# Patient Record
Sex: Female | Born: 1955
Health system: Southern US, Community
[De-identification: ages and names within clinical notes are randomized; demographics above are authoritative.]

## PROBLEM LIST (undated history)

## (undated) DIAGNOSIS — I10 Essential (primary) hypertension: Secondary | ICD-10-CM

## (undated) DIAGNOSIS — Z9981 Dependence on supplemental oxygen: Secondary | ICD-10-CM

## (undated) DIAGNOSIS — E039 Hypothyroidism, unspecified: Secondary | ICD-10-CM

## (undated) DIAGNOSIS — M419 Scoliosis, unspecified: Secondary | ICD-10-CM

## (undated) DIAGNOSIS — M1990 Unspecified osteoarthritis, unspecified site: Secondary | ICD-10-CM

## (undated) DIAGNOSIS — G43909 Migraine, unspecified, not intractable, without status migrainosus: Secondary | ICD-10-CM

## (undated) DIAGNOSIS — J449 Chronic obstructive pulmonary disease, unspecified: Secondary | ICD-10-CM

## (undated) DIAGNOSIS — D649 Anemia, unspecified: Secondary | ICD-10-CM

## (undated) DIAGNOSIS — Z8719 Personal history of other diseases of the digestive system: Secondary | ICD-10-CM

## (undated) DIAGNOSIS — B019 Varicella without complication: Secondary | ICD-10-CM

## (undated) DIAGNOSIS — R519 Headache, unspecified: Secondary | ICD-10-CM

## (undated) DIAGNOSIS — E119 Type 2 diabetes mellitus without complications: Secondary | ICD-10-CM

## (undated) DIAGNOSIS — M549 Dorsalgia, unspecified: Secondary | ICD-10-CM

## (undated) DIAGNOSIS — G8929 Other chronic pain: Secondary | ICD-10-CM

## (undated) DIAGNOSIS — J309 Allergic rhinitis, unspecified: Secondary | ICD-10-CM

## (undated) DIAGNOSIS — F32A Depression, unspecified: Secondary | ICD-10-CM

## (undated) DIAGNOSIS — C3490 Malignant neoplasm of unspecified part of unspecified bronchus or lung: Secondary | ICD-10-CM

## (undated) DIAGNOSIS — K219 Gastro-esophageal reflux disease without esophagitis: Secondary | ICD-10-CM

## (undated) DIAGNOSIS — E785 Hyperlipidemia, unspecified: Secondary | ICD-10-CM

## (undated) DIAGNOSIS — F419 Anxiety disorder, unspecified: Secondary | ICD-10-CM

## (undated) DIAGNOSIS — F329 Major depressive disorder, single episode, unspecified: Secondary | ICD-10-CM

## (undated) DIAGNOSIS — R51 Headache: Secondary | ICD-10-CM

## (undated) HISTORY — PX: ULNAR NERVE TRANSPOSITION: SHX2595

## (undated) HISTORY — DX: Allergic rhinitis, unspecified: J30.9

## (undated) HISTORY — DX: Type 2 diabetes mellitus without complications: E11.9

## (undated) HISTORY — DX: Other chronic pain: G89.29

## (undated) HISTORY — DX: Malignant neoplasm of unspecified part of unspecified bronchus or lung: C34.90

## (undated) HISTORY — DX: Hyperlipidemia, unspecified: E78.5

## (undated) HISTORY — DX: Depression, unspecified: F32.A

## (undated) HISTORY — DX: Essential (primary) hypertension: I10

## (undated) HISTORY — PX: APPENDECTOMY: SHX54

## (undated) HISTORY — DX: Dorsalgia, unspecified: M54.9

## (undated) HISTORY — DX: Personal history of other diseases of the digestive system: Z87.19

## (undated) HISTORY — DX: Varicella without complication: B01.9

## (undated) HISTORY — DX: Major depressive disorder, single episode, unspecified: F32.9

## (undated) HISTORY — PX: TONSILLECTOMY: SUR1361

## (undated) HISTORY — DX: Migraine, unspecified, not intractable, without status migrainosus: G43.909

## (undated) HISTORY — PX: TOTAL ABDOMINAL HYSTERECTOMY: SHX209

## (undated) HISTORY — PX: CARPAL TUNNEL RELEASE: SHX101

## (undated) HISTORY — DX: Anxiety disorder, unspecified: F41.9

## (undated) HISTORY — PX: TOTAL HIP ARTHROPLASTY: SHX124

## (undated) HISTORY — PX: CHOLECYSTECTOMY: SHX55

---

## 1989-01-28 HISTORY — PX: GALLBLADDER SURGERY: SHX652

## 2007-11-05 ENCOUNTER — Ambulatory Visit (HOSPITAL_COMMUNITY): Admission: RE | Admit: 2007-11-05 | Discharge: 2007-11-05 | Payer: Self-pay | Admitting: Orthopedic Surgery

## 2008-03-17 ENCOUNTER — Inpatient Hospital Stay (HOSPITAL_COMMUNITY): Admission: RE | Admit: 2008-03-17 | Discharge: 2008-03-19 | Payer: Self-pay | Admitting: Orthopedic Surgery

## 2008-03-19 ENCOUNTER — Encounter: Payer: Self-pay | Admitting: Cardiology

## 2008-04-19 ENCOUNTER — Ambulatory Visit (HOSPITAL_COMMUNITY): Admission: RE | Admit: 2008-04-19 | Discharge: 2008-04-19 | Payer: Self-pay | Admitting: Orthopedic Surgery

## 2008-04-19 ENCOUNTER — Encounter (INDEPENDENT_AMBULATORY_CARE_PROVIDER_SITE_OTHER): Payer: Self-pay | Admitting: Orthopedic Surgery

## 2008-04-19 ENCOUNTER — Ambulatory Visit: Payer: Self-pay | Admitting: Vascular Surgery

## 2008-09-21 ENCOUNTER — Encounter: Payer: Self-pay | Admitting: Cardiology

## 2008-09-22 ENCOUNTER — Encounter: Payer: Self-pay | Admitting: Cardiology

## 2008-11-16 DIAGNOSIS — M8949 Other hypertrophic osteoarthropathy, multiple sites: Secondary | ICD-10-CM | POA: Insufficient documentation

## 2008-11-16 DIAGNOSIS — I10 Essential (primary) hypertension: Secondary | ICD-10-CM | POA: Insufficient documentation

## 2008-11-16 DIAGNOSIS — M159 Polyosteoarthritis, unspecified: Secondary | ICD-10-CM

## 2008-11-16 DIAGNOSIS — F411 Generalized anxiety disorder: Secondary | ICD-10-CM | POA: Insufficient documentation

## 2008-11-16 DIAGNOSIS — R0789 Other chest pain: Secondary | ICD-10-CM

## 2008-11-17 ENCOUNTER — Ambulatory Visit: Payer: Self-pay | Admitting: Cardiology

## 2008-11-17 ENCOUNTER — Encounter (INDEPENDENT_AMBULATORY_CARE_PROVIDER_SITE_OTHER): Payer: Self-pay | Admitting: *Deleted

## 2008-11-17 ENCOUNTER — Encounter: Payer: Self-pay | Admitting: Physician Assistant

## 2008-11-17 DIAGNOSIS — E785 Hyperlipidemia, unspecified: Secondary | ICD-10-CM

## 2008-11-23 ENCOUNTER — Ambulatory Visit: Payer: Self-pay | Admitting: Cardiovascular Disease

## 2008-11-23 ENCOUNTER — Inpatient Hospital Stay (HOSPITAL_BASED_OUTPATIENT_CLINIC_OR_DEPARTMENT_OTHER): Admission: RE | Admit: 2008-11-23 | Discharge: 2008-11-23 | Payer: Self-pay | Admitting: Cardiovascular Disease

## 2008-11-23 ENCOUNTER — Encounter (INDEPENDENT_AMBULATORY_CARE_PROVIDER_SITE_OTHER): Payer: Self-pay | Admitting: *Deleted

## 2008-12-12 ENCOUNTER — Ambulatory Visit: Payer: Self-pay | Admitting: Cardiology

## 2008-12-12 DIAGNOSIS — F17210 Nicotine dependence, cigarettes, uncomplicated: Secondary | ICD-10-CM

## 2010-02-18 ENCOUNTER — Encounter: Payer: Self-pay | Admitting: Orthopedic Surgery

## 2010-05-15 LAB — CBC
HCT: 31.1 % — ABNORMAL LOW (ref 36.0–46.0)
Hemoglobin: 13.2 g/dL (ref 12.0–15.0)
Hemoglobin: 10.2 g/dL — ABNORMAL LOW (ref 12.0–15.0)
Hemoglobin: 9.7 g/dL — ABNORMAL LOW (ref 12.0–15.0)
MCV: 89.6 fL (ref 78.0–100.0)
MCV: 88 fL (ref 78.0–100.0)
MCV: 88.9 fL (ref 78.0–100.0)
Platelets: 160 10*3/uL (ref 150–400)
Platelets: 172 10*3/uL (ref 150–400)
RBC: 4.46 MIL/uL (ref 3.87–5.11)
RBC: 3.28 MIL/uL — ABNORMAL LOW (ref 3.87–5.11)
RDW: 14.8 % (ref 11.5–15.5)
WBC: 9.5 10*3/uL (ref 4.0–10.5)
WBC: 10.8 10*3/uL — ABNORMAL HIGH (ref 4.0–10.5)

## 2010-05-15 LAB — BASIC METABOLIC PANEL
BUN: 11 mg/dL (ref 6–23)
BUN: 4 mg/dL — ABNORMAL LOW (ref 6–23)
BUN: 9 mg/dL (ref 6–23)
CO2: 31 mEq/L (ref 19–32)
CO2: 26 mEq/L (ref 19–32)
CO2: 28 mEq/L (ref 19–32)
Calcium: 9.4 mg/dL (ref 8.4–10.5)
Calcium: 7.6 mg/dL — ABNORMAL LOW (ref 8.4–10.5)
Chloride: 104 mEq/L (ref 96–112)
Chloride: 104 mEq/L (ref 96–112)
Creatinine, Ser: 0.82 mg/dL (ref 0.4–1.2)
GFR calc non Af Amer: >60 mL/min (ref >60)
GFR calc non Af Amer: >60 mL/min (ref >60)
Glucose, Bld: 105 mg/dL — ABNORMAL HIGH (ref 70–99)
Glucose, Bld: 121 mg/dL — ABNORMAL HIGH (ref 70–99)
Potassium: 2.9 mEq/L — ABNORMAL LOW (ref 3.5–5.1)
Sodium: 146 mEq/L — ABNORMAL HIGH (ref 135–145)
Sodium: 134 mEq/L — ABNORMAL LOW (ref 135–145)
Sodium: 137 mEq/L (ref 135–145)

## 2010-05-15 LAB — TYPE AND SCREEN
ABO/RH(D): O NEG
Antibody Screen: NEGATIVE

## 2010-05-15 LAB — URINALYSIS, ROUTINE W REFLEX MICROSCOPIC: Bilirubin Urine: NEGATIVE

## 2010-05-15 LAB — DIFFERENTIAL
Basophils Absolute: 0.1 10*3/uL (ref 0.0–0.1)
Basophils Relative: 1 % (ref 0–1)
Eosinophils Absolute: 1.3 10*3/uL — ABNORMAL HIGH (ref 0.0–0.7)
Lymphocytes Relative: 30 % (ref 12–46)
Neutrophils Relative %: 48 % (ref 43–77)

## 2010-05-15 LAB — APTT: aPTT: 29 seconds (ref 24–37)

## 2010-06-12 NOTE — Op Note (Signed)
[name], [name] NO.:  [id number]   MEDICAL RECORD NO.:  [id number]          PATIENT TYPE:  INP   LOCATION:  0001                         FACILITY:  [facility]   PHYSICIAN:  Madlyn Frankel. Charlann Boxer, M.D.  DATE OF BIRTH:  30-Dec-1955   DATE OF PROCEDURE:  03/17/2008  DATE OF DISCHARGE:                               OPERATIVE REPORT   PREOPERATIVE DIAGNOSIS:  Failed right total hip replacement with  persistent pain.   POSTOPERATIVE DIAGNOSIS:  Failed right total hip replacement with  persistent pain.   FINDINGS:  There was no concern for infection, synovial fluid appeared  normal.  Intraoperative findings did find a vertically oriented  acetabular shell at least 50 degrees of abduction if not more with only  probably 5 degrees of anteversion.  Forward flexion in the femoral stem  was very stable.  There was no evidence of trochanteric fracture of  mobility.  There was no evidence of any micromotion with gross motion of  the stem.   PROCEDURE:  Revision right total hip replacement with a 56 Gription  pinnacle cup, two cancellous screws and a 40 mm neutral liner and a 40+  8.5 Delta ceramic ball.   SURGEON:  Dr. Charlann Boxer.   ASSISTANT:  Dwyane Luo, PA-C.   ANESTHESIA:  General.   BLOOD LOSS:  250.   DRAINS:  One Hemovac.   COMPLICATIONS:  None.   INDICATIONS FOR PROCEDURE:  [name] is 55 year old female I have seen  in evaluation for persistent right hip pain after a hip replacements was  placed in 2006.  She has never really felt relief from her hip pain.  She reports both pain in the groin down into the thigh area and workup  for infection which was negative.  Bone scan indicated some uptake in  the proximal aspect of the hip area.  There was no evidence of obvious  loosening of the femoral stem.  I had an extensive discussion with Ms.  [name] in the office and then earlier in the night before surgery  about the plan.  I was a little bit worried a 55 year old  female  removing a well fixed fully porous coated prosthesis with potential  destruction of the bone.  We initially thought that removing the  prosthesis would be of benefit to try to place a proximal coasted  prosthesis if that there was concern about loosening.  The initial  workup was concerning for femoral stem loosening.  After extensive  discussion about the potential plans and the risks and benefits, the  risks of infection, DVT, dislocation, need for revision surgery,  persistent pain were all discussed and reviewed and consent was  obtained.   PROCEDURE IN DETAIL:  The patient was brought to the operative theater.  Once adequate anesthesia and preoperative antibiotics, clindamycin  administered, the patient was positioned in left lateral decubitus  position with the right-side up.  The right lower extremity was then pre-  scrubbed and prepped and draped in a sterile fashion.  A timeout  performed identifying the patient and the extremity.  The patient was  noted to have a pretty much laterally oriented incision. I used a  portion of this, but also extended a little bit more posterior to help  with exposure from the revision stem, particularly on the acetabular  side.  Sharp dissection was carried down to the iliotibial band and  gluteal fascia.  Initially, I exposed the hip from a posterior approach  as opposed to full expose of the femur.  The pseudocapsular tissue was  taken down off the proximal posterior aspect of femur down to the level  of the lesser trochanter.  Inside the joint, the joint fluid was normal.  There was noted to be debulking of the entire synovium inside the hip  joint.   There was no significant debris.   It was immediately evident upon entering the hip joint that the hip had  little to mod anteversion.  The femoral stem had about 5 degrees of  anteversion and the acetabulum cup about the same.  The acetabular cup  was noted to be vertically oriented.   At this point, the hip was  dislocated easily and the femoral head was removed.  Based on the  dissection and debridement, I was able to place the trunnion of the stem  onto the proximal anterior aspect of the ilium.  With this, I had  adequate visualization of the rim of the acetabulum.  I checked with the  hip guidance confirming that there was only about 5 degrees of forward  flexion, as well as a significantly oriented, at least 50-55 degree  oriented cup.   At this point, using a DePuy pinnacle suction cup with a metal liner, I  used a bone tamp and wrapped the outside of the cup associating the  metal taper.  The liner was removed.  The hole eliminator were removed  and a 32 neutral liner placed.  With this, I used the Zimmer Explant  system with the short and long blades to removed the cup.  The cup was  removed with very little bone loss.   At this point, I began reaming with a 45 reamer.  I wanted to ream down  a little bit medial.  Then I reamed up sequentially until the 54 reamer  where I thought the 55 mm gave me the best fit.  I was really worried  that the 53 reamer and 54 cup may not have the best purchase and my  final reamings removed very little bone.  The final 56 pinnacle cup was  then impacted.  With the new orientation, there was at least 20-25  degrees of forward flexion beneath the anterior rim anteriorly with a  portion of the cup exposed superolateral.   With the cup in its new orientation, I placed two screws and then placed  a neutral 41 liner and a trial.  At this point, the clinical decision  was made to not remove the femoral stem as there was no motion of the  stem.  The femur appeared  to be well-fixed with at least  5 degrees of  anteversion.  I thought her cup position could have been the source for  her paindue to paossible impingement and complications due to edge  loading.  A trial reduction at this point revealed a combined  anteversion of 40-45  degrees.  The hip was very stable.  Initially, I  trialed with a 1.5 ball, but there was a few millimeters of shuck with  this even in extension.  I  went up to a +5, and there still a few  millimeters of shuck.  At this point, I removed the trial components  including the trial liner.  A 40 mm neutral metal liner was then  impacted after placement of the new hole eliminator onto a dried shell.  I re-trialed at this point and ended up choosing a Delta ceramic ball  40+ 8.5, for a hard bearing surface, but to try to eliminate metal-on-  metal in this edge-loading phenomenon.  At this point with the ceramic  on metal insert, hopefully there will be less ion debris and also  provide her with a hip that may last her the longest time as possible.  The final ball was impacted on a dry trunnion and the hip reduced.  The  hip was irrigated throughout the case.  At this point, we had used Cell  Saver and in an attempt to salvage any blood if it was needed.  There  was only 250 mL of blood loss and no salvageable blood return.  I  reapproximated some of the pseudocapsule to the superior aspect of  itself using #1 Vicryl.  I placed a medium Hemovac drain deep.  I then  reapproximated the iliotibial band and gluteal fascia using #1 Vicryl  and used  2-0 Vicryl on the subcu layer and 4-0 running Monocryl.  The hip was  cleaned, dried and dressed sterilely with Steri-Strips and Mepilex  dressing.  She was brought to the recovery room and extubated in stable  condition.  She tolerated the procedure well.      Madlyn Frankel Charlann Boxer, M.D.  Electronically Signed     MDO/MEDQ  D:  03/17/2008  T:  03/17/2008  Job:  [id number]

## 2010-06-12 NOTE — Discharge Summary (Signed)
[name], [name]   MEDICAL RECORD NO.:  [id number]          PATIENT TYPE:  INP   LOCATION:  1608                         FACILITY:  [facility]   PHYSICIAN:  Madlyn Frankel. Charlann Boxer, M.D.  DATE OF BIRTH:  1955/05/07   DATE OF ADMISSION:  03/17/2008  DATE OF DISCHARGE:  03/19/2008                               DISCHARGE SUMMARY   ADMITTING DIAGNOSES:  1. Osteoarthritis.  2. Hypertension.  3. Anxiety.  4. Migraines.  5. Urinary incontinence.  6. Postmenopausal.   DISCHARGE DIAGNOSES:  1. Osteoarthritis.  2. Hypertension.  3. Anxiety.  4. Migraines.  5. Urinary incontinence.  6. Postmenopausal.  7. Postoperative hypokalemia.   HISTORY OF PRESENT ILLNESS:  A 55 year old female with a history of  right total hip placement with evidence of loosening.  Refractory to all  conservative treatment.   CONSULTS:  None.   PROCEDURE:  Revision right total hip replacement, surgeon Durene Romans,  MD, assistant Dwyane Luo PA-C.   LABORATORY DATA:  CBC final reading white blood cell 10.8, hematocrit  29.2, platelets 160.  White cell differential:  No significant  abnormalities.  Coagulation normal.  Metabolic panel final reading:  Sodium 134, potassium 2.9, glucose 128, BUN 4, creatinine 0.82.  Calcium  was 7.6.  UA was negative.   CARDIOLOGY:  Electrocardiogram normal sinus rhythm.   RADIOLOGY:  Chest two-view:  No acute disease.  Portable pelvis:  Revision of a right total hip without complication.   HOSPITAL COURSE:  The patient underwent revision right total hip  placement, admitted to orthopedic floor.  Her stay was unremarkable.  Dressing was changed with no significant drainage from the wound.  She  was partial weightbearing 50% with improving strength and ambulation.  She remained orthopedically stable.  She had a low potassium which was  replaced.  By the 20th she was stable, making good progress and ready  for discharge home.   DISCHARGE  DISPOSITION:  Discharged home with home health care PT in  stable and improved condition.   DISCHARGE DIET:  Heart-healthy.   DISCHARGE WOUND CARE:  Keep dry.   DISCHARGE PHYSICAL THERAPY:  Partial weightbearing 50% with a rolling  walker.   DISCHARGE MEDICATIONS:  1. Aspirin 325 mg p.o. 2 times daily.  2. Robaxin 500 mg p.o. q.6.  3. Iron 325 mg p.o. 3 times daily.  4. Colace 100 mg p.o. 2 times daily.  5. MiraLax 17 grams p.o. daily.  6. Norco 5/325 one to two p.o. q.4-6 p.r.n. pain.  7. K-Dur 20 mEq p.o. daily x2 weeks.  8. Alprazolam 0.5 mg p.r.n.  9. Omeprazole 20 mg p.o. daily.  10.Triamterene/HCTZ 37.5/25 one p.o. daily.  11.Estropipate 1.5 mg p.o. daily.  12.Vitamin C 500 mg p.o. daily.   DISCHARGE FOLLOWUP:  Follow up with Dr. Charlann Boxer at phone number [phone] in  2 weeks for wound check.     ______________________________  Yetta Glassman. [address], [place]      Madlyn Frankel. Charlann Boxer, M.D.  Electronically Signed    BLM/MEDQ  D:  04/11/2008  T:  04/11/2008  Job:  [id number]  cc:   Wyvonnia Lora  Fax: [phone]

## 2010-06-15 NOTE — H&P (Signed)
[name], [name]   MEDICAL RECORD NO.:  [id number]          PATIENT TYPE:  INP   LOCATION:                               FACILITY:  [facility]   PHYSICIAN:  Madlyn Frankel. Charlann Boxer, M.D.  DATE OF BIRTH:  [birth year]   DATE OF ADMISSION:  03/17/2008  DATE OF DISCHARGE:                              HISTORY & PHYSICAL   DATE OF ADMISSION:  March 17, 2008.   OPERATION/PROCEDURE:  Revision right total hip replacement.   CHIEF COMPLAINTS:  Right hip pain due to failed right total hip  replacement.   HISTORY OF PRESENT ILLNESS:  A 55 year old female with a history of  right total hip replacement which is painful with evidence of loosening.  Been refractory to all conservative treatment.   MEDICAL HISTORY:  1. Osteoarthritis.  2. Hypertension.  3. Anxiety.  4. Migraines.  5. Urinary incontinence.  6. Postmenopausal.   PAST SURGERIES:  1. Tonsillectomy.  2. Tubal ligation.  3. Hysterectomy.  4. Cholecystectomy.  5. Left ovary removed in 2003.  6. Primary hip replacement in 2006.   FAMILY HISTORY:  Osteoarthritis, congestive heart failure, COPD, CVA.   SOCIAL HISTORY:  Married.  Smokes 1-1/2  to 2 packs per day for the past  25 years which will be an approximate 50 pack-year history.  Primary  caregiver will be her husband at home postoperatively.   ALLERGIES:  PENICILLIN, CODEINE, SULFA, unknown reaction.   CURRENT MEDICATIONS:  1. Triamterene/HCTZ 37.5/25 one p.o. daily.  2. Estrapade 1.5 daily.  3. Omprazole 20 mg one p.o. daily.  4. Darvocet 1 to 2 p.o. t.i.d.  5. Xanax 0.25 mg p.o. p.r.n.Marland Kitchen   REVIEW OF SYSTEMS:  MUSCULOSKELETAL:  She does have left foot which is  painful.  GENITOURINARY:  She has incontinence issues.  Otherwise see  HPI.   PHYSICAL EXAMINATION:  VITAL SIGNS:  Pulse 72, respirations 16, blood  pressure 130/84.  GENERAL:  Awake, alert and oriented.  HEENT: Normocephalic.  NECK:  Supple.  No carotid bruits.  CHEST/LUNGS:  Clear to auscultation bilaterally.  BREASTS:  Deferred.  HEART:  Regular rate and rhythm.  S1 and S2 distinct.  ABDOMEN:  Soft, bowel sounds present.  PELVIS:  Stable.  GENITOURINARY:  Deferred.  EXTREMITIES:  Right hip has increased pain with range of motion.  SKIN:  No cellulitis.  NEUROLOGIC:  Intact distal sensibilities.   LABORATORY DATA:  EKG, chest x-ray all pending presurgical testing.   IMPRESSION:  Failed right total hip replacement.   PLAN OF ACTION:  Revision right total hip replacement at [facility] by surgeon Dr. Durene Romans on March 17, 2008.  Risks and  complications were discussed.   Postop medications were provided including aspirin for postoperative DVT  prophylaxis.     ______________________________  Yetta Glassman [address], [place]      Madlyn Frankel. Charlann Boxer, M.D.  Electronically Signed    BLM/MEDQ  D:  02/24/2008  T:  02/24/2008  Job:  [id number]   cc:   Wyvonnia Lora  Fax: [phone]

## 2011-02-10 ENCOUNTER — Encounter (HOSPITAL_COMMUNITY): Payer: Self-pay | Admitting: *Deleted

## 2011-02-10 ENCOUNTER — Emergency Department (HOSPITAL_COMMUNITY)
Admission: EM | Admit: 2011-02-10 | Discharge: 2011-02-10 | Disposition: A | Discharge status: Home / Self Care | Payer: PRIVATE HEALTH INSURANCE | Attending: Emergency Medicine | Admitting: Emergency Medicine

## 2011-02-10 DIAGNOSIS — M545 Low back pain, unspecified: Secondary | ICD-10-CM | POA: Insufficient documentation

## 2011-02-10 DIAGNOSIS — I10 Essential (primary) hypertension: Secondary | ICD-10-CM | POA: Insufficient documentation

## 2011-02-10 DIAGNOSIS — M25551 Pain in right hip: Secondary | ICD-10-CM

## 2011-02-10 DIAGNOSIS — Z79899 Other long term (current) drug therapy: Secondary | ICD-10-CM | POA: Insufficient documentation

## 2011-02-10 DIAGNOSIS — S335XXA Sprain of ligaments of lumbar spine, initial encounter: Secondary | ICD-10-CM | POA: Insufficient documentation

## 2011-02-10 DIAGNOSIS — M25559 Pain in unspecified hip: Secondary | ICD-10-CM | POA: Insufficient documentation

## 2011-02-10 DIAGNOSIS — T733XXA Exhaustion due to excessive exertion, initial encounter: Secondary | ICD-10-CM | POA: Insufficient documentation

## 2011-02-10 DIAGNOSIS — S39012A Strain of muscle, fascia and tendon of lower back, initial encounter: Secondary | ICD-10-CM

## 2011-02-10 DIAGNOSIS — M1990 Unspecified osteoarthritis, unspecified site: Secondary | ICD-10-CM | POA: Insufficient documentation

## 2011-02-10 HISTORY — DX: Unspecified osteoarthritis, unspecified site: M19.90

## 2011-02-10 MED ORDER — METHOCARBAMOL 750 MG PO TABS: ORAL_TABLET | ORAL | Status: DC

## 2011-02-10 NOTE — ED Notes (Signed)
Pt states has a history of chronic back pain, is prescribed hydrocodone 10/650 mg from pain clinic and reports pain medication has not touched back pain today. Pt reports walking up stadium steps Friday and back has been aggravated since. Pain radiates to rt leg.

## 2011-02-10 NOTE — ED Provider Notes (Signed)
History     CSN: [id number]  Arrival date & time 02/10/11  1113   First MD Initiated Contact with Patient 02/10/11 1251      Chief Complaint  Patient presents with  . Back Pain    (Consider location/radiation/quality/duration/timing/severity/associated sxs/prior treatment) HPI Comments: Pt has had low back pain and R hip pain since climbing some bleachers recently at a sporting event.  No trauma.  Patient is a 56 y.o. female presenting with back pain. The history is provided by the patient and the spouse. No language interpreter was used.  Back Pain  This is a new problem. The problem occurs constantly. The pain is present in the lumbar spine.    Past Medical History  Diagnosis Date  . Back pain   . Osteoarthritis   . Status post hip replacement   . Hypertension     Past Surgical History  Procedure Date  . Hip surgery     with hip replacement and revision    History reviewed. No pertinent family history.  History  Substance Use Topics  . Smoking status: Current Everyday Smoker  . Smokeless tobacco: Not on file  . Alcohol Use: No    OB History    Grav Para Term Preterm Abortions TAB SAB Ect Mult Living                  Review of Systems  Musculoskeletal: Positive for back pain.       Hip pain  All other systems reviewed and are negative.    Allergies  Penicillins and Sulfonamide derivatives  Home Medications   Current Outpatient Rx  Name Route Sig Dispense Refill  . ALPRAZOLAM 1 MG PO TABS Oral Take 1 mg by mouth 4 (four) times daily.    Marland Kitchen CITALOPRAM HYDROBROMIDE 40 MG PO TABS Oral Take 40 mg by mouth daily.    Marland Kitchen HYDROCODONE-ACETAMINOPHEN 10-650 MG PO TABS Oral Take 1 tablet by mouth every 6 (six) hours as needed. Pain    . IBUPROFEN 200 MG PO TABS Oral Take 400 mg by mouth every 6 (six) hours as needed. Pain    . NABUMETONE 500 MG PO TABS Oral Take 500 mg by mouth 2 (two) times daily.    Marland Kitchen OMEPRAZOLE 20 MG PO CPDR Oral Take 20 mg by mouth daily.     . TRIAMTERENE-HCTZ 37.5-25 MG PO CAPS Oral Take 1 capsule by mouth every morning.      BP 130/80  Pulse 64  Temp(Src) 98.3 F (36.8 C) (Oral)  Resp 20  Ht 5\' 5"  (1.651 m)  Wt 200 lb (90.719 kg)  BMI 33.28 kg/m2  SpO2 95%  Physical Exam  Nursing note and vitals reviewed. Constitutional: She is oriented to person, place, and time. She appears well-developed and well-nourished. No distress.  HENT:  Head: Normocephalic and atraumatic.  Eyes: EOM are normal.  Neck: Normal range of motion.  Cardiovascular: Normal rate, regular rhythm and normal heart sounds.   Pulmonary/Chest: Effort normal and breath sounds normal.  Abdominal: Soft. She exhibits no distension. There is no tenderness.  Musculoskeletal: She exhibits tenderness.       Right hip: She exhibits decreased range of motion. She exhibits no tenderness, no bony tenderness, no swelling, no crepitus, no deformity and no laceration.       Arms:      Legs: Neurological: She is alert and oriented to person, place, and time.  Skin: Skin is warm and dry.  Psychiatric: She has a  normal mood and affect. Judgment normal.    ED Course  Procedures (including critical care time)  Labs Reviewed - No data to display No results found.   No diagnosis found.    MDM          Worthy Rancher, PA 02/10/11 1343

## 2011-02-10 NOTE — ED Notes (Signed)
Pt states that she has chronic back problems but pain became worse Friday evening, denies any new injury,

## 2011-02-11 NOTE — ED Provider Notes (Signed)
Medical screening examination/treatment/procedure(s) were performed by non-physician practitioner and as supervising physician I was immediately available for consultation/collaboration.   Joya Gaskins, MD 02/11/11 2052

## 2012-08-20 ENCOUNTER — Ambulatory Visit (INDEPENDENT_AMBULATORY_CARE_PROVIDER_SITE_OTHER): Payer: Managed Care, Other (non HMO) | Admitting: Orthopedic Surgery

## 2012-08-20 ENCOUNTER — Encounter: Payer: Self-pay | Admitting: Orthopedic Surgery

## 2012-08-20 DIAGNOSIS — M6530 Trigger finger, unspecified finger: Secondary | ICD-10-CM | POA: Insufficient documentation

## 2012-08-20 DIAGNOSIS — M7070 Other bursitis of hip, unspecified hip: Secondary | ICD-10-CM | POA: Insufficient documentation

## 2012-08-20 DIAGNOSIS — M7071 Other bursitis of hip, right hip: Secondary | ICD-10-CM

## 2012-08-20 DIAGNOSIS — M76899 Other specified enthesopathies of unspecified lower limb, excluding foot: Secondary | ICD-10-CM

## 2012-08-20 NOTE — Progress Notes (Signed)
Patient ID: [name], female   DOB: [birth year], 56 y.o.   MRN: [id number] Chief Complaint  Patient presents with  . Hand Pain    She is having triggering of the right long finger with catching  . Hip Pain    Gen. revision right total hip and complains of a snapping hip     History the patient had a total hip and then had a revision by Dr. Charlann [name] in [place] at [facility] where she usually gets most of her orthopedic care. She was in the office with her son and complained of a snapping hip and a triggering finger and I offered to help her with that and she agreed. She has locking and catching and pain over the A1 pulley of the right middle finger. She has snapping hip sensation on the lateral side of the hip every time she walks she does not complaining of groin pain back pain or buttock pain. Her pain in the finger has reached 8/10 it is associated with locking  As far as the hip goes test is described as sharp and burning laterally  Review of systems history diarrhea, numbness and tingling. Anxiety and depression. Seasonal allergies. Joint pain, instability muscle pain. All the systems were reviewed and were normal.  Past Medical History  Diagnosis Date  . Back pain   . Osteoarthritis   . Status post hip replacement   . Hypertension    Past Surgical History  Procedure Laterality Date  . Hip surgery      with hip replacement and revision   The past, family history and social history have been reviewed and are recorded in the corresponding sections of epic   General appearance the patient has mild obesity otherwise normal appearance, oriented x3, mood and affect normal, she ambulates with a somewhat altered gait and I cannot palpate any snapping as I walked down the hall with her with my hand over her hip. Inspection of the hip reveals 2 incisions they interconnect distally and separated proximally. She is tender over the greater trochanter just above these incisions. Hip  range of motion does not seem to be affected except for extreme adduction. Push pull test indicates hip stability. Muscle tone strength normal. Skin incision is well-healed. Normal pulse and temperature in the foot sensation normal.  Right long finger tenderness over the A1 pulley catching and locking demonstrated by flexion extension. Flexion of the digit shows intact flexor tendons PIP and PIP. Skin is intact. No joint instability. Color is good sensation is normal   Encounter Diagnoses  Name Primary?  . Trigger finger, acquired Yes  . Hip bursitis, right     I injected both areas I told her if her hip injection does not help she can see Dr. Charlann [name] at that point but otherwise I think this should give her good relief. I also injected her trigger finger.  Right hip injection Hip Injection Verbal consent was obtained  Timeout procedure was completed  to confirm her right hip  Alcohol was used as a prep with ethyl chloride as an anesthetizing agent  Using a 22-gauge spinal needle the greater trochanter bursa was entered and injected with Depo-Medrol 40 mg.  We also diluted out with 3 cc 1% plain lidocaine   Trigger finger injection Diagnosis trigger finger Postop diagnosis trigger finger Procedure injection of trigger finger Finger injected RIGHT long finger  Details of procedure: After verbal consent and timeout to confirm site the RIGHT long finger was injected  with 1 cc of 40 mg of Depo-Medrol and 1 cc of 1% lidocaine The procedure was tolerated well without complication

## 2012-08-20 NOTE — Patient Instructions (Signed)
You have received a steroid shot. 15% of patients experience increased pain at the injection site with in the next 24 hours. This is best treated with ice and tylenol extra strength 2 tabs every 8 hours. If you are still having pain please call the office.  Trigger Finger Trigger finger (digital tendinitis and stenosing tenosynovitis) is a common disorder that causes an often painful catching of the fingers or thumb. It occurs as a clicking, snapping or locking of a finger in the palm of the hand. The reason for this is that there is a problem with the tendons which flex the fingers sliding smoothly through their sheaths. The cause of this may be inflammation of the tendon and sheath, or from a thickening or nodule in the tendon. The condition may occur in any finger or a couple fingers at the same time. The cause may be overuse while doing the same activity over and over again with your hands.  Tendons are the tough cords that connect the muscles to bones. Muscles and tendons are part of the system which allows your body to move. When muscles contract in the forearm on the palm side, they pull the tendons toward the elbow and cause the fingers and thumb to bend (flex) toward the palm. These are the flexor tendons. The tendons slide through a slippery smooth membrane (synovium) which is called the tendon sheath. The sheaths have areas of tough fibrous tissues surrounding them which hold the tendons close to the bone. These are called pulleys because they work like a pulley. The first pulley is in the palm of the hand near the crease which runs across your palm. If the area of the tendon thickening is near the pulley, the tendon cannot slide smoothly through the pulley and this causes the trigger finger. The finger may lock with the finger curled or suddenly straighten out with a snap. This is more common in patients with rheumatoid arthritis and diabetes. Left untreated, the condition may get worse to the point  where the finger becomes locked in flexion, like making a fist, or less commonly locked with the finger straightened out. DIAGNOSIS  Your caregiver will easily make this diagnosis on examination. TREATMENT   Splinting for 6 to 8 weeks of time may be helpful. Use the splints as your caregiver suggests.  Heat used for twenty minutes at least four times a day followed by ice packs for twenty minutes unless directed otherwise by your caregiver may be helpful. If you find either heat or cold seems to be making the problem worse, quit using them and ask your caregiver for directions.  Cortisone injections along with splinting may speed up recovery. Several injections may be required. Cortisone may give relief after one injection.  Only take over-the-counter or prescription medicines for pain, discomfort, or fever as directed by your caregiver.  Surgery is another treatment that may be used if conservative treatments using injection and splinting does not work. Surgery can be minor without incisions (a cut does not have to be made) and can be done with a needle through the skin. No stitches are needed and most patients may return to work the same day.  Other surgical choices involve an open procedure where the surgeon opens the hand through a small incision (cut) and cuts the pulley so the tendon can again slide smoothly. Your hand will still work fine. This small operation requires stitches and the recovery will be a little longer and the incisions will need  to be protected until completely healed. You may have to limit your activities for up to 6 months.  Occupational or hand therapy may be required if there is stiffness remaining in the finger. RISKS AND COMPLICATIONS Complications are uncommon but some problems that may occur are:  Recurrence of the trigger finger. This does not mean that the surgery was not well done. It simply means that you may have formed scar tissue following surgery that  causes the problem to reoccur.  Infection which could ruin the results of the surgery and can result in a finger which is frozen and can not move normally.  Nerve injury is possible which could result in permanent numbness of one or more fingers. CARE AFTER SURGERY  Elevate your hand above your heart and use ice as instructed.  Follow instructions regarding finger motion/exercise.  Keep the surgical wound dry for at least 48 hrs or longer if instructed.  Keep your follow-up appointments.  Return to work and normal activities as instructed. SEEK IMMEDIATE MEDICAL CARE IF:  Your problems are getting worse or you do not obtain relief from the treatment. Document Released: 11/04/2003 Document Revised: 04/08/2011 Document Reviewed: 06/28/2008 [facility] Patient Information 2014 [place], [place]. Hip Bursitis Bursitis is a swelling and soreness (inflammation) of a fluid-filled sac (bursa). This sac overlies and protects the joints.  CAUSES   Injury.  Overuse of the muscles surrounding the joint.  Arthritis.  Gout.  Infection.  Cold weather.  Inadequate warm-up and conditioning prior to activities. The cause may not be known.  SYMPTOMS   Mild to severe irritation.  Tenderness and swelling over the outside of the hip.  Pain with motion of the hip.  If the bursa becomes infected, a fever may be present. Redness, tenderness, and warmth will develop over the hip. Symptoms usually lessen in 3 to 4 weeks with treatment, but can come back. TREATMENT If conservative treatment does not work, your caregiver may advise draining the bursa and injecting cortisone into the area. This may speed up the healing process. This may also be used as an initial treatment of choice. HOME CARE INSTRUCTIONS   Apply ice to the affected area for 15-20 minutes every 3 to 4 hours while awake for the first 2 days. Put the ice in a plastic bag and place a towel between the bag of ice and your skin.  Rest  the painful joint as much as possible, but continue to put the joint through a normal range of motion at least 4 times per day. When the pain lessens, begin normal, slow movements and usual activities to help prevent stiffness of the hip.  Only take over-the-counter or prescription medicines for pain, discomfort, or fever as directed by your caregiver.  Use crutches to limit weight bearing on the hip joint, if advised.  Elevate your painful hip to reduce swelling. Use pillows for propping and cushioning your legs and hips.  Gentle massage may provide comfort and decrease swelling. SEEK IMMEDIATE MEDICAL CARE IF:   Your pain increases even during treatment, or you are not improving.  You have a fever.  You have heat and inflammation over the involved bursa.  You have any other questions or concerns. MAKE SURE YOU:   Understand these instructions.  Will watch your condition.  Will get help right away if you are not doing well or get worse. Document Released: 07/06/2001 Document Revised: 04/08/2011 Document Reviewed: 02/03/2008 [facility] Patient Information 2014 [place], [place].

## 2013-03-31 ENCOUNTER — Other Ambulatory Visit (HOSPITAL_COMMUNITY): Payer: Self-pay | Admitting: Orthopedic Surgery

## 2013-03-31 DIAGNOSIS — M25561 Pain in right knee: Secondary | ICD-10-CM

## 2013-04-08 ENCOUNTER — Encounter (HOSPITAL_COMMUNITY)
Admission: RE | Admit: 2013-04-08 | Discharge: 2013-04-08 | Disposition: A | Discharge status: Home / Self Care | Payer: 59 | Source: Ambulatory Visit | Attending: Orthopedic Surgery | Admitting: Orthopedic Surgery

## 2013-04-08 ENCOUNTER — Ambulatory Visit (HOSPITAL_COMMUNITY)
Admission: RE | Admit: 2013-04-08 | Discharge: 2013-04-08 | Disposition: A | Discharge status: Home / Self Care | Payer: 59 | Source: Ambulatory Visit | Attending: Orthopedic Surgery | Admitting: Orthopedic Surgery

## 2013-04-08 DIAGNOSIS — M25559 Pain in unspecified hip: Secondary | ICD-10-CM | POA: Insufficient documentation

## 2013-04-08 DIAGNOSIS — Z96649 Presence of unspecified artificial hip joint: Secondary | ICD-10-CM | POA: Insufficient documentation

## 2013-04-08 DIAGNOSIS — M25561 Pain in right knee: Secondary | ICD-10-CM

## 2013-04-08 MED ORDER — TECHNETIUM TC 99M MEDRONATE IV KIT
25.0000 | PACK | Freq: Once | INTRAVENOUS | Status: AC | PRN
  Administered 2013-04-08: 25 via INTRAVENOUS

## 2013-12-14 ENCOUNTER — Telehealth: Payer: Self-pay | Admitting: Oncology

## 2013-12-14 NOTE — Telephone Encounter (Signed)
LEFT MESSAGE FOR PATIENT TO RETURN CALL TO SCHEDULE NP APPT.  °

## 2013-12-21 ENCOUNTER — Telehealth: Payer: Self-pay | Admitting: Oncology

## 2013-12-21 NOTE — Telephone Encounter (Signed)
S/W PATIENT AND GAVE NP APPT FOR 12/02 @ 1:30 W/DR. SHADAD.  REFERRING DR. Jori Moll GIOFFRE DX- MYELOMA C/D ON 11/24 FOR NP APPT ON 12/02

## 2013-12-23 ENCOUNTER — Other Ambulatory Visit: Payer: Self-pay | Admitting: Oncology

## 2013-12-23 DIAGNOSIS — D729 Disorder of white blood cells, unspecified: Secondary | ICD-10-CM

## 2013-12-29 ENCOUNTER — Ambulatory Visit: Payer: 59

## 2013-12-29 ENCOUNTER — Other Ambulatory Visit (HOSPITAL_BASED_OUTPATIENT_CLINIC_OR_DEPARTMENT_OTHER): Payer: 59

## 2013-12-29 ENCOUNTER — Encounter (INDEPENDENT_AMBULATORY_CARE_PROVIDER_SITE_OTHER): Payer: Self-pay

## 2013-12-29 ENCOUNTER — Telehealth: Payer: Self-pay | Admitting: Oncology

## 2013-12-29 ENCOUNTER — Encounter: Payer: Self-pay | Admitting: Oncology

## 2013-12-29 ENCOUNTER — Ambulatory Visit (HOSPITAL_BASED_OUTPATIENT_CLINIC_OR_DEPARTMENT_OTHER): Payer: 59 | Admitting: Oncology

## 2013-12-29 VITALS — BP 131/62 | HR 80 | Temp 97.9°F | Resp 18 | Ht 65.0 in | Wt 222.7 lb

## 2013-12-29 DIAGNOSIS — D472 Monoclonal gammopathy: Secondary | ICD-10-CM

## 2013-12-29 DIAGNOSIS — D729 Disorder of white blood cells, unspecified: Secondary | ICD-10-CM

## 2013-12-29 LAB — CBC WITH DIFFERENTIAL/PLATELET
BASO%: 0.8 % (ref 0.0–2.0)
BASOS ABS: 0.1 10*3/uL (ref 0.0–0.1)
EOS ABS: 0.3 10*3/uL (ref 0.0–0.5)
EOS%: 2.4 % (ref 0.0–7.0)
HCT: 39.6 % (ref 34.8–46.6)
HGB: 12.5 g/dL (ref 11.6–15.9)
LYMPH%: 26.8 % (ref 14.0–49.7)
MCH: 27.4 pg (ref 25.1–34.0)
MCHC: 31.5 g/dL (ref 31.5–36.0)
MCV: 86.8 fL (ref 79.5–101.0)
MONO#: 1 10*3/uL — ABNORMAL HIGH (ref 0.1–0.9)
MONO%: 8.6 % (ref 0.0–14.0)
NEUT%: 61.4 % (ref 38.4–76.8)
NEUTROS ABS: 7.1 10*3/uL — AB (ref 1.5–6.5)
PLATELETS: 277 10*3/uL (ref 145–400)
RBC: 4.56 10*6/uL (ref 3.70–5.45)
RDW: 16.1 % — ABNORMAL HIGH (ref 11.2–14.5)
WBC: 11.6 10*3/uL — ABNORMAL HIGH (ref 3.9–10.3)
lymph#: 3.1 10*3/uL (ref 0.9–3.3)

## 2013-12-29 LAB — COMPREHENSIVE METABOLIC PANEL (CC13)
ALK PHOS: 78 U/L (ref 40–150)
ALT: 9 U/L (ref 0–55)
AST: 12 U/L (ref 5–34)
Albumin: 3.6 g/dL (ref 3.5–5.0)
Anion Gap: 11 mEq/L (ref 3–11)
BUN: 23.4 mg/dL (ref 7.0–26.0)
CO2: 26 mEq/L (ref 22–29)
Calcium: 8.8 mg/dL (ref 8.4–10.4)
Chloride: 106 mEq/L (ref 98–109)
Creatinine: 1.1 mg/dL (ref 0.6–1.1)
Glucose: 85 mg/dl (ref 70–140)
Potassium: 3.6 mEq/L (ref 3.5–5.1)
SODIUM: 143 meq/L (ref 136–145)
Total Protein: 7.4 g/dL (ref 6.4–8.3)

## 2013-12-29 NOTE — Progress Notes (Signed)
Please see consult note.  

## 2013-12-29 NOTE — Telephone Encounter (Signed)
Pt confirmed labs/ov per 12/02 POF, gave pt AVS..... KJ

## 2013-12-29 NOTE — Progress Notes (Signed)
Checked in new pt with no financial concerns at this time.  Pt has 2 insurances so financial assistance may not be needed but she has my card for any questions or concerns.

## 2013-12-29 NOTE — Consult Note (Signed)
Reason for Referral: Evaluation for a plasma cell disorder.   HPI: 58 year old woman with history of hypertension and hyperlipidemia as well as severe osteoarthritis t. She has had a hip replacement in 2009. She follows at [facility] and have done so for many years. Most recently she was evaluated for back pain and knee pain and had an MRI of the lumbar spine done on 11/20/2013. Her lumbar spine showed L5-S1 advanced disc degeneration and facet arthropathy. There is also an L4 and L5 facet arthropathy resulting in central canal stenosis. No malignancy noted. She had previously a an MRI of the right knee in March 2015 which showed complex tear in the anterior horn of the lateral meniscus and chondromalacia but no evidence of any bone lesions noted. On 11/29/2013 she was evaluated by Dr. Gladstone Lighter laboratory testing revealed a very small M spike of 0.58 g/dL with 2 restricted bands consistent of monoclonal protein. Quantitative immunoglobulins showed normal IgG level but depressed IgA and IgM. Immunofixation showed a biclonal gammopathy with IgG lambda monoclonal protein. Urine protein electrophoresis showed an area of slight restricted mobility that might suggest monoclonality. He expressed concerns about possible  plasma cell disorder. She was sent for an evaluation regarding that. Clinically, she has no constitutional symptoms of fevers or chills or sweats. She does not report any headaches or blurry vision at times she reports dizziness. She does not report any chest pain, shortness of breath or difficulty breathing. Does not report any cough or hemoptysis or hematemesis. He does not report any nausea, vomiting or abdominal pain. She does report diffuse arthralgias and myalgias. He does report back pain and leg pain. She does not report any urinary symptoms. Rest of her review of systems unremarkable.   Past Medical History  Diagnosis Date  . Back pain   . Osteoarthritis   . Status post hip  replacement   . Hypertension   :  Past Surgical History  Procedure Laterality Date  . Hip surgery      with hip replacement and revision  :   Current Outpatient Prescriptions  Medication Sig Dispense Refill  . ALPRAZolam (XANAX) 1 MG tablet Take 1 mg by mouth 4 (four) times daily.    . citalopram (CELEXA) 40 MG tablet Take 40 mg by mouth daily.    Marland Kitchen HYDROcodone-acetaminophen (LORCET) 10-650 MG per tablet Take 1 tablet by mouth every 6 (six) hours as needed. Pain    . ibuprofen (ADVIL,MOTRIN) 200 MG tablet Take 400 mg by mouth every 6 (six) hours as needed. Pain    . methocarbamol (ROBAXIN) 750 MG tablet 1-2 tablets 4 times daily as needed 40 tablet 0  . nabumetone (RELAFEN) 500 MG tablet Take 500 mg by mouth 2 (two) times daily.    Marland Kitchen omeprazole (PRILOSEC) 20 MG capsule Take 20 mg by mouth daily.    Marland Kitchen triamterene-hydrochlorothiazide (DYAZIDE) 37.5-25 MG per capsule Take 1 capsule by mouth every morning.     No current facility-administered medications for this visit.      Allergies  Allergen Reactions  . Codeine     REACTION: GI upset  . Ibuprofen   . Penicillins Other (See Comments)    GI upset  . Sulfonamide Derivatives Hives  :  No family history on file.:  History   Social History  . Marital Status: Married    Spouse Name: N/A    Number of Children: N/A  . Years of Education: N/A   Occupational History  . Not on file.  Social History Main Topics  . Smoking status: Current Every Day Smoker  . Smokeless tobacco: Not on file  . Alcohol Use: No  . Drug Use: No  . Sexual Activity: Not on file   Other Topics Concern  . Not on file   Social History Narrative  . No narrative on file  :  Pertinent items are noted in HPI.  Exam: Blood pressure 131/62, pulse 80, temperature 97.9 F (36.6 C), temperature source Oral, resp. rate 18, height _0  (1.651 m), weight 222 lb 11.2 oz (101.016 kg). General appearance: alert and cooperative Head: Normocephalic,  without obvious abnormality Neck: no adenopathy Back: negative Resp: clear to auscultation bilaterally Chest wall: no tenderness Cardio: regular rate and rhythm, S1, S2 normal, no murmur, click, rub or gallop GI: soft, non-tender; bowel sounds normal; no masses,  no organomegaly Extremities: extremities normal, atraumatic, no cyanosis or edema Pulses: 2+ and symmetric Skin: Skin color, texture, turgor normal. No rashes or lesions Lymph nodes: Cervical, supraclavicular, and axillary nodes normal.   Recent Labs  12/29/13 1305  WBC 11.6*  HGB 12.5  HCT 39.6  PLT 277    Recent Labs  12/29/13 1305  NA 143  K 3.6  CO2 26  GLUCOSE 85  BUN 23.4  CREATININE 1.1  CALCIUM 8.8      Assessment and Plan:   58 year old with osteoarthritis sent for an evaluation for plasma cell disorder. She had a biclonal M spike serum protein electrophoresis without any evidence to suggest monoclonal gammopathy in the urine.  The differential diagnosis was discussed today with the patient which include reactive findings, monoclonal gammopathy of undetermined significance, multiple myeloma, and amyloidosis. Her laboratory testing from today was reviewed included normal hemoglobin, normal creatinine, normal calcium and a normal protein. These findings do not suggest of active multiple myeloma at this time. She had multiple imaging studies including MRI as well as plain film x-rays and do not show any evidence of bone disease at this time. These findings most likely represent monoclonal gammopathy of undetermined significance and warrants observation and surveillance. I will repeat her protein testing in about 6 months and we will consider bone marrow biopsy if she develops any and organ damage.  Her M spike is biclonal and a rather small indicating likely reactive findings rather than a plasma cell disorder.

## 2013-12-31 LAB — SPEP & IFE WITH QIG
Albumin ELP: 54 % — ABNORMAL LOW (ref 55.8–66.1)
Alpha-1-Globulin: 5.2 % — ABNORMAL HIGH (ref 2.9–4.9)
Alpha-2-Globulin: 11.9 % — ABNORMAL HIGH (ref 7.1–11.8)
Beta 2: 3.6 % (ref 3.2–6.5)
Beta Globulin: 6.7 % (ref 4.7–7.2)
Gamma Globulin: 18.6 % (ref 11.1–18.8)
IGA: 31 mg/dL — AB (ref 69–380)
IgG (Immunoglobin G), Serum: 1430 mg/dL (ref 690–1700)
IgM, Serum: 31 mg/dL — ABNORMAL LOW (ref 52–322)
M-SPIKE, %: 0.62 g/dL
TOTAL PROTEIN, SERUM ELECTROPHOR: 7 g/dL (ref 6.0–8.3)

## 2014-03-04 ENCOUNTER — Encounter: Payer: Self-pay | Admitting: Internal Medicine

## 2014-06-30 ENCOUNTER — Other Ambulatory Visit (HOSPITAL_BASED_OUTPATIENT_CLINIC_OR_DEPARTMENT_OTHER): Payer: BLUE CROSS/BLUE SHIELD

## 2014-06-30 DIAGNOSIS — D472 Monoclonal gammopathy: Secondary | ICD-10-CM | POA: Diagnosis not present

## 2014-06-30 LAB — COMPREHENSIVE METABOLIC PANEL (CC13)
ALT: 7 U/L (ref 0–55)
ANION GAP: 9 meq/L (ref 3–11)
AST: 16 U/L (ref 5–34)
Albumin: 3.4 g/dL — ABNORMAL LOW (ref 3.5–5.0)
Alkaline Phosphatase: 84 U/L (ref 40–150)
BUN: 19 mg/dL (ref 7.0–26.0)
CO2: 25 mEq/L (ref 22–29)
CREATININE: 1 mg/dL (ref 0.6–1.1)
Calcium: 8.2 mg/dL — ABNORMAL LOW (ref 8.4–10.4)
Chloride: 110 mEq/L — ABNORMAL HIGH (ref 98–109)
EGFR: 60 mL/min/{1.73_m2} — ABNORMAL LOW (ref >90)
GLUCOSE: 86 mg/dL (ref 70–140)
Potassium: 3.6 mEq/L (ref 3.5–5.1)
SODIUM: 144 meq/L (ref 136–145)
TOTAL PROTEIN: 7 g/dL (ref 6.4–8.3)
Total Bilirubin: 0.24 mg/dL (ref 0.20–1.20)

## 2014-06-30 LAB — CBC WITH DIFFERENTIAL/PLATELET
BASO%: 0.6 % (ref 0.0–2.0)
BASOS ABS: 0 10*3/uL (ref 0.0–0.1)
EOS%: 8.6 % — ABNORMAL HIGH (ref 0.0–7.0)
Eosinophils Absolute: 0.7 10*3/uL — ABNORMAL HIGH (ref 0.0–0.5)
HCT: 35.8 % (ref 34.8–46.6)
HEMOGLOBIN: 11.6 g/dL (ref 11.6–15.9)
LYMPH%: 35.9 % (ref 14.0–49.7)
MCH: 26.7 pg (ref 25.1–34.0)
MCHC: 32.5 g/dL (ref 31.5–36.0)
MCV: 82.2 fL (ref 79.5–101.0)
MONO#: 0.8 10*3/uL (ref 0.1–0.9)
MONO%: 9.8 % (ref 0.0–14.0)
NEUT#: 3.6 10*3/uL (ref 1.5–6.5)
NEUT%: 45.1 % (ref 38.4–76.8)
Platelets: 221 10*3/uL (ref 145–400)
RBC: 4.35 10*6/uL (ref 3.70–5.45)
RDW: 16.2 % — ABNORMAL HIGH (ref 11.2–14.5)
WBC: 7.9 10*3/uL (ref 3.9–10.3)
lymph#: 2.8 10*3/uL (ref 0.9–3.3)

## 2014-07-04 LAB — SPEP & IFE WITH QIG
ALPHA-1-GLOBULIN: 0.3 g/dL (ref 0.2–0.3)
Abnormal Protein Band1: 0.5 g/dL
Albumin ELP: 3.6 g/dL — ABNORMAL LOW (ref 3.8–4.8)
Alpha-2-Globulin: 0.7 g/dL (ref 0.5–0.9)
BETA 2: 0.2 g/dL (ref 0.2–0.5)
Beta Globulin: 0.5 g/dL (ref 0.4–0.6)
Gamma Globulin: 1.3 g/dL (ref 0.8–1.7)
IGG (IMMUNOGLOBIN G), SERUM: 1570 mg/dL (ref 690–1700)
IgA: 25 mg/dL — ABNORMAL LOW (ref 69–380)
IgM, Serum: 28 mg/dL — ABNORMAL LOW (ref 52–322)
TOTAL PROTEIN, SERUM ELECTROPHOR: 6.6 g/dL (ref 6.1–8.1)

## 2014-07-04 LAB — KAPPA/LAMBDA LIGHT CHAINS
Kappa free light chain: 0.75 mg/dL (ref 0.33–1.94)
Kappa:Lambda Ratio: 0.2 — ABNORMAL LOW (ref 0.26–1.65)
LAMBDA FREE LGHT CHN: 3.78 mg/dL — AB (ref 0.57–2.63)

## 2014-07-07 ENCOUNTER — Telehealth: Payer: Self-pay | Admitting: Oncology

## 2014-07-07 ENCOUNTER — Ambulatory Visit (HOSPITAL_BASED_OUTPATIENT_CLINIC_OR_DEPARTMENT_OTHER): Payer: BLUE CROSS/BLUE SHIELD | Admitting: Oncology

## 2014-07-07 VITALS — BP 132/72 | HR 83 | Temp 97.8°F | Resp 16 | Wt 230.0 lb

## 2014-07-07 DIAGNOSIS — D472 Monoclonal gammopathy: Secondary | ICD-10-CM

## 2014-07-07 NOTE — Progress Notes (Signed)
Hematology and Oncology Follow Up Visit  [name] [id number] 12-19-1955 59 y.o. 07/07/2014 2:53 PM Celedonio Savage, MDBluth, Selinda Flavin, MD   Principle Diagnosis: 59 year old woman with IgG MGUS presented with an M spike of 0.62 g/dL, IgG level 1430 without any evidence of end organ damage in December 2015. She has a normal hemoglobin, kidney function and no bone lesions.   Current therapy: Observation and surveillance.  Interim History:  [name] presents today for a follow-up visit. Since the last visit, she reports no complaints. She does have chronic pain associated with arthritis of her knees and back that have been imaged multiple times previously and have not changed clinically. She has not reported any constitutional symptoms of fevers or chills or lymphadenopathy. She does not report any petechiae or bruising. She has not reported any recurrent infections or neuropathy.  She does not report any headaches or blurry vision at times she reports dizziness. She does not report any chest pain, shortness of breath or difficulty breathing. Does not report any cough or hemoptysis or hematemesis. He does not report any nausea, vomiting or abdominal pain. She does report diffuse arthralgias and myalgias. He does report back pain and leg pain. She does not report any urinary symptoms. Rest of her review of systems unremarkable.   Medications: I have reviewed the patient's current medications.  Current Outpatient Prescriptions  Medication Sig Dispense Refill  . ALPRAZolam (XANAX) 1 MG tablet Take 1 mg by mouth 4 (four) times daily.    Marland Kitchen atorvastatin (LIPITOR) 20 MG tablet Take 20 mg by mouth daily.    Marland Kitchen HYDROcodone-acetaminophen (LORCET) 10-650 MG per tablet Take 1 tablet by mouth every 6 (six) hours as needed. Pain    . levothyroxine (SYNTHROID, LEVOTHROID) 75 MCG tablet Take 75 mcg by mouth daily before breakfast.    . omeprazole (PRILOSEC) 20 MG capsule Take 20 mg by mouth daily.    . potassium  chloride (K-DUR) 10 MEQ tablet Take 10 mEq by mouth daily.    . ranitidine (ZANTAC) 150 MG capsule Take 150 mg by mouth 2 (two) times daily.    . sertraline (ZOLOFT) 100 MG tablet Take 100 mg by mouth daily.    Marland Kitchen triamterene-hydrochlorothiazide (DYAZIDE) 37.5-25 MG per capsule Take 1 capsule by mouth every morning.     No current facility-administered medications for this visit.     Allergies:  Allergies  Allergen Reactions  . Codeine     REACTION: GI upset  . Ibuprofen   . Penicillins Other (See Comments)    GI upset  . Sulfonamide Derivatives Hives    Past Medical History, Surgical history, Social history, and Family History were reviewed and updated.   Physical Exam: Blood pressure 132/72, pulse 83, temperature 97.8 F (36.6 C), temperature source Oral, resp. rate 16, weight 230 lb (104.327 kg). ECOG: 1 General appearance: alert and cooperative Head: Normocephalic, without obvious abnormality Neck: no adenopathy Lymph nodes: Cervical, supraclavicular, and axillary nodes normal. Heart:regular rate and rhythm, S1, S2 normal, no murmur, click, rub or gallop Lung:chest clear, no wheezing, rales, normal symmetric air entry Abdomin: soft, non-tender, without masses or organomegaly EXT:no erythema, induration, or nodules   Lab Results: Lab Results  Component Value Date   WBC 7.9 06/30/2014   HGB 11.6 06/30/2014   HCT 35.8 06/30/2014   MCV 82.2 06/30/2014   PLT 221 06/30/2014     Chemistry      Component Value Date/Time   NA 144 06/30/2014 1448   NA  134* 03/19/2008 0505   K 3.6 06/30/2014 1448   K 2.9 RESULT REPEATED AND VERIFIED* 03/19/2008 0505   CL 104 03/19/2008 0505   CO2 25 06/30/2014 1448   CO2 26 03/19/2008 0505   BUN 19.0 06/30/2014 1448   BUN 4* 03/19/2008 0505   CREATININE 1.0 06/30/2014 1448   CREATININE 0.82 03/19/2008 0505      Component Value Date/Time   CALCIUM 8.2* 06/30/2014 1448   CALCIUM 7.6* 03/19/2008 0505   ALKPHOS 84 06/30/2014 1448    AST 16 06/30/2014 1448   ALT 7 06/30/2014 1448   BILITOT 0.24 06/30/2014 1448     Results for YENESIS, EVEN (MRN [id number]) as of 07/07/2014 14:37  Ref. Range 12/29/2013 13:05 06/30/2014 14:48  M-SPIKE, % Latest Units: g/dL 0.62   SPE Interp. Unknown * *  IgG (Immunoglobin G), Serum Latest Ref Range: [phone] mg/dL 1430 1570   Impression and Plan:  60 year old woman with the following issues:  1. IgG lambda monoclonal protein likely represent monoclonal gammopathy of undetermined significance (MGUS). Her M spike is very small at 0.5 g/dL with a normal IgG level. I see no evidence of end organ damage at this time. The differential diagnosis also include reactive findings, amyloidosis and less likely multiple myeloma.  She is asymptomatic at this time and the plan is to continue with active surveillance. I'll repeat her protein studies on an annual basis and restage her with a bone marrow biopsy, skeletal survey if she develops any progressive cytopenia, increase protein levels or signs of end organ damage.  2. Follow-up: Will be in one year sooner if needed to.   Zola Button, MD 6/9/20162:53 PM

## 2014-07-07 NOTE — Telephone Encounter (Signed)
Gave and printed appt sched and avs for pt for June 2017 °

## 2015-07-06 ENCOUNTER — Other Ambulatory Visit: Payer: Medicare Other

## 2015-07-13 ENCOUNTER — Ambulatory Visit: Payer: Medicare Other | Admitting: Oncology

## 2015-10-13 ENCOUNTER — Encounter: Payer: Self-pay | Admitting: Cardiology

## 2015-10-13 NOTE — Progress Notes (Signed)
.    Cardiology Office Note  Date: 10/16/2015   ID: [name], DOB 04/13/1955, MRN [id number]  PCP: Deloria Lair, MD  Consulting Cardiologist: Rozann Lesches, MD   Chief Complaint  Patient presents with  . Chest Pain    History of Present Illness: [name] is a 60 y.o. female referred for cardiology consultation by Dr. Scotty Court. She presents with recurring episodes of chest tightness, not specifically associated with exertion. She does report that she is under stress and also has depression. She takes Zoloft, actually reports an episode when she took this medication and that it felt "stuck." She had chest pain at that time also associated with nausea and emesis as well as diaphoresis. Since that episode however she has not had any dysphasia, does not report typical reflux. She states that her chest tightness occurs on a daily basis.  She states that she has chronic right hip and bilateral knee pain that limit her ambulation. She follows with an orthopedic surgeon, is undergoing further testing. She is not certain whether she will need any additional surgery. She has had a right hip replacement with reoperation and also left knee arthroscopy.  Record review finds previous evaluation by myself and Dr. Percival Spanish back in 2010. She underwent cardiac catheterization on 11/23/2008 which showed no significant CAD and normal LVEF. She has not had any follow-up ischemic testing. I reviewed her ECG today which shows sinus rhythm with decreased R wave progression and nonspecific T-wave changes.  Past Medical History:  Diagnosis Date  . Allergic rhinitis   . Anxiety   . Chronic back pain   . Depression   . Essential hypertension   . History of gastritis    EGD 2015  . Osteoarthritis   . Spinal stenosis     Past Surgical History:  Procedure Laterality Date  . APPENDECTOMY     1985  . CARPAL TUNNEL RELEASE Left   . CARPAL TUNNEL RELEASE Right   . TONSILLECTOMY    . TOTAL  ABDOMINAL HYSTERECTOMY     1985  . TOTAL HIP ARTHROPLASTY Right    Original surgery 2006 with revision 2010  . ULNAR NERVE TRANSPOSITION      Current Outpatient Prescriptions  Medication Sig Dispense Refill  . ALPRAZolam (XANAX) 1 MG tablet Take 1 mg by mouth 4 (four) times daily.    Marland Kitchen atorvastatin (LIPITOR) 20 MG tablet Take 20 mg by mouth daily.    Marland Kitchen HYDROcodone-acetaminophen (LORCET) 10-650 MG per tablet Take 1 tablet by mouth every 6 (six) hours as needed. Pain    . levothyroxine (SYNTHROID, LEVOTHROID) 75 MCG tablet Take 75 mcg by mouth daily before breakfast.    . meloxicam (MOBIC) 15 MG tablet Take 15 mg by mouth daily.    Marland Kitchen omeprazole (PRILOSEC) 20 MG capsule Take 20 mg by mouth daily.    . potassium chloride (K-DUR) 10 MEQ tablet Take 10 mEq by mouth daily.    . ranitidine (ZANTAC) 150 MG capsule Take 150 mg by mouth 2 (two) times daily.    . sertraline (ZOLOFT) 100 MG tablet Take 100 mg by mouth daily.    Marland Kitchen triamterene-hydrochlorothiazide (DYAZIDE) 37.5-25 MG per capsule Take 1 capsule by mouth every morning.     No current facility-administered medications for this visit.    Allergies:  Codeine; Ibuprofen; Penicillins; and Sulfonamide derivatives   Social History: The patient  reports that she has been smoking.  She uses smokeless tobacco. She reports that she does not  drink alcohol or use drugs.   Family History: The patient's family history includes COPD in her mother; Cerebral aneurysm in her brother; Heart attack in her father; Lung disease in her father.   ROS:  Please see the history of present illness. Otherwise, complete review of systems is positive for symptoms of depression, chronic right hip and bilateral knee pain.  All other systems are reviewed and negative.   Physical Exam: VS:  BP 122/77   Pulse 83   Ht '5\' 2"'$  (1.575 m)   Wt 213 lb 6.4 oz (96.8 kg)   BMI 39.03 kg/m , BMI Body mass index is 39.03 kg/m.  Wt Readings from Last 3 Encounters:  10/16/15  213 lb 6.4 oz (96.8 kg)  07/07/14 230 lb (104.3 kg)  12/29/13 222 lb 11.2 oz (101 kg)    General: Overweight woman, appears comfortable at rest. HEENT: Conjunctiva and lids normal, oropharynx clear. Neck: Supple, no elevated JVP or carotid bruits, no thyromegaly. Lungs: Clear to auscultation, nonlabored breathing at rest. Cardiac: Regular rate and rhythm, no S3 or significant systolic murmur, no pericardial rub. Abdomen: Soft, nontender, bowel sounds present, no guarding or rebound. Extremities: No pitting edema, distal pulses 2+. Skin: Warm and dry. Musculoskeletal: No kyphosis. Neuropsychiatric: Alert and oriented x3, affect grossly appropriate.  ECG: I personally reviewed the tracing from 11/17/2008 which showed normal sinus rhythm.  Recent Labwork:  March 2017: BUN 14, creatinine 0.9, potassium 3.3, hemoglobin 11.1, platelets 237  Assessment and Plan:  1. Recurring chest tightness, largely atypical features. ECG shows decreased R wave progression with nonspecific T-wave changes. She has hypertension and also takes Lipitor for treatment of lipids. She has not undergone any recent ischemic evaluation. In light of her symptoms and also the possibility that she may need upcoming orthopedic surgery, we will obtain a [place] for further assessment. She states that she would not be able to navigate a treadmill at this time due to her orthopedic limitations.  2. Hypertension, on Dyazide. Blood pressure control is adequate today.  3. On statin therapy for management of lipids. She follows with Dr. Scotty Court.  4. History of gastritis. Description of some of her present symptoms do seem more GI related. If ischemic testing is reassuring, may need to be considered for GI evaluation if her symptoms persist.  Current medicines were reviewed with the patient today.   Orders Placed This Encounter  Procedures  . NM Myocar Multi W/Spect W/Wall Motion / EF  . Myocardial Perfusion Imaging    . EKG 12-Lead    Disposition: Call with results.  Signed, Satira Sark, MD, [facility] 10/16/2015 9:23 AM    [facility], [place], [place] [address] Phone: [phone]; Fax: [phone]

## 2015-10-16 ENCOUNTER — Encounter: Payer: Self-pay | Admitting: Cardiology

## 2015-10-16 ENCOUNTER — Other Ambulatory Visit: Payer: Self-pay | Admitting: Orthopedic Surgery

## 2015-10-16 ENCOUNTER — Ambulatory Visit (INDEPENDENT_AMBULATORY_CARE_PROVIDER_SITE_OTHER): Payer: BLUE CROSS/BLUE SHIELD | Admitting: Cardiology

## 2015-10-16 ENCOUNTER — Encounter: Payer: Self-pay | Admitting: *Deleted

## 2015-10-16 VITALS — BP 122/77 | HR 83 | Ht 62.0 in | Wt 213.4 lb

## 2015-10-16 DIAGNOSIS — R072 Precordial pain: Secondary | ICD-10-CM

## 2015-10-16 DIAGNOSIS — M5126 Other intervertebral disc displacement, lumbar region: Secondary | ICD-10-CM

## 2015-10-16 DIAGNOSIS — R9431 Abnormal electrocardiogram [ECG] [EKG]: Secondary | ICD-10-CM | POA: Diagnosis not present

## 2015-10-16 DIAGNOSIS — I10 Essential (primary) hypertension: Secondary | ICD-10-CM

## 2015-10-16 DIAGNOSIS — R0789 Other chest pain: Secondary | ICD-10-CM

## 2015-10-16 DIAGNOSIS — E785 Hyperlipidemia, unspecified: Secondary | ICD-10-CM

## 2015-10-16 DIAGNOSIS — Z8719 Personal history of other diseases of the digestive system: Secondary | ICD-10-CM

## 2015-10-16 NOTE — Patient Instructions (Signed)
Medication Instructions:  Continue all current medications.  Labwork: none  Testing/Procedures:  Your physician has requested that you have a lexiscan myoview. For further information please visit [email or web address]. Please follow instruction sheet, as given.  Office will contact with results via phone or letter.    Follow-Up: To be determined based on test results.   Any Other Special Instructions Will Be Listed Below (If Applicable).  If you need a refill on your cardiac medications before your next appointment, please call your pharmacy.

## 2015-10-20 ENCOUNTER — Encounter (HOSPITAL_COMMUNITY): Payer: Self-pay

## 2015-10-20 ENCOUNTER — Encounter (HOSPITAL_COMMUNITY)
Admission: RE | Admit: 2015-10-20 | Discharge: 2015-10-20 | Disposition: A | Discharge status: Home / Self Care | Payer: BLUE CROSS/BLUE SHIELD | Source: Ambulatory Visit | Attending: Cardiology | Admitting: Cardiology

## 2015-10-20 ENCOUNTER — Inpatient Hospital Stay (HOSPITAL_COMMUNITY): Admission: RE | Admit: 2015-10-20 | Payer: Medicare Other | Source: Ambulatory Visit

## 2015-10-20 ENCOUNTER — Telehealth: Payer: Self-pay | Admitting: *Deleted

## 2015-10-20 DIAGNOSIS — R072 Precordial pain: Secondary | ICD-10-CM

## 2015-10-20 DIAGNOSIS — R9431 Abnormal electrocardiogram [ECG] [EKG]: Secondary | ICD-10-CM | POA: Insufficient documentation

## 2015-10-20 LAB — NM MYOCAR MULTI W/SPECT W/WALL MOTION / EF
CHL CUP NUCLEAR SRS: 1
CHL CUP RESTING HR STRESS: 74 {beats}/min
CSEPPHR: 104 {beats}/min
LV dias vol: 79 mL (ref 46–106)
LVSYSVOL: 27 mL
RATE: 0.28
SDS: 7
SSS: 8
TID: 0.89

## 2015-10-20 MED ORDER — TECHNETIUM TC 99M TETROFOSMIN IV KIT
10.0000 | PACK | Freq: Once | INTRAVENOUS | Status: AC | PRN
  Administered 2015-10-20: 10.6 via INTRAVENOUS

## 2015-10-20 MED ORDER — SODIUM CHLORIDE 0.9% FLUSH
INTRAVENOUS | Status: AC
  Administered 2015-10-20: 10 mL via INTRAVENOUS
  Filled 2015-10-20: qty 10

## 2015-10-20 MED ORDER — TECHNETIUM TC 99M TETROFOSMIN IV KIT
30.0000 | PACK | Freq: Once | INTRAVENOUS | Status: AC | PRN
  Administered 2015-10-20: 28 via INTRAVENOUS

## 2015-10-20 MED ORDER — REGADENOSON 0.4 MG/5ML IV SOLN
INTRAVENOUS | Status: AC
  Administered 2015-10-20: 0.4 mg via INTRAVENOUS
  Filled 2015-10-20: qty 5

## 2015-10-20 NOTE — Telephone Encounter (Signed)
Patient informed and verbalized understanding of plan. Copy sent to PCP 

## 2015-10-20 NOTE — Telephone Encounter (Signed)
-----   Message from Satira Sark, MD sent at 10/20/2015  1:33 PM EDT ----- Results reviewed. I reviewed the images as well. Stress test was abnormal suggesting the possibility of inferior wall ischemia (RCA distribution) although there does appear to be a component of artifact as well. Would recommend that she be seen next week on APP schedule to discuss possibility of diagnostic cardiac catheterization to clarify in light of recurring symptoms. A copy of this test should be forwarded to [facility], MD.

## 2015-10-25 ENCOUNTER — Ambulatory Visit
Admission: RE | Admit: 2015-10-25 | Discharge: 2015-10-25 | Disposition: A | Discharge status: Home / Self Care | Payer: BLUE CROSS/BLUE SHIELD | Source: Ambulatory Visit | Attending: Orthopedic Surgery | Admitting: Orthopedic Surgery

## 2015-10-25 DIAGNOSIS — M5126 Other intervertebral disc displacement, lumbar region: Secondary | ICD-10-CM

## 2015-10-25 MED ORDER — MEPERIDINE HCL 100 MG/ML IJ SOLN
75.0000 mg | Freq: Once | INTRAMUSCULAR | Status: AC
  Administered 2015-10-25: 75 mg via INTRAMUSCULAR

## 2015-10-25 MED ORDER — IOPAMIDOL (ISOVUE-M 200) INJECTION 41%
15.0000 mL | Freq: Once | INTRAMUSCULAR | Status: AC
  Administered 2015-10-25: 15 mL via INTRATHECAL

## 2015-10-25 MED ORDER — ONDANSETRON HCL 4 MG/2ML IJ SOLN
4.0000 mg | Freq: Once | INTRAMUSCULAR | Status: AC
  Administered 2015-10-25: 4 mg via INTRAMUSCULAR

## 2015-10-25 MED ORDER — DIAZEPAM 5 MG PO TABS
10.0000 mg | ORAL_TABLET | Freq: Once | ORAL | Status: AC
  Administered 2015-10-25: 10 mg via ORAL

## 2015-10-25 MED ORDER — ONDANSETRON HCL 4 MG/2ML IJ SOLN
4.0000 mg | Freq: Four times a day (QID) | INTRAMUSCULAR | Status: DC | PRN
Start: 2015-10-25 — End: 2015-10-26

## 2015-10-25 NOTE — Progress Notes (Signed)
Pt states she has been off Zoloft since Sunday night.

## 2015-10-25 NOTE — Discharge Instructions (Signed)
Myelogram Discharge Instructions  1. Go home and rest quietly for the next 24 hours.  It is important to lie flat for the next 24 hours.  Get up only to go to the restroom.  You may lie in the bed or on a couch on your back, your stomach, your left side or your right side.  You may have one pillow under your head.  You may have pillows between your knees while you are on your side or under your knees while you are on your back.  2. DO NOT drive today.  Recline the seat as far back as it will go, while still wearing your seat belt, on the way home.  3. You may get up to go to the bathroom as needed.  You may sit up for 10 minutes to eat.  You may resume your normal diet and medications unless otherwise indicated.  Drink lots of extra fluids today and tomorrow.  4. The incidence of headache, nausea, or vomiting is about 5% (one in 20 patients).  If you develop a headache, lie flat and drink plenty of fluids until the headache goes away.  Caffeinated beverages may be helpful.  If you develop severe nausea and vomiting or a headache that does not go away with flat bed rest, call [phone].  5. You may resume normal activities after your 24 hours of bed rest is over; however, do not exert yourself strongly or do any heavy lifting tomorrow. If when you get up you have a headache when standing, go back to bed and force fluids for another 24 hours.  6. Call your physician for a follow-up appointment.  The results of your myelogram will be sent directly to your physician by the following day.  7. If you have any questions or if complications develop after you arrive home, please call [phone].  Discharge instructions have been explained to the patient.  The patient, or the person responsible for the patient, fully understands these instructions.         May resume Zoloft on Sept. 28, 2017, after 1:00 pm.

## 2015-10-26 ENCOUNTER — Ambulatory Visit (INDEPENDENT_AMBULATORY_CARE_PROVIDER_SITE_OTHER): Payer: BLUE CROSS/BLUE SHIELD | Admitting: Adult Health

## 2015-10-26 ENCOUNTER — Encounter: Payer: Self-pay | Admitting: Adult Health

## 2015-10-26 VITALS — BP 120/66 | HR 79 | Ht 62.0 in | Wt 209.0 lb

## 2015-10-26 DIAGNOSIS — Z01818 Encounter for other preprocedural examination: Secondary | ICD-10-CM

## 2015-10-26 MED ORDER — NITROGLYCERIN 0.4 MG SL SUBL: 0.4000 mg | SUBLINGUAL_TABLET | SUBLINGUAL | 3 refills | Status: DC | PRN

## 2015-10-26 NOTE — Progress Notes (Signed)
Cardiology Office Note   Date:  10/26/2015   ID:  MALEAH [name], DOB [birth year], MRN [id number]  PCP:  Deloria Lair, MD  Cardiologist: [place]/  Jory Sims, NP   No chief complaint on file.     History of Present Illness: [name] is a 60 y.o. female who presents for ongoing assessment and management of chest pain, and was initially seen by Dr. Domenic Polite on 10/16/2015. Previous records revealed that she had a cardiac catheterization on 11/23/2008 which showed no significant CAD and normal LVEF. EKG at that time revealed normal sinus rhythm with decreased R-wave progression and nonspecific T-wave abnormalities. The patient also has a significant amount of anxiety. She was scheduled for [facility]. No medications were changed at that time. She did have symptoms of gastritis and if ischemic testing was reassuring she would be considered for GI evaluation.  NM Stress test on 10/20/2015 revealed.    There was no ST segment deviation noted during stress.  No T wave inversion was noted during stress.  Findings consistent with moderate inferior ischemia.  The left ventricular ejection fraction is hyperdynamic (>65%).  This is an intermediate risk study.   She constantly day tearful with multiple somatic complaints and discussion of a lot of family dysfunction and issues. She continues to feel tired and having chronic dyspnea on exertion. She unfortunately continues to smoke.  Past Medical History:  Diagnosis Date  . Allergic rhinitis   . Anxiety   . Chronic back pain   . Depression   . Essential hypertension   . History of gastritis    EGD 2015  . Osteoarthritis   . Spinal stenosis     Past Surgical History:  Procedure Laterality Date  . APPENDECTOMY     1985  . CARPAL TUNNEL RELEASE Left   . CARPAL TUNNEL RELEASE Right   . TONSILLECTOMY    . TOTAL ABDOMINAL HYSTERECTOMY     1985  . TOTAL HIP ARTHROPLASTY Right    Original surgery 2006 with revision  2010  . ULNAR NERVE TRANSPOSITION       Current Outpatient Prescriptions  Medication Sig Dispense Refill  . ALPRAZolam (XANAX) 1 MG tablet Take 1 mg by mouth 4 (four) times daily.    Marland Kitchen atorvastatin (LIPITOR) 20 MG tablet Take 20 mg by mouth daily.    Marland Kitchen HYDROcodone-acetaminophen (LORCET) 10-650 MG per tablet Take 1 tablet by mouth every 6 (six) hours as needed. Pain    . levothyroxine (SYNTHROID, LEVOTHROID) 75 MCG tablet Take 75 mcg by mouth daily before breakfast.    . meloxicam (MOBIC) 15 MG tablet Take 15 mg by mouth daily.    Marland Kitchen omeprazole (PRILOSEC) 20 MG capsule Take 20 mg by mouth daily.    . potassium chloride (K-DUR) 10 MEQ tablet Take 10 mEq by mouth daily.    . ranitidine (ZANTAC) 150 MG capsule Take 150 mg by mouth 2 (two) times daily.    . sertraline (ZOLOFT) 100 MG tablet Take 150 mg by mouth daily.     Marland Kitchen triamterene-hydrochlorothiazide (DYAZIDE) 37.5-25 MG per capsule Take 1 capsule by mouth every morning.    . nitroGLYCERIN (NITROSTAT) 0.4 MG SL tablet Place 1 tablet (0.4 mg total) under the tongue every 5 (five) minutes as needed for chest pain. 25 tablet 3   No current facility-administered medications for this visit.     Allergies:   Advil [ibuprofen]; Aleve [naproxen sodium]; Codeine; Penicillins; and Sulfonamide derivatives    Social History:  The patient  reports that she has been smoking.  She uses smokeless tobacco. She reports that she does not drink alcohol or use drugs.   Family History:  The patient's family history includes COPD in her mother; Cerebral aneurysm in her brother; Heart attack in her father; Lung disease in her father.    ROS: All other systems are reviewed and negative. Unless otherwise mentioned in H&P    PHYSICAL EXAM: VS:  BP 120/66   Pulse 79   Ht '5\' 2"'$  (1.575 m)   Wt 209 lb (94.8 kg)   SpO2 97%   BMI 38.23 kg/m  , BMI Body mass index is 38.23 kg/m. GEN: Well nourished, well developed, in no acute distress  HEENT: normal  Neck:  no JVD, carotid bruits, or masses Cardiac: RRR; no murmurs, rubs, or gallops,no edema  Respiratory:  clear to auscultation bilaterally, normal work of breathing GI: soft, nontender, nondistended, + BS MS: no deformity or atrophy  Skin: warm and dry, no rash Neuro:  Strength and sensation are intact Psych: euthymic mood, full affect  Recent Labs: No results found for requested labs within last 8760 hours.    Lipid Panel No results found for: CHOL, TRIG, HDL, CHOLHDL, VLDL, LDLCALC, LDLDIRECT    Wt Readings from Last 3 Encounters:  10/26/15 209 lb (94.8 kg)  10/16/15 213 lb 6.4 oz (96.8 kg)  07/07/14 230 lb (104.3 kg)     ASSESSMENT AND PLAN:  1. Abnormal nuclear medicine stress test: Symptoms of fatigue and angina. I've discussed this with Dr. Domenic Polite on site, and we will plan a left heart cath with coronary angiography and possible intervention. I've explained this procedure in detail to include risks and benefits and she is willing to proceed. She is aware that she may stay the night at a intervention is completed.  This will be completed on 10/31/2015 at 7:30 AM with Dr. Ellyn Hack. No medications are to be held at this time. I've given her a prescription for sublingual nitroglycerin to be used when necessary for chest discomfort.  2. Hypercholesterolemia: She will continue statin therapy Lipitor 20 mg daily. This may need to be increased should she have intervention.  3. Hypertension: Blood pressure is well controlled currently. We'll continue current medication regimen.  4. Ongoing tobacco abuse: Patient is smoking one pack a day. I've calcitriol her on smoking cessation. With her current family stressors she is not inclined to stop smoking at this time. This will be reinforced on follow-up visits especially if an intervention is necessary during cardiac catheterization.   Current medicines are reviewed at length with the patient today.  I spent approximately 30 minutes with  this patient going over her current symptoms commonly family stressors, offering reassurance, and explanation of catheterization.  Labs/ tests ordered today include: Cardiac catheterization  Orders Placed This Encounter  Procedures  . DG Chest 2 View  . Basic Metabolic Panel (BMET)  . CBC with Differential  . INR/PT     Disposition:   FU with Cardiology post catheterization.  Signed, Jory Sims, NP  10/26/2015 5:14 PM    [facility] [address], [place], [place] [address] Phone: [phone]; Fax: [phone]

## 2015-10-26 NOTE — Patient Instructions (Signed)
Medication Instructions:  Take nitroglycerin as needed for chest pain ( up to 3 times, 5 minutes apart )   Labwork: Your physician recommends that you return for lab work in: today:  Tomorrow  bmet Cbc Pt inr    Testing/Procedures: A chest x-ray takes a picture of the organs and structures inside the chest, including the heart, lungs, and blood vessels. This test can show several things, including, whether the heart is enlarges; whether fluid is building up in the lungs; and whether pacemaker / defibrillator leads are still in place.( tomorrow )   Your physician has requested that you have a cardiac catheterization. Cardiac catheterization is used to diagnose and/or treat various heart conditions. Doctors may recommend this procedure for a number of different reasons. The most common reason is to evaluate chest pain. Chest pain can be a symptom of coronary artery disease (CAD), and cardiac catheterization can show whether plaque is narrowing or blocking your heart's arteries. This procedure is also used to evaluate the valves, as well as measure the blood flow and oxygen levels in different parts of your heart. For further information please visit [facility]ugeFiesta[facility][email or web address]. Please follow instruction sheet, as given.    Follow-Up: Your physician recommends that you schedule a follow-up appointment in: TO BE DETERMINED    Any Other Special Instructions Will Be Listed Below (If Applicable).     If you need a refill on your cardiac medications before your next appointment, please call your pharmacy.

## 2015-10-26 NOTE — Progress Notes (Signed)
Name: [name]    DOB: 26-Mar-1955  Age: 60 y.o.  MR#: [id number]       PCP:  Deloria Lair, MD      Insurance: Payor: [employer] / Plan: [place] / Product Type: *No Product type* /   CC:   No chief complaint on file.   VS Vitals:   10/26/15 1542  Weight: 209 lb (94.8 kg)  Height: 5' 2"  (1.575 m)    Weights Current Weight  10/26/15 209 lb (94.8 kg)  10/16/15 213 lb 6.4 oz (96.8 kg)  07/07/14 230 lb (104.3 kg)    Blood Pressure  BP Readings from Last 3 Encounters:  10/25/15 132/79  10/16/15 122/77  07/07/14 132/72     Admit date:  (Not on file) Last encounter with RMR:  Visit date not found   Allergy Advil [ibuprofen]; Aleve [naproxen sodium]; Codeine; Penicillins; and Sulfonamide derivatives  Current Outpatient Prescriptions  Medication Sig Dispense Refill  . ALPRAZolam (XANAX) 1 MG tablet Take 1 mg by mouth 4 (four) times daily.    Marland Kitchen atorvastatin (LIPITOR) 20 MG tablet Take 20 mg by mouth daily.    Marland Kitchen HYDROcodone-acetaminophen (LORCET) 10-650 MG per tablet Take 1 tablet by mouth every 6 (six) hours as needed. Pain    . levothyroxine (SYNTHROID, LEVOTHROID) 75 MCG tablet Take 75 mcg by mouth daily before breakfast.    . meloxicam (MOBIC) 15 MG tablet Take 15 mg by mouth daily.    Marland Kitchen omeprazole (PRILOSEC) 20 MG capsule Take 20 mg by mouth daily.    . potassium chloride (K-DUR) 10 MEQ tablet Take 10 mEq by mouth daily.    . ranitidine (ZANTAC) 150 MG capsule Take 150 mg by mouth 2 (two) times daily.    . sertraline (ZOLOFT) 100 MG tablet Take 150 mg by mouth daily.     Marland Kitchen triamterene-hydrochlorothiazide (DYAZIDE) 37.5-25 MG per capsule Take 1 capsule by mouth every morning.     No current facility-administered medications for this visit.     Discontinued Meds:   There are no discontinued medications.  Patient Active Problem List   Diagnosis Date Noted  . Hip bursitis 08/20/2012  . Trigger finger, acquired 08/20/2012  . TOBACCO USER 12/12/2008  .  DYSLIPIDEMIA 11/17/2008  . ANXIETY DISORDER 11/16/2008  . HYPERTENSION, BENIGN 11/16/2008  . GEN OSTEOARTHROSIS INVOLVING MULTIPLE SITES 11/16/2008  . CHEST DISCOMFORT, ATYPICAL 11/16/2008    LABS    Component Value Date/Time   NA 144 06/30/2014 1448   NA 143 12/29/2013 1305   NA 134 (L) 03/19/2008 0505   NA 137 03/18/2008 0435   NA 146 (H) 03/10/2008 0935   K 3.6 06/30/2014 1448   K 3.6 12/29/2013 1305   K 2.9 RESULT REPEATED AND VERIFIED (L) 03/19/2008 0505   K 3.6 03/18/2008 0435   K 4.0 03/10/2008 0935   CL 104 03/19/2008 0505   CL 105 03/18/2008 0435   CL 104 03/10/2008 0935   CO2 25 06/30/2014 1448   CO2 26 12/29/2013 1305   CO2 26 03/19/2008 0505   CO2 28 03/18/2008 0435   CO2 31 03/10/2008 0935   GLUCOSE 86 06/30/2014 1448   GLUCOSE 85 12/29/2013 1305   GLUCOSE 128 (H) 03/19/2008 0505   GLUCOSE 121 (H) 03/18/2008 0435   GLUCOSE 105 (H) 03/10/2008 0935   BUN 19.0 06/30/2014 1448   BUN 23.4 12/29/2013 1305   BUN 4 (L) 03/19/2008 0505   BUN 9 03/18/2008 0435   BUN  11 03/10/2008 0935   CREATININE 1.0 06/30/2014 1448   CREATININE 1.1 12/29/2013 1305   CREATININE 0.82 03/19/2008 0505   CREATININE 0.77 03/18/2008 0435   CREATININE 0.79 03/10/2008 0935   CALCIUM 8.2 (L) 06/30/2014 1448   CALCIUM 8.8 12/29/2013 1305   CALCIUM 7.6 (L) 03/19/2008 0505   CALCIUM 7.2 (L) 03/18/2008 0435   CALCIUM 9.4 03/10/2008 0935   GFRNONAA >60 03/19/2008 0505   GFRNONAA >60 03/18/2008 0435   GFRNONAA >60 03/10/2008 0935   GFRAA  03/19/2008 0505    >60        The eGFR has been calculated using the MDRD equation. This calculation has not been validated in all clinical situations. eGFR's persistently <60 mL/min signify possible Chronic Kidney Disease.   GFRAA  03/18/2008 0435    >60        The eGFR has been calculated using the MDRD equation. This calculation has not been validated in all clinical situations. eGFR's persistently <60 mL/min signify possible Chronic  Kidney Disease.   GFRAA  03/10/2008 0935    >60        The eGFR has been calculated using the MDRD equation. This calculation has not been validated in all clinical situations. eGFR's persistently <60 mL/min signify possible Chronic Kidney Disease.   CMP     Component Value Date/Time   NA 144 06/30/2014 1448   K 3.6 06/30/2014 1448   CL 104 03/19/2008 0505   CO2 25 06/30/2014 1448   GLUCOSE 86 06/30/2014 1448   BUN 19.0 06/30/2014 1448   CREATININE 1.0 06/30/2014 1448   CALCIUM 8.2 (L) 06/30/2014 1448   PROT 7.0 06/30/2014 1448   ALBUMIN 3.4 (L) 06/30/2014 1448   AST 16 06/30/2014 1448   ALT 7 06/30/2014 1448   ALKPHOS 84 06/30/2014 1448   BILITOT 0.24 06/30/2014 1448   GFRNONAA >60 03/19/2008 0505   GFRAA  03/19/2008 0505    >60        The eGFR has been calculated using the MDRD equation. This calculation has not been validated in all clinical situations. eGFR's persistently <60 mL/min signify possible Chronic Kidney Disease.       Component Value Date/Time   WBC 7.9 06/30/2014 1448   WBC 11.6 (H) 12/29/2013 1305   WBC 10.8 (H) 03/19/2008 0505   WBC 9.5 03/18/2008 0435   WBC 9.5 03/10/2008 0935   HGB 11.6 06/30/2014 1448   HGB 12.5 12/29/2013 1305   HGB 9.7 (L) 03/19/2008 0505   HGB 10.2 (L) 03/18/2008 0435   HGB 13.2 03/10/2008 0935   HCT 35.8 06/30/2014 1448   HCT 39.6 12/29/2013 1305   HCT 29.2 (L) 03/19/2008 0505   HCT 31.1 (L) 03/18/2008 0435   HCT 40.0 03/10/2008 0935   MCV 82.2 06/30/2014 1448   MCV 86.8 12/29/2013 1305   MCV 88.9 03/19/2008 0505   MCV 88.0 03/18/2008 0435   MCV 89.6 03/10/2008 0935    Lipid Panel  No results found for: CHOL, TRIG, HDL, CHOLHDL, VLDL, LDLCALC, LDLDIRECT  ABG No results found for: PHART, PCO2ART, PO2ART, HCO3, TCO2, ACIDBASEDEF, O2SAT   No results found for: TSH BNP (last 3 results) No results for input(s): BNP in the last 8760 hours.  ProBNP (last 3 results) No results for input(s): PROBNP in the  last 8760 hours.  Cardiac Panel (last 3 results) No results for input(s): CKTOTAL, CKMB, TROPONINI, RELINDX in the last 72 hours.  Iron/TIBC/Ferritin/ %Sat No results found for: IRON, TIBC, FERRITIN, IRONPCTSAT  EKG Orders placed or performed in visit on 10/16/15  . EKG 12-Lead     Prior Assessment and Plan Problem List as of 10/26/2015 Reviewed: 08/20/2012  5:49 PM by Arther Abbott, MD     Cardiovascular and Mediastinum   HYPERTENSION, BENIGN     Musculoskeletal and Integument   GEN OSTEOARTHROSIS INVOLVING MULTIPLE SITES   Hip bursitis   Trigger finger, acquired     Other   DYSLIPIDEMIA   ANXIETY DISORDER   TOBACCO USER   CHEST DISCOMFORT, ATYPICAL       Imaging: Ct Lumbar Spine W Contrast  Result Date: 10/25/2015 CLINICAL DATA:  Low back pain radiating into the right buttock. Right calf cramping. Recent onset of left lower extremity pain and left calf cramping. EXAM: LUMBAR MYELOGRAM FLUOROSCOPY TIME:  Radiation Exposure Index (as provided by the fluoroscopic device): 286.08 microGray*m^2 Fluoroscopy Time (in minutes and seconds):  24 seconds PROCEDURE: After thorough discussion of risks and benefits of the procedure including bleeding, infection, injury to nerves, blood vessels, adjacent structures as well as headache and CSF leak, written and oral informed consent was obtained. Consent was obtained by Dr. Logan Bores. Time out form was completed. Patient was positioned prone on the fluoroscopy table. Local anesthesia was provided with 1% lidocaine without epinephrine after prepped and draped in the usual sterile fashion. Puncture was performed at L2-3 using a 5 inch 22-gauge spinal needle via a left interlaminar approach. Using a single pass through the dura, the needle was placed within the thecal sac, with return of clear CSF. 15 mL of Isovue M 200 was injected into the thecal sac, with normal opacification of the nerve roots and cauda equina consistent with free flow  within the subarachnoid space. I personally performed the lumbar puncture and administered the intrathecal contrast. I also personally supervised acquisition of the myelogram images. TECHNIQUE: Contiguous axial images were obtained through the Lumbar spine after the intrathecal infusion of infusion. Coronal and sagittal reconstructions were obtained of the axial image sets. COMPARISON:  Lumbar spine MRI 11/23/2004. Chest radiographs 03/10/2008. FINDINGS: LUMBAR MYELOGRAM FINDINGS: There are 4 non rib-bearing lumbar type vertebral bodies. Comparison chest radiographs suggest that the lowest rib-bearing vertebra is T12. Therefore, L5 will be considered completely sacralized. There is minimal retrolisthesis of T12 on L1-L1 on L2 there is grade 1 anterolisthesis of L3 on L4 of approximately 7 mm without significant change during flexion or extension. There is evidence of right lateral recess narrowing at L1-2. Small ventral extradural defects are present at T12-L1 and L1-2 without evidence of significant spinal stenosis. Abdominal aortic atherosclerosis is noted. Prior right hip arthroplasty partially visualized. CT LUMBAR MYELOGRAM FINDINGS: There is mild thoracolumbar levoscoliosis. There is minimal retrolisthesis of T12 on L1 an L1 on L2. Grade 1 retrolisthesis at anterolisthesis of L3 on L4 measure 6 mm and is new from the prior MRI, appearing facet mediated. Vertebral body heights are preserved. Vacuum disc phenomenon is present from T11-12-L1-2 as well as at L4-5. Disc space narrowing is severe at L4-5, mild at L1-2, and mild-to-moderate at T11-12 and T12-L1. The conus medullaris terminates at T12-L1. Cholecystectomy clips and diffuse abdominal aortic atherosclerosis are noted without aneurysm. T11-12: Mild facet arthrosis and minimal disc bulging without stenosis, unchanged. T12-L1: Mildly progressive disc bulging and mild facet arthrosis result in minimal right lateral recess narrowing without spinal or neural  foraminal stenosis. L1-2: New, mild disc bulging asymmetric to the right and mild facet arthrosis result in minimal right lateral recess narrowing without  spinal or neural foraminal stenosis. L2-3: Moderate facet hypertrophy and trace disc bulging without stenosis, similar to prior. L3-4: New listhesis with mild bulging of uncovered disc and progressive, severe facet arthrosis without stenosis. L4-5: Disc bulging and spurring asymmetric to the left, disc space height loss, and mild right and moderate to severe left facet arthrosis result in mild right and moderate left neural foraminal stenosis, mildly progressed on the left from the prior MRI. No spinal stenosis. Disc and osteophyte could affect the left L4 nerve lateral to the foramen. L5-S1: Transitional level with sacralized L5 and no significant disc. No stenosis. IMPRESSION: 1. Transitional lumbosacral anatomy as above. 2. Progressive, severe facet arthrosis at L3-4 with new grade 1 anterolisthesis. No stenosis. 3. Advanced disc and left facet degeneration at L4-5 with moderate left neural foraminal stenosis, mildly progressed. 4. New, minimal right lateral recess narrowing at T12-L1 and L1-2 due to disc bulging. 5. Aortic atherosclerosis. Electronically Signed   By: Logan Bores M.D.   On: 10/25/2015 16:59   Nm Myocar Multi W/spect W/wall Motion / Ef  Result Date: 10/20/2015  There was no ST segment deviation noted during stress.  No T wave inversion was noted during stress.  Findings consistent with moderate inferior ischemia.  The left ventricular ejection fraction is hyperdynamic (>65%).  This is an intermediate risk study.    Dg Myelography Lumbar Inj Lumbosacral  Result Date: 10/25/2015 CLINICAL DATA:  Low back pain radiating into the right buttock. Right calf cramping. Recent onset of left lower extremity pain and left calf cramping. EXAM: LUMBAR MYELOGRAM FLUOROSCOPY TIME:  Radiation Exposure Index (as provided by the fluoroscopic device):  286.08 microGray*m^2 Fluoroscopy Time (in minutes and seconds):  24 seconds PROCEDURE: After thorough discussion of risks and benefits of the procedure including bleeding, infection, injury to nerves, blood vessels, adjacent structures as well as headache and CSF leak, written and oral informed consent was obtained. Consent was obtained by Dr. Logan Bores. Time out form was completed. Patient was positioned prone on the fluoroscopy table. Local anesthesia was provided with 1% lidocaine without epinephrine after prepped and draped in the usual sterile fashion. Puncture was performed at L2-3 using a 5 inch 22-gauge spinal needle via a left interlaminar approach. Using a single pass through the dura, the needle was placed within the thecal sac, with return of clear CSF. 15 mL of Isovue M 200 was injected into the thecal sac, with normal opacification of the nerve roots and cauda equina consistent with free flow within the subarachnoid space. I personally performed the lumbar puncture and administered the intrathecal contrast. I also personally supervised acquisition of the myelogram images. TECHNIQUE: Contiguous axial images were obtained through the Lumbar spine after the intrathecal infusion of infusion. Coronal and sagittal reconstructions were obtained of the axial image sets. COMPARISON:  Lumbar spine MRI 11/23/2004. Chest radiographs 03/10/2008. FINDINGS: LUMBAR MYELOGRAM FINDINGS: There are 4 non rib-bearing lumbar type vertebral bodies. Comparison chest radiographs suggest that the lowest rib-bearing vertebra is T12. Therefore, L5 will be considered completely sacralized. There is minimal retrolisthesis of T12 on L1-L1 on L2 there is grade 1 anterolisthesis of L3 on L4 of approximately 7 mm without significant change during flexion or extension. There is evidence of right lateral recess narrowing at L1-2. Small ventral extradural defects are present at T12-L1 and L1-2 without evidence of significant spinal  stenosis. Abdominal aortic atherosclerosis is noted. Prior right hip arthroplasty partially visualized. CT LUMBAR MYELOGRAM FINDINGS: There is mild thoracolumbar levoscoliosis. There is  minimal retrolisthesis of T12 on L1 an L1 on L2. Grade 1 retrolisthesis at anterolisthesis of L3 on L4 measure 6 mm and is new from the prior MRI, appearing facet mediated. Vertebral body heights are preserved. Vacuum disc phenomenon is present from T11-12-L1-2 as well as at L4-5. Disc space narrowing is severe at L4-5, mild at L1-2, and mild-to-moderate at T11-12 and T12-L1. The conus medullaris terminates at T12-L1. Cholecystectomy clips and diffuse abdominal aortic atherosclerosis are noted without aneurysm. T11-12: Mild facet arthrosis and minimal disc bulging without stenosis, unchanged. T12-L1: Mildly progressive disc bulging and mild facet arthrosis result in minimal right lateral recess narrowing without spinal or neural foraminal stenosis. L1-2: New, mild disc bulging asymmetric to the right and mild facet arthrosis result in minimal right lateral recess narrowing without spinal or neural foraminal stenosis. L2-3: Moderate facet hypertrophy and trace disc bulging without stenosis, similar to prior. L3-4: New listhesis with mild bulging of uncovered disc and progressive, severe facet arthrosis without stenosis. L4-5: Disc bulging and spurring asymmetric to the left, disc space height loss, and mild right and moderate to severe left facet arthrosis result in mild right and moderate left neural foraminal stenosis, mildly progressed on the left from the prior MRI. No spinal stenosis. Disc and osteophyte could affect the left L4 nerve lateral to the foramen. L5-S1: Transitional level with sacralized L5 and no significant disc. No stenosis. IMPRESSION: 1. Transitional lumbosacral anatomy as above. 2. Progressive, severe facet arthrosis at L3-4 with new grade 1 anterolisthesis. No stenosis. 3. Advanced disc and left facet  degeneration at L4-5 with moderate left neural foraminal stenosis, mildly progressed. 4. New, minimal right lateral recess narrowing at T12-L1 and L1-2 due to disc bulging. 5. Aortic atherosclerosis. Electronically Signed   By: Logan Bores M.D.   On: 10/25/2015 16:59

## 2015-10-27 ENCOUNTER — Telehealth: Payer: Self-pay | Admitting: Adult Health

## 2015-10-27 ENCOUNTER — Other Ambulatory Visit (HOSPITAL_COMMUNITY)
Admission: RE | Admit: 2015-10-27 | Discharge: 2015-10-27 | Disposition: A | Discharge status: Home / Self Care | Payer: BLUE CROSS/BLUE SHIELD | Source: Ambulatory Visit | Attending: Adult Health | Admitting: Adult Health

## 2015-10-27 ENCOUNTER — Telehealth: Payer: Self-pay

## 2015-10-27 ENCOUNTER — Ambulatory Visit (HOSPITAL_COMMUNITY)
Admission: RE | Admit: 2015-10-27 | Discharge: 2015-10-27 | Disposition: A | Discharge status: Home / Self Care | Payer: BLUE CROSS/BLUE SHIELD | Source: Ambulatory Visit | Attending: Adult Health | Admitting: Adult Health

## 2015-10-27 DIAGNOSIS — I700 Atherosclerosis of aorta: Secondary | ICD-10-CM | POA: Insufficient documentation

## 2015-10-27 DIAGNOSIS — Z01818 Encounter for other preprocedural examination: Secondary | ICD-10-CM | POA: Insufficient documentation

## 2015-10-27 DIAGNOSIS — Z720 Tobacco use: Secondary | ICD-10-CM | POA: Insufficient documentation

## 2015-10-27 LAB — CBC WITH DIFFERENTIAL/PLATELET
Basophils Absolute: 0.1 10*3/uL (ref 0.0–0.1)
Basophils Relative: 1 %
EOS ABS: 0.7 10*3/uL (ref 0.0–0.7)
Eosinophils Relative: 8 %
HCT: 39 % (ref 36.0–46.0)
HEMOGLOBIN: 11.9 g/dL — AB (ref 12.0–15.0)
LYMPHS ABS: 3.3 10*3/uL (ref 0.7–4.0)
LYMPHS PCT: 36 %
MCH: 26.2 pg (ref 26.0–34.0)
MCHC: 30.5 g/dL (ref 30.0–36.0)
MCV: 85.7 fL (ref 78.0–100.0)
Monocytes Absolute: 0.8 10*3/uL (ref 0.1–1.0)
Monocytes Relative: 9 %
NEUTROS PCT: 46 %
Neutro Abs: 4.4 10*3/uL (ref 1.7–7.7)
Platelets: 232 10*3/uL (ref 150–400)
RBC: 4.55 MIL/uL (ref 3.87–5.11)
RDW: 16.2 % — ABNORMAL HIGH (ref 11.5–15.5)
WBC: 9.2 10*3/uL (ref 4.0–10.5)

## 2015-10-27 LAB — BASIC METABOLIC PANEL
ANION GAP: 6 (ref 5–15)
BUN: 20 mg/dL (ref 6–20)
CHLORIDE: 103 mmol/L (ref 101–111)
CO2: 27 mmol/L (ref 22–32)
Calcium: 8.8 mg/dL — ABNORMAL LOW (ref 8.9–10.3)
Creatinine, Ser: 0.96 mg/dL (ref 0.44–1.00)
GFR calc non Af Amer: >60 mL/min (ref >60)
Glucose, Bld: 93 mg/dL (ref 65–99)
POTASSIUM: 3.8 mmol/L (ref 3.5–5.1)
SODIUM: 136 mmol/L (ref 135–145)

## 2015-10-27 LAB — PROTIME-INR
INR: 0.92
PROTHROMBIN TIME: 12.3 s (ref 11.4–15.2)

## 2015-10-27 NOTE — Telephone Encounter (Signed)
Spoke with patient after her myelogram here 10/25/15, and she says she is doing great.  She says each and every one of [place] treated her "just so special!"  jkl

## 2015-10-27 NOTE — Telephone Encounter (Signed)
Pt called stating she wanted to cancel her cath--pt states she and her husband were happy w/ what [facility] told her and she would like to cancel.

## 2015-10-27 NOTE — Telephone Encounter (Signed)
Pt decided to keep cath apt

## 2015-10-27 NOTE — Telephone Encounter (Signed)
Will forward to [employer]

## 2015-10-27 NOTE — Telephone Encounter (Signed)
If she has continued symptoms she will need to have cardiac cath. Do not want to have unclear and incomplete evaluation of her chest pain. This is especially true if she needs to use NTG.

## 2015-10-31 ENCOUNTER — Encounter (HOSPITAL_COMMUNITY)
Admission: RE | Disposition: A | Discharge status: Home / Self Care | Payer: Self-pay | Source: Ambulatory Visit | Attending: Cardiology

## 2015-10-31 ENCOUNTER — Ambulatory Visit (HOSPITAL_COMMUNITY)
Admission: RE | Admit: 2015-10-31 | Discharge: 2015-10-31 | Disposition: A | Discharge status: Home / Self Care | Payer: BLUE CROSS/BLUE SHIELD | Source: Ambulatory Visit | Attending: Cardiology | Admitting: Cardiology

## 2015-10-31 ENCOUNTER — Encounter (HOSPITAL_COMMUNITY): Payer: Self-pay | Admitting: Cardiology

## 2015-10-31 DIAGNOSIS — R0789 Other chest pain: Secondary | ICD-10-CM | POA: Diagnosis not present

## 2015-10-31 DIAGNOSIS — F419 Anxiety disorder, unspecified: Secondary | ICD-10-CM | POA: Insufficient documentation

## 2015-10-31 DIAGNOSIS — R9439 Abnormal result of other cardiovascular function study: Secondary | ICD-10-CM | POA: Diagnosis not present

## 2015-10-31 DIAGNOSIS — F329 Major depressive disorder, single episode, unspecified: Secondary | ICD-10-CM | POA: Diagnosis not present

## 2015-10-31 DIAGNOSIS — I10 Essential (primary) hypertension: Secondary | ICD-10-CM | POA: Insufficient documentation

## 2015-10-31 DIAGNOSIS — G8929 Other chronic pain: Secondary | ICD-10-CM | POA: Insufficient documentation

## 2015-10-31 DIAGNOSIS — Z823 Family history of stroke: Secondary | ICD-10-CM | POA: Insufficient documentation

## 2015-10-31 DIAGNOSIS — I208 Other forms of angina pectoris: Secondary | ICD-10-CM | POA: Diagnosis not present

## 2015-10-31 DIAGNOSIS — J309 Allergic rhinitis, unspecified: Secondary | ICD-10-CM | POA: Diagnosis not present

## 2015-10-31 DIAGNOSIS — F17210 Nicotine dependence, cigarettes, uncomplicated: Secondary | ICD-10-CM | POA: Insufficient documentation

## 2015-10-31 DIAGNOSIS — R079 Chest pain, unspecified: Secondary | ICD-10-CM | POA: Diagnosis present

## 2015-10-31 DIAGNOSIS — Z8249 Family history of ischemic heart disease and other diseases of the circulatory system: Secondary | ICD-10-CM | POA: Insufficient documentation

## 2015-10-31 DIAGNOSIS — M1990 Unspecified osteoarthritis, unspecified site: Secondary | ICD-10-CM | POA: Diagnosis not present

## 2015-10-31 DIAGNOSIS — Z8719 Personal history of other diseases of the digestive system: Secondary | ICD-10-CM | POA: Insufficient documentation

## 2015-10-31 DIAGNOSIS — M549 Dorsalgia, unspecified: Secondary | ICD-10-CM | POA: Diagnosis not present

## 2015-10-31 DIAGNOSIS — Z880 Allergy status to penicillin: Secondary | ICD-10-CM | POA: Insufficient documentation

## 2015-10-31 DIAGNOSIS — Z96641 Presence of right artificial hip joint: Secondary | ICD-10-CM | POA: Diagnosis not present

## 2015-10-31 DIAGNOSIS — E7800 Pure hypercholesterolemia, unspecified: Secondary | ICD-10-CM | POA: Diagnosis not present

## 2015-10-31 DIAGNOSIS — Z882 Allergy status to sulfonamides status: Secondary | ICD-10-CM | POA: Diagnosis not present

## 2015-10-31 DIAGNOSIS — Z01818 Encounter for other preprocedural examination: Secondary | ICD-10-CM

## 2015-10-31 HISTORY — PX: CARDIAC CATHETERIZATION: SHX172

## 2015-10-31 SURGERY — LEFT HEART CATH AND CORONARY ANGIOGRAPHY

## 2015-10-31 MED ORDER — HEPARIN SODIUM (PORCINE) 1000 UNIT/ML IJ SOLN
INTRAMUSCULAR | Status: DC | PRN
  Administered 2015-10-31: 5000 [IU] via INTRAVENOUS

## 2015-10-31 MED ORDER — IOPAMIDOL (ISOVUE-370) INJECTION 76%
INTRAVENOUS | Status: DC | PRN
  Administered 2015-10-31: 80 mL via INTRA_ARTERIAL

## 2015-10-31 MED ORDER — HEPARIN (PORCINE) IN NACL 2-0.9 UNIT/ML-% IJ SOLN
INTRAMUSCULAR | Status: AC
  Filled 2015-10-31: qty 1000

## 2015-10-31 MED ORDER — VERAPAMIL HCL 2.5 MG/ML IV SOLN
INTRAVENOUS | Status: DC | PRN
  Administered 2015-10-31: 08:00:00 via INTRA_ARTERIAL

## 2015-10-31 MED ORDER — ASPIRIN 81 MG PO CHEW
81.0000 mg | CHEWABLE_TABLET | ORAL | Status: AC
Start: 2015-10-31 — End: 2015-10-31
  Administered 2015-10-31: 81 mg via ORAL

## 2015-10-31 MED ORDER — ACETAMINOPHEN 325 MG PO TABS: 650.0000 mg | ORAL_TABLET | ORAL | Status: DC | PRN

## 2015-10-31 MED ORDER — SODIUM CHLORIDE 0.9% FLUSH: 3.0000 mL | Freq: Two times a day (BID) | INTRAVENOUS | Status: DC

## 2015-10-31 MED ORDER — SODIUM CHLORIDE 0.9% FLUSH: 3.0000 mL | INTRAVENOUS | Status: DC | PRN

## 2015-10-31 MED ORDER — HEPARIN (PORCINE) IN NACL 2-0.9 UNIT/ML-% IJ SOLN
INTRAMUSCULAR | Status: DC | PRN
  Administered 2015-10-31: 1500 mL via INTRA_ARTERIAL

## 2015-10-31 MED ORDER — MIDAZOLAM HCL 2 MG/2ML IJ SOLN
INTRAMUSCULAR | Status: DC | PRN
  Administered 2015-10-31: 2 mg via INTRAVENOUS

## 2015-10-31 MED ORDER — SODIUM CHLORIDE 0.9 % WEIGHT BASED INFUSION
3.0000 mL/kg/h | INTRAVENOUS | Status: DC
  Administered 2015-10-31: 3 mL/kg/h via INTRAVENOUS

## 2015-10-31 MED ORDER — SODIUM CHLORIDE 0.9 % IV SOLN: 250.0000 mL | INTRAVENOUS | Status: DC | PRN

## 2015-10-31 MED ORDER — LIDOCAINE HCL (PF) 1 % IJ SOLN
INTRAMUSCULAR | Status: AC
  Filled 2015-10-31: qty 30

## 2015-10-31 MED ORDER — MIDAZOLAM HCL 2 MG/2ML IJ SOLN
INTRAMUSCULAR | Status: AC
  Filled 2015-10-31: qty 2

## 2015-10-31 MED ORDER — ASPIRIN 81 MG PO CHEW
CHEWABLE_TABLET | ORAL | Status: AC
  Filled 2015-10-31: qty 1

## 2015-10-31 MED ORDER — LIDOCAINE HCL (PF) 1 % IJ SOLN
INTRAMUSCULAR | Status: DC | PRN
  Administered 2015-10-31: 2 mL via INTRADERMAL

## 2015-10-31 MED ORDER — IOPAMIDOL (ISOVUE-370) INJECTION 76%
INTRAVENOUS | Status: AC
  Filled 2015-10-31: qty 100

## 2015-10-31 MED ORDER — VERAPAMIL HCL 2.5 MG/ML IV SOLN
INTRAVENOUS | Status: AC
  Filled 2015-10-31: qty 2

## 2015-10-31 MED ORDER — SODIUM CHLORIDE 0.9 % IV SOLN
INTRAVENOUS | Status: DC
  Administered 2015-10-31: 07:00:00 via INTRAVENOUS

## 2015-10-31 MED ORDER — FENTANYL CITRATE (PF) 100 MCG/2ML IJ SOLN
INTRAMUSCULAR | Status: DC | PRN
  Administered 2015-10-31: 25 ug via INTRAVENOUS

## 2015-10-31 MED ORDER — HEPARIN SODIUM (PORCINE) 1000 UNIT/ML IJ SOLN
INTRAMUSCULAR | Status: AC
  Filled 2015-10-31: qty 1

## 2015-10-31 MED ORDER — ONDANSETRON HCL 4 MG/2ML IJ SOLN: 4.0000 mg | Freq: Four times a day (QID) | INTRAMUSCULAR | Status: DC | PRN

## 2015-10-31 MED ORDER — FENTANYL CITRATE (PF) 100 MCG/2ML IJ SOLN
INTRAMUSCULAR | Status: AC
  Filled 2015-10-31: qty 2

## 2015-10-31 SURGICAL SUPPLY — 10 items
CATH INFINITI 5FR ANG PIGTAIL (CATHETERS) ×3 IMPLANT
CATH OPTITORQUE TIG 4.0 5F (CATHETERS) ×3 IMPLANT
DEVICE RAD COMP TR BAND LRG (VASCULAR PRODUCTS) ×3 IMPLANT
GLIDESHEATH SLEND A-KIT 6F 22G (SHEATH) ×3 IMPLANT
KIT HEART LEFT (KITS) ×3 IMPLANT
PACK CARDIAC CATHETERIZATION (CUSTOM PROCEDURE TRAY) ×3 IMPLANT
SYR MEDRAD MARK V 150ML (SYRINGE) ×3 IMPLANT
TRANSDUCER W/STOPCOCK (MISCELLANEOUS) ×3 IMPLANT
TUBING CIL FLEX 10 FLL-RA (TUBING) ×3 IMPLANT
WIRE SAFE-T 1.5MM-J .035X260CM (WIRE) ×3 IMPLANT

## 2015-10-31 NOTE — Progress Notes (Signed)
Pt states is having chest pain at at 3/10. States it is the same pain that she has been having.  O2 started at 2 liters per protocal. Pt refuses to take a nitroglycerin.

## 2015-10-31 NOTE — Interval H&P Note (Signed)
History and Physical Interval Note:  10/31/2015 7:24 AM  [name]  has presented today for surgery, with the diagnosis of abnormal stress test - for CP/atypical angina Sx  The various methods of treatment have been discussed with the patient and family. After consideration of risks, benefits and other options for treatment, the patient has consented to  Procedure(s): Left Heart Cath and Coronary Angiography (N/A) as a surgical intervention .  The patient's history has been reviewed, patient examined, no change in status, stable for surgery.  I have reviewed the patient's chart and labs.  Questions were answered to the patient's satisfaction.    Cath Lab Visit (complete for each Cath Lab visit)  Clinical Evaluation Leading to the Procedure:   ACS: No.  Non-ACS:    Anginal Classification: CCS III  Anti-ischemic medical therapy: Minimal Therapy (1 class of medications)  Non-Invasive Test Results: Intermediate-risk stress test findings: cardiac mortality 1-3%/year  Prior CABG: No previous CABG    [name]

## 2015-10-31 NOTE — Discharge Instructions (Signed)
Radial Site Care °Refer to this sheet in the next few weeks. These instructions provide you with information about caring for yourself after your procedure. Your health care provider may also give you more specific instructions. Your treatment has been planned according to current medical practices, but problems sometimes occur. Call your health care provider if you have any problems or questions after your procedure. °WHAT TO EXPECT AFTER THE PROCEDURE °After your procedure, it is typical to have the following: °· Bruising at the radial site that usually fades within 1-2 weeks. °· Blood collecting in the tissue (hematoma) that may be painful to the touch. It should usually decrease in size and tenderness within 1-2 weeks. °HOME CARE INSTRUCTIONS °· Take medicines only as directed by your health care provider. °· You may shower 24-48 hours after the procedure or as directed by your health care provider. Remove the bandage (dressing) and gently wash the site with plain soap and water. Pat the area dry with a clean towel. Do not rub the site, because this may cause bleeding. °· Do not take baths, swim, or use a hot tub until your health care provider approves. °· Check your insertion site every day for redness, swelling, or drainage. °· Do not apply powder or lotion to the site. °· Do not flex or bend the affected arm for 24 hours or as directed by your health care provider. °· Do not push or pull heavy objects with the affected arm for 24 hours or as directed by your health care provider. °· Do not lift over 10 lb (4.5 kg) for 5 days after your procedure or as directed by your health care provider. °· Ask your health care provider when it is okay to: °¨ Return to work or school. °¨ Resume usual physical activities or sports. °¨ Resume sexual activity. °· Do not drive home if you are discharged the same day as the procedure. Have someone else drive you. °· You may drive 24 hours after the procedure unless otherwise  instructed by your health care provider. °· Do not operate machinery or power tools for 24 hours after the procedure. °· If your procedure was done as an outpatient procedure, which means that you went home the same day as your procedure, a responsible adult should be with you for the first 24 hours after you arrive home. °· Keep all follow-up visits as directed by your health care provider. This is important. °SEEK MEDICAL CARE IF: °· You have a fever. °· You have chills. °· You have increased bleeding from the radial site. Hold pressure on the site. °SEEK IMMEDIATE MEDICAL CARE IF: °· You have unusual pain at the radial site. °· You have redness, warmth, or swelling at the radial site. °· You have drainage (other than a small amount of blood on the dressing) from the radial site. °· The radial site is bleeding, and the bleeding does not stop after 30 minutes of holding steady pressure on the site. °· Your arm or hand becomes pale, cool, tingly, or numb. °  °This information is not intended to replace advice given to you by your health care provider. Make sure you discuss any questions you have with your health care provider. °  °Document Released: 02/16/2010 Document Revised: 02/04/2014 Document Reviewed: 08/02/2013 °[employer]vier Interactive Patient Education ©2016 [employer] ° °

## 2015-10-31 NOTE — H&P (View-Only) (Signed)
Cardiology Office Note   Date:  10/26/2015   ID:  [name], DOB October 03, 1955, MRN [id number]  PCP:  Deloria Lair, MD  Cardiologist: [place]/  Jory Sims, NP   No chief complaint on file.     History of Present Illness: [name] is a 60 y.o. female who presents for ongoing assessment and management of chest pain, and was initially seen by Dr. Domenic Polite on 10/16/2015. Previous records revealed that she had a cardiac catheterization on 11/23/2008 which showed no significant CAD and normal LVEF. EKG at that time revealed normal sinus rhythm with decreased R-wave progression and nonspecific T-wave abnormalities. The patient also has a significant amount of anxiety. She was scheduled for [facility]. No medications were changed at that time. She did have symptoms of gastritis and if ischemic testing was reassuring she would be considered for GI evaluation.  NM Stress test on 10/20/2015 revealed.    There was no ST segment deviation noted during stress.  No T wave inversion was noted during stress.  Findings consistent with moderate inferior ischemia.  The left ventricular ejection fraction is hyperdynamic (>65%).  This is an intermediate risk study.   She constantly day tearful with multiple somatic complaints and discussion of a lot of family dysfunction and issues. She continues to feel tired and having chronic dyspnea on exertion. She unfortunately continues to smoke.  Past Medical History:  Diagnosis Date  . Allergic rhinitis   . Anxiety   . Chronic back pain   . Depression   . Essential hypertension   . History of gastritis    EGD 2015  . Osteoarthritis   . Spinal stenosis     Past Surgical History:  Procedure Laterality Date  . APPENDECTOMY     1985  . CARPAL TUNNEL RELEASE Left   . CARPAL TUNNEL RELEASE Right   . TONSILLECTOMY    . TOTAL ABDOMINAL HYSTERECTOMY     1985  . TOTAL HIP ARTHROPLASTY Right    Original surgery 2006 with revision  2010  . ULNAR NERVE TRANSPOSITION       Current Outpatient Prescriptions  Medication Sig Dispense Refill  . ALPRAZolam (XANAX) 1 MG tablet Take 1 mg by mouth 4 (four) times daily.    Marland Kitchen atorvastatin (LIPITOR) 20 MG tablet Take 20 mg by mouth daily.    Marland Kitchen HYDROcodone-acetaminophen (LORCET) 10-650 MG per tablet Take 1 tablet by mouth every 6 (six) hours as needed. Pain    . levothyroxine (SYNTHROID, LEVOTHROID) 75 MCG tablet Take 75 mcg by mouth daily before breakfast.    . meloxicam (MOBIC) 15 MG tablet Take 15 mg by mouth daily.    Marland Kitchen omeprazole (PRILOSEC) 20 MG capsule Take 20 mg by mouth daily.    . potassium chloride (K-DUR) 10 MEQ tablet Take 10 mEq by mouth daily.    . ranitidine (ZANTAC) 150 MG capsule Take 150 mg by mouth 2 (two) times daily.    . sertraline (ZOLOFT) 100 MG tablet Take 150 mg by mouth daily.     Marland Kitchen triamterene-hydrochlorothiazide (DYAZIDE) 37.5-25 MG per capsule Take 1 capsule by mouth every morning.    . nitroGLYCERIN (NITROSTAT) 0.4 MG SL tablet Place 1 tablet (0.4 mg total) under the tongue every 5 (five) minutes as needed for chest pain. 25 tablet 3   No current facility-administered medications for this visit.     Allergies:   Advil [ibuprofen]; Aleve [naproxen sodium]; Codeine; Penicillins; and Sulfonamide derivatives    Social History:  The patient  reports that she has been smoking.  She uses smokeless tobacco. She reports that she does not drink alcohol or use drugs.   Family History:  The patient's family history includes COPD in her mother; Cerebral aneurysm in her brother; Heart attack in her father; Lung disease in her father.    ROS: All other systems are reviewed and negative. Unless otherwise mentioned in H&P    PHYSICAL EXAM: VS:  BP 120/66   Pulse 79   Ht '5\' 2"'$  (1.575 m)   Wt 209 lb (94.8 kg)   SpO2 97%   BMI 38.23 kg/m  , BMI Body mass index is 38.23 kg/m. GEN: Well nourished, well developed, in no acute distress  HEENT: normal  Neck:  no JVD, carotid bruits, or masses Cardiac: RRR; no murmurs, rubs, or gallops,no edema  Respiratory:  clear to auscultation bilaterally, normal work of breathing GI: soft, nontender, nondistended, + BS MS: no deformity or atrophy  Skin: warm and dry, no rash Neuro:  Strength and sensation are intact Psych: euthymic mood, full affect  Recent Labs: No results found for requested labs within last 8760 hours.    Lipid Panel No results found for: CHOL, TRIG, HDL, CHOLHDL, VLDL, LDLCALC, LDLDIRECT    Wt Readings from Last 3 Encounters:  10/26/15 209 lb (94.8 kg)  10/16/15 213 lb 6.4 oz (96.8 kg)  07/07/14 230 lb (104.3 kg)     ASSESSMENT AND PLAN:  1. Abnormal nuclear medicine stress test: Symptoms of fatigue and angina. I've discussed this with Dr. Domenic Polite on site, and we will plan a left heart cath with coronary angiography and possible intervention. I've explained this procedure in detail to include risks and benefits and she is willing to proceed. She is aware that she may stay the night at a intervention is completed.  This will be completed on 10/31/2015 at 7:30 AM with Dr. Ellyn Hack. No medications are to be held at this time. I've given her a prescription for sublingual nitroglycerin to be used when necessary for chest discomfort.  2. Hypercholesterolemia: She will continue statin therapy Lipitor 20 mg daily. This may need to be increased should she have intervention.  3. Hypertension: Blood pressure is well controlled currently. We'll continue current medication regimen.  4. Ongoing tobacco abuse: Patient is smoking one pack a day. I've calcitriol her on smoking cessation. With her current family stressors she is not inclined to stop smoking at this time. This will be reinforced on follow-up visits especially if an intervention is necessary during cardiac catheterization.   Current medicines are reviewed at length with the patient today.  I spent approximately 30 minutes with  this patient going over her current symptoms commonly family stressors, offering reassurance, and explanation of catheterization.  Labs/ tests ordered today include: Cardiac catheterization  Orders Placed This Encounter  Procedures  . DG Chest 2 View  . Basic Metabolic Panel (BMET)  . CBC with Differential  . INR/PT     Disposition:   FU with Cardiology post catheterization.  Signed, Jory Sims, NP  10/26/2015 5:14 PM    [facility] [address] [place], [place] [address] Phone: [phone]; Fax: [phone]

## 2015-10-31 NOTE — Research (Signed)
CADLAD Study Informed Consent   Subject Name: [name]  Subject met inclusion and exclusion criteria.  The informed consent form, study requirements and expectations were reviewed with the subject and questions and concerns were addressed prior to the signing of the consent form.  The subject verbalized understanding of the trail requirements.  The subject agreed to participate in the CADLAD trial and signed the informed consent.  The informed consent was obtained prior to performance of any protocol-specific procedures for the subject.  A copy of the signed informed consent was given to the subject and a copy was placed in the subject's medical record.  Sandie Ano 10/31/2015, 7:12

## 2015-12-15 NOTE — H&P (Signed)
TOTAL HIP REVISION ADMISSION H&P  Patient is admitted for right revision total hip arthroplasty.  Subjective:  Chief Complaint:   Right hip pain  S/P THA  HPI: [name], 60 y.o. female, has a history of pain and functional disability in the right hip due to failed previous THA and patient has failed non-surgical conservative treatments for greater than 12 weeks to include NSAID's and/or analgesics, use of assistive devices and activity modification. The indications for the revision total hip arthroplasty are bearing surface wear leading to  symptomatic synovitis, implant or hip misalignment and hip instability.  Onset of symptoms was gradual starting 16+ years ago with gradually worsening course since that time.  Prior procedures on the right hip include arthroplasty.  Patient currently rates pain in the right hip at 7 out of 10 with activity.  There is night pain, worsening of pain with activity and weight bearing, trendelenberg gait, pain that interfers with activities of daily living and pain with passive range of motion. Patient has evidence of metal-on-metal prosthesis by imaging studies.  This condition presents safety issues increasing the risk of falls.   There is no current active infection.   Risks, benefits and expectations were discussed with the patient.  Risks including but not limited to the risk of anesthesia, blood clots, nerve damage, blood vessel damage, failure of the prosthesis, infection and up to and including death.  Patient understand the risks, benefits and expectations and wishes to proceed with surgery.   PCP: Deloria Lair, MD  D/C Plans:      Home  Post-op Meds:       No Rx given  Tranexamic Acid:      To be given - IV   Decadron:      Is to be given  FYI:     ASA  Norco    Patient Active Problem List   Diagnosis Date Noted  . Abnormal nuclear stress test: Intermediate Risk 10/31/2015  . Atypical angina ([place]) 10/31/2015  . Hip bursitis 08/20/2012  .  Trigger finger, acquired 08/20/2012  . TOBACCO USER 12/12/2008  . DYSLIPIDEMIA 11/17/2008  . ANXIETY DISORDER 11/16/2008  . HYPERTENSION, BENIGN 11/16/2008  . GEN OSTEOARTHROSIS INVOLVING MULTIPLE SITES 11/16/2008  . CHEST DISCOMFORT, ATYPICAL 11/16/2008   Past Medical History:  Diagnosis Date  . Allergic rhinitis   . Anxiety   . Chronic back pain   . Depression   . Essential hypertension   . History of gastritis    EGD 2015  . Osteoarthritis   . Spinal stenosis     Past Surgical History:  Procedure Laterality Date  . APPENDECTOMY     1985  . CARDIAC CATHETERIZATION N/A 10/31/2015   Procedure: Left Heart Cath and Coronary Angiography;  Surgeon: Leonie Man, MD;  Location: [facility];  Service: Cardiovascular;  Laterality: N/A;  . CARPAL TUNNEL RELEASE Left   . CARPAL TUNNEL RELEASE Right   . TONSILLECTOMY    . TOTAL ABDOMINAL HYSTERECTOMY     1985  . TOTAL HIP ARTHROPLASTY Right    Original surgery 2006 with revision 2010  . ULNAR NERVE TRANSPOSITION      No prescriptions prior to admission.   Allergies  Allergen Reactions  . Aleve [Naproxen Sodium] Other (See Comments)    Headache   . Codeine     REACTION: GI upset  . Penicillins Other (See Comments)    GI upset Has patient had a PCN reaction causing immediate rash, facial/tongue/throat swelling,  SOB or lightheadedness with hypotension: No Has patient had a PCN reaction causing severe rash involving mucus membranes or skin necrosis: No Has patient had a PCN reaction that required hospitalization No Has patient had a PCN reaction occurring within the last 10 years: No If all of the above answers are "NO", then may proceed with Cephalosporin use.   . Sulfonamide Derivatives Hives    Social History  Substance Use Topics  . Smoking status: Current Every Day Smoker  . Smokeless tobacco: Current User  . Alcohol use No    Family History  Problem Relation Age of Onset  . COPD Mother   . Lung disease  Father     Asbestosis  . Heart attack Father   . Cerebral aneurysm Brother       Review of Systems  Constitutional: Positive for malaise/fatigue.  HENT: Negative.   Eyes: Negative.   Respiratory: Positive for shortness of breath (on exertion).   Cardiovascular: Negative.   Gastrointestinal: Positive for heartburn.  Genitourinary: Negative.   Musculoskeletal: Positive for back pain and joint pain.  Skin: Negative.   Neurological: Positive for headaches.  Endo/Heme/Allergies: Positive for environmental allergies.  Psychiatric/Behavioral: Positive for depression. The patient is nervous/anxious.     Objective:  Physical Exam  Constitutional: She is oriented to [name], place, and time. She appears well-developed.  HENT:  Head: Normocephalic.  Eyes: Pupils are equal, round, and reactive to light.  Neck: Neck supple. No JVD present. No tracheal deviation present. No thyromegaly present.  Cardiovascular: Normal rate, regular rhythm, normal heart sounds and intact distal pulses.   Respiratory: Effort normal and breath sounds normal. No respiratory distress. She has no wheezes.  GI: Soft. There is no tenderness. There is no guarding.  Musculoskeletal:       Right hip: She exhibits decreased range of motion, decreased strength, tenderness, bony tenderness and laceration (healed previous incision). She exhibits no swelling and no deformity.  Lymphadenopathy:    She has no cervical adenopathy.  Neurological: She is alert and oriented to [name], place, and time.  Skin: Skin is warm and dry.  Psychiatric: She has a normal mood and affect.       Labs:  Estimated body mass index is 38.23 kg/m as calculated from the following:   Height as of 10/31/15: '5\' 2"'$  (1.575 m).   Weight as of 10/31/15: 94.8 kg (209 lb).  Imaging Review:  Plain radiographs demonstrate previous THA of the right hip(s). There is evidence of loosening of the acetabular cup.The bone quality appears to be good for  age and reported activity level.   Assessment/Plan:  Right hip(s) with failed previous arthroplasty.  The patient history, physical examination, clinical judgement of the provider and imaging studies are consistent with failure of the right hip(s), previous total hip arthroplasty. Revision total hip arthroplasty is deemed medically necessary. The treatment options including medical management, injection therapy, arthroscopy and arthroplasty were discussed at length. The risks and benefits of total hip arthroplasty were presented and reviewed. The risks due to aseptic loosening, infection, stiffness, dislocation/subluxation,  thromboembolic complications and other imponderables were discussed.  The patient acknowledged the explanation, agreed to proceed with the plan and consent was signed. Patient is being admitted for inpatient treatment for surgery, pain control, PT, OT, prophylactic antibiotics, VTE prophylaxis, progressive ambulation and ADL's and discharge planning. The patient is planning to be discharged home.      [place] Yanisa Goodgame   PA-C  12/15/2015, 9:34 PM

## 2015-12-20 NOTE — Patient Instructions (Addendum)
[name]  12/20/2015   Your procedure is scheduled on:  01/01/2016    Report to [facility] Main  Entrance take [facility]  elevators to 3rd floor to  [place] at    1000 AM.  Call this number if you have problems the morning of surgery [phone]   Remember: ONLY 1 PERSON MAY GO WITH YOU TO SHORT STAY TO GET  READY MORNING OF [place].  Do not eat food or drink liquids :After Midnight.     Take these medicines the morning of surgery with A SIP OF WATER: Xanax, Flonase, Hydrocodone if needed, Synthroid, claritin, Prilosec                                 You may not have any metal on your body including hair pins and              piercings  Do not wear jewelry, make-up, lotions, powders or perfumes, deodorant             Do not wear nail polish.  Do not shave  48 hours prior to surgery.                Do not bring valuables to the hospital. [place].  Contacts, dentures or bridgework may not be worn into surgery.  Leave suitcase in the car. After surgery it may be brought to your room.         Special Instructions: N/A              Please read over the following fact sheets you were given: _____________________________________________________________________             [facility] - Preparing for Surgery Before surgery, you can play an important role.  Because skin is not sterile, your skin needs to be as free of germs as possible.  You can reduce the number of germs on your skin by washing with CHG (chlorahexidine gluconate) soap before surgery.  CHG is an antiseptic cleaner which kills germs and bonds with the skin to continue killing germs even after washing. Please DO NOT use if you have an allergy to CHG or antibacterial soaps.  If your skin becomes reddened/irritated stop using the CHG and inform your nurse when you arrive at Short Stay. Do not shave (including legs and underarms)  for at least 48 hours prior to the first CHG shower.  You may shave your face/neck. Please follow these instructions carefully:  1.  Shower with CHG Soap the night before surgery and the  morning of Surgery.  2.  If you choose to wash your hair, wash your hair first as usual with your  normal  shampoo.  3.  After you shampoo, rinse your hair and body thoroughly to remove the  shampoo.                           4.  Use CHG as you would any other liquid soap.  You can apply chg directly  to the skin and wash  Gently with a scrungie or clean washcloth.  5.  Apply the CHG Soap to your body ONLY FROM THE NECK DOWN.   Do not use on face/ open                           Wound or open sores. Avoid contact with eyes, ears mouth and genitals (private parts).                       Wash face,  Genitals (private parts) with your normal soap.             6.  Wash thoroughly, paying special attention to the area where your surgery  will be performed.  7.  Thoroughly rinse your body with warm water from the neck down.  8.  DO NOT shower/wash with your normal soap after using and rinsing off  the CHG Soap.                9.  Pat yourself dry with a clean towel.            10.  Wear clean pajamas.            11.  Place clean sheets on your bed the night of your first shower and do not  sleep with pets. Day of Surgery : Do not apply any lotions/deodorants the morning of surgery.  Please wear clean clothes to the hospital/surgery center.  FAILURE TO FOLLOW THESE INSTRUCTIONS MAY RESULT IN THE CANCELLATION OF YOUR SURGERY PATIENT SIGNATURE_________________________________  NURSE SIGNATURE__________________________________  ________________________________________________________________________ [facility] - Preparing for Surgery Before surgery, you can play an important role.  Because skin is not sterile, your skin needs to be as free of germs as possible.  You can reduce the number of germs  on your skin by washing with CHG (chlorahexidine gluconate) soap before surgery.  CHG is an antiseptic cleaner which kills germs and bonds with the skin to continue killing germs even after washing. Please DO NOT use if you have an allergy to CHG or antibacterial soaps.  If your skin becomes reddened/irritated stop using the CHG and inform your nurse when you arrive at Short Stay. Do not shave (including legs and underarms) for at least 48 hours prior to the first CHG shower.  You may shave your face/neck. Please follow these instructions carefully:  1.  Shower with CHG Soap the night before surgery and the  morning of Surgery.  2.  If you choose to wash your hair, wash your hair first as usual with your  normal  shampoo.  3.  After you shampoo, rinse your hair and body thoroughly to remove the  shampoo.                           4.  Use CHG as you would any other liquid soap.  You can apply chg directly  to the skin and wash                       Gently with a scrungie or clean washcloth.  5.  Apply the CHG Soap to your body ONLY FROM THE NECK DOWN.   Do not use on face/ open                           Wound or  open sores. Avoid contact with eyes, ears mouth and genitals (private parts).                       Wash face,  Genitals (private parts) with your normal soap.             6.  Wash thoroughly, paying special attention to the area where your surgery  will be performed.  7.  Thoroughly rinse your body with warm water from the neck down.  8.  DO NOT shower/wash with your normal soap after using and rinsing off  the CHG Soap.                9.  Pat yourself dry with a clean towel.            10.  Wear clean pajamas.            11.  Place clean sheets on your bed the night of your first shower and do not  sleep with pets. Day of Surgery : Do not apply any lotions/deodorants the morning of surgery.  Please wear clean clothes to the hospital/surgery center.  FAILURE TO FOLLOW THESE INSTRUCTIONS  MAY RESULT IN THE CANCELLATION OF YOUR SURGERY PATIENT SIGNATURE_________________________________  NURSE SIGNATURE__________________________________  ________________________________________________________________________   [facility]  An incentive spirometer is a tool that can help keep your lungs clear and active. This tool measures how well you are filling your lungs with each breath. Taking long deep breaths may help reverse or decrease the chance of developing breathing (pulmonary) problems (especially infection) following:  A long period of time when you are unable to move or be active. BEFORE THE PROCEDURE   If the spirometer includes an indicator to show your best effort, your nurse or respiratory therapist will set it to a desired goal.  If possible, sit up straight or lean slightly forward. Try not to slouch.  Hold the incentive spirometer in an upright position. INSTRUCTIONS FOR USE  1. Sit on the edge of your bed if possible, or sit up as far as you can in bed or on a chair. 2. Hold the incentive spirometer in an upright position. 3. Breathe out normally. 4. Place the mouthpiece in your mouth and seal your lips tightly around it. 5. Breathe in slowly and as deeply as possible, raising the piston or the ball toward the top of the column. 6. Hold your breath for 3-5 seconds or for as long as possible. Allow the piston or ball to fall to the bottom of the column. 7. Remove the mouthpiece from your mouth and breathe out normally. 8. Rest for a few seconds and repeat Steps 1 through 7 at least 10 times every 1-2 hours when you are awake. Take your time and take a few normal breaths between deep breaths. 9. The spirometer may include an indicator to show your best effort. Use the indicator as a goal to work toward during each repetition. 10. After each set of 10 deep breaths, practice coughing to be sure your lungs are clear. If you have an incision (the cut made at the  time of surgery), support your incision when coughing by placing a pillow or rolled up towels firmly against it. Once you are able to get out of bed, walk around indoors and cough well. You may stop using the incentive spirometer when instructed by your caregiver.  RISKS AND COMPLICATIONS  Take your time so you do not get dizzy  or light-headed.  If you are in pain, you may need to take or ask for pain medication before doing incentive spirometry. It is harder to take a deep breath if you are having pain. AFTER USE  Rest and breathe slowly and easily.  It can be helpful to keep track of a log of your progress. Your caregiver can provide you with a simple table to help with this. If you are using the spirometer at home, follow these instructions: [place] IF:   You are having difficultly using the spirometer.  You have trouble using the spirometer as often as instructed.  Your pain medication is not giving enough relief while using the spirometer.  You develop fever of 100.5 F (38.1 C) or higher. SEEK IMMEDIATE MEDICAL CARE IF:   You cough up bloody sputum that had not been present before.  You develop fever of 102 F (38.9 C) or greater.  You develop worsening pain at or near the incision site. MAKE SURE YOU:   Understand these instructions.  Will watch your condition.  Will get help right away if you are not doing well or get worse. Document Released: 05/27/2006 Document Revised: 04/08/2011 Document Reviewed: 07/28/2006 [facility] Patient Information 2014 [place], [place].   ________________________________________________________________________

## 2015-12-25 ENCOUNTER — Encounter (HOSPITAL_COMMUNITY)
Admission: RE | Admit: 2015-12-25 | Discharge: 2015-12-25 | Disposition: A | Discharge status: Home / Self Care | Payer: BLUE CROSS/BLUE SHIELD | Source: Ambulatory Visit | Attending: Orthopedic Surgery | Admitting: Orthopedic Surgery

## 2015-12-25 ENCOUNTER — Encounter (HOSPITAL_COMMUNITY): Payer: Self-pay | Admitting: *Deleted

## 2015-12-25 DIAGNOSIS — Z01812 Encounter for preprocedural laboratory examination: Secondary | ICD-10-CM | POA: Diagnosis not present

## 2015-12-25 DIAGNOSIS — M1611 Unilateral primary osteoarthritis, right hip: Secondary | ICD-10-CM | POA: Diagnosis not present

## 2015-12-25 HISTORY — DX: Chronic obstructive pulmonary disease, unspecified: J44.9

## 2015-12-25 HISTORY — DX: Headache, unspecified: R51.9

## 2015-12-25 HISTORY — DX: Headache: R51

## 2015-12-25 HISTORY — DX: Dependence on supplemental oxygen: Z99.81

## 2015-12-25 HISTORY — DX: Scoliosis, unspecified: M41.9

## 2015-12-25 HISTORY — DX: Anemia, unspecified: D64.9

## 2015-12-25 HISTORY — DX: Gastro-esophageal reflux disease without esophagitis: K21.9

## 2015-12-25 HISTORY — DX: Hypothyroidism, unspecified: E03.9

## 2015-12-25 LAB — BASIC METABOLIC PANEL
Anion gap: 6 (ref 5–15)
BUN: 16 mg/dL (ref 6–20)
CO2: 27 mmol/L (ref 22–32)
CREATININE: 0.94 mg/dL (ref 0.44–1.00)
Calcium: 8.8 mg/dL — ABNORMAL LOW (ref 8.9–10.3)
Chloride: 108 mmol/L (ref 101–111)
Glucose, Bld: 111 mg/dL — ABNORMAL HIGH (ref 65–99)
POTASSIUM: 3.6 mmol/L (ref 3.5–5.1)
SODIUM: 141 mmol/L (ref 135–145)

## 2015-12-25 LAB — SURGICAL PCR SCREEN
MRSA, PCR: NEGATIVE
Staphylococcus aureus: NEGATIVE

## 2015-12-25 LAB — CBC
HEMATOCRIT: 37.1 % (ref 36.0–46.0)
Hemoglobin: 11.3 g/dL — ABNORMAL LOW (ref 12.0–15.0)
MCH: 25.9 pg — ABNORMAL LOW (ref 26.0–34.0)
MCHC: 30.5 g/dL (ref 30.0–36.0)
MCV: 84.9 fL (ref 78.0–100.0)
PLATELETS: 248 10*3/uL (ref 150–400)
RBC: 4.37 MIL/uL (ref 3.87–5.11)
RDW: 16.3 % — AB (ref 11.5–15.5)
WBC: 8 10*3/uL (ref 4.0–10.5)

## 2015-12-25 NOTE — Progress Notes (Signed)
Cardiac cath 10-31-15 epic Dr Domenic Polite cardiac clearance 12-05-15 on chart Chest xray 10-25-15 epic lov dr Domenic Polite cardio 10-26-15 epic ekg 10-16-15 epic

## 2015-12-25 NOTE — Progress Notes (Signed)
   12/25/15 1426  OBSTRUCTIVE SLEEP APNEA  Have you ever been diagnosed with sleep apnea through a sleep study? No  Do you snore loudly (loud enough to be heard through closed doors)?  0  Do you often feel tired, fatigued, or sleepy during the daytime (such as falling asleep during driving or talking to someone)? 1  Has anyone observed you stop breathing during your sleep? 1  Do you have, or are you being treated for high blood pressure? 1  BMI more than 35 kg/m2? 1  Age > 50 (1-yes) 1  Neck circumference greater than:Female 16 inches or larger, Female 17inches or larger? 0  Female Gender (Yes=1) 0  Obstructive Sleep Apnea Score 5

## 2016-01-01 ENCOUNTER — Encounter (HOSPITAL_COMMUNITY): Payer: Self-pay | Admitting: *Deleted

## 2016-01-01 ENCOUNTER — Inpatient Hospital Stay (HOSPITAL_COMMUNITY): Payer: BLUE CROSS/BLUE SHIELD | Admitting: Registered Nurse

## 2016-01-01 ENCOUNTER — Inpatient Hospital Stay (HOSPITAL_COMMUNITY)
Admission: RE | Admit: 2016-01-01 | Discharge: 2016-01-02 | DRG: 468 | Disposition: A | Discharge status: Home Health | Payer: BLUE CROSS/BLUE SHIELD | Source: Ambulatory Visit | Attending: Orthopedic Surgery | Admitting: Orthopedic Surgery

## 2016-01-01 ENCOUNTER — Encounter (HOSPITAL_COMMUNITY)
Admission: RE | Disposition: A | Discharge status: Home Health | Payer: Self-pay | Source: Ambulatory Visit | Attending: Orthopedic Surgery

## 2016-01-01 DIAGNOSIS — I10 Essential (primary) hypertension: Secondary | ICD-10-CM | POA: Diagnosis present

## 2016-01-01 DIAGNOSIS — E039 Hypothyroidism, unspecified: Secondary | ICD-10-CM | POA: Diagnosis present

## 2016-01-01 DIAGNOSIS — M1990 Unspecified osteoarthritis, unspecified site: Secondary | ICD-10-CM | POA: Diagnosis present

## 2016-01-01 DIAGNOSIS — M549 Dorsalgia, unspecified: Secondary | ICD-10-CM | POA: Diagnosis present

## 2016-01-01 DIAGNOSIS — Z885 Allergy status to narcotic agent status: Secondary | ICD-10-CM | POA: Diagnosis not present

## 2016-01-01 DIAGNOSIS — M419 Scoliosis, unspecified: Secondary | ICD-10-CM | POA: Diagnosis present

## 2016-01-01 DIAGNOSIS — Z882 Allergy status to sulfonamides status: Secondary | ICD-10-CM

## 2016-01-01 DIAGNOSIS — K219 Gastro-esophageal reflux disease without esophagitis: Secondary | ICD-10-CM | POA: Diagnosis present

## 2016-01-01 DIAGNOSIS — Y838 Other surgical procedures as the cause of abnormal reaction of the patient, or of later complication, without mention of misadventure at the time of the procedure: Secondary | ICD-10-CM | POA: Diagnosis present

## 2016-01-01 DIAGNOSIS — M4800 Spinal stenosis, site unspecified: Secondary | ICD-10-CM | POA: Diagnosis present

## 2016-01-01 DIAGNOSIS — J449 Chronic obstructive pulmonary disease, unspecified: Secondary | ICD-10-CM | POA: Diagnosis present

## 2016-01-01 DIAGNOSIS — G8929 Other chronic pain: Secondary | ICD-10-CM | POA: Diagnosis present

## 2016-01-01 DIAGNOSIS — F419 Anxiety disorder, unspecified: Secondary | ICD-10-CM | POA: Diagnosis present

## 2016-01-01 DIAGNOSIS — F329 Major depressive disorder, single episode, unspecified: Secondary | ICD-10-CM | POA: Diagnosis present

## 2016-01-01 DIAGNOSIS — Z880 Allergy status to penicillin: Secondary | ICD-10-CM

## 2016-01-01 DIAGNOSIS — Z96649 Presence of unspecified artificial hip joint: Secondary | ICD-10-CM

## 2016-01-01 DIAGNOSIS — Y792 Prosthetic and other implants, materials and accessory orthopedic devices associated with adverse incidents: Secondary | ICD-10-CM | POA: Diagnosis present

## 2016-01-01 DIAGNOSIS — Z886 Allergy status to analgesic agent status: Secondary | ICD-10-CM | POA: Diagnosis not present

## 2016-01-01 DIAGNOSIS — T84090A Other mechanical complication of internal right hip prosthesis, initial encounter: Secondary | ICD-10-CM | POA: Diagnosis present

## 2016-01-01 HISTORY — PX: TOTAL HIP REVISION: SHX763

## 2016-01-01 LAB — TYPE AND SCREEN
ABO/RH(D): O NEG
ANTIBODY SCREEN: NEGATIVE

## 2016-01-01 SURGERY — TOTAL HIP REVISION
Anesthesia: Monitor Anesthesia Care | Site: Hip | Laterality: Right

## 2016-01-01 MED ORDER — BUPIVACAINE IN DEXTROSE 0.75-8.25 % IT SOLN
INTRATHECAL | Status: DC | PRN
  Administered 2016-01-01: 1.8 mL via INTRATHECAL

## 2016-01-01 MED ORDER — METHOCARBAMOL 500 MG PO TABS
500.0000 mg | ORAL_TABLET | Freq: Four times a day (QID) | ORAL | Status: DC | PRN
  Administered 2016-01-02: 500 mg via ORAL
  Filled 2016-01-01: qty 1

## 2016-01-01 MED ORDER — PROPOFOL 500 MG/50ML IV EMUL
INTRAVENOUS | Status: DC | PRN
  Administered 2016-01-01: 50 ug/kg/min via INTRAVENOUS

## 2016-01-01 MED ORDER — ASPIRIN 81 MG PO CHEW
81.0000 mg | CHEWABLE_TABLET | Freq: Two times a day (BID) | ORAL | Status: DC
  Administered 2016-01-01 – 2016-01-02 (×2): 81 mg via ORAL
  Filled 2016-01-01 (×2): qty 1

## 2016-01-01 MED ORDER — PHENYLEPHRINE 40 MCG/ML (10ML) SYRINGE FOR IV PUSH (FOR BLOOD PRESSURE SUPPORT)
PREFILLED_SYRINGE | INTRAVENOUS | Status: DC | PRN
  Administered 2016-01-01 (×5): 80 ug via INTRAVENOUS

## 2016-01-01 MED ORDER — POTASSIUM CHLORIDE ER 10 MEQ PO TBCR: 10.0000 meq | EXTENDED_RELEASE_TABLET | Freq: Every day | ORAL | Status: DC

## 2016-01-01 MED ORDER — ONDANSETRON HCL 4 MG/2ML IJ SOLN
INTRAMUSCULAR | Status: AC
  Filled 2016-01-01: qty 2

## 2016-01-01 MED ORDER — POLYETHYLENE GLYCOL 3350 17 G PO PACK
17.0000 g | PACK | Freq: Two times a day (BID) | ORAL | Status: DC
  Filled 2016-01-01: qty 1

## 2016-01-01 MED ORDER — TRIAMTERENE-HCTZ 37.5-25 MG PO CAPS
1.0000 | ORAL_CAPSULE | Freq: Every day | ORAL | Status: DC
  Filled 2016-01-01: qty 1

## 2016-01-01 MED ORDER — FAMOTIDINE 20 MG PO TABS
40.0000 mg | ORAL_TABLET | Freq: Every day | ORAL | Status: DC
  Administered 2016-01-01: 40 mg via ORAL
  Filled 2016-01-01: qty 2

## 2016-01-01 MED ORDER — PANTOPRAZOLE SODIUM 40 MG PO TBEC
40.0000 mg | DELAYED_RELEASE_TABLET | Freq: Every day | ORAL | Status: DC
  Administered 2016-01-02: 40 mg via ORAL
  Filled 2016-01-01: qty 1

## 2016-01-01 MED ORDER — DEXAMETHASONE SODIUM PHOSPHATE 10 MG/ML IJ SOLN: 10.0000 mg | Freq: Once | INTRAMUSCULAR | Status: DC

## 2016-01-01 MED ORDER — LIDOCAINE 2% (20 MG/ML) 5 ML SYRINGE
INTRAMUSCULAR | Status: AC
  Filled 2016-01-01: qty 5

## 2016-01-01 MED ORDER — CEFAZOLIN SODIUM-DEXTROSE 2-4 GM/100ML-% IV SOLN
2.0000 g | INTRAVENOUS | Status: AC
  Administered 2016-01-01: 2 g via INTRAVENOUS

## 2016-01-01 MED ORDER — ONDANSETRON HCL 4 MG PO TABS: 4.0000 mg | ORAL_TABLET | Freq: Four times a day (QID) | ORAL | Status: DC | PRN

## 2016-01-01 MED ORDER — PROPOFOL 10 MG/ML IV BOLUS
INTRAVENOUS | Status: AC
  Filled 2016-01-01: qty 20

## 2016-01-01 MED ORDER — MENTHOL 3 MG MT LOZG
1.0000 | LOZENGE | OROMUCOSAL | Status: DC | PRN
Start: 2016-01-01 — End: 2016-01-02

## 2016-01-01 MED ORDER — FENTANYL CITRATE (PF) 100 MCG/2ML IJ SOLN
INTRAMUSCULAR | Status: DC | PRN
  Administered 2016-01-01: 50 ug via INTRAVENOUS

## 2016-01-01 MED ORDER — ATORVASTATIN CALCIUM 20 MG PO TABS
20.0000 mg | ORAL_TABLET | Freq: Every day | ORAL | Status: DC
  Administered 2016-01-01: 20 mg via ORAL
  Filled 2016-01-01: qty 1

## 2016-01-01 MED ORDER — METOCLOPRAMIDE HCL 5 MG/ML IJ SOLN: 5.0000 mg | Freq: Three times a day (TID) | INTRAMUSCULAR | Status: DC | PRN

## 2016-01-01 MED ORDER — SERTRALINE HCL 50 MG PO TABS
150.0000 mg | ORAL_TABLET | Freq: Every day | ORAL | Status: DC
  Administered 2016-01-01: 150 mg via ORAL
  Filled 2016-01-01: qty 3

## 2016-01-01 MED ORDER — LIDOCAINE 2% (20 MG/ML) 5 ML SYRINGE
INTRAMUSCULAR | Status: DC | PRN
  Administered 2016-01-01: 60 mg via INTRAVENOUS

## 2016-01-01 MED ORDER — FLUTICASONE PROPIONATE 50 MCG/ACT NA SUSP
2.0000 | Freq: Every day | NASAL | Status: DC
  Filled 2016-01-01: qty 16

## 2016-01-01 MED ORDER — METOCLOPRAMIDE HCL 5 MG PO TABS
5.0000 mg | ORAL_TABLET | Freq: Three times a day (TID) | ORAL | Status: DC | PRN
Start: 2016-01-01 — End: 2016-01-02

## 2016-01-01 MED ORDER — DEXAMETHASONE SODIUM PHOSPHATE 10 MG/ML IJ SOLN
INTRAMUSCULAR | Status: AC
  Filled 2016-01-01: qty 1

## 2016-01-01 MED ORDER — DIPHENHYDRAMINE HCL 25 MG PO CAPS: 25.0000 mg | ORAL_CAPSULE | Freq: Four times a day (QID) | ORAL | Status: DC | PRN

## 2016-01-01 MED ORDER — ONDANSETRON HCL 4 MG/2ML IJ SOLN: 4.0000 mg | Freq: Four times a day (QID) | INTRAMUSCULAR | Status: DC | PRN

## 2016-01-01 MED ORDER — MAGNESIUM CITRATE PO SOLN: 1.0000 | Freq: Once | ORAL | Status: DC | PRN

## 2016-01-01 MED ORDER — ONDANSETRON HCL 4 MG/2ML IJ SOLN
INTRAMUSCULAR | Status: DC | PRN
  Administered 2016-01-01 (×2): 4 mg via INTRAVENOUS

## 2016-01-01 MED ORDER — FENTANYL CITRATE (PF) 100 MCG/2ML IJ SOLN
INTRAMUSCULAR | Status: AC
  Filled 2016-01-01: qty 2

## 2016-01-01 MED ORDER — CEFAZOLIN SODIUM-DEXTROSE 2-4 GM/100ML-% IV SOLN
INTRAVENOUS | Status: AC
Start: 2016-01-01 — End: 2016-01-01
  Filled 2016-01-01: qty 100

## 2016-01-01 MED ORDER — LORATADINE 10 MG PO TABS
10.0000 mg | ORAL_TABLET | Freq: Every day | ORAL | Status: DC
  Administered 2016-01-02: 10 mg via ORAL
  Filled 2016-01-01: qty 1

## 2016-01-01 MED ORDER — FERROUS SULFATE 325 (65 FE) MG PO TABS
325.0000 mg | ORAL_TABLET | Freq: Three times a day (TID) | ORAL | Status: DC
  Administered 2016-01-02: 325 mg via ORAL
  Filled 2016-01-01: qty 1

## 2016-01-01 MED ORDER — FENTANYL CITRATE (PF) 100 MCG/2ML IJ SOLN: 25.0000 ug | INTRAMUSCULAR | Status: DC | PRN

## 2016-01-01 MED ORDER — LEVOTHYROXINE SODIUM 75 MCG PO TABS
75.0000 ug | ORAL_TABLET | Freq: Every day | ORAL | Status: DC
  Administered 2016-01-02: 75 ug via ORAL
  Filled 2016-01-01: qty 1

## 2016-01-01 MED ORDER — OXYCODONE HCL 5 MG/5ML PO SOLN
5.0000 mg | Freq: Once | ORAL | Status: DC | PRN
  Filled 2016-01-01: qty 5

## 2016-01-01 MED ORDER — HYDROMORPHONE HCL 1 MG/ML IJ SOLN
0.5000 mg | INTRAMUSCULAR | Status: DC | PRN
  Administered 2016-01-01 (×2): 0.5 mg via INTRAVENOUS
  Filled 2016-01-01 (×2): qty 0.5

## 2016-01-01 MED ORDER — OXYCODONE HCL 5 MG PO TABS: 5.0000 mg | ORAL_TABLET | Freq: Once | ORAL | Status: DC | PRN

## 2016-01-01 MED ORDER — CEFAZOLIN SODIUM-DEXTROSE 2-4 GM/100ML-% IV SOLN
2.0000 g | Freq: Four times a day (QID) | INTRAVENOUS | Status: AC
  Administered 2016-01-01 – 2016-01-02 (×2): 2 g via INTRAVENOUS
  Filled 2016-01-01 (×2): qty 100

## 2016-01-01 MED ORDER — 0.9 % SODIUM CHLORIDE (POUR BTL) OPTIME
TOPICAL | Status: DC | PRN
  Administered 2016-01-01: 1000 mL

## 2016-01-01 MED ORDER — DOCUSATE SODIUM 100 MG PO CAPS
100.0000 mg | ORAL_CAPSULE | Freq: Two times a day (BID) | ORAL | Status: DC
  Administered 2016-01-01 – 2016-01-02 (×2): 100 mg via ORAL
  Filled 2016-01-01 (×2): qty 1

## 2016-01-01 MED ORDER — MIDAZOLAM HCL 5 MG/5ML IJ SOLN
INTRAMUSCULAR | Status: DC | PRN
  Administered 2016-01-01: 2 mg via INTRAVENOUS

## 2016-01-01 MED ORDER — DEXTROSE 5 % IV SOLN
500.0000 mg | Freq: Four times a day (QID) | INTRAVENOUS | Status: DC | PRN
  Filled 2016-01-01: qty 5

## 2016-01-01 MED ORDER — LACTATED RINGERS IV SOLN
INTRAVENOUS | Status: DC | PRN
  Administered 2016-01-01 (×3): via INTRAVENOUS

## 2016-01-01 MED ORDER — BISACODYL 10 MG RE SUPP: 10.0000 mg | Freq: Every day | RECTAL | Status: DC | PRN

## 2016-01-01 MED ORDER — PHENYLEPHRINE 40 MCG/ML (10ML) SYRINGE FOR IV PUSH (FOR BLOOD PRESSURE SUPPORT)
PREFILLED_SYRINGE | INTRAVENOUS | Status: AC
  Filled 2016-01-01: qty 10

## 2016-01-01 MED ORDER — PHENOL 1.4 % MT LIQD: 1.0000 | OROMUCOSAL | Status: DC | PRN

## 2016-01-01 MED ORDER — PROPOFOL 10 MG/ML IV BOLUS
INTRAVENOUS | Status: DC | PRN
  Administered 2016-01-01 (×2): 20 mg via INTRAVENOUS

## 2016-01-01 MED ORDER — MIDAZOLAM HCL 2 MG/2ML IJ SOLN
INTRAMUSCULAR | Status: AC
  Filled 2016-01-01: qty 2

## 2016-01-01 MED ORDER — PROPOFOL 10 MG/ML IV BOLUS
INTRAVENOUS | Status: AC
Start: 2016-01-01 — End: 2016-01-01
  Filled 2016-01-01: qty 20

## 2016-01-01 MED ORDER — HYDROCODONE-ACETAMINOPHEN 10-325 MG PO TABS
1.0000 | ORAL_TABLET | ORAL | Status: DC
  Administered 2016-01-01: 1 via ORAL
  Administered 2016-01-02 (×2): 2 via ORAL
  Administered 2016-01-02: 1 via ORAL
  Administered 2016-01-02: 2 via ORAL
  Filled 2016-01-01 (×5): qty 2

## 2016-01-01 MED ORDER — CHLORHEXIDINE GLUCONATE 4 % EX LIQD: 60.0000 mL | Freq: Once | CUTANEOUS | Status: DC

## 2016-01-01 MED ORDER — TRANEXAMIC ACID 1000 MG/10ML IV SOLN
1000.0000 mg | INTRAVENOUS | Status: AC
  Administered 2016-01-01: 1000 mg via INTRAVENOUS
  Filled 2016-01-01: qty 1100

## 2016-01-01 MED ORDER — ALPRAZOLAM 1 MG PO TABS
1.0000 mg | ORAL_TABLET | Freq: Four times a day (QID) | ORAL | Status: DC
  Administered 2016-01-01 – 2016-01-02 (×4): 1 mg via ORAL
  Filled 2016-01-01 (×4): qty 1

## 2016-01-01 MED ORDER — DEXAMETHASONE SODIUM PHOSPHATE 10 MG/ML IJ SOLN
10.0000 mg | Freq: Once | INTRAMUSCULAR | Status: AC
  Administered 2016-01-01: 10 mg via INTRAVENOUS

## 2016-01-01 MED ORDER — ALUM & MAG HYDROXIDE-SIMETH 200-200-20 MG/5ML PO SUSP: 30.0000 mL | ORAL | Status: DC | PRN

## 2016-01-01 MED ORDER — SODIUM CHLORIDE 0.9 % IV SOLN
100.0000 mL/h | INTRAVENOUS | Status: DC
  Administered 2016-01-01 – 2016-01-02 (×2): 100 mL/h via INTRAVENOUS
  Filled 2016-01-01 (×3): qty 1000

## 2016-01-01 SURGICAL SUPPLY — 57 items
BAG DECANTER FOR FLEXI CONT (MISCELLANEOUS) ×3 IMPLANT
BAG ZIPLOCK 12X15 (MISCELLANEOUS) ×3 IMPLANT
BLADE SAW SGTL 18X1.27X75 (BLADE) ×2 IMPLANT
BLADE SAW SGTL 18X1.27X75MM (BLADE) ×1
BRUSH FEMORAL CANAL (MISCELLANEOUS) IMPLANT
DERMABOND ADVANCED (GAUZE/BANDAGES/DRESSINGS) ×2
DERMABOND ADVANCED .7 DNX12 (GAUZE/BANDAGES/DRESSINGS) ×1 IMPLANT
DRAPE INCISE IOBAN 85X60 (DRAPES) ×3 IMPLANT
DRAPE ORTHO SPLIT 77X108 STRL (DRAPES) ×4
DRAPE POUCH INSTRU U-SHP 10X18 (DRAPES) ×3 IMPLANT
DRAPE SURG 17X11 SM STRL (DRAPES) ×3 IMPLANT
DRAPE SURG ORHT 6 SPLT 77X108 (DRAPES) ×2 IMPLANT
DRAPE U-SHAPE 47X51 STRL (DRAPES) ×3 IMPLANT
DRESSING AQUACEL AG SP 3.5X10 (GAUZE/BANDAGES/DRESSINGS) IMPLANT
DRSG AQUACEL AG ADV 3.5X10 (GAUZE/BANDAGES/DRESSINGS) IMPLANT
DRSG AQUACEL AG ADV 3.5X14 (GAUZE/BANDAGES/DRESSINGS) ×3 IMPLANT
DRSG AQUACEL AG SP 3.5X10 (GAUZE/BANDAGES/DRESSINGS)
DURAPREP 26ML APPLICATOR (WOUND CARE) ×3 IMPLANT
ELECT BLADE TIP CTD 4 INCH (ELECTRODE) ×3 IMPLANT
ELECT REM PT RETURN 9FT ADLT (ELECTROSURGICAL) ×3
ELECTRODE REM PT RTRN 9FT ADLT (ELECTROSURGICAL) ×1 IMPLANT
FACESHIELD WRAPAROUND (MASK) ×12 IMPLANT
GLOVE BIOGEL M 7.0 STRL (GLOVE) IMPLANT
GLOVE BIOGEL PI IND STRL 7.5 (GLOVE) ×5 IMPLANT
GLOVE BIOGEL PI IND STRL 8.5 (GLOVE) ×1 IMPLANT
GLOVE BIOGEL PI INDICATOR 7.5 (GLOVE) ×10
GLOVE BIOGEL PI INDICATOR 8.5 (GLOVE) ×2
GLOVE ECLIPSE 8.0 STRL XLNG CF (GLOVE) ×6 IMPLANT
GOWN STRL REUS W/TWL LRG LVL3 (GOWN DISPOSABLE) ×6 IMPLANT
GOWN STRL REUS W/TWL XL LVL3 (GOWN DISPOSABLE) ×6 IMPLANT
HANDPIECE INTERPULSE COAX TIP (DISPOSABLE)
HEAD FEM BIOLOX DELTA 36 8.5 (Orthopedic Implant) ×3 IMPLANT
MANIFOLD NEPTUNE II (INSTRUMENTS) ×3 IMPLANT
MARKER SKIN DUAL TIP RULER LAB (MISCELLANEOUS) ×3 IMPLANT
NDL SAFETY ECLIPSE 18X1.5 (NEEDLE) IMPLANT
NEEDLE HYPO 18GX1.5 SHARP (NEEDLE)
NS IRRIG 1000ML POUR BTL (IV SOLUTION) ×3 IMPLANT
PINNACLE ALTRX PLUS 4 N 36X56 (Hips) ×3 IMPLANT
POSITIONER SURGICAL ARM (MISCELLANEOUS) ×3 IMPLANT
PRESSURIZER FEMORAL UNIV (MISCELLANEOUS) IMPLANT
SET HNDPC FAN SPRY TIP SCT (DISPOSABLE) IMPLANT
SPONGE LAP 18X18 X RAY DECT (DISPOSABLE) IMPLANT
SPONGE LAP 4X18 X RAY DECT (DISPOSABLE) IMPLANT
STAPLER VISISTAT 35W (STAPLE) IMPLANT
SUCTION FRAZIER HANDLE 10FR (MISCELLANEOUS) ×2
SUCTION TUBE FRAZIER 10FR DISP (MISCELLANEOUS) ×1 IMPLANT
SUT MNCRL AB 3-0 PS2 18 (SUTURE) ×3 IMPLANT
SUT VIC AB 1 CT1 36 (SUTURE) ×3 IMPLANT
SUT VIC AB 2-0 CT1 27 (SUTURE) ×6
SUT VIC AB 2-0 CT1 TAPERPNT 27 (SUTURE) ×3 IMPLANT
SUT VLOC 180 0 24IN GS25 (SUTURE) ×6 IMPLANT
TOWEL OR 17X26 10 PK STRL BLUE (TOWEL DISPOSABLE) ×6 IMPLANT
TOWER CARTRIDGE SMART MIX (DISPOSABLE) IMPLANT
TRAY FOLEY CATH 14FR (SET/KITS/TRAYS/PACK) ×3 IMPLANT
TUBE KAMVAC SUCTION (TUBING) IMPLANT
WATER STERILE IRR 1500ML POUR (IV SOLUTION) ×6 IMPLANT
YANKAUER SUCT BULB TIP 10FT TU (MISCELLANEOUS) ×3 IMPLANT

## 2016-01-01 NOTE — Interval H&P Note (Signed)
History and Physical Interval Note:  01/01/2016 12:09 PM  [name]  has presented today for surgery, with the diagnosis of RIGHT FAILED TOTAL HIP  The various methods of treatment have been discussed with the patient and family. After consideration of risks, benefits and other options for treatment, the patient has consented to  Procedure(s): TOTAL HIP REVISION (Right) as a surgical intervention .  The patient's history has been reviewed, patient examined, no change in status, stable for surgery.  I have reviewed the patient's chart and labs.  Questions were answered to the patient's satisfaction.     Mauri Pole

## 2016-01-01 NOTE — Anesthesia Procedure Notes (Signed)
Spinal  Patient location during procedure: OR Start time: 01/01/2016 1:15 PM End time: 01/01/2016 1:23 PM Staffing Anesthesiologist: Marcie Bal, ADAM Performed: anesthesiologist  Preanesthetic Checklist Completed: patient identified, site marked, surgical consent, pre-op evaluation, timeout performed, IV checked, risks and benefits discussed and monitors and equipment checked Spinal Block Patient position: sitting Prep: DuraPrep Patient monitoring: heart rate, cardiac monitor, continuous pulse ox and blood pressure Approach: midline Location: L3-4 Injection technique: single-shot Needle Needle type: Pencan  Needle gauge: 24 G Needle length: 9 cm Assessment Sensory level: T8 Additional Notes Pt tolerated the procedure well.

## 2016-01-01 NOTE — Anesthesia Preprocedure Evaluation (Signed)
Anesthesia Evaluation  Patient identified by MRN, date of birth, ID band Patient awake    Reviewed: Allergy & Precautions, H&P , NPO status , Patient's Chart, lab work & pertinent test results  Airway Mallampati: II   Neck ROM: full    Dental   Pulmonary COPD, Current Smoker,    breath sounds clear to auscultation       Cardiovascular hypertension,  Rhythm:regular Rate:Normal     Neuro/Psych  Headaches, PSYCHIATRIC DISORDERS Anxiety Depression    GI/Hepatic GERD  ,  Endo/Other  Hypothyroidism   Renal/GU      Musculoskeletal  (+) Arthritis ,   Abdominal   Peds  Hematology   Anesthesia Other Findings   Reproductive/Obstetrics                             Anesthesia Physical Anesthesia Plan  ASA: II  Anesthesia Plan: MAC and Spinal   Post-op Pain Management:    Induction: Intravenous  Airway Management Planned: Simple Face Mask  Additional Equipment:   Intra-op Plan:   Post-operative Plan:   Informed Consent: I have reviewed the patients History and Physical, chart, labs and discussed the procedure including the risks, benefits and alternatives for the proposed anesthesia with the patient or authorized representative who has indicated his/her understanding and acceptance.     Plan Discussed with: CRNA, Anesthesiologist and Surgeon  Anesthesia Plan Comments:         Anesthesia Quick Evaluation

## 2016-01-01 NOTE — Transfer of Care (Signed)
Immediate Anesthesia Transfer of Care Note  Patient: [name]  Procedure(s) Performed: Procedure(s): TOTAL HIP REVISION (Right)  Patient Location: PACU  Anesthesia Type:Spinal  Level of Consciousness: awake, alert  and oriented  Airway & Oxygen Therapy: Patient Spontanous Breathing and Patient connected to face mask oxygen  Post-op Assessment: Report given to RN and Post -op Vital signs reviewed and stable  Post vital signs: Reviewed and stable  Last Vitals:  Vitals:   01/01/16 1039  BP: 117/80  Pulse: 94  Resp: 16  Temp: 36.5 C    Last Pain:  Vitals:   01/01/16 1039  TempSrc: Oral  PainSc:       Patients Stated Pain Goal: 4 (65/03/54 6568)  Complications: No apparent anesthesia complications

## 2016-01-01 NOTE — Brief Op Note (Signed)
01/01/2016  2:30 PM  PATIENT:  [name]  60 y.o. 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    PROCEDURE:  Procedure(s): TOTAL HIP REVISION (Right)  56Y61+6 neutral Altrex liner, 36+8.5 TS Delta ceramic ball  SURGEON:  Surgeon(s) and Role:    * Paralee Cancel, MD - Primary  PHYSICIAN ASSISTANT: Danae Orleans, PA-C  ANESTHESIA:   spinal  EBL:  Total I/O In: 2000 [I.V.:2000] Out: 400 [Urine:100; Blood:300]  BLOOD ADMINISTERED:none  DRAINS: none   LOCAL MEDICATIONS USED:  NONE  SPECIMEN:  No Specimen  DISPOSITION OF SPECIMEN:  N/A  COUNTS:  YES  TOURNIQUET:  * No tourniquets in log *  DICTATION: .Other Dictation: Dictation Number [id number]  PLAN OF CARE: Admit to inpatient   PATIENT DISPOSITION:  PACU - hemodynamically stable.   Delay start of Pharmacological VTE agent (>24hrs) due to surgical blood loss or risk of bleeding: no

## 2016-01-02 LAB — CBC
HEMATOCRIT: 33.8 % — AB (ref 36.0–46.0)
Hemoglobin: 10.3 g/dL — ABNORMAL LOW (ref 12.0–15.0)
MCH: 26 pg (ref 26.0–34.0)
MCHC: 30.5 g/dL (ref 30.0–36.0)
MCV: 85.4 fL (ref 78.0–100.0)
PLATELETS: 183 10*3/uL (ref 150–400)
RBC: 3.96 MIL/uL (ref 3.87–5.11)
RDW: 16.2 % — AB (ref 11.5–15.5)
WBC: 11.4 10*3/uL — AB (ref 4.0–10.5)

## 2016-01-02 LAB — BASIC METABOLIC PANEL
ANION GAP: 6 (ref 5–15)
BUN: 14 mg/dL (ref 6–20)
CALCIUM: 8.1 mg/dL — AB (ref 8.9–10.3)
CO2: 27 mmol/L (ref 22–32)
CREATININE: 0.8 mg/dL (ref 0.44–1.00)
Chloride: 108 mmol/L (ref 101–111)
Glucose, Bld: 155 mg/dL — ABNORMAL HIGH (ref 65–99)
Potassium: 4.5 mmol/L (ref 3.5–5.1)
SODIUM: 141 mmol/L (ref 135–145)

## 2016-01-02 MED ORDER — HYDROCODONE-ACETAMINOPHEN 10-325 MG PO TABS: 1.0000 | ORAL_TABLET | ORAL | 0 refills | Status: DC | PRN

## 2016-01-02 MED ORDER — ASPIRIN 81 MG PO CHEW: 81.0000 mg | CHEWABLE_TABLET | Freq: Two times a day (BID) | ORAL | 0 refills | Status: DC

## 2016-01-02 MED ORDER — FERROUS SULFATE 325 (65 FE) MG PO TABS: 325.0000 mg | ORAL_TABLET | Freq: Three times a day (TID) | ORAL | 3 refills | Status: DC

## 2016-01-02 MED ORDER — METHOCARBAMOL 500 MG PO TABS: 500.0000 mg | ORAL_TABLET | Freq: Four times a day (QID) | ORAL | 0 refills | Status: DC | PRN

## 2016-01-02 MED ORDER — DOCUSATE SODIUM 100 MG PO CAPS: 100.0000 mg | ORAL_CAPSULE | Freq: Two times a day (BID) | ORAL | 0 refills | Status: DC

## 2016-01-02 MED ORDER — POLYETHYLENE GLYCOL 3350 17 G PO PACK: 17.0000 g | PACK | Freq: Two times a day (BID) | ORAL | 0 refills | Status: DC

## 2016-01-02 NOTE — Care Management Note (Signed)
Case Management Note  Patient Details  Name: [name] MRN: [id number] Date of Birth: May 27, 1955  Subjective/Objective:                  TOTAL HIP REVISION (Right) Action/Plan: Discharge planning Expected Discharge Date: 01/02/16              Expected Discharge Plan:  Home/Self Care  In-House Referral:  NA  Discharge planning Services  CM Consult  Post Acute Care Choice:  NA Choice offered to:  Patient  DME Arranged:  3-N-1, Walker rolling DME Agency:  [place]:  NA [place] Agency:  NA  Status of Service:  Completed, signed off  If discussed at [place] of Stay Meetings, dates discussed:    Additional Comments: CM met with pt in room to confirm NO home health services is the plan; pt confirms as does documentation by MD.  Cm notified [facility] rep, Larene Beach to please deliver the 3n1 and rolling walker to room prior to discharge.  NO other CM needs were communicated. Dellie Catholic, RN 01/02/2016, 1:03 PM

## 2016-01-02 NOTE — Evaluation (Signed)
Occupational Therapy Evaluation Patient Details Name: [name] MRN: [id number] DOB: 05-Dec-1955 Today's Date: 01/02/2016    History of Present Illness revision posterior THA on the right   Clinical Impression   OT education complete regarding ADL activity and THP.  Encouraged pt to obtain a reacher in order to follow hip precautions    Follow Up Recommendations  No OT follow up    Equipment Recommendations  3 in 1 bedside comode    Recommendations for Other Services       Precautions / Restrictions Precautions Precautions: Posterior Hip      Mobility Bed Mobility Overal bed mobility: Needs Assistance Bed Mobility: Supine to Sit     Supine to sit: Supervision     General bed mobility comments: pt OOB  Transfers Overall transfer level: Needs assistance Equipment used: Rolling walker (2 wheeled) Transfers: Stand Pivot Transfers;Sit to/from Stand Sit to Stand: Supervision Stand pivot transfers: Supervision       General transfer comment: cues for safety and hand palcement and right leg position         ADL Overall ADL's : Needs assistance/impaired                                       General ADL Comments: Pt states husband will A with ADL activity . Offered education regarding ADL activity but pt declined stating husband will A. OT encouraged pt to get a reacher for items she may drop when husband is not there.  Extensive hip precaution ed provided in regards to ADL activity                Pertinent Vitals/Pain Pain Assessment: 0-10 Pain Score: 5  Pain Location: R hip and L knee Pain Descriptors / Indicators: Aching Pain Intervention(s): Monitored during session;Repositioned;Limited activity within patient's tolerance     Hand Dominance     Extremity/Trunk Assessment Upper Extremity Assessment Upper Extremity Assessment: Overall WFL for tasks assessed   Lower Extremity Assessment Lower Extremity Assessment: RLE  deficits/detail RLE Deficits / Details: advancesthe right leg.   Cervical / Trunk Assessment Cervical / Trunk Assessment: Normal   Communication Communication Communication: No difficulties   Cognition Arousal/Alertness: Awake/alert Behavior During Therapy: WFL for tasks assessed/performed Overall Cognitive Status: Within Functional Limits for tasks assessed                       Home Living Family/patient expects to be discharged to:: Private residence Living Arrangements: Spouse/significant other Available Help at Discharge: Family Type of Home: House Home Access: Stairs to enter Technical brewer of Steps: 5 Entrance Stairs-Rails: Left;Right Home Layout: One level     Bathroom Shower/Tub: Tub/shower unit;Walk-in shower         Home Equipment: Walker - standard;Cane - single point          Prior Functioning/Environment Level of Independence: Independent                       OT Goals(Current goals can be found in the care plan section) Acute Rehab OT Goals Patient Stated Goal: home today OT Goal Formulation: With patient  OT Frequency:     Barriers to D/C:               End of Session Nurse Communication: Mobility status  Activity Tolerance: Patient tolerated treatment well Patient left: in  chair;with call bell/phone within reach   Time: 1031-1039 OT Time Calculation (min): 8 min Charges:  OT General Charges $OT Visit: 1 Procedure OT Evaluation $OT Eval Low Complexity: 1 Procedure G-Codes:    Betsy Pries 01/28/16, 10:55 AM

## 2016-01-02 NOTE — Op Note (Signed)
[name], [name] NO.:  [id number]  MEDICAL RECORD NO.:  [id number]  LOCATION:                                 FACILITY:  PHYSICIAN:  Pietro Cassis. Alvan Dame, M.D.  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  PROCEDURE:  Revision right total hip arthroplasty with retention of the acetabular shell and femoral stem revising the acetabular liner and head ball.  COMPONENTS USED:  Size 56 x 36 +4 neutral AltrX liner, the 36+ 8.5 delta ceramic ball with a titanium sleeve insert.  SURGEON:  Pietro Cassis. Alvan Dame, M.D.  ASSISTANT:  Danae Orleans, PA-C.  ANESTHESIA:  Spinal.  SPECIMENS:  None.  COMPLICATIONS:  None.  FINDINGS:  The patient was noted to have metal-stained synovial lining with a large joint effusion, but no evidence of any muscle damage or bone necrosis.  INDICATIONS FOR PROCEDURE:  [name] is a 60 year old female with a history of right hip revision surgery in 2010.  She had presented to office for recent evaluation of her knees, but reported audible sound in her hips with motion.  Given the radiographic concerns for metal-on-metal hip, we worked her up and she had elevated serum cobalt and chromium levels as well as had an MRI consistent with pseudotumor and soft tissue swelling.  Given these findings, the indication for revision surgery was present. We discussed the risk of infection, DVT.  We discussed the risk of dislocation in the setting of acetabular revision surgery.  Consent was obtained for benefit of pain relief and removal of metal issues.  PROCEDURE IN DETAIL:  The patient was brought to the operative theater. Once adequate anesthesia, preoperative antibiotics, Ancef  administered as well as tranexamic acid and Decadron, she was positioned into the left lateral decubitus position with the right side up.  The right lower extremity was then prepped and draped in sterile fashion.  Time-out was performed identifying the patient, planned procedure, and extremity.  Her old incision was identified and incised.  Soft tissue planes created at the iliotibial band and gluteal fascia, which were then incised for posterior approach to the hip.  Once we entered into this subfascial layer, we identified metal-stained synovial lining.  The posterior aspect of the hip was exposed excising this synovium.  Once the posterior two-thirds of the hip was exposed, the hip was dislocated removing a ceramic ball with the titanium sleeve liner.  I kept the sleeve on the hip and then placed the trunnion onto the ilium and exposed the acetabular shell.  I elected at this point not to revise the acetabulum based on her exposed acetabular shell and concern for remaining bone stock.  Utilizing the device to remove the metal liner, the metal liner was removed without difficulty.  Then removed the hole eliminator as well as 2 screws into the ilium and then irrigated and debrided the canal.  At this point, I spent time debriding stained synovial tissue in the  anterior aspect of the joint as well as tracking down to the iliopsoas bursa region.  Once the acetabulum was fully exposed and adequate debridement, I did a trial reduction with 36+ 4 neutral liner and a 36+ 8.5 ball.  Combined anteversion was about 50 degrees.  There was no evidence of any impingement with extension, external rotation, or with forward flexion or internal rotation.  Leg lengths appeared to be comparable to the down leg as compared to the preoperative positioning.  Given this, the hip was dislocated.  The neck was carefully placed anteriorly again with retractors placed.  The acetabular trial liner  was removed.  At this point, the final 36+ 4 neutral AltrX liner was opened and impacted with good visualized rim fit circumferentially.  The final 36+ 5 revision ceramic ball with a titanium sleeve built-in was then impacted on the clean and dry trunnion and the hip was reduced. We irrigated the hip throughout the case again at this point.  Further debridement was carried out as necessary.  Due to the debridement, there was no significant capsular tissue in the posterior aspect of the hip to be reapproximated.  Thus, I reapproximated the iliotibial band and gluteal fascia using a combination of #1 Vicryl and 0 V-Loc suture.  The remainder of the wound at this point was closed with 2-0 Vicryl and running 3-0 Monocryl.  The hip was then cleaned, dried, and dressed sterilely using surgical glue and Aquacel dressing.  She was brought to the recovery room in stable condition tolerating the procedure well. Findings were reviewed with family.  Postoperatively, she will be weightbearing as tolerated, to be mindful of posterior hip precautions until her limp improves.  I would expect resolution of her symptoms with this procedure.     Pietro Cassis Alvan Dame, M.D.     MDO/MEDQ  D:  01/01/2016  T:  01/02/2016  Job:  [id number]

## 2016-01-02 NOTE — Progress Notes (Signed)
Patient ID: [name], female   DOB: [birth year], 60 y.o.   MRN: [id number] Subjective: 1 Day Post-Op Procedure(s) (LRB): TOTAL HIP REVISION (Right)    Patient reports pain as mild.  Doing well, no events.  Having left knee concerns  Objective:   VITALS:   Vitals:   01/02/16 0155 01/02/16 0519  BP: (!) 96/58 108/61  Pulse:    Resp: 16 16  Temp: 98.8 F (37.1 C) 97.6 F (36.4 C)    Neurovascular intact Incision: dressing C/D/I - right hip  LABS  Recent Labs  01/02/16 0524  HGB 10.3*  HCT 33.8*  WBC 11.4*  PLT 183     Recent Labs  01/02/16 0524  NA 141  K 4.5  BUN 14  CREATININE 0.80  GLUCOSE 155*    No results for input(s): LABPT, INR in the last 72 hours.   Assessment/Plan: 1 Day Post-Op Procedure(s) (LRB): TOTAL HIP REVISION (Right)   Advance diet Up with therapy Discharge home this am RTC in 2 weeks No PT necessary Posterior hip precautions, WBAT

## 2016-01-02 NOTE — Anesthesia Postprocedure Evaluation (Signed)
Anesthesia Post Note  Patient: [name]  Procedure(s) Performed: Procedure(s) (LRB): TOTAL HIP REVISION (Right)  Patient location during evaluation: PACU Anesthesia Type: Spinal Level of consciousness: oriented and awake and alert Pain management: pain level controlled Vital Signs Assessment: post-procedure vital signs reviewed and stable Respiratory status: spontaneous breathing, respiratory function stable and patient connected to nasal cannula oxygen Cardiovascular status: blood pressure returned to baseline and stable Postop Assessment: no headache and no backache Anesthetic complications: no    Last Vitals:  Vitals:   01/02/16 0155 01/02/16 0519  BP: (!) 96/58 108/61  Pulse:    Resp: 16 16  Temp: 37.1 C 36.4 C    Last Pain:  Vitals:   01/02/16 0519  TempSrc: Oral  PainSc: [place]

## 2016-01-02 NOTE — Discharge Instructions (Signed)

## 2016-01-02 NOTE — Progress Notes (Signed)
Physical Therapy Treatment Patient Details Name: MAKYAH LAVIGNE MRN: [id number] DOB: October 16, 1955 Today's Date: 01/02/2016    History of Present Illness revision posterior THA on the right    PT Comments    Cues for posterior precautions   Follow Up Recommendations  No PT follow up     Equipment Recommendations  Rolling walker with 5" wheels;3in1 (PT)    Recommendations for Other Services       Precautions / Restrictions Precautions Precautions: Posterior Hip    Mobility  Bed Mobility Overal bed mobility: Modified Independent Bed Mobility: Supine to Sit;Sit to Supine           General bed mobility comments: cues  to not cross the legs.  Transfers   Equipment used: Rolling walker (2 wheeled) Transfers: Sit to/from Stand Sit to Stand: Supervision            Ambulation/Gait Ambulation/Gait assistance: Supervision Ambulation Distance (Feet): 200 Feet Assistive device: Rolling walker (2 wheeled) Gait Pattern/deviations: Step-through pattern     General Gait Details: cues for sequence.   Stairs Stairs: Yes Stairs assistance: Supervision Stair Management: One rail Left;Forwards;With cane Number of Stairs: 2 General stair comments: cues for sequence  Wheelchair Mobility    Modified Rankin (Stroke Patients Only)       Balance                                    Cognition Arousal/Alertness: Awake/alert                          Exercises Total Joint Exercises Short Arc Quad: AROM;Right;10 reps Heel Slides: AROM;Right;10 reps Hip ABduction/ADduction: AROM;Right;10 reps    General Comments        Pertinent Vitals/Pain Pain Score: 2  Pain Location: right hip Pain Descriptors / Indicators: Sore Pain Intervention(s): Monitored during session;Premedicated before session    Home Living                      Prior Function            PT Goals (current goals can now be found in the care plan section)  Acute Rehab PT Goals Patient Stated Goal: home today PT Goal Formulation: With patient/family Time For Goal Achievement: 01/02/16 Potential to Achieve Goals: Good Progress towards PT goals: Progressing toward goals    Frequency    7X/week      PT Plan Current plan remains appropriate    Co-evaluation             End of Session   Activity Tolerance: Patient tolerated treatment well Patient left: in bed;with call bell/phone within reach;with family/visitor present     Time: 1040-1108 PT Time Calculation (min) (ACUTE ONLY): 28 min  Charges:  $Gait Training: 8-22 mins $Therapeutic Exercise: 8-22 mins                    G Codes:      Claretha Cooper 01/02/2016, 4:44 PM

## 2016-01-02 NOTE — Evaluation (Signed)
Physical Therapy Evaluation Patient Details Name: [name] MRN: [id number] DOB: 05/15/1955 Today's Date: 01/02/2016   History of Present Illness  revision posterior THA on the right  Clinical Impression  The patient requires verbal cues for posterior hip precautions. Pt admitted with above diagnosis. Pt currently with functional limitations due to the deficits listed below (see PT Problem List).  Pt will benefit from skilled PT to increase their independence and safety with mobility to allow discharge to the venue listed below.       Follow Up Recommendations No PT follow up    Equipment Recommendations  Rolling walker with 5" wheels;3in1 (PT)    Recommendations for Other Services       Precautions / Restrictions Precautions Precautions: Posterior Hip      Mobility  Bed Mobility Overal bed mobility: Needs Assistance Bed Mobility: Supine to Sit     Supine to sit: Supervision     General bed mobility comments: pt OOB  Transfers Overall transfer level: Needs assistance Equipment used: Rolling walker (2 wheeled) Transfers: Stand Pivot Transfers;Sit to/from Stand Sit to Stand: Supervision Stand pivot transfers: Supervision       General transfer comment: cues for safety and hand palcement and right leg position  Ambulation/Gait Ambulation/Gait assistance: Supervision Ambulation Distance (Feet): 30 Feet Assistive device: Rolling walker (2 wheeled) Gait Pattern/deviations: Step-through pattern     General Gait Details: cues for sequence.  Stairs            Wheelchair Mobility    Modified Rankin (Stroke Patients Only)       Balance                                             Pertinent Vitals/Pain Pain Assessment: 0-10 Pain Score: 5  Pain Location: R hip and L knee Pain Descriptors / Indicators: Aching Pain Intervention(s): Monitored during session;Repositioned;Limited activity within patient's tolerance    Home Living  Family/patient expects to be discharged to:: Private residence Living Arrangements: Spouse/significant other Available Help at Discharge: Family Type of Home: House Home Access: Stairs to enter Entrance Stairs-Rails: Chemical engineer of Steps: 5 Home Layout: One level Home Equipment: Environmental consultant - standard;Cane - single point      Prior Function Level of Independence: Independent               Hand Dominance        Extremity/Trunk Assessment   Upper Extremity Assessment: Overall WFL for tasks assessed           Lower Extremity Assessment: RLE deficits/detail RLE Deficits / Details: advancesthe right leg.    Cervical / Trunk Assessment: Normal  Communication   Communication: No difficulties  Cognition Arousal/Alertness: Awake/alert Behavior During Therapy: WFL for tasks assessed/performed Overall Cognitive Status: Within Functional Limits for tasks assessed                      General Comments      Exercises     Assessment/Plan    PT Assessment Patient needs continued PT services  PT Problem List Decreased strength;Decreased activity tolerance          PT Treatment Interventions DME instruction;Gait training;Stair training;Functional mobility training;Therapeutic activities    PT Goals (Current goals can be found in the Care Plan section)  Acute Rehab PT Goals Patient Stated Goal: home today PT Goal  Formulation: With patient/family Time For Goal Achievement: 01/02/16 Potential to Achieve Goals: Good    Frequency 7X/week   Barriers to discharge        Co-evaluation               End of Session   Activity Tolerance: Patient tolerated treatment well Patient left:  (up with OT) Nurse Communication: Mobility status         Time: 7615-1834 PT Time Calculation (min) (ACUTE ONLY): 9 min   Charges:   PT Evaluation $PT Eval Low Complexity: 1 Procedure     PT G CodesClaretha Cooper 01/02/2016, 2:17 PM

## 2016-01-03 NOTE — Discharge Summary (Signed)
Physician Discharge Summary  Patient ID: [name] MRN: [id number] DOB/AGE: [birth year] 60 y.o.  Admit date: 01/01/2016 Discharge date: 01/02/2016   Procedures:  Procedure(s) (LRB): TOTAL HIP REVISION (Right)  Attending Physician:  Dr. Paralee Cancel   Admission Diagnoses:   Right hip pain  S/P THA  Discharge Diagnoses:  Active Problems:   S/P revision of total hip  Past Medical History:  Diagnosis Date  . Allergic rhinitis   . Anemia   . Anxiety   . Chronic back pain   . COPD (chronic obstructive pulmonary disease) ([place])   . Depression   . Essential hypertension   . GERD (gastroesophageal reflux disease)   . Headache    migraines  . History of gastritis    EGD 2015  . History of home oxygen therapy    2 liters at hs last 6 months  . Hypothyroidism   . Osteoarthritis    oa  . Scoliosis     HPI:    [name], 60 y.o. female, has a history of pain and functional disability in the right hip due to failed previous THA and patient has failed non-surgical conservative treatments for greater than 12 weeks to include NSAID's and/or analgesics, use of assistive devices and activity modification. The indications for the revision total hip arthroplasty are bearing surface wear leading to  symptomatic synovitis, implant or hip misalignment and hip instability. Onset of symptoms was gradual starting 16+ years ago with gradually worsening course since that time.  Prior procedures on the right hip include arthroplasty. Patient currently rates pain in the right hip at 7 out of 10 with activity.  There is night pain, worsening of pain with activity and weight bearing, trendelenberg gait, pain that interfers with activities of daily living and pain with passive range of motion. Patient has evidence of metal-on-metal prosthesis by imaging studies.  This condition presents safety issues increasing the risk of falls.   There is no current active infection.   Risks, benefits and  expectations were discussed with the patient.  Risks including but not limited to the risk of anesthesia, blood clots, nerve damage, blood vessel damage, failure of the prosthesis, infection and up to and including death.  Patient understand the risks, benefits and expectations and wishes to proceed with surgery.   PCP: Deloria Lair, MD   Discharged Condition: good  Hospital Course:  Patient underwent the above stated procedure on 01/01/2016. Patient tolerated the procedure well and brought to the recovery room in good condition and subsequently to the floor.  POD #1 BP: 108/61 ; Temp: 97.6 F (36.4 C) ; Resp: 16 Patient reports pain as mild.  Doing well, no events.  Having left knee concerns. Neurovascular intact and incision: dressing C/D/I - right hip.  LABS  Basename    HGB     10.3  HCT     33.8    Discharge Exam: General appearance: alert, cooperative and no distress Extremities: Homans sign is negative, no sign of DVT, no edema, redness or tenderness in the calves or thighs and no ulcers, gangrene or trophic changes  Disposition: Home with follow up in 2 weeks   Follow-up Information    Mauri Pole, MD. Schedule an appointment as soon as possible for a visit in 2 week(s).   Specialty:  Orthopedic Surgery Contact information: [address] [place] [address] [phone]        [facility] - [facility] Follow up.  Why:  rolling walker and 3n1 Contact information: [address] [place] [address] [phone]           Discharge Instructions    Call MD / Call 911    Complete by:  As directed    If you experience chest pain or shortness of breath, CALL 911 and be transported to the hospital emergency room.  If you develope a fever above 101 F, pus (white drainage) or increased drainage or redness at the wound, or calf pain, call your surgeon's office.   Change dressing    Complete by:  As directed    Maintain surgical  dressing until follow up in the clinic. If the edges start to pull up, may reinforce with tape. If the dressing is no longer working, may remove and cover with gauze and tape, but must keep the area dry and clean.  Call with any questions or concerns.   Constipation Prevention    Complete by:  As directed    Drink plenty of fluids.  Prune juice may be helpful.  You may use a stool softener, such as Colace (over the counter) 100 mg twice a day.  Use MiraLax (over the counter) for constipation as needed.   Diet - low sodium heart healthy    Complete by:  As directed    Discharge instructions    Complete by:  As directed    Maintain surgical dressing until follow up in the clinic. If the edges start to pull up, may reinforce with tape. If the dressing is no longer working, may remove and cover with gauze and tape, but must keep the area dry and clean.  Follow up in 2 weeks at [facility]. Call with any questions or concerns.   Increase activity slowly as tolerated    Complete by:  As directed    Weight bearing as tolerated with assist device (walker, cane, etc) as directed, use it as long as suggested by your surgeon or therapist, typically at least 4-6 weeks.   TED hose    Complete by:  As directed    Use stockings (TED hose) for 2 weeks on both leg(s).  You may remove them at night for sleeping.        Medication List    STOP taking these medications   GOODY HEADACHE PO   ibuprofen 200 MG tablet Commonly known as:  ADVIL,MOTRIN   meloxicam 15 MG tablet Commonly known as:  MOBIC     TAKE these medications   ALPRAZolam 1 MG tablet Commonly known as:  XANAX Take 1 mg by mouth 4 (four) times daily.   aspirin 81 MG chewable tablet Chew 1 tablet (81 mg total) by mouth 2 (two) times daily. Take for 4 weeks.   atorvastatin 20 MG tablet Commonly known as:  LIPITOR Take 20 mg by mouth at bedtime.   docusate sodium 100 MG capsule Commonly known as:  COLACE Take 1 capsule  (100 mg total) by mouth 2 (two) times daily.   ferrous sulfate 325 (65 FE) MG tablet Take 1 tablet (325 mg total) by mouth 3 (three) times daily after meals.   fluticasone 50 MCG/ACT nasal spray Commonly known as:  FLONASE Place 2 sprays into both nostrils daily.   HYDROcodone-acetaminophen 10-325 MG tablet Commonly known as:  NORCO Take 1-2 tablets by mouth every 4 (four) hours as needed. What changed:  how much to take  when to take this  reasons to take this  levothyroxine 75 MCG tablet Commonly known as:  SYNTHROID, LEVOTHROID Take 75 mcg by mouth daily before breakfast.   loratadine 10 MG tablet Commonly known as:  CLARITIN Take 10 mg by mouth daily.   methocarbamol 500 MG tablet Commonly known as:  ROBAXIN Take 1 tablet (500 mg total) by mouth every 6 (six) hours as needed for muscle spasms.   nitroGLYCERIN 0.4 MG SL tablet Commonly known as:  NITROSTAT Place 1 tablet (0.4 mg total) under the tongue every 5 (five) minutes as needed for chest pain.   omeprazole 20 MG capsule Commonly known as:  PRILOSEC Take 20 mg by mouth daily.   OXYGEN Inhale into the lungs. 2 liters at hs   polyethylene glycol packet Commonly known as:  MIRALAX / GLYCOLAX Take 17 g by mouth 2 (two) times daily.   potassium chloride 10 MEQ tablet Commonly known as:  K-DUR Take 10 mEq by mouth at bedtime.   ranitidine 150 MG capsule Commonly known as:  ZANTAC Take 300 mg by mouth at bedtime.   sertraline 100 MG tablet Commonly known as:  ZOLOFT Take 150 mg by mouth at bedtime.   triamterene-hydrochlorothiazide 37.5-25 MG capsule Commonly known as:  DYAZIDE Take 1 capsule by mouth every morning.        Signed: [place] Pugh. Luz Mares   PA-C  01/03/2016, 9:40 AM

## 2016-01-08 ENCOUNTER — Emergency Department (HOSPITAL_COMMUNITY): Payer: BLUE CROSS/BLUE SHIELD

## 2016-01-08 ENCOUNTER — Emergency Department (HOSPITAL_COMMUNITY): Payer: BLUE CROSS/BLUE SHIELD | Admitting: Anesthesiology

## 2016-01-08 ENCOUNTER — Emergency Department (HOSPITAL_COMMUNITY)
Admission: EM | Admit: 2016-01-08 | Discharge: 2016-01-08 | Disposition: A | Discharge status: Home / Self Care | Payer: BLUE CROSS/BLUE SHIELD | Attending: Emergency Medicine | Admitting: Emergency Medicine

## 2016-01-08 ENCOUNTER — Encounter (HOSPITAL_COMMUNITY)
Admission: EM | Disposition: A | Discharge status: Home / Self Care | Payer: Self-pay | Source: Home / Self Care | Attending: Emergency Medicine

## 2016-01-08 ENCOUNTER — Encounter (HOSPITAL_COMMUNITY): Payer: Self-pay | Admitting: Nurse Practitioner

## 2016-01-08 DIAGNOSIS — I10 Essential (primary) hypertension: Secondary | ICD-10-CM | POA: Insufficient documentation

## 2016-01-08 DIAGNOSIS — K219 Gastro-esophageal reflux disease without esophagitis: Secondary | ICD-10-CM | POA: Insufficient documentation

## 2016-01-08 DIAGNOSIS — J449 Chronic obstructive pulmonary disease, unspecified: Secondary | ICD-10-CM | POA: Diagnosis not present

## 2016-01-08 DIAGNOSIS — S73004A Unspecified dislocation of right hip, initial encounter: Secondary | ICD-10-CM

## 2016-01-08 DIAGNOSIS — Y831 Surgical operation with implant of artificial internal device as the cause of abnormal reaction of the patient, or of later complication, without mention of misadventure at the time of the procedure: Secondary | ICD-10-CM | POA: Insufficient documentation

## 2016-01-08 DIAGNOSIS — Z9981 Dependence on supplemental oxygen: Secondary | ICD-10-CM | POA: Diagnosis not present

## 2016-01-08 DIAGNOSIS — F329 Major depressive disorder, single episode, unspecified: Secondary | ICD-10-CM | POA: Insufficient documentation

## 2016-01-08 DIAGNOSIS — Z79899 Other long term (current) drug therapy: Secondary | ICD-10-CM | POA: Diagnosis not present

## 2016-01-08 DIAGNOSIS — Z7951 Long term (current) use of inhaled steroids: Secondary | ICD-10-CM | POA: Insufficient documentation

## 2016-01-08 DIAGNOSIS — J309 Allergic rhinitis, unspecified: Secondary | ICD-10-CM | POA: Diagnosis not present

## 2016-01-08 DIAGNOSIS — T84020A Dislocation of internal right hip prosthesis, initial encounter: Secondary | ICD-10-CM | POA: Insufficient documentation

## 2016-01-08 DIAGNOSIS — D649 Anemia, unspecified: Secondary | ICD-10-CM | POA: Insufficient documentation

## 2016-01-08 DIAGNOSIS — F419 Anxiety disorder, unspecified: Secondary | ICD-10-CM | POA: Diagnosis not present

## 2016-01-08 DIAGNOSIS — E039 Hypothyroidism, unspecified: Secondary | ICD-10-CM | POA: Insufficient documentation

## 2016-01-08 DIAGNOSIS — F17210 Nicotine dependence, cigarettes, uncomplicated: Secondary | ICD-10-CM | POA: Diagnosis not present

## 2016-01-08 DIAGNOSIS — Z419 Encounter for procedure for purposes other than remedying health state, unspecified: Secondary | ICD-10-CM

## 2016-01-08 HISTORY — PX: HIP CLOSED REDUCTION: SHX983

## 2016-01-08 SURGERY — CLOSED MANIPULATION, JOINT, HIP
Anesthesia: General | Site: Hip | Laterality: Right

## 2016-01-08 MED ORDER — HYDROMORPHONE HCL 1 MG/ML IJ SOLN
0.2500 mg | INTRAMUSCULAR | Status: DC | PRN
Start: 2016-01-08 — End: 2016-01-09
  Administered 2016-01-08: 0.5 mg via INTRAVENOUS

## 2016-01-08 MED ORDER — FENTANYL CITRATE (PF) 100 MCG/2ML IJ SOLN
100.0000 ug | Freq: Once | INTRAMUSCULAR | Status: AC
  Administered 2016-01-08: 100 ug via INTRAVENOUS
  Filled 2016-01-08: qty 2

## 2016-01-08 MED ORDER — PROPOFOL 10 MG/ML IV BOLUS
INTRAVENOUS | Status: AC
  Filled 2016-01-08: qty 20

## 2016-01-08 MED ORDER — LIDOCAINE 2% (20 MG/ML) 5 ML SYRINGE
INTRAMUSCULAR | Status: DC | PRN
  Administered 2016-01-08: 50 mg via INTRAVENOUS

## 2016-01-08 MED ORDER — FENTANYL CITRATE (PF) 100 MCG/2ML IJ SOLN
INTRAMUSCULAR | Status: DC | PRN
  Administered 2016-01-08 (×2): 50 ug via INTRAVENOUS

## 2016-01-08 MED ORDER — SODIUM CHLORIDE 0.9 % IV BOLUS (SEPSIS)
1000.0000 mL | Freq: Once | INTRAVENOUS | Status: AC
  Administered 2016-01-08: 1000 mL via INTRAVENOUS

## 2016-01-08 MED ORDER — LACTATED RINGERS IV SOLN
INTRAVENOUS | Status: DC | PRN
  Administered 2016-01-08: 21:00:00 via INTRAVENOUS

## 2016-01-08 MED ORDER — FENTANYL CITRATE (PF) 100 MCG/2ML IJ SOLN
INTRAMUSCULAR | Status: AC
  Filled 2016-01-08: qty 2

## 2016-01-08 MED ORDER — PROPOFOL 10 MG/ML IV BOLUS
1.0000 mg/kg | Freq: Once | INTRAVENOUS | Status: AC
  Administered 2016-01-08: 50 mg via INTRAVENOUS
  Filled 2016-01-08: qty 20

## 2016-01-08 MED ORDER — HYDROMORPHONE HCL 2 MG/ML IJ SOLN
INTRAMUSCULAR | Status: AC
  Filled 2016-01-08: qty 1

## 2016-01-08 MED ORDER — SUCCINYLCHOLINE CHLORIDE 200 MG/10ML IV SOSY
PREFILLED_SYRINGE | INTRAVENOUS | Status: DC | PRN
  Administered 2016-01-08: 180 mg via INTRAVENOUS

## 2016-01-08 MED ORDER — PROPOFOL 10 MG/ML IV BOLUS
INTRAVENOUS | Status: DC | PRN
  Administered 2016-01-08: 150 mg via INTRAVENOUS

## 2016-01-08 SURGICAL SUPPLY — 2 items
IMMOBILIZER KNEE 20 (SOFTGOODS) ×3
IMMOBILIZER KNEE 20 THIGH 36 (SOFTGOODS) ×1 IMPLANT

## 2016-01-08 NOTE — ED Provider Notes (Signed)
[facility] DEPT Provider Note   CSN: [id number] Arrival date & time: 01/08/16  1505     History   Chief Complaint No chief complaint on file.   HPI [name] is a 60 y.o. female with pertinent pmh of R THA in 2006, revision of THA in 2010 and second revision of THA by Dr. Alvan Dame on 01/02/16 who presents complaining of R hip pain PTA.  Pt states she was in the shower, she tried to lift her R foot to wash it and felt her hip pop out.  Pt states she had been recovering well after THA revision last week - able to ambulate, sit/stand this past week since discharge from THA revision last week with tolerable discomfort. Pt denies numbness, tingling of R lower extremity.    HPI  Past Medical History:  Diagnosis Date  . Allergic rhinitis   . Anemia   . Anxiety   . Chronic back pain   . COPD (chronic obstructive pulmonary disease) ([place])   . Depression   . Essential hypertension   . GERD (gastroesophageal reflux disease)   . Headache    migraines  . History of gastritis    EGD 2015  . History of home oxygen therapy    2 liters at hs last 6 months  . Hypothyroidism   . Osteoarthritis    oa  . Scoliosis     Patient Active Problem List   Diagnosis Date Noted  . S/P revision of total hip 01/01/2016  . Abnormal nuclear stress test: Intermediate Risk 10/31/2015  . Atypical angina ([place]) 10/31/2015  . Hip bursitis 08/20/2012  . Trigger finger, acquired 08/20/2012  . TOBACCO USER 12/12/2008  . DYSLIPIDEMIA 11/17/2008  . ANXIETY DISORDER 11/16/2008  . HYPERTENSION, BENIGN 11/16/2008  . GEN OSTEOARTHROSIS INVOLVING MULTIPLE SITES 11/16/2008  . CHEST DISCOMFORT, ATYPICAL 11/16/2008    Past Surgical History:  Procedure Laterality Date  . APPENDECTOMY     1985  . CARDIAC CATHETERIZATION N/A 10/31/2015   Procedure: Left Heart Cath and Coronary Angiography;  Surgeon: Leonie Man, MD;  Location: [facility];  Service: Cardiovascular;  Laterality: N/A;  . CARPAL  TUNNEL RELEASE Left   . CARPAL TUNNEL RELEASE Right   . CHOLECYSTECTOMY  late 1980's  . TONSILLECTOMY    . TOTAL ABDOMINAL HYSTERECTOMY     1985, with 1 ovary removed and 2 nd ovary removed 2003  . TOTAL HIP ARTHROPLASTY Right    Original surgery 2006 with revision 2010  . TOTAL HIP REVISION Right 01/01/2016   Procedure: TOTAL HIP REVISION;  Surgeon: Paralee Cancel, MD;  Location: [facility];  Service: Orthopedics;  Laterality: Right;  . ULNAR NERVE TRANSPOSITION Right     OB History    No data available       Home Medications    Prior to Admission medications   Medication Sig Start Date End Date Taking? Authorizing Provider  ALPRAZolam Duanne Moron) 1 MG tablet Take 1 mg by mouth 4 (four) times daily.   Yes Historical Provider, MD  aspirin 81 MG chewable tablet Chew 1 tablet (81 mg total) by mouth 2 (two) times daily. Take for 4 weeks. 01/02/16  Yes Matthew Babish, PA-C  atorvastatin (LIPITOR) 20 MG tablet Take 20 mg by mouth at bedtime.    Yes Historical Provider, MD  fluticasone (FLONASE) 50 MCG/ACT nasal spray Place 2 sprays into both nostrils daily.   Yes Historical Provider, MD  HYDROcodone-acetaminophen (NORCO) 10-325 MG tablet Take 1-2 tablets by  mouth every 4 (four) hours as needed. Patient taking differently: Take 1-2 tablets by mouth every 4 (four) hours as needed for moderate pain or severe pain.  01/02/16  Yes Danae Orleans, PA-C  levothyroxine (SYNTHROID, LEVOTHROID) 75 MCG tablet Take 75 mcg by mouth daily before breakfast.   Yes Historical Provider, MD  loratadine (CLARITIN) 10 MG tablet Take 10 mg by mouth daily.   Yes Historical Provider, MD  methocarbamol (ROBAXIN) 500 MG tablet Take 1 tablet (500 mg total) by mouth every 6 (six) hours as needed for muscle spasms. 01/02/16  Yes Danae Orleans, PA-C  omeprazole (PRILOSEC) 20 MG capsule Take 20 mg by mouth daily.   Yes Historical Provider, MD  OXYGEN Inhale into the lungs. 2 liters at hs   Yes Historical Provider, MD  potassium  chloride (K-DUR) 10 MEQ tablet Take 10 mEq by mouth at bedtime.    Yes Historical Provider, MD  ranitidine (ZANTAC) 150 MG capsule Take 300 mg by mouth at bedtime.    Yes Historical Provider, MD  sertraline (ZOLOFT) 100 MG tablet Take 150 mg by mouth at bedtime.    Yes Historical Provider, MD  triamterene-hydrochlorothiazide (DYAZIDE) 37.5-25 MG per capsule Take 1 capsule by mouth every morning.   Yes Historical Provider, MD  docusate sodium (COLACE) 100 MG capsule Take 1 capsule (100 mg total) by mouth 2 (two) times daily. Patient taking differently: Take 100 mg by mouth daily as needed for moderate constipation.  01/02/16   Danae Orleans, PA-C  ferrous sulfate 325 (65 FE) MG tablet Take 1 tablet (325 mg total) by mouth 3 (three) times daily after meals. Patient not taking: Reported on 01/08/2016 01/02/16   Danae Orleans, PA-C  nitroGLYCERIN (NITROSTAT) 0.4 MG SL tablet Place 1 tablet (0.4 mg total) under the tongue every 5 (five) minutes as needed for chest pain. Patient not taking: Reported on 01/08/2016 10/26/15 01/24/16  Lendon Colonel, NP  polyethylene glycol [facility] / Floria Raveling) packet Take 17 g by mouth 2 (two) times daily. Patient not taking: Reported on 01/08/2016 01/02/16   Danae Orleans, PA-C    Family History Family History  Problem Relation Age of Onset  . COPD Mother   . Lung disease Father     Asbestosis  . Heart attack Father   . Cerebral aneurysm Brother     Social History Social History  Substance Use Topics  . Smoking status: Current Every Day Smoker    Packs/day: 0.50    Years: 40.00    Types: Cigarettes  . Smokeless tobacco: Never Used  . Alcohol use No     Allergies   Aleve [naproxen sodium]; Codeine; Penicillins; and Sulfonamide derivatives   Review of Systems Review of Systems  All other systems reviewed and are negative.    Physical Exam Updated Vital Signs BP 139/76   Pulse 87   Temp 98.1 F (36.7 C)   Resp 15   Ht '5\' 2"'$  (1.575 m)    Wt 93.9 kg   SpO2 96%   BMI 37.86 kg/m   Physical Exam  Constitutional: She is oriented to person, place, and time. Vital signs are normal. She appears well-developed and well-nourished. No distress.  HENT:  Head: Normocephalic and atraumatic.  Eyes: Conjunctivae and EOM are normal. Pupils are equal, round, and reactive to light.  Neck: Normal range of motion.  Cardiovascular: Normal rate, regular rhythm and normal heart sounds.   Good DP, PT pulses bilaterally.  Pulmonary/Chest: Effort normal and breath sounds normal. She  has no wheezes.  Abdominal: She exhibits no distension. There is no tenderness.  Musculoskeletal: Normal range of motion.  R leg adducted, shorter than L leg ~ 1inch.  Normal R ankle ROM.    Neurological: She is alert and oriented to person, place, and time.  Sensation to light touch intact along R lower extremity.    Skin: Skin is warm and dry.  Psychiatric: She has a normal mood and affect. Her behavior is normal.  Nursing note and vitals reviewed.    ED Treatments / Results  Labs (all labs ordered are listed, but only abnormal results are displayed) Labs Reviewed - No data to display  EKG  EKG Interpretation None       Radiology Dg Chest [facility] 1 View  Result Date: 01/08/2016 CLINICAL DATA:  Pt states her right hip is out of joint. Pt was trying to wash her right foot in her bathroom today and it popped out. Pt having right side lower anterior chest pains today as well. Causing pain when trying to take deep breaths in. Pt takes HTN meds. Not diabetic. Current smoker of over 40 years. Hx of COPD. EXAM: PORTABLE CHEST 1 VIEW COMPARISON:  10/27/2015 FINDINGS: The heart size and mediastinal contours are within normal limits. Both lungs are clear. No pleural effusion or pneumothorax. The visualized skeletal structures are unremarkable. IMPRESSION: No active disease. Electronically Signed   By: Lajean Manes M.D.   On: 01/08/2016 17:36   Dg Hip Unilat With  Pelvis 2-3 Views Right  Result Date: 01/08/2016 CLINICAL DATA:  Right hip pain after injury in shower. EXAM: DG HIP (WITH OR WITHOUT PELVIS) 2-3V RIGHT COMPARISON:  None. FINDINGS: Status post right hip arthroplasty. Superior dislocation of femoral prosthesis relative to acetabulum is noted. Left hip appears normal. No fracture is noted. IMPRESSION: Superior dislocation of right hip prosthesis as described above. Electronically Signed   By: Marijo Conception, M.D.   On: 01/08/2016 16:51    Procedures Procedures (including critical care time)  Medications Ordered in ED Medications  fentaNYL (SUBLIMAZE) injection 100 mcg (100 mcg Intravenous Given 01/08/16 1645)  propofol (DIPRIVAN) 10 mg/mL bolus/IV push 93.9 mg (50 mg Intravenous Given 01/08/16 1902)  fentaNYL (SUBLIMAZE) injection 100 mcg (100 mcg Intravenous Given 01/08/16 1858)  sodium chloride 0.9 % bolus 1,000 mL (1,000 mLs Intravenous New Bag/Given 01/08/16 1859)     Initial Impression / Assessment and Plan / ED Course  I have reviewed the triage vital signs and the nursing notes.  Pertinent labs & imaging results that were available during my care of the patient were reviewed by me and considered in my medical decision making (see chart for details).  Clinical Course     60 yo pmh with h/o THA and revisions x 2, most recent revision on 01/02/16 by Dr. Alvan Dame [facility]) who presents to ED concerned for hip dislocation.  Hip x-ray confirms superior dislocation of R hip prosthesis.  Attempted hip reduction with assist of Dr. Regenia Skeeter in ED. Unable to successfully reduce hip in ED, confirmed by repeat x-ray. Pt tolerated procedure well without complications.  Ortho surgeon consulted (dr. Tonita Cong) who will take patient to OR tonight.  Discussed dispo plan with patient and pt's husband who are agreeable to plan.   Final Clinical Impressions(s) / ED Diagnoses   Final diagnoses:  Dislocation of right hip, initial encounter [facility])     New Prescriptions New Prescriptions   No medications on file     Ritha Sampedro J  Billie Lade 01/08/16 2023    Milton Ferguson, MD 01/09/16 [phone]

## 2016-01-08 NOTE — ED Triage Notes (Signed)
Patient was in the shower. First shower post hip replacement that was 1 week ago. Patient states she turned in the shower and felt her hip pop out. When EMS lifted her to put her on the stretcher she felt it pop like it might have gone back into place. However still cant put any weight on it and leg is shorter than other leg. Patient was given '10mg'$  morphine by EMS.

## 2016-01-08 NOTE — Anesthesia Preprocedure Evaluation (Addendum)
Anesthesia Evaluation  Patient identified by MRN, date of birth, ID band Patient awake    Reviewed: Allergy & Precautions, H&P , NPO status , Patient's Chart, lab work & pertinent test results  Airway Mallampati: II  TM Distance: >3 FB Neck ROM: Full    Dental no notable dental hx. (+) Teeth Intact, Dental Advisory Given   Pulmonary COPD,  oxygen dependent, Current Smoker,    Pulmonary exam normal breath sounds clear to auscultation       Cardiovascular hypertension, Pt. on medications  Rhythm:Regular Rate:Normal     Neuro/Psych  Headaches, Anxiety Depression    GI/Hepatic Neg liver ROS, GERD  Medicated and Controlled,  Endo/Other  Hypothyroidism   Renal/GU negative Renal ROS  negative genitourinary   Musculoskeletal  (+) Arthritis , Osteoarthritis,    Abdominal   Peds  Hematology negative hematology ROS (+) anemia ,   Anesthesia Other Findings   Reproductive/Obstetrics negative OB ROS                            Anesthesia Physical Anesthesia Plan  ASA: III  Anesthesia Plan: General   Post-op Pain Management:    Induction: Intravenous  Airway Management Planned: Oral ETT  Additional Equipment:   Intra-op Plan:   Post-operative Plan: Extubation in OR  Informed Consent: I have reviewed the patients History and Physical, chart, labs and discussed the procedure including the risks, benefits and alternatives for the proposed anesthesia with the patient or authorized representative who has indicated his/her understanding and acceptance.   Dental advisory given  Plan Discussed with: CRNA  Anesthesia Plan Comments:         Anesthesia Quick Evaluation

## 2016-01-08 NOTE — H&P (Signed)
[name] is an 60 y.o. female.   Chief Complaint: Right hip pain HPI: Bent over today felt pain.  Past Medical History:  Diagnosis Date  . Allergic rhinitis   . Anemia   . Anxiety   . Chronic back pain   . COPD (chronic obstructive pulmonary disease) ([place])   . Depression   . Essential hypertension   . GERD (gastroesophageal reflux disease)   . Headache    migraines  . History of gastritis    EGD 2015  . History of home oxygen therapy    2 liters at hs last 6 months  . Hypothyroidism   . Osteoarthritis    oa  . Scoliosis     Past Surgical History:  Procedure Laterality Date  . APPENDECTOMY     1985  . CARDIAC CATHETERIZATION N/A 10/31/2015   Procedure: Left Heart Cath and Coronary Angiography;  Surgeon: Leonie Man, MD;  Location: [facility] CV LAB;  Service: Cardiovascular;  Laterality: N/A;  . CARPAL TUNNEL RELEASE Left   . CARPAL TUNNEL RELEASE Right   . CHOLECYSTECTOMY  late 1980's  . TONSILLECTOMY    . TOTAL ABDOMINAL HYSTERECTOMY     1985, with 1 ovary removed and 2 nd ovary removed 2003  . TOTAL HIP ARTHROPLASTY Right    Original surgery 2006 with revision 2010  . TOTAL HIP REVISION Right 01/01/2016   Procedure: TOTAL HIP REVISION;  Surgeon: Paralee Cancel, MD;  Location: [facility];  Service: Orthopedics;  Laterality: Right;  . ULNAR NERVE TRANSPOSITION Right     Family History  Problem Relation Age of Onset  . COPD Mother   . Lung disease Father     Asbestosis  . Heart attack Father   . Cerebral aneurysm Brother    Social History:  reports that she has been smoking Cigarettes.  She has a 20.00 pack-year smoking history. She has never used smokeless tobacco. She reports that she does not drink alcohol or use drugs.  Allergies:  Allergies  Allergen Reactions  . Aleve [Naproxen Sodium] Other (See Comments)    Headache   . Codeine     REACTION: GI upset  . Penicillins Other (See Comments)    GI upset Has patient had a PCN reaction causing  immediate rash, facial/tongue/throat swelling, SOB or lightheadedness with hypotension: No Has patient had a PCN reaction causing severe rash involving mucus membranes or skin necrosis: No Has patient had a PCN reaction that required hospitalization No Has patient had a PCN reaction occurring within the last 10 years: No If all of the above answers are "NO", then may proceed with Cephalosporin use.   . Sulfonamide Derivatives Hives     (Not in a hospital admission)  No results found for this or any previous visit (from the past 48 hour(s)). Dg Chest Port 1 View  Result Date: 01/08/2016 CLINICAL DATA:  Pt states her right hip is out of joint. Pt was trying to wash her right foot in her bathroom today and it popped out. Pt having right side lower anterior chest pains today as well. Causing pain when trying to take deep breaths in. Pt takes HTN meds. Not diabetic. Current smoker of over 40 years. Hx of COPD. EXAM: PORTABLE CHEST 1 VIEW COMPARISON:  10/27/2015 FINDINGS: The heart size and mediastinal contours are within normal limits. Both lungs are clear. No pleural effusion or pneumothorax. The visualized skeletal structures are unremarkable. IMPRESSION: No active disease. Electronically Signed   By: Shanon Brow  Ormond M.D.   On: 01/08/2016 17:36   Dg Hip Unilat With Pelvis 2-3 Views Right  Result Date: 01/08/2016 CLINICAL DATA:  Right hip pain after injury in shower. EXAM: DG HIP (WITH OR WITHOUT PELVIS) 2-3V RIGHT COMPARISON:  None. FINDINGS: Status post right hip arthroplasty. Superior dislocation of femoral prosthesis relative to acetabulum is noted. Left hip appears normal. No fracture is noted. IMPRESSION: Superior dislocation of right hip prosthesis as described above. Electronically Signed   By: Marijo Conception, M.D.   On: 01/08/2016 16:51    Review of Systems  Musculoskeletal: Positive for joint pain.  All other systems reviewed and are negative.   Blood pressure 139/76, pulse 87,  temperature 98.1 F (36.7 C), resp. rate 15, height '5\' 2"'$  (1.575 m), weight 93.9 kg (207 lb), SpO2 96 %. Physical Exam  Constitutional: She is oriented to person, place, and time. She appears well-developed.  HENT:  Head: Normocephalic.  Eyes: Pupils are equal, round, and reactive to light.  Neck: Normal range of motion.  Cardiovascular: Normal rate.   Respiratory: Effort normal.  GI: Soft.  Musculoskeletal:  Right hip pain. Er and Short. NVI.  Neurological: She is alert and oriented to person, place, and time.  Skin: Skin is warm and dry.  Psychiatric: She has a normal mood and affect.     Assessment/Plan Right Total hip dislocation. Plan reduction under anesthesia since failed attempt in ER. Risks discussed.  Johnn Hai, MD 01/08/2016, 8:14 PM

## 2016-01-08 NOTE — ED Notes (Signed)
Bed: WA09 Expected date:  Expected time:  Means of arrival:  Comments: EMS- 60yo F, hip dislocation

## 2016-01-08 NOTE — Brief Op Note (Signed)
01/08/2016  8:53 PM  PATIENT:  [name]  60 y.o. female  PRE-OPERATIVE DIAGNOSIS:  dislocated left hip  POST-OPERATIVE DIAGNOSIS:  dislocated left hip  PROCEDURE:  Procedure(s): CLOSED MANIPULATION HIP (Right)  SURGEON:  Surgeon(s) and Role:    * Susa Day, MD - Primary  PHYSICIAN ASSISTANT:   ASSISTANTS: none   ANESTHESIA:   general  EBL:  No intake/output data recorded.  BLOOD ADMINISTERED:none  DRAINS: none   LOCAL MEDICATIONS USED:  NONE  SPECIMEN:  No Specimen  DISPOSITION OF SPECIMEN:  N/A  COUNTS:  YES  TOURNIQUET:  * No tourniquets in log *  DICTATION: .Other Dictation: Dictation Number [id number]  PLAN OF CARE: Discharge to home after PACU  PATIENT DISPOSITION:  PACU - hemodynamically stable.   Delay start of Pharmacological VTE agent (>24hrs) due to surgical blood loss or risk of bleeding: no

## 2016-01-08 NOTE — Transfer of Care (Signed)
Immediate Anesthesia Transfer of Care Note  Patient: [name]  Procedure(s) Performed: Procedure(s): CLOSED MANIPULATION HIP (Right)  Patient Location: PACU  Anesthesia Type:General  Level of Consciousness:  sedated, patient cooperative and responds to stimulation  Airway & Oxygen Therapy:Patient Spontanous Breathing and Patient connected to face mask oxgen  Post-op Assessment:  Report given to PACU RN and Post -op Vital signs reviewed and stable  Post vital signs:  Reviewed and stable  Last Vitals:  Vitals:   01/08/16 1943 01/08/16 1946  BP:  139/76  Pulse: 82 87  Resp:  15  Temp:      Complications: No apparent anesthesia complications

## 2016-01-08 NOTE — Anesthesia Procedure Notes (Signed)

## 2016-01-08 NOTE — Anesthesia Postprocedure Evaluation (Signed)
Anesthesia Post Note  Patient: [name]  Procedure(s) Performed: Procedure(s) (LRB): CLOSED MANIPULATION HIP (Right)  Patient location during evaluation: PACU Anesthesia Type: General Level of consciousness: awake and alert Pain management: pain level controlled Vital Signs Assessment: post-procedure vital signs reviewed and stable Respiratory status: spontaneous breathing, nonlabored ventilation and respiratory function stable Cardiovascular status: blood pressure returned to baseline and stable Postop Assessment: no signs of nausea or vomiting Anesthetic complications: no    Last Vitals:  Vitals:   01/08/16 2115 01/08/16 2130  BP: 99/61 115/69  Pulse: 78 77  Resp: 17 13  Temp:      Last Pain:  Vitals:   01/08/16 2115  PainSc: 0-No pain                 Rashard Ryle,W. EDMOND

## 2016-01-08 NOTE — ED Notes (Signed)
OR staff will be coming to get pt

## 2016-01-08 NOTE — Interval H&P Note (Signed)
History and Physical Interval Note:  01/08/2016 8:18 PM  [name]  has presented today for surgery, with the diagnosis of dislocated left hip  The various methods of treatment have been discussed with the patient and family. After consideration of risks, benefits and other options for treatment, the patient has consented to  Procedure(s): [place] (Left) as a surgical intervention .  The patient's history has been reviewed, patient examined, no change in status, stable for surgery.  I have reviewed the patient's chart and labs.  Questions were answered to the patient's satisfaction.     [name]

## 2016-01-09 ENCOUNTER — Encounter (HOSPITAL_COMMUNITY): Payer: Self-pay | Admitting: Specialist

## 2016-01-10 NOTE — Op Note (Signed)
NAME:  [name], [name] NO.:  MEDICAL RECORD NO.:  [id number]  LOCATION:                                 FACILITY:  PHYSICIAN:  Susa Day, M.D.    DATE OF BIRTH:  [birth year]  DATE OF PROCEDURE:  01/08/2016 DATE OF DISCHARGE:                              OPERATIVE REPORT   PREOPERATIVE DIAGNOSIS:  Dislocated right total hip replacement.  POSTOPERATIVE DIAGNOSE:  Dislocated right total hip replacement.  PROCEDURE PERFORMED:  Closed reduction under anesthesia, right total hip replacement.  ANESTHESIA:  General.  ASSISTANT:  None.  HISTORY:  A 60 year old, status post revision total hip by Dr. Roxy Manns 1 week ago, bent over to touch her leg this afternoon, felt acute pain and dislocation of her hip, seen in the emergency room, x-rays indicated dislocation.  She underwent unsuccessful relocation in the emergency room.  She was indicated for relocation under anesthesia.  Risks and benefits were discussed including inability to reduce fracture, etc.  TECHNIQUE:  With the patient in supine position after induction of adequate general anesthesia, she was placed on the radiolucent table. With pelvis stabilized, applied gentle traction of the right lower extremity, flexion, and internal rotation, there was an appreciable reduction noted without significant force applied.  Leg lengths were restored.  She had a stable hip throughout range of motion with flexion to 90, full extension with internal rotation and abduction, external rotation.  Postreduction stress radiographs indicated a concentric reduction of the hip replacement.  No evidence of fracture noted.  She was then placed in a knee immobilizer, extubated without difficulty, transported to the recovery room in satisfactory condition.  Good pulses were noted prior to that.  The patient tolerated the procedure well.  No complications.  No blood loss.     Susa Day, M.D.     Geralynn Rile  D:   01/08/2016  T:  01/09/2016  Job:  [id number]  cc:   Dr. Roxy Manns

## 2016-01-19 ENCOUNTER — Emergency Department (HOSPITAL_COMMUNITY): Payer: BLUE CROSS/BLUE SHIELD

## 2016-01-19 ENCOUNTER — Emergency Department (HOSPITAL_COMMUNITY): Payer: BLUE CROSS/BLUE SHIELD | Admitting: Anesthesiology

## 2016-01-19 ENCOUNTER — Inpatient Hospital Stay (HOSPITAL_COMMUNITY)
Admission: EM | Admit: 2016-01-19 | Discharge: 2016-01-20 | DRG: 561 | Disposition: A | Discharge status: Home / Self Care | Payer: BLUE CROSS/BLUE SHIELD | Attending: Orthopedic Surgery | Admitting: Orthopedic Surgery

## 2016-01-19 ENCOUNTER — Encounter (HOSPITAL_COMMUNITY): Payer: Self-pay | Admitting: *Deleted

## 2016-01-19 ENCOUNTER — Encounter (HOSPITAL_COMMUNITY)
Admission: EM | Disposition: A | Discharge status: Home / Self Care | Payer: Self-pay | Source: Home / Self Care | Attending: Orthopedic Surgery

## 2016-01-19 DIAGNOSIS — E039 Hypothyroidism, unspecified: Secondary | ICD-10-CM | POA: Diagnosis present

## 2016-01-19 DIAGNOSIS — Z7951 Long term (current) use of inhaled steroids: Secondary | ICD-10-CM

## 2016-01-19 DIAGNOSIS — M549 Dorsalgia, unspecified: Secondary | ICD-10-CM | POA: Diagnosis present

## 2016-01-19 DIAGNOSIS — W1839XA Other fall on same level, initial encounter: Secondary | ICD-10-CM | POA: Diagnosis present

## 2016-01-19 DIAGNOSIS — Z886 Allergy status to analgesic agent status: Secondary | ICD-10-CM | POA: Diagnosis not present

## 2016-01-19 DIAGNOSIS — M25551 Pain in right hip: Secondary | ICD-10-CM

## 2016-01-19 DIAGNOSIS — Z880 Allergy status to penicillin: Secondary | ICD-10-CM

## 2016-01-19 DIAGNOSIS — F17210 Nicotine dependence, cigarettes, uncomplicated: Secondary | ICD-10-CM | POA: Diagnosis present

## 2016-01-19 DIAGNOSIS — Z8249 Family history of ischemic heart disease and other diseases of the circulatory system: Secondary | ICD-10-CM | POA: Diagnosis not present

## 2016-01-19 DIAGNOSIS — Z79899 Other long term (current) drug therapy: Secondary | ICD-10-CM

## 2016-01-19 DIAGNOSIS — J449 Chronic obstructive pulmonary disease, unspecified: Secondary | ICD-10-CM | POA: Diagnosis present

## 2016-01-19 DIAGNOSIS — G8929 Other chronic pain: Secondary | ICD-10-CM | POA: Diagnosis present

## 2016-01-19 DIAGNOSIS — M1990 Unspecified osteoarthritis, unspecified site: Secondary | ICD-10-CM | POA: Diagnosis present

## 2016-01-19 DIAGNOSIS — F329 Major depressive disorder, single episode, unspecified: Secondary | ICD-10-CM | POA: Diagnosis present

## 2016-01-19 DIAGNOSIS — Z419 Encounter for procedure for purposes other than remedying health state, unspecified: Secondary | ICD-10-CM

## 2016-01-19 DIAGNOSIS — Z6838 Body mass index (BMI) 38.0-38.9, adult: Secondary | ICD-10-CM | POA: Diagnosis not present

## 2016-01-19 DIAGNOSIS — Y92000 Kitchen of unspecified non-institutional (private) residence as  the place of occurrence of the external cause: Secondary | ICD-10-CM | POA: Diagnosis not present

## 2016-01-19 DIAGNOSIS — K219 Gastro-esophageal reflux disease without esophagitis: Secondary | ICD-10-CM | POA: Diagnosis present

## 2016-01-19 DIAGNOSIS — F419 Anxiety disorder, unspecified: Secondary | ICD-10-CM | POA: Diagnosis present

## 2016-01-19 DIAGNOSIS — Z9981 Dependence on supplemental oxygen: Secondary | ICD-10-CM | POA: Diagnosis not present

## 2016-01-19 DIAGNOSIS — Z885 Allergy status to narcotic agent status: Secondary | ICD-10-CM

## 2016-01-19 DIAGNOSIS — T84020A Dislocation of internal right hip prosthesis, initial encounter: Principal | ICD-10-CM | POA: Diagnosis present

## 2016-01-19 DIAGNOSIS — S73004A Unspecified dislocation of right hip, initial encounter: Secondary | ICD-10-CM

## 2016-01-19 DIAGNOSIS — M419 Scoliosis, unspecified: Secondary | ICD-10-CM | POA: Diagnosis present

## 2016-01-19 DIAGNOSIS — Z7982 Long term (current) use of aspirin: Secondary | ICD-10-CM

## 2016-01-19 DIAGNOSIS — W19XXXA Unspecified fall, initial encounter: Secondary | ICD-10-CM

## 2016-01-19 DIAGNOSIS — S52121A Displaced fracture of head of right radius, initial encounter for closed fracture: Secondary | ICD-10-CM

## 2016-01-19 DIAGNOSIS — Z825 Family history of asthma and other chronic lower respiratory diseases: Secondary | ICD-10-CM | POA: Diagnosis not present

## 2016-01-19 DIAGNOSIS — I10 Essential (primary) hypertension: Secondary | ICD-10-CM | POA: Diagnosis present

## 2016-01-19 DIAGNOSIS — Z882 Allergy status to sulfonamides status: Secondary | ICD-10-CM | POA: Diagnosis not present

## 2016-01-19 DIAGNOSIS — Z9889 Other specified postprocedural states: Secondary | ICD-10-CM

## 2016-01-19 DIAGNOSIS — S73034A Other anterior dislocation of right hip, initial encounter: Secondary | ICD-10-CM | POA: Diagnosis present

## 2016-01-19 HISTORY — PX: HIP CLOSED REDUCTION: SHX983

## 2016-01-19 LAB — CBC WITH DIFFERENTIAL/PLATELET
Basophils Absolute: 0 10*3/uL (ref 0.0–0.1)
Basophils Relative: 0 %
Eosinophils Absolute: 0.6 10*3/uL (ref 0.0–0.7)
Eosinophils Relative: 7 %
HEMATOCRIT: 32 % — AB (ref 36.0–46.0)
HEMOGLOBIN: 9.9 g/dL — AB (ref 12.0–15.0)
LYMPHS ABS: 2.4 10*3/uL (ref 0.7–4.0)
LYMPHS PCT: 31 %
MCH: 25.9 pg — AB (ref 26.0–34.0)
MCHC: 30.9 g/dL (ref 30.0–36.0)
MCV: 83.8 fL (ref 78.0–100.0)
Monocytes Absolute: 0.8 10*3/uL (ref 0.1–1.0)
Monocytes Relative: 10 %
NEUTROS PCT: 52 %
Neutro Abs: 3.9 10*3/uL (ref 1.7–7.7)
Platelets: 337 10*3/uL (ref 150–400)
RBC: 3.82 MIL/uL — AB (ref 3.87–5.11)
RDW: 17 % — ABNORMAL HIGH (ref 11.5–15.5)
WBC: 7.7 10*3/uL (ref 4.0–10.5)

## 2016-01-19 LAB — BASIC METABOLIC PANEL
Anion gap: 8 (ref 5–15)
BUN: 19 mg/dL (ref 6–20)
CHLORIDE: 110 mmol/L (ref 101–111)
CO2: 23 mmol/L (ref 22–32)
Calcium: 8.2 mg/dL — ABNORMAL LOW (ref 8.9–10.3)
Creatinine, Ser: 0.84 mg/dL (ref 0.44–1.00)
GFR calc Af Amer: >60 mL/min (ref >60)
GFR calc non Af Amer: >60 mL/min (ref >60)
GLUCOSE: 91 mg/dL (ref 65–99)
POTASSIUM: 3.8 mmol/L (ref 3.5–5.1)
Sodium: 141 mmol/L (ref 135–145)

## 2016-01-19 LAB — TROPONIN I: Troponin I: <0.03 ng/mL (ref <0.03)

## 2016-01-19 LAB — BRAIN NATRIURETIC PEPTIDE: B Natriuretic Peptide: 22.4 pg/mL (ref 0.0–100.0)

## 2016-01-19 SURGERY — CLOSED REDUCTION, HIP
Anesthesia: General | Site: Hip | Laterality: Right

## 2016-01-19 MED ORDER — HYDROCODONE-ACETAMINOPHEN 10-325 MG PO TABS
1.0000 | ORAL_TABLET | Freq: Four times a day (QID) | ORAL | Status: DC | PRN
  Administered 2016-01-20: 1 via ORAL
  Filled 2016-01-19: qty 2

## 2016-01-19 MED ORDER — PANTOPRAZOLE SODIUM 40 MG PO TBEC: 40.0000 mg | DELAYED_RELEASE_TABLET | Freq: Every day | ORAL | Status: DC

## 2016-01-19 MED ORDER — FENTANYL CITRATE (PF) 100 MCG/2ML IJ SOLN
100.0000 ug | Freq: Once | INTRAMUSCULAR | Status: AC
  Administered 2016-01-19: 100 ug via INTRAVENOUS
  Filled 2016-01-19: qty 2

## 2016-01-19 MED ORDER — HYDROMORPHONE HCL 1 MG/ML IJ SOLN
INTRAMUSCULAR | Status: AC
  Filled 2016-01-19: qty 1

## 2016-01-19 MED ORDER — ACETAMINOPHEN 325 MG PO TABS: 650.0000 mg | ORAL_TABLET | Freq: Four times a day (QID) | ORAL | Status: DC | PRN

## 2016-01-19 MED ORDER — POTASSIUM CHLORIDE ER 10 MEQ PO TBCR: 10.0000 meq | EXTENDED_RELEASE_TABLET | Freq: Every day | ORAL | Status: DC

## 2016-01-19 MED ORDER — FLUTICASONE PROPIONATE 50 MCG/ACT NA SUSP
2.0000 | Freq: Every day | NASAL | Status: DC
  Filled 2016-01-19: qty 16

## 2016-01-19 MED ORDER — MIDAZOLAM HCL 2 MG/2ML IJ SOLN: 0.5000 mg | Freq: Once | INTRAMUSCULAR | Status: DC | PRN

## 2016-01-19 MED ORDER — HYDROMORPHONE HCL 1 MG/ML IJ SOLN
0.2500 mg | INTRAMUSCULAR | Status: DC | PRN
  Administered 2016-01-19 (×2): 0.5 mg via INTRAVENOUS

## 2016-01-19 MED ORDER — PROMETHAZINE HCL 25 MG/ML IJ SOLN: 6.2500 mg | INTRAMUSCULAR | Status: DC | PRN

## 2016-01-19 MED ORDER — MORPHINE SULFATE (PF) 2 MG/ML IV SOLN
2.0000 mg | INTRAVENOUS | Status: DC | PRN
  Administered 2016-01-20 (×7): 2 mg via INTRAVENOUS
  Filled 2016-01-19 (×7): qty 1

## 2016-01-19 MED ORDER — ATORVASTATIN CALCIUM 20 MG PO TABS
20.0000 mg | ORAL_TABLET | Freq: Every day | ORAL | Status: DC
  Administered 2016-01-20: 20 mg via ORAL
  Filled 2016-01-19: qty 1

## 2016-01-19 MED ORDER — SODIUM CHLORIDE 0.9 % IV SOLN
INTRAVENOUS | Status: DC | PRN
  Administered 2016-01-19: 21:00:00 via INTRAVENOUS

## 2016-01-19 MED ORDER — LORATADINE 10 MG PO TABS: 10.0000 mg | ORAL_TABLET | Freq: Every day | ORAL | Status: DC

## 2016-01-19 MED ORDER — PROPOFOL 10 MG/ML IV BOLUS
INTRAVENOUS | Status: AC | PRN
  Administered 2016-01-19: 47 mg via INTRAVENOUS

## 2016-01-19 MED ORDER — LEVOTHYROXINE SODIUM 75 MCG PO TABS: 75.0000 ug | ORAL_TABLET | Freq: Every day | ORAL | Status: DC

## 2016-01-19 MED ORDER — FENTANYL CITRATE (PF) 100 MCG/2ML IJ SOLN
100.0000 ug | Freq: Once | INTRAMUSCULAR | Status: DC
  Filled 2016-01-19: qty 2

## 2016-01-19 MED ORDER — METHOCARBAMOL 500 MG PO TABS: 500.0000 mg | ORAL_TABLET | Freq: Four times a day (QID) | ORAL | Status: DC | PRN

## 2016-01-19 MED ORDER — PROPOFOL 10 MG/ML IV BOLUS
INTRAVENOUS | Status: DC | PRN
  Administered 2016-01-19: 100 mg via INTRAVENOUS
  Administered 2016-01-19: 110 mg via INTRAVENOUS
  Administered 2016-01-19: 90 mg via INTRAVENOUS

## 2016-01-19 MED ORDER — ASPIRIN 81 MG PO CHEW
81.0000 mg | CHEWABLE_TABLET | Freq: Two times a day (BID) | ORAL | Status: DC
  Administered 2016-01-20: 81 mg via ORAL
  Filled 2016-01-19: qty 1

## 2016-01-19 MED ORDER — SODIUM CHLORIDE 0.9 % IV SOLN
INTRAVENOUS | Status: AC | PRN
  Administered 2016-01-19: 100 mL/h via INTRAVENOUS

## 2016-01-19 MED ORDER — ALPRAZOLAM 1 MG PO TABS
1.0000 mg | ORAL_TABLET | Freq: Four times a day (QID) | ORAL | Status: DC
  Administered 2016-01-20 (×2): 1 mg via ORAL
  Filled 2016-01-19 (×2): qty 1

## 2016-01-19 MED ORDER — POTASSIUM CHLORIDE CRYS ER 10 MEQ PO TBCR
10.0000 meq | EXTENDED_RELEASE_TABLET | Freq: Every day | ORAL | Status: DC
  Administered 2016-01-20: 10 meq via ORAL
  Filled 2016-01-19: qty 1

## 2016-01-19 MED ORDER — SUCCINYLCHOLINE CHLORIDE 200 MG/10ML IV SOSY
PREFILLED_SYRINGE | INTRAVENOUS | Status: DC | PRN
  Administered 2016-01-19: 80 mg via INTRAVENOUS

## 2016-01-19 MED ORDER — PROPOFOL 10 MG/ML IV BOLUS
INTRAVENOUS | Status: AC
  Filled 2016-01-19: qty 20

## 2016-01-19 MED ORDER — SUCCINYLCHOLINE CHLORIDE 200 MG/10ML IV SOSY
PREFILLED_SYRINGE | INTRAVENOUS | Status: AC
  Filled 2016-01-19: qty 10

## 2016-01-19 MED ORDER — SERTRALINE HCL 50 MG PO TABS
150.0000 mg | ORAL_TABLET | Freq: Every day | ORAL | Status: DC
  Administered 2016-01-20: 150 mg via ORAL
  Filled 2016-01-19: qty 3

## 2016-01-19 MED ORDER — NICOTINE 21 MG/24HR TD PT24
21.0000 mg | MEDICATED_PATCH | Freq: Every day | TRANSDERMAL | Status: DC
  Administered 2016-01-20 (×2): 21 mg via TRANSDERMAL
  Filled 2016-01-19 (×2): qty 1

## 2016-01-19 MED ORDER — KCL IN DEXTROSE-NACL 20-5-0.45 MEQ/L-%-% IV SOLN
INTRAVENOUS | Status: DC
  Administered 2016-01-20: 02:00:00 50 mL/h via INTRAVENOUS
  Filled 2016-01-19 (×2): qty 1000

## 2016-01-19 MED ORDER — PROPOFOL 10 MG/ML IV BOLUS
0.5000 mg/kg | Freq: Once | INTRAVENOUS | Status: AC
  Administered 2016-01-19: 47 mg via INTRAVENOUS
  Filled 2016-01-19: qty 20

## 2016-01-19 MED ORDER — MEPERIDINE HCL 50 MG/ML IJ SOLN: 6.2500 mg | INTRAMUSCULAR | Status: DC | PRN

## 2016-01-19 MED ORDER — TRIAMTERENE-HCTZ 37.5-25 MG PO TABS: 1.0000 | ORAL_TABLET | Freq: Every day | ORAL | Status: DC

## 2016-01-19 MED ORDER — DOCUSATE SODIUM 100 MG PO CAPS
100.0000 mg | ORAL_CAPSULE | Freq: Two times a day (BID) | ORAL | Status: DC
  Administered 2016-01-20: 100 mg via ORAL
  Filled 2016-01-19: qty 1

## 2016-01-19 MED ORDER — FAMOTIDINE 20 MG PO TABS
40.0000 mg | ORAL_TABLET | Freq: Every day | ORAL | Status: DC
  Administered 2016-01-20: 40 mg via ORAL
  Filled 2016-01-19: qty 2

## 2016-01-19 MED ORDER — ACETAMINOPHEN 650 MG RE SUPP: 650.0000 mg | Freq: Four times a day (QID) | RECTAL | Status: DC | PRN

## 2016-01-19 SURGICAL SUPPLY — 1 items: SLING ARM FOAM STRAP MED (SOFTGOODS) ×3 IMPLANT

## 2016-01-19 NOTE — Anesthesia Postprocedure Evaluation (Signed)
Anesthesia Post Note  Patient: [name]  Procedure(s) Performed: Procedure(s) (LRB): ATTEMPTED CLOSED REDUCTION RIGHT HIP (Right)  Patient location during evaluation: PACU Anesthesia Type: General Level of consciousness: awake and alert, oriented and patient cooperative Pain management: pain level controlled Vital Signs Assessment: post-procedure vital signs reviewed and stable Respiratory status: spontaneous breathing, nonlabored ventilation, respiratory function stable and patient connected to nasal cannula oxygen Cardiovascular status: blood pressure returned to baseline and stable Postop Assessment: no signs of nausea or vomiting Anesthetic complications: no       Last Vitals:  Vitals:   01/19/16 2241 01/19/16 2245  BP: 131/63 (!) 126/93  Pulse: 78 79  Resp: 14   Temp: (P) 36.8 C     Last Pain:  Vitals:   01/19/16 2040  TempSrc:   PainSc: 0-No pain                 Draycen Leichter,E. Tomeika Weinmann

## 2016-01-19 NOTE — ED Notes (Signed)
Bed: WA25 Expected date:  Expected time:  Means of arrival:  Comments: 

## 2016-01-19 NOTE — ED Notes (Signed)
Patient transported to X-ray 

## 2016-01-19 NOTE — Progress Notes (Addendum)
Pt recently seen by [facility] CM and assisted with DME Arranged:  3-N-1, Walker rolling Via DME Agency:  [place]. On 01/08/16 pt had Closed reduction under anesthesia, right total hip replacement. Return to [facility] ED 01/19/16 from home with c/o hip pain/injury. Per EMS pt had a recent hip surgery, today she twisted and felt her hip "pop-out. Pt fell and was on the floor for over an hours before she was found noted shortening of leg Pt from [place] with pcp as Dr Scotty Court with [facility] ED visits x 2 and 1 admission in last 6 months

## 2016-01-19 NOTE — Transfer of Care (Signed)
Immediate Anesthesia Transfer of Care Note  Patient: [name]  Procedure(s) Performed: Procedure(s): ATTEMPTED CLOSED REDUCTION RIGHT HIP (Right)  Patient Location: PACU  Anesthesia Type:General  Level of Consciousness: awake, alert  and oriented  Airway & Oxygen Therapy: Patient Spontanous Breathing and Patient connected to face mask oxygen  Post-op Assessment: Report given to RN and Post -op Vital signs reviewed and stable  Post vital signs: Reviewed and stable  Last Vitals:  Vitals:   01/19/16 2115 01/19/16 2130  BP: 114/60 117/59  Pulse: 69 71  Resp: 14 10  Temp:      Last Pain:  Vitals:   01/19/16 2040  TempSrc:   PainSc: 0-No pain         Complications: No apparent anesthesia complications

## 2016-01-19 NOTE — ED Provider Notes (Signed)
Medical screening examination/treatment/procedure(s) were conducted as a shared visit with non-physician practitioner(s) and myself.  I personally evaluated the patient during the encounter. Briefly, the patient is a 60 y.o. female 60 year old female who presents to the ED after a mechanical fall found to have right prosthetic hip dislocation.    Reduction of dislocation Performed by: Grayce Sessions Cardama Consent: Written/Verbal consent obtained. Risks and benefits: risks, benefits and alternatives were discussed Consent given by: patient Required items: required blood products, implants, devices, and special equipment available Time out: Immediately prior to procedure a "time out" was called to verify the correct patient, procedure, equipment, support staff and site/side marked as required.  Patient sedated: 70 mg Propofol  Vitals: Vital signs were monitored during sedation. Patient tolerance: Patient tolerated the procedure well with no immediate complications. Joint: right hip Reduction technique: manipulation  Attempt unsuccessful. Orthopedic surgery consultation obtained. Patient taken to the OR for further management.   Fatima Blank, MD 01/20/16 [phone]

## 2016-01-19 NOTE — Brief Op Note (Signed)
01/19/2016  10:51 PM  PATIENT:  [name]  60 y.o. female  PRE-OPERATIVE DIAGNOSIS:  Right anterior hip periprosthetic dislocation  POST-OPERATIVE DIAGNOSIS:  same  Procedure(s):  Closed reduction of right hip dislocation under anesthesia - failed  SURGEON:  Wylene Simmer, MD  ASSISTANT: n/a  ANESTHESIA:   General  EBL:  none  TOURNIQUET:  n/a  COMPLICATIONS:  None apparent  DISPOSITION:  Extubated, awake and stable to recovery.  DICTATION ID:  [id number]

## 2016-01-19 NOTE — ED Notes (Signed)
OR tech here to take pt to OR

## 2016-01-19 NOTE — ED Provider Notes (Signed)
[facility] DEPT Provider Note   CSN: [id number] Arrival date & time: 01/19/16  1455     History   Chief Complaint Chief Complaint  Patient presents with  . Hip Pain    HPI [name] is a 60 y.o. female with pertinent pmh of R THA in 2006, revision of THA in 2010 and second revision of THA by Dr. Alvan Dame on 01/02/16 who presents to emergency department complaining of R hip pain.  Pt states she was in her kitchen today, she turned to the R to walk outside and felt her R hip pop out.  Pt felt immediate R hip pain.  Pt fell on her R side and struck her R elbow.  Pt reports R shoulder, R elbow and R wrist pain.  Pt taking aspirin daily. Pt also reports L sided anterior chest pain.  Pt states this pain started the last time she came to the ED for hip dislocation (01/09/16), pt states she remembers her chest cracked when EMS was transferring over to the stretcher. Pt has h/o COPD, tobacco abuse, HTN, obesity.   Unfortunately, pt was seen in ED by me on 01/09/16 for the same, x-ray that day showed R hip dislocation.  We were unable to reduce her hip and Dr. Tonita Cong took her to the OR for a closed reduction.  Pt states after reduction in the OR she has been doing well, has had minimal R hip pain.    HPI  Past Medical History:  Diagnosis Date  . Allergic rhinitis   . Anemia   . Anxiety   . Chronic back pain   . COPD (chronic obstructive pulmonary disease) ([place])   . Depression   . Essential hypertension   . GERD (gastroesophageal reflux disease)   . Headache    migraines  . History of gastritis    EGD 2015  . History of home oxygen therapy    2 liters at hs last 6 months  . Hypothyroidism   . Osteoarthritis    oa  . Scoliosis     Patient Active Problem List   Diagnosis Date Noted  . Anterior dislocation of right hip ([place]) 01/19/2016  . S/P revision of total hip 01/01/2016  . Abnormal nuclear stress test: Intermediate Risk 10/31/2015  . Atypical angina ([place]) 10/31/2015  .  Hip bursitis 08/20/2012  . Trigger finger, acquired 08/20/2012  . TOBACCO USER 12/12/2008  . DYSLIPIDEMIA 11/17/2008  . ANXIETY DISORDER 11/16/2008  . HYPERTENSION, BENIGN 11/16/2008  . GEN OSTEOARTHROSIS INVOLVING MULTIPLE SITES 11/16/2008  . CHEST DISCOMFORT, ATYPICAL 11/16/2008    Past Surgical History:  Procedure Laterality Date  . APPENDECTOMY     1985  . CARDIAC CATHETERIZATION N/A 10/31/2015   Procedure: Left Heart Cath and Coronary Angiography;  Surgeon: Leonie Man, MD;  Location: [facility];  Service: Cardiovascular;  Laterality: N/A;  . CARPAL TUNNEL RELEASE Left   . CARPAL TUNNEL RELEASE Right   . CHOLECYSTECTOMY  late 1980's  . HIP CLOSED REDUCTION Right 01/08/2016   Procedure: CLOSED MANIPULATION HIP;  Surgeon: Susa Day, MD;  Location: [facility];  Service: Orthopedics;  Laterality: Right;  . TONSILLECTOMY    . TOTAL ABDOMINAL HYSTERECTOMY     1985, with 1 ovary removed and 2 nd ovary removed 2003  . TOTAL HIP ARTHROPLASTY Right    Original surgery 2006 with revision 2010  . TOTAL HIP REVISION Right 01/01/2016   Procedure: TOTAL HIP REVISION;  Surgeon: Paralee Cancel, MD;  Location:  [facility];  Service: Orthopedics;  Laterality: Right;  . ULNAR NERVE TRANSPOSITION Right     OB History    No data available       Home Medications    Prior to Admission medications   Medication Sig Start Date End Date Taking? Authorizing Provider  ALPRAZolam Duanne Moron) 1 MG tablet Take 1 mg by mouth 4 (four) times daily.   Yes Historical Provider, MD  aspirin 81 MG chewable tablet Chew 1 tablet (81 mg total) by mouth 2 (two) times daily. Take for 4 weeks. 01/02/16  Yes Matthew Babish, PA-C  atorvastatin (LIPITOR) 20 MG tablet Take 20 mg by mouth at bedtime.    Yes Historical Provider, MD  docusate sodium (COLACE) 100 MG capsule Take 1 capsule (100 mg total) by mouth 2 (two) times daily. Patient taking differently: Take 100 mg by mouth daily as needed for moderate constipation.   01/02/16  Yes Matthew Babish, PA-C  fluticasone (FLONASE) 50 MCG/ACT nasal spray Place 2 sprays into both nostrils daily.   Yes Historical Provider, MD  HYDROcodone-acetaminophen (NORCO) 10-325 MG tablet Take 1-2 tablets by mouth every 4 (four) hours as needed. Patient taking differently: Take 1-2 tablets by mouth every 4 (four) hours as needed for moderate pain or severe pain.  01/02/16  Yes Danae Orleans, PA-C  levothyroxine (SYNTHROID, LEVOTHROID) 75 MCG tablet Take 75 mcg by mouth daily before breakfast.   Yes Historical Provider, MD  loratadine (CLARITIN) 10 MG tablet Take 10 mg by mouth daily.   Yes Historical Provider, MD  methocarbamol (ROBAXIN) 500 MG tablet Take 1 tablet (500 mg total) by mouth every 6 (six) hours as needed for muscle spasms. 01/02/16  Yes Danae Orleans, PA-C  omeprazole (PRILOSEC) 20 MG capsule Take 20 mg by mouth daily.   Yes Historical Provider, MD  potassium chloride (K-DUR) 10 MEQ tablet Take 10 mEq by mouth at bedtime.    Yes Historical Provider, MD  ranitidine (ZANTAC) 150 MG capsule Take 300 mg by mouth at bedtime.    Yes Historical Provider, MD  sertraline (ZOLOFT) 100 MG tablet Take 150 mg by mouth at bedtime.    Yes Historical Provider, MD  triamterene-hydrochlorothiazide (DYAZIDE) 37.5-25 MG per capsule Take 1 capsule by mouth every morning.   Yes Historical Provider, MD  ferrous sulfate 325 (65 FE) MG tablet Take 1 tablet (325 mg total) by mouth 3 (three) times daily after meals. Patient not taking: Reported on 01/08/2016 01/02/16   Danae Orleans, PA-C  nitroGLYCERIN (NITROSTAT) 0.4 MG SL tablet Place 1 tablet (0.4 mg total) under the tongue every 5 (five) minutes as needed for chest pain. Patient not taking: Reported on 01/08/2016 10/26/15 01/24/16  Lendon Colonel, NP  OXYGEN Inhale into the lungs. 2 liters at hs    Historical Provider, MD  polyethylene glycol (MIRALAX / GLYCOLAX) packet Take 17 g by mouth 2 (two) times daily. Patient not taking: Reported  on 01/08/2016 01/02/16   Danae Orleans, PA-C    Family History Family History  Problem Relation Age of Onset  . COPD Mother   . Lung disease Father     Asbestosis  . Heart attack Father   . Cerebral aneurysm Brother     Social History Social History  Substance Use Topics  . Smoking status: Current Every Day Smoker    Packs/day: 0.50    Years: 40.00    Types: Cigarettes  . Smokeless tobacco: Never Used  . Alcohol use No     Allergies  Aleve [naproxen sodium]; Codeine; Penicillins; and Sulfonamide derivatives   Review of Systems Review of Systems  Constitutional: Negative for fever.  HENT: Negative for congestion.   Eyes: Negative for visual disturbance.  Respiratory: Negative for cough, chest tightness, shortness of breath and wheezing.   Cardiovascular: Positive for chest pain. Negative for palpitations and leg swelling.  Gastrointestinal: Negative for abdominal pain, constipation, diarrhea, nausea and vomiting.  Genitourinary: Negative for difficulty urinating.  Musculoskeletal: Positive for arthralgias and gait problem. Negative for neck pain and neck stiffness.  Skin: Negative for rash.  Neurological: Negative for dizziness, syncope, facial asymmetry, weakness, light-headedness and numbness.  Hematological: Does not bruise/bleed easily.  Psychiatric/Behavioral: Negative.      Physical Exam Updated Vital Signs BP 115/61 (BP Location: Left Arm)   Pulse 95   Temp 98.4 F (36.9 C) (Oral)   Resp 16   Ht '5\' 2"'$  (1.575 m)   Wt 95.3 kg   SpO2 92%   BMI 38.41 kg/m   Physical Exam  Constitutional: She is oriented to person, place, and time. Vital signs are normal. She appears well-developed and well-nourished.  HENT:  Head: Normocephalic and atraumatic.  Nose: Nose normal.  Mouth/Throat: No oropharyngeal exudate.  Eyes: EOM are normal. Pupils are equal, round, and reactive to light.  Neck: Normal range of motion. Neck supple.  Cardiovascular: Normal rate.     Pulmonary/Chest: Effort normal and breath sounds normal.  Abdominal: Soft. There is no tenderness.  Musculoskeletal:  No cervical midline tenderness. Full neck ROM.   R leg is shortened and external rotated.  No R hip or knee ROM due to pain. Full R ankle ROM.  L sided chest wall tenderness.   Lymphadenopathy:    She has no cervical adenopathy.  Neurological: She is alert and oriented to person, place, and time.  Sensation to light touch intact in bilateral lower extremities.  4/5 strength with R ankle plantar flexion and dorsiflexion. 5/5 strength with L ankle plantar flexion and dorsiflexion.  Skin: Skin is warm and dry.  Psychiatric: She has a normal mood and affect. Her behavior is normal.  Nursing note and vitals reviewed.    ED Treatments / Results  Labs (all labs ordered are listed, but only abnormal results are displayed) Labs Reviewed  SURGICAL PCR SCREEN - Abnormal; Notable for the following:       Result Value   MRSA, PCR INVALID RESULTS, SPECIMEN SENT FOR CULTURE (*)    Staphylococcus aureus INVALID RESULTS, SPECIMEN SENT FOR CULTURE (*)    All other components within normal limits  CBC WITH DIFFERENTIAL/PLATELET - Abnormal; Notable for the following:    RBC 3.82 (*)    Hemoglobin 9.9 (*)    HCT 32.0 (*)    MCH 25.9 (*)    RDW 17.0 (*)    All other components within normal limits  BASIC METABOLIC PANEL - Abnormal; Notable for the following:    Calcium 8.2 (*)    All other components within normal limits  MRSA CULTURE  TROPONIN I  BRAIN NATRIURETIC PEPTIDE    EKG  EKG Interpretation  Date/Time:  Friday January 19 2016 16:56:15 EST Ventricular Rate:  70 PR Interval:    QRS Duration: 90 QT Interval:  446 QTC Calculation: 482 R Axis:   38 Text Interpretation:  Sinus rhythm Confirmed by Alvino Chapel  MD, NATHAN [phone]) on 01/20/2016 2:56:59 PM       Radiology Dg Chest 1 View  Result Date: 01/19/2016 CLINICAL DATA:  Fall at  home today, shoulder pain.   COPD. EXAM: CHEST 1 VIEW COMPARISON:  01/08/2016 FINDINGS: Heart size within normal limits for the supine AP projection. Reverse lordotic projection. The lungs appear clear. No pleural effusion identified. IMPRESSION: 1.  No significant abnormality identified. Electronically Signed   By: Van Clines M.D.   On: 01/19/2016 16:35   Dg Shoulder Right  Result Date: 01/19/2016 CLINICAL DATA:  Pain in the right shoulder after fall today. EXAM: RIGHT SHOULDER - 2+ VIEW COMPARISON:  None. FINDINGS: There is no evidence of fracture or dislocation. Mild spurring at the [facility] joint. No acute abnormalities observed. There is evidence of cervical spondylosis on the frontal projection. IMPRESSION: 1. Mild degenerative AC joint spurring, without acute radiographic findings. No dislocation or fracture seen. Electronically Signed   By: Van Clines M.D.   On: 01/19/2016 16:39   Dg Elbow Complete Right  Result Date: 01/19/2016 CLINICAL DATA:  Fall, elbow pain. EXAM: RIGHT ELBOW - COMPLETE 3+ VIEW COMPARISON:  None. FINDINGS: Mildly displaced acute fracture of the radial head, with associated small elbow joint effusion (anterior sail sign) and volar bulging of the supinator fat pad. I do not observe any other fractures. There is some mild spurring along the coronoid process of the ulna, and the lateral epicondyle. IMPRESSION: 1. Mildly displaced radial head fracture, potentially with some mild comminution. This is accompanied by anterior bowing of the supinator fat pad and a small elbow joint effusion. Electronically Signed   By: Van Clines M.D.   On: 01/19/2016 16:42   Dg Forearm Right  Result Date: 01/19/2016 CLINICAL DATA:  Recent fall with known proximal radial fracture EXAM: RIGHT FOREARM - 2 VIEW COMPARISON:  CT from earlier in the same day. FINDINGS: Radial head fracture with impaction is again identified and stable from the prior study. Elevation the anterior fat pad is noted. No other focal  abnormality is seen. IMPRESSION: Radial head fracture similar to that seen on prior CT. Electronically Signed   By: Inez Catalina M.D.   On: 01/19/2016 20:35   Dg Wrist Complete Right  Result Date: 01/19/2016 CLINICAL DATA:  RIGHT wrist and elbow pain after falling today, fell onto RIGHT side and caught her fall with an outstretched hand EXAM: RIGHT WRIST - COMPLETE 3+ VIEW COMPARISON:  None FINDINGS: Osseous demineralization. Joint space narrowing at first MCP joint. Joint spaces otherwise preserved. No acute fracture, dislocation, or bone destruction. IMPRESSION: No acute osseous abnormalities. Degenerative changes RIGHT first MCP joint. Electronically Signed   By: Lavonia Dana M.D.   On: 01/19/2016 20:37   Dg Pelvis Portable  Result Date: 01/20/2016 CLINICAL DATA:  Status post reduction of right hip prosthesis EXAM: PORTABLE PELVIS 1-2 VIEWS COMPARISON:  01/19/2016 FINDINGS: Pelvic ring is intact. No acute fracture is seen. The femoral portion of right hip prosthesis is now well seated within the acetabular component. No soft tissue abnormality is noted. IMPRESSION: Status post reduction of right hip prosthesis. Electronically Signed   By: Inez Catalina M.D.   On: 01/20/2016 19:08   Ct Elbow Right Wo Contrast  Result Date: 01/19/2016 CLINICAL DATA:  Radial head fracture due to fall today EXAM: CT OF THE LOWER RIGHT EXTREMITY WITHOUT CONTRAST TECHNIQUE: Multidetector CT imaging of the right lower extremity was performed according to the standard protocol. COMPARISON:  Radiographs 01/19/2016 FINDINGS: Bones/Joint/Cartilage Comminuted intra-articular radial head fracture with approximately 3 mm depression of the lateral fracture fragment. Suspect tiny fracture off the coronoid process of the ulna, series 604, image  number 21. Suspect osteochondral injury of the capitulum with tiny 1-2 mm fracture fragment/loose body, series 602, image number 37. No dislocation is evident. Large elbow effusion is present.  Ligaments Suboptimally assessed by CT. Muscles and Tendons Normal bulk and no fatty atrophy. Triceps tendon appears grossly intact. Soft tissues Moderate edema and soft tissue swelling around the proximal radius. IMPRESSION: 1. Comminuted intra-articular radial head fracture with approximately 3 mm depression of the lateral fracture fragment 2. Suspect tiny coronoid process fracture of the ulna 3. Suspect osteochondral injury of the capitulum with tiny 1-2 mm fracture fragment/loose body within the joint space. Electronically Signed   By: Donavan Foil M.D.   On: 01/19/2016 19:55   Dg Hip Port Unilat With Pelvis 1v Right  Result Date: 01/19/2016 CLINICAL DATA:  Right hip dislocation EXAM: DG HIP (WITH OR WITHOUT PELVIS) 1V PORT RIGHT COMPARISON:  None. FINDINGS: Multiple images demonstrate continued in malposition of the femoral component with respect to the acetabular component. This is at least a subluxation if not persistent dislocation. All of the images were obtained in in nearly the same projection. IMPRESSION: Persistent subluxation or dislocation about the right total hip arthroplasty, although the femoral component is considerably closer to the acetabular component now. Electronically Signed   By: Andreas Newport M.D.   On: 01/19/2016 23:15   Dg Hip Unilat With Pelvis 2-3 Views Right  Result Date: 01/19/2016 CLINICAL DATA:  Pain in right hip after a fall today. EXAM: DG HIP (WITH OR WITHOUT PELVIS) 2-3V RIGHT COMPARISON:  None. FINDINGS: Interval dislocation of the right hip prosthesis, with the femoral head component dislocated proximally, laterally, and anteriorly with respect to the acetabular shell. Bony demineralization noted.  I do not see a discrete fracture. IMPRESSION: 1. Recurrent right hip prosthesis dislocation. Electronically Signed   By: Van Clines M.D.   On: 01/19/2016 16:44   Procedures Procedures (including critical care time)  Medications Ordered in ED Medications   HYDROmorphone (DILAUDID) 1 MG/ML injection (not administered)  aspirin chewable tablet 81 mg ( Oral MAR Un[facility] 01/20/16 1913)  docusate sodium (COLACE) capsule 100 mg ( Oral MAR [place] 01/20/16 1913)  triamterene-hydrochlorothiazide (MAXZIDE-25) 37.5-25 MG per tablet 1 tablet ( Oral MAR [place] 01/20/16 1913)  fluticasone (FLONASE) 50 MCG/ACT nasal spray 2 spray ( Each Nare MAR [place] 01/20/16 1913)  HYDROcodone-acetaminophen (NORCO) 10-325 MG per tablet 1-2 tablet (1 tablet Oral Given 01/20/16 2038)  loratadine (CLARITIN) tablet 10 mg ( Oral MAR [place] 01/20/16 1913)  methocarbamol (ROBAXIN) tablet 500 mg ( Oral MAR [place]hold 01/20/16 1913)  atorvastatin (LIPITOR) tablet 20 mg ( Oral MAR Unhold 01/20/16 1913)  levothyroxine (SYNTHROID, LEVOTHROID) tablet 75 mcg ( Oral MAR Unhold 01/20/16 1913)  famotidine (PEPCID) tablet 40 mg ( Oral MAR Unhold 01/20/16 1913)  sertraline (ZOLOFT) tablet 150 mg ( Oral MAR Unhold 01/20/16 1913)  ALPRAZolam (XANAX) tablet 1 mg (1 mg Oral Given 01/20/16 1958)  pantoprazole (PROTONIX) EC tablet 40 mg ( Oral MAR Unhold 01/20/16 1913)  acetaminophen (TYLENOL) tablet 650 mg ( Oral MAR Unhold 01/20/16 1913)    Or  acetaminophen (TYLENOL) suppository 650 mg ( Rectal MAR Unhold 01/20/16 1913)  dextrose 5 % and 0.45 % NaCl with KCl 20 mEq/L infusion (50 mL/hr Intravenous New Bag/Given 01/20/16 0146)  morphine 2 MG/ML injection 2 mg (2 mg Intravenous Given 01/20/16 1928)  nicotine (NICODERM CQ - dosed in mg/24 hours) patch 21 mg ( Transdermal MAR Un[facility] 01/20/16 1913)  potassium chloride (K-DUR,KLOR-CON) CR tablet 10 mEq (  Oral MAR Unhold 01/20/16 1913)  lactated ringers infusion ( Intravenous Duplicate 24/46/95 0722)  fentaNYL (SUBLIMAZE) injection 25-50 mcg (not administered)  meperidine (DEMEROL) injection 6.25-12.5 mg (not administered)  metoCLOPramide (REGLAN) injection 10 mg (not administered)  methocarbamol (ROBAXIN) 500 mg in dextrose 5 % 50 mL IVPB ( Intravenous  MAR Unhold 01/20/16 1913)  ondansetron (ZOFRAN) tablet 4 mg (not administered)    Or  ondansetron (ZOFRAN) injection 4 mg (not administered)  metoCLOPramide (REGLAN) tablet 5-10 mg (not administered)    Or  metoCLOPramide (REGLAN) injection 5-10 mg (not administered)  fentaNYL (SUBLIMAZE) injection 100 mcg (100 mcg Intravenous Given 01/19/16 1544)  fentaNYL (SUBLIMAZE) injection 100 mcg (100 mcg Intravenous Given 01/19/16 1840)  fentaNYL (SUBLIMAZE) injection 100 mcg (100 mcg Intravenous Given 01/19/16 2033)  propofol (DIPRIVAN) 10 mg/mL bolus/IV push 47 mg (47 mg Intravenous Given 01/19/16 2044)  0.9 %  sodium chloride infusion ( Intravenous Stopped 01/20/16 0010)  propofol (DIPRIVAN) 10 mg/mL bolus/IV push (47 mg Intravenous Given 01/19/16 2059)   Initial Impression / Assessment and Plan / ED Course  I have reviewed the triage vital signs and the nursing notes.  Pertinent labs & imaging results that were available during my care of the patient were reviewed by me and considered in my medical decision making (see chart for details).  Clinical Course    59 yo pmh with h/o R THA and revisions x 2, most recent revision on 01/02/16 by Dr. Alvan Dame [facility]) who presents to ED with R hip dislocation.  Hip x-ray confirms head of prosthesis is dislocated proximal, lateral and anterior to acetabulum.  Attempted hip reduction with assist of Dr. Leonette Monarch in ED. Reduction attempt unsuccessfully in ED. Pt tolerated procedure well without complications.  Unable to confirm failed reduction attempt as pt was taken to OR prior to x-ray, however R leg was still shortened and externally rotated after reduction attempt. R elbow x-ray shows mildly displaced radial head fracture with some comminution and small elbow effusion.  Pt declined sling while in ED.  Ortho surgeon consulted who will take patient to OR tonight for closed hip reduction.  Discussed dispo plan with patient and pt's husband who are  agreeable to plan.   CT elbow ordered per ortho request.   Final Clinical Impressions(s) / ED Diagnoses   Final diagnoses:  Right hip pain  Dislocation of right hip, initial encounter ([place])  Closed displaced fracture of head of right radius, initial encounter    New Prescriptions Discharge Medication List as of 01/20/2016  8:31 PM       Kinnie Feil, PA-C 01/20/16 2322

## 2016-01-19 NOTE — Op Note (Signed)
NAMEJAVARIA, KNAPKE NO.:  [id number]  MEDICAL RECORD NO.:  [id number]  LOCATION:                                 FACILITY:  PHYSICIAN:  Wylene Simmer, MD        DATE OF BIRTH:  [birth year]  DATE OF PROCEDURE:  01/19/2016 DATE OF DISCHARGE:                              OPERATIVE REPORT   PREOPERATIVE DIAGNOSIS:  Right anterior hip periprosthetic dislocation.  POSTOPERATIVE DIAGNOSIS:  Right anterior hip periprosthetic dislocation.  PROCEDURE:  Closed reduction of right hip dislocation under anesthesia (failed).  SURGEON:  Wylene Simmer, MD.  ANESTHESIA:  General.  ESTIMATED BLOOD LOSS:  None.  TOURNIQUET:  None.  COMPLICATIONS:  None apparent.  DISPOSITION:  Extubated, awake, and stable to recovery.  INDICATION FOR PROCEDURE:  The patient is a 60 year old woman, who has a history of right revision hip replacement approximately 2 weeks ago. She sustained a posterior hip dislocation about 10 days ago.  This was reduced in the operating room by Dr. Tonita Cong.  She was standing in her kitchen today and twisted and felt a pop in her hip.  She fell and also noted pain in the right elbow.  She was seen in the emergency department where radiographs show a dislocation of the right hip joint as well as a radial head fracture.  She presents now for closed reduction of the right hip under anesthesia.  She understands the risks of recurrent dislocation as well as nerve damage.  PROCEDURE IN DETAIL:  After preoperative consent was obtained and the correct operative site was identified, the patient was brought to the operating room and placed supine on the operating table.  General anesthesia was induced and a surgical time-out was taken.  The right lower extremity was then reduced with longitudinal traction, external rotation followed by internal rotation.  There was a palpable clunk.  An AP radiograph showed that the hip was still dislocated.  Several more attempts  were made with the patient still under general anesthesia and these were all unsuccessful.  The procedure was then abandoned.  The patient was awakened by Anesthesia and transported to the recovery room in stable condition.  FOLLOWUP PLAN:  The patient will be admitted.  She will be n.p.o. after midnight for potential open reduction in the operating room tomorrow afternoon.  We will hold blood thinners pending surgery tomorrow.     Wylene Simmer, MD     JH/MEDQ  D:  01/19/2016  T:  01/19/2016  Job:  [id number]

## 2016-01-19 NOTE — H&P (Signed)
[name] is an 60 y.o. female.   Chief Complaint: right hip and right elbow pain HPI: 60 y/o female with PMH of R THA 2 weeks ago twisted in her kitchen earlier today and fell on her right side.  She came to the ED via EMS.  She c/o pain in the right elbow and right hip.  She dislocated the right hip about 10 days ago and underwent closed reduction by Dr. Tonita Cong in the [facility].  She denies any h/o injury to the right elbow.  She is not diabetic.  Closed reduction in the ER was unsuccessful.    Past Medical History:  Diagnosis Date  . Allergic rhinitis   . Anemia   . Anxiety   . Chronic back pain   . COPD (chronic obstructive pulmonary disease) ([place])   . Depression   . Essential hypertension   . GERD (gastroesophageal reflux disease)   . Headache    migraines  . History of gastritis    EGD 2015  . History of home oxygen therapy    2 liters at hs last 6 months  . Hypothyroidism   . Osteoarthritis    oa  . Scoliosis     Past Surgical History:  Procedure Laterality Date  . APPENDECTOMY     1985  . CARDIAC CATHETERIZATION N/A 10/31/2015   Procedure: Left Heart Cath and Coronary Angiography;  Surgeon: Leonie Man, MD;  Location: [facility];  Service: Cardiovascular;  Laterality: N/A;  . CARPAL TUNNEL RELEASE Left   . CARPAL TUNNEL RELEASE Right   . CHOLECYSTECTOMY  late 1980's  . HIP CLOSED REDUCTION Right 01/08/2016   Procedure: CLOSED MANIPULATION HIP;  Surgeon: Susa Day, MD;  Location: [facility];  Service: Orthopedics;  Laterality: Right;  . TONSILLECTOMY    . TOTAL ABDOMINAL HYSTERECTOMY     1985, with 1 ovary removed and 2 nd ovary removed 2003  . TOTAL HIP ARTHROPLASTY Right    Original surgery 2006 with revision 2010  . TOTAL HIP REVISION Right 01/01/2016   Procedure: TOTAL HIP REVISION;  Surgeon: Paralee Cancel, MD;  Location: [facility];  Service: Orthopedics;  Laterality: Right;  . ULNAR NERVE TRANSPOSITION Right     Family History  Problem Relation Age of  Onset  . COPD Mother   . Lung disease Father     Asbestosis  . Heart attack Father   . Cerebral aneurysm Brother    Social History:  reports that she has been smoking Cigarettes.  She has a 20.00 pack-year smoking history. She has never used smokeless tobacco. She reports that she does not drink alcohol or use drugs.  Allergies:  Allergies  Allergen Reactions  . Aleve [Naproxen Sodium] Other (See Comments)    Headache   . Codeine     REACTION: GI upset  . Penicillins Other (See Comments)    GI upset Has patient had a PCN reaction causing immediate rash, facial/tongue/throat swelling, SOB or lightheadedness with hypotension: No Has patient had a PCN reaction causing severe rash involving mucus membranes or skin necrosis: No Has patient had a PCN reaction that required hospitalization No Has patient had a PCN reaction occurring within the last 10 years: No If all of the above answers are "NO", then may proceed with Cephalosporin use.   . Sulfonamide Derivatives Hives     (Not in a hospital admission)  Results for orders placed or performed during the hospital encounter of 01/19/16 (from the past 48 hour(s))  Troponin I     Status: None   Collection Time: 01/19/16  3:27 PM  Result Value Ref Range   Troponin I <0.03 <0.03 ng/mL  Brain natriuretic peptide     Status: None   Collection Time: 01/19/16  3:27 PM  Result Value Ref Range   B Natriuretic Peptide 22.4 0.0 - 100.0 pg/mL  CBC with Differential     Status: Abnormal   Collection Time: 01/19/16  3:27 PM  Result Value Ref Range   WBC 7.7 4.0 - 10.5 K/uL   RBC 3.82 (L) 3.87 - 5.11 MIL/uL   Hemoglobin 9.9 (L) 12.0 - 15.0 g/dL   HCT 32.0 (L) 36.0 - 46.0 %   MCV 83.8 78.0 - 100.0 fL   MCH 25.9 (L) 26.0 - 34.0 pg   MCHC 30.9 30.0 - 36.0 g/dL   RDW 17.0 (H) 11.5 - 15.5 %   Platelets 337 150 - 400 K/uL   Neutrophils Relative % 52 %   Neutro Abs 3.9 1.7 - 7.7 K/uL   Lymphocytes Relative 31 %   Lymphs Abs 2.4 0.7 - 4.0  K/uL   Monocytes Relative 10 %   Monocytes Absolute 0.8 0.1 - 1.0 K/uL   Eosinophils Relative 7 %   Eosinophils Absolute 0.6 0.0 - 0.7 K/uL   Basophils Relative 0 %   Basophils Absolute 0.0 0.0 - 0.1 K/uL  Basic metabolic panel     Status: Abnormal   Collection Time: 01/19/16  3:27 PM  Result Value Ref Range   Sodium 141 135 - 145 mmol/L   Potassium 3.8 3.5 - 5.1 mmol/L   Chloride 110 101 - 111 mmol/L   CO2 23 22 - 32 mmol/L   Glucose, Bld 91 65 - 99 mg/dL   BUN 19 6 - 20 mg/dL   Creatinine, Ser 0.84 0.44 - 1.00 mg/dL   Calcium 8.2 (L) 8.9 - 10.3 mg/dL   GFR calc non Af Amer >60 >60 mL/min   GFR calc Af Amer >60 >60 mL/min    Comment: (NOTE) The eGFR has been calculated using the CKD EPI equation. This calculation has not been validated in all clinical situations. eGFR's persistently <60 mL/min signify possible Chronic Kidney Disease.    Anion gap 8 5 - 15   Dg Chest 1 View  Result Date: 01/19/2016 CLINICAL DATA:  Fall at home today, shoulder pain.  COPD. EXAM: CHEST 1 VIEW COMPARISON:  01/08/2016 FINDINGS: Heart size within normal limits for the supine AP projection. Reverse lordotic projection. The lungs appear clear. No pleural effusion identified. IMPRESSION: 1.  No significant abnormality identified. Electronically Signed   By: Van Clines M.D.   On: 01/19/2016 16:35   Dg Shoulder Right  Result Date: 01/19/2016 CLINICAL DATA:  Pain in the right shoulder after fall today. EXAM: RIGHT SHOULDER - 2+ VIEW COMPARISON:  None. FINDINGS: There is no evidence of fracture or dislocation. Mild spurring at the [facility] joint. No acute abnormalities observed. There is evidence of cervical spondylosis on the frontal projection. IMPRESSION: 1. Mild degenerative AC joint spurring, without acute radiographic findings. No dislocation or fracture seen. Electronically Signed   By: Van Clines M.D.   On: 01/19/2016 16:39   Dg Elbow Complete Right  Result Date: 01/19/2016 CLINICAL  DATA:  Fall, elbow pain. EXAM: RIGHT ELBOW - COMPLETE 3+ VIEW COMPARISON:  None. FINDINGS: Mildly displaced acute fracture of the radial head, with associated small elbow joint effusion (anterior sail sign) and volar bulging of the supinator  fat pad. I do not observe any other fractures. There is some mild spurring along the coronoid process of the ulna, and the lateral epicondyle. IMPRESSION: 1. Mildly displaced radial head fracture, potentially with some mild comminution. This is accompanied by anterior bowing of the supinator fat pad and a small elbow joint effusion. Electronically Signed   By: Van Clines M.D.   On: 01/19/2016 16:42   Dg Forearm Right  Result Date: 01/19/2016 CLINICAL DATA:  Recent fall with known proximal radial fracture EXAM: RIGHT FOREARM - 2 VIEW COMPARISON:  CT from earlier in the same day. FINDINGS: Radial head fracture with impaction is again identified and stable from the prior study. Elevation the anterior fat pad is noted. No other focal abnormality is seen. IMPRESSION: Radial head fracture similar to that seen on prior CT. Electronically Signed   By: Inez Catalina M.D.   On: 01/19/2016 20:35   Dg Wrist Complete Right  Result Date: 01/19/2016 CLINICAL DATA:  RIGHT wrist and elbow pain after falling today, fell onto RIGHT side and caught her fall with an outstretched hand EXAM: RIGHT WRIST - COMPLETE 3+ VIEW COMPARISON:  None FINDINGS: Osseous demineralization. Joint space narrowing at first MCP joint. Joint spaces otherwise preserved. No acute fracture, dislocation, or bone destruction. IMPRESSION: No acute osseous abnormalities. Degenerative changes RIGHT first MCP joint. Electronically Signed   By: Lavonia Dana M.D.   On: 01/19/2016 20:37   Ct Elbow Right Wo Contrast  Result Date: 01/19/2016 CLINICAL DATA:  Radial head fracture due to fall today EXAM: CT OF THE LOWER RIGHT EXTREMITY WITHOUT CONTRAST TECHNIQUE: Multidetector CT imaging of the right lower extremity  was performed according to the standard protocol. COMPARISON:  Radiographs 01/19/2016 FINDINGS: Bones/Joint/Cartilage Comminuted intra-articular radial head fracture with approximately 3 mm depression of the lateral fracture fragment. Suspect tiny fracture off the coronoid process of the ulna, series 604, image number 21. Suspect osteochondral injury of the capitulum with tiny 1-2 mm fracture fragment/loose body, series 602, image number 37. No dislocation is evident. Large elbow effusion is present. Ligaments Suboptimally assessed by CT. Muscles and Tendons Normal bulk and no fatty atrophy. Triceps tendon appears grossly intact. Soft tissues Moderate edema and soft tissue swelling around the proximal radius. IMPRESSION: 1. Comminuted intra-articular radial head fracture with approximately 3 mm depression of the lateral fracture fragment 2. Suspect tiny coronoid process fracture of the ulna 3. Suspect osteochondral injury of the capitulum with tiny 1-2 mm fracture fragment/loose body within the joint space. Electronically Signed   By: Donavan Foil M.D.   On: 01/19/2016 19:55   Dg Hip Unilat With Pelvis 2-3 Views Right  Result Date: 01/19/2016 CLINICAL DATA:  Pain in right hip after a fall today. EXAM: DG HIP (WITH OR WITHOUT PELVIS) 2-3V RIGHT COMPARISON:  None. FINDINGS: Interval dislocation of the right hip prosthesis, with the femoral head component dislocated proximally, laterally, and anteriorly with respect to the acetabular shell. Bony demineralization noted.  I do not see a discrete fracture. IMPRESSION: 1. Recurrent right hip prosthesis dislocation. Electronically Signed   By: Van Clines M.D.   On: 01/19/2016 16:44    ROS  No recent f/c/n/v/wt loss  Blood pressure 117/59, pulse 71, temperature 98.4 F (36.9 C), temperature source Oral, resp. rate 10, SpO2 100 %. Physical Exam  wn wd woman in nad .  A and O x 4.  Mood and affect normal.  EOMI.  resp unlabored.  R LE shortened and  externally rotated.  Skin  healthy and intact.  Pulses palpable at the foot.  5/5 strength in PF and DF of the ankle.  Sens to LT intact at the foot dorsally and plantarly.  R elbow TTP at the radial head.  Slight swelling.  No lymphadenopathy.  Intact sens to LT at the radial, median and ulnar n dist.  5/5 sterngth in radial, ulnar and median n dist.  Assessment/Plan R elbow pain - sling to R UE.  F/u with Dr. Caralyn Guile next week.  R hip dislocation - to OR for R hip closed reduction under anesth.    The risks and benefits of the alternative treatment options have been discussed in detail.  The patient wishes to proceed with surgery and specifically understands risks of bleeding, infection, nerve damage, blood clots, need for additional surgery, amputation and death.   Wylene Simmer, MD February 06, 2016, 10:04 PM

## 2016-01-19 NOTE — ED Notes (Signed)
Patient transported to CT 

## 2016-01-19 NOTE — ED Notes (Signed)
RN drawing labs 

## 2016-01-19 NOTE — ED Triage Notes (Signed)
Per EMS pt from home with c/o hip pain/injury. Per EMS pt had a recent hip surgery, today she twisted and felt her hip "pop-out. Pt fell and was on the floor for over an hours before she was found. Per EMS there is obvious leg shortening. Pt had 10 mg of morphine en route

## 2016-01-19 NOTE — Anesthesia Preprocedure Evaluation (Addendum)
Anesthesia Evaluation  Patient identified by MRN, date of birth, ID band Patient awake    Reviewed: Allergy & Precautions, NPO status , Patient's Chart, lab work & pertinent test results  History of Anesthesia Complications Negative for: history of anesthetic complications  Airway Mallampati: II  TM Distance: >3 FB Neck ROM: Full    Dental  (+) Dental Advisory Given   Pulmonary COPD,  oxygen dependent, Current Smoker,    breath sounds clear to auscultation       Cardiovascular hypertension, Pt. on medications (-) angina Rhythm:Regular Rate:Normal  10/17 cath: L ventricular systolic function is normal. LV EDP normal. Trivial (1+) mitral regurgitation. Angiographically normal coronary arteries     Neuro/Psych Anxiety Depression negative neurological ROS     GI/Hepatic Neg liver ROS, GERD  Medicated and Controlled,  Endo/Other  Hypothyroidism Morbid obesity  Renal/GU negative Renal ROS     Musculoskeletal  (+) Arthritis , Osteoarthritis,    Abdominal (+) + obese,   Peds  Hematology  (+) Blood dyscrasia (Hb 9.9), anemia ,   Anesthesia Other Findings   Reproductive/Obstetrics                            Anesthesia Physical Anesthesia Plan  ASA: III  Anesthesia Plan: General   Post-op Pain Management:    Induction: Intravenous  Airway Management Planned: Mask  Additional Equipment:   Intra-op Plan:   Post-operative Plan:   Informed Consent: I have reviewed the patients History and Physical, chart, labs and discussed the procedure including the risks, benefits and alternatives for the proposed anesthesia with the patient or authorized representative who has indicated his/her understanding and acceptance.   Dental advisory given  Plan Discussed with: CRNA and Surgeon  Anesthesia Plan Comments: (Plan routine monitors, GA)        Anesthesia Quick Evaluation

## 2016-01-20 ENCOUNTER — Inpatient Hospital Stay (HOSPITAL_COMMUNITY): Payer: BLUE CROSS/BLUE SHIELD | Admitting: Registered Nurse

## 2016-01-20 ENCOUNTER — Inpatient Hospital Stay (HOSPITAL_COMMUNITY): Payer: BLUE CROSS/BLUE SHIELD

## 2016-01-20 ENCOUNTER — Encounter (HOSPITAL_COMMUNITY)
Admission: EM | Disposition: A | Discharge status: Home / Self Care | Payer: Self-pay | Source: Home / Self Care | Attending: Orthopedic Surgery

## 2016-01-20 HISTORY — PX: HIP CLOSED REDUCTION: SHX983

## 2016-01-20 LAB — SURGICAL PCR SCREEN
MRSA, PCR: INVALID — AB
STAPHYLOCOCCUS AUREUS: INVALID — AB

## 2016-01-20 SURGERY — CLOSED MANIPULATION, JOINT, HIP
Anesthesia: General | Site: Hip | Laterality: Right

## 2016-01-20 MED ORDER — METOCLOPRAMIDE HCL 5 MG/ML IJ SOLN: 5.0000 mg | Freq: Three times a day (TID) | INTRAMUSCULAR | Status: DC | PRN

## 2016-01-20 MED ORDER — ACETAMINOPHEN 325 MG PO TABS: 650.0000 mg | ORAL_TABLET | Freq: Four times a day (QID) | ORAL | Status: DC | PRN

## 2016-01-20 MED ORDER — ONDANSETRON HCL 4 MG/2ML IJ SOLN: 4.0000 mg | Freq: Four times a day (QID) | INTRAMUSCULAR | Status: DC | PRN

## 2016-01-20 MED ORDER — ONDANSETRON HCL 4 MG/2ML IJ SOLN
4.0000 mg | Freq: Three times a day (TID) | INTRAMUSCULAR | Status: DC | PRN
  Administered 2016-01-20: 12:00:00 4 mg via INTRAVENOUS
  Filled 2016-01-20: qty 2

## 2016-01-20 MED ORDER — ONDANSETRON HCL 4 MG/2ML IJ SOLN: 4.0000 mg | Freq: Three times a day (TID) | INTRAMUSCULAR | Status: DC

## 2016-01-20 MED ORDER — PROPOFOL 10 MG/ML IV BOLUS
INTRAVENOUS | Status: DC | PRN
  Administered 2016-01-20: 130 mg via INTRAVENOUS

## 2016-01-20 MED ORDER — LACTATED RINGERS IV SOLN
INTRAVENOUS | Status: DC | PRN
  Administered 2016-01-20: 17:00:00 via INTRAVENOUS

## 2016-01-20 MED ORDER — ONDANSETRON HCL 4 MG/2ML IJ SOLN
INTRAMUSCULAR | Status: DC | PRN
  Administered 2016-01-20: 4 mg via INTRAVENOUS

## 2016-01-20 MED ORDER — LIDOCAINE HCL (CARDIAC) 20 MG/ML IV SOLN
INTRAVENOUS | Status: DC | PRN
  Administered 2016-01-20: 100 mg via INTRAVENOUS

## 2016-01-20 MED ORDER — ONDANSETRON HCL 4 MG/2ML IJ SOLN
INTRAMUSCULAR | Status: AC
  Filled 2016-01-20: qty 2

## 2016-01-20 MED ORDER — MEPERIDINE HCL 25 MG/ML IJ SOLN: 6.2500 mg | INTRAMUSCULAR | Status: DC | PRN

## 2016-01-20 MED ORDER — LACTATED RINGERS IV SOLN: INTRAVENOUS | Status: DC

## 2016-01-20 MED ORDER — FENTANYL CITRATE (PF) 100 MCG/2ML IJ SOLN
INTRAMUSCULAR | Status: DC | PRN
  Administered 2016-01-20: 100 ug via INTRAVENOUS

## 2016-01-20 MED ORDER — METOCLOPRAMIDE HCL 5 MG/ML IJ SOLN: 10.0000 mg | Freq: Once | INTRAMUSCULAR | Status: DC | PRN

## 2016-01-20 MED ORDER — METHOCARBAMOL 1000 MG/10ML IJ SOLN
500.0000 mg | Freq: Four times a day (QID) | INTRAVENOUS | Status: DC | PRN
  Filled 2016-01-20: qty 5

## 2016-01-20 MED ORDER — PROPOFOL 10 MG/ML IV BOLUS
INTRAVENOUS | Status: AC
  Filled 2016-01-20: qty 20

## 2016-01-20 MED ORDER — FENTANYL CITRATE (PF) 100 MCG/2ML IJ SOLN: 25.0000 ug | INTRAMUSCULAR | Status: DC | PRN

## 2016-01-20 MED ORDER — ONDANSETRON HCL 4 MG PO TABS: 4.0000 mg | ORAL_TABLET | Freq: Four times a day (QID) | ORAL | Status: DC | PRN

## 2016-01-20 MED ORDER — FENTANYL CITRATE (PF) 100 MCG/2ML IJ SOLN
INTRAMUSCULAR | Status: AC
  Filled 2016-01-20: qty 2

## 2016-01-20 MED ORDER — SUCCINYLCHOLINE CHLORIDE 200 MG/10ML IV SOSY
PREFILLED_SYRINGE | INTRAVENOUS | Status: DC | PRN
  Administered 2016-01-20: 100 mg via INTRAVENOUS

## 2016-01-20 MED ORDER — ACETAMINOPHEN 650 MG RE SUPP: 650.0000 mg | Freq: Four times a day (QID) | RECTAL | Status: DC | PRN

## 2016-01-20 MED ORDER — METOCLOPRAMIDE HCL 5 MG PO TABS
5.0000 mg | ORAL_TABLET | Freq: Three times a day (TID) | ORAL | Status: DC | PRN
Start: 2016-01-20 — End: 2016-01-21

## 2016-01-20 MED ORDER — DIAZEPAM 5 MG/ML IJ SOLN: 5.0000 mg | Freq: Three times a day (TID) | INTRAMUSCULAR | Status: DC | PRN

## 2016-01-20 SURGICAL SUPPLY — 19 items
BANDAGE ADH SHEER 1  50/CT (GAUZE/BANDAGES/DRESSINGS) IMPLANT
DURAPREP 26ML APPLICATOR (WOUND CARE) IMPLANT
GAUZE SPONGE 4X4 12PLY STRL (GAUZE/BANDAGES/DRESSINGS) IMPLANT
GLOVE BIOGEL M 7.0 STRL (GLOVE) IMPLANT
GLOVE BIOGEL PI IND STRL 7.5 (GLOVE) IMPLANT
GLOVE BIOGEL PI IND STRL 8.5 (GLOVE) IMPLANT
GLOVE BIOGEL PI INDICATOR 7.5 (GLOVE)
GLOVE BIOGEL PI INDICATOR 8.5 (GLOVE)
GLOVE ECLIPSE 8.0 STRL XLNG CF (GLOVE) IMPLANT
GLOVE ORTHO TXT STRL SZ7.5 (GLOVE) IMPLANT
GLOVE SURG ORTHO 8.0 STRL STRW (GLOVE) IMPLANT
GOWN STRL REUS W/TWL LRG LVL3 (GOWN DISPOSABLE) IMPLANT
GOWN STRL REUS W/TWL XL LVL3 (GOWN DISPOSABLE) IMPLANT
MANIFOLD NEPTUNE II (INSTRUMENTS) IMPLANT
NDL SAFETY ECLIPSE 18X1.5 (NEEDLE) IMPLANT
NEEDLE HYPO 18GX1.5 SHARP (NEEDLE)
POSITIONER SURGICAL ARM (MISCELLANEOUS) IMPLANT
SYR CONTROL 10ML LL (SYRINGE) IMPLANT
TOWEL OR 17X26 10 PK STRL BLUE (TOWEL DISPOSABLE) IMPLANT

## 2016-01-20 NOTE — H&P (View-Only) (Signed)
Subjective: Hip pain minor   Objective: Vital signs in last 24 hours: Temp:  [98.2 F (36.8 C)-98.5 F (36.9 C)] 98.5 F (36.9 C) (12/22 2330) Pulse Rate:  [69-90] 79 (12/22 2330) Resp:  [10-18] 18 (12/22 2315) BP: (96-148)/(59-109) 137/69 (12/22 2330) SpO2:  [98 %-100 %] 98 % (12/22 2330)  Intake/Output from previous day: 12/22 0701 - 12/23 0700 In: 861.7 [I.V.:861.7] Out: 700 [Urine:700] Intake/Output this shift: No intake/output data recorded.   Recent Labs  01/19/16 1527  HGB 9.9*    Recent Labs  01/19/16 1527  WBC 7.7  RBC 3.82*  HCT 32.0*  PLT 337    Recent Labs  01/19/16 1527  NA 141  K 3.8  CL 110  CO2 23  BUN 19  CREATININE 0.84  GLUCOSE 91  CALCIUM 8.2*   No results for input(s): LABPT, INR in the last 72 hours.  Neurologically intact ABD soft Sensation intact distally Intact pulses distally Dorsiflexion/Plantar flexion intact Compartment soft  Assessment/Plan: Plan open reduction by Dr. Alvan Dame NPO   [name] C 01/20/2016, 8:09 AM

## 2016-01-20 NOTE — Anesthesia Postprocedure Evaluation (Signed)
Anesthesia Post Note  Patient: [name]  Procedure(s) Performed: Procedure(s) (LRB): CLOSED REDUCTION RIGHT TOTAL HIP (Right)  Patient location during evaluation: PACU Anesthesia Type: General Level of consciousness: awake and alert Pain management: pain level controlled Vital Signs Assessment: post-procedure vital signs reviewed and stable Respiratory status: spontaneous breathing, nonlabored ventilation, respiratory function stable and patient connected to nasal cannula oxygen Cardiovascular status: blood pressure returned to baseline and stable Postop Assessment: no signs of nausea or vomiting Anesthetic complications: no       Last Vitals:  Vitals:   01/20/16 1844 01/20/16 2000  BP: (!) 117/58 115/61  Pulse: 83 95  Resp: 14 16  Temp:  36.9 C    Last Pain:  Vitals:   01/20/16 2037  TempSrc:   PainSc: 6                  Montez Hageman

## 2016-01-20 NOTE — Interval H&P Note (Signed)
History and Physical Interval Note:  01/20/2016 5:13 PM  [name]  has presented today for surgery, with the diagnosis of dislocated right total hip  The various methods of treatment have been discussed with the patient and family. After consideration of risks, benefits and other options for treatment, the patient has consented to  Procedure(s): CLOSED MANIPULATION HIP VS OPEN REDUCTION INTERNAL FIXATION (Right) as a surgical intervention .  The patient's history has been reviewed, patient examined, no change in status, stable for surgery.  I have reviewed the patient's chart and labs.  Questions were answered to the patient's satisfaction.     Mauri Pole

## 2016-01-20 NOTE — Op Note (Signed)
[name], [name] NO.:  [id number]  MEDICAL RECORD NO.:  [id number]  LOCATION:                                 FACILITY:  PHYSICIAN:  Pietro Cassis. Alvan Dame, M.D.  DATE OF BIRTH:  1955/11/21  DATE OF PROCEDURE:  01/20/2016 DATE OF DISCHARGE:                              OPERATIVE REPORT   PREOPERATIVE DIAGNOSIS:  Dislocated right total hip with failure of initial attempted closed reduction and anesthesia.  POSTOPERATIVE DIAGNOSIS:  Dislocated right total hip with failure of initial attempted closed reduction and anesthesia.  PROCEDURE:  Closed reduction of right total hip under anesthesia.  SURGEON:  Pietro Cassis. Alvan Dame, M.D.  ASSISTANT:  Surgical team.  ANESTHESIA:  General plus MAC anesthetic.  COMPLICATION:  None.  INDICATIONS FOR PROCEDURE:  [name] is a 60 year old female who has undergone a revision of her right total hip for a metal on metal component revised to a ceramic on polyethylene insert.  She has unfortunately just 3 weeks out from her surgery and has had 2 different dislocations.  Already we have discussed this with her and the increased risk of dislocation in these isolated type procedures.  Unfortunately, yesterday she had an incident that occurred in her kitchen resulting in a recurrent dislocation.  My partner, Dr. Doran Durand had graciously to try to reduce her yesterday, but was unable to.  She was admitted for observation for me to attempt a closed reduction versus open reduction. Risks, benefits of both procedures were discussed and reviewed prior to surgery.  Consent was obtained.  PROCEDURE IN DETAIL:  The patient was brought to the operative theater. Once adequate anesthesia was established, time-out was performed identifying the planned procedure and extremity.  Once adequate paralytic anesthetic was established, I was able to manually closed reduced the hip.  I was able to feel it reduced and we confirmed this with a portable  x-ray.  Her hip range of motion in the operating room was very fluid and normal.  I did replace her knee immobilizer and she was awoken from her anesthesia.  Findings reviewed with the family.  Plan will be for her to be able to be discharged home this evening due to the holiday.  I reviewed the precautions with her as well as with the family.     Pietro Cassis Alvan Dame, M.D.     MDO/MEDQ  D:  01/20/2016  T:  01/20/2016  Job:  [id number]

## 2016-01-20 NOTE — Anesthesia Preprocedure Evaluation (Addendum)
Anesthesia Evaluation  Patient identified by MRN, date of birth, ID band Patient awake    Reviewed: Allergy & Precautions, NPO status , Patient's Chart, lab work & pertinent test results  History of Anesthesia Complications Negative for: history of anesthetic complications  Airway Mallampati: II  TM Distance: >3 FB Neck ROM: Full    Dental  (+) Dental Advisory Given   Pulmonary COPD,  oxygen dependent, Current Smoker,    breath sounds clear to auscultation       Cardiovascular hypertension, Pt. on medications (-) angina Rhythm:Regular Rate:Normal  10/17 cath: L ventricular systolic function is normal. LV EDP normal. Trivial (1+) mitral regurgitation. Angiographically normal coronary arteries     Neuro/Psych Anxiety Depression negative neurological ROS     GI/Hepatic Neg liver ROS, GERD  Medicated and Controlled,  Endo/Other  Hypothyroidism Morbid obesity  Renal/GU negative Renal ROS     Musculoskeletal  (+) Arthritis , Osteoarthritis,    Abdominal (+) + obese,   Peds  Hematology  (+) Blood dyscrasia (Hb 9.9), anemia ,   Anesthesia Other Findings   Reproductive/Obstetrics                             Anesthesia Physical  Anesthesia Plan  ASA: III  Anesthesia Plan: General   Post-op Pain Management:    Induction: Intravenous  Airway Management Planned: Mask  Additional Equipment:   Intra-op Plan:   Post-operative Plan:   Informed Consent: I have reviewed the patients History and Physical, chart, labs and discussed the procedure including the risks, benefits and alternatives for the proposed anesthesia with the patient or authorized representative who has indicated his/her understanding and acceptance.   Dental advisory given  Plan Discussed with: CRNA and Surgeon  Anesthesia Plan Comments: (Plan routine monitors, GA)        Anesthesia Quick Evaluation

## 2016-01-20 NOTE — Progress Notes (Signed)
Subjective: Hip pain minor   Objective: Vital signs in last 24 hours: Temp:  [98.2 F (36.8 C)-98.5 F (36.9 C)] 98.5 F (36.9 C) (12/22 2330) Pulse Rate:  [69-90] 79 (12/22 2330) Resp:  [10-18] 18 (12/22 2315) BP: (96-148)/(59-109) 137/69 (12/22 2330) SpO2:  [98 %-100 %] 98 % (12/22 2330)  Intake/Output from previous day: 12/22 0701 - 12/23 0700 In: 861.7 [I.V.:861.7] Out: 700 [Urine:700] Intake/Output this shift: No intake/output data recorded.   Recent Labs  01/19/16 1527  HGB 9.9*    Recent Labs  01/19/16 1527  WBC 7.7  RBC 3.82*  HCT 32.0*  PLT 337    Recent Labs  01/19/16 1527  NA 141  K 3.8  CL 110  CO2 23  BUN 19  CREATININE 0.84  GLUCOSE 91  CALCIUM 8.2*   No results for input(s): LABPT, INR in the last 72 hours.  Neurologically intact ABD soft Sensation intact distally Intact pulses distally Dorsiflexion/Plantar flexion intact Compartment soft  Assessment/Plan: Plan open reduction by Dr. Alvan Dame NPO   [name] C 01/20/2016, 8:09 AM

## 2016-01-20 NOTE — Progress Notes (Signed)
Pt dc via wheelchair accompanied by spouse and nurse tech. Pt stable.

## 2016-01-20 NOTE — Transfer of Care (Signed)
Immediate Anesthesia Transfer of Care Note  Patient: [name]  Procedure(s) Performed: Procedure(s): CLOSED REDUCTION RIGHT TOTAL HIP (Right)  Patient Location: PACU  Anesthesia Type:General  Level of Consciousness: awake, alert , oriented and patient cooperative  Airway & Oxygen Therapy: Patient Spontanous Breathing and Patient connected to face mask oxygen  Post-op Assessment: Report given to RN, Post -op Vital signs reviewed and stable and Patient moving all extremities  Post vital signs: Reviewed and stable  Last Vitals:  Vitals:   01/19/16 2330 01/20/16 1400  BP: 137/69 128/66  Pulse: 79 87  Resp:  15  Temp: 36.9 C 37 C    Last Pain:  Vitals:   01/20/16 1621  TempSrc:   PainSc: 6       Patients Stated Pain Goal: 3 (94/76/54 [id number])  Complications: No apparent anesthesia complications

## 2016-01-20 NOTE — Discharge Instructions (Signed)
Please wear knee immobilizer at all times other than showering until further directed

## 2016-01-20 NOTE — Brief Op Note (Signed)
01/19/2016 - 01/20/2016  6:00 PM  PATIENT:  [name]  60 y.o. female  PRE-OPERATIVE DIAGNOSIS:  dislocated right total hip  POST-OPERATIVE DIAGNOSIS:  dislocated right total hip  PROCEDURE:  Procedure(s): CLOSED REDUCTION RIGHT TOTAL HIP (Right)  SURGEON:  Surgeon(s) and Role:    * Paralee Cancel, MD - Primary  ANESTHESIA:   general and MAC  EBL:  Total I/O In: 450 [I.V.:450] Out: 3 [Urine:3]  BLOOD ADMINISTERED:none  DRAINS: none   LOCAL MEDICATIONS USED:  NONE  SPECIMEN:  No Specimen  DISPOSITION OF SPECIMEN:  N/A  COUNTS:  YES  TOURNIQUET:  * No tourniquets in log *  DICTATION: .Other Dictation: Dictation Number [id number]  PLAN OF CARE: Admit for overnight observation  PATIENT DISPOSITION:  PACU - hemodynamically stable.   Delay start of Pharmacological VTE agent (>24hrs) due to surgical blood loss or risk of bleeding: not applicable

## 2016-01-20 NOTE — Progress Notes (Signed)
Patient declines pneumonia vaccine.

## 2016-01-22 LAB — MRSA CULTURE: Culture: NOT DETECTED

## 2016-01-23 ENCOUNTER — Encounter (HOSPITAL_COMMUNITY): Payer: Self-pay | Admitting: Orthopedic Surgery

## 2016-01-25 NOTE — Discharge Summary (Signed)
Physician Discharge Summary  Patient ID: [name] MRN: [id number] DOB/AGE: 59/09/1955 59 y.o.  Admit date: 01/19/2016 Discharge date: 01/20/2016   Procedures:  Procedure(s) (LRB): CLOSED REDUCTION RIGHT TOTAL HIP (Right)  Attending Physician:  Dr. Paralee Cancel   Admission Diagnoses:   Dislocated right total hip arthroplasty  Discharge Diagnoses:  Active Problems:   Anterior dislocation of right hip [facility])  Past Medical History:  Diagnosis Date  . Allergic rhinitis   . Anemia   . Anxiety   . Chronic back pain   . COPD (chronic obstructive pulmonary disease) ([place])   . Depression   . Essential hypertension   . GERD (gastroesophageal reflux disease)   . Headache    migraines  . History of gastritis    EGD 2015  . History of home oxygen therapy    2 liters at hs last 6 months  . Hypothyroidism   . Osteoarthritis    oa  . Scoliosis     HPI:    59 y/o female with PMH of R THA 2 weeks ago.  She twisted in her kitchen earlier today and fell on her right side.  She came to the ED via EMS.  She c/o pain in the right elbow and right hip.  She dislocated the right hip about 10 days ago and underwent closed reduction by Dr. Tonita Cong in the [place].  She denies any h/o injury to the right elbow.  She is not diabetic.  Closed reduction in the ER was unsuccessful.    PCP: Deloria Lair, MD   Discharged Condition: good  Hospital Course:  Patient underwent the above stated procedure on 01/19/2016. Patient tolerated the procedure well and brought to the recovery room in good condition and subsequently to the floor.  POD #1 BP: 137/69 ; Pulse: 79 ; Temp: 98.5 F (36.9 C) ; Resp: 18 Hip pain minor.   Patient doing well after the hip was reduced today by Dr. Alvan Dame in the [place].  Dr. Doran Durand was unable to reduce the hip last night. Patient felt to be doing well  And ready to be discharged home.  LABS   No new labs   Discharge Exam: General appearance: alert, cooperative and no  distress Extremities: Homans sign is negative, no sign of DVT, no edema, redness or tenderness in the calves or thighs and no ulcers, gangrene or trophic changes  Disposition: Home with follow up in 2 weeks   [place] DEPT Follow up.   Specialty:  Emergency Medicine Why:  As needed, If symptoms worsen Contact information: [place] [phone] [place] D, MD Follow up.   Specialty:  Orthopedic Surgery Why:  Please call and make an appointment with Dr. Alvan Dame for next week Contact information: [address] [place] [address] [place] [address] [phone]        Follow up Follow up in 3 week(s).   Why:  for routine follow up          Discharge Instructions    Call MD / Call 911    Complete by:  As directed    If you experience chest pain or shortness of breath, CALL 911 and be transported to the hospital emergency room.  If you develope a fever above 101 F, pus (white drainage) or increased drainage or redness at the wound, or calf pain, call your surgeon's office.  Constipation Prevention    Complete by:  As directed    Drink plenty of fluids.  Prune juice may be helpful.  You may use a stool softener, such as Colace (over the counter) 100 mg twice a day.  Use MiraLax (over the counter) for constipation as needed.   Diet - low sodium heart healthy    Complete by:  As directed    Discharge instructions    Complete by:  As directed    INSTRUCTIONS AFTER JOINT REPLACEMENT   Remove items at home which could result in a fall. This includes throw rugs or furniture in walking pathways ICE to the affected joint every three hours while awake for 30 minutes at a time, for at least the first 3-5 days, and then as needed for pain and swelling.  Continue to use ice for pain and swelling. You may notice swelling that will progress down to the foot and  ankle.  This is normal after surgery.  Elevate your leg when you are not up walking on it.   Continue to use the breathing machine you got in the hospital (incentive spirometer) which will help keep your temperature down.  It is common for your temperature to cycle up and down following surgery, especially at night when you are not up moving around and exerting yourself.  The breathing machine keeps your lungs expanded and your temperature down.   DIET:  As you were doing prior to hospitalization, we recommend a well-balanced diet.  DRESSING / WOUND CARE / SHOWERING  Keep the surgical dressing until follow up.  The dressing is water proof, so you can shower without any extra covering.  IF THE DRESSING FALLS OFF or the wound gets wet inside, change the dressing with sterile gauze.  Please use good hand washing techniques before changing the dressing.  Do not use any lotions or creams on the incision until instructed by your surgeon.    ACTIVITY  Increase activity slowly as tolerated, but follow the weight bearing instructions below.   No driving for 6 weeks or until further direction given by your physician.  You cannot drive while taking narcotics.  No lifting or carrying greater than 10 lbs. until further directed by your surgeon. Avoid periods of inactivity such as sitting longer than an hour when not asleep. This helps prevent blood clots.  You may return to work once you are authorized by your doctor.     WEIGHT BEARING   Weight bearing as tolerated with assist device (walker, cane, etc) as directed, use it as long as suggested by your surgeon or therapist, typically at least 4-6 weeks.  Please wear knee immobilizer at all times   EXERCISES  Results after joint replacement surgery are often greatly improved when you follow the exercise, range of motion and muscle strengthening exercises prescribed by your doctor. Safety measures are also important to protect the joint from further  injury. Any time any of these exercises cause you to have increased pain or swelling, decrease what you are doing until you are comfortable again and then slowly increase them. If you have problems or questions, call your caregiver or physical therapist for advice.   Rehabilitation is important following a joint replacement. After just a few days of immobilization, the muscles of the leg can become weakened and shrink (atrophy).  These exercises are designed to build up the tone and strength of the thigh and leg muscles and to improve motion. Often times heat used for  twenty to thirty minutes before working out will loosen up your tissues and help with improving the range of motion but do not use heat for the first two weeks following surgery (sometimes heat can increase post-operative swelling).   These exercises can be done on a training (exercise) mat, on the floor, on a table or on a bed. Use whatever works the best and is most comfortable for you.    Use music or television while you are exercising so that the exercises are a pleasant break in your day. This will make your life better with the exercises acting as a break in your routine that you can look forward to.   Perform all exercises about fifteen times, three times per day or as directed.  You should exercise both the operative leg and the other leg as well.   Exercises include:   Quad Sets - Tighten up the muscle on the front of the thigh (Quad) and hold for 5-10 seconds.   Straight Leg Raises - With your knee straight (if you were given a brace, keep it on), lift the leg to 60 degrees, hold for 3 seconds, and slowly lower the leg.  Perform this exercise against resistance later as your leg gets stronger.  Leg Slides: Lying on your back, slowly slide your foot toward your buttocks, bending your knee up off the floor (only go as far as is comfortable). Then slowly slide your foot back down until your leg is flat on the floor again.  Angel  Wings: Lying on your back spread your legs to the side as far apart as you can without causing discomfort.  Hamstring Strength:  Lying on your back, push your heel against the floor with your leg straight by tightening up the muscles of your buttocks.  Repeat, but this time bend your knee to a comfortable angle, and push your heel against the floor.  You may put a pillow under the heel to make it more comfortable if necessary.   A rehabilitation program following joint replacement surgery can speed recovery and prevent re-injury in the future due to weakened muscles. Contact your doctor or a physical therapist for more information on knee rehabilitation.    CONSTIPATION  Constipation is defined medically as fewer than three stools per week and severe constipation as less than one stool per week.  Even if you have a regular bowel pattern at home, your normal regimen is likely to be disrupted due to multiple reasons following surgery.  Combination of anesthesia, postoperative narcotics, change in appetite and fluid intake all can affect your bowels.   YOU MUST use at least one of the following options; they are listed in order of increasing strength to get the job done.  They are all available over the counter, and you may need to use some, POSSIBLY even all of these options:    Drink plenty of fluids (prune juice may be helpful) and high fiber foods Colace 100 mg by mouth twice a day  Senokot for constipation as directed and as needed Dulcolax (bisacodyl), take with full glass of water  Miralax (polyethylene glycol) once or twice a day as needed.  If you have tried all these things and are unable to have a bowel movement in the first 3-4 days after surgery call either your surgeon or your primary doctor.    If you experience loose stools or diarrhea, hold the medications until you stool forms back up.  If your symptoms do not  get better within 1 week or if they get worse, check with your doctor.   If you experience "the worst abdominal pain ever" or develop nausea or vomiting, please contact the office immediately for further recommendations for treatment.   ITCHING:  If you experience itching with your medications, try taking only a single pain pill, or even half a pain pill at a time.  You can also use Benadryl over the counter for itching or also to help with sleep.   TED HOSE STOCKINGS:  Use stockings on both legs until for at least 2 weeks or as directed by physician office. They may be removed at night for sleeping.  MEDICATIONS:  See your medication summary on the "After Visit Summary" that nursing will review with you.  You may have some home medications which will be placed on hold until you complete the course of blood thinner medication.  It is important for you to complete the blood thinner medication as prescribed.  PRECAUTIONS:  If you experience chest pain or shortness of breath - call 911 immediately for transfer to the hospital emergency department.   If you develop a fever greater that 101 F, purulent drainage from wound, increased redness or drainage from wound, foul odor from the wound/dressing, or calf pain - CONTACT YOUR SURGEON.                                                   FOLLOW-UP APPOINTMENTS:  If you do not already have a post-op appointment, please call the office for an appointment to be seen by your surgeon.  Guidelines for how soon to be seen are listed in your "After Visit Summary", but are typically between 1-4 weeks after surgery.  OTHER INSTRUCTIONS:   Knee Replacement:  Do not place pillow under knee, focus on keeping the knee straight while resting. CPM instructions: 0-90 degrees, 2 hours in the morning, 2 hours in the afternoon, and 2 hours in the evening. Place foam block, curve side up under heel at all times except when in CPM or when walking.  DO NOT modify, tear, cut, or change the foam block in any way.  MAKE SURE YOU:  Understand these  instructions.  Get help right away if you are not doing well or get worse.    Thank you for letting us be a part of your medical care team.  It is a privilege we respect greatly.  We hope these instructions will help you stay on track for a fast and full recovery!   Increase activity slowly as tolerated    Complete by:  As directed       Allergies as of 01/20/2016      Reactions   Aleve [naproxen Sodium] Other (See Comments)   Headache   Codeine    REACTION: GI upset   Penicillins Other (See Comments)   GI upset Has patient had a PCN reaction causing immediate rash, facial/tongue/throat swelling, SOB or lightheadedness with hypotension: No Has patient had a PCN reaction causing severe rash involving mucus membranes or skin necrosis: No Has patient had a PCN reaction that required hospitalization No Has patient had a PCN reaction occurring within the last 10 years: No If all of the above answers are "NO", then may proceed with Cephalosporin use.   Sulfonamide Derivatives Hives  Medication List    TAKE these medications   ALPRAZolam 1 MG tablet Commonly known as:  XANAX Take 1 mg by mouth 4 (four) times daily.   aspirin 81 MG chewable tablet Chew 1 tablet (81 mg total) by mouth 2 (two) times daily. Take for 4 weeks.   atorvastatin 20 MG tablet Commonly known as:  LIPITOR Take 20 mg by mouth at bedtime.   docusate sodium 100 MG capsule Commonly known as:  COLACE Take 1 capsule (100 mg total) by mouth 2 (two) times daily. What changed:  when to take this  reasons to take this   ferrous sulfate 325 (65 FE) MG tablet Take 1 tablet (325 mg total) by mouth 3 (three) times daily after meals.   fluticasone 50 MCG/ACT nasal spray Commonly known as:  FLONASE Place 2 sprays into both nostrils daily.   HYDROcodone-acetaminophen 10-325 MG tablet Commonly known as:  NORCO Take 1-2 tablets by mouth every 4 (four) hours as needed. What changed:  reasons to take this     levothyroxine 75 MCG tablet Commonly known as:  SYNTHROID, LEVOTHROID Take 75 mcg by mouth daily before breakfast.   loratadine 10 MG tablet Commonly known as:  CLARITIN Take 10 mg by mouth daily.   methocarbamol 500 MG tablet Commonly known as:  ROBAXIN Take 1 tablet (500 mg total) by mouth every 6 (six) hours as needed for muscle spasms.   nitroGLYCERIN 0.4 MG SL tablet Commonly known as:  NITROSTAT Place 1 tablet (0.4 mg total) under the tongue every 5 (five) minutes as needed for chest pain.   omeprazole 20 MG capsule Commonly known as:  PRILOSEC Take 20 mg by mouth daily.   OXYGEN Inhale into the lungs. 2 liters at hs   polyethylene glycol packet Commonly known as:  MIRALAX / GLYCOLAX Take 17 g by mouth 2 (two) times daily.   potassium chloride 10 MEQ tablet Commonly known as:  K-DUR Take 10 mEq by mouth at bedtime.   ranitidine 150 MG capsule Commonly known as:  ZANTAC Take 300 mg by mouth at bedtime.   sertraline 100 MG tablet Commonly known as:  ZOLOFT Take 150 mg by mouth at bedtime.   triamterene-hydrochlorothiazide 37.5-25 MG capsule Commonly known as:  DYAZIDE Take 1 capsule by mouth every morning.        Signed: [place] Pugh. Melisha Eggleton   PA-C  01/25/2016, 9:55 AM

## 2016-02-01 ENCOUNTER — Emergency Department (HOSPITAL_COMMUNITY): Payer: BLUE CROSS/BLUE SHIELD

## 2016-02-01 ENCOUNTER — Encounter (HOSPITAL_COMMUNITY): Payer: Self-pay | Admitting: Emergency Medicine

## 2016-02-01 ENCOUNTER — Emergency Department (HOSPITAL_COMMUNITY)
Admission: EM | Admit: 2016-02-01 | Discharge: 2016-02-01 | Disposition: A | Discharge status: Home / Self Care | Payer: BLUE CROSS/BLUE SHIELD | Attending: Emergency Medicine | Admitting: Emergency Medicine

## 2016-02-01 DIAGNOSIS — S0003XA Contusion of scalp, initial encounter: Secondary | ICD-10-CM | POA: Diagnosis not present

## 2016-02-01 DIAGNOSIS — M25561 Pain in right knee: Secondary | ICD-10-CM | POA: Insufficient documentation

## 2016-02-01 DIAGNOSIS — Y999 Unspecified external cause status: Secondary | ICD-10-CM | POA: Diagnosis not present

## 2016-02-01 DIAGNOSIS — I10 Essential (primary) hypertension: Secondary | ICD-10-CM | POA: Insufficient documentation

## 2016-02-01 DIAGNOSIS — M25521 Pain in right elbow: Secondary | ICD-10-CM | POA: Diagnosis not present

## 2016-02-01 DIAGNOSIS — Z79899 Other long term (current) drug therapy: Secondary | ICD-10-CM | POA: Insufficient documentation

## 2016-02-01 DIAGNOSIS — E039 Hypothyroidism, unspecified: Secondary | ICD-10-CM | POA: Insufficient documentation

## 2016-02-01 DIAGNOSIS — F17210 Nicotine dependence, cigarettes, uncomplicated: Secondary | ICD-10-CM | POA: Insufficient documentation

## 2016-02-01 DIAGNOSIS — Y939 Activity, unspecified: Secondary | ICD-10-CM | POA: Diagnosis not present

## 2016-02-01 DIAGNOSIS — W1839XA Other fall on same level, initial encounter: Secondary | ICD-10-CM | POA: Insufficient documentation

## 2016-02-01 DIAGNOSIS — Y92002 Bathroom of unspecified non-institutional (private) residence single-family (private) house as the place of occurrence of the external cause: Secondary | ICD-10-CM | POA: Insufficient documentation

## 2016-02-01 DIAGNOSIS — Z7982 Long term (current) use of aspirin: Secondary | ICD-10-CM | POA: Diagnosis not present

## 2016-02-01 DIAGNOSIS — S79911A Unspecified injury of right hip, initial encounter: Secondary | ICD-10-CM | POA: Diagnosis present

## 2016-02-01 DIAGNOSIS — R55 Syncope and collapse: Secondary | ICD-10-CM | POA: Insufficient documentation

## 2016-02-01 DIAGNOSIS — S73034A Other anterior dislocation of right hip, initial encounter: Secondary | ICD-10-CM | POA: Insufficient documentation

## 2016-02-01 DIAGNOSIS — J449 Chronic obstructive pulmonary disease, unspecified: Secondary | ICD-10-CM | POA: Insufficient documentation

## 2016-02-01 LAB — CBG MONITORING, ED: GLUCOSE-CAPILLARY: 88 mg/dL (ref 65–99)

## 2016-02-01 MED ORDER — PROPOFOL 10 MG/ML IV BOLUS
0.5000 mg/kg | Freq: Once | INTRAVENOUS | Status: AC
  Administered 2016-02-01: 75 mg via INTRAVENOUS
  Filled 2016-02-01: qty 20

## 2016-02-01 MED ORDER — KETAMINE HCL 10 MG/ML IJ SOLN
0.5000 mg/kg | Freq: Once | INTRAMUSCULAR | Status: AC
  Administered 2016-02-01: 75 mg via INTRAVENOUS
  Filled 2016-02-01: qty 1

## 2016-02-01 MED ORDER — SODIUM CHLORIDE 0.9 % IV SOLN
INTRAVENOUS | Status: DC
  Administered 2016-02-01: 19:00:00 via INTRAVENOUS

## 2016-02-01 MED ORDER — ONDANSETRON HCL 4 MG/2ML IJ SOLN
4.0000 mg | Freq: Once | INTRAMUSCULAR | Status: AC
  Administered 2016-02-01: 4 mg via INTRAVENOUS
  Filled 2016-02-01: qty 2

## 2016-02-01 MED ORDER — FENTANYL CITRATE (PF) 100 MCG/2ML IJ SOLN
100.0000 ug | INTRAMUSCULAR | Status: DC | PRN
  Administered 2016-02-01 (×2): 100 ug via INTRAVENOUS
  Filled 2016-02-01 (×2): qty 2

## 2016-02-01 MED ORDER — SODIUM CHLORIDE 0.9 % IV BOLUS (SEPSIS)
500.0000 mL | Freq: Once | INTRAVENOUS | Status: AC
  Administered 2016-02-01: 500 mL via INTRAVENOUS

## 2016-02-01 NOTE — ED Triage Notes (Signed)
Pt reports she was in the bathroom when she had an episode of LOC. Denies hitting head, dizziness, N/V/. States she has L elbow pain, R hip pain. R hip was operated on 01/01/16 to "fix" previous replacement. Pt also has known R elbow fx per pt.

## 2016-02-01 NOTE — ED Provider Notes (Signed)
[facility] DEPT Provider Note   CSN: [id number] Arrival date & time: 02/01/16  1801     History   Chief Complaint Chief Complaint  Patient presents with  . Loss of Consciousness    HPI [name] is a 61 y.o. female.  She was in the bathroom, getting ready to come to the hospital to visit her daughter, when she fell. Her husband heard her fall and came to her and found her unconscious. She remained unconscious for 1 minute. She was able to get up, and stand using her walker, but unable to walk because of right leg pain. She feels that she injured her head and her right elbow as well as the right hip, when she fell. It was no prodrome. She has only had some cake today. She's not had any altered appetite, nausea, vomiting, fever or chills recently. She had hip replacement 01/01/2016 and, has dislocated that same hip twice since then. There are no other known modifying factors.  HPI  Past Medical History:  Diagnosis Date  . Allergic rhinitis   . Anemia   . Anxiety   . Chronic back pain   . COPD (chronic obstructive pulmonary disease) ([place])   . Depression   . Essential hypertension   . GERD (gastroesophageal reflux disease)   . Headache    migraines  . History of gastritis    EGD 2015  . History of home oxygen therapy    2 liters at hs last 6 months  . Hypothyroidism   . Osteoarthritis    oa  . Scoliosis     Patient Active Problem List   Diagnosis Date Noted  . Anterior dislocation of right hip ([place]) 01/19/2016  . S/P revision of total hip 01/01/2016  . Abnormal nuclear stress test: Intermediate Risk 10/31/2015  . Atypical angina ([place]) 10/31/2015  . Hip bursitis 08/20/2012  . Trigger finger, acquired 08/20/2012  . TOBACCO USER 12/12/2008  . DYSLIPIDEMIA 11/17/2008  . ANXIETY DISORDER 11/16/2008  . HYPERTENSION, BENIGN 11/16/2008  . GEN OSTEOARTHROSIS INVOLVING MULTIPLE SITES 11/16/2008  . CHEST DISCOMFORT, ATYPICAL 11/16/2008    Past Surgical History:    Procedure Laterality Date  . APPENDECTOMY     1985  . CARDIAC CATHETERIZATION N/A 10/31/2015   Procedure: Left Heart Cath and Coronary Angiography;  Surgeon: Leonie Man, MD;  Location: [facility];  Service: Cardiovascular;  Laterality: N/A;  . CARPAL TUNNEL RELEASE Left   . CARPAL TUNNEL RELEASE Right   . CHOLECYSTECTOMY  late 1980's  . HIP CLOSED REDUCTION Right 01/08/2016   Procedure: CLOSED MANIPULATION HIP;  Surgeon: Susa Day, MD;  Location: [facility];  Service: Orthopedics;  Laterality: Right;  . HIP CLOSED REDUCTION Right 01/19/2016   Procedure: ATTEMPTED CLOSED REDUCTION RIGHT HIP;  Surgeon: Wylene Simmer, MD;  Location: [facility];  Service: Orthopedics;  Laterality: Right;  . HIP CLOSED REDUCTION Right 01/20/2016   Procedure: CLOSED REDUCTION RIGHT TOTAL HIP;  Surgeon: Paralee Cancel, MD;  Location: [facility];  Service: Orthopedics;  Laterality: Right;  . TONSILLECTOMY    . TOTAL ABDOMINAL HYSTERECTOMY     1985, with 1 ovary removed and 2 nd ovary removed 2003  . TOTAL HIP ARTHROPLASTY Right    Original surgery 2006 with revision 2010  . TOTAL HIP REVISION Right 01/01/2016   Procedure: TOTAL HIP REVISION;  Surgeon: Paralee Cancel, MD;  Location: [facility];  Service: Orthopedics;  Laterality: Right;  . ULNAR NERVE TRANSPOSITION Right     OB  History    No data available       Home Medications    Prior to Admission medications   Medication Sig Start Date End Date Taking? Authorizing Provider  ALPRAZolam Duanne Moron) 1 MG tablet Take 1 mg by mouth 4 (four) times daily.   Yes Historical Provider, MD  aspirin 81 MG chewable tablet Chew 1 tablet (81 mg total) by mouth 2 (two) times daily. Take for 4 weeks. 01/02/16  Yes Matthew Babish, PA-C  Aspirin-Acetaminophen-Caffeine (GOODY HEADACHE PO) Take 1 packet by mouth daily as needed.   Yes Historical Provider, MD  atorvastatin (LIPITOR) 20 MG tablet Take 20 mg by mouth at bedtime.    Yes Historical Provider, MD  fluticasone (FLONASE) 50  MCG/ACT nasal spray Place 2 sprays into both nostrils daily.   Yes Historical Provider, MD  HYDROcodone-acetaminophen (NORCO) 10-325 MG tablet Take 1-2 tablets by mouth every 4 (four) hours as needed. Patient taking differently: Take 1-2 tablets by mouth every 4 (four) hours as needed for moderate pain or severe pain.  01/02/16  Yes Danae Orleans, PA-C  levothyroxine (SYNTHROID, LEVOTHROID) 75 MCG tablet Take 75 mcg by mouth daily before breakfast.   Yes Historical Provider, MD  loratadine (CLARITIN) 10 MG tablet Take 10 mg by mouth daily.   Yes Historical Provider, MD  methocarbamol (ROBAXIN) 500 MG tablet Take 1 tablet (500 mg total) by mouth every 6 (six) hours as needed for muscle spasms. 01/02/16  Yes Danae Orleans, PA-C  omeprazole (PRILOSEC) 20 MG capsule Take 20 mg by mouth daily.   Yes Historical Provider, MD  OXYGEN Inhale into the lungs. 2 liters at hs   Yes Historical Provider, MD  potassium chloride (K-DUR) 10 MEQ tablet Take 10 mEq by mouth at bedtime.    Yes Historical Provider, MD  ranitidine (ZANTAC) 150 MG capsule Take 300 mg by mouth at bedtime.    Yes Historical Provider, MD  sertraline (ZOLOFT) 100 MG tablet Take 150 mg by mouth at bedtime.    Yes Historical Provider, MD  Tetrahydrozoline HCl (EYE DROPS OP) Apply 2 drops to eye daily as needed.   Yes Historical Provider, MD  triamterene-hydrochlorothiazide (DYAZIDE) 37.5-25 MG per capsule Take 1 capsule by mouth every morning.   Yes Historical Provider, MD  docusate sodium (COLACE) 100 MG capsule Take 1 capsule (100 mg total) by mouth 2 (two) times daily. Patient taking differently: Take 100 mg by mouth daily as needed for moderate constipation.  01/02/16   Danae Orleans, PA-C  ferrous sulfate 325 (65 FE) MG tablet Take 1 tablet (325 mg total) by mouth 3 (three) times daily after meals. Patient not taking: Reported on 01/08/2016 01/02/16   Danae Orleans, PA-C  nitroGLYCERIN (NITROSTAT) 0.4 MG SL tablet Place 1 tablet (0.4 mg  total) under the tongue every 5 (five) minutes as needed for chest pain. Patient not taking: Reported on 01/08/2016 10/26/15 01/24/16  Lendon Colonel, NP  polyethylene glycol [facility] / Floria Raveling) packet Take 17 g by mouth 2 (two) times daily. Patient not taking: Reported on 01/08/2016 01/02/16   Danae Orleans, PA-C    Family History Family History  Problem Relation Age of Onset  . COPD Mother   . Lung disease Father     Asbestosis  . Heart attack Father   . Cerebral aneurysm Brother     Social History Social History  Substance Use Topics  . Smoking status: Current Every Day Smoker    Packs/day: 0.50    Years: 40.00  Types: Cigarettes  . Smokeless tobacco: Never Used  . Alcohol use No     Allergies   Aleve [naproxen sodium]; Codeine; Penicillins; and Sulfonamide derivatives   Review of Systems Review of Systems  All other systems reviewed and are negative.    Physical Exam Updated Vital Signs BP 124/66 (BP Location: Left Arm)   Pulse 76   Temp 98.2 F (36.8 C) (Oral)   Resp 14   Ht '5\' 2"'$  (1.575 m)   Wt 210 lb (95.3 kg)   SpO2 100%   BMI 38.41 kg/m   Physical Exam  Constitutional: She is oriented to person, place, and time. She appears well-developed and well-nourished.  HENT:  Head: Normocephalic.  Small contusion right occiput, without abrasion or laceration.  Eyes: Conjunctivae and EOM are normal. Pupils are equal, round, and reactive to light.  Neck: Normal range of motion and phonation normal. Neck supple.  Cardiovascular: Normal rate and regular rhythm.   Pulmonary/Chest: Effort normal and breath sounds normal. She exhibits no tenderness.  No chest wall crepitation or deformity  Abdominal: Soft. She exhibits no distension. There is no tenderness. There is no guarding.  Musculoskeletal: Normal range of motion.  No tenderness to step-off of the cervical spine. Mild tenderness of the right elbow but normal range of motion. Right leg shortened,  relative to left, with pain in the right knee and right hip, with motion and palpation.  Neurological: She is alert and oriented to person, place, and time. She exhibits normal muscle tone.  Skin: Skin is warm and dry.  Psychiatric: She has a normal mood and affect. Her behavior is normal. Judgment and thought content normal.  Nursing note and vitals reviewed.    ED Treatments / Results  Labs (all labs ordered are listed, but only abnormal results are displayed) Labs Reviewed  CBG MONITORING, ED    EKG  EKG Interpretation  Date/Time:  Thursday February 01 2016 18:11:13 EST Ventricular Rate:  87 PR Interval:    QRS Duration: 94 QT Interval:  401 QTC Calculation: 483 R Axis:   46 Text Interpretation:  Sinus rhythm since last tracing no significant change Confirmed by Eulis Foster  MD, Tosha Belgarde [id number]) on 02/01/2016 6:17:51 PM       Radiology Ct Head Wo Contrast  Result Date: 02/01/2016 CLINICAL DATA:  Status post loss of consciousness foot fall and trauma to the posterior head. EXAM: CT HEAD WITHOUT CONTRAST CT CERVICAL SPINE WITHOUT CONTRAST TECHNIQUE: Multidetector CT imaging of the head and cervical spine was performed following the standard protocol without intravenous contrast. Multiplanar CT image reconstructions of the cervical spine were also generated. COMPARISON:  None. FINDINGS: CT HEAD FINDINGS Brain: No evidence of acute infarction, hemorrhage, hydrocephalus, extra-axial collection or mass lesion/mass effect. Vascular: No hyperdense vessel or unexpected calcification. Skull: Normal. Negative for fracture or focal lesion. Sinuses/Orbits: No acute finding. Other: None. CT CERVICAL SPINE FINDINGS Alignment: Normal. Skull base and vertebrae: No acute fracture. No primary bone lesion or focal pathologic process. Soft tissues and spinal canal: No prevertebral fluid or swelling. No visible canal hematoma. Disc levels: Multilevel osteoarthritic changes, worse at C6-C7, where there is a large  disc osteophyte complex. Upper chest: Negative. Other: None. IMPRESSION: No evidence of acute intracranial injury. No evidence of acute injury to the cervical spine. Osteoarthritic changes of the cervical spine, worse at C6-C7. Electronically Signed   By: Fidela Salisbury M.D.   On: 02/01/2016 20:22   Ct Cervical Spine Wo Contrast  Result Date: 02/01/2016  CLINICAL DATA:  Status post loss of consciousness foot fall and trauma to the posterior head. EXAM: CT HEAD WITHOUT CONTRAST CT CERVICAL SPINE WITHOUT CONTRAST TECHNIQUE: Multidetector CT imaging of the head and cervical spine was performed following the standard protocol without intravenous contrast. Multiplanar CT image reconstructions of the cervical spine were also generated. COMPARISON:  None. FINDINGS: CT HEAD FINDINGS Brain: No evidence of acute infarction, hemorrhage, hydrocephalus, extra-axial collection or mass lesion/mass effect. Vascular: No hyperdense vessel or unexpected calcification. Skull: Normal. Negative for fracture or focal lesion. Sinuses/Orbits: No acute finding. Other: None. CT CERVICAL SPINE FINDINGS Alignment: Normal. Skull base and vertebrae: No acute fracture. No primary bone lesion or focal pathologic process. Soft tissues and spinal canal: No prevertebral fluid or swelling. No visible canal hematoma. Disc levels: Multilevel osteoarthritic changes, worse at C6-C7, where there is a large disc osteophyte complex. Upper chest: Negative. Other: None. IMPRESSION: No evidence of acute intracranial injury. No evidence of acute injury to the cervical spine. Osteoarthritic changes of the cervical spine, worse at C6-C7. Electronically Signed   By: Fidela Salisbury M.D.   On: 02/01/2016 20:22   Dg Knee Complete 4 Views Right  Result Date: 02/01/2016 CLINICAL DATA:  Fall with pain EXAM: RIGHT KNEE - COMPLETE 4+ VIEW COMPARISON:  None. FINDINGS: No fracture or dislocation is visualized. Mild to moderate medial and lateral joint space  compartment narrowing with small osteophytes. Faint lateral and medial joint space calcifications. IMPRESSION: 1. No acute osseous abnormality 2. Mild to moderate degenerative changes 3. Chondrocalcinosis Electronically Signed   By: Donavan Foil M.D.   On: 02/01/2016 20:10   Dg Hip Port Unilat W Or Wo Pelvis 1 View Right  Result Date: 02/01/2016 CLINICAL DATA:  Right hip reduction after fall. EXAM: DG HIP (WITH OR WITHOUT PELVIS) 1V PORT RIGHT COMPARISON:  02/01/2016 radiographs of the right hip FINDINGS: Satisfactory reduction of right hip dislocation without post procedure fracture nor hardware failure noted. IMPRESSION: Satisfactory reduction of right hip arthroplasty. Electronically Signed   By: Ashley Royalty M.D.   On: 02/01/2016 22:07   Dg Hip Unilat With Pelvis 2-3 Views Right  Result Date: 02/01/2016 CLINICAL DATA:  Right hip and knee pain, fall EXAM: DG HIP (WITH OR WITHOUT PELVIS) 2-3V RIGHT COMPARISON:  01/19/2016 FINDINGS: Left femoral head projects in joint. Pubic symphysis appears intact. The SI joints are symmetric. Multiple calcified phleboliths in the pelvis. The patient is status post right hip replacement. There is anterior, superior dislocation of the right femoral portion of the prosthesis with respect to the acetabular cup. There is no obvious fracture. IMPRESSION: Abnormal alignment of right hip replacement. The femoral component is dislocated superiorly, anteriorly and slightly medial to the acetabular portion of the replacement. Electronically Signed   By: Donavan Foil M.D.   On: 02/01/2016 20:08    Procedures .Sedation Date/Time: 02/01/2016 8:54 PM Performed by: Daleen Bo Authorized by: Daleen Bo   Consent:    Consent obtained:  Verbal and written   Consent given by:  Patient and spouse   Risks discussed:  Inadequate sedation, prolonged sedation necessitating reversal, prolonged hypoxia resulting in organ damage, respiratory compromise necessitating ventilatory  assistance and intubation, nausea and vomiting   Alternatives discussed:  Analgesia without sedation and anxiolysis Indications:    Procedure performed:  Dislocation reduction   Procedure necessitating sedation performed by:  Physician performing sedation   Intended level of sedation:  Deep Pre-sedation assessment:    Time since last food or drink:  4  hours   ASA classification: class 2 - patient with mild systemic disease     Neck mobility: normal     Mallampati score:  III - soft palate, base of uvula visible   Pre-sedation assessments completed and reviewed: airway patency, cardiovascular function, hydration status and mental status     Pre-sedation assessments completed and reviewed: nausea/vomiting not reviewed     Pre-sedation assessment completed:  02/01/2016 8:54 PM Immediate pre-procedure details:    Reassessment: Patient reassessed immediately prior to procedure     Reviewed: vital signs and relevant labs/tests     Verified: bag valve mask available, emergency equipment available, intubation equipment available, IV patency confirmed and oxygen available   Procedure details (see MAR for exact dosages):    Sedation start time:  02/01/2016 9:24 PM   Preoxygenation:  Nasal cannula   Sedation:  Ketamine (and Propofol)   Analgesia:  Fentanyl   Intra-procedure monitoring:  Blood pressure monitoring, cardiac monitor, continuous capnometry, continuous pulse oximetry, frequent LOC assessments and frequent vital sign checks   Intra-procedure events: none     Total sedation time (minutes):  25 Post-procedure details:    Post-sedation assessment completed:  02/01/2016 9:45 PM   Attendance: Constant attendance by certified staff until patient recovered     Recovery: Patient returned to pre-procedure baseline     Post-sedation assessments completed and reviewed: airway patency, cardiovascular function, hydration status and mental status     Post-sedation assessments completed and reviewed:  nausea/vomiting not reviewed and pain level not reviewed     Patient tolerance:  Tolerated well, no immediate complications Reduction of dislocation Date/Time: 02/01/2016 9:46 PM Performed by: Daleen Bo Authorized by: Daleen Bo  Consent: Verbal consent obtained. Written consent obtained. Risks and benefits: risks, benefits and alternatives were discussed Consent given by: patient Patient understanding: patient states understanding of the procedure being performed Patient consent: the patient's understanding of the procedure matches consent given Procedure consent: procedure consent matches procedure scheduled Relevant documents: relevant documents present and verified Site marked: the operative site was not marked Imaging studies: imaging studies available Patient identity confirmed: verbally with patient and arm band Time out: Immediately prior to procedure a "time out" was called to verify the correct patient, procedure, equipment, support staff and site/side marked as required. Local anesthesia used: no  Anesthesia: Local anesthesia used: no  Sedation: Patient sedated: yes Sedatives: propofol (and Ketamine) Sedation start date/time: 02/01/2016 9:24 PM Sedation end date/time: 02/01/2016 9:45 PM Vitals: Vital signs were monitored during sedation. Patient tolerance: Patient tolerated the procedure well with no immediate complications Comments: Right hip dislocation, reduction, with a combination of maneuvers including lengthening, hip flexion with internal and external rotation, Morgan technique. Length of right leg restored to normal. Right knee immobilizer applied, by me with the help of ancillary staff. Initial one view hip radiograph, shows reduction.    (including critical care time)  Medications Ordered in ED Medications  0.9 %  sodium chloride infusion ( Intravenous New Bag/Given 02/01/16 1920)  fentaNYL (SUBLIMAZE) injection 100 mcg (100 mcg Intravenous Given 02/01/16  2035)  ondansetron (ZOFRAN) injection 4 mg (4 mg Intravenous Given 02/01/16 1920)  propofol (DIPRIVAN) 10 mg/mL bolus/IV push 47.7 mg (75 mg Intravenous Given 02/01/16 2209)  ketamine (KETALAR) injection 48 mg (75 mg Intravenous Given 02/01/16 2211)  sodium chloride 0.9 % bolus 500 mL (0 mLs Intravenous Stopped 02/01/16 2203)     Initial Impression / Assessment and Plan / ED Course  I have reviewed the triage vital  signs and the nursing notes.  Pertinent labs & imaging results that were available during my care of the patient were reviewed by me and considered in my medical decision making (see chart for details).  Clinical Course as of Jan 31 2214  Thu Feb 01, 2016  2048 DG Elbow Complete Right [EW]  2209 DG Hip [place] or [place] Pelvis 1 View Right [EW]    Clinical Course User Index [EW] Daleen Bo, MD    Medications  0.9 %  sodium chloride infusion ( Intravenous New Bag/Given 02/01/16 1920)  fentaNYL (SUBLIMAZE) injection 100 mcg (100 mcg Intravenous Given 02/01/16 2035)  ondansetron (ZOFRAN) injection 4 mg (4 mg Intravenous Given 02/01/16 1920)  propofol (DIPRIVAN) 10 mg/mL bolus/IV push 47.7 mg (75 mg Intravenous Given 02/01/16 2209)  ketamine (KETALAR) injection 48 mg (75 mg Intravenous Given 02/01/16 2211)  sodium chloride 0.9 % bolus 500 mL (0 mLs Intravenous Stopped 02/01/16 2203)    Patient Vitals for the past 24 hrs:  BP Temp Temp src Pulse Resp SpO2 Height Weight  02/01/16 2213 124/66 - - 76 14 100 % - -  02/01/16 2157 117/72 - - 73 11 100 % - -  02/01/16 2150 143/72 - - 70 13 100 % - -  02/01/16 2145 102/78 - - 74 12 100 % - -  02/01/16 2140 100/57 - - 72 16 100 % - -  02/01/16 2135 106/63 - - 74 17 98 % - -  02/01/16 2130 109/66 - - 74 18 96 % - -  02/01/16 2125 106/75 - - 78 25 93 % - -  02/01/16 2120 113/85 - - 77 20 99 % - -  02/01/16 1815 127/85 98.2 F (36.8 C) Oral 87 22 100 % - -  02/01/16 1805 - - - - - - '5\' 2"'$  (1.575 m) 210 lb (95.3 kg)    9:49 PM Reevaluation  with update and discussion. After initial assessment and treatment, an updated evaluation reveals Patient alert and comfortable at this time. Patient updated on findings and plan. Deysha Cartier L    Final Clinical Impressions(s) / ED Diagnoses   Final diagnoses:  Anterior dislocation of right hip, initial encounter ([place])  Syncope, unspecified syncope type   Recurrent hip dislocation. Third time, since surgery to replace the right hip prosthesis, one month ago today. Reduced in the emergency department, by me. She still for discharge home, wear knee immobilizer with 100%.  Nursing Notes Reviewed/ Care Coordinated Applicable Imaging Reviewed Interpretation of Laboratory Data incorporated into ED treatment  The patient appears reasonably screened and/or stabilized for discharge and I doubt any other medical condition or other [facility] requiring further screening, evaluation, or treatment in the ED at this time prior to discharge.  Plan: Home Medications- continue; Home Treatments- rest; return here if the recommended treatment, does not improve the symptoms; Recommended follow up- Ortho f/u next week    New Prescriptions New Prescriptions   No medications on file     Daleen Bo, MD 02/01/16 2217

## 2016-02-01 NOTE — ED Notes (Signed)
Pt talking to family members at this time, alert.

## 2016-02-01 NOTE — Discharge Instructions (Signed)
Wear the knee immobilizer, all the time, unless you need to take it off for bathing or changing your clothing.. Careful to avoid bending her right knee.  Follow-up with your orthopedist, because you have had a another dislocation, next week.

## 2016-02-01 NOTE — ED Notes (Signed)
$'25mg'N$  Propofol and 25 Ketamine.

## 2016-02-01 NOTE — ED Notes (Signed)
Pt is back to baseline from prior to sedation given

## 2016-02-01 NOTE — ED Notes (Signed)
$'25mg'R$  Ketamine and '25mg'$  Propofol given.

## 2016-02-16 ENCOUNTER — Emergency Department (HOSPITAL_COMMUNITY): Payer: BLUE CROSS/BLUE SHIELD

## 2016-02-16 ENCOUNTER — Encounter (HOSPITAL_COMMUNITY): Payer: Self-pay | Admitting: *Deleted

## 2016-02-16 ENCOUNTER — Observation Stay (HOSPITAL_COMMUNITY)
Admission: EM | Admit: 2016-02-16 | Discharge: 2016-02-17 | Disposition: A | Discharge status: Home / Self Care | Payer: BLUE CROSS/BLUE SHIELD | Attending: Orthopedic Surgery | Admitting: Orthopedic Surgery

## 2016-02-16 DIAGNOSIS — T84020A Dislocation of internal right hip prosthesis, initial encounter: Principal | ICD-10-CM | POA: Insufficient documentation

## 2016-02-16 DIAGNOSIS — Z7951 Long term (current) use of inhaled steroids: Secondary | ICD-10-CM | POA: Diagnosis not present

## 2016-02-16 DIAGNOSIS — J309 Allergic rhinitis, unspecified: Secondary | ICD-10-CM | POA: Diagnosis not present

## 2016-02-16 DIAGNOSIS — F17210 Nicotine dependence, cigarettes, uncomplicated: Secondary | ICD-10-CM | POA: Insufficient documentation

## 2016-02-16 DIAGNOSIS — E039 Hypothyroidism, unspecified: Secondary | ICD-10-CM | POA: Diagnosis not present

## 2016-02-16 DIAGNOSIS — J449 Chronic obstructive pulmonary disease, unspecified: Secondary | ICD-10-CM | POA: Insufficient documentation

## 2016-02-16 DIAGNOSIS — Z96649 Presence of unspecified artificial hip joint: Secondary | ICD-10-CM

## 2016-02-16 DIAGNOSIS — K219 Gastro-esophageal reflux disease without esophagitis: Secondary | ICD-10-CM | POA: Insufficient documentation

## 2016-02-16 DIAGNOSIS — Z419 Encounter for procedure for purposes other than remedying health state, unspecified: Secondary | ICD-10-CM

## 2016-02-16 DIAGNOSIS — S73004A Unspecified dislocation of right hip, initial encounter: Secondary | ICD-10-CM

## 2016-02-16 DIAGNOSIS — I10 Essential (primary) hypertension: Secondary | ICD-10-CM | POA: Insufficient documentation

## 2016-02-16 DIAGNOSIS — Y831 Surgical operation with implant of artificial internal device as the cause of abnormal reaction of the patient, or of later complication, without mention of misadventure at the time of the procedure: Secondary | ICD-10-CM | POA: Diagnosis not present

## 2016-02-16 DIAGNOSIS — Z9981 Dependence on supplemental oxygen: Secondary | ICD-10-CM | POA: Insufficient documentation

## 2016-02-16 DIAGNOSIS — Z79899 Other long term (current) drug therapy: Secondary | ICD-10-CM | POA: Insufficient documentation

## 2016-02-16 DIAGNOSIS — T84029A Dislocation of unspecified internal joint prosthesis, initial encounter: Secondary | ICD-10-CM

## 2016-02-16 DIAGNOSIS — F419 Anxiety disorder, unspecified: Secondary | ICD-10-CM | POA: Insufficient documentation

## 2016-02-16 DIAGNOSIS — F329 Major depressive disorder, single episode, unspecified: Secondary | ICD-10-CM | POA: Diagnosis not present

## 2016-02-16 MED ORDER — PROPOFOL 10 MG/ML IV BOLUS
40.0000 mg | Freq: Once | INTRAVENOUS | Status: AC
  Administered 2016-02-16: 40 mg via INTRAVENOUS
  Filled 2016-02-16: qty 20

## 2016-02-16 MED ORDER — HYDROMORPHONE HCL 1 MG/ML IJ SOLN
1.0000 mg | Freq: Once | INTRAMUSCULAR | Status: AC
  Administered 2016-02-16: 1 mg via INTRAVENOUS
  Filled 2016-02-16: qty 1

## 2016-02-16 NOTE — ED Provider Notes (Signed)
[facility] DEPT Provider Note   CSN: [id number] Arrival date & time: 02/16/16  2002     History   Chief Complaint Chief Complaint  Patient presents with  . Fall    HPI [name] is a 61 y.o. female.  Pt presents to the ED today with possible right hip dislocation.  The pt had a total hip revision by Dr. Alvan Dame on 12/4.  Since then, her hip has dislocated 3 times before today.  Pt said that she has followed up with Dr. Alvan Dame since her hip has dislocated.  She has been wearing a leg brace and has been walking with her walker as directed.  She said she was walking tonight, when her leg "did a dance" and she felt her hip dislocate.  She then fell.  Pt called EMS who gave her 10 mg of morphine en route.      Past Medical History:  Diagnosis Date  . Allergic rhinitis   . Anemia   . Anxiety   . Chronic back pain   . COPD (chronic obstructive pulmonary disease) ([place])   . Depression   . Essential hypertension   . GERD (gastroesophageal reflux disease)   . Headache    migraines  . History of gastritis    EGD 2015  . History of home oxygen therapy    2 liters at hs last 6 months  . Hypothyroidism   . Osteoarthritis    oa  . Scoliosis     Patient Active Problem List   Diagnosis Date Noted  . Anterior dislocation of right hip ([place]) 01/19/2016  . S/P revision of total hip 01/01/2016  . Abnormal nuclear stress test: Intermediate Risk 10/31/2015  . Atypical angina ([place]) 10/31/2015  . Hip bursitis 08/20/2012  . Trigger finger, acquired 08/20/2012  . TOBACCO USER 12/12/2008  . DYSLIPIDEMIA 11/17/2008  . ANXIETY DISORDER 11/16/2008  . HYPERTENSION, BENIGN 11/16/2008  . GEN OSTEOARTHROSIS INVOLVING MULTIPLE SITES 11/16/2008  . CHEST DISCOMFORT, ATYPICAL 11/16/2008    Past Surgical History:  Procedure Laterality Date  . APPENDECTOMY     1985  . CARDIAC CATHETERIZATION N/A 10/31/2015   Procedure: Left Heart Cath and Coronary Angiography;  Surgeon: Leonie Man,  MD;  Location: [facility];  Service: Cardiovascular;  Laterality: N/A;  . CARPAL TUNNEL RELEASE Left   . CARPAL TUNNEL RELEASE Right   . CHOLECYSTECTOMY  late 1980's  . HIP CLOSED REDUCTION Right 01/08/2016   Procedure: CLOSED MANIPULATION HIP;  Surgeon: Susa Day, MD;  Location: [facility];  Service: Orthopedics;  Laterality: Right;  . HIP CLOSED REDUCTION Right 01/19/2016   Procedure: ATTEMPTED CLOSED REDUCTION RIGHT HIP;  Surgeon: Wylene Simmer, MD;  Location: [facility];  Service: Orthopedics;  Laterality: Right;  . HIP CLOSED REDUCTION Right 01/20/2016   Procedure: CLOSED REDUCTION RIGHT TOTAL HIP;  Surgeon: Paralee Cancel, MD;  Location: [facility];  Service: Orthopedics;  Laterality: Right;  . TONSILLECTOMY    . TOTAL ABDOMINAL HYSTERECTOMY     1985, with 1 ovary removed and 2 nd ovary removed 2003  . TOTAL HIP ARTHROPLASTY Right    Original surgery 2006 with revision 2010  . TOTAL HIP REVISION Right 01/01/2016   Procedure: TOTAL HIP REVISION;  Surgeon: Paralee Cancel, MD;  Location: [facility];  Service: Orthopedics;  Laterality: Right;  . ULNAR NERVE TRANSPOSITION Right     OB History    No data available       Home Medications    Prior  to Admission medications   Medication Sig Start Date End Date Taking? Authorizing Provider  ALPRAZolam Duanne Moron) 1 MG tablet Take 1 mg by mouth 4 (four) times daily.    Historical Provider, MD  aspirin 81 MG chewable tablet Chew 1 tablet (81 mg total) by mouth 2 (two) times daily. Take for 4 weeks. 01/02/16   Danae Orleans, PA-C  Aspirin-Acetaminophen-Caffeine (GOODY HEADACHE PO) Take 1 packet by mouth daily as needed.    Historical Provider, MD  atorvastatin (LIPITOR) 20 MG tablet Take 20 mg by mouth at bedtime.     Historical Provider, MD  docusate sodium (COLACE) 100 MG capsule Take 1 capsule (100 mg total) by mouth 2 (two) times daily. Patient taking differently: Take 100 mg by mouth daily as needed for moderate constipation.  01/02/16   Danae Orleans, PA-C  ferrous sulfate 325 (65 FE) MG tablet Take 1 tablet (325 mg total) by mouth 3 (three) times daily after meals. Patient not taking: Reported on 01/08/2016 01/02/16   Danae Orleans, PA-C  fluticasone [facility]) 50 MCG/ACT nasal spray Place 2 sprays into both nostrils daily.    Historical Provider, MD  HYDROcodone-acetaminophen (NORCO) 10-325 MG tablet Take 1-2 tablets by mouth every 4 (four) hours as needed. Patient taking differently: Take 1-2 tablets by mouth every 4 (four) hours as needed for moderate pain or severe pain.  01/02/16   Danae Orleans, PA-C  levothyroxine (SYNTHROID, LEVOTHROID) 75 MCG tablet Take 75 mcg by mouth daily before breakfast.    Historical Provider, MD  loratadine (CLARITIN) 10 MG tablet Take 10 mg by mouth daily.    Historical Provider, MD  methocarbamol (ROBAXIN) 500 MG tablet Take 1 tablet (500 mg total) by mouth every 6 (six) hours as needed for muscle spasms. 01/02/16   Danae Orleans, PA-C  nitroGLYCERIN (NITROSTAT) 0.4 MG SL tablet Place 1 tablet (0.4 mg total) under the tongue every 5 (five) minutes as needed for chest pain. Patient not taking: Reported on 01/08/2016 10/26/15 01/24/16  Lendon Colonel, NP  omeprazole (PRILOSEC) 20 MG capsule Take 20 mg by mouth daily.    Historical Provider, MD  OXYGEN Inhale into the lungs. 2 liters at hs    Historical Provider, MD  polyethylene glycol (MIRALAX / GLYCOLAX) packet Take 17 g by mouth 2 (two) times daily. Patient not taking: Reported on 01/08/2016 01/02/16   Danae Orleans, PA-C  potassium chloride (K-DUR) 10 MEQ tablet Take 10 mEq by mouth at bedtime.     Historical Provider, MD  ranitidine (ZANTAC) 150 MG capsule Take 300 mg by mouth at bedtime.     Historical Provider, MD  sertraline (ZOLOFT) 100 MG tablet Take 150 mg by mouth at bedtime.     Historical Provider, MD  Tetrahydrozoline HCl (EYE DROPS OP) Apply 2 drops to eye daily as needed.    Historical Provider, MD  triamterene-hydrochlorothiazide  (DYAZIDE) 37.5-25 MG per capsule Take 1 capsule by mouth every morning.    Historical Provider, MD    Family History Family History  Problem Relation Age of Onset  . COPD Mother   . Lung disease Father     Asbestosis  . Heart attack Father   . Cerebral aneurysm Brother     Social History Social History  Substance Use Topics  . Smoking status: Current Every Day Smoker    Packs/day: 0.50    Years: 40.00    Types: Cigarettes  . Smokeless tobacco: Never Used  . Alcohol use No  Allergies   Aleve [naproxen sodium]; Codeine; Penicillins; and Sulfonamide derivatives   Review of Systems Review of Systems  Musculoskeletal:       Right hip pain  All other systems reviewed and are negative.    Physical Exam Updated Vital Signs BP 104/67   Pulse 73   Temp 98.3 F (36.8 C) (Oral)   Resp 10   Ht '5\' 2"'$  (1.575 m)   Wt 211 lb (95.7 kg)   SpO2 100%   BMI 38.59 kg/m   Physical Exam  Constitutional: She is oriented to person, place, and time. She appears well-developed and well-nourished.  HENT:  Head: Normocephalic and atraumatic.  Right Ear: External ear normal.  Left Ear: External ear normal.  Nose: Nose normal.  Mouth/Throat: Oropharynx is clear and moist.  Eyes: Conjunctivae and EOM are normal. Pupils are equal, round, and reactive to light.  Neck: Normal range of motion. Neck supple.  Cardiovascular: Normal rate, regular rhythm, normal heart sounds and intact distal pulses.   Pulmonary/Chest: Effort normal and breath sounds normal.  Abdominal: Soft. Bowel sounds are normal.  Musculoskeletal:       Right hip: She exhibits deformity.  Neurological: She is alert and oriented to person, place, and time.  Skin: Skin is warm.  Psychiatric: She has a normal mood and affect. Her behavior is normal. Judgment and thought content normal.  Nursing note and vitals reviewed.    ED Treatments / Results  Labs (all labs ordered are listed, but only abnormal results are  displayed) Labs Reviewed - No data to display  EKG  EKG Interpretation None       Radiology Dg Hip Unilat W Or Wo Pelvis 1 View Right  Result Date: 02/16/2016 CLINICAL DATA:  Right hip pain EXAM: DG HIP (WITH OR WITHOUT PELVIS) 1V RIGHT COMPARISON:  02/01/2016 FINDINGS: The patient is status post right hip replacement. There is superior dislocation of the right femoral compliment with respect to the acetabular cup. There is no gross fracture. IMPRESSION: Status post right hip replacement with superior dislocation of the right femoral compliment with respect to the acetabular cup Electronically Signed   By: Donavan Foil M.D.   On: 02/16/2016 21:25    Procedures .Sedation Date/Time: 02/16/2016 9:49 PM Performed by: Isla Pence Authorized by: Isla Pence   Consent:    Consent obtained:  Written   Consent given by:  Patient   Risks discussed:  Prolonged hypoxia resulting in organ damage, inadequate sedation and nausea   Alternatives discussed:  Analgesia without sedation Indications:    Procedure performed:  Dislocation reduction   Procedure necessitating sedation performed by:  Physician performing sedation   Intended level of sedation:  Moderate (conscious sedation) Pre-sedation assessment:    Time since last food or drink:  Hours   Neck mobility: normal     Mouth opening:  3 or more finger widths   Mallampati score:  II - soft palate, uvula, fauces visible   Pre-sedation assessments completed and reviewed: airway patency, cardiovascular function, hydration status, mental status, nausea/vomiting, pain level, respiratory function and temperature     History of difficult intubation: no   Immediate pre-procedure details:    Reassessment: Patient reassessed immediately prior to procedure     Verified: bag valve mask available, emergency equipment available, intubation equipment available, IV patency confirmed, oxygen available, reversal medications available and suction  available   Procedure details (see MAR for exact dosages):    Preoxygenation:  Nasal cannula   Sedation:  Propofol   Analgesia:  Morphine and hydromorphone   Intra-procedure monitoring:  Blood pressure monitoring, cardiac monitor, continuous capnometry, continuous pulse oximetry, frequent LOC assessments and frequent vital sign checks   Intra-procedure events: hypoxia     Intra-procedure management:  Airway repositioning and supplemental oxygen Post-procedure details:    Attendance: Constant attendance by certified staff until patient recovered     Recovery: Patient returned to pre-procedure baseline     Estimated blood loss (see I/O flowsheets): no     Post-sedation assessments completed and reviewed: airway patency, cardiovascular function, hydration status, mental status, nausea/vomiting, pain level, respiratory function and temperature     Specimens recovered:  None   Patient is stable for discharge or admission: yes     Patient tolerance:  Tolerated well, no immediate complications Comments:     Please see nurse's notes for times. Reduction of dislocation Date/Time: 02/16/2016 9:55 PM Performed by: Isla Pence Authorized by: Isla Pence  Consent: Verbal consent obtained. Consent given by: patient Patient understanding: patient states understanding of the procedure being performed Patient consent: the patient's understanding of the procedure matches consent given Procedure consent: procedure consent matches procedure scheduled Relevant documents: relevant documents present and verified Test results: test results available and properly labeled Site marked: the operative site was marked Imaging studies: imaging studies available Required items: required blood products, implants, devices, and special equipment available Patient identity confirmed: verbally with patient Time out: Immediately prior to procedure a "time out" was called to verify the correct patient, procedure,  equipment, support staff and site/side marked as required. Preparation: Patient was prepped and draped in the usual sterile fashion. Local anesthesia used: no  Anesthesia: Local anesthesia used: no  Sedation: Patient sedated: yes Sedation type: moderate (conscious) sedation Sedatives: propofol Analgesia: hydromorphone and morphine Vitals: Vital signs were monitored during sedation. Patient tolerance: Patient tolerated the procedure well with no immediate complications Comments: Please see nurse's notes for times.    (including critical care time)  Medications Ordered in ED Medications  propofol (DIPRIVAN) 10 mg/mL bolus/IV push 40 mg (40 mg Intravenous Given by Other 02/16/16 2126)  HYDROmorphone (DILAUDID) injection 1 mg (1 mg Intravenous Given 02/16/16 2059)   Pt's hip reduced and as we were waiting for x-ray, she woke up and her right leg spasmed and dislocated her hip again.  Pt was given additional propofol which relaxed her enough to reduce the hip again.  We put pt's knee immobilizer on her right away.  Pt is now awake.  She said that her hip feels better.  She knows to call Dr. Alvan Dame on Monday the 22nd.  She knows to keep on her splint and use her walker.  Initial Impression / Assessment and Plan / ED Course  I have reviewed the triage vital signs and the nursing notes.  Pertinent labs & imaging results that were available during my care of the patient were reviewed by me and considered in my medical decision making (see chart for details).     Final Clinical Impressions(s) / ED Diagnoses   Final diagnoses:  Hip dislocation, right, initial encounter [facility])    New Prescriptions New Prescriptions   No medications on file     Isla Pence, MD 02/16/16 2210

## 2016-02-16 NOTE — Sedation Documentation (Signed)
Xray in room to xray hip

## 2016-02-16 NOTE — ED Provider Notes (Addendum)
Pt was seen by Dr Gilford Raid.  Hip reduction procedure performed in the ED.  As patient was getting ready to leave, she did not feel that her hip was in place.  Repeat xray confirms the hip is not reduced.  Reduction attempt in the ED failed.  I will consult with the orthopedist on call.     Dorie Rank, MD 02/16/16 2334  Discussed with Dr Aline Brochure.  He requested I speak with Dr Alvan Dame or someone from his group.  Call placed to their office.  I spoke with Dr Stann Mainland.   Plan will be to transfer the patient to [place] for or reduction of recurrent hip dislocation.   Dorie Rank, MD 02/17/16 [phone]

## 2016-02-16 NOTE — ED Triage Notes (Signed)
Pt with right hip pain since fall this evening.  States that her right hip pops out frequently.

## 2016-02-16 NOTE — Sedation Documentation (Signed)
Additional 20 mg propofol

## 2016-02-16 NOTE — Sedation Documentation (Signed)
Additional 40 mg propofol given IV for sedation

## 2016-02-16 NOTE — Sedation Documentation (Signed)
ED Provider at bedside. 

## 2016-02-16 NOTE — ED Notes (Signed)
Patient transported to X-ray 

## 2016-02-17 ENCOUNTER — Encounter (HOSPITAL_COMMUNITY)
Admission: EM | Disposition: A | Discharge status: Home / Self Care | Payer: Self-pay | Source: Home / Self Care | Attending: Emergency Medicine

## 2016-02-17 ENCOUNTER — Observation Stay (HOSPITAL_COMMUNITY): Payer: BLUE CROSS/BLUE SHIELD | Admitting: Anesthesiology

## 2016-02-17 ENCOUNTER — Observation Stay (HOSPITAL_COMMUNITY): Payer: BLUE CROSS/BLUE SHIELD

## 2016-02-17 ENCOUNTER — Encounter (HOSPITAL_COMMUNITY): Payer: Self-pay | Admitting: Anesthesiology

## 2016-02-17 DIAGNOSIS — J449 Chronic obstructive pulmonary disease, unspecified: Secondary | ICD-10-CM | POA: Diagnosis not present

## 2016-02-17 DIAGNOSIS — Z96649 Presence of unspecified artificial hip joint: Secondary | ICD-10-CM

## 2016-02-17 DIAGNOSIS — I10 Essential (primary) hypertension: Secondary | ICD-10-CM | POA: Diagnosis not present

## 2016-02-17 DIAGNOSIS — Z79899 Other long term (current) drug therapy: Secondary | ICD-10-CM | POA: Diagnosis not present

## 2016-02-17 DIAGNOSIS — T84020A Dislocation of internal right hip prosthesis, initial encounter: Secondary | ICD-10-CM | POA: Diagnosis present

## 2016-02-17 DIAGNOSIS — J309 Allergic rhinitis, unspecified: Secondary | ICD-10-CM | POA: Diagnosis not present

## 2016-02-17 DIAGNOSIS — T84029A Dislocation of unspecified internal joint prosthesis, initial encounter: Secondary | ICD-10-CM

## 2016-02-17 DIAGNOSIS — Y831 Surgical operation with implant of artificial internal device as the cause of abnormal reaction of the patient, or of later complication, without mention of misadventure at the time of the procedure: Secondary | ICD-10-CM | POA: Diagnosis not present

## 2016-02-17 DIAGNOSIS — F17210 Nicotine dependence, cigarettes, uncomplicated: Secondary | ICD-10-CM | POA: Diagnosis not present

## 2016-02-17 DIAGNOSIS — F329 Major depressive disorder, single episode, unspecified: Secondary | ICD-10-CM | POA: Diagnosis not present

## 2016-02-17 DIAGNOSIS — Z9981 Dependence on supplemental oxygen: Secondary | ICD-10-CM | POA: Diagnosis not present

## 2016-02-17 DIAGNOSIS — F419 Anxiety disorder, unspecified: Secondary | ICD-10-CM | POA: Diagnosis not present

## 2016-02-17 DIAGNOSIS — E039 Hypothyroidism, unspecified: Secondary | ICD-10-CM | POA: Diagnosis not present

## 2016-02-17 DIAGNOSIS — Z7951 Long term (current) use of inhaled steroids: Secondary | ICD-10-CM | POA: Diagnosis not present

## 2016-02-17 DIAGNOSIS — K219 Gastro-esophageal reflux disease without esophagitis: Secondary | ICD-10-CM | POA: Diagnosis not present

## 2016-02-17 HISTORY — PX: HIP CLOSED REDUCTION: SHX983

## 2016-02-17 SURGERY — CLOSED REDUCTION, HIP
Anesthesia: General | Site: Hip | Laterality: Right

## 2016-02-17 MED ORDER — HYDROCODONE-ACETAMINOPHEN 5-325 MG PO TABS: 1.0000 | ORAL_TABLET | ORAL | Status: DC | PRN

## 2016-02-17 MED ORDER — METHOCARBAMOL 500 MG PO TABS: 500.0000 mg | ORAL_TABLET | Freq: Four times a day (QID) | ORAL | Status: DC | PRN

## 2016-02-17 MED ORDER — PROPOFOL 10 MG/ML IV BOLUS
INTRAVENOUS | Status: AC
  Filled 2016-02-17: qty 20

## 2016-02-17 MED ORDER — METHOCARBAMOL 1000 MG/10ML IJ SOLN: 500.0000 mg | Freq: Four times a day (QID) | INTRAVENOUS | Status: DC | PRN

## 2016-02-17 MED ORDER — METOCLOPRAMIDE HCL 5 MG PO TABS: 5.0000 mg | ORAL_TABLET | Freq: Three times a day (TID) | ORAL | Status: DC | PRN

## 2016-02-17 MED ORDER — METOCLOPRAMIDE HCL 5 MG/ML IJ SOLN: 5.0000 mg | Freq: Three times a day (TID) | INTRAMUSCULAR | Status: DC | PRN

## 2016-02-17 MED ORDER — HYDROCODONE-ACETAMINOPHEN 10-325 MG PO TABS: 1.0000 | ORAL_TABLET | ORAL | Status: DC | PRN

## 2016-02-17 MED ORDER — HYDROMORPHONE HCL 1 MG/ML IJ SOLN: 0.2500 mg | INTRAMUSCULAR | Status: DC | PRN

## 2016-02-17 MED ORDER — ONDANSETRON HCL 4 MG PO TABS: 4.0000 mg | ORAL_TABLET | Freq: Four times a day (QID) | ORAL | Status: DC | PRN

## 2016-02-17 MED ORDER — MORPHINE SULFATE (PF) 4 MG/ML IV SOLN
4.0000 mg | Freq: Once | INTRAVENOUS | Status: AC
  Administered 2016-02-17: 4 mg via INTRAVENOUS
  Filled 2016-02-17: qty 1

## 2016-02-17 MED ORDER — OXYCODONE HCL 5 MG/5ML PO SOLN: 5.0000 mg | Freq: Once | ORAL | Status: DC | PRN

## 2016-02-17 MED ORDER — MEPERIDINE HCL 25 MG/ML IJ SOLN: 6.2500 mg | INTRAMUSCULAR | Status: DC | PRN

## 2016-02-17 MED ORDER — DEXTROSE 5 % IV SOLN: 500.0000 mg | Freq: Four times a day (QID) | INTRAVENOUS | Status: DC | PRN

## 2016-02-17 MED ORDER — ONDANSETRON HCL 4 MG/2ML IJ SOLN: 4.0000 mg | Freq: Four times a day (QID) | INTRAMUSCULAR | Status: DC | PRN

## 2016-02-17 MED ORDER — ONDANSETRON HCL 4 MG/2ML IJ SOLN
INTRAMUSCULAR | Status: DC | PRN
  Administered 2016-02-17: 4 mg via INTRAVENOUS

## 2016-02-17 MED ORDER — ACETAMINOPHEN 325 MG PO TABS: 650.0000 mg | ORAL_TABLET | Freq: Four times a day (QID) | ORAL | Status: DC | PRN

## 2016-02-17 MED ORDER — OXYCODONE HCL 5 MG PO TABS: 5.0000 mg | ORAL_TABLET | ORAL | Status: DC | PRN

## 2016-02-17 MED ORDER — LACTATED RINGERS IV SOLN
INTRAVENOUS | Status: DC
  Administered 2016-02-17: 11:00:00 via INTRAVENOUS

## 2016-02-17 MED ORDER — ONDANSETRON HCL 4 MG/2ML IJ SOLN
INTRAMUSCULAR | Status: AC
  Filled 2016-02-17: qty 2

## 2016-02-17 MED ORDER — LIDOCAINE 2% (20 MG/ML) 5 ML SYRINGE
INTRAMUSCULAR | Status: AC
  Filled 2016-02-17: qty 5

## 2016-02-17 MED ORDER — LIDOCAINE 2% (20 MG/ML) 5 ML SYRINGE
INTRAMUSCULAR | Status: DC | PRN
  Administered 2016-02-17: 50 mg via INTRAVENOUS

## 2016-02-17 MED ORDER — POVIDONE-IODINE 10 % EX SWAB: 2.0000 "application " | Freq: Once | CUTANEOUS | Status: DC

## 2016-02-17 MED ORDER — PROMETHAZINE HCL 25 MG/ML IJ SOLN: 6.2500 mg | INTRAMUSCULAR | Status: DC | PRN

## 2016-02-17 MED ORDER — OXYCODONE HCL 5 MG PO TABS: 5.0000 mg | ORAL_TABLET | Freq: Once | ORAL | Status: DC | PRN

## 2016-02-17 MED ORDER — LACTATED RINGERS IV SOLN: INTRAVENOUS | Status: DC

## 2016-02-17 MED ORDER — CHLORHEXIDINE GLUCONATE 4 % EX LIQD: 60.0000 mL | Freq: Once | CUTANEOUS | Status: DC

## 2016-02-17 MED ORDER — LACTATED RINGERS IV SOLN
INTRAVENOUS | Status: DC | PRN
  Administered 2016-02-17: 13:00:00 via INTRAVENOUS

## 2016-02-17 MED ORDER — HYDROMORPHONE HCL 1 MG/ML IJ SOLN
1.0000 mg | Freq: Once | INTRAMUSCULAR | Status: AC
  Administered 2016-02-17: 1 mg via INTRAVENOUS
  Filled 2016-02-17: qty 1

## 2016-02-17 MED ORDER — ACETAMINOPHEN 650 MG RE SUPP: 650.0000 mg | Freq: Four times a day (QID) | RECTAL | Status: DC | PRN

## 2016-02-17 MED ORDER — PROPOFOL 10 MG/ML IV BOLUS
INTRAVENOUS | Status: DC | PRN
  Administered 2016-02-17: 150 mg via INTRAVENOUS
  Administered 2016-02-17: 20 mg via INTRAVENOUS
  Administered 2016-02-17: 30 mg via INTRAVENOUS

## 2016-02-17 MED ORDER — MORPHINE SULFATE (PF) 2 MG/ML IV SOLN
2.0000 mg | INTRAVENOUS | Status: DC | PRN
  Administered 2016-02-17 (×3): 2 mg via INTRAVENOUS
  Filled 2016-02-17 (×3): qty 1

## 2016-02-17 SURGICAL SUPPLY — 1 items: DRSG TEGADERM 2-3/8X2-3/4 SM (GAUZE/BANDAGES/DRESSINGS) ×3 IMPLANT

## 2016-02-17 NOTE — H&P (Signed)
ORTHOPAEDIC H&P  REQUESTING PHYSICIAN: Nicholes Stairs, MD  PCP:  Deloria Lair, MD  Chief Complaint: Dislocated right total hip arthroplasty  HPI: [name] is a 61 y.o. female underwent revision right total hip arthroplasty for metal-on-metal bearing surface by Dr. Alvan Dame on 01/01/2016. Since the surgery, the patient tells me that she has dislocated her hip 4 times now. She has required closed reduction in the operating room each time. The patient tells me that she "did a dance" yesterday and her hip popped out. She went to [facility] due to inability to weight-bear. X-rays showed dislocated right total hip arthroplasty. She had a failed attempt at closed reduction by the emergency department staff. She was transferred from [facility] for management of this issue. She denies other injury.  Past Medical History:  Diagnosis Date  . Allergic rhinitis   . Anemia   . Anxiety   . Chronic back pain   . COPD (chronic obstructive pulmonary disease) ([place])   . Depression   . Essential hypertension   . GERD (gastroesophageal reflux disease)   . Headache    migraines  . History of gastritis    EGD 2015  . History of home oxygen therapy    2 liters at hs last 6 months  . Hypothyroidism   . Osteoarthritis    oa  . Scoliosis    Past Surgical History:  Procedure Laterality Date  . APPENDECTOMY     1985  . CARDIAC CATHETERIZATION N/A 10/31/2015   Procedure: Left Heart Cath and Coronary Angiography;  Surgeon: Leonie Man, MD;  Location: [place] [facility];  Service: Cardiovascular;  Laterality: N/A;  . CARPAL TUNNEL RELEASE Left   . CARPAL TUNNEL RELEASE Right   . CHOLECYSTECTOMY  late 1980's  . HIP CLOSED REDUCTION Right 01/08/2016   Procedure: CLOSED MANIPULATION HIP;  Surgeon: Susa Day, MD;  Location: [facility];  Service: Orthopedics;  Laterality: Right;  . HIP CLOSED REDUCTION Right 01/19/2016   Procedure: ATTEMPTED CLOSED REDUCTION RIGHT HIP;   Surgeon: Wylene Simmer, MD;  Location: [facility];  Service: Orthopedics;  Laterality: Right;  . HIP CLOSED REDUCTION Right 01/20/2016   Procedure: CLOSED REDUCTION RIGHT TOTAL HIP;  Surgeon: Paralee Cancel, MD;  Location: [facility];  Service: Orthopedics;  Laterality: Right;  . TONSILLECTOMY    . TOTAL ABDOMINAL HYSTERECTOMY     1985, with 1 ovary removed and 2 nd ovary removed 2003  . TOTAL HIP ARTHROPLASTY Right    Original surgery 2006 with revision 2010  . TOTAL HIP REVISION Right 01/01/2016   Procedure: TOTAL HIP REVISION;  Surgeon: Paralee Cancel, MD;  Location: [facility];  Service: Orthopedics;  Laterality: Right;  . ULNAR NERVE TRANSPOSITION Right    Social History   Social History  . Marital status: Married    Spouse name: N/A  . Number of children: N/A  . Years of education: N/A   Social History Main Topics  . Smoking status: Current Every Day Smoker    Packs/day: 0.50    Years: 40.00    Types: Cigarettes  . Smokeless tobacco: Never Used  . Alcohol use No  . Drug use: No  . Sexual activity: Not Asked   Other Topics Concern  . None   Social History Narrative  . None   Family History  Problem Relation Age of Onset  . COPD Mother   . Lung disease Father     Asbestosis  . Heart attack Father   .  Cerebral aneurysm Brother    Allergies  Allergen Reactions  . Aleve [Naproxen Sodium] Other (See Comments)    Headache   . Codeine     REACTION: GI upset  . Penicillins Other (See Comments)    GI upset Has patient had a PCN reaction causing immediate rash, facial/tongue/throat swelling, SOB or lightheadedness with hypotension: No Has patient had a PCN reaction causing severe rash involving mucus membranes or skin necrosis: No Has patient had a PCN reaction that required hospitalization No Has patient had a PCN reaction occurring within the last 10 years: No If all of the above answers are "NO", then may proceed with Cephalosporin use.   . Sulfonamide Derivatives Hives    Prior to Admission medications   Medication Sig Start Date End Date Taking? Authorizing Provider  ALPRAZolam Duanne Moron) 1 MG tablet Take 1 mg by mouth 4 (four) times daily.   Yes Historical Provider, MD  Aspirin-Acetaminophen-Caffeine (GOODY HEADACHE PO) Take 1 packet by mouth daily as needed.   Yes Historical Provider, MD  atorvastatin (LIPITOR) 20 MG tablet Take 20 mg by mouth at bedtime.    Yes Historical Provider, MD  fluticasone (FLONASE) 50 MCG/ACT nasal spray Place 2 sprays into both nostrils daily.   Yes Historical Provider, MD  HYDROcodone-acetaminophen (NORCO) 10-325 MG tablet Take 1-2 tablets by mouth every 4 (four) hours as needed. Patient taking differently: Take 1-2 tablets by mouth every 4 (four) hours as needed for moderate pain or severe pain.  01/02/16  Yes Danae Orleans, PA-C  levothyroxine (SYNTHROID, LEVOTHROID) 75 MCG tablet Take 75 mcg by mouth daily before breakfast.   Yes Historical Provider, MD  loratadine (CLARITIN) 10 MG tablet Take 10 mg by mouth daily.   Yes Historical Provider, MD  methocarbamol (ROBAXIN) 500 MG tablet Take 1 tablet (500 mg total) by mouth every 6 (six) hours as needed for muscle spasms. 01/02/16  Yes Danae Orleans, PA-C  omeprazole (PRILOSEC) 20 MG capsule Take 20 mg by mouth daily.   Yes Historical Provider, MD  OXYGEN Inhale into the lungs. 2 liters at hs   Yes Historical Provider, MD  potassium chloride (K-DUR) 10 MEQ tablet Take 10 mEq by mouth at bedtime.    Yes Historical Provider, MD  ranitidine (ZANTAC) 150 MG capsule Take 300 mg by mouth at bedtime.    Yes Historical Provider, MD  sertraline (ZOLOFT) 100 MG tablet Take 150 mg by mouth at bedtime.    Yes Historical Provider, MD  Tetrahydrozoline HCl (EYE DROPS OP) Apply 2 drops to eye daily as needed.   Yes Historical Provider, MD  triamterene-hydrochlorothiazide (DYAZIDE) 37.5-25 MG per capsule Take 1 capsule by mouth every morning.   Yes Historical Provider, MD  aspirin 81 MG chewable  tablet Chew 1 tablet (81 mg total) by mouth 2 (two) times daily. Take for 4 weeks. Patient not taking: Reported on 02/17/2016 01/02/16   Danae Orleans, PA-C  docusate sodium (COLACE) 100 MG capsule Take 1 capsule (100 mg total) by mouth 2 (two) times daily. Patient not taking: Reported on 02/17/2016 01/02/16   Danae Orleans, PA-C  ferrous sulfate 325 (65 FE) MG tablet Take 1 tablet (325 mg total) by mouth 3 (three) times daily after meals. Patient not taking: Reported on 01/08/2016 01/02/16   Danae Orleans, PA-C  nitroGLYCERIN (NITROSTAT) 0.4 MG SL tablet Place 1 tablet (0.4 mg total) under the tongue every 5 (five) minutes as needed for chest pain. Patient not taking: Reported on 01/08/2016 10/26/15 01/24/16  Curt Bears  Brooks Sailors, NP  polyethylene glycol (MIRALAX / GLYCOLAX) packet Take 17 g by mouth 2 (two) times daily. Patient not taking: Reported on 01/08/2016 01/02/16   Danae Orleans, PA-C   Dg Hip Unilat W Or Wo Pelvis 1 View Right  Result Date: 02/16/2016 CLINICAL DATA:  Right hip pain EXAM: DG HIP (WITH OR WITHOUT PELVIS) 1V RIGHT COMPARISON:  02/01/2016 FINDINGS: The patient is status post right hip replacement. There is superior dislocation of the right femoral compliment with respect to the acetabular cup. There is no gross fracture. IMPRESSION: Status post right hip replacement with superior dislocation of the right femoral compliment with respect to the acetabular cup Electronically Signed   By: Donavan Foil M.D.   On: 02/16/2016 21:25   Dg Hip Port Unilat W Or Wo Pelvis 1 View Right  Result Date: 02/16/2016 CLINICAL DATA:  Right hip reduction for dislocation EXAM: DG HIP (WITH OR WITHOUT PELVIS) 1V PORT RIGHT COMPARISON:  None. FINDINGS: The femoral head prosthetic projects over the acetabular component eccentrically along its upper outer are aspect and may still be dislocated given this single frontal projection only. Cross-table lateral may help for further evaluation. IMPRESSION:  Eccentrically situated prosthetic femoral head relative to the acetabular component. It is uncertain whether there is still a dislocation of the femoral head based on this single view without an orthogonal projection. Recommend a cross-table or frog-leg view of the right hip. Electronically Signed   By: Ashley Royalty M.D.   On: 02/16/2016 22:11   Dg Hip Unilat W Or Wo Pelvis 2-3 Views Right  Result Date: 02/16/2016 CLINICAL DATA:  Right hip pain, hip dislocation EXAM: DG HIP (WITH OR WITHOUT PELVIS) 2-3V RIGHT COMPARISON:  Study performed at 2140 hours on the same day FINDINGS: The patient is status post right hip arthroplasty. There is dislocation of the femoral head relative to the acetabular component. The femoral head is situated cephalad, lateral and anterior to the acetabular cup. No fracture is identified. IMPRESSION: Dislocated right hip arthroplasty with femoral head component situated cephalad, lateral and anterior to the acetabular component. Electronically Signed   By: Ashley Royalty M.D.   On: 02/16/2016 23:27    Positive ROS: All other systems have been reviewed and were otherwise negative with the exception of those mentioned in the HPI and as above.  Physical Exam: General: Alert, no acute distress Cardiovascular: No pedal edema Respiratory: No cyanosis, no use of accessory musculature GI: No organomegaly, abdomen is soft and non-tender Skin: No lesions in the area of chief complaint Neurologic: Sensation intact distally Psychiatric: Patient is competent for consent with normal mood and affect Lymphatic: No axillary or cervical lymphadenopathy  MUSCULOSKELETAL: Examination of the right hip reveals a healed incision. The extremity is shortened and rotated. She is neurovascularly intact.  Assessment: Dislocated right total hip arthroplasty, fourth occurrence  Plan: I discussed the findings with the patient and her family. I recommended closed reduction of the right hip in the  operating room. After the procedure we will put her in a knee immobilizer and then discharge her from the PACU. I instructed her to call the office on Monday morning to schedule an appointment with Dr. Alvan Dame.    Django Nguyen, Horald Pollen, MD Cell [phone]    02/17/2016 12:24 PM

## 2016-02-17 NOTE — Brief Op Note (Signed)
02/17/2016  1:55 PM  PATIENT:  [name]  61 y.o. female  PRE-OPERATIVE DIAGNOSIS:  right dislocated total hip  POST-OPERATIVE DIAGNOSIS:  right dislocated total hip  PROCEDURE:  Procedure(s): CLOSED REDUCTION RIGHT TOTAL HIP (Right)  SURGEON:  Surgeon(s) and Role:    * Rod Can, MD - Primary  PHYSICIAN ASSISTANT: none  ASSISTANTS: staff   ANESTHESIA:   MAC  EBL:  No intake/output data recorded.  BLOOD ADMINISTERED:none  DRAINS: none   LOCAL MEDICATIONS USED:  NONE  SPECIMEN:  No Specimen  DISPOSITION OF SPECIMEN:  N/A  COUNTS:  YES  TOURNIQUET:  * No tourniquets in log *  DICTATION: .Other Dictation: Dictation Number [id number]  PLAN OF CARE: Discharge to home after PACU  PATIENT DISPOSITION:  PACU - hemodynamically stable.   Delay start of Pharmacological VTE agent (>24hrs) due to surgical blood loss or risk of bleeding: not applicable

## 2016-02-17 NOTE — ED Notes (Signed)
Report given to [facility]. 

## 2016-02-17 NOTE — Transfer of Care (Signed)
Immediate Anesthesia Transfer of Care Note  Patient: [name]  Procedure(s) Performed: Procedure(s): CLOSED REDUCTION RIGHT TOTAL HIP (Right)  Patient Location: PACU  Anesthesia Type:General  Level of Consciousness: awake  Airway & Oxygen Therapy: Patient Spontanous Breathing and Patient connected to nasal cannula oxygen  Post-op Assessment: Report given to RN and Post -op Vital signs reviewed and stable  Post vital signs: Reviewed and stable  Last Vitals:  Vitals:   02/17/16 0900 02/17/16 0954  BP: 105/65 122/64  Pulse: 89 92  Resp:  19  Temp:      Last Pain:  Vitals:   02/17/16 0956  TempSrc:   PainSc: 4          Complications: No apparent anesthesia complications

## 2016-02-17 NOTE — Anesthesia Postprocedure Evaluation (Addendum)
Anesthesia Post Note  Patient: [name]  Procedure(s) Performed: Procedure(s) (LRB): CLOSED REDUCTION RIGHT TOTAL HIP (Right)  Patient location during evaluation: PACU Anesthesia Type: General Level of consciousness: awake and alert Pain management: pain level controlled Vital Signs Assessment: post-procedure vital signs reviewed and stable Respiratory status: spontaneous breathing, nonlabored ventilation, respiratory function stable and patient connected to nasal cannula oxygen Cardiovascular status: blood pressure returned to baseline and stable Postop Assessment: no signs of nausea or vomiting Anesthetic complications: no       Last Vitals:  Vitals:   02/17/16 1432 02/17/16 1447  BP: 104/60 (!) 104/59  Pulse: 84 92  Resp: 20 16  Temp:  36.6 C    Last Pain:  Vitals:   02/17/16 0956  TempSrc:   PainSc: [place]

## 2016-02-17 NOTE — Anesthesia Preprocedure Evaluation (Addendum)
Anesthesia Evaluation  Patient identified by MRN, date of birth, ID band Patient awake    Reviewed: Allergy & Precautions, NPO status , Patient's Chart, lab work & pertinent test results  Airway Mallampati: I  TM Distance: >3 FB Neck ROM: Full    Dental  (+) Teeth Intact, Dental Advisory Given   Pulmonary COPD, Current Smoker,    breath sounds clear to auscultation       Cardiovascular hypertension, Pt. on medications  Rhythm:Regular Rate:Normal     Neuro/Psych  Headaches, PSYCHIATRIC DISORDERS Anxiety Depression    GI/Hepatic Neg liver ROS, GERD  Medicated,  Endo/Other  Hypothyroidism   Renal/GU negative Renal ROS     Musculoskeletal  (+) Arthritis , Osteoarthritis,    Abdominal   Peds  Hematology negative hematology ROS (+)   Anesthesia Other Findings   Reproductive/Obstetrics negative OB ROS                            Lab Results  Component Value Date   WBC 7.7 01/19/2016   HGB 9.9 (L) 01/19/2016   HCT 32.0 (L) 01/19/2016   MCV 83.8 01/19/2016   PLT 337 01/19/2016   Lab Results  Component Value Date   CREATININE 0.84 01/19/2016   BUN 19 01/19/2016   NA 141 01/19/2016   K 3.8 01/19/2016   CL 110 01/19/2016   CO2 23 01/19/2016   Lab Results  Component Value Date   INR 0.92 10/27/2015   INR 0.9 03/10/2008   EKG: normal sinus rhythm.  Anesthesia Physical Anesthesia Plan  ASA: III  Anesthesia Plan: General   Post-op Pain Management:    Induction: Intravenous  Airway Management Planned: Natural Airway and Mask  Additional Equipment:   Intra-op Plan:   Post-operative Plan:   Informed Consent: I have reviewed the patients History and Physical, chart, labs and discussed the procedure including the risks, benefits and alternatives for the proposed anesthesia with the patient or authorized representative who has indicated his/her understanding and acceptance.      Plan Discussed with: CRNA  Anesthesia Plan Comments:         Anesthesia Quick Evaluation

## 2016-02-17 NOTE — Discharge Instructions (Signed)
Anterior hip precautions Wear knee immobilizer

## 2016-02-18 ENCOUNTER — Encounter (HOSPITAL_COMMUNITY): Payer: Self-pay | Admitting: Orthopedic Surgery

## 2016-02-18 NOTE — Op Note (Signed)
NAMECHESSICA, AUDIA NO.:  [id number]  MEDICAL RECORD NO.:  [id number]  LOCATION:  [facility]                         FACILITY:  [facility]  PHYSICIAN:  Rod Can, MD     DATE OF BIRTH:  1955-11-20  DATE OF PROCEDURE:  02/17/2016 DATE OF DISCHARGE:                              OPERATIVE REPORT   PREOPERATIVE DIAGNOSIS:  Dislocated right total hip arthroplasty.  POSTOPERATIVE DIAGNOSIS:  Dislocated right total hip arthroplasty.  PROCEDURE PERFORMED:  Closed reduction of dislocated right total hip arthroplasty.  ANESTHESIA:  MAC.  ESTIMATED BLOOD LOSS:  Zero.  ANTIBIOTICS:  None were indicated.  COMPLICATIONS:  None.  DISPOSITION:  Stable to PACU.  INDICATIONS:  The patient is a 61 year old female, who underwent revision right total hip arthroplasty by Dr. Paralee Cancel on January 01, 2016, for a metal-on-metal bearing surface.  The patient has had 4 episodes of dislocation including this most recent one.  She was going outside to throw her dog a bone and her hip dislocated.  She went to the emergency department at [facility] where x-ray showed dislocated right total hip replacement.  She was then transferred to [facility] for management. We discussed the risks, benefits, and alternatives to closed reduction of the right hip.  She elected to proceed.  DESCRIPTION OF PROCEDURE:  I identified the patient in the holding area. I marked the surgical site.  She was taken to the operating room.  MAC was administered on the stretcher.  I reduced the hip with longitudinal traction, internal rotation as this was an anterior dislocation.  The reduction was actually fairly difficult to obtain.  There was an audible and palpable clunk.  A portable pelvis x-ray in the operating room confirmed reduction.  She was then aroused from anesthesia, and taken to the PACU in stable condition.  Antibiotics were not given as none were indicated.  We will discharge the patient from the  PACU.  She will continue wearing the knee immobilizer.  She will follow up with Dr. Alvan Dame.  All questions were answered.          ______________________________ Rod Can, MD     BS/MEDQ  D:  02/17/2016  T:  02/18/2016  Job:  [id number]

## 2016-02-22 NOTE — Discharge Summary (Signed)
Physician Discharge Summary  Patient ID: [name] MRN: [id number] DOB/AGE: Nov 16, 1955 61 y.o.  Admit date: 02/16/2016 Discharge date: 02/22/2016  Admission Diagnoses:  Dislocated right total hip arthroplasty  Discharge Diagnoses:  Active Problems:   Dislocation of hip prosthesis [facility])   Past Medical History:  Diagnosis Date  . Allergic rhinitis   . Anemia   . Anxiety   . Chronic back pain   . COPD (chronic obstructive pulmonary disease) ([place])   . Depression   . Essential hypertension   . GERD (gastroesophageal reflux disease)   . Headache    migraines  . History of gastritis    EGD 2015  . History of home oxygen therapy    2 liters at hs last 6 months  . Hypothyroidism   . Osteoarthritis    oa  . Scoliosis     Surgeries: Procedure(s): CLOSED REDUCTION RIGHT TOTAL HIP on 02/16/2016 - 02/17/2016   Consultants (if any):   Discharged Condition: Improved  Hospital Course: [name] is an 61 y.o. female who was admitted 02/16/2016 with a diagnosis of Dislocated right total hip arthroplasty and went to the operating room on 02/17/2016 and underwent the above named procedures.    She was given perioperative antibiotics:  Anti-infectives    None    .  She benefited maximally from the hospital stay and there were no complications.    Recent vital signs:  Vitals:   02/17/16 1432 02/17/16 1447  BP: 104/60 (!) 104/59  Pulse: 84 92  Resp: 20 16  Temp:  97.8 F (36.6 C)    Recent laboratory studies:  Lab Results  Component Value Date   HGB 9.9 (L) 01/19/2016   HGB 10.3 (L) 01/02/2016   HGB 11.3 (L) 12/25/2015   Lab Results  Component Value Date   WBC 7.7 01/19/2016   PLT 337 01/19/2016   Lab Results  Component Value Date   INR 0.92 10/27/2015   Lab Results  Component Value Date   NA 141 01/19/2016   K 3.8 01/19/2016   CL 110 01/19/2016   CO2 23 01/19/2016   BUN 19 01/19/2016   CREATININE 0.84 01/19/2016   GLUCOSE 91 01/19/2016     Discharge Medications:   Allergies as of 02/17/2016      Reactions   Aleve [naproxen Sodium] Other (See Comments)   Headache   Codeine    REACTION: GI upset   Penicillins Other (See Comments)   GI upset Has patient had a PCN reaction causing immediate rash, facial/tongue/throat swelling, SOB or lightheadedness with hypotension: No Has patient had a PCN reaction causing severe rash involving mucus membranes or skin necrosis: No Has patient had a PCN reaction that required hospitalization No Has patient had a PCN reaction occurring within the last 10 years: No If all of the above answers are "NO", then may proceed with Cephalosporin use.   Sulfonamide Derivatives Hives      Medication List    TAKE these medications   ALPRAZolam 1 MG tablet Commonly known as:  XANAX Take 1 mg by mouth 4 (four) times daily.   aspirin 81 MG chewable tablet Chew 1 tablet (81 mg total) by mouth 2 (two) times daily. Take for 4 weeks.   atorvastatin 20 MG tablet Commonly known as:  LIPITOR Take 20 mg by mouth at bedtime.   docusate sodium 100 MG capsule Commonly known as:  COLACE Take 1 capsule (100 mg total) by mouth 2 (two) times daily.  EYE DROPS OP Apply 2 drops to eye daily as needed.   ferrous sulfate 325 (65 FE) MG tablet Take 1 tablet (325 mg total) by mouth 3 (three) times daily after meals.   fluticasone 50 MCG/ACT nasal spray Commonly known as:  FLONASE Place 2 sprays into both nostrils daily.   GOODY HEADACHE PO Take 1 packet by mouth daily as needed.   HYDROcodone-acetaminophen 10-325 MG tablet Commonly known as:  NORCO Take 1-2 tablets by mouth every 4 (four) hours as needed. What changed:  reasons to take this   levothyroxine 75 MCG tablet Commonly known as:  SYNTHROID, LEVOTHROID Take 75 mcg by mouth daily before breakfast.   loratadine 10 MG tablet Commonly known as:  CLARITIN Take 10 mg by mouth daily.   methocarbamol 500 MG tablet Commonly known as:   ROBAXIN Take 1 tablet (500 mg total) by mouth every 6 (six) hours as needed for muscle spasms.   nitroGLYCERIN 0.4 MG SL tablet Commonly known as:  NITROSTAT Place 1 tablet (0.4 mg total) under the tongue every 5 (five) minutes as needed for chest pain.   omeprazole 20 MG capsule Commonly known as:  PRILOSEC Take 20 mg by mouth daily.   OXYGEN Inhale into the lungs. 2 liters at hs   polyethylene glycol packet Commonly known as:  MIRALAX / GLYCOLAX Take 17 g by mouth 2 (two) times daily.   potassium chloride 10 MEQ tablet Commonly known as:  K-DUR Take 10 mEq by mouth at bedtime.   ranitidine 150 MG capsule Commonly known as:  ZANTAC Take 300 mg by mouth at bedtime.   sertraline 100 MG tablet Commonly known as:  ZOLOFT Take 150 mg by mouth at bedtime.   triamterene-hydrochlorothiazide 37.5-25 MG capsule Commonly known as:  DYAZIDE Take 1 capsule by mouth every morning.       Diagnostic Studies: Ct Head Wo Contrast  Result Date: 02/01/2016 CLINICAL DATA:  Status post loss of consciousness foot fall and trauma to the posterior head. EXAM: CT HEAD WITHOUT CONTRAST CT CERVICAL SPINE WITHOUT CONTRAST TECHNIQUE: Multidetector CT imaging of the head and cervical spine was performed following the standard protocol without intravenous contrast. Multiplanar CT image reconstructions of the cervical spine were also generated. COMPARISON:  None. FINDINGS: CT HEAD FINDINGS Brain: No evidence of acute infarction, hemorrhage, hydrocephalus, extra-axial collection or mass lesion/mass effect. Vascular: No hyperdense vessel or unexpected calcification. Skull: Normal. Negative for fracture or focal lesion. Sinuses/Orbits: No acute finding. Other: None. CT CERVICAL SPINE FINDINGS Alignment: Normal. Skull base and vertebrae: No acute fracture. No primary bone lesion or focal pathologic process. Soft tissues and spinal canal: No prevertebral fluid or swelling. No visible canal hematoma. Disc levels:  Multilevel osteoarthritic changes, worse at C6-C7, where there is a large disc osteophyte complex. Upper chest: Negative. Other: None. IMPRESSION: No evidence of acute intracranial injury. No evidence of acute injury to the cervical spine. Osteoarthritic changes of the cervical spine, worse at C6-C7. Electronically Signed   By: Fidela Salisbury M.D.   On: 02/01/2016 20:22   Ct Cervical Spine Wo Contrast  Result Date: 02/01/2016 CLINICAL DATA:  Status post loss of consciousness foot fall and trauma to the posterior head. EXAM: CT HEAD WITHOUT CONTRAST CT CERVICAL SPINE WITHOUT CONTRAST TECHNIQUE: Multidetector CT imaging of the head and cervical spine was performed following the standard protocol without intravenous contrast. Multiplanar CT image reconstructions of the cervical spine were also generated. COMPARISON:  None. FINDINGS: CT HEAD FINDINGS Brain: No evidence  of acute infarction, hemorrhage, hydrocephalus, extra-axial collection or mass lesion/mass effect. Vascular: No hyperdense vessel or unexpected calcification. Skull: Normal. Negative for fracture or focal lesion. Sinuses/Orbits: No acute finding. Other: None. CT CERVICAL SPINE FINDINGS Alignment: Normal. Skull base and vertebrae: No acute fracture. No primary bone lesion or focal pathologic process. Soft tissues and spinal canal: No prevertebral fluid or swelling. No visible canal hematoma. Disc levels: Multilevel osteoarthritic changes, worse at C6-C7, where there is a large disc osteophyte complex. Upper chest: Negative. Other: None. IMPRESSION: No evidence of acute intracranial injury. No evidence of acute injury to the cervical spine. Osteoarthritic changes of the cervical spine, worse at C6-C7. Electronically Signed   By: Fidela Salisbury M.D.   On: 02/01/2016 20:22   Dg Pelvis Portable  Result Date: 02/17/2016 CLINICAL DATA:  Closed reduction of right hip replacement EXAM: PORTABLE PELVIS 1-2 VIEWS COMPARISON:  January 20, 2016  FINDINGS: The initial image demonstrates dislocation of the right hip. The subsequent image demonstrates the femoral head overlying the acetabulum. IMPRESSION: Closed hip reduction as above. Electronically Signed   By: Dorise Bullion III M.D   On: 02/17/2016 15:42   Dg Knee Complete 4 Views Right  Result Date: 02/01/2016 CLINICAL DATA:  Fall with pain EXAM: RIGHT KNEE - COMPLETE 4+ VIEW COMPARISON:  None. FINDINGS: No fracture or dislocation is visualized. Mild to moderate medial and lateral joint space compartment narrowing with small osteophytes. Faint lateral and medial joint space calcifications. IMPRESSION: 1. No acute osseous abnormality 2. Mild to moderate degenerative changes 3. Chondrocalcinosis Electronically Signed   By: Donavan Foil M.D.   On: 02/01/2016 20:10   Dg Hip Unilat W Or Wo Pelvis 1 View Right  Result Date: 02/16/2016 CLINICAL DATA:  Right hip pain EXAM: DG HIP (WITH OR WITHOUT PELVIS) 1V RIGHT COMPARISON:  02/01/2016 FINDINGS: The patient is status post right hip replacement. There is superior dislocation of the right femoral compliment with respect to the acetabular cup. There is no gross fracture. IMPRESSION: Status post right hip replacement with superior dislocation of the right femoral compliment with respect to the acetabular cup Electronically Signed   By: Donavan Foil M.D.   On: 02/16/2016 21:25   Dg Hip Port Unilat W Or Wo Pelvis 1 View Right  Result Date: 02/16/2016 CLINICAL DATA:  Right hip reduction for dislocation EXAM: DG HIP (WITH OR WITHOUT PELVIS) 1V PORT RIGHT COMPARISON:  None. FINDINGS: The femoral head prosthetic projects over the acetabular component eccentrically along its upper outer are aspect and may still be dislocated given this single frontal projection only. Cross-table lateral may help for further evaluation. IMPRESSION: Eccentrically situated prosthetic femoral head relative to the acetabular component. It is uncertain whether there is still a  dislocation of the femoral head based on this single view without an orthogonal projection. Recommend a cross-table or frog-leg view of the right hip. Electronically Signed   By: Ashley Royalty M.D.   On: 02/16/2016 22:11   Dg Hip Port Unilat W Or Wo Pelvis 1 View Right  Result Date: 02/01/2016 CLINICAL DATA:  Right hip reduction after fall. EXAM: DG HIP (WITH OR WITHOUT PELVIS) 1V PORT RIGHT COMPARISON:  02/01/2016 radiographs of the right hip FINDINGS: Satisfactory reduction of right hip dislocation without post procedure fracture nor hardware failure noted. IMPRESSION: Satisfactory reduction of right hip arthroplasty. Electronically Signed   By: Ashley Royalty M.D.   On: 02/01/2016 22:07   Dg Hip Unilat W Or Wo Pelvis 2-3 Views Right  Result Date: 02/16/2016 CLINICAL DATA:  Right hip pain, hip dislocation EXAM: DG HIP (WITH OR WITHOUT PELVIS) 2-3V RIGHT COMPARISON:  Study performed at 2140 hours on the same day FINDINGS: The patient is status post right hip arthroplasty. There is dislocation of the femoral head relative to the acetabular component. The femoral head is situated cephalad, lateral and anterior to the acetabular cup. No fracture is identified. IMPRESSION: Dislocated right hip arthroplasty with femoral head component situated cephalad, lateral and anterior to the acetabular component. Electronically Signed   By: Ashley Royalty M.D.   On: 02/16/2016 23:27   Dg Hip Unilat With Pelvis 2-3 Views Right  Result Date: 02/01/2016 CLINICAL DATA:  Right hip and knee pain, fall EXAM: DG HIP (WITH OR WITHOUT PELVIS) 2-3V RIGHT COMPARISON:  01/19/2016 FINDINGS: Left femoral head projects in joint. Pubic symphysis appears intact. The SI joints are symmetric. Multiple calcified phleboliths in the pelvis. The patient is status post right hip replacement. There is anterior, superior dislocation of the right femoral portion of the prosthesis with respect to the acetabular cup. There is no obvious fracture. IMPRESSION:  Abnormal alignment of right hip replacement. The femoral component is dislocated superiorly, anteriorly and slightly medial to the acetabular portion of the replacement. Electronically Signed   By: Donavan Foil M.D.   On: 02/01/2016 20:08    Disposition: 01-Home or Oklee D, MD. Schedule an appointment as soon as possible for a visit in 2 days.   Specialty:  Orthopedic Surgery Why:  As needed Contact information: [address] [place] [address] [phone]        Mauri Pole, MD. Schedule an appointment as soon as possible for a visit in 1 week.   Specialty:  Orthopedic Surgery Contact information: [address] [place] [address] [phone]            Signed: Elie Goody 02/22/2016, 3:42 PM

## 2016-02-27 ENCOUNTER — Observation Stay (HOSPITAL_COMMUNITY)
Admission: EM | Admit: 2016-02-27 | Discharge: 2016-03-03 | DRG: 467 | Disposition: A | Discharge status: Home Health | Payer: BLUE CROSS/BLUE SHIELD | Attending: Orthopedic Surgery | Admitting: Orthopedic Surgery

## 2016-02-27 ENCOUNTER — Encounter (HOSPITAL_COMMUNITY): Payer: Self-pay | Admitting: *Deleted

## 2016-02-27 ENCOUNTER — Emergency Department (HOSPITAL_COMMUNITY): Payer: BLUE CROSS/BLUE SHIELD

## 2016-02-27 DIAGNOSIS — R9439 Abnormal result of other cardiovascular function study: Secondary | ICD-10-CM | POA: Diagnosis not present

## 2016-02-27 DIAGNOSIS — M549 Dorsalgia, unspecified: Secondary | ICD-10-CM | POA: Diagnosis not present

## 2016-02-27 DIAGNOSIS — J449 Chronic obstructive pulmonary disease, unspecified: Secondary | ICD-10-CM | POA: Diagnosis not present

## 2016-02-27 DIAGNOSIS — E039 Hypothyroidism, unspecified: Secondary | ICD-10-CM | POA: Diagnosis not present

## 2016-02-27 DIAGNOSIS — D649 Anemia, unspecified: Secondary | ICD-10-CM | POA: Diagnosis not present

## 2016-02-27 DIAGNOSIS — Y92090 Kitchen in other non-institutional residence as the place of occurrence of the external cause: Secondary | ICD-10-CM | POA: Diagnosis not present

## 2016-02-27 DIAGNOSIS — Z886 Allergy status to analgesic agent status: Secondary | ICD-10-CM | POA: Insufficient documentation

## 2016-02-27 DIAGNOSIS — E785 Hyperlipidemia, unspecified: Secondary | ICD-10-CM | POA: Insufficient documentation

## 2016-02-27 DIAGNOSIS — M1990 Unspecified osteoarthritis, unspecified site: Secondary | ICD-10-CM | POA: Diagnosis not present

## 2016-02-27 DIAGNOSIS — T84028A Dislocation of other internal joint prosthesis, initial encounter: Secondary | ICD-10-CM

## 2016-02-27 DIAGNOSIS — M419 Scoliosis, unspecified: Secondary | ICD-10-CM | POA: Insufficient documentation

## 2016-02-27 DIAGNOSIS — T84029A Dislocation of unspecified internal joint prosthesis, initial encounter: Secondary | ICD-10-CM

## 2016-02-27 DIAGNOSIS — M25559 Pain in unspecified hip: Secondary | ICD-10-CM | POA: Diagnosis not present

## 2016-02-27 DIAGNOSIS — I208 Other forms of angina pectoris: Secondary | ICD-10-CM | POA: Insufficient documentation

## 2016-02-27 DIAGNOSIS — Z9981 Dependence on supplemental oxygen: Secondary | ICD-10-CM | POA: Insufficient documentation

## 2016-02-27 DIAGNOSIS — I10 Essential (primary) hypertension: Secondary | ICD-10-CM | POA: Diagnosis not present

## 2016-02-27 DIAGNOSIS — Z419 Encounter for procedure for purposes other than remedying health state, unspecified: Secondary | ICD-10-CM

## 2016-02-27 DIAGNOSIS — Y9389 Activity, other specified: Secondary | ICD-10-CM | POA: Insufficient documentation

## 2016-02-27 DIAGNOSIS — K219 Gastro-esophageal reflux disease without esophagitis: Secondary | ICD-10-CM | POA: Insufficient documentation

## 2016-02-27 DIAGNOSIS — Z79899 Other long term (current) drug therapy: Secondary | ICD-10-CM | POA: Insufficient documentation

## 2016-02-27 DIAGNOSIS — X58XXXA Exposure to other specified factors, initial encounter: Secondary | ICD-10-CM | POA: Insufficient documentation

## 2016-02-27 DIAGNOSIS — M25551 Pain in right hip: Secondary | ICD-10-CM | POA: Diagnosis present

## 2016-02-27 DIAGNOSIS — T84020A Dislocation of internal right hip prosthesis, initial encounter: Principal | ICD-10-CM | POA: Insufficient documentation

## 2016-02-27 DIAGNOSIS — S73004A Unspecified dislocation of right hip, initial encounter: Secondary | ICD-10-CM | POA: Diagnosis present

## 2016-02-27 DIAGNOSIS — F419 Anxiety disorder, unspecified: Secondary | ICD-10-CM | POA: Diagnosis not present

## 2016-02-27 DIAGNOSIS — M719 Bursopathy, unspecified: Secondary | ICD-10-CM | POA: Insufficient documentation

## 2016-02-27 DIAGNOSIS — Z96641 Presence of right artificial hip joint: Secondary | ICD-10-CM | POA: Diagnosis not present

## 2016-02-27 DIAGNOSIS — M6530 Trigger finger, unspecified finger: Secondary | ICD-10-CM | POA: Insufficient documentation

## 2016-02-27 DIAGNOSIS — Z885 Allergy status to narcotic agent status: Secondary | ICD-10-CM | POA: Insufficient documentation

## 2016-02-27 DIAGNOSIS — Z8249 Family history of ischemic heart disease and other diseases of the circulatory system: Secondary | ICD-10-CM | POA: Insufficient documentation

## 2016-02-27 DIAGNOSIS — G8929 Other chronic pain: Secondary | ICD-10-CM | POA: Diagnosis not present

## 2016-02-27 DIAGNOSIS — Z825 Family history of asthma and other chronic lower respiratory diseases: Secondary | ICD-10-CM | POA: Insufficient documentation

## 2016-02-27 DIAGNOSIS — G43909 Migraine, unspecified, not intractable, without status migrainosus: Secondary | ICD-10-CM | POA: Diagnosis not present

## 2016-02-27 DIAGNOSIS — Z9049 Acquired absence of other specified parts of digestive tract: Secondary | ICD-10-CM | POA: Insufficient documentation

## 2016-02-27 DIAGNOSIS — J309 Allergic rhinitis, unspecified: Secondary | ICD-10-CM | POA: Insufficient documentation

## 2016-02-27 DIAGNOSIS — Z96649 Presence of unspecified artificial hip joint: Secondary | ICD-10-CM

## 2016-02-27 DIAGNOSIS — Z882 Allergy status to sulfonamides status: Secondary | ICD-10-CM | POA: Insufficient documentation

## 2016-02-27 DIAGNOSIS — F17210 Nicotine dependence, cigarettes, uncomplicated: Secondary | ICD-10-CM | POA: Insufficient documentation

## 2016-02-27 DIAGNOSIS — R52 Pain, unspecified: Secondary | ICD-10-CM | POA: Diagnosis not present

## 2016-02-27 DIAGNOSIS — Z880 Allergy status to penicillin: Secondary | ICD-10-CM | POA: Insufficient documentation

## 2016-02-27 MED ORDER — ONDANSETRON HCL 4 MG/2ML IJ SOLN
4.0000 mg | Freq: Once | INTRAMUSCULAR | Status: AC
  Administered 2016-02-27: 4 mg via INTRAVENOUS
  Filled 2016-02-27: qty 2

## 2016-02-27 MED ORDER — FENTANYL CITRATE (PF) 100 MCG/2ML IJ SOLN
50.0000 ug | INTRAMUSCULAR | Status: AC | PRN
  Administered 2016-02-27 – 2016-02-28 (×2): 50 ug via INTRAVENOUS
  Filled 2016-02-27 (×2): qty 2

## 2016-02-27 NOTE — ED Notes (Signed)
Bed: WA20 Expected date:  Expected time:  Means of arrival:  Comments: EMS spontaneous hip dislocation/Morphine 8 mg 159/66

## 2016-02-27 NOTE — ED Notes (Signed)
Pt at xray

## 2016-02-27 NOTE — ED Triage Notes (Signed)
Patient arrives by [facility] with complaints of right hip dislocation. Per EMS, patient was standing at the mirror combing her hair and it just dislocated, this is the 5th dislocation since the hip was done first of December. Patient has received 10 mg Morphine with no relief.

## 2016-02-28 ENCOUNTER — Observation Stay (HOSPITAL_COMMUNITY): Payer: BLUE CROSS/BLUE SHIELD | Admitting: Registered Nurse

## 2016-02-28 ENCOUNTER — Observation Stay (HOSPITAL_COMMUNITY): Payer: BLUE CROSS/BLUE SHIELD

## 2016-02-28 ENCOUNTER — Encounter (HOSPITAL_COMMUNITY): Payer: Self-pay

## 2016-02-28 ENCOUNTER — Encounter (HOSPITAL_COMMUNITY)
Admission: EM | Disposition: A | Discharge status: Home Health | Payer: Self-pay | Source: Home / Self Care | Attending: Emergency Medicine

## 2016-02-28 DIAGNOSIS — S73004A Unspecified dislocation of right hip, initial encounter: Secondary | ICD-10-CM | POA: Diagnosis present

## 2016-02-28 DIAGNOSIS — T84020A Dislocation of internal right hip prosthesis, initial encounter: Secondary | ICD-10-CM | POA: Diagnosis not present

## 2016-02-28 DIAGNOSIS — R9439 Abnormal result of other cardiovascular function study: Secondary | ICD-10-CM | POA: Diagnosis not present

## 2016-02-28 DIAGNOSIS — M719 Bursopathy, unspecified: Secondary | ICD-10-CM | POA: Diagnosis not present

## 2016-02-28 DIAGNOSIS — I208 Other forms of angina pectoris: Secondary | ICD-10-CM | POA: Diagnosis not present

## 2016-02-28 DIAGNOSIS — Z9981 Dependence on supplemental oxygen: Secondary | ICD-10-CM | POA: Diagnosis not present

## 2016-02-28 DIAGNOSIS — M419 Scoliosis, unspecified: Secondary | ICD-10-CM | POA: Diagnosis not present

## 2016-02-28 DIAGNOSIS — S73034A Other anterior dislocation of right hip, initial encounter: Secondary | ICD-10-CM | POA: Diagnosis not present

## 2016-02-28 HISTORY — PX: HIP CLOSED REDUCTION: SHX983

## 2016-02-28 LAB — CBC
HEMATOCRIT: 30.4 % — AB (ref 36.0–46.0)
HEMOGLOBIN: 9.3 g/dL — AB (ref 12.0–15.0)
MCH: 26.1 pg (ref 26.0–34.0)
MCHC: 30.6 g/dL (ref 30.0–36.0)
MCV: 85.2 fL (ref 78.0–100.0)
PLATELETS: 256 10*3/uL (ref 150–400)
RBC: 3.57 MIL/uL — AB (ref 3.87–5.11)
RDW: 18.1 % — ABNORMAL HIGH (ref 11.5–15.5)
WBC: 8.8 10*3/uL (ref 4.0–10.5)

## 2016-02-28 LAB — SURGICAL PCR SCREEN
MRSA, PCR: NEGATIVE
Staphylococcus aureus: NEGATIVE

## 2016-02-28 LAB — CREATININE, SERUM
Creatinine, Ser: 0.9 mg/dL (ref 0.44–1.00)
GFR calc non Af Amer: >60 mL/min (ref >60)

## 2016-02-28 SURGERY — CLOSED REDUCTION, HIP
Anesthesia: General | Site: Hip | Laterality: Right

## 2016-02-28 MED ORDER — PROMETHAZINE HCL 25 MG/ML IJ SOLN: 6.2500 mg | INTRAMUSCULAR | Status: DC | PRN

## 2016-02-28 MED ORDER — ACETAMINOPHEN 325 MG PO TABS: 650.0000 mg | ORAL_TABLET | Freq: Four times a day (QID) | ORAL | Status: DC | PRN

## 2016-02-28 MED ORDER — ACETAMINOPHEN 650 MG RE SUPP: 650.0000 mg | Freq: Four times a day (QID) | RECTAL | Status: DC | PRN

## 2016-02-28 MED ORDER — MIDAZOLAM HCL 5 MG/5ML IJ SOLN
INTRAMUSCULAR | Status: DC | PRN
  Administered 2016-02-28: 2 mg via INTRAVENOUS

## 2016-02-28 MED ORDER — LORATADINE 10 MG PO TABS
10.0000 mg | ORAL_TABLET | Freq: Every day | ORAL | Status: DC
  Administered 2016-02-29 – 2016-03-03 (×4): 10 mg via ORAL
  Filled 2016-02-28 (×4): qty 1

## 2016-02-28 MED ORDER — POTASSIUM CHLORIDE CRYS ER 10 MEQ PO TBCR
10.0000 meq | EXTENDED_RELEASE_TABLET | Freq: Every day | ORAL | Status: DC
  Administered 2016-02-28 – 2016-03-02 (×4): 10 meq via ORAL
  Filled 2016-02-28 (×6): qty 1

## 2016-02-28 MED ORDER — METOCLOPRAMIDE HCL 5 MG PO TABS: 5.0000 mg | ORAL_TABLET | Freq: Three times a day (TID) | ORAL | Status: DC | PRN

## 2016-02-28 MED ORDER — ACETAMINOPHEN 500 MG PO TABS: 1000.0000 mg | ORAL_TABLET | Freq: Four times a day (QID) | ORAL | Status: DC

## 2016-02-28 MED ORDER — ENOXAPARIN SODIUM 40 MG/0.4ML ~~LOC~~ SOLN
40.0000 mg | SUBCUTANEOUS | Status: AC
  Administered 2016-02-29 – 2016-03-01 (×2): 40 mg via SUBCUTANEOUS
  Filled 2016-02-28 (×2): qty 0.4

## 2016-02-28 MED ORDER — FENTANYL CITRATE (PF) 100 MCG/2ML IJ SOLN
INTRAMUSCULAR | Status: DC | PRN
  Administered 2016-02-28 (×2): 50 ug via INTRAVENOUS

## 2016-02-28 MED ORDER — LIDOCAINE HCL (CARDIAC) 20 MG/ML IV SOLN
INTRAVENOUS | Status: DC | PRN
  Administered 2016-02-28: 100 mg via INTRAVENOUS

## 2016-02-28 MED ORDER — HYDROMORPHONE HCL 1 MG/ML IJ SOLN
0.2500 mg | INTRAMUSCULAR | Status: DC | PRN
  Administered 2016-02-28 (×2): 0.5 mg via INTRAVENOUS

## 2016-02-28 MED ORDER — PROPOFOL 10 MG/ML IV BOLUS
INTRAVENOUS | Status: AC
Start: 2016-02-28 — End: 2016-02-28
  Filled 2016-02-28: qty 20

## 2016-02-28 MED ORDER — TRIAMTERENE-HCTZ 37.5-25 MG PO CAPS
1.0000 | ORAL_CAPSULE | Freq: Every day | ORAL | Status: DC
  Administered 2016-02-29 – 2016-03-01 (×2): 1 via ORAL
  Filled 2016-02-28 (×3): qty 1

## 2016-02-28 MED ORDER — ONDANSETRON HCL 4 MG/2ML IJ SOLN
INTRAMUSCULAR | Status: AC
  Filled 2016-02-28: qty 2

## 2016-02-28 MED ORDER — FERROUS SULFATE 325 (65 FE) MG PO TABS
325.0000 mg | ORAL_TABLET | Freq: Three times a day (TID) | ORAL | Status: DC
  Administered 2016-02-28 – 2016-03-02 (×8): 325 mg via ORAL
  Filled 2016-02-28 (×8): qty 1

## 2016-02-28 MED ORDER — DOCUSATE SODIUM 100 MG PO CAPS
100.0000 mg | ORAL_CAPSULE | Freq: Two times a day (BID) | ORAL | Status: DC
  Administered 2016-02-29 – 2016-03-03 (×6): 100 mg via ORAL
  Filled 2016-02-28 (×7): qty 1

## 2016-02-28 MED ORDER — SUCCINYLCHOLINE CHLORIDE 200 MG/10ML IV SOSY
PREFILLED_SYRINGE | INTRAVENOUS | Status: DC | PRN
  Administered 2016-02-28: 120 mg via INTRAVENOUS

## 2016-02-28 MED ORDER — POLYETHYLENE GLYCOL 3350 17 G PO PACK
17.0000 g | PACK | Freq: Two times a day (BID) | ORAL | Status: DC
  Administered 2016-02-29 – 2016-03-03 (×3): 17 g via ORAL
  Filled 2016-02-28 (×6): qty 1

## 2016-02-28 MED ORDER — FENTANYL CITRATE (PF) 100 MCG/2ML IJ SOLN
100.0000 ug | Freq: Once | INTRAMUSCULAR | Status: AC
  Administered 2016-02-28: 100 ug via INTRAVENOUS
  Filled 2016-02-28: qty 2

## 2016-02-28 MED ORDER — ONDANSETRON HCL 4 MG PO TABS: 4.0000 mg | ORAL_TABLET | Freq: Four times a day (QID) | ORAL | Status: DC | PRN

## 2016-02-28 MED ORDER — METHOCARBAMOL 500 MG PO TABS
500.0000 mg | ORAL_TABLET | Freq: Four times a day (QID) | ORAL | Status: DC | PRN
Start: 2016-02-28 — End: 2016-03-03

## 2016-02-28 MED ORDER — SUCCINYLCHOLINE CHLORIDE 200 MG/10ML IV SOSY
PREFILLED_SYRINGE | INTRAVENOUS | Status: AC
  Filled 2016-02-28: qty 10

## 2016-02-28 MED ORDER — ONDANSETRON HCL 4 MG/2ML IJ SOLN
INTRAMUSCULAR | Status: DC | PRN
  Administered 2016-02-28: 4 mg via INTRAVENOUS

## 2016-02-28 MED ORDER — MORPHINE SULFATE (PF) 2 MG/ML IV SOLN
2.0000 mg | INTRAVENOUS | Status: DC | PRN
  Administered 2016-02-29 – 2016-03-02 (×5): 2 mg via INTRAVENOUS
  Filled 2016-02-28 (×5): qty 1

## 2016-02-28 MED ORDER — PANTOPRAZOLE SODIUM 40 MG PO TBEC
40.0000 mg | DELAYED_RELEASE_TABLET | Freq: Every day | ORAL | Status: DC
  Administered 2016-02-29 – 2016-03-03 (×4): 40 mg via ORAL
  Filled 2016-02-28 (×4): qty 1

## 2016-02-28 MED ORDER — OXYCODONE HCL 5 MG PO TABS
5.0000 mg | ORAL_TABLET | ORAL | Status: DC | PRN
  Administered 2016-02-28 – 2016-02-29 (×4): 10 mg via ORAL
  Administered 2016-02-29 – 2016-03-01 (×2): 5 mg via ORAL
  Administered 2016-03-01 – 2016-03-03 (×4): 10 mg via ORAL
  Filled 2016-02-28 (×2): qty 2
  Filled 2016-02-28 (×2): qty 1
  Filled 2016-02-28 (×7): qty 2
  Filled 2016-02-28: qty 1

## 2016-02-28 MED ORDER — MIDAZOLAM HCL 2 MG/2ML IJ SOLN
INTRAMUSCULAR | Status: AC
  Filled 2016-02-28: qty 2

## 2016-02-28 MED ORDER — LIDOCAINE 2% (20 MG/ML) 5 ML SYRINGE
INTRAMUSCULAR | Status: AC
  Filled 2016-02-28: qty 5

## 2016-02-28 MED ORDER — ALPRAZOLAM 1 MG PO TABS
1.0000 mg | ORAL_TABLET | Freq: Four times a day (QID) | ORAL | Status: DC
  Administered 2016-02-28 – 2016-03-03 (×14): 1 mg via ORAL
  Filled 2016-02-28: qty 2
  Filled 2016-02-28 (×8): qty 1
  Filled 2016-02-28: qty 2
  Filled 2016-02-28 (×4): qty 1

## 2016-02-28 MED ORDER — METOCLOPRAMIDE HCL 5 MG/ML IJ SOLN
5.0000 mg | Freq: Three times a day (TID) | INTRAMUSCULAR | Status: DC | PRN
  Administered 2016-02-29: 10 mg via INTRAVENOUS
  Filled 2016-02-28: qty 2

## 2016-02-28 MED ORDER — ATORVASTATIN CALCIUM 20 MG PO TABS
20.0000 mg | ORAL_TABLET | Freq: Every day | ORAL | Status: DC
  Administered 2016-02-28 – 2016-03-02 (×4): 20 mg via ORAL
  Filled 2016-02-28 (×4): qty 1

## 2016-02-28 MED ORDER — DEXAMETHASONE SODIUM PHOSPHATE 4 MG/ML IJ SOLN
INTRAMUSCULAR | Status: DC | PRN
  Administered 2016-02-28: 10 mg via INTRAVENOUS

## 2016-02-28 MED ORDER — LACTATED RINGERS IV SOLN
INTRAVENOUS | Status: DC
  Administered 2016-02-28: 13:00:00 via INTRAVENOUS

## 2016-02-28 MED ORDER — SERTRALINE HCL 50 MG PO TABS
150.0000 mg | ORAL_TABLET | Freq: Every day | ORAL | Status: DC
  Administered 2016-02-28 – 2016-03-02 (×4): 150 mg via ORAL
  Filled 2016-02-28 (×4): qty 1

## 2016-02-28 MED ORDER — PROPOFOL 10 MG/ML IV BOLUS
200.0000 mg | Freq: Once | INTRAVENOUS | Status: AC
  Administered 2016-02-28: 150 mg via INTRAVENOUS
  Filled 2016-02-28: qty 20

## 2016-02-28 MED ORDER — FENTANYL CITRATE (PF) 100 MCG/2ML IJ SOLN
INTRAMUSCULAR | Status: AC
  Filled 2016-02-28: qty 2

## 2016-02-28 MED ORDER — ONDANSETRON HCL 4 MG/2ML IJ SOLN
4.0000 mg | Freq: Four times a day (QID) | INTRAMUSCULAR | Status: DC | PRN
  Administered 2016-02-29: 4 mg via INTRAVENOUS
  Filled 2016-02-28: qty 2

## 2016-02-28 MED ORDER — DEXAMETHASONE SODIUM PHOSPHATE 10 MG/ML IJ SOLN
INTRAMUSCULAR | Status: AC
  Filled 2016-02-28: qty 1

## 2016-02-28 MED ORDER — HYDROMORPHONE HCL 1 MG/ML IJ SOLN
INTRAMUSCULAR | Status: AC
  Filled 2016-02-28: qty 1

## 2016-02-28 MED ORDER — LEVOTHYROXINE SODIUM 75 MCG PO TABS
75.0000 ug | ORAL_TABLET | Freq: Every day | ORAL | Status: DC
  Administered 2016-02-29 – 2016-03-03 (×4): 75 ug via ORAL
  Filled 2016-02-28 (×4): qty 1

## 2016-02-28 MED ORDER — POVIDONE-IODINE 10 % EX SWAB: 2.0000 "application " | Freq: Once | CUTANEOUS | Status: DC

## 2016-02-28 MED ORDER — FAMOTIDINE 20 MG PO TABS
10.0000 mg | ORAL_TABLET | Freq: Every day | ORAL | Status: DC
  Administered 2016-03-01 – 2016-03-03 (×3): 10 mg via ORAL
  Filled 2016-02-28 (×4): qty 1

## 2016-02-28 MED ORDER — PROPOFOL 10 MG/ML IV BOLUS
INTRAVENOUS | Status: AC | PRN
  Administered 2016-02-28: 50 mg via INTRAVENOUS
  Administered 2016-02-28 (×2): 20 mg via INTRAVENOUS

## 2016-02-28 MED ORDER — CHLORHEXIDINE GLUCONATE 4 % EX LIQD
60.0000 mL | Freq: Once | CUTANEOUS | Status: DC
  Administered 2016-02-28: 4 via TOPICAL
  Filled 2016-02-28: qty 60

## 2016-02-28 NOTE — ED Notes (Signed)
External rotation of right hip noted.  Pt states the pain is similar to past dislocations.

## 2016-02-28 NOTE — Anesthesia Preprocedure Evaluation (Addendum)
Anesthesia Evaluation  Patient identified by MRN, date of birth, ID band Patient awake    Reviewed: Allergy & Precautions, NPO status , Patient's Chart, lab work & pertinent test results  History of Anesthesia Complications Negative for: history of anesthetic complications  Airway Mallampati: II  TM Distance: >3 FB Neck ROM: Full    Dental  (+) Dental Advisory Given, Poor Dentition   Pulmonary COPD, Current Smoker,    Pulmonary exam normal        Cardiovascular hypertension, Normal cardiovascular exam   The left ventricular systolic function is normal.  LV end diastolic pressure is normal.  There is trivial (1+) mitral regurgitation.  There is no aortic valve stenosis.  Angiographically normal coronary arteries   Angiographically no significant coronary disease with relatively minimal inferior wall perfusion with a small caliber PDA and no significant posterior lateral branching. However there is no lesion to explain the patient's inferior ischemia.   Neuro/Psych  Headaches, PSYCHIATRIC DISORDERS Anxiety Depression    GI/Hepatic Neg liver ROS, GERD  ,  Endo/Other  Hypothyroidism Morbid obesity  Renal/GU negative Renal ROS     Musculoskeletal   Abdominal   Peds  Hematology   Anesthesia Other Findings   Reproductive/Obstetrics                            Anesthesia Physical Anesthesia Plan  ASA: III  Anesthesia Plan: General   Post-op Pain Management:    Induction: Intravenous  Airway Management Planned: Mask  Additional Equipment:   Intra-op Plan:   Post-operative Plan:   Informed Consent: I have reviewed the patients History and Physical, chart, labs and discussed the procedure including the risks, benefits and alternatives for the proposed anesthesia with the patient or authorized representative who has indicated his/her understanding and acceptance.   Dental advisory  given  Plan Discussed with: CRNA, Anesthesiologist and Surgeon  Anesthesia Plan Comments:        Anesthesia Quick Evaluation

## 2016-02-28 NOTE — Op Note (Signed)
Date of Surgery: 02/28/2016  INDICATIONS: [name] is a 61 y.o.-year-old female with a right anterior prosthetic hip dislocation;  The patient did consent to the procedure after discussion of the risks and benefits.  PREOPERATIVE DIAGNOSIS: right anterior hip dislocation status post total hip arthroplasty  POSTOPERATIVE DIAGNOSIS: Same.  PROCEDURE: closed reduction of right hip under general anesthesia well.   SURGEON: Geralynn Rile, M.D.  ASSIST: none.  ANESTHESIA:  general  IV FLUIDS AND URINE: See anesthesia.  ESTIMATED BLOOD LOSS: 0 mL.  IMPLANTS:  none  DRAINS: none  COMPLICATIONS: None.  DESCRIPTION OF PROCEDURE: The patient was brought to the operating room and placed supine on the operating table.  The patient had been signed prior to the procedure and this was documented. The patient had the anesthesia placed by the anesthesiologist.  A time-out was performed to confirm that this was the correct patient, site, side and location.  Once adequate muscle relaxation was obtained to proceed with a reduction maneuver. All stabilizing the pelvis direct longitudinal traction was applied and internal rotation to the right leg. Palpable and audible reduction sound was heard and appreciated.  Portal x-ray was used to confirm the reduced right total hip.  Once confirmation was obtained patient was awakened without complication. She was transferred to PACU in stable condition.  POSTOPERATIVE PLAN: [name] Will be admitted to my service postoperatively. She can be weightbearing as tolerated on the right lower shoulder mini with activity ad lib.  I discussed her case with Dr. Alvan Dame who will be performing her revision surgery. Tentatively this was planned for middle of February we will move that up to this coming Saturday secondary to her extreme instability and need for revision surgery.  She will remain as an inpatient while we get the appropriate materials and equipment available  for her case on Saturday.

## 2016-02-28 NOTE — ED Provider Notes (Signed)
[facility] DEPT Provider Note   CSN: [id number] Arrival date & time: 02/27/16  2244  By signing my name below, I, [name], attest that this documentation has been prepared under the direction and in the presence of Margarita Mail, PA-C. Electronically Signed: Ruta Hinds., ED Scribe. 02/28/16. 12:14 AM.   History   Chief Complaint Chief Complaint  Patient presents with  . Hip Injury    HPI  [name] is a 61 y.o. female with hx of R posterior hip replacement who presents to the  Emergency [facility] complaining of R hip dislocation with sudden onset x1 hour. Pt states that she had the R hip revised 12/4 and reports this being the 5th dislocation since the revision. Pt states that while standing and brushing her hair, the hip spontaneously dislocated. She states that she was given '10mg'$  Morphine and Fentanyl with mild relief. Pt denies numbness/tinlging in feet. Of note, pt is scheduled for to have R hip evaluated on Feb. 12th   The history is provided by the patient. No language interpreter was used.    Past Medical History:  Diagnosis Date  . Allergic rhinitis   . Anemia   . Anxiety   . Chronic back pain   . COPD (chronic obstructive pulmonary disease) ([place])   . Depression   . Essential hypertension   . GERD (gastroesophageal reflux disease)   . Headache    migraines  . History of gastritis    EGD 2015  . History of home oxygen therapy    2 liters at hs last 6 months  . Hypothyroidism   . Osteoarthritis    oa  . Scoliosis     Patient Active Problem List   Diagnosis Date Noted  . Dislocation of hip prosthesis ([place]) 02/17/2016  . Anterior dislocation of right hip ([place]) 01/19/2016  . S/P revision of total hip 01/01/2016  . Abnormal nuclear stress test: Intermediate Risk 10/31/2015  . Atypical angina ([place]) 10/31/2015  . Hip bursitis 08/20/2012  . Trigger finger, acquired 08/20/2012  . TOBACCO USER 12/12/2008  . DYSLIPIDEMIA  11/17/2008  . ANXIETY DISORDER 11/16/2008  . HYPERTENSION, BENIGN 11/16/2008  . GEN OSTEOARTHROSIS INVOLVING MULTIPLE SITES 11/16/2008  . CHEST DISCOMFORT, ATYPICAL 11/16/2008    Past Surgical History:  Procedure Laterality Date  . APPENDECTOMY     1985  . CARDIAC CATHETERIZATION N/A 10/31/2015   Procedure: Left Heart Cath and Coronary Angiography;  Surgeon: Leonie Man, MD;  Location: [facility];  Service: Cardiovascular;  Laterality: N/A;  . CARPAL TUNNEL RELEASE Left   . CARPAL TUNNEL RELEASE Right   . CHOLECYSTECTOMY  late 1980's  . HIP CLOSED REDUCTION Right 01/08/2016   Procedure: CLOSED MANIPULATION HIP;  Surgeon: Susa Day, MD;  Location: [facility];  Service: Orthopedics;  Laterality: Right;  . HIP CLOSED REDUCTION Right 01/19/2016   Procedure: ATTEMPTED CLOSED REDUCTION RIGHT HIP;  Surgeon: Wylene Simmer, MD;  Location: [facility];  Service: Orthopedics;  Laterality: Right;  . HIP CLOSED REDUCTION Right 01/20/2016   Procedure: CLOSED REDUCTION RIGHT TOTAL HIP;  Surgeon: Paralee Cancel, MD;  Location: [facility];  Service: Orthopedics;  Laterality: Right;  . HIP CLOSED REDUCTION Right 02/17/2016   Procedure: CLOSED REDUCTION RIGHT TOTAL HIP;  Surgeon: Rod Can, MD;  Location: [facility];  Service: Orthopedics;  Laterality: Right;  . TONSILLECTOMY    . TOTAL ABDOMINAL HYSTERECTOMY     1985, with 1 ovary removed and 2 nd ovary removed 2003  .  TOTAL HIP ARTHROPLASTY Right    Original surgery 2006 with revision 2010  . TOTAL HIP REVISION Right 01/01/2016   Procedure: TOTAL HIP REVISION;  Surgeon: Paralee Cancel, MD;  Location: [facility];  Service: Orthopedics;  Laterality: Right;  . ULNAR NERVE TRANSPOSITION Right     OB History    No data available       Home Medications    Prior to Admission medications   Medication Sig Start Date End Date Taking? Authorizing Provider  ALPRAZolam Duanne Moron) 1 MG tablet Take 1 mg by mouth 4 (four) times daily.    Historical Provider, MD    aspirin 81 MG chewable tablet Chew 1 tablet (81 mg total) by mouth 2 (two) times daily. Take for 4 weeks. Patient not taking: Reported on 02/17/2016 01/02/16   Danae Orleans, PA-C  Aspirin-Acetaminophen-Caffeine (GOODY HEADACHE PO) Take 1 packet by mouth daily as needed.    Historical Provider, MD  atorvastatin (LIPITOR) 20 MG tablet Take 20 mg by mouth at bedtime.     Historical Provider, MD  docusate sodium (COLACE) 100 MG capsule Take 1 capsule (100 mg total) by mouth 2 (two) times daily. Patient not taking: Reported on 02/17/2016 01/02/16   Danae Orleans, PA-C  ferrous sulfate 325 (65 FE) MG tablet Take 1 tablet (325 mg total) by mouth 3 (three) times daily after meals. Patient not taking: Reported on 01/08/2016 01/02/16   Danae Orleans, PA-C  fluticasone [facility]) 50 MCG/ACT nasal spray Place 2 sprays into both nostrils daily.    Historical Provider, MD  HYDROcodone-acetaminophen (NORCO) 10-325 MG tablet Take 1-2 tablets by mouth every 4 (four) hours as needed. Patient taking differently: Take 1-2 tablets by mouth every 4 (four) hours as needed for moderate pain or severe pain.  01/02/16   Danae Orleans, PA-C  levothyroxine (SYNTHROID, LEVOTHROID) 75 MCG tablet Take 75 mcg by mouth daily before breakfast.    Historical Provider, MD  loratadine (CLARITIN) 10 MG tablet Take 10 mg by mouth daily.    Historical Provider, MD  methocarbamol (ROBAXIN) 500 MG tablet Take 1 tablet (500 mg total) by mouth every 6 (six) hours as needed for muscle spasms. 01/02/16   Danae Orleans, PA-C  nitroGLYCERIN (NITROSTAT) 0.4 MG SL tablet Place 1 tablet (0.4 mg total) under the tongue every 5 (five) minutes as needed for chest pain. Patient not taking: Reported on 01/08/2016 10/26/15 01/24/16  Lendon Colonel, NP  omeprazole (PRILOSEC) 20 MG capsule Take 20 mg by mouth daily.    Historical Provider, MD  OXYGEN Inhale into the lungs. 2 liters at hs    Historical Provider, MD  polyethylene glycol (MIRALAX /  GLYCOLAX) packet Take 17 g by mouth 2 (two) times daily. Patient not taking: Reported on 01/08/2016 01/02/16   Danae Orleans, PA-C  potassium chloride (K-DUR) 10 MEQ tablet Take 10 mEq by mouth at bedtime.     Historical Provider, MD  ranitidine (ZANTAC) 150 MG capsule Take 300 mg by mouth at bedtime.     Historical Provider, MD  sertraline (ZOLOFT) 100 MG tablet Take 150 mg by mouth at bedtime.     Historical Provider, MD  Tetrahydrozoline HCl (EYE DROPS OP) Apply 2 drops to eye daily as needed.    Historical Provider, MD  triamterene-hydrochlorothiazide (DYAZIDE) 37.5-25 MG per capsule Take 1 capsule by mouth every morning.    Historical Provider, MD    Family History Family History  Problem Relation Age of Onset  . COPD Mother   .  Lung disease Father     Asbestosis  . Heart attack Father   . Cerebral aneurysm Brother     Social History Social History  Substance Use Topics  . Smoking status: Current Every Day Smoker    Packs/day: 0.50    Years: 40.00    Types: Cigarettes  . Smokeless tobacco: Never Used  . Alcohol use No     Allergies   Aleve [naproxen sodium]; Codeine; Penicillins; and Sulfonamide derivatives   Review of Systems Review of Systems  Musculoskeletal: Positive for arthralgias (R hip).  Neurological: Negative for numbness.   A complete 10 system review of systems was obtained and all systems are negative except as noted in the HPI and PMH.    Physical Exam Updated Vital Signs BP 105/67 (BP Location: Left Arm)   Pulse 77   Ht '5\' 4"'$  (1.626 m)   Wt 211 lb (95.7 kg)   SpO2 96%   BMI 36.22 kg/m   Physical Exam  Constitutional: She appears well-developed and well-nourished. No distress.  HENT:  Head: Normocephalic and atraumatic.  Eyes: Conjunctivae are normal.  Neck: Neck supple.  Cardiovascular: Normal rate and regular rhythm.   No murmur heard. Pulmonary/Chest: Effort normal and breath sounds normal. No respiratory distress.  Abdominal: Soft.  There is no tenderness.  Musculoskeletal: She exhibits no edema.  R leg externally rotated and shortened  Neurological: She is alert.  Skin: Skin is warm and dry.  Psychiatric: She has a normal mood and affect.  Nursing note and vitals reviewed.    ED Treatments / Results   DIAGNOSTIC STUDIES: Oxygen Saturation is 96% on adequate, RA by my interpretation.   COORDINATION OF CARE: 12:14 AM-Discussed next steps with pt. Pt verbalized understanding and is agreeable with the plan.    Labs (all labs ordered are listed, but only abnormal results are displayed) Labs Reviewed - No data to display  EKG  EKG Interpretation None       Radiology Dg Hip Unilat  With Pelvis 2-3 Views Right  Result Date: 02/27/2016 CLINICAL DATA:  Acute onset of right hip pain and fall. Initial encounter. EXAM: DG HIP (WITH OR WITHOUT PELVIS) 2-3V RIGHT COMPARISON:  Right hip radiographs performed 02/16/2016 FINDINGS: There is recurrent superior dislocation of the patient's right hip arthroplasty. No fracture is seen. Mild sclerosis is noted at the left sacroiliac joints. The left hip joint is unremarkable in appearance. The visualized bowel gas pattern is unremarkable. IMPRESSION: Recurrent superior dislocation of the patient's right hip arthroplasty. No evidence of fracture. Electronically Signed   By: Garald Balding M.D.   On: 02/27/2016 23:49    Procedures Reduction of dislocation Date/Time: 02/28/2016 4:57 AM Performed by: Margarita Mail Authorized by: Margarita Mail  Consent: Verbal consent obtained. Written consent obtained. Risks and benefits: risks, benefits and alternatives were discussed Consent given by: patient Patient understanding: patient states understanding of the procedure being performed Patient identity confirmed: provided demographic data and arm band Time out: Immediately prior to procedure a "time out" was called to verify the correct patient, procedure, equipment, support  staff and site/side marked as required. Preparation: Patient was prepped and draped in the usual sterile fashion.  Sedation: Patient sedated: yes (please see note by Dr. Dina Rich) Comments: Failed attempt at R hip reduction    (including critical care time)  Medications Ordered in ED Medications  fentaNYL (SUBLIMAZE) injection 50 mcg (50 mcg Intravenous Given 02/27/16 2300)  ondansetron (ZOFRAN) injection 4 mg (4 mg Intravenous Given 02/27/16  2259)     Initial Impression / Assessment and Plan / ED Course  I have reviewed the triage vital signs and the nursing notes.  Pertinent labs & imaging results that were available during my care of the patient were reviewed by me and considered in my medical decision making (see chart for details).      Patient With recurrent right hip dislocation. Temp did reduction failed. I have spoken with Dr. Stann Mainland, who asked that we admit the patient to the floor for a while reduction today. Patient appears stable for admission.  Final Clinical Impressions(s) / ED Diagnoses   Final diagnoses:  Dislocation of right hip, initial encounter [facility])    New Prescriptions New Prescriptions   No medications on file   I personally performed the services described in this documentation, which was scribed in my presence. The recorded information has been reviewed and is accurate.      Margarita Mail, PA-C 02/28/16 [place], MD 02/28/16 [phone]

## 2016-02-28 NOTE — Anesthesia Procedure Notes (Signed)
Date/Time: 02/28/2016 1:15 PM Performed by: Duane Boston Pre-anesthesia Checklist: Patient identified, Emergency Drugs available, Suction available, Patient being monitored and Timeout performed Patient Re-evaluated:Patient Re-evaluated prior to inductionOxygen Delivery Method: Simple face mask and Circle system utilized Preoxygenation: Pre-oxygenation with 100% oxygen Intubation Type: IV induction Ventilation: Mask ventilation without difficulty and Mask ventilation throughout procedure Placement Confirmation: positive ETCO2,  CO2 detector and breath sounds checked- equal and bilateral Dental Injury: Teeth and Oropharynx as per pre-operative assessment

## 2016-02-28 NOTE — Transfer of Care (Signed)
  Last Vitals:  Vitals:   02/28/16 0519 02/28/16 1342  BP: (!) 104/52 126/68  Pulse: 77 75  Resp: 16 (!) 9  Temp: 36.6 C     Last Pain:  Vitals:   02/28/16 0757  TempSrc:   PainSc: 8          Immediate Anesthesia Transfer of Care Note  Patient: [name]  Procedure(s) Performed: Procedure(s) (LRB): CLOSED REDUCTION HIP (Right)  Patient Location: PACU  Anesthesia Type: General  Level of Consciousness: awake, alert  and oriented  Airway & Oxygen Therapy: Patient Spontanous Breathing and Patient connected to face mask oxygen  Post-op Assessment: Report given to PACU RN and Post -op Vital signs reviewed and stable  Post vital signs: Reviewed and stable  Complications: No apparent anesthesia complications

## 2016-02-28 NOTE — Anesthesia Postprocedure Evaluation (Addendum)
Anesthesia Post Note  Patient: SARINA ROBLETO  Procedure(s) Performed: Procedure(s) (LRB): CLOSED REDUCTION HIP (Right)  Patient location during evaluation: PACU Anesthesia Type: General Level of consciousness: sedated Pain management: pain level controlled Vital Signs Assessment: post-procedure vital signs reviewed and stable Respiratory status: spontaneous breathing and respiratory function stable Cardiovascular status: stable Anesthetic complications: no       Last Vitals:  Vitals:   02/28/16 1400 02/28/16 1415  BP: 130/64   Pulse: 82   Resp: 13 12  Temp:      Last Pain:  Vitals:   02/28/16 1415  TempSrc:   PainSc: 5     LLE Motor Response: Responds to commands (02/28/16 1415) LLE Sensation: Full sensation (02/28/16 1415) RLE Motor Response: Responds to commands (02/28/16 1415) RLE Sensation: Full sensation (02/28/16 1415)      Nanette Wirsing DANIEL

## 2016-02-28 NOTE — H&P (Signed)
ORTHOPAEDIC CONSULTATION  REQUESTING PHYSICIAN: Nicholes Stairs, MD  PCP:  Deloria Lair, MD  Chief Complaint: right hip dislocation.  HPI: [name] is a 61 y.o. female who complains of  Right hip pain following a dislocation yesterday evening.  She was standing at her sink and felt the hip dislocate.  She has hx of right hip instability and is scheduled for revision next month with Dr. Alvan Dame.  She was seen early this am in the ED and had a failed attempt at closed reduction of the right hip.  Currently denies any distal symptoms.  Past Medical History:  Diagnosis Date  . Allergic rhinitis   . Anemia   . Anxiety   . Chronic back pain   . COPD (chronic obstructive pulmonary disease) ([place])   . Depression   . Essential hypertension   . GERD (gastroesophageal reflux disease)   . Headache    migraines  . History of gastritis    EGD 2015  . History of home oxygen therapy    2 liters at hs last 6 months  . Hypothyroidism   . Osteoarthritis    oa  . Scoliosis    Past Surgical History:  Procedure Laterality Date  . APPENDECTOMY     1985  . CARDIAC CATHETERIZATION N/A 10/31/2015   Procedure: Left Heart Cath and Coronary Angiography;  Surgeon: Leonie Man, MD;  Location: [facility];  Service: Cardiovascular;  Laterality: N/A;  . CARPAL TUNNEL RELEASE Left   . CARPAL TUNNEL RELEASE Right   . CHOLECYSTECTOMY  late 1980's  . HIP CLOSED REDUCTION Right 01/08/2016   Procedure: CLOSED MANIPULATION HIP;  Surgeon: Susa Day, MD;  [facility];  Service: Orthopedics;  Laterality: Right;  . HIP CLOSED REDUCTION Right 01/19/2016   Procedure: ATTEMPTED CLOSED REDUCTION RIGHT HIP;  Surgeon: Wylene Simmer, MD;  Location: [facility];  Service: Orthopedics;  Laterality: Right;  . HIP CLOSED REDUCTION Right 01/20/2016   Procedure: CLOSED REDUCTION RIGHT TOTAL HIP;  Surgeon: Paralee Cancel, MD;  [facility];  Service: Orthopedics;  Laterality: Right;  . HIP CLOSED  REDUCTION Right 02/17/2016   Procedure: CLOSED REDUCTION RIGHT TOTAL HIP;  Surgeon: Rod Can, MD;  Location: [facility];  Service: Orthopedics;  Laterality: Right;  . TONSILLECTOMY    . TOTAL ABDOMINAL HYSTERECTOMY     1985, with 1 ovary removed and 2 nd ovary removed 2003  . TOTAL HIP ARTHROPLASTY Right    Original surgery 2006 with revision 2010  . TOTAL HIP REVISION Right 01/01/2016   Procedure: TOTAL HIP REVISION;  Surgeon: Paralee Cancel, MD;  [facility];  Service: Orthopedics;  Laterality: Right;  . ULNAR NERVE TRANSPOSITION Right    Social History   Social History  . Marital status: Married    Spouse name: N/A  . Number of children: N/A  . Years of education: N/A   Social History Main Topics  . Smoking status: Current Every Day Smoker    Packs/day: 0.50    Years: 40.00    Types: Cigarettes  . Smokeless tobacco: Never Used  . Alcohol use No  . Drug use: No  . Sexual activity: Not Asked   Other Topics Concern  . None   Social History Narrative  . None   Family History  Problem Relation Age of Onset  . COPD Mother   . Lung disease Father     Asbestosis  . Heart attack Father   . Cerebral aneurysm Brother  Allergies  Allergen Reactions  . Aleve [Naproxen Sodium] Other (See Comments)    Headache   . Codeine     REACTION: GI upset  . Penicillins Other (See Comments)    GI upset Has patient had a PCN reaction causing immediate rash, facial/tongue/throat swelling, SOB or lightheadedness with hypotension: No Has patient had a PCN reaction causing severe rash involving mucus membranes or skin necrosis: No Has patient had a PCN reaction that required hospitalization No Has patient had a PCN reaction occurring within the last 10 years: No If all of the above answers are "NO", then may proceed with Cephalosporin use.   . Sulfonamide Derivatives Hives   Prior to Admission medications   Medication Sig Start Date End Date Taking? Authorizing Provider    ALPRAZolam Duanne Moron) 1 MG tablet Take 1 mg by mouth 4 (four) times daily.   Yes Historical Provider, MD  Aspirin-Acetaminophen-Caffeine (GOODY HEADACHE PO) Take 1 packet by mouth daily as needed (pain).    Yes Historical Provider, MD  atorvastatin (LIPITOR) 20 MG tablet Take 20 mg by mouth at bedtime.    Yes Historical Provider, MD  fluticasone (FLONASE) 50 MCG/ACT nasal spray Place 2 sprays into both nostrils daily.   Yes Historical Provider, MD  HYDROcodone-acetaminophen (NORCO) 10-325 MG tablet Take 1-2 tablets by mouth every 4 (four) hours as needed. Patient taking differently: Take 1-2 tablets by mouth every 4 (four) hours as needed for moderate pain or severe pain.  01/02/16  Yes Danae Orleans, PA-C  levothyroxine (SYNTHROID, LEVOTHROID) 75 MCG tablet Take 75 mcg by mouth daily before breakfast.   Yes Historical Provider, MD  loratadine (CLARITIN) 10 MG tablet Take 10 mg by mouth daily.   Yes Historical Provider, MD  methocarbamol (ROBAXIN) 500 MG tablet Take 1 tablet (500 mg total) by mouth every 6 (six) hours as needed for muscle spasms. 01/02/16  Yes Danae Orleans, PA-C  omeprazole (PRILOSEC) 20 MG capsule Take 20 mg by mouth daily.   Yes Historical Provider, MD  potassium chloride (K-DUR) 10 MEQ tablet Take 10 mEq by mouth at bedtime.    Yes Historical Provider, MD  ranitidine (ZANTAC) 150 MG capsule Take 300 mg by mouth at bedtime.    Yes Historical Provider, MD  sertraline (ZOLOFT) 100 MG tablet Take 150 mg by mouth at bedtime.    Yes Historical Provider, MD  Tetrahydrozoline HCl (EYE DROPS OP) Apply 2 drops to eye daily as needed (dry eyes).    Yes Historical Provider, MD  triamterene-hydrochlorothiazide (DYAZIDE) 37.5-25 MG per capsule Take 1 capsule by mouth every morning.   Yes Historical Provider, MD  aspirin 81 MG chewable tablet Chew 1 tablet (81 mg total) by mouth 2 (two) times daily. Take for 4 weeks. Patient not taking: Reported on 02/17/2016 01/02/16   Danae Orleans, PA-C   docusate sodium (COLACE) 100 MG capsule Take 1 capsule (100 mg total) by mouth 2 (two) times daily. Patient not taking: Reported on 02/17/2016 01/02/16   Danae Orleans, PA-C  ferrous sulfate 325 (65 FE) MG tablet Take 1 tablet (325 mg total) by mouth 3 (three) times daily after meals. Patient not taking: Reported on 01/08/2016 01/02/16   Danae Orleans, PA-C  nitroGLYCERIN (NITROSTAT) 0.4 MG SL tablet Place 1 tablet (0.4 mg total) under the tongue every 5 (five) minutes as needed for chest pain. Patient not taking: Reported on 01/08/2016 10/26/15 01/24/16  Lendon Colonel, NP  OXYGEN Inhale into the lungs. 2 liters at hs  Historical Provider, MD  polyethylene glycol (MIRALAX / GLYCOLAX) packet Take 17 g by mouth 2 (two) times daily. Patient not taking: Reported on 01/08/2016 01/02/16   Danae Orleans, PA-C   Dg Hip Unilat  With Pelvis 2-3 Views Right  Result Date: 02/27/2016 CLINICAL DATA:  Acute onset of right hip pain and fall. Initial encounter. EXAM: DG HIP (WITH OR WITHOUT PELVIS) 2-3V RIGHT COMPARISON:  Right hip radiographs performed 02/16/2016 FINDINGS: There is recurrent superior dislocation of the patient's right hip arthroplasty. No fracture is seen. Mild sclerosis is noted at the left sacroiliac joints. The left hip joint is unremarkable in appearance. The visualized bowel gas pattern is unremarkable. IMPRESSION: Recurrent superior dislocation of the patient's right hip arthroplasty. No evidence of fracture. Electronically Signed   By: Garald Balding M.D.   On: 02/27/2016 23:49    Positive ROS: All other systems have been reviewed and were otherwise negative with the exception of those mentioned in the HPI and as above.  Physical Exam: General: Alert, no acute distress Cardiovascular: No pedal edema Respiratory: No cyanosis, no use of accessory musculature GI: No organomegaly, abdomen is soft and non-tender Skin: No lesions in the area of chief complaint Neurologic: Sensation  intact distally Psychiatric: Patient is competent for consent with normal mood and affect Lymphatic: No axillary or cervical lymphadenopathy  MUSCULOSKELETAL:  RLE- held shortened and ER .  +NVI distally  Assessment: Right hip prosthetic dislocation  Plan: -plan for CROR later today -NPO -will dc home following Closed reduction -she is scheduled for revision surgery on 2/19 with Dr. Alvan Dame.  And that will suffice as her follow up    Nicholes Stairs, MD Cell [phone]    02/28/2016 8:40 AM

## 2016-02-28 NOTE — ED Notes (Signed)
Unsuccessful attempt by ED providers to reduce pt's right hip.  Pt remains stable.  Pt a/o x 4 and vitals are similar to pre-sedation.

## 2016-02-29 ENCOUNTER — Encounter (HOSPITAL_COMMUNITY): Payer: Self-pay | Admitting: Orthopedic Surgery

## 2016-02-29 DIAGNOSIS — T84020A Dislocation of internal right hip prosthesis, initial encounter: Secondary | ICD-10-CM | POA: Diagnosis not present

## 2016-02-29 MED ORDER — CALCIUM CARBONATE ANTACID 500 MG PO CHEW
1.0000 | CHEWABLE_TABLET | ORAL | Status: DC | PRN
Start: 2016-02-29 — End: 2016-03-03
  Administered 2016-02-29 – 2016-03-01 (×2): 200 mg via ORAL
  Filled 2016-02-29 (×2): qty 1

## 2016-02-29 NOTE — Progress Notes (Signed)
I have discussed her with Dr. Alvan Dame and the plan will be for surgery on Saturday.  We will keep her in house until surgery and for postop care thereafter.  She is high risk for re-dislocation is she is to dc home and we will need the upcoming days to acquire the appropriate implants for her revision surgery.  Nicholes Stairs

## 2016-03-01 DIAGNOSIS — T84028A Dislocation of other internal joint prosthesis, initial encounter: Secondary | ICD-10-CM

## 2016-03-01 DIAGNOSIS — Z96649 Presence of unspecified artificial hip joint: Secondary | ICD-10-CM

## 2016-03-01 DIAGNOSIS — T84020A Dislocation of internal right hip prosthesis, initial encounter: Secondary | ICD-10-CM | POA: Diagnosis not present

## 2016-03-01 NOTE — Progress Notes (Signed)
   Subjective:  Patient reports pain as mild.    Objective:   VITALS:   Vitals:   02/29/16 2032 03/01/16 0505 03/01/16 1438 03/01/16 2141  BP: 112/61 (!) 106/56 118/77 130/88  Pulse: 67 65 72 73  Resp: '20 20 18 16  '$ Temp: 99.3 F (37.4 C) 98.1 F (36.7 C) 98.4 F (36.9 C) 97.8 F (36.6 C)  TempSrc: Oral Oral Oral Oral  SpO2: 96% 97% 95% 99%  Weight:      Height:        Neurologically intact   Lab Results  Component Value Date   WBC 8.8 02/28/2016   HGB 9.3 (L) 02/28/2016   HCT 30.4 (L) 02/28/2016   MCV 85.2 02/28/2016   PLT 256 02/28/2016   BMET    Component Value Date/Time   NA 141 01/19/2016 1527   NA 144 06/30/2014 1448   K 3.8 01/19/2016 1527   K 3.6 06/30/2014 1448   CL 110 01/19/2016 1527   CO2 23 01/19/2016 1527   CO2 25 06/30/2014 1448   GLUCOSE 91 01/19/2016 1527   GLUCOSE 86 06/30/2014 1448   BUN 19 01/19/2016 1527   BUN 19.0 06/30/2014 1448   CREATININE 0.90 02/28/2016 1549   CREATININE 1.0 06/30/2014 1448   CALCIUM 8.2 (L) 01/19/2016 1527   CALCIUM 8.2 (L) 06/30/2014 1448   GFRNONAA >60 02/28/2016 1549   GFRAA >60 02/28/2016 1549     Assessment/Plan: 2 Days Post-Op   Active Problems:   Dislocation of hip prosthesis (HCC)   Hip dislocation, right (HCC)   Instability of prosthetic hip ([place])    -To OR tomorrow with Dr. Alvan Dame -NPO tonoight at [place] -lovenox held for tomorrow   Nicholes Stairs 03/01/2016, 10:40 PM   Geralynn Rile, MD [phone]

## 2016-03-02 ENCOUNTER — Encounter (HOSPITAL_COMMUNITY)
Admission: EM | Disposition: A | Discharge status: Home Health | Payer: Self-pay | Source: Home / Self Care | Attending: Emergency Medicine

## 2016-03-02 ENCOUNTER — Encounter (HOSPITAL_COMMUNITY): Payer: Self-pay | Admitting: Certified Registered Nurse Anesthetist

## 2016-03-02 ENCOUNTER — Inpatient Hospital Stay (HOSPITAL_COMMUNITY): Payer: BLUE CROSS/BLUE SHIELD

## 2016-03-02 ENCOUNTER — Inpatient Hospital Stay: Admit: 2016-03-02 | Payer: BLUE CROSS/BLUE SHIELD | Admitting: Orthopedic Surgery

## 2016-03-02 ENCOUNTER — Inpatient Hospital Stay (HOSPITAL_COMMUNITY): Payer: BLUE CROSS/BLUE SHIELD | Admitting: Certified Registered Nurse Anesthetist

## 2016-03-02 DIAGNOSIS — T84020A Dislocation of internal right hip prosthesis, initial encounter: Secondary | ICD-10-CM | POA: Diagnosis not present

## 2016-03-02 HISTORY — PX: TOTAL HIP REVISION: SHX763

## 2016-03-02 LAB — TYPE AND SCREEN
ABO/RH(D): O NEG
ANTIBODY SCREEN: NEGATIVE

## 2016-03-02 LAB — SURGICAL PCR SCREEN
MRSA, PCR: NEGATIVE
STAPHYLOCOCCUS AUREUS: NEGATIVE

## 2016-03-02 SURGERY — TOTAL HIP REVISION
Anesthesia: Monitor Anesthesia Care | Site: Hip | Laterality: Right

## 2016-03-02 MED ORDER — OXYCODONE HCL 5 MG/5ML PO SOLN: 5.0000 mg | Freq: Once | ORAL | Status: DC | PRN

## 2016-03-02 MED ORDER — ASPIRIN 81 MG PO CHEW: 81.0000 mg | CHEWABLE_TABLET | Freq: Two times a day (BID) | ORAL | 0 refills | Status: AC

## 2016-03-02 MED ORDER — TRIAMTERENE-HCTZ 37.5-25 MG PO TABS
1.0000 | ORAL_TABLET | Freq: Every day | ORAL | Status: DC
  Administered 2016-03-02: 1 via ORAL
  Filled 2016-03-02 (×2): qty 1

## 2016-03-02 MED ORDER — MAGNESIUM CITRATE PO SOLN: 1.0000 | Freq: Once | ORAL | Status: DC | PRN

## 2016-03-02 MED ORDER — FENTANYL CITRATE (PF) 100 MCG/2ML IJ SOLN
INTRAMUSCULAR | Status: AC
  Filled 2016-03-02: qty 2

## 2016-03-02 MED ORDER — VANCOMYCIN HCL IN DEXTROSE 1-5 GM/200ML-% IV SOLN
1000.0000 mg | Freq: Two times a day (BID) | INTRAVENOUS | Status: AC
  Administered 2016-03-03: 1000 mg via INTRAVENOUS
  Filled 2016-03-02: qty 200

## 2016-03-02 MED ORDER — PROPOFOL 10 MG/ML IV BOLUS
INTRAVENOUS | Status: AC
  Filled 2016-03-02: qty 20

## 2016-03-02 MED ORDER — FERROUS SULFATE 325 (65 FE) MG PO TABS: 325.0000 mg | ORAL_TABLET | Freq: Three times a day (TID) | ORAL | 3 refills | Status: DC

## 2016-03-02 MED ORDER — DEXAMETHASONE SODIUM PHOSPHATE 10 MG/ML IJ SOLN
INTRAMUSCULAR | Status: AC
  Filled 2016-03-02: qty 1

## 2016-03-02 MED ORDER — MIDAZOLAM HCL 2 MG/2ML IJ SOLN
INTRAMUSCULAR | Status: AC
  Filled 2016-03-02: qty 2

## 2016-03-02 MED ORDER — SODIUM CHLORIDE 0.9 % IV SOLN
INTRAVENOUS | Status: DC
  Administered 2016-03-02: 09:00:00 via INTRAVENOUS

## 2016-03-02 MED ORDER — LACTATED RINGERS IV SOLN: INTRAVENOUS | Status: DC

## 2016-03-02 MED ORDER — BUPIVACAINE HCL (PF) 0.5 % IJ SOLN
INTRAMUSCULAR | Status: DC | PRN
  Administered 2016-03-02: 3 mL via INTRATHECAL

## 2016-03-02 MED ORDER — MENTHOL 3 MG MT LOZG: 1.0000 | LOZENGE | OROMUCOSAL | Status: DC | PRN

## 2016-03-02 MED ORDER — ONDANSETRON HCL 4 MG/2ML IJ SOLN
INTRAMUSCULAR | Status: DC | PRN
  Administered 2016-03-02: 4 mg via INTRAVENOUS

## 2016-03-02 MED ORDER — CHLORHEXIDINE GLUCONATE 4 % EX LIQD
60.0000 mL | Freq: Once | CUTANEOUS | Status: AC
  Administered 2016-03-02: 4 via TOPICAL
  Filled 2016-03-02: qty 60

## 2016-03-02 MED ORDER — POLYETHYLENE GLYCOL 3350 17 G PO PACK
17.0000 g | PACK | Freq: Two times a day (BID) | ORAL | 0 refills | Status: DC
Start: 2016-03-02 — End: 2016-05-03

## 2016-03-02 MED ORDER — PHENYLEPHRINE 40 MCG/ML (10ML) SYRINGE FOR IV PUSH (FOR BLOOD PRESSURE SUPPORT)
PREFILLED_SYRINGE | INTRAVENOUS | Status: AC
  Filled 2016-03-02: qty 10

## 2016-03-02 MED ORDER — BISACODYL 10 MG RE SUPP: 10.0000 mg | Freq: Every day | RECTAL | Status: DC | PRN

## 2016-03-02 MED ORDER — DOCUSATE SODIUM 100 MG PO CAPS: 100.0000 mg | ORAL_CAPSULE | Freq: Two times a day (BID) | ORAL | 0 refills | Status: DC

## 2016-03-02 MED ORDER — POVIDONE-IODINE 10 % EX SWAB: 2.0000 "application " | Freq: Once | CUTANEOUS | Status: DC

## 2016-03-02 MED ORDER — ASPIRIN 81 MG PO CHEW
81.0000 mg | CHEWABLE_TABLET | Freq: Two times a day (BID) | ORAL | Status: DC
  Administered 2016-03-02 – 2016-03-03 (×2): 81 mg via ORAL
  Filled 2016-03-02 (×2): qty 1

## 2016-03-02 MED ORDER — PROPOFOL 500 MG/50ML IV EMUL
INTRAVENOUS | Status: DC | PRN
  Administered 2016-03-02: 50 ug/kg/min via INTRAVENOUS

## 2016-03-02 MED ORDER — GENTAMICIN SULFATE 40 MG/ML IJ SOLN
5.0000 mg/kg | Freq: Once | INTRAVENOUS | Status: AC
  Administered 2016-03-02: 480 mg via INTRAVENOUS
  Filled 2016-03-02: qty 12

## 2016-03-02 MED ORDER — VANCOMYCIN HCL IN DEXTROSE 1-5 GM/200ML-% IV SOLN
1000.0000 mg | INTRAVENOUS | Status: AC
  Administered 2016-03-02: 1000 mg via INTRAVENOUS
  Filled 2016-03-02: qty 200

## 2016-03-02 MED ORDER — DIPHENHYDRAMINE HCL 25 MG PO CAPS: 25.0000 mg | ORAL_CAPSULE | Freq: Four times a day (QID) | ORAL | Status: DC | PRN

## 2016-03-02 MED ORDER — ONDANSETRON HCL 4 MG/2ML IJ SOLN
INTRAMUSCULAR | Status: AC
  Filled 2016-03-02: qty 2

## 2016-03-02 MED ORDER — PROPOFOL 10 MG/ML IV BOLUS
INTRAVENOUS | Status: AC
  Filled 2016-03-02: qty 60

## 2016-03-02 MED ORDER — SODIUM CHLORIDE 0.9 % IR SOLN
Status: DC | PRN
  Administered 2016-03-02: 2000 mL

## 2016-03-02 MED ORDER — ACETAMINOPHEN 325 MG PO TABS: 650.0000 mg | ORAL_TABLET | Freq: Four times a day (QID) | ORAL | Status: DC | PRN

## 2016-03-02 MED ORDER — LORAZEPAM 2 MG/ML IJ SOLN
3.0000 mg | Freq: Once | INTRAMUSCULAR | Status: AC
  Administered 2016-03-02: 3 mg via INTRAVENOUS
  Filled 2016-03-02: qty 2

## 2016-03-02 MED ORDER — PHENOL 1.4 % MT LIQD: 1.0000 | OROMUCOSAL | Status: DC | PRN

## 2016-03-02 MED ORDER — VANCOMYCIN HCL IN DEXTROSE 1-5 GM/200ML-% IV SOLN
INTRAVENOUS | Status: AC
  Filled 2016-03-02: qty 200

## 2016-03-02 MED ORDER — HYDROMORPHONE HCL 2 MG/ML IJ SOLN: 0.2500 mg | INTRAMUSCULAR | Status: DC | PRN

## 2016-03-02 MED ORDER — FENTANYL CITRATE (PF) 100 MCG/2ML IJ SOLN
INTRAMUSCULAR | Status: DC | PRN
  Administered 2016-03-02 (×2): 50 ug via INTRAVENOUS

## 2016-03-02 MED ORDER — OXYCODONE HCL 5 MG PO TABS: 5.0000 mg | ORAL_TABLET | ORAL | 0 refills | Status: DC | PRN

## 2016-03-02 MED ORDER — ONDANSETRON HCL 4 MG PO TABS: 4.0000 mg | ORAL_TABLET | Freq: Four times a day (QID) | ORAL | 0 refills | Status: DC | PRN

## 2016-03-02 MED ORDER — SODIUM CHLORIDE 0.9 % IV SOLN
100.0000 mL/h | INTRAVENOUS | Status: DC
  Filled 2016-03-02 (×3): qty 1000

## 2016-03-02 MED ORDER — ALUM & MAG HYDROXIDE-SIMETH 200-200-20 MG/5ML PO SUSP: 30.0000 mL | ORAL | Status: DC | PRN

## 2016-03-02 MED ORDER — OXYCODONE HCL 5 MG PO TABS: 5.0000 mg | ORAL_TABLET | Freq: Once | ORAL | Status: DC | PRN

## 2016-03-02 MED ORDER — MIDAZOLAM HCL 5 MG/5ML IJ SOLN
INTRAMUSCULAR | Status: DC | PRN
  Administered 2016-03-02: 2 mg via INTRAVENOUS

## 2016-03-02 MED ORDER — METHOCARBAMOL 500 MG PO TABS: 500.0000 mg | ORAL_TABLET | Freq: Four times a day (QID) | ORAL | 0 refills | Status: DC | PRN

## 2016-03-02 MED ORDER — TRANEXAMIC ACID 1000 MG/10ML IV SOLN
1000.0000 mg | INTRAVENOUS | Status: DC
  Filled 2016-03-02: qty 10

## 2016-03-02 MED ORDER — DEXAMETHASONE SODIUM PHOSPHATE 10 MG/ML IJ SOLN: 10.0000 mg | Freq: Once | INTRAMUSCULAR | Status: DC

## 2016-03-02 MED ORDER — PHENYLEPHRINE 40 MCG/ML (10ML) SYRINGE FOR IV PUSH (FOR BLOOD PRESSURE SUPPORT)
PREFILLED_SYRINGE | INTRAVENOUS | Status: DC | PRN
  Administered 2016-03-02 (×4): 80 ug via INTRAVENOUS

## 2016-03-02 MED ORDER — DEXAMETHASONE SODIUM PHOSPHATE 10 MG/ML IJ SOLN
INTRAMUSCULAR | Status: DC | PRN
  Administered 2016-03-02: 10 mg via INTRAVENOUS

## 2016-03-02 MED ORDER — PROPOFOL 10 MG/ML IV BOLUS
INTRAVENOUS | Status: DC | PRN
  Administered 2016-03-02: 10 mg via INTRAVENOUS
  Administered 2016-03-02 (×2): 20 mg via INTRAVENOUS
  Administered 2016-03-02: 10 mg via INTRAVENOUS
  Administered 2016-03-02: 20 mg via INTRAVENOUS
  Administered 2016-03-02: 10 mg via INTRAVENOUS

## 2016-03-02 MED ORDER — FERROUS SULFATE 325 (65 FE) MG PO TABS
325.0000 mg | ORAL_TABLET | Freq: Three times a day (TID) | ORAL | Status: DC
  Administered 2016-03-02 – 2016-03-03 (×2): 325 mg via ORAL
  Filled 2016-03-02 (×2): qty 1

## 2016-03-02 MED ORDER — LACTATED RINGERS IV SOLN
INTRAVENOUS | Status: DC | PRN
  Administered 2016-03-02 (×3): via INTRAVENOUS

## 2016-03-02 SURGICAL SUPPLY — 79 items
BAG DECANTER FOR FLEXI CONT (MISCELLANEOUS) IMPLANT
BAG ZIPLOCK 12X15 (MISCELLANEOUS) ×3 IMPLANT
BIO DEL CERAM FEM HEAD REV HIP (Head) ×2 IMPLANT
BIT DRILL RINGLOC QUICK CONN (BIT) ×1 IMPLANT
BLADE SAW SGTL 18X1.27X75 (BLADE) IMPLANT
BLADE SAW SGTL 18X1.27X75MM (BLADE)
BRUSH FEMORAL CANAL (MISCELLANEOUS) IMPLANT
CLOTH BEACON ORANGE TIMEOUT ST (SAFETY) IMPLANT
COVER PERINEAL POST (MISCELLANEOUS) IMPLANT
DERMABOND ADVANCED (GAUZE/BANDAGES/DRESSINGS) ×4
DERMABOND ADVANCED .7 DNX12 (GAUZE/BANDAGES/DRESSINGS) ×2 IMPLANT
DRAPE INCISE IOBAN 85X60 (DRAPES) ×3 IMPLANT
DRAPE ORTHO SPLIT 77X108 STRL (DRAPES) ×4
DRAPE POUCH INSTRU U-SHP 10X18 (DRAPES) ×3 IMPLANT
DRAPE STERI IOBAN 125X83 (DRAPES) ×3 IMPLANT
DRAPE SURG 17X11 SM STRL (DRAPES) ×3 IMPLANT
DRAPE SURG ORHT 6 SPLT 77X108 (DRAPES) ×2 IMPLANT
DRAPE U-SHAPE 47X51 STRL (DRAPES) ×6 IMPLANT
DRESSING AQUACEL AG SP 3.5X10 (GAUZE/BANDAGES/DRESSINGS) IMPLANT
DRILL BIT RINGLOC QUICK CONN (BIT) ×2
DRSG AQUACEL AG ADV 3.5X10 (GAUZE/BANDAGES/DRESSINGS) IMPLANT
DRSG AQUACEL AG ADV 3.5X14 (GAUZE/BANDAGES/DRESSINGS) ×3 IMPLANT
DRSG AQUACEL AG SP 3.5X10 (GAUZE/BANDAGES/DRESSINGS)
DURAPREP 26ML APPLICATOR (WOUND CARE) ×6 IMPLANT
ELECT BLADE TIP CTD 4 INCH (ELECTRODE) ×3 IMPLANT
ELECT REM PT RETURN 15FT ADLT (MISCELLANEOUS) IMPLANT
ELECT REM PT RETURN 9FT ADLT (ELECTROSURGICAL) ×6
ELECTRODE REM PT RTRN 9FT ADLT (ELECTROSURGICAL) ×2 IMPLANT
FACESHIELD WRAPAROUND (MASK) ×12 IMPLANT
GAUZE SPONGE 2X2 8PLY STRL LF (GAUZE/BANDAGES/DRESSINGS) ×1 IMPLANT
GLOVE BIOGEL M 7.0 STRL (GLOVE) IMPLANT
GLOVE BIOGEL PI IND STRL 7.5 (GLOVE) ×2 IMPLANT
GLOVE BIOGEL PI IND STRL 8.5 (GLOVE) ×2 IMPLANT
GLOVE BIOGEL PI INDICATOR 7.5 (GLOVE) ×4
GLOVE BIOGEL PI INDICATOR 8.5 (GLOVE) ×4
GLOVE ECLIPSE 8.0 STRL XLNG CF (GLOVE) ×9 IMPLANT
GLOVE ORTHO TXT STRL SZ7.5 (GLOVE) ×6 IMPLANT
GOWN STRL REUS W/TWL LRG LVL3 (GOWN DISPOSABLE) ×6 IMPLANT
GOWN STRL REUS W/TWL XL LVL3 (GOWN DISPOSABLE) ×9 IMPLANT
HANDPIECE INTERPULSE COAX TIP (DISPOSABLE)
HEAD FEM BILX DELT CER REV HIP (Head) ×1 IMPLANT
HIP ACETABULAR SCREW 6.5X20MM (Hips) ×3 IMPLANT
HOLDER FOLEY CATH W/STRAP (MISCELLANEOUS) ×3 IMPLANT
LINER ACETAB BEARING 28X44 SZF (Liner) ×2 IMPLANT
LINER ACETAB G7 44MM SZF (Liner) ×2 IMPLANT
LIQUID BAND (GAUZE/BANDAGES/DRESSINGS) ×6 IMPLANT
MANIFOLD NEPTUNE II (INSTRUMENTS) ×3 IMPLANT
MARKER SKIN DUAL TIP RULER LAB (MISCELLANEOUS) ×3 IMPLANT
NDL SAFETY ECLIPSE 18X1.5 (NEEDLE) ×1 IMPLANT
NEEDLE HYPO 18GX1.5 SHARP (NEEDLE) ×2
NS IRRIG 1000ML POUR BTL (IV SOLUTION) ×6 IMPLANT
PACK ANTERIOR HIP CUSTOM (KITS) IMPLANT
PACK TOTAL JOINT (CUSTOM PROCEDURE TRAY) ×3 IMPLANT
PADDING CAST COTTON 6X4 STRL (CAST SUPPLIES) ×3 IMPLANT
POSITIONER SURGICAL ARM (MISCELLANEOUS) ×6 IMPLANT
PRESSURIZER FEMORAL UNIV (MISCELLANEOUS) IMPLANT
SAW OSC TIP CART 19.5X105X1.3 (SAW) IMPLANT
SCREW ACE 6.5MM 15MM HIP (Screw) ×3 IMPLANT
SCREW ACETABULAR G7 6.5X30 (Screw) ×2 IMPLANT
SCREW ACETABULAR HIP 6.5X20MM (Hips) ×1 IMPLANT
SET HNDPC FAN SPRY TIP SCT (DISPOSABLE) IMPLANT
SHELL ACETAB MULTIHOLE 56MM S7 (Hips) ×2 IMPLANT
SPONGE GAUZE 2X2 STER 10/PKG (GAUZE/BANDAGES/DRESSINGS) ×2
SPONGE LAP 18X18 X RAY DECT (DISPOSABLE) ×3 IMPLANT
SPONGE LAP 4X18 X RAY DECT (DISPOSABLE) ×3 IMPLANT
STAPLER VISISTAT 35W (STAPLE) ×3 IMPLANT
SUCTION FRAZIER HANDLE 10FR (MISCELLANEOUS) ×2
SUCTION TUBE FRAZIER 10FR DISP (MISCELLANEOUS) ×1 IMPLANT
SUT MNCRL AB 4-0 PS2 18 (SUTURE) ×3 IMPLANT
SUT VIC AB 1 CT1 36 (SUTURE) ×12 IMPLANT
SUT VIC AB 2-0 CT1 27 (SUTURE) ×10
SUT VIC AB 2-0 CT1 TAPERPNT 27 (SUTURE) ×5 IMPLANT
SUT VLOC 180 0 24IN GS25 (SUTURE) ×9 IMPLANT
TOWEL OR 17X26 10 PK STRL BLUE (TOWEL DISPOSABLE) ×6 IMPLANT
TOWER CARTRIDGE SMART MIX (DISPOSABLE) IMPLANT
TRAY FOLEY W/METER SILVER 16FR (SET/KITS/TRAYS/PACK) ×6 IMPLANT
TUBE KAMVAC SUCTION (TUBING) IMPLANT
WATER STERILE IRR 1500ML POUR (IV SOLUTION) ×6 IMPLANT
YANKAUER SUCT BULB TIP 10FT TU (MISCELLANEOUS) ×3 IMPLANT

## 2016-03-02 NOTE — Anesthesia Preprocedure Evaluation (Signed)
Anesthesia Evaluation  Patient identified by MRN, date of birth, ID band Patient awake    Reviewed: Allergy & Precautions, NPO status , Patient's Chart, lab work & pertinent test results  History of Anesthesia Complications Negative for: history of anesthetic complications  Airway Mallampati: I  TM Distance: >3 FB Neck ROM: Full    Dental  (+) Missing,    Pulmonary COPD, Current Smoker,    breath sounds clear to auscultation       Cardiovascular hypertension, Pt. on medications (-) Past MI and (-) CHF (-) dysrhythmias  Rhythm:Regular     Neuro/Psych  Headaches, PSYCHIATRIC DISORDERS Anxiety Depression    GI/Hepatic Neg liver ROS, GERD  Medicated and Controlled,  Endo/Other  Hypothyroidism Morbid obesity  Renal/GU negative Renal ROS     Musculoskeletal  (+) Arthritis ,   Abdominal   Peds  Hematology  (+) anemia ,   Anesthesia Other Findings   Reproductive/Obstetrics                             Anesthesia Physical Anesthesia Plan  ASA: II  Anesthesia Plan: Spinal   Post-op Pain Management:    Induction:   Airway Management Planned: Natural Airway, Nasal Cannula and Simple Face Mask  Additional Equipment: None  Intra-op Plan:   Post-operative Plan:   Informed Consent: I have reviewed the patients History and Physical, chart, labs and discussed the procedure including the risks, benefits and alternatives for the proposed anesthesia with the patient or authorized representative who has indicated his/her understanding and acceptance.   Dental advisory given  Plan Discussed with: CRNA and Surgeon  Anesthesia Plan Comments:         Anesthesia Quick Evaluation

## 2016-03-02 NOTE — Progress Notes (Signed)
The patient is status post right hip replacement. Hardware is in good position. Postoperative air is noted.  IMPRESSION: Status post right hip replacement as above.

## 2016-03-02 NOTE — Progress Notes (Signed)
[name] is well known to me having recently undergone revision of her right hip from metal on metal articulating surface to ceramic on poly.   At time of surgery she was not noted to have significant muscle or bone damage However she has had recurrent episodes of right instability  She and I have had long discussions in the office about proceeding with surgery to revise her hip to a multi bearing joint versus constrained.  She was on the OR schedule for mid February when she dislocated again at time of admission. At this point I spoke with Dr. Stann Mainland and did not feel it was safe for her to go home again without addressing this issue.  She is ready to proceed with revision surgery today I saw her Thursday pm and reviewed our plan Consent ordered NPO lovenox held

## 2016-03-02 NOTE — Transfer of Care (Signed)
Immediate Anesthesia Transfer of Care Note  Patient: [name]  Procedure(s) Performed: Procedure(s): TOTAL HIP REVISION (Right)  Patient Location: PACU  Anesthesia Type:Spinal  Level of Consciousness:  alert, patient cooperative and responds to stimulation  Airway & Oxygen Therapy:Patient Spontanous Breathing and Patient connected to face mask oxgen  Post-op Assessment:  Report given to PACU RN and Post -op Vital signs reviewed and stable  Post vital signs:  Reviewed and stable  Last Vitals:  Vitals:   03/01/16 2141 03/02/16 0459  BP:  102/67  Pulse: 73 73  Resp: 16 18  Temp: 36.6 C 47.8 C    Complications: No apparent anesthesia complications

## 2016-03-02 NOTE — Anesthesia Procedure Notes (Signed)
Spinal  Patient location during procedure: OR Start time: 03/02/2016 1:06 PM End time: 03/02/2016 1:14 PM Staffing Anesthesiologist: Oleta Mouse Preanesthetic Checklist Completed: patient identified, surgical consent, pre-op evaluation, timeout performed, IV checked, risks and benefits discussed and monitors and equipment checked Spinal Block Patient position: sitting Prep: site prepped and draped and DuraPrep Patient monitoring: heart rate, cardiac monitor, continuous pulse ox and blood pressure Approach: midline Location: L4-5 Injection technique: single-shot Needle Needle type: Pencan  Needle gauge: 24 G Needle length: 10 cm Assessment Sensory level: T8

## 2016-03-02 NOTE — Anesthesia Postprocedure Evaluation (Addendum)
Anesthesia Post Note  Patient: [name]  Procedure(s) Performed: Procedure(s) (LRB): TOTAL HIP REVISION (Right)  Patient location during evaluation: PACU Anesthesia Type: MAC Level of consciousness: oriented and awake and alert Pain management: pain level controlled Vital Signs Assessment: post-procedure vital signs reviewed and stable Respiratory status: spontaneous breathing, respiratory function stable and patient connected to nasal cannula oxygen Cardiovascular status: blood pressure returned to baseline and stable Anesthetic complications: no       Last Vitals:  Vitals:   03/02/16 1815 03/02/16 1913  BP: 108/69 102/67  Pulse: 61 74  Resp: 15 20  Temp: 37 C 36.9 C    Last Pain:  Vitals:   03/02/16 1913  TempSrc: Oral  PainSc:                  Mattheo Swindle

## 2016-03-02 NOTE — Brief Op Note (Signed)
02/27/2016 - 03/02/2016  4:26 PM  PATIENT:  [name]  61 y.o. female  PRE-OPERATIVE DIAGNOSIS:  Failed Right Total hip arthroplasty, instability  POST-OPERATIVE DIAGNOSIS:  Failed Right Total hip arthroplasty, instability  PROCEDURE:  Procedure(s): TOTAL HIP REVISION (Right)  SURGEON:  Surgeon(s) and Role:    * Paralee Cancel, MD - Primary  PHYSICIAN ASSISTANT: Danae Orleans, PA-C  ANESTHESIA:   spinal  EBL:  Total I/O In: 2000 [I.V.:2000] Out: 700 [Urine:400; Blood:300]  BLOOD ADMINISTERED:none  DRAINS: none   LOCAL MEDICATIONS USED:  NONE  SPECIMEN:  No Specimen  DISPOSITION OF SPECIMEN:  N/A  COUNTS:  YES  TOURNIQUET:  * No tourniquets in log *  DICTATION: .Other Dictation: Dictation Number [id number]  PLAN OF CARE: Admit to inpatient   PATIENT DISPOSITION:  PACU - hemodynamically stable.   Delay start of Pharmacological VTE agent (>24hrs) due to surgical blood loss or risk of bleeding: no

## 2016-03-03 DIAGNOSIS — T84020A Dislocation of internal right hip prosthesis, initial encounter: Secondary | ICD-10-CM | POA: Diagnosis not present

## 2016-03-03 LAB — CBC
HEMATOCRIT: 27.2 % — AB (ref 36.0–46.0)
Hemoglobin: 8.3 g/dL — ABNORMAL LOW (ref 12.0–15.0)
MCH: 25.1 pg — ABNORMAL LOW (ref 26.0–34.0)
MCHC: 30.5 g/dL (ref 30.0–36.0)
MCV: 82.2 fL (ref 78.0–100.0)
PLATELETS: 256 10*3/uL (ref 150–400)
RBC: 3.31 MIL/uL — ABNORMAL LOW (ref 3.87–5.11)
RDW: 17.6 % — AB (ref 11.5–15.5)
WBC: 12.9 10*3/uL — AB (ref 4.0–10.5)

## 2016-03-03 LAB — BASIC METABOLIC PANEL
ANION GAP: 9 (ref 5–15)
BUN: 18 mg/dL (ref 6–20)
CALCIUM: 8.4 mg/dL — AB (ref 8.9–10.3)
CO2: 26 mmol/L (ref 22–32)
Chloride: 103 mmol/L (ref 101–111)
Creatinine, Ser: 0.78 mg/dL (ref 0.44–1.00)
GFR calc Af Amer: >60 mL/min (ref >60)
Glucose, Bld: 121 mg/dL — ABNORMAL HIGH (ref 65–99)
Potassium: 4.1 mmol/L (ref 3.5–5.1)
Sodium: 138 mmol/L (ref 135–145)

## 2016-03-03 NOTE — Evaluation (Signed)
Physical Therapy Evaluation Patient Details Name: JANELIS STELZER MRN: [id number] DOB: 1955/12/10 Today's Date: 03/03/2016   History of Present Illness  revision posterior THA on the right; h/o several dislocations  Clinical Impression  Pt is s/p THA resulting in the deficits listed below (see PT Problem List).Pt will benefit from skilled PT to increase their independence and safety with mobility to allow discharge to the venue listed below. Pt and spouse report feeling confident with going home. Stressing precautions (pt able to recall by end of session and handout provided) and weightbearing with ambulation.      Follow Up Recommendations Home health PT;Supervision for mobility/OOB    Equipment Recommendations  None recommended by PT    Recommendations for Other Services       Precautions / Restrictions Precautions Precautions: Posterior Hip;Fall Precaution Booklet Issued: Yes (comment) Precaution Comments: HEP/precautions provided, reviewed with pt and spouse Restrictions Weight Bearing Restrictions: Yes RLE Weight Bearing: Partial weight bearing RLE Partial Weight Bearing Percentage or Pounds: 50%      Mobility  Bed Mobility               General bed mobility comments: pt sitting in chair upon arrival  Transfers Overall transfer level: Needs assistance Equipment used: Rolling walker (2 wheeled) Transfers: Sit to/from Stand Sit to Stand: Min guard Stand pivot transfers: Min guard       General transfer comment: good hand placement, reminder for precautions  Ambulation/Gait Ambulation/Gait assistance: Min guard Ambulation Distance (Feet): 70 Feet Assistive device: Rolling walker (2 wheeled) Gait Pattern/deviations: Step-to pattern Gait velocity: decreased   General Gait Details: cues to maintain PWB through Rt LE, reviewing small steps for turns  Stairs Stairs: Yes Stairs assistance: Min assist Stair Management: No rails;Backwards;Step to pattern;With  walker Number of Stairs: 4 General stair comments: Spouse assisting. Pt and spouse report feeling confident with getting up stairs at home.   Wheelchair Mobility    Modified Rankin (Stroke Patients Only)       Balance Overall balance assessment: Needs assistance Sitting-balance support: No upper extremity supported Sitting balance-Leahy Scale: Good     Standing balance support: During functional activity Standing balance-Leahy Scale: Fair Standing balance comment: using rw for ambulation                             Pertinent Vitals/Pain Pain Assessment: Faces Faces Pain Scale: Hurts little more Pain Location: Rt hip Pain Descriptors / Indicators: Aching Pain Intervention(s): Limited activity within patient's tolerance;Monitored during session    [place] expects to be discharged to:: Private residence Living Arrangements: Spouse/significant other Available Help at Discharge: Family;Available 24 hours/day Type of Home: House Home Access: Stairs to enter Entrance Stairs-Rails: Chemical engineer of Steps: 4-5 Home Layout: One level Home Equipment: Walker - 2 wheels;Cane - single point     Prior Function Level of Independence: Independent               Hand Dominance        Extremity/Trunk Assessment   Upper Extremity Assessment Upper Extremity Assessment: Defer to OT evaluation    Lower Extremity Assessment Lower Extremity Assessment: RLE deficits/detail RLE Deficits / Details: fair quad activation       Communication   Communication: No difficulties  Cognition Arousal/Alertness: Awake/alert Behavior During Therapy: WFL for tasks assessed/performed Overall Cognitive Status: Within Functional Limits for tasks assessed  General Comments      Exercises     Assessment/Plan    PT Assessment Patient needs continued PT services  PT Problem List Decreased strength;Decreased  range of motion;Decreased activity tolerance;Decreased balance;Decreased mobility;Decreased knowledge of precautions          PT Treatment Interventions DME instruction;Gait training;Stair training;Functional mobility training;Therapeutic activities;Therapeutic exercise;Patient/family education    PT Goals (Current goals can be found in the Care Plan section)  Acute Rehab PT Goals Patient Stated Goal: get home PT Goal Formulation: With patient Time For Goal Achievement: 03/10/16 Potential to Achieve Goals: Good    Frequency 7X/week   Barriers to discharge        Co-evaluation               End of Session Equipment Utilized During Treatment: Gait belt Activity Tolerance: Patient tolerated treatment well Patient left:  (with OT) Nurse Communication: Mobility status;Precautions;Weight bearing status         Time: 1222-4114 PT Time Calculation (min) (ACUTE ONLY): 23 min   Charges:   PT Evaluation $PT Eval Moderate Complexity: 1 Procedure PT Treatments $Gait Training: 8-22 mins   PT G Codes:        Cassell Clement, PT, CSCS Pager [phone] Office [phone]  03/03/2016, 9:18 AM

## 2016-03-03 NOTE — Care Management Note (Signed)
Case Management Note  Patient Details  Name: [name] MRN: [id number] Date of Birth: February 04, 1955  Subjective/Objective:  Revision THA                  Action/Plan: Discharge Planning: AVS reviewed  NCM spoke to pt and offered choice for [facility]. Pt agreeable to [facility]. Contacted [facility] for [place]. Address [address], [place]. Has RW and bedside commode at home.   PCP TAPPER, DAVID B. MD  Expected Discharge Date:  03/03/16               Expected Discharge Plan:  [place]  In-House Referral:  NA  Discharge planning Services  CM Consult  Post Acute Care Choice:  Home Health Choice offered to:  Patient  DME Arranged:  N/A DME Agency:  NA  HH Arranged:  PT [place] Agency:  [facility] (formerly [employer])  Status of Service:  Completed, signed off  If discussed at [employer], dates discussed:    Additional Comments:  Erenest Rasher, RN 03/03/2016, 2:34 PM

## 2016-03-03 NOTE — Progress Notes (Signed)
Patient ID: [name], female   DOB: Apr 01, 1955, 61 y.o.   MRN: [id number] Subjective: 1 Day Post-Op Procedure(s) (LRB): TOTAL HIP REVISION (Right)    Patient reports pain as moderate.  But eager to get home as soon as therapy can review weight bearing status!!!  Objective:   VITALS:   Vitals:   03/03/16 0228 03/03/16 0557  BP: (!) 108/59 (!) 102/55  Pulse: 70 75  Resp: 18 18  Temp: 98.8 F (37.1 C) 98.9 F (37.2 C)    Neurovascular intact Incision: dressing C/D/I  LABS  Recent Labs  03/03/16 0459  HGB 8.3*  HCT 27.2*  WBC 12.9*  PLT 256     Recent Labs  03/03/16 0459  NA 138  K 4.1  BUN 18  CREATININE 0.78  GLUCOSE 121*    No results for input(s): LABPT, INR in the last 72 hours.   Assessment/Plan: 1 Day Post-Op Procedure(s) (LRB): TOTAL HIP REVISION (Right)   Advance diet Up with therapy, PWB RLE  Home after therapy RTC in 2 weeks No pain meds as she is under pain contract and has them at home

## 2016-03-03 NOTE — Progress Notes (Signed)
   03/03/16 0953  OT Visit Information  Last OT Received On 03/03/16  Assistance Needed +1  History of Present Illness revision posterior THA on the right; h/o several dislocations  Precautions  Precautions Posterior Hip;Fall  Precaution Booklet Issued Yes (comment)  Pain Assessment  Pain Assessment Faces  Faces Pain Scale 4  Pain Location R hip  Pain Descriptors / Indicators Sore  Pain Intervention(s) Limited activity within patient's tolerance;Monitored during session;Premedicated before session;Repositioned  Cognition  Arousal/Alertness Awake/alert  Behavior During Therapy WFL for tasks assessed/performed  Overall Cognitive Status Within Functional Limits for tasks assessed  ADL  Toilet Transfer Min guard;Ambulation;BSC;RW  Tub/ IT consultant shower;Min guard;Ambulation;3 in 1  General ADL Comments educated pt/spouse on shower transfer, and pt practiced this. Recommended 3;1 be placed on long wall of shower so pt can back right up to it.  Pt able to recall all precautions and state that she needs help for reaching below knees and that she needs to stand for hygiene.  Restrictions  RLE Weight Bearing PWB  RLE Partial Weight Bearing Percentage or Pounds 50%  Transfers  Equipment used Rolling walker (2 wheeled)  Sit to Stand Min guard  General transfer comment pt correctly placed hands and extended leg  OT - End of Session  Activity Tolerance Patient tolerated treatment well  Patient left in chair;with call bell/phone within reach;with chair alarm set;with family/visitor present  OT Assessment/Plan  Follow Up Recommendations Supervision/Assistance - 24 hour  OT Equipment None recommended by OT  OT Goal Progression  Progress towards OT goals Goals met/education completed, patient discharged from OT  OT Time Calculation  OT Start Time (ACUTE ONLY) 0910  OT Stop Time (ACUTE ONLY) 8485  OT Time Calculation (min) 12 min  OT General Charges  $OT Visit 1 Procedure  OT  Treatments  $Self Care/Home Management  8-22 mins  Lesle Chris, OTR/L [phone] 03/03/2016

## 2016-03-03 NOTE — Evaluation (Signed)
Occupational Therapy Evaluation Patient Details Name: [name] MRN: [id number] DOB: 08/01/1955 Today's Date: 03/03/2016    History of Present Illness revision posterior THA on the right; h/o several dislocations   Clinical Impression   This 61 year old female was admitted for the above sx.  She will benefit from one more session of OT:  Could not complete bathroom transfers due to pain. Goals are for min guard level for toilet and shower transfers    Follow Up Recommendations  Supervision/Assistance - 24 hour    Equipment Recommendations  None recommended by OT    Recommendations for Other Services       Precautions / Restrictions Precautions Precautions: Posterior Hip;Fall Precaution Booklet Issued: Yes (comment) Restrictions RLE Weight Bearing: Partial weight bearing RLE Partial Weight Bearing Percentage or Pounds: 50      Mobility Bed Mobility               General bed mobility comments: pt at EOB  Transfers Overall transfer level: Needs assistance Equipment used: Rolling walker (2 wheeled) Transfers: Sit to/from [facility] Sit to Stand: Min guard Stand pivot transfers: Min guard       General transfer comment: cues for UE/LE placement and PWB    Balance                                            ADL Overall ADL's : Needs assistance/impaired                         Toilet Transfer: Min guard;Stand-pivot;RW (chair)             General ADL Comments: pt was sitting at EOB when I arrived.  I assisted her to the chair while she awaited pain medication.  Reviewed precautions and demonstrated PWB.  Pt was not good with functional knowledge of precautions:  she was attempting to reach down and push ted hose down to look at shin.  Reviewed in detail, with modifications for adls.  Pt will have her husband assist her for adls.  She does have a reacher.     Vision     Perception     Praxis       Pertinent Vitals/Pain Pain Assessment: 0-10 Pain Score: 10-Worst pain ever Pain Location: R hip Pain Descriptors / Indicators: Aching Pain Intervention(s): Limited activity within patient's tolerance;Monitored during session;Ice applied;Repositioned;RN gave pain meds during session     Hand Dominance     Extremity/Trunk Assessment Upper Extremity Assessment Upper Extremity Assessment: Overall WFL for tasks assessed (pt reports R elbow fx a month ago)           Communication Communication Communication: No difficulties   Cognition Arousal/Alertness: Awake/alert Behavior During Therapy: WFL for tasks assessed/performed Overall Cognitive Status: Within Functional Limits for tasks assessed                     General Comments       Exercises       Shoulder Instructions      Home Living Family/patient expects to be discharged to:: Private residence Living Arrangements: Spouse/significant other Available Help at Discharge: Family               Bathroom Shower/Tub: Walk-in Psychologist, prison and probation services: Standard     Home Equipment: Bedside  commode   Additional Comments: has 2 elevated toilets also      Prior Functioning/Environment Level of Independence: Independent                 OT Problem List: Pain;Decreased knowledge of precautions;Decreased knowledge of use of DME or AE   OT Treatment/Interventions: Self-care/ADL training;DME and/or AE instruction;Patient/family education    OT Goals(Current goals can be found in the care plan section) Acute Rehab OT Goals Patient Stated Goal: home ASAP OT Goal Formulation: With patient Time For Goal Achievement: 03/04/16 Potential to Achieve Goals: Good ADL Goals Pt Will Transfer to Toilet: with min guard assist;ambulating;bedside commode Pt Will Perform Tub/Shower Transfer: with min guard assist;3 in 1;ambulating;Shower transfer Additional ADL Goal #1: pt will verbalize 3 posterior THPS and  verbalize modifications for bathing, dressing and hygiene  OT Frequency: Min 2X/week   Barriers to D/C:            Co-evaluation              End of Session    Activity Tolerance: Patient limited by pain Patient left: in chair;with call bell/phone within reach;with chair alarm set   Time: 0539-7673 OT Time Calculation (min): 22 min Charges:  OT General Charges $OT Visit: 1 Procedure OT Evaluation $OT Eval Low Complexity: 1 Procedure G-Codes:    Cayetano Mikita 03-12-2016, 8:58 AM Lesle Chris, OTR/L [phone] Mar 12, 2016

## 2016-03-03 NOTE — Op Note (Signed)
[name], CANDY NO.:  [id number]  MEDICAL RECORD NO.:  [id number]  LOCATION:                                FACILITY:  [facility]  PHYSICIAN:  Pietro Cassis. Alvan Dame, M.D.  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  PROCEDURE:  Revision right total hip arthroplasty utilizing Biomet MDM system with a size 56 G7 acetabular shell with a G7 44 F liner dual mobility acetabular liner with a size 44 x 28 mm dual mobility bearing active articulating hip system E1 antioxidant infused bearing with a 28 +1 point delta ceramic head for DePuy femoral stem.  I used 3 cancellous screws in this procedure.  SURGEON:  Pietro Cassis. Alvan Dame, M.D.  ASSISTANT:  Danae Orleans, PA-C.  Mr. [name] was present for the entirety of the case from preoperative position, perioperative positioning, general facilitation of the case and primary wound closure.  ANESTHESIA:  Spinal.  SPECIMENS:  None.  COMPLICATION:  None.  BLOOD LOSS:  300 mL.  DRAINS:  None.  INDICATIONS FOR PROCEDURE:  [name] is a pleasant 61 year old female with a history of an index right total hip arthroplasty performed at an outside facility in 2006.  [name] was seen and evaluated by myself after that time and had subsequent revision in 2010.  At that time, we revised her to a 40 mm Metal-on-Metal hip system.  Recently, [name] presented with mechanical symptoms to her hip with elevated metal ion levels in her serum as well as a positive fluid collection by MRI.  About 6 weeks to 8 weeks ago, I took her to the operating room and revised her right hip, removing the Metal-on-Metal bearing surface and exchanged for a ceramic-on- polyethylene insert.  At the time of that  evaluation, I did recognize based on my recollection, the acetabular shell that appeared to be a bit more anteverted than normal.  However, at the time I was worried about acetabular bone stock as the cup was significantly exposed in the posterior aspect of the hip.  At that time, I did just revise the liner and the ceramic head. Unfortunately, in the postoperative period, [name] has now had multiple episodes of dislocation.  I had seen her in the office after a recent dislocation and we both decided at the time to take her back to the operating room to revise her.  Unfortunately, prior to the scheduled surgery in mid February, [name] dislocated on February 27, 2016 and was admitted to the hospital for reduction.  At that time, I asked that [name] stay admitted to the hospital, so I could address this sooner than later.  Upon reviewing with her the indications for surgery again upon obtaining all the equipment necessary for the procedure, surgery was then scheduled.  Risks, benefits, and recurrent dislocation, infection were all discussed and reviewed.  Consent was obtained.  PROCEDURE IN DETAIL:  The patient was brought to the operative theater. Once adequate anesthesia preop  antibiotics, vancomycin and gentamicin administered, [name] was positioned into the left lateral decubitus position with the right side up.  The right lower extremity was then prepped and draped in sterile fashion.  Time-out was performed identifying the patient, planned procedure, and extremity.  Her prior incision was utilized.  Soft tissue dissection was carried down to the fascia layer, which was noted to have some dehiscence over the lateral trochanter.  [name] was noted to have a large hematoma in this area, but no signs of infection.  Upon creation and redefining her soft tissue layers, the posterior aspect of the hip was exposed.  At this point, I evaluated her hip under anesthesia and identified concerns as I thought  that with external rotation and extension, [name] was levering out of the posterior aspect of the hip coming out the front.  Given these findings and concerns about the position of the cup, I did elect at this point to revise her acetabulum.  Once I dislocated the hip and removed the femoral head, the femur was retracted anteriorly.  At this point, I had full visualization of the acetabulum.  Using the Innomed explant system, I removed the acetabular shell with minimal bone loss.  We removed at this point a 56 mm shell.  At this point, using Biomet reamers, I reamed with a 55 mm reamer and did not want to go up further in the size based on her bone stock and elected to use a 56 mm shell. The 56 mm G7 acetabular shell was then impacted with less anteversion and abduction about 35-40 degrees.  With decent secure initial fit, I did place 3 screws into the ilium to provide further initial fixation. However, I will have her be partial weightbearing in the postoperative period given this revision acetabulum.  At this point, I went ahead and placed the dual mobility acetabular liner which was a 44 mm bearing surface, size F liner.  This was impacted per technique without complication.  At this point, I did go ahead and open up the antioxidant dual mobility bearing surface from Biomet as well as a 28 +1.5 delta ceramic ball from DePuy which was then placed into the acetabular shell per routine using the device appropriate.  This head construct was then impacted on the clean and dried trunnion without difficulty.  The hip was then reduced.  We had irrigated the hip throughout the case and again at this point.  There was no significant posterior capsule reapproximate.  Thus, I then reapproximated the iliotibial band and gluteal fascia using interrupted #1 Vicryl suture and supported this with a STRATAFIX barbed suture.  The remainder of wound was closed in layers with 2-0 Vicryl and a running  Monocryl stitch.  This hip was then cleaned, dried, and dressed sterilely using surgical glue and an Aquacel dressing.  [name] was then woken from the IV sedation and brought to the recovery room in stable condition tolerating the procedure well. Findings were reviewed with her husband.  [name] will be in the hospital per her request hopefully just overnight.  We will have her be partial weightbearing for 6 weeks to allow for bone to heal into the acetabulum as will be reviewed.     Pietro Cassis Alvan Dame, M.D.   ______________________________ Pietro Cassis. Alvan Dame, M.D.    MDO/MEDQ  D:  03/02/2016  T:  03/03/2016  Job:  [id number]

## 2016-03-03 NOTE — Discharge Instructions (Signed)
INSTRUCTIONS AFTER JOINT REPLACEMENT   o Remove items at home which could result in a fall. This includes throw rugs or furniture in walking pathways o ICE to the affected joint every three hours while awake for 30 minutes at a time, for at least the first 3-5 days, and then as needed for pain and swelling.  Continue to use ice for pain and swelling. You may notice swelling that will progress down to the foot and ankle.  This is normal after surgery.  Elevate your leg when you are not up walking on it.   o Continue to use the breathing machine you got in the hospital (incentive spirometer) which will help keep your temperature down.  It is common for your temperature to cycle up and down following surgery, especially at night when you are not up moving around and exerting yourself.  The breathing machine keeps your lungs expanded and your temperature down.   DIET:  As you were doing prior to hospitalization, we recommend a well-balanced diet.  DRESSING / WOUND CARE / SHOWERING  Keep the surgical dressing until follow up.  The dressing is water proof, so you can shower without any extra covering.  IF THE DRESSING FALLS OFF or the wound gets wet inside, change the dressing with sterile gauze.  Please use good hand washing techniques before changing the dressing.  Do not use any lotions or creams on the incision until instructed by your surgeon.    ACTIVITY  o Increase activity slowly as tolerated, but follow the weight bearing instructions below.   o No driving for 6 weeks or until further direction given by your physician.  You cannot drive while taking narcotics.  o No lifting or carrying greater than 10 lbs. until further directed by your surgeon. o Avoid periods of inactivity such as sitting longer than an hour when not asleep. This helps prevent blood clots.  o You may return to work once you are authorized by your doctor.     WEIGHT BEARING   Partial weight bearing with assist device as  directed.  limit weight on right leg to make sure that bone will grow in to new component   EXERCISES  Results after joint replacement surgery are often greatly improved when you follow the exercise, range of motion and muscle strengthening exercises prescribed by your doctor. Safety measures are also important to protect the joint from further injury. Any time any of these exercises cause you to have increased pain or swelling, decrease what you are doing until you are comfortable again and then slowly increase them. If you have problems or questions, call your caregiver or physical therapist for advice.   Rehabilitation is important following a joint replacement. After just a few days of immobilization, the muscles of the leg can become weakened and shrink (atrophy).  These exercises are designed to build up the tone and strength of the thigh and leg muscles and to improve motion. Often times heat used for twenty to thirty minutes before working out will loosen up your tissues and help with improving the range of motion but do not use heat for the first two weeks following surgery (sometimes heat can increase post-operative swelling).   These exercises can be done on a training (exercise) mat, on the floor, on a table or on a bed. Use whatever works the best and is most comfortable for you.    Use music or television while you are exercising so that the exercises are  a pleasant break in your day. This will make your life better with the exercises acting as a break in your routine that you can look forward to.   Perform all exercises about fifteen times, three times per day or as directed.  You should exercise both the operative leg and the other leg as well.  Exercises include:    Quad Sets - Tighten up the muscle on the front of the thigh (Quad) and hold for 5-10 seconds.    Straight Leg Raises - With your knee straight (if you were given a brace, keep it on), lift the leg to 60 degrees, hold for  3 seconds, and slowly lower the leg.  Perform this exercise against resistance later as your leg gets stronger.   Leg Slides: Lying on your back, slowly slide your foot toward your buttocks, bending your knee up off the floor (only go as far as is comfortable). Then slowly slide your foot back down until your leg is flat on the floor again.   Angel Wings: Lying on your back spread your legs to the side as far apart as you can without causing discomfort.   Hamstring Strength:  Lying on your back, push your heel against the floor with your leg straight by tightening up the muscles of your buttocks.  Repeat, but this time bend your knee to a comfortable angle, and push your heel against the floor.  You may put a pillow under the heel to make it more comfortable if necessary.   A rehabilitation program following joint replacement surgery can speed recovery and prevent re-injury in the future due to weakened muscles. Contact your doctor or a physical therapist for more information on knee rehabilitation.    CONSTIPATION  Constipation is defined medically as fewer than three stools per week and severe constipation as less than one stool per week.  Even if you have a regular bowel pattern at home, your normal regimen is likely to be disrupted due to multiple reasons following surgery.  Combination of anesthesia, postoperative narcotics, change in appetite and fluid intake all can affect your bowels.   YOU MUST use at least one of the following options; they are listed in order of increasing strength to get the job done.  They are all available over the counter, and you may need to use some, POSSIBLY even all of these options:    Drink plenty of fluids (prune juice may be helpful) and high fiber foods Colace 100 mg by mouth twice a day  Senokot for constipation as directed and as needed Dulcolax (bisacodyl), take with full glass of water  Miralax (polyethylene glycol) once or twice a day as needed.  If  you have tried all these things and are unable to have a bowel movement in the first 3-4 days after surgery call either your surgeon or your primary doctor.    If you experience loose stools or diarrhea, hold the medications until you stool forms back up.  If your symptoms do not get better within 1 week or if they get worse, check with your doctor.  If you experience "the worst abdominal pain ever" or develop nausea or vomiting, please contact the office immediately for further recommendations for treatment.   ITCHING:  If you experience itching with your medications, try taking only a single pain pill, or even half a pain pill at a time.  You can also use Benadryl over the counter for itching or also to help with sleep.  TED HOSE STOCKINGS:  Use stockings on both legs until for at least 2 weeks or as directed by physician office. They may be removed at night for sleeping.  MEDICATIONS:  See your medication summary on the After Visit Summary that nursing will review with you.  You may have some home medications which will be placed on hold until you complete the course of blood thinner medication.  It is important for you to complete the blood thinner medication as prescribed.  PRECAUTIONS:  If you experience chest pain or shortness of breath - call 911 immediately for transfer to the hospital emergency department.   If you develop a fever greater that 101 F, purulent drainage from wound, increased redness or drainage from wound, foul odor from the wound/dressing, or calf pain - CONTACT YOUR SURGEON.                                                   FOLLOW-UP APPOINTMENTS:  If you do not already have a post-op appointment, please call the office for an appointment to be seen by your surgeon.  Guidelines for how soon to be seen are listed in your After Visit Summary, but are typically between 1-4 weeks after surgery.  OTHER INSTRUCTIONS:   Knee Replacement:  Do not place pillow under knee,  focus on keeping the knee straight while resting. CPM instructions: 0-90 degrees, 2 hours in the morning, 2 hours in the afternoon, and 2 hours in the evening. Place foam block, curve side up under heel at all times except when in CPM or when walking.  DO NOT modify, tear, cut, or change the foam block in any way.  MAKE SURE YOU:   Understand these instructions.   Get help right away if you are not doing well or get worse.    Thank you for letting us be a part of your medical care team.  It is a privilege we respect greatly.  We hope these instructions will help you stay on track for a fast and full recovery!

## 2016-03-05 ENCOUNTER — Emergency Department (HOSPITAL_COMMUNITY): Payer: BLUE CROSS/BLUE SHIELD

## 2016-03-05 ENCOUNTER — Encounter (HOSPITAL_COMMUNITY): Payer: Self-pay | Admitting: Emergency Medicine

## 2016-03-05 ENCOUNTER — Emergency Department (HOSPITAL_COMMUNITY)
Admission: EM | Admit: 2016-03-05 | Discharge: 2016-03-05 | Disposition: A | Discharge status: Home / Self Care | Payer: BLUE CROSS/BLUE SHIELD | Attending: Emergency Medicine | Admitting: Emergency Medicine

## 2016-03-05 DIAGNOSIS — X58XXXA Exposure to other specified factors, initial encounter: Secondary | ICD-10-CM | POA: Diagnosis not present

## 2016-03-05 DIAGNOSIS — E039 Hypothyroidism, unspecified: Secondary | ICD-10-CM | POA: Diagnosis not present

## 2016-03-05 DIAGNOSIS — Z7982 Long term (current) use of aspirin: Secondary | ICD-10-CM | POA: Diagnosis not present

## 2016-03-05 DIAGNOSIS — J449 Chronic obstructive pulmonary disease, unspecified: Secondary | ICD-10-CM | POA: Insufficient documentation

## 2016-03-05 DIAGNOSIS — Y9289 Other specified places as the place of occurrence of the external cause: Secondary | ICD-10-CM | POA: Insufficient documentation

## 2016-03-05 DIAGNOSIS — F17210 Nicotine dependence, cigarettes, uncomplicated: Secondary | ICD-10-CM | POA: Diagnosis not present

## 2016-03-05 DIAGNOSIS — I10 Essential (primary) hypertension: Secondary | ICD-10-CM | POA: Diagnosis not present

## 2016-03-05 DIAGNOSIS — Z96641 Presence of right artificial hip joint: Secondary | ICD-10-CM | POA: Insufficient documentation

## 2016-03-05 DIAGNOSIS — Y999 Unspecified external cause status: Secondary | ICD-10-CM | POA: Diagnosis not present

## 2016-03-05 DIAGNOSIS — Y9389 Activity, other specified: Secondary | ICD-10-CM | POA: Insufficient documentation

## 2016-03-05 DIAGNOSIS — S73004A Unspecified dislocation of right hip, initial encounter: Secondary | ICD-10-CM | POA: Insufficient documentation

## 2016-03-05 DIAGNOSIS — M25559 Pain in unspecified hip: Secondary | ICD-10-CM | POA: Diagnosis not present

## 2016-03-05 DIAGNOSIS — S79911A Unspecified injury of right hip, initial encounter: Secondary | ICD-10-CM | POA: Diagnosis present

## 2016-03-05 DIAGNOSIS — R52 Pain, unspecified: Secondary | ICD-10-CM | POA: Diagnosis not present

## 2016-03-05 DIAGNOSIS — Z5181 Encounter for therapeutic drug level monitoring: Secondary | ICD-10-CM | POA: Insufficient documentation

## 2016-03-05 LAB — CBC WITH DIFFERENTIAL/PLATELET
Basophils Absolute: 0 10*3/uL (ref 0.0–0.1)
Basophils Relative: 0 %
EOS PCT: 3 %
Eosinophils Absolute: 0.4 10*3/uL (ref 0.0–0.7)
HCT: 26.4 % — ABNORMAL LOW (ref 36.0–46.0)
HEMOGLOBIN: 8.2 g/dL — AB (ref 12.0–15.0)
LYMPHS ABS: 1.5 10*3/uL (ref 0.7–4.0)
LYMPHS PCT: 10 %
MCH: 26.3 pg (ref 26.0–34.0)
MCHC: 31.1 g/dL (ref 30.0–36.0)
MCV: 84.6 fL (ref 78.0–100.0)
MONOS PCT: 8 %
Monocytes Absolute: 1.2 10*3/uL — ABNORMAL HIGH (ref 0.1–1.0)
Neutro Abs: 12.3 10*3/uL — ABNORMAL HIGH (ref 1.7–7.7)
Neutrophils Relative %: 79 %
PLATELETS: 255 10*3/uL (ref 150–400)
RBC: 3.12 MIL/uL — ABNORMAL LOW (ref 3.87–5.11)
RDW: 18.7 % — ABNORMAL HIGH (ref 11.5–15.5)
WBC: 15.5 10*3/uL — AB (ref 4.0–10.5)

## 2016-03-05 LAB — PROTIME-INR
INR: 1.16
Prothrombin Time: 14.8 seconds (ref 11.4–15.2)

## 2016-03-05 LAB — BASIC METABOLIC PANEL
Anion gap: 10 (ref 5–15)
BUN: 18 mg/dL (ref 6–20)
CHLORIDE: 102 mmol/L (ref 101–111)
CO2: 25 mmol/L (ref 22–32)
Calcium: 8.1 mg/dL — ABNORMAL LOW (ref 8.9–10.3)
Creatinine, Ser: 1.21 mg/dL — ABNORMAL HIGH (ref 0.44–1.00)
GFR calc Af Amer: 55 mL/min — ABNORMAL LOW (ref >60)
GFR calc non Af Amer: 48 mL/min — ABNORMAL LOW (ref >60)
Glucose, Bld: 106 mg/dL — ABNORMAL HIGH (ref 65–99)
POTASSIUM: 3.2 mmol/L — AB (ref 3.5–5.1)
SODIUM: 137 mmol/L (ref 135–145)

## 2016-03-05 MED ORDER — PROPOFOL 10 MG/ML IV BOLUS
1.0000 mg/kg | Freq: Once | INTRAVENOUS | Status: AC
  Administered 2016-03-05: 95.8 mg via INTRAVENOUS
  Filled 2016-03-05: qty 20

## 2016-03-05 MED ORDER — FENTANYL CITRATE (PF) 100 MCG/2ML IJ SOLN
50.0000 ug | Freq: Once | INTRAMUSCULAR | Status: AC
  Administered 2016-03-05: 50 ug via INTRAVENOUS
  Filled 2016-03-05: qty 2

## 2016-03-05 MED ORDER — SODIUM CHLORIDE 0.9 % IV SOLN
Freq: Once | INTRAVENOUS | Status: AC
  Administered 2016-03-05: 10:00:00 via INTRAVENOUS

## 2016-03-05 MED ORDER — PROPOFOL 10 MG/ML IV BOLUS
INTRAVENOUS | Status: AC | PRN
  Administered 2016-03-05: 57.48 mg via INTRAVENOUS

## 2016-03-05 NOTE — Discharge Instructions (Signed)
Please be sure to follow up with Dr. Alvan Dame via telephone to ensure appropriate ongoing care.  Return here for concerning changes in your condition.

## 2016-03-05 NOTE — ED Provider Notes (Signed)
Procedural sedation Performed by: Wenda Overland Syrus Nakama Consent: Verbal consent obtained. Risks and benefits: risks, benefits and alternatives were discussed Required items: required blood products, implants, devices, and special equipment available Patient identity confirmed: arm band and provided demographic data Time out: Immediately prior to procedure a "time out" was called to verify the correct patient, procedure, equipment, support staff and site/side marked as required.  Sedation type: moderate (conscious) sedation NPO time confirmed and considedered  Sedatives: PROPOFOL  Physician Time at Bedside: 15 minutes  Vitals: Vital signs were monitored during sedation. Cardiac Monitor, pulse oximeter Patient tolerance: Patient tolerated the procedure well with no immediate complications. Comments: Pt with uneventful recovered. Returned to pre-procedural sedation baseline      Sharlett Iles, MD 03/05/16 [phone]

## 2016-03-05 NOTE — ED Notes (Signed)
Bed: BT24 Expected date:  Expected time:  Means of arrival:  Comments: EMS 60 yo HIP

## 2016-03-05 NOTE — ED Provider Notes (Signed)
[facility] DEPT Provider Note   CSN: [id number] Arrival date & time: 03/05/16  0910     History   Chief Complaint Chief Complaint  Patient presents with  . Hip Injury    right     HPI [name] is a 61 y.o. female.  HPI  She presents after the acute onset of right hip pain. Just prior to calling EMS the patient was attempting to use the bathroom. Patient recalls sitting, and then felt the sudden onset of pain in the right hip. Since that time she has been nonambulatory, with severe non-radiopaque pain throughout the right hip. No subsequent fall. Patient notes that over the past 2 months she has had 2 surgical revisions of her total right hip, has had possibly 6 dislocations. Patient was in the operating room 3 days ago for her most recent procedure. No she describes diffuse generalized discomfort, she denies other substantial current medical concerns.   Past Medical History:  Diagnosis Date  . Allergic rhinitis   . Anemia   . Anxiety   . Chronic back pain   . COPD (chronic obstructive pulmonary disease) ([place])   . Depression   . Essential hypertension   . GERD (gastroesophageal reflux disease)   . Headache    migraines  . History of gastritis    EGD 2015  . History of home oxygen therapy    2 liters at hs last 6 months  . Hypothyroidism   . Osteoarthritis    oa  . Scoliosis     Patient Active Problem List   Diagnosis Date Noted  . Instability of prosthetic hip ([place]) 03/01/2016  . Hip dislocation, right ([place]) 02/28/2016  . Dislocation of hip prosthesis ([place]) 02/17/2016  . Anterior dislocation of right hip ([place]) 01/19/2016  . S/P revision of total hip 01/01/2016  . Abnormal nuclear stress test: Intermediate Risk 10/31/2015  . Atypical angina ([place]) 10/31/2015  . Hip bursitis 08/20/2012  . Trigger finger, acquired 08/20/2012  . TOBACCO USER 12/12/2008  . DYSLIPIDEMIA 11/17/2008  . ANXIETY DISORDER 11/16/2008  . HYPERTENSION, BENIGN 11/16/2008    . GEN OSTEOARTHROSIS INVOLVING MULTIPLE SITES 11/16/2008  . CHEST DISCOMFORT, ATYPICAL 11/16/2008    Past Surgical History:  Procedure Laterality Date  . APPENDECTOMY     1985  . CARDIAC CATHETERIZATION N/A 10/31/2015   Procedure: Left Heart Cath and Coronary Angiography;  Surgeon: Leonie Man, MD;  Location: [facility];  Service: Cardiovascular;  Laterality: N/A;  . CARPAL TUNNEL RELEASE Left   . CARPAL TUNNEL RELEASE Right   . CHOLECYSTECTOMY  late 1980's  . HIP CLOSED REDUCTION Right 01/08/2016   Procedure: CLOSED MANIPULATION HIP;  Surgeon: Susa Day, MD;  Location: [facility];  Service: Orthopedics;  Laterality: Right;  . HIP CLOSED REDUCTION Right 01/19/2016   Procedure: ATTEMPTED CLOSED REDUCTION RIGHT HIP;  Surgeon: Wylene Simmer, MD;  Location: [facility];  Service: Orthopedics;  Laterality: Right;  . HIP CLOSED REDUCTION Right 01/20/2016   Procedure: CLOSED REDUCTION RIGHT TOTAL HIP;  Surgeon: Paralee Cancel, MD;  Location: [facility];  Service: Orthopedics;  Laterality: Right;  . HIP CLOSED REDUCTION Right 02/17/2016   Procedure: CLOSED REDUCTION RIGHT TOTAL HIP;  Surgeon: Rod Can, MD;  Location: [facility];  Service: Orthopedics;  Laterality: Right;  . HIP CLOSED REDUCTION Right 02/28/2016   Procedure: CLOSED REDUCTION HIP;  Surgeon: Nicholes Stairs, MD;  Location: [facility];  Service: Orthopedics;  Laterality: Right;  . TONSILLECTOMY    .  TOTAL ABDOMINAL HYSTERECTOMY     1985, with 1 ovary removed and 2 nd ovary removed 2003  . TOTAL HIP ARTHROPLASTY Right    Original surgery 2006 with revision 2010  . TOTAL HIP REVISION Right 01/01/2016   Procedure: TOTAL HIP REVISION;  Surgeon: Paralee Cancel, MD;  Location: [facility];  Service: Orthopedics;  Laterality: Right;  . ULNAR NERVE TRANSPOSITION Right     OB History    No data available       Home Medications    Prior to Admission medications   Medication Sig Start Date End Date Taking? Authorizing Provider   acetaminophen (TYLENOL) 325 MG tablet Take 2 tablets (650 mg total) by mouth every 6 (six) hours as needed for mild pain (or Fever >/= 101). 03/02/16   Danae Orleans, PA-C  ALPRAZolam Duanne Moron) 1 MG tablet Take 1 mg by mouth 4 (four) times daily.    Historical Provider, MD  aspirin 81 MG chewable tablet Chew 1 tablet (81 mg total) by mouth 2 (two) times daily. Take for 4 weeks. 03/03/16 04/02/16  Danae Orleans, PA-C  Aspirin-Acetaminophen-Caffeine (GOODY HEADACHE PO) Take 1 packet by mouth daily as needed (pain).     Historical Provider, MD  atorvastatin (LIPITOR) 20 MG tablet Take 20 mg by mouth at bedtime.     Historical Provider, MD  docusate sodium (COLACE) 100 MG capsule Take 1 capsule (100 mg total) by mouth 2 (two) times daily. 03/02/16   Danae Orleans, PA-C  ferrous sulfate 325 (65 FE) MG tablet Take 1 tablet (325 mg total) by mouth 3 (three) times daily after meals. 03/02/16   Danae Orleans, PA-C  fluticasone (FLONASE) 50 MCG/ACT nasal spray Place 2 sprays into both nostrils daily.    Historical Provider, MD  levothyroxine (SYNTHROID, LEVOTHROID) 75 MCG tablet Take 75 mcg by mouth daily before breakfast.    Historical Provider, MD  loratadine (CLARITIN) 10 MG tablet Take 10 mg by mouth daily.    Historical Provider, MD  methocarbamol (ROBAXIN) 500 MG tablet Take 1 tablet (500 mg total) by mouth every 6 (six) hours as needed for muscle spasms. 03/02/16   Danae Orleans, PA-C  omeprazole (PRILOSEC) 20 MG capsule Take 20 mg by mouth daily.    Historical Provider, MD  ondansetron (ZOFRAN) 4 MG tablet Take 1 tablet (4 mg total) by mouth every 6 (six) hours as needed for nausea. 03/02/16   Danae Orleans, PA-C  oxyCODONE (OXY IR/ROXICODONE) 5 MG immediate release tablet Take 1-2 tablets (5-10 mg total) by mouth every 4 (four) hours as needed for moderate pain, severe pain or breakthrough pain. 03/02/16   Danae Orleans, PA-C  OXYGEN Inhale into the lungs. 2 liters at hs    Historical Provider, MD  polyethylene  glycol (MIRALAX / GLYCOLAX) packet Take 17 g by mouth 2 (two) times daily. 03/02/16   Danae Orleans, PA-C  potassium chloride (K-DUR) 10 MEQ tablet Take 10 mEq by mouth at bedtime.     Historical Provider, MD  ranitidine (ZANTAC) 150 MG capsule Take 300 mg by mouth at bedtime.     Historical Provider, MD  sertraline (ZOLOFT) 100 MG tablet Take 150 mg by mouth at bedtime.     Historical Provider, MD  Tetrahydrozoline HCl (EYE DROPS OP) Apply 2 drops to eye daily as needed (dry eyes).     Historical Provider, MD  triamterene-hydrochlorothiazide (DYAZIDE) 37.5-25 MG per capsule Take 1 capsule by mouth every morning.    Historical Provider, MD    [facility]  History Family History  Problem Relation Age of Onset  . COPD Mother   . Lung disease Father     Asbestosis  . Heart attack Father   . Cerebral aneurysm Brother     Social History Social History  Substance Use Topics  . Smoking status: Current Every Day Smoker    Packs/day: 0.50    Years: 40.00    Types: Cigarettes  . Smokeless tobacco: Never Used  . Alcohol use No     Allergies   Aleve [naproxen sodium]; Codeine; Penicillins; and Sulfonamide derivatives   Review of Systems Review of Systems   Physical Exam Updated Vital Signs BP 130/77 (BP Location: Left Arm)   Pulse 104   Temp 97.8 F (36.6 C) (Oral)   Resp 18   SpO2 95% Comment: Simultaneous filing. User may not have seen previous data.  Physical Exam  Constitutional: She is oriented to person, place, and time. She appears well-developed.  Uncomfortable appearing female lying supine  HENT:  Head: Normocephalic and atraumatic.  Eyes: Conjunctivae and EOM are normal.  Cardiovascular: Normal rate and regular rhythm.   Pulmonary/Chest: Effort normal and breath sounds normal. No stridor. No respiratory distress.  Abdominal: She exhibits no distension.  Musculoskeletal: She exhibits no edema.  Gross deformity of the right hip, with external rotation and shortening of  the extremity. Patient unwilling to move the right leg secondary to pain. Patient can flex left hip to command, has no gross deformity on the left side.  Neurological: She is alert and oriented to person, place, and time. No cranial nerve deficit.  Aside from incomplete evaluation of the right leg secondary to pain, neurologic exam otherwise unremarkable.  Skin: Skin is warm and dry.  Psychiatric: She has a normal mood and affect.  Nursing note and vitals reviewed.  Chart review notable for multiple recent hip dislocations, operative repair 3 days ago  ED Treatments / Results  Labs (all labs ordered are listed, but only abnormal results are displayed) Labs Reviewed  BASIC METABOLIC PANEL - Abnormal; Notable for the following:       Result Value   Potassium 3.2 (*)    Glucose, Bld 106 (*)    Creatinine, Ser 1.21 (*)    Calcium 8.1 (*)    GFR calc non Af Amer 48 (*)    GFR calc Af Amer 55 (*)    All other components within normal limits  CBC WITH DIFFERENTIAL/PLATELET - Abnormal; Notable for the following:    WBC 15.5 (*)    RBC 3.12 (*)    Hemoglobin 8.2 (*)    HCT 26.4 (*)    RDW 18.7 (*)    Neutro Abs 12.3 (*)    Monocytes Absolute 1.2 (*)    All other components within normal limits  PROTIME-INR    Radiology Dg Hip Unilat W Or Wo Pelvis 2-3 Views Right  Result Date: 03/05/2016 CLINICAL DATA:  Patient states she sat on toilet this am and felt sharp pain. Hx. Dislocation 7 times per patient. EXAM: DG HIP (WITH OR WITHOUT PELVIS) 2-3V RIGHT COMPARISON:  02/30/2018 FINDINGS: Proximal on lateral dislocation of the femoral head component of the right hip prosthesis with respect to the acetabular shell. No visible fracture. IMPRESSION: 1. Right prosthetic hip dislocation. Electronically Signed   By: Van Clines M.D.   On: 03/05/2016 10:52    Procedures ORTHOPEDIC INJURY TREATMENT Date/Time: 03/05/2016 12:10 PM Performed by: Carmin Muskrat Authorized by: Carmin Muskrat   Consent: The procedure  was performed in an emergent situation. Verbal consent obtained. Risks and benefits: risks, benefits and alternatives were discussed Consent given by: patient Patient understanding: patient states understanding of the procedure being performed Patient consent: the patient's understanding of the procedure matches consent given Procedure consent: procedure consent matches procedure scheduled Relevant documents: relevant documents present and verified Test results: test results available and properly labeled Site marked: the operative site was marked Imaging studies: imaging studies available Required items: required blood products, implants, devices, and special equipment available Patient identity confirmed: verbally with patient Time out: Immediately prior to procedure a "time out" was called to verify the correct patient, procedure, equipment, support staff and site/side marked as required. Injury location: hip Injury type: dislocation Dislocation type: anterior Spontaneous dislocation: yes Prosthesis: yes Pre-procedure neurovascular assessment: neurovascularly intact Pre-procedure distal perfusion: normal Pre-procedure neurological function: normal Pre-procedure range of motion: reduced  Anesthesia: Local anesthesia used: no  Sedation: Patient sedated: yes - see details on note from Dr. Rex Kras. Manipulation performed: yes Reduction method: traction, internal rotation. Reduction successful: yes X-ray confirmed reduction: physiologic confirmation. Immobilization: brace Splint type: knee immobilizer. Post-procedure neurovascular assessment: post-procedure neurovascularly intact Post-procedure distal perfusion: normal Post-procedure neurological function: normal Post-procedure range of motion: unchanged Patient tolerance: Patient tolerated the procedure well with no immediate complications    (including critical care time)  Medications Ordered in  ED Medications  0.9 %  sodium chloride infusion ( Intravenous New Bag/Given 03/05/16 1020)  fentaNYL (SUBLIMAZE) injection 50 mcg (50 mcg Intravenous Given 03/05/16 1017)  propofol (DIPRIVAN) 10 mg/mL bolus/IV push 95.8 mg (95.8 mg Intravenous Given 03/05/16 1221)  fentaNYL (SUBLIMAZE) injection 50 mcg (50 mcg Intravenous Given 03/05/16 1219)  propofol (DIPRIVAN) 10 mg/mL bolus/IV push (57.48 mg Intravenous Given 03/05/16 1221)     Initial Impression / Assessment and Plan / ED Course  I have reviewed the triage vital signs and the nursing notes.  Pertinent labs & imaging results that were available during my care of the patient were reviewed by me and considered in my medical decision making (see chart for details).  11:55 AM Patient still in pain.  I discussed her presentation with her surgeon, Dr. Alvan Dame.  Reduction attempt to be performed in the ED.  Subsequent, with knowledge the patient has had multiple attempts with difficulty for reduction in the emergency department and was to the assistance of Dr. Rex Kras for conscious sedation, while the patient had reduction. Hip dislocation reduction was successfully performed, without complication.    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 After discussion of risks and benefits, and discussion with the patient's surgeon, patient had successful reduction of her prostatic hip dislocation. Patient tolerated sedation well, had no complications, and was discharged to follow-up with her orthopedist.     Carmin Muskrat, MD 03/05/16 1323

## 2016-03-05 NOTE — ED Triage Notes (Signed)
Pt reports her 6th hip dislocation. Obvious right leg shortening . Was given total of 10 mg Morphine IV by EMS. Pt in obvious pain. Alert and oriented x 4.

## 2016-03-06 ENCOUNTER — Encounter (HOSPITAL_COMMUNITY): Payer: Self-pay | Admitting: Orthopedic Surgery

## 2016-03-11 NOTE — Discharge Summary (Signed)
Physician Discharge Summary  Patient ID: [name] MRN: [id number] DOB/AGE: 60-Oct-1957 60 y.o.  Admit date: 02/27/2016 Discharge date: 03/03/2016   Procedures:  Procedure(s) (LRB): TOTAL HIP REVISION (Right)  Attending Physician:  Dr. Paralee Cancel   Admission Diagnoses:   Right hip dislocation  Discharge Diagnoses:  Active Problems:   Dislocation of hip prosthesis (H[facility])   Hip dislocation, right ([facility])   Instability of prosthetic hip ([facility])  Past Medical History:  Diagnosis Date  . Allergic rhinitis   . Anemia   . Anxiety   . Chronic back pain   . COPD (chronic obstructive pulmonary disease) ([place])   . Depression   . Essential hypertension   . GERD (gastroesophageal reflux disease)   . Headache    migraines  . History of gastritis    EGD 2015  . History of home oxygen therapy    2 liters at hs last 6 months  . Hypothyroidism   . Osteoarthritis    oa  . Scoliosis     HPI:    [name] is a 61 y.o. female who complains of right hip pain following a dislocation on 02/27/2015 yesterday evening.  She was standing at her sink and felt the hip dislocate.  She has hx of right hip instability and is scheduled for revision next month with Dr. Alvan Dame.  She was seen early this am in the ED and had a failed attempt at closed reduction of the right hip.  PCP: Deloria Lair, MD   Discharged Condition: good  Hospital Course:  Patient was admitted to the hospital on 02/27/2016.  Patient underwent a closed reduction under anesthesia on 02/28/2016, per Dr. Stann Mainland.  The patient is at high risk for a re-dislocation and Dr. Stann Mainland discussed the case with Dr. Alvan Dame.  Do to the high risk of dislocation patient stayed in the hospital and had an uneventful course until she underwent a revision of the right THA on 03/02/2016.  Patient tolerated the procedure well and brought to the recovery room in good condition and subsequently to the floor.  POD #1 BP: 102/55 ; Pulse: 75 ; Temp: 98.9 F  (37.2 C) ; Resp: 18 Patient reports pain as moderate.  But eager to get home as soon as therapy can review weight bearing status! Dorsiflexion/plantar flexion intact, incision: dressing C/D/I, no cellulitis present and compartment soft.   LABS  Basename    HGB     8.3  HCT     27.2    Discharge Exam: General appearance: alert, cooperative and no distress Extremities: Homans sign is negative, no sign of DVT, no edema, redness or tenderness in the calves or thighs and no ulcers, gangrene or trophic changes  Disposition: Home with follow up in 2 weeks   Follow-up Information    Mauri Pole, MD. Schedule an appointment as soon as possible for a visit in 2 week(s).   Specialty:  Orthopedic Surgery Contact information: [address] [place] [address] [phone]           Discharge Instructions    Call MD / Call 911    Complete by:  As directed    If you experience chest pain or shortness of breath, CALL 911 and be transported to the hospital emergency room.  If you develope a fever above 101 F, pus (white drainage) or increased drainage or redness at the wound, or calf pain, call your surgeon's office.   Call MD / Call 911  Complete by:  As directed    If you experience chest pain or shortness of breath, CALL 911 and be transported to the hospital emergency room.  If you develope a fever above 101 F, pus (white drainage) or increased drainage or redness at the wound, or calf pain, call your surgeon's office.   Change dressing    Complete by:  As directed    Maintain surgical dressing until follow up in the clinic. If the edges start to pull up, may reinforce with tape. If the dressing is no longer working, may remove and cover with gauze and tape, but must keep the area dry and clean.  Call with any questions or concerns.   Constipation Prevention    Complete by:  As directed    Drink plenty of fluids.  Prune juice may be helpful.  You may use a stool  softener, such as Colace (over the counter) 100 mg twice a day.  Use MiraLax (over the counter) for constipation as needed.   Constipation Prevention    Complete by:  As directed    Drink plenty of fluids.  Prune juice may be helpful.  You may use a stool softener, such as Colace (over the counter) 100 mg twice a day.  Use MiraLax (over the counter) for constipation as needed.   Diet - low sodium heart healthy    Complete by:  As directed    Diet - low sodium heart healthy    Complete by:  As directed    Discharge instructions    Complete by:  As directed    Maintain surgical dressing until follow up in the clinic. If the edges start to pull up, may reinforce with tape. If the dressing is no longer working, may remove and cover with gauze and tape, but must keep the area dry and clean.  Follow up in 2 weeks at [facility]. Call with any questions or concerns.   Discharge instructions    Complete by:  As directed    No more than 50 % weight bearing right lower extremity for 6 weeks May shower keep dressing on until follow up   Follow the hip precautions as taught in Physical Therapy    Complete by:  As directed    Increase activity slowly as tolerated    Complete by:  As directed    Partial weight bearing    Complete by:  As directed    Laterality:  right   Extremity:  Lower   TED hose    Complete by:  As directed    Use stockings (TED hose) for 2 weeks on both leg(s).  You may remove them at night for sleeping.      Allergies as of 03/03/2016      Reactions   Aleve [naproxen Sodium] Other (See Comments)   Headache   Codeine    REACTION: GI upset   Penicillins Other (See Comments)   GI upset Has patient had a PCN reaction causing immediate rash, facial/tongue/throat swelling, SOB or lightheadedness with hypotension: No Has patient had a PCN reaction causing severe rash involving mucus membranes or skin necrosis: No Has patient had a PCN reaction that required  hospitalization No Has patient had a PCN reaction occurring within the last 10 years: No If all of the above answers are "NO", then may proceed with Cephalosporin use.   Sulfonamide Derivatives Hives      Medication List    STOP taking these medications  HYDROcodone-acetaminophen 10-325 MG tablet Commonly known as:  NORCO   nitroGLYCERIN 0.4 MG SL tablet Commonly known as:  NITROSTAT     TAKE these medications   acetaminophen 325 MG tablet Commonly known as:  TYLENOL Take 2 tablets (650 mg total) by mouth every 6 (six) hours as needed for mild pain (or Fever >/= 101).   ALPRAZolam 1 MG tablet Commonly known as:  XANAX Take 1 mg by mouth 4 (four) times daily.   aspirin 81 MG chewable tablet Chew 1 tablet (81 mg total) by mouth 2 (two) times daily. Take for 4 weeks.   atorvastatin 20 MG tablet Commonly known as:  LIPITOR Take 20 mg by mouth at bedtime.   docusate sodium 100 MG capsule Commonly known as:  COLACE Take 1 capsule (100 mg total) by mouth 2 (two) times daily.   EYE DROPS OP Apply 2 drops to eye daily as needed (dry eyes).   ferrous sulfate 325 (65 FE) MG tablet Take 1 tablet (325 mg total) by mouth 3 (three) times daily after meals.   fluticasone 50 MCG/ACT nasal spray Commonly known as:  FLONASE Place 2 sprays into both nostrils daily.   GOODY HEADACHE PO Take 1 packet by mouth daily as needed (pain).   levothyroxine 75 MCG tablet Commonly known as:  SYNTHROID, LEVOTHROID Take 75 mcg by mouth daily before breakfast.   loratadine 10 MG tablet Commonly known as:  CLARITIN Take 10 mg by mouth daily.   methocarbamol 500 MG tablet Commonly known as:  ROBAXIN Take 1 tablet (500 mg total) by mouth every 6 (six) hours as needed for muscle spasms.   omeprazole 20 MG capsule Commonly known as:  PRILOSEC Take 20 mg by mouth daily.   ondansetron 4 MG tablet Commonly known as:  ZOFRAN Take 1 tablet (4 mg total) by mouth every 6 (six) hours as needed  for nausea.   oxyCODONE 5 MG immediate release tablet Commonly known as:  Oxy IR/ROXICODONE Take 1-2 tablets (5-10 mg total) by mouth every 4 (four) hours as needed for moderate pain, severe pain or breakthrough pain.   OXYGEN Inhale 2 L into the lungs at bedtime.   polyethylene glycol packet Commonly known as:  MIRALAX / GLYCOLAX Take 17 g by mouth 2 (two) times daily.   potassium chloride 10 MEQ tablet Commonly known as:  K-DUR Take 10 mEq by mouth at bedtime.   ranitidine 150 MG capsule Commonly known as:  ZANTAC Take 300 mg by mouth at bedtime.   sertraline 100 MG tablet Commonly known as:  ZOLOFT Take 150 mg by mouth at bedtime.   triamterene-hydrochlorothiazide 37.5-25 MG capsule Commonly known as:  DYAZIDE Take 1 capsule by mouth every morning.         Signed: [place]. Cole Klugh   PA-C  03/11/2016, 2:01 PM

## 2016-03-27 DIAGNOSIS — S52111D Torus fracture of upper end of right radius, subsequent encounter for fracture with routine healing: Secondary | ICD-10-CM | POA: Diagnosis not present

## 2016-04-17 ENCOUNTER — Emergency Department (HOSPITAL_COMMUNITY)
Admission: EM | Admit: 2016-04-17 | Discharge: 2016-04-17 | Disposition: A | Discharge status: Home / Self Care | Payer: BLUE CROSS/BLUE SHIELD | Attending: Emergency Medicine | Admitting: Emergency Medicine

## 2016-04-17 ENCOUNTER — Emergency Department (HOSPITAL_COMMUNITY): Payer: BLUE CROSS/BLUE SHIELD

## 2016-04-17 ENCOUNTER — Encounter (HOSPITAL_COMMUNITY): Payer: Self-pay | Admitting: *Deleted

## 2016-04-17 DIAGNOSIS — I10 Essential (primary) hypertension: Secondary | ICD-10-CM | POA: Insufficient documentation

## 2016-04-17 DIAGNOSIS — X58XXXD Exposure to other specified factors, subsequent encounter: Secondary | ICD-10-CM | POA: Insufficient documentation

## 2016-04-17 DIAGNOSIS — Z9889 Other specified postprocedural states: Secondary | ICD-10-CM

## 2016-04-17 DIAGNOSIS — S79911D Unspecified injury of right hip, subsequent encounter: Secondary | ICD-10-CM | POA: Diagnosis present

## 2016-04-17 DIAGNOSIS — S73004D Unspecified dislocation of right hip, subsequent encounter: Secondary | ICD-10-CM

## 2016-04-17 DIAGNOSIS — J449 Chronic obstructive pulmonary disease, unspecified: Secondary | ICD-10-CM | POA: Diagnosis not present

## 2016-04-17 DIAGNOSIS — F17210 Nicotine dependence, cigarettes, uncomplicated: Secondary | ICD-10-CM | POA: Diagnosis not present

## 2016-04-17 DIAGNOSIS — Z96641 Presence of right artificial hip joint: Secondary | ICD-10-CM | POA: Diagnosis not present

## 2016-04-17 MED ORDER — SODIUM CHLORIDE 0.9 % IV BOLUS (SEPSIS)
500.0000 mL | Freq: Once | INTRAVENOUS | Status: AC
  Administered 2016-04-17: 500 mL via INTRAVENOUS

## 2016-04-17 MED ORDER — PROPOFOL 10 MG/ML IV BOLUS
1.0000 mg/kg | Freq: Once | INTRAVENOUS | Status: AC
  Administered 2016-04-17: 90 mg via INTRAVENOUS
  Filled 2016-04-17: qty 20

## 2016-04-17 MED ORDER — HYDROMORPHONE HCL 1 MG/ML IJ SOLN
1.0000 mg | Freq: Once | INTRAMUSCULAR | Status: AC
  Administered 2016-04-17: 1 mg via INTRAVENOUS
  Filled 2016-04-17: qty 1

## 2016-04-17 MED ORDER — PROPOFOL 10 MG/ML IV BOLUS
1.0000 mg/kg | Freq: Once | INTRAVENOUS | Status: AC
  Administered 2016-04-17: 95.7 mg via INTRAVENOUS
  Filled 2016-04-17: qty 20

## 2016-04-17 MED ORDER — HYDROMORPHONE HCL 1 MG/ML IJ SOLN
1.0000 mg | INTRAMUSCULAR | Status: DC | PRN
  Administered 2016-04-17: 1 mg via INTRAVENOUS
  Filled 2016-04-17: qty 1

## 2016-04-17 NOTE — ED Triage Notes (Addendum)
Pt reports that around 1300 she fell and felt her right hip dislocate when she was bending over to pick up something from the floor. Pt reports this is the 7th dislocation. Also pt reports she has had 2 sx since on right hip. Pt received '8mg'$  morphine PTA; pt reports pain 10/10.

## 2016-04-17 NOTE — ED Notes (Signed)
Bed: WA07 Expected date:  Expected time:  Means of arrival:  Comments: EMS-hip pain

## 2016-04-17 NOTE — Consult Note (Signed)
Reason for Consult:right THA dislocation Referring Physician: EDP  HPI: [name] is an 61 y.o. female s/p R THA revision 03/02/16, presents with second post revision R hip D/L, reports not following total hip precautions nad bent over at home flexing hip beyond 90 degrees  Past Medical History:  Diagnosis Date  . Allergic rhinitis   . Anemia   . Anxiety   . Chronic back pain   . COPD (chronic obstructive pulmonary disease) ([place])   . Depression   . Essential hypertension   . GERD (gastroesophageal reflux disease)   . Headache    migraines  . History of gastritis    EGD 2015  . History of home oxygen therapy    2 liters at hs last 6 months  . Hypothyroidism   . Osteoarthritis    oa  . Scoliosis     Past Surgical History:  Procedure Laterality Date  . APPENDECTOMY     1985  . CARDIAC CATHETERIZATION N/A 10/31/2015   Procedure: Left Heart Cath and Coronary Angiography;  Surgeon: Leonie Man, MD;  Location: [place] [facility];  Service: Cardiovascular;  Laterality: N/A;  . CARPAL TUNNEL RELEASE Left   . CARPAL TUNNEL RELEASE Right   . CHOLECYSTECTOMY  late 1980's  . HIP CLOSED REDUCTION Right 01/08/2016   Procedure: CLOSED MANIPULATION HIP;  Surgeon: Susa Day, MD;  [facility]: [facility];  Service: Orthopedics;  Laterality: Right;  . HIP CLOSED REDUCTION Right 01/19/2016   Procedure: ATTEMPTED CLOSED REDUCTION RIGHT HIP;  Surgeon: Wylene Simmer, MD;  Location: [facility];  Service: Orthopedics;  Laterality: Right;  . HIP CLOSED REDUCTION Right 01/20/2016   Procedure: CLOSED REDUCTION RIGHT TOTAL HIP;  Surgeon: Paralee Cancel, MD;  Location: [facility];  Service: Orthopedics;  Laterality: Right;  . HIP CLOSED REDUCTION Right 02/17/2016   Procedure: CLOSED REDUCTION RIGHT TOTAL HIP;  Surgeon: Rod Can, MD;  Location: [place] [place];  Service: Orthopedics;  Laterality: Right;  . HIP CLOSED REDUCTION Right 02/28/2016   Procedure: CLOSED REDUCTION HIP;  Surgeon: Nicholes Stairs, MD;   Location: [facility];  Service: Orthopedics;  Laterality: Right;  . TONSILLECTOMY    . TOTAL ABDOMINAL HYSTERECTOMY     1985, with 1 ovary removed and 2 nd ovary removed 2003  . TOTAL HIP ARTHROPLASTY Right    Original surgery 2006 with revision 2010  . TOTAL HIP REVISION Right 01/01/2016   Procedure: TOTAL HIP REVISION;  Surgeon: Paralee Cancel, MD;  [facility];  Service: Orthopedics;  Laterality: Right;  . TOTAL HIP REVISION Right 03/02/2016   Procedure: TOTAL HIP REVISION;  Surgeon: Paralee Cancel, MD;  [facility]: [facility];  Service: Orthopedics;  Laterality: Right;  . ULNAR NERVE TRANSPOSITION Right     Family History  Problem Relation Age of Onset  . COPD Mother   . Lung disease Father     Asbestosis  . Heart attack Father   . Cerebral aneurysm Brother     Social History:  reports that she has been smoking Cigarettes.  She has a 20.00 pack-year smoking history. She has never used smokeless tobacco. She reports that she does not drink alcohol or use drugs.  Allergies:  Allergies  Allergen Reactions  . Aleve [Naproxen Sodium] Other (See Comments)    Headache   . Codeine     REACTION: GI upset  . Penicillins Other (See Comments)    GI upset Has patient had a PCN reaction causing immediate rash, facial/tongue/throat swelling, SOB or lightheadedness  with hypotension: No Has patient had a PCN reaction causing severe rash involving mucus membranes or skin necrosis: No Has patient had a PCN reaction that required hospitalization No Has patient had a PCN reaction occurring within the last 10 years: No If all of the above answers are "NO", then may proceed with Cephalosporin use.   . Sulfonamide Derivatives Hives    Medications: Prior to Admission:  (Not in a hospital admission)  No results found for this or any previous visit (from the past 48 hour(s)).  Dg Hip Unilat W Or Wo Pelvis 2-3 Views Right  Result Date: 04/17/2016 CLINICAL DATA:  Right hip dislocation. EXAM: DG HIP  (WITH OR WITHOUT PELVIS) 2-3V RIGHT COMPARISON:  03/05/2016. FINDINGS: Lateral dislocation of the femoral head component of the right hip prosthesis with respect to the acetabular shell. No visible fracture identified. IMPRESSION: 1. Right prosthetic hip dislocation. Electronically Signed   By: Kerby Moors M.D.   On: 04/17/2016 18:20     Vitals Temp:  [98 F (36.7 C)] 98 F (36.7 C) (03/21 1541) Pulse Rate:  [66-76] 66 (03/21 2215) Resp:  [10-21] 11 (03/21 2215) BP: (100-128)/(57-94) 103/62 (03/21 2215) SpO2:  [92 %-100 %] 96 % (03/21 2215) Weight:  [95.7 kg (211 lb)] 95.7 kg (211 lb) (03/21 1541) Body mass index is 38.59 kg/m.  Physical Exam: RLE forshortened, pain with hip ROM, n/v intact distally     Assessment/Plan: Impression: Dislocated R THA Treatment: closed reduction with IV sedation in ED  Haniyah Maciolek M Marykathleen Russi 04/17/2016, 10:24 PM  Contact # [phone]

## 2016-04-17 NOTE — ED Provider Notes (Signed)
Emergency Department Provider Note   I have reviewed the triage vital signs and the nursing notes.   HISTORY  Chief Complaint Hip Pain   HPI [name] is a 61 y.o. female with PMH of GERD, HTN, COPD, Depression, OA, and frequent right hip dislocation despite revision of right THA on 03/02/2016 with Dr. Alvan Dame. The patient states that she was bending down to pick something up off the floor and suddenly felt severe pain in her right hip and knee. Patient states she immediately new that she dislocated the hip that she has done this many times before. She did have revision of the THA in February of this year and dislocated once since that revision. She saw Dr. Alvan Dame in the office last week with an overall good report. Patient denies any fall to the ground or head injury. She denies any numbness or tingling in the right leg. Pain is severe, sharp/heavy, and worse with movement. No alleviating factors.    Past Medical History:  Diagnosis Date  . Allergic rhinitis   . Anemia   . Anxiety   . Chronic back pain   . COPD (chronic obstructive pulmonary disease) ([place])   . Depression   . Essential hypertension   . GERD (gastroesophageal reflux disease)   . Headache    migraines  . History of gastritis    EGD 2015  . History of home oxygen therapy    2 liters at hs last 6 months  . Hypothyroidism   . Osteoarthritis    oa  . Scoliosis     Patient Active Problem List   Diagnosis Date Noted  . Instability of prosthetic hip ([place]) 03/01/2016  . Hip dislocation, right ([place]) 02/28/2016  . Dislocation of hip prosthesis ([place]) 02/17/2016  . Anterior dislocation of right hip ([place]) 01/19/2016  . S/P revision of total hip 01/01/2016  . Abnormal nuclear stress test: Intermediate Risk 10/31/2015  . Atypical angina ([place]) 10/31/2015  . Hip bursitis 08/20/2012  . Trigger finger, acquired 08/20/2012  . TOBACCO USER 12/12/2008  . DYSLIPIDEMIA 11/17/2008  . ANXIETY DISORDER 11/16/2008  .  HYPERTENSION, BENIGN 11/16/2008  . GEN OSTEOARTHROSIS INVOLVING MULTIPLE SITES 11/16/2008  . CHEST DISCOMFORT, ATYPICAL 11/16/2008    Past Surgical History:  Procedure Laterality Date  . APPENDECTOMY     1985  . CARDIAC CATHETERIZATION N/A 10/31/2015   Procedure: Left Heart Cath and Coronary Angiography;  Surgeon: Leonie Man, MD;  Location: [place] [facility];  Service: Cardiovascular;  Laterality: N/A;  . CARPAL TUNNEL RELEASE Left   . CARPAL TUNNEL RELEASE Right   . CHOLECYSTECTOMY  late 1980's  . HIP CLOSED REDUCTION Right 01/08/2016   Procedure: CLOSED MANIPULATION HIP;  Surgeon: Susa Day, MD;  [facility];  Service: Orthopedics;  Laterality: Right;  . HIP CLOSED REDUCTION Right 01/19/2016   Procedure: ATTEMPTED CLOSED REDUCTION RIGHT HIP;  Surgeon: Wylene Simmer, MD;  [facility];  Service: Orthopedics;  Laterality: Right;  . HIP CLOSED REDUCTION Right 01/20/2016   Procedure: CLOSED REDUCTION RIGHT TOTAL HIP;  Surgeon: Paralee Cancel, MD;  [facility];  Service: Orthopedics;  Laterality: Right;  . HIP CLOSED REDUCTION Right 02/17/2016   Procedure: CLOSED REDUCTION RIGHT TOTAL HIP;  Surgeon: Rod Can, MD;  Location: [place];  Service: Orthopedics;  Laterality: Right;  . HIP CLOSED REDUCTION Right 02/28/2016   Procedure: CLOSED REDUCTION HIP;  Surgeon: Nicholes Stairs, MD;  [facility];  Service: Orthopedics;  Laterality: Right;  .  TONSILLECTOMY    . TOTAL ABDOMINAL HYSTERECTOMY     1985, with 1 ovary removed and 2 nd ovary removed 2003  . TOTAL HIP ARTHROPLASTY Right    Original surgery 2006 with revision 2010  . TOTAL HIP REVISION Right 01/01/2016   Procedure: TOTAL HIP REVISION;  Surgeon: Paralee Cancel, MD;  [facility];  Service: Orthopedics;  Laterality: Right;  . TOTAL HIP REVISION Right 03/02/2016   Procedure: TOTAL HIP REVISION;  Surgeon: Paralee Cancel, MD;  [facility];  Service: Orthopedics;  Laterality: Right;  . ULNAR NERVE  TRANSPOSITION Right     Current Outpatient Rx  . Order #: [id number] Class: Historical Med  . Order #: [id number] Class: Historical Med  . Order #: [id number] Class: Historical Med  . Order #: [id number] Class: Historical Med  . Order #: [id number] Class: Historical Med  . Order #: [id number] Class: Historical Med  . Order #: [id number] Class: Historical Med  . Order #: [id number] Class: Print  . Order #: [id number] Class: Historical Med  . Order #: [id number] Class: Print  . Order #: [id number] Class: Historical Med  . Order #: [id number] Class: Historical Med  . Order #: [id number] Class: Historical Med  . Order #: [id number] Class: Historical Med  . Order #: [id number] Class: Historical Med  . Order #: [id number] Class: Historical Med  . Order #: [id number] Class: No Print  . Order #: [id number] Class: No Print  . Order #: [id number] Class: No Print  . Order #: [id number] Class: Print  . Order #: [id number] Class: No Print    Allergies Aleve [naproxen sodium]; Codeine; Penicillins; and Sulfonamide derivatives  Family History  Problem Relation Age of Onset  . COPD Mother   . Lung disease Father     Asbestosis  . Heart attack Father   . Cerebral aneurysm Brother     Social History Social History  Substance Use Topics  . Smoking status: Current Every Day Smoker    Packs/day: 0.50    Years: 40.00    Types: Cigarettes  . Smokeless tobacco: Never Used  . Alcohol use No    Review of Systems  Constitutional: No fever/chills Eyes: No visual changes. ENT: No sore throat. Cardiovascular: Denies chest pain. Respiratory: Denies shortness of breath. Gastrointestinal: No abdominal pain.  No nausea, no vomiting.  No diarrhea.  No constipation. Genitourinary: Negative for dysuria. Musculoskeletal: Negative for back pain. Positive right hip pain.  Skin: Negative for rash. Neurological: Negative for headaches, focal weakness or numbness.  10-point ROS otherwise  negative.  ____________________________________________   PHYSICAL EXAM:  VITAL SIGNS: ED Triage Vitals  Enc Vitals Group     BP 04/17/16 1541 128/73     Pulse Rate 04/17/16 1541 75     Resp 04/17/16 1541 18     Temp 04/17/16 1541 98 F (36.7 C)     Temp Source 04/17/16 1541 Oral     SpO2 04/17/16 1541 98 %     Weight 04/17/16 1541 211 lb (95.7 kg)     Height 04/17/16 1541 '5\' 2"'$  (1.575 m)     Pain Score 04/17/16 1552 10   Constitutional: Alert and oriented. Well appearing and in no acute distress. Eyes: Conjunctivae are normal. Head: Atraumatic. Nose: No congestion/rhinnorhea. Mouth/Throat: Mucous membranes are moist. Neck: No stridor.   Cardiovascular: Normal rate, regular rhythm. Good peripheral circulation. Grossly normal heart sounds.   Respiratory: Normal respiratory effort.  No retractions. Lungs CTAB. Gastrointestinal: Soft and nontender. No distention.  Musculoskeletal: No lower extremity edema. Positive severe tenderness to any motion of the right hip. Mild external rotation.  Neurologic:  Normal speech and language. No gross focal neurologic deficits are appreciated.  Skin:  Skin is warm, dry and intact. No rash noted. Psychiatric: Mood and affect are normal. Speech and behavior are normal.  ____________________________________________  RADIOLOGY  Dg Hip Port Unilat With Pelvis 1v Right  Result Date: 04/17/2016 CLINICAL DATA:  60 year old female post reduction of right hip dislocation. EXAM: DG HIP (WITH OR WITHOUT PELVIS) 1V PORT RIGHT COMPARISON:  Earlier radiograph dated 04/17/2016 FINDINGS: There has been interval reduction of the previously seen dislocated total right hip arthroplasty. The arthroplasty components appear intact and in anatomic alignment on this exam. The bones are osteopenic. There is no evidence of hardware loosening. No definite acute fracture identified. The soft tissues appear unremarkable. IMPRESSION: Successful interval reduction of the  previously seen dislocated right hip arthroplasty. Electronically Signed   By: Anner Crete M.D.   On: 04/17/2016 22:59   Dg Hip Unilat W Or Wo Pelvis 2-3 Views Right  Result Date: 04/17/2016 CLINICAL DATA:  Right hip dislocation. EXAM: DG HIP (WITH OR WITHOUT PELVIS) 2-3V RIGHT COMPARISON:  03/05/2016. FINDINGS: Lateral dislocation of the femoral head component of the right hip prosthesis with respect to the acetabular shell. No visible fracture identified. IMPRESSION: 1. Right prosthetic hip dislocation. Electronically Signed   By: Kerby Moors M.D.   On: 04/17/2016 18:20    ____________________________________________   PROCEDURES  Procedure(s) performed:   .Sedation  Date/Time: 04/18/2016 10:14 AM  Performed by: Margette Fast  Authorized by: Margette Fast   Consent:    Consent obtained:  Written   Consent given by:  Patient   Risks discussed:  Prolonged hypoxia resulting in organ damage, prolonged sedation necessitating reversal, respiratory compromise necessitating ventilatory assistance and intubation, vomiting, nausea, dysrhythmia, allergic reaction and inadequate sedation   Alternatives discussed:  Analgesia without sedation Indications:    Procedure performed:  Dislocation reduction   Procedure necessitating sedation performed by:  Physician performing sedation   Intended level of sedation:  Moderate (conscious sedation) Pre-sedation assessment:    ASA classification: class 2 - patient with mild systemic disease     Neck mobility: normal     Pre-sedation assessments completed and reviewed: airway patency, cardiovascular function, hydration status, mental status, nausea/vomiting and pain level     History of difficult intubation: no   Immediate pre-procedure details:    Reassessment: Patient reassessed immediately prior to procedure     Reviewed: vital signs, relevant labs/tests and NPO status     Verified: bag valve mask available, emergency equipment available,  intubation equipment available, IV patency confirmed and oxygen available   Procedure details (see MAR for exact dosages):    Sedation start time:  04/17/2016 7:35 PM   Preoxygenation:  Nasal cannula   Sedation:  Propofol   Intra-procedure monitoring:  Blood pressure monitoring, continuous capnometry, continuous pulse oximetry, cardiac monitor, frequent LOC assessments and frequent vital sign checks   Intra-procedure events: respiratory depression     Intra-procedure management:  Supplemental oxygen   Sedation end time:  04/17/2016 7:50 PM Post-procedure details:    Attendance: Constant attendance by certified staff until patient recovered     Recovery: Patient returned to pre-procedure baseline     Complications:  Mild hypoxia   Post-sedation assessments completed and reviewed: airway patency, cardiovascular function, hydration status, mental status, nausea/vomiting, pain level and respiratory function     Patient tolerance:  Tolerated well, no immediate complications .Splint Application  Date/Time: 04/18/2016 10:18 AM  Performed by:  LONG, JOSHUA G  Authorized by: Margette Fast   Consent:    Consent obtained:  Verbal   Consent given by:  Patient   Risks discussed:  Discoloration, numbness, pain and swelling   Alternatives discussed:  No treatment Pre-procedure details:    Sensation:  Normal Procedure details:    Laterality:  Right   Location:  Leg   Leg:  R upper leg   Strapping: yes     Cast type:  Long leg   Splint type:  Knee immobilizer Post-procedure details:    Pain:  Unchanged   Sensation:  Normal   Patient tolerance of procedure:  Tolerated well, no immediate complications ORTHOPEDIC INJURY TREATMENT Date/Time: 04/18/2016 10:19 AM Performed by: Margette Fast Authorized by: Margette Fast  Consent: Written consent obtained. Risks and benefits: risks, benefits and alternatives were discussed Consent given by: patient Patient understanding: patient states  understanding of the procedure being performed Patient consent: the patient's understanding of the procedure matches consent given Procedure consent: procedure consent matches procedure scheduled Relevant documents: relevant documents present and verified Test results: test results available and properly labeled Site marked: the operative site was marked Imaging studies: imaging studies available Required items: required blood products, implants, devices, and special equipment available Patient identity confirmed: verbally with patient and hospital-assigned identification number Time out: Immediately prior to procedure a "time out" was called to verify the correct patient, procedure, equipment, support staff and site/side marked as required. Injury location: hip Pre-procedure neurovascular assessment: neurovascularly intact Pre-procedure distal perfusion: normal Pre-procedure neurological function: normal Pre-procedure range of motion: reduced  Anesthesia: Local anesthesia used: no  Sedation: Patient sedated: yes Sedation type: moderate (conscious) sedation Sedatives: propofol Vitals: Vital signs were monitored during sedation. Post-procedure neurovascular assessment: post-procedure neurovascularly intact Post-procedure distal perfusion: normal Post-procedure neurological function: normal Post-procedure range of motion: unchanged Patient tolerance: Patient tolerated the procedure well with no immediate complications Comments: Attempted reduction of hip resulted in clinical relocation with immediate repeat dislocation of the hip. Procedure was abandoned after multiple repeat dislocations.   .Sedation Date/Time: 04/18/2016 10:22 AM Performed by: Margette Fast  Authorized by: Margette Fast   Consent:    Consent obtained:  Written   Consent given by:  Patient   Risks discussed:  Allergic reaction, dysrhythmia, inadequate sedation, nausea, vomiting, respiratory compromise  necessitating ventilatory assistance and intubation, prolonged sedation necessitating reversal and prolonged hypoxia resulting in organ damage Indications:    Procedure performed:  Dislocation reduction   Procedure necessitating sedation performed by:  Physician performing sedation (Sedation for hip reduction by orthopedic surgery)   Intended level of sedation:  Moderate (conscious sedation) Pre-sedation assessment:    Neck mobility: normal     Pre-sedation assessments completed and reviewed: airway patency, cardiovascular function, hydration status, mental status, nausea/vomiting and pain level     History of difficult intubation: no   Immediate pre-procedure details:    Reassessment: Patient reassessed immediately prior to procedure     Reviewed: vital signs     Verified: bag valve mask available, emergency equipment available, intubation equipment available, IV patency confirmed and oxygen available   Procedure details (see MAR for exact dosages):    Sedation start time:  04/17/2016 10:10 PM   Preoxygenation:  Nasal cannula and blow-by   Sedation:  Propofol   Intra-procedure monitoring:  Blood pressure monitoring, continuous capnometry, continuous pulse oximetry, cardiac monitor, frequent LOC assessments and frequent vital sign checks   Intra-procedure events: none  Intra-procedure management:  Supplemental oxygen   Sedation end time:  04/17/2016 10:25 AM   Total sedation time (minutes):  15 Post-procedure details:    Attendance: Constant attendance by certified staff until patient recovered     Recovery: Patient returned to pre-procedure baseline     Post-sedation assessments completed and reviewed: airway patency, cardiovascular function, hydration status, mental status, nausea/vomiting and pain level     Patient is stable for discharge or admission: yes     Patient tolerance:  Tolerated well, no immediate  complications     ____________________________________________   INITIAL IMPRESSION / ASSESSMENT AND PLAN / ED COURSE  Pertinent labs & imaging results that were available during my care of the patient were reviewed by me and considered in my medical decision making (see chart for details).  Patient resents to the emergency department for evaluation of right hip pain that feels similar to prior episodes or she is dislocated the hip. She had a THA revision in 02/2016 with Dr. Alvan Dame with [facility]. Patient has strongly palpable pulses in the right lower extremity. Normal sensation. Plan for plain films to confirm dislocation and attempt at bedside closed reduction under procedural sedation.   07:44 PM After sedation and attempted reduction the hip remains clinically dislocated. I was able to relocate the hip and then when laying the leg on the bed to apply the immobilizer the hip would dislocate again. Will discuss with orthopedics.  10:30 PM Repeat sedation provided for orthopedic provider. Was able to reduce hip and place in knee immobilizer. Patient tolerated second sedation well. No immediate complications. Appreciate orthopedic assistance. Patient will follow with Ortho as outpatient.   At this time, I do not feel there is any life-threatening condition present. I have reviewed and discussed all results (EKG, imaging, lab, urine as appropriate), exam findings with patient. I have reviewed nursing notes and appropriate previous records.  I feel the patient is safe to be discharged home without further emergent workup. Discussed usual and customary return precautions. Patient and family (if present) verbalize understanding and are comfortable with this plan.  Patient will follow-up with their primary care provider. If they do not have a primary care provider, information for follow-up has been provided to them. All questions have been  answered.  ____________________________________________  FINAL CLINICAL IMPRESSION(S) / ED DIAGNOSES  Final diagnoses:  Dislocation of right hip, subsequent encounter     MEDICATIONS GIVEN DURING THIS VISIT:  Medications  propofol (DIPRIVAN) 10 mg/mL bolus/IV push 95.7 mg (95.7 mg Intravenous Given 04/17/16 1938)  sodium chloride 0.9 % bolus 500 mL (0 mLs Intravenous Stopped 04/17/16 1926)  HYDROmorphone (DILAUDID) injection 1 mg (1 mg Intravenous Given 04/17/16 2104)  propofol (DIPRIVAN) 10 mg/mL bolus/IV push 95.7 mg (90 mg Intravenous Given 04/17/16 2210)     NEW OUTPATIENT MEDICATIONS STARTED DURING THIS VISIT:  None   Note:  This document was prepared using Dragon voice recognition software and may include unintentional dictation errors.  Nanda Quinton, MD Emergency Medicine  Margette Fast, MD 04/18/16 [phone]

## 2016-04-17 NOTE — Op Note (Signed)
   10:28 PM  PATIENT:   [name]  61 y.o. female  PRE-OPERATIVE DIAGNOSIS: dislocated R THA  POST-OPERATIVE DIAGNOSIS:  same  PROCEDURE:  Closed reduction  SURGEON:  Burnette Sautter, Metta Clines M.D.  ASSISTANTS: none   ANESTHESIA:   IV sedation  EBL: none  SPECIMEN:  none  Drains: none   PATIENT DISPOSITION:  stble in ED with d/c to home, follow total hip precautions    PLAN OF CARE: Discharge to home after PACU  Dictation# [id number]   Contact # [phone]

## 2016-04-17 NOTE — ED Notes (Signed)
Pt states she wears O2 at home when she sleeps and that 92-94% SpO2 is her baseline

## 2016-04-17 NOTE — Progress Notes (Signed)
Responded to bedside for standby on conscious sedation. During procedure, patient had some desaturations with airway obstruction and snoring. O2 increased from nasal canula to non-rebreather and airway opened using a head tilt method. Once awake, O2 rapidly weaned back to 2L nasal canula.

## 2016-04-17 NOTE — Discharge Instructions (Signed)
You were seen in the ED today with a  hip dislocation. We applied a knee immobilizer for stability. Call your orthopedic surgeon regarding the ED visit today.   Return to the ED with any new or worsening pain or if the hip dislocates again.

## 2016-04-18 NOTE — Op Note (Signed)
NAME:  KIMIE, PIDCOCK                    ACCOUNT NO.:  MEDICAL RECORD NO.:  [id number]  LOCATION:                                 FACILITY:  PHYSICIAN:  Metta Clines. Haralambos Yeatts, M.D.  DATE OF BIRTH:  [birth year]  DATE OF PROCEDURE:  04/17/2016 DATE OF DISCHARGE:                              OPERATIVE REPORT   PREOPERATIVE DIAGNOSIS:  Dislocated right total hip arthroplasty.  POSTOPERATIVE DIAGNOSIS:  Dislocated right total hip arthroplasty.  PROCEDURE:  Closed reduction of dislocated right total hip arthroplasty.  SURGEON:  Metta Clines. Creig Landin, M.D.  ASSISTANT:  None.  ANESTHESIA:  IV sedation with propofol.  HISTORY:  [name] is a 61 year old female with chronic difficulties related to her right hip.  Had an index procedure over 10 years ago. Apparently, had a revision surgery due to particulate metal debris with some pseudotumor about the hip, and more recently had a second revision in early December by Dr. Alvan Dame.  Postoperatively, she unfortunately developed a series of recurrent instability episodes requiring closed reductions and due to the frequency of her dislocations had a second revision recently at the beginning of February of this year.  She had an early postoperative dislocation, closed reduction, and had done well until earlier today when she was at home.  Bent over more than 90 degrees on the right hip, dislocated with immediate complaints of pain and inability to bear weight.  On presentation to the emergency room, found to have a foreshortened right lower extremity with severe pain and tenderness about the right hip.  Mobility x-rays confirmed a superior dislocation of the right hip.  An additional attempt at closed reduction was performed by the ED physicians, but unsuccessful in achieving a concentric reduction.  I was contacted and upon evaluation, the patient did indeed have a short right lower extremity, neurovascularly intact. We discussed an attempted repeat  closed reduction with propofol IV sedation.  Possible surgical complications were reviewed including periarticular fracture, recurrent dislocation, and possible need for additional surgery.  She understands and accepts, and agrees to the plan.  PROCEDURE IN DETAIL:  The patient was appropriately identified in the emergency room bed.  With appropriate monitoring, she received IV sedation from propofol and after achievement of adequate anesthesia, I performed a longitudinal traction reduction maneuver on the right hip regaining length to the extremity and then examination showed stable mobility, maintenance of appropriate leg length.  At this time, AP and oblique images of the right hip were obtained, showing concentric reduction of the hip.  She was placed in a knee immobilizer.  Anticipated discharge home.  Weightbearing as tolerated with knee immobilizer.  Strict adherence to a total hip precautions and she will otherwise maintain her followup appointment with Dr. Alvan Dame which is scheduled in approximately 3 weeks.     Metta Clines. Scottlynn Lindell, M.D.     KMS/MEDQ  D:  04/17/2016  T:  04/17/2016  Job:  [id number]

## 2016-05-03 ENCOUNTER — Emergency Department (HOSPITAL_COMMUNITY): Payer: BLUE CROSS/BLUE SHIELD

## 2016-05-03 ENCOUNTER — Emergency Department (HOSPITAL_COMMUNITY)
Admission: EM | Admit: 2016-05-03 | Discharge: 2016-05-03 | Disposition: A | Discharge status: Home / Self Care | Payer: BLUE CROSS/BLUE SHIELD | Attending: Emergency Medicine | Admitting: Emergency Medicine

## 2016-05-03 ENCOUNTER — Encounter (HOSPITAL_COMMUNITY): Payer: Self-pay | Admitting: Emergency Medicine

## 2016-05-03 DIAGNOSIS — M25559 Pain in unspecified hip: Secondary | ICD-10-CM

## 2016-05-03 DIAGNOSIS — X501XXA Overexertion from prolonged static or awkward postures, initial encounter: Secondary | ICD-10-CM | POA: Diagnosis not present

## 2016-05-03 DIAGNOSIS — Y9389 Activity, other specified: Secondary | ICD-10-CM | POA: Diagnosis not present

## 2016-05-03 DIAGNOSIS — Z79899 Other long term (current) drug therapy: Secondary | ICD-10-CM | POA: Diagnosis not present

## 2016-05-03 DIAGNOSIS — Y999 Unspecified external cause status: Secondary | ICD-10-CM | POA: Insufficient documentation

## 2016-05-03 DIAGNOSIS — F17210 Nicotine dependence, cigarettes, uncomplicated: Secondary | ICD-10-CM | POA: Insufficient documentation

## 2016-05-03 DIAGNOSIS — J449 Chronic obstructive pulmonary disease, unspecified: Secondary | ICD-10-CM | POA: Diagnosis not present

## 2016-05-03 DIAGNOSIS — S79911A Unspecified injury of right hip, initial encounter: Secondary | ICD-10-CM | POA: Diagnosis present

## 2016-05-03 DIAGNOSIS — E039 Hypothyroidism, unspecified: Secondary | ICD-10-CM | POA: Diagnosis not present

## 2016-05-03 DIAGNOSIS — S73004A Unspecified dislocation of right hip, initial encounter: Secondary | ICD-10-CM | POA: Diagnosis not present

## 2016-05-03 DIAGNOSIS — Y929 Unspecified place or not applicable: Secondary | ICD-10-CM | POA: Insufficient documentation

## 2016-05-03 DIAGNOSIS — I10 Essential (primary) hypertension: Secondary | ICD-10-CM | POA: Diagnosis not present

## 2016-05-03 MED ORDER — FENTANYL CITRATE (PF) 100 MCG/2ML IJ SOLN
INTRAMUSCULAR | Status: AC
  Filled 2016-05-03: qty 2

## 2016-05-03 MED ORDER — FENTANYL CITRATE (PF) 100 MCG/2ML IJ SOLN
100.0000 ug | Freq: Once | INTRAMUSCULAR | Status: AC
  Administered 2016-05-03: 100 ug via INTRAVENOUS

## 2016-05-03 MED ORDER — SODIUM CHLORIDE 0.9 % IV BOLUS (SEPSIS)
1000.0000 mL | Freq: Once | INTRAVENOUS | Status: AC
  Administered 2016-05-03: 1000 mL via INTRAVENOUS

## 2016-05-03 MED ORDER — FENTANYL CITRATE (PF) 100 MCG/2ML IJ SOLN
100.0000 ug | Freq: Once | INTRAMUSCULAR | Status: AC
  Administered 2016-05-03: 100 ug via INTRAVENOUS
  Filled 2016-05-03: qty 2

## 2016-05-03 MED ORDER — PROPOFOL 10 MG/ML IV BOLUS
100.0000 mg | Freq: Once | INTRAVENOUS | Status: AC
  Administered 2016-05-03: 50 mg via INTRAVENOUS
  Filled 2016-05-03: qty 20

## 2016-05-03 NOTE — ED Provider Notes (Signed)
[facility] DEPT Provider Note   CSN: [id number] Arrival date & time: 05/03/16  1554     History   Chief Complaint Chief Complaint  Patient presents with  . Hip Pain    HPI [name] is a 61 y.o. female.  HPI  61 year old female with a prosthetic right hip with multiple dislocations in the past presents with suspicion of her repeat this location. Patient states that she was bending over she felt a pop and she has severe pain in since that time. She is also has some right knee pain and some right elbow pain secondary to other injuries. States this feels like the previous dislocations. Did not fall or hurt anything else. No other pain elsewhere.  Past Medical History:  Diagnosis Date  . Allergic rhinitis   . Anemia   . Anxiety   . Chronic back pain   . COPD (chronic obstructive pulmonary disease) ([place])   . Depression   . Essential hypertension   . GERD (gastroesophageal reflux disease)   . Headache    migraines  . History of gastritis    EGD 2015  . History of home oxygen therapy    2 liters at hs last 6 months  . Hypothyroidism   . Osteoarthritis    oa  . Scoliosis     Patient Active Problem List   Diagnosis Date Noted  . Instability of prosthetic hip ([place]) 03/01/2016  . Hip dislocation, right ([place]) 02/28/2016  . Dislocation of hip prosthesis ([place]) 02/17/2016  . Anterior dislocation of right hip ([place]) 01/19/2016  . S/P revision of total hip 01/01/2016  . Abnormal nuclear stress test: Intermediate Risk 10/31/2015  . Atypical angina ([place]) 10/31/2015  . Hip bursitis 08/20/2012  . Trigger finger, acquired 08/20/2012  . TOBACCO USER 12/12/2008  . DYSLIPIDEMIA 11/17/2008  . ANXIETY DISORDER 11/16/2008  . HYPERTENSION, BENIGN 11/16/2008  . GEN OSTEOARTHROSIS INVOLVING MULTIPLE SITES 11/16/2008  . CHEST DISCOMFORT, ATYPICAL 11/16/2008    Past Surgical History:  Procedure Laterality Date  . APPENDECTOMY     1985  . CARDIAC CATHETERIZATION N/A 10/31/2015    Procedure: Left Heart Cath and Coronary Angiography;  Surgeon: Leonie Man, MD;  Location: [facility];  Service: Cardiovascular;  Laterality: N/A;  . CARPAL TUNNEL RELEASE Left   . CARPAL TUNNEL RELEASE Right   . CHOLECYSTECTOMY  late 1980's  . HIP CLOSED REDUCTION Right 01/08/2016   Procedure: CLOSED MANIPULATION HIP;  Surgeon: Susa Day, MD;  Location: [facility];  Service: Orthopedics;  Laterality: Right;  . HIP CLOSED REDUCTION Right 01/19/2016   Procedure: ATTEMPTED CLOSED REDUCTION RIGHT HIP;  Surgeon: Wylene Simmer, MD;  Location: [facility];  Service: Orthopedics;  Laterality: Right;  . HIP CLOSED REDUCTION Right 01/20/2016   Procedure: CLOSED REDUCTION RIGHT TOTAL HIP;  Surgeon: Paralee Cancel, MD;  Location: [facility];  Service: Orthopedics;  Laterality: Right;  . HIP CLOSED REDUCTION Right 02/17/2016   Procedure: CLOSED REDUCTION RIGHT TOTAL HIP;  Surgeon: Rod Can, MD;  Location: [facility];  Service: Orthopedics;  Laterality: Right;  . HIP CLOSED REDUCTION Right 02/28/2016   Procedure: CLOSED REDUCTION HIP;  Surgeon: Nicholes Stairs, MD;  Location: [facility];  Service: Orthopedics;  Laterality: Right;  . TONSILLECTOMY    . TOTAL ABDOMINAL HYSTERECTOMY     1985, with 1 ovary removed and 2 nd ovary removed 2003  . TOTAL HIP ARTHROPLASTY Right    Original surgery 2006 with revision 2010  . TOTAL HIP REVISION  Right 01/01/2016   Procedure: TOTAL HIP REVISION;  Surgeon: Paralee Cancel, MD;  Location: [facility];  Service: Orthopedics;  Laterality: Right;  . TOTAL HIP REVISION Right 03/02/2016   Procedure: TOTAL HIP REVISION;  Surgeon: Paralee Cancel, MD;  Location: [facility];  Service: Orthopedics;  Laterality: Right;  . ULNAR NERVE TRANSPOSITION Right     OB History    No data available       Home Medications    Prior to Admission medications   Medication Sig Start Date End Date Taking? Authorizing Provider  ALPRAZolam Duanne Moron) 1 MG tablet Take 1 mg by mouth 4 (four) times daily.  Takes 1 tab qam, 1 tab qpm, 2 tabs qhs   Yes Historical Provider, MD  Aspirin-Acetaminophen-Caffeine (GOODY HEADACHE PO) Take 1 packet by mouth daily as needed (pain).    Yes Historical Provider, MD  atorvastatin (LIPITOR) 20 MG tablet Take 20 mg by mouth at bedtime.    Yes Historical Provider, MD  fluticasone (FLONASE) 50 MCG/ACT nasal spray Place 2 sprays into both nostrils daily.   Yes Historical Provider, MD  HYDROcodone-acetaminophen (NORCO) 10-325 MG tablet Take 1-2 tablets by mouth every 4 (four) hours as needed for pain. 01/02/16  Yes Historical Provider, MD  levothyroxine (SYNTHROID, LEVOTHROID) 75 MCG tablet Take 75 mcg by mouth daily before breakfast.   Yes Historical Provider, MD  loratadine (CLARITIN) 10 MG tablet Take 10 mg by mouth daily.   Yes Historical Provider, MD  methocarbamol (ROBAXIN) 500 MG tablet Take 1 tablet (500 mg total) by mouth every 6 (six) hours as needed for muscle spasms. 03/02/16  Yes Danae Orleans, PA-C  omeprazole (PRILOSEC) 20 MG capsule Take 20 mg by mouth daily.   Yes Historical Provider, MD  ondansetron (ZOFRAN) 4 MG tablet Take 1 tablet (4 mg total) by mouth every 6 (six) hours as needed for nausea. 03/02/16  Yes Danae Orleans, PA-C  OXYGEN Inhale 2 L into the lungs at bedtime.    Yes Historical Provider, MD  potassium chloride (K-DUR) 10 MEQ tablet Take 10 mEq by mouth at bedtime.    Yes Historical Provider, MD  ranitidine (ZANTAC) 150 MG capsule Take 300 mg by mouth at bedtime.    Yes Historical Provider, MD  sertraline (ZOLOFT) 100 MG tablet Take 150 mg by mouth at bedtime.    Yes Historical Provider, MD  Tetrahydrozoline HCl (EYE DROPS OP) Apply 2 drops to eye daily as needed (dry eyes).    Yes Historical Provider, MD  triamterene-hydrochlorothiazide (DYAZIDE) 37.5-25 MG per capsule Take 1 capsule by mouth every morning.   Yes Historical Provider, MD    Family History Family History  Problem Relation Age of Onset  . COPD Mother   . Lung disease Father      Asbestosis  . Heart attack Father   . Cerebral aneurysm Brother     Social History Social History  Substance Use Topics  . Smoking status: Current Every Day Smoker    Packs/day: 0.50    Years: 40.00    Types: Cigarettes  . Smokeless tobacco: Never Used  . Alcohol use No     Allergies   Aleve [naproxen sodium]; Codeine; Penicillins; and Sulfonamide derivatives   Review of Systems Review of Systems  All other systems reviewed and are negative.    Physical Exam Updated Vital Signs BP (!) 113/55   Pulse 72   Temp 98.6 F (37 C) (Oral)   Resp 20   Ht '5\' 2"'$  (1.575 m)  Wt 211 lb (95.7 kg)   SpO2 97%   BMI 38.59 kg/m   Physical Exam  Constitutional: She appears well-developed and well-nourished.  HENT:  Head: Normocephalic and atraumatic.  Eyes: Conjunctivae and EOM are normal.  Neck: Normal range of motion.  Cardiovascular: Normal rate and regular rhythm.   Pulmonary/Chest: No stridor. No respiratory distress.  Abdominal: Soft. She exhibits no distension.  Musculoskeletal: She exhibits tenderness (with palpation of right elbow and ROM of right hip) and deformity (right leg shortened (but is on pillow)).  Neurological: She is alert.  Skin: Skin is warm and dry.  Nursing note and vitals reviewed.    ED Treatments / Results  Labs (all labs ordered are listed, but only abnormal results are displayed) Labs Reviewed - No data to display  EKG  EKG Interpretation None       Radiology Dg Elbow Complete Right  Result Date: 05/03/2016 CLINICAL DATA:  Lateral right elbow pain.  Previous elbow fracture. EXAM: RIGHT ELBOW - COMPLETE 3+ VIEW COMPARISON:  01/19/2016 FINDINGS: Evidence of patient's known radial head fracture with persistent lucency and displacement at the fracture site. Displaced anterior fat pad. Remainder of the exam is unchanged. IMPRESSION: Persistent lucency and displacement over patient's known radial head fracture. Displaced anterior fat  pad. Electronically Signed   By: Marin Olp M.D.   On: 05/03/2016 17:40   Dg Knee Complete 4 Views Right  Result Date: 05/03/2016 CLINICAL DATA:  Right knee pain after injury bending over. EXAM: RIGHT KNEE - COMPLETE 4+ VIEW COMPARISON:  02/01/2016 FINDINGS: Diffuse osteopenia. Mild osteoarthritic changes present. There is no acute fracture dislocation. No joint effusion. Diffuse osteopenia. IMPRESSION: No acute findings. Mild osteoarthritis. Electronically Signed   By: Marin Olp M.D.   On: 05/03/2016 17:41   Dg Hip Port Unilat W Or Wo Pelvis 1 View Right  Result Date: 05/03/2016 CLINICAL DATA:  Reduced right hip.  Initial encounter EXAM: DG HIP (WITH OR WITHOUT PELVIS) 1V PORT RIGHT COMPARISON:  Earlier today FINDINGS: Total right hip arthroplasty has been reduced in the AP projection. No visualized periprosthetic fracture. The tip of the femoral stem was not covered on this single view. IMPRESSION: Right hip arthroplasty has been reduced in the frontal projection. No new abnormality. Electronically Signed   By: Monte Fantasia M.D.   On: 05/03/2016 18:48   Dg Hip Unilat With Pelvis 2-3 Views Right  Result Date: 05/03/2016 CLINICAL DATA:  [place] right hip pop out of place when bending over with severe right hip pain and knee pain. EXAM: DG HIP (WITH OR WITHOUT PELVIS) 2-3V RIGHT COMPARISON:  04/17/2016 FINDINGS: Examination demonstrates evidence of a right total hip arthroplasty with superolateral dislocation of the femoral prosthesis. There are degenerative changes of the left hip. Degenerative change of the spine. Diffuse osteopenia is present. IMPRESSION: Right total hip arthroplasty with superolateral dislocation of the femoral prosthesis. Electronically Signed   By: Marin Olp M.D.   On: 05/03/2016 17:37    Procedures .Sedation Date/Time: 05/03/2016 10:17 PM Performed by: Merrily Pew Authorized by: Merrily Pew   Consent:    Consent obtained:  Written and verbal   Consent given by:   Patient   Risks discussed:  Allergic reaction, dysrhythmia, inadequate sedation, vomiting, nausea, respiratory compromise necessitating ventilatory assistance and intubation and prolonged hypoxia resulting in organ damage   Alternatives discussed:  Analgesia without sedation Indications:    Procedure performed:  Dislocation reduction   Procedure necessitating sedation performed by:  Physician performing sedation  Intended level of sedation:  Moderate (conscious sedation) Pre-sedation assessment:    Time since last food or drink:  >3 hours   ASA classification: class 2 - patient with mild systemic disease     Mouth opening:  3 or more finger widths   Thyromental distance:  3 finger widths   Mallampati score:  II - soft palate, uvula, fauces visible   Pre-sedation assessments completed and reviewed: airway patency, cardiovascular function, hydration status, mental status, nausea/vomiting and pain level     History of difficult intubation: no     Pre-sedation assessment completed:  05/03/2016 4:00 PM Procedure details (see MAR for exact dosages):    Sedation start time:  05/03/2016 5:00 PM   Preoxygenation:  Nasal cannula   Sedation:  Propofol   Analgesia:  Fentanyl   Intra-procedure monitoring:  Blood pressure monitoring, frequent LOC assessments, continuous pulse oximetry, continuous capnometry, cardiac monitor and frequent vital sign checks   Intra-procedure events: none     Sedation end time:  05/03/2016 6:43 PM   Total sedation time (minutes):  45 Post-procedure details:    Post-sedation assessment completed:  05/03/2016 7:30 PM   Attendance: Constant attendance by certified staff until patient recovered     Recovery: Patient returned to pre-procedure baseline     Estimated blood loss (see I/O flowsheets): no     Complications:  None   Post-sedation assessments completed and reviewed: airway patency, cardiovascular function, hydration status, mental status, nausea/vomiting, pain level,  respiratory function and temperature     Specimens recovered:  None   Patient is stable for discharge or admission: yes     Patient tolerance:  Tolerated well, no immediate complications ORTHOPEDIC INJURY TREATMENT Date/Time: 05/03/2016 10:20 PM Performed by: Merrily Pew Authorized by: Merrily Pew  Consent: Verbal consent obtained. Written consent obtained. Risks and benefits: risks, benefits and alternatives were discussed Consent given by: patient and spouse Patient understanding: patient states understanding of the procedure being performed Patient consent: the patient's understanding of the procedure matches consent given Procedure consent: procedure consent matches procedure scheduled Relevant documents: relevant documents present and verified Test results: test results available and properly labeled Site marked: the operative site was not marked Imaging studies: imaging studies available Required items: required blood products, implants, devices, and special equipment available Patient identity confirmed: verbally with patient, arm band, provided demographic data and hospital-assigned identification number Time out: Immediately prior to procedure a "time out" was called to verify the correct patient, procedure, equipment, support staff and site/side marked as required. Injury location: hip Location details: right hip Injury type: dislocation Dislocation type: posterior Spontaneous dislocation: yes Prosthesis: yes Pre-procedure neurovascular assessment: neurovascularly intact Pre-procedure distal perfusion: normal Pre-procedure neurological function: normal Pre-procedure range of motion: reduced  Anesthesia: Local anesthesia used: no  Sedation: Patient sedated: yes Sedatives: see MAR for details Analgesia: see MAR for details Manipulation performed: yes Reduction method: extension and traction and counter traction Reduction successful: yes X-ray confirmed reduction:  yes Immobilization: brace Post-procedure neurovascular assessment: post-procedure neurovascularly intact Post-procedure distal perfusion: normal Post-procedure neurological function: normal Post-procedure range of motion: normal Patient tolerance: Patient tolerated the procedure well with no immediate complications    (including critical care time)  Medications Ordered in ED Medications  fentaNYL (SUBLIMAZE) injection 100 mcg (100 mcg Intravenous Given 05/03/16 1643)  propofol (DIPRIVAN) 10 mg/mL bolus/IV push 100 mg (50 mg Intravenous Given by Other 05/03/16 1822)  sodium chloride 0.9 % bolus 1,000 mL (0 mLs Intravenous Stopped 05/03/16 1915)  fentaNYL (  SUBLIMAZE) injection 100 mcg (100 mcg Intravenous Given 05/03/16 1814)     Initial Impression / Assessment and Plan / ED Course  I have reviewed the triage vital signs and the nursing notes.  Pertinent labs & imaging results that were available during my care of the patient were reviewed by me and considered in my medical decision making (see chart for details).     Patient with successful hip reduction in ER. NVI afterwards. Discussed with Dr. Delfino Lovett on the phone and suggested knee immobilizer and follow up in 2 weeks. Patient wants to use knee immobilizer she has at home. Dc in stable condition.   Final Clinical Impressions(s) / ED Diagnoses   Final diagnoses:  Hip pain  Dislocation of right hip, initial encounter [facility])      Merrily Pew, MD 05/03/16 2222

## 2016-05-03 NOTE — Sedation Documentation (Signed)
Pt speaking to staff, alert and oriented, vitals WNL.

## 2016-05-03 NOTE — ED Notes (Addendum)
Consent signed by pt, nurse and MD for right hip reduction.  Pt understands risks and benefits of procedure.  Pt on 2L 02 Echo with capnography, pt also being cardiac monitored.

## 2016-05-03 NOTE — ED Triage Notes (Signed)
Per EMS: Pt bent over and had right hip pain following bend. Pt had hip replacement on this hip in 2005 and a revision in 2010. No shortening or rotation noted.  Pt also mentions that dog jumped up on her right elbow which is healing from previous fracture.  Pt alert and oriented, denies fall.

## 2016-05-17 DIAGNOSIS — S52111D Torus fracture of upper end of right radius, subsequent encounter for fracture with routine healing: Secondary | ICD-10-CM | POA: Diagnosis not present

## 2016-06-28 NOTE — Addendum Note (Signed)
Addendum  created 06/28/16 2482 by Effie Berkshire, MD   Sign clinical note

## 2016-06-29 NOTE — Addendum Note (Signed)
Addendum  created 06/29/16 0856 by Duane Boston, MD   Sign clinical note

## 2016-07-01 ENCOUNTER — Encounter (HOSPITAL_COMMUNITY): Payer: Self-pay | Admitting: Orthopedic Surgery

## 2016-07-01 NOTE — Addendum Note (Signed)
Addendum  created 07/01/16 1100 by Oleta Mouse, MD   Sign clinical note

## 2016-07-16 DIAGNOSIS — M545 Low back pain: Secondary | ICD-10-CM | POA: Diagnosis not present

## 2016-07-16 DIAGNOSIS — M5431 Sciatica, right side: Secondary | ICD-10-CM | POA: Diagnosis not present

## 2016-07-16 DIAGNOSIS — M5432 Sciatica, left side: Secondary | ICD-10-CM | POA: Diagnosis not present

## 2016-07-17 DIAGNOSIS — Z471 Aftercare following joint replacement surgery: Secondary | ICD-10-CM | POA: Diagnosis not present

## 2016-07-17 DIAGNOSIS — Z96641 Presence of right artificial hip joint: Secondary | ICD-10-CM | POA: Diagnosis not present

## 2016-07-17 DIAGNOSIS — M25562 Pain in left knee: Secondary | ICD-10-CM | POA: Diagnosis not present

## 2016-07-17 DIAGNOSIS — G8929 Other chronic pain: Secondary | ICD-10-CM | POA: Diagnosis not present

## 2016-07-23 ENCOUNTER — Emergency Department (HOSPITAL_COMMUNITY)
Admission: EM | Admit: 2016-07-23 | Discharge: 2016-07-23 | Disposition: A | Discharge status: Home / Self Care | Payer: BLUE CROSS/BLUE SHIELD | Attending: Emergency Medicine | Admitting: Emergency Medicine

## 2016-07-23 ENCOUNTER — Encounter (HOSPITAL_COMMUNITY): Payer: Self-pay

## 2016-07-23 ENCOUNTER — Emergency Department (HOSPITAL_COMMUNITY): Payer: BLUE CROSS/BLUE SHIELD

## 2016-07-23 DIAGNOSIS — Y929 Unspecified place or not applicable: Secondary | ICD-10-CM | POA: Diagnosis not present

## 2016-07-23 DIAGNOSIS — E039 Hypothyroidism, unspecified: Secondary | ICD-10-CM | POA: Diagnosis not present

## 2016-07-23 DIAGNOSIS — Z96649 Presence of unspecified artificial hip joint: Secondary | ICD-10-CM | POA: Diagnosis not present

## 2016-07-23 DIAGNOSIS — Z79899 Other long term (current) drug therapy: Secondary | ICD-10-CM | POA: Diagnosis not present

## 2016-07-23 DIAGNOSIS — M25551 Pain in right hip: Secondary | ICD-10-CM | POA: Diagnosis not present

## 2016-07-23 DIAGNOSIS — Y998 Other external cause status: Secondary | ICD-10-CM | POA: Insufficient documentation

## 2016-07-23 DIAGNOSIS — Y33XXXA Other specified events, undetermined intent, initial encounter: Secondary | ICD-10-CM | POA: Insufficient documentation

## 2016-07-23 DIAGNOSIS — J449 Chronic obstructive pulmonary disease, unspecified: Secondary | ICD-10-CM | POA: Insufficient documentation

## 2016-07-23 DIAGNOSIS — I10 Essential (primary) hypertension: Secondary | ICD-10-CM | POA: Diagnosis not present

## 2016-07-23 DIAGNOSIS — Z96641 Presence of right artificial hip joint: Secondary | ICD-10-CM | POA: Diagnosis not present

## 2016-07-23 DIAGNOSIS — T84020A Dislocation of internal right hip prosthesis, initial encounter: Secondary | ICD-10-CM | POA: Diagnosis not present

## 2016-07-23 DIAGNOSIS — Y829 Unspecified medical devices associated with adverse incidents: Secondary | ICD-10-CM | POA: Diagnosis not present

## 2016-07-23 DIAGNOSIS — Y939 Activity, unspecified: Secondary | ICD-10-CM | POA: Diagnosis not present

## 2016-07-23 DIAGNOSIS — R52 Pain, unspecified: Secondary | ICD-10-CM | POA: Diagnosis not present

## 2016-07-23 DIAGNOSIS — F17210 Nicotine dependence, cigarettes, uncomplicated: Secondary | ICD-10-CM | POA: Insufficient documentation

## 2016-07-23 DIAGNOSIS — T84021A Dislocation of internal left hip prosthesis, initial encounter: Secondary | ICD-10-CM | POA: Diagnosis not present

## 2016-07-23 DIAGNOSIS — M25552 Pain in left hip: Secondary | ICD-10-CM | POA: Diagnosis present

## 2016-07-23 DIAGNOSIS — Z9889 Other specified postprocedural states: Secondary | ICD-10-CM

## 2016-07-23 DIAGNOSIS — Z471 Aftercare following joint replacement surgery: Secondary | ICD-10-CM | POA: Diagnosis not present

## 2016-07-23 MED ORDER — PROPOFOL 10 MG/ML IV BOLUS
0.5000 mg/kg | Freq: Once | INTRAVENOUS | Status: AC
  Administered 2016-07-23: 50 mg via INTRAVENOUS
  Filled 2016-07-23: qty 20

## 2016-07-23 MED ORDER — FENTANYL CITRATE (PF) 100 MCG/2ML IJ SOLN
100.0000 ug | Freq: Once | INTRAMUSCULAR | Status: AC
  Administered 2016-07-23: 100 ug via INTRAVENOUS
  Filled 2016-07-23: qty 2

## 2016-07-23 MED ORDER — ONDANSETRON HCL 4 MG/2ML IJ SOLN
4.0000 mg | Freq: Once | INTRAMUSCULAR | Status: AC
  Administered 2016-07-23: 4 mg via INTRAVENOUS
  Filled 2016-07-23: qty 2

## 2016-07-23 MED ORDER — FENTANYL CITRATE (PF) 100 MCG/2ML IJ SOLN: 50.0000 ug | INTRAMUSCULAR | Status: DC | PRN

## 2016-07-23 MED ORDER — PROPOFOL 10 MG/ML IV BOLUS
INTRAVENOUS | Status: AC | PRN
  Administered 2016-07-23: 10 mg via INTRAVENOUS

## 2016-07-23 NOTE — ED Provider Notes (Signed)
[facility] DEPT Provider Note   CSN: [id number] Arrival date & time: 07/23/16  1543     History   Chief Complaint Chief Complaint  Patient presents with  . Hip Pain    HPI [name] is a 61 y.o. female.  The history is provided by the patient and medical records. No language interpreter was used.  Hip Pain  This is a recurrent problem. The current episode started 3 to 5 hours ago. The problem occurs constantly. The problem has not changed since onset.Pertinent negatives include no chest pain, no abdominal pain and no shortness of breath. The symptoms are aggravated by walking, standing and bending. Nothing relieves the symptoms. She has tried rest for the symptoms. The treatment provided no relief.    Past Medical History:  Diagnosis Date  . Allergic rhinitis   . Anemia   . Anxiety   . Chronic back pain   . COPD (chronic obstructive pulmonary disease) ([place])   . Depression   . Essential hypertension   . GERD (gastroesophageal reflux disease)   . Headache    migraines  . History of gastritis    EGD 2015  . History of home oxygen therapy    2 liters at hs last 6 months  . Hypothyroidism   . Osteoarthritis    oa  . Scoliosis     Patient Active Problem List   Diagnosis Date Noted  . Instability of prosthetic hip ([place]) 03/01/2016  . Hip dislocation, right ([place]) 02/28/2016  . Dislocation of hip prosthesis ([place]) 02/17/2016  . Anterior dislocation of right hip ([place]) 01/19/2016  . S/P revision of total hip 01/01/2016  . Abnormal nuclear stress test: Intermediate Risk 10/31/2015  . Atypical angina ([place]) 10/31/2015  . Hip bursitis 08/20/2012  . Trigger finger, acquired 08/20/2012  . TOBACCO USER 12/12/2008  . DYSLIPIDEMIA 11/17/2008  . ANXIETY DISORDER 11/16/2008  . HYPERTENSION, BENIGN 11/16/2008  . GEN OSTEOARTHROSIS INVOLVING MULTIPLE SITES 11/16/2008  . CHEST DISCOMFORT, ATYPICAL 11/16/2008    Past Surgical History:  Procedure Laterality Date  .  APPENDECTOMY     1985  . CARDIAC CATHETERIZATION N/A 10/31/2015   Procedure: Left Heart Cath and Coronary Angiography;  Surgeon: Leonie Man, MD;  Location: [facility];  Service: Cardiovascular;  Laterality: N/A;  . CARPAL TUNNEL RELEASE Left   . CARPAL TUNNEL RELEASE Right   . CHOLECYSTECTOMY  late 1980's  . HIP CLOSED REDUCTION Right 01/08/2016   Procedure: CLOSED MANIPULATION HIP;  Surgeon: Susa Day, MD;  Location: [facility];  Service: Orthopedics;  Laterality: Right;  . HIP CLOSED REDUCTION Right 01/19/2016   Procedure: ATTEMPTED CLOSED REDUCTION RIGHT HIP;  Surgeon: Wylene Simmer, MD;  Location: [facility];  Service: Orthopedics;  Laterality: Right;  . HIP CLOSED REDUCTION Right 01/20/2016   Procedure: CLOSED REDUCTION RIGHT TOTAL HIP;  Surgeon: Paralee Cancel, MD;  Location: [facility];  Service: Orthopedics;  Laterality: Right;  . HIP CLOSED REDUCTION Right 02/17/2016   Procedure: CLOSED REDUCTION RIGHT TOTAL HIP;  Surgeon: Rod Can, MD;  Location: [facility];  Service: Orthopedics;  Laterality: Right;  . HIP CLOSED REDUCTION Right 02/28/2016   Procedure: CLOSED REDUCTION HIP;  Surgeon: Nicholes Stairs, MD;  Location[facility];  Service: Orthopedics;  Laterality: Right;  . TONSILLECTOMY    . TOTAL ABDOMINAL HYSTERECTOMY     1985, with 1 ovary removed and 2 nd ovary removed 2003  . TOTAL HIP ARTHROPLASTY Right    Original surgery 2006 with revision  2010  . TOTAL HIP REVISION Right 01/01/2016   Procedure: TOTAL HIP REVISION;  Surgeon: Paralee Cancel, MD;  [facility];  Service: Orthopedics;  Laterality: Right;  . TOTAL HIP REVISION Right 03/02/2016   Procedure: TOTAL HIP REVISION;  Surgeon: Paralee Cancel, MD;  [facility];  Service: Orthopedics;  Laterality: Right;  . ULNAR NERVE TRANSPOSITION Right     OB History    No data available       Home Medications    Prior to Admission medications   Medication Sig Start Date End Date Taking? Authorizing Provider    ALPRAZolam Duanne Moron) 1 MG tablet Take 1 mg by mouth 4 (four) times daily. Takes 1 tab qam, 1 tab qpm, 2 tabs qhs    [provider]  Aspirin-Acetaminophen-Caffeine (GOODY HEADACHE PO) Take 1 packet by mouth daily as needed (pain).     [provider]  atorvastatin (LIPITOR) 20 MG tablet Take 20 mg by mouth at bedtime.     [provider]  fluticasone (FLONASE) 50 MCG/ACT nasal spray Place 2 sprays into both nostrils daily.    [provider]  HYDROcodone-acetaminophen (NORCO) 10-325 MG tablet Take 1-2 tablets by mouth every 4 (four) hours as needed for pain. 01/02/16   [provider]  levothyroxine (SYNTHROID, LEVOTHROID) 75 MCG tablet Take 75 mcg by mouth daily before breakfast.    [provider]  loratadine (CLARITIN) 10 MG tablet Take 10 mg by mouth daily.    [provider]  methocarbamol (ROBAXIN) 500 MG tablet Take 1 tablet (500 mg total) by mouth every 6 (six) hours as needed for muscle spasms. 03/02/16   Danae Orleans, PA-C  omeprazole (PRILOSEC) 20 MG capsule Take 20 mg by mouth daily.    [provider]  ondansetron (ZOFRAN) 4 MG tablet Take 1 tablet (4 mg total) by mouth every 6 (six) hours as needed for nausea. 03/02/16   Danae Orleans, PA-C  OXYGEN Inhale 2 L into the lungs at bedtime.     [provider]  potassium chloride (K-DUR) 10 MEQ tablet Take 10 mEq by mouth at bedtime.     [provider]  ranitidine (ZANTAC) 150 MG capsule Take 300 mg by mouth at bedtime.     [provider]  sertraline (ZOLOFT) 100 MG tablet Take 150 mg by mouth at bedtime.     [provider]  Tetrahydrozoline HCl (EYE DROPS OP) Apply 2 drops to eye daily as needed (dry eyes).     [provider]  triamterene-hydrochlorothiazide (DYAZIDE) 37.5-25 MG per capsule Take 1 capsule by mouth every morning.    [provider]    Family History Family History  Problem Relation Age of  Onset  . COPD Mother   . Lung disease Father        Asbestosis  . Heart attack Father   . Cerebral aneurysm Brother     Social History Social History  Substance Use Topics  . Smoking status: Current Every Day Smoker    Packs/day: 0.50    Years: 40.00    Types: Cigarettes  . Smokeless tobacco: Never Used  . Alcohol use No     Allergies   Aleve [naproxen sodium]; Codeine; Penicillins; and Sulfonamide derivatives   Review of Systems Review of Systems  Constitutional: Negative for chills and fever.  HENT: Negative for ear pain and sore throat.   Eyes: Negative for pain and visual disturbance.  Respiratory: Negative for cough and shortness of  breath.   Cardiovascular: Negative for chest pain and palpitations.  Gastrointestinal: Negative for abdominal pain and vomiting.  Genitourinary: Negative for dysuria and hematuria.  Musculoskeletal: Negative for arthralgias and back pain.       Positive for R hip pain  Skin: Negative for color change and rash.  Neurological: Negative for seizures and syncope.  All other systems reviewed and are negative.    Physical Exam Updated Vital Signs BP 115/80   Pulse 71   Temp 98.8 F (37.1 C) (Oral)   Resp 16   Wt 95.7 kg (211 lb)   SpO2 98%   BMI 38.59 kg/m   Physical Exam  Constitutional: She appears well-developed.  HENT:  Head: Normocephalic and atraumatic.  Eyes: Conjunctivae are normal.  Neck: Neck supple.  Cardiovascular: Normal rate and regular rhythm.   No murmur heard. Pulmonary/Chest: Effort normal and breath sounds normal. No respiratory distress.  Abdominal: Soft. There is no tenderness.  Musculoskeletal: She exhibits tenderness (R hip).  RLE appears shortened and externally rotated  Neurological: She is alert. No cranial nerve deficit. Coordination normal.  Moves all extremities. Intact distal sensation  Skin: Skin is warm and dry.  Nursing note and vitals reviewed.    ED Treatments / Results  Labs (all  labs ordered are listed, but only abnormal results are displayed) Labs Reviewed - No data to display  EKG  EKG Interpretation None       Radiology Dg Pelvis Portable  Result Date: 07/23/2016 CLINICAL DATA:  Popping sensation.  Pain . EXAM: PORTABLE PELVIS 1-2 VIEWS COMPARISON:  02/17/2016 . FINDINGS: Total right hip replacement with dislocation. No evidence of fracture. Degenerative changes lumbar spine left hip. Radiopacity noted over the right abdomen most likely pill fragment. IMPRESSION: Total right hip replacement with dislocation . Electronically Signed   By: [place]   On: 07/23/2016 16:39    Procedures ORTHOPEDIC INJURY TREATMENT Date/Time: 07/23/2016 5:36 PM Performed by: Payton Emerald Authorized by: Payton Emerald  Consent: Verbal consent obtained. Written consent obtained. Risks and benefits: risks, benefits and alternatives were discussed Consent given by: patient Patient understanding: patient states understanding of the procedure being performed Patient consent: the patient's understanding of the procedure matches consent given Procedure consent: procedure consent matches procedure scheduled Patient identity confirmed: arm band, hospital-assigned identification number and verbally with patient Time out: Immediately prior to procedure a "time out" was called to verify the correct patient, procedure, equipment, support staff and site/side marked as required. Injury location: hip Location details: right hip Injury type: dislocation Prosthesis: yes Pre-procedure distal perfusion: normal Pre-procedure neurological function: normal Pre-procedure range of motion: reduced  Sedation: Patient sedated: yes Sedation type: moderate (conscious) sedation Sedatives: see MAR for details Analgesia: see MAR for details Manipulation performed: yes Reduction successful: yes Immobilization: brace Post-procedure neurovascular assessment: post-procedure neurovascularly  intact Post-procedure distal perfusion: normal Post-procedure neurological function: normal Post-procedure range of motion: normal Patient tolerance: Patient tolerated the procedure well with no immediate complications    (including critical care time)  Medications Ordered in ED Medications  fentaNYL (SUBLIMAZE) injection 50 mcg (not administered)  propofol (DIPRIVAN) 10 mg/mL bolus/IV push 47.9 mg (50 mg Intravenous Given 07/23/16 1720)  fentaNYL (SUBLIMAZE) injection 100 mcg (100 mcg Intravenous Given 07/23/16 1720)  ondansetron (ZOFRAN) injection 4 mg (4 mg Intravenous Given 07/23/16 1721)  propofol (DIPRIVAN) 10 mg/mL bolus/IV push (10 mg Intravenous Given 07/23/16 1729)     Initial Impression / Assessment and Plan / ED Course  I have reviewed  the triage vital signs and the nursing notes.  Pertinent labs & imaging results that were available during my care of the patient were reviewed by me and considered in my medical decision making (see chart for details).     60 year old female history of recurrent right prosthetic hip dislocations who presents with concern for R THA dislocation.  Patient states she bent forward to connect some cables when she felt her right hip pop out of place.  She endorsed severe pain and fell forward.  She denies LOC.  Denies headache or nausea/vomiting.  Does not currently take anticoagulation.  Afebrile, VSS.  Right hip TTP.  RLE appears shortened and externally rotated.  Intact distal sensation and 2+ pulses.  X-ray showing dislocated right prosthetic hip.  Patient provided verbal and written consent for closed reduction of right prosthetic hip dislocation.  Propofol used for sedation.  Patient tolerated procedure well.  Post reduction x-rays showing relocated hip.  Spoke with [place] as patient follows with Dr. Alvan Dame.  Knee immobilizer applied and patient instructed on posterior hip precautions.  Return precautions provided for worsening  symptoms. Pt will f/u with PCP at first availability. Pt verbalized agreement with plan.  Pt care d/w Dr. Tyrone Nine  Final Clinical Impressions(s) / ED Diagnoses   Final diagnoses:  S/P closed reduction of dislocated total hip prosthesis  Dislocation of internal right hip prosthesis, initial encounter [facility])    New Prescriptions New Prescriptions   No medications on file     Payton Emerald, MD 07/24/16 [place], [place], DO 07/24/16 2317

## 2016-07-23 NOTE — Discharge Instructions (Signed)
Please follow hip precautions. Avoid flexing hip. Please followup with Dr. Alvan Dame

## 2016-07-23 NOTE — Progress Notes (Signed)
Orthopedic Tech Progress Note Patient Details:  [name] 1955-12-11 [id number]  Ortho Devices Type of Ortho Device: Knee Immobilizer Ortho Device/Splint Location: RLE Ortho Device/Splint Interventions: Ordered, Application   Braulio Bosch 07/23/2016, 6:14 PM

## 2016-07-23 NOTE — ED Triage Notes (Signed)
To room via EMS.  Pt was reaching over to get something out of drawer and felt "pop" and excruciating pain.  Right leg shortened and rotated.  Pt has h/o right hip dislocations and replacement.  EMS gave Fentanyl 100 mcg.

## 2016-08-26 NOTE — H&P (Signed)
TOTAL HIP REVISION ADMISSION H&P  Patient is admitted for right revision total hip arthroplasty, posteriorly (constrained liner).  Subjective:  Chief Complaint:       Failed right hip arthroplasty, instability  HPI: [name], 61 y.o. female, has a history of pain and functional disability in the right hip due to instability and patient has failed non-surgical conservative treatments for greater than 12 weeks to include NSAID's and/or analgesics, use of assistive devices and activity modification. The indications for the revision total hip arthroplasty are instability with multiple dislocations.  Onset of symptoms was gradual starting  years ago with gradually worsening course since that time.  Prior procedures on the right hip include arthroplasty.  Patient currently rates pain in the right hip at 6 out of 10 with activity.  There is no significant pain unless the hip is dislocated. Patient has evidence of previous THA by imaging studies.  This condition presents safety issues increasing the risk of falls.    There is no current active infection.  Risks, benefits and expectations were discussed with the patient.  Risks including but not limited to the risk of anesthesia, blood clots, nerve damage, blood vessel damage, failure of the prosthesis, infection and up to and including death.  Patient understand the risks, benefits and expectations and wishes to proceed with surgery.   PCP: Deloria Lair., MD  D/C Plans:       Home   Post-op Meds:       No Rx given  Tranexamic Acid:      To be given - IV   Decadron:      Is to be given  FYI:     ASA  Norco  DME:   Pt already has equipment   PT:   No PT   Patient Active Problem List   Diagnosis Date Noted  . Instability of prosthetic hip ([place]) 03/01/2016  . Hip dislocation, right ([place]) 02/28/2016  . Dislocation of hip prosthesis ([place]) 02/17/2016  . Anterior dislocation of right hip ([place]) 01/19/2016  . S/P revision of total hip  01/01/2016  . Abnormal nuclear stress test: Intermediate Risk 10/31/2015  . Atypical angina ([place]) 10/31/2015  . Hip bursitis 08/20/2012  . Trigger finger, acquired 08/20/2012  . TOBACCO USER 12/12/2008  . DYSLIPIDEMIA 11/17/2008  . ANXIETY DISORDER 11/16/2008  . HYPERTENSION, BENIGN 11/16/2008  . GEN OSTEOARTHROSIS INVOLVING MULTIPLE SITES 11/16/2008  . CHEST DISCOMFORT, ATYPICAL 11/16/2008   Past Medical History:  Diagnosis Date  . Allergic rhinitis   . Anemia   . Anxiety   . Chronic back pain   . COPD (chronic obstructive pulmonary disease) ([place])   . Depression   . Essential hypertension   . GERD (gastroesophageal reflux disease)   . Headache    migraines  . History of gastritis    EGD 2015  . History of home oxygen therapy    2 liters at hs last 6 months  . Hypothyroidism   . Osteoarthritis    oa  . Scoliosis     Past Surgical History:  Procedure Laterality Date  . APPENDECTOMY     1985  . CARDIAC CATHETERIZATION N/A 10/31/2015   Procedure: Left Heart Cath and Coronary Angiography;  Surgeon: Leonie Man, MD;  Location: [facility];  Service: Cardiovascular;  Laterality: N/A;  . CARPAL TUNNEL RELEASE Left   . CARPAL TUNNEL RELEASE Right   . CHOLECYSTECTOMY  late 1980's  . HIP CLOSED REDUCTION Right 01/08/2016  Procedure: CLOSED MANIPULATION HIP;  Surgeon: Susa Day, MD;  [facility];  Service: Orthopedics;  Laterality: Right;  . HIP CLOSED REDUCTION Right 01/19/2016   Procedure: ATTEMPTED CLOSED REDUCTION RIGHT HIP;  Surgeon: Wylene Simmer, MD;  [facility];  Service: Orthopedics;  Laterality: Right;  . HIP CLOSED REDUCTION Right 01/20/2016   Procedure: CLOSED REDUCTION RIGHT TOTAL HIP;  Surgeon: Paralee Cancel, MD;  [facility];  Service: Orthopedics;  Laterality: Right;  . HIP CLOSED REDUCTION Right 02/17/2016   Procedure: CLOSED REDUCTION RIGHT TOTAL HIP;  Surgeon: Rod Can, MD;  Location: [facility];  Service: Orthopedics;  Laterality:  Right;  . HIP CLOSED REDUCTION Right 02/28/2016   Procedure: CLOSED REDUCTION HIP;  Surgeon: Nicholes Stairs, MD;  [facility];  Service: Orthopedics;  Laterality: Right;  . TONSILLECTOMY    . TOTAL ABDOMINAL HYSTERECTOMY     1985, with 1 ovary removed and 2 nd ovary removed 2003  . TOTAL HIP ARTHROPLASTY Right    Original surgery 2006 with revision 2010  . TOTAL HIP REVISION Right 01/01/2016   Procedure: TOTAL HIP REVISION;  Surgeon: Paralee Cancel, MD;  [facility];  Service: Orthopedics;  Laterality: Right;  . TOTAL HIP REVISION Right 03/02/2016   Procedure: TOTAL HIP REVISION;  Surgeon: Paralee Cancel, MD;  [facility];  Service: Orthopedics;  Laterality: Right;  . ULNAR NERVE TRANSPOSITION Right     No prescriptions prior to admission.   Allergies  Allergen Reactions  . Aleve [Naproxen Sodium] Other (See Comments)    Headache   . Codeine     REACTION: GI upset  . Penicillins Other (See Comments)    GI upset Has patient had a PCN reaction causing immediate rash, facial/tongue/throat swelling, SOB or lightheadedness with hypotension: No Has patient had a PCN reaction causing severe rash involving mucus membranes or skin necrosis: No Has patient had a PCN reaction that required hospitalization No Has patient had a PCN reaction occurring within the last 10 years: No If all of the above answers are "NO", then may proceed with Cephalosporin use.   . Sulfonamide Derivatives Hives    Social History  Substance Use Topics  . Smoking status: Current Every Day Smoker    Packs/day: 0.50    Years: 40.00    Types: Cigarettes  . Smokeless tobacco: Never Used  . Alcohol use No    Family History  Problem Relation Age of Onset  . COPD Mother   . Lung disease Father        Asbestosis  . Heart attack Father   . Cerebral aneurysm Brother       Review of Systems  Constitutional: Negative.   HENT: Negative.   Eyes: Negative.   Respiratory: Negative.    Cardiovascular: Negative.   Gastrointestinal: Positive for heartburn.  Genitourinary: Negative.   Musculoskeletal: Positive for back pain and joint pain.  Skin: Negative.   Neurological: Positive for headaches.  Endo/Heme/Allergies: Positive for environmental allergies.  Psychiatric/Behavioral: Positive for depression. The patient is nervous/anxious.     Objective:  Physical Exam  Constitutional: She is oriented to [name], place, and time. She appears well-developed.  HENT:  Head: Normocephalic.  Eyes: Pupils are equal, round, and reactive to light.  Neck: Neck supple. No JVD present. No tracheal deviation present. No thyromegaly present.  Cardiovascular: Normal rate, regular rhythm and intact distal pulses.   Respiratory: Effort normal and breath sounds normal. No respiratory distress. She has no wheezes.  GI: Soft.  There is no tenderness. There is no guarding.  Musculoskeletal:       Right hip: She exhibits decreased range of motion, decreased strength, tenderness, bony tenderness and laceration (healed previous incision). She exhibits no swelling and no deformity.  Lymphadenopathy:    She has no cervical adenopathy.  Neurological: She is alert and oriented to [name], place, and time.  Skin: Skin is warm and dry.  Psychiatric: She has a normal mood and affect.      Labs:  Estimated body mass index is 38.59 kg/m as calculated from the following:   Height as of 05/03/16: 5\' 2"  (1.575 m).   Weight as of 07/23/16: 95.7 kg (211 lb).  Imaging Review:  Plain radiographs demonstrate previous THA of the right hip(s).  The bone quality appears to be good for age and reported activity level.  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  The patient history, physical examination, clinical judgement of the provider and imaging studies are consistent with instability of the right hip(s), previous total hip arthroplasty. Revision total hip arthroplasty is deemed medically  necessary. The treatment options including medical management, injection therapy, arthroscopy and arthroplasty were discussed at length. The risks and benefits of total hip arthroplasty were presented and reviewed. The risks due to aseptic loosening, infection, stiffness, dislocation/subluxation,  thromboembolic complications and other imponderables were discussed.  The patient acknowledged the explanation, agreed to proceed with the plan and consent was signed. Patient is being admitted for inpatient treatment for surgery, pain control, PT, OT, prophylactic antibiotics, VTE prophylaxis, progressive ambulation and ADL's and discharge planning. The patient is planning to be discharged home.      [place] Nutter   PA-C  08/26/2016, 2:08 PM

## 2016-08-28 ENCOUNTER — Other Ambulatory Visit (HOSPITAL_COMMUNITY): Payer: Self-pay | Admitting: Emergency Medicine

## 2016-08-28 NOTE — Patient Instructions (Signed)
[name]  08/28/2016   Your procedure is scheduled on: 09-02-16  Report to [facility] Main  Entrance    Follow signs to Short Stay on first floor at 515AM   Call this number if you have problems the morning of surgery  [phone]   Remember: ONLY 1 PERSON MAY GO WITH YOU TO SHORT STAY TO GET  READY MORNING OF [place].  Do not eat food or drink liquids :After Midnight.     Take these medicines the morning of surgery with A SIP OF WATER: levothyroxine(synthroid), loratadine(claritin) as needed, omeprazole(prilosec), eye drops                                You may not have any metal on your body including hair pins and              piercings  Do not wear jewelry, make-up, lotions, powders or perfumes, deodorant             Do not wear nail polish.  Do not shave  48 hours prior to surgery.                Do not bring valuables to the hospital. [place].  Contacts, dentures or bridgework may not be worn into surgery.  Leave suitcase in the car. After surgery it may be brought to your room.               Please read over the following fact sheets you were given: _____________________________________________________________________           [facility] - Preparing for Surgery Before surgery, you can play an important role.  Because skin is not sterile, your skin needs to be as free of germs as possible.  You can reduce the number of germs on your skin by washing with CHG (chlorahexidine gluconate) soap before surgery.  CHG is an antiseptic cleaner which kills germs and bonds with the skin to continue killing germs even after washing. Please DO NOT use if you have an allergy to CHG or antibacterial soaps.  If your skin becomes reddened/irritated stop using the CHG and inform your nurse when you arrive at Short Stay. Do not shave (including legs and underarms) for at least 48 hours prior to the first  CHG shower.  You may shave your face/neck. Please follow these instructions carefully:  1.  Shower with CHG Soap the night before surgery and the  morning of Surgery.  2.  If you choose to wash your hair, wash your hair first as usual with your  normal  shampoo.  3.  After you shampoo, rinse your hair and body thoroughly to remove the  shampoo.                           4.  Use CHG as you would any other liquid soap.  You can apply chg directly  to the skin and wash                       Gently with a scrungie or clean washcloth.  5.  Apply the CHG Soap to your body ONLY  FROM THE NECK DOWN.   Do not use on face/ open                           Wound or open sores. Avoid contact with eyes, ears mouth and genitals (private parts).                       Wash face,  Genitals (private parts) with your normal soap.             6.  Wash thoroughly, paying special attention to the area where your surgery  will be performed.  7.  Thoroughly rinse your body with warm water from the neck down.  8.  DO NOT shower/wash with your normal soap after using and rinsing off  the CHG Soap.                9.  Pat yourself dry with a clean towel.            10.  Wear clean pajamas.            11.  Place clean sheets on your bed the night of your first shower and do not  sleep with pets. Day of Surgery : Do not apply any lotions/deodorants the morning of surgery.  Please wear clean clothes to the hospital/surgery center.  FAILURE TO FOLLOW THESE INSTRUCTIONS MAY RESULT IN THE CANCELLATION OF YOUR SURGERY PATIENT SIGNATURE_________________________________  NURSE SIGNATURE__________________________________  ________________________________________________________________________   Adam Phenix  An incentive spirometer is a tool that can help keep your lungs clear and active. This tool measures how well you are filling your lungs with each breath. Taking long deep breaths may help reverse or decrease the  chance of developing breathing (pulmonary) problems (especially infection) following:  A long period of time when you are unable to move or be active. BEFORE THE PROCEDURE   If the spirometer includes an indicator to show your best effort, your nurse or respiratory therapist will set it to a desired goal.  If possible, sit up straight or lean slightly forward. Try not to slouch.  Hold the incentive spirometer in an upright position. INSTRUCTIONS FOR USE  1. Sit on the edge of your bed if possible, or sit up as far as you can in bed or on a chair. 2. Hold the incentive spirometer in an upright position. 3. Breathe out normally. 4. Place the mouthpiece in your mouth and seal your lips tightly around it. 5. Breathe in slowly and as deeply as possible, raising the piston or the ball toward the top of the column. 6. Hold your breath for 3-5 seconds or for as long as possible. Allow the piston or ball to fall to the bottom of the column. 7. Remove the mouthpiece from your mouth and breathe out normally. 8. Rest for a few seconds and repeat Steps 1 through 7 at least 10 times every 1-2 hours when you are awake. Take your time and take a few normal breaths between deep breaths. 9. The spirometer may include an indicator to show your best effort. Use the indicator as a goal to work toward during each repetition. 10. After each set of 10 deep breaths, practice coughing to be sure your lungs are clear. If you have an incision (the cut made at the time of surgery), support your incision when coughing by placing a pillow or rolled up towels firmly against it. Once you  are able to get out of bed, walk around indoors and cough well. You may stop using the incentive spirometer when instructed by your caregiver.  RISKS AND COMPLICATIONS  Take your time so you do not get dizzy or light-headed.  If you are in pain, you may need to take or ask for pain medication before doing incentive spirometry. It is harder  to take a deep breath if you are having pain. AFTER USE  Rest and breathe slowly and easily.  It can be helpful to keep track of a log of your progress. Your caregiver can provide you with a simple table to help with this. If you are using the spirometer at home, follow these instructions: [place] IF:   You are having difficultly using the spirometer.  You have trouble using the spirometer as often as instructed.  Your pain medication is not giving enough relief while using the spirometer.  You develop fever of 100.5 F (38.1 C) or higher. SEEK IMMEDIATE MEDICAL CARE IF:   You cough up bloody sputum that had not been present before.  You develop fever of 102 F (38.9 C) or greater.  You develop worsening pain at or near the incision site. MAKE SURE YOU:   Understand these instructions.  Will watch your condition.  Will get help right away if you are not doing well or get worse. Document Released: 05/27/2006 Document Revised: 04/08/2011 Document Reviewed: 07/28/2006 Exit[employer] Patient Information 2014 ExitCare, [place].   ________________________________________________________________________  WHAT IS A BLOOD TRANSFUSION? Blood Transfusion Information  A transfusion is the replacement of blood or some of its parts. Blood is made up of multiple cells which provide different functions.  Red blood cells carry oxygen and are used for blood loss replacement.  White blood cells fight against infection.  Platelets control bleeding.  Plasma helps clot blood.  Other blood products are available for specialized needs, such as hemophilia or other clotting disorders. BEFORE THE TRANSFUSION  Who gives blood for transfusions?   Healthy volunteers who are fully evaluated to make sure their blood is safe. This is blood bank blood. Transfusion therapy is the safest it has ever been in the practice of medicine. Before blood is taken from a donor, a complete history is taken  to make sure that person has no history of diseases nor engages in risky social behavior (examples are intravenous drug use or sexual activity with multiple partners). The donor's travel history is screened to minimize risk of transmitting infections, such as malaria. The donated blood is tested for signs of infectious diseases, such as HIV and hepatitis. The blood is then tested to be sure it is compatible with you in order to minimize the chance of a transfusion reaction. If you or a relative donates blood, this is often done in anticipation of surgery and is not appropriate for emergency situations. It takes many days to process the donated blood. RISKS AND COMPLICATIONS Although transfusion therapy is very safe and saves many lives, the main dangers of transfusion include:   Getting an infectious disease.  Developing a transfusion reaction. This is an allergic reaction to something in the blood you were given. Every precaution is taken to prevent this. The decision to have a blood transfusion has been considered carefully by your caregiver before blood is given. Blood is not given unless the benefits outweigh the risks. AFTER THE TRANSFUSION  Right after receiving a blood transfusion, you will usually feel much better and more energetic. This is  especially true if your red blood cells have gotten low (anemic). The transfusion raises the level of the red blood cells which carry oxygen, and this usually causes an energy increase.  The nurse administering the transfusion will monitor you carefully for complications. HOME CARE INSTRUCTIONS  No special instructions are needed after a transfusion. You may find your energy is better. Speak with your caregiver about any limitations on activity for underlying diseases you may have. SEEK MEDICAL CARE IF:   Your condition is not improving after your transfusion.  You develop redness or irritation at the intravenous (IV) site. SEEK IMMEDIATE MEDICAL CARE  IF:  Any of the following symptoms occur over the next 12 hours:  Shaking chills.  You have a temperature by mouth above 102 F (38.9 C), not controlled by medicine.  Chest, back, or muscle pain.  People around you feel you are not acting correctly or are confused.  Shortness of breath or difficulty breathing.  Dizziness and fainting.  You get a rash or develop hives.  You have a decrease in urine output.  Your urine turns a dark color or changes to pink, red, or brown. Any of the following symptoms occur over the next 10 days:  You have a temperature by mouth above 102 F (38.9 C), not controlled by medicine.  Shortness of breath.  Weakness after normal activity.  The white part of the eye turns yellow (jaundice).  You have a decrease in the amount of urine or are urinating less often.  Your urine turns a dark color or changes to pink, red, or brown. Document Released: 01/12/2000 Document Revised: 04/08/2011 Document Reviewed: 08/31/2007 [facility] Patient Information 2014 [place], [place].  _______________________________________________________________________

## 2016-08-28 NOTE — Progress Notes (Signed)
EKG 02-28-16 epic  Stress Test 09-2015 ; per cardiology note on 10-20-15 Dr Domenic Polite  "Results reviewed. I reviewed the images as well. Stress test was abnormal suggesting the possibility of inferior wall ischemia (RCA distribution) although there does appear to be a component of artifact as well. Would recommend that she be seen next week on APP schedule to discuss possibility of diagnostic cardiac catheterization to clarify in light of recurring symptoms  Cardiac catherization 10-31-15 epic ; interventional cardiologist Dr Ellyn Hack conslusion reads  "       The left ventricular systolic function is normal.  LV end diastolic pressure is normal.  There is trivial (1+) mitral regurgitation.  There is no aortic valve stenosis.  Angiographically normal coronary arteries    Angiographically no significant coronary disease with relatively minimal inferior wall perfusion with a small caliber PDA and no significant posterior lateral branching. However there is no lesion to explain the patient's inferior ischemia.   Consider non-anginal cause for the patient's chest pain. False-positive stress test"  CXR 02-28-16 epic

## 2016-08-29 ENCOUNTER — Encounter (HOSPITAL_COMMUNITY): Payer: Self-pay

## 2016-08-29 ENCOUNTER — Encounter (HOSPITAL_COMMUNITY)
Admission: RE | Admit: 2016-08-29 | Discharge: 2016-08-29 | Disposition: A | Discharge status: Home / Self Care | Payer: BLUE CROSS/BLUE SHIELD | Source: Ambulatory Visit | Attending: Orthopedic Surgery | Admitting: Orthopedic Surgery

## 2016-08-29 DIAGNOSIS — Z01812 Encounter for preprocedural laboratory examination: Secondary | ICD-10-CM | POA: Insufficient documentation

## 2016-08-29 DIAGNOSIS — T84092A Other mechanical complication of internal right knee prosthesis, initial encounter: Secondary | ICD-10-CM | POA: Diagnosis not present

## 2016-08-29 LAB — CBC
HEMATOCRIT: 34.1 % — AB (ref 36.0–46.0)
Hemoglobin: 10.2 g/dL — ABNORMAL LOW (ref 12.0–15.0)
MCH: 24.6 pg — ABNORMAL LOW (ref 26.0–34.0)
MCHC: 29.9 g/dL — AB (ref 30.0–36.0)
MCV: 82.2 fL (ref 78.0–100.0)
Platelets: 209 10*3/uL (ref 150–400)
RBC: 4.15 MIL/uL (ref 3.87–5.11)
RDW: 18.9 % — AB (ref 11.5–15.5)
WBC: 6.6 10*3/uL (ref 4.0–10.5)

## 2016-08-29 LAB — BASIC METABOLIC PANEL
Anion gap: 6 (ref 5–15)
BUN: 13 mg/dL (ref 6–20)
CO2: 29 mmol/L (ref 22–32)
CREATININE: 0.96 mg/dL (ref 0.44–1.00)
Calcium: 8.5 mg/dL — ABNORMAL LOW (ref 8.9–10.3)
Chloride: 105 mmol/L (ref 101–111)
GFR calc non Af Amer: >60 mL/min (ref >60)
Glucose, Bld: 90 mg/dL (ref 65–99)
Potassium: 3.7 mmol/L (ref 3.5–5.1)
SODIUM: 140 mmol/L (ref 135–145)

## 2016-08-29 LAB — SURGICAL PCR SCREEN
MRSA, PCR: NEGATIVE
STAPHYLOCOCCUS AUREUS: NEGATIVE

## 2016-09-01 NOTE — Anesthesia Preprocedure Evaluation (Addendum)
Anesthesia Evaluation  Patient identified by MRN, date of birth, ID band Patient awake    Reviewed: Allergy & Precautions, NPO status , Patient's Chart, lab work & pertinent test results  History of Anesthesia Complications Negative for: history of anesthetic complications  Airway Mallampati: I  TM Distance: >3 FB Neck ROM: Full    Dental  (+) Missing,    Pulmonary COPD, Current Smoker,    breath sounds clear to auscultation       Cardiovascular hypertension, Pt. on medications (-) Past MI and (-) CHF (-) dysrhythmias  Rhythm:Regular     Neuro/Psych  Headaches, PSYCHIATRIC DISORDERS Anxiety Depression    GI/Hepatic Neg liver ROS, GERD  Medicated and Controlled,  Endo/Other  Hypothyroidism Morbid obesity  Renal/GU negative Renal ROS     Musculoskeletal  (+) Arthritis ,   Abdominal   Peds  Hematology  (+) anemia ,   Anesthesia Other Findings CATH 10/17 The left ventricular systolic function is normal.  LV end diastolic pressure is normal.  There is trivial (1+) mitral regurgitation.  There is no aortic valve stenosis.  Angiographically normal coronary arteries    Reproductive/Obstetrics                           Anesthesia Physical  Anesthesia Plan  ASA: III  Anesthesia Plan: Spinal   Post-op Pain Management:    Induction:   PONV Risk Score and Plan: 2 and Ondansetron, Dexamethasone, Midazolam and Treatment may vary due to age or medical condition  Airway Management Planned: Natural Airway, Nasal Cannula and Simple Face Mask  Additional Equipment: None  Intra-op Plan:   Post-operative Plan:   Informed Consent: I have reviewed the patients History and Physical, chart, labs and discussed the procedure including the risks, benefits and alternatives for the proposed anesthesia with the patient or authorized representative who has indicated his/her understanding and  acceptance.   Dental advisory given  Plan Discussed with: CRNA and Surgeon  Anesthesia Plan Comments:         Anesthesia Quick Evaluation

## 2016-09-02 ENCOUNTER — Encounter (HOSPITAL_COMMUNITY)
Admission: RE | Disposition: A | Discharge status: Home / Self Care | Payer: Self-pay | Source: Ambulatory Visit | Attending: Orthopedic Surgery

## 2016-09-02 ENCOUNTER — Inpatient Hospital Stay (HOSPITAL_COMMUNITY)
Admission: RE | Admit: 2016-09-02 | Discharge: 2016-09-03 | DRG: 468 | Disposition: A | Discharge status: Home / Self Care | Payer: BLUE CROSS/BLUE SHIELD | Source: Ambulatory Visit | Attending: Orthopedic Surgery | Admitting: Orthopedic Surgery

## 2016-09-02 ENCOUNTER — Encounter (HOSPITAL_COMMUNITY): Payer: Self-pay | Admitting: *Deleted

## 2016-09-02 ENCOUNTER — Inpatient Hospital Stay (HOSPITAL_COMMUNITY): Payer: BLUE CROSS/BLUE SHIELD | Admitting: Anesthesiology

## 2016-09-02 DIAGNOSIS — E039 Hypothyroidism, unspecified: Secondary | ICD-10-CM | POA: Diagnosis present

## 2016-09-02 DIAGNOSIS — Z7951 Long term (current) use of inhaled steroids: Secondary | ICD-10-CM

## 2016-09-02 DIAGNOSIS — Z882 Allergy status to sulfonamides status: Secondary | ICD-10-CM

## 2016-09-02 DIAGNOSIS — M549 Dorsalgia, unspecified: Secondary | ICD-10-CM | POA: Diagnosis present

## 2016-09-02 DIAGNOSIS — Z90710 Acquired absence of both cervix and uterus: Secondary | ICD-10-CM

## 2016-09-02 DIAGNOSIS — Z79891 Long term (current) use of opiate analgesic: Secondary | ICD-10-CM

## 2016-09-02 DIAGNOSIS — M1990 Unspecified osteoarthritis, unspecified site: Secondary | ICD-10-CM | POA: Diagnosis present

## 2016-09-02 DIAGNOSIS — F419 Anxiety disorder, unspecified: Secondary | ICD-10-CM | POA: Diagnosis present

## 2016-09-02 DIAGNOSIS — Z9981 Dependence on supplemental oxygen: Secondary | ICD-10-CM

## 2016-09-02 DIAGNOSIS — I10 Essential (primary) hypertension: Secondary | ICD-10-CM | POA: Diagnosis present

## 2016-09-02 DIAGNOSIS — J309 Allergic rhinitis, unspecified: Secondary | ICD-10-CM | POA: Diagnosis present

## 2016-09-02 DIAGNOSIS — F17210 Nicotine dependence, cigarettes, uncomplicated: Secondary | ICD-10-CM | POA: Diagnosis present

## 2016-09-02 DIAGNOSIS — G8929 Other chronic pain: Secondary | ICD-10-CM | POA: Diagnosis present

## 2016-09-02 DIAGNOSIS — K219 Gastro-esophageal reflux disease without esophagitis: Secondary | ICD-10-CM | POA: Diagnosis present

## 2016-09-02 DIAGNOSIS — Z9049 Acquired absence of other specified parts of digestive tract: Secondary | ICD-10-CM

## 2016-09-02 DIAGNOSIS — E785 Hyperlipidemia, unspecified: Secondary | ICD-10-CM | POA: Diagnosis present

## 2016-09-02 DIAGNOSIS — M419 Scoliosis, unspecified: Secondary | ICD-10-CM | POA: Diagnosis present

## 2016-09-02 DIAGNOSIS — Z791 Long term (current) use of non-steroidal anti-inflammatories (NSAID): Secondary | ICD-10-CM

## 2016-09-02 DIAGNOSIS — F329 Major depressive disorder, single episode, unspecified: Secondary | ICD-10-CM | POA: Diagnosis present

## 2016-09-02 DIAGNOSIS — Z886 Allergy status to analgesic agent status: Secondary | ICD-10-CM | POA: Diagnosis not present

## 2016-09-02 DIAGNOSIS — Z96641 Presence of right artificial hip joint: Secondary | ICD-10-CM | POA: Diagnosis not present

## 2016-09-02 DIAGNOSIS — Z6839 Body mass index (BMI) 39.0-39.9, adult: Secondary | ICD-10-CM

## 2016-09-02 DIAGNOSIS — Z880 Allergy status to penicillin: Secondary | ICD-10-CM | POA: Diagnosis not present

## 2016-09-02 DIAGNOSIS — Z885 Allergy status to narcotic agent status: Secondary | ICD-10-CM

## 2016-09-02 DIAGNOSIS — T84020A Dislocation of internal right hip prosthesis, initial encounter: Principal | ICD-10-CM | POA: Diagnosis present

## 2016-09-02 DIAGNOSIS — Z8249 Family history of ischemic heart disease and other diseases of the circulatory system: Secondary | ICD-10-CM | POA: Diagnosis not present

## 2016-09-02 DIAGNOSIS — J449 Chronic obstructive pulmonary disease, unspecified: Secondary | ICD-10-CM | POA: Diagnosis present

## 2016-09-02 DIAGNOSIS — Z825 Family history of asthma and other chronic lower respiratory diseases: Secondary | ICD-10-CM

## 2016-09-02 DIAGNOSIS — G43909 Migraine, unspecified, not intractable, without status migrainosus: Secondary | ICD-10-CM | POA: Diagnosis present

## 2016-09-02 DIAGNOSIS — Z96649 Presence of unspecified artificial hip joint: Secondary | ICD-10-CM

## 2016-09-02 HISTORY — PX: TOTAL HIP REVISION: SHX763

## 2016-09-02 LAB — TYPE AND SCREEN
ABO/RH(D): O NEG
Antibody Screen: NEGATIVE

## 2016-09-02 SURGERY — TOTAL HIP REVISION
Anesthesia: Spinal | Site: Hip | Laterality: Right

## 2016-09-02 MED ORDER — FERROUS SULFATE 325 (65 FE) MG PO TABS: 325.0000 mg | ORAL_TABLET | Freq: Three times a day (TID) | ORAL | Status: DC

## 2016-09-02 MED ORDER — SODIUM CHLORIDE 0.9 % IR SOLN
Status: DC | PRN
  Administered 2016-09-02: 1000 mL

## 2016-09-02 MED ORDER — ONDANSETRON HCL 4 MG/2ML IJ SOLN
INTRAMUSCULAR | Status: DC | PRN
Start: 2016-09-02 — End: 2016-09-02
  Administered 2016-09-02: 4 mg via INTRAVENOUS

## 2016-09-02 MED ORDER — PHENOL 1.4 % MT LIQD: 1.0000 | OROMUCOSAL | Status: DC | PRN

## 2016-09-02 MED ORDER — METHOCARBAMOL 1000 MG/10ML IJ SOLN
500.0000 mg | Freq: Four times a day (QID) | INTRAVENOUS | Status: DC | PRN
  Administered 2016-09-02: 500 mg via INTRAVENOUS
  Filled 2016-09-02: qty 550

## 2016-09-02 MED ORDER — ALPRAZOLAM 1 MG PO TABS
1.0000 mg | ORAL_TABLET | Freq: Every day | ORAL | Status: DC
  Administered 2016-09-03: 1 mg via ORAL
  Filled 2016-09-02: qty 1

## 2016-09-02 MED ORDER — METOCLOPRAMIDE HCL 5 MG PO TABS: 5.0000 mg | ORAL_TABLET | Freq: Three times a day (TID) | ORAL | Status: DC | PRN

## 2016-09-02 MED ORDER — LACTATED RINGERS IV SOLN
INTRAVENOUS | Status: DC
  Administered 2016-09-02 (×3): via INTRAVENOUS

## 2016-09-02 MED ORDER — ATORVASTATIN CALCIUM 20 MG PO TABS
20.0000 mg | ORAL_TABLET | Freq: Every day | ORAL | Status: DC
  Administered 2016-09-02: 20 mg via ORAL
  Filled 2016-09-02: qty 1

## 2016-09-02 MED ORDER — MAGNESIUM CITRATE PO SOLN: 1.0000 | Freq: Once | ORAL | Status: DC | PRN

## 2016-09-02 MED ORDER — FENTANYL CITRATE (PF) 100 MCG/2ML IJ SOLN: 25.0000 ug | INTRAMUSCULAR | Status: DC | PRN

## 2016-09-02 MED ORDER — POLYETHYLENE GLYCOL 3350 17 G PO PACK: 17.0000 g | PACK | Freq: Two times a day (BID) | ORAL | 0 refills | Status: DC

## 2016-09-02 MED ORDER — ONDANSETRON HCL 4 MG/2ML IJ SOLN: 4.0000 mg | Freq: Once | INTRAMUSCULAR | Status: DC | PRN

## 2016-09-02 MED ORDER — PHENYLEPHRINE HCL 10 MG/ML IJ SOLN
INTRAMUSCULAR | Status: DC | PRN
  Administered 2016-09-02: 40 ug via INTRAVENOUS
  Administered 2016-09-02: 80 ug via INTRAVENOUS
  Administered 2016-09-02 (×3): 40 ug via INTRAVENOUS
  Administered 2016-09-02 (×3): 80 ug via INTRAVENOUS

## 2016-09-02 MED ORDER — TRANEXAMIC ACID 1000 MG/10ML IV SOLN
1000.0000 mg | Freq: Once | INTRAVENOUS | Status: AC
  Administered 2016-09-02: 14:00:00 1000 mg via INTRAVENOUS
  Filled 2016-09-02: qty 10
  Filled 2016-09-02: qty 1100

## 2016-09-02 MED ORDER — DEXAMETHASONE SODIUM PHOSPHATE 10 MG/ML IJ SOLN
10.0000 mg | Freq: Once | INTRAMUSCULAR | Status: AC
  Administered 2016-09-02: 10 mg via INTRAVENOUS

## 2016-09-02 MED ORDER — DEXAMETHASONE SODIUM PHOSPHATE 10 MG/ML IJ SOLN
INTRAMUSCULAR | Status: AC
  Filled 2016-09-02: qty 1

## 2016-09-02 MED ORDER — MEPERIDINE HCL 50 MG/ML IJ SOLN: 6.2500 mg | INTRAMUSCULAR | Status: DC | PRN

## 2016-09-02 MED ORDER — LIDOCAINE 2% (20 MG/ML) 5 ML SYRINGE
INTRAMUSCULAR | Status: AC
  Filled 2016-09-02: qty 5

## 2016-09-02 MED ORDER — ASPIRIN 81 MG PO CHEW: 81.0000 mg | CHEWABLE_TABLET | Freq: Two times a day (BID) | ORAL | 0 refills | Status: AC

## 2016-09-02 MED ORDER — FAMOTIDINE 20 MG PO TABS
40.0000 mg | ORAL_TABLET | Freq: Every day | ORAL | Status: DC
  Administered 2016-09-02: 40 mg via ORAL
  Filled 2016-09-02: qty 2

## 2016-09-02 MED ORDER — FENTANYL CITRATE (PF) 100 MCG/2ML IJ SOLN
25.0000 ug | INTRAMUSCULAR | Status: DC | PRN
  Administered 2016-09-02: 25 ug via INTRAVENOUS
  Filled 2016-09-02: qty 2

## 2016-09-02 MED ORDER — CEFAZOLIN SODIUM-DEXTROSE 2-4 GM/100ML-% IV SOLN
2.0000 g | INTRAVENOUS | Status: AC
  Administered 2016-09-02: 2 g via INTRAVENOUS

## 2016-09-02 MED ORDER — METOCLOPRAMIDE HCL 5 MG/ML IJ SOLN: 5.0000 mg | Freq: Three times a day (TID) | INTRAMUSCULAR | Status: DC | PRN

## 2016-09-02 MED ORDER — ALUM & MAG HYDROXIDE-SIMETH 200-200-20 MG/5ML PO SUSP: 15.0000 mL | ORAL | Status: DC | PRN

## 2016-09-02 MED ORDER — FAMOTIDINE 20 MG PO TABS: 20.0000 mg | ORAL_TABLET | Freq: Every day | ORAL | Status: DC

## 2016-09-02 MED ORDER — ONDANSETRON HCL 4 MG/2ML IJ SOLN: 4.0000 mg | Freq: Four times a day (QID) | INTRAMUSCULAR | Status: DC | PRN

## 2016-09-02 MED ORDER — PROPOFOL 10 MG/ML IV BOLUS
INTRAVENOUS | Status: AC
  Filled 2016-09-02: qty 60

## 2016-09-02 MED ORDER — OMEPRAZOLE 20 MG PO CPDR
20.0000 mg | DELAYED_RELEASE_CAPSULE | Freq: Every day | ORAL | Status: DC
  Administered 2016-09-03: 08:00:00 20 mg via ORAL
  Filled 2016-09-02 (×2): qty 1

## 2016-09-02 MED ORDER — HYDROCODONE-ACETAMINOPHEN 10-325 MG PO TABS
1.0000 | ORAL_TABLET | ORAL | Status: DC | PRN
  Administered 2016-09-02 (×2): 1 via ORAL
  Administered 2016-09-02: 2 via ORAL
  Administered 2016-09-02 – 2016-09-03 (×2): 1 via ORAL
  Administered 2016-09-03 (×2): 2 via ORAL
  Filled 2016-09-02: qty 2
  Filled 2016-09-02 (×4): qty 1
  Filled 2016-09-02 (×2): qty 2

## 2016-09-02 MED ORDER — CEFAZOLIN SODIUM-DEXTROSE 2-4 GM/100ML-% IV SOLN
INTRAVENOUS | Status: AC
  Filled 2016-09-02: qty 100

## 2016-09-02 MED ORDER — TRIAMTERENE-HCTZ 37.5-25 MG PO CAPS
1.0000 | ORAL_CAPSULE | Freq: Every day | ORAL | Status: DC
  Filled 2016-09-02 (×3): qty 1

## 2016-09-02 MED ORDER — SODIUM CHLORIDE 0.9 % IV SOLN
INTRAVENOUS | Status: DC
  Administered 2016-09-02 – 2016-09-03 (×3): via INTRAVENOUS

## 2016-09-02 MED ORDER — MIDAZOLAM HCL 2 MG/2ML IJ SOLN
INTRAMUSCULAR | Status: AC
  Filled 2016-09-02: qty 2

## 2016-09-02 MED ORDER — BUPIVACAINE IN DEXTROSE 0.75-8.25 % IT SOLN
INTRATHECAL | Status: DC | PRN
  Administered 2016-09-02: 13 mg via INTRATHECAL

## 2016-09-02 MED ORDER — ASPIRIN 81 MG PO CHEW
81.0000 mg | CHEWABLE_TABLET | Freq: Two times a day (BID) | ORAL | Status: DC
  Administered 2016-09-02 – 2016-09-03 (×2): 81 mg via ORAL
  Filled 2016-09-02 (×2): qty 1

## 2016-09-02 MED ORDER — SERTRALINE HCL 50 MG PO TABS
150.0000 mg | ORAL_TABLET | Freq: Every day | ORAL | Status: DC
  Administered 2016-09-02: 150 mg via ORAL
  Filled 2016-09-02: qty 1

## 2016-09-02 MED ORDER — PROPOFOL 10 MG/ML IV BOLUS
INTRAVENOUS | Status: DC | PRN
  Administered 2016-09-02 (×2): 10 mg via INTRAVENOUS

## 2016-09-02 MED ORDER — DIPHENHYDRAMINE HCL 25 MG PO CAPS: 25.0000 mg | ORAL_CAPSULE | Freq: Four times a day (QID) | ORAL | Status: DC | PRN

## 2016-09-02 MED ORDER — FENTANYL CITRATE (PF) 100 MCG/2ML IJ SOLN
INTRAMUSCULAR | Status: AC
  Filled 2016-09-02: qty 2

## 2016-09-02 MED ORDER — ALBUMIN HUMAN 5 % IV SOLN
INTRAVENOUS | Status: AC
  Filled 2016-09-02: qty 250

## 2016-09-02 MED ORDER — ALPRAZOLAM 1 MG PO TABS: 1.0000 mg | ORAL_TABLET | Freq: Three times a day (TID) | ORAL | Status: DC

## 2016-09-02 MED ORDER — CEFAZOLIN SODIUM-DEXTROSE 2-4 GM/100ML-% IV SOLN
2.0000 g | Freq: Four times a day (QID) | INTRAVENOUS | Status: AC
  Administered 2016-09-02 (×2): 2 g via INTRAVENOUS
  Filled 2016-09-02 (×2): qty 100

## 2016-09-02 MED ORDER — METHOCARBAMOL 500 MG PO TABS: 500.0000 mg | ORAL_TABLET | Freq: Four times a day (QID) | ORAL | 0 refills | Status: DC | PRN

## 2016-09-02 MED ORDER — ONDANSETRON HCL 4 MG/2ML IJ SOLN
INTRAMUSCULAR | Status: AC
  Filled 2016-09-02: qty 2

## 2016-09-02 MED ORDER — FENTANYL CITRATE (PF) 100 MCG/2ML IJ SOLN
INTRAMUSCULAR | Status: DC | PRN
  Administered 2016-09-02: 50 ug via INTRAVENOUS

## 2016-09-02 MED ORDER — ALPRAZOLAM 1 MG PO TABS
2.0000 mg | ORAL_TABLET | ORAL | Status: DC
  Administered 2016-09-02 – 2016-09-03 (×3): 2 mg via ORAL
  Filled 2016-09-02 (×3): qty 2

## 2016-09-02 MED ORDER — LEVOTHYROXINE SODIUM 75 MCG PO TABS
75.0000 ug | ORAL_TABLET | Freq: Every day | ORAL | Status: DC
  Administered 2016-09-03: 75 ug via ORAL
  Filled 2016-09-02: qty 1

## 2016-09-02 MED ORDER — TRANEXAMIC ACID 1000 MG/10ML IV SOLN
1000.0000 mg | INTRAVENOUS | Status: AC
  Administered 2016-09-02: 1000 mg via INTRAVENOUS
  Filled 2016-09-02: qty 1100

## 2016-09-02 MED ORDER — METHOCARBAMOL 500 MG PO TABS
500.0000 mg | ORAL_TABLET | Freq: Four times a day (QID) | ORAL | Status: DC | PRN
  Administered 2016-09-02 – 2016-09-03 (×3): 500 mg via ORAL
  Filled 2016-09-02 (×3): qty 1

## 2016-09-02 MED ORDER — BISACODYL 10 MG RE SUPP: 10.0000 mg | Freq: Every day | RECTAL | Status: DC | PRN

## 2016-09-02 MED ORDER — MIDAZOLAM HCL 2 MG/2ML IJ SOLN
INTRAMUSCULAR | Status: DC | PRN
  Administered 2016-09-02 (×2): 1 mg via INTRAVENOUS

## 2016-09-02 MED ORDER — FERROUS SULFATE 325 (65 FE) MG PO TABS
325.0000 mg | ORAL_TABLET | Freq: Three times a day (TID) | ORAL | Status: DC
  Filled 2016-09-02 (×2): qty 1

## 2016-09-02 MED ORDER — DEXAMETHASONE SODIUM PHOSPHATE 10 MG/ML IJ SOLN
10.0000 mg | Freq: Once | INTRAMUSCULAR | Status: AC
  Administered 2016-09-03: 10 mg via INTRAVENOUS
  Filled 2016-09-02: qty 1

## 2016-09-02 MED ORDER — NICOTINE 21 MG/24HR TD PT24: 21.0000 mg | MEDICATED_PATCH | Freq: Every day | TRANSDERMAL | Status: DC

## 2016-09-02 MED ORDER — FLUTICASONE PROPIONATE 50 MCG/ACT NA SUSP
2.0000 | Freq: Every day | NASAL | Status: DC
  Administered 2016-09-02 – 2016-09-03 (×2): 2 via NASAL
  Filled 2016-09-02: qty 16

## 2016-09-02 MED ORDER — PHENYLEPHRINE 40 MCG/ML (10ML) SYRINGE FOR IV PUSH (FOR BLOOD PRESSURE SUPPORT)
PREFILLED_SYRINGE | INTRAVENOUS | Status: AC
  Filled 2016-09-02: qty 10

## 2016-09-02 MED ORDER — LORATADINE 10 MG PO TABS
10.0000 mg | ORAL_TABLET | Freq: Every day | ORAL | Status: DC
  Administered 2016-09-02 – 2016-09-03 (×2): 10 mg via ORAL
  Filled 2016-09-02 (×2): qty 1

## 2016-09-02 MED ORDER — CHLORHEXIDINE GLUCONATE 4 % EX LIQD: 60.0000 mL | Freq: Once | CUTANEOUS | Status: DC

## 2016-09-02 MED ORDER — DOCUSATE SODIUM 100 MG PO CAPS
100.0000 mg | ORAL_CAPSULE | Freq: Two times a day (BID) | ORAL | Status: DC
  Administered 2016-09-02 – 2016-09-03 (×2): 100 mg via ORAL
  Filled 2016-09-02 (×2): qty 1

## 2016-09-02 MED ORDER — MENTHOL 3 MG MT LOZG: 1.0000 | LOZENGE | OROMUCOSAL | Status: DC | PRN

## 2016-09-02 MED ORDER — DOCUSATE SODIUM 100 MG PO CAPS: 100.0000 mg | ORAL_CAPSULE | Freq: Two times a day (BID) | ORAL | 0 refills | Status: DC

## 2016-09-02 MED ORDER — POLYETHYLENE GLYCOL 3350 17 G PO PACK
17.0000 g | PACK | Freq: Two times a day (BID) | ORAL | Status: DC
  Administered 2016-09-03: 11:00:00 17 g via ORAL
  Filled 2016-09-02 (×2): qty 1

## 2016-09-02 MED ORDER — ONDANSETRON HCL 4 MG PO TABS: 4.0000 mg | ORAL_TABLET | Freq: Four times a day (QID) | ORAL | Status: DC | PRN

## 2016-09-02 MED ORDER — PROPOFOL 500 MG/50ML IV EMUL
INTRAVENOUS | Status: DC | PRN
  Administered 2016-09-02: 50 ug/kg/min via INTRAVENOUS

## 2016-09-02 MED ORDER — POTASSIUM CHLORIDE ER 10 MEQ PO TBCR
10.0000 meq | EXTENDED_RELEASE_TABLET | Freq: Every day | ORAL | Status: DC
  Administered 2016-09-02: 10 meq via ORAL
  Filled 2016-09-02 (×2): qty 1

## 2016-09-02 MED ORDER — HYDROCODONE-ACETAMINOPHEN 10-325 MG PO TABS: 1.0000 | ORAL_TABLET | ORAL | 0 refills | Status: DC | PRN

## 2016-09-02 MED ORDER — ALBUMIN HUMAN 5 % IV SOLN
INTRAVENOUS | Status: DC | PRN
  Administered 2016-09-02: 09:00:00 via INTRAVENOUS

## 2016-09-02 SURGICAL SUPPLY — 61 items
BAG DECANTER FOR FLEXI CONT (MISCELLANEOUS) ×3 IMPLANT
BAG ZIPLOCK 12X15 (MISCELLANEOUS) ×3 IMPLANT
BLADE SAW SGTL 11.0X1.19X90.0M (BLADE) IMPLANT
BLADE SAW SGTL 18X1.27X75 (BLADE) ×2 IMPLANT
BLADE SAW SGTL 18X1.27X75MM (BLADE) ×1
BRUSH FEMORAL CANAL (MISCELLANEOUS) IMPLANT
COVER SURGICAL LIGHT HANDLE (MISCELLANEOUS) ×3 IMPLANT
DERMABOND ADVANCED (GAUZE/BANDAGES/DRESSINGS) ×2
DERMABOND ADVANCED .7 DNX12 (GAUZE/BANDAGES/DRESSINGS) ×1 IMPLANT
DRAPE ORTHO SPLIT 77X108 STRL (DRAPES) ×4
DRAPE POUCH INSTRU U-SHP 10X18 (DRAPES) ×3 IMPLANT
DRAPE SURG 17X11 SM STRL (DRAPES) ×3 IMPLANT
DRAPE SURG ORHT 6 SPLT 77X108 (DRAPES) ×2 IMPLANT
DRAPE U-SHAPE 47X51 STRL (DRAPES) ×3 IMPLANT
DRESSING AQUACEL AG SP 3.5X10 (GAUZE/BANDAGES/DRESSINGS) IMPLANT
DRSG AQUACEL AG ADV 3.5X10 (GAUZE/BANDAGES/DRESSINGS) IMPLANT
DRSG AQUACEL AG ADV 3.5X14 (GAUZE/BANDAGES/DRESSINGS) ×3 IMPLANT
DRSG AQUACEL AG SP 3.5X10 (GAUZE/BANDAGES/DRESSINGS)
DURAPREP 26ML APPLICATOR (WOUND CARE) ×3 IMPLANT
ELECT BLADE TIP CTD 4 INCH (ELECTRODE) ×3 IMPLANT
ELECT REM PT RETURN 15FT ADLT (MISCELLANEOUS) ×3 IMPLANT
FACESHIELD WRAPAROUND (MASK) ×12 IMPLANT
GAUZE SPONGE 2X2 8PLY STRL LF (GAUZE/BANDAGES/DRESSINGS) ×1 IMPLANT
GLOVE BIOGEL M 7.0 STRL (GLOVE) IMPLANT
GLOVE BIOGEL PI IND STRL 7.5 (GLOVE) ×1 IMPLANT
GLOVE BIOGEL PI IND STRL 8.5 (GLOVE) ×1 IMPLANT
GLOVE BIOGEL PI INDICATOR 7.5 (GLOVE) ×2
GLOVE BIOGEL PI INDICATOR 8.5 (GLOVE) ×2
GLOVE ECLIPSE 8.0 STRL XLNG CF (GLOVE) ×6 IMPLANT
GLOVE ORTHO TXT STRL SZ7.5 (GLOVE) ×6 IMPLANT
GOWN STRL REUS W/TWL LRG LVL3 (GOWN DISPOSABLE) ×3 IMPLANT
GOWN STRL REUS W/TWL XL LVL3 (GOWN DISPOSABLE) ×9 IMPLANT
HANDPIECE INTERPULSE COAX TIP (DISPOSABLE)
HEAD FEM CONSTRAIN MOD +9MM (Orthopedic Implant) ×3 IMPLANT
LINER G-7 ACE CONSTRAIN 36MM (Orthopedic Implant) ×3 IMPLANT
MANIFOLD NEPTUNE II (INSTRUMENTS) ×3 IMPLANT
MARKER SKIN DUAL TIP RULER LAB (MISCELLANEOUS) ×3 IMPLANT
NDL SAFETY ECLIPSE 18X1.5 (NEEDLE) ×1 IMPLANT
NEEDLE HYPO 18GX1.5 SHARP (NEEDLE) ×2
NS IRRIG 1000ML POUR BTL (IV SOLUTION) ×6 IMPLANT
PADDING CAST COTTON 6X4 STRL (CAST SUPPLIES) ×3 IMPLANT
POSITIONER SURGICAL ARM (MISCELLANEOUS) ×3 IMPLANT
PRESSURIZER FEMORAL UNIV (MISCELLANEOUS) IMPLANT
SET HNDPC FAN SPRY TIP SCT (DISPOSABLE) IMPLANT
SPONGE GAUZE 2X2 STER 10/PKG (GAUZE/BANDAGES/DRESSINGS) ×2
SPONGE LAP 18X18 X RAY DECT (DISPOSABLE) ×3 IMPLANT
SPONGE LAP 4X18 X RAY DECT (DISPOSABLE) ×3 IMPLANT
STAPLER VISISTAT 35W (STAPLE) ×3 IMPLANT
SUCTION FRAZIER HANDLE 10FR (MISCELLANEOUS) ×2
SUCTION TUBE FRAZIER 10FR DISP (MISCELLANEOUS) ×1 IMPLANT
SUT MNCRL AB 3-0 PS2 18 (SUTURE) ×3 IMPLANT
SUT VIC AB 1 CT1 36 (SUTURE) ×3 IMPLANT
SUT VIC AB 2-0 CT1 27 (SUTURE) ×6
SUT VIC AB 2-0 CT1 TAPERPNT 27 (SUTURE) ×3 IMPLANT
SUT VLOC 180 0 24IN GS25 (SUTURE) ×6 IMPLANT
TOWEL OR 17X26 10 PK STRL BLUE (TOWEL DISPOSABLE) ×6 IMPLANT
TOWER CARTRIDGE SMART MIX (DISPOSABLE) IMPLANT
TRAY FOLEY W/METER SILVER 16FR (SET/KITS/TRAYS/PACK) ×3 IMPLANT
TUBE KAMVAC SUCTION (TUBING) IMPLANT
WATER STERILE IRR 1500ML POUR (IV SOLUTION) ×3 IMPLANT
YANKAUER SUCT BULB TIP 10FT TU (MISCELLANEOUS) ×3 IMPLANT

## 2016-09-02 NOTE — Op Note (Signed)
[name], [name]  MEDICAL RECORD NO.:  [id number]  LOCATION:                                 FACILITY:  PHYSICIAN:  Pietro Cassis. Alvan Dame, M.D.       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  PROCEDURE:  Revision right total hip arthroplasty to a constrained liner.  COMPONENTS USED:  A [employer] constrained liner system.  She had a previously placed G7 size 56 acetabular shell placed from [employer].  We thus used a G7 acetabular liner, Freedom Constrained neutral size F with an inner diameter of 36 mm to match the 56 previously placed shell and a size 36 +9, 12/14 taper Freedom Constrained Modular head.  SURGEON:  Pietro Cassis. Alvan Dame, M.D.  ASSISTANT:  Danae Orleans, PA-C.  ANESTHESIA:  General.  SPECIMENS:  None.  COMPLICATION:  None.  BLOOD LOSS:  400 mL.  DRAINS:  None.  INDICATIONS FOR PROCEDURE:  Ms. Townsel is a 61 year old female, who is a patient of mine from previous revision of a metal-on-metal hip system to ceramic polyethylene with recurrent instability.  This led to revision surgery to a dual mobility design from [employer].  Despite this, she had recurrent episodes of this instability, dislocation.  She has never had any evidence of any significant gluteus medius, minimus, or maximus muscle damage nor bone damage.  The concern is perhaps soft tissue laxity, loss of muscle strength, but not a neurologic issue or any significant muscle related damage to metal issues.  Despite this, she has other issues that are going on, and the episodes of dislocation were frequent enough that her quality of life had been significantly affected.  She wished at this point to get a constrained liner.  I felt that her acetabular  shell have been placed long enough to tolerate any potential stress that she may offer. Consent was obtained for benefit of stability with risk of infection, DVT, component failure, need for future surgery, all discussed and reviewed.  Consent was obtained.  PROCEDURE IN DETAIL:  The patient was brought to the operative theater. Once adequate anesthesia, preoperative antibiotics, Ancef, administered as well as tranexamic acid and Decadron, she was positioned into the left lateral decubitus position and the right hip up.  The right lower extremity was then prepped and draped in sterile fashion.  Time-out was performed identifying the patient, planned procedure, and extremity. Her old incision was excised, soft tissue planes created.  I did excise synovialized adipose tissue as well as deeper down in the pseudo capsule.  The iliotibial band and gluteal fascia were opened.  She had a significant synovitic fluid response in the joint.  No signs of purulence.  No metal staining.  With the hip exposed, the dual mobility head system was dislocated and then disimpacted off the trunnion.  The trunnion was placed on the ilium and retracted anteriorly.  The rim of the acetabulum was exposed.  Through some effort, the metal  liner was removed.  There was no evidence of any complication of loosening of the acetabular shell in place consistent with radiographic findings of a stable ingrown acetabular shell.  At this point, we went ahead and opened up the Freedom Constrained liner.  Again, the size F liner that matched the 56 shell with a 36 inner diameter.  I did do a trial reduction and based on soft tissue tensioning, etc., elected to use the largest head ball with the hand, which was a +9 ball. This ball was then impacted onto a clean and dry trunnion appropriately positioned with an etch mark on the ball placed most superior.  The hip was reduced with a palpable reduction of the ball into  the liner.  Throughout an entire range of motion as well as stressed at the extremes of the motion, there was no evidence of any impingement, subluxation in forward flexion internal rotation or an extension external rotation.  Upon this evaluation, the hip was irrigated again and reapproximated iliotibial band and gluteal fascia using a combination of #1 Vicryl and V-Loc sutures.  The remainder of the wound was closed with 2-0 Vicryl and running 3-0 Monocryl.  The hip was then cleaned, dried, and dressed sterilely using surgical glue and Aquacel dressing.  She will be weightbearing as tolerated.  Posterior hip precautions prescribed for the first 6-12 weeks to allow for muscle strength and instability.  We will try to keep her in the hospital overnight depending on her progress of physical therapy.     Pietro Cassis Alvan Dame, M.D.     MDO/MEDQ  D:  09/02/2016  T:  09/02/2016  Job:  [id number]

## 2016-09-02 NOTE — Brief Op Note (Signed)
09/02/2016  8:54 AM  PATIENT:  [name]  61 y.o. female  PRE-OPERATIVE DIAGNOSIS:  Right failed right hip, instability  POST-OPERATIVE DIAGNOSIS:  Right failed right hip, instability  PROCEDURE:  Procedure(s): Right hip constrained liner- posterior (Right)  SURGEON:  Surgeon(s) and Role:    Paralee Cancel, MD - Primary  PHYSICIAN ASSISTANT: Danae Orleans, PA-C  ANESTHESIA:   general  EBL:  Total I/O In: 2250 [I.V.:2000; IV Piggyback:250] Out: 625 [Urine:200; Blood:425]  BLOOD ADMINISTERED:none  DRAINS: none   LOCAL MEDICATIONS USED:  NONE  SPECIMEN:  No Specimen  DISPOSITION OF SPECIMEN:  N/A  COUNTS:  YES  TOURNIQUET:  * No tourniquets in log *  DICTATION: .Other Dictation: Dictation Number [id number]  PLAN OF CARE: Admit to inpatient   PATIENT DISPOSITION:  PACU - hemodynamically stable.   Delay start of Pharmacological VTE agent (>24hrs) due to surgical blood loss or risk of bleeding: no

## 2016-09-02 NOTE — Anesthesia Postprocedure Evaluation (Signed)
Anesthesia Post Note  Patient: [name]  Procedure(s) Performed: Procedure(s) (LRB): Right hip constrained liner- posterior (Right)     Patient location during evaluation: PACU Anesthesia Type: Spinal Level of consciousness: oriented and awake and alert Pain management: pain level controlled Vital Signs Assessment: post-procedure vital signs reviewed and stable Respiratory status: spontaneous breathing, respiratory function stable and patient connected to nasal cannula oxygen Cardiovascular status: blood pressure returned to baseline and stable Postop Assessment: no headache and no backache Anesthetic complications: no    Last Vitals:  Vitals:   09/02/16 1045 09/02/16 1100  BP: 116/72 114/74  Pulse: 65 65  Resp: 14 16  Temp:  (!) 36.4 C    Last Pain:  Vitals:   09/02/16 0541  PainSc: 6     LLE Motor Response: Purposeful movement (09/02/16 1100) LLE Sensation: Full sensation (09/02/16 1100) RLE Motor Response: Purposeful movement (09/02/16 1100) RLE Sensation: Full sensation (09/02/16 1100) L Sensory Level: L5-Outer lower leg, top of foot, great toe (09/02/16 1100) R Sensory Level: S1-Sole of foot, small toes (09/02/16 1100)  Michelle Vanhise

## 2016-09-02 NOTE — Transfer of Care (Signed)
Immediate Anesthesia Transfer of Care Note  Patient: [name]  Procedure(s) Performed: Procedure(s): Right hip constrained liner- posterior (Right)  Patient Location: PACU  Anesthesia Type:MAC and Spinal  Level of Consciousness: awake, alert , oriented and patient cooperative  Airway & Oxygen Therapy: Patient Spontanous Breathing and Patient connected to face mask oxygen  Post-op Assessment: Report given to RN and Post -op Vital signs reviewed and stable  Post vital signs: Reviewed and stable  Last Vitals:  Vitals:   09/02/16 0526  BP: 122/89  Pulse: 81  Resp: 18  Temp: 36.6 C    Last Pain:  Vitals:   09/02/16 0541  PainSc: 6       Patients Stated Pain Goal: 4 (67/20/94 7096)  Complications: No apparent anesthesia complications

## 2016-09-02 NOTE — Progress Notes (Signed)
[facility] unable to take patient at this time

## 2016-09-02 NOTE — Interval H&P Note (Signed)
History and Physical Interval Note:  09/02/2016 7:01 AM  [name]  has presented today for surgery, with the diagnosis of Right failed right hip, instability  The various methods of treatment have been discussed with the patient and family. After consideration of risks, benefits and other options for treatment, the patient has consented to  Procedure(s): Right hip constrained liner- posterior (Right) as a surgical intervention .  The patient's history has been reviewed, patient examined, no change in status, stable for surgery.  I have reviewed the patient's chart and labs.  Questions were answered to the patient's satisfaction.     Mauri Pole

## 2016-09-02 NOTE — Anesthesia Procedure Notes (Signed)
Spinal  Patient location during procedure: OR Start time: 09/02/2016 7:31 AM End time: 09/02/2016 7:36 AM Staffing Anesthesiologist: Fredrick Dray Preanesthetic Checklist Completed: patient identified, site marked, surgical consent, pre-op evaluation, timeout performed, IV checked, risks and benefits discussed and monitors and equipment checked Spinal Block Patient position: sitting Prep: DuraPrep Patient monitoring: heart rate, cardiac monitor, continuous pulse ox and blood pressure Approach: midline Location: L3-4 Injection technique: single-shot Needle Needle type: Sprotte  Needle gauge: 24 G Needle length: 9 cm Assessment Sensory level: T4

## 2016-09-02 NOTE — Anesthesia Procedure Notes (Signed)
Procedure Name: MAC Date/Time: 09/02/2016 7:34 AM Performed by: Dione Booze Pre-anesthesia Checklist: Patient identified, Emergency Drugs available, Suction available and Patient being monitored Patient Re-evaluated:Patient Re-evaluated prior to induction Oxygen Delivery Method: Simple face mask Placement Confirmation: positive ETCO2

## 2016-09-02 NOTE — Evaluation (Signed)
Physical Therapy Evaluation Patient Details Name: [name] MRN: [id number] DOB: 10-01-1955 Today's Date: 09/02/2016   History of Present Illness  R posterior hip revision; PMH of 2 prior revisions, 9 dislocations  Clinical Impression  Pt is s/p THA resulting in the deficits listed below (see PT Problem List). Pt ambulated 61' with RW, good progress expected.  Pt will benefit from skilled PT to increase their independence and safety with mobility to allow discharge to the venue listed below.      Follow Up Recommendations DC plan and follow up therapy as arranged by surgeon    Equipment Recommendations  None recommended by PT    Recommendations for Other Services       Precautions / Restrictions Precautions Precautions: Posterior Hip Precaution Booklet Issued: Yes (comment) Precaution Comments: 9 dislocations with a fall in past year Restrictions Weight Bearing Restrictions: No Other Position/Activity Restrictions: WBAT      Mobility  Bed Mobility Overal bed mobility: Modified Independent                Transfers Overall transfer level: Needs assistance Equipment used: Rolling walker (2 wheeled) Transfers: Sit to/from Stand Sit to Stand: Min assist         General transfer comment: min A to rise, VCs hand placement  Ambulation/Gait Ambulation/Gait assistance: Min guard Ambulation Distance (Feet): 50 Feet Assistive device: Rolling walker (2 wheeled) Gait Pattern/deviations: Step-to pattern;Decreased step length - right;Decreased step length - left   Gait velocity interpretation: Below normal speed for age/gender General Gait Details: steady with no LOB  Stairs            Wheelchair Mobility    Modified Rankin (Stroke Patients Only)       Balance Overall balance assessment: Modified Independent                                           Pertinent Vitals/Pain Pain Assessment: 0-10 Pain Score: 7  Pain Location: R  hip Pain Descriptors / Indicators: Sore Pain Intervention(s): Limited activity within patient's tolerance;Monitored during session;Premedicated before session;Ice applied    Home Living Family/patient expects to be discharged to:: Private residence Living Arrangements: Spouse/significant other Available Help at Discharge: Family;Available 24 hours/day Type of Home: House Home Access: Stairs to enter Entrance Stairs-Rails: Chemical engineer of Steps: 4-5 Home Layout: One level Home Equipment: Walker - 2 wheels;Cane - single point Additional Comments: has 2 elevated toilets also    Prior Function Level of Independence: Independent with assistive device(s)         Comments: used RW in house and SPC going out     Hand Dominance        Extremity/Trunk Assessment   Upper Extremity Assessment Upper Extremity Assessment: Overall WFL for tasks assessed    Lower Extremity Assessment Lower Extremity Assessment: RLE deficits/detail RLE Deficits / Details: hip flexion -3/5, knee ext 4/5    Cervical / Trunk Assessment Cervical / Trunk Assessment: Normal  Communication   Communication: No difficulties  Cognition Arousal/Alertness: Awake/alert Behavior During Therapy: WFL for tasks assessed/performed Overall Cognitive Status: Within Functional Limits for tasks assessed                                        General Comments  Exercises Total Joint Exercises Ankle Circles/Pumps: AROM;Both;10 reps;Supine Quad Sets: AROM;Both;5 reps;Supine Heel Slides: AAROM;Right;10 reps;Supine Hip ABduction/ADduction: AAROM;Right;10 reps;Supine   Assessment/Plan    PT Assessment Patient needs continued PT services  PT Problem List Decreased strength;Decreased range of motion;Decreased activity tolerance;Decreased mobility;Pain       PT Treatment Interventions DME instruction;Gait training;Functional mobility training;Therapeutic  exercise;Patient/family education;Therapeutic activities    PT Goals (Current goals can be found in the Care Plan section)  Acute Rehab PT Goals Patient Stated Goal: play with dog PT Goal Formulation: With patient/family Time For Goal Achievement: 09/09/16 Potential to Achieve Goals: Good    Frequency 7X/week   Barriers to discharge        Co-evaluation               AM-PAC PT "6 Clicks" Daily Activity  Outcome Measure Difficulty turning over in bed (including adjusting bedclothes, sheets and blankets)?: None Difficulty moving from lying on back to sitting on the side of the bed? : None Difficulty sitting down on and standing up from a chair with arms (e.g., wheelchair, bedside commode, etc,.)?: Total Help needed moving to and from a bed to chair (including a wheelchair)?: A Little Help needed walking in hospital room?: A Little Help needed climbing 3-5 steps with a railing? : A Lot 6 Click Score: 17    End of Session Equipment Utilized During Treatment: Gait belt Activity Tolerance: Patient tolerated treatment well Patient left: in chair;with call bell/phone within reach Nurse Communication: Mobility status PT Visit Diagnosis: Pain;Repeated falls (R29.6);Difficulty in walking, not elsewhere classified (R26.2) Pain - Right/Left: Right Pain - part of body: Hip    Time: 4540-9811 PT Time Calculation (min) (ACUTE ONLY): 18 min   Charges:   PT Evaluation $PT Eval Low Complexity: 1 Low     PT G CodesPhilomena Doheny 09/02/2016, 5:24 PM [phone]

## 2016-09-03 ENCOUNTER — Encounter (HOSPITAL_COMMUNITY): Payer: Self-pay | Admitting: Orthopedic Surgery

## 2016-09-03 LAB — CBC
HCT: 27.6 % — ABNORMAL LOW (ref 36.0–46.0)
HEMOGLOBIN: 8.4 g/dL — AB (ref 12.0–15.0)
MCH: 25.7 pg — AB (ref 26.0–34.0)
MCHC: 30.4 g/dL (ref 30.0–36.0)
MCV: 84.4 fL (ref 78.0–100.0)
Platelets: 172 10*3/uL (ref 150–400)
RBC: 3.27 MIL/uL — AB (ref 3.87–5.11)
RDW: 19.1 % — ABNORMAL HIGH (ref 11.5–15.5)
WBC: 10.6 10*3/uL — ABNORMAL HIGH (ref 4.0–10.5)

## 2016-09-03 LAB — BASIC METABOLIC PANEL
Anion gap: 7 (ref 5–15)
BUN: 10 mg/dL (ref 6–20)
CHLORIDE: 108 mmol/L (ref 101–111)
CO2: 26 mmol/L (ref 22–32)
Calcium: 8.1 mg/dL — ABNORMAL LOW (ref 8.9–10.3)
Creatinine, Ser: 0.69 mg/dL (ref 0.44–1.00)
GFR calc non Af Amer: >60 mL/min (ref >60)
Glucose, Bld: 140 mg/dL — ABNORMAL HIGH (ref 65–99)
POTASSIUM: 3.8 mmol/L (ref 3.5–5.1)
SODIUM: 141 mmol/L (ref 135–145)

## 2016-09-03 NOTE — Progress Notes (Signed)
Physical Therapy Treatment Patient Details Name: [name] MRN: [id number] DOB: [birth year] Today's Date: 09/03/2016    History of Present Illness R posterior hip revision; PMH of 2 prior revisions, 9 dislocations    PT Comments    POD # 1 pm session Assisted OOB to amb a greater distance.  Returned to room and performed some standing TE's.  Instructed on proper tech, freq as well as use of ICE.    Follow Up Recommendations  DC plan and follow up therapy as arranged by surgeon     Equipment Recommendations  None recommended by PT    Recommendations for Other Services       Precautions / Restrictions Precautions Precautions: Posterior Hip Precaution Comments: 9 dislocations with a fall in past year Restrictions Weight Bearing Restrictions: No Other Position/Activity Restrictions: WBAT    Mobility  Bed Mobility               General bed mobility comments: OOB in recliner   Transfers Overall transfer level: Needs assistance Equipment used: Rolling walker (2 wheeled) Transfers: Sit to/from [facility] Sit to Stand: Supervision;Min guard Stand pivot transfers: Supervision;Min guard       General transfer comment: increased time with caution to THP  Ambulation/Gait Ambulation/Gait assistance: Min guard;Supervision Ambulation Distance (Feet): 90 Feet Assistive device: Rolling walker (2 wheeled) Gait Pattern/deviations: Step-to pattern;Decreased step length - right;Decreased step length - left Gait velocity: WFL   General Gait Details: tolerated an increased distance   Science writer    Modified Rankin (Stroke Patients Only)       Balance                                            Cognition Arousal/Alertness: Awake/alert Behavior During Therapy: WFL for tasks assessed/performed Overall Cognitive Status: Within Functional Limits for tasks assessed                                         Exercises   10 reps standing TE's marching, ABd, hamstrings   General Comments        Pertinent Vitals/Pain Pain Assessment: 0-10 Pain Score: 6  Pain Location: R hip Pain Descriptors / Indicators: Sore;Operative site guarding;Tender Pain Intervention(s): Monitored during session;Repositioned;Ice applied    Home Living                      Prior Function            PT Goals (current goals can now be found in the care plan section) Progress towards PT goals: Progressing toward goals    Frequency    7X/week      PT Plan Current plan remains appropriate    Co-evaluation              AM-PAC PT "6 Clicks" Daily Activity  Outcome Measure  Difficulty turning over in bed (including adjusting bedclothes, sheets and blankets)?: A Little Difficulty moving from lying on back to sitting on the side of the bed? : A Little Difficulty sitting down on and standing up from a chair with arms (e.g., wheelchair, bedside commode, etc,.)?: A Little Help needed moving to and from a bed to chair (  including a wheelchair)?: A Little Help needed walking in hospital room?: A Little Help needed climbing 3-5 steps with a railing? : A Little 6 Click Score: 18    End of Session Equipment Utilized During Treatment: Gait belt Activity Tolerance: Patient tolerated treatment well Patient left: in chair;with call bell/phone within reach Nurse Communication: Mobility status PT Visit Diagnosis: Pain;Repeated falls (R29.6);Difficulty in walking, not elsewhere classified (R26.2) Pain - Right/Left: Right     Time: 2482-5003 PT Time Calculation (min) (ACUTE ONLY): 13 min  Charges:  $Gait Training: 8-22 mins                     G Codes:       {Heidi Lemay  PTA [facility]  Acute  Rehab Pager      [phone]

## 2016-09-03 NOTE — Progress Notes (Signed)
Physical Therapy Treatment Patient Details Name: [name] MRN: [id number] DOB: 1955/02/26 Today's Date: 09/03/2016    History of Present Illness R posterior hip revision; PMH of 2 prior revisions, 9 dislocations    PT Comments    POD # 1 am session Assisted with amb a greater distance.  performed some THR TE's followed by ICE.  Pt plans to D/C to home today.   Follow Up Recommendations  DC plan and follow up therapy as arranged by surgeon     Equipment Recommendations  None recommended by PT    Recommendations for Other Services       Precautions / Restrictions Precautions Precautions: Posterior Hip Precaution Comments: 9 dislocations with a fall in past year Restrictions Weight Bearing Restrictions: No Other Position/Activity Restrictions: WBAT    Mobility  Bed Mobility               General bed mobility comments: OOB in recliner   Transfers Overall transfer level: Needs assistance Equipment used: Rolling walker (2 wheeled) Transfers: Sit to/from [facility] Sit to Stand: Supervision;Min guard Stand pivot transfers: Supervision;Min guard       General transfer comment: increased time with caution to THP  Ambulation/Gait Ambulation/Gait assistance: Min guard;Supervision Ambulation Distance (Feet): 75 Feet Assistive device: Rolling walker (2 wheeled) Gait Pattern/deviations: Step-to pattern;Decreased step length - right;Decreased step length - left Gait velocity: WFL   General Gait Details: tolerated an increased distance   Science writer    Modified Rankin (Stroke Patients Only)       Balance                                            Cognition Arousal/Alertness: Awake/alert Behavior During Therapy: WFL for tasks assessed/performed Overall Cognitive Status: Within Functional Limits for tasks assessed                                        Exercises    Total Hip Replacement TE's 10 reps ankle pumps 10 reps knee presses 10 reps LAQ's 10 reps ABD AAROM Followed by ICE     General Comments        Pertinent Vitals/Pain Pain Assessment: 0-10 Pain Score: 6  Pain Location: R hip Pain Descriptors / Indicators: Sore;Operative site guarding;Tender Pain Intervention(s): Monitored during session;Repositioned;Ice applied    Home Living                      Prior Function            PT Goals (current goals can now be found in the care plan section) Progress towards PT goals: Progressing toward goals    Frequency    7X/week      PT Plan Current plan remains appropriate    Co-evaluation              AM-PAC PT "6 Clicks" Daily Activity  Outcome Measure  Difficulty turning over in bed (including adjusting bedclothes, sheets and blankets)?: A Little Difficulty moving from lying on back to sitting on the side of the bed? : A Little Difficulty sitting down on and standing up from a chair with arms (e.g., wheelchair, bedside commode, etc,.)?: A Little  Help needed moving to and from a bed to chair (including a wheelchair)?: A Little Help needed walking in hospital room?: A Little Help needed climbing 3-5 steps with a railing? : A Little 6 Click Score: 18    End of Session Equipment Utilized During Treatment: Gait belt Activity Tolerance: Patient tolerated treatment well Patient left: in chair;with call bell/phone within reach Nurse Communication: Mobility status PT Visit Diagnosis: Pain;Repeated falls (R29.6);Difficulty in walking, not elsewhere classified (R26.2) Pain - Right/Left: Right     Time: 4503-8882 PT Time Calculation (min) (ACUTE ONLY): 28 min  Charges:  $Gait Training: 8-22 mins $Therapeutic Activity: 8-22 mins                    G Codes:       Rica Koyanagi  PTA [facility]  Acute  Rehab Pager      [phone]

## 2016-09-03 NOTE — Evaluation (Signed)
Occupational Therapy Evaluation Patient Details Name: [name] MRN: [id number] DOB: 1955-03-03 Today's Date: 09/03/2016    History of Present Illness R posterior hip revision; PMH of 2 prior revisions, 9 dislocations   Clinical Impression   Patient evaluated by Occupational Therapy with no further acute OT needs identified. All education has been completed and the patient has no further questions. Reviewed posterior THA precautions and safety with ADLs. See below for any follow-up Occupational Therapy or equipment needs. OT is signing off. Thank you for this referral.      Follow Up Recommendations  No OT follow up;Supervision/Assistance - 24 hour    Equipment Recommendations  None recommended by OT    Recommendations for Other Services       Precautions / Restrictions Precautions Precautions: Posterior Hip Precaution Comments: 9 dislocations with a fall in past year Restrictions Weight Bearing Restrictions: No Other Position/Activity Restrictions: WBAT      Mobility Bed Mobility Overal bed mobility: Needs Assistance Bed Mobility: Sit to Supine       Sit to supine: Supervision   General bed mobility comments: Pt initially crossed Lt ankle under Rt to assist Rt LE into bed.  Extensive discussion with pt re: proper technique   Transfers Overall transfer level: Needs assistance Equipment used: Rolling walker (2 wheeled) Transfers: Sit to/from [facility] Sit to Stand: Supervision Stand pivot transfers: Supervision            Balance                                           ADL either performed or assessed with clinical judgement   ADL Overall ADL's : Needs assistance/impaired Eating/Feeding: Independent   Grooming: Wash/dry hands;Wash/dry face;Oral care;Brushing hair;Min guard;Standing   Upper Body Bathing: Set up;Sitting   Lower Body Bathing: Min guard;Sit to/from stand;With adaptive equipment;Adhering to hip  precautions   Upper Body Dressing : Set up;Sitting   Lower Body Dressing: Minimal assistance;With adaptive equipment;Sit to/from stand Lower Body Dressing Details (indicate cue type and reason): Pt instructed in use of AE.  She has a Secondary school teacher and reports she seldomly wears socks and will have her spouse assist with those  Toilet Transfer: Min guard;Ambulation;Grab bars;RW   Toileting- Water quality scientist and Hygiene: Min guard;Sit to/from stand     Tub/Shower Transfer Details (indicate cue type and reason): reviewed safety with shower transfer.  Pt reports 3in1 nor a seat will fit in her shower.  Instructed her to sponge bathe until stable to stand in shower  Functional mobility during ADLs: Min guard;Rolling walker General ADL Comments: Pt reports she was never instructed in posterior THA precautions with her initial surgery ~ 10 years ago.  Reviewed precautions extensively with pt and spouse      Vision         Perception     Praxis      Pertinent Vitals/Pain Pain Assessment: 0-10 Pain Score: 6  Pain Location: R hip Pain Descriptors / Indicators: Sore;Operative site guarding;Tender Pain Intervention(s): Monitored during session     Hand Dominance Right   Extremity/Trunk Assessment Upper Extremity Assessment Upper Extremity Assessment: Overall WFL for tasks assessed   Lower Extremity Assessment Lower Extremity Assessment: Defer to PT evaluation   Cervical / Trunk Assessment Cervical / Trunk Assessment: Normal   Communication Communication Communication: No difficulties   Cognition Arousal/Alertness: Awake/alert Behavior During Therapy:  WFL for tasks assessed/performed Overall Cognitive Status: Within Functional Limits for tasks assessed                                     General Comments       Exercises     Shoulder Instructions      Home Living Family/patient expects to be discharged to:: Private residence Living Arrangements:  Spouse/significant other Available Help at Discharge: Family;Available 24 hours/day Type of Home: House Home Access: Stairs to enter [employer] of Steps: 4-5 Entrance Stairs-Rails: Left;Right Home Layout: One level     Bathroom Shower/Tub: Occupational psychologist: Standard     Home Equipment: Environmental consultant - 2 wheels;Cane - single point   Additional Comments: has 2 elevated toilets also      Prior Functioning/Environment Level of Independence: Independent with assistive device(s)        Comments: used RW in house and SPC going out        OT Problem List: Decreased strength;Decreased activity tolerance;Impaired balance (sitting and/or standing);Pain;Decreased knowledge of precautions;Decreased knowledge of use of DME or AE      OT Treatment/Interventions:      OT Goals(Current goals can be found in the care plan section) Acute Rehab OT Goals Patient Stated Goal: go home  OT Goal Formulation: All assessment and education complete, DC therapy  OT Frequency:     Barriers to D/C:            Co-evaluation              AM-PAC PT "6 Clicks" Daily Activity     Outcome Measure Help from another person eating meals?: None Help from another person taking care of personal grooming?: A Little Help from another person toileting, which includes using toliet, bedpan, or urinal?: A Little Help from another person bathing (including washing, rinsing, drying)?: A Little Help from another person to put on and taking off regular upper body clothing?: A Little Help from another person to put on and taking off regular lower body clothing?: A Little 6 Click Score: 19   End of Session Equipment Utilized During Treatment: Rolling walker Nurse Communication: Mobility status  Activity Tolerance: Patient tolerated treatment well Patient left: in bed;with call bell/phone within reach;with family/visitor present  OT Visit Diagnosis: Pain Pain - Right/Left:  Right Pain - part of body: Hip                Time: 8315-1761 OT Time Calculation (min): 55 min Charges:  OT General Charges $OT Visit: 1 Procedure OT Evaluation $OT Eval Moderate Complexity: 1 Procedure OT Treatments $Self Care/Home Management : 38-52 mins G-Codes:     [facility], OTR/L [phone]   Piccola Arico M 09/03/2016, 8:12 PM

## 2016-09-03 NOTE — Progress Notes (Signed)
Discharge planning, no HH needs identified. No PT planned, has DME. [phone]

## 2016-09-03 NOTE — Discharge Instructions (Signed)

## 2016-09-03 NOTE — Progress Notes (Signed)
Patient ID: [name], female   DOB: October 01, 1955, 61 y.o.   MRN: [id number] Subjective: 1 Day Post-Op Procedure(s) (LRB): Right hip constrained liner- posterior (Right)    Patient reports pain as mild to moderate.  No adverse events overnight,  Objective:   VITALS:   Vitals:   09/03/16 0128 09/03/16 0545  BP: 107/61 101/80  Pulse: 71 82  Resp: 16 16  Temp: 97.7 F (36.5 C) 98.3 F (36.8 C)    Neurovascular intact Incision: dressing C/D/I  LABS  Recent Labs  09/03/16 0637  HGB 8.4*  HCT 27.6*  WBC 10.6*  PLT 172     Recent Labs  09/03/16 0637  NA 141  K 3.8  BUN 10  CREATININE 0.69  GLUCOSE 140*    No results for input(s): LABPT, INR in the last 72 hours.   Assessment/Plan: 1 Day Post-Op Procedure(s) (LRB): Right hip constrained liner- posterior (Right)   Advance diet Up with therapy  Plan to be d/c to home today after therapy RTC in 2 weeks Had some questions re her cholesterol medication which I suggested she follow up with her PCP tomorrow or day after

## 2016-09-16 NOTE — Discharge Summary (Signed)
Physician Discharge Summary  Patient ID: TASMIN EXANTUS MRN: [id number] DOB/AGE: 1955-08-14 61 y.o.  Admit date: 09/02/2016 Discharge date: 09/03/2016   Procedures:  Procedure(s) (LRB): Right hip constrained liner- posterior (Right)  Attending Physician:  Dr. Paralee Cancel   Admission Diagnoses:   Failed right hip arthroplasty, instability  Discharge Diagnoses:  Principal Problem:   S/P right hip revision Active Problems:   S/P hip replacement  Past Medical History:  Diagnosis Date  . Allergic rhinitis   . Anemia   . Anxiety   . Chronic back pain   . COPD (chronic obstructive pulmonary disease) ([place])   . Depression   . Essential hypertension   . GERD (gastroesophageal reflux disease)   . Headache    migraines  . History of gastritis    EGD 2015  . History of home oxygen therapy    2 liters at hs last 6 months  . Hypothyroidism   . Osteoarthritis    oa  . Scoliosis     HPI:    [name], 61 y.o. female, has a history of pain and functional disability in the right hip due to instability and patient has failed non-surgical conservative treatments for greater than 12 weeks to include NSAID's and/or analgesics, use of assistive devices and activity modification. The indications for the revision total hip arthroplasty are instability with multiple dislocations. Onset of symptoms was gradual starting  years ago with gradually worsening course since that time.  Prior procedures on the right hip include arthroplasty. Patient currently rates pain in the right hip at 6 out of 10 with activity.  There is no significant pain unless the hip is dislocated. Patient has evidence of previous THA by imaging studies.  This condition presents safety issues increasing the risk of falls.   There is no current active infection.  Risks, benefits and expectations were discussed with the patient.  Risks including but not limited to the risk of anesthesia, blood clots, nerve damage, blood  vessel damage, failure of the prosthesis, infection and up to and including death.  Patient understand the risks, benefits and expectations and wishes to proceed with surgery.   PCP: Deloria Lair., MD   Discharged Condition: good  Hospital Course:  Patient underwent the above stated procedure on 09/02/2016. Patient tolerated the procedure well and brought to the recovery room in good condition and subsequently to the floor.  POD #1 BP: 101/80 ; Pulse: 82 ; Temp: 98.3 F (36.8 C) ; Resp: 16 Patient reports pain as mild to moderate.  No adverse events overnight.  Ready to be discharged home.  Neurovascular intact and incision: dressing C/D/I.  LABS  Basename    HGB     8.4  HCT     27.6    Discharge Exam: General appearance: alert, cooperative and no distress Extremities: Homans sign is negative, no sign of DVT, no edema, redness or tenderness in the calves or thighs and no ulcers, gangrene or trophic changes  Disposition: Home with follow up in 2 weeks   Follow-up Information    Paralee Cancel, MD. Schedule an appointment as soon as possible for a visit in 2 week(s).   Specialty:  Orthopedic Surgery Contact information: [address] [place] [address] [phone]           Discharge Instructions    Call MD / Call 911    Complete by:  As directed    If you experience chest pain or shortness  of breath, CALL 911 and be transported to the hospital emergency room.  If you develope a fever above 101 F, pus (white drainage) or increased drainage or redness at the wound, or calf pain, call your surgeon's office.   Change dressing    Complete by:  As directed    Maintain surgical dressing until follow up in the clinic. If the edges start to pull up, may reinforce with tape. If the dressing is no longer working, may remove and cover with gauze and tape, but must keep the area dry and clean.  Call with any questions or concerns.   Constipation Prevention     Complete by:  As directed    Drink plenty of fluids.  Prune juice may be helpful.  You may use a stool softener, such as Colace (over the counter) 100 mg twice a day.  Use MiraLax (over the counter) for constipation as needed.   Diet - low sodium heart healthy    Complete by:  As directed    Discharge instructions    Complete by:  As directed    Maintain surgical dressing until follow up in the clinic. If the edges start to pull up, may reinforce with tape. If the dressing is no longer working, may remove and cover with gauze and tape, but must keep the area dry and clean.  Follow up in 2 weeks at [facility]. Call with any questions or concerns.   Increase activity slowly as tolerated    Complete by:  As directed    Weight bearing as tolerated with assist device (walker, cane, etc) as directed, use it as long as suggested by your surgeon or therapist, typically at least 4-6 weeks.   TED hose    Complete by:  As directed    Use stockings (TED hose) for 2 weeks on both leg(s).  You may remove them at night for sleeping.      Allergies as of 09/03/2016      Reactions   Aleve [naproxen Sodium] Other (See Comments)   Headache   Codeine    REACTION: GI upset   Penicillins Other (See Comments)   GI upset Has patient had a PCN reaction causing immediate rash, facial/tongue/throat swelling, SOB or lightheadedness with hypotension: No Has patient had a PCN reaction causing severe rash involving mucus membranes or skin necrosis: No Has patient had a PCN reaction that required hospitalization No Has patient had a PCN reaction occurring within the last 10 years: No If all of the above answers are "NO", then may proceed with Cephalosporin use.   Sulfonamide Derivatives Hives      Medication List    STOP taking these medications   GOODY HEADACHE PO   meloxicam 15 MG tablet Commonly known as:  MOBIC     TAKE these medications   ALPRAZolam 1 MG tablet Commonly known as:   XANAX Take 1-2 mg by mouth 3 (three) times daily. Take 1 tablet in the morning, 2 tablets in the afternoon, & 2 tablets at bedtime.   aspirin 81 MG chewable tablet Chew 1 tablet (81 mg total) by mouth 2 (two) times daily. Take for 4 weeks.   atorvastatin 20 MG tablet Commonly known as:  LIPITOR Take 20 mg by mouth at bedtime.   docusate sodium 100 MG capsule Commonly known as:  COLACE Take 1 capsule (100 mg total) by mouth 2 (two) times daily.   ferrous sulfate 325 (65 FE) MG tablet Commonly known as:  FERROUSUL Take 1 tablet (325 mg total) by mouth 3 (three) times daily with meals.   fluticasone 50 MCG/ACT nasal spray Commonly known as:  FLONASE Place 2 sprays into both nostrils daily.   HYDROcodone-acetaminophen 10-325 MG tablet Commonly known as:  NORCO Take 1-2 tablets by mouth every 4 (four) hours as needed. What changed:  when to take this  reasons to take this  additional instructions   levothyroxine 75 MCG tablet Commonly known as:  SYNTHROID, LEVOTHROID Take 75 mcg by mouth daily before breakfast.   loratadine 10 MG tablet Commonly known as:  CLARITIN Take 10 mg by mouth daily.   methocarbamol 500 MG tablet Commonly known as:  ROBAXIN Take 1 tablet (500 mg total) by mouth every 6 (six) hours as needed for muscle spasms. What changed:  when to take this  reasons to take this   omeprazole 20 MG capsule Commonly known as:  PRILOSEC Take 20 mg by mouth daily before breakfast.   ondansetron 4 MG tablet Commonly known as:  ZOFRAN Take 1 tablet (4 mg total) by mouth every 6 (six) hours as needed for nausea.   OXYGEN Inhale 2 L into the lungs at bedtime.   polyethylene glycol packet Commonly known as:  MIRALAX / GLYCOLAX Take 17 g by mouth 2 (two) times daily.   potassium chloride 10 MEQ tablet Commonly known as:  K-DUR Take 10 mEq by mouth at bedtime.   ranitidine 150 MG capsule Commonly known as:  ZANTAC Take 300 mg by mouth at bedtime.    sertraline 100 MG tablet Commonly known as:  ZOLOFT Take 150 mg by mouth at bedtime.   tetrahydrozoline 0.05 % ophthalmic solution Place 1 drop into both eyes 3 (three) times daily as needed (for dry eyes.).   triamterene-hydrochlorothiazide 37.5-25 MG capsule Commonly known as:  DYAZIDE Take 1 capsule by mouth daily.        Signed: West Pugh. Larisa Lanius   PA-C  09/16/2016, 1:47 PM

## 2017-01-02 DIAGNOSIS — F341 Dysthymic disorder: Secondary | ICD-10-CM | POA: Diagnosis not present

## 2017-01-15 DIAGNOSIS — I10 Essential (primary) hypertension: Secondary | ICD-10-CM | POA: Diagnosis not present

## 2017-01-15 DIAGNOSIS — J441 Chronic obstructive pulmonary disease with (acute) exacerbation: Secondary | ICD-10-CM | POA: Diagnosis not present

## 2017-01-15 DIAGNOSIS — F17200 Nicotine dependence, unspecified, uncomplicated: Secondary | ICD-10-CM | POA: Diagnosis not present

## 2017-01-16 DIAGNOSIS — F341 Dysthymic disorder: Secondary | ICD-10-CM | POA: Diagnosis not present

## 2017-01-30 DIAGNOSIS — Z96641 Presence of right artificial hip joint: Secondary | ICD-10-CM | POA: Diagnosis not present

## 2017-01-30 DIAGNOSIS — M4180 Other forms of scoliosis, site unspecified: Secondary | ICD-10-CM | POA: Diagnosis not present

## 2017-01-30 DIAGNOSIS — Z471 Aftercare following joint replacement surgery: Secondary | ICD-10-CM | POA: Diagnosis not present

## 2017-01-30 DIAGNOSIS — M1712 Unilateral primary osteoarthritis, left knee: Secondary | ICD-10-CM | POA: Diagnosis not present

## 2017-01-31 DIAGNOSIS — F341 Dysthymic disorder: Secondary | ICD-10-CM | POA: Diagnosis not present

## 2017-02-14 DIAGNOSIS — F341 Dysthymic disorder: Secondary | ICD-10-CM | POA: Diagnosis not present

## 2017-02-17 DIAGNOSIS — M48061 Spinal stenosis, lumbar region without neurogenic claudication: Secondary | ICD-10-CM | POA: Diagnosis not present

## 2017-02-17 DIAGNOSIS — M545 Low back pain: Secondary | ICD-10-CM | POA: Diagnosis not present

## 2017-02-18 ENCOUNTER — Other Ambulatory Visit: Payer: Self-pay | Admitting: Orthopedic Surgery

## 2017-02-18 DIAGNOSIS — M545 Low back pain: Principal | ICD-10-CM

## 2017-02-18 DIAGNOSIS — G8929 Other chronic pain: Secondary | ICD-10-CM

## 2017-02-28 ENCOUNTER — Institutional Professional Consult (permissible substitution): Payer: BLUE CROSS/BLUE SHIELD | Admitting: Internal Medicine

## 2017-03-04 ENCOUNTER — Ambulatory Visit
Admission: RE | Admit: 2017-03-04 | Discharge: 2017-03-04 | Disposition: A | Discharge status: Home / Self Care | Payer: BLUE CROSS/BLUE SHIELD | Source: Ambulatory Visit | Attending: Orthopedic Surgery | Admitting: Orthopedic Surgery

## 2017-03-04 ENCOUNTER — Other Ambulatory Visit: Payer: Self-pay | Admitting: Orthopedic Surgery

## 2017-03-04 ENCOUNTER — Ambulatory Visit
Admission: RE | Admit: 2017-03-04 | Discharge: 2017-03-04 | Disposition: A | Discharge status: Home / Self Care | Payer: Self-pay | Source: Ambulatory Visit | Attending: Orthopedic Surgery | Admitting: Orthopedic Surgery

## 2017-03-04 DIAGNOSIS — M545 Low back pain, unspecified: Secondary | ICD-10-CM

## 2017-03-04 DIAGNOSIS — G8929 Other chronic pain: Secondary | ICD-10-CM

## 2017-03-04 DIAGNOSIS — M5126 Other intervertebral disc displacement, lumbar region: Secondary | ICD-10-CM | POA: Diagnosis not present

## 2017-03-04 MED ORDER — DIAZEPAM 5 MG PO TABS
10.0000 mg | ORAL_TABLET | Freq: Once | ORAL | Status: AC
  Administered 2017-03-04: 10 mg via ORAL

## 2017-03-04 MED ORDER — MEPERIDINE HCL 100 MG/ML IJ SOLN
100.0000 mg | Freq: Once | INTRAMUSCULAR | Status: AC
  Administered 2017-03-04: 100 mg via INTRAMUSCULAR

## 2017-03-04 MED ORDER — IOPAMIDOL (ISOVUE-M 200) INJECTION 41%
15.0000 mL | Freq: Once | INTRAMUSCULAR | Status: AC
  Administered 2017-03-04: 15 mL via INTRATHECAL

## 2017-03-04 MED ORDER — ONDANSETRON HCL 4 MG/2ML IJ SOLN
4.0000 mg | Freq: Once | INTRAMUSCULAR | Status: AC
Start: 2017-03-04 — End: 2017-03-04
  Administered 2017-03-04: 4 mg via INTRAMUSCULAR

## 2017-03-04 NOTE — Progress Notes (Signed)
Patient states her last dose of Goody's Powders was more than three days ago and that her last dose of Zoloft was at least two days ago.

## 2017-03-04 NOTE — Discharge Instructions (Signed)
Myelogram Discharge Instructions  1. Go home and rest quietly for the next 24 hours.  It is important to lie flat for the next 24 hours.  Get up only to go to the restroom.  You may lie in the bed or on a couch on your back, your stomach, your left side or your right side.  You may have one pillow under your head.  You may have pillows between your knees while you are on your side or under your knees while you are on your back.  2. DO NOT drive today.  Recline the seat as far back as it will go, while still wearing your seat belt, on the way home.  3. You may get up to go to the bathroom as needed.  You may sit up for 10 minutes to eat.  You may resume your normal diet and medications unless otherwise indicated.  Drink lots of extra fluids today and tomorrow.  4. The incidence of headache, nausea, or vomiting is about 5% (one in 20 patients).  If you develop a headache, lie flat and drink plenty of fluids until the headache goes away.  Caffeinated beverages may be helpful.  If you develop severe nausea and vomiting or a headache that does not go away with flat bed rest, call [phone].  5. You may resume normal activities after your 24 hours of bed rest is over; however, do not exert yourself strongly or do any heavy lifting tomorrow. If when you get up you have a headache when standing, go back to bed and force fluids for another 24 hours.  6. Call your physician for a follow-up appointment.  The results of your myelogram will be sent directly to your physician by the following day.  7. If you have any questions or if complications develop after you arrive home, please call [phone].  Discharge instructions have been explained to the patient.  The patient, or the person responsible for the patient, fully understands these instructions.  YOU MAY RESTART YOUR ZOLOFT AND GOODY POWDERS TOMORROW 03/05/2017 AT 1:30PM.

## 2017-03-10 DIAGNOSIS — R1909 Other intra-abdominal and pelvic swelling, mass and lump: Secondary | ICD-10-CM | POA: Diagnosis not present

## 2017-03-10 DIAGNOSIS — M48061 Spinal stenosis, lumbar region without neurogenic claudication: Secondary | ICD-10-CM | POA: Diagnosis not present

## 2017-03-12 ENCOUNTER — Other Ambulatory Visit: Payer: Self-pay | Admitting: Orthopedic Surgery

## 2017-03-12 DIAGNOSIS — R1909 Other intra-abdominal and pelvic swelling, mass and lump: Secondary | ICD-10-CM

## 2017-03-14 DIAGNOSIS — F341 Dysthymic disorder: Secondary | ICD-10-CM | POA: Diagnosis not present

## 2017-03-18 ENCOUNTER — Ambulatory Visit (INDEPENDENT_AMBULATORY_CARE_PROVIDER_SITE_OTHER): Payer: BLUE CROSS/BLUE SHIELD | Admitting: Internal Medicine

## 2017-03-18 ENCOUNTER — Encounter: Payer: Self-pay | Admitting: Internal Medicine

## 2017-03-18 ENCOUNTER — Other Ambulatory Visit (INDEPENDENT_AMBULATORY_CARE_PROVIDER_SITE_OTHER): Payer: BLUE CROSS/BLUE SHIELD

## 2017-03-18 ENCOUNTER — Ambulatory Visit (INDEPENDENT_AMBULATORY_CARE_PROVIDER_SITE_OTHER)
Admission: RE | Admit: 2017-03-18 | Discharge: 2017-03-18 | Disposition: A | Discharge status: Home / Self Care | Payer: BLUE CROSS/BLUE SHIELD | Source: Ambulatory Visit | Attending: Internal Medicine | Admitting: Internal Medicine

## 2017-03-18 VITALS — BP 114/72 | HR 87 | Ht 62.0 in | Wt 245.0 lb

## 2017-03-18 DIAGNOSIS — F17210 Nicotine dependence, cigarettes, uncomplicated: Secondary | ICD-10-CM

## 2017-03-18 DIAGNOSIS — D509 Iron deficiency anemia, unspecified: Secondary | ICD-10-CM | POA: Diagnosis not present

## 2017-03-18 DIAGNOSIS — R0609 Other forms of dyspnea: Secondary | ICD-10-CM | POA: Diagnosis not present

## 2017-03-18 DIAGNOSIS — J449 Chronic obstructive pulmonary disease, unspecified: Secondary | ICD-10-CM | POA: Diagnosis not present

## 2017-03-18 LAB — CBC WITH DIFFERENTIAL/PLATELET
BASOS ABS: 0.1 10*3/uL (ref 0.0–0.1)
Basophils Relative: 0.6 % (ref 0.0–3.0)
EOS PCT: 3.2 % (ref 0.0–5.0)
Eosinophils Absolute: 0.3 10*3/uL (ref 0.0–0.7)
HEMATOCRIT: 30.8 % — AB (ref 36.0–46.0)
HEMOGLOBIN: 9.4 g/dL — AB (ref 12.0–15.0)
LYMPHS ABS: 3.1 10*3/uL (ref 0.7–4.0)
LYMPHS PCT: 36.4 % (ref 12.0–46.0)
MCHC: 30.6 g/dL (ref 30.0–36.0)
MCV: 77.8 fl — AB (ref 78.0–100.0)
MONOS PCT: 9.1 % (ref 3.0–12.0)
Monocytes Absolute: 0.8 10*3/uL (ref 0.1–1.0)
Neutro Abs: 4.3 10*3/uL (ref 1.4–7.7)
Neutrophils Relative %: 50.7 % (ref 43.0–77.0)
Platelets: 291 10*3/uL (ref 150.0–400.0)
RBC: 3.96 Mil/uL (ref 3.87–5.11)
RDW: 20.9 % — ABNORMAL HIGH (ref 11.5–15.5)
WBC: 8.5 10*3/uL (ref 4.0–10.5)

## 2017-03-18 LAB — BRAIN NATRIURETIC PEPTIDE: Pro B Natriuretic peptide (BNP): 29 pg/mL (ref 0.0–100.0)

## 2017-03-18 LAB — BASIC METABOLIC PANEL
BUN: 13 mg/dL (ref 6–23)
CHLORIDE: 106 meq/L (ref 96–112)
CO2: 30 meq/L (ref 19–32)
Calcium: 8.4 mg/dL (ref 8.4–10.5)
Creatinine, Ser: 0.99 mg/dL (ref 0.40–1.20)
GFR: 60.58 mL/min (ref >60.00)
GLUCOSE: 94 mg/dL (ref 70–99)
POTASSIUM: 3.6 meq/L (ref 3.5–5.1)
Sodium: 141 mEq/L (ref 135–145)

## 2017-03-18 LAB — TSH: TSH: 4.37 u[IU]/mL (ref 0.35–4.50)

## 2017-03-18 MED ORDER — OMEPRAZOLE 20 MG PO CPDR: 40.0000 mg | DELAYED_RELEASE_CAPSULE | Freq: Every day | ORAL | 2 refills | Status: DC

## 2017-03-18 MED ORDER — RANITIDINE HCL 150 MG PO CAPS: 300.0000 mg | ORAL_CAPSULE | Freq: Every day | ORAL | 2 refills | Status: DC

## 2017-03-18 MED ORDER — TIOTROPIUM BROMIDE MONOHYDRATE 2.5 MCG/ACT IN AERS: 2.0000 | INHALATION_SPRAY | Freq: Every day | RESPIRATORY_TRACT | 11 refills | Status: DC

## 2017-03-18 MED ORDER — TIOTROPIUM BROMIDE MONOHYDRATE 1.25 MCG/ACT IN AERS: 2.0000 | INHALATION_SPRAY | Freq: Every day | RESPIRATORY_TRACT | 0 refills | Status: DC

## 2017-03-18 NOTE — Assessment & Plan Note (Addendum)
Spirometry 03/18/2017  FEV1 1.54 (66%)  Ratio 75 with min curvature p am symb - 03/18/2017   Walked RA x one lap @ 185 stopped due to  Sob nl pace, no desats - 03/18/2017  After extensive coaching inhaler device  effectiveness =    90% with smi > try adding spiriva respimat and return in 6 weeks for pfts off all rx     DDX of  difficult airways management almost all start with A and  include Adherence, Ace Inhibitors, Acid Reflux, Active Sinus Disease, Alpha 1 Antitripsin deficiency, Anxiety masquerading as Airways dz,  ABPA,  Allergy(esp in young), Aspiration (esp in elderly), Adverse effects of meds,  Active smokers, A bunch of PE's (a small clot burden can't cause this syndrome unless there is already severe underlying pulm or vascular dz with poor reserve) plus two Bs  = Bronchiectasis and Beta blocker use..and one C= CHF   Adherence is always the initial "prime suspect" and is a multilayered concern that requires a "trust but verify" approach in every patient - starting with knowing how to use medications, especially inhalers, correctly, keeping up with refills and understanding the fundamental difference between maintenance and prns vs those medications only taken for a very short course and then stopped and not refilled.  - see hfa teaching - return with all meds in hand using a trust but verify approach to confirm accurate Medication  Reconciliation The principal here is that until we are certain that the  patients are doing what we've asked, it makes no sense to ask them to do more.    Active smoking top of the list of usual suspects  ? Acid (or non-acid) GERD > always difficult to exclude as up to 75% of pts in some series report no assoc GI/ Heartburn symptoms> rec max (24h)  acid suppression and diet restrictions/ reviewed and instructions given in writing.   ? Allergy/ asthma component > doubt with out more cough now/ check profile but no change rx otherwise  ? Active sinus dz > low  threshold for ct sinus  ? Alpha one def > very unlikely with pfts so unimpressive at baseline   ? ? Anxiety/depression obesity/decondtioning  > usually at the bottom of this list of usual suspects but should be much higher on this pt's based on H and P and note already on psychotropics .   ? Adverse effects of meds > none listed   ? chf >  BNP nl excludes    Total time devoted to counseling  > 50 % of initial 60 min office visit:  review case with pt/ discussion of options/alternatives/ personally creating written customized instructions  in presence of pt  then going over those specific  Instructions directly with the pt including how to use all of the meds but in particular covering each new medication in detail and the difference between the maintenance= "automatic" meds and the prns using an action plan format for the latter (If this problem/symptom => do that organization reading Left to right).  Please see AVS from this visit for a full list of these instructions which I personally wrote for this pt and  are unique to this visit.

## 2017-03-18 NOTE — Patient Instructions (Addendum)
Change prilosec to 20 mg x 2 x 30 min before first meal of the day and zantac at bedime   GERD (REFLUX)  is an extremely common cause of respiratory symptoms just like yours , many times with no obvious heartburn at all.    It can be treated with medication, but also with lifestyle changes including elevation of the head of your bed (ideally with 6 inch  bed blocks),  Smoking cessation, avoidance of late meals, excessive alcohol, and avoid fatty foods, chocolate, peppermint, colas, red wine, and acidic juices such as orange juice.  NO MINT OR MENTHOL PRODUCTS SO NO COUGH DROPS  USE SUGARLESS CANDY INSTEAD (Jolley ranchers or Stover's or Life Savers) or even ice chips will also do - the key is to swallow to prevent all throat clearing. NO OIL BASED VITAMINS - use powdered substitutes.    Try adding spiriva 1.25 x 4 puff each am until use up sample and if it helps then start spiriva 2.5 x 2 pffs each am  Work on inhaler technique:  relax and gently blow all the way out then take a nice smooth deep breath back in, triggering the inhaler at same time you start breathing in.  Hold for up to 5 seconds if you can. Blow out thru nose. Rinse and gargle with water when done  Please remember to go to the lab and x-ray department downstairs in the basement  for your tests - we will call you with the results when they are available.     Please schedule a follow up office visit in 6 weeks, call sooner if needed with full pfts  - add rec iron profile for microcytic anemia

## 2017-03-18 NOTE — Progress Notes (Signed)
Subjective:     Patient ID: [name], female   DOB: 02/28/1955,    MRN: [id number]  HPI  36 yowf active smoker with h/o sinus problems in her 23s for which saw [facility] allergist on shots for ? How long didn't seem to help and tendency to worse more severe sinus problems through the years esp fall/winter and then started with sob around 2016/17 and started 02 at hs then got placed on symbicort late in 2018 and referred to pulmonary clinic 03/18/2017 by Dr   Scotty Court.    03/18/2017 1st [facility] office visit/ Wert  Sob since 2016 with min airflow on spirometry 03/18/2017  Chief Complaint  Patient presents with  . Pulmonary Consult    Referred by Suann Larry, NP. Pt states dxed with COPD 2 yrs ago and her breathing has been gradually worsening over the past 6 months. She states she gets SOB with doing housework, and also sometimes just at rest. She does not have a rescue inhaler. She has occ cough with grey sputum.    nasal congestion is better on flonase / symbicort >>  cough is also  some better but breathing is not  Doe across the room "sounds like a bear" = MMRC4  = sob if tries to leave home or while getting dressed   Sleeps flat on back on 2lpm wake up feeling fine / no am cough congestion Overt hb despite ppi daily   No obvious day to day or daytime variability or assoc truly  excess/ purulent sputum or mucus plugs or hemoptysis or cp or chest tightness . No unusual exposure hx or h/o childhood pna/ asthma or knowledge of premature birth.  Sleeping ok flat on 2lpm  without nocturnal  or early am exacerbation  of respiratory  c/o's or need for noct saba. Also denies any obvious fluctuation of symptoms with weather or environmental changes or other aggravating or alleviating factors except as outlined above   Current Allergies, Complete Past Medical History, Past Surgical History, Family History, and Social History were reviewed in [employer]  record.  ROS  The following are not active complaints unless bolded Hoarseness, sore throat, dysphagia, dental problems, itching, sneezing,  nasal congestion or discharge of excess mucus or purulent secretions, ear ache,   fever, chills, sweats, unintended wt loss or wt gain, classically pleuritic or exertional cp,  orthopnea pnd or leg swelling, presyncope, palpitations, abdominal pain, anorexia, nausea, vomiting, diarrhea  or change in bowel habits or change in bladder habits, change in stools or change in urine, dysuria, hematuria,  rash, arthralgias, visual complaints, headache, numbness, weakness or ataxia or problems with walking or coordination,  change in mood/affect or memory.        Current Meds  Medication Sig  . ALPRAZolam (XANAX) 1 MG tablet Take 1 tablet in the morning, 2 tablets in the afternoon, & 2 tablets at bedtime.  Marland Kitchen atorvastatin (LIPITOR) 20 MG tablet Take 20 mg by mouth at bedtime.   . budesonide-formoterol (SYMBICORT) 160-4.5 MCG/ACT inhaler Inhale 2 puffs into the lungs 2 (two) times daily.  . fluticasone (FLONASE) 50 MCG/ACT nasal spray Place 2 sprays into both nostrils daily.  Marland Kitchen gabapentin (NEURONTIN) 300 MG capsule Take 300 mg by mouth 3 (three) times daily.  Marland Kitchen HYDROcodone-acetaminophen (NORCO) 10-325 MG tablet Take 1-2 tablets by mouth every 4 (four) hours as needed.  Marland Kitchen levothyroxine (SYNTHROID, LEVOTHROID) 75 MCG tablet Take 75 mcg by mouth daily before breakfast.  . loratadine (  CLARITIN) 10 MG tablet Take 10 mg by mouth daily.  . meloxicam (MOBIC) 15 MG tablet Take 15 mg by mouth daily.  . methocarbamol (ROBAXIN) 500 MG tablet Take 1 tablet (500 mg total) by mouth every 6 (six) hours as needed for muscle spasms.  Marland Kitchen omeprazole (PRILOSEC) 20 MG capsule Take 20 mg by mouth daily before breakfast.   . ondansetron (ZOFRAN) 4 MG tablet Take 1 tablet (4 mg total) by mouth every 6 (six) hours as needed for nausea.  . OXYGEN Inhale 2 L into the lungs at bedtime.   .  potassium chloride (K-DUR) 10 MEQ tablet Take 10 mEq by mouth at bedtime.   . ranitidine (ZANTAC) 150 MG capsule Take 300 mg by mouth at bedtime.   . sertraline (ZOLOFT) 100 MG tablet Take 150 mg by mouth at bedtime.   Marland Kitchen tetrahydrozoline 0.05 % ophthalmic solution Place 1 drop into both eyes 3 (three) times daily as needed (for dry eyes.).  Marland Kitchen triamterene-hydrochlorothiazide (DYAZIDE) 37.5-25 MG per capsule Take 1 capsule by mouth daily.         Review of Systems     Objective:   Physical Exam Obese wf very heavy cig smoke smell  Wt Readings from Last 3 Encounters:  03/18/17 245 lb (111.1 kg)  09/02/16 245 lb 13 oz (111.5 kg)  08/29/16 228 lb 9.6 oz (103.7 kg)     Vital signs reviewed - Note on arrival 02 sats  95% on RA      HEENT: nl dentition, turbinates bilaterally, and oropharynx. Nl external ear canals without cough reflex   NECK :  without JVD/Nodes/TM/ nl carotid upstrokes bilaterally   LUNGS: no acc muscle use,  Nl contour chest with classic pseudowheeze resolves with plm    CV:  RRR  no s3 or murmur or increase in P2, and no edema   ABD:  Tensely obese / limited inspiratory excursion in the supine position. No bruits or organomegaly appreciated, bowel sounds nl  MS:  Nl gait/ ext warm without deformities, calf tenderness, cyanosis or clubbing No obvious joint restrictions   SKIN: warm and dry without lesions    NEURO:  alert, approp, nl sensorium with  no motor or cerebellar deficits apparent.      CXR PA and Lateral:   03/18/2017 :    I personally reviewed images and agree with radiology impression as follows:     Labs ordered/ reviewed:      Chemistry      Component Value Date/Time   NA 141 03/18/2017 1701   NA 144 06/30/2014 1448   K 3.6 03/18/2017 1701   K 3.6 06/30/2014 1448   CL 106 03/18/2017 1701   CO2 30 03/18/2017 1701   CO2 25 06/30/2014 1448   BUN 13 03/18/2017 1701   BUN 19.0 06/30/2014 1448   CREATININE 0.99 03/18/2017 1701    CREATININE 1.0 06/30/2014 1448      Component Value Date/Time   CALCIUM 8.4 03/18/2017 1701   CALCIUM 8.2 (L) 06/30/2014 1448   ALKPHOS 84 06/30/2014 1448   AST 16 06/30/2014 1448   ALT 7 06/30/2014 1448   BILITOT 0.24 06/30/2014 1448        Lab Results  Component Value Date   WBC 10.6 (H) 09/03/2016   HGB 8.4 (L) 09/03/2016   HCT 27.6 (L) 09/03/2016   MCV 84.4 09/03/2016   PLT 172 09/03/2016       Eos  0.3       Lab Results  Component Value Date   TSH 4.37 03/18/2017     Lab Results  Component Value Date   PROBNP 29.0 03/18/2017                     Assessment:

## 2017-03-19 ENCOUNTER — Encounter: Payer: Self-pay | Admitting: Internal Medicine

## 2017-03-19 ENCOUNTER — Telehealth: Payer: Self-pay | Admitting: Internal Medicine

## 2017-03-19 DIAGNOSIS — D649 Anemia, unspecified: Secondary | ICD-10-CM | POA: Insufficient documentation

## 2017-03-19 LAB — RESPIRATORY ALLERGY PROFILE REGION II ~~LOC~~
Allergen, D pternoyssinus,d7: <0.1 kU/L
Allergen, Mouse Urine Protein, e78: <0.1 kU/L
Allergen, Oak,t7: <0.1 kU/L
Allergen, P. notatum, m1: <0.1 kU/L
Box Elder IgE: <0.1 kU/L
CLASS: 0
CLASS: 0
CLASS: 0
CLASS: 0
CLASS: 0
CLASS: 0
CLASS: 0
CLASS: 0
CLASS: 0
CLASS: 0
CLASS: 0
CLASS: 0
Class: 0
Class: 0
Class: 0
Class: 0
Class: 0
Class: 0
Class: 0
Class: 0
Class: 0
Class: 0
Class: 0
Class: 0
Cockroach: <0.1 kU/L
D. farinae: <0.1 kU/L
Elm IgE: <0.1 kU/L
IGE (IMMUNOGLOBULIN E), SERUM: 4 kU/L (ref <=114)
Johnson Grass: <0.1 kU/L
Timothy Grass: <0.1 kU/L

## 2017-03-19 LAB — INTERPRETATION:

## 2017-03-19 NOTE — Telephone Encounter (Signed)
Called and spoke with patient, advised her of results. She states that she has an appointment to get a physical this coming week with her PCP. She will have her FE checked there. Nothing further needed at this time.

## 2017-03-19 NOTE — Assessment & Plan Note (Signed)
>   3 min discussion I reviewed the Fletcher curve with the patient that basically indicates  if you quit smoking when your best day FEV1 is still well preserved (as is clearly  the case here)  it is highly unlikely you will progress to severe disease and informed the patient there was  no medication on the market that has proven to alter the curve/ its downward trajectory  or the likelihood of progression of their disease(unlike other chronic medical conditions such as atheroclerosis where we do think we can change the natural hx with risk reducing meds)    Therefore stopping smoking and maintaining abstinence are  the most important aspects of her care, not choice of inhalers or for that matter, doctors.   Treatment other than smoking cessation  is entirely directed by severity of symptoms and focused also on reducing exacerbations, not attempting to change the natural history of the disease.

## 2017-03-19 NOTE — Progress Notes (Signed)
LMTCB

## 2017-03-19 NOTE — Assessment & Plan Note (Signed)
Body mass index is 44.81 kg/m.  -    Lab Results  Component Value Date   TSH 4.37 03/18/2017     Contributing to gerd risk/ doe/reviewed the need and the process to achieve and maintain neg calorie balance > defer f/u primary care including intermittently monitoring thyroid status

## 2017-03-19 NOTE — Assessment & Plan Note (Signed)
  Lab Results  Component Value Date   HGB 9.4 (L) 03/18/2017   HGB 8.4 (L) 09/03/2016   HGB 10.2 (L) 08/29/2016   HGB 11.6 06/30/2014   HGB 12.5 12/29/2013     Needs iron profile either here or per PCP

## 2017-03-19 NOTE — Assessment & Plan Note (Signed)
Probably combination of mild copd and obesity but note also has significant microcytic anemia (see separate a/p)

## 2017-03-19 NOTE — Progress Notes (Signed)
Already left msg tcb

## 2017-03-20 ENCOUNTER — Ambulatory Visit: Payer: BLUE CROSS/BLUE SHIELD | Admitting: Physical Therapy

## 2017-03-20 NOTE — Progress Notes (Signed)
Pt notified of labs

## 2017-03-25 ENCOUNTER — Inpatient Hospital Stay
Admission: RE | Admit: 2017-03-25 | Discharge: 2017-03-25 | Disposition: A | Discharge status: Home / Self Care | Payer: BLUE CROSS/BLUE SHIELD | Source: Ambulatory Visit | Attending: Orthopedic Surgery | Admitting: Orthopedic Surgery

## 2017-03-27 ENCOUNTER — Ambulatory Visit: Payer: BLUE CROSS/BLUE SHIELD | Admitting: Physical Therapy

## 2017-03-28 DIAGNOSIS — F341 Dysthymic disorder: Secondary | ICD-10-CM | POA: Diagnosis not present

## 2017-04-03 ENCOUNTER — Ambulatory Visit
Admission: RE | Admit: 2017-04-03 | Discharge: 2017-04-03 | Disposition: A | Discharge status: Home / Self Care | Payer: BLUE CROSS/BLUE SHIELD | Source: Ambulatory Visit | Attending: Orthopedic Surgery | Admitting: Orthopedic Surgery

## 2017-04-03 DIAGNOSIS — R1909 Other intra-abdominal and pelvic swelling, mass and lump: Secondary | ICD-10-CM

## 2017-04-03 DIAGNOSIS — K760 Fatty (change of) liver, not elsewhere classified: Secondary | ICD-10-CM | POA: Diagnosis not present

## 2017-04-03 MED ORDER — IOPAMIDOL (ISOVUE-300) INJECTION 61%
125.0000 mL | Freq: Once | INTRAVENOUS | Status: AC | PRN
  Administered 2017-04-03: 125 mL via INTRAVENOUS

## 2017-04-07 DIAGNOSIS — M48061 Spinal stenosis, lumbar region without neurogenic claudication: Secondary | ICD-10-CM | POA: Diagnosis not present

## 2017-04-29 ENCOUNTER — Ambulatory Visit: Payer: BLUE CROSS/BLUE SHIELD | Admitting: Internal Medicine

## 2017-05-06 DIAGNOSIS — Z1231 Encounter for screening mammogram for malignant neoplasm of breast: Secondary | ICD-10-CM | POA: Diagnosis not present

## 2017-05-06 DIAGNOSIS — Z780 Asymptomatic menopausal state: Secondary | ICD-10-CM | POA: Diagnosis not present

## 2017-05-06 DIAGNOSIS — Z5181 Encounter for therapeutic drug level monitoring: Secondary | ICD-10-CM | POA: Diagnosis not present

## 2017-05-06 DIAGNOSIS — E039 Hypothyroidism, unspecified: Secondary | ICD-10-CM | POA: Diagnosis not present

## 2017-05-06 DIAGNOSIS — Z13220 Encounter for screening for lipoid disorders: Secondary | ICD-10-CM | POA: Diagnosis not present

## 2017-05-06 DIAGNOSIS — Z6841 Body Mass Index (BMI) 40.0 and over, adult: Secondary | ICD-10-CM | POA: Diagnosis not present

## 2017-05-06 DIAGNOSIS — Z1321 Encounter for screening for nutritional disorder: Secondary | ICD-10-CM | POA: Diagnosis not present

## 2017-05-06 DIAGNOSIS — Z0000 Encounter for general adult medical examination without abnormal findings: Secondary | ICD-10-CM | POA: Diagnosis not present

## 2017-05-06 DIAGNOSIS — G894 Chronic pain syndrome: Secondary | ICD-10-CM | POA: Diagnosis not present

## 2017-05-06 DIAGNOSIS — F411 Generalized anxiety disorder: Secondary | ICD-10-CM | POA: Diagnosis not present

## 2017-05-06 LAB — LIPID PANEL
CHOLESTEROL: 130 (ref 0–200)
HDL: 28 — AB (ref 35–70)
LDL CALC: 64
TRIGLYCERIDES: 192 — AB (ref 40–160)

## 2017-05-07 ENCOUNTER — Ambulatory Visit: Payer: BLUE CROSS/BLUE SHIELD | Admitting: Internal Medicine

## 2017-05-12 DIAGNOSIS — F341 Dysthymic disorder: Secondary | ICD-10-CM | POA: Diagnosis not present

## 2017-05-26 ENCOUNTER — Ambulatory Visit (INDEPENDENT_AMBULATORY_CARE_PROVIDER_SITE_OTHER): Payer: BLUE CROSS/BLUE SHIELD | Admitting: Internal Medicine

## 2017-05-26 ENCOUNTER — Other Ambulatory Visit (INDEPENDENT_AMBULATORY_CARE_PROVIDER_SITE_OTHER): Payer: BLUE CROSS/BLUE SHIELD

## 2017-05-26 ENCOUNTER — Encounter: Payer: Self-pay | Admitting: Internal Medicine

## 2017-05-26 VITALS — BP 122/70 | HR 90 | Ht 64.0 in | Wt 252.0 lb

## 2017-05-26 DIAGNOSIS — J449 Chronic obstructive pulmonary disease, unspecified: Secondary | ICD-10-CM

## 2017-05-26 DIAGNOSIS — F17210 Nicotine dependence, cigarettes, uncomplicated: Secondary | ICD-10-CM

## 2017-05-26 DIAGNOSIS — D509 Iron deficiency anemia, unspecified: Secondary | ICD-10-CM | POA: Diagnosis not present

## 2017-05-26 LAB — PULMONARY FUNCTION TEST
DL/VA % pred: 63 %
DL/VA: 3.06 ml/min/mmHg/L
DLCO unc % pred: 55 %
DLCO unc: 13.35 ml/min/mmHg
FEF 25-75 POST: 1.06 L/s
FEF 25-75 Pre: 0.6 L/sec
FEF2575-%CHANGE-POST: 78 %
FEF2575-%PRED-PRE: 26 %
FEF2575-%Pred-Post: 46 %
FEV1-%CHANGE-POST: 14 %
FEV1-%Pred-Post: 63 %
FEV1-%Pred-Pre: 55 %
FEV1-PRE: 1.4 L
FEV1-Post: 1.6 L
FEV1FVC-%CHANGE-POST: 6 %
FEV1FVC-%PRED-PRE: 80 %
FEV6-%Change-Post: 9 %
FEV6-%PRED-PRE: 68 %
FEV6-%Pred-Post: 75 %
FEV6-Post: 2.38 L
FEV6-Pre: 2.17 L
FEV6FVC-%Change-Post: 2 %
FEV6FVC-%Pred-Post: 103 %
FEV6FVC-%Pred-Pre: 101 %
FVC-%CHANGE-POST: 7 %
FVC-%PRED-PRE: 68 %
FVC-%Pred-Post: 72 %
FVC-POST: 2.38 L
FVC-PRE: 2.23 L
POST FEV6/FVC RATIO: 100 %
PRE FEV1/FVC RATIO: 63 %
Post FEV1/FVC ratio: 67 %
Pre FEV6/FVC Ratio: 97 %
RV % pred: 141 %
RV: 2.84 L
TLC % pred: 105 %
TLC: 5.32 L

## 2017-05-26 LAB — CBC WITH DIFFERENTIAL/PLATELET
BASOS PCT: 1.6 % (ref 0.0–3.0)
Basophils Absolute: 0.2 10*3/uL — ABNORMAL HIGH (ref 0.0–0.1)
EOS PCT: 8.7 % — AB (ref 0.0–5.0)
Eosinophils Absolute: 0.9 10*3/uL — ABNORMAL HIGH (ref 0.0–0.7)
HEMATOCRIT: 30.4 % — AB (ref 36.0–46.0)
HEMOGLOBIN: 9.6 g/dL — AB (ref 12.0–15.0)
LYMPHS PCT: 27.1 % (ref 12.0–46.0)
Lymphs Abs: 2.8 10*3/uL (ref 0.7–4.0)
MCHC: 31.6 g/dL (ref 30.0–36.0)
MCV: 75.7 fl — ABNORMAL LOW (ref 78.0–100.0)
MONOS PCT: 7.7 % (ref 3.0–12.0)
Monocytes Absolute: 0.8 10*3/uL (ref 0.1–1.0)
Neutro Abs: 5.7 10*3/uL (ref 1.4–7.7)
Neutrophils Relative %: 54.9 % (ref 43.0–77.0)
Platelets: 242 10*3/uL (ref 150.0–400.0)
RBC: 4.01 Mil/uL (ref 3.87–5.11)
RDW: 21.8 % — AB (ref 11.5–15.5)
WBC: 10.3 10*3/uL (ref 4.0–10.5)

## 2017-05-26 LAB — IBC PANEL
Iron: 31 ug/dL — ABNORMAL LOW (ref 42–145)
Saturation Ratios: 6.8 % — ABNORMAL LOW (ref 20.0–50.0)
Transferrin: 324 mg/dL (ref 212.0–360.0)

## 2017-05-26 MED ORDER — ALBUTEROL SULFATE HFA 108 (90 BASE) MCG/ACT IN AERS: INHALATION_SPRAY | RESPIRATORY_TRACT | 1 refills | Status: DC

## 2017-05-26 NOTE — Patient Instructions (Addendum)
.  Although I don't endorse regular use of e cigs/ many pts find them helpful; however, I emphasized they should be considered a "one-way bridge" off all tobacco products.   Plan A = Automatic = symbicort and spiriva as you are    Plan B = Backup Only use your albuterol as a rescue medication to be used if you can't catch your breath by resting or doing a relaxed purse lip breathing pattern.  - The less you use it, the better it will work when you need it. - Ok to use the inhaler up to 2 puffs  every 4 hours if you must but call for appointment if use goes up over your usual need - Don't leave home without it !!  (think of it like the spare tire for your car)     Please remember to go to the lab department downstairs in the basement  for your tests - we will call you with the results when they are available.   Please schedule a follow up visit in 3 months but call sooner if needed  - add try off mobic due to ? Gi side effects to see if they get better or arthritis paint can't be controlled with just tylenol

## 2017-05-26 NOTE — Progress Notes (Signed)
Subjective:     Patient ID: [name], female   DOB: 1955-07-19,    MRN: [id number]      Brief patient profile: 39 yowf active smoker with h/o sinus problems in her 26s for which saw [facility] allergist on shots for ? How long didn't seem to help and tendency to worse more severe sinus problems through the years esp fall/winter and then started with sob around 2016/17 and started 02 at hs then got placed on symbicort late in 2018 and referred to pulmonary clinic 03/18/2017 by Dr   Scotty Court with documented GOLD II 05/26/2017 with min reversibility    History of Present Illness  03/18/2017 1st [facility] office visit/ [name]  Sob since 2016 with min airflow on spirometry 03/18/2017 on symb 160 Take 2 puffs first thing in am and then another 2 puffs about 12 hours later.       History of Present Illness  Chief Complaint  Patient presents with  . Pulmonary Consult    Referred by Suann Larry, NP. Pt states dxed with COPD 2 yrs ago and her breathing has been gradually worsening over the past 6 months. She states she gets SOB with doing housework, and also sometimes just at rest. She does not have a rescue inhaler. She has occ cough with grey sputum.    nasal congestion is better on flonase / symbicort >>  cough is also  some better but breathing is not  Doe across the room "sounds like a bear" = MMRC4  = sob if tries to leave home or while getting dressed   Sleeps flat on back on 2lpm wake up feeling fine / no am cough congestion Overt hb despite ppi daily  rec Change prilosec to 20 mg x 2 x 30 min before first meal of the day and zantac at bedime  GERD (REFLUX)  Diet  Try adding spiriva 1.25 x 4 puff each am until use up sample and if it helps then start spiriva 2.5 x 2 pffs each am Work on inhaler technique:   Please schedule a follow up office visit in 6 weeks, call sooner if needed with full pfts  - add rec iron profile for microcytic anemia     05/26/2017  f/u ov/[name] re:  Copd  gold II Chief Complaint  Patient presents with  . Follow-up    PFT's done today. Breathing is gradually worse and she is coughing more. Her cough is non prod and mainly bothers her at night.    Dyspnea:  80 ft across house and stops = MMRC3 = can't walk 100 yards even at a slow pace at a flat grade s stopping due to sob  Cough: non productive esp when head flat on one pillow SABA use: none  Sleeping   flat on 2lpm no am excess mucus   Says having overt hb despite ppi bid and h2 hs   No obvious day to day or daytime variability or assoc excess/ purulent sputum or mucus plugs or hemoptysis or cp or chest tightness, subjective wheeze or overt sinus  symptoms. No unusual exposure hx or h/o childhood pna/ asthma or knowledge of premature birth.    Also denies any obvious fluctuation of symptoms with weather or environmental changes or other aggravating or alleviating factors except as outlined above   Current Allergies, Complete Past Medical History, Past Surgical History, Family History, and Social History were reviewed in [employer] record.  ROS  The following are  not active complaints unless bolded Hoarseness, sore throat, dysphagia, dental problems, itching, sneezing,  nasal congestion or discharge of excess mucus or purulent secretions, ear ache,   fever, chills, sweats, unintended wt loss or wt gain, classically pleuritic or exertional cp,  orthopnea pnd or arm/hand swelling  or leg swelling, presyncope, palpitations, abdominal pain, anorexia, nausea, vomiting, diarrhea  or change in bowel habits or change in bladder habits, change in stools or change in urine, dysuria, hematuria,  rash, arthralgias, visual complaints, headache, numbness, weakness or ataxia or problems with walking or coordination,  change in mood or  memory.        Current Meds  Medication Sig  . ALPRAZolam (XANAX) 1 MG tablet 1 tablet three times daily as needed  . atorvastatin (LIPITOR) 20 MG  tablet Take 20 mg by mouth at bedtime.   . budesonide-formoterol (SYMBICORT) 160-4.5 MCG/ACT inhaler Inhale 2 puffs into the lungs 2 (two) times daily.  . fluticasone (FLONASE) 50 MCG/ACT nasal spray Place 2 sprays into both nostrils daily.  Marland Kitchen gabapentin (NEURONTIN) 300 MG capsule Take 300 mg by mouth 3 (three) times daily.  Marland Kitchen HYDROcodone-acetaminophen (NORCO) 10-325 MG tablet Take 1 tablet by mouth 3 (three) times daily as needed.  Marland Kitchen levothyroxine (SYNTHROID, LEVOTHROID) 75 MCG tablet Take 75 mcg by mouth daily before breakfast.  . loratadine (CLARITIN) 10 MG tablet Take 10 mg by mouth daily.  . meloxicam (MOBIC) 15 MG tablet Take 15 mg by mouth daily.  . methocarbamol (ROBAXIN) 500 MG tablet Take 1 tablet (500 mg total) by mouth every 6 (six) hours as needed for muscle spasms.  Marland Kitchen omeprazole (PRILOSEC) 20 MG capsule Take 2 capsules (40 mg total) by mouth daily before breakfast.  . ondansetron (ZOFRAN) 4 MG tablet Take 1 tablet (4 mg total) by mouth every 6 (six) hours as needed for nausea.  . OXYGEN Inhale 2 L into the lungs at bedtime.   . potassium chloride (K-DUR) 10 MEQ tablet Take 10 mEq by mouth at bedtime.   . ranitidine (ZANTAC) 150 MG capsule Take 2 capsules (300 mg total) by mouth at bedtime.  . sertraline (ZOLOFT) 100 MG tablet Take 150 mg by mouth at bedtime.   Marland Kitchen tetrahydrozoline 0.05 % ophthalmic solution Place 1 drop into both eyes 3 (three) times daily as needed (for dry eyes.).  Marland Kitchen Tiotropium Bromide Monohydrate (SPIRIVA RESPIMAT) 2.5 MCG/ACT AERS Inhale 2 puffs into the lungs daily.  Marland Kitchen triamterene-hydrochlorothiazide (DYAZIDE) 37.5-25 MG per capsule Take 1 capsule by mouth daily.   . Vitamin D, Ergocalciferol, (DRISDOL) 50000 units CAPS capsule Take 50,000 Units by mouth every 7 (seven) days.                              Objective:   Physical Exam  Chronically ill pale amb wf nad   05/26/2017       252   03/18/17 245 lb (111.1 kg)  09/02/16 245 lb 13  oz (111.5 kg)  08/29/16 228 lb 9.6 oz (103.7 kg)      Vital signs reviewed - Note on arrival 02 sats  98% on RA      HEENT: nl dentition, turbinates bilaterally, and oropharynx. Nl external ear canals without cough reflex   NECK :  without JVD/Nodes/TM/ nl carotid upstrokes bilaterally   LUNGS: no acc muscle use,  Nl contour chest with slightly distant bs bilaterally s wheeze and  without cough on insp or exp  maneuvers   CV:  RRR  no s3 or murmur or increase in P2, and no edema   ABD:  soft and nontender with nl inspiratory excursion in the supine position. No bruits or organomegaly appreciated, bowel sounds nl  MS:  Nl gait/ ext warm without deformities, calf tenderness, cyanosis or clubbing No obvious joint restrictions   SKIN: warm and dry without lesions    NEURO:  alert, approp, nl sensorium with  no motor or cerebellar deficits apparent.        CXR PA and Lateral:   03/18/2017 :    I personally reviewed images and agree with radiology impression as follows:   Compared to two-view chest radiographs on 10/27/2015 lung volumes and mediastinal contours are stable and within normal limits. Visualized tracheal air column is within normal limits. No pneumothorax, pleural effusion or confluent pulmonary opacity. Mild diffuse increased bilateral pulmonary interstitial markings are stable since 2017. No acute pulmonary opacity. No acute osseous abnormality identified. Negative visible bowel gas pattern. Stable cholecystectomy clips.       Labs ordered 05/26/2017  fe profile    Lab Results  Component Value Date   WBC 10.3 05/26/2017   HGB 9.6 (L) 05/26/2017   HCT 30.4 (L) 05/26/2017   MCV 75.7 (L) 05/26/2017   PLT 242.0 05/26/2017       EOS                                                               0.9                                    05/26/2017     Lab Results  Component Value Date   HGB 9.6 (L) 05/26/2017   HGB 9.4 (L) 03/18/2017   HGB 8.4 (L) 09/03/2016    HGB 11.6 06/30/2014   HGB 12.5 12/29/2013                 Assessment:

## 2017-05-26 NOTE — Progress Notes (Signed)
PFT done today. 

## 2017-05-27 ENCOUNTER — Encounter: Payer: Self-pay | Admitting: Gastroenterology

## 2017-05-27 ENCOUNTER — Telehealth: Payer: Self-pay | Admitting: *Deleted

## 2017-05-27 ENCOUNTER — Encounter: Payer: Self-pay | Admitting: Internal Medicine

## 2017-05-27 DIAGNOSIS — D509 Iron deficiency anemia, unspecified: Secondary | ICD-10-CM

## 2017-05-27 DIAGNOSIS — Z1231 Encounter for screening mammogram for malignant neoplasm of breast: Secondary | ICD-10-CM | POA: Diagnosis not present

## 2017-05-27 LAB — HM MAMMOGRAPHY

## 2017-05-27 NOTE — Assessment & Plan Note (Signed)
>   3 min Discussed the risks and costs (both direct and indirect)  of smoking relative to the benefits of quitting but patient unwilling to commit at this point to a specific quit date.    Although I don't endorse regular use of e cigs/ many pts find them helpful; however, I emphasized they should be considered a "one-way bridge" off all tobacco products.  

## 2017-05-27 NOTE — Telephone Encounter (Signed)
Spoke with the pt and notified of recs per MW  She verbalized understanding 

## 2017-05-27 NOTE — Assessment & Plan Note (Signed)
fe sat 05/26/2017 = 6.8  rec try off mobic and GI eval as this is the most likely source of chronic Fe loss

## 2017-05-27 NOTE — Telephone Encounter (Signed)
-----   Message from Tanda Rockers, MD sent at 05/27/2017  4:55 AM EDT -----  try off mobic due to ? Gi side effects to see if they get better or arthritis paint can't be controlled with just tylenol

## 2017-05-27 NOTE — Assessment & Plan Note (Signed)
Body mass index is 43.26 kg/m.  -  trending up still Lab Results  Component Value Date   TSH 4.37 03/18/2017     Contributing to gerd risk/ doe/reviewed the need and the process to achieve and maintain neg calorie balance > defer f/u primary care including intermittently monitoring thyroid status

## 2017-05-27 NOTE — Progress Notes (Signed)
Spoke with pt and notified of results per Dr. Melvyn Novas. Pt verbalized understanding and denied any questions. Referral to GI was made per pt req

## 2017-05-27 NOTE — Assessment & Plan Note (Signed)
Spirometry 03/18/2017  FEV1 1.54 (66%)  Ratio 75 with min curvature p am symb - 03/18/2017   Walked RA x one lap @ 185 stopped due to  Sob nl pace, no desats - 03/18/2017  After extensive coaching inhaler device  effectiveness =    90% with smi > try adding spiriva respimat and return in 6 weeks for pfts off all rx   - Allergy profile 03/18/17 >  Eos 0.3 /  IgE  4 neg RAST  - PFT's  05/26/2017  FEV1 1.60 (63 % ) ratio 67  p 14 % improvement from saba p sym/ spriva prior to study with DLCO  55 % corrects to 63  % for alv volume with EOS up to 0.8  Group D in terms of symptom/risk and laba/lama/ICS  therefore appropriate rx at this point. Given high eos should not try d/c ics at this point  Needs to be consistent with meds and learn how/ when to use hfa for rescue  see avs for instructions unique to this ov

## 2017-05-28 DIAGNOSIS — F341 Dysthymic disorder: Secondary | ICD-10-CM | POA: Diagnosis not present

## 2017-06-10 DIAGNOSIS — G894 Chronic pain syndrome: Secondary | ICD-10-CM | POA: Diagnosis not present

## 2017-06-10 DIAGNOSIS — F411 Generalized anxiety disorder: Secondary | ICD-10-CM | POA: Diagnosis not present

## 2017-06-10 DIAGNOSIS — Z6841 Body Mass Index (BMI) 40.0 and over, adult: Secondary | ICD-10-CM | POA: Diagnosis not present

## 2017-06-11 DIAGNOSIS — F341 Dysthymic disorder: Secondary | ICD-10-CM | POA: Diagnosis not present

## 2017-06-12 DIAGNOSIS — M85852 Other specified disorders of bone density and structure, left thigh: Secondary | ICD-10-CM | POA: Diagnosis not present

## 2017-06-12 DIAGNOSIS — Z780 Asymptomatic menopausal state: Secondary | ICD-10-CM | POA: Diagnosis not present

## 2017-06-12 LAB — HM DEXA SCAN

## 2017-07-07 DIAGNOSIS — M25562 Pain in left knee: Secondary | ICD-10-CM | POA: Diagnosis not present

## 2017-07-09 DIAGNOSIS — F341 Dysthymic disorder: Secondary | ICD-10-CM | POA: Diagnosis not present

## 2017-07-15 ENCOUNTER — Telehealth: Payer: Self-pay | Admitting: Internal Medicine

## 2017-07-15 ENCOUNTER — Ambulatory Visit (INDEPENDENT_AMBULATORY_CARE_PROVIDER_SITE_OTHER): Payer: BLUE CROSS/BLUE SHIELD | Admitting: Gastroenterology

## 2017-07-15 ENCOUNTER — Encounter: Payer: Self-pay | Admitting: Gastroenterology

## 2017-07-15 ENCOUNTER — Encounter (INDEPENDENT_AMBULATORY_CARE_PROVIDER_SITE_OTHER): Payer: Self-pay

## 2017-07-15 VITALS — BP 114/78 | HR 70 | Ht 63.0 in | Wt 255.2 lb

## 2017-07-15 DIAGNOSIS — D649 Anemia, unspecified: Secondary | ICD-10-CM

## 2017-07-15 MED ORDER — BUDESONIDE-FORMOTEROL FUMARATE 160-4.5 MCG/ACT IN AERO: 2.0000 | INHALATION_SPRAY | Freq: Two times a day (BID) | RESPIRATORY_TRACT | 0 refills | Status: DC

## 2017-07-15 MED ORDER — TIOTROPIUM BROMIDE MONOHYDRATE 2.5 MCG/ACT IN AERS: 2.0000 | INHALATION_SPRAY | Freq: Every day | RESPIRATORY_TRACT | 0 refills | Status: DC

## 2017-07-15 NOTE — Telephone Encounter (Signed)
Spoke with pt in the lobby. She is needing samples of Symbicort and Spiriva Respimat. Samples have been given to the pt. Nothing further was needed.

## 2017-07-15 NOTE — H&P (View-Only) (Signed)
Labs 04/2017: Hb 9.6, MCV 76, rdw 22, plts normal, iron 31  Epic labs: Hb 9-10.3 for at least the past 2-3 years.  Hb 2016 was 11.6   She had colonoscopy and EGD by Dr. Pleas Patricia in [place] 5-6 years ago.  Oxygen at night.  Still smokes.  Sometimes very dark stools, black.  Takes goody powders, usually 4 a day.  Pyrosis every morning, burns in her chest; takes 40mg  omeprazole every am, doesn't eat.  Also ranitidine 300mg  QHS.  SOB easily.   HPI: [name] who was referred to me Dr. Melvyn Novas for iron deficiency anemia  Chief complaint is iron deficiency anemia, GERD She was sent by her pulmonologist for iron deficiency anemia.  Reviewing her labs, see below, it looks like she has had anemia for at least 2 years or so.  She recalls having a colonoscopy and an upper endoscopy at least 6 years ago in [facility].  She is not sure why she had or what was found.  She does see mild rectal bleeding on the tissue paper only very rarely.  She sometimes has dark-colored stools.  She takes 4 Goody powders daily for a variety of orthopedic pains  She also has chronic GERD.  Currently she is taking Prilosec 40 mg in the morning and ranitidine 300 mg at bedtime.  She drinks a lot of caffeinated beverages in the afternoon hours.  She is morbidly obese.  She also does not eat after she takes her morning proton pump inhibitor.   Labs 04/2017: Hb 9.6, MCV 76, rdw 22, plts normal, iron 31  Epic labs: Hb 9-10.3 for at least the past 2-3 years.  Hb 2016 was 11.6  She wears oxygen nasal cannula at night and still smokes cigarettes  Pyrosis every morning, burns in her chest; takes 40mg  omeprazole every am, doesn't eat.  Also ranitidine 300mg  QHS.  SOB easily.   Review of systems: Pertinent positive and negative review of systems were noted in the above HPI section. All other review negative.   Past Medical History:  Diagnosis Date  . Allergic rhinitis   .  Anemia   . Anxiety   . Chronic back pain   . COPD (chronic obstructive pulmonary disease) ([place])   . Depression   . Essential hypertension   . GERD (gastroesophageal reflux disease)   . Headache    migraines  . History of gastritis    EGD 2015  . History of home oxygen therapy    2 liters at hs last 6 months  . Hypothyroidism   . Osteoarthritis    oa  . Scoliosis     Past Surgical History:  Procedure Laterality Date  . APPENDECTOMY     1985  . CARDIAC CATHETERIZATION N/A 10/31/2015   Procedure: Left Heart Cath and Coronary Angiography;  Surgeon: Leonie Man, MD;  Location: [facility];  Service: Cardiovascular;  Laterality: N/A;  . CARPAL TUNNEL RELEASE Left   . CARPAL TUNNEL RELEASE Right   . CHOLECYSTECTOMY  late 1980's  . HIP CLOSED REDUCTION Right 01/08/2016   Procedure: CLOSED MANIPULATION HIP;  Surgeon: Susa Day, MD;  Location: [facility];  Service: Orthopedics;  Laterality: Right;  . HIP CLOSED REDUCTION Right 01/19/2016   Procedure: ATTEMPTED CLOSED REDUCTION RIGHT HIP;  Surgeon: Wylene Simmer, MD;  Location: [facility];  Service: Orthopedics;  Laterality: Right;  . HIP CLOSED REDUCTION Right 01/20/2016   Procedure: CLOSED REDUCTION  RIGHT TOTAL HIP;  Surgeon: Paralee Cancel, MD;  Location: [facility];  Service: Orthopedics;  Laterality: Right;  . HIP CLOSED REDUCTION Right 02/17/2016   Procedure: CLOSED REDUCTION RIGHT TOTAL HIP;  Surgeon: Rod Can, MD;  Location: [facility];  Service: Orthopedics;  Laterality: Right;  . HIP CLOSED REDUCTION Right 02/28/2016   Procedure: CLOSED REDUCTION HIP;  Surgeon: Nicholes Stairs, MD;  Location: [facility];  Service: Orthopedics;  Laterality: Right;  . TONSILLECTOMY    . TOTAL ABDOMINAL HYSTERECTOMY     1985, with 1 ovary removed and 2 nd ovary removed 2003  . TOTAL HIP ARTHROPLASTY Right    Original surgery 2006 with revision 2010  . TOTAL HIP REVISION Right 01/01/2016   Procedure: TOTAL HIP REVISION;  Surgeon: Paralee Cancel, MD;   Location: [facility];  Service: Orthopedics;  Laterality: Right;  . TOTAL HIP REVISION Right 03/02/2016   Procedure: TOTAL HIP REVISION;  Surgeon: Paralee Cancel, MD;  Location: [facility];  Service: Orthopedics;  Laterality: Right;  . TOTAL HIP REVISION Right 09/02/2016   Procedure: Right hip constrained liner- posterior;  Surgeon: Paralee Cancel, MD;  Location: [facility];  Service: Orthopedics;  Laterality: Right;  . ULNAR NERVE TRANSPOSITION Right     Current Outpatient Medications  Medication Sig Dispense Refill  . albuterol (PROAIR HFA) 108 (90 Base) MCG/ACT inhaler 2 puffs every 4 hours as needed only  if your can't catch your breath 1 Inhaler 1  . ALPRAZolam (XANAX) 1 MG tablet 1 tablet three times daily as needed    . atorvastatin (LIPITOR) 20 MG tablet Take 20 mg by mouth at bedtime.     . budesonide-formoterol (SYMBICORT) 160-4.5 MCG/ACT inhaler Inhale 2 puffs into the lungs 2 (two) times daily.    . fluticasone (FLONASE) 50 MCG/ACT nasal spray Place 2 sprays into both nostrils daily.    Marland Kitchen gabapentin (NEURONTIN) 400 MG capsule Take 400 mg by mouth 3 (three) times daily.    Marland Kitchen HYDROcodone-acetaminophen (NORCO) 10-325 MG tablet Take 1 tablet by mouth 2 (two) times daily.     Marland Kitchen levothyroxine (SYNTHROID, LEVOTHROID) 75 MCG tablet Take 75 mcg by mouth daily before breakfast.    . loratadine (CLARITIN) 10 MG tablet Take 10 mg by mouth daily.    . methocarbamol (ROBAXIN) 500 MG tablet Take 1 tablet (500 mg total) by mouth every 6 (six) hours as needed for muscle spasms. 40 tablet 0  . omeprazole (PRILOSEC) 20 MG capsule Take 2 capsules (40 mg total) by mouth daily before breakfast. 60 capsule 2  . ondansetron (ZOFRAN) 4 MG tablet Take 1 tablet (4 mg total) by mouth every 6 (six) hours as needed for nausea. 20 tablet 0  . OXYGEN Inhale 2 L into the lungs at bedtime.     . potassium chloride (K-DUR) 10 MEQ tablet Take 10 mEq by mouth at bedtime.     . ranitidine (ZANTAC) 150 MG capsule Take 2 capsules (300  mg total) by mouth at bedtime. 60 capsule 2  . sertraline (ZOLOFT) 100 MG tablet Take 150 mg by mouth at bedtime.     . Tiotropium Bromide Monohydrate (SPIRIVA RESPIMAT) 2.5 MCG/ACT AERS Inhale 2 puffs into the lungs daily. 1 Inhaler 11  . triamterene-hydrochlorothiazide (DYAZIDE) 37.5-25 MG per capsule Take 1 capsule by mouth daily.      No current facility-administered medications for this visit.     Allergies as of 07/15/2017 - Review Complete 07/15/2017  Allergen Reaction Noted  . Aleve [naproxen sodium]  Other (See Comments) 10/26/2015  . Codeine Nausea Only   . Penicillins Nausea Only   . Sulfonamide derivatives Hives     Family History  Problem Relation Age of Onset  . COPD Mother   . Lung disease Father        Asbestosis  . Heart attack Father   . Cerebral aneurysm Brother     Social History   Socioeconomic History  . Marital status: Married    Spouse name: Not on file  . Number of children: Not on file  . Years of education: Not on file  . Highest education level: Not on file  Occupational History  . Not on file  Social Needs  . Financial resource strain: Not on file  . Food insecurity:    Worry: Not on file    Inability: Not on file  . Transportation needs:    Medical: Not on file    Non-medical: Not on file  Tobacco Use  . Smoking status: Current Every Day Smoker    Packs/day: 1.50    Years: 46.00    Pack years: 69.00    Types: Cigarettes  . Smokeless tobacco: Never Used  Substance and Sexual Activity  . Alcohol use: No  . Drug use: No  . Sexual activity: Not on file  Lifestyle  . Physical activity:    Days per week: Not on file    Minutes per session: Not on file  . Stress: Not on file  Relationships  . Social connections:    Talks on phone: Not on file    Gets together: Not on file    Attends religious service: Not on file    Active member of club or organization: Not on file    Attends meetings of clubs or organizations: Not on file     Relationship status: Not on file  . Intimate partner violence:    Fear of current or ex partner: Not on file    Emotionally abused: Not on file    Physically abused: Not on file    Forced sexual activity: Not on file  Other Topics Concern  . Not on file  Social History Narrative  . Not on file     Physical Exam: BP 114/78   Pulse 70   Ht 5\' 3"  (1.6 m)   Wt 255 lb 4 oz (115.8 kg)   BMI 45.22 kg/m  Constitutional: generally well-appearing Psychiatric: alert and oriented x3 Eyes: extraocular movements intact Mouth: oral pharynx moist, no lesions Neck: supple no lymphadenopathy Cardiovascular: heart regular rate and rhythm Lungs: clear to auscultation bilaterally Abdomen: soft, nontender, nondistended, no obvious ascites, no peritoneal signs, normal bowel sounds Extremities: no lower extremity edema bilaterally Skin: no lesions on visible extremities   Assessment and plan: 62 y.o. female with morbid obesity, severe COPD, still smokes, iron deficiency anemia, chronic GERD  For her iron deficiency anemia I recommended colonoscopy and upper endoscopy.  She takes a lot of NSAIDs on a daily basis in the form of Goody powders and so she certainly might have GI irritation from that.  These procedures will be safest to be done at the hospital given her significant comorbid conditions.  She understands that losing weight might help her chronic GERD.  Drinking less caffeine would probably help as well.  I adjusted her proton pump inhibitor so that she is taking it correctly in relation to meals and I have given her a new prescription for it as well.  Please see the "Patient Instructions" section for addition details about the plan.   Owens Loffler, MD [facility] 07/15/2017, 11:37 AM  Cc: Deloria Lair., MD

## 2017-07-15 NOTE — Progress Notes (Signed)
Labs 04/2017: Hb 9.6, MCV 76, rdw 22, plts normal, iron 31  Epic labs: Hb 9-10.3 for at least the past 2-3 years.  Hb 2016 was 11.6   She had colonoscopy and EGD by Dr. Pleas Patricia in [place] 5-6 years ago.  Oxygen at night.  Still smokes.  Sometimes very dark stools, black.  Takes goody powders, usually 4 a [name].  Pyrosis every morning, burns in her chest; takes 40mg  omeprazole every am, doesn't eat.  Also ranitidine 300mg  QHS.  SOB easily.   HPI: [name] who was referred to me Dr. Melvyn [name] for iron deficiency anemia  Chief complaint is iron deficiency anemia, GERD She was sent by her pulmonologist for iron deficiency anemia.  Reviewing her labs, see below, it looks like she has had anemia for at least 2 years or so.  She recalls having a colonoscopy and an upper endoscopy at least 6 years ago in [facility].  She is not sure why she had or what was found.  She does see mild rectal bleeding on the tissue paper only very rarely.  She sometimes has dark-colored stools.  She takes 4 Goody powders daily for a variety of orthopedic pains  She also has chronic GERD.  Currently she is taking Prilosec 40 mg in the morning and ranitidine 300 mg at bedtime.  She drinks a lot of caffeinated beverages in the afternoon hours.  She is morbidly obese.  She also does not eat after she takes her morning proton pump inhibitor.   Labs 04/2017: Hb 9.6, MCV 76, rdw 22, plts normal, iron 31  Epic labs: Hb 9-10.3 for at least the past 2-3 years.  Hb 2016 was 11.6  She wears oxygen nasal cannula at night and still smokes cigarettes  Pyrosis every morning, burns in her chest; takes 40mg  omeprazole every am, doesn't eat.  Also ranitidine 300mg  QHS.  SOB easily.   Review of systems: Pertinent positive and negative review of systems were noted in the above HPI section. All other review negative.   Past Medical History:  Diagnosis Date  . Allergic rhinitis   .  Anemia   . Anxiety   . Chronic back pain   . COPD (chronic obstructive pulmonary disease) ([place])   . Depression   . Essential hypertension   . GERD (gastroesophageal reflux disease)   . Headache    migraines  . History of gastritis    EGD 2015  . History of home oxygen therapy    2 liters at hs last 6 months  . Hypothyroidism   . Osteoarthritis    oa  . Scoliosis     Past Surgical History:  Procedure Laterality Date  . APPENDECTOMY     1985  . CARDIAC CATHETERIZATION N/A 10/31/2015   Procedure: Left Heart Cath and Coronary Angiography;  Surgeon: Kendra Man, MD;  Location: [facility];  Service: Cardiovascular;  Laterality: N/A;  . CARPAL TUNNEL RELEASE Left   . CARPAL TUNNEL RELEASE Right   . CHOLECYSTECTOMY  late 1980's  . HIP CLOSED REDUCTION Right 01/08/2016   Procedure: CLOSED MANIPULATION HIP;  Surgeon: Kendra Day, MD;  Location: [facility];  Service: Orthopedics;  Laterality: Right;  . HIP CLOSED REDUCTION Right 01/19/2016   Procedure: ATTEMPTED CLOSED REDUCTION RIGHT HIP;  Surgeon: Kendra Simmer, MD;  Location: [facility];  Service: Orthopedics;  Laterality: Right;  . HIP CLOSED REDUCTION Right 01/20/2016   Procedure: CLOSED REDUCTION  RIGHT TOTAL HIP;  Surgeon: Kendra Cancel, MD;  Location: [facility];  Service: Orthopedics;  Laterality: Right;  . HIP CLOSED REDUCTION Right 02/17/2016   Procedure: CLOSED REDUCTION RIGHT TOTAL HIP;  Surgeon: Kendra Can, MD;  Location: [facility];  Service: Orthopedics;  Laterality: Right;  . HIP CLOSED REDUCTION Right 02/28/2016   Procedure: CLOSED REDUCTION HIP;  Surgeon: Kendra Stairs, MD;  Location: [facility];  Service: Orthopedics;  Laterality: Right;  . TONSILLECTOMY    . TOTAL ABDOMINAL HYSTERECTOMY     1985, with 1 ovary removed and 2 nd ovary removed 2003  . TOTAL HIP ARTHROPLASTY Right    Original surgery 2006 with revision 2010  . TOTAL HIP REVISION Right 01/01/2016   Procedure: TOTAL HIP REVISION;  Surgeon: Kendra Cancel, MD;   Location: [facility];  Service: Orthopedics;  Laterality: Right;  . TOTAL HIP REVISION Right 03/02/2016   Procedure: TOTAL HIP REVISION;  Surgeon: Kendra Cancel, MD;  Location: [facility];  Service: Orthopedics;  Laterality: Right;  . TOTAL HIP REVISION Right 09/02/2016   Procedure: Right hip constrained liner- posterior;  Surgeon: Kendra Cancel, MD;  Location: [facility];  Service: Orthopedics;  Laterality: Right;  . ULNAR NERVE TRANSPOSITION Right     Current Outpatient Medications  Medication Sig Dispense Refill  . albuterol (PROAIR HFA) 108 (90 Base) MCG/ACT inhaler 2 puffs every 4 hours as needed only  if your [name]'t catch your breath 1 Inhaler 1  . ALPRAZolam (XANAX) 1 MG tablet 1 tablet three times daily as needed    . atorvastatin (LIPITOR) 20 MG tablet Take 20 mg by mouth at bedtime.     . budesonide-formoterol (SYMBICORT) 160-4.5 MCG/ACT inhaler Inhale 2 puffs into the lungs 2 (two) times daily.    . fluticasone (FLONASE) 50 MCG/ACT nasal spray Place 2 sprays into both nostrils daily.    Marland Kitchen gabapentin (NEURONTIN) 400 MG capsule Take 400 mg by mouth 3 (three) times daily.    Marland Kitchen HYDROcodone-acetaminophen (NORCO) 10-325 MG tablet Take 1 tablet by mouth 2 (two) times daily.     Marland Kitchen levothyroxine (SYNTHROID, LEVOTHROID) 75 MCG tablet Take 75 mcg by mouth daily before breakfast.    . loratadine (CLARITIN) 10 MG tablet Take 10 mg by mouth daily.    . methocarbamol (ROBAXIN) 500 MG tablet Take 1 tablet (500 mg total) by mouth every 6 (six) hours as needed for muscle spasms. 40 tablet 0  . omeprazole (PRILOSEC) 20 MG capsule Take 2 capsules (40 mg total) by mouth daily before breakfast. 60 capsule 2  . ondansetron (ZOFRAN) 4 MG tablet Take 1 tablet (4 mg total) by mouth every 6 (six) hours as needed for nausea. 20 tablet 0  . OXYGEN Inhale 2 L into the lungs at bedtime.     . potassium chloride (K-DUR) 10 MEQ tablet Take 10 mEq by mouth at bedtime.     . ranitidine (ZANTAC) 150 MG capsule Take 2 capsules (300  mg total) by mouth at bedtime. 60 capsule 2  . sertraline (ZOLOFT) 100 MG tablet Take 150 mg by mouth at bedtime.     . Tiotropium Bromide Monohydrate (SPIRIVA RESPIMAT) 2.5 MCG/ACT AERS Inhale 2 puffs into the lungs daily. 1 Inhaler 11  . triamterene-hydrochlorothiazide (DYAZIDE) 37.5-25 MG per capsule Take 1 capsule by mouth daily.      No current facility-administered medications for this visit.     Allergies as of 07/15/2017 - Review Complete 07/15/2017  Allergen Reaction Noted  . Aleve [naproxen sodium]  Other (See Comments) 10/26/2015  . Codeine Nausea Only   . Penicillins Nausea Only   . Sulfonamide derivatives Hives     Family History  Problem Relation Age of Onset  . COPD Mother   . Lung disease Father        Asbestosis  . Heart attack Father   . Cerebral aneurysm Brother     Social History   Socioeconomic History  . Marital status: Married    Spouse name: Not on file  . Number of children: Not on file  . Years of education: Not on file  . Highest education level: Not on file  Occupational History  . Not on file  Social Needs  . Financial resource strain: Not on file  . Food insecurity:    Worry: Not on file    Inability: Not on file  . Transportation needs:    Medical: Not on file    Non-medical: Not on file  Tobacco Use  . Smoking status: Current Every [name] Smoker    Packs/[name]: 1.50    Years: 46.00    Pack years: 69.00    Types: Cigarettes  . Smokeless tobacco: Never Used  Substance and Sexual Activity  . Alcohol use: No  . Drug use: No  . Sexual activity: Not on file  Lifestyle  . Physical activity:    Days per week: Not on file    Minutes per session: Not on file  . Stress: Not on file  Relationships  . Social connections:    Talks on phone: Not on file    Gets together: Not on file    Attends religious service: Not on file    Active member of club or organization: Not on file    Attends meetings of clubs or organizations: Not on file     Relationship status: Not on file  . Intimate partner violence:    Fear of current or ex partner: Not on file    Emotionally abused: Not on file    Physically abused: Not on file    Forced sexual activity: Not on file  Other Topics Concern  . Not on file  Social History Narrative  . Not on file     Physical Exam: BP 114/78   Pulse 70   Ht 5\' 3"  (1.6 m)   Wt 255 lb 4 oz (115.8 kg)   BMI 45.22 kg/m  Constitutional: generally well-appearing Psychiatric: alert and oriented x3 Eyes: extraocular movements intact Mouth: oral pharynx moist, no lesions Neck: supple no lymphadenopathy Cardiovascular: heart regular rate and rhythm Lungs: clear to auscultation bilaterally Abdomen: soft, nontender, nondistended, no obvious ascites, no peritoneal signs, normal bowel sounds Extremities: no lower extremity edema bilaterally Skin: no lesions on visible extremities   Assessment and plan: 62 y.o. female with morbid obesity, severe COPD, still smokes, iron deficiency anemia, chronic GERD  For her iron deficiency anemia I recommended colonoscopy and upper endoscopy.  She takes a lot of NSAIDs on a daily basis in the form of Goody powders and so she certainly might have GI irritation from that.  These procedures will be safest to be done at the hospital given her significant comorbid conditions.  She understands that losing weight might help her chronic GERD.  Drinking less caffeine would probably help as well.  I adjusted her proton pump inhibitor so that she is taking it correctly in relation to meals and I have given her a new prescription for it as well.  Please see the "Patient Instructions" section for addition details about the plan.   Kendra Loffler, MD [facility] 07/15/2017, 11:37 AM  Cc: [name]., MD

## 2017-07-15 NOTE — Patient Instructions (Addendum)
You will be set up for a colonoscopy and EGD for iron def anemia at [facility] with MAC sedation. Less caffeine intake overall.  Caffeine will worsen your GERD symptoms Losing weight will help your GERD symptoms. New script for prilosec 10m one pill BID before BF and dinner meals. Disp one month with 11 refills.  Normal BMI (Body Mass Index- based on height and weight) is between 19 and 25. Your BMI today is Body mass index is 45.22 kg/m. .Marland KitchenPlease consider follow up  regarding your BMI with your Primary Care Provider.

## 2017-07-16 ENCOUNTER — Telehealth: Payer: Self-pay | Admitting: Gastroenterology

## 2017-07-17 ENCOUNTER — Other Ambulatory Visit: Payer: Self-pay

## 2017-07-17 ENCOUNTER — Telehealth: Payer: Self-pay

## 2017-07-17 MED ORDER — OMEPRAZOLE 40 MG PO CPDR: 40.0000 mg | DELAYED_RELEASE_CAPSULE | Freq: Two times a day (BID) | ORAL | 11 refills | Status: DC

## 2017-07-17 MED ORDER — ONDANSETRON HCL 4 MG PO TABS: ORAL_TABLET | ORAL | 0 refills | Status: DC

## 2017-07-17 MED ORDER — PEG 3350-KCL-NA BICARB-NACL 420 G PO SOLR: 4000.0000 mL | ORAL | 0 refills | Status: DC

## 2017-07-17 NOTE — Telephone Encounter (Signed)
Patient informed that Zofran has been sent to her pharmacy

## 2017-07-17 NOTE — Telephone Encounter (Signed)
Medication hs been sent to [facility]. Patient notified.

## 2017-07-17 NOTE — Telephone Encounter (Signed)
-----   Message from Milus Banister, MD sent at 07/17/2017  9:25 AM EDT ----- Yes, thanks.    zofran 4mg  pill, one pill 1 hour prior to both 'doses' of her prep.  Disp 2 pills, no refills   ----- Message ----- From: Angie Fava, LPN Sent: 09/21/1751   9:18 AM To: Milus Banister, MD  Dr Ardis Hughs, this patient is requesting Zofran prior to taking her colon prep. She is scheduled at [facility] on 07/24/17.

## 2017-07-17 NOTE — Addendum Note (Signed)
Addended by: Grace Bushy A on: 07/17/2017 08:56 AM   Modules accepted: Orders

## 2017-07-18 ENCOUNTER — Ambulatory Visit (INDEPENDENT_AMBULATORY_CARE_PROVIDER_SITE_OTHER): Payer: BLUE CROSS/BLUE SHIELD | Admitting: Physician Assistant

## 2017-07-18 ENCOUNTER — Other Ambulatory Visit: Payer: Self-pay

## 2017-07-18 ENCOUNTER — Encounter: Payer: Self-pay | Admitting: Physician Assistant

## 2017-07-18 VITALS — BP 116/72 | HR 102 | Temp 98.5°F | Resp 15 | Ht 62.25 in | Wt 254.8 lb

## 2017-07-18 DIAGNOSIS — F329 Major depressive disorder, single episode, unspecified: Secondary | ICD-10-CM | POA: Diagnosis not present

## 2017-07-18 DIAGNOSIS — D509 Iron deficiency anemia, unspecified: Secondary | ICD-10-CM

## 2017-07-18 DIAGNOSIS — L7211 Pilar cyst: Secondary | ICD-10-CM

## 2017-07-18 DIAGNOSIS — J449 Chronic obstructive pulmonary disease, unspecified: Secondary | ICD-10-CM | POA: Diagnosis not present

## 2017-07-18 DIAGNOSIS — E785 Hyperlipidemia, unspecified: Secondary | ICD-10-CM | POA: Diagnosis not present

## 2017-07-18 DIAGNOSIS — F419 Anxiety disorder, unspecified: Secondary | ICD-10-CM

## 2017-07-18 DIAGNOSIS — E039 Hypothyroidism, unspecified: Secondary | ICD-10-CM | POA: Diagnosis not present

## 2017-07-18 DIAGNOSIS — I10 Essential (primary) hypertension: Secondary | ICD-10-CM | POA: Diagnosis not present

## 2017-07-18 MED ORDER — TRIAMTERENE-HCTZ 37.5-25 MG PO CAPS: 1.0000 | ORAL_CAPSULE | Freq: Every day | ORAL | 1 refills | Status: DC

## 2017-07-18 MED ORDER — HYDROCODONE-ACETAMINOPHEN 10-325 MG PO TABS: 1.0000 | ORAL_TABLET | Freq: Three times a day (TID) | ORAL | 0 refills | Status: DC

## 2017-07-18 MED ORDER — SERTRALINE HCL 100 MG PO TABS: 200.0000 mg | ORAL_TABLET | Freq: Every day | ORAL | 3 refills | Status: DC

## 2017-07-18 NOTE — Patient Instructions (Signed)
I have refilled needed medications. I am giving a one month prescription of your pain medication while we are getting you in to a specialist. Further fills will have to come from the pain specialist or your treating orthopedic providers.   I am increasing Sertraline to 200 mg daily.   Take the antibiotic as directed. Follow-up with me in 1 month for reassessment of mood.   Welcome to [facility]!

## 2017-07-18 NOTE — Progress Notes (Signed)
Patient presents to clinic today to establish care.  Acute Concerns: Patient notes a painful bump on her posterior scalp x couple of days. Denies drainage or warmth at site. Denies trauma or injury. Patient also denies fever, chills or malaise.   Chronic Issues: Iron Deficiency Anemia -- Followed by Gastroenterology (Dr. Ardis Hughs). Is currently scheduled for colonoscopy and EGD for further assessment.    COPD -- Followed by [facility] (Dr. Melvyn Novas). Has follow-up every 3 months. Is currently on a regimen of Symbicort 160-4.5 mcg/act - 2 puffs BID, Tiotropium and Albuterol (PRN). O2 at 2L/min at night only. Currently a smoker -- Is working on this. Was on 2 ppd but is now down to 1 ppd.   Hypertension -- Patient is currently on a regimen of Dyazide. Is taking as directed with good control of BP. Patient denies chest pain, palpitations, lightheadedness, dizziness, vision changes or frequent headaches.  BP Readings from Last 3 Encounters:  07/18/17 116/72  07/15/17 114/78  05/26/17 122/70   Hyperlipidemia -- Patient is currently on a regimen of Atorvastatin 20 mg. Endorses taking daily as directed.   Hypothyroidism -- Currently on a regimen of levothyroxine 75 mcg. Is taking daily as directed.   Anxiety/Depression -- Patient taking Sertraline 150 mg daily. Notes that she has previously done very well on this regimen but recently has noted breakthrough symptoms. Notes some deterioration in mood and feeling more tearful. Denies SI/HI.   Health Maintenance: Immunizations -- Will need records.  Colonoscopy -- Patient endorses up-to-date.  Mammogram -- Due for repeat mammogram. Denies history of abnormal mammogram. PAP -- s/p hysterectomy.  Past Medical History:  Diagnosis Date  . Allergic rhinitis   . Anemia   . Anxiety   . Chicken pox   . Chronic back pain   . COPD (chronic obstructive pulmonary disease) ([place])   . Depression   . Essential hypertension   . GERD (gastroesophageal  reflux disease)   . Headache    migraines  . History of gastritis    EGD 2015  . History of home oxygen therapy    2 liters at hs last 6 months  . Hyperlipidemia   . Hypothyroidism   . Migraines   . Osteoarthritis    oa  . Scoliosis     Past Surgical History:  Procedure Laterality Date  . APPENDECTOMY     1985  . CARDIAC CATHETERIZATION N/A 10/31/2015   Procedure: Left Heart Cath and Coronary Angiography;  Surgeon: Leonie Man, MD;  Location: [facility];  Service: Cardiovascular;  Laterality: N/A;  . CARPAL TUNNEL RELEASE Left   . CARPAL TUNNEL RELEASE Right   . CHOLECYSTECTOMY  late 1980's  . GALLBLADDER SURGERY  1991  . HIP CLOSED REDUCTION Right 01/08/2016   Procedure: CLOSED MANIPULATION HIP;  Surgeon: Susa Day, MD;  Location: [facility];  Service: Orthopedics;  Laterality: Right;  . HIP CLOSED REDUCTION Right 01/19/2016   Procedure: ATTEMPTED CLOSED REDUCTION RIGHT HIP;  Surgeon: Wylene Simmer, MD;  Location: [facility];  Service: Orthopedics;  Laterality: Right;  . HIP CLOSED REDUCTION Right 01/20/2016   Procedure: CLOSED REDUCTION RIGHT TOTAL HIP;  Surgeon: Paralee Cancel, MD;  Location: [facility];  Service: Orthopedics;  Laterality: Right;  . HIP CLOSED REDUCTION Right 02/17/2016   Procedure: CLOSED REDUCTION RIGHT TOTAL HIP;  Surgeon: Rod Can, MD;  Location: [facility];  Service: Orthopedics;  Laterality: Right;  . HIP CLOSED REDUCTION Right 02/28/2016   Procedure: CLOSED REDUCTION HIP;  Surgeon: Nicholes Stairs, MD;  Location: [facility];  Service: Orthopedics;  Laterality: Right;  . TONSILLECTOMY    . TOTAL ABDOMINAL HYSTERECTOMY     1985, with 1 ovary removed and 2 nd ovary removed 2003  . TOTAL HIP ARTHROPLASTY Right    Original surgery 2006 with revision 2010  . TOTAL HIP REVISION Right 01/01/2016   Procedure: TOTAL HIP REVISION;  Surgeon: Paralee Cancel, MD;  Location: [facility];  Service: Orthopedics;  Laterality: Right;  . TOTAL HIP REVISION Right 03/02/2016    Procedure: TOTAL HIP REVISION;  Surgeon: Paralee Cancel, MD;  Location: [facility];  Service: Orthopedics;  Laterality: Right;  . TOTAL HIP REVISION Right 09/02/2016   Procedure: Right hip constrained liner- posterior;  Surgeon: Paralee Cancel, MD;  Location: [facility];  Service: Orthopedics;  Laterality: Right;  . ULNAR NERVE TRANSPOSITION Right     Current Outpatient Medications on File Prior to Visit  Medication Sig Dispense Refill  . albuterol (PROAIR HFA) 108 (90 Base) MCG/ACT inhaler 2 puffs every 4 hours as needed only  if your can't catch your breath (Patient taking differently: Inhale 2 puffs into the lungs every 4 (four) hours as needed for wheezing or shortness of breath. ) 1 Inhaler 1  . atorvastatin (LIPITOR) 20 MG tablet Take 20 mg by mouth at bedtime.     . budesonide-formoterol (SYMBICORT) 160-4.5 MCG/ACT inhaler Inhale 2 puffs into the lungs 2 (two) times daily. 2 Inhaler 0  . fluticasone (FLONASE) 50 MCG/ACT nasal spray Place 2 sprays into both nostrils daily.    Marland Kitchen gabapentin (NEURONTIN) 300 MG capsule Take 600 mg by mouth 3 times daily    . loratadine (CLARITIN) 10 MG tablet Take 10 mg by mouth daily.    . methocarbamol (ROBAXIN) 500 MG tablet Take 1 tablet (500 mg total) by mouth every 6 (six) hours as needed for muscle spasms. (Patient not taking: Reported on 07/21/2017) 40 tablet 0  . omeprazole (PRILOSEC) 40 MG capsule Take 1 capsule (40 mg total) by mouth 2 (two) times daily. Before breakfast and dinner 30 capsule 11  . ondansetron (ZOFRAN) 4 MG tablet Take 1 pill 1 hour prior to both doses of colon prep (Patient taking differently: Take 4 mg by mouth daily as needed for nausea or vomiting. ) 2 tablet 0  . OXYGEN Inhale 2 L into the lungs at bedtime.     . potassium chloride (K-DUR) 10 MEQ tablet Take 10 mEq by mouth at bedtime.     . ranitidine (ZANTAC) 150 MG capsule Take 2 capsules (300 mg total) by mouth at bedtime. 60 capsule 2  . Tiotropium Bromide Monohydrate (SPIRIVA RESPIMAT)  2.5 MCG/ACT AERS Inhale 2 puffs into the lungs daily. 2 Inhaler 0  . polyethylene glycol-electrolytes (NULYTELY/GOLYTELY) 420 g solution Take 4,000 mLs by mouth as directed. 4000 mL 0   No current facility-administered medications on file prior to visit.     Allergies  Allergen Reactions  . Nsaids Diarrhea  . Aleve [Naproxen Sodium] Other (See Comments)    Headache   . Codeine Nausea Only and Other (See Comments)    GI upset  . Penicillins Nausea Only and Other (See Comments)    GI upset Has patient had a PCN reaction causing immediate rash, facial/tongue/throat swelling, SOB or lightheadedness with hypotension: No Has patient had a PCN reaction causing severe rash involving mucus membranes or skin necrosis: No Has patient had a PCN reaction that required hospitalization No Has patient had a  PCN reaction occurring within the last 10 years: No If all of the above answers are "NO", then may proceed with Cephalosporin use.   . Sulfonamide Derivatives Hives    Family History  Problem Relation Age of Onset  . COPD Mother   . Heart disease Mother   . Lung disease Father        Asbestosis  . Heart attack Father   . Heart disease Father   . Cerebral aneurysm Brother   . Aneurysm Brother        Brain  . Epilepsy Son   . Arthritis Maternal Grandmother   . Heart disease Maternal Grandmother   . Asthma Maternal Grandfather   . Cancer Maternal Grandfather   . Arthritis Paternal Grandmother   . Heart disease Paternal Grandmother   . Stroke Paternal Grandmother   . Early death Paternal Grandfather   . Heart disease Paternal Grandfather     Social History   Socioeconomic History  . Marital status: Married    Spouse name: Not on file  . Number of children: Not on file  . Years of education: Not on file  . Highest education level: Not on file  Occupational History  . Not on file  Social Needs  . Financial resource strain: Not on file  . Food insecurity:    Worry: Not on  file    Inability: Not on file  . Transportation needs:    Medical: Not on file    Non-medical: Not on file  Tobacco Use  . Smoking status: Current Every Day Smoker    Packs/day: 1.50    Years: 46.00    Pack years: 69.00    Types: Cigarettes  . Smokeless tobacco: Never Used  Substance and Sexual Activity  . Alcohol use: No  . Drug use: No  . Sexual activity: Yes  Lifestyle  . Physical activity:    Days per week: Not on file    Minutes per session: Not on file  . Stress: Not on file  Relationships  . Social connections:    Talks on phone: Not on file    Gets together: Not on file    Attends religious service: Not on file    Active member of club or organization: Not on file    Attends meetings of clubs or organizations: Not on file    Relationship status: Not on file  . Intimate partner violence:    Fear of current or ex partner: Not on file    Emotionally abused: Not on file    Physically abused: Not on file    Forced sexual activity: Not on file  Other Topics Concern  . Not on file  Social History Narrative  . Not on file   Review of Systems  Constitutional: Negative for chills and fever.  Eyes: Negative for blurred vision and double vision.  Respiratory: Negative for shortness of breath.   Cardiovascular: Negative for chest pain and palpitations.  Neurological: Negative for dizziness, loss of consciousness and headaches.  Endo/Heme/Allergies: Positive for environmental allergies.  Psychiatric/Behavioral: Positive for depression. Negative for hallucinations, substance abuse and suicidal ideas. The patient is not nervous/anxious and does not have insomnia.    BP 116/72   Pulse (!) 102   Temp 98.5 F (36.9 C) (Oral)   Resp 15   Ht 5' 2.25" (1.581 m)   Wt 254 lb 12.8 oz (115.6 kg)   SpO2 92%   BMI 46.23 kg/m   Physical Exam  Constitutional:  She is oriented to person, place, and time. She appears well-developed and well-nourished.  HENT:  Head: Normocephalic  and atraumatic.  Eyes: Pupils are equal, round, and reactive to light. Conjunctivae are normal.  Neck: Neck supple. No thyromegaly present.  Cardiovascular: Normal rate, regular rhythm and normal heart sounds.  Pulmonary/Chest: Effort normal and breath sounds normal. No stridor. No respiratory distress. She has no wheezes. She has no rales. She exhibits no tenderness.  Lymphadenopathy:    She has no cervical adenopathy.  Neurological: She is alert and oriented to person, place, and time.  Psychiatric: She has a normal mood and affect.  Vitals reviewed.   Recent Results (from the past 2160 hour(s))  Pulmonary function test     Status: None   Collection Time: 05/26/17 12:00 PM  Result Value Ref Range   FVC-Pre 2.23 L   FVC-%Pred-Pre 68 %   FVC-Post 2.38 L   FVC-%Pred-Post 72 %   FVC-%Change-Post 7 %   FEV1-Pre 1.40 L   FEV1-%Pred-Pre 55 %   FEV1-Post 1.60 L   FEV1-%Pred-Post 63 %   FEV1-%Change-Post 14 %   FEV6-Pre 2.17 L   FEV6-%Pred-Pre 68 %   FEV6-Post 2.38 L   FEV6-%Pred-Post 75 %   FEV6-%Change-Post 9 %   Pre FEV1/FVC ratio 63 %   FEV1FVC-%Pred-Pre 80 %   Post FEV1/FVC ratio 67 %   FEV1FVC-%Change-Post 6 %   Pre FEV6/FVC Ratio 97 %   FEV6FVC-%Pred-Pre 101 %   Post FEV6/FVC ratio 100 %   FEV6FVC-%Pred-Post 103 %   FEV6FVC-%Change-Post 2 %   FEF 25-75 Pre 0.60 L/sec   FEF2575-%Pred-Pre 26 %   FEF 25-75 Post 1.06 L/sec   FEF2575-%Pred-Post 46 %   FEF2575-%Change-Post 78 %   RV 2.84 L   RV % pred 141 %   TLC 5.32 L   TLC % pred 105 %   DLCO unc 13.35 ml/min/mmHg   DLCO unc % pred 55 %   DL/VA 3.06 ml/min/mmHg/L   DL/VA % pred 63 %  IBC panel     Status: Abnormal   Collection Time: 05/26/17  4:21 PM  Result Value Ref Range   Iron 31 (L) 42 - 145 ug/dL   Transferrin 324.0 212.0 - 360.0 mg/dL   Saturation Ratios 6.8 (L) 20.0 - 50.0 %  CBC with Differential/Platelet     Status: Abnormal   Collection Time: 05/26/17  4:21 PM  Result Value Ref Range   WBC 10.3  4.0 - 10.5 K/uL   RBC 4.01 3.87 - 5.11 Mil/uL   Hemoglobin 9.6 (L) 12.0 - 15.0 g/dL   HCT 30.4 (L) 36.0 - 46.0 %   MCV 75.7 (L) 78.0 - 100.0 fl   MCHC 31.6 30.0 - 36.0 g/dL   RDW 21.8 (H) 11.5 - 15.5 %   Platelets 242.0 150.0 - 400.0 K/uL   Neutrophils Relative % 54.9 43.0 - 77.0 %   Lymphocytes Relative 27.1 12.0 - 46.0 %   Monocytes Relative 7.7 3.0 - 12.0 %   Eosinophils Relative 8.7 (H) 0.0 - 5.0 %   Basophils Relative 1.6 0.0 - 3.0 %   Neutro Abs 5.7 1.4 - 7.7 K/uL   Lymphs Abs 2.8 0.7 - 4.0 K/uL   Monocytes Absolute 0.8 0.1 - 1.0 K/uL   Eosinophils Absolute 0.9 (H) 0.0 - 0.7 K/uL   Basophils Absolute 0.2 (H) 0.0 - 0.1 K/uL    Assessment/Plan: HYPERTENSION, BENIGN BP stable on current regimen. Asymptomatic. Continue current medication regimen. Will  obtain records for review. Patient to schedule CPE.  COPD GOLD II still smoking  Followed by Pulmonology. Continue care as directed by Dr. Melvyn Novas.  Hypothyroidism Recent labs look good. Will request full records. Continue levothyroxine daily.   Anxiety and depression Mild deterioration. No alarm signs/symptoms. Increase Sertraline to 200 mg daily. Close follow-up scheduled.   Hyperlipidemia Continue statin. Dietary and exercise recommendations reviewed.   Iron deficiency anemia Followed by GI. Is scheduled for colonoscopy and EGD this upcoming week.   Pilar cyst With infection. Start Keflex. Discussed dermatology for excision once infection is resolved. She will give some thought to this.     [name], PA-C

## 2017-07-19 ENCOUNTER — Other Ambulatory Visit: Payer: Self-pay

## 2017-07-19 MED ORDER — TRIAMTERENE-HCTZ 37.5-25 MG PO CAPS: 1.0000 | ORAL_CAPSULE | Freq: Every day | ORAL | 0 refills | Status: DC

## 2017-07-21 ENCOUNTER — Telehealth: Payer: Self-pay | Admitting: Emergency Medicine

## 2017-07-21 MED ORDER — CEPHALEXIN 500 MG PO CAPS: 500.0000 mg | ORAL_CAPSULE | Freq: Two times a day (BID) | ORAL | 0 refills | Status: AC

## 2017-07-21 MED ORDER — LEVOTHYROXINE SODIUM 75 MCG PO TABS: 75.0000 ug | ORAL_TABLET | Freq: Every day | ORAL | 3 refills | Status: DC

## 2017-07-21 MED ORDER — ALPRAZOLAM 1 MG PO TABS: 1.0000 mg | ORAL_TABLET | Freq: Three times a day (TID) | ORAL | 0 refills | Status: DC | PRN

## 2017-07-21 NOTE — Telephone Encounter (Signed)
Patient called [facility] on 07/19/17 that rx were not sent to the pharmacy. Patient pharmacy is [facility]. Please advise of refill of medications and antibiotic for scalp

## 2017-07-21 NOTE — Telephone Encounter (Signed)
The medications she noted needing refill at visit -- Dyazide and Sertraline were sent to pharmacy.   Please see what other medications she is needing currently so we can get this taken care of.

## 2017-07-21 NOTE — Telephone Encounter (Signed)
Verified with patient which medications she needed refilled.  She requested the Levothyroxine and Alprazolam Is Keflex abx ok since patient allergy of penicillin Please advise

## 2017-07-21 NOTE — Telephone Encounter (Signed)
Regarding antibiotic, ok to send in Keflex 500 mg BID x 7 days.

## 2017-07-22 DIAGNOSIS — F32A Depression, unspecified: Secondary | ICD-10-CM | POA: Insufficient documentation

## 2017-07-22 DIAGNOSIS — L7211 Pilar cyst: Secondary | ICD-10-CM | POA: Insufficient documentation

## 2017-07-22 DIAGNOSIS — D509 Iron deficiency anemia, unspecified: Secondary | ICD-10-CM | POA: Insufficient documentation

## 2017-07-22 DIAGNOSIS — F419 Anxiety disorder, unspecified: Secondary | ICD-10-CM

## 2017-07-22 DIAGNOSIS — E039 Hypothyroidism, unspecified: Secondary | ICD-10-CM | POA: Insufficient documentation

## 2017-07-22 DIAGNOSIS — F329 Major depressive disorder, single episode, unspecified: Secondary | ICD-10-CM | POA: Insufficient documentation

## 2017-07-22 NOTE — Assessment & Plan Note (Signed)
Recent labs look good. Will request full records. Continue levothyroxine daily.

## 2017-07-22 NOTE — Assessment & Plan Note (Signed)
Continue statin. Dietary and exercise recommendations reviewed.

## 2017-07-22 NOTE — Assessment & Plan Note (Signed)
BP stable on current regimen. Asymptomatic. Continue current medication regimen. Will obtain records for review. Patient to schedule CPE.

## 2017-07-22 NOTE — Assessment & Plan Note (Signed)
Followed by Pulmonology. Continue care as directed by Dr. Melvyn Novas.

## 2017-07-22 NOTE — Assessment & Plan Note (Signed)
With infection. Start Keflex. Discussed dermatology for excision once infection is resolved. She will give some thought to this.

## 2017-07-22 NOTE — Assessment & Plan Note (Signed)
Mild deterioration. No alarm signs/symptoms. Increase Sertraline to 200 mg daily. Close follow-up scheduled.

## 2017-07-22 NOTE — Assessment & Plan Note (Signed)
Followed by GI. Is scheduled for colonoscopy and EGD this upcoming week.

## 2017-07-24 ENCOUNTER — Ambulatory Visit (HOSPITAL_COMMUNITY)
Admission: RE | Admit: 2017-07-24 | Discharge: 2017-07-24 | Disposition: A | Discharge status: Home / Self Care | Payer: BLUE CROSS/BLUE SHIELD | Source: Ambulatory Visit | Attending: Gastroenterology | Admitting: Gastroenterology

## 2017-07-24 ENCOUNTER — Ambulatory Visit (HOSPITAL_COMMUNITY): Payer: BLUE CROSS/BLUE SHIELD | Admitting: Certified Registered Nurse Anesthetist

## 2017-07-24 ENCOUNTER — Encounter (HOSPITAL_COMMUNITY): Payer: Self-pay

## 2017-07-24 ENCOUNTER — Encounter (HOSPITAL_COMMUNITY)
Admission: RE | Disposition: A | Discharge status: Home / Self Care | Payer: Self-pay | Source: Ambulatory Visit | Attending: Gastroenterology

## 2017-07-24 DIAGNOSIS — Z8249 Family history of ischemic heart disease and other diseases of the circulatory system: Secondary | ICD-10-CM | POA: Insufficient documentation

## 2017-07-24 DIAGNOSIS — Z9981 Dependence on supplemental oxygen: Secondary | ICD-10-CM | POA: Diagnosis not present

## 2017-07-24 DIAGNOSIS — M419 Scoliosis, unspecified: Secondary | ICD-10-CM | POA: Insufficient documentation

## 2017-07-24 DIAGNOSIS — Z96641 Presence of right artificial hip joint: Secondary | ICD-10-CM | POA: Diagnosis not present

## 2017-07-24 DIAGNOSIS — K259 Gastric ulcer, unspecified as acute or chronic, without hemorrhage or perforation: Secondary | ICD-10-CM | POA: Insufficient documentation

## 2017-07-24 DIAGNOSIS — Z79899 Other long term (current) drug therapy: Secondary | ICD-10-CM | POA: Insufficient documentation

## 2017-07-24 DIAGNOSIS — Z885 Allergy status to narcotic agent status: Secondary | ICD-10-CM | POA: Insufficient documentation

## 2017-07-24 DIAGNOSIS — K3189 Other diseases of stomach and duodenum: Secondary | ICD-10-CM | POA: Diagnosis not present

## 2017-07-24 DIAGNOSIS — K2970 Gastritis, unspecified, without bleeding: Secondary | ICD-10-CM | POA: Diagnosis not present

## 2017-07-24 DIAGNOSIS — K219 Gastro-esophageal reflux disease without esophagitis: Secondary | ICD-10-CM | POA: Diagnosis not present

## 2017-07-24 DIAGNOSIS — F419 Anxiety disorder, unspecified: Secondary | ICD-10-CM | POA: Diagnosis not present

## 2017-07-24 DIAGNOSIS — D122 Benign neoplasm of ascending colon: Secondary | ICD-10-CM | POA: Insufficient documentation

## 2017-07-24 DIAGNOSIS — Z882 Allergy status to sulfonamides status: Secondary | ICD-10-CM | POA: Insufficient documentation

## 2017-07-24 DIAGNOSIS — D509 Iron deficiency anemia, unspecified: Secondary | ICD-10-CM | POA: Diagnosis not present

## 2017-07-24 DIAGNOSIS — K5730 Diverticulosis of large intestine without perforation or abscess without bleeding: Secondary | ICD-10-CM

## 2017-07-24 DIAGNOSIS — F17200 Nicotine dependence, unspecified, uncomplicated: Secondary | ICD-10-CM | POA: Diagnosis not present

## 2017-07-24 DIAGNOSIS — Z886 Allergy status to analgesic agent status: Secondary | ICD-10-CM | POA: Diagnosis not present

## 2017-07-24 DIAGNOSIS — J449 Chronic obstructive pulmonary disease, unspecified: Secondary | ICD-10-CM | POA: Insufficient documentation

## 2017-07-24 DIAGNOSIS — K2990 Gastroduodenitis, unspecified, without bleeding: Secondary | ICD-10-CM

## 2017-07-24 DIAGNOSIS — K635 Polyp of colon: Secondary | ICD-10-CM | POA: Diagnosis not present

## 2017-07-24 DIAGNOSIS — Z6841 Body Mass Index (BMI) 40.0 and over, adult: Secondary | ICD-10-CM | POA: Insufficient documentation

## 2017-07-24 DIAGNOSIS — D126 Benign neoplasm of colon, unspecified: Secondary | ICD-10-CM | POA: Diagnosis not present

## 2017-07-24 DIAGNOSIS — D649 Anemia, unspecified: Secondary | ICD-10-CM

## 2017-07-24 DIAGNOSIS — F329 Major depressive disorder, single episode, unspecified: Secondary | ICD-10-CM | POA: Insufficient documentation

## 2017-07-24 DIAGNOSIS — E039 Hypothyroidism, unspecified: Secondary | ICD-10-CM | POA: Insufficient documentation

## 2017-07-24 DIAGNOSIS — I10 Essential (primary) hypertension: Secondary | ICD-10-CM | POA: Insufficient documentation

## 2017-07-24 DIAGNOSIS — Z880 Allergy status to penicillin: Secondary | ICD-10-CM | POA: Insufficient documentation

## 2017-07-24 DIAGNOSIS — E785 Hyperlipidemia, unspecified: Secondary | ICD-10-CM | POA: Diagnosis not present

## 2017-07-24 DIAGNOSIS — M1990 Unspecified osteoarthritis, unspecified site: Secondary | ICD-10-CM | POA: Insufficient documentation

## 2017-07-24 HISTORY — PX: COLONOSCOPY WITH PROPOFOL: SHX5780

## 2017-07-24 HISTORY — PX: ESOPHAGOGASTRODUODENOSCOPY: SHX5428

## 2017-07-24 HISTORY — PX: POLYPECTOMY: SHX5525

## 2017-07-24 HISTORY — PX: BIOPSY: SHX5522

## 2017-07-24 SURGERY — COLONOSCOPY WITH PROPOFOL
Anesthesia: Monitor Anesthesia Care

## 2017-07-24 MED ORDER — LACTATED RINGERS IV SOLN
INTRAVENOUS | Status: AC | PRN
  Administered 2017-07-24: 1000 mL via INTRAVENOUS

## 2017-07-24 MED ORDER — PROPOFOL 10 MG/ML IV BOLUS
INTRAVENOUS | Status: AC
  Filled 2017-07-24: qty 60

## 2017-07-24 MED ORDER — SODIUM CHLORIDE 0.9 % IV SOLN: INTRAVENOUS | Status: DC

## 2017-07-24 MED ORDER — PROPOFOL 10 MG/ML IV BOLUS
INTRAVENOUS | Status: DC | PRN
  Administered 2017-07-24 (×3): 20 mg via INTRAVENOUS

## 2017-07-24 MED ORDER — PROPOFOL 500 MG/50ML IV EMUL
INTRAVENOUS | Status: DC | PRN
  Administered 2017-07-24: 125 ug/kg/min via INTRAVENOUS

## 2017-07-24 MED ORDER — ONDANSETRON HCL 4 MG/2ML IJ SOLN
INTRAMUSCULAR | Status: DC | PRN
  Administered 2017-07-24: 4 mg via INTRAVENOUS

## 2017-07-24 SURGICAL SUPPLY — 21 items

## 2017-07-24 NOTE — Anesthesia Postprocedure Evaluation (Signed)
Anesthesia Post Note  Patient: [name]  Procedure(s) Performed: COLONOSCOPY WITH PROPOFOL (N/A ) ESOPHAGOGASTRODUODENOSCOPY (EGD) (N/A ) POLYPECTOMY BIOPSY     Anesthesia Type: MAC    Last Vitals:  Vitals:   07/24/17 0850 07/24/17 0900  BP: (!) 91/48 (!) 111/59  Pulse: 86 86  Resp: 15 19  Temp:    SpO2: 99% 98%    Last Pain:  Vitals:   07/24/17 0900  TempSrc:   PainSc: 0-No pain                 Kenslie Abbruzzese S

## 2017-07-24 NOTE — Transfer of Care (Signed)
Immediate Anesthesia Transfer of Care Note  Patient: LEIALOHA HANNA  Procedure(s) Performed: COLONOSCOPY WITH PROPOFOL (N/A ) ESOPHAGOGASTRODUODENOSCOPY (EGD) (N/A ) POLYPECTOMY BIOPSY  Patient Location: PACU and Endoscopy Unit  Anesthesia Type:MAC  Level of Consciousness: awake, alert , oriented and patient cooperative  Airway & Oxygen Therapy: Patient Spontanous Breathing and Patient connected to nasal cannula oxygen  Post-op Assessment: Report given to RN and Post -op Vital signs reviewed and stable  Post vital signs: Reviewed and stable  Last Vitals:  Vitals Value Taken Time  BP    Temp    Pulse    Resp    SpO2      Last Pain:  Vitals:   07/24/17 0720  TempSrc: Oral  PainSc: 0-No pain         Complications: No apparent anesthesia complications

## 2017-07-24 NOTE — Discharge Instructions (Signed)
YOU HAD AN ENDOSCOPIC PROCEDURE TODAY: Refer to the procedure report and other information in the discharge instructions given to you for any specific questions about what was found during the examination. If this information does not answer your questions, please call [place] office at [phone] to clarify.  ° °YOU SHOULD EXPECT: Some feelings of bloating in the abdomen. Passage of more gas than usual. Walking can help get rid of the air that was put into your GI tract during the procedure and reduce the bloating. If you had a lower endoscopy (such as a colonoscopy or flexible sigmoidoscopy) you may notice spotting of blood in your stool or on the toilet paper. Some abdominal soreness may be present for a day or two, also. ° °DIET: Your first meal following the procedure should be a light meal and then it is ok to progress to your normal diet. A half-sandwich or bowl of soup is an example of a good first meal. Heavy or fried foods are harder to digest and may make you feel nauseous or bloated. Drink plenty of fluids but you should avoid alcoholic beverages for 24 hours. If you had a esophageal dilation, please see attached instructions for diet.   ° °ACTIVITY: Your care partner should take you home directly after the procedure. You should plan to take it easy, moving slowly for the rest of the day. You can resume normal activity the day after the procedure however YOU SHOULD NOT DRIVE, use power tools, machinery or perform tasks that involve climbing or major physical exertion for 24 hours (because of the sedation medicines used during the test).  ° °SYMPTOMS TO REPORT IMMEDIATELY: °A gastroenterologist can be reached at any hour. Please call [phone]  for any of the following symptoms:  °Following lower endoscopy (colonoscopy, flexible sigmoidoscopy) °Excessive amounts of blood in the stool  °Significant tenderness, worsening of abdominal pains  °Swelling of the abdomen that is new, acute  °Fever of 100° or  higher  °Following upper endoscopy (EGD, EUS, ERCP, esophageal dilation) °Vomiting of blood or coffee ground material  °New, significant abdominal pain  °New, significant chest pain or pain under the shoulder blades  °Painful or persistently difficult swallowing  °New shortness of breath  °Black, tarry-looking or red, bloody stools ° °FOLLOW UP:  °If any biopsies were taken you will be contacted by phone or by letter within the next 1-3 weeks. Call [phone]  if you have not heard about the biopsies in 3 weeks.  °Please also call with any specific questions about appointments or follow up tests. ° °

## 2017-07-24 NOTE — Op Note (Signed)
[facility] Patient Name: [name] Procedure Date: 07/24/2017 MRN: [id number] Attending MD: Milus Banister , MD Date of Birth: [birth year] CSN: [id number] Age: 62 Admit Type: Outpatient Procedure:                Upper GI endoscopy Indications:              Iron deficiency anemia Providers:                Milus Banister, MD, Cleda Daub, RN, Charolette Child, Technician Referring MD:              Medicines:                Monitored Anesthesia Care Complications:            No immediate complications. Estimated blood loss:                            None. Estimated Blood Loss:     Estimated blood loss: none. Procedure:                Pre-Anesthesia Assessment:                           - Prior to the procedure, a History and Physical                            was performed, and patient medications and                            allergies were reviewed. The patient's tolerance of                            previous anesthesia was also reviewed. The risks                            and benefits of the procedure and the sedation                            options and risks were discussed with the patient.                            All questions were answered, and informed consent                            was obtained. Prior Anticoagulants: The patient has                            taken no previous anticoagulant or antiplatelet                            agents. ASA Grade Assessment: IV - A patient with                            severe  systemic disease that is a constant threat                            to life. After reviewing the risks and benefits,                            the patient was deemed in satisfactory condition to                            undergo the procedure.                           After obtaining informed consent, the endoscope was                            passed under direct vision. Throughout the                   procedure, the patient's blood pressure, pulse, and                            oxygen saturations were monitored continuously. The                            EG-[id number] ([id number]) scope was introduced through the                            mouth, and advanced to the second part of duodenum.                            The upper GI endoscopy was accomplished without                            difficulty. The patient tolerated the procedure                            well. Scope In: Scope Out: Findings:      There was mild to moderate gastritis in the distal stomach as well as       anatomic abnormality of the pre-pylorus that appears to be a site of       previous ulcer disease (puckering without neoplastic mucosa or       signficant stricture). Biopsies were taken from the distal stomach with       a cold forceps for histology.      The exam was otherwise without abnormality. Impression:               - Gastritis and anatomic abnormality of the                            pre-pylorus that suggests previous ulcer disease.                            Biopsies taken for H. pylori.                           -  The examination was otherwise normal. Moderate Sedation:      N/A- Per Anesthesia Care Recommendation:           - Patient has a contact number available for                            emergencies. The signs and symptoms of potential                            delayed complications were discussed with the                            patient. Return to normal activities tomorrow.                            Written discharge instructions were provided to the                            patient.                           - Resume previous diet.                           - Continue present medications. continue omeprazole                            twice daily. Continue to avoid NSAID type medicines                            (including Goodies). Tylenol is the safest OTC pain                             medicine for your stomach. Please also start once                            daily OTC iron supplement (ferrous sulfate 325mg ).                           - Labs through [facility] in 2 months (CBC) to see                            if your blood counts improve on the above regimen.                           - Await pathology results. Will treat appropriately                            if + for H. pylori. Procedure Code(s):        --- Professional ---                           [phone], Esophagogastroduodenoscopy, flexible,  transoral; with biopsy, single or multiple Diagnosis Code(s):        --- Professional ---                           K29.70, Gastritis, unspecified, without bleeding                           D50.9, Iron deficiency anemia, unspecified CPT copyright 2017 American Medical Association. All rights reserved. The codes documented in this report are preliminary and upon coder review may  be revised to meet current compliance requirements. Milus Banister, MD 07/24/2017 8:46:59 AM This report has been signed electronically. Number of Addenda: 0

## 2017-07-24 NOTE — Anesthesia Procedure Notes (Signed)
Date/Time: 07/24/2017 7:50 AM Performed by: Claudia Desanctis, CRNA Oxygen Delivery Method: Simple face mask

## 2017-07-24 NOTE — Interval H&P Note (Signed)
History and Physical Interval Note:  07/24/2017 7:20 AM  [name]  has presented today for surgery, with the diagnosis of anemia  The various methods of treatment have been discussed with the patient and family. After consideration of risks, benefits and other options for treatment, the patient has consented to  Procedure(s): COLONOSCOPY WITH PROPOFOL (N/A) ESOPHAGOGASTRODUODENOSCOPY (EGD) (N/A) as a surgical intervention .  The patient's history has been reviewed, patient examined, no change in status, stable for surgery.  I have reviewed the patient's chart and labs.  Questions were answered to the patient's satisfaction.     Milus Banister

## 2017-07-24 NOTE — Anesthesia Preprocedure Evaluation (Signed)
Anesthesia Evaluation  Patient identified by MRN, date of birth, ID band Patient awake    Reviewed: Allergy & Precautions, NPO status , Patient's Chart, lab work & pertinent test results  Airway Mallampati: II  TM Distance: <3 FB Neck ROM: Full    Dental no notable dental hx.    Pulmonary COPD,  oxygen dependent, Current Smoker,    breath sounds clear to auscultation + decreased breath sounds      Cardiovascular hypertension, Normal cardiovascular exam Rhythm:Regular Rate:Normal     Neuro/Psych negative neurological ROS  negative psych ROS   GI/Hepatic negative GI ROS, Neg liver ROS,   Endo/Other  Hypothyroidism Morbid obesity  Renal/GU negative Renal ROS  negative genitourinary   Musculoskeletal negative musculoskeletal ROS (+)   Abdominal   Peds negative pediatric ROS (+)  Hematology negative hematology ROS (+)   Anesthesia Other Findings   Reproductive/Obstetrics negative OB ROS                             Anesthesia Physical Anesthesia Plan  ASA: IV  Anesthesia Plan: MAC   Post-op Pain Management:    Induction: Intravenous  PONV Risk Score and Plan: 0  Airway Management Planned: Simple Face Mask  Additional Equipment:   Intra-op Plan:   Post-operative Plan:   Informed Consent: I have reviewed the patients History and Physical, chart, labs and discussed the procedure including the risks, benefits and alternatives for the proposed anesthesia with the patient or authorized representative who has indicated his/her understanding and acceptance.   Dental advisory given  Plan Discussed with: CRNA and Surgeon  Anesthesia Plan Comments:         Anesthesia Quick Evaluation

## 2017-07-24 NOTE — Op Note (Signed)
[facility] Patient Name: [name] Procedure Date: 07/24/2017 MRN: [id number] Attending MD: Milus Banister , MD Date of Birth: July 30, 1955 CSN: [id number] Age: 62 Admit Type: Outpatient Procedure:                Colonoscopy Indications:              Iron deficiency anemia Providers:                Milus Banister, MD, Cleda Daub, RN, Charolette Child, Technician Referring MD:              Medicines:                Monitored Anesthesia Care Complications:            No immediate complications. Estimated blood loss:                            None. Estimated Blood Loss:     Estimated blood loss: none. Procedure:                Pre-Anesthesia Assessment:                           - Prior to the procedure, a History and Physical                            was performed, and patient medications and                            allergies were reviewed. The patient's tolerance of                            previous anesthesia was also reviewed. The risks                            and benefits of the procedure and the sedation                            options and risks were discussed with the patient.                            All questions were answered, and informed consent                            was obtained. Prior Anticoagulants: The patient has                            taken no previous anticoagulant or antiplatelet                            agents. ASA Grade Assessment: IV - A patient with                            severe systemic disease  that is a constant threat                            to life. After reviewing the risks and benefits,                            the patient was deemed in satisfactory condition to                            undergo the procedure.                           After obtaining informed consent, the colonoscope                            was passed under direct vision. Throughout the              procedure, the patient's blood pressure, pulse, and                            oxygen saturations were monitored continuously. The                            was introduced through the anus and advanced to the                            the cecum, identified by appendiceal orifice and                            ileocecal valve. The colonoscopy was performed                            without difficulty. The patient tolerated the                            procedure well. The quality of the bowel                            preparation was good. The ileocecal valve,                            appendiceal orifice, and rectum were photographed. Scope In: 8:01:54 AM Scope Out: 8:18:37 AM Scope Withdrawal Time: 0 hours 10 minutes 44 seconds  Total Procedure Duration: 0 hours 16 minutes 43 seconds  Findings:      A 3 mm polyp was found in the ascending colon. The polyp was sessile.       The polyp was removed with a cold snare. Resection and retrieval were       complete.      Multiple small-mouthed diverticula were found in the left colon.      The exam was otherwise without abnormality on direct and retroflexion       views. Impression:               - One 3 mm polyp in the ascending colon, removed  with a cold snare. Resected and retrieved.                           - Diverticulosis in the left colon.                           - The examination was otherwise normal on direct                            and retroflexion views. Moderate Sedation:      N/A- Per Anesthesia Care Recommendation:           - EGD now to continue the IDA workup.                           If the polyp(s) is proven to be 'pre-cancerous' on                            pathology, you will need repeat colonoscopy in 5                            years. If the polyp(s) is NOT 'precancerous' on                            pathology then you should repeat colon cancer                             screening in 10 years with colonoscopy without need                            for colon cancer screening by any method prior to                            then (including stool testing). Procedure Code(s):        --- Professional ---                           [phone], Colonoscopy, flexible; with removal of                            tumor(s), polyp(s), or other lesion(s) by snare                            technique Diagnosis Code(s):        --- Professional ---                           D12.2, Benign neoplasm of ascending colon                           D50.9, Iron deficiency anemia, unspecified                           K57.30, Diverticulosis of large intestine without  perforation or abscess without bleeding CPT copyright 2017 [employer]. All rights reserved. The codes documented in this report are preliminary and upon coder review may  be revised to meet current compliance requirements. Milus Banister, MD 07/24/2017 8:21:09 AM This report has been signed electronically. Number of Addenda: 0

## 2017-07-25 ENCOUNTER — Encounter (HOSPITAL_COMMUNITY): Payer: Self-pay | Admitting: Gastroenterology

## 2017-07-28 NOTE — Telephone Encounter (Signed)
Pt states that she believes there has been in an error with her xanax rx of how many tablets were given and would like to speak with a nurse regarding.

## 2017-07-29 ENCOUNTER — Other Ambulatory Visit: Payer: Self-pay

## 2017-07-29 DIAGNOSIS — D649 Anemia, unspecified: Secondary | ICD-10-CM

## 2017-07-29 NOTE — Telephone Encounter (Signed)
Called patient and left a voice message on her home number.   Her Xanax is to be used as needed only, not to be used everyday as 3 times daily, only to be used as needed and the quantity is correct. Please advise patient. Thank you!  CRM Created.

## 2017-08-04 ENCOUNTER — Encounter: Payer: Self-pay | Admitting: Physician Assistant

## 2017-08-04 DIAGNOSIS — G894 Chronic pain syndrome: Secondary | ICD-10-CM | POA: Insufficient documentation

## 2017-08-04 DIAGNOSIS — G4733 Obstructive sleep apnea (adult) (pediatric): Secondary | ICD-10-CM | POA: Insufficient documentation

## 2017-08-04 DIAGNOSIS — G4736 Sleep related hypoventilation in conditions classified elsewhere: Secondary | ICD-10-CM | POA: Insufficient documentation

## 2017-08-04 DIAGNOSIS — F32A Depression, unspecified: Secondary | ICD-10-CM | POA: Insufficient documentation

## 2017-08-04 DIAGNOSIS — F329 Major depressive disorder, single episode, unspecified: Secondary | ICD-10-CM | POA: Insufficient documentation

## 2017-08-04 DIAGNOSIS — K760 Fatty (change of) liver, not elsewhere classified: Secondary | ICD-10-CM | POA: Insufficient documentation

## 2017-08-05 ENCOUNTER — Encounter: Payer: Self-pay | Admitting: Emergency Medicine

## 2017-08-05 ENCOUNTER — Telehealth: Payer: Self-pay | Admitting: Gastroenterology

## 2017-08-05 MED ORDER — RANITIDINE HCL 150 MG PO TABS: 150.0000 mg | ORAL_TABLET | Freq: Two times a day (BID) | ORAL | 3 refills | Status: DC

## 2017-08-05 MED ORDER — OMEPRAZOLE 40 MG PO CPDR: 40.0000 mg | DELAYED_RELEASE_CAPSULE | Freq: Two times a day (BID) | ORAL | 3 refills | Status: DC

## 2017-08-05 NOTE — Telephone Encounter (Signed)
Pt would like to speak with you regarding Omeprazole and ranitidine.

## 2017-08-05 NOTE — Telephone Encounter (Signed)
The pt is aware that her refills have been corrected to #90 and sent to the pharmacy

## 2017-08-14 DIAGNOSIS — F341 Dysthymic disorder: Secondary | ICD-10-CM | POA: Diagnosis not present

## 2017-08-18 ENCOUNTER — Encounter: Payer: Self-pay | Admitting: Physician Assistant

## 2017-08-18 ENCOUNTER — Other Ambulatory Visit: Payer: Self-pay

## 2017-08-18 ENCOUNTER — Encounter: Payer: Self-pay | Admitting: General Practice

## 2017-08-18 ENCOUNTER — Encounter: Payer: Self-pay | Admitting: Emergency Medicine

## 2017-08-18 ENCOUNTER — Ambulatory Visit (INDEPENDENT_AMBULATORY_CARE_PROVIDER_SITE_OTHER): Payer: BLUE CROSS/BLUE SHIELD | Admitting: Physician Assistant

## 2017-08-18 VITALS — BP 140/82 | HR 91 | Temp 98.0°F | Resp 17 | Ht 62.25 in | Wt 258.0 lb

## 2017-08-18 DIAGNOSIS — R1310 Dysphagia, unspecified: Secondary | ICD-10-CM | POA: Diagnosis not present

## 2017-08-18 DIAGNOSIS — F419 Anxiety disorder, unspecified: Secondary | ICD-10-CM

## 2017-08-18 DIAGNOSIS — F329 Major depressive disorder, single episode, unspecified: Secondary | ICD-10-CM | POA: Diagnosis not present

## 2017-08-18 DIAGNOSIS — F32A Depression, unspecified: Secondary | ICD-10-CM

## 2017-08-18 MED ORDER — ALPRAZOLAM 1 MG PO TABS: 1.0000 mg | ORAL_TABLET | Freq: Three times a day (TID) | ORAL | 1 refills | Status: DC | PRN

## 2017-08-18 NOTE — Progress Notes (Signed)
Patient presents to clinic today for follow-up of anxiety. Patient is currently on a regimen of Sertraline 200 mg daily and Xanax 1 mg TID which she has been on for > 30 years.[name] taking medications as directed, requiring the Xanax three times daily. Notes being prescribed this for the past 30 years. Denies depressed mood, anhedonia or SI/HI. Notes panic attack without her alprazolam.   Patient also followed by Gastroenterology for GERD/Gastritis. Recently underwent EGD that revealed gastritis.. Notes since then she has had a mild sore throat and difficulty swallowing with solids. Patient continues to smoke without desire or willingness to quit despite risks.  Past Medical History:  Diagnosis Date  . Allergic rhinitis   . Anemia   . Anxiety   . Chicken pox   . Chronic back pain   . COPD (chronic obstructive pulmonary disease) ([place])   . Depression   . Essential hypertension   . GERD (gastroesophageal reflux disease)   . Headache    migraines  . History of gastritis    EGD 2015  . History of home oxygen therapy    2 liters at hs last 6 months  . Hyperlipidemia   . Hypothyroidism   . Migraines   . Osteoarthritis    oa  . Scoliosis     Current Outpatient Medications on File Prior to Visit  Medication Sig Dispense Refill  . albuterol (PROAIR HFA) 108 (90 Base) MCG/ACT inhaler 2 puffs every 4 hours as needed only  if your can't catch your breath (Patient taking differently: Inhale 2 puffs into the lungs every 4 (four) hours as needed for wheezing or shortness of breath. ) 1 Inhaler 1  . atorvastatin (LIPITOR) 20 MG tablet Take 20 mg by mouth at bedtime.     . ferrous sulfate 325 (65 FE) MG EC tablet Take 325 mg by mouth 2 (two) times daily.    . fluticasone (FLONASE) 50 MCG/ACT nasal spray Place 2 sprays into both nostrils daily.    Marland Kitchen gabapentin (NEURONTIN) 300 MG capsule Take 600 mg by mouth 3 times daily    . HYDROcodone-acetaminophen (NORCO) 10-325 MG tablet Take 1 tablet  by mouth 3 (three) times daily. 90 tablet 0  . levothyroxine (SYNTHROID, LEVOTHROID) 75 MCG tablet Take 1 tablet (75 mcg total) by mouth daily before breakfast. 30 tablet 3  . loratadine (CLARITIN) 10 MG tablet Take 10 mg by mouth daily.    Marland Kitchen omeprazole (PRILOSEC) 40 MG capsule Take 1 capsule (40 mg total) by mouth 2 (two) times daily. Before breakfast and dinner 180 capsule 3  . ondansetron (ZOFRAN) 4 MG tablet Take 1 pill 1 hour prior to both doses of colon prep (Patient taking differently: Take 4 mg by mouth daily as needed for nausea or vomiting. ) 2 tablet 0  . OXYGEN Inhale 2 L into the lungs at bedtime.     . polyethylene glycol-electrolytes (NULYTELY/GOLYTELY) 420 g solution Take 4,000 mLs by mouth as directed. 4000 mL 0  . potassium chloride (K-DUR) 10 MEQ tablet Take 10 mEq by mouth at bedtime.     . ranitidine (ZANTAC) 150 MG tablet Take 1 tablet (150 mg total) by mouth 2 (two) times daily. 180 tablet 3  . sertraline (ZOLOFT) 100 MG tablet Take 2 tablets (200 mg total) by mouth at bedtime. 60 tablet 3  . Tiotropium Bromide Monohydrate (SPIRIVA RESPIMAT) 2.5 MCG/ACT AERS Inhale 2 puffs into the lungs daily. 2 Inhaler 0  . triamterene-hydrochlorothiazide (DYAZIDE) 37.5-25 MG capsule  Take 1 each (1 capsule total) by mouth daily. 90 capsule 0   No current facility-administered medications on file prior to visit.     Allergies  Allergen Reactions  . Nsaids Diarrhea  . Aleve [Naproxen Sodium] Other (See Comments)    Headache   . Codeine Nausea Only and Other (See Comments)    GI upset  . Penicillins Nausea Only and Other (See Comments)    GI upset Has patient had a PCN reaction causing immediate rash, facial/tongue/throat swelling, SOB or lightheadedness with hypotension: No Has patient had a PCN reaction causing severe rash involving mucus membranes or skin necrosis: No Has patient had a PCN reaction that required hospitalization No Has patient had a PCN reaction occurring within  the last 10 years: No If all of the above answers are "NO", then may proceed with Cephalosporin use.   . Sulfonamide Derivatives Hives    Family History  Problem Relation Age of Onset  . COPD Mother   . Heart disease Mother   . Lung disease Father        Asbestosis  . Heart attack Father   . Heart disease Father   . Cerebral aneurysm Brother   . Aneurysm Brother        Brain  . Epilepsy Son   . Arthritis Maternal Grandmother   . Heart disease Maternal Grandmother   . Asthma Maternal Grandfather   . Cancer Maternal Grandfather   . Arthritis Paternal Grandmother   . Heart disease Paternal Grandmother   . Stroke Paternal Grandmother   . Early death Paternal Grandfather   . Heart disease Paternal Grandfather     Social History   Socioeconomic History  . Marital status: Married    Spouse name: Not on file  . Number of children: Not on file  . Years of education: Not on file  . Highest education level: Not on file  Occupational History  . Not on file  Social Needs  . Financial resource strain: Not on file  . Food insecurity:    Worry: Not on file    Inability: Not on file  . Transportation needs:    Medical: Not on file    Non-medical: Not on file  Tobacco Use  . Smoking status: Current Every Day Smoker    Packs/day: 1.50    Years: 46.00    Pack years: 69.00    Types: Cigarettes  . Smokeless tobacco: Never Used  Substance and Sexual Activity  . Alcohol use: No  . Drug use: No  . Sexual activity: Yes  Lifestyle  . Physical activity:    Days per week: Not on file    Minutes per session: Not on file  . Stress: Not on file  Relationships  . Social connections:    Talks on phone: Not on file    Gets together: Not on file    Attends religious service: Not on file    Active member of club or organization: Not on file    Attends meetings of clubs or organizations: Not on file    Relationship status: Not on file  Other Topics Concern  . Not on file  Social  History Narrative  . Not on file   Review of Systems - See HPI.  All other ROS are negative.  BP 140/82   Pulse 91   Temp 98 F (36.7 C) (Oral)   Resp 17   Ht 5' 2.25" (1.581 m)   Wt 258 lb (117 kg)  SpO2 95%   BMI 46.81 kg/m   Physical Exam  Constitutional: She is oriented to person, place, and time. She appears well-developed and well-nourished.  HENT:  Head: Normocephalic and atraumatic.  Nose: Nose normal.  Mouth/Throat: Oropharynx is clear and moist.  Eyes: Conjunctivae are normal.  Neck: Neck supple.  Cardiovascular: Normal rate, regular rhythm and normal heart sounds.  Pulmonary/Chest: Effort normal.  Lymphadenopathy:    She has no cervical adenopathy.  Neurological: She is alert and oriented to person, place, and time.  Psychiatric: She has a normal mood and affect.  Vitals reviewed.   Recent Results (from the past 2160 hour(s))  Pulmonary function test     Status: None   Collection Time: 05/26/17 12:00 PM  Result Value Ref Range   FVC-Pre 2.23 L   FVC-%Pred-Pre 68 %   FVC-Post 2.38 L   FVC-%Pred-Post 72 %   FVC-%Change-Post 7 %   FEV1-Pre 1.40 L   FEV1-%Pred-Pre 55 %   FEV1-Post 1.60 L   FEV1-%Pred-Post 63 %   FEV1-%Change-Post 14 %   FEV6-Pre 2.17 L   FEV6-%Pred-Pre 68 %   FEV6-Post 2.38 L   FEV6-%Pred-Post 75 %   FEV6-%Change-Post 9 %   Pre FEV1/FVC ratio 63 %   FEV1FVC-%Pred-Pre 80 %   Post FEV1/FVC ratio 67 %   FEV1FVC-%Change-Post 6 %   Pre FEV6/FVC Ratio 97 %   FEV6FVC-%Pred-Pre 101 %   Post FEV6/FVC ratio 100 %   FEV6FVC-%Pred-Post 103 %   FEV6FVC-%Change-Post 2 %   FEF 25-75 Pre 0.60 L/sec   FEF2575-%Pred-Pre 26 %   FEF 25-75 Post 1.06 L/sec   FEF2575-%Pred-Post 46 %   FEF2575-%Change-Post 78 %   RV 2.84 L   RV % pred 141 %   TLC 5.32 L   TLC % pred 105 %   DLCO unc 13.35 ml/min/mmHg   DLCO unc % pred 55 %   DL/VA 3.06 ml/min/mmHg/L   DL/VA % pred 63 %  IBC panel     Status: Abnormal   Collection Time: 05/26/17  4:21 PM    Result Value Ref Range   Iron 31 (L) 42 - 145 ug/dL   Transferrin 324.0 212.0 - 360.0 mg/dL   Saturation Ratios 6.8 (L) 20.0 - 50.0 %  CBC with Differential/Platelet     Status: Abnormal   Collection Time: 05/26/17  4:21 PM  Result Value Ref Range   WBC 10.3 4.0 - 10.5 K/uL   RBC 4.01 3.87 - 5.11 Mil/uL   Hemoglobin 9.6 (L) 12.0 - 15.0 g/dL   HCT 30.4 (L) 36.0 - 46.0 %   MCV 75.7 (L) 78.0 - 100.0 fl   MCHC 31.6 30.0 - 36.0 g/dL   RDW 21.8 (H) 11.5 - 15.5 %   Platelets 242.0 150.0 - 400.0 K/uL   Neutrophils Relative % 54.9 43.0 - 77.0 %   Lymphocytes Relative 27.1 12.0 - 46.0 %   Monocytes Relative 7.7 3.0 - 12.0 %   Eosinophils Relative 8.7 (H) 0.0 - 5.0 %   Basophils Relative 1.6 0.0 - 3.0 %   Neutro Abs 5.7 1.4 - 7.7 K/uL   Lymphs Abs 2.8 0.7 - 4.0 K/uL   Monocytes Absolute 0.8 0.1 - 1.0 K/uL   Eosinophils Absolute 0.9 (H) 0.0 - 0.7 K/uL   Basophils Absolute 0.2 (H) 0.0 - 0.1 K/uL  HM MAMMOGRAPHY     Status: None   Collection Time: 05/27/17 12:00 AM  Result Value Ref Range   HM Mammogram  0-4 Bi-Rad 0-4 Bi-Rad, Self Reported Normal  HM DEXA SCAN     Status: None   Collection Time: 06/12/17 12:00 AM  Result Value Ref Range   HM Dexa Scan Osteopenia     Assessment/Plan: Anxiety and depression Patient doing well currently. CSC updated today. Medications refilled. Will follow-up every 6 months as long as this remains stable. RTC sooner if needed.   Dysphagia Mild sore throat since EGD. No sign of bacterial infection. Supportive measures reviewed with patient. This should start easing up. Notes more longstanding issue with swallowing that has not been discussed with GI. Will reach out to specialist to discuss and so they can determine need for swallow study giving EGD negative for stricture, etc.     [name], PA-C

## 2017-08-18 NOTE — Patient Instructions (Signed)
Please keep hydrated and get plenty of rest.  I am going to reach out to Dr. Ardis Hughs to discuss potential for a swallow study to further assess these ongoing symptoms.  Continue care as directed by your specialists. Follow-up with Dr. Melvyn Novas next week as scheduled.   I am sending in refills of your medications. Follow-up in 3 months.

## 2017-08-19 ENCOUNTER — Telehealth: Payer: Self-pay | Admitting: Internal Medicine

## 2017-08-19 ENCOUNTER — Telehealth: Payer: Self-pay | Admitting: Gastroenterology

## 2017-08-19 MED ORDER — BUDESONIDE-FORMOTEROL FUMARATE 160-4.5 MCG/ACT IN AERO: 2.0000 | INHALATION_SPRAY | Freq: Two times a day (BID) | RESPIRATORY_TRACT | 3 refills | Status: DC

## 2017-08-19 NOTE — Telephone Encounter (Signed)
Pt called to inform that she changed pharmacies, she now uses [facility] located in [place].

## 2017-08-19 NOTE — Telephone Encounter (Signed)
Spoke with pt, has changed pharmacies to [facility].  Pt verified that she does not need any refills at this time.  Pharmacy updated in [facility] chart.  Nothing further needed.

## 2017-08-19 NOTE — Telephone Encounter (Signed)
Pharmacy was changed in [place]

## 2017-08-19 NOTE — Telephone Encounter (Signed)
Pt requesting a refill on symbicort.  This has been sent to preferred pharmacy.  Nothing further needed.

## 2017-08-20 DIAGNOSIS — R1310 Dysphagia, unspecified: Secondary | ICD-10-CM | POA: Insufficient documentation

## 2017-08-20 NOTE — Assessment & Plan Note (Addendum)
Mild sore throat since EGD. No sign of bacterial infection. Supportive measures reviewed with patient. This should start easing up. Notes more longstanding issue with swallowing that has not been discussed with GI. Will reach out to specialist to discuss and so they can determine need for swallow study giving EGD negative for stricture, etc.

## 2017-08-20 NOTE — Assessment & Plan Note (Signed)
Patient doing well currently. CSC updated today. Medications refilled. Will follow-up every 6 months as long as this remains stable. RTC sooner if needed.

## 2017-08-25 ENCOUNTER — Ambulatory Visit: Payer: BLUE CROSS/BLUE SHIELD | Admitting: Internal Medicine

## 2017-08-26 ENCOUNTER — Encounter: Payer: Self-pay | Admitting: Internal Medicine

## 2017-08-26 ENCOUNTER — Ambulatory Visit (INDEPENDENT_AMBULATORY_CARE_PROVIDER_SITE_OTHER): Payer: BLUE CROSS/BLUE SHIELD | Admitting: Internal Medicine

## 2017-08-26 ENCOUNTER — Other Ambulatory Visit (INDEPENDENT_AMBULATORY_CARE_PROVIDER_SITE_OTHER): Payer: BLUE CROSS/BLUE SHIELD

## 2017-08-26 VITALS — BP 118/72 | HR 116 | Ht 62.25 in | Wt 257.0 lb

## 2017-08-26 DIAGNOSIS — R05 Cough: Secondary | ICD-10-CM | POA: Diagnosis not present

## 2017-08-26 DIAGNOSIS — F17210 Nicotine dependence, cigarettes, uncomplicated: Secondary | ICD-10-CM | POA: Diagnosis not present

## 2017-08-26 DIAGNOSIS — J449 Chronic obstructive pulmonary disease, unspecified: Secondary | ICD-10-CM | POA: Diagnosis not present

## 2017-08-26 DIAGNOSIS — R058 Other specified cough: Secondary | ICD-10-CM

## 2017-08-26 DIAGNOSIS — D649 Anemia, unspecified: Secondary | ICD-10-CM | POA: Diagnosis not present

## 2017-08-26 LAB — CBC WITH DIFFERENTIAL/PLATELET
BASOS ABS: 0.1 10*3/uL (ref 0.0–0.1)
Basophils Relative: 0.9 % (ref 0.0–3.0)
Eosinophils Absolute: 0.3 10*3/uL (ref 0.0–0.7)
Eosinophils Relative: 3.4 % (ref 0.0–5.0)
HCT: 32 % — ABNORMAL LOW (ref 36.0–46.0)
Hemoglobin: 9.9 g/dL — ABNORMAL LOW (ref 12.0–15.0)
Lymphocytes Relative: 26.9 % (ref 12.0–46.0)
Lymphs Abs: 2.2 10*3/uL (ref 0.7–4.0)
MCHC: 31.1 g/dL (ref 30.0–36.0)
MCV: 79.9 fl (ref 78.0–100.0)
MONO ABS: 0.6 10*3/uL (ref 0.1–1.0)
MONOS PCT: 7.2 % (ref 3.0–12.0)
NEUTROS ABS: 5 10*3/uL (ref 1.4–7.7)
NEUTROS PCT: 61.6 % (ref 43.0–77.0)
PLATELETS: 234 10*3/uL (ref 150.0–400.0)
RBC: 4.01 Mil/uL (ref 3.87–5.11)
RDW: 25.9 % — ABNORMAL HIGH (ref 11.5–15.5)
WBC: 8.2 10*3/uL (ref 4.0–10.5)

## 2017-08-26 MED ORDER — GLYCOPYRROLATE-FORMOTEROL 9-4.8 MCG/ACT IN AERO: 2.0000 | INHALATION_SPRAY | Freq: Two times a day (BID) | RESPIRATORY_TRACT | 0 refills | Status: DC

## 2017-08-26 NOTE — Patient Instructions (Addendum)
Plan A = Automatic = Bevespi Take 2 puffs first thing in am and then another 2 puffs about 12 hours later.   Work on maintaining perfect inhaler technique:  relax and gently blow all the way out then take a nice smooth deep breath back in, triggering the inhaler at same time you start breathing in.  Hold for up to 5 seconds if you can.  . Rinse and gargle with water when done     Plan B = Backup Only use your albuterol as a rescue medication to be used if you can't catch your breath by resting or doing a relaxed purse lip breathing pattern.  - The less you use it, the better it will work when you need it. - Ok to use the inhaler up to 2 puffs  every 4 hours if you must but call for appointment if use goes up over your usual need - Don't leave home without it !!  (think of it like the spare tire for your car)   Plan C = Crisis - only use your albuterol nebulizer if you first try Plan B and it fails to help > ok to use the nebulizer up to every 4 hours but if start needing it regularly call for immediate appointment   The key is to stop smoking completely before smoking completely stops you!    Omeprazole  Take 30- 60 min before your first and last meals of the day and zantac 150 x 2 at bedtime   If hoarseness not better you will need to see ENT but let Einar Pheasant co-ordinate for you    Please schedule a follow up visit in 3 months but call sooner if needed

## 2017-08-26 NOTE — Progress Notes (Signed)
Subjective:     Patient ID: [name], female   DOB: 22-Sep-1955,    MRN: [id number]   Brief patient profile: 89 yowf active smoker with h/o sinus problems in her 19s for which saw [facility] allergist on shots for ? How long didn't seem to help and tendency to more severe sinus problems through the years esp fall/winter and then started with sob around 2016/17 and started 02 at hs then got placed on symbicort late in 2018 and referred to pulmonary clinic 03/18/2017 by Dr   Scotty Court with documented GOLD II 05/26/2017 with min reversibility    History of Present Illness  03/18/2017 1st [facility] office visit/ Wert  Sob since 2016 with min airflow on spirometry 03/18/2017 on symb 160 Take 2 puffs first thing in am and then another 2 puffs about 12 hours later.       History of Present Illness  Chief Complaint  Patient presents with  . Pulmonary Consult    Referred by Suann Larry, NP. Pt states dxed with COPD 2 yrs ago and her breathing has been gradually worsening over the past 6 months. She states she gets SOB with doing housework, and also sometimes just at rest. She does not have a rescue inhaler. She has occ cough with grey sputum.    nasal congestion is better on flonase / symbicort >>  cough is also  some better but breathing is not  Doe across the room "sounds like a bear" = MMRC4  = sob if tries to leave home or while getting dressed   Sleeps flat on back on 2lpm wake up feeling fine / no am cough congestion Overt hb despite ppi daily  rec Change prilosec to 20 mg x 2 x 30 min before first meal of the day and zantac at bedime  GERD (REFLUX)  Diet  Try adding spiriva 1.25 x 4 puff each am until use up sample and if it helps then start spiriva 2.5 x 2 pffs each am Work on inhaler technique:   Please schedule a follow up office visit in 6 weeks, call sooner if needed with full pfts  - add rec iron profile for microcytic anemia     05/26/2017  f/u ov/Wert re:  Copd gold  II Chief Complaint  Patient presents with  . Follow-up    PFT's done today. Breathing is gradually worse and she is coughing more. Her cough is non prod and mainly bothers her at night.    Dyspnea:  80 ft across house and stops = MMRC3 = can't walk 100 yards even at a slow pace at a flat grade s stopping due to sob  Cough: non productive esp when head flat on one pillow SABA use: none  Sleeping   flat on 2lpm no am excess mucus   Says having overt hb despite ppi bid and h2 hs  rec .Although I don't endorse regular use of e cigs/ many pts find them helpful; however, I emphasized they should be considered a "one-way bridge" off all tobacco products.  Plan A = Automatic = symbicort and spiriva as you are  Plan B = Backup Only use your albuterol as a rescue medication Please remember to go to the lab department downstairs in the basement  for your tests - we will call you with the results when they are available. Please schedule a follow up visit in 3 months but call sooner if needed  - add try off mobic due to ?  Gi side effects to see if they get better or arthritis paint can't be controlled with just tylenol      08/26/2017  f/u ov/Wert re:  GOLD II still smoking/ having trouble affording symb and spiriva  Chief Complaint  Patient presents with  . Follow-up    Increased cough, wheezing, hoarseness, and SOB for the past month. She is coughing up grey sputum.  She rarely uses her albuterol inhaler.   Dyspnea:  Room to room  Cough: first thing in am/ assoc with hoarseness and overt HB  SABA use: rarely  02: hs x 2lpm    No obvious day to day or daytime variability or assoc excess/ purulent sputum or mucus plugs or hemoptysis or cp or chest tightness, subjective wheeze or overt sinus   symptoms.   Sleeping: flat x 2 pillows/2lpm  without nocturnal  or early am exacerbation  of respiratory  c/o's or need for noct saba. Also denies any obvious fluctuation of symptoms with weather or  environmental changes or other aggravating or alleviating factors except as outlined above   No unusual exposure hx or h/o childhood pna/ asthma or knowledge of premature birth.  Current Allergies, Complete Past Medical History, Past Surgical History, Family History, and Social History were reviewed in [employer] record.  ROS  The following are not active complaints unless bolded Hoarseness, sore throat, dysphagia, dental problems, itching, sneezing,  nasal congestion or discharge of excess mucus or purulent secretions, ear ache,   fever, chills, sweats, unintended wt loss or wt gain, classically pleuritic or exertional cp,  orthopnea pnd or arm/hand swelling  or leg swelling, presyncope, palpitations, abdominal pain, anorexia, nausea, vomiting, diarrhea  or change in bowel habits or change in bladder habits, change in stools or change in urine, dysuria, hematuria,  rash, arthralgias, visual complaints, headache, numbness, weakness or ataxia or problems with walking or coordination,  change in mood or  memory.        No outpatient medications have been marked as taking for the 08/26/17 encounter (Office Visit) with Tanda Rockers, MD.                       Objective:   Physical Exam  Chronically ill amb hoarse wf nad   08/26/2017        257  05/26/2017       252   03/18/17 245 lb (111.1 kg)  09/02/16 245 lb 13 oz (111.5 kg)  08/29/16 228 lb 9.6 oz (103.7 kg)      Vital signs reviewed - Note on arrival 02 sats  93% on RA         HEENT: nl dentition / oropharynx. Nl external ear canals without cough reflex - mild bilateral non-specific turbinate edema     NECK :  without JVD/Nodes/TM/ nl carotid upstrokes bilaterally   LUNGS: no acc muscle use,  Mild barrel  contour chest wall with bilateral  Distant bs s audible wheeze and  without cough on insp or exp maneuver and mild   Hyperresonant  to  percussion bilaterally     CV:  RRR  no s3 or murmur or  increase in P2, and no edema   ABD:  Obese soft and nontender with pos late insp Hoover's  in the supine position. No bruits or organomegaly appreciated, bowel sounds nl  MS:   Nl gait/  ext warm without deformities, calf tenderness, cyanosis or clubbing No obvious joint restrictions  SKIN: warm and dry without lesions    NEURO:  alert, approp, nl sensorium with  no motor or cerebellar deficits apparent.                Assessment:

## 2017-08-27 ENCOUNTER — Other Ambulatory Visit: Payer: Self-pay

## 2017-08-27 DIAGNOSIS — D649 Anemia, unspecified: Secondary | ICD-10-CM

## 2017-08-28 DIAGNOSIS — F341 Dysthymic disorder: Secondary | ICD-10-CM | POA: Diagnosis not present

## 2017-08-30 ENCOUNTER — Encounter: Payer: Self-pay | Admitting: Internal Medicine

## 2017-08-30 DIAGNOSIS — R058 Other specified cough: Secondary | ICD-10-CM | POA: Insufficient documentation

## 2017-08-30 DIAGNOSIS — R05 Cough: Secondary | ICD-10-CM | POA: Insufficient documentation

## 2017-08-30 NOTE — Assessment & Plan Note (Addendum)
Max rx for gerd 08/26/2017 then rec ent f/u if not better> defer referral to PCP if needed for hoarseness   Upper airway cough syndrome (previously labeled PNDS),  is so named because it's frequently impossible to sort out how much is  CR/sinusitis with freq throat clearing (which can be related to primary GERD)   vs  causing  secondary (" extra esophageal")  GERD from wide swings in gastric pressure that occur with throat clearing, often  promoting self use of mint and menthol lozenges that reduce the lower esophageal sphincter tone and exacerbate the problem further in a cyclical fashion.   These are the same pts (now being labeled as having "irritable larynx syndrome" by some cough centers) who not infrequently have a history of having failed to tolerate ace inhibitors,  dry powder inhalers or biphosphonates or report having atypical/extraesophageal reflux symptoms that don't respond to standard doses of PPI  and are easily confused as having aecopd or asthma flares by even experienced allergists/ pulmonologists (myself included).    For now max rx for gerd reviewed- see avs for instructions unique to this ov

## 2017-08-30 NOTE — Assessment & Plan Note (Signed)

## 2017-08-30 NOTE — Assessment & Plan Note (Signed)
Body mass index is 46.63 kg/m.  -  trending up still  Lab Results  Component Value Date   TSH 4.37 03/18/2017     Contributing to gerd risk/ doe/reviewed the need and the process to achieve and maintain neg calorie balance > defer f/u primary care including intermittently monitoring thyroid status

## 2017-08-30 NOTE — Assessment & Plan Note (Signed)
Spirometry 03/18/2017  FEV1 1.54 (66%)  Ratio 75 with min curvature p am symb - 03/18/2017   Walked RA x one lap @ 185 stopped due to  Sob nl pace, no desats - 03/18/2017  After extensive coaching inhaler device  effectiveness =    90% with smi > try adding spiriva respimat and return in 6 weeks for pfts off all rx   - Allergy profile 03/18/17 >  Eos 0.3 /  IgE  4 neg RAST  - PFT's  05/26/2017  FEV1 1.60 (63 % ) ratio 67  p 14 % improvement from saba p sym/ spriva prior to study with DLCO  55 % corrects to 63  % for alv volume with EOS up to 0.8  - 08/26/2017  After extensive coaching inhaler device  effectiveness =    75% change to bevespi    I believe most of her present symptoms are upper airway ? gerd related and Pt is Group B in terms of symptom/risk and laba/lama therefore appropriate rx at this point > bevespi should be free under her present insurance so is worth a try along with working harder to quit smoking (see separate a/p)    I had an extended discussion with the patient reviewing all relevant studies completed to date and  lasting 15 to 20 minutes of a 25 minute visit    See device teaching which extended face to face time for this visit.  Each maintenance medication was reviewed in detail including emphasizing most importantly the difference between maintenance and prns and under what circumstances the prns are to be triggered using an action plan format that is not reflected in the computer generated alphabetically organized AVS which I have not found useful in most complex patients, especially with respiratory illnesses  Please see AVS for specific instructions unique to this visit that I personally wrote and verbalized to the the pt in detail and then reviewed with pt  by my nurse highlighting any  changes in therapy recommended at today's visit to their plan of care.

## 2017-09-04 DIAGNOSIS — F341 Dysthymic disorder: Secondary | ICD-10-CM | POA: Diagnosis not present

## 2017-09-16 ENCOUNTER — Other Ambulatory Visit: Payer: Self-pay | Admitting: Physician Assistant

## 2017-09-16 NOTE — Telephone Encounter (Signed)
Copied from [place] [id number]. Topic: Quick Communication - See Telephone Encounter >> Sep 16, 2017 11:31 AM Mylinda Latina, NT wrote: CRM for notification. See Telephone encounter for: 09/16/17. Patient called and states that she needs a refill of her HYDROcodone-acetaminophen [facility]) 10-325 MG tablet  [facility], [place], [place] - [place] [phone] (Phone) [phone] (Fax)

## 2017-09-16 NOTE — Telephone Encounter (Signed)
Refill of hydrocodone  LOV 08/18/17 Teresita Madura  [facility] 07/18/17  #90  0 refills  [facility], [facility]

## 2017-09-16 NOTE — Telephone Encounter (Signed)
[place] 08/21/17 for Xanax NO UDS  Please advise

## 2017-09-17 MED ORDER — HYDROCODONE-ACETAMINOPHEN 10-325 MG PO TABS: 1.0000 | ORAL_TABLET | Freq: Three times a day (TID) | ORAL | 0 refills | Status: DC

## 2017-09-22 ENCOUNTER — Other Ambulatory Visit: Payer: Self-pay

## 2017-09-22 MED ORDER — RANITIDINE HCL 150 MG PO TABS: 150.0000 mg | ORAL_TABLET | Freq: Two times a day (BID) | ORAL | 2 refills | Status: DC

## 2017-09-22 NOTE — Telephone Encounter (Signed)
  Ranitidine refilled as pharmacy requested.

## 2017-10-02 DIAGNOSIS — F341 Dysthymic disorder: Secondary | ICD-10-CM | POA: Diagnosis not present

## 2017-10-07 ENCOUNTER — Other Ambulatory Visit: Payer: Self-pay | Admitting: Physician Assistant

## 2017-10-07 NOTE — Telephone Encounter (Signed)
Last OV 08/18/17, Next OV 11/24/17  Last filled 08/18/17, # 90 with 1 refill

## 2017-10-15 ENCOUNTER — Other Ambulatory Visit: Payer: Self-pay | Admitting: Emergency Medicine

## 2017-10-15 MED ORDER — ALPRAZOLAM 1 MG PO TABS: 1.0000 mg | ORAL_TABLET | Freq: Three times a day (TID) | ORAL | 1 refills | Status: DC | PRN

## 2017-10-15 NOTE — Telephone Encounter (Signed)
Last rx for Xanax filled 08/18/17 #90 1RF [facility]: 08/28/17 No UDS  Please advise   Copied from [place] #[id number]. Topic: Quick Communication - Rx Refill/Question >> Oct 15, 2017 11:08 AM Carolyn Stare wrote: Medication   ALPRAZolam Duanne Moron) 1 MG tablet    pt said she is due 10/18/17    Preferred Pharmacy  [facility]   Agent: Please be advised that RX refills may take up to 3 business days. We ask that you follow-up with your pharmacy.   Pt wanted Einar Pheasant to know they admitted her husband to the hospital

## 2017-11-04 ENCOUNTER — Other Ambulatory Visit: Payer: Self-pay | Admitting: Physician Assistant

## 2017-11-04 MED ORDER — HYDROCODONE-ACETAMINOPHEN 10-325 MG PO TABS: 1.0000 | ORAL_TABLET | Freq: Three times a day (TID) | ORAL | 0 refills | Status: DC

## 2017-11-04 NOTE — Telephone Encounter (Signed)
Copied from [place] [id number]. Topic: Quick Communication - Rx Refill/Question >> Nov 04, 2017  8:48 AM Margot Ables wrote: Medication: HYDROcodone-acetaminophen (NORCO) 10-325 MG tablet - pt has 3 pills left - normally takes 2-3 day - advised to call 3-5 days ahead of time in the future - pt states her husband has been in the hospital for some foot issues and apologized  Has the patient contacted their pharmacy? No - controlled Preferred Pharmacy (with phone number or street name): [facility], [place], [place] [phone] (Phone) [phone] (Fax)  Last OV 08/18/17 Last refill 09/17/17 #90 0 refills

## 2017-11-05 ENCOUNTER — Other Ambulatory Visit: Payer: Self-pay | Admitting: Emergency Medicine

## 2017-11-05 MED ORDER — POTASSIUM CHLORIDE ER 10 MEQ PO TBCR: 10.0000 meq | EXTENDED_RELEASE_TABLET | Freq: Every day | ORAL | 1 refills | Status: DC

## 2017-11-11 ENCOUNTER — Telehealth: Payer: Self-pay | Admitting: Gastroenterology

## 2017-11-11 NOTE — Telephone Encounter (Signed)
Pt called to inform that omeprazole requires PA, her pharmacy told her that they faxed PA form. Pt wanted to know if we received it and if we can expedite process as she is out of med.

## 2017-11-13 NOTE — Telephone Encounter (Signed)
PA has been sent to insurance. Patient notified. She is using OTC omeprazole at this time.

## 2017-11-24 ENCOUNTER — Encounter: Payer: Self-pay | Admitting: Physician Assistant

## 2017-11-24 ENCOUNTER — Other Ambulatory Visit: Payer: Self-pay | Admitting: Physician Assistant

## 2017-11-24 ENCOUNTER — Other Ambulatory Visit: Payer: Self-pay

## 2017-11-24 ENCOUNTER — Ambulatory Visit (INDEPENDENT_AMBULATORY_CARE_PROVIDER_SITE_OTHER): Payer: Managed Care, Other (non HMO) | Admitting: Physician Assistant

## 2017-11-24 VITALS — BP 100/70 | HR 80 | Temp 98.2°F | Resp 16 | Ht 62.0 in | Wt 258.0 lb

## 2017-11-24 DIAGNOSIS — E785 Hyperlipidemia, unspecified: Secondary | ICD-10-CM | POA: Diagnosis not present

## 2017-11-24 DIAGNOSIS — F329 Major depressive disorder, single episode, unspecified: Secondary | ICD-10-CM

## 2017-11-24 DIAGNOSIS — F419 Anxiety disorder, unspecified: Secondary | ICD-10-CM

## 2017-11-24 DIAGNOSIS — R490 Dysphonia: Secondary | ICD-10-CM | POA: Diagnosis not present

## 2017-11-24 DIAGNOSIS — R1310 Dysphagia, unspecified: Secondary | ICD-10-CM

## 2017-11-24 DIAGNOSIS — E039 Hypothyroidism, unspecified: Secondary | ICD-10-CM | POA: Diagnosis not present

## 2017-11-24 DIAGNOSIS — G894 Chronic pain syndrome: Secondary | ICD-10-CM | POA: Diagnosis not present

## 2017-11-24 MED ORDER — FLUOXETINE HCL 40 MG PO CAPS: 80.0000 mg | ORAL_CAPSULE | Freq: Every day | ORAL | 3 refills | Status: DC

## 2017-11-24 MED ORDER — PANTOPRAZOLE SODIUM 40 MG PO TBEC
40.0000 mg | DELAYED_RELEASE_TABLET | Freq: Two times a day (BID) | ORAL | 3 refills | Status: DC
Start: 2017-11-24 — End: 2017-12-03

## 2017-11-24 NOTE — Progress Notes (Signed)
Patient presents to clinic today for follow-up.  Patient is currently on a regimen of Sertraline 200 mg daily for anxiety and depression. Has been on Sertraline for some time. Now feels it is not helping with mood. Notes some anxiety but mostly depressed mood and feeling of withdrawn. Denies SI/HI. Also notes fatigue. Is taking her levothyroxine as directed. Would like to discuss other options for treatment of mood.  Patient also with ongoing GERD and dysphagia despite Prilosec 40 mg BID. Is followed by GI (Dr. Ardis Hughs), having an EGD and colonoscopy earlier in the year. Denies choking sensation. Has been dealing with ongoing hoarseness as well. Is not sure if this is related or from another cause. Sees Pulmonology for COPD. Was recommend to have ENT assessment as well due to hoarseness.      Past Medical History:  Diagnosis Date  . Allergic rhinitis   . Anemia   . Anxiety   . Chicken pox   . Chronic back pain   . COPD (chronic obstructive pulmonary disease) ([place])   . Depression   . Essential hypertension   . GERD (gastroesophageal reflux disease)   . Headache    migraines  . History of gastritis    EGD 2015  . History of home oxygen therapy    2 liters at hs last 6 months  . Hyperlipidemia   . Hypothyroidism   . Migraines   . Osteoarthritis    oa  . Scoliosis     Current Outpatient Medications on File Prior to Visit  Medication Sig Dispense Refill  . albuterol (PROAIR HFA) 108 (90 Base) MCG/ACT inhaler 2 puffs every 4 hours as needed only  if your can't catch your breath (Patient taking differently: Inhale 2 puffs into the lungs every 4 (four) hours as needed for wheezing or shortness of breath. ) 1 Inhaler 1  . ALPRAZolam (XANAX) 1 MG tablet Take 1 tablet (1 mg total) by mouth 3 (three) times daily as needed for anxiety. 90 tablet 1  . atorvastatin (LIPITOR) 20 MG tablet Take 20 mg by mouth at bedtime.     . ferrous sulfate 325 (65 FE) MG EC tablet Take 325 mg by mouth 2  (two) times daily.    . fluticasone (FLONASE) 50 MCG/ACT nasal spray Place 2 sprays into both nostrils daily.    Marland Kitchen gabapentin (NEURONTIN) 300 MG capsule Take 600 mg by mouth 3 times daily    . Glycopyrrolate-Formoterol (BEVESPI AEROSPHERE) 9-4.8 MCG/ACT AERO Inhale 2 puffs into the lungs 2 (two) times daily. 1 Inhaler 0  . HYDROcodone-acetaminophen (NORCO) 10-325 MG tablet Take 1 tablet by mouth 3 (three) times daily. 90 tablet 0  . levothyroxine (SYNTHROID, LEVOTHROID) 75 MCG tablet Take 1 tablet (75 mcg total) by mouth daily before breakfast. 30 tablet 3  . loratadine (CLARITIN) 10 MG tablet Take 10 mg by mouth daily.    . ondansetron (ZOFRAN) 4 MG tablet Take 1 pill 1 hour prior to both doses of colon prep (Patient taking differently: Take 4 mg by mouth daily as needed for nausea or vomiting. ) 2 tablet 0  . OXYGEN Inhale 2 L into the lungs at bedtime.     . polyethylene glycol-electrolytes (NULYTELY/GOLYTELY) 420 g solution Take 4,000 mLs by mouth as directed. 4000 mL 0  . potassium chloride (K-DUR) 10 MEQ tablet Take 1 tablet (10 mEq total) by mouth at bedtime. 90 tablet 1  . ranitidine (ZANTAC) 150 MG tablet Take 1 tablet (150 mg total) by  mouth 2 (two) times daily. 180 tablet 2  . triamterene-hydrochlorothiazide (DYAZIDE) 37.5-25 MG capsule Take 1 each (1 capsule total) by mouth daily. 90 capsule 0   No current facility-administered medications on file prior to visit.     Allergies  Allergen Reactions  . Nsaids Diarrhea  . Aleve [Naproxen Sodium] Other (See Comments)    Headache   . Codeine Nausea Only and Other (See Comments)    GI upset  . Penicillins Nausea Only and Other (See Comments)    GI upset Has patient had a PCN reaction causing immediate rash, facial/tongue/throat swelling, SOB or lightheadedness with hypotension: No Has patient had a PCN reaction causing severe rash involving mucus membranes or skin necrosis: No Has patient had a PCN reaction that required  hospitalization No Has patient had a PCN reaction occurring within the last 10 years: No If all of the above answers are "NO", then may proceed with Cephalosporin use.   . Sulfonamide Derivatives Hives    Family History  Problem Relation Age of Onset  . COPD Mother   . Heart disease Mother   . Lung disease Father        Asbestosis  . Heart attack Father   . Heart disease Father   . Cerebral aneurysm Brother   . Aneurysm Brother        Brain  . Epilepsy Son   . Arthritis Maternal Grandmother   . Heart disease Maternal Grandmother   . Asthma Maternal Grandfather   . Cancer Maternal Grandfather   . Arthritis Paternal Grandmother   . Heart disease Paternal Grandmother   . Stroke Paternal Grandmother   . Early death Paternal Grandfather   . Heart disease Paternal Grandfather     Social History   Socioeconomic History  . Marital status: Married    Spouse name: Not on file  . Number of children: Not on file  . Years of education: Not on file  . Highest education level: Not on file  Occupational History  . Not on file  Social Needs  . Financial resource strain: Not on file  . Food insecurity:    Worry: Not on file    Inability: Not on file  . Transportation needs:    Medical: Not on file    Non-medical: Not on file  Tobacco Use  . Smoking status: Current Every Day Smoker    Packs/day: 1.50    Years: 46.00    Pack years: 69.00    Types: Cigarettes  . Smokeless tobacco: Never Used  Substance and Sexual Activity  . Alcohol use: No  . Drug use: No  . Sexual activity: Yes  Lifestyle  . Physical activity:    Days per week: Not on file    Minutes per session: Not on file  . Stress: Not on file  Relationships  . Social connections:    Talks on phone: Not on file    Gets together: Not on file    Attends religious service: Not on file    Active member of club or organization: Not on file    Attends meetings of clubs or organizations: Not on file    Relationship  status: Not on file  Other Topics Concern  . Not on file  Social History Narrative  . Not on file   Review of Systems - See HPI.  All other ROS are negative.  BP 100/70   Pulse 80   Temp 98.2 F (36.8 C) (Oral)   Resp  16   Ht 5' 2"  (1.575 m)   Wt 258 lb (117 kg)   SpO2 97%   BMI 47.19 kg/m   Physical Exam  Constitutional: She appears well-developed and well-nourished.  HENT:  Head: Normocephalic and atraumatic.  Right Ear: External ear normal.  Left Ear: External ear normal.  Eyes: Pupils are equal, round, and reactive to light. Conjunctivae are normal.  Cardiovascular: Normal rate, regular rhythm, normal heart sounds and intact distal pulses.  Pulmonary/Chest: Effort normal and breath sounds normal.  Psychiatric: She has a normal mood and affect.  Vitals reviewed.    Assessment/Plan: 1. Hypothyroidism, unspecified type Will recheck TSH today giving fatigue. Continue current levothyroxine dose for now. Will alter based on lab results.  - TSH  2. Dysphagia, unspecified type Along with GERD symptoms. Followed by GI currently on Prilosec 40 mg BID. Will switch to a trial of Protonix 40 mg BID to see if this improves. Patient to schedule follow-up with GI.  3. Hyperlipidemia, unspecified hyperlipidemia type Overdue for repeat CMP today. Will check labs. Recent Lipids within range. - Comp Met (CMET)  4. Hoarseness Chronic. Suspect 2/2 GERD but other causes need assessment. Referral to ENT placed.  - Ambulatory referral to ENT  5. Anxiety and depression Deteriorated. Will stop Sertraline 200 mg QD and switch to Fluoxetine 80 mg QD. Continue benzo for acute anxiety only. Supportive measures reviewed. Follow-up 1 month.  6. Chronic pain syndrome Stable. Continue current medications.   [name], PA-C

## 2017-11-24 NOTE — Patient Instructions (Signed)
Please go to the lab today for blood work.  I will call you with your results. We will alter treatment regimen(s) if indicated by your results.   Stop the Sertraline and start the Fluoxetine as directed. Follow-up with me in 1 month for reassessment.  Limit overhead lifting. Start a heating pad and Icy Hot to the shoulder. Symptoms should slowly ease up. Continue chronic pain medication.   Start the Pantoprazole twice daily instead of the Omeprazole to see if this works better. I am setting you up with ENT for further assessment.

## 2017-11-25 LAB — COMPREHENSIVE METABOLIC PANEL
ALT: 24 U/L (ref 0–35)
AST: 31 U/L (ref 0–37)
Albumin: 3.7 g/dL (ref 3.5–5.2)
Alkaline Phosphatase: 60 U/L (ref 39–117)
BUN: 16 mg/dL (ref 6–23)
CHLORIDE: 102 meq/L (ref 96–112)
CO2: 30 meq/L (ref 19–32)
Calcium: 8.7 mg/dL (ref 8.4–10.5)
Creatinine, Ser: 1.19 mg/dL (ref 0.40–1.20)
GFR: 48.88 mL/min — AB (ref >60.00)
GLUCOSE: 88 mg/dL (ref 70–99)
Potassium: 3.7 mEq/L (ref 3.5–5.1)
SODIUM: 138 meq/L (ref 135–145)
Total Bilirubin: 0.3 mg/dL (ref 0.2–1.2)
Total Protein: 7 g/dL (ref 6.0–8.3)

## 2017-11-25 LAB — TSH: TSH: 4.11 u[IU]/mL (ref 0.35–4.50)

## 2017-11-26 ENCOUNTER — Encounter: Payer: Self-pay | Admitting: Emergency Medicine

## 2017-11-26 ENCOUNTER — Ambulatory Visit: Payer: BLUE CROSS/BLUE SHIELD | Admitting: Internal Medicine

## 2017-11-26 DIAGNOSIS — K2100 Gastro-esophageal reflux disease with esophagitis, without bleeding: Secondary | ICD-10-CM | POA: Insufficient documentation

## 2017-11-28 ENCOUNTER — Other Ambulatory Visit: Payer: Self-pay | Admitting: Physician Assistant

## 2017-11-28 MED ORDER — LEVOTHYROXINE SODIUM 75 MCG PO TABS: 75.0000 ug | ORAL_TABLET | Freq: Every day | ORAL | 3 refills | Status: DC

## 2017-11-28 NOTE — Telephone Encounter (Signed)
Medications have been sent

## 2017-11-28 NOTE — Telephone Encounter (Signed)
Copied from [place] [id number]. Topic: Quick Communication - Rx Refill/Question >> Nov 28, 2017 11:55 AM Blase Mess A wrote: Medication: levothyroxine (SYNTHROID, LEVOTHROID) 75 MCG tablet [[id number]]   Has the patient contacted their pharmacy? Yes  (Agent: If no, request that the patient contact the pharmacy for the refill.) (Agent: If yes, when and what did the pharmacy advise?)  Preferred Pharmacy (with phone number or street name): [facility] [place], [place] [address] [phone]  Agent: Please be advised that RX refills may take up to 3 business days. We ask that you follow-up with your pharmacy.

## 2017-12-01 ENCOUNTER — Ambulatory Visit: Payer: Medicare Other | Admitting: Internal Medicine

## 2017-12-02 ENCOUNTER — Other Ambulatory Visit: Payer: Self-pay | Admitting: Physician Assistant

## 2017-12-02 DIAGNOSIS — J449 Chronic obstructive pulmonary disease, unspecified: Secondary | ICD-10-CM

## 2017-12-03 ENCOUNTER — Other Ambulatory Visit: Payer: Self-pay | Admitting: Gastroenterology

## 2017-12-03 ENCOUNTER — Other Ambulatory Visit: Payer: Self-pay | Admitting: Internal Medicine

## 2017-12-03 ENCOUNTER — Other Ambulatory Visit: Payer: Self-pay | Admitting: Physician Assistant

## 2017-12-03 NOTE — Telephone Encounter (Signed)
Last rx 10/15/17 #90 1 RF Please advise of refill

## 2017-12-04 ENCOUNTER — Telehealth: Payer: Self-pay | Admitting: Internal Medicine

## 2017-12-04 NOTE — Telephone Encounter (Signed)
Spoke with Tod Persia and she states the insurance will not pay for  Spiriva. Dr. Melvyn Novas, Spiriva is not covered, can we try Incruse or Anoro?   Patient Instructions by Tanda Rockers, MD at 08/26/2017 11:45 AM  Author: Tanda Rockers, MD Author Type: Physician Filed: 08/26/2017 12:10 PM  Note Status: Addendum Mickle Mallory: Cosign Not Required Encounter Date: 08/26/2017  Editor: Tanda Rockers, MD (Physician)  Prior Versions: 1. Tanda Rockers, MD (Physician) at 08/26/2017 12:06 PM - Addendum   2. Tanda Rockers, MD (Physician) at 08/26/2017 12:05 PM - Signed    Plan A = Automatic = Bevespi Take 2 puffs first thing in am and then another 2 puffs about 12 hours later.   Work on maintaining perfect inhaler technique:  relax and gently blow all the way out then take a nice smooth deep breath back in, triggering the inhaler at same time you start breathing in.  Hold for up to 5 seconds if you can.  . Rinse and gargle with water when done     Plan B = Backup Only use your albuterol as a rescue medication to be used if you can't catch your breath by resting or doing a relaxed purse lip breathing pattern.  - The less you use it, the better it will work when you need it. - Ok to use the inhaler up to 2 puffs  every 4 hours if you must but call for appointment if use goes up over your usual need - Don't leave home without it !!  (think of it like the spare tire for your car)   Plan C = Crisis - only use your albuterol nebulizer if you first try Plan B and it fails to help > ok to use the nebulizer up to every 4 hours but if start needing it regularly call for immediate appointment   The key is to stop smoking completely before smoking completely stops you!    Omeprazole  Take 30- 60 min before your first and last meals of the day and zantac 150 x 2 at bedtime   If hoarseness not better you will need to see ENT but let Einar Pheasant co-ordinate for you    Please schedule a follow up visit in 3 months  but call sooner if needed

## 2017-12-05 ENCOUNTER — Telehealth: Payer: Self-pay | Admitting: Internal Medicine

## 2017-12-05 ENCOUNTER — Other Ambulatory Visit: Payer: Managed Care, Other (non HMO)

## 2017-12-05 ENCOUNTER — Ambulatory Visit (INDEPENDENT_AMBULATORY_CARE_PROVIDER_SITE_OTHER): Payer: Managed Care, Other (non HMO) | Admitting: Adult Health

## 2017-12-05 ENCOUNTER — Encounter: Payer: Self-pay | Admitting: Adult Health

## 2017-12-05 DIAGNOSIS — J449 Chronic obstructive pulmonary disease, unspecified: Secondary | ICD-10-CM | POA: Diagnosis not present

## 2017-12-05 DIAGNOSIS — J441 Chronic obstructive pulmonary disease with (acute) exacerbation: Secondary | ICD-10-CM | POA: Diagnosis not present

## 2017-12-05 DIAGNOSIS — G4736 Sleep related hypoventilation in conditions classified elsewhere: Secondary | ICD-10-CM

## 2017-12-05 MED ORDER — UMECLIDINIUM BROMIDE 62.5 MCG/INH IN AEPB: 1.0000 | INHALATION_SPRAY | Freq: Every day | RESPIRATORY_TRACT | 5 refills | Status: DC

## 2017-12-05 NOTE — Assessment & Plan Note (Signed)
Moderate COPD with increased symptom burden  Smoking cessation discussed  Check HRCT chest to r/o ILD . (increased interstitial marking on chest xray and DLCO defect )  Heavy smoker  Add LAMA . Check alpha 1   Plan  Patient Instructions  Check Alpha 1 test today  Set up for HRCT chest .  Please get copy of sleep study results from [facility] .  Continue on Symbicort 2 puffs Twice daily  , rinse after use.  Begin INCRUSE 1 puff daily . Rinse after use .  Follow up in 4 weeks , bring all your respiratory meds to next visit.  Please contact office for sooner follow up if symptoms do not improve or worsen or seek emergency care

## 2017-12-05 NOTE — Telephone Encounter (Signed)
Ok I will see if Dr. Loanne Drilling will take her as a pt. Dr. Loanne Drilling please advise.

## 2017-12-05 NOTE — Progress Notes (Signed)
@Patient  ID: [name], female    DOB: December 26, 1955, 62 y.o.   MRN: [id number]  Chief Complaint  Patient presents with  . Acute Visit    COPD     Referring provider: Delorse Limber  HPI: 62 year old female smoker seen for initial pulmonary consult February 2019 found to have gold 2 COPD with reversibility and oxygen dependent respiratory failure on nocturnal oxygen at 2 L Is disabled from orthopedic issues.-severe hip issues  Medical hx of Dyslipidemia , HTN , anemia   TEST/EVENTS :  Spirometry 03/18/2017  FEV1 1.54 (66%)  Ratio 75 with min curvature p am symb - Allergy profile 03/18/17 >  Eos 0.3 /  IgE  4 neg RAST  - PFT's  05/26/2017  FEV1 1.60 (63 % ) ratio 67  p 14 % improvement from saba p sym/ spriva prior to study with DLCO  55 % corrects to 63  % for alv volume with EOS up to 0.8  12/05/2017 Acute OV : COPD , O2 RF QHS  Patient presents for an acute office visit.  Patient complains over that breathing has been going down hill for last several months . Has daily cough that is productive with white mucus, gets winded with minimal activity .  She continues to smoke, 1-2 PPD -rolls her own cigs .  Has strong family hx of COPD .  Started on [place] last visit , but did not like it . Returned to Symbicort . Was previously on Spiriva but not covered by [employer]. Insurance will cover [employer] and [employer] .  Previous PFT showed moderate COPD and diffusing defect.  CXR earlier this year showed stable increased pulmonary interstitial markings.   Says she had a sleep study few years ago , was told no sleep apnea. Has daytime sleepiness.   Under a lot of stress, daughter is in prison, been married 5 times , son has seizures.     Allergies  Allergen Reactions  . Nsaids Diarrhea  . Aleve [Naproxen Sodium] Other (See Comments)    Headache   . Codeine Nausea Only and Other (See Comments)    GI upset  . Penicillins Nausea Only and Other (See Comments)    GI upset Has  patient had a PCN reaction causing immediate rash, facial/tongue/throat swelling, SOB or lightheadedness with hypotension: No Has patient had a PCN reaction causing severe rash involving mucus membranes or skin necrosis: No Has patient had a PCN reaction that required hospitalization No Has patient had a PCN reaction occurring within the last 10 years: No If all of the above answers are "NO", then may proceed with Cephalosporin use.   . Sulfonamide Derivatives Hives    Immunization History  Administered Date(s) Administered  . Influenza-Unspecified 01/16/2011, 11/26/2011, 12/22/2012, 11/17/2013    Past Medical History:  Diagnosis Date  . Allergic rhinitis   . Anemia   . Anxiety   . Chicken pox   . Chronic back pain   . COPD (chronic obstructive pulmonary disease) ([facility])   . Depression   . Essential hypertension   . GERD (gastroesophageal reflux disease)   . Headache    migraines  . History of gastritis    EGD 2015  . History of home oxygen therapy    2 liters at hs last 6 months  . Hyperlipidemia   . Hypothyroidism   . Migraines   . Osteoarthritis    oa  . Scoliosis     Tobacco History: Social History  Tobacco Use  Smoking Status Current Every Day Smoker  . Packs/day: 1.50  . Years: 46.00  . Pack years: 69.00  . Types: Cigarettes  Smokeless Tobacco Never Used   Ready to quit: No Counseling given: Yes   Outpatient Medications Prior to Visit  Medication Sig Dispense Refill  . albuterol (PROAIR HFA) 108 (90 Base) MCG/ACT inhaler 2 puffs every 4 hours as needed only  if your can't catch your breath (Patient taking differently: Inhale 2 puffs into the lungs every 4 (four) hours as needed for wheezing or shortness of breath. ) 1 Inhaler 1  . ALPRAZolam (XANAX) 1 MG tablet Take 1 tablet (1 mg total) by mouth 3 (three) times daily as needed for anxiety. 90 tablet 1  . atorvastatin (LIPITOR) 20 MG tablet Take 20 mg by mouth at bedtime.     . ferrous sulfate 325 (65  FE) MG EC tablet Take 325 mg by mouth 2 (two) times daily.    . fluticasone (FLONASE) 50 MCG/ACT nasal spray Place 2 sprays into both nostrils daily.    Marland Kitchen gabapentin (NEURONTIN) 300 MG capsule Take 600 mg by mouth 3 times daily    . HYDROcodone-acetaminophen (NORCO) 10-325 MG tablet Take 1 tablet by mouth 3 (three) times daily. 90 tablet 0  . levothyroxine (SYNTHROID, LEVOTHROID) 75 MCG tablet TAKE 1 TABLET BY MOUTH DAILY BEFORE BREAKFAST 90 tablet 1  . loratadine (CLARITIN) 10 MG tablet Take 10 mg by mouth daily.    Marland Kitchen omeprazole (PRILOSEC) 40 MG capsule TAKE 1 CAPSULE BY MOUTH BEFORE BREAKFAST AND TAKE 1 CAPSULE BY MOUTH BEFORE DINNER 60 capsule 3  . ondansetron (ZOFRAN) 4 MG tablet Take 1 pill 1 hour prior to both doses of colon prep (Patient taking differently: Take 4 mg by mouth daily as needed for nausea or vomiting. ) 2 tablet 0  . OXYGEN Inhale 2 L into the lungs at bedtime.     . potassium chloride (K-DUR,KLOR-CON) 10 MEQ tablet TAKE 1 TABLET BY MOUTH AT BEDTIME 90 tablet 1  . ranitidine (ZANTAC) 150 MG tablet Take 1 tablet (150 mg total) by mouth 2 (two) times daily. 180 tablet 2  . sertraline (ZOLOFT) 100 MG tablet TAKE TWO TABLETS BY MOUTH AT BEDTIME 180 tablet 1  . SYMBICORT 160-4.5 MCG/ACT inhaler INHALE TWO PUFFS BY MOUTH TWICE DAILY 10.2 g 0  . triamterene-hydrochlorothiazide (DYAZIDE) 37.5-25 MG capsule Take 1 each (1 capsule total) by mouth daily. 90 capsule 0  . FLUoxetine (PROZAC) 40 MG capsule TAKE TWO CAPSULES BY MOUTH EVERY DAY 180 capsule 1  . Glycopyrrolate-Formoterol (BEVESPI AEROSPHERE) 9-4.8 MCG/ACT AERO Inhale 2 puffs into the lungs 2 (two) times daily. 1 Inhaler 0  . pantoprazole (PROTONIX) 40 MG tablet TAKE 1 TABLET BY MOUTH TWICE DAILY 30 tablet 3  . polyethylene glycol-electrolytes (NULYTELY/GOLYTELY) 420 g solution Take 4,000 mLs by mouth as directed. 4000 mL 0   No facility-administered medications prior to visit.      Review of Systems  Constitutional:   No   weight loss, night sweats,  Fevers, chills, fatigue, or  lassitude.  HEENT:   No headaches,  Difficulty swallowing,  Tooth/dental problems, or  Sore throat,                No sneezing, itching, ear ache, +nasal congestion, post nasal drip,   CV:  No chest pain,  Orthopnea, PND, swelling in lower extremities, anasarca, dizziness, palpitations, syncope.   GI  No heartburn, indigestion, abdominal pain, nausea, vomiting,  diarrhea, change in bowel habits, loss of appetite, bloody stools.   Resp:    No chest wall deformity  Skin: no rash or lesions.  GU: no dysuria, change in color of urine, no urgency or frequency.  No flank pain, no hematuria   MS:  No joint pain or swelling.  No decreased range of motion.  No back pain.    Physical Exam  BP 126/84 (BP Location: Left Arm, Patient Position: Sitting, Cuff Size: Normal)   Pulse 80   Temp 97.6 F (36.4 C) (Oral)   Ht 5\' 2"  (1.575 m)   Wt 260 lb (117.9 kg)   SpO2 95%   BMI 47.55 kg/m   GEN: A/Ox3; pleasant , NAD, obese    HEENT:  [place]/AT,  EACs-clear, TMs-wnl, NOSE-clear, THROAT-clear, no lesions, no postnasal drip or exudate noted. Class 4 MP airway   NECK:  Supple w/ fair ROM; no JVD; normal carotid impulses w/o bruits; no thyromegaly or nodules palpated; no lymphadenopathy.    RESP  Clear  P & A; w/o, wheezes/ rales/ or rhonchi. no accessory muscle use, no dullness to percussion  CARD:  RRR, no m/r/g, no peripheral edema, pulses intact, no cyanosis or clubbing.  GI:   Soft & nt; nml bowel sounds; no organomegaly or masses detected.   Musco: Warm bil, no deformities or joint swelling noted.   Neuro: alert, no focal deficits noted.    Skin: Warm, no lesions or rashes    Lab Results:    BNP  Imaging: No results found.    PFT Results Latest Ref Rng & Units 05/26/2017  FVC-Pre L 2.23  FVC-Predicted Pre % 68  FVC-Post L 2.38  FVC-Predicted Post % 72  Pre FEV1/FVC % % 63  Post FEV1/FCV % % 67  FEV1-Pre L 1.40    FEV1-Predicted Pre % 55  DLCO UNC% % 55  DLCO COR %Predicted % 63  TLC L 5.32  TLC % Predicted % 105  RV % Predicted % 141    No results found for: NITRICOXIDE      Assessment & Plan:   COPD GOLD II still smoking  Moderate COPD with increased symptom burden  Smoking cessation discussed  Check HRCT chest to r/o ILD . (increased interstitial marking on chest xray and DLCO defect )  Heavy smoker  Add LAMA . Check alpha 1   Plan  Patient Instructions  Check Alpha 1 test today  Set up for HRCT chest .  Please get copy of sleep study results from [facility] .  Continue on Symbicort 2 puffs Twice daily  , rinse after use.  Begin INCRUSE 1 puff daily . Rinse after use .  Follow up in 4 weeks , bring all your respiratory meds to next visit.  Please contact office for sooner follow up if symptoms do not improve or worsen or seek emergency care        Comorbid sleep-related hypoventilation Obtain sleep study results  Cont on o2 At bedtime       Rexene Edison, NP 12/05/2017

## 2017-12-05 NOTE — Telephone Encounter (Signed)
Called and spoke with patient today regarding coverage of inhalers Advised pt of the prior message from [facility], she verbalized understanding. Pt is requesting to switch providers, requests to see another provider with our practice. Advised her that I will notify MW of this request, to see if he is okay with switching providers  MW please advise.

## 2017-12-05 NOTE — Telephone Encounter (Signed)
There is confusion here as she was supposed to be on bevespi 2bid and not on symbicort or spiriva so really can't help sort this out over the phone.  If on symbicort and spiriva ok to to give one sample of each then ov to regroup with formulary in hand

## 2017-12-05 NOTE — Patient Instructions (Addendum)
Check Alpha 1 test today  Set up for HRCT chest .  Please get copy of sleep study results from [facility] .  Continue on Symbicort 2 puffs Twice daily  , rinse after use.  Begin INCRUSE 1 puff daily . Rinse after use .  Follow up in 4 weeks , bring all your respiratory meds to next visit.  Please contact office for sooner follow up if symptoms do not improve or worsen or seek emergency care

## 2017-12-05 NOTE — Telephone Encounter (Addendum)
Called and spoke with patient today regarding below message from [facility]. Pt states that she could not tolerate Bevespi well and went back to spiriva.  Spiriva is not covered under her insurance at this time, but incruse or anoro is covered.  Pt reports increase of SOB, wheezing, chest tightness, and prod cough - yellow in last few weeks. When speaking with pt over the phone today, she was struggling to speak to be so short of breath. Advised pt to come in for ov to be seen by MW or NP Scheduled appt with TP today 2:30pm for acute ov Nothing further needed at this time.

## 2017-12-05 NOTE — Assessment & Plan Note (Signed)
Obtain sleep study results  Cont on o2 At bedtime

## 2017-12-05 NOTE — Telephone Encounter (Signed)
Fine with me  - should see np in meantime as per previous recs if not able to get acess to meds

## 2017-12-05 NOTE — Telephone Encounter (Signed)
That is fine 

## 2017-12-07 NOTE — Progress Notes (Signed)
Chart and office note reviewed in detail  > agree with a/p as outlined    

## 2017-12-08 ENCOUNTER — Other Ambulatory Visit: Payer: Self-pay | Admitting: Physician Assistant

## 2017-12-08 DIAGNOSIS — G894 Chronic pain syndrome: Secondary | ICD-10-CM

## 2017-12-08 NOTE — Telephone Encounter (Signed)
Requested medication (s) are due for refill today: Yes  Requested medication (s) are on the active medication list: Yes  Last refill:  12/29/13 by historical provider  Future visit scheduled: Yes  Notes to clinic:  Unable to refill due to historical provider, expired Rx     Requested Prescriptions  Pending Prescriptions Disp Refills   atorvastatin (LIPITOR) 20 MG tablet      Sig: Take 1 tablet (20 mg total) by mouth at bedtime.     Cardiovascular:  Antilipid - Statins Failed - 12/08/2017  3:47 PM      Failed - HDL in normal range and within 360 days    HDL  Date Value Ref Range Status  05/06/2017 28 (A) 35 - 70 Final         Failed - Triglycerides in normal range and within 360 days    Triglycerides  Date Value Ref Range Status  05/06/2017 192 (A) 40 - 160 Final         Passed - Total Cholesterol in normal range and within 360 days    Cholesterol  Date Value Ref Range Status  05/06/2017 130 0 - 200 Final         Passed - LDL in normal range and within 360 days    LDL Cholesterol  Date Value Ref Range Status  05/06/2017 64  Final         Passed - Patient is not pregnant      Passed - Valid encounter within last 12 months    Recent Outpatient Visits          2 weeks ago Hypothyroidism, unspecified type   [facility] [place], [place]   3 months ago Anxiety and depression   [facility] [place], [place], [place]   4 months ago Hyperlipidemia, unspecified hyperlipidemia type   [facility] [place], [place], [place]      Future Appointments            In 2 weeks Brunetta Jeans, PA-C [facility], [place]   In 3 weeks Parrett, Fonnie Mu, NP [facility]

## 2017-12-08 NOTE — Telephone Encounter (Signed)
Copied from [place] [phone]. Topic: Quick Communication - Rx Refill/Question >> Dec 08, 2017  9:28 AM Blase Mess A wrote: Medication: atorvastatin (LIPITOR) 20 MG tablet [[id number]]  Has the patient contacted their pharmacy? Yes  (Agent: If no, request that the patient contact the pharmacy for the refill.) (Agent: If yes, when and what did the pharmacy advise?)  Preferred Pharmacy (with phone number or street name): [facility], [place], [place] [address] [place] [address] Phone: [phone] Fax: [phone]    Agent: Please be advised that RX refills may take up to 3 business days. We ask that you follow-up with your pharmacy.

## 2017-12-08 NOTE — Telephone Encounter (Signed)
Advised patient she would need to come into the office to give a UDS and pick up rx for Hydrocodone. We need a UDS on file for compliance. She is agreeable. She will come in when we open to give a UDS.

## 2017-12-08 NOTE — Telephone Encounter (Signed)
Called pt and advised message from the provider. Pt understood and verbalized understanding. Nothing further is needed.    

## 2017-12-09 MED ORDER — ATORVASTATIN CALCIUM 20 MG PO TABS: 20.0000 mg | ORAL_TABLET | Freq: Every day | ORAL | 1 refills | Status: DC

## 2017-12-09 NOTE — Telephone Encounter (Signed)
Rx up front for pick up

## 2017-12-10 LAB — ALPHA-1 ANTITRYPSIN PHENOTYPE: A-1 Antitrypsin, Ser: 174 mg/dL (ref 83–199)

## 2017-12-11 ENCOUNTER — Other Ambulatory Visit (INDEPENDENT_AMBULATORY_CARE_PROVIDER_SITE_OTHER): Payer: Managed Care, Other (non HMO)

## 2017-12-11 ENCOUNTER — Other Ambulatory Visit: Payer: Self-pay | Admitting: *Deleted

## 2017-12-11 DIAGNOSIS — G894 Chronic pain syndrome: Secondary | ICD-10-CM

## 2017-12-14 LAB — PAIN MGMT, PROFILE 8 W/CONF, U
6 ACETYLMORPHINE: NEGATIVE ng/mL (ref <10)
ALPHAHYDROXYMIDAZOLAM: NEGATIVE ng/mL (ref <50)
ALPHAHYDROXYTRIAZOLAM: NEGATIVE ng/mL (ref <50)
AMINOCLONAZEPAM: NEGATIVE ng/mL (ref <25)
Alphahydroxyalprazolam: 560 ng/mL — ABNORMAL HIGH (ref <25)
Amphetamines: NEGATIVE ng/mL (ref <500)
BUPRENORPHINE, URINE: NEGATIVE ng/mL (ref <5)
Benzodiazepines: POSITIVE ng/mL — AB (ref <100)
Buprenorphine: NEGATIVE ng/mL (ref <2)
COCAINE METABOLITE: NEGATIVE ng/mL (ref <150)
Codeine: NEGATIVE ng/mL (ref <50)
Creatinine: 199.3 mg/dL
HYDROXYETHYLFLURAZEPAM: NEGATIVE ng/mL (ref <50)
Hydrocodone: 1445 ng/mL — ABNORMAL HIGH (ref <50)
Hydromorphone: NEGATIVE ng/mL (ref <50)
Lorazepam: NEGATIVE ng/mL (ref <50)
MDMA: NEGATIVE ng/mL (ref <500)
Morphine: NEGATIVE ng/mL (ref <50)
NORBUPRENORPHINE: NEGATIVE ng/mL (ref <2)
NORHYDROCODONE: 1558 ng/mL — AB (ref <50)
Nordiazepam: NEGATIVE ng/mL (ref <50)
OPIATES: POSITIVE ng/mL — AB (ref <100)
OXAZEPAM: NEGATIVE ng/mL (ref <50)
Oxidant: NEGATIVE ug/mL (ref <200)
TEMAZEPAM: NEGATIVE ng/mL (ref <50)
pH: 6.84 (ref 4.5–9.0)

## 2017-12-15 NOTE — Progress Notes (Signed)
Called spoke with patient, advised of lab results / recs as stated by TP.  Pt verbalized understanding and denied any questions. 

## 2017-12-16 ENCOUNTER — Other Ambulatory Visit: Payer: Self-pay | Admitting: Physician Assistant

## 2017-12-16 NOTE — Telephone Encounter (Signed)
Xanax rx last filled 10/15/17 #90 1 RF [facility]: 08/21/17 UDS: 12/11/17  Please advise

## 2017-12-18 DIAGNOSIS — J449 Chronic obstructive pulmonary disease, unspecified: Secondary | ICD-10-CM | POA: Diagnosis not present

## 2017-12-18 DIAGNOSIS — R1313 Dysphagia, pharyngeal phase: Secondary | ICD-10-CM | POA: Diagnosis not present

## 2017-12-18 DIAGNOSIS — R490 Dysphonia: Secondary | ICD-10-CM | POA: Diagnosis not present

## 2017-12-18 DIAGNOSIS — J343 Hypertrophy of nasal turbinates: Secondary | ICD-10-CM | POA: Diagnosis not present

## 2017-12-18 DIAGNOSIS — B3789 Other sites of candidiasis: Secondary | ICD-10-CM | POA: Diagnosis not present

## 2017-12-18 DIAGNOSIS — F17210 Nicotine dependence, cigarettes, uncomplicated: Secondary | ICD-10-CM | POA: Diagnosis not present

## 2017-12-22 ENCOUNTER — Ambulatory Visit (INDEPENDENT_AMBULATORY_CARE_PROVIDER_SITE_OTHER): Payer: Managed Care, Other (non HMO) | Admitting: Physician Assistant

## 2017-12-22 ENCOUNTER — Encounter: Payer: Self-pay | Admitting: Physician Assistant

## 2017-12-22 ENCOUNTER — Other Ambulatory Visit: Payer: Self-pay

## 2017-12-22 VITALS — BP 120/72 | HR 85 | Temp 98.5°F | Resp 16 | Ht 62.0 in | Wt 259.0 lb

## 2017-12-22 DIAGNOSIS — F419 Anxiety disorder, unspecified: Secondary | ICD-10-CM

## 2017-12-22 DIAGNOSIS — F329 Major depressive disorder, single episode, unspecified: Secondary | ICD-10-CM | POA: Diagnosis not present

## 2017-12-22 MED ORDER — BUPROPION HCL ER (XL) 150 MG PO TB24: 150.0000 mg | ORAL_TABLET | Freq: Every day | ORAL | 1 refills | Status: DC

## 2017-12-22 NOTE — Assessment & Plan Note (Signed)
Continue Sertraline at current dose. Will add on Wellbutrin 150 mg daily to current regimen. Close follow-up scheduled.

## 2017-12-22 NOTE — Patient Instructions (Addendum)
Please keep well-hydrated and get plenty of rest.  Try to work on deep breathing -- breathe in over a 5 count, hold for 5 and exhale slowly for 5 count.   Please continue the Sertraline daily.  Start the Wellbutrin daily as directed. Follow-up with me in 3-4 weeks for reassessment.   Call Dr. Charlestine Night office to schedule a follow-up appointment for ongoing back issues.

## 2017-12-22 NOTE — Progress Notes (Signed)
Patient presents to clinic today for follow-up of anxiety/depression. At last visit we switched her Sertraline to Fluoxetine as she felt the Sertraline was subtherapeutic. After changing, patient noted worsening of mood and headache. Took medication for 10 days before switching back to her Sertraline. Notes resolution of headache and improvement in mood with this. Still noting depressed mood however. Denies SI/HI. Notes significant stressors at home, mainly her son who has lived with them for several years. Does not work and they pay for most things. Has a criminal record which makes it harder for him to get a job.   Past Medical History:  Diagnosis Date  . Allergic rhinitis   . Anemia   . Anxiety   . Chicken pox   . Chronic back pain   . COPD (chronic obstructive pulmonary disease) ([place])   . Depression   . Essential hypertension   . GERD (gastroesophageal reflux disease)   . Headache    migraines  . History of gastritis    EGD 2015  . History of home oxygen therapy    2 liters at hs last 6 months  . Hyperlipidemia   . Hypothyroidism   . Migraines   . Osteoarthritis    oa  . Scoliosis     Current Outpatient Medications on File Prior to Visit  Medication Sig Dispense Refill  . albuterol (PROAIR HFA) 108 (90 Base) MCG/ACT inhaler 2 puffs every 4 hours as needed only  if your can't catch your breath (Patient taking differently: Inhale 2 puffs into the lungs every 4 (four) hours as needed for wheezing or shortness of breath. ) 1 Inhaler 1  . ALPRAZolam (XANAX) 1 MG tablet TAKE 1 TABLET BY MOUTH THREE TIMES DAILY AS NEEDED FOR ANXIETY 90 tablet 0  . atorvastatin (LIPITOR) 20 MG tablet Take 1 tablet (20 mg total) by mouth at bedtime. 90 tablet 1  . ferrous sulfate 325 (65 FE) MG EC tablet Take 325 mg by mouth 2 (two) times daily.    . fluticasone (FLONASE) 50 MCG/ACT nasal spray Place 2 sprays into both nostrils daily.    Marland Kitchen gabapentin (NEURONTIN) 300 MG capsule Take 600 mg by mouth 3  times daily    . HYDROcodone-acetaminophen (NORCO) 10-325 MG tablet TAKE 1 TABLET BY MOUTH THREE TIMES DAILY 90 tablet 0  . levothyroxine (SYNTHROID, LEVOTHROID) 75 MCG tablet TAKE 1 TABLET BY MOUTH DAILY BEFORE BREAKFAST 90 tablet 1  . loratadine (CLARITIN) 10 MG tablet Take 10 mg by mouth daily.    Marland Kitchen omeprazole (PRILOSEC) 40 MG capsule TAKE 1 CAPSULE BY MOUTH BEFORE BREAKFAST AND TAKE 1 CAPSULE BY MOUTH BEFORE DINNER 60 capsule 3  . OXYGEN Inhale 2 L into the lungs at bedtime.     . potassium chloride (K-DUR,KLOR-CON) 10 MEQ tablet TAKE 1 TABLET BY MOUTH AT BEDTIME 90 tablet 1  . ranitidine (ZANTAC) 150 MG tablet Take 1 tablet (150 mg total) by mouth 2 (two) times daily. 180 tablet 2  . sertraline (ZOLOFT) 100 MG tablet TAKE TWO TABLETS BY MOUTH AT BEDTIME 180 tablet 1  . SYMBICORT 160-4.5 MCG/ACT inhaler INHALE TWO PUFFS BY MOUTH TWICE DAILY 10.2 g 0  . triamterene-hydrochlorothiazide (DYAZIDE) 37.5-25 MG capsule Take 1 each (1 capsule total) by mouth daily. 90 capsule 0  . umeclidinium bromide (INCRUSE ELLIPTA) 62.5 MCG/INH AEPB Inhale 1 puff into the lungs daily. 1 each 5   No current facility-administered medications on file prior to visit.     Allergies  Allergen  Reactions  . Nsaids Diarrhea  . Aleve [Naproxen Sodium] Other (See Comments)    Headache   . Codeine Nausea Only and Other (See Comments)    GI upset  . Penicillins Nausea Only and Other (See Comments)    GI upset Has patient had a PCN reaction causing immediate rash, facial/tongue/throat swelling, SOB or lightheadedness with hypotension: No Has patient had a PCN reaction causing severe rash involving mucus membranes or skin necrosis: No Has patient had a PCN reaction that required hospitalization No Has patient had a PCN reaction occurring within the last 10 years: No If all of the above answers are "NO", then may proceed with Cephalosporin use.   . Sulfonamide Derivatives Hives    Family History  Problem Relation  Age of Onset  . COPD Mother   . Heart disease Mother   . Lung disease Father        Asbestosis  . Heart attack Father   . Heart disease Father   . Cerebral aneurysm Brother   . Aneurysm Brother        Brain  . Epilepsy Son   . Arthritis Maternal Grandmother   . Heart disease Maternal Grandmother   . Asthma Maternal Grandfather   . Cancer Maternal Grandfather   . Arthritis Paternal Grandmother   . Heart disease Paternal Grandmother   . Stroke Paternal Grandmother   . Early death Paternal Grandfather   . Heart disease Paternal Grandfather     Social History   Socioeconomic History  . Marital status: Married    Spouse name: Not on file  . Number of children: Not on file  . Years of education: Not on file  . Highest education level: Not on file  Occupational History  . Not on file  Social Needs  . Financial resource strain: Not on file  . Food insecurity:    Worry: Not on file    Inability: Not on file  . Transportation needs:    Medical: Not on file    Non-medical: Not on file  Tobacco Use  . Smoking status: Current Every Day Smoker    Packs/day: 1.50    Years: 46.00    Pack years: 69.00    Types: Cigarettes  . Smokeless tobacco: Never Used  Substance and Sexual Activity  . Alcohol use: No  . Drug use: No  . Sexual activity: Yes  Lifestyle  . Physical activity:    Days per week: Not on file    Minutes per session: Not on file  . Stress: Not on file  Relationships  . Social connections:    Talks on phone: Not on file    Gets together: Not on file    Attends religious service: Not on file    Active member of club or organization: Not on file    Attends meetings of clubs or organizations: Not on file    Relationship status: Not on file  Other Topics Concern  . Not on file  Social History Narrative  . Not on file   Review of Systems - See HPI.  All other ROS are negative.  BP 120/72   Pulse 85   Temp 98.5 F (36.9 C) (Oral)   Resp 16   Ht 5' 2"   (1.575 m)   Wt 259 lb (117.5 kg)   SpO2 98%   BMI 47.37 kg/m   Physical Exam  Constitutional: She is oriented to person, place, and time. She appears well-developed and well-nourished.  Cardiovascular:  Normal rate, regular rhythm, normal heart sounds and intact distal pulses.  Pulmonary/Chest: Effort normal and breath sounds normal. No stridor. No respiratory distress. She has no wheezes. She has no rales. She exhibits no tenderness.  Neurological: She is alert and oriented to person, place, and time.  Vitals reviewed.  Recent Results (from the past 2160 hour(s))  Comp Met (CMET)     Status: Abnormal   Collection Time: 11/24/17  4:01 PM  Result Value Ref Range   Sodium 138 135 - 145 mEq/L   Potassium 3.7 3.5 - 5.1 mEq/L   Chloride 102 96 - 112 mEq/L   CO2 30 19 - 32 mEq/L   Glucose, Bld 88 70 - 99 mg/dL   BUN 16 6 - 23 mg/dL   Creatinine, Ser 1.19 0.40 - 1.20 mg/dL   Total Bilirubin 0.3 0.2 - 1.2 mg/dL   Alkaline Phosphatase 60 39 - 117 U/L   AST 31 0 - 37 U/L   ALT 24 0 - 35 U/L   Total Protein 7.0 6.0 - 8.3 g/dL   Albumin 3.7 3.5 - 5.2 g/dL   Calcium 8.7 8.4 - 10.5 mg/dL   GFR 48.88 (L) >60.00 mL/min  TSH     Status: None   Collection Time: 11/24/17  4:01 PM  Result Value Ref Range   TSH 4.11 0.35 - 4.50 uIU/mL  Alpha-1 antitrypsin phenotype     Status: None   Collection Time: 12/05/17  3:13 PM  Result Value Ref Range   A-1 Antitrypsin, Ser 174 83 - 199 mg/dL   ALPHA-1-ANTITRYPSIN (AAT) PHENOTYPE SEE NOTE     Comment: THIS PATIENT'S ALPHA-1-ANTITRYPSIN PHENOTYPE IS PI*MM. Marland Kitchen 90% of normal individuals have the MM phenotype, with normal quantitative AAT levels. Many phenotypic patterns have been described, including deficiency states with F, S, Z, or other alleles. As a general estimation, compared to M allele of 100% of normal A-1-Antitrypsin protein, the S allele produces approximately 60% and the Z allele 20%. For example, an MS phenotype would have about 80% of  normal A-1-Antitrypsin protein level, a 50% contribution from the M allele and 30% from the S allele. A ZZ phenotype would have about 20% of normal levels, a 10% contribution from each Z gene. The F allele has normal A-1-Antitrypsin levels, but the kinetics of elastase inhibition is not as efficient as an M allele product; F alleles should be considered functionally mildly deficient. Other variants are identifiable by phenotypic analysis. These include CM, DP, EM, GM, IS, LM, M1M2, M3M3, MP, MT, XX, MY, and M1N. I, P, T and  null alleles are considered deleterious. C, D, E, G, L, M1, M2, M3, X and Y alleles are generally considered normal variants. The MZ-Pratt phenotype is a normal variant; care should be taken to avoid confusion with the deficient MZ phenotype.   Pain Mgmt, Profile 8 w/Conf, U     Status: Abnormal   Collection Time: 12/11/17  1:27 PM  Result Value Ref Range   Creatinine 199.3 > or = 20. mg/dL   pH 6.84 4.5 - 9.0   Oxidant NEGATIVE <200 mcg/mL   Amphetamines NEGATIVE <500 ng/mL   medMATCH Amphetamines CONSISTENT    Benzodiazepines POSITIVE (A) <100 ng/mL   Alphahydroxyalprazolam 560 (H) <25 ng/mL    Comment: See Note 1   medMATCH aOH alprazolam INCONSISTENT    Alphahydroxymidazolam NEGATIVE <50 ng/mL    Comment: See Note 1   medMATCH aOH midazolam CONSISTENT    Alphahydroxytriazolam NEGATIVE <50 ng/mL  Comment: See Note 1   medMATCH aOH triazolam CONSISTENT    Aminoclonazepam NEGATIVE <25 ng/mL    Comment: See Note 1   medMATCH Aminoclonazepam CONSISTENT    Hydroxyethylflurazepam NEGATIVE <50 ng/mL    Comment: See Note 1   medMATCH OH,Et flurazepam CONSISTENT    Lorazepam NEGATIVE <50 ng/mL    Comment: See Note 1   medMATCH Lorazepam CONSISTENT    Nordiazepam NEGATIVE <50 ng/mL    Comment: See Note 1   medMATCH Nordiazepam CONSISTENT    Oxazepam NEGATIVE <50 ng/mL    Comment: See Note 1   medMATCH Oxazepam CONSISTENT    Temazepam NEGATIVE <50  ng/mL    Comment: See Note 1   medMATCH Temazepam CONSISTENT    Marijuana Metabolite INTERFERENCE <20 ng/mL    Comment: . The above test was performed, but the test results could not be interpreted due to non-specific interference. .    Cocaine Metabolite NEGATIVE <150 ng/mL   [facility] Cocaine Metab CONSISTENT    Opiates POSITIVE (A) <100 ng/mL   Codeine NEGATIVE <50 ng/mL    Comment: See Note 1   medMATCH Codeine CONSISTENT    Hydrocodone 1,445 (H) <50 ng/mL    Comment: See Note 1   medMATCH Hydrocodone INCONSISTENT    Hydromorphone NEGATIVE <50 ng/mL    Comment: See Note 1   medMATCH Hydromorphone CONSISTENT    Morphine NEGATIVE <50 ng/mL    Comment: See Note 1   medMATCH Morphine CONSISTENT    Norhydrocodone 1,558 (H) <50 ng/mL    Comment: See Note 1   medMATCH Norhydrocodone INCONSISTENT     Comment: See Note 2   Oxycodone INTERFERENCE <100 ng/mL    Comment: . The above test was performed, but the test results could not be interpreted due to non-specific interference. .    Buprenorphine, Urine NEGATIVE CONFIRMED <5 ng/mL   Buprenorphine NEGATIVE <2 ng/mL    Comment: See Note 1   medMATCH Buprenorphine CONSISTENT    Norbuprenorphine NEGATIVE <2 ng/mL    Comment: See Note 1   medMATCH Norbuprenorphine CONSISTENT     Comment: Note 1 . This test was developed and its analytical performance  characteristics have been determined by [employer]. It has not been cleared or approved by the FDA. This assay has been validated pursuant to the CLIA  regulations and is used for clinical purposes. . Note 2 Norhydrocodone is a metabolite of Hydrocodone.    MDMA NEGATIVE <500 ng/mL   [facility] MDMA CONSISTENT    Alcohol Metabolites INTERFERENCE <500 ng/mL    Comment: . The above test was performed, but the test results could not be interpreted due to non-specific interference. .    6 Acetylmorphine NEGATIVE <10 ng/mL   medMATCH 6 Acetylmorphine CONSISTENT      Comment: This drug testing is for medical treatment only.   Analysis was performed as non-forensic testing and  these results should be used only by healthcare  providers to render diagnosis or treatment, or to  monitor progress of medical conditions. Hazel Sams comments are:  - present when drug test results may be the result of     metabolism of one or more drugs or when results are     inconsistent with prescribed medication(s) listed.  - may be blank when drug results are consistent with     prescribed medication(s) listed. . For assistance with interpreting these drug results,  please contact a [employer] Toxicology  Specialist: [phone] [place] [phone]),  M-F,  8am-6pm EST. This drug testing is for medical treatment only.   Analysis was performed as non-forensic testing and  these results should be used only by healthcare  providers to render diagnosis or treatment, or to  monitor progress of medical conditions. Hazel Sams comments are:  - present when  drug test results may be the result of     metabolism of one or more drugs or when results are     inconsistent with prescribed medication(s) listed.  - may be blank when drug results are consistent with     prescribed medication(s) listed. . For assistance with interpreting these drug results,  please contact a [employer] Toxicology  Specialist: [phone] [place] [phone]), M-F,  8am-6pm EST. This drug testing is for medical treatment only.   Analysis was performed as non-forensic testing and  these results should be used only by healthcare  providers to render diagnosis or treatment, or to  monitor progress of medical conditions. Hazel Sams comments are:  - present when drug test results may be the result of     metabolism of one or more drugs or when results are     inconsistent with prescribed medication(s) listed.  - may be blank when drug results are consistent with     prescribed  medication(s) listed. . For assistance with interpreting these d rug results,  please contact a Chartered certified accountant Toxicology  Specialist: [phone] [place] [phone]), M-F,  8am-6pm EST. This drug testing is for medical treatment only.   Analysis was performed as non-forensic testing and  these results should be used only by healthcare  providers to render diagnosis or treatment, or to  monitor progress of medical conditions. Hazel Sams comments are:  - present when drug test results may be the result of     metabolism of one or more drugs or when results are     inconsistent with prescribed medication(s) listed.  - may be blank when drug results are consistent with     prescribed medication(s) listed. . For assistance with interpreting these drug results,  please contact a [employer] Toxicology  Specialist: [phone] [place] [phone]), M-F,  8am-6pm EST. This drug testing is for medical treatment only.   Analysis was performed as non-forensic testing and  these results should be used only by healthcare  provi ders to render diagnosis or treatment, or to  monitor progress of medical conditions. Hazel Sams comments are:  - present when drug test results may be the result of     metabolism of one or more drugs or when results are     inconsistent with prescribed medication(s) listed.  - may be blank when drug results are consistent with     prescribed medication(s) listed. . For assistance with interpreting these drug results,  please contact a [employer] Toxicology  Specialist: [phone] [place] [phone]), M-F,  8am-6pm EST.     Assessment/Plan: Anxiety and depression Continue Sertraline at current dose. Will add on Wellbutrin 150 mg daily to current regimen. Close follow-up scheduled.     [name], PA-C

## 2017-12-24 ENCOUNTER — Ambulatory Visit (HOSPITAL_COMMUNITY)
Admission: RE | Admit: 2017-12-24 | Discharge: 2017-12-24 | Disposition: A | Discharge status: Home / Self Care | Payer: Managed Care, Other (non HMO) | Source: Ambulatory Visit | Attending: Adult Health | Admitting: Adult Health

## 2017-12-24 DIAGNOSIS — J441 Chronic obstructive pulmonary disease with (acute) exacerbation: Secondary | ICD-10-CM | POA: Insufficient documentation

## 2018-01-02 ENCOUNTER — Ambulatory Visit: Payer: Managed Care, Other (non HMO) | Admitting: Adult Health

## 2018-01-05 ENCOUNTER — Other Ambulatory Visit: Payer: Self-pay | Admitting: Physician Assistant

## 2018-01-05 ENCOUNTER — Ambulatory Visit: Payer: Managed Care, Other (non HMO) | Admitting: Adult Health

## 2018-01-07 ENCOUNTER — Telehealth: Payer: Self-pay | Admitting: Internal Medicine

## 2018-01-07 DIAGNOSIS — G4733 Obstructive sleep apnea (adult) (pediatric): Secondary | ICD-10-CM

## 2018-01-07 NOTE — Telephone Encounter (Signed)
Per TP: yes, she will need a CPAP titration study.  Thank you.

## 2018-01-07 NOTE — Telephone Encounter (Signed)
Called and spoke with [name], she stated that she has an appointment coming up and they are trying to get her account together for insurance purposes. Patient told [employer] that she is only using the oxygen and not the CPAP however her insurance stated that she is to be using both. She is needing a new study titration done. She will need to qualify during the study to show that she needs to CPAP. Or she needs to qualify for 24 hour oxygen.  TP please advise, thank you.

## 2018-01-07 NOTE — Telephone Encounter (Signed)
Called patient unable to reach left message to give [place] a call back. Titration has been ordered.

## 2018-01-09 ENCOUNTER — Ambulatory Visit: Payer: Managed Care, Other (non HMO) | Admitting: Adult Health

## 2018-01-12 ENCOUNTER — Encounter: Payer: Self-pay | Admitting: Adult Health

## 2018-01-12 ENCOUNTER — Telehealth: Payer: Self-pay | Admitting: Adult Health

## 2018-01-12 ENCOUNTER — Ambulatory Visit (INDEPENDENT_AMBULATORY_CARE_PROVIDER_SITE_OTHER): Payer: Managed Care, Other (non HMO) | Admitting: Adult Health

## 2018-01-12 DIAGNOSIS — J449 Chronic obstructive pulmonary disease, unspecified: Secondary | ICD-10-CM

## 2018-01-12 DIAGNOSIS — J9611 Chronic respiratory failure with hypoxia: Secondary | ICD-10-CM | POA: Diagnosis not present

## 2018-01-12 NOTE — Patient Instructions (Addendum)
Continue on Symbicort 2 puffs Twice daily  , rinse after use.  Continue INCRUSE 1 puff daily . Rinse after use .  Continue on Oxygen 2l/m At bedtime   Follow up in 3 months with Dr. Melvyn Novas  Or Shea Kapur NP  Please contact office for sooner follow up if symptoms do not improve or worsen or seek emergency care

## 2018-01-12 NOTE — Assessment & Plan Note (Addendum)
Patient is to continue on nocturnal oxygen at 2 L.  Previous sleep study in 2016 showed no evidence of sleep apnea.  Did have some nocturnal hypoxemia.  Plan  Continue on Oxygen 2l/m At bedtime

## 2018-01-12 NOTE — Telephone Encounter (Signed)
Will route this to TP as an FYI and will await fax results.

## 2018-01-12 NOTE — Progress Notes (Signed)
@Patient  ID: [name], female    DOB: 03-Nov-1955, 62 y.o.   MRN: [id number]  Chief Complaint  Patient presents with  . Follow-up    COPD     Referring provider: Delorse Limber  HPI: 62 year old female smoker seen for initial pulmonary consult February 2019 found to have gold 2 COPD with reversibility and oxygen dependent respiratory failure on nocturnal oxygen at 2 L Is disabled from orthopedic issues.-severe hip issues  Medical hx of Dyslipidemia , HTN , anemia   TEST/EVENTS :  Spirometry 2/19/2019FEV1 1.54 (66%) Ratio 75 with min curvature p am symb - Allergy profile 03/18/17 >Eos 0.3 / IgE 4 neg RAST  - PFT's 4/29/2019FEV1 1.60 (63 % ) ratio 67 p 14 % improvement from saba p sym/ spriva prior to study with DLCO 55 % corrects to 63 % for alv volume with EOS up to 0.8 HRCT chest 12/24/2017-no ILD, mild to moderate emphysema Alpha-1 December 05, 2017 normal, MM, 174  01/12/2018 Follow up : COPD , O2 RF  Patient returns for a one-month follow-up.  Patient was seen last visit for increased symptom burden from COPD.  She was continued on Symbicort twice daily.  And Incruse was added to her regimen.  High-resolution CT chest was done that showed emphysema.  No ILD.  Since last visit patient says she is starting to feel better.  She has cut back on her smoking. Remains on Symbicort and Incruse.  He denies any flare of cough or wheezing.  Declines flu shot .   Patient had a sleep study done at [facility] February 2016 that showed no sleep apnea with a AHI of 0.9.  Nocturnal hypoxemia was recommended to use oxygen at bedtime.  She remains on 2 L of oxygen.  [facility] were reviewed.     Allergies  Allergen Reactions  . Nsaids Diarrhea  . Aleve [Naproxen Sodium] Other (See Comments)    Headache   . Codeine Nausea Only and Other (See Comments)    GI upset  . Penicillins Nausea Only and Other (See Comments)    GI upset Has  patient had a PCN reaction causing immediate rash, facial/tongue/throat swelling, SOB or lightheadedness with hypotension: No Has patient had a PCN reaction causing severe rash involving mucus membranes or skin necrosis: No Has patient had a PCN reaction that required hospitalization No Has patient had a PCN reaction occurring within the last 10 years: No If all of the above answers are "NO", then may proceed with Cephalosporin use.   . Sulfonamide Derivatives Hives    Immunization History  Administered Date(s) Administered  . Influenza-Unspecified 01/16/2011, 11/26/2011, 12/22/2012, 11/17/2013    Past Medical History:  Diagnosis Date  . Allergic rhinitis   . Anemia   . Anxiety   . Chicken pox   . Chronic back pain   . COPD (chronic obstructive pulmonary disease) ([place])   . Depression   . Essential hypertension   . GERD (gastroesophageal reflux disease)   . Headache    migraines  . History of gastritis    EGD 2015  . History of home oxygen therapy    2 liters at hs last 6 months  . Hyperlipidemia   . Hypothyroidism   . Migraines   . Osteoarthritis    oa  . Scoliosis     Tobacco History: Social History   Tobacco Use  Smoking Status Current Every Day Smoker  . Packs/day: 1.50  . Years: 46.00  .  Pack years: 69.00  . Types: Cigarettes  Smokeless Tobacco Never Used   Ready to quit: No Counseling given: Yes   Outpatient Medications Prior to Visit  Medication Sig Dispense Refill  . albuterol (PROAIR HFA) 108 (90 Base) MCG/ACT inhaler 2 puffs every 4 hours as needed only  if your can't catch your breath (Patient taking differently: Inhale 2 puffs into the lungs every 4 (four) hours as needed for wheezing or shortness of breath. ) 1 Inhaler 1  . ALPRAZolam (XANAX) 1 MG tablet TAKE 1 TABLET BY MOUTH THREE TIMES DAILY AS NEEDED FOR ANXIETY 90 tablet 0  . atorvastatin (LIPITOR) 20 MG tablet Take 1 tablet (20 mg total) by mouth at bedtime. 90 tablet 1  . buPROPion  (WELLBUTRIN XL) 150 MG 24 hr tablet Take 1 tablet (150 mg total) by mouth daily. 30 tablet 1  . ferrous sulfate 325 (65 FE) MG EC tablet Take 325 mg by mouth daily with breakfast.     . fluticasone (FLONASE) 50 MCG/ACT nasal spray Place 2 sprays into both nostrils daily.    Marland Kitchen gabapentin (NEURONTIN) 300 MG capsule Take 600 mg by mouth 3 times daily    . HYDROcodone-acetaminophen (NORCO) 10-325 MG tablet TAKE 1 TABLET BY MOUTH THREE TIMES DAILY 90 tablet 0  . levothyroxine (SYNTHROID, LEVOTHROID) 75 MCG tablet TAKE 1 TABLET BY MOUTH DAILY BEFORE BREAKFAST 90 tablet 1  . loratadine (CLARITIN) 10 MG tablet Take 10 mg by mouth daily.    Marland Kitchen omeprazole (PRILOSEC) 40 MG capsule TAKE 1 CAPSULE BY MOUTH BEFORE BREAKFAST AND TAKE 1 CAPSULE BY MOUTH BEFORE DINNER 60 capsule 3  . OXYGEN Inhale 2 L into the lungs at bedtime.     . potassium chloride (K-DUR,KLOR-CON) 10 MEQ tablet TAKE 1 TABLET BY MOUTH AT BEDTIME 90 tablet 1  . ranitidine (ZANTAC) 150 MG tablet Take 1 tablet (150 mg total) by mouth 2 (two) times daily. 180 tablet 2  . sertraline (ZOLOFT) 100 MG tablet TAKE TWO TABLETS BY MOUTH AT BEDTIME 180 tablet 1  . SYMBICORT 160-4.5 MCG/ACT inhaler INHALE TWO PUFFS BY MOUTH TWICE DAILY 10.2 g 0  . umeclidinium bromide (INCRUSE ELLIPTA) 62.5 MCG/INH AEPB Inhale 1 puff into the lungs daily. 1 each 5  . triamterene-hydrochlorothiazide (DYAZIDE) 37.5-25 MG capsule Take 1 each (1 capsule total) by mouth daily. 90 capsule 0  . triamterene-hydrochlorothiazide (MAXZIDE-25) 37.5-25 MG tablet TAKE 1 TABLET BY MOUTH EVERY DAY 90 tablet 1   No facility-administered medications prior to visit.      Review of Systems  Constitutional:   No  weight loss, night sweats,  Fevers, chills, + fatigue, or  lassitude.  HEENT:   No headaches,  Difficulty swallowing,  Tooth/dental problems, or  Sore throat,                No sneezing, itching, ear ache, nasal congestion, post nasal drip,   CV:  No chest pain,  Orthopnea,  PND, swelling in lower extremities, anasarca, dizziness, palpitations, syncope.   GI  No heartburn, indigestion, abdominal pain, nausea, vomiting, diarrhea, change in bowel habits, loss of appetite, bloody stools.   Resp:  No chest wall deformity  Skin: no rash or lesions.  GU: no dysuria, change in color of urine, no urgency or frequency.  No flank pain, no hematuria   MS:  No joint pain or swelling.  No decreased range of motion.  No back pain.    Physical Exam  BP 116/64 (BP Location: Left  Arm, Cuff Size: Large)   Pulse 98   Ht 5\' 2"  (1.575 m)   Wt 266 lb (120.7 kg)   SpO2 93%   BMI 48.65 kg/m   GEN: A/Ox3; pleasant , NAD, obese   HEENT:  [place]/AT,  EACs-clear, TMs-wnl, NOSE-clear, THROAT-clear, no lesions, no postnasal drip or exudate noted.   NECK:  Supple w/ fair ROM; no JVD; normal carotid impulses w/o bruits; no thyromegaly or nodules palpated; no lymphadenopathy.    RESP decreased breath sounds in the bases no accessory muscle use, no dullness to percussion  CARD:  RRR, no m/r/g, tr  peripheral edema, pulses intact, no cyanosis or clubbing.  GI:   Soft & nt; nml bowel sounds; no organomegaly or masses detected.   Musco: Warm bil, no deformities or joint swelling noted.   Neuro: alert, no focal deficits noted.    Skin: Warm, no lesions or rashes    Lab Results:  CBC  BNP  Imaging: Ct Chest High Resolution  Result Date: 12/24/2017 CLINICAL DATA:  COPD exacerbation.  Worsening dyspnea. EXAM: CT CHEST WITHOUT CONTRAST TECHNIQUE: Multidetector CT imaging of the chest was performed following the standard protocol without intravenous contrast. High resolution imaging of the lungs, as well as inspiratory and expiratory imaging, was performed. COMPARISON:  03/18/2017 chest radiograph. FINDINGS: Cardiovascular: Normal heart size. No significant pericardial effusion/thickening. Left anterior descending and left circumflex coronary atherosclerosis. Atherosclerotic  nonaneurysmal thoracic aorta. Normal caliber pulmonary arteries. Mediastinum/Nodes: No discrete thyroid nodules. Unremarkable esophagus. No pathologically enlarged axillary, mediastinal or hilar lymph nodes, noting limited sensitivity for the detection of hilar adenopathy on this noncontrast study. Lungs/Pleura: No pneumothorax. No pleural effusion. Mild centrilobular and mild-to-moderate paraseptal emphysema with mild diffuse bronchial wall thickening. No acute consolidative airspace disease, lung masses or significant pulmonary nodules. No significant lobular air trapping on the expiration sequence. No significant regions of subpleural reticulation, ground-glass attenuation, traction bronchiectasis, parenchymal banding, architectural distortion or frank honeycombing. Upper abdomen: Diffuse hepatic steatosis. Cholecystectomy. Left adrenal 1.5 cm nodule with density 7 HU, compatible with an adenoma. Musculoskeletal: No aggressive appearing focal osseous lesions. Marked thoracic spondylosis. IMPRESSION: 1. No evidence of interstitial lung disease. 2. Mild centrilobular and mild-to-moderate paraseptal emphysema with mild diffuse bronchial wall thickening, compatible with the provided history of COPD. 3. Two-vessel coronary atherosclerosis. 4. Diffuse hepatic steatosis. 5. Left adrenal adenoma. Aortic Atherosclerosis (ICD10-I70.0) and Emphysema (ICD10-J43.9). Electronically Signed   By: Ilona Sorrel M.D.   On: 12/24/2017 14:03      PFT Results Latest Ref Rng & Units 05/26/2017  FVC-Pre L 2.23  FVC-Predicted Pre % 68  FVC-Post L 2.38  FVC-Predicted Post % 72  Pre FEV1/FVC % % 63  Post FEV1/FCV % % 67  FEV1-Pre L 1.40  FEV1-Predicted Pre % 55  FEV1-Post L 1.60  DLCO UNC% % 55  DLCO COR %Predicted % 63  TLC L 5.32  TLC % Predicted % 105  RV % Predicted % 141    No results found for: NITRICOXIDE      Assessment & Plan:   COPD GOLD II still smoking  Improved on current regimen with decreased  symptom burden.  Patient is encouraged on smoking cessation. declines pulmonary rehab referral.  Declines flu shot. Plan  Patient Instructions  Continue on Symbicort 2 puffs Twice daily  , rinse after use.  Continue INCRUSE 1 puff daily . Rinse after use .  Continue on Oxygen 2l/m At bedtime   Follow up in 3 months with Dr. Melvyn Novas  Or Parrett NP  Please contact office for sooner follow up if symptoms do not improve or worsen or seek emergency care         Chronic respiratory failure with hypoxia ([place]) Patient is to continue on nocturnal oxygen at 2 L.  Previous sleep study in 2016 showed no evidence of sleep apnea.  Did have some nocturnal hypoxemia.  Plan  Continue on Oxygen 2l/m At bedtime       Rexene Edison, NP 01/12/2018

## 2018-01-12 NOTE — Assessment & Plan Note (Signed)
Improved on current regimen with decreased symptom burden.  Patient is encouraged on smoking cessation. declines pulmonary rehab referral.  Declines flu shot. Plan  Patient Instructions  Continue on Symbicort 2 puffs Twice daily  , rinse after use.  Continue INCRUSE 1 puff daily . Rinse after use .  Continue on Oxygen 2l/m At bedtime   Follow up in 3 months with Dr. Melvyn Novas  Or Olsen Mccutchan NP  Please contact office for sooner follow up if symptoms do not improve or worsen or seek emergency care

## 2018-01-12 NOTE — Progress Notes (Signed)
Chart and office note reviewed in detail  > agree with a/p as outlined    

## 2018-01-12 NOTE — Telephone Encounter (Signed)
Results received and reviewed by TP Sent for scan Patient's daughter is aware results were received  Nothing further needed; will sign off.

## 2018-01-13 ENCOUNTER — Other Ambulatory Visit: Payer: Self-pay | Admitting: Physician Assistant

## 2018-01-13 NOTE — Telephone Encounter (Signed)
Last OV 12/22/17 Hydrocodone last filled 12/08/17 #90 with 0 Alprazolam last filled 12/16/17

## 2018-01-16 ENCOUNTER — Ambulatory Visit: Payer: Managed Care, Other (non HMO) | Admitting: Physician Assistant

## 2018-01-19 ENCOUNTER — Ambulatory Visit (INDEPENDENT_AMBULATORY_CARE_PROVIDER_SITE_OTHER): Payer: Managed Care, Other (non HMO) | Admitting: Physician Assistant

## 2018-01-19 ENCOUNTER — Encounter: Payer: Self-pay | Admitting: Physician Assistant

## 2018-01-19 VITALS — BP 122/80 | HR 91 | Temp 98.0°F | Resp 16 | Ht 62.0 in | Wt 261.8 lb

## 2018-01-19 DIAGNOSIS — F419 Anxiety disorder, unspecified: Secondary | ICD-10-CM | POA: Diagnosis not present

## 2018-01-19 DIAGNOSIS — R062 Wheezing: Secondary | ICD-10-CM

## 2018-01-19 DIAGNOSIS — F329 Major depressive disorder, single episode, unspecified: Secondary | ICD-10-CM | POA: Diagnosis not present

## 2018-01-19 DIAGNOSIS — F32A Depression, unspecified: Secondary | ICD-10-CM

## 2018-01-19 MED ORDER — ALBUTEROL SULFATE (2.5 MG/3ML) 0.083% IN NEBU: 2.5000 mg | INHALATION_SOLUTION | Freq: Once | RESPIRATORY_TRACT | Status: DC

## 2018-01-19 MED ORDER — PREDNISONE 20 MG PO TABS: 40.0000 mg | ORAL_TABLET | Freq: Every day | ORAL | 0 refills | Status: DC

## 2018-01-19 MED ORDER — BUPROPION HCL ER (XL) 300 MG PO TB24: 300.0000 mg | ORAL_TABLET | Freq: Every day | ORAL | 1 refills | Status: DC

## 2018-01-19 NOTE — Progress Notes (Signed)
Patient presents to clinic today for follow-up of depression. At last visit patient was started on Wellbutrin 150 mg daily in addition to her chronic sertraline dose. Endorses taking medication as directed and tolerating well. Notes only small improvement in mood so far with medication. Denies SI/HI. Denies any new or worsening mood symptoms.  Patient also with a history of COPD noting increased wheeze and work of breathing over the past few days. Denies chest congestion, fever, chills. Denies chest pain. Is using her medications as directed.  Past Medical History:  Diagnosis Date  . Allergic rhinitis   . Anemia   . Anxiety   . Chicken pox   . Chronic back pain   . COPD (chronic obstructive pulmonary disease) ([place])   . Depression   . Essential hypertension   . GERD (gastroesophageal reflux disease)   . Headache    migraines  . History of gastritis    EGD 2015  . History of home oxygen therapy    2 liters at hs last 6 months  . Hyperlipidemia   . Hypothyroidism   . Migraines   . Osteoarthritis    oa  . Scoliosis     Current Outpatient Medications on File Prior to Visit  Medication Sig Dispense Refill  . albuterol (PROAIR HFA) 108 (90 Base) MCG/ACT inhaler 2 puffs every 4 hours as needed only  if your can't catch your breath (Patient taking differently: Inhale 2 puffs into the lungs every 4 (four) hours as needed for wheezing or shortness of breath. ) 1 Inhaler 1  . ALPRAZolam (XANAX) 1 MG tablet TAKE 1 TABLET BY MOUTH THREE TIMES DAILY AS NEEDED FOR ANXIETY 90 tablet 0  . atorvastatin (LIPITOR) 20 MG tablet Take 1 tablet (20 mg total) by mouth at bedtime. 90 tablet 1  . buPROPion (WELLBUTRIN XL) 150 MG 24 hr tablet Take 1 tablet (150 mg total) by mouth daily. 30 tablet 1  . ferrous sulfate 325 (65 FE) MG EC tablet Take 325 mg by mouth daily with breakfast.     . fluticasone (FLONASE) 50 MCG/ACT nasal spray Place 2 sprays into both nostrils daily.    Marland Kitchen gabapentin (NEURONTIN)  300 MG capsule Take 600 mg by mouth 3 times daily    . HYDROcodone-acetaminophen (NORCO) 10-325 MG tablet TAKE 1 TABLET BY MOUTH THREE TIMES DAILY 90 tablet 0  . levothyroxine (SYNTHROID, LEVOTHROID) 75 MCG tablet TAKE 1 TABLET BY MOUTH DAILY BEFORE BREAKFAST 90 tablet 1  . loratadine (CLARITIN) 10 MG tablet Take 10 mg by mouth daily.    Marland Kitchen omeprazole (PRILOSEC) 40 MG capsule TAKE 1 CAPSULE BY MOUTH BEFORE BREAKFAST AND TAKE 1 CAPSULE BY MOUTH BEFORE DINNER 60 capsule 3  . OXYGEN Inhale 2 L into the lungs at bedtime.     . potassium chloride (K-DUR,KLOR-CON) 10 MEQ tablet TAKE 1 TABLET BY MOUTH AT BEDTIME 90 tablet 1  . ranitidine (ZANTAC) 150 MG tablet Take 1 tablet (150 mg total) by mouth 2 (two) times daily. 180 tablet 2  . sertraline (ZOLOFT) 100 MG tablet TAKE TWO TABLETS BY MOUTH AT BEDTIME 180 tablet 1  . SYMBICORT 160-4.5 MCG/ACT inhaler INHALE TWO PUFFS BY MOUTH TWICE DAILY 10.2 g 0  . triamterene-hydrochlorothiazide (DYAZIDE) 37.5-25 MG capsule Take 1 each (1 capsule total) by mouth daily. 90 capsule 0  . triamterene-hydrochlorothiazide (MAXZIDE-25) 37.5-25 MG tablet TAKE 1 TABLET BY MOUTH EVERY DAY 90 tablet 1  . umeclidinium bromide (INCRUSE ELLIPTA) 62.5 MCG/INH AEPB Inhale 1 puff  into the lungs daily. 1 each 5   No current facility-administered medications on file prior to visit.     Allergies  Allergen Reactions  . Nsaids Diarrhea  . Aleve [Naproxen Sodium] Other (See Comments)    Headache   . Codeine Nausea Only and Other (See Comments)    GI upset  . Penicillins Nausea Only and Other (See Comments)    GI upset Has patient had a PCN reaction causing immediate rash, facial/tongue/throat swelling, SOB or lightheadedness with hypotension: No Has patient had a PCN reaction causing severe rash involving mucus membranes or skin necrosis: No Has patient had a PCN reaction that required hospitalization No Has patient had a PCN reaction occurring within the last 10 years: No If  all of the above answers are "NO", then may proceed with Cephalosporin use.   . Sulfonamide Derivatives Hives    Family History  Problem Relation Age of Onset  . COPD Mother   . Heart disease Mother   . Lung disease Father        Asbestosis  . Heart attack Father   . Heart disease Father   . Cerebral aneurysm Brother   . Aneurysm Brother        Brain  . Epilepsy Son   . Arthritis Maternal Grandmother   . Heart disease Maternal Grandmother   . Asthma Maternal Grandfather   . Cancer Maternal Grandfather   . Arthritis Paternal Grandmother   . Heart disease Paternal Grandmother   . Stroke Paternal Grandmother   . Early death Paternal Grandfather   . Heart disease Paternal Grandfather     Social History   Socioeconomic History  . Marital status: Married    Spouse name: Not on file  . Number of children: Not on file  . Years of education: Not on file  . Highest education level: Not on file  Occupational History  . Not on file  Social Needs  . Financial resource strain: Not on file  . Food insecurity:    Worry: Not on file    Inability: Not on file  . Transportation needs:    Medical: Not on file    Non-medical: Not on file  Tobacco Use  . Smoking status: Current Every Day Smoker    Packs/day: 1.50    Years: 46.00    Pack years: 69.00    Types: Cigarettes  . Smokeless tobacco: Never Used  Substance and Sexual Activity  . Alcohol use: No  . Drug use: No  . Sexual activity: Yes  Lifestyle  . Physical activity:    Days per week: Not on file    Minutes per session: Not on file  . Stress: Not on file  Relationships  . Social connections:    Talks on phone: Not on file    Gets together: Not on file    Attends religious service: Not on file    Active member of club or organization: Not on file    Attends meetings of clubs or organizations: Not on file    Relationship status: Not on file  Other Topics Concern  . Not on file  Social History Narrative  . Not on  file   Review of Systems - See HPI.  All other ROS are negative.  BP 122/80   Pulse 91   Temp 98 F (36.7 C) (Oral)   Resp 16   Ht 5' 2"  (1.575 m)   Wt 261 lb 12.8 oz (118.8 kg)   SpO2  98%   BMI 47.88 kg/m   Physical Exam Vitals signs reviewed.  Constitutional:      Appearance: Normal appearance.  HENT:     Head: Normocephalic and atraumatic.     Right Ear: Tympanic membrane normal.     Left Ear: Tympanic membrane normal.     Nose: Nose normal.     Mouth/Throat:     Mouth: Mucous membranes are moist.  Eyes:     Conjunctiva/sclera: Conjunctivae normal.     Pupils: Pupils are equal, round, and reactive to light.  Neck:     Musculoskeletal: Normal range of motion and neck supple.  Cardiovascular:     Rate and Rhythm: Normal rate and regular rhythm.     Pulses: Normal pulses.     Heart sounds: Normal heart sounds.  Pulmonary:     Effort: No respiratory distress.     Breath sounds: No stridor. Wheezing present. No rhonchi.  Chest:     Chest wall: No tenderness.  Neurological:     Mental Status: She is alert.  Psychiatric:        Mood and Affect: Mood normal.     Recent Results (from the past 2160 hour(s))  Comp Met (CMET)     Status: Abnormal   Collection Time: 11/24/17  4:01 PM  Result Value Ref Range   Sodium 138 135 - 145 mEq/L   Potassium 3.7 3.5 - 5.1 mEq/L   Chloride 102 96 - 112 mEq/L   CO2 30 19 - 32 mEq/L   Glucose, Bld 88 70 - 99 mg/dL   BUN 16 6 - 23 mg/dL   Creatinine, Ser 1.19 0.40 - 1.20 mg/dL   Total Bilirubin 0.3 0.2 - 1.2 mg/dL   Alkaline Phosphatase 60 39 - 117 U/L   AST 31 0 - 37 U/L   ALT 24 0 - 35 U/L   Total Protein 7.0 6.0 - 8.3 g/dL   Albumin 3.7 3.5 - 5.2 g/dL   Calcium 8.7 8.4 - 10.5 mg/dL   GFR 48.88 (L) >60.00 mL/min  TSH     Status: None   Collection Time: 11/24/17  4:01 PM  Result Value Ref Range   TSH 4.11 0.35 - 4.50 uIU/mL  Alpha-1 antitrypsin phenotype     Status: None   Collection Time: 12/05/17  3:13 PM  Result  Value Ref Range   A-1 Antitrypsin, Ser 174 83 - 199 mg/dL   ALPHA-1-ANTITRYPSIN (AAT) PHENOTYPE SEE NOTE     Comment: THIS PATIENT'S ALPHA-1-ANTITRYPSIN PHENOTYPE IS PI*MM. Marland Kitchen 90% of normal individuals have the MM phenotype, with normal quantitative AAT levels. Many phenotypic patterns have been described, including deficiency states with F, S, Z, or other alleles. As a general estimation, compared to M allele of 100% of normal A-1-Antitrypsin protein, the S allele produces approximately 60% and the Z allele 20%. For example, an MS phenotype would have about 80% of normal A-1-Antitrypsin protein level, a 50% contribution from the M allele and 30% from the S allele. A ZZ phenotype would have about 20% of normal levels, a 10% contribution from each Z gene. The F allele has normal A-1-Antitrypsin levels, but the kinetics of elastase inhibition is not as efficient as an M allele product; F alleles should be considered functionally mildly deficient. Other variants are identifiable by phenotypic analysis. These include CM, DP, EM, GM, IS, LM, M1M2, M3M3, MP, MT, XX, MY, and M1N. I, P, T and  null alleles are considered deleterious. C, D, E, G,  L, M1, M2, M3, X and Y alleles are generally considered normal variants. The MZ-Pratt phenotype is a normal variant; care should be taken to avoid confusion with the deficient MZ phenotype.   Pain Mgmt, Profile 8 w/Conf, U     Status: Abnormal   Collection Time: 12/11/17  1:27 PM  Result Value Ref Range   Creatinine 199.3 > or = 20. mg/dL   pH 6.84 4.5 - 9.0   Oxidant NEGATIVE <200 mcg/mL   Amphetamines NEGATIVE <500 ng/mL   medMATCH Amphetamines CONSISTENT    Benzodiazepines POSITIVE (A) <100 ng/mL   Alphahydroxyalprazolam 560 (H) <25 ng/mL    Comment: See Note 1   medMATCH aOH alprazolam INCONSISTENT    Alphahydroxymidazolam NEGATIVE <50 ng/mL    Comment: See Note 1   medMATCH aOH midazolam CONSISTENT    Alphahydroxytriazolam NEGATIVE <50  ng/mL    Comment: See Note 1   medMATCH aOH triazolam CONSISTENT    Aminoclonazepam NEGATIVE <25 ng/mL    Comment: See Note 1   medMATCH Aminoclonazepam CONSISTENT    Hydroxyethylflurazepam NEGATIVE <50 ng/mL    Comment: See Note 1   medMATCH OH,Et flurazepam CONSISTENT    Lorazepam NEGATIVE <50 ng/mL    Comment: See Note 1   medMATCH Lorazepam CONSISTENT    Nordiazepam NEGATIVE <50 ng/mL    Comment: See Note 1   medMATCH Nordiazepam CONSISTENT    Oxazepam NEGATIVE <50 ng/mL    Comment: See Note 1   medMATCH Oxazepam CONSISTENT    Temazepam NEGATIVE <50 ng/mL    Comment: See Note 1   medMATCH Temazepam CONSISTENT    Marijuana Metabolite INTERFERENCE <20 ng/mL    Comment: . The above test was performed, but the test results could not be interpreted due to non-specific interference. .    Cocaine Metabolite NEGATIVE <150 ng/mL   [facility] Cocaine Metab CONSISTENT    Opiates POSITIVE (A) <100 ng/mL   Codeine NEGATIVE <50 ng/mL    Comment: See Note 1   medMATCH Codeine CONSISTENT    Hydrocodone 1,445 (H) <50 ng/mL    Comment: See Note 1   medMATCH Hydrocodone INCONSISTENT    Hydromorphone NEGATIVE <50 ng/mL    Comment: See Note 1   medMATCH Hydromorphone CONSISTENT    Morphine NEGATIVE <50 ng/mL    Comment: See Note 1   medMATCH Morphine CONSISTENT    Norhydrocodone 1,558 (H) <50 ng/mL    Comment: See Note 1   medMATCH Norhydrocodone INCONSISTENT     Comment: See Note 2   Oxycodone INTERFERENCE <100 ng/mL    Comment: . The above test was performed, but the test results could not be interpreted due to non-specific interference. .    Buprenorphine, Urine NEGATIVE CONFIRMED <5 ng/mL   Buprenorphine NEGATIVE <2 ng/mL    Comment: See Note 1   medMATCH Buprenorphine CONSISTENT    Norbuprenorphine NEGATIVE <2 ng/mL    Comment: See Note 1   medMATCH Norbuprenorphine CONSISTENT     Comment: Note 1 . This test was developed and its analytical performance    characteristics have been determined by [employer]. It has not been cleared or approved by the FDA. This assay has been validated pursuant to the CLIA  regulations and is used for clinical purposes. . Note 2 Norhydrocodone is a metabolite of Hydrocodone.    MDMA NEGATIVE <500 ng/mL   [facility] MDMA CONSISTENT    Alcohol Metabolites INTERFERENCE <500 ng/mL    Comment: . The above test was  performed, but the test results could not be interpreted due to non-specific interference. .    6 Acetylmorphine NEGATIVE <10 ng/mL   medMATCH 6 Acetylmorphine CONSISTENT     Comment: This drug testing is for medical treatment only.   Analysis was performed as non-forensic testing and  these results should be used only by healthcare  providers to render diagnosis or treatment, or to  monitor progress of medical conditions. Hazel Sams comments are:  - present when drug test results may be the result of     metabolism of one or more drugs or when results are     inconsistent with prescribed medication(s) listed.  - may be blank when drug results are consistent with     prescribed medication(s) listed. . For assistance with interpreting these drug results,  please contact a [employer] Toxicology  Specialist: [phone] [place] [phone]), M-F,  8am-6pm EST. This drug testing is for medical treatment only.   Analysis was performed as non-forensic testing and  these results should be used only by healthcare  providers to render diagnosis or treatment, or to  monitor progress of medical conditions. Hazel Sams comments are:  - present when  drug test results may be the result of     metabolism of one or more drugs or when results are     inconsistent with prescribed medication(s) listed.  - may be blank when drug results are consistent with     prescribed medication(s) listed. . For assistance with interpreting these drug results,  please contact a [employer]  Toxicology  Specialist: [phone] [place] [phone]), M-F,  8am-6pm EST. This drug testing is for medical treatment only.   Analysis was performed as non-forensic testing and  these results should be used only by healthcare  providers to render diagnosis or treatment, or to  monitor progress of medical conditions. Hazel Sams comments are:  - present when drug test results may be the result of     metabolism of one or more drugs or when results are     inconsistent with prescribed medication(s) listed.  - may be blank when drug results are consistent with     prescribed medication(s) listed. . For assistance with interpreting these d rug results,  please contact a [employer] Toxicology  Specialist: [phone] [place] [phone]), M-F,  8am-6pm EST. This drug testing is for medical treatment only.   Analysis was performed as non-forensic testing and  these results should be used only by healthcare  providers to render diagnosis or treatment, or to  monitor progress of medical conditions. Hazel Sams comments are:  - present when drug test results may be the result of     metabolism of one or more drugs or when results are     inconsistent with prescribed medication(s) listed.  - may be blank when drug results are consistent with     prescribed medication(s) listed. . For assistance with interpreting these drug results,  please contact a [employer] Toxicology  Specialist: [phone] [place] [phone]), M-F,  8am-6pm EST. This drug testing is for medical treatment only.   Analysis was performed as non-forensic testing and  these results should be used only by healthcare  provi ders to render diagnosis or treatment, or to  monitor progress of medical conditions. Hazel Sams comments are:  - present when drug test results may be the result of     metabolism of one or more drugs or when results are  inconsistent with prescribed medication(s) listed.   - may be blank when drug results are consistent with     prescribed medication(s) listed. . For assistance with interpreting these drug results,  please contact a [employer] Toxicology  Specialist: [phone] [place] [phone]), M-F,  8am-6pm EST.    Assessment/Plan: 1. Wheezing Alb neb given in office with improvement in breathing. Start Prednisone 40 mg QD x 5 days for mild COPD exacerbation. Continue chronic medication regimen. Follow-up if not improving.  - albuterol (PROVENTIL) (2.5 MG/3ML) 0.083% nebulizer solution 2.5 mg - predniSONE (DELTASONE) 20 MG tablet; Take 2 tablets (40 mg total) by mouth daily with breakfast.  Dispense: 10 tablet; Refill: 0  2. Anxiety and depression Increase Wellbutrin to 300 mg daily. Continue current dose of Sertraline. - buPROPion (WELLBUTRIN XL) 300 MG 24 hr tablet; Take 1 tablet (300 mg total) by mouth daily.  Dispense: 30 tablet; Refill: [place], [place]

## 2018-01-19 NOTE — Patient Instructions (Signed)
Please use the prednisone burst as directed for the next 5-days. Continue chronic medications as directed.  Keep hydrated and get plenty of rest.   Let me know if this is not easing up.  Please increase the Wellbutrin to the new dose of 300 mg daily. I have sent in a new script. We will follow-up in 6-8 weeks. If things are not improving please call me!

## 2018-01-30 ENCOUNTER — Other Ambulatory Visit: Payer: Self-pay | Admitting: Internal Medicine

## 2018-02-09 ENCOUNTER — Telehealth: Payer: Self-pay | Admitting: Adult Health

## 2018-02-09 NOTE — Telephone Encounter (Signed)
Caryl Pina with [employer] is aware of below message and voiced his understanding.  Caryl Pina stated that pt will need OV within 30 days of the 23min walk. Caryl Pina stated that within that note it needs to state pt's need for 24hr oxygen. lmtcb x1 for pt to schedule OV and 79min walk.

## 2018-02-09 NOTE — Telephone Encounter (Signed)
Fine with me

## 2018-02-09 NOTE — Telephone Encounter (Addendum)
Called and spoke to [place] with [facility], who is requesting 65min walk to qualify pt for 24hr oxygen as pt is now with medicare. Caryl Pina stated that [employer] suggested cpap titration to qualify, however pt states that she does not wear cpap.  MW please advise if okay to order six minute walk. Thanks  01/12/18 AVS:  Prior Versions: 1. Parrett, Fonnie Mu, NP (Nurse Practitioner) at 01/12/2018 3:11 PM - Signed    Continue on Symbicort 2 puffs Twice daily  , rinse after use.  Continue INCRUSE 1 puff daily . Rinse after use .  Continue on Oxygen 2l/m At bedtime   Follow up in 3 months with Dr. Melvyn Novas  Or Parrett NP  Please contact office for sooner follow up if symptoms do not improve or worsen or seek emergency care

## 2018-02-10 NOTE — Telephone Encounter (Signed)
Spoke with pt, she has an appt on 04/12/2017 at 3pm with TP. I scheduled a 6 min walk before the appt at 2:30. Nothing further is needed.

## 2018-02-12 ENCOUNTER — Other Ambulatory Visit: Payer: Self-pay | Admitting: Physician Assistant

## 2018-02-12 NOTE — Telephone Encounter (Signed)
Indication for chronic opioid: Chronic pain Medication and dose: Hydrocodone-Acet 10/325 mg # pills per month: #90 Last UDS date: 12/11/17 Opioid Treatment Agreement signed (Y/N): Yes Opioid Treatment Agreement last reviewed with patient:   [facility] reviewed this encounter (include red flags):     Xanax last filled 01/13/18 #90 Last OV: 01/19/18   Please advise

## 2018-02-19 ENCOUNTER — Other Ambulatory Visit: Payer: Self-pay | Admitting: Gastroenterology

## 2018-02-20 ENCOUNTER — Telehealth: Payer: Self-pay | Admitting: Gastroenterology

## 2018-02-20 NOTE — Telephone Encounter (Signed)
Siera called in for the pat medication ranitidine (ZANTAC) 150 MG tablet [[id number]]  Stating tht it is on recall and an alternative need to be called in

## 2018-02-23 ENCOUNTER — Other Ambulatory Visit: Payer: Self-pay | Admitting: Gastroenterology

## 2018-02-23 MED ORDER — FAMOTIDINE 20 MG PO TABS: 20.0000 mg | ORAL_TABLET | Freq: Two times a day (BID) | ORAL | 11 refills | Status: DC

## 2018-02-23 NOTE — Telephone Encounter (Signed)
Spoke to patient who currently takes zantac 150mg  bid. She is requesting an alternative. Pepcid 20mg  bid sent to pharmacy. Patient stated she wanted 20mg  and take two at bedtime instead of 40 daily at HS.

## 2018-02-26 ENCOUNTER — Other Ambulatory Visit: Payer: Self-pay

## 2018-02-26 ENCOUNTER — Ambulatory Visit (INDEPENDENT_AMBULATORY_CARE_PROVIDER_SITE_OTHER): Payer: Managed Care, Other (non HMO) | Admitting: Physician Assistant

## 2018-02-26 ENCOUNTER — Ambulatory Visit (INDEPENDENT_AMBULATORY_CARE_PROVIDER_SITE_OTHER): Payer: Managed Care, Other (non HMO)

## 2018-02-26 ENCOUNTER — Encounter: Payer: Self-pay | Admitting: Physician Assistant

## 2018-02-26 VITALS — BP 120/80 | HR 88 | Temp 98.5°F | Resp 15 | Ht 62.0 in | Wt 257.0 lb

## 2018-02-26 DIAGNOSIS — J441 Chronic obstructive pulmonary disease with (acute) exacerbation: Secondary | ICD-10-CM

## 2018-02-26 LAB — POC INFLUENZA A&B (BINAX/QUICKVUE)
Influenza A, POC: NEGATIVE
Influenza B, POC: NEGATIVE

## 2018-02-26 MED ORDER — DOXYCYCLINE HYCLATE 100 MG PO CAPS: 100.0000 mg | ORAL_CAPSULE | Freq: Two times a day (BID) | ORAL | 0 refills | Status: DC

## 2018-02-26 MED ORDER — IPRATROPIUM-ALBUTEROL 0.5-2.5 (3) MG/3ML IN SOLN
3.0000 mL | Freq: Once | RESPIRATORY_TRACT | Status: AC
  Administered 2018-02-26: 3 mL via RESPIRATORY_TRACT

## 2018-02-26 MED ORDER — PREDNISONE 20 MG PO TABS: 40.0000 mg | ORAL_TABLET | Freq: Every day | ORAL | 0 refills | Status: DC

## 2018-02-26 MED ORDER — BENZONATATE 100 MG PO CAPS: 100.0000 mg | ORAL_CAPSULE | Freq: Three times a day (TID) | ORAL | 0 refills | Status: DC | PRN

## 2018-02-26 NOTE — Patient Instructions (Signed)
Please go to the [facility] office for x-ray. We will call you with your results and alter treatment accordingly.   [place]  [place], [place] [address]  Take the steroid and antibiotic as directed. Mucinex for cough. Continue inhalers.  Keep hydrated and get plenty of rest.   Keep close eye on Oxygen levels at home. If not staying above 90 consistently or breathing worsens, you need to go to the ER.

## 2018-02-26 NOTE — Progress Notes (Signed)
Patient presents to clinic today c/o several days of cough, chills, chest congestion, significant wheezing and SOB. Denies recent travel or sick contact. Notes symptoms worsening since onset. Has been using her chronic inhalers as directed but without much improvement.   Past Medical History:  Diagnosis Date  . Allergic rhinitis   . Anemia   . Anxiety   . Chicken pox   . Chronic back pain   . COPD (chronic obstructive pulmonary disease) ([place])   . Depression   . Essential hypertension   . GERD (gastroesophageal reflux disease)   . Headache    migraines  . History of gastritis    EGD 2015  . History of home oxygen therapy    2 liters at hs last 6 months  . Hyperlipidemia   . Hypothyroidism   . Migraines   . Osteoarthritis    oa  . Scoliosis     Current Outpatient Medications on File Prior to Visit  Medication Sig Dispense Refill  . albuterol (PROAIR HFA) 108 (90 Base) MCG/ACT inhaler 2 puffs every 4 hours as needed only  if your can't catch your breath (Patient taking differently: Inhale 2 puffs into the lungs every 4 (four) hours as needed for wheezing or shortness of breath. ) 1 Inhaler 1  . ALPRAZolam (XANAX) 1 MG tablet TAKE 1 TABLET BY MOUTH THREE TIMES DAILY AS NEEDED FOR ANXIETY 90 tablet 0  . atorvastatin (LIPITOR) 20 MG tablet Take 1 tablet (20 mg total) by mouth at bedtime. 90 tablet 1  . buPROPion (WELLBUTRIN XL) 300 MG 24 hr tablet Take 1 tablet (300 mg total) by mouth daily. 30 tablet 1  . famotidine (PEPCID) 20 MG tablet Take 1 tablet (20 mg total) by mouth 2 (two) times daily. 60 tablet 11  . ferrous sulfate 325 (65 FE) MG EC tablet Take 325 mg by mouth daily with breakfast.     . fluticasone (FLONASE) 50 MCG/ACT nasal spray Place 2 sprays into both nostrils daily.    Marland Kitchen gabapentin (NEURONTIN) 300 MG capsule Take 600 mg by mouth 3 times daily    . HYDROcodone-acetaminophen (NORCO) 10-325 MG tablet TAKE 1 TABLET BY MOUTH THREE TIMES DAILY 90 tablet 0  .  levothyroxine (SYNTHROID, LEVOTHROID) 75 MCG tablet TAKE 1 TABLET BY MOUTH DAILY BEFORE BREAKFAST 90 tablet 1  . loratadine (CLARITIN) 10 MG tablet Take 10 mg by mouth daily.    Marland Kitchen omeprazole (PRILOSEC) 40 MG capsule TAKE 1 CAPSULE BY MOUTH BEFORE BREAKFAST AND TAKE 1 CAPSULE BY MOUTH BEFORE DINNER 60 capsule 3  . OXYGEN Inhale 2 L into the lungs at bedtime.     . potassium chloride (K-DUR,KLOR-CON) 10 MEQ tablet TAKE 1 TABLET BY MOUTH AT BEDTIME 90 tablet 1  . sertraline (ZOLOFT) 100 MG tablet TAKE TWO TABLETS BY MOUTH AT BEDTIME 180 tablet 1  . SYMBICORT 160-4.5 MCG/ACT inhaler INHALE TWO PUFFS BY MOUTH TWICE DAILY 10.2 g 3  . triamterene-hydrochlorothiazide (MAXZIDE-25) 37.5-25 MG tablet TAKE 1 TABLET BY MOUTH EVERY DAY 90 tablet 1  . umeclidinium bromide (INCRUSE ELLIPTA) 62.5 MCG/INH AEPB Inhale 1 puff into the lungs daily. 1 each 5   Current Facility-Administered Medications on File Prior to Visit  Medication Dose Route Frequency Provider Last Rate Last Dose  . albuterol (PROVENTIL) (2.5 MG/3ML) 0.083% nebulizer solution 2.5 mg  2.5 mg Nebulization Once Brunetta Jeans, PA-C        Allergies  Allergen Reactions  . Nsaids Diarrhea  . Aleve [Naproxen  Sodium] Other (See Comments)    Headache   . Codeine Nausea Only and Other (See Comments)    GI upset  . Penicillins Nausea Only and Other (See Comments)    GI upset Has patient had a PCN reaction causing immediate rash, facial/tongue/throat swelling, SOB or lightheadedness with hypotension: No Has patient had a PCN reaction causing severe rash involving mucus membranes or skin necrosis: No Has patient had a PCN reaction that required hospitalization No Has patient had a PCN reaction occurring within the last 10 years: No If all of the above answers are "NO", then may proceed with Cephalosporin use.   . Sulfonamide Derivatives Hives    Family History  Problem Relation Age of Onset  . COPD Mother   . Heart disease Mother   .  Lung disease Father        Asbestosis  . Heart attack Father   . Heart disease Father   . Cerebral aneurysm Brother   . Aneurysm Brother        Brain  . Epilepsy Son   . Arthritis Maternal Grandmother   . Heart disease Maternal Grandmother   . Asthma Maternal Grandfather   . Cancer Maternal Grandfather   . Arthritis Paternal Grandmother   . Heart disease Paternal Grandmother   . Stroke Paternal Grandmother   . Early death Paternal Grandfather   . Heart disease Paternal Grandfather     Social History   Socioeconomic History  . Marital status: Married    Spouse name: Not on file  . Number of children: Not on file  . Years of education: Not on file  . Highest education level: Not on file  Occupational History  . Not on file  Social Needs  . Financial resource strain: Not on file  . Food insecurity:    Worry: Not on file    Inability: Not on file  . Transportation needs:    Medical: Not on file    Non-medical: Not on file  Tobacco Use  . Smoking status: Current Every Day Smoker    Packs/day: 1.50    Years: 46.00    Pack years: 69.00    Types: Cigarettes  . Smokeless tobacco: Never Used  Substance and Sexual Activity  . Alcohol use: No  . Drug use: No  . Sexual activity: Yes  Lifestyle  . Physical activity:    Days per week: Not on file    Minutes per session: Not on file  . Stress: Not on file  Relationships  . Social connections:    Talks on phone: Not on file    Gets together: Not on file    Attends religious service: Not on file    Active member of club or organization: Not on file    Attends meetings of clubs or organizations: Not on file    Relationship status: Not on file  Other Topics Concern  . Not on file  Social History Narrative  . Not on file   Review of Systems - See HPI.  All other ROS are negative.  BP 120/80   Pulse 88   Temp 98.5 F (36.9 C) (Oral)   Resp 15   Ht 5\' 2"  (1.575 m)   Wt 257 lb (116.6 kg)   SpO2 94%   BMI 47.01  kg/m   Physical Exam Vitals signs reviewed.  Constitutional:      Appearance: Normal appearance.  HENT:     Head: Normocephalic and atraumatic.  Right Ear: Tympanic membrane normal.     Left Ear: Tympanic membrane normal.     Nose: Congestion present.     Mouth/Throat:     Mouth: Mucous membranes are moist.  Eyes:     Conjunctiva/sclera: Conjunctivae normal.  Neck:     Musculoskeletal: Neck supple.  Cardiovascular:     Rate and Rhythm: Normal rate and regular rhythm.     Pulses: Normal pulses.     Heart sounds: Normal heart sounds.  Pulmonary:     Effort: Pulmonary effort is normal.     Breath sounds: Wheezing present.  Lymphadenopathy:     Cervical: No cervical adenopathy.  Neurological:     General: No focal deficit present.     Mental Status: She is alert and oriented to person, place, and time.    Recent Results (from the past 2160 hour(s))  Alpha-1 antitrypsin phenotype     Status: None   Collection Time: 12/05/17  3:13 PM  Result Value Ref Range   A-1 Antitrypsin, Ser 174 83 - 199 mg/dL   ALPHA-1-ANTITRYPSIN (AAT) PHENOTYPE SEE NOTE     Comment: THIS PATIENT'S ALPHA-1-ANTITRYPSIN PHENOTYPE IS PI*MM. Marland Kitchen 90% of normal individuals have the MM phenotype, with normal quantitative AAT levels. Many phenotypic patterns have been described, including deficiency states with F, S, Z, or other alleles. As a general estimation, compared to M allele of 100% of normal A-1-Antitrypsin protein, the S allele produces approximately 60% and the Z allele 20%. For example, an MS phenotype would have about 80% of normal A-1-Antitrypsin protein level, a 50% contribution from the M allele and 30% from the S allele. A ZZ phenotype would have about 20% of normal levels, a 10% contribution from each Z gene. The F allele has normal A-1-Antitrypsin levels, but the kinetics of elastase inhibition is not as efficient as an M allele product; F alleles should be considered functionally  mildly deficient. Other variants are identifiable by phenotypic analysis. These include CM, DP, EM, GM, IS, LM, M1M2, M3M3, MP, MT, XX, MY, and M1N. I, P, T and  null alleles are considered deleterious. C, D, E, G, L, M1, M2, M3, X and Y alleles are generally considered normal variants. The MZ-Pratt phenotype is a normal variant; care should be taken to avoid confusion with the deficient MZ phenotype.   Pain Mgmt, Profile 8 w/Conf, U     Status: Abnormal   Collection Time: 12/11/17  1:27 PM  Result Value Ref Range   Creatinine 199.3 > or = 20. mg/dL   pH 6.84 4.5 - 9.0   Oxidant NEGATIVE <200 mcg/mL   Amphetamines NEGATIVE <500 ng/mL   medMATCH Amphetamines CONSISTENT    Benzodiazepines POSITIVE (A) <100 ng/mL   Alphahydroxyalprazolam 560 (H) <25 ng/mL    Comment: See Note 1   medMATCH aOH alprazolam INCONSISTENT    Alphahydroxymidazolam NEGATIVE <50 ng/mL    Comment: See Note 1   medMATCH aOH midazolam CONSISTENT    Alphahydroxytriazolam NEGATIVE <50 ng/mL    Comment: See Note 1   medMATCH aOH triazolam CONSISTENT    Aminoclonazepam NEGATIVE <25 ng/mL    Comment: See Note 1   medMATCH Aminoclonazepam CONSISTENT    Hydroxyethylflurazepam NEGATIVE <50 ng/mL    Comment: See Note 1   medMATCH OH,Et flurazepam CONSISTENT    Lorazepam NEGATIVE <50 ng/mL    Comment: See Note 1   medMATCH Lorazepam CONSISTENT    Nordiazepam NEGATIVE <50 ng/mL    Comment: See Note 1  medMATCH Nordiazepam CONSISTENT    Oxazepam NEGATIVE <50 ng/mL    Comment: See Note 1   medMATCH Oxazepam CONSISTENT    Temazepam NEGATIVE <50 ng/mL    Comment: See Note 1   medMATCH Temazepam CONSISTENT    Marijuana Metabolite INTERFERENCE <20 ng/mL    Comment: . The above test was performed, but the test results could not be interpreted due to non-specific interference. .    Cocaine Metabolite NEGATIVE <150 ng/mL   [facility] Cocaine Metab CONSISTENT    Opiates POSITIVE (A) <100 ng/mL   Codeine NEGATIVE  <50 ng/mL    Comment: See Note 1   medMATCH Codeine CONSISTENT    Hydrocodone 1,445 (H) <50 ng/mL    Comment: See Note 1   medMATCH Hydrocodone INCONSISTENT    Hydromorphone NEGATIVE <50 ng/mL    Comment: See Note 1   medMATCH Hydromorphone CONSISTENT    Morphine NEGATIVE <50 ng/mL    Comment: See Note 1   medMATCH Morphine CONSISTENT    Norhydrocodone 1,558 (H) <50 ng/mL    Comment: See Note 1   medMATCH Norhydrocodone INCONSISTENT     Comment: See Note 2   Oxycodone INTERFERENCE <100 ng/mL    Comment: . The above test was performed, but the test results could not be interpreted due to non-specific interference. .    Buprenorphine, Urine NEGATIVE CONFIRMED <5 ng/mL   Buprenorphine NEGATIVE <2 ng/mL    Comment: See Note 1   medMATCH Buprenorphine CONSISTENT    Norbuprenorphine NEGATIVE <2 ng/mL    Comment: See Note 1   medMATCH Norbuprenorphine CONSISTENT     Comment: Note 1 . This test was developed and its analytical performance  characteristics have been determined by [employer]. It has not been cleared or approved by the FDA. This assay has been validated pursuant to the CLIA  regulations and is used for clinical purposes. . Note 2 Norhydrocodone is a metabolite of Hydrocodone.    MDMA NEGATIVE <500 ng/mL   [facility] MDMA CONSISTENT    Alcohol Metabolites INTERFERENCE <500 ng/mL    Comment: . The above test was performed, but the test results could not be interpreted due to non-specific interference. .    6 Acetylmorphine NEGATIVE <10 ng/mL   medMATCH 6 Acetylmorphine CONSISTENT     Comment: This drug testing is for medical treatment only.   Analysis was performed as non-forensic testing and  these results should be used only by healthcare  providers to render diagnosis or treatment, or to  monitor progress of medical conditions. Hazel Sams comments are:  - present when drug test results may be the result of     metabolism of one or more drugs or  when results are     inconsistent with prescribed medication(s) listed.  - may be blank when drug results are consistent with     prescribed medication(s) listed. . For assistance with interpreting these drug results,  please contact a [employer] Toxicology  Specialist: [phone] [place] [phone]), M-F,  8am-6pm EST. This drug testing is for medical treatment only.   Analysis was performed as non-forensic testing and  these results should be used only by healthcare  providers to render diagnosis or treatment, or to  monitor progress of medical conditions. Hazel Sams comments are:  - present when  drug test results may be the result of     metabolism of one or more drugs or when results are     inconsistent with prescribed medication(s)  listed.  - may be blank when drug results are consistent with     prescribed medication(s) listed. . For assistance with interpreting these drug results,  please contact a [employer] Toxicology  Specialist: [phone] [place] [phone]), M-F,  8am-6pm EST. This drug testing is for medical treatment only.   Analysis was performed as non-forensic testing and  these results should be used only by healthcare  providers to render diagnosis or treatment, or to  monitor progress of medical conditions. Hazel Sams comments are:  - present when drug test results may be the result of     metabolism of one or more drugs or when results are     inconsistent with prescribed medication(s) listed.  - may be blank when drug results are consistent with     prescribed medication(s) listed. . For assistance with interpreting these d rug results,  please contact a [employer] Toxicology  Specialist: [phone] [place] [phone]), M-F,  8am-6pm EST. This drug testing is for medical treatment only.   Analysis was performed as non-forensic testing and  these results should be used only by healthcare  providers to render diagnosis  or treatment, or to  monitor progress of medical conditions. Hazel Sams comments are:  - present when drug test results may be the result of     metabolism of one or more drugs or when results are     inconsistent with prescribed medication(s) listed.  - may be blank when drug results are consistent with     prescribed medication(s) listed. . For assistance with interpreting these drug results,  please contact a [employer] Toxicology  Specialist: [phone] [place] [phone]), M-F,  8am-6pm EST. This drug testing is for medical treatment only.   Analysis was performed as non-forensic testing and  these results should be used only by healthcare  provi ders to render diagnosis or treatment, or to  monitor progress of medical conditions. Hazel Sams comments are:  - present when drug test results may be the result of     metabolism of one or more drugs or when results are     inconsistent with prescribed medication(s) listed.  - may be blank when drug results are consistent with     prescribed medication(s) listed. . For assistance with interpreting these drug results,  please contact a [employer] Toxicology  Specialist: [phone] [place] [phone]), M-F,  8am-6pm EST.   POC Influenza A&B(BINAX/QUICKVUE)     Status: Normal   Collection Time: 02/26/18 12:04 PM  Result Value Ref Range   Influenza A, POC Negative Negative   Influenza B, POC Negative Negative    Assessment/Plan: 1. COPD exacerbation ([place]) Giving her history, current symptoms and variable O2 sats, recommend ER assessment but patient refuses AMA. Able to get O2 up to 94% while sitting after duoneb but worried about ambulatory hypoxia. Again refuses ER. Flu swab was negative. STAT CXR ordered. Start Prednisone burst and Doxycycline. Has home pulse oximeter. Will record and if not continuing to keep above 92 at home, will go to ER.   - ipratropium-albuterol (DUONEB) 0.5-2.5 (3) MG/3ML nebulizer  solution 3 mL - POC Influenza A&B(BINAX/QUICKVUE) - predniSONE (DELTASONE) 20 MG tablet; Take 2 tablets (40 mg total) by mouth daily with breakfast.  Dispense: 10 tablet; Refill: 0 - doxycycline (VIBRAMYCIN) 100 MG capsule; Take 1 capsule (100 mg total) by mouth 2 (two) times daily.  Dispense: 14 capsule; Refill: 0 - benzonatate (TESSALON) 100 MG capsule; Take 1 capsule (100 mg total)  by mouth 3 (three) times daily as needed for cough.  Dispense: 30 capsule; Refill: 0 - DG Chest 2 View; Future   [name], PA-C

## 2018-03-02 ENCOUNTER — Ambulatory Visit: Payer: Managed Care, Other (non HMO) | Admitting: Physician Assistant

## 2018-03-08 ENCOUNTER — Other Ambulatory Visit: Payer: Self-pay | Admitting: Physician Assistant

## 2018-03-09 NOTE — Telephone Encounter (Signed)
Last refill: Xanax 02/12/18 #90, 0                  Norco 02/12/18 #90, 0 Last OV:02/26/18

## 2018-03-10 NOTE — Telephone Encounter (Signed)
UDS 11/2017 -- no red flags.  CS database reviewed today. No red flags.   Refills sent.

## 2018-03-22 ENCOUNTER — Other Ambulatory Visit: Payer: Self-pay | Admitting: Gastroenterology

## 2018-03-31 ENCOUNTER — Other Ambulatory Visit: Payer: Self-pay | Admitting: Physician Assistant

## 2018-03-31 NOTE — Telephone Encounter (Signed)
Last refill:03/10/18 #90, 0 Last OV:02/26/18

## 2018-03-31 NOTE — Telephone Encounter (Signed)
Indication for chronic opioid: Chronic pain syndrome Medication and dose: Norco 10/325 mg # pills per month: 90 on 03/10/18 Last UDS date: 12/11/17 Opioid Treatment Agreement signed (Y/N): Yes for Xanax Opioid Treatment Agreement last reviewed with patient:   [facility] reviewed this encounter (include red flags):     LOV:02/26/18  Please advise

## 2018-04-13 ENCOUNTER — Ambulatory Visit: Payer: Managed Care, Other (non HMO) | Admitting: Adult Health

## 2018-04-13 ENCOUNTER — Ambulatory Visit: Payer: Managed Care, Other (non HMO) | Admitting: Primary Care

## 2018-04-13 ENCOUNTER — Ambulatory Visit: Payer: Managed Care, Other (non HMO)

## 2018-04-23 ENCOUNTER — Telehealth: Payer: Self-pay | Admitting: Adult Health

## 2018-04-23 NOTE — Telephone Encounter (Signed)
OLM:[id number] Pt needed SMW and office visit. She was started on 02 by her pcp. Due to [place] office visit has been rescheduled from April to July. Is there going to be a grace period or will patient have to come in sooner.

## 2018-04-23 NOTE — Telephone Encounter (Signed)
Patient scheduled to see TP 3.30.2020 for O2 requalification Nothing further needed; will sign off

## 2018-04-27 ENCOUNTER — Ambulatory Visit (INDEPENDENT_AMBULATORY_CARE_PROVIDER_SITE_OTHER): Payer: Managed Care, Other (non HMO) | Admitting: Adult Health

## 2018-04-27 ENCOUNTER — Other Ambulatory Visit: Payer: Self-pay | Admitting: Physician Assistant

## 2018-04-27 ENCOUNTER — Encounter: Payer: Self-pay | Admitting: Adult Health

## 2018-04-27 ENCOUNTER — Other Ambulatory Visit: Payer: Self-pay

## 2018-04-27 VITALS — BP 118/82 | HR 92 | Ht 62.0 in | Wt 255.0 lb

## 2018-04-27 DIAGNOSIS — J9611 Chronic respiratory failure with hypoxia: Secondary | ICD-10-CM

## 2018-04-27 DIAGNOSIS — J449 Chronic obstructive pulmonary disease, unspecified: Secondary | ICD-10-CM | POA: Diagnosis not present

## 2018-04-27 NOTE — Progress Notes (Signed)
@Patient  ID: [name], female    DOB: 10-01-1955, 63 y.o.   MRN: [id number]  Chief Complaint  Patient presents with  . Follow-up    COPD     Referring provider: Brunetta Jeans, PA-C  63 year old female smoker seen for initial pulmonary consult February 2019 found to have gold 2 COPD with reversibilityandoxygen dependent respiratory failure on nocturnal oxygen at 2 L Is disabled from orthopedic issues.-severe hip issues  Medical hx of Dyslipidemia , HTN , anemia  TEST/EVENTS :  Spirometry 2/19/2019FEV1 1.54 (66%) Ratio 75 with min curvature p am symb - Allergy profile 03/18/17 >Eos 0.3 / IgE 4 neg RAST  - PFT's 4/29/2019FEV1 1.60 (63 % ) ratio 67 p 14 % improvement from saba p sym/ spriva prior to study with DLCO 55 % corrects to 63 % for alv volume with EOS up to 0.8 HRCT chest 12/24/2017-no ILD, mild to moderate emphysema Alpha-1 December 05, 2017 normal, MM, 174  04/27/2018 Follow up : COPD , O2 RF  Patient returns today for a 31-month follow-up.  Patient has underlying COPD.  She remains on Symbicort and Incruse.  Says overall breathing is doing about the same.  She gets winded with heavy activity.  She denies any flare of cough or wheezing.  She is still smoking.  We talked about smoking cessation however she is not interested.  Patient is on nocturnal oxygen at 2 L at bedtime.  Previous sleep study showed no sleep apnea.  She has been contacted by her insurance and homecare company that she needs an oxygen qualification.  We discussed that she will need her overnight oximetry on room air per her insurance requirements.   Allergies  Allergen Reactions  . Nsaids Diarrhea  . Aleve [Naproxen Sodium] Other (See Comments)    Headache   . Codeine Nausea Only and Other (See Comments)    GI upset  . Penicillins Nausea Only and Other (See Comments)    GI upset Has patient had a PCN reaction causing immediate rash, facial/tongue/throat swelling, SOB or  lightheadedness with hypotension: No Has patient had a PCN reaction causing severe rash involving mucus membranes or skin necrosis: No Has patient had a PCN reaction that required hospitalization No Has patient had a PCN reaction occurring within the last 10 years: No If all of the above answers are "NO", then may proceed with Cephalosporin use.   . Sulfonamide Derivatives Hives    Immunization History  Administered Date(s) Administered  . Influenza-Unspecified 01/16/2011, 11/26/2011, 12/22/2012, 11/17/2013    Past Medical History:  Diagnosis Date  . Allergic rhinitis   . Anemia   . Anxiety   . Chicken pox   . Chronic back pain   . COPD (chronic obstructive pulmonary disease) ([place])   . Depression   . Essential hypertension   . GERD (gastroesophageal reflux disease)   . Headache    migraines  . History of gastritis    EGD 2015  . History of home oxygen therapy    2 liters at hs last 6 months  . Hyperlipidemia   . Hypothyroidism   . Migraines   . Osteoarthritis    oa  . Scoliosis     Tobacco History: Social History   Tobacco Use  Smoking Status Current Every Day Smoker  . Packs/day: 1.50  . Years: 46.00  . Pack years: 69.00  . Types: Cigarettes  Smokeless Tobacco Never Used   Ready to quit: No Counseling given: Yes   Outpatient  Medications Prior to Visit  Medication Sig Dispense Refill  . albuterol (PROAIR HFA) 108 (90 Base) MCG/ACT inhaler 2 puffs every 4 hours as needed only  if your can't catch your breath (Patient taking differently: Inhale 2 puffs into the lungs every 4 (four) hours as needed for wheezing or shortness of breath. ) 1 Inhaler 1  . ALPRAZolam (XANAX) 1 MG tablet TAKE 1 TABLET BY MOUTH THREE TIMES DAILY AS NEEDED FOR ANXIETY 90 tablet 1  . atorvastatin (LIPITOR) 20 MG tablet Take 1 tablet (20 mg total) by mouth at bedtime. 90 tablet 1  . buPROPion (WELLBUTRIN XL) 300 MG 24 hr tablet Take 1 tablet (300 mg total) by mouth daily. 30 tablet 1  .  famotidine (PEPCID) 20 MG tablet Take 1 tablet (20 mg total) by mouth 2 (two) times daily. 60 tablet 11  . fluticasone (FLONASE) 50 MCG/ACT nasal spray Place 2 sprays into both nostrils daily.    Marland Kitchen gabapentin (NEURONTIN) 300 MG capsule Take 600 mg by mouth 3 times daily    . HYDROcodone-acetaminophen (NORCO) 10-325 MG tablet TAKE 1 TABLET BY MOUTH THREE TIMES DAILY 90 tablet 0  . levothyroxine (SYNTHROID, LEVOTHROID) 75 MCG tablet TAKE 1 TABLET BY MOUTH DAILY BEFORE BREAKFAST 90 tablet 1  . loratadine (CLARITIN) 10 MG tablet Take 10 mg by mouth daily.    Marland Kitchen omeprazole (PRILOSEC) 40 MG capsule TAKE 1 CAPSULE BY MOUTH BEFORE BREAKFAST AND TAKE 1 CAPSULE BY MOUTH BEFORE DINNER 60 capsule 3  . OXYGEN Inhale 2 L into the lungs at bedtime.     . potassium chloride (K-DUR,KLOR-CON) 10 MEQ tablet TAKE 1 TABLET BY MOUTH AT BEDTIME 90 tablet 1  . sertraline (ZOLOFT) 100 MG tablet TAKE TWO TABLETS BY MOUTH AT BEDTIME 180 tablet 1  . SYMBICORT 160-4.5 MCG/ACT inhaler INHALE TWO PUFFS BY MOUTH TWICE DAILY 10.2 g 3  . triamterene-hydrochlorothiazide (MAXZIDE-25) 37.5-25 MG tablet TAKE 1 TABLET BY MOUTH EVERY DAY 90 tablet 1  . umeclidinium bromide (INCRUSE ELLIPTA) 62.5 MCG/INH AEPB Inhale 1 puff into the lungs daily. 1 each 5  . benzonatate (TESSALON) 100 MG capsule Take 1 capsule (100 mg total) by mouth 3 (three) times daily as needed for cough. 30 capsule 0  . doxycycline (VIBRAMYCIN) 100 MG capsule Take 1 capsule (100 mg total) by mouth 2 (two) times daily. 14 capsule 0  . ferrous sulfate 325 (65 FE) MG EC tablet Take 325 mg by mouth daily with breakfast.     . predniSONE (DELTASONE) 20 MG tablet Take 2 tablets (40 mg total) by mouth daily with breakfast. 10 tablet 0   Facility-Administered Medications Prior to Visit  Medication Dose Route Frequency Provider Last Rate Last Dose  . albuterol (PROVENTIL) (2.5 MG/3ML) 0.083% nebulizer solution 2.5 mg  2.5 mg Nebulization Once Brunetta Jeans, PA-C          Review of Systems:   Constitutional:   No  weight loss, night sweats,  Fevers, chills, fatigue, or  lassitude.  HEENT:   No headaches,  Difficulty swallowing,  Tooth/dental problems, or  Sore throat,                No sneezing, itching, ear ache, nasal congestion, post nasal drip,   CV:  No chest pain,  Orthopnea, PND, swelling in lower extremities, anasarca, dizziness, palpitations, syncope.   GI  No heartburn, indigestion, abdominal pain, nausea, vomiting, diarrhea, change in bowel habits, loss of appetite, bloody stools.   Resp:.  No  chest wall deformity  Skin: no rash or lesions.  GU: no dysuria, change in color of urine, no urgency or frequency.  No flank pain, no hematuria   MS:  No joint pain or swelling.  No decreased range of motion.  No back pain.    Physical Exam  BP 118/82   Pulse 92   Ht 5\' 2"  (1.575 m)   Wt 255 lb (115.7 kg)   SpO2 98%   BMI 46.64 kg/m   GEN: A/Ox3; pleasant , NAD, elderly   HEENT:  /AT,  EACs-clear, TMs-wnl, NOSE-clear, THROAT-clear, no lesions, no postnasal drip or exudate noted.   NECK:  Supple w/ fair ROM; no JVD; normal carotid impulses w/o bruits; no thyromegaly or nodules palpated; no lymphadenopathy.    RESP decreased breath sounds in the bases  no accessory muscle use, no dullness to percussion  CARD:  RRR, no m/r/g, no peripheral edema, pulses intact, no cyanosis or clubbing.  GI:   Soft & nt; nml bowel sounds; no organomegaly or masses detected.   Musco: Warm bil, no deformities or joint swelling noted.   Neuro: alert, no focal deficits noted.    Skin: Warm, no lesions or rashes    Lab Results:  CBC  BNP  Imaging: No results found.    PFT Results Latest Ref Rng & Units 05/26/2017  FVC-Pre L 2.23  FVC-Predicted Pre % 68  FVC-Post L 2.38  FVC-Predicted Post % 72  Pre FEV1/FVC % % 63  Post FEV1/FCV % % 67  FEV1-Pre L 1.40  FEV1-Predicted Pre % 55  FEV1-Post L 1.60  DLCO UNC% % 55  DLCO COR %Predicted %  63  TLC L 5.32  TLC % Predicted % 105  RV % Predicted % 141    No results found for: NITRICOXIDE      Assessment & Plan:   COPD GOLD II still smoking  Compensated on present regimen  Plan  Patient Instructions  Continue on Symbicort 2 puffs Twice daily  , rinse after use.  Continue INCRUSE 1 puff daily . Rinse after use .  Continue on Oxygen 2l/m At bedtime   Work on not smoking ., Set up overnight oximetry test on room air . (Insurance requires)  Follow up in 4-6 months with Dr. Melvyn Novas  Or Aeva Posey NP  Please contact office for sooner follow up if symptoms do not improve or worsen or seek emergency care         Chronic respiratory failure with hypoxia ([place]) Compensated on oxygen  Overnight oximetry test on room air due to insurance requirements  Plan  Patient Instructions  Continue on Symbicort 2 puffs Twice daily  , rinse after use.  Continue INCRUSE 1 puff daily . Rinse after use .  Continue on Oxygen 2l/m At bedtime   Work on not smoking ., Set up overnight oximetry test on room air . (Insurance requires)  Follow up in 4-6 months with Dr. Melvyn Novas  Or Mikhai Bienvenue NP  Please contact office for sooner follow up if symptoms do not improve or worsen or seek emergency care            Rexene Edison, NP 04/27/2018

## 2018-04-27 NOTE — Assessment & Plan Note (Signed)
Compensated on oxygen  Overnight oximetry test on room air due to insurance requirements  Plan  Patient Instructions  Continue on Symbicort 2 puffs Twice daily  , rinse after use.  Continue INCRUSE 1 puff daily . Rinse after use .  Continue on Oxygen 2l/m At bedtime   Work on not smoking ., Set up overnight oximetry test on room air . (Insurance requires)  Follow up in 4-6 months with Dr. Melvyn Novas  Or Arvle Grabe NP  Please contact office for sooner follow up if symptoms do not improve or worsen or seek emergency care

## 2018-04-27 NOTE — Assessment & Plan Note (Signed)
Compensated on present regimen  Plan  Patient Instructions  Continue on Symbicort 2 puffs Twice daily  , rinse after use.  Continue INCRUSE 1 puff daily . Rinse after use .  Continue on Oxygen 2l/m At bedtime   Work on not smoking ., Set up overnight oximetry test on room air . (Insurance requires)  Follow up in 4-6 months with Dr. Melvyn Novas  Or Cherry Wittwer NP  Please contact office for sooner follow up if symptoms do not improve or worsen or seek emergency care

## 2018-04-27 NOTE — Telephone Encounter (Signed)
Xanax last rx 03/10/18 #90 1 RF CSC: 08/21/17 UDS: 12/11/17 LOV: 02/26/18  Please advise

## 2018-04-27 NOTE — Patient Instructions (Addendum)
Continue on Symbicort 2 puffs Twice daily  , rinse after use.  Continue INCRUSE 1 puff daily . Rinse after use .  Continue on Oxygen 2l/m At bedtime   Work on not smoking ., Set up overnight oximetry test on room air . (Insurance requires)  Follow up in 4-6 months with Dr. Melvyn Novas  Or [place] NP  Please contact office for sooner follow up if symptoms do not improve or worsen or seek emergency care

## 2018-04-29 NOTE — Progress Notes (Signed)
Chart and office note reviewed in detail  > agree with a/p as outlined    

## 2018-05-05 ENCOUNTER — Other Ambulatory Visit: Payer: Self-pay | Admitting: Physician Assistant

## 2018-05-05 ENCOUNTER — Telehealth: Payer: Self-pay | Admitting: Adult Health

## 2018-05-05 NOTE — Telephone Encounter (Signed)
Xanax last rx 03/10/18 #90 1 RF Norco last rx 03/31/18 # 90 LOV: 04/27/18 CSC: 08/21/17 UDS: 12/11/17  Please advise

## 2018-05-05 NOTE — Telephone Encounter (Signed)
Returned phone call to patient, requesting results of ONO done 04/30/2018. Informed patient typically we ask that patients expect a wait period of at least a week and a half. Voiced understanding.   Call made to [place], left message. Will await a call back.

## 2018-05-07 ENCOUNTER — Telehealth: Payer: Self-pay | Admitting: Adult Health

## 2018-05-07 NOTE — Telephone Encounter (Signed)
Received ONO on room air results from [place]. Per TP, ONO did show an decrease in O2. Patient can continue on 2L of O2 at bedtime.   ATC patient but she did not answer. VM was not setup. Will call back again later.

## 2018-05-11 ENCOUNTER — Ambulatory Visit: Payer: Managed Care, Other (non HMO)

## 2018-05-11 ENCOUNTER — Ambulatory Visit: Payer: Managed Care, Other (non HMO) | Admitting: Adult Health

## 2018-05-13 NOTE — Telephone Encounter (Signed)
Left message for patient to call back  

## 2018-05-14 NOTE — Telephone Encounter (Signed)
Patient is returning phone call.  Patient phone number is [phone].

## 2018-05-14 NOTE — Telephone Encounter (Signed)
Called and spoke with pt stating to her that TP said she could continue on 2L at bedtime due to no decrease in O2 from ONO. Pt expressed understanding. Nothing further needed.

## 2018-05-20 ENCOUNTER — Other Ambulatory Visit: Payer: Self-pay | Admitting: Adult Health

## 2018-05-20 ENCOUNTER — Other Ambulatory Visit: Payer: Self-pay | Admitting: Physician Assistant

## 2018-05-20 NOTE — Telephone Encounter (Signed)
Needs VV to follow-up for cholesterol as she is well overdue for this. Ok to refill her depression medications since. Give 30-days of cholesterol med. No further refill without follow-up.

## 2018-05-20 NOTE — Telephone Encounter (Signed)
Spoke with patient to schedule a follow up appointment. She is taking Sertraline for her mood. She couldn't tolerate the Wellbutrin. Patient states she is stable on current medications. She is doing better since her adult kids have moved out the house

## 2018-05-20 NOTE — Telephone Encounter (Signed)
Patient due for follow up for Anxiety/Depression. Will call and schedule.

## 2018-05-20 NOTE — Telephone Encounter (Signed)
Patient is due for cholesterol check.

## 2018-05-25 ENCOUNTER — Other Ambulatory Visit: Payer: Self-pay

## 2018-05-25 ENCOUNTER — Encounter: Payer: Self-pay | Admitting: Physician Assistant

## 2018-05-25 ENCOUNTER — Ambulatory Visit (INDEPENDENT_AMBULATORY_CARE_PROVIDER_SITE_OTHER): Payer: Managed Care, Other (non HMO) | Admitting: Physician Assistant

## 2018-05-25 DIAGNOSIS — F419 Anxiety disorder, unspecified: Secondary | ICD-10-CM

## 2018-05-25 DIAGNOSIS — G894 Chronic pain syndrome: Secondary | ICD-10-CM | POA: Diagnosis not present

## 2018-05-25 DIAGNOSIS — F329 Major depressive disorder, single episode, unspecified: Secondary | ICD-10-CM | POA: Diagnosis not present

## 2018-05-25 DIAGNOSIS — E785 Hyperlipidemia, unspecified: Secondary | ICD-10-CM

## 2018-05-25 DIAGNOSIS — I10 Essential (primary) hypertension: Secondary | ICD-10-CM | POA: Diagnosis not present

## 2018-05-25 NOTE — Progress Notes (Signed)
I have discussed the procedure for the virtual visit with the patient who has given consent to proceed with assessment and treatment.   Chaye Misch S Meryem Haertel, CMA     

## 2018-05-25 NOTE — Progress Notes (Signed)
Virtual Visit via Video   I connected with patient on 05/25/18 at  1:00 PM EDT by a video enabled telemedicine application and verified that I am speaking with the correct person using two identifiers.  Location patient: Home Location provider: [facility], Office Persons participating in the virtual visit: Patient, Provider, [place] (Patina Moore)  I discussed the limitations of evaluation and management by telemedicine and the availability of in person appointments. The patient expressed understanding and agreed to proceed.  Subjective:   HPI:  Patient presents today via [facility] for follow-up for hypertension and hyperlipidemia. Patient is currently on a regimen of Atorvastatin 20 mg, Maxzide 37.5-25 mg daily. Patient endorses taking medications as directed. Tolerating well. Patient denies chest pain, palpitations, lightheadedness, dizziness, vision changes or frequent headaches. Endorses eating more currently due to being at home but is trying to watch the types of foods she is eating.  BP Readings from Last 3 Encounters:  04/27/18 118/82  02/26/18 120/80  01/19/18 122/80   Patient also following up regarding her anxiety. Is currently on a regimen of Sertraline daily which she endorses taking as directed. Notes doing very well overall on this regimen. Notes doing much better since her daughter moved out. Endorses no trouble sleeping. Denies depressed mood or anhedonia. Denies SI/HI.  Patient also due for pain medication management:  Indication for chronic opioid: OA of multiple sites, DDD lumbar spine Medication and dose: Hydrocodone 10-325 mg # pills per month: 90 Last UDS date: 12/11/17 Pain contract signed (date): 12/11/17 Date narcotic database last reviewed (include red flags): 05/25/2018  Pain Inventory (1-10 worse): Average Pain 7-8 Pain Right Now 7 My pain is dull, constant (character i.e. sharp, stabbing, dull, constant etc)  Pain is worse with: prolonged  standing and ambulation Relief from Meds: decent  In the last 24 hours, has pain interfered with the following (1-10 greatest interference) ? General activity 6 Relation with others 1 Enjoyment of life 3 What TIME of day is your pain at its worst? evening       Sleep (in general) good  Mobility/Function: Assistance device: cane How many minutes can you walk? 10 Ability to climb steps?  limited Do you drive? no  Neuro/Psych Sx: (bladder, bowel, weakness, dizziness, depression etc) Depression -- managed as noted above. Prior Studies: See EMR Physicians involved in your care: Any changes since last visit?  No changes   ROS:   See pertinent positives and negatives per HPI.  Patient Active Problem List   Diagnosis Date Noted  . Chronic respiratory failure with hypoxia ([place]) 01/12/2018  . GERD with esophagitis 11/26/2017  . Dysphagia 08/20/2017  . Chronic pain syndrome 08/04/2017  . Comorbid sleep-related hypoventilation 08/04/2017  . OSA (obstructive sleep apnea) 08/04/2017  . Hepatic steatosis 08/04/2017  . Polyp of ascending colon   . Diverticulosis of colon without hemorrhage   . Gastritis and gastroduodenitis   . Hypothyroidism 07/22/2017  . Anxiety and depression 07/22/2017  . Iron deficiency anemia 07/22/2017  . Pilar cyst 07/22/2017  . Morbid obesity due to excess calories ([place]) 03/19/2017  . Anemia 03/19/2017  . COPD GOLD II still smoking  03/18/2017  . Degenerative lumbar spinal stenosis 02/17/2017  . S/P right hip revision 09/02/2016  . S/P hip replacement 09/02/2016  . Instability of prosthetic hip ([place]) 03/01/2016  . Dislocation of hip prosthesis ([place]) 02/17/2016  . Abnormal nuclear stress test: Intermediate Risk 10/31/2015  . Atypical angina ([place]) 10/31/2015  . Trigger finger, acquired 08/20/2012  .  Cigarette smoker 12/12/2008  . Hyperlipidemia 11/17/2008  . HYPERTENSION, BENIGN 11/16/2008  . GEN OSTEOARTHROSIS INVOLVING MULTIPLE SITES 11/16/2008     Social History   Tobacco Use  . Smoking status: Current Every Day Smoker    Packs/day: 1.50    Years: 46.00    Pack years: 69.00    Types: Cigarettes  . Smokeless tobacco: Never Used  Substance Use Topics  . Alcohol use: No    Current Outpatient Medications:  .  albuterol (PROAIR HFA) 108 (90 Base) MCG/ACT inhaler, 2 puffs every 4 hours as needed only  if your can't catch your breath (Patient taking differently: Inhale 2 puffs into the lungs every 4 (four) hours as needed for wheezing or shortness of breath. ), Disp: 1 Inhaler, Rfl: 1 .  ALPRAZolam (XANAX) 1 MG tablet, TAKE 1 TABLET BY MOUTH THREE TIMES DAILY AS NEEDED FOR ANXIETY, Disp: 90 tablet, Rfl: 0 .  atorvastatin (LIPITOR) 20 MG tablet, TAKE 1 TABLET BY MOUTH AT BEDTIME, Disp: 30 tablet, Rfl: 0 .  famotidine (PEPCID) 20 MG tablet, Take 1 tablet (20 mg total) by mouth 2 (two) times daily., Disp: 60 tablet, Rfl: 11 .  fluticasone (FLONASE) 50 MCG/ACT nasal spray, Place 2 sprays into both nostrils daily., Disp: , Rfl:  .  gabapentin (NEURONTIN) 300 MG capsule, Take 600 mg by mouth 3 times daily, Disp: , Rfl:  .  HYDROcodone-acetaminophen (NORCO) 10-325 MG tablet, TAKE 1 TABLET BY MOUTH THREE TIMES DAILY, Disp: 90 tablet, Rfl: 0 .  INCRUSE ELLIPTA 62.5 MCG/INH AEPB, INHALE 1 PUFF INTO THE LUNGS DAILY, Disp: 30 each, Rfl: 5 .  levothyroxine (SYNTHROID) 75 MCG tablet, TAKE 1 TABLET BY MOUTH DAILY BEFORE BREAKFAST, Disp: 90 tablet, Rfl: 1 .  loratadine (CLARITIN) 10 MG tablet, Take 10 mg by mouth daily., Disp: , Rfl:  .  omeprazole (PRILOSEC) 40 MG capsule, TAKE 1 CAPSULE BY MOUTH BEFORE BREAKFAST AND TAKE 1 CAPSULE BY MOUTH BEFORE DINNER, Disp: 60 capsule, Rfl: 3 .  OXYGEN, Inhale 2 L into the lungs at bedtime. , Disp: , Rfl:  .  potassium chloride (K-DUR,KLOR-CON) 10 MEQ tablet, TAKE 1 TABLET BY MOUTH AT BEDTIME, Disp: 90 tablet, Rfl: 1 .  sertraline (ZOLOFT) 100 MG tablet, TAKE TWO TABLETS BY MOUTH AT BEDTIME, Disp: 180 tablet, Rfl:  1 .  SYMBICORT 160-4.5 MCG/ACT inhaler, INHALE TWO PUFFS BY MOUTH TWICE DAILY, Disp: 10.2 g, Rfl: 3 .  triamterene-hydrochlorothiazide (MAXZIDE-25) 37.5-25 MG tablet, TAKE 1 TABLET BY MOUTH EVERY DAY, Disp: 90 tablet, Rfl: 1  Allergies  Allergen Reactions  . Nsaids Diarrhea  . Aleve [Naproxen Sodium] Other (See Comments)    Headache   . Codeine Nausea Only and Other (See Comments)    GI upset  . Penicillins Nausea Only and Other (See Comments)    GI upset Has patient had a PCN reaction causing immediate rash, facial/tongue/throat swelling, SOB or lightheadedness with hypotension: No Has patient had a PCN reaction causing severe rash involving mucus membranes or skin necrosis: No Has patient had a PCN reaction that required hospitalization No Has patient had a PCN reaction occurring within the last 10 years: No If all of the above answers are "NO", then may proceed with Cephalosporin use.   . Sulfonamide Derivatives Hives    Objective:   There were no vitals taken for this visit.  Patient is well-developed, well-nourished in no acute distress.  Resting comfortably at home.  Head is normocephalic, atraumatic.  No labored breathing.  Speech is  clear and coherent with logical contest.  Patient is alert and oriented at baseline.   Assessment and Plan:   1. HYPERTENSION, BENIGN BP previously well-controlled on this regimen. She is unable to check BP at home currently but will do so when able and give [place] readings. Will continue current regimen. Will have her follow-up in a month or so for lab-only appointment to recheck BMP.  2. Hyperlipidemia, unspecified hyperlipidemia type Taking medications as directed. Continue statin. Dietary and exercise recommendations reviewed. Will have fasting lipids repeated at lab appt.  3. Anxiety and depression Doing very well. Continue current regimen.  4. Chronic pain syndrome CSC up-to-date. UDS up-to-date. PDMP review performed and no red  flags. Medications refilled to pharmacy to hold until due next week.      [name], PA-C 05/25/2018

## 2018-05-29 ENCOUNTER — Other Ambulatory Visit: Payer: Self-pay | Admitting: Physician Assistant

## 2018-06-01 NOTE — Telephone Encounter (Signed)
Xanax last rx 05/05/18 #90 CSC: 08/18/17 UDS: 12/11/17  LOV: 05/25/18 Virtual Visit

## 2018-06-18 ENCOUNTER — Other Ambulatory Visit: Payer: Self-pay | Admitting: Physician Assistant

## 2018-06-18 ENCOUNTER — Other Ambulatory Visit: Payer: Self-pay | Admitting: Internal Medicine

## 2018-06-30 ENCOUNTER — Other Ambulatory Visit: Payer: Self-pay | Admitting: Physician Assistant

## 2018-06-30 NOTE — Telephone Encounter (Signed)
Xanax 06/01/18 #90 Vicodin 06/01/18 #90 LOV: 05/25/18 CSC: 08/21/17

## 2018-07-20 ENCOUNTER — Other Ambulatory Visit: Payer: Self-pay | Admitting: Gastroenterology

## 2018-07-20 ENCOUNTER — Other Ambulatory Visit: Payer: Self-pay | Admitting: Physician Assistant

## 2018-07-21 NOTE — Telephone Encounter (Signed)
Last refill: 06/30/18 #90,0  Last OV:05/25/18

## 2018-07-28 ENCOUNTER — Other Ambulatory Visit: Payer: Self-pay | Admitting: Physician Assistant

## 2018-07-28 NOTE — Telephone Encounter (Signed)
Indication for chronic opioid: OA, Chronic pain syndrome Medication and dose: Norco 10/325 mg # pills per month: 90 06/30/18 Last UDS date: 12/11/17 Opioid Treatment Agreement signed (Y/N): Yes 12/11/17 Opioid Treatment Agreement last reviewed with patient:  [facility] reviewed this encounter (include red flags):   Xanax last rx 06/30/18 #90 LOV: 05/25/18 for HTN, Pain, Anxiety

## 2018-08-03 ENCOUNTER — Ambulatory Visit: Payer: Managed Care, Other (non HMO)

## 2018-08-03 ENCOUNTER — Ambulatory Visit (INDEPENDENT_AMBULATORY_CARE_PROVIDER_SITE_OTHER): Payer: Managed Care, Other (non HMO) | Admitting: Adult Health

## 2018-08-03 ENCOUNTER — Other Ambulatory Visit: Payer: Self-pay

## 2018-08-03 ENCOUNTER — Encounter: Payer: Self-pay | Admitting: Adult Health

## 2018-08-03 DIAGNOSIS — J9611 Chronic respiratory failure with hypoxia: Secondary | ICD-10-CM | POA: Diagnosis not present

## 2018-08-03 DIAGNOSIS — J449 Chronic obstructive pulmonary disease, unspecified: Secondary | ICD-10-CM

## 2018-08-03 NOTE — Patient Instructions (Signed)
Continue on Symbicort 2 puffs Twice daily  , rinse after use.  Continue INCRUSE 1 puff daily . Rinse after use .  Continue on Oxygen 2l/m At bedtime   Work on not smoking ., Follow up in 4-6 months with Dr. Melvyn Novas  Or Lenward Able NP  Please contact office for sooner follow up if symptoms do not improve or worsen or seek emergency care

## 2018-08-03 NOTE — Assessment & Plan Note (Signed)
Cont on O2 At bedtime   

## 2018-08-03 NOTE — Progress Notes (Signed)
@Patient  ID: [name], female    DOB: 07-31-1955, 63 y.o.   MRN: [id number]  Chief Complaint  Patient presents with   Follow-up    COPD     Referring provider: Delorse Limber  HPI: 63 year old female smoker seen for initial pulmonary consult February 2019 found to have gold 2 COPD with reversibilityandoxygen dependent respiratory failure on nocturnal oxygen at 2 L Is disabled from orthopedic issues.-severe hip issues  Medical hx of Dyslipidemia , HTN , anemia  TEST/EVENTS :  Spirometry 2/19/2019FEV1 1.54 (66%) Ratio 75 with min curvature p am symb - Allergy profile 03/18/17 >Eos 0.3 / IgE 4 neg RAST  - PFT's 4/29/2019FEV1 1.60 (63 % ) ratio 67 p 14 % improvement from saba p sym/ spriva prior to study with DLCO 55 % corrects to 63 % for alv volume with EOS up to 0.8 HRCT chest 12/24/2017-no ILD, mild to moderate emphysema Alpha-1 December 05, 2017 normal, MM, 174  08/03/2018 Follow up : COPD , O2 RF  Returns for a 59-month follow-up.  Patient has underlying moderate COPD.  She is on Symbicort and Incruse Since last visit patient says her breathing is doing about the same.  She gets winded with heavy activity.  She denies any flare of cough or wheezing.  Patient is still smoking.  She has been under a lot of stress.  As her daughter recently passed away..  Autopsy is pending .  Support was provided.  Discussed smoking cessation.  Patient is on oxygen 2 L at bedtime.     Allergies  Allergen Reactions   Nsaids Diarrhea   Aleve [Naproxen Sodium] Other (See Comments)    Headache    Codeine Nausea Only and Other (See Comments)    GI upset   Penicillins Nausea Only and Other (See Comments)    GI upset Has patient had a PCN reaction causing immediate rash, facial/tongue/throat swelling, SOB or lightheadedness with hypotension: No Has patient had a PCN reaction causing severe rash involving mucus membranes or skin necrosis: No Has patient had a PCN  reaction that required hospitalization No Has patient had a PCN reaction occurring within the last 10 years: No If all of the above answers are "NO", then may proceed with Cephalosporin use.    Sulfonamide Derivatives Hives    Immunization History  Administered Date(s) Administered   Influenza-Unspecified 01/16/2011, 11/26/2011, 12/22/2012, 11/17/2013    Past Medical History:  Diagnosis Date   Allergic rhinitis    Anemia    Anxiety    Chicken pox    Chronic back pain    COPD (chronic obstructive pulmonary disease) (HCC)    Depression    Essential hypertension    GERD (gastroesophageal reflux disease)    Headache    migraines   History of gastritis    EGD 2015   History of home oxygen therapy    2 liters at hs last 6 months   Hyperlipidemia    Hypothyroidism    Migraines    Osteoarthritis    oa   Scoliosis     Tobacco History: Social History   Tobacco Use  Smoking Status Current Every Day Smoker   Packs/day: 1.50   Years: 46.00   Pack years: 69.00   Types: Cigarettes  Smokeless Tobacco Never Used   Ready to quit: No Counseling given: Yes   Outpatient Medications Prior to Visit  Medication Sig Dispense Refill   albuterol (PROAIR HFA) 108 (90 Base) MCG/ACT inhaler 2 puffs  every 4 hours as needed only  if your can't catch your breath (Patient taking differently: Inhale 2 puffs into the lungs every 4 (four) hours as needed for wheezing or shortness of breath. ) 1 Inhaler 1   ALPRAZolam (XANAX) 1 MG tablet TAKE 1 TABLET BY MOUTH THREE TIMES DAILY AS NEEDED FOR ANXIETY 90 tablet 0   atorvastatin (LIPITOR) 20 MG tablet TAKE 1 TABLET BY MOUTH AT BEDTIME 30 tablet 0   famotidine (PEPCID) 20 MG tablet Take 1 tablet (20 mg total) by mouth 2 (two) times daily. 60 tablet 11   fluticasone (FLONASE) 50 MCG/ACT nasal spray Place 2 sprays into both nostrils daily.     gabapentin (NEURONTIN) 300 MG capsule Take 600 mg by mouth 3 times daily      HYDROcodone-acetaminophen (NORCO) 10-325 MG tablet TAKE 1 TABLET BY MOUTH THREE TIMES DAILY 90 tablet 0   INCRUSE ELLIPTA 62.5 MCG/INH AEPB INHALE 1 PUFF INTO THE LUNGS DAILY 30 each 5   levothyroxine (SYNTHROID) 75 MCG tablet TAKE 1 TABLET BY MOUTH DAILY BEFORE BREAKFAST 90 tablet 1   loratadine (CLARITIN) 10 MG tablet Take 10 mg by mouth daily.     omeprazole (PRILOSEC) 40 MG capsule TAKE 1 CAPSULE BY MOUTH BEFORE BREAKFAST AND TAKE 1 CAPSULE BY MOUTH BEFORE DINNER 60 capsule 3   OXYGEN Inhale 2 L into the lungs at bedtime.      potassium chloride (K-DUR) 10 MEQ tablet TAKE 1 TABLET BY MOUTH AT BEDTIME 90 tablet 1   sertraline (ZOLOFT) 100 MG tablet TAKE TWO TABLETS BY MOUTH AT BEDTIME 180 tablet 1   SYMBICORT 160-4.5 MCG/ACT inhaler INHALE TWO PUFFS BY MOUTH TWICE DAILY 10.2 g 3   triamterene-hydrochlorothiazide (MAXZIDE-25) 37.5-25 MG tablet TAKE 1 TABLET BY MOUTH EVERY DAY 90 tablet 1   No facility-administered medications prior to visit.      Review of Systems:   Constitutional:   No  weight loss, night sweats,  Fevers, chills,  +fatigue, or  lassitude.  HEENT:   No headaches,  Difficulty swallowing,  Tooth/dental problems, or  Sore throat,                No sneezing, itching, ear ache, nasal congestion, post nasal drip,   CV:  No chest pain,  Orthopnea, PND, swelling in lower extremities, anasarca, dizziness, palpitations, syncope.   GI  No heartburn, indigestion, abdominal pain, nausea, vomiting, diarrhea, change in bowel habits, loss of appetite, bloody stools.   Resp:  .  No excess mucus, no productive cough,  No non-productive cough,  No coughing up of blood.  No change in color of mucus.  No wheezing.  No chest wall deformity  Skin: no rash or lesions.  GU: no dysuria, change in color of urine, no urgency or frequency.  No flank pain, no hematuria   MS:  No joint pain or swelling.  No decreased range of motion.  No back pain.    Physical Exam  BP 112/78 (BP  Location: Left Arm, Cuff Size: Normal)    Pulse 90    Temp 98.1 F (36.7 C) (Oral)    Ht 5\' 2"  (1.575 m)    Wt 262 lb 3.2 oz (118.9 kg)    SpO2 96%    BMI 47.96 kg/m   GEN: A/Ox3; pleasant , NAD, chronically ill appearing , obese , wc    HEENT:  [place]/AT,    NOSE-clear, THROAT-clear, no lesions, no postnasal drip or exudate noted.   NECK:  Supple w/ fair ROM; no JVD; normal carotid impulses w/o bruits; no thyromegaly or nodules palpated; no lymphadenopathy.    RESP  Decreased BS in bases no accessory muscle use, no dullness to percussion  CARD:  RRR, no m/r/g, no peripheral edema, pulses intact, no cyanosis or clubbing.  GI:   Soft & nt; nml bowel sounds; no organomegaly or masses detected.   Musco: Warm bil, no deformities or joint swelling noted.   Neuro: alert, no focal deficits noted.    Skin: Warm, no lesions or rashes    Lab Results:  CBC  BMET  BNP  Imaging: No results found.    PFT Results Latest Ref Rng & Units 05/26/2017  FVC-Pre L 2.23  FVC-Predicted Pre % 68  FVC-Post L 2.38  FVC-Predicted Post % 72  Pre FEV1/FVC % % 63  Post FEV1/FCV % % 67  FEV1-Pre L 1.40  FEV1-Predicted Pre % 55  FEV1-Post L 1.60  DLCO UNC% % 55  DLCO COR %Predicted % 63  TLC L 5.32  TLC % Predicted % 105  RV % Predicted % 141    No results found for: NITRICOXIDE      Assessment & Plan:   Chronic respiratory failure with hypoxia (HCC) Cont on O2 At bedtime.    COPD GOLD II still smoking  Stable , encouraged on smoking cessation   Plan  Patient Instructions  Continue on Symbicort 2 puffs Twice daily  , rinse after use.  Continue INCRUSE 1 puff daily . Rinse after use .  Continue on Oxygen 2l/m At bedtime   Work on not smoking ., Follow up in 4-6 months with Dr. Melvyn Novas  Or Lorry Furber NP  Please contact office for sooner follow up if symptoms do not improve or worsen or seek emergency care            Rexene Edison, NP 08/03/2018

## 2018-08-03 NOTE — Assessment & Plan Note (Signed)
Stable , encouraged on smoking cessation   Plan  Patient Instructions  Continue on Symbicort 2 puffs Twice daily  , rinse after use.  Continue INCRUSE 1 puff daily . Rinse after use .  Continue on Oxygen 2l/m At bedtime   Work on not smoking ., Follow up in 4-6 months with Dr. Melvyn Novas  Or Murdis Flitton NP  Please contact office for sooner follow up if symptoms do not improve or worsen or seek emergency care

## 2018-08-10 DIAGNOSIS — M25562 Pain in left knee: Secondary | ICD-10-CM | POA: Diagnosis not present

## 2018-08-10 DIAGNOSIS — M48061 Spinal stenosis, lumbar region without neurogenic claudication: Secondary | ICD-10-CM | POA: Diagnosis not present

## 2018-08-10 NOTE — Progress Notes (Signed)
Chart and office note reviewed in detail  > agree with a/p as outlined    

## 2018-08-17 ENCOUNTER — Other Ambulatory Visit: Payer: Self-pay | Admitting: Physician Assistant

## 2018-08-17 DIAGNOSIS — E785 Hyperlipidemia, unspecified: Secondary | ICD-10-CM

## 2018-08-17 NOTE — Telephone Encounter (Signed)
Xanax last rx 07/28/18 #90 CSC: 08/21/17 UDS: 12/11/17 LOV: 05/25/18

## 2018-08-19 ENCOUNTER — Encounter: Payer: Self-pay | Admitting: Physician Assistant

## 2018-08-19 ENCOUNTER — Other Ambulatory Visit: Payer: Self-pay

## 2018-08-19 ENCOUNTER — Ambulatory Visit (INDEPENDENT_AMBULATORY_CARE_PROVIDER_SITE_OTHER): Payer: Managed Care, Other (non HMO) | Admitting: Physician Assistant

## 2018-08-19 DIAGNOSIS — M6281 Muscle weakness (generalized): Secondary | ICD-10-CM

## 2018-08-19 NOTE — Progress Notes (Signed)
Virtual Visit via Video   I connected with patient on 08/19/18 at  3:30 PM EDT by a video enabled telemedicine application and verified that I am speaking with the correct person using two identifiers.  Location patient: Home Location provider: [facility], Office Persons participating in the virtual visit: Patient, Provider, [place] (Patina Moore)  I discussed the limitations of evaluation and management by telemedicine and the availability of in person appointments. The patient expressed understanding and agreed to proceed.  Subjective:   HPI:   Patient presents via [facility] today c/o weakness of extremities. Patient with history of spinal stenosis, followed by Neurosurgery. Notes over the past few months feeling this gradual worsening of weakness that she noted of upper and lower extremities bilaterally. She notes weakness seems to be more distal and sometimes associated with tremor. Denies change to her vision, memory, speech or coordination. Some decline in mood recently due to the unexpected death of her daughter (thought to be drug overdose -- autopsy pending), but feels she is handling well giving the dire circumstances. .   ROS:   See pertinent positives and negatives per HPI.  Patient Active Problem List   Diagnosis Date Noted  . Chronic respiratory failure with hypoxia ([place]) 01/12/2018  . GERD with esophagitis 11/26/2017  . Dysphagia 08/20/2017  . Chronic pain syndrome 08/04/2017  . Comorbid sleep-related hypoventilation 08/04/2017  . OSA (obstructive sleep apnea) 08/04/2017  . Hepatic steatosis 08/04/2017  . Polyp of ascending colon   . Diverticulosis of colon without hemorrhage   . Gastritis and gastroduodenitis   . Hypothyroidism 07/22/2017  . Anxiety and depression 07/22/2017  . Iron deficiency anemia 07/22/2017  . Pilar cyst 07/22/2017  . Morbid obesity due to excess calories ([place]) 03/19/2017  . Anemia 03/19/2017  . COPD GOLD II still smoking  03/18/2017   . Degenerative lumbar spinal stenosis 02/17/2017  . S/P right hip revision 09/02/2016  . S/P hip replacement 09/02/2016  . Instability of prosthetic hip ([place]) 03/01/2016  . Dislocation of hip prosthesis ([place]) 02/17/2016  . Abnormal nuclear stress test: Intermediate Risk 10/31/2015  . Atypical angina ([place]) 10/31/2015  . Trigger finger, acquired 08/20/2012  . Cigarette smoker 12/12/2008  . Hyperlipidemia 11/17/2008  . HYPERTENSION, BENIGN 11/16/2008  . GEN OSTEOARTHROSIS INVOLVING MULTIPLE SITES 11/16/2008    Social History   Tobacco Use  . Smoking status: Current Every Day Smoker    Packs/day: 1.50    Years: 46.00    Pack years: 69.00    Types: Cigarettes  . Smokeless tobacco: Never Used  Substance Use Topics  . Alcohol use: No    Current Outpatient Medications:  .  albuterol (PROAIR HFA) 108 (90 Base) MCG/ACT inhaler, 2 puffs every 4 hours as needed only  if your can't catch your breath (Patient taking differently: Inhale 2 puffs into the lungs every 4 (four) hours as needed for wheezing or shortness of breath. ), Disp: 1 Inhaler, Rfl: 1 .  ALPRAZolam (XANAX) 1 MG tablet, TAKE 1 TABLET BY MOUTH THREE TIMES DAILY AS NEEDED FOR ANXIETY, Disp: 90 tablet, Rfl: 0 .  atorvastatin (LIPITOR) 20 MG tablet, TAKE 1 TABLET BY MOUTH AT BEDTIME, Disp: 90 tablet, Rfl: 1 .  famotidine (PEPCID) 20 MG tablet, Take 1 tablet (20 mg total) by mouth 2 (two) times daily., Disp: 60 tablet, Rfl: 11 .  fluticasone (FLONASE) 50 MCG/ACT nasal spray, Place 2 sprays into both nostrils daily., Disp: , Rfl:  .  gabapentin (NEURONTIN) 300 MG capsule, Take 600  mg by mouth 3 times daily, Disp: , Rfl:  .  HYDROcodone-acetaminophen (NORCO) 10-325 MG tablet, TAKE 1 TABLET BY MOUTH THREE TIMES DAILY, Disp: 90 tablet, Rfl: 0 .  INCRUSE ELLIPTA 62.5 MCG/INH AEPB, INHALE 1 PUFF INTO THE LUNGS DAILY, Disp: 30 each, Rfl: 5 .  levothyroxine (SYNTHROID) 75 MCG tablet, TAKE 1 TABLET BY MOUTH DAILY BEFORE BREAKFAST, Disp: 90  tablet, Rfl: 1 .  loratadine (CLARITIN) 10 MG tablet, Take 10 mg by mouth daily., Disp: , Rfl:  .  omeprazole (PRILOSEC) 40 MG capsule, TAKE 1 CAPSULE BY MOUTH BEFORE BREAKFAST AND TAKE 1 CAPSULE BY MOUTH BEFORE DINNER, Disp: 60 capsule, Rfl: 3 .  OXYGEN, Inhale 2 L into the lungs at bedtime. , Disp: , Rfl:  .  potassium chloride (K-DUR) 10 MEQ tablet, TAKE 1 TABLET BY MOUTH AT BEDTIME, Disp: 90 tablet, Rfl: 1 .  sertraline (ZOLOFT) 100 MG tablet, TAKE TWO TABLETS BY MOUTH AT BEDTIME, Disp: 180 tablet, Rfl: 1 .  SYMBICORT 160-4.5 MCG/ACT inhaler, INHALE TWO PUFFS BY MOUTH TWICE DAILY, Disp: 10.2 g, Rfl: 3 .  triamterene-hydrochlorothiazide (MAXZIDE-25) 37.5-25 MG tablet, TAKE 1 TABLET BY MOUTH EVERY DAY, Disp: 90 tablet, Rfl: 1  Allergies  Allergen Reactions  . Nsaids Diarrhea  . Aleve [Naproxen Sodium] Other (See Comments)    Headache   . Codeine Nausea Only and Other (See Comments)    GI upset  . Penicillins Nausea Only and Other (See Comments)    GI upset Has patient had a PCN reaction causing immediate rash, facial/tongue/throat swelling, SOB or lightheadedness with hypotension: No Has patient had a PCN reaction causing severe rash involving mucus membranes or skin necrosis: No Has patient had a PCN reaction that required hospitalization No Has patient had a PCN reaction occurring within the last 10 years: No If all of the above answers are "NO", then may proceed with Cephalosporin use.   . Sulfonamide Derivatives Hives    Objective:   There were no vitals taken for this visit.  Patient is well-developed, well-nourished in no acute distress.  Resting comfortably at home.  Head is normocephalic, atraumatic.  No labored breathing.  Speech is clear and coherent with logical contest.  Patient is alert and oriented at baseline.   Assessment and Plan:   1. Distal muscle weakness Bilateral upper and lower extremities with some tremor noted on intent. Has been going on for a few  months but worsened over the past 6 weeks. Unsure if this is just a deterioration in symptoms or if correlated with significant increase in stress due to the unexpected loss of her daughter. Needs further assessment. Referral to Neurology placed so that she can get the appropriate workup for this. - Ambulatory referral to Neurology    [name], PA-C 08/19/2018

## 2018-08-26 ENCOUNTER — Other Ambulatory Visit: Payer: Self-pay | Admitting: Physician Assistant

## 2018-08-26 NOTE — Telephone Encounter (Signed)
Indication for chronic opioid: Chronic pain Medication and dose: Hydrocodone APAP 10/325 mg # pills per month: 90 on 07/28/18 Last UDS date: 12/11/17 Opioid Treatment Agreement signed (Y/N): Yes 08/21/17 Opioid Treatment Agreement last reviewed with patient:   [facility] reviewed this encounter (include red flags):

## 2018-08-28 ENCOUNTER — Telehealth: Payer: Self-pay | Admitting: Physician Assistant

## 2018-08-28 ENCOUNTER — Other Ambulatory Visit: Payer: Self-pay | Admitting: Physician Assistant

## 2018-08-28 NOTE — Telephone Encounter (Signed)
Spoke with patient about medications. She confused the Atorvastatin at bedtime with the Alprazolam.  Advised patient that her Xanax was just sent in today. It was denied on 08/17/18 refilled too early.  She wanted refill of the Hydrocodone. Advised patient she is overdue for a pain follow up. Scheduled for Monday 08/31/18. She did see Ortho-Dr Rushie Nyhan for her back and knee. She is using a walker to help when her leg gives out.  She is scheduled with Neurology on 09/28/18

## 2018-08-28 NOTE — Telephone Encounter (Signed)
Pt called in stating that she picked up her xanax script today and it was only 30 tabs.  The directions were to take 1 tab per day.  She states that she usually take 3 tabs per day. Please advise and make pt aware if this has been changed

## 2018-08-31 ENCOUNTER — Ambulatory Visit (INDEPENDENT_AMBULATORY_CARE_PROVIDER_SITE_OTHER): Payer: Managed Care, Other (non HMO) | Admitting: Physician Assistant

## 2018-08-31 ENCOUNTER — Other Ambulatory Visit: Payer: Self-pay

## 2018-08-31 ENCOUNTER — Encounter: Payer: Self-pay | Admitting: Physician Assistant

## 2018-08-31 ENCOUNTER — Telehealth: Payer: Self-pay

## 2018-08-31 VITALS — BP 110/70 | HR 93 | Temp 98.4°F | Resp 16 | Ht 62.0 in | Wt 260.0 lb

## 2018-08-31 DIAGNOSIS — G8929 Other chronic pain: Secondary | ICD-10-CM | POA: Diagnosis not present

## 2018-08-31 DIAGNOSIS — I82811 Embolism and thrombosis of superficial veins of right lower extremities: Secondary | ICD-10-CM

## 2018-08-31 DIAGNOSIS — E785 Hyperlipidemia, unspecified: Secondary | ICD-10-CM | POA: Diagnosis not present

## 2018-08-31 DIAGNOSIS — D239 Other benign neoplasm of skin, unspecified: Secondary | ICD-10-CM | POA: Diagnosis not present

## 2018-08-31 LAB — COMPREHENSIVE METABOLIC PANEL
ALT: 42 U/L — ABNORMAL HIGH (ref 0–35)
AST: 50 U/L — ABNORMAL HIGH (ref 0–37)
Albumin: 3.7 g/dL (ref 3.5–5.2)
Alkaline Phosphatase: 74 U/L (ref 39–117)
BUN: 11 mg/dL (ref 6–23)
CO2: 25 mEq/L (ref 19–32)
Calcium: 9.2 mg/dL (ref 8.4–10.5)
Chloride: 102 mEq/L (ref 96–112)
Creatinine, Ser: 0.89 mg/dL (ref 0.40–1.20)
GFR: 64.14 mL/min (ref >60.00)
Glucose, Bld: 85 mg/dL (ref 70–99)
Potassium: 4.2 mEq/L (ref 3.5–5.1)
Sodium: 139 mEq/L (ref 135–145)
Total Bilirubin: 0.4 mg/dL (ref 0.2–1.2)
Total Protein: 6.8 g/dL (ref 6.0–8.3)

## 2018-08-31 LAB — LIPID PANEL
Cholesterol: 132 mg/dL (ref 0–200)
HDL: 32.7 mg/dL — ABNORMAL LOW (ref >39.00)
NonHDL: 99.32
Total CHOL/HDL Ratio: 4
Triglycerides: 228 mg/dL — ABNORMAL HIGH (ref 0.0–149.0)
VLDL: 45.6 mg/dL — ABNORMAL HIGH (ref 0.0–40.0)

## 2018-08-31 LAB — LDL CHOLESTEROL, DIRECT: Direct LDL: 72 mg/dL

## 2018-08-31 MED ORDER — NYSTATIN 100000 UNIT/GM EX POWD: Freq: Two times a day (BID) | CUTANEOUS | 0 refills | Status: DC

## 2018-08-31 MED ORDER — OXYCODONE HCL ER 10 MG PO T12A: 10.0000 mg | EXTENDED_RELEASE_TABLET | Freq: Two times a day (BID) | ORAL | 0 refills | Status: DC

## 2018-08-31 MED ORDER — OXYCODONE ER 9 MG PO C12A: 1.0000 | EXTENDED_RELEASE_CAPSULE | Freq: Two times a day (BID) | ORAL | 0 refills | Status: DC | PRN

## 2018-08-31 NOTE — Telephone Encounter (Signed)
I resent in the medication with changes so she will need to check again with her pharmacy about the new prescription.  The powder was a prescription Nystatin powder so pharmacy should have for her.

## 2018-08-31 NOTE — Telephone Encounter (Signed)
New medication sent in was also denied by insurance. LMOVM to inform patient that PCP is aware of denial. PCP is working on prior [place] now

## 2018-08-31 NOTE — Patient Instructions (Signed)
Please start the new dose of medication, taking as directed for pain. Continue other medications as directed.  Please apply the powder to the area on your abdomen to help keep clean and dry. The areas here look like they are trying to heal but the moisture, etc is making that hard.  You will be contacted for an US of the leg. Please follow-up with Neurology as scheduled.

## 2018-08-31 NOTE — Progress Notes (Signed)
Reviewed and updated today  Indication for chronic opioid: Severe OA of multiple sites, Lumbar Spinal Stenosis, Chronic Pain Syndrome Medication and dose: Hydrocodone-APAP 10-325 mg # pills per month: 90 Last UDS date: Today Pain contract signed (date): Renewed today. Date narcotic database last reviewed (include red flags): Today. No red flags.   Pain Inventory (1-10 worse): Average Pain 7-8/10 Pain Right Now 8/10 My pain is constant (character i.e. sharp, stabbing, dull, constant etc)  Pain is worse with: Ambulation Relief from Meds: some relief of pain but not a huge change. Also taking Gabapentin 600 mg TID. Has follow-up with her specialists this week.  In the last 24 hours, has pain interfered with the following (1-10 greatest interference) ? General activity 5/10 Relation with others 3/10 Enjoyment of life 2/10 What TIME of day is your pain at its worst?   When medication runs out Sleep (in general) Fair  Mobility/Function: Assistance device: walker How many minutes can you walk? < 10 Ability to climb steps?  present but very difficult Do you drive? No  Neuro/Psych Sx: (bladder, bowel, weakness, dizziness, depression etc) Depression -- being treated Prior Studies: See EMR Physicians involved in your care: Any changes since last visit?  No change since last visit  Patient also endorses a lesion of her left lower abdomen x 1 months. Is not painful and non-pruritic. Is under a roll of skin so harder for her to assess. Denies any noted drainage or bleeding from the area.    Patient also noting lumps on her anterior thigh that are painful. Denies redness or swelling. Has been present for 1 month or so but still tender. Denies swelling of extremity. Has history of varicose veins/venous insufficiency.  Past Medical History:  Diagnosis Date  . Allergic rhinitis   . Anemia   . Anxiety   . Chicken pox   . Chronic back pain   . COPD (chronic obstructive pulmonary  disease) ([place])   . Depression   . Essential hypertension   . GERD (gastroesophageal reflux disease)   . Headache    migraines  . History of gastritis    EGD 2015  . History of home oxygen therapy    2 liters at hs last 6 months  . Hyperlipidemia   . Hypothyroidism   . Migraines   . Osteoarthritis    oa  . Scoliosis     Current Outpatient Medications on File Prior to Visit  Medication Sig Dispense Refill  . albuterol (PROAIR HFA) 108 (90 Base) MCG/ACT inhaler 2 puffs every 4 hours as needed only  if your can't catch your breath (Patient taking differently: Inhale 2 puffs into the lungs every 4 (four) hours as needed for wheezing or shortness of breath. ) 1 Inhaler 1  . ALPRAZolam (XANAX) 1 MG tablet TAKE 1 TABLET BY MOUTH THREE TIMES DAILY AS NEEDED FOR ANXIETY 90 tablet 0  . atorvastatin (LIPITOR) 20 MG tablet TAKE 1 TABLET BY MOUTH AT BEDTIME 90 tablet 1  . famotidine (PEPCID) 20 MG tablet Take 1 tablet (20 mg total) by mouth 2 (two) times daily. 60 tablet 11  . fluticasone (FLONASE) 50 MCG/ACT nasal spray Place 2 sprays into both nostrils daily.    Marland Kitchen gabapentin (NEURONTIN) 300 MG capsule Take 600 mg by mouth 3 times daily    . HYDROcodone-acetaminophen (NORCO) 10-325 MG tablet TAKE 1 TABLET BY MOUTH THREE TIMES DAILY 90 tablet 0  . INCRUSE ELLIPTA 62.5 MCG/INH AEPB INHALE 1 PUFF INTO THE LUNGS  DAILY 30 each 5  . levothyroxine (SYNTHROID) 75 MCG tablet TAKE 1 TABLET BY MOUTH DAILY BEFORE BREAKFAST 90 tablet 1  . loratadine (CLARITIN) 10 MG tablet Take 10 mg by mouth daily.    Marland Kitchen omeprazole (PRILOSEC) 40 MG capsule TAKE 1 CAPSULE BY MOUTH BEFORE BREAKFAST AND TAKE 1 CAPSULE BY MOUTH BEFORE DINNER 60 capsule 3  . OXYGEN Inhale 2 L into the lungs at bedtime.     . potassium chloride (K-DUR) 10 MEQ tablet TAKE 1 TABLET BY MOUTH AT BEDTIME 90 tablet 1  . sertraline (ZOLOFT) 100 MG tablet TAKE TWO TABLETS BY MOUTH AT BEDTIME 180 tablet 1  . SYMBICORT 160-4.5 MCG/ACT inhaler INHALE TWO  PUFFS BY MOUTH TWICE DAILY 10.2 g 3  . triamterene-hydrochlorothiazide (MAXZIDE-25) 37.5-25 MG tablet TAKE 1 TABLET BY MOUTH EVERY DAY 90 tablet 1   No current facility-administered medications on file prior to visit.     Allergies  Allergen Reactions  . Nsaids Diarrhea  . Aleve [Naproxen Sodium] Other (See Comments)    Headache   . Codeine Nausea Only and Other (See Comments)    GI upset  . Penicillins Nausea Only and Other (See Comments)    GI upset Has patient had a PCN reaction causing immediate rash, facial/tongue/throat swelling, SOB or lightheadedness with hypotension: No Has patient had a PCN reaction causing severe rash involving mucus membranes or skin necrosis: No Has patient had a PCN reaction that required hospitalization No Has patient had a PCN reaction occurring within the last 10 years: No If all of the above answers are "NO", then may proceed with Cephalosporin use.   . Sulfonamide Derivatives Hives    Family History  Problem Relation Age of Onset  . COPD Mother   . Heart disease Mother   . Lung disease Father        Asbestosis  . Heart attack Father   . Heart disease Father   . Cerebral aneurysm Brother   . Aneurysm Brother        Brain  . Epilepsy Son   . Arthritis Maternal Grandmother   . Heart disease Maternal Grandmother   . Asthma Maternal Grandfather   . Cancer Maternal Grandfather   . Arthritis Paternal Grandmother   . Heart disease Paternal Grandmother   . Stroke Paternal Grandmother   . Early death Paternal Grandfather   . Heart disease Paternal Grandfather     Social History   Socioeconomic History  . Marital status: Married    Spouse name: Not on file  . Number of children: Not on file  . Years of education: Not on file  . Highest education level: Not on file  Occupational History  . Not on file  Social Needs  . Financial resource strain: Not on file  . Food insecurity    Worry: Not on file    Inability: Not on file  .  Transportation needs    Medical: Not on file    Non-medical: Not on file  Tobacco Use  . Smoking status: Current Every Day Smoker    Packs/day: 1.50    Years: 46.00    Pack years: 69.00    Types: Cigarettes  . Smokeless tobacco: Never Used  Substance and Sexual Activity  . Alcohol use: No  . Drug use: No  . Sexual activity: Yes  Lifestyle  . Physical activity    Days per week: Not on file    Minutes per session: Not on file  . Stress:  Not on file  Relationships  . Social Herbalist on phone: Not on file    Gets together: Not on file    Attends religious service: Not on file    Active member of club or organization: Not on file    Attends meetings of clubs or organizations: Not on file    Relationship status: Not on file  Other Topics Concern  . Not on file  Social History Narrative  . Not on file   Review of Systems - See HPI.  All other ROS are negative.  BP 110/70   Pulse 93   Temp 98.4 F (36.9 C) (Skin)   Resp 16   Ht 5' 2"  (1.575 m)   Wt 260 lb (117.9 kg)   SpO2 97%   BMI 47.55 kg/m   Physical Exam Vitals signs reviewed.  Constitutional:      Appearance: Normal appearance.  HENT:     Head: Normocephalic and atraumatic.     Mouth/Throat:     Mouth: Mucous membranes are moist.  Eyes:     Conjunctiva/sclera: Conjunctivae normal.  Neck:     Musculoskeletal: Neck supple.  Cardiovascular:     Rate and Rhythm: Normal rate and regular rhythm.     Pulses: Normal pulses.          Dorsalis pedis pulses are 2+ on the right side and 2+ on the left side.       Posterior tibial pulses are 2+ on the right side and 2+ on the left side.     Heart sounds: Normal heart sounds.     Comments: No peripheral edema noted on exam.  Venous varicosities noted bilaterally.  On L Upper anterior thigh she has knotting noted over superficial vessels with tenderness, concerning for SVT. Pulmonary:     Effort: Pulmonary effort is normal.     Breath sounds: Normal  breath sounds.  Skin:      Neurological:     Mental Status: She is alert.      Assessment/Plan: 1. Encounter for chronic pain management [facility] on file. UDS updated today. Will try to get approval for a longer-acting medication for her to get better control of pain levels. - Pain Mgmt, Profile 8 w/Conf, U  2. Hyperlipidemia, unspecified hyperlipidemia type Overdue for repeat check. Is fasting today. Labs to be obtained. - Comp Met (CMET) - Lipid Profile  3. Dermatofibroma Healing but taking a while due to moisture in the area. Some questionable yeast noted in the area. Hygiene practices reviewed. Rx nystatin powder to apply in the area to help with yeast and keep area dry. If not continuing to heal with these changes, will refer to dermatology.  4. Chronic superficial venous thrombosis of right lower extremity Imaging to assess further. Ice and elevation recommended. No obvious findings on examination that would be concerning for DVT. - US Venous Img Lower Unilateral Right; Future   [name], PA-C

## 2018-08-31 NOTE — Telephone Encounter (Signed)
Patient called in stating that she would like something else other than Oxycodone if the med cannot be approved within the next day. Patient also needed to know what powder Einar Pheasant was wanting her to start using. AVS did not say the brand of powder on it. Please advise.

## 2018-09-01 ENCOUNTER — Other Ambulatory Visit: Payer: Self-pay | Admitting: Physician Assistant

## 2018-09-01 IMAGING — DX DG KNEE COMPLETE 4+V*R*
4 series · 4 of 4 positions shown · non-contrast
Comparison: None.

CLINICAL DATA: Fall with pain

EXAM:
RIGHT KNEE - COMPLETE 4+ VIEW

[knee ap (1 of 3)]
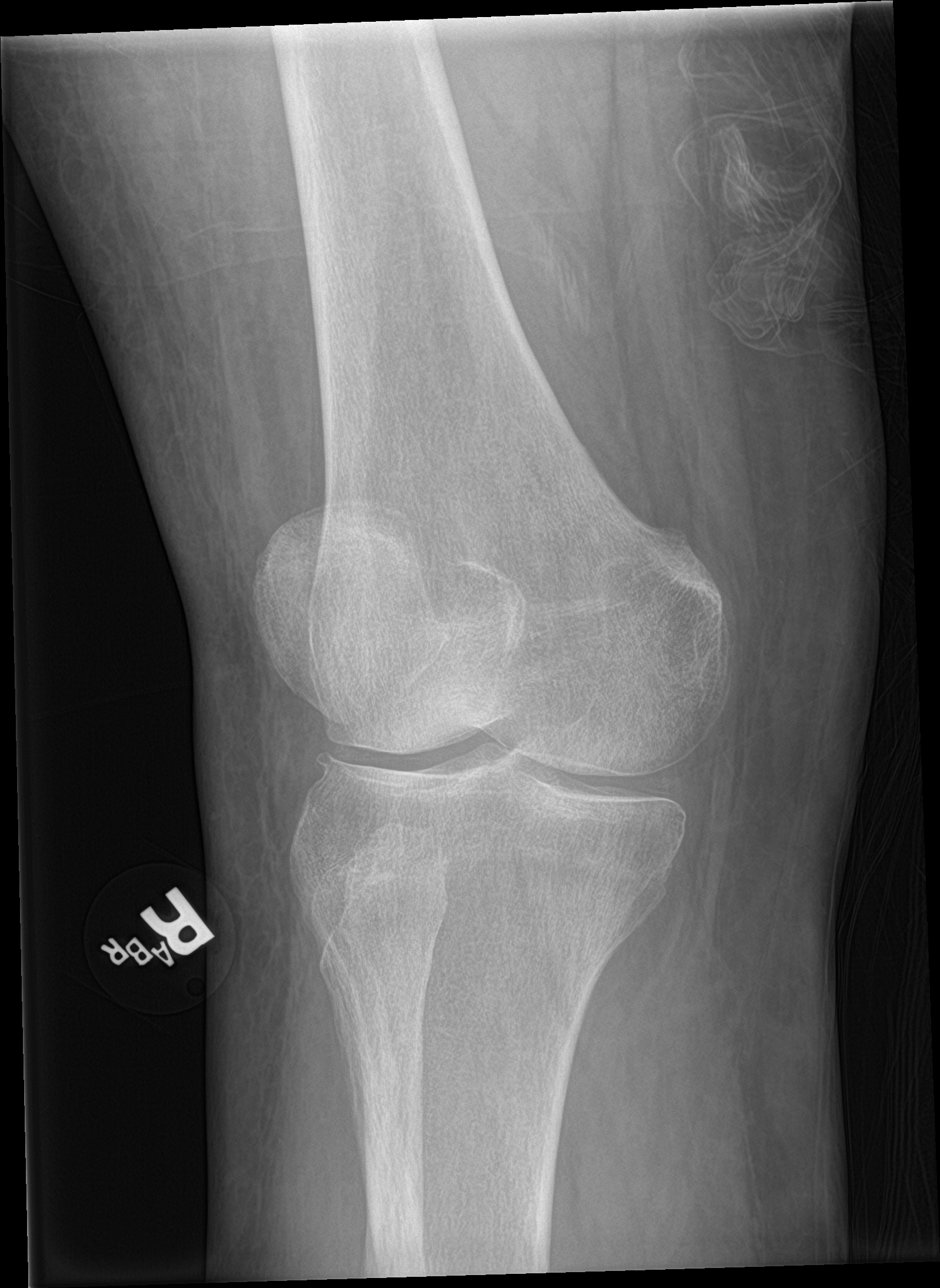

[knee ap (2 of 3)]
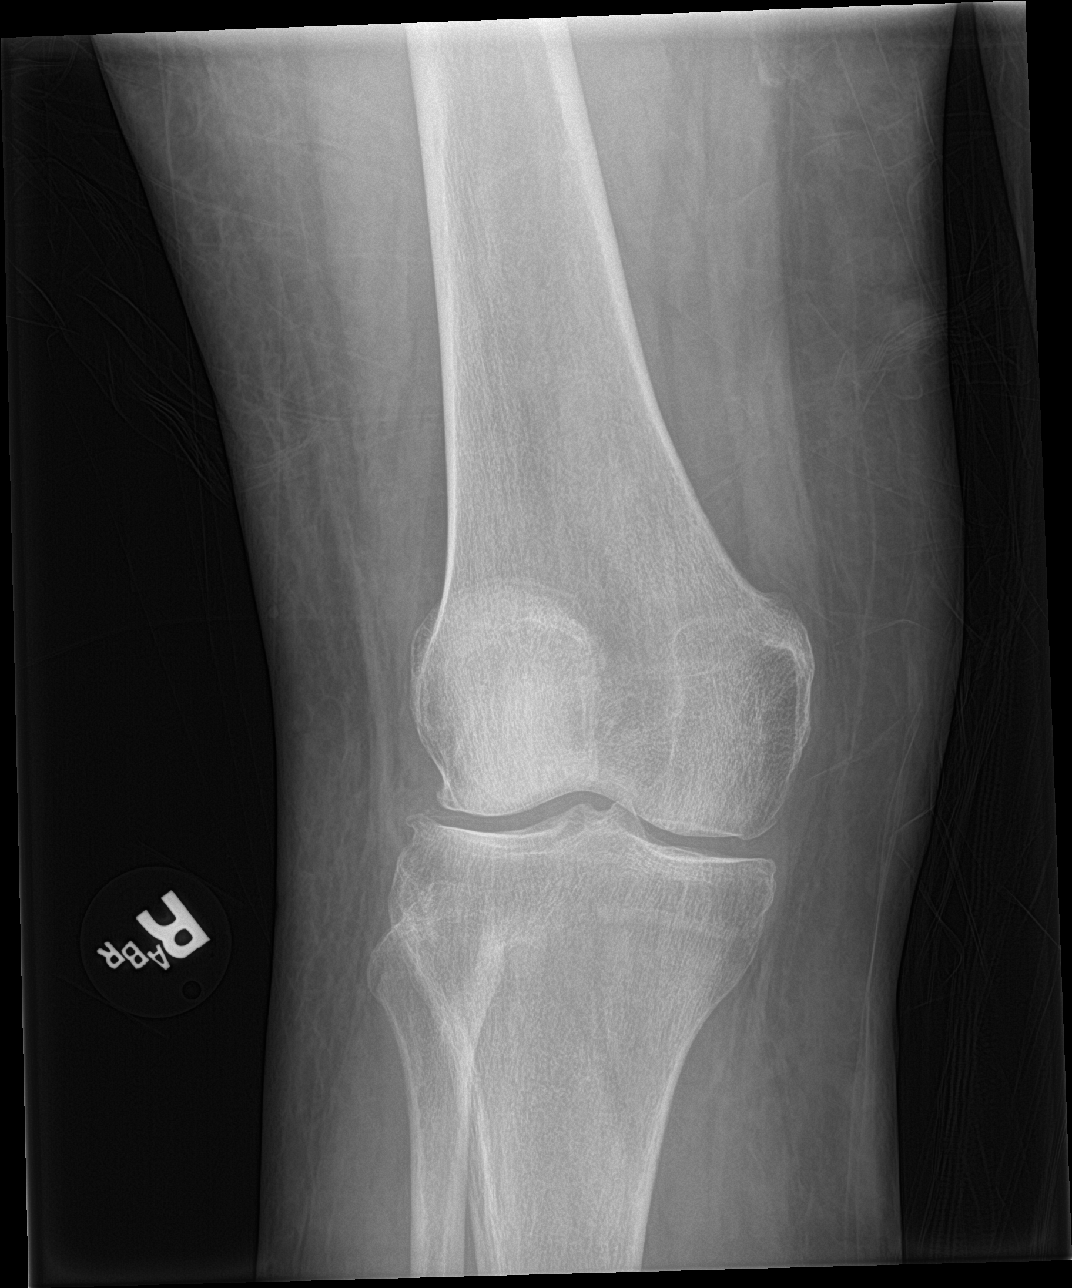

[knee lat]
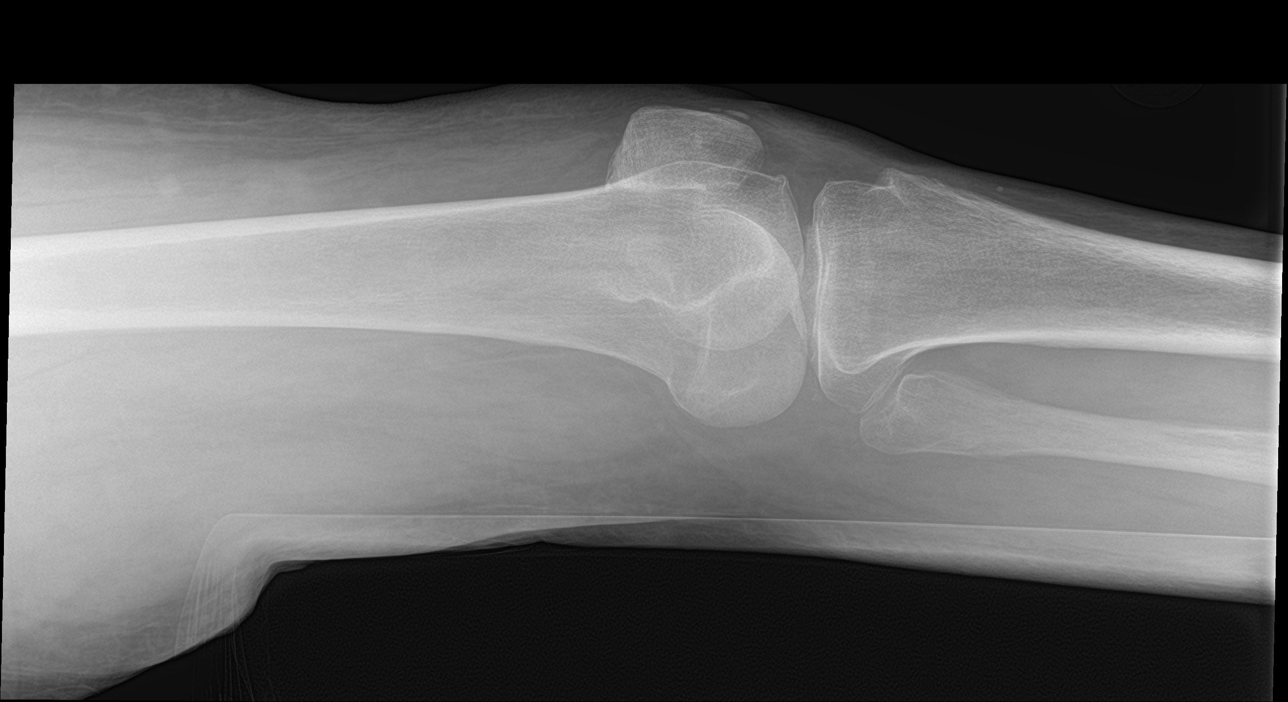

[knee ap (3 of 3)]
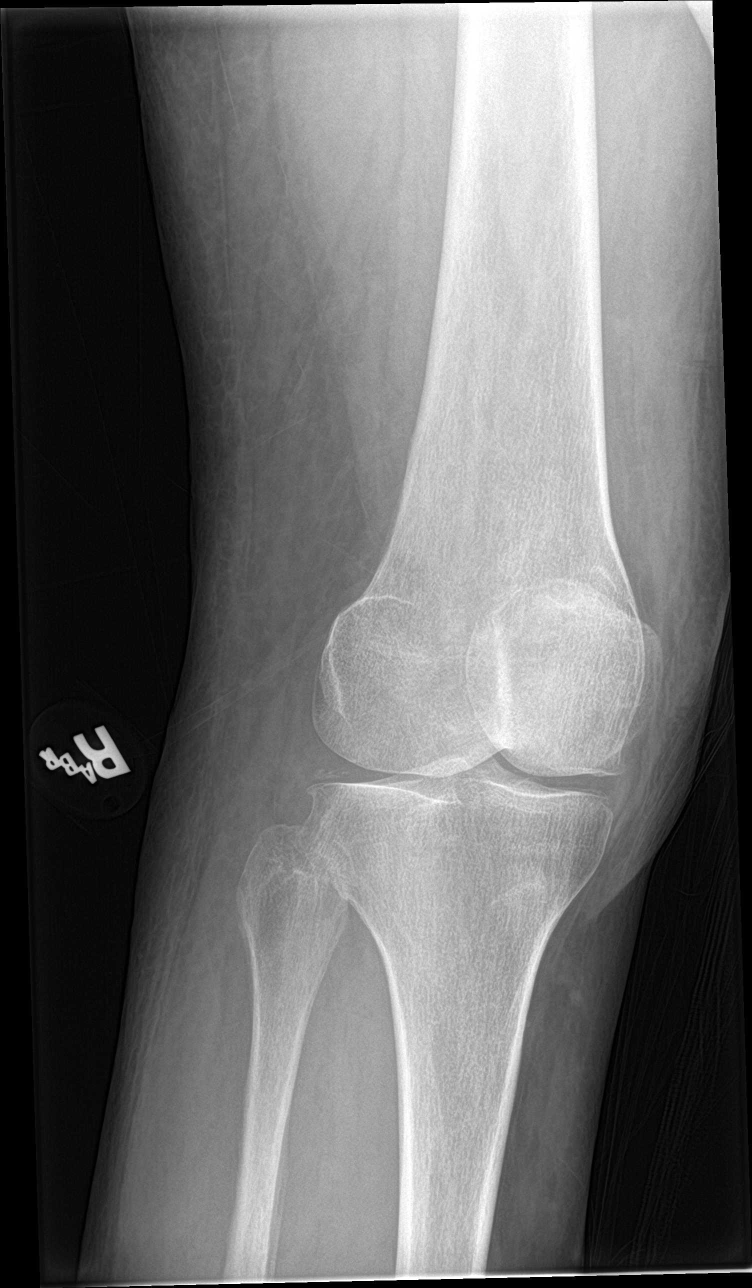

[4 of 4 positions shown; findings below may reference images not displayed]

FINDINGS: No fracture or dislocation is visualized. Mild to moderate medial
and lateral joint space compartment narrowing with small
osteophytes. Faint lateral and medial joint space calcifications.
IMPRESSION: 1. No acute osseous abnormality
2. Mild to moderate degenerative changes
3. Chondrocalcinosis

## 2018-09-01 IMAGING — DX DG HIP (WITH OR WITHOUT PELVIS) 2-3V*R*
3 series · 3 of 3 positions shown · non-contrast
Comparison: 01/19/2016

CLINICAL DATA: Right hip and knee pain, fall

EXAM:
DG HIP (WITH OR WITHOUT PELVIS) 2-3V RIGHT

[hip ap]
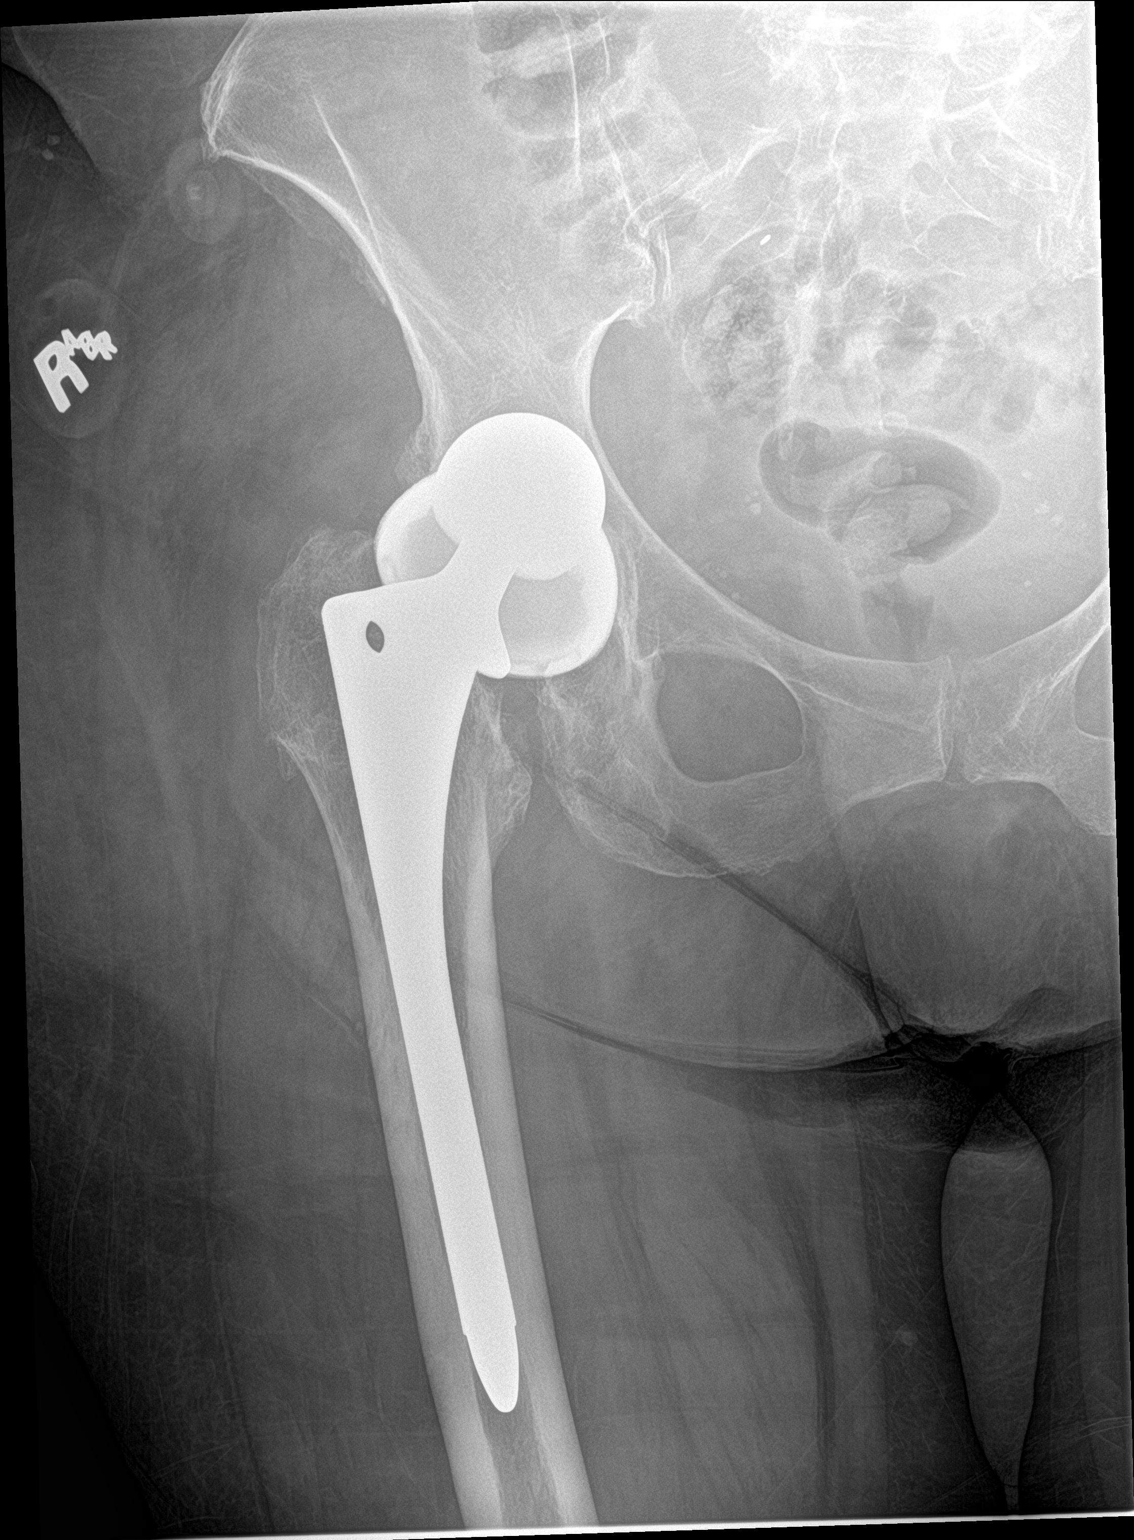

[hip lat]
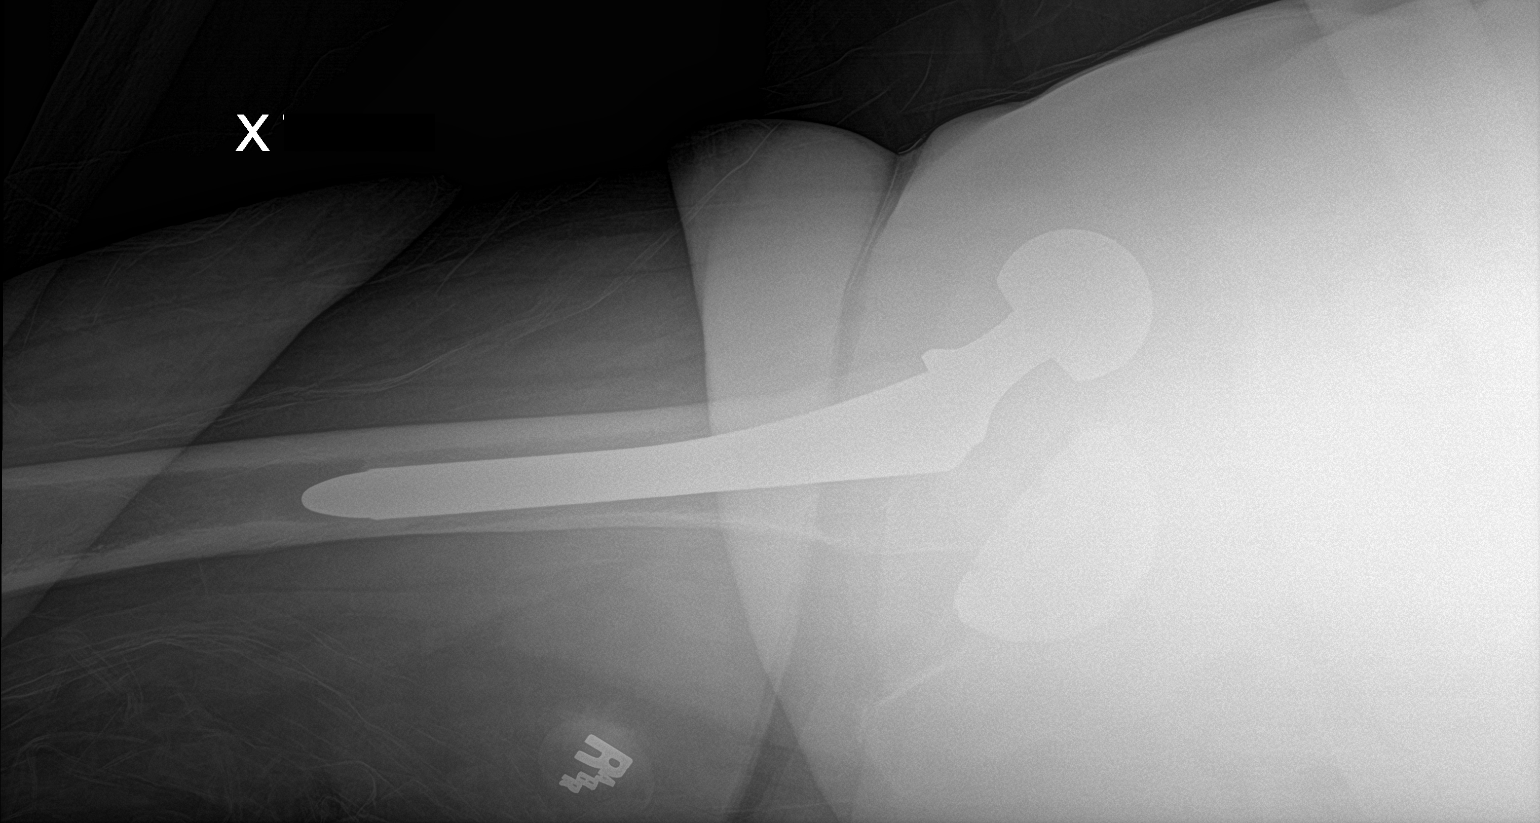

[pelvis ap]
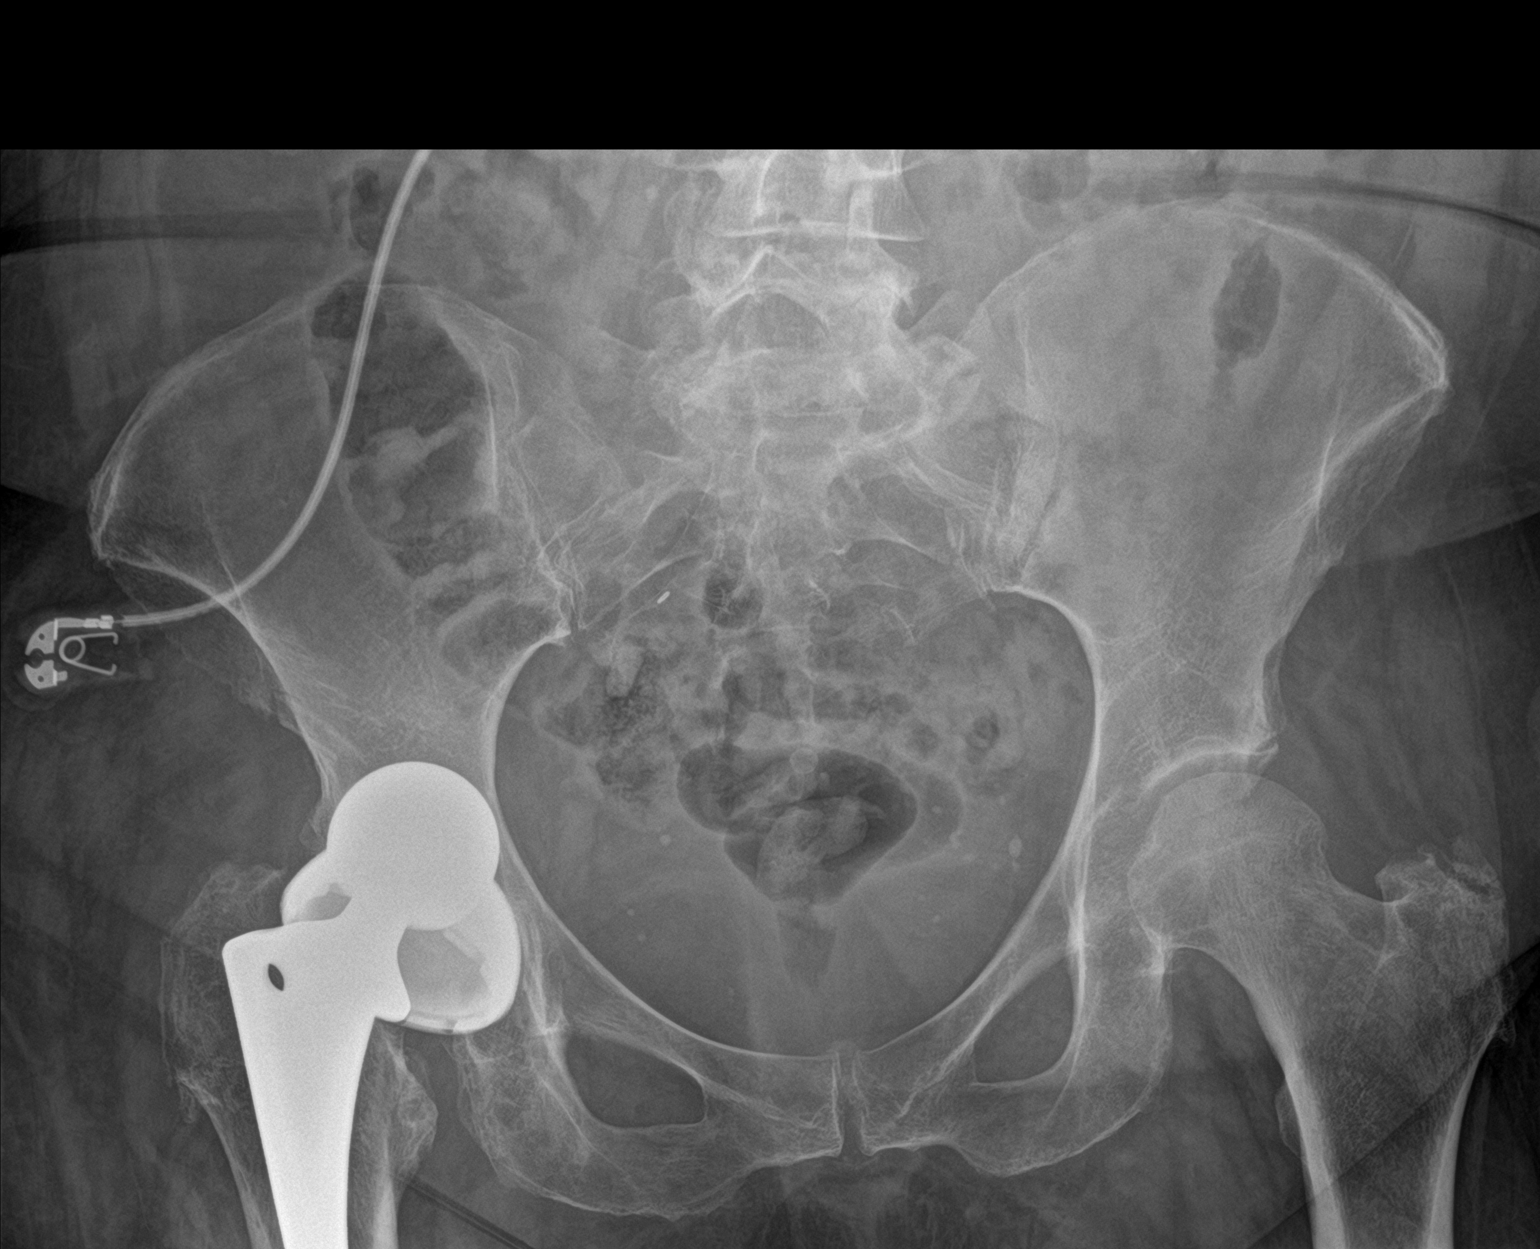

[3 of 3 positions shown; findings below may reference images not displayed]

FINDINGS: Left femoral head projects in joint. Pubic symphysis appears intact.
The SI joints are symmetric. Multiple calcified phleboliths in the
pelvis.

The patient is status post right hip replacement. There is anterior,
superior dislocation of the right femoral portion of the prosthesis
with respect to the acetabular cup. There is no obvious fracture.
IMPRESSION: Abnormal alignment of right hip replacement. The femoral component
is dislocated superiorly, anteriorly and slightly medial to the
acetabular portion of the replacement.

## 2018-09-01 MED ORDER — OXYCODONE HCL 5 MG PO TABS: 5.0000 mg | ORAL_TABLET | Freq: Four times a day (QID) | ORAL | 0 refills | Status: DC | PRN

## 2018-09-01 NOTE — Telephone Encounter (Signed)
Advised patient of medication issues with insurance. She is agreeable with starting immediate release Oxycodone. Rx sent to the pharmacy.

## 2018-09-01 NOTE — Telephone Encounter (Signed)
Please advise 

## 2018-09-01 NOTE — Telephone Encounter (Signed)
Patient calling about the next step

## 2018-09-01 NOTE — Telephone Encounter (Signed)
[facility]  is calling and since oxycodone was denied they need to know what is the next step

## 2018-09-01 NOTE — Telephone Encounter (Signed)
Was unable to get insurance to approve. They would only pay for ER Hydrocodone which I feel would be subtherapeutic or a very high-dose of Oxycodone ER which is too much. Will switch to regular release Oxycodone. Script sent to pharmacy.

## 2018-09-02 DIAGNOSIS — M1712 Unilateral primary osteoarthritis, left knee: Secondary | ICD-10-CM | POA: Diagnosis not present

## 2018-09-02 DIAGNOSIS — M25562 Pain in left knee: Secondary | ICD-10-CM | POA: Diagnosis not present

## 2018-09-03 LAB — PAIN MGMT, PROFILE 8 W/CONF, U
6 Acetylmorphine: NEGATIVE ng/mL
Alphahydroxyalprazolam: 1044 ng/mL
Alphahydroxymidazolam: NEGATIVE ng/mL
Alphahydroxytriazolam: NEGATIVE ng/mL
Aminoclonazepam: NEGATIVE ng/mL
Amphetamines: NEGATIVE ng/mL
Benzodiazepines: POSITIVE ng/mL
Buprenorphine, Urine: NEGATIVE ng/mL
Cocaine Metabolite: NEGATIVE ng/mL
Codeine: NEGATIVE ng/mL
Creatinine: 247 mg/dL
Hydrocodone: 4119 ng/mL
Hydromorphone: NEGATIVE ng/mL
Hydroxyethylflurazepam: NEGATIVE ng/mL
Lorazepam: NEGATIVE ng/mL
MDMA: NEGATIVE ng/mL
Morphine: NEGATIVE ng/mL
Nordiazepam: NEGATIVE ng/mL
Norhydrocodone: 6020 ng/mL
Opiates: POSITIVE ng/mL
Oxazepam: NEGATIVE ng/mL
Oxidant: NEGATIVE ug/mL
Temazepam: NEGATIVE ng/mL
pH: 6 (ref 4.5–9.0)

## 2018-09-08 ENCOUNTER — Telehealth: Payer: Self-pay | Admitting: Physician Assistant

## 2018-09-08 NOTE — Telephone Encounter (Signed)
Stated that the medication only helps for a few hours after taking, still in a lot of pain. Unable to take tylenol for break through pain because of upcoming hepatic panel testing per PCP. Please advise

## 2018-09-08 NOTE — Telephone Encounter (Unsigned)
Copied from [place] [phone]. Topic: General - Call Back - No Documentation >> Sep 08, 2018 12:57 PM Erick Blinks wrote: [phone] requesting call back from nurse. Pt states that medication is not working. oxyCODONE (ROXICODONE) 5 MG immediate release tablet

## 2018-09-09 ENCOUNTER — Other Ambulatory Visit: Payer: Self-pay

## 2018-09-09 ENCOUNTER — Other Ambulatory Visit: Payer: Self-pay | Admitting: Physician Assistant

## 2018-09-09 DIAGNOSIS — R7989 Other specified abnormal findings of blood chemistry: Secondary | ICD-10-CM

## 2018-09-09 DIAGNOSIS — E782 Mixed hyperlipidemia: Secondary | ICD-10-CM

## 2018-09-09 NOTE — Telephone Encounter (Signed)
Patient gave verbal understanding of new script directions. Informed patient to call back over the next few days to let us know if the new dosage helps with pain for longer periods of time. Also wanted me to go over her previous blood work. Patient stated that she was fasting and voiced concerns over her triglyceride levels. Wanted to know if lipid panel could also be rechecked at next nurse visit. Please advise

## 2018-09-09 NOTE — Telephone Encounter (Signed)
Patient aware that next visit is fasting labs.

## 2018-09-09 NOTE — Telephone Encounter (Signed)
Have her take 2 tablets (10 mg) instead of 1 every 6-8 hours over the next couple of days to see how this helps. This way we can decide what next steps will be.

## 2018-09-09 NOTE — Telephone Encounter (Signed)
Ok to order 

## 2018-09-11 ENCOUNTER — Other Ambulatory Visit: Payer: Managed Care, Other (non HMO)

## 2018-09-14 DIAGNOSIS — M48061 Spinal stenosis, lumbar region without neurogenic claudication: Secondary | ICD-10-CM | POA: Diagnosis not present

## 2018-09-14 DIAGNOSIS — M545 Low back pain: Secondary | ICD-10-CM | POA: Diagnosis not present

## 2018-09-15 ENCOUNTER — Other Ambulatory Visit: Payer: Managed Care, Other (non HMO)

## 2018-09-17 ENCOUNTER — Other Ambulatory Visit: Payer: Self-pay | Admitting: Physician Assistant

## 2018-09-17 NOTE — Telephone Encounter (Signed)
Last refill: 7.31.20 #90, 0 Last OV: 8.3.20

## 2018-09-25 ENCOUNTER — Other Ambulatory Visit: Payer: Self-pay | Admitting: Physician Assistant

## 2018-09-25 ENCOUNTER — Telehealth: Payer: Self-pay | Admitting: *Deleted

## 2018-09-25 NOTE — Telephone Encounter (Signed)
Erroneous encounter

## 2018-09-25 NOTE — Telephone Encounter (Signed)
Last refill: 7.31.20 #90, 0 Last OV: 8.3.20 dx. Encounter for chronic pain

## 2018-09-28 ENCOUNTER — Encounter

## 2018-09-28 ENCOUNTER — Encounter: Payer: Self-pay | Admitting: Neurology

## 2018-09-28 ENCOUNTER — Ambulatory Visit: Payer: Managed Care, Other (non HMO) | Admitting: Neurology

## 2018-09-30 ENCOUNTER — Ambulatory Visit: Payer: Managed Care, Other (non HMO)

## 2018-09-30 ENCOUNTER — Other Ambulatory Visit: Payer: Self-pay | Admitting: *Deleted

## 2018-09-30 MED ORDER — OXYCODONE HCL 5 MG PO TABS: 5.0000 mg | ORAL_TABLET | Freq: Four times a day (QID) | ORAL | 0 refills | Status: DC | PRN

## 2018-09-30 NOTE — Telephone Encounter (Signed)
Last OV 08/31/2018 Last Filled: 09/01/2018 #120, 0

## 2018-10-02 ENCOUNTER — Other Ambulatory Visit: Payer: Self-pay

## 2018-10-02 ENCOUNTER — Encounter: Payer: Self-pay | Admitting: Physician Assistant

## 2018-10-02 ENCOUNTER — Ambulatory Visit (INDEPENDENT_AMBULATORY_CARE_PROVIDER_SITE_OTHER): Payer: Managed Care, Other (non HMO)

## 2018-10-02 DIAGNOSIS — I10 Essential (primary) hypertension: Secondary | ICD-10-CM

## 2018-10-02 DIAGNOSIS — E785 Hyperlipidemia, unspecified: Secondary | ICD-10-CM

## 2018-10-02 DIAGNOSIS — Z23 Encounter for immunization: Secondary | ICD-10-CM | POA: Diagnosis not present

## 2018-10-02 LAB — COMPREHENSIVE METABOLIC PANEL
ALT: 29 U/L (ref 0–35)
AST: 47 U/L — ABNORMAL HIGH (ref 0–37)
Albumin: 3.5 g/dL (ref 3.5–5.2)
Alkaline Phosphatase: 62 U/L (ref 39–117)
BUN: 15 mg/dL (ref 6–23)
CO2: 29 mEq/L (ref 19–32)
Calcium: 8.3 mg/dL — ABNORMAL LOW (ref 8.4–10.5)
Chloride: 99 mEq/L (ref 96–112)
Creatinine, Ser: 1.02 mg/dL (ref 0.40–1.20)
GFR: 54.79 mL/min — ABNORMAL LOW (ref >60.00)
Glucose, Bld: 93 mg/dL (ref 70–99)
Potassium: 3.9 mEq/L (ref 3.5–5.1)
Sodium: 138 mEq/L (ref 135–145)
Total Bilirubin: 0.3 mg/dL (ref 0.2–1.2)
Total Protein: 6.7 g/dL (ref 6.0–8.3)

## 2018-10-02 LAB — LIPID PANEL
Cholesterol: 126 mg/dL (ref 0–200)
HDL: 30.9 mg/dL — ABNORMAL LOW (ref >39.00)
LDL Cholesterol: 64 mg/dL (ref 0–99)
NonHDL: 94.99
Total CHOL/HDL Ratio: 4
Triglycerides: 155 mg/dL — ABNORMAL HIGH (ref 0.0–149.0)
VLDL: 31 mg/dL (ref 0.0–40.0)

## 2018-10-06 ENCOUNTER — Other Ambulatory Visit: Payer: Self-pay

## 2018-10-06 DIAGNOSIS — R790 Abnormal level of blood mineral: Secondary | ICD-10-CM

## 2018-10-06 DIAGNOSIS — E785 Hyperlipidemia, unspecified: Secondary | ICD-10-CM

## 2018-10-15 ENCOUNTER — Other Ambulatory Visit: Payer: Self-pay | Admitting: Internal Medicine

## 2018-10-19 DIAGNOSIS — M48061 Spinal stenosis, lumbar region without neurogenic claudication: Secondary | ICD-10-CM | POA: Diagnosis not present

## 2018-10-23 ENCOUNTER — Other Ambulatory Visit: Payer: Self-pay

## 2018-10-23 MED ORDER — ALPRAZOLAM 1 MG PO TABS: 1.0000 mg | ORAL_TABLET | Freq: Three times a day (TID) | ORAL | 0 refills | Status: DC | PRN

## 2018-10-23 NOTE — Telephone Encounter (Signed)
Last refill: 8.28.20 #90, 0 Last OV: 8.3.20 dx. Chronic pain management

## 2018-10-28 ENCOUNTER — Other Ambulatory Visit: Payer: Self-pay | Admitting: Adult Health

## 2018-10-28 ENCOUNTER — Other Ambulatory Visit: Payer: Self-pay | Admitting: Emergency Medicine

## 2018-10-28 MED ORDER — OXYCODONE HCL 5 MG PO TABS: 5.0000 mg | ORAL_TABLET | Freq: Four times a day (QID) | ORAL | 0 refills | Status: DC | PRN

## 2018-10-28 MED ORDER — ALBUTEROL SULFATE HFA 108 (90 BASE) MCG/ACT IN AERS: INHALATION_SPRAY | RESPIRATORY_TRACT | 0 refills | Status: DC

## 2018-10-28 NOTE — Telephone Encounter (Signed)
Indication for chronic opioid: Severe Osteoarthritis, Lumbar Spinal Stenosis, Chronic Pain Syndrome Medication and dose: Oxycodone 5 mg  # pills per month: 120 Last UDS date: 08/31/18 Opioid Treatment Agreement signed (Y/N): Yes for Xanax not for Oxycodone Opioid Treatment Agreement last reviewed with patient:   [facility] reviewed this encounter (include red flags):     LOV: 08/31/18

## 2018-10-28 NOTE — Telephone Encounter (Signed)
Patient requesting medication today due to husband being laid off today and today the insurance ends.

## 2018-11-04 ENCOUNTER — Telehealth: Payer: Self-pay

## 2018-11-04 ENCOUNTER — Other Ambulatory Visit: Payer: Self-pay

## 2018-11-04 ENCOUNTER — Ambulatory Visit (INDEPENDENT_AMBULATORY_CARE_PROVIDER_SITE_OTHER): Payer: Medicare Other

## 2018-11-04 DIAGNOSIS — E785 Hyperlipidemia, unspecified: Secondary | ICD-10-CM | POA: Diagnosis not present

## 2018-11-04 DIAGNOSIS — R790 Abnormal level of blood mineral: Secondary | ICD-10-CM

## 2018-11-04 DIAGNOSIS — M25562 Pain in left knee: Secondary | ICD-10-CM | POA: Diagnosis not present

## 2018-11-04 DIAGNOSIS — M1712 Unilateral primary osteoarthritis, left knee: Secondary | ICD-10-CM | POA: Diagnosis not present

## 2018-11-04 NOTE — Telephone Encounter (Signed)
Patient seen today for lab visit. Stated during her visit that her current pain regimen is not helping with her pain. Currently taking ROXICODONE 10 mg every 6-8 hours. Please advise

## 2018-11-05 ENCOUNTER — Ambulatory Visit: Payer: Medicare Other | Admitting: Neurology

## 2018-11-05 ENCOUNTER — Encounter: Payer: Self-pay | Admitting: Neurology

## 2018-11-05 ENCOUNTER — Ambulatory Visit: Payer: Managed Care, Other (non HMO)

## 2018-11-05 VITALS — BP 126/70 | HR 75 | Temp 98.0°F | Ht 62.0 in | Wt 264.0 lb

## 2018-11-05 DIAGNOSIS — R251 Tremor, unspecified: Secondary | ICD-10-CM | POA: Diagnosis not present

## 2018-11-05 DIAGNOSIS — G894 Chronic pain syndrome: Secondary | ICD-10-CM

## 2018-11-05 LAB — LIPID PANEL
Cholesterol: 135 mg/dL (ref 0–200)
HDL: 34.8 mg/dL — ABNORMAL LOW (ref >39.00)
LDL Cholesterol: 61 mg/dL (ref 0–99)
NonHDL: 100.24
Total CHOL/HDL Ratio: 4
Triglycerides: 194 mg/dL — ABNORMAL HIGH (ref 0.0–149.0)
VLDL: 38.8 mg/dL (ref 0.0–40.0)

## 2018-11-05 LAB — HEPATIC FUNCTION PANEL
ALT: 25 U/L (ref 0–35)
AST: 37 U/L (ref 0–37)
Albumin: 3.8 g/dL (ref 3.5–5.2)
Alkaline Phosphatase: 58 U/L (ref 39–117)
Bilirubin, Direct: 0.1 mg/dL (ref 0.0–0.3)
Total Bilirubin: 0.4 mg/dL (ref 0.2–1.2)
Total Protein: 6.7 g/dL (ref 6.0–8.3)

## 2018-11-05 LAB — COMPREHENSIVE METABOLIC PANEL
ALT: 25 U/L (ref 0–35)
AST: 37 U/L (ref 0–37)
Albumin: 3.8 g/dL (ref 3.5–5.2)
Alkaline Phosphatase: 58 U/L (ref 39–117)
BUN: 14 mg/dL (ref 6–23)
CO2: 30 mEq/L (ref 19–32)
Calcium: 8.3 mg/dL — ABNORMAL LOW (ref 8.4–10.5)
Chloride: 101 mEq/L (ref 96–112)
Creatinine, Ser: 0.97 mg/dL (ref 0.40–1.20)
GFR: 58.05 mL/min — ABNORMAL LOW (ref >60.00)
Glucose, Bld: 87 mg/dL (ref 70–99)
Potassium: 4.2 mEq/L (ref 3.5–5.1)
Sodium: 139 mEq/L (ref 135–145)
Total Bilirubin: 0.4 mg/dL (ref 0.2–1.2)
Total Protein: 6.7 g/dL (ref 6.0–8.3)

## 2018-11-05 NOTE — Patient Instructions (Signed)
Give a trial on a lower dose of the gabapentin taking 300 mg three times a day.

## 2018-11-05 NOTE — Telephone Encounter (Signed)
Recommend appointment to discuss some short-term changes we can make. Really need to get her back in with pain specialist if requiring hire doses of narcotic pain medication.

## 2018-11-05 NOTE — Progress Notes (Signed)
Reason for visit: Tremors, walking difficulty  Referring physician: Dr. Marcelline Deist is a 63 y.o. female  History of present illness:  [name] is a 63 year old right-handed white female with a history of obesity and a history of chronic low back pain.  The patient has had multiple right hip revisions and she has severe degenerative arthritis of the left knee.  She is followed through orthopedic surgery for this.  She reports episodes where her left leg will collapse underneath her suddenly, she is not sure that there is any pain with this.  She is not using a walker at this point.  She may fall if she is not using a walker.  She is on gabapentin, she claims she has been on 600 mg 3 times daily for least a year, the episodes of falling began about 2 months ago.  The patient may also have other episodes independent of the leg issues, she will have sudden onset of shakes and tremors of the arms that will come and go, this is on a daily basis.  She goes on to say that she is under a lot of stress, she is having problems coping with her husband, and she lost her daughter within the last year.  More recently, her son has moved into their home, this is also a stress for her.  The patient is on oxygen at nighttime, she reports she gets short winded with minimal activity, she indicates that her left leg collapse is more likely to occur when she has been walking more than a short distance.  She believes that it is possible that the arm tremors may come on when she is upset or anxious about something.  She takes Xanax 1 mg 3 times daily but she claims that she needs more than what she is getting.  She is on Zoloft as well.  The patient does have some trouble with controlling the bladder, she has no problems with the bowels.  She has some troubles with handwriting because of the tremor, she has no problems of feeding herself.  She is sent to this office for an evaluation.  Past Medical History:   Diagnosis Date  . Allergic rhinitis   . Anemia   . Anxiety   . Chicken pox   . Chronic back pain   . COPD (chronic obstructive pulmonary disease) ([place])   . Depression   . Essential hypertension   . GERD (gastroesophageal reflux disease)   . Headache    migraines  . History of gastritis    EGD 2015  . History of home oxygen therapy    2 liters at hs last 6 months  . Hyperlipidemia   . Hypothyroidism   . Migraines   . Osteoarthritis    oa  . Scoliosis     Past Surgical History:  Procedure Laterality Date  . APPENDECTOMY     1985  . BIOPSY  07/24/2017   Procedure: BIOPSY;  Surgeon: Milus Banister, MD;  Location: [facility];  Service: Endoscopy;;  . CARDIAC CATHETERIZATION N/A 10/31/2015   Procedure: Left Heart Cath and Coronary Angiography;  Surgeon: Leonie Man, MD;  Location: [facility];  Service: Cardiovascular;  Laterality: N/A;  . CARPAL TUNNEL RELEASE Left   . CARPAL TUNNEL RELEASE Right   . CHOLECYSTECTOMY  late 1980's  . COLONOSCOPY WITH PROPOFOL N/A 07/24/2017   Procedure: COLONOSCOPY WITH PROPOFOL;  Surgeon: Milus Banister, MD;  Location: [facility];  Service: Endoscopy;  Laterality: N/A;  . ESOPHAGOGASTRODUODENOSCOPY N/A 07/24/2017   Procedure: ESOPHAGOGASTRODUODENOSCOPY (EGD);  Surgeon: Milus Banister, MD;  Location: [facility];  Service: Endoscopy;  Laterality: N/A;  . GALLBLADDER SURGERY  1991  . HIP CLOSED REDUCTION Right 01/08/2016   Procedure: CLOSED MANIPULATION HIP;  Surgeon: Susa Day, MD;  Location: [facility];  Service: Orthopedics;  Laterality: Right;  . HIP CLOSED REDUCTION Right 01/19/2016   Procedure: ATTEMPTED CLOSED REDUCTION RIGHT HIP;  Surgeon: Wylene Simmer, MD;  Location: [facility];  Service: Orthopedics;  Laterality: Right;  . HIP CLOSED REDUCTION Right 01/20/2016   Procedure: CLOSED REDUCTION RIGHT TOTAL HIP;  Surgeon: Paralee Cancel, MD;  Location: [facility];  Service: Orthopedics;  Laterality: Right;  . HIP CLOSED REDUCTION  Right 02/17/2016   Procedure: CLOSED REDUCTION RIGHT TOTAL HIP;  Surgeon: Rod Can, MD;  Location: [facility];  Service: Orthopedics;  Laterality: Right;  . HIP CLOSED REDUCTION Right 02/28/2016   Procedure: CLOSED REDUCTION HIP;  Surgeon: Nicholes Stairs, MD;  Location: [facility];  Service: Orthopedics;  Laterality: Right;  . POLYPECTOMY  07/24/2017   Procedure: POLYPECTOMY;  Surgeon: Milus Banister, MD;  Location: [facility];  Service: Endoscopy;;  . TONSILLECTOMY    . TOTAL ABDOMINAL HYSTERECTOMY     1985, with 1 ovary removed and 2 nd ovary removed 2003  . TOTAL HIP ARTHROPLASTY Right    Original surgery 2006 with revision 2010  . TOTAL HIP REVISION Right 01/01/2016   Procedure: TOTAL HIP REVISION;  Surgeon: Paralee Cancel, MD;  Location: [facility];  Service: Orthopedics;  Laterality: Right;  . TOTAL HIP REVISION Right 03/02/2016   Procedure: TOTAL HIP REVISION;  Surgeon: Paralee Cancel, MD;  Location: [facility];  Service: Orthopedics;  Laterality: Right;  . TOTAL HIP REVISION Right 09/02/2016   Procedure: Right hip constrained liner- posterior;  Surgeon: Paralee Cancel, MD;  Location: [facility];  Service: Orthopedics;  Laterality: Right;  . ULNAR NERVE TRANSPOSITION Right     Family History  Problem Relation Age of Onset  . COPD Mother   . Heart disease Mother   . Lung disease Father        Asbestosis  . Heart attack Father   . Heart disease Father   . Cerebral aneurysm Brother   . Aneurysm Brother        Brain  . Epilepsy Son   . Arthritis Maternal Grandmother   . Heart disease Maternal Grandmother   . Asthma Maternal Grandfather   . Cancer Maternal Grandfather   . Arthritis Paternal Grandmother   . Heart disease Paternal Grandmother   . Stroke Paternal Grandmother   . Early death Paternal Grandfather   . Heart disease Paternal Grandfather     Social history:  reports that she has been smoking cigarettes. She has a 69.00 pack-year smoking history. She has never used smokeless  tobacco. She reports that she does not drink alcohol or use drugs.  Medications:  Prior to Admission medications   Medication Sig Start Date End Date Taking? Authorizing Provider  albuterol (PROAIR HFA) 108 (90 Base) MCG/ACT inhaler 2 puffs every 4 hours as needed only  if your can't catch your breath 10/28/18  Yes Brunetta Jeans, PA-C  albuterol (VENTOLIN HFA) 108 (90 Base) MCG/ACT inhaler Inhale 2 puffs into the lungs every 4 (four) hours as needed for wheezing or shortness of breath. 11/02/18  Yes Tanda Rockers, MD  ALPRAZolam Duanne Moron) 1 MG tablet Take 1 tablet (1 mg  total) by mouth 3 (three) times daily as needed. for anxiety 10/23/18  Yes Brunetta Jeans, PA-C  atorvastatin (LIPITOR) 20 MG tablet TAKE 1 TABLET BY MOUTH AT BEDTIME 08/17/18  Yes Brunetta Jeans, PA-C  famotidine (PEPCID) 20 MG tablet Take 1 tablet (20 mg total) by mouth 2 (two) times daily. 02/23/18  Yes Milus Banister, MD  fluticasone [facility]) 50 MCG/ACT nasal spray Place 2 sprays into both nostrils daily.   Yes [provider]  gabapentin (NEURONTIN) 300 MG capsule Take 600 mg by mouth 3 times daily   Yes [provider]  INCRUSE ELLIPTA 62.5 MCG/INH AEPB INHALE 1 PUFF INTO THE LUNGS DAILY 05/20/18  Yes Parrett, Tammy S, NP  levothyroxine (SYNTHROID) 75 MCG tablet TAKE 1 TABLET BY MOUTH DAILY BEFORE BREAKFAST 05/20/18  Yes Brunetta Jeans, PA-C  loratadine (CLARITIN) 10 MG tablet Take 10 mg by mouth daily.   Yes [provider]  meloxicam (MOBIC) 15 MG tablet Take 15 mg by mouth daily. 10/26/18  Yes [provider]  nystatin (MYCOSTATIN/NYSTOP) powder Apply topically 2 (two) times daily. 08/31/18  Yes Brunetta Jeans, PA-C  omeprazole (PRILOSEC) 40 MG capsule TAKE 1 CAPSULE BY MOUTH BEFORE BREAKFAST AND TAKE 1 CAPSULE BY MOUTH BEFORE DINNER 07/20/18  Yes Milus Banister, MD  oxyCODONE (ROXICODONE) 5 MG immediate release tablet Take 1 tablet (5 mg total) by mouth every 6 (six) hours as  needed for severe pain. 10/28/18  Yes Brunetta Jeans, PA-C  OXYGEN Inhale 2 L into the lungs at bedtime.    Yes [provider]  potassium chloride (K-DUR) 10 MEQ tablet TAKE 1 TABLET BY MOUTH AT BEDTIME 07/21/18  Yes Brunetta Jeans, PA-C  sertraline (ZOLOFT) 100 MG tablet TAKE TWO TABLETS BY MOUTH AT BEDTIME 05/20/18  Yes Brunetta Jeans, PA-C  SYMBICORT 160-4.5 MCG/ACT inhaler INHALE TWO PUFFS BY MOUTH TWICE DAILY 10/15/18  Yes Tanda Rockers, MD  triamterene-hydrochlorothiazide (PXTGGYI-94) 37.5-25 MG tablet TAKE 1 TABLET BY MOUTH EVERY DAY 06/18/18  Yes Brunetta Jeans, PA-C      Allergies  Allergen Reactions  . Nsaids Diarrhea  . Aleve [Naproxen Sodium] Other (See Comments)    Headache   . Codeine Nausea Only and Other (See Comments)    GI upset  . Penicillins Nausea Only and Other (See Comments)    GI upset Has patient had a PCN reaction causing immediate rash, facial/tongue/throat swelling, SOB or lightheadedness with hypotension: No Has patient had a PCN reaction causing severe rash involving mucus membranes or skin necrosis: No Has patient had a PCN reaction that required hospitalization No Has patient had a PCN reaction occurring within the last 10 years: No If all of the above answers are "NO", then may proceed with Cephalosporin use.   . Sulfonamide Derivatives Hives    ROS:  Out of a complete 14 system review of symptoms, the patient complains only of the following symptoms, and all other reviewed systems are negative.  Tremors Joint pains Walking difficulty Low back pain  Blood pressure 126/70, pulse 75, temperature 98 F (36.7 C), temperature source Temporal, height 5\' 2"  (1.575 m), weight 264 lb (119.7 kg).  Physical Exam  General: The patient is alert and cooperative at the time of the examination.  The patient is markedly obese.  Eyes: Pupils are equal, round, and reactive to light. Discs are flat bilaterally.  Neck: The neck is supple,  no carotid bruits are noted.  Respiratory: The respiratory  examination is clear.  Cardiovascular: The cardiovascular examination reveals a regular rate and rhythm, no obvious murmurs or rubs are noted.  Skin: Extremities are with 1-2+ edema below the knees bilaterally.  Neurologic Exam  Mental status: The patient is alert and oriented x 3 at the time of the examination. The patient has apparent normal recent and remote memory, with an apparently normal attention span and concentration ability.  Cranial nerves: Facial symmetry is present. There is good sensation of the face to pinprick and soft touch bilaterally. The strength of the facial muscles and the muscles to head turning and shoulder shrug are normal bilaterally. Speech is well enunciated, no aphasia or dysarthria is noted. Extraocular movements are full. Visual fields are full. The tongue is midline, and the patient has symmetric elevation of the soft palate. No obvious hearing deficits are noted.  Motor: The motor testing reveals 5 over 5 strength of all 4 extremities. Good symmetric motor tone is noted throughout.  Sensory: Sensory testing is intact to pinprick, soft touch, vibration sensation, and position sense on all 4 extremities. No evidence of extinction is noted.  Coordination: Cerebellar testing reveals good finger-nose-finger and heel-to-shin bilaterally.  No asterixis is seen.  No tremors noted during the examination.  Gait and station: Gait is wide-based, the patient normally uses a walker.  Tandem gait was not attempted.  Romberg is negative but is slightly unsteady.  Reflexes: Deep tendon reflexes are symmetric and normal bilaterally.  The ankle jerk reflexes are well-maintained bilaterally.  Toes are downgoing bilaterally.   Assessment/Plan:  1.  Episodic tremors of arms  2.  Episodic collapse of left leg  The patient has a lot of arthritic issues affecting both legs, this could be a factor with the walking, but  the patient is on gabapentin which can result in asterixis involving the lower extremities.  The patient also has breathing issues, she has to stop and rest frequently secondary to shortness of breath, desaturation of her oxygen level could result with the episodic falls.  The patient will cut back on the gabapentin taking 300 mg 3 times daily for a couple weeks to determine whether this has any impact on her falls.  The episodes of intermittent tremors may be associated with bouts of anxiety or being upset, the patient is under a lot of stress at this time.  She will follow-up here in 4 months.  Jill Alexanders MD 11/05/2018 2:01 PM  [facility] [address] [place], [place] [address]  Phone [phone] Fax [phone]

## 2018-11-06 ENCOUNTER — Ambulatory Visit: Payer: Managed Care, Other (non HMO)

## 2018-11-10 ENCOUNTER — Ambulatory Visit (INDEPENDENT_AMBULATORY_CARE_PROVIDER_SITE_OTHER): Payer: Medicare Other | Admitting: Physician Assistant

## 2018-11-10 ENCOUNTER — Encounter: Payer: Self-pay | Admitting: Physician Assistant

## 2018-11-10 ENCOUNTER — Other Ambulatory Visit: Payer: Self-pay

## 2018-11-10 VITALS — BP 140/70 | HR 84 | Temp 98.0°F | Resp 16 | Ht 62.0 in | Wt 263.0 lb

## 2018-11-10 DIAGNOSIS — E039 Hypothyroidism, unspecified: Secondary | ICD-10-CM | POA: Diagnosis not present

## 2018-11-10 DIAGNOSIS — F419 Anxiety disorder, unspecified: Secondary | ICD-10-CM

## 2018-11-10 DIAGNOSIS — G894 Chronic pain syndrome: Secondary | ICD-10-CM | POA: Diagnosis not present

## 2018-11-10 DIAGNOSIS — F329 Major depressive disorder, single episode, unspecified: Secondary | ICD-10-CM

## 2018-11-10 DIAGNOSIS — F32A Depression, unspecified: Secondary | ICD-10-CM

## 2018-11-10 MED ORDER — OXYCODONE-ACETAMINOPHEN 10-325 MG PO TABS: 1.0000 | ORAL_TABLET | Freq: Three times a day (TID) | ORAL | 0 refills | Status: AC | PRN

## 2018-11-10 MED ORDER — DULOXETINE HCL 60 MG PO CPEP: 60.0000 mg | ORAL_CAPSULE | Freq: Every day | ORAL | 1 refills | Status: DC

## 2018-11-10 NOTE — Progress Notes (Signed)
Patient presents to clinic today c/o increased anxiety over the past few months. Daughter died recently, unexpectedly and they are awaiting the autopsy results. Her son who has "a lot of issues" recently had to move back in with them. Her husband is currently out of work as well. All of these things, coupled with her chronic pain are causing significant increase in her anxiety and depression. Notes feeling jittery. Her Xanax does help. She is also taking her Zoloft daily but not sure how well it is helping currently. Denies SI/HI.   In terms of pain this seems to very. Notes averaging 8-9/10 without medication. With medication can average anywhere from a 3-7, noting most pain relief within first 2 hours of taking medication. Then feels like it is wearing off very quickly. Denies new aspects of pain.  Patient also due for repeat check of calcium. Has been borderline low on the past few checks in office.   Past Medical History:  Diagnosis Date  . Allergic rhinitis   . Anemia   . Anxiety   . Chicken pox   . Chronic back pain   . COPD (chronic obstructive pulmonary disease) ([place])   . Depression   . Essential hypertension   . GERD (gastroesophageal reflux disease)   . Headache    migraines  . History of gastritis    EGD 2015  . History of home oxygen therapy    2 liters at hs last 6 months  . Hyperlipidemia   . Hypothyroidism   . Migraines   . Osteoarthritis    oa  . Scoliosis     Current Outpatient Medications on File Prior to Visit  Medication Sig Dispense Refill  . albuterol (PROAIR HFA) 108 (90 Base) MCG/ACT inhaler 2 puffs every 4 hours as needed only  if your can't catch your breath 6.7 g 0  . albuterol (VENTOLIN HFA) 108 (90 Base) MCG/ACT inhaler Inhale 2 puffs into the lungs every 4 (four) hours as needed for wheezing or shortness of breath. 8 g 1  . ALPRAZolam (XANAX) 1 MG tablet Take 1 tablet (1 mg total) by mouth 3 (three) times daily as needed. for anxiety 90 tablet 0   . atorvastatin (LIPITOR) 20 MG tablet TAKE 1 TABLET BY MOUTH AT BEDTIME 90 tablet 1  . famotidine (PEPCID) 20 MG tablet Take 1 tablet (20 mg total) by mouth 2 (two) times daily. 60 tablet 11  . fluticasone (FLONASE) 50 MCG/ACT nasal spray Place 2 sprays into both nostrils daily.    Marland Kitchen gabapentin (NEURONTIN) 300 MG capsule Take 600 mg by mouth 3 times daily    . INCRUSE ELLIPTA 62.5 MCG/INH AEPB INHALE 1 PUFF INTO THE LUNGS DAILY 30 each 5  . levothyroxine (SYNTHROID) 75 MCG tablet TAKE 1 TABLET BY MOUTH DAILY BEFORE BREAKFAST 90 tablet 1  . loratadine (CLARITIN) 10 MG tablet Take 10 mg by mouth daily.    . meloxicam (MOBIC) 15 MG tablet Take 15 mg by mouth daily.    Marland Kitchen nystatin (MYCOSTATIN/NYSTOP) powder Apply topically 2 (two) times daily. 15 g 0  . omeprazole (PRILOSEC) 40 MG capsule TAKE 1 CAPSULE BY MOUTH BEFORE BREAKFAST AND TAKE 1 CAPSULE BY MOUTH BEFORE DINNER 60 capsule 3  . oxyCODONE (ROXICODONE) 5 MG immediate release tablet Take 1 tablet (5 mg total) by mouth every 6 (six) hours as needed for severe pain. 120 tablet 0  . OXYGEN Inhale 2 L into the lungs at bedtime.     Marland Kitchen  potassium chloride (K-DUR) 10 MEQ tablet TAKE 1 TABLET BY MOUTH AT BEDTIME 90 tablet 1  . sertraline (ZOLOFT) 100 MG tablet TAKE TWO TABLETS BY MOUTH AT BEDTIME 180 tablet 1  . SYMBICORT 160-4.5 MCG/ACT inhaler INHALE TWO PUFFS BY MOUTH TWICE DAILY 10.2 g 5  . triamterene-hydrochlorothiazide (MAXZIDE-25) 37.5-25 MG tablet TAKE 1 TABLET BY MOUTH EVERY DAY 90 tablet 1   No current facility-administered medications on file prior to visit.     Allergies  Allergen Reactions  . Nsaids Diarrhea  . Aleve [Naproxen Sodium] Other (See Comments)    Headache   . Codeine Nausea Only and Other (See Comments)    GI upset  . Penicillins Nausea Only and Other (See Comments)    GI upset Has patient had a PCN reaction causing immediate rash, facial/tongue/throat swelling, SOB or lightheadedness with hypotension: No Has patient  had a PCN reaction causing severe rash involving mucus membranes or skin necrosis: No Has patient had a PCN reaction that required hospitalization No Has patient had a PCN reaction occurring within the last 10 years: No If all of the above answers are "NO", then may proceed with Cephalosporin use.   . Sulfonamide Derivatives Hives    Family History  Problem Relation Age of Onset  . COPD Mother   . Heart disease Mother   . Lung disease Father        Asbestosis  . Heart attack Father   . Heart disease Father   . Cerebral aneurysm Brother   . Aneurysm Brother        Brain  . Epilepsy Son   . Arthritis Maternal Grandmother   . Heart disease Maternal Grandmother   . Asthma Maternal Grandfather   . Cancer Maternal Grandfather   . Arthritis Paternal Grandmother   . Heart disease Paternal Grandmother   . Stroke Paternal Grandmother   . Early death Paternal Grandfather   . Heart disease Paternal Grandfather     Social History   Socioeconomic History  . Marital status: Married    Spouse name: Not on file  . Number of children: Not on file  . Years of education: Not on file  . Highest education level: Not on file  Occupational History  . Not on file  Social Needs  . Financial resource strain: Not on file  . Food insecurity    Worry: Not on file    Inability: Not on file  . Transportation needs    Medical: Not on file    Non-medical: Not on file  Tobacco Use  . Smoking status: Current Every Day Smoker    Packs/day: 1.50    Years: 46.00    Pack years: 69.00    Types: Cigarettes  . Smokeless tobacco: Never Used  Substance and Sexual Activity  . Alcohol use: No  . Drug use: No  . Sexual activity: Yes  Lifestyle  . Physical activity    Days per week: Not on file    Minutes per session: Not on file  . Stress: Not on file  Relationships  . Social Herbalist on phone: Not on file    Gets together: Not on file    Attends religious service: Not on file     Active member of club or organization: Not on file    Attends meetings of clubs or organizations: Not on file    Relationship status: Not on file  Other Topics Concern  . Not on file  Social  History Narrative   Right handed    Caffeine~ 2 cups per day    Lives at home with husband    Review of Systems - See HPI.  All other ROS are negative.  Wt 263 lb (119.3 kg)   BMI 48.10 kg/m   Physical Exam Vitals signs reviewed.  Constitutional:      Appearance: Normal appearance.  HENT:     Head: Normocephalic and atraumatic.  Neck:     Musculoskeletal: Neck supple.  Cardiovascular:     Rate and Rhythm: Normal rate and regular rhythm.     Pulses: Normal pulses.     Heart sounds: Normal heart sounds.  Pulmonary:     Effort: Pulmonary effort is normal.     Breath sounds: Normal breath sounds.  Neurological:     General: No focal deficit present.     Mental Status: She is alert and oriented to person, place, and time.  Psychiatric:        Mood and Affect: Mood normal.     Recent Results (from the past 2160 hour(s))  Pain Mgmt, Profile 8 w/Conf, U     Status: None   Collection Time: 08/31/18  2:11 PM  Result Value Ref Range   Creatinine 247.0 mg/dL   pH 6.0 4.5 - 9.0   Oxidant NEGATIVE mcg/mL   Amphetamines NEGATIVE ng/mL   medMATCH Amphetamines CONSISTENT    Benzodiazepines POSITIVE ng/mL   Alphahydroxyalprazolam 1,044 ng/mL    Comment: This test was developed and its analytical performance characteristics have been determined by [employer]. It has not been cleared or approved by the FDA. This assay has been validated pursuant to the CLIA regulations and is used for clinical purposes.    medMATCH aOH alprazolam INCONSISTENT    Alphahydroxymidazolam NEGATIVE ng/mL    Comment: This test was developed and its analytical performance characteristics have been determined by [employer]. It has not been cleared or approved by the FDA. This assay has been validated  pursuant to the CLIA regulations and is used for clinical purposes.    medMATCH aOH midazolam CONSISTENT    Alphahydroxytriazolam NEGATIVE ng/mL    Comment: This test was developed and its analytical performance characteristics have been determined by [employer]. It has not been cleared or approved by the FDA. This assay has been validated pursuant to the CLIA regulations and is used for clinical purposes.    medMATCH aOH triazolam CONSISTENT    Aminoclonazepam NEGATIVE ng/mL    Comment: This test was developed and its analytical performance characteristics have been determined by [employer]. It has not been cleared or approved by the FDA. This assay has been validated pursuant to the CLIA regulations and is used for clinical purposes.    medMATCH Aminoclonazepam CONSISTENT    Hydroxyethylflurazepam NEGATIVE ng/mL    Comment: This test was developed and its analytical performance characteristics have been determined by [employer]. It has not been cleared or approved by the FDA. This assay has been validated pursuant to the CLIA regulations and is used for clinical purposes.    medMATCH OH,Et flurazepam CONSISTENT    Lorazepam NEGATIVE ng/mL    Comment: This test was developed and its analytical performance characteristics have been determined by [employer]. It has not been cleared or approved by the FDA. This assay has been validated pursuant to the CLIA regulations and is used for clinical purposes.    medMATCH Lorazepam CONSISTENT    Nordiazepam NEGATIVE ng/mL  Comment: This test was developed and its analytical performance characteristics have been determined by [employer]. It has not been cleared or approved by the FDA. This assay has been validated pursuant to the CLIA regulations and is used for clinical purposes.    medMATCH Nordiazepam CONSISTENT    Oxazepam NEGATIVE ng/mL    Comment: This test was developed and its analytical  performance characteristics have been determined by [employer]. It has not been cleared or approved by the FDA. This assay has been validated pursuant to the CLIA regulations and is used for clinical purposes.    medMATCH Oxazepam CONSISTENT    Temazepam NEGATIVE ng/mL    Comment: This test was developed and its analytical performance characteristics have been determined by [employer]. It has not been cleared or approved by the FDA. This assay has been validated pursuant to the CLIA regulations and is used for clinical purposes.    medMATCH Temazepam CONSISTENT    Marijuana Metabolite INTERFERENCE ng/mL    Comment: THE ABOVE TEST WE PERFORMED, BUT THE TEST RESULTS COULD NOT BE INTERPRETED DUE TO NON-SPECIFIC INTERFERENCE.    Cocaine Metabolite NEGATIVE ng/mL   [facility] Cocaine Metab CONSISTENT    Opiates POSITIVE ng/mL   Codeine NEGATIVE ng/mL    Comment: This test was developed and its analytical performance characteristics have been determined by [employer]. It has not been cleared or approved by the FDA. This assay has been validated pursuant to the CLIA regulations and is used for clinical purposes.    medMATCH Codeine CONSISTENT    Hydrocodone 4,119 ng/mL    Comment: This test was developed and its analytical performance characteristics have been determined by [employer]. It has not been cleared or approved by the FDA. This assay has been validated pursuant to the CLIA regulations and is used for clinical purposes.    medMATCH Hydrocodone INCONSISTENT    Hydromorphone NEGATIVE ng/mL    Comment: This test was developed and its analytical performance characteristics have been determined by [employer]. It has not been cleared or approved by the FDA. This assay has been validated pursuant to the CLIA regulations and is used for clinical purposes.    medMATCH Hydromorphone CONSISTENT    Morphine NEGATIVE ng/mL    Comment: This test  was developed and its analytical performance characteristics have been determined by [employer]. It has not been cleared or approved by the FDA. This assay has been validated pursuant to the CLIA regulations and is used for clinical purposes.    medMATCH Morphine CONSISTENT    Norhydrocodone 6,020 ng/mL    Comment: This test was developed and its analytical performance characteristics have been determined by [employer]. It has not been cleared or approved by the FDA. This assay has been validated pursuant to the CLIA regulations and is used for clinical purposes.    medMATCH Norhydrocodone INCONSISTENT     Comment: Norhydrocodone is a metabolite of Hydrocodone.   Oxycodone INTERFERENCE ng/mL    Comment: THE ABOVE TEST WE PERFORMED, BUT THE TEST RESULTS COULD NOT BE INTERPRETED DUE TO NON-SPECIFIC INTERFERENCE.    Buprenorphine, Urine NEGATIVE ng/mL   medMATCH Buprenorphine CONSISTENT    MDMA NEGATIVE ng/mL   [facility] MDMA CONSISTENT    Alcohol Metabolites INTERFERENCE <500 ng/mL    Comment: THE ABOVE TEST WE PERFORMED, BUT THE TEST RESULTS COULD NOT BE INTERPRETED DUE TO NON-SPECIFIC INTERFERENCE.    6 Acetylmorphine NEGATIVE ng/mL   medMATCH 6 Acetylmorphine CONSISTENT  Comment: _0  . Note 1 This drug testing is for medical treatment only.   Analysis was performed as non-forensic testing and  these results should be used only by healthcare  providers to render diagnosis or treatment, or to  monitor progress of medical conditions. Hazel Sams comments are:  - present when drug test results may be the result of     metabolism of one or more drugs or when results are     inconsistent with prescribed medication(s) listed.  - may be blank when drug results are consistent with     prescribed medication(s) listed. . For assistance with interpreting these drug results,  please contact a [employer]  Toxicology  Specialist: [phone] [place] [phone]), M-F,  8am-6pm EST.   Comp Met (CMET)     Status: Abnormal   Collection Time: 08/31/18  2:11 PM  Result Value Ref Range   Sodium 139 135 - 145 mEq/L   Potassium 4.2 3.5 - 5.1 mEq/L   Chloride 102 96 - 112 mEq/L   CO2 25 19 - 32 mEq/L   Glucose, Bld 85 70 - 99 mg/dL   BUN 11 6 - 23 mg/dL   Creatinine, Ser 0.89 0.40 - 1.20 mg/dL   Total Bilirubin 0.4 0.2 - 1.2 mg/dL   Alkaline Phosphatase 74 39 - 117 U/L   AST 50 (H) 0 - 37 U/L   ALT 42 (H) 0 - 35 U/L   Total Protein 6.8 6.0 - 8.3 g/dL   Albumin 3.7 3.5 - 5.2 g/dL   Calcium 9.2 8.4 - 10.5 mg/dL   GFR 64.14 >60.00 mL/min  Lipid Profile     Status: Abnormal   Collection Time: 08/31/18  2:11 PM  Result Value Ref Range   Cholesterol 132 0 - 200 mg/dL    Comment: ATP III Classification       Desirable:  < 200 mg/dL               Borderline High:  200 - 239 mg/dL          High:  > = 240 mg/dL   Triglycerides 228.0 (H) 0.0 - 149.0 mg/dL    Comment: Normal:  <150 mg/dLBorderline High:  150 - 199 mg/dL   HDL 32.70 (L) >39.00 mg/dL   VLDL 45.6 (H) 0.0 - 40.0 mg/dL   Total CHOL/HDL Ratio 4     Comment:                Men          Women1/2 Average Risk     3.4          3.3Average Risk          5.0          4.42X Average Risk          9.6          7.13X Average Risk          15.0          11.0                       NonHDL 99.32     Comment: NOTE:  Non-HDL goal should be 30 mg/dL higher than patient's LDL goal (i.e. LDL goal of < 70 mg/dL, would have non-HDL goal of < 100 mg/dL)  LDL cholesterol, direct     Status: None  Collection Time: 08/31/18  2:11 PM  Result Value Ref Range   Direct LDL 72.0 mg/dL    Comment: Optimal:  <100 mg/dLNear or Above Optimal:  100-129 mg/dLBorderline High:  130-159 mg/dLHigh:  160-189 mg/dLVery High:  >190 mg/dL  Lipid Profile     Status: Abnormal   Collection Time: 10/02/18  1:59 PM  Result Value Ref Range   Cholesterol 126 0 - 200 mg/dL    Comment:  ATP III Classification       Desirable:  < 200 mg/dL               Borderline High:  200 - 239 mg/dL          High:  > = 240 mg/dL   Triglycerides 155.0 (H) 0.0 - 149.0 mg/dL    Comment: Normal:  <150 mg/dLBorderline High:  150 - 199 mg/dL   HDL 30.90 (L) >39.00 mg/dL   VLDL 31.0 0.0 - 40.0 mg/dL   LDL Cholesterol 64 0 - 99 mg/dL   Total CHOL/HDL Ratio 4     Comment:                Men          Women1/2 Average Risk     3.4          3.3Average Risk          5.0          4.42X Average Risk          9.6          7.13X Average Risk          15.0          11.0                       NonHDL 94.99     Comment: NOTE:  Non-HDL goal should be 30 mg/dL higher than patient's LDL goal (i.e. LDL goal of < 70 mg/dL, would have non-HDL goal of < 100 mg/dL)  Comp Met (CMET)     Status: Abnormal   Collection Time: 10/02/18  1:59 PM  Result Value Ref Range   Sodium 138 135 - 145 mEq/L   Potassium 3.9 3.5 - 5.1 mEq/L   Chloride 99 96 - 112 mEq/L   CO2 29 19 - 32 mEq/L   Glucose, Bld 93 70 - 99 mg/dL   BUN 15 6 - 23 mg/dL   Creatinine, Ser 1.02 0.40 - 1.20 mg/dL   Total Bilirubin 0.3 0.2 - 1.2 mg/dL   Alkaline Phosphatase 62 39 - 117 U/L   AST 47 (H) 0 - 37 U/L   ALT 29 0 - 35 U/L   Total Protein 6.7 6.0 - 8.3 g/dL   Albumin 3.5 3.5 - 5.2 g/dL   Calcium 8.3 (L) 8.4 - 10.5 mg/dL   GFR 54.79 (L) >60.00 mL/min  Lipid Profile     Status: Abnormal   Collection Time: 11/04/18  3:22 PM  Result Value Ref Range   Cholesterol 135 0 - 200 mg/dL    Comment: ATP III Classification       Desirable:  < 200 mg/dL               Borderline High:  200 - 239 mg/dL          High:  > = 240 mg/dL   Triglycerides 194.0 (H) 0.0 - 149.0 mg/dL    Comment: Normal:  <150 mg/dLBorderline  High:  150 - 199 mg/dL   HDL 34.80 (L) >39.00 mg/dL   VLDL 38.8 0.0 - 40.0 mg/dL   LDL Cholesterol 61 0 - 99 mg/dL   Total CHOL/HDL Ratio 4     Comment:                Men          Women1/2 Average Risk     3.4          3.3Average Risk           5.0          4.42X Average Risk          9.6          7.13X Average Risk          15.0          11.0                       NonHDL 100.24     Comment: NOTE:  Non-HDL goal should be 30 mg/dL higher than patient's LDL goal (i.e. LDL goal of < 70 mg/dL, would have non-HDL goal of < 100 mg/dL)  Hepatic function panel     Status: None   Collection Time: 11/04/18  3:22 PM  Result Value Ref Range   Total Bilirubin 0.4 0.2 - 1.2 mg/dL   Bilirubin, Direct 0.1 0.0 - 0.3 mg/dL   Alkaline Phosphatase 58 39 - 117 U/L   AST 37 0 - 37 U/L   ALT 25 0 - 35 U/L   Total Protein 6.7 6.0 - 8.3 g/dL   Albumin 3.8 3.5 - 5.2 g/dL  Comp Met (CMET)     Status: Abnormal   Collection Time: 11/04/18  3:22 PM  Result Value Ref Range   Sodium 139 135 - 145 mEq/L   Potassium 4.2 3.5 - 5.1 mEq/L   Chloride 101 96 - 112 mEq/L   CO2 30 19 - 32 mEq/L   Glucose, Bld 87 70 - 99 mg/dL   BUN 14 6 - 23 mg/dL   Creatinine, Ser 0.97 0.40 - 1.20 mg/dL   Total Bilirubin 0.4 0.2 - 1.2 mg/dL   Alkaline Phosphatase 58 39 - 117 U/L   AST 37 0 - 37 U/L   ALT 25 0 - 35 U/L   Total Protein 6.7 6.0 - 8.3 g/dL   Albumin 3.8 3.5 - 5.2 g/dL   Calcium 8.3 (L) 8.4 - 10.5 mg/dL   GFR 58.05 (L) >60.00 mL/min    Assessment/Plan: 1. Anxiety and depression Deteriorated. With her chronic pain will switch her Sertraline for an equivalent dose of Cymbalta. Continue Xanax and other medications as directed. Counseling recommended. Handout given. Will follow-up 3-4 weeks for reassessment.   2. Hypocalcemia Will check her TSH, PTH and Calcium today to further assess. - PTH, Intact and Calcium  3. Chronic pain syndrome Will switch SSRI to Cymbalta which will also hopefully help with pain relief. Will switch her Oxycodone to Percocet 10-325 mg. Follow-up 3-4 weeks.   4. Hypothyroidism, unspecified type - TSH   [name], PA-C

## 2018-11-10 NOTE — Patient Instructions (Signed)
Please keep well-hydrated and try to get plenty of rest.  I want you to stop the Sertraline (Zoloft) and start the Duloxetine (Cymbalta) daily as directed. Continue Xanax as directed, if needed. Continue Gabapentin as directed.  I am stopping the Oxycodone and switching to Percocet -- starting with a week trial so we can make sure it is doing what it needs to for pain levels.  Please go to the lab today for blood work.  I will call you with your results. We will alter treatment regimen(s) if indicated by your results.   Please use handouts given to schedule appointment with counseling.  Follow-up with me in 3-4 weeks.

## 2018-11-11 LAB — PTH, INTACT AND CALCIUM: Calcium: 8.5 mg/dL — ABNORMAL LOW (ref 8.6–10.4)

## 2018-11-11 LAB — TSH: TSH: 4.4 u[IU]/mL (ref 0.35–4.50)

## 2018-11-15 ENCOUNTER — Other Ambulatory Visit: Payer: Self-pay | Admitting: Physician Assistant

## 2018-11-16 ENCOUNTER — Telehealth: Payer: Self-pay | Admitting: Physician Assistant

## 2018-11-16 ENCOUNTER — Telehealth: Payer: Self-pay

## 2018-11-16 MED ORDER — OXYCODONE-ACETAMINOPHEN 10-325 MG PO TABS: 1.0000 | ORAL_TABLET | Freq: Four times a day (QID) | ORAL | 0 refills | Status: DC | PRN

## 2018-11-16 NOTE — Telephone Encounter (Signed)
Duplicate encounter

## 2018-11-16 NOTE — Telephone Encounter (Signed)
Medication has been sent.  

## 2018-11-16 NOTE — Telephone Encounter (Signed)
Patient is aware that script has been sent to pharmacy.

## 2018-11-16 NOTE — Telephone Encounter (Signed)
Indication for chronic opioid: Severe OA multiple sites, Lumbar Spinal Stenosis, Chronic Pain syndrome Medication and dose: Oxycodone-apap 10/325 # pills per month: 15 Last UDS date: 08/31/18 Opioid Treatment Agreement signed (Y/N): Yes, 08/21/2017 Opioid Treatment Agreement last reviewed with patient:   [facility] reviewed this encounter (include red flags):

## 2018-11-16 NOTE — Addendum Note (Signed)
Addended by: Brunetta Jeans on: 11/16/2018 04:03 PM   Modules accepted: Orders

## 2018-11-16 NOTE — Telephone Encounter (Signed)
Patient called back in wanting to know if her script had been sent in. Informed patient that refill approval has been sent to [facility] and that I would route message. Please advise

## 2018-11-16 NOTE — Telephone Encounter (Signed)
Patient called in and said that the oxyCODONE-Acetaminophen 10-325 MG Is helping her with her pain and wants to know if she can be prescribed more. Please advise.

## 2018-11-17 ENCOUNTER — Telehealth: Payer: Self-pay

## 2018-11-17 NOTE — Telephone Encounter (Signed)
Patient called in stating that when she went to pick up her prescription yesterday afternoon, insurance was not covering cost. She spoke with a rep from [facility], requesting that [place] send in a prescription dated from yesterday with the prior auth paperwork that we received this morning. This will need to be done every 6 months. She did receive a 7 day supply of medication until this is straightened out.

## 2018-11-17 NOTE — Telephone Encounter (Signed)
PA submitted        Awaiting response from insurance

## 2018-11-17 NOTE — Telephone Encounter (Signed)
Received PA forms. Will work on completing these ASAP via CoverMyMeds

## 2018-11-19 NOTE — Telephone Encounter (Signed)
Received notation for insurance after completion of PA requested by them that the medication does not need a PA. Please send copy of this report to patient pharmacy and let patient know. She should be able to pick up the rest of her script when due.

## 2018-11-19 NOTE — Telephone Encounter (Signed)
Spoke with patient about her insurance covering the Oxycodone medication. Patient advised she has changed her insurance to [facility]. Will have the girls update her chart with current insurance information. She was able to get the rest of her medication filled with no problem

## 2018-11-19 NOTE — Telephone Encounter (Signed)
Received questionnaire from Insurance wanting further responses for PA. Completed and faxed in to number given. Awaiting response.

## 2018-11-23 ENCOUNTER — Other Ambulatory Visit: Payer: Self-pay | Admitting: Physician Assistant

## 2018-11-23 NOTE — Telephone Encounter (Signed)
Last refill: 9.25.20 #90, 0 Last OV: 10.13.20 dx. Anxiety and depression

## 2018-11-25 ENCOUNTER — Other Ambulatory Visit: Payer: Self-pay | Admitting: Gastroenterology

## 2018-11-25 ENCOUNTER — Other Ambulatory Visit: Payer: Self-pay | Admitting: Physician Assistant

## 2018-12-04 ENCOUNTER — Ambulatory Visit: Payer: Managed Care, Other (non HMO) | Admitting: Adult Health

## 2018-12-07 ENCOUNTER — Ambulatory Visit: Payer: Managed Care, Other (non HMO) | Admitting: Adult Health

## 2018-12-08 ENCOUNTER — Ambulatory Visit: Payer: Managed Care, Other (non HMO) | Admitting: Physician Assistant

## 2018-12-14 ENCOUNTER — Ambulatory Visit: Payer: Medicare Other | Admitting: Adult Health

## 2018-12-15 ENCOUNTER — Other Ambulatory Visit: Payer: Self-pay | Admitting: Physician Assistant

## 2018-12-15 ENCOUNTER — Other Ambulatory Visit: Payer: Self-pay | Admitting: Adult Health

## 2018-12-16 ENCOUNTER — Ambulatory Visit (INDEPENDENT_AMBULATORY_CARE_PROVIDER_SITE_OTHER): Payer: Medicare Other | Admitting: Physician Assistant

## 2018-12-16 ENCOUNTER — Other Ambulatory Visit: Payer: Self-pay

## 2018-12-16 ENCOUNTER — Ambulatory Visit (INDEPENDENT_AMBULATORY_CARE_PROVIDER_SITE_OTHER): Payer: Medicare Other

## 2018-12-16 VITALS — BP 148/70 | Temp 96.1°F | Ht 62.0 in | Wt 267.2 lb

## 2018-12-16 DIAGNOSIS — G894 Chronic pain syndrome: Secondary | ICD-10-CM

## 2018-12-16 DIAGNOSIS — F329 Major depressive disorder, single episode, unspecified: Secondary | ICD-10-CM

## 2018-12-16 DIAGNOSIS — Z0000 Encounter for general adult medical examination without abnormal findings: Secondary | ICD-10-CM | POA: Diagnosis not present

## 2018-12-16 DIAGNOSIS — F32A Depression, unspecified: Secondary | ICD-10-CM

## 2018-12-16 DIAGNOSIS — F419 Anxiety disorder, unspecified: Secondary | ICD-10-CM

## 2018-12-16 MED ORDER — OXYCODONE-ACETAMINOPHEN 10-325 MG PO TABS: 1.0000 | ORAL_TABLET | Freq: Four times a day (QID) | ORAL | 0 refills | Status: DC | PRN

## 2018-12-16 MED ORDER — DULOXETINE HCL 20 MG PO CPEP: 20.0000 mg | ORAL_CAPSULE | Freq: Every day | ORAL | 3 refills | Status: DC

## 2018-12-16 NOTE — Patient Instructions (Addendum)
Please go to the lab today for blood work.  I will call you with your results. We will alter treatment regimen(s) if indicated by your results.   Please start the increased dose of the Cymbalta (total of 80 mg daily).  Continue other medications as directed.  You can take the Alprazolam up to QID if needed.  Follow-up with me in 2 weeks via video or phone visit. Sooner if needed.   Call Hospice to see about Grief Counseling [phone]  Have your son call the [facility] so he can get some medical assistance when getting here from [facility].

## 2018-12-16 NOTE — Progress Notes (Signed)
RN [place] note reviewed.  [name], PA-C

## 2018-12-16 NOTE — Progress Notes (Signed)
Subjective:   [name] is a 63 y.o. female who presents for an Initial Medicare Annual Wellness Visit.  Review of Systems     Cardiac Risk Factors include: hypertension;obesity (BMI >30kg/m2);sedentary lifestyle;smoking/ tobacco exposure    Objective:    Today's Vitals   12/16/18 1436  BP: (!) 148/70  Temp: (!) 96.1 F (35.6 C)  Weight: 267 lb 3.2 oz (121.2 kg)  Height: 5\' 2"  (1.575 m)   Body mass index is 48.87 kg/m.  Advanced Directives 12/16/2018 07/24/2017 09/02/2016 08/29/2016 07/23/2016 05/03/2016 04/17/2016  Does Patient Have a Medical Advance Directive? Yes Yes Yes Yes No Yes No  Type of Paramedic of [place];Living will [place];Out of facility DNR (pink MOST or yellow form) [place];Living will;Out of facility DNR (pink MOST or yellow form) [place];Living will - Living will;Healthcare Power of Attorney Living will;Healthcare Power of Attorney  Does patient want to make changes to medical advance directive? No - Patient declined - No - Patient declined No - Patient declined - - -  Copy of [place] in Chart? Yes - validated most recent copy scanned in chart (See row information) Yes No - copy requested No - copy requested - No - copy requested No - copy requested  Would patient like information on creating a medical advance directive? - - - - - - No - Patient declined  Pre-existing out of facility DNR order (yellow form or pink MOST form) - - (No Data) - - - -    Current Medications (verified) Outpatient Encounter Medications as of 12/16/2018  Medication Sig  . albuterol (PROAIR HFA) 108 (90 Base) MCG/ACT inhaler 2 puffs every 4 hours as needed only  if your can't catch your breath  . albuterol (VENTOLIN HFA) 108 (90 Base) MCG/ACT inhaler Inhale 2 puffs into the lungs every 4 (four) hours as needed for wheezing or shortness of breath.  . ALPRAZolam (XANAX) 1 MG  tablet TAKE 1 TABLET BY MOUTH THREE TIMES DAILY AS NEEDED FOR ANXIETY  . atorvastatin (LIPITOR) 20 MG tablet TAKE 1 TABLET BY MOUTH AT BEDTIME  . DULoxetine (CYMBALTA) 60 MG capsule Take 1 capsule (60 mg total) by mouth daily.  . famotidine (PEPCID) 20 MG tablet Take 1 tablet (20 mg total) by mouth 2 (two) times daily.  . fluticasone (FLONASE) 50 MCG/ACT nasal spray Place 2 sprays into both nostrils daily.  Marland Kitchen gabapentin (NEURONTIN) 300 MG capsule Take 600 mg by mouth 3 times daily  . INCRUSE ELLIPTA 62.5 MCG/INH AEPB INHALE 1 PUFF INTO THE LUNGS DAILY  . levothyroxine (SYNTHROID) 75 MCG tablet TAKE 1 TABLET BY MOUTH DAILY BEFORE BREAKFAST  . loratadine (CLARITIN) 10 MG tablet Take 10 mg by mouth daily.  . meloxicam (MOBIC) 15 MG tablet Take 15 mg by mouth daily.  Marland Kitchen nystatin (MYCOSTATIN/NYSTOP) powder Apply topically 2 (two) times daily.  Marland Kitchen omeprazole (PRILOSEC) 40 MG capsule TAKE 1 CAPSULE BY MOUTH BEFORE BREAKFAST AND TAKE 1 CAPSULE BY MOUTH BEFORE DINNER  . oxyCODONE-acetaminophen (PERCOCET) 10-325 MG tablet Take 1 tablet by mouth every 6 (six) hours as needed for pain.  . OXYGEN Inhale 2 L into the lungs at bedtime.   . potassium chloride (K-DUR) 10 MEQ tablet TAKE 1 TABLET BY MOUTH AT BEDTIME  . SYMBICORT 160-4.5 MCG/ACT inhaler INHALE TWO PUFFS BY MOUTH TWICE DAILY  . triamterene-hydrochlorothiazide (MAXZIDE-25) 37.5-25 MG tablet TAKE 1 TABLET BY MOUTH EVERY DAY  No facility-administered encounter medications on file as of 12/16/2018.     Allergies (verified) Nsaids, Aleve [naproxen sodium], Codeine, Penicillins, and Sulfonamide derivatives   History: Past Medical History:  Diagnosis Date  . Allergic rhinitis   . Anemia   . Anxiety   . Chicken pox   . Chronic back pain   . COPD (chronic obstructive pulmonary disease) ([place])   . Depression   . Essential hypertension   . GERD (gastroesophageal reflux disease)   . Headache    migraines  . History of gastritis    EGD 2015  .  History of home oxygen therapy    2 liters at hs last 6 months  . Hyperlipidemia   . Hypothyroidism   . Migraines   . Osteoarthritis    oa  . Scoliosis    Past Surgical History:  Procedure Laterality Date  . APPENDECTOMY     1985  . BIOPSY  07/24/2017   Procedure: BIOPSY;  Surgeon: Milus Banister, MD;  Location: [facility];  Service: Endoscopy;;  . CARDIAC CATHETERIZATION N/A 10/31/2015   Procedure: Left Heart Cath and Coronary Angiography;  Surgeon: Leonie Man, MD;  Location: [facility];  Service: Cardiovascular;  Laterality: N/A;  . CARPAL TUNNEL RELEASE Left   . CARPAL TUNNEL RELEASE Right   . CHOLECYSTECTOMY  late 1980's  . COLONOSCOPY WITH PROPOFOL N/A 07/24/2017   Procedure: COLONOSCOPY WITH PROPOFOL;  Surgeon: Milus Banister, MD;  Location: [facility];  Service: Endoscopy;  Laterality: N/A;  . ESOPHAGOGASTRODUODENOSCOPY N/A 07/24/2017   Procedure: ESOPHAGOGASTRODUODENOSCOPY (EGD);  Surgeon: Milus Banister, MD;  Location: [facility];  Service: Endoscopy;  Laterality: N/A;  . GALLBLADDER SURGERY  1991  . HIP CLOSED REDUCTION Right 01/08/2016   Procedure: CLOSED MANIPULATION HIP;  Surgeon: Susa Day, MD;  Location: [facility];  Service: Orthopedics;  Laterality: Right;  . HIP CLOSED REDUCTION Right 01/19/2016   Procedure: ATTEMPTED CLOSED REDUCTION RIGHT HIP;  Surgeon: Wylene Simmer, MD;  Location: [facility];  Service: Orthopedics;  Laterality: Right;  . HIP CLOSED REDUCTION Right 01/20/2016   Procedure: CLOSED REDUCTION RIGHT TOTAL HIP;  Surgeon: Paralee Cancel, MD;  Location: [facility];  Service: Orthopedics;  Laterality: Right;  . HIP CLOSED REDUCTION Right 02/17/2016   Procedure: CLOSED REDUCTION RIGHT TOTAL HIP;  Surgeon: Rod Can, MD;  Location: [facility];  Service: Orthopedics;  Laterality: Right;  . HIP CLOSED REDUCTION Right 02/28/2016   Procedure: CLOSED REDUCTION HIP;  Surgeon: Nicholes Stairs, MD;  Location: [facility];  Service: Orthopedics;  Laterality:  Right;  . POLYPECTOMY  07/24/2017   Procedure: POLYPECTOMY;  Surgeon: Milus Banister, MD;  Location: [facility];  Service: Endoscopy;;  . TONSILLECTOMY    . TOTAL ABDOMINAL HYSTERECTOMY     1985, with 1 ovary removed and 2 nd ovary removed 2003  . TOTAL HIP ARTHROPLASTY Right    Original surgery 2006 with revision 2010  . TOTAL HIP REVISION Right 01/01/2016   Procedure: TOTAL HIP REVISION;  Surgeon: Paralee Cancel, MD;  Location: [facility];  Service: Orthopedics;  Laterality: Right;  . TOTAL HIP REVISION Right 03/02/2016   Procedure: TOTAL HIP REVISION;  Surgeon: Paralee Cancel, MD;  Location[facility];  Service: Orthopedics;  Laterality: Right;  . TOTAL HIP REVISION Right 09/02/2016   Procedure: Right hip constrained liner- posterior;  Surgeon: Paralee Cancel, MD;  Location: [facility];  Service: Orthopedics;  Laterality: Right;  . ULNAR NERVE TRANSPOSITION Right    Family History  Problem Relation Age of Onset  . COPD Mother   . Heart disease Mother   . Lung disease Father        Asbestosis  . Heart attack Father   . Heart disease Father   . Cerebral aneurysm Brother   . Aneurysm Brother        Brain  . Epilepsy Son   . Arthritis Maternal Grandmother   . Heart disease Maternal Grandmother   . Asthma Maternal Grandfather   . Cancer Maternal Grandfather   . Arthritis Paternal Grandmother   . Heart disease Paternal Grandmother   . Stroke Paternal Grandmother   . Early death Paternal Grandfather   . Heart disease Paternal Grandfather    Social History   Socioeconomic History  . Marital status: Married    Spouse name: Not on file  . Number of children: Not on file  . Years of education: Not on file  . Highest education level: Not on file  Occupational History  . Not on file  Social Needs  . Financial resource strain: Not on file  . Food insecurity    Worry: Not on file    Inability: Not on file  . Transportation needs    Medical: Not on file    Non-medical: Not on file  Tobacco  Use  . Smoking status: Current Every Day Smoker    Packs/day: 1.50    Years: 46.00    Pack years: 69.00    Types: Cigarettes  . Smokeless tobacco: Never Used  Substance and Sexual Activity  . Alcohol use: No  . Drug use: No  . Sexual activity: Yes  Lifestyle  . Physical activity    Days per week: Not on file    Minutes per session: Not on file  . Stress: Not on file  Relationships  . Social Herbalist on phone: Not on file    Gets together: Not on file    Attends religious service: Not on file    Active member of club or organization: Not on file    Attends meetings of clubs or organizations: Not on file    Relationship status: Not on file  Other Topics Concern  . Not on file  Social History Narrative   Right handed    Caffeine~ 2 cups per day    Lives at home with husband (strained relationship)   Primary caretaker for disabled brother who had aneurism   Daughter died 05-Jul-2018     Tobacco Counseling Ready to quit: Not Answered Counseling given: Not Answered   Clinical Intake:  Pre-visit preparation completed: Yes  Pain : No/denies pain  Diabetes: No  How often do you need to have someone help you when you read instructions, pamphlets, or other written materials from your doctor or pharmacy?: 2 - Rarely  Interpreter Needed?: No  Information entered by :: Denman George LPN   Activities of Daily Living In your present state of health, do you have any difficulty performing the following activities: 12/16/2018 05/25/2018  Hearing? N N  Vision? N N  Difficulty concentrating or making decisions? N N  Walking or climbing stairs? Y N  Comment due to respiratory status -  Dressing or bathing? N N  Doing errands, shopping? N N  Preparing Food and eating ? N -  Using the Toilet? N -  In the past six months, have you accidently leaked urine? N -  Do you have problems with loss of bowel control?  N -  Managing your Medications? N -  Managing your  Finances? N -  Housekeeping or managing your Housekeeping? N -  Some recent data might be hidden     Immunizations and Health Maintenance Immunization History  Administered Date(s) Administered  . Influenza,inj,Quad PF,6+ Mos 10/02/2018  . Influenza-Unspecified 01/16/2011, 11/26/2011, 12/22/2012, 11/17/2013   There are no preventive care reminders to display for this patient.  Patient Care Team: Delorse Limber as PCP - General (Family Medicine) Milus Banister, MD as Attending Physician (Gastroenterology) Tanda Rockers, MD as Consulting Physician (Pulmonary Disease) Dr. Darylene Price, MD as Consulting Physician (Orthopedic Surgery) Latanya Maudlin, MD as Consulting Physician (Orthopedic Surgery) Kathrynn Ducking, MD as Consulting Physician (Neurology) Okey Regal, [place] as Consulting Physician (Optometry)  Indicate any recent Medical Services you may have received from other than [facility] providers in the past year (date may be approximate).     Assessment:   This is a routine wellness examination for [place].  Hearing/Vision screen No exam data present  Dietary issues and exercise activities discussed: Current Exercise Habits: The patient does not participate in regular exercise at present, Exercise limited by: respiratory conditions(s)  Goals   None    Depression Screen PHQ 2/9 Scores 12/16/2018 11/10/2018 05/25/2018 11/24/2017 07/22/2017 12/29/2013  PHQ - 2 Score 5 1 1 3 2  0  PHQ- 9 Score 13 3 1 8 4  -    Fall Risk Fall Risk  12/16/2018 05/25/2018 12/29/2013  Falls in the past year? 0 0 No  Number falls in past yr: - 0 -  Injury with Fall? 0 0 -  Risk for fall due to : History of fall(s);Impaired mobility;Orthopedic patient Impaired mobility -  Risk for fall due to: Comment - Knee pain -  Follow up Falls evaluation completed;Education provided;Falls prevention discussed Falls evaluation completed -    Is the patient's home free of loose throw  rugs in walkways, pet beds, electrical cords, etc?   yes      Grab bars in the bathroom? yes      Handrails on the stairs?   yes      Adequate lighting?   yes  Timed Get Up and Go Performed completed and within normal timeframe; no gait abnormalities noted, patient does utilize walker to aid ambulation   Cognitive Function: no cognitive concerns noted at this time Cognitive Testing  Alert? Yes         Normal Appearance? Yes  Oriented to person? Yes           Place? Yes  Time? Yes  Recall of three objects? Yes  Can perform simple calculations? Yes  Displays appropriate judgment? Yes  Can read the correct time from a watch face? Yes      Screening Tests Health Maintenance  Topic Date Due  . Hepatitis C Screening  05/25/2019 (Originally 1955/09/02)  . HIV Screening  08/31/2019 (Originally 01/30/1970)  . DTaP/Tdap/Td (1 - Tdap) 11/10/2019 (Originally 01/30/1974)  . TETANUS/TDAP  11/10/2019 (Originally 01/30/1974)  . MAMMOGRAM  05/28/2019  . COLONOSCOPY  07/25/2027  . INFLUENZA VACCINE  Completed    Qualifies for Shingles Vaccine? Discussed and patient will check with pharmacy for coverage.  Patient education handout provided   Cancer Screenings: Lung: Low Dose CT Chest recommended if Age 17-80 years, 30 pack-year currently smoking OR have quit w/in 15years. Patient does qualify. Followed by pulmonology  Breast: Up to date on Mammogram? Yes  (would like to defer yearly  screening and have next mammogram 04/2019) Up to date of Bone Density/Dexa? Yes Colorectal: colonoscopy 07/24/17 with Dr. Ardis Hughs; repeat 10 years    Plan:  I have personally reviewed and addressed the Medicare Annual Wellness questionnaire and have noted the following in the patient's chart:  A. Medical and social history B. Use of alcohol, tobacco or illicit drugs  C. Current medications and supplements D. Functional ability and status E.  Nutritional status F.  Physical activity G. Advance directives H. List of other  physicians I.  Hospitalizations, surgeries, and ER visits in previous 12 months J.  [place] such as hearing and vision if needed, cognitive and depression L. Referrals, records requested, and appointments- will request last eye exam notes   In addition, I have reviewed and discussed with patient certain preventive protocols, quality metrics, and best practice recommendations. A written personalized care plan for preventive services as well as general preventive health recommendations were provided to patient.   Signed,  Denman George, LPN  Nurse Health Advisor   Nurse Notes: Patient to further discuss depression screening with provider

## 2018-12-16 NOTE — Patient Instructions (Addendum)
[name] , Thank you for taking time to come for your Medicare Wellness Visit. I appreciate your ongoing commitment to your health goals. Please review the following plan we discussed and let me know if I can assist you in the future.   Screening recommendations/referrals: Colorectal Screening: up to date; last 07/24/17 with Dr. Ardis Hughs Mammogram: up to date; last 05/27/17 (will defer until 04/2019) Bone Density: up to date; last 06/12/17  Vision and Dental Exams: Recommended annual ophthalmology exams for early detection of glaucoma and other disorders of the eye Recommended annual dental exams for proper oral hygiene  Vaccinations: Influenza vaccine: completed 10/02/18 Pneumococcal vaccine: recommended follow recommendations from pulmonology  Tdap vaccine: recommended; Please call your insurance company to determine your out of pocket expense. You may receive this vaccine at your local pharmacy or Health Dept. Shingles vaccine: Please call your insurance company to determine your out of pocket expense for the Shingrix vaccine. You may receive this vaccine at your local pharmacy.  Advanced directives: We have received a copy of your POA (Power of [place]) and/or Living Will. These documents can be located in your chart.  Goals: Recommend to drink at least 6-8 8oz glasses of water per day and consume a balanced diet rich in fresh fruits and vegetables.   Recommend to continue efforts to reduce smoking habits until no longer smoking. Smoking Cessation literature is attached below.  Next appointment: Please schedule your Annual Wellness Visit with your Nurse Health Advisor in one year.  Preventive Care 40-64 Years, Female Preventive care refers to lifestyle choices and visits with your health care provider that can promote health and wellness. What does preventive care include?  A yearly physical exam. This is also called an annual well check.  Dental exams once or twice a year.  Routine  eye exams. Ask your health care provider how often you should have your eyes checked.  Personal lifestyle choices, including:  Daily care of your teeth and gums.  Regular physical activity.  Eating a healthy diet.  Avoiding tobacco and drug use.  Limiting alcohol use.  Practicing safe sex.  Taking low-dose aspirin daily starting at age 20 if recommended by your health care provider.  Taking vitamin and mineral supplements as recommended by your health care provider. What happens during an annual well check? The services and screenings done by your health care provider during your annual well check will depend on your age, overall health, lifestyle risk factors, and family history of disease. Counseling  Your health care provider may ask you questions about your:  Alcohol use.  Tobacco use.  Drug use.  Emotional well-being.  Home and relationship well-being.  Sexual activity.  Eating habits.  Work and work Statistician.  Method of birth control.  Menstrual cycle.  Pregnancy history. Screening  You may have the following tests or measurements:  Height, weight, and BMI.  Blood pressure.  Lipid and cholesterol levels. These may be checked every 5 years, or more frequently if you are over 59 years old.  Skin check.  Lung cancer screening. You may have this screening every year starting at age 66 if you have a 30-pack-year history of smoking and currently smoke or have quit within the past 15 years.  Fecal occult blood test (FOBT) of the stool. You may have this test every year starting at age 54.  Flexible sigmoidoscopy or colonoscopy. You may have a sigmoidoscopy every 5 years or a colonoscopy every 10 years starting at age 19.  Hepatitis C blood test.  Hepatitis B blood test.  Sexually transmitted disease (STD) testing.  Diabetes screening. This is done by checking your blood sugar (glucose) after you have not eaten for a while (fasting). You may have  this done every 1-3 years.  Mammogram. This may be done every 1-2 years. Talk to your health care provider about when you should start having regular mammograms. This may depend on whether you have a family history of breast cancer.  BRCA-related cancer screening. This may be done if you have a family history of breast, ovarian, tubal, or peritoneal cancers.  Pelvic exam and Pap test. This may be done every 3 years starting at age 78. Starting at age 53, this may be done every 5 years if you have a Pap test in combination with an HPV test.  Bone density scan. This is done to screen for osteoporosis. You may have this scan if you are at high risk for osteoporosis. Discuss your test results, treatment options, and if necessary, the need for more tests with your health care provider. Vaccines  Your health care provider may recommend certain vaccines, such as:  Influenza vaccine. This is recommended every year.  Tetanus, diphtheria, and acellular pertussis (Tdap, Td) vaccine. You may need a Td booster every 10 years.  Zoster vaccine. You may need this after age 76.  Pneumococcal 13-valent conjugate (PCV13) vaccine. You may need this if you have certain conditions and were not previously vaccinated.  Pneumococcal polysaccharide (PPSV23) vaccine. You may need one or two doses if you smoke cigarettes or if you have certain conditions. Talk to your health care provider about which screenings and vaccines you need and how often you need them. This information is not intended to replace advice given to you by your health care provider. Make sure you discuss any questions you have with your health care provider. Document Released: 02/10/2015 Document Revised: 10/04/2015 Document Reviewed: 11/15/2014 Elsevier Interactive Patient Education  2017 [place] Prevention in the Home Falls can cause injuries. They can happen to people of all ages. There are many things you can do to make your  home safe and to help prevent falls. What can I do on the outside of my home?  Regularly fix the edges of walkways and driveways and fix any cracks.  Remove anything that might make you trip as you walk through a door, such as a raised step or threshold.  Trim any bushes or trees on the path to your home.  Use bright outdoor lighting.  Clear any walking paths of anything that might make someone trip, such as rocks or tools.  Regularly check to see if handrails are loose or broken. Make sure that both sides of any steps have handrails.  Any raised decks and porches should have guardrails on the edges.  Have any leaves, snow, or ice cleared regularly.  Use sand or salt on walking paths during winter.  Clean up any spills in your garage right away. This includes oil or grease spills. What can I do in the bathroom?  Use night lights.  Install grab bars by the toilet and in the tub and shower. Do not use towel bars as grab bars.  Use non-skid mats or decals in the tub or shower.  If you need to sit down in the shower, use a plastic, non-slip stool.  Keep the floor dry. Clean up any water that spills on the floor as soon as it  happens.  Remove soap buildup in the tub or shower regularly.  Attach bath mats securely with double-sided non-slip rug tape.  Do not have throw rugs and other things on the floor that can make you trip. What can I do in the bedroom?  Use night lights.  Make sure that you have a light by your bed that is easy to reach.  Do not use any sheets or blankets that are too big for your bed. They should not hang down onto the floor.  Have a firm chair that has side arms. You can use this for support while you get dressed.  Do not have throw rugs and other things on the floor that can make you trip. What can I do in the kitchen?  Clean up any spills right away.  Avoid walking on wet floors.  Keep items that you use a lot in easy-to-reach places.  If  you need to reach something above you, use a strong step stool that has a grab bar.  Keep electrical cords out of the way.  Do not use floor polish or wax that makes floors slippery. If you must use wax, use non-skid floor wax.  Do not have throw rugs and other things on the floor that can make you trip. What can I do with my stairs?  Do not leave any items on the stairs.  Make sure that there are handrails on both sides of the stairs and use them. Fix handrails that are broken or loose. Make sure that handrails are as long as the stairways.  Check any carpeting to make sure that it is firmly attached to the stairs. Fix any carpet that is loose or worn.  Avoid having throw rugs at the top or bottom of the stairs. If you do have throw rugs, attach them to the floor with carpet tape.  Make sure that you have a light switch at the top of the stairs and the bottom of the stairs. If you do not have them, ask someone to add them for you. What else can I do to help prevent falls?  Wear shoes that:  Do not have high heels.  Have rubber bottoms.  Are comfortable and fit you well.  Are closed at the toe. Do not wear sandals.  If you use a stepladder:  Make sure that it is fully opened. Do not climb a closed stepladder.  Make sure that both sides of the stepladder are locked into place.  Ask someone to hold it for you, if possible.  Clearly mark and make sure that you can see:  Any grab bars or handrails.  First and last steps.  Where the edge of each step is.  Use tools that help you move around (mobility aids) if they are needed. These include:  Canes.  Walkers.  Scooters.  Crutches.  Turn on the lights when you go into a dark area. Replace any light bulbs as soon as they burn out.  Set up your furniture so you have a clear path. Avoid moving your furniture around.  If any of your floors are uneven, fix them.  If there are any pets around you, be aware of where  they are.  Review your medicines with your doctor. Some medicines can make you feel dizzy. This can increase your chance of falling. Ask your doctor what other things that you can do to help prevent falls. This information is not intended to replace advice given to you by  your health care provider. Make sure you discuss any questions you have with your health care provider. Document Released: 11/10/2008 Document Revised: 06/22/2015 Document Reviewed: 02/18/2014 Elsevier Interactive Patient Education  2017 [employer].

## 2018-12-16 NOTE — Progress Notes (Signed)
Patient presents to clinic today for follow-up regarding chronic pain and mood. At last visit, her Sertraline was swapped for Cymbalta to hopefully hep her more with mood and pain levels. Also switched her from Oxycodone to Percocet to see if addition of tylenol would be beneficial for her. Gabapentin was continued at previous dose. Since last visit, patient endorses taking her medications as directed. Notes improvement in pain with Cymbalta and Percocet. Is feeling better with this. Unfortunately even though she feels mood was initially better she has been dealins with significant stressors including still dealing with the unexpected and sudden death of her daughter. Notes her husband is not a good support system because he never got along with his step-daughter. Feels she does not have someone to talk to. Also her son who has history of drug abuse is now living back with them as he got kicked out of his home in [place]. This is adding to all of her stress levels. Denies SI/HI.   Past Medical History:  Diagnosis Date  . Allergic rhinitis   . Anemia   . Anxiety   . Chicken pox   . Chronic back pain   . COPD (chronic obstructive pulmonary disease) ([place])   . Depression   . Essential hypertension   . GERD (gastroesophageal reflux disease)   . Headache    migraines  . History of gastritis    EGD 2015  . History of home oxygen therapy    2 liters at hs last 6 months  . Hyperlipidemia   . Hypothyroidism   . Migraines   . Osteoarthritis    oa  . Scoliosis     Current Outpatient Medications on File Prior to Visit  Medication Sig Dispense Refill  . albuterol (PROAIR HFA) 108 (90 Base) MCG/ACT inhaler 2 puffs every 4 hours as needed only  if your can't catch your breath 6.7 g 0  . albuterol (VENTOLIN HFA) 108 (90 Base) MCG/ACT inhaler Inhale 2 puffs into the lungs every 4 (four) hours as needed for wheezing or shortness of breath. 8 g 1  . atorvastatin (LIPITOR) 20 MG tablet TAKE 1 TABLET BY  MOUTH AT BEDTIME 90 tablet 1  . DULoxetine (CYMBALTA) 60 MG capsule TAKE 1 CAPSULE BY MOUTH EVERY DAY 90 capsule 1  . famotidine (PEPCID) 20 MG tablet Take 1 tablet (20 mg total) by mouth 2 (two) times daily. 60 tablet 11  . fluticasone (FLONASE) 50 MCG/ACT nasal spray Place 2 sprays into both nostrils daily.    Marland Kitchen gabapentin (NEURONTIN) 300 MG capsule Take 600 mg by mouth 3 times daily    . INCRUSE ELLIPTA 62.5 MCG/INH AEPB INHALE 1 PUFF INTO THE LUNGS DAILY 30 each 5  . levothyroxine (SYNTHROID) 75 MCG tablet TAKE 1 TABLET BY MOUTH DAILY BEFORE BREAKFAST 90 tablet 1  . loratadine (CLARITIN) 10 MG tablet Take 10 mg by mouth daily.    . meloxicam (MOBIC) 15 MG tablet Take 15 mg by mouth daily.    Marland Kitchen nystatin (MYCOSTATIN/NYSTOP) powder Apply topically 2 (two) times daily. 15 g 0  . omeprazole (PRILOSEC) 40 MG capsule TAKE 1 CAPSULE BY MOUTH BEFORE BREAKFAST AND TAKE 1 CAPSULE BY MOUTH BEFORE DINNER 60 capsule 3  . OXYGEN Inhale 2 L into the lungs at bedtime.     . potassium chloride (K-DUR) 10 MEQ tablet TAKE 1 TABLET BY MOUTH AT BEDTIME 90 tablet 1  . SYMBICORT 160-4.5 MCG/ACT inhaler INHALE TWO PUFFS BY MOUTH TWICE DAILY 10.2 g  5  . triamterene-hydrochlorothiazide (MAXZIDE-25) 37.5-25 MG tablet TAKE 1 TABLET BY MOUTH EVERY DAY 90 tablet 1   No current facility-administered medications on file prior to visit.     Allergies  Allergen Reactions  . Nsaids Diarrhea  . Aleve [Naproxen Sodium] Other (See Comments)    Headache   . Codeine Nausea Only and Other (See Comments)    GI upset  . Penicillins Nausea Only and Other (See Comments)    GI upset Has patient had a PCN reaction causing immediate rash, facial/tongue/throat swelling, SOB or lightheadedness with hypotension: No Has patient had a PCN reaction causing severe rash involving mucus membranes or skin necrosis: No Has patient had a PCN reaction that required hospitalization No Has patient had a PCN reaction occurring within the last  10 years: No If all of the above answers are "NO", then may proceed with Cephalosporin use.   . Sulfonamide Derivatives Hives    Family History  Problem Relation Age of Onset  . COPD Mother   . Heart disease Mother   . Lung disease Father        Asbestosis  . Heart attack Father   . Heart disease Father   . Cerebral aneurysm Brother   . Aneurysm Brother        Brain  . Epilepsy Son   . Arthritis Maternal Grandmother   . Heart disease Maternal Grandmother   . Asthma Maternal Grandfather   . Cancer Maternal Grandfather   . Arthritis Paternal Grandmother   . Heart disease Paternal Grandmother   . Stroke Paternal Grandmother   . Early death Paternal Grandfather   . Heart disease Paternal Grandfather     Social History   Socioeconomic History  . Marital status: Married    Spouse name: Not on file  . Number of children: Not on file  . Years of education: Not on file  . Highest education level: Not on file  Occupational History  . Not on file  Social Needs  . Financial resource strain: Not on file  . Food insecurity    Worry: Not on file    Inability: Not on file  . Transportation needs    Medical: Not on file    Non-medical: Not on file  Tobacco Use  . Smoking status: Current Every Day Smoker    Packs/day: 1.50    Years: 46.00    Pack years: 69.00    Types: Cigarettes  . Smokeless tobacco: Never Used  Substance and Sexual Activity  . Alcohol use: No  . Drug use: No  . Sexual activity: Yes  Lifestyle  . Physical activity    Days per week: Not on file    Minutes per session: Not on file  . Stress: Not on file  Relationships  . Social Herbalist on phone: Not on file    Gets together: Not on file    Attends religious service: Not on file    Active member of club or organization: Not on file    Attends meetings of clubs or organizations: Not on file    Relationship status: Not on file  Other Topics Concern  . Not on file  Social History  Narrative   Right handed    Caffeine~ 2 cups per day    Lives at home with husband (strained relationship)   Primary caretaker for disabled brother who had aneurism   Daughter died 07/17/18    Review of Systems - See  HPI.  All other ROS are negative.  BP (!) 148/70 (BP Location: Left Arm, Patient Position: Sitting, Cuff Size: Large)   Temp (!) 96.1 F (35.6 C)   Ht 5' 2" (1.575 m)   Wt 267 lb 3.2 oz (121.2 kg)   BMI 48.87 kg/m   Physical Exam Vitals signs reviewed.  Constitutional:      Appearance: Normal appearance.  HENT:     Head: Normocephalic and atraumatic.  Eyes:     Conjunctiva/sclera: Conjunctivae normal.     Pupils: Pupils are equal, round, and reactive to light.  Neck:     Musculoskeletal: Neck supple.  Cardiovascular:     Rate and Rhythm: Normal rate and regular rhythm.     Heart sounds: Normal heart sounds.  Pulmonary:     Effort: Pulmonary effort is normal.     Breath sounds: Normal breath sounds.  Neurological:     General: No focal deficit present.     Mental Status: She is alert.  Psychiatric:        Mood and Affect: Mood normal.    Recent Results (from the past 2160 hour(s))  Lipid Profile     Status: Abnormal   Collection Time: 10/02/18  1:59 PM  Result Value Ref Range   Cholesterol 126 0 - 200 mg/dL    Comment: ATP III Classification       Desirable:  < 200 mg/dL               Borderline High:  200 - 239 mg/dL          High:  > = 240 mg/dL   Triglycerides 155.0 (H) 0.0 - 149.0 mg/dL    Comment: Normal:  <150 mg/dLBorderline High:  150 - 199 mg/dL   HDL 30.90 (L) >39.00 mg/dL   VLDL 31.0 0.0 - 40.0 mg/dL   LDL Cholesterol 64 0 - 99 mg/dL   Total CHOL/HDL Ratio 4     Comment:                Men          Women1/2 Average Risk     3.4          3.3Average Risk          5.0          4.42X Average Risk          9.6          7.13X Average Risk          15.0          11.0                       NonHDL 94.99     Comment: NOTE:  Non-HDL goal should be 30  mg/dL higher than patient's LDL goal (i.e. LDL goal of < 70 mg/dL, would have non-HDL goal of < 100 mg/dL)  Comp Met (CMET)     Status: Abnormal   Collection Time: 10/02/18  1:59 PM  Result Value Ref Range   Sodium 138 135 - 145 mEq/L   Potassium 3.9 3.5 - 5.1 mEq/L   Chloride 99 96 - 112 mEq/L   CO2 29 19 - 32 mEq/L   Glucose, Bld 93 70 - 99 mg/dL   BUN 15 6 - 23 mg/dL   Creatinine, Ser 1.02 0.40 - 1.20 mg/dL   Total Bilirubin 0.3 0.2 - 1.2 mg/dL   Alkaline Phosphatase 62 39 -  117 U/L   AST 47 (H) 0 - 37 U/L   ALT 29 0 - 35 U/L   Total Protein 6.7 6.0 - 8.3 g/dL   Albumin 3.5 3.5 - 5.2 g/dL   Calcium 8.3 (L) 8.4 - 10.5 mg/dL   GFR 54.79 (L) >60.00 mL/min  Lipid Profile     Status: Abnormal   Collection Time: 11/04/18  3:22 PM  Result Value Ref Range   Cholesterol 135 0 - 200 mg/dL    Comment: ATP III Classification       Desirable:  < 200 mg/dL               Borderline High:  200 - 239 mg/dL          High:  > = 240 mg/dL   Triglycerides 194.0 (H) 0.0 - 149.0 mg/dL    Comment: Normal:  <150 mg/dLBorderline High:  150 - 199 mg/dL   HDL 34.80 (L) >39.00 mg/dL   VLDL 38.8 0.0 - 40.0 mg/dL   LDL Cholesterol 61 0 - 99 mg/dL   Total CHOL/HDL Ratio 4     Comment:                Men          Women1/2 Average Risk     3.4          3.3Average Risk          5.0          4.42X Average Risk          9.6          7.13X Average Risk          15.0          11.0                       NonHDL 100.24     Comment: NOTE:  Non-HDL goal should be 30 mg/dL higher than patient's LDL goal (i.e. LDL goal of < 70 mg/dL, would have non-HDL goal of < 100 mg/dL)  Hepatic function panel     Status: None   Collection Time: 11/04/18  3:22 PM  Result Value Ref Range   Total Bilirubin 0.4 0.2 - 1.2 mg/dL   Bilirubin, Direct 0.1 0.0 - 0.3 mg/dL   Alkaline Phosphatase 58 39 - 117 U/L   AST 37 0 - 37 U/L   ALT 25 0 - 35 U/L   Total Protein 6.7 6.0 - 8.3 g/dL   Albumin 3.8 3.5 - 5.2 g/dL  Comp Met (CMET)      Status: Abnormal   Collection Time: 11/04/18  3:22 PM  Result Value Ref Range   Sodium 139 135 - 145 mEq/L   Potassium 4.2 3.5 - 5.1 mEq/L   Chloride 101 96 - 112 mEq/L   CO2 30 19 - 32 mEq/L   Glucose, Bld 87 70 - 99 mg/dL   BUN 14 6 - 23 mg/dL   Creatinine, Ser 0.97 0.40 - 1.20 mg/dL   Total Bilirubin 0.4 0.2 - 1.2 mg/dL   Alkaline Phosphatase 58 39 - 117 U/L   AST 37 0 - 37 U/L   ALT 25 0 - 35 U/L   Total Protein 6.7 6.0 - 8.3 g/dL   Albumin 3.8 3.5 - 5.2 g/dL   Calcium 8.3 (L) 8.4 - 10.5 mg/dL   GFR 58.05 (L) >60.00 mL/min  TSH     Status: None  Collection Time: 11/10/18  3:16 PM  Result Value Ref Range   TSH 4.40 0.35 - 4.50 uIU/mL  PTH, Intact and Calcium     Status: Abnormal   Collection Time: 11/10/18  3:16 PM  Result Value Ref Range   PTH CANCELED     Comment: TEST NOT PERFORMED . No suitable specimen received.  Result canceled by the ancillary.    Calcium 8.5 (L) 8.6 - 10.4 mg/dL  PTH, Intact and Calcium     Status: Abnormal   Collection Time: 12/16/18  3:26 PM  Result Value Ref Range   PTH 50 14 - 64 pg/mL    Comment: . Interpretive Guide    Intact PTH           Calcium ------------------    ----------           ------- Normal Parathyroid    Normal               Normal Hypoparathyroidism    Low or Low Normal    Low Hyperparathyroidism    Primary            Normal or High       High    Secondary          High                 Normal or Low    Tertiary           High                 High Non-Parathyroid    Hypercalcemia      Low or Low Normal    High .    Calcium 8.4 (L) 8.6 - 10.4 mg/dL  Magnesium     Status: None   Collection Time: 12/16/18  3:26 PM  Result Value Ref Range   Magnesium 1.5 1.5 - 2.5 mg/dL    Assessment/Plan: 1. Chronic pain syndrome Improved. Continue Percocet at current dose. Will increase Cymbalta to 80 mg for further improvement. Close follow-up scheduled.  - oxyCODONE-acetaminophen (PERCOCET) 10-325 MG tablet; Take 1 tablet by  mouth every 6 (six) hours as needed for pain.  Dispense: 120 tablet; Refill: 0  2. Anxiety and depression Significant stressors. Increase Cymbalta to 80 mg QD. Grief counseling, free through Hospice, recommended. F/U scheduled  3. Hypocalcemia Overdue for PTH check. Will check PTH, Calcium and Magnesium - PTH, Intact and Calcium - Magnesium   [name], PA-C

## 2018-12-17 LAB — MAGNESIUM: Magnesium: 1.5 mg/dL (ref 1.5–2.5)

## 2018-12-18 ENCOUNTER — Other Ambulatory Visit: Payer: Self-pay | Admitting: Physician Assistant

## 2018-12-18 LAB — PTH, INTACT AND CALCIUM
Calcium: 8.4 mg/dL — ABNORMAL LOW (ref 8.6–10.4)
PTH: 50 pg/mL (ref 14–64)

## 2018-12-18 NOTE — Telephone Encounter (Signed)
Xanax last rx 11/23/18 #90 LOV: 12/16/18 increase xanax to qid CSC: 08/21/17

## 2018-12-28 ENCOUNTER — Emergency Department (HOSPITAL_COMMUNITY)
Admission: EM | Admit: 2018-12-28 | Discharge: 2018-12-28 | Disposition: A | Discharge status: Home / Self Care | Payer: Medicare Other | Attending: Emergency Medicine | Admitting: Emergency Medicine

## 2018-12-28 ENCOUNTER — Encounter (HOSPITAL_COMMUNITY): Payer: Self-pay | Admitting: Emergency Medicine

## 2018-12-28 ENCOUNTER — Ambulatory Visit (INDEPENDENT_AMBULATORY_CARE_PROVIDER_SITE_OTHER): Payer: Medicare Other | Admitting: Physician Assistant

## 2018-12-28 ENCOUNTER — Emergency Department (HOSPITAL_COMMUNITY): Payer: Medicare Other

## 2018-12-28 ENCOUNTER — Other Ambulatory Visit: Payer: Self-pay

## 2018-12-28 ENCOUNTER — Encounter: Payer: Self-pay | Admitting: Physician Assistant

## 2018-12-28 DIAGNOSIS — R0902 Hypoxemia: Secondary | ICD-10-CM | POA: Diagnosis not present

## 2018-12-28 DIAGNOSIS — Z20822 Contact with and (suspected) exposure to covid-19: Secondary | ICD-10-CM

## 2018-12-28 DIAGNOSIS — Z20828 Contact with and (suspected) exposure to other viral communicable diseases: Secondary | ICD-10-CM | POA: Diagnosis not present

## 2018-12-28 DIAGNOSIS — Z96641 Presence of right artificial hip joint: Secondary | ICD-10-CM | POA: Diagnosis not present

## 2018-12-28 DIAGNOSIS — J441 Chronic obstructive pulmonary disease with (acute) exacerbation: Secondary | ICD-10-CM

## 2018-12-28 DIAGNOSIS — R509 Fever, unspecified: Secondary | ICD-10-CM | POA: Diagnosis not present

## 2018-12-28 DIAGNOSIS — J209 Acute bronchitis, unspecified: Secondary | ICD-10-CM

## 2018-12-28 DIAGNOSIS — E039 Hypothyroidism, unspecified: Secondary | ICD-10-CM | POA: Diagnosis not present

## 2018-12-28 DIAGNOSIS — Z79899 Other long term (current) drug therapy: Secondary | ICD-10-CM | POA: Diagnosis not present

## 2018-12-28 DIAGNOSIS — R0602 Shortness of breath: Secondary | ICD-10-CM | POA: Diagnosis not present

## 2018-12-28 DIAGNOSIS — I10 Essential (primary) hypertension: Secondary | ICD-10-CM | POA: Diagnosis not present

## 2018-12-28 DIAGNOSIS — F17210 Nicotine dependence, cigarettes, uncomplicated: Secondary | ICD-10-CM | POA: Diagnosis not present

## 2018-12-28 LAB — CBC
HCT: 37.5 % (ref 36.0–46.0)
Hemoglobin: 11 g/dL — ABNORMAL LOW (ref 12.0–15.0)
MCH: 26.7 pg (ref 26.0–34.0)
MCHC: 29.3 g/dL — ABNORMAL LOW (ref 30.0–36.0)
MCV: 91 fL (ref 80.0–100.0)
Platelets: 206 10*3/uL (ref 150–400)
RBC: 4.12 MIL/uL (ref 3.87–5.11)
RDW: 20 % — ABNORMAL HIGH (ref 11.5–15.5)
WBC: 13.8 10*3/uL — ABNORMAL HIGH (ref 4.0–10.5)
nRBC: 0 % (ref 0.0–0.2)

## 2018-12-28 LAB — BASIC METABOLIC PANEL
Anion gap: 11 (ref 5–15)
BUN: 15 mg/dL (ref 8–23)
CO2: 26 mmol/L (ref 22–32)
Calcium: 8 mg/dL — ABNORMAL LOW (ref 8.9–10.3)
Chloride: 96 mmol/L — ABNORMAL LOW (ref 98–111)
Creatinine, Ser: 1.23 mg/dL — ABNORMAL HIGH (ref 0.44–1.00)
GFR calc Af Amer: 54 mL/min — ABNORMAL LOW (ref >60)
GFR calc non Af Amer: 47 mL/min — ABNORMAL LOW (ref >60)
Glucose, Bld: 173 mg/dL — ABNORMAL HIGH (ref 70–99)
Potassium: 3.8 mmol/L (ref 3.5–5.1)
Sodium: 133 mmol/L — ABNORMAL LOW (ref 135–145)

## 2018-12-28 LAB — POC SARS CORONAVIRUS 2 AG -  ED: SARS Coronavirus 2 Ag: NEGATIVE

## 2018-12-28 MED ORDER — ALBUTEROL SULFATE HFA 108 (90 BASE) MCG/ACT IN AERS
4.0000 | INHALATION_SPRAY | Freq: Once | RESPIRATORY_TRACT | Status: AC
  Administered 2018-12-28: 4 via RESPIRATORY_TRACT
  Filled 2018-12-28: qty 6.7

## 2018-12-28 MED ORDER — AZITHROMYCIN 250 MG PO TABS: 250.0000 mg | ORAL_TABLET | Freq: Every day | ORAL | 0 refills | Status: DC

## 2018-12-28 MED ORDER — SODIUM CHLORIDE 0.9 % IV SOLN
1.0000 g | Freq: Once | INTRAVENOUS | Status: AC
  Administered 2018-12-28: 1 g via INTRAVENOUS
  Filled 2018-12-28: qty 10

## 2018-12-28 MED ORDER — AZITHROMYCIN 250 MG PO TABS
500.0000 mg | ORAL_TABLET | Freq: Once | ORAL | Status: AC
  Administered 2018-12-28: 500 mg via ORAL
  Filled 2018-12-28: qty 2

## 2018-12-28 MED ORDER — ACETAMINOPHEN 500 MG PO TABS
1000.0000 mg | ORAL_TABLET | Freq: Once | ORAL | Status: AC
  Administered 2018-12-28: 1000 mg via ORAL
  Filled 2018-12-28: qty 2

## 2018-12-28 MED ORDER — AEROCHAMBER PLUS FLO-VU MEDIUM MISC
1.0000 | Freq: Once | Status: AC
  Administered 2018-12-28: 1

## 2018-12-28 MED ORDER — PREDNISONE 10 MG (21) PO TBPK: ORAL_TABLET | ORAL | 0 refills | Status: DC

## 2018-12-28 MED ORDER — METHYLPREDNISOLONE SODIUM SUCC 125 MG IJ SOLR
125.0000 mg | Freq: Once | INTRAMUSCULAR | Status: AC
  Administered 2018-12-28: 125 mg via INTRAVENOUS
  Filled 2018-12-28: qty 2

## 2018-12-28 NOTE — ED Triage Notes (Signed)
Pt has COPD wna dc/o of worsening SOB since Thursday last week. Reports having fevers. Took tylenol at 5 am today and 11am took 2 more tylenol. Pt also c/o chest pains and hallucinations since Friday

## 2018-12-28 NOTE — ED Provider Notes (Signed)
[facility] DEPT Provider Note   CSN: [id number] Arrival date & time: 12/28/18  1411     History   Chief Complaint Chief Complaint  Patient presents with  . COPD  . Shortness of Breath  . Fever    HPI JNIYAH DANTUONO is a 63 y.o. female.     Pt presents to the ED today with sob, fever, and cough.  Pt has a hx of COPD, but is usually only on oxygen at night.  The pt said she has had to wear her oxygen more.  RA O2 sat 85%.  The pt denies any known covid exposures.     Past Medical History:  Diagnosis Date  . Allergic rhinitis   . Anemia   . Anxiety   . Chicken pox   . Chronic back pain   . COPD (chronic obstructive pulmonary disease) ([place])   . Depression   . Essential hypertension   . GERD (gastroesophageal reflux disease)   . Headache    migraines  . History of gastritis    EGD 2015  . History of home oxygen therapy    2 liters at hs last 6 months  . Hyperlipidemia   . Hypothyroidism   . Migraines   . Osteoarthritis    oa  . Scoliosis     Patient Active Problem List   Diagnosis Date Noted  . Tremor 11/05/2018  . Chronic respiratory failure with hypoxia ([place]) 01/12/2018  . GERD with esophagitis 11/26/2017  . Dysphagia 08/20/2017  . Chronic pain syndrome 08/04/2017  . Comorbid sleep-related hypoventilation 08/04/2017  . OSA (obstructive sleep apnea) 08/04/2017  . Hepatic steatosis 08/04/2017  . Polyp of ascending colon   . Diverticulosis of colon without hemorrhage   . Gastritis and gastroduodenitis   . Hypothyroidism 07/22/2017  . Anxiety and depression 07/22/2017  . Iron deficiency anemia 07/22/2017  . Pilar cyst 07/22/2017  . Morbid obesity due to excess calories ([place]) 03/19/2017  . Anemia 03/19/2017  . COPD GOLD II still smoking  03/18/2017  . Degenerative lumbar spinal stenosis 02/17/2017  . S/P right hip revision 09/02/2016  . S/P hip replacement 09/02/2016  . Instability of prosthetic hip ([place]) 03/01/2016   . Dislocation of hip prosthesis ([place]) 02/17/2016  . Abnormal nuclear stress test: Intermediate Risk 10/31/2015  . Atypical angina ([place]) 10/31/2015  . Trigger finger, acquired 08/20/2012  . Cigarette smoker 12/12/2008  . Hyperlipidemia 11/17/2008  . HYPERTENSION, BENIGN 11/16/2008  . GEN OSTEOARTHROSIS INVOLVING MULTIPLE SITES 11/16/2008    Past Surgical History:  Procedure Laterality Date  . APPENDECTOMY     1985  . BIOPSY  07/24/2017   Procedure: BIOPSY;  Surgeon: Milus Banister, MD;  Location: [facility];  Service: Endoscopy;;  . CARDIAC CATHETERIZATION N/A 10/31/2015   Procedure: Left Heart Cath and Coronary Angiography;  Surgeon: Leonie Man, MD;  Location: [facility];  Service: Cardiovascular;  Laterality: N/A;  . CARPAL TUNNEL RELEASE Left   . CARPAL TUNNEL RELEASE Right   . CHOLECYSTECTOMY  late 1980's  . COLONOSCOPY WITH PROPOFOL N/A 07/24/2017   Procedure: COLONOSCOPY WITH PROPOFOL;  Surgeon: Milus Banister, MD;  Location: [facility];  Service: Endoscopy;  Laterality: N/A;  . ESOPHAGOGASTRODUODENOSCOPY N/A 07/24/2017   Procedure: ESOPHAGOGASTRODUODENOSCOPY (EGD);  Surgeon: Milus Banister, MD;  Location: [facility];  Service: Endoscopy;  Laterality: N/A;  . GALLBLADDER SURGERY  1991  . HIP CLOSED REDUCTION Right 01/08/2016   Procedure: CLOSED MANIPULATION HIP;  Surgeon:  Susa Day, MD;  Location: [facility];  Service: Orthopedics;  Laterality: Right;  . HIP CLOSED REDUCTION Right 01/19/2016   Procedure: ATTEMPTED CLOSED REDUCTION RIGHT HIP;  Surgeon: Wylene Simmer, MD;  Location: [facility];  Service: Orthopedics;  Laterality: Right;  . HIP CLOSED REDUCTION Right 01/20/2016   Procedure: CLOSED REDUCTION RIGHT TOTAL HIP;  Surgeon: Paralee Cancel, MD;  Location: [facility];  Service: Orthopedics;  Laterality: Right;  . HIP CLOSED REDUCTION Right 02/17/2016   Procedure: CLOSED REDUCTION RIGHT TOTAL HIP;  Surgeon: Rod Can, MD;  Location: [facility];  Service:  Orthopedics;  Laterality: Right;  . HIP CLOSED REDUCTION Right 02/28/2016   Procedure: CLOSED REDUCTION HIP;  Surgeon: Nicholes Stairs, MD;  Location: [facility];  Service: Orthopedics;  Laterality: Right;  . POLYPECTOMY  07/24/2017   Procedure: POLYPECTOMY;  Surgeon: Milus Banister, MD;  Location: [facility];  Service: Endoscopy;;  . TONSILLECTOMY    . TOTAL ABDOMINAL HYSTERECTOMY     1985, with 1 ovary removed and 2 nd ovary removed 2003  . TOTAL HIP ARTHROPLASTY Right    Original surgery 2006 with revision 2010  . TOTAL HIP REVISION Right 01/01/2016   Procedure: TOTAL HIP REVISION;  Surgeon: Paralee Cancel, MD;  Location: [facility];  Service: Orthopedics;  Laterality: Right;  . TOTAL HIP REVISION Right 03/02/2016   Procedure: TOTAL HIP REVISION;  Surgeon: Paralee Cancel, MD;  Location: [facility];  Service: Orthopedics;  Laterality: Right;  . TOTAL HIP REVISION Right 09/02/2016   Procedure: Right hip constrained liner- posterior;  Surgeon: Paralee Cancel, MD;  Location: [facility];  Service: Orthopedics;  Laterality: Right;  . ULNAR NERVE TRANSPOSITION Right      OB History   No obstetric history on file.      Home Medications    Prior to Admission medications   Medication Sig Start Date End Date Taking? Authorizing Provider  albuterol (VENTOLIN HFA) 108 (90 Base) MCG/ACT inhaler Inhale 2 puffs into the lungs every 4 (four) hours as needed for wheezing or shortness of breath. 11/02/18  Yes Tanda Rockers, MD  ALPRAZolam Duanne Moron) 1 MG tablet Take 1 tablet (1 mg total) by mouth 4 (four) times daily as needed. for anxiety 12/18/18  Yes Brunetta Jeans, PA-C  atorvastatin (LIPITOR) 20 MG tablet TAKE 1 TABLET BY MOUTH AT BEDTIME Patient taking differently: Take 20 mg by mouth at bedtime.  08/17/18  Yes Brunetta Jeans, PA-C  DULoxetine (CYMBALTA) 20 MG capsule Take 1 capsule (20 mg total) by mouth daily. Take with 60 mg dose 12/16/18  Yes Brunetta Jeans, PA-C  DULoxetine (CYMBALTA) 60 MG capsule  TAKE 1 CAPSULE BY MOUTH EVERY DAY Patient taking differently: Take 60 mg by mouth daily.  12/17/18  Yes Brunetta Jeans, PA-C  famotidine (PEPCID) 20 MG tablet Take 1 tablet (20 mg total) by mouth 2 (two) times daily. 02/23/18  Yes Milus Banister, MD  fluticasone [facility]) 50 MCG/ACT nasal spray Place 2 sprays into both nostrils daily.   Yes [provider]  gabapentin (NEURONTIN) 300 MG capsule Take 600 mg by mouth 2 (two) times daily.    Yes [provider]  INCRUSE ELLIPTA 62.5 MCG/INH AEPB INHALE 1 PUFF INTO THE LUNGS DAILY Patient taking differently: Inhale 1 puff into the lungs daily.  12/15/18  Yes Parrett, Tammy S, NP  levothyroxine (SYNTHROID) 75 MCG tablet TAKE 1 TABLET BY MOUTH DAILY BEFORE BREAKFAST Patient taking differently: Take 75 mcg by mouth daily before  breakfast.  11/25/18  Yes Brunetta Jeans, PA-C  loratadine (CLARITIN) 10 MG tablet Take 10 mg by mouth daily.   Yes [provider]  meloxicam (MOBIC) 15 MG tablet Take 15 mg by mouth daily. 10/26/18  Yes [provider]  nystatin (MYCOSTATIN/NYSTOP) powder Apply topically 2 (two) times daily. Patient taking differently: Apply 1 Bottle topically 2 (two) times daily.  08/31/18  Yes Brunetta Jeans, PA-C  omeprazole (PRILOSEC) 40 MG capsule TAKE 1 CAPSULE BY MOUTH BEFORE BREAKFAST AND TAKE 1 CAPSULE BY MOUTH BEFORE DINNER Patient taking differently: Take 40 mg by mouth 2 (two) times daily before a meal.  11/25/18  Yes Milus Banister, MD  oxyCODONE-acetaminophen (PERCOCET) 10-325 MG tablet Take 1 tablet by mouth every 6 (six) hours as needed for pain. 12/16/18  Yes Brunetta Jeans, PA-C  potassium chloride (K-DUR) 10 MEQ tablet TAKE 1 TABLET BY MOUTH AT BEDTIME Patient taking differently: Take 10 mEq by mouth at bedtime.  07/21/18  Yes Raiford Noble C, PA-C  SYMBICORT 160-4.5 MCG/ACT inhaler INHALE TWO PUFFS BY MOUTH TWICE DAILY Patient taking differently: Inhale 2 puffs into the lungs 2  (two) times daily.  10/15/18  Yes Tanda Rockers, MD  triamterene-hydrochlorothiazide (IZTIWPY-09) 37.5-25 MG tablet TAKE 1 TABLET BY MOUTH EVERY DAY Patient taking differently: Take 1 tablet by mouth daily.  12/17/18  Yes Brunetta Jeans, PA-C  albuterol [facility]) 108 [phone] Base) MCG/ACT inhaler 2 puffs every 4 hours as needed only  if your can't catch your breath Patient not taking: Reported on 12/28/2018 10/28/18   Brunetta Jeans, PA-C  azithromycin (ZITHROMAX) 250 MG tablet Take 1 tablet (250 mg total) by mouth daily. Take  1 every day until finished. 12/29/18   Isla Pence, MD  OXYGEN Inhale 2 L into the lungs at bedtime.     [provider]  predniSONE (STERAPRED UNI-PAK 21 TAB) 10 MG (21) TBPK tablet Take 6 tabs for 2 days, then 5 for 2 days, then 4 for 2 days, then 3 for 2 days, 2 for 2 days, then 1 for 2 days 12/28/18   Isla Pence, MD    Family History Family History  Problem Relation Age of Onset  . COPD Mother   . Heart disease Mother   . Lung disease Father        Asbestosis  . Heart attack Father   . Heart disease Father   . Cerebral aneurysm Brother   . Aneurysm Brother        Brain  . Epilepsy Son   . Arthritis Maternal Grandmother   . Heart disease Maternal Grandmother   . Asthma Maternal Grandfather   . Cancer Maternal Grandfather   . Arthritis Paternal Grandmother   . Heart disease Paternal Grandmother   . Stroke Paternal Grandmother   . Early death Paternal Grandfather   . Heart disease Paternal Grandfather     Social History Social History   Tobacco Use  . Smoking status: Current Every Day Smoker    Packs/day: 1.50    Years: 46.00    Pack years: 69.00    Types: Cigarettes  . Smokeless tobacco: Never Used  Substance Use Topics  . Alcohol use: No  . Drug use: No     Allergies   Nsaids, Aleve [naproxen sodium], Codeine, Penicillins, and Sulfonamide derivatives   Review of Systems Review of Systems  Constitutional: Positive  for fatigue and fever.  Respiratory: Positive for cough, shortness of breath and  wheezing.   All other systems reviewed and are negative.    Physical Exam Updated Vital Signs BP 126/66   Pulse (!) 103   Temp (!) 100.4 F (38 C) (Oral)   Resp (!) 23   SpO2 93%   Physical Exam Vitals signs and nursing note reviewed.  Constitutional:      Appearance: She is well-developed. She is obese. She is ill-appearing.  HENT:     Head: Normocephalic and atraumatic.     Mouth/Throat:     Mouth: Mucous membranes are moist.     Pharynx: Oropharynx is clear.  Eyes:     Extraocular Movements: Extraocular movements intact.     Pupils: Pupils are equal, round, and reactive to light.  Neck:     Musculoskeletal: Normal range of motion and neck supple.  Cardiovascular:     Rate and Rhythm: Regular rhythm. Tachycardia present.  Pulmonary:     Effort: Tachypnea present.     Breath sounds: Wheezing present.  Abdominal:     General: Bowel sounds are normal.     Palpations: Abdomen is soft.  Musculoskeletal: Normal range of motion.  Skin:    General: Skin is warm and dry.     Capillary Refill: Capillary refill takes less than 2 seconds.  Neurological:     General: No focal deficit present.     Mental Status: She is alert and oriented to person, place, and time.  Psychiatric:        Mood and Affect: Mood normal.        Behavior: Behavior normal.      ED Treatments / Results  Labs (all labs ordered are listed, but only abnormal results are displayed) Labs Reviewed  BASIC METABOLIC PANEL - Abnormal; Notable for the following components:      Result Value   Sodium 133 (*)    Chloride 96 (*)    Glucose, Bld 173 (*)    Creatinine, Ser 1.23 (*)    Calcium 8.0 (*)    GFR calc non Af Amer 47 (*)    GFR calc Af Amer 54 (*)    All other components within normal limits  CBC - Abnormal; Notable for the following components:   WBC 13.8 (*)    Hemoglobin 11.0 (*)    MCHC 29.3 (*)    RDW 20.0  (*)    All other components within normal limits  SARS CORONAVIRUS 2 (TAT 6-24 HRS)  RESPIRATORY PANEL BY PCR  INFLUENZA PANEL BY PCR (TYPE A & B)  URINALYSIS, ROUTINE W REFLEX MICROSCOPIC  POC SARS CORONAVIRUS 2 AG -  ED  POC SARS CORONAVIRUS 2 AG -  ED    EKG EKG Interpretation  Date/Time:  Monday December 28 2018 14:39:03 EST Ventricular Rate:  112 PR Interval:    QRS Duration: 94 QT Interval:  339 QTC Calculation: 463 R Axis:   50 Text Interpretation: Sinus tachycardia Since last tracing rate faster Confirmed by Isla Pence [id number]) on 12/28/2018 4:05:45 PM   Radiology Dg Chest Portable 1 View  Result Date: 12/28/2018 CLINICAL DATA:  Shortness of breath and fever EXAM: PORTABLE CHEST 1 VIEW COMPARISON:  February 26, 2018 FINDINGS: The lungs are clear. Heart size and pulmonary vascularity are normal. No adenopathy. No bone lesions. IMPRESSION: No edema or consolidation. Electronically Signed   By: Lowella Grip III M.D.   On: 12/28/2018 15:04    Procedures Procedures (including critical care time)  Medications Ordered in ED Medications  cefTRIAXone (ROCEPHIN)  1 g in sodium chloride 0.9 % 100 mL IVPB (1 g Intravenous New Bag/Given 12/28/18 1858)  acetaminophen (TYLENOL) tablet 1,000 mg (1,000 mg Oral Given 12/28/18 1524)  methylPREDNISolone sodium succinate (SOLU-MEDROL) 125 mg/2 mL injection 125 mg (125 mg Intravenous Given 12/28/18 1629)  albuterol (VENTOLIN HFA) 108 (90 Base) MCG/ACT inhaler 4 puff (4 puffs Inhalation Given 12/28/18 1629)  AeroChamber Plus Flo-Vu Medium MISC 1 each (1 each Other Given 12/28/18 1629)  azithromycin (ZITHROMAX) tablet 500 mg (500 mg Oral Given 12/28/18 1859)     Initial Impression / Assessment and Plan / ED Course  I have reviewed the triage vital signs and the nursing notes.  Pertinent labs & imaging results that were available during my care of the patient were reviewed by me and considered in my medical decision making (see  chart for details).       Pt's rapid covid test negative.  6 hr covid test sent.  CXR with no pna.  Pt given rocephin/zithromax for bronchitis.  She was offered admission as she did have some hypoxia, but wants to go home.  She said she will monitor her pulse ox.  She is told to self isolate.  Return if worse.  KAMYRAH FEESER was evaluated in Emergency Department on 12/28/2018 for the symptoms described in the history of present illness. She was evaluated in the context of the global COVID-19 pandemic, which necessitated consideration that the patient might be at risk for infection with the SARS-CoV-2 virus that causes COVID-19. Institutional protocols and algorithms that pertain to the evaluation of patients at risk for COVID-19 are in a state of rapid change based on information released by regulatory bodies including the CDC and federal and state organizations. These policies and algorithms were followed during the patient's care in the ED.  Final Clinical Impressions(s) / ED Diagnoses   Final diagnoses:  COPD exacerbation ([place])  Acute bronchitis, unspecified organism  Suspected COVID-19 virus infection    ED Discharge Orders         Ordered    azithromycin (ZITHROMAX) 250 MG tablet  Daily     12/28/18 1904    predniSONE (STERAPRED UNI-PAK 21 TAB) 10 MG (21) TBPK tablet     12/28/18 1904           Isla Pence, MD 12/28/18 1905

## 2018-12-28 NOTE — Progress Notes (Signed)
Virtual Visit via Telephone Note  I connected with [name] on 12/28/18 at 11:30 AM EST by telephone and verified that I am speaking with the correct person using two identifiers.  Location: Patient: Home Provider: [facility]   I discussed the limitations, risks, security and privacy concerns of performing an evaluation and management service by telephone and the availability of in person appointments. I also discussed with the patient that there may be a patient responsible charge related to this service. The patient expressed understanding and agreed to proceed.  History of Present Illness: Chronic pain -- Cymbalta increased to 80 mg QD, tolerating the medication well, has noticed a significant improvement to her pain level.   URI symptoms-- Endorses productive cough (green, gray sputum) and headache with sinus pressure over the past 2-3 days. Had dental work performed on Thursday, took Doxycylcine prior to procedure. Notes feeling more short of breath than usual. Denies increase in chest tightness. Notes subjective fever and chills, endorses hallucinations x 2 days described as "seeing a mouse run across the floor". Denies nausea, vomiting, diarrhea. No known sick contacts.    Observations/Objective: Resting at home.  No labored breathing. Audible wheeze noted. Able to complete sentences. Speech is clear and coherent with logical content.  Patient is alert and oriented at baseline.    Assessment and Plan: 1. Hypoxia 2. Suspected COVID-19 virus infection Home O2 at 85% (at rest) with classic symptoms of COVID with concern for pneumonia. Febrile, hypoxic (mildly) with visual hallucinations. Is A/O at time of call. Needs ER evaluation. Declines EMS. Husband taking her to ER ASAP for evaluation and management.    Follow Up Instructions: I discussed the assessment and treatment plan with the patient. The patient was provided an opportunity to ask questions and all were answered. The  patient agreed with the plan and demonstrated an understanding of the instructions.   The patient was advised to call back or seek an in-person evaluation if the symptoms worsen or if the condition fails to improve as anticipated.  I provided 25 minutes of non-face-to-face time during this encounter.   [name], PA-C

## 2018-12-29 ENCOUNTER — Telehealth: Payer: Self-pay | Admitting: Physician Assistant

## 2018-12-29 LAB — RESPIRATORY PANEL BY PCR

## 2018-12-29 LAB — SARS CORONAVIRUS 2 (TAT 6-24 HRS): SARS Coronavirus 2: NEGATIVE

## 2018-12-29 NOTE — Telephone Encounter (Signed)
[facility] called  Pt went to the ER yesterday and was given Arrow chamber pro inhaler. They gave her no instructions on how to use it or how often  Please call pt at [phone]

## 2018-12-30 NOTE — Telephone Encounter (Signed)
Patient is aware of how to use the spacer attached to inhaler. Also aware that she is not COVID +

## 2018-12-31 ENCOUNTER — Ambulatory Visit (INDEPENDENT_AMBULATORY_CARE_PROVIDER_SITE_OTHER): Payer: Medicare Other | Admitting: Physician Assistant

## 2018-12-31 ENCOUNTER — Encounter: Payer: Self-pay | Admitting: Physician Assistant

## 2018-12-31 ENCOUNTER — Other Ambulatory Visit: Payer: Self-pay

## 2018-12-31 DIAGNOSIS — J441 Chronic obstructive pulmonary disease with (acute) exacerbation: Secondary | ICD-10-CM | POA: Diagnosis not present

## 2018-12-31 NOTE — Progress Notes (Signed)
I have discussed the procedure for the virtual visit with the patient who has given consent to proceed with assessment and treatment.   [name], CMA     

## 2018-12-31 NOTE — Progress Notes (Signed)
Virtual Visit via Telephone Note  I connected with [name] on 12/31/18 at 11:00 AM EST by telephone and verified that I am speaking with the correct person using two identifiers.  Location: Patient: Home Provider: [facility]   I discussed the limitations, risks, security and privacy concerns of performing an evaluation and management service by telephone and the availability of in person appointments. I also discussed with the patient that there may be a patient responsible charge related to this service. The patient expressed understanding and agreed to proceed.  History of Present Illness: Patient presents today for ER follow-up. Patient seen virtually by this provider on 12/28/2018 at which time she was noted to be SOB, hypoxic and endorsed some visual hallucinations. Was triaged to ER. ER workup included rapid COVID test (negative), 6-hr COVID test sent (negative), negative CXR, negative respiratory virus panel, EKG with sinus tachycardia at rate of 112 bpm. Creatinine mildly elevated. Was given IV Rocephin, Solumedrol and Albuterol with improvement in breathing. Still had some hypoxia with ambulation and admission recommended but patient declined. Discharged with a diagnosis of COPD exacerbation and oral Azithromycin and prednisone to complete.   Since discharge, patient endorses feeling better. States she has been having a little bit of wheezing with a difficult time catching her breath, typically occurs when she is moving around. Had an episode last night with difficulty catching her breath, required use of Albuterol. Notes worsening of cough, is experiencing episodes of stress incontinence with the cough. Cough is productive with thick green-gray sputum, worse during the day. Denies SOB currently. Taking Azithromycin and Prednisone as directed. Notes using Symbicort BID and Albuterol one puff QD. Denies chest pain or tightness, dizziness, lightheadedness. No longer experiencing visual  hallucinations. Using 2L O2 at night.   Observations/Objective: Patient is in no acute distress.  Resting comfortably at home.  No labored breathing.  Speech is clear and coherent with logical content.  Patient is alert and oriented at baseline.   Assessment and Plan: 1. COPD exacerbation ([place]) Improved. Afebrile. Good home oxygenation. Complete entire course of steroid and antibiotic. Continue chronic COPD medication. Keep hydrated and get plenty of rest. Close follow-up -- will call Monday. Strict ER precautions reviewed.    Follow Up Instructions: I discussed the assessment and treatment plan with the patient. The patient was provided an opportunity to ask questions and all were answered. The patient agreed with the plan and demonstrated an understanding of the instructions.   The patient was advised to call back or seek an in-person evaluation if the symptoms worsen or if the condition fails to improve as anticipated.  I provided 20 minutes of non-face-to-face time during this encounter.   [name], PA-C

## 2018-12-31 NOTE — Patient Instructions (Signed)
Instructions sent to MyChart.   Keep well-hydrated and get plenty of rest.  Finish the entire course of antibiotic and prednisone. Continue your inhalers and oxygen at night Symptoms should continue to improve until resolved.  I will call you Monday morning to check in on you. Once resolved we want to get you in to recheck your kidney function to make sure it has returned to normal.   If anything worsens, especially over the weekend, you need to be seen at nearest Urgent Care or ER facility.  Feel better!

## 2019-01-04 ENCOUNTER — Ambulatory Visit (INDEPENDENT_AMBULATORY_CARE_PROVIDER_SITE_OTHER): Payer: Medicare Other | Admitting: Adult Health

## 2019-01-04 ENCOUNTER — Encounter: Payer: Self-pay | Admitting: Adult Health

## 2019-01-04 DIAGNOSIS — F17210 Nicotine dependence, cigarettes, uncomplicated: Secondary | ICD-10-CM | POA: Diagnosis not present

## 2019-01-04 DIAGNOSIS — J441 Chronic obstructive pulmonary disease with (acute) exacerbation: Secondary | ICD-10-CM

## 2019-01-04 DIAGNOSIS — J9611 Chronic respiratory failure with hypoxia: Secondary | ICD-10-CM | POA: Diagnosis not present

## 2019-01-04 MED ORDER — SPIRIVA RESPIMAT 2.5 MCG/ACT IN AERS: 2.0000 | INHALATION_SPRAY | Freq: Every day | RESPIRATORY_TRACT | 5 refills | Status: DC

## 2019-01-04 NOTE — Progress Notes (Signed)
Virtual Visit via Telephone Note  I connected with [name] on 01/04/19 at  3:30 PM EST by telephone and verified that I am speaking with the correct person using two identifiers.  Location: Patient: Home Provider: Office   I discussed the limitations, risks, security and privacy concerns of performing an evaluation and management service by telephone and the availability of in person appointments. I also discussed with the patient that there may be a patient responsible charge related to this service. The patient expressed understanding and agreed to proceed.   History of Present Illness: 63 year old female active smoker seen for initial pulmonary consult February 2019 found to have gold 2 COPD with reversibility.  She is oxygen dependent on 2 L of oxygen at bedtime. She is disabled from orthopedic issues. Medical history significant for dyslipidemia, hypertension and anemia  Today's televisit is an acute office visit.  Patient complains of 1 week of cough , fever .  congestion , and  dyspnea . Was noted to have low oxygen level .  Seen in ER on 12/28/18 , COVID 19 was neg. Chest xray was clear. She was given Zpack and Prednisone taper. She says she has a few days left of her prednisone.   She says that she is starting to feel better.  Cough and congestion have decreased.  She is had no further fevers.  Appetite remains low but she is eating and drinking.  No nausea vomiting or diarrhea.  Patient has moderate COPD remains on Symbicort and Incruse.  Patient continues to smoke.  Smoking cessation was discussed.  Patient has had a stressful year as her daughter passed away earlier this year.  Needs to change Incruse to Spiriva due to insurance formulary .   She remains on oxygen 2 L at bedtime.   Observations/Objective: Spirometry 2/19/2019FEV1 1.54 (66%) Ratio 75 with min curvature p am symb - Allergy profile 03/18/17 >Eos 0.3 / IgE 4 neg RAST  - PFT's 4/29/2019FEV1 1.60  (63 % ) ratio 67 p 14 % improvement from saba p sym/ spriva prior to study with DLCO 55 % corrects to 63 % for alv volume with EOS up to 0.8 HRCT chest 12/24/2017-no ILD, mild to moderate emphysema Alpha-1 December 05, 2017 normal, MM, 174  Assessment and Plan: Acute COPD exacerbation-slowly resolving Per insurance formulary will need to change Incruse to Spiriva per patient  Oxygen dependent respiratory failure on nocturnal oxygen-O2 goals are greater than 88 to 90%   Plan   Patient Instructions  Finish Prednisone taper .  Continue on Symbicort 2 puffs Twice daily  , rinse after use.  Change INCRUSE to Sprivia 2 puffs daily . Rinse after use .  Restart Oxygen 2l/m At bedtime   Work on not smoking.  Follow up in 6-8 weeks with Dr. Melvyn Novas  Or Ceonna Frazzini NP  Please contact office for sooner follow up if symptoms do not improve or worsen or seek emergency care        Follow Up Instructions:  Follow-up in 6 weeks and as needed  I discussed the assessment and treatment plan with the patient. The patient was provided an opportunity to ask questions and all were answered. The patient agreed with the plan and demonstrated an understanding of the instructions.   The patient was advised to call back or seek an in-person evaluation if the symptoms worsen or if the condition fails to improve as anticipated.  I provided 22 minutes of non-face-to-face time during this encounter.  Rexene Edison, NP

## 2019-01-04 NOTE — Patient Instructions (Addendum)
Finish Prednisone taper .  Continue on Symbicort 2 puffs Twice daily  , rinse after use.  Change INCRUSE to Sprivia 2 puffs daily . Rinse after use .  Restart Oxygen 2l/m At bedtime   Work on not smoking.  Follow up in 6-8 weeks with Dr. Melvyn Novas  Or Anzal Bartnick NP  Please contact office for sooner follow up if symptoms do not improve or worsen or seek emergency care

## 2019-01-04 NOTE — Addendum Note (Signed)
Addended by: Parke Poisson E on: 01/04/2019 04:52 PM   Modules accepted: Orders

## 2019-01-08 ENCOUNTER — Telehealth: Payer: Self-pay

## 2019-01-08 NOTE — Telephone Encounter (Signed)
It appears her Cymbalta was increased on 11/18 and she had a follow up visit on 11/30 during which she said the new dose was working well.  I worry that her confusion and feeling funny (with a history of COPD and recent illness) may be due to the fact that her oxygen is low.  Does she have a way to check this at home?  If not, she needs someone to drive her to Urgent Care or the ER.  If there is no one to drive her, she needs to call EMS

## 2019-01-08 NOTE — Telephone Encounter (Signed)
At this point, she can decrease the Cymbalta dose back to 60mg  daily.  This may be due to her recent prednisone use (b/c this can cause side effects).  If she feels worse over the weekend, she will need to be evaluated.  Otherwise, she can f/u w/ PCP on Monday or Tuesday

## 2019-01-08 NOTE — Telephone Encounter (Signed)
Patient called in stating that she has finished all medication prescribed during her visit to the ER. States that she is "feeling funny." She "can barely remember last week and just feels out of it." Patient states that PCP did change the dosage of Cymbalta to 80 mg during her 11.30.20 E-Visit. Denies taking or stopping any medications. Informed patient that PCP is out of office until Monday, but that I would route message to Dr. Birdie Riddle. Please advise.

## 2019-01-08 NOTE — Telephone Encounter (Signed)
Patient is agreeable to decreasing Cymbalta back to 60 mg. Aware to go to UC if she starts to feel any worse or if O2 drops. Will call back Mon-Tues if she feels she needs an appt with Einar Pheasant.

## 2019-01-08 NOTE — Telephone Encounter (Signed)
Patient states that her O2 is at 93% and O2 tank set at 2

## 2019-01-11 ENCOUNTER — Telehealth: Payer: Self-pay | Admitting: Physician Assistant

## 2019-01-11 NOTE — Telephone Encounter (Signed)
Pt called in stating that the cymbalta is not working for her she wants to start back on the sertraline. Pt also said she was going to request a refill on her Xanax and the oxycodone. Pt can be reached at the home #

## 2019-01-11 NOTE — Telephone Encounter (Signed)
Xanax last rx 12/18/18 #120  Oxycodone-apap last rx 12/16/18 #120  LOV: 12/31/18 COPD exacerbation  CSC: 08/18/17

## 2019-01-12 NOTE — Telephone Encounter (Signed)
Since the Cymbalta helps with pain as well I want to know if she feels the Cymbalta is helping with this but nor mood or not helping with either. We cannot stop medication cold [place] so if wanting to go back to Sertraline we would need to start the Sertraline at a very low dose and slowly go up on this as we wean off of the Cymbalta.

## 2019-01-12 NOTE — Telephone Encounter (Signed)
Patient states that the Cymbalta did not help with either pain or mood. She is agreeable to restarting the Sertraline at a low dose while weaning off the Cymbalta.  Patient also states she has thrush. She went to the dentist on Thursday for new dentures. She is having burning of the throat since then.

## 2019-01-13 ENCOUNTER — Other Ambulatory Visit: Payer: Self-pay | Admitting: Physician Assistant

## 2019-01-13 DIAGNOSIS — G894 Chronic pain syndrome: Secondary | ICD-10-CM

## 2019-01-13 MED ORDER — ALPRAZOLAM 1 MG PO TABS: 1.0000 mg | ORAL_TABLET | Freq: Four times a day (QID) | ORAL | 0 refills | Status: DC | PRN

## 2019-01-13 MED ORDER — OXYCODONE-ACETAMINOPHEN 10-325 MG PO TABS: 1.0000 | ORAL_TABLET | Freq: Four times a day (QID) | ORAL | 0 refills | Status: DC | PRN

## 2019-01-13 NOTE — Telephone Encounter (Signed)
For the Cymbalta -- cut back to 60 mg once daily for 1 more week and then decrease to 30 mg daily for 2 weeks. Ok to send in Rx for 30 mg dose, Quant 14 with O RF.   For the Sertraline -- send in 50 mg tablet. She is to take 1/2 tablet (25 mg) once daily for 2 weeks, then increase to 50 mg daily.   Follow-up with me in 2 weeks via video visit for further changes.   For oral symptoms she needs to either call her dentist or set up appointment so we can take a look and determine most appropriate treatment.   Other med refills are not due until end of the week. I have sent in the refills but she will not be able to pick up until they are actually due.

## 2019-01-13 NOTE — Telephone Encounter (Signed)
LMOVM for patient to call back for directions for weaning down the Cymbalta and starting the Sertraline.

## 2019-01-19 ENCOUNTER — Telehealth: Payer: Self-pay | Admitting: *Deleted

## 2019-01-19 NOTE — Telephone Encounter (Signed)
Pt stated nose is swelling, drainage,  possible sinus infection. Requesting a call back

## 2019-01-20 ENCOUNTER — Other Ambulatory Visit: Payer: Self-pay | Admitting: Physician Assistant

## 2019-01-20 NOTE — Telephone Encounter (Signed)
Patient having nasal congestion-colored, postnasal drainage, sore throat, sinus pressure and pain and fatigued.  Not using Flonase daily but is taking Claritin daily.  She is ok with OTC medication  She contacted dentist and they will not give anything for the dental work up. She states the dentures hurt to wear.

## 2019-01-20 NOTE — Telephone Encounter (Signed)
FYI  Spoke with patient about medication changes. Patient states she has weaned off the Cymbalta and wants to wait to restart the Sertraline.  She states she is only sad about her daughter. She will let us know after Christmas if she wanted to restart

## 2019-01-20 NOTE — Telephone Encounter (Signed)
Increase fluids. Start saline nasal rinse. Continue Claritin. Restart Flonase. Can consider plain Mucinex to thin congestion. If symptoms are not improving will need video visit.

## 2019-01-20 NOTE — Telephone Encounter (Signed)
Advised patient of PCP recommendations. She is agreeable and will restart the Flonase, Continue the Claritin and start a saline nasal rinse. If symptoms do not improve to call to schedule an appointment Video for assessment

## 2019-02-02 ENCOUNTER — Encounter: Payer: Self-pay | Admitting: Physician Assistant

## 2019-02-04 ENCOUNTER — Telehealth: Payer: Self-pay | Admitting: Physician Assistant

## 2019-02-04 NOTE — Telephone Encounter (Signed)
That would be something that she would need to talk to her daughter's provider about since her daughter was not a patient with our practice. She should also be able to talk to the medical examiner who performed the autopsy and they will be able to help discuss/explain

## 2019-02-04 NOTE — Telephone Encounter (Signed)
Pt called in asking if Einar Pheasant will be willing to go over her late daughter's autopsy and toxicology report with her. Pt can be reached at the home # or the cell #

## 2019-02-09 NOTE — Telephone Encounter (Signed)
Pt notified PCP recommendation

## 2019-02-15 ENCOUNTER — Ambulatory Visit: Payer: Medicare Other | Admitting: Adult Health

## 2019-02-15 ENCOUNTER — Other Ambulatory Visit: Payer: Self-pay | Admitting: Physician Assistant

## 2019-02-15 ENCOUNTER — Other Ambulatory Visit: Payer: Self-pay

## 2019-02-15 ENCOUNTER — Telehealth: Payer: Self-pay | Admitting: Adult Health

## 2019-02-15 DIAGNOSIS — E785 Hyperlipidemia, unspecified: Secondary | ICD-10-CM

## 2019-02-15 NOTE — Telephone Encounter (Signed)
Patient was scheduled for 1400 televisit with TP 02/15/19  LMOM TCB x3 Asked patient to please call the office to [facility] Appt changed to a noshow

## 2019-02-16 ENCOUNTER — Other Ambulatory Visit: Payer: Self-pay

## 2019-02-16 ENCOUNTER — Encounter: Payer: Self-pay | Admitting: Physician Assistant

## 2019-02-16 ENCOUNTER — Ambulatory Visit (INDEPENDENT_AMBULATORY_CARE_PROVIDER_SITE_OTHER): Payer: Medicare Other | Admitting: Physician Assistant

## 2019-02-16 VITALS — HR 99

## 2019-02-16 DIAGNOSIS — L03119 Cellulitis of unspecified part of limb: Secondary | ICD-10-CM | POA: Diagnosis not present

## 2019-02-16 DIAGNOSIS — L299 Pruritus, unspecified: Secondary | ICD-10-CM

## 2019-02-16 DIAGNOSIS — G894 Chronic pain syndrome: Secondary | ICD-10-CM

## 2019-02-16 DIAGNOSIS — R609 Edema, unspecified: Secondary | ICD-10-CM

## 2019-02-16 MED ORDER — FUROSEMIDE 20 MG PO TABS: 20.0000 mg | ORAL_TABLET | Freq: Every day | ORAL | 3 refills | Status: DC

## 2019-02-16 MED ORDER — DULOXETINE HCL 30 MG PO CPEP: 30.0000 mg | ORAL_CAPSULE | Freq: Every day | ORAL | 1 refills | Status: DC

## 2019-02-16 MED ORDER — DOXYCYCLINE HYCLATE 100 MG PO TABS: 100.0000 mg | ORAL_TABLET | Freq: Two times a day (BID) | ORAL | 0 refills | Status: DC

## 2019-02-16 MED ORDER — HYDROXYZINE HCL 10 MG PO TABS: 10.0000 mg | ORAL_TABLET | Freq: Three times a day (TID) | ORAL | 0 refills | Status: DC | PRN

## 2019-02-16 NOTE — Progress Notes (Signed)
I have discussed the procedure for the virtual visit with the patient who has given consent to proceed with assessment and treatment.   Fritz Pickerel, CMA

## 2019-02-16 NOTE — Progress Notes (Signed)
Virtual Visit via Video   I connected with patient on 02/16/19 at  1:00 PM EST by a video enabled telemedicine application and verified that I am speaking with the correct person using two identifiers.  Location patient: Home Location provider: [facility], Office Persons participating in the virtual visit: Patient, Provider, [place] Denita Lung)  I discussed the limitations of evaluation and management by telemedicine and the availability of in person appointments. The patient expressed understanding and agreed to proceed.  Subjective:   HPI:   Patient presents via Doxy.Me today c/o 1 week of swelling of legs bilaterally from knee down with erythema and tenderness of skin. Denies weeping from the area. Notes hot sensation in the area. Endorses they seem sunburned to her. Denies any PND, orthopnea. Denies change in diet or hydration. Is mainly sedentary. Notes itching of her legs and lower back. Denies rash. Denies change to soaps, lotions or detergents.   ROS:   See pertinent positives and negatives per HPI.  Patient Active Problem List   Diagnosis Date Noted  . Tremor 11/05/2018  . Chronic respiratory failure with hypoxia ([place]) 01/12/2018  . GERD with esophagitis 11/26/2017  . Dysphagia 08/20/2017  . Chronic pain syndrome 08/04/2017  . Comorbid sleep-related hypoventilation 08/04/2017  . OSA (obstructive sleep apnea) 08/04/2017  . Hepatic steatosis 08/04/2017  . Polyp of ascending colon   . Diverticulosis of colon without hemorrhage   . Gastritis and gastroduodenitis   . Hypothyroidism 07/22/2017  . Anxiety and depression 07/22/2017  . Iron deficiency anemia 07/22/2017  . Pilar cyst 07/22/2017  . Morbid obesity due to excess calories ([place]) 03/19/2017  . Anemia 03/19/2017  . COPD GOLD II still smoking  03/18/2017  . Degenerative lumbar spinal stenosis 02/17/2017  . S/P right hip revision 09/02/2016  . S/P hip replacement 09/02/2016  . Instability of  prosthetic hip ([place]) 03/01/2016  . Dislocation of hip prosthesis ([place]) 02/17/2016  . Abnormal nuclear stress test: Intermediate Risk 10/31/2015  . Atypical angina ([place]) 10/31/2015  . Trigger finger, acquired 08/20/2012  . Cigarette smoker 12/12/2008  . Hyperlipidemia 11/17/2008  . HYPERTENSION, BENIGN 11/16/2008  . GEN OSTEOARTHROSIS INVOLVING MULTIPLE SITES 11/16/2008    Social History   Tobacco Use  . Smoking status: Current Every Day Smoker    Packs/day: 1.50    Years: 46.00    Pack years: 69.00    Types: Cigarettes  . Smokeless tobacco: Never Used  Substance Use Topics  . Alcohol use: No    Current Outpatient Medications:  .  albuterol (PROAIR HFA) 108 (90 Base) MCG/ACT inhaler, 2 puffs every 4 hours as needed only  if your can't catch your breath, Disp: 6.7 g, Rfl: 0 .  albuterol (VENTOLIN HFA) 108 (90 Base) MCG/ACT inhaler, Inhale 2 puffs into the lungs every 4 (four) hours as needed for wheezing or shortness of breath., Disp: 8 g, Rfl: 1 .  ALPRAZolam (XANAX) 1 MG tablet, Take 1 tablet (1 mg total) by mouth 4 (four) times daily as needed. for anxiety, Disp: 120 tablet, Rfl: 0 .  atorvastatin (LIPITOR) 20 MG tablet, TAKE 1 TABLET BY MOUTH AT BEDTIME, Disp: 90 tablet, Rfl: 1 .  DULoxetine (CYMBALTA) 60 MG capsule, TAKE 1 CAPSULE BY MOUTH EVERY DAY (Patient taking differently: Take 60 mg by mouth daily. ), Disp: 90 capsule, Rfl: 1 .  famotidine (PEPCID) 20 MG tablet, Take 1 tablet (20 mg total) by mouth 2 (two) times daily., Disp: 60 tablet, Rfl: 11 .  fluticasone (FLONASE)  50 MCG/ACT nasal spray, Place 2 sprays into both nostrils daily., Disp: , Rfl:  .  gabapentin (NEURONTIN) 300 MG capsule, Take 600 mg by mouth 2 (two) times daily. , Disp: , Rfl:  .  levothyroxine (SYNTHROID) 75 MCG tablet, TAKE 1 TABLET BY MOUTH DAILY BEFORE BREAKFAST (Patient taking differently: Take 75 mcg by mouth daily before breakfast. ), Disp: 90 tablet, Rfl: 1 .  loratadine (CLARITIN) 10 MG tablet,  Take 10 mg by mouth daily., Disp: , Rfl:  .  meloxicam (MOBIC) 15 MG tablet, Take 15 mg by mouth daily., Disp: , Rfl:  .  nystatin (MYCOSTATIN/NYSTOP) powder, Apply topically 2 (two) times daily. (Patient taking differently: Apply 1 Bottle topically 2 (two) times daily. ), Disp: 15 g, Rfl: 0 .  omeprazole (PRILOSEC) 40 MG capsule, TAKE 1 CAPSULE BY MOUTH BEFORE BREAKFAST AND TAKE 1 CAPSULE BY MOUTH BEFORE DINNER (Patient taking differently: Take 40 mg by mouth 2 (two) times daily before a meal. ), Disp: 60 capsule, Rfl: 3 .  oxyCODONE-acetaminophen (PERCOCET) 10-325 MG tablet, Take 1 tablet by mouth every 6 (six) hours as needed for pain., Disp: 120 tablet, Rfl: 0 .  OXYGEN, Inhale 2 L into the lungs at bedtime. , Disp: , Rfl:  .  potassium chloride (KLOR-CON) 10 MEQ tablet, TAKE 1 TABLET BY MOUTH AT BEDTIME, Disp: 90 tablet, Rfl: 1 .  predniSONE (STERAPRED UNI-PAK 21 TAB) 10 MG (21) TBPK tablet, Take 6 tabs for 2 days, then 5 for 2 days, then 4 for 2 days, then 3 for 2 days, 2 for 2 days, then 1 for 2 days, Disp: 42 tablet, Rfl: 0 .  SYMBICORT 160-4.5 MCG/ACT inhaler, INHALE TWO PUFFS BY MOUTH TWICE DAILY (Patient taking differently: Inhale 2 puffs into the lungs 2 (two) times daily. ), Disp: 10.2 g, Rfl: 5 .  Tiotropium Bromide Monohydrate (SPIRIVA RESPIMAT) 2.5 MCG/ACT AERS, Inhale 2 puffs into the lungs daily., Disp: 4 g, Rfl: 5 .  triamterene-hydrochlorothiazide (MAXZIDE-25) 37.5-25 MG tablet, TAKE 1 TABLET BY MOUTH EVERY DAY (Patient taking differently: Take 1 tablet by mouth daily. ), Disp: 90 tablet, Rfl: 1  Allergies  Allergen Reactions  . Nsaids Diarrhea  . Aleve [Naproxen Sodium] Other (See Comments)    Headache   . Codeine Nausea Only and Other (See Comments)    GI upset  . Penicillins Nausea Only and Other (See Comments)    GI upset Has patient had a PCN reaction causing immediate rash, facial/tongue/throat swelling, SOB or lightheadedness with hypotension: No Has patient had a  PCN reaction causing severe rash involving mucus membranes or skin necrosis: No Has patient had a PCN reaction that required hospitalization No Has patient had a PCN reaction occurring within the last 10 years: No If all of the above answers are "NO", then may proceed with Cephalosporin use.   . Sulfonamide Derivatives Hives    Objective:   There were no vitals taken for this visit.  Patient is well-developed, well-nourished in no acute distress.  Resting comfortably at home.  Head is normocephalic, atraumatic.  No labored breathing.  Speech is clear and coherent with logical content.  Patient is alert and oriented at baseline.   Assessment and Plan:   1. Peripheral edema 2. Cellulitis of lower extremity, unspecified laterality Without SOB, PND, orthopnea. Elevation of legs and compression stocking recommended. Start Lasix 20 mg once daily. DASH diet reviewed. Concern for cellulitis. Will start Doxycycline. Patient to come in for labs. - doxycycline (VIBRA-TABS) 100 MG  tablet; Take 1 tablet (100 mg total) by mouth 2 (two) times daily.  Dispense: 14 tablet; Refill: 0 - furosemide (LASIX) 20 MG tablet; Take 1 tablet (20 mg total) by mouth daily.  Dispense: 30 tablet; Refill: 3  3. Pruritus Unclear etiology. Will bring her in for CBC, CMP and TSH. Start Hydroxyzine. OTC medications reviewed.  - hydrOXYzine (ATARAX/VISTARIL) 10 MG tablet; Take 1 tablet (10 mg total) by mouth 3 (three) times daily as needed.  Dispense: 30 tablet; Refill: 0 - Comprehensive metabolic panel; Future - TSH; Future - CBC with Differential/Platelet; Future  4. Chronic pain syndrome Due for refill. UTD on follow-up. PDMP review UTD. Refills sent.  - oxyCODONE-acetaminophen (PERCOCET) 10-325 MG tablet; Take 1 tablet by mouth every 6 (six) hours as needed for pain.  Dispense: 120 tablet; Refill: 0    [name], [place] 02/16/2019

## 2019-02-17 ENCOUNTER — Ambulatory Visit: Payer: Medicare Other

## 2019-02-18 ENCOUNTER — Other Ambulatory Visit: Payer: Self-pay

## 2019-02-18 ENCOUNTER — Ambulatory Visit (INDEPENDENT_AMBULATORY_CARE_PROVIDER_SITE_OTHER): Payer: Medicare Other

## 2019-02-18 DIAGNOSIS — L299 Pruritus, unspecified: Secondary | ICD-10-CM

## 2019-02-18 LAB — COMPREHENSIVE METABOLIC PANEL
ALT: 23 U/L (ref 0–35)
AST: 41 U/L — ABNORMAL HIGH (ref 0–37)
Albumin: 3.3 g/dL — ABNORMAL LOW (ref 3.5–5.2)
Alkaline Phosphatase: 91 U/L (ref 39–117)
BUN: 10 mg/dL (ref 6–23)
CO2: 30 mEq/L (ref 19–32)
Calcium: 8.1 mg/dL — ABNORMAL LOW (ref 8.4–10.5)
Chloride: 99 mEq/L (ref 96–112)
Creatinine, Ser: 0.91 mg/dL (ref 0.40–1.20)
GFR: 62.43 mL/min (ref >60.00)
Glucose, Bld: 222 mg/dL — ABNORMAL HIGH (ref 70–99)
Potassium: 3.8 mEq/L (ref 3.5–5.1)
Sodium: 136 mEq/L (ref 135–145)
Total Bilirubin: 0.6 mg/dL (ref 0.2–1.2)
Total Protein: 6.4 g/dL (ref 6.0–8.3)

## 2019-02-18 LAB — CBC WITH DIFFERENTIAL/PLATELET
Basophils Absolute: 0.1 10*3/uL (ref 0.0–0.1)
Basophils Relative: 0.7 % (ref 0.0–3.0)
Eosinophils Absolute: 0.4 10*3/uL (ref 0.0–0.7)
Eosinophils Relative: 5.2 % — ABNORMAL HIGH (ref 0.0–5.0)
HCT: 35.8 % — ABNORMAL LOW (ref 36.0–46.0)
Hemoglobin: 11.3 g/dL — ABNORMAL LOW (ref 12.0–15.0)
Lymphocytes Relative: 35.3 % (ref 12.0–46.0)
Lymphs Abs: 2.5 10*3/uL (ref 0.7–4.0)
MCHC: 31.5 g/dL (ref 30.0–36.0)
MCV: 88.4 fl (ref 78.0–100.0)
Monocytes Absolute: 0.5 10*3/uL (ref 0.1–1.0)
Monocytes Relative: 6.9 % (ref 3.0–12.0)
Neutro Abs: 3.7 10*3/uL (ref 1.4–7.7)
Neutrophils Relative %: 51.9 % (ref 43.0–77.0)
Platelets: 193 10*3/uL (ref 150.0–400.0)
RBC: 4.05 Mil/uL (ref 3.87–5.11)
RDW: 20.3 % — ABNORMAL HIGH (ref 11.5–15.5)
WBC: 7.2 10*3/uL (ref 4.0–10.5)

## 2019-02-18 LAB — TSH: TSH: 6.22 u[IU]/mL — ABNORMAL HIGH (ref 0.35–4.50)

## 2019-02-18 MED ORDER — OXYCODONE-ACETAMINOPHEN 10-325 MG PO TABS: 1.0000 | ORAL_TABLET | Freq: Four times a day (QID) | ORAL | 0 refills | Status: DC | PRN

## 2019-02-18 MED ORDER — ALPRAZOLAM 1 MG PO TABS: 1.0000 mg | ORAL_TABLET | Freq: Four times a day (QID) | ORAL | 0 refills | Status: DC | PRN

## 2019-02-19 ENCOUNTER — Other Ambulatory Visit (INDEPENDENT_AMBULATORY_CARE_PROVIDER_SITE_OTHER): Payer: Medicare Other

## 2019-02-19 DIAGNOSIS — R7989 Other specified abnormal findings of blood chemistry: Secondary | ICD-10-CM

## 2019-02-19 LAB — T4, FREE: Free T4: 0.81 ng/dL (ref 0.60–1.60)

## 2019-02-19 LAB — T3, FREE: T3, Free: 2.7 pg/mL (ref 2.3–4.2)

## 2019-02-23 ENCOUNTER — Telehealth: Payer: Self-pay | Admitting: Physician Assistant

## 2019-02-23 DIAGNOSIS — L299 Pruritus, unspecified: Secondary | ICD-10-CM

## 2019-02-23 DIAGNOSIS — R609 Edema, unspecified: Secondary | ICD-10-CM

## 2019-02-23 NOTE — Telephone Encounter (Signed)
Please assess further. Any further spread? New skin changes, etc? Would need Dermatology referral at this point -- ok to place.

## 2019-02-23 NOTE — Telephone Encounter (Signed)
Pt is still itching, states she is no better. Taking benadryl and hydroxyzine and very unclear it other new meds were added to address the itching.

## 2019-02-23 NOTE — Telephone Encounter (Signed)
Left message on VM for patient to call back regarding her itching and lab results

## 2019-02-24 ENCOUNTER — Other Ambulatory Visit: Payer: Self-pay | Admitting: Emergency Medicine

## 2019-02-24 DIAGNOSIS — E039 Hypothyroidism, unspecified: Secondary | ICD-10-CM

## 2019-02-24 DIAGNOSIS — R7989 Other specified abnormal findings of blood chemistry: Secondary | ICD-10-CM

## 2019-02-24 DIAGNOSIS — R748 Abnormal levels of other serum enzymes: Secondary | ICD-10-CM

## 2019-02-24 MED ORDER — HYDROXYZINE HCL 25 MG PO TABS: 25.0000 mg | ORAL_TABLET | Freq: Three times a day (TID) | ORAL | 0 refills | Status: DC | PRN

## 2019-02-24 MED ORDER — FUROSEMIDE 20 MG PO TABS: 40.0000 mg | ORAL_TABLET | Freq: Every day | ORAL | 3 refills | Status: DC

## 2019-02-24 NOTE — Telephone Encounter (Signed)
Spoke with patient about the itching. She states she is still itching. Not much improvements in the itching. She is taking the Benadryl and Hydroxyzine as directed. The areas of itching are the same.  She is still having swelling of the legs. She states her ankles and calf swelling are worst.

## 2019-02-24 NOTE — Telephone Encounter (Signed)
Patient is agreeable with increasing the Hydroxyzine to 25 mg tid. Rx sent to the pharmacy.  She will increase Furosemide to 40 mg (2 tabs daily) for the next 4-5 days. Scheduled an appointment for Monday for video visit. Patient does not have a scale to weight herself.

## 2019-02-24 NOTE — Telephone Encounter (Signed)
For itching -- Ok to increase the Hydroxyzine to 25 mg up to TID. Ok to send in new RX with Quant 30. Sarna lotion OTC. Proceed with dermatology referral.   For swelling -- limit salt. Elevate legs. Increase Furosemide to 40 mg (2 20 mg tablets) over the next 4-5 days.  Start daily weights -- if gaining > 3 pounds overnight or 5 pounds in 2-3 days, needs ER assessment. Otherwise follow-up with me Monday via video visit or in office (ok per me if she passes screening) for further assessment.

## 2019-03-01 ENCOUNTER — Encounter: Payer: Self-pay | Admitting: Physician Assistant

## 2019-03-01 ENCOUNTER — Ambulatory Visit (INDEPENDENT_AMBULATORY_CARE_PROVIDER_SITE_OTHER): Payer: Medicare Other | Admitting: Physician Assistant

## 2019-03-01 ENCOUNTER — Other Ambulatory Visit: Payer: Self-pay

## 2019-03-01 DIAGNOSIS — L299 Pruritus, unspecified: Secondary | ICD-10-CM

## 2019-03-01 DIAGNOSIS — R609 Edema, unspecified: Secondary | ICD-10-CM | POA: Diagnosis not present

## 2019-03-01 NOTE — Progress Notes (Signed)
I have discussed the procedure for the virtual visit with the patient who has given consent to proceed with assessment and treatment.   Correen Bubolz S Stryder Poitra, CMA     

## 2019-03-01 NOTE — Progress Notes (Signed)
Virtual Visit via Telephone Note  I connected with [name] on 03/01/19 at  3:30 PM EST by telephone and verified that I am speaking with the correct person using two identifiers.  Location: Patient: Home Provider:  Primary Care at [facility]   I discussed the limitations, risks, security and privacy concerns of performing an evaluation and management service by telephone and the availability of in person appointments. I also discussed with the patient that there may be a patient responsible charge related to this service. The patient expressed understanding and agreed to proceed.  History of Present Illness: Patient presents via telephone today for follow-up of pruritus and peripheral edema.  Patient attempted to connect via doxy [email or web address] but had technical difficulties.  At last visit patient's dose of furosemide was increased to 40 mg once daily to further help with diuresis.  Endorses taking medication as directed and tolerating well.  Is trying to keep hydrated but limit salt.  Has increase potassium rich foods in her diet.  Does note continued improvement of swelling.  Notes there is significant improvement compared to last visit.  Still mild swelling present.  No noted erythema, induration, weeping or ulceration of skin.  Denies PND or orthopnea.  In regards to pruritus, patient notes this is significantly improved.  Has not completely resolved yet, however.  Is taking hydroxyzine as directed.  Still denies any noted rash.  Is trying to keep skin moisturized.   Observations/Objective: No labored breathing.  Speech is clear and coherent with logical content.  Patient is alert and oriented at baseline.   Assessment and Plan: 1. Pruritus Markedly improved.  Recent labs unremarkable except for slight elevation in liver enzyme.  We will plan on getting her in later this week to recheck this level to ensure it is back to normal.  Continue hydroxyzine as needed.  Continue  moisturizing lotion and good hydration.  Recommend humidifier in bedroom.  2. Peripheral edema Markedly improved.  Continue furosemide at current dose.  Continue potassium rich foods.  Keep legs elevated while resting.  Keep skin of the legs moisturized.  Plan for an office follow-up on Friday of this week to reassess and update lab work.   Follow Up Instructions:    I discussed the assessment and treatment plan with the patient. The patient was provided an opportunity to ask questions and all were answered. The patient agreed with the plan and demonstrated an understanding of the instructions.   The patient was advised to call back or seek an in-person evaluation if the symptoms worsen or if the condition fails to improve as anticipated.  I provided 18 minutes of non-face-to-face time during this encounter.   [name], PA-C

## 2019-03-01 NOTE — Patient Instructions (Signed)
Instructions sent to MyChart  Please make sure to keep hydrated and limit increased salt intake. Make sure to increase intake of potassium rich foods.Can Continue medications as directed until follow-up on Friday. Make sure to elevate your legs while resting. Compression to the extremities as discussed.  We will follow-up on Friday to reassess.  Call or message me sooner if needed.

## 2019-03-02 ENCOUNTER — Other Ambulatory Visit: Payer: Self-pay | Admitting: Physician Assistant

## 2019-03-05 ENCOUNTER — Other Ambulatory Visit: Payer: Self-pay

## 2019-03-05 ENCOUNTER — Encounter: Payer: Self-pay | Admitting: Physician Assistant

## 2019-03-05 ENCOUNTER — Encounter (HOSPITAL_COMMUNITY): Payer: Self-pay

## 2019-03-05 ENCOUNTER — Emergency Department (HOSPITAL_COMMUNITY)
Admission: EM | Admit: 2019-03-05 | Discharge: 2019-03-05 | Disposition: A | Discharge status: Home / Self Care | Payer: Medicare Other | Attending: Emergency Medicine | Admitting: Emergency Medicine

## 2019-03-05 ENCOUNTER — Emergency Department (HOSPITAL_COMMUNITY): Payer: Medicare Other

## 2019-03-05 ENCOUNTER — Telehealth: Payer: Self-pay | Admitting: Emergency Medicine

## 2019-03-05 ENCOUNTER — Ambulatory Visit (INDEPENDENT_AMBULATORY_CARE_PROVIDER_SITE_OTHER): Payer: Medicare Other | Admitting: Physician Assistant

## 2019-03-05 VITALS — BP 120/62 | HR 110 | Temp 98.3°F | Resp 16 | Ht 62.0 in | Wt 255.0 lb

## 2019-03-05 DIAGNOSIS — L299 Pruritus, unspecified: Secondary | ICD-10-CM

## 2019-03-05 DIAGNOSIS — J449 Chronic obstructive pulmonary disease, unspecified: Secondary | ICD-10-CM | POA: Insufficient documentation

## 2019-03-05 DIAGNOSIS — R5383 Other fatigue: Secondary | ICD-10-CM | POA: Diagnosis not present

## 2019-03-05 DIAGNOSIS — E039 Hypothyroidism, unspecified: Secondary | ICD-10-CM | POA: Diagnosis not present

## 2019-03-05 DIAGNOSIS — F17210 Nicotine dependence, cigarettes, uncomplicated: Secondary | ICD-10-CM | POA: Insufficient documentation

## 2019-03-05 DIAGNOSIS — Z96641 Presence of right artificial hip joint: Secondary | ICD-10-CM | POA: Diagnosis not present

## 2019-03-05 DIAGNOSIS — R748 Abnormal levels of other serum enzymes: Secondary | ICD-10-CM

## 2019-03-05 DIAGNOSIS — Z79899 Other long term (current) drug therapy: Secondary | ICD-10-CM | POA: Insufficient documentation

## 2019-03-05 DIAGNOSIS — R609 Edema, unspecified: Secondary | ICD-10-CM

## 2019-03-05 DIAGNOSIS — E1165 Type 2 diabetes mellitus with hyperglycemia: Secondary | ICD-10-CM | POA: Diagnosis not present

## 2019-03-05 DIAGNOSIS — R7309 Other abnormal glucose: Secondary | ICD-10-CM | POA: Diagnosis present

## 2019-03-05 DIAGNOSIS — E119 Type 2 diabetes mellitus without complications: Secondary | ICD-10-CM | POA: Insufficient documentation

## 2019-03-05 DIAGNOSIS — Z7984 Long term (current) use of oral hypoglycemic drugs: Secondary | ICD-10-CM | POA: Diagnosis not present

## 2019-03-05 DIAGNOSIS — I10 Essential (primary) hypertension: Secondary | ICD-10-CM | POA: Diagnosis not present

## 2019-03-05 LAB — CBC
HCT: 36.9 % (ref 36.0–46.0)
Hemoglobin: 11 g/dL — ABNORMAL LOW (ref 12.0–15.0)
MCH: 27.6 pg (ref 26.0–34.0)
MCHC: 29.8 g/dL — ABNORMAL LOW (ref 30.0–36.0)
MCV: 92.5 fL (ref 80.0–100.0)
Platelets: 197 10*3/uL (ref 150–400)
RBC: 3.99 MIL/uL (ref 3.87–5.11)
RDW: 17.5 % — ABNORMAL HIGH (ref 11.5–15.5)
WBC: 7.9 10*3/uL (ref 4.0–10.5)
nRBC: 0 % (ref 0.0–0.2)

## 2019-03-05 LAB — COMPREHENSIVE METABOLIC PANEL
ALT: 35 U/L (ref 0–35)
AST: 53 U/L — ABNORMAL HIGH (ref 0–37)
Albumin: 3.3 g/dL — ABNORMAL LOW (ref 3.5–5.2)
Alkaline Phosphatase: 116 U/L (ref 39–117)
BUN: 15 mg/dL (ref 6–23)
CO2: 29 mEq/L (ref 19–32)
Calcium: 8.2 mg/dL — ABNORMAL LOW (ref 8.4–10.5)
Chloride: 89 mEq/L — ABNORMAL LOW (ref 96–112)
Creatinine, Ser: 1.2 mg/dL (ref 0.40–1.20)
GFR: 45.36 mL/min — ABNORMAL LOW (ref >60.00)
Glucose, Bld: 723 mg/dL (ref 70–99)
Potassium: 3.8 mEq/L (ref 3.5–5.1)
Sodium: 128 mEq/L — ABNORMAL LOW (ref 135–145)
Total Bilirubin: 0.5 mg/dL (ref 0.2–1.2)
Total Protein: 6.5 g/dL (ref 6.0–8.3)

## 2019-03-05 LAB — URINALYSIS, ROUTINE W REFLEX MICROSCOPIC
Bilirubin Urine: NEGATIVE
Glucose, UA: >=500 mg/dL — AB
Hgb urine dipstick: NEGATIVE
Ketones, ur: NEGATIVE mg/dL
Leukocytes,Ua: NEGATIVE
Nitrite: NEGATIVE
Protein, ur: NEGATIVE mg/dL
Specific Gravity, Urine: 1.029 (ref 1.005–1.030)
pH: 6 (ref 5.0–8.0)

## 2019-03-05 LAB — BASIC METABOLIC PANEL
Anion gap: 10 (ref 5–15)
BUN: 14 mg/dL (ref 8–23)
CO2: 30 mmol/L (ref 22–32)
Calcium: 8.5 mg/dL — ABNORMAL LOW (ref 8.9–10.3)
Chloride: 90 mmol/L — ABNORMAL LOW (ref 98–111)
Creatinine, Ser: 1.32 mg/dL — ABNORMAL HIGH (ref 0.44–1.00)
GFR calc Af Amer: 50 mL/min — ABNORMAL LOW (ref >60)
GFR calc non Af Amer: 43 mL/min — ABNORMAL LOW (ref >60)
Glucose, Bld: 537 mg/dL (ref 70–99)
Potassium: 3.5 mmol/L (ref 3.5–5.1)
Sodium: 130 mmol/L — ABNORMAL LOW (ref 135–145)

## 2019-03-05 LAB — CBG MONITORING, ED
Glucose-Capillary: 560 mg/dL (ref 70–99)
Glucose-Capillary: 445 mg/dL — ABNORMAL HIGH (ref 70–99)

## 2019-03-05 MED ORDER — METFORMIN HCL 1000 MG PO TABS: 500.0000 mg | ORAL_TABLET | Freq: Two times a day (BID) | ORAL | 0 refills | Status: DC

## 2019-03-05 MED ORDER — ONDANSETRON HCL 4 MG/2ML IJ SOLN
4.0000 mg | Freq: Once | INTRAMUSCULAR | Status: AC
  Administered 2019-03-05: 4 mg via INTRAVENOUS
  Filled 2019-03-05: qty 2

## 2019-03-05 MED ORDER — SODIUM CHLORIDE 0.9 % IV BOLUS
1000.0000 mL | Freq: Once | INTRAVENOUS | Status: AC
  Administered 2019-03-05: 1000 mL via INTRAVENOUS

## 2019-03-05 NOTE — ED Triage Notes (Signed)
Pt presents to ED for abnormal labs. Pt had lab work at [facility] labs came back glucose 723, sodium 128, chloride 89. Pt reports feeling fatigue for "a long time, about a month"

## 2019-03-05 NOTE — Telephone Encounter (Signed)
-----   Message from Midge Minium, MD sent at 03/05/2019  4:21 PM EST ----- Given her very high sugars and her low sodium and chloride, she must go to the ER for evaluation and treatment.

## 2019-03-05 NOTE — ED Notes (Signed)
ED Provider at bedside. 

## 2019-03-05 NOTE — ED Provider Notes (Signed)
[facility] EMERGENCY DEPARTMENT Provider Note   CSN: [id number] Arrival date & time: 03/05/19  1812     History Chief Complaint  Patient presents with  . Abnormal Lab    [name] is a 64 y.o. female with PMH of COPD, HTN, HLD, and GERD who presents to the ED for an abnormal lab value obtained by her primary care provider.  Evidently she had a blood sugar obtained of 723 and was subsequently referred here for evaluation.  On my exam, patient endorses a 1 month history of fatigue as well as a 2-week history of increased thirst and urination.  On exam, she feels nauseated.  She spent the day walking around the mall with her friend.  She denies any recent illness, fevers or chills, cough or shortness of breath, chest pain, vomiting, abdominal pain, dysuria, or changes in bowel habits.  HPI     Past Medical History:  Diagnosis Date  . Allergic rhinitis   . Anemia   . Anxiety   . Chicken pox   . Chronic back pain   . COPD (chronic obstructive pulmonary disease) ([place])   . Depression   . Essential hypertension   . GERD (gastroesophageal reflux disease)   . Headache    migraines  . History of gastritis    EGD 2015  . History of home oxygen therapy    2 liters at hs last 6 months  . Hyperlipidemia   . Hypothyroidism   . Migraines   . Osteoarthritis    oa  . Scoliosis     Patient Active Problem List   Diagnosis Date Noted  . Tremor 11/05/2018  . Chronic respiratory failure with hypoxia ([place]) 01/12/2018  . GERD with esophagitis 11/26/2017  . Dysphagia 08/20/2017  . Chronic pain syndrome 08/04/2017  . Comorbid sleep-related hypoventilation 08/04/2017  . OSA (obstructive sleep apnea) 08/04/2017  . Hepatic steatosis 08/04/2017  . Polyp of ascending colon   . Diverticulosis of colon without hemorrhage   . Gastritis and gastroduodenitis   . Hypothyroidism 07/22/2017  . Anxiety and depression 07/22/2017  . Iron deficiency anemia 07/22/2017  . Pilar cyst 07/22/2017    . Morbid obesity due to excess calories ([place]) 03/19/2017  . Anemia 03/19/2017  . COPD GOLD II still smoking  03/18/2017  . Degenerative lumbar spinal stenosis 02/17/2017  . S/P right hip revision 09/02/2016  . S/P hip replacement 09/02/2016  . Instability of prosthetic hip ([place]) 03/01/2016  . Dislocation of hip prosthesis ([place]) 02/17/2016  . Abnormal nuclear stress test: Intermediate Risk 10/31/2015  . Atypical angina ([place]) 10/31/2015  . Trigger finger, acquired 08/20/2012  . Cigarette smoker 12/12/2008  . Hyperlipidemia 11/17/2008  . HYPERTENSION, BENIGN 11/16/2008  . GEN OSTEOARTHROSIS INVOLVING MULTIPLE SITES 11/16/2008    Past Surgical History:  Procedure Laterality Date  . APPENDECTOMY     1985  . BIOPSY  07/24/2017   Procedure: BIOPSY;  Surgeon: Milus Banister, MD;  Location: [facility];  Service: Endoscopy;;  . CARDIAC CATHETERIZATION N/A 10/31/2015   Procedure: Left Heart Cath and Coronary Angiography;  Surgeon: Leonie Man, MD;  Location: [facility] LAB;  Service: Cardiovascular;  Laterality: N/A;  . CARPAL TUNNEL RELEASE Left   . CARPAL TUNNEL RELEASE Right   . CHOLECYSTECTOMY  late 1980's  . COLONOSCOPY WITH PROPOFOL N/A 07/24/2017   Procedure: COLONOSCOPY WITH PROPOFOL;  Surgeon: Milus Banister, MD;  Location: [facility];  Service: Endoscopy;  Laterality: N/A;  . ESOPHAGOGASTRODUODENOSCOPY N/A 07/24/2017  Procedure: ESOPHAGOGASTRODUODENOSCOPY (EGD);  Surgeon: Milus Banister, MD;  Location: [facility];  Service: Endoscopy;  Laterality: N/A;  . GALLBLADDER SURGERY  1991  . HIP CLOSED REDUCTION Right 01/08/2016   Procedure: CLOSED MANIPULATION HIP;  Surgeon: Susa Day, MD;  Location: [facility];  Service: Orthopedics;  Laterality: Right;  . HIP CLOSED REDUCTION Right 01/19/2016   Procedure: ATTEMPTED CLOSED REDUCTION RIGHT HIP;  Surgeon: Wylene Simmer, MD;  Location: [facility];  Service: Orthopedics;  Laterality: Right;  . HIP CLOSED REDUCTION Right  01/20/2016   Procedure: CLOSED REDUCTION RIGHT TOTAL HIP;  Surgeon: Paralee Cancel, MD;  Location: [facility];  Service: Orthopedics;  Laterality: Right;  . HIP CLOSED REDUCTION Right 02/17/2016   Procedure: CLOSED REDUCTION RIGHT TOTAL HIP;  Surgeon: Rod Can, MD;  Location: [place];  Service: Orthopedics;  Laterality: Right;  . HIP CLOSED REDUCTION Right 02/28/2016   Procedure: CLOSED REDUCTION HIP;  Surgeon: Nicholes Stairs, MD;  Location: [facility];  Service: Orthopedics;  Laterality: Right;  . POLYPECTOMY  07/24/2017   Procedure: POLYPECTOMY;  Surgeon: Milus Banister, MD;  Location: [facility];  Service: Endoscopy;;  . TONSILLECTOMY    . TOTAL ABDOMINAL HYSTERECTOMY     1985, with 1 ovary removed and 2 nd ovary removed 2003  . TOTAL HIP ARTHROPLASTY Right    Original surgery 2006 with revision 2010  . TOTAL HIP REVISION Right 01/01/2016   Procedure: TOTAL HIP REVISION;  Surgeon: Paralee Cancel, MD;  Location: [facility];  Service: Orthopedics;  Laterality: Right;  . TOTAL HIP REVISION Right 03/02/2016   Procedure: TOTAL HIP REVISION;  Surgeon: Paralee Cancel, MD;  Location: [facility];  Service: Orthopedics;  Laterality: Right;  . TOTAL HIP REVISION Right 09/02/2016   Procedure: Right hip constrained liner- posterior;  Surgeon: Paralee Cancel, MD;  Location: [facility];  Service: Orthopedics;  Laterality: Right;  . ULNAR NERVE TRANSPOSITION Right      OB History   No obstetric history on file.     Family History  Problem Relation Age of Onset  . COPD Mother   . Heart disease Mother   . Lung disease Father        Asbestosis  . Heart attack Father   . Heart disease Father   . Cerebral aneurysm Brother   . Aneurysm Brother        Brain  . Epilepsy Son   . Arthritis Maternal Grandmother   . Heart disease Maternal Grandmother   . Asthma Maternal Grandfather   . Cancer Maternal Grandfather   . Arthritis Paternal Grandmother   . Heart disease Paternal Grandmother   . Stroke Paternal  Grandmother   . Early death Paternal Grandfather   . Heart disease Paternal Grandfather     Social History   Tobacco Use  . Smoking status: Current Every Day Smoker    Packs/day: 1.50    Years: 46.00    Pack years: 69.00    Types: Cigarettes  . Smokeless tobacco: Never Used  Substance Use Topics  . Alcohol use: No  . Drug use: No    Home Medications Prior to Admission medications   Medication Sig Start Date End Date Taking? Authorizing Provider  albuterol (PROAIR HFA) 108 (90 Base) MCG/ACT inhaler 2 puffs every 4 hours as needed only  if your can't catch your breath 10/28/18   Brunetta Jeans, PA-C  ALPRAZolam Duanne Moron) 1 MG tablet Take 1 tablet (1 mg total) by mouth 4 (four) times daily as needed.  for anxiety 02/18/19   Brunetta Jeans, PA-C  atorvastatin (LIPITOR) 20 MG tablet TAKE 1 TABLET BY MOUTH AT BEDTIME 02/15/19   Brunetta Jeans, PA-C  DULoxetine (CYMBALTA) 30 MG capsule Take 1 capsule (30 mg total) by mouth daily. 02/16/19   Brunetta Jeans, PA-C  fluticasone (FLONASE) 50 MCG/ACT nasal spray Place 2 sprays into both nostrils daily.    [provider]  furosemide (LASIX) 20 MG tablet Take 2 tablets (40 mg total) by mouth daily. 02/24/19   Brunetta Jeans, PA-C  gabapentin (NEURONTIN) 300 MG capsule Take 600 mg by mouth 2 (two) times daily.     [provider]  hydrOXYzine (ATARAX/VISTARIL) 25 MG tablet Take 1 tablet (25 mg total) by mouth 3 (three) times daily as needed. 02/24/19   Brunetta Jeans, PA-C  levothyroxine (SYNTHROID) 75 MCG tablet TAKE 1 TABLET BY MOUTH DAILY BEFORE BREAKFAST Patient taking differently: Take 75 mcg by mouth daily before breakfast.  11/25/18   Brunetta Jeans, PA-C  loratadine (CLARITIN) 10 MG tablet Take 10 mg by mouth daily.    [provider]  meloxicam (MOBIC) 15 MG tablet Take 15 mg by mouth daily. 10/26/18   [provider]  metFORMIN (GLUCOPHAGE) 1000 MG tablet Take 0.5 tablets (500 mg total) by  mouth 2 (two) times daily. 03/05/19   Corena Herter, PA-C  omeprazole (PRILOSEC) 40 MG capsule TAKE 1 CAPSULE BY MOUTH BEFORE BREAKFAST AND TAKE 1 CAPSULE BY MOUTH BEFORE DINNER Patient taking differently: Take 40 mg by mouth 2 (two) times daily before a meal.  11/25/18   Milus Banister, MD  oxyCODONE-acetaminophen (PERCOCET) 10-325 MG tablet Take 1 tablet by mouth every 6 (six) hours as needed for pain. 02/18/19   Brunetta Jeans, PA-C  OXYGEN Inhale 2 L into the lungs at bedtime.     [provider]  potassium chloride (KLOR-CON) 10 MEQ tablet TAKE 1 TABLET BY MOUTH AT BEDTIME 01/20/19   Raiford Noble C, PA-C  SYMBICORT 160-4.5 MCG/ACT inhaler INHALE TWO PUFFS BY MOUTH TWICE DAILY Patient taking differently: Inhale 2 puffs into the lungs 2 (two) times daily.  10/15/18   Tanda Rockers, MD  Tiotropium Bromide Monohydrate (SPIRIVA RESPIMAT) 2.5 MCG/ACT AERS Inhale 2 puffs into the lungs daily. 01/04/19   Parrett, Fonnie Mu, NP  triamterene-hydrochlorothiazide (MAXZIDE-25) 37.5-25 MG tablet TAKE 1 TABLET BY MOUTH EVERY DAY Patient taking differently: Take 1 tablet by mouth daily.  12/17/18   Brunetta Jeans, PA-C    Allergies    Nsaids, Aleve [naproxen sodium], Codeine, Penicillins, and Sulfonamide derivatives  Review of Systems   Review of Systems  Constitutional: Positive for fatigue. Negative for fever.  Respiratory: Negative for shortness of breath.   Cardiovascular: Negative for chest pain.  Gastrointestinal: Positive for nausea.  Endocrine: Positive for polydipsia and polyuria.    Physical Exam Updated Vital Signs BP (!) 123/96   Pulse 90   Temp 98.3 F (36.8 C) (Oral)   Resp 18   Ht 5\' 2"  (1.575 m)   Wt 114.3 kg   SpO2 97%   BMI 46.09 kg/m   Physical Exam Vitals and nursing note reviewed. Exam conducted with a chaperone present.  Constitutional:      Appearance: Normal appearance.  HENT:     Head: Normocephalic and atraumatic.  Eyes:     General: No  scleral icterus.    Conjunctiva/sclera: Conjunctivae normal.  Cardiovascular:     Rate and Rhythm: Normal  rate and regular rhythm.     Pulses: Normal pulses.     Heart sounds: Normal heart sounds.  Pulmonary:     Comments: No increased respiratory effort.  No accessory muscle use.  Wheezing auscultated bilaterally.  No acute distress. Abdominal:     Comments: Soft, nondistended.  Mild TTP in periumbilical region, no TTP elsewhere.  No guarding.  No overlying skin changes.  Skin:    General: Skin is dry.  Neurological:     Mental Status: She is alert and oriented to person, place, and time.     GCS: GCS eye subscore is 4. GCS verbal subscore is 5. GCS motor subscore is 6.  Psychiatric:        Mood and Affect: Mood normal.        Behavior: Behavior normal.        Thought Content: Thought content normal.      ED Results / Procedures / Treatments   Labs (all labs ordered are listed, but only abnormal results are displayed) Labs Reviewed  CBC - Abnormal; Notable for the following components:      Result Value   Hemoglobin 11.0 (*)    MCHC 29.8 (*)    RDW 17.5 (*)    All other components within normal limits  URINALYSIS, ROUTINE W REFLEX MICROSCOPIC - Abnormal; Notable for the following components:   Glucose, UA >=500 (*)    Bacteria, UA RARE (*)    All other components within normal limits  BASIC METABOLIC PANEL - Abnormal; Notable for the following components:   Sodium 130 (*)    Chloride 90 (*)    Glucose, Bld 537 (*)    Creatinine, Ser 1.32 (*)    Calcium 8.5 (*)    GFR calc non Af Amer 43 (*)    GFR calc Af Amer 50 (*)    All other components within normal limits  CBG MONITORING, ED - Abnormal; Notable for the following components:   Glucose-Capillary 560 (*)    All other components within normal limits  CBG MONITORING, ED - Abnormal; Notable for the following components:   Glucose-Capillary 445 (*)    All other components within normal limits  CBG MONITORING, ED     EKG None  Radiology DG Chest Portable 1 View  Result Date: 03/05/2019 CLINICAL DATA:  Elevated glucose.  Fatigue. EXAM: PORTABLE CHEST 1 VIEW COMPARISON:  12/28/2018 FINDINGS: The heart size and mediastinal contours are within normal limits. Both lungs are clear. The visualized skeletal structures are unremarkable. IMPRESSION: No active disease. Electronically Signed   By: Nelson Chimes M.D.   On: 03/05/2019 21:47    Procedures Procedures (including critical care time)  Medications Ordered in ED Medications  sodium chloride 0.9 % bolus 1,000 mL (0 mLs Intravenous Stopped 03/05/19 2230)  ondansetron (ZOFRAN) injection 4 mg (4 mg Intravenous Given 03/05/19 2106)    ED Course  I have reviewed the triage vital signs and the nursing notes.  Pertinent labs & imaging results that were available during my care of the patient were reviewed by me and considered in my medical decision making (see chart for details).    MDM Rules/Calculators/A&P                      Patient's history of fatigue, polydipsia, polyuria in conjunction with her elevated blood glucose here in the ED as well as obtained at her primary care provider's office is collectively suggestive of type 2 diabetes.  Patient will be prescribed a low-carb diet as well as Metformin 500 mg twice daily.  She is instructed to follow-up with her primary care provider regarding today's encounter.  IV fluids given in the ED given her hyponatremia to 130.  Her blood glucose has slowly drifted down to 445.  She does not have an anion gap and I am not concerned metabolic acidosis at this time.  Her white count is normal and her vital signs are within normal limits.  Do not suspect precipitating infection as she is denying any and all other symptoms.  EKG was reassuring.  Discussed with Dr. Eulis Foster who agrees with assessment and plan.  Strict return precautions discussed with the patient.  All of the evaluation and work-up results were discussed  with the patient and any family at bedside. They were provided opportunity to ask any additional questions and have none at this time. They have expressed understanding of verbal discharge instructions as well as return precautions and are agreeable to the plan.    Final Clinical Impression(s) / ED Diagnoses Final diagnoses:  Type 2 diabetes mellitus without complication, without long-term current use of insulin ([place])    Rx / DC Orders ED Discharge Orders         Ordered    metFORMIN (GLUCOPHAGE) 1000 MG tablet  2 times daily     03/05/19 2325           Reita Chard 03/05/19 2325    Daleen Bo, MD 03/06/19 2006

## 2019-03-05 NOTE — Progress Notes (Signed)
Spoke with patient and advised that her glucose level is extremely elevated and low sodium and chloride. Per PCP and MD in the office to go to the ER for evaluation Patient very reluctant to go but agrees to go to [facility] for evaluation.  Advised to tell the ER she has glucose of 723 and she is not diagnosed with diabetes. Patient want results sent to the e-mail so she can remember what to tell the ER.

## 2019-03-05 NOTE — Patient Instructions (Signed)
Please keep up with current regimen for now. Elevate those legs while resting.  Please wear compression stockings.   Tell [name] I told him to help your with moisturizing your skin and with helping you keep legs elevated and compression stockings on.   Please go to the lab today for blood work.  I will call you with your results. We will alter treatment regimen(s) if indicated by your results.   If everything normal I may attempt a trial at increasing your thyroid medication dose to see how you respond.

## 2019-03-05 NOTE — Telephone Encounter (Signed)
Spoke with patient and advised of extremely elevated glucose level per PCP and MD in office she needs to proceed to the ER She was very reluctant to go to the ER. She wanted to wait until tomorrow. I advised this was very important and due to her elevated glucose level can cause other problems. She is agreeable to go to [facility] for evaluation.

## 2019-03-05 NOTE — ED Notes (Signed)
Pt asleep upon RN entry. Pt requesting a coke. Informed not until DR ok'ed pt for PO

## 2019-03-05 NOTE — Discharge Instructions (Signed)
Your lab work and history is suggestive of type 2 diabetes.  Please take the Metformin, as prescribed.  Please read the attachment on nutrition.  It is important that you follow a low carbohydrate diet and exercise regularly.  Please follow-up with your primary care provider regarding today's encounter.  Please return to the ED or seek immediate medical attention for any new or worsening symptoms.

## 2019-03-05 NOTE — ED Notes (Signed)
Pt given ice water per request- pt finished first cup and given second cup of water

## 2019-03-05 NOTE — ED Notes (Signed)
Date and time results received: 03/05/19 8:30 PM (use smartphrase ".now" to insert current time)  Test: glucose Critical Value: 537  Name of Provider Notified: Nyoka Cowden, PA  Orders Received? Or Actions Taken?: see chart

## 2019-03-10 ENCOUNTER — Other Ambulatory Visit: Payer: Self-pay

## 2019-03-10 ENCOUNTER — Ambulatory Visit (INDEPENDENT_AMBULATORY_CARE_PROVIDER_SITE_OTHER): Payer: Medicare Other | Admitting: Physician Assistant

## 2019-03-10 ENCOUNTER — Encounter: Payer: Self-pay | Admitting: Physician Assistant

## 2019-03-10 ENCOUNTER — Ambulatory Visit: Payer: Medicare Other

## 2019-03-10 ENCOUNTER — Telehealth: Payer: Self-pay

## 2019-03-10 DIAGNOSIS — E119 Type 2 diabetes mellitus without complications: Secondary | ICD-10-CM | POA: Diagnosis not present

## 2019-03-10 DIAGNOSIS — E871 Hypo-osmolality and hyponatremia: Secondary | ICD-10-CM

## 2019-03-10 DIAGNOSIS — R7989 Other specified abnormal findings of blood chemistry: Secondary | ICD-10-CM | POA: Diagnosis not present

## 2019-03-10 MED ORDER — METFORMIN HCL ER 500 MG PO TB24: 500.0000 mg | ORAL_TABLET | Freq: Every day | ORAL | 1 refills | Status: DC

## 2019-03-10 MED ORDER — BLOOD GLUCOSE MONITOR KIT: PACK | 0 refills | Status: DC

## 2019-03-10 MED ORDER — ONDANSETRON HCL 4 MG PO TABS: 4.0000 mg | ORAL_TABLET | Freq: Three times a day (TID) | ORAL | 0 refills | Status: DC | PRN

## 2019-03-10 NOTE — Telephone Encounter (Signed)
Zofran and glucometer/supplies sent to pharmacy. [name] with [facility] is aware that patient needs scripts filled ASAP. Patient is aware that she needs her glucose levels checked ASAP, if unable, she needs to be seen at [facility] or ER

## 2019-03-10 NOTE — Telephone Encounter (Signed)
Patient called to report blood sugar reading. She states it was 173 and it was just taken a couple of minutes ago.

## 2019-03-10 NOTE — Telephone Encounter (Signed)
Patient called in stating that her Metformin has been making her extremely nauseous and causing vomiting. States n/v worse in the morning, slowly goes away through out the day. States she does not have a glucometer at home, wanting Einar Pheasant to send script for meter and supplies to pharmacy. Requesting that PCP send in script for Zofran or Phenergan. Advised patient to keep up water/electrolyte intake. Aware appt has been made virtual today, but that she will have to come in either tomorrow or Friday for labs ordered by PCP.

## 2019-03-10 NOTE — Telephone Encounter (Signed)
If she is unable to check glucose levels at present because ER did not give her glucometer, having vomiting when we know she already has electrolyte disturbances and she is unable to get here today for evaluation she needs to be seen at nearest Urgent Care or ER.   I am ok sending in Zofran 4 mg --1 tab every 8 hours quantity 10 with 0 refills. Ok to send in glucometer kit for her with supplies -- she will need her husband to pick this up ASAP and check her glucose so we can determine if video visit appropriate or if she needs ER. If they are unable to go ahead and do this she will need Urgent assessment as discussed above.

## 2019-03-10 NOTE — Patient Instructions (Addendum)
Instructions sent to MyChart.  Please keep well-hydrated. Refrain from sweet tea or sodas. Small amount of black coffee each morning is fine. Please try to eat most of your meals at home. Recommend half of your plate be fresh fruits and vegetables, 1/4 plate a lean protein (chicken, fish -- baked, grilled or roasted and not breaded or fried), and the 1/4 whole grains (brown rice, quinoa, etc).  Check your sugars first thing each morning before first meal. Check sugars in the evening a couple times per week. Write all of these numbers down along with the date and time.  Please call the office tomorrow to let us know how you are feeling and to schedule a lab appointment for tomorrow or Friday.  We will plan on following up next Thursday or Friday in office.

## 2019-03-10 NOTE — Progress Notes (Signed)
Patient presents to clinic today for follow-up of peripheral edema after increase in her furosemide to 40 mg QD. Endorses taking as directed and noting improvement in legs. Have now remained stable after improvement. Notes some worsening in the evenings. Denies PND, orthopnea. Denies ongoing redness, tenderness of the legs. Itching is much improved but still present.   Past Medical History:  Diagnosis Date  . Allergic rhinitis   . Anemia   . Anxiety   . Chicken pox   . Chronic back pain   . COPD (chronic obstructive pulmonary disease) ([place])   . Depression   . Essential hypertension   . GERD (gastroesophageal reflux disease)   . Headache    migraines  . History of gastritis    EGD 2015  . History of home oxygen therapy    2 liters at hs last 6 months  . Hyperlipidemia   . Hypothyroidism   . Migraines   . Osteoarthritis    oa  . Scoliosis     Current Outpatient Medications on File Prior to Visit  Medication Sig Dispense Refill  . albuterol (PROAIR HFA) 108 (90 Base) MCG/ACT inhaler 2 puffs every 4 hours as needed only  if your can't catch your breath 6.7 g 0  . ALPRAZolam (XANAX) 1 MG tablet Take 1 tablet (1 mg total) by mouth 4 (four) times daily as needed. for anxiety 120 tablet 0  . atorvastatin (LIPITOR) 20 MG tablet TAKE 1 TABLET BY MOUTH AT BEDTIME 90 tablet 1  . DULoxetine (CYMBALTA) 30 MG capsule Take 1 capsule (30 mg total) by mouth daily. 14 capsule 1  . fluticasone (FLONASE) 50 MCG/ACT nasal spray Place 2 sprays into both nostrils daily.    . furosemide (LASIX) 20 MG tablet Take 2 tablets (40 mg total) by mouth daily. 30 tablet 3  . gabapentin (NEURONTIN) 300 MG capsule Take 600 mg by mouth 2 (two) times daily.     . hydrOXYzine (ATARAX/VISTARIL) 25 MG tablet Take 1 tablet (25 mg total) by mouth 3 (three) times daily as needed. 30 tablet 0  . levothyroxine (SYNTHROID) 75 MCG tablet TAKE 1 TABLET BY MOUTH DAILY BEFORE BREAKFAST (Patient taking differently: Take 75 mcg  by mouth daily before breakfast. ) 90 tablet 1  . loratadine (CLARITIN) 10 MG tablet Take 10 mg by mouth daily.    . meloxicam (MOBIC) 15 MG tablet Take 15 mg by mouth daily.    Marland Kitchen omeprazole (PRILOSEC) 40 MG capsule TAKE 1 CAPSULE BY MOUTH BEFORE BREAKFAST AND TAKE 1 CAPSULE BY MOUTH BEFORE DINNER (Patient taking differently: Take 40 mg by mouth 2 (two) times daily before a meal. ) 60 capsule 3  . oxyCODONE-acetaminophen (PERCOCET) 10-325 MG tablet Take 1 tablet by mouth every 6 (six) hours as needed for pain. 120 tablet 0  . OXYGEN Inhale 2 L into the lungs at bedtime.     . potassium chloride (KLOR-CON) 10 MEQ tablet TAKE 1 TABLET BY MOUTH AT BEDTIME 90 tablet 1  . SYMBICORT 160-4.5 MCG/ACT inhaler INHALE TWO PUFFS BY MOUTH TWICE DAILY (Patient taking differently: Inhale 2 puffs into the lungs 2 (two) times daily. ) 10.2 g 5  . Tiotropium Bromide Monohydrate (SPIRIVA RESPIMAT) 2.5 MCG/ACT AERS Inhale 2 puffs into the lungs daily. 4 g 5  . triamterene-hydrochlorothiazide (MAXZIDE-25) 37.5-25 MG tablet TAKE 1 TABLET BY MOUTH EVERY DAY (Patient taking differently: Take 1 tablet by mouth daily. ) 90 tablet 1   No current facility-administered medications on file prior  to visit.    Allergies  Allergen Reactions  . Nsaids Diarrhea  . Aleve [Naproxen Sodium] Other (See Comments)    Headache   . Codeine Nausea Only and Other (See Comments)    GI upset  . Penicillins Nausea Only and Other (See Comments)    GI upset Has patient had a PCN reaction causing immediate rash, facial/tongue/throat swelling, SOB or lightheadedness with hypotension: No Has patient had a PCN reaction causing severe rash involving mucus membranes or skin necrosis: No Has patient had a PCN reaction that required hospitalization No Has patient had a PCN reaction occurring within the last 10 years: No If all of the above answers are "NO", then may proceed with Cephalosporin use.   . Sulfonamide Derivatives Hives    Family  History  Problem Relation Age of Onset  . COPD Mother   . Heart disease Mother   . Lung disease Father        Asbestosis  . Heart attack Father   . Heart disease Father   . Cerebral aneurysm Brother   . Aneurysm Brother        Brain  . Epilepsy Son   . Arthritis Maternal Grandmother   . Heart disease Maternal Grandmother   . Asthma Maternal Grandfather   . Cancer Maternal Grandfather   . Arthritis Paternal Grandmother   . Heart disease Paternal Grandmother   . Stroke Paternal Grandmother   . Early death Paternal Grandfather   . Heart disease Paternal Grandfather     Social History   Socioeconomic History  . Marital status: Married    Spouse name: Not on file  . Number of children: Not on file  . Years of education: Not on file  . Highest education level: Not on file  Occupational History  . Not on file  Tobacco Use  . Smoking status: Current Every Day Smoker    Packs/day: 1.50    Years: 46.00    Pack years: 69.00    Types: Cigarettes  . Smokeless tobacco: Never Used  Substance and Sexual Activity  . Alcohol use: No  . Drug use: No  . Sexual activity: Yes  Other Topics Concern  . Not on file  Social History Narrative   Right handed    Caffeine~ 2 cups per day    Lives at home with husband (strained relationship)   Primary caretaker for disabled brother who had aneurism   Daughter died 2018-07-16    Social Determinants of Health   Financial Resource Strain:   . Difficulty of Paying Living Expenses: Not on file  Food Insecurity:   . Worried About Charity fundraiser in the Last Year: Not on file  . Ran Out of Food in the Last Year: Not on file  Transportation Needs:   . Lack of Transportation (Medical): Not on file  . Lack of Transportation (Non-Medical): Not on file  Physical Activity:   . Days of Exercise per Week: Not on file  . Minutes of Exercise per Session: Not on file  Stress:   . Feeling of Stress : Not on file  Social Connections:   .  Frequency of Communication with Friends and Family: Not on file  . Frequency of Social Gatherings with Friends and Family: Not on file  . Attends Religious Services: Not on file  . Active Member of Clubs or Organizations: Not on file  . Attends Archivist Meetings: Not on file  . Marital Status: Not on  file    Review of Systems - See HPI.  All other ROS are negative.  BP 120/62   Pulse (!) 110   Temp 98.3 F (36.8 C) (Temporal)   Resp 16   Ht 5' 2"  (1.575 m)   Wt 255 lb (115.7 kg)   SpO2 95%   BMI 46.64 kg/m   Physical Exam Vitals reviewed.  Constitutional:      Appearance: Normal appearance.  HENT:     Head: Normocephalic and atraumatic.     Mouth/Throat:     Mouth: Mucous membranes are moist.  Eyes:     Conjunctiva/sclera: Conjunctivae normal.     Pupils: Pupils are equal, round, and reactive to light.  Cardiovascular:     Rate and Rhythm: Normal rate and regular rhythm.     Pulses: Normal pulses.          Dorsalis pedis pulses are 2+ on the right side and 2+ on the left side.       Posterior tibial pulses are 2+ on the right side and 2+ on the left side.     Heart sounds: Normal heart sounds.     Comments: Erythema of legs and at last visit has resolved.  No noted weeping or stasis dermatitis.  1+ nonpitting edema present today bilaterally. Pulmonary:     Effort: Pulmonary effort is normal.  Musculoskeletal:     Cervical back: Neck supple.  Neurological:     General: No focal deficit present.     Mental Status: She is alert and oriented to person, place, and time.  Psychiatric:        Mood and Affect: Mood normal.     Recent Results (from the past 2160 hour(s))  PTH, Intact and Calcium     Status: Abnormal   Collection Time: 12/16/18  3:26 PM  Result Value Ref Range   PTH 50 14 - 64 pg/mL    Comment: . Interpretive Guide    Intact PTH           Calcium ------------------    ----------           ------- Normal Parathyroid    Normal                Normal Hypoparathyroidism    Low or Low Normal    Low Hyperparathyroidism    Primary            Normal or High       High    Secondary          High                 Normal or Low    Tertiary           High                 High Non-Parathyroid    Hypercalcemia      Low or Low Normal    High .    Calcium 8.4 (L) 8.6 - 10.4 mg/dL  Magnesium     Status: None   Collection Time: 12/16/18  3:26 PM  Result Value Ref Range   Magnesium 1.5 1.5 - 2.5 mg/dL  Basic metabolic panel     Status: Abnormal   Collection Time: 12/28/18  2:48 PM  Result Value Ref Range   Sodium 133 (L) 135 - 145 mmol/L   Potassium 3.8 3.5 - 5.1 mmol/L   Chloride 96 (L) 98 - 111 mmol/L  CO2 26 22 - 32 mmol/L   Glucose, Bld 173 (H) 70 - 99 mg/dL   BUN 15 8 - 23 mg/dL   Creatinine, Ser 1.23 (H) 0.44 - 1.00 mg/dL   Calcium 8.0 (L) 8.9 - 10.3 mg/dL   GFR calc non Af Amer 47 (L) >60 mL/min   GFR calc Af Amer 54 (L) >60 mL/min   Anion gap 11 5 - 15    Comment: Performed at [facility], [place] [address], [place], [place] [address]  CBC     Status: Abnormal   Collection Time: 12/28/18  2:48 PM  Result Value Ref Range   WBC 13.8 (H) 4.0 - 10.5 K/uL   RBC 4.12 3.87 - 5.11 MIL/uL   Hemoglobin 11.0 (L) 12.0 - 15.0 g/dL   HCT 37.5 36.0 - 46.0 %   MCV 91.0 80.0 - 100.0 fL   MCH 26.7 26.0 - 34.0 pg   MCHC 29.3 (L) 30.0 - 36.0 g/dL   RDW 20.0 (H) 11.5 - 15.5 %   Platelets 206 150 - 400 K/uL   nRBC 0.0 0.0 - 0.2 %    Comment: Performed at [facility], [place] [address] [place], [place] [address]  POC SARS Coronavirus 2 Ag-ED - Nasal Swab (BD Veritor Kit)     Status: None   Collection Time: 12/28/18  4:58 PM  Result Value Ref Range   SARS Coronavirus 2 Ag NEGATIVE NEGATIVE    Comment: (NOTE) SARS-CoV-2 antigen NOT DETECTED.  Negative results are presumptive.  Negative results do not preclude SARS-CoV-2 infection and should not be used as the sole basis for treatment or other patient  management decisions, including infection  control decisions, particularly in the presence of clinical signs and  symptoms consistent with COVID-19, or in those who have been in contact with the virus.  Negative results must be combined with clinical observations, patient history, and epidemiological information. The expected result is Negative. Fact Sheet for Patients: PodPark.tn Fact Sheet for Healthcare Providers: GiftContent.is This test is not yet approved or cleared by the [place] FDA and  has been authorized for detection and/or diagnosis of SARS-CoV-2 by FDA under an Emergency Use Authorization (EUA).  This EUA will remain in effect (meaning this test can be used) for the duration of  the COVID-19 de claration under Section 564(b)(1) of the Act, 21 U.S.C. section 360bbb-3(b)(1), unless the authorization is terminated or revoked sooner.   SARS CORONAVIRUS 2 (TAT 6-24 HRS) Nasopharyngeal Nasopharyngeal Swab     Status: None   Collection Time: 12/28/18 10:30 PM   Specimen: Nasopharyngeal Swab  Result Value Ref Range   SARS Coronavirus 2 NEGATIVE NEGATIVE    Comment: (NOTE) SARS-CoV-2 target nucleic acids are NOT DETECTED. The SARS-CoV-2 RNA is generally detectable in upper and lower respiratory specimens during the acute phase of infection. Negative results do not preclude SARS-CoV-2 infection, do not rule out co-infections with other pathogens, and should not be used as the sole basis for treatment or other patient management decisions. Negative results must be combined with clinical observations, patient history, and epidemiological information. The expected result is Negative. Fact Sheet for Patients: SugarRoll.be Fact Sheet for Healthcare Providers: https://www.woods-mathews.com/ This test is not yet approved or cleared by the [place] FDA and  has been authorized  for detection and/or diagnosis of SARS-CoV-2 by FDA under an Emergency Use Authorization (EUA). This EUA will remain  in effect (meaning this test can be used) for the duration  of the COVID-19 declaration under Section 56 4(b)(1) of the Act, 21 U.S.C. section 360bbb-3(b)(1), unless the authorization is terminated or revoked sooner. Performed at [facility], [place] [address] [place], [place] [address]   Respiratory Panel by PCR     Status: None   Collection Time: 12/28/18 10:30 PM   Specimen: Nasopharyngeal Swab; Respiratory  Result Value Ref Range   Adenovirus NOT DETECTED NOT DETECTED   Coronavirus 229E NOT DETECTED NOT DETECTED    Comment: (NOTE) The Coronavirus on the Respiratory Panel, DOES NOT test for the novel  Coronavirus (2019 nCoV)    Coronavirus HKU1 NOT DETECTED NOT DETECTED   Coronavirus NL63 NOT DETECTED NOT DETECTED   Coronavirus OC43 NOT DETECTED NOT DETECTED   Metapneumovirus NOT DETECTED NOT DETECTED   Rhinovirus / Enterovirus NOT DETECTED NOT DETECTED   Influenza A NOT DETECTED NOT DETECTED   Influenza B NOT DETECTED NOT DETECTED   Parainfluenza Virus 1 NOT DETECTED NOT DETECTED   Parainfluenza Virus 2 NOT DETECTED NOT DETECTED   Parainfluenza Virus 3 NOT DETECTED NOT DETECTED   Parainfluenza Virus 4 NOT DETECTED NOT DETECTED   Respiratory Syncytial Virus NOT DETECTED NOT DETECTED   Bordetella pertussis NOT DETECTED NOT DETECTED   Chlamydophila pneumoniae NOT DETECTED NOT DETECTED   Mycoplasma pneumoniae NOT DETECTED NOT DETECTED    Comment: Performed at [facility], [place] [address] [place], [place] [address]  CBC with Differential/Platelet     Status: Abnormal   Collection Time: 02/18/19 11:15 AM  Result Value Ref Range   WBC 7.2 4.0 - 10.5 K/uL   RBC 4.05 3.87 - 5.11 Mil/uL   Hemoglobin 11.3 (L) 12.0 - 15.0 g/dL   HCT 35.8 (L) 36.0 - 46.0 %   MCV 88.4 78.0 - 100.0 fl   MCHC 31.5 30.0 - 36.0 g/dL   RDW 20.3 (H) 11.5 - 15.5 %   Platelets  193.0 150.0 - 400.0 K/uL   Neutrophils Relative % 51.9 43.0 - 77.0 %   Lymphocytes Relative 35.3 12.0 - 46.0 %   Monocytes Relative 6.9 3.0 - 12.0 %   Eosinophils Relative 5.2 (H) 0.0 - 5.0 %   Basophils Relative 0.7 0.0 - 3.0 %   Neutro Abs 3.7 1.4 - 7.7 K/uL   Lymphs Abs 2.5 0.7 - 4.0 K/uL   Monocytes Absolute 0.5 0.1 - 1.0 K/uL   Eosinophils Absolute 0.4 0.0 - 0.7 K/uL   Basophils Absolute 0.1 0.0 - 0.1 K/uL  TSH     Status: Abnormal   Collection Time: 02/18/19 11:15 AM  Result Value Ref Range   TSH 6.22 (H) 0.35 - 4.50 uIU/mL  Comprehensive metabolic panel     Status: Abnormal   Collection Time: 02/18/19 11:15 AM  Result Value Ref Range   Sodium 136 135 - 145 mEq/L   Potassium 3.8 3.5 - 5.1 mEq/L   Chloride 99 96 - 112 mEq/L   CO2 30 19 - 32 mEq/L   Glucose, Bld 222 (H) 70 - 99 mg/dL   BUN 10 6 - 23 mg/dL   Creatinine, Ser 0.91 0.40 - 1.20 mg/dL   Total Bilirubin 0.6 0.2 - 1.2 mg/dL   Alkaline Phosphatase 91 39 - 117 U/L   AST 41 (H) 0 - 37 U/L   ALT 23 0 - 35 U/L   Total Protein 6.4 6.0 - 8.3 g/dL   Albumin 3.3 (L) 3.5 - 5.2 g/dL   GFR 62.43 >60.00 mL/min   Calcium 8.1 (  L) 8.4 - 10.5 mg/dL  T3, free     Status: None   Collection Time: 02/19/19  3:51 PM  Result Value Ref Range   T3, Free 2.7 2.3 - 4.2 pg/mL  T4, free     Status: None   Collection Time: 02/19/19  3:51 PM  Result Value Ref Range   Free T4 0.81 0.60 - 1.60 ng/dL    Comment: Specimens from patients who are undergoing biotin therapy and /or ingesting biotin supplements may contain high levels of biotin.  The higher biotin concentration in these specimens interferes with this Free T4 assay.  Specimens that contain high levels  of biotin may cause false high results for this Free T4 assay.  Please interpret results in light of the total clinical presentation of the patient.    Comp Met (CMET)     Status: Abnormal   Collection Time: 03/05/19 11:43 AM  Result Value Ref Range   Sodium 128 (L) 135 - 145 mEq/L    Potassium 3.8 3.5 - 5.1 mEq/L   Chloride 89 (L) 96 - 112 mEq/L   CO2 29 19 - 32 mEq/L   Glucose, Bld 723 (HH) 70 - 99 mg/dL   BUN 15 6 - 23 mg/dL   Creatinine, Ser 1.20 0.40 - 1.20 mg/dL   Total Bilirubin 0.5 0.2 - 1.2 mg/dL   Alkaline Phosphatase 116 39 - 117 U/L   AST 53 (H) 0 - 37 U/L   ALT 35 0 - 35 U/L   Total Protein 6.5 6.0 - 8.3 g/dL   Albumin 3.3 (L) 3.5 - 5.2 g/dL   GFR 45.36 (L) >60.00 mL/min   Calcium 8.2 (L) 8.4 - 10.5 mg/dL  CBG monitoring, ED     Status: Abnormal   Collection Time: 03/05/19  6:43 PM  Result Value Ref Range   Glucose-Capillary 560 (HH) 70 - 99 mg/dL   Comment 1 Notify RN   CBC     Status: Abnormal   Collection Time: 03/05/19  7:21 PM  Result Value Ref Range   WBC 7.9 4.0 - 10.5 K/uL   RBC 3.99 3.87 - 5.11 MIL/uL   Hemoglobin 11.0 (L) 12.0 - 15.0 g/dL   HCT 36.9 36.0 - 46.0 %   MCV 92.5 80.0 - 100.0 fL   MCH 27.6 26.0 - 34.0 pg   MCHC 29.8 (L) 30.0 - 36.0 g/dL   RDW 17.5 (H) 11.5 - 15.5 %   Platelets 197 150 - 400 K/uL   nRBC 0.0 0.0 - 0.2 %    Comment: Performed at [facility], [address] [place], [place] [address]  Basic metabolic panel     Status: Abnormal   Collection Time: 03/05/19  7:21 PM  Result Value Ref Range   Sodium 130 (L) 135 - 145 mmol/L   Potassium 3.5 3.5 - 5.1 mmol/L   Chloride 90 (L) 98 - 111 mmol/L   CO2 30 22 - 32 mmol/L   Glucose, Bld 537 (HH) 70 - 99 mg/dL    Comment: CRITICAL RESULT CALLED TO, READ BACK BY AND VERIFIED WITH: TURNER,C ON 03/05/19 AT 2030 BY LOY,C    BUN 14 8 - 23 mg/dL   Creatinine, Ser 1.32 (H) 0.44 - 1.00 mg/dL   Calcium 8.5 (L) 8.9 - 10.3 mg/dL   GFR calc non Af Amer 43 (L) >60 mL/min   GFR calc Af Amer 50 (L) >60 mL/min   Anion gap 10 5 - 15    Comment: Performed  at [facility], [address], [place], [place] [address]  Urinalysis, Routine w reflex microscopic     Status: Abnormal   Collection Time: 03/05/19 10:35 PM  Result Value Ref Range   Color, Urine YELLOW YELLOW   APPearance  CLEAR CLEAR   Specific Gravity, Urine 1.029 1.005 - 1.030   pH 6.0 5.0 - 8.0   Glucose, UA >=500 (A) NEGATIVE mg/dL   Hgb urine dipstick NEGATIVE NEGATIVE   Bilirubin Urine NEGATIVE NEGATIVE   Ketones, ur NEGATIVE NEGATIVE mg/dL   Protein, ur NEGATIVE NEGATIVE mg/dL   Nitrite NEGATIVE NEGATIVE   Leukocytes,Ua NEGATIVE NEGATIVE   RBC / HPF 11-20 0 - 5 RBC/hpf   WBC, UA 6-10 0 - 5 WBC/hpf   Bacteria, UA RARE (A) NONE SEEN   Squamous Epithelial / LPF 6-10 0 - 5   Mucus PRESENT    Budding Yeast PRESENT     Comment: Performed at [facility], [address] [place], [place] [address]  CBG monitoring, ED     Status: Abnormal   Collection Time: 03/05/19 10:43 PM  Result Value Ref Range   Glucose-Capillary 445 (H) 70 - 99 mg/dL    Assessment/Plan: 1. Elevated liver enzymes Repeat liver enzymes today to further assess.  Continue avoidance of Tylenol-containing products. - Comp Met (CMET)  2. Peripheral edema Improved from last assessment.  Repeat metabolic panel today.  Continue leg elevation.  Again encourage patient to start compression stockings. - Comp Met (CMET)  3. Pruritus Has improved as swelling has improved.  Continue current regimen. - Comp Met (CMET)   [name], PA-C

## 2019-03-10 NOTE — Progress Notes (Signed)
Virtual Visit via Telephone Note  I connected with [name] on 03/10/19 at  2:00 PM EST by telephone and verified that I am speaking with the correct person using two identifiers.  Location: Patient: Home Provider: [place]   I discussed the limitations, risks, security and privacy concerns of performing an evaluation and management service by telephone and the availability of in person appointments. I also discussed with the patient that there may be a patient responsible charge related to this service. The patient expressed understanding and agreed to proceed.  History of Present Illness: Patient presents via telephone today for ER follow-up.  Patient unable to utilize video platform.  Patient was sent to the ER by this practice on 03/05/2019 after critical glucose level of 723 was reported by lab.  Patient without prior history of diabetes.  ER assessment included lab work (serum glucose 537, creatinine 1.32, calcium 8.5, sodium 130, urine glucose greater than 500, hemoglobin 11), EKG (NSR with occasional PVC) and chest x-ray (negative for acute cardiopulmonary process).  Patient was hydrated with IV fluids.  No concern at that time for metabolic acidosis. Repeat CBG in low 400s.  Patient was determined to be safe for discharge.  Was discharged home to take Metformin 500 mg twice daily and have close follow-up with primary care.  Since discharge, patient endorses taking the Metformin twice daily as directed.  Having significant issue with diarrhea on this medication regimen.  Notes some nausea.  Had one episode of vomiting but no ongoing emesis with medication.  Patient had called in earlier today regarding an episode of emesis and diarrhea.  Was instructed to hold Metformin until her appointment today.  Rx Zofran was sent home which she has not had to use.  She was also asked to check her glucose but was unable to as she was not given a glucometer on discharge from the ER.  Rx was sent in  earlier so she has been able to check her sugar.  Endorses glucose at 176 (non-fasting) at present.  Denies fever, chills.  Notes her chronic peripheral edema is very well controlled at present.  Denies any polyuria, polydipsia or polyphagia currently.   Observations/Objective: No labored breathing.  Speech is clear and coherent with logical content.  Patient is alert and oriented at baseline.   Assessment and Plan: 1. Diabetes mellitus, new onset ([place]) Unable to tolerate regular release generic Metformin.  This is been stopped.  Will attempt extended release branded Metformin to see if she tolerates this better.  If not covered will have to switch to extended release generic Metformin.  Will start 500 mg dose.  Glucose today is actually at a good non-fasting level and I am quite taken aback by this.  We will have her check fasting glucose once daily and record.  Check nonfasting glucose a few times a week and record.  Check glucose anytime noting new onset fatigue, shaking, etcetera.  On further discussion of diet it seems she and her husband basically eat 2 meals per day which are typically fast food.  Drinks sweet tea only throughout the day.  Insert ER assessment she has stopped all sweet tea consumption and is only drinking water.  Still eating out for most meals.  Recommend cooking at home since she does know how to cook and has the appropriate tools.  Discussed need for lean protein plenty of fresh vegetables.  We will plan on in office follow-up in 1 week to make further adjustments in  for more in-depth diabetic education.  2. Hyponatremia Patient given IV fluid resuscitation.  We will have her come in for labs to reassess this.  3. Elevated serum creatinine Patient received IV fluids in emergency room.  We will have her come in for labs tomorrow to recheck.   Follow Up Instructions:    I discussed the assessment and treatment plan with the patient. The patient was provided an  opportunity to ask questions and all were answered. The patient agreed with the plan and demonstrated an understanding of the instructions.   The patient was advised to call back or seek an in-person evaluation if the symptoms worsen or if the condition fails to improve as anticipated.  I provided 30 minutes of non-face-to-face time during this encounter.   [name], PA-C

## 2019-03-11 ENCOUNTER — Telehealth: Payer: Self-pay | Admitting: Adult Health

## 2019-03-11 NOTE — Telephone Encounter (Signed)
[facility] is currently closed. WCB on 2.12.21 to verify fax number.

## 2019-03-12 ENCOUNTER — Ambulatory Visit (INDEPENDENT_AMBULATORY_CARE_PROVIDER_SITE_OTHER): Payer: Medicare Other | Admitting: *Deleted

## 2019-03-12 ENCOUNTER — Other Ambulatory Visit: Payer: Self-pay | Admitting: Physician Assistant

## 2019-03-12 ENCOUNTER — Other Ambulatory Visit: Payer: Self-pay

## 2019-03-12 DIAGNOSIS — R748 Abnormal levels of other serum enzymes: Secondary | ICD-10-CM

## 2019-03-12 DIAGNOSIS — E871 Hypo-osmolality and hyponatremia: Secondary | ICD-10-CM

## 2019-03-12 DIAGNOSIS — E119 Type 2 diabetes mellitus without complications: Secondary | ICD-10-CM

## 2019-03-12 DIAGNOSIS — R7989 Other specified abnormal findings of blood chemistry: Secondary | ICD-10-CM

## 2019-03-12 DIAGNOSIS — E039 Hypothyroidism, unspecified: Secondary | ICD-10-CM

## 2019-03-12 NOTE — Telephone Encounter (Signed)
LMOM TCB x1 for Raquel Sarna w/ [facility] >> would like to verify fax number.  Advised she may leave this verified fax number when she calls back if triage is busy and we can fax the office note.

## 2019-03-12 NOTE — Addendum Note (Signed)
Addended by: Betti Cruz on: 03/12/2019 01:52 PM   Modules accepted: Orders

## 2019-03-13 LAB — COMPREHENSIVE METABOLIC PANEL
AG Ratio: 1.2 (calc) (ref 1.0–2.5)
ALT: 26 U/L (ref 6–29)
AST: 52 U/L — ABNORMAL HIGH (ref 10–35)
Albumin: 3.7 g/dL (ref 3.6–5.1)
Alkaline phosphatase (APISO): 99 U/L (ref 37–153)
BUN/Creatinine Ratio: 10 (calc) (ref 6–22)
BUN: 12 mg/dL (ref 7–25)
CO2: 27 mmol/L (ref 20–32)
Calcium: 8.8 mg/dL (ref 8.6–10.4)
Chloride: 96 mmol/L — ABNORMAL LOW (ref 98–110)
Creat: 1.19 mg/dL — ABNORMAL HIGH (ref 0.50–0.99)
Globulin: 3.1 g/dL (calc) (ref 1.9–3.7)
Glucose, Bld: 107 mg/dL — ABNORMAL HIGH (ref 65–99)
Potassium: 3.8 mmol/L (ref 3.5–5.3)
Sodium: 136 mmol/L (ref 135–146)
Total Bilirubin: 0.5 mg/dL (ref 0.2–1.2)
Total Protein: 6.8 g/dL (ref 6.1–8.1)

## 2019-03-13 LAB — CBC WITH DIFFERENTIAL/PLATELET
Absolute Monocytes: 672 cells/uL (ref 200–950)
Basophils Absolute: 28 cells/uL (ref 0–200)
Basophils Relative: 0.3 %
Eosinophils Absolute: 285 cells/uL (ref 15–500)
Eosinophils Relative: 3.1 %
HCT: 36.3 % (ref 35.0–45.0)
Hemoglobin: 11.7 g/dL (ref 11.7–15.5)
Lymphs Abs: 3257 cells/uL (ref 850–3900)
MCH: 27.9 pg (ref 27.0–33.0)
MCHC: 32.2 g/dL (ref 32.0–36.0)
MCV: 86.4 fL (ref 80.0–100.0)
MPV: 11 fL (ref 7.5–12.5)
Monocytes Relative: 7.3 %
Neutro Abs: 4959 cells/uL (ref 1500–7800)
Neutrophils Relative %: 53.9 %
Platelets: 237 10*3/uL (ref 140–400)
RBC: 4.2 10*6/uL (ref 3.80–5.10)
RDW: 16.5 % — ABNORMAL HIGH (ref 11.0–15.0)
Total Lymphocyte: 35.4 %
WBC: 9.2 10*3/uL (ref 3.8–10.8)

## 2019-03-13 LAB — MICROALBUMIN / CREATININE URINE RATIO
Creatinine, Urine: 91 mg/dL (ref 20–275)
Microalb Creat Ratio: 4 mcg/mg creat (ref <30)
Microalb, Ur: 0.4 mg/dL

## 2019-03-13 LAB — TSH: TSH: 4.89 mIU/L — ABNORMAL HIGH (ref 0.40–4.50)

## 2019-03-15 NOTE — Telephone Encounter (Signed)
LMTCB x3 for Raquel Sarna with [facility].

## 2019-03-16 NOTE — Telephone Encounter (Signed)
We have attempted to contact Raquel Sarna with [facility] several times with no success or call back from her. Per triage protocol, message will be closed.

## 2019-03-17 ENCOUNTER — Ambulatory Visit: Payer: Medicare Other | Admitting: Neurology

## 2019-03-18 ENCOUNTER — Other Ambulatory Visit: Payer: Self-pay | Admitting: Gastroenterology

## 2019-03-19 ENCOUNTER — Other Ambulatory Visit: Payer: Self-pay | Admitting: Physician Assistant

## 2019-03-19 DIAGNOSIS — G894 Chronic pain syndrome: Secondary | ICD-10-CM

## 2019-03-19 MED ORDER — OXYCODONE-ACETAMINOPHEN 10-325 MG PO TABS: 1.0000 | ORAL_TABLET | Freq: Four times a day (QID) | ORAL | 0 refills | Status: DC | PRN

## 2019-03-19 MED ORDER — ALPRAZOLAM 1 MG PO TABS: 1.0000 mg | ORAL_TABLET | Freq: Four times a day (QID) | ORAL | 0 refills | Status: DC | PRN

## 2019-03-19 NOTE — Telephone Encounter (Signed)
Pt called in asking for a refill on the alprazolam and the oxycodone, pt uses [facility]. Please advise

## 2019-03-19 NOTE — Telephone Encounter (Signed)
Last rx for Xanax was 02/18/19 #120  Indication for chronic opioid: Chronic pain syndrome Medication and dose: Percocet 10/325 mg # pills per month: 120 on 02/18/19 Last UDS date: 08/31/18 Opioid Treatment Agreement signed (Y/N): Yes on 08/18/17 Opioid Treatment Agreement last reviewed with patient:   [facility] reviewed this encounter (include red flags):     LOV: 03/10/19 DM

## 2019-03-22 ENCOUNTER — Encounter: Payer: Self-pay | Admitting: Physician Assistant

## 2019-03-22 ENCOUNTER — Ambulatory Visit (INDEPENDENT_AMBULATORY_CARE_PROVIDER_SITE_OTHER): Payer: Medicare Other | Admitting: Physician Assistant

## 2019-03-22 VITALS — BP 110/80 | HR 100 | Temp 98.2°F | Resp 16 | Ht 62.0 in | Wt 254.0 lb

## 2019-03-22 DIAGNOSIS — E119 Type 2 diabetes mellitus without complications: Secondary | ICD-10-CM

## 2019-03-22 LAB — POCT GLYCOSYLATED HEMOGLOBIN (HGB A1C): Hemoglobin A1C: 9.3 % — AB (ref 4.0–5.6)

## 2019-03-22 LAB — POCT CBG (FASTING - GLUCOSE)-MANUAL ENTRY: Glucose Fasting, POC: 119 mg/dL — AB (ref 70–99)

## 2019-03-22 NOTE — Progress Notes (Signed)
Patient presents to clinic today for close follow-up with new onset diabetes.  At last visit patient had been having issue with the Metformin given to her by ER physician.  Was causing significant GI symptoms.  As such we will switch patient to branded and extended release Metformin.  Notes she has been taking daily as directed and tolerating very well without noted side effects.  Is checking her glucose daily.  Averages 150s up to 250s.  Denies polyuria, polydipsia or polyphagia.  Notes no recurrence of leg swelling.  Is keeping hydrated and keeping a very low-carb/low sugar diet.  Is refraining from nighttime snacking.  At last visit renal function was noted to be improving from ER values.  Urine microalbumin was updated at that time within normal limits.  She continues to take her statin as directed.  Past Medical History:  Diagnosis Date  . Allergic rhinitis   . Anemia   . Anxiety   . Chicken pox   . Chronic back pain   . COPD (chronic obstructive pulmonary disease) ([place])   . Depression   . Essential hypertension   . GERD (gastroesophageal reflux disease)   . Headache    migraines  . History of gastritis    EGD 2015  . History of home oxygen therapy    2 liters at hs last 6 months  . Hyperlipidemia   . Hypothyroidism   . Migraines   . Osteoarthritis    oa  . Scoliosis     Current Outpatient Medications on File Prior to Visit  Medication Sig Dispense Refill  . albuterol (PROAIR HFA) 108 (90 Base) MCG/ACT inhaler 2 puffs every 4 hours as needed only  if your can't catch your breath 6.7 g 0  . ALPRAZolam (XANAX) 1 MG tablet Take 1 tablet (1 mg total) by mouth 4 (four) times daily as needed. for anxiety 120 tablet 0  . atorvastatin (LIPITOR) 20 MG tablet TAKE 1 TABLET BY MOUTH AT BEDTIME 90 tablet 1  . blood glucose meter kit and supplies KIT Dispense based on patient and insurance preference. Use up to four times daily as directed. (FOR ICD-9 250.00, 250.01). 1 each 0  .  DULoxetine (CYMBALTA) 30 MG capsule Take 1 capsule (30 mg total) by mouth daily. 14 capsule 1  . fluticasone (FLONASE) 50 MCG/ACT nasal spray Place 2 sprays into both nostrils daily.    . furosemide (LASIX) 20 MG tablet Take 2 tablets (40 mg total) by mouth daily. 30 tablet 3  . gabapentin (NEURONTIN) 300 MG capsule Take 600 mg by mouth 2 (two) times daily.     . hydrOXYzine (ATARAX/VISTARIL) 25 MG tablet Take 1 tablet (25 mg total) by mouth 3 (three) times daily as needed. 30 tablet 0  . levothyroxine (SYNTHROID) 75 MCG tablet TAKE 1 TABLET BY MOUTH DAILY BEFORE BREAKFAST (Patient taking differently: Take 75 mcg by mouth daily before breakfast. ) 90 tablet 1  . loratadine (CLARITIN) 10 MG tablet Take 10 mg by mouth daily.    . meloxicam (MOBIC) 15 MG tablet Take 15 mg by mouth daily.    . metFORMIN (GLUCOPHAGE XR) 500 MG 24 hr tablet Take 1 tablet (500 mg total) by mouth daily with breakfast. 30 tablet 1  . omeprazole (PRILOSEC) 40 MG capsule TAKE ONE CAPSULE BY MOUTH DAILY BEFORE BREAKFAST and TAKE ONE CAPSULE DAILY BEFORE DINNER 60 capsule 0  . ondansetron (ZOFRAN) 4 MG tablet Take 1 tablet (4 mg total) by mouth every 8 (eight)  hours as needed for nausea or vomiting. 10 tablet 0  . oxyCODONE-acetaminophen (PERCOCET) 10-325 MG tablet Take 1 tablet by mouth every 6 (six) hours as needed for pain. 120 tablet 0  . OXYGEN Inhale 2 L into the lungs at bedtime.     . potassium chloride (KLOR-CON) 10 MEQ tablet TAKE 1 TABLET BY MOUTH AT BEDTIME 90 tablet 1  . SYMBICORT 160-4.5 MCG/ACT inhaler INHALE TWO PUFFS BY MOUTH TWICE DAILY (Patient taking differently: Inhale 2 puffs into the lungs 2 (two) times daily. ) 10.2 g 5  . Tiotropium Bromide Monohydrate (SPIRIVA RESPIMAT) 2.5 MCG/ACT AERS Inhale 2 puffs into the lungs daily. 4 g 5  . triamterene-hydrochlorothiazide (MAXZIDE-25) 37.5-25 MG tablet TAKE 1 TABLET BY MOUTH EVERY DAY (Patient taking differently: Take 1 tablet by mouth daily. ) 90 tablet 1   No  current facility-administered medications on file prior to visit.    Allergies  Allergen Reactions  . Nsaids Diarrhea  . Aleve [Naproxen Sodium] Other (See Comments)    Headache   . Codeine Nausea Only and Other (See Comments)    GI upset  . Penicillins Nausea Only and Other (See Comments)    GI upset Has patient had a PCN reaction causing immediate rash, facial/tongue/throat swelling, SOB or lightheadedness with hypotension: No Has patient had a PCN reaction causing severe rash involving mucus membranes or skin necrosis: No Has patient had a PCN reaction that required hospitalization No Has patient had a PCN reaction occurring within the last 10 years: No If all of the above answers are "NO", then may proceed with Cephalosporin use.   . Sulfonamide Derivatives Hives    Family History  Problem Relation Age of Onset  . COPD Mother   . Heart disease Mother   . Lung disease Father        Asbestosis  . Heart attack Father   . Heart disease Father   . Cerebral aneurysm Brother   . Aneurysm Brother        Brain  . Epilepsy Son   . Arthritis Maternal Grandmother   . Heart disease Maternal Grandmother   . Asthma Maternal Grandfather   . Cancer Maternal Grandfather   . Arthritis Paternal Grandmother   . Heart disease Paternal Grandmother   . Stroke Paternal Grandmother   . Early death Paternal Grandfather   . Heart disease Paternal Grandfather     Social History   Socioeconomic History  . Marital status: Married    Spouse name: Not on file  . Number of children: Not on file  . Years of education: Not on file  . Highest education level: Not on file  Occupational History  . Not on file  Tobacco Use  . Smoking status: Current Every Day Smoker    Packs/day: 1.50    Years: 46.00    Pack years: 69.00    Types: Cigarettes  . Smokeless tobacco: Never Used  Substance and Sexual Activity  . Alcohol use: No  . Drug use: No  . Sexual activity: Yes  Other Topics Concern   . Not on file  Social History Narrative   Right handed    Caffeine~ 2 cups per day    Lives at home with husband (strained relationship)   Primary caretaker for disabled brother who had aneurism   Daughter died 03-Jul-2018    Social Determinants of Health   Financial Resource Strain:   . Difficulty of Paying Living Expenses: Not on file  Food Insecurity:   .  Worried About Charity fundraiser in the Last Year: Not on file  . Ran Out of Food in the Last Year: Not on file  Transportation Needs:   . Lack of Transportation (Medical): Not on file  . Lack of Transportation (Non-Medical): Not on file  Physical Activity:   . Days of Exercise per Week: Not on file  . Minutes of Exercise per Session: Not on file  Stress:   . Feeling of Stress : Not on file  Social Connections:   . Frequency of Communication with Friends and Family: Not on file  . Frequency of Social Gatherings with Friends and Family: Not on file  . Attends Religious Services: Not on file  . Active Member of Clubs or Organizations: Not on file  . Attends Archivist Meetings: Not on file  . Marital Status: Not on file   Review of Systems - See HPI.  All other ROS are negative.  BP 110/80   Pulse 100   Temp 98.2 F (36.8 C) (Temporal)   Resp 16   Ht 5' 2"  (1.575 m)   Wt 254 lb (115.2 kg)   SpO2 96%   BMI 46.46 kg/m   Physical Exam Vitals reviewed.  Constitutional:      Appearance: Normal appearance.  HENT:     Head: Normocephalic and atraumatic.  Eyes:     Conjunctiva/sclera: Conjunctivae normal.  Cardiovascular:     Rate and Rhythm: Normal rate and regular rhythm.     Pulses: Normal pulses.          Dorsalis pedis pulses are 2+ on the right side and 2+ on the left side.       Posterior tibial pulses are 2+ on the right side and 2+ on the left side.     Heart sounds: Normal heart sounds.     Comments: Trace nonpitting edema noted bilaterally.  Significantly improved from previous  exam. Pulmonary:     Effort: Pulmonary effort is normal.     Breath sounds: Normal breath sounds.  Musculoskeletal:     Cervical back: Neck supple.  Neurological:     Mental Status: She is alert.  Psychiatric:        Mood and Affect: Mood normal.     Recent Results (from the past 2160 hour(s))  Basic metabolic panel     Status: Abnormal   Collection Time: 12/28/18  2:48 PM  Result Value Ref Range   Sodium 133 (L) 135 - 145 mmol/L   Potassium 3.8 3.5 - 5.1 mmol/L   Chloride 96 (L) 98 - 111 mmol/L   CO2 26 22 - 32 mmol/L   Glucose, Bld 173 (H) 70 - 99 mg/dL   BUN 15 8 - 23 mg/dL   Creatinine, Ser 1.23 (H) 0.44 - 1.00 mg/dL   Calcium 8.0 (L) 8.9 - 10.3 mg/dL   GFR calc non Af Amer 47 (L) >60 mL/min   GFR calc Af Amer 54 (L) >60 mL/min   Anion gap 11 5 - 15    Comment: Performed at [facility], [place] [address] [place], [place] [address]  CBC     Status: Abnormal   Collection Time: 12/28/18  2:48 PM  Result Value Ref Range   WBC 13.8 (H) 4.0 - 10.5 K/uL   RBC 4.12 3.87 - 5.11 MIL/uL   Hemoglobin 11.0 (L) 12.0 - 15.0 g/dL   HCT 37.5 36.0 - 46.0 %   MCV 91.0 80.0 - 100.0 fL  MCH 26.7 26.0 - 34.0 pg   MCHC 29.3 (L) 30.0 - 36.0 g/dL   RDW 20.0 (H) 11.5 - 15.5 %   Platelets 206 150 - 400 K/uL   nRBC 0.0 0.0 - 0.2 %    Comment: Performed at [facility], [place] [address], [place], [place] [address]  POC SARS Coronavirus 2 Ag-ED - Nasal Swab (BD Veritor Kit)     Status: None   Collection Time: 12/28/18  4:58 PM  Result Value Ref Range   SARS Coronavirus 2 Ag NEGATIVE NEGATIVE    Comment: (NOTE) SARS-CoV-2 antigen NOT DETECTED.  Negative results are presumptive.  Negative results do not preclude SARS-CoV-2 infection and should not be used as the sole basis for treatment or other patient management decisions, including infection  control decisions, particularly in the presence of clinical signs and  symptoms consistent with COVID-19, or in  those who have been in contact with the virus.  Negative results must be combined with clinical observations, patient history, and epidemiological information. The expected result is Negative. Fact Sheet for Patients: PodPark.tn Fact Sheet for Healthcare Providers: GiftContent.is This test is not yet approved or cleared by the [place] FDA and  has been authorized for detection and/or diagnosis of SARS-CoV-2 by FDA under an Emergency Use Authorization (EUA).  This EUA will remain in effect (meaning this test can be used) for the duration of  the COVID-19 de claration under Section 564(b)(1) of the Act, 21 U.S.C. section 360bbb-3(b)(1), unless the authorization is terminated or revoked sooner.   SARS CORONAVIRUS 2 (TAT 6-24 HRS) Nasopharyngeal Nasopharyngeal Swab     Status: None   Collection Time: 12/28/18 10:30 PM   Specimen: Nasopharyngeal Swab  Result Value Ref Range   SARS Coronavirus 2 NEGATIVE NEGATIVE    Comment: (NOTE) SARS-CoV-2 target nucleic acids are NOT DETECTED. The SARS-CoV-2 RNA is generally detectable in upper and lower respiratory specimens during the acute phase of infection. Negative results do not preclude SARS-CoV-2 infection, do not rule out co-infections with other pathogens, and should not be used as the sole basis for treatment or other patient management decisions. Negative results must be combined with clinical observations, patient history, and epidemiological information. The expected result is Negative. Fact Sheet for Patients: SugarRoll.be Fact Sheet for Healthcare Providers: https://www.woods-mathews.com/ This test is not yet approved or cleared by the [place] FDA and  has been authorized for detection and/or diagnosis of SARS-CoV-2 by FDA under an Emergency Use Authorization (EUA). This EUA will remain  in effect (meaning this test can be  used) for the duration of the COVID-19 declaration under Section 56 4(b)(1) of the Act, 21 U.S.C. section 360bbb-3(b)(1), unless the authorization is terminated or revoked sooner. Performed at [facility], [place] [address], [place], [place] [address]   Respiratory Panel by PCR     Status: None   Collection Time: 12/28/18 10:30 PM   Specimen: Nasopharyngeal Swab; Respiratory  Result Value Ref Range   Adenovirus NOT DETECTED NOT DETECTED   Coronavirus 229E NOT DETECTED NOT DETECTED    Comment: (NOTE) The Coronavirus on the Respiratory Panel, DOES NOT test for the novel  Coronavirus (2019 nCoV)    Coronavirus HKU1 NOT DETECTED NOT DETECTED   Coronavirus NL63 NOT DETECTED NOT DETECTED   Coronavirus OC43 NOT DETECTED NOT DETECTED   Metapneumovirus NOT DETECTED NOT DETECTED   Rhinovirus / Enterovirus NOT DETECTED NOT DETECTED   Influenza A NOT DETECTED NOT DETECTED   Influenza  B NOT DETECTED NOT DETECTED   Parainfluenza Virus 1 NOT DETECTED NOT DETECTED   Parainfluenza Virus 2 NOT DETECTED NOT DETECTED   Parainfluenza Virus 3 NOT DETECTED NOT DETECTED   Parainfluenza Virus 4 NOT DETECTED NOT DETECTED   Respiratory Syncytial Virus NOT DETECTED NOT DETECTED   Bordetella pertussis NOT DETECTED NOT DETECTED   Chlamydophila pneumoniae NOT DETECTED NOT DETECTED   Mycoplasma pneumoniae NOT DETECTED NOT DETECTED    Comment: Performed at [facility] [address], [place], [place] [address]  CBC with Differential/Platelet     Status: Abnormal   Collection Time: 02/18/19 11:15 AM  Result Value Ref Range   WBC 7.2 4.0 - 10.5 K/uL   RBC 4.05 3.87 - 5.11 Mil/uL   Hemoglobin 11.3 (L) 12.0 - 15.0 g/dL   HCT 35.8 (L) 36.0 - 46.0 %   MCV 88.4 78.0 - 100.0 fl   MCHC 31.5 30.0 - 36.0 g/dL   RDW 20.3 (H) 11.5 - 15.5 %   Platelets 193.0 150.0 - 400.0 K/uL   Neutrophils Relative % 51.9 43.0 - 77.0 %   Lymphocytes Relative 35.3 12.0 - 46.0 %   Monocytes Relative 6.9 3.0 - 12.0 %    Eosinophils Relative 5.2 (H) 0.0 - 5.0 %   Basophils Relative 0.7 0.0 - 3.0 %   Neutro Abs 3.7 1.4 - 7.7 K/uL   Lymphs Abs 2.5 0.7 - 4.0 K/uL   Monocytes Absolute 0.5 0.1 - 1.0 K/uL   Eosinophils Absolute 0.4 0.0 - 0.7 K/uL   Basophils Absolute 0.1 0.0 - 0.1 K/uL  TSH     Status: Abnormal   Collection Time: 02/18/19 11:15 AM  Result Value Ref Range   TSH 6.22 (H) 0.35 - 4.50 uIU/mL  Comprehensive metabolic panel     Status: Abnormal   Collection Time: 02/18/19 11:15 AM  Result Value Ref Range   Sodium 136 135 - 145 mEq/L   Potassium 3.8 3.5 - 5.1 mEq/L   Chloride 99 96 - 112 mEq/L   CO2 30 19 - 32 mEq/L   Glucose, Bld 222 (H) 70 - 99 mg/dL   BUN 10 6 - 23 mg/dL   Creatinine, Ser 0.91 0.40 - 1.20 mg/dL   Total Bilirubin 0.6 0.2 - 1.2 mg/dL   Alkaline Phosphatase 91 39 - 117 U/L   AST 41 (H) 0 - 37 U/L   ALT 23 0 - 35 U/L   Total Protein 6.4 6.0 - 8.3 g/dL   Albumin 3.3 (L) 3.5 - 5.2 g/dL   GFR 62.43 >60.00 mL/min   Calcium 8.1 (L) 8.4 - 10.5 mg/dL  T3, free     Status: None   Collection Time: 02/19/19  3:51 PM  Result Value Ref Range   T3, Free 2.7 2.3 - 4.2 pg/mL  T4, free     Status: None   Collection Time: 02/19/19  3:51 PM  Result Value Ref Range   Free T4 0.81 0.60 - 1.60 ng/dL    Comment: Specimens from patients who are undergoing biotin therapy and /or ingesting biotin supplements may contain high levels of biotin.  The higher biotin concentration in these specimens interferes with this Free T4 assay.  Specimens that contain high levels  of biotin may cause false high results for this Free T4 assay.  Please interpret results in light of the total clinical presentation of the patient.    Comp Met (CMET)     Status: Abnormal   Collection Time: 03/05/19  11:43 AM  Result Value Ref Range   Sodium 128 (L) 135 - 145 mEq/L   Potassium 3.8 3.5 - 5.1 mEq/L   Chloride 89 (L) 96 - 112 mEq/L   CO2 29 19 - 32 mEq/L   Glucose, Bld 723 (HH) 70 - 99 mg/dL   BUN 15 6 - 23 mg/dL    Creatinine, Ser 1.20 0.40 - 1.20 mg/dL   Total Bilirubin 0.5 0.2 - 1.2 mg/dL   Alkaline Phosphatase 116 39 - 117 U/L   AST 53 (H) 0 - 37 U/L   ALT 35 0 - 35 U/L   Total Protein 6.5 6.0 - 8.3 g/dL   Albumin 3.3 (L) 3.5 - 5.2 g/dL   GFR 45.36 (L) >60.00 mL/min   Calcium 8.2 (L) 8.4 - 10.5 mg/dL  CBG monitoring, ED     Status: Abnormal   Collection Time: 03/05/19  6:43 PM  Result Value Ref Range   Glucose-Capillary 560 (HH) 70 - 99 mg/dL   Comment 1 Notify RN   CBC     Status: Abnormal   Collection Time: 03/05/19  7:21 PM  Result Value Ref Range   WBC 7.9 4.0 - 10.5 K/uL   RBC 3.99 3.87 - 5.11 MIL/uL   Hemoglobin 11.0 (L) 12.0 - 15.0 g/dL   HCT 36.9 36.0 - 46.0 %   MCV 92.5 80.0 - 100.0 fL   MCH 27.6 26.0 - 34.0 pg   MCHC 29.8 (L) 30.0 - 36.0 g/dL   RDW 17.5 (H) 11.5 - 15.5 %   Platelets 197 150 - 400 K/uL   nRBC 0.0 0.0 - 0.2 %    Comment: Performed at [facility], [address] [place], [place] [address]  Basic metabolic panel     Status: Abnormal   Collection Time: 03/05/19  7:21 PM  Result Value Ref Range   Sodium 130 (L) 135 - 145 mmol/L   Potassium 3.5 3.5 - 5.1 mmol/L   Chloride 90 (L) 98 - 111 mmol/L   CO2 30 22 - 32 mmol/L   Glucose, Bld 537 (HH) 70 - 99 mg/dL    Comment: CRITICAL RESULT CALLED TO, READ BACK BY AND VERIFIED WITH: TURNER,C ON 03/05/19 AT 2030 BY LOY,C    BUN 14 8 - 23 mg/dL   Creatinine, Ser 1.32 (H) 0.44 - 1.00 mg/dL   Calcium 8.5 (L) 8.9 - 10.3 mg/dL   GFR calc non Af Amer 43 (L) >60 mL/min   GFR calc Af Amer 50 (L) >60 mL/min   Anion gap 10 5 - 15    Comment: Performed at [facility], [address], [place], [place] [address]  Urinalysis, Routine w reflex microscopic     Status: Abnormal   Collection Time: 03/05/19 10:35 PM  Result Value Ref Range   Color, Urine YELLOW YELLOW   APPearance CLEAR CLEAR   Specific Gravity, Urine 1.029 1.005 - 1.030   pH 6.0 5.0 - 8.0   Glucose, UA >=500 (A) NEGATIVE mg/dL   Hgb urine dipstick NEGATIVE  NEGATIVE   Bilirubin Urine NEGATIVE NEGATIVE   Ketones, ur NEGATIVE NEGATIVE mg/dL   Protein, ur NEGATIVE NEGATIVE mg/dL   Nitrite NEGATIVE NEGATIVE   Leukocytes,Ua NEGATIVE NEGATIVE   RBC / HPF 11-20 0 - 5 RBC/hpf   WBC, UA 6-10 0 - 5 WBC/hpf   Bacteria, UA RARE (A) NONE SEEN   Squamous Epithelial / LPF 6-10 0 - 5   Mucus PRESENT    Budding Yeast PRESENT  Comment: Performed at [facility], [address], [place], [place] [address]  CBG monitoring, ED     Status: Abnormal   Collection Time: 03/05/19 10:43 PM  Result Value Ref Range   Glucose-Capillary 445 (H) 70 - 99 mg/dL  Urine Microalbumin w/creat. ratio     Status: None   Collection Time: 03/12/19  1:54 PM  Result Value Ref Range   Creatinine, Urine 91 20 - 275 mg/dL   Microalb, Ur 0.4 mg/dL    Comment: Reference Range Not established    Microalb Creat Ratio 4 <30 mcg/mg creat    Comment: . The ADA defines abnormalities in albumin excretion as follows: Marland Kitchen Category         Result (mcg/mg creatinine) . Normal                    <30 Microalbuminuria         30-299  Clinical albuminuria   > OR = 300 . The ADA recommends that at least two of three specimens collected within a 3-6 month period be abnormal before considering a patient to be within a diagnostic category.   CBC w/Diff     Status: Abnormal   Collection Time: 03/12/19  1:54 PM  Result Value Ref Range   WBC 9.2 3.8 - 10.8 Thousand/uL   RBC 4.20 3.80 - 5.10 Million/uL   Hemoglobin 11.7 11.7 - 15.5 g/dL   HCT 36.3 35.0 - 45.0 %   MCV 86.4 80.0 - 100.0 fL   MCH 27.9 27.0 - 33.0 pg   MCHC 32.2 32.0 - 36.0 g/dL   RDW 16.5 (H) 11.0 - 15.0 %   Platelets 237 140 - 400 Thousand/uL   MPV 11.0 7.5 - 12.5 fL   Neutro Abs 4,959 1,500 - 7,800 cells/uL   Lymphs Abs 3,257 850 - 3,900 cells/uL   Absolute Monocytes 672 200 - 950 cells/uL   Eosinophils Absolute 285 15 - 500 cells/uL   Basophils Absolute 28 0 - 200 cells/uL   Neutrophils Relative % 53.9 %   Total  Lymphocyte 35.4 %   Monocytes Relative 7.3 %   Eosinophils Relative 3.1 %   Basophils Relative 0.3 %  Comp Met (CMET)     Status: Abnormal   Collection Time: 03/12/19  1:54 PM  Result Value Ref Range   Glucose, Bld 107 (H) 65 - 99 mg/dL    Comment: .            Fasting reference interval . For someone without known diabetes, a glucose value between 100 and 125 mg/dL is consistent with prediabetes and should be confirmed with a follow-up test. .    BUN 12 7 - 25 mg/dL   Creat 1.19 (H) 0.50 - 0.99 mg/dL    Comment: For patients >92 years of age, the reference limit for Creatinine is approximately 13% higher for people identified as African-American. .    BUN/Creatinine Ratio 10 6 - 22 (calc)   Sodium 136 135 - 146 mmol/L   Potassium 3.8 3.5 - 5.3 mmol/L   Chloride 96 (L) 98 - 110 mmol/L   CO2 27 20 - 32 mmol/L   Calcium 8.8 8.6 - 10.4 mg/dL   Total Protein 6.8 6.1 - 8.1 g/dL   Albumin 3.7 3.6 - 5.1 g/dL   Globulin 3.1 1.9 - 3.7 g/dL (calc)   AG Ratio 1.2 1.0 - 2.5 (calc)   Total Bilirubin 0.5 0.2 - 1.2 mg/dL   Alkaline phosphatase (APISO)  99 37 - 153 U/L   AST 52 (H) 10 - 35 U/L   ALT 26 6 - 29 U/L  TSH     Status: Abnormal   Collection Time: 03/12/19  1:54 PM  Result Value Ref Range   TSH 4.89 (H) 0.40 - 4.50 mIU/L    Assessment/Plan: 1. Diabetes mellitus, new onset ([place]) Tolerating extended release Metformin well.  Point-of-care glucose today at 119 which is a good value overall.  We will recheck renal function and A1c today.  If renal function stable increase Metformin XR to 1000 mg daily.  Close follow-up scheduled. - POCT HgB A1C - POCT CBG (Fasting - Glucose) - Comp Met (CMET)  This visit occurred during the SARS-CoV-2 public health emergency.  Safety protocols were in place, including screening questions prior to the visit, additional usage of staff PPE, and extensive cleaning of exam room while observing appropriate contact time as indicated for disinfecting  solutions.     [name], PA-C

## 2019-03-22 NOTE — Patient Instructions (Signed)
Please go to the lab today for blood work.  I will call you with your results. We will alter treatment regimen(s) if indicated by your results.   If renal function stable I will be increasing your Metformin dose to 1 tablet each morning and 1 in the evening. Continue other medications as directed.  Keep a check on glucose daily. Follow-up with me via phone in 2-3 weeks.  Sooner if needed!

## 2019-03-23 ENCOUNTER — Other Ambulatory Visit: Payer: Self-pay | Admitting: Emergency Medicine

## 2019-03-23 ENCOUNTER — Other Ambulatory Visit: Payer: Self-pay | Admitting: *Deleted

## 2019-03-23 DIAGNOSIS — L299 Pruritus, unspecified: Secondary | ICD-10-CM

## 2019-03-23 LAB — COMPREHENSIVE METABOLIC PANEL
ALT: 26 U/L (ref 0–35)
AST: 39 U/L — ABNORMAL HIGH (ref 0–37)
Albumin: 3.8 g/dL (ref 3.5–5.2)
Alkaline Phosphatase: 97 U/L (ref 39–117)
BUN: 13 mg/dL (ref 6–23)
CO2: 29 mEq/L (ref 19–32)
Calcium: 9.8 mg/dL (ref 8.4–10.5)
Chloride: 95 mEq/L — ABNORMAL LOW (ref 96–112)
Creatinine, Ser: 1.01 mg/dL (ref 0.40–1.20)
GFR: 55.33 mL/min — ABNORMAL LOW (ref >60.00)
Glucose, Bld: 110 mg/dL — ABNORMAL HIGH (ref 70–99)
Potassium: 4 mEq/L (ref 3.5–5.1)
Sodium: 135 mEq/L (ref 135–145)
Total Bilirubin: 0.5 mg/dL (ref 0.2–1.2)
Total Protein: 6.9 g/dL (ref 6.0–8.3)

## 2019-03-23 MED ORDER — METFORMIN HCL ER 500 MG PO TB24: 500.0000 mg | ORAL_TABLET | Freq: Two times a day (BID) | ORAL | 3 refills | Status: DC

## 2019-03-23 NOTE — Telephone Encounter (Signed)
Refill request from patient pharmacy for Hydroxyzine HCL 25 mg tid. Unsure if this medication is still needed- was prescribed for itching

## 2019-03-24 ENCOUNTER — Encounter: Payer: Self-pay | Admitting: Physician Assistant

## 2019-03-25 ENCOUNTER — Other Ambulatory Visit: Payer: Self-pay | Admitting: Physician Assistant

## 2019-04-05 ENCOUNTER — Telehealth: Payer: Self-pay

## 2019-04-05 NOTE — Telephone Encounter (Signed)
Patient called in with lower abdomen cramping and diarrhea Saturday and Sunday. Patient states"I thought  My sugars were under control but I guess not."She states she is doing better this morning but  is still having some cramping. Her sugars have been running 113, 114 but this morning they were at 149. No complaints of nausea, vomiting, and no temperature. Patient states it is okay to leave a message on her voicemail.

## 2019-04-05 NOTE — Telephone Encounter (Signed)
Glucose levels are actually not bad overall. Suspect the abdominal cramping, etc is potentially related to something she ate or potentially side effect of something she took or potentially viral illness. Recommend video appointment to discuss.

## 2019-04-08 ENCOUNTER — Other Ambulatory Visit: Payer: Self-pay

## 2019-04-08 ENCOUNTER — Ambulatory Visit: Payer: Medicare Other | Admitting: Adult Health

## 2019-04-08 ENCOUNTER — Encounter: Payer: Self-pay | Admitting: Adult Health

## 2019-04-08 DIAGNOSIS — F17210 Nicotine dependence, cigarettes, uncomplicated: Secondary | ICD-10-CM | POA: Diagnosis not present

## 2019-04-08 DIAGNOSIS — J9611 Chronic respiratory failure with hypoxia: Secondary | ICD-10-CM | POA: Diagnosis not present

## 2019-04-08 DIAGNOSIS — J449 Chronic obstructive pulmonary disease, unspecified: Secondary | ICD-10-CM

## 2019-04-08 NOTE — Patient Instructions (Signed)
Continue on Symbicort 2 puffs Twice daily  , rinse after use.  Continue on Sprivia 2 puffs daily . Rinse after use .  Continue on  Oxygen 2l/m At bedtime   Work on not smoking.  Follow up in 3 months with Dr. Melvyn Novas  Or Kayden Hutmacher NP  Please contact office for sooner follow up if symptoms do not improve or worsen or seek emergency care

## 2019-04-08 NOTE — Assessment & Plan Note (Signed)
Smoking cessation  

## 2019-04-08 NOTE — Telephone Encounter (Signed)
Spoke with patient son and advised that her glucose levels are pretty good. He states she was still having the abdominal cramping. Advised we can move her appointment sooner to find out what is causing he abdominal cramping. He will let her know and and she can call back to adjust the appointment

## 2019-04-08 NOTE — Progress Notes (Signed)
@Patient  ID: [name], female    DOB: 1955-05-21, 63 y.o.   MRN: [id number]  Chief Complaint  Patient presents with  . Follow-up    COPD     Referring provider: Delorse Limber  HPI: 64 year old female active smoker seen for initial pulmonary consult February 2019 for shortness of breath found to have moderate COPD with reversibility.  She is oxygen dependent on oxygen at bedtime. She is disabled from orthopedic issues Medical history significant for dyslipidemia, hypertension and anemia  TEST/EVENTS :  Spirometry 2/19/2019FEV1 1.54 (66%) Ratio 75 with min curvature p am symb - Allergy profile 03/18/17 >Eos 0.3 / IgE 4 neg RAST  - PFT's 4/29/2019FEV1 1.60 (63 % ) ratio 67 p 14 % improvement from saba p sym/ spriva prior to study with DLCO 55 % corrects to 63 % for alv volume with EOS up to 0.8 HRCT chest 12/24/2017-no ILD, mild to moderate emphysema Alpha-1 December 05, 2017 normal, MM, 174  04/08/2019 Follow up : COPD , O2 RF  Patient presents for a 99-monthfollow-up.  Patient has underlying COPD.  She says overall her breathing has been doing okay since last visit.  She is had no flare of cough or wheezing.  She remains on Symbicort and Spiriva..  Patient says she has been under some other stress as she was recently diagnosed with new onset diabetes.  She is now having to take new medications for her diabetes and monitor her blood sugars very closely. Patient remains on oxygen 2 L at bedtime. She does continue to smoke.  Smoking cessation was discussed She denies any chest pain orthopnea PND or increased leg swelling   Allergies  Allergen Reactions  . Nsaids Diarrhea  . Aleve [Naproxen Sodium] Other (See Comments)    Headache   . Codeine Nausea Only and Other (See Comments)    GI upset  . Penicillins Nausea Only and Other (See Comments)    GI upset Has patient had a PCN reaction causing immediate rash, facial/tongue/throat swelling, SOB or  lightheadedness with hypotension: No Has patient had a PCN reaction causing severe rash involving mucus membranes or skin necrosis: No Has patient had a PCN reaction that required hospitalization No Has patient had a PCN reaction occurring within the last 10 years: No If all of the above answers are "NO", then may proceed with Cephalosporin use.   . Sulfonamide Derivatives Hives    Immunization History  Administered Date(s) Administered  . Influenza,inj,Quad PF,6+ Mos 10/02/2018  . Influenza-Unspecified 01/16/2011, 11/26/2011, 12/22/2012, 11/17/2013    Past Medical History:  Diagnosis Date  . Allergic rhinitis   . Anemia   . Anxiety   . Chicken pox   . Chronic back pain   . COPD (chronic obstructive pulmonary disease) ([place]   . Depression   . Essential hypertension   . GERD (gastroesophageal reflux disease)   . Headache    migraines  . History of gastritis    EGD 2015  . History of home oxygen therapy    2 liters at hs last 6 months  . Hyperlipidemia   . Hypothyroidism   . Migraines   . Osteoarthritis    oa  . Scoliosis     Tobacco History: Social History   Tobacco Use  Smoking Status Current Every Day Smoker  . Packs/day: 1.50  . Years: 46.00  . Pack years: 69.00  . Types: Cigarettes  Smokeless Tobacco Never Used   Ready to quit: Not Answered Counseling  given: Not Answered   Outpatient Medications Prior to Visit  Medication Sig Dispense Refill  . Accu-Chek Softclix Lancets lancets USE AS DIRECTED FOUR TIMES DAILY 100 each 0  . albuterol (PROAIR HFA) 108 (90 Base) MCG/ACT inhaler 2 puffs every 4 hours as needed only  if your can't catch your breath 6.7 g 0  . ALPRAZolam (XANAX) 1 MG tablet Take 1 tablet (1 mg total) by mouth 4 (four) times daily as needed. for anxiety 120 tablet 0  . atorvastatin (LIPITOR) 20 MG tablet TAKE 1 TABLET BY MOUTH AT BEDTIME 90 tablet 1  . blood glucose meter kit and supplies KIT Dispense based on patient and insurance  preference. Use up to four times daily as directed. (FOR ICD-9 250.00, 250.01). 1 each 0  . DULoxetine (CYMBALTA) 30 MG capsule Take 1 capsule (30 mg total) by mouth daily. 14 capsule 1  . fluticasone (FLONASE) 50 MCG/ACT nasal spray Place 2 sprays into both nostrils daily.    . furosemide (LASIX) 20 MG tablet Take 2 tablets (40 mg total) by mouth daily. 30 tablet 3  . gabapentin (NEURONTIN) 300 MG capsule Take 600 mg by mouth 2 (two) times daily.     . hydrOXYzine (ATARAX/VISTARIL) 25 MG tablet Take 1 tablet (25 mg total) by mouth 3 (three) times daily as needed. 30 tablet 0  . levothyroxine (SYNTHROID) 75 MCG tablet TAKE 1 TABLET BY MOUTH DAILY BEFORE BREAKFAST (Patient taking differently: Take 75 mcg by mouth daily before breakfast. ) 90 tablet 1  . loratadine (CLARITIN) 10 MG tablet Take 10 mg by mouth daily.    . meloxicam (MOBIC) 15 MG tablet Take 15 mg by mouth daily.    . metFORMIN (GLUCOPHAGE XR) 500 MG 24 hr tablet Take 1 tablet (500 mg total) by mouth in the morning and at bedtime. 60 tablet 3  . omeprazole (PRILOSEC) 40 MG capsule TAKE ONE CAPSULE BY MOUTH DAILY BEFORE BREAKFAST and TAKE ONE CAPSULE DAILY BEFORE DINNER 60 capsule 0  . ondansetron (ZOFRAN) 4 MG tablet Take 1 tablet (4 mg total) by mouth every 8 (eight) hours as needed for nausea or vomiting. 10 tablet 0  . oxyCODONE-acetaminophen (PERCOCET) 10-325 MG tablet Take 1 tablet by mouth every 6 (six) hours as needed for pain. 120 tablet 0  . OXYGEN Inhale 2 L into the lungs at bedtime.     . potassium chloride (KLOR-CON) 10 MEQ tablet TAKE 1 TABLET BY MOUTH AT BEDTIME 90 tablet 1  . SYMBICORT 160-4.5 MCG/ACT inhaler INHALE TWO PUFFS BY MOUTH TWICE DAILY (Patient taking differently: Inhale 2 puffs into the lungs 2 (two) times daily. ) 10.2 g 5  . Tiotropium Bromide Monohydrate (SPIRIVA RESPIMAT) 2.5 MCG/ACT AERS Inhale 2 puffs into the lungs daily. 4 g 5  . triamterene-hydrochlorothiazide (MAXZIDE-25) 37.5-25 MG tablet TAKE 1  TABLET BY MOUTH EVERY DAY (Patient taking differently: Take 1 tablet by mouth daily. ) 90 tablet 1   No facility-administered medications prior to visit.     Review of Systems:   Constitutional:   No  weight loss, night sweats,  Fevers, chills,  +fatigue, or  lassitude.  HEENT:   No headaches,  Difficulty swallowing,  Tooth/dental problems, or  Sore throat,                No sneezing, itching, ear ache, nasal congestion, post nasal drip,   CV:  No chest pain,  Orthopnea, PND, swelling in lower extremities, anasarca, dizziness, palpitations, syncope.   GI  No heartburn, indigestion, abdominal pain, nausea, vomiting, diarrhea, change in bowel habits, loss of appetite, bloody stools.   Resp:   No chest wall deformity  Skin: no rash or lesions.  GU: no dysuria, change in color of urine, no urgency or frequency.  No flank pain, no hematuria   MS:  No joint pain or swelling.  No decreased range of motion.  No back pain.    Physical Exam  BP 122/66 (BP Location: Left Arm, Cuff Size: Normal)   Pulse (!) 111   Temp (!) 97.2 F (36.2 C) (Temporal)   Ht 5' 2"  (1.575 m)   Wt 250 lb 12.8 oz (113.8 kg)   SpO2 92% Comment: RA  BMI 45.87 kg/m   GEN: A/Ox3; pleasant , NAD, BMI 45    HEENT:  [place]/AT,    NOSE-clear, THROAT-clear, no lesions, no postnasal drip or exudate noted.   NECK:  Supple w/ fair ROM; no JVD; normal carotid impulses w/o bruits; no thyromegaly or nodules palpated; no lymphadenopathy.    RESP decreased breath sounds in the bases  no accessory muscle use, no dullness to percussion  CARD:  RRR, no m/r/g, no peripheral edema, pulses intact, no cyanosis or clubbing.  GI:   Soft & nt; nml bowel sounds; no organomegaly or masses detected.   Musco: Warm bil, no deformities or joint swelling noted.   Neuro: alert, no focal deficits noted.    Skin: Warm, no lesions or rashes     BMET  BNP  Imaging: No results found.    PFT Results Latest Ref Rng & Units  05/26/2017  FVC-Pre L 2.23  FVC-Predicted Pre % 68  FVC-Post L 2.38  FVC-Predicted Post % 72  Pre FEV1/FVC % % 63  Post FEV1/FCV % % 67  FEV1-Pre L 1.40  FEV1-Predicted Pre % 55  FEV1-Post L 1.60  DLCO UNC% % 55  DLCO COR %Predicted % 63  TLC L 5.32  TLC % Predicted % 105  RV % Predicted % 141    No results found for: NITRICOXIDE      Assessment & Plan:   COPD GOLD II still smoking  Compensated on present regimen Smoking cessation is key  Plan  Patient Instructions  Continue on Symbicort 2 puffs Twice daily  , rinse after use.  Continue on Sprivia 2 puffs daily . Rinse after use .  Continue on  Oxygen 2l/m At bedtime   Work on not smoking.  Follow up in 3 months with Dr. Melvyn Novas  Or Ciearra Rufo NP  Please contact office for sooner follow up if symptoms do not improve or worsen or seek emergency care         Chronic respiratory failure with hypoxia ([place]) Continue on oxygen at bedtime  Cigarette smoker Smoking cessation     Rexene Edison, NP 04/08/2019

## 2019-04-08 NOTE — Assessment & Plan Note (Signed)
Continue on oxygen at bedtime

## 2019-04-08 NOTE — Assessment & Plan Note (Signed)
Compensated on present regimen Smoking cessation is key  Plan  Patient Instructions  Continue on Symbicort 2 puffs Twice daily  , rinse after use.  Continue on Sprivia 2 puffs daily . Rinse after use .  Continue on  Oxygen 2l/m At bedtime   Work on not smoking.  Follow up in 3 months with Dr. Melvyn Novas  Or Alyxis Grippi NP  Please contact office for sooner follow up if symptoms do not improve or worsen or seek emergency care

## 2019-04-12 ENCOUNTER — Ambulatory Visit (INDEPENDENT_AMBULATORY_CARE_PROVIDER_SITE_OTHER): Payer: Medicare Other | Admitting: Physician Assistant

## 2019-04-12 ENCOUNTER — Encounter: Payer: Self-pay | Admitting: Physician Assistant

## 2019-04-12 ENCOUNTER — Other Ambulatory Visit: Payer: Self-pay

## 2019-04-12 DIAGNOSIS — E119 Type 2 diabetes mellitus without complications: Secondary | ICD-10-CM | POA: Insufficient documentation

## 2019-04-12 MED ORDER — SITAGLIPTIN PHOSPHATE 25 MG PO TABS: 25.0000 mg | ORAL_TABLET | Freq: Every day | ORAL | 1 refills | Status: DC

## 2019-04-12 NOTE — Progress Notes (Signed)
Virtual Visit via Telephone Note  I connected with [name] on 04/12/19 at  3:30 PM EDT by telephone and verified that I am speaking with the correct person using two identifiers.  Location: Patient: Home Provider: [facility]   I discussed the limitations, risks, security and privacy concerns of performing an evaluation and management service by telephone and the availability of in person appointments. I also discussed with the patient that there may be a patient responsible charge related to this service. The patient expressed understanding and agreed to proceed.  History of Present Illness: Patient presents today for follow-up of glucose levels after increase in her Metformin XR to 1000 mg daily. Patient endorses taking medications as directed. Has noted some increase in loose stool since increase in dose. Denies nausea/vomiting, melena or hematochezia. Notes occasional lower abdominal cramping with this. Fasting glucose averaging 105-125. Is trying to be more consistent with diet. Is keeping well-hydrated. Denies change in vision since last visit. Foot exam, eye exam and immunizations up-to-date.   Lab Results  Component Value Date   HGBA1C 9.3 (A) 03/22/2019   Observations/Objective: No labored breathing.  Speech is clear and coherent with logical content.  Patient is alert and oriented at baseline.   Assessment and Plan: 1. Diabetes mellitus, new onset ([place]) Fasting glucose much improved but having hard time with increased dose of Metformin CX. Will decrease back to 500 mg daily. Start januvia 25 mg once daily. Keep up with diet and hydration. Continue daily glucose checks. Follow-up 10-14 days via MyChart with glucose update and to see how bowels are responding. If much improved but not completely resolved, would continue titration of Januvia and removing metformin. Could consider trial of Janumet since the branded metformin XR may be more tolerable for her in this  combination.   Follow Up Instructions:  I discussed the assessment and treatment plan with the patient. The patient was provided an opportunity to ask questions and all were answered. The patient agreed with the plan and demonstrated an understanding of the instructions.   The patient was advised to call back or seek an in-person evaluation if the symptoms worsen or if the condition fails to improve as anticipated.  I provided 20 minutes of non-face-to-face time during this encounter.   [name], PA-C

## 2019-04-15 ENCOUNTER — Other Ambulatory Visit: Payer: Self-pay | Admitting: Physician Assistant

## 2019-04-15 NOTE — Telephone Encounter (Signed)
Xanax last rx 03/19/19#120 LOV: 04/12/19 DM CSC: 08/18/17

## 2019-04-16 ENCOUNTER — Telehealth: Payer: Self-pay

## 2019-04-16 ENCOUNTER — Other Ambulatory Visit: Payer: Self-pay | Admitting: Physician Assistant

## 2019-04-16 DIAGNOSIS — E119 Type 2 diabetes mellitus without complications: Secondary | ICD-10-CM

## 2019-04-16 DIAGNOSIS — G894 Chronic pain syndrome: Secondary | ICD-10-CM

## 2019-04-16 NOTE — Telephone Encounter (Signed)
Patient admits she does do late night snacking. When she is watching tv she will snacking Patient states her GI symptoms has improved since decreasing the Metformin.  She wants to continue the [place] for now.  She is agreeable with [facility]. She would like it to be virtual since she doesn't drive too much. Referral placed.

## 2019-04-16 NOTE — Telephone Encounter (Signed)
Indication for chronic opioid: Severe OA of multiple sites, Lumbar Spinal Stenosis, Chronic pain syndrome Medication and dose: Oxycodone-APAP 10/325 # pills per month: 120 on 03/19/19 Last UDS date: 08/31/18 Opioid Treatment Agreement signed (Y/N): No-pain medication Opioid Treatment Agreement last reviewed with patient:   [facility] reviewed this encounter (include red flags):     LOV: 04/12/19 DM

## 2019-04-16 NOTE — Telephone Encounter (Signed)
No late-night snacking when she was not able to sleep? If not then we would need to make further adjustments. How are GI symptoms after decreasing Metformin?  We may need to switch Januvia to another medication since she is having myalgias with it. Oftentimes these go away once body adjusts to medication which can take a week. Does she want to switch now or give a few more days?

## 2019-04-16 NOTE — Telephone Encounter (Signed)
Spoke with patient about her blood sugar readings with Januvia and Metformin This morning BS 283 before meal She ate Cheerios-breakfast and [employer] sandwich-for dinner She didn't sleep well night. Maybe only slept 4 hrs. She checks her blood sugars fasting before breakfast and after supper (maybe 2 hrs) Started Januvia on Tuesday after appointment on Monday. Feeling achy and hurting on the Januvia.

## 2019-04-16 NOTE — Telephone Encounter (Signed)
Patient is concerned about her glucose levels. They were in the 180's last night after dinner. This morning her reading was 283 before breakfast.

## 2019-04-17 ENCOUNTER — Other Ambulatory Visit: Payer: Self-pay | Admitting: Physician Assistant

## 2019-04-20 ENCOUNTER — Other Ambulatory Visit: Payer: Self-pay | Admitting: Gastroenterology

## 2019-04-23 ENCOUNTER — Telehealth: Payer: Self-pay | Admitting: Physician Assistant

## 2019-04-23 NOTE — Telephone Encounter (Signed)
Spoke with patient about her blood sugars. From the last visit PCP adjusted Metformin from 1000 mg to 500 mg daily and Januvia daily. She states her bowels has improved some. She is still having some loose stool. Her FBS 159 and 189 2 hours after eating.  She is worried about her blood sugars.  Reassured that her bs are stable and she is taking her medications as directed  She wanted to go back to taking Metformin and stopping the Januvia. Advised to continue current medications as directed. She says the Januvia is more expensive. Metformin is affordable medication for her.  She has been taking her sons rx Immodium AD for the loose stools. She is also lactose intolerance and had a milk shake.   04/17/19  FBS 137, PM 2 hrs 238  04/18/19  FBS 253, PM 2 hrs 137 04/19/19  FBS 127, PM 2 hrs 170 04/20/19       PM 2 hrs 206 04/21/19 FBS 150,  PM 2 hrs 189 04/22/19 FBS 159,  PM 2 hrs  Started 1 week ago, having some lower leg itching and some swelling. Patient states she is staying hydrated and walking some around the house but not much due to back pain and decrease mobility. She states it does improve with applying the morsturizer lotion to legs. Denies any chest pains or sob.

## 2019-04-23 NOTE — Telephone Encounter (Signed)
Pt called in stating that he blood sugar keeps going up. She is taking Metformin in the morning and she is taking the Januvia in the evening. She states that her blood sugar this morning was 179 and it keeps going up. She thinks she needs to stop the Januvia.    Lower legs feel like leather there is some swelling and they are itching. Please advise   Call the home #

## 2019-04-23 NOTE — Telephone Encounter (Signed)
She said it was 189 last night after dinner and taking her medication.

## 2019-04-26 NOTE — Telephone Encounter (Signed)
Would recommend video visit at least to discuss leg swelling to make sure no concern for clot. We can also discuss her medications -- hesitant to increase metformin again due to significant GI issues. There are other options available.

## 2019-04-27 NOTE — Telephone Encounter (Signed)
Pt called back checking on this, I have scheduled an appt about the leg swelling.

## 2019-04-27 NOTE — Telephone Encounter (Signed)
Patient scheduled for appointment on 04/28/19. Will discuss legs and diabetes medication

## 2019-04-27 NOTE — Telephone Encounter (Signed)
Pt states that she only has 1 more tablet of the Januvia.

## 2019-04-28 ENCOUNTER — Other Ambulatory Visit: Payer: Self-pay

## 2019-04-28 ENCOUNTER — Ambulatory Visit (INDEPENDENT_AMBULATORY_CARE_PROVIDER_SITE_OTHER): Payer: Medicare Other | Admitting: Physician Assistant

## 2019-04-28 ENCOUNTER — Encounter: Payer: Self-pay | Admitting: Physician Assistant

## 2019-04-28 DIAGNOSIS — E119 Type 2 diabetes mellitus without complications: Secondary | ICD-10-CM | POA: Diagnosis not present

## 2019-04-28 DIAGNOSIS — R609 Edema, unspecified: Secondary | ICD-10-CM

## 2019-04-28 MED ORDER — FUROSEMIDE 20 MG PO TABS: 20.0000 mg | ORAL_TABLET | Freq: Every day | ORAL | 0 refills | Status: DC | PRN

## 2019-04-28 MED ORDER — METFORMIN HCL ER 500 MG PO TB24: 1000.0000 mg | ORAL_TABLET | Freq: Every day | ORAL | 1 refills | Status: DC

## 2019-04-28 NOTE — Progress Notes (Signed)
Virtual Visit via Telephone Note  I connected with [name] on 04/28/19 at 10:30 AM EDT by telephone and verified that I am speaking with the correct [name] using two identifiers.  Location: Patient: Home Provider: [facility]   I discussed the limitations, risks, security and privacy concerns of performing an evaluation and management service by telephone and the availability of in [name] appointments. I also discussed with the patient that there may be a patient responsible charge related to this service. The patient expressed understanding and agreed to proceed.  History of Present Illness: Patient presents via phone today as she cannot connect to video platform. Patient following up regarding change in diabetes medication and her peripheral edema.   Patient endorses taking the Metformin and Januvia as directed. BS running 127-200 fasting. 170-260s non-fasting. Notes she is still snacking throughout the night on occasion, reflecting the higher AM sugars. No exercise at present. States Januvia is 20.00 and is too expensive for her. Would like to try higher dose of Metformin again as she is doing better with diet and avoiding dairy (lactose-intolerant). Previously noting loose stools with Metformin dose > 1000 mg total per day.  Patient notes swelling is much improved. Has had some itching of her legs without rash, weeping. She is trying to ambulate more at home. Only occasionally elevating legs while resting. Denies SOB, PND, orthopnea.    Observations/Objective: No labored breathing.  Speech is clear and coherent with logical content.  Patient is alert and oriented at baseline.   Assessment and Plan: 1. Diabetes mellitus, new onset ([place]) Reviewed need to avoid late-night snacking as this is having impact on AM sugars. Is being more consistent throughout the day. She is to keep up with this. Continue checking glucose as directed and recording. Giving her concerns we will hold the Januvia  and re-attempt Metformin with a branded XR Metformin 1000 mg daily. Follow-up scheduled for reassessment. Plan to increase if tolerating well.   2. Peripheral edema DASH diet reviewed. Keep working on ambulation to help pump fluid up. Elevate legs while resting. Restart Furosemide 20 mg daily for 3 days, then as needed. Follow-up discussed.    Follow Up Instructions:  I discussed the assessment and treatment plan with the patient. The patient was provided an opportunity to ask questions and all were answered. The patient agreed with the plan and demonstrated an understanding of the instructions.   The patient was advised to call back or seek an in-[name] evaluation if the symptoms worsen or if the condition fails to improve as anticipated.  I provided 15 minutes of non-face-to-face time during this encounter.   [name], PA-C

## 2019-04-28 NOTE — Progress Notes (Signed)
I have discussed the procedure for the virtual visit with the patient who has given consent to proceed with assessment and treatment.   [name] S Elexia Friedt, CMA     

## 2019-04-29 ENCOUNTER — Emergency Department (HOSPITAL_COMMUNITY): Payer: Medicare Other

## 2019-04-29 ENCOUNTER — Encounter (HOSPITAL_COMMUNITY): Payer: Self-pay | Admitting: Emergency Medicine

## 2019-04-29 ENCOUNTER — Emergency Department (HOSPITAL_COMMUNITY)
Admission: EM | Admit: 2019-04-29 | Discharge: 2019-04-29 | Disposition: A | Discharge status: Home / Self Care | Payer: Medicare Other | Attending: Emergency Medicine | Admitting: Emergency Medicine

## 2019-04-29 ENCOUNTER — Other Ambulatory Visit: Payer: Self-pay | Admitting: Physician Assistant

## 2019-04-29 ENCOUNTER — Other Ambulatory Visit: Payer: Self-pay

## 2019-04-29 DIAGNOSIS — S8992XA Unspecified injury of left lower leg, initial encounter: Secondary | ICD-10-CM | POA: Diagnosis not present

## 2019-04-29 DIAGNOSIS — Z79899 Other long term (current) drug therapy: Secondary | ICD-10-CM | POA: Insufficient documentation

## 2019-04-29 DIAGNOSIS — M1990 Unspecified osteoarthritis, unspecified site: Secondary | ICD-10-CM | POA: Diagnosis not present

## 2019-04-29 DIAGNOSIS — M1712 Unilateral primary osteoarthritis, left knee: Secondary | ICD-10-CM | POA: Diagnosis not present

## 2019-04-29 DIAGNOSIS — M25462 Effusion, left knee: Secondary | ICD-10-CM | POA: Insufficient documentation

## 2019-04-29 DIAGNOSIS — J449 Chronic obstructive pulmonary disease, unspecified: Secondary | ICD-10-CM | POA: Diagnosis not present

## 2019-04-29 DIAGNOSIS — Z7984 Long term (current) use of oral hypoglycemic drugs: Secondary | ICD-10-CM | POA: Insufficient documentation

## 2019-04-29 DIAGNOSIS — I10 Essential (primary) hypertension: Secondary | ICD-10-CM | POA: Diagnosis not present

## 2019-04-29 DIAGNOSIS — E039 Hypothyroidism, unspecified: Secondary | ICD-10-CM | POA: Diagnosis not present

## 2019-04-29 DIAGNOSIS — E119 Type 2 diabetes mellitus without complications: Secondary | ICD-10-CM | POA: Insufficient documentation

## 2019-04-29 DIAGNOSIS — F17210 Nicotine dependence, cigarettes, uncomplicated: Secondary | ICD-10-CM | POA: Insufficient documentation

## 2019-04-29 DIAGNOSIS — R2242 Localized swelling, mass and lump, left lower limb: Secondary | ICD-10-CM | POA: Diagnosis present

## 2019-04-29 LAB — BASIC METABOLIC PANEL
Anion gap: 8 (ref 5–15)
BUN: 15 mg/dL (ref 8–23)
CO2: 27 mmol/L (ref 22–32)
Calcium: 8.6 mg/dL — ABNORMAL LOW (ref 8.9–10.3)
Chloride: 104 mmol/L (ref 98–111)
Creatinine, Ser: 0.77 mg/dL (ref 0.44–1.00)
GFR calc Af Amer: >60 mL/min (ref >60)
GFR calc non Af Amer: >60 mL/min (ref >60)
Glucose, Bld: 152 mg/dL — ABNORMAL HIGH (ref 70–99)
Potassium: 4.1 mmol/L (ref 3.5–5.1)
Sodium: 139 mmol/L (ref 135–145)

## 2019-04-29 LAB — CBC
HCT: 35.1 % — ABNORMAL LOW (ref 36.0–46.0)
Hemoglobin: 10.1 g/dL — ABNORMAL LOW (ref 12.0–15.0)
MCH: 26.9 pg (ref 26.0–34.0)
MCHC: 28.8 g/dL — ABNORMAL LOW (ref 30.0–36.0)
MCV: 93.6 fL (ref 80.0–100.0)
Platelets: 178 10*3/uL (ref 150–400)
RBC: 3.75 MIL/uL — ABNORMAL LOW (ref 3.87–5.11)
RDW: 17.2 % — ABNORMAL HIGH (ref 11.5–15.5)
WBC: 7.5 10*3/uL (ref 4.0–10.5)
nRBC: 0 % (ref 0.0–0.2)

## 2019-04-29 LAB — CBG MONITORING, ED: Glucose-Capillary: 153 mg/dL — ABNORMAL HIGH (ref 70–99)

## 2019-04-29 NOTE — ED Provider Notes (Signed)
[facility] EMERGENCY DEPARTMENT Provider Note   CSN: [id number] Arrival date & time: 04/29/19  1935     History Chief Complaint  Patient presents with  . Leg Swelling    [name] is a 64 y.o. female.  The history is provided by the patient. No language interpreter was used.       Past Medical History:  Diagnosis Date  . Allergic rhinitis   . Anemia   . Anxiety   . Chicken pox   . Chronic back pain   . COPD (chronic obstructive pulmonary disease) ([place])   . Depression   . Essential hypertension   . GERD (gastroesophageal reflux disease)   . Headache    migraines  . History of gastritis    EGD 2015  . History of home oxygen therapy    2 liters at hs last 6 months  . Hyperlipidemia   . Hypothyroidism   . Migraines   . Osteoarthritis    oa  . Scoliosis     Patient Active Problem List   Diagnosis Date Noted  . Diabetes mellitus, new onset ([employer]) 04/12/2019  . Tremor 11/05/2018  . Chronic respiratory failure with hypoxia ([place]) 01/12/2018  . GERD with esophagitis 11/26/2017  . Dysphagia 08/20/2017  . Chronic pain syndrome 08/04/2017  . Comorbid sleep-related hypoventilation 08/04/2017  . OSA (obstructive sleep apnea) 08/04/2017  . Hepatic steatosis 08/04/2017  . Polyp of ascending colon   . Diverticulosis of colon without hemorrhage   . Gastritis and gastroduodenitis   . Hypothyroidism 07/22/2017  . Anxiety and depression 07/22/2017  . Iron deficiency anemia 07/22/2017  . Pilar cyst 07/22/2017  . Morbid obesity due to excess calories ([place]) 03/19/2017  . Anemia 03/19/2017  . COPD GOLD II still smoking  03/18/2017  . Degenerative lumbar spinal stenosis 02/17/2017  . S/P right hip revision 09/02/2016  . S/P hip replacement 09/02/2016  . Instability of prosthetic hip ([place]) 03/01/2016  . Dislocation of hip prosthesis ([place]) 02/17/2016  . Abnormal nuclear stress test: Intermediate Risk 10/31/2015  . Atypical angina ([place]) 10/31/2015  . Trigger finger,  acquired 08/20/2012  . Cigarette smoker 12/12/2008  . Hyperlipidemia 11/17/2008  . HYPERTENSION, BENIGN 11/16/2008  . GEN OSTEOARTHROSIS INVOLVING MULTIPLE SITES 11/16/2008    Past Surgical History:  Procedure Laterality Date  . APPENDECTOMY     1985  . BIOPSY  07/24/2017   Procedure: BIOPSY;  Surgeon: Milus Banister, MD;  Location: [facility];  Service: Endoscopy;;  . CARDIAC CATHETERIZATION N/A 10/31/2015   Procedure: Left Heart Cath and Coronary Angiography;  Surgeon: Leonie Man, MD;  Location: [facility];  Service: Cardiovascular;  Laterality: N/A;  . CARPAL TUNNEL RELEASE Left   . CARPAL TUNNEL RELEASE Right   . CHOLECYSTECTOMY  late 1980's  . COLONOSCOPY WITH PROPOFOL N/A 07/24/2017   Procedure: COLONOSCOPY WITH PROPOFOL;  Surgeon: Milus Banister, MD;  Location: [facility];  Service: Endoscopy;  Laterality: N/A;  . ESOPHAGOGASTRODUODENOSCOPY N/A 07/24/2017   Procedure: ESOPHAGOGASTRODUODENOSCOPY (EGD);  Surgeon: Milus Banister, MD;  Location: [facility];  Service: Endoscopy;  Laterality: N/A;  . GALLBLADDER SURGERY  1991  . HIP CLOSED REDUCTION Right 01/08/2016   Procedure: CLOSED MANIPULATION HIP;  Surgeon: Susa Day, MD;  Location: [facility];  Service: Orthopedics;  Laterality: Right;  . HIP CLOSED REDUCTION Right 01/19/2016   Procedure: ATTEMPTED CLOSED REDUCTION RIGHT HIP;  Surgeon: Wylene Simmer, MD;  Location: [facility];  Service: Orthopedics;  Laterality: Right;  .  HIP CLOSED REDUCTION Right 01/20/2016   Procedure: CLOSED REDUCTION RIGHT TOTAL HIP;  Surgeon: Paralee Cancel, MD;  Location: [facility];  Service: Orthopedics;  Laterality: Right;  . HIP CLOSED REDUCTION Right 02/17/2016   Procedure: CLOSED REDUCTION RIGHT TOTAL HIP;  Surgeon: Rod Can, MD;  Location: [facility];  Service: Orthopedics;  Laterality: Right;  . HIP CLOSED REDUCTION Right 02/28/2016   Procedure: CLOSED REDUCTION HIP;  Surgeon: Nicholes Stairs, MD;  Location: [facility];  Service:  Orthopedics;  Laterality: Right;  . POLYPECTOMY  07/24/2017   Procedure: POLYPECTOMY;  Surgeon: Milus Banister, MD;  Location: [facility];  Service: Endoscopy;;  . TONSILLECTOMY    . TOTAL ABDOMINAL HYSTERECTOMY     1985, with 1 ovary removed and 2 nd ovary removed 2003  . TOTAL HIP ARTHROPLASTY Right    Original surgery 2006 with revision 2010  . TOTAL HIP REVISION Right 01/01/2016   Procedure: TOTAL HIP REVISION;  Surgeon: Paralee Cancel, MD;  Location: [facility];  Service: Orthopedics;  Laterality: Right;  . TOTAL HIP REVISION Right 03/02/2016   Procedure: TOTAL HIP REVISION;  Surgeon: Paralee Cancel, MD;  Location: [facility];  Service: Orthopedics;  Laterality: Right;  . TOTAL HIP REVISION Right 09/02/2016   Procedure: Right hip constrained liner- posterior;  Surgeon: Paralee Cancel, MD;  Location: [facility];  Service: Orthopedics;  Laterality: Right;  . ULNAR NERVE TRANSPOSITION Right      OB History   No obstetric history on file.     Family History  Problem Relation Age of Onset  . COPD Mother   . Heart disease Mother   . Lung disease Father        Asbestosis  . Heart attack Father   . Heart disease Father   . Cerebral aneurysm Brother   . Aneurysm Brother        Brain  . Epilepsy Son   . Arthritis Maternal Grandmother   . Heart disease Maternal Grandmother   . Asthma Maternal Grandfather   . Cancer Maternal Grandfather   . Arthritis Paternal Grandmother   . Heart disease Paternal Grandmother   . Stroke Paternal Grandmother   . Early death Paternal Grandfather   . Heart disease Paternal Grandfather     Social History   Tobacco Use  . Smoking status: Current Every Day Smoker    Packs/day: 1.50    Years: 46.00    Pack years: 69.00    Types: Cigarettes  . Smokeless tobacco: Never Used  Substance Use Topics  . Alcohol use: No  . Drug use: No    Home Medications Prior to Admission medications   Medication Sig Start Date End Date Taking? Authorizing Provider  ACCU-CHEK  GUIDE test strip USE AS DIRECTED FOUR TIMES DAILY 04/19/19   Brunetta Jeans, PA-C  Accu-Chek Softclix Lancets lancets USE AS DIRECTED FOUR TIMES DAILY 03/25/19   Brunetta Jeans, PA-C  albuterol [facility] HFA) [phone] Base) MCG/ACT inhaler 2 puffs every 4 hours as needed only  if your can't catch your breath 10/28/18   Brunetta Jeans, PA-C  ALPRAZolam Duanne Moron) 1 MG tablet TAKE 1 TABLET BY MOUTH FOUR TIMES DAILY AS NEEDED FOR ANXIETY 04/16/19   Brunetta Jeans, PA-C  atorvastatin (LIPITOR) 20 MG tablet TAKE 1 TABLET BY MOUTH AT BEDTIME 02/15/19   Brunetta Jeans, PA-C  blood glucose meter kit and supplies KIT Dispense based on patient and insurance preference. Use up to four times daily as directed. (FOR  ICD-9 250.00, 250.01). 03/10/19   Brunetta Jeans, PA-C  DULoxetine (CYMBALTA) 60 MG capsule Take 60 mg by mouth daily. 03/29/19   [provider]  fluticasone (FLONASE) 50 MCG/ACT nasal spray Place 2 sprays into both nostrils daily.    [provider]  furosemide (LASIX) 20 MG tablet Take 1 tablet (20 mg total) by mouth daily as needed. 04/28/19   Brunetta Jeans, PA-C  gabapentin (NEURONTIN) 300 MG capsule Take 600 mg by mouth 2 (two) times daily.     [provider]  levothyroxine (SYNTHROID) 75 MCG tablet TAKE 1 TABLET BY MOUTH DAILY BEFORE BREAKFAST Patient taking differently: Take 75 mcg by mouth daily before breakfast.  11/25/18   Brunetta Jeans, PA-C  loratadine (CLARITIN) 10 MG tablet Take 10 mg by mouth daily.    [provider]  meloxicam (MOBIC) 15 MG tablet Take 15 mg by mouth daily. 10/26/18   [provider]  metFORMIN (GLUCOPHAGE XR) 500 MG 24 hr tablet Take 2 tablets (1,000 mg total) by mouth daily with breakfast. 04/28/19   Brunetta Jeans, PA-C  omeprazole (PRILOSEC) 40 MG capsule TAKE ONE CAPSULE BY MOUTH DAILY BEFORE BREAKFAST and TAKE ONE CAPSULE DAILY BEFORE DINNER 04/20/19   Milus Banister, MD  ondansetron (ZOFRAN) 4 MG  tablet Take 1 tablet (4 mg total) by mouth every 8 (eight) hours as needed for nausea or vomiting. 03/10/19   Brunetta Jeans, PA-C  oxyCODONE-acetaminophen (PERCOCET) 10-325 MG tablet TAKE 1 TABLET BY MOUTH EVERY 6 HOURS AS NEEDED FOR PAIN 04/16/19   Brunetta Jeans, PA-C  OXYGEN Inhale 2 L into the lungs at bedtime.     [provider]  potassium chloride (KLOR-CON) 10 MEQ tablet TAKE 1 TABLET BY MOUTH AT BEDTIME 01/20/19   Brunetta Jeans, PA-C  sitaGLIPtin (JANUVIA) 25 MG tablet Take 1 tablet (25 mg total) by mouth daily. 04/12/19   Brunetta Jeans, PA-C  SYMBICORT 160-4.5 MCG/ACT inhaler INHALE TWO PUFFS BY MOUTH TWICE DAILY Patient taking differently: Inhale 2 puffs into the lungs 2 (two) times daily.  10/15/18   Tanda Rockers, MD  Tiotropium Bromide Monohydrate (SPIRIVA RESPIMAT) 2.5 MCG/ACT AERS Inhale 2 puffs into the lungs daily. 01/04/19   Parrett, Fonnie Mu, NP  triamterene-hydrochlorothiazide (MAXZIDE-25) 37.5-25 MG tablet TAKE 1 TABLET BY MOUTH EVERY DAY Patient taking differently: Take 1 tablet by mouth daily.  12/17/18   Brunetta Jeans, PA-C    Allergies    Nsaids, Aleve [naproxen sodium], Codeine, Penicillins, and Sulfonamide derivatives  Review of Systems   Review of Systems  All other systems reviewed and are negative.   Physical Exam Updated Vital Signs BP (!) 131/57   Pulse 79   Temp 98.4 F (36.9 C) (Oral)   Resp 18   Ht 5' 2"  (1.575 m)   Wt 113.4 kg   SpO2 91%   BMI 45.73 kg/m   Physical Exam Vitals reviewed.  Cardiovascular:     Rate and Rhythm: Normal rate.     Pulses: Normal pulses.  Pulmonary:     Effort: Pulmonary effort is normal.  Musculoskeletal:        General: Swelling and tenderness present.     Comments: Tender left knee,  Moderate effusion,  Pain with moving. nv and ns intact   Skin:    General: Skin is warm.  Neurological:     General: No focal deficit present.     Mental Status: She is alert.  Psychiatric:         Mood and Affect: Mood normal.     ED Results / Procedures / Treatments   Labs (all labs ordered are listed, but only abnormal results are displayed) Labs Reviewed  CBC - Abnormal; Notable for the following components:      Result Value   RBC 3.75 (*)    Hemoglobin 10.1 (*)    HCT 35.1 (*)    MCHC 28.8 (*)    RDW 17.2 (*)    All other components within normal limits  BASIC METABOLIC PANEL - Abnormal; Notable for the following components:   Glucose, Bld 152 (*)    Calcium 8.6 (*)    All other components within normal limits  CBG MONITORING, ED - Abnormal; Notable for the following components:   Glucose-Capillary 153 (*)    All other components within normal limits  CBG MONITORING, ED    EKG None  Radiology DG Knee Complete 4 Views Left  Result Date: 04/29/2019 CLINICAL DATA:  Fall with knee pain EXAM: LEFT KNEE - COMPLETE 4+ VIEW COMPARISON:  None. FINDINGS: No fracture or malalignment. Moderate joint space narrowing laterally with subarticular sclerosis. Mild medial and moderate patellofemoral degenerative change. Small knee effusion. Joint space calcification IMPRESSION: 1. No acute osseous abnormality 2. Arthritis of the knee with knee effusion 3. Chondrocalcinosis Electronically Signed   By: Donavan Foil M.D.   On: 04/29/2019 21:12    Procedures Procedures (including critical care time)  Medications Ordered in ED Medications - No data to display  ED Course  I have reviewed the triage vital signs and the nursing notes.  Pertinent labs & imaging results that were available during my care of the patient were reviewed by me and considered in my medical decision making (see chart for details).    MDM Rules/Calculators/A&P                      MDM:  Xray shows degenerative changes and effusion  Final Clinical Impression(s) / ED Diagnoses Final diagnoses:  Effusion, left knee  Osteoarthritis, unspecified osteoarthritis type, unspecified site    Rx / DC Orders ED  Discharge Orders    None    An After Visit Summary was printed and given to the patient.    Fransico Meadow, Hershal Coria 04/29/19 2321    Milton Ferguson, MD 04/30/19 1036

## 2019-04-29 NOTE — Discharge Instructions (Signed)
See Dr. Alvan Dame for recheck

## 2019-04-29 NOTE — ED Triage Notes (Signed)
Patient states edema in bilateral legs that started 1 week ago. Patient has a hx of COPD and is on 2 liters of oxygen at night only. Current oxygen  sats are 93 percent on room air. Patient not in any respiratory distress.

## 2019-04-29 NOTE — ED Notes (Signed)
Patient states that she did have a recent fall today and is having left knee pain.

## 2019-05-11 ENCOUNTER — Other Ambulatory Visit: Payer: Self-pay | Admitting: Physician Assistant

## 2019-05-18 ENCOUNTER — Telehealth: Payer: Self-pay | Admitting: Physician Assistant

## 2019-05-18 NOTE — Progress Notes (Signed)
  Chronic Care Management   Outreach Note  05/18/2019 Name: DESTENI PISCOPO MRN: [id number] DOB: [birth year]  Referred by: Brunetta Jeans, PA-C Reason for referral : No chief complaint on file.   An unsuccessful telephone outreach was attempted today. The patient was referred to the pharmacist for assistance with care management and care coordination.   Follow Up Plan:   Earney [place] Upstream Scheduler

## 2019-05-19 ENCOUNTER — Other Ambulatory Visit: Payer: Self-pay | Admitting: Physician Assistant

## 2019-05-20 ENCOUNTER — Other Ambulatory Visit: Payer: Self-pay | Admitting: Gastroenterology

## 2019-05-20 NOTE — Telephone Encounter (Signed)
Xanax last rx 04/16/19 @120  LOV: 04/28/19 DM CSC: 08/21/17

## 2019-05-21 ENCOUNTER — Other Ambulatory Visit: Payer: Self-pay | Admitting: Physician Assistant

## 2019-05-24 ENCOUNTER — Telehealth: Payer: Self-pay | Admitting: Physician Assistant

## 2019-05-24 ENCOUNTER — Other Ambulatory Visit: Payer: Self-pay | Admitting: Emergency Medicine

## 2019-05-24 MED ORDER — DULOXETINE HCL 60 MG PO CPEP: 60.0000 mg | ORAL_CAPSULE | Freq: Every day | ORAL | 1 refills | Status: DC

## 2019-05-24 NOTE — Progress Notes (Signed)
  Chronic Care Management   Note  05/24/2019 Name: NAOMA BOXELL MRN: [id number] DOB: 11-10-1955  JAYLENNE HAMELIN is a 64 y.o. year old female who is a primary care patient of Delorse Limber. I reached out to [name] by phone today in response to a referral sent by Ms. Korbin T Mcray's PCP, Brunetta Jeans, PA-C.   Ms. Kaseman was given information about Chronic Care Management services today including:  1. CCM service includes personalized support from designated clinical staff supervised by her physician, including individualized plan of care and coordination with other care providers 2. 24/7 contact phone numbers for assistance for urgent and routine care needs. 3. Service will only be billed when office clinical staff spend 20 minutes or more in a month to coordinate care. 4. Only one practitioner may furnish and bill the service in a calendar month. 5. The patient may stop CCM services at any time (effective at the end of the month) by phone call to the office staff.   Patient agreed to services and verbal consent obtained.   This note is not being shared with the patient for the following reason: To respect privacy (The patient or proxy has requested that the information not be shared). Follow up plan:   Earney Hamburg Upstream Scheduler

## 2019-05-26 ENCOUNTER — Other Ambulatory Visit: Payer: Self-pay | Admitting: Physician Assistant

## 2019-05-31 ENCOUNTER — Ambulatory Visit (INDEPENDENT_AMBULATORY_CARE_PROVIDER_SITE_OTHER): Payer: Medicare Other | Admitting: Physician Assistant

## 2019-05-31 ENCOUNTER — Other Ambulatory Visit: Payer: Self-pay

## 2019-05-31 ENCOUNTER — Encounter: Payer: Self-pay | Admitting: Physician Assistant

## 2019-05-31 VITALS — BP 124/80 | HR 99 | Temp 97.9°F | Resp 16 | Ht 62.0 in | Wt 244.0 lb

## 2019-05-31 DIAGNOSIS — R609 Edema, unspecified: Secondary | ICD-10-CM

## 2019-05-31 DIAGNOSIS — M48061 Spinal stenosis, lumbar region without neurogenic claudication: Secondary | ICD-10-CM

## 2019-05-31 DIAGNOSIS — G894 Chronic pain syndrome: Secondary | ICD-10-CM

## 2019-05-31 MED ORDER — HYDROXYZINE HCL 10 MG PO TABS: 10.0000 mg | ORAL_TABLET | Freq: Three times a day (TID) | ORAL | 0 refills | Status: DC | PRN

## 2019-05-31 MED ORDER — HYDROCODONE-ACETAMINOPHEN 10-325 MG PO TABS: 1.0000 | ORAL_TABLET | Freq: Three times a day (TID) | ORAL | 0 refills | Status: AC | PRN

## 2019-05-31 NOTE — Patient Instructions (Signed)
Please elevate legs while resting.  Please work on the dietary changes we discussed today in office to start cutting out some of the sodium you are getting daily. This is contributing to swelling.   For T, W and Th, take 2 tablets (40 mg) of your Lasix. Then resume normal dosing along with your dietary changes.  Let's follow-up in 2 weeks to reassess things.  I have also changed your pain medication from Percocet down to hydrocodone-acetaminophen as our goal is to control symptoms with the least amount of medication needed.   Hang in there!

## 2019-05-31 NOTE — Progress Notes (Signed)
Patient presents to clinic today to discuss multiple concerns.  Patient with chronic pain syndrome secondary to OA, chronic lumbago and fibromyalgia, currently on a regimen of Percocet 10-325 mg taken up to 4 times daily.  States since she has been back on her Cymbalta symptoms have improved.  Would like to discuss weaning down from Percocet to hydrocodone as she states that Percocet makes her feel groggy.  States she had some leftover hydrocodone which she took instead of her oxycodone and noted this helped her pain but without causing mental fogginess and somnolence.  Would like to see if it is a possibility to switch to this.  Patient also endorses rash of legs bilaterally with left greater than right.  Notes located on shins.  Has been associated with her peripheral edema.  Has noted this is improved with taking her furosemide daily but she is still having some mild swelling and weeping of the skin of her shins.  Denies any PND orthopnea.  COPD at baseline currently.  Patient does endorse although she has been doing well with a low-carb diet, her diet does have a high sodium content.  Past Medical History:  Diagnosis Date  . Allergic rhinitis   . Anemia   . Anxiety   . Chicken pox   . Chronic back pain   . COPD (chronic obstructive pulmonary disease) ([place])   . Depression   . Essential hypertension   . GERD (gastroesophageal reflux disease)   . Headache    migraines  . History of gastritis    EGD 2015  . History of home oxygen therapy    2 liters at hs last 6 months  . Hyperlipidemia   . Hypothyroidism   . Migraines   . Osteoarthritis    oa  . Scoliosis     Current Outpatient Medications on File Prior to Visit  Medication Sig Dispense Refill  . ACCU-CHEK GUIDE test strip USE AS DIRECTED FOUR TIMES DAILY 100 strip 11  . Accu-Chek Softclix Lancets lancets USE AS DIRECTED FOUR TIMES DAILY 100 each 6  . albuterol (PROAIR HFA) 108 (90 Base) MCG/ACT inhaler 2 puffs every 4 hours  as needed only  if your can't catch your breath 6.7 g 0  . ALPRAZolam (XANAX) 1 MG tablet TAKE 1 TABLET BY MOUTH FOUR TIMES DAILY AS NEEDED FOR ANXIETY 120 tablet 0  . atorvastatin (LIPITOR) 20 MG tablet TAKE 1 TABLET BY MOUTH AT BEDTIME 90 tablet 1  . blood glucose meter kit and supplies KIT Dispense based on patient and insurance preference. Use up to four times daily as directed. (FOR ICD-9 250.00, 250.01). 1 each 0  . DULoxetine (CYMBALTA) 60 MG capsule Take 1 capsule (60 mg total) by mouth daily. 90 capsule 1  . fluticasone (FLONASE) 50 MCG/ACT nasal spray Place 2 sprays into both nostrils daily.    . furosemide (LASIX) 20 MG tablet Take 1 tablet (20 mg total) by mouth daily as needed. 30 tablet 0  . gabapentin (NEURONTIN) 300 MG capsule Take 600 mg by mouth 2 (two) times daily.     Marland Kitchen JANUVIA 25 MG tablet TAKE 1 TABLET BY MOUTH DAILY 30 tablet 1  . levothyroxine (SYNTHROID) 75 MCG tablet TAKE 1 TABLET BY MOUTH DAILY BEFORE BREAKFAST 90 tablet 1  . loratadine (CLARITIN) 10 MG tablet Take 10 mg by mouth daily.    . meloxicam (MOBIC) 15 MG tablet Take 15 mg by mouth daily.    . metFORMIN (GLUCOPHAGE XR) 500 MG  24 hr tablet Take 2 tablets (1,000 mg total) by mouth daily with breakfast. 60 tablet 1  . omeprazole (PRILOSEC) 40 MG capsule TAKE ONE CAPSULE BY MOUTH DAILY BEFORE BREAKFAST and TAKE ONE CAPSULE DAILY BEFORE DINNER 60 capsule 1  . ondansetron (ZOFRAN) 4 MG tablet Take 1 tablet (4 mg total) by mouth every 8 (eight) hours as needed for nausea or vomiting. 10 tablet 0  . oxyCODONE-acetaminophen (PERCOCET) 10-325 MG tablet TAKE 1 TABLET BY MOUTH EVERY 6 HOURS AS NEEDED FOR PAIN 120 tablet 0  . OXYGEN Inhale 2 L into the lungs at bedtime.     . potassium chloride (KLOR-CON) 10 MEQ tablet TAKE 1 TABLET BY MOUTH AT BEDTIME 90 tablet 1  . SYMBICORT 160-4.5 MCG/ACT inhaler INHALE TWO PUFFS BY MOUTH TWICE DAILY (Patient taking differently: Inhale 2 puffs into the lungs 2 (two) times daily. ) 10.2 g  5  . Tiotropium Bromide Monohydrate (SPIRIVA RESPIMAT) 2.5 MCG/ACT AERS Inhale 2 puffs into the lungs daily. 4 g 5  . triamterene-hydrochlorothiazide (MAXZIDE-25) 37.5-25 MG tablet TAKE 1 TABLET BY MOUTH EVERY DAY (Patient taking differently: Take 1 tablet by mouth daily. ) 90 tablet 1   No current facility-administered medications on file prior to visit.    Allergies  Allergen Reactions  . Nsaids Diarrhea  . Aleve [Naproxen Sodium] Other (See Comments)    Headache   . Codeine Nausea Only and Other (See Comments)    GI upset  . Penicillins Nausea Only and Other (See Comments)    GI upset Has patient had a PCN reaction causing immediate rash, facial/tongue/throat swelling, SOB or lightheadedness with hypotension: No Has patient had a PCN reaction causing severe rash involving mucus membranes or skin necrosis: No Has patient had a PCN reaction that required hospitalization No Has patient had a PCN reaction occurring within the last 10 years: No If all of the above answers are "NO", then may proceed with Cephalosporin use.   . Sulfonamide Derivatives Hives    Family History  Problem Relation Age of Onset  . COPD Mother   . Heart disease Mother   . Lung disease Father        Asbestosis  . Heart attack Father   . Heart disease Father   . Cerebral aneurysm Brother   . Aneurysm Brother        Brain  . Epilepsy Son   . Arthritis Maternal Grandmother   . Heart disease Maternal Grandmother   . Asthma Maternal Grandfather   . Cancer Maternal Grandfather   . Arthritis Paternal Grandmother   . Heart disease Paternal Grandmother   . Stroke Paternal Grandmother   . Early death Paternal Grandfather   . Heart disease Paternal Grandfather     Social History   Socioeconomic History  . Marital status: Married    Spouse name: Not on file  . Number of children: Not on file  . Years of education: Not on file  . Highest education level: Not on file  Occupational History  . Not on  file  Tobacco Use  . Smoking status: Current Every Day Smoker    Packs/day: 1.50    Years: 46.00    Pack years: 69.00    Types: Cigarettes  . Smokeless tobacco: Never Used  Substance and Sexual Activity  . Alcohol use: No  . Drug use: No  . Sexual activity: Yes  Other Topics Concern  . Not on file  Social History Narrative   Right handed  Caffeine~ 2 cups per day    Lives at home with husband (strained relationship)   Primary caretaker for disabled brother who had aneurism   Daughter died 06/24/18    Social Determinants of Health   Financial Resource Strain:   . Difficulty of Paying Living Expenses:   Food Insecurity:   . Worried About Charity fundraiser in the Last Year:   . Arboriculturist in the Last Year:   Transportation Needs:   . Film/video editor (Medical):   Marland Kitchen Lack of Transportation (Non-Medical):   Physical Activity:   . Days of Exercise per Week:   . Minutes of Exercise per Session:   Stress:   . Feeling of Stress :   Social Connections:   . Frequency of Communication with Friends and Family:   . Frequency of Social Gatherings with Friends and Family:   . Attends Religious Services:   . Active Member of Clubs or Organizations:   . Attends Archivist Meetings:   Marland Kitchen Marital Status:     Review of Systems - See HPI.  All other ROS are negative.  Wt 244 lb (110.7 kg)   BMI 44.63 kg/m   Physical Exam  Recent Results (from the past 2160 hour(s))  Comp Met (CMET)     Status: Abnormal   Collection Time: 03/05/19 11:43 AM  Result Value Ref Range   Sodium 128 (L) 135 - 145 mEq/L   Potassium 3.8 3.5 - 5.1 mEq/L   Chloride 89 (L) 96 - 112 mEq/L   CO2 29 19 - 32 mEq/L   Glucose, Bld 723 (HH) 70 - 99 mg/dL   BUN 15 6 - 23 mg/dL   Creatinine, Ser 1.20 0.40 - 1.20 mg/dL   Total Bilirubin 0.5 0.2 - 1.2 mg/dL   Alkaline Phosphatase 116 39 - 117 U/L   AST 53 (H) 0 - 37 U/L   ALT 35 0 - 35 U/L   Total Protein 6.5 6.0 - 8.3 g/dL   Albumin 3.3  (L) 3.5 - 5.2 g/dL   GFR 45.36 (L) >60.00 mL/min   Calcium 8.2 (L) 8.4 - 10.5 mg/dL  CBG monitoring, ED     Status: Abnormal   Collection Time: 03/05/19  6:43 PM  Result Value Ref Range   Glucose-Capillary 560 (HH) 70 - 99 mg/dL   Comment 1 Notify RN   CBC     Status: Abnormal   Collection Time: 03/05/19  7:21 PM  Result Value Ref Range   WBC 7.9 4.0 - 10.5 K/uL   RBC 3.99 3.87 - 5.11 MIL/uL   Hemoglobin 11.0 (L) 12.0 - 15.0 g/dL   HCT 36.9 36.0 - 46.0 %   MCV 92.5 80.0 - 100.0 fL   MCH 27.6 26.0 - 34.0 pg   MCHC 29.8 (L) 30.0 - 36.0 g/dL   RDW 17.5 (H) 11.5 - 15.5 %   Platelets 197 150 - 400 K/uL   nRBC 0.0 0.0 - 0.2 %    Comment: Performed at [facility], [address], [place], [place] [address]  Basic metabolic panel     Status: Abnormal   Collection Time: 03/05/19  7:21 PM  Result Value Ref Range   Sodium 130 (L) 135 - 145 mmol/L   Potassium 3.5 3.5 - 5.1 mmol/L   Chloride 90 (L) 98 - 111 mmol/L   CO2 30 22 - 32 mmol/L   Glucose, Bld 537 (HH) 70 - 99 mg/dL  Comment: CRITICAL RESULT CALLED TO, READ BACK BY AND VERIFIED WITH: TURNER,C ON 03/05/19 AT 2030 BY LOY,C    BUN 14 8 - 23 mg/dL   Creatinine, Ser 1.32 (H) 0.44 - 1.00 mg/dL   Calcium 8.5 (L) 8.9 - 10.3 mg/dL   GFR calc non Af Amer 43 (L) >60 mL/min   GFR calc Af Amer 50 (L) >60 mL/min   Anion gap 10 5 - 15    Comment: Performed at [facility], [address], [place], [place] [address]  Urinalysis, Routine w reflex microscopic     Status: Abnormal   Collection Time: 03/05/19 10:35 PM  Result Value Ref Range   Color, Urine YELLOW YELLOW   APPearance CLEAR CLEAR   Specific Gravity, Urine 1.029 1.005 - 1.030   pH 6.0 5.0 - 8.0   Glucose, UA >=500 (A) NEGATIVE mg/dL   Hgb urine dipstick NEGATIVE NEGATIVE   Bilirubin Urine NEGATIVE NEGATIVE   Ketones, ur NEGATIVE NEGATIVE mg/dL   Protein, ur NEGATIVE NEGATIVE mg/dL   Nitrite NEGATIVE NEGATIVE   Leukocytes,Ua NEGATIVE NEGATIVE   RBC / HPF 11-20 0 - 5 RBC/hpf    WBC, UA 6-10 0 - 5 WBC/hpf   Bacteria, UA RARE (A) NONE SEEN   Squamous Epithelial / LPF 6-10 0 - 5   Mucus PRESENT    Budding Yeast PRESENT     Comment: Performed at [facility], [address], [place], [place] [address]  CBG monitoring, ED     Status: Abnormal   Collection Time: 03/05/19 10:43 PM  Result Value Ref Range   Glucose-Capillary 445 (H) 70 - 99 mg/dL  Urine Microalbumin w/creat. ratio     Status: None   Collection Time: 03/12/19  1:54 PM  Result Value Ref Range   Creatinine, Urine 91 20 - 275 mg/dL   Microalb, Ur 0.4 mg/dL    Comment: Reference Range Not established    Microalb Creat Ratio 4 <30 mcg/mg creat    Comment: . The ADA defines abnormalities in albumin excretion as follows: Marland Kitchen Category         Result (mcg/mg creatinine) . Normal                    <30 Microalbuminuria         30-299  Clinical albuminuria   > OR = 300 . The ADA recommends that at least two of three specimens collected within a 3-6 month period be abnormal before considering a patient to be within a diagnostic category.   CBC w/Diff     Status: Abnormal   Collection Time: 03/12/19  1:54 PM  Result Value Ref Range   WBC 9.2 3.8 - 10.8 Thousand/uL   RBC 4.20 3.80 - 5.10 Million/uL   Hemoglobin 11.7 11.7 - 15.5 g/dL   HCT 36.3 35.0 - 45.0 %   MCV 86.4 80.0 - 100.0 fL   MCH 27.9 27.0 - 33.0 pg   MCHC 32.2 32.0 - 36.0 g/dL   RDW 16.5 (H) 11.0 - 15.0 %   Platelets 237 140 - 400 Thousand/uL   MPV 11.0 7.5 - 12.5 fL   Neutro Abs 4,959 1,500 - 7,800 cells/uL   Lymphs Abs 3,257 850 - 3,900 cells/uL   Absolute Monocytes 672 200 - 950 cells/uL   Eosinophils Absolute 285 15 - 500 cells/uL   Basophils Absolute 28 0 - 200 cells/uL   Neutrophils Relative % 53.9 %   Total Lymphocyte 35.4 %   Monocytes  Relative 7.3 %   Eosinophils Relative 3.1 %   Basophils Relative 0.3 %  Comp Met (CMET)     Status: Abnormal   Collection Time: 03/12/19  1:54 PM  Result Value Ref Range   Glucose, Bld  107 (H) 65 - 99 mg/dL    Comment: .            Fasting reference interval . For someone without known diabetes, a glucose value between 100 and 125 mg/dL is consistent with prediabetes and should be confirmed with a follow-up test. .    BUN 12 7 - 25 mg/dL   Creat 1.19 (H) 0.50 - 0.99 mg/dL    Comment: For patients >33 years of age, the reference limit for Creatinine is approximately 13% higher for people identified as African-American. .    BUN/Creatinine Ratio 10 6 - 22 (calc)   Sodium 136 135 - 146 mmol/L   Potassium 3.8 3.5 - 5.3 mmol/L   Chloride 96 (L) 98 - 110 mmol/L   CO2 27 20 - 32 mmol/L   Calcium 8.8 8.6 - 10.4 mg/dL   Total Protein 6.8 6.1 - 8.1 g/dL   Albumin 3.7 3.6 - 5.1 g/dL   Globulin 3.1 1.9 - 3.7 g/dL (calc)   AG Ratio 1.2 1.0 - 2.5 (calc)   Total Bilirubin 0.5 0.2 - 1.2 mg/dL   Alkaline phosphatase (APISO) 99 37 - 153 U/L   AST 52 (H) 10 - 35 U/L   ALT 26 6 - 29 U/L  TSH     Status: Abnormal   Collection Time: 03/12/19  1:54 PM  Result Value Ref Range   TSH 4.89 (H) 0.40 - 4.50 mIU/L  POCT HgB A1C     Status: Abnormal   Collection Time: 03/22/19  2:44 PM  Result Value Ref Range   Hemoglobin A1C 9.3 (A) 4.0 - 5.6 %   HbA1c POC (<> result, manual entry)     HbA1c, POC (prediabetic range)     HbA1c, POC (controlled diabetic range)    POCT CBG (Fasting - Glucose)     Status: Abnormal   Collection Time: 03/22/19  2:44 PM  Result Value Ref Range   Glucose Fasting, POC 119 (A) 70 - 99 mg/dL  Comp Met (CMET)     Status: Abnormal   Collection Time: 03/22/19  2:54 PM  Result Value Ref Range   Sodium 135 135 - 145 mEq/L   Potassium 4.0 3.5 - 5.1 mEq/L   Chloride 95 (L) 96 - 112 mEq/L   CO2 29 19 - 32 mEq/L   Glucose, Bld 110 (H) 70 - 99 mg/dL   BUN 13 6 - 23 mg/dL   Creatinine, Ser 1.01 0.40 - 1.20 mg/dL   Total Bilirubin 0.5 0.2 - 1.2 mg/dL   Alkaline Phosphatase 97 39 - 117 U/L   AST 39 (H) 0 - 37 U/L   ALT 26 0 - 35 U/L   Total Protein 6.9 6.0 -  8.3 g/dL   Albumin 3.8 3.5 - 5.2 g/dL   GFR 55.33 (L) >60.00 mL/min   Calcium 9.8 8.4 - 10.5 mg/dL  CBG monitoring, ED     Status: Abnormal   Collection Time: 04/29/19  8:03 PM  Result Value Ref Range   Glucose-Capillary 153 (H) 70 - 99 mg/dL    Comment: Glucose reference range applies only to samples taken after fasting for at least 8 hours.  CBC     Status: Abnormal   Collection Time:  04/29/19  9:48 PM  Result Value Ref Range   WBC 7.5 4.0 - 10.5 K/uL   RBC 3.75 (L) 3.87 - 5.11 MIL/uL   Hemoglobin 10.1 (L) 12.0 - 15.0 g/dL   HCT 35.1 (L) 36.0 - 46.0 %   MCV 93.6 80.0 - 100.0 fL   MCH 26.9 26.0 - 34.0 pg   MCHC 28.8 (L) 30.0 - 36.0 g/dL   RDW 17.2 (H) 11.5 - 15.5 %   Platelets 178 150 - 400 K/uL   nRBC 0.0 0.0 - 0.2 %    Comment: Performed at [facility], [address], [place], [place] [address]  Basic metabolic panel     Status: Abnormal   Collection Time: 04/29/19  9:48 PM  Result Value Ref Range   Sodium 139 135 - 145 mmol/L   Potassium 4.1 3.5 - 5.1 mmol/L   Chloride 104 98 - 111 mmol/L   CO2 27 22 - 32 mmol/L   Glucose, Bld 152 (H) 70 - 99 mg/dL    Comment: Glucose reference range applies only to samples taken after fasting for at least 8 hours.   BUN 15 8 - 23 mg/dL   Creatinine, Ser 0.77 0.44 - 1.00 mg/dL   Calcium 8.6 (L) 8.9 - 10.3 mg/dL   GFR calc non Af Amer >60 >60 mL/min   GFR calc Af Amer >60 >60 mL/min   Anion gap 8 5 - 15    Comment: Performed at [facility], [address], [place], [place] [address]    Assessment/Plan: 1. Degenerative lumbar spinal stenosis 2. Chronic pain syndrome We will continue Cymbalta at 60 mg once daily.  We will decrease her pain regimen from Percocet to hydrocodone 10-325 mg up to 3 times daily.  Follow-up in 2 weeks for reassessment and to make further adjustments. - HYDROcodone-acetaminophen (NORCO) 10-325 MG tablet; Take 1 tablet by mouth every 8 (eight) hours as needed for up to 5 days.  Dispense: 90 tablet; Refill:  0  3. Peripheral edema Still present but much improved from previous examination. Some evidence of venous stasis dermatitis without ulceration.  We will increase furosemide to 40 mg daily over the next 3 days before resuming 20 mg daily.  We will have her work on decreasing salt intake as this seems to be the biggest trigger of swelling.  DASH diet reviewed.  Handout given.  Follow-up 2 weeks.  Sooner if needed.  This visit occurred during the SARS-CoV-2 public health emergency.  Safety protocols were in place, including screening questions prior to the visit, additional usage of staff PPE, and extensive cleaning of exam room while observing appropriate contact time as indicated for disinfecting solutions.     [name], PA-C

## 2019-06-10 ENCOUNTER — Other Ambulatory Visit: Payer: Self-pay | Admitting: Physician Assistant

## 2019-06-12 ENCOUNTER — Other Ambulatory Visit: Payer: Self-pay | Admitting: Physician Assistant

## 2019-06-14 NOTE — Telephone Encounter (Signed)
Hydroxyzine LFD - 05/31/19 #30 with no refills LOV - 05/31/19 NOV - 06/15/19

## 2019-06-15 ENCOUNTER — Ambulatory Visit: Payer: Medicare Other | Admitting: Physician Assistant

## 2019-06-15 ENCOUNTER — Other Ambulatory Visit: Payer: Self-pay

## 2019-06-15 ENCOUNTER — Encounter: Payer: Self-pay | Admitting: Physician Assistant

## 2019-06-15 DIAGNOSIS — R609 Edema, unspecified: Secondary | ICD-10-CM

## 2019-06-15 DIAGNOSIS — G894 Chronic pain syndrome: Secondary | ICD-10-CM

## 2019-06-15 NOTE — Progress Notes (Signed)
Virtual Visit via Video Note  I connected with [name] on 06/15/19 at  2:30 PM EDT by a video enabled telemedicine application and verified that I am speaking with the correct [name] using two identifiers.  Location: Patient: Home Provider: [facility]   I discussed the limitations of evaluation and management by telemedicine and the availability of in [name] appointments. The patient expressed understanding and agreed to proceed.  History of Present Illness: Patient presents via phone today as she cannot access video platform. Patient is following up regarding chronic pain and peripheral edema.  At last visit patent has requested a decrease in her pain regimen from Percocet to Hydrocodone-APAP. Is taking as directed and notes tolerating well. Notes feels better on this medication instead of the oxycodone. Pain is fairly controlled but she feels more alert and active. Is on a regimen of Gabapentin BID but has only been taking in the mornings. Notes worsening of pain in the afternoon.   In regards to the peripheral edema, patient endorses taking medications as directed. Tolerating well and notes significant improvement in her symptoms. Is elevating legs while resting and following a low-salt diet. Notes that stasis dermatitis is improved. Still having itching of her legs but notes that this seems to only begin in the afternoon/evening around 3 or later. Resolved by morning. The Hydroxyzine she has been given previously does help.    Observations/Objective: No labored breathing.  Speech is clear and coherent with logical content.  Patient is alert and oriented at baseline.   Assessment and Plan: 1. Chronic pain syndrome Feels better overall with new medication dosing. Continue the same. She is going to work on being consistent with her Gabapentin doses as I expect this will help considerably. In-office follow-up scheduled.   2. Peripheral edema Much improved per patient. Unable to  visualize via phone. Will plan on her continuing current regimen. The itching I am starting to believe may be atypical presentation of an RLS giving the timing of symptoms. Want her to first work on being consistent with her gabapentin doses to see what impact this may have. Can use hydroxyzine as directed if needed. If not improving further will reassess in office and may consider trial of requip.    Follow Up Instructions: I discussed the assessment and treatment plan with the patient. The patient was provided an opportunity to ask questions and all were answered. The patient agreed with the plan and demonstrated an understanding of the instructions.   The patient was advised to call back or seek an in-[name] evaluation if the symptoms worsen or if the condition fails to improve as anticipated.  I provided 15 minutes of non-face-to-face time during this encounter.   [name], PA-C

## 2019-06-15 NOTE — Progress Notes (Signed)
I have discussed the procedure for the virtual visit with the patient who has given consent to proceed with assessment and treatment.   Jordain Radin S Kyliah Deanda, CMA     

## 2019-06-15 NOTE — Patient Instructions (Signed)
Instructions sent to MyChart

## 2019-06-16 ENCOUNTER — Other Ambulatory Visit: Payer: Self-pay | Admitting: Physician Assistant

## 2019-06-17 ENCOUNTER — Other Ambulatory Visit: Payer: Self-pay | Admitting: Physician Assistant

## 2019-06-17 ENCOUNTER — Telehealth: Payer: Self-pay

## 2019-06-17 ENCOUNTER — Other Ambulatory Visit: Payer: Self-pay | Admitting: Emergency Medicine

## 2019-06-17 DIAGNOSIS — L299 Pruritus, unspecified: Secondary | ICD-10-CM

## 2019-06-17 MED ORDER — HYDROXYZINE HCL 10 MG PO TABS: 10.0000 mg | ORAL_TABLET | Freq: Three times a day (TID) | ORAL | 1 refills | Status: DC | PRN

## 2019-06-17 NOTE — Telephone Encounter (Signed)
Xanax last rx 05/20/19 #120 LOV: 06/15/19 Pain syndrome

## 2019-06-17 NOTE — Telephone Encounter (Signed)
Ok to start triamcinolone 0.1% cream once daily to the legs for 10-14 days.

## 2019-06-17 NOTE — Telephone Encounter (Signed)
Hydroxyzine rx was sent to [facility] drug. Please advise about a topical. In reviewing her chart there was not a rx sent in for her legs. Please advise

## 2019-06-17 NOTE — Telephone Encounter (Signed)
Patient states that she was supposed to have a cream and pills for itching sent in to [facility]. Patient states she is unsure of the names of the medications.

## 2019-06-18 MED ORDER — TRIAMCINOLONE ACETONIDE 0.1 % EX CREA: 1.0000 "application " | TOPICAL_CREAM | Freq: Every day | CUTANEOUS | 0 refills | Status: DC

## 2019-06-18 NOTE — Addendum Note (Signed)
Addended by: Leonidas Romberg on: 06/18/2019 09:02 AM   Modules accepted: Orders

## 2019-06-21 ENCOUNTER — Other Ambulatory Visit: Payer: Self-pay | Admitting: *Deleted

## 2019-06-21 MED ORDER — BUDESONIDE-FORMOTEROL FUMARATE 160-4.5 MCG/ACT IN AERO: 2.0000 | INHALATION_SPRAY | Freq: Two times a day (BID) | RESPIRATORY_TRACT | 0 refills | Status: DC

## 2019-06-21 MED ORDER — SPIRIVA RESPIMAT 2.5 MCG/ACT IN AERS: 2.0000 | INHALATION_SPRAY | Freq: Every day | RESPIRATORY_TRACT | 0 refills | Status: DC

## 2019-06-22 ENCOUNTER — Ambulatory Visit: Payer: Medicare Other | Admitting: Nutrition

## 2019-06-22 ENCOUNTER — Other Ambulatory Visit: Payer: Self-pay | Admitting: Physician Assistant

## 2019-06-22 MED ORDER — LEVOTHYROXINE SODIUM 75 MCG PO TABS: ORAL_TABLET | ORAL | 1 refills | Status: DC

## 2019-06-22 MED ORDER — METFORMIN HCL ER 500 MG PO TB24: 1000.0000 mg | ORAL_TABLET | Freq: Every day | ORAL | 1 refills | Status: DC

## 2019-06-23 ENCOUNTER — Telehealth: Payer: Self-pay | Admitting: Physician Assistant

## 2019-06-23 DIAGNOSIS — L299 Pruritus, unspecified: Secondary | ICD-10-CM

## 2019-06-23 NOTE — Telephone Encounter (Signed)
Patient states Einar Pheasant has prescribed her a medicine for itching.  States the medication is not working.  She would like to know if Einar Pheasant could send something else to her pharmacy?  Patient uses [facility].

## 2019-06-24 ENCOUNTER — Other Ambulatory Visit: Payer: Self-pay | Admitting: Emergency Medicine

## 2019-06-24 DIAGNOSIS — E785 Hyperlipidemia, unspecified: Secondary | ICD-10-CM

## 2019-06-24 NOTE — Telephone Encounter (Signed)
Spoke with patient about the itching. She states she is still having the itching even after taking Hydroxyzine and Triamcinolone.  She is agreeable with going to Dermatologist for evaluation. She wanted someone close her home

## 2019-06-25 NOTE — Telephone Encounter (Signed)
Referral placed for Dermatology in patient area

## 2019-06-25 NOTE — Telephone Encounter (Signed)
Ok to place referral to Dermatology in her area.

## 2019-07-01 ENCOUNTER — Other Ambulatory Visit: Payer: Self-pay | Admitting: Physician Assistant

## 2019-07-01 ENCOUNTER — Other Ambulatory Visit: Payer: Self-pay | Admitting: Emergency Medicine

## 2019-07-01 DIAGNOSIS — G894 Chronic pain syndrome: Secondary | ICD-10-CM

## 2019-07-01 DIAGNOSIS — E785 Hyperlipidemia, unspecified: Secondary | ICD-10-CM

## 2019-07-01 DIAGNOSIS — F419 Anxiety disorder, unspecified: Secondary | ICD-10-CM

## 2019-07-01 MED ORDER — HYDROCODONE-ACETAMINOPHEN 10-325 MG PO TABS: 1.0000 | ORAL_TABLET | Freq: Three times a day (TID) | ORAL | 0 refills | Status: DC | PRN

## 2019-07-01 MED ORDER — POTASSIUM CHLORIDE CRYS ER 10 MEQ PO TBCR: 10.0000 meq | EXTENDED_RELEASE_TABLET | Freq: Every day | ORAL | 2 refills | Status: DC

## 2019-07-01 MED ORDER — TRIAMTERENE-HCTZ 37.5-25 MG PO TABS: 1.0000 | ORAL_TABLET | Freq: Every day | ORAL | 2 refills | Status: DC

## 2019-07-01 MED ORDER — ATORVASTATIN CALCIUM 20 MG PO TABS: 20.0000 mg | ORAL_TABLET | Freq: Every day | ORAL | 2 refills | Status: DC

## 2019-07-01 MED ORDER — DULOXETINE HCL 60 MG PO CPEP: 60.0000 mg | ORAL_CAPSULE | Freq: Every day | ORAL | 2 refills | Status: DC

## 2019-07-01 NOTE — Telephone Encounter (Signed)
Pt called in asking for refill on the hydrocodone and the xanax, she wants that sent to the [facility] [place]. Pt can be reached at the home #

## 2019-07-01 NOTE — Telephone Encounter (Signed)
Indication for chronic opioid: Chronic pain syndrome Medication and dose: Hydrocodone-APAP 10/325 # pills per month: 90 Last UDS date: 08/31/2018 Opioid Treatment Agreement signed (Y/N): Yes, 08/21/17 Opioid Treatment Agreement last reviewed with patient:   [facility] reviewed this encounter (include red flags):     Xanax last rx 06/17/2019 #120

## 2019-07-05 ENCOUNTER — Ambulatory Visit: Payer: Medicare Other | Admitting: Gastroenterology

## 2019-07-06 ENCOUNTER — Other Ambulatory Visit: Payer: Self-pay | Admitting: General Practice

## 2019-07-06 DIAGNOSIS — E039 Hypothyroidism, unspecified: Secondary | ICD-10-CM

## 2019-07-06 DIAGNOSIS — E785 Hyperlipidemia, unspecified: Secondary | ICD-10-CM

## 2019-07-06 DIAGNOSIS — E119 Type 2 diabetes mellitus without complications: Secondary | ICD-10-CM

## 2019-07-07 ENCOUNTER — Emergency Department (HOSPITAL_COMMUNITY)
Admission: EM | Admit: 2019-07-07 | Discharge: 2019-07-07 | Disposition: A | Discharge status: Home / Self Care | Payer: Medicare Other | Attending: Emergency Medicine | Admitting: Emergency Medicine

## 2019-07-07 ENCOUNTER — Emergency Department (HOSPITAL_COMMUNITY): Payer: Medicare Other

## 2019-07-07 ENCOUNTER — Encounter (HOSPITAL_COMMUNITY): Payer: Self-pay | Admitting: Emergency Medicine

## 2019-07-07 ENCOUNTER — Telehealth: Payer: Self-pay | Admitting: Physician Assistant

## 2019-07-07 ENCOUNTER — Other Ambulatory Visit: Payer: Self-pay | Admitting: Gastroenterology

## 2019-07-07 ENCOUNTER — Other Ambulatory Visit: Payer: Self-pay | Admitting: Physician Assistant

## 2019-07-07 DIAGNOSIS — J449 Chronic obstructive pulmonary disease, unspecified: Secondary | ICD-10-CM | POA: Insufficient documentation

## 2019-07-07 DIAGNOSIS — R410 Disorientation, unspecified: Secondary | ICD-10-CM | POA: Diagnosis not present

## 2019-07-07 DIAGNOSIS — I10 Essential (primary) hypertension: Secondary | ICD-10-CM | POA: Insufficient documentation

## 2019-07-07 DIAGNOSIS — R443 Hallucinations, unspecified: Secondary | ICD-10-CM | POA: Insufficient documentation

## 2019-07-07 DIAGNOSIS — R519 Headache, unspecified: Secondary | ICD-10-CM | POA: Diagnosis not present

## 2019-07-07 DIAGNOSIS — R600 Localized edema: Secondary | ICD-10-CM

## 2019-07-07 DIAGNOSIS — N3000 Acute cystitis without hematuria: Secondary | ICD-10-CM | POA: Insufficient documentation

## 2019-07-07 DIAGNOSIS — Z882 Allergy status to sulfonamides status: Secondary | ICD-10-CM | POA: Insufficient documentation

## 2019-07-07 DIAGNOSIS — E119 Type 2 diabetes mellitus without complications: Secondary | ICD-10-CM | POA: Insufficient documentation

## 2019-07-07 DIAGNOSIS — Z880 Allergy status to penicillin: Secondary | ICD-10-CM | POA: Diagnosis not present

## 2019-07-07 DIAGNOSIS — F17210 Nicotine dependence, cigarettes, uncomplicated: Secondary | ICD-10-CM | POA: Diagnosis not present

## 2019-07-07 DIAGNOSIS — Z885 Allergy status to narcotic agent status: Secondary | ICD-10-CM | POA: Diagnosis not present

## 2019-07-07 DIAGNOSIS — R2240 Localized swelling, mass and lump, unspecified lower limb: Secondary | ICD-10-CM | POA: Insufficient documentation

## 2019-07-07 DIAGNOSIS — R4182 Altered mental status, unspecified: Secondary | ICD-10-CM | POA: Diagnosis present

## 2019-07-07 LAB — COMPREHENSIVE METABOLIC PANEL
ALT: 29 U/L (ref 0–44)
AST: 37 U/L (ref 15–41)
Albumin: 3.5 g/dL (ref 3.5–5.0)
Alkaline Phosphatase: 89 U/L (ref 38–126)
Anion gap: 8 (ref 5–15)
BUN: 17 mg/dL (ref 8–23)
CO2: 32 mmol/L (ref 22–32)
Calcium: 9 mg/dL (ref 8.9–10.3)
Chloride: 101 mmol/L (ref 98–111)
Creatinine, Ser: 1.01 mg/dL — ABNORMAL HIGH (ref 0.44–1.00)
GFR calc Af Amer: >60 mL/min (ref >60)
GFR calc non Af Amer: 59 mL/min — ABNORMAL LOW (ref >60)
Glucose, Bld: 99 mg/dL (ref 70–99)
Potassium: 4.4 mmol/L (ref 3.5–5.1)
Sodium: 141 mmol/L (ref 135–145)
Total Bilirubin: 0.6 mg/dL (ref 0.3–1.2)
Total Protein: 7.3 g/dL (ref 6.5–8.1)

## 2019-07-07 LAB — CBC
HCT: 37.4 % (ref 36.0–46.0)
Hemoglobin: 11.3 g/dL — ABNORMAL LOW (ref 12.0–15.0)
MCH: 27 pg (ref 26.0–34.0)
MCHC: 30.2 g/dL (ref 30.0–36.0)
MCV: 89.5 fL (ref 80.0–100.0)
Platelets: 174 10*3/uL (ref 150–400)
RBC: 4.18 MIL/uL (ref 3.87–5.11)
RDW: 19.4 % — ABNORMAL HIGH (ref 11.5–15.5)
WBC: 8.9 10*3/uL (ref 4.0–10.5)
nRBC: 0 % (ref 0.0–0.2)

## 2019-07-07 LAB — URINALYSIS, ROUTINE W REFLEX MICROSCOPIC
Bilirubin Urine: NEGATIVE
Glucose, UA: NEGATIVE mg/dL
Hgb urine dipstick: NEGATIVE
Ketones, ur: NEGATIVE mg/dL
Leukocytes,Ua: NEGATIVE
Nitrite: POSITIVE — AB
Protein, ur: NEGATIVE mg/dL
Specific Gravity, Urine: 1.014 (ref 1.005–1.030)
pH: 6 (ref 5.0–8.0)

## 2019-07-07 LAB — BLOOD GAS, VENOUS
Acid-Base Excess: 7.4 mmol/L — ABNORMAL HIGH (ref 0.0–2.0)
Bicarbonate: 29.7 mmol/L — ABNORMAL HIGH (ref 20.0–28.0)
FIO2: 21
O2 Saturation: 69.5 %
Patient temperature: 36.6
pCO2, Ven: 55.6 mmHg (ref 44.0–60.0)
pH, Ven: 7.384 (ref 7.250–7.430)
pO2, Ven: 39.4 mmHg (ref 32.0–45.0)

## 2019-07-07 LAB — AMMONIA: Ammonia: 33 umol/L (ref 9–35)

## 2019-07-07 NOTE — Telephone Encounter (Signed)
Pt's son/son-in-law called in stating that he is very concerned about [name]. He states that she is talking to people that are not there. Stating that a little girl is handing her the TV remote and that people are working on the roof. He states that no one is there besides her. He also states that she is calling to cancel appts that have been scheduled for her. She is not using her walker and is having to be assisted back to her room or having the walker brought to her.   I have advised [name] of Cody's recommendation to take her to the ED to be evaluated, He states that she will not go, so we advised him to call 9-1-1 and have EMS come out to evaluated her.    [name] can be reached at [phone] (he is not on DPR)

## 2019-07-07 NOTE — Discharge Instructions (Addendum)
See your doctor as soon as possible for reassessment and to review medications. Follow up urine culture results with your doctor on Friday.  Return for new concerns.

## 2019-07-07 NOTE — ED Notes (Signed)
Pt is aware we need urine sample.  

## 2019-07-07 NOTE — Telephone Encounter (Signed)
Spoke with pt and her husband, I advised them that [name] would like Pt to go to the ED to be evaluated.

## 2019-07-07 NOTE — ED Notes (Signed)
Family at bedside. 

## 2019-07-07 NOTE — ED Provider Notes (Signed)
[name] Provider Note   CSN: [id number] Arrival date & time: 07/07/19  1211     History Chief Complaint  Patient presents with  . Altered Mental Status    [name] is a 64 y.o. female.  Patient with COPD, depression, home O2 at night 2 L, uses a walker at baseline, hypothyroidism, diabetes presents with intermittent auditory and visual hallucinations and mild confusion for 2 to 3 months.  No stroke hx or unilateral sxs.  No new medicines except metformin.  Patient has had more frequent falls as of late. No seizures witnessed.  General weakness. Pt does take xanax and a number of medicines.         Past Medical History:  Diagnosis Date  . Allergic rhinitis   . Anemia   . Anxiety   . Chicken pox   . Chronic back pain   . COPD (chronic obstructive pulmonary disease) ([place])   . Depression   . Essential hypertension   . GERD (gastroesophageal reflux disease)   . Headache    migraines  . History of gastritis    EGD 2015  . History of home oxygen therapy    2 liters at hs last 6 months  . Hyperlipidemia   . Hypothyroidism   . Migraines   . Osteoarthritis    oa  . Scoliosis     Patient Active Problem List   Diagnosis Date Noted  . Diabetes mellitus, new onset ([place]) 04/12/2019  . Tremor 11/05/2018  . Chronic respiratory failure with hypoxia ([place]) 01/12/2018  . GERD with esophagitis 11/26/2017  . Dysphagia 08/20/2017  . Chronic pain syndrome 08/04/2017  . Comorbid sleep-related hypoventilation 08/04/2017  . OSA (obstructive sleep apnea) 08/04/2017  . Hepatic steatosis 08/04/2017  . Polyp of ascending colon   . Diverticulosis of colon without hemorrhage   . Gastritis and gastroduodenitis   . Hypothyroidism 07/22/2017  . Anxiety and depression 07/22/2017  . Iron deficiency anemia 07/22/2017  . Pilar cyst 07/22/2017  . Morbid obesity due to excess calories ([place]) 03/19/2017  . Anemia 03/19/2017  . COPD GOLD II still smoking   03/18/2017  . Degenerative lumbar spinal stenosis 02/17/2017  . S/P right hip revision 09/02/2016  . S/P hip replacement 09/02/2016  . Instability of prosthetic hip ([place]) 03/01/2016  . Dislocation of hip prosthesis ([place]) 02/17/2016  . Abnormal nuclear stress test: Intermediate Risk 10/31/2015  . Atypical angina ([place]) 10/31/2015  . Trigger finger, acquired 08/20/2012  . Cigarette smoker 12/12/2008  . Hyperlipidemia 11/17/2008  . HYPERTENSION, BENIGN 11/16/2008  . GEN OSTEOARTHROSIS INVOLVING MULTIPLE SITES 11/16/2008    Past Surgical History:  Procedure Laterality Date  . APPENDECTOMY     1985  . BIOPSY  07/24/2017   Procedure: BIOPSY;  Surgeon: Milus Banister, MD;  Location: [facility];  Service: Endoscopy;;  . CARDIAC CATHETERIZATION N/A 10/31/2015   Procedure: Left Heart Cath and Coronary Angiography;  Surgeon: Leonie Man, MD;  Location: [facility];  Service: Cardiovascular;  Laterality: N/A;  . CARPAL TUNNEL RELEASE Left   . CARPAL TUNNEL RELEASE Right   . CHOLECYSTECTOMY  late 1980's  . COLONOSCOPY WITH PROPOFOL N/A 07/24/2017   Procedure: COLONOSCOPY WITH PROPOFOL;  Surgeon: Milus Banister, MD;  Location: [facility];  Service: Endoscopy;  Laterality: N/A;  . ESOPHAGOGASTRODUODENOSCOPY N/A 07/24/2017   Procedure: ESOPHAGOGASTRODUODENOSCOPY (EGD);  Surgeon: Milus Banister, MD;  Location: [facility];  Service: Endoscopy;  Laterality: N/A;  . GALLBLADDER SURGERY  [place] REDUCTION Right 01/08/2016   Procedure: CLOSED MANIPULATION HIP;  Surgeon: Susa Day, MD;  Location: [facility];  Service: Orthopedics;  Laterality: Right;  . HIP CLOSED REDUCTION Right 01/19/2016   Procedure: ATTEMPTED CLOSED REDUCTION RIGHT HIP;  Surgeon: Wylene Simmer, MD;  Location: [facility];  Service: Orthopedics;  Laterality: Right;  . HIP CLOSED REDUCTION Right 01/20/2016   Procedure: CLOSED REDUCTION RIGHT TOTAL HIP;  Surgeon: Paralee Cancel, MD;  Location: [facility];  Service:  Orthopedics;  Laterality: Right;  . HIP CLOSED REDUCTION Right 02/17/2016   Procedure: CLOSED REDUCTION RIGHT TOTAL HIP;  Surgeon: Rod Can, MD;  Location: [facility];  Service: Orthopedics;  Laterality: Right;  . HIP CLOSED REDUCTION Right 02/28/2016   Procedure: CLOSED REDUCTION HIP;  Surgeon: Nicholes Stairs, MD;  Location: [facility];  Service: Orthopedics;  Laterality: Right;  . POLYPECTOMY  07/24/2017   Procedure: POLYPECTOMY;  Surgeon: Milus Banister, MD;  Location: [facility];  Service: Endoscopy;;  . TONSILLECTOMY    . TOTAL ABDOMINAL HYSTERECTOMY     1985, with 1 ovary removed and 2 nd ovary removed 2003  . TOTAL HIP ARTHROPLASTY Right    Original surgery 2006 with revision 2010  . TOTAL HIP REVISION Right 01/01/2016   Procedure: TOTAL HIP REVISION;  Surgeon: Paralee Cancel, MD;  Location: [facility];  Service: Orthopedics;  Laterality: Right;  . TOTAL HIP REVISION Right 03/02/2016   Procedure: TOTAL HIP REVISION;  Surgeon: Paralee Cancel, MD;  Location: [facility];  Service: Orthopedics;  Laterality: Right;  . TOTAL HIP REVISION Right 09/02/2016   Procedure: Right hip constrained liner- posterior;  Surgeon: Paralee Cancel, MD;  Location: [facility];  Service: Orthopedics;  Laterality: Right;  . ULNAR NERVE TRANSPOSITION Right      OB History   No obstetric history on file.     Family History  Problem Relation Age of Onset  . COPD Mother   . Heart disease Mother   . Lung disease Father        Asbestosis  . Heart attack Father   . Heart disease Father   . Cerebral aneurysm Brother   . Aneurysm Brother        Brain  . Epilepsy Son   . Arthritis Maternal Grandmother   . Heart disease Maternal Grandmother   . Asthma Maternal Grandfather   . Cancer Maternal Grandfather   . Arthritis Paternal Grandmother   . Heart disease Paternal Grandmother   . Stroke Paternal Grandmother   . Early death Paternal Grandfather   . Heart disease Paternal Grandfather     Social History   Tobacco  Use  . Smoking status: Current Every Day Smoker    Packs/day: 1.50    Years: 46.00    Pack years: 69.00    Types: Cigarettes  . Smokeless tobacco: Never Used  Substance Use Topics  . Alcohol use: No  . Drug use: No    Home Medications Prior to Admission medications   Medication Sig Start Date End Date Taking? Authorizing Provider  albuterol (PROAIR HFA) 108 (90 Base) MCG/ACT inhaler 2 puffs every 4 hours as needed only  if your can't catch your breath Patient taking differently: Inhale 2 puffs into the lungs every 4 (four) hours as needed for wheezing or shortness of breath. if your can't catch your breath 10/28/18  Yes Brunetta Jeans, PA-C  ALPRAZolam (XANAX) 1 MG tablet TAKE 1 TABLET BY MOUTH FOUR TIMES DAILY AS NEEDED FOR  ANXIETY Patient taking differently: Take 1 mg by mouth 4 (four) times daily as needed for anxiety.  06/17/19  Yes Brunetta Jeans, PA-C  atorvastatin (LIPITOR) 20 MG tablet Take 1 tablet (20 mg total) by mouth at bedtime. 07/01/19  Yes Brunetta Jeans, PA-C  budesonide-formoterol [name]) 160-4.5 MCG/ACT inhaler Inhale 2 puffs into the lungs 2 (two) times daily. 06/21/19  Yes Parrett, Tammy S, NP  DULoxetine (CYMBALTA) 60 MG capsule Take 1 capsule (60 mg total) by mouth daily. 07/01/19  Yes Brunetta Jeans, PA-C  fluticasone (FLONASE) 50 MCG/ACT nasal spray Place 2 sprays into both nostrils daily.   Yes [provider]  nystatin (MYCOSTATIN/NYSTOP) powder APPLY TO THE AFFECTED AREA(S) TWICE DAILY 06/17/19   Brunetta Jeans, PA-C  furosemide (LASIX) 20 MG tablet Take 1 tablet (20 mg total) by mouth daily as needed. 04/28/19   Brunetta Jeans, PA-C  gabapentin (NEURONTIN) 300 MG capsule Take 600 mg by mouth 2 (two) times daily.     [provider]  HYDROcodone-acetaminophen (NORCO) 10-325 MG tablet Take 1 tablet by mouth every 8 (eight) hours as needed. 07/01/19   Brunetta Jeans, PA-C  hydrOXYzine (ATARAX/VISTARIL) 10 MG tablet Take 1 tablet (10  mg total) by mouth 3 (three) times daily as needed. 06/17/19   Brunetta Jeans, PA-C  JANUVIA 25 MG tablet TAKE 1 TABLET BY MOUTH DAILY 05/21/19   Brunetta Jeans, PA-C  levothyroxine (SYNTHROID) 75 MCG tablet TAKE 1 TABLET BY MOUTH DAILY BEFORE BREAKFAST 06/22/19   Brunetta Jeans, PA-C  loratadine (CLARITIN) 10 MG tablet Take 10 mg by mouth daily.    [provider]  meloxicam (MOBIC) 15 MG tablet Take 15 mg by mouth daily. 10/26/18   [provider]  metFORMIN (GLUCOPHAGE XR) 500 MG 24 hr tablet Take 2 tablets (1,000 mg total) by mouth daily with breakfast. 06/22/19   Brunetta Jeans, PA-C  omeprazole (PRILOSEC) 40 MG capsule TAKE ONE CAPSULE BY MOUTH DAILY BEFORE BREAKFAST and TAKE ONE CAPSULE DAILY BEFORE DINNER 07/07/19   Milus Banister, MD  ondansetron (ZOFRAN) 4 MG tablet Take 1 tablet (4 mg total) by mouth every 8 (eight) hours as needed for nausea or vomiting. 03/10/19   Brunetta Jeans, PA-C  OXYGEN Inhale 2 L into the lungs at bedtime.     [provider]  potassium chloride (KLOR-CON) 10 MEQ tablet Take 1 tablet (10 mEq total) by mouth at bedtime. 07/01/19   Brunetta Jeans, PA-C  Tiotropium Bromide Monohydrate (SPIRIVA RESPIMAT) 2.5 MCG/ACT AERS Inhale 2 puffs into the lungs daily. 06/21/19   Parrett, Fonnie Mu, NP  triamcinolone cream (KENALOG) 0.1 % Apply 1 application topically daily. 06/18/19   Brunetta Jeans, PA-C  triamterene-hydrochlorothiazide (MAXZIDE-25) 37.5-25 MG tablet Take 1 tablet by mouth daily. 07/01/19   Brunetta Jeans, PA-C    Allergies    Nsaids, Aleve [naproxen sodium], Codeine, Penicillins, and Sulfonamide derivatives  Review of Systems   Review of Systems  Constitutional: Positive for fatigue. Negative for chills and fever.  HENT: Negative for congestion.   Eyes: Negative for visual disturbance.  Respiratory: Negative for shortness of breath.   Cardiovascular: Negative for chest pain.  Gastrointestinal: Negative for  abdominal pain and vomiting.  Genitourinary: Negative for dysuria and flank pain.  Musculoskeletal: Negative for back pain, neck pain and neck stiffness.  Skin: Negative for rash.  Neurological: Positive for weakness (general). Negative for light-headedness and headaches.  Psychiatric/Behavioral: Positive for  confusion and hallucinations.    Physical Exam Updated Vital Signs BP 129/74 (BP Location: Right Wrist)   Pulse 84   Temp 98.2 F (36.8 C) (Oral)   Resp 18   Ht 5\' 2"  (1.575 m)   Wt 113.4 kg   SpO2 91%   BMI 45.73 kg/m   Physical Exam Vitals and nursing note reviewed.  Constitutional:      Appearance: She is well-developed.  HENT:     Head: Normocephalic and atraumatic.  Eyes:     General:        Right eye: No discharge.        Left eye: No discharge.     Conjunctiva/sclera: Conjunctivae normal.  Neck:     Trachea: No tracheal deviation.  Cardiovascular:     Rate and Rhythm: Normal rate and regular rhythm.  Pulmonary:     Effort: Pulmonary effort is normal.     Breath sounds: Normal breath sounds.  Abdominal:     General: There is no distension.     Palpations: Abdomen is soft.     Tenderness: There is no abdominal tenderness. There is no guarding.  Musculoskeletal:        General: Swelling (LEs bilateral mild pitting) and tenderness present.     Cervical back: Normal range of motion and neck supple. No rigidity.  Skin:    General: Skin is warm.     Capillary Refill: Capillary refill takes less than 2 seconds.     Findings: Rash (excoriations LE bilateral anterior) present.  Neurological:     General: No focal deficit present.     Mental Status: She is alert and oriented to person, place, and time.     GCS: GCS eye subscore is 4. GCS verbal subscore is 5. GCS motor subscore is 6.     Comments: General weakness, can walk with mild assistance, equal strength bilateral UE and LE, finger nose intact, perrl, eomfi Visual fields intact  Psychiatric:         Attention and Perception: She perceives visual hallucinations.        Mood and Affect: Affect is blunt.        Speech: Speech normal.        Thought Content: Thought content is not paranoid.        Judgment: Judgment is not impulsive.     ED Results / Procedures / Treatments   Labs (all labs ordered are listed, but only abnormal results are displayed) Labs Reviewed  COMPREHENSIVE METABOLIC PANEL - Abnormal; Notable for the following components:      Result Value   Creatinine, Ser 1.01 (*)    GFR calc non Af Amer 59 (*)    All other components within normal limits  CBC - Abnormal; Notable for the following components:   Hemoglobin 11.3 (*)    RDW 19.4 (*)    All other components within normal limits  URINALYSIS, ROUTINE W REFLEX MICROSCOPIC - Abnormal; Notable for the following components:   Nitrite POSITIVE (*)    Bacteria, UA MANY (*)    All other components within normal limits  BLOOD GAS, VENOUS - Abnormal; Notable for the following components:   Bicarbonate 29.7 (*)    Acid-Base Excess 7.4 (*)    All other components within normal limits  URINE CULTURE  AMMONIA  CBG MONITORING, ED    EKG None  Radiology CT Head Wo Contrast  Result Date: 07/07/2019 CLINICAL DATA:  Dizziness with hallucinations. EXAM: CT HEAD WITHOUT  CONTRAST TECHNIQUE: Contiguous axial images were obtained from the base of the skull through the vertex without intravenous contrast. COMPARISON:  None. FINDINGS: Brain: There is mild cerebral atrophy with widening of the extra-axial spaces and ventricular dilatation. There are areas of decreased attenuation within the white matter tracts of the supratentorial brain, consistent with microvascular disease changes. Small chronic left basal ganglia lacunar infarct is seen. Vascular: No hyperdense vessel or unexpected calcification. Skull: Normal. Negative for fracture or focal lesion. Sinuses/Orbits: No acute finding. Other: None. IMPRESSION: 1. Generalized cerebral  atrophy. 2. No acute intracranial abnormality. Electronically Signed   By: Virgina Norfolk M.D.   On: 07/07/2019 16:05    Procedures Procedures (including critical care time)  Medications Ordered in ED Medications - No data to display  ED Course  I have reviewed the triage vital signs and the nursing notes.  Pertinent labs & imaging results that were available during my care of the patient were reviewed by me and considered in my medical decision making (see chart for details).    MDM Rules/Calculators/A&P                      Patient with mild confusion/ visual hallucinations for 3 months, broad differential including medication SE, metabolic, intracranial, psychiatric, infectious (ie. UA), hypercarbic, other. Plan for blood work, UA, CT head and medications reviewed. Discussed decreasing benzodiazepine dose to see if it helps.  CT head reviewed. Creatinine 1, WBC normal, Hb 11.3,   Urinalysis, urinalysis reviewed consistent with mild urine infection with positive nitrite and bacteria culture sent for outpatient follow-up.  CT scan results no acute abnormality reviewed.  Patient stable for outpatient follow-up.   Final Clinical Impression(s) / ED Diagnoses Final diagnoses:  Hallucinations  Confusion  Lower leg edema  Acute cystitis without hematuria    Rx / DC Orders ED Discharge Orders    None       Elnora Morrison, MD 07/07/19 [phone]

## 2019-07-07 NOTE — ED Triage Notes (Signed)
Patient is having dizziness, falls, auditory and visual hallucinations, for several months, per husband, seen by PCP 2 weeks ago and told to come here for evaluation.

## 2019-07-07 NOTE — Telephone Encounter (Signed)
Thank you [name]. Will reach back out to them in just a bit to get an update. We cannot share her health information with him since he is not on the DPR but we can give triage advice through him and to her directly.

## 2019-07-09 ENCOUNTER — Telehealth: Payer: Self-pay | Admitting: Physician Assistant

## 2019-07-09 ENCOUNTER — Other Ambulatory Visit: Payer: Self-pay

## 2019-07-09 ENCOUNTER — Ambulatory Visit (INDEPENDENT_AMBULATORY_CARE_PROVIDER_SITE_OTHER): Payer: Medicare Other | Admitting: Physician Assistant

## 2019-07-09 ENCOUNTER — Ambulatory Visit: Payer: Medicare Other | Admitting: Adult Health

## 2019-07-09 ENCOUNTER — Encounter: Payer: Self-pay | Admitting: Physician Assistant

## 2019-07-09 ENCOUNTER — Telehealth: Payer: Self-pay

## 2019-07-09 VITALS — BP 130/82 | HR 90 | Temp 98.0°F | Ht 62.0 in | Wt 241.0 lb

## 2019-07-09 DIAGNOSIS — N3090 Cystitis, unspecified without hematuria: Secondary | ICD-10-CM | POA: Diagnosis not present

## 2019-07-09 DIAGNOSIS — J449 Chronic obstructive pulmonary disease, unspecified: Secondary | ICD-10-CM | POA: Diagnosis not present

## 2019-07-09 DIAGNOSIS — R413 Other amnesia: Secondary | ICD-10-CM | POA: Diagnosis not present

## 2019-07-09 DIAGNOSIS — R441 Visual hallucinations: Secondary | ICD-10-CM

## 2019-07-09 DIAGNOSIS — L299 Pruritus, unspecified: Secondary | ICD-10-CM | POA: Diagnosis not present

## 2019-07-09 LAB — BASIC METABOLIC PANEL
BUN: 14 mg/dL (ref 6–23)
CO2: 33 mEq/L — ABNORMAL HIGH (ref 19–32)
Calcium: 8.7 mg/dL (ref 8.4–10.5)
Chloride: 102 mEq/L (ref 96–112)
Creatinine, Ser: 0.88 mg/dL (ref 0.40–1.20)
GFR: 64.81 mL/min (ref >60.00)
Glucose, Bld: 116 mg/dL — ABNORMAL HIGH (ref 70–99)
Potassium: 4 mEq/L (ref 3.5–5.1)
Sodium: 139 mEq/L (ref 135–145)

## 2019-07-09 MED ORDER — CEPHALEXIN 500 MG PO CAPS: 500.0000 mg | ORAL_CAPSULE | Freq: Two times a day (BID) | ORAL | 0 refills | Status: AC

## 2019-07-09 MED ORDER — DULOXETINE HCL 30 MG PO CPEP
30.0000 mg | ORAL_CAPSULE | Freq: Every day | ORAL | 1 refills | Status: DC
Start: 2019-07-09 — End: 2019-07-27

## 2019-07-09 MED ORDER — METFORMIN HCL ER (MOD) 500 MG PO TB24: 1000.0000 mg | ORAL_TABLET | Freq: Every day | ORAL | 1 refills | Status: DC

## 2019-07-09 NOTE — Telephone Encounter (Signed)
Can we call pt to discuss the medications for her blood sugar. She states that she is not sure if Einar Pheasant was going to change the medication.

## 2019-07-09 NOTE — Progress Notes (Signed)
Patient presents to [facility] today for ER follow-up. Patient presented to the ER on 07/07/19 for hallucinations and AMS that had been ongoing for several weeks. ER workup included assessment of vitals (within normal limits), examination revealing generalized weakness with equal strength of upper and lower extremities, labs (UA positive for nitrites, leukocytes and bacteria, hemoglobin 11.3, creatinine 1.01) and CT head (generalized cerebral atrophy without acute intracranial abnormality).  EDP felt that hallucinations could potentially be related to UTI, CO2 levels, or psychiatric cause.  Patient was discharged home same day without any medication for her UTI.  Since being home, patient endorses doing well overall.  Noting some urinary urgency, frequency and dysuria.  Has been trying to keep well-hydrated.  Does endorse continued visual hallucinations.  States she typically will see a young girl in the home who is working on fixing something in the wall.  States no one else sees her.  The ground does not communicate directly with her nor does she come in her to give anything.  Will sometimes have auditory hallucination as well.  Does note change in memory having a harder time remembering people and think she was supposed to do.  Denies any headache, nausea or vomiting.  Denies vision change.  Still taking all her chronic medications as directed "as far as she knows".  On further questioning regarding the statement she says that her husband has been giving her her medicine so she is not sure what she is taking presently.  Previously was having some visual hallucinations thought to be related to Cymbalta dosage.  This was decreased with resolution of symptoms.  Past Medical History:  Diagnosis Date   Allergic rhinitis    Anemia    Anxiety    Chicken pox    Chronic back pain    COPD (chronic obstructive pulmonary disease) (HCC)    Depression    Essential hypertension    GERD (gastroesophageal  reflux disease)    Headache    migraines   History of gastritis    EGD 2015   History of home oxygen therapy    2 liters at hs last 6 months   Hyperlipidemia    Hypothyroidism    Migraines    Osteoarthritis    oa   Scoliosis     Current Outpatient Medications on File Prior to Visit  Medication Sig Dispense Refill   albuterol (PROAIR HFA) 108 (90 Base) MCG/ACT inhaler 2 puffs every 4 hours as needed only  if your can't catch your breath (Patient taking differently: Inhale 2 puffs into the lungs every 4 (four) hours as needed for wheezing or shortness of breath. if your can't catch your breath) 6.7 g 0   ALPRAZolam (XANAX) 1 MG tablet TAKE 1 TABLET BY MOUTH FOUR TIMES DAILY AS NEEDED FOR ANXIETY (Patient taking differently: Take 1 mg by mouth 4 (four) times daily as needed for anxiety. ) 120 tablet 0   atorvastatin (LIPITOR) 20 MG tablet Take 1 tablet (20 mg total) by mouth at bedtime. 90 tablet 2   budesonide-formoterol (SYMBICORT) 160-4.5 MCG/ACT inhaler Inhale 2 puffs into the lungs 2 (two) times daily. 3 Inhaler 0   DULoxetine (CYMBALTA) 60 MG capsule Take 1 capsule (60 mg total) by mouth daily. 90 capsule 2   fluticasone (FLONASE) 50 MCG/ACT nasal spray Place 2 sprays into both nostrils daily.     furosemide (LASIX) 20 MG tablet Take 1 tablet (20 mg total) by mouth daily as needed. 30 tablet 0  gabapentin (NEURONTIN) 300 MG capsule Take 600 mg by mouth 2 (two) times daily.      HYDROcodone-acetaminophen (NORCO) 10-325 MG tablet Take 1 tablet by mouth every 8 (eight) hours as needed. 90 tablet 0   hydrOXYzine (ATARAX/VISTARIL) 10 MG tablet Take 1 tablet (10 mg total) by mouth 3 (three) times daily as needed. 30 tablet 1   JANUVIA 25 MG tablet TAKE 1 TABLET BY MOUTH DAILY 30 tablet 1   levothyroxine (SYNTHROID) 75 MCG tablet TAKE 1 TABLET BY MOUTH DAILY BEFORE BREAKFAST 90 tablet 1   loratadine (CLARITIN) 10 MG tablet Take 10 mg by mouth daily.     meloxicam  (MOBIC) 15 MG tablet Take 15 mg by mouth daily.     metFORMIN (GLUCOPHAGE XR) 500 MG 24 hr tablet Take 2 tablets (1,000 mg total) by mouth daily with breakfast. 180 tablet 1   nystatin (MYCOSTATIN/NYSTOP) powder APPLY TO THE AFFECTED AREA(S) TWICE DAILY 15 g 0   omeprazole (PRILOSEC) 40 MG capsule TAKE ONE CAPSULE BY MOUTH DAILY BEFORE BREAKFAST and TAKE ONE CAPSULE DAILY BEFORE DINNER 60 capsule 2   ondansetron (ZOFRAN) 4 MG tablet Take 1 tablet (4 mg total) by mouth every 8 (eight) hours as needed for nausea or vomiting. 10 tablet 0   OXYGEN Inhale 2 L into the lungs at bedtime.      potassium chloride (KLOR-CON) 10 MEQ tablet Take 1 tablet (10 mEq total) by mouth at bedtime. 90 tablet 2   Tiotropium Bromide Monohydrate (SPIRIVA RESPIMAT) 2.5 MCG/ACT AERS Inhale 2 puffs into the lungs daily. 12 g 0   triamcinolone cream (KENALOG) 0.1 % Apply 1 application topically daily. 30 g 0   triamterene-hydrochlorothiazide (MAXZIDE-25) 37.5-25 MG tablet Take 1 tablet by mouth daily. 90 tablet 2   No current facility-administered medications on file prior to visit.    Allergies  Allergen Reactions   Nsaids Diarrhea   Aleve [Naproxen Sodium] Other (See Comments)    Headache    Codeine Nausea Only and Other (See Comments)    GI upset   Penicillins Nausea Only and Other (See Comments)    GI upset    Sulfonamide Derivatives Hives    Family History  Problem Relation Age of Onset   COPD Mother    Heart disease Mother    Lung disease Father        Asbestosis   Heart attack Father    Heart disease Father    Cerebral aneurysm Brother    Aneurysm Brother        Brain   Epilepsy Son    Arthritis Maternal Grandmother    Heart disease Maternal Grandmother    Asthma Maternal Grandfather    Cancer Maternal Grandfather    Arthritis Paternal Grandmother    Heart disease Paternal Grandmother    Stroke Paternal Grandmother    Early death Paternal Grandfather    Heart  disease Paternal Grandfather     Social History   Socioeconomic History   Marital status: Married    Spouse name: Not on file   Number of children: Not on file   Years of education: Not on file   Highest education level: Not on file  Occupational History   Not on file  Tobacco Use   Smoking status: Current Every Day Smoker    Packs/day: 1.50    Years: 46.00    Pack years: 69.00    Types: Cigarettes   Smokeless tobacco: Never Used  Scientific laboratory technician  Use: Never used  Substance and Sexual Activity   Alcohol use: No   Drug use: No   Sexual activity: Yes  Other Topics Concern   Not on file  Social History Narrative   Right handed    Caffeine~ 2 cups per day    Lives at home with husband (strained relationship)   Primary caretaker for disabled brother who had aneurism   Daughter died 08-Jun-2018    Social Determinants of Health   Financial Resource Strain:    Difficulty of Paying Living Expenses:   Food Insecurity:    Worried About Charity fundraiser in the Last Year:    Arboriculturist in the Last Year:   Transportation Needs:    Film/video editor (Medical):    Lack of Transportation (Non-Medical):   Physical Activity:    Days of Exercise per Week:    Minutes of Exercise per Session:   Stress:    Feeling of Stress :   Social Connections:    Frequency of Communication with Friends and Family:    Frequency of Social Gatherings with Friends and Family:    Attends Religious Services:    Active Member of Clubs or Organizations:    Attends Archivist Meetings:    Marital Status:    Review of Systems - See HPI.  All other ROS are negative.  BP 130/82    Pulse 90    Temp 98 F (36.7 C)    Ht 5\' 2"  (1.575 m)    Wt 241 lb (109.3 kg)    SpO2 96%    BMI 44.08 kg/m   Physical Exam Vitals reviewed.  Constitutional:      Appearance: Normal appearance.  HENT:     Head: Normocephalic and atraumatic.     Nose: Nose normal.   Eyes:     Conjunctiva/sclera: Conjunctivae normal.  Cardiovascular:     Rate and Rhythm: Normal rate and regular rhythm.     Heart sounds: Normal heart sounds.  Pulmonary:     Effort: Pulmonary effort is normal.     Breath sounds: Normal breath sounds.  Abdominal:     Tenderness: There is no right CVA tenderness or left CVA tenderness.  Musculoskeletal:     Cervical back: Neck supple.  Neurological:     General: No focal deficit present.     Mental Status: She is alert and oriented to person, place, and time.     Cranial Nerves: No cranial nerve deficit.     Sensory: No sensory deficit.     Coordination: Coordination normal.  Psychiatric:        Behavior: Behavior normal.     Recent Results (from the past 2160 hour(s))  CBG monitoring, ED     Status: Abnormal   Collection Time: 04/29/19  8:03 PM  Result Value Ref Range   Glucose-Capillary 153 (H) 70 - 99 mg/dL    Comment: Glucose reference range applies only to samples taken after fasting for at least 8 hours.  CBC     Status: Abnormal   Collection Time: 04/29/19  9:48 PM  Result Value Ref Range   WBC 7.5 4.0 - 10.5 K/uL   RBC 3.75 (L) 3.87 - 5.11 MIL/uL   Hemoglobin 10.1 (L) 12.0 - 15.0 g/dL   HCT 35.1 (L) 36 - 46 %   MCV 93.6 80.0 - 100.0 fL   MCH 26.9 26.0 - 34.0 pg   MCHC 28.8 (L) 30.0 - 36.0  g/dL   RDW 17.2 (H) 11.5 - 15.5 %   Platelets 178 150 - 400 K/uL   nRBC 0.0 0.0 - 0.2 %    Comment: Performed at [facility], [address] [place], [place] [address]  Basic metabolic panel     Status: Abnormal   Collection Time: 04/29/19  9:48 PM  Result Value Ref Range   Sodium 139 135 - 145 mmol/L   Potassium 4.1 3.5 - 5.1 mmol/L   Chloride 104 98 - 111 mmol/L   CO2 27 22 - 32 mmol/L   Glucose, Bld 152 (H) 70 - 99 mg/dL    Comment: Glucose reference range applies only to samples taken after fasting for at least 8 hours.   BUN 15 8 - 23 mg/dL   Creatinine, Ser 0.77 0.44 - 1.00 mg/dL   Calcium 8.6 (L) 8.9 - 10.3  mg/dL   GFR calc non Af Amer >60 >60 mL/min   GFR calc Af Amer >60 >60 mL/min   Anion gap 8 5 - 15    Comment: Performed at [facility], [address], [place], [place] [address]  Comprehensive metabolic panel     Status: Abnormal   Collection Time: 07/07/19  2:04 PM  Result Value Ref Range   Sodium 141 135 - 145 mmol/L   Potassium 4.4 3.5 - 5.1 mmol/L   Chloride 101 98 - 111 mmol/L   CO2 32 22 - 32 mmol/L   Glucose, Bld 99 70 - 99 mg/dL    Comment: Glucose reference range applies only to samples taken after fasting for at least 8 hours.   BUN 17 8 - 23 mg/dL   Creatinine, Ser 1.01 (H) 0.44 - 1.00 mg/dL   Calcium 9.0 8.9 - 10.3 mg/dL   Total Protein 7.3 6.5 - 8.1 g/dL   Albumin 3.5 3.5 - 5.0 g/dL   AST 37 15 - 41 U/L   ALT 29 0 - 44 U/L   Alkaline Phosphatase 89 38 - 126 U/L   Total Bilirubin 0.6 0.3 - 1.2 mg/dL   GFR calc non Af Amer 59 (L) >60 mL/min   GFR calc Af Amer >60 >60 mL/min   Anion gap 8 5 - 15    Comment: Performed at [facility], [address] [place], [place] [address]  CBC     Status: Abnormal   Collection Time: 07/07/19  2:04 PM  Result Value Ref Range   WBC 8.9 4.0 - 10.5 K/uL   RBC 4.18 3.87 - 5.11 MIL/uL   Hemoglobin 11.3 (L) 12.0 - 15.0 g/dL   HCT 37.4 36 - 46 %   MCV 89.5 80.0 - 100.0 fL   MCH 27.0 26.0 - 34.0 pg   MCHC 30.2 30.0 - 36.0 g/dL   RDW 19.4 (H) 11.5 - 15.5 %   Platelets 174 150 - 400 K/uL   nRBC 0.0 0.0 - 0.2 %    Comment: Performed at [facility], [address], [place], [place] [address]  Urinalysis, Routine w reflex microscopic     Status: Abnormal   Collection Time: 07/07/19  3:04 PM  Result Value Ref Range   Color, Urine YELLOW YELLOW   APPearance CLEAR CLEAR   Specific Gravity, Urine 1.014 1.005 - 1.030   pH 6.0 5.0 - 8.0   Glucose, UA NEGATIVE NEGATIVE mg/dL   Hgb urine dipstick NEGATIVE NEGATIVE   Bilirubin Urine NEGATIVE NEGATIVE   Ketones, ur NEGATIVE NEGATIVE mg/dL   Protein, ur NEGATIVE NEGATIVE  mg/dL   Nitrite  POSITIVE (A) NEGATIVE   Leukocytes,Ua NEGATIVE NEGATIVE   WBC, UA 0-5 0 - 5 WBC/hpf   Bacteria, UA MANY (A) NONE SEEN   Squamous Epithelial / LPF 0-5 0 - 5    Comment: Performed at [facility], [address] [place], [place] [address]  Urine culture     Status: Abnormal (Preliminary result)   Collection Time: 07/07/19  3:05 PM   Specimen: Urine, Random  Result Value Ref Range   Specimen Description      URINE, RANDOM Performed at [facility], [address], [place], [place] [address]    Special Requests      NONE Performed at [facility], [address], [place], [place] [address]    Culture >=100,000 COLONIES/mL GRAM NEGATIVE RODS (A)    Report Status PENDING   Ammonia     Status: None   Collection Time: 07/07/19  3:17 PM  Result Value Ref Range   Ammonia 33 9 - 35 umol/L    Comment: Performed at [facility], [address] [place], [place] [address]  Blood gas, venous (at [facility] and [facility], not at [facility])     Status: Abnormal   Collection Time: 07/07/19  3:17 PM  Result Value Ref Range   FIO2 21.00    pH, Ven 7.384 7.25 - 7.43   pCO2, Ven 55.6 44 - 60 mmHg   pO2, Ven 39.4 32 - 45 mmHg   Bicarbonate 29.7 (H) 20.0 - 28.0 mmol/L   Acid-Base Excess 7.4 (H) 0.0 - 2.0 mmol/L   O2 Saturation 69.5 %   Patient temperature 36.6     Comment: Performed at [facility], [address], [place], [place] [address]    Assessment/Plan: 1. Cystitis Unclear whether cystitis is the cause of visual hallucinations and memory changes but it is certainly a contributory factor.  This was diagnosed in ER but no treatment was sent in.  Urine culture reviewed revealing E. coli UTI sensitive to cephalosporins.  Will Rx Keflex 500 mg twice daily x7 days.  We will also recheck creatinine level today.  Strict ER precautions reviewed with patient.  Close follow-up scheduled. - cephALEXin (KEFLEX) 500 MG capsule; Take 1 capsule (500 mg total) by mouth 2 (two) times daily for 7 days.  Dispense: 14 capsule; Refill:  0 - Basic metabolic panel  2. Memory changes This needs further assessment.  This is partly related to current infection with cystitis but have concerns that this may be related to overmedication.  We will send PHN to the home to assess her medications to make sure she is taking the appropriate dosage.  Education to be given to her husband as well who has been managing this. Will also place referral to Neurology. - AMB Referral to [facility] - Basic metabolic panel  3. Visual hallucinations We will have her decrease Cymbalta to 30 mg daily. Will also decrease her bzd to TID dosing for now in case is contributing. Repeat BMP today. Close follow-up scheduled. If no improvement will need Psychiatry on board.  - Basic metabolic panel  4. Chronic pruritus Feel this is psychogenic in nature.  Could be related to medication doses as well since there is question about what all her husband has been giving her (see above). continue hydroxyzine as needed for now.  Supportive measures and OTC medications reviewed.  Hoping decreasing her Cymbalta will help with both visual hallucinations and itching.  5. COPD GOLD II still smoking  -  AMB Referral to [facility]  Follow-up 1 week.     [name], PA-C

## 2019-07-09 NOTE — Telephone Encounter (Signed)
Ok to switch to Metformin XR at same dose.

## 2019-07-09 NOTE — Telephone Encounter (Signed)
Please call patient to get more information -- may need to reach out to the pharmacy.

## 2019-07-09 NOTE — Telephone Encounter (Signed)
Patient called and said that the pharmacy called questioning the Keflex rx.  Please call and advise.

## 2019-07-09 NOTE — Telephone Encounter (Signed)
Medication switched to Metformin ER sent to [facility]

## 2019-07-09 NOTE — Telephone Encounter (Signed)
Called [facility] spoke with Levada Dy the medication Cephalexin 500mg  was on the way being delivered to the patients home.   Nothing further is needed

## 2019-07-09 NOTE — Telephone Encounter (Signed)
Received message from mail order pharmacy about the Glucophage and switching to Metformin due to manufacturer changes.  This has been taken care of.

## 2019-07-09 NOTE — Patient Instructions (Signed)
Please go to the lab today for blood work.  I will call you with your results. We will alter treatment regimen(s) if indicated by your results.   Please stop the current dose of Cymbalta.  I want you to start the 30 mg dose over the next week. We will follow-up in 7 days and make further adjustments.  Also want you to decrease the Xanax to twice daily from three times daily. I am concerned medicines are now causing some of your symptoms -- itching, memory change, hallucinations.  I am getting you set up with neurology for memory changes as well. I am having an Therapist, sports from [employer] to come out and go through your medications with you to make sure you are only taking what you are prescribed.    Your symptoms are consistent with a bladder infection, also called acute cystitis. Please take your antibiotic (Keflex) as directed until all pills are gone.  Stay very well hydrated.  Consider a daily probiotic (Align, Culturelle, or Activia) to help prevent stomach upset caused by the antibiotic.  Taking a probiotic daily may also help prevent recurrent UTIs.  Also consider taking AZO (Phenazopyridine) tablets to help decrease pain with urination.  I will call you with your urine testing results.  We will change antibiotics if indicated.  Call or return to clinic if symptoms are not resolved by completion of antibiotic.   Urinary Tract Infection A urinary tract infection (UTI) can occur any place along the urinary tract. The tract includes the kidneys, ureters, bladder, and urethra. A type of germ called bacteria often causes a UTI. UTIs are often helped with antibiotic medicine.  HOME CARE   If given, take antibiotics as told by your doctor. Finish them even if you start to feel better.  Drink enough fluids to keep your pee (urine) clear or pale yellow.  Avoid tea, drinks with caffeine, and bubbly (carbonated) drinks.  Pee often. Avoid holding your pee in for a long time.  Pee before  and after having sex (intercourse).  Wipe from front to back after you poop (bowel movement) if you are a woman. Use each tissue only once. GET HELP RIGHT AWAY IF:   You have back pain.  You have lower belly (abdominal) pain.  You have chills.  You feel sick to your stomach (nauseous).  You throw up (vomit).  Your burning or discomfort with peeing does not go away.  You have a fever.  Your symptoms are not better in 3 days. MAKE SURE YOU:   Understand these instructions.  Will watch your condition.  Will get help right away if you are not doing well or get worse. Document Released: 07/03/2007 Document Revised: 10/09/2011 Document Reviewed: 08/15/2011 [facility] Patient Information 2015 [place], [place]. This information is not intended to replace advice given to you by your health care provider. Make sure you discuss any questions you have with your health care provider.

## 2019-07-09 NOTE — Telephone Encounter (Signed)
[facility] sent over a fax requesting an alternative to Glucophage XR  Which they are asking for Metformin ER tabs

## 2019-07-10 LAB — URINE CULTURE: Culture: >=100000 — AB

## 2019-07-11 ENCOUNTER — Telehealth: Payer: Self-pay | Admitting: Emergency Medicine

## 2019-07-11 NOTE — Telephone Encounter (Signed)
Post ED Visit - Positive Culture Follow-up  Culture report reviewed by antimicrobial stewardship pharmacist: [place] Team []  Elenor Quinones, Pharm.D. []  Heide Guile, Pharm.D., BCPS AQ-ID []  Parks Neptune, Pharm.D., BCPS []  Alycia Rossetti, Pharm.D., BCPS []  Ryan, [place].D., BCPS, AAHIVP []  Legrand Como, Pharm.D., BCPS, AAHIVP [x]  Salome Arnt, PharmD, BCPS []  Johnnette Gourd, PharmD, BCPS []  Hughes Better, PharmD, BCPS []  Leeroy Cha, PharmD []  Laqueta Linden, PharmD, BCPS []  Albertina Parr, PharmD  [facility] Team []  Leodis Sias, PharmD []  Lindell Spar, PharmD []  Royetta Asal, PharmD []  Graylin Shiver, Rph []  Rema Fendt) Glennon Mac, PharmD []  Arlyn Dunning, PharmD []  Netta Cedars, PharmD []  Dia Sitter, PharmD []  Leone Haven, PharmD []  Gretta Arab, PharmD []  Theodis Shove, PharmD []  Peggyann Juba, PharmD []  Reuel Boom, PharmD   Positive urine culture Treated with Cephalexin (rx'd by PCP), organism sensitive to the same and no further patient follow-up is required at this time.  Sandi Raveling Tamie Minteer 07/11/2019, 11:43 AM

## 2019-07-12 ENCOUNTER — Ambulatory Visit: Payer: Medicare Other

## 2019-07-12 ENCOUNTER — Other Ambulatory Visit: Payer: Self-pay

## 2019-07-12 NOTE — Progress Notes (Unsigned)
Chronic Care Management Pharmacy  Name: [name]  MRN: [id number] DOB: 10-19-1955  Chief Complaint/ HPI  [name],  64 y.o. , female presents for their Initial CCM visit with the clinical pharmacist via telephone due to COVID-19 Pandemic.   PCP : Brunetta Jeans, PA-C  Their chronic conditions include:  Chronic conditions include:  No diagnosis found.   Office Visits:  07/09/2019 (PCP): Xanax decrease from TID to BID. Cymbalta decreased from 60 mg daily to 30 mg daily x 7 days until f/u. RN to come to patient's home  07/07/2019 (ED): Hallucinations, confusionGeneralized cerebral atrophy. No acute intracranial abnormality.  Consult Visit: *** Patient Active Problem List   Diagnosis Date Noted  . Diabetes mellitus, new onset ([place]) 04/12/2019  . Tremor 11/05/2018  . Chronic respiratory failure with hypoxia ([place]) 01/12/2018  . GERD with esophagitis 11/26/2017  . Dysphagia 08/20/2017  . Chronic pain syndrome 08/04/2017  . Comorbid sleep-related hypoventilation 08/04/2017  . OSA (obstructive sleep apnea) 08/04/2017  . Hepatic steatosis 08/04/2017  . Polyp of ascending colon   . Diverticulosis of colon without hemorrhage   . Gastritis and gastroduodenitis   . Hypothyroidism 07/22/2017  . Anxiety and depression 07/22/2017  . Iron deficiency anemia 07/22/2017  . Pilar cyst 07/22/2017  . Morbid obesity due to excess calories ([place]) 03/19/2017  . Anemia 03/19/2017  . COPD GOLD II still smoking  03/18/2017  . Degenerative lumbar spinal stenosis 02/17/2017  . S/P right hip revision 09/02/2016  . S/P hip replacement 09/02/2016  . Instability of prosthetic hip ([place]) 03/01/2016  . Dislocation of hip prosthesis ([place]) 02/17/2016  . Abnormal nuclear stress test: Intermediate Risk 10/31/2015  . Atypical angina ([place]) 10/31/2015  . Trigger finger, acquired 08/20/2012  . Cigarette smoker 12/12/2008  . Hyperlipidemia 11/17/2008  . HYPERTENSION, BENIGN 11/16/2008  . GEN  OSTEOARTHROSIS INVOLVING MULTIPLE SITES 11/16/2008   Past Surgical History:  Procedure Laterality Date  . APPENDECTOMY     1985  . BIOPSY  07/24/2017   Procedure: BIOPSY;  Surgeon: Milus Banister, MD;  Location: [facility];  Service: Endoscopy;;  . CARDIAC CATHETERIZATION N/A 10/31/2015   Procedure: Left Heart Cath and Coronary Angiography;  Surgeon: Leonie Man, MD;  Location: [facility];  Service: Cardiovascular;  Laterality: N/A;  . CARPAL TUNNEL RELEASE Left   . CARPAL TUNNEL RELEASE Right   . CHOLECYSTECTOMY  late 1980's  . COLONOSCOPY WITH PROPOFOL N/A 07/24/2017   Procedure: COLONOSCOPY WITH PROPOFOL;  Surgeon: Milus Banister, MD;  Location: [facility];  Service: Endoscopy;  Laterality: N/A;  . ESOPHAGOGASTRODUODENOSCOPY N/A 07/24/2017   Procedure: ESOPHAGOGASTRODUODENOSCOPY (EGD);  Surgeon: Milus Banister, MD;  Location: [facility];  Service: Endoscopy;  Laterality: N/A;  . GALLBLADDER SURGERY  1991  . HIP CLOSED REDUCTION Right 01/08/2016   Procedure: CLOSED MANIPULATION HIP;  Surgeon: Susa Day, MD;  Location: [facility];  Service: Orthopedics;  Laterality: Right;  . HIP CLOSED REDUCTION Right 01/19/2016   Procedure: ATTEMPTED CLOSED REDUCTION RIGHT HIP;  Surgeon: Wylene Simmer, MD;  Location: [facility];  Service: Orthopedics;  Laterality: Right;  . HIP CLOSED REDUCTION Right 01/20/2016   Procedure: CLOSED REDUCTION RIGHT TOTAL HIP;  Surgeon: Paralee Cancel, MD;  Location: [facility];  Service: Orthopedics;  Laterality: Right;  . HIP CLOSED REDUCTION Right 02/17/2016   Procedure: CLOSED REDUCTION RIGHT TOTAL HIP;  Surgeon: Rod Can, MD;  Location: [facility];  Service: Orthopedics;  Laterality: Right;  . HIP CLOSED REDUCTION Right  02/28/2016   Procedure: CLOSED REDUCTION HIP;  Surgeon: Nicholes Stairs, MD;  [facility];  Service: Orthopedics;  Laterality: Right;  . POLYPECTOMY  07/24/2017   Procedure: POLYPECTOMY;  Surgeon: Milus Banister, MD;  [facility]: [facility];  Service: Endoscopy;;  . TONSILLECTOMY    . TOTAL ABDOMINAL HYSTERECTOMY     1985, with 1 ovary removed and 2 nd ovary removed 2003  . TOTAL HIP ARTHROPLASTY Right    Original surgery 2006 with revision 2010  . TOTAL HIP REVISION Right 01/01/2016   Procedure: TOTAL HIP REVISION;  Surgeon: Paralee Cancel, MD;  Location: [facility];  Service: Orthopedics;  Laterality: Right;  . TOTAL HIP REVISION Right 03/02/2016   Procedure: TOTAL HIP REVISION;  Surgeon: Paralee Cancel, MD;  [facility];  Service: Orthopedics;  Laterality: Right;  . TOTAL HIP REVISION Right 09/02/2016   Procedure: Right hip constrained liner- posterior;  Surgeon: Paralee Cancel, MD;  Location: [facility];  Service: Orthopedics;  Laterality: Right;  . ULNAR NERVE TRANSPOSITION Right    Social History   Socioeconomic History  . Marital status: Married    Spouse name: Not on file  . Number of children: Not on file  . Years of education: Not on file  . Highest education level: Not on file  Occupational History  . Not on file  Tobacco Use  . Smoking status: Current Every Day Smoker    Packs/day: 1.50    Years: 46.00    Pack years: 69.00    Types: Cigarettes  . Smokeless tobacco: Never Used  Vaping Use  . Vaping Use: Never used  Substance and Sexual Activity  . Alcohol use: No  . Drug use: No  . Sexual activity: Yes  Other Topics Concern  . Not on file  Social History Narrative   Right handed    Caffeine~ 2 cups per day    Lives at home with husband (strained relationship)   Primary caretaker for disabled brother who had aneurism   Daughter died 24-Jun-2018    Social Determinants of Health   Financial Resource Strain:   . Difficulty of Paying Living Expenses:   Food Insecurity:   . Worried About Charity fundraiser in the Last Year:   . Arboriculturist in the Last Year:   Transportation Needs:   . Film/video editor (Medical):   Marland Kitchen Lack of Transportation (Non-Medical):   Physical Activity:   . Days of  Exercise per Week:   . Minutes of Exercise per Session:   Stress:   . Feeling of Stress :   Social Connections:   . Frequency of Communication with Friends and Family:   . Frequency of Social Gatherings with Friends and Family:   . Attends Religious Services:   . Active Member of Clubs or Organizations:   . Attends Archivist Meetings:   Marland Kitchen Marital Status:    Family History  Problem Relation Age of Onset  . COPD Mother   . Heart disease Mother   . Lung disease Father        Asbestosis  . Heart attack Father   . Heart disease Father   . Cerebral aneurysm Brother   . Aneurysm Brother        Brain  . Epilepsy Son   . Arthritis Maternal Grandmother   . Heart disease Maternal Grandmother   . Asthma Maternal Grandfather   . Cancer Maternal Grandfather   . Arthritis Paternal Grandmother   .  Heart disease Paternal Grandmother   . Stroke Paternal Grandmother   . Early death Paternal Grandfather   . Heart disease Paternal Grandfather    Allergies  Allergen Reactions  . Nsaids Diarrhea  . Aleve [Naproxen Sodium] Other (See Comments)    Headache   . Codeine Nausea Only and Other (See Comments)    GI upset  . Penicillins Nausea Only and Other (See Comments)    GI upset   . Sulfonamide Derivatives Hives   Outpatient Encounter Medications as of 07/12/2019  Medication Sig Note  . albuterol (PROAIR HFA) 108 (90 Base) MCG/ACT inhaler 2 puffs every 4 hours as needed only  if your can't catch your breath (Patient taking differently: Inhale 2 puffs into the lungs every 4 (four) hours as needed for wheezing or shortness of breath. if your can't catch your breath)   . ALPRAZolam (XANAX) 1 MG tablet TAKE 1 TABLET BY MOUTH FOUR TIMES DAILY AS NEEDED FOR ANXIETY (Patient taking differently: Take 1 mg by mouth 4 (four) times daily as needed for anxiety. )   . atorvastatin (LIPITOR) 20 MG tablet Take 1 tablet (20 mg total) by mouth at bedtime.   . budesonide-formoterol (SYMBICORT)  160-4.5 MCG/ACT inhaler Inhale 2 puffs into the lungs 2 (two) times daily.   . cephALEXin (KEFLEX) 500 MG capsule Take 1 capsule (500 mg total) by mouth 2 (two) times daily for 7 days.   . DULoxetine (CYMBALTA) 30 MG capsule Take 1 capsule (30 mg total) by mouth daily.   . fluticasone (FLONASE) 50 MCG/ACT nasal spray Place 2 sprays into both nostrils daily.   . furosemide (LASIX) 20 MG tablet Take 1 tablet (20 mg total) by mouth daily as needed.   . gabapentin (NEURONTIN) 300 MG capsule Take 600 mg by mouth 2 (two) times daily.  12/16/2018: Possibly causing tremors   . HYDROcodone-acetaminophen (NORCO) 10-325 MG tablet Take 1 tablet by mouth every 8 (eight) hours as needed.   . hydrOXYzine (ATARAX/VISTARIL) 10 MG tablet Take 1 tablet (10 mg total) by mouth 3 (three) times daily as needed.   Marland Kitchen JANUVIA 25 MG tablet TAKE 1 TABLET BY MOUTH DAILY   . levothyroxine (SYNTHROID) 75 MCG tablet TAKE 1 TABLET BY MOUTH DAILY BEFORE BREAKFAST   . loratadine (CLARITIN) 10 MG tablet Take 10 mg by mouth daily.   . meloxicam (MOBIC) 15 MG tablet Take 15 mg by mouth daily.   . metFORMIN (GLUMETZA) 500 MG (MOD) 24 hr tablet Take 2 tablets (1,000 mg total) by mouth daily with breakfast.   . nystatin (MYCOSTATIN/NYSTOP) powder APPLY TO THE AFFECTED AREA(S) TWICE DAILY   . omeprazole (PRILOSEC) 40 MG capsule TAKE ONE CAPSULE BY MOUTH DAILY BEFORE BREAKFAST and TAKE ONE CAPSULE DAILY BEFORE DINNER   . ondansetron (ZOFRAN) 4 MG tablet Take 1 tablet (4 mg total) by mouth every 8 (eight) hours as needed for nausea or vomiting.   . OXYGEN Inhale 2 L into the lungs at bedtime.    . potassium chloride (KLOR-CON) 10 MEQ tablet Take 1 tablet (10 mEq total) by mouth at bedtime.   . Tiotropium Bromide Monohydrate (SPIRIVA RESPIMAT) 2.5 MCG/ACT AERS Inhale 2 puffs into the lungs daily.   Marland Kitchen triamcinolone cream (KENALOG) 0.1 % Apply 1 application topically daily.   Marland Kitchen triamterene-hydrochlorothiazide (MAXZIDE-25) 37.5-25 MG tablet  Take 1 tablet by mouth daily.    No facility-administered encounter medications on file as of 07/12/2019.   Patient Care Team    Relationship Specialty Notifications  Start End  Brunetta Jeans, PA-C PCP - General Family Medicine  07/18/17   Milus Banister, MD Attending Physician Gastroenterology  07/18/17   Tanda Rockers, MD Consulting Physician Pulmonary Disease  07/18/17   Dr. Retta Mac    07/18/17    Comment: Dentist  Paralee Cancel, MD Consulting Physician Orthopedic Surgery  07/18/17   Latanya Maudlin, MD Consulting Physician Orthopedic Surgery  07/18/17   Kathrynn Ducking, MD Consulting Physician Neurology  12/16/18   Okey Regal, [place] Physician Optometry  12/16/18    Comment: Richarda Osmond, [facility] Pharmacist Pharmacist  05/24/19    Comment: PHONE NUMBER [phone]  Deloria Lair, NP [place] [facility]  Admissions 07/09/19    Current Diagnosis/Assessment: Goals Addressed   None    Hypertension   BP Readings from Last 3 Encounters:  07/09/19 130/82  07/07/19 125/78  05/31/19 124/80   Patient has failed these meds in the past: *** Patient checks BP at home {CHL HP BP Monitoring Frequency:[phone]} Patient home BP readings are ranging: ***   Plan  Continue {CHL HP Upstream Pharmacy Plans:[phone]}     Diabetes   Recent Relevant Labs: Lab Results  Component Value Date/Time   HGBA1C 9.3 (A) 03/22/2019 02:44 PM   EGFR 60 (L) 06/30/2014 02:48 PM   MICROALBUR 0.4 03/12/2019 01:54 PM     Checking BG: {CHL HP Blood Glucose Monitoring Frequency:[phone]}  Recent FBG Readings: Recent pre-meal BG readings: *** Recent 2hr PP BG readings:  *** Recent HS BG readings: *** Patient has failed these meds in past: *** Patient is currently {CHL Controlled/[phone]} on the following medications: ***  Last diabetic Foot exam: No results found for: HMDIABEYEEXA  Last diabetic Eye exam: No results found for:  HMDIABFOOTEX   We discussed: {CHL HP Upstream Pharmacy [phone]}  Plan  Continue {CHL HP Upstream Pharmacy Plans:[phone]} COPD / Asthma / Tobacco   Last spirometry score: ***  Gold Grade: {CHL HP Upstream Pharm COPD Gold MBWGY:[id number]} Current COPD Classification:  {CHL HP Upstream Pharm COPD Classification:[phone]}  Eosinophil count:   Lab Results  Component Value Date/Time   EOSPCT 3.1 03/12/2019 01:54 PM   EOSPCT 8.6 (H) 06/30/2014 02:48 PM  %                               Eos (Absolute):  Lab Results  Component Value Date/Time   EOSABS 285 03/12/2019 01:54 PM   EOSABS 0.7 (H) 06/30/2014 02:48 PM    Tobacco Status:  Social History   Tobacco Use  Smoking Status Current Every Day Smoker  . Packs/day: 1.50  . Years: 46.00  . Pack years: 69.00  . Types: Cigarettes  Smokeless Tobacco Never Used    Patient has failed these meds in past: *** Patient is currently {CHL Controlled/[phone]} on the following medications: *** Using maintenance inhaler regularly? {yes/no:20286} Frequency of rescue inhaler use:  {CHL HP Upstream Pharm Inhaler BLTJ:0300923300}  We discussed:  {CHL HP Upstream Pharmacy [phone]}  Plan  Continue {CHL HP Upstream Pharmacy Plans:[phone]}  Heart Failure   Type: {type of heart failure:30421350}  Last ejection fraction: *** NYHA Class: {CHL HP Upstream Pharm NYHA Class:[phone]} AHA HF Stage: {CHL HP Upstream Pharm AHA HF Stage:[phone]}  Patient has failed these meds in past: *** Patient is currently {CHL Controlled/Uncontrolled:([phone]} on the following medications: ***  We discussed {CHL HP Upstream Pharmacy [phone]}  Plan  Continue {CHL  HP Upstream Pharmacy UVOZD:6644034742}  Hyperlipidemia   Lipid Panel     Component Value Date/Time   CHOL 135 11/04/2018 1522   TRIG 194.0 (H) 11/04/2018 1522   HDL 34.80 (L) 11/04/2018 1522   LDLCALC 61 11/04/2018  1522   LDLDIRECT 72.0 08/31/2018 1411     The 10-year ASCVD risk score Mikey Bussing DC Jr., et al., 2013) is: 21.6%   Values used to calculate the score:     Age: 72 years     Sex: Female     Is Non-Hispanic African American: No     Diabetic: Yes     Tobacco smoker: Yes     Systolic Blood Pressure: 595 mmHg     Is BP treated: Yes     HDL Cholesterol: 34.8 mg/dL     Total Cholesterol: 135 mg/dL   Patient has failed these meds in past: *** Patient is currently {CHL Controlled/Uncontrolled:[phone]} on the following medications:  . ***  We discussed:  {CHL HP Upstream Pharmacy discussion:239 523 6335}  Plan  Continue {CHL HP Upstream Pharmacy Plans:[phone]} Hypothyroidism   Lab Results  Component Value Date/Time   TSH 4.89 (H) 03/12/2019 01:54 PM   TSH 6.22 (H) 02/18/2019 11:15 AM   TSH 4.40 11/10/2018 03:16 PM    Patient has failed these meds in past: *** Patient is currently {CHL Controlled/Uncontrolled:[phone]} on the following medications:  . ***  We discussed:  {CHL HP Upstream Pharmacy discussion:239 523 6335}  Plan  Continue {CHL HP Upstream Pharmacy Plans:[phone]}  GERD   Patient {Actions; denies-reports:120008::"denies"} {gerd assoc sx:31969:o:"dysphagia","heartburn","nausea"}.  Expresses understanding to avoid triggers such as {causes; exacerbators GERD:13199}.  Currently {CHL Controlled/Uncontrolled:[phone]} on: . ***  Plan   {rxplan:23810::"Continue current medication."}  Osteopenia / Osteoporosis   Last DEXA Scan: ***   T-Score femoral neck: ***  T-Score total hip: ***  T-Score lumbar spine: ***  T-Score forearm radius: ***  10-year probability of major osteoporotic fracture: ***  10-year probability of hip fracture: ***  No results found for: VD25OH   Patient {is;is not an osteoporosis candidate:23886}  Patient has failed these meds in past: *** Patient is currently {CHL Controlled/Uncontrolled:[phone]} on the following  medications: ***  We discussed:  {Osteoporosis Counseling:23892}  Plan  Continue {CHL HP Upstream Pharmacy GLOVF:6433295188} Tobacco Abuse   Tobacco Status:  Social History   Tobacco Use  Smoking Status Current Every Day Smoker  . Packs/day: 1.50  . Years: 46.00  . Pack years: 69.00  . Types: Cigarettes  Smokeless Tobacco Never Used    Patient smokes {Time to first cigarette:23873} Patient triggers include: {Smoking Triggers:23882} On a scale of 1-10, reports MOTIVATION to quit is *** On a scale of 1-10, reports CONFIDENCE in quitting is ***  Patient has failed these meds in past: *** Patient is currently {CHL Controlled/Uncontrolled:205-064-7929} on the following medications: ***  We discussed:  {Smoking Cessation Counseling:23883}  Plan  Continue {CHL HP Upstream Pharmacy CZYSA:6301601093} ***   Patient has failed these meds in past: *** Patient is currently {CHL Controlled/Uncontrolled:[phone]} on the following medications:  . ***  We discussed:  ***  Plan  Continue {CHL HP Upstream Pharmacy ATFTD:3220254270} Vaccines   Reviewed and discussed patient's vaccination history.    Immunization History  Administered Date(s) Administered  . Influenza,inj,Quad PF,6+ Mos 10/02/2018  . Influenza-Unspecified 01/16/2011, 11/26/2011, 12/22/2012, 11/17/2013  . Td 06/07/2005    Plan  Recommended patient receive *** vaccine in *** office/pharmacy.  Medication Management   Receives prescription medications from:  [facility] [address], [place] [address] Phone: [phone] Fax: [phone]    ***  Plan  {[facility] Pharmacy YZJQ:96438}.  Follow up: *** month phone visit.  *** ______________ Visit Information SDOH (Social Determinants of Health) assessments performed: Yes.  Ms. Devlin was given information about Chronic Care Management services today including:  1. CCM  service includes personalized support from designated clinical staff supervised by her physician, including individualized plan of care and coordination with other care providers 2. 24/7 contact phone numbers for assistance for urgent and routine care needs. 3. Standard insurance, coinsurance, copays and deductibles apply for chronic care management only during months in which we provide at least 20 minutes of these services. Most insurances cover these services at 100%, however patients may be responsible for any copay, coinsurance and/or deductible if applicable. This service may help you avoid the need for more expensive face-to-face services. 4. Only one practitioner may furnish and bill the service in a calendar month. 5. The patient may stop CCM services at any time (effective at the end of the month) by phone call to the office staff.  Patient agreed to services and verbal consent obtained.   [name], Pharm.D., BCGP Clinical Pharmacist [facility] [phone]

## 2019-07-16 ENCOUNTER — Other Ambulatory Visit: Payer: Self-pay | Admitting: Physician Assistant

## 2019-07-19 ENCOUNTER — Telehealth: Payer: Self-pay | Admitting: Physician Assistant

## 2019-07-19 ENCOUNTER — Other Ambulatory Visit: Payer: Self-pay

## 2019-07-19 MED ORDER — ACCU-CHEK GUIDE VI STRP: 1.0000 | ORAL_STRIP | Freq: Three times a day (TID) | 11 refills | Status: DC | PRN

## 2019-07-19 NOTE — Telephone Encounter (Signed)
Patient requesting test strips Rx sent

## 2019-07-19 NOTE — Telephone Encounter (Signed)
Pt called in asking for a new script for the Accu- Chek testing meter to be sent into [facility]. She states that the needle keeps breaking while she is trying to test. Please advise

## 2019-07-21 ENCOUNTER — Encounter: Payer: Self-pay | Admitting: Neurology

## 2019-07-27 ENCOUNTER — Ambulatory Visit (INDEPENDENT_AMBULATORY_CARE_PROVIDER_SITE_OTHER): Payer: Medicare Other | Admitting: Physician Assistant

## 2019-07-27 ENCOUNTER — Other Ambulatory Visit: Payer: Self-pay

## 2019-07-27 ENCOUNTER — Ambulatory Visit: Payer: Medicare Other | Admitting: Adult Health

## 2019-07-27 ENCOUNTER — Encounter: Payer: Self-pay | Admitting: Physician Assistant

## 2019-07-27 VITALS — BP 132/76 | HR 96 | Temp 98.3°F | Resp 18 | Ht 62.0 in | Wt 232.6 lb

## 2019-07-27 DIAGNOSIS — R441 Visual hallucinations: Secondary | ICD-10-CM

## 2019-07-27 DIAGNOSIS — N3090 Cystitis, unspecified without hematuria: Secondary | ICD-10-CM

## 2019-07-27 DIAGNOSIS — F329 Major depressive disorder, single episode, unspecified: Secondary | ICD-10-CM | POA: Diagnosis not present

## 2019-07-27 DIAGNOSIS — F419 Anxiety disorder, unspecified: Secondary | ICD-10-CM

## 2019-07-27 LAB — POCT URINALYSIS DIPSTICK
Bilirubin, UA: NEGATIVE
Blood, UA: NEGATIVE
Glucose, UA: NEGATIVE
Ketones, UA: NEGATIVE
Leukocytes, UA: NEGATIVE
Nitrite, UA: NEGATIVE
Protein, UA: NEGATIVE
Spec Grav, UA: 1.015 (ref 1.010–1.025)
Urobilinogen, UA: 0.2 E.U./dL
pH, UA: 6 (ref 5.0–8.0)

## 2019-07-27 MED ORDER — VENLAFAXINE HCL ER 37.5 MG PO CP24
37.5000 mg | ORAL_CAPSULE | Freq: Every day | ORAL | 1 refills | Status: DC
Start: 2019-07-27 — End: 2019-08-12

## 2019-07-27 NOTE — Patient Instructions (Addendum)
Please keep hydrated and get plenty of rest. I am switching your Cymbalta to Effexor.  Start with next dose.  I am setting you up with Psychiatry and Neurology for further evaluation and management.   If any acutely worsening symptoms I want you to go to [facility] ER for evaluation and psychiatric assessment.   Make sure to take the Gabapentin TID as directed by your specialist.

## 2019-07-27 NOTE — Progress Notes (Signed)
Patient presents to clinic today for close follow-up of urinary symptoms, mood and visual hallucinations. Patient s/p ER workup revealing cystitis but negative CT Head and other lab workup. Patient has completed entire course of antibiotics for cystitis. Denies dysuria, urinary urgency, flank pain, nausea/vomiting, fever or chills. Has decreased Cymbalta as directed at last visit without improvement in symptoms. Had an argument with her son about current situation and notes he has left their home. This has caused a significant deterioration in mood. Denies SI/HI. Still without auditory hallucinations. Still noting visual hallucinations that are non-threatening. None of the hallucinations directly communicate with her.   Past Medical History:  Diagnosis Date  . Allergic rhinitis   . Anemia   . Anxiety   . Chicken pox   . Chronic back pain   . COPD (chronic obstructive pulmonary disease) ([place])   . Depression   . Essential hypertension   . GERD (gastroesophageal reflux disease)   . Headache    migraines  . History of gastritis    EGD 2015  . History of home oxygen therapy    2 liters at hs last 6 months  . Hyperlipidemia   . Hypothyroidism   . Migraines   . Osteoarthritis    oa  . Scoliosis     Current Outpatient Medications on File Prior to Visit  Medication Sig Dispense Refill  . albuterol (PROAIR HFA) 108 (90 Base) MCG/ACT inhaler 2 puffs every 4 hours as needed only  if your can't catch your breath (Patient taking differently: Inhale 2 puffs into the lungs every 4 (four) hours as needed for wheezing or shortness of breath. if your can't catch your breath) 6.7 g 0  . ALPRAZolam (XANAX) 1 MG tablet Take 1 tablet (1 mg total) by mouth 3 (three) times daily as needed for anxiety. 90 tablet 0  . atorvastatin (LIPITOR) 20 MG tablet Take 1 tablet (20 mg total) by mouth at bedtime. 90 tablet 2  . budesonide-formoterol (SYMBICORT) 160-4.5 MCG/ACT inhaler Inhale 2 puffs into the lungs 2  (two) times daily. 3 Inhaler 0  . DULoxetine (CYMBALTA) 30 MG capsule Take 1 capsule (30 mg total) by mouth daily. 30 capsule 1  . fluticasone (FLONASE) 50 MCG/ACT nasal spray Place 2 sprays into both nostrils daily.    . furosemide (LASIX) 20 MG tablet Take 1 tablet (20 mg total) by mouth daily as needed. 30 tablet 0  . gabapentin (NEURONTIN) 300 MG capsule Take 600 mg by mouth 2 (two) times daily.     Marland Kitchen glucose blood (ACCU-CHEK GUIDE) test strip 1 each by Other route 3 (three) times daily as needed for other. as directed Dx E11.9 100 strip 11  . HYDROcodone-acetaminophen (NORCO) 10-325 MG tablet Take 1 tablet by mouth every 8 (eight) hours as needed. 90 tablet 0  . hydrOXYzine (ATARAX/VISTARIL) 10 MG tablet Take 1 tablet (10 mg total) by mouth 3 (three) times daily as needed. 30 tablet 1  . JANUVIA 25 MG tablet TAKE 1 TABLET BY MOUTH DAILY 30 tablet 1  . levothyroxine (SYNTHROID) 75 MCG tablet TAKE 1 TABLET BY MOUTH DAILY BEFORE BREAKFAST 90 tablet 1  . loratadine (CLARITIN) 10 MG tablet Take 10 mg by mouth daily.    . meloxicam (MOBIC) 15 MG tablet Take 15 mg by mouth daily.    . metFORMIN (GLUMETZA) 500 MG (MOD) 24 hr tablet Take 2 tablets (1,000 mg total) by mouth daily with breakfast. 180 tablet 1  . nystatin (MYCOSTATIN/NYSTOP) powder APPLY  TO THE AFFECTED AREA(S) TWICE DAILY 15 g 0  . omeprazole (PRILOSEC) 40 MG capsule TAKE ONE CAPSULE BY MOUTH DAILY BEFORE BREAKFAST and TAKE ONE CAPSULE DAILY BEFORE DINNER 60 capsule 2  . ondansetron (ZOFRAN) 4 MG tablet Take 1 tablet (4 mg total) by mouth every 8 (eight) hours as needed for nausea or vomiting. 10 tablet 0  . OXYGEN Inhale 2 L into the lungs at bedtime.     . potassium chloride (KLOR-CON) 10 MEQ tablet Take 1 tablet (10 mEq total) by mouth at bedtime. 90 tablet 2  . Tiotropium Bromide Monohydrate (SPIRIVA RESPIMAT) 2.5 MCG/ACT AERS Inhale 2 puffs into the lungs daily. 12 g 0  . triamcinolone cream (KENALOG) 0.1 % Apply 1 application  topically daily. 30 g 0  . triamterene-hydrochlorothiazide (MAXZIDE-25) 37.5-25 MG tablet Take 1 tablet by mouth daily. 90 tablet 2   No current facility-administered medications on file prior to visit.    Allergies  Allergen Reactions  . Nsaids Diarrhea  . Aleve [Naproxen Sodium] Other (See Comments)    Headache   . Codeine Nausea Only and Other (See Comments)    GI upset  . Penicillins Nausea Only and Other (See Comments)    GI upset   . Sulfonamide Derivatives Hives    Family History  Problem Relation Age of Onset  . COPD Mother   . Heart disease Mother   . Lung disease Father        Asbestosis  . Heart attack Father   . Heart disease Father   . Cerebral aneurysm Brother   . Aneurysm Brother        Brain  . Epilepsy Son   . Arthritis Maternal Grandmother   . Heart disease Maternal Grandmother   . Asthma Maternal Grandfather   . Cancer Maternal Grandfather   . Arthritis Paternal Grandmother   . Heart disease Paternal Grandmother   . Stroke Paternal Grandmother   . Early death Paternal Grandfather   . Heart disease Paternal Grandfather     Social History   Socioeconomic History  . Marital status: Married    Spouse name: Not on file  . Number of children: Not on file  . Years of education: Not on file  . Highest education level: Not on file  Occupational History  . Not on file  Tobacco Use  . Smoking status: Current Every Day Smoker    Packs/day: 1.50    Years: 46.00    Pack years: 69.00    Types: Cigarettes  . Smokeless tobacco: Never Used  Vaping Use  . Vaping Use: Never used  Substance and Sexual Activity  . Alcohol use: No  . Drug use: No  . Sexual activity: Yes  Other Topics Concern  . Not on file  Social History Narrative   Right handed    Caffeine~ 2 cups per day    Lives at home with husband (strained relationship)   Primary caretaker for disabled brother who had aneurism   Daughter died Jul 05, 2018    Social Determinants of Health    Financial Resource Strain:   . Difficulty of Paying Living Expenses:   Food Insecurity:   . Worried About Charity fundraiser in the Last Year:   . Arboriculturist in the Last Year:   Transportation Needs:   . Film/video editor (Medical):   Marland Kitchen Lack of Transportation (Non-Medical):   Physical Activity:   . Days of Exercise per Week:   .  Minutes of Exercise per Session:   Stress:   . Feeling of Stress :   Social Connections:   . Frequency of Communication with Friends and Family:   . Frequency of Social Gatherings with Friends and Family:   . Attends Religious Services:   . Active Member of Clubs or Organizations:   . Attends Archivist Meetings:   Marland Kitchen Marital Status:     Review of Systems - See HPI.  All other ROS are negative.  Resp 18   Ht 5\' 2"  (1.575 m)   Wt 232 lb 9.6 oz (105.5 kg)   BMI 42.54 kg/m   Physical Exam  Recent Results (from the past 2160 hour(s))  CBG monitoring, ED     Status: Abnormal   Collection Time: 04/29/19  8:03 PM  Result Value Ref Range   Glucose-Capillary 153 (H) 70 - 99 mg/dL    Comment: Glucose reference range applies only to samples taken after fasting for at least 8 hours.  CBC     Status: Abnormal   Collection Time: 04/29/19  9:48 PM  Result Value Ref Range   WBC 7.5 4.0 - 10.5 K/uL   RBC 3.75 (L) 3.87 - 5.11 MIL/uL   Hemoglobin 10.1 (L) 12.0 - 15.0 g/dL   HCT 35.1 (L) 36 - 46 %   MCV 93.6 80.0 - 100.0 fL   MCH 26.9 26.0 - 34.0 pg   MCHC 28.8 (L) 30.0 - 36.0 g/dL   RDW 17.2 (H) 11.5 - 15.5 %   Platelets 178 150 - 400 K/uL   nRBC 0.0 0.0 - 0.2 %    Comment: Performed at [facility], [address], [place], [place] [address]  Basic metabolic panel     Status: Abnormal   Collection Time: 04/29/19  9:48 PM  Result Value Ref Range   Sodium 139 135 - 145 mmol/L   Potassium 4.1 3.5 - 5.1 mmol/L   Chloride 104 98 - 111 mmol/L   CO2 27 22 - 32 mmol/L   Glucose, Bld 152 (H) 70 - 99 mg/dL    Comment: Glucose reference  range applies only to samples taken after fasting for at least 8 hours.   BUN 15 8 - 23 mg/dL   Creatinine, Ser 0.77 0.44 - 1.00 mg/dL   Calcium 8.6 (L) 8.9 - 10.3 mg/dL   GFR calc non Af Amer >60 >60 mL/min   GFR calc Af Amer >60 >60 mL/min   Anion gap 8 5 - 15    Comment: Performed at [facility], [address], [place], [place] [address]  Comprehensive metabolic panel     Status: Abnormal   Collection Time: 07/07/19  2:04 PM  Result Value Ref Range   Sodium 141 135 - 145 mmol/L   Potassium 4.4 3.5 - 5.1 mmol/L   Chloride 101 98 - 111 mmol/L   CO2 32 22 - 32 mmol/L   Glucose, Bld 99 70 - 99 mg/dL    Comment: Glucose reference range applies only to samples taken after fasting for at least 8 hours.   BUN 17 8 - 23 mg/dL   Creatinine, Ser 1.01 (H) 0.44 - 1.00 mg/dL   Calcium 9.0 8.9 - 10.3 mg/dL   Total Protein 7.3 6.5 - 8.1 g/dL   Albumin 3.5 3.5 - 5.0 g/dL   AST 37 15 - 41 U/L   ALT 29 0 - 44 U/L   Alkaline Phosphatase 89 38 - 126 U/L   Total Bilirubin  0.6 0.3 - 1.2 mg/dL   GFR calc non Af Amer 59 (L) >60 mL/min   GFR calc Af Amer >60 >60 mL/min   Anion gap 8 5 - 15    Comment: Performed at [facility], [address] [place], [place] [address]  CBC     Status: Abnormal   Collection Time: 07/07/19  2:04 PM  Result Value Ref Range   WBC 8.9 4.0 - 10.5 K/uL   RBC 4.18 3.87 - 5.11 MIL/uL   Hemoglobin 11.3 (L) 12.0 - 15.0 g/dL   HCT 37.4 36 - 46 %   MCV 89.5 80.0 - 100.0 fL   MCH 27.0 26.0 - 34.0 pg   MCHC 30.2 30.0 - 36.0 g/dL   RDW 19.4 (H) 11.5 - 15.5 %   Platelets 174 150 - 400 K/uL   nRBC 0.0 0.0 - 0.2 %    Comment: Performed at [facility], [address], [place], [place] [address]  Urinalysis, Routine w reflex microscopic     Status: Abnormal   Collection Time: 07/07/19  3:04 PM  Result Value Ref Range   Color, Urine YELLOW YELLOW   APPearance CLEAR CLEAR   Specific Gravity, Urine 1.014 1.005 - 1.030   pH 6.0 5.0 - 8.0   Glucose, UA NEGATIVE NEGATIVE mg/dL    Hgb urine dipstick NEGATIVE NEGATIVE   Bilirubin Urine NEGATIVE NEGATIVE   Ketones, ur NEGATIVE NEGATIVE mg/dL   Protein, ur NEGATIVE NEGATIVE mg/dL   Nitrite POSITIVE (A) NEGATIVE   Leukocytes,Ua NEGATIVE NEGATIVE   WBC, UA 0-5 0 - 5 WBC/hpf   Bacteria, UA MANY (A) NONE SEEN   Squamous Epithelial / LPF 0-5 0 - 5    Comment: Performed at [facility], [address] [place], [place] [address]  Urine culture     Status: Abnormal   Collection Time: 07/07/19  3:05 PM   Specimen: Urine, Random  Result Value Ref Range   Specimen Description      URINE, RANDOM Performed at [facility], [address] [place], [place] [address]    Special Requests      NONE Performed at [facility], [address], [place], [place] [address]    Culture >=100,000 COLONIES/mL ESCHERICHIA COLI (A)    Report Status 07/10/2019 FINAL    Organism ID, Bacteria ESCHERICHIA COLI (A)       Susceptibility   Escherichia coli - MIC*    AMPICILLIN >=32 RESISTANT Resistant     CEFAZOLIN <=4 SENSITIVE Sensitive     CEFTRIAXONE <=1 SENSITIVE Sensitive     CIPROFLOXACIN <=0.25 SENSITIVE Sensitive     GENTAMICIN <=1 SENSITIVE Sensitive     IMIPENEM <=0.25 SENSITIVE Sensitive     NITROFURANTOIN <=16 SENSITIVE Sensitive     TRIMETH/SULFA <=20 SENSITIVE Sensitive     AMPICILLIN/SULBACTAM 16 INTERMEDIATE Intermediate     PIP/TAZO <=4 SENSITIVE Sensitive     * >=100,000 COLONIES/mL ESCHERICHIA COLI  Ammonia     Status: None   Collection Time: 07/07/19  3:17 PM  Result Value Ref Range   Ammonia 33 9 - 35 umol/L    Comment: Performed at [facility], [address], [place], [place] [address]  Blood gas, venous (at [facility] and [facility], not at [facility])     Status: Abnormal   Collection Time: 07/07/19  3:17 PM  Result Value Ref Range   FIO2 21.00    pH, Ven 7.384 7.25 - 7.43   pCO2, Ven 55.6 44 - 60 mmHg  pO2, Ven 39.4 32 - 45 mmHg   Bicarbonate 29.7 (H) 20.0 - 28.0 mmol/L   Acid-Base Excess 7.4 (H) 0.0 - 2.0 mmol/L   O2  Saturation 69.5 %   Patient temperature 36.6     Comment: Performed at [facility], [address], [place], [place] [address]  Basic metabolic panel     Status: Abnormal   Collection Time: 07/09/19  9:56 AM  Result Value Ref Range   Sodium 139 135 - 145 mEq/L   Potassium 4.0 3.5 - 5.1 mEq/L   Chloride 102 96 - 112 mEq/L   CO2 33 (H) 19 - 32 mEq/L   Glucose, Bld 116 (H) 70 - 99 mg/dL   BUN 14 6 - 23 mg/dL   Creatinine, Ser 0.88 0.40 - 1.20 mg/dL   GFR 64.81 >60.00 mL/min   Calcium 8.7 8.4 - 10.5 mg/dL    Assessment/Plan: 1. Visual hallucinations Completed course of antibiotic. Urine dip negative. Expect repeat urine culture to be negative as well. No change in hallucinations so cystitis clearly not the cause of current symptoms. Again CT in ER was unremarkable. Cymbalta decreased without improvement. Switching completely to Effexor as notes below due to worsening anxiety/depression. Has decent insight into her symptoms/illness. No commanding/threatening hallucinations. No SI/HI. Currently no harm to self or others. Needs further assessment. Neurology appt pending -- will try to expedite this. Referral to Psychiatry also placed. Strict ER precautions reviewed with patient and husband. Information for behavioral health hospital also given.   2. Anxiety and depression Deterioration in mood since decrease in Cymbalta. No change in hallucinations as noted above. Do not feel Cymbalta is the culprit of this. She also feels her Cymbalta has not helped her. As such we will work on switching medication. Will switch from Cymbalta to Effexor XR at equivocal dose. Close follow-up scheduled. Referral to psychiatry placed for further management.   3. Cystitis Has completed entire course of antibiotic. Repeat Urine dipstick negative. Will obtain repeat culture today.  - POCT Urinalysis Dipstick - Urine Culture   This visit occurred during the SARS-CoV-2 public health emergency.  Safety protocols were  in place, including screening questions prior to the visit, additional usage of staff PPE, and extensive cleaning of exam room while observing appropriate contact time as indicated for disinfecting solutions.     [name], PA-C

## 2019-07-28 ENCOUNTER — Other Ambulatory Visit: Payer: Self-pay | Admitting: Physician Assistant

## 2019-07-28 DIAGNOSIS — G894 Chronic pain syndrome: Secondary | ICD-10-CM

## 2019-07-29 LAB — URINE CULTURE
MICRO NUMBER:: 10648657
Result:: NO GROWTH
SPECIMEN QUALITY:: ADEQUATE

## 2019-08-03 ENCOUNTER — Telehealth: Payer: Self-pay | Admitting: Physician Assistant

## 2019-08-03 ENCOUNTER — Other Ambulatory Visit: Payer: Self-pay | Admitting: Physician Assistant

## 2019-08-03 DIAGNOSIS — L299 Pruritus, unspecified: Secondary | ICD-10-CM

## 2019-08-03 NOTE — Telephone Encounter (Signed)
Would have her increase to 2 tablets (75 mg) daily for now over the next week to see how she is tolerating since she has tolerated current dose well thus far. Would like to see her next week for follow-up -- can be via phone if she would like.

## 2019-08-03 NOTE — Telephone Encounter (Signed)
Hydroxyzine discontinued and is not on patient's current med list.

## 2019-08-03 NOTE — Telephone Encounter (Signed)
Pt called in stating that she is taking the Effexor XR it is helping some but not enough per the pt. Please advise

## 2019-08-04 NOTE — Telephone Encounter (Signed)
Left a detailed message on VM to increase dose of Effexor to 2 tabs of 75 mg daily for the next week and to schedule an phone visit appointment with PCP for follow up

## 2019-08-05 ENCOUNTER — Other Ambulatory Visit: Payer: Self-pay | Admitting: Physician Assistant

## 2019-08-05 ENCOUNTER — Other Ambulatory Visit: Payer: Self-pay

## 2019-08-05 ENCOUNTER — Ambulatory Visit: Payer: Medicare Other | Admitting: Adult Health

## 2019-08-05 ENCOUNTER — Encounter: Payer: Self-pay | Admitting: Adult Health

## 2019-08-05 DIAGNOSIS — J9611 Chronic respiratory failure with hypoxia: Secondary | ICD-10-CM | POA: Diagnosis not present

## 2019-08-05 DIAGNOSIS — E785 Hyperlipidemia, unspecified: Secondary | ICD-10-CM

## 2019-08-05 DIAGNOSIS — F17210 Nicotine dependence, cigarettes, uncomplicated: Secondary | ICD-10-CM | POA: Diagnosis not present

## 2019-08-05 DIAGNOSIS — J449 Chronic obstructive pulmonary disease, unspecified: Secondary | ICD-10-CM | POA: Diagnosis not present

## 2019-08-05 NOTE — Assessment & Plan Note (Signed)
Smoking cessation encouraged!

## 2019-08-05 NOTE — Assessment & Plan Note (Signed)
Moderate COPD with high symptom burden.  Patient is currently compensated on present regimen.  Smoking cessation is encouraged Plan Patient Instructions  Continue on Symbicort 2 puffs Twice daily  , rinse after use.  Continue on Sprivia 2 puffs daily . Rinse after use .  Continue on  Oxygen 2l/m At bedtime   Work on not smoking.  Follow up in 4  months with Dr. Melvyn Novas  Or Liane Tribbey NP  Please contact office for sooner follow up if symptoms do not improve or worsen or seek emergency care

## 2019-08-05 NOTE — Progress Notes (Signed)
@Patient  ID: [name], female    DOB: 05-20-1955, 64 y.o.   MRN: [id number]  Chief Complaint  Patient presents with  . Follow-up    COPD     Referring provider: Delorse Limber  HPI: 64 yo female active smoker seen for initial pulmonary consult February 2019 for shortness of breath found to have moderate COPD with reversibility.  Patient is oxygen dependent and on oxygen at 2 L at bedtime She is disabled from orthopedic issues Medical history significant for dyslipidemia hypertension and anemia  TEST/EVENTS :  Spirometry 2/19/2019FEV1 1.54 (66%) Ratio 75 with min curvature p am symb - Allergy profile 03/18/17 >Eos 0.3 / IgE 4 neg RAST  - PFT's 4/29/2019FEV1 1.60 (63 % ) ratio 67 p 14 % improvement from saba p sym/ spriva prior to study with DLCO 55 % corrects to 63 % for alv volume with EOS up to 0.8 HRCT chest 12/24/2017-no ILD, mild to moderate emphysema Alpha-1 December 05, 2017 normal, MM, 174   08/05/2019 Follow up : COPD and oxygen dependent respiratory failure Patient presents for a 55-month follow-up.  Patient says overall she is doing about the same with her breathing.  She gets winded with minimal activity.  She denies any flare of cough or wheezing.  She remains on Symbicort and Spiriva.  She is had no increased albuterol use.  Patient says she has been under a lot of stress.  She has a lot of family issues.  Her daughter recently passed away.  She is having trouble with her husband and adult son. She remains on oxygen 2 L at bedtime. She continues to smoke.  We discussed smoking cessation in detail.  Allergies  Allergen Reactions  . Nsaids Diarrhea  . Aleve [Naproxen Sodium] Other (See Comments)    Headache   . Codeine Nausea Only and Other (See Comments)    GI upset  . Penicillins Nausea Only and Other (See Comments)    GI upset   . Sulfonamide Derivatives Hives    Immunization History  Administered Date(s) Administered  .  Influenza,inj,Quad PF,6+ Mos 10/02/2018  . Influenza-Unspecified 01/16/2011, 11/26/2011, 12/22/2012, 11/17/2018  . Td 06/07/2005    Past Medical History:  Diagnosis Date  . Allergic rhinitis   . Anemia   . Anxiety   . Chicken pox   . Chronic back pain   . COPD (chronic obstructive pulmonary disease) ([place])   . Depression   . Essential hypertension   . GERD (gastroesophageal reflux disease)   . Headache    migraines  . History of gastritis    EGD 2015  . History of home oxygen therapy    2 liters at hs last 6 months  . Hyperlipidemia   . Hypothyroidism   . Migraines   . Osteoarthritis    oa  . Scoliosis     Tobacco History: Social History   Tobacco Use  Smoking Status Current Every Day Smoker  . Packs/day: 1.50  . Years: 46.00  . Pack years: 69.00  . Types: Cigarettes  Smokeless Tobacco Never Used   Ready to quit: No Counseling given: Yes   Outpatient Medications Prior to Visit  Medication Sig Dispense Refill  . albuterol (PROAIR HFA) 108 (90 Base) MCG/ACT inhaler 2 puffs every 4 hours as needed only  if your can't catch your breath (Patient taking differently: Inhale 2 puffs into the lungs every 4 (four) hours as needed for wheezing or shortness of breath. if your can't catch  your breath) 6.7 g 0  . ALPRAZolam (XANAX) 1 MG tablet Take 1 tablet (1 mg total) by mouth 3 (three) times daily as needed for anxiety. (Patient taking differently: Take 1 mg by mouth in the morning, at noon, in the evening, and at bedtime. ) 90 tablet 0  . budesonide-formoterol (SYMBICORT) 160-4.5 MCG/ACT inhaler Inhale 2 puffs into the lungs 2 (two) times daily. 3 Inhaler 0  . fluticasone (FLONASE) 50 MCG/ACT nasal spray Place 2 sprays into both nostrils daily.    . furosemide (LASIX) 20 MG tablet Take 1 tablet (20 mg total) by mouth daily as needed. 30 tablet 0  . gabapentin (NEURONTIN) 300 MG capsule Take 600 mg by mouth 3 (three) times daily.     Marland Kitchen glucose blood (ACCU-CHEK GUIDE) test  strip 1 each by Other route 3 (three) times daily as needed for other. as directed Dx E11.9 100 strip 11  . HYDROcodone-acetaminophen (NORCO) 10-325 MG tablet TAKE 1 TABLET BY MOUTH EVERY 8 HOURS AS NEEDED 90 tablet 0  . JANUVIA 25 MG tablet TAKE 1 TABLET BY MOUTH DAILY 30 tablet 1  . levothyroxine (SYNTHROID) 75 MCG tablet TAKE 1 TABLET BY MOUTH DAILY BEFORE BREAKFAST 90 tablet 1  . loratadine (CLARITIN) 10 MG tablet Take 10 mg by mouth daily.    . meloxicam (MOBIC) 15 MG tablet Take 15 mg by mouth daily.    . metFORMIN (GLUMETZA) 500 MG (MOD) 24 hr tablet Take 2 tablets (1,000 mg total) by mouth daily with breakfast. (Patient taking differently: Take 500 mg by mouth 2 (two) times daily with a meal. ) 180 tablet 1  . nystatin (MYCOSTATIN/NYSTOP) powder APPLY TO THE AFFECTED AREA(S) TWICE DAILY 15 g 0  . omeprazole (PRILOSEC) 40 MG capsule TAKE ONE CAPSULE BY MOUTH DAILY BEFORE BREAKFAST and TAKE ONE CAPSULE DAILY BEFORE DINNER 60 capsule 2  . ondansetron (ZOFRAN) 4 MG tablet Take 1 tablet (4 mg total) by mouth every 8 (eight) hours as needed for nausea or vomiting. 10 tablet 0  . OXYGEN Inhale 2 L into the lungs at bedtime.     . potassium chloride (KLOR-CON) 10 MEQ tablet Take 1 tablet (10 mEq total) by mouth at bedtime. 90 tablet 2  . Tiotropium Bromide Monohydrate (SPIRIVA RESPIMAT) 2.5 MCG/ACT AERS Inhale 2 puffs into the lungs daily. 12 g 0  . triamterene-hydrochlorothiazide (MAXZIDE-25) 37.5-25 MG tablet Take 1 tablet by mouth daily. 90 tablet 2  . venlafaxine XR (EFFEXOR XR) 37.5 MG 24 hr capsule Take 1 capsule (37.5 mg total) by mouth daily with breakfast. (Patient taking differently: Take 37.5 mg by mouth daily with breakfast. Pt takes two daily) 30 capsule 1  . atorvastatin (LIPITOR) 20 MG tablet Take 1 tablet (20 mg total) by mouth at bedtime. 90 tablet 2   No facility-administered medications prior to visit.     Review of Systems:   Constitutional:   No  weight loss, night sweats,   Fevers, chills,  +fatigue, or  lassitude.  HEENT:   No headaches,  Difficulty swallowing,  Tooth/dental problems, or  Sore throat,                No sneezing, itching, ear ache, nasal congestion, post nasal drip,   CV:  No chest pain,  Orthopnea, PND, swelling in lower extremities, anasarca, dizziness, palpitations, syncope.   GI  No heartburn, indigestion, abdominal pain, nausea, vomiting, diarrhea, change in bowel habits, loss of appetite, bloody stools.   Resp:.  No chest  wall deformity  Skin: no rash or lesions.  GU: no dysuria, change in color of urine, no urgency or frequency.  No flank pain, no hematuria   MS:  + joint pains    Physical Exam  BP 126/78 (BP Location: Left Arm, Cuff Size: Normal)   Pulse 92   Temp 98.3 F (36.8 C) (Oral)   Ht 5\' 2"  (1.575 m)   Wt 240 lb 9.6 oz (109.1 kg)   SpO2 93%   BMI 44.01 kg/m   GEN: A/Ox3; pleasant , NAD, wc    HEENT:  [place]/AT,  EACs-clear, TMs-wnl, NOSE-clear, THROAT-clear, no lesions, no postnasal drip or exudate noted.   NECK:  Supple w/ fair ROM; no JVD; normal carotid impulses w/o bruits; no thyromegaly or nodules palpated; no lymphadenopathy.    RESP  Clear  P & A; w/o, wheezes/ rales/ or rhonchi. no accessory muscle use, no dullness to percussion  CARD:  RRR, no m/r/g, tr  peripheral edema, pulses intact, no cyanosis or clubbing.  GI:   Soft & nt; nml bowel sounds; no organomegaly or masses detected.   Musco: Warm bil, no deformities or joint swelling noted.   Neuro: alert, no focal deficits noted.    Skin: Warm, no lesions or rashes    Lab Results:  CBC  BMET  Imaging:     PFT Results Latest Ref Rng & Units 05/26/2017  FVC-Pre L 2.23  FVC-Predicted Pre % 68  FVC-Post L 2.38  FVC-Predicted Post % 72  Pre FEV1/FVC % % 63  Post FEV1/FCV % % 67  FEV1-Pre L 1.40  FEV1-Predicted Pre % 55  FEV1-Post L 1.60  DLCO UNC% % 55  DLCO COR %Predicted % 63  TLC L 5.32  TLC % Predicted % 105  RV % Predicted  % 141    No results found for: NITRICOXIDE      Assessment & Plan:   COPD GOLD II still smoking  Moderate COPD with high symptom burden.  Patient is currently compensated on present regimen.  Smoking cessation is encouraged Plan Patient Instructions  Continue on Symbicort 2 puffs Twice daily  , rinse after use.  Continue on Sprivia 2 puffs daily . Rinse after use .  Continue on  Oxygen 2l/m At bedtime   Work on not smoking.  Follow up in 4  months with Dr. Melvyn Novas  Or Junah Yam NP  Please contact office for sooner follow up if symptoms do not improve or worsen or seek emergency care         Chronic respiratory failure with hypoxia ([place]) Continue on oxygen at bedtime  Cigarette smoker Smoking cessation encouraged     Rexene Edison, NP 08/05/2019

## 2019-08-05 NOTE — Telephone Encounter (Signed)
Alprazolam LFD 07/16/19 #90 with no refills, patient is 10 days early LOV 07/27/19 NOV none

## 2019-08-05 NOTE — Assessment & Plan Note (Signed)
Continue on oxygen at bedtime

## 2019-08-05 NOTE — Patient Instructions (Addendum)
Continue on Symbicort 2 puffs Twice daily  , rinse after use.  Continue on Sprivia 2 puffs daily . Rinse after use .  Continue on  Oxygen 2l/m At bedtime   Work on not smoking.  Follow up in 4  months with Dr. Melvyn Novas  Or Teri Legacy NP  Please contact office for sooner follow up if symptoms do not improve or worsen or seek emergency care

## 2019-08-06 ENCOUNTER — Telehealth: Payer: Self-pay | Admitting: Physician Assistant

## 2019-08-06 DIAGNOSIS — R609 Edema, unspecified: Secondary | ICD-10-CM

## 2019-08-06 MED ORDER — FUROSEMIDE 20 MG PO TABS: 20.0000 mg | ORAL_TABLET | Freq: Every day | ORAL | 0 refills | Status: DC | PRN

## 2019-08-06 MED ORDER — POTASSIUM CHLORIDE CRYS ER 10 MEQ PO TBCR: 10.0000 meq | EXTENDED_RELEASE_TABLET | Freq: Every day | ORAL | 0 refills | Status: DC

## 2019-08-06 NOTE — Telephone Encounter (Signed)
  LAST APPOINTMENT DATE: 07/06/2019  NEXT APPOINTMENT DATE:@7 /14/2021  MEDICATION:furosemide (LASIX) 20 MG tablet // potassium chloride (KLOR-CON) 10 MEQ tablet  PHARMACY: [place], [place]

## 2019-08-06 NOTE — Telephone Encounter (Signed)
Rx sent to the preferred patient pharmacy  

## 2019-08-06 NOTE — Telephone Encounter (Signed)
Patient is completely out

## 2019-08-08 ENCOUNTER — Other Ambulatory Visit: Payer: Self-pay | Admitting: Physician Assistant

## 2019-08-09 ENCOUNTER — Other Ambulatory Visit: Payer: Self-pay | Admitting: *Deleted

## 2019-08-09 NOTE — Patient Outreach (Signed)
[facility]) Care Management  08/09/2019  [name] 08-Nov-1955 [id number]  Care coordination call to [place] to collaborate with [name], Pharm D. Regarding status of chronic care management.  Dr. Leroy Sea reviewed pt chart and advised that he has not actually been able to see [name]. She has cancelled her appt but is scheduled to see him tomorrow.  Explained that NP had also received a referral however, it appears as though her needs were strictly related to her medications and compliance.  Requested that Dr. Leroy Sea assess pt tomorrow to see if she needs any nursing care management services. He agreed and will message me afterwards.  [name] is still pending for [facility] Care Management.  Eulah Pont. Myrtie Neither, MSN, [facility] Gerontological Nurse Practitioner [facility] Care Management [phone]

## 2019-08-10 ENCOUNTER — Ambulatory Visit: Payer: Medicare HMO | Admitting: Physician Assistant

## 2019-08-11 ENCOUNTER — Ambulatory Visit: Payer: Medicare HMO

## 2019-08-11 ENCOUNTER — Other Ambulatory Visit: Payer: Self-pay | Admitting: Physician Assistant

## 2019-08-11 DIAGNOSIS — E119 Type 2 diabetes mellitus without complications: Secondary | ICD-10-CM

## 2019-08-11 DIAGNOSIS — E785 Hyperlipidemia, unspecified: Secondary | ICD-10-CM

## 2019-08-11 DIAGNOSIS — K2100 Gastro-esophageal reflux disease with esophagitis, without bleeding: Secondary | ICD-10-CM

## 2019-08-11 DIAGNOSIS — I10 Essential (primary) hypertension: Secondary | ICD-10-CM

## 2019-08-11 NOTE — Progress Notes (Signed)
Chronic Care Management Pharmacy  Name: [name]  MRN: [id number] DOB: Nov 02, 1955  Chief Complaint/ HPI  [name],  64 y.o. , female presents for their Initial CCM visit with the clinical pharmacist via telephone due to COVID-19 Pandemic.   PCP : Brunetta Jeans, PA-C  States she spends most of her time watching TV in the living room. Ongoing discomfort due to pain in back and knees. Managed with meloxicam, hydrocodone, gabapentin, venlafaxine.   Acid reflux has been well controlled on omeprazole 40 mg twice daily.  Reports some diarrhea w/ metformin but tolerating overall. Take two 500 mg tablets at once every morning. 'if nerves torn up I go to the bathroom much more'. BG occasionally getting above 200, typically ~125 in AM.   Mother died at age 37. Is also morning the loss of daughter and father, reporting that she thinks about them every day. States that she spends a lot of time thinking about them each day. Also states not ever adjusting after leaving [place] as a child. Reports 4 previous marriages.   Describes previous visual hallucinations. Today reports that she recently saw her husband walking around the house with a new girlfirend and her children. She mentions seeing a new born baby on the couch. She feels her husband is Previous reports note healthy relationship between patient and husband.   Will be seeing PCP tomorrow 11am, psychiatry referral in place. Previously failed xanax dose reduction. May consider slow taper of gabapentin. Discussed toxicology, a1c, tsh, b12, RPR with PCP.  Encounter Diagnoses  Name Primary?  . Gastroesophageal reflux disease with esophagitis, unspecified whether hemorrhage Yes  . Hyperlipidemia, unspecified hyperlipidemia type   . HYPERTENSION, BENIGN   . Diabetes mellitus, new onset ([place])     Office Visits:  08/05/2019 (Tammy P, Pulmonology): smoking cessation,  Continue oxygen spiriva symbicort. Still smoking. 4 month f/u with Dr.  Melvyn Novas. 07/09/2019 (PCP): Xanax decrease from QID to TID. Cymbalta decreased from 60 mg daily to 30 mg daily x 7 days until f/u. RN to come to patient's home for med rec. 07/07/2019 (ED): Hallucinations, confusion. Generalized cerebral atrophy. No acute intracranial abnormality.  Patient Active Problem List   Diagnosis Date Noted  . Diabetes mellitus, new onset ([place]) 04/12/2019  . Tremor 11/05/2018  . Chronic respiratory failure with hypoxia ([place]) 01/12/2018  . GERD with esophagitis 11/26/2017  . Dysphagia 08/20/2017  . Chronic pain syndrome 08/04/2017  . Comorbid sleep-related hypoventilation 08/04/2017  . OSA (obstructive sleep apnea) 08/04/2017  . Hepatic steatosis 08/04/2017  . Polyp of ascending colon   . Diverticulosis of colon without hemorrhage   . Gastritis and gastroduodenitis   . Hypothyroidism 07/22/2017  . Anxiety and depression 07/22/2017  . Iron deficiency anemia 07/22/2017  . Pilar cyst 07/22/2017  . Morbid obesity due to excess calories ([place]) 03/19/2017  . Anemia 03/19/2017  . COPD GOLD II still smoking  03/18/2017  . Degenerative lumbar spinal stenosis 02/17/2017  . S/P right hip revision 09/02/2016  . S/P hip replacement 09/02/2016  . Instability of prosthetic hip ([place]) 03/01/2016  . Dislocation of hip prosthesis ([place]) 02/17/2016  . Abnormal nuclear stress test: Intermediate Risk 10/31/2015  . Atypical angina ([place]) 10/31/2015  . Trigger finger, acquired 08/20/2012  . Cigarette smoker 12/12/2008  . Hyperlipidemia 11/17/2008  . HYPERTENSION, BENIGN 11/16/2008  . GEN OSTEOARTHROSIS INVOLVING MULTIPLE SITES 11/16/2008   Past Surgical History:  Procedure Laterality Date  . APPENDECTOMY     1985  . BIOPSY  07/24/2017   Procedure: BIOPSY;  Surgeon: Milus Banister, MD;  Location: [facility];  Service: Endoscopy;;  . CARDIAC CATHETERIZATION N/A 10/31/2015   Procedure: Left Heart Cath and Coronary Angiography;  Surgeon: Leonie Man, MD;  Location: [facility];  Service: Cardiovascular;  Laterality: N/A;  . CARPAL TUNNEL RELEASE Left   . CARPAL TUNNEL RELEASE Right   . CHOLECYSTECTOMY  late 1980's  . COLONOSCOPY WITH PROPOFOL N/A 07/24/2017   Procedure: COLONOSCOPY WITH PROPOFOL;  Surgeon: Milus Banister, MD;  [facility]: [facility];  Service: Endoscopy;  Laterality: N/A;  . ESOPHAGOGASTRODUODENOSCOPY N/A 07/24/2017   Procedure: ESOPHAGOGASTRODUODENOSCOPY (EGD);  Surgeon: Milus Banister, MD;  Location: [facility];  Service: Endoscopy;  Laterality: N/A;  . GALLBLADDER SURGERY  1991  . HIP CLOSED REDUCTION Right 01/08/2016   Procedure: CLOSED MANIPULATION HIP;  Surgeon: Susa Day, MD;  Location: [facility];  Service: Orthopedics;  Laterality: Right;  . HIP CLOSED REDUCTION Right 01/19/2016   Procedure: ATTEMPTED CLOSED REDUCTION RIGHT HIP;  Surgeon: Wylene Simmer, MD;  [facility]: [facility];  Service: Orthopedics;  Laterality: Right;  . HIP CLOSED REDUCTION Right 01/20/2016   Procedure: CLOSED REDUCTION RIGHT TOTAL HIP;  Surgeon: Paralee Cancel, MD;  Location: [facility];  Service: Orthopedics;  Laterality: Right;  . HIP CLOSED REDUCTION Right 02/17/2016   Procedure: CLOSED REDUCTION RIGHT TOTAL HIP;  Surgeon: Rod Can, MD;  Location: [place];  Service: Orthopedics;  Laterality: Right;  . HIP CLOSED REDUCTION Right 02/28/2016   Procedure: CLOSED REDUCTION HIP;  Surgeon: Nicholes Stairs, MD;  [facility];  Service: Orthopedics;  Laterality: Right;  . POLYPECTOMY  07/24/2017   Procedure: POLYPECTOMY;  Surgeon: Milus Banister, MD;  [facility];  Service: Endoscopy;;  . TONSILLECTOMY    . TOTAL ABDOMINAL HYSTERECTOMY     1985, with 1 ovary removed and 2 nd ovary removed 2003  . TOTAL HIP ARTHROPLASTY Right    Original surgery 2006 with revision 2010  . TOTAL HIP REVISION Right 01/01/2016   Procedure: TOTAL HIP REVISION;  Surgeon: Paralee Cancel, MD;  [facility];  Service: Orthopedics;  Laterality: Right;  . TOTAL HIP REVISION  Right 03/02/2016   Procedure: TOTAL HIP REVISION;  Surgeon: Paralee Cancel, MD;  [facility];  Service: Orthopedics;  Laterality: Right;  . TOTAL HIP REVISION Right 09/02/2016   Procedure: Right hip constrained liner- posterior;  Surgeon: Paralee Cancel, MD;  Location[facility];  Service: Orthopedics;  Laterality: Right;  . ULNAR NERVE TRANSPOSITION Right    Social History   Socioeconomic History  . Marital status: Married    Spouse name: Not on file  . Number of children: Not on file  . Years of education: Not on file  . Highest education level: Not on file  Occupational History  . Not on file  Tobacco Use  . Smoking status: Current Every Day Smoker    Packs/day: 1.50    Years: 46.00    Pack years: 69.00    Types: Cigarettes  . Smokeless tobacco: Never Used  Vaping Use  . Vaping Use: Never used  Substance and Sexual Activity  . Alcohol use: No  . Drug use: No  . Sexual activity: Yes  Other Topics Concern  . Not on file  Social History Narrative   Right handed    Caffeine~ 2 cups per day    Lives at home with husband (strained relationship)   Primary caretaker for disabled brother who had  aneurism   Daughter died May 2020    Social Determinants of Health   Financial Resource Strain:   . Difficulty of Paying Living Expenses:   Food Insecurity:   . Worried About Charity fundraiser in the Last Year:   . Arboriculturist in the Last Year:   Transportation Needs:   . Film/video editor (Medical):   Marland Kitchen Lack of Transportation (Non-Medical):   Physical Activity:   . Days of Exercise per Week:   . Minutes of Exercise per Session:   Stress:   . Feeling of Stress :   Social Connections:   . Frequency of Communication with Friends and Family:   . Frequency of Social Gatherings with Friends and Family:   . Attends Religious Services:   . Active Member of Clubs or Organizations:   . Attends Archivist Meetings:   Marland Kitchen Marital Status:    Family History  Problem  Relation Age of Onset  . COPD Mother   . Heart disease Mother   . Lung disease Father        Asbestosis  . Heart attack Father   . Heart disease Father   . Cerebral aneurysm Brother   . Aneurysm Brother        Brain  . Epilepsy Son   . Arthritis Maternal Grandmother   . Heart disease Maternal Grandmother   . Asthma Maternal Grandfather   . Cancer Maternal Grandfather   . Arthritis Paternal Grandmother   . Heart disease Paternal Grandmother   . Stroke Paternal Grandmother   . Early death Paternal Grandfather   . Heart disease Paternal Grandfather    Allergies  Allergen Reactions  . Nsaids Diarrhea  . Aleve [Naproxen Sodium] Other (See Comments)    Headache   . Codeine Nausea Only and Other (See Comments)    GI upset  . Penicillins Nausea Only and Other (See Comments)    GI upset   . Sulfonamide Derivatives Hives   Outpatient Encounter Medications as of 08/11/2019  Medication Sig Note  . albuterol (PROAIR HFA) 108 (90 Base) MCG/ACT inhaler 2 puffs every 4 hours as needed only  if your can't catch your breath (Patient taking differently: Inhale 2 puffs into the lungs every 4 (four) hours as needed for wheezing or shortness of breath. if your can't catch your breath)   . ALPRAZolam (XANAX) 1 MG tablet Take 1 tablet (1 mg total) by mouth 3 (three) times daily as needed for anxiety. (Patient taking differently: Take 1 mg by mouth in the morning, at noon, in the evening, and at bedtime. )   . atorvastatin (LIPITOR) 20 MG tablet Take 20 mg by mouth daily.   . budesonide-formoterol (SYMBICORT) 160-4.5 MCG/ACT inhaler Inhale 2 puffs into the lungs 2 (two) times daily.   . fluticasone (FLONASE) 50 MCG/ACT nasal spray Place 2 sprays into both nostrils daily.   . furosemide (LASIX) 20 MG tablet Take 1 tablet (20 mg total) by mouth daily as needed.   . gabapentin (NEURONTIN) 300 MG capsule Take 600 mg by mouth 3 (three) times daily.  12/16/2018: Possibly causing tremors   . glucose blood  (ACCU-CHEK GUIDE) test strip 1 each by Other route 3 (three) times daily as needed for other. as directed Dx E11.9   . HYDROcodone-acetaminophen (NORCO) 10-325 MG tablet TAKE 1 TABLET BY MOUTH EVERY 8 HOURS AS NEEDED   . levothyroxine (SYNTHROID) 75 MCG tablet TAKE 1 TABLET BY MOUTH DAILY BEFORE BREAKFAST   .  loratadine (CLARITIN) 10 MG tablet Take 10 mg by mouth daily.   . meloxicam (MOBIC) 15 MG tablet Take 15 mg by mouth daily.   . metFORMIN (GLUMETZA) 500 MG (MOD) 24 hr tablet Take 2 tablets (1,000 mg total) by mouth daily with breakfast.   . omeprazole (PRILOSEC) 40 MG capsule TAKE ONE CAPSULE BY MOUTH DAILY BEFORE BREAKFAST and TAKE ONE CAPSULE DAILY BEFORE DINNER   . OXYGEN Inhale 2 L into the lungs at bedtime.    . potassium chloride (KLOR-CON) 10 MEQ tablet Take 1 tablet (10 mEq total) by mouth at bedtime.   . Tiotropium Bromide Monohydrate (SPIRIVA RESPIMAT) 2.5 MCG/ACT AERS Inhale 2 puffs into the lungs daily.   Marland Kitchen triamterene-hydrochlorothiazide (MAXZIDE-25) 37.5-25 MG tablet Take 1 tablet by mouth daily.   Marland Kitchen venlafaxine XR (EFFEXOR XR) 37.5 MG 24 hr capsule Take 1 capsule (37.5 mg total) by mouth daily with breakfast.   . JANUVIA 25 MG tablet TAKE 1 TABLET BY MOUTH DAILY   . nystatin (MYCOSTATIN/NYSTOP) powder APPLY TO THE AFFECTED AREA(S) TWICE DAILY   . ondansetron (ZOFRAN) 4 MG tablet Take 1 tablet (4 mg total) by mouth every 8 (eight) hours as needed for nausea or vomiting.    No facility-administered encounter medications on file as of 08/11/2019.   Patient Care Team    Relationship Specialty Notifications Start End  Brunetta Jeans, [place] PCP - General Family Medicine  07/18/17   Milus Banister, MD Attending Physician Gastroenterology  07/18/17   Tanda Rockers, MD Consulting Physician Pulmonary Disease  07/18/17   Dr. Retta Mac    07/18/17    Comment: Dentist  Paralee Cancel, MD Consulting Physician Orthopedic Surgery  07/18/17   Latanya Maudlin, MD Consulting Physician  Orthopedic Surgery  07/18/17   Kathrynn Ducking, MD Consulting Physician Neurology  12/16/18   Okey Regal, [place] Physician Optometry  12/16/18    Comment: Richarda Osmond, [facility] Pharmacist Pharmacist  05/24/19    Comment: PHONE NUMBER [phone]  Deloria Lair, [place] Management  Admissions 07/09/19    Current Diagnosis/Assessment: Goals Addressed            This Visit's Progress   . PharmD Care Plan       CARE PLAN ENTRY (see longitudinal plan of care for additional care plan information)  Current Barriers:  . Chronic Disease Management support, education, and care coordination needs related to Hypertension, Hyperlipidemia, Diabetes, and GERD   Hypertension BP Readings from Last 3 Encounters:  08/05/19 126/78  07/27/19 132/76  07/09/19 130/82   . Pharmacist Clinical Goal(s): o Over the next 180 days, patient will work with PharmD and providers to maintain BP goal <140/90 . Current regimen:  o Triamterence-hydrochlorothiazide 37.5-25 mg once daily . Interventions: o Continue current management . Patient self care activities - Over the next 180 days, patient will: o Check BP once every 1-2 weeks, document, and provide at future appointments o Ensure daily salt intake < 2300 mg/day  Hyperlipidemia Lab Results  Component Value Date/Time   LDLCALC 61 11/04/2018 03:22 PM   LDLDIRECT 72.0 08/31/2018 02:11 PM   . Pharmacist Clinical Goal(s): o Over the next 180 days, patient will work with PharmD and providers to maintain LDL goal < 100 . Current regimen:  o Atorvastatin 20 mg daily  . Interventions: o Continue current management . Patient self care activities - Over the next 180 days, patient will: o Continue current management  Diabetes Lab Results  Component Value Date/Time   HGBA1C 9.3 (A) 03/22/2019 02:44 PM   . Pharmacist Clinical Goal(s): o Over the next 180 days, patient will work with PharmD and providers to  achieve A1c goal <7% . Current regimen:  o Metformin 500 mg tablet - take two tablets every morning with breakfast . Interventions: o Continue current management o Limit carbohydrates in diet . Patient self care activities - Over the next 180 days, patient will: o Check blood sugar once daily, document, and provide at future appointments o Contact provider with any episodes of hypoglycemia  GERD . Pharmacist Clinical Goal(s) o Over the next 180 days, patient will work with PharmD and providers to minimize GERD related symptoms . Current regimen:  o Omeprazole 40 mg twice daily  . Interventions: o Continue current management . Patient self care activities - Over the next 180 days, patient will: o Continue current management  Medication management . Pharmacist Clinical Goal(s): o Over the next 180 days, patient will work with PharmD and providers to maintain optimal medication adherence . Current pharmacy: [facility] . Interventions o Comprehensive medication review performed. o Continue current medication management strategy . Patient self care activities - Over the next 180 days, patient will: o Take medications as prescribed o Report any questions or concerns to PharmD and/or provider(s)  Initial goal documentation       Hypertension   BP Readings from Last 3 Encounters:  08/12/19 110/70  08/05/19 126/78  07/27/19 132/76   BP controlled at recent visits. Denies dizziness, SOB, chest pain. Patient checks BP at home infrequently. Patient is currently controlled on the following medications:  . triamterence-hydrochlorothiazide 37.5-25 mg once daily  Plan  Continue current medications    Diabetes   Recent Relevant Labs:  Lab Results  Component Value Date/Time   HGBA1C 9.3 (A) 03/22/2019 02:44 PM   EGFR 60 (L) 06/30/2014 02:48 PM   MICROALBUR 0.4 03/12/2019 01:54 PM    Checking BG: Daily. fasting has been ~125 on average, occasionally >200. Was  previously holding metformin.  Patient is currently uncontrolled on the following medications:  Marland Kitchen Metformin 500 mg tablet - take 2 tablets by mouth daily with a meal  We discussed: dietary recommendations.  Plan  Continue current medications   COPD   Gold Grade: Gold 2 (FEV1 50-79%).   Lab Results  Component Value Date/Time   EOSPCT 3.1 03/12/2019 01:54 PM   EOSPCT 8.6 (H) 06/30/2014 02:48 PM   Lab Results  Component Value Date/Time   EOSABS 285 03/12/2019 01:54 PM   EOSABS 0.7 (H) 06/30/2014 02:48 PM   Patient is currently taking the following medications:   Tiotropium Bromide Monohydrate (SPIRIVA RESPIMAT) 2.5 MCG/ACT AERS 2 puffs daily   Budesonide-formoterol (SYMBICORT) 160-4.5 MCG/ACT inhale 2 puffs into the lungs two times daily - rinse after use   Albuterol rescue inhaler 2 puffs every 4 hours as needed  Plan  Continue current medications. Follow-up to discuss smoking cessation next month.  Hyperlipidemia   Lipid Panel     Component Value Date/Time   CHOL 135 11/04/2018 1522   TRIG 194.0 (H) 11/04/2018 1522   HDL 34.80 (L) 11/04/2018 1522   LDLCALC 61 11/04/2018 1522   LDLDIRECT 72.0 08/31/2018 1411    The 10-year ASCVD risk score Mikey Bussing DC Jr., et al., 2013) is: 15.9%   Values used to calculate the score:     Age: 66 years     Sex: Female  Is Non-Hispanic African American: No     Diabetic: Yes     Tobacco smoker: Yes     Systolic Blood Pressure: 884 mmHg     Is BP treated: Yes     HDL Cholesterol: 34.8 mg/dL     Total Cholesterol: 135 mg/dL   Denies any muscle or abdominal pain or n/v. Patient is currently controlled on the following medications:  . Atorvastatin 20 mg daily  Plan  Continue current medications.  Hypothyroidism   Lab Results  Component Value Date/Time   TSH 4.89 (H) 03/12/2019 01:54 PM   TSH 6.22 (H) 02/18/2019 11:15 AM   TSH 4.40 11/10/2018 03:16 PM   TSH slightly elevated 03/2019. Patient is currently near goal on  the following medications:  . Levothyroxine 75 mcg daily  Plan  Continue current medications.  GERD   Patient denies dysphagia, heartburn or nausea. Currently controlled on: . Omeprazole 40 mg twice daily   Plan   Continue current medication.  Medication Management   I performed an extensive review of her medications.   Receives prescription medications from:  [place] [place] [address] [place] [address] Phone: [phone] Fax: [phone]  [facility]    Denies any issues with current medication management.   Plan  Continue current medication management strategy.  Follow up: 1 month phone visit. Need to discuss COPD, tobacco cessation, anxiety/depression ______________ Visit Information SDOH (Social Determinants of Health) assessments performed: Yes.  Ms. Rottmann was given information about Chronic Care Management services today including:  1. CCM service includes personalized support from designated clinical staff supervised by her physician, including individualized plan of care and coordination with other care providers 2. 24/7 contact phone numbers for assistance for urgent and routine care needs. 3. Standard insurance, coinsurance, copays and deductibles apply for chronic care management only during months in which we provide at least 20 minutes of these services. Most insurances cover these services at 100%, however patients may be responsible for any copay, coinsurance and/or deductible if applicable. This service may help you avoid the need for more expensive face-to-face services. 4. Only one practitioner may furnish and bill the service in a calendar month. 5. The patient may stop CCM services at any time (effective at the end of the month) by phone call to the office staff.  Patient agreed to services and verbal consent obtained.   [name], Pharm.D., BCGP Clinical Pharmacist [facility] [phone]

## 2019-08-12 ENCOUNTER — Other Ambulatory Visit: Payer: Self-pay

## 2019-08-12 ENCOUNTER — Other Ambulatory Visit: Payer: Self-pay | Admitting: Internal Medicine

## 2019-08-12 ENCOUNTER — Ambulatory Visit (INDEPENDENT_AMBULATORY_CARE_PROVIDER_SITE_OTHER): Payer: Medicare HMO | Admitting: Physician Assistant

## 2019-08-12 ENCOUNTER — Telehealth: Payer: Self-pay | Admitting: Internal Medicine

## 2019-08-12 ENCOUNTER — Encounter: Payer: Self-pay | Admitting: Physician Assistant

## 2019-08-12 VITALS — BP 110/70 | HR 92 | Temp 98.3°F | Resp 16 | Ht 62.0 in | Wt 232.0 lb

## 2019-08-12 DIAGNOSIS — F329 Major depressive disorder, single episode, unspecified: Secondary | ICD-10-CM

## 2019-08-12 DIAGNOSIS — G894 Chronic pain syndrome: Secondary | ICD-10-CM | POA: Diagnosis not present

## 2019-08-12 DIAGNOSIS — F22 Delusional disorders: Secondary | ICD-10-CM

## 2019-08-12 DIAGNOSIS — F419 Anxiety disorder, unspecified: Secondary | ICD-10-CM | POA: Diagnosis not present

## 2019-08-12 DIAGNOSIS — R443 Hallucinations, unspecified: Secondary | ICD-10-CM | POA: Diagnosis not present

## 2019-08-12 DIAGNOSIS — F32A Depression, unspecified: Secondary | ICD-10-CM

## 2019-08-12 LAB — COMPREHENSIVE METABOLIC PANEL
ALT: 42 U/L — ABNORMAL HIGH (ref 0–35)
AST: 44 U/L — ABNORMAL HIGH (ref 0–37)
Albumin: 3.7 g/dL (ref 3.5–5.2)
Alkaline Phosphatase: 107 U/L (ref 39–117)
BUN: 12 mg/dL (ref 6–23)
CO2: 27 mEq/L (ref 19–32)
Calcium: 8.6 mg/dL (ref 8.4–10.5)
Chloride: 101 mEq/L (ref 96–112)
Creatinine, Ser: 0.89 mg/dL (ref 0.40–1.20)
GFR: 63.95 mL/min (ref >60.00)
Glucose, Bld: 204 mg/dL — ABNORMAL HIGH (ref 70–99)
Potassium: 4.4 mEq/L (ref 3.5–5.1)
Sodium: 137 mEq/L (ref 135–145)
Total Bilirubin: 0.5 mg/dL (ref 0.2–1.2)
Total Protein: 6.5 g/dL (ref 6.0–8.3)

## 2019-08-12 LAB — TSH: TSH: 5.85 u[IU]/mL — ABNORMAL HIGH (ref 0.35–4.50)

## 2019-08-12 LAB — HEMOGLOBIN A1C: Hgb A1c MFr Bld: 7 % — ABNORMAL HIGH (ref 4.6–6.5)

## 2019-08-12 LAB — VITAMIN B12: Vitamin B-12: 440 pg/mL (ref 211–911)

## 2019-08-12 MED ORDER — ALPRAZOLAM 1 MG PO TABS: 1.0000 mg | ORAL_TABLET | Freq: Four times a day (QID) | ORAL | 0 refills | Status: DC

## 2019-08-12 MED ORDER — MELOXICAM 7.5 MG PO TABS
7.5000 mg | ORAL_TABLET | Freq: Every day | ORAL | 0 refills | Status: DC
Start: 2019-08-12 — End: 2019-08-12

## 2019-08-12 MED ORDER — VENLAFAXINE HCL ER 75 MG PO CP24: 75.0000 mg | ORAL_CAPSULE | Freq: Every day | ORAL | 1 refills | Status: DC

## 2019-08-12 MED ORDER — MELOXICAM 7.5 MG PO TABS: 7.5000 mg | ORAL_TABLET | Freq: Every day | ORAL | 0 refills | Status: DC

## 2019-08-12 NOTE — Patient Instructions (Addendum)
Please call me at [phone] (direct line) with any questions - thank you!  - Kendra Gunnels., Clinical Pharmacist  Goals Addressed            This Visit's Progress   . PharmD Care Plan       CARE PLAN ENTRY (see longitudinal plan of care for additional care plan information)  Current Barriers:  . Chronic Disease Management support, education, and care coordination needs related to Hypertension, Hyperlipidemia, Diabetes, and GERD   Hypertension BP Readings from Last 3 Encounters:  08/05/19 126/78  07/27/19 132/76  07/09/19 130/82   . Pharmacist Clinical Goal(s): o Over the next 180 days, patient will work with PharmD and providers to maintain BP goal <140/90 . Current regimen:  o Triamterence-hydrochlorothiazide 37.5-25 mg once daily . Interventions: o Continue current management . Patient self care activities - Over the next 180 days, patient will: o Check BP once every 1-2 weeks, document, and provide at future appointments o Ensure daily salt intake < 2300 mg/day  Hyperlipidemia Lab Results  Component Value Date/Time   LDLCALC 61 11/04/2018 03:22 PM   LDLDIRECT 72.0 08/31/2018 02:11 PM   . Pharmacist Clinical Goal(s): o Over the next 180 days, patient will work with PharmD and providers to maintain LDL goal < 100 . Current regimen:  o Atorvastatin 20 mg daily  . Interventions: o Continue current management . Patient self care activities - Over the next 180 days, patient will: o Continue current management  Diabetes Lab Results  Component Value Date/Time   HGBA1C 9.3 (A) 03/22/2019 02:44 PM   . Pharmacist Clinical Goal(s): o Over the next 180 days, patient will work with PharmD and providers to achieve A1c goal <7% . Current regimen:  o Metformin 500 mg tablet - take two tablets every morning with breakfast . Interventions: o Continue current management o Limit carbohydrates in diet . Patient self care activities - Over the next 180 days, patient will: o Check  blood sugar once daily, document, and provide at future appointments o Contact provider with any episodes of hypoglycemia  GERD . Pharmacist Clinical Goal(s) o Over the next 180 days, patient will work with PharmD and providers to minimize GERD related symptoms . Current regimen:  o Omeprazole 40 mg twice daily  . Interventions: o Continue current management . Patient self care activities - Over the next 180 days, patient will: o Continue current management  Medication management . Pharmacist Clinical Goal(s): o Over the next 180 days, patient will work with PharmD and providers to maintain optimal medication adherence . Current pharmacy: [facility], [facility] . Interventions o Comprehensive medication review performed. o Continue current medication management strategy . Patient self care activities - Over the next 180 days, patient will: o Take medications as prescribed o Report any questions or concerns to PharmD and/or provider(s)  Initial goal documentation       [name] was given information about Chronic Care Management services today including:  1. CCM service includes personalized support from designated clinical staff supervised by her physician, including individualized plan of care and coordination with other care providers 2. 24/7 contact phone numbers for assistance for urgent and routine care needs. 3. Standard insurance, coinsurance, copays and deductibles apply for chronic care management only during months in which we provide at least 20 minutes of these services. Most insurances cover these services at 100%, however patients may be responsible for any copay, coinsurance and/or deductible if applicable. This service may help you avoid the  need for more expensive face-to-face services. 4. Only one practitioner may furnish and bill the service in a calendar month. 5. The patient may stop CCM services at any time (effective at the end of the month) by phone call to the  office staff.  Patient agreed to services and verbal consent obtained.   The patient verbalized understanding of instructions provided today and agreed to receive a mailed copy of patient instruction and/or educational materials. Telephone follow up appointment with pharmacy team member scheduled for: See next appointment with "Care Management Staff" under "What's Next" below.   Thank you!  [name], Pharm.D., BCGP Clinical Pharmacist [facility] [phone]   Diabetes Mellitus and Nutrition, Adult When you have diabetes (diabetes mellitus), it is very important to have healthy eating habits because your blood sugar (glucose) levels are greatly affected by what you eat and drink. Eating healthy foods in the appropriate amounts, at about the same times every day, can help you:  Control your blood glucose.  Lower your risk of heart disease.  Improve your blood pressure.  Reach or maintain a healthy weight. Every person with diabetes is different, and each person has different needs for a meal plan. Your health care provider may recommend that you work with a diet and nutrition specialist (dietitian) to make a meal plan that is best for you. Your meal plan may vary depending on factors such as:  The calories you need.  The medicines you take.  Your weight.  Your blood glucose, blood pressure, and cholesterol levels.  Your activity level.  Other health conditions you have, such as heart or kidney disease. How do carbohydrates affect me? Carbohydrates, also called carbs, affect your blood glucose level more than any other type of food. Eating carbs naturally raises the amount of glucose in your blood. Carb counting is a method for keeping track of how many carbs you eat. Counting carbs is important to keep your blood glucose at a healthy level, especially if you use insulin or take certain oral diabetes medicines. It is important to know how  many carbs you can safely have in each meal. This is different for every person. Your dietitian can help you calculate how many carbs you should have at each meal and for each snack. Foods that contain carbs include:  Bread, cereal, rice, pasta, and crackers.  Potatoes and corn.  Peas, beans, and lentils.  Milk and yogurt.  Fruit and juice.  Desserts, such as cakes, cookies, ice cream, and candy. How does alcohol affect me? Alcohol can cause a sudden decrease in blood glucose (hypoglycemia), especially if you use insulin or take certain oral diabetes medicines. Hypoglycemia can be a life-threatening condition. Symptoms of hypoglycemia (sleepiness, dizziness, and confusion) are similar to symptoms of having too much alcohol. If your health care provider says that alcohol is safe for you, follow these guidelines:  Limit alcohol intake to no more than 1 drink per day for nonpregnant women and 2 drinks per day for men. One drink equals 12 oz of beer, 5 oz of wine, or 1 oz of hard liquor.  Do not drink on an empty stomach.  Keep yourself hydrated with water, diet soda, or unsweetened iced tea.  Keep in mind that regular soda, juice, and other mixers may contain a lot of sugar and must be counted as carbs. What are tips for following this plan?  Reading food labels  Start by checking the serving size on the "  Nutrition Facts" label of packaged foods and drinks. The amount of calories, carbs, fats, and other nutrients listed on the label is based on one serving of the item. Many items contain more than one serving per package.  Check the total grams (g) of carbs in one serving. You can calculate the number of servings of carbs in one serving by dividing the total carbs by 15. For example, if a food has 30 g of total carbs, it would be equal to 2 servings of carbs.  Check the number of grams (g) of saturated and trans fats in one serving. Choose foods that have low or no amount of these  fats.  Check the number of milligrams (mg) of salt (sodium) in one serving. Most people should limit total sodium intake to less than 2,300 mg per day.  Always check the nutrition information of foods labeled as "low-fat" or "nonfat". These foods may be higher in added sugar or refined carbs and should be avoided.  Talk to your dietitian to identify your daily goals for nutrients listed on the label. Shopping  Avoid buying canned, premade, or processed foods. These foods tend to be high in fat, sodium, and added sugar.  Shop around the outside edge of the grocery store. This includes fresh fruits and vegetables, bulk grains, fresh meats, and fresh dairy. Cooking  Use low-heat cooking methods, such as baking, instead of high-heat cooking methods like deep frying.  Cook using healthy oils, such as olive, canola, or sunflower oil.  Avoid cooking with butter, cream, or high-fat meats. Meal planning  Eat meals and snacks regularly, preferably at the same times every day. Avoid going long periods of time without eating.  Eat foods high in fiber, such as fresh fruits, vegetables, beans, and whole grains. Talk to your dietitian about how many servings of carbs you can eat at each meal.  Eat 4-6 ounces (oz) of lean protein each day, such as lean meat, chicken, fish, eggs, or tofu. One oz of lean protein is equal to: ? 1 oz of meat, chicken, or fish. ? 1 egg. ?  cup of tofu.  Eat some foods each day that contain healthy fats, such as avocado, nuts, seeds, and fish. Lifestyle  Check your blood glucose regularly.  Exercise regularly as told by your health care provider. This may include: ? 150 minutes of moderate-intensity or vigorous-intensity exercise each week. This could be brisk walking, biking, or water aerobics. ? Stretching and doing strength exercises, such as yoga or weightlifting, at least 2 times a week.  Take medicines as told by your health care provider.  Do not use any  products that contain nicotine or tobacco, such as cigarettes and e-cigarettes. If you need help quitting, ask your health care provider.  Work with a Social worker or diabetes educator to identify strategies to manage stress and any emotional and social challenges. Questions to ask a health care provider  Do I need to meet with a diabetes educator?  Do I need to meet with a dietitian?  What number can I call if I have questions?  When are the best times to check my blood glucose? Where to find more information:  American Diabetes Association: diabetes.org  Academy of Nutrition and Dietetics: www.eatright.[employer] of Diabetes and Digestive and Kidney Diseases (NIH): [employer] Summary  A healthy meal plan will help you control your blood glucose and maintain a healthy lifestyle.  Working with a diet and nutrition specialist (dietitian) can help  you make a meal plan that is best for you.  Keep in mind that carbohydrates (carbs) and alcohol have immediate effects on your blood glucose levels. It is important to count carbs and to use alcohol carefully. This information is not intended to replace advice given to you by your health care provider. Make sure you discuss any questions you have with your health care provider. Document Revised: 12/27/2016 Document Reviewed: 02/19/2016 Elsevier Patient Education  2020 [employer].

## 2019-08-12 NOTE — Progress Notes (Signed)
Patient presents to clinic today for follow-up of anxiety/depression and hallucinations. At last visit, Cymbalta was switched to Effexor XR as it was questioned if the Cymbalta was contributing to her hallucinations and it was noted it was not helping her pain levels. She notes taking as directed and tolerating well, noting an improvement in her mood overall compared to the Cymbalta. Notes the past few days have been rougher. States she has been more down and anxious but is dealing with a lot of stressors. States that she feels the hallucinations have been better but notes that there is a woman with a baby that keeps getting into their house and she is not sure why. Notes that her husband and grandson note there is no one in the house. Again notes these "people" do not communicate with her. Notes sleeping well overall. Seems she has had a hard time cutting back the frequency of her Xanax due to worsening anxiety and so she has been taking 4 x daily again. Denies thoughts of harming herself or others. Husband spoken to separately notes he feels safe at home. Is just worried about her.   Past Medical History:  Diagnosis Date  . Allergic rhinitis   . Anemia   . Anxiety   . Chicken pox   . Chronic back pain   . COPD (chronic obstructive pulmonary disease) ([place])   . Depression   . Essential hypertension   . GERD (gastroesophageal reflux disease)   . Headache    migraines  . History of gastritis    EGD 2015  . History of home oxygen therapy    2 liters at hs last 6 months  . Hyperlipidemia   . Hypothyroidism   . Migraines   . Osteoarthritis    oa  . Scoliosis     Current Outpatient Medications on File Prior to Visit  Medication Sig Dispense Refill  . albuterol (PROAIR HFA) 108 (90 Base) MCG/ACT inhaler 2 puffs every 4 hours as needed only  if your can't catch your breath (Patient taking differently: Inhale 2 puffs into the lungs every 4 (four) hours as needed for wheezing or shortness of  breath. if your can't catch your breath) 6.7 g 0  . ALPRAZolam (XANAX) 1 MG tablet Take 1 tablet (1 mg total) by mouth 3 (three) times daily as needed for anxiety. (Patient taking differently: Take 1 mg by mouth in the morning, at noon, in the evening, and at bedtime. ) 90 tablet 0  . atorvastatin (LIPITOR) 20 MG tablet Take 20 mg by mouth daily.    . budesonide-formoterol (SYMBICORT) 160-4.5 MCG/ACT inhaler Inhale 2 puffs into the lungs 2 (two) times daily. 3 Inhaler 0  . fluticasone (FLONASE) 50 MCG/ACT nasal spray Place 2 sprays into both nostrils daily.    . furosemide (LASIX) 20 MG tablet Take 1 tablet (20 mg total) by mouth daily as needed. 90 tablet 0  . gabapentin (NEURONTIN) 300 MG capsule Take 600 mg by mouth 3 (three) times daily.     Marland Kitchen glucose blood (ACCU-CHEK GUIDE) test strip 1 each by Other route 3 (three) times daily as needed for other. as directed Dx E11.9 100 strip 11  . HYDROcodone-acetaminophen (NORCO) 10-325 MG tablet TAKE 1 TABLET BY MOUTH EVERY 8 HOURS AS NEEDED 90 tablet 0  . JANUVIA 25 MG tablet TAKE 1 TABLET BY MOUTH DAILY (Patient not taking: Reported on 08/11/2019) 30 tablet 1  . levothyroxine (SYNTHROID) 75 MCG tablet TAKE 1 TABLET BY MOUTH  DAILY BEFORE BREAKFAST 90 tablet 1  . loratadine (CLARITIN) 10 MG tablet Take 10 mg by mouth daily.    . meloxicam (MOBIC) 15 MG tablet Take 15 mg by mouth daily.    . metFORMIN (GLUMETZA) 500 MG (MOD) 24 hr tablet Take 2 tablets (1,000 mg total) by mouth daily with breakfast. 180 tablet 1  . nystatin (MYCOSTATIN/NYSTOP) powder APPLY TO THE AFFECTED AREA(S) TWICE DAILY (Patient not taking: Reported on 08/11/2019) 15 g 0  . omeprazole (PRILOSEC) 40 MG capsule TAKE ONE CAPSULE BY MOUTH DAILY BEFORE BREAKFAST and TAKE ONE CAPSULE DAILY BEFORE DINNER 60 capsule 2  . ondansetron (ZOFRAN) 4 MG tablet Take 1 tablet (4 mg total) by mouth every 8 (eight) hours as needed for nausea or vomiting. (Patient not taking: Reported on 08/11/2019) 10  tablet 0  . OXYGEN Inhale 2 L into the lungs at bedtime.     . potassium chloride (KLOR-CON) 10 MEQ tablet Take 1 tablet (10 mEq total) by mouth at bedtime. 90 tablet 0  . Tiotropium Bromide Monohydrate (SPIRIVA RESPIMAT) 2.5 MCG/ACT AERS Inhale 2 puffs into the lungs daily. 12 g 0  . triamterene-hydrochlorothiazide (MAXZIDE-25) 37.5-25 MG tablet Take 1 tablet by mouth daily. 90 tablet 2  . venlafaxine XR (EFFEXOR XR) 37.5 MG 24 hr capsule Take 1 capsule (37.5 mg total) by mouth daily with breakfast. 30 capsule 1   No current facility-administered medications on file prior to visit.    Allergies  Allergen Reactions  . Nsaids Diarrhea  . Aleve [Naproxen Sodium] Other (See Comments)    Headache   . Codeine Nausea Only and Other (See Comments)    GI upset  . Penicillins Nausea Only and Other (See Comments)    GI upset   . Sulfonamide Derivatives Hives    Family History  Problem Relation Age of Onset  . COPD Mother   . Heart disease Mother   . Lung disease Father        Asbestosis  . Heart attack Father   . Heart disease Father   . Cerebral aneurysm Brother   . Aneurysm Brother        Brain  . Epilepsy Son   . Arthritis Maternal Grandmother   . Heart disease Maternal Grandmother   . Asthma Maternal Grandfather   . Cancer Maternal Grandfather   . Arthritis Paternal Grandmother   . Heart disease Paternal Grandmother   . Stroke Paternal Grandmother   . Early death Paternal Grandfather   . Heart disease Paternal Grandfather     Social History   Socioeconomic History  . Marital status: Married    Spouse name: Not on file  . Number of children: Not on file  . Years of education: Not on file  . Highest education level: Not on file  Occupational History  . Not on file  Tobacco Use  . Smoking status: Current Every Day Smoker    Packs/day: 1.50    Years: 46.00    Pack years: 69.00    Types: Cigarettes  . Smokeless tobacco: Never Used  Vaping Use  . Vaping Use: Never  used  Substance and Sexual Activity  . Alcohol use: No  . Drug use: No  . Sexual activity: Yes  Other Topics Concern  . Not on file  Social History Narrative   Right handed    Caffeine~ 2 cups per day    Lives at home with husband (strained relationship)   Primary caretaker for disabled brother who had  aneurism   Daughter died May 2020    Social Determinants of Health   Financial Resource Strain:   . Difficulty of Paying Living Expenses:   Food Insecurity:   . Worried About Charity fundraiser in the Last Year:   . Arboriculturist in the Last Year:   Transportation Needs:   . Film/video editor (Medical):   Marland Kitchen Lack of Transportation (Non-Medical):   Physical Activity:   . Days of Exercise per Week:   . Minutes of Exercise per Session:   Stress:   . Feeling of Stress :   Social Connections:   . Frequency of Communication with Friends and Family:   . Frequency of Social Gatherings with Friends and Family:   . Attends Religious Services:   . Active Member of Clubs or Organizations:   . Attends Archivist Meetings:   Marland Kitchen Marital Status:     Review of Systems - See HPI.  All other ROS are negative.  Wt 232 lb (105.2 kg)   BMI 42.43 kg/m   Physical Exam Vitals reviewed.  Constitutional:      Appearance: Normal appearance.  HENT:     Head: Normocephalic and atraumatic.     Mouth/Throat:     Mouth: Mucous membranes are moist.  Eyes:     Conjunctiva/sclera: Conjunctivae normal.     Pupils: Pupils are equal, round, and reactive to light.  Cardiovascular:     Rate and Rhythm: Normal rate and regular rhythm.     Pulses: Normal pulses.     Heart sounds: Normal heart sounds.  Pulmonary:     Effort: Pulmonary effort is normal.     Breath sounds: Normal breath sounds.  Musculoskeletal:     Cervical back: Neck supple.  Neurological:     General: No focal deficit present.     Mental Status: She is alert and oriented to person, place, and time.      Coordination: Coordination normal.     Gait: Gait normal.  Psychiatric:        Attention and Perception: Attention normal. She is attentive. She perceives visual hallucinations. She does not perceive auditory hallucinations.        Mood and Affect: Mood is depressed. Affect is flat.        Speech: She is communicative. Speech is not rapid and pressured, delayed, slurred or tangential.        Behavior: Behavior normal. Behavior is cooperative.        Thought Content: Thought content is delusional. Thought content does not include homicidal or suicidal ideation. Thought content does not include homicidal or suicidal plan.        Cognition and Memory: Cognition and memory normal.        Judgment: Judgment is not impulsive.     Comments: Very poor insight.     Recent Results (from the past 2160 hour(s))  Comprehensive metabolic panel     Status: Abnormal   Collection Time: 07/07/19  2:04 PM  Result Value Ref Range   Sodium 141 135 - 145 mmol/L   Potassium 4.4 3.5 - 5.1 mmol/L   Chloride 101 98 - 111 mmol/L   CO2 32 22 - 32 mmol/L   Glucose, Bld 99 70 - 99 mg/dL    Comment: Glucose reference range applies only to samples taken after fasting for at least 8 hours.   BUN 17 8 - 23 mg/dL   Creatinine, Ser 1.01 (H) 0.44 - 1.00  mg/dL   Calcium 9.0 8.9 - 10.3 mg/dL   Total Protein 7.3 6.5 - 8.1 g/dL   Albumin 3.5 3.5 - 5.0 g/dL   AST 37 15 - 41 U/L   ALT 29 0 - 44 U/L   Alkaline Phosphatase 89 38 - 126 U/L   Total Bilirubin 0.6 0.3 - 1.2 mg/dL   GFR calc non Af Amer 59 (L) >60 mL/min   GFR calc Af Amer >60 >60 mL/min   Anion gap 8 5 - 15    Comment: Performed at [facility], [address], [place], [place] [address]  CBC     Status: Abnormal   Collection Time: 07/07/19  2:04 PM  Result Value Ref Range   WBC 8.9 4.0 - 10.5 K/uL   RBC 4.18 3.87 - 5.11 MIL/uL   Hemoglobin 11.3 (L) 12.0 - 15.0 g/dL   HCT 37.4 36 - 46 %   MCV 89.5 80.0 - 100.0 fL   MCH 27.0 26.0 - 34.0 pg   MCHC 30.2  30.0 - 36.0 g/dL   RDW 19.4 (H) 11.5 - 15.5 %   Platelets 174 150 - 400 K/uL   nRBC 0.0 0.0 - 0.2 %    Comment: Performed at [facility], [address] [place], [place] [address]  Urinalysis, Routine w reflex microscopic     Status: Abnormal   Collection Time: 07/07/19  3:04 PM  Result Value Ref Range   Color, Urine YELLOW YELLOW   APPearance CLEAR CLEAR   Specific Gravity, Urine 1.014 1.005 - 1.030   pH 6.0 5.0 - 8.0   Glucose, UA NEGATIVE NEGATIVE mg/dL   Hgb urine dipstick NEGATIVE NEGATIVE   Bilirubin Urine NEGATIVE NEGATIVE   Ketones, ur NEGATIVE NEGATIVE mg/dL   Protein, ur NEGATIVE NEGATIVE mg/dL   Nitrite POSITIVE (A) NEGATIVE   Leukocytes,Ua NEGATIVE NEGATIVE   WBC, UA 0-5 0 - 5 WBC/hpf   Bacteria, UA MANY (A) NONE SEEN   Squamous Epithelial / LPF 0-5 0 - 5    Comment: Performed at [facility], [address], [place] [address]  Urine culture     Status: Abnormal   Collection Time: 07/07/19  3:05 PM   Specimen: Urine, Random  Result Value Ref Range   Specimen Description      URINE, RANDOM Performed at [facility], [address] [place], [place] [address]    Special Requests      NONE Performed at [facility], [address], [place], [place] [address]    Culture >=100,000 COLONIES/mL ESCHERICHIA COLI (A)    Report Status 07/10/2019 FINAL    Organism ID, Bacteria ESCHERICHIA COLI (A)       Susceptibility   Escherichia coli - MIC*    AMPICILLIN >=32 RESISTANT Resistant     CEFAZOLIN <=4 SENSITIVE Sensitive     CEFTRIAXONE <=1 SENSITIVE Sensitive     CIPROFLOXACIN <=0.25 SENSITIVE Sensitive     GENTAMICIN <=1 SENSITIVE Sensitive     IMIPENEM <=0.25 SENSITIVE Sensitive     NITROFURANTOIN <=16 SENSITIVE Sensitive     TRIMETH/SULFA <=20 SENSITIVE Sensitive     AMPICILLIN/SULBACTAM 16 INTERMEDIATE Intermediate     PIP/TAZO <=4 SENSITIVE Sensitive     * >=100,000 COLONIES/mL ESCHERICHIA COLI  Ammonia     Status: None   Collection Time: 07/07/19  3:17  PM  Result Value Ref Range   Ammonia 33 9 - 35 umol/L    Comment: Performed at [facility], [address]  [place], [place] [address]  Blood gas, venous (at [facility] and [facility], not at [facility])     Status: Abnormal   Collection Time: 07/07/19  3:17 PM  Result Value Ref Range   FIO2 21.00    pH, Ven 7.384 7.25 - 7.43   pCO2, Ven 55.6 44 - 60 mmHg   pO2, Ven 39.4 32 - 45 mmHg   Bicarbonate 29.7 (H) 20.0 - 28.0 mmol/L   Acid-Base Excess 7.4 (H) 0.0 - 2.0 mmol/L   O2 Saturation 69.5 %   Patient temperature 36.6     Comment: Performed at [facility], [address], [place], [place] [address]  Basic metabolic panel     Status: Abnormal   Collection Time: 07/09/19  9:56 AM  Result Value Ref Range   Sodium 139 135 - 145 mEq/L   Potassium 4.0 3.5 - 5.1 mEq/L   Chloride 102 96 - 112 mEq/L   CO2 33 (H) 19 - 32 mEq/L   Glucose, Bld 116 (H) 70 - 99 mg/dL   BUN 14 6 - 23 mg/dL   Creatinine, Ser 0.88 0.40 - 1.20 mg/dL   GFR 64.81 >60.00 mL/min   Calcium 8.7 8.4 - 10.5 mg/dL  POCT Urinalysis Dipstick     Status: Normal   Collection Time: 07/27/19 11:55 AM  Result Value Ref Range   Color, UA yellow    Clarity, UA clear    Glucose, UA Negative Negative   Bilirubin, UA Negative    Ketones, UA Negative    Spec Grav, UA 1.015 1.010 - 1.025   Blood, UA Negative    pH, UA 6.0 5.0 - 8.0   Protein, UA Negative Negative   Urobilinogen, UA 0.2 0.2 or 1.0 E.U./dL   Nitrite, UA Negative    Leukocytes, UA Negative Negative   Appearance     Odor    Urine Culture     Status: None   Collection Time: 07/27/19 11:55 AM   Specimen: Urine  Result Value Ref Range   MICRO NUMBER: [id number]    SPECIMEN QUALITY: Adequate    Sample Source NOT GIVEN    STATUS: FINAL    Result: No Growth     Assessment/Plan: 1. Hallucinations 2. Delusional disorder ([place]) 3. Anxiety and depression Unclear etiology of hallucinations. Negative CT. Prior UTI has been treated with test of cure obtained at last visit. No recurrence of  those symptoms. Will recheck labs today and include A1C, recheck of her TSH, B12, RPR and UDS. Will try to expedite Neurology and Psychiatry appts ASAP. Reviewed [facility] [facility] services to patient and husband who agree to go if anything worsens. Will increase her Effexor to 112.5 mg daily.  - Comp Met (CMET) - Hemoglobin A1c - TSH - B12 - RPR - DRUG MONITORING, PANEL 8 WITH CONFIRMATION, URINE  4. Chronic pain syndrome Stable overall. Spoke with PharmD, [name], who is with our practice. Feels potentially dose of Gabapentin is causing the issue with hallucinations. Will decrease dose and reach out to her Neurologist who prescribes this. Hopefully this will help with things as other options have been exhausted. Again will try to get her in with specialists ASAP. ER precautions reviewed. Will add on Meloxicam to help with pain for now. Continue other medications as directed.   This visit occurred during the SARS-CoV-2 public health emergency.  Safety protocols were in place, including screening questions prior to the visit, additional usage of staff PPE, and extensive cleaning of exam room while observing appropriate  contact time as indicated for disinfecting solutions.     [name], PA-C

## 2019-08-12 NOTE — Telephone Encounter (Signed)
On call: Meloxicam Rx was re-sent Thanks

## 2019-08-12 NOTE — Patient Instructions (Addendum)
Please keep hydrated and keep a well-balanced diet.  I am increasing your Effexor XR to 75 mg daily.  We will keep the Alprazolam at 4 x daily.   For the Gabapentin, please decrease to 300 mg each AM, 300 mg afternoon and 600 mg near bedtime.   I am working very hard to get your specialist appointments.  I have sent in a small dose of Meloxicam to help with lower back pain. This should calm down over this next week.  If anything worsens I want you to go to the [facility].

## 2019-08-15 LAB — DRUG MONITORING, PANEL 8 WITH CONFIRMATION, URINE
6 Acetylmorphine: NEGATIVE ng/mL (ref <10)
Alcohol Metabolites: NEGATIVE ng/mL
Alphahydroxyalprazolam: 190 ng/mL — ABNORMAL HIGH (ref <25)
Alphahydroxymidazolam: NEGATIVE ng/mL (ref <50)
Alphahydroxytriazolam: NEGATIVE ng/mL (ref <50)
Aminoclonazepam: NEGATIVE ng/mL (ref <25)
Amphetamines: NEGATIVE ng/mL (ref <500)
Benzodiazepines: POSITIVE ng/mL — AB (ref <100)
Buprenorphine, Urine: NEGATIVE ng/mL (ref <5)
Cocaine Metabolite: NEGATIVE ng/mL (ref <150)
Codeine: NEGATIVE ng/mL (ref <50)
Creatinine: 62.2 mg/dL
Hydrocodone: 945 ng/mL — ABNORMAL HIGH (ref <50)
Hydromorphone: NEGATIVE ng/mL (ref <50)
Hydroxyethylflurazepam: NEGATIVE ng/mL (ref <50)
Lorazepam: NEGATIVE ng/mL (ref <50)
MDMA: NEGATIVE ng/mL (ref <500)
Marijuana Metabolite: NEGATIVE ng/mL (ref <20)
Morphine: NEGATIVE ng/mL (ref <50)
Nordiazepam: NEGATIVE ng/mL (ref <50)
Norhydrocodone: 980 ng/mL — ABNORMAL HIGH (ref <50)
Opiates: POSITIVE ng/mL — AB (ref <100)
Oxazepam: NEGATIVE ng/mL (ref <50)
Oxidant: NEGATIVE ug/mL
Oxycodone: NEGATIVE ng/mL (ref <100)
Temazepam: NEGATIVE ng/mL (ref <50)
pH: 6.8 (ref 4.5–9.0)

## 2019-08-15 LAB — RPR: RPR Ser Ql: NONREACTIVE

## 2019-08-15 LAB — DM TEMPLATE

## 2019-08-17 ENCOUNTER — Telehealth: Payer: Self-pay | Admitting: Physician Assistant

## 2019-08-17 ENCOUNTER — Other Ambulatory Visit: Payer: Self-pay | Admitting: Emergency Medicine

## 2019-08-17 DIAGNOSIS — E039 Hypothyroidism, unspecified: Secondary | ICD-10-CM

## 2019-08-17 MED ORDER — LEVOTHYROXINE SODIUM 100 MCG PO TABS: ORAL_TABLET | ORAL | 1 refills | Status: DC

## 2019-08-17 NOTE — Telephone Encounter (Signed)
Called patient back with her lab results

## 2019-08-17 NOTE — Telephone Encounter (Signed)
Pt returned phone about the lab results.

## 2019-08-18 ENCOUNTER — Telehealth: Payer: Self-pay | Admitting: Physician Assistant

## 2019-08-18 MED ORDER — FLUTICASONE PROPIONATE 50 MCG/ACT NA SUSP: 2.0000 | Freq: Every day | NASAL | 6 refills | Status: DC

## 2019-08-18 NOTE — Telephone Encounter (Signed)
Ms. Zern - needs her xanax 1mg  & generic flonase sent to [facility] in [place].

## 2019-08-18 NOTE — Progress Notes (Signed)
  Chronic Care Management   Outreach Note  08/18/2019 Name: [name] MRN: [id number] DOB: 1955-02-10  Referred by: Brunetta Jeans, PA-C Reason for referral : No chief complaint on file.   An unsuccessful telephone outreach was attempted today. The patient was referred to the pharmacist for assistance with care management and care coordination.   Follow Up Plan:   Earney [place] Upstream Scheduler

## 2019-08-18 NOTE — Telephone Encounter (Signed)
Spoke with patient advised that her last rx for Xanax was sent to the pharmacy on 08/12/19. She states they did not fill the medication. I will call the pharmacy to verify. Refill of Flonase sent to the pharmacy  [facility] pharmacy states the Xanax needed a PA. PA started on Cover My Meds today. Waiting on response from insurance Key: [id number] - PA Case ID: [id number] - Rx #: [id number]

## 2019-08-19 NOTE — Telephone Encounter (Signed)
Spoke with patient insurance [employer] about status of PA-urgent Alprazolam 1 mg qid has been approved by patient insurance [employer] until 01/28/2020 Spoke with Laurey Arrow at [facility] to go ahead an fill patient rx for Xanax on file from 08/12/19. He is agreeable and notified of approval of medication by patient insurance company.

## 2019-08-23 ENCOUNTER — Other Ambulatory Visit: Payer: Self-pay | Admitting: Physician Assistant

## 2019-08-23 ENCOUNTER — Other Ambulatory Visit: Payer: Self-pay | Admitting: Adult Health

## 2019-08-23 DIAGNOSIS — G894 Chronic pain syndrome: Secondary | ICD-10-CM

## 2019-08-23 NOTE — Telephone Encounter (Signed)
Indication for chronic opioid: OA, chronic lumbago, Fibromyalgia Medication and dose: Hydrocodone-APAP 10/325 mg tid # pills per month: 90 on 07/28/2019 Last UDS date: 08/12/19  Opioid Treatment Agreement signed (Y/N): Yes 08/18/17 Opioid Treatment Agreement last reviewed with patient:   NCCSRS reviewed this encounter (include red flags):

## 2019-08-24 ENCOUNTER — Telehealth: Payer: Self-pay | Admitting: Physician Assistant

## 2019-08-24 NOTE — Telephone Encounter (Signed)
Patient would like to talk to you - please call - she does not want to tell me what it was about - she said it is personal

## 2019-08-25 NOTE — Telephone Encounter (Signed)
Patient was calling to check the status of her refill of the Hydrocodone medication. She is not out of medication but will be out tomorrow. She states she had a difficult time getting the medication filled last time. I think we had to do a prior authorization  The medication is pending in your in basket Patient is aware that PCP is out of the office

## 2019-08-26 NOTE — Telephone Encounter (Signed)
No her Xanax was moved back to qid. The pain medicine is to remain at same dosing

## 2019-08-26 NOTE — Telephone Encounter (Signed)
Refill has been sent in.  

## 2019-08-26 NOTE — Telephone Encounter (Signed)
Patient called back and said that her hydrocodone should have been increased, but the rx does not say that.  It is still saying 3.  Please call and advise.

## 2019-08-27 ENCOUNTER — Other Ambulatory Visit: Payer: Self-pay | Admitting: Physician Assistant

## 2019-08-27 DIAGNOSIS — L299 Pruritus, unspecified: Secondary | ICD-10-CM

## 2019-08-27 NOTE — Telephone Encounter (Signed)
Spoke with patient and verified her medication. Continue Hydrocodone tid prn for pain, Xanax qid prn for anxiety and Gabapentin was decreased to 300 mg 1 in AM, 1 in afternoon and 2 at bedtime. She is agreeable. Advised patient that she shouldn't be taking the Hydroxyzine tid. This was for itching. No itching, stop the medication. She is agreeable.

## 2019-09-02 ENCOUNTER — Other Ambulatory Visit: Payer: Self-pay | Admitting: Physician Assistant

## 2019-09-07 ENCOUNTER — Telehealth: Payer: Self-pay | Admitting: Physician Assistant

## 2019-09-07 NOTE — Telephone Encounter (Signed)
They need to call Pulmonology -- she sees [facility].

## 2019-09-07 NOTE — Telephone Encounter (Signed)
Caryl Pina from [facility] called.  He needs to talk to you about [name]'s cpap & oxygen use.  He said that he would have to have a new order for her insurance to cover it.

## 2019-09-08 ENCOUNTER — Other Ambulatory Visit: Payer: Self-pay | Admitting: Physician Assistant

## 2019-09-08 NOTE — Telephone Encounter (Signed)
[name] dropped off order forms, didn't know if needed to go to pulmonology until after he was gone, I have faxed the orders over the that office and made [name] aware of this.

## 2019-09-08 NOTE — Telephone Encounter (Signed)
Xanax last rx 08/12/19 #120 LOV: 08/12/19 Hallucinations CSC: 08/18/17

## 2019-09-13 ENCOUNTER — Other Ambulatory Visit: Payer: Self-pay | Admitting: Physician Assistant

## 2019-09-13 ENCOUNTER — Telehealth: Payer: Medicare HMO

## 2019-09-17 ENCOUNTER — Ambulatory Visit: Payer: Medicare Other | Admitting: Gastroenterology

## 2019-09-21 ENCOUNTER — Telehealth: Payer: Self-pay | Admitting: Physician Assistant

## 2019-09-21 NOTE — Telephone Encounter (Signed)
Will address medications at time of visit, not before.

## 2019-09-21 NOTE — Telephone Encounter (Signed)
Patient has scheduled a virtual visit for tomorrow 8/25 with Einar Pheasant in regard to a rash that has broke out this morning on her legs.   Patient states she has seen [facility] for this issue before.  Patient did not want a virtual visit with anyone else.  Patient is requesting a cream for itch to be sent to [facility] today.  I did inform patient that it is not typical for medication to be sent to a pharmacy without seeing the patient.

## 2019-09-21 NOTE — Telephone Encounter (Signed)
Patient has appointment tomorrow to discuss.

## 2019-09-22 ENCOUNTER — Telehealth (INDEPENDENT_AMBULATORY_CARE_PROVIDER_SITE_OTHER): Payer: Medicare Other | Admitting: Physician Assistant

## 2019-09-22 ENCOUNTER — Encounter: Payer: Self-pay | Admitting: Physician Assistant

## 2019-09-22 ENCOUNTER — Other Ambulatory Visit: Payer: Self-pay

## 2019-09-22 DIAGNOSIS — L299 Pruritus, unspecified: Secondary | ICD-10-CM | POA: Diagnosis not present

## 2019-09-22 DIAGNOSIS — R21 Rash and other nonspecific skin eruption: Secondary | ICD-10-CM | POA: Diagnosis not present

## 2019-09-22 MED ORDER — TRIAMCINOLONE ACETONIDE 0.1 % EX CREA: 1.0000 "application " | TOPICAL_CREAM | Freq: Every day | CUTANEOUS | 0 refills | Status: DC

## 2019-09-22 NOTE — Progress Notes (Signed)
Virtual Visit via Telephone Note  I connected with [name] on 09/22/19 at  9:00 AM EDT by telephone and verified that I am speaking with the correct [name] using two identifiers.  Location: Patient: Home Provider: [facility]   I discussed the limitations, risks, security and privacy concerns of performing an evaluation and management service by telephone and the availability of in [name] appointments. I also discussed with the patient that there may be a patient responsible charge related to this service. The patient expressed understanding and agreed to proceed.  History of Present Illness: Patient with ongoing issue of intermittent rash of lower extremities thought initially related to occasional peripheral edema and weeping of skin.  Notes he has been much improved but has noticed a recurrence of rash over the past few days with last night being very significant.  Notes intense pruritus without pain.  States her legs are not swollen.  Is wondering if this could be stress related due to the amount of stress and anxiety she has had recently.  Has been taking Benadryl which does help with her symptoms.  Notes symptoms improved this morning compared to yesterday.  Was previously referred to dermatology and missed her new patient appointment in June but has rescheduled for Monday morning.  Denies fever, chills, malaise or fatigue.   Observations/Objective: No labored breathing.  Speech is clear and coherent with logical content.  Patient is alert and oriented at baseline.  Unable to view current state of rash via telephone visit.  Assessment and Plan: 1. Rash Ongoing.  Given pruritus will refill triamcinolone.  No swelling noted at present per patient report. Family has an appointment with dermatology.  Discussed with her that this very well could be related to the severe amount of stress and anxiety she has dealt with recently.  Thankfully has a pending appointment with  psychiatry.  Encouraged her to use cream as directed.  Cool compresses and witch hazel recommended.  Follow-up with dermatology on Monday as scheduled.  Discussed if anything is worsening would want to see her in office this week.   Follow Up Instructions: I discussed the assessment and treatment plan with the patient. The patient was provided an opportunity to ask questions and all were answered. The patient agreed with the plan and demonstrated an understanding of the instructions.   The patient was advised to call back or seek an in-[name] evaluation if the symptoms worsen or if the condition fails to improve as anticipated.  I provided 10 minutes of non-face-to-face time during this encounter.   [name], PA-C

## 2019-09-22 NOTE — Progress Notes (Signed)
I have discussed the procedure for the virtual visit with the patient who has given consent to proceed with assessment and treatment.   [name] [name], CMA     

## 2019-09-22 NOTE — Addendum Note (Signed)
Addended by: Leonidas Romberg on: 09/22/2019 04:59 PM   Modules accepted: Orders

## 2019-09-23 ENCOUNTER — Telehealth: Payer: Medicare HMO
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
## 2019-09-24 ENCOUNTER — Other Ambulatory Visit: Payer: Self-pay | Admitting: Physician Assistant

## 2019-09-24 DIAGNOSIS — G894 Chronic pain syndrome: Secondary | ICD-10-CM

## 2019-09-24 MED ORDER — HYDROCODONE-ACETAMINOPHEN 10-325 MG PO TABS: 1.0000 | ORAL_TABLET | Freq: Three times a day (TID) | ORAL | 0 refills | Status: DC | PRN

## 2019-09-24 NOTE — Telephone Encounter (Signed)
Pt called in requesting a refill on the atorvastatin to be sent to [facility], pt can be reached at the home #

## 2019-09-24 NOTE — Telephone Encounter (Signed)
Indication for chronic opioid: Chronic pain syndrome, Degenerative lumbar spinal stenosis Medication and dose: Hydrocodone-APAP 10/325 mg # pills per month: #90 on 08/26/19 Last UDS date: 08/31/2018 Opioid Treatment Agreement signed (Y/N): Yes, 08/21/17 Opioid Treatment Agreement last reviewed with patient:   [facility] reviewed this encounter (include red flags):     LOV:09/22/19 Rash

## 2019-09-24 NOTE — Telephone Encounter (Signed)
Pt called in asking for the hydrocodone to be sent into [facility]. Please advise   Pt can be reached at the home #

## 2019-09-28 ENCOUNTER — Other Ambulatory Visit: Payer: Self-pay | Admitting: Physician Assistant

## 2019-09-28 ENCOUNTER — Other Ambulatory Visit: Payer: Self-pay | Admitting: Gastroenterology

## 2019-09-28 DIAGNOSIS — E039 Hypothyroidism, unspecified: Secondary | ICD-10-CM

## 2019-09-30 ENCOUNTER — Telehealth (INDEPENDENT_AMBULATORY_CARE_PROVIDER_SITE_OTHER): Payer: Medicare Other | Admitting: Psychiatry

## 2019-09-30 ENCOUNTER — Ambulatory Visit: Payer: Medicare Other

## 2019-09-30 ENCOUNTER — Telehealth (HOSPITAL_COMMUNITY): Payer: Self-pay | Admitting: Psychiatry

## 2019-09-30 ENCOUNTER — Other Ambulatory Visit: Payer: Self-pay

## 2019-09-30 ENCOUNTER — Encounter (HOSPITAL_COMMUNITY): Payer: Self-pay | Admitting: Psychiatry

## 2019-09-30 DIAGNOSIS — F331 Major depressive disorder, recurrent, moderate: Secondary | ICD-10-CM

## 2019-09-30 MED ORDER — VENLAFAXINE HCL ER 150 MG PO CP24: 150.0000 mg | ORAL_CAPSULE | Freq: Every day | ORAL | 1 refills | Status: DC

## 2019-09-30 NOTE — Progress Notes (Unsigned)
Chronic Care Management Pharmacy  Name: [name]  MRN: [id number] DOB: Feb 10, 1955  Chief Complaint/ HPI  [name],  64 y.o. , female presents for their Initial CCM visit with the clinical pharmacist via telephone due to COVID-19 Pandemic.   PCP : Brunetta Jeans, PA-C  States she spends most of her time watching TV in the living room. Ongoing discomfort due to pain in back and knees. Managed with meloxicam, hydrocodone, gabapentin, venlafaxine.   Acid reflux has been well controlled on omeprazole 40 mg twice daily.  Reports some diarrhea w/ metformin but tolerating overall. Take two 500 mg tablets at once every morning. 'if nerves torn up I go to the bathroom much more'. BG occasionally getting above 200, typically ~125 in AM.   Mother died at age 54. Is also morning the loss of daughter and father, reporting that she thinks about them every day. States that she spends a lot of time thinking about them each day. Also states not ever adjusting after leaving [place] as a child. Reports 4 previous marriages.   Describes previous visual hallucinations. Today reports that she recently saw her husband walking around the house with a new girlfirend and her children. She mentions seeing a new born baby on the couch. She feels her husband is Previous reports note healthy relationship between patient and husband.   Will be seeing PCP tomorrow 11am, psychiatry referral in place. Previously failed xanax dose reduction. May consider slow taper of gabapentin. Discussed toxicology, a1c, tsh, b12, RPR with PCP.  No diagnosis found.  Office Visits:  08/05/2019 (Tammy P, Pulmonology): smoking cessation,  Continue oxygen spiriva symbicort. Still smoking. 4 month f/u with Dr. Melvyn Novas. 07/09/2019 (PCP): Xanax decrease from QID to TID. Cymbalta decreased from 60 mg daily to 30 mg daily x 7 days until f/u. RN to come to patient's home for med rec. 07/07/2019 (ED): Hallucinations, confusion. Generalized  cerebral atrophy. No acute intracranial abnormality.  Patient Active Problem List   Diagnosis Date Noted  . Diabetes mellitus, new onset ([place]) 04/12/2019  . Tremor 11/05/2018  . Chronic respiratory failure with hypoxia ([place]) 01/12/2018  . GERD with esophagitis 11/26/2017  . Dysphagia 08/20/2017  . Chronic pain syndrome 08/04/2017  . Comorbid sleep-related hypoventilation 08/04/2017  . OSA (obstructive sleep apnea) 08/04/2017  . Hepatic steatosis 08/04/2017  . Polyp of ascending colon   . Diverticulosis of colon without hemorrhage   . Gastritis and gastroduodenitis   . Hypothyroidism 07/22/2017  . Anxiety and depression 07/22/2017  . Iron deficiency anemia 07/22/2017  . Pilar cyst 07/22/2017  . Morbid obesity due to excess calories ([place]) 03/19/2017  . Anemia 03/19/2017  . COPD GOLD II still smoking  03/18/2017  . Degenerative lumbar spinal stenosis 02/17/2017  . S/P right hip revision 09/02/2016  . S/P hip replacement 09/02/2016  . Instability of prosthetic hip ([place]) 03/01/2016  . Dislocation of hip prosthesis ([place]) 02/17/2016  . Abnormal nuclear stress test: Intermediate Risk 10/31/2015  . Atypical angina ([place]) 10/31/2015  . Trigger finger, acquired 08/20/2012  . Cigarette smoker 12/12/2008  . Hyperlipidemia 11/17/2008  . HYPERTENSION, BENIGN 11/16/2008  . GEN OSTEOARTHROSIS INVOLVING MULTIPLE SITES 11/16/2008   Past Surgical History:  Procedure Laterality Date  . APPENDECTOMY     1985  . BIOPSY  07/24/2017   Procedure: BIOPSY;  Surgeon: Milus Banister, MD;  Location: [facility];  Service: Endoscopy;;  . CARDIAC CATHETERIZATION N/A 10/31/2015   Procedure: Left Heart Cath and Coronary Angiography;  Surgeon: Leonie Man, MD;  Location: [place] [facility];  Service: Cardiovascular;  Laterality: N/A;  . CARPAL TUNNEL RELEASE Left   . CARPAL TUNNEL RELEASE Right   . CHOLECYSTECTOMY  late 1980's  . COLONOSCOPY WITH PROPOFOL N/A 07/24/2017   Procedure: COLONOSCOPY WITH  PROPOFOL;  Surgeon: Milus Banister, MD;  [facility]: [facility];  Service: Endoscopy;  Laterality: N/A;  . ESOPHAGOGASTRODUODENOSCOPY N/A 07/24/2017   Procedure: ESOPHAGOGASTRODUODENOSCOPY (EGD);  Surgeon: Milus Banister, MD;  Location: [facility];  Service: Endoscopy;  Laterality: N/A;  . GALLBLADDER SURGERY  1991  . HIP CLOSED REDUCTION Right 01/08/2016   Procedure: CLOSED MANIPULATION HIP;  Surgeon: Susa Day, MD;  Location: [facility];  Service: Orthopedics;  Laterality: Right;  . HIP CLOSED REDUCTION Right 01/19/2016   Procedure: ATTEMPTED CLOSED REDUCTION RIGHT HIP;  Surgeon: Wylene Simmer, MD;  Location: [facility];  Service: Orthopedics;  Laterality: Right;  . HIP CLOSED REDUCTION Right 01/20/2016   Procedure: CLOSED REDUCTION RIGHT TOTAL HIP;  Surgeon: Paralee Cancel, MD;  Location: [facility];  Service: Orthopedics;  Laterality: Right;  . HIP CLOSED REDUCTION Right 02/17/2016   Procedure: CLOSED REDUCTION RIGHT TOTAL HIP;  Surgeon: Rod Can, MD;  Location: [place];  Service: Orthopedics;  Laterality: Right;  . HIP CLOSED REDUCTION Right 02/28/2016   Procedure: CLOSED REDUCTION HIP;  Surgeon: Nicholes Stairs, MD;  [facility];  Service: Orthopedics;  Laterality: Right;  . POLYPECTOMY  07/24/2017   Procedure: POLYPECTOMY;  Surgeon: Milus Banister, MD;  [facility];  Service: Endoscopy;;  . TONSILLECTOMY    . TOTAL ABDOMINAL HYSTERECTOMY     1985, with 1 ovary removed and 2 nd ovary removed 2003  . TOTAL HIP ARTHROPLASTY Right    Original surgery 2006 with revision 2010  . TOTAL HIP REVISION Right 01/01/2016   Procedure: TOTAL HIP REVISION;  Surgeon: Paralee Cancel, MD;  Location: [facility];  Service: Orthopedics;  Laterality: Right;  . TOTAL HIP REVISION Right 03/02/2016   Procedure: TOTAL HIP REVISION;  Surgeon: Paralee Cancel, MD;  Location[facility];  Service: Orthopedics;  Laterality: Right;  . TOTAL HIP REVISION Right 09/02/2016   Procedure: Right hip constrained liner-  posterior;  Surgeon: Paralee Cancel, MD;  Location: [facility];  Service: Orthopedics;  Laterality: Right;  . ULNAR NERVE TRANSPOSITION Right    Social History   Socioeconomic History  . Marital status: Married    Spouse name: Not on file  . Number of children: Not on file  . Years of education: Not on file  . Highest education level: Not on file  Occupational History  . Not on file  Tobacco Use  . Smoking status: Current Every Day Smoker    Packs/day: 1.50    Years: 46.00    Pack years: 69.00    Types: Cigarettes  . Smokeless tobacco: Never Used  Vaping Use  . Vaping Use: Never used  Substance and Sexual Activity  . Alcohol use: No  . Drug use: No  . Sexual activity: Yes  Other Topics Concern  . Not on file  Social History Narrative   Right handed    Caffeine~ 2 cups per day    Lives at home with husband (strained relationship)   Primary caretaker for disabled brother who had aneurism   Daughter died 06/24/2018    Social Determinants of Health   Financial Resource Strain:   . Difficulty of Paying Living Expenses: Not on file  Food Insecurity:   .  Worried About Charity fundraiser in the Last Year: Not on file  . Ran Out of Food in the Last Year: Not on file  Transportation Needs:   . Lack of Transportation (Medical): Not on file  . Lack of Transportation (Non-Medical): Not on file  Physical Activity:   . Days of Exercise per Week: Not on file  . Minutes of Exercise per Session: Not on file  Stress:   . Feeling of Stress : Not on file  Social Connections:   . Frequency of Communication with Friends and Family: Not on file  . Frequency of Social Gatherings with Friends and Family: Not on file  . Attends Religious Services: Not on file  . Active Member of Clubs or Organizations: Not on file  . Attends Archivist Meetings: Not on file  . Marital Status: Not on file   Family History  Problem Relation Age of Onset  . COPD Mother   . Heart disease Mother   .  Lung disease Father        Asbestosis  . Heart attack Father   . Heart disease Father   . Cerebral aneurysm Brother   . Aneurysm Brother        Brain  . Epilepsy Son   . Arthritis Maternal Grandmother   . Heart disease Maternal Grandmother   . Asthma Maternal Grandfather   . Cancer Maternal Grandfather   . Arthritis Paternal Grandmother   . Heart disease Paternal Grandmother   . Stroke Paternal Grandmother   . Early death Paternal Grandfather   . Heart disease Paternal Grandfather    Allergies  Allergen Reactions  . Nsaids Diarrhea  . Aleve [Naproxen Sodium] Other (See Comments)    Headache   . Codeine Nausea Only and Other (See Comments)    GI upset  . Penicillins Nausea Only and Other (See Comments)    GI upset   . Sulfonamide Derivatives Hives   Outpatient Encounter Medications as of 09/30/2019  Medication Sig Note  . albuterol (PROAIR HFA) 108 (90 Base) MCG/ACT inhaler 2 puffs every 4 hours as needed only  if your can't catch your breath (Patient taking differently: Inhale 2 puffs into the lungs every 4 (four) hours as needed for wheezing or shortness of breath. if your can't catch your breath)   . ALPRAZolam (XANAX) 1 MG tablet TAKE 1 TABLET BY MOUTH EVERY MORNING, AT NOON, EVERY EVENING, AND AT BEDTIME   . atorvastatin (LIPITOR) 20 MG tablet TAKE 1 TABLET BY MOUTH AT BEDTIME   . fluticasone (FLONASE) 50 MCG/ACT nasal spray Place 2 sprays into both nostrils daily.   . furosemide (LASIX) 20 MG tablet Take 1 tablet (20 mg total) by mouth daily as needed.   . gabapentin (NEURONTIN) 300 MG capsule Take 600 mg by mouth 3 (three) times daily.  12/16/2018: Possibly causing tremors   . glucose blood (ACCU-CHEK GUIDE) test strip 1 each by Other route 3 (three) times daily as needed for other. as directed Dx E11.9   . HYDROcodone-acetaminophen (NORCO) 10-325 MG tablet Take 1 tablet by mouth every 8 (eight) hours as needed.   Marland Kitchen JANUVIA 25 MG tablet TAKE 1 TABLET BY MOUTH DAILY   .  levothyroxine (SYNTHROID) 100 MCG tablet TAKE 1 TABLET BY MOUTH EVERY DAY BEFORE BREAKFAST   . loratadine (CLARITIN) 10 MG tablet Take 10 mg by mouth daily.   . meloxicam (MOBIC) 7.5 MG tablet Take 1 tablet (7.5 mg total) by mouth daily.   Marland Kitchen  metFORMIN (GLUCOPHAGE-XR) 500 MG 24 hr tablet TAKE 2 TABLETS BY MOUTH  EVERY DAY   . nystatin (MYCOSTATIN/NYSTOP) powder APPLY TO THE AFFECTED AREA(S) TWICE DAILY   . omeprazole (PRILOSEC) 40 MG capsule TAKE ONE CAPSULE BY MOUTH DAILY BEFORE BREAKFAST and TAKE ONE CAPSULE DAILY BEFORE DINNER   . ondansetron (ZOFRAN) 4 MG tablet Take 1 tablet (4 mg total) by mouth every 8 (eight) hours as needed for nausea or vomiting.   . OXYGEN Inhale 2 L into the lungs at bedtime.    . potassium chloride (KLOR-CON) 10 MEQ tablet Take 1 tablet (10 mEq total) by mouth at bedtime.   . SYMBICORT 160-4.5 MCG/ACT inhaler USE 2 INHALATIONS BY MOUTH  TWICE DAILY   . Tiotropium Bromide Monohydrate (SPIRIVA RESPIMAT) 2.5 MCG/ACT AERS Inhale 2 puffs into the lungs daily.   Marland Kitchen triamcinolone cream (KENALOG) 0.1 % Apply 1 application topically daily.   Marland Kitchen triamterene-hydrochlorothiazide (MAXZIDE-25) 37.5-25 MG tablet Take 1 tablet by mouth daily.   Marland Kitchen venlafaxine XR (EFFEXOR-XR) 75 MG 24 hr capsule TAKE ONE CAPSULE BY MOUTH DAILY WITH BREAKFAST    No facility-administered encounter medications on file as of 09/30/2019.   Patient Care Team    Relationship Specialty Notifications Start End  Brunetta Jeans, [place] PCP - General Family Medicine  07/18/17   Milus Banister, MD Attending Physician Gastroenterology  07/18/17   Tanda Rockers, MD Consulting Physician Pulmonary Disease  07/18/17   Dr. Retta Mac    07/18/17    Comment: Dentist  Paralee Cancel, MD Consulting Physician Orthopedic Surgery  07/18/17   Latanya Maudlin, MD Consulting Physician Orthopedic Surgery  07/18/17   Kathrynn Ducking, MD Consulting Physician Neurology  12/16/18   Okey Regal, [place] Physician Optometry   12/16/18    Comment: Richarda Osmond, [facility] Pharmacist Pharmacist  05/24/19    Comment: PHONE NUMBER [phone]  Deloria Lair, NP [place] Management  Admissions 07/09/19    Current Diagnosis/Assessment: Goals Addressed   None    Hypertension   BP Readings from Last 3 Encounters:  08/12/19 110/70  08/05/19 126/78  07/27/19 132/76   BP controlled at recent visits. Denies dizziness, SOB, chest pain. Patient checks BP at home infrequently. Patient is currently controlled on the following medications:  . triamterence-hydrochlorothiazide 37.5-25 mg once daily  Plan  Continue current medications    Diabetes   Recent Relevant Labs:  Lab Results  Component Value Date/Time   HGBA1C 7.0 (H) 08/12/2019 11:58 AM   HGBA1C 9.3 (A) 03/22/2019 02:44 PM   EGFR 60 (L) 06/30/2014 02:48 PM   MICROALBUR 0.4 03/12/2019 01:54 PM    Checking BG: Daily. fasting has been ~125 on average, occasionally >200. Was previously holding metformin.  Patient is currently uncontrolled on the following medications:  Marland Kitchen Metformin 500 mg tablet - take 2 tablets by mouth daily with a meal  We discussed: dietary recommendations.  Plan  Continue current medications   COPD   Gold Grade: Gold 2 (FEV1 50-79%).   Lab Results  Component Value Date/Time   EOSPCT 3.1 03/12/2019 01:54 PM   EOSPCT 8.6 (H) 06/30/2014 02:48 PM   Lab Results  Component Value Date/Time   EOSABS 285 03/12/2019 01:54 PM   EOSABS 0.7 (H) 06/30/2014 02:48 PM   Patient is currently taking the following medications:   Tiotropium Bromide Monohydrate (SPIRIVA RESPIMAT) 2.5 MCG/ACT AERS 2 puffs daily   Budesonide-formoterol (SYMBICORT) 160-4.5 MCG/ACT inhale 2 puffs into the  lungs two times daily - rinse after use   Albuterol rescue inhaler 2 puffs every 4 hours as needed  Plan  Continue current medications.  Tobacco Abuse   Tobacco Status:  Social History   Tobacco Use  Smoking Status  Current Every Day Smoker  . Packs/day: 1.50  . Years: 46.00  . Pack years: 69.00  . Types: Cigarettes  Smokeless Tobacco Never Used   Patient smokes {Time to first cigarette:23873} Patient triggers include: {Smoking Triggers:23882} On a scale of 1-10, reports MOTIVATION to quit is *** On a scale of 1-10, reports CONFIDENCE in quitting is ***  Previous quit attempts included: *** Patient is currently {CHL Controlled/Uncontrolled:[phone]} on the following medications:  . ***  We discussed:  {Smoking Cessation Counseling:23883}  Plan  Continue {CHL [facility] Pharmacy [phone]}   Hyperlipidemia   Lipid Panel     Component Value Date/Time   CHOL 135 11/04/2018 1522   TRIG 194.0 (H) 11/04/2018 1522   HDL 34.80 (L) 11/04/2018 1522   [place] 61 11/04/2018 1522   LDLDIRECT 72.0 08/31/2018 1411    The 10-year ASCVD risk score Mikey Bussing DC Jr., et al., 2013) is: 15.9%   Values used to calculate the score:     Age: 85 years     Sex: Female     Is Non-Hispanic African American: No     Diabetic: Yes     Tobacco smoker: Yes     Systolic Blood Pressure: 951 mmHg     Is BP treated: Yes     HDL Cholesterol: 34.8 mg/dL     Total Cholesterol: 135 mg/dL   Denies any muscle or abdominal pain or n/v. Patient is currently controlled on the following medications:  . Atorvastatin 20 mg daily  Plan  Continue current medications.  Hypothyroidism   Lab Results  Component Value Date/Time   TSH 5.85 (H) 08/12/2019 11:58 AM   TSH 4.89 (H) 03/12/2019 01:54 PM   TSH 6.22 (H) 02/18/2019 11:15 AM   TSH slightly elevated 03/2019. Patient is currently near goal on the following medications:  . Levothyroxine 75 mcg daily  Plan  Continue current medications.  GERD   Patient denies dysphagia, heartburn or nausea. Currently controlled on: . Omeprazole 40 mg twice daily   Plan   Continue current medication.  Medication Management   I performed an extensive review of her  medications.   Receives prescription medications from:  [facility] [place] [address] [place] [address] Phone: [phone] Fax: [phone]  [facility]    Denies any issues with current medication management.   Plan  Continue current medication management strategy.  Follow up: 1 month phone visit. Need to discuss COPD, tobacco cessation, anxiety/depression ______________ Visit Information SDOH (Social Determinants of Health) assessments performed: Yes.  Ms. Prest was given information about Chronic Care Management services today including:  1. CCM service includes personalized support from designated clinical staff supervised by her physician, including individualized plan of care and coordination with other care providers 2. 24/7 contact phone numbers for assistance for urgent and routine care needs. 3. Standard insurance, coinsurance, copays and deductibles apply for chronic care management only during months in which we provide at least 20 minutes of these services. Most insurances cover these services at 100%, however patients may be responsible for any copay, coinsurance and/or deductible if applicable. This service may help you avoid the need for more expensive face-to-face services. 4. Only one practitioner  may furnish and bill the service in a calendar month. 5. The patient may stop CCM services at any time (effective at the end of the month) by phone call to the office staff.  Patient agreed to services and verbal consent obtained.   Future Appointments  Date Time Provider [place]  09/30/2019 12:30 PM [facility] CCM PHARMACIST [facility]  09/30/2019  1:00 PM Norman Clay, MD [facility] None  11/12/2019  2:00 PM Cameron Sprang, MD [facility] None  11/24/2019  3:20 PM Milus Banister, MD [facility]    [name], Pharm.D., BCGP Clinical Pharmacist [facility] [phone]

## 2019-09-30 NOTE — Patient Instructions (Signed)
1. Increase venlafaxine 150 mg daily 2. Decrease Xanax 1 mg three times a day as needed for anxiety  3. Referral to therapy  4. Next appointment- 10/14 at 11:30

## 2019-10-01 ENCOUNTER — Other Ambulatory Visit: Payer: Self-pay

## 2019-10-01 ENCOUNTER — Ambulatory Visit: Payer: Medicare Other

## 2019-10-01 DIAGNOSIS — J449 Chronic obstructive pulmonary disease, unspecified: Secondary | ICD-10-CM

## 2019-10-01 DIAGNOSIS — F17210 Nicotine dependence, cigarettes, uncomplicated: Secondary | ICD-10-CM

## 2019-10-01 NOTE — Progress Notes (Signed)
Chronic Care Management Pharmacy  Name: [name]  MRN: [id number] DOB: 1955/07/28  Chief Complaint/ HPI  [name],  64 y.o. , female presents for their Follow-Up CCM visit with the clinical pharmacist via telephone due to COVID-19 Pandemic.   PCP : Brunetta Jeans, PA-C  Encounter Diagnoses  Name Primary?  Marland Kitchen COPD GOLD II still smoking  Yes  . Cigarette smoker      Patient Active Problem List   Diagnosis Date Noted  . Diabetes mellitus, new onset ([place]) 04/12/2019  . Tremor 11/05/2018  . Chronic respiratory failure with hypoxia ([place]) 01/12/2018  . GERD with esophagitis 11/26/2017  . Dysphagia 08/20/2017  . Chronic pain syndrome 08/04/2017  . Comorbid sleep-related hypoventilation 08/04/2017  . OSA (obstructive sleep apnea) 08/04/2017  . Hepatic steatosis 08/04/2017  . Polyp of ascending colon   . Diverticulosis of colon without hemorrhage   . Gastritis and gastroduodenitis   . Hypothyroidism 07/22/2017  . Anxiety and depression 07/22/2017  . Iron deficiency anemia 07/22/2017  . Pilar cyst 07/22/2017  . Morbid obesity due to excess calories ([place]) 03/19/2017  . Anemia 03/19/2017  . COPD GOLD II still smoking  03/18/2017  . Degenerative lumbar spinal stenosis 02/17/2017  . S/P right hip revision 09/02/2016  . S/P hip replacement 09/02/2016  . Instability of prosthetic hip ([place]) 03/01/2016  . Dislocation of hip prosthesis ([place]) 02/17/2016  . Abnormal nuclear stress test: Intermediate Risk 10/31/2015  . Atypical angina ([place]) 10/31/2015  . Trigger finger, acquired 08/20/2012  . Cigarette smoker 12/12/2008  . Hyperlipidemia 11/17/2008  . HYPERTENSION, BENIGN 11/16/2008  . GEN OSTEOARTHROSIS INVOLVING MULTIPLE SITES 11/16/2008   Past Surgical History:  Procedure Laterality Date  . APPENDECTOMY     1985  . BIOPSY  07/24/2017   Procedure: BIOPSY;  Surgeon: Milus Banister, MD;  Location: [facility];  Service: Endoscopy;;  . CARDIAC CATHETERIZATION N/A  10/31/2015   Procedure: Left Heart Cath and Coronary Angiography;  Surgeon: Leonie Man, MD;  Location: [facility];  Service: Cardiovascular;  Laterality: N/A;  . CARPAL TUNNEL RELEASE Left   . CARPAL TUNNEL RELEASE Right   . CHOLECYSTECTOMY  late 1980's  . COLONOSCOPY WITH PROPOFOL N/A 07/24/2017   Procedure: COLONOSCOPY WITH PROPOFOL;  Surgeon: Milus Banister, MD;  Location: [facility];  Service: Endoscopy;  Laterality: N/A;  . ESOPHAGOGASTRODUODENOSCOPY N/A 07/24/2017   Procedure: ESOPHAGOGASTRODUODENOSCOPY (EGD);  Surgeon: Milus Banister, MD;  Location: [facility];  Service: Endoscopy;  Laterality: N/A;  . GALLBLADDER SURGERY  1991  . HIP CLOSED REDUCTION Right 01/08/2016   Procedure: CLOSED MANIPULATION HIP;  Surgeon: Susa Day, MD;  Location: [facility];  Service: Orthopedics;  Laterality: Right;  . HIP CLOSED REDUCTION Right 01/19/2016   Procedure: ATTEMPTED CLOSED REDUCTION RIGHT HIP;  Surgeon: Wylene Simmer, MD;  Location: [facility];  Service: Orthopedics;  Laterality: Right;  . HIP CLOSED REDUCTION Right 01/20/2016   Procedure: CLOSED REDUCTION RIGHT TOTAL HIP;  Surgeon: Paralee Cancel, MD;  Location: [facility];  Service: Orthopedics;  Laterality: Right;  . HIP CLOSED REDUCTION Right 02/17/2016   Procedure: CLOSED REDUCTION RIGHT TOTAL HIP;  Surgeon: Rod Can, MD;  Location: [facility];  Service: Orthopedics;  Laterality: Right;  . HIP CLOSED REDUCTION Right 02/28/2016   Procedure: CLOSED REDUCTION HIP;  Surgeon: Nicholes Stairs, MD;  Location[facility];  Service: Orthopedics;  Laterality: Right;  . POLYPECTOMY  07/24/2017   Procedure: POLYPECTOMY;  Surgeon: Milus Banister, MD;  Location: [facility];  Service: Endoscopy;;  . TONSILLECTOMY    . TOTAL ABDOMINAL HYSTERECTOMY     1985, with 1 ovary removed and 2 nd ovary removed 2003  . TOTAL HIP ARTHROPLASTY Right    Original surgery 2006 with revision 2010  . TOTAL HIP REVISION Right 01/01/2016   Procedure: TOTAL HIP  REVISION;  Surgeon: Paralee Cancel, MD;  Location: [facility];  Service: Orthopedics;  Laterality: Right;  . TOTAL HIP REVISION Right 03/02/2016   Procedure: TOTAL HIP REVISION;  Surgeon: Paralee Cancel, MD;  Location[facility];  Service: Orthopedics;  Laterality: Right;  . TOTAL HIP REVISION Right 09/02/2016   Procedure: Right hip constrained liner- posterior;  Surgeon: Paralee Cancel, MD;  Location: [facility];  Service: Orthopedics;  Laterality: Right;  . ULNAR NERVE TRANSPOSITION Right    Social History   Socioeconomic History  . Marital status: Married    Spouse name: Not on file  . Number of children: Not on file  . Years of education: Not on file  . Highest education level: Not on file  Occupational History  . Not on file  Tobacco Use  . Smoking status: Current Every Day Smoker    Packs/day: 1.50    Years: 46.00    Pack years: 69.00    Types: Cigarettes  . Smokeless tobacco: Never Used  Vaping Use  . Vaping Use: Never used  Substance and Sexual Activity  . Alcohol use: No  . Drug use: No  . Sexual activity: Yes  Other Topics Concern  . Not on file  Social History Narrative   Right handed    Caffeine~ 2 cups per day    Lives at home with husband (strained relationship)   Primary caretaker for disabled brother who had aneurism   Daughter died 2018/06/17    Social Determinants of Health   Financial Resource Strain:   . Difficulty of Paying Living Expenses: Not on file  Food Insecurity: No Food Insecurity  . Worried About Charity fundraiser in the Last Year: Never true  . Ran Out of Food in the Last Year: Never true  Transportation Needs:   . Lack of Transportation (Medical): Not on file  . Lack of Transportation (Non-Medical): Not on file  Physical Activity:   . Days of Exercise per Week: Not on file  . Minutes of Exercise per Session: Not on file  Stress:   . Feeling of Stress : Not on file  Social Connections:   . Frequency of Communication with Friends and Family: Not on  file  . Frequency of Social Gatherings with Friends and Family: Not on file  . Attends Religious Services: Not on file  . Active Member of Clubs or Organizations: Not on file  . Attends Archivist Meetings: Not on file  . Marital Status: Not on file   Family History  Problem Relation Age of Onset  . COPD Mother   . Heart disease Mother   . Lung disease Father        Asbestosis  . Heart attack Father   . Heart disease Father   . Cerebral aneurysm Brother   . Aneurysm Brother        Brain  . Epilepsy Son   . Alcohol abuse Son   . Arthritis Maternal Grandmother   . Heart disease Maternal Grandmother   . Asthma Maternal Grandfather   . Cancer Maternal Grandfather   . Arthritis Paternal Grandmother   . Heart disease  Paternal Grandmother   . Stroke Paternal Grandmother   . Early death Paternal Grandfather   . Heart disease Paternal Grandfather    Allergies  Allergen Reactions  . Nsaids Diarrhea  . Aleve [Naproxen Sodium] Other (See Comments)    Headache   . Codeine Nausea Only and Other (See Comments)    GI upset  . Penicillins Nausea Only and Other (See Comments)    GI upset   . Sulfonamide Derivatives Hives   Outpatient Encounter Medications as of 10/01/2019  Medication Sig Note  . albuterol (PROAIR HFA) 108 (90 Base) MCG/ACT inhaler 2 puffs every 4 hours as needed only  if your can't catch your breath (Patient taking differently: Inhale 2 puffs into the lungs every 4 (four) hours as needed for wheezing or shortness of breath. if your can't catch your breath)   . ALPRAZolam (XANAX) 1 MG tablet TAKE 1 TABLET BY MOUTH EVERY MORNING, AT NOON, EVERY EVENING, AND AT BEDTIME   . atorvastatin (LIPITOR) 20 MG tablet TAKE 1 TABLET BY MOUTH AT BEDTIME   . fluticasone (FLONASE) 50 MCG/ACT nasal spray Place 2 sprays into both nostrils daily.   . furosemide (LASIX) 20 MG tablet Take 1 tablet (20 mg total) by mouth daily as needed.   . gabapentin (NEURONTIN) 300 MG capsule  Take 600 mg by mouth 3 (three) times daily.  12/16/2018: Possibly causing tremors   . glucose blood (ACCU-CHEK GUIDE) test strip 1 each by Other route 3 (three) times daily as needed for other. as directed Dx E11.9   . HYDROcodone-acetaminophen (NORCO) 10-325 MG tablet Take 1 tablet by mouth every 8 (eight) hours as needed.   Marland Kitchen JANUVIA 25 MG tablet TAKE 1 TABLET BY MOUTH DAILY   . levothyroxine (SYNTHROID) 100 MCG tablet TAKE 1 TABLET BY MOUTH EVERY DAY BEFORE BREAKFAST   . loratadine (CLARITIN) 10 MG tablet Take 10 mg by mouth daily.   . meloxicam (MOBIC) 7.5 MG tablet Take 1 tablet (7.5 mg total) by mouth daily.   . metFORMIN (GLUCOPHAGE-XR) 500 MG 24 hr tablet TAKE 2 TABLETS BY MOUTH  EVERY DAY   . nystatin (MYCOSTATIN/NYSTOP) powder APPLY TO THE AFFECTED AREA(S) TWICE DAILY   . omeprazole (PRILOSEC) 40 MG capsule TAKE ONE CAPSULE BY MOUTH DAILY BEFORE BREAKFAST and TAKE ONE CAPSULE DAILY BEFORE DINNER   . ondansetron (ZOFRAN) 4 MG tablet Take 1 tablet (4 mg total) by mouth every 8 (eight) hours as needed for nausea or vomiting.   . OXYGEN Inhale 2 L into the lungs at bedtime.    . potassium chloride (KLOR-CON) 10 MEQ tablet Take 1 tablet (10 mEq total) by mouth at bedtime.   . SYMBICORT 160-4.5 MCG/ACT inhaler USE 2 INHALATIONS BY MOUTH  TWICE DAILY   . Tiotropium Bromide Monohydrate (SPIRIVA RESPIMAT) 2.5 MCG/ACT AERS Inhale 2 puffs into the lungs daily.   Marland Kitchen triamcinolone cream (KENALOG) 0.1 % Apply 1 application topically daily.   Marland Kitchen triamterene-hydrochlorothiazide (MAXZIDE-25) 37.5-25 MG tablet Take 1 tablet by mouth daily.   Marland Kitchen venlafaxine XR (EFFEXOR-XR) 150 MG 24 hr capsule Take 1 capsule (150 mg total) by mouth daily with breakfast.    No facility-administered encounter medications on file as of 10/01/2019.   Patient Care Team    Relationship Specialty Notifications Start End  Brunetta Jeans, [place] PCP - General Family Medicine  07/18/17   Milus Banister, MD Attending Physician  Gastroenterology  07/18/17   Tanda Rockers, MD Consulting Physician Pulmonary Disease  07/18/17  Dr. Retta Mac    07/18/17    Comment: Dentist  Paralee Cancel, MD Consulting Physician Orthopedic Surgery  07/18/17   Latanya Maudlin, MD Consulting Physician Orthopedic Surgery  07/18/17   Kathrynn Ducking, MD Consulting Physician Neurology  12/16/18   Okey Regal, [place] Physician Optometry  12/16/18    Comment: Richarda Osmond, [facility] Pharmacist Pharmacist  05/24/19    Comment: PHONE NUMBER [phone]  Deloria Lair, [place] Management  Admissions 07/09/19    Current Diagnosis/Assessment: Goals Addressed            This Visit's Progress   . PharmD Care Plan   On track    CARE PLAN ENTRY (see longitudinal plan of care for additional care plan information)  Current Barriers:  . Chronic Disease Management support, education, and care coordination needs related to Hypertension, Hyperlipidemia, Diabetes, and GERD   Hypertension BP Readings from Last 3 Encounters:  08/05/19 126/78  07/27/19 132/76  07/09/19 130/82   . Pharmacist Clinical Goal(s):  o Over the next 180 days, patient will work with PharmD and providers to maintain BP goal <140/90 . Current regimen:  o Triamterence-hydrochlorothiazide 37.5-25 mg once daily . Interventions: o Continue current management . Patient self care activities - Over the next 180 days, patient will: o Check BP once every 1-2 weeks, document, and provide at future appointments o Ensure daily salt intake < 2300 mg/day  Hyperlipidemia Lab Results  Component Value Date/Time   LDLCALC 61 11/04/2018 03:22 PM   LDLDIRECT 72.0 08/31/2018 02:11 PM   . Pharmacist Clinical Goal(s): o Over the next 180 days, patient will work with PharmD and providers to maintain LDL goal < 100 . Current regimen:  o Atorvastatin 20 mg daily  . Interventions: o Continue current management . Patient self care activities -  Over the next 180 days, patient will: o Continue current management  Diabetes Lab Results  Component Value Date/Time   HGBA1C 9.3 (A) 03/22/2019 02:44 PM   . Pharmacist Clinical Goal(s): o Over the next 180 days, patient will work with PharmD and providers to achieve A1c goal <7% . Current regimen:  o Metformin 500 mg tablet - take two tablets every morning with breakfast . Interventions: o Continue current management o Limit carbohydrates in diet . Patient self care activities - Over the next 180 days, patient will: o Check blood sugar once daily, document, and provide at future appointments o Contact provider with any episodes of hypoglycemia  GERD . Pharmacist Clinical Goal(s) o Over the next 180 days, patient will work with PharmD and providers to minimize GERD related symptoms . Current regimen:  o Omeprazole 40 mg twice daily  . Interventions: o Continue current management . Patient self care activities - Over the next 180 days, patient will: o Continue current management  Medication management . Pharmacist Clinical Goal(s): o Over the next 180 days, patient will work with PharmD and providers to maintain optimal medication adherence . Current pharmacy: [facility] . Interventions o Comprehensive medication review performed. o Continue current medication management strategy . Patient self care activities - Over the next 180 days, patient will: o Take medications as prescribed o Report any questions or concerns to PharmD and/or provider(s)  Initial goal documentation       Hypertension   BP Readings from Last 3 Encounters:  08/12/19 110/70  08/05/19 126/78  07/27/19 132/76   BP controlled at recent visits. Denies dizziness, SOB, chest  pain. Patient checks BP at home infrequently. Patient is currently controlled on the following medications:  . Triamterene-hydrochlorothiazide 37.5-25 mg once daily  Plan  Continue current medications.    Diabetes    Recent Relevant Labs:  Lab Results  Component Value Date/Time   HGBA1C 7.0 (H) 08/12/2019 11:58 AM   HGBA1C 9.3 (A) 03/22/2019 02:44 PM   EGFR 60 (L) 06/30/2014 02:48 PM   MICROALBUR 0.4 03/12/2019 01:54 PM    Patient is currently controlled on the following medications:  Marland Kitchen Metformin 500 mg tablet - take 2 tablets by mouth daily with a meal  We discussed: dietary recommendations.  Plan  Continue current medications   COPD   Gold Grade: Gold 2 (FEV1 50-79%).   Lab Results  Component Value Date/Time   EOSPCT 3.1 03/12/2019 01:54 PM   EOSPCT 8.6 (H) 06/30/2014 02:48 PM   Lab Results  Component Value Date/Time   EOSABS 285 03/12/2019 01:54 PM   EOSABS 0.7 (H) 06/30/2014 02:48 PM   Patient is currently taking the following medications:   Tiotropium Bromide Monohydrate (SPIRIVA RESPIMAT) 2.5 MCG/ACT AERS 2 puffs daily   Budesonide-formoterol (SYMBICORT) 160-4.5 MCG/ACT inhale 2 puffs into the lungs two times daily - rinse after use   Albuterol rescue inhaler 2 puffs every 4 hours as needed  We discussed: PAP for inhalers.  Plan  Continue current medications.  Tobacco Abuse   Tobacco Status:  Social History   Tobacco Use  Smoking Status Current Every Day Smoker  . Packs/day: 1.50  . Years: 46.00  . Pack years: 69.00  . Types: Cigarettes  Smokeless Tobacco Never Used   1-1.5 ppd currently, does not chew gum, has tried a patch. Previously tried cold [place]. Patient smokes Within 30 minutes of waking. Patient triggers include: stress. Not motivated to quit at this time.   We discussed: Smoking cessation options.  Plan  Discuss at 33-monthf/u.  Hyperlipidemia   Lipid Panel     Component Value Date/Time   CHOL 135 11/04/2018 1522   TRIG 194.0 (H) 11/04/2018 1522   HDL 34.80 (L) 11/04/2018 1522   LDLCALC 61 11/04/2018 1522   LDLDIRECT 72.0 08/31/2018 1411    The 10-year ASCVD risk score (Mikey BussingDC Jr., et al., 2013) is: 15.9%   Values used to  calculate the score:     Age: [age]years     Sex: Female     Is Non-Hispanic African American: No     Diabetic: Yes     Tobacco smoker: Yes     Systolic Blood Pressure: 1270mmHg     Is BP treated: Yes     HDL Cholesterol: 34.8 mg/dL     Total Cholesterol: 135 mg/dL   Denies any muscle or abdominal pain or n/v. Patient is currently controlled on the following medications:  . Atorvastatin 20 mg daily  Plan  Continue current medications.  Hypothyroidism   Lab Results  Component Value Date/Time   TSH 5.85 (H) 08/12/2019 11:58 AM   TSH 4.89 (H) 03/12/2019 01:54 PM   TSH 6.22 (H) 02/18/2019 11:15 AM   Currently taking: . Levothyroxine 75 mcg daily  Plan  Continue current medications.  GERD   Patient denies acid reflux. Currently controlled on: . Omeprazole 40 mg twice daily   Plan   Continue current medication.  Medication Management   I performed an extensive review of her medications.  Receives prescription medications from:  [facility]  [place] [address] Phone: [phone] Fax: [phone]  [facility]    Denies any issues with current medication management.   Plan  Continue current medication management strategy.  Follow up:  RPH: 2 month phone visit. Review insurance options for smoking cessation.  CPA: Send pt PAP applications on Albuterol, Symbicort, Spiriva inhalers. ______________ Visit Information SDOH (Social Determinants of Health) assessments performed: Yes.  Future Appointments  Date Time Provider [place]  11/12/2019  2:00 PM Cameron Sprang, MD [facility] None  11/24/2019  3:20 PM Milus Banister, MD [facility]  12/02/2019 11:00 AM [facility] PHARMACIST [facility]   [name], Pharm.D., BCGP Clinical Pharmacist [facility] [phone]

## 2019-10-05 NOTE — Patient Instructions (Addendum)
Hi Cynara,  Please let me know if you have any questions on the patient assistance applications for your inhalers. These applications will possibly help with medication costs. You will be receiving them by mail in a separate envelope.   I can be reached at [phone] with any questions.  Thank you, Edyth Gunnels., Clinical Pharmacist  [name], Pharm.D., BCGP Clinical Pharmacist [facility]  [phone]  Goals Addressed            This Visit's Progress   . PharmD Care Plan   On track    CARE PLAN ENTRY (see longitudinal plan of care for additional care plan information)  Current Barriers:  . Chronic Disease Management support, education, and care coordination needs related to Hypertension, Hyperlipidemia, Diabetes, and GERD   Hypertension BP Readings from Last 3 Encounters:  08/05/19 126/78  07/27/19 132/76  07/09/19 130/82   . Pharmacist Clinical Goal(s):  o Over the next 180 days, patient will work with PharmD and providers to maintain BP goal <140/90 . Current regimen:  o Triamterence-hydrochlorothiazide 37.5-25 mg once daily . Interventions: o Continue current management . Patient self care activities - Over the next 180 days, patient will: o Check BP once every 1-2 weeks, document, and provide at future appointments o Ensure daily salt intake < 2300 mg/day  Hyperlipidemia Lab Results  Component Value Date/Time   LDLCALC 61 11/04/2018 03:22 PM   LDLDIRECT 72.0 08/31/2018 02:11 PM   . Pharmacist Clinical Goal(s): o Over the next 180 days, patient will work with PharmD and providers to maintain LDL goal < 100 . Current regimen:  o Atorvastatin 20 mg daily  . Interventions: o Continue current management . Patient self care activities - Over the next 180 days, patient will: o Continue current management  Diabetes Lab Results  Component Value Date/Time   HGBA1C 9.3 (A) 03/22/2019 02:44 PM   . Pharmacist Clinical  Goal(s): o Over the next 180 days, patient will work with PharmD and providers to achieve A1c goal <7% . Current regimen:  o Metformin 500 mg tablet - take two tablets every morning with breakfast . Interventions: o Continue current management o Limit carbohydrates in diet . Patient self care activities - Over the next 180 days, patient will: o Check blood sugar once daily, document, and provide at future appointments o Contact provider with any episodes of hypoglycemia  GERD . Pharmacist Clinical Goal(s) o Over the next 180 days, patient will work with PharmD and providers to minimize GERD related symptoms . Current regimen:  o Omeprazole 40 mg twice daily  . Interventions: o Continue current management . Patient self care activities - Over the next 180 days, patient will: o Continue current management  Medication management . Pharmacist Clinical Goal(s): o Over the next 180 days, patient will work with PharmD and providers to maintain optimal medication adherence . Current pharmacy: [facility] . Interventions o Comprehensive medication review performed. o Continue current medication management strategy . Patient self care activities - Over the next 180 days, patient will: o Take medications as prescribed o Report any questions or concerns to PharmD and/or provider(s)  Initial goal documentation       [name], Pharm.D., BCGP Clinical Pharmacist [facility] [phone]

## 2019-10-06 ENCOUNTER — Telehealth: Payer: Self-pay

## 2019-10-06 NOTE — Progress Notes (Signed)
Chronic Care Management Pharmacy Assistant   Name: [name]  MRN: [id number] DOB: 31-Jan-1955  PCP : Brunetta Jeans, PA-C  Allergies:   Allergies  Allergen Reactions  . Nsaids Diarrhea  . Aleve [Naproxen Sodium] Other (See Comments)    Headache   . Codeine Nausea Only and Other (See Comments)    GI upset  . Penicillins Nausea Only and Other (See Comments)    GI upset   . Sulfonamide Derivatives Hives    Medications: Outpatient Encounter Medications as of 10/06/2019  Medication Sig Note  . albuterol (PROAIR HFA) 108 (90 Base) MCG/ACT inhaler 2 puffs every 4 hours as needed only  if your can't catch your breath (Patient taking differently: Inhale 2 puffs into the lungs every 4 (four) hours as needed for wheezing or shortness of breath. if your can't catch your breath)   . ALPRAZolam (XANAX) 1 MG tablet TAKE 1 TABLET BY MOUTH EVERY MORNING, AT NOON, EVERY EVENING, AND AT BEDTIME   . atorvastatin (LIPITOR) 20 MG tablet TAKE 1 TABLET BY MOUTH AT BEDTIME   . fluticasone (FLONASE) 50 MCG/ACT nasal spray Place 2 sprays into both nostrils daily.   . furosemide (LASIX) 20 MG tablet Take 1 tablet (20 mg total) by mouth daily as needed.   . gabapentin (NEURONTIN) 300 MG capsule Take 600 mg by mouth 3 (three) times daily.  12/16/2018: Possibly causing tremors   . glucose blood (ACCU-CHEK GUIDE) test strip 1 each by Other route 3 (three) times daily as needed for other. as directed Dx E11.9   . HYDROcodone-acetaminophen (NORCO) 10-325 MG tablet Take 1 tablet by mouth every 8 (eight) hours as needed.   Marland Kitchen JANUVIA 25 MG tablet TAKE 1 TABLET BY MOUTH DAILY   . levothyroxine (SYNTHROID) 100 MCG tablet TAKE 1 TABLET BY MOUTH EVERY DAY BEFORE BREAKFAST   . loratadine (CLARITIN) 10 MG tablet Take 10 mg by mouth daily.   . meloxicam (MOBIC) 7.5 MG tablet Take 1 tablet (7.5 mg total) by mouth daily.   . metFORMIN (GLUCOPHAGE-XR) 500 MG 24 hr tablet TAKE 2 TABLETS BY MOUTH  EVERY DAY   .  nystatin (MYCOSTATIN/NYSTOP) powder APPLY TO THE AFFECTED AREA(S) TWICE DAILY   . omeprazole (PRILOSEC) 40 MG capsule TAKE ONE CAPSULE BY MOUTH DAILY BEFORE BREAKFAST and TAKE ONE CAPSULE DAILY BEFORE DINNER   . ondansetron (ZOFRAN) 4 MG tablet Take 1 tablet (4 mg total) by mouth every 8 (eight) hours as needed for nausea or vomiting.   . OXYGEN Inhale 2 L into the lungs at bedtime.    . potassium chloride (KLOR-CON) 10 MEQ tablet Take 1 tablet (10 mEq total) by mouth at bedtime.   . SYMBICORT 160-4.5 MCG/ACT inhaler USE 2 INHALATIONS BY MOUTH  TWICE DAILY   . Tiotropium Bromide Monohydrate (SPIRIVA RESPIMAT) 2.5 MCG/ACT AERS Inhale 2 puffs into the lungs daily.   Marland Kitchen triamcinolone cream (KENALOG) 0.1 % Apply 1 application topically daily.   Marland Kitchen triamterene-hydrochlorothiazide (MAXZIDE-25) 37.5-25 MG tablet Take 1 tablet by mouth daily.   Marland Kitchen venlafaxine XR (EFFEXOR-XR) 150 MG 24 hr capsule Take 1 capsule (150 mg total) by mouth daily with breakfast.    No facility-administered encounter medications on file as of 10/06/2019.    Current Diagnosis: Patient Active Problem List   Diagnosis Date Noted  . Diabetes mellitus, new onset ([place]) 04/12/2019  . Tremor 11/05/2018  . Chronic respiratory failure with hypoxia ([place]) 01/12/2018  . GERD with esophagitis 11/26/2017  .  Dysphagia 08/20/2017  . Chronic pain syndrome 08/04/2017  . Comorbid sleep-related hypoventilation 08/04/2017  . OSA (obstructive sleep apnea) 08/04/2017  . Hepatic steatosis 08/04/2017  . Polyp of ascending colon   . Diverticulosis of colon without hemorrhage   . Gastritis and gastroduodenitis   . Hypothyroidism 07/22/2017  . Anxiety and depression 07/22/2017  . Iron deficiency anemia 07/22/2017  . Pilar cyst 07/22/2017  . Morbid obesity due to excess calories ([place]) 03/19/2017  . Anemia 03/19/2017  . COPD GOLD II still smoking  03/18/2017  . Degenerative lumbar spinal stenosis 02/17/2017  . S/P right hip revision 09/02/2016  .  S/P hip replacement 09/02/2016  . Instability of prosthetic hip ([place]) 03/01/2016  . Dislocation of hip prosthesis ([place]) 02/17/2016  . Abnormal nuclear stress test: Intermediate Risk 10/31/2015  . Atypical angina ([place]) 10/31/2015  . Trigger finger, acquired 08/20/2012  . Cigarette smoker 12/12/2008  . Hyperlipidemia 11/17/2008  . HYPERTENSION, BENIGN 11/16/2008  . GEN OSTEOARTHROSIS INVOLVING MULTIPLE SITES 11/73/5670    PAP applications for Symbicort, ProAir/Ventolin and spiriva was prefilled and mailed to patient. Spoke with patient and she is aware that she will be receiving application in the mail and is aware that she has to mail back with given envelope and stamp .  Georgiana Shore , [place] Pharmacist Assistant [phone]

## 2019-10-09 ENCOUNTER — Other Ambulatory Visit: Payer: Self-pay | Admitting: Physician Assistant

## 2019-10-11 NOTE — Telephone Encounter (Signed)
Xanax last rx 09/10/19 #120 LOV:09/22/19 Rash CSC: 08/18/17

## 2019-10-18 ENCOUNTER — Other Ambulatory Visit: Payer: Self-pay | Admitting: Physician Assistant

## 2019-10-18 DIAGNOSIS — G894 Chronic pain syndrome: Secondary | ICD-10-CM

## 2019-10-18 NOTE — Telephone Encounter (Signed)
Hydrocodone LFD 09/24/19 #90 with no refills LOV 09/22/19 NOV none

## 2019-10-19 ENCOUNTER — Other Ambulatory Visit: Payer: Self-pay

## 2019-10-19 ENCOUNTER — Encounter: Payer: Self-pay | Admitting: Neurology

## 2019-10-19 ENCOUNTER — Ambulatory Visit: Payer: Medicare Other | Admitting: Neurology

## 2019-10-19 VITALS — BP 117/67 | HR 98 | Ht 62.0 in | Wt 222.6 lb

## 2019-10-19 DIAGNOSIS — R413 Other amnesia: Secondary | ICD-10-CM | POA: Diagnosis not present

## 2019-10-19 NOTE — Progress Notes (Signed)
NEUROLOGY CONSULTATION NOTE  IVIANA [name] MRN: [id number] DOB: [birth year]  Referring provider: Raiford Noble, PA-C Primary care provider: Raiford Noble, PA-C  Reason for consult:  Memory changes, visual hallucinations   Thank you for your kind referral of [name] for consultation of the above symptoms. Although her history is well known to you, please allow me to reiterate it for the purpose of our medical record. The patient was accompanied to the clinic by her husband who also provides collateral information. Records and images were personally reviewed where available.   HISTORY OF PRESENT ILLNESS: This is a 64 year old right-handed woman with a history of hyperlipidemia, hypertension, diabetes, migraines, depression, anxiety, COPD, chronic pain, presenting for evaluation of memory changes and visual hallucinations. She was in the ER in June 2021 for intermittent auditory and visual hallucinations, mild confusion for 2-3 months. She had a head CT which I personally reviewed, no acute changes, there was mild diffuse atrophy and chronic microvascular disease. She was treated for a UTI. Despite treatment of UTI, she continued to report visual hallucinations, seeing a young girl in the house. There was concern that Cymbalta was causing this and she was switched to Venlafaxine. On her visit with Psychiatry this month, she denied any hallucinations and venlafaxine dose was increased for depression. She feels her memory is better but she is still forgetful. She recalls the events in June, she became tearful, stating it was a hard time, she thought her husband was cheating on her and she could hear a woman in the house. She then had a fall leading to the ER visit. Her husband reports memory changes started around June and that it is a whole lot better now. He states there are no further hallucinations. They report that since changing oxycodone to hydrocodone, this has helped "100%." She  manages finances and denies forgetting to pay them. Her husband fixes her pillbox, she forgets her night medications but states this is not very often. She does not drive. Her husband manages meals. She is independent with dressing and bathing, but is fearful of falling in the shower since she has had 2 falls in the shower so her husband stays close by. She became tearful several times, saying there is "a whole lot on me." She is her brother's POA and "he waits until last minute." Her father passed away in 2010/04/26. Her daughter overdosed in 04/25/2017, and her son is "in and out, and won't be coming back." Her 30 year old grandson living with them is now "falling into the same pattern," she states this is all too much.   She has a history of migraines. She has had left eyelid twitching, yesterday it was all day and usually a sign a migraine is coming on. She has a little dizziness with standing. She has occasional swallowing difficulties and has to eat slowly. She has chronic back pain. She has tremors in both hands, her feet feel cold. She has urinary incontinence. She denies any diplopia, focal numbness/tingling/weakness, bowel dysfunction, anosmia. She usually wakes up a few times to urinate. She does not recall when her last fall occurred. Her maternal grandmother had Alzheimer's disease. Her brother has memory loss from a brain aneurysm. She has 2 paternal uncles and a maternal uncle with aneurysm. No history of significant head injuries or alcohol use.  Laboratory Data: Lab Results  Component Value Date   TSH 5.85 (H) 08/12/2019   Lab Results  Component Value Date  UKGURKYH06 440 08/12/2019     PAST MEDICAL HISTORY: Past Medical History:  Diagnosis Date  . Allergic rhinitis   . Anemia   . Anxiety   . Chicken pox   . Chronic back pain   . COPD (chronic obstructive pulmonary disease) ([place])   . Depression   . Essential hypertension   . GERD (gastroesophageal reflux disease)   . Headache     migraines  . History of gastritis    EGD 2015  . History of home oxygen therapy    2 liters at hs last 6 months  . Hyperlipidemia   . Hypothyroidism   . Migraines   . Osteoarthritis    oa  . Scoliosis     PAST SURGICAL HISTORY: Past Surgical History:  Procedure Laterality Date  . APPENDECTOMY     1985  . BIOPSY  07/24/2017   Procedure: BIOPSY;  Surgeon: Milus Banister, MD;  Location: [facility];  Service: Endoscopy;;  . CARDIAC CATHETERIZATION N/A 10/31/2015   Procedure: Left Heart Cath and Coronary Angiography;  Surgeon: Leonie Man, MD;  Location: [facility];  Service: Cardiovascular;  Laterality: N/A;  . CARPAL TUNNEL RELEASE Left   . CARPAL TUNNEL RELEASE Right   . CHOLECYSTECTOMY  late 1980's  . COLONOSCOPY WITH PROPOFOL N/A 07/24/2017   Procedure: COLONOSCOPY WITH PROPOFOL;  Surgeon: Milus Banister, MD;  Location: [facility];  Service: Endoscopy;  Laterality: N/A;  . ESOPHAGOGASTRODUODENOSCOPY N/A 07/24/2017   Procedure: ESOPHAGOGASTRODUODENOSCOPY (EGD);  Surgeon: Milus Banister, MD;  Location: [facility];  Service: Endoscopy;  Laterality: N/A;  . GALLBLADDER SURGERY  1991  . HIP CLOSED REDUCTION Right 01/08/2016   Procedure: CLOSED MANIPULATION HIP;  Surgeon: Susa Day, MD;  Location: [facility];  Service: Orthopedics;  Laterality: Right;  . HIP CLOSED REDUCTION Right 01/19/2016   Procedure: ATTEMPTED CLOSED REDUCTION RIGHT HIP;  Surgeon: Wylene Simmer, MD;  Location: [facility];  Service: Orthopedics;  Laterality: Right;  . HIP CLOSED REDUCTION Right 01/20/2016   Procedure: CLOSED REDUCTION RIGHT TOTAL HIP;  Surgeon: Paralee Cancel, MD;  Location: [facility];  Service: Orthopedics;  Laterality: Right;  . HIP CLOSED REDUCTION Right 02/17/2016   Procedure: CLOSED REDUCTION RIGHT TOTAL HIP;  Surgeon: Rod Can, MD;  Location: [facility];  Service: Orthopedics;  Laterality: Right;  . HIP CLOSED REDUCTION Right 02/28/2016   Procedure: CLOSED REDUCTION HIP;  Surgeon:  Nicholes Stairs, MD;  Location: [facility];  Service: Orthopedics;  Laterality: Right;  . POLYPECTOMY  07/24/2017   Procedure: POLYPECTOMY;  Surgeon: Milus Banister, MD;  Location: [facility];  Service: Endoscopy;;  . TONSILLECTOMY    . TOTAL ABDOMINAL HYSTERECTOMY     1985, with 1 ovary removed and 2 nd ovary removed 2003  . TOTAL HIP ARTHROPLASTY Right    Original surgery 2006 with revision 2010  . TOTAL HIP REVISION Right 01/01/2016   Procedure: TOTAL HIP REVISION;  Surgeon: Paralee Cancel, MD;  Location: [facility];  Service: Orthopedics;  Laterality: Right;  . TOTAL HIP REVISION Right 03/02/2016   Procedure: TOTAL HIP REVISION;  Surgeon: Paralee Cancel, MD;  Location: [facility];  Service: Orthopedics;  Laterality: Right;  . TOTAL HIP REVISION Right 09/02/2016   Procedure: Right hip constrained liner- posterior;  Surgeon: Paralee Cancel, MD;  Location: [facility];  Service: Orthopedics;  Laterality: Right;  . ULNAR NERVE TRANSPOSITION Right     MEDICATIONS: Current Outpatient Medications on File Prior to Visit  Medication Sig Dispense Refill  .  albuterol (PROAIR HFA) 108 (90 Base) MCG/ACT inhaler 2 puffs every 4 hours as needed only  if your can't catch your breath (Patient taking differently: Inhale 2 puffs into the lungs every 4 (four) hours as needed for wheezing or shortness of breath. if your can't catch your breath) 6.7 g 0  . ALPRAZolam (XANAX) 1 MG tablet TAKE 1 TABLET BY MOUTH EVERY MORNING, AT NOON, EVERY EVENING, AND AT BEDTIME 120 tablet 0  . atorvastatin (LIPITOR) 20 MG tablet TAKE 1 TABLET BY MOUTH AT BEDTIME 90 tablet 1  . fluticasone (FLONASE) 50 MCG/ACT nasal spray Place 2 sprays into both nostrils daily. 16 g 6  . furosemide (LASIX) 20 MG tablet Take 1 tablet (20 mg total) by mouth daily as needed. 90 tablet 0  . gabapentin (NEURONTIN) 300 MG capsule Take 600 mg by mouth 3 (three) times daily.     Marland Kitchen glucose blood (ACCU-CHEK GUIDE) test strip 1 each by Other route 3 (three) times daily  as needed for other. as directed Dx E11.9 100 strip 11  . HYDROcodone-acetaminophen (NORCO) 10-325 MG tablet TAKE 1 TABLET BY MOUTH EVERY 8 HOURS AS NEEDED 90 tablet 0  . JANUVIA 25 MG tablet TAKE 1 TABLET BY MOUTH DAILY 30 tablet 1  . levothyroxine (SYNTHROID) 100 MCG tablet TAKE 1 TABLET BY MOUTH EVERY DAY BEFORE BREAKFAST 90 tablet 1  . loratadine (CLARITIN) 10 MG tablet Take 10 mg by mouth daily.    . meloxicam (MOBIC) 7.5 MG tablet Take 1 tablet (7.5 mg total) by mouth daily. 10 tablet 0  . metFORMIN (GLUCOPHAGE-XR) 500 MG 24 hr tablet TAKE 2 TABLETS BY MOUTH  EVERY DAY 180 tablet 3  . nystatin (MYCOSTATIN/NYSTOP) powder APPLY TO THE AFFECTED AREA(S) TWICE DAILY 15 g 0  . omeprazole (PRILOSEC) 40 MG capsule TAKE ONE CAPSULE BY MOUTH DAILY BEFORE BREAKFAST and TAKE ONE CAPSULE DAILY BEFORE DINNER 60 capsule 2  . ondansetron (ZOFRAN) 4 MG tablet Take 1 tablet (4 mg total) by mouth every 8 (eight) hours as needed for nausea or vomiting. 10 tablet 0  . OXYGEN Inhale 2 L into the lungs at bedtime.     . potassium chloride (KLOR-CON) 10 MEQ tablet Take 1 tablet (10 mEq total) by mouth at bedtime. 90 tablet 0  . SYMBICORT 160-4.5 MCG/ACT inhaler USE 2 INHALATIONS BY MOUTH  TWICE DAILY 30.6 g 3  . Tiotropium Bromide Monohydrate (SPIRIVA RESPIMAT) 2.5 MCG/ACT AERS Inhale 2 puffs into the lungs daily. 12 g 0  . triamcinolone cream (KENALOG) 0.1 % Apply 1 application topically daily. 80 g 0  . triamterene-hydrochlorothiazide (MAXZIDE-25) 37.5-25 MG tablet Take 1 tablet by mouth daily. 90 tablet 2  . venlafaxine XR (EFFEXOR-XR) 150 MG 24 hr capsule Take 1 capsule (150 mg total) by mouth daily with breakfast. 30 capsule 1   No current facility-administered medications on file prior to visit.    ALLERGIES: Allergies  Allergen Reactions  . Nsaids Diarrhea  . Aleve [Naproxen Sodium] Other (See Comments)    Headache   . Codeine Nausea Only and Other (See Comments)    GI upset  . Penicillins Nausea  Only and Other (See Comments)    GI upset   . Sulfonamide Derivatives Hives    FAMILY HISTORY: Family History  Problem Relation Age of Onset  . COPD Mother   . Heart disease Mother   . Lung disease Father        Asbestosis  . Heart attack Father   .  Heart disease Father   . Cerebral aneurysm Brother   . Aneurysm Brother        Brain  . Epilepsy Son   . Alcohol abuse Son   . Arthritis Maternal Grandmother   . Heart disease Maternal Grandmother   . Asthma Maternal Grandfather   . Cancer Maternal Grandfather   . Arthritis Paternal Grandmother   . Heart disease Paternal Grandmother   . Stroke Paternal Grandmother   . Early death Paternal Grandfather   . Heart disease Paternal Grandfather     SOCIAL HISTORY: Social History   Socioeconomic History  . Marital status: Married    Spouse name: Not on file  . Number of children: Not on file  . Years of education: Not on file  . Highest education level: Not on file  Occupational History  . Not on file  Tobacco Use  . Smoking status: Current Every Day Smoker    Packs/day: 1.50    Years: 46.00    Pack years: 69.00    Types: Cigarettes  . Smokeless tobacco: Never Used  Vaping Use  . Vaping Use: Never used  Substance and Sexual Activity  . Alcohol use: No  . Drug use: No  . Sexual activity: Yes  Other Topics Concern  . Not on file  Social History Narrative   Right handed    Caffeine~ 2 cups per day    Lives at home with husband (strained relationship)   Primary caretaker for disabled brother who had aneurism   Daughter died 2018/06/26    Social Determinants of Health   Financial Resource Strain:   . Difficulty of Paying Living Expenses: Not on file  Food Insecurity: No Food Insecurity  . Worried About Charity fundraiser in the Last Year: Never true  . Ran Out of Food in the Last Year: Never true  Transportation Needs:   . Lack of Transportation (Medical): Not on file  . Lack of Transportation (Non-Medical): Not  on file  Physical Activity:   . Days of Exercise per Week: Not on file  . Minutes of Exercise per Session: Not on file  Stress:   . Feeling of Stress : Not on file  Social Connections:   . Frequency of Communication with Friends and Family: Not on file  . Frequency of Social Gatherings with Friends and Family: Not on file  . Attends Religious Services: Not on file  . Active Member of Clubs or Organizations: Not on file  . Attends Archivist Meetings: Not on file  . Marital Status: Not on file  Intimate Partner Violence:   . Fear of Current or Ex-Partner: Not on file  . Emotionally Abused: Not on file  . Physically Abused: Not on file  . Sexually Abused: Not on file     PHYSICAL EXAM: Vitals:   10/19/19 1308  BP: 117/67  Pulse: 98  SpO2: 93%   General: No acute distress Head:  Normocephalic/atraumatic Skin/Extremities: No rash, no edema Neurological Exam: Mental status: alert and oriented to person, place, and time, no dysarthria or aphasia, Fund of knowledge is appropriate.  Recent and remote memory are intact.  Attention and concentration are reduced. MOCA score 24/30 Montreal Cognitive Assessment  10/19/2019  Visuospatial/ Executive (0/5) 3  Naming (0/3) 3  Attention: Read list of digits (0/2) 2  Attention: Read list of letters (0/1) 1  Attention: Serial 7 subtraction starting at 100 (0/3) 1  Language: Repeat phrase (0/2) 2  Language :  Fluency (0/1) 1  Abstraction (0/2) 1  Delayed Recall (0/5) 5  Orientation (0/6) 5  Total 24  Adjusted Score (based on education) 24   Cranial nerves: CN I: not tested CN II: pupils equal, round and reactive to light, visual fields intact CN III, IV, VI:  full range of motion, no nystagmus, no ptosis CN V: facial sensation intact CN VII: upper and lower face symmetric CN VIII: hearing intact to conversation CN IX, X: gag intact, uvula midline CN XI: sternocleidomastoid and trapezius muscles intact CN XII: tongue  midline Bulk & Tone: normal, no fasciculations. Motor: 5/5 throughout with no pronator drift. Sensation: intact to light touch, cold, pin on both UE, decreased pin on left LE. Romberg test negative Deep Tendon Reflexes: +1 throughout, no ankle clonus Plantar responses: downgoing bilaterally Cerebellar: no incoordination on finger to nose testing Gait: slow and cautious due to left knee pain Tremor: none   IMPRESSION: This is a 64 year old right-handed woman with a history of hyperlipidemia, hypertension, diabetes, migraines, depression, anxiety, COPD, chronic pain, presenting for evaluation of memory changes and visual hallucinations. Her neurological exam is non-focal, MOCA score today 24/30. Symptoms started around June 2021, head CT unremarkable. They both report improvement since then, no further hallucinations. At that time she was treated for a UTI and several medications changes were made, which appears to have helped a lot. She continues to report forgetfulness but no significant difficulties with complex tasks. We discussed different causes of memory changes, I suspect mood/stress, pain are the main cause of her cognitive changes, she will be scheduled for Neurocognitive testing. Continue follow-up with Psychiatry, she was encouraged to proceed with psychotherapy. Follow-up as needed, call for any changes.   Thank you for allowing me to participate in the care of this patient. Please do not hesitate to call for any questions or concerns.   Ellouise Newer, M.D.  CC: Raiford Noble, PA-C

## 2019-10-19 NOTE — Patient Instructions (Signed)
Good to meet you. We will schedule you for the Neurocognitive testing to further evaluate cause of memory changes. Continue working with Psychiatry and proceeding with therapy. Continue pain management with PCP.

## 2019-10-20 ENCOUNTER — Ambulatory Visit: Payer: Medicare Other | Attending: Internal Medicine

## 2019-10-20 DIAGNOSIS — Z23 Encounter for immunization: Secondary | ICD-10-CM

## 2019-10-20 NOTE — Progress Notes (Signed)
   Covid-19 Vaccination Clinic  Name:  [name]    MRN: [id number] DOB: [birth year]  10/20/2019  [name] was observed post Covid-19 immunization for 15 minutes without incident. She was provided with Vaccine Information Sheet and instruction to access the V-Safe system.   [name] was instructed to call 911 with any severe reactions post vaccine: Marland Kitchen Difficulty breathing  . Swelling of face and throat  . A fast heartbeat  . A bad rash all over body  . Dizziness and weakness   Immunizations Administered    Name Date Dose VIS Date Route   Pfizer COVID-19 Vaccine 10/20/2019 10:50 AM 0.3 mL 03/24/2018 Intramuscular   Manufacturer: [employer], [employer]   Lot: [id number]   [place]: [address]-8

## 2019-10-27 ENCOUNTER — Telehealth: Payer: Self-pay | Admitting: Physician Assistant

## 2019-10-27 NOTE — Telephone Encounter (Signed)
She should have video visit to discuss to determine if this is culprit. She can go ahead and stop for now until her assessment.

## 2019-10-27 NOTE — Telephone Encounter (Signed)
Pt called in stating that she has been having diarrhea for 2-3 wks she thinks it is related to the Meloxicam. Please advise if she should cont. This medication. Pt can be reached at [phone]

## 2019-10-28 NOTE — Telephone Encounter (Signed)
Called patient in reference to PCP recommendations. Patient voiced understanding. My chart appointment scheduled for 10/29/19

## 2019-10-29 ENCOUNTER — Telehealth (INDEPENDENT_AMBULATORY_CARE_PROVIDER_SITE_OTHER): Payer: Medicare Other | Admitting: Physician Assistant

## 2019-10-29 ENCOUNTER — Encounter: Payer: Self-pay | Admitting: Physician Assistant

## 2019-10-29 DIAGNOSIS — R195 Other fecal abnormalities: Secondary | ICD-10-CM | POA: Diagnosis not present

## 2019-10-29 NOTE — Progress Notes (Signed)
Virtual Visit via Video   I connected with patient on 10/29/19 at  4:00 PM EDT by a video enabled telemedicine application and verified that I am speaking with the correct person using two identifiers.  Location patient: Home Location provider: [facility], Office Persons participating in the virtual visit: Patient, Provider, [place] (Patina Moore)  I discussed the limitations of evaluation and management by telemedicine and the availability of in person appointments. The patient expressed understanding and agreed to proceed.  Subjective:   HPI:   Patient presents via [facility] today c/o frequent loose stools over the past 2.5 weeks after being placed on Meloxicam by her SM provider. Notes loose caliber stool without melena, hematochezia or tenesmus. Denies nausea or vomiting. Denies heartburn symptoms or difficulty swallowing. Denies fever, chills, malaise or fatigue. Notes she stopped her medication a few days ago and has already noted a substantial improvement.   ROS:   See pertinent positives and negatives per HPI.  Patient Active Problem List   Diagnosis Date Noted   Diabetes mellitus, new onset ([place]) 04/12/2019   Tremor 11/05/2018   Chronic respiratory failure with hypoxia ([place]) 01/12/2018   GERD with esophagitis 11/26/2017   Dysphagia 08/20/2017   Chronic pain syndrome 08/04/2017   Comorbid sleep-related hypoventilation 08/04/2017   OSA (obstructive sleep apnea) 08/04/2017   Hepatic steatosis 08/04/2017   Polyp of ascending colon    Diverticulosis of colon without hemorrhage    Gastritis and gastroduodenitis    Hypothyroidism 07/22/2017   Anxiety and depression 07/22/2017   Iron deficiency anemia 07/22/2017   Pilar cyst 07/22/2017   Morbid obesity due to excess calories ([place]) 03/19/2017   Anemia 03/19/2017   COPD GOLD II still smoking  03/18/2017   Degenerative lumbar spinal stenosis 02/17/2017   S/P right hip revision 09/02/2016   S/P  hip replacement 09/02/2016   Instability of prosthetic hip ([place]) 03/01/2016   Dislocation of hip prosthesis ([place]) 02/17/2016   Abnormal nuclear stress test: Intermediate Risk 10/31/2015   Atypical angina ([place]) 10/31/2015   Trigger finger, acquired 08/20/2012   Cigarette smoker 12/12/2008   Hyperlipidemia 11/17/2008   HYPERTENSION, BENIGN 11/16/2008   GEN OSTEOARTHROSIS INVOLVING MULTIPLE SITES 11/16/2008    Social History   Tobacco Use   Smoking status: Current Every Day Smoker    Packs/day: 1.50    Years: 46.00    Pack years: 69.00    Types: Cigarettes   Smokeless tobacco: Never Used  Substance Use Topics   Alcohol use: No    Current Outpatient Medications:    albuterol (PROAIR HFA) 108 (90 Base) MCG/ACT inhaler, 2 puffs every 4 hours as needed only  if your can't catch your breath (Patient taking differently: Inhale 2 puffs into the lungs every 4 (four) hours as needed for wheezing or shortness of breath. if your can't catch your breath), Disp: 6.7 g, Rfl: 0   ALPRAZolam (XANAX) 1 MG tablet, TAKE 1 TABLET BY MOUTH EVERY MORNING, AT NOON, EVERY EVENING, AND AT BEDTIME, Disp: 120 tablet, Rfl: 0   atorvastatin (LIPITOR) 20 MG tablet, TAKE 1 TABLET BY MOUTH AT BEDTIME, Disp: 90 tablet, Rfl: 1   fluticasone (FLONASE) 50 MCG/ACT nasal spray, Place 2 sprays into both nostrils daily., Disp: 16 g, Rfl: 6   furosemide (LASIX) 20 MG tablet, Take 1 tablet (20 mg total) by mouth daily as needed., Disp: 90 tablet, Rfl: 0   gabapentin (NEURONTIN) 300 MG capsule, Take 600 mg by mouth 3 (three) times daily. , Disp: ,  Rfl:    glucose blood (ACCU-CHEK GUIDE) test strip, 1 each by Other route 3 (three) times daily as needed for other. as directed Dx E11.9, Disp: 100 strip, Rfl: 11   HYDROcodone-acetaminophen (NORCO) 10-325 MG tablet, TAKE 1 TABLET BY MOUTH EVERY 8 HOURS AS NEEDED, Disp: 90 tablet, Rfl: 0   JANUVIA 25 MG tablet, TAKE 1 TABLET BY MOUTH DAILY, Disp: 30 tablet, Rfl:  1   levothyroxine (SYNTHROID) 100 MCG tablet, TAKE 1 TABLET BY MOUTH EVERY DAY BEFORE BREAKFAST, Disp: 90 tablet, Rfl: 1   loratadine (CLARITIN) 10 MG tablet, Take 10 mg by mouth daily., Disp: , Rfl:    meloxicam (MOBIC) 7.5 MG tablet, Take 1 tablet (7.5 mg total) by mouth daily., Disp: 10 tablet, Rfl: 0   metFORMIN (GLUCOPHAGE-XR) 500 MG 24 hr tablet, TAKE 2 TABLETS BY MOUTH  EVERY DAY, Disp: 180 tablet, Rfl: 3   nystatin (MYCOSTATIN/NYSTOP) powder, APPLY TO THE AFFECTED AREA(S) TWICE DAILY, Disp: 15 g, Rfl: 0   omeprazole (PRILOSEC) 40 MG capsule, TAKE ONE CAPSULE BY MOUTH DAILY BEFORE BREAKFAST and TAKE ONE CAPSULE DAILY BEFORE DINNER, Disp: 60 capsule, Rfl: 2   ondansetron (ZOFRAN) 4 MG tablet, Take 1 tablet (4 mg total) by mouth every 8 (eight) hours as needed for nausea or vomiting., Disp: 10 tablet, Rfl: 0   OXYGEN, Inhale 2 L into the lungs at bedtime. , Disp: , Rfl:    potassium chloride (KLOR-CON) 10 MEQ tablet, Take 1 tablet (10 mEq total) by mouth at bedtime., Disp: 90 tablet, Rfl: 0   SYMBICORT 160-4.5 MCG/ACT inhaler, USE 2 INHALATIONS BY MOUTH  TWICE DAILY, Disp: 30.6 g, Rfl: 3   Tiotropium Bromide Monohydrate (SPIRIVA RESPIMAT) 2.5 MCG/ACT AERS, Inhale 2 puffs into the lungs daily., Disp: 12 g, Rfl: 0   triamcinolone cream (KENALOG) 0.1 %, Apply 1 application topically daily., Disp: 80 g, Rfl: 0   triamterene-hydrochlorothiazide (MAXZIDE-25) 37.5-25 MG tablet, Take 1 tablet by mouth daily., Disp: 90 tablet, Rfl: 2   venlafaxine XR (EFFEXOR-XR) 150 MG 24 hr capsule, Take 1 capsule (150 mg total) by mouth daily with breakfast., Disp: 30 capsule, Rfl: 1  Allergies  Allergen Reactions   Nsaids Diarrhea   Aleve [Naproxen Sodium] Other (See Comments)    Headache    Codeine Nausea Only and Other (See Comments)    GI upset   Penicillins Nausea Only and Other (See Comments)    GI upset    Sulfonamide Derivatives Hives    Objective:   There were no vitals  taken for this visit.  Patient is well-developed, well-nourished in no acute distress.  Resting comfortably at home.  Head is normocephalic, atraumatic.  No labored breathing.  Speech is clear and coherent with logical content.  Patient is alert and oriented at baseline.   Assessment and Plan:   1. Loose stools Patient has stopped Meloxicam. Seems was prescribed by Orthopedics and not SM after checking with pharmacy due to patient mentioning 90-day supply. She is to remain off of this. Follow-up bland diet. Will have her come in for labs to assess CMP and CBC. Will give her stool kit to take home. If all of her symptoms are resolved at that time will forego the stool kit itself.  [name], PA-C 10/29/2019

## 2019-10-29 NOTE — Patient Instructions (Signed)
Instructions sent to patients MyChart.

## 2019-10-31 ENCOUNTER — Other Ambulatory Visit: Payer: Self-pay | Admitting: Physician Assistant

## 2019-10-31 DIAGNOSIS — R609 Edema, unspecified: Secondary | ICD-10-CM

## 2019-11-02 ENCOUNTER — Ambulatory Visit: Payer: Medicare Other

## 2019-11-04 ENCOUNTER — Ambulatory Visit (INDEPENDENT_AMBULATORY_CARE_PROVIDER_SITE_OTHER): Payer: Medicare Other

## 2019-11-04 DIAGNOSIS — E039 Hypothyroidism, unspecified: Secondary | ICD-10-CM

## 2019-11-04 LAB — TSH: TSH: 4.39 u[IU]/mL (ref 0.35–4.50)
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
## 2019-11-09 ENCOUNTER — Other Ambulatory Visit: Payer: Self-pay | Admitting: Physician Assistant

## 2019-11-09 DIAGNOSIS — R609 Edema, unspecified: Secondary | ICD-10-CM

## 2019-11-10 ENCOUNTER — Other Ambulatory Visit: Payer: Self-pay

## 2019-11-10 ENCOUNTER — Ambulatory Visit: Payer: Medicare Other | Attending: Internal Medicine

## 2019-11-10 DIAGNOSIS — Z23 Encounter for immunization: Secondary | ICD-10-CM

## 2019-11-10 NOTE — Progress Notes (Signed)
   Covid-19 Vaccination Clinic  Name:  [name]    MRN: [id number] DOB: 1955/11/11  11/10/2019  [name] was observed post Covid-19 immunization for 15 minutes without incident. She was provided with Vaccine Information Sheet and instruction to access the V-Safe system.   [name] was instructed to call 911 with any severe reactions post vaccine: Marland Kitchen Difficulty breathing  . Swelling of face and throat  . A fast heartbeat  . A bad rash all over body  . Dizziness and weakness   Immunizations Administered    Name Date Dose VIS Date Route   Pfizer COVID-19 Vaccine 11/10/2019 10:38 AM 0.3 mL 03/24/2018 Intramuscular   Manufacturer: [employer], [employer]   Lot: [id number]   NDC: [address]-3

## 2019-11-11 ENCOUNTER — Encounter (HOSPITAL_COMMUNITY): Payer: Self-pay | Admitting: Psychiatry

## 2019-11-11 ENCOUNTER — Telehealth (INDEPENDENT_AMBULATORY_CARE_PROVIDER_SITE_OTHER): Payer: Medicare Other | Admitting: Psychiatry

## 2019-11-11 ENCOUNTER — Other Ambulatory Visit: Payer: Self-pay

## 2019-11-11 DIAGNOSIS — F330 Major depressive disorder, recurrent, mild: Secondary | ICD-10-CM

## 2019-11-11 MED ORDER — VENLAFAXINE HCL ER 75 MG PO CP24: ORAL_CAPSULE | ORAL | 1 refills | Status: DC

## 2019-11-11 MED ORDER — VENLAFAXINE HCL ER 150 MG PO CP24
ORAL_CAPSULE | ORAL | 1 refills | Status: DC
Start: 2019-11-11 — End: 2020-03-21

## 2019-11-11 NOTE — Patient Instructions (Signed)
1. Increase venlafaxine 225 mg daily 2. Referred to therapy   3. Next appointment- 11/18 1:40

## 2019-11-12 ENCOUNTER — Ambulatory Visit: Payer: Medicare Other | Admitting: Neurology

## 2019-11-15 ENCOUNTER — Telehealth: Payer: Self-pay | Admitting: Physician Assistant

## 2019-11-15 NOTE — Telephone Encounter (Signed)
Patient states she is having diarrhea for 3 months and wants to know if you can call her in something

## 2019-11-15 NOTE — Telephone Encounter (Signed)
She never came in for blood work on stool studies. Recommend she does this ASAP so we can further assess. At this point also plan to set her up with GI pending these results. This way we know how to treat. She can take Immodium OTC.

## 2019-11-15 NOTE — Telephone Encounter (Signed)
Called and spoke with patient. She is coming in tomorrow to get labs and pick up supplies for stool studies. She said she had come to the office one day but was told she didn't have anything that needed to be done. She will continue the immodium and stay well hydrated.

## 2019-11-16 ENCOUNTER — Ambulatory Visit: Payer: Medicare Other

## 2019-11-17 ENCOUNTER — Telehealth: Payer: Self-pay

## 2019-11-17 ENCOUNTER — Ambulatory Visit: Payer: Medicare Other

## 2019-11-17 ENCOUNTER — Other Ambulatory Visit: Payer: Self-pay | Admitting: Physician Assistant

## 2019-11-17 NOTE — Telephone Encounter (Signed)
LFD 10/12/19 #120 with no refills LOV 10/29/19  NOV none

## 2019-11-17 NOTE — Telephone Encounter (Addendum)
Patient called stating she has a nurse visit at 2pm.    Patient states she has cut her metformin in half and feels better now.   Would like to discuss this with Patina to see if she still needs labs and stool test.  Patient did cancel lab appt until she hears back.

## 2019-11-18 NOTE — Telephone Encounter (Signed)
Recommend repeat OV to discuss things further. Can get stool studies at that time if still appropriate.

## 2019-11-19 ENCOUNTER — Encounter (HOSPITAL_COMMUNITY): Payer: Self-pay | Admitting: *Deleted

## 2019-11-19 ENCOUNTER — Emergency Department (HOSPITAL_COMMUNITY)
Admission: EM | Admit: 2019-11-19 | Discharge: 2019-11-19 | Disposition: A | Discharge status: Home / Self Care | Payer: Medicare Other | Attending: Emergency Medicine | Admitting: Emergency Medicine

## 2019-11-19 ENCOUNTER — Other Ambulatory Visit: Payer: Self-pay

## 2019-11-19 DIAGNOSIS — R109 Unspecified abdominal pain: Secondary | ICD-10-CM | POA: Diagnosis present

## 2019-11-19 DIAGNOSIS — Z5321 Procedure and treatment not carried out due to patient leaving prior to being seen by health care provider: Secondary | ICD-10-CM | POA: Insufficient documentation

## 2019-11-19 DIAGNOSIS — R197 Diarrhea, unspecified: Secondary | ICD-10-CM | POA: Diagnosis not present

## 2019-11-19 DIAGNOSIS — Z20822 Contact with and (suspected) exposure to covid-19: Secondary | ICD-10-CM | POA: Insufficient documentation

## 2019-11-19 LAB — RESPIRATORY PANEL BY RT PCR (FLU A&B, COVID)
Influenza A by PCR: NEGATIVE
Influenza B by PCR: NEGATIVE
SARS Coronavirus 2 by RT PCR: NEGATIVE

## 2019-11-19 LAB — CBG MONITORING, ED: Glucose-Capillary: 105 mg/dL — ABNORMAL HIGH (ref 70–99)

## 2019-11-19 NOTE — Telephone Encounter (Signed)
Left pt vm to call back to schedule.

## 2019-11-19 NOTE — ED Triage Notes (Signed)
Diarrhea for 2 months, abdominal pain

## 2019-11-19 NOTE — ED Notes (Addendum)
Pt called and another pt with same last name to room

## 2019-11-22 ENCOUNTER — Telehealth: Payer: Self-pay | Admitting: Physician Assistant

## 2019-11-22 NOTE — Telephone Encounter (Signed)
FYI

## 2019-11-22 NOTE — Telephone Encounter (Signed)
Pt called in wanting to make [facility] aware that she is taking 500mg  of Metformin once a day and her levels are staying around 80 to 110.  Please advise

## 2019-11-24 ENCOUNTER — Ambulatory Visit: Payer: Medicare Other | Admitting: Gastroenterology

## 2019-11-24 ENCOUNTER — Encounter: Payer: Self-pay | Admitting: Gastroenterology

## 2019-11-24 VITALS — BP 112/70 | HR 100 | Ht 62.5 in | Wt 221.2 lb

## 2019-11-24 DIAGNOSIS — R110 Nausea: Secondary | ICD-10-CM

## 2019-11-24 MED ORDER — LOPERAMIDE HCL 2 MG PO TABS: 2.0000 mg | ORAL_TABLET | Freq: Two times a day (BID) | ORAL | 0 refills | Status: DC

## 2019-11-24 MED ORDER — ONDANSETRON HCL 4 MG PO TABS: 4.0000 mg | ORAL_TABLET | ORAL | 6 refills | Status: DC | PRN

## 2019-11-24 MED ORDER — LOPERAMIDE HCL 2 MG PO TABS: 2.0000 mg | ORAL_TABLET | Freq: Two times a day (BID) | ORAL | 3 refills | Status: DC

## 2019-11-24 NOTE — Progress Notes (Signed)
Review of pertinent gastrointestinal problems: 1.  Adenomatous colon polyp.  Colonoscopy June 2019 for iron deficiency anemia found a single subcentimeter adenoma. 2.  EGD June 2019 for iron deficiency anemia showed evidence of previous peptic ulcer disease in the prepyloric region, mild gastritis was negative for H. pylori.  I recommended repeat CBC in 2 months and that she strictly avoid NSAIDs especially Goody powders.  We have not heard from her since   HPI: This is a very pleasant 64 year old woman whom I last saw about 2 years ago  I originally met her about 2 years ago for an evaluation for iron deficiency anemia.  She had had a colonoscopy and upper endoscopy by another physician gastroenterology in [place] about 5 to 6 years prior to that.  Her MCV was 76 her hemoglobin was 9.6, RDW 22 platelets normal, iron 31 these are labs based on April 2019.  She explained her stools were intermittently dark and black, she took [place] powders every day usually 4/day.  She was on oxygen at night.   Blood work June 2021 hemoglobin 11.3, RDW 19.4, E. coli urinary tract infection +  Blood work July 2021 hemoglobin A1c 7.0  I last saw her in 2019 for iron deficiency anemia work-up.  She was eventually lost to follow-up after some initial testing.  She is here today for 2 reasons.  First she was diagnosed with diabetes several months ago and was started on diabetes medicines and they seem to clearly cause her to have diarrhea.  She tried Metformin and this caused diarrhea.  It was switched to Januvia and that caused worse diarrhea.  She is now back on Glucophage but at slightly lower doses than she should be.  She is taking 500 mg once daily.  She does not see blood in her stool.  She has vague abdominal pains.  She has lost 45 pounds and since getting her diabetes under control over the past several months.  She is also here to see me to get antinausea medicines.  Her grandson moved in with her and he  smokes a lot of marijuana and it makes her nauseous.  Review of systems: Pertinent positive and negative review of systems were noted in the above HPI section. All other review negative.   Past Medical History:  Diagnosis Date  . Allergic rhinitis   . Anemia   . Anxiety   . Chicken pox   . Chronic back pain   . COPD (chronic obstructive pulmonary disease) ([place])   . Depression   . DM (diabetes mellitus) ([place])   . Essential hypertension   . GERD (gastroesophageal reflux disease)   . Headache    migraines  . History of gastritis    EGD 2015  . History of home oxygen therapy    2 liters at hs last 6 months  . Hyperlipidemia   . Hypothyroidism   . Migraines   . Osteoarthritis    oa  . Scoliosis     Past Surgical History:  Procedure Laterality Date  . APPENDECTOMY     1985  . BIOPSY  07/24/2017   Procedure: BIOPSY;  Surgeon: Milus Banister, MD;  Location: [facility];  Service: Endoscopy;;  . CARDIAC CATHETERIZATION N/A 10/31/2015   Procedure: Left Heart Cath and Coronary Angiography;  Surgeon: Leonie Man, MD;  Location: [facility];  Service: Cardiovascular;  Laterality: N/A;  . CARPAL TUNNEL RELEASE Left   . CARPAL TUNNEL RELEASE Right   .  CHOLECYSTECTOMY  late 1980's  . COLONOSCOPY WITH PROPOFOL N/A 07/24/2017   Procedure: COLONOSCOPY WITH PROPOFOL;  Surgeon: Milus Banister, MD;  Location: [facility];  Service: Endoscopy;  Laterality: N/A;  . ESOPHAGOGASTRODUODENOSCOPY N/A 07/24/2017   Procedure: ESOPHAGOGASTRODUODENOSCOPY (EGD);  Surgeon: Milus Banister, MD;  Location: [facility];  Service: Endoscopy;  Laterality: N/A;  . GALLBLADDER SURGERY  1991  . HIP CLOSED REDUCTION Right 01/08/2016   Procedure: CLOSED MANIPULATION HIP;  Surgeon: Susa Day, MD;  Location: [facility];  Service: Orthopedics;  Laterality: Right;  . HIP CLOSED REDUCTION Right 01/19/2016   Procedure: ATTEMPTED CLOSED REDUCTION RIGHT HIP;  Surgeon: Wylene Simmer, MD;  Location: [facility];   Service: Orthopedics;  Laterality: Right;  . HIP CLOSED REDUCTION Right 01/20/2016   Procedure: CLOSED REDUCTION RIGHT TOTAL HIP;  Surgeon: Paralee Cancel, MD;  Location: [facility];  Service: Orthopedics;  Laterality: Right;  . HIP CLOSED REDUCTION Right 02/17/2016   Procedure: CLOSED REDUCTION RIGHT TOTAL HIP;  Surgeon: Rod Can, MD;  Location: [facility];  Service: Orthopedics;  Laterality: Right;  . HIP CLOSED REDUCTION Right 02/28/2016   Procedure: CLOSED REDUCTION HIP;  Surgeon: Nicholes Stairs, MD;  Location: [facility];  Service: Orthopedics;  Laterality: Right;  . POLYPECTOMY  07/24/2017   Procedure: POLYPECTOMY;  Surgeon: Milus Banister, MD;  Location: [facility];  Service: Endoscopy;;  . TONSILLECTOMY    . TOTAL ABDOMINAL HYSTERECTOMY     1985, with 1 ovary removed and 2 nd ovary removed 2003  . TOTAL HIP ARTHROPLASTY Right    Original surgery 2006 with revision 2010  . TOTAL HIP REVISION Right 01/01/2016   Procedure: TOTAL HIP REVISION;  Surgeon: Paralee Cancel, MD;  Location: [facility];  Service: Orthopedics;  Laterality: Right;  . TOTAL HIP REVISION Right 03/02/2016   Procedure: TOTAL HIP REVISION;  Surgeon: Paralee Cancel, MD;  Location: [facility];  Service: Orthopedics;  Laterality: Right;  . TOTAL HIP REVISION Right 09/02/2016   Procedure: Right hip constrained liner- posterior;  Surgeon: Paralee Cancel, MD;  Location: [facility];  Service: Orthopedics;  Laterality: Right;  . ULNAR NERVE TRANSPOSITION Right     Current Outpatient Medications  Medication Sig Dispense Refill  . albuterol (PROAIR HFA) 108 (90 Base) MCG/ACT inhaler 2 puffs every 4 hours as needed only  if your can't catch your breath (Patient taking differently: Inhale 2 puffs into the lungs every 4 (four) hours as needed for wheezing or shortness of breath. if your can't catch your breath) 6.7 g 0  . ALPRAZolam (XANAX) 1 MG tablet TAKE 1 TABLET BY MOUTH EVERY MORNING, ONE TABLET AT NOON, ONE TABLET EVERY EVENING, AND ONE TABLET AT  BEDTIME 120 tablet 0  . atorvastatin (LIPITOR) 20 MG tablet TAKE 1 TABLET BY MOUTH AT BEDTIME 90 tablet 1  . fluticasone (FLONASE) 50 MCG/ACT nasal spray Place 2 sprays into both nostrils daily. 16 g 6  . furosemide (LASIX) 20 MG tablet TAKE 1 TABLET BY MOUTH EVERY DAY AS NEEDED 90 tablet 0  . glucose blood (ACCU-CHEK GUIDE) test strip 1 each by Other route 3 (three) times daily as needed for other. as directed Dx E11.9 100 strip 11  . HYDROcodone-acetaminophen (NORCO) 10-325 MG tablet TAKE 1 TABLET BY MOUTH EVERY 8 HOURS AS NEEDED 90 tablet 0  . levothyroxine (SYNTHROID) 100 MCG tablet TAKE 1 TABLET BY MOUTH EVERY DAY BEFORE BREAKFAST 90 tablet 1  . loratadine (CLARITIN) 10 MG tablet Take 10 mg by mouth daily.    Marland Kitchen  metFORMIN (GLUCOPHAGE-XR) 500 MG 24 hr tablet TAKE 2 TABLETS BY MOUTH  EVERY DAY 180 tablet 3  . nystatin (MYCOSTATIN/NYSTOP) powder APPLY TO THE AFFECTED AREA(S) TWICE DAILY (Patient taking differently: Apply 1 application topically as needed. ) 15 g 0  . omeprazole (PRILOSEC) 40 MG capsule TAKE ONE CAPSULE BY MOUTH DAILY BEFORE BREAKFAST and TAKE ONE CAPSULE DAILY BEFORE DINNER 60 capsule 2  . OXYGEN Inhale 2 L into the lungs at bedtime.     . potassium chloride (KLOR-CON) 10 MEQ tablet TAKE 1 TABLET BY MOUTH AT BEDTIME 90 tablet 0  . SYMBICORT 160-4.5 MCG/ACT inhaler USE 2 INHALATIONS BY MOUTH  TWICE DAILY 30.6 g 3  . Tiotropium Bromide Monohydrate (SPIRIVA RESPIMAT) 2.5 MCG/ACT AERS Inhale 2 puffs into the lungs daily. 12 g 0  . triamterene-hydrochlorothiazide (MAXZIDE-25) 37.5-25 MG tablet Take 1 tablet by mouth daily. 90 tablet 2  . venlafaxine XR (EFFEXOR XR) 75 MG 24 hr capsule 225 mg daily. Take along with 150 mg tab 30 capsule 1  . venlafaxine XR (EFFEXOR-XR) 150 MG 24 hr capsule 225 mg daily. Take along with 75 mg tab 30 capsule 1   No current facility-administered medications for this visit.    Allergies as of 11/24/2019 - Review Complete 11/24/2019  Allergen Reaction  Noted  . Nsaids Diarrhea 06/10/2017  . Aleve [naproxen sodium] Other (See Comments) 10/26/2015  . Codeine Nausea Only and Other (See Comments)   . Penicillins Nausea Only and Other (See Comments)   . Sulfonamide derivatives Hives     Family History  Problem Relation Age of Onset  . COPD Mother   . Heart disease Mother   . Lung disease Father        Asbestosis  . Heart attack Father   . Heart disease Father   . Cerebral aneurysm Brother   . Aneurysm Brother        Brain  . Epilepsy Son   . Alcohol abuse Son   . Arthritis Maternal Grandmother   . Heart disease Maternal Grandmother   . Asthma Maternal Grandfather   . Cancer Maternal Grandfather   . Arthritis Paternal Grandmother   . Heart disease Paternal Grandmother   . Stroke Paternal Grandmother   . Early death Paternal Grandfather   . Heart disease Paternal Grandfather     Social History   Socioeconomic History  . Marital status: Married    Spouse name: Not on file  . Number of children: Not on file  . Years of education: Not on file  . Highest education level: Not on file  Occupational History  . Not on file  Tobacco Use  . Smoking status: Current Every Day Smoker    Packs/day: 1.50    Years: 46.00    Pack years: 69.00    Types: Cigarettes  . Smokeless tobacco: Never Used  Vaping Use  . Vaping Use: Never used  Substance and Sexual Activity  . Alcohol use: No  . Drug use: No  . Sexual activity: Yes  Other Topics Concern  . Not on file  Social History Narrative   Right handed    Caffeine~ 2 cups per day    Lives at home with husband (strained relationship)   Primary caretaker for disabled brother who had aneurism   Daughter died 06/25/18    Social Determinants of Health   Financial Resource Strain:   . Difficulty of Paying Living Expenses: Not on file  Food Insecurity: No Food Insecurity  .  Worried About Charity fundraiser in the Last Year: Never true  . Ran Out of Food in the Last Year: Never  true  Transportation Needs:   . Lack of Transportation (Medical): Not on file  . Lack of Transportation (Non-Medical): Not on file  Physical Activity:   . Days of Exercise per Week: Not on file  . Minutes of Exercise per Session: Not on file  Stress:   . Feeling of Stress : Not on file  Social Connections:   . Frequency of Communication with Friends and Family: Not on file  . Frequency of Social Gatherings with Friends and Family: Not on file  . Attends Religious Services: Not on file  . Active Member of Clubs or Organizations: Not on file  . Attends Archivist Meetings: Not on file  . Marital Status: Not on file  Intimate Partner Violence:   . Fear of Current or Ex-Partner: Not on file  . Emotionally Abused: Not on file  . Physically Abused: Not on file  . Sexually Abused: Not on file     Physical Exam: BP 112/70 (BP Location: Left Arm, Patient Position: Sitting, Cuff Size: Normal)   Pulse 100   Ht 5' 2.5" (1.588 m) Comment: height measured without shoes  Wt 221 lb 4 oz (100.4 kg)   BMI 39.82 kg/m  Constitutional: generally well-appearing Psychiatric: alert and oriented x3 Eyes: extraocular movements intact Mouth: oral pharynx moist, no lesions Neck: supple no lymphadenopathy Cardiovascular: heart regular rate and rhythm Lungs: clear to auscultation bilaterally Abdomen: soft, nontender, nondistended, no obvious ascites, no peritoneal signs, normal bowel sounds Extremities: no lower extremity edema bilaterally Skin: no lesions on visible extremities   Assessment and plan: 64 y.o. female with side effect of Metformin of diarrhea, nausea  First I recommended that she discuss with her primary care physician alternatives to Metformin for her diabetes.  If Metformin is really thought to be the best medicine for her then she should start taking a single Imodium twice daily on a scheduled basis for the diarrhea and call me if it is not helpful.  Second I wrote her a  prescription for Zofran 4 mg pills 1 pill as needed for nausea related to her grandson smoking marijuana.  She is to follow-up on an as-needed basis.  Please see the "Patient Instructions" section for addition details about the plan.   Owens Loffler, MD [facility] 11/24/2019, 3:28 PM  Cc: Brunetta Jeans, PA-C  Total time on date of encounter was   minutes (this included time spent preparing to see the patient reviewing records; obtaining and/or reviewing separately obtained history; performing a medically appropriate exam and/or evaluation; counseling and educating the patient and family if present; ordering medications, tests or procedures if applicable; and documenting clinical information in the health record).

## 2019-11-24 NOTE — Patient Instructions (Addendum)
If you are age 63 or older, your body mass index should be between 23-30. Your Body mass index is 39.82 kg/m. If this is out of the aforementioned range listed, please consider follow up with your Primary Care Provider.  If you are age 56 or younger, your body mass index should be between 19-25. Your Body mass index is 39.82 kg/m. If this is out of the aformentioned range listed, please consider follow up with your Primary Care Provider.   We have sent the following medications to your pharmacy for you to pick up at your convenience:  START: Zofran 4mg  take one tablet as needed for nausea.  Please purchase the following medications over the counter and take as directed:  START: Imodium take one tablet twice daily for diarrhea (medicine related side effects).  Thank you for entrusting me with your care and choosing [facility].  Dr Ardis Hughs

## 2019-11-24 NOTE — Telephone Encounter (Signed)
Patient already has an appointment with you scheduled for 11/30/19 (Tuesday) for a follow up visit.

## 2019-11-24 NOTE — Telephone Encounter (Signed)
Recommend in-office follow-up end of week or early next week to reassess diarrhea and glucose control

## 2019-11-26 ENCOUNTER — Other Ambulatory Visit: Payer: Self-pay | Admitting: Emergency Medicine

## 2019-11-26 DIAGNOSIS — G894 Chronic pain syndrome: Secondary | ICD-10-CM

## 2019-11-26 MED ORDER — HYDROCODONE-ACETAMINOPHEN 10-325 MG PO TABS: 1.0000 | ORAL_TABLET | Freq: Three times a day (TID) | ORAL | 0 refills | Status: DC | PRN

## 2019-11-26 NOTE — Telephone Encounter (Signed)
Indication for chronic opioid: Chronic pain syndrome Medication and dose: Hydrocodone 10/325 mg # pills per month: 90 on 10/19/19 Last UDS date: 08/12/19 Opioid Treatment Agreement signed (Y/N): Yes, Xanax 08/21/17 Opioid Treatment Agreement last reviewed with patient:   [facility] reviewed this encounter (include red flags):     LOV: 10/29/19 Loose stool

## 2019-11-28 ENCOUNTER — Other Ambulatory Visit: Payer: Self-pay | Admitting: Physician Assistant

## 2019-11-30 ENCOUNTER — Other Ambulatory Visit: Payer: Self-pay

## 2019-11-30 ENCOUNTER — Ambulatory Visit (INDEPENDENT_AMBULATORY_CARE_PROVIDER_SITE_OTHER): Payer: Medicare Other | Admitting: Physician Assistant

## 2019-11-30 ENCOUNTER — Encounter: Payer: Self-pay | Admitting: Physician Assistant

## 2019-11-30 VITALS — BP 120/70 | HR 96 | Temp 98.2°F | Resp 14 | Ht 62.5 in | Wt 219.0 lb

## 2019-11-30 DIAGNOSIS — E119 Type 2 diabetes mellitus without complications: Secondary | ICD-10-CM | POA: Diagnosis not present

## 2019-11-30 DIAGNOSIS — Z23 Encounter for immunization: Secondary | ICD-10-CM | POA: Diagnosis not present

## 2019-11-30 NOTE — Progress Notes (Signed)
Patient presents to clinic today to follow-up regarding diarrhea. Patient's Metformin XR was decreased to 1/2 dose with some noted improvement in the symptoms. Still present but much better. Notes since taking 1/2 dose her glucose levels fasting are averaging 90-100 fasting. Has been using Immodium per GI instructions to help with symptoms. Notes this does help considerably.  Past Medical History:  Diagnosis Date  . Allergic rhinitis   . Anemia   . Anxiety   . Chicken pox   . Chronic back pain   . COPD (chronic obstructive pulmonary disease) ([place])   . Depression   . DM (diabetes mellitus) ([place])   . Essential hypertension   . GERD (gastroesophageal reflux disease)   . Headache    migraines  . History of gastritis    EGD 2015  . History of home oxygen therapy    2 liters at hs last 6 months  . Hyperlipidemia   . Hypothyroidism   . Migraines   . Osteoarthritis    oa  . Scoliosis     Current Outpatient Medications on File Prior to Visit  Medication Sig Dispense Refill  . albuterol (PROAIR HFA) 108 (90 Base) MCG/ACT inhaler 2 puffs every 4 hours as needed only  if your can't catch your breath (Patient taking differently: Inhale 2 puffs into the lungs every 4 (four) hours as needed for wheezing or shortness of breath. if your can't catch your breath) 6.7 g 0  . ALPRAZolam (XANAX) 1 MG tablet TAKE 1 TABLET BY MOUTH EVERY MORNING, ONE TABLET AT NOON, ONE TABLET EVERY EVENING, AND ONE TABLET AT BEDTIME 120 tablet 0  . atorvastatin (LIPITOR) 20 MG tablet TAKE 1 TABLET BY MOUTH AT BEDTIME 90 tablet 1  . fluticasone (FLONASE) 50 MCG/ACT nasal spray Place 2 sprays into both nostrils daily. 16 g 6  . furosemide (LASIX) 20 MG tablet TAKE 1 TABLET BY MOUTH EVERY DAY AS NEEDED 90 tablet 0  . glucose blood (ACCU-CHEK GUIDE) test strip 1 each by Other route 3 (three) times daily as needed for other. as directed Dx E11.9 100 strip 11  . HYDROcodone-acetaminophen (NORCO) 10-325 MG tablet Take 1  tablet by mouth every 8 (eight) hours as needed. 90 tablet 0  . levothyroxine (SYNTHROID) 100 MCG tablet TAKE 1 TABLET BY MOUTH EVERY DAY BEFORE BREAKFAST 90 tablet 1  . loperamide (IMODIUM A-D) 2 MG tablet Take 1 tablet (2 mg total) by mouth in the morning and at bedtime. 60 tablet 3  . loratadine (CLARITIN) 10 MG tablet Take 10 mg by mouth daily.    Marland Kitchen omeprazole (PRILOSEC) 40 MG capsule TAKE ONE CAPSULE BY MOUTH DAILY BEFORE BREAKFAST and TAKE ONE CAPSULE DAILY BEFORE DINNER 60 capsule 2  . ondansetron (ZOFRAN) 4 MG tablet Take 1 tablet (4 mg total) by mouth as needed for nausea. 50 tablet 6  . OXYGEN Inhale 2 L into the lungs at bedtime.     . potassium chloride (KLOR-CON) 10 MEQ tablet TAKE 1 TABLET BY MOUTH AT BEDTIME 90 tablet 0  . SYMBICORT 160-4.5 MCG/ACT inhaler USE 2 INHALATIONS BY MOUTH  TWICE DAILY 30.6 g 3  . Tiotropium Bromide Monohydrate (SPIRIVA RESPIMAT) 2.5 MCG/ACT AERS Inhale 2 puffs into the lungs daily. 12 g 0  . triamterene-hydrochlorothiazide (MAXZIDE-25) 37.5-25 MG tablet TAKE 1 TABLET BY MOUTH EVERY DAY 90 tablet 1  . venlafaxine XR (EFFEXOR XR) 75 MG 24 hr capsule 225 mg daily. Take along with 150 mg tab 30 capsule 1  .  venlafaxine XR (EFFEXOR-XR) 150 MG 24 hr capsule 225 mg daily. Take along with 75 mg tab 30 capsule 1   No current facility-administered medications on file prior to visit.    Allergies  Allergen Reactions  . Nsaids Diarrhea  . Aleve [Naproxen Sodium] Other (See Comments)    Headache   . Codeine Nausea Only and Other (See Comments)    GI upset  . Penicillins Nausea Only and Other (See Comments)    GI upset   . Sulfonamide Derivatives Hives    Family History  Problem Relation Age of Onset  . COPD Mother   . Heart disease Mother   . Lung disease Father        Asbestosis  . Heart attack Father   . Heart disease Father   . Cerebral aneurysm Brother   . Aneurysm Brother        Brain  . Epilepsy Son   . Alcohol abuse Son   . Arthritis  Maternal Grandmother   . Heart disease Maternal Grandmother   . Asthma Maternal Grandfather   . Cancer Maternal Grandfather   . Arthritis Paternal Grandmother   . Heart disease Paternal Grandmother   . Stroke Paternal Grandmother   . Early death Paternal Grandfather   . Heart disease Paternal Grandfather     Social History   Socioeconomic History  . Marital status: Married    Spouse name: Not on file  . Number of children: Not on file  . Years of education: Not on file  . Highest education level: Not on file  Occupational History  . Not on file  Tobacco Use  . Smoking status: Current Every Day Smoker    Packs/day: 1.50    Years: 46.00    Pack years: 69.00    Types: Cigarettes  . Smokeless tobacco: Never Used  Vaping Use  . Vaping Use: Never used  Substance and Sexual Activity  . Alcohol use: No  . Drug use: No  . Sexual activity: Yes  Other Topics Concern  . 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 Difficulty of Paying Living Expenses: Not on file  Food Insecurity: No Food Insecurity  . Worried About Charity fundraiser in the Last Year: Never true  . Ran Out of Food in the Last Year: Never true  Transportation Needs:   . Lack of Transportation (Medical): Not on file  . Lack of Transportation (Non-Medical): Not on file  Physical Activity:   . Days of Exercise per Week: Not on file  . Minutes of Exercise per Session: Not on file  Stress:   . Feeling of Stress : Not on file  Social Connections:   . Frequency of Communication with Friends and Family: Not on file  . Frequency of Social Gatherings with Friends and Family: Not on file  . Attends Religious Services: Not on file  . Active Member of Clubs or Organizations:  Not on file  . Attends Archivist Meetings: Not on file  . Marital Status: Not on file   Review of Systems - See HPI.  All other ROS are negative.  BP 120/70   Pulse 96   Temp 98.2  F (36.8 C) (Temporal)   Resp 14   Ht 5' 2.5" (1.588 m)   Wt 219 lb (99.3 kg)   SpO2 98%   BMI 39.42 kg/m   Physical Exam Vitals reviewed.  Constitutional:      Appearance: Normal appearance.  HENT:     Head: Normocephalic and atraumatic.  Cardiovascular:     Rate and Rhythm: Normal rate and regular rhythm.     Pulses: Normal pulses.     Heart sounds: Normal heart sounds.  Pulmonary:     Effort: Pulmonary effort is normal.     Breath sounds: Normal breath sounds.  Musculoskeletal:     Cervical back: Neck supple.  Neurological:     General: No focal deficit present.     Mental Status: She is alert.     Recent Results (from the past 2160 hour(s))  TSH     Status: None   Collection Time: 11/04/19  1:42 PM  Result Value Ref Range   TSH 4.39 0.35 - 4.50 uIU/mL  Respiratory Panel by RT PCR (Flu A&B, Covid) - Nasopharyngeal Swab     Status: None   Collection Time: 11/19/19  5:20 PM   Specimen: Nasopharyngeal Swab  Result Value Ref Range   SARS Coronavirus 2 by RT PCR NEGATIVE NEGATIVE    Comment: (NOTE) SARS-CoV-2 target nucleic acids are NOT DETECTED.  The SARS-CoV-2 RNA is generally detectable in upper respiratoy specimens during the acute phase of infection. The lowest concentration of SARS-CoV-2 viral copies this assay can detect is 131 copies/mL. A negative result does not preclude SARS-Cov-2 infection and should not be used as the sole basis for treatment or other patient management decisions. A negative result may occur with  improper specimen collection/handling, submission of specimen other than nasopharyngeal swab, presence of viral mutation(s) within the areas targeted by this assay, and inadequate number of viral copies (<131 copies/mL). A negative result must be  combined with clinical observations, patient history, and epidemiological information. The expected result is Negative.  Fact Sheet for Patients:  PinkCheek.be  Fact Sheet for Healthcare Providers:  GravelBags.it  This test is no t yet approved or cleared by the [place] FDA and  has been authorized for detection and/or diagnosis of SARS-CoV-2 by FDA under an Emergency Use Authorization (EUA). This EUA will remain  in effect (meaning this test can be used) for the duration of the COVID-19 declaration under Section 564(b)(1) of the Act, 21 U.S.C. section 360bbb-3(b)(1), unless the authorization is terminated or revoked sooner.     Influenza A by PCR NEGATIVE NEGATIVE   Influenza B by PCR NEGATIVE NEGATIVE    Comment: (NOTE) The Xpert Xpress SARS-CoV-2/FLU/RSV assay is intended as an aid in  the diagnosis of influenza from Nasopharyngeal swab specimens and  should not be used as a sole basis for treatment. Nasal washings and  aspirates are unacceptable for Xpert Xpress SARS-CoV-2/FLU/RSV  testing.  Fact Sheet for Patients: PinkCheek.be  Fact Sheet for Healthcare Providers: GravelBags.it  This test is not yet approved or cleared by the [place] FDA and  has been authorized for detection and/or diagnosis of SARS-CoV-2 by  FDA under an Emergency Use Authorization (EUA). This EUA will remain  in effect (meaning this test can be used) for the duration of the  Covid-19 declaration under Section 564(b)(1) of the Act, 21  U.S.C. section 360bbb-3(b)(1), unless the authorization is  terminated or revoked. Performed at [facility], [address], [place], [place] [address]  CBG monitoring, ED     Status: Abnormal   Collection Time: 11/19/19  5:24 PM  Result Value Ref Range   Glucose-Capillary 105 (H) 70 - 99 mg/dL    Comment: Glucose reference range applies  only to samples taken after fasting for at least 8 hours.  Hemoglobin A1c     Status: Abnormal   Collection Time: 11/30/19  4:09 PM  Result Value Ref Range   Hgb A1c MFr Bld 6.6 (H) 4.6 - 6.5 %    Comment: Glycemic Control Guidelines for People with Diabetes:Non Diabetic:  <6%Goal of Therapy: <7%Additional Action Suggested:  >8%   Comp Met (CMET)     Status: Abnormal   Collection Time: 11/30/19  4:09 PM  Result Value Ref Range   Sodium 134 (L) 135 - 145 mEq/L   Potassium 3.9 3.5 - 5.1 mEq/L   Chloride 98 96 - 112 mEq/L   CO2 28 19 - 32 mEq/L   Glucose, Bld 76 70 - 99 mg/dL   BUN 12 6 - 23 mg/dL   Creatinine, Ser 0.96 0.40 - 1.20 mg/dL   Total Bilirubin 0.5 0.2 - 1.2 mg/dL   Alkaline Phosphatase 92 39 - 117 U/L   AST 23 0 - 37 U/L   ALT 21 0 - 35 U/L   Total Protein 6.9 6.0 - 8.3 g/dL   Albumin 3.9 3.5 - 5.2 g/dL   GFR 62.82 >60.00 mL/min    Comment: Calculated using the CKD-EPI Creatinine Equation (2021)   Calcium 9.0 8.4 - 10.5 mg/dL  Lipid panel     Status: Abnormal   Collection Time: 11/30/19  4:09 PM  Result Value Ref Range   Cholesterol 98 0 - 200 mg/dL    Comment: ATP III Classification       Desirable:  < 200 mg/dL               Borderline High:  200 - 239 mg/dL          High:  > = 240 mg/dL   Triglycerides 193.0 (H) 0 - 149 mg/dL    Comment: Normal:  <150 mg/dLBorderline High:  150 - 199 mg/dL   HDL 31.90 (L) >39.00 mg/dL   VLDL 38.6 0.0 - 40.0 mg/dL   LDL Cholesterol 27 0 - 99 mg/dL   Total CHOL/HDL Ratio 3     Comment:                Men          Women1/2 Average Risk     3.4          3.3Average Risk          5.0          4.42X Average Risk          9.6          7.13X Average Risk          15.0          11.0                       NonHDL 65.77     Comment: NOTE:  Non-HDL goal should be 30 mg/dL higher than patient's LDL goal (i.e. LDL goal of < 70 mg/dL, would have non-HDL goal of < 100 mg/dL)   Assessment/Plan: 1. Diabetes mellitus, new onset ([place]) Improvement in  stools with decrease in Metformin. Fasting glucose 90-100. Will hold Metformin completely for now. Monitor for  resolution of loose stools over the next 2 weeks while closely monitoring fasting glucose. Will repeat labs today.  - Hemoglobin A1c - Comp Met (CMET) - Lipid panel  2. Need for immunization against influenza - Flu Vaccine QUAD 36+ mos IM  This visit occurred during the SARS-CoV-2 public health emergency.  Safety protocols were in place, including screening questions prior to the visit, additional usage of staff PPE, and extensive cleaning of exam room while observing appropriate contact time as indicated for disinfecting solutions.     [name], PA-C

## 2019-11-30 NOTE — Patient Instructions (Signed)
Please go to the lab today for blood work.  I will call you with your results. We will alter treatment regimen(s) if indicated by your results.   Your flu shot is up-to-date as of today.   Please follow-up with Psychiatry as scheduled.  Can forego Neurology follow-up presently.   Stop the Metformin. Keep a low carb diet and continue to check glucose each morning.  If staying less than 125 we are good to go.  If climbing further, please let me know as we will need to consider starting a different medication.

## 2019-12-01 LAB — LIPID PANEL
Cholesterol: 98 mg/dL (ref 0–200)
HDL: 31.9 mg/dL — ABNORMAL LOW (ref >39.00)
LDL Cholesterol: 27 mg/dL (ref 0–99)
NonHDL: 65.77
Total CHOL/HDL Ratio: 3
Triglycerides: 193 mg/dL — ABNORMAL HIGH (ref 0.0–149.0)
VLDL: 38.6 mg/dL (ref 0.0–40.0)

## 2019-12-01 LAB — COMPREHENSIVE METABOLIC PANEL
ALT: 21 U/L (ref 0–35)
AST: 23 U/L (ref 0–37)
Albumin: 3.9 g/dL (ref 3.5–5.2)
Alkaline Phosphatase: 92 U/L (ref 39–117)
BUN: 12 mg/dL (ref 6–23)
CO2: 28 mEq/L (ref 19–32)
Calcium: 9 mg/dL (ref 8.4–10.5)
Chloride: 98 mEq/L (ref 96–112)
Creatinine, Ser: 0.96 mg/dL (ref 0.40–1.20)
GFR: 62.82 mL/min (ref >60.00)
Glucose, Bld: 76 mg/dL (ref 70–99)
Potassium: 3.9 mEq/L (ref 3.5–5.1)
Sodium: 134 mEq/L — ABNORMAL LOW (ref 135–145)
Total Bilirubin: 0.5 mg/dL (ref 0.2–1.2)
Total Protein: 6.9 g/dL (ref 6.0–8.3)

## 2019-12-01 LAB — HEMOGLOBIN A1C: Hgb A1c MFr Bld: 6.6 % — ABNORMAL HIGH (ref 4.6–6.5)

## 2019-12-02 ENCOUNTER — Ambulatory Visit: Payer: Medicare Other

## 2019-12-02 NOTE — Progress Notes (Unsigned)
Chronic Care Management Pharmacy  Name: [name]  MRN: [id number] DOB: Feb 21, 1955  Chief Complaint/ HPI  [name],  64 y.o. , female presents for their Follow-Up CCM visit with the clinical pharmacist via telephone due to COVID-19 Pandemic.   PCP : Brunetta Jeans, PA-C  No diagnosis found.   Patient Active Problem List   Diagnosis Date Noted  . Diabetes mellitus, new onset ([place]) 04/12/2019  . Tremor 11/05/2018  . Chronic respiratory failure with hypoxia ([place]) 01/12/2018  . GERD with esophagitis 11/26/2017  . Dysphagia 08/20/2017  . Chronic pain syndrome 08/04/2017  . Comorbid sleep-related hypoventilation 08/04/2017  . OSA (obstructive sleep apnea) 08/04/2017  . Hepatic steatosis 08/04/2017  . Polyp of ascending colon   . Diverticulosis of colon without hemorrhage   . Gastritis and gastroduodenitis   . Hypothyroidism 07/22/2017  . Anxiety and depression 07/22/2017  . Iron deficiency anemia 07/22/2017  . Pilar cyst 07/22/2017  . Morbid obesity due to excess calories ([place]) 03/19/2017  . Anemia 03/19/2017  . COPD GOLD II still smoking  03/18/2017  . Degenerative lumbar spinal stenosis 02/17/2017  . S/P right hip revision 09/02/2016  . S/P hip replacement 09/02/2016  . Instability of prosthetic hip ([place]) 03/01/2016  . Dislocation of hip prosthesis ([place]) 02/17/2016  . Abnormal nuclear stress test: Intermediate Risk 10/31/2015  . Atypical angina ([place]) 10/31/2015  . Trigger finger, acquired 08/20/2012  . Cigarette smoker 12/12/2008  . Hyperlipidemia 11/17/2008  . HYPERTENSION, BENIGN 11/16/2008  . GEN OSTEOARTHROSIS INVOLVING MULTIPLE SITES 11/16/2008   Past Surgical History:  Procedure Laterality Date  . APPENDECTOMY     1985  . BIOPSY  07/24/2017   Procedure: BIOPSY;  Surgeon: Milus Banister, MD;  Location: [facility];  Service: Endoscopy;;  . CARDIAC CATHETERIZATION N/A 10/31/2015   Procedure: Left Heart Cath and Coronary Angiography;  Surgeon:  Leonie Man, MD;  Location: [facility];  Service: Cardiovascular;  Laterality: N/A;  . CARPAL TUNNEL RELEASE Left   . CARPAL TUNNEL RELEASE Right   . CHOLECYSTECTOMY  late 1980's  . COLONOSCOPY WITH PROPOFOL N/A 07/24/2017   Procedure: COLONOSCOPY WITH PROPOFOL;  Surgeon: Milus Banister, MD;  Location: [facility];  Service: Endoscopy;  Laterality: N/A;  . ESOPHAGOGASTRODUODENOSCOPY N/A 07/24/2017   Procedure: ESOPHAGOGASTRODUODENOSCOPY (EGD);  Surgeon: Milus Banister, MD;  Location: [facility];  Service: Endoscopy;  Laterality: N/A;  . GALLBLADDER SURGERY  1991  . HIP CLOSED REDUCTION Right 01/08/2016   Procedure: CLOSED MANIPULATION HIP;  Surgeon: Susa Day, MD;  Location: [facility];  Service: Orthopedics;  Laterality: Right;  . HIP CLOSED REDUCTION Right 01/19/2016   Procedure: ATTEMPTED CLOSED REDUCTION RIGHT HIP;  Surgeon: Wylene Simmer, MD;  Location: [facility];  Service: Orthopedics;  Laterality: Right;  . HIP CLOSED REDUCTION Right 01/20/2016   Procedure: CLOSED REDUCTION RIGHT TOTAL HIP;  Surgeon: Paralee Cancel, MD;  Location: [facility];  Service: Orthopedics;  Laterality: Right;  . HIP CLOSED REDUCTION Right 02/17/2016   Procedure: CLOSED REDUCTION RIGHT TOTAL HIP;  Surgeon: Rod Can, MD;  Location: [place] [facility];  Service: Orthopedics;  Laterality: Right;  . HIP CLOSED REDUCTION Right 02/28/2016   Procedure: CLOSED REDUCTION HIP;  Surgeon: Nicholes Stairs, MD;  Location: [facility];  Service: Orthopedics;  Laterality: Right;  . POLYPECTOMY  07/24/2017   Procedure: POLYPECTOMY;  Surgeon: Milus Banister, MD;  Location: [facility];  Service: Endoscopy;;  . TONSILLECTOMY    . TOTAL ABDOMINAL HYSTERECTOMY  1985, with 1 ovary removed and 2 nd ovary removed 2003  . TOTAL HIP ARTHROPLASTY Right    Original surgery 2006 with revision 2010  . TOTAL HIP REVISION Right 01/01/2016   Procedure: TOTAL HIP REVISION;  Surgeon: Paralee Cancel, MD;  Location: [facility];  Service:  Orthopedics;  Laterality: Right;  . TOTAL HIP REVISION Right 03/02/2016   Procedure: TOTAL HIP REVISION;  Surgeon: Paralee Cancel, MD;  Location: [facility];  Service: Orthopedics;  Laterality: Right;  . TOTAL HIP REVISION Right 09/02/2016   Procedure: Right hip constrained liner- posterior;  Surgeon: Paralee Cancel, MD;  Location: [facility];  Service: Orthopedics;  Laterality: Right;  . ULNAR NERVE TRANSPOSITION Right    Social History   Socioeconomic History  . Marital status: Married    Spouse name: Not on file  . Number of children: Not on file  . Years of education: Not on file  . Highest education level: Not on file  Occupational History  . Not on file  Tobacco Use  . Smoking status: Current Every Day Smoker    Packs/day: 1.50    Years: 46.00    Pack years: 69.00    Types: Cigarettes  . Smokeless tobacco: Never Used  Vaping Use  . Vaping Use: Never used  Substance and Sexual Activity  . Alcohol use: No  . Drug use: No  . Sexual activity: Yes  Other Topics Concern  . Not on file  Social History Narrative   Right handed    Caffeine~ 2 cups per day    Lives at home with husband (strained relationship)   Primary caretaker for disabled brother who had aneurism   Daughter died 2018/07/13    Social Determinants of Health   Financial Resource Strain:   . Difficulty of Paying Living Expenses: Not on file  Food Insecurity: No Food Insecurity  . Worried About Charity fundraiser in the Last Year: Never true  . Ran Out of Food in the Last Year: Never true  Transportation Needs:   . Lack of Transportation (Medical): Not on file  . Lack of Transportation (Non-Medical): Not on file  Physical Activity:   . Days of Exercise per Week: Not on file  . Minutes of Exercise per Session: Not on file  Stress:   . Feeling of Stress : Not on file  Social Connections:   . Frequency of Communication with Friends and Family: Not on file  . Frequency of Social Gatherings with Friends and Family: Not  on file  . Attends Religious Services: Not on file  . Active Member of Clubs or Organizations: Not on file  . Attends Archivist Meetings: Not on file  . Marital Status: Not on file   Family History  Problem Relation Age of Onset  . COPD Mother   . Heart disease Mother   . Lung disease Father        Asbestosis  . Heart attack Father   . Heart disease Father   . Cerebral aneurysm Brother   . Aneurysm Brother        Brain  . Epilepsy Son   . Alcohol abuse Son   . Arthritis Maternal Grandmother   . Heart disease Maternal Grandmother   . Asthma Maternal Grandfather   . Cancer Maternal Grandfather   . Arthritis Paternal Grandmother   . Heart disease Paternal Grandmother   . Stroke Paternal Grandmother   . Early death Paternal Grandfather   . Heart disease  Paternal Grandfather    Allergies  Allergen Reactions  . Nsaids Diarrhea  . Aleve [Naproxen Sodium] Other (See Comments)    Headache   . Codeine Nausea Only and Other (See Comments)    GI upset  . Penicillins Nausea Only and Other (See Comments)    GI upset   . Sulfonamide Derivatives Hives   Outpatient Encounter Medications as of 12/02/2019  Medication Sig  . albuterol (PROAIR HFA) 108 (90 Base) MCG/ACT inhaler 2 puffs every 4 hours as needed only  if your can't catch your breath (Patient taking differently: Inhale 2 puffs into the lungs every 4 (four) hours as needed for wheezing or shortness of breath. if your can't catch your breath)  . ALPRAZolam (XANAX) 1 MG tablet TAKE 1 TABLET BY MOUTH EVERY MORNING, ONE TABLET AT NOON, ONE TABLET EVERY EVENING, AND ONE TABLET AT BEDTIME  . atorvastatin (LIPITOR) 20 MG tablet TAKE 1 TABLET BY MOUTH AT BEDTIME  . fluticasone (FLONASE) 50 MCG/ACT nasal spray Place 2 sprays into both nostrils daily.  . furosemide (LASIX) 20 MG tablet TAKE 1 TABLET BY MOUTH EVERY DAY AS NEEDED  . glucose blood (ACCU-CHEK GUIDE) test strip 1 each by Other route 3 (three) times daily as needed  for other. as directed Dx E11.9  . HYDROcodone-acetaminophen (NORCO) 10-325 MG tablet Take 1 tablet by mouth every 8 (eight) hours as needed.  Marland Kitchen levothyroxine (SYNTHROID) 100 MCG tablet TAKE 1 TABLET BY MOUTH EVERY DAY BEFORE BREAKFAST  . loperamide (IMODIUM A-D) 2 MG tablet Take 1 tablet (2 mg total) by mouth in the morning and at bedtime.  Marland Kitchen loratadine (CLARITIN) 10 MG tablet Take 10 mg by mouth daily.  Marland Kitchen omeprazole (PRILOSEC) 40 MG capsule TAKE ONE CAPSULE BY MOUTH DAILY BEFORE BREAKFAST and TAKE ONE CAPSULE DAILY BEFORE DINNER  . ondansetron (ZOFRAN) 4 MG tablet Take 1 tablet (4 mg total) by mouth as needed for nausea.  . OXYGEN Inhale 2 L into the lungs at bedtime.   . potassium chloride (KLOR-CON) 10 MEQ tablet TAKE 1 TABLET BY MOUTH AT BEDTIME  . SYMBICORT 160-4.5 MCG/ACT inhaler USE 2 INHALATIONS BY MOUTH  TWICE DAILY  . Tiotropium Bromide Monohydrate (SPIRIVA RESPIMAT) 2.5 MCG/ACT AERS Inhale 2 puffs into the lungs daily.  Marland Kitchen triamterene-hydrochlorothiazide (MAXZIDE-25) 37.5-25 MG tablet TAKE 1 TABLET BY MOUTH EVERY DAY  . venlafaxine XR (EFFEXOR XR) 75 MG 24 hr capsule 225 mg daily. Take along with 150 mg tab  . venlafaxine XR (EFFEXOR-XR) 150 MG 24 hr capsule 225 mg daily. Take along with 75 mg tab   No facility-administered encounter medications on file as of 12/02/2019.   Patient Care Team    Relationship Specialty Notifications Start End  Brunetta Jeans, [place] PCP - General Family Medicine  07/18/17   Milus Banister, MD Attending Physician Gastroenterology  07/18/17   Tanda Rockers, MD Consulting Physician Pulmonary Disease  07/18/17   Dr. Retta Mac    07/18/17    Comment: Dentist  Paralee Cancel, MD Consulting Physician Orthopedic Surgery  07/18/17   Latanya Maudlin, MD Consulting Physician Orthopedic Surgery  07/18/17   Kathrynn Ducking, MD Consulting Physician Neurology  12/16/18   Okey Regal, [place] Physician Optometry  12/16/18    Comment: Richarda Osmond, [facility] Pharmacist Pharmacist  05/24/19    Comment: PHONE NUMBER [phone]  Cameron Sprang, MD Consulting Physician Neurology  10/19/19    Current Diagnosis/Assessment: Goals Addressed   None  Hypertension   BP Readings from Last 3 Encounters:  11/30/19 120/70  11/24/19 112/70  11/19/19 (!) 160/98   BP controlled at recent visits. Denies dizziness, SOB, chest pain. Patient checks BP at home infrequently. Patient is currently controlled on the following medications:  . Triamterene-hydrochlorothiazide 37.5-25 mg once daily  Plan  Continue current medications.    Diabetes   Recent Relevant Labs:  Lab Results  Component Value Date/Time   HGBA1C 6.6 (H) 11/30/2019 04:09 PM   HGBA1C 7.0 (H) 08/12/2019 11:58 AM   EGFR 60 (L) 06/30/2014 02:48 PM   MICROALBUR 0.4 03/12/2019 01:54 PM    Patient is currently controlled on the following medications:  Marland Kitchen Metformin 500 mg tablet - take 2 tablets by mouth daily with a meal  We discussed: dietary recommendations.  Plan  Continue current medications   COPD   Gold Grade: Gold 2 (FEV1 50-79%).   Lab Results  Component Value Date/Time   EOSPCT 3.1 03/12/2019 01:54 PM   EOSPCT 8.6 (H) 06/30/2014 02:48 PM   Lab Results  Component Value Date/Time   EOSABS 285 03/12/2019 01:54 PM   EOSABS 0.7 (H) 06/30/2014 02:48 PM   Patient is currently taking the following medications:   Tiotropium Bromide Monohydrate (SPIRIVA RESPIMAT) 2.5 MCG/ACT AERS 2 puffs daily   Budesonide-formoterol (SYMBICORT) 160-4.5 MCG/ACT inhale 2 puffs into the lungs two times daily - rinse after use   Albuterol rescue inhaler 2 puffs every 4 hours as needed  We discussed: PAP for inhalers.  Plan  Continue current medications.  Tobacco Abuse   Tobacco Status:  Social History   Tobacco Use  Smoking Status Current Every Day Smoker  . Packs/day: 1.50  . Years: 46.00  . Pack years: 69.00  . Types: Cigarettes  Smokeless Tobacco Never Used    1-1.5 ppd currently, does not chew gum, has tried a patch. Previously tried cold [place]. Patient smokes Within 30 minutes of waking. Patient triggers include: stress. Not motivated to quit at this time.   We discussed: Smoking cessation options.  Plan  Discuss at 31-monthf/u.  Hyperlipidemia   Lipid Panel     Component Value Date/Time   CHOL 98 11/30/2019 1609   TRIG 193.0 (H) 11/30/2019 1609   HDL 31.90 (L) 11/30/2019 1609   LDLCALC 27 11/30/2019 1609   LDLDIRECT 72.0 08/31/2018 1411    The ASCVD Risk score (Goff DC Jr., et al., 2013) failed to calculate for the following reasons:   The valid total cholesterol range is 130 to 320 mg/dL   Denies any muscle or abdominal pain or n/v. Patient is currently controlled on the following medications:  . Atorvastatin 20 mg daily  Plan  Continue current medications.  Hypothyroidism   Lab Results  Component Value Date/Time   TSH 4.39 11/04/2019 01:42 PM   TSH 5.85 (H) 08/12/2019 11:58 AM   TSH 4.89 (H) 03/12/2019 01:54 PM   Currently taking: . Levothyroxine 75 mcg daily  Plan  Continue current medications.  GERD   Patient denies acid reflux. Currently controlled on: . Omeprazole 40 mg twice daily   Plan   Continue current medication.  Medication Management   I performed an extensive review of her medications.  Receives prescription medications from:  [facility].  Denies any issues with current medication management.   Plan  Continue current medication management strategy.  Follow up:  RPH: 2 month phone visit. Review insurance options for smoking cessation.  CPA: Send pt PAP applications on Albuterol, Symbicort, Spiriva  inhalers. ______________ Visit Information SDOH (Social Determinants of Health) assessments performed: Yes.  Future Appointments  Date Time Provider [place]  12/02/2019 11:00 AM [facility] PHARMACIST [facility]  12/15/2019  1:00 PM Alphonzo Severance [facility] None   12/15/2019  2:00 PM [facility] NEUROPSYCH TECH2 [facility] None  12/16/2019  1:40 PM Norman Clay, MD [facility] None  12/22/2019  2:30 PM Alphonzo Severance [facility] None   [name], Pharm.D., BCGP Clinical Pharmacist [facility] [phone]

## 2019-12-02 NOTE — Chronic Care Management (AMB) (Signed)
  Chronic Care Management   Outreach Note   Name: [name] MRN: [id number] DOB: 1955-02-16  Referred by: Brunetta Jeans, PA-C Reason for referral: Telephone Appointment with [place] Pharmacist, [name].   An unsuccessful telephone outreach was attempted today. The patient was referred to the pharmacist for assistance with care management and care coordination.   Telephone appointment with clinical pharmacist today (12/02/2019) at 11am. If patient immediately returns call, transfer to [phone]. Otherwise, please provide this number so patient can reschedule visit.   [name], Pharm.D., BCGP Clinical Pharmacist [facility] [phone]

## 2019-12-06 ENCOUNTER — Encounter: Payer: Medicare Other | Admitting: Counselor
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
## 2019-12-08 ENCOUNTER — Ambulatory Visit: Payer: Self-pay

## 2019-12-13 ENCOUNTER — Encounter: Payer: Medicare Other | Admitting: Counselor

## 2019-12-15 ENCOUNTER — Encounter: Payer: Medicare Other | Admitting: Counselor

## 2019-12-16 ENCOUNTER — Telehealth: Payer: Medicare Other | Admitting: Psychiatry

## 2019-12-16 ENCOUNTER — Telehealth (HOSPITAL_COMMUNITY): Payer: Medicare Other | Admitting: Psychiatry

## 2019-12-17 ENCOUNTER — Other Ambulatory Visit: Payer: Self-pay | Admitting: Physician Assistant

## 2019-12-17 NOTE — Telephone Encounter (Signed)
Xanax last rx 11/17/19 #120 LOV: 11/30/19 DM CSC: 08/21/17

## 2019-12-22 ENCOUNTER — Encounter: Payer: Medicare Other | Admitting: Counselor

## 2019-12-24 NOTE — Progress Notes (Signed)
Subjective:   [name] is a 64 y.o. female who presents for Medicare Annual (Subsequent) preventive examination.  I connected with Shreena today by telephone and verified that I am speaking with the correct person using two identifiers. Location patient: home Location provider: work Persons participating in the virtual visit: patient, Marine scientist.    I discussed the limitations, risks, security and privacy concerns of performing an evaluation and management service by telephone and the availability of in person appointments. I also discussed with the patient that there may be a patient responsible charge related to this service. The patient expressed understanding and verbally consented to this telephonic visit.    Interactive audio and video telecommunications were attempted between this provider and patient, however failed, due to patient having technical difficulties OR patient did not have access to video capability.  We continued and completed visit with audio only.  Some vital signs may be absent or patient reported.   Time Spent with patient on telephone encounter: 30 minutes   Review of Systems     Cardiac Risk Factors include: diabetes mellitus;dyslipidemia;hypertension;obesity (BMI >30kg/m2);smoking/ tobacco exposure;sedentary lifestyle     Objective:    Today's Vitals   12/27/19 1330  Weight: 221 lb (100.2 kg)  Height: 5' 2.5" (1.588 m)  PainSc: 6    Body mass index is 39.78 kg/m.  Advanced Directives 12/27/2019 10/19/2019 07/07/2019 04/29/2019 03/05/2019 12/28/2018 12/16/2018  Does Patient Have a Medical Advance Directive? Yes Yes Yes No Yes Yes Yes  Type of Paramedic of [place];Living will [place];Out of facility DNR (pink MOST or yellow form);Living will [place];Living will - Healthcare Power of [place];Living will [place];Living will  Does patient want  to make changes to medical advance directive? - - No - Patient declined - - - No - Patient declined  Copy of [place] in Chart? Yes - validated most recent copy scanned in chart (See row information) - No - copy requested - - - Yes - validated most recent copy scanned in chart (See row information)  Would patient like information on creating a medical advance directive? - - - - - - -  Pre-existing out of facility DNR order (yellow form or pink MOST form) - - - - - - -    Current Medications (verified) Outpatient Encounter Medications as of 12/27/2019  Medication Sig  . albuterol (PROAIR HFA) 108 (90 Base) MCG/ACT inhaler 2 puffs every 4 hours as needed only  if your can't catch your breath (Patient taking differently: Inhale 2 puffs into the lungs every 4 (four) hours as needed for wheezing or shortness of breath. if your can't catch your breath)  . ALPRAZolam (XANAX) 1 MG tablet TAKE 1 TABLET BY MOUTH EVERY MORNING, ONE TABLET AT NOON, ONE TABLET EVERY EVENING, AND ONE TABLET AT BEDTIME  . atorvastatin (LIPITOR) 20 MG tablet TAKE 1 TABLET BY MOUTH AT BEDTIME  . fluticasone (FLONASE) 50 MCG/ACT nasal spray Place 2 sprays into both nostrils daily.  . furosemide (LASIX) 20 MG tablet TAKE 1 TABLET BY MOUTH EVERY DAY AS NEEDED  . glucose blood (ACCU-CHEK GUIDE) test strip 1 each by Other route 3 (three) times daily as needed for other. as directed Dx E11.9  . HYDROcodone-acetaminophen (NORCO) 10-325 MG tablet Take 1 tablet by mouth every 8 (eight) hours as needed.  Marland Kitchen levothyroxine (SYNTHROID) 100 MCG tablet TAKE 1 TABLET BY MOUTH EVERY DAY  BEFORE BREAKFAST  . loperamide (IMODIUM A-D) 2 MG tablet Take 1 tablet (2 mg total) by mouth in the morning and at bedtime.  Marland Kitchen loratadine (CLARITIN) 10 MG tablet Take 10 mg by mouth daily.  Marland Kitchen omeprazole (PRILOSEC) 40 MG capsule TAKE ONE CAPSULE BY MOUTH DAILY BEFORE BREAKFAST and TAKE ONE CAPSULE DAILY BEFORE DINNER  . ondansetron (ZOFRAN) 4 MG  tablet Take 1 tablet (4 mg total) by mouth as needed for nausea.  . OXYGEN Inhale 2 L into the lungs at bedtime.   . potassium chloride (KLOR-CON) 10 MEQ tablet TAKE 1 TABLET BY MOUTH AT BEDTIME  . SYMBICORT 160-4.5 MCG/ACT inhaler USE 2 INHALATIONS BY MOUTH  TWICE DAILY  . Tiotropium Bromide Monohydrate (SPIRIVA RESPIMAT) 2.5 MCG/ACT AERS Inhale 2 puffs into the lungs daily.  Marland Kitchen triamterene-hydrochlorothiazide (MAXZIDE-25) 37.5-25 MG tablet TAKE 1 TABLET BY MOUTH EVERY DAY  . venlafaxine XR (EFFEXOR XR) 75 MG 24 hr capsule 225 mg daily. Take along with 150 mg tab  . venlafaxine XR (EFFEXOR-XR) 150 MG 24 hr capsule 225 mg daily. Take along with 75 mg tab   No facility-administered encounter medications on file as of 12/27/2019.    Allergies (verified) Nsaids, Aleve [naproxen sodium], Codeine, Penicillins, and Sulfonamide derivatives   History: Past Medical History:  Diagnosis Date  . Allergic rhinitis   . Anemia   . Anxiety   . Chicken pox   . Chronic back pain   . COPD (chronic obstructive pulmonary disease) ([place])   . Depression   . DM (diabetes mellitus) ([place])   . Essential hypertension   . GERD (gastroesophageal reflux disease)   . Headache    migraines  . History of gastritis    EGD 2015  . History of home oxygen therapy    2 liters at hs last 6 months  . Hyperlipidemia   . Hypothyroidism   . Migraines   . Osteoarthritis    oa  . Scoliosis    Past Surgical History:  Procedure Laterality Date  . APPENDECTOMY     1985  . BIOPSY  07/24/2017   Procedure: BIOPSY;  Surgeon: Milus Banister, MD;  Location: [facility];  Service: Endoscopy;;  . CARDIAC CATHETERIZATION N/A 10/31/2015   Procedure: Left Heart Cath and Coronary Angiography;  Surgeon: Leonie Man, MD;  Location: [facility];  Service: Cardiovascular;  Laterality: N/A;  . CARPAL TUNNEL RELEASE Left   . CARPAL TUNNEL RELEASE Right   . CHOLECYSTECTOMY  late 1980's  . COLONOSCOPY WITH PROPOFOL N/A  07/24/2017   Procedure: COLONOSCOPY WITH PROPOFOL;  Surgeon: Milus Banister, MD;  Location: [facility];  Service: Endoscopy;  Laterality: N/A;  . ESOPHAGOGASTRODUODENOSCOPY N/A 07/24/2017   Procedure: ESOPHAGOGASTRODUODENOSCOPY (EGD);  Surgeon: Milus Banister, MD;  Location: [facility];  Service: Endoscopy;  Laterality: N/A;  . GALLBLADDER SURGERY  1991  . HIP CLOSED REDUCTION Right 01/08/2016   Procedure: CLOSED MANIPULATION HIP;  Surgeon: Susa Day, MD;  Location: [facility];  Service: Orthopedics;  Laterality: Right;  . HIP CLOSED REDUCTION Right 01/19/2016   Procedure: ATTEMPTED CLOSED REDUCTION RIGHT HIP;  Surgeon: Wylene Simmer, MD;  [facility]: [facility];  Service: Orthopedics;  Laterality: Right;  . HIP CLOSED REDUCTION Right 01/20/2016   Procedure: CLOSED REDUCTION RIGHT TOTAL HIP;  Surgeon: Paralee Cancel, MD;  [facility];  Service: Orthopedics;  Laterality: Right;  . HIP CLOSED REDUCTION Right 02/17/2016   Procedure: CLOSED REDUCTION RIGHT TOTAL HIP;  Surgeon: Rod Can, MD;  Location: [facility];  Service: Orthopedics;  Laterality: Right;  . HIP CLOSED REDUCTION Right 02/28/2016   Procedure: CLOSED REDUCTION HIP;  Surgeon: Nicholes Stairs, MD;  [facility];  Service: Orthopedics;  Laterality: Right;  . POLYPECTOMY  07/24/2017   Procedure: POLYPECTOMY;  Surgeon: Milus Banister, MD;  [facility];  Service: Endoscopy;;  . TONSILLECTOMY    . TOTAL ABDOMINAL HYSTERECTOMY     1985, with 1 ovary removed and 2 nd ovary removed 2003  . TOTAL HIP ARTHROPLASTY Right    Original surgery 2006 with revision 2010  . TOTAL HIP REVISION Right 01/01/2016   Procedure: TOTAL HIP REVISION;  Surgeon: Paralee Cancel, MD;  [facility];  Service: Orthopedics;  Laterality: Right;  . TOTAL HIP REVISION Right 03/02/2016   Procedure: TOTAL HIP REVISION;  Surgeon: Paralee Cancel, MD;  [facility];  Service: Orthopedics;  Laterality: Right;  . TOTAL HIP REVISION Right 09/02/2016    Procedure: Right hip constrained liner- posterior;  Surgeon: Paralee Cancel, MD;  Location[facility];  Service: Orthopedics;  Laterality: Right;  . ULNAR NERVE TRANSPOSITION Right    Family History  Problem Relation Age of Onset  . COPD Mother   . Heart disease Mother   . Lung disease Father        Asbestosis  . Heart attack Father   . Heart disease Father   . Cerebral aneurysm Brother   . Aneurysm Brother        Brain  . Epilepsy Son   . Alcohol abuse Son   . Arthritis Maternal Grandmother   . Heart disease Maternal Grandmother   . Asthma Maternal Grandfather   . Cancer Maternal Grandfather   . Arthritis Paternal Grandmother   . Heart disease Paternal Grandmother   . Stroke Paternal Grandmother   . Early death Paternal Grandfather   . Heart disease Paternal Grandfather    Social History   Socioeconomic History  . Marital status: Married    Spouse name: Not on file  . Number of children: Not on file  . Years of education: Not on file  . Highest education level: Not on file  Occupational History  . Not on file  Tobacco Use  . Smoking status: Current Every Day Smoker    Packs/day: 1.50    Years: 46.00    Pack years: 69.00    Types: Cigarettes  . Smokeless tobacco: Never Used  Vaping Use  . Vaping Use: Never used  Substance and Sexual Activity  . Alcohol use: No  . Drug use: No  . Sexual activity: Yes  Other Topics Concern  . Not on file  Social History Narrative   Right handed    Caffeine~ 2 cups per day    Lives at home with husband (strained relationship)   Primary caretaker for disabled brother who had aneurism   Daughter died 06-17-18    Social Determinants of Health   Financial Resource Strain: Low Risk   . Difficulty of Paying Living Expenses: Not hard at all  Food Insecurity: No Food Insecurity  . Worried About Charity fundraiser in the Last Year: Never true  . Ran Out of Food in the Last Year: Never true  Transportation Needs: No Transportation  Needs  . Lack of Transportation (Medical): No  . Lack of Transportation (Non-Medical): No  Physical Activity: Inactive  . Days of Exercise per Week: 0 days  . Minutes of Exercise per Session: 0 min  Stress: Stress Concern Present  . Feeling of Stress : To some extent  Social Connections: Moderately Integrated  . Frequency of Communication with Friends and Family: More than three times a week  . Frequency of Social Gatherings with Friends and Family: Once a week  . Attends Religious Services: 1 to 4 times per year  . Active Member of Clubs or Organizations: No  . Attends Archivist Meetings: Never  . Marital Status: Married    Tobacco Counseling Ready to quit: Not Answered Counseling given: Not Answered   Clinical Intake:  Pre-visit preparation completed: Yes  Pain : 0-10 Pain Score: 6  Pain Type: Chronic pain Pain Location: Back Pain Onset: More than a month ago Pain Frequency: Constant     Nutritional Status: BMI > 30  Obese Nutritional Risks: None Diabetes: Yes CBG done?: No Did pt. bring in CBG monitor from home?: No (phone visit)  How often do you need to have someone help you when you read instructions, pamphlets, or other written materials from your doctor or pharmacy?: 1 - Never What is the last grade level you completed in school?: 1 yr of college  Diabetes:  Is the patient diabetic?  Yes  If diabetic, was a CBG obtained today?  No  Did the patient bring in their glucometer from home?  No  phone visit How often do you monitor your CBG's? daily.   Financial Strains and Diabetes Management:  Are you having any financial strains with the device, your supplies or your medication? No .  Does the patient want to be seen by Chronic Care Management for management of their diabetes?  No  Would the patient like to be referred to a Nutritionist or for Diabetic Management?  No   Diabetic Exams:  Diabetic Eye Exam:  Overdue for diabetic eye exam. Pt has  been advised about the importance in completing this exam. A referral has been placed today. Patient to schedule an appt  Diabetic Foot Exam:  Pt has been advised about the importance in completing this exam. To be completed by PCP    Interpreter Needed?: No  Information entered by :: Caroleen Hamman LPN   Activities of Daily Living In your present state of health, do you have any difficulty performing the following activities: 12/27/2019  Hearing? Y  Comment mild  Vision? N  Difficulty concentrating or making decisions? N  Walking or climbing stairs? N  Dressing or bathing? N  Doing errands, shopping? N  Preparing Food and eating ? N  Using the Toilet? N  In the past six months, have you accidently leaked urine? Y  Comment occasionally  Do you have problems with loss of bowel control? N  Managing your Medications? N  Managing your Finances? N  Housekeeping or managing your Housekeeping? N  Some recent data might be hidden    Patient Care Team: Delorse Limber as PCP - General (Family Medicine) Milus Banister, MD as Attending Physician (Gastroenterology) Tanda Rockers, MD as Consulting Physician (Pulmonary Disease) Dr. Darylene Price, MD as Consulting Physician (Orthopedic Surgery) Latanya Maudlin, MD as Consulting Physician (Orthopedic Surgery) Kathrynn Ducking, MD as Consulting Physician (Neurology) Okey Regal, [place] as Consulting Physician (Optometry) [name], [employer] as Pharmacist (Pharmacist) Cameron Sprang, MD as Consulting Physician (Neurology)  Indicate any recent Medical Services you may have received from other than [facility] providers in the past year (date may be approximate).     Assessment:  This is a routine wellness examination for [name].  Hearing/Vision screen  Hearing Screening   125Hz  250Hz  500Hz  1000Hz  2000Hz  3000Hz  4000Hz  6000Hz  8000Hz   Right ear:           Left ear:           Comments: Mild hearing loss  Vision  Screening Comments: Wears glasses  Last eye exam-10/2018-[facility]  Dietary issues and exercise activities discussed: Exercise limited by: None identified  Goals    . Patient Stated     Maintain healthy eating & drinking water    . PharmD Care Plan     CARE PLAN ENTRY (see longitudinal plan of care for additional care plan information)  Current Barriers:  . Chronic Disease Management support, education, and care coordination needs related to Hypertension, Hyperlipidemia, Diabetes, and GERD   Hypertension BP Readings from Last 3 Encounters:  08/05/19 126/78  07/27/19 132/76  07/09/19 130/82   . Pharmacist Clinical Goal(s):  o Over the next 180 days, patient will work with PharmD and providers to maintain BP goal <140/90 . Current regimen:  o Triamterence-hydrochlorothiazide 37.5-25 mg once daily . Interventions: o Continue current management . Patient self care activities - Over the next 180 days, patient will: o Check BP once every 1-2 weeks, document, and provide at future appointments o Ensure daily salt intake < 2300 mg/day  Hyperlipidemia Lab Results  Component Value Date/Time   LDLCALC 61 11/04/2018 03:22 PM   LDLDIRECT 72.0 08/31/2018 02:11 PM   . Pharmacist Clinical Goal(s): o Over the next 180 days, patient will work with PharmD and providers to maintain LDL goal < 100 . Current regimen:  o Atorvastatin 20 mg daily  . Interventions: o Continue current management . Patient self care activities - Over the next 180 days, patient will: o Continue current management  Diabetes Lab Results  Component Value Date/Time   HGBA1C 9.3 (A) 03/22/2019 02:44 PM   . Pharmacist Clinical Goal(s): o Over the next 180 days, patient will work with PharmD and providers to achieve A1c goal <7% . Current regimen:  o Metformin 500 mg tablet - take two tablets every morning with breakfast . Interventions: o Continue current management o Limit carbohydrates in diet . Patient  self care activities - Over the next 180 days, patient will: o Check blood sugar once daily, document, and provide at future appointments o Contact provider with any episodes of hypoglycemia  GERD . Pharmacist Clinical Goal(s) o Over the next 180 days, patient will work with PharmD and providers to minimize GERD related symptoms . Current regimen:  o Omeprazole 40 mg twice daily  . Interventions: o Continue current management . Patient self care activities - Over the next 180 days, patient will: o Continue current management  Medication management . Pharmacist Clinical Goal(s): o Over the next 180 days, patient will work with PharmD and providers to maintain optimal medication adherence . Current pharmacy: [facility] . Interventions o Comprehensive medication review performed. o Continue current medication management strategy . Patient self care activities - Over the next 180 days, patient will: o Take medications as prescribed o Report any questions or concerns to PharmD and/or provider(s)  Initial goal documentation       Depression Screen PHQ 2/9 Scores 12/27/2019 11/30/2019 08/11/2019 07/27/2019 04/12/2019 12/16/2018 11/10/2018  PHQ - 2 Score 2 3 6 6 2 5 1   PHQ- 9 Score 6 5 24 24 2 13 3     Fall Risk Fall Risk  12/27/2019 10/19/2019 07/09/2019 12/16/2018 05/25/2018  Falls in the past year? 1 1 1  0 0  Number falls in past yr: 1 1 1  - 0  Injury with Fall? 0 1 1 0 0  Risk for fall due to : History of fall(s) Impaired balance/gait;Impaired mobility - History of fall(s);Impaired mobility;Orthopedic patient Impaired mobility  Risk for fall due to: Comment - - - - Knee pain  Follow up Falls prevention discussed - - Falls evaluation completed;Education provided;Falls prevention discussed Falls evaluation completed    Any stairs in or around the home? No  Home free of loose throw rugs in walkways, pet beds, electrical cords, etc? Yes  Adequate lighting in your home to  reduce risk of falls? Yes   ASSISTIVE DEVICES UTILIZED TO PREVENT FALLS:  Life alert? No  Use of a cane, walker or w/c? Yes  Grab bars in the bathroom? Yes  Shower chair or bench in shower? No  Elevated toilet seat or a handicapped toilet? No   TIMED UP AND GO:  Was the test performed? No . Phone visit   Cognitive Function:No cognitive impairment noted   Montreal Cognitive Assessment  10/19/2019  Visuospatial/ Executive (0/5) 3  Naming (0/3) 3  Attention: Read list of digits (0/2) 2  Attention: Read list of letters (0/1) 1  Attention: Serial 7 subtraction starting at 100 (0/3) 1  Language: Repeat phrase (0/2) 2  Language : Fluency (0/1) 1  Abstraction (0/2) 1  Delayed Recall (0/5) 5  Orientation (0/6) 5  Total 24  Adjusted Score (based on education) 24      Immunizations Immunization History  Administered Date(s) Administered  . Influenza,inj,Quad PF,6+ Mos 10/02/2018, 11/30/2019  . Influenza-Unspecified 01/16/2011, 11/26/2011, 12/22/2012, 11/17/2018  . PFIZER SARS-COV-2 Vaccination 10/20/2019, 11/10/2019  . Td 06/07/2005    TDAP status: Due, Education has been provided regarding the importance of this vaccine. Advised may receive this vaccine at local pharmacy or Health Dept. Aware to provide a copy of the vaccination record if obtained from local pharmacy or Health Dept. Verbalized acceptance and understanding.   Flu Vaccine status: Up to date   Pneumococcal vaccine status: Pneumovax-23 due-Patient to discuss with PCP at next office visit  Covid-19 vaccine status: Completed vaccines  Qualifies for Shingles Vaccine? Yes   Zostavax completed No   Shingrix Completed?: No.    Education has been provided regarding the importance of this vaccine. Patient has been advised to call insurance company to determine out of pocket expense if they have not yet received this vaccine. Advised may also receive vaccine at local pharmacy or Health Dept. Verbalized acceptance and  understanding.  Screening Tests Health Maintenance  Topic Date Due  . Hepatitis C Screening  Never done  . PNEUMOCOCCAL POLYSACCHARIDE VACCINE AGE 100-64 HIGH RISK  Never done  . FOOT EXAM  Never done  . OPHTHALMOLOGY EXAM  Never done  . HIV Screening  Never done  . TETANUS/TDAP  06/08/2015  . MAMMOGRAM  05/28/2019  . DTAP VACCINES (1) 07/15/2078 (Originally 03/29/1955)  . URINE MICROALBUMIN  03/11/2020  . HEMOGLOBIN A1C  05/29/2020  . COLONOSCOPY  07/25/2027  . INFLUENZA VACCINE  Completed  . COVID-19 Vaccine  Completed  . DTaP/Tdap/Td  Discontinued    Health Maintenance  Health Maintenance Due  Topic Date Due  . Hepatitis C Screening  Never done  . PNEUMOCOCCAL POLYSACCHARIDE VACCINE AGE 100-64 HIGH RISK  Never done  . FOOT EXAM  Never done  . OPHTHALMOLOGY EXAM  Never done  .  HIV Screening  Never done  . TETANUS/TDAP  06/08/2015  . MAMMOGRAM  05/28/2019    Colorectal cancer screening: Completed Colonoscopy 07/24/2017. Repeat every 5 years   Mammogram status: Due-Patient  Declined  Bone Density status: Completed 06/17/2017. Results indicate Osteopenia.  Lung Cancer Screening: (Low Dose CT Chest recommended if Age 63-80 years, 30 pack-year currently smoking OR have quit w/in 15years.) does qualify.   Lung Cancer Screening Referral: Patient declined  Additional Screening:  Hepatitis C Screening: does qualify; Discuss with PCP  Vision Screening: Recommended annual ophthalmology exams for early detection of glaucoma and other disorders of the eye. Is the patient up to date with their annual eye exam?  No  Who is the provider or what is the name of the office in which the patient attends annual eye exams? [facility]   Dental Screening: Recommended annual dental exams for proper oral hygiene  Community Resource Referral / Chronic Care Management: CRR required this visit?  No   CCM required this visit?  No      Plan:     I have personally reviewed and noted the  following in the patient's chart:   . Medical and social history . Use of alcohol, tobacco or illicit drugs  . Current medications and supplements . Functional ability and status . Nutritional status . Physical activity . Advanced directives . List of other physicians . Hospitalizations, surgeries, and ER visits in previous 12 months . Vitals . Screenings to include cognitive, depression, and falls . Referrals and appointments  In addition, I have reviewed and discussed with patient certain preventive protocols, quality metrics, and best practice recommendations. A written personalized care plan for preventive services as well as general preventive health recommendations were provided to patient.   Due to this being a telephonic visit, the after visit summary with patients personalized plan was offered to patient via mail or my-chart. Patient would like to access on my-chart   Marta Antu, [place]   [id number]  Nurse Health Advisor  Nurse Notes: Patient's depression score a 6 today. She states she doesn't feel like the Effexor is working as well as it should. Appt made to see PCP on 01/11/20.

## 2019-12-27 ENCOUNTER — Ambulatory Visit (INDEPENDENT_AMBULATORY_CARE_PROVIDER_SITE_OTHER): Payer: Medicare Other

## 2019-12-27 ENCOUNTER — Other Ambulatory Visit: Payer: Self-pay

## 2019-12-27 VITALS — Ht 62.5 in | Wt 221.0 lb

## 2019-12-27 DIAGNOSIS — Z0000 Encounter for general adult medical examination without abnormal findings: Secondary | ICD-10-CM

## 2019-12-27 NOTE — Patient Instructions (Addendum)
[name] , Thank you for taking time to complete your Medicare Wellness Visit. I appreciate your ongoing commitment to your health goals. Please review the following plan we discussed and let me know if I can assist you in the future.   Screening recommendations/referrals: Colonoscopy: Completed 07/24/2017-Due 07/25/2022 Mammogram: Due- Declined today. Please call the office to schedule if you change your mind. Bone Density: Completed 06/17/2017 Recommended yearly ophthalmology/optometry visit for glaucoma screening and checkup Recommended yearly dental visit for hygiene and checkup  Vaccinations: Influenza vaccine: Up to date. Pneumococcal vaccine: Due for Pneumovax-23 Tdap vaccine: Discuss with pharmacy Shingles vaccine: Discuss with pharmacy  Covid-19: Completed vaccines  Advanced directives: Copy in chart  Conditions/risks identified: See problem list  Next appointment: Follow up in one year for your annual wellness visit.   Preventive Care 40-64 Years, Female Preventive care refers to lifestyle choices and visits with your health care provider that can promote health and wellness. What does preventive care include?  A yearly physical exam. This is also called an annual well check.  Dental exams once or twice a year.  Routine eye exams. Ask your health care provider how often you should have your eyes checked.  Personal lifestyle choices, including:  Daily care of your teeth and gums.  Regular physical activity.  Eating a healthy diet.  Avoiding tobacco and drug use.  Limiting alcohol use.  Practicing safe sex.  Taking low-dose aspirin daily starting at age 47.  Taking vitamin and mineral supplements as recommended by your health care provider. What happens during an annual well check? The services and screenings done by your health care provider during your annual well check will depend on your age, overall health, lifestyle risk factors, and family history of  disease. Counseling  Your health care provider may ask you questions about your:  Alcohol use.  Tobacco use.  Drug use.  Emotional well-being.  Home and relationship well-being.  Sexual activity.  Eating habits.  Work and work Statistician.  Method of birth control.  Menstrual cycle.  Pregnancy history. Screening  You may have the following tests or measurements:  Height, weight, and BMI.  Blood pressure.  Lipid and cholesterol levels. These may be checked every 5 years, or more frequently if you are over 40 years old.  Skin check.  Lung cancer screening. You may have this screening every year starting at age 63 if you have a 30-pack-year history of smoking and currently smoke or have quit within the past 15 years.  Fecal occult blood test (FOBT) of the stool. You may have this test every year starting at age 52.  Flexible sigmoidoscopy or colonoscopy. You may have a sigmoidoscopy every 5 years or a colonoscopy every 10 years starting at age 34.  Hepatitis C blood test.  Hepatitis B blood test.  Sexually transmitted disease (STD) testing.  Diabetes screening. This is done by checking your blood sugar (glucose) after you have not eaten for a while (fasting). You may have this done every 1-3 years.  Mammogram. This may be done every 1-2 years. Talk to your health care provider about when you should start having regular mammograms. This may depend on whether you have a family history of breast cancer.  BRCA-related cancer screening. This may be done if you have a family history of breast, ovarian, tubal, or peritoneal cancers.  Pelvic exam and Pap test. This may be done every 3 years starting at age 46. Starting at age 106, this may be done  every 5 years if you have a Pap test in combination with an HPV test.  Bone density scan. This is done to screen for osteoporosis. You may have this scan if you are at high risk for osteoporosis. Discuss your test results,  treatment options, and if necessary, the need for more tests with your health care provider. Vaccines  Your health care provider may recommend certain vaccines, such as:  Influenza vaccine. This is recommended every year.  Tetanus, diphtheria, and acellular pertussis (Tdap, Td) vaccine. You may need a Td booster every 10 years.  Zoster vaccine. You may need this after age 83.  Pneumococcal 13-valent conjugate (PCV13) vaccine. You may need this if you have certain conditions and were not previously vaccinated.  Pneumococcal polysaccharide (PPSV23) vaccine. You may need one or two doses if you smoke cigarettes or if you have certain conditions. Talk to your health care provider about which screenings and vaccines you need and how often you need them. This information is not intended to replace advice given to you by your health care provider. Make sure you discuss any questions you have with your health care provider. Document Released: 02/10/2015 Document Revised: 10/04/2015 Document Reviewed: 11/15/2014 Elsevier Interactive Patient Education  2017 [employer] Prevention in the Home Falls can cause injuries. They can happen to people of all ages. There are many things you can do to make your home safe and to help prevent falls. What can I do on the outside of my home?  Regularly fix the edges of walkways and driveways and fix any cracks.  Remove anything that might make you trip as you walk through a door, such as a raised step or threshold.  Trim any bushes or trees on the path to your home.  Use bright outdoor lighting.  Clear any walking paths of anything that might make someone trip, such as rocks or tools.  Regularly check to see if handrails are loose or broken. Make sure that both sides of any steps have handrails.  Any raised decks and porches should have guardrails on the edges.  Have any leaves, snow, or ice cleared regularly.  Use sand or salt on walking  paths during winter.  Clean up any spills in your garage right away. This includes oil or grease spills. What can I do in the bathroom?  Use night lights.  Install grab bars by the toilet and in the tub and shower. Do not use towel bars as grab bars.  Use non-skid mats or decals in the tub or shower.  If you need to sit down in the shower, use a plastic, non-slip stool.  Keep the floor dry. Clean up any water that spills on the floor as soon as it happens.  Remove soap buildup in the tub or shower regularly.  Attach bath mats securely with double-sided non-slip rug tape.  Do not have throw rugs and other things on the floor that can make you trip. What can I do in the bedroom?  Use night lights.  Make sure that you have a light by your bed that is easy to reach.  Do not use any sheets or blankets that are too big for your bed. They should not hang down onto the floor.  Have a firm chair that has side arms. You can use this for support while you get dressed.  Do not have throw rugs and other things on the floor that can make you trip. What can  I do in the kitchen?  Clean up any spills right away.  Avoid walking on wet floors.  Keep items that you use a lot in easy-to-reach places.  If you need to reach something above you, use a strong step stool that has a grab bar.  Keep electrical cords out of the way.  Do not use floor polish or wax that makes floors slippery. If you must use wax, use non-skid floor wax.  Do not have throw rugs and other things on the floor that can make you trip. What can I do with my stairs?  Do not leave any items on the stairs.  Make sure that there are handrails on both sides of the stairs and use them. Fix handrails that are broken or loose. Make sure that handrails are as long as the stairways.  Check any carpeting to make sure that it is firmly attached to the stairs. Fix any carpet that is loose or worn.  Avoid having throw rugs at  the top or bottom of the stairs. If you do have throw rugs, attach them to the floor with carpet tape.  Make sure that you have a light switch at the top of the stairs and the bottom of the stairs. If you do not have them, ask someone to add them for you. What else can I do to help prevent falls?  Wear shoes that:  Do not have high heels.  Have rubber bottoms.  Are comfortable and fit you well.  Are closed at the toe. Do not wear sandals.  If you use a stepladder:  Make sure that it is fully opened. Do not climb a closed stepladder.  Make sure that both sides of the stepladder are locked into place.  Ask someone to hold it for you, if possible.  Clearly mark and make sure that you can see:  Any grab bars or handrails.  First and last steps.  Where the edge of each step is.  Use tools that help you move around (mobility aids) if they are needed. These include:  Canes.  Walkers.  Scooters.  Crutches.  Turn on the lights when you go into a dark area. Replace any light bulbs as soon as they burn out.  Set up your furniture so you have a clear path. Avoid moving your furniture around.  If any of your floors are uneven, fix them.  If there are any pets around you, be aware of where they are.  Review your medicines with your doctor. Some medicines can make you feel dizzy. This can increase your chance of falling. Ask your doctor what other things that you can do to help prevent falls. This information is not intended to replace advice given to you by your health care provider. Make sure you discuss any questions you have with your health care provider. Document Released: 11/10/2008 Document Revised: 06/22/2015 Document Reviewed: 02/18/2014 Elsevier Interactive Patient Education  2017 [employer].

## 2019-12-29 ENCOUNTER — Other Ambulatory Visit: Payer: Self-pay | Admitting: Gastroenterology

## 2019-12-30 ENCOUNTER — Other Ambulatory Visit: Payer: Self-pay | Admitting: Physician Assistant

## 2019-12-30 DIAGNOSIS — G894 Chronic pain syndrome: Secondary | ICD-10-CM

## 2019-12-30 NOTE — Telephone Encounter (Signed)
Last refill: 11/16/19 #90, 0 Last OV: 11/30/19 dx. DM f/u

## 2020-01-03 ENCOUNTER — Telehealth: Payer: Self-pay

## 2020-01-03 NOTE — Progress Notes (Unsigned)
Chronic Care Management Pharmacy Assistant   Name: [name]  MRN: [id number] DOB: 01-05-1956  Reason for Encounter: Disease State  PCP : Brunetta Jeans, PA-C  Allergies:   Allergies  Allergen Reactions   Nsaids Diarrhea   Aleve [Naproxen Sodium] Other (See Comments)    Headache    Codeine Nausea Only and Other (See Comments)    GI upset   Penicillins Nausea Only and Other (See Comments)    GI upset    Sulfonamide Derivatives Hives    Medications: Outpatient Encounter Medications as of 01/03/2020  Medication Sig   albuterol (PROAIR HFA) 108 (90 Base) MCG/ACT inhaler 2 puffs every 4 hours as needed only  if your can't catch your breath (Patient taking differently: Inhale 2 puffs into the lungs every 4 (four) hours as needed for wheezing or shortness of breath. if your can't catch your breath)   ALPRAZolam (XANAX) 1 MG tablet TAKE 1 TABLET BY MOUTH EVERY MORNING, ONE TABLET AT NOON, ONE TABLET EVERY EVENING, AND ONE TABLET AT BEDTIME   atorvastatin (LIPITOR) 20 MG tablet TAKE 1 TABLET BY MOUTH AT BEDTIME   fluticasone (FLONASE) 50 MCG/ACT nasal spray Place 2 sprays into both nostrils daily.   furosemide (LASIX) 20 MG tablet TAKE 1 TABLET BY MOUTH EVERY DAY AS NEEDED   glucose blood (ACCU-CHEK GUIDE) test strip 1 each by Other route 3 (three) times daily as needed for other. as directed Dx E11.9   HYDROcodone-acetaminophen (NORCO) 10-325 MG tablet TAKE 1 TABLET BY MOUTH EVERY 8 HOURS AS NEEDED   levothyroxine (SYNTHROID) 100 MCG tablet TAKE 1 TABLET BY MOUTH EVERY DAY BEFORE BREAKFAST   loperamide (IMODIUM A-D) 2 MG tablet Take 1 tablet (2 mg total) by mouth in the morning and at bedtime.   loratadine (CLARITIN) 10 MG tablet Take 10 mg by mouth daily.   omeprazole (PRILOSEC) 40 MG capsule TAKE ONE CAPSULE BY MOUTH DAILY BEFORE BREAKFAST and TAKE ONE CAPSULE DAILY BEFORE DINNER   ondansetron (ZOFRAN) 4 MG tablet Take 1 tablet (4 mg total) by mouth as  needed for nausea.   OXYGEN Inhale 2 L into the lungs at bedtime.    potassium chloride (KLOR-CON) 10 MEQ tablet TAKE 1 TABLET BY MOUTH AT BEDTIME   SYMBICORT 160-4.5 MCG/ACT inhaler USE 2 INHALATIONS BY MOUTH  TWICE DAILY   Tiotropium Bromide Monohydrate (SPIRIVA RESPIMAT) 2.5 MCG/ACT AERS Inhale 2 puffs into the lungs daily.   triamterene-hydrochlorothiazide (MAXZIDE-25) 37.5-25 MG tablet TAKE 1 TABLET BY MOUTH EVERY DAY   venlafaxine XR (EFFEXOR XR) 75 MG 24 hr capsule 225 mg daily. Take along with 150 mg tab   venlafaxine XR (EFFEXOR-XR) 150 MG 24 hr capsule 225 mg daily. Take along with 75 mg tab   No facility-administered encounter medications on file as of 01/03/2020.    Current Diagnosis: Patient Active Problem List   Diagnosis Date Noted   Diabetes mellitus, new onset ([place]) 04/12/2019   Tremor 11/05/2018   Chronic respiratory failure with hypoxia ([place]) 01/12/2018   GERD with esophagitis 11/26/2017   Dysphagia 08/20/2017   Chronic pain syndrome 08/04/2017   Comorbid sleep-related hypoventilation 08/04/2017   OSA (obstructive sleep apnea) 08/04/2017   Hepatic steatosis 08/04/2017   Polyp of ascending colon    Diverticulosis of colon without hemorrhage    Gastritis and gastroduodenitis    Hypothyroidism 07/22/2017   Anxiety and depression 07/22/2017   Iron deficiency anemia 07/22/2017   Pilar cyst 07/22/2017   Morbid obesity due  to excess calories ([place]) 03/19/2017   Anemia 03/19/2017   COPD GOLD II still smoking  03/18/2017   Degenerative lumbar spinal stenosis 02/17/2017   S/P right hip revision 09/02/2016   S/P hip replacement 09/02/2016   Instability of prosthetic hip ([place]) 03/01/2016   Dislocation of hip prosthesis ([place]) 02/17/2016   Abnormal nuclear stress test: Intermediate Risk 10/31/2015   Atypical angina ([place]) 10/31/2015   Trigger finger, acquired 08/20/2012   Cigarette smoker 12/12/2008   Hyperlipidemia 11/17/2008    HYPERTENSION, BENIGN 11/16/2008   GEN OSTEOARTHROSIS INVOLVING MULTIPLE SITES 11/16/2008   Recent Relevant Labs: Lab Results  Component Value Date/Time   HGBA1C 6.6 (H) 11/30/2019 04:09 PM   HGBA1C 7.0 (H) 08/12/2019 11:58 AM   MICROALBUR 0.4 03/12/2019 01:54 PM    Kidney Function Lab Results  Component Value Date/Time   CREATININE 0.96 11/30/2019 04:09 PM   CREATININE 0.89 08/12/2019 11:58 AM   CREATININE 1.19 (H) 03/12/2019 01:54 PM   CREATININE 1.0 06/30/2014 02:48 PM   CREATININE 1.1 12/29/2013 01:05 PM   GFR 62.82 11/30/2019 04:09 PM   GFRNONAA 59 (L) 07/07/2019 02:04 PM   GFRAA >60 07/07/2019 02:04 PM     Current antihyperglycemic regimen:  -Atorvastatin (LIPITOR) 20 MG tablet  What recent interventions/DTPs have been made to improve glycemic control:  o 11-30-19 OV Brunetta Jeans, [place])  Patient was seen in office for follow up regarding diarrhea. Patients metformin was decreased to 1/2 dose with improvement in the symptoms.Patient was instructed to stop metformin, keep a low carb diet and continue to check glucose each morning. Patient was instructed if blood sugars are less than 125 she is good to go however if climbing further, patient instructed to inform provider so that they may consider starting a different medication.  Have there been any recent hospitalizations or ED visits since last visit with CPP? Yes,      11-19-19 ED visit- Patient was seen for abdominal pain and diarrhea complaint. Covid was obtained and was negative. Patient also had a flu test done and was also negative.  Patient {reports/denies:24182} hypoglycemic symptoms, including {Hypoglycemic Symptoms:3049003}  Patient {reports/denies:24182} hyperglycemic symptoms, including {symptoms; hyperglycemia:17903}  How often are you checking your blood sugar? {BG Testing frequency:23922}  What are your blood sugars ranging?  o Fasting: *** o Before meals: *** o After meals: *** o Bedtime:  ***  During the week, how often does your blood glucose drop below 70? {LowBGfrequency:24142}  Are you checking your feet daily/regularly?   Adherence Review: Is the patient currently on a STATIN medication? Yes Is the patient currently on ACE/ARB medication? No Does the patient have >5 day gap between last estimated fill dates? CPP to review   [place] ,[place] Pharmacist Assistant [phone]  Follow-Up:  Pharmacist Review

## 2020-01-05 ENCOUNTER — Ambulatory Visit (HOSPITAL_COMMUNITY): Payer: Medicare Other | Admitting: Psychiatry

## 2020-01-05 ENCOUNTER — Other Ambulatory Visit: Payer: Self-pay | Admitting: Adult Health

## 2020-01-10 ENCOUNTER — Other Ambulatory Visit: Payer: Self-pay

## 2020-01-11 ENCOUNTER — Other Ambulatory Visit: Payer: Self-pay | Admitting: Emergency Medicine

## 2020-01-11 ENCOUNTER — Other Ambulatory Visit: Payer: Self-pay | Admitting: Internal Medicine

## 2020-01-11 ENCOUNTER — Ambulatory Visit (INDEPENDENT_AMBULATORY_CARE_PROVIDER_SITE_OTHER): Payer: Medicare Other | Admitting: Physician Assistant

## 2020-01-11 ENCOUNTER — Encounter: Payer: Self-pay | Admitting: Physician Assistant

## 2020-01-11 VITALS — BP 110/78 | HR 73 | Temp 98.3°F | Resp 14 | Ht 62.5 in | Wt 215.0 lb

## 2020-01-11 DIAGNOSIS — F419 Anxiety disorder, unspecified: Secondary | ICD-10-CM | POA: Diagnosis not present

## 2020-01-11 DIAGNOSIS — F32A Depression, unspecified: Secondary | ICD-10-CM

## 2020-01-11 MED ORDER — BUSPIRONE HCL 7.5 MG PO TABS: 7.5000 mg | ORAL_TABLET | Freq: Two times a day (BID) | ORAL | 1 refills | Status: DC

## 2020-01-11 NOTE — Progress Notes (Signed)
Patient presents to clinic today c/o worsening anxiety. Patient with history of major depressive disorder and GAD, currently followed by psychiatry. Patient is on a regimen of venlafaxine prescribed at 225 mg daily but only taking 150 mg daily. Patient states she has not had another follow-up with her psychiatrist as she feels they're not helping her. Also states she cannot afford to see multiple specialist. Does note counseling was recommended to her by her specialist but she is not proceeding with this as she has tried in the past and it was not beneficial. Patient notes worsening of mood secondary to many home situational stressors. Her grandson is currently living with her and her husband and has been having unruly behavior including stealing from them and having strangers in the home. Patient states her grandson has nowhere else to go and so she does not know what to do. Patient denies any suicidal thought or ideation.  Past Medical History:  Diagnosis Date  . Allergic rhinitis   . Anemia   . Anxiety   . Chicken pox   . Chronic back pain   . COPD (chronic obstructive pulmonary disease) ([place])   . Depression   . DM (diabetes mellitus) ([place])   . Essential hypertension   . GERD (gastroesophageal reflux disease)   . Headache    migraines  . History of gastritis    EGD 2015  . History of home oxygen therapy    2 liters at hs last 6 months  . Hyperlipidemia   . Hypothyroidism   . Migraines   . Osteoarthritis    oa  . Scoliosis     Current Outpatient Medications on File Prior to Visit  Medication Sig Dispense Refill  . albuterol (PROAIR HFA) 108 (90 Base) MCG/ACT inhaler 2 puffs every 4 hours as needed only  if your can't catch your breath (Patient taking differently: Inhale 2 puffs into the lungs every 4 (four) hours as needed for wheezing or shortness of breath. if your can't catch your breath) 6.7 g 0  . ALPRAZolam (XANAX) 1 MG tablet TAKE 1 TABLET BY MOUTH EVERY MORNING, ONE  TABLET AT NOON, ONE TABLET EVERY EVENING, AND ONE TABLET AT BEDTIME 120 tablet 0  . atorvastatin (LIPITOR) 20 MG tablet TAKE 1 TABLET BY MOUTH AT BEDTIME 90 tablet 1  . fluticasone (FLONASE) 50 MCG/ACT nasal spray Place 2 sprays into both nostrils daily. 16 g 6  . furosemide (LASIX) 20 MG tablet TAKE 1 TABLET BY MOUTH EVERY DAY AS NEEDED 90 tablet 0  . glucose blood (ACCU-CHEK GUIDE) test strip 1 each by Other route 3 (three) times daily as needed for other. as directed Dx E11.9 100 strip 11  . HYDROcodone-acetaminophen (NORCO) 10-325 MG tablet TAKE 1 TABLET BY MOUTH EVERY 8 HOURS AS NEEDED 90 tablet 0  . levothyroxine (SYNTHROID) 100 MCG tablet TAKE 1 TABLET BY MOUTH EVERY DAY BEFORE BREAKFAST 90 tablet 1  . loperamide (IMODIUM A-D) 2 MG tablet Take 1 tablet (2 mg total) by mouth in the morning and at bedtime. 60 tablet 3  . loratadine (CLARITIN) 10 MG tablet Take 10 mg by mouth daily.    Marland Kitchen omeprazole (PRILOSEC) 40 MG capsule TAKE ONE CAPSULE BY MOUTH DAILY BEFORE BREAKFAST and TAKE ONE CAPSULE DAILY BEFORE DINNER 60 capsule 2  . ondansetron (ZOFRAN) 4 MG tablet Take 1 tablet (4 mg total) by mouth as needed for nausea. 50 tablet 6  . OXYGEN Inhale 2 L into the lungs at bedtime.     Marland Kitchen  potassium chloride (KLOR-CON) 10 MEQ tablet TAKE 1 TABLET BY MOUTH AT BEDTIME 90 tablet 0  . Tiotropium Bromide Monohydrate (SPIRIVA RESPIMAT) 2.5 MCG/ACT AERS Inhale 2 puffs into the lungs daily. 12 g 0  . triamterene-hydrochlorothiazide (MAXZIDE-25) 37.5-25 MG tablet TAKE 1 TABLET BY MOUTH EVERY DAY 90 tablet 1  . venlafaxine XR (EFFEXOR XR) 75 MG 24 hr capsule 225 mg daily. Take along with 150 mg tab 30 capsule 1  . venlafaxine XR (EFFEXOR-XR) 150 MG 24 hr capsule 225 mg daily. Take along with 75 mg tab 30 capsule 1   No current facility-administered medications on file prior to visit.    Allergies  Allergen Reactions  . Nsaids Diarrhea  . Aleve [Naproxen Sodium] Other (See Comments)    Headache   .  Codeine Nausea Only and Other (See Comments)    GI upset  . Penicillins Nausea Only and Other (See Comments)    GI upset   . Sulfonamide Derivatives Hives    Family History  Problem Relation Age of Onset  . COPD Mother   . Heart disease Mother   . Lung disease Father        Asbestosis  . Heart attack Father   . Heart disease Father   . Cerebral aneurysm Brother   . Aneurysm Brother        Brain  . Epilepsy Son   . Alcohol abuse Son   . Arthritis Maternal Grandmother   . Heart disease Maternal Grandmother   . Asthma Maternal Grandfather   . Cancer Maternal Grandfather   . Arthritis Paternal Grandmother   . Heart disease Paternal Grandmother   . Stroke Paternal Grandmother   . Early death Paternal Grandfather   . Heart disease Paternal Grandfather     Social History   Socioeconomic History  . Marital status: Married    Spouse name: Not on file  . Number of children: Not on file  . Years of education: Not on file  . Highest education level: Not on file  Occupational History  . Not on file  Tobacco Use  . Smoking status: Current Every Day Smoker    Packs/day: 1.50    Years: 46.00    Pack years: 69.00    Types: Cigarettes  . Smokeless tobacco: Never Used  Vaping Use  . Vaping Use: Never used  Substance and Sexual Activity  . Alcohol use: No  . Drug use: No  . Sexual activity: Yes  Other Topics Concern  . Not on file  Social History Narrative   Right handed    Caffeine~ 2 cups per day    Lives at home with husband (strained relationship)   Primary caretaker for disabled brother who had aneurism   Daughter died Jun 28, 2018    Social Determinants of Health   Financial Resource Strain: Low Risk   . Difficulty of Paying Living Expenses: Not hard at all  Food Insecurity: No Food Insecurity  . Worried About Charity fundraiser in the Last Year: Never true  . Ran Out of Food in the Last Year: Never true  Transportation Needs: No Transportation Needs  . Lack of  Transportation (Medical): No  . Lack of Transportation (Non-Medical): No  Physical Activity: Inactive  . Days of Exercise per Week: 0 days  . Minutes of Exercise per Session: 0 min  Stress: Stress Concern Present  . Feeling of Stress : To some extent  Social Connections: Moderately Integrated  . Frequency of Communication with  Friends and Family: More than three times a week  . Frequency of Social Gatherings with Friends and Family: Once a week  . Attends Religious Services: 1 to 4 times per year  . Active Member of Clubs or Organizations: No  . Attends Archivist Meetings: Never  . Marital Status: Married    Review of Systems - See HPI.  All other ROS are negative.  Wt 215 lb (97.5 kg)   BMI 38.70 kg/m   Physical Exam Vitals reviewed.  Constitutional:      Appearance: Normal appearance.  HENT:     Head: Normocephalic and atraumatic.  Cardiovascular:     Rate and Rhythm: Normal rate and regular rhythm.     Pulses: Normal pulses.     Heart sounds: Normal heart sounds.  Musculoskeletal:     Cervical back: Neck supple.  Neurological:     Mental Status: She is alert. Mental status is at baseline.  Psychiatric:        Mood and Affect: Mood normal.     Recent Results (from the past 2160 hour(s))  TSH     Status: None   Collection Time: 11/04/19  1:42 PM  Result Value Ref Range   TSH 4.39 0.35 - 4.50 uIU/mL  Respiratory Panel by RT PCR (Flu A&B, Covid) - Nasopharyngeal Swab     Status: None   Collection Time: 11/19/19  5:20 PM   Specimen: Nasopharyngeal Swab  Result Value Ref Range   SARS Coronavirus 2 by RT PCR NEGATIVE NEGATIVE    Comment: (NOTE) SARS-CoV-2 target nucleic acids are NOT DETECTED.  The SARS-CoV-2 RNA is generally detectable in upper respiratoy specimens during the acute phase of infection. The lowest concentration of SARS-CoV-2 viral copies this assay can detect is 131 copies/mL. A negative result does not preclude SARS-Cov-2 infection and  should not be used as the sole basis for treatment or other patient management decisions. A negative result may occur with  improper specimen collection/handling, submission of specimen other than nasopharyngeal swab, presence of viral mutation(s) within the areas targeted by this assay, and inadequate number of viral copies (<131 copies/mL). A negative result must be combined with clinical observations, patient history, and epidemiological information. The expected result is Negative.  Fact Sheet for Patients:  PinkCheek.be  Fact Sheet for Healthcare Providers:  GravelBags.it  This test is no t yet approved or cleared by the [place] FDA and  has been authorized for detection and/or diagnosis of SARS-CoV-2 by FDA under an Emergency Use Authorization (EUA). This EUA will remain  in effect (meaning this test can be used) for the duration of the COVID-19 declaration under Section 564(b)(1) of the Act, 21 U.S.C. section 360bbb-3(b)(1), unless the authorization is terminated or revoked sooner.     Influenza A by PCR NEGATIVE NEGATIVE   Influenza B by PCR NEGATIVE NEGATIVE    Comment: (NOTE) The Xpert Xpress SARS-CoV-2/FLU/RSV assay is intended as an aid in  the diagnosis of influenza from Nasopharyngeal swab specimens and  should not be used as a sole basis for treatment. Nasal washings and  aspirates are unacceptable for Xpert Xpress SARS-CoV-2/FLU/RSV  testing.  Fact Sheet for Patients: PinkCheek.be  Fact Sheet for Healthcare Providers: GravelBags.it  This test is not yet approved or cleared by the [place] FDA and  has been authorized for detection and/or diagnosis of SARS-CoV-2 by  FDA under an Emergency Use Authorization (EUA). This EUA will remain  in effect (meaning this test can be  used) for the duration of the  Covid-19 declaration under Section  564(b)(1) of the Act, 21  U.S.C. section 360bbb-3(b)(1), unless the authorization is  terminated or revoked. Performed at [facility], [address], [place], [place] [address]   CBG monitoring, ED     Status: Abnormal   Collection Time: 11/19/19  5:24 PM  Result Value Ref Range   Glucose-Capillary 105 (H) 70 - 99 mg/dL    Comment: Glucose reference range applies only to samples taken after fasting for at least 8 hours.  Hemoglobin A1c     Status: Abnormal   Collection Time: 11/30/19  4:09 PM  Result Value Ref Range   Hgb A1c MFr Bld 6.6 (H) 4.6 - 6.5 %    Comment: Glycemic Control Guidelines for People with Diabetes:Non Diabetic:  <6%Goal of Therapy: <7%Additional Action Suggested:  >8%   Comp Met (CMET)     Status: Abnormal   Collection Time: 11/30/19  4:09 PM  Result Value Ref Range   Sodium 134 (L) 135 - 145 mEq/L   Potassium 3.9 3.5 - 5.1 mEq/L   Chloride 98 96 - 112 mEq/L   CO2 28 19 - 32 mEq/L   Glucose, Bld 76 70 - 99 mg/dL   BUN 12 6 - 23 mg/dL   Creatinine, Ser 0.96 0.40 - 1.20 mg/dL   Total Bilirubin 0.5 0.2 - 1.2 mg/dL   Alkaline Phosphatase 92 39 - 117 U/L   AST 23 0 - 37 U/L   ALT 21 0 - 35 U/L   Total Protein 6.9 6.0 - 8.3 g/dL   Albumin 3.9 3.5 - 5.2 g/dL   GFR 62.82 >60.00 mL/min    Comment: Calculated using the CKD-EPI Creatinine Equation (2021)   Calcium 9.0 8.4 - 10.5 mg/dL  Lipid panel     Status: Abnormal   Collection Time: 11/30/19  4:09 PM  Result Value Ref Range   Cholesterol 98 0 - 200 mg/dL    Comment: ATP III Classification       Desirable:  < 200 mg/dL               Borderline High:  200 - 239 mg/dL          High:  > = 240 mg/dL   Triglycerides 193.0 (H) 0.0 - 149.0 mg/dL    Comment: Normal:  <150 mg/dLBorderline High:  150 - 199 mg/dL   HDL 31.90 (L) >39.00 mg/dL   VLDL 38.6 0.0 - 40.0 mg/dL   LDL Cholesterol 27 0 - 99 mg/dL   Total CHOL/HDL Ratio 3     Comment:                Men          Women1/2 Average Risk     3.4          3.3Average  Risk          5.0          4.42X Average Risk          9.6          7.13X Average Risk          15.0          11.0                       NonHDL 65.77     Comment: NOTE:  Non-HDL goal should be 30 mg/dL higher than patient's LDL goal (i.e.  LDL goal of < 70 mg/dL, would have non-HDL goal of < 100 mg/dL)   Assessment/Plan: 1. Anxiety and depression Patient again declines counseling. Declines following up with her specialist. We'll leave Effexor at current dose. We'll add on BuSpar 7.5 mg twice daily. Continue Klonopin as needed. Close follow-up scheduled.  This visit occurred during the SARS-CoV-2 public health emergency.  Safety protocols were in place, including screening questions prior to the visit, additional usage of staff PPE, and extensive cleaning of exam room while observing appropriate contact time as indicated for disinfecting solutions.     [name], PA-C

## 2020-01-11 NOTE — Patient Instructions (Signed)
Please add-on the BuSpar twice daily as directed.  Continue Effexor at current dose.   Please reconsider counseling.  We can follow-up in 3-4 weeks via video visit.  Sooner if needed.   Hang in there!

## 2020-01-12 ENCOUNTER — Telehealth: Payer: Self-pay | Admitting: Physician Assistant

## 2020-01-12 NOTE — Telephone Encounter (Signed)
Patient would like a refill on her xanax - she said she knows it is early but she saw you the other day.  Patients son took a covid test and it was positive - Please advise - Patient has a dentist appointment this afternoon - can she go?

## 2020-01-13 NOTE — Telephone Encounter (Signed)
I will send in a refill that can be filled when due.

## 2020-01-13 NOTE — Telephone Encounter (Signed)
Known COVID exposure and symptoms -- she needs to go be tested and quarantine until results are in. Should call her dentist to inform them and they will want to reschedule her.

## 2020-01-13 NOTE — Telephone Encounter (Signed)
Pt called in stating that she is having a cough and congestion, it started a couple a couple of day ago. Her Grandson tested pos. For covid yesterday. She wanted to know what she needs to do and when she should be tested.   Pt would also like her xanax sent into the pharmacy.   Pt can be reached at the home #

## 2020-01-13 NOTE — Telephone Encounter (Signed)
Spoke with pt and se has agreed to go and be tested for covid. I instructed her per your order to stay home and quarantine until results are in. Pt also states that she needs a refill on her Xanax that will run out on the 21st of this month. Please advise

## 2020-01-14 MED ORDER — ALPRAZOLAM 1 MG PO TABS: ORAL_TABLET | ORAL | 0 refills | Status: DC

## 2020-01-14 NOTE — Telephone Encounter (Signed)
Medication just resent to pharmacy for her.

## 2020-01-14 NOTE — Telephone Encounter (Signed)
Patient called back and states that [facility] has not received anything and that she is due to pick her medication up tomorrow - 01/15/2020.  Please advise

## 2020-01-25 ENCOUNTER — Other Ambulatory Visit: Payer: Self-pay | Admitting: Physician Assistant

## 2020-01-25 DIAGNOSIS — R609 Edema, unspecified: Secondary | ICD-10-CM

## 2020-01-25 DIAGNOSIS — R600 Localized edema: Secondary | ICD-10-CM

## 2020-01-28 ENCOUNTER — Other Ambulatory Visit: Payer: Self-pay | Admitting: Family Medicine

## 2020-01-28 DIAGNOSIS — G894 Chronic pain syndrome: Secondary | ICD-10-CM

## 2020-01-31 NOTE — Telephone Encounter (Signed)
LFD 12/31/19 #90 with no refills LOV 01/11/20 NOV none

## 2020-02-02 ENCOUNTER — Encounter: Payer: Self-pay | Admitting: Physician Assistant

## 2020-02-02 ENCOUNTER — Other Ambulatory Visit: Payer: Self-pay

## 2020-02-02 MED ORDER — BENZONATATE 100 MG PO CAPS: 100.0000 mg | ORAL_CAPSULE | Freq: Three times a day (TID) | ORAL | 0 refills | Status: DC | PRN

## 2020-02-02 NOTE — Telephone Encounter (Signed)
Okay to give Rx for benzonatate 100 mg up to 3 times daily as needed for coughing episodes.  Okay to send in 64 with 0 refills.

## 2020-02-09 ENCOUNTER — Telehealth: Payer: Self-pay

## 2020-02-09 ENCOUNTER — Other Ambulatory Visit: Payer: Self-pay | Admitting: Physician Assistant

## 2020-02-09 NOTE — Progress Notes (Unsigned)
Chronic Care Management Pharmacy Assistant   Name: [name]  MRN: [id number] DOB: Jun 14, 1955  Reason for Encounter: Disease State  PCP : Brunetta Jeans, PA-C  Allergies:   Allergies  Allergen Reactions  . Nsaids Diarrhea  . Aleve [Naproxen Sodium] Other (See Comments)    Headache   . Codeine Nausea Only and Other (See Comments)    GI upset  . Penicillins Nausea Only and Other (See Comments)    GI upset   . Sulfonamide Derivatives Hives    Medications: Outpatient Encounter Medications as of 02/09/2020  Medication Sig  . Accu-Chek Softclix Lancets lancets 4 (four) times daily.  Marland Kitchen albuterol (PROAIR HFA) 108 (90 Base) MCG/ACT inhaler 2 puffs every 4 hours as needed only  if your can't catch your breath (Patient taking differently: Inhale 2 puffs into the lungs every 4 (four) hours as needed for wheezing or shortness of breath. if your can't catch your breath)  . ALPRAZolam (XANAX) 1 MG tablet TAKE 1 TABLET BY MOUTH EVERY MORNING, ONE TABLET AT NOON, ONE TABLET EVERY EVENING, AND ONE TABLET AT BEDTIME  . atorvastatin (LIPITOR) 20 MG tablet TAKE 1 TABLET BY MOUTH AT BEDTIME  . benzonatate (TESSALON) 100 MG capsule Take 1 capsule (100 mg total) by mouth 3 (three) times daily as needed for cough.  . busPIRone (BUSPAR) 7.5 MG tablet Take 1 tablet (7.5 mg total) by mouth 2 (two) times daily.  . fluticasone (FLONASE) 50 MCG/ACT nasal spray Place 2 sprays into both nostrils daily.  . furosemide (LASIX) 20 MG tablet TAKE 1 TABLET BY MOUTH EVERY DAY AS NEEDED  . glucose blood (ACCU-CHEK GUIDE) test strip 1 each by Other route 3 (three) times daily as needed for other. as directed Dx E11.9  . HYDROcodone-acetaminophen (NORCO) 10-325 MG tablet TAKE 1 TABLET BY MOUTH EVERY 8 HOURS AS NEEDED  . levothyroxine (SYNTHROID) 100 MCG tablet TAKE 1 TABLET BY MOUTH EVERY DAY BEFORE BREAKFAST  . loperamide (IMODIUM A-D) 2 MG tablet Take 1 tablet (2 mg total) by mouth in the morning and at  bedtime.  Marland Kitchen loratadine (CLARITIN) 10 MG tablet Take 10 mg by mouth daily.  Marland Kitchen omeprazole (PRILOSEC) 40 MG capsule TAKE ONE CAPSULE BY MOUTH DAILY BEFORE BREAKFAST and TAKE ONE CAPSULE DAILY BEFORE DINNER  . ondansetron (ZOFRAN) 4 MG tablet Take 1 tablet (4 mg total) by mouth as needed for nausea.  . OXYGEN Inhale 2 L into the lungs at bedtime.   . potassium chloride (KLOR-CON) 10 MEQ tablet TAKE 1 TABLET BY MOUTH AT BEDTIME  . SYMBICORT 160-4.5 MCG/ACT inhaler INHALE TWO PUFFS BY MOUTH TWICE DAILY  . Tiotropium Bromide Monohydrate (SPIRIVA RESPIMAT) 2.5 MCG/ACT AERS Inhale 2 puffs into the lungs daily.  Marland Kitchen triamterene-hydrochlorothiazide (MAXZIDE-25) 37.5-25 MG tablet TAKE 1 TABLET BY MOUTH EVERY DAY  . venlafaxine XR (EFFEXOR XR) 75 MG 24 hr capsule 225 mg daily. Take along with 150 mg tab  . venlafaxine XR (EFFEXOR-XR) 150 MG 24 hr capsule 225 mg daily. Take along with 75 mg tab   No facility-administered encounter medications on file as of 02/09/2020.    Current Diagnosis: Patient Active Problem List   Diagnosis Date Noted  . Diabetes mellitus, new onset ([place]) 04/12/2019  . Tremor 11/05/2018  . Chronic respiratory failure with hypoxia ([place]) 01/12/2018  . GERD with esophagitis 11/26/2017  . Dysphagia 08/20/2017  . Chronic pain syndrome 08/04/2017  . Comorbid sleep-related hypoventilation 08/04/2017  . OSA (obstructive sleep apnea) 08/04/2017  .  Hepatic steatosis 08/04/2017  . Polyp of ascending colon   . Diverticulosis of colon without hemorrhage   . Gastritis and gastroduodenitis   . Hypothyroidism 07/22/2017  . Anxiety and depression 07/22/2017  . Iron deficiency anemia 07/22/2017  . Pilar cyst 07/22/2017  . Morbid obesity due to excess calories ([place]) 03/19/2017  . Anemia 03/19/2017  . COPD GOLD II still smoking  03/18/2017  . Degenerative lumbar spinal stenosis 02/17/2017  . S/P right hip revision 09/02/2016  . S/P hip replacement 09/02/2016  . Instability of prosthetic hip  ([place]) 03/01/2016  . Dislocation of hip prosthesis ([place]) 02/17/2016  . Abnormal nuclear stress test: Intermediate Risk 10/31/2015  . Atypical angina ([place]) 10/31/2015  . Trigger finger, acquired 08/20/2012  . Cigarette smoker 12/12/2008  . Hyperlipidemia 11/17/2008  . HYPERTENSION, BENIGN 11/16/2008  . GEN OSTEOARTHROSIS INVOLVING MULTIPLE SITES 11/16/2008    Recent Relevant Labs: Lab Results  Component Value Date/Time   HGBA1C 6.6 (H) 11/30/2019 04:09 PM   HGBA1C 7.0 (H) 08/12/2019 11:58 AM   MICROALBUR 0.4 03/12/2019 01:54 PM    Kidney Function Lab Results  Component Value Date/Time   CREATININE 0.96 11/30/2019 04:09 PM   CREATININE 0.89 08/12/2019 11:58 AM   CREATININE 1.19 (H) 03/12/2019 01:54 PM   CREATININE 1.0 06/30/2014 02:48 PM   CREATININE 1.1 12/29/2013 01:05 PM   GFR 62.82 11/30/2019 04:09 PM   GFRNONAA 59 (L) 07/07/2019 02:04 PM   GFRAA >60 07/07/2019 02:04 PM    . Current antihyperglycemic regimen:  o None noted . What recent interventions/DTPs have been made to improve glycemic control:   11/30/19 OV with Raiford Noble, [place]) Patient metformin was held completely due to improvement in stools with decrease metformin. . Have there been any recent hospitalizations or ED visits since last visit with CPP? No . Patient {reports/denies:24182} hypoglycemic symptoms, including {Hypoglycemic Symptoms:3049003} . Patient {reports/denies:24182} hyperglycemic symptoms, including {symptoms; hyperglycemia:17903} . How often are you checking your blood sugar? {BG Testing frequency:23922} . What are your blood sugars ranging?  o Fasting: *** o Before meals: *** o After meals: *** o Bedtime: *** . During the week, how often does your blood glucose drop below 70? {LowBGfrequency:24142} . Are you checking your feet daily/regularly?   Adherence Review: Is the patient currently on a STATIN medication? Yes Is the patient currently on ACE/ARB medication? No Does the patient have  >5 day gap between last estimated fill dates? CPP to review  Georgiana Shore ,[place] Pharmacist Assistant [place]  Follow-Up:  Pharmacist Review

## 2020-02-10 NOTE — Telephone Encounter (Signed)
Requesting: Xanax 1mg  Contract: UDS: Last Visit:01/11/20 Next Visit:n/a Last Refill:01/14/20  Please Advise

## 2020-02-14 ENCOUNTER — Telehealth: Payer: Self-pay

## 2020-02-14 NOTE — Chronic Care Management (AMB) (Signed)
Chronic Care Management Pharmacy Assistant   Name: [name]  MRN: [id number] DOB: December 17, 1955  Reason for Encounter: Disease State/ Diabetes Adherence Call   PCP : Brunetta Jeans, PA-C  Allergies:   Allergies  Allergen Reactions  . Nsaids Diarrhea  . Aleve [Naproxen Sodium] Other (See Comments)    Headache   . Codeine Nausea Only and Other (See Comments)    GI upset  . Penicillins Nausea Only and Other (See Comments)    GI upset   . Sulfonamide Derivatives Hives    Medications: Outpatient Encounter Medications as of 02/14/2020  Medication Sig  . Accu-Chek Softclix Lancets lancets 4 (four) times daily.  Marland Kitchen albuterol (PROAIR HFA) 108 (90 Base) MCG/ACT inhaler 2 puffs every 4 hours as needed only  if your can't catch your breath (Patient taking differently: Inhale 2 puffs into the lungs every 4 (four) hours as needed for wheezing or shortness of breath. if your can't catch your breath)  . ALPRAZolam (XANAX) 1 MG tablet TAKE 1 TABLET BY MOUTH EVERY MORNING, TAKE 1 TABLET AT NOON, TAKE 1 TABLET EVERY EVENING, AND TAKE 1 TABLET AT BEDTIME  . atorvastatin (LIPITOR) 20 MG tablet TAKE 1 TABLET BY MOUTH AT BEDTIME  . benzonatate (TESSALON) 100 MG capsule Take 1 capsule (100 mg total) by mouth 3 (three) times daily as needed for cough.  . busPIRone (BUSPAR) 7.5 MG tablet Take 1 tablet (7.5 mg total) by mouth 2 (two) times daily.  . fluticasone (FLONASE) 50 MCG/ACT nasal spray Place 2 sprays into both nostrils daily.  . furosemide (LASIX) 20 MG tablet TAKE 1 TABLET BY MOUTH EVERY DAY AS NEEDED  . glucose blood (ACCU-CHEK GUIDE) test strip 1 each by Other route 3 (three) times daily as needed for other. as directed Dx E11.9  . HYDROcodone-acetaminophen (NORCO) 10-325 MG tablet TAKE 1 TABLET BY MOUTH EVERY 8 HOURS AS NEEDED  . levothyroxine (SYNTHROID) 100 MCG tablet TAKE 1 TABLET BY MOUTH EVERY DAY BEFORE BREAKFAST  . loperamide (IMODIUM A-D) 2 MG tablet Take 1 tablet (2 mg total)  by mouth in the morning and at bedtime.  Marland Kitchen loratadine (CLARITIN) 10 MG tablet Take 10 mg by mouth daily.  Marland Kitchen omeprazole (PRILOSEC) 40 MG capsule TAKE ONE CAPSULE BY MOUTH DAILY BEFORE BREAKFAST and TAKE ONE CAPSULE DAILY BEFORE DINNER  . ondansetron (ZOFRAN) 4 MG tablet Take 1 tablet (4 mg total) by mouth as needed for nausea.  . OXYGEN Inhale 2 L into the lungs at bedtime.   . potassium chloride (KLOR-CON) 10 MEQ tablet TAKE 1 TABLET BY MOUTH AT BEDTIME  . SYMBICORT 160-4.5 MCG/ACT inhaler INHALE TWO PUFFS BY MOUTH TWICE DAILY  . Tiotropium Bromide Monohydrate (SPIRIVA RESPIMAT) 2.5 MCG/ACT AERS Inhale 2 puffs into the lungs daily.  Marland Kitchen triamterene-hydrochlorothiazide (MAXZIDE-25) 37.5-25 MG tablet TAKE 1 TABLET BY MOUTH EVERY DAY  . venlafaxine XR (EFFEXOR XR) 75 MG 24 hr capsule 225 mg daily. Take along with 150 mg tab  . venlafaxine XR (EFFEXOR-XR) 150 MG 24 hr capsule 225 mg daily. Take along with 75 mg tab   No facility-administered encounter medications on file as of 02/14/2020.    Current Diagnosis: Patient Active Problem List   Diagnosis Date Noted  . Diabetes mellitus, new onset ([place]) 04/12/2019  . Tremor 11/05/2018  . Chronic respiratory failure with hypoxia ([place]) 01/12/2018  . GERD with esophagitis 11/26/2017  . Dysphagia 08/20/2017  . Chronic pain syndrome 08/04/2017  . Comorbid sleep-related hypoventilation 08/04/2017  .  OSA (obstructive sleep apnea) 08/04/2017  . Hepatic steatosis 08/04/2017  . Polyp of ascending colon   . Diverticulosis of colon without hemorrhage   . Gastritis and gastroduodenitis   . Hypothyroidism 07/22/2017  . Anxiety and depression 07/22/2017  . Iron deficiency anemia 07/22/2017  . Pilar cyst 07/22/2017  . Morbid obesity due to excess calories ([place]) 03/19/2017  . Anemia 03/19/2017  . COPD GOLD II still smoking  03/18/2017  . Degenerative lumbar spinal stenosis 02/17/2017  . S/P right hip revision 09/02/2016  . S/P hip replacement 09/02/2016  .  Instability of prosthetic hip ([place]) 03/01/2016  . Dislocation of hip prosthesis ([place]) 02/17/2016  . Abnormal nuclear stress test: Intermediate Risk 10/31/2015  . Atypical angina ([place]) 10/31/2015  . Trigger finger, acquired 08/20/2012  . Cigarette smoker 12/12/2008  . Hyperlipidemia 11/17/2008  . HYPERTENSION, BENIGN 11/16/2008  . GEN OSTEOARTHROSIS INVOLVING MULTIPLE SITES 11/16/2008    Recent Relevant Labs: Lab Results  Component Value Date/Time   HGBA1C 6.6 (H) 11/30/2019 04:09 PM   HGBA1C 7.0 (H) 08/12/2019 11:58 AM   MICROALBUR 0.4 03/12/2019 01:54 PM    Kidney Function Lab Results  Component Value Date/Time   CREATININE 0.96 11/30/2019 04:09 PM   CREATININE 0.89 08/12/2019 11:58 AM   CREATININE 1.19 (H) 03/12/2019 01:54 PM   CREATININE 1.0 06/30/2014 02:48 PM   CREATININE 1.1 12/29/2013 01:05 PM   GFR 62.82 11/30/2019 04:09 PM   GFRNONAA 59 (L) 07/07/2019 02:04 PM   GFRAA >60 07/07/2019 02:04 PM    . Current antihyperglycemic regimen:  o Patient not taking any antihyperglycemic medications at this time.  . What recent interventions/DTPs have been made to improve glycemic control:  o Patient states she has not had any recent interventions or DTPs. However, patient has been off of Metformin for some time and is not currently taking any antihyperglycemic medications.  . Have there been any recent hospitalizations or ED visits since last visit with CPP? No, patient has not had any recent hospitalizations or ED visits since her last visit with [name], Pharm.D., BCGP.  Marland Kitchen Patient reports hypoglycemic symptoms, including sweaty and shaky hands. Patient states for the last couple of months she has felt really sweaty and her hands will start shaking.  . Patient reports hyperglycemic symptoms, including blurry vision, fatigue, and weakness. Patient states when she first wakes up in the morning she has to sit up for a few minutes before getting out of bed in hopes of being able  to see. Patient states her vision is really blurry when she first wakes up in the mornings. She reports blurry and double vision. Patient states in the evening her eyes will start burning really bad. Patient states her eyes will burn so bad it brings tears to her eyes. Patient states she has an appointment with her ophthalmologist ([facility] in [place], [place]) on 03/21/2020 for a diabetic eye exam. Patient states she would like for me to call [facility] In [place] to request a sooner date than 03/21/2020. They are closed today 02/14/2020 due to inclement weather, so I will reach out to them in the morning as long as they are open.  . How often are you checking your blood sugar? Once daily before breakfast.  . What are your blood sugars ranging?  o Fasting: 80 in the morning before breakfast o Before meals: n/a o After meals: n/a o Bedtime: n/a  . During the week, how often does your blood glucose drop below  70? about 3 times a month.  . Are you checking your feet daily/regularly?   Patient states she checks her feet daily and states she has no concerns with her feet at this time.  Adherence Review: Is the patient currently on a STATIN medication? Yes Is the patient currently on ACE/ARB medication? No Does the patient have >5 day gap between last estimated fill dates? No   Patient states she lost her patient assistance applications for Spiriva and Symbicort and is requesting new ones. Patient also states she has been using a sample of Incruse Ellipta for her COPD. Patient states she likes this medication better than Spiriva as Spiriva gives her a "coughing fit".   Patient reschedule her telephone appointment with [name], Pharm.D., BCGP for Tuesday 02/29/2020 at 2:00 pm.  April D Calhoun, [place] Pharmacist Assistant [phone]  Follow-Up:  Pharmacist Review

## 2020-02-15 ENCOUNTER — Telehealth: Payer: Self-pay

## 2020-02-15 NOTE — Chronic Care Management (AMB) (Signed)
Chronic Care Management Pharmacy Assistant   Name: [name]  MRN: [id number] DOB: October 06, 1955  Reason for Encounter: Patient Call/Patient Assistance Application   PCP : Brunetta Jeans, PA-C  Allergies:   Allergies  Allergen Reactions  . Nsaids Diarrhea  . Aleve [Naproxen Sodium] Other (See Comments)    Headache   . Codeine Nausea Only and Other (See Comments)    GI upset  . Penicillins Nausea Only and Other (See Comments)    GI upset   . Sulfonamide Derivatives Hives    Medications: Outpatient Encounter Medications as of 02/15/2020  Medication Sig  . Accu-Chek Softclix Lancets lancets 4 (four) times daily.  Marland Kitchen albuterol (PROAIR HFA) 108 (90 Base) MCG/ACT inhaler 2 puffs every 4 hours as needed only  if your can't catch your breath (Patient taking differently: Inhale 2 puffs into the lungs every 4 (four) hours as needed for wheezing or shortness of breath. if your can't catch your breath)  . ALPRAZolam (XANAX) 1 MG tablet TAKE 1 TABLET BY MOUTH EVERY MORNING, TAKE 1 TABLET AT NOON, TAKE 1 TABLET EVERY EVENING, AND TAKE 1 TABLET AT BEDTIME  . atorvastatin (LIPITOR) 20 MG tablet TAKE 1 TABLET BY MOUTH AT BEDTIME  . benzonatate (TESSALON) 100 MG capsule Take 1 capsule (100 mg total) by mouth 3 (three) times daily as needed for cough.  . busPIRone (BUSPAR) 7.5 MG tablet Take 1 tablet (7.5 mg total) by mouth 2 (two) times daily.  . fluticasone (FLONASE) 50 MCG/ACT nasal spray Place 2 sprays into both nostrils daily.  . furosemide (LASIX) 20 MG tablet TAKE 1 TABLET BY MOUTH EVERY DAY AS NEEDED  . glucose blood (ACCU-CHEK GUIDE) test strip 1 each by Other route 3 (three) times daily as needed for other. as directed Dx E11.9  . HYDROcodone-acetaminophen (NORCO) 10-325 MG tablet TAKE 1 TABLET BY MOUTH EVERY 8 HOURS AS NEEDED  . levothyroxine (SYNTHROID) 100 MCG tablet TAKE 1 TABLET BY MOUTH EVERY DAY BEFORE BREAKFAST  . loperamide (IMODIUM A-D) 2 MG tablet Take 1 tablet (2 mg  total) by mouth in the morning and at bedtime.  Marland Kitchen loratadine (CLARITIN) 10 MG tablet Take 10 mg by mouth daily.  Marland Kitchen omeprazole (PRILOSEC) 40 MG capsule TAKE ONE CAPSULE BY MOUTH DAILY BEFORE BREAKFAST and TAKE ONE CAPSULE DAILY BEFORE DINNER  . ondansetron (ZOFRAN) 4 MG tablet Take 1 tablet (4 mg total) by mouth as needed for nausea.  . OXYGEN Inhale 2 L into the lungs at bedtime.   . potassium chloride (KLOR-CON) 10 MEQ tablet TAKE 1 TABLET BY MOUTH AT BEDTIME  . SYMBICORT 160-4.5 MCG/ACT inhaler INHALE TWO PUFFS BY MOUTH TWICE DAILY  . Tiotropium Bromide Monohydrate (SPIRIVA RESPIMAT) 2.5 MCG/ACT AERS Inhale 2 puffs into the lungs daily.  Marland Kitchen triamterene-hydrochlorothiazide (MAXZIDE-25) 37.5-25 MG tablet TAKE 1 TABLET BY MOUTH EVERY DAY  . venlafaxine XR (EFFEXOR XR) 75 MG 24 hr capsule 225 mg daily. Take along with 150 mg tab  . venlafaxine XR (EFFEXOR-XR) 150 MG 24 hr capsule 225 mg daily. Take along with 75 mg tab   No facility-administered encounter medications on file as of 02/15/2020.    Current Diagnosis: Patient Active Problem List   Diagnosis Date Noted  . Diabetes mellitus, new onset ([place]) 04/12/2019  . Tremor 11/05/2018  . Chronic respiratory failure with hypoxia ([place]) 01/12/2018  . GERD with esophagitis 11/26/2017  . Dysphagia 08/20/2017  . Chronic pain syndrome 08/04/2017  . Comorbid sleep-related hypoventilation 08/04/2017  .  OSA (obstructive sleep apnea) 08/04/2017  . Hepatic steatosis 08/04/2017  . Polyp of ascending colon   . Diverticulosis of colon without hemorrhage   . Gastritis and gastroduodenitis   . Hypothyroidism 07/22/2017  . Anxiety and depression 07/22/2017  . Iron deficiency anemia 07/22/2017  . Pilar cyst 07/22/2017  . Morbid obesity due to excess calories ([place]) 03/19/2017  . Anemia 03/19/2017  . COPD GOLD II still smoking  03/18/2017  . Degenerative lumbar spinal stenosis 02/17/2017  . S/P right hip revision 09/02/2016  . S/P hip replacement  09/02/2016  . Instability of prosthetic hip ([place]) 03/01/2016  . Dislocation of hip prosthesis ([place]) 02/17/2016  . Abnormal nuclear stress test: Intermediate Risk 10/31/2015  . Atypical angina ([place]) 10/31/2015  . Trigger finger, acquired 08/20/2012  . Cigarette smoker 12/12/2008  . Hyperlipidemia 11/17/2008  . HYPERTENSION, BENIGN 11/16/2008  . GEN OSTEOARTHROSIS INVOLVING MULTIPLE SITES 11/16/2008    I called patient's ophthalmologist today ([facility] in [place], [place]) to see if I can get the patient a sooner appointment than her scheduled appointment on 03/21/2020 for her diabetic eye exam. Patient has complained of blurry and double vision along with a burning sensation at times. I reported these symptoms with [facility] while requesting a sooner appointment. I was told this is the first available appointment for the patient. Patient will be placed on a waiting list if anything becomes available.  Patient assistance applications completed for both Spiriva and Symbicort. Patient aware of both appointment information and completion of patient assistance applications. Patient is requesting for these applications to be mailed to her.  April D Calhoun, [place] Pharmacist Assistant [phone]   Follow-Up:  Pharmacist Review

## 2020-02-17 ENCOUNTER — Telehealth: Payer: Self-pay | Admitting: Emergency Medicine

## 2020-02-17 DIAGNOSIS — E119 Type 2 diabetes mellitus without complications: Secondary | ICD-10-CM

## 2020-02-17 NOTE — Telephone Encounter (Signed)
Spoke with patient and she was told to call back to schedule an lab appointment for her Diabetes. She is currently off the Metformin. Not sure how soon to recheck A1C. Please advise and I will call patient back to schedule appointment with you or lab appointment. Patient will have to do it by phone. Patient is having a sore throat.

## 2020-02-18 NOTE — Telephone Encounter (Signed)
Not due for A1C until February. See prior message from PharmD that she was having some symptoms. Recommend if those are still present we start with a video visit and get her in for a labs one out of quarantine.

## 2020-02-18 NOTE — Telephone Encounter (Signed)
Patient states she does not have a sore throat, took home covid test and was negative. Scheduled for lab appt on 03/01/20 for a1c. Orders placed.

## 2020-02-18 NOTE — Addendum Note (Signed)
Addended by: Leonidas Romberg on: 02/18/2020 04:53 PM   Modules accepted: Orders

## 2020-02-24 ENCOUNTER — Telehealth: Payer: Self-pay | Admitting: Physician Assistant

## 2020-02-24 DIAGNOSIS — G894 Chronic pain syndrome: Secondary | ICD-10-CM

## 2020-02-24 NOTE — Telephone Encounter (Signed)
Pt needs scripts sent in of the alprazolam and the hydrocodone by 03/02/20 she uses [facility]    Please advise   To Einar Pheasant:  Pt wanted to Thank you for everything you have done, she said that she hates to lose you but is happy that you are going to get to spend more time with your family.   She said she loves you and will miss you.

## 2020-02-25 MED ORDER — HYDROCODONE-ACETAMINOPHEN 10-325 MG PO TABS: 1.0000 | ORAL_TABLET | Freq: Three times a day (TID) | ORAL | 0 refills | Status: DC | PRN

## 2020-02-25 NOTE — Telephone Encounter (Signed)
Xanax last rx 02/10/20 #120 Hydrocodone last rx 01/31/20 #90 LOV: 01/11/20 Anxiety and Depression CSC:08/21/17

## 2020-02-25 NOTE — Telephone Encounter (Signed)
Refill for hydrocodone sent.  Too soon for alprazolam refill.

## 2020-02-28 ENCOUNTER — Other Ambulatory Visit: Payer: Self-pay | Admitting: Physician Assistant

## 2020-02-29 DIAGNOSIS — H2513 Age-related nuclear cataract, bilateral: Secondary | ICD-10-CM | POA: Diagnosis not present

## 2020-02-29 DIAGNOSIS — Z0100 Encounter for examination of eyes and vision without abnormal findings: Secondary | ICD-10-CM | POA: Diagnosis not present

## 2020-02-29 DIAGNOSIS — D3132 Benign neoplasm of left choroid: Secondary | ICD-10-CM | POA: Diagnosis not present

## 2020-02-29 DIAGNOSIS — H5210 Myopia, unspecified eye: Secondary | ICD-10-CM | POA: Diagnosis not present

## 2020-02-29 DIAGNOSIS — E119 Type 2 diabetes mellitus without complications: Secondary | ICD-10-CM | POA: Diagnosis not present

## 2020-03-01 ENCOUNTER — Other Ambulatory Visit (INDEPENDENT_AMBULATORY_CARE_PROVIDER_SITE_OTHER): Payer: Medicare HMO

## 2020-03-01 ENCOUNTER — Other Ambulatory Visit: Payer: Self-pay

## 2020-03-01 DIAGNOSIS — E119 Type 2 diabetes mellitus without complications: Secondary | ICD-10-CM

## 2020-03-01 DIAGNOSIS — R195 Other fecal abnormalities: Secondary | ICD-10-CM | POA: Diagnosis not present

## 2020-03-01 DIAGNOSIS — J449 Chronic obstructive pulmonary disease, unspecified: Secondary | ICD-10-CM | POA: Diagnosis not present

## 2020-03-01 LAB — CBC WITH DIFFERENTIAL/PLATELET
Basophils Absolute: 0.1 10*3/uL (ref 0.0–0.1)
Basophils Relative: 0.5 % (ref 0.0–3.0)
Eosinophils Absolute: 0.3 10*3/uL (ref 0.0–0.7)
Eosinophils Relative: 2.8 % (ref 0.0–5.0)
HCT: 40.4 % (ref 36.0–46.0)
Hemoglobin: 13.4 g/dL (ref 12.0–15.0)
Lymphocytes Relative: 30.6 % (ref 12.0–46.0)
Lymphs Abs: 3 10*3/uL (ref 0.7–4.0)
MCHC: 33.2 g/dL (ref 30.0–36.0)
MCV: 84.6 fl (ref 78.0–100.0)
Monocytes Absolute: 0.8 10*3/uL (ref 0.1–1.0)
Monocytes Relative: 7.8 % (ref 3.0–12.0)
Neutro Abs: 5.8 10*3/uL (ref 1.4–7.7)
Neutrophils Relative %: 58.3 % (ref 43.0–77.0)
Platelets: 190 10*3/uL (ref 150.0–400.0)
RBC: 4.78 Mil/uL (ref 3.87–5.11)
RDW: 15.2 % (ref 11.5–15.5)
WBC: 9.9 10*3/uL (ref 4.0–10.5)

## 2020-03-01 LAB — HEMOGLOBIN A1C: Hgb A1c MFr Bld: 6.5 % (ref 4.6–6.5)

## 2020-03-02 ENCOUNTER — Telehealth: Payer: Medicare Other

## 2020-03-10 ENCOUNTER — Other Ambulatory Visit: Payer: Self-pay | Admitting: Physician Assistant

## 2020-03-10 NOTE — Telephone Encounter (Signed)
Xanax last rx 02/10/20 #120 LOV: 01/11/20 Anxiety/Depression CSC: 08/21/17

## 2020-03-17 ENCOUNTER — Telehealth: Payer: Self-pay

## 2020-03-17 NOTE — Chronic Care Management (AMB) (Signed)
Chronic Care Management Pharmacy Assistant   Name: [name]  MRN: [id number] DOB: October 10, 1955  Reason for Encounter: Patient Assistance Documentation  PCP : Brunetta Jeans, PA-C  Allergies:   Allergies  Allergen Reactions  . Nsaids Diarrhea  . Aleve [Naproxen Sodium] Other (See Comments)    Headache   . Codeine Nausea Only and Other (See Comments)    GI upset  . Penicillins Nausea Only and Other (See Comments)    GI upset   . Sulfonamide Derivatives Hives    Medications: Outpatient Encounter Medications as of 03/17/2020  Medication Sig  . Accu-Chek Softclix Lancets lancets 4 (four) times daily.  Marland Kitchen albuterol (PROAIR HFA) 108 (90 Base) MCG/ACT inhaler 2 puffs every 4 hours as needed only  if your can't catch your breath (Patient taking differently: Inhale 2 puffs into the lungs every 4 (four) hours as needed for wheezing or shortness of breath. if your can't catch your breath)  . ALPRAZolam (XANAX) 1 MG tablet TAKE 1 TABLET BY MOUTH EVERY MORNING, TAKE 1 TABLET AT NOON, TAKE 1 TABLET EVERY EVENING, AND TAKE 1 TABLET AT BEDTIME  . atorvastatin (LIPITOR) 20 MG tablet TAKE 1 TABLET BY MOUTH AT BEDTIME  . benzonatate (TESSALON) 100 MG capsule Take 1 capsule (100 mg total) by mouth 3 (three) times daily as needed for cough.  . busPIRone (BUSPAR) 7.5 MG tablet TAKE 1 TABLET BY MOUTH TWICE DAILY  . fluticasone (FLONASE) 50 MCG/ACT nasal spray Place 2 sprays into both nostrils daily.  . furosemide (LASIX) 20 MG tablet TAKE 1 TABLET BY MOUTH EVERY DAY AS NEEDED  . glucose blood (ACCU-CHEK GUIDE) test strip 1 each by Other route 3 (three) times daily as needed for other. as directed Dx E11.9  . HYDROcodone-acetaminophen (NORCO) 10-325 MG tablet Take 1 tablet by mouth every 8 (eight) hours as needed.  Marland Kitchen levothyroxine (SYNTHROID) 100 MCG tablet TAKE 1 TABLET BY MOUTH EVERY DAY BEFORE BREAKFAST  . loperamide (IMODIUM A-D) 2 MG tablet Take 1 tablet (2 mg total) by mouth in the  morning and at bedtime.  Marland Kitchen loratadine (CLARITIN) 10 MG tablet Take 10 mg by mouth daily.  Marland Kitchen omeprazole (PRILOSEC) 40 MG capsule TAKE ONE CAPSULE BY MOUTH DAILY BEFORE BREAKFAST and TAKE ONE CAPSULE DAILY BEFORE DINNER  . ondansetron (ZOFRAN) 4 MG tablet Take 1 tablet (4 mg total) by mouth as needed for nausea.  . OXYGEN Inhale 2 L into the lungs at bedtime.   . potassium chloride (KLOR-CON) 10 MEQ tablet TAKE 1 TABLET BY MOUTH AT BEDTIME  . SYMBICORT 160-4.5 MCG/ACT inhaler INHALE TWO PUFFS BY MOUTH TWICE DAILY  . Tiotropium Bromide Monohydrate (SPIRIVA RESPIMAT) 2.5 MCG/ACT AERS Inhale 2 puffs into the lungs daily.  Marland Kitchen triamterene-hydrochlorothiazide (MAXZIDE-25) 37.5-25 MG tablet TAKE 1 TABLET BY MOUTH EVERY DAY  . venlafaxine XR (EFFEXOR XR) 75 MG 24 hr capsule 225 mg daily. Take along with 150 mg tab  . venlafaxine XR (EFFEXOR-XR) 150 MG 24 hr capsule 225 mg daily. Take along with 75 mg tab   No facility-administered encounter medications on file as of 03/17/2020.    Current Diagnosis: Patient Active Problem List   Diagnosis Date Noted  . Diabetes mellitus, new onset ([place]) 04/12/2019  . Tremor 11/05/2018  . Chronic respiratory failure with hypoxia ([place]) 01/12/2018  . GERD with esophagitis 11/26/2017  . Dysphagia 08/20/2017  . Chronic pain syndrome 08/04/2017  . Comorbid sleep-related hypoventilation 08/04/2017  . OSA (obstructive sleep apnea) 08/04/2017  .  Hepatic steatosis 08/04/2017  . Polyp of ascending colon   . Diverticulosis of colon without hemorrhage   . Gastritis and gastroduodenitis   . Hypothyroidism 07/22/2017  . Anxiety and depression 07/22/2017  . Iron deficiency anemia 07/22/2017  . Pilar cyst 07/22/2017  . Morbid obesity due to excess calories ([place]) 03/19/2017  . Anemia 03/19/2017  . COPD GOLD II still smoking  03/18/2017  . Degenerative lumbar spinal stenosis 02/17/2017  . S/P right hip revision 09/02/2016  . S/P hip replacement 09/02/2016  . Instability of  prosthetic hip ([place]) 03/01/2016  . Dislocation of hip prosthesis ([place]) 02/17/2016  . Abnormal nuclear stress test: Intermediate Risk 10/31/2015  . Atypical angina ([place]) 10/31/2015  . Trigger finger, acquired 08/20/2012  . Cigarette smoker 12/12/2008  . Hyperlipidemia 11/17/2008  . HYPERTENSION, BENIGN 11/16/2008  . GEN OSTEOARTHROSIS INVOLVING MULTIPLE SITES 11/16/2008    I called patient assistance program for Spriva and Symbicort to inquire application status. Applications have not been received.  Patient states she lost her patient assistance applications for medications Spiriva and Symbicort. Informed patient will mail these applications back out to her.  April D Calhoun, [place] Pharmacist Assistant [phone]   Follow-Up:  Pharmacist Review

## 2020-03-20 ENCOUNTER — Ambulatory Visit (INDEPENDENT_AMBULATORY_CARE_PROVIDER_SITE_OTHER): Payer: Medicare HMO | Admitting: Physician Assistant

## 2020-03-20 ENCOUNTER — Encounter: Payer: Self-pay | Admitting: Physician Assistant

## 2020-03-20 ENCOUNTER — Other Ambulatory Visit: Payer: Self-pay

## 2020-03-20 VITALS — BP 126/84 | HR 92 | Temp 98.2°F | Ht 62.5 in | Wt 217.4 lb

## 2020-03-20 DIAGNOSIS — M159 Polyosteoarthritis, unspecified: Secondary | ICD-10-CM

## 2020-03-20 DIAGNOSIS — R69 Illness, unspecified: Secondary | ICD-10-CM | POA: Diagnosis not present

## 2020-03-20 DIAGNOSIS — J449 Chronic obstructive pulmonary disease, unspecified: Secondary | ICD-10-CM | POA: Diagnosis not present

## 2020-03-20 DIAGNOSIS — F17210 Nicotine dependence, cigarettes, uncomplicated: Secondary | ICD-10-CM

## 2020-03-20 DIAGNOSIS — E119 Type 2 diabetes mellitus without complications: Secondary | ICD-10-CM | POA: Diagnosis not present

## 2020-03-20 DIAGNOSIS — F419 Anxiety disorder, unspecified: Secondary | ICD-10-CM | POA: Diagnosis not present

## 2020-03-20 DIAGNOSIS — F32A Depression, unspecified: Secondary | ICD-10-CM | POA: Diagnosis not present

## 2020-03-20 DIAGNOSIS — G894 Chronic pain syndrome: Secondary | ICD-10-CM | POA: Diagnosis not present

## 2020-03-20 NOTE — Progress Notes (Signed)
New Patient Office Visit  Subjective:  Patient ID: [name], female    DOB: [birth year]  Age: 65 y.o. MRN: [id number]  CC:  Chief Complaint  Patient presents with  . Headache  *Please note that there were no headaches discussed at this visit with me today. Perhaps an error placed in chart by CMA.  HPI [name] presents for Transfer of Care from [place], [place]. She would like to establish care and address some of her main concerns today.   Anxiety and depression: Sounds like this has been lifelong, with several toxic relationships in her life. In a strained marriage. Reports that her daughter overdosed in her house. She also has a son not speaking to her. Now her 19 yo grandson has been living with her and is not living by the rules of her house.   She has previously seen a counselor and psychiatrist, which pt states was not helpful and a waste of time. She takes Effexor XR 150 mg once daily, Buspar 7.5 mg BID, and Alprazolam 1 mg QID (which patient says she has been on for years). She denies any SI / HI. She is requesting that I take over management over her psych medications.  Chronic pain syndrome and OA:  She also takes Norco 10-325 mg TID for chronic pain syndrome and osteoarthritis, which was being prescribed by Elyn Aquas, PA-C. She would like a higher dose because she does not feel like this is working well and has had to take "sometimes four" per day.  New onset of Type II DM: Monitors sugars daily. HA1c 6.5 on 03/01/20 compared to 12 mo ago was 9.3. She is happy to report about her weight loss so far by working on dietary changes.  Specialists involved in her care per report of patient:  Dr. Ardis Hughs GI - chronic abdominal pain, gastritis, GERD, diarrhea Pulmonologist- COPD (still smoking), OSA, chronic resp failure with hypoxia Dr. Alvan Dame - knee and hip arthritis issues Dr. Gladstone Lighter - degenerative lumbar spinal stenosis Dr. Jannifer Franklin, Neurology - memory changes  Past  Medical History:  Diagnosis Date  . Allergic rhinitis   . Anemia   . Anxiety   . Chicken pox   . Chronic back pain   . COPD (chronic obstructive pulmonary disease) ([place])   . Depression   . DM (diabetes mellitus) ([place])   . Essential hypertension   . GERD (gastroesophageal reflux disease)   . Headache    migraines  . History of gastritis    EGD 2015  . History of home oxygen therapy    2 liters at hs last 6 months  . Hyperlipidemia   . Hypothyroidism   . Migraines   . Osteoarthritis    oa  . Scoliosis     Past Surgical History:  Procedure Laterality Date  . APPENDECTOMY     1985  . BIOPSY  07/24/2017   Procedure: BIOPSY;  Surgeon: Milus Banister, MD;  Location: [facility];  Service: Endoscopy;;  . CARDIAC CATHETERIZATION N/A 10/31/2015   Procedure: Left Heart Cath and Coronary Angiography;  Surgeon: Leonie Man, MD;  Location: [facility];  Service: Cardiovascular;  Laterality: N/A;  . CARPAL TUNNEL RELEASE Left   . CARPAL TUNNEL RELEASE Right   . CHOLECYSTECTOMY  late 1980's  . COLONOSCOPY WITH PROPOFOL N/A 07/24/2017   Procedure: COLONOSCOPY WITH PROPOFOL;  Surgeon: Milus Banister, MD;  Location: [facility];  Service: Endoscopy;  Laterality: N/A;  . ESOPHAGOGASTRODUODENOSCOPY  N/A 07/24/2017   Procedure: ESOPHAGOGASTRODUODENOSCOPY (EGD);  Surgeon: Milus Banister, MD;  Location: [facility];  Service: Endoscopy;  Laterality: N/A;  . GALLBLADDER SURGERY  1991  . HIP CLOSED REDUCTION Right 01/08/2016   Procedure: CLOSED MANIPULATION HIP;  Surgeon: Susa Day, MD;  Location: [facility];  Service: Orthopedics;  Laterality: Right;  . HIP CLOSED REDUCTION Right 01/19/2016   Procedure: ATTEMPTED CLOSED REDUCTION RIGHT HIP;  Surgeon: Wylene Simmer, MD;  Location: [facility];  Service: Orthopedics;  Laterality: Right;  . HIP CLOSED REDUCTION Right 01/20/2016   Procedure: CLOSED REDUCTION RIGHT TOTAL HIP;  Surgeon: Paralee Cancel, MD;  Location: [facility];  Service:  Orthopedics;  Laterality: Right;  . HIP CLOSED REDUCTION Right 02/17/2016   Procedure: CLOSED REDUCTION RIGHT TOTAL HIP;  Surgeon: Rod Can, MD;  Location: [facility];  Service: Orthopedics;  Laterality: Right;  . HIP CLOSED REDUCTION Right 02/28/2016   Procedure: CLOSED REDUCTION HIP;  Surgeon: Nicholes Stairs, MD;  Location: [facility];  Service: Orthopedics;  Laterality: Right;  . POLYPECTOMY  07/24/2017   Procedure: POLYPECTOMY;  Surgeon: Milus Banister, MD;  Location: [facility];  Service: Endoscopy;;  . TONSILLECTOMY    . TOTAL ABDOMINAL HYSTERECTOMY     1985, with 1 ovary removed and 2 nd ovary removed 2003  . TOTAL HIP ARTHROPLASTY Right    Original surgery 2006 with revision 2010  . TOTAL HIP REVISION Right 01/01/2016   Procedure: TOTAL HIP REVISION;  Surgeon: Paralee Cancel, MD;  Location: [facility];  Service: Orthopedics;  Laterality: Right;  . TOTAL HIP REVISION Right 03/02/2016   Procedure: TOTAL HIP REVISION;  Surgeon: Paralee Cancel, MD;  Location: [facility];  Service: Orthopedics;  Laterality: Right;  . TOTAL HIP REVISION Right 09/02/2016   Procedure: Right hip constrained liner- posterior;  Surgeon: Paralee Cancel, MD;  Location: [facility];  Service: Orthopedics;  Laterality: Right;  . ULNAR NERVE TRANSPOSITION Right     Family History  Problem Relation Age of Onset  . COPD Mother   . Heart disease Mother   . Lung disease Father        Asbestosis  . Heart attack Father   . Heart disease Father   . Cerebral aneurysm Brother   . Aneurysm Brother        Brain  . Epilepsy Son   . Alcohol abuse Son   . Arthritis Maternal Grandmother   . Heart disease Maternal Grandmother   . Asthma Maternal Grandfather   . Cancer Maternal Grandfather   . Arthritis Paternal Grandmother   . Heart disease Paternal Grandmother   . Stroke Paternal Grandmother   . Early death Paternal Grandfather   . Heart disease Paternal Grandfather     Social History   Socioeconomic History  . Marital  status: Married    Spouse name: Not on file  . Number of children: Not on file  . Years of education: Not on file  . Highest education level: Not on file  Occupational History  . Not on file  Tobacco Use  . Smoking status: Current Every Day Smoker    Packs/day: 1.50    Years: 46.00    Pack years: 69.00    Types: Cigarettes  . Smokeless tobacco: Never Used  Vaping Use  . Vaping Use: Never used  Substance and Sexual Activity  . Alcohol use: No  . Drug use: No  . Sexual activity: Yes  Other Topics Concern  . Not on file  Social History  Narrative   Right handed    Caffeine~ 2 cups per day    Lives at home with husband (strained relationship)   Primary caretaker for disabled brother who had aneurism   Daughter died 06/20/2018    Social Determinants of Health   Financial Resource Strain: Low Risk   . Difficulty of Paying Living Expenses: Not hard at all  Food Insecurity: No Food Insecurity  . Worried About Charity fundraiser in the Last Year: Never true  . Ran Out of Food in the Last Year: Never true  Transportation Needs: No Transportation Needs  . Lack of Transportation (Medical): No  . Lack of Transportation (Non-Medical): No  Physical Activity: Inactive  . Days of Exercise per Week: 0 days  . Minutes of Exercise per Session: 0 min  Stress: Stress Concern Present  . Feeling of Stress : To some extent  Social Connections: Moderately Integrated  . Frequency of Communication with Friends and Family: More than three times a week  . Frequency of Social Gatherings with Friends and Family: Once a week  . Attends Religious Services: 1 to 4 times per year  . Active Member of Clubs or Organizations: No  . Attends Archivist Meetings: Never  . Marital Status: Married  Human resources officer Violence: Not At Risk  . Fear of Current or Ex-Partner: No  . Emotionally Abused: No  . Physically Abused: No  . Sexually Abused: No    ROS Review of Systems  Constitutional:  Negative for activity change and unexpected weight change.  Respiratory: Negative for wheezing.   Cardiovascular: Negative for chest pain.  Gastrointestinal: Positive for diarrhea. Negative for blood in stool and constipation.  Musculoskeletal: Positive for arthralgias and back pain.  Neurological: Negative for dizziness.  Psychiatric/Behavioral: Positive for dysphoric mood and sleep disturbance. The patient is nervous/anxious.     Objective:   Today's Vitals: BP 126/84   Pulse 92   Temp 98.2 F (36.8 C)   Ht 5' 2.5" (1.588 m)   Wt 217 lb 6.1 oz (98.6 kg)   SpO2 96%   BMI 39.13 kg/m    Wt Readings from Last 3 Encounters:  03/21/20 217 lb 6.4 oz (98.6 kg)  03/20/20 217 lb 6.1 oz (98.6 kg)  01/11/20 215 lb (97.5 kg)     Physical Exam Vitals and nursing note reviewed.  Constitutional:      Appearance: Normal appearance. She is obese. She is not toxic-appearing.  HENT:     Head: Normocephalic and atraumatic.     Right Ear: Tympanic membrane, ear canal and external ear normal.     Left Ear: Tympanic membrane, ear canal and external ear normal.     Nose: Nose normal.     Mouth/Throat:     Mouth: Mucous membranes are moist.  Eyes:     Extraocular Movements: Extraocular movements intact.     Conjunctiva/sclera: Conjunctivae normal.     Pupils: Pupils are equal, round, and reactive to light.  Cardiovascular:     Rate and Rhythm: Normal rate and regular rhythm.     Pulses: Normal pulses.     Heart sounds: Normal heart sounds.  Pulmonary:     Effort: Pulmonary effort is normal.     Breath sounds: Wheezing present.  Abdominal:     General: Abdomen is flat. Bowel sounds are normal.     Palpations: Abdomen is soft.  Musculoskeletal:        General: Normal range of motion.  Cervical back: Normal range of motion and neck supple.  Skin:    General: Skin is warm and dry.  Neurological:     General: No focal deficit present.     Mental Status: She is alert and oriented to  person, place, and time.  Psychiatric:        Mood and Affect: Mood is anxious and depressed.        Behavior: Behavior normal.        Thought Content: Thought content normal.        Judgment: Judgment normal.     Assessment & Plan:   Problem List Items Addressed This Visit      Respiratory   COPD GOLD II still smoking      Endocrine   Diabetes mellitus, new onset ([place])     Musculoskeletal and Integument   GEN OSTEOARTHROSIS INVOLVING MULTIPLE SITES     Other   Cigarette smoker   Anxiety and depression - Primary   Chronic pain syndrome      Outpatient Encounter Medications as of 03/20/2020  Medication Sig  . Accu-Chek Softclix Lancets lancets 4 (four) times daily.  Marland Kitchen albuterol (PROAIR HFA) 108 (90 Base) MCG/ACT inhaler 2 puffs every 4 hours as needed only  if your can't catch your breath (Patient taking differently: Inhale 2 puffs into the lungs every 4 (four) hours as needed for wheezing or shortness of breath. if your can't catch your breath)  . ALPRAZolam (XANAX) 1 MG tablet TAKE 1 TABLET BY MOUTH EVERY MORNING, TAKE 1 TABLET AT NOON, TAKE 1 TABLET EVERY EVENING, AND TAKE 1 TABLET AT BEDTIME  . atorvastatin (LIPITOR) 20 MG tablet TAKE 1 TABLET BY MOUTH AT BEDTIME  . benzonatate (TESSALON) 100 MG capsule Take 1 capsule (100 mg total) by mouth 3 (three) times daily as needed for cough. (Patient not taking: Reported on 03/21/2020)  . busPIRone (BUSPAR) 7.5 MG tablet TAKE 1 TABLET BY MOUTH TWICE DAILY  . fluticasone (FLONASE) 50 MCG/ACT nasal spray Place 2 sprays into both nostrils daily.  . furosemide (LASIX) 20 MG tablet TAKE 1 TABLET BY MOUTH EVERY DAY AS NEEDED  . glucose blood (ACCU-CHEK GUIDE) test strip 1 each by Other route 3 (three) times daily as needed for other. as directed Dx E11.9  . HYDROcodone-acetaminophen (NORCO) 10-325 MG tablet Take 1 tablet by mouth every 8 (eight) hours as needed.  Marland Kitchen levothyroxine (SYNTHROID) 100 MCG tablet TAKE 1 TABLET BY MOUTH EVERY DAY  BEFORE BREAKFAST  . loperamide (IMODIUM A-D) 2 MG tablet Take 1 tablet (2 mg total) by mouth in the morning and at bedtime.  Marland Kitchen loratadine (CLARITIN) 10 MG tablet Take 10 mg by mouth daily.  Marland Kitchen omeprazole (PRILOSEC) 40 MG capsule TAKE ONE CAPSULE BY MOUTH DAILY BEFORE BREAKFAST and TAKE ONE CAPSULE DAILY BEFORE DINNER  . ondansetron (ZOFRAN) 4 MG tablet Take 1 tablet (4 mg total) by mouth as needed for nausea.  . OXYGEN Inhale 2 L into the lungs at bedtime.   . potassium chloride (KLOR-CON) 10 MEQ tablet TAKE 1 TABLET BY MOUTH AT BEDTIME  . triamterene-hydrochlorothiazide (MAXZIDE-25) 37.5-25 MG tablet TAKE 1 TABLET BY MOUTH EVERY DAY  . [DISCONTINUED] venlafaxine XR (EFFEXOR-XR) 150 MG 24 hr capsule 225 mg daily. Take along with 75 mg tab (Patient not taking: Reported on 03/21/2020)  . SYMBICORT 160-4.5 MCG/ACT inhaler INHALE TWO PUFFS BY MOUTH TWICE DAILY (Patient not taking: No sig reported)  . Tiotropium Bromide Monohydrate (SPIRIVA RESPIMAT) 2.5 MCG/ACT AERS Inhale  2 puffs into the lungs daily. (Patient not taking: No sig reported)  . venlafaxine XR (EFFEXOR XR) 75 MG 24 hr capsule 225 mg daily. Take along with 150 mg tab (Patient taking differently: Take by mouth daily. 150 mg daily)   No facility-administered encounter medications on file as of 03/20/2020.    Follow-up: Return in about 6 months (around 09/17/2020) for CPE.   1. Anxiety and depression Lifelong, does not seem to be improving, but not worsening either. She takes Effexor XR 150 mg once daily, Buspar 7.5 mg BID, and Alprazolam 1 mg QID. I am not comfortable with the dose of Alprazolam she is taking, ESPECIALLY while she is taking the Norco for pain as well. This co-prescription is dangerous and can result in decreased respiratory drive, coma, or death. PDMP was reviewed today. Last filled on 03/12/20 for 30 day supply of the Alprazolam 1 mg.   I strongly recommend she start to see psychiatry again. If I were to take over her  medications, there would be a weaning process off the Alprazolam.   2. Chronic pain syndrome 5. GEN OSTEOARTHROSIS INVOLVING MULTIPLE SITES Uncontrolled. Pt is requesting higher dose of Norco. She has 8 tablets left, present with her in exam room today. Again, I am not comfortable with patient taking Norco for her pain WITH Alprazolam. This co-prescription is dangerous and can result in decreased respiratory drive, coma, or death. PDMP was reviewed today. Last filled Norco 10-325 mg on 02/27/20 for 30 day supply, indicating next fill date is not until 03/28/20.   She will need to see pain management. I will fill for 30 day increments for the next three months only until she can get in to see them. Also, she will need to pass a urine drug screen before this medication is filled for her.   3. Cigarette smoker 4. COPD GOLD II still smoking  She is following with pulmonology. It is STRONGLY advised she discontinue smoking.  6. Diabetes mellitus, new onset ([place]) Currently diet controlled. Last HA1c was 6.5 on 03/01/20. Will continue to monitor. Encouraged to keep up good work on this.  This visit occurred during the SARS-CoV-2 public health emergency.  Safety protocols were in place, including screening questions prior to the visit, additional usage of staff PPE, and extensive cleaning of exam room while observing appropriate contact time as indicated for disinfecting solutions.   Total time spent today obtaining information from patient, as well as reviewing notes from specialists, medication history, and PDMP: 104 minutes.  Armon Orvis M Romy Ipock, PA-C

## 2020-03-20 NOTE — Patient Instructions (Signed)

## 2020-03-21 ENCOUNTER — Encounter: Payer: Self-pay | Admitting: Adult Health

## 2020-03-21 ENCOUNTER — Telehealth: Payer: Self-pay | Admitting: Physician Assistant

## 2020-03-21 ENCOUNTER — Ambulatory Visit: Payer: Medicare HMO | Admitting: Adult Health

## 2020-03-21 DIAGNOSIS — J9611 Chronic respiratory failure with hypoxia: Secondary | ICD-10-CM | POA: Diagnosis not present

## 2020-03-21 DIAGNOSIS — F17210 Nicotine dependence, cigarettes, uncomplicated: Secondary | ICD-10-CM

## 2020-03-21 DIAGNOSIS — R69 Illness, unspecified: Secondary | ICD-10-CM | POA: Diagnosis not present

## 2020-03-21 DIAGNOSIS — J449 Chronic obstructive pulmonary disease, unspecified: Secondary | ICD-10-CM

## 2020-03-21 MED ORDER — IPRATROPIUM-ALBUTEROL 0.5-2.5 (3) MG/3ML IN SOLN: 3.0000 mL | Freq: Two times a day (BID) | RESPIRATORY_TRACT | 2 refills | Status: DC

## 2020-03-21 NOTE — Assessment & Plan Note (Signed)
Nocturnal hypoxemia.  Patient is not wearing her oxygen.  She says she does not want her wear this anymore and declines further oxygen usage.  Patient education was given.  We will send order to DME company to discontinue O2

## 2020-03-21 NOTE — Patient Instructions (Addendum)
Begin Albuterol and Ipratropium Neb Twice daily   D/c oxygen .  Work on not smoking.  Refer to LDCT chest screening program .  Follow up in 4  months with Dr. Melvyn Novas  Or Zalyn Amend NP  Please contact office for sooner follow up if symptoms do not improve or worsen or seek emergency care

## 2020-03-21 NOTE — Assessment & Plan Note (Signed)
Heavy smoking-smoking cessation discussed in detail.  Referral to the low-dose CT screening program.

## 2020-03-21 NOTE — Addendum Note (Signed)
Addended by: Vanessa Barbara on: 03/21/2020 05:21 PM   Modules accepted: Orders

## 2020-03-21 NOTE — Assessment & Plan Note (Signed)
Healthy weight loss discussed 

## 2020-03-21 NOTE — Addendum Note (Signed)
Addended by: Vanessa Barbara on: 03/21/2020 05:12 PM   Modules accepted: Orders

## 2020-03-21 NOTE — Assessment & Plan Note (Signed)
Moderate COPD currently stable.  Patient is encouraged on smoking cessation.  Referral to the low-dose CT screening program.  Patient is not taking her maintenance regimen with Symbicort and Spiriva. May remain off of Symbicort and Spiriva for now.  Will add albuterol ipratropium nebulizer twice daily.  Plan  Patient Instructions  Begin Albuterol and Ipratropium Neb Twice daily   D/c oxygen .  Work on not smoking.  Refer to LDCT chest screening program .  Follow up in 4  months with Dr. Melvyn Novas  Or Jovonda Selner NP  Please contact office for sooner follow up if symptoms do not improve or worsen or seek emergency care

## 2020-03-21 NOTE — Progress Notes (Signed)
@Patient  ID: [name], female    DOB: 04-30-1955, 65 y.o.   MRN: [id number]  Chief Complaint  Patient presents with  . Follow-up    Referring provider: Delorse Limber  HPI: 65 year old female active smoker seen for initial pulmonary consult February 2019 for shortness of breath found to have moderate COPD with reversibility.  Patient has chronic respiratory failure and is oxygen pendant on 2 L of oxygen at bedtime Patient is disabled from orthopedic issues Medical history significant for dyslipidemia, hypertension, anemia   TEST/EVENTS :  Spirometry 2/19/2019FEV1 1.54 (66%) Ratio 75 with min curvature p am symb - Allergy profile 03/18/17 >Eos 0.3 / IgE 4 neg RAST  - PFT's 4/29/2019FEV1 1.60 (63 % ) ratio 67 p 14 % improvement from saba p sym/ spriva prior to study with DLCO 55 % corrects to 63 % for alv volume with EOS up to 0.8 HRCT chest 12/24/2017-no ILD, mild to moderate emphysema Alpha-1 December 05, 2017 normal, MM, 174   03/21/2020 Follow up : COPD and oxygen dependent respiratory failure on nocturnal oxygen at 2 L Patient returns for a 65-month follow-up.  Patient says overall breathing is doing about the same. Gets winded with activities. Sedentary lifestyle. No flare in cough or wheezing .  She says she has not taken Symbicort and Spiriva.  She says her insurance will cover it but is too much for her copayments.  She has tried patient assistance in the past without approval.  She has not taken this in several months.  She would like a simpler regimen.  We discussed a nebulizer with albuterol and ipratropium she says she would like to try this. She is also not wearing her oxygen.  She says she does not want a wear oxygen anymore she has not worn it for several months.  She does not feel like it helps.  And if she does wear it sometimes she gets tangled up in the cord.  She takes it all throughout the night.  She wants it to be sent back to the homecare  company. Patient education on COPD and hypoxemia. She does continue smoke.  We discussed smoking cessation  Allergies  Allergen Reactions  . Nsaids Diarrhea  . Aleve [Naproxen Sodium] Other (See Comments)    Headache   . Codeine Nausea Only and Other (See Comments)    GI upset  . Penicillins Nausea Only and Other (See Comments)    GI upset   . Sulfonamide Derivatives Hives    Immunization History  Administered Date(s) Administered  . Influenza,inj,Quad PF,6+ Mos 10/02/2018, 11/30/2019  . Influenza-Unspecified 01/16/2011, 11/26/2011, 12/22/2012, 11/17/2018  . PFIZER(Purple Top)SARS-COV-2 Vaccination 10/20/2019, 11/10/2019  . Td 06/07/2005    Past Medical History:  Diagnosis Date  . Allergic rhinitis   . Anemia   . Anxiety   . Chicken pox   . Chronic back pain   . COPD (chronic obstructive pulmonary disease) ([place])   . Depression   . DM (diabetes mellitus) ([place])   . Essential hypertension   . GERD (gastroesophageal reflux disease)   . Headache    migraines  . History of gastritis    EGD 2015  . History of home oxygen therapy    2 liters at hs last 6 months  . Hyperlipidemia   . Hypothyroidism   . Migraines   . Osteoarthritis    oa  . Scoliosis     Tobacco History: Social History   Tobacco Use  Smoking Status Current  Every Day Smoker  . Packs/day: 1.50  . Years: 46.00  . Pack years: 69.00  . Types: Cigarettes  Smokeless Tobacco Never Used   Ready to quit: Not Answered Counseling given: Not Answered   Outpatient Medications Prior to Visit  Medication Sig Dispense Refill  . Accu-Chek Softclix Lancets lancets 4 (four) times daily.    Marland Kitchen albuterol (PROAIR HFA) 108 (90 Base) MCG/ACT inhaler 2 puffs every 4 hours as needed only  if your can't catch your breath (Patient taking differently: Inhale 2 puffs into the lungs every 4 (four) hours as needed for wheezing or shortness of breath. if your can't catch your breath) 6.7 g 0  . ALPRAZolam (XANAX) 1 MG  tablet TAKE 1 TABLET BY MOUTH EVERY MORNING, TAKE 1 TABLET AT NOON, TAKE 1 TABLET EVERY EVENING, AND TAKE 1 TABLET AT BEDTIME 120 tablet 0  . Aspirin-Acetaminophen-Caffeine (GOODY HEADACHE PO) Take 1 tablet by mouth as needed. As needed    . atorvastatin (LIPITOR) 20 MG tablet TAKE 1 TABLET BY MOUTH AT BEDTIME 30 tablet 1  . busPIRone (BUSPAR) 7.5 MG tablet TAKE 1 TABLET BY MOUTH TWICE DAILY 60 tablet 1  . fluticasone (FLONASE) 50 MCG/ACT nasal spray Place 2 sprays into both nostrils daily. 16 g 6  . furosemide (LASIX) 20 MG tablet TAKE 1 TABLET BY MOUTH EVERY DAY AS NEEDED 30 tablet 90  . glucose blood (ACCU-CHEK GUIDE) test strip 1 each by Other route 3 (three) times daily as needed for other. as directed Dx E11.9 100 strip 11  . HYDROcodone-acetaminophen (NORCO) 10-325 MG tablet Take 1 tablet by mouth every 8 (eight) hours as needed. 90 tablet 0  . levothyroxine (SYNTHROID) 100 MCG tablet TAKE 1 TABLET BY MOUTH EVERY DAY BEFORE BREAKFAST 90 tablet 1  . loperamide (IMODIUM A-D) 2 MG tablet Take 1 tablet (2 mg total) by mouth in the morning and at bedtime. 60 tablet 3  . loratadine (CLARITIN) 10 MG tablet Take 10 mg by mouth daily.    Marland Kitchen omeprazole (PRILOSEC) 40 MG capsule TAKE ONE CAPSULE BY MOUTH DAILY BEFORE BREAKFAST and TAKE ONE CAPSULE DAILY BEFORE DINNER 60 capsule 2  . ondansetron (ZOFRAN) 4 MG tablet Take 1 tablet (4 mg total) by mouth as needed for nausea. 50 tablet 6  . OXYGEN Inhale 2 L into the lungs at bedtime.     . potassium chloride (KLOR-CON) 10 MEQ tablet TAKE 1 TABLET BY MOUTH AT BEDTIME 90 tablet 0  . triamterene-hydrochlorothiazide (MAXZIDE-25) 37.5-25 MG tablet TAKE 1 TABLET BY MOUTH EVERY DAY 90 tablet 1  . venlafaxine XR (EFFEXOR XR) 75 MG 24 hr capsule 225 mg daily. Take along with 150 mg tab (Patient taking differently: Take by mouth daily. 150 mg daily) 30 capsule 1  . benzonatate (TESSALON) 100 MG capsule Take 1 capsule (100 mg total) by mouth 3 (three) times daily as  needed for cough. (Patient not taking: Reported on 03/21/2020) 30 capsule 0  . SYMBICORT 160-4.5 MCG/ACT inhaler INHALE TWO PUFFS BY MOUTH TWICE DAILY (Patient not taking: No sig reported) 10.2 g 5  . Tiotropium Bromide Monohydrate (SPIRIVA RESPIMAT) 2.5 MCG/ACT AERS Inhale 2 puffs into the lungs daily. (Patient not taking: No sig reported) 12 g 0  . venlafaxine XR (EFFEXOR-XR) 150 MG 24 hr capsule 225 mg daily. Take along with 75 mg tab (Patient not taking: Reported on 03/21/2020) 30 capsule 1   No facility-administered medications prior to visit.     Review of Systems:  Constitutional:   No  weight loss, night sweats,  Fevers, chills,  +fatigue, or  lassitude.  HEENT:   No headaches,  Difficulty swallowing,  Tooth/dental problems, or  Sore throat,                No sneezing, itching, ear ache, nasal congestion, post nasal drip,   CV:  No chest pain,  Orthopnea, PND, swelling in lower extremities, anasarca, dizziness, palpitations, syncope.   GI  No heartburn, indigestion, abdominal pain, nausea, vomiting, diarrhea, change in bowel habits, loss of appetite, bloody stools.   Resp:   No chest wall deformity  Skin: no rash or lesions.  GU: no dysuria, change in color of urine, no urgency or frequency.  No flank pain, no hematuria   MS:  No joint pain or swelling.  No decreased range of motion.  No back pain.    Physical Exam  BP 110/78 (BP Location: Left Arm, Patient Position: Sitting, Cuff Size: Large)   Pulse (!) 110   Temp (!) 97.2 F (36.2 C) (Temporal)   Ht 5' 2.5" (1.588 m)   Wt 217 lb 6.4 oz (98.6 kg)   SpO2 93%   BMI 39.13 kg/m   GEN: A/Ox3; pleasant , NAD, BMI 39  HEENT:  [place]/AT,   NOSE-clear, THROAT-clear, no lesions, no postnasal drip or exudate noted.   NECK:  Supple w/ fair ROM; no JVD; normal carotid impulses w/o bruits; no thyromegaly or nodules palpated; no lymphadenopathy.    RESP  Clear  P & A; w/o, wheezes/ rales/ or rhonchi. no accessory muscle use, no  dullness to percussion  CARD:  RRR, no m/r/g, tr peripheral edema, pulses intact, no cyanosis or clubbing.  GI:   Soft & nt; nml bowel sounds; no organomegaly or masses detected.   Musco: Warm bil, no deformities or joint swelling noted.   Neuro: alert, no focal deficits noted.    Skin: Warm, no lesions or rashes    Lab Results:  CBC   BMET  ProBNP  No results found.    PFT Results Latest Ref Rng & Units 05/26/2017  FVC-Pre L 2.23  FVC-Predicted Pre % 68  FVC-Post L 2.38  FVC-Predicted Post % 72  Pre FEV1/FVC % % 63  Post FEV1/FCV % % 67  FEV1-Pre L 1.40  FEV1-Predicted Pre % 55  FEV1-Post L 1.60  DLCO uncorrected ml/min/mmHg 13.35  DLCO UNC% % 55  DLVA Predicted % 63  TLC L 5.32  TLC % Predicted % 105  RV % Predicted % 141    No results found for: NITRICOXIDE      Assessment & Plan:   COPD GOLD II still smoking  Moderate COPD currently stable.  Patient is encouraged on smoking cessation.  Referral to the low-dose CT screening program.  Patient is not taking her maintenance regimen with Symbicort and Spiriva. May remain off of Symbicort and Spiriva for now.  Will add albuterol ipratropium nebulizer twice daily.  Plan  Patient Instructions  Begin Albuterol and Ipratropium Neb Twice daily   D/c oxygen .  Work on not smoking.  Refer to LDCT chest screening program .  Follow up in 4  months with Dr. Melvyn Novas  Or Madysin Crisp NP  Please contact office for sooner follow up if symptoms do not improve or worsen or seek emergency care         Chronic respiratory failure with hypoxia ([place]) Nocturnal hypoxemia.  Patient is not wearing her oxygen.  She says she  does not want her wear this anymore and declines further oxygen usage.  Patient education was given.  We will send order to DME company to discontinue O2  Morbid obesity due to excess calories ([place]) Healthy weight loss discussed  Cigarette smoker Heavy smoking-smoking cessation discussed in detail.   Referral to the low-dose CT screening program.     Rexene Edison, NP 03/21/2020

## 2020-03-22 ENCOUNTER — Telehealth: Payer: Self-pay

## 2020-03-22 ENCOUNTER — Other Ambulatory Visit: Payer: Self-pay | Admitting: Family Medicine

## 2020-03-22 DIAGNOSIS — G894 Chronic pain syndrome: Secondary | ICD-10-CM

## 2020-03-22 NOTE — Telephone Encounter (Signed)
Notified patient,she voices understanding. She agree to terms below

## 2020-03-22 NOTE — Telephone Encounter (Signed)
Patient agrees to Psych and pain management. She will be in next Friday

## 2020-03-22 NOTE — Telephone Encounter (Signed)
Is she ok with referrals for psych, pain management, or both? Also, when can she sign pain contract? If / when she does come in to sign the pain contract we will do same day urine drug screening.

## 2020-03-22 NOTE — Telephone Encounter (Signed)
  LAST APPOINTMENT DATE: 03/20/2020   NEXT APPOINTMENT DATE:@Visit  date not found  MEDICATION:HYDROcodone-acetaminophen (NORCO) 10-325 MG tablet  [place], [place]

## 2020-03-22 NOTE — Telephone Encounter (Signed)
[place]. Please place referrals to psych and pain management. I will not be able to refill the Norco until pain contract is signed (and internal note to [place] - UDS is resulted).

## 2020-03-23 ENCOUNTER — Other Ambulatory Visit: Payer: Self-pay

## 2020-03-23 ENCOUNTER — Other Ambulatory Visit: Payer: Self-pay | Admitting: Physician Assistant

## 2020-03-23 DIAGNOSIS — F419 Anxiety disorder, unspecified: Secondary | ICD-10-CM

## 2020-03-23 DIAGNOSIS — G894 Chronic pain syndrome: Secondary | ICD-10-CM

## 2020-03-23 DIAGNOSIS — F1190 Opioid use, unspecified, uncomplicated: Secondary | ICD-10-CM

## 2020-03-23 DIAGNOSIS — F32A Depression, unspecified: Secondary | ICD-10-CM

## 2020-03-23 NOTE — Telephone Encounter (Signed)
I placed the referrals for psych and pain can you place others

## 2020-03-23 NOTE — Telephone Encounter (Signed)
Rx Request 

## 2020-03-23 NOTE — Telephone Encounter (Signed)
Patient notified

## 2020-03-23 NOTE — Telephone Encounter (Signed)
We will need to have a pain contract signed and UDS done in office prior to first refill of this medication.

## 2020-03-24 ENCOUNTER — Telehealth: Payer: Self-pay

## 2020-03-24 DIAGNOSIS — Z6839 Body mass index (BMI) 39.0-39.9, adult: Secondary | ICD-10-CM | POA: Diagnosis not present

## 2020-03-24 DIAGNOSIS — R69 Illness, unspecified: Secondary | ICD-10-CM | POA: Diagnosis not present

## 2020-03-24 DIAGNOSIS — F17210 Nicotine dependence, cigarettes, uncomplicated: Secondary | ICD-10-CM | POA: Diagnosis not present

## 2020-03-24 DIAGNOSIS — Z79899 Other long term (current) drug therapy: Secondary | ICD-10-CM | POA: Diagnosis not present

## 2020-03-24 DIAGNOSIS — I10 Essential (primary) hypertension: Secondary | ICD-10-CM | POA: Diagnosis not present

## 2020-03-24 DIAGNOSIS — E1140 Type 2 diabetes mellitus with diabetic neuropathy, unspecified: Secondary | ICD-10-CM | POA: Diagnosis not present

## 2020-03-24 DIAGNOSIS — E1165 Type 2 diabetes mellitus with hyperglycemia: Secondary | ICD-10-CM | POA: Diagnosis not present

## 2020-03-24 NOTE — Chronic Care Management (AMB) (Addendum)
Chronic Care Management Pharmacy Assistant   Name: [name]  MRN: [id number] DOB: [birth year]  Reason for Encounter: Chart Review  PCP : [name], Kendra Evens, PA-C  Allergies:   Allergies  Allergen Reactions  . Nsaids Diarrhea  . Aleve [Naproxen Sodium] Other (See Comments)    Headache   . Codeine Nausea Only and Other (See Comments)    GI upset  . Penicillins Nausea Only and Other (See Comments)    GI upset   . Sulfonamide Derivatives Hives    Medications: Outpatient Encounter Medications as of 03/24/2020  Medication Sig  . Accu-Chek Softclix Lancets lancets 4 (four) times daily.  Marland Kitchen albuterol (PROAIR HFA) 108 (90 Base) MCG/ACT inhaler 2 puffs every 4 hours as needed only  if your can't catch your breath (Patient taking differently: Inhale 2 puffs into the lungs every 4 (four) hours as needed for wheezing or shortness of breath. if your can't catch your breath)  . ALPRAZolam (XANAX) 1 MG tablet TAKE 1 TABLET BY MOUTH EVERY MORNING, TAKE 1 TABLET AT NOON, TAKE 1 TABLET EVERY EVENING, AND TAKE 1 TABLET AT BEDTIME  . Aspirin-Acetaminophen-Caffeine (GOODY HEADACHE PO) Take 1 tablet by mouth as needed. As needed  . atorvastatin (LIPITOR) 20 MG tablet TAKE 1 TABLET BY MOUTH AT BEDTIME  . benzonatate (TESSALON) 100 MG capsule Take 1 capsule (100 mg total) by mouth 3 (three) times daily as needed for cough. (Patient not taking: Reported on 03/21/2020)  . busPIRone (BUSPAR) 7.5 MG tablet TAKE 1 TABLET BY MOUTH TWICE DAILY  . fluticasone (FLONASE) 50 MCG/ACT nasal spray Place 2 sprays into both nostrils daily.  . furosemide (LASIX) 20 MG tablet TAKE 1 TABLET BY MOUTH EVERY DAY AS NEEDED  . glucose blood (ACCU-CHEK GUIDE) test strip 1 each by Other route 3 (three) times daily as needed for other. as directed Dx E11.9  . HYDROcodone-acetaminophen (NORCO) 10-325 MG tablet Take 1 tablet by mouth every 8 (eight) hours as needed.  Marland Kitchen ipratropium-albuterol (DUONEB) 0.5-2.5 (3) MG/3ML SOLN  Take 3 mLs by nebulization 2 (two) times daily.  Marland Kitchen levothyroxine (SYNTHROID) 100 MCG tablet TAKE 1 TABLET BY MOUTH EVERY DAY BEFORE BREAKFAST  . loperamide (IMODIUM A-D) 2 MG tablet Take 1 tablet (2 mg total) by mouth in the morning and at bedtime.  Marland Kitchen loratadine (CLARITIN) 10 MG tablet Take 10 mg by mouth daily.  Marland Kitchen omeprazole (PRILOSEC) 40 MG capsule TAKE ONE CAPSULE BY MOUTH DAILY BEFORE BREAKFAST and TAKE ONE CAPSULE DAILY BEFORE DINNER  . ondansetron (ZOFRAN) 4 MG tablet Take 1 tablet (4 mg total) by mouth as needed for nausea.  . OXYGEN Inhale 2 L into the lungs at bedtime.   . potassium chloride (KLOR-CON) 10 MEQ tablet TAKE 1 TABLET BY MOUTH AT BEDTIME  . SYMBICORT 160-4.5 MCG/ACT inhaler INHALE TWO PUFFS BY MOUTH TWICE DAILY (Patient not taking: No sig reported)  . Tiotropium Bromide Monohydrate (SPIRIVA RESPIMAT) 2.5 MCG/ACT AERS Inhale 2 puffs into the lungs daily. (Patient not taking: No sig reported)  . triamterene-hydrochlorothiazide (MAXZIDE-25) 37.5-25 MG tablet TAKE 1 TABLET BY MOUTH EVERY DAY  . venlafaxine XR (EFFEXOR XR) 75 MG 24 hr capsule 225 mg daily. Take along with 150 mg tab (Patient taking differently: Take by mouth daily. 150 mg daily)   No facility-administered encounter medications on file as of 03/24/2020.    Current Diagnosis: Patient Active Problem List   Diagnosis Date Noted  . Diabetes mellitus, new onset ([place]) 04/12/2019  . Tremor  11/05/2018  . Chronic respiratory failure with hypoxia ([place]) 01/12/2018  . GERD with esophagitis 11/26/2017  . Dysphagia 08/20/2017  . Chronic pain syndrome 08/04/2017  . Comorbid sleep-related hypoventilation 08/04/2017  . OSA (obstructive sleep apnea) 08/04/2017  . Hepatic steatosis 08/04/2017  . Polyp of ascending colon   . Diverticulosis of colon without hemorrhage   . Gastritis and gastroduodenitis   . Hypothyroidism 07/22/2017  . Anxiety and depression 07/22/2017  . Iron deficiency anemia 07/22/2017  . Pilar cyst  07/22/2017  . Morbid obesity due to excess calories ([place]) 03/19/2017  . Anemia 03/19/2017  . COPD GOLD II still smoking  03/18/2017  . Degenerative lumbar spinal stenosis 02/17/2017  . S/P right hip revision 09/02/2016  . S/P hip replacement 09/02/2016  . Instability of prosthetic hip ([place]) 03/01/2016  . Dislocation of hip prosthesis ([place]) 02/17/2016  . Abnormal nuclear stress test: Intermediate Risk 10/31/2015  . Atypical angina ([place]) 10/31/2015  . Trigger finger, acquired 08/20/2012  . Cigarette smoker 12/12/2008  . Hyperlipidemia 11/17/2008  . HYPERTENSION, BENIGN 11/16/2008  . GEN OSTEOARTHROSIS INVOLVING MULTIPLE SITES 11/16/2008    Reviewed chart for medication changes.  03/20/2020 OV [name], Alyssa, PA-C; Anxiety and Depression, strongly recommend psychiatry, recommend weaning process of Alprazolam especially while she is taking Norco for pian, refer to pain management.  03/21/2020 OV Pulmonary, [name], Tammy, NP; f/u COPD stable, referral for low-dose CT screening program - may remain off Symbicort and Spiriva for now as patient has not been using her maintenance regimen, add albuterol ipratropium nebulizer twice daily.  [name], [name] Pharmacist Assistant [phone]   Follow-Up:  Pharmacist Review   RPH spent 21 minutes spent reviewing chart/patient updates

## 2020-03-27 ENCOUNTER — Telehealth: Payer: Self-pay | Admitting: Physician Assistant

## 2020-03-27 ENCOUNTER — Telehealth: Payer: Self-pay

## 2020-03-27 ENCOUNTER — Other Ambulatory Visit: Payer: Self-pay | Admitting: Physician Assistant

## 2020-03-27 DIAGNOSIS — J449 Chronic obstructive pulmonary disease, unspecified: Secondary | ICD-10-CM | POA: Diagnosis not present

## 2020-03-27 NOTE — Telephone Encounter (Signed)
Please see other note.  Just received a call about this earlier today.  Patient was requesting to transfer to [facility] practice and I had said that was okay. Isla Pence has already placed a referral to pain management and psychiatry.  Patient is well aware of guidelines that I was requesting prior to management of her medications.  Dr. Jerline Pain you might want to review.

## 2020-03-27 NOTE — Telephone Encounter (Signed)
Patient is calling to see if she can do a TOC to our location. She states that she doesn't think the [facility] office is a good fit for her.  Please advise if transfer is okay.   Patients contact info: [phone] or [phone]

## 2020-03-27 NOTE — Telephone Encounter (Signed)
Patient is requesting a TOC from [facility] to [facility] and is also requesting a referral to her psychiatrist - [facility] in [place]. Patient kept repeating she just needs care

## 2020-03-27 NOTE — Telephone Encounter (Signed)
Not able to accept transfer at this time. Looks like she just transferred to [facility] as well. Would have to check with Tammy but I believe we only allow 1 patient transfer to prevent doctor shopping.  Algis Greenhouse. Jerline Pain, MD 03/27/2020 3:37 PM

## 2020-03-27 NOTE — Telephone Encounter (Signed)
Disregard: Patient is going to continue being seen at [facility].

## 2020-03-27 NOTE — Telephone Encounter (Signed)
I am ok with TOC to your office.

## 2020-03-28 ENCOUNTER — Telehealth: Payer: Self-pay

## 2020-03-28 NOTE — Chronic Care Management (AMB) (Signed)
Chronic Care Management Pharmacy Assistant   Name: [name]  MRN: [id number] DOB: [birth year]  Reason for Encounter: Disease State/ Diabetes Adherence Call  PCP : Fredirick Lathe, PA-C  Allergies:   Allergies  Allergen Reactions  . Nsaids Diarrhea  . Aleve [Naproxen Sodium] Other (See Comments)    Headache   . Codeine Nausea Only and Other (See Comments)    GI upset  . Penicillins Nausea Only and Other (See Comments)    GI upset   . Sulfonamide Derivatives Hives    Medications: Outpatient Encounter Medications as of 03/28/2020  Medication Sig  . Accu-Chek Softclix Lancets lancets 4 (four) times daily.  Marland Kitchen albuterol (PROAIR HFA) 108 (90 Base) MCG/ACT inhaler 2 puffs every 4 hours as needed only  if your can't catch your breath (Patient taking differently: Inhale 2 puffs into the lungs every 4 (four) hours as needed for wheezing or shortness of breath. if your can't catch your breath)  . ALPRAZolam (XANAX) 1 MG tablet TAKE 1 TABLET BY MOUTH EVERY MORNING, TAKE 1 TABLET AT NOON, TAKE 1 TABLET EVERY EVENING, AND TAKE 1 TABLET AT BEDTIME  . Aspirin-Acetaminophen-Caffeine (GOODY HEADACHE PO) Take 1 tablet by mouth as needed. As needed  . atorvastatin (LIPITOR) 20 MG tablet TAKE 1 TABLET BY MOUTH AT BEDTIME  . benzonatate (TESSALON) 100 MG capsule Take 1 capsule (100 mg total) by mouth 3 (three) times daily as needed for cough. (Patient not taking: Reported on 03/21/2020)  . busPIRone (BUSPAR) 7.5 MG tablet TAKE 1 TABLET BY MOUTH TWICE DAILY  . fluticasone (FLONASE) 50 MCG/ACT nasal spray USE TWO SPRAYS IN EACH NOSTRIL EVERY DAY  . furosemide (LASIX) 20 MG tablet TAKE 1 TABLET BY MOUTH EVERY DAY AS NEEDED  . glucose blood (ACCU-CHEK GUIDE) test strip 1 each by Other route 3 (three) times daily as needed for other. as directed Dx E11.9  . HYDROcodone-acetaminophen (NORCO) 10-325 MG tablet Take 1 tablet by mouth every 8 (eight) hours as needed.  Marland Kitchen ipratropium-albuterol (DUONEB)  0.5-2.5 (3) MG/3ML SOLN Take 3 mLs by nebulization 2 (two) times daily.  Marland Kitchen levothyroxine (SYNTHROID) 100 MCG tablet TAKE 1 TABLET BY MOUTH EVERY DAY BEFORE BREAKFAST  . loperamide (IMODIUM A-D) 2 MG tablet Take 1 tablet (2 mg total) by mouth in the morning and at bedtime.  Marland Kitchen loratadine (CLARITIN) 10 MG tablet Take 10 mg by mouth daily.  Marland Kitchen omeprazole (PRILOSEC) 40 MG capsule TAKE ONE CAPSULE BY MOUTH DAILY BEFORE BREAKFAST and TAKE ONE CAPSULE DAILY BEFORE DINNER  . ondansetron (ZOFRAN) 4 MG tablet Take 1 tablet (4 mg total) by mouth as needed for nausea.  . OXYGEN Inhale 2 L into the lungs at bedtime.   . potassium chloride (KLOR-CON) 10 MEQ tablet TAKE 1 TABLET BY MOUTH AT BEDTIME  . SYMBICORT 160-4.5 MCG/ACT inhaler INHALE TWO PUFFS BY MOUTH TWICE DAILY (Patient not taking: No sig reported)  . Tiotropium Bromide Monohydrate (SPIRIVA RESPIMAT) 2.5 MCG/ACT AERS Inhale 2 puffs into the lungs daily. (Patient not taking: No sig reported)  . triamterene-hydrochlorothiazide (MAXZIDE-25) 37.5-25 MG tablet TAKE 1 TABLET BY MOUTH EVERY DAY  . venlafaxine XR (EFFEXOR XR) 75 MG 24 hr capsule 225 mg daily. Take along with 150 mg tab (Patient taking differently: Take by mouth daily. 150 mg daily)   No facility-administered encounter medications on file as of 03/28/2020.    Current Diagnosis: Patient Active Problem List   Diagnosis Date Noted  . Diabetes mellitus, new onset ([place])  04/12/2019  . Tremor 11/05/2018  . Chronic respiratory failure with hypoxia ([place]) 01/12/2018  . GERD with esophagitis 11/26/2017  . Dysphagia 08/20/2017  . Chronic pain syndrome 08/04/2017  . Comorbid sleep-related hypoventilation 08/04/2017  . OSA (obstructive sleep apnea) 08/04/2017  . Hepatic steatosis 08/04/2017  . Polyp of ascending colon   . Diverticulosis of colon without hemorrhage   . Gastritis and gastroduodenitis   . Hypothyroidism 07/22/2017  . Anxiety and depression 07/22/2017  . Iron deficiency anemia  07/22/2017  . Pilar cyst 07/22/2017  . Morbid obesity due to excess calories ([place]) 03/19/2017  . Anemia 03/19/2017  . COPD GOLD II still smoking  03/18/2017  . Degenerative lumbar spinal stenosis 02/17/2017  . S/P right hip revision 09/02/2016  . S/P hip replacement 09/02/2016  . Instability of prosthetic hip ([place]) 03/01/2016  . Dislocation of hip prosthesis ([place]) 02/17/2016  . Abnormal nuclear stress test: Intermediate Risk 10/31/2015  . Atypical angina ([place]) 10/31/2015  . Trigger finger, acquired 08/20/2012  . Cigarette smoker 12/12/2008  . Hyperlipidemia 11/17/2008  . HYPERTENSION, BENIGN 11/16/2008  . GEN OSTEOARTHROSIS INVOLVING MULTIPLE SITES 11/16/2008    Recent Relevant Labs: Lab Results  Component Value Date/Time   HGBA1C 6.5 03/01/2020 01:40 PM   HGBA1C 6.6 (H) 11/30/2019 04:09 PM   MICROALBUR 0.4 03/12/2019 01:54 PM    Kidney Function Lab Results  Component Value Date/Time   CREATININE 0.96 11/30/2019 04:09 PM   CREATININE 0.89 08/12/2019 11:58 AM   CREATININE 1.19 (H) 03/12/2019 01:54 PM   CREATININE 1.0 06/30/2014 02:48 PM   CREATININE 1.1 12/29/2013 01:05 PM   GFR 62.82 11/30/2019 04:09 PM   GFRNONAA 59 (L) 07/07/2019 02:04 PM   GFRAA >60 07/07/2019 02:04 PM    . Current antihyperglycemic regimen:  o Patient does not take any hyperglycemic medications at this time. Patient states prior PCP Elyn Aquas, [place] stated she didn't need to take any diabetic medication as long as her sugars remain in between 80-120.  Marland Kitchen What recent interventions/DTPs have been made to improve glycemic control:  o None noted - patient states she has lost 65 lbs since being diagnosed with diabetes.  . Have there been any recent hospitalizations or ED visits since last visit with CPP? No, patient has not had any recent hospitalizations or ED visits.   . Patient denies hypoglycemic symptoms, including Pale, Sweaty, Shaky, Hungry, Nervous/irritable and Vision changes   . Patient denies  hyperglycemic symptoms, including blurry vision, excessive thirst, fatigue, polyuria and weakness   . How often are you checking your blood sugar? once daily   . What are your blood sugars ranging?  o Fasting: 109-193 o Before meals: n/a o After meals: n/a o Bedtime: n/a  . During the week, how often does your blood glucose drop below 70? once every few months   . Are you checking your feet daily/regularly? Yes, patient states she checks her feet regularly. Patient states she has cold feet and wears multiple layers of socks at times.  Adherence Review: Is the patient currently on a STATIN medication? Yes Is the patient currently on ACE/ARB medication? No Does the patient have >5 day gap between last estimated fill dates? No   Patient expresses concerns of finding a new PCP that "meets her needs". As her prior PCP has left her practice.   Patient states she is having a hard time finding a provider that is comfortable with prescribing both xanax and narcotic pain medications together. Patient states she has been  on this combination of medications since the 90's.  Patient states she recently went to see her ophthalmologist and found out she has cataracts. Patient states she was given a new prescription for eyeglasses.  Future Appointments  Date Time Provider [place]  06/20/2020  3:30 PM Margaretha Seeds, MD [place] None    April D Calhoun, [place] Pharmacist Assistant [phone]   Follow-Up:  Pharmacist Review

## 2020-03-29 DIAGNOSIS — J449 Chronic obstructive pulmonary disease, unspecified: Secondary | ICD-10-CM | POA: Diagnosis not present

## 2020-03-31 ENCOUNTER — Encounter: Payer: Self-pay | Admitting: Physical Medicine and Rehabilitation

## 2020-03-31 ENCOUNTER — Telehealth: Payer: Self-pay

## 2020-03-31 NOTE — Telephone Encounter (Signed)
Patient contacted me this morning and she wanted to know if [facility] would consider taking her back as a patient. [name] states that she went to [facility] and did not like their team and would like to come back to our practice. Patient did state that she has a broken arm currently and is wanting to follow up with [facility] about it, patient is scheduled for Monday.

## 2020-03-31 NOTE — Telephone Encounter (Signed)
When did she break her arm? Where was she seen for her arm? I would try to get her in with Dr. Georgina Snell with sports medicine about this as this is his specialty....  I am concerned it appears she is doctor shopping based on the last few telephone notes. She knows where I stand about her medications and my preferences regarding those. I'm not sure I'm the best fit for her, but am willing to try it if there are no other options that work. She did originally transfer from [facility] this is closer for her, where we will have two new providers starting. Tammy, do you have any input here?

## 2020-03-31 NOTE — Telephone Encounter (Signed)
I contacted [name] to address the questions below. [name] stated she broke her elbow in the past from a fall caused by a dislocated hip, she says this is what happened again and when I asked patient if she has seen her orthopedic for the issue she said no, as she knows there is nothing they can do for it other than to splint it in place. She has an issue with dislocation of her hip which sometimes causes her problems, [name] just wants reassurance with Alyssa that it is in fact broken and to come back to [facility].

## 2020-04-03 ENCOUNTER — Encounter: Payer: Self-pay | Admitting: Physician Assistant

## 2020-04-03 ENCOUNTER — Ambulatory Visit (INDEPENDENT_AMBULATORY_CARE_PROVIDER_SITE_OTHER): Payer: Medicare HMO

## 2020-04-03 ENCOUNTER — Other Ambulatory Visit: Payer: Self-pay | Admitting: *Deleted

## 2020-04-03 ENCOUNTER — Ambulatory Visit (INDEPENDENT_AMBULATORY_CARE_PROVIDER_SITE_OTHER): Payer: Medicare HMO | Admitting: Physician Assistant

## 2020-04-03 ENCOUNTER — Other Ambulatory Visit: Payer: Self-pay

## 2020-04-03 VITALS — BP 142/85 | HR 87 | Temp 98.0°F | Ht 62.5 in | Wt 212.0 lb

## 2020-04-03 DIAGNOSIS — F1190 Opioid use, unspecified, uncomplicated: Secondary | ICD-10-CM | POA: Diagnosis not present

## 2020-04-03 DIAGNOSIS — G894 Chronic pain syndrome: Secondary | ICD-10-CM

## 2020-04-03 DIAGNOSIS — F32A Depression, unspecified: Secondary | ICD-10-CM

## 2020-04-03 DIAGNOSIS — R69 Illness, unspecified: Secondary | ICD-10-CM | POA: Diagnosis not present

## 2020-04-03 DIAGNOSIS — M25521 Pain in right elbow: Secondary | ICD-10-CM

## 2020-04-03 DIAGNOSIS — F17210 Nicotine dependence, cigarettes, uncomplicated: Secondary | ICD-10-CM

## 2020-04-03 DIAGNOSIS — Z87891 Personal history of nicotine dependence: Secondary | ICD-10-CM

## 2020-04-03 DIAGNOSIS — F419 Anxiety disorder, unspecified: Secondary | ICD-10-CM

## 2020-04-03 DIAGNOSIS — S52121A Displaced fracture of head of right radius, initial encounter for closed fracture: Secondary | ICD-10-CM | POA: Diagnosis not present

## 2020-04-03 NOTE — Progress Notes (Signed)
Established Patient Office Visit  Subjective:  Patient ID: [name], female    DOB: 12-22-1955  Age: 65 y.o. MRN: [id number]  CC:  Chief Complaint  Patient presents with  . Follow-up    HPI [name] presents for R elbow pain and medication discussion.  Right elbow pain: Patient states that she fell approximately 2 weeks ago after her right hip gave out, which is not an unusual occurrence for her she says.  She is not sure how she landed on her elbow, but has been able to use it with some pain in the last few weeks.  She states that she has hurt this elbow before and it does not feel any different than previously so she was not really concerned about it and did not seek urgent care or ER treatment when she fell.  She did not hit her head or lose any consciousness.  She is right-handed and she has still been able to open water bottles and carry objects without any difficulty.  Patient also came in to discuss her medications that I expressed concerned about at our last (first meeting).  There are several phone notes referenced to this in her chart.  She expressed to me that she would like to keep me as a provider and help her work through these issues.  She does have a pain management first appointment coming up on April 8. She went to [facility] and had a 7-day supply filled of her Norco on 03-25-20.  She still has 5 pills left with her today and says she has been trying to only take them as needed.  Today most of her pain is in her low back, knees, lower abdomen, and R elbow, all of which she rates 8/10.  As far as her alprazolam 1 mg QID dosing that she has had previously, she tells me today that she has been trying to take this medication only 2-3 times daily instead of the 4 times daily.  She is next due for a refill of this on 04-11-20.  She has #30 left in her bottle with her today.  I asked her further about her alprazolam use.  She says she has been on this medication  since the 90s.  Her daughter overdosed in 2020 about 2 years ago and patient says that her alprazolam dosage has only increased since then as she continues to grieve this loss.  She also tells me today that she has stopped her Effexor and BuSpar daily because she was feeling "emotionless" and she has been crying more often, but she does not feel like this is a problem because she wanted to feel her emotions again.  She denies any suicidal ideations her or other concerning thoughts at this time.  The only time she tries to use the alprazolam she tells me is when she starts to feel shaky.  Past Medical History:  Diagnosis Date  . Allergic rhinitis   . Anemia   . Anxiety   . Chicken pox   . Chronic back pain   . COPD (chronic obstructive pulmonary disease) ([place])   . Depression   . DM (diabetes mellitus) ([place])   . Essential hypertension   . GERD (gastroesophageal reflux disease)   . Headache    migraines  . History of gastritis    EGD 2015  . History of home oxygen therapy    2 liters at hs last 6 months  . Hyperlipidemia   .  Hypothyroidism   . Migraines   . Osteoarthritis    oa  . Scoliosis     Past Surgical History:  Procedure Laterality Date  . APPENDECTOMY     1985  . BIOPSY  07/24/2017   Procedure: BIOPSY;  Surgeon: Milus Banister, MD;  Location: [facility];  Service: Endoscopy;;  . CARDIAC CATHETERIZATION N/A 10/31/2015   Procedure: Left Heart Cath and Coronary Angiography;  Surgeon: Leonie Man, MD;  Location: [facility];  Service: Cardiovascular;  Laterality: N/A;  . CARPAL TUNNEL RELEASE Left   . CARPAL TUNNEL RELEASE Right   . CHOLECYSTECTOMY  late 1980's  . COLONOSCOPY WITH PROPOFOL N/A 07/24/2017   Procedure: COLONOSCOPY WITH PROPOFOL;  Surgeon: Milus Banister, MD;  Location: [facility];  Service: Endoscopy;  Laterality: N/A;  . ESOPHAGOGASTRODUODENOSCOPY N/A 07/24/2017   Procedure: ESOPHAGOGASTRODUODENOSCOPY (EGD);  Surgeon: Milus Banister, MD;   Location: [facility];  Service: Endoscopy;  Laterality: N/A;  . GALLBLADDER SURGERY  1991  . HIP CLOSED REDUCTION Right 01/08/2016   Procedure: CLOSED MANIPULATION HIP;  Surgeon: Susa Day, MD;  Location: [facility];  Service: Orthopedics;  Laterality: Right;  . HIP CLOSED REDUCTION Right 01/19/2016   Procedure: ATTEMPTED CLOSED REDUCTION RIGHT HIP;  Surgeon: Wylene Simmer, MD;  Location: [facility];  Service: Orthopedics;  Laterality: Right;  . HIP CLOSED REDUCTION Right 01/20/2016   Procedure: CLOSED REDUCTION RIGHT TOTAL HIP;  Surgeon: Paralee Cancel, MD;  Location: [facility];  Service: Orthopedics;  Laterality: Right;  . HIP CLOSED REDUCTION Right 02/17/2016   Procedure: CLOSED REDUCTION RIGHT TOTAL HIP;  Surgeon: Rod Can, MD;  Location: [facility];  Service: Orthopedics;  Laterality: Right;  . HIP CLOSED REDUCTION Right 02/28/2016   Procedure: CLOSED REDUCTION HIP;  Surgeon: Nicholes Stairs, MD;  Location: [facility];  Service: Orthopedics;  Laterality: Right;  . POLYPECTOMY  07/24/2017   Procedure: POLYPECTOMY;  Surgeon: Milus Banister, MD;  Location: [facility];  Service: Endoscopy;;  . TONSILLECTOMY    . TOTAL ABDOMINAL HYSTERECTOMY     1985, with 1 ovary removed and 2 nd ovary removed 2003  . TOTAL HIP ARTHROPLASTY Right    Original surgery 2006 with revision 2010  . TOTAL HIP REVISION Right 01/01/2016   Procedure: TOTAL HIP REVISION;  Surgeon: Paralee Cancel, MD;  Location: [facility];  Service: Orthopedics;  Laterality: Right;  . TOTAL HIP REVISION Right 03/02/2016   Procedure: TOTAL HIP REVISION;  Surgeon: Paralee Cancel, MD;  Location: [facility];  Service: Orthopedics;  Laterality: Right;  . TOTAL HIP REVISION Right 09/02/2016   Procedure: Right hip constrained liner- posterior;  Surgeon: Paralee Cancel, MD;  Location: [facility];  Service: Orthopedics;  Laterality: Right;  . ULNAR NERVE TRANSPOSITION Right     Family History  Problem Relation Age of Onset  . COPD Mother   . Heart disease Mother    . Lung disease Father        Asbestosis  . Heart attack Father   . Heart disease Father   . Cerebral aneurysm Brother   . Aneurysm Brother        Brain  . Epilepsy Son   . Alcohol abuse Son   . Arthritis Maternal Grandmother   . Heart disease Maternal Grandmother   . Asthma Maternal Grandfather   . Cancer Maternal Grandfather   . Arthritis Paternal Grandmother   . Heart disease Paternal Grandmother   . Stroke Paternal Grandmother   . Early death Paternal  Grandfather   . Heart disease Paternal Grandfather     Social History   Socioeconomic History  . Marital status: Married    Spouse name: Not on file  . Number of children: Not on file  . Years of education: Not on file  . Highest education level: Not on file  Occupational History  . Not on file  Tobacco Use  . Smoking status: Current Every Day Smoker    Packs/day: 1.50    Years: 46.00    Pack years: 69.00    Types: Cigarettes  . Smokeless tobacco: Never Used  Vaping Use  . Vaping Use: Never used  Substance and Sexual Activity  . Alcohol use: No  . Drug use: No  . Sexual activity: Yes  Other Topics Concern  . Not on file  Social History Narrative   Right handed    Caffeine~ 2 cups per day    Lives at home with husband (strained relationship)   Primary caretaker for disabled brother who had aneurism   Daughter died 06-20-18    Social Determinants of Health   Financial Resource Strain: Low Risk   . Difficulty of Paying Living Expenses: Not hard at all  Food Insecurity: No Food Insecurity  . Worried About Charity fundraiser in the Last Year: Never true  . Ran Out of Food in the Last Year: Never true  Transportation Needs: No Transportation Needs  . Lack of Transportation (Medical): No  . Lack of Transportation (Non-Medical): No  Physical Activity: Inactive  . Days of Exercise per Week: 0 days  . Minutes of Exercise per Session: 0 min  Stress: Stress Concern Present  . Feeling of Stress : To some extent   Social Connections: Moderately Integrated  . Frequency of Communication with Friends and Family: More than three times a week  . Frequency of Social Gatherings with Friends and Family: Once a week  . Attends Religious Services: 1 to 4 times per year  . Active Member of Clubs or Organizations: No  . Attends Archivist Meetings: Never  . Marital Status: Married  Human resources officer Violence: Not At Risk  . Fear of Current or Ex-Partner: No  . Emotionally Abused: No  . Physically Abused: No  . Sexually Abused: No    Outpatient Medications Prior to Visit  Medication Sig Dispense Refill  . Accu-Chek Softclix Lancets lancets 4 (four) times daily.    Marland Kitchen albuterol (PROAIR HFA) 108 (90 Base) MCG/ACT inhaler 2 puffs every 4 hours as needed only  if your can't catch your breath (Patient taking differently: Inhale 2 puffs into the lungs every 4 (four) hours as needed for wheezing or shortness of breath. if your can't catch your breath) 6.7 g 0  . ALPRAZolam (XANAX) 1 MG tablet TAKE 1 TABLET BY MOUTH EVERY MORNING, TAKE 1 TABLET AT NOON, TAKE 1 TABLET EVERY EVENING, AND TAKE 1 TABLET AT BEDTIME (Patient taking differently: 2 (two) times daily as needed. Patient taking 1-2 tablets daily) 120 tablet 0  . Aspirin-Acetaminophen-Caffeine (GOODY HEADACHE PO) Take 1 tablet by mouth as needed. As needed    . atorvastatin (LIPITOR) 20 MG tablet TAKE 1 TABLET BY MOUTH AT BEDTIME 30 tablet 1  . benzonatate (TESSALON) 100 MG capsule Take 1 capsule (100 mg total) by mouth 3 (three) times daily as needed for cough. 30 capsule 0  . fluticasone (FLONASE) 50 MCG/ACT nasal spray USE TWO SPRAYS IN EACH NOSTRIL EVERY DAY 16 g 6  .  furosemide (LASIX) 20 MG tablet TAKE 1 TABLET BY MOUTH EVERY DAY AS NEEDED 30 tablet 90  . glucose blood (ACCU-CHEK GUIDE) test strip 1 each by Other route 3 (three) times daily as needed for other. as directed Dx E11.9 100 strip 11  . HYDROcodone-acetaminophen (NORCO) 10-325 MG tablet  Take 1 tablet by mouth every 8 (eight) hours as needed. 90 tablet 0  . ipratropium-albuterol (DUONEB) 0.5-2.5 (3) MG/3ML SOLN Take 3 mLs by nebulization 2 (two) times daily. 360 mL 2  . levothyroxine (SYNTHROID) 100 MCG tablet TAKE 1 TABLET BY MOUTH EVERY DAY BEFORE BREAKFAST 90 tablet 1  . loperamide (IMODIUM A-D) 2 MG tablet Take 1 tablet (2 mg total) by mouth in the morning and at bedtime. 60 tablet 3  . loratadine (CLARITIN) 10 MG tablet Take 10 mg by mouth daily.    Marland Kitchen omeprazole (PRILOSEC) 40 MG capsule TAKE ONE CAPSULE BY MOUTH DAILY BEFORE BREAKFAST and TAKE ONE CAPSULE DAILY BEFORE DINNER 60 capsule 2  . ondansetron (ZOFRAN) 4 MG tablet Take 1 tablet (4 mg total) by mouth as needed for nausea. 50 tablet 6  . OXYGEN Inhale 2 L into the lungs at bedtime.     . potassium chloride (KLOR-CON) 10 MEQ tablet TAKE 1 TABLET BY MOUTH AT BEDTIME 90 tablet 0  . Tiotropium Bromide Monohydrate (SPIRIVA RESPIMAT) 2.5 MCG/ACT AERS Inhale 2 puffs into the lungs daily. 12 g 0  . triamterene-hydrochlorothiazide (MAXZIDE-25) 37.5-25 MG tablet TAKE 1 TABLET BY MOUTH EVERY DAY 90 tablet 1  . busPIRone (BUSPAR) 7.5 MG tablet TAKE 1 TABLET BY MOUTH TWICE DAILY 60 tablet 1  . SYMBICORT 160-4.5 MCG/ACT inhaler INHALE TWO PUFFS BY MOUTH TWICE DAILY 10.2 g 5  . venlafaxine XR (EFFEXOR XR) 75 MG 24 hr capsule 225 mg daily. Take along with 150 mg tab (Patient taking differently: Take by mouth daily. 150 mg daily) 30 capsule 1   No facility-administered medications prior to visit.    Allergies  Allergen Reactions  . Nsaids Diarrhea  . Aleve [Naproxen Sodium] Other (See Comments)    Headache   . Codeine Nausea Only and Other (See Comments)    GI upset  . Penicillins Nausea Only and Other (See Comments)    GI upset   . Sulfonamide Derivatives Hives    ROS Review of Systems  Constitutional: Negative for activity change and unexpected weight change.  Respiratory: Negative for wheezing.   Cardiovascular:  Negative for chest pain.  Gastrointestinal: Positive for abdominal pain. Negative for blood in stool and constipation.  Musculoskeletal: Positive for arthralgias and back pain.  Neurological: Negative for dizziness.  Psychiatric/Behavioral: Positive for dysphoric mood and sleep disturbance. The patient is nervous/anxious.       Objective:    Physical Exam Vitals and nursing note reviewed.  Constitutional:      Appearance: Normal appearance. She is obese. She is not toxic-appearing.  HENT:     Head: Normocephalic and atraumatic.     Right Ear: External ear normal.     Left Ear: External ear normal.     Nose: Nose normal.     Mouth/Throat:     Mouth: Mucous membranes are moist.  Eyes:     Extraocular Movements: Extraocular movements intact.     Conjunctiva/sclera: Conjunctivae normal.     Pupils: Pupils are equal, round, and reactive to light.  Cardiovascular:     Rate and Rhythm: Normal rate and regular rhythm.     Pulses: Normal pulses.  Heart sounds: Normal heart sounds.  Pulmonary:     Effort: Pulmonary effort is normal.     Breath sounds: Wheezing present.  Abdominal:     General: Abdomen is flat. Bowel sounds are normal.     Palpations: Abdomen is soft.  Musculoskeletal:        General: Normal range of motion.     Cervical back: Normal range of motion and neck supple.  Skin:    General: Skin is warm and dry.  Neurological:     General: No focal deficit present.     Mental Status: She is alert and oriented to person, place, and time.  Psychiatric:        Mood and Affect: Mood is depressed.        Behavior: Behavior normal.        Thought Content: Thought content normal.        Judgment: Judgment normal.     BP (!) 142/85   Pulse 87   Temp 98 F (36.7 C)   Ht 5' 2.5" (1.588 m)   Wt 212 lb (96.2 kg)   SpO2 96%   BMI 38.16 kg/m  Wt Readings from Last 3 Encounters:  04/03/20 212 lb (96.2 kg)  03/21/20 217 lb 6.4 oz (98.6 kg)  03/20/20 217 lb 6.1 oz  (98.6 kg)     Health Maintenance Due  Topic Date Due  . Hepatitis C Screening  Never done  . FOOT EXAM  Never done  . OPHTHALMOLOGY EXAM  Never done  . HIV Screening  Never done  . MAMMOGRAM  05/28/2019  . URINE MICROALBUMIN  03/11/2020    There are no preventive care reminders to display for this patient.  Lab Results  Component Value Date   TSH 4.39 11/04/2019   Lab Results  Component Value Date   WBC 9.9 03/01/2020   HGB 13.4 03/01/2020   HCT 40.4 03/01/2020   MCV 84.6 03/01/2020   PLT 190.0 03/01/2020   Lab Results  Component Value Date   NA 134 (L) 11/30/2019   K 3.9 11/30/2019   CHLORIDE 110 (H) 06/30/2014   CO2 28 11/30/2019   GLUCOSE 76 11/30/2019   BUN 12 11/30/2019   CREATININE 0.96 11/30/2019   BILITOT 0.5 11/30/2019   ALKPHOS 92 11/30/2019   AST 23 11/30/2019   ALT 21 11/30/2019   PROT 6.9 11/30/2019   ALBUMIN 3.9 11/30/2019   CALCIUM 9.0 11/30/2019   ANIONGAP 8 07/07/2019   EGFR 60 (L) 06/30/2014   GFR 62.82 11/30/2019   Lab Results  Component Value Date   CHOL 98 11/30/2019   Lab Results  Component Value Date   HDL 31.90 (L) 11/30/2019   Lab Results  Component Value Date   LDLCALC 27 11/30/2019   Lab Results  Component Value Date   TRIG 193.0 (H) 11/30/2019   Lab Results  Component Value Date   CHOLHDL 3 11/30/2019   Lab Results  Component Value Date   HGBA1C 6.5 03/01/2020      Assessment & Plan:   Problem List Items Addressed This Visit      Other   Anxiety and depression   Chronic pain syndrome   Relevant Orders   Drugs of abuse scrn w alc, routine urine    Other Visit Diagnoses    Right elbow pain    -  Primary   Relevant Orders   DG Elbow Complete Right   Chronic narcotic use  Relevant Orders   Drugs of abuse scrn w alc, routine urine      No orders of the defined types were placed in this encounter.   Follow-up: No follow-ups on file.   1. Right elbow pain She has full functioning of her  elbow.  She does have some signs of pain with flexion, otherwise I see no abnormalities on exam today.  I will do an x-ray just to ensure there are no new fractures.  I suspect she has a moderate degree of arthritis in this joint.  2. Chronic pain syndrome 3. Chronic narcotic use Long discussion with patient again today about her chronic pain and use of Norco 10-325 mg TID.  She does have an appointment with Courtney Heys with pain management on May 05, 2020 at 1 PM.  Patient is expected to keep this appointment.  She is aware that I will not prescribe this medication over long-term use.  I did do a urine drug screening in the office today and pending there are no unexpected findings or red flags, I will fill her medication for 30 days to get her to that appointment. PMPD was referenced, as well as a phone conversation with her pharmacist at [facility], and no discrepancies were found. Her last dose of this medication was first thing this morning.  4. Anxiety and depression Again discussed my concern with patient today about the misuse of the alprazolam (plus the combination with Norco).  Informed her that this medication is not and should not have ever been intended for long-term use to treat her symptoms.  Side effects discussed with her and risk of long-term use.  As she has been taking this for years, a very slow taper is recommended.  I also discussed with her pharmacist at the [facility] drug about this and she is agreeable that a slow reduction in this medication will be best.  Patient tells me today that she has only been taking the alprazolam 1 mg twice daily and has been doing well.  She denies any problems or side effects thus far.  She is next due for refill on 04-11-20.  She signed a Soil scientist with me today and as she is continuing to do well, I will fill at that time for 30 days with alprazolam 1 mg twice daily dosing, again, pending no surprises on urine drug screening. I anticipate a taper off this  medication completely within the next 6 months. I will see her back in 4 weeks to reassess how she is doing.  I still strongly recommend that she see a psychiatrist.  She will also need to be on a different SSRI or SNRI long-term and again we will need to discuss this further.  I am concerned that she stopped the Effexor and Buspar on her own. Strongly advised consultation with myself or specialist prior to stopping any medications on her own, as there can be major risks and withdrawal symptoms that may occur. She assures me that she does not have any suicidal ideations and she is doing OK, but she is mostly concerned about her pain.   This visit occurred during the SARS-CoV-2 public health emergency.  Safety protocols were in place, including screening questions prior to the visit, additional usage of staff PPE, and extensive cleaning of exam room while observing appropriate contact time as indicated for disinfecting solutions.    Olin Gurski M Sulamita [place]ountain, PA-C

## 2020-04-03 NOTE — Patient Instructions (Addendum)
Good to see you again today.  Please go to the lab for XR of your Right elbow today and I will call or message with results.  We will also check a urine drug screen today at the lab.  Appointment with pain management (Megan Lovorn) is on 05/05/20 at 1 pm. I will fill the Norco for 30 days pending urine screen is as expected.  I will see you back in 4 weeks to see how you are doing. Please bring all medications with you to this appointment.

## 2020-04-04 ENCOUNTER — Encounter: Payer: Self-pay | Admitting: Physician Assistant

## 2020-04-05 ENCOUNTER — Telehealth: Payer: Self-pay

## 2020-04-05 NOTE — Telephone Encounter (Signed)
I just had our lab team check again and results are still not back. Please advise warm compresses to eyes four times daily for 10 minutes and she may try [place] drops OTC.

## 2020-04-05 NOTE — Telephone Encounter (Signed)
Patient stated her eye has been hurting for 1 week and is now having discharge,wanting a Rx. Informed her we will send lab results once received.

## 2020-04-05 NOTE — Telephone Encounter (Signed)
Pt is requesting a call in regards to her eye and her drug test.

## 2020-04-06 ENCOUNTER — Other Ambulatory Visit: Payer: Self-pay

## 2020-04-06 DIAGNOSIS — M25521 Pain in right elbow: Secondary | ICD-10-CM

## 2020-04-06 NOTE — Telephone Encounter (Signed)
Patient notified and voices understanding

## 2020-04-06 NOTE — Chronic Care Management (AMB) (Signed)
    Chronic Care Management Pharmacy Assistant   Name: ADITRI LOUISCHARLES  MRN: [id number] DOB: 1955/08/22  Reason for Encounter: Patient Assistance Documentation    Medications: Outpatient Encounter Medications as of 03/27/2020  Medication Sig  . Accu-Chek Softclix Lancets lancets 4 (four) times daily.  Marland Kitchen albuterol (PROAIR HFA) 108 (90 Base) MCG/ACT inhaler 2 puffs every 4 hours as needed only  if your can't catch your breath (Patient taking differently: Inhale 2 puffs into the lungs every 4 (four) hours as needed for wheezing or shortness of breath. if your can't catch your breath)  . ALPRAZolam (XANAX) 1 MG tablet TAKE 1 TABLET BY MOUTH EVERY MORNING, TAKE 1 TABLET AT NOON, TAKE 1 TABLET EVERY EVENING, AND TAKE 1 TABLET AT BEDTIME (Patient taking differently: 2 (two) times daily as needed. Patient taking 1-2 tablets daily)  . Aspirin-Acetaminophen-Caffeine (GOODY HEADACHE PO) Take 1 tablet by mouth as needed. As needed  . atorvastatin (LIPITOR) 20 MG tablet TAKE 1 TABLET BY MOUTH AT BEDTIME  . benzonatate (TESSALON) 100 MG capsule Take 1 capsule (100 mg total) by mouth 3 (three) times daily as needed for cough.  . fluticasone (FLONASE) 50 MCG/ACT nasal spray USE TWO SPRAYS IN EACH NOSTRIL EVERY DAY  . furosemide (LASIX) 20 MG tablet TAKE 1 TABLET BY MOUTH EVERY DAY AS NEEDED  . glucose blood (ACCU-CHEK GUIDE) test strip 1 each by Other route 3 (three) times daily as needed for other. as directed Dx E11.9  . HYDROcodone-acetaminophen (NORCO) 10-325 MG tablet Take 1 tablet by mouth every 8 (eight) hours as needed.  Marland Kitchen ipratropium-albuterol (DUONEB) 0.5-2.5 (3) MG/3ML SOLN Take 3 mLs by nebulization 2 (two) times daily.  Marland Kitchen levothyroxine (SYNTHROID) 100 MCG tablet TAKE 1 TABLET BY MOUTH EVERY DAY BEFORE BREAKFAST  . loperamide (IMODIUM A-D) 2 MG tablet Take 1 tablet (2 mg total) by mouth in the morning and at bedtime.  Marland Kitchen loratadine (CLARITIN) 10 MG tablet Take 10 mg by mouth daily.  Marland Kitchen omeprazole  (PRILOSEC) 40 MG capsule TAKE ONE CAPSULE BY MOUTH DAILY BEFORE BREAKFAST and TAKE ONE CAPSULE DAILY BEFORE DINNER  . ondansetron (ZOFRAN) 4 MG tablet Take 1 tablet (4 mg total) by mouth as needed for nausea.  . OXYGEN Inhale 2 L into the lungs at bedtime.   . potassium chloride (KLOR-CON) 10 MEQ tablet TAKE 1 TABLET BY MOUTH AT BEDTIME  . Tiotropium Bromide Monohydrate (SPIRIVA RESPIMAT) 2.5 MCG/ACT AERS Inhale 2 puffs into the lungs daily.  Marland Kitchen triamterene-hydrochlorothiazide (MAXZIDE-25) 37.5-25 MG tablet TAKE 1 TABLET BY MOUTH EVERY DAY  . [DISCONTINUED] busPIRone (BUSPAR) 7.5 MG tablet TAKE 1 TABLET BY MOUTH TWICE DAILY  . [DISCONTINUED] SYMBICORT 160-4.5 MCG/ACT inhaler INHALE TWO PUFFS BY MOUTH TWICE DAILY  . [DISCONTINUED] venlafaxine XR (EFFEXOR XR) 75 MG 24 hr capsule 225 mg daily. Take along with 150 mg tab (Patient taking differently: Take by mouth daily. 150 mg daily)   No facility-administered encounter medications on file as of 03/27/2020.   -I called to inquire about patient assistance applications for medications Spirivia and Symbicort. Both of these applications were not sent to programs.  -I spoke with the patient, she states she has misplaced her applications and wants them to be mailed out to her again.  April D Calhoun, [place] Pharmacist Assistant [phone]

## 2020-04-07 ENCOUNTER — Telehealth: Payer: Self-pay

## 2020-04-07 ENCOUNTER — Other Ambulatory Visit: Payer: Self-pay | Admitting: Physician Assistant

## 2020-04-07 DIAGNOSIS — G894 Chronic pain syndrome: Secondary | ICD-10-CM

## 2020-04-07 MED ORDER — HYDROCODONE-ACETAMINOPHEN 10-325 MG PO TABS: 1.0000 | ORAL_TABLET | Freq: Three times a day (TID) | ORAL | 0 refills | Status: DC | PRN

## 2020-04-07 NOTE — Telephone Encounter (Signed)
5 days worth of Rx sent to [facility] as lab is taking [facility] longer than expected to come back. This will be for acute pain relief of her fractured elbow.

## 2020-04-07 NOTE — Telephone Encounter (Signed)
Notified patient she voices understanding 

## 2020-04-07 NOTE — Telephone Encounter (Signed)
Patient called to ask about lab results and they are not back yet and she is concerned the weekend is coming up and she's in a lot of pain and would like [place] to send in some medication just for this weekend until lab results come back ,  [place], [place] Please advise patient if yes or no on home phone . Thank you

## 2020-04-09 LAB — URINE DRUGS OF ABUSE SCREEN W ALC, ROUTINE (REF LAB)
Amphetamines, Urine: NEGATIVE ng/mL
Barbiturate Quant, Ur: NEGATIVE ng/mL
Cannabinoid Quant, Ur: NEGATIVE ng/mL
Cocaine (Metab.): NEGATIVE ng/mL
Ethanol, Urine: NEGATIVE %
Methadone Screen, Urine: NEGATIVE ng/mL
PCP Quant, Ur: NEGATIVE ng/mL
Propoxyphene: NEGATIVE ng/mL

## 2020-04-09 LAB — DRUG PROFILE 799031
BENZODIAZEPINES: POSITIVE — AB
HYDROXYALPRAZOLAM: POSITIVE — AB
NORDIAZEPAM: NEGATIVE
OH-Alprazolam GC/MS Conf: 575 ng/mL
Oxazepam: NEGATIVE

## 2020-04-09 LAB — OPIATES CONFIRMATION, URINE: OPIATES: NEGATIVE

## 2020-04-10 ENCOUNTER — Other Ambulatory Visit: Payer: Self-pay

## 2020-04-10 ENCOUNTER — Encounter: Payer: Self-pay | Admitting: Physical Medicine and Rehabilitation

## 2020-04-10 ENCOUNTER — Encounter: Payer: Medicare HMO | Attending: Physical Medicine and Rehabilitation | Admitting: Physical Medicine and Rehabilitation

## 2020-04-10 VITALS — BP 118/79 | HR 87 | Temp 98.4°F | Ht 62.5 in | Wt 218.0 lb

## 2020-04-10 DIAGNOSIS — T84029S Dislocation of unspecified internal joint prosthesis, sequela: Secondary | ICD-10-CM | POA: Insufficient documentation

## 2020-04-10 DIAGNOSIS — Z96649 Presence of unspecified artificial hip joint: Secondary | ICD-10-CM | POA: Insufficient documentation

## 2020-04-10 DIAGNOSIS — Z96641 Presence of right artificial hip joint: Secondary | ICD-10-CM | POA: Diagnosis not present

## 2020-04-10 DIAGNOSIS — G894 Chronic pain syndrome: Secondary | ICD-10-CM | POA: Diagnosis not present

## 2020-04-10 DIAGNOSIS — M8949 Other hypertrophic osteoarthropathy, multiple sites: Secondary | ICD-10-CM | POA: Diagnosis not present

## 2020-04-10 DIAGNOSIS — M159 Polyosteoarthritis, unspecified: Secondary | ICD-10-CM

## 2020-04-10 MED ORDER — METHOCARBAMOL 500 MG PO TABS: 500.0000 mg | ORAL_TABLET | Freq: Three times a day (TID) | ORAL | 5 refills | Status: DC | PRN

## 2020-04-10 MED ORDER — HYDROCODONE-ACETAMINOPHEN 10-325 MG PO TABS: 1.0000 | ORAL_TABLET | Freq: Three times a day (TID) | ORAL | 0 refills | Status: DC | PRN

## 2020-04-10 MED ORDER — DULOXETINE HCL 30 MG PO CPEP: 30.0000 mg | ORAL_CAPSULE | Freq: Every day | ORAL | 3 refills | Status: DC

## 2020-04-10 MED ORDER — LEVOTHYROXINE SODIUM 150 MCG PO TABS: 150.0000 ug | ORAL_TABLET | Freq: Every day | ORAL | 5 refills | Status: DC

## 2020-04-10 NOTE — Patient Instructions (Signed)
Pt is a 65 yr old female with hx of newly dx'd COPD, still smoking, HTN, DM- diet controlled; anxiety and depression;  Scoliosis and deg back issues,  Generalized OA;  chronic pain- here for evaluation of chronic pain.    1. Call [facility]- ASAP about R elbow fx.   2. Increase Synthroid- increase Synthroid to 150 mcg- daily- since TSH was 4.83 last time- and really needs to be ~ 1 for optimal pain control and thyroid control.  3. Was taking Effexor- weaned off it- wasn't helpful.   4.  Duloxetine /Cymbalta 30 mg nightly x 1 week  Then 60 mg nightly- for nerve pain  1% of patients can have nausea with Duloxetine- call me if needs an anti-nausea medicine. Can also cause mild dry mouth/dry eyes and mild constipation.   5. Over the counter lidocaine  - can try for super sensivity of skin- will not help deep pain, but to help the superficial pain.   6. Robaxin/Methocarbemol- 500 mg up to 3x/day as needed- please don't take more than 4 DAYS/week. To reduce chance of it not working anymore down the road.   7. Might takes 2 pills/day of Xanax- - not more than 2 tabs/day.   8. Had UDS 1 week ago-  Was appropriate. Negative for illicit meds.   9. Needs opiate contract.   10. Norco/Hydrocodone 5/325 mg up to 3x/day # 90  11. Don't start changes on the same day- wait 1-2 days between making each med change.   12. F/U in 6-8 weeks.
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
## 2020-04-14 DIAGNOSIS — M25521 Pain in right elbow: Secondary | ICD-10-CM | POA: Diagnosis not present

## 2020-04-18 ENCOUNTER — Telehealth: Payer: Self-pay

## 2020-04-18 NOTE — Telephone Encounter (Signed)
Pt is changing pharmacy's [facility] [place].

## 2020-04-18 NOTE — Telephone Encounter (Signed)
updated

## 2020-04-19 ENCOUNTER — Encounter: Payer: Self-pay | Admitting: Physician Assistant

## 2020-04-20 ENCOUNTER — Other Ambulatory Visit: Payer: Self-pay | Admitting: Physician Assistant

## 2020-04-20 MED ORDER — ALPRAZOLAM 1 MG PO TABS: ORAL_TABLET | ORAL | 0 refills | Status: DC

## 2020-04-20 NOTE — Telephone Encounter (Signed)
Patient is following up on message below. Patient would like to know if medication can be sent in.

## 2020-04-21 ENCOUNTER — Telehealth: Payer: Self-pay

## 2020-04-21 NOTE — Telephone Encounter (Signed)
[facility] Dept called to let us know about pt.'s dissatisfaction with her visit on Feb 21. When pt came into the office pt felt like she did not receive proper care for the problem she came to be seen for. She states she was made to feel like an addict and states the provider seemed to care more about the medicine , then why the pt was there. Pt said the nurse that called in her prescription was a smart alec and wanted pt to go to therapy. Pt was unhappy with the experience she had that day and how she was treated. She states her concern wasn't addressed. This was reported to [facility] on Feb 23. [place] states they are just required to let us know that a grievance was filed.    [employer]- [id number]

## 2020-04-25 ENCOUNTER — Ambulatory Visit: Payer: Self-pay

## 2020-04-26 ENCOUNTER — Telehealth: Payer: Self-pay | Admitting: Gastroenterology

## 2020-04-26 ENCOUNTER — Other Ambulatory Visit: Payer: Self-pay | Admitting: Gastroenterology

## 2020-04-26 ENCOUNTER — Other Ambulatory Visit: Payer: Self-pay | Admitting: Physician Assistant

## 2020-04-26 DIAGNOSIS — R609 Edema, unspecified: Secondary | ICD-10-CM

## 2020-04-26 NOTE — Telephone Encounter (Signed)
Pharmacy already changed in [place].

## 2020-04-26 NOTE — Telephone Encounter (Signed)
Pt called to inform that she changed pharmacy. [facility] [place]

## 2020-04-29 DIAGNOSIS — J449 Chronic obstructive pulmonary disease, unspecified: Secondary | ICD-10-CM | POA: Diagnosis not present

## 2020-05-01 MED ORDER — OMEPRAZOLE 40 MG PO CPDR: DELAYED_RELEASE_CAPSULE | ORAL | 2 refills | Status: DC

## 2020-05-01 NOTE — Addendum Note (Signed)
Addended by: Dorisann Frames L on: 05/01/2020 12:02 PM   Modules accepted: Orders

## 2020-05-01 NOTE — Telephone Encounter (Signed)
Prescription sent to patient's pharmacy.

## 2020-05-01 NOTE — Telephone Encounter (Signed)
Patient called in about medication , [facility] [place]. She would like a call back at [phone]

## 2020-05-03 ENCOUNTER — Encounter: Payer: Self-pay | Admitting: Physician Assistant

## 2020-05-03 ENCOUNTER — Ambulatory Visit (INDEPENDENT_AMBULATORY_CARE_PROVIDER_SITE_OTHER): Payer: Medicare HMO | Admitting: Physician Assistant

## 2020-05-03 ENCOUNTER — Other Ambulatory Visit: Payer: Self-pay

## 2020-05-03 VITALS — BP 130/72 | HR 94 | Temp 97.2°F | Ht 62.5 in | Wt 217.2 lb

## 2020-05-03 DIAGNOSIS — R69 Illness, unspecified: Secondary | ICD-10-CM | POA: Diagnosis not present

## 2020-05-03 DIAGNOSIS — E1165 Type 2 diabetes mellitus with hyperglycemia: Secondary | ICD-10-CM

## 2020-05-03 DIAGNOSIS — G894 Chronic pain syndrome: Secondary | ICD-10-CM | POA: Diagnosis not present

## 2020-05-03 DIAGNOSIS — F419 Anxiety disorder, unspecified: Secondary | ICD-10-CM

## 2020-05-03 DIAGNOSIS — Z6839 Body mass index (BMI) 39.0-39.9, adult: Secondary | ICD-10-CM | POA: Diagnosis not present

## 2020-05-03 DIAGNOSIS — F32A Depression, unspecified: Secondary | ICD-10-CM

## 2020-05-03 MED ORDER — OZEMPIC (0.25 OR 0.5 MG/DOSE) 2 MG/1.5ML ~~LOC~~ SOPN: 0.2500 mg | PEN_INJECTOR | SUBCUTANEOUS | 0 refills | Status: DC
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
## 2020-05-03 NOTE — Patient Instructions (Addendum)
Good to see you again today.  For diabetes - it is too soon to check another HA1c, but I will check a CMP as this has not been done since November.  Please continue to track fasting sugars at home and bring log with you to next appointment if possible.  As you had severe diarrhea with metformin, I am going to try to get Ozempic approved, which will help with sugar control and weight loss.  *Time to make YOU a priority!! Write down your goals for the next 4 weeks for nutrition, exercise, and weight loss. This is going to help improve your moods as well.  Continue the Alprazolam at 1 mg twice daily only as needed for anxiety.   Call sooner if you have any issues.

## 2020-05-04 ENCOUNTER — Telehealth: Payer: Self-pay

## 2020-05-04 DIAGNOSIS — J449 Chronic obstructive pulmonary disease, unspecified: Secondary | ICD-10-CM | POA: Diagnosis not present

## 2020-05-04 LAB — COMPREHENSIVE METABOLIC PANEL
ALT: 28 U/L (ref 0–35)
AST: 27 U/L (ref 0–37)
Albumin: 3.8 g/dL (ref 3.5–5.2)
Alkaline Phosphatase: 102 U/L (ref 39–117)
BUN: 12 mg/dL (ref 6–23)
CO2: 30 mEq/L (ref 19–32)
Calcium: 8.9 mg/dL (ref 8.4–10.5)
Chloride: 101 mEq/L (ref 96–112)
Creatinine, Ser: 0.99 mg/dL (ref 0.40–1.20)
GFR: 60.36 mL/min (ref >60.00)
Glucose, Bld: 60 mg/dL — ABNORMAL LOW (ref 70–99)
Potassium: 4.1 mEq/L (ref 3.5–5.1)
Sodium: 138 mEq/L (ref 135–145)
Total Bilirubin: 0.5 mg/dL (ref 0.2–1.2)
Total Protein: 7 g/dL (ref 6.0–8.3)

## 2020-05-04 NOTE — Telephone Encounter (Signed)
Patient called back regarding lab results please call back on home phone

## 2020-05-04 NOTE — Telephone Encounter (Signed)
See result notes. 

## 2020-05-05 ENCOUNTER — Ambulatory Visit: Payer: Medicare HMO | Admitting: Physical Medicine and Rehabilitation

## 2020-05-09 DIAGNOSIS — G894 Chronic pain syndrome: Secondary | ICD-10-CM

## 2020-05-10 MED ORDER — HYDROCODONE-ACETAMINOPHEN 10-325 MG PO TABS: 1.0000 | ORAL_TABLET | Freq: Three times a day (TID) | ORAL | 0 refills | Status: DC | PRN

## 2020-05-15 ENCOUNTER — Telehealth: Payer: Self-pay

## 2020-05-15 ENCOUNTER — Other Ambulatory Visit: Payer: Self-pay

## 2020-05-15 ENCOUNTER — Encounter: Payer: Medicare HMO | Admitting: Acute Care

## 2020-05-15 ENCOUNTER — Ambulatory Visit (HOSPITAL_COMMUNITY): Payer: Medicare HMO

## 2020-05-15 ENCOUNTER — Telehealth: Payer: Self-pay | Admitting: Acute Care

## 2020-05-15 MED ORDER — ATORVASTATIN CALCIUM 20 MG PO TABS: 1.0000 | ORAL_TABLET | Freq: Every day | ORAL | 1 refills | Status: DC

## 2020-05-15 NOTE — Telephone Encounter (Signed)
..   LAST APPOINTMENT DATE: 05/04/2020   NEXT APPOINTMENT DATE:@5 /05/2020  MEDICATION:atorvastatin (LIPITOR) 20 MG tablet    PHARMACY:[id number] - [place], [place] - [address]    Please advise

## 2020-05-15 NOTE — Patient Instructions (Signed)
Thank you for participating in the [facility]. It was our pleasure to meet you today. We will call you with the results of your scan within the next few days. Your scan will be assigned a Lung RADS category score by the physicians reading the scans.  This Lung RADS score determines follow up scanning.  See below for description of categories, and follow up screening recommendations. We will be in touch to schedule your follow up screening annually or based on recommendations of our providers. We will fax a copy of your scan results to your Primary Care Physician, or the physician who referred you to the program, to ensure they have the results. Please call the office if you have any questions or concerns regarding your scanning experience or results.  Our office number is [phone]. Please speak with Denise Phelps, RN. She is our Lung Cancer Screening RN. If she is unavailable when you call, please have the office staff send her a message. She will return your call at her earliest convenience. Remember, if your scan is normal, we will scan you annually as long as you continue to meet the criteria for the program. (Age 55-77, Current smoker or smoker who has quit within the last 15 years). If you are a smoker, remember, quitting is the single most powerful action that you can take to decrease your risk of lung cancer and other pulmonary, breathing related problems. We know quitting is hard, and we are here to help.  Please let us know if there is anything we can do to help you meet your goal of quitting. If you are a former smoker, congratulations. We are proud of you! Remain smoke free! Remember you can refer friends or family members through the number above.  We will screen them to make sure they meet criteria for the program. Thank you for helping us take better care of you by participating in Lung Screening.  Lung RADS Categories:  Lung RADS 1: no nodules  or definitely non-concerning nodules.  Recommendation is for a repeat annual scan in 12 months.  Lung RADS 2:  nodules that are non-concerning in appearance and behavior with a very low likelihood of becoming an active cancer. Recommendation is for a repeat annual scan in 12 months.  Lung RADS 3: nodules that are probably non-concerning , includes nodules with a low likelihood of becoming an active cancer.  Recommendation is for a 6-month repeat screening scan. Often noted after an upper respiratory illness. We will be in touch to make sure you have no questions, and to schedule your 6-month scan.  Lung RADS 4 A: nodules with concerning findings, recommendation is most often for a follow up scan in 3 months or additional testing based on our provider's assessment of the scan. We will be in touch to make sure you have no questions and to schedule the recommended 3 month follow up scan.  Lung RADS 4 B:  indicates findings that are concerning. We will be in touch with you to schedule additional diagnostic testing based on our provider's  assessment of the scan.   

## 2020-05-15 NOTE — Progress Notes (Signed)
 SABRA

## 2020-05-15 NOTE — Telephone Encounter (Signed)
Information noted. If we receive any written information that requires a response then we will reply asap.

## 2020-05-15 NOTE — Telephone Encounter (Signed)
Rx sent in

## 2020-05-16 ENCOUNTER — Encounter: Payer: Self-pay | Admitting: Physician Assistant

## 2020-05-16 MED ORDER — ALPRAZOLAM 1 MG PO TABS: ORAL_TABLET | ORAL | 0 refills | Status: DC

## 2020-05-16 NOTE — Telephone Encounter (Signed)
[name], pt requesting refill for Alprazolam. Alyssa's pt can you fill?

## 2020-05-16 NOTE — Telephone Encounter (Signed)
Spoke with pt and rescheduled [facility] 06/14/20 12:00 CT will be rescheduled Nothing further needed

## 2020-05-17 ENCOUNTER — Ambulatory Visit: Payer: Medicare HMO | Attending: Critical Care Medicine

## 2020-05-17 ENCOUNTER — Other Ambulatory Visit: Payer: Self-pay

## 2020-05-17 DIAGNOSIS — Z23 Encounter for immunization: Secondary | ICD-10-CM

## 2020-05-17 NOTE — Progress Notes (Signed)
   Covid-19 Vaccination Clinic  Name:  [name]    MRN: [id number] DOB: 25-Dec-1955  05/17/2020  [name] was observed post Covid-19 immunization for 15 minutes without incident. She was provided with Vaccine Information Sheet and instruction to access the V-Safe system.   [name] was instructed to call 911 with any severe reactions post vaccine: Marland Kitchen Difficulty breathing  . Swelling of face and throat  . A fast heartbeat  . A bad rash all over body  . Dizziness and weakness   Immunizations Administered    Name Date Dose VIS Date Route   PFIZER Comrnaty(Gray TOP) Covid-19 Vaccine 05/17/2020 12:00 PM 0.3 mL 01/06/2020 Intramuscular   Manufacturer: [employer]   Lot: [id number]   NDC: [phone]

## 2020-05-19 ENCOUNTER — Encounter: Payer: Self-pay | Admitting: Physician Assistant

## 2020-05-19 MED ORDER — GLUCOSE BLOOD VI STRP: ORAL_STRIP | 11 refills | Status: DC

## 2020-05-24 ENCOUNTER — Encounter: Payer: Self-pay | Admitting: Physician Assistant

## 2020-05-24 ENCOUNTER — Other Ambulatory Visit: Payer: Self-pay

## 2020-05-24 DIAGNOSIS — R609 Edema, unspecified: Secondary | ICD-10-CM

## 2020-05-24 MED ORDER — POTASSIUM CHLORIDE CRYS ER 10 MEQ PO TBCR: 10.0000 meq | EXTENDED_RELEASE_TABLET | Freq: Every day | ORAL | 1 refills | Status: DC

## 2020-05-24 MED ORDER — TRIAMTERENE-HCTZ 37.5-25 MG PO TABS: 1.0000 | ORAL_TABLET | Freq: Every day | ORAL | 1 refills | Status: DC

## 2020-05-25 ENCOUNTER — Encounter: Payer: Self-pay | Admitting: Nurse Practitioner

## 2020-05-25 ENCOUNTER — Other Ambulatory Visit (INDEPENDENT_AMBULATORY_CARE_PROVIDER_SITE_OTHER): Payer: Medicare HMO

## 2020-05-25 ENCOUNTER — Ambulatory Visit: Payer: Medicare HMO | Admitting: Nurse Practitioner

## 2020-05-25 ENCOUNTER — Other Ambulatory Visit: Payer: Self-pay

## 2020-05-25 VITALS — BP 130/70 | HR 76 | Ht 62.5 in | Wt 215.0 lb

## 2020-05-25 DIAGNOSIS — R1310 Dysphagia, unspecified: Secondary | ICD-10-CM

## 2020-05-25 DIAGNOSIS — K921 Melena: Secondary | ICD-10-CM

## 2020-05-25 DIAGNOSIS — R1030 Lower abdominal pain, unspecified: Secondary | ICD-10-CM

## 2020-05-25 LAB — CBC WITH DIFFERENTIAL/PLATELET
Basophils Absolute: 0.1 10*3/uL (ref 0.0–0.1)
Basophils Relative: 1 % (ref 0.0–3.0)
Eosinophils Absolute: 0.3 10*3/uL (ref 0.0–0.7)
Eosinophils Relative: 3.5 % (ref 0.0–5.0)
HCT: 41.8 % (ref 36.0–46.0)
Hemoglobin: 13.8 g/dL (ref 12.0–15.0)
Lymphocytes Relative: 35.4 % (ref 12.0–46.0)
Lymphs Abs: 3.6 10*3/uL (ref 0.7–4.0)
MCHC: 33.1 g/dL (ref 30.0–36.0)
MCV: 82.5 fl (ref 78.0–100.0)
Monocytes Absolute: 0.9 10*3/uL (ref 0.1–1.0)
Monocytes Relative: 9.3 % (ref 3.0–12.0)
Neutro Abs: 5.1 10*3/uL (ref 1.4–7.7)
Neutrophils Relative %: 50.8 % (ref 43.0–77.0)
Platelets: 203 10*3/uL (ref 150.0–400.0)
RBC: 5.07 Mil/uL (ref 3.87–5.11)
RDW: 17.5 % — ABNORMAL HIGH (ref 11.5–15.5)
WBC: 10.1 10*3/uL (ref 4.0–10.5)

## 2020-05-25 LAB — COMPREHENSIVE METABOLIC PANEL
ALT: 19 U/L (ref 0–35)
AST: 22 U/L (ref 0–37)
Albumin: 4 g/dL (ref 3.5–5.2)
Alkaline Phosphatase: 100 U/L (ref 39–117)
BUN: 11 mg/dL (ref 6–23)
CO2: 31 mEq/L (ref 19–32)
Calcium: 8.9 mg/dL (ref 8.4–10.5)
Chloride: 99 mEq/L (ref 96–112)
Creatinine, Ser: 0.97 mg/dL (ref 0.40–1.20)
GFR: 61.84 mL/min (ref >60.00)
Glucose, Bld: 93 mg/dL (ref 70–99)
Potassium: 3.8 mEq/L (ref 3.5–5.1)
Sodium: 136 mEq/L (ref 135–145)
Total Bilirubin: 0.5 mg/dL (ref 0.2–1.2)
Total Protein: 7.3 g/dL (ref 6.0–8.3)

## 2020-05-25 MED ORDER — DICYCLOMINE HCL 10 MG PO CAPS: 10.0000 mg | ORAL_CAPSULE | Freq: Two times a day (BID) | ORAL | 0 refills | Status: DC | PRN

## 2020-05-25 MED ORDER — OMEPRAZOLE 40 MG PO CPDR: DELAYED_RELEASE_CAPSULE | ORAL | 2 refills | Status: DC

## 2020-05-25 NOTE — Progress Notes (Signed)
RADIOLOGY SCHEDULING REQUEST SENT TO: [facility] Scheduling via secure staff message.  [facility] Phone: [phone] Fax: [phone]   Patient Name: [name] DOB: 1955-09-07 MRN #: [id number]  Imaging Ordered: CT abd pelvis  Diagnosis: abd pain/rule out diverticulitis  Ordering Provider: Noralyn Pick, CRNP  Is a Prior Authorization needed? We are in the process of obtaining it now  Is the patient Diabetic? Yes  Does the patient have Hypertension? Yes  Does the patient have any implanted devices or hardware? No  Date of last BUN/Creat, if needed? 05/25/20  Patient Weight? 215lb  Is the patient able to get on the table? Yes  Has the patient been diagnosed with COVID? No  Is the patient waiting on COVID testing results? No  Thank you for your assistance! [facility] Team

## 2020-05-25 NOTE — Progress Notes (Signed)
05/25/2020 [name] [id number] 1955-12-22   Chief Complaint: Lower abdominal pain   History of Present Illness: [name] is a 65 year old female with a past medical history of anxiety, depression, hypertension, hyperlipidemia, DM II, COPD (no longer uses home oxygen), back pain, hypothyroidism, IDA, GERD and colon polys.   She was last seen in office by Dr. Ardis Hughs on 11/24/2019 for further evaluation for diarrhea after starting Metformin and nausea which was triggered from her grandson's use of marijuana in her home. At that time, she was advised to follow up with her PCP to discuss an alternative diabetes medication and Zofran was prescribed for her nausea.   She presents to our office today for further evaluation regarding lower abdominal pain which started 2 to 3 months ago. She describes having lower abdominal pain which is constant with intermittent cramp like pain. She has random days of diarrhea, approximately 4 days monthly, sometimes for 2 consecutive days. Stool is malodorous. She occasionally seed bright red blood on the toilet tissue on the days she has diarrhea. When she is not having diarrhea, she is passing soft solid orange brown colored stools. She passes a soft black stool every 4th BM for the past few months. No associated Pepto or oral iron use. She complains of having dysphagia twice weekly for the past year. Foods such as cornbread get stuck to the upper esophagus, she coughs or gags and the expelled food comes out. No heartburn.  She is on Omeprazole 20mg  po bid. She has chills and sweats during the day and night. She underwent an EGD 07/24/2017 showed evidence of previous peptic ulcer disease in the prepyloric region, mild gastritis was negative for H. Pylori.  A colonoscopy was done on the same date which showed a 82mm tubular adenomatous polyp removed from the ascending colon and diverticulosis to the left colon. Recall colonoscopy 5 years. She reports losing  65lbs since she was diagnose with diabetes more than one year ago. COPD is stable, per pulmonary notes patient refused to continue home oxygen.  She continue to smoke cigarettes.    CBC Latest Ref Rng & Units 03/01/2020 07/07/2019 04/29/2019  WBC 4.0 - 10.5 K/uL 9.9 8.9 7.5  Hemoglobin 12.0 - 15.0 g/dL 13.4 11.3(L) 10.1(L)  Hematocrit 36.0 - 46.0 % 40.4 37.4 35.1(L)  Platelets 150.0 - 400.0 K/uL 190.0 174 178    CMP Latest Ref Rng & Units 05/03/2020 11/30/2019 08/12/2019  Glucose 70 - 99 mg/dL 60(L) 76 204(H)  BUN 6 - 23 mg/dL 12 12 12   Creatinine 0.40 - 1.20 mg/dL 0.99 0.96 0.89  Sodium 135 - 145 mEq/L 138 134(L) 137  Potassium 3.5 - 5.1 mEq/L 4.1 3.9 4.4  Chloride 96 - 112 mEq/L 101 98 101  CO2 19 - 32 mEq/L 30 28 27   Calcium 8.4 - 10.5 mg/dL 8.9 9.0 8.6  Total Protein 6.0 - 8.3 g/dL 7.0 6.9 6.5  Total Bilirubin 0.2 - 1.2 mg/dL 0.5 0.5 0.5  Alkaline Phos 39 - 117 U/L 102 92 107  AST 0 - 37 U/L 27 23 44(H)  ALT 0 - 35 U/L 28 21 42(H)    Past Medical History:  Diagnosis Date  . Allergic rhinitis   . Anemia   . Anxiety   . Chicken pox   . Chronic back pain   . COPD (chronic obstructive pulmonary disease) ([place])   . Depression   . DM (diabetes mellitus) ([place])   . Essential hypertension   .  GERD (gastroesophageal reflux disease)   . Headache    migraines  . History of gastritis    EGD 2015  . History of home oxygen therapy    2 liters at hs last 6 months  . Hyperlipidemia   . Hypothyroidism   . Migraines   . Osteoarthritis    oa  . Scoliosis    Past Surgical History:  Procedure Laterality Date  . APPENDECTOMY     1985  . BIOPSY  07/24/2017   Procedure: BIOPSY;  Surgeon: Milus Banister, MD;  Location: [facility];  Service: Endoscopy;;  . CARDIAC CATHETERIZATION N/A 10/31/2015   Procedure: Left Heart Cath and Coronary Angiography;  Surgeon: Leonie Man, MD;  Location: [facility];  Service: Cardiovascular;  Laterality: N/A;  . CARPAL TUNNEL RELEASE Left   .  CARPAL TUNNEL RELEASE Right   . CHOLECYSTECTOMY  late 1980's  . COLONOSCOPY WITH PROPOFOL N/A 07/24/2017   Procedure: COLONOSCOPY WITH PROPOFOL;  Surgeon: Milus Banister, MD;  Location: [facility];  Service: Endoscopy;  Laterality: N/A;  . ESOPHAGOGASTRODUODENOSCOPY N/A 07/24/2017   Procedure: ESOPHAGOGASTRODUODENOSCOPY (EGD);  Surgeon: Milus Banister, MD;  Location: [facility];  Service: Endoscopy;  Laterality: N/A;  . GALLBLADDER SURGERY  1991  . HIP CLOSED REDUCTION Right 01/08/2016   Procedure: CLOSED MANIPULATION HIP;  Surgeon: Susa Day, MD;  Location: [facility];  Service: Orthopedics;  Laterality: Right;  . HIP CLOSED REDUCTION Right 01/19/2016   Procedure: ATTEMPTED CLOSED REDUCTION RIGHT HIP;  Surgeon: Wylene Simmer, MD;  Location: [facility];  Service: Orthopedics;  Laterality: Right;  . HIP CLOSED REDUCTION Right 01/20/2016   Procedure: CLOSED REDUCTION RIGHT TOTAL HIP;  Surgeon: Paralee Cancel, MD;  Location: [facility];  Service: Orthopedics;  Laterality: Right;  . HIP CLOSED REDUCTION Right 02/17/2016   Procedure: CLOSED REDUCTION RIGHT TOTAL HIP;  Surgeon: Rod Can, MD;  Location: [facility];  Service: Orthopedics;  Laterality: Right;  . HIP CLOSED REDUCTION Right 02/28/2016   Procedure: CLOSED REDUCTION HIP;  Surgeon: Nicholes Stairs, MD;  Location: [facility];  Service: Orthopedics;  Laterality: Right;  . POLYPECTOMY  07/24/2017   Procedure: POLYPECTOMY;  Surgeon: Milus Banister, MD;  Location: [facility];  Service: Endoscopy;;  . TONSILLECTOMY    . TOTAL ABDOMINAL HYSTERECTOMY     1985, with 1 ovary removed and 2 nd ovary removed 2003  . TOTAL HIP ARTHROPLASTY Right    Original surgery 2006 with revision 2010  . TOTAL HIP REVISION Right 01/01/2016   Procedure: TOTAL HIP REVISION;  Surgeon: Paralee Cancel, MD;  Location: [facility];  Service: Orthopedics;  Laterality: Right;  . TOTAL HIP REVISION Right 03/02/2016   Procedure: TOTAL HIP REVISION;  Surgeon: Paralee Cancel, MD;   Location: [facility];  Service: Orthopedics;  Laterality: Right;  . TOTAL HIP REVISION Right 09/02/2016   Procedure: Right hip constrained liner- posterior;  Surgeon: Paralee Cancel, MD;  Location: [facility];  Service: Orthopedics;  Laterality: Right;  . ULNAR NERVE TRANSPOSITION Right    Current Outpatient Medications on File Prior to Visit  Medication Sig Dispense Refill  . Accu-Chek Softclix Lancets lancets 4 (four) times daily.    Marland Kitchen albuterol (PROAIR HFA) 108 (90 Base) MCG/ACT inhaler 2 puffs every 4 hours as needed only  if your can't catch your breath (Patient taking differently: Inhale 2 puffs into the lungs every 4 (four) hours as needed for wheezing or shortness of breath. if your can't catch your breath) 6.7 g  0  . ALPRAZolam (XANAX) 1 MG tablet Take 1 tablet po BID as needed for anxiety. 60 tablet 0  . Aspirin-Acetaminophen-Caffeine (GOODY HEADACHE PO) Take 1 tablet by mouth as needed. As needed    . atorvastatin (LIPITOR) 20 MG tablet Take 1 tablet (20 mg total) by mouth at bedtime. 90 tablet 1  . DULoxetine (CYMBALTA) 30 MG capsule Take 1 capsule (30 mg total) by mouth at bedtime. X 1 week, then 60 mg/2 capsules- nightly- for nerve pain 60 capsule 3  . fluticasone (FLONASE) 50 MCG/ACT nasal spray USE TWO SPRAYS IN EACH NOSTRIL EVERY DAY 16 g 6  . furosemide (LASIX) 20 MG tablet TAKE 1 TABLET BY MOUTH EVERY DAY AS NEEDED 30 tablet 90  . glucose blood test strip Use as instructed 300 each 11  . HYDROcodone-acetaminophen (NORCO) 10-325 MG tablet Take 1 tablet by mouth every 8 (eight) hours as needed. 90 tablet 0  . ipratropium-albuterol (DUONEB) 0.5-2.5 (3) MG/3ML SOLN Take 3 mLs by nebulization 2 (two) times daily. 360 mL 2  . levothyroxine (SYNTHROID) 150 MCG tablet Take 1 tablet (150 mcg total) by mouth daily before breakfast. 30 tablet 5  . loperamide (IMODIUM A-D) 2 MG tablet Take 1 tablet (2 mg total) by mouth in the morning and at bedtime. 60 tablet 3  . loratadine (CLARITIN) 10 MG tablet  Take 10 mg by mouth daily.    . methocarbamol (ROBAXIN) 500 MG tablet Take 1 tablet (500 mg total) by mouth every 8 (eight) hours as needed for muscle spasms. 90 tablet 5  . ondansetron (ZOFRAN) 4 MG tablet Take 1 tablet (4 mg total) by mouth as needed for nausea. 50 tablet 6  . potassium chloride (KLOR-CON) 10 MEQ tablet Take 1 tablet (10 mEq total) by mouth at bedtime. 90 tablet 1  . Tiotropium Bromide Monohydrate (SPIRIVA RESPIMAT) 2.5 MCG/ACT AERS Inhale 2 puffs into the lungs daily. 12 g 0  . triamterene-hydrochlorothiazide (MAXZIDE-25) 37.5-25 MG tablet Take 1 tablet by mouth daily. 90 tablet 1   No current facility-administered medications on file prior to visit.   Allergies  Allergen Reactions  . Metformin And Related Diarrhea  . Nsaids Diarrhea  . Aleve [Naproxen Sodium] Other (See Comments)    Headache   . Codeine Nausea Only and Other (See Comments)    GI upset  . Penicillins Nausea Only and Other (See Comments)    GI upset   . Sulfonamide Derivatives Hives   Current Medications, Allergies, Past Medical History, Past Surgical History, Family History and Social History were reviewed in [employer] record.  Review of Systems:   Constitutional: Negative for fever, sweats, chills or weight loss.  Respiratory: Negative for shortness of breath.   Cardiovascular: Negative for chest pain, palpitations and leg swelling.  Gastrointestinal: See HPI.  Musculoskeletal: Negative for back pain or muscle aches.  Neurological: Negative for dizziness, headaches or paresthesias.   Physical Exam: There were no vitals taken for this visit.  Wt Readings from Last 3 Encounters:  05/03/20 217 lb 4 oz (98.5 kg)  04/10/20 218 lb (98.9 kg)  04/03/20 212 lb (96.2 kg)  11/24/2019     221lbs  General: Obese 65 year old female in no acute distress. Head: Normocephalic and atraumatic. Eyes: No scleral icterus. Conjunctiva pink . Ears: Normal auditory acuity. Mouth:  Dentition intact. No ulcers or lesions.  Lungs: Clear throughout to auscultation. Heart: Regular rate and rhythm, no murmur. Abdomen: Soft, nontender and nondistended. No masses or  hepatomegaly. Normal bowel sounds x 4 quadrants.  Rectal: Posterior external hemorrhoid with a sentinel tag component with mild white discoloration. Patient provided consent for photo, see in Media. No stool in the rectal vault. Melissa CMA present during exam.  Musculoskeletal: Symmetrical with no gross deformities. Extremities: No edema. Neurological: Alert oriented x 4. No focal deficits.  Psychological: Alert and cooperative. Normal mood and affect  Assessment and Recommendations:  14. 65 year old female with lower abdominal pain  -Dicyclomine 10mg  one po bid PRN  -CBC and CMP -CTAP with oral and IV contrast   2. Dysphagia  -Eventual EGD, await CTAP results prior to scheduling   3. Rectal bleeding. Posterior external hemorrhoids on exam with small white rubbery tag. Refer to photo in Media.  -Eventual diagnostic colonoscopy, await CTAP results  -Discussed future referral to general surgery for possible external hemorrhoidectomy  4. Melenic stools, intermittent  -Eventual EGD, await CTAP results s -CBC as ordered above -Heme slides, patient to complete if she sees a black stool  -Increase Omeprazole 40mg  po bid for now  5. Colon polyps -Recall colonoscopy due 06/2022  5. History of IDA -Check CBC as ordered above   6. Hepatic steatosis per CTAP 04/03/2017. Normal LFTs -Weight loss recommended   7. COPD, home oxygen discontinued as patient refused to continue use 02/2020. She continues to smoke cigarettes. Screening chest CT ordered by PCP

## 2020-05-25 NOTE — Patient Instructions (Addendum)
If you are age 64 or younger, your body mass index should be between 19-25. Your Body mass index is 38.7 kg/m. If this is out of the aformentioned range listed, please consider follow up with your Primary Care Provider.   LABS:  Lab work has been ordered for you today. Our lab is located in the basement. Press "B" on the elevator. The lab is located at the first door on the left as you exit the elevator.  HEALTHCARE LAWS AND MY CHART RESULTS: Due to recent changes in healthcare laws, you may see the results of your imaging and laboratory studies on MyChart before your provider has had a chance to review them.   We understand that in some cases there may be results that are confusing or concerning to you. Not all laboratory results come back in the same time frame and the provider may be waiting for multiple results in order to interpret others.  Please give [place] 48 hours in order for your provider to thoroughly review all the results before contacting the office for clarification of your results.   IMAGING:  . You will be contacted by [facility] (Your caller ID will indicate phone # [phone]) in the next 2 days to schedule your CT Scan. If you have not heard from them within 2 business days, please call [facility] at [phone] to follow up on the status of your appointment.    MEDICATION: We have sent the following medication to your pharmacy for you to pick up at your convenience: Omeprazole 40 MG, take 1 twice a day. Dicyclomine 10 MG, take 1 twice a day as needed for cramping.  Please call our office if your symptoms worsen. It was great seeing you today! Thank you for entrusting me with your care and choosing [facility].  Noralyn Pick, CRNP

## 2020-05-26 ENCOUNTER — Telehealth: Payer: Self-pay

## 2020-05-26 NOTE — Progress Notes (Signed)
I agree with the above note, plan 

## 2020-05-26 NOTE — Telephone Encounter (Signed)
[name] wanted to let you know she is now taking Dicyclomine / Bentyl 10 MG. It was prescribe by her Gertie Fey MD.   Call back phone [phone].

## 2020-05-29 ENCOUNTER — Encounter: Payer: Medicare HMO | Attending: Physical Medicine and Rehabilitation | Admitting: Physical Medicine and Rehabilitation

## 2020-05-29 ENCOUNTER — Encounter: Payer: Self-pay | Admitting: Physical Medicine and Rehabilitation

## 2020-05-29 ENCOUNTER — Other Ambulatory Visit: Payer: Self-pay

## 2020-05-29 VITALS — BP 120/79 | HR 85 | Temp 98.6°F | Ht 62.5 in | Wt 217.0 lb

## 2020-05-29 DIAGNOSIS — F419 Anxiety disorder, unspecified: Secondary | ICD-10-CM | POA: Insufficient documentation

## 2020-05-29 DIAGNOSIS — M48061 Spinal stenosis, lumbar region without neurogenic claudication: Secondary | ICD-10-CM | POA: Insufficient documentation

## 2020-05-29 DIAGNOSIS — M180 Bilateral primary osteoarthritis of first carpometacarpal joints: Secondary | ICD-10-CM | POA: Insufficient documentation

## 2020-05-29 DIAGNOSIS — G894 Chronic pain syndrome: Secondary | ICD-10-CM | POA: Diagnosis not present

## 2020-05-29 DIAGNOSIS — R69 Illness, unspecified: Secondary | ICD-10-CM | POA: Diagnosis not present

## 2020-05-29 DIAGNOSIS — J449 Chronic obstructive pulmonary disease, unspecified: Secondary | ICD-10-CM | POA: Diagnosis not present

## 2020-05-29 DIAGNOSIS — F32A Depression, unspecified: Secondary | ICD-10-CM | POA: Diagnosis not present

## 2020-05-29 MED ORDER — CYCLOBENZAPRINE HCL 10 MG PO TABS: 10.0000 mg | ORAL_TABLET | Freq: Three times a day (TID) | ORAL | 5 refills | Status: DC | PRN

## 2020-05-29 MED ORDER — LEVETIRACETAM 250 MG PO TABS: 250.0000 mg | ORAL_TABLET | Freq: Two times a day (BID) | ORAL | 5 refills | Status: DC

## 2020-05-29 MED ORDER — HYDROCODONE-ACETAMINOPHEN 10-325 MG PO TABS: 1.0000 | ORAL_TABLET | Freq: Three times a day (TID) | ORAL | 0 refills | Status: DC | PRN

## 2020-05-29 NOTE — Patient Instructions (Signed)
Pt is a 65 yr old female with hx of newly dx'd COPD, still smoking, HTN, DM- diet controlled; anxiety and depression;  Scoliosis and deg back issues,  Generalized OA;  chronic pain- here for f/u of chronic pain issues.    1. Dr Melvyn Novas does back injections- last done 6+ months- Dr Alvan Dame did knee and back- call to get an appointment for back epidural steroid injection.   2. Ortho- Looked at L elbow- fx- "too late to do anything about it"-but not healing as it should, but cannot fix it.    3.  Has tried Lyrica without improvement-   4. Can wean Cymbalta to 30 mg daily x 4 days, then stop.   5. Keppra/levicetrecem- 250 mg 2x/day x 1 week, then 500 mg 2x/day- max 1000 mg 2x/day-   6. Sent in next Norco dose to [facility]- rest of Rx[facility] to [facility] [place]- 10/325 mg 3x/day prn Due 06/08/20- sent in early, but cnanot refill early.   7. D/c Robaxin; Add Flexeril 10 mg 3x/day as needed- not use every day- 4 days/week.   8. F/U in 2 months

## 2020-05-29 NOTE — Progress Notes (Signed)
Subjective:    Patient ID: [name], female    DOB: 06/07/1955, 65 y.o.   MRN: [id number]  HPI    Pt is a 65 yr old female with hx of newly dx'd COPD, still smoking, HTN, DM- diet controlled; anxiety and depression;  Scoliosis and deg back issues,  Generalized OA;  chronic pain- here for f/u of chronic pain issues.    Robaxin didn't work for back pain at all.  . Got from GI- Dicyclomine- does help muscles in abdomen.    [facility]- their Generic Norco- wasn't helpful.  [facility] generic worked better[facility] didn't work as well - only lasts 1 hour and only takes the edge off.  Hasn't asked them what brand they were using.   Duloxetine- hasn't noticed a difference in this medicine at all.   Didn't notice any difference with increase Synthroid.   Feels like stomach is really hurting so much right now, hard to tell what else going on. Only Cheerios don't bother her stomach pain.   The whole way across low back- is throbbing Hasn't had injection for awhile- it helped "for a little while"- do better than knee injections.   Has cut Xanax 1 mg 2x/day.  Was taking  4x/day prior at the beginning of the year.   Pain Inventory Average Pain 7 Pain Right Now 9 My pain is constant, sharp, burning, stabbing, tingling and aching  In the last 24 hours, has pain interfered with the following? General activity 8 Relation with others 7 Enjoyment of life 9 What TIME of day is your pain at its worst? morning  and evening Sleep (in general) Poor  Pain is worse with: walking, bending and standing Pain improves with: rest and medication Relief from Meds: 3  Family History  Problem Relation Age of Onset  . COPD Mother   . Heart disease Mother   . Lung disease Father        Asbestosis  . Heart attack Father   . Heart disease Father   . Cerebral aneurysm Brother   . Aneurysm Brother        Brain  . Drug abuse Daughter   . Epilepsy Son   . Alcohol abuse Son   .  Drug abuse Son   . Arthritis Maternal Grandmother   . Heart disease Maternal Grandmother   . Asthma Maternal Grandfather   . Cancer Maternal Grandfather   . Arthritis Paternal Grandmother   . Heart disease Paternal Grandmother   . Stroke Paternal Grandmother   . Early death Paternal Grandfather   . Heart disease Paternal Grandfather    Social History   Socioeconomic History  . Marital status: Married    Spouse name: Not on file  . Number of children: 2  . Years of education: Not on file  . Highest education level: Not on file  Occupational History  . Occupation: disabled  Tobacco Use  . Smoking status: Current Every Day Smoker    Packs/day: 1.50    Years: 46.00    Pack years: 69.00    Types: Cigarettes  . Smokeless tobacco: Never Used  Vaping Use  . Vaping Use: Never used  Substance and Sexual Activity  . Alcohol use: No  . Drug use: No  . Sexual activity: Not Currently    Partners: Male  Other Topics Concern  . Not on file  Social History Narrative   Right handed    Caffeine~ 2  cups per day    Lives at home with husband (strained relationship)   Primary caretaker for disabled brother who had aneurism   Daughter died 2018/06/24    Social Determinants of Health   Financial Resource Strain: Low Risk   . Difficulty of Paying Living Expenses: Not hard at all  Food Insecurity: No Food Insecurity  . Worried About Charity fundraiser in the Last Year: Never true  . Ran Out of Food in the Last Year: Never true  Transportation Needs: No Transportation Needs  . Lack of Transportation (Medical): No  . Lack of Transportation (Non-Medical): No  Physical Activity: Inactive  . Days of Exercise per Week: 0 days  . Minutes of Exercise per Session: 0 min  Stress: Stress Concern Present  . Feeling of Stress : To some extent  Social Connections: Moderately Integrated  . Frequency of Communication with Friends and Family: More than three times a week  . Frequency of Social  Gatherings with Friends and Family: Once a week  . Attends Religious Services: 1 to 4 times per year  . Active Member of Clubs or Organizations: No  . Attends Archivist Meetings: Never  . Marital Status: Married   Past Surgical History:  Procedure Laterality Date  . APPENDECTOMY     1985  . BIOPSY  07/24/2017   Procedure: BIOPSY;  Surgeon: Milus Banister, MD;  Location: [facility];  Service: Endoscopy;;  . CARDIAC CATHETERIZATION N/A 10/31/2015   Procedure: Left Heart Cath and Coronary Angiography;  Surgeon: Leonie Man, MD;  Location: [facility];  Service: Cardiovascular;  Laterality: N/A;  . CARPAL TUNNEL RELEASE Left   . CARPAL TUNNEL RELEASE Right   . CHOLECYSTECTOMY  late 1980's  . COLONOSCOPY WITH PROPOFOL N/A 07/24/2017   Procedure: COLONOSCOPY WITH PROPOFOL;  Surgeon: Milus Banister, MD;  Location: [facility];  Service: Endoscopy;  Laterality: N/A;  . ESOPHAGOGASTRODUODENOSCOPY N/A 07/24/2017   Procedure: ESOPHAGOGASTRODUODENOSCOPY (EGD);  Surgeon: Milus Banister, MD;  Location: [facility];  Service: Endoscopy;  Laterality: N/A;  . GALLBLADDER SURGERY  1991  . HIP CLOSED REDUCTION Right 01/08/2016   Procedure: CLOSED MANIPULATION HIP;  Surgeon: Susa Day, MD;  Location: [facility];  Service: Orthopedics;  Laterality: Right;  . HIP CLOSED REDUCTION Right 01/19/2016   Procedure: ATTEMPTED CLOSED REDUCTION RIGHT HIP;  Surgeon: Wylene Simmer, MD;  Location: [facility];  Service: Orthopedics;  Laterality: Right;  . HIP CLOSED REDUCTION Right 01/20/2016   Procedure: CLOSED REDUCTION RIGHT TOTAL HIP;  Surgeon: Paralee Cancel, MD;  Location: [facility];  Service: Orthopedics;  Laterality: Right;  . HIP CLOSED REDUCTION Right 02/17/2016   Procedure: CLOSED REDUCTION RIGHT TOTAL HIP;  Surgeon: Rod Can, MD;  Location: [facility];  Service: Orthopedics;  Laterality: Right;  . HIP CLOSED REDUCTION Right 02/28/2016   Procedure: CLOSED REDUCTION HIP;  Surgeon: Nicholes Stairs, MD;  Location: [facility];  Service: Orthopedics;  Laterality: Right;  . POLYPECTOMY  07/24/2017   Procedure: POLYPECTOMY;  Surgeon: Milus Banister, MD;  Location: [facility];  Service: Endoscopy;;  . TONSILLECTOMY    . TOTAL ABDOMINAL HYSTERECTOMY     1985, with 1 ovary removed and 2 nd ovary removed 2003  . TOTAL HIP ARTHROPLASTY Right    Original surgery 2006 with revision 2010  . TOTAL HIP REVISION Right 01/01/2016   Procedure: TOTAL HIP REVISION;  Surgeon: Paralee Cancel, MD;  Location: [facility];  Service: Orthopedics;  Laterality:  Right;  Marland Kitchen TOTAL HIP REVISION Right 03/02/2016   Procedure: TOTAL HIP REVISION;  Surgeon: Paralee Cancel, MD;  [facility];  Service: Orthopedics;  Laterality: Right;  . TOTAL HIP REVISION Right 09/02/2016   Procedure: Right hip constrained liner- posterior;  Surgeon: Paralee Cancel, MD;  Location: [facility];  Service: Orthopedics;  Laterality: Right;  . ULNAR NERVE TRANSPOSITION Right    Past Surgical History:  Procedure Laterality Date  . APPENDECTOMY     1985  . BIOPSY  07/24/2017   Procedure: BIOPSY;  Surgeon: Milus Banister, MD;  Location: [facility];  Service: Endoscopy;;  . CARDIAC CATHETERIZATION N/A 10/31/2015   Procedure: Left Heart Cath and Coronary Angiography;  Surgeon: Leonie Man, MD;  Location: [facility];  Service: Cardiovascular;  Laterality: N/A;  . CARPAL TUNNEL RELEASE Left   . CARPAL TUNNEL RELEASE Right   . CHOLECYSTECTOMY  late 1980's  . COLONOSCOPY WITH PROPOFOL N/A 07/24/2017   Procedure: COLONOSCOPY WITH PROPOFOL;  Surgeon: Milus Banister, MD;  Location: [facility];  Service: Endoscopy;  Laterality: N/A;  . ESOPHAGOGASTRODUODENOSCOPY N/A 07/24/2017   Procedure: ESOPHAGOGASTRODUODENOSCOPY (EGD);  Surgeon: Milus Banister, MD;  Location: [facility];  Service: Endoscopy;  Laterality: N/A;  . GALLBLADDER SURGERY  1991  . HIP CLOSED REDUCTION Right 01/08/2016   Procedure: CLOSED MANIPULATION HIP;  Surgeon: Susa Day, MD;  [facility];  Service: Orthopedics;  Laterality: Right;  . HIP CLOSED REDUCTION Right 01/19/2016   Procedure: ATTEMPTED CLOSED REDUCTION RIGHT HIP;  Surgeon: Wylene Simmer, MD;  Location[facility];  Service: Orthopedics;  Laterality: Right;  . HIP CLOSED REDUCTION Right 01/20/2016   Procedure: CLOSED REDUCTION RIGHT TOTAL HIP;  Surgeon: Paralee Cancel, MD;  Location: [facility];  Service: Orthopedics;  Laterality: Right;  . HIP CLOSED REDUCTION Right 02/17/2016   Procedure: CLOSED REDUCTION RIGHT TOTAL HIP;  Surgeon: Rod Can, MD;  Location: [facility];  Service: Orthopedics;  Laterality: Right;  . HIP CLOSED REDUCTION Right 02/28/2016   Procedure: CLOSED REDUCTION HIP;  Surgeon: Nicholes Stairs, MD;  Location[facility];  Service: Orthopedics;  Laterality: Right;  . POLYPECTOMY  07/24/2017   Procedure: POLYPECTOMY;  Surgeon: Milus Banister, MD;  Location: [facility];  Service: Endoscopy;;  . TONSILLECTOMY    . TOTAL ABDOMINAL HYSTERECTOMY     1985, with 1 ovary removed and 2 nd ovary removed 2003  . TOTAL HIP ARTHROPLASTY Right    Original surgery 2006 with revision 2010  . TOTAL HIP REVISION Right 01/01/2016   Procedure: TOTAL HIP REVISION;  Surgeon: Paralee Cancel, MD;  Location: [facility];  Service: Orthopedics;  Laterality: Right;  . TOTAL HIP REVISION Right 03/02/2016   Procedure: TOTAL HIP REVISION;  Surgeon: Paralee Cancel, MD;  Location: [facility];  Service: Orthopedics;  Laterality: Right;  . TOTAL HIP REVISION Right 09/02/2016   Procedure: Right hip constrained liner- posterior;  Surgeon: Paralee Cancel, MD;  Location: [facility];  Service: Orthopedics;  Laterality: Right;  . ULNAR NERVE TRANSPOSITION Right    Past Medical History:  Diagnosis Date  . Allergic rhinitis   . Anemia   . Anxiety   . Chicken pox   . Chronic back pain   . COPD (chronic obstructive pulmonary disease) ([place])   . Depression   . DM (diabetes mellitus) ([place])   . Essential hypertension   . GERD  (gastroesophageal reflux disease)   . Headache    migraines  . History of gastritis  EGD 2015  . History of home oxygen therapy    2 liters at hs last 6 months  . Hyperlipidemia   . Hypothyroidism   . Migraines   . Osteoarthritis    oa  . Scoliosis    BP 120/79   Pulse 85   Temp 98.6 F (37 C)   Ht 5' 2.5" (1.588 m)   Wt 217 lb (98.4 kg)   SpO2 91%   BMI 39.06 kg/m   Opioid Risk Score:   Fall Risk Score:  `1  Depression screen PHQ 2/9  Depression screen [facility] 2/9 04/10/2020 12/27/2019 11/30/2019 08/11/2019 07/27/2019 04/12/2019 12/16/2018  Decreased Interest 2 0 2 3 3 1 2   Down, Depressed, Hopeless 1 2 1 3 3 1 3   PHQ - 2 Score 3 2 3 6 6 2 5   Altered sleeping 0 1 0 3 3 0 0  Tired, decreased energy 2 1 2 3 3  0 3  Change in appetite 0 1 0 3 3 0 2  Feeling bad or failure about yourself  1 1 0 3 3 0 2  Trouble concentrating 1 0 0 3 3 0 1  Moving slowly or fidgety/restless 0 0 0 3 3 0 0  Suicidal thoughts 0 0 0 - 0 0 0  PHQ-9 Score 7 6 5 24 24 2 13   Difficult doing work/chores - Somewhat difficult Somewhat difficult - Very difficult Somewhat difficult Somewhat difficult  Some recent data might be hidden    Review of Systems  Musculoskeletal: Positive for back pain and gait problem.       Spasms Joint pain  Neurological: Positive for weakness and numbness.  Psychiatric/Behavioral: Positive for dysphoric mood. The patient is nervous/anxious.   All other systems reviewed and are negative.      Objective:   Physical Exam  Awake, alert, appropriate, smells of smoke, NAD Sitting on table -bracing self with arms/hands TTP over CMC joint B/L TTP across low back in band      Assessment & Plan:    Pt is a 65 yr old female with hx of newly dx'd COPD, still smoking, HTN, DM- diet controlled; anxiety and depression;  Scoliosis and deg back issues,  Generalized OA;  chronic pain- here for f/u of chronic pain issues.    1. Dr Melvyn Novas does back injections- last done 6+  months- Dr Alvan Dame did knee and back- call to get an appointment for back epidural steroid injection.   2. Ortho- Looked at L elbow- fx- "too late to do anything about it"-but not healing as it should, but cannot fix it.    3.  Has tried Lyrica without improvement-   4. Can wean Cymbalta to 30 mg daily x 4 days, then stop.   5. Keppra/levicetrecem- 250 mg 2x/day x 1 week, then 500 mg 2x/day- max 1000 mg 2x/day-   6. Sent in next [facility]co dose to [facility]- [facility] [place]- 10/325 mg 3x/day prn Due 06/08/20- sent in early, but cnanot refill early.   7. D/c Robaxin; Add Flexeril 10 mg 3x/day as needed- not use every day- 4 days/week.   8. F/U in 2 months  I spent a total of 30 minutes on visit- discussing pain meds as well as changing med options.

## 2020-06-01 ENCOUNTER — Encounter: Payer: Self-pay | Admitting: Family Medicine

## 2020-06-01 ENCOUNTER — Other Ambulatory Visit: Payer: Self-pay

## 2020-06-01 ENCOUNTER — Ambulatory Visit (INDEPENDENT_AMBULATORY_CARE_PROVIDER_SITE_OTHER): Payer: Medicare HMO | Admitting: Family Medicine

## 2020-06-01 VITALS — BP 116/75 | HR 85 | Temp 98.0°F | Ht 62.5 in | Wt 216.4 lb

## 2020-06-01 DIAGNOSIS — E119 Type 2 diabetes mellitus without complications: Secondary | ICD-10-CM

## 2020-06-01 DIAGNOSIS — F32A Depression, unspecified: Secondary | ICD-10-CM

## 2020-06-01 DIAGNOSIS — G894 Chronic pain syndrome: Secondary | ICD-10-CM

## 2020-06-01 DIAGNOSIS — R69 Illness, unspecified: Secondary | ICD-10-CM | POA: Diagnosis not present

## 2020-06-01 DIAGNOSIS — F419 Anxiety disorder, unspecified: Secondary | ICD-10-CM | POA: Diagnosis not present

## 2020-06-01 NOTE — Assessment & Plan Note (Signed)
She is doing well off cymbalta.  Declined referral for therapy.  Does not want to do any other medications at this point.

## 2020-06-01 NOTE — Assessment & Plan Note (Signed)
Managed by physical medicine.  She is weaning off of Cymbalta which seems to be going well.

## 2020-06-01 NOTE — Patient Instructions (Addendum)
It was very nice to see you today!  No changes today.  Please come back in a few months  to see [name].  Take care, Dr Jerline Pain  PLEASE NOTE:  If you had any lab tests please let us know if you have not heard back within a few days. You may see your results on mychart before we have a chance to review them but we will give you a call once they are reviewed by [place]. If we ordered any referrals today, please let us know if you have not heard from their office within the next week.   Please try these tips to maintain a healthy lifestyle:   Eat at least 3 REAL meals and 1-2 snacks per day.  Aim for no more than 5 hours between eating.  If you eat breakfast, please do so within one hour of getting up.    Each meal should contain half fruits/vegetables, one quarter protein, and one quarter carbs (no bigger than a computer mouse)   Cut down on sweet beverages. This includes juice, soda, and sweet tea.     Drink at least 1 glass of water with each meal and aim for at least 8 glasses per day   Exercise at least 150 minutes every week.

## 2020-06-01 NOTE — Assessment & Plan Note (Signed)
Home sugars are reasonably well controlled.  Defer checking A1c today.  She will follow-up with her PCP in 3 months to recheck.  She did not start her Ozempic and has not been on any medications.  She will continue checking blood sugars at home.

## 2020-06-01 NOTE — Progress Notes (Signed)
   [name] is a 65 y.o. female who presents today for an office visit.  Assessment/Plan:  Chronic Problems Addressed Today: Diabetes mellitus, new onset ([place]) Home sugars are reasonably well controlled.  Defer checking A1c today.  She will follow-up with her PCP in 3 months to recheck.  She did not start her Ozempic and has not been on any medications.  She will continue checking blood sugars at home.  Chronic pain syndrome Managed by physical medicine.  She is weaning off of Cymbalta which seems to be going well.  Anxiety and depression   She is doing well off cymbalta.  Declined referral for therapy.  Does not want to do any other medications at this point.    Subjective:  HPI:  patient here for follow-up.  Last saw her PCP about a month ago.  Her PCP is out on maternity leave and she is following up with me today.  She is overall doing well.  She has been checking her blood sugars at home.  Her fasting sugars are typically in the 120 range.  Evening blood sugars in the 80s to 90s.  She is not currently on any medications.  Her PCP had previously discussed starting her on Ozempic but there was concern for low blood sugar.  She has not been on anything.  She has been working on cutting out carbs in her diet.  She also recently saw her physiatrist.  She was weaned off her Cymbalta and started on Keppra.  She has not noticed that this is made much of a difference however has been switched to Flexeril which does seem to make improvement.       Objective:  Physical Exam: BP 116/75   Pulse 85   Temp 98 F (36.7 C)   Ht 5' 2.5" (1.588 m)   Wt 216 lb 6.1 oz (98.1 kg)   SpO2 95%   BMI 38.95 kg/m   Wt Readings from Last 3 Encounters:  06/01/20 216 lb 6.1 oz (98.1 kg)  05/29/20 217 lb (98.4 kg)  05/25/20 215 lb (97.5 kg)  Gen: No acute distress, resting comfortably CV: Regular rate and rhythm with no murmurs appreciated Pulm: Normal work of breathing, clear to auscultation  bilaterally with no crackles, wheezes, or rhonchi Neuro: Grossly normal, moves all extremities Psych: Normal affect and thought content      Wilmer Berryhill M. Jerline Pain, MD 06/01/2020 2:53 PM

## 2020-06-05 ENCOUNTER — Telehealth: Payer: Self-pay

## 2020-06-05 ENCOUNTER — Ambulatory Visit (HOSPITAL_COMMUNITY): Payer: Medicare HMO

## 2020-06-05 NOTE — Telephone Encounter (Signed)
[name] has been taking the Levetiracetam as directed. But she is sleeping way too much.   Patient want to know if she should stop the medicatoni? Or will the sleepiness go away?  Call back phone [phone].

## 2020-06-06 NOTE — Telephone Encounter (Signed)
She can try the dose only at night- just take the night dose and not the AM dose and see if that helps- if not, and sleepiness hasn't gotten better, then will need to try another medicine- however most medications for pain CAN cause sedation- Let me know what she wants to do, Thanks, ML

## 2020-06-06 NOTE — Telephone Encounter (Signed)
Patient advised.

## 2020-06-08 ENCOUNTER — Telehealth: Payer: Self-pay | Admitting: Nurse Practitioner

## 2020-06-08 NOTE — Telephone Encounter (Signed)
Patient called said she was scheduled to have a CT but they called her from [facility] to advise her insurance will not cover please advise.

## 2020-06-08 NOTE — Telephone Encounter (Signed)
CT was denied by insurance.  Do you want to order alternat imaging?

## 2020-06-09 ENCOUNTER — Ambulatory Visit (HOSPITAL_COMMUNITY): Payer: Medicare HMO

## 2020-06-11 NOTE — Telephone Encounter (Signed)
Beth, pls call patient on Monday 5/16 and obtain symptom update. If her lower abdominal pain persists or has worsened pls contact Dr. Ardis Hughs and let him know since CTAP not authorized as I will be out of the office this week. She will need eventual EGD and colonoscopy likely at [facility] as she self discontinued home oxygen.  THX

## 2020-06-11 NOTE — Telephone Encounter (Signed)
[name], see msg below, thx

## 2020-06-12 ENCOUNTER — Telehealth: Payer: Self-pay

## 2020-06-12 NOTE — Telephone Encounter (Signed)
Patient seen April 28 for lower abdominal pain and nausea by Carl Best, NP. CT order but denied by insurance per radiology.  Called the patient to check her symptoms as instructed by CKS. Patient reports her symptoms are persistent. Her nausea is controlled by Zofran and she has not vomited. She has "doubling over" abdominal pain when she eats "no matter what I eat." She has loose to soft stools. Taking Dicyclomine regularly. Indigestion seems worse despite omeprazole BID. Please advise in Colleen's absence.

## 2020-06-14 ENCOUNTER — Encounter: Payer: Self-pay | Admitting: Physician Assistant

## 2020-06-14 ENCOUNTER — Encounter: Payer: Self-pay | Admitting: Family Medicine

## 2020-06-14 ENCOUNTER — Ambulatory Visit (HOSPITAL_COMMUNITY): Payer: Medicare HMO

## 2020-06-14 ENCOUNTER — Encounter: Payer: Medicare HMO | Admitting: Acute Care

## 2020-06-14 MED ORDER — DICYCLOMINE HCL 10 MG PO CAPS: 10.0000 mg | ORAL_CAPSULE | Freq: Two times a day (BID) | ORAL | 0 refills | Status: DC | PRN

## 2020-06-14 MED ORDER — ALPRAZOLAM 1 MG PO TABS: ORAL_TABLET | ORAL | 0 refills | Status: DC

## 2020-06-14 MED ORDER — ONDANSETRON HCL 4 MG PO TABS: 4.0000 mg | ORAL_TABLET | ORAL | 6 refills | Status: DC | PRN

## 2020-06-14 NOTE — Telephone Encounter (Signed)
Pt requesting refill for Xanax 1 mg, Last OV 5/5 with Alyssa.

## 2020-06-15 ENCOUNTER — Telehealth: Payer: Self-pay

## 2020-06-15 NOTE — Telephone Encounter (Signed)
Pt called asking if Dr. Jerline Pain could fill out a form for her property taxes stating she is disabled so she can get half off of her property taxes. Pt states the form is only one page. Please advise.

## 2020-06-16 ENCOUNTER — Other Ambulatory Visit: Payer: Self-pay

## 2020-06-16 DIAGNOSIS — R1030 Lower abdominal pain, unspecified: Secondary | ICD-10-CM

## 2020-06-16 DIAGNOSIS — K921 Melena: Secondary | ICD-10-CM

## 2020-06-16 DIAGNOSIS — R110 Nausea: Secondary | ICD-10-CM

## 2020-06-16 NOTE — Telephone Encounter (Signed)
I can't guarantee I will be able to complete it but she can drop the form off for [place] to take a look.  Algis Greenhouse. Jerline Pain, MD 06/16/2020 10:48 AM

## 2020-06-16 NOTE — Telephone Encounter (Signed)
That is really too bad about her CT scan.  Lets get abdominal imaging with a full abdominal ultrasound instead.  Please also put her in for my first available return office visit appointment and also put her on my wait list if a sooner appointment in the office opens up prior to then.

## 2020-06-16 NOTE — Telephone Encounter (Signed)
Spoke with the patient. She agrees to this plan of care. Abd u/s on 06/23/20 at 9:30 am, arrive 9:15 am to radiology. NPO p MN Dr Ardis Hughs 06/28/20 at 3:40 pm. Arrive 3:25 pm.

## 2020-06-16 NOTE — Telephone Encounter (Signed)
Called the patient to discuss. No answer. Left her brief information on her voicemail and asked she call me back today to discuss.

## 2020-06-16 NOTE — Telephone Encounter (Signed)
Patient stated her form is complete.

## 2020-06-16 NOTE — Telephone Encounter (Signed)
Please advise 

## 2020-06-20 ENCOUNTER — Encounter: Payer: Self-pay | Admitting: Pulmonary Disease

## 2020-06-20 ENCOUNTER — Ambulatory Visit: Payer: Medicare HMO | Admitting: Pulmonary Disease

## 2020-06-20 ENCOUNTER — Other Ambulatory Visit: Payer: Self-pay

## 2020-06-20 VITALS — BP 114/64 | HR 79 | Temp 97.9°F | Ht 62.0 in | Wt 212.4 lb

## 2020-06-20 DIAGNOSIS — Z720 Tobacco use: Secondary | ICD-10-CM | POA: Diagnosis not present

## 2020-06-20 DIAGNOSIS — J9611 Chronic respiratory failure with hypoxia: Secondary | ICD-10-CM | POA: Diagnosis not present

## 2020-06-20 DIAGNOSIS — J449 Chronic obstructive pulmonary disease, unspecified: Secondary | ICD-10-CM

## 2020-06-20 MED ORDER — TRELEGY ELLIPTA 100-62.5-25 MCG/INH IN AEPB: 1.0000 | INHALATION_SPRAY | Freq: Every day | RESPIRATORY_TRACT | 6 refills | Status: DC

## 2020-06-20 MED ORDER — TRELEGY ELLIPTA 100-62.5-25 MCG/INH IN AEPB: 1.0000 | INHALATION_SPRAY | Freq: Every day | RESPIRATORY_TRACT | 0 refills | Status: DC

## 2020-06-20 MED ORDER — ALBUTEROL SULFATE HFA 108 (90 BASE) MCG/ACT IN AERS
INHALATION_SPRAY | RESPIRATORY_TRACT | 3 refills | Status: DC
Start: 2020-06-20 — End: 2021-04-10

## 2020-06-20 NOTE — Patient Instructions (Addendum)
Moderate COPD (FEV1 66%) - uncontrolled Nocturnal hypoxemia  Sleep study neg 2016 --START Trelegy 100 ONE puff ONCE a day --CONTINUE Albuterol as needed for shortness of breath or wheezing. REFILL --Declined oxygen since 2021. Discussed risks related to being off oxygen including MI, CVA  Tobacco Abuse --STOP smoking!!!  Follow-up with me in 4-6 weeks

## 2020-06-20 NOTE — Progress Notes (Signed)
@Patient  ID: [name], female    DOB: 1955-05-06, 65 y.o.   MRN: [id number]  Chief Complaint  Patient presents with   Follow-up    Reporting no change to shortness of breath from previous visits.     [name] is a 65 year old female active smoker with COPD and chronic respiratory failure, scoliosis and DDD who presents as new patient.   She has shortness of breath with talking and short distances. She is able to climb stairs one step at a time and goes slow pace. Her activity is limited due to back and bilateral hip pain s/p multiple hip surgeries. Endorses wheezing daily. Has chronic cough with some yellow-gray sputum production.   She is not compliant with her home oxygen for > 1 year due to difficulty tolerate the tubing. She has tried Spiriva which she tolerated however insurance did not pay for it. Nebulizers were tolerable however the setup and time spent on the machine was cumbersome.  Social History: Active smoker Daughter passed from OD two years ago Currently has grandson living with her, stressor  I have personally reviewed patient's past medical/family/social history/allergies/current medications.  Allergies  Allergen Reactions   Metformin And Related Diarrhea   Nsaids Diarrhea   Aleve [Naproxen Sodium] Other (See Comments)    Headache    Codeine Nausea Only and Other (See Comments)    GI upset   Penicillins Nausea Only and Other (See Comments)    GI upset    Sulfonamide Derivatives Hives    Immunization History  Administered Date(s) Administered   Influenza,inj,Quad PF,6+ Mos 10/02/2018, 11/30/2019   Influenza-Unspecified 01/16/2011, 11/26/2011, 12/22/2012, 11/17/2018   PFIZER Comirnaty(Gray Top)Covid-19 Tri-Sucrose Vaccine 05/17/2020   PFIZER(Purple Top)SARS-COV-2 Vaccination 10/20/2019, 11/10/2019   Td 06/07/2005    Past Medical History:  Diagnosis Date   Allergic rhinitis    Anemia    Anxiety    Chicken pox    Chronic back pain     COPD (chronic obstructive pulmonary disease) (HCC)    Depression    DM (diabetes mellitus) ([place])    Essential hypertension    GERD (gastroesophageal reflux disease)    Headache    migraines   History of gastritis    EGD 2015   History of home oxygen therapy    2 liters at hs last 6 months   Hyperlipidemia    Hypothyroidism    Migraines    Osteoarthritis    oa   Scoliosis     Tobacco History: Social History   Tobacco Use  Smoking Status Current Every Day Smoker   Packs/day: 1.50   Years: 46.00   Pack years: 69.00   Types: Cigarettes  Smokeless Tobacco Never Used   Ready to quit: Not Answered Counseling given: Not Answered   Outpatient Medications Prior to Visit  Medication Sig Dispense Refill   Accu-Chek Softclix Lancets lancets 4 (four) times daily.     albuterol (PROAIR HFA) 108 (90 Base) MCG/ACT inhaler 2 puffs every 4 hours as needed only  if your can't catch your breath (Patient taking differently: Inhale 2 puffs into the lungs every 4 (four) hours as needed for wheezing or shortness of breath. if your can't catch your breath) 6.7 g 0   ALPRAZolam (XANAX) 1 MG tablet Take 1 tablet po BID as needed for anxiety. 60 tablet 0   Aspirin-Acetaminophen-Caffeine (GOODY HEADACHE PO) Take 1 tablet by mouth as needed. As needed     atorvastatin (LIPITOR) 20 MG tablet  Take 1 tablet (20 mg total) by mouth at bedtime. 90 tablet 1   cyclobenzaprine (FLEXERIL) 10 MG tablet Take 1 tablet (10 mg total) by mouth 3 (three) times daily as needed for muscle spasms. 90 tablet 5   dicyclomine (BENTYL) 10 MG capsule Take 1 capsule (10 mg total) by mouth 2 (two) times daily as needed for spasms. 30 capsule 0   fluticasone (FLONASE) 50 MCG/ACT nasal spray USE TWO SPRAYS IN EACH NOSTRIL EVERY DAY 16 g 6   furosemide (LASIX) 20 MG tablet TAKE 1 TABLET BY MOUTH EVERY DAY AS NEEDED 30 tablet 90   glucose blood test strip Use as instructed 300 each 11   HYDROcodone-acetaminophen (NORCO) 10-325 MG  tablet Take 1 tablet by mouth every 8 (eight) hours as needed. 90 tablet 0   levothyroxine (SYNTHROID) 150 MCG tablet Take 1 tablet (150 mcg total) by mouth daily before breakfast. 30 tablet 5   loperamide (IMODIUM A-D) 2 MG tablet Take 1 tablet (2 mg total) by mouth in the morning and at bedtime. 60 tablet 3   loratadine (CLARITIN) 10 MG tablet Take 10 mg by mouth daily.     omeprazole (PRILOSEC) 40 MG capsule TAKE ONE CAPSULE BY MOUTH DAILY BEFORE BREAKFAST and TAKE ONE CAPSULE DAILY BEFORE DINNER 60 capsule 2   ondansetron (ZOFRAN) 4 MG tablet Take 1 tablet (4 mg total) by mouth as needed for nausea. 50 tablet 6   potassium chloride (KLOR-CON) 10 MEQ tablet Take 1 tablet (10 mEq total) by mouth at bedtime. 90 tablet 1   triamterene-hydrochlorothiazide (MAXZIDE-25) 37.5-25 MG tablet Take 1 tablet by mouth daily. 90 tablet 1   DULoxetine (CYMBALTA) 30 MG capsule Take 1 capsule (30 mg total) by mouth at bedtime. X 1 week, then 60 mg/2 capsules- nightly- for nerve pain 60 capsule 3   ipratropium-albuterol (DUONEB) 0.5-2.5 (3) MG/3ML SOLN Take 3 mLs by nebulization 2 (two) times daily. (Patient not taking: Reported on 06/20/2020) 360 mL 2   levETIRAcetam (KEPPRA) 250 MG tablet Take 1 tablet (250 mg total) by mouth 2 (two) times daily. X 1 week, then 500 mg BID- for nerve pain- 120 tablet 5   methocarbamol (ROBAXIN) 500 MG tablet Take 1 tablet (500 mg total) by mouth every 8 (eight) hours as needed for muscle spasms. 90 tablet 5   Tiotropium Bromide Monohydrate (SPIRIVA RESPIMAT) 2.5 MCG/ACT AERS Inhale 2 puffs into the lungs daily. (Patient not taking: Reported on 06/20/2020) 12 g 0   No facility-administered medications prior to visit.   Review of Systems  Constitutional:  Negative for chills, diaphoresis, fever, malaise/fatigue and weight loss.  HENT:  Negative for congestion.   Respiratory:  Positive for cough, sputum production, shortness of breath and wheezing. Negative for hemoptysis.    Cardiovascular:  Negative for chest pain, palpitations and leg swelling.   Physical Exam  BP 114/64 (BP Location: Left Arm, Cuff Size: Normal)   Pulse 79   Temp 97.9 F (36.6 C) (Temporal)   Ht 5\' 2"  (1.575 m)   Wt 212 lb 6.4 oz (96.3 kg)   SpO2 93% Comment: RA  BMI 38.85 kg/m   Physical Exam: General: Well-appearing, no acute distress HENT: [place], AT Eyes: EOMI, no scleral icterus Respiratory: Clear to auscultation bilaterally.  No crackles, wheezing or rales Cardiovascular: RRR, -M/R/G, no JVD Extremities:-Edema,-tenderness Neuro: AAO x4, CNII-XII grossly intact Skin: Intact, no rashes or bruising Psych: Normal mood, normal affect   Data Reviewed:  PFTs: 05/26/17 FVC 2.38 (72%) FEV1  1.6 (63%) Ratio 63 TLC 105% RV 141% DLCO 55% Interpretation: Moderate obstructive defect with significant bronchodilator response in FEV1. With presence and airtrapping and reduced gas exchange, findings are consistent with emphysema.  Labs: - Allergy profile 03/18/17 >  Eos 0.3 /  IgE  4 neg RAST   Alpha-1 December 05, 2017 normal, MM, 174  Chest imaging: HRCT chest 12/24/2017-no ILD, mild to moderate emphysema  Imaging, labs and test noted above have been reviewed independently by me.  Assessment & Plan:   No problem-specific Assessment & Plan notes found for this encounter.  Moderate COPD (FEV1 66%) - uncontrolled Nocturnal hypoxemia  Sleep study neg 2016 --START Trelegy 100 ONE puff ONCE a day --CONTINUE Albuterol as needed for shortness of breath or wheezing. REFILL --Declined oxygen since 2021. Discussed risks related to being off oxygen including MI, CVA  Tobacco abuse Patient is an active smoker. We discussed smoking cessation for 5 minutes. We discussed triggers and stressors and ways to deal with them. We discussed barriers to continued smoking and benefits of smoking cessation.   Health Maintenance Immunization History  Administered Date(s) Administered    Influenza,inj,Quad PF,6+ Mos 10/02/2018, 11/30/2019   Influenza-Unspecified 01/16/2011, 11/26/2011, 12/22/2012, 11/17/2018   PFIZER Comirnaty(Gray Top)Covid-19 Tri-Sucrose Vaccine 05/17/2020   PFIZER(Purple Top)SARS-COV-2 Vaccination 10/20/2019, 11/10/2019   Td 06/07/2005   CT Lung Screen - discuss at next visit  No orders of the defined types were placed in this encounter. Meds ordered this encounter  Medications   DISCONTD: Fluticasone-Umeclidin-Vilant (TRELEGY ELLIPTA) 100-62.5-25 MCG/INH AEPB    Sig: Inhale 1 puff into the lungs daily.    Dispense:  28 each    Refill:  0    Order Specific Question:   Lot Number?    Answer:   [id number]    Order Specific Question:   Expiration Date?    Answer:   01/28/2022    Order Specific Question:   Manufacturer?    Answer:   [employer] [[id number]]    Order Specific Question:   Quantity    Answer:   2   Fluticasone-Umeclidin-Vilant (TRELEGY ELLIPTA) 100-62.5-25 MCG/INH AEPB    Sig: Inhale 1 puff into the lungs daily.    Dispense:  60 each    Refill:  6   albuterol (PROAIR HFA) 108 (90 Base) MCG/ACT inhaler    Sig: 2 puffs every 4 hours as needed only  if your can't catch your breath    Dispense:  18 g    Refill:  3   Return in about 6 weeks (around 08/01/2020).  I have spent a total time of 42-minutes on the day of the appointment reviewing prior documentation, coordinating care and discussing medical diagnosis and plan with the patient/family. Imaging, labs and tests included in this note have been reviewed and interpreted independently by me.  Meilah Delrosario Rodman Pickle, MD 06/20/2020

## 2020-06-23 ENCOUNTER — Ambulatory Visit (HOSPITAL_COMMUNITY): Payer: Medicare HMO

## 2020-06-28 ENCOUNTER — Encounter: Payer: Self-pay | Admitting: Gastroenterology

## 2020-06-28 ENCOUNTER — Other Ambulatory Visit (INDEPENDENT_AMBULATORY_CARE_PROVIDER_SITE_OTHER): Payer: Medicare HMO

## 2020-06-28 ENCOUNTER — Ambulatory Visit: Payer: Medicare HMO | Admitting: Gastroenterology

## 2020-06-28 VITALS — BP 120/74 | HR 85 | Ht 62.5 in | Wt 211.0 lb

## 2020-06-28 DIAGNOSIS — E119 Type 2 diabetes mellitus without complications: Secondary | ICD-10-CM

## 2020-06-28 DIAGNOSIS — I10 Essential (primary) hypertension: Secondary | ICD-10-CM

## 2020-06-28 DIAGNOSIS — R1030 Lower abdominal pain, unspecified: Secondary | ICD-10-CM | POA: Diagnosis not present

## 2020-06-28 NOTE — Patient Instructions (Addendum)
If you are age 64 or younger, your body mass index should be between 19-25. Your Body mass index is 37.98 kg/m. If this is out of the aformentioned range listed, please consider follow up with your Primary Care Provider.   __________________________________________________________  The [facility] providers would like to encourage you to use [facility] to communicate with providers for non-urgent requests or questions.  Due to long hold times on the telephone, sending your provider a message by [facility] may be a faster and more efficient way to get a response.  Please allow 48 business hours for a response.  Please remember that this is for non-urgent requests.   You have been scheduled for a CT scan of the abdomen and pelvis at [facility] ([address]---this is in the same building as [employer]).   You are scheduled on 06-30-2020 at 915am. You should arrive 30 minutes prior to your appointment time for registration. Please follow the written instructions below on the day of your exam:  WARNING: IF YOU ARE ALLERGIC TO IODINE/X-RAY DYE, PLEASE NOTIFY RADIOLOGY IMMEDIATELY AT [phone]! YOU WILL BE GIVEN A 13 HOUR PREMEDICATION PREP.  1) Do not eat or drink anything after 515am (4 hours prior to your test) 2) You have been given 2 bottles of oral contrast to drink. The solution may taste better if refrigerated, but do NOT add ice or any other liquid to this solution. Shake well before drinking.    Drink 1 bottle of contrast @ 715am (2 hours prior to your exam)  Drink 1 bottle of contrast @ 815am (1 hour prior to your exam)  You may take any medications as prescribed with a small amount of water, if necessary. If you take any of the following medications: METFORMIN, GLUCOPHAGE, GLUCOVANCE, AVANDAMET, RIOMET, FORTAMET, [place] MET, JANUMET, GLUMETZA or METAGLIP, you MAY be asked to HOLD this medication 48 hours AFTER the exam.  The purpose of you drinking the oral  contrast is to aid in the visualization of your intestinal tract. The contrast solution may cause some diarrhea. Depending on your individual set of symptoms, you may also receive an intravenous injection of x-ray contrast/dye. Plan on being at [facility] for 30 minutes or longer, depending on the type of exam you are having performed.  This test typically takes 30-45 minutes to complete.  If you have any questions regarding your exam or if you need to reschedule, you may call the CT department at [phone] between the hours of 8:00 am and 5:00 pm, Monday-Friday.  ________________________________________________________________________  Your provider has requested that you go to the basement level for lab work before leaving today. Press "B" on the elevator. The lab is located at the first door on the left as you exit the elevator.  Due to recent changes in healthcare laws, you may see the results of your imaging and laboratory studies on MyChart before your provider has had a chance to review them.  We understand that in some cases there may be results that are confusing or concerning to you. Not all laboratory results come back in the same time frame and the provider may be waiting for multiple results in order to interpret others.  Please give [place] 48 hours in order for your provider to thoroughly review all the results before contacting the office for clarification of your results.   Thank you for entrusting me with your care and choosing [facility].  Dr Ardis Hughs

## 2020-06-28 NOTE — Progress Notes (Signed)
Review of pertinent gastrointestinal problems: 1.  Adenomatous colon polyp.  Colonoscopy June 2019 for iron deficiency anemia found a single subcentimeter adenoma. 2.  EGD June 2019 for iron deficiency anemia showed evidence of previous peptic ulcer disease in the prepyloric region, mild gastritis was negative for H. pylori.  I recommended repeat CBC in 2 months and that she strictly avoid NSAIDs especially Goody powders.     HPI: This is a pleasant 65 year old woman   I last saw her about 8 years ago myself here in the office.  She was in more recently and saw [place].  We discussed diarrhea and nausea.  Both of these started around the time of her starting to take metformin.  I recommend she try taking Imodium on a daily basis and I wrote her prescription for Zofran to take as needed for nausea related to her grandson smoking marijuana.   About 6 weeks ago she was here and saw [facility].  They discussed some lower abdominal pains which she has been having for 3 or 4 months.  She also has been losing some weight.  Colleen recommended CT scan abdomen pelvis however her insurance company declined to help her pay for that.  Instead full abdominal ultrasound was ordered.  That is due next week.  Blood work April 2022 shows normal CBC and normal complete metabolic profile  Her weight is down 10 pounds since past 6 months, same scale here in our office  I asked her to come in today for my evaluation of her lower abdominal pains.  She has had a lower abdominal pain for several months.  It is constant but waxes and wanes throughout the day.  It is not associate with moving her bowels.  Does not make her nauseous.  She says sometimes it makes her double over.  The pain is very crampy and sharp at times.  She takes 2-4 Goody powders daily.  This is for headache pain and back pain.  She is also on hydrocodone for back pains.  She intermittently sees minor bright red blood per rectum on the tissue  paper.  She was told by her pulmonologist that she is "in the final stages" of COPD  She continues to smoke   ROS: complete GI ROS as described in HPI, all other review negative.  Constitutional:  No unintentional weight loss   Past Medical History:  Diagnosis Date  . Allergic rhinitis   . Anemia   . Anxiety   . Chicken pox   . Chronic back pain   . COPD (chronic obstructive pulmonary disease) ([place])   . Depression   . DM (diabetes mellitus) ([place])   . Essential hypertension   . GERD (gastroesophageal reflux disease)   . Headache    migraines  . History of gastritis    EGD 2015  . History of home oxygen therapy    2 liters at hs last 6 months  . Hyperlipidemia   . Hypothyroidism   . Migraines   . Osteoarthritis    oa  . Scoliosis     Past Surgical History:  Procedure Laterality Date  . APPENDECTOMY     1985  . BIOPSY  07/24/2017   Procedure: BIOPSY;  Surgeon: Milus Banister, MD;  Location: [facility];  Service: Endoscopy;;  . CARDIAC CATHETERIZATION N/A 10/31/2015   Procedure: Left Heart Cath and Coronary Angiography;  Surgeon: Leonie Man, MD;  Location: [facility];  Service: Cardiovascular;  Laterality: N/A;  .  CARPAL TUNNEL RELEASE Left   . CARPAL TUNNEL RELEASE Right   . CHOLECYSTECTOMY  late 1980's  . COLONOSCOPY WITH PROPOFOL N/A 07/24/2017   Procedure: COLONOSCOPY WITH PROPOFOL;  Surgeon: Milus Banister, MD;  Location: [facility];  Service: Endoscopy;  Laterality: N/A;  . ESOPHAGOGASTRODUODENOSCOPY N/A 07/24/2017   Procedure: ESOPHAGOGASTRODUODENOSCOPY (EGD);  Surgeon: Milus Banister, MD;  Location: [facility];  Service: Endoscopy;  Laterality: N/A;  . GALLBLADDER SURGERY  1991  . HIP CLOSED REDUCTION Right 01/08/2016   Procedure: CLOSED MANIPULATION HIP;  Surgeon: Susa Day, MD;  Location: [facility];  Service: Orthopedics;  Laterality: Right;  . HIP CLOSED REDUCTION Right 01/19/2016   Procedure: ATTEMPTED CLOSED REDUCTION RIGHT HIP;   Surgeon: Wylene Simmer, MD;  Location: [facility];  Service: Orthopedics;  Laterality: Right;  . HIP CLOSED REDUCTION Right 01/20/2016   Procedure: CLOSED REDUCTION RIGHT TOTAL HIP;  Surgeon: Paralee Cancel, MD;  Location: [facility];  Service: Orthopedics;  Laterality: Right;  . HIP CLOSED REDUCTION Right 02/17/2016   Procedure: CLOSED REDUCTION RIGHT TOTAL HIP;  Surgeon: Rod Can, MD;  Location: [facility];  Service: Orthopedics;  Laterality: Right;  . HIP CLOSED REDUCTION Right 02/28/2016   Procedure: CLOSED REDUCTION HIP;  Surgeon: Nicholes Stairs, MD;  Location: [facility];  Service: Orthopedics;  Laterality: Right;  . POLYPECTOMY  07/24/2017   Procedure: POLYPECTOMY;  Surgeon: Milus Banister, MD;  Location: [facility];  Service: Endoscopy;;  . TONSILLECTOMY    . TOTAL ABDOMINAL HYSTERECTOMY     1985, with 1 ovary removed and 2 nd ovary removed 2003  . TOTAL HIP ARTHROPLASTY Right    Original surgery 2006 with revision 2010  . TOTAL HIP REVISION Right 01/01/2016   Procedure: TOTAL HIP REVISION;  Surgeon: Paralee Cancel, MD;  Location: [facility];  Service: Orthopedics;  Laterality: Right;  . TOTAL HIP REVISION Right 03/02/2016   Procedure: TOTAL HIP REVISION;  Surgeon: Paralee Cancel, MD;  Location: [facility];  Service: Orthopedics;  Laterality: Right;  . TOTAL HIP REVISION Right 09/02/2016   Procedure: Right hip constrained liner- posterior;  Surgeon: Paralee Cancel, MD;  Location: [facility];  Service: Orthopedics;  Laterality: Right;  . ULNAR NERVE TRANSPOSITION Right     Current Outpatient Medications  Medication Sig Dispense Refill  . Accu-Chek Softclix Lancets lancets 4 (four) times daily.    Marland Kitchen albuterol (PROAIR HFA) 108 (90 Base) MCG/ACT inhaler 2 puffs every 4 hours as needed only  if your can't catch your breath 18 g 3  . ALPRAZolam (XANAX) 1 MG tablet Take 1 tablet po BID as needed for anxiety. 60 tablet 0  . Aspirin-Acetaminophen-Caffeine (GOODY HEADACHE PO) Take 1 tablet by mouth as needed. As needed     . atorvastatin (LIPITOR) 20 MG tablet Take 1 tablet (20 mg total) by mouth at bedtime. 90 tablet 1  . cyclobenzaprine (FLEXERIL) 10 MG tablet Take 1 tablet (10 mg total) by mouth 3 (three) times daily as needed for muscle spasms. 90 tablet 5  . dicyclomine (BENTYL) 10 MG capsule Take 1 capsule (10 mg total) by mouth 2 (two) times daily as needed for spasms. 30 capsule 0  . DULoxetine (CYMBALTA) 30 MG capsule Take 1 capsule (30 mg total) by mouth at bedtime. X 1 week, then 60 mg/2 capsules- nightly- for nerve pain 60 capsule 3  . fluticasone (FLONASE) 50 MCG/ACT nasal spray USE TWO SPRAYS IN EACH NOSTRIL EVERY DAY 16 g 6  . Fluticasone-Umeclidin-Vilant (TRELEGY ELLIPTA) 100-62.5-25 MCG/INH  AEPB Inhale 1 puff into the lungs daily. 60 each 6  . furosemide (LASIX) 20 MG tablet TAKE 1 TABLET BY MOUTH EVERY DAY AS NEEDED 30 tablet 90  . glucose blood test strip Use as instructed 300 each 11  . HYDROcodone-acetaminophen (NORCO) 10-325 MG tablet Take 1 tablet by mouth every 8 (eight) hours as needed. 90 tablet 0  . levothyroxine (SYNTHROID) 150 MCG tablet Take 1 tablet (150 mcg total) by mouth daily before breakfast. 30 tablet 5  . loperamide (IMODIUM A-D) 2 MG tablet Take 1 tablet (2 mg total) by mouth in the morning and at bedtime. 60 tablet 3  . loratadine (CLARITIN) 10 MG tablet Take 10 mg by mouth daily.    Marland Kitchen omeprazole (PRILOSEC) 40 MG capsule TAKE ONE CAPSULE BY MOUTH DAILY BEFORE BREAKFAST and TAKE ONE CAPSULE DAILY BEFORE DINNER 60 capsule 2  . ondansetron (ZOFRAN) 4 MG tablet Take 1 tablet (4 mg total) by mouth as needed for nausea. 50 tablet 6  . potassium chloride (KLOR-CON) 10 MEQ tablet Take 1 tablet (10 mEq total) by mouth at bedtime. 90 tablet 1  . triamterene-hydrochlorothiazide (MAXZIDE-25) 37.5-25 MG tablet Take 1 tablet by mouth daily. 90 tablet 1   No current facility-administered medications for this visit.    Allergies as of 06/28/2020 - Review Complete 06/28/2020  Allergen  Reaction Noted  . Metformin and related Diarrhea 05/03/2020  . Nsaids Diarrhea 06/10/2017  . Aleve [naproxen sodium] Other (See Comments) 10/26/2015  . Codeine Nausea Only and Other (See Comments)   . Penicillins Nausea Only and Other (See Comments)   . Sulfonamide derivatives Hives     Family History  Problem Relation Age of Onset  . COPD Mother   . Heart disease Mother   . Lung disease Father        Asbestosis  . Heart attack Father   . Heart disease Father   . Cerebral aneurysm Brother   . Aneurysm Brother        Brain  . Drug abuse Daughter   . Epilepsy Son   . Alcohol abuse Son   . Drug abuse Son   . Arthritis Maternal Grandmother   . Heart disease Maternal Grandmother   . Asthma Maternal Grandfather   . Cancer Maternal Grandfather   . Arthritis Paternal Grandmother   . Heart disease Paternal Grandmother   . Stroke Paternal Grandmother   . Early death Paternal Grandfather   . Heart disease Paternal Grandfather     Social History   Socioeconomic History  . Marital status: Married    Spouse name: Not on file  . Number of children: 2  . Years of education: Not on file  . Highest education level: Not on file  Occupational History  . Occupation: disabled  Tobacco Use  . Smoking status: Current Every Day Smoker    Packs/day: 1.50    Years: 46.00    Pack years: 69.00    Types: Cigarettes  . Smokeless tobacco: Never Used  . Tobacco comment: tobacco info given  Vaping Use  . Vaping Use: Never used  Substance and Sexual Activity  . Alcohol use: No  . Drug use: No  . Sexual activity: Not Currently    Partners: Male  Other Topics Concern  . Not on file  Social History Narrative   Right handed    Caffeine~ 2 cups per day    Lives at home with husband (strained relationship)   Primary caretaker for disabled brother who  had aneurism   Daughter died May 2020    Social Determinants of Health   Financial Resource Strain: Low Risk   . Difficulty of Paying  Living Expenses: Not hard at all  Food Insecurity: No Food Insecurity  . Worried About Charity fundraiser in the Last Year: Never true  . Ran Out of Food in the Last Year: Never true  Transportation Needs: No Transportation Needs  . Lack of Transportation (Medical): No  . Lack of Transportation (Non-Medical): No  Physical Activity: Inactive  . Days of Exercise per Week: 0 days  . Minutes of Exercise per Session: 0 min  Stress: Stress Concern Present  . Feeling of Stress : To some extent  Social Connections: Moderately Integrated  . Frequency of Communication with Friends and Family: More than three times a week  . Frequency of Social Gatherings with Friends and Family: Once a week  . Attends Religious Services: 1 to 4 times per year  . Active Member of Clubs or Organizations: No  . Attends Archivist Meetings: Never  . Marital Status: Married  Human resources officer Violence: Not At Risk  . Fear of Current or Ex-Partner: No  . Emotionally Abused: No  . Physically Abused: No  . Sexually Abused: No     Physical Exam: BP 120/74   Pulse 85   Ht 5' 2.5" (1.588 m)   Wt 211 lb (95.7 kg)   BMI 37.98 kg/m  Constitutional: generally well-appearing Psychiatric: alert and oriented x3 Abdomen: soft, mild to moderately tender lower abdomen, nondistended, no obvious ascites, no peritoneal signs, normal bowel sounds No peripheral edema noted in lower extremities  Assessment and plan: 65 y.o. female with lower abdominal pain, lower abdominal tenderness, weight loss  Unclear etiology of her pains.  Blood tests 6 weeks ago were normal including CBC and complete metabolic profile.  She is fairly tender on examination in her lower abdomen.  I explained her the Gabriel Earing powders can cause ulcers and that GI tract but this seems quite a bit lower than usual for ulcer related pain.  She needs CT scan abdomen pelvis with IV and oral contrast as first test for these pains.  We are going to try again  to order this through her insurance company.  Please see the "Patient Instructions" section for addition details about the plan.  Owens Loffler, MD [facility] 06/28/2020, 3:35 PM   Total time on date of encounter was 30 minutes (this included time spent preparing to see the patient reviewing records; obtaining and/or reviewing separately obtained history; performing a medically appropriate exam and/or evaluation; counseling and educating the patient and family if present; ordering medications, tests or procedures if applicable; and documenting clinical information in the health record).

## 2020-06-29 ENCOUNTER — Telehealth: Payer: Self-pay | Admitting: Gastroenterology

## 2020-06-29 ENCOUNTER — Other Ambulatory Visit: Payer: Self-pay

## 2020-06-29 DIAGNOSIS — J449 Chronic obstructive pulmonary disease, unspecified: Secondary | ICD-10-CM | POA: Diagnosis not present

## 2020-06-29 DIAGNOSIS — R109 Unspecified abdominal pain: Secondary | ICD-10-CM

## 2020-06-29 LAB — BASIC METABOLIC PANEL
BUN: 12 mg/dL (ref 6–23)
CO2: 24 mEq/L (ref 19–32)
Calcium: 9.1 mg/dL (ref 8.4–10.5)
Chloride: 100 mEq/L (ref 96–112)
Creatinine, Ser: 0.98 mg/dL (ref 0.40–1.20)
GFR: 61.04 mL/min (ref >60.00)
Glucose, Bld: 78 mg/dL (ref 70–99)
Potassium: 4 mEq/L (ref 3.5–5.1)
Sodium: 138 mEq/L (ref 135–145)

## 2020-06-29 NOTE — Telephone Encounter (Signed)
Hey Dr Ardis Hughs, this pt's CT scan was denied by [employer] and if interested an appeal would need to be initiated through the health plan directly, please advise on how you would like to proceed with this case.

## 2020-06-29 NOTE — Telephone Encounter (Signed)
Inbound call from patient asking if she can take 2 tablets of dicyclomine medication at once if her pain is severe.  Please advise.

## 2020-06-29 NOTE — Telephone Encounter (Signed)
Doc of the Day Spoke with the patient. This is the same pain she has been dealing with. It is in her lower abdomen. It feels better if she applies pressure, It is sharp stabbing pain. She has taken Bentyl 10 mg once today. This usually helps, but today it is not. She feels the pain is worse today. No change in any of her other medications. She just took her regular Goody Pwd. She has had her Vicodin today as scheduled. She would like to take 20 mg of Dicyclomine. Dr Ardis Hughs is out of the office. Can you advise please?

## 2020-06-29 NOTE — Telephone Encounter (Signed)
OK, she should continue with plans for [place] next week.   Yes, lets start an appeal or please tell her to start an appeal if that is the proper way to do it.

## 2020-06-29 NOTE — Telephone Encounter (Signed)
As per our conversation, I am trying to get the CT abd/pelvis approved. Previously denied by her insurance. She is scheduled for an u/s.

## 2020-06-30 ENCOUNTER — Inpatient Hospital Stay: Admission: RE | Admit: 2020-06-30 | Payer: Medicare HMO | Source: Ambulatory Visit

## 2020-06-30 ENCOUNTER — Ambulatory Visit (HOSPITAL_BASED_OUTPATIENT_CLINIC_OR_DEPARTMENT_OTHER): Admission: RE | Admit: 2020-06-30 | Payer: Medicare HMO | Source: Ambulatory Visit

## 2020-06-30 ENCOUNTER — Other Ambulatory Visit: Payer: Medicare HMO

## 2020-06-30 NOTE — Telephone Encounter (Signed)
DOD Thanks for letting me know Can proceed with [place] abdo complete If negative, needs CT RG

## 2020-07-03 NOTE — Telephone Encounter (Signed)
[name], cancel the abdominal sonogram, patient to proceed with CTAP as scheduled .THX

## 2020-07-03 NOTE — Telephone Encounter (Signed)
Cancelled the abdominal ultrasound scheduled for 07/04/20. The patient is aware.

## 2020-07-03 NOTE — Telephone Encounter (Signed)
[name] is off today. This patient's insurance has finally approved the CT abd/pelvis. That is scheduled for 07/05/20.  Does she need the abd u/s that is scheduled for tomorrow?

## 2020-07-04 ENCOUNTER — Ambulatory Visit (HOSPITAL_COMMUNITY): Payer: Medicare HMO

## 2020-07-05 ENCOUNTER — Telehealth: Payer: Self-pay | Admitting: Physical Medicine and Rehabilitation

## 2020-07-05 ENCOUNTER — Ambulatory Visit (INDEPENDENT_AMBULATORY_CARE_PROVIDER_SITE_OTHER)
Admission: RE | Admit: 2020-07-05 | Discharge: 2020-07-05 | Disposition: A | Discharge status: Home / Self Care | Payer: Medicare HMO | Source: Ambulatory Visit | Attending: Gastroenterology | Admitting: Gastroenterology

## 2020-07-05 ENCOUNTER — Other Ambulatory Visit: Payer: Self-pay

## 2020-07-05 DIAGNOSIS — G894 Chronic pain syndrome: Secondary | ICD-10-CM

## 2020-07-05 DIAGNOSIS — R1030 Lower abdominal pain, unspecified: Secondary | ICD-10-CM | POA: Diagnosis not present

## 2020-07-05 DIAGNOSIS — R109 Unspecified abdominal pain: Secondary | ICD-10-CM | POA: Diagnosis not present

## 2020-07-05 MED ORDER — HYDROCODONE-ACETAMINOPHEN 10-325 MG PO TABS: 1.0000 | ORAL_TABLET | Freq: Three times a day (TID) | ORAL | 0 refills | Status: DC | PRN

## 2020-07-05 MED ORDER — IOHEXOL 300 MG/ML  SOLN
100.0000 mL | Freq: Once | INTRAMUSCULAR | Status: AC | PRN
  Administered 2020-07-05: 100 mL via INTRAVENOUS

## 2020-07-05 NOTE — Telephone Encounter (Signed)
Notified. 

## 2020-07-05 NOTE — Telephone Encounter (Signed)
Patient came in office asking for refill on Hydrocodone. She wanted to let you know she is no longer taking Keppra and she has went back to taking Cymbalta. She stated that is working well.

## 2020-07-05 NOTE — Telephone Encounter (Signed)
Refilled Norco 10/325 mg TID- #90- last refill 05/29/20- so was due.  So, good to know on Cymbalta and Norco- if that's working, great

## 2020-07-12 ENCOUNTER — Other Ambulatory Visit: Payer: Self-pay | Admitting: Family Medicine

## 2020-07-13 ENCOUNTER — Telehealth: Payer: Self-pay

## 2020-07-13 ENCOUNTER — Telehealth: Payer: Self-pay | Admitting: Nurse Practitioner

## 2020-07-13 DIAGNOSIS — Z720 Tobacco use: Secondary | ICD-10-CM | POA: Insufficient documentation

## 2020-07-13 MED ORDER — ALPRAZOLAM 1 MG PO TABS: ORAL_TABLET | ORAL | 0 refills | Status: DC

## 2020-07-13 NOTE — Telephone Encounter (Signed)
DOD She has seen the CT results and is concerned. Would you review them and advise please? Patient was seen by Dr Ardis Hughs 06/28/20 for abdominal pain and the weight loss. Insurance caused a delay in the imaging because of denial to authorize the CT. Ultimately it was approved.  She is + for tobacco use.

## 2020-07-13 NOTE — Chronic Care Management (AMB) (Signed)
Chronic Care Management Pharmacy Assistant   Name: [name]  MRN: [id number] DOB: 1955-10-16   Reason for Encounter: Diabetes mellitus Disease State call   Recent office visits:  06/01/20-Caleb Jerline Pain MD (PCP)- Office visit for anxiety and depression. No medication changes.  Follow up in a few months.   05/03/20-Alyssa Allwardt PA-C (PCP)- Office visit for anxiety and depression. STARTED Semaglutide 0.25mg  inject 0.25mg  every 7 days. DISCONTINUED Nitrofurantoin Macrocrystal 100mg .  Follow up in 4 weeks.   04/04/20-Alyssa Allwardt PA-C (PCP)- Referral to Orthopedic.   04/03/20-Alyssa Allwardt PA-C (PCP)- Office visit for right elbow pain.  DISCONTINUED Budesonide 160-4.5 MCG/ACT, Buspirone HCL 7.5mg , Venlafaxine 75mg .  Follow up in 4 weeks.   Recent consult visits:  06/28/20-Daniel Ardis Hughs MD (Gastroenterology)- Office visit for lower abdominal pain. DISCONTINUED Ipratroprium Albuterol 0.5-2.5, Levetiracetam 250mg , Methocarbamol 500mg , Tiotropium Bromide Monohydrate 2.70mcg/ACT. Patient advised to avoid NSAIDS/Goody powders and take Immodium daily. CT ordered.  Follow up as scheduled.   06/20/20-Chi Rodman Pickle MD (Pulmonary)- Office visit for COPD. Patient reported not taking Symbicort and Spiriva. STARTED Futicasone-Umeclidin-Vilant 100-62.5-25 mcg/INH 1 puff daily. Follow up in 4-6 weeks.   05/29/20-Megan Lovorn MD (Physical rehab)- Office visit for anxiety and depression. STARTED Cylclobenzaprine HCL 10mg  take 1 three times daily as needed (not to exceed 4 days per week)and Levetiracetam 250mg  take 1 twice daily for 1 week, then 2 twice daily for nerve pain. Advised to DECREASED Cymbalta to 30mg  daily x 4 days then stop.  DISCONTINUED Robaxin. Follow up in 2 months.   05/25/20-Colleen Kennedy-Smith MD (Gastreoenterology)- Office visit for dysphagia. STARTED Dicyclomine 10mg  take 1 twice daily as needed. DISCONTINUED Semaglutide 0.25mg . INCREASED Omeprazole to 40mg  take 1 twice  daily. Follow up as scheduled.   04/14/20- Latanya Maudlin MD (Orthopaedic)- Office visit for pain of right elbow joint.- data unavailable.   04/10/20-Megan Lovorn MD (Physical rehab)- Office visit for anxiety and depression.  STARTED Duloxetine 30mg  take 1 daily at bedtime x 1 week, then take 2 nightly for nerve pain. STARTED Methocarbamol 500mg  take 1 every 8 hours as needed.  DISCONTINUED Benzonotate 100mg  and Buspirone 7.5mg .  Follow up in 6-8 weeks.   Hospital visits:   None in previous 6 months  Medications: Outpatient Encounter Medications as of 07/13/2020  Medication Sig   Accu-Chek Softclix Lancets lancets 4 (four) times daily.   albuterol (PROAIR HFA) 108 (90 Base) MCG/ACT inhaler 2 puffs every 4 hours as needed only  if your can't catch your breath   ALPRAZolam (XANAX) 1 MG tablet Take 1 tablet po BID as needed for anxiety.   Aspirin-Acetaminophen-Caffeine (GOODY HEADACHE PO) Take 1 tablet by mouth as needed. As needed   atorvastatin (LIPITOR) 20 MG tablet Take 1 tablet (20 mg total) by mouth at bedtime.   cyclobenzaprine (FLEXERIL) 10 MG tablet Take 1 tablet (10 mg total) by mouth 3 (three) times daily as needed for muscle spasms.   dicyclomine (BENTYL) 10 MG capsule Take 1 capsule (10 mg total) by mouth 2 (two) times daily as needed for spasms.   DULoxetine (CYMBALTA) 30 MG capsule Take 1 capsule (30 mg total) by mouth at bedtime. X 1 week, then 60 mg/2 capsules- nightly- for nerve pain   fluticasone (FLONASE) 50 MCG/ACT nasal spray USE TWO SPRAYS IN EACH NOSTRIL EVERY DAY   Fluticasone-Umeclidin-Vilant (TRELEGY ELLIPTA) 100-62.5-25 MCG/INH AEPB Inhale 1 puff into the lungs daily.   furosemide (LASIX) 20 MG tablet TAKE 1 TABLET BY MOUTH EVERY DAY AS NEEDED   glucose  blood test strip Use as instructed   HYDROcodone-acetaminophen (NORCO) 10-325 MG tablet Take 1 tablet by mouth every 8 (eight) hours as needed.   levothyroxine (SYNTHROID) 150 MCG tablet Take 1 tablet (150 mcg total)  by mouth daily before breakfast.   loperamide (IMODIUM A-D) 2 MG tablet Take 1 tablet (2 mg total) by mouth in the morning and at bedtime.   loratadine (CLARITIN) 10 MG tablet Take 10 mg by mouth daily.   omeprazole (PRILOSEC) 40 MG capsule TAKE ONE CAPSULE BY MOUTH DAILY BEFORE BREAKFAST and TAKE ONE CAPSULE DAILY BEFORE DINNER   ondansetron (ZOFRAN) 4 MG tablet Take 1 tablet (4 mg total) by mouth as needed for nausea.   potassium chloride (KLOR-CON) 10 MEQ tablet Take 1 tablet (10 mEq total) by mouth at bedtime.   triamterene-hydrochlorothiazide (MAXZIDE-25) 37.5-25 MG tablet Take 1 tablet by mouth daily.   No facility-administered encounter medications on file as of 07/13/2020.    Recent Relevant Labs: Lab Results  Component Value Date/Time   HGBA1C 6.5 03/01/2020 01:40 PM   HGBA1C 6.6 (H) 11/30/2019 04:09 PM   MICROALBUR 0.4 03/12/2019 01:54 PM    Kidney Function Lab Results  Component Value Date/Time   CREATININE 0.98 06/28/2020 04:19 PM   CREATININE 0.97 05/25/2020 12:30 PM   CREATININE 1.19 (H) 03/12/2019 01:54 PM   CREATININE 1.0 06/30/2014 02:48 PM   CREATININE 1.1 12/29/2013 01:05 PM   GFR 61.04 06/28/2020 04:19 PM   GFRNONAA 59 (L) 07/07/2019 02:04 PM   GFRAA >60 07/07/2019 02:04 PM    Current antihyperglycemic regimen:  None What recent interventions/DTPs have been made to improve glycemic control:  None Have there been any recent hospitalizations or ED visits since last visit with CPP? No Patient denies hypoglycemic symptoms, including Pale, Sweaty, Shaky, Hungry, Nervous/irritable, and Vision changes Patient denies hyperglycemic symptoms, including blurry vision, excessive thirst, fatigue, polyuria, and weakness How often are you checking your blood sugar? 3-4 times daily What are your blood sugars ranging?  Fasting: 100 Before meals: 105 After meals: 110 Bedtime: 90 During the week, how often does your blood glucose drop below 70?  Infrequently.  Almost  never.  Are you checking your feet daily/regularly?  Yes  Adherence Review: Is the patient currently on a STATIN medication? Yes Is the patient currently on ACE/ARB medication? No Does the patient have >5 day gap between last estimated fill dates? No   Patient reports she was having a hard time with her COPD. She is now on Trelegy daily and Albuterol twice daily. She thinks it is helping. Her chest doesn't feel quite as tight and returns to see her in 2 weeks. She denies problems obtaining her medications.  We discussed the hot weather we are currently having and she says she hates the summer time and stays indoors as much as possible.  She says even 80 degrees out is too hot for her.  She is very pleasant to talk to and informed her she can call me back if she is in need of anything.    Star Rating Drugs:  Atorvastatin 20mg   Last filled 04/18//22  90DS  [place]

## 2020-07-13 NOTE — Telephone Encounter (Signed)
Inbound call from patient inquiring about CT results. Callback number [phone]

## 2020-07-14 ENCOUNTER — Other Ambulatory Visit: Payer: Self-pay

## 2020-07-14 DIAGNOSIS — R9389 Abnormal findings on diagnostic imaging of other specified body structures: Secondary | ICD-10-CM

## 2020-07-14 NOTE — Telephone Encounter (Signed)
So the abdominal CT portion did not show problems to explain her pain  There is a possible lung lesion   She needs a chest CTA which includes contrast.  She has a current BUN and creatinine that are okay  Go ahead and order a chest CT angiogram regarding the lung lesion you will need to indicate that the radiologist recommended this and were trying to rule out a cancer  The last statement relates to the contrast shortage issues so you might need to speak to someone about that?

## 2020-07-14 NOTE — Telephone Encounter (Signed)
Called the patient to discuss her imaging results and the next step of a CTA.  No answer. Left her a message on the voicemail to call me back to discuss.

## 2020-07-14 NOTE — Telephone Encounter (Signed)
Spoke with the patient. Explained her CT imaging results and the CTA. Agreed to send the CT report to her pulmonologist Dr Loanne Drilling at [facility].

## 2020-07-17 ENCOUNTER — Other Ambulatory Visit: Payer: Self-pay

## 2020-07-17 DIAGNOSIS — R9389 Abnormal findings on diagnostic imaging of other specified body structures: Secondary | ICD-10-CM

## 2020-07-19 ENCOUNTER — Telehealth: Payer: Self-pay | Admitting: Nurse Practitioner

## 2020-07-19 NOTE — Telephone Encounter (Signed)
Patient is scheduled for the CT A of her chest. She wanted to be sure it was the correct test and that her insurance authorized it.

## 2020-07-19 NOTE — Telephone Encounter (Signed)
Inbound call from patient states CT told her to call the office. She didn't want to tell me why. Best contact number [phone]

## 2020-07-25 ENCOUNTER — Encounter: Payer: Self-pay | Admitting: Pulmonary Disease

## 2020-07-25 ENCOUNTER — Ambulatory Visit: Payer: Medicare HMO | Admitting: Pulmonary Disease

## 2020-07-25 ENCOUNTER — Telehealth: Payer: Self-pay | Admitting: Pulmonary Disease

## 2020-07-25 ENCOUNTER — Other Ambulatory Visit: Payer: Self-pay

## 2020-07-25 VITALS — BP 128/70 | HR 93 | Ht 62.0 in | Wt 212.0 lb

## 2020-07-25 DIAGNOSIS — J9611 Chronic respiratory failure with hypoxia: Secondary | ICD-10-CM

## 2020-07-25 DIAGNOSIS — J441 Chronic obstructive pulmonary disease with (acute) exacerbation: Secondary | ICD-10-CM

## 2020-07-25 DIAGNOSIS — J449 Chronic obstructive pulmonary disease, unspecified: Secondary | ICD-10-CM | POA: Diagnosis not present

## 2020-07-25 DIAGNOSIS — R911 Solitary pulmonary nodule: Secondary | ICD-10-CM | POA: Diagnosis not present

## 2020-07-25 DIAGNOSIS — Z720 Tobacco use: Secondary | ICD-10-CM

## 2020-07-25 MED ORDER — PREDNISONE 20 MG PO TABS: 20.0000 mg | ORAL_TABLET | Freq: Every day | ORAL | 0 refills | Status: DC

## 2020-07-25 MED ORDER — PREDNISONE 5 MG (21) PO TBPK: ORAL_TABLET | Freq: Every day | ORAL | 0 refills | Status: DC

## 2020-07-25 NOTE — Telephone Encounter (Signed)
COPD exacerbation --Continue Trelegy --Start prednisone 40 mg daily x 5 days  Called pt's pharmacy and spoke with Tye [place] to clarify instructions on pt's prednisone Rx that pt should take 40mg  once daily for 5 days. Cathy verbalized instructions back to me. Nothing further needed.

## 2020-07-25 NOTE — Progress Notes (Signed)
@Patient  ID: [name], female    DOB: Mar 15, 1955, 65 y.o.   MRN: [id number]  Chief Complaint  Patient presents with   COPD    Worsen copd     [name] is a 65 year old female active smoker with COPD and chronic respiratory failure, scoliosis and DDD who presents for follow-up. She presents with her husband  Since our last visit she was started on Trelegy. She reports shortness of breath is occurring more frequently and seems to occur in morning and late evenings. Wheezing and coughing with yellow-gray sputum are stable. Reports upper chest congestion. She has been checking her oxygen 84-88% in the morning. She reports confusion in the mornings.  At baseline, she has shortness of breath with talking and short distances. She is able to climb stairs one step at a time and goes slow pace. Her activity is limited due to back and bilateral hip pain s/p multiple hip surgeries. Endorses wheezing daily. Has chronic cough with some yellow-gray sputum production. She is not compliant with her home oxygen for > 1 year due to difficulty tolerate the tubing. She has tried Spiriva which she tolerated however insurance did not pay for it. Nebulizers were tolerable however the setup and time spent on the machine was cumbersome.  Social History: Active smoker Daughter passed from OD two years ago Currently has grandson living with her, stressor  I have personally reviewed patient's past medical/family/social history/allergies/current medications.   Past Medical History:  Diagnosis Date   Allergic rhinitis    Anemia    Anxiety    Chicken pox    Chronic back pain    COPD (chronic obstructive pulmonary disease) (HCC)    Depression    DM (diabetes mellitus) ([place])    Essential hypertension    GERD (gastroesophageal reflux disease)    Headache    migraines   History of gastritis    EGD 2015   History of home oxygen therapy    2 liters at hs last 6 months   Hyperlipidemia     Hypothyroidism    Migraines    Osteoarthritis    oa   Scoliosis     Outpatient Medications Prior to Visit  Medication Sig Dispense Refill   Accu-Chek Softclix Lancets lancets 4 (four) times daily.     albuterol (PROAIR HFA) 108 (90 Base) MCG/ACT inhaler 2 puffs every 4 hours as needed only  if your can't catch your breath 18 g 3   ALPRAZolam (XANAX) 1 MG tablet Take 1 tablet po BID as needed for anxiety. 60 tablet 0   Aspirin-Acetaminophen-Caffeine (GOODY HEADACHE PO) Take 1 tablet by mouth as needed. As needed     atorvastatin (LIPITOR) 20 MG tablet Take 1 tablet (20 mg total) by mouth at bedtime. 90 tablet 1   cyclobenzaprine (FLEXERIL) 10 MG tablet Take 1 tablet (10 mg total) by mouth 3 (three) times daily as needed for muscle spasms. 90 tablet 5   dicyclomine (BENTYL) 10 MG capsule Take 1 capsule (10 mg total) by mouth 2 (two) times daily as needed for spasms. 30 capsule 0   DULoxetine (CYMBALTA) 30 MG capsule Take 1 capsule (30 mg total) by mouth at bedtime. X 1 week, then 60 mg/2 capsules- nightly- for nerve pain 60 capsule 3   fluticasone (FLONASE) 50 MCG/ACT nasal spray USE TWO SPRAYS IN EACH NOSTRIL EVERY DAY 16 g 6   Fluticasone-Umeclidin-Vilant (TRELEGY ELLIPTA) 100-62.5-25 MCG/INH AEPB Inhale 1 puff into the lungs daily.  60 each 6   furosemide (LASIX) 20 MG tablet TAKE 1 TABLET BY MOUTH EVERY DAY AS NEEDED 30 tablet 90   glucose blood test strip Use as instructed 300 each 11   HYDROcodone-acetaminophen (NORCO) 10-325 MG tablet Take 1 tablet by mouth every 8 (eight) hours as needed. 90 tablet 0   levothyroxine (SYNTHROID) 150 MCG tablet Take 1 tablet (150 mcg total) by mouth daily before breakfast. 30 tablet 5   loperamide (IMODIUM A-D) 2 MG tablet Take 1 tablet (2 mg total) by mouth in the morning and at bedtime. 60 tablet 3   loratadine (CLARITIN) 10 MG tablet Take 10 mg by mouth daily.     omeprazole (PRILOSEC) 40 MG capsule TAKE ONE CAPSULE BY MOUTH DAILY BEFORE BREAKFAST and  TAKE ONE CAPSULE DAILY BEFORE DINNER 60 capsule 2   ondansetron (ZOFRAN) 4 MG tablet Take 1 tablet (4 mg total) by mouth as needed for nausea. 50 tablet 6   potassium chloride (KLOR-CON) 10 MEQ tablet Take 1 tablet (10 mEq total) by mouth at bedtime. 90 tablet 1   triamterene-hydrochlorothiazide (MAXZIDE-25) 37.5-25 MG tablet Take 1 tablet by mouth daily. 90 tablet 1   No facility-administered medications prior to visit.   Review of Systems  Constitutional:  Negative for chills, diaphoresis, fever, malaise/fatigue and weight loss.  HENT:  Negative for congestion.   Respiratory:  Positive for cough, sputum production, shortness of breath and wheezing. Negative for hemoptysis.   Cardiovascular:  Negative for chest pain, palpitations and leg swelling.  Neurological:  Positive for headaches. Negative for loss of consciousness.   Physical Exam  BP 128/70   Pulse 93   Ht 5\' 2"  (1.575 m)   Wt 212 lb (96.2 kg)   SpO2 96%   BMI 38.78 kg/m   Physical Exam: General: Well-appearing, no acute distress HENT: [place], AT Eyes: EOMI, no scleral icterus Respiratory: Scattered expiratory wheeze, no rhonchi Cardiovascular: RRR, -M/R/G, no JVD Extremities:-Edema,-tenderness Neuro: AAO x4, CNII-XII grossly intact Skin: Intact, no rashes or bruising Psych: Normal mood, normal affect    Data Reviewed:  PFTs: 05/26/17 FVC 2.38 (72%) FEV1 1.6 (63%) Ratio 63 TLC 105% RV 141% DLCO 55% Interpretation: Moderate obstructive defect with significant bronchodilator response in FEV1. With presence and airtrapping and reduced gas exchange, findings are consistent with emphysema.  Labs: - Allergy profile 03/18/17 >  Eos 0.3 /  IgE  4 neg RAST   Alpha-1 December 05, 2017 normal, MM, 174  Chest imaging: HRCT chest 12/24/2017-no ILD, mild to moderate emphysema CT A/P - right infrahilar enlargement vs overlaying right lower lobe lung nodule. Moderate stool in colon.  Imaging, labs and test noted above have been  reviewed independently by me.  Assessment & Plan:  65 year old female active smoker with COPD and chronic respiratory failure, scoliosis and DDD who presents for follow-up. We reviewed CT imaging and discussed risks and benefits of surveillance vs PET/CT vs tissue sampling. Based on size, character and location, will plan to repeat CT in 6 months to monitor. If lesion enlarges will consider additional imaging including PET/CT +/- bronchoscopy for tissue sampling.  We also discussed compliance with oxygen therapy. She reports AM hypoxemia however ambulatory O2 normal in the office. Will order ONO.  Right middle lobe nodule/pulmonary vein --CT chest without contrast in 6 months (12/2020)  Nocturnal Hypoxemia Patient currently does not have home O2 --Order overnight oximetry --Please call our office after testing is completed so we can request results immediately  COPD  exacerbation --Continue Trelegy --Start prednisone 40 mg daily x 5 days  Health Maintenance Immunization History  Administered Date(s) Administered   Influenza,inj,Quad PF,6+ Mos 10/02/2018, 11/30/2019   Influenza-Unspecified 01/16/2011, 11/26/2011, 12/22/2012, 11/17/2018   PFIZER Comirnaty(Gray Top)Covid-19 Tri-Sucrose Vaccine 05/17/2020   PFIZER(Purple Top)SARS-COV-2 Vaccination 10/20/2019, 11/10/2019   Td 06/07/2005   CT Lung Screen - not qualified  Orders Placed This Encounter  Procedures   CT Chest Wo Contrast    Standing Status:   Future    Standing Expiration Date:   07/25/2021    Scheduling Instructions:     6 months    Order Specific Question:   Preferred imaging location?    Answer:    CT - [address]   Pulse oximetry, overnight    Standing Status:   Future    Standing Expiration Date:   07/25/2021    Scheduling Instructions:     Room air   Meds ordered this encounter  Medications   DISCONTD: predniSONE (STERAPRED UNI-PAK 21 TAB) 5 MG (21) TBPK tablet    Sig: Take by mouth daily.    Dispense:   5 each    Refill:  0   predniSONE (DELTASONE) 20 MG tablet    Sig: Take 1 tablet (20 mg total) by mouth daily with breakfast.    Dispense:  10 tablet    Refill:  0   Return in about 6 months (around 01/24/2021).  I have spent a total time of 39-minutes on the day of the appointment reviewing prior documentation, coordinating care and discussing medical diagnosis and plan with the patient/family. Imaging, labs and tests included in this note have been reviewed and interpreted independently by me.   [name] Rodman Pickle, MD 07/25/2020

## 2020-07-25 NOTE — Patient Instructions (Addendum)
Right middle lobe nodule/pulmonary vein --CT chest without contrast in 6 months (12/2020)  Nocturnal Hypoxemia Patient currently does not have home O2 --Order overnight oximetry --Please call our office after testing is completed so we can request results immediately  COPD exacerbation --Continue Trelegy --Start prednisone 40 mg daily x 5 days  Follow-up with me in 6 months Please call us if sooner appointment needed

## 2020-07-26 ENCOUNTER — Ambulatory Visit (HOSPITAL_COMMUNITY)
Admission: RE | Admit: 2020-07-26 | Discharge: 2020-07-26 | Disposition: A | Discharge status: Home / Self Care | Payer: Medicare HMO | Source: Ambulatory Visit | Attending: Gastroenterology | Admitting: Gastroenterology

## 2020-07-26 DIAGNOSIS — R9389 Abnormal findings on diagnostic imaging of other specified body structures: Secondary | ICD-10-CM | POA: Insufficient documentation

## 2020-07-26 DIAGNOSIS — I517 Cardiomegaly: Secondary | ICD-10-CM | POA: Diagnosis not present

## 2020-07-26 DIAGNOSIS — R911 Solitary pulmonary nodule: Secondary | ICD-10-CM | POA: Diagnosis not present

## 2020-07-26 MED ORDER — IOHEXOL 350 MG/ML SOLN
75.0000 mL | Freq: Once | INTRAVENOUS | Status: AC | PRN
  Administered 2020-07-26: 75 mL via INTRAVENOUS

## 2020-07-27 ENCOUNTER — Encounter: Payer: Self-pay | Admitting: Physician Assistant

## 2020-07-27 MED ORDER — FLUTICASONE PROPIONATE 50 MCG/ACT NA SUSP: 2.0000 | Freq: Every day | NASAL | 6 refills | Status: DC

## 2020-07-28 ENCOUNTER — Telehealth: Payer: Self-pay | Admitting: Pulmonary Disease

## 2020-07-28 ENCOUNTER — Encounter: Payer: Medicare HMO | Attending: Physical Medicine and Rehabilitation | Admitting: Physical Medicine and Rehabilitation

## 2020-07-28 ENCOUNTER — Encounter: Payer: Self-pay | Admitting: Physical Medicine and Rehabilitation

## 2020-07-28 ENCOUNTER — Telehealth: Payer: Self-pay | Admitting: Gastroenterology

## 2020-07-28 ENCOUNTER — Other Ambulatory Visit: Payer: Self-pay

## 2020-07-28 VITALS — BP 137/80 | HR 83 | Temp 98.8°F | Ht 62.5 in | Wt 213.0 lb

## 2020-07-28 DIAGNOSIS — Z5181 Encounter for therapeutic drug level monitoring: Secondary | ICD-10-CM

## 2020-07-28 DIAGNOSIS — G894 Chronic pain syndrome: Secondary | ICD-10-CM | POA: Diagnosis not present

## 2020-07-28 DIAGNOSIS — Z79891 Long term (current) use of opiate analgesic: Secondary | ICD-10-CM | POA: Diagnosis not present

## 2020-07-28 MED ORDER — DULOXETINE HCL 60 MG PO CPEP: 120.0000 mg | ORAL_CAPSULE | Freq: Every day | ORAL | 5 refills | Status: DC

## 2020-07-28 MED ORDER — HYDROCODONE-ACETAMINOPHEN 5-325 MG PO TABS: 1.0000 | ORAL_TABLET | Freq: Four times a day (QID) | ORAL | 0 refills | Status: DC | PRN

## 2020-07-28 NOTE — Patient Instructions (Signed)
Pt is a 65 yr old female with hx of newly dx'd COPD, still smoking, HTN, DM- diet controlled; anxiety and depression;  Scoliosis and deg back issues,  Generalized OA;  chronic pain- here for f/u of chronic pain issues. With new 2.2 cm R lower pulmonary nodule and hemorrhoids.   Suggest calling Pulmonary when they come back from vacation- to discuss possible PET-CT scan so might not need to have biopsy? Flexeril is helpful for muscle tightness - will con't- has 4 more refills.  Con't Norco- 5/325 mg q8 hours prn- #90- due 7/6- will refill- cannot pick up early.  4. Will try increasing Cymbalta- to 120 mg daily- morning or night- but 120 mg- changed to 60 mg pills.  Change Flexeril to to taking every other day.  F/U in 3 months

## 2020-07-28 NOTE — Progress Notes (Signed)
Subjective:    Patient ID: [name], female    DOB: 10/20/1955, 65 y.o.   MRN: [id number]  HPI Pt is a 65 yr old female with hx of newly dx'd COPD, still smoking, HTN, DM- diet controlled; anxiety and depression;  Scoliosis and deg back issues,  Generalized OA;  chronic pain- here for f/u of chronic pain issues.-  Has had 2 CT's in last 1 month- went to GI- due to abdominal pain- radiology called a lung nodule at base of lung. Got CTA and scared, because Pulmonary on vacation.    Still smoking- "can't help it".   Needs more Flexeril- explained has refills.  Is real helpful the days she takes it- 4 days/week.   Keppra made her sleep all the time- So went back to Cymbalta.    Wiped this AM and was red- has hemorrhoids-  Was told by GI, no issues anatomically.   Felt O2 was a safety hazard- was getting tangled up in it- hadn't used in 1 year- monitoring O2 sats- before bed and when wakes up- 91-92%- in AM 82-88%.  Has to do pulmonary function test- per Pulmonary.  Wakes up "stupid"- 1 time took night meds in AM. Also Has taken double dose of Cymbalta.      Pain Inventory Average Pain 8 Pain Right Now 7 My pain is burning, stabbing, tingling, and aching  In the last 24 hours, has pain interfered with the following? General activity 9 Relation with others 7 Enjoyment of life 7 What TIME of day is your pain at its worst? morning  Sleep (in general) Fair  Pain is worse with: walking, bending, and standing Pain improves with: rest, pacing activities, medication, and injections Relief from Meds: 8  Family History  Problem Relation Age of Onset   COPD Mother    Heart disease Mother    Lung disease Father        Asbestosis   Heart attack Father    Heart disease Father    Cerebral aneurysm Brother    Aneurysm Brother        Brain   Drug abuse Daughter    Epilepsy Son    Alcohol abuse Son    Drug abuse Son    Arthritis Maternal Grandmother    Heart disease  Maternal Grandmother    Asthma Maternal Grandfather    Cancer Maternal Grandfather    Arthritis Paternal Grandmother    Heart disease Paternal Grandmother    Stroke Paternal Grandmother    Early death Paternal Grandfather    Heart disease Paternal Grandfather    Social History   Socioeconomic History   Marital status: Married    Spouse name: Not on file   Number of children: 2   Years of education: Not on file   Highest education level: Not on file  Occupational History   Occupation: disabled  Tobacco Use   Smoking status: Every Day    Packs/day: 1.50    Years: 46.00    Pack years: 69.00    Types: Cigarettes   Smokeless tobacco: Never   Tobacco comments:    tobacco info given  Vaping Use   Vaping Use: Never used  Substance and Sexual Activity   Alcohol use: No   Drug use: No   Sexual activity: Not Currently    Partners: Male  Other Topics Concern   Not on file  Social History Narrative   Right handed    Caffeine~ 2 cups per day  Lives at home with husband (strained relationship)   Primary caretaker for disabled brother who had aneurism   Daughter died 07-04-2018    Social Determinants of Health   Financial Resource Strain: Low Risk    Difficulty of Paying Living Expenses: Not hard at all  Food Insecurity: No Food Insecurity   Worried About Charity fundraiser in the Last Year: Never true   Arboriculturist in the Last Year: Never true  Transportation Needs: No Transportation Needs   Lack of Transportation (Medical): No   Lack of Transportation (Non-Medical): No  Physical Activity: Inactive   Days of Exercise per Week: 0 days   Minutes of Exercise per Session: 0 min  Stress: Stress Concern Present   Feeling of Stress : To some extent  Social Connections: Moderately Integrated   Frequency of Communication with Friends and Family: More than three times a week   Frequency of Social Gatherings with Friends and Family: Once a week   Attends Religious Services: 1  to 4 times per year   Active Member of [employer] or Organizations: No   Attends Archivist Meetings: Never   Marital Status: Married   Past Surgical History:  Procedure Laterality Date   APPENDECTOMY     1985   BIOPSY  07/24/2017   Procedure: BIOPSY;  Surgeon: Milus Banister, MD;  Location: [facility];  Service: Endoscopy;;   CARDIAC CATHETERIZATION N/A 10/31/2015   Procedure: Left Heart Cath and Coronary Angiography;  Surgeon: Leonie Man, MD;  Location: [facility];  Service: Cardiovascular;  Laterality: N/A;   CARPAL TUNNEL RELEASE Left    CARPAL TUNNEL RELEASE Right    CHOLECYSTECTOMY  late 1980's   COLONOSCOPY WITH PROPOFOL N/A 07/24/2017   Procedure: COLONOSCOPY WITH PROPOFOL;  Surgeon: Milus Banister, MD;  Location: [facility];  Service: Endoscopy;  Laterality: N/A;   ESOPHAGOGASTRODUODENOSCOPY N/A 07/24/2017   Procedure: ESOPHAGOGASTRODUODENOSCOPY (EGD);  Surgeon: Milus Banister, MD;  Location: [facility];  Service: Endoscopy;  Laterality: N/A;   GALLBLADDER SURGERY  1991   HIP CLOSED REDUCTION Right 01/08/2016   Procedure: CLOSED MANIPULATION HIP;  Surgeon: Susa Day, MD;  Location: [facility];  Service: Orthopedics;  Laterality: Right;   HIP CLOSED REDUCTION Right 01/19/2016   Procedure: ATTEMPTED CLOSED REDUCTION RIGHT HIP;  Surgeon: Wylene Simmer, MD;  Location: [facility];  Service: Orthopedics;  Laterality: Right;   HIP CLOSED REDUCTION Right 01/20/2016   Procedure: CLOSED REDUCTION RIGHT TOTAL HIP;  Surgeon: Paralee Cancel, MD;  Location: [facility];  Service: Orthopedics;  Laterality: Right;   HIP CLOSED REDUCTION Right 02/17/2016   Procedure: CLOSED REDUCTION RIGHT TOTAL HIP;  Surgeon: Rod Can, MD;  Location: [facility];  Service: Orthopedics;  Laterality: Right;   HIP CLOSED REDUCTION Right 02/28/2016   Procedure: CLOSED REDUCTION HIP;  Surgeon: Nicholes Stairs, MD;  Location: [facility];  Service: Orthopedics;  Laterality: Right;   POLYPECTOMY   07/24/2017   Procedure: POLYPECTOMY;  Surgeon: Milus Banister, MD;  Location: [facility];  Service: Endoscopy;;   [place], with 1 ovary removed and 2 nd ovary removed 2003   TOTAL HIP ARTHROPLASTY Right    Original surgery 2006 with revision 2010   TOTAL HIP REVISION Right 01/01/2016   Procedure: TOTAL HIP REVISION;  Surgeon: Paralee Cancel, MD;  Location: [facility];  Service: Orthopedics;  Laterality: Right;   TOTAL HIP REVISION  Right 03/02/2016   Procedure: TOTAL HIP REVISION;  Surgeon: Paralee Cancel, MD;  Location: [facility];  Service: Orthopedics;  Laterality: Right;   TOTAL HIP REVISION Right 09/02/2016   Procedure: Right hip constrained liner- posterior;  Surgeon: Paralee Cancel, MD;  Location: [facility];  Service: Orthopedics;  Laterality: Right;   ULNAR NERVE TRANSPOSITION Right    Past Surgical History:  Procedure Laterality Date   APPENDECTOMY     1985   BIOPSY  07/24/2017   Procedure: BIOPSY;  Surgeon: Milus Banister, MD;  Location: [facility];  Service: Endoscopy;;   CARDIAC CATHETERIZATION N/A 10/31/2015   Procedure: Left Heart Cath and Coronary Angiography;  Surgeon: Leonie Man, MD;  Location: [facility];  Service: Cardiovascular;  Laterality: N/A;   CARPAL TUNNEL RELEASE Left    CARPAL TUNNEL RELEASE Right    CHOLECYSTECTOMY  late 1980's   COLONOSCOPY WITH PROPOFOL N/A 07/24/2017   Procedure: COLONOSCOPY WITH PROPOFOL;  Surgeon: Milus Banister, MD;  Location: [facility];  Service: Endoscopy;  Laterality: N/A;   ESOPHAGOGASTRODUODENOSCOPY N/A 07/24/2017   Procedure: ESOPHAGOGASTRODUODENOSCOPY (EGD);  Surgeon: Milus Banister, MD;  Location: [facility];  Service: Endoscopy;  Laterality: N/A;   GALLBLADDER SURGERY  1991   HIP CLOSED REDUCTION Right 01/08/2016   Procedure: CLOSED MANIPULATION HIP;  Surgeon: Susa Day, MD;  Location: [facility];  Service: Orthopedics;  Laterality: Right;   HIP CLOSED REDUCTION Right  01/19/2016   Procedure: ATTEMPTED CLOSED REDUCTION RIGHT HIP;  Surgeon: Wylene Simmer, MD;  Location: [facility];  Service: Orthopedics;  Laterality: Right;   HIP CLOSED REDUCTION Right 01/20/2016   Procedure: CLOSED REDUCTION RIGHT TOTAL HIP;  Surgeon: Paralee Cancel, MD;  Location: [facility];  Service: Orthopedics;  Laterality: Right;   HIP CLOSED REDUCTION Right 02/17/2016   Procedure: CLOSED REDUCTION RIGHT TOTAL HIP;  Surgeon: Rod Can, MD;  Location: [facility];  Service: Orthopedics;  Laterality: Right;   HIP CLOSED REDUCTION Right 02/28/2016   Procedure: CLOSED REDUCTION HIP;  Surgeon: Nicholes Stairs, MD;  Location: [facility];  Service: Orthopedics;  Laterality: Right;   POLYPECTOMY  07/24/2017   Procedure: POLYPECTOMY;  Surgeon: Milus Banister, MD;  Location: [facility];  Service: Endoscopy;;   [place], with 1 ovary removed and 2 nd ovary removed 2003   TOTAL HIP ARTHROPLASTY Right    Original surgery 2006 with revision 2010   TOTAL HIP REVISION Right 01/01/2016   Procedure: TOTAL HIP REVISION;  Surgeon: Paralee Cancel, MD;  Location: [facility];  Service: Orthopedics;  Laterality: Right;   TOTAL HIP REVISION Right 03/02/2016   Procedure: TOTAL HIP REVISION;  Surgeon: Paralee Cancel, MD;  Location: [facility];  Service: Orthopedics;  Laterality: Right;   TOTAL HIP REVISION Right 09/02/2016   Procedure: Right hip constrained liner- posterior;  Surgeon: Paralee Cancel, MD;  Location: [facility];  Service: Orthopedics;  Laterality: Right;   ULNAR NERVE TRANSPOSITION Right    Past Medical History:  Diagnosis Date   Allergic rhinitis    Anemia    Anxiety    Chicken pox    Chronic back pain    COPD (chronic obstructive pulmonary disease) (HCC)    Depression    DM (diabetes mellitus) ([place])    Essential hypertension    GERD (gastroesophageal reflux disease)    Headache    migraines   History of gastritis    EGD 2015   History  of home oxygen therapy    2  liters at hs last 6 months   Hyperlipidemia    Hypothyroidism    Migraines    Osteoarthritis    oa   Scoliosis    BP 137/80   Pulse 83   Temp 98.8 F (37.1 C)   Ht 5' 2.5" (1.588 m)   Wt 213 lb (96.6 kg)   SpO2 95%   BMI 38.34 kg/m   Opioid Risk Score:   Fall Risk Score:  `1  Depression screen PHQ 2/9  Depression screen [facility] 2/9 04/10/2020 12/27/2019 11/30/2019 08/11/2019 07/27/2019 04/12/2019 12/16/2018  Decreased Interest 2 0 2 3 3 1 2   Down, Depressed, Hopeless 1 2 1 3 3 1 3   PHQ - 2 Score 3 2 3 6 6 2 5   Altered sleeping 0 1 0 3 3 0 0  Tired, decreased energy 2 1 2 3 3  0 3  Change in appetite 0 1 0 3 3 0 2  Feeling bad or failure about yourself  1 1 0 3 3 0 2  Trouble concentrating 1 0 0 3 3 0 1  Moving slowly or fidgety/restless 0 0 0 3 3 0 0  Suicidal thoughts 0 0 0 - 0 0 0  PHQ-9 Score 7 6 5 24 24 2 13   Difficult doing work/chores - Somewhat difficult Somewhat difficult - Very difficult Somewhat difficult Somewhat difficult  Some recent data might be hidden    Review of Systems  Constitutional: Negative.   HENT: Negative.    Eyes: Negative.   Respiratory: Negative.    Cardiovascular: Negative.   Gastrointestinal: Negative.   Endocrine: Negative.   Genitourinary: Negative.   Musculoskeletal:  Positive for arthralgias and back pain.  Skin: Negative.   Allergic/Immunologic: Negative.   Neurological: Negative.        Tingling   Hematological: Negative.   Psychiatric/Behavioral: Negative.    All other systems reviewed and are negative.     Objective:   Physical Exam  Awake, alert, appropriate, sitting on table- nervous, anxious about Pulmonary nodule, NAD Has TTP midline and R side back- with palpable trigger points on R paraspinals.       Assessment & Plan:   Pt is a 65 yr old female with hx of newly dx'd COPD, still smoking, HTN, DM- diet controlled; anxiety and depression;  Scoliosis and deg back issues,  Generalized OA;  chronic pain- here for f/u  of chronic pain issues. With new 2.2 cm R lower pulmonary nodule and hemorrhoids.   Suggest calling Pulmonary when they come back from vacation- to discuss possible PET-CT scan so might not need to have biopsy? Flexeril is helpful for muscle tightness - will con't- has 4 more refills.  Con't Norco- 5/325 mg q8 hours prn- #90- due 7/6- will refill- cannot pick up early.  4. Will try increasing Cymbalta- to 120 mg daily- morning or night- but 120 mg- changed to 60 mg pills.  Change Flexeril to to taking every other day.  F/U in 3 months

## 2020-07-28 NOTE — Telephone Encounter (Signed)
Pt needs results of CT scan. She stated that she can see it in my chart.

## 2020-07-28 NOTE — Telephone Encounter (Signed)
I just responded to this via mychart message and I forwarded it to her Pulm MD for their help.  I am out of my depth with this lung nodule.  Thanks

## 2020-07-28 NOTE — Telephone Encounter (Signed)
I left a message for the patient to please check her MyChart that Dr. Ardis Hughs sent her a results note on the CT

## 2020-07-28 NOTE — Telephone Encounter (Signed)
Dr. Ardis Hughs please see CT notes and advise if you would like to proceed with PET-CT for pulmonary nodules.

## 2020-07-28 NOTE — Telephone Encounter (Signed)
Spoke to patient, who is questioning if Dr. Loanne Drilling reviewed CTA that was preformed on 6/30/222. Patient is aware that Dr. Loanne Drilling is unavailable until 08/02/2020. She is okay with waiting for response.    Dr. Loanne Drilling, please advise. Thanks

## 2020-07-29 DIAGNOSIS — J449 Chronic obstructive pulmonary disease, unspecified: Secondary | ICD-10-CM | POA: Diagnosis not present

## 2020-08-02 ENCOUNTER — Other Ambulatory Visit: Payer: Self-pay | Admitting: Physical Medicine and Rehabilitation

## 2020-08-02 DIAGNOSIS — R911 Solitary pulmonary nodule: Secondary | ICD-10-CM | POA: Insufficient documentation

## 2020-08-02 DIAGNOSIS — G894 Chronic pain syndrome: Secondary | ICD-10-CM

## 2020-08-02 NOTE — Telephone Encounter (Signed)
PMP was Reviewed: Dr Dagoberto Ligas note was reviewed. [name] has been on Hydrocodone 10/325 #90 since 05/2019. Hydrocodone e-scribed today.

## 2020-08-02 NOTE — Telephone Encounter (Signed)
Called patient and responded to message with her PCP, Dr. Ardis Hughs. Discussed PET/CT vs biopsy vs surveillance. RLL lung lesions likely lung nodule. Will plan for repeat CT chest without contrast in December 2022. Will consider PET/CT +/- bronchoscopy pending results. Patient expressed understanding and agrees with plan.

## 2020-08-05 LAB — TOXASSURE SELECT,+ANTIDEPR,UR

## 2020-08-13 ENCOUNTER — Other Ambulatory Visit: Payer: Self-pay | Admitting: Family Medicine

## 2020-08-14 MED ORDER — ALPRAZOLAM 1 MG PO TABS: ORAL_TABLET | ORAL | 0 refills | Status: DC

## 2020-08-16 DIAGNOSIS — M5459 Other low back pain: Secondary | ICD-10-CM | POA: Diagnosis not present

## 2020-08-18 ENCOUNTER — Telehealth: Payer: Self-pay | Admitting: *Deleted

## 2020-08-18 NOTE — Telephone Encounter (Signed)
Urine drug screen for this encounter is consistent for prescribed medication 

## 2020-08-21 ENCOUNTER — Telehealth: Payer: Self-pay

## 2020-08-21 ENCOUNTER — Other Ambulatory Visit: Payer: Self-pay

## 2020-08-21 DIAGNOSIS — R609 Edema, unspecified: Secondary | ICD-10-CM

## 2020-08-21 MED ORDER — POTASSIUM CHLORIDE CRYS ER 10 MEQ PO TBCR: 10.0000 meq | EXTENDED_RELEASE_TABLET | Freq: Every day | ORAL | 1 refills | Status: DC

## 2020-08-21 NOTE — Telephone Encounter (Signed)
Rx sent in

## 2020-08-21 NOTE — Telephone Encounter (Signed)
MEDICATION: potassium chloride (KLOR-CON) 10 MEQ tablet  PHARMACY:  [facility] - [address], [place] Phone:  [phone]  Fax:  [phone]      Comments:   **Let patient know to contact pharmacy at the end of the day to make sure medication is ready. **  ** Please notify patient to allow 48-72 hours to process**  **Encourage patient to contact the pharmacy for refills or they can request refills through [facility]**

## 2020-08-23 ENCOUNTER — Other Ambulatory Visit: Payer: Self-pay

## 2020-08-23 DIAGNOSIS — E785 Hyperlipidemia, unspecified: Secondary | ICD-10-CM

## 2020-08-23 MED ORDER — ATORVASTATIN CALCIUM 20 MG PO TABS: 20.0000 mg | ORAL_TABLET | Freq: Every day | ORAL | 1 refills | Status: DC

## 2020-08-24 DIAGNOSIS — R0602 Shortness of breath: Secondary | ICD-10-CM | POA: Diagnosis not present

## 2020-08-29 DIAGNOSIS — J449 Chronic obstructive pulmonary disease, unspecified: Secondary | ICD-10-CM | POA: Diagnosis not present

## 2020-09-03 ENCOUNTER — Other Ambulatory Visit: Payer: Self-pay | Admitting: Nurse Practitioner

## 2020-09-04 ENCOUNTER — Other Ambulatory Visit: Payer: Self-pay

## 2020-09-04 ENCOUNTER — Encounter: Payer: Self-pay | Admitting: Physician Assistant

## 2020-09-04 ENCOUNTER — Ambulatory Visit (INDEPENDENT_AMBULATORY_CARE_PROVIDER_SITE_OTHER): Payer: Medicare HMO | Admitting: Physician Assistant

## 2020-09-04 ENCOUNTER — Telehealth: Payer: Self-pay | Admitting: Pulmonary Disease

## 2020-09-04 VITALS — BP 117/72 | HR 76 | Temp 97.2°F | Ht 62.5 in | Wt 212.2 lb

## 2020-09-04 DIAGNOSIS — F32A Depression, unspecified: Secondary | ICD-10-CM

## 2020-09-04 DIAGNOSIS — G894 Chronic pain syndrome: Secondary | ICD-10-CM

## 2020-09-04 DIAGNOSIS — Z23 Encounter for immunization: Secondary | ICD-10-CM | POA: Diagnosis not present

## 2020-09-04 DIAGNOSIS — R69 Illness, unspecified: Secondary | ICD-10-CM | POA: Diagnosis not present

## 2020-09-04 DIAGNOSIS — E038 Other specified hypothyroidism: Secondary | ICD-10-CM | POA: Diagnosis not present

## 2020-09-04 DIAGNOSIS — R079 Chest pain, unspecified: Secondary | ICD-10-CM

## 2020-09-04 DIAGNOSIS — F419 Anxiety disorder, unspecified: Secondary | ICD-10-CM | POA: Diagnosis not present

## 2020-09-04 DIAGNOSIS — E1165 Type 2 diabetes mellitus with hyperglycemia: Secondary | ICD-10-CM | POA: Diagnosis not present

## 2020-09-04 DIAGNOSIS — G4734 Idiopathic sleep related nonobstructive alveolar hypoventilation: Secondary | ICD-10-CM

## 2020-09-04 NOTE — Progress Notes (Signed)
Established Patient Office Visit  Subjective:  Patient ID: [name], female    DOB: 01-Oct-1955  Age: 65 y.o. MRN: [id number]  CC:  Chief Complaint  Patient presents with   Follow-up    HPI [name] presents for follow-up of several issues. See A/P.   Past Medical History:  Diagnosis Date   Allergic rhinitis    Anemia    Anxiety    Chicken pox    Chronic back pain    COPD (chronic obstructive pulmonary disease) (HCC)    Depression    DM (diabetes mellitus) ([place])    Essential hypertension    GERD (gastroesophageal reflux disease)    Headache    migraines   History of gastritis    EGD 2015   History of home oxygen therapy    2 liters at hs last 6 months   Hyperlipidemia    Hypothyroidism    Migraines    Osteoarthritis    oa   Scoliosis     Past Surgical History:  Procedure Laterality Date   APPENDECTOMY     1985   BIOPSY  07/24/2017   Procedure: BIOPSY;  Surgeon: Milus Banister, MD;  Location: [facility];  Service: Endoscopy;;   CARDIAC CATHETERIZATION N/A 10/31/2015   Procedure: Left Heart Cath and Coronary Angiography;  Surgeon: Leonie Man, MD;  Location: [facility];  Service: Cardiovascular;  Laterality: N/A;   CARPAL TUNNEL RELEASE Left    CARPAL TUNNEL RELEASE Right    CHOLECYSTECTOMY  late 1980's   COLONOSCOPY WITH PROPOFOL N/A 07/24/2017   Procedure: COLONOSCOPY WITH PROPOFOL;  Surgeon: Milus Banister, MD;  Location: [facility];  Service: Endoscopy;  Laterality: N/A;   ESOPHAGOGASTRODUODENOSCOPY N/A 07/24/2017   Procedure: ESOPHAGOGASTRODUODENOSCOPY (EGD);  Surgeon: Milus Banister, MD;  Location: [facility];  Service: Endoscopy;  Laterality: N/A;   GALLBLADDER SURGERY  1991   HIP CLOSED REDUCTION Right 01/08/2016   Procedure: CLOSED MANIPULATION HIP;  Surgeon: Susa Day, MD;  Location: [facility];  Service: Orthopedics;  Laterality: Right;   HIP CLOSED REDUCTION Right 01/19/2016   Procedure: ATTEMPTED CLOSED REDUCTION  RIGHT HIP;  Surgeon: Wylene Simmer, MD;  Location: [facility];  Service: Orthopedics;  Laterality: Right;   HIP CLOSED REDUCTION Right 01/20/2016   Procedure: CLOSED REDUCTION RIGHT TOTAL HIP;  Surgeon: Paralee Cancel, MD;  Location: [facility];  Service: Orthopedics;  Laterality: Right;   HIP CLOSED REDUCTION Right 02/17/2016   Procedure: CLOSED REDUCTION RIGHT TOTAL HIP;  Surgeon: Rod Can, MD;  Location: [facility];  Service: Orthopedics;  Laterality: Right;   HIP CLOSED REDUCTION Right 02/28/2016   Procedure: CLOSED REDUCTION HIP;  Surgeon: Nicholes Stairs, MD;  Location: [facility];  Service: Orthopedics;  Laterality: Right;   POLYPECTOMY  07/24/2017   Procedure: POLYPECTOMY;  Surgeon: Milus Banister, MD;  Location: [facility];  Service: Endoscopy;;   [place], with 1 ovary removed and 2 nd ovary removed 2003   TOTAL HIP ARTHROPLASTY Right    Original surgery 2006 with revision 2010   TOTAL HIP REVISION Right 01/01/2016   Procedure: TOTAL HIP REVISION;  Surgeon: Paralee Cancel, MD;  Location: [facility];  Service: Orthopedics;  Laterality: Right;   TOTAL HIP REVISION Right 03/02/2016   Procedure: TOTAL HIP REVISION;  Surgeon: Paralee Cancel, MD;  Location: [facility];  Service: Orthopedics;  Laterality: Right;   TOTAL HIP REVISION Right 09/02/2016  Procedure: Right hip constrained liner- posterior;  Surgeon: Paralee Cancel, MD;  Location: [facility];  Service: Orthopedics;  Laterality: Right;   ULNAR NERVE TRANSPOSITION Right     Family History  Problem Relation Age of Onset   COPD Mother    Heart disease Mother    Lung disease Father        Asbestosis   Heart attack Father    Heart disease Father    Cerebral aneurysm Brother    Aneurysm Brother        Brain   Drug abuse Daughter    Epilepsy Son    Alcohol abuse Son    Drug abuse Son    Arthritis Maternal Grandmother    Heart disease Maternal Grandmother    Asthma Maternal Grandfather    Cancer Maternal  Grandfather    Arthritis Paternal Grandmother    Heart disease Paternal Grandmother    Stroke Paternal Grandmother    Early death Paternal Grandfather    Heart disease Paternal Grandfather     Social History   Socioeconomic History   Marital status: Married    Spouse name: Not on file   Number of children: 2   Years of education: Not on file   Highest education level: Not on file  Occupational History   Occupation: disabled  Tobacco Use   Smoking status: Every Day    Packs/day: 1.50    Years: 46.00    Pack years: 69.00    Types: Cigarettes   Smokeless tobacco: Never   Tobacco comments:    tobacco info given  Vaping Use   Vaping Use: Never used  Substance and Sexual Activity   Alcohol use: No   Drug use: No   Sexual activity: Not Currently    Partners: Male  Other Topics Concern   Not on file  Social History Narrative   Right handed    Caffeine~ 2 cups per day    Lives at home with husband (strained relationship)   Primary caretaker for disabled brother who had aneurism   Daughter died 07-06-2018    Social Determinants of Radio broadcast assistant Strain: Low Risk    Difficulty of Paying Living Expenses: Not hard at all  Food Insecurity: No Food Insecurity   Worried About Charity fundraiser in the Last Year: Never true   Arboriculturist in the Last Year: Never true  Transportation Needs: No Transportation Needs   Lack of Transportation (Medical): No   Lack of Transportation (Non-Medical): No  Physical Activity: Inactive   Days of Exercise per Week: 0 days   Minutes of Exercise per Session: 0 min  Stress: Stress Concern Present   Feeling of Stress : To some extent  Social Connections: Moderately Integrated   Frequency of Communication with Friends and Family: More than three times a week   Frequency of Social Gatherings with Friends and Family: Once a week   Attends Religious Services: 1 to 4 times per year   Active Member of [employer] or Organizations: No    Attends Archivist Meetings: Never   Marital Status: Married  Human resources officer Violence: Not At Risk   Fear of Current or Ex-Partner: No   Emotionally Abused: No   Physically Abused: No   Sexually Abused: No    Outpatient Medications Prior to Visit  Medication Sig Dispense Refill   Accu-Chek Softclix Lancets lancets 4 (four) times daily.     albuterol (PROAIR HFA) 108 (90  Base) MCG/ACT inhaler 2 puffs every 4 hours as needed only  if your can't catch your breath 18 g 3   ALPRAZolam (XANAX) 1 MG tablet Take 1 tablet po BID as needed for anxiety. 60 tablet 0   Aspirin-Acetaminophen-Caffeine (GOODY HEADACHE PO) Take 1 tablet by mouth as needed. As needed     atorvastatin (LIPITOR) 20 MG tablet Take 1 tablet (20 mg total) by mouth at bedtime. 90 tablet 1   cyclobenzaprine (FLEXERIL) 10 MG tablet Take 1 tablet (10 mg total) by mouth 3 (three) times daily as needed for muscle spasms. 90 tablet 5   dicyclomine (BENTYL) 10 MG capsule Take 1 capsule (10 mg total) by mouth 2 (two) times daily as needed for spasms. 30 capsule 0   DULoxetine (CYMBALTA) 60 MG capsule Take 2 capsules (120 mg total) by mouth daily. 60 capsule 5   fluticasone (FLONASE) 50 MCG/ACT nasal spray Place 2 sprays into both nostrils daily. 16 g 6   Fluticasone-Umeclidin-Vilant (TRELEGY ELLIPTA) 100-62.5-25 MCG/INH AEPB Inhale 1 puff into the lungs daily. 60 each 6   furosemide (LASIX) 20 MG tablet TAKE 1 TABLET BY MOUTH EVERY DAY AS NEEDED 30 tablet 90   glucose blood test strip Use as instructed 300 each 11   levothyroxine (SYNTHROID) 150 MCG tablet Take 1 tablet (150 mcg total) by mouth daily before breakfast. 30 tablet 5   loperamide (IMODIUM A-D) 2 MG tablet Take 1 tablet (2 mg total) by mouth in the morning and at bedtime. 60 tablet 3   loratadine (CLARITIN) 10 MG tablet Take 10 mg by mouth daily.     omeprazole (PRILOSEC) 40 MG capsule TAKE ONE CAPSULE BY MOUTH TWICE DAILY 60 capsule 2   ondansetron (ZOFRAN) 4 MG  tablet Take 1 tablet (4 mg total) by mouth as needed for nausea. 50 tablet 6   potassium chloride (KLOR-CON) 10 MEQ tablet Take 1 tablet (10 mEq total) by mouth at bedtime. 90 tablet 1   triamterene-hydrochlorothiazide (MAXZIDE-25) 37.5-25 MG tablet Take 1 tablet by mouth daily. 90 tablet 1   HYDROcodone-acetaminophen (NORCO) 10-325 MG tablet TAKE 1 TABLET BY MOUTH EVERY 8 HOURS AS NEEDED 90 tablet 0   predniSONE (DELTASONE) 20 MG tablet Take 1 tablet (20 mg total) by mouth daily with breakfast. 10 tablet 0   No facility-administered medications prior to visit.    Allergies  Allergen Reactions   Metformin And Related Diarrhea   Nsaids Diarrhea   Aleve [Naproxen Sodium] Other (See Comments)    Headache    Codeine Nausea Only and Other (See Comments)    GI upset   Penicillins Nausea Only and Other (See Comments)    GI upset    Sulfonamide Derivatives Hives    ROS Review of Systems  Constitutional:  Negative for activity change and unexpected weight change (difficulty losing weight).  Respiratory:  Negative for wheezing.   Cardiovascular:  Negative for chest pain.  Musculoskeletal:  Positive for arthralgias and back pain.  Neurological:  Negative for dizziness.  Psychiatric/Behavioral:  Positive for dysphoric mood and sleep disturbance. The patient is nervous/anxious.      Objective:    Physical Exam Vitals and nursing note reviewed.  Constitutional:      Appearance: Normal appearance. She is obese. She is not toxic-appearing.  HENT:     Head: Normocephalic and atraumatic.     Right Ear: External ear normal.     Left Ear: External ear normal.     Nose: Nose normal.  Mouth/Throat:     Mouth: Mucous membranes are moist.  Eyes:     Extraocular Movements: Extraocular movements intact.     Conjunctiva/sclera: Conjunctivae normal.     Pupils: Pupils are equal, round, and reactive to light.  Cardiovascular:     Rate and Rhythm: Normal rate and regular rhythm.     Pulses:  Normal pulses.  Pulmonary:     Effort: Pulmonary effort is normal.  Musculoskeletal:        General: Normal range of motion.     Cervical back: Normal range of motion and neck supple.  Skin:    General: Skin is warm and dry.  Neurological:     General: No focal deficit present.     Mental Status: She is alert and oriented to person, place, and time.  Psychiatric:        Mood and Affect: Mood is depressed. Affect is tearful.        Behavior: Behavior normal.        Thought Content: Thought content normal.        Judgment: Judgment normal.    BP 117/72   Pulse 76   Temp (!) 97.2 F (36.2 C)   Ht 5' 2.5" (1.588 m)   Wt 212 lb 3.2 oz (96.3 kg)   SpO2 98%   BMI 38.19 kg/m  Wt Readings from Last 3 Encounters:  09/05/20 213 lb (96.6 kg)  09/04/20 212 lb 3.2 oz (96.3 kg)  07/28/20 213 lb (96.6 kg)    Health Maintenance Due  Topic Date Due   Pneumococcal Vaccine 55-33 Years old (1 - PCV) Never done   FOOT EXAM  Never done   OPHTHALMOLOGY EXAM  Never done   HIV Screening  Never done   Hepatitis C Screening  Never done   Zoster Vaccines- Shingrix (1 of 2) Never done   MAMMOGRAM  05/28/2019   URINE MICROALBUMIN  03/11/2020   COVID-19 Vaccine (4 - Booster for Pfizer series) 08/16/2020   INFLUENZA VACCINE  08/28/2020    There are no preventive care reminders to display for this patient.  Lab Results  Component Value Date   TSH 0.43 09/04/2020   Lab Results  Component Value Date   WBC 10.1 05/25/2020   HGB 13.8 05/25/2020   HCT 41.8 05/25/2020   MCV 82.5 05/25/2020   PLT 203.0 05/25/2020   Lab Results  Component Value Date   NA 138 06/28/2020   K 4.0 06/28/2020   CHLORIDE 110 (H) 06/30/2014   CO2 24 06/28/2020   GLUCOSE 78 06/28/2020   BUN 12 06/28/2020   CREATININE 0.98 06/28/2020   BILITOT 0.5 05/25/2020   ALKPHOS 100 05/25/2020   AST 22 05/25/2020   ALT 19 05/25/2020   PROT 7.3 05/25/2020   ALBUMIN 4.0 05/25/2020   CALCIUM 9.1 06/28/2020   ANIONGAP 8  07/07/2019   EGFR 60 (L) 06/30/2014   GFR 61.04 06/28/2020   Lab Results  Component Value Date   CHOL 98 11/30/2019   Lab Results  Component Value Date   HDL 31.90 (L) 11/30/2019   Lab Results  Component Value Date   LDLCALC 27 11/30/2019   Lab Results  Component Value Date   TRIG 193.0 (H) 11/30/2019   Lab Results  Component Value Date   CHOLHDL 3 11/30/2019   Lab Results  Component Value Date   HGBA1C 6.7 (H) 09/04/2020      Assessment & Plan:   Problem List Items Addressed This Visit  Endocrine   Hypothyroidism   Relevant Orders   TSH (Completed)     Other   Anxiety and depression - Primary   Chronic pain syndrome   Other Visit Diagnoses     Type 2 diabetes mellitus with hyperglycemia, without long-term current use of insulin ([place])       Relevant Orders   Hemoglobin A1c (Completed)   Need for pneumococcal vaccine       Relevant Orders   Pneumococcal conjugate vaccine 20-valent (Prevnar 20) (Completed)      No orders of the defined types were placed in this encounter.   Follow-up: Return in about 4 months (around 01/04/2021) for med check .   1. Anxiety and depression She is currently stable.  PDMP reviewed today and no red flags found.  She continues to take Xanax 1 mg twice daily, which she feels is the perfect dose for her.  She is also taking duloxetine 120 mg daily.  Dr. Dagoberto Ligas is helping her with this as well.  2. Type 2 diabetes mellitus with hyperglycemia, without long-term current use of insulin (HCC) Currently diet controlled only.  She says that she has been watching her sugars at home still and has a log with her for me to review.  Her sugars are averaging 120s or less.  We will recheck a hemoglobin A1c today.  3. Chronic pain syndrome She continues to follow-up with Dr. Dagoberto Ligas.  She is taking Norco 10-325 mg 3 times daily and thinks that this does help with her pain.  She is also taking Flexeril 10 mg 3 times daily.  4. Other  specified hypothyroidism Her TSH level has not been checked in a while and patient states that she is taking levothyroxine 150 mcg.  We will check this level today to confirm that she still is on the correct dose.  5. Need for pneumococcal vaccine She is at high risk given her respiratory history of COPD.  Prevnar 20 given to her in the office today  This note was prepared with assistance of Systems analyst. Occasional wrong-word or sound-a-like substitutions may have occurred due to the inherent limitations of voice recognition software.  Total time spent with patient reviewing history and physical, most recent labs and medications, and plan as well as documentation today was 37 minutes.   Yoshito Gaza M Ignacio Lowder, PA-C

## 2020-09-04 NOTE — Telephone Encounter (Signed)
LMTCB  Call made to [facility], spoke with [name]. They are going to refax ONO study. Fax number confirmed. She states it was faxed on 7/20. I made her aware we are unable to locate. Voiced understanding.

## 2020-09-04 NOTE — Telephone Encounter (Addendum)
Pt is requesting the results from her overnight oximetry stated that it has been ~ 3 wks since she did it and has not heard anything back just yet. Pls regard; [phone]  **Pt states that she does have a few appts this afternoon so she may not be able to answer after 1 p.m.** She stated you are able to leave a voicemail in regards to results.

## 2020-09-05 ENCOUNTER — Ambulatory Visit: Payer: Medicare HMO | Admitting: Gastroenterology

## 2020-09-05 ENCOUNTER — Encounter: Payer: Self-pay | Admitting: Gastroenterology

## 2020-09-05 VITALS — BP 130/70 | HR 80 | Ht 62.5 in | Wt 213.0 lb

## 2020-09-05 DIAGNOSIS — K589 Irritable bowel syndrome without diarrhea: Secondary | ICD-10-CM

## 2020-09-05 LAB — TSH: TSH: 0.43 u[IU]/mL (ref 0.35–5.50)

## 2020-09-05 LAB — HEMOGLOBIN A1C: Hgb A1c MFr Bld: 6.7 % — ABNORMAL HIGH (ref 4.6–6.5)

## 2020-09-05 MED ORDER — HYDROCODONE-ACETAMINOPHEN 10-325 MG PO TABS: 1.0000 | ORAL_TABLET | Freq: Three times a day (TID) | ORAL | 0 refills | Status: DC | PRN

## 2020-09-05 NOTE — Progress Notes (Signed)
Review of pertinent gastrointestinal problems: 1.  Adenomatous colon polyp.  Colonoscopy June 2019 for iron deficiency anemia found a single subcentimeter adenoma. 2.  EGD June 2019 for iron deficiency anemia showed evidence of previous peptic ulcer disease in the prepyloric region, mild gastritis was negative for H. pylori.  I recommended repeat CBC in 2 months and that she strictly avoid NSAIDs especially Goody powders.  3.  Lower abdominal pains.  2022.  Normal CBC, normal complete metabolic profile.  June 2022 CT scan abdomen pelvis with IV contrast.  No significant findings however there was some moderate stool in the colon.  Nodular pulmonary density noted and the radiologist recommended a chest CT a to exclude neoplasm.  CT angio of the chest June 2022 was also not normal.  I forwarded those results to her pulmonologist and they did not feel that she needed a PET CT scan at this point.  She already has a CT scan arranged by the pulmonologist at 50-month follow-up and she was recommended to continue to go ahead with that.   HPI: This is a very pleasant 65 year old woman whom I last saw about 2 months ago.  Her weight is up 2 pounds since her last office visit here about 2 months ago.  Same scale.  She tells me the pains in her lower abdomen have improved but are still present.  She is bothered by them about once per week and it can last for several hours.  She really thinks that her nerves are playing a role.  She spoke quite a while about her 78 year old grandson that lives with her who seems to impose his will on her house without much thought for her emotion or wishes.  When the pain hits she tells me that of Bentyl will usually help or having her hound dog lay on her abdomen will also help.  ROS: complete GI ROS as described in HPI, all other review negative.  Constitutional:  No unintentional weight loss   Past Medical History:  Diagnosis Date   Allergic rhinitis    Anemia     Anxiety    Chicken pox    Chronic back pain    COPD (chronic obstructive pulmonary disease) (HCC)    Depression    DM (diabetes mellitus) ([place])    Essential hypertension    GERD (gastroesophageal reflux disease)    Headache    migraines   History of gastritis    EGD 2015   History of home oxygen therapy    2 liters at hs last 6 months   Hyperlipidemia    Hypothyroidism    Migraines    Osteoarthritis    oa   Scoliosis     Past Surgical History:  Procedure Laterality Date   APPENDECTOMY     1985   BIOPSY  07/24/2017   Procedure: BIOPSY;  Surgeon: Milus Banister, MD;  Location: [facility];  Service: Endoscopy;;   CARDIAC CATHETERIZATION N/A 10/31/2015   Procedure: Left Heart Cath and Coronary Angiography;  Surgeon: Leonie Man, MD;  Location: [facility];  Service: Cardiovascular;  Laterality: N/A;   CARPAL TUNNEL RELEASE Left    CARPAL TUNNEL RELEASE Right    CHOLECYSTECTOMY  late 1980's   COLONOSCOPY WITH PROPOFOL N/A 07/24/2017   Procedure: COLONOSCOPY WITH PROPOFOL;  Surgeon: Milus Banister, MD;  [facility]: [facility];  Service: Endoscopy;  Laterality: N/A;   ESOPHAGOGASTRODUODENOSCOPY N/A 07/24/2017   Procedure: ESOPHAGOGASTRODUODENOSCOPY (EGD);  Surgeon: Milus Banister, MD;  [facility];  Service: Endoscopy;  Laterality: N/A;   GALLBLADDER SURGERY  1991   HIP CLOSED REDUCTION Right 01/08/2016   Procedure: CLOSED MANIPULATION HIP;  Surgeon: Susa Day, MD;  [facility]: [facility];  Service: Orthopedics;  Laterality: Right;   HIP CLOSED REDUCTION Right 01/19/2016   Procedure: ATTEMPTED CLOSED REDUCTION RIGHT HIP;  Surgeon: Wylene Simmer, MD;  [facility]: [facility];  Service: Orthopedics;  Laterality: Right;   HIP CLOSED REDUCTION Right 01/20/2016   Procedure: CLOSED REDUCTION RIGHT TOTAL HIP;  Surgeon: Paralee Cancel, MD;  [facility];  Service: Orthopedics;  Laterality: Right;   HIP CLOSED REDUCTION Right 02/17/2016   Procedure: CLOSED REDUCTION RIGHT  TOTAL HIP;  Surgeon: Rod Can, MD;  Location: [facility];  Service: Orthopedics;  Laterality: Right;   HIP CLOSED REDUCTION Right 02/28/2016   Procedure: CLOSED REDUCTION HIP;  Surgeon: Nicholes Stairs, MD;  [facility];  Service: Orthopedics;  Laterality: Right;   POLYPECTOMY  07/24/2017   Procedure: POLYPECTOMY;  Surgeon: Milus Banister, MD;  [facility];  Service: Endoscopy;;   [place], with 1 ovary removed and 2 nd ovary removed 2003   TOTAL HIP ARTHROPLASTY Right    Original surgery 2006 with revision 2010   TOTAL HIP REVISION Right 01/01/2016   Procedure: TOTAL HIP REVISION;  Surgeon: Paralee Cancel, MD;  [facility];  Service: Orthopedics;  Laterality: Right;   TOTAL HIP REVISION Right 03/02/2016   Procedure: TOTAL HIP REVISION;  Surgeon: Paralee Cancel, MD;  [facility];  Service: Orthopedics;  Laterality: Right;   TOTAL HIP REVISION Right 09/02/2016   Procedure: Right hip constrained liner- posterior;  Surgeon: Paralee Cancel, MD;  [facility];  Service: Orthopedics;  Laterality: Right;   ULNAR NERVE TRANSPOSITION Right     Current Outpatient Medications  Medication Sig Dispense Refill   Accu-Chek Softclix Lancets lancets 4 (four) times daily.     albuterol (PROAIR HFA) 108 (90 Base) MCG/ACT inhaler 2 puffs every 4 hours as needed only  if your can't catch your breath 18 g 3   ALPRAZolam (XANAX) 1 MG tablet Take 1 tablet po BID as needed for anxiety. 60 tablet 0   Aspirin-Acetaminophen-Caffeine (GOODY HEADACHE PO) Take 1 tablet by mouth as needed. As needed     atorvastatin (LIPITOR) 20 MG tablet Take 1 tablet (20 mg total) by mouth at bedtime. 90 tablet 1   cyclobenzaprine (FLEXERIL) 10 MG tablet Take 1 tablet (10 mg total) by mouth 3 (three) times daily as needed for muscle spasms. 90 tablet 5   dicyclomine (BENTYL) 10 MG capsule Take 1 capsule (10 mg total) by mouth 2 (two) times daily as needed for  spasms. 30 capsule 0   DULoxetine (CYMBALTA) 60 MG capsule Take 2 capsules (120 mg total) by mouth daily. 60 capsule 5   fluticasone (FLONASE) 50 MCG/ACT nasal spray Place 2 sprays into both nostrils daily. 16 g 6   Fluticasone-Umeclidin-Vilant (TRELEGY ELLIPTA) 100-62.5-25 MCG/INH AEPB Inhale 1 puff into the lungs daily. 60 each 6   furosemide (LASIX) 20 MG tablet TAKE 1 TABLET BY MOUTH EVERY DAY AS NEEDED 30 tablet 90   glucose blood test strip Use as instructed 300 each 11   HYDROcodone-acetaminophen (NORCO) 10-325 MG tablet Take 1 tablet by mouth every 8 (eight) hours as needed. 90 tablet 0   levothyroxine (SYNTHROID) 150 MCG tablet Take 1 tablet (150 mcg total) by  mouth daily before breakfast. 30 tablet 5   loperamide (IMODIUM A-D) 2 MG tablet Take 1 tablet (2 mg total) by mouth in the morning and at bedtime. 60 tablet 3   loratadine (CLARITIN) 10 MG tablet Take 10 mg by mouth daily.     omeprazole (PRILOSEC) 40 MG capsule TAKE ONE CAPSULE BY MOUTH TWICE DAILY 60 capsule 2   ondansetron (ZOFRAN) 4 MG tablet Take 1 tablet (4 mg total) by mouth as needed for nausea. 50 tablet 6   potassium chloride (KLOR-CON) 10 MEQ tablet Take 1 tablet (10 mEq total) by mouth at bedtime. 90 tablet 1   triamterene-hydrochlorothiazide (MAXZIDE-25) 37.5-25 MG tablet Take 1 tablet by mouth daily. 90 tablet 1   No current facility-administered medications for this visit.    Allergies as of 09/05/2020 - Review Complete 09/05/2020  Allergen Reaction Noted   Metformin and related Diarrhea 05/03/2020   Nsaids Diarrhea 06/10/2017   Aleve [naproxen sodium] Other (See Comments) 10/26/2015   Codeine Nausea Only and Other (See Comments)    Penicillins Nausea Only and Other (See Comments)    Sulfonamide derivatives Hives     Family History  Problem Relation Age of Onset   COPD Mother    Heart disease Mother    Lung disease Father        Asbestosis   Heart attack Father    Heart disease Father    Cerebral  aneurysm Brother    Aneurysm Brother        Brain   Drug abuse Daughter    Epilepsy Son    Alcohol abuse Son    Drug abuse Son    Arthritis Maternal Grandmother    Heart disease Maternal Grandmother    Asthma Maternal Grandfather    Cancer Maternal Grandfather    Arthritis Paternal Grandmother    Heart disease Paternal Grandmother    Stroke Paternal Grandmother    Early death Paternal Grandfather    Heart disease Paternal Grandfather     Social History   Socioeconomic History   Marital status: Married    Spouse name: Not on file   Number of children: 2   Years of education: Not on file   Highest education level: Not on file  Occupational History   Occupation: disabled  Tobacco Use   Smoking status: Every Day    Packs/day: 1.50    Years: 46.00    Pack years: 69.00    Types: Cigarettes   Smokeless tobacco: Never   Tobacco comments:    tobacco info given  Vaping Use   Vaping Use: Never used  Substance and Sexual Activity   Alcohol use: No   Drug use: No   Sexual activity: Not Currently    Partners: Male  Other Topics Concern   Not on file  Social History Narrative   Right handed    Caffeine~ 2 cups per day    Lives at home with husband (strained relationship)   Primary caretaker for disabled brother who had aneurism   Daughter died Jun 20, 2018    Social Determinants of Radio broadcast assistant Strain: Low Risk    Difficulty of Paying Living Expenses: Not hard at all  Food Insecurity: No Food Insecurity   Worried About Charity fundraiser in the Last Year: Never true   Arboriculturist in the Last Year: Never true  Transportation Needs: No Transportation Needs   Lack of Transportation (Medical): No   Lack of Transportation (Non-Medical):  No  Physical Activity: Inactive   Days of Exercise per Week: 0 days   Minutes of Exercise per Session: 0 min  Stress: Stress Concern Present   Feeling of Stress : To some extent  Social Connections: Moderately Integrated    Frequency of Communication with Friends and Family: More than three times a week   Frequency of Social Gatherings with Friends and Family: Once a week   Attends Religious Services: 1 to 4 times per year   Active Member of [employer] or Organizations: No   Attends Music therapist: Never   Marital Status: Married  Human resources officer Violence: Not At Risk   Fear of Current or Ex-Partner: No   Emotionally Abused: No   Physically Abused: No   Sexually Abused: No     Physical Exam: BP 130/70 (BP Location: Left Arm, Patient Position: Sitting, Cuff Size: Normal)   Pulse 80   Ht 5' 2.5" (1.588 m)   Wt 213 lb (96.6 kg)   BMI 38.34 kg/m  Constitutional: generally well-appearing Psychiatric: alert and oriented x3 Abdomen: soft, nontender, nondistended, no obvious ascites, no peritoneal signs, normal bowel sounds No peripheral edema noted in lower extremities  Assessment and plan: 65 y.o. female with lower abdominal pain  , I think her lower abdominal pains are IBS, spasm related.  Bentyl seems to generally help.  Also the pressure and heat from her hound dog laying on her abdomen also seems to help.  She knows she can continue either or both of these remedies.  I do not think any further work-up is needed since she has had blood work, colonoscopy fairly recently, CT scans 2 months ago.  Please see the "Patient Instructions" section for addition details about the plan.  Owens Loffler, MD [facility] 09/05/2020, 3:02 PM   Total time on date of encounter was 30 minutes (this included time spent preparing to see the patient reviewing records; obtaining and/or reviewing separately obtained history; performing a medically appropriate exam and/or evaluation; counseling and educating the patient and family if present; ordering medications, tests or procedures if applicable; and documenting clinical information in the health record).

## 2020-09-05 NOTE — Telephone Encounter (Signed)
Pt is returning phone call in regards to results. Pls regard; [phone].

## 2020-09-05 NOTE — Patient Instructions (Signed)
If you are age 64 or younger, your body mass index should be between 19-25. Your Body mass index is 38.34 kg/m. If this is out of the aformentioned range listed, please consider follow up with your Primary Care Provider.   __________________________________________________________  The [facility] providers would like to encourage you to use [facility] to communicate with providers for non-urgent requests or questions.  Due to long hold times on the telephone, sending your provider a message by [facility] may be a faster and more efficient way to get a response.  Please allow 48 business hours for a response.  Please remember that this is for non-urgent requests.   You will follow up with our office on an as need basis.  Thank you for entrusting me with your care and choosing [facility].  Dr Ardis Hughs

## 2020-09-05 NOTE — Telephone Encounter (Signed)
Call made to patient, confirmed DOB. Made aware ONO results are in [place] box and she returns this Thursday and we will f/u with her once JE has reviewed. Voiced understanding.   JE, results in box. Please advise on ONO results. Thanks :) Pt aware there will be a delayed response.

## 2020-09-07 ENCOUNTER — Telehealth: Payer: Self-pay | Admitting: Pulmonary Disease

## 2020-09-08 ENCOUNTER — Telehealth: Payer: Self-pay | Admitting: *Deleted

## 2020-09-08 ENCOUNTER — Telehealth: Payer: Self-pay

## 2020-09-08 NOTE — Telephone Encounter (Signed)
Attempted to contact patient to schedule an appointment with Dr.O'Neal on 08/17 at 1:20 PM.  Patient did not answer, LVM to call back to get scheduled.  Left call back number.

## 2020-09-08 NOTE — Telephone Encounter (Signed)
Called [facility] to request a copy of ONO to be faxed to [phone].  I was advised that there is an issue with [facility] being able to fax out, once the issue is resolved, they will have it faxed to [facility] fax # listed above. Dr. Loanne Drilling made aware.

## 2020-09-08 NOTE — Telephone Encounter (Signed)
Dr. Loanne Drilling would like to have patient's CT scan for December 2022 moved up to a sooner time.  She believes someone called her to schedule it, but cannot find her not.  Can someone reach out to her to schedule it?  Thank you.

## 2020-09-08 NOTE — Telephone Encounter (Signed)
Informed this AM ONO results paperwork has not been able to be located. Call placed to [place]. They will re-send the results.

## 2020-09-08 NOTE — Telephone Encounter (Signed)
[facility] Telephone Encounter  I contacted patient via telephone to discuss recent test results.   Reviewed overnight oximetry completed on 08/20/20. Time <88% 110.2 min with consecutive time <88% 12 minutes. [place] SpO2 82%. Patient qualifies for oxygen.  She also wished to discuss establishing care/consulting a cardiologist for intermittent non-radiating chest pain she is experiencing sometimes with exertion. No associated symptoms. Her prior cardiac work-up in 2017 demonstrated a negative LHC however she expresses concern about many family members developing severe heart failure.  Assessment  Nocturnal hypoxemia Intermittent chest pain  --START oxygen 2L via [place] nightly --Refer to Cardiology with Dr. Storm Frisk, M.D. [facility] 09/08/2020 1:18 PM

## 2020-09-10 ENCOUNTER — Encounter: Payer: Self-pay | Admitting: Physician Assistant

## 2020-09-11 ENCOUNTER — Other Ambulatory Visit: Payer: Self-pay | Admitting: Physician Assistant

## 2020-09-11 ENCOUNTER — Telehealth: Payer: Self-pay | Admitting: Cardiovascular Disease

## 2020-09-11 DIAGNOSIS — J449 Chronic obstructive pulmonary disease, unspecified: Secondary | ICD-10-CM | POA: Diagnosis not present

## 2020-09-11 MED ORDER — ALPRAZOLAM 1 MG PO TABS: ORAL_TABLET | ORAL | 2 refills | Status: DC

## 2020-09-11 NOTE — Telephone Encounter (Signed)
Sched for 8/17 at 2:30pm at [facility]. Informed pt. Nothing further needed.

## 2020-09-11 NOTE — Telephone Encounter (Signed)
Nurse picked up

## 2020-09-12 ENCOUNTER — Other Ambulatory Visit: Payer: Medicare HMO

## 2020-09-12 NOTE — Telephone Encounter (Signed)
Patient returned call, spoke to scheduler- who spoke with me and scheduled patient on 08/17 at 1:20.  Patient aware.

## 2020-09-12 NOTE — Progress Notes (Signed)
Cardiology Office Note:   Date:  09/13/2020  NAME:  [name]    MRN: [id number] DOB:  Jun 15, 1955   PCP:  Fredirick Lathe, PA-C  Cardiologist:  None  Electrophysiologist:  None   Referring MD: Margaretha Seeds, MD   Chief Complaint  Patient presents with   Chest Pain    History of Present Illness:   [name] is a 65 y.o. female with a hx of COPD, diabetes, tobacco abuse, hypertension who is being seen today for the evaluation of chest pain at the request of Margaretha Seeds, MD. Recent chest ct with coronary calcification in the LAD distribution.  She reports for the last 4 months has had episodes of intermittent chest pain.  Described as pressure in her chest.  This occurs 3 times per day.  It occurs with stressful situations.  She reports her daughter died in 06-16-18.  She found her dad in the home.  Apparently she was abusing drugs.  She reports that her oldest grandson lives with her.  Apparently his lifestyle does bother her and this creates a lot of stress.  Her symptoms are less than when he goes away for the weekends.  She reports she is not active and does not exercise.  Exertion does not appear to worsen her chest pain.  Pain does not alleviated by rest.  She has not tried nitroglycerin.  She had a left heart catheterization in 2017 that showed angiographically normal coronary arteries.  She does have extensive coronary calcifications on CT scan.  She reports she does get short of breath with minimal activity.  She is a 1.5 pack/day smoker.  She has smoked for 45 to 50 years.  No interest in quitting.  She does have diabetes with an A1c of 6.7.  Most recent LDL 27.  Triglycerides elevated.  Her blood pressure in office is minimally elevated 140/88.  EKG in office demonstrates normal sinus rhythm with no acute ischemic changes or evidence of infarction.  She also describes leg pain.  She reports that her legs cramp all the time when she walks and even with sitting.  She  has very poor pulses in the right lower extremity.  She has 2+ pulses in the left lower extremity.  She has never had a heart attack or stroke.  Family history is significant for heart disease in her mother.  She is currently disabled.  She is married but has a poor relationship with her husband.  She also reports a significant stress in her life.  Cardiovascular examination is normal.  Problem List COPD Tobacco abuse DM -A1c 6.7 HTN HLD -T chol 98, HDL 32, LDL 27, TG 193 Anxiety/Depression  Past Medical History: Past Medical History:  Diagnosis Date   Allergic rhinitis    Anemia    Anxiety    Chicken pox    Chronic back pain    COPD (chronic obstructive pulmonary disease) (HCC)    Depression    DM (diabetes mellitus) (HCC)    Essential hypertension    GERD (gastroesophageal reflux disease)    Headache    migraines   History of gastritis    EGD 2015   History of home oxygen therapy    2 liters at hs last 6 months   Hyperlipidemia    Hypothyroidism    Migraines    Osteoarthritis    oa   Scoliosis     Past Surgical History: Past Surgical History:  Procedure Laterality Date  APPENDECTOMY     1985   BIOPSY  07/24/2017   Procedure: BIOPSY;  Surgeon: Milus Banister, MD;  Location: [facility];  Service: Endoscopy;;   CARDIAC CATHETERIZATION N/A 10/31/2015   Procedure: Left Heart Cath and Coronary Angiography;  Surgeon: Leonie Man, MD;  Location: [facility];  Service: Cardiovascular;  Laterality: N/A;   CARPAL TUNNEL RELEASE Left    CARPAL TUNNEL RELEASE Right    CHOLECYSTECTOMY  late 1980's   COLONOSCOPY WITH PROPOFOL N/A 07/24/2017   Procedure: COLONOSCOPY WITH PROPOFOL;  Surgeon: Milus Banister, MD;  Location: [facility];  Service: Endoscopy;  Laterality: N/A;   ESOPHAGOGASTRODUODENOSCOPY N/A 07/24/2017   Procedure: ESOPHAGOGASTRODUODENOSCOPY (EGD);  Surgeon: Milus Banister, MD;  Location: [facility];  Service: Endoscopy;  Laterality: N/A;    GALLBLADDER SURGERY  1991   HIP CLOSED REDUCTION Right 01/08/2016   Procedure: CLOSED MANIPULATION HIP;  Surgeon: Susa Day, MD;  [facility]: [facility];  Service: Orthopedics;  Laterality: Right;   HIP CLOSED REDUCTION Right 01/19/2016   Procedure: ATTEMPTED CLOSED REDUCTION RIGHT HIP;  Surgeon: Wylene Simmer, MD;  [facility]: [facility];  Service: Orthopedics;  Laterality: Right;   HIP CLOSED REDUCTION Right 01/20/2016   Procedure: CLOSED REDUCTION RIGHT TOTAL HIP;  Surgeon: Paralee Cancel, MD;  Location: [facility];  Service: Orthopedics;  Laterality: Right;   HIP CLOSED REDUCTION Right 02/17/2016   Procedure: CLOSED REDUCTION RIGHT TOTAL HIP;  Surgeon: Rod Can, MD;  Location: [place];  Service: Orthopedics;  Laterality: Right;   HIP CLOSED REDUCTION Right 02/28/2016   Procedure: CLOSED REDUCTION HIP;  Surgeon: Nicholes Stairs, MD;  [facility];  Service: Orthopedics;  Laterality: Right;   POLYPECTOMY  07/24/2017   Procedure: POLYPECTOMY;  Surgeon: Milus Banister, MD;  [facility];  Service: Endoscopy;;   [place], with 1 ovary removed and 2 nd ovary removed 2003   TOTAL HIP ARTHROPLASTY Right    Original surgery 2006 with revision 2010   TOTAL HIP REVISION Right 01/01/2016   Procedure: TOTAL HIP REVISION;  Surgeon: Paralee Cancel, MD;  [facility]: [facility];  Service: Orthopedics;  Laterality: Right;   TOTAL HIP REVISION Right 03/02/2016   Procedure: TOTAL HIP REVISION;  Surgeon: Paralee Cancel, MD;  Location: [facility];  Service: Orthopedics;  Laterality: Right;   TOTAL HIP REVISION Right 09/02/2016   Procedure: Right hip constrained liner- posterior;  Surgeon: Paralee Cancel, MD;  [facility];  Service: Orthopedics;  Laterality: Right;   ULNAR NERVE TRANSPOSITION Right     Current Medications: Current Meds  Medication Sig   Accu-Chek Softclix Lancets lancets 4 (four) times daily.   albuterol (PROAIR HFA) 108 (90 Base) MCG/ACT inhaler 2  puffs every 4 hours as needed only  if your can't catch your breath   ALPRAZolam (XANAX) 1 MG tablet Take 1 tablet po BID as needed for anxiety.   aspirin EC 81 MG tablet Take 1 tablet (81 mg total) by mouth daily. Swallow whole.   Aspirin-Acetaminophen-Caffeine (GOODY HEADACHE PO) Take 1 tablet by mouth as needed. As needed   atorvastatin (LIPITOR) 20 MG tablet Take 1 tablet (20 mg total) by mouth at bedtime.   cyclobenzaprine (FLEXERIL) 10 MG tablet Take 1 tablet (10 mg total) by mouth 3 (three) times daily as needed for muscle spasms.   dicyclomine (BENTYL) 10 MG capsule Take 1 capsule (10 mg total) by mouth 2 (two) times daily  as needed for spasms.   DULoxetine (CYMBALTA) 60 MG capsule Take 2 capsules (120 mg total) by mouth daily.   fluticasone (FLONASE) 50 MCG/ACT nasal spray Place 2 sprays into both nostrils daily.   Fluticasone-Umeclidin-Vilant (TRELEGY ELLIPTA) 100-62.5-25 MCG/INH AEPB Inhale 1 puff into the lungs daily.   furosemide (LASIX) 20 MG tablet TAKE 1 TABLET BY MOUTH EVERY DAY AS NEEDED   glucose blood test strip Use as instructed   HYDROcodone-acetaminophen (NORCO) 10-325 MG tablet Take 1 tablet by mouth every 8 (eight) hours as needed.   levothyroxine (SYNTHROID) 150 MCG tablet Take 1 tablet (150 mcg total) by mouth daily before breakfast.   loratadine (CLARITIN) 10 MG tablet Take 10 mg by mouth daily.   metoprolol tartrate (LOPRESSOR) 100 MG tablet Take 1 tablet by mouth once for procedure.   omeprazole (PRILOSEC) 40 MG capsule TAKE ONE CAPSULE BY MOUTH TWICE DAILY   ondansetron (ZOFRAN) 4 MG tablet Take 1 tablet (4 mg total) by mouth as needed for nausea.   OXYGEN Inhale into the lungs. Uses At Night   potassium chloride (KLOR-CON) 10 MEQ tablet Take 1 tablet (10 mEq total) by mouth at bedtime.   triamterene-hydrochlorothiazide (MAXZIDE-25) 37.5-25 MG tablet Take 1 tablet by mouth daily.     Allergies:    Metformin and related, Nsaids, Aleve [naproxen sodium], Codeine,  Penicillins, and Sulfonamide derivatives   Social History: Social History   Socioeconomic History   Marital status: Married    Spouse name: Not on file   Number of children: 2   Years of education: Not on file   Highest education level: Not on file  Occupational History   Occupation: disabled   Occupation: disabled  Tobacco Use   Smoking status: Every Day    Packs/day: 1.50    Years: 46.00    Pack years: 69.00    Types: Cigarettes   Smokeless tobacco: Never   Tobacco comments:    tobacco info given  Vaping Use   Vaping Use: Never used  Substance and Sexual Activity   Alcohol use: No   Drug use: No   Sexual activity: Not Currently    Partners: Male  Other Topics Concern   Not on file  Social History Narrative   Right handed    Caffeine~ 2 cups per day    Lives at home with husband (strained relationship)   Primary caretaker for disabled brother who had aneurism   Daughter died 06-22-2018    Social Determinants of Radio broadcast assistant Strain: Low Risk    Difficulty of Paying Living Expenses: Not hard at all  Food Insecurity: No Food Insecurity   Worried About Charity fundraiser in the Last Year: Never true   Arboriculturist in the Last Year: Never true  Transportation Needs: No Transportation Needs   Lack of Transportation (Medical): No   Lack of Transportation (Non-Medical): No  Physical Activity: Inactive   Days of Exercise per Week: 0 days   Minutes of Exercise per Session: 0 min  Stress: Stress Concern Present   Feeling of Stress : To some extent  Social Connections: Moderately Integrated   Frequency of Communication with Friends and Family: More than three times a week   Frequency of Social Gatherings with Friends and Family: Once a week   Attends Religious Services: 1 to 4 times per year   Active Member of [employer] or Organizations: No   Attends Archivist Meetings: Never   Marital  Status: Married     Family History: The patient's  family history includes Alcohol abuse in her son; Aneurysm in her brother; Arthritis in her maternal grandmother and paternal grandmother; Asthma in her maternal grandfather; COPD in her mother; Cancer in her maternal grandfather; Cerebral aneurysm in her brother; Drug abuse in her daughter and son; Early death in her paternal grandfather; Epilepsy in her son; Heart attack in her father; Heart disease in her father, maternal grandmother, mother, paternal grandfather, and paternal grandmother; Lung disease in her father; Stroke in her paternal grandmother.  ROS:   All other ROS reviewed and negative. Pertinent positives noted in the HPI.     EKGs/Labs/Other Studies Reviewed:   The following studies were personally reviewed by me today:  EKG:  EKG is ordered today.  The ekg ordered today demonstrates normal sinus rhythm heart rate 77, no acute ischemic changes or evidence of infarction, and was personally reviewed by me.   Recent Labs: 05/25/2020: ALT 19; Hemoglobin 13.8; Platelets 203.0 06/28/2020: BUN 12; Creatinine, Ser 0.98; Potassium 4.0; Sodium 138 09/04/2020: TSH 0.43   Recent Lipid Panel    Component Value Date/Time   CHOL 98 11/30/2019 1609   TRIG 193.0 (H) 11/30/2019 1609   HDL 31.90 (L) 11/30/2019 1609   CHOLHDL 3 11/30/2019 1609   VLDL 38.6 11/30/2019 1609   LDLCALC 27 11/30/2019 1609   LDLDIRECT 72.0 08/31/2018 1411    Physical Exam:   VS:  BP 140/88 (BP Location: Right Arm)   Pulse 77   Ht 5' 2.5" (1.588 m)   Wt 215 lb 6.4 oz (97.7 kg)   SpO2 96%   BMI 38.77 kg/m    Wt Readings from Last 3 Encounters:  09/13/20 215 lb 6.4 oz (97.7 kg)  09/05/20 213 lb (96.6 kg)  09/04/20 212 lb 3.2 oz (96.3 kg)    General: Well nourished, well developed, in no acute distress Head: Atraumatic, normal size  Eyes: PEERLA, EOMI  Neck: Supple, no JVD Endocrine: No thryomegaly Cardiac: Normal S1, S2; RRR; no murmurs, rubs, or gallops Lungs: Clear to auscultation bilaterally, no  wheezing, rhonchi or rales  Abd: Soft, nontender, no hepatomegaly  Ext: No edema, 2+ DP pulse in left lower extremity, absent DP and PT pulse in the right lower extremity Musculoskeletal: No deformities, BUE and BLE strength normal and equal Skin: Warm and dry, no rashes   Neuro: Alert and oriented to person, place, time, and situation, CNII-XII grossly intact, no focal deficits  Psych: Normal mood and affect   ASSESSMENT:   [name] is a 65 y.o. female who presents for the following: 1. Chest pain, unspecified type   2. Mixed hyperlipidemia   3. Coronary artery calcification seen on CAT scan   4. Tobacco abuse   5. Pain in both lower extremities     PLAN:   1. Chest pain, unspecified type -Possibly cardiac chest pain.  She reports pressure in her chest with stressful situations.  CVD risk factors include smoking, hyperlipidemia, diabetes and family history of heart disease.  The chest pain is not exertional and alleviated by rest.  She does have coronary calcification seen on CT scan in the LAD distribution.  She had a left heart cath in 27 that showed angiographically normal coronary arteries.  This was 5 years ago.  I recommended repeat a coronary CTA.  We will give her 100 mg of metoprolol tartrate 2 hours before the scan.  She has no active wheezing that she be  okay.  There is also mention of an enlarged heart on her CT of her chest for lung cancer screening.  We will get a better idea of this on echocardiogram.  She will continue her current lipid-lowering agents.  We will add an aspirin regimen to her therapy as well.  2. Mixed hyperlipidemia -Lipitor 20.  LDL is at goal.  Triglycerides not at goal.  Will need repeat at some point.  3. Coronary artery calcification seen on CAT scan -Coronary calcifications in LAD noted.  Coronary CTA as above.  4. Tobacco abuse -5 minutes of smoking cessation counseling was provided.  5. Pain in both lower extremities -Absent pulses in  the right lower extremities, poor pulses in the left lower extremity.  She reports cramping in her legs.  We will obtain vascular ultrasounds with ABIs.  Disposition: Return in about 4 months (around 01/13/2021).  Medication Adjustments/Labs and Tests Ordered: Current medicines are reviewed at length with the patient today.  Concerns regarding medicines are outlined above.  Orders Placed This Encounter  Procedures   CT CORONARY MORPH W/CTA COR W/SCORE W/CA W/CM &/OR WO/CM   Basic metabolic panel   Brain natriuretic peptide   EKG 12-Lead   ECHOCARDIOGRAM COMPLETE   VAS [place] LOWER EXTREMITY ARTERIAL DUPLEX   VAS [place] ABI WITH/WO TBI    Meds ordered this encounter  Medications   metoprolol tartrate (LOPRESSOR) 100 MG tablet    Sig: Take 1 tablet by mouth once for procedure.    Dispense:  1 tablet    Refill:  0   aspirin EC 81 MG tablet    Sig: Take 1 tablet (81 mg total) by mouth daily. Swallow whole.    Dispense:  90 tablet    Refill:  3     Patient Instructions  Medication Instructions:  Take Metoprolol Tartrate 100 mg two hours before the scan when scheduled.  Start Aspirin 81 mg daily   *If you need a refill on your cardiac medications before your next appointment, please call your pharmacy*   Lab Work: BNP, BMET today   If you have labs (blood work) drawn today and your tests are completely normal, you will receive your results only by: [facility] (if you have MyChart) OR A paper copy in the mail If you have any lab test that is abnormal or we need to change your treatment, we will call you to review the results.   Testing/Procedures: Your physician has requested that you have cardiac CT. Cardiac computed tomography (CT) is a painless test that uses an x-ray machine to take clear, detailed pictures of your heart. For further information please visit [email or web address].tn. Please follow instruction sheet as given.  Echocardiogram - Your physician has requested that  you have an echocardiogram. Echocardiography is a painless test that uses sound waves to create images of your heart. It provides your doctor with information about the size and shape of your heart and how well your heart's chambers and valves are working. This procedure takes approximately one hour. There are no restrictions for this procedure. This will be performed at our [facility] location - [address].  Your physician has requested that you have an ankle brachial index (ABI). During this test an ultrasound and blood pressure cuff are used to evaluate the arteries that supply the arms and legs with blood. Allow thirty minutes for this exam. There are no restrictions or special instructions.  Your physician has requested that you have  a lower extremity arterial duplex. This test is an ultrasound of the arteries in the legs. It looks at arterial blood flow in the legs. Allow one hour for Lower and Arterial scans. There are no restrictions or special instructions   Follow-Up: At [facility], you and your health needs are our priority.  As part of our continuing mission to provide you with exceptional heart care, we have created designated Provider Care Teams.  These Care Teams include your primary Cardiologist (physician) and Advanced Practice Providers (APPs -  Physician Assistants and Nurse Practitioners) who all work together to provide you with the care you need, when you need it.  We recommend signing up for the patient portal called "MyChart".  Sign up information is provided on this After Visit Summary.  MyChart is used to connect with patients for Virtual Visits (Telemedicine).  Patients are able to view lab/test results, encounter notes, upcoming appointments, etc.  Non-urgent messages can be sent to your provider as well.   To learn more about what you can do with MyChart, go to [email or web address].    Your next appointment:   4 month(s)  The format for your next  appointment:   In Person  Provider:   Eleonore Chiquito, MD   Other Instructions   Your cardiac CT will be scheduled at one of the below locations:   [facility] [address] [place], [place] [address] [phone]  If scheduled at [facility], please arrive at the [facility] main entrance (entrance A) of [facility] 30 minutes prior to test start time. Proceed to the [facility] Radiology Department (first floor) to check-in and test prep.  Please follow these instructions carefully (unless otherwise directed):   On the Night Before the Test: Be sure to Drink plenty of water. Do not consume any caffeinated/decaffeinated beverages or chocolate 12 hours prior to your test. Do not take any antihistamines 12 hours prior to your test.  On the Day of the Test: Drink plenty of water until 1 hour prior to the test. Do not eat any food 4 hours prior to the test. You may take your regular medications prior to the test.  Take metoprolol (Lopressor) two hours prior to test. HOLD Furosemide/Hydrochlorothiazide morning of the test. FEMALES- please wear underwire-free bra if available, avoid dresses & tight clothing      After the Test: Drink plenty of water. After receiving IV contrast, you may experience a mild flushed feeling. This is normal. On occasion, you may experience a mild rash up to 24 hours after the test. This is not dangerous. If this occurs, you can take Benadryl 25 mg and increase your fluid intake. If you experience trouble breathing, this can be serious. If it is severe call 911 IMMEDIATELY. If it is mild, please call our office. If you take any of these medications: Glipizide/Metformin, Avandament, Glucavance, please do not take 48 hours after completing test unless otherwise instructed.  Please allow 2-4 weeks for scheduling of routine cardiac CTs. Some insurance companies require a pre-authorization which may delay scheduling of this  test.   For non-scheduling related questions, please contact the cardiac imaging nurse navigator should you have any questions/concerns: Marchia Bond, Cardiac Imaging Nurse Navigator Gordy Clement, Cardiac Imaging Nurse Navigator [facility] Direct Office Dial: [phone]   For scheduling needs, including cancellations and rescheduling, please call [place], [phone].     Signed, Addison Naegeli. Audie Box, MD, [place]  [place], [place], [place] [address] [phone]  09/13/2020 4:02 PM

## 2020-09-12 NOTE — Telephone Encounter (Signed)
Error

## 2020-09-13 ENCOUNTER — Encounter: Payer: Self-pay | Admitting: Cardiovascular Disease

## 2020-09-13 ENCOUNTER — Other Ambulatory Visit: Payer: Self-pay

## 2020-09-13 ENCOUNTER — Ambulatory Visit: Payer: Medicare HMO | Admitting: Cardiovascular Disease

## 2020-09-13 VITALS — BP 140/88 | HR 77 | Ht 62.5 in | Wt 215.4 lb

## 2020-09-13 DIAGNOSIS — R079 Chest pain, unspecified: Secondary | ICD-10-CM | POA: Diagnosis not present

## 2020-09-13 DIAGNOSIS — F17210 Nicotine dependence, cigarettes, uncomplicated: Secondary | ICD-10-CM | POA: Diagnosis not present

## 2020-09-13 DIAGNOSIS — R69 Illness, unspecified: Secondary | ICD-10-CM | POA: Diagnosis not present

## 2020-09-13 DIAGNOSIS — I2510 Atherosclerotic heart disease of native coronary artery without angina pectoris: Secondary | ICD-10-CM

## 2020-09-13 DIAGNOSIS — E782 Mixed hyperlipidemia: Secondary | ICD-10-CM

## 2020-09-13 DIAGNOSIS — M79605 Pain in left leg: Secondary | ICD-10-CM

## 2020-09-13 DIAGNOSIS — Z720 Tobacco use: Secondary | ICD-10-CM

## 2020-09-13 DIAGNOSIS — M79604 Pain in right leg: Secondary | ICD-10-CM

## 2020-09-13 MED ORDER — METOPROLOL TARTRATE 100 MG PO TABS: ORAL_TABLET | ORAL | 0 refills | Status: DC

## 2020-09-13 MED ORDER — ASPIRIN EC 81 MG PO TBEC: 81.0000 mg | DELAYED_RELEASE_TABLET | Freq: Every day | ORAL | 3 refills | Status: DC

## 2020-09-13 NOTE — Patient Instructions (Addendum)
Medication Instructions:  Take Metoprolol Tartrate 100 mg two hours before the scan when scheduled.  Start Aspirin 81 mg daily   *If you need a refill on your cardiac medications before your next appointment, please call your pharmacy*   Lab Work: BNP, BMET today   If you have labs (blood work) drawn today and your tests are completely normal, you will receive your results only by: [place] (if you have MyChart) OR A paper copy in the mail If you have any lab test that is abnormal or we need to change your treatment, we will call you to review the results.   Testing/Procedures: Your physician has requested that you have cardiac CT. Cardiac computed tomography (CT) is a painless test that uses an x-ray machine to take clear, detailed pictures of your heart. For further information please visit [email or web address]. Please follow instruction sheet as given.  Echocardiogram - Your physician has requested that you have an echocardiogram. Echocardiography is a painless test that uses sound waves to create images of your heart. It provides your doctor with information about the size and shape of your heart and how well your heart's chambers and valves are working. This procedure takes approximately one hour. There are no restrictions for this procedure. This will be performed at our [facility] location - [address].  Your physician has requested that you have an ankle brachial index (ABI). During this test an ultrasound and blood pressure cuff are used to evaluate the arteries that supply the arms and legs with blood. Allow thirty minutes for this exam. There are no restrictions or special instructions.  Your physician has requested that you have a lower extremity arterial duplex. This test is an ultrasound of the arteries in the legs. It looks at arterial blood flow in the legs. Allow one hour for Lower and Arterial scans. There are no restrictions or special  instructions   Follow-Up: At [facility], you and your health needs are our priority.  As part of our continuing mission to provide you with exceptional heart care, we have created designated Provider Care Teams.  These Care Teams include your primary Cardiologist (physician) and Advanced Practice Providers (APPs -  Physician Assistants and Nurse Practitioners) who all work together to provide you with the care you need, when you need it.  We recommend signing up for the patient portal called "MyChart".  Sign up information is provided on this After Visit Summary.  MyChart is used to connect with patients for Virtual Visits (Telemedicine).  Patients are able to view lab/test results, encounter notes, upcoming appointments, etc.  Non-urgent messages can be sent to your provider as well.   To learn more about what you can do with MyChart, go to [email or web address].    Your next appointment:   4 month(s)  The format for your next appointment:   In Person  Provider:   Eleonore Chiquito, MD   Other Instructions   Your cardiac CT will be scheduled at one of the below locations:   [facility] [address] [place], [place] [address] [phone]  If scheduled at [facility], please arrive at the [facility] main entrance (entrance A) of [facility] 30 minutes prior to test start time. Proceed to the [facility] Radiology Department (first floor) to check-in and test prep.  Please follow these instructions carefully (unless otherwise directed):   On the Night Before the Test: Be sure to Drink plenty of  water. Do not consume any caffeinated/decaffeinated beverages or chocolate 12 hours prior to your test. Do not take any antihistamines 12 hours prior to your test.  On the Day of the Test: Drink plenty of water until 1 hour prior to the test. Do not eat any food 4 hours prior to the test. You may take your regular medications prior to the test.  Take  metoprolol (Lopressor) two hours prior to test. HOLD Furosemide/Hydrochlorothiazide morning of the test. FEMALES- please wear underwire-free bra if available, avoid dresses & tight clothing      After the Test: Drink plenty of water. After receiving IV contrast, you may experience a mild flushed feeling. This is normal. On occasion, you may experience a mild rash up to 24 hours after the test. This is not dangerous. If this occurs, you can take Benadryl 25 mg and increase your fluid intake. If you experience trouble breathing, this can be serious. If it is severe call 911 IMMEDIATELY. If it is mild, please call our office. If you take any of these medications: Glipizide/Metformin, Avandament, Glucavance, please do not take 48 hours after completing test unless otherwise instructed.  Please allow 2-4 weeks for scheduling of routine cardiac CTs. Some insurance companies require a pre-authorization which may delay scheduling of this test.   For non-scheduling related questions, please contact the cardiac imaging nurse navigator should you have any questions/concerns: Marchia Bond, Cardiac Imaging Nurse Navigator Gordy Clement, Cardiac Imaging Nurse Navigator [facility] Direct Office Dial: [phone]   For scheduling needs, including cancellations and rescheduling, please call [place], [phone].

## 2020-09-14 LAB — BRAIN NATRIURETIC PEPTIDE: BNP: 23.8 pg/mL (ref 0.0–100.0)

## 2020-09-14 LAB — BASIC METABOLIC PANEL
BUN/Creatinine Ratio: 9 — ABNORMAL LOW (ref 12–28)
BUN: 8 mg/dL (ref 8–27)
CO2: 24 mmol/L (ref 20–29)
Calcium: 8.6 mg/dL — ABNORMAL LOW (ref 8.7–10.3)
Chloride: 93 mmol/L — ABNORMAL LOW (ref 96–106)
Creatinine, Ser: 0.91 mg/dL (ref 0.57–1.00)
Glucose: 65 mg/dL (ref 65–99)
Potassium: 4.9 mmol/L (ref 3.5–5.2)
Sodium: 134 mmol/L (ref 134–144)
eGFR: 70 mL/min/{1.73_m2} (ref >59)

## 2020-09-15 ENCOUNTER — Telehealth: Payer: Self-pay | Admitting: Pharmacist

## 2020-09-15 NOTE — Chronic Care Management (AMB) (Addendum)
Chronic Care Management Pharmacy Assistant   Name: [name]  MRN: [id number] DOB: October 25, 1955  Reason for Encounter: Diabetes Adherence Call    Recent office visits:  09/04/2020 OV Allwardt, Randa Evens, PA-C; chronic follow up, no medication changes indicated.  Recent consult visits:  09/13/2020 OV (cardiology) Geralynn Rile, MD; Coronary CTA recommended, we will give her 100 mg of metoprolol tartrate 2 hours before the scan, no medication changes indicated.  07/28/2020 OV (physical medicine and rehabilitation) Lovorn, Jinny Blossom, MD; will try increasing Cymbalta to 120 mg daily- morning or night- but 120 mg-changed to 60 mg pills. Change Flexeril to taking every other day.  07/25/2020 OV (pulmonology) Margaretha Seeds, MD; start prednisone 40 mg daily x 5 days for COPD exacerbation  Hospital visits:  None in previous 6 months  Medications: Outpatient Encounter Medications as of 09/15/2020  Medication Sig   Accu-Chek Softclix Lancets lancets 4 (four) times daily.   albuterol (PROAIR HFA) 108 (90 Base) MCG/ACT inhaler 2 puffs every 4 hours as needed only  if your can't catch your breath   ALPRAZolam (XANAX) 1 MG tablet Take 1 tablet po BID as needed for anxiety.   aspirin EC 81 MG tablet Take 1 tablet (81 mg total) by mouth daily. Swallow whole.   Aspirin-Acetaminophen-Caffeine (GOODY HEADACHE PO) Take 1 tablet by mouth as needed. As needed   atorvastatin (LIPITOR) 20 MG tablet Take 1 tablet (20 mg total) by mouth at bedtime.   cyclobenzaprine (FLEXERIL) 10 MG tablet Take 1 tablet (10 mg total) by mouth 3 (three) times daily as needed for muscle spasms.   dicyclomine (BENTYL) 10 MG capsule Take 1 capsule (10 mg total) by mouth 2 (two) times daily as needed for spasms.   DULoxetine (CYMBALTA) 60 MG capsule Take 2 capsules (120 mg total) by mouth daily.   fluticasone (FLONASE) 50 MCG/ACT nasal spray Place 2 sprays into both nostrils daily.   Fluticasone-Umeclidin-Vilant  (TRELEGY ELLIPTA) 100-62.5-25 MCG/INH AEPB Inhale 1 puff into the lungs daily.   furosemide (LASIX) 20 MG tablet TAKE 1 TABLET BY MOUTH EVERY DAY AS NEEDED   glucose blood test strip Use as instructed   HYDROcodone-acetaminophen (NORCO) 10-325 MG tablet Take 1 tablet by mouth every 8 (eight) hours as needed.   levothyroxine (SYNTHROID) 150 MCG tablet Take 1 tablet (150 mcg total) by mouth daily before breakfast.   loratadine (CLARITIN) 10 MG tablet Take 10 mg by mouth daily.   metoprolol tartrate (LOPRESSOR) 100 MG tablet Take 1 tablet by mouth once for procedure.   omeprazole (PRILOSEC) 40 MG capsule TAKE ONE CAPSULE BY MOUTH TWICE DAILY   ondansetron (ZOFRAN) 4 MG tablet Take 1 tablet (4 mg total) by mouth as needed for nausea.   OXYGEN Inhale into the lungs. Uses At Night   potassium chloride (KLOR-CON) 10 MEQ tablet Take 1 tablet (10 mEq total) by mouth at bedtime.   triamterene-hydrochlorothiazide (MAXZIDE-25) 37.5-25 MG tablet Take 1 tablet by mouth daily.   No facility-administered encounter medications on file as of 09/15/2020.   Recent Relevant Labs: Lab Results  Component Value Date/Time   HGBA1C 6.7 (H) 09/04/2020 03:35 PM   HGBA1C 6.5 03/01/2020 01:40 PM   MICROALBUR 0.4 03/12/2019 01:54 PM    Kidney Function Lab Results  Component Value Date/Time   CREATININE 0.91 09/13/2020 02:16 PM   CREATININE 0.98 06/28/2020 04:19 PM   CREATININE 1.19 (H) 03/12/2019 01:54 PM   CREATININE 1.0 06/30/2014 02:48 PM   CREATININE 1.1 12/29/2013  01:05 PM   GFR 61.04 06/28/2020 04:19 PM   GFRNONAA 59 (L) 07/07/2019 02:04 PM   GFRAA >60 07/07/2019 02:04 PM    Current antihyperglycemic regimen:  None  What recent interventions/DTPs have been made to improve glycemic control:  No recent interventions/DTPs.  Have there been any recent hospitalizations or ED visits since last visit with CPP? No  Patient denies hypoglycemic symptoms.  Patient denies hyperglycemic symptoms.  How often  are you checking your blood sugar? 3 times a week  What are your blood sugars ranging?  Fasting: between 70-90 Before meals: n/a After meals: n/a Bedtime: n/a  During the week, how often does your blood glucose drop below 70? Patient states she is not sure. She denies any hypoglycemia symptoms. She states she hasn't checked her blood glucose in a few weeks. Prior she was checking a few times a week.  Are you checking your feet daily/regularly? Patient states she checks her feet regularly. She states her feet feels like she has a pair of socks on all the time. She states they are red and swollen. Patient states her cardiologist told her she does not have a pulse in her right leg.  Adherence Review: Is the patient currently on a STATIN medication? Yes Is the patient currently on ACE/ARB medication? No Does the patient have >5 day gap between last estimated fill dates? No  Patient states she had a CT of her abdomen and found a nodule on her right lung. Per patient unable to have a biopsy due to location.  Patient states she started back using oxygen at night. She states she feels much better since starting back.  Patient scheduled a follow up appointment for 11/20/2020 at 3:00 pm.  Future Appointments  Date Time Provider [place]  09/20/2020  2:00 PM MC-CV NL VASC 1 MC-SECVI [facility]  09/21/2020  2:30 PM MC-CT 1 MC-CT [facility]  10/11/2020  1:45 PM LBCT-CT 1 LBCT-CT LB-CT [facility]  10/11/2020  2:05 PM MC-CV CH ECHO 1 [facility]3ECHO [facility]  10/27/2020  1:40 PM Lovorn, Jinny Blossom, MD CPR-PRMA CPR  11/20/2020  3:00 PM LBPC-HPC CCM PHARMACIST LBPC-HPC PEC  01/05/2021  3:00 PM Allwardt, Randa Evens, PA-C LBPC-HPC PEC  02/15/2021  2:00 PM O'Neal, Cassie Freer, MD [facility]     Star Rating Drugs: Atorvastatin 20 mg last filled 08/23/2020 90 DS  April D Calhoun, [place] Pharmacist Assistant [phone]   6 minutes spent in review, coordination, and documentation.  Patient needs  to have diabetic foot exam and eye exam this year.  Reviewed by: Beverly Milch, PharmD Clinical Pharmacist [phone]

## 2020-09-19 ENCOUNTER — Telehealth (HOSPITAL_COMMUNITY): Payer: Self-pay | Admitting: *Deleted

## 2020-09-19 ENCOUNTER — Telehealth: Payer: Self-pay | Admitting: Cardiovascular Disease

## 2020-09-19 NOTE — Telephone Encounter (Signed)
Pt is calling with questions about her appt on 09/20/20. Please advise pt further

## 2020-09-19 NOTE — Telephone Encounter (Signed)
Patient calling regarding upcoming cardiac imaging study; pt verbalizes understanding of appt date/time, parking situation and where to check in, pre-test NPO status and medications ordered, and verified current allergies; name and call back number provided for further questions should they arise  Gordy Clement RN Navigator Cardiac Imaging [facility] [phone] office [phone] cell.  Patient to take 100mg  metoprolol tartrate two hours prior to cardiac CT scan.  She is aware she can take her ativan and/or Norco as prescribed and states her husband will be taking her to her cardiac appointment.

## 2020-09-19 NOTE — Telephone Encounter (Signed)
Left message to call back  

## 2020-09-20 ENCOUNTER — Ambulatory Visit (HOSPITAL_COMMUNITY)
Admission: RE | Admit: 2020-09-20 | Discharge: 2020-09-20 | Disposition: A | Discharge status: Home / Self Care | Payer: Medicare HMO | Source: Ambulatory Visit | Attending: Cardiology | Admitting: Cardiology

## 2020-09-20 ENCOUNTER — Other Ambulatory Visit: Payer: Self-pay

## 2020-09-20 DIAGNOSIS — M79604 Pain in right leg: Secondary | ICD-10-CM | POA: Diagnosis not present

## 2020-09-20 DIAGNOSIS — M79605 Pain in left leg: Secondary | ICD-10-CM | POA: Insufficient documentation

## 2020-09-21 ENCOUNTER — Telehealth: Payer: Self-pay | Admitting: Cardiovascular Disease

## 2020-09-21 ENCOUNTER — Ambulatory Visit (HOSPITAL_COMMUNITY)
Admission: RE | Admit: 2020-09-21 | Discharge: 2020-09-21 | Disposition: A | Discharge status: Home / Self Care | Payer: Medicare HMO | Source: Ambulatory Visit | Attending: Cardiovascular Disease | Admitting: Cardiovascular Disease

## 2020-09-21 DIAGNOSIS — I2510 Atherosclerotic heart disease of native coronary artery without angina pectoris: Secondary | ICD-10-CM | POA: Insufficient documentation

## 2020-09-21 DIAGNOSIS — R079 Chest pain, unspecified: Secondary | ICD-10-CM | POA: Diagnosis not present

## 2020-09-21 DIAGNOSIS — Z006 Encounter for examination for normal comparison and control in clinical research program: Secondary | ICD-10-CM

## 2020-09-21 DIAGNOSIS — I700 Atherosclerosis of aorta: Secondary | ICD-10-CM | POA: Insufficient documentation

## 2020-09-21 MED ORDER — NITROGLYCERIN 0.4 MG SL SUBL
0.8000 mg | SUBLINGUAL_TABLET | Freq: Once | SUBLINGUAL | Status: AC
  Administered 2020-09-21: 0.8 mg via SUBLINGUAL

## 2020-09-21 MED ORDER — IOHEXOL 350 MG/ML SOLN
95.0000 mL | Freq: Once | INTRAVENOUS | Status: AC | PRN
  Administered 2020-09-21: 95 mL via INTRAVENOUS

## 2020-09-21 MED ORDER — NITROGLYCERIN 0.4 MG SL SUBL
SUBLINGUAL_TABLET | SUBLINGUAL | Status: AC
  Filled 2020-09-21: qty 2

## 2020-09-21 NOTE — Telephone Encounter (Signed)
In with [facility] Radiology is calling to report CT morphe results.  Phone #: [phone]

## 2020-09-21 NOTE — Telephone Encounter (Signed)
Returned call to [place] regarding patients Coronary CT.   Per [facility] imaging the following was noted on CT over read of chest.   IMPRESSION:  1.  No acute findings in the imaged extracardiac chest.  2. Right lower lobe pulmonary nodules, including the dominant  macrolobulated central nodule of 2.1 x 1.5 cm. Similar over less  than 2 months. Recommend multidisciplinary thoracic oncology  consultation for possible three-month follow-up chest CT versus  further evaluation with PET.  3.  Aortic Atherosclerosis (ICD10-I70.0).   Per [facility] imaging this is not a critical result but wanted to make Dr. Audie Box aware that this was found. Advised I would forward to Dr. Audie Box- will also route to DOD (Dr. Gardiner Rhyme) as well since Dr. Audie Box is out of office.

## 2020-09-21 NOTE — Research (Signed)
IDENTIFY Informed Consent                  Subject Name: [name]    Subject met inclusion and exclusion criteria.  The informed consent form, study requirements and expectations were reviewed with the subject and questions and concerns were addressed prior to the signing of the consent form.  The subject verbalized understanding of the trial requirements.  The subject agreed to participate in the IDENTIFY trial and signed the informed consent at 13:51PM on 09/21/20.  The informed consent was obtained prior to performance of any protocol-specific procedures for the subject.  A copy of the signed informed consent was given to the subject and a copy was placed in the subject's medical record.    Ledon Snare , Research Assistant

## 2020-09-22 NOTE — Telephone Encounter (Signed)
Pulmonary has reviewed, ordered other testing.

## 2020-09-26 ENCOUNTER — Telehealth: Payer: Self-pay | Admitting: Pulmonary Disease

## 2020-09-26 DIAGNOSIS — R911 Solitary pulmonary nodule: Secondary | ICD-10-CM

## 2020-09-26 NOTE — Telephone Encounter (Signed)
[facility] Telephone Encounter  Called patient regarding recent CT Coronary which demonstrated stable right lower lobe pulmonary nodule measuring 2.1 x 1.5 cm with adjacent 86mm nodule. We re-discussed PET vs tissue sampling and after addressing questions regarding risks and benefits of imaging vs bronchoscopy, we will obtain PET-CT first.  Plan --Discontinue CT Chest scheduled on 10/11/20. --Order PET-CT  --Arrange for follow-up with me end of September after PET. Ok to [place] AM slots

## 2020-09-26 NOTE — Telephone Encounter (Signed)
PCC's can the CT scan be cancelled please and once the PET scan is scheduled we will need to get her scheduled to see JE after the PET scan.  Thanks

## 2020-09-27 NOTE — Telephone Encounter (Signed)
CT cancelled. PET sched for 9/16 at 1:00pm & f/u w/ JE sched for 9/30 at 2:30. Informed pt.

## 2020-09-29 DIAGNOSIS — J449 Chronic obstructive pulmonary disease, unspecified: Secondary | ICD-10-CM | POA: Diagnosis not present

## 2020-10-02 ENCOUNTER — Other Ambulatory Visit: Payer: Self-pay | Admitting: Physical Medicine and Rehabilitation

## 2020-10-03 ENCOUNTER — Other Ambulatory Visit: Payer: Self-pay

## 2020-10-03 DIAGNOSIS — G894 Chronic pain syndrome: Secondary | ICD-10-CM

## 2020-10-03 MED ORDER — HYDROCODONE-ACETAMINOPHEN 10-325 MG PO TABS: 1.0000 | ORAL_TABLET | Freq: Three times a day (TID) | ORAL | 0 refills | Status: DC | PRN

## 2020-10-11 ENCOUNTER — Other Ambulatory Visit: Payer: Self-pay

## 2020-10-11 ENCOUNTER — Ambulatory Visit (HOSPITAL_COMMUNITY): Payer: Medicare HMO | Attending: Cardiovascular Disease

## 2020-10-11 ENCOUNTER — Inpatient Hospital Stay: Admission: RE | Admit: 2020-10-11 | Payer: Medicare HMO | Source: Ambulatory Visit

## 2020-10-11 DIAGNOSIS — R079 Chest pain, unspecified: Secondary | ICD-10-CM

## 2020-10-11 LAB — ECHOCARDIOGRAM COMPLETE
Area-P 1/2: 3.72 cm2
S' Lateral: 3.5 cm

## 2020-10-12 DIAGNOSIS — J449 Chronic obstructive pulmonary disease, unspecified: Secondary | ICD-10-CM | POA: Diagnosis not present

## 2020-10-12 DIAGNOSIS — N189 Chronic kidney disease, unspecified: Secondary | ICD-10-CM | POA: Diagnosis not present

## 2020-10-13 ENCOUNTER — Encounter (HOSPITAL_COMMUNITY)
Admission: RE | Admit: 2020-10-13 | Discharge: 2020-10-13 | Disposition: A | Discharge status: Home / Self Care | Payer: Medicare HMO | Source: Ambulatory Visit | Attending: Pulmonary Disease | Admitting: Pulmonary Disease

## 2020-10-13 ENCOUNTER — Ambulatory Visit (HOSPITAL_COMMUNITY): Payer: Medicare HMO

## 2020-10-13 ENCOUNTER — Other Ambulatory Visit: Payer: Self-pay

## 2020-10-13 DIAGNOSIS — R911 Solitary pulmonary nodule: Secondary | ICD-10-CM | POA: Diagnosis present

## 2020-10-13 DIAGNOSIS — K319 Disease of stomach and duodenum, unspecified: Secondary | ICD-10-CM | POA: Diagnosis not present

## 2020-10-13 DIAGNOSIS — K5730 Diverticulosis of large intestine without perforation or abscess without bleeding: Secondary | ICD-10-CM | POA: Diagnosis not present

## 2020-10-13 DIAGNOSIS — J439 Emphysema, unspecified: Secondary | ICD-10-CM | POA: Diagnosis not present

## 2020-10-13 DIAGNOSIS — I700 Atherosclerosis of aorta: Secondary | ICD-10-CM | POA: Diagnosis not present

## 2020-10-13 LAB — GLUCOSE, CAPILLARY: Glucose-Capillary: 95 mg/dL (ref 70–99)

## 2020-10-13 MED ORDER — FLUDEOXYGLUCOSE F - 18 (FDG) INJECTION
11.0000 | Freq: Once | INTRAVENOUS | Status: AC | PRN
  Administered 2020-10-13: 11 via INTRAVENOUS

## 2020-10-16 ENCOUNTER — Telehealth: Payer: Self-pay | Admitting: Pulmonary Disease

## 2020-10-16 NOTE — Telephone Encounter (Signed)
[facility] Telephone Encounter  I contacted patient and discussed PET/CT results. We discussed hypermetabolic lesions in the right lower lung and near the descending duodenum. We discussed further diagnostic testing including tissue sampling either with EUS or navigational bronchoscopy. I have sent a message to her gastroenterologist regarding feasibility to sample metastatic lesion near the duodenum. Patient expressed understanding of plan.

## 2020-10-16 NOTE — Telephone Encounter (Signed)
Margaretha Seeds, MD     5:25 PM Note [facility] Telephone Encounter   Called patient regarding recent CT Coronary which demonstrated stable right lower lobe pulmonary nodule measuring 2.1 x 1.5 cm with adjacent 14mm nodule. We re-discussed PET vs tissue sampling and after addressing questions regarding risks and benefits of imaging vs bronchoscopy, we will obtain PET-CT first.   Plan --Discontinue CT Chest scheduled on 10/11/20. --Order PET-CT  --Arrange for follow-up with me end of September after PET. Ok to [place] AM slots      Pt currently has an appt scheduled 10/3 with Dr. Loanne Drilling which front scheduled for pt.  Called and spoke with pt stating to her that Dr. Loanne Drilling wanted her to have appt to discuss results of PET-CT and asked if she wanted to move her current up sooner and pt said that would be okay.  When looked at Dr. Cordelia Pen schedule, saw that Dr. Loanne Drilling is not in the office in the AM per stated message in plan as Dr. Loanne Drilling is only in office in PM next week on Thurs, 9/29 and Fri, 9/30. Pt said that her husband already has an appt scheduled around 1pm on 9/29 and she has an appt scheduled at 1:40 on 9/30 both appts that they do not want to reschedule.  Due to this, Dr. Loanne Drilling, please advise.

## 2020-10-17 ENCOUNTER — Other Ambulatory Visit: Payer: Self-pay

## 2020-10-17 ENCOUNTER — Encounter: Payer: Self-pay | Admitting: Gastroenterology

## 2020-10-17 ENCOUNTER — Encounter: Payer: Self-pay | Admitting: Physician Assistant

## 2020-10-17 DIAGNOSIS — K3189 Other diseases of stomach and duodenum: Secondary | ICD-10-CM

## 2020-10-17 NOTE — Telephone Encounter (Signed)
[facility] Telephone Encounter  Dr. Ardis Hughs (GI) agrees EUS would be appropriate to obtain tissue. Patient has been contacted via telephone regarding plan. GI staff will contact patient to schedule tentatively for September 22nd.

## 2020-10-18 NOTE — Anesthesia Preprocedure Evaluation (Addendum)
Anesthesia Evaluation  Patient identified by MRN, date of birth, ID band Patient awake    Reviewed: Allergy & Precautions, NPO status , Patient's Chart, lab work & pertinent test results, reviewed documented beta blocker date and time   Airway Mallampati: III  TM Distance: >3 FB Neck ROM: Full    Dental  (+) Edentulous Upper, Missing, Dental Advisory Given,    Pulmonary sleep apnea (uses oxygen at night) and Oxygen sleep apnea , COPD,  COPD inhaler, Current Smoker and Patient abstained from smoking.,  69 pack year history Smokes 2ppd for many years- uses albuterol rescue inhaler every other day    Pulmonary exam normal breath sounds clear to auscultation       Cardiovascular hypertension, Pt. on medications and Pt. on home beta blockers Normal cardiovascular exam Rhythm:Regular Rate:Normal  Echo 10/11/20: 1. Left ventricular ejection fraction, by estimation, is 60 to 65%. The  left ventricle has normal function. The left ventricle has no regional  wall motion abnormalities. Left ventricular diastolic parameters were  normal.  2. Right ventricular systolic function is normal. The right ventricular  size is normal.  3. The mitral valve is normal in structure. Trivial mitral valve  regurgitation.  4. The aortic valve is normal in structure. Aortic valve regurgitation is  not visualized. No aortic stenosis is present.   Cath 2017 after positive stress test: Angiographically no significant coronary disease with relatively minimal inferior wall perfusion with a small caliber PDA and no significant posterior lateral branching. However there is no lesion to explain the patient's inferior ischemia.   Neuro/Psych  Headaches, PSYCHIATRIC DISORDERS Anxiety Depression alprazolam   GI/Hepatic Neg liver ROS, GERD  Medicated and Controlled,periduodenal lesion  Chronic nausea- takes zofran    Endo/Other  diabetes, Well  ControlledHypothyroidism a1c 6.7  Renal/GU negative Renal ROS  negative genitourinary   Musculoskeletal  (+) Arthritis , Osteoarthritis,  Chronic lortab, flexeril for pain   Abdominal (+) + obese,   Peds  Hematology negative hematology ROS (+)   Anesthesia Other Findings   Reproductive/Obstetrics negative OB ROS                           Anesthesia Physical Anesthesia Plan  ASA: 3  Anesthesia Plan: MAC   Post-op Pain Management:    Induction:   PONV Risk Score and Plan: 2 and Propofol infusion and TIVA  Airway Management Planned: Natural Airway and Simple Face Mask  Additional Equipment: None  Intra-op Plan:   Post-operative Plan:   Informed Consent: I have reviewed the patients History and Physical, chart, labs and discussed the procedure including the risks, benefits and alternatives for the proposed anesthesia with the patient or authorized representative who has indicated his/her understanding and acceptance.     Dental advisory given  Plan Discussed with: CRNA  Anesthesia Plan Comments: (Requesting preop anxiolysis )       Anesthesia Quick Evaluation

## 2020-10-19 ENCOUNTER — Ambulatory Visit (HOSPITAL_COMMUNITY): Payer: Medicare HMO | Admitting: Certified Registered"

## 2020-10-19 ENCOUNTER — Other Ambulatory Visit: Payer: Self-pay

## 2020-10-19 ENCOUNTER — Ambulatory Visit (HOSPITAL_COMMUNITY)
Admission: RE | Admit: 2020-10-19 | Discharge: 2020-10-19 | Disposition: A | Discharge status: Home / Self Care | Payer: Medicare HMO | Attending: Gastroenterology | Admitting: Gastroenterology

## 2020-10-19 ENCOUNTER — Encounter (HOSPITAL_COMMUNITY): Payer: Self-pay | Admitting: Gastroenterology

## 2020-10-19 ENCOUNTER — Encounter (HOSPITAL_COMMUNITY)
Admission: RE | Disposition: A | Discharge status: Home / Self Care | Payer: Self-pay | Source: Home / Self Care | Attending: Gastroenterology

## 2020-10-19 DIAGNOSIS — F17210 Nicotine dependence, cigarettes, uncomplicated: Secondary | ICD-10-CM | POA: Diagnosis not present

## 2020-10-19 DIAGNOSIS — E785 Hyperlipidemia, unspecified: Secondary | ICD-10-CM | POA: Diagnosis not present

## 2020-10-19 DIAGNOSIS — Z888 Allergy status to other drugs, medicaments and biological substances status: Secondary | ICD-10-CM | POA: Insufficient documentation

## 2020-10-19 DIAGNOSIS — Z882 Allergy status to sulfonamides status: Secondary | ICD-10-CM | POA: Insufficient documentation

## 2020-10-19 DIAGNOSIS — K219 Gastro-esophageal reflux disease without esophagitis: Secondary | ICD-10-CM | POA: Diagnosis not present

## 2020-10-19 DIAGNOSIS — Z880 Allergy status to penicillin: Secondary | ICD-10-CM | POA: Diagnosis not present

## 2020-10-19 DIAGNOSIS — Z90710 Acquired absence of both cervix and uterus: Secondary | ICD-10-CM | POA: Diagnosis not present

## 2020-10-19 DIAGNOSIS — J449 Chronic obstructive pulmonary disease, unspecified: Secondary | ICD-10-CM | POA: Diagnosis not present

## 2020-10-19 DIAGNOSIS — C170 Malignant neoplasm of duodenum: Secondary | ICD-10-CM | POA: Diagnosis not present

## 2020-10-19 DIAGNOSIS — Z90721 Acquired absence of ovaries, unilateral: Secondary | ICD-10-CM | POA: Insufficient documentation

## 2020-10-19 DIAGNOSIS — D509 Iron deficiency anemia, unspecified: Secondary | ICD-10-CM | POA: Diagnosis not present

## 2020-10-19 DIAGNOSIS — Z8249 Family history of ischemic heart disease and other diseases of the circulatory system: Secondary | ICD-10-CM | POA: Diagnosis not present

## 2020-10-19 DIAGNOSIS — Z8619 Personal history of other infectious and parasitic diseases: Secondary | ICD-10-CM | POA: Insufficient documentation

## 2020-10-19 DIAGNOSIS — K3189 Other diseases of stomach and duodenum: Secondary | ICD-10-CM

## 2020-10-19 DIAGNOSIS — Z8719 Personal history of other diseases of the digestive system: Secondary | ICD-10-CM | POA: Diagnosis not present

## 2020-10-19 DIAGNOSIS — Z96641 Presence of right artificial hip joint: Secondary | ICD-10-CM | POA: Insufficient documentation

## 2020-10-19 DIAGNOSIS — R933 Abnormal findings on diagnostic imaging of other parts of digestive tract: Secondary | ICD-10-CM | POA: Diagnosis not present

## 2020-10-19 DIAGNOSIS — Z885 Allergy status to narcotic agent status: Secondary | ICD-10-CM | POA: Diagnosis not present

## 2020-10-19 DIAGNOSIS — I10 Essential (primary) hypertension: Secondary | ICD-10-CM | POA: Insufficient documentation

## 2020-10-19 DIAGNOSIS — R69 Illness, unspecified: Secondary | ICD-10-CM | POA: Diagnosis not present

## 2020-10-19 DIAGNOSIS — E119 Type 2 diabetes mellitus without complications: Secondary | ICD-10-CM | POA: Diagnosis not present

## 2020-10-19 DIAGNOSIS — Z9049 Acquired absence of other specified parts of digestive tract: Secondary | ICD-10-CM | POA: Insufficient documentation

## 2020-10-19 DIAGNOSIS — Z9981 Dependence on supplemental oxygen: Secondary | ICD-10-CM | POA: Diagnosis not present

## 2020-10-19 DIAGNOSIS — E039 Hypothyroidism, unspecified: Secondary | ICD-10-CM | POA: Diagnosis not present

## 2020-10-19 HISTORY — PX: ESOPHAGOGASTRODUODENOSCOPY (EGD) WITH PROPOFOL: SHX5813

## 2020-10-19 HISTORY — PX: UPPER ESOPHAGEAL ENDOSCOPIC ULTRASOUND (EUS): SHX6562

## 2020-10-19 HISTORY — PX: FINE NEEDLE ASPIRATION: SHX5430

## 2020-10-19 LAB — GLUCOSE, CAPILLARY: Glucose-Capillary: 87 mg/dL (ref 70–99)

## 2020-10-19 SURGERY — UPPER ESOPHAGEAL ENDOSCOPIC ULTRASOUND (EUS)
Anesthesia: Monitor Anesthesia Care

## 2020-10-19 MED ORDER — ONDANSETRON HCL 4 MG/2ML IJ SOLN
4.0000 mg | Freq: Once | INTRAMUSCULAR | Status: AC
  Administered 2020-10-19: 4 mg via INTRAVENOUS

## 2020-10-19 MED ORDER — LIDOCAINE VISCOUS HCL 2 % MT SOLN
OROMUCOSAL | Status: AC
  Filled 2020-10-19: qty 15

## 2020-10-19 MED ORDER — ONDANSETRON HCL 4 MG/2ML IJ SOLN
INTRAMUSCULAR | Status: DC | PRN
  Administered 2020-10-19: 4 mg via INTRAVENOUS

## 2020-10-19 MED ORDER — GLUCAGON HCL RDNA (DIAGNOSTIC) 1 MG IJ SOLR
INTRAMUSCULAR | Status: DC | PRN
  Administered 2020-10-19: .5 mg via INTRAVENOUS

## 2020-10-19 MED ORDER — MIDAZOLAM HCL 2 MG/2ML IJ SOLN
INTRAMUSCULAR | Status: AC
  Filled 2020-10-19: qty 2

## 2020-10-19 MED ORDER — KETAMINE HCL 10 MG/ML IJ SOLN
INTRAMUSCULAR | Status: DC | PRN
  Administered 2020-10-19 (×6): 5 mg via INTRAVENOUS

## 2020-10-19 MED ORDER — LIDOCAINE HCL URETHRAL/MUCOSAL 2 % EX GEL
CUTANEOUS | Status: DC | PRN
  Administered 2020-10-19: 1 via TOPICAL

## 2020-10-19 MED ORDER — PROPOFOL 500 MG/50ML IV EMUL
INTRAVENOUS | Status: DC | PRN
  Administered 2020-10-19: 150 ug/kg/min via INTRAVENOUS

## 2020-10-19 MED ORDER — MIDAZOLAM HCL 5 MG/5ML IJ SOLN
INTRAMUSCULAR | Status: DC | PRN
  Administered 2020-10-19: 2 mg via INTRAVENOUS

## 2020-10-19 MED ORDER — GLYCOPYRROLATE 0.2 MG/ML IJ SOLN
INTRAMUSCULAR | Status: DC | PRN
  Administered 2020-10-19 (×2): .1 mg via INTRAVENOUS

## 2020-10-19 MED ORDER — KETAMINE HCL 10 MG/ML IJ SOLN
INTRAMUSCULAR | Status: AC
  Filled 2020-10-19: qty 1

## 2020-10-19 MED ORDER — PROPOFOL 10 MG/ML IV BOLUS
INTRAVENOUS | Status: DC | PRN
  Administered 2020-10-19: 20 mg via INTRAVENOUS

## 2020-10-19 MED ORDER — LACTATED RINGERS IV SOLN
INTRAVENOUS | Status: DC
  Administered 2020-10-19: 1000 mL via INTRAVENOUS

## 2020-10-19 MED ORDER — LIDOCAINE HCL (CARDIAC) PF 100 MG/5ML IV SOSY
PREFILLED_SYRINGE | INTRAVENOUS | Status: DC | PRN
  Administered 2020-10-19: 60 mg via INTRAVENOUS

## 2020-10-19 NOTE — H&P (Signed)
HPI: This is a woman with pet avid nodule near duodenum    ROS: complete GI ROS as described in HPI, all other review negative.  Constitutional:  No unintentional weight loss   Past Medical History:  Diagnosis Date   Allergic rhinitis    Anemia    Anxiety    Chicken pox    Chronic back pain    COPD (chronic obstructive pulmonary disease) (HCC)    Depression    DM (diabetes mellitus) ([place])    Essential hypertension    GERD (gastroesophageal reflux disease)    Headache    migraines   History of gastritis    EGD 2015   History of home oxygen therapy    2 liters at hs last 6 months   Hyperlipidemia    Hypothyroidism    Migraines    Osteoarthritis    oa   Scoliosis     Past Surgical History:  Procedure Laterality Date   APPENDECTOMY     1985   BIOPSY  07/24/2017   Procedure: BIOPSY;  Surgeon: Milus Banister, MD;  Location: [facility];  Service: Endoscopy;;   CARDIAC CATHETERIZATION N/A 10/31/2015   Procedure: Left Heart Cath and Coronary Angiography;  Surgeon: Leonie Man, MD;  Location: [facility];  Service: Cardiovascular;  Laterality: N/A;   CARPAL TUNNEL RELEASE Left    CARPAL TUNNEL RELEASE Right    CHOLECYSTECTOMY  late 1980's   COLONOSCOPY WITH PROPOFOL N/A 07/24/2017   Procedure: COLONOSCOPY WITH PROPOFOL;  Surgeon: Milus Banister, MD;  Location: [facility];  Service: Endoscopy;  Laterality: N/A;   ESOPHAGOGASTRODUODENOSCOPY N/A 07/24/2017   Procedure: ESOPHAGOGASTRODUODENOSCOPY (EGD);  Surgeon: Milus Banister, MD;  Location: [facility];  Service: Endoscopy;  Laterality: N/A;   GALLBLADDER SURGERY  1991   HIP CLOSED REDUCTION Right 01/08/2016   Procedure: CLOSED MANIPULATION HIP;  Surgeon: Susa Day, MD;  Location: [facility];  Service: Orthopedics;  Laterality: Right;   HIP CLOSED REDUCTION Right 01/19/2016   Procedure: ATTEMPTED CLOSED REDUCTION RIGHT HIP;  Surgeon: Wylene Simmer, MD;  Location: [facility];  Service: Orthopedics;  Laterality:  Right;   HIP CLOSED REDUCTION Right 01/20/2016   Procedure: CLOSED REDUCTION RIGHT TOTAL HIP;  Surgeon: Paralee Cancel, MD;  Location: [facility];  Service: Orthopedics;  Laterality: Right;   HIP CLOSED REDUCTION Right 02/17/2016   Procedure: CLOSED REDUCTION RIGHT TOTAL HIP;  Surgeon: Rod Can, MD;  Location: [place];  Service: Orthopedics;  Laterality: Right;   HIP CLOSED REDUCTION Right 02/28/2016   Procedure: CLOSED REDUCTION HIP;  Surgeon: Nicholes Stairs, MD;  Location: [facility];  Service: Orthopedics;  Laterality: Right;   POLYPECTOMY  07/24/2017   Procedure: POLYPECTOMY;  Surgeon: Milus Banister, MD;  Location: [facility];  Service: Endoscopy;;   [place], with 1 ovary removed and 2 nd ovary removed 2003   TOTAL HIP ARTHROPLASTY Right    Original surgery 2006 with revision 2010   TOTAL HIP REVISION Right 01/01/2016   Procedure: TOTAL HIP REVISION;  Surgeon: Paralee Cancel, MD;  Location: [facility];  Service: Orthopedics;  Laterality: Right;   TOTAL HIP REVISION Right 03/02/2016   Procedure: TOTAL HIP REVISION;  Surgeon: Paralee Cancel, MD;  Location: [facility];  Service: Orthopedics;  Laterality: Right;   TOTAL HIP REVISION Right 09/02/2016   Procedure: Right hip constrained liner- posterior;  Surgeon: Paralee Cancel, MD;  Location: [facility];  Service:  Orthopedics;  Laterality: Right;   ULNAR NERVE TRANSPOSITION Right     No current facility-administered medications for this encounter.    Allergies as of 10/17/2020 - Review Complete 10/17/2020  Allergen Reaction Noted   Metformin and related Diarrhea 05/03/2020   Nsaids Diarrhea 06/10/2017   Aleve [naproxen sodium] Other (See Comments) 10/26/2015   Codeine Nausea Only and Other (See Comments)    Penicillins Nausea Only and Other (See Comments)    Sulfonamide derivatives Hives     Family History  Problem Relation Age of Onset   COPD Mother    Heart disease Mother    Lung disease Father         Asbestosis   Heart attack Father    Heart disease Father    Cerebral aneurysm Brother    Aneurysm Brother        Brain   Drug abuse Daughter    Epilepsy Son    Alcohol abuse Son    Drug abuse Son    Arthritis Maternal Grandmother    Heart disease Maternal Grandmother    Asthma Maternal Grandfather    Cancer Maternal Grandfather    Arthritis Paternal Grandmother    Heart disease Paternal Grandmother    Stroke Paternal Grandmother    Early death Paternal Grandfather    Heart disease Paternal Grandfather     Social History   Socioeconomic History   Marital status: Married    Spouse name: Not on file   Number of children: 2   Years of education: Not on file   Highest education level: Not on file  Occupational History   Occupation: disabled   Occupation: disabled  Tobacco Use   Smoking status: Every Day    Packs/day: 1.50    Years: 46.00    Pack years: 69.00    Types: Cigarettes   Smokeless tobacco: Never   Tobacco comments:    tobacco info given  Vaping Use   Vaping Use: Never used  Substance and Sexual Activity   Alcohol use: No   Drug use: No   Sexual activity: Not Currently    Partners: Male  Other Topics Concern   Not on file  Social History Narrative   Right handed    Caffeine~ 2 cups per day    Lives at home with husband (strained relationship)   Primary caretaker for disabled brother who had aneurism   Daughter died July 06, 2018    Social Determinants of Health   Financial Resource Strain: Low Risk    Difficulty of Paying Living Expenses: Not hard at all  Food Insecurity: Not on file  Transportation Needs: No Transportation Needs   Lack of Transportation (Medical): No   Lack of Transportation (Non-Medical): No  Physical Activity: Inactive   Days of Exercise per Week: 0 days   Minutes of Exercise per Session: 0 min  Stress: Stress Concern Present   Feeling of Stress : To some extent  Social Connections: Moderately Integrated   Frequency of  Communication with Friends and Family: More than three times a week   Frequency of Social Gatherings with Friends and Family: Once a week   Attends Religious Services: 1 to 4 times per year   Active Member of [employer] or Organizations: No   Attends Archivist Meetings: Never   Marital Status: Married  Human resources officer Violence: Not At Risk   Fear of Current or Ex-Partner: No   Emotionally Abused: No   Physically Abused: No   Sexually Abused:  No     Physical Exam: There were no vitals taken for this visit. Constitutional: generally well-appearing Psychiatric: alert and oriented x3 Abdomen: soft, nontender, nondistended, no obvious ascites, no peritoneal signs, normal bowel sounds No peripheral edema noted in lower extremities  Assessment and plan: 65 y.o. female with pet avid nodule near duodenum  May represent metastatic lung cancer.  EUS with FNA today  Please see the "Patient Instructions" section for addition details about the plan.  Owens Loffler, MD [facility] 10/19/2020, 8:16 AM

## 2020-10-19 NOTE — Discharge Instructions (Signed)

## 2020-10-19 NOTE — Anesthesia Postprocedure Evaluation (Signed)
Anesthesia Post Note  Patient: [name]  Procedure(s) Performed: UPPER ESOPHAGEAL ENDOSCOPIC ULTRASOUND (EUS) FINE NEEDLE ASPIRATION (FNA) LINEAR     Patient location during evaluation: PACU Anesthesia Type: MAC Level of consciousness: awake and alert Pain management: pain level controlled Vital Signs Assessment: post-procedure vital signs reviewed and stable Respiratory status: spontaneous breathing, nonlabored ventilation and respiratory function stable Cardiovascular status: blood pressure returned to baseline and stable Postop Assessment: no apparent nausea or vomiting Anesthetic complications: no   No notable events documented.  Last Vitals:  Vitals:   10/19/20 1048 10/19/20 1050  BP: (!) 164/73 (!) 147/82  Pulse: 85   Resp: 13 14  Temp:    SpO2: 94%     Last Pain:  Vitals:   10/19/20 1050  TempSrc:   PainSc: 0-No pain                 Pervis Hocking

## 2020-10-19 NOTE — Transfer of Care (Signed)
Immediate Anesthesia Transfer of Care Note  Patient: [name]  Procedure(s) Performed: UPPER ESOPHAGEAL ENDOSCOPIC ULTRASOUND (EUS) FINE NEEDLE ASPIRATION (FNA) LINEAR  Patient Location: PACU  Anesthesia Type:MAC  Level of Consciousness: awake, alert  and oriented  Airway & Oxygen Therapy: Patient Spontanous Breathing  Post-op Assessment: Report given to RN and Post -op Vital signs reviewed and stable  Post vital signs: Reviewed and stable  Last Vitals:  Vitals Value Taken Time  BP 147/78 10/19/20 1030  Temp    Pulse 87 10/19/20 1032  Resp 15 10/19/20 1032  SpO2 94 % 10/19/20 1032  Vitals shown include unvalidated device data.  Last Pain:  Vitals:   10/19/20 0841  TempSrc: Oral  PainSc: 5       Patients Stated Pain Goal: 3 (80/06/34 9494)  Complications: No notable events documented.

## 2020-10-19 NOTE — Op Note (Signed)
[facility] Patient Name: [name] Procedure Date: 10/19/2020 MRN: [id number] Attending MD: Milus Banister , MD Date of Birth: 1956-01-26 CSN: [id number] Age: 65 Admit Type: Outpatient Procedure:                Upper EUS Indications:              PET avid periduodenal mass and also right lung mass Providers:                Milus Banister, MD, Kary Kos, RN, Hinton Dyer Referring MD:             Rodman Pickle, MD Medicines:                Monitored Anesthesia Care Complications:            No immediate complications. Estimated Blood Loss:     Estimated blood loss: none. Procedure:                Pre-Anesthesia Assessment:                           - Prior to the procedure, a History and Physical                            was performed, and patient medications and                            allergies were reviewed. The patient's tolerance of                            previous anesthesia was also reviewed. The risks                            and benefits of the procedure and the sedation                            options and risks were discussed with the patient.                            All questions were answered, and informed consent                            was obtained. Prior Anticoagulants: The patient has                            taken no previous anticoagulant or antiplatelet                            agents. ASA Grade Assessment: III - A patient with                            severe systemic disease. After reviewing the risks                            and benefits, the patient was deemed in  satisfactory condition to undergo the procedure.                           After obtaining informed consent, the endoscope was                            passed under direct vision. Throughout the                            procedure, the patient's blood pressure, pulse, and                            oxygen saturations were  monitored continuously. The                            GF-[id number]-[id number] ([id number]) Olympus radial ultrasound                            scope was introduced through the mouth, and                            advanced to the second part of duodenum. The                            [id number] ([id number]) Olympus linear ultrasound scope                            was introduced through the mouth, and advanced to                            the second part of duodenum. The upper EUS was                            accomplished without difficulty. The patient                            tolerated the procedure well. Scope In: Scope Out: Findings:      ENDOSCOPIC FINDING: :      The examined esophagus was endoscopically normal.      Mild, non-specific pangastritis.      The examined duodenum was endoscopically normal. No subepithelial       lesions noted.      ENDOSONOGRAPHIC FINDING: :      1. 1.7cm hypoechoic, very irregularly bordered mass involving the outer       wall (muscularis propria layer) of the second duodenum. This was sampled       with 2 transduodenal passes with a 22 gauge EUS FNA (acquire) needle.       Preliminary cytology review was positive for malignancy (favoring small       cell currently).      2. No periduodenal adenopathy.      3. CBD was 6.60mm across.      4. Pancreatic parenchyma and main pancreatic duct were normal.      5. Limited views of the liver were normal. Impression:               -  1.7cm mass involving the outer wall of the second                            duodenum. Preliminary cytology review was positive                            for malignancy, currently favoring small cell.                            Await final cytology testing. Moderate Sedation:      Not Applicable - Patient had care per Anesthesia. Recommendation:           - Await cytology results. Procedure Code(s):        --- Professional ---                           [phone],  Esophagogastroduodenoscopy, flexible,                            transoral; with transendoscopic ultrasound-guided                            intramural or transmural fine needle                            aspiration/biopsy(s), (includes endoscopic                            ultrasound examination limited to the esophagus,                            stomach or duodenum, and adjacent structures) Diagnosis Code(s):        --- Professional ---                           K31.89, Other diseases of stomach and duodenum                           R93.3, Abnormal findings on diagnostic imaging of                            other parts of digestive tract CPT copyright 2019 American Medical Association. All rights reserved. The codes documented in this report are preliminary and upon coder review may  be revised to meet current compliance requirements. Milus Banister, MD 10/19/2020 10:27:39 AM This report has been signed electronically. Number of Addenda: 0

## 2020-10-20 ENCOUNTER — Telehealth: Payer: Self-pay

## 2020-10-20 ENCOUNTER — Telehealth: Payer: Self-pay | Admitting: Hematology and Oncology

## 2020-10-20 ENCOUNTER — Encounter (HOSPITAL_COMMUNITY): Payer: Self-pay | Admitting: Gastroenterology

## 2020-10-20 NOTE — Telephone Encounter (Signed)
Noted. Responded in [place].

## 2020-10-20 NOTE — Telephone Encounter (Signed)
Message has been given to [place], she will get in contact with the pt.

## 2020-10-20 NOTE — Telephone Encounter (Signed)
FYI

## 2020-10-20 NOTE — Telephone Encounter (Signed)
PT called requesting to speak to [facility] and her nurse. I asked what message I could give and she stated that [place] knows. Adonis would like a call. Please Advise

## 2020-10-20 NOTE — Telephone Encounter (Signed)
Scheduled appt per staff msg from Dr. Lorenso Courier. Pt is aware of appt date and time. Pt is also aware to arrive 15 mins early.

## 2020-10-23 LAB — CYTOLOGY - NON PAP

## 2020-10-26 ENCOUNTER — Inpatient Hospital Stay: Payer: Medicare HMO | Attending: Hematology and Oncology | Admitting: Hematology and Oncology

## 2020-10-26 ENCOUNTER — Inpatient Hospital Stay: Payer: Medicare HMO

## 2020-10-26 ENCOUNTER — Other Ambulatory Visit: Payer: Self-pay

## 2020-10-26 ENCOUNTER — Encounter: Payer: Self-pay | Admitting: *Deleted

## 2020-10-26 VITALS — BP 161/87 | HR 85 | Temp 98.0°F | Resp 18 | Wt 210.2 lb

## 2020-10-26 DIAGNOSIS — M549 Dorsalgia, unspecified: Secondary | ICD-10-CM

## 2020-10-26 DIAGNOSIS — F17210 Nicotine dependence, cigarettes, uncomplicated: Secondary | ICD-10-CM | POA: Insufficient documentation

## 2020-10-26 DIAGNOSIS — R599 Enlarged lymph nodes, unspecified: Secondary | ICD-10-CM | POA: Diagnosis not present

## 2020-10-26 DIAGNOSIS — M419 Scoliosis, unspecified: Secondary | ICD-10-CM | POA: Diagnosis not present

## 2020-10-26 DIAGNOSIS — C3490 Malignant neoplasm of unspecified part of unspecified bronchus or lung: Secondary | ICD-10-CM

## 2020-10-26 DIAGNOSIS — Z880 Allergy status to penicillin: Secondary | ICD-10-CM | POA: Diagnosis not present

## 2020-10-26 DIAGNOSIS — Z9049 Acquired absence of other specified parts of digestive tract: Secondary | ICD-10-CM

## 2020-10-26 DIAGNOSIS — I700 Atherosclerosis of aorta: Secondary | ICD-10-CM | POA: Diagnosis not present

## 2020-10-26 DIAGNOSIS — J439 Emphysema, unspecified: Secondary | ICD-10-CM | POA: Diagnosis not present

## 2020-10-26 DIAGNOSIS — Z823 Family history of stroke: Secondary | ICD-10-CM | POA: Insufficient documentation

## 2020-10-26 DIAGNOSIS — Z8719 Personal history of other diseases of the digestive system: Secondary | ICD-10-CM | POA: Insufficient documentation

## 2020-10-26 DIAGNOSIS — R634 Abnormal weight loss: Secondary | ICD-10-CM

## 2020-10-26 DIAGNOSIS — C3431 Malignant neoplasm of lower lobe, right bronchus or lung: Secondary | ICD-10-CM

## 2020-10-26 DIAGNOSIS — E785 Hyperlipidemia, unspecified: Secondary | ICD-10-CM | POA: Diagnosis not present

## 2020-10-26 DIAGNOSIS — Z886 Allergy status to analgesic agent status: Secondary | ICD-10-CM | POA: Insufficient documentation

## 2020-10-26 DIAGNOSIS — Z885 Allergy status to narcotic agent status: Secondary | ICD-10-CM | POA: Insufficient documentation

## 2020-10-26 DIAGNOSIS — E119 Type 2 diabetes mellitus without complications: Secondary | ICD-10-CM | POA: Diagnosis not present

## 2020-10-26 DIAGNOSIS — Z79899 Other long term (current) drug therapy: Secondary | ICD-10-CM | POA: Insufficient documentation

## 2020-10-26 DIAGNOSIS — G4730 Sleep apnea, unspecified: Secondary | ICD-10-CM | POA: Insufficient documentation

## 2020-10-26 DIAGNOSIS — Z809 Family history of malignant neoplasm, unspecified: Secondary | ICD-10-CM

## 2020-10-26 DIAGNOSIS — Z90721 Acquired absence of ovaries, unilateral: Secondary | ICD-10-CM

## 2020-10-26 DIAGNOSIS — Z814 Family history of other substance abuse and dependence: Secondary | ICD-10-CM | POA: Insufficient documentation

## 2020-10-26 DIAGNOSIS — Z882 Allergy status to sulfonamides status: Secondary | ICD-10-CM | POA: Diagnosis not present

## 2020-10-26 DIAGNOSIS — I10 Essential (primary) hypertension: Secondary | ICD-10-CM | POA: Diagnosis not present

## 2020-10-26 DIAGNOSIS — Z8261 Family history of arthritis: Secondary | ICD-10-CM

## 2020-10-26 DIAGNOSIS — E039 Hypothyroidism, unspecified: Secondary | ICD-10-CM

## 2020-10-26 DIAGNOSIS — R69 Illness, unspecified: Secondary | ICD-10-CM | POA: Diagnosis not present

## 2020-10-26 DIAGNOSIS — M1990 Unspecified osteoarthritis, unspecified site: Secondary | ICD-10-CM | POA: Diagnosis not present

## 2020-10-26 DIAGNOSIS — F419 Anxiety disorder, unspecified: Secondary | ICD-10-CM | POA: Diagnosis not present

## 2020-10-26 DIAGNOSIS — R079 Chest pain, unspecified: Secondary | ICD-10-CM | POA: Diagnosis not present

## 2020-10-26 DIAGNOSIS — Z836 Family history of other diseases of the respiratory system: Secondary | ICD-10-CM | POA: Insufficient documentation

## 2020-10-26 DIAGNOSIS — Z66 Do not resuscitate: Secondary | ICD-10-CM | POA: Insufficient documentation

## 2020-10-26 DIAGNOSIS — Z8249 Family history of ischemic heart disease and other diseases of the circulatory system: Secondary | ICD-10-CM | POA: Insufficient documentation

## 2020-10-26 DIAGNOSIS — Z811 Family history of alcohol abuse and dependence: Secondary | ICD-10-CM

## 2020-10-26 DIAGNOSIS — Z820 Family history of epilepsy and other diseases of the nervous system: Secondary | ICD-10-CM | POA: Insufficient documentation

## 2020-10-26 LAB — CBC WITH DIFFERENTIAL (CANCER CENTER ONLY)
Abs Immature Granulocytes: 0.07 10*3/uL (ref 0.00–0.07)
Basophils Absolute: 0.1 10*3/uL (ref 0.0–0.1)
Basophils Relative: 1 %
Eosinophils Absolute: 0.2 10*3/uL (ref 0.0–0.5)
Eosinophils Relative: 2 %
HCT: 45.3 % (ref 36.0–46.0)
Hemoglobin: 14.4 g/dL (ref 12.0–15.0)
Immature Granulocytes: 1 %
Lymphocytes Relative: 31 %
Lymphs Abs: 4.1 10*3/uL — ABNORMAL HIGH (ref 0.7–4.0)
MCH: 26 pg (ref 26.0–34.0)
MCHC: 31.8 g/dL (ref 30.0–36.0)
MCV: 81.8 fL (ref 80.0–100.0)
Monocytes Absolute: 1.1 10*3/uL — ABNORMAL HIGH (ref 0.1–1.0)
Monocytes Relative: 8 %
Neutro Abs: 7.7 10*3/uL (ref 1.7–7.7)
Neutrophils Relative %: 57 %
Platelet Count: 272 10*3/uL (ref 150–400)
RBC: 5.54 MIL/uL — ABNORMAL HIGH (ref 3.87–5.11)
RDW: 17.4 % — ABNORMAL HIGH (ref 11.5–15.5)
WBC Count: 13.2 10*3/uL — ABNORMAL HIGH (ref 4.0–10.5)
nRBC: 0 % (ref 0.0–0.2)

## 2020-10-26 LAB — CMP (CANCER CENTER ONLY)
ALT: 21 U/L (ref 0–44)
AST: 25 U/L (ref 15–41)
Albumin: 3.7 g/dL (ref 3.5–5.0)
Alkaline Phosphatase: 94 U/L (ref 38–126)
Anion gap: 11 (ref 5–15)
BUN: 10 mg/dL (ref 8–23)
CO2: 25 mmol/L (ref 22–32)
Calcium: 9.2 mg/dL (ref 8.9–10.3)
Chloride: 97 mmol/L — ABNORMAL LOW (ref 98–111)
Creatinine: 1.01 mg/dL — ABNORMAL HIGH (ref 0.44–1.00)
GFR, Estimated: >60 mL/min (ref >60)
Glucose, Bld: 90 mg/dL (ref 70–99)
Potassium: 4.5 mmol/L (ref 3.5–5.1)
Sodium: 133 mmol/L — ABNORMAL LOW (ref 135–145)
Total Bilirubin: 0.5 mg/dL (ref 0.3–1.2)
Total Protein: 7.5 g/dL (ref 6.5–8.1)

## 2020-10-26 NOTE — Progress Notes (Signed)
Oncology Nurse Navigator Documentation  Oncology Nurse Navigator Flowsheets 10/26/2020  Abnormal Finding Date 07/05/2020  Confirmed Diagnosis Date 10/19/2020  Diagnosis Status Confirmed Diagnosis Complete  Planned Course of Treatment Chemotherapy  Phase of Treatment Chemo  Navigator Follow Up Date: 10/30/2020  Navigator Follow Up Reason: Appointment Review  Navigator Location [facility]  Referral Date to RadOnc/MedOnc 10/20/2020  Navigator Encounter Type Clinic/MDC;Initial MedOnc  Patient Visit Type Initial;MedOnc  Treatment Phase Pre-Tx/Tx Discussion  Barriers/Navigation Needs Education  Education Newly Diagnosed Cancer Education;Smoking cessation;Other  Interventions Education;Psycho-Social Support  Acuity Level 2-Minimal Needs (1-2 Barriers Identified)  Education Method Written;Verbal  Support Groups/Services Other  Time Spent with Patient 30

## 2020-10-27 ENCOUNTER — Encounter: Payer: Medicare HMO | Admitting: Physical Medicine and Rehabilitation

## 2020-10-27 ENCOUNTER — Encounter: Payer: Self-pay | Admitting: Hematology and Oncology

## 2020-10-27 ENCOUNTER — Telehealth: Payer: Self-pay

## 2020-10-27 ENCOUNTER — Ambulatory Visit: Payer: Medicare HMO | Admitting: Pulmonary Disease

## 2020-10-27 DIAGNOSIS — C3490 Malignant neoplasm of unspecified part of unspecified bronchus or lung: Secondary | ICD-10-CM | POA: Insufficient documentation

## 2020-10-27 MED ORDER — ONDANSETRON HCL 8 MG PO TABS: 8.0000 mg | ORAL_TABLET | Freq: Three times a day (TID) | ORAL | 0 refills | Status: DC | PRN

## 2020-10-27 MED ORDER — PROCHLORPERAZINE MALEATE 10 MG PO TABS: 10.0000 mg | ORAL_TABLET | Freq: Four times a day (QID) | ORAL | 0 refills | Status: DC | PRN

## 2020-10-27 MED ORDER — LIDOCAINE-PRILOCAINE 2.5-2.5 % EX CREA: 1.0000 "application " | TOPICAL_CREAM | CUTANEOUS | 0 refills | Status: DC | PRN

## 2020-10-27 NOTE — Telephone Encounter (Signed)
Notified Patient of Prior Authorization Approval for Lidocaine-Prilocaine 2.5% Cream. Medication is approved through 01/25/2021. No other need verbalized at this time.

## 2020-10-27 NOTE — Progress Notes (Signed)
START ON PATHWAY REGIMEN - Small Cell Lung     Cycles 1 through 4, every 21 days:     Atezolizumab      Carboplatin      Etoposide    Cycles 5 and beyond, every 21 days:     Atezolizumab   **Always confirm dose/schedule in your pharmacy ordering system**  Patient Characteristics: Newly Diagnosed, Preoperative or Nonsurgical Candidate (Clinical Staging), First Line, Extensive Stage Therapeutic Status: Newly Diagnosed, Preoperative or Nonsurgical Candidate (Clinical Staging) AJCC T Category: cT1c AJCC N Category: cN0 AJCC M Category: cM1b AJCC 8 Stage Grouping: IVA Stage Classification: Extensive  Intent of Therapy: Non-Curative / Palliative Intent, Discussed with Patient

## 2020-10-27 NOTE — Progress Notes (Signed)
[place] [phone]   Fax:([phone]) [place] NOTE  Patient Care Team: Allwardt, Randa Evens, PA-C as PCP - General (Physician Assistant) Milus Banister, MD as Attending Physician (Gastroenterology) Tanda Rockers, MD as Consulting Physician (Pulmonary Disease) Dr. Darylene Price, MD as Consulting Physician (Orthopedic Surgery) Latanya Maudlin, MD as Consulting Physician (Orthopedic Surgery) Kathrynn Ducking, MD as Consulting Physician (Neurology) Okey Regal, [place] as Consulting Physician (Optometry) [name], [employer] as Pharmacist (Pharmacist) Cameron Sprang, MD as Consulting Physician (Neurology)  Hematological/Oncological History # Small Cell Lung Cancer, Extensive Stage 07/05/2020: CT abdomen for lower abdominal pain. New pulmonary nodular density noted 07/06/2020: CT chest showed 2.2 cm macrolobulated right lower lobe pulmonary nodule (favored) versus pathologically enlarged infrahilar lymph node 10/13/2020: PET CT scan performed, findings show 2 cm right lower lobe lung mass is hypermetabolic and consistent with primary lung neoplasm. Additionally found hypermetabolic 17 mm soft tissue lesion between the descending duodenum and the pancreatic head  10/19/2020: EGD to biopsy hypermetabolic lymph node. Biopsy results show small cell lung cancer 10/26/2020: establish care with Dr. Lorenso Courier   CHIEF COMPLAINTS/PURPOSE OF CONSULTATION:  "Small Cell Lung Cancer "  HISTORY OF PRESENTING ILLNESS:  [name] 64 y.o. female with medical history significant for COPD, type 2 diabetes, anxiety, hypothyroidism, and hyperlipidemia who presents for evaluation of newly diagnosed small cell lung cancer.  On review of the previous records [name] initially underwent a CT scan of her abdomen on 07/05/2020 due to abdominal pain.  A new pulmonary nodular density was noted.  Subsequently she underwent a CT scan of the chest on 07/06/2020 which showed  2.2 cm right lower lobe pulmonary nodule.  On 10/14/2018 the patient underwent a PET CT scan which confirmed a 2 cm right lower lobe lung mass which was hypermetabolic and consistent with primary lung cancer.  Additionally there was noted to be a 17 millimeters soft tissue lesion in the descending duodenum.  Biopsy of this lesion was performed and found to be consistent with small cell lung cancer.  Due to concern for these findings the patient was referred to oncology for further evaluation and management.  On exam today [name] reports that she is an active smoker and smokes approximately 2 packs/day.  She has been doing this for 50 years.  She is a never drinker and previously worked in a [employer].  Family history is remarkable for some form of cancer in her maternal grandfather.  She notes that she has been having issues with back pain as well as a 20 pound weight loss over the last 6 months.  She reports her appetite has been poor.  She wanted to emphasize today that she is a DNR and does not wish to be resuscitated or intubated under any circumstances.  She currently denies any fevers, chills, sweats, nausea, vomiting or diarrhea.  A full 10 point ROS is listed below.  The bulk of our discussion focused on the diagnosis of small cell lung cancer and treatment options moving forward.  The details of this conversation are noted below.  MEDICAL HISTORY:  Past Medical History:  Diagnosis Date   Allergic rhinitis    Anemia    Anxiety    Chicken pox    Chronic back pain    COPD (chronic obstructive pulmonary disease) (HCC)    Depression    DM (diabetes mellitus) ([place])    Essential hypertension    GERD (gastroesophageal reflux disease)  Headache    migraines   History of gastritis    EGD 2015   History of home oxygen therapy    2 liters at hs last 6 months   Hyperlipidemia    Hypothyroidism    Migraines    Osteoarthritis    oa   Scoliosis     SURGICAL HISTORY: Past Surgical History:   Procedure Laterality Date   APPENDECTOMY     1985   BIOPSY  07/24/2017   Procedure: BIOPSY;  Surgeon: Milus Banister, MD;  Location: [facility];  Service: Endoscopy;;   CARDIAC CATHETERIZATION N/A 10/31/2015   Procedure: Left Heart Cath and Coronary Angiography;  Surgeon: Leonie Man, MD;  Location: [facility];  Service: Cardiovascular;  Laterality: N/A;   CARPAL TUNNEL RELEASE Left    CARPAL TUNNEL RELEASE Right    CHOLECYSTECTOMY  late 1980's   COLONOSCOPY WITH PROPOFOL N/A 07/24/2017   Procedure: COLONOSCOPY WITH PROPOFOL;  Surgeon: Milus Banister, MD;  [facility]: [facility];  Service: Endoscopy;  Laterality: N/A;   ESOPHAGOGASTRODUODENOSCOPY N/A 07/24/2017   Procedure: ESOPHAGOGASTRODUODENOSCOPY (EGD);  Surgeon: Milus Banister, MD;  Location: [facility];  Service: Endoscopy;  Laterality: N/A;   ESOPHAGOGASTRODUODENOSCOPY (EGD) WITH PROPOFOL N/A 10/19/2020   Procedure: ESOPHAGOGASTRODUODENOSCOPY (EGD) WITH PROPOFOL;  Surgeon: Milus Banister, MD;  [facility];  Service: Endoscopy;  Laterality: N/A;   FINE NEEDLE ASPIRATION N/A 10/19/2020   Procedure: FINE NEEDLE ASPIRATION (FNA) LINEAR;  Surgeon: Milus Banister, MD;  [facility]: [facility];  Service: Endoscopy;  Laterality: N/A;   GALLBLADDER SURGERY  1991   HIP CLOSED REDUCTION Right 01/08/2016   Procedure: CLOSED MANIPULATION HIP;  Surgeon: Susa Day, MD;  [facility]: [facility];  Service: Orthopedics;  Laterality: Right;   HIP CLOSED REDUCTION Right 01/19/2016   Procedure: ATTEMPTED CLOSED REDUCTION RIGHT HIP;  Surgeon: Wylene Simmer, MD;  Location: [facility];  Service: Orthopedics;  Laterality: Right;   HIP CLOSED REDUCTION Right 01/20/2016   Procedure: CLOSED REDUCTION RIGHT TOTAL HIP;  Surgeon: Paralee Cancel, MD;  Location: [facility];  Service: Orthopedics;  Laterality: Right;   HIP CLOSED REDUCTION Right 02/17/2016   Procedure: CLOSED REDUCTION RIGHT TOTAL HIP;  Surgeon: Rod Can, MD;  Location: [place];   Service: Orthopedics;  Laterality: Right;   HIP CLOSED REDUCTION Right 02/28/2016   Procedure: CLOSED REDUCTION HIP;  Surgeon: Nicholes Stairs, MD;  [facility];  Service: Orthopedics;  Laterality: Right;   POLYPECTOMY  07/24/2017   Procedure: POLYPECTOMY;  Surgeon: Milus Banister, MD;  [facility];  Service: Endoscopy;;   [place], with 1 ovary removed and 2 nd ovary removed 2003   TOTAL HIP ARTHROPLASTY Right    Original surgery 2006 with revision 2010   TOTAL HIP REVISION Right 01/01/2016   Procedure: TOTAL HIP REVISION;  Surgeon: Paralee Cancel, MD;  [facility]: [facility];  Service: Orthopedics;  Laterality: Right;   TOTAL HIP REVISION Right 03/02/2016   Procedure: TOTAL HIP REVISION;  Surgeon: Paralee Cancel, MD;  Location: [facility];  Service: Orthopedics;  Laterality: Right;   TOTAL HIP REVISION Right 09/02/2016   Procedure: Right hip constrained liner- posterior;  Surgeon: Paralee Cancel, MD;  Location[facility];  Service: Orthopedics;  Laterality: Right;   ULNAR NERVE TRANSPOSITION Right    UPPER ESOPHAGEAL ENDOSCOPIC ULTRASOUND (EUS) N/A 10/19/2020   Procedure: UPPER ESOPHAGEAL ENDOSCOPIC ULTRASOUND (EUS);  Surgeon: Milus Banister, MD;  [facility];  Service: Endoscopy;  Laterality: N/A;  periduodenal lesion    SOCIAL HISTORY: Social History   Socioeconomic History   Marital status: Married    Spouse name: Not on file   Number of children: 2   Years of education: Not on file   Highest education level: Not on file  Occupational History   Occupation: disabled   Occupation: disabled  Tobacco Use   Smoking status: Every Day    Packs/day: 1.50    Years: 46.00    Pack years: 69.00    Types: Cigarettes   Smokeless tobacco: Never   Tobacco comments:    tobacco info given  Vaping Use   Vaping Use: Never used  Substance and Sexual Activity   Alcohol use: No   Drug use: No   Sexual activity: Not Currently     Partners: Male  Other Topics Concern   Not on file  Social History Narrative   Right handed    Caffeine~ 2 cups per day    Lives at home with husband (strained relationship)   Primary caretaker for disabled brother who had aneurism   Daughter died 06-19-2018    Social Determinants of Health   Financial Resource Strain: Low Risk    Difficulty of Paying Living Expenses: Not hard at all  Food Insecurity: Not on file  Transportation Needs: No Transportation Needs   Lack of Transportation (Medical): No   Lack of Transportation (Non-Medical): No  Physical Activity: Inactive   Days of Exercise per Week: 0 days   Minutes of Exercise per Session: 0 min  Stress: Stress Concern Present   Feeling of Stress : To some extent  Social Connections: Moderately Integrated   Frequency of Communication with Friends and Family: More than three times a week   Frequency of Social Gatherings with Friends and Family: Once a week   Attends Religious Services: 1 to 4 times per year   Active Member of [employer] or Organizations: No   Attends Music therapist: Never   Marital Status: Married  Human resources officer Violence: Not At Risk   Fear of Current or Ex-Partner: No   Emotionally Abused: No   Physically Abused: No   Sexually Abused: No    FAMILY HISTORY: Family History  Problem Relation Age of Onset   COPD Mother    Heart disease Mother    Lung disease Father        Asbestosis   Heart attack Father    Heart disease Father    Cerebral aneurysm Brother    Aneurysm Brother        Brain   Drug abuse Daughter    Epilepsy Son    Alcohol abuse Son    Drug abuse Son    Arthritis Maternal Grandmother    Heart disease Maternal Grandmother    Asthma Maternal Grandfather    Cancer Maternal Grandfather    Arthritis Paternal Grandmother    Heart disease Paternal Grandmother    Stroke Paternal Grandmother    Early death Paternal Grandfather    Heart disease Paternal Grandfather      ALLERGIES:  is allergic to metformin and related, nsaids, aleve [naproxen sodium], codeine, penicillins, and sulfonamide derivatives.  MEDICATIONS:  Current Outpatient Medications  Medication Sig Dispense Refill   lidocaine-prilocaine (EMLA) cream Apply 1 application topically as needed. 30 g 0   ondansetron (ZOFRAN) 8 MG tablet Take 1 tablet (8 mg total) by mouth every 8 (eight) hours as needed.  30 tablet 0   prochlorperazine (COMPAZINE) 10 MG tablet Take 1 tablet (10 mg total) by mouth every 6 (six) hours as needed for nausea or vomiting. 30 tablet 0   albuterol (PROAIR HFA) 108 (90 Base) MCG/ACT inhaler 2 puffs every 4 hours as needed only  if your can't catch your breath 18 g 3   ALPRAZolam (XANAX) 1 MG tablet Take 1 tablet po BID as needed for anxiety. 60 tablet 2   aspirin EC 81 MG tablet Take 1 tablet (81 mg total) by mouth daily. Swallow whole. 90 tablet 3   Aspirin-Acetaminophen-Caffeine (GOODY HEADACHE PO) Take 1 packet by mouth 3 (three) times daily as needed (headaches.).     atorvastatin (LIPITOR) 20 MG tablet Take 1 tablet (20 mg total) by mouth at bedtime. 90 tablet 1   cyclobenzaprine (FLEXERIL) 10 MG tablet Take 1 tablet (10 mg total) by mouth 3 (three) times daily as needed for muscle spasms. 90 tablet 5   dicyclomine (BENTYL) 10 MG capsule Take 1 capsule (10 mg total) by mouth 2 (two) times daily as needed for spasms. 30 capsule 0   DULoxetine (CYMBALTA) 60 MG capsule Take 2 capsules (120 mg total) by mouth daily. (Patient taking differently: Take 120 mg by mouth at bedtime.) 60 capsule 5   fluticasone (FLONASE) 50 MCG/ACT nasal spray Place 2 sprays into both nostrils daily. 16 g 6   Fluticasone-Umeclidin-Vilant (TRELEGY ELLIPTA) 100-62.5-25 MCG/INH AEPB Inhale 1 puff into the lungs daily. 60 each 6   furosemide (LASIX) 20 MG tablet TAKE 1 TABLET BY MOUTH EVERY DAY AS NEEDED 30 tablet 90   glucose blood test strip Use as instructed 300 each 11   HYDROcodone-acetaminophen  (NORCO) 10-325 MG tablet Take 1 tablet by mouth every 8 (eight) hours as needed. 90 tablet 0   levothyroxine (SYNTHROID) 150 MCG tablet TAKE 1 TABLET BY MOUTH DAILY BEFORE breakfast 30 tablet 3   Lidocaine 4 % PTCH Place 1 patch onto the skin daily as needed (pain.).     loratadine (CLARITIN) 10 MG tablet Take 10 mg by mouth daily.     omeprazole (PRILOSEC) 40 MG capsule TAKE ONE CAPSULE BY MOUTH TWICE DAILY 60 capsule 2   OXYGEN Inhale 2 L into the lungs at bedtime. Uses At Night     potassium chloride (KLOR-CON) 10 MEQ tablet Take 1 tablet (10 mEq total) by mouth at bedtime. 90 tablet 1   triamterene-hydrochlorothiazide (MAXZIDE-25) 37.5-25 MG tablet Take 1 tablet by mouth daily. 90 tablet 1   No current facility-administered medications for this visit.    REVIEW OF SYSTEMS:   Constitutional: ( - ) fevers, ( - )  chills , ( - ) night sweats Eyes: ( - ) blurriness of vision, ( - ) double vision, ( - ) watery eyes Ears, nose, mouth, throat, and face: ( - ) mucositis, ( - ) sore throat Respiratory: ( - ) cough, ( - ) dyspnea, ( - ) wheezes Cardiovascular: ( - ) palpitation, ( - ) chest discomfort, ( - ) lower extremity swelling Gastrointestinal:  ( - ) nausea, ( - ) heartburn, ( - ) change in bowel habits Skin: ( - ) abnormal skin rashes Lymphatics: ( - ) new lymphadenopathy, ( - ) easy bruising Neurological: ( - ) numbness, ( - ) tingling, ( - ) new weaknesses Behavioral/Psych: ( - ) mood change, ( - ) new changes  All other systems were reviewed with the patient and are negative.  PHYSICAL EXAMINATION:  ECOG PERFORMANCE STATUS: 1 - Symptomatic but completely ambulatory  Vitals:   10/26/20 0900 10/26/20 0903  BP:  (!) 161/87  Pulse: 85 85  Resp: 18 18  Temp:  98 F (36.7 C)  SpO2: 97% 97%   Filed Weights   10/26/20 0900 10/26/20 0903  Weight: 210 lb 3 oz (95.3 kg) 210 lb 3 oz (95.3 kg)    GENERAL: well appearing middle-aged Caucasian female in NAD  SKIN: skin color, texture,  turgor are normal, no rashes or significant lesions EYES: conjunctiva are pink and non-injected, sclera clear LUNGS: clear to auscultation and percussion with normal breathing effort HEART: regular rate & rhythm and no murmurs and no lower extremity edema Musculoskeletal: no cyanosis of digits and no clubbing  PSYCH: alert & oriented x 3, fluent speech NEURO: no focal motor/sensory deficits  LABORATORY DATA:  I have reviewed the data as listed CBC Latest Ref Rng & Units 10/26/2020 05/25/2020 03/01/2020  WBC 4.0 - 10.5 K/uL 13.2(H) 10.1 9.9  Hemoglobin 12.0 - 15.0 g/dL 14.4 13.8 13.4  Hematocrit 36.0 - 46.0 % 45.3 41.8 40.4  Platelets 150 - 400 K/uL 272 203.0 190.0    CMP Latest Ref Rng & Units 10/26/2020 09/13/2020 06/28/2020  Glucose 70 - 99 mg/dL 90 65 78  BUN 8 - 23 mg/dL 10 8 12   Creatinine 0.44 - 1.00 mg/dL 1.01(H) 0.91 0.98  Sodium 135 - 145 mmol/L 133(L) 134 138  Potassium 3.5 - 5.1 mmol/L 4.5 4.9 4.0  Chloride 98 - 111 mmol/L 97(L) 93(L) 100  CO2 22 - 32 mmol/L 25 24 24   Calcium 8.9 - 10.3 mg/dL 9.2 8.6(L) 9.1  Total Protein 6.5 - 8.1 g/dL 7.5 - -  Total Bilirubin 0.3 - 1.2 mg/dL 0.5 - -  Alkaline Phos 38 - 126 U/L 94 - -  AST 15 - 41 U/L 25 - -  ALT 0 - 44 U/L 21 - -    PATHOLOGY:  CYTOLOGY - NON PAP  CASE: [id number]  PATIENT: [name]  Non-Gynecological Cytology Report   Clinical History: Rt lung mass, PET +  Specimen Submitted:  A. DUODENUM, OUTER WALL, FINE NEEDLE ASPIRATION:   FINAL MICROSCOPIC DIAGNOSIS:  - Malignant cells consistent with small cell undifferentiated carcinoma   SPECIMEN ADEQUACY:  Satisfactory for evaluation   IMMEDIATE EVALUATION:  MALIGNANT ([facility])   DIAGNOSTIC COMMENTS:  Immunohistochemistry is positive for CD56, synaptophysin, TTF-1 (focal),  and pancytokeratin. P40, CD20, and CD3 are negative. Dr. Saralyn Pilar has  reviewed the case.   GROSS:  Received is/are  Prepared:  1) 2 slides (1 for quick stain), 2) 2 slides (1 for quick   stain), and 15 ccs of pink Saline solution from needle rinses.  (DB:CM:cm)  Smears:  4  Concentration Method (Thin Prep):  1  Cell Block:  1  Additional Studies:  n/a   Final Diagnosis performed by Vicente Males, MD.   Electronically signed  10/23/2020   RADIOGRAPHIC STUDIES: I have personally reviewed the radiological images as listed and agreed with the findings in the report. NM PET Image Initial (PI) Skull Base To Thigh  Result Date: 10/15/2020 CLINICAL DATA:  Initial treatment strategy for right lower lobe pulmonary nodule. EXAM: NUCLEAR MEDICINE PET SKULL BASE TO THIGH TECHNIQUE: 11.6 mCi F-18 FDG was injected intravenously. Full-ring PET imaging was performed from the skull base to thigh after the radiotracer. CT data was obtained and used for attenuation correction and anatomic localization. Fasting blood glucose: 95 mg/dl COMPARISON:  Chest CT  07/26/2020 FINDINGS: Mediastinal blood pool activity: SUV max 2.01 Liver activity: SUV max NA NECK: No hypermetabolic lymph nodes in the neck. Incidental CT findings: none CHEST: The lobulated 2 cm right lower lobe pulmonary lesion is hypermetabolic with SUV max of 40.34 and consistent with primary lung neoplasm. No enlarged or hypermetabolic mediastinal or hilar lymph nodes. No other worrisome pulmonary nodules are identified. No breast masses, supraclavicular or axillary adenopathy. Incidental CT findings: Stable vascular calcifications. Stable early emphysematous changes. ABDOMEN/PELVIS: No findings to suggest metastatic disease involving liver or adrenal glands. There is a 17 mm soft tissue lesion between the descending duodenum and the pancreatic head. This is hypermetabolic with SUV max of 74.25 and is likely metastatic adenopathy. No other enlarged or hypermetabolic mesenteric or retroperitoneal lymph nodes. Incidental CT findings: Advanced atherosclerotic calcifications involving the aorta and iliac arteries but no aneurysm. The gallbladder is  surgically absent. Sigmoid colon diverticulosis. SKELETON: No findings for osseous metastatic disease. Incidental CT findings: none IMPRESSION: 1. 2 cm right lower lobe lung mass is hypermetabolic and consistent with primary lung neoplasm. 2. No mediastinal or hilar lymphadenopathy. 3. Hypermetabolic 17 mm soft tissue lesion between the descending duodenum and the pancreatic head is likely metastatic adenopathy. 4. No findings for pulmonary metastatic disease or osseous metastatic disease. Electronically Signed   By: Marijo Sanes M.D.   On: 10/15/2020 17:10   ECHOCARDIOGRAM COMPLETE  Result Date: 10/11/2020    ECHOCARDIOGRAM REPORT   Patient Name:   KAETLIN BULLEN Date of Exam: 10/11/2020 Medical Rec #:  [id number]       Height:       62.5 in Accession #:    [id number]      Weight:       215.4 lb Date of Birth:  01-16-1956        BSA:          1.985 m Patient Age:    63 years        BP:           140/88 mmHg Patient Gender: F               HR:           74 bpm. Exam Location:  [address] Procedure: 2D Echo, Cardiac Doppler and Color Doppler Indications:    R07.9 Chest pain  History:        Patient has no prior history of Echocardiogram examinations.                 COPD; Risk Factors:Hypertension, Sleep Apnea, Diabetes,                 Dyslipidemia, Current Smoker and Morbid obesity.  Sonographer:    Diamond Nickel RCS Referring Phys: Cassie Freer O'NEAL IMPRESSIONS  1. Left ventricular ejection fraction, by estimation, is 60 to 65%. The left ventricle has normal function. The left ventricle has no regional wall motion abnormalities. Left ventricular diastolic parameters were normal.  2. Right ventricular systolic function is normal. The right ventricular size is normal.  3. The mitral valve is normal in structure. Trivial mitral valve regurgitation.  4. The aortic valve is normal in structure. Aortic valve regurgitation is not visualized. No aortic stenosis is present. FINDINGS  Left Ventricle: Left  ventricular ejection fraction, by estimation, is 60 to 65%. The left ventricle has normal function. The left ventricle has no regional wall motion abnormalities. The left ventricular internal cavity size was normal in size. There is  no left ventricular hypertrophy. Left ventricular diastolic parameters were normal. Right Ventricle: The right ventricular size is normal. Right vetricular wall thickness was not well visualized. Right ventricular systolic function is normal. Left Atrium: Left atrial size was normal in size. Right Atrium: Right atrial size was normal in size. Pericardium: There is no evidence of pericardial effusion. Mitral Valve: The mitral valve is normal in structure. Trivial mitral valve regurgitation. Tricuspid Valve: The tricuspid valve is normal in structure. Tricuspid valve regurgitation is trivial. No evidence of tricuspid stenosis. Aortic Valve: The aortic valve is normal in structure. Aortic valve regurgitation is not visualized. No aortic stenosis is present. Pulmonic Valve: The pulmonic valve was normal in structure. Pulmonic valve regurgitation is not visualized. Aorta: The aortic root and ascending aorta are structurally normal, with no evidence of dilitation. IAS/Shunts: The atrial septum is grossly normal.  LEFT VENTRICLE PLAX 2D LVIDd:         5.20 cm  Diastology LVIDs:         3.50 cm  LV e' medial:    6.85 cm/s LV PW:         0.90 cm  LV E/e' medial:  11.9 LV IVS:        1.00 cm  LV e' lateral:   9.79 cm/s LVOT diam:     2.20 cm  LV E/e' lateral: 8.3 LV SV:         84 LV SV Index:   43 LVOT Area:     3.80 cm  RIGHT VENTRICLE RV Basal diam:  3.00 cm RV S prime:     14.70 cm/s TAPSE (M-mode): 2.1 cm LEFT ATRIUM             Index       RIGHT ATRIUM           Index LA diam:        3.60 cm 1.81 cm/m  RA Pressure: 3.00 mmHg LA Vol (A2C):   62.5 ml 31.49 ml/m RA Area:     14.50 cm LA Vol (A4C):   51.6 ml 26.00 ml/m RA Volume:   34.00 ml  17.13 ml/m LA Biplane Vol: 57.2 ml 28.82 ml/m   AORTIC VALVE LVOT Vmax:   105.00 cm/s LVOT Vmean:  65.200 cm/s LVOT VTI:    0.222 m  AORTA Ao Root diam: 3.00 cm Ao Asc diam:  3.30 cm MV E velocity: 81.20 cm/s   TRICUSPID VALVE MV A velocity: 102.00 cm/s  Estimated RAP:  3.00 mmHg MV E/A ratio:  0.80                             SHUNTS                             Systemic VTI:  0.22 m                             Systemic Diam: 2.20 cm Mertie Moores MD Electronically signed by Mertie Moores MD Signature Date/Time: 10/11/2020/4:57:15 PM    Final     ASSESSMENT & PLAN [name] 65 y.o. female with medical history significant for COPD, type 2 diabetes, anxiety, hypothyroidism, and hyperlipidemia who presents for evaluation of newly diagnosed small cell lung cancer.  After review of the labs, review of the records, and discussion with the patient the  patients findings are most consistent with extensive stage small cell lung cancer with metastasis from the right lower lobe to the lymph nodes of the abdomen.  At this time would recommend MRI of the brain ordered to complete staging.  Assuming there is no lesions in the brain we will pursue triple therapy with carboplatin, etoposide, and atezolizumab.  After 4 cycles we will convert to maintenance atezolizumab alone.  In the event that there were to be some brain lesions we would recommend pursuing radiation therapy to the brain first that she is otherwise asymptomatic from her disease.  The treatment of choice consist of carboplatin, etoposide, and atezolizumab.  The regimen consists of carboplatin AUC of 5 IV on day 1, etoposide 100 mg per metered squared IV on day 1, 2, and 3 and atezolizumab 1200 mg on day 1.  This continues for 21-day cycles.  After 4 cycles the patient proceeds with atezolizumab maintenance therapy alone.  #Small Cell Lung Cancer, Extensive Stage -- In order to complete staging we will need an MRI of the brain --Findings are currently consistent with metastatic small cell lung cancer  with metastatic spread to the lymph nodes of the abdomen --We will plan to pursue port placement in preparation for carboplatin, etoposide, and atezolizumab --Prognosis is poor with anticipated life span of less than 12 months --We will determine if radiation therapy is required based off the results of the MRI of the brain --Return to clinic to start chemotherapy.  Dissipate this will be in 2 to 3 weeks  #Supportive Care -- chemotherapy education to be scheduled  -- port placement to be scheduled.  -- zofran 8mg  q8H PRN and compazine 10mg  PO q6H for nausea -- EMLA cream for port -- no pain medication required at this time.    Orders Placed This Encounter  Procedures   MR Brain W Wo Contrast    Standing Status:   Future    Standing Expiration Date:   10/26/2021    Order Specific Question:   If indicated for the ordered procedure, I authorize the administration of contrast media per Radiology protocol    Answer:   Yes    Order Specific Question:   What is the patient's sedation requirement?    Answer:   No Sedation    Order Specific Question:   Does the patient have a pacemaker or implanted devices?    Answer:   No    Order Specific Question:   Use SRS Protocol?    Answer:   Yes    Order Specific Question:   Preferred imaging location?    Answer:   [facility] (table limit - 550 lbs)   IR IMAGING GUIDED PORT INSERTION    Standing Status:   Future    Standing Expiration Date:   10/27/2021    Order Specific Question:   Reason for Exam (SYMPTOM  OR DIAGNOSIS REQUIRED)    Answer:   needs port to start chemotherapy for lung cancer    Order Specific Question:   Preferred Imaging Location?    Answer:   [facility]   CBC with Differential ([place] Only)    Standing Status:   Future    Number of Occurrences:   1    Standing Expiration Date:   10/26/2021   CMP ([place] only)    Standing Status:   Future    Number of Occurrences:   1    Standing Expiration  Date:   10/26/2021  All questions were answered. The patient knows to call the clinic with any problems, questions or concerns.  A total of more than 60 minutes were spent on this encounter with face-to-face time and non-face-to-face time, including preparing to see the patient, ordering tests and/or medications, counseling the patient and coordination of care as outlined above.   Ledell Peoples, MD Department of Hematology/Oncology [place] at [facility] Phone: [phone] Pager: [phone] Email: [email or web address]  10/27/2020 1:42 PM

## 2020-10-29 DIAGNOSIS — J449 Chronic obstructive pulmonary disease, unspecified: Secondary | ICD-10-CM | POA: Diagnosis not present

## 2020-10-30 ENCOUNTER — Telehealth: Payer: Medicare HMO | Admitting: Pulmonary Disease

## 2020-10-30 ENCOUNTER — Telehealth: Payer: Self-pay | Admitting: Pulmonary Disease

## 2020-10-30 ENCOUNTER — Other Ambulatory Visit: Payer: Self-pay | Admitting: Physician Assistant

## 2020-10-30 NOTE — Telephone Encounter (Signed)
Spoke to patient and relayed below message.  She would like to do a video visit.  Appt type has been updated within epic.  Nothing further needed at this time.

## 2020-10-30 NOTE — Telephone Encounter (Signed)
Prefer in-person or video visit as we have had multiple discussions/coordination of care events in the last several weeks. Will need to optimize her respiratory status as well prior to chemo treatment

## 2020-10-30 NOTE — Telephone Encounter (Signed)
Spoke to patient, who is questioning if she should come in for her 2:00 visit today. She has already discussed results with Dr. Loanne Drilling and has seen oncology.   Dr. Loanne Drilling, please advise.

## 2020-10-31 ENCOUNTER — Other Ambulatory Visit: Payer: Self-pay

## 2020-10-31 ENCOUNTER — Encounter: Payer: Self-pay | Admitting: *Deleted

## 2020-10-31 ENCOUNTER — Other Ambulatory Visit: Payer: Self-pay | Admitting: Radiology

## 2020-10-31 ENCOUNTER — Other Ambulatory Visit: Payer: Self-pay | Admitting: Pharmacist

## 2020-10-31 DIAGNOSIS — G894 Chronic pain syndrome: Secondary | ICD-10-CM

## 2020-10-31 MED ORDER — HYDROCODONE-ACETAMINOPHEN 5-325 MG PO TABS: 1.0000 | ORAL_TABLET | Freq: Four times a day (QID) | ORAL | 0 refills | Status: DC | PRN

## 2020-10-31 NOTE — Progress Notes (Signed)
I followed up on [name]'s schedule. She is not set up for mri brain nor chemo. I contacted central scheduling and was given an appt.  I also reached out to [facility] scheduling for an update.

## 2020-10-31 NOTE — Progress Notes (Signed)
I spoke with pharmacy regarding treatment plan.  I was told treatment plan is up to date.  I then notified scheduling to schedule.  I called patient to update on chemo ed appt as well as mri brain.  She verbalized understanding of appts.

## 2020-11-01 ENCOUNTER — Other Ambulatory Visit: Payer: Self-pay

## 2020-11-01 ENCOUNTER — Ambulatory Visit (HOSPITAL_COMMUNITY)
Admission: RE | Admit: 2020-11-01 | Discharge: 2020-11-01 | Disposition: A | Discharge status: Home / Self Care | Payer: Medicare HMO | Source: Ambulatory Visit | Attending: Hematology and Oncology | Admitting: Hematology and Oncology

## 2020-11-01 ENCOUNTER — Encounter: Payer: Self-pay | Admitting: *Deleted

## 2020-11-01 ENCOUNTER — Encounter (HOSPITAL_COMMUNITY): Payer: Self-pay

## 2020-11-01 DIAGNOSIS — F17210 Nicotine dependence, cigarettes, uncomplicated: Secondary | ICD-10-CM | POA: Insufficient documentation

## 2020-11-01 DIAGNOSIS — Z880 Allergy status to penicillin: Secondary | ICD-10-CM | POA: Diagnosis not present

## 2020-11-01 DIAGNOSIS — C3490 Malignant neoplasm of unspecified part of unspecified bronchus or lung: Secondary | ICD-10-CM | POA: Diagnosis not present

## 2020-11-01 DIAGNOSIS — Z452 Encounter for adjustment and management of vascular access device: Secondary | ICD-10-CM | POA: Diagnosis not present

## 2020-11-01 DIAGNOSIS — C3491 Malignant neoplasm of unspecified part of right bronchus or lung: Secondary | ICD-10-CM | POA: Insufficient documentation

## 2020-11-01 DIAGNOSIS — Z885 Allergy status to narcotic agent status: Secondary | ICD-10-CM | POA: Insufficient documentation

## 2020-11-01 DIAGNOSIS — Z7982 Long term (current) use of aspirin: Secondary | ICD-10-CM | POA: Insufficient documentation

## 2020-11-01 DIAGNOSIS — Z79890 Hormone replacement therapy (postmenopausal): Secondary | ICD-10-CM | POA: Diagnosis not present

## 2020-11-01 DIAGNOSIS — Z79899 Other long term (current) drug therapy: Secondary | ICD-10-CM | POA: Insufficient documentation

## 2020-11-01 DIAGNOSIS — Z888 Allergy status to other drugs, medicaments and biological substances status: Secondary | ICD-10-CM | POA: Diagnosis not present

## 2020-11-01 DIAGNOSIS — Z882 Allergy status to sulfonamides status: Secondary | ICD-10-CM | POA: Diagnosis not present

## 2020-11-01 DIAGNOSIS — R69 Illness, unspecified: Secondary | ICD-10-CM | POA: Diagnosis not present

## 2020-11-01 DIAGNOSIS — Z886 Allergy status to analgesic agent status: Secondary | ICD-10-CM | POA: Insufficient documentation

## 2020-11-01 HISTORY — PX: IR IMAGING GUIDED PORT INSERTION: IMG5740

## 2020-11-01 LAB — CBC WITH DIFFERENTIAL/PLATELET
Abs Immature Granulocytes: 0.06 10*3/uL (ref 0.00–0.07)
Basophils Absolute: 0.1 10*3/uL (ref 0.0–0.1)
Basophils Relative: 0 %
Eosinophils Absolute: 0.1 10*3/uL (ref 0.0–0.5)
Eosinophils Relative: 1 %
HCT: 42.1 % (ref 36.0–46.0)
Hemoglobin: 13.4 g/dL (ref 12.0–15.0)
Immature Granulocytes: 1 %
Lymphocytes Relative: 27 %
Lymphs Abs: 3 10*3/uL (ref 0.7–4.0)
MCH: 26.1 pg (ref 26.0–34.0)
MCHC: 31.8 g/dL (ref 30.0–36.0)
MCV: 82.1 fL (ref 80.0–100.0)
Monocytes Absolute: 0.9 10*3/uL (ref 0.1–1.0)
Monocytes Relative: 8 %
Neutro Abs: 7.2 10*3/uL (ref 1.7–7.7)
Neutrophils Relative %: 63 %
Platelets: 237 10*3/uL (ref 150–400)
RBC: 5.13 MIL/uL — ABNORMAL HIGH (ref 3.87–5.11)
RDW: 17.4 % — ABNORMAL HIGH (ref 11.5–15.5)
WBC: 11.4 10*3/uL — ABNORMAL HIGH (ref 4.0–10.5)
nRBC: 0 % (ref 0.0–0.2)

## 2020-11-01 MED ORDER — MIDAZOLAM HCL 2 MG/2ML IJ SOLN
INTRAMUSCULAR | Status: AC
  Filled 2020-11-01: qty 2

## 2020-11-01 MED ORDER — HYDROCODONE-ACETAMINOPHEN 10-325 MG PO TABS: 1.0000 | ORAL_TABLET | Freq: Four times a day (QID) | ORAL | 0 refills | Status: DC | PRN

## 2020-11-01 MED ORDER — MIDAZOLAM HCL 2 MG/2ML IJ SOLN
INTRAMUSCULAR | Status: DC | PRN
  Administered 2020-11-01 (×2): 1 mg via INTRAVENOUS

## 2020-11-01 MED ORDER — FENTANYL CITRATE (PF) 100 MCG/2ML IJ SOLN
INTRAMUSCULAR | Status: AC
  Filled 2020-11-01: qty 2

## 2020-11-01 MED ORDER — FENTANYL CITRATE (PF) 100 MCG/2ML IJ SOLN
INTRAMUSCULAR | Status: DC | PRN
  Administered 2020-11-01 (×2): 50 ug via INTRAVENOUS

## 2020-11-01 MED ORDER — SODIUM CHLORIDE 0.9 % IV SOLN: INTRAVENOUS | Status: DC

## 2020-11-01 MED ORDER — LIDOCAINE-EPINEPHRINE (PF) 2 %-1:200000 IJ SOLN
INTRAMUSCULAR | Status: AC
  Administered 2020-11-01: 12 mL
  Filled 2020-11-01: qty 20

## 2020-11-01 MED ORDER — HEPARIN SOD (PORK) LOCK FLUSH 100 UNIT/ML IV SOLN
INTRAVENOUS | Status: AC
  Administered 2020-11-01: 500 [IU]
  Filled 2020-11-01: qty 5

## 2020-11-01 NOTE — Procedures (Signed)
Interventional Radiology Procedure Note  Procedure: Placement of a right IJ approach single lumen PowerPort.  Tip is positioned at the superior cavoatrial junction and catheter is ready for immediate use.  Complications: No immediate Recommendations:  - Ok to shower tomorrow - Do not submerge for 7 days - Routine line care   Signed,  Rheana Casebolt K. Makalynn Berwanger, MD   

## 2020-11-01 NOTE — Consult Note (Signed)
Chief Complaint: Patient was seen in consultation today for port a cath placement  Referring Physician(s): Dorsey,John T IV  Supervising Physician: Jacqulynn Cadet  Patient Status: [facility] - Out-pt  History of Present Illness: [name] is a 65 y.o. female smoker with history of newly diagnosed extensive stage small cell right lung cancer who presents today for Port-A-Cath placement to assist with treatment.  Additional medical history as listed below.  Past Medical History:  Diagnosis Date   Allergic rhinitis    Anemia    Anxiety    Chicken pox    Chronic back pain    COPD (chronic obstructive pulmonary disease) (HCC)    Depression    DM (diabetes mellitus) ([place])    Essential hypertension    GERD (gastroesophageal reflux disease)    Headache    migraines   History of gastritis    EGD 2015   History of home oxygen therapy    2 liters at hs last 6 months   Hyperlipidemia    Hypothyroidism    Migraines    Osteoarthritis    oa   Scoliosis     Past Surgical History:  Procedure Laterality Date   APPENDECTOMY     1985   BIOPSY  07/24/2017   Procedure: BIOPSY;  Surgeon: Milus Banister, MD;  Location: [facility];  Service: Endoscopy;;   CARDIAC CATHETERIZATION N/A 10/31/2015   Procedure: Left Heart Cath and Coronary Angiography;  Surgeon: Leonie Man, MD;  Location: [place] CV [facility];  Service: Cardiovascular;  Laterality: N/A;   CARPAL TUNNEL RELEASE Left    CARPAL TUNNEL RELEASE Right    CHOLECYSTECTOMY  late 1980's   COLONOSCOPY WITH PROPOFOL N/A 07/24/2017   Procedure: COLONOSCOPY WITH PROPOFOL;  Surgeon: Milus Banister, MD;  [facility];  Service: Endoscopy;  Laterality: N/A;   ESOPHAGOGASTRODUODENOSCOPY N/A 07/24/2017   Procedure: ESOPHAGOGASTRODUODENOSCOPY (EGD);  Surgeon: Milus Banister, MD;  Location: [facility];  Service: Endoscopy;  Laterality: N/A;   ESOPHAGOGASTRODUODENOSCOPY (EGD) WITH PROPOFOL N/A 10/19/2020   Procedure:  ESOPHAGOGASTRODUODENOSCOPY (EGD) WITH PROPOFOL;  Surgeon: Milus Banister, MD;  [facility];  Service: Endoscopy;  Laterality: N/A;   FINE NEEDLE ASPIRATION N/A 10/19/2020   Procedure: FINE NEEDLE ASPIRATION (FNA) LINEAR;  Surgeon: Milus Banister, MD;  [facility];  Service: Endoscopy;  Laterality: N/A;   GALLBLADDER SURGERY  1991   HIP CLOSED REDUCTION Right 01/08/2016   Procedure: CLOSED MANIPULATION HIP;  Surgeon: Susa Day, MD;  [facility];  Service: Orthopedics;  Laterality: Right;   HIP CLOSED REDUCTION Right 01/19/2016   Procedure: ATTEMPTED CLOSED REDUCTION RIGHT HIP;  Surgeon: Wylene Simmer, MD;  [facility];  Service: Orthopedics;  Laterality: Right;   HIP CLOSED REDUCTION Right 01/20/2016   Procedure: CLOSED REDUCTION RIGHT TOTAL HIP;  Surgeon: Paralee Cancel, MD;  [facility];  Service: Orthopedics;  Laterality: Right;   HIP CLOSED REDUCTION Right 02/17/2016   Procedure: CLOSED REDUCTION RIGHT TOTAL HIP;  Surgeon: Rod Can, MD;  Location: [facility];  Service: Orthopedics;  Laterality: Right;   HIP CLOSED REDUCTION Right 02/28/2016   Procedure: CLOSED REDUCTION HIP;  Surgeon: Nicholes Stairs, MD;  [facility];  Service: Orthopedics;  Laterality: Right;   POLYPECTOMY  07/24/2017   Procedure: POLYPECTOMY;  Surgeon: Milus Banister, MD;  [facility];  Service: Endoscopy;;   [place], with 1 ovary removed and  2 nd ovary removed 2003   TOTAL HIP ARTHROPLASTY Right    Original surgery 2006 with revision 2010   TOTAL HIP REVISION Right 01/01/2016   Procedure: TOTAL HIP REVISION;  Surgeon: Paralee Cancel, MD;  [facility];  Service: Orthopedics;  Laterality: Right;   TOTAL HIP REVISION Right 03/02/2016   Procedure: TOTAL HIP REVISION;  Surgeon: Paralee Cancel, MD;  [facility];  Service: Orthopedics;  Laterality: Right;   TOTAL HIP REVISION Right 09/02/2016   Procedure: Right hip  constrained liner- posterior;  Surgeon: Paralee Cancel, MD;  [facility];  Service: Orthopedics;  Laterality: Right;   ULNAR NERVE TRANSPOSITION Right    UPPER ESOPHAGEAL ENDOSCOPIC ULTRASOUND (EUS) N/A 10/19/2020   Procedure: UPPER ESOPHAGEAL ENDOSCOPIC ULTRASOUND (EUS);  Surgeon: Milus Banister, MD;  Location: [facility];  Service: Endoscopy;  Laterality: N/A;  periduodenal lesion    Allergies: Metformin and related, Nsaids, Aleve [naproxen sodium], Codeine, Penicillins, and Sulfonamide derivatives  Medications: Prior to Admission medications   Medication Sig Start Date End Date Taking? Authorizing Provider  albuterol (PROAIR HFA) 108 (90 Base) MCG/ACT inhaler 2 puffs every 4 hours as needed only  if your can't catch your breath 06/20/20  Yes Margaretha Seeds, MD  ALPRAZolam Duanne Moron) 1 MG tablet Take 1 tablet po BID as needed for anxiety. 09/11/20  Yes Allwardt, Randa Evens, PA-C  aspirin EC 81 MG tablet Take 1 tablet (81 mg total) by mouth daily. Swallow whole. 09/13/20  Yes O'Neal, Cassie Freer, MD  atorvastatin (LIPITOR) 20 MG tablet Take 1 tablet (20 mg total) by mouth at bedtime. 08/23/20  Yes Allwardt, Alyssa M, PA-C  cyclobenzaprine (FLEXERIL) 10 MG tablet Take 1 tablet (10 mg total) by mouth 3 (three) times daily as needed for muscle spasms. 05/29/20  Yes Lovorn, Jinny Blossom, MD  dicyclomine (BENTYL) 10 MG capsule Take 1 capsule (10 mg total) by mouth 2 (two) times daily as needed for spasms. 06/14/20  Yes Noralyn Pick, NP  DULoxetine (CYMBALTA) 60 MG capsule Take 2 capsules (120 mg total) by mouth daily. Patient taking differently: Take 120 mg by mouth at bedtime. 07/28/20  Yes Lovorn, Jinny Blossom, MD  Fluticasone-Umeclidin-Vilant (TRELEGY ELLIPTA) 100-62.5-25 MCG/INH AEPB Inhale 1 puff into the lungs daily. 06/20/20  Yes Margaretha Seeds, MD  furosemide (LASIX) 20 MG tablet TAKE 1 TABLET BY MOUTH EVERY DAY AS NEEDED 01/25/20  Yes Brunetta Jeans, PA-C  HYDROcodone-acetaminophen  (NORCO) 10-325 MG tablet Take 1 tablet by mouth every 6 (six) hours as needed. 11/01/20  Yes Lovorn, Jinny Blossom, MD  levothyroxine (SYNTHROID) 150 MCG tablet TAKE 1 TABLET BY MOUTH DAILY BEFORE breakfast 10/03/20  Yes Lovorn, Jinny Blossom, MD  loratadine (CLARITIN) 10 MG tablet Take 10 mg by mouth daily.   Yes [provider]  omeprazole (PRILOSEC) 40 MG capsule TAKE ONE CAPSULE BY MOUTH TWICE DAILY 09/03/20  Yes Noralyn Pick, NP  potassium chloride (KLOR-CON) 10 MEQ tablet Take 1 tablet (10 mEq total) by mouth at bedtime. 08/21/20  Yes Allwardt, Alyssa M, PA-C  Aspirin-Acetaminophen-Caffeine (GOODY HEADACHE PO) Take 1 packet by mouth 3 (three) times daily as needed (headaches.).    [provider]  fluticasone (FLONASE) 50 MCG/ACT nasal spray Place 2 sprays into both nostrils daily. 07/27/20   Allwardt, Randa Evens, PA-C  glucose blood test strip Use as instructed 05/19/20   Inda Coke, PA  HYDROcodone-acetaminophen (NORCO) 5-325 MG tablet Take 1 tablet by mouth every 6 (six) hours as needed for moderate pain. 10/31/20  Lovorn, Megan, MD  Lidocaine 4 % PTCH Place 1 patch onto the skin daily as needed (pain.).    [provider]  lidocaine-prilocaine (EMLA) cream Apply 1 application topically as needed. 10/27/20   Orson Slick, MD  ondansetron (ZOFRAN) 8 MG tablet Take 1 tablet (8 mg total) by mouth every 8 (eight) hours as needed. 10/27/20   Orson Slick, MD  OXYGEN Inhale 2 L into the lungs at bedtime. Uses At Night    [provider]  prochlorperazine (COMPAZINE) 10 MG tablet Take 1 tablet (10 mg total) by mouth every 6 (six) hours as needed for nausea or vomiting. 10/27/20   Orson Slick, MD  triamterene-hydrochlorothiazide (MAXZIDE-25) 37.5-25 MG tablet Take 1 tablet by mouth daily. 05/24/20   Allwardt, Randa Evens, PA-C     Family History  Problem Relation Age of Onset   COPD Mother    Heart disease Mother    Lung disease Father        Asbestosis    Heart attack Father    Heart disease Father    Cerebral aneurysm Brother    Aneurysm Brother        Brain   Drug abuse Daughter    Epilepsy Son    Alcohol abuse Son    Drug abuse Son    Arthritis Maternal Grandmother    Heart disease Maternal Grandmother    Asthma Maternal Grandfather    Cancer Maternal Grandfather    Arthritis Paternal Grandmother    Heart disease Paternal Grandmother    Stroke Paternal Grandmother    Early death Paternal Grandfather    Heart disease Paternal Grandfather     Social History   Socioeconomic History   Marital status: Married    Spouse name: Not on file   Number of children: 2   Years of education: Not on file   Highest education level: Not on file  Occupational History   Occupation: disabled   Occupation: disabled  Tobacco Use   Smoking status: Every Day    Packs/day: 1.50    Years: 46.00    Pack years: 69.00    Types: Cigarettes   Smokeless tobacco: Never   Tobacco comments:    tobacco info given  Vaping Use   Vaping Use: Never used  Substance and Sexual Activity   Alcohol use: No   Drug use: No   Sexual activity: Not Currently    Partners: Male  Other Topics Concern   Not on file  Social History Narrative   Right handed    Caffeine~ 2 cups per day    Lives at home with husband (strained relationship)   Primary caretaker for disabled brother who had aneurism   Daughter died 2018/06/09    Social Determinants of Health   Financial Resource Strain: Low Risk    Difficulty of Paying Living Expenses: Not hard at all  Food Insecurity: Not on file  Transportation Needs: No Transportation Needs   Lack of Transportation (Medical): No   Lack of Transportation (Non-Medical): No  Physical Activity: Inactive   Days of Exercise per Week: 0 days   Minutes of Exercise per Session: 0 min  Stress: Stress Concern Present   Feeling of Stress : To some extent  Social Connections: Moderately Integrated   Frequency of Communication with  Friends and Family: More than three times a week   Frequency of Social Gatherings with Friends and Family: Once a week   Attends Religious  Services: 1 to 4 times per year   Active Member of Clubs or Organizations: No   Attends Archivist Meetings: Never   Marital Status: Married      Review of Systems denies fever, chest pain, abdominal pain, nausea, vomiting or bleeding.  He does have occasional headaches, dyspnea with exertion, occasional cough, and back pain  Vital Signs: BP (!) 157/83 (BP Location: Right Arm)   Pulse 81   Temp 98.3 F (36.8 C) (Oral)   Resp 18   Wt 210 lb 3.3 oz (95.4 kg)   SpO2 93%   BMI 38.45 kg/m   Physical Exam awake, alert.  Chest with distant breath sounds bilaterally.  Heart with regular rate and rhythm.  Abdomen soft, positive bowel sounds, nontender.  No lower extremity edema  Imaging: NM PET Image Initial (PI) Skull Base To Thigh  Result Date: 10/15/2020 CLINICAL DATA:  Initial treatment strategy for right lower lobe pulmonary nodule. EXAM: NUCLEAR MEDICINE PET SKULL BASE TO THIGH TECHNIQUE: 11.6 mCi F-18 FDG was injected intravenously. Full-ring PET imaging was performed from the skull base to thigh after the radiotracer. CT data was obtained and used for attenuation correction and anatomic localization. Fasting blood glucose: 95 mg/dl COMPARISON:  Chest CT 07/26/2020 FINDINGS: Mediastinal blood pool activity: SUV max 2.01 Liver activity: SUV max NA NECK: No hypermetabolic lymph nodes in the neck. Incidental CT findings: none CHEST: The lobulated 2 cm right lower lobe pulmonary lesion is hypermetabolic with SUV max of 35.36 and consistent with primary lung neoplasm. No enlarged or hypermetabolic mediastinal or hilar lymph nodes. No other worrisome pulmonary nodules are identified. No breast masses, supraclavicular or axillary adenopathy. Incidental CT findings: Stable vascular calcifications. Stable early emphysematous changes. ABDOMEN/PELVIS: No  findings to suggest metastatic disease involving liver or adrenal glands. There is a 17 mm soft tissue lesion between the descending duodenum and the pancreatic head. This is hypermetabolic with SUV max of 14.43 and is likely metastatic adenopathy. No other enlarged or hypermetabolic mesenteric or retroperitoneal lymph nodes. Incidental CT findings: Advanced atherosclerotic calcifications involving the aorta and iliac arteries but no aneurysm. The gallbladder is surgically absent. Sigmoid colon diverticulosis. SKELETON: No findings for osseous metastatic disease. Incidental CT findings: none IMPRESSION: 1. 2 cm right lower lobe lung mass is hypermetabolic and consistent with primary lung neoplasm. 2. No mediastinal or hilar lymphadenopathy. 3. Hypermetabolic 17 mm soft tissue lesion between the descending duodenum and the pancreatic head is likely metastatic adenopathy. 4. No findings for pulmonary metastatic disease or osseous metastatic disease. Electronically Signed   By: Marijo Sanes M.D.   On: 10/15/2020 17:10   ECHOCARDIOGRAM COMPLETE  Result Date: 10/11/2020    ECHOCARDIOGRAM REPORT   Patient Name:   [name] Date of Exam: 10/11/2020 Medical Rec #:  [id number]       Height:       62.5 in Accession #:    [id number]      Weight:       215.4 lb Date of Birth:  Jan 02, 1956        BSA:          1.985 m Patient Age:    69 years        BP:           140/88 mmHg Patient Gender: F               HR:           74 bpm. Exam Location:  [address] Procedure: 2D Echo, Cardiac Doppler and Color Doppler Indications:    R07.9 Chest pain  History:        Patient has no prior history of Echocardiogram examinations.                 COPD; Risk Factors:Hypertension, Sleep Apnea, Diabetes,                 Dyslipidemia, Current Smoker and Morbid obesity.  Sonographer:    Diamond Nickel RCS Referring Phys: Cassie Freer O'NEAL IMPRESSIONS  1. Left ventricular ejection fraction, by estimation, is 60 to 65%. The left  ventricle has normal function. The left ventricle has no regional wall motion abnormalities. Left ventricular diastolic parameters were normal.  2. Right ventricular systolic function is normal. The right ventricular size is normal.  3. The mitral valve is normal in structure. Trivial mitral valve regurgitation.  4. The aortic valve is normal in structure. Aortic valve regurgitation is not visualized. No aortic stenosis is present. FINDINGS  Left Ventricle: Left ventricular ejection fraction, by estimation, is 60 to 65%. The left ventricle has normal function. The left ventricle has no regional wall motion abnormalities. The left ventricular internal cavity size was normal in size. There is  no left ventricular hypertrophy. Left ventricular diastolic parameters were normal. Right Ventricle: The right ventricular size is normal. Right vetricular wall thickness was not well visualized. Right ventricular systolic function is normal. Left Atrium: Left atrial size was normal in size. Right Atrium: Right atrial size was normal in size. Pericardium: There is no evidence of pericardial effusion. Mitral Valve: The mitral valve is normal in structure. Trivial mitral valve regurgitation. Tricuspid Valve: The tricuspid valve is normal in structure. Tricuspid valve regurgitation is trivial. No evidence of tricuspid stenosis. Aortic Valve: The aortic valve is normal in structure. Aortic valve regurgitation is not visualized. No aortic stenosis is present. Pulmonic Valve: The pulmonic valve was normal in structure. Pulmonic valve regurgitation is not visualized. Aorta: The aortic root and ascending aorta are structurally normal, with no evidence of dilitation. IAS/Shunts: The atrial septum is grossly normal.  LEFT VENTRICLE PLAX 2D LVIDd:         5.20 cm  Diastology LVIDs:         3.50 cm  LV e' medial:    6.85 cm/s LV PW:         0.90 cm  LV E/e' medial:  11.9 LV IVS:        1.00 cm  LV e' lateral:   9.79 cm/s LVOT diam:     2.20  cm  LV E/e' lateral: 8.3 LV SV:         84 LV SV Index:   43 LVOT Area:     3.80 cm  RIGHT VENTRICLE RV Basal diam:  3.00 cm RV S prime:     14.70 cm/s TAPSE (M-mode): 2.1 cm LEFT ATRIUM             Index       RIGHT ATRIUM           Index LA diam:        3.60 cm 1.81 cm/m  RA Pressure: 3.00 mmHg LA Vol (A2C):   62.5 ml 31.49 ml/m RA Area:     14.50 cm LA Vol (A4C):   51.6 ml 26.00 ml/m RA Volume:   34.00 ml  17.13 ml/m LA Biplane Vol: 57.2 ml 28.82 ml/m  AORTIC VALVE LVOT Vmax:   105.00  cm/s LVOT Vmean:  65.200 cm/s LVOT VTI:    0.222 m  AORTA Ao Root diam: 3.00 cm Ao Asc diam:  3.30 cm MV E velocity: 81.20 cm/s   TRICUSPID VALVE MV A velocity: 102.00 cm/s  Estimated RAP:  3.00 mmHg MV E/A ratio:  0.80                             SHUNTS                             Systemic VTI:  0.22 m                             Systemic Diam: 2.20 cm Mertie Moores MD Electronically signed by Mertie Moores MD Signature Date/Time: 10/11/2020/4:57:15 PM    Final     Labs:  CBC: Recent Labs    03/01/20 1340 05/25/20 1230 10/26/20 1032 11/01/20 1200  WBC 9.9 10.1 13.2* 11.4*  HGB 13.4 13.8 14.4 13.4  HCT 40.4 41.8 45.3 42.1  PLT 190.0 203.0 272 237    COAGS: No results for input(s): INR, APTT in the last 8760 hours.  BMP: Recent Labs    05/25/20 1230 06/28/20 1619 09/13/20 1416 10/26/20 1032  NA 136 138 134 133*  K 3.8 4.0 4.9 4.5  CL 99 100 93* 97*  CO2 31 24 24 25   GLUCOSE 93 78 65 90  BUN 11 12 8 10   CALCIUM 8.9 9.1 8.6* 9.2  CREATININE 0.97 0.98 0.91 1.01*  GFRNONAA  --   --   --  >60    LIVER FUNCTION TESTS: Recent Labs    11/30/19 1609 05/03/20 1545 05/25/20 1230 10/26/20 1032  BILITOT 0.5 0.5 0.5 0.5  AST 23 27 22 25   ALT 21 28 19 21   ALKPHOS 92 102 100 94  PROT 6.9 7.0 7.3 7.5  ALBUMIN 3.9 3.8 4.0 3.7    TUMOR MARKERS: No results for input(s): AFPTM, CEA, CA199, CHROMGRNA in the last 8760 hours.  Assessment and Plan: 65 y.o. female smoker with history of newly  diagnosed extensive stage small cell right lung cancer who presents today for Port-A-Cath placement to assist with treatment. Risks and benefits of image guided port-a-catheter placement was discussed with the patient including, but not limited to bleeding, infection, pneumothorax, or fibrin sheath development and need for additional procedures.  All of the patient's questions were answered, patient is agreeable to proceed. Consent signed and in chart.    Thank you for this interesting consult.  I greatly enjoyed meeting [name] and look forward to participating in their care.  A copy of this report was sent to the requesting provider on this date.  Electronically Signed: D. Rowe Robert, PA-C 11/01/2020, 1:00 PM   I spent a total of  25 minutes   in face to face in clinical consultation, greater than 50% of which was counseling/coordinating care for Port-A-Cath placement

## 2020-11-01 NOTE — Discharge Instructions (Signed)
Please call Interventional Radiology clinic [phone] with any questions or concerns.  You may remove your dressing and shower tomorrow.  DO NOT use EMLA cream on your port site for 2 weeks as this cream will remove surgical glue on your incision.  Implanted Port Insertion, Care After This sheet gives you information about how to care for yourself after your procedure. Your health care provider may also give you more specific instructions. If you have problems or questions, contact your health care provider. What can I expect after the procedure? After the procedure, it is common to have: Discomfort at the port insertion site. Bruising on the skin over the port. This should improve over 3-4 days. Follow these instructions at home: [facility] care After your port is placed, you will get a manufacturer's information card. The card has information about your port. Keep this card with you at all times. Take care of the port as told by your health care provider. Ask your health care provider if you or a family member can get training for taking care of the port at home. A home health care nurse may also take care of the port. Make sure to remember what type of port you have. Incision care Follow instructions from your health care provider about how to take care of your port insertion site. Make sure you: Wash your hands with soap and water before and after you change your bandage (dressing). If soap and water are not available, use hand sanitizer. Change your dressing as told by your health care provider. Leave stitches (sutures), skin glue, or adhesive strips in place. These skin closures may need to stay in place for 2 weeks or longer. If adhesive strip edges start to loosen and curl up, you may trim the loose edges. Do not remove adhesive strips completely unless your health care provider tells you to do that. Check your port insertion site every day for signs of infection. Check for: Redness,  swelling, or pain. Fluid or blood. Warmth. Pus or a bad smell.        Activity Return to your normal activities as told by your health care provider. Ask your health care provider what activities are safe for you. Do not lift anything that is heavier than 10 lb (4.5 kg), or the limit that you are told, until your health care provider says that it is safe. General instructions Take over-the-counter and prescription medicines only as told by your health care provider. Do not take baths, swim, or use a hot tub until your health care provider approves. Ask your health care provider if you may take showers. You may only be allowed to take sponge baths. Do not drive for 24 hours if you were given a sedative during your procedure. Wear a medical alert bracelet in case of an emergency. This will tell any health care providers that you have a port. Keep all follow-up visits as told by your health care provider. This is important. Contact a health care provider if: You cannot flush your port with saline as directed, or you cannot draw blood from the port. You have a fever or chills. You have redness, swelling, or pain around your port insertion site. You have fluid or blood coming from your port insertion site. Your port insertion site feels warm to the touch. You have pus or a bad smell coming from the port insertion site. Get help right away if: You have chest pain or shortness of breath. You have bleeding from  your port that you cannot control. Summary Take care of the port as told by your health care provider. Keep the manufacturer's information card with you at all times. Change your dressing as told by your health care provider. Contact a health care provider if you have a fever or chills or if you have redness, swelling, or pain around your port insertion site. Keep all follow-up visits as told by your health care provider. This information is not intended to replace advice given to you  by your health care provider. Make sure you discuss any questions you have with your health care provider. Document Revised: 08/12/2017 Document Reviewed: 08/12/2017 Elsevier Patient Education  2021 [place].   Moderate Conscious Sedation, Adult, Care After This sheet gives you information about how to care for yourself after your procedure. Your health care provider may also give you more specific instructions. If you have problems or questions, contact your health care provider. What can I expect after the procedure? After the procedure, it is common to have: Sleepiness for several hours. Impaired judgment for several hours. Difficulty with balance. Vomiting if you eat too soon. Follow these instructions at home: For the time period you were told by your health care provider: Rest. Do not participate in activities where you could fall or become injured. Do not drive or use machinery. Do not drink alcohol. Do not take sleeping pills or medicines that cause drowsiness. Do not make important decisions or sign legal documents. Do not take care of children on your own.        Eating and drinking Follow the diet recommended by your health care provider. Drink enough fluid to keep your urine pale yellow. If you vomit: Drink water, juice, or soup when you can drink without vomiting. Make sure you have little or no nausea before eating solid foods.    General instructions Take over-the-counter and prescription medicines only as told by your health care provider. Have a responsible adult stay with you for the time you are told. It is important to have someone help care for you until you are awake and alert. Do not smoke. Keep all follow-up visits as told by your health care provider. This is important. Contact a health care provider if: You are still sleepy or having trouble with balance after 24 hours. You feel light-headed. You keep feeling nauseous or you keep vomiting. You  develop a rash. You have a fever. You have redness or swelling around the IV site. Get help right away if: You have trouble breathing. You have new-onset confusion at home. Summary After the procedure, it is common to feel sleepy, have impaired judgment, or feel nauseous if you eat too soon. Rest after you get home. Know the things you should not do after the procedure. Follow the diet recommended by your health care provider and drink enough fluid to keep your urine pale yellow. Get help right away if you have trouble breathing or new-onset confusion at home. This information is not intended to replace advice given to you by your health care provider. Make sure you discuss any questions you have with your health care provider. Document Revised: 05/14/2019 Document Reviewed: 12/10/2018 Elsevier Patient Education  2021 [employer].

## 2020-11-01 NOTE — Progress Notes (Signed)
I followed up on [name]'s schedule. Her schedule shows she is set up for her plan of care at this time.

## 2020-11-03 ENCOUNTER — Other Ambulatory Visit: Payer: Medicare HMO

## 2020-11-04 ENCOUNTER — Ambulatory Visit (HOSPITAL_COMMUNITY)
Admission: RE | Admit: 2020-11-04 | Discharge: 2020-11-04 | Disposition: A | Discharge status: Home / Self Care | Payer: Medicare HMO | Source: Ambulatory Visit | Attending: Hematology and Oncology | Admitting: Hematology and Oncology

## 2020-11-04 ENCOUNTER — Other Ambulatory Visit: Payer: Self-pay

## 2020-11-04 DIAGNOSIS — C3490 Malignant neoplasm of unspecified part of unspecified bronchus or lung: Secondary | ICD-10-CM | POA: Diagnosis not present

## 2020-11-04 MED ORDER — GADOBUTROL 1 MMOL/ML IV SOLN
9.0000 mL | Freq: Once | INTRAVENOUS | Status: AC | PRN
  Administered 2020-11-04: 9 mL via INTRAVENOUS

## 2020-11-06 NOTE — Progress Notes (Signed)
Pharmacist Chemotherapy Monitoring - Initial Assessment    Anticipated start date: 11/13/20   The following has been reviewed per standard work regarding the patient's treatment regimen: The patient's diagnosis, treatment plan and drug doses, and organ/hematologic function Lab orders and baseline tests specific to treatment regimen  The treatment plan start date, drug sequencing, and pre-medications Prior authorization status  Patient's documented medication list, including drug-drug interaction screen and prescriptions for anti-emetics and supportive care specific to the treatment regimen The drug concentrations, fluid compatibility, administration routes, and timing of the medications to be used The patient's access for treatment and lifetime cumulative dose history, if applicable  The patient's medication allergies and previous infusion related reactions, if applicable   Changes made to treatment plan:  N/A  Follow up needed:  N/A and Pending authorization for treatment    Larene Beach, [place], 11/06/2020  2:53 PM

## 2020-11-07 ENCOUNTER — Other Ambulatory Visit: Payer: Self-pay

## 2020-11-07 ENCOUNTER — Inpatient Hospital Stay: Payer: Medicare HMO | Attending: Hematology and Oncology

## 2020-11-07 DIAGNOSIS — M1990 Unspecified osteoarthritis, unspecified site: Secondary | ICD-10-CM | POA: Insufficient documentation

## 2020-11-07 DIAGNOSIS — Z820 Family history of epilepsy and other diseases of the nervous system: Secondary | ICD-10-CM | POA: Insufficient documentation

## 2020-11-07 DIAGNOSIS — Z811 Family history of alcohol abuse and dependence: Secondary | ICD-10-CM | POA: Insufficient documentation

## 2020-11-07 DIAGNOSIS — Z79899 Other long term (current) drug therapy: Secondary | ICD-10-CM | POA: Insufficient documentation

## 2020-11-07 DIAGNOSIS — Z8261 Family history of arthritis: Secondary | ICD-10-CM | POA: Insufficient documentation

## 2020-11-07 DIAGNOSIS — Z5111 Encounter for antineoplastic chemotherapy: Secondary | ICD-10-CM | POA: Insufficient documentation

## 2020-11-07 DIAGNOSIS — Z814 Family history of other substance abuse and dependence: Secondary | ICD-10-CM | POA: Insufficient documentation

## 2020-11-07 DIAGNOSIS — E039 Hypothyroidism, unspecified: Secondary | ICD-10-CM | POA: Insufficient documentation

## 2020-11-07 DIAGNOSIS — C3431 Malignant neoplasm of lower lobe, right bronchus or lung: Secondary | ICD-10-CM | POA: Insufficient documentation

## 2020-11-07 DIAGNOSIS — Z5189 Encounter for other specified aftercare: Secondary | ICD-10-CM | POA: Insufficient documentation

## 2020-11-07 DIAGNOSIS — E119 Type 2 diabetes mellitus without complications: Secondary | ICD-10-CM | POA: Insufficient documentation

## 2020-11-07 DIAGNOSIS — E785 Hyperlipidemia, unspecified: Secondary | ICD-10-CM | POA: Insufficient documentation

## 2020-11-07 DIAGNOSIS — F17210 Nicotine dependence, cigarettes, uncomplicated: Secondary | ICD-10-CM | POA: Insufficient documentation

## 2020-11-07 DIAGNOSIS — Z836 Family history of other diseases of the respiratory system: Secondary | ICD-10-CM | POA: Insufficient documentation

## 2020-11-07 DIAGNOSIS — Z23 Encounter for immunization: Secondary | ICD-10-CM | POA: Insufficient documentation

## 2020-11-07 DIAGNOSIS — Z823 Family history of stroke: Secondary | ICD-10-CM | POA: Insufficient documentation

## 2020-11-07 DIAGNOSIS — Z8249 Family history of ischemic heart disease and other diseases of the circulatory system: Secondary | ICD-10-CM | POA: Insufficient documentation

## 2020-11-07 DIAGNOSIS — Z5112 Encounter for antineoplastic immunotherapy: Secondary | ICD-10-CM | POA: Insufficient documentation

## 2020-11-07 DIAGNOSIS — Z801 Family history of malignant neoplasm of trachea, bronchus and lung: Secondary | ICD-10-CM | POA: Insufficient documentation

## 2020-11-08 ENCOUNTER — Encounter: Payer: Self-pay | Admitting: *Deleted

## 2020-11-08 NOTE — Progress Notes (Signed)
I followed up on Ms. Rosselli's MRI Brain and it has not been read yet. I called radiology to have them look at this as soon as possible.  I updated Dr. Lorenso Courier.

## 2020-11-09 ENCOUNTER — Telehealth: Payer: Self-pay | Admitting: *Deleted

## 2020-11-09 ENCOUNTER — Telehealth: Payer: Self-pay | Admitting: Pharmacist

## 2020-11-09 NOTE — Telephone Encounter (Signed)
TCT patient regarding results of her recent brain scan.  Spoke with her and advised that her scan was normal-no signs of cancer spread to her brain.  She was very pleased with this result.  She is aware of her 1st treatment coming up on 11/13/20. She has been to her chemo education class and found it to be very helpful, though a lot of information. Encouraged her to call whenever she has a question.  She states she will.

## 2020-11-09 NOTE — Telephone Encounter (Signed)
-----   Message from Orson Slick, MD sent at 11/09/2020  9:46 AM EDT ----- Please let [name] know that her MRI brain showed no evidence the small cell cancer spread to the brain. We will plan to start treatment as scheduled on 11/13/2020.  ----- Message ----- From: Interface, Rad Results In Sent: 11/08/2020   3:42 PM EDT To: Orson Slick, MD

## 2020-11-09 NOTE — Chronic Care Management (AMB) (Signed)
Chronic Care Management Pharmacy Assistant   Name: [name]  MRN: [id number] DOB: May 17, 1955   Reason for Encounter: Diabetes Adherence Call    Recent office visits:  None  Recent consult visits:  10/26/2020 OV (oncology) Orson Slick, MD; small cell lung cancer, We will determine if radiation therapy is required based off the results of the MRI of the brain  Hospital visits:  None in previous 6 months  Medications: Outpatient Encounter Medications as of 11/09/2020  Medication Sig   albuterol (PROAIR HFA) 108 (90 Base) MCG/ACT inhaler 2 puffs every 4 hours as needed only  if your can't catch your breath   ALPRAZolam (XANAX) 1 MG tablet Take 1 tablet po BID as needed for anxiety.   aspirin EC 81 MG tablet Take 1 tablet (81 mg total) by mouth daily. Swallow whole.   Aspirin-Acetaminophen-Caffeine (GOODY HEADACHE PO) Take 1 packet by mouth 3 (three) times daily as needed (headaches.).   atorvastatin (LIPITOR) 20 MG tablet Take 1 tablet (20 mg total) by mouth at bedtime.   cyclobenzaprine (FLEXERIL) 10 MG tablet Take 1 tablet (10 mg total) by mouth 3 (three) times daily as needed for muscle spasms.   dicyclomine (BENTYL) 10 MG capsule Take 1 capsule (10 mg total) by mouth 2 (two) times daily as needed for spasms.   DULoxetine (CYMBALTA) 60 MG capsule Take 2 capsules (120 mg total) by mouth daily. (Patient taking differently: Take 120 mg by mouth at bedtime.)   fluticasone (FLONASE) 50 MCG/ACT nasal spray Place 2 sprays into both nostrils daily.   Fluticasone-Umeclidin-Vilant (TRELEGY ELLIPTA) 100-62.5-25 MCG/INH AEPB Inhale 1 puff into the lungs daily.   furosemide (LASIX) 20 MG tablet TAKE 1 TABLET BY MOUTH EVERY DAY AS NEEDED   glucose blood test strip Use as instructed   HYDROcodone-acetaminophen (NORCO) 10-325 MG tablet Take 1 tablet by mouth every 6 (six) hours as needed.   HYDROcodone-acetaminophen (NORCO) 5-325 MG tablet Take 1 tablet by mouth every 6 (six) hours  as needed for moderate pain.   levothyroxine (SYNTHROID) 150 MCG tablet TAKE 1 TABLET BY MOUTH DAILY BEFORE breakfast   Lidocaine 4 % PTCH Place 1 patch onto the skin daily as needed (pain.).   lidocaine-prilocaine (EMLA) cream Apply 1 application topically as needed.   loratadine (CLARITIN) 10 MG tablet Take 10 mg by mouth daily.   omeprazole (PRILOSEC) 40 MG capsule TAKE ONE CAPSULE BY MOUTH TWICE DAILY   ondansetron (ZOFRAN) 8 MG tablet Take 1 tablet (8 mg total) by mouth every 8 (eight) hours as needed.   OXYGEN Inhale 2 L into the lungs at bedtime. Uses At Night   potassium chloride (KLOR-CON) 10 MEQ tablet Take 1 tablet (10 mEq total) by mouth at bedtime.   prochlorperazine (COMPAZINE) 10 MG tablet Take 1 tablet (10 mg total) by mouth every 6 (six) hours as needed for nausea or vomiting.   triamterene-hydrochlorothiazide (MAXZIDE-25) 37.5-25 MG tablet Take 1 tablet by mouth daily.   No facility-administered encounter medications on file as of 11/09/2020.   Recent Relevant Labs: Lab Results  Component Value Date/Time   HGBA1C 6.7 (H) 09/04/2020 03:35 PM   HGBA1C 6.5 03/01/2020 01:40 PM   MICROALBUR 0.4 03/12/2019 01:54 PM    Kidney Function Lab Results  Component Value Date/Time   CREATININE 1.01 (H) 10/26/2020 10:32 AM   CREATININE 0.91 09/13/2020 02:16 PM   CREATININE 0.98 06/28/2020 04:19 PM   CREATININE 1.19 (H) 03/12/2019 01:54 PM   CREATININE 1.0  06/30/2014 02:48 PM   CREATININE 1.1 12/29/2013 01:05 PM   GFR 61.04 06/28/2020 04:19 PM   GFRNONAA >60 10/26/2020 10:32 AM   GFRAA >60 07/07/2019 02:04 PM    Current antihyperglycemic regimen:  None  What recent interventions/DTPs have been made to improve glycemic control:  No recent interventions or DTPs.  Have there been any recent hospitalizations or ED visits since last visit with CPP? No  Patient denies hypoglycemic symptoms.  Patient denies hyperglycemic symptoms.  How often are you checking your blood sugar?  once daily  What are your blood sugars ranging?  Fasting: 85-90 Before meals: n/a After meals: n/a Bedtime: n/a  During the week, how often does your blood glucose drop below 70? Never  Are you checking your feet daily/regularly? Patient states she does monitor her feet regularly.  Adherence Review: Is the patient currently on a STATIN medication? Yes Is the patient currently on ACE/ARB medication? No Does the patient have >5 day gap between last estimated fill dates? No  Patient states her oxygen dropped to 88 last night. She states this gives her a headache. She states she was recently diagnosed with lung cancer.   Care Gaps: Medicare Annual Wellness: Due now - patient states she will schedule this at a later time. Ophthalmology Exam: Overdue - never done  Foot Exam: Overdue - never done Hemoglobin A1C: 6.7% on 09/04/2020 Urine Microalbumin: Overdue since 03/11/2020 Colonoscopy: Next due on 07/25/2027 Mammogram: Overdue since 05/28/2019  Future Appointments  Date Time Provider [place]  11/10/2020  3:00 PM Margaretha Seeds, MD [facility] None  11/13/2020  9:30 AM [facility] [place] FLUSH [facility] None  11/13/2020 10:00 AM Orson Slick, MD [facility] None  11/13/2020 11:15 AM [facility] INFUSION [facility] None  11/13/2020  2:30 PM Jennet Maduro, RD [facility] None  11/14/2020  1:30 PM [facility] INFUSION [facility] None  11/15/2020  2:00 PM [facility] INFUSION [facility] None  11/17/2020 11:40 AM Lovorn, Megan, MD [facility] CPR  11/17/2020  2:00 PM [facility] [place] FLUSH [facility] None  11/20/2020  3:00 PM [facility] CCM PHARMACIST [facility] PEC  12/04/2020 10:00 AM [facility] [place] FLUSH [facility] None  12/04/2020 10:40 AM Orson Slick, MD [facility] None  12/04/2020 11:30 AM [facility] INFUSION [facility] None  12/05/2020  1:30 PM [facility] INFUSION [facility] None  12/06/2020  2:00 PM [facility] INFUSION [facility] None  12/08/2020  1:00 PM [facility] [place]  FLUSH [facility] None  01/05/2021 12:30 PM Allwardt, Randa Evens, PA-C [facility] PEC  02/15/2021  2:00 PM O'Neal, Cassie Freer, MD [facility]    Star Rating Drugs: Atorvastatin 20 mg last filled 08/23/2020 90 DS  April D Calhoun, [place] Pharmacist Assistant [phone]

## 2020-11-10 ENCOUNTER — Encounter: Payer: Self-pay | Admitting: Hematology and Oncology

## 2020-11-10 ENCOUNTER — Other Ambulatory Visit: Payer: Self-pay

## 2020-11-10 ENCOUNTER — Ambulatory Visit: Payer: Medicare HMO | Admitting: Pulmonary Disease

## 2020-11-10 ENCOUNTER — Encounter: Payer: Self-pay | Admitting: Pulmonary Disease

## 2020-11-10 VITALS — BP 128/76 | HR 91 | Temp 98.1°F | Ht 62.0 in | Wt 210.8 lb

## 2020-11-10 DIAGNOSIS — G4734 Idiopathic sleep related nonobstructive alveolar hypoventilation: Secondary | ICD-10-CM

## 2020-11-10 DIAGNOSIS — Z720 Tobacco use: Secondary | ICD-10-CM

## 2020-11-10 DIAGNOSIS — F17210 Nicotine dependence, cigarettes, uncomplicated: Secondary | ICD-10-CM | POA: Diagnosis not present

## 2020-11-10 DIAGNOSIS — J449 Chronic obstructive pulmonary disease, unspecified: Secondary | ICD-10-CM | POA: Diagnosis not present

## 2020-11-10 DIAGNOSIS — R69 Illness, unspecified: Secondary | ICD-10-CM | POA: Diagnosis not present

## 2020-11-10 MED ORDER — TRELEGY ELLIPTA 200-62.5-25 MCG/INH IN AEPB: 1.0000 | INHALATION_SPRAY | Freq: Every day | RESPIRATORY_TRACT | 5 refills | Status: DC

## 2020-11-10 MED ORDER — TRELEGY ELLIPTA 100-62.5-25 MCG/INH IN AEPB: 1.0000 | INHALATION_SPRAY | Freq: Every day | RESPIRATORY_TRACT | 6 refills | Status: DC

## 2020-11-10 NOTE — Patient Instructions (Signed)
Small cell carcinoma of the lung, stage IVa --Planned for chemotherapy next week  Nocturnal Hypoxemia on 3L O2 --CONTINUE supplemental oxygen at 3L nightly  COPD  --INCREASE Trelegy 200-62.5-25 mcg ONE puff ONCE a day --CONTINUE Albuterol AS NEEDED  Follow-up with me in 5 months

## 2020-11-10 NOTE — Progress Notes (Signed)
Called pt to introduce myself as her Arboriculturist, discuss copay assistance and the [employer].  Pt would like to apply for copay assistance and gave me consent to apply in her behalf so I applied to the [employer] and she was conditionally approved for $6000 from 11/10/20 - 11/10/21 for Tecentriq, Carboplatin & Etoposide.  Pt has 21 days to provide the foundation with income and ins verification.  Once received I will fax to the foundation in the pt's behalf.  I also informed her of the [employer], went over what it covers and gave her the income requirement.  Pt would like to apply so she will bring her proof of income on 11/13/20.  If approved I will give her an expense sheet and my card for any questions or concerns she may have in the future.

## 2020-11-10 NOTE — Progress Notes (Signed)
@Patient  ID: [name], female    DOB: 07-Aug-1955, 65 y.o.   MRN: [id number]  Chief Complaint  Patient presents with   Follow-up    Pt is here for a follow up after recent PET.    [name] is a 65 year old female active smoker with COPD and chronic respiratory failure, scoliosis and DDD who presents for follow-up. She presents with her husband  Since our last visit she was started on Trelegy. She reports shortness of breath is occurring more frequently and seems to occur in morning and late evenings. Wheezing and coughing with yellow-gray sputum are stable. Reports upper chest congestion. She has been checking her oxygen 84-88% in the morning. She reports confusion in the mornings.  At baseline, she has shortness of breath with talking and short distances. She is able to climb stairs one step at a time and goes slow pace. Her activity is limited due to back and bilateral hip pain s/p multiple hip surgeries. Endorses wheezing daily. Has chronic cough with some yellow-gray sputum production. She is not compliant with her home oxygen for > 1 year due to difficulty tolerate the tubing. She has tried Spiriva which she tolerated however insurance did not pay for it. Nebulizers were tolerable however the setup and time spent on the machine was cumbersome.  11/10/20 Since our last visit, she was diagnosed with small cell carcinoma of lung confirmed via EUS. She is scheduled for treatment starting 11/13/20.   She reports she is coughing with chest congestion. She has decreased her cigarettes from 2.5 ppd cigarettes to 1.5 ppd. Denies wheezing. She is compliant with her Trelegy. Symptoms worsen at night. She uses her rescue inhaler 2-3 times a week.  Social History: Active smoker Daughter passed from OD two years ago Currently has grandson living with her, stressor  Past Medical History:  Diagnosis Date   Allergic rhinitis    Anemia    Anxiety    Chicken pox    Chronic back pain     COPD (chronic obstructive pulmonary disease) (HCC)    Depression    DM (diabetes mellitus) ([place])    Essential hypertension    GERD (gastroesophageal reflux disease)    Headache    migraines   History of gastritis    EGD 2015   History of home oxygen therapy    2 liters at hs last 6 months   Hyperlipidemia    Hypothyroidism    Migraines    Osteoarthritis    oa   Scoliosis     Outpatient Medications Prior to Visit  Medication Sig Dispense Refill   albuterol (PROAIR HFA) 108 (90 Base) MCG/ACT inhaler 2 puffs every 4 hours as needed only  if your can't catch your breath 18 g 3   ALPRAZolam (XANAX) 1 MG tablet Take 1 tablet po BID as needed for anxiety. 60 tablet 2   aspirin EC 81 MG tablet Take 1 tablet (81 mg total) by mouth daily. Swallow whole. 90 tablet 3   Aspirin-Acetaminophen-Caffeine (GOODY HEADACHE PO) Take 1 packet by mouth 3 (three) times daily as needed (headaches.).     atorvastatin (LIPITOR) 20 MG tablet Take 1 tablet (20 mg total) by mouth at bedtime. 90 tablet 1   cyclobenzaprine (FLEXERIL) 10 MG tablet Take 1 tablet (10 mg total) by mouth 3 (three) times daily as needed for muscle spasms. 90 tablet 5   dicyclomine (BENTYL) 10 MG capsule Take 1 capsule (10 mg total) by mouth  2 (two) times daily as needed for spasms. 30 capsule 0   DULoxetine (CYMBALTA) 60 MG capsule Take 2 capsules (120 mg total) by mouth daily. (Patient taking differently: Take 120 mg by mouth at bedtime.) 60 capsule 5   fluticasone (FLONASE) 50 MCG/ACT nasal spray Place 2 sprays into both nostrils daily. 16 g 6   Fluticasone-Umeclidin-Vilant (TRELEGY ELLIPTA) 100-62.5-25 MCG/INH AEPB Inhale 1 puff into the lungs daily. 60 each 6   furosemide (LASIX) 20 MG tablet TAKE 1 TABLET BY MOUTH EVERY DAY AS NEEDED 30 tablet 90   glucose blood test strip Use as instructed 300 each 11   HYDROcodone-acetaminophen (NORCO) 10-325 MG tablet Take 1 tablet by mouth every 6 (six) hours as needed. 120 tablet 0    levothyroxine (SYNTHROID) 150 MCG tablet TAKE 1 TABLET BY MOUTH DAILY BEFORE breakfast 30 tablet 3   Lidocaine 4 % PTCH Place 1 patch onto the skin daily as needed (pain.).     lidocaine-prilocaine (EMLA) cream Apply 1 application topically as needed. 30 g 0   loratadine (CLARITIN) 10 MG tablet Take 10 mg by mouth daily.     omeprazole (PRILOSEC) 40 MG capsule TAKE ONE CAPSULE BY MOUTH TWICE DAILY 60 capsule 2   ondansetron (ZOFRAN) 8 MG tablet Take 1 tablet (8 mg total) by mouth every 8 (eight) hours as needed. 30 tablet 0   OXYGEN Inhale 2 L into the lungs at bedtime. Uses At Night     potassium chloride (KLOR-CON) 10 MEQ tablet Take 1 tablet (10 mEq total) by mouth at bedtime. 90 tablet 1   prochlorperazine (COMPAZINE) 10 MG tablet Take 1 tablet (10 mg total) by mouth every 6 (six) hours as needed for nausea or vomiting. 30 tablet 0   triamterene-hydrochlorothiazide (MAXZIDE-25) 37.5-25 MG tablet Take 1 tablet by mouth daily. 90 tablet 1   HYDROcodone-acetaminophen (NORCO) 5-325 MG tablet Take 1 tablet by mouth every 6 (six) hours as needed for moderate pain. 120 tablet 0   No facility-administered medications prior to visit.   Review of Systems  Constitutional:  Negative for chills, diaphoresis, fever, malaise/fatigue and weight loss.  HENT:  Positive for congestion.   Respiratory:  Positive for cough and sputum production. Negative for hemoptysis, shortness of breath and wheezing.   Cardiovascular:  Negative for chest pain, palpitations and leg swelling.     BP 128/76 (BP Location: Left Wrist, Patient Position: Sitting, Cuff Size: Normal)   Pulse 91   Temp 98.1 F (36.7 C) (Oral)   Ht 5\' 2"  (1.575 m)   Wt 210 lb 12.8 oz (95.6 kg)   SpO2 95% Comment: RA  BMI 38.56 kg/m   Physical Exam: General: Well-appearing, no acute distress HENT: [place], AT Eyes: EOMI, no scleral icterus Respiratory: Clear to auscultation bilaterally.  No crackles, wheezing or rales Cardiovascular: RRR,  -M/R/G, no JVD Extremities:-Edema,-tenderness Neuro: AAO x4, CNII-XII grossly intact Psych: Normal mood, normal affect  Data Reviewed:  PFTs: 05/26/17 FVC 2.38 (72%) FEV1 1.6 (63%) Ratio 63 TLC 105% RV 141% DLCO 55% Interpretation: Moderate obstructive defect with significant bronchodilator response in FEV1. With presence and airtrapping and reduced gas exchange, findings are consistent with emphysema.  Labs: - Allergy profile 03/18/17 >  Eos 0.3 /  IgE  4 neg RAST  Alpha-1 December 05, 2017 normal, MM, 174  Chest imaging: HRCT chest 12/24/2017-no ILD, mild to moderate emphysema CT A/P 07/05/20- right infrahilar enlargement vs overlaying right lower lobe lung nodule. Moderate stool in colon. CT Angio Chest  Aort 07/26/20 Macrolobulated RLL nodule vs enlarged right infrahilar lymph node with punctate satellite nodule, paraseptal emphysema CT Coronary 09/21/20 - Macrolobulated RLLL 2.1 x 1.5 cm lung nodule with adjacent 41mm nodule PET 5/85/27 - Hypermetabolic 2cm RLL lesion of the lung and 15mm soft tissue between descending duodenum and pancreatic head, emphysema MR Brain 11/04/20 - No metastatic disease  Assessment & Plan:  65 year old female active smoke with COPD/emphysema, nocturnal hypoxemia, scoliosis and DDD who presents for follow-up. Recent chest imaging with follow-up PET concerning for malignancy. Underwent EUS with FNA which confirmed small cell carcinoma of the lung. We discussed smoking cessation, oxygen compliance and adjustment of bronchodilators as noted below:  Small cell carcinoma of the lung, stage IVa --Planned for chemotherapy next week  Nocturnal Hypoxemia on 3L O2 --CONTINUE supplemental oxygen at 3L nightly  COPD  --INCREASE Trelegy 200-62.5-25 mcg ONE puff ONCE a day --CONTINUE Albuterol AS NEEDED  Tobacco abuse Patient is an active smoker. Currently 2.5>1.5 ppd We discussed smoking cessation for  5 minutes. We discussed triggers and stressors and ways to deal  with them. We discussed barriers to continued smoking and benefits of smoking cessation. Provided patient with information cessation techniques and interventions.   Health Maintenance Immunization History  Administered Date(s) Administered   Influenza,inj,Quad PF,6+ Mos 10/02/2018, 11/30/2019   Influenza-Unspecified 01/16/2011, 11/26/2011, 12/22/2012, 11/17/2018   PFIZER Comirnaty(Gray Top)Covid-19 Tri-Sucrose Vaccine 05/17/2020   PFIZER(Purple Top)SARS-COV-2 Vaccination 10/20/2019, 11/10/2019   PNEUMOCOCCAL CONJUGATE-20 09/04/2020   Td 06/07/2005   CT Lung Screen - not qualified  No orders of the defined types were placed in this encounter.  Meds ordered this encounter  Medications   DISCONTD: Fluticasone-Umeclidin-Vilant (TRELEGY ELLIPTA) 100-62.5-25 MCG/INH AEPB    Sig: Inhale 1 puff into the lungs daily.    Dispense:  60 each    Refill:  6   Fluticasone-Umeclidin-Vilant (TRELEGY ELLIPTA) 200-62.5-25 MCG/INH AEPB    Sig: Inhale 1 puff into the lungs daily.    Dispense:  60 each    Refill:  5   No follow-ups on file.  I have spent a total time of 39-minutes on the day of the appointment reviewing prior documentation, coordinating care and discussing medical diagnosis and plan with the patient/family. Past medical history, allergies, medications were reviewed. Pertinent imaging, labs and tests included in this note have been reviewed and interpreted independently by me.  Kendra Galvao Rodman Pickle, MD 11/10/2020

## 2020-11-11 DIAGNOSIS — J449 Chronic obstructive pulmonary disease, unspecified: Secondary | ICD-10-CM | POA: Diagnosis not present

## 2020-11-11 DIAGNOSIS — N189 Chronic kidney disease, unspecified: Secondary | ICD-10-CM | POA: Diagnosis not present

## 2020-11-13 ENCOUNTER — Other Ambulatory Visit: Payer: Self-pay | Admitting: Hematology and Oncology

## 2020-11-13 ENCOUNTER — Encounter: Payer: Self-pay | Admitting: Hematology and Oncology

## 2020-11-13 ENCOUNTER — Other Ambulatory Visit: Payer: Self-pay

## 2020-11-13 ENCOUNTER — Inpatient Hospital Stay: Payer: Medicare HMO | Admitting: Hematology and Oncology

## 2020-11-13 ENCOUNTER — Inpatient Hospital Stay: Payer: Medicare HMO

## 2020-11-13 VITALS — BP 146/74 | HR 88 | Temp 98.2°F | Resp 18 | Wt 217.4 lb

## 2020-11-13 DIAGNOSIS — M1990 Unspecified osteoarthritis, unspecified site: Secondary | ICD-10-CM | POA: Diagnosis not present

## 2020-11-13 DIAGNOSIS — E119 Type 2 diabetes mellitus without complications: Secondary | ICD-10-CM | POA: Diagnosis not present

## 2020-11-13 DIAGNOSIS — Z823 Family history of stroke: Secondary | ICD-10-CM | POA: Diagnosis not present

## 2020-11-13 DIAGNOSIS — Z95828 Presence of other vascular implants and grafts: Secondary | ICD-10-CM | POA: Diagnosis not present

## 2020-11-13 DIAGNOSIS — Z5111 Encounter for antineoplastic chemotherapy: Secondary | ICD-10-CM | POA: Diagnosis present

## 2020-11-13 DIAGNOSIS — Z5189 Encounter for other specified aftercare: Secondary | ICD-10-CM | POA: Diagnosis not present

## 2020-11-13 DIAGNOSIS — Z79899 Other long term (current) drug therapy: Secondary | ICD-10-CM | POA: Diagnosis not present

## 2020-11-13 DIAGNOSIS — C3490 Malignant neoplasm of unspecified part of unspecified bronchus or lung: Secondary | ICD-10-CM

## 2020-11-13 DIAGNOSIS — Z814 Family history of other substance abuse and dependence: Secondary | ICD-10-CM | POA: Diagnosis not present

## 2020-11-13 DIAGNOSIS — Z8249 Family history of ischemic heart disease and other diseases of the circulatory system: Secondary | ICD-10-CM | POA: Diagnosis not present

## 2020-11-13 DIAGNOSIS — Z811 Family history of alcohol abuse and dependence: Secondary | ICD-10-CM | POA: Diagnosis not present

## 2020-11-13 DIAGNOSIS — C3431 Malignant neoplasm of lower lobe, right bronchus or lung: Secondary | ICD-10-CM | POA: Diagnosis present

## 2020-11-13 DIAGNOSIS — F17210 Nicotine dependence, cigarettes, uncomplicated: Secondary | ICD-10-CM | POA: Diagnosis not present

## 2020-11-13 DIAGNOSIS — Z5112 Encounter for antineoplastic immunotherapy: Secondary | ICD-10-CM | POA: Diagnosis not present

## 2020-11-13 DIAGNOSIS — Z8261 Family history of arthritis: Secondary | ICD-10-CM | POA: Diagnosis not present

## 2020-11-13 DIAGNOSIS — Z836 Family history of other diseases of the respiratory system: Secondary | ICD-10-CM | POA: Diagnosis not present

## 2020-11-13 DIAGNOSIS — R69 Illness, unspecified: Secondary | ICD-10-CM | POA: Diagnosis not present

## 2020-11-13 DIAGNOSIS — Z801 Family history of malignant neoplasm of trachea, bronchus and lung: Secondary | ICD-10-CM | POA: Diagnosis not present

## 2020-11-13 DIAGNOSIS — E785 Hyperlipidemia, unspecified: Secondary | ICD-10-CM | POA: Diagnosis not present

## 2020-11-13 DIAGNOSIS — Z23 Encounter for immunization: Secondary | ICD-10-CM | POA: Diagnosis not present

## 2020-11-13 DIAGNOSIS — E039 Hypothyroidism, unspecified: Secondary | ICD-10-CM | POA: Diagnosis not present

## 2020-11-13 DIAGNOSIS — Z820 Family history of epilepsy and other diseases of the nervous system: Secondary | ICD-10-CM | POA: Diagnosis not present

## 2020-11-13 LAB — CBC WITH DIFFERENTIAL (CANCER CENTER ONLY)
Abs Immature Granulocytes: 0.06 10*3/uL (ref 0.00–0.07)
Basophils Absolute: 0 10*3/uL (ref 0.0–0.1)
Basophils Relative: 0 %
Eosinophils Absolute: 0.2 10*3/uL (ref 0.0–0.5)
Eosinophils Relative: 2 %
HCT: 41.5 % (ref 36.0–46.0)
Hemoglobin: 13.4 g/dL (ref 12.0–15.0)
Immature Granulocytes: 1 %
Lymphocytes Relative: 25 %
Lymphs Abs: 2.9 10*3/uL (ref 0.7–4.0)
MCH: 26.2 pg (ref 26.0–34.0)
MCHC: 32.3 g/dL (ref 30.0–36.0)
MCV: 81.2 fL (ref 80.0–100.0)
Monocytes Absolute: 0.9 10*3/uL (ref 0.1–1.0)
Monocytes Relative: 8 %
Neutro Abs: 7.5 10*3/uL (ref 1.7–7.7)
Neutrophils Relative %: 64 %
Platelet Count: 204 10*3/uL (ref 150–400)
RBC: 5.11 MIL/uL (ref 3.87–5.11)
RDW: 17.3 % — ABNORMAL HIGH (ref 11.5–15.5)
WBC Count: 11.6 10*3/uL — ABNORMAL HIGH (ref 4.0–10.5)
nRBC: 0 % (ref 0.0–0.2)

## 2020-11-13 LAB — CMP (CANCER CENTER ONLY)
ALT: 15 U/L (ref 0–44)
AST: 19 U/L (ref 15–41)
Albumin: 3.4 g/dL — ABNORMAL LOW (ref 3.5–5.0)
Alkaline Phosphatase: 75 U/L (ref 38–126)
Anion gap: 11 (ref 5–15)
BUN: 10 mg/dL (ref 8–23)
CO2: 24 mmol/L (ref 22–32)
Calcium: 8.9 mg/dL (ref 8.9–10.3)
Chloride: 99 mmol/L (ref 98–111)
Creatinine: 0.81 mg/dL (ref 0.44–1.00)
GFR, Estimated: >60 mL/min (ref >60)
Glucose, Bld: 101 mg/dL — ABNORMAL HIGH (ref 70–99)
Potassium: 3.8 mmol/L (ref 3.5–5.1)
Sodium: 134 mmol/L — ABNORMAL LOW (ref 135–145)
Total Bilirubin: 0.4 mg/dL (ref 0.3–1.2)
Total Protein: 6.8 g/dL (ref 6.5–8.1)

## 2020-11-13 LAB — TSH: TSH: 0.893 u[IU]/mL (ref 0.308–3.960)

## 2020-11-13 MED ORDER — SODIUM CHLORIDE 0.9% FLUSH
10.0000 mL | INTRAVENOUS | Status: DC | PRN
  Administered 2020-11-13: 10 mL

## 2020-11-13 MED ORDER — PALONOSETRON HCL INJECTION 0.25 MG/5ML
0.2500 mg | Freq: Once | INTRAVENOUS | Status: AC
  Administered 2020-11-13: 0.25 mg via INTRAVENOUS
  Filled 2020-11-13: qty 5

## 2020-11-13 MED ORDER — SODIUM CHLORIDE 0.9 % IV SOLN
1200.0000 mg | Freq: Once | INTRAVENOUS | Status: AC
  Administered 2020-11-13: 1200 mg via INTRAVENOUS
  Filled 2020-11-13: qty 20

## 2020-11-13 MED ORDER — SODIUM CHLORIDE 0.9 % IV SOLN
653.0000 mg | Freq: Once | INTRAVENOUS | Status: AC
  Administered 2020-11-13: 650 mg via INTRAVENOUS
  Filled 2020-11-13: qty 65

## 2020-11-13 MED ORDER — SODIUM CHLORIDE 0.9 % IV SOLN
10.0000 mg | Freq: Once | INTRAVENOUS | Status: AC
  Administered 2020-11-13: 10 mg via INTRAVENOUS
  Filled 2020-11-13: qty 10

## 2020-11-13 MED ORDER — SODIUM CHLORIDE 0.9 % IV SOLN
150.0000 mg | Freq: Once | INTRAVENOUS | Status: AC
  Administered 2020-11-13: 150 mg via INTRAVENOUS
  Filled 2020-11-13: qty 150

## 2020-11-13 MED ORDER — SODIUM CHLORIDE 0.9% FLUSH
10.0000 mL | Freq: Once | INTRAVENOUS | Status: AC
  Administered 2020-11-13: 10 mL via INTRAVENOUS

## 2020-11-13 MED ORDER — SODIUM CHLORIDE 0.9 % IV SOLN
100.0000 mg/m2 | Freq: Once | INTRAVENOUS | Status: AC
  Administered 2020-11-13: 200 mg via INTRAVENOUS
  Filled 2020-11-13: qty 10

## 2020-11-13 MED ORDER — SODIUM CHLORIDE 0.9 % IV SOLN: Freq: Once | INTRAVENOUS | Status: AC

## 2020-11-13 MED ORDER — HEPARIN SOD (PORK) LOCK FLUSH 100 UNIT/ML IV SOLN
500.0000 [IU] | Freq: Once | INTRAVENOUS | Status: AC | PRN
  Administered 2020-11-13: 500 [IU]

## 2020-11-13 NOTE — Progress Notes (Signed)
While deaccessing patient's port, some of the skin glue came off on dressing due to guaze not being in place. The patient declined any pain, there was no bleeding noted and the wound was found to be mostly healed. A sterile nonstick dressing was placed over the site.

## 2020-11-13 NOTE — Progress Notes (Signed)
Pt is approved for the $1000 [employer] grant.  

## 2020-11-13 NOTE — Progress Notes (Signed)
[place] [phone]   [phone]  PROGRESS NOTE  Patient Care Team: Allwardt, Randa Evens, PA-C as PCP - General (Physician Assistant) Milus Banister, MD as Attending Physician (Gastroenterology) Tanda Rockers, MD as Consulting Physician (Pulmonary Disease) Dr. Darylene Price, MD as Consulting Physician (Orthopedic Surgery) Latanya Maudlin, MD as Consulting Physician (Orthopedic Surgery) Kathrynn Ducking, MD as Consulting Physician (Neurology) Okey Regal, [place] as Consulting Physician (Optometry) [name], [facility] as Pharmacist (Pharmacist) Cameron Sprang, MD as Consulting Physician (Neurology)  Hematological/Oncological History # Small Cell Lung Cancer, Extensive Stage 07/05/2020: CT abdomen for lower abdominal pain. New pulmonary nodular density noted 07/06/2020: CT chest showed 2.2 cm macrolobulated right lower lobe pulmonary nodule (favored) versus pathologically enlarged infrahilar lymph node 10/13/2020: PET CT scan performed, findings show 2 cm right lower lobe lung mass is hypermetabolic and consistent with primary lung neoplasm. Additionally found hypermetabolic 17 mm soft tissue lesion between the descending duodenum and the pancreatic head  10/19/2020: EGD to biopsy hypermetabolic lymph node. Biopsy results show small cell lung cancer 10/26/2020: establish care with Dr. Lorenso Courier  11/13/2020: Cycle 1 Day 1 of Carbo/Etop/Atezolizumab  Interval History:  [name] 65 y.o. female with medical history significant for extensive stage small cell lung cancer who presents for a follow up visit. The patient's last visit was on 10/26/2020 at which time she established care. In the interim since the last visit she has undergone MRI brain which shows no evidence of intracranial disease and has undergone port placement.  On exam today Mrs. Downs is accompanied by her husband.  She reports that she underwent the MRI brain and port placement  without difficulty.  She also did benefit from the chemotherapy education session.  She reports that she has a clear understanding of what to do moving forward and did not have any additional questions concerns or comments today.  She currently denies any fevers, chills, sweats, nausea, vomiting or diarrhea.  A full 10 point ROS is listed below.  The bulk of our discussion focused on assuring that the patient had everything she needed to start chemotherapy including supportive medications, port placement, and chemotherapy education.  The patient is willing and able to proceed at this time.  MEDICAL HISTORY:  Past Medical History:  Diagnosis Date   Allergic rhinitis    Anemia    Anxiety    Chicken pox    Chronic back pain    COPD (chronic obstructive pulmonary disease) (HCC)    Depression    DM (diabetes mellitus) ([place])    Essential hypertension    GERD (gastroesophageal reflux disease)    Headache    migraines   History of gastritis    EGD 2015   History of home oxygen therapy    2 liters at hs last 6 months   Hyperlipidemia    Hypothyroidism    Migraines    Osteoarthritis    oa   Scoliosis     SURGICAL HISTORY: Past Surgical History:  Procedure Laterality Date   APPENDECTOMY     1985   BIOPSY  07/24/2017   Procedure: BIOPSY;  Surgeon: Milus Banister, MD;  Location: [facility];  Service: Endoscopy;;   CARDIAC CATHETERIZATION N/A 10/31/2015   Procedure: Left Heart Cath and Coronary Angiography;  Surgeon: Leonie Man, MD;  Location: [facility];  Service: Cardiovascular;  Laterality: N/A;   CARPAL TUNNEL RELEASE Left    CARPAL TUNNEL RELEASE Right  CHOLECYSTECTOMY  late 1980's   COLONOSCOPY WITH PROPOFOL N/A 07/24/2017   Procedure: COLONOSCOPY WITH PROPOFOL;  Surgeon: Milus Banister, MD;  [facility];  Service: Endoscopy;  Laterality: N/A;   ESOPHAGOGASTRODUODENOSCOPY N/A 07/24/2017   Procedure: ESOPHAGOGASTRODUODENOSCOPY (EGD);  Surgeon: Milus Banister, MD;  Location: [facility];  Service: Endoscopy;  Laterality: N/A;   ESOPHAGOGASTRODUODENOSCOPY (EGD) WITH PROPOFOL N/A 10/19/2020   Procedure: ESOPHAGOGASTRODUODENOSCOPY (EGD) WITH PROPOFOL;  Surgeon: Milus Banister, MD;  [facility];  Service: Endoscopy;  Laterality: N/A;   FINE NEEDLE ASPIRATION N/A 10/19/2020   Procedure: FINE NEEDLE ASPIRATION (FNA) LINEAR;  Surgeon: Milus Banister, MD;  [facility];  Service: Endoscopy;  Laterality: N/A;   GALLBLADDER SURGERY  1991   HIP CLOSED REDUCTION Right 01/08/2016   Procedure: CLOSED MANIPULATION HIP;  Surgeon: Susa Day, MD;  [facility];  Service: Orthopedics;  Laterality: Right;   HIP CLOSED REDUCTION Right 01/19/2016   Procedure: ATTEMPTED CLOSED REDUCTION RIGHT HIP;  Surgeon: Wylene Simmer, MD;  [facility];  Service: Orthopedics;  Laterality: Right;   HIP CLOSED REDUCTION Right 01/20/2016   Procedure: CLOSED REDUCTION RIGHT TOTAL HIP;  Surgeon: Paralee Cancel, MD;  [facility];  Service: Orthopedics;  Laterality: Right;   HIP CLOSED REDUCTION Right 02/17/2016   Procedure: CLOSED REDUCTION RIGHT TOTAL HIP;  Surgeon: Rod Can, MD;  Location: [facility];  Service: Orthopedics;  Laterality: Right;   HIP CLOSED REDUCTION Right 02/28/2016   Procedure: CLOSED REDUCTION HIP;  Surgeon: Nicholes Stairs, MD;  [facility];  Service: Orthopedics;  Laterality: Right;   IR IMAGING GUIDED PORT INSERTION  11/01/2020   POLYPECTOMY  07/24/2017   Procedure: POLYPECTOMY;  Surgeon: Milus Banister, MD;  [facility];  Service: Endoscopy;;   [place], with 1 ovary removed and 2 nd ovary removed 2003   TOTAL HIP ARTHROPLASTY Right    Original surgery 2006 with revision 2010   TOTAL HIP REVISION Right 01/01/2016   Procedure: TOTAL HIP REVISION;  Surgeon: Paralee Cancel, MD;  [facility];  Service: Orthopedics;  Laterality: Right;   TOTAL HIP  REVISION Right 03/02/2016   Procedure: TOTAL HIP REVISION;  Surgeon: Paralee Cancel, MD;  [facility];  Service: Orthopedics;  Laterality: Right;   TOTAL HIP REVISION Right 09/02/2016   Procedure: Right hip constrained liner- posterior;  Surgeon: Paralee Cancel, MD;  [facility];  Service: Orthopedics;  Laterality: Right;   ULNAR NERVE TRANSPOSITION Right    UPPER ESOPHAGEAL ENDOSCOPIC ULTRASOUND (EUS) N/A 10/19/2020   Procedure: UPPER ESOPHAGEAL ENDOSCOPIC ULTRASOUND (EUS);  Surgeon: Milus Banister, MD;  Location: [facility];  Service: Endoscopy;  Laterality: N/A;  periduodenal lesion    SOCIAL HISTORY: Social History   Socioeconomic History   Marital status: Married    Spouse name: Not on file   Number of children: 2   Years of education: Not on file   Highest education level: Not on file  Occupational History   Occupation: disabled   Occupation: disabled  Tobacco Use   Smoking status: Every Day    Packs/day: 2.50    Years: 46.00    Pack years: 115.00    Types: Cigarettes   Smokeless tobacco: Never   Tobacco comments:    Currently smoking 1.5ppd as of 11/10/20  Vaping Use   Vaping Use: Never used  Substance and Sexual Activity   Alcohol use: No  Drug use: No   Sexual activity: Not Currently    Partners: Male  Other Topics Concern   Not on file  Social History Narrative   Right handed    Caffeine~ 2 cups per day    Lives at home with husband (strained relationship)   Primary caretaker for disabled brother who had aneurism   Daughter died 2018-06-10    Social Determinants of Health   Financial Resource Strain: Low Risk    Difficulty of Paying Living Expenses: Not hard at all  Food Insecurity: Not on file  Transportation Needs: No Transportation Needs   Lack of Transportation (Medical): No   Lack of Transportation (Non-Medical): No  Physical Activity: Inactive   Days of Exercise per Week: 0 days   Minutes of Exercise per Session: 0 min  Stress: Stress  Concern Present   Feeling of Stress : To some extent  Social Connections: Moderately Integrated   Frequency of Communication with Friends and Family: More than three times a week   Frequency of Social Gatherings with Friends and Family: Once a week   Attends Religious Services: 1 to 4 times per year   Active Member of [employer] or Organizations: No   Attends Music therapist: Never   Marital Status: Married  Human resources officer Violence: Not At Risk   Fear of Current or Ex-Partner: No   Emotionally Abused: No   Physically Abused: No   Sexually Abused: No    FAMILY HISTORY: Family History  Problem Relation Age of Onset   COPD Mother    Heart disease Mother    Lung disease Father        Asbestosis   Heart attack Father    Heart disease Father    Cerebral aneurysm Brother    Aneurysm Brother        Brain   Drug abuse Daughter    Epilepsy Son    Alcohol abuse Son    Drug abuse Son    Arthritis Maternal Grandmother    Heart disease Maternal Grandmother    Asthma Maternal Grandfather    Cancer Maternal Grandfather    Arthritis Paternal Grandmother    Heart disease Paternal Grandmother    Stroke Paternal Grandmother    Early death Paternal Grandfather    Heart disease Paternal Grandfather     ALLERGIES:  is allergic to metformin and related, nsaids, aleve [naproxen sodium], codeine, penicillins, and sulfonamide derivatives.  MEDICATIONS:  Current Outpatient Medications  Medication Sig Dispense Refill   albuterol (PROAIR HFA) 108 (90 Base) MCG/ACT inhaler 2 puffs every 4 hours as needed only  if your can't catch your breath 18 g 3   ALPRAZolam (XANAX) 1 MG tablet Take 1 tablet po BID as needed for anxiety. 60 tablet 2   aspirin EC 81 MG tablet Take 1 tablet (81 mg total) by mouth daily. Swallow whole. 90 tablet 3   Aspirin-Acetaminophen-Caffeine (GOODY HEADACHE PO) Take 1 packet by mouth 3 (three) times daily as needed (headaches.).     atorvastatin (LIPITOR) 20 MG  tablet Take 1 tablet (20 mg total) by mouth at bedtime. 90 tablet 1   cyclobenzaprine (FLEXERIL) 10 MG tablet Take 1 tablet (10 mg total) by mouth 3 (three) times daily as needed for muscle spasms. 90 tablet 5   dicyclomine (BENTYL) 10 MG capsule Take 1 capsule (10 mg total) by mouth 2 (two) times daily as needed for spasms. 30 capsule 0   DULoxetine (CYMBALTA) 60 MG capsule Take 2  capsules (120 mg total) by mouth daily. (Patient taking differently: Take 120 mg by mouth at bedtime.) 60 capsule 5   fluticasone (FLONASE) 50 MCG/ACT nasal spray Place 2 sprays into both nostrils daily. 16 g 6   Fluticasone-Umeclidin-Vilant (TRELEGY ELLIPTA) 200-62.5-25 MCG/INH AEPB Inhale 1 puff into the lungs daily. 60 each 5   furosemide (LASIX) 20 MG tablet TAKE 1 TABLET BY MOUTH EVERY DAY AS NEEDED 30 tablet 90   glucose blood test strip Use as instructed 300 each 11   HYDROcodone-acetaminophen (NORCO) 10-325 MG tablet Take 1 tablet by mouth every 6 (six) hours as needed. 120 tablet 0   levothyroxine (SYNTHROID) 150 MCG tablet TAKE 1 TABLET BY MOUTH DAILY BEFORE breakfast 30 tablet 3   Lidocaine 4 % PTCH Place 1 patch onto the skin daily as needed (pain.).     lidocaine-prilocaine (EMLA) cream Apply 1 application topically as needed. 30 g 0   loratadine (CLARITIN) 10 MG tablet Take 10 mg by mouth daily.     omeprazole (PRILOSEC) 40 MG capsule TAKE ONE CAPSULE BY MOUTH TWICE DAILY 60 capsule 2   ondansetron (ZOFRAN) 8 MG tablet Take 1 tablet (8 mg total) by mouth every 8 (eight) hours as needed. 30 tablet 0   OXYGEN Inhale 2 L into the lungs at bedtime. Uses At Night     potassium chloride (KLOR-CON) 10 MEQ tablet Take 1 tablet (10 mEq total) by mouth at bedtime. 90 tablet 1   prochlorperazine (COMPAZINE) 10 MG tablet Take 1 tablet (10 mg total) by mouth every 6 (six) hours as needed for nausea or vomiting. 30 tablet 0   triamterene-hydrochlorothiazide (MAXZIDE-25) 37.5-25 MG tablet Take 1 tablet by mouth daily. 90  tablet 1   No current facility-administered medications for this visit.   Facility-Administered Medications Ordered in Other Visits  Medication Dose Route Frequency Provider Last Rate Last Admin   atezolizumab (TECENTRIQ) 1,200 mg in sodium chloride 0.9 % 250 mL chemo infusion  1,200 mg Intravenous Once Ledell Peoples IV, MD 270 mL/hr at 11/13/20 1318 1,200 mg at 11/13/20 1318   CARBOplatin (PARAPLATIN) 650 mg in sodium chloride 0.9 % 250 mL chemo infusion  650 mg Intravenous Once Orson Slick, MD       etoposide (VEPESID) 200 mg in sodium chloride 0.9 % 500 mL chemo infusion  100 mg/m2 (Treatment Plan Recorded) Intravenous Once Orson Slick, MD       heparin lock flush 100 unit/mL  500 Units Intracatheter Once PRN Orson Slick, MD       sodium chloride flush (NS) 0.9 % injection 10 mL  10 mL Intracatheter PRN Orson Slick, MD        REVIEW OF SYSTEMS:   Constitutional: ( - ) fevers, ( - )  chills , ( - ) night sweats Eyes: ( - ) blurriness of vision, ( - ) double vision, ( - ) watery eyes Ears, nose, mouth, throat, and face: ( - ) mucositis, ( - ) sore throat Respiratory: ( - ) cough, ( - ) dyspnea, ( - ) wheezes Cardiovascular: ( - ) palpitation, ( - ) chest discomfort, ( - ) lower extremity swelling Gastrointestinal:  ( - ) nausea, ( - ) heartburn, ( - ) change in bowel habits Skin: ( - ) abnormal skin rashes Lymphatics: ( - ) new lymphadenopathy, ( - ) easy bruising Neurological: ( - ) numbness, ( - ) tingling, ( - ) new weaknesses  Behavioral/Psych: ( - ) mood change, ( - ) new changes  All other systems were reviewed with the patient and are negative.  PHYSICAL EXAMINATION: ECOG PERFORMANCE STATUS: 1 - Symptomatic but completely ambulatory  Vitals:   11/13/20 1014  BP: (!) 146/74  Pulse: 88  Resp: 18  Temp: 98.2 F (36.8 C)  SpO2: 96%   Filed Weights   11/13/20 1014  Weight: 217 lb 6.4 oz (98.6 kg)    GENERAL: Well-appearing middle-age Caucasian  female, alert, no distress and comfortable SKIN: skin color, texture, turgor are normal, no rashes or significant lesions EYES: conjunctiva are pink and non-injected, sclera clear LUNGS: clear to auscultation and percussion with normal breathing effort HEART: regular rate & rhythm and no murmurs and no lower extremity edema Musculoskeletal: no cyanosis of digits and no clubbing  PSYCH: alert & oriented x 3, fluent speech NEURO: no focal motor/sensory deficits  LABORATORY DATA:  I have reviewed the data as listed CBC Latest Ref Rng & Units 11/13/2020 11/01/2020 10/26/2020  WBC 4.0 - 10.5 K/uL 11.6(H) 11.4(H) 13.2(H)  Hemoglobin 12.0 - 15.0 g/dL 13.4 13.4 14.4  Hematocrit 36.0 - 46.0 % 41.5 42.1 45.3  Platelets 150 - 400 K/uL 204 237 272    CMP Latest Ref Rng & Units 11/13/2020 10/26/2020 09/13/2020  Glucose 70 - 99 mg/dL 101(H) 90 65  BUN 8 - 23 mg/dL 10 10 8   Creatinine 0.44 - 1.00 mg/dL 0.81 1.01(H) 0.91  Sodium 135 - 145 mmol/L 134(L) 133(L) 134  Potassium 3.5 - 5.1 mmol/L 3.8 4.5 4.9  Chloride 98 - 111 mmol/L 99 97(L) 93(L)  CO2 22 - 32 mmol/L 24 25 24   Calcium 8.9 - 10.3 mg/dL 8.9 9.2 8.6(L)  Total Protein 6.5 - 8.1 g/dL 6.8 7.5 -  Total Bilirubin 0.3 - 1.2 mg/dL 0.4 0.5 -  Alkaline Phos 38 - 126 U/L 75 94 -  AST 15 - 41 U/L 19 25 -  ALT 0 - 44 U/L 15 21 -    No results found for: MPROTEIN Lab Results  Component Value Date   KPAFRELGTCHN 0.75 06/30/2014   LAMBDASER 3.78 (H) 06/30/2014   KAPLAMBRATIO 0.20 (L) 06/30/2014     RADIOGRAPHIC STUDIES: MR Brain W Wo Contrast  Result Date: 11/08/2020 CLINICAL DATA:  Small cell lung cancer, staging EXAM: MRI HEAD WITHOUT AND WITH CONTRAST TECHNIQUE: Multiplanar, multiecho pulse sequences of the brain and surrounding structures were obtained without and with intravenous contrast. CONTRAST:  57mL GADAVIST GADOBUTROL 1 MMOL/ML IV SOLN COMPARISON:  CT Head 07/07/2019; CT Head 02/02/2016. FINDINGS: Brain: No acute infarction,  hemorrhage, hydrocephalus, extra-axial collection or mass lesion. No abnormal enhancement. Scattered T2 hyperintense signal in the periventricular white matter, likely the sequela of chronic small vessel ischemic disease. Vascular: Normal flow voids. Skull and upper cervical spine: Normal marrow signal. Degenerative changes and C5-C6, which may cause mild spinal canal stenosis. Sinuses/Orbits: Negative. Other: The mastoids are well aerated. IMPRESSION: No acute intracranial process. No evidence of metastatic disease in the brain. Of Electronically Signed   By: Merilyn Baba M.D.   On: 11/08/2020 15:40   IR IMAGING GUIDED PORT INSERTION  Result Date: 11/01/2020 INDICATION: Extensive small cell lung cancer in need of durable venous access. She presents for port catheter placement. EXAM: IMPLANTED PORT A CATH PLACEMENT WITH ULTRASOUND AND FLUOROSCOPIC GUIDANCE MEDICATIONS: None. ANESTHESIA/SEDATION: Versed 2 mg IV; Fentanyl 100 mcg IV; Moderate Sedation Time:  19 minutes The patient was continuously monitored during the procedure by the interventional  radiology nurse under my direct supervision. FLUOROSCOPY TIME:  0 minutes, 37 seconds (3 mGy) COMPLICATIONS: None immediate. PROCEDURE: The right neck and chest was prepped with chlorhexidine, and draped in the usual sterile fashion using maximum barrier technique (cap and mask, sterile gown, sterile gloves, large sterile sheet, hand hygiene and cutaneous antiseptic). Local anesthesia was attained by infiltration with 1% lidocaine with epinephrine. Ultrasound demonstrated patency of the right internal jugular vein, and this was documented with an image. Under real-time ultrasound guidance, this vein was accessed with a 21 gauge micropuncture needle and image documentation was performed. A small dermatotomy was made at the access site with an 11 scalpel. A 0.018" wire was advanced into the SVC and the access needle exchanged for a 9F micropuncture vascular sheath. The  0.018" wire was then removed and a 0.035" wire advanced into the IVC. An appropriate location for the subcutaneous reservoir was selected below the clavicle and an incision was made through the skin and underlying soft tissues. The subcutaneous tissues were then dissected using a combination of blunt and sharp surgical technique and a pocket was formed. A single lumen power injectable portacatheter was then tunneled through the subcutaneous tissues from the pocket to the dermatotomy and the port reservoir placed within the subcutaneous pocket. The venous access site was then serially dilated and a peel away vascular sheath placed over the wire. The wire was removed and the port catheter advanced into position under fluoroscopic guidance. The catheter tip is positioned in the superior cavoatrial junction. This was documented with a spot image. The portacatheter was then tested and found to flush and aspirate well. The port was flushed with saline followed by 100 units/mL heparinized saline. The pocket was then closed in two layers using first subdermal inverted interrupted absorbable sutures followed by a running subcuticular suture. The epidermis was then sealed with Dermabond. The dermatotomy at the venous access site was also closed with Dermabond. IMPRESSION: Successful placement of a right IJ approach Power Port with ultrasound and fluoroscopic guidance. The catheter is ready for use. Electronically Signed   By: Jacqulynn Cadet M.D.   On: 11/01/2020 16:36    ASSESSMENT & PLAN [name] 65 y.o. female with medical history significant for extensive stage small cell lung cancer who presents for a follow up visit.   After review of the labs, review of the records, and discussion with the patient the patients findings are most consistent with extensive stage small cell lung cancer with metastasis from the right lower lobe to the lymph nodes of the abdomen.  At this time we will pursue triple therapy with  carboplatin, etoposide, and atezolizumab.  After 4 cycles we will convert to maintenance atezolizumab alone.  We previously discussed the risks and benefits of this therapy and the patient was in agreement to proceed with this treatment.  The treatment of choice consist of carboplatin, etoposide, and atezolizumab.  The regimen consists of carboplatin AUC of 5 IV on day 1, etoposide 100 mg per metered squared IV on day 1, 2, and 3 and atezolizumab 1200 mg on day 1.  This continues for 21-day cycles.  After 4 cycles the patient proceeds with atezolizumab maintenance therapy alone.   #Small Cell Lung Cancer, Extensive Stage -- MRI of the brain shows no evidence of intracranial spread --Findings are currently consistent with metastatic small cell lung cancer with metastatic spread to the lymph nodes of the abdomen --Prognosis is poor with anticipated life span of less than 12  months -- Patient has extensive stage due to metastatic spread to lymph nodes in the abdomen --Plan to proceed with carboplatin, etoposide, and atezolizumab --will consider prophylactic cranial radiation after start of chemotherapy.  Plan: --today is Cycle 1 Day 1 of Carbo/Etop/Atezolizumab --GCSF shot on Day 3 --RTC in 3 weeks for next cycle of treatment.    #Supportive Care -- chemotherapy education complete -- port placed -- zofran 8mg  q8H PRN and compazine 10mg  PO q6H for nausea -- EMLA cream for port -- no pain medication required at this time.   No orders of the defined types were placed in this encounter.   All questions were answered. The patient knows to call the clinic with any problems, questions or concerns.  A total of more than 30 minutes were spent on this encounter with face-to-face time and non-face-to-face time, including preparing to see the patient, ordering tests and/or medications, counseling the patient and coordination of care as outlined above.   Ledell Peoples, MD Department of  Hematology/Oncology [place] [facility] Phone: [phone] Pager: [phone] Email: [email or web address]  11/13/2020 1:37 PM

## 2020-11-13 NOTE — Progress Notes (Signed)
Nutrition Assessment   Reason for Assessment:   Patient identified on Malnutrition Screening report for weight loss, poor appetite   ASSESSMENT:  65 year old female with small cell lung cancer with soft tissue lesion in descending colon.    Past medical history of COPD, DM, HLD, smoker.  Met with patient during infusion.  Patient reports that she changed her eating habits about 6 months ago due to DM.  Patient cut out sweet tea and switched to water which she feels like contributed to her weight loss.  Reports that appetite has decreased.  Usually eat cereal for breakfast and peanut butter and strawberry preserve sandwich and cheetos between 3-6 pm.  Enjoys [employer] milkshakes and chocolate covered nuts.  Reports diarrhea and constipation which has been discussed with GI which led to diagnosis of lung cancer.  Reports some nausea and takes zofran with relief.       Medications:  Lasix, prilosec, zofran, compazine   Labs: reviewed   Anthropometrics:   Height: 62 inches Weight: 217 lb 6.4 oz today, wt prior to today 210 lb UBW: 20 lb weight loss in the last 6 months Per chart 217 lb on 03/21/20 BMI: 38  Stable weight, according to chart   NUTRITION DIAGNOSIS:  Inadequate oral intake related to altered GI function which led to cancer diagnosis as evidenced by poor po intake, weight loss   INTERVENTION:  Discussed importance of nutrition during treatment and weight maintenance.   Discussed incorporating foods rich in protein.  Discussed strategies to help with diarrhea and nausea.  Handouts provided   MONITORING, EVALUATION, GOAL: weight trends, intake   Next Visit: Monday, Nov 7 during infusion  Renton Berkley B. Zenia Resides, [place], [place] Registered Dietitian [phone] (mobile)

## 2020-11-13 NOTE — Patient Instructions (Signed)
[name]  Discharge Instructions: Thank you for choosing [facility] to provide your oncology and hematology care.   If you have a lab appointment with the [facility], please go directly to the [facility] and check in at the registration area.   Wear comfortable clothing and clothing appropriate for easy access to any Portacath or PICC line.   We strive to give you quality time with your provider. You may need to reschedule your appointment if you arrive late (15 or more minutes).  Arriving late affects you and other patients whose appointments are after yours.  Also, if you miss three or more appointments without notifying the office, you may be dismissed from the clinic at the provider's discretion.      For prescription refill requests, have your pharmacy contact our office and allow 72 hours for refills to be completed.    Today you received the following chemotherapy and/or immunotherapy agents: Tecentriq, Carboplatin, Etoposide.       To help prevent nausea and vomiting after your treatment, we encourage you to take your nausea medication as directed.  BELOW ARE SYMPTOMS THAT SHOULD BE REPORTED IMMEDIATELY: *FEVER GREATER THAN 100.4 F (38 C) OR HIGHER *CHILLS OR SWEATING *NAUSEA AND VOMITING THAT IS NOT CONTROLLED WITH YOUR NAUSEA MEDICATION *UNUSUAL SHORTNESS OF BREATH *UNUSUAL BRUISING OR BLEEDING *URINARY PROBLEMS (pain or burning when urinating, or frequent urination) *BOWEL PROBLEMS (unusual diarrhea, constipation, pain near the anus) TENDERNESS IN MOUTH AND THROAT WITH OR WITHOUT PRESENCE OF ULCERS (sore throat, sores in mouth, or a toothache) UNUSUAL RASH, SWELLING OR PAIN  UNUSUAL VAGINAL DISCHARGE OR ITCHING   Items with * indicate a potential emergency and should be followed up as soon as possible or go to the Emergency Department if any problems should occur.  Please show the CHEMOTHERAPY ALERT CARD or IMMUNOTHERAPY  ALERT CARD at check-in to the Emergency Department and triage nurse.  Should you have questions after your visit or need to cancel or reschedule your appointment, please contact [facility]  Dept: [phone]  and follow the prompts.  Office hours are 8:00 a.m. to 4:30 p.m. Monday - Friday. Please note that voicemails left after 4:00 p.m. may not be returned until the following business day.  We are closed weekends and major holidays. You have access to a nurse at all times for urgent questions. Please call the main number to the clinic Dept: [phone] and follow the prompts.   For any non-urgent questions, you may also contact your provider using MyChart. We now offer e-Visits for anyone 37 and older to request care online for non-urgent symptoms. For details visit [email or web address].   Also download the MyChart app! Go to the app store, search "MyChart", open the app, select [facility], and log in with your MyChart username and password.  Due to Covid, a mask is required upon entering the hospital/clinic. If you do not have a mask, one will be given to you upon arrival. For doctor visits, patients may have 1 support person aged 70 or older with them. For treatment visits, patients cannot have anyone with them due to current Covid guidelines and our immunocompromised population.   Atezolizumab injection What is this medication? ATEZOLIZUMAB (a te zoe LIZ ue mab) is a monoclonal antibody. It is used to treat bladder cancer (urothelial cancer), liver cancer, lung cancer, and melanoma. This medicine may be used for other purposes; ask your health care provider or  pharmacist if you have questions. COMMON BRAND NAME(S): Tecentriq What should I tell my care team before I take this medication? They need to know if you have any of these conditions: autoimmune diseases like Crohn's disease, ulcerative colitis, or lupus have had or planning to have an allogeneic stem  cell transplant (uses someone else's stem cells) history of organ transplant history of radiation to the chest nervous system problems like myasthenia gravis or Guillain-Barre syndrome an unusual or allergic reaction to atezolizumab, other medicines, foods, dyes, or preservatives pregnant or trying to get pregnant breast-feeding How should I use this medication? This medicine is for infusion into a vein. It is given by a health care professional in a hospital or clinic setting. A special MedGuide will be given to you before each treatment. Be sure to read this information carefully each time. Talk to your pediatrician regarding the use of this medicine in children. Special care may be needed. Overdosage: If you think you have taken too much of this medicine contact a poison control center or emergency room at once. NOTE: This medicine is only for you. Do not share this medicine with others. What if I miss a dose? It is important not to miss your dose. Call your doctor or health care professional if you are unable to keep an appointment. What may interact with this medication? Interactions have not been studied. This list may not describe all possible interactions. Give your health care provider a list of all the medicines, herbs, non-prescription drugs, or dietary supplements you use. Also tell them if you smoke, drink alcohol, or use illegal drugs. Some items may interact with your medicine. What should I watch for while using this medication? Your condition will be monitored carefully while you are receiving this medicine. You may need blood work done while you are taking this medicine. Do not become pregnant while taking this medicine or for at least 5 months after stopping it. Women should inform their doctor if they wish to become pregnant or think they might be pregnant. There is a potential for serious side effects to an unborn child. Talk to your health care professional or pharmacist  for more information. Do not breast-feed an infant while taking this medicine or for at least 5 months after the last dose. What side effects may I notice from receiving this medication? Side effects that you should report to your doctor or health care professional as soon as possible: allergic reactions like skin rash, itching or hives, swelling of the face, lips, or tongue black, tarry stools bloody or watery diarrhea breathing problems changes in vision chest pain or chest tightness chills facial flushing fever headache signs and symptoms of high blood sugar such as dizziness; dry mouth; dry skin; fruity breath; nausea; stomach pain; increased hunger or thirst; increased urination signs and symptoms of liver injury like dark yellow or brown urine; general ill feeling or flu-like symptoms; light-colored stools; loss of appetite; nausea; right upper belly pain; unusually weak or tired; yellowing of the eyes or skin stomach pain trouble passing urine or change in the amount of urine Side effects that usually do not require medical attention (report to your doctor or health care professional if they continue or are bothersome): bone pain cough diarrhea joint pain muscle pain muscle weakness swelling of arms or legs tiredness weight loss This list may not describe all possible side effects. Call your doctor for medical advice about side effects. You may report side effects to FDA at  1-800-FDA-1088. Where should I keep my medication? This drug is given in a hospital or clinic and will not be stored at home. NOTE: This sheet is a summary. It may not cover all possible information. If you have questions about this medicine, talk to your doctor, pharmacist, or health care provider.  2022 Elsevier/Gold Standard (2019-10-14 13:59:34) Carboplatin injection What is this medication? CARBOPLATIN (KAR boe pla tin) is a chemotherapy drug. It targets fast dividing cells, like cancer cells, and  causes these cells to die. This medicine is used to treat ovarian cancer and many other cancers. This medicine may be used for other purposes; ask your health care provider or pharmacist if you have questions. COMMON BRAND NAME(S): Paraplatin What should I tell my care team before I take this medication? They need to know if you have any of these conditions: blood disorders hearing problems kidney disease recent or ongoing radiation therapy an unusual or allergic reaction to carboplatin, cisplatin, other chemotherapy, other medicines, foods, dyes, or preservatives pregnant or trying to get pregnant breast-feeding How should I use this medication? This drug is usually given as an infusion into a vein. It is administered in a hospital or clinic by a specially trained health care professional. Talk to your pediatrician regarding the use of this medicine in children. Special care may be needed. Overdosage: If you think you have taken too much of this medicine contact a poison control center or emergency room at once. NOTE: This medicine is only for you. Do not share this medicine with others. What if I miss a dose? It is important not to miss a dose. Call your doctor or health care professional if you are unable to keep an appointment. What may interact with this medication? medicines for seizures medicines to increase blood counts like filgrastim, pegfilgrastim, sargramostim some antibiotics like amikacin, gentamicin, neomycin, streptomycin, tobramycin vaccines Talk to your doctor or health care professional before taking any of these medicines: acetaminophen aspirin ibuprofen ketoprofen naproxen This list may not describe all possible interactions. Give your health care provider a list of all the medicines, herbs, non-prescription drugs, or dietary supplements you use. Also tell them if you smoke, drink alcohol, or use illegal drugs. Some items may interact with your medicine. What  should I watch for while using this medication? Your condition will be monitored carefully while you are receiving this medicine. You will need important blood work done while you are taking this medicine. This drug may make you feel generally unwell. This is not uncommon, as chemotherapy can affect healthy cells as well as cancer cells. Report any side effects. Continue your course of treatment even though you feel ill unless your doctor tells you to stop. In some cases, you may be given additional medicines to help with side effects. Follow all directions for their use. Call your doctor or health care professional for advice if you get a fever, chills or sore throat, or other symptoms of a cold or flu. Do not treat yourself. This drug decreases your body's ability to fight infections. Try to avoid being around people who are sick. This medicine may increase your risk to bruise or bleed. Call your doctor or health care professional if you notice any unusual bleeding. Be careful brushing and flossing your teeth or using a toothpick because you may get an infection or bleed more easily. If you have any dental work done, tell your dentist you are receiving this medicine. Avoid taking products that contain aspirin, acetaminophen, ibuprofen,  naproxen, or ketoprofen unless instructed by your doctor. These medicines may hide a fever. Do not become pregnant while taking this medicine. Women should inform their doctor if they wish to become pregnant or think they might be pregnant. There is a potential for serious side effects to an unborn child. Talk to your health care professional or pharmacist for more information. Do not breast-feed an infant while taking this medicine. What side effects may I notice from receiving this medication? Side effects that you should report to your doctor or health care professional as soon as possible: allergic reactions like skin rash, itching or hives, swelling of the face,  lips, or tongue signs of infection - fever or chills, cough, sore throat, pain or difficulty passing urine signs of decreased platelets or bleeding - bruising, pinpoint red spots on the skin, black, tarry stools, nosebleeds signs of decreased red blood cells - unusually weak or tired, fainting spells, lightheadedness breathing problems changes in hearing changes in vision chest pain high blood pressure low blood counts - This drug may decrease the number of white blood cells, red blood cells and platelets. You may be at increased risk for infections and bleeding. nausea and vomiting pain, swelling, redness or irritation at the injection site pain, tingling, numbness in the hands or feet problems with balance, talking, walking trouble passing urine or change in the amount of urine Side effects that usually do not require medical attention (report to your doctor or health care professional if they continue or are bothersome): hair loss loss of appetite metallic taste in the mouth or changes in taste This list may not describe all possible side effects. Call your doctor for medical advice about side effects. You may report side effects to FDA at 1-800-FDA-1088. Where should I keep my medication? This drug is given in a hospital or clinic and will not be stored at home. NOTE: This sheet is a summary. It may not cover all possible information. If you have questions about this medicine, talk to your doctor, pharmacist, or health care provider.  2022 Elsevier/Gold Standard (2007-04-21 14:38:05)  Etoposide, VP-16 injection What is this medication? ETOPOSIDE, VP-16 (e toe POE side) is a chemotherapy drug. It is used to treat testicular cancer, lung cancer, and other cancers. This medicine may be used for other purposes; ask your health care provider or pharmacist if you have questions. COMMON BRAND NAME(S): Etopophos, Toposar, VePesid What should I tell my care team before I take this  medication? They need to know if you have any of these conditions: infection kidney disease liver disease low blood counts, like low white cell, platelet, or red cell counts an unusual or allergic reaction to etoposide, other medicines, foods, dyes, or preservatives pregnant or trying to get pregnant breast-feeding How should I use this medication? This medicine is for infusion into a vein. It is administered in a hospital or clinic by a specially trained health care professional. Talk to your pediatrician regarding the use of this medicine in children. Special care may be needed. Overdosage: If you think you have taken too much of this medicine contact a poison control center or emergency room at once. NOTE: This medicine is only for you. Do not share this medicine with others. What if I miss a dose? It is important not to miss your dose. Call your doctor or health care professional if you are unable to keep an appointment. What may interact with this medication? This medicine may interact with the following medications:  warfarin This list may not describe all possible interactions. Give your health care provider a list of all the medicines, herbs, non-prescription drugs, or dietary supplements you use. Also tell them if you smoke, drink alcohol, or use illegal drugs. Some items may interact with your medicine. What should I watch for while using this medication? Visit your doctor for checks on your progress. This drug may make you feel generally unwell. This is not uncommon, as chemotherapy can affect healthy cells as well as cancer cells. Report any side effects. Continue your course of treatment even though you feel ill unless your doctor tells you to stop. In some cases, you may be given additional medicines to help with side effects. Follow all directions for their use. Call your doctor or health care professional for advice if you get a fever, chills or sore throat, or other symptoms of  a cold or flu. Do not treat yourself. This drug decreases your body's ability to fight infections. Try to avoid being around people who are sick. This medicine may increase your risk to bruise or bleed. Call your doctor or health care professional if you notice any unusual bleeding. Talk to your doctor about your risk of cancer. You may be more at risk for certain types of cancers if you take this medicine. Do not become pregnant while taking this medicine or for at least 6 months after stopping it. Women should inform their doctor if they wish to become pregnant or think they might be pregnant. Women of child-bearing potential will need to have a negative pregnancy test before starting this medicine. There is a potential for serious side effects to an unborn child. Talk to your health care professional or pharmacist for more information. Do not breast-feed an infant while taking this medicine. Men must use a latex condom during sexual contact with a woman while taking this medicine and for at least 4 months after stopping it. A latex condom is needed even if you have had a vasectomy. Contact your doctor right away if your partner becomes pregnant. Do not donate sperm while taking this medicine and for at least 4 months after you stop taking this medicine. Men should inform their doctors if they wish to father a child. This medicine may lower sperm counts. What side effects may I notice from receiving this medication? Side effects that you should report to your doctor or health care professional as soon as possible: allergic reactions like skin rash, itching or hives, swelling of the face, lips, or tongue low blood counts - this medicine may decrease the number of white blood cells, red blood cells, and platelets. You may be at increased risk for infections and bleeding nausea, vomiting redness, blistering, peeling or loosening of the skin, including inside the mouth signs and symptoms of infection like  fever; chills; cough; sore throat; pain or trouble passing urine signs and symptoms of low red blood cells or anemia such as unusually weak or tired; feeling faint or lightheaded; falls; breathing problems unusual bruising or bleeding Side effects that usually do not require medical attention (report to your doctor or health care professional if they continue or are bothersome): changes in taste diarrhea hair loss loss of appetite mouth sores This list may not describe all possible side effects. Call your doctor for medical advice about side effects. You may report side effects to FDA at 1-800-FDA-1088. Where should I keep my medication? This drug is given in a hospital or clinic and will not  be stored at home. NOTE: This sheet is a summary. It may not cover all possible information. If you have questions about this medicine, talk to your doctor, pharmacist, or health care provider.  2022 Elsevier/Gold Standard (2018-03-11 16:57:15)

## 2020-11-14 ENCOUNTER — Inpatient Hospital Stay: Payer: Medicare HMO

## 2020-11-14 ENCOUNTER — Encounter: Payer: Self-pay | Admitting: General Practice

## 2020-11-14 VITALS — BP 150/81 | HR 81 | Temp 98.2°F | Resp 18 | Wt 209.8 lb

## 2020-11-14 DIAGNOSIS — Z23 Encounter for immunization: Secondary | ICD-10-CM | POA: Diagnosis not present

## 2020-11-14 DIAGNOSIS — C3490 Malignant neoplasm of unspecified part of unspecified bronchus or lung: Secondary | ICD-10-CM

## 2020-11-14 DIAGNOSIS — Z5111 Encounter for antineoplastic chemotherapy: Secondary | ICD-10-CM | POA: Diagnosis not present

## 2020-11-14 DIAGNOSIS — E785 Hyperlipidemia, unspecified: Secondary | ICD-10-CM | POA: Diagnosis not present

## 2020-11-14 DIAGNOSIS — Z5112 Encounter for antineoplastic immunotherapy: Secondary | ICD-10-CM | POA: Diagnosis not present

## 2020-11-14 DIAGNOSIS — Z5189 Encounter for other specified aftercare: Secondary | ICD-10-CM | POA: Diagnosis not present

## 2020-11-14 DIAGNOSIS — E039 Hypothyroidism, unspecified: Secondary | ICD-10-CM | POA: Diagnosis not present

## 2020-11-14 DIAGNOSIS — C3431 Malignant neoplasm of lower lobe, right bronchus or lung: Secondary | ICD-10-CM | POA: Diagnosis not present

## 2020-11-14 DIAGNOSIS — M1990 Unspecified osteoarthritis, unspecified site: Secondary | ICD-10-CM | POA: Diagnosis not present

## 2020-11-14 DIAGNOSIS — E119 Type 2 diabetes mellitus without complications: Secondary | ICD-10-CM | POA: Diagnosis not present

## 2020-11-14 DIAGNOSIS — R69 Illness, unspecified: Secondary | ICD-10-CM | POA: Diagnosis not present

## 2020-11-14 MED ORDER — SODIUM CHLORIDE 0.9 % IV SOLN: Freq: Once | INTRAVENOUS | Status: AC

## 2020-11-14 MED ORDER — SODIUM CHLORIDE 0.9 % IV SOLN
10.0000 mg | Freq: Once | INTRAVENOUS | Status: AC
  Administered 2020-11-14: 10 mg via INTRAVENOUS
  Filled 2020-11-14: qty 10

## 2020-11-14 MED ORDER — HEPARIN SOD (PORK) LOCK FLUSH 100 UNIT/ML IV SOLN
500.0000 [IU] | Freq: Once | INTRAVENOUS | Status: AC | PRN
  Administered 2020-11-14: 500 [IU]

## 2020-11-14 MED ORDER — SODIUM CHLORIDE 0.9 % IV SOLN
100.0000 mg/m2 | Freq: Once | INTRAVENOUS | Status: AC
  Administered 2020-11-14: 200 mg via INTRAVENOUS
  Filled 2020-11-14: qty 10

## 2020-11-14 MED ORDER — SODIUM CHLORIDE 0.9% FLUSH
10.0000 mL | INTRAVENOUS | Status: DC | PRN
  Administered 2020-11-14: 10 mL

## 2020-11-14 MED FILL — Dexamethasone Sodium Phosphate Inj 100 MG/10ML: INTRAMUSCULAR | Qty: 1 | Status: AC

## 2020-11-14 NOTE — Patient Instructions (Signed)
[facility]  Discharge Instructions: Thank you for choosing [facility] to provide your oncology and hematology care.   If you have a lab appointment with the [facility], please go directly to the [facility] and check in at the registration area.   Wear comfortable clothing and clothing appropriate for easy access to any Portacath or PICC line.   We strive to give you quality time with your provider. You may need to reschedule your appointment if you arrive late (15 or more minutes).  Arriving late affects you and other patients whose appointments are after yours.  Also, if you miss three or more appointments without notifying the office, you may be dismissed from the clinic at the provider's discretion.      For prescription refill requests, have your pharmacy contact our office and allow 72 hours for refills to be completed.    Today you received the following chemotherapy and/or immunotherapy agents Day 2 Etoposide      To help prevent nausea and vomiting after your treatment, we encourage you to take your nausea medication as directed.  BELOW ARE SYMPTOMS THAT SHOULD BE REPORTED IMMEDIATELY: *FEVER GREATER THAN 100.4 F (38 C) OR HIGHER *CHILLS OR SWEATING *NAUSEA AND VOMITING THAT IS NOT CONTROLLED WITH YOUR NAUSEA MEDICATION *UNUSUAL SHORTNESS OF BREATH *UNUSUAL BRUISING OR BLEEDING *URINARY PROBLEMS (pain or burning when urinating, or frequent urination) *BOWEL PROBLEMS (unusual diarrhea, constipation, pain near the anus) TENDERNESS IN MOUTH AND THROAT WITH OR WITHOUT PRESENCE OF ULCERS (sore throat, sores in mouth, or a toothache) UNUSUAL RASH, SWELLING OR PAIN  UNUSUAL VAGINAL DISCHARGE OR ITCHING   Items with * indicate a potential emergency and should be followed up as soon as possible or go to the Emergency Department if any problems should occur.  Please show the CHEMOTHERAPY ALERT CARD or IMMUNOTHERAPY ALERT CARD at  check-in to the Emergency Department and triage nurse.  Should you have questions after your visit or need to cancel or reschedule your appointment, please contact [facility]  Dept: [phone]  and follow the prompts.  Office hours are 8:00 a.m. to 4:30 p.m. Monday - Friday. Please note that voicemails left after 4:00 p.m. may not be returned until the following business day.  We are closed weekends and major holidays. You have access to a nurse at all times for urgent questions. Please call the main number to the clinic Dept: [phone] and follow the prompts.   For any non-urgent questions, you may also contact your provider using MyChart. We now offer e-Visits for anyone 10 and older to request care online for non-urgent symptoms. For details visit [email or web address].   Also download the MyChart app! Go to the app store, search "MyChart", open the app, select [facility], and log in with your MyChart username and password.  Due to Covid, a mask is required upon entering the hospital/clinic. If you do not have a mask, one will be given to you upon arrival. For doctor visits, patients may have 1 support person aged 27 or older with them. For treatment visits, patients cannot have anyone with them due to current Covid guidelines and our immunocompromised population.

## 2020-11-14 NOTE — Progress Notes (Signed)
[facility] Spiritual Care Note  Referred by Apolonio Schneiders Perkins/RN for emotional support, as Ms Boothe appeared to benefit from having someone to talk to. Ms Nearhood was very receptive of Spiritual Care and shared readily about grief, family concerns, her beloved bloodhound's supportive presence, and other matters on her mind.  Infusion is a place where she feels free to connect, so we plan to follow up at a future treatment. She also has my direct-dial number and a full packet of [facility] support team and programming information, including details about Lung Cancer Support Group.   [place], [place], [facility] Pager [phone] Voicemail [phone]

## 2020-11-15 ENCOUNTER — Ambulatory Visit: Payer: Medicare HMO

## 2020-11-15 ENCOUNTER — Ambulatory Visit: Payer: Medicare HMO | Admitting: Hematology and Oncology

## 2020-11-15 ENCOUNTER — Telehealth: Payer: Self-pay | Admitting: Hematology and Oncology

## 2020-11-15 ENCOUNTER — Inpatient Hospital Stay: Payer: Medicare HMO

## 2020-11-15 ENCOUNTER — Other Ambulatory Visit: Payer: Self-pay

## 2020-11-15 ENCOUNTER — Other Ambulatory Visit: Payer: Medicare HMO

## 2020-11-15 VITALS — BP 151/73 | HR 78 | Temp 98.3°F | Resp 16

## 2020-11-15 DIAGNOSIS — E119 Type 2 diabetes mellitus without complications: Secondary | ICD-10-CM | POA: Diagnosis not present

## 2020-11-15 DIAGNOSIS — Z5189 Encounter for other specified aftercare: Secondary | ICD-10-CM | POA: Diagnosis not present

## 2020-11-15 DIAGNOSIS — R69 Illness, unspecified: Secondary | ICD-10-CM | POA: Diagnosis not present

## 2020-11-15 DIAGNOSIS — C3431 Malignant neoplasm of lower lobe, right bronchus or lung: Secondary | ICD-10-CM | POA: Diagnosis not present

## 2020-11-15 DIAGNOSIS — Z5111 Encounter for antineoplastic chemotherapy: Secondary | ICD-10-CM | POA: Diagnosis not present

## 2020-11-15 DIAGNOSIS — Z23 Encounter for immunization: Secondary | ICD-10-CM | POA: Diagnosis not present

## 2020-11-15 DIAGNOSIS — E039 Hypothyroidism, unspecified: Secondary | ICD-10-CM | POA: Diagnosis not present

## 2020-11-15 DIAGNOSIS — E785 Hyperlipidemia, unspecified: Secondary | ICD-10-CM | POA: Diagnosis not present

## 2020-11-15 DIAGNOSIS — C3490 Malignant neoplasm of unspecified part of unspecified bronchus or lung: Secondary | ICD-10-CM

## 2020-11-15 DIAGNOSIS — M1990 Unspecified osteoarthritis, unspecified site: Secondary | ICD-10-CM | POA: Diagnosis not present

## 2020-11-15 DIAGNOSIS — Z5112 Encounter for antineoplastic immunotherapy: Secondary | ICD-10-CM | POA: Diagnosis not present

## 2020-11-15 MED ORDER — HEPARIN SOD (PORK) LOCK FLUSH 100 UNIT/ML IV SOLN
500.0000 [IU] | Freq: Once | INTRAVENOUS | Status: AC | PRN
  Administered 2020-11-15: 500 [IU]

## 2020-11-15 MED ORDER — SODIUM CHLORIDE 0.9 % IV SOLN: Freq: Once | INTRAVENOUS | Status: AC

## 2020-11-15 MED ORDER — SODIUM CHLORIDE 0.9% FLUSH
10.0000 mL | INTRAVENOUS | Status: DC | PRN
  Administered 2020-11-15: 10 mL

## 2020-11-15 MED ORDER — SODIUM CHLORIDE 0.9 % IV SOLN
100.0000 mg/m2 | Freq: Once | INTRAVENOUS | Status: AC
  Administered 2020-11-15: 200 mg via INTRAVENOUS
  Filled 2020-11-15: qty 10

## 2020-11-15 MED ORDER — INFLUENZA VAC SPLIT QUAD 0.5 ML IM SUSY
0.5000 mL | PREFILLED_SYRINGE | Freq: Once | INTRAMUSCULAR | Status: AC
  Administered 2020-11-15: 0.5 mL via INTRAMUSCULAR
  Filled 2020-11-15: qty 0.5

## 2020-11-15 MED ORDER — SODIUM CHLORIDE 0.9 % IV SOLN
10.0000 mg | Freq: Once | INTRAVENOUS | Status: AC
  Administered 2020-11-15: 10 mg via INTRAVENOUS
  Filled 2020-11-15: qty 10

## 2020-11-15 NOTE — Patient Instructions (Signed)
[facility]  Discharge Instructions: Thank you for choosing [facility] to provide your oncology and hematology care.   If you have a lab appointment with the Cancer Center, please go directly to the Cancer Center and check in at the registration area.   Wear comfortable clothing and clothing appropriate for easy access to any Portacath or PICC line.   We strive to give you quality time with your provider. You may need to reschedule your appointment if you arrive late (15 or more minutes).  Arriving late affects you and other patients whose appointments are after yours.  Also, if you miss three or more appointments without notifying the office, you may be dismissed from the clinic at the provider's discretion.      For prescription refill requests, have your pharmacy contact our office and allow 72 hours for refills to be completed.    Today you received the following chemotherapy and/or immunotherapy agents: etoposide.     To help prevent nausea and vomiting after your treatment, we encourage you to take your nausea medication as directed.  BELOW ARE SYMPTOMS THAT SHOULD BE REPORTED IMMEDIATELY: . *FEVER GREATER THAN 100.4 F (38 C) OR HIGHER . *CHILLS OR SWEATING . *NAUSEA AND VOMITING THAT IS NOT CONTROLLED WITH YOUR NAUSEA MEDICATION . *UNUSUAL SHORTNESS OF BREATH . *UNUSUAL BRUISING OR BLEEDING . *URINARY PROBLEMS (pain or burning when urinating, or frequent urination) . *BOWEL PROBLEMS (unusual diarrhea, constipation, pain near the anus) . TENDERNESS IN MOUTH AND THROAT WITH OR WITHOUT PRESENCE OF ULCERS (sore throat, sores in mouth, or a toothache) . UNUSUAL RASH, SWELLING OR PAIN  . UNUSUAL VAGINAL DISCHARGE OR ITCHING   Items with * indicate a potential emergency and should be followed up as soon as possible or go to the Emergency Department if any problems should occur.  Please show the CHEMOTHERAPY ALERT CARD or IMMUNOTHERAPY ALERT  CARD at check-in to the Emergency Department and triage nurse.  Should you have questions after your visit or need to cancel or reschedule your appointment, please contact [facility]  Dept: [phone]  and follow the prompts.  Office hours are 8:00 a.m. to 4:30 p.m. Monday - Friday. Please note that voicemails left after 4:00 p.m. may not be returned until the following business day.  We are closed weekends and major holidays. You have access to a nurse at all times for urgent questions. Please call the main number to the clinic Dept: [phone] and follow the prompts.   For any non-urgent questions, you may also contact your provider using MyChart. We now offer e-Visits for anyone 18 and older to request care online for non-urgent symptoms. For details visit [email or web address].   Also download the MyChart app! Go to the app store, search "MyChart", open the app, select , and log in with your MyChart username and password.  Due to Covid, a mask is required upon entering the hospital/clinic. If you do not have a mask, one will be given to you upon arrival. For doctor visits, patients may have 1 support person aged 18 or older with them. For treatment visits, patients cannot have anyone with them due to current Covid guidelines and our immunocompromised population.   

## 2020-11-15 NOTE — Telephone Encounter (Signed)
Scheduled per 10/17 los, patient has been called and voicemail was left.

## 2020-11-16 ENCOUNTER — Ambulatory Visit: Payer: Medicare HMO

## 2020-11-17 ENCOUNTER — Other Ambulatory Visit: Payer: Self-pay

## 2020-11-17 ENCOUNTER — Ambulatory Visit: Payer: Medicare HMO | Admitting: Cardiovascular Disease

## 2020-11-17 ENCOUNTER — Inpatient Hospital Stay: Payer: Medicare HMO

## 2020-11-17 ENCOUNTER — Telehealth: Payer: Self-pay | Admitting: Physical Medicine and Rehabilitation

## 2020-11-17 ENCOUNTER — Encounter: Payer: Medicare HMO | Admitting: Physical Medicine and Rehabilitation

## 2020-11-17 ENCOUNTER — Ambulatory Visit: Payer: Medicare HMO

## 2020-11-17 VITALS — BP 133/73 | HR 83 | Temp 99.0°F | Resp 20

## 2020-11-17 DIAGNOSIS — E785 Hyperlipidemia, unspecified: Secondary | ICD-10-CM | POA: Diagnosis not present

## 2020-11-17 DIAGNOSIS — Z5111 Encounter for antineoplastic chemotherapy: Secondary | ICD-10-CM | POA: Diagnosis not present

## 2020-11-17 DIAGNOSIS — E039 Hypothyroidism, unspecified: Secondary | ICD-10-CM | POA: Diagnosis not present

## 2020-11-17 DIAGNOSIS — C3431 Malignant neoplasm of lower lobe, right bronchus or lung: Secondary | ICD-10-CM | POA: Diagnosis not present

## 2020-11-17 DIAGNOSIS — R69 Illness, unspecified: Secondary | ICD-10-CM | POA: Diagnosis not present

## 2020-11-17 DIAGNOSIS — Z5112 Encounter for antineoplastic immunotherapy: Secondary | ICD-10-CM | POA: Diagnosis not present

## 2020-11-17 DIAGNOSIS — Z23 Encounter for immunization: Secondary | ICD-10-CM | POA: Diagnosis not present

## 2020-11-17 DIAGNOSIS — M1990 Unspecified osteoarthritis, unspecified site: Secondary | ICD-10-CM | POA: Diagnosis not present

## 2020-11-17 DIAGNOSIS — Z5189 Encounter for other specified aftercare: Secondary | ICD-10-CM | POA: Diagnosis not present

## 2020-11-17 DIAGNOSIS — C3490 Malignant neoplasm of unspecified part of unspecified bronchus or lung: Secondary | ICD-10-CM

## 2020-11-17 DIAGNOSIS — E119 Type 2 diabetes mellitus without complications: Secondary | ICD-10-CM | POA: Diagnosis not present

## 2020-11-17 MED ORDER — PEGFILGRASTIM-CBQV 6 MG/0.6ML ~~LOC~~ SOSY
6.0000 mg | PREFILLED_SYRINGE | Freq: Once | SUBCUTANEOUS | Status: AC
  Administered 2020-11-17: 6 mg via SUBCUTANEOUS
  Filled 2020-11-17: qty 0.6

## 2020-11-17 NOTE — Patient Instructions (Signed)

## 2020-11-17 NOTE — Telephone Encounter (Signed)
We had to reschedule her appointment due to family emergency.  Patient would like to speak to clinical staff about some concerns.

## 2020-11-18 ENCOUNTER — Ambulatory Visit: Payer: Medicare HMO

## 2020-11-20 ENCOUNTER — Telehealth: Payer: Medicare HMO

## 2020-11-24 ENCOUNTER — Other Ambulatory Visit: Payer: Self-pay | Admitting: Physician Assistant

## 2020-11-24 ENCOUNTER — Encounter: Payer: Self-pay | Admitting: Hematology and Oncology

## 2020-11-24 ENCOUNTER — Encounter: Payer: Self-pay | Admitting: Physician Assistant

## 2020-11-24 MED ORDER — GENTAMICIN SULFATE 0.3 % OP SOLN: 2.0000 [drp] | Freq: Four times a day (QID) | OPHTHALMIC | 0 refills | Status: AC

## 2020-11-27 ENCOUNTER — Encounter: Payer: Self-pay | Admitting: Physician Assistant

## 2020-11-27 ENCOUNTER — Telehealth (INDEPENDENT_AMBULATORY_CARE_PROVIDER_SITE_OTHER): Payer: Medicare HMO | Admitting: Physician Assistant

## 2020-11-27 DIAGNOSIS — F32A Depression, unspecified: Secondary | ICD-10-CM | POA: Diagnosis not present

## 2020-11-27 DIAGNOSIS — R69 Illness, unspecified: Secondary | ICD-10-CM | POA: Diagnosis not present

## 2020-11-27 DIAGNOSIS — L03213 Periorbital cellulitis: Secondary | ICD-10-CM

## 2020-11-27 DIAGNOSIS — F419 Anxiety disorder, unspecified: Secondary | ICD-10-CM

## 2020-11-27 DIAGNOSIS — C3490 Malignant neoplasm of unspecified part of unspecified bronchus or lung: Secondary | ICD-10-CM

## 2020-11-27 DIAGNOSIS — G4734 Idiopathic sleep related nonobstructive alveolar hypoventilation: Secondary | ICD-10-CM | POA: Insufficient documentation

## 2020-11-27 MED ORDER — CEFPROZIL 500 MG PO TABS: 500.0000 mg | ORAL_TABLET | Freq: Two times a day (BID) | ORAL | 0 refills | Status: DC

## 2020-11-27 MED ORDER — ALPRAZOLAM 1 MG PO TABS: ORAL_TABLET | ORAL | 2 refills | Status: DC

## 2020-11-27 NOTE — Progress Notes (Signed)
Virtual Visit via Video Note  I connected with  [name]  on 11/27/20 at 11:30 AM EDT by a video enabled telemedicine application and verified that I am speaking with the correct [name] using two identifiers.  Location: Patient: home Provider: Therapist, music at [place] present: Patient and myself   I discussed the limitations of evaluation and management by telemedicine and the availability of in [name] appointments. The patient expressed understanding and agreed to proceed.   History of Present Illness: "Pink eye" x 1 week. Symptoms started 11/21/20 with itchy and oozing. Now having some swelling around the left eye. Vision is OK.  No fever. No sick contacts. She has tried gentamicin drops without relief. Also tried OTC drops, which made symptoms worse.   Currently undergoing chemo treatment for small cell lung cancer. Pt requesting increase in her Xanax, currently taking 1 mg BID and has previously taken 1 mg TID to QID as she has been on this for "years." She states her anxiety is "through the roof" with all she is going through right now.   Observations/Objective:   Gen: Awake, alert, no acute distress Eyes: EOM intact. Left preseptal cellulitis present.  Resp: Breathing is even and non-labored Psych: calm/pleasant demeanor Neuro: Alert and Oriented x 3, + facial symmetry, speech is clear.   Assessment and Plan:  1. Preseptal cellulitis of left eye -Does not appear to be orbital cellulitis, but provided red flags to watch for.  -Treat with cefprozil 500 mg bid x 7 days -Warm compresses -F/up with eye doctor  2. Anxiety and depression 3. Small cell lung cancer (HCC) -Worsening anxiety 2/2 current chemotherapy treatment -PDMP reviewed today, no red flags, filling appropriately.  -Increase Xanax to 1 mg TID  -She will f/up with me in Dec as scheduled.   Follow Up Instructions:    I discussed the assessment and treatment plan with the  patient. The patient was provided an opportunity to ask questions and all were answered. The patient agreed with the plan and demonstrated an understanding of the instructions.   The patient was advised to call back or seek an in-[name] evaluation if the symptoms worsen or if the condition fails to improve as anticipated.  Gino Garrabrant M Regino Fournet, PA-C

## 2020-11-28 DIAGNOSIS — H0288A Meibomian gland dysfunction right eye, upper and lower eyelids: Secondary | ICD-10-CM | POA: Diagnosis not present

## 2020-11-28 DIAGNOSIS — H0288B Meibomian gland dysfunction left eye, upper and lower eyelids: Secondary | ICD-10-CM | POA: Diagnosis not present

## 2020-11-28 DIAGNOSIS — H00024 Hordeolum internum left upper eyelid: Secondary | ICD-10-CM | POA: Diagnosis not present

## 2020-11-29 DIAGNOSIS — J449 Chronic obstructive pulmonary disease, unspecified: Secondary | ICD-10-CM | POA: Diagnosis not present

## 2020-11-30 ENCOUNTER — Telehealth: Payer: Self-pay | Admitting: *Deleted

## 2020-11-30 ENCOUNTER — Other Ambulatory Visit: Payer: Self-pay | Admitting: Physical Medicine and Rehabilitation

## 2020-11-30 DIAGNOSIS — G894 Chronic pain syndrome: Secondary | ICD-10-CM

## 2020-11-30 NOTE — Telephone Encounter (Signed)
Refill request for Hydrocodone.  

## 2020-11-30 NOTE — Telephone Encounter (Signed)
Received call from patient. She sates she has an ongoing eye infection, perhaps a blocked tear duct.  She has seen her PCP and her eye doctor. She is on antibiotics and warm compresses. She states it is not much better.  Her main question is if she should have chemo on Monday, 12/04/20  Advised that I would check with Dr. Lorenso Courier and call her back. Also advised to keep following up with her eye doctor and PCP.  Dr. Lorenso Courier advised that he will see her on Monday and then decide if she can get chemo that day. Pt made aware.

## 2020-12-04 ENCOUNTER — Other Ambulatory Visit: Payer: Self-pay | Admitting: Nurse Practitioner

## 2020-12-04 ENCOUNTER — Inpatient Hospital Stay: Payer: Medicare HMO

## 2020-12-04 ENCOUNTER — Other Ambulatory Visit: Payer: Self-pay | Admitting: Hematology and Oncology

## 2020-12-04 ENCOUNTER — Other Ambulatory Visit: Payer: Self-pay

## 2020-12-04 ENCOUNTER — Inpatient Hospital Stay: Payer: Medicare HMO | Attending: Hematology and Oncology

## 2020-12-04 ENCOUNTER — Inpatient Hospital Stay (HOSPITAL_BASED_OUTPATIENT_CLINIC_OR_DEPARTMENT_OTHER): Payer: Medicare HMO | Admitting: Hematology and Oncology

## 2020-12-04 VITALS — BP 133/77 | HR 89 | Resp 18

## 2020-12-04 VITALS — BP 159/90 | HR 104 | Temp 97.9°F | Resp 18 | Wt 212.6 lb

## 2020-12-04 DIAGNOSIS — Z814 Family history of other substance abuse and dependence: Secondary | ICD-10-CM | POA: Insufficient documentation

## 2020-12-04 DIAGNOSIS — Z823 Family history of stroke: Secondary | ICD-10-CM | POA: Diagnosis not present

## 2020-12-04 DIAGNOSIS — Z5189 Encounter for other specified aftercare: Secondary | ICD-10-CM | POA: Insufficient documentation

## 2020-12-04 DIAGNOSIS — Z8249 Family history of ischemic heart disease and other diseases of the circulatory system: Secondary | ICD-10-CM | POA: Insufficient documentation

## 2020-12-04 DIAGNOSIS — C3431 Malignant neoplasm of lower lobe, right bronchus or lung: Secondary | ICD-10-CM | POA: Insufficient documentation

## 2020-12-04 DIAGNOSIS — Z95828 Presence of other vascular implants and grafts: Secondary | ICD-10-CM

## 2020-12-04 DIAGNOSIS — Z836 Family history of other diseases of the respiratory system: Secondary | ICD-10-CM | POA: Insufficient documentation

## 2020-12-04 DIAGNOSIS — Z8261 Family history of arthritis: Secondary | ICD-10-CM | POA: Insufficient documentation

## 2020-12-04 DIAGNOSIS — Z79899 Other long term (current) drug therapy: Secondary | ICD-10-CM | POA: Insufficient documentation

## 2020-12-04 DIAGNOSIS — Z5112 Encounter for antineoplastic immunotherapy: Secondary | ICD-10-CM | POA: Insufficient documentation

## 2020-12-04 DIAGNOSIS — I10 Essential (primary) hypertension: Secondary | ICD-10-CM | POA: Diagnosis not present

## 2020-12-04 DIAGNOSIS — R109 Unspecified abdominal pain: Secondary | ICD-10-CM | POA: Diagnosis not present

## 2020-12-04 DIAGNOSIS — D72829 Elevated white blood cell count, unspecified: Secondary | ICD-10-CM | POA: Diagnosis not present

## 2020-12-04 DIAGNOSIS — C3490 Malignant neoplasm of unspecified part of unspecified bronchus or lung: Secondary | ICD-10-CM | POA: Diagnosis not present

## 2020-12-04 DIAGNOSIS — Z5111 Encounter for antineoplastic chemotherapy: Secondary | ICD-10-CM | POA: Insufficient documentation

## 2020-12-04 DIAGNOSIS — Z809 Family history of malignant neoplasm, unspecified: Secondary | ICD-10-CM | POA: Diagnosis not present

## 2020-12-04 DIAGNOSIS — F17210 Nicotine dependence, cigarettes, uncomplicated: Secondary | ICD-10-CM | POA: Insufficient documentation

## 2020-12-04 DIAGNOSIS — E785 Hyperlipidemia, unspecified: Secondary | ICD-10-CM | POA: Diagnosis not present

## 2020-12-04 DIAGNOSIS — R5383 Other fatigue: Secondary | ICD-10-CM | POA: Insufficient documentation

## 2020-12-04 DIAGNOSIS — Z820 Family history of epilepsy and other diseases of the nervous system: Secondary | ICD-10-CM | POA: Insufficient documentation

## 2020-12-04 DIAGNOSIS — R69 Illness, unspecified: Secondary | ICD-10-CM | POA: Diagnosis not present

## 2020-12-04 LAB — CBC WITH DIFFERENTIAL (CANCER CENTER ONLY)
Abs Immature Granulocytes: 1.52 10*3/uL — ABNORMAL HIGH (ref 0.00–0.07)
Basophils Absolute: 0.1 10*3/uL (ref 0.0–0.1)
Basophils Relative: 1 %
Eosinophils Absolute: 0.1 10*3/uL (ref 0.0–0.5)
Eosinophils Relative: 0 %
HCT: 36.4 % (ref 36.0–46.0)
Hemoglobin: 11.8 g/dL — ABNORMAL LOW (ref 12.0–15.0)
Immature Granulocytes: 10 %
Lymphocytes Relative: 19 %
Lymphs Abs: 2.9 10*3/uL (ref 0.7–4.0)
MCH: 26.9 pg (ref 26.0–34.0)
MCHC: 32.4 g/dL (ref 30.0–36.0)
MCV: 82.9 fL (ref 80.0–100.0)
Monocytes Absolute: 1.1 10*3/uL — ABNORMAL HIGH (ref 0.1–1.0)
Monocytes Relative: 7 %
Neutro Abs: 9.9 10*3/uL — ABNORMAL HIGH (ref 1.7–7.7)
Neutrophils Relative %: 63 %
Platelet Count: 326 10*3/uL (ref 150–400)
RBC: 4.39 MIL/uL (ref 3.87–5.11)
RDW: 18.3 % — ABNORMAL HIGH (ref 11.5–15.5)
WBC Count: 15.7 10*3/uL — ABNORMAL HIGH (ref 4.0–10.5)
nRBC: 0.5 % — ABNORMAL HIGH (ref 0.0–0.2)

## 2020-12-04 LAB — CMP (CANCER CENTER ONLY)
ALT: 24 U/L (ref 0–44)
AST: 23 U/L (ref 15–41)
Albumin: 3.1 g/dL — ABNORMAL LOW (ref 3.5–5.0)
Alkaline Phosphatase: 107 U/L (ref 38–126)
Anion gap: 7 (ref 5–15)
BUN: 6 mg/dL — ABNORMAL LOW (ref 8–23)
CO2: 27 mmol/L (ref 22–32)
Calcium: 8.3 mg/dL — ABNORMAL LOW (ref 8.9–10.3)
Chloride: 101 mmol/L (ref 98–111)
Creatinine: 0.69 mg/dL (ref 0.44–1.00)
GFR, Estimated: >60 mL/min (ref >60)
Glucose, Bld: 103 mg/dL — ABNORMAL HIGH (ref 70–99)
Potassium: 3.8 mmol/L (ref 3.5–5.1)
Sodium: 135 mmol/L (ref 135–145)
Total Bilirubin: 0.3 mg/dL (ref 0.3–1.2)
Total Protein: 6.4 g/dL — ABNORMAL LOW (ref 6.5–8.1)

## 2020-12-04 LAB — TSH: TSH: 0.222 u[IU]/mL — ABNORMAL LOW (ref 0.308–3.960)

## 2020-12-04 MED ORDER — SODIUM CHLORIDE 0.9 % IV SOLN
150.0000 mg | Freq: Once | INTRAVENOUS | Status: AC
  Administered 2020-12-04: 150 mg via INTRAVENOUS
  Filled 2020-12-04: qty 150

## 2020-12-04 MED ORDER — SODIUM CHLORIDE 0.9% FLUSH
10.0000 mL | INTRAVENOUS | Status: DC | PRN
  Administered 2020-12-04: 10 mL

## 2020-12-04 MED ORDER — SODIUM CHLORIDE 0.9 % IV SOLN
1200.0000 mg | Freq: Once | INTRAVENOUS | Status: AC
  Administered 2020-12-04: 1200 mg via INTRAVENOUS
  Filled 2020-12-04: qty 20

## 2020-12-04 MED ORDER — SODIUM CHLORIDE 0.9 % IV SOLN
100.0000 mg/m2 | Freq: Once | INTRAVENOUS | Status: AC
  Administered 2020-12-04: 200 mg via INTRAVENOUS
  Filled 2020-12-04: qty 10

## 2020-12-04 MED ORDER — SODIUM CHLORIDE 0.9 % IV SOLN
650.0000 mg | Freq: Once | INTRAVENOUS | Status: AC
  Administered 2020-12-04: 650 mg via INTRAVENOUS
  Filled 2020-12-04: qty 65

## 2020-12-04 MED ORDER — SODIUM CHLORIDE 0.9 % IV SOLN
10.0000 mg | Freq: Once | INTRAVENOUS | Status: AC
  Administered 2020-12-04: 10 mg via INTRAVENOUS
  Filled 2020-12-04: qty 10

## 2020-12-04 MED ORDER — PALONOSETRON HCL INJECTION 0.25 MG/5ML
0.2500 mg | Freq: Once | INTRAVENOUS | Status: AC
  Administered 2020-12-04: 0.25 mg via INTRAVENOUS
  Filled 2020-12-04: qty 5

## 2020-12-04 MED ORDER — HEPARIN SOD (PORK) LOCK FLUSH 100 UNIT/ML IV SOLN
500.0000 [IU] | Freq: Once | INTRAVENOUS | Status: AC | PRN
  Administered 2020-12-04: 500 [IU]

## 2020-12-04 MED ORDER — SODIUM CHLORIDE 0.9 % IV SOLN
Freq: Once | INTRAVENOUS | Status: AC
Start: 2020-12-04 — End: 2020-12-04

## 2020-12-04 NOTE — Patient Instructions (Signed)

## 2020-12-04 NOTE — Patient Instructions (Signed)
[facility]  Discharge Instructions: Thank you for choosing [facility] to provide your oncology and hematology care.   If you have a lab appointment with the [facility], please go directly to the [facility] and check in at the registration area.   Wear comfortable clothing and clothing appropriate for easy access to any Portacath or PICC line.   We strive to give you quality time with your provider. You may need to reschedule your appointment if you arrive late (15 or more minutes).  Arriving late affects you and other patients whose appointments are after yours.  Also, if you miss three or more appointments without notifying the office, you may be dismissed from the clinic at the provider's discretion.      For prescription refill requests, have your pharmacy contact our office and allow 72 hours for refills to be completed.    Today you received the following chemotherapy and/or immunotherapy agents: Tecentriq, Carboplatin, & Etoposide    To help prevent nausea and vomiting after your treatment, we encourage you to take your nausea medication as directed.  BELOW ARE SYMPTOMS THAT SHOULD BE REPORTED IMMEDIATELY: *FEVER GREATER THAN 100.4 F (38 C) OR HIGHER *CHILLS OR SWEATING *NAUSEA AND VOMITING THAT IS NOT CONTROLLED WITH YOUR NAUSEA MEDICATION *UNUSUAL SHORTNESS OF BREATH *UNUSUAL BRUISING OR BLEEDING *URINARY PROBLEMS (pain or burning when urinating, or frequent urination) *BOWEL PROBLEMS (unusual diarrhea, constipation, pain near the anus) TENDERNESS IN MOUTH AND THROAT WITH OR WITHOUT PRESENCE OF ULCERS (sore throat, sores in mouth, or a toothache) UNUSUAL RASH, SWELLING OR PAIN  UNUSUAL VAGINAL DISCHARGE OR ITCHING   Items with * indicate a potential emergency and should be followed up as soon as possible or go to the Emergency Department if any problems should occur.  Please show the CHEMOTHERAPY ALERT CARD or IMMUNOTHERAPY  ALERT CARD at check-in to the Emergency Department and triage nurse.  Should you have questions after your visit or need to cancel or reschedule your appointment, please contact [facility]  Dept: [phone]  and follow the prompts.  Office hours are 8:00 a.m. to 4:30 p.m. Monday - Friday. Please note that voicemails left after 4:00 p.m. may not be returned until the following business day.  We are closed weekends and major holidays. You have access to a nurse at all times for urgent questions. Please call the main number to the clinic Dept: [phone] and follow the prompts.   For any non-urgent questions, you may also contact your provider using MyChart. We now offer e-Visits for anyone 53 and older to request care online for non-urgent symptoms. For details visit [email or web address].   Also download the MyChart app! Go to the app store, search "MyChart", open the app, select [facility], and log in with your MyChart username and password.  Due to Covid, a mask is required upon entering the hospital/clinic. If you do not have a mask, one will be given to you upon arrival. For doctor visits, patients may have 1 support person aged 53 or older with them. For treatment visits, patients cannot have anyone with them due to current Covid guidelines and our immunocompromised population.

## 2020-12-04 NOTE — Progress Notes (Addendum)
Nutrition Follow-up:   Patient with small cell lung cancer with soft tissue lesion in descending colon.    Met with patient during infusion.  Patient reports that her appetite has been good. Eating graham crackers during visit.  Reports usually has a biscuit for breakfast and snacks during the day and has evening meal (meat, potato and vegetable).  Reports minimal nausea relieved with medication.      Medications: reviewed  Labs: reviewed  Anthropometrics:   Weight 212 lb 9.6 oz today  10/17 217 lb 6.4 oz   Stable weight of 210 lb-217 lb   NUTRITION DIAGNOSIS: Inadequate oral intake improving   INTERVENTION:  Encouraged patient to eat well balanced diet including plant foods and lean protein for weight maintenance during treatment    MONITORING, EVALUATION, GOAL: weight trends, intake   NEXT VISIT: as needed, during treatments  Brayah Urquilla B. Zenia Resides, [place], [place] Registered Dietitian [phone] (mobile)

## 2020-12-05 ENCOUNTER — Inpatient Hospital Stay: Payer: Medicare HMO

## 2020-12-05 VITALS — BP 121/77 | HR 100 | Temp 97.7°F | Resp 17

## 2020-12-05 DIAGNOSIS — D72829 Elevated white blood cell count, unspecified: Secondary | ICD-10-CM | POA: Diagnosis not present

## 2020-12-05 DIAGNOSIS — R69 Illness, unspecified: Secondary | ICD-10-CM | POA: Diagnosis not present

## 2020-12-05 DIAGNOSIS — E785 Hyperlipidemia, unspecified: Secondary | ICD-10-CM | POA: Diagnosis not present

## 2020-12-05 DIAGNOSIS — I10 Essential (primary) hypertension: Secondary | ICD-10-CM | POA: Diagnosis not present

## 2020-12-05 DIAGNOSIS — Z5112 Encounter for antineoplastic immunotherapy: Secondary | ICD-10-CM | POA: Diagnosis not present

## 2020-12-05 DIAGNOSIS — C3490 Malignant neoplasm of unspecified part of unspecified bronchus or lung: Secondary | ICD-10-CM

## 2020-12-05 DIAGNOSIS — Z5189 Encounter for other specified aftercare: Secondary | ICD-10-CM | POA: Diagnosis not present

## 2020-12-05 DIAGNOSIS — R5383 Other fatigue: Secondary | ICD-10-CM | POA: Diagnosis not present

## 2020-12-05 DIAGNOSIS — C3431 Malignant neoplasm of lower lobe, right bronchus or lung: Secondary | ICD-10-CM | POA: Diagnosis not present

## 2020-12-05 DIAGNOSIS — Z5111 Encounter for antineoplastic chemotherapy: Secondary | ICD-10-CM | POA: Diagnosis not present

## 2020-12-05 DIAGNOSIS — R109 Unspecified abdominal pain: Secondary | ICD-10-CM | POA: Diagnosis not present

## 2020-12-05 MED ORDER — SODIUM CHLORIDE 0.9 % IV SOLN
100.0000 mg/m2 | Freq: Once | INTRAVENOUS | Status: AC
  Administered 2020-12-05: 200 mg via INTRAVENOUS
  Filled 2020-12-05: qty 10

## 2020-12-05 MED ORDER — SODIUM CHLORIDE 0.9 % IV SOLN
Freq: Once | INTRAVENOUS | Status: AC
Start: 2020-12-05 — End: 2020-12-05

## 2020-12-05 MED ORDER — SODIUM CHLORIDE 0.9% FLUSH
10.0000 mL | INTRAVENOUS | Status: DC | PRN
  Administered 2020-12-05: 10 mL

## 2020-12-05 MED ORDER — SODIUM CHLORIDE 0.9 % IV SOLN
10.0000 mg | Freq: Once | INTRAVENOUS | Status: AC
  Administered 2020-12-05: 10 mg via INTRAVENOUS
  Filled 2020-12-05: qty 10

## 2020-12-05 MED ORDER — HEPARIN SOD (PORK) LOCK FLUSH 100 UNIT/ML IV SOLN
500.0000 [IU] | Freq: Once | INTRAVENOUS | Status: AC | PRN
  Administered 2020-12-05: 500 [IU]

## 2020-12-05 MED FILL — Dexamethasone Sodium Phosphate Inj 100 MG/10ML: INTRAMUSCULAR | Qty: 1 | Status: AC

## 2020-12-05 NOTE — Patient Instructions (Signed)
[facility]  Discharge Instructions: Thank you for choosing [facility] to provide your oncology and hematology care.   If you have a lab appointment with the [facility], please go directly to the [facility] and check in at the registration area.   Wear comfortable clothing and clothing appropriate for easy access to any Portacath or PICC line.   We strive to give you quality time with your provider. You may need to reschedule your appointment if you arrive late (15 or more minutes).  Arriving late affects you and other patients whose appointments are after yours.  Also, if you miss three or more appointments without notifying the office, you may be dismissed from the clinic at the provider's discretion.      For prescription refill requests, have your pharmacy contact our office and allow 72 hours for refills to be completed.    Today you received the following chemotherapy and/or immunotherapy agents: Etoposide      To help prevent nausea and vomiting after your treatment, we encourage you to take your nausea medication as directed.  BELOW ARE SYMPTOMS THAT SHOULD BE REPORTED IMMEDIATELY: *FEVER GREATER THAN 100.4 F (38 C) OR HIGHER *CHILLS OR SWEATING *NAUSEA AND VOMITING THAT IS NOT CONTROLLED WITH YOUR NAUSEA MEDICATION *UNUSUAL SHORTNESS OF BREATH *UNUSUAL BRUISING OR BLEEDING *URINARY PROBLEMS (pain or burning when urinating, or frequent urination) *BOWEL PROBLEMS (unusual diarrhea, constipation, pain near the anus) TENDERNESS IN MOUTH AND THROAT WITH OR WITHOUT PRESENCE OF ULCERS (sore throat, sores in mouth, or a toothache) UNUSUAL RASH, SWELLING OR PAIN  UNUSUAL VAGINAL DISCHARGE OR ITCHING   Items with * indicate a potential emergency and should be followed up as soon as possible or go to the Emergency Department if any problems should occur.  Please show the CHEMOTHERAPY ALERT CARD or IMMUNOTHERAPY ALERT CARD at check-in to  the Emergency Department and triage nurse.  Should you have questions after your visit or need to cancel or reschedule your appointment, please contact [facility]  Dept: [phone]  and follow the prompts.  Office hours are 8:00 a.m. to 4:30 p.m. Monday - Friday. Please note that voicemails left after 4:00 p.m. may not be returned until the following business day.  We are closed weekends and major holidays. You have access to a nurse at all times for urgent questions. Please call the main number to the clinic Dept: [phone] and follow the prompts.   For any non-urgent questions, you may also contact your provider using MyChart. We now offer e-Visits for anyone 64 and older to request care online for non-urgent symptoms. For details visit [email or web address].   Also download the MyChart app! Go to the app store, search "MyChart", open the app, select [facility], and log in with your MyChart username and password.  Due to Covid, a mask is required upon entering the hospital/clinic. If you do not have a mask, one will be given to you upon arrival. For doctor visits, patients may have 1 support person aged 64 or older with them. For treatment visits, patients cannot have anyone with them due to current Covid guidelines and our immunocompromised population.

## 2020-12-05 NOTE — Progress Notes (Signed)
[place] Telephone:([phone]) [phone]   Fax:([phone]) [phone]  PROGRESS NOTE  Patient Care Team: Margaretha Seeds, MD as PCP - General (Pulmonary Disease) Milus Banister, MD as Attending Physician (Gastroenterology) Dr. Darylene Price, MD as Consulting Physician (Orthopedic Surgery) Latanya Maudlin, MD as Consulting Physician (Orthopedic Surgery) Kathrynn Ducking, MD as Consulting Physician (Neurology) Okey Regal, [place] as Consulting Physician (Optometry) [name], [facility] as Pharmacist (Pharmacist) Cameron Sprang, MD as Consulting Physician (Neurology)  Hematological/Oncological History # Small Cell Lung Cancer, Extensive Stage 07/05/2020: CT abdomen for lower abdominal pain. New pulmonary nodular density noted 07/06/2020: CT chest showed 2.2 cm macrolobulated right lower lobe pulmonary nodule (favored) versus pathologically enlarged infrahilar lymph node 10/13/2020: PET CT scan performed, findings show 2 cm right lower lobe lung mass is hypermetabolic and consistent with primary lung neoplasm. Additionally found hypermetabolic 17 mm soft tissue lesion between the descending duodenum and the pancreatic head  10/19/2020: EGD to biopsy hypermetabolic lymph node. Biopsy results show small cell lung cancer 10/26/2020: establish care with Dr. Lorenso Courier  11/13/2020: Cycle 1 Day 1 of Carbo/Etop/Atezolizumab 12/04/2020: Cycle 2 Day 1 of Carbo/Etop/Atezolizumab  Interval History:  [name] 65 y.o. female with medical history significant for extensive stage small cell lung cancer who presents for a follow up visit. The patient's last visit was on 11/13/2020 at which time she established care. In the interim since the last visit she has completed cycle 1 of chemotherapy and tolerated it well.  On exam today [name] is accompanied by her husband.  She reports that she has continued to have her baseline level of fatigue which has not worsened with chemotherapy treatment.   She notes that after she received chemotherapy she "did great".  Until Friday when she received the G-CSF shot.  She notes that she had a decrease in appetite and increased fatigue.  She reports that she does not have any bone pain.  She is discouraged by the fact that she is currently losing her hair which she has been very proud of.  She also has an infection on the upper eyelid of her left eye which is currently under the management of ophthalmology.  She is taking antibiotic therapy for this.  She had a little bit of queasiness with her last cycle but overall did not have any major side effects.  She currently denies any fevers, chills, sweats, nausea, vomiting or diarrhea.  A full 10 point ROS is listed below.   MEDICAL HISTORY:  Past Medical History:  Diagnosis Date   Allergic rhinitis    Anemia    Anxiety    Chicken pox    Chronic back pain    COPD (chronic obstructive pulmonary disease) (HCC)    Depression    DM (diabetes mellitus) ([place])    Essential hypertension    GERD (gastroesophageal reflux disease)    Headache    migraines   History of gastritis    EGD 2015   History of home oxygen therapy    2 liters at hs last 6 months   Hyperlipidemia    Hypothyroidism    Migraines    Osteoarthritis    oa   Scoliosis     SURGICAL HISTORY: Past Surgical History:  Procedure Laterality Date   APPENDECTOMY     1985   BIOPSY  07/24/2017   Procedure: BIOPSY;  Surgeon: Milus Banister, MD;  [facility]: [facility];  Service: Endoscopy;;   CARDIAC CATHETERIZATION N/A 10/31/2015  Procedure: Left Heart Cath and Coronary Angiography;  Surgeon: Leonie Man, MD;  Location: [facility];  Service: Cardiovascular;  Laterality: N/A;   CARPAL TUNNEL RELEASE Left    CARPAL TUNNEL RELEASE Right    CHOLECYSTECTOMY  late 1980's   COLONOSCOPY WITH PROPOFOL N/A 07/24/2017   Procedure: COLONOSCOPY WITH PROPOFOL;  Surgeon: Milus Banister, MD;  [facility];  Service: Endoscopy;   Laterality: N/A;   ESOPHAGOGASTRODUODENOSCOPY N/A 07/24/2017   Procedure: ESOPHAGOGASTRODUODENOSCOPY (EGD);  Surgeon: Milus Banister, MD;  Location: [facility];  Service: Endoscopy;  Laterality: N/A;   ESOPHAGOGASTRODUODENOSCOPY (EGD) WITH PROPOFOL N/A 10/19/2020   Procedure: ESOPHAGOGASTRODUODENOSCOPY (EGD) WITH PROPOFOL;  Surgeon: Milus Banister, MD;  [facility];  Service: Endoscopy;  Laterality: N/A;   FINE NEEDLE ASPIRATION N/A 10/19/2020   Procedure: FINE NEEDLE ASPIRATION (FNA) LINEAR;  Surgeon: Milus Banister, MD;  [facility];  Service: Endoscopy;  Laterality: N/A;   GALLBLADDER SURGERY  1991   HIP CLOSED REDUCTION Right 01/08/2016   Procedure: CLOSED MANIPULATION HIP;  Surgeon: Susa Day, MD;  [facility];  Service: Orthopedics;  Laterality: Right;   HIP CLOSED REDUCTION Right 01/19/2016   Procedure: ATTEMPTED CLOSED REDUCTION RIGHT HIP;  Surgeon: Wylene Simmer, MD;  [facility];  Service: Orthopedics;  Laterality: Right;   HIP CLOSED REDUCTION Right 01/20/2016   Procedure: CLOSED REDUCTION RIGHT TOTAL HIP;  Surgeon: Paralee Cancel, MD;  [facility];  Service: Orthopedics;  Laterality: Right;   HIP CLOSED REDUCTION Right 02/17/2016   Procedure: CLOSED REDUCTION RIGHT TOTAL HIP;  Surgeon: Rod Can, MD;  Location: [facility];  Service: Orthopedics;  Laterality: Right;   HIP CLOSED REDUCTION Right 02/28/2016   Procedure: CLOSED REDUCTION HIP;  Surgeon: Nicholes Stairs, MD;  [facility];  Service: Orthopedics;  Laterality: Right;   IR IMAGING GUIDED PORT INSERTION  11/01/2020   POLYPECTOMY  07/24/2017   Procedure: POLYPECTOMY;  Surgeon: Milus Banister, MD;  [facility];  Service: Endoscopy;;   [place], with 1 ovary removed and 2 nd ovary removed 2003   TOTAL HIP ARTHROPLASTY Right    Original surgery 2006 with revision 2010   TOTAL HIP REVISION Right 01/01/2016   Procedure:  TOTAL HIP REVISION;  Surgeon: Paralee Cancel, MD;  [facility];  Service: Orthopedics;  Laterality: Right;   TOTAL HIP REVISION Right 03/02/2016   Procedure: TOTAL HIP REVISION;  Surgeon: Paralee Cancel, MD;  [facility];  Service: Orthopedics;  Laterality: Right;   TOTAL HIP REVISION Right 09/02/2016   Procedure: Right hip constrained liner- posterior;  Surgeon: Paralee Cancel, MD;  [facility];  Service: Orthopedics;  Laterality: Right;   ULNAR NERVE TRANSPOSITION Right    UPPER ESOPHAGEAL ENDOSCOPIC ULTRASOUND (EUS) N/A 10/19/2020   Procedure: UPPER ESOPHAGEAL ENDOSCOPIC ULTRASOUND (EUS);  Surgeon: Milus Banister, MD;  Location: [facility];  Service: Endoscopy;  Laterality: N/A;  periduodenal lesion    SOCIAL HISTORY: Social History   Socioeconomic History   Marital status: Married    Spouse name: Not on file   Number of children: 2   Years of education: Not on file   Highest education level: Not on file  Occupational History   Occupation: disabled   Occupation: disabled  Tobacco Use   Smoking status: Every Day    Packs/day: 2.50    Years: 46.00    Pack years: 115.00  Types: Cigarettes   Smokeless tobacco: Never   Tobacco comments:    Currently smoking 1.5ppd as of 11/10/20  Vaping Use   Vaping Use: Never used  Substance and Sexual Activity   Alcohol use: No   Drug use: No   Sexual activity: Not Currently    Partners: Male  Other Topics Concern   Not on file  Social History Narrative   Right handed    Caffeine~ 2 cups per day    Lives at home with husband (strained relationship)   Primary caretaker for disabled brother who had aneurism   Daughter died 06/30/2018    Social Determinants of Health   Financial Resource Strain: Low Risk    Difficulty of Paying Living Expenses: Not hard at all  Food Insecurity: Not on file  Transportation Needs: No Transportation Needs   Lack of Transportation (Medical): No   Lack of Transportation (Non-Medical): No   Physical Activity: Inactive   Days of Exercise per Week: 0 days   Minutes of Exercise per Session: 0 min  Stress: Stress Concern Present   Feeling of Stress : To some extent  Social Connections: Moderately Integrated   Frequency of Communication with Friends and Family: More than three times a week   Frequency of Social Gatherings with Friends and Family: Once a week   Attends Religious Services: 1 to 4 times per year   Active Member of [employer] or Organizations: No   Attends Music therapist: Never   Marital Status: Married  Human resources officer Violence: Not At Risk   Fear of Current or Ex-Partner: No   Emotionally Abused: No   Physically Abused: No   Sexually Abused: No    FAMILY HISTORY: Family History  Problem Relation Age of Onset   COPD Mother    Heart disease Mother    Lung disease Father        Asbestosis   Heart attack Father    Heart disease Father    Cerebral aneurysm Brother    Aneurysm Brother        Brain   Drug abuse Daughter    Epilepsy Son    Alcohol abuse Son    Drug abuse Son    Arthritis Maternal Grandmother    Heart disease Maternal Grandmother    Asthma Maternal Grandfather    Cancer Maternal Grandfather    Arthritis Paternal Grandmother    Heart disease Paternal Grandmother    Stroke Paternal Grandmother    Early death Paternal Grandfather    Heart disease Paternal Grandfather     ALLERGIES:  is allergic to metformin and related, nsaids, aleve [naproxen sodium], codeine, penicillins, and sulfonamide derivatives.  MEDICATIONS:  Current Outpatient Medications  Medication Sig Dispense Refill   albuterol (PROAIR HFA) 108 (90 Base) MCG/ACT inhaler 2 puffs every 4 hours as needed only  if your can't catch your breath 18 g 3   ALPRAZolam (XANAX) 1 MG tablet Take 1 tablet po TID as needed for anxiety. 90 tablet 2   aspirin EC 81 MG tablet Take 1 tablet (81 mg total) by mouth daily. Swallow whole. 90 tablet 3    Aspirin-Acetaminophen-Caffeine (GOODY HEADACHE PO) Take 1 packet by mouth 3 (three) times daily as needed (headaches.).     atorvastatin (LIPITOR) 20 MG tablet Take 1 tablet (20 mg total) by mouth at bedtime. 90 tablet 1   cyclobenzaprine (FLEXERIL) 10 MG tablet Take 1 tablet (10 mg total) by mouth 3 (three) times daily as needed  for muscle spasms. 90 tablet 5   dicyclomine (BENTYL) 10 MG capsule Take 1 capsule (10 mg total) by mouth 2 (two) times daily as needed for spasms. 30 capsule 0   DULoxetine (CYMBALTA) 60 MG capsule Take 2 capsules (120 mg total) by mouth daily. (Patient taking differently: Take 120 mg by mouth at bedtime.) 60 capsule 5   fluticasone (FLONASE) 50 MCG/ACT nasal spray Place 2 sprays into both nostrils daily. 16 g 6   Fluticasone-Umeclidin-Vilant (TRELEGY ELLIPTA) 200-62.5-25 MCG/INH AEPB Inhale 1 puff into the lungs daily. 60 each 5   furosemide (LASIX) 20 MG tablet TAKE 1 TABLET BY MOUTH EVERY DAY AS NEEDED 30 tablet 90   glucose blood test strip Use as instructed 300 each 11   HYDROcodone-acetaminophen (NORCO) 10-325 MG tablet TAKE 1 TABLET BY MOUTH EVERY 6 HOURS AS NEEDED 120 tablet 0   levothyroxine (SYNTHROID) 150 MCG tablet TAKE 1 TABLET BY MOUTH DAILY BEFORE breakfast 30 tablet 3   Lidocaine 4 % PTCH Place 1 patch onto the skin daily as needed (pain.).     lidocaine-prilocaine (EMLA) cream Apply 1 application topically as needed. 30 g 0   loratadine (CLARITIN) 10 MG tablet Take 10 mg by mouth daily.     omeprazole (PRILOSEC) 40 MG capsule TAKE ONE CAPSULE BY MOUTH TWICE DAILY 60 capsule 2   ondansetron (ZOFRAN) 8 MG tablet Take 1 tablet (8 mg total) by mouth every 8 (eight) hours as needed. 30 tablet 0   OXYGEN Inhale 2 L into the lungs at bedtime. Uses At Night     potassium chloride (KLOR-CON) 10 MEQ tablet Take 1 tablet (10 mEq total) by mouth at bedtime. 90 tablet 1   prochlorperazine (COMPAZINE) 10 MG tablet Take 1 tablet (10 mg total) by mouth every 6 (six)  hours as needed for nausea or vomiting. 30 tablet 0   triamterene-hydrochlorothiazide (MAXZIDE-25) 37.5-25 MG tablet Take 1 tablet by mouth daily. 90 tablet 1   No current facility-administered medications for this visit.    REVIEW OF SYSTEMS:   Constitutional: ( - ) fevers, ( - )  chills , ( - ) night sweats Eyes: ( - ) blurriness of vision, ( - ) double vision, ( - ) watery eyes Ears, nose, mouth, throat, and face: ( - ) mucositis, ( - ) sore throat Respiratory: ( - ) cough, ( - ) dyspnea, ( - ) wheezes Cardiovascular: ( - ) palpitation, ( - ) chest discomfort, ( - ) lower extremity swelling Gastrointestinal:  ( - ) nausea, ( - ) heartburn, ( - ) change in bowel habits Skin: ( - ) abnormal skin rashes Lymphatics: ( - ) new lymphadenopathy, ( - ) easy bruising Neurological: ( - ) numbness, ( - ) tingling, ( - ) new weaknesses Behavioral/Psych: ( - ) mood change, ( - ) new changes  All other systems were reviewed with the patient and are negative.  PHYSICAL EXAMINATION: ECOG PERFORMANCE STATUS: 1 - Symptomatic but completely ambulatory  Vitals:   12/04/20 1035  BP: (!) 159/90  Pulse: (!) 104  Resp: 18  Temp: 97.9 F (36.6 C)  SpO2: 100%   Filed Weights   12/04/20 1035  Weight: 212 lb 9.6 oz (96.4 kg)    GENERAL: Well-appearing middle-age Caucasian female, alert, no distress and comfortable SKIN: skin color, texture, turgor are normal, no rashes or significant lesions EYES: conjunctiva are pink and non-injected, sclera clear LUNGS: clear to auscultation and percussion with normal breathing effort HEART:  regular rate & rhythm and no murmurs and no lower extremity edema Musculoskeletal: no cyanosis of digits and no clubbing  PSYCH: alert & oriented x 3, fluent speech NEURO: no focal motor/sensory deficits  LABORATORY DATA:  I have reviewed the data as listed CBC Latest Ref Rng & Units 12/04/2020 11/13/2020 11/01/2020  WBC 4.0 - 10.5 K/uL 15.7(H) 11.6(H) 11.4(H)  Hemoglobin  12.0 - 15.0 g/dL 11.8(L) 13.4 13.4  Hematocrit 36.0 - 46.0 % 36.4 41.5 42.1  Platelets 150 - 400 K/uL 326 204 237    CMP Latest Ref Rng & Units 12/04/2020 11/13/2020 10/26/2020  Glucose 70 - 99 mg/dL 103(H) 101(H) 90  BUN 8 - 23 mg/dL 6(L) 10 10  Creatinine 0.44 - 1.00 mg/dL 0.69 0.81 1.01(H)  Sodium 135 - 145 mmol/L 135 134(L) 133(L)  Potassium 3.5 - 5.1 mmol/L 3.8 3.8 4.5  Chloride 98 - 111 mmol/L 101 99 97(L)  CO2 22 - 32 mmol/L 27 24 25   Calcium 8.9 - 10.3 mg/dL 8.3(L) 8.9 9.2  Total Protein 6.5 - 8.1 g/dL 6.4(L) 6.8 7.5  Total Bilirubin 0.3 - 1.2 mg/dL 0.3 0.4 0.5  Alkaline Phos 38 - 126 U/L 107 75 94  AST 15 - 41 U/L 23 19 25   ALT 0 - 44 U/L 24 15 21     No results found for: MPROTEIN Lab Results  Component Value Date   KPAFRELGTCHN 0.75 06/30/2014   LAMBDASER 3.78 (H) 06/30/2014   KAPLAMBRATIO 0.20 (L) 06/30/2014     RADIOGRAPHIC STUDIES: No results found.  ASSESSMENT & PLAN [name] 65 y.o. female with medical history significant for extensive stage small cell lung cancer who presents for a follow up visit.   After review of the labs, review of the records, and discussion with the patient the patients findings are most consistent with extensive stage small cell lung cancer with metastasis from the right lower lobe to the lymph nodes of the abdomen.  At this time we will pursue triple therapy with carboplatin, etoposide, and atezolizumab.  After 4 cycles we will convert to maintenance atezolizumab alone.  We previously discussed the risks and benefits of this therapy and the patient was in agreement to proceed with this treatment.  The treatment of choice consist of carboplatin, etoposide, and atezolizumab.  The regimen consists of carboplatin AUC of 5 IV on day 1, etoposide 100 mg per metered squared IV on day 1, 2, and 3 and atezolizumab 1200 mg on day 1.  This continues for 21-day cycles.  After 4 cycles the patient proceeds with atezolizumab maintenance therapy  alone.   #Small Cell Lung Cancer, Extensive Stage -- MRI of the brain shows no evidence of intracranial spread --Findings are currently consistent with metastatic small cell lung cancer with metastatic spread to the lymph nodes of the abdomen --Prognosis is poor with anticipated life span of less than 12 months -- Patient has extensive stage due to metastatic spread to lymph nodes in the abdomen --Plan to proceed with carboplatin, etoposide, and atezolizumab --will consider prophylactic cranial radiation after start of chemotherapy.  Plan: --today is Cycle 2 Day 1 of Carbo/Etop/Atezolizumab --GCSF shot on Day 3 --RTC in 3 weeks for next cycle of treatment.   #Leukocytosis #Left Upper Eyelid Infection --baseline around 11, increased to 15.7 today --potentially secondary to eye lid infection, under the management of ophthalmology.  --continue to monitor    #Supportive Care -- chemotherapy education complete -- port placed -- zofran 8mg  q8H PRN and compazine 10mg   PO q6H for nausea -- EMLA cream for port -- no pain medication required at this time.   No orders of the defined types were placed in this encounter.   All questions were answered. The patient knows to call the clinic with any problems, questions or concerns.  A total of more than 30 minutes were spent on this encounter with face-to-face time and non-face-to-face time, including preparing to see the patient, ordering tests and/or medications, counseling the patient and coordination of care as outlined above.   Ledell Peoples, MD Department of Hematology/Oncology [place] at [facility] Phone: [phone] Pager: [phone] Email: [email or web address]  12/05/2020 8:39 AM

## 2020-12-06 ENCOUNTER — Encounter: Payer: Self-pay | Admitting: General Practice

## 2020-12-06 ENCOUNTER — Inpatient Hospital Stay: Payer: Medicare HMO

## 2020-12-06 ENCOUNTER — Ambulatory Visit: Payer: Medicare HMO | Admitting: Hematology and Oncology

## 2020-12-06 ENCOUNTER — Other Ambulatory Visit: Payer: Self-pay

## 2020-12-06 ENCOUNTER — Other Ambulatory Visit: Payer: Medicare HMO

## 2020-12-06 ENCOUNTER — Ambulatory Visit: Payer: Medicare HMO

## 2020-12-06 VITALS — BP 149/85 | HR 93 | Temp 99.0°F | Resp 18

## 2020-12-06 DIAGNOSIS — E785 Hyperlipidemia, unspecified: Secondary | ICD-10-CM | POA: Diagnosis not present

## 2020-12-06 DIAGNOSIS — C3431 Malignant neoplasm of lower lobe, right bronchus or lung: Secondary | ICD-10-CM | POA: Diagnosis not present

## 2020-12-06 DIAGNOSIS — R5383 Other fatigue: Secondary | ICD-10-CM | POA: Diagnosis not present

## 2020-12-06 DIAGNOSIS — D72829 Elevated white blood cell count, unspecified: Secondary | ICD-10-CM | POA: Diagnosis not present

## 2020-12-06 DIAGNOSIS — R69 Illness, unspecified: Secondary | ICD-10-CM | POA: Diagnosis not present

## 2020-12-06 DIAGNOSIS — I10 Essential (primary) hypertension: Secondary | ICD-10-CM | POA: Diagnosis not present

## 2020-12-06 DIAGNOSIS — Z5111 Encounter for antineoplastic chemotherapy: Secondary | ICD-10-CM | POA: Diagnosis not present

## 2020-12-06 DIAGNOSIS — R109 Unspecified abdominal pain: Secondary | ICD-10-CM | POA: Diagnosis not present

## 2020-12-06 DIAGNOSIS — C3490 Malignant neoplasm of unspecified part of unspecified bronchus or lung: Secondary | ICD-10-CM

## 2020-12-06 DIAGNOSIS — Z5189 Encounter for other specified aftercare: Secondary | ICD-10-CM | POA: Diagnosis not present

## 2020-12-06 DIAGNOSIS — Z5112 Encounter for antineoplastic immunotherapy: Secondary | ICD-10-CM | POA: Diagnosis not present

## 2020-12-06 MED ORDER — SODIUM CHLORIDE 0.9% FLUSH
10.0000 mL | INTRAVENOUS | Status: DC | PRN
  Administered 2020-12-06: 10 mL

## 2020-12-06 MED ORDER — SODIUM CHLORIDE 0.9 % IV SOLN
100.0000 mg/m2 | Freq: Once | INTRAVENOUS | Status: AC
  Administered 2020-12-06: 200 mg via INTRAVENOUS
  Filled 2020-12-06: qty 10

## 2020-12-06 MED ORDER — HEPARIN SOD (PORK) LOCK FLUSH 100 UNIT/ML IV SOLN
500.0000 [IU] | Freq: Once | INTRAVENOUS | Status: AC | PRN
  Administered 2020-12-06: 500 [IU]

## 2020-12-06 MED ORDER — SODIUM CHLORIDE 0.9 % IV SOLN
Freq: Once | INTRAVENOUS | Status: AC
Start: 2020-12-06 — End: 2020-12-06

## 2020-12-06 MED ORDER — SODIUM CHLORIDE 0.9 % IV SOLN
10.0000 mg | Freq: Once | INTRAVENOUS | Status: AC
  Administered 2020-12-06: 10 mg via INTRAVENOUS
  Filled 2020-12-06: qty 10

## 2020-12-06 NOTE — Patient Instructions (Signed)
[facility] ONCOLOGY  Discharge Instructions: Thank you for choosing [facility] to provide your oncology and hematology care.   If you have a lab appointment with the [facility], please go directly to the [facility] and check in at the registration area.   Wear comfortable clothing and clothing appropriate for easy access to any Portacath or PICC line.   We strive to give you quality time with your provider. You may need to reschedule your appointment if you arrive late (15 or more minutes).  Arriving late affects you and other patients whose appointments are after yours.  Also, if you miss three or more appointments without notifying the office, you may be dismissed from the clinic at the provider's discretion.      For prescription refill requests, have your pharmacy contact our office and allow 72 hours for refills to be completed.    Today you received the following chemotherapy and/or immunotherapy agents: Etoposide   To help prevent nausea and vomiting after your treatment, we encourage you to take your nausea medication as directed.  BELOW ARE SYMPTOMS THAT SHOULD BE REPORTED IMMEDIATELY: *FEVER GREATER THAN 100.4 F (38 C) OR HIGHER *CHILLS OR SWEATING *NAUSEA AND VOMITING THAT IS NOT CONTROLLED WITH YOUR NAUSEA MEDICATION *UNUSUAL SHORTNESS OF BREATH *UNUSUAL BRUISING OR BLEEDING *URINARY PROBLEMS (pain or burning when urinating, or frequent urination) *BOWEL PROBLEMS (unusual diarrhea, constipation, pain near the anus) TENDERNESS IN MOUTH AND THROAT WITH OR WITHOUT PRESENCE OF ULCERS (sore throat, sores in mouth, or a toothache) UNUSUAL RASH, SWELLING OR PAIN  UNUSUAL VAGINAL DISCHARGE OR ITCHING   Items with * indicate a potential emergency and should be followed up as soon as possible or go to the Emergency Department if any problems should occur.  Please show the CHEMOTHERAPY ALERT CARD or IMMUNOTHERAPY ALERT CARD at check-in to the  Emergency Department and triage nurse.  Should you have questions after your visit or need to cancel or reschedule your appointment, please contact [facility]  Dept: [phone]  and follow the prompts.  Office hours are 8:00 a.m. to 4:30 p.m. Monday - Friday. Please note that voicemails left after 4:00 p.m. may not be returned until the following business day.  We are closed weekends and major holidays. You have access to a nurse at all times for urgent questions. Please call the main number to the clinic Dept: [phone] and follow the prompts.   For any non-urgent questions, you may also contact your provider using MyChart. We now offer e-Visits for anyone 64 and older to request care online for non-urgent symptoms. For details visit [email or web address].   Also download the MyChart app! Go to the app store, search "MyChart", open the app, select [facility], and log in with your MyChart username and password.  Due to Covid, a mask is required upon entering the hospital/clinic. If you do not have a mask, one will be given to you upon arrival. For doctor visits, patients may have 1 support person aged 64 or older with them. For treatment visits, patients cannot have anyone with them due to current Covid guidelines and our immunocompromised population.

## 2020-12-06 NOTE — Progress Notes (Signed)
[facility] Spiritual Care Note  Followed up with [name] in infusion. She reports being in a much better place emotionally and relationally, thanks in part to communication improvements and clarity. We plan a lengthier visit at her next treatment. Her goal is to share her thoughts about the afterlife.   [place], [place], [facility] Pager [phone] Voicemail [phone]

## 2020-12-07 ENCOUNTER — Ambulatory Visit: Payer: Medicare HMO

## 2020-12-08 ENCOUNTER — Inpatient Hospital Stay: Payer: Medicare HMO

## 2020-12-08 ENCOUNTER — Other Ambulatory Visit: Payer: Self-pay

## 2020-12-08 ENCOUNTER — Ambulatory Visit: Payer: Medicare HMO

## 2020-12-08 VITALS — BP 153/79 | HR 103 | Temp 98.4°F | Resp 18

## 2020-12-08 DIAGNOSIS — E785 Hyperlipidemia, unspecified: Secondary | ICD-10-CM | POA: Diagnosis not present

## 2020-12-08 DIAGNOSIS — C3490 Malignant neoplasm of unspecified part of unspecified bronchus or lung: Secondary | ICD-10-CM

## 2020-12-08 DIAGNOSIS — Z5111 Encounter for antineoplastic chemotherapy: Secondary | ICD-10-CM | POA: Diagnosis not present

## 2020-12-08 DIAGNOSIS — R5383 Other fatigue: Secondary | ICD-10-CM | POA: Diagnosis not present

## 2020-12-08 DIAGNOSIS — R109 Unspecified abdominal pain: Secondary | ICD-10-CM | POA: Diagnosis not present

## 2020-12-08 DIAGNOSIS — R69 Illness, unspecified: Secondary | ICD-10-CM | POA: Diagnosis not present

## 2020-12-08 DIAGNOSIS — Z5112 Encounter for antineoplastic immunotherapy: Secondary | ICD-10-CM | POA: Diagnosis not present

## 2020-12-08 DIAGNOSIS — C3431 Malignant neoplasm of lower lobe, right bronchus or lung: Secondary | ICD-10-CM | POA: Diagnosis not present

## 2020-12-08 DIAGNOSIS — Z5189 Encounter for other specified aftercare: Secondary | ICD-10-CM | POA: Diagnosis not present

## 2020-12-08 DIAGNOSIS — D72829 Elevated white blood cell count, unspecified: Secondary | ICD-10-CM | POA: Diagnosis not present

## 2020-12-08 DIAGNOSIS — I10 Essential (primary) hypertension: Secondary | ICD-10-CM | POA: Diagnosis not present

## 2020-12-08 MED ORDER — PEGFILGRASTIM-CBQV 6 MG/0.6ML ~~LOC~~ SOSY
6.0000 mg | PREFILLED_SYRINGE | Freq: Once | SUBCUTANEOUS | Status: AC
  Administered 2020-12-08: 6 mg via SUBCUTANEOUS

## 2020-12-12 DIAGNOSIS — J449 Chronic obstructive pulmonary disease, unspecified: Secondary | ICD-10-CM | POA: Diagnosis not present

## 2020-12-12 DIAGNOSIS — N189 Chronic kidney disease, unspecified: Secondary | ICD-10-CM | POA: Diagnosis not present

## 2020-12-15 ENCOUNTER — Other Ambulatory Visit: Payer: Self-pay | Admitting: Hematology and Oncology

## 2020-12-18 ENCOUNTER — Other Ambulatory Visit: Payer: Self-pay

## 2020-12-18 ENCOUNTER — Emergency Department (HOSPITAL_COMMUNITY): Payer: Medicare HMO

## 2020-12-18 ENCOUNTER — Encounter: Payer: Medicare HMO | Admitting: Physical Medicine and Rehabilitation

## 2020-12-18 ENCOUNTER — Encounter (HOSPITAL_COMMUNITY): Payer: Self-pay | Admitting: *Deleted

## 2020-12-18 ENCOUNTER — Emergency Department (HOSPITAL_COMMUNITY)
Admission: EM | Admit: 2020-12-18 | Discharge: 2020-12-18 | Discharge status: Against Medical Advice | Payer: Medicare HMO | Source: Home / Self Care | Attending: Emergency Medicine | Admitting: Emergency Medicine

## 2020-12-18 ENCOUNTER — Telehealth: Payer: Self-pay | Admitting: *Deleted

## 2020-12-18 DIAGNOSIS — F17210 Nicotine dependence, cigarettes, uncomplicated: Secondary | ICD-10-CM | POA: Insufficient documentation

## 2020-12-18 DIAGNOSIS — Z20822 Contact with and (suspected) exposure to covid-19: Secondary | ICD-10-CM | POA: Insufficient documentation

## 2020-12-18 DIAGNOSIS — D696 Thrombocytopenia, unspecified: Secondary | ICD-10-CM | POA: Insufficient documentation

## 2020-12-18 DIAGNOSIS — Z79899 Other long term (current) drug therapy: Secondary | ICD-10-CM | POA: Insufficient documentation

## 2020-12-18 DIAGNOSIS — E119 Type 2 diabetes mellitus without complications: Secondary | ICD-10-CM | POA: Insufficient documentation

## 2020-12-18 DIAGNOSIS — A419 Sepsis, unspecified organism: Secondary | ICD-10-CM | POA: Insufficient documentation

## 2020-12-18 DIAGNOSIS — J449 Chronic obstructive pulmonary disease, unspecified: Secondary | ICD-10-CM | POA: Insufficient documentation

## 2020-12-18 DIAGNOSIS — E039 Hypothyroidism, unspecified: Secondary | ICD-10-CM | POA: Insufficient documentation

## 2020-12-18 DIAGNOSIS — R0602 Shortness of breath: Secondary | ICD-10-CM | POA: Insufficient documentation

## 2020-12-18 DIAGNOSIS — Z5321 Procedure and treatment not carried out due to patient leaving prior to being seen by health care provider: Secondary | ICD-10-CM | POA: Insufficient documentation

## 2020-12-18 DIAGNOSIS — Z7982 Long term (current) use of aspirin: Secondary | ICD-10-CM | POA: Insufficient documentation

## 2020-12-18 DIAGNOSIS — A4151 Sepsis due to Escherichia coli [E. coli]: Secondary | ICD-10-CM | POA: Diagnosis not present

## 2020-12-18 DIAGNOSIS — I10 Essential (primary) hypertension: Secondary | ICD-10-CM | POA: Insufficient documentation

## 2020-12-18 DIAGNOSIS — Z7952 Long term (current) use of systemic steroids: Secondary | ICD-10-CM | POA: Insufficient documentation

## 2020-12-18 DIAGNOSIS — R000 Tachycardia, unspecified: Secondary | ICD-10-CM | POA: Insufficient documentation

## 2020-12-18 DIAGNOSIS — R531 Weakness: Secondary | ICD-10-CM | POA: Diagnosis not present

## 2020-12-18 DIAGNOSIS — Z743 Need for continuous supervision: Secondary | ICD-10-CM | POA: Diagnosis not present

## 2020-12-18 DIAGNOSIS — Z96641 Presence of right artificial hip joint: Secondary | ICD-10-CM | POA: Insufficient documentation

## 2020-12-18 DIAGNOSIS — R1012 Left upper quadrant pain: Secondary | ICD-10-CM | POA: Insufficient documentation

## 2020-12-18 DIAGNOSIS — R1111 Vomiting without nausea: Secondary | ICD-10-CM | POA: Diagnosis not present

## 2020-12-18 DIAGNOSIS — R509 Fever, unspecified: Secondary | ICD-10-CM | POA: Diagnosis not present

## 2020-12-18 DIAGNOSIS — Z5329 Procedure and treatment not carried out because of patient's decision for other reasons: Secondary | ICD-10-CM

## 2020-12-18 DIAGNOSIS — E876 Hypokalemia: Secondary | ICD-10-CM | POA: Diagnosis not present

## 2020-12-18 LAB — URINALYSIS, ROUTINE W REFLEX MICROSCOPIC
Bilirubin Urine: NEGATIVE
Glucose, UA: NEGATIVE mg/dL
Ketones, ur: NEGATIVE mg/dL
Leukocytes,Ua: NEGATIVE
Nitrite: POSITIVE — AB
Protein, ur: 100 mg/dL — AB
Specific Gravity, Urine: 1.014 (ref 1.005–1.030)
pH: 7 (ref 5.0–8.0)

## 2020-12-18 LAB — COMPREHENSIVE METABOLIC PANEL
ALT: 28 U/L (ref 0–44)
AST: 21 U/L (ref 15–41)
Albumin: 2.7 g/dL — ABNORMAL LOW (ref 3.5–5.0)
Alkaline Phosphatase: 152 U/L — ABNORMAL HIGH (ref 38–126)
Anion gap: 10 (ref 5–15)
BUN: 10 mg/dL (ref 8–23)
CO2: 21 mmol/L — ABNORMAL LOW (ref 22–32)
Calcium: 7.5 mg/dL — ABNORMAL LOW (ref 8.9–10.3)
Chloride: 96 mmol/L — ABNORMAL LOW (ref 98–111)
Creatinine, Ser: 1.03 mg/dL — ABNORMAL HIGH (ref 0.44–1.00)
GFR, Estimated: >60 mL/min (ref >60)
Glucose, Bld: 179 mg/dL — ABNORMAL HIGH (ref 70–99)
Potassium: 3.2 mmol/L — ABNORMAL LOW (ref 3.5–5.1)
Sodium: 127 mmol/L — ABNORMAL LOW (ref 135–145)
Total Bilirubin: 0.6 mg/dL (ref 0.3–1.2)
Total Protein: 5.8 g/dL — ABNORMAL LOW (ref 6.5–8.1)

## 2020-12-18 LAB — CBC WITH DIFFERENTIAL/PLATELET
Band Neutrophils: 4 %
Basophils Absolute: 0 10*3/uL (ref 0.0–0.1)
Basophils Relative: 0 %
Eosinophils Absolute: 0 10*3/uL (ref 0.0–0.5)
Eosinophils Relative: 0 %
HCT: 32.6 % — ABNORMAL LOW (ref 36.0–46.0)
Hemoglobin: 11.1 g/dL — ABNORMAL LOW (ref 12.0–15.0)
Lymphocytes Relative: 4 %
Lymphs Abs: 0.8 10*3/uL (ref 0.7–4.0)
MCH: 27.7 pg (ref 26.0–34.0)
MCHC: 34 g/dL (ref 30.0–36.0)
MCV: 81.3 fL (ref 80.0–100.0)
Metamyelocytes Relative: 1 %
Monocytes Absolute: 1 10*3/uL (ref 0.1–1.0)
Monocytes Relative: 5 %
Myelocytes: 3 %
Neutro Abs: 17.2 10*3/uL — ABNORMAL HIGH (ref 1.7–7.7)
Neutrophils Relative %: 83 %
Platelets: 18 10*3/uL — CL (ref 150–400)
RBC: 4.01 MIL/uL (ref 3.87–5.11)
RDW: 19.2 % — ABNORMAL HIGH (ref 11.5–15.5)
WBC: 19.8 10*3/uL — ABNORMAL HIGH (ref 4.0–10.5)
nRBC: 0.4 % — ABNORMAL HIGH (ref 0.0–0.2)

## 2020-12-18 LAB — PROTIME-INR
INR: 1.1 (ref 0.8–1.2)
Prothrombin Time: 14.4 seconds (ref 11.4–15.2)

## 2020-12-18 LAB — RESP PANEL BY RT-PCR (FLU A&B, COVID) ARPGX2
Influenza A by PCR: NEGATIVE
Influenza B by PCR: NEGATIVE
SARS Coronavirus 2 by RT PCR: NEGATIVE

## 2020-12-18 LAB — LACTIC ACID, PLASMA
Lactic Acid, Venous: 2 mmol/L (ref 0.5–1.9)
Lactic Acid, Venous: 1.5 mmol/L (ref 0.5–1.9)

## 2020-12-18 LAB — APTT: aPTT: 33 seconds (ref 24–36)

## 2020-12-18 LAB — LIPASE, BLOOD: Lipase: 13 U/L (ref 11–51)

## 2020-12-18 MED ORDER — SODIUM CHLORIDE 0.9 % IV SOLN
2.0000 g | Freq: Two times a day (BID) | INTRAVENOUS | Status: DC
  Administered 2020-12-18: 2 g via INTRAVENOUS
  Filled 2020-12-18: qty 2

## 2020-12-18 MED ORDER — LACTATED RINGERS IV BOLUS (SEPSIS)
600.0000 mL | Freq: Once | INTRAVENOUS | Status: AC
  Administered 2020-12-18: 600 mL via INTRAVENOUS

## 2020-12-18 MED ORDER — ONDANSETRON HCL 4 MG/2ML IJ SOLN
4.0000 mg | Freq: Once | INTRAMUSCULAR | Status: AC
  Administered 2020-12-18: 4 mg via INTRAVENOUS
  Filled 2020-12-18: qty 2

## 2020-12-18 MED ORDER — VANCOMYCIN HCL 2000 MG/400ML IV SOLN
2000.0000 mg | Freq: Once | INTRAVENOUS | Status: AC
  Administered 2020-12-18: 2000 mg via INTRAVENOUS
  Filled 2020-12-18: qty 400

## 2020-12-18 MED ORDER — VANCOMYCIN HCL 1500 MG/300ML IV SOLN: 1500.0000 mg | INTRAVENOUS | Status: DC

## 2020-12-18 MED ORDER — DOXYCYCLINE HYCLATE 100 MG PO CAPS: 100.0000 mg | ORAL_CAPSULE | Freq: Two times a day (BID) | ORAL | 0 refills | Status: DC

## 2020-12-18 MED ORDER — CEPHALEXIN 500 MG PO CAPS: 500.0000 mg | ORAL_CAPSULE | Freq: Four times a day (QID) | ORAL | 0 refills | Status: DC

## 2020-12-18 MED ORDER — IOHEXOL 300 MG/ML  SOLN: 100.0000 mL | Freq: Once | INTRAMUSCULAR | Status: DC | PRN

## 2020-12-18 MED ORDER — LACTATED RINGERS IV SOLN: INTRAVENOUS | Status: DC

## 2020-12-18 MED ORDER — ONDANSETRON 4 MG PO TBDP: 4.0000 mg | ORAL_TABLET | Freq: Once | ORAL | Status: DC

## 2020-12-18 MED ORDER — LACTATED RINGERS IV BOLUS (SEPSIS)
1000.0000 mL | Freq: Once | INTRAVENOUS | Status: AC
  Administered 2020-12-18: 1000 mL via INTRAVENOUS

## 2020-12-18 MED ORDER — ACETAMINOPHEN 500 MG PO TABS
1000.0000 mg | ORAL_TABLET | Freq: Once | ORAL | Status: AC
  Administered 2020-12-18: 1000 mg via ORAL
  Filled 2020-12-18: qty 2

## 2020-12-18 MED ORDER — METRONIDAZOLE 500 MG/100ML IV SOLN
500.0000 mg | Freq: Once | INTRAVENOUS | Status: AC
  Administered 2020-12-18: 500 mg via INTRAVENOUS
  Filled 2020-12-18: qty 100

## 2020-12-18 NOTE — Discharge Instructions (Addendum)
You are leaving the emergency department [facility].  You have sepsis from an unknown origin.  You refused your CT scan.  Your platelet count is 18 which is severely low.  This is dangerous and is a major bleeding risk.  You are tachycardic. We have discussed the risk of leaving. For antibiotic coverage, you have been prescribed keflex and doxycycline to take as prescribed. If you have any concern, new or worsening symptoms, please return to the nearest ER.

## 2020-12-18 NOTE — ED Notes (Signed)
Pt leaving AMA. 

## 2020-12-18 NOTE — ED Provider Notes (Signed)
[facility] EMERGENCY DEPARTMENT Provider Note   CSN: [id number] Arrival date & time: 12/18/20  1339     History Chief Complaint  Patient presents with   Vomiting    [name] is a 65 y.o. female receiving chemotherapy for stage IV small cell lung cancer presents to the emergency department for evaluation of flulike symptoms for the past 4 days.  Patient reports she had a fever (T-max 102.17F), nausea, one episode of vomiting, runny nose, nasal ingestion, sore throat, cough, fatigue.  Additionally, she reports her abdominal pain started around yesterday to today.  She reports it is mainly in her lower abdominal, mainly in the left lower quadrant and feels stabbing in nature.  She reports she is feeling short of breath but it is at her baseline.  Denies any chest pain.  Additional medical history includes borderline diabetic, blood pressure, thyroid, dyslipidemia, GERD.  No recent surgeries.  Allergic to penicillin, codeine, sulfa, NSAIDs, and metformin.  HPI     Past Medical History:  Diagnosis Date   Allergic rhinitis    Anemia    Anxiety    Chicken pox    Chronic back pain    COPD (chronic obstructive pulmonary disease) (HCC)    Depression    DM (diabetes mellitus) ([place])    Essential hypertension    GERD (gastroesophageal reflux disease)    Headache    migraines   History of gastritis    EGD 2015   History of home oxygen therapy    2 liters at hs last 6 months   Hyperlipidemia    Hypothyroidism    Migraines    Osteoarthritis    oa   Scoliosis     Patient Active Problem List   Diagnosis Date Noted   Nocturnal hypoxemia 11/27/2020   Small cell lung cancer ([place]) 10/27/2020   Pulmonary nodule 08/02/2020   Tobacco abuse 07/13/2020   Arthritis of carpometacarpal (CMC) joint of both thumbs 05/29/2020   Diabetes mellitus, new onset ([place]) 04/12/2019   Tremor 11/05/2018   Chronic respiratory failure with hypoxia ([place]) 01/12/2018   GERD with esophagitis 11/26/2017    Dysphagia 08/20/2017   Chronic pain syndrome 08/04/2017   Comorbid sleep-related hypoventilation 08/04/2017   OSA (obstructive sleep apnea) 08/04/2017   Hepatic steatosis 08/04/2017   Polyp of ascending colon    Diverticulosis of colon without hemorrhage    Gastritis and gastroduodenitis    Hypothyroidism 07/22/2017   Anxiety and depression 07/22/2017   Iron deficiency anemia 07/22/2017   Pilar cyst 07/22/2017   Morbid obesity due to excess calories ([place]) 03/19/2017   Anemia 03/19/2017   Chronic obstructive pulmonary disease ([place]) 03/18/2017   Degenerative lumbar spinal stenosis 02/17/2017   S/P right hip revision 09/02/2016   S/P hip replacement 09/02/2016   Instability of prosthetic hip ([place]) 03/01/2016   Dislocation of hip prosthesis ([place]) 02/17/2016   Abnormal nuclear stress test: Intermediate Risk 10/31/2015   Atypical angina ([place]) 10/31/2015   Trigger finger, acquired 08/20/2012   Cigarette smoker 12/12/2008   Hyperlipidemia 11/17/2008   HYPERTENSION, BENIGN 11/16/2008   Primary osteoarthritis involving multiple joints 11/16/2008    Past Surgical History:  Procedure Laterality Date   APPENDECTOMY     1985   BIOPSY  07/24/2017   Procedure: BIOPSY;  Surgeon: Milus Banister, MD;  Location: [facility];  Service: Endoscopy;;   CARDIAC CATHETERIZATION N/A 10/31/2015   Procedure: Left Heart Cath and Coronary Angiography;  Surgeon: Leonie Man, MD;  Location: [facility]  INVASIVE CV LAB;  Service: Cardiovascular;  Laterality: N/A;   CARPAL TUNNEL RELEASE Left    CARPAL TUNNEL RELEASE Right    CHOLECYSTECTOMY  late 1980's   COLONOSCOPY WITH PROPOFOL N/A 07/24/2017   Procedure: COLONOSCOPY WITH PROPOFOL;  Surgeon: Milus Banister, MD;  Location: [facility];  Service: Endoscopy;  Laterality: N/A;   ESOPHAGOGASTRODUODENOSCOPY N/A 07/24/2017   Procedure: ESOPHAGOGASTRODUODENOSCOPY (EGD);  Surgeon: Milus Banister, MD;  Location: [facility];  Service: Endoscopy;  Laterality:  N/A;   ESOPHAGOGASTRODUODENOSCOPY (EGD) WITH PROPOFOL N/A 10/19/2020   Procedure: ESOPHAGOGASTRODUODENOSCOPY (EGD) WITH PROPOFOL;  Surgeon: Milus Banister, MD;  Location: [facility];  Service: Endoscopy;  Laterality: N/A;   FINE NEEDLE ASPIRATION N/A 10/19/2020   Procedure: FINE NEEDLE ASPIRATION (FNA) LINEAR;  Surgeon: Milus Banister, MD;  Location: [facility];  Service: Endoscopy;  Laterality: N/A;   GALLBLADDER SURGERY  1991   HIP CLOSED REDUCTION Right 01/08/2016   Procedure: CLOSED MANIPULATION HIP;  Surgeon: Susa Day, MD;  Location: [facility];  Service: Orthopedics;  Laterality: Right;   HIP CLOSED REDUCTION Right 01/19/2016   Procedure: ATTEMPTED CLOSED REDUCTION RIGHT HIP;  Surgeon: Wylene Simmer, MD;  Location: [facility];  Service: Orthopedics;  Laterality: Right;   HIP CLOSED REDUCTION Right 01/20/2016   Procedure: CLOSED REDUCTION RIGHT TOTAL HIP;  Surgeon: Paralee Cancel, MD;  Location: [facility];  Service: Orthopedics;  Laterality: Right;   HIP CLOSED REDUCTION Right 02/17/2016   Procedure: CLOSED REDUCTION RIGHT TOTAL HIP;  Surgeon: Rod Can, MD;  Location: [facility];  Service: Orthopedics;  Laterality: Right;   HIP CLOSED REDUCTION Right 02/28/2016   Procedure: CLOSED REDUCTION HIP;  Surgeon: Nicholes Stairs, MD;  Location: [facility];  Service: Orthopedics;  Laterality: Right;   IR IMAGING GUIDED PORT INSERTION  11/01/2020   POLYPECTOMY  07/24/2017   Procedure: POLYPECTOMY;  Surgeon: Milus Banister, MD;  Location: [facility];  Service: Endoscopy;;   [place], with 1 ovary removed and 2 nd ovary removed 2003   TOTAL HIP ARTHROPLASTY Right    Original surgery 2006 with revision 2010   TOTAL HIP REVISION Right 01/01/2016   Procedure: TOTAL HIP REVISION;  Surgeon: Paralee Cancel, MD;  Location: [facility];  Service: Orthopedics;  Laterality: Right;   TOTAL HIP REVISION Right 03/02/2016   Procedure: TOTAL HIP REVISION;  Surgeon: Paralee Cancel, MD;  Location: [facility];  Service: Orthopedics;  Laterality: Right;   TOTAL HIP REVISION Right 09/02/2016   Procedure: Right hip constrained liner- posterior;  Surgeon: Paralee Cancel, MD;  Location: [facility];  Service: Orthopedics;  Laterality: Right;   ULNAR NERVE TRANSPOSITION Right    UPPER ESOPHAGEAL ENDOSCOPIC ULTRASOUND (EUS) N/A 10/19/2020   Procedure: UPPER ESOPHAGEAL ENDOSCOPIC ULTRASOUND (EUS);  Surgeon: Milus Banister, MD;  Location: [facility];  Service: Endoscopy;  Laterality: N/A;  periduodenal lesion     OB History   No obstetric history on file.     Family History  Problem Relation Age of Onset   COPD Mother    Heart disease Mother    Lung disease Father        Asbestosis   Heart attack Father    Heart disease Father    Cerebral aneurysm Brother    Aneurysm Brother        Brain   Drug abuse Daughter    Epilepsy Son    Alcohol abuse Son    Drug  abuse Son    Arthritis Maternal Grandmother    Heart disease Maternal Grandmother    Asthma Maternal Grandfather    Cancer Maternal Grandfather    Arthritis Paternal Grandmother    Heart disease Paternal Grandmother    Stroke Paternal Grandmother    Early death Paternal Grandfather    Heart disease Paternal Grandfather     Social History   Tobacco Use   Smoking status: Every Day    Packs/day: 2.00    Years: 46.00    Pack years: 92.00    Types: Cigarettes   Smokeless tobacco: Never   Tobacco comments:    Currently smoking 1.5ppd as of 11/10/20  Vaping Use   Vaping Use: Never used  Substance Use Topics   Alcohol use: No   Drug use: No    Home Medications Prior to Admission medications   Medication Sig Start Date End Date Taking? Authorizing Provider  cephALEXin (KEFLEX) 500 MG capsule Take 1 capsule (500 mg total) by mouth 4 (four) times daily. 12/18/20  Yes Sherrell Puller, PA-C  doxycycline (VIBRAMYCIN) 100 MG capsule Take 1 capsule (100 mg total) by mouth 2 (two) times daily. 12/18/20  Yes  Sherrell Puller, PA-C  albuterol [facility]) 108 [phone] Base) MCG/ACT inhaler 2 puffs every 4 hours as needed only  if your can't catch your breath 06/20/20   Margaretha Seeds, MD  ALPRAZolam Duanne Moron) 1 MG tablet Take 1 tablet po TID as needed for anxiety. 11/27/20   Allwardt, Randa Evens, PA-C  aspirin EC 81 MG tablet Take 1 tablet (81 mg total) by mouth daily. Swallow whole. 09/13/20   O'Neal, Cassie Freer, MD  Aspirin-Acetaminophen-Caffeine (GOODY HEADACHE PO) Take 1 packet by mouth 3 (three) times daily as needed (headaches.).    [provider]  atorvastatin (LIPITOR) 20 MG tablet Take 1 tablet (20 mg total) by mouth at bedtime. 08/23/20   Allwardt, Randa Evens, PA-C  cyclobenzaprine (FLEXERIL) 10 MG tablet Take 1 tablet (10 mg total) by mouth 3 (three) times daily as needed for muscle spasms. 05/29/20   Lovorn, Jinny Blossom, MD  dicyclomine (BENTYL) 10 MG capsule Take 1 capsule (10 mg total) by mouth 2 (two) times daily as needed for spasms. 06/14/20   Noralyn Pick, NP  DULoxetine (CYMBALTA) 60 MG capsule Take 2 capsules (120 mg total) by mouth daily. Patient taking differently: Take 120 mg by mouth at bedtime. 07/28/20   Lovorn, Jinny Blossom, MD  fluticasone (FLONASE) 50 MCG/ACT nasal spray Place 2 sprays into both nostrils daily. 07/27/20   Allwardt, Randa Evens, PA-C  Fluticasone-Umeclidin-Vilant (TRELEGY ELLIPTA) 200-62.5-25 MCG/INH AEPB Inhale 1 puff into the lungs daily. 11/10/20   Margaretha Seeds, MD  furosemide (LASIX) 20 MG tablet TAKE 1 TABLET BY MOUTH EVERY DAY AS NEEDED 01/25/20   Raiford Noble C, PA-C  glucose blood test strip Use as instructed 05/19/20   Inda Coke, PA  HYDROcodone-acetaminophen (NORCO) 10-325 MG tablet TAKE 1 TABLET BY MOUTH EVERY 6 HOURS AS NEEDED 11/30/20   Lovorn, Jinny Blossom, MD  levothyroxine (SYNTHROID) 150 MCG tablet TAKE 1 TABLET BY MOUTH DAILY BEFORE breakfast 10/03/20   Lovorn, Jinny Blossom, MD  Lidocaine 4 % PTCH Place 1 patch onto the skin daily as needed (pain.).     [provider]  lidocaine-prilocaine (EMLA) cream Apply 1 application topically as needed. 10/27/20   Orson Slick, MD  loratadine (CLARITIN) 10 MG tablet Take 10 mg by mouth daily.    [provider]  omeprazole ([place])  40 MG capsule TAKE ONE CAPSULE BY MOUTH TWICE DAILY 12/04/20   Noralyn Pick, NP  ondansetron (ZOFRAN) 8 MG tablet Take 1 tablet (8 mg total) by mouth every 8 (eight) hours as needed. 10/27/20   Orson Slick, MD  OXYGEN Inhale 2 L into the lungs at bedtime. Uses At Night    [provider]  potassium chloride (KLOR-CON) 10 MEQ tablet Take 1 tablet (10 mEq total) by mouth at bedtime. 08/21/20   Allwardt, Randa Evens, PA-C  prochlorperazine (COMPAZINE) 10 MG tablet Take 1 tablet (10 mg total) by mouth every 6 (six) hours as needed for nausea or vomiting. 10/27/20   Orson Slick, MD  triamterene-hydrochlorothiazide (MAXZIDE-25) 37.5-25 MG tablet Take 1 tablet by mouth daily. 05/24/20   Allwardt, Randa Evens, PA-C    Allergies    Metformin and related, Nsaids, Aleve [naproxen sodium], Codeine, Penicillins, and Sulfonamide derivatives  Review of Systems   Review of Systems  Constitutional:  Positive for chills and fever.  HENT:  Positive for congestion, rhinorrhea and sore throat. Negative for drooling, ear pain and trouble swallowing.   Eyes:  Negative for photophobia, discharge and visual disturbance.  Respiratory:  Positive for cough. Negative for shortness of breath.   Cardiovascular:  Negative for chest pain and palpitations.  Gastrointestinal:  Negative for abdominal pain, diarrhea, nausea and vomiting.  Genitourinary:  Negative for dysuria and hematuria.  Musculoskeletal:  Positive for myalgias. Negative for arthralgias, back pain and joint swelling.  Skin:  Negative for color change and rash.  Neurological:  Negative for syncope and weakness.   Physical Exam Updated Vital Signs BP 113/62   Pulse (!) 117   Temp (!) 103.9  F (39.9 C) (Rectal)   Resp (!) 29   Ht _0  (1.575 m)   Wt 90.7 kg   SpO2 95%   BMI 36.58 kg/m   Physical Exam Vitals and nursing note reviewed.  Constitutional:      General: She is not in acute distress.    Appearance: She is normal weight. She is not toxic-appearing.     Comments: pale  HENT:     Head: Normocephalic and atraumatic.     Right Ear: Tympanic membrane, ear canal and external ear normal.     Left Ear: Tympanic membrane, ear canal and external ear normal.     Nose: Nose normal.     Mouth/Throat:     Mouth: Mucous membranes are moist.     Pharynx: No oropharyngeal exudate or posterior oropharyngeal erythema.     Comments: No thrush noted.  No pharyngeal erythema, edema, or exudate noted.  Uvula midline.  Airway patent. Eyes:     General: No scleral icterus.    Conjunctiva/sclera: Conjunctivae normal.  Cardiovascular:     Rate and Rhythm: Normal rate and regular rhythm.  Pulmonary:     Effort: Pulmonary effort is normal. No respiratory distress.     Breath sounds: Normal breath sounds.  Abdominal:     General: Abdomen is flat. Bowel sounds are normal.     Palpations: Abdomen is soft.     Tenderness: There is abdominal tenderness. There is no guarding or rebound.     Comments: Patient has tenderness to her left upper quadrant, left lower quadrant, and suprapubic region.  No guarding or rebound noted.  Normoactive bowel sounds.  Musculoskeletal:        General: No deformity.     Cervical back: Normal range of motion.  Skin:    General: Skin is warm and dry.     Findings: No rash.  Neurological:     General: No focal deficit present.     Mental Status: She is alert. Mental status is at baseline.     Motor: No weakness.    ED Results / Procedures / Treatments   Labs (all labs ordered are listed, but only abnormal results are displayed) Labs Reviewed  COMPREHENSIVE METABOLIC PANEL - Abnormal; Notable for the following components:      Result Value    Sodium 127 (*)    Potassium 3.2 (*)    Chloride 96 (*)    CO2 21 (*)    Glucose, Bld 179 (*)    Creatinine, Ser 1.03 (*)    Calcium 7.5 (*)    Total Protein 5.8 (*)    Albumin 2.7 (*)    Alkaline Phosphatase 152 (*)    All other components within normal limits  URINALYSIS, ROUTINE W REFLEX MICROSCOPIC - Abnormal; Notable for the following components:   APPearance HAZY (*)    Hgb urine dipstick MODERATE (*)    Protein, ur 100 (*)    Nitrite POSITIVE (*)    Bacteria, UA RARE (*)    All other components within normal limits  LACTIC ACID, PLASMA - Abnormal; Notable for the following components:   Lactic Acid, Venous 2.0 (*)    All other components within normal limits  CBC WITH DIFFERENTIAL/PLATELET - Abnormal; Notable for the following components:   WBC 19.8 (*)    Hemoglobin 11.1 (*)    HCT 32.6 (*)    RDW 19.2 (*)    Platelets 18 (*)    nRBC 0.4 (*)    Neutro Abs 17.2 (*)    All other components within normal limits  RESP PANEL BY RT-PCR (FLU A&B, COVID) ARPGX2  CULTURE, BLOOD (ROUTINE X 2)  CULTURE, BLOOD (ROUTINE X 2)  LIPASE, BLOOD  LACTIC ACID, PLASMA  PROTIME-INR  APTT  CBC WITH DIFFERENTIAL/PLATELET  PATHOLOGIST SMEAR REVIEW  TYPE AND SCREEN    EKG None  Radiology DG Chest 2 View  Result Date: 12/18/2020 CLINICAL DATA:  Shortness of breath with weakness.  Fever. EXAM: CHEST - 2 VIEW COMPARISON:  03/05/2019 FINDINGS: Right-sided chest port in place with distal tip terminating at the level of the distal SVC. The heart size and mediastinal contours are within normal limits. Atherosclerotic calcification of the aortic knob. No focal airspace consolidation, pleural effusion, or pneumothorax. The visualized skeletal structures are unremarkable. IMPRESSION: No active cardiopulmonary disease. Electronically Signed   By: Davina Poke D.O.   On: 12/18/2020 15:33    Procedures Procedures   Medications Ordered in ED Medications  lactated ringers infusion (0 mLs  Intravenous Stopped 12/18/20 1824)  ceFEPIme (MAXIPIME) 2 g in sodium chloride 0.9 % 100 mL IVPB (0 g Intravenous Stopped 12/18/20 1800)  vancomycin (VANCOREADY) IVPB 2000 mg/400 mL (0 mg Intravenous Stopped 12/18/20 1824)    Followed by  vancomycin (VANCOREADY) IVPB 1500 mg/300 mL (has no administration in time range)  iohexol (OMNIPAQUE) 300 MG/ML solution 100 mL (has no administration in time range)  ondansetron (ZOFRAN) injection 4 mg (4 mg Intravenous Given 12/18/20 1536)  acetaminophen (TYLENOL) tablet 1,000 mg (1,000 mg Oral Given 12/18/20 1615)  lactated ringers bolus 1,000 mL (0 mLs Intravenous Stopped 12/18/20 1824)    And  lactated ringers bolus 600 mL (0 mLs Intravenous Stopped 12/18/20 1801)  metroNIDAZOLE (FLAGYL) IVPB 500 mg (0 mg Intravenous  Stopped 12/18/20 1800)    ED Course  I have reviewed the triage vital signs and the nursing notes.  Pertinent labs & imaging results that were available during my care of the patient were reviewed by me and considered in my medical decision making (see chart for details).  65 year old female presents to the emergency department with a septic picture.  Patient reports he has fever-like symptoms.  Initially, patient presented with 99.9 degree temperature, when rectal temperature ordered, came back at 100.9 Fahrenheit.  Patient has remained tachycardic and tachypneic, but is satting well on room air.  Normotensive.  Patient having left lower quadrant abdominal pain.  Labs ordered.  Personally reviewed the patient's labs and imaging.  Her CMP shows multiple electrolyte abnormalities.  The patient is hyponatremic, hypokalemic, with low chloride, low CO2.  Patient has mildly elevated glucose at 179.  Creatinine is 1.03 up from 0.69.  Indicative of an AKI.  Albumin and protein level.  Alk phos mildly elevated.  Her CBC shows leukocytosis with a large left shift.  Patient mildly anemic but close to patient's baseline.  CBC pertinent for severe  thrombocytopenia at 18.  Patient's previous lab from 2 weeks ago showed a platelet count of 326.  Lipase negative.  aPTT and PT/INR within normal limits.  Respiratory panel negative for COVID and flu.  Blood cultures pending.  Type and screen obtained.  Lactic acid was 2.0 and on repeat was 1.5.  Patient left after the urinalysis resulted.  Urine positive for nitrites and bacteria with a white blood cell count of 21-50.  Negative leukocytes.  No ketones.  The patient's left lower quadrant pain and still no source of infection, CT abdomen ordered at this time.  I consulted the hospitalist and oncologist.  Oncologist recommended that for patients with a septic picture the recommended platelets being over 20.  Dr. Burr Medico, oncologist, also size of the white blood cell count may be falsely high due to one of the patient's injections she received on 12-08-2020 although patient does have a high fever.  Salted with the hospitalist, Dr. Marily Memos, who accepted admission.  When discussing this with patient, she declined admission.  I discussed with her the risks of going [place] such as bleeding, infection, and ultimately death.  The patient does not want to be admitted and wants to be sent home.  I discussed with her her low platelet counts, her electrolyte abnormalities, and her vital signs consistent with sepsis.  I informed her that it is important that we obtain his abdominal CT as we do not know what the source of her infection is.  The patient is adamant about going home and refuses the abdominal CT.  At this time, my attending at bedside and advised to stay.  The patient, again, was adamant about leaving.  I informed the patient she was leaving in critical condition.  AMA paperwork filled out.  I prescribed her Keflex and doxycycline for broad-spectrum coverage.   Date: 12/18/2020 Patient: [name] Admitted: 12/18/2020  1:52 PM Attending Provider: Hiram Comber or her  authorized caregiver has made the decision for the patient to leave the emergency department against the advice of No att. providers found.  She or her authorized caregiver has been informed and understands the inherent risks, including death.  She or her authorized caregiver has decided to accept the responsibility for this decision. [name] and all necessary parties have been advised that she may return  for further evaluation or treatment. Her condition at time of discharge was Critical.  [name] had current vital signs as follows:  Blood pressure 113/62, pulse (!) 117, temperature (!) 103.9 F (39.9 C), temperature source Rectal, resp. rate (!) 29, height _0  (1.575 m), weight 90.7 kg, SpO2 95 %.   [name] or her authorized caregiver has signed the Leaving Against Medical Advice form prior to leaving the department.  Sherrell Puller 12/18/2020      MDM Rules/Calculators/A&P                          Final Clinical Impression(s) / ED Diagnoses Final diagnoses:  Left against medical advice  Thrombocytopenia ([place])  Sepsis, due to unspecified organism, unspecified whether acute organ dysfunction present ([place])  Tachycardia    Rx / DC Orders ED Discharge Orders          Ordered    doxycycline (VIBRAMYCIN) 100 MG capsule  2 times daily        12/18/20 1706    cephALEXin (KEFLEX) 500 MG capsule  4 times daily        12/18/20 1706             Sherrell Puller, [place] 12/18/20 1851    Varney Biles, MD 12/19/20 1133

## 2020-12-18 NOTE — Telephone Encounter (Signed)
Received vm message from pt stating she has been very sick over the weekend with fevers (Tmax 102.3)  C/o headache, nausea, muscle aches, fatigue, weakness.  TCT patient this morning . She states she feels no better. Also c/o sore throat , sneezing. Asked pt is she called any provider over the weekend. She states she did not. Advised that she go to ED immediately-she could have flu, Covid or symptoms could be related to receiving her lung cancer treatment that can affect her blood counts. Pt said she was too tired to go. Strongly advised that she go now and have her husband take her. She states she is very weak. Advised that if she could not go by car that she will need to call 911 for transport to ED. She voiced understanding and asked if it was ok to go to [facility] ED. Advised that it was fine to go there. Emphasized again that she needs to go today, as soon as possible.  She voiced understanding  Dr. Lorenso Courier made aware.

## 2020-12-18 NOTE — ED Notes (Signed)
Date and time results received: 12/18/20 1555   Test: Platelets Critical Value: 18  Name of Provider Notified: PA Ransom  Orders Received? Or Actions Taken?: notified

## 2020-12-18 NOTE — Progress Notes (Signed)
Pharmacy Antibiotic Note  [name] is a 65 y.o. female admitted on 12/18/2020 with  unknown source .  Pharmacy has been consulted for Vancomycin and cefepime dosing.  Plan: Vancomycin 2000mg  IV loading dose, then 1500mg  IV q24h for AUC of 484 ( goal 400-550) Cefepime 2gm IV q12h F/U cxs and clinical progress Monitor V/S, labs and levels as indicated  Height: 5\' 2"  (157.5 cm) Weight: 90.7 kg (200 lb) IBW/kg (Calculated) : 50.1  Temp (24hrs), Avg:101.9 F (38.8 C), Min:99.9 F (37.7 C), Max:103.9 F (39.9 C)  Recent Labs  Lab 12/18/20 1420 12/18/20 1443 12/18/20 1445  WBC 19.8*  --   --   CREATININE  --  1.03*  --   LATICACIDVEN  --   --  2.0*    Estimated Creatinine Clearance: 57.8 mL/min (A) (by C-G formula based on SCr of 1.03 mg/dL (H)).    Allergies  Allergen Reactions   Metformin And Related Diarrhea   Nsaids Diarrhea   Aleve [Naproxen Sodium] Other (See Comments)    Headache    Codeine Nausea Only and Other (See Comments)    GI upset   Penicillins Nausea Only and Other (See Comments)    GI upset    Sulfonamide Derivatives Hives    Antimicrobials this admission: Vancomycin 11/21 >>  cefepime 11/21 >>   Microbiology results: 11/21 BCx: pending  UCx: pending   MRSA PCR:   Thank you for allowing pharmacy to be a part of this patient's care.  Isac Sarna, BS [place], [place] Clinical Pharmacist Pager [phone] 12/18/2020 4:00 PM

## 2020-12-18 NOTE — ED Notes (Signed)
Pt has refused CT and PA [facility] informed.

## 2020-12-18 NOTE — Sepsis Progress Note (Signed)
Elink is following this code sepsis ?

## 2020-12-18 NOTE — ED Triage Notes (Signed)
Pt brought in by [facility] from home with c/o vomiting and weakness x 3 days. BP 140/78, HR 142, Temp of 101 for EMS. Chemo pt.

## 2020-12-19 ENCOUNTER — Encounter (HOSPITAL_COMMUNITY): Payer: Self-pay | Admitting: *Deleted

## 2020-12-19 ENCOUNTER — Emergency Department (HOSPITAL_COMMUNITY): Payer: Medicare HMO

## 2020-12-19 ENCOUNTER — Inpatient Hospital Stay (HOSPITAL_COMMUNITY)
Admission: EM | Admit: 2020-12-19 | Discharge: 2020-12-22 | DRG: 872 | Disposition: A | Discharge status: Home / Self Care | Payer: Medicare HMO | Attending: Internal Medicine | Admitting: Internal Medicine

## 2020-12-19 ENCOUNTER — Other Ambulatory Visit: Payer: Self-pay

## 2020-12-19 ENCOUNTER — Telehealth: Payer: Self-pay | Admitting: Medical Oncology

## 2020-12-19 DIAGNOSIS — C3490 Malignant neoplasm of unspecified part of unspecified bronchus or lung: Secondary | ICD-10-CM | POA: Diagnosis not present

## 2020-12-19 DIAGNOSIS — Z885 Allergy status to narcotic agent status: Secondary | ICD-10-CM

## 2020-12-19 DIAGNOSIS — G4733 Obstructive sleep apnea (adult) (pediatric): Secondary | ICD-10-CM | POA: Diagnosis present

## 2020-12-19 DIAGNOSIS — Z8249 Family history of ischemic heart disease and other diseases of the circulatory system: Secondary | ICD-10-CM | POA: Diagnosis not present

## 2020-12-19 DIAGNOSIS — Z825 Family history of asthma and other chronic lower respiratory diseases: Secondary | ICD-10-CM

## 2020-12-19 DIAGNOSIS — Z20822 Contact with and (suspected) exposure to covid-19: Secondary | ICD-10-CM | POA: Diagnosis not present

## 2020-12-19 DIAGNOSIS — Z79899 Other long term (current) drug therapy: Secondary | ICD-10-CM

## 2020-12-19 DIAGNOSIS — Z96641 Presence of right artificial hip joint: Secondary | ICD-10-CM | POA: Diagnosis present

## 2020-12-19 DIAGNOSIS — Z823 Family history of stroke: Secondary | ICD-10-CM

## 2020-12-19 DIAGNOSIS — R32 Unspecified urinary incontinence: Secondary | ICD-10-CM | POA: Diagnosis present

## 2020-12-19 DIAGNOSIS — D696 Thrombocytopenia, unspecified: Secondary | ICD-10-CM | POA: Diagnosis present

## 2020-12-19 DIAGNOSIS — E039 Hypothyroidism, unspecified: Secondary | ICD-10-CM | POA: Diagnosis present

## 2020-12-19 DIAGNOSIS — Z7982 Long term (current) use of aspirin: Secondary | ICD-10-CM

## 2020-12-19 DIAGNOSIS — E785 Hyperlipidemia, unspecified: Secondary | ICD-10-CM | POA: Diagnosis present

## 2020-12-19 DIAGNOSIS — A4151 Sepsis due to Escherichia coli [E. coli]: Secondary | ICD-10-CM | POA: Diagnosis not present

## 2020-12-19 DIAGNOSIS — E119 Type 2 diabetes mellitus without complications: Secondary | ICD-10-CM | POA: Diagnosis not present

## 2020-12-19 DIAGNOSIS — E876 Hypokalemia: Secondary | ICD-10-CM | POA: Diagnosis not present

## 2020-12-19 DIAGNOSIS — Z8261 Family history of arthritis: Secondary | ICD-10-CM | POA: Diagnosis not present

## 2020-12-19 DIAGNOSIS — F411 Generalized anxiety disorder: Secondary | ICD-10-CM | POA: Diagnosis present

## 2020-12-19 DIAGNOSIS — F32A Depression, unspecified: Secondary | ICD-10-CM | POA: Diagnosis present

## 2020-12-19 DIAGNOSIS — I517 Cardiomegaly: Secondary | ICD-10-CM | POA: Diagnosis not present

## 2020-12-19 DIAGNOSIS — J9611 Chronic respiratory failure with hypoxia: Secondary | ICD-10-CM | POA: Diagnosis not present

## 2020-12-19 DIAGNOSIS — Z886 Allergy status to analgesic agent status: Secondary | ICD-10-CM | POA: Diagnosis not present

## 2020-12-19 DIAGNOSIS — A419 Sepsis, unspecified organism: Secondary | ICD-10-CM

## 2020-12-19 DIAGNOSIS — Z79890 Hormone replacement therapy (postmenopausal): Secondary | ICD-10-CM

## 2020-12-19 DIAGNOSIS — I10 Essential (primary) hypertension: Secondary | ICD-10-CM | POA: Diagnosis not present

## 2020-12-19 DIAGNOSIS — N390 Urinary tract infection, site not specified: Secondary | ICD-10-CM | POA: Diagnosis not present

## 2020-12-19 DIAGNOSIS — Z9981 Dependence on supplemental oxygen: Secondary | ICD-10-CM | POA: Diagnosis not present

## 2020-12-19 DIAGNOSIS — Z880 Allergy status to penicillin: Secondary | ICD-10-CM | POA: Diagnosis not present

## 2020-12-19 DIAGNOSIS — R0602 Shortness of breath: Secondary | ICD-10-CM | POA: Diagnosis not present

## 2020-12-19 DIAGNOSIS — F17210 Nicotine dependence, cigarettes, uncomplicated: Secondary | ICD-10-CM | POA: Diagnosis present

## 2020-12-19 LAB — BLOOD CULTURE ID PANEL (REFLEXED) - BCID2

## 2020-12-19 LAB — COMPREHENSIVE METABOLIC PANEL
ALT: 26 U/L (ref 0–44)
AST: 29 U/L (ref 15–41)
Albumin: 2.7 g/dL — ABNORMAL LOW (ref 3.5–5.0)
Alkaline Phosphatase: 160 U/L — ABNORMAL HIGH (ref 38–126)
Anion gap: 10 (ref 5–15)
BUN: 11 mg/dL (ref 8–23)
CO2: 23 mmol/L (ref 22–32)
Calcium: 7.5 mg/dL — ABNORMAL LOW (ref 8.9–10.3)
Chloride: 96 mmol/L — ABNORMAL LOW (ref 98–111)
Creatinine, Ser: 1.1 mg/dL — ABNORMAL HIGH (ref 0.44–1.00)
GFR, Estimated: 56 mL/min — ABNORMAL LOW (ref >60)
Glucose, Bld: 108 mg/dL — ABNORMAL HIGH (ref 70–99)
Potassium: 2.9 mmol/L — ABNORMAL LOW (ref 3.5–5.1)
Sodium: 129 mmol/L — ABNORMAL LOW (ref 135–145)
Total Bilirubin: 1 mg/dL (ref 0.3–1.2)
Total Protein: 6 g/dL — ABNORMAL LOW (ref 6.5–8.1)

## 2020-12-19 LAB — CBC WITH DIFFERENTIAL/PLATELET
Band Neutrophils: 6 %
Basophils Absolute: 0 10*3/uL (ref 0.0–0.1)
Basophils Relative: 0 %
Eosinophils Absolute: 0 10*3/uL (ref 0.0–0.5)
Eosinophils Relative: 0 %
HCT: 33.9 % — ABNORMAL LOW (ref 36.0–46.0)
Hemoglobin: 11.2 g/dL — ABNORMAL LOW (ref 12.0–15.0)
Lymphocytes Relative: 9 %
Lymphs Abs: 1.7 10*3/uL (ref 0.7–4.0)
MCH: 27 pg (ref 26.0–34.0)
MCHC: 33 g/dL (ref 30.0–36.0)
MCV: 81.7 fL (ref 80.0–100.0)
Monocytes Absolute: 1 10*3/uL (ref 0.1–1.0)
Monocytes Relative: 5 %
Myelocytes: 2 %
Neutro Abs: 16.2 10*3/uL — ABNORMAL HIGH (ref 1.7–7.7)
Neutrophils Relative %: 78 %
Platelets: 21 10*3/uL — CL (ref 150–400)
RBC: 4.15 MIL/uL (ref 3.87–5.11)
RDW: 19.5 % — ABNORMAL HIGH (ref 11.5–15.5)
WBC: 19.3 10*3/uL — ABNORMAL HIGH (ref 4.0–10.5)
nRBC: 0 % (ref 0.0–0.2)
nRBC: 1 /100 WBC — ABNORMAL HIGH

## 2020-12-19 LAB — D-DIMER, QUANTITATIVE: D-Dimer, Quant: 2.85 ug/mL-FEU — ABNORMAL HIGH (ref 0.00–0.50)

## 2020-12-19 LAB — RESP PANEL BY RT-PCR (FLU A&B, COVID) ARPGX2
Influenza A by PCR: NEGATIVE
Influenza B by PCR: NEGATIVE
SARS Coronavirus 2 by RT PCR: NEGATIVE

## 2020-12-19 LAB — LACTIC ACID, PLASMA
Lactic Acid, Venous: 2.2 mmol/L (ref 0.5–1.9)
Lactic Acid, Venous: 1.8 mmol/L (ref 0.5–1.9)

## 2020-12-19 LAB — PATHOLOGIST SMEAR REVIEW

## 2020-12-19 LAB — PROTIME-INR
INR: 1.1 (ref 0.8–1.2)
Prothrombin Time: 14.6 seconds (ref 11.4–15.2)

## 2020-12-19 LAB — TYPE AND SCREEN
ABO/RH(D): O NEG
ABO/RH(D): O NEG
Antibody Screen: NEGATIVE
Antibody Screen: NEGATIVE

## 2020-12-19 LAB — MAGNESIUM: Magnesium: 1.2 mg/dL — ABNORMAL LOW (ref 1.7–2.4)

## 2020-12-19 LAB — APTT: aPTT: 32 seconds (ref 24–36)

## 2020-12-19 MED ORDER — ACETAMINOPHEN 325 MG PO TABS
650.0000 mg | ORAL_TABLET | Freq: Once | ORAL | Status: AC
  Administered 2020-12-19: 650 mg via ORAL
  Filled 2020-12-19: qty 2

## 2020-12-19 MED ORDER — PANTOPRAZOLE SODIUM 40 MG PO TBEC
40.0000 mg | DELAYED_RELEASE_TABLET | Freq: Every day | ORAL | Status: DC
  Administered 2020-12-20 – 2020-12-22 (×3): 40 mg via ORAL
  Filled 2020-12-19 (×3): qty 1

## 2020-12-19 MED ORDER — SODIUM CHLORIDE 0.9 % IV SOLN
2.0000 g | Freq: Once | INTRAVENOUS | Status: AC
  Administered 2020-12-19: 2 g via INTRAVENOUS
  Filled 2020-12-19: qty 20

## 2020-12-19 MED ORDER — SODIUM CHLORIDE 0.9 % IV BOLUS (SEPSIS)
1000.0000 mL | Freq: Once | INTRAVENOUS | Status: AC
  Administered 2020-12-19: 1000 mL via INTRAVENOUS

## 2020-12-19 MED ORDER — ASPIRIN EC 81 MG PO TBEC
81.0000 mg | DELAYED_RELEASE_TABLET | Freq: Every day | ORAL | Status: DC
  Administered 2020-12-20 – 2020-12-22 (×3): 81 mg via ORAL
  Filled 2020-12-19 (×3): qty 1

## 2020-12-19 MED ORDER — TRIAMTERENE-HCTZ 37.5-25 MG PO TABS
1.0000 | ORAL_TABLET | Freq: Every day | ORAL | Status: DC
Start: 2020-12-20 — End: 2020-12-22
  Administered 2020-12-20 – 2020-12-22 (×3): 1 via ORAL
  Filled 2020-12-19 (×4): qty 1

## 2020-12-19 MED ORDER — OXYCODONE-ACETAMINOPHEN 5-325 MG PO TABS
2.0000 | ORAL_TABLET | Freq: Once | ORAL | Status: AC
  Administered 2020-12-19: 2 via ORAL
  Filled 2020-12-19: qty 2

## 2020-12-19 MED ORDER — HEPARIN SODIUM (PORCINE) 5000 UNIT/ML IJ SOLN: 5000.0000 [IU] | Freq: Three times a day (TID) | INTRAMUSCULAR | Status: DC

## 2020-12-19 MED ORDER — LACTATED RINGERS IV SOLN: INTRAVENOUS | Status: DC

## 2020-12-19 MED ORDER — DOCUSATE SODIUM 100 MG PO CAPS
100.0000 mg | ORAL_CAPSULE | Freq: Two times a day (BID) | ORAL | Status: DC
  Administered 2020-12-20 – 2020-12-22 (×4): 100 mg via ORAL
  Filled 2020-12-19 (×5): qty 1

## 2020-12-19 MED ORDER — DULOXETINE HCL 60 MG PO CPEP
120.0000 mg | ORAL_CAPSULE | Freq: Every day | ORAL | Status: DC
  Administered 2020-12-19 – 2020-12-21 (×3): 120 mg via ORAL
  Filled 2020-12-19 (×3): qty 2

## 2020-12-19 MED ORDER — CYCLOBENZAPRINE HCL 10 MG PO TABS
10.0000 mg | ORAL_TABLET | Freq: Three times a day (TID) | ORAL | Status: DC | PRN
  Administered 2020-12-20 – 2020-12-22 (×3): 10 mg via ORAL
  Filled 2020-12-19 (×3): qty 1

## 2020-12-19 MED ORDER — LEVOTHYROXINE SODIUM 75 MCG PO TABS
150.0000 ug | ORAL_TABLET | Freq: Every day | ORAL | Status: DC
  Administered 2020-12-20: 150 ug via ORAL
  Filled 2020-12-19: qty 2

## 2020-12-19 MED ORDER — LIDOCAINE 5 % EX PTCH
1.0000 | MEDICATED_PATCH | Freq: Every day | CUTANEOUS | Status: DC | PRN
Start: 2020-12-19 — End: 2020-12-22
  Administered 2020-12-21: 1 via TRANSDERMAL
  Filled 2020-12-19 (×2): qty 1

## 2020-12-19 MED ORDER — MAGNESIUM SULFATE IN D5W 1-5 GM/100ML-% IV SOLN
1.0000 g | Freq: Once | INTRAVENOUS | Status: AC
  Administered 2020-12-19: 1 g via INTRAVENOUS
  Filled 2020-12-19: qty 100

## 2020-12-19 MED ORDER — FLUTICASONE-UMECLIDIN-VILANT 200-62.5-25 MCG/ACT IN AEPB: 1.0000 | INHALATION_SPRAY | Freq: Every day | RESPIRATORY_TRACT | Status: DC

## 2020-12-19 MED ORDER — ALPRAZOLAM 1 MG PO TABS
1.0000 mg | ORAL_TABLET | Freq: Three times a day (TID) | ORAL | Status: DC | PRN
  Administered 2020-12-19 – 2020-12-22 (×7): 1 mg via ORAL
  Filled 2020-12-19: qty 1
  Filled 2020-12-19 (×2): qty 2
  Filled 2020-12-19: qty 1
  Filled 2020-12-19 (×3): qty 2

## 2020-12-19 MED ORDER — IOHEXOL 350 MG/ML SOLN
100.0000 mL | Freq: Once | INTRAVENOUS | Status: AC | PRN
  Administered 2020-12-19: 75 mL via INTRAVENOUS

## 2020-12-19 MED ORDER — SODIUM CHLORIDE 0.9% FLUSH
3.0000 mL | Freq: Two times a day (BID) | INTRAVENOUS | Status: DC
  Administered 2020-12-19 – 2020-12-22 (×6): 3 mL via INTRAVENOUS

## 2020-12-19 MED ORDER — ATORVASTATIN CALCIUM 20 MG PO TABS
20.0000 mg | ORAL_TABLET | Freq: Every day | ORAL | Status: DC
  Administered 2020-12-19 – 2020-12-21 (×3): 20 mg via ORAL
  Filled 2020-12-19 (×3): qty 1

## 2020-12-19 MED ORDER — UMECLIDINIUM BROMIDE 62.5 MCG/ACT IN AEPB
1.0000 | INHALATION_SPRAY | Freq: Every day | RESPIRATORY_TRACT | Status: DC
  Administered 2020-12-20 – 2020-12-22 (×3): 1 via RESPIRATORY_TRACT
  Filled 2020-12-19: qty 7

## 2020-12-19 MED ORDER — ACETAMINOPHEN 650 MG RE SUPP: 650.0000 mg | Freq: Four times a day (QID) | RECTAL | Status: DC | PRN

## 2020-12-19 MED ORDER — DULOXETINE HCL 60 MG PO CPEP: 120.0000 mg | ORAL_CAPSULE | Freq: Every day | ORAL | Status: DC

## 2020-12-19 MED ORDER — FLUTICASONE FUROATE-VILANTEROL 200-25 MCG/ACT IN AEPB
1.0000 | INHALATION_SPRAY | Freq: Every day | RESPIRATORY_TRACT | Status: DC
  Administered 2020-12-21 – 2020-12-22 (×2): 1 via RESPIRATORY_TRACT
  Filled 2020-12-19 (×2): qty 28

## 2020-12-19 MED ORDER — LORATADINE 10 MG PO TABS
10.0000 mg | ORAL_TABLET | Freq: Every day | ORAL | Status: DC
  Administered 2020-12-20 – 2020-12-22 (×3): 10 mg via ORAL
  Filled 2020-12-19 (×3): qty 1

## 2020-12-19 MED ORDER — ALBUTEROL SULFATE (2.5 MG/3ML) 0.083% IN NEBU: 3.0000 mL | INHALATION_SOLUTION | RESPIRATORY_TRACT | Status: DC | PRN

## 2020-12-19 MED ORDER — HYDROCODONE-ACETAMINOPHEN 10-325 MG PO TABS
1.0000 | ORAL_TABLET | Freq: Four times a day (QID) | ORAL | Status: DC | PRN
  Administered 2020-12-19 – 2020-12-22 (×8): 1 via ORAL
  Filled 2020-12-19 (×8): qty 1

## 2020-12-19 MED ORDER — ONDANSETRON HCL 4 MG PO TABS
8.0000 mg | ORAL_TABLET | Freq: Three times a day (TID) | ORAL | Status: DC | PRN
  Administered 2020-12-20: 8 mg via ORAL
  Filled 2020-12-19: qty 2

## 2020-12-19 MED ORDER — ONDANSETRON HCL 4 MG PO TABS: 4.0000 mg | ORAL_TABLET | Freq: Four times a day (QID) | ORAL | Status: DC | PRN

## 2020-12-19 MED ORDER — FLUTICASONE PROPIONATE 50 MCG/ACT NA SUSP
2.0000 | Freq: Every day | NASAL | Status: DC
  Administered 2020-12-20 – 2020-12-22 (×3): 2 via NASAL
  Filled 2020-12-19: qty 16

## 2020-12-19 MED ORDER — POTASSIUM CHLORIDE 10 MEQ/100ML IV SOLN
10.0000 meq | INTRAVENOUS | Status: AC
  Administered 2020-12-19 (×2): 10 meq via INTRAVENOUS
  Filled 2020-12-19 (×2): qty 100

## 2020-12-19 MED ORDER — ONDANSETRON HCL 4 MG/2ML IJ SOLN: 4.0000 mg | Freq: Four times a day (QID) | INTRAMUSCULAR | Status: DC | PRN

## 2020-12-19 MED ORDER — NICOTINE 14 MG/24HR TD PT24
14.0000 mg | MEDICATED_PATCH | Freq: Every day | TRANSDERMAL | Status: DC
  Administered 2020-12-21 – 2020-12-22 (×2): 14 mg via TRANSDERMAL
  Filled 2020-12-19 (×2): qty 1

## 2020-12-19 MED ORDER — SODIUM CHLORIDE 0.9 % IV SOLN
500.0000 mg | INTRAVENOUS | Status: DC
  Administered 2020-12-19 – 2020-12-20 (×2): 500 mg via INTRAVENOUS
  Filled 2020-12-19 (×2): qty 500

## 2020-12-19 MED ORDER — ACETAMINOPHEN 325 MG PO TABS
650.0000 mg | ORAL_TABLET | Freq: Four times a day (QID) | ORAL | Status: DC | PRN
  Administered 2020-12-20 – 2020-12-22 (×3): 650 mg via ORAL
  Filled 2020-12-19 (×3): qty 2

## 2020-12-19 MED ORDER — SODIUM CHLORIDE 0.9 % IV SOLN
2.0000 g | INTRAVENOUS | Status: DC
  Administered 2020-12-20 – 2020-12-21 (×2): 2 g via INTRAVENOUS
  Filled 2020-12-19 (×2): qty 20

## 2020-12-19 NOTE — Sepsis Progress Note (Signed)
eLink is following this Code Sepsis. °

## 2020-12-19 NOTE — ED Notes (Signed)
Pt states her doctor wants her admitted.

## 2020-12-19 NOTE — ED Provider Notes (Signed)
[facility] EMERGENCY DEPARTMENT Provider Note   CSN: [id number] Arrival date & time: 12/19/20  1212     History Chief Complaint  Patient presents with   Abnormal Lab    [name] is a 65 y.o. female.  HPI  Patient with significant medical history including stage IV  right lung small cell lung cancer currently on chemotherapy presents to the emergency department with chief complaint of generalized weakness, urinary symptoms and having a positive blood culture.  Patient notes that she came to the emergency department yesterday where they recommended that she get admitted for sepsis but left AMA.  She notes that she has been feeling weak for the last 4 days, she denies  nasal congestion sore throat cough stomach pain nausea vomiting diarrhea or general body aches.  She does note that she has been having urinary frequency, dysuria, a foul-smelling odor, she also has that she is having some bilateral lower back pain that just started today, pain does not radiate, remains in 1 area, describes as a sharp-like sensation.  No history of kidney stones, states she has history of kidney infections states this feels similar to this.  She states that she got her second round of chemotherapy last week is scheduled to get another one on Monday.  She denies any alleviating or aggravating factors.  After reviewing patient's chart he was seen here at [facility] yesterday, which shows patient had leukocytosis of 19.8, hemoglobin 11.1, and a platelet count of 17.  Also notes that patient had a UA which was positive for nitrates and rare bacteria, she had a slightly elevated lactic of 2.0, she was given broad-spectrum antibiotics and patient left AMA.  Notified today that blood cultures are positive for E. coli which are pansensitive.  Past Medical History:  Diagnosis Date   Allergic rhinitis    Anemia    Anxiety    Chicken pox    Chronic back pain    COPD (chronic obstructive pulmonary disease) (HCC)     Depression    DM (diabetes mellitus) ([place])    Essential hypertension    GERD (gastroesophageal reflux disease)    Headache    migraines   History of gastritis    EGD 2015   History of home oxygen therapy    2 liters at hs last 6 months   Hyperlipidemia    Hypothyroidism    Migraines    Osteoarthritis    oa   Scoliosis     Patient Active Problem List   Diagnosis Date Noted   E. coli sepsis ([place]) 12/19/2020   Nocturnal hypoxemia 11/27/2020   Small cell lung cancer ([place]) 10/27/2020   Pulmonary nodule 08/02/2020   Tobacco abuse 07/13/2020   Arthritis of carpometacarpal (CMC) joint of both thumbs 05/29/2020   Diabetes mellitus, new onset ([place]) 04/12/2019   Tremor 11/05/2018   Chronic respiratory failure with hypoxia ([place]) 01/12/2018   GERD with esophagitis 11/26/2017   Dysphagia 08/20/2017   Chronic pain syndrome 08/04/2017   Comorbid sleep-related hypoventilation 08/04/2017   OSA (obstructive sleep apnea) 08/04/2017   Hepatic steatosis 08/04/2017   Polyp of ascending colon    Diverticulosis of colon without hemorrhage    Gastritis and gastroduodenitis    Hypothyroidism 07/22/2017   Anxiety and depression 07/22/2017   Iron deficiency anemia 07/22/2017   Pilar cyst 07/22/2017   Morbid obesity due to excess calories ([place]) 03/19/2017   Anemia 03/19/2017   Chronic obstructive pulmonary disease ([place]) 03/18/2017   Degenerative  lumbar spinal stenosis 02/17/2017   S/P right hip revision 09/02/2016   S/P hip replacement 09/02/2016   Instability of prosthetic hip ([place]) 03/01/2016   Dislocation of hip prosthesis ([place]) 02/17/2016   Abnormal nuclear stress test: Intermediate Risk 10/31/2015   Atypical angina ([place]) 10/31/2015   Trigger finger, acquired 08/20/2012   Cigarette smoker 12/12/2008   Hyperlipidemia 11/17/2008   HYPERTENSION, BENIGN 11/16/2008   Primary osteoarthritis involving multiple joints 11/16/2008    Past Surgical History:  Procedure Laterality Date    APPENDECTOMY     1985   BIOPSY  07/24/2017   Procedure: BIOPSY;  Surgeon: Milus Banister, MD;  Location: [facility];  Service: Endoscopy;;   CARDIAC CATHETERIZATION N/A 10/31/2015   Procedure: Left Heart Cath and Coronary Angiography;  Surgeon: Leonie Man, MD;  Location: [facility];  Service: Cardiovascular;  Laterality: N/A;   CARPAL TUNNEL RELEASE Left    CARPAL TUNNEL RELEASE Right    CHOLECYSTECTOMY  late 1980's   COLONOSCOPY WITH PROPOFOL N/A 07/24/2017   Procedure: COLONOSCOPY WITH PROPOFOL;  Surgeon: Milus Banister, MD;  Location: [facility];  Service: Endoscopy;  Laterality: N/A;   ESOPHAGOGASTRODUODENOSCOPY N/A 07/24/2017   Procedure: ESOPHAGOGASTRODUODENOSCOPY (EGD);  Surgeon: Milus Banister, MD;  Location: [facility];  Service: Endoscopy;  Laterality: N/A;   ESOPHAGOGASTRODUODENOSCOPY (EGD) WITH PROPOFOL N/A 10/19/2020   Procedure: ESOPHAGOGASTRODUODENOSCOPY (EGD) WITH PROPOFOL;  Surgeon: Milus Banister, MD;  Location: [facility];  Service: Endoscopy;  Laterality: N/A;   FINE NEEDLE ASPIRATION N/A 10/19/2020   Procedure: FINE NEEDLE ASPIRATION (FNA) LINEAR;  Surgeon: Milus Banister, MD;  [facility]: [facility];  Service: Endoscopy;  Laterality: N/A;   GALLBLADDER SURGERY  1991   HIP CLOSED REDUCTION Right 01/08/2016   Procedure: CLOSED MANIPULATION HIP;  Surgeon: Susa Day, MD;  [facility]: [facility];  Service: Orthopedics;  Laterality: Right;   HIP CLOSED REDUCTION Right 01/19/2016   Procedure: ATTEMPTED CLOSED REDUCTION RIGHT HIP;  Surgeon: Wylene Simmer, MD;  Location: [facility];  Service: Orthopedics;  Laterality: Right;   HIP CLOSED REDUCTION Right 01/20/2016   Procedure: CLOSED REDUCTION RIGHT TOTAL HIP;  Surgeon: Paralee Cancel, MD;  Location: [facility];  Service: Orthopedics;  Laterality: Right;   HIP CLOSED REDUCTION Right 02/17/2016   Procedure: CLOSED REDUCTION RIGHT TOTAL HIP;  Surgeon: Rod Can, MD;  Location: [facility];  Service: Orthopedics;   Laterality: Right;   HIP CLOSED REDUCTION Right 02/28/2016   Procedure: CLOSED REDUCTION HIP;  Surgeon: Nicholes Stairs, MD;  [facility];  Service: Orthopedics;  Laterality: Right;   IR IMAGING GUIDED PORT INSERTION  11/01/2020   POLYPECTOMY  07/24/2017   Procedure: POLYPECTOMY;  Surgeon: Milus Banister, MD;  Location[facility];  Service: Endoscopy;;   [place], with 1 ovary removed and 2 nd ovary removed 2003   TOTAL HIP ARTHROPLASTY Right    Original surgery 2006 with revision 2010   TOTAL HIP REVISION Right 01/01/2016   Procedure: TOTAL HIP REVISION;  Surgeon: Paralee Cancel, MD;  Location: [facility];  Service: Orthopedics;  Laterality: Right;   TOTAL HIP REVISION Right 03/02/2016   Procedure: TOTAL HIP REVISION;  Surgeon: Paralee Cancel, MD;  Location: [facility];  Service: Orthopedics;  Laterality: Right;   TOTAL HIP REVISION Right 09/02/2016   Procedure: Right hip constrained liner- posterior;  Surgeon: Paralee Cancel, MD;  Location: [facility];  Service: Orthopedics;  Laterality: Right;  ULNAR NERVE TRANSPOSITION Right    UPPER ESOPHAGEAL ENDOSCOPIC ULTRASOUND (EUS) N/A 10/19/2020   Procedure: UPPER ESOPHAGEAL ENDOSCOPIC ULTRASOUND (EUS);  Surgeon: Milus Banister, MD;  Location: [facility];  Service: Endoscopy;  Laterality: N/A;  periduodenal lesion     OB History   No obstetric history on file.     Family History  Problem Relation Age of Onset   COPD Mother    Heart disease Mother    Lung disease Father        Asbestosis   Heart attack Father    Heart disease Father    Cerebral aneurysm Brother    Aneurysm Brother        Brain   Drug abuse Daughter    Epilepsy Son    Alcohol abuse Son    Drug abuse Son    Arthritis Maternal Grandmother    Heart disease Maternal Grandmother    Asthma Maternal Grandfather    Cancer Maternal Grandfather    Arthritis Paternal Grandmother    Heart disease Paternal Grandmother    Stroke  Paternal Grandmother    Early death Paternal Grandfather    Heart disease Paternal Grandfather     Social History   Tobacco Use   Smoking status: Every Day    Packs/day: 2.00    Years: 46.00    Pack years: 92.00    Types: Cigarettes   Smokeless tobacco: Never   Tobacco comments:    Currently smoking 1.5ppd as of 11/10/20  Vaping Use   Vaping Use: Never used  Substance Use Topics   Alcohol use: No   Drug use: No    Home Medications Prior to Admission medications   Medication Sig Start Date End Date Taking? Authorizing Provider  albuterol (PROAIR HFA) 108 (90 Base) MCG/ACT inhaler 2 puffs every 4 hours as needed only  if your can't catch your breath 06/20/20  Yes Margaretha Seeds, MD  ALPRAZolam Duanne Moron) 1 MG tablet Take 1 tablet po TID as needed for anxiety. 11/27/20  Yes Allwardt, Randa Evens, PA-C  aspirin EC 81 MG tablet Take 1 tablet (81 mg total) by mouth daily. Swallow whole. 09/13/20  Yes O'Neal, Cassie Freer, MD  Aspirin-Acetaminophen-Caffeine (GOODY HEADACHE PO) Take 1 packet by mouth 3 (three) times daily as needed (headaches.).   Yes [provider]  atorvastatin (LIPITOR) 20 MG tablet Take 1 tablet (20 mg total) by mouth at bedtime. 08/23/20  Yes Allwardt, Alyssa M, PA-C  cyclobenzaprine (FLEXERIL) 10 MG tablet Take 1 tablet (10 mg total) by mouth 3 (three) times daily as needed for muscle spasms. 05/29/20  Yes Lovorn, Jinny Blossom, MD  DULoxetine (CYMBALTA) 60 MG capsule Take 2 capsules (120 mg total) by mouth daily. Patient taking differently: Take 120 mg by mouth at bedtime. 07/28/20  Yes Lovorn, Jinny Blossom, MD  fluticasone (FLONASE) 50 MCG/ACT nasal spray Place 2 sprays into both nostrils daily. 07/27/20  Yes Allwardt, Randa Evens, PA-C  Fluticasone-Umeclidin-Vilant (TRELEGY ELLIPTA) 200-62.5-25 MCG/INH AEPB Inhale 1 puff into the lungs daily. 11/10/20  Yes Margaretha Seeds, MD  furosemide (LASIX) 20 MG tablet TAKE 1 TABLET BY MOUTH EVERY DAY AS NEEDED 01/25/20  Yes Brunetta Jeans, PA-C  HYDROcodone-acetaminophen (NORCO) 10-325 MG tablet TAKE 1 TABLET BY MOUTH EVERY 6 HOURS AS NEEDED 11/30/20  Yes Lovorn, Jinny Blossom, MD  levothyroxine (SYNTHROID) 150 MCG tablet TAKE 1 TABLET BY MOUTH DAILY BEFORE breakfast 10/03/20  Yes Lovorn, Megan, MD  Lidocaine 4 % PTCH Place 1 patch onto the skin  daily as needed (pain.).   Yes [provider]  loratadine (CLARITIN) 10 MG tablet Take 10 mg by mouth daily.   Yes [provider]  omeprazole (PRILOSEC) 40 MG capsule TAKE ONE CAPSULE BY MOUTH TWICE DAILY 12/04/20  Yes Noralyn Pick, NP  ondansetron (ZOFRAN) 8 MG tablet Take 1 tablet (8 mg total) by mouth every 8 (eight) hours as needed. 10/27/20  Yes Orson Slick, MD  OXYGEN Inhale 3 L into the lungs at bedtime. Uses At Night   Yes [provider]  potassium chloride (KLOR-CON) 10 MEQ tablet Take 1 tablet (10 mEq total) by mouth at bedtime. 08/21/20  Yes Allwardt, Alyssa M, PA-C  prochlorperazine (COMPAZINE) 10 MG tablet Take 1 tablet (10 mg total) by mouth every 6 (six) hours as needed for nausea or vomiting. 10/27/20  Yes Orson Slick, MD  triamterene-hydrochlorothiazide (MAXZIDE-25) 37.5-25 MG tablet Take 1 tablet by mouth daily. 05/24/20  Yes Allwardt, Alyssa M, PA-C  cephALEXin (KEFLEX) 500 MG capsule Take 1 capsule (500 mg total) by mouth 4 (four) times daily. 12/18/20   Sherrell Puller, PA-C  dicyclomine (BENTYL) 10 MG capsule Take 1 capsule (10 mg total) by mouth 2 (two) times daily as needed for spasms. Patient not taking: Reported on 12/19/2020 06/14/20   Noralyn Pick, NP  doxycycline (VIBRAMYCIN) 100 MG capsule Take 1 capsule (100 mg total) by mouth 2 (two) times daily. 12/18/20   Sherrell Puller, PA-C  glucose blood test strip Use as instructed 05/19/20   Inda Coke, PA  lidocaine-prilocaine (EMLA) cream Apply 1 application topically as needed. Patient not taking: Reported on 12/19/2020 10/27/20   Orson Slick, MD     Allergies    Metformin and related, Nsaids, Aleve [naproxen sodium], Codeine, Penicillins, and Sulfonamide derivatives  Review of Systems   Review of Systems  Constitutional:  Positive for chills, fatigue and fever.  HENT:  Negative for congestion.   Respiratory:  Negative for shortness of breath.   Cardiovascular:  Negative for chest pain.  Gastrointestinal:  Negative for abdominal pain, diarrhea, nausea and vomiting.  Genitourinary:  Positive for dysuria, flank pain and frequency. Negative for enuresis and hematuria.  Musculoskeletal:  Negative for back pain.  Skin:  Negative for rash.  Neurological:  Negative for dizziness and headaches.  Hematological:  Does not bruise/bleed easily.   Physical Exam Updated Vital Signs BP (!) 111/52 (BP Location: Left Arm)   Pulse (!) 123   Temp 99.1 F (37.3 C) (Oral)   Resp 18   Ht 5' 2"  (1.575 m)   Wt 90.7 kg   SpO2 91%   BMI 36.58 kg/m   Physical Exam Vitals and nursing note reviewed.  Constitutional:      General: She is not in acute distress.    Appearance: She is not ill-appearing.  HENT:     Head: Normocephalic and atraumatic.     Nose: No congestion.  Eyes:     Conjunctiva/sclera: Conjunctivae normal.  Cardiovascular:     Rate and Rhythm: Regular rhythm. Tachycardia present.     Pulses: Normal pulses.     Heart sounds: No murmur heard.   No friction rub. No gallop.  Pulmonary:     Effort: No respiratory distress.     Breath sounds: No wheezing, rhonchi or rales.  Abdominal:     Palpations: Abdomen is soft.     Tenderness: There is no abdominal tenderness. There is no right CVA tenderness or left  CVA tenderness.  Skin:    General: Skin is warm and dry.     Comments: Patient has a port right upper chest no surrounding erythema or edema.  Patient is slightly pale appearing.  Neurological:     Mental Status: She is alert.  Psychiatric:        Mood and Affect: Mood normal.    ED Results / Procedures / Treatments    Labs (all labs ordered are listed, but only abnormal results are displayed) Labs Reviewed  LACTIC ACID, PLASMA - Abnormal; Notable for the following components:      Result Value   Lactic Acid, Venous 2.2 (*)    All other components within normal limits  COMPREHENSIVE METABOLIC PANEL - Abnormal; Notable for the following components:   Sodium 129 (*)    Potassium 2.9 (*)    Chloride 96 (*)    Glucose, Bld 108 (*)    Creatinine, Ser 1.10 (*)    Calcium 7.5 (*)    Total Protein 6.0 (*)    Albumin 2.7 (*)    Alkaline Phosphatase 160 (*)    GFR, Estimated 56 (*)    All other components within normal limits  CBC WITH DIFFERENTIAL/PLATELET - Abnormal; Notable for the following components:   WBC 19.3 (*)    Hemoglobin 11.2 (*)    HCT 33.9 (*)    RDW 19.5 (*)    Platelets 21 (*)    Neutro Abs 16.2 (*)    nRBC 1 (*)    All other components within normal limits  D-DIMER, QUANTITATIVE - Abnormal; Notable for the following components:   D-Dimer, Quant 2.85 (*)    All other components within normal limits  MAGNESIUM - Abnormal; Notable for the following components:   Magnesium 1.2 (*)    All other components within normal limits  RESP PANEL BY RT-PCR (FLU A&B, COVID) ARPGX2  CULTURE, BLOOD (ROUTINE X 2)  CULTURE, BLOOD (ROUTINE X 2)  URINE CULTURE  LACTIC ACID, PLASMA  PROTIME-INR  APTT  URINALYSIS, ROUTINE W REFLEX MICROSCOPIC  HIV ANTIBODY (ROUTINE TESTING W REFLEX)  PROTIME-INR  CORTISOL-AM, BLOOD  PROCALCITONIN  BASIC METABOLIC PANEL  CBC  TYPE AND SCREEN    EKG None  Radiology DG Chest 2 View  Result Date: 12/18/2020 CLINICAL DATA:  Shortness of breath with weakness.  Fever. EXAM: CHEST - 2 VIEW COMPARISON:  03/05/2019 FINDINGS: Right-sided chest port in place with distal tip terminating at the level of the distal SVC. The heart size and mediastinal contours are within normal limits. Atherosclerotic calcification of the aortic knob. No focal airspace consolidation,  pleural effusion, or pneumothorax. The visualized skeletal structures are unremarkable. IMPRESSION: No active cardiopulmonary disease. Electronically Signed   By: Davina Poke D.O.   On: 12/18/2020 15:33   CT Angio Chest PE W and/or Wo Contrast  Result Date: 12/19/2020 CLINICAL DATA:  Current chemotherapy for lung cancer. Shortness of breath began 3 days ago. EXAM: CT ANGIOGRAPHY CHEST WITH CONTRAST TECHNIQUE: Multidetector CT imaging of the chest was performed using the standard protocol during bolus administration of intravenous contrast. Multiplanar CT image reconstructions and MIPs were obtained to evaluate the vascular anatomy. CONTRAST:  49m OMNIPAQUE IOHEXOL 350 MG/ML SOLN COMPARISON:  PET-CT 10/13/2020 and CTA chest from 07/26/2020 FINDINGS: Cardiovascular: Delayed but adequate bolus for pulmonary embolus assessment. No filling defect is identified in the pulmonary arterial tree to suggest pulmonary embolus. Coronary, aortic arch, and branch vessel atherosclerotic vascular disease. Borderline cardiomegaly. Mediastinum/Nodes: No pathologic adenopathy  identified. Lungs/Pleura: Emphysema noted. There is some new ground-glass opacities in the upper lobes, 1 of the most conspicuous a 2.3 by 1.8 cm ground-glass density in the left upper lobe on image 54 series 6. These are nonspecific but may reflect alveolitis or atypical pneumonia. Substantially reduced right lower lobe peribronchovascular nodularity, the area of nodularity currently measures about 1.2 by 0.7 cm on image 83 series 6, previously 2.4 by 1.5 cm on 07/26/2020. Upper Abdomen: We do not include the level of the prior pancreaticoduodenal groove lesion. Musculoskeletal: Mild lower thoracic spondylosis. Review of the MIP images confirms the above findings. IMPRESSION: 1. No filling defect is identified in the pulmonary arterial tree to suggest pulmonary embolus. 2. There is some patchy ground-glass opacities in the upper lobes suggesting  alveolitis or atypical pneumonia. COVID pneumonia can sometimes have a similar appearance, and testing may be appropriate. 3. Aortic Atherosclerosis (ICD10-I70.0) and Emphysema (ICD10-J43.9). Coronary atherosclerosis. Borderline cardiomegaly. 4. The right lower lobe nodule is substantially reduced in size compared to 10/13/2020 and 07/26/2020, indicating response to therapy. Electronically Signed   By: Van Clines M.D.   On: 12/19/2020 17:03   DG Chest Port 1 View  Result Date: 12/19/2020 CLINICAL DATA:  Sepsis. EXAM: PORTABLE CHEST 1 VIEW COMPARISON:  12/18/2020 FINDINGS: The cardiac silhouette, mediastinal and hilar contours are normal. The lungs are clear. No pleural effusions. No pulmonary lesions. The right IJ power port is stable. The bony structures are intact. IMPRESSION: No acute cardiopulmonary findings. Electronically Signed   By: Marijo Sanes M.D.   On: 12/19/2020 15:45    Procedures .Critical Care Performed by: Marcello Fennel, PA-C Authorized by: Marcello Fennel, PA-C   Critical care provider statement:    Critical care time (minutes):  45   Critical care time was exclusive of:  Separately billable procedures and treating other patients   Critical care was necessary to treat or prevent imminent or life-threatening deterioration of the following conditions:  Sepsis   Critical care was time spent personally by me on the following activities:  Ordering and performing treatments and interventions, ordering and review of laboratory studies, ordering and review of radiographic studies, pulse oximetry, re-evaluation of patient's condition, review of old charts, examination of patient and evaluation of patient's response to treatment   I assumed direction of critical care for this patient from another provider in my specialty: no     Care discussed with: admitting provider     Medications Ordered in ED Medications  heparin injection 5,000 Units (has no administration in  time range)  sodium chloride flush (NS) 0.9 % injection 3 mL (has no administration in time range)  lactated ringers infusion (has no administration in time range)  cefTRIAXone (ROCEPHIN) 2 g in sodium chloride 0.9 % 100 mL IVPB (has no administration in time range)  azithromycin (ZITHROMAX) 500 mg in sodium chloride 0.9 % 250 mL IVPB (has no administration in time range)  acetaminophen (TYLENOL) tablet 650 mg (has no administration in time range)    Or  acetaminophen (TYLENOL) suppository 650 mg (has no administration in time range)  docusate sodium (COLACE) capsule 100 mg (has no administration in time range)  ondansetron (ZOFRAN) tablet 4 mg (has no administration in time range)    Or  ondansetron (ZOFRAN) injection 4 mg (has no administration in time range)  sodium chloride 0.9 % bolus 1,000 mL (0 mLs Intravenous Stopped 12/19/20 1835)    And  sodium chloride 0.9 % bolus 1,000 mL (0 mLs  Intravenous Stopped 12/19/20 1835)    And  sodium chloride 0.9 % bolus 1,000 mL (0 mLs Intravenous Stopped 12/19/20 1850)  cefTRIAXone (ROCEPHIN) 2 g in sodium chloride 0.9 % 100 mL IVPB (0 g Intravenous Stopped 12/19/20 1713)  potassium chloride 10 mEq in 100 mL IVPB (0 mEq Intravenous Stopped 12/19/20 1850)  magnesium sulfate IVPB 1 g 100 mL (0 g Intravenous Stopped 12/19/20 1850)  oxyCODONE-acetaminophen (PERCOCET/ROXICET) 5-325 MG per tablet 2 tablet (2 tablets Oral Given 12/19/20 1708)  iohexol (OMNIPAQUE) 350 MG/ML injection 100 mL (75 mLs Intravenous Contrast Given 12/19/20 1652)  acetaminophen (TYLENOL) tablet 650 mg (650 mg Oral Given 12/19/20 1810)    ED Course  I have reviewed the triage vital signs and the nursing notes.  Pertinent labs & imaging results that were available during my care of the patient were reviewed by me and considered in my medical decision making (see chart for details).    MDM Rules/Calculators/A&P                          Initial impression-presents with sepsis.   She is alert, does not appear acute distress, vital signs noted for tachycardia slight tachypnea.  She has a positive blood cultures we will activate code sepsis and reassess.  We will add D-dimer she endorses dyspnea on exertion shortness of breath.  Rule out PE.  Work-up-CBC shows leukocytosis of 19.3, normocytic anemia hemoglobin 11.2, platelets 21, CMP reveals sodium of 129, potassium 2.9, chloride 96, glucose 108, creatinine 1.10, calcium at 7.5, GFR 56, prothrombin time INR unremarkable, APTT unremarkable, D-dimer elevated 2.85, magnesium 1.2, respiratory panel unremarkable.  Chest x-ray negative for acute findings.  CT of chest reveals no acute PE, patchy ground opacities in the left upper lobe suggesting atypical pneumonia, right lower lobe nodules reduced in size.  Reassessment-noted that patient had elevated D-dimer will obtain CTA of chest to rule out PE she has increased risk factors tachycardia slight tachypnea currently currently being treated for metastases.  Potassium is noted to be low at 2.9, will add on magnesium for further evaluation will supplement both if needed.  UA yesterday was positive for nitrates and she is endorsing UTI-like symptoms dysuria, urgency, foul smelling urine likely the cause of her bacteremia, will start her on Rocephin.  Calcium is decreased but there is an adverse reaction when given ceftriaxone and calcium, will defer calcium supplementation at this time.   Will recommend admission for bacteremia likely secondary due to UTI patient agreed in this plan will consult with hospitalist for admission.  Consult-spoke with Dr. Evangeline Gula who will admit the patient.  Rule out-I have low suspicion for ACS patient denies chest pain, EKG without signs of ischemia, presentation atypical etiology.  low suspicion for PE as CTA of chest is negative for acute findings.  I have low suspicion for liver or gallbladder abnormality as she has no right upper quadrant tenderness,  no elevation in her liver enzymes or T bili.  She is slight elevation of alk phos but this is nonspecific and i very low suspicion for gallbladder normality's.  I have low suspicion for kidney stone as she has no CVA tenderness, pain is bilaterally in the lower back does not radiate, presentation more typical with [place]/UTI.  Will defer imaging at this time as abdomen is soft nontender to palpation, no nausea or vomiting present.  It is noted the patient has a platelet of 21, this is likely secondary due to  chemotherapy, no active bleeding at this time will defer on platelet transfusion.  Plan-admission for urosepsis as well as electrolyte derailments. Final Clinical Impression(s) / ED Diagnoses Final diagnoses:  Sepsis, due to unspecified organism, unspecified whether acute organ dysfunction present ([place])  Hypokalemia  Hypomagnesemia    Rx / DC Orders ED Discharge Orders     None        Aron Baba 12/19/20 1933    Wyvonnia Dusky, MD 12/20/20 [phone]

## 2020-12-19 NOTE — Telephone Encounter (Signed)
AMA-/Sepsis Yesterday pt left AMA from [facility] ED. She said she received  IV  antibiotic  and  " I left because I did not want to go into the hospital."  She was contacted  today By ED and told that she has sepsis and needs to come back to ED immediately for admission.   Pt called me with this message , I reinforced going to ED now .  She said 'I want to wash up first".  I told her sepsis can be fatal very quickly and she does not need to wait and  to go to ED now. She said her husband will take her.

## 2020-12-19 NOTE — ED Triage Notes (Signed)
Pt instructed to come here due to sepsis and bacteria in her blood. Denies any fever or N/V. Currently on chemo last was 2 weeks ago.

## 2020-12-19 NOTE — ED Provider Notes (Addendum)
12/19/2020 05:32 AM  Called by the microbiology lab that the patient had multiple critical findings on her blood cultures from her emergency department visit yesterday.  In chart review she was febrile and the ED provider had concern for sepsis.  This was communicated, but the patient chose to leave [place].  The patient has 1 culture growing gram-negative species and second growing anaerobic gram-negative species.  No additional information is available at this time.  I called the patient's home phone number listed in the chart multiple times and left a HIPAA compliant message to return my call.  I attempted calling the patient's cell phone number which did not connect.  I also called the patient's husband who did not answer his cell phone.    Margette Fast, MD 12/19/20 [phone]    Margette Fast, MD 12/19/20 [phone]

## 2020-12-19 NOTE — H&P (Addendum)
History and Physical    [name]:[id number] DOB: [birth year] DOA: 12/19/2020  PCP: Fredirick Lathe, PA-C  Patient coming from: Home  I have personally briefly reviewed patient's old medical records in [place]  Chief Complaint: Presented at request of her physician due to sepsis and E. coli bacteremia  HPI: [name] is a 65 y.o. female with medical history significant of chronic back pain, COPD, non-small cell lung cancer status postsecond dose of chemo last week, home oxygen use, visual reflux disease, hypertension, and diabetes type 2 (resolved since 2021) who presented to the emergency department yesterday feeling poorly week with a cough she was tachycardic and had an elevated white blood cell count.  At that point admission for sepsis was recommended.  The patient received IV antibiotics cefepime/Vanco/Flagyl and declined admission and went home.  Today E. coli was found in her blood cultures and she was called back for admission to the hospital.  In the emergency department she received 2 g of ceftriaxone for sepsis and a urinary tract infection.  Chest CTA was negative except for perhaps a finding of atypical pneumonia.  Respiratory viral panel was negative.  On presentation she is not hypoxic and not tachypneic.  Remains tachycardic.  He complains about back pain bilaterally.  Urinalysis was obtained yesterday and was consistent with a urinary tract infection. Blood culture panel came back positive for Enterobacterales and E. coli.  Enterobacterales repeat represents a large order of gram-negative bacteria and not a single organism; further identification is pending. Patient is being admitted to the hospital for treatment of sepsis due to E. coli likely from a urinary tract infection.  ED Course: 2 g of ceftriaxone were given and IV fluids were given as well.  Review of Systems: As per HPI otherwise all other systems reviewed and  negative.   Past Medical  History:  Diagnosis Date   Allergic rhinitis    Anemia    Anxiety    Chicken pox    Chronic back pain    COPD (chronic obstructive pulmonary disease) (HCC)    Depression    DM (diabetes mellitus) ([place])    Essential hypertension    GERD (gastroesophageal reflux disease)    Headache    migraines   History of gastritis    EGD 2015   History of home oxygen therapy    2 liters at hs last 6 months   Hyperlipidemia    Hypothyroidism    Migraines    Osteoarthritis    oa   Scoliosis     Past Surgical History:  Procedure Laterality Date   APPENDECTOMY     1985   BIOPSY  07/24/2017   Procedure: BIOPSY;  Surgeon: Milus Banister, MD;  Location: [facility];  Service: Endoscopy;;   CARDIAC CATHETERIZATION N/A 10/31/2015   Procedure: Left Heart Cath and Coronary Angiography;  Surgeon: Leonie Man, MD;  Location: [facility];  Service: Cardiovascular;  Laterality: N/A;   CARPAL TUNNEL RELEASE Left    CARPAL TUNNEL RELEASE Right    CHOLECYSTECTOMY  late 1980's   COLONOSCOPY WITH PROPOFOL N/A 07/24/2017   Procedure: COLONOSCOPY WITH PROPOFOL;  Surgeon: Milus Banister, MD;  [facility];  Service: Endoscopy;  Laterality: N/A;   ESOPHAGOGASTRODUODENOSCOPY N/A 07/24/2017   Procedure: ESOPHAGOGASTRODUODENOSCOPY (EGD);  Surgeon: Milus Banister, MD;  Location: [facility];  Service: Endoscopy;  Laterality: N/A;   ESOPHAGOGASTRODUODENOSCOPY (EGD) WITH PROPOFOL N/A 10/19/2020   Procedure: ESOPHAGOGASTRODUODENOSCOPY (EGD)  WITH PROPOFOL;  Surgeon: Milus Banister, MD;  Location: [facility];  Service: Endoscopy;  Laterality: N/A;   FINE NEEDLE ASPIRATION N/A 10/19/2020   Procedure: FINE NEEDLE ASPIRATION (FNA) LINEAR;  Surgeon: Milus Banister, MD;  [facility];  Service: Endoscopy;  Laterality: N/A;   GALLBLADDER SURGERY  1991   HIP CLOSED REDUCTION Right 01/08/2016   Procedure: CLOSED MANIPULATION HIP;  Surgeon: Susa Day, MD;  [facility];  Service:  Orthopedics;  Laterality: Right;   HIP CLOSED REDUCTION Right 01/19/2016   Procedure: ATTEMPTED CLOSED REDUCTION RIGHT HIP;  Surgeon: Wylene Simmer, MD;  [facility]: [facility];  Service: Orthopedics;  Laterality: Right;   HIP CLOSED REDUCTION Right 01/20/2016   Procedure: CLOSED REDUCTION RIGHT TOTAL HIP;  Surgeon: Paralee Cancel, MD;  [facility];  Service: Orthopedics;  Laterality: Right;   HIP CLOSED REDUCTION Right 02/17/2016   Procedure: CLOSED REDUCTION RIGHT TOTAL HIP;  Surgeon: Rod Can, MD;  Location: [facility];  Service: Orthopedics;  Laterality: Right;   HIP CLOSED REDUCTION Right 02/28/2016   Procedure: CLOSED REDUCTION HIP;  Surgeon: Nicholes Stairs, MD;  [facility];  Service: Orthopedics;  Laterality: Right;   IR IMAGING GUIDED PORT INSERTION  11/01/2020   POLYPECTOMY  07/24/2017   Procedure: POLYPECTOMY;  Surgeon: Milus Banister, MD;  [facility];  Service: Endoscopy;;   [place], with 1 ovary removed and 2 nd ovary removed 2003   TOTAL HIP ARTHROPLASTY Right    Original surgery 2006 with revision 2010   TOTAL HIP REVISION Right 01/01/2016   Procedure: TOTAL HIP REVISION;  Surgeon: Paralee Cancel, MD;  [facility];  Service: Orthopedics;  Laterality: Right;   TOTAL HIP REVISION Right 03/02/2016   Procedure: TOTAL HIP REVISION;  Surgeon: Paralee Cancel, MD;  [facility];  Service: Orthopedics;  Laterality: Right;   TOTAL HIP REVISION Right 09/02/2016   Procedure: Right hip constrained liner- posterior;  Surgeon: Paralee Cancel, MD;  [facility];  Service: Orthopedics;  Laterality: Right;   ULNAR NERVE TRANSPOSITION Right    UPPER ESOPHAGEAL ENDOSCOPIC ULTRASOUND (EUS) N/A 10/19/2020   Procedure: UPPER ESOPHAGEAL ENDOSCOPIC ULTRASOUND (EUS);  Surgeon: Milus Banister, MD;  Location: [facility];  Service: Endoscopy;  Laterality: N/A;  periduodenal lesion    Social History   Social History Narrative    Right handed    Caffeine~ 2 cups per day    Lives at home with husband (strained relationship)   Primary caretaker for disabled brother who had aneurism   Daughter died Jul 07, 2018      reports that she has been smoking cigarettes. She has a 92.00 pack-year smoking history. She has never used smokeless tobacco. She reports that she does not drink alcohol and does not use drugs.  She continues to enjoy smoking 1-1/2 packs of cigarettes a day as of 11/10/2020.  Allergies  Allergen Reactions   Metformin And Related Diarrhea   Nsaids Diarrhea   Aleve [Naproxen Sodium] Other (See Comments)    Headache    Codeine Nausea Only and Other (See Comments)    GI upset   Penicillins Nausea Only and Other (See Comments)    GI upset    Sulfonamide Derivatives Hives    Family History  Problem Relation Age of Onset   COPD Mother    Heart disease Mother    Lung disease Father  Asbestosis   Heart attack Father    Heart disease Father    Cerebral aneurysm Brother    Aneurysm Brother        Brain   Drug abuse Daughter    Epilepsy Son    Alcohol abuse Son    Drug abuse Son    Arthritis Maternal Grandmother    Heart disease Maternal Grandmother    Asthma Maternal Grandfather    Cancer Maternal Grandfather    Arthritis Paternal Grandmother    Heart disease Paternal Grandmother    Stroke Paternal Grandmother    Early death Paternal Grandfather    Heart disease Paternal Grandfather      Prior to Admission medications   Medication Sig Start Date End Date Taking? Authorizing Provider  albuterol (PROAIR HFA) 108 (90 Base) MCG/ACT inhaler 2 puffs every 4 hours as needed only  if your can't catch your breath 06/20/20  Yes Margaretha Seeds, MD  ALPRAZolam Duanne Moron) 1 MG tablet Take 1 tablet po TID as needed for anxiety. 11/27/20  Yes Allwardt, Randa Evens, PA-C  aspirin EC 81 MG tablet Take 1 tablet (81 mg total) by mouth daily. Swallow whole. 09/13/20  Yes O'Neal, Cassie Freer, MD   Aspirin-Acetaminophen-Caffeine (GOODY HEADACHE PO) Take 1 packet by mouth 3 (three) times daily as needed (headaches.).   Yes [provider]  atorvastatin (LIPITOR) 20 MG tablet Take 1 tablet (20 mg total) by mouth at bedtime. 08/23/20  Yes Allwardt, Alyssa M, PA-C  cyclobenzaprine (FLEXERIL) 10 MG tablet Take 1 tablet (10 mg total) by mouth 3 (three) times daily as needed for muscle spasms. 05/29/20  Yes Lovorn, Jinny Blossom, MD  DULoxetine (CYMBALTA) 60 MG capsule Take 2 capsules (120 mg total) by mouth daily. Patient taking differently: Take 120 mg by mouth at bedtime. 07/28/20  Yes Lovorn, Jinny Blossom, MD  fluticasone (FLONASE) 50 MCG/ACT nasal spray Place 2 sprays into both nostrils daily. 07/27/20  Yes Allwardt, Randa Evens, PA-C  Fluticasone-Umeclidin-Vilant (TRELEGY ELLIPTA) 200-62.5-25 MCG/INH AEPB Inhale 1 puff into the lungs daily. 11/10/20  Yes Margaretha Seeds, MD  furosemide (LASIX) 20 MG tablet TAKE 1 TABLET BY MOUTH EVERY DAY AS NEEDED 01/25/20  Yes Brunetta Jeans, PA-C  HYDROcodone-acetaminophen (NORCO) 10-325 MG tablet TAKE 1 TABLET BY MOUTH EVERY 6 HOURS AS NEEDED 11/30/20  Yes Lovorn, Jinny Blossom, MD  levothyroxine (SYNTHROID) 150 MCG tablet TAKE 1 TABLET BY MOUTH DAILY BEFORE breakfast 10/03/20  Yes Lovorn, Megan, MD  Lidocaine 4 % PTCH Place 1 patch onto the skin daily as needed (pain.).   Yes [provider]  loratadine (CLARITIN) 10 MG tablet Take 10 mg by mouth daily.   Yes [provider]  omeprazole (PRILOSEC) 40 MG capsule TAKE ONE CAPSULE BY MOUTH TWICE DAILY 12/04/20  Yes Noralyn Pick, NP  ondansetron (ZOFRAN) 8 MG tablet Take 1 tablet (8 mg total) by mouth every 8 (eight) hours as needed. 10/27/20  Yes Orson Slick, MD  OXYGEN Inhale 3 L into the lungs at bedtime. Uses At Night   Yes [provider]  potassium chloride (KLOR-CON) 10 MEQ tablet Take 1 tablet (10 mEq total) by mouth at bedtime. 08/21/20  Yes Allwardt, Alyssa M, PA-C   prochlorperazine (COMPAZINE) 10 MG tablet Take 1 tablet (10 mg total) by mouth every 6 (six) hours as needed for nausea or vomiting. 10/27/20  Yes Orson Slick, MD  triamterene-hydrochlorothiazide (MAXZIDE-25) 37.5-25 MG tablet Take 1 tablet by mouth daily. 05/24/20  Yes Allwardt,  Alyssa M, PA-C  cephALEXin (KEFLEX) 500 MG capsule Take 1 capsule (500 mg total) by mouth 4 (four) times daily. 12/18/20   Sherrell Puller, PA-C  dicyclomine (BENTYL) 10 MG capsule Take 1 capsule (10 mg total) by mouth 2 (two) times daily as needed for spasms. Patient not taking: Reported on 12/19/2020 06/14/20   Noralyn Pick, NP  doxycycline (VIBRAMYCIN) 100 MG capsule Take 1 capsule (100 mg total) by mouth 2 (two) times daily. 12/18/20   Sherrell Puller, PA-C  glucose blood test strip Use as instructed 05/19/20   Inda Coke, PA  lidocaine-prilocaine (EMLA) cream Apply 1 application topically as needed. Patient not taking: Reported on 12/19/2020 10/27/20   Orson Slick, MD    Physical Exam:  Constitutional: NAD, anxious, comfortable Vitals:   12/19/20 2000 12/19/20 2030 12/19/20 2100 12/19/20 2200  BP: (!) 106/51 113/70 114/68 116/62  Pulse: (!) 117 (!) 120 (!) 120 (!) 121  Resp: 11 15 13 16   Temp:      TempSrc:      SpO2: 93% 97% 96% 93%  Weight:      Height:       Eyes: PERRL, lids and conjunctivae normal ENMT: Mucous membranes are dry Posterior pharynx clear of any exudate or lesions.Normal dentition.  Neck: normal, supple, no masses, no thyromegaly Respiratory: clear to auscultation bilaterally, no wheezing, no crackles. Normal respiratory effort. No accessory muscle use.  Cardiovascular: Tachy rate and rhythm, no murmurs / rubs / gallops. No extremity edema. 2+ pedal pulses. No carotid bruits.  Abdomen: no tenderness, no masses palpated. No hepatosplenomegaly. Bowel sounds positive.  Musculoskeletal: no clubbing / cyanosis. No joint deformity upper and lower extremities. Good ROM,  no contractures. Normal muscle tone.  Skin: no rashes, lesions, ulcers. No induration Neurologic: CN 2-12 grossly intact. Sensation intact, DTR normal. Strength 5/5 in all 4.  Psychiatric: Normal judgment and insight. Alert and oriented x 3. Normal mood.    Labs on Admission: I have personally reviewed following labs and imaging studies  CBC: Recent Labs  Lab 12/18/20 1420 12/19/20 1455  WBC 19.8* 19.3*  NEUTROABS 17.2* 16.2*  HGB 11.1* 11.2*  HCT 32.6* 33.9*  MCV 81.3 81.7  PLT 18* 21*   Basic Metabolic Panel: Recent Labs  Lab 12/18/20 1443 12/19/20 1455  NA 127* 129*  K 3.2* 2.9*  CL 96* 96*  CO2 21* 23  GLUCOSE 179* 108*  BUN 10 11  CREATININE 1.03* 1.10*  CALCIUM 7.5* 7.5*  MG  --  1.2*   GFR: Estimated Creatinine Clearance: 54.1 mL/min (A) (by C-G formula based on SCr of 1.1 mg/dL (H)). Liver Function Tests: Recent Labs  Lab 12/18/20 1443 12/19/20 1455  AST 21 29  ALT 28 26  ALKPHOS 152* 160*  BILITOT 0.6 1.0  PROT 5.8* 6.0*  ALBUMIN 2.7* 2.7*   Recent Labs  Lab 12/18/20 1443  LIPASE 13   No results for input(s): AMMONIA in the last 168 hours. Coagulation Profile: Recent Labs  Lab 12/18/20 1443 12/19/20 1455  INR 1.1 1.1   Urine analysis:    Component Value Date/Time   COLORURINE YELLOW 12/18/2020 1616   APPEARANCEUR HAZY (A) 12/18/2020 1616   LABSPEC 1.014 12/18/2020 1616   PHURINE 7.0 12/18/2020 1616   GLUCOSEU NEGATIVE 12/18/2020 1616   HGBUR MODERATE (A) 12/18/2020 1616   BILIRUBINUR NEGATIVE 12/18/2020 1616   BILIRUBINUR Negative 07/27/2019 [place] 12/18/2020 1616   PROTEINUR 100 (A) 12/18/2020 1616   UROBILINOGEN  0.2 07/27/2019 1155   UROBILINOGEN 1.0 03/10/2008 0940   NITRITE POSITIVE (A) 12/18/2020 1616   LEUKOCYTESUR NEGATIVE 12/18/2020 1616    Radiological Exams on Admission: DG Chest 2 View  Result Date: 12/18/2020 CLINICAL DATA:  Shortness of breath with weakness.  Fever. EXAM: CHEST - 2 VIEW  COMPARISON:  03/05/2019 FINDINGS: Right-sided chest port in place with distal tip terminating at the level of the distal SVC. The heart size and mediastinal contours are within normal limits. Atherosclerotic calcification of the aortic knob. No focal airspace consolidation, pleural effusion, or pneumothorax. The visualized skeletal structures are unremarkable. IMPRESSION: No active cardiopulmonary disease. Electronically Signed   By: Davina Poke D.O.   On: 12/18/2020 15:33   CT Angio Chest PE W and/or Wo Contrast  Result Date: 12/19/2020 CLINICAL DATA:  Current chemotherapy for lung cancer. Shortness of breath began 3 days ago. EXAM: CT ANGIOGRAPHY CHEST WITH CONTRAST TECHNIQUE: Multidetector CT imaging of the chest was performed using the standard protocol during bolus administration of intravenous contrast. Multiplanar CT image reconstructions and MIPs were obtained to evaluate the vascular anatomy. CONTRAST:  42mL OMNIPAQUE IOHEXOL 350 MG/ML SOLN COMPARISON:  PET-CT 10/13/2020 and CTA chest from 07/26/2020 FINDINGS: Cardiovascular: Delayed but adequate bolus for pulmonary embolus assessment. No filling defect is identified in the pulmonary arterial tree to suggest pulmonary embolus. Coronary, aortic arch, and branch vessel atherosclerotic vascular disease. Borderline cardiomegaly. Mediastinum/Nodes: No pathologic adenopathy identified. Lungs/Pleura: Emphysema noted. There is some new ground-glass opacities in the upper lobes, 1 of the most conspicuous a 2.3 by 1.8 cm ground-glass density in the left upper lobe on image 54 series 6. These are nonspecific but may reflect alveolitis or atypical pneumonia. Substantially reduced right lower lobe peribronchovascular nodularity, the area of nodularity currently measures about 1.2 by 0.7 cm on image 83 series 6, previously 2.4 by 1.5 cm on 07/26/2020. Upper Abdomen: We do not include the level of the prior pancreaticoduodenal groove lesion. Musculoskeletal:  Mild lower thoracic spondylosis. Review of the MIP images confirms the above findings. IMPRESSION: 1. No filling defect is identified in the pulmonary arterial tree to suggest pulmonary embolus. 2. There is some patchy ground-glass opacities in the upper lobes suggesting alveolitis or atypical pneumonia. COVID pneumonia can sometimes have a similar appearance, and testing may be appropriate. 3. Aortic Atherosclerosis (ICD10-I70.0) and Emphysema (ICD10-J43.9). Coronary atherosclerosis. Borderline cardiomegaly. 4. The right lower lobe nodule is substantially reduced in size compared to 10/13/2020 and 07/26/2020, indicating response to therapy. Electronically Signed   By: Van Clines M.D.   On: 12/19/2020 17:03   DG Chest Port 1 View  Result Date: 12/19/2020 CLINICAL DATA:  Sepsis. EXAM: PORTABLE CHEST 1 VIEW COMPARISON:  12/18/2020 FINDINGS: The cardiac silhouette, mediastinal and hilar contours are normal. The lungs are clear. No pleural effusions. No pulmonary lesions. The right IJ power port is stable. The bony structures are intact. IMPRESSION: No acute cardiopulmonary findings. Electronically Signed   By: Marijo Sanes M.D.   On: 12/19/2020 15:45    EKG: Independently reviewed. Sinus tachycardia Low voltage, precordial leads Baseline wander in lead(s) II III aVF V6 No significant change since last tracing  Assessment/Plan Principal Problem:   E. coli sepsis (HCC) Active Problems:   Chronic respiratory failure with hypoxia (HCC)   Small cell lung cancer (HCC)   Hypothyroidism   OSA (obstructive sleep apnea)   Hyperlipidemia   Anxiety state   Cigarette smoker    1.  E. coli sepsis: Continue ceftriaxone 2 g IV  daily.  Continue IV fluids for another 3 L.  Follow culture results for results of other Enterobacter type gram-negative organisms.  2.  Chronic respiratory failure with hypoxia and poss atypical PNA per Ct scan:  Patient uses 3 L of oxygen at nighttime at home.  We will  continue that she may require some daytime oxygen given the sepsis. Azithromycin given as well  3.  Small cell lung cancer: Patient seen by oncology she received her second dose of chemotherapy last week.  We will continue with plan once discharged.  4.  Hypothyroidism: Check TSH continue levothyroxine.  TSH on November 7 was 0.222 which is very low.  We will repeat today.  5.  Obstructive sleep apnea: Patient encouraged to bring in her CPAP from home.  Will order when available.  6.  Hyperlipidemia: Continue home medications  7.  Anxiety state: patient very concerned about making sure she gets her Xanax which has been ordered.  8.  Cigarette smoker: Despite her diagnosis of small cell lung cancer the patient continues to smoke.  Smoking cessation was encouraged.  We will give her a nicotine patch.  DVT prophylaxis: SCDs ordered as platelet counts less than 60,000. Code Status: Full code Family Communication: No family present at the time of admission Disposition Plan: Possibly home.  Although I am concerned about patient's ability to live alone. Consults called: None Admission status: Inpatient   Lady Deutscher MD FACP [facility] Pager [phone]  How to contact the [facility] Attending or Consulting provider [place] or covering provider during after hours [place], for this patient?  Check the care team in [facility] and look for a) attending/consulting [facility] provider listed and b) the [facility] team listed Log into [email or web address] and use [facility]'s universal password to access. If you do not have the password, please contact the hospital operator. Locate the [facility] provider you are looking for under [facility] and page to a number that you can be directly reached. If you still have difficulty reaching the provider, please page the [facility] (Director on Call) for the Hospitalists listed on amion for assistance.  If 7PM-7AM, please contact night-coverage [email or web address] Password  [facility]  12/19/2020, 10:28 PM    Admit - It is my clinical opinion that admission to [facility] is reasonable and necessary because of the expectation that this patient will require hospital care that crosses at least 2 midnights to treat this condition based on the medical complexity of the problems presented.  Given the aforementioned information, the predictability of an adverse outcome is felt to be significant.

## 2020-12-19 NOTE — ED Provider Notes (Signed)
Patient returned the call to [place].  I reviewed the note from Dr. Laverta Baltimore.  I spoke with the patient and instructed that she has bacteria growing in her blood, and that it is best if she gets admitted to the hospital for IV antibiotics.  She has been advised to come to the ER immediately for hospitalization.    Varney Biles, MD 12/19/20 [phone]

## 2020-12-20 ENCOUNTER — Inpatient Hospital Stay (HOSPITAL_COMMUNITY): Payer: Medicare HMO

## 2020-12-20 DIAGNOSIS — J9611 Chronic respiratory failure with hypoxia: Secondary | ICD-10-CM

## 2020-12-20 DIAGNOSIS — C3490 Malignant neoplasm of unspecified part of unspecified bronchus or lung: Secondary | ICD-10-CM

## 2020-12-20 LAB — BASIC METABOLIC PANEL
Anion gap: 6 (ref 5–15)
BUN: 8 mg/dL (ref 8–23)
CO2: 22 mmol/L (ref 22–32)
Calcium: 7.2 mg/dL — ABNORMAL LOW (ref 8.9–10.3)
Chloride: 102 mmol/L (ref 98–111)
Creatinine, Ser: 0.79 mg/dL (ref 0.44–1.00)
GFR, Estimated: >60 mL/min (ref >60)
Glucose, Bld: 113 mg/dL — ABNORMAL HIGH (ref 70–99)
Potassium: 3.3 mmol/L — ABNORMAL LOW (ref 3.5–5.1)
Sodium: 130 mmol/L — ABNORMAL LOW (ref 135–145)

## 2020-12-20 LAB — URINALYSIS, ROUTINE W REFLEX MICROSCOPIC
Bilirubin Urine: NEGATIVE
Glucose, UA: NEGATIVE mg/dL
Ketones, ur: NEGATIVE mg/dL
Leukocytes,Ua: NEGATIVE
Nitrite: NEGATIVE
Protein, ur: NEGATIVE mg/dL
Specific Gravity, Urine: 1.015 (ref 1.005–1.030)
pH: 7.5 (ref 5.0–8.0)

## 2020-12-20 LAB — MRSA NEXT GEN BY PCR, NASAL: MRSA by PCR Next Gen: NOT DETECTED

## 2020-12-20 LAB — PROCALCITONIN: Procalcitonin: 1.44 ng/mL

## 2020-12-20 LAB — CBC
HCT: 27.5 % — ABNORMAL LOW (ref 36.0–46.0)
Hemoglobin: 9.3 g/dL — ABNORMAL LOW (ref 12.0–15.0)
MCH: 27.1 pg (ref 26.0–34.0)
MCHC: 33.8 g/dL (ref 30.0–36.0)
MCV: 80.2 fL (ref 80.0–100.0)
Platelets: 20 10*3/uL — CL (ref 150–400)
RBC: 3.43 MIL/uL — ABNORMAL LOW (ref 3.87–5.11)
RDW: 19.2 % — ABNORMAL HIGH (ref 11.5–15.5)
WBC: 12.8 10*3/uL — ABNORMAL HIGH (ref 4.0–10.5)
nRBC: 0.2 % (ref 0.0–0.2)

## 2020-12-20 LAB — PROTIME-INR
INR: 1.1 (ref 0.8–1.2)
Prothrombin Time: 14.4 seconds (ref 11.4–15.2)

## 2020-12-20 LAB — CORTISOL-AM, BLOOD: Cortisol - AM: 15.9 ug/dL (ref 6.7–22.6)

## 2020-12-20 LAB — URINALYSIS, MICROSCOPIC (REFLEX)

## 2020-12-20 LAB — TSH: TSH: 0.095 u[IU]/mL — ABNORMAL LOW (ref 0.350–4.500)

## 2020-12-20 LAB — HIV ANTIBODY (ROUTINE TESTING W REFLEX): HIV Screen 4th Generation wRfx: NONREACTIVE

## 2020-12-20 MED ORDER — POTASSIUM CHLORIDE CRYS ER 20 MEQ PO TBCR
40.0000 meq | EXTENDED_RELEASE_TABLET | Freq: Two times a day (BID) | ORAL | Status: AC
  Administered 2020-12-20 – 2020-12-21 (×4): 40 meq via ORAL
  Filled 2020-12-20 (×4): qty 2

## 2020-12-20 MED ORDER — MAGNESIUM OXIDE -MG SUPPLEMENT 400 (240 MG) MG PO TABS
800.0000 mg | ORAL_TABLET | Freq: Two times a day (BID) | ORAL | Status: DC
  Administered 2020-12-20 – 2020-12-22 (×5): 800 mg via ORAL
  Filled 2020-12-20 (×5): qty 2

## 2020-12-20 MED ORDER — PHENOL 1.4 % MT LIQD
1.0000 | OROMUCOSAL | Status: DC | PRN
  Filled 2020-12-20: qty 177

## 2020-12-20 MED ORDER — CHLORHEXIDINE GLUCONATE CLOTH 2 % EX PADS
6.0000 | MEDICATED_PAD | Freq: Every day | CUTANEOUS | Status: DC
  Administered 2020-12-20 – 2020-12-22 (×3): 6 via TOPICAL

## 2020-12-20 MED ORDER — LEVOTHYROXINE SODIUM 25 MCG PO TABS
125.0000 ug | ORAL_TABLET | Freq: Every day | ORAL | Status: DC
  Administered 2020-12-21 – 2020-12-22 (×2): 125 ug via ORAL
  Filled 2020-12-20 (×2): qty 1

## 2020-12-20 NOTE — Progress Notes (Signed)
PROGRESS NOTE    [name]  GGY:[id number] DOB: [birth year] DOA: 12/19/2020 PCP: Fredirick Lathe, PA-C    Brief Narrative:  65 year old with history of chronic back pain, COPD, non-small cell lung cancer status post chemotherapy last chemotherapy a week ago, on home oxygen with chronic hypoxemia, hypertension, type 2 diabetes not on treatment presented to the emergency department feeling poorly, weak and dry cough on 11/21.  She was treated as sepsis with IV fluids and broad-spectrum antibiotics and advised admission to the hospital, however patient went home.  Blood cultures came back positive for E. coli and she was asked to come back to the hospital.  Urine was grossly abnormal.   Assessment & Plan:   Principal Problem:   E. coli sepsis ([place]) Active Problems:   Hyperlipidemia   Anxiety state   Cigarette smoker   Hypothyroidism   OSA (obstructive sleep apnea)   Chronic respiratory failure with hypoxia (HCC)   Small cell lung cancer (HCC)  Sepsis present on admission secondary to E. coli bacteremia suspected E. coli UTI: Adequately resuscitated and clinically improving.  Received 30 mill per KG IV fluid bolus and maintenance fluid.  Blood pressures are adequate. Started on Rocephin 2 g daily, continue until final urine cultures and blood cultures. Renal ultrasound today with no evidence of retention, hydronephrosis.  Non-small cell lung cancer on chemotherapy followed by outpatient oncology. Chronic hypoxemic respiratory failure secondary to lung cancer on 2 to 3 L of oxygen at home. Suspected atypical pneumonia, however less likely.  Discontinue azithromycin.  Obstructive sleep apnea: Uses CPAP at night.  Smoker: On nicotine patch.  Counseled to quit.  Hypothyroidism: On levothyroxine.  TSH 0.095.  Excessively suppressed.  Reduce dose of thyroxine to 125 mcg daily from 150 mcg daily.  Hypokalemia: Replace and monitor levels.  Hypomagnesemia: Magnesium 1.2.  Given 1  g of magnesium.  Give further oral magnesium and recheck levels tomorrow morning.  Thrombocytopenia: Likely due to recent chemotherapy.  Platelets are more than 20,000.  Currently no evidence of bleeding.  We will recheck tomorrow morning.  DVT prophylaxis: Place and maintain sequential compression device Start: 12/19/20 2028   Code Status: Full code Family Communication: None at the bedside Disposition Plan: Status is: Inpatient  Remains inpatient appropriate because: Bacteremia needing further investigations and IV antibiotics.         Consultants:  None  Procedures:  None  Antimicrobials:  Rocephin 11/22---   Subjective: Patient was seen and examined.  She has some dry cough but denies any complaints.  Feels weak but denies any nausea or vomiting.  She had difficulty urinating last few days and dysuria but that has improved now with plenty of IV fluids.  Denies any abdomen pain or flank pain.  Objective: Vitals:   12/20/20 1100 12/20/20 1133 12/20/20 1200 12/20/20 1300  BP: 114/69   114/65  Pulse: 99 94 (!) 101 96  Resp: 18 (!) 21 (!) 22 20  Temp:  98.6 F (37 C)    TempSrc:  Oral    SpO2: 96% 94% 94% 94%  Weight:      Height:        Intake/Output Summary (Last 24 hours) at 12/20/2020 1411 Last data filed at 12/20/2020 1012 Gross per 24 hour  Intake 6015.76 ml  Output 3475 ml  Net 2540.76 ml   Filed Weights   12/19/20 1221  Weight: 90.7 kg    Examination:  General exam: Appears calm and comfortable Currently on room air.  Not in any distress. Respiratory system: Clear to auscultation. Respiratory effort normal.  No added sounds. Patient has a port on the right chest wall. Cardiovascular system: S1 & S2 heard, RRR. Marland Kitchen Gastrointestinal system: Abdomen is nondistended, soft and nontender. No organomegaly or masses felt. Normal bowel sounds heard. Central nervous system: Alert and oriented. No focal neurological deficits. Extremities: Symmetric 5 x 5  power. Skin: No rashes, lesions or ulcers Psychiatry: Judgement and insight appear normal. Mood & affect appropriate.     Data Reviewed: I have personally reviewed following labs and imaging studies  CBC: Recent Labs  Lab 12/18/20 1420 12/19/20 1455 12/20/20 0426  WBC 19.8* 19.3* 12.8*  NEUTROABS 17.2* 16.2*  --   HGB 11.1* 11.2* 9.3*  HCT 32.6* 33.9* 27.5*  MCV 81.3 81.7 80.2  PLT 18* 21* 20*   Basic Metabolic Panel: Recent Labs  Lab 12/18/20 1443 12/19/20 1455 12/20/20 0426  NA 127* 129* 130*  K 3.2* 2.9* 3.3*  CL 96* 96* 102  CO2 21* 23 22  GLUCOSE 179* 108* 113*  BUN 10 11 8   CREATININE 1.03* 1.10* 0.79  CALCIUM 7.5* 7.5* 7.2*  MG  --  1.2*  --    GFR: Estimated Creatinine Clearance: 74.4 mL/min (by C-G formula based on SCr of 0.79 mg/dL). Liver Function Tests: Recent Labs  Lab 12/18/20 1443 12/19/20 1455  AST 21 29  ALT 28 26  ALKPHOS 152* 160*  BILITOT 0.6 1.0  PROT 5.8* 6.0*  ALBUMIN 2.7* 2.7*   Recent Labs  Lab 12/18/20 1443  LIPASE 13   No results for input(s): AMMONIA in the last 168 hours. Coagulation Profile: Recent Labs  Lab 12/18/20 1443 12/19/20 1455 12/20/20 0426  INR 1.1 1.1 1.1   Cardiac Enzymes: No results for input(s): CKTOTAL, CKMB, CKMBINDEX, TROPONINI in the last 168 hours. BNP (last 3 results) No results for input(s): PROBNP in the last 8760 hours. HbA1C: No results for input(s): HGBA1C in the last 72 hours. CBG: No results for input(s): GLUCAP in the last 168 hours. Lipid Profile: No results for input(s): CHOL, HDL, LDLCALC, TRIG, CHOLHDL, LDLDIRECT in the last 72 hours. Thyroid Function Tests: Recent Labs    12/19/20 2303  TSH 0.095*   Anemia Panel: No results for input(s): VITAMINB12, FOLATE, FERRITIN, TIBC, IRON, RETICCTPCT in the last 72 hours. Sepsis Labs: Recent Labs  Lab 12/18/20 1445 12/18/20 1628 12/19/20 1455 12/19/20 1658 12/20/20 0426  PROCALCITON  --   --   --   --  1.44  LATICACIDVEN  2.0* 1.5 2.2* 1.8  --     Recent Results (from the past 240 hour(s))  Blood culture (routine x 2)     Status: None (Preliminary result)   Collection Time: 12/18/20  2:20 PM   Specimen: Left Antecubital; Blood  Result Value Ref Range Status   Specimen Description   Final    LEFT ANTECUBITAL Performed at [facility], [address], [place], [place] [address]    Special Requests   Final    BOTTLES DRAWN AEROBIC AND ANAEROBIC Blood Culture results may not be optimal due to an excessive volume of blood received in culture bottles Performed at [facility], [address], [place],  [address]    Culture  Setup Time   Final    IN BOTH AEROBIC AND ANAEROBIC BOTTLES GRAM NEGATIVE RODS Gram Stain Report Called to,Read Back By and Verified With: MHC DILLARD, A @0528  BY MATTHEWS, B 11.22.22 CRITICAL VALUE NOTED.  VALUE IS CONSISTENT  WITH PREVIOUSLY REPORTED AND CALLED VALUE.    Culture   Final    GRAM NEGATIVE RODS IDENTIFICATION TO FOLLOW Performed at [facility], [place] [address], [place], [place] [address]    Report Status PENDING  Incomplete  Resp Panel by RT-PCR (Flu A&B, Covid) Nasopharyngeal Swab     Status: None   Collection Time: 12/18/20  2:43 PM   Specimen: Nasopharyngeal Swab; Nasopharyngeal(NP) swabs in vial transport medium  Result Value Ref Range Status   SARS Coronavirus 2 by RT PCR NEGATIVE NEGATIVE Final    Comment: (NOTE) SARS-CoV-2 target nucleic acids are NOT DETECTED.  The SARS-CoV-2 RNA is generally detectable in upper respiratory specimens during the acute phase of infection. The lowest concentration of SARS-CoV-2 viral copies this assay can detect is 138 copies/mL. A negative result does not preclude SARS-Cov-2 infection and should not be used as the sole basis for treatment or other patient management decisions. A negative result may occur with  improper specimen collection/handling, submission of specimen other than nasopharyngeal swab, presence of  viral mutation(s) within the areas targeted by this assay, and inadequate number of viral copies(<138 copies/mL). A negative result must be combined with clinical observations, patient history, and epidemiological information. The expected result is Negative.  Fact Sheet for Patients:  EntrepreneurPulse.com.au  Fact Sheet for Healthcare Providers:  IncredibleEmployment.be  This test is no t yet approved or cleared by the [place] FDA and  has been authorized for detection and/or diagnosis of SARS-CoV-2 by FDA under an Emergency Use Authorization (EUA). This EUA will remain  in effect (meaning this test can be used) for the duration of the COVID-19 declaration under Section 564(b)(1) of the Act, 21 U.S.C.section 360bbb-3(b)(1), unless the authorization is terminated  or revoked sooner.       Influenza A by PCR NEGATIVE NEGATIVE Final   Influenza B by PCR NEGATIVE NEGATIVE Final    Comment: (NOTE) The Xpert Xpress SARS-CoV-2/FLU/RSV plus assay is intended as an aid in the diagnosis of influenza from Nasopharyngeal swab specimens and should not be used as a sole basis for treatment. Nasal washings and aspirates are unacceptable for Xpert Xpress SARS-CoV-2/FLU/RSV testing.  Fact Sheet for Patients: EntrepreneurPulse.com.au  Fact Sheet for Healthcare Providers: IncredibleEmployment.be  This test is not yet approved or cleared by the [place] FDA and has been authorized for detection and/or diagnosis of SARS-CoV-2 by FDA under an Emergency Use Authorization (EUA). This EUA will remain in effect (meaning this test can be used) for the duration of the COVID-19 declaration under Section 564(b)(1) of the Act, 21 U.S.C. section 360bbb-3(b)(1), unless the authorization is terminated or revoked.  Performed at [facility], [address] [place], [place] [address]   Blood culture (routine x 2)      Status: Abnormal (Preliminary result)   Collection Time: 12/18/20  2:44 PM   Specimen: Right Antecubital; Blood  Result Value Ref Range Status   Specimen Description   Final    RIGHT ANTECUBITAL Performed at [facility], [address], [place], [place] [address]    Special Requests   Final    BOTTLES DRAWN AEROBIC AND ANAEROBIC Blood Culture adequate volume Performed at [facility], [address] [place], [place] [address]    Culture  Setup Time   Final    IN BOTH AEROBIC AND ANAEROBIC BOTTLES GRAM NEGATIVE RODS Gram Stain Report Called to,Read Back By and Verified With: MHP Dillard, A@0527  by Zigmund Daniel, B 11.22.22  Organism ID to follow  CRITICAL RESULT CALLED TO, READ BACK BY AND VERIFIED WITH: Bernita Raisin RN 12/19/20 1018 JDW    Culture (A)  Final    ESCHERICHIA COLI SUSCEPTIBILITIES TO FOLLOW Performed at [facility], [place] [address] [place], [place] [address]    Report Status PENDING  Incomplete  Blood Culture ID Panel (Reflexed)     Status: Abnormal   Collection Time: 12/18/20  2:44 PM  Result Value Ref Range Status   Enterococcus faecalis NOT DETECTED NOT DETECTED Final   Enterococcus Faecium NOT DETECTED NOT DETECTED Final   Listeria monocytogenes NOT DETECTED NOT DETECTED Final   Staphylococcus species NOT DETECTED NOT DETECTED Final   Staphylococcus aureus (BCID) NOT DETECTED NOT DETECTED Final   Staphylococcus epidermidis NOT DETECTED NOT DETECTED Final   Staphylococcus lugdunensis NOT DETECTED NOT DETECTED Final   Streptococcus species NOT DETECTED NOT DETECTED Final   Streptococcus agalactiae NOT DETECTED NOT DETECTED Final   Streptococcus pneumoniae NOT DETECTED NOT DETECTED Final   Streptococcus pyogenes NOT DETECTED NOT DETECTED Final   A.calcoaceticus-baumannii NOT DETECTED NOT DETECTED Final   Bacteroides fragilis NOT DETECTED NOT DETECTED Final   Enterobacterales DETECTED (A) NOT DETECTED Final    Comment: Enterobacterales represent a large order of  gram negative bacteria, not a single organism. CRITICAL RESULT CALLED TO, READ BACK BY AND VERIFIED WITH: Bernita Raisin RN 12/19/20 1018 JDW    Enterobacter cloacae complex NOT DETECTED NOT DETECTED Final   Escherichia coli DETECTED (A) NOT DETECTED Final    Comment: CRITICAL RESULT CALLED TO, READ BACK BY AND VERIFIED WITH: Bernita Raisin RN 12/19/20 1018 JDW    Klebsiella aerogenes NOT DETECTED NOT DETECTED Final   Klebsiella oxytoca NOT DETECTED NOT DETECTED Final   Klebsiella pneumoniae NOT DETECTED NOT DETECTED Final   Proteus species NOT DETECTED NOT DETECTED Final   Salmonella species NOT DETECTED NOT DETECTED Final   Serratia marcescens NOT DETECTED NOT DETECTED Final   Haemophilus influenzae NOT DETECTED NOT DETECTED Final   Neisseria meningitidis NOT DETECTED NOT DETECTED Final   Pseudomonas aeruginosa NOT DETECTED NOT DETECTED Final   Stenotrophomonas maltophilia NOT DETECTED NOT DETECTED Final   Candida albicans NOT DETECTED NOT DETECTED Final   Candida auris NOT DETECTED NOT DETECTED Final   Candida glabrata NOT DETECTED NOT DETECTED Final   Candida krusei NOT DETECTED NOT DETECTED Final   Candida parapsilosis NOT DETECTED NOT DETECTED Final   Candida tropicalis NOT DETECTED NOT DETECTED Final   Cryptococcus neoformans/gattii NOT DETECTED NOT DETECTED Final   CTX-M ESBL NOT DETECTED NOT DETECTED Final   Carbapenem resistance IMP NOT DETECTED NOT DETECTED Final   Carbapenem resistance KPC NOT DETECTED NOT DETECTED Final   Carbapenem resistance NDM NOT DETECTED NOT DETECTED Final   Carbapenem resist OXA 48 LIKE NOT DETECTED NOT DETECTED Final   Carbapenem resistance VIM NOT DETECTED NOT DETECTED Final    Comment: Performed at [facility], [place] [address], [place], [place] [address]  Blood Culture (routine x 2)     Status: None (Preliminary result)   Collection Time: 12/19/20  2:55 PM   Specimen: Left Antecubital; Blood  Result Value Ref Range Status   Specimen Description  LEFT ANTECUBITAL  Final   Special Requests   Final    BOTTLES DRAWN AEROBIC AND ANAEROBIC Blood Culture adequate volume   Culture   Final    NO GROWTH < 24 HOURS Performed at [facility], [address], [place], [place] [address]    Report  Status PENDING  Incomplete  Blood Culture (routine x 2)     Status: None (Preliminary result)   Collection Time: 12/19/20  2:55 PM   Specimen: Right Antecubital; Blood  Result Value Ref Range Status   Specimen Description RIGHT ANTECUBITAL  Final   Special Requests   Final    BOTTLES DRAWN AEROBIC AND ANAEROBIC Blood Culture adequate volume   Culture   Final    NO GROWTH < 24 HOURS Performed at [facility], [address] [place], [place] [address]    Report Status PENDING  Incomplete  Resp Panel by RT-PCR (Flu A&B, Covid) Nasopharyngeal Swab     Status: None   Collection Time: 12/19/20  3:42 PM   Specimen: Nasopharyngeal Swab; Nasopharyngeal(NP) swabs in vial transport medium  Result Value Ref Range Status   SARS Coronavirus 2 by RT PCR NEGATIVE NEGATIVE Final    Comment: (NOTE) SARS-CoV-2 target nucleic acids are NOT DETECTED.  The SARS-CoV-2 RNA is generally detectable in upper respiratory specimens during the acute phase of infection. The lowest concentration of SARS-CoV-2 viral copies this assay can detect is 138 copies/mL. A negative result does not preclude SARS-Cov-2 infection and should not be used as the sole basis for treatment or other patient management decisions. A negative result may occur with  improper specimen collection/handling, submission of specimen other than nasopharyngeal swab, presence of viral mutation(s) within the areas targeted by this assay, and inadequate number of viral copies(<138 copies/mL). A negative result must be combined with clinical observations, patient history, and epidemiological information. The expected result is Negative.  Fact Sheet for Patients:   EntrepreneurPulse.com.au  Fact Sheet for Healthcare Providers:  IncredibleEmployment.be  This test is no t yet approved or cleared by the [place] FDA and  has been authorized for detection and/or diagnosis of SARS-CoV-2 by FDA under an Emergency Use Authorization (EUA). This EUA will remain  in effect (meaning this test can be used) for the duration of the COVID-19 declaration under Section 564(b)(1) of the Act, 21 U.S.C.section 360bbb-3(b)(1), unless the authorization is terminated  or revoked sooner.       Influenza A by PCR NEGATIVE NEGATIVE Final   Influenza B by PCR NEGATIVE NEGATIVE Final    Comment: (NOTE) The Xpert Xpress SARS-CoV-2/FLU/RSV plus assay is intended as an aid in the diagnosis of influenza from Nasopharyngeal swab specimens and should not be used as a sole basis for treatment. Nasal washings and aspirates are unacceptable for Xpert Xpress SARS-CoV-2/FLU/RSV testing.  Fact Sheet for Patients: EntrepreneurPulse.com.au  Fact Sheet for Healthcare Providers: IncredibleEmployment.be  This test is not yet approved or cleared by the [place] FDA and has been authorized for detection and/or diagnosis of SARS-CoV-2 by FDA under an Emergency Use Authorization (EUA). This EUA will remain in effect (meaning this test can be used) for the duration of the COVID-19 declaration under Section 564(b)(1) of the Act, 21 U.S.C. section 360bbb-3(b)(1), unless the authorization is terminated or revoked.  Performed at [facility], [address], [place], [place] [address]   MRSA Next Gen by PCR, Nasal     Status: None   Collection Time: 12/19/20  8:20 PM   Specimen: Nasal Mucosa; Nasal Swab  Result Value Ref Range Status   MRSA by PCR Next Gen NOT DETECTED NOT DETECTED Final    Comment: (NOTE) The GeneXpert MRSA Assay (FDA approved for NASAL specimens only), is one component of a  comprehensive MRSA colonization surveillance program. It is not intended to diagnose  MRSA infection nor to guide or monitor treatment for MRSA infections. Test performance is not FDA approved in patients less than 59 years old. Performed at [facility], [address] [place], [place] [address]          Radiology Studies: DG Chest 2 View  Result Date: 12/18/2020 CLINICAL DATA:  Shortness of breath with weakness.  Fever. EXAM: CHEST - 2 VIEW COMPARISON:  03/05/2019 FINDINGS: Right-sided chest port in place with distal tip terminating at the level of the distal SVC. The heart size and mediastinal contours are within normal limits. Atherosclerotic calcification of the aortic knob. No focal airspace consolidation, pleural effusion, or pneumothorax. The visualized skeletal structures are unremarkable. IMPRESSION: No active cardiopulmonary disease. Electronically Signed   By: Davina Poke D.O.   On: 12/18/2020 15:33   CT Angio Chest PE W and/or Wo Contrast  Result Date: 12/19/2020 CLINICAL DATA:  Current chemotherapy for lung cancer. Shortness of breath began 3 days ago. EXAM: CT ANGIOGRAPHY CHEST WITH CONTRAST TECHNIQUE: Multidetector CT imaging of the chest was performed using the standard protocol during bolus administration of intravenous contrast. Multiplanar CT image reconstructions and MIPs were obtained to evaluate the vascular anatomy. CONTRAST:  34mL OMNIPAQUE IOHEXOL 350 MG/ML SOLN COMPARISON:  PET-CT 10/13/2020 and CTA chest from 07/26/2020 FINDINGS: Cardiovascular: Delayed but adequate bolus for pulmonary embolus assessment. No filling defect is identified in the pulmonary arterial tree to suggest pulmonary embolus. Coronary, aortic arch, and branch vessel atherosclerotic vascular disease. Borderline cardiomegaly. Mediastinum/Nodes: No pathologic adenopathy identified. Lungs/Pleura: Emphysema noted. There is some new ground-glass opacities in the upper lobes, 1 of the most  conspicuous a 2.3 by 1.8 cm ground-glass density in the left upper lobe on image 54 series 6. These are nonspecific but may reflect alveolitis or atypical pneumonia. Substantially reduced right lower lobe peribronchovascular nodularity, the area of nodularity currently measures about 1.2 by 0.7 cm on image 83 series 6, previously 2.4 by 1.5 cm on 07/26/2020. Upper Abdomen: We do not include the level of the prior pancreaticoduodenal groove lesion. Musculoskeletal: Mild lower thoracic spondylosis. Review of the MIP images confirms the above findings. IMPRESSION: 1. No filling defect is identified in the pulmonary arterial tree to suggest pulmonary embolus. 2. There is some patchy ground-glass opacities in the upper lobes suggesting alveolitis or atypical pneumonia. COVID pneumonia can sometimes have a similar appearance, and testing may be appropriate. 3. Aortic Atherosclerosis (ICD10-I70.0) and Emphysema (ICD10-J43.9). Coronary atherosclerosis. Borderline cardiomegaly. 4. The right lower lobe nodule is substantially reduced in size compared to 10/13/2020 and 07/26/2020, indicating response to therapy. Electronically Signed   By: Van Clines M.D.   On: 12/19/2020 17:03   US RENAL  Result Date: 12/20/2020 CLINICAL DATA:  UTI EXAM: RENAL / URINARY TRACT ULTRASOUND COMPLETE COMPARISON:  None. FINDINGS: Right Kidney: Renal measurements: 13.3 x 5.5 x 6.7 cm = volume: 259 mL. Echogenicity within normal limits. No mass or hydronephrosis visualized. Left Kidney: Renal measurements: 12.9 x 6.5 x 8.1 cm = volume: 356 mL. Echogenicity within normal limits. No mass or hydronephrosis visualized. Bladder: Appears normal for degree of bladder distention. Other: None. IMPRESSION: Unremarkable examination. Electronically Signed   By: Ofilia Neas M.D.   On: 12/20/2020 10:31   DG Chest Port 1 View  Result Date: 12/19/2020 CLINICAL DATA:  Sepsis. EXAM: PORTABLE CHEST 1 VIEW COMPARISON:  12/18/2020 FINDINGS: The  cardiac silhouette, mediastinal and hilar contours are normal. The lungs are clear. No pleural effusions. No pulmonary lesions. The right IJ power  port is stable. The bony structures are intact. IMPRESSION: No acute cardiopulmonary findings. Electronically Signed   By: Marijo Sanes M.D.   On: 12/19/2020 15:45        Scheduled Meds:  aspirin EC  81 mg Oral Daily   atorvastatin  20 mg Oral QHS   Chlorhexidine Gluconate Cloth  6 each Topical Daily   docusate sodium  100 mg Oral BID   DULoxetine  120 mg Oral QHS   fluticasone  2 spray Each Nare Daily   fluticasone furoate-vilanterol  1 puff Inhalation Daily   And   umeclidinium bromide  1 puff Inhalation Daily   [START ON 12/21/2020] levothyroxine  125 mcg Oral QAC breakfast   loratadine  10 mg Oral Daily   magnesium oxide  800 mg Oral BID   nicotine  14 mg Transdermal Daily   pantoprazole  40 mg Oral Daily   potassium chloride  40 mEq Oral BID   sodium chloride flush  3 mL Intravenous Q12H   triamterene-hydrochlorothiazide  1 tablet Oral Daily   Continuous Infusions:  azithromycin Stopped (12/19/20 2101)   cefTRIAXone (ROCEPHIN)  IV     lactated ringers 100 mL/hr at 12/20/20 0543     LOS: 1 day    Time spent: 35 minutes    Barb Merino, MD [facility] Pager [phone]

## 2020-12-21 LAB — COMPREHENSIVE METABOLIC PANEL
ALT: 23 U/L (ref 0–44)
AST: 27 U/L (ref 15–41)
Albumin: 2.1 g/dL — ABNORMAL LOW (ref 3.5–5.0)
Alkaline Phosphatase: 116 U/L (ref 38–126)
Anion gap: 5 (ref 5–15)
BUN: 8 mg/dL (ref 8–23)
CO2: 25 mmol/L (ref 22–32)
Calcium: 7.6 mg/dL — ABNORMAL LOW (ref 8.9–10.3)
Chloride: 103 mmol/L (ref 98–111)
Creatinine, Ser: 0.79 mg/dL (ref 0.44–1.00)
GFR, Estimated: >60 mL/min (ref >60)
Glucose, Bld: 94 mg/dL (ref 70–99)
Potassium: 4.4 mmol/L (ref 3.5–5.1)
Sodium: 133 mmol/L — ABNORMAL LOW (ref 135–145)
Total Bilirubin: 0.8 mg/dL (ref 0.3–1.2)
Total Protein: 5 g/dL — ABNORMAL LOW (ref 6.5–8.1)

## 2020-12-21 LAB — CBC WITH DIFFERENTIAL/PLATELET
Band Neutrophils: 4 %
Basophils Absolute: 0 10*3/uL (ref 0.0–0.1)
Basophils Relative: 0 %
Eosinophils Absolute: 0 10*3/uL (ref 0.0–0.5)
Eosinophils Relative: 0 %
HCT: 27.6 % — ABNORMAL LOW (ref 36.0–46.0)
Hemoglobin: 9.1 g/dL — ABNORMAL LOW (ref 12.0–15.0)
Lymphocytes Relative: 17 %
Lymphs Abs: 2.1 10*3/uL (ref 0.7–4.0)
MCH: 27.1 pg (ref 26.0–34.0)
MCHC: 33 g/dL (ref 30.0–36.0)
MCV: 82.1 fL (ref 80.0–100.0)
Metamyelocytes Relative: 2 %
Monocytes Absolute: 1 10*3/uL (ref 0.1–1.0)
Monocytes Relative: 8 %
Neutro Abs: 9.2 10*3/uL — ABNORMAL HIGH (ref 1.7–7.7)
Neutrophils Relative %: 69 %
Platelets: 36 10*3/uL — ABNORMAL LOW (ref 150–400)
RBC: 3.36 MIL/uL — ABNORMAL LOW (ref 3.87–5.11)
RDW: 19.9 % — ABNORMAL HIGH (ref 11.5–15.5)
WBC: 12.6 10*3/uL — ABNORMAL HIGH (ref 4.0–10.5)
nRBC: 0.2 % (ref 0.0–0.2)

## 2020-12-21 LAB — CULTURE, BLOOD (ROUTINE X 2): Special Requests: ADEQUATE

## 2020-12-21 LAB — PHOSPHORUS: Phosphorus: 3 mg/dL (ref 2.5–4.6)

## 2020-12-21 LAB — URINE CULTURE: Culture: NO GROWTH

## 2020-12-21 LAB — MAGNESIUM: Magnesium: 1.5 mg/dL — ABNORMAL LOW (ref 1.7–2.4)

## 2020-12-21 MED ORDER — MAGNESIUM SULFATE 2 GM/50ML IV SOLN
2.0000 g | Freq: Once | INTRAVENOUS | Status: AC
  Administered 2020-12-21: 2 g via INTRAVENOUS
  Filled 2020-12-21: qty 50

## 2020-12-21 MED ORDER — CALCIUM CARBONATE ANTACID 500 MG PO CHEW
1.0000 | CHEWABLE_TABLET | Freq: Three times a day (TID) | ORAL | Status: DC
  Administered 2020-12-21 – 2020-12-22 (×4): 200 mg via ORAL
  Filled 2020-12-21 (×4): qty 1

## 2020-12-21 NOTE — Plan of Care (Signed)

## 2020-12-21 NOTE — Progress Notes (Signed)
PROGRESS NOTE    [name]  NGE:[id number] DOB: 06/17/1955 DOA: 12/19/2020 PCP: Fredirick Lathe, PA-C    Brief Narrative:  65 year old with history of chronic back pain, COPD, non-small cell lung cancer status post chemotherapy last chemotherapy a week ago, on home oxygen with chronic hypoxemia, hypertension, type 2 diabetes not on treatment presented to the emergency department feeling poorly, weak and dry cough on 11/21.  She was treated as sepsis with IV fluids and broad-spectrum antibiotics and advised admission to the hospital, however patient went home.  Blood cultures came back positive for E. coli and she was asked to come back to the hospital.  Urine was grossly abnormal.   Assessment & Plan:   Principal Problem:   E. coli sepsis ([place]) Active Problems:   Hyperlipidemia   Anxiety state   Cigarette smoker   Hypothyroidism   OSA (obstructive sleep apnea)   Chronic respiratory failure with hypoxia (HCC)   Small cell lung cancer (HCC)  Sepsis present on admission secondary to E. coli bacteremia suspected E. coli UTI: Adequately resuscitated and clinically improving.  Blood pressures are adequate. Started on Rocephin 2 g daily, continue today.  Anticipate changing to oral antibiotics by tomorrow. Renal ultrasound with no evidence of retention, hydronephrosis. Unfortunately urine culture was collected after starting antibiotics.  We will treat as urinary source.  Non-small cell lung cancer on chemotherapy followed by outpatient oncology. Chronic hypoxemic respiratory failure secondary to lung cancer on 2 to 3 L of oxygen at home at night. Suspected atypical pneumonia, however less likely.  Discontinue azithromycin.  Obstructive sleep apnea: Uses CPAP at night.  Smoker: On nicotine patch.  Counseled to quit.  Hypothyroidism: On levothyroxine.  TSH 0.095.  Excessively suppressed.  Reduced dose of thyroxine to 125 mcg daily from 150 mcg daily.  We will need recheck in 4  weeks.  Hypokalemia: Replaced and adequate.  Hypomagnesemia: Magnesium 1.5.  Replace further today.   Thrombocytopenia: Stabilizing.  DVT prophylaxis: Place and maintain sequential compression device Start: 12/19/20 2028   Code Status: Full code Family Communication: None at the bedside Disposition Plan: Status is: Inpatient  Remains inpatient appropriate because: Bacteremia needing further investigations and IV antibiotics.         Consultants:  None  Procedures:  None  Antimicrobials:  Rocephin 11/22---   Subjective: Patient seen and examined.  Overnight temperature 100.  Feels weak otherwise no other complaints.  She has not been mobilized yet.   Objective: Vitals:   12/21/20 0900 12/21/20 1000 12/21/20 1100 12/21/20 1148  BP: 134/70 94/71  130/74  Pulse: (!) 104 94  91  Resp: 20 17 19 18   Temp:    98.1 F (36.7 C)  TempSrc:    Oral  SpO2: 90% 92%  94%  Weight:      Height:        Intake/Output Summary (Last 24 hours) at 12/21/2020 1152 Last data filed at 12/21/2020 1103 Gross per 24 hour  Intake 3272.76 ml  Output 5150 ml  Net -1877.24 ml   Filed Weights   12/19/20 1221  Weight: 90.7 kg    Examination:  General exam: Appears calm and comfortable Currently on room air.  Not in any distress. Respiratory system: Clear to auscultation. Respiratory effort normal.  No added sounds. Patient has a port on the right chest wall. Cardiovascular system: S1 & S2 heard, RRR. Marland Kitchen Gastrointestinal system: Abdomen is nondistended, soft and nontender. No organomegaly or masses felt. Normal bowel sounds heard.  Central nervous system: Alert and oriented. No focal neurological deficits. Extremities: Symmetric 5 x 5 power. Skin: No rashes, lesions or ulcers Psychiatry: Judgement and insight appear normal. Mood & affect appropriate.     Data Reviewed: I have personally reviewed following labs and imaging studies  CBC: Recent Labs  Lab 12/18/20 1420  12/19/20 1455 12/20/20 0426 12/21/20 0456  WBC 19.8* 19.3* 12.8* 12.6*  NEUTROABS 17.2* 16.2*  --  9.2*  HGB 11.1* 11.2* 9.3* 9.1*  HCT 32.6* 33.9* 27.5* 27.6*  MCV 81.3 81.7 80.2 82.1  PLT 18* 21* 20* 36*   Basic Metabolic Panel: Recent Labs  Lab 12/18/20 1443 12/19/20 1455 12/20/20 0426 12/21/20 0456  NA 127* 129* 130* 133*  K 3.2* 2.9* 3.3* 4.4  CL 96* 96* 102 103  CO2 21* 23 22 25   GLUCOSE 179* 108* 113* 94  BUN 10 11 8 8   CREATININE 1.03* 1.10* 0.79 0.79  CALCIUM 7.5* 7.5* 7.2* 7.6*  MG  --  1.2*  --  1.5*  PHOS  --   --   --  3.0   GFR: Estimated Creatinine Clearance: 74.4 mL/min (by C-G formula based on SCr of 0.79 mg/dL). Liver Function Tests: Recent Labs  Lab 12/18/20 1443 12/19/20 1455 12/21/20 0456  AST 21 29 27   ALT 28 26 23   ALKPHOS 152* 160* 116  BILITOT 0.6 1.0 0.8  PROT 5.8* 6.0* 5.0*  ALBUMIN 2.7* 2.7* 2.1*   Recent Labs  Lab 12/18/20 1443  LIPASE 13   No results for input(s): AMMONIA in the last 168 hours. Coagulation Profile: Recent Labs  Lab 12/18/20 1443 12/19/20 1455 12/20/20 0426  INR 1.1 1.1 1.1   Cardiac Enzymes: No results for input(s): CKTOTAL, CKMB, CKMBINDEX, TROPONINI in the last 168 hours. BNP (last 3 results) No results for input(s): PROBNP in the last 8760 hours. HbA1C: No results for input(s): HGBA1C in the last 72 hours. CBG: No results for input(s): GLUCAP in the last 168 hours. Lipid Profile: No results for input(s): CHOL, HDL, LDLCALC, TRIG, CHOLHDL, LDLDIRECT in the last 72 hours. Thyroid Function Tests: Recent Labs    12/19/20 2303  TSH 0.095*   Anemia Panel: No results for input(s): VITAMINB12, FOLATE, FERRITIN, TIBC, IRON, RETICCTPCT in the last 72 hours. Sepsis Labs: Recent Labs  Lab 12/18/20 1445 12/18/20 1628 12/19/20 1455 12/19/20 1658 12/20/20 0426  PROCALCITON  --   --   --   --  1.44  LATICACIDVEN 2.0* 1.5 2.2* 1.8  --     Recent Results (from the past 240 hour(s))  Blood culture  (routine x 2)     Status: Abnormal   Collection Time: 12/18/20  2:20 PM   Specimen: Left Antecubital; Blood  Result Value Ref Range Status   Specimen Description   Final    LEFT ANTECUBITAL Performed at [facility], [address] [place], [place] [address]    Special Requests   Final    BOTTLES DRAWN AEROBIC AND ANAEROBIC Blood Culture results may not be optimal due to an excessive volume of blood received in culture bottles Performed at [facility], [address], [place], [place] [address]    Culture  Setup Time   Final    IN BOTH AEROBIC AND ANAEROBIC BOTTLES GRAM NEGATIVE RODS Gram Stain Report Called to,Read Back By and Verified With: MHC DILLARD, A @0528  BY MATTHEWS, B 11.22.22 CRITICAL VALUE NOTED.  VALUE IS CONSISTENT WITH PREVIOUSLY REPORTED AND CALLED VALUE.    Culture (A)  Final  ESCHERICHIA COLI SUSCEPTIBILITIES PERFORMED ON PREVIOUS CULTURE WITHIN THE LAST 5 DAYS. Performed at [facility], [place] [address], [place], [place] [address]    Report Status 12/21/2020 FINAL  Final  Resp Panel by RT-PCR (Flu A&B, Covid) Nasopharyngeal Swab     Status: None   Collection Time: 12/18/20  2:43 PM   Specimen: Nasopharyngeal Swab; Nasopharyngeal(NP) swabs in vial transport medium  Result Value Ref Range Status   SARS Coronavirus 2 by RT PCR NEGATIVE NEGATIVE Final    Comment: (NOTE) SARS-CoV-2 target nucleic acids are NOT DETECTED.  The SARS-CoV-2 RNA is generally detectable in upper respiratory specimens during the acute phase of infection. The lowest concentration of SARS-CoV-2 viral copies this assay can detect is 138 copies/mL. A negative result does not preclude SARS-Cov-2 infection and should not be used as the sole basis for treatment or other patient management decisions. A negative result may occur with  improper specimen collection/handling, submission of specimen other than nasopharyngeal swab, presence of viral mutation(s) within the areas targeted by this  assay, and inadequate number of viral copies(<138 copies/mL). A negative result must be combined with clinical observations, patient history, and epidemiological information. The expected result is Negative.  Fact Sheet for Patients:  EntrepreneurPulse.com.au  Fact Sheet for Healthcare Providers:  IncredibleEmployment.be  This test is no t yet approved or cleared by the [place] FDA and  has been authorized for detection and/or diagnosis of SARS-CoV-2 by FDA under an Emergency Use Authorization (EUA). This EUA will remain  in effect (meaning this test can be used) for the duration of the COVID-19 declaration under Section 564(b)(1) of the Act, 21 U.S.C.section 360bbb-3(b)(1), unless the authorization is terminated  or revoked sooner.       Influenza A by PCR NEGATIVE NEGATIVE Final   Influenza B by PCR NEGATIVE NEGATIVE Final    Comment: (NOTE) The Xpert Xpress SARS-CoV-2/FLU/RSV plus assay is intended as an aid in the diagnosis of influenza from Nasopharyngeal swab specimens and should not be used as a sole basis for treatment. Nasal washings and aspirates are unacceptable for Xpert Xpress SARS-CoV-2/FLU/RSV testing.  Fact Sheet for Patients: EntrepreneurPulse.com.au  Fact Sheet for Healthcare Providers: IncredibleEmployment.be  This test is not yet approved or cleared by the [place] FDA and has been authorized for detection and/or diagnosis of SARS-CoV-2 by FDA under an Emergency Use Authorization (EUA). This EUA will remain in effect (meaning this test can be used) for the duration of the COVID-19 declaration under Section 564(b)(1) of the Act, 21 U.S.C. section 360bbb-3(b)(1), unless the authorization is terminated or revoked.  Performed at [facility], [address], [place], [place] [address]   Blood culture (routine x 2)     Status: Abnormal   Collection Time: 12/18/20  2:44 PM    Specimen: Right Antecubital; Blood  Result Value Ref Range Status   Specimen Description   Final    RIGHT ANTECUBITAL Performed at [facility], [address] [place], [place] [address]    Special Requests   Final    BOTTLES DRAWN AEROBIC AND ANAEROBIC Blood Culture adequate volume Performed at [facility], [address], [place], [place] [address]    Culture  Setup Time   Final    IN BOTH AEROBIC AND ANAEROBIC BOTTLES GRAM NEGATIVE RODS Gram Stain Report Called to,Read Back By and Verified With: MHP Dillard, A@0527  by Zigmund Daniel, B 11.22.22  Organism ID to follow CRITICAL RESULT CALLED TO, READ BACK BY AND VERIFIED WITH: J  BAILEY RN 12/19/20 1018 JDW Performed at [facility] [address] [place], [place] [address]    Culture ESCHERICHIA COLI (A)  Final   Report Status 12/21/2020 FINAL  Final   Organism ID, Bacteria ESCHERICHIA COLI  Final      Susceptibility   Escherichia coli - MIC*    AMPICILLIN 4 SENSITIVE Sensitive     CEFAZOLIN <=4 SENSITIVE Sensitive     CEFEPIME <=0.12 SENSITIVE Sensitive     CEFTAZIDIME <=1 SENSITIVE Sensitive     CEFTRIAXONE <=0.25 SENSITIVE Sensitive     CIPROFLOXACIN <=0.25 SENSITIVE Sensitive     GENTAMICIN <=1 SENSITIVE Sensitive     IMIPENEM <=0.25 SENSITIVE Sensitive     TRIMETH/SULFA <=20 SENSITIVE Sensitive     AMPICILLIN/SULBACTAM <=2 SENSITIVE Sensitive     PIP/TAZO <=4 SENSITIVE Sensitive     * ESCHERICHIA COLI  Blood Culture ID Panel (Reflexed)     Status: Abnormal   Collection Time: 12/18/20  2:44 PM  Result Value Ref Range Status   Enterococcus faecalis NOT DETECTED NOT DETECTED Final   Enterococcus Faecium NOT DETECTED NOT DETECTED Final   Listeria monocytogenes NOT DETECTED NOT DETECTED Final   Staphylococcus species NOT DETECTED NOT DETECTED Final   Staphylococcus aureus (BCID) NOT DETECTED NOT DETECTED Final   Staphylococcus epidermidis NOT DETECTED NOT DETECTED Final   Staphylococcus lugdunensis NOT DETECTED NOT  DETECTED Final   Streptococcus species NOT DETECTED NOT DETECTED Final   Streptococcus agalactiae NOT DETECTED NOT DETECTED Final   Streptococcus pneumoniae NOT DETECTED NOT DETECTED Final   Streptococcus pyogenes NOT DETECTED NOT DETECTED Final   A.calcoaceticus-baumannii NOT DETECTED NOT DETECTED Final   Bacteroides fragilis NOT DETECTED NOT DETECTED Final   Enterobacterales DETECTED (A) NOT DETECTED Final    Comment: Enterobacterales represent a large order of gram negative bacteria, not a single organism. CRITICAL RESULT CALLED TO, READ BACK BY AND VERIFIED WITH: Bernita Raisin RN 12/19/20 1018 JDW    Enterobacter cloacae complex NOT DETECTED NOT DETECTED Final   Escherichia coli DETECTED (A) NOT DETECTED Final    Comment: CRITICAL RESULT CALLED TO, READ BACK BY AND VERIFIED WITH: Bernita Raisin RN 12/19/20 1018 JDW    Klebsiella aerogenes NOT DETECTED NOT DETECTED Final   Klebsiella oxytoca NOT DETECTED NOT DETECTED Final   Klebsiella pneumoniae NOT DETECTED NOT DETECTED Final   Proteus species NOT DETECTED NOT DETECTED Final   Salmonella species NOT DETECTED NOT DETECTED Final   Serratia marcescens NOT DETECTED NOT DETECTED Final   Haemophilus influenzae NOT DETECTED NOT DETECTED Final   Neisseria meningitidis NOT DETECTED NOT DETECTED Final   Pseudomonas aeruginosa NOT DETECTED NOT DETECTED Final   Stenotrophomonas maltophilia NOT DETECTED NOT DETECTED Final   Candida albicans NOT DETECTED NOT DETECTED Final   Candida auris NOT DETECTED NOT DETECTED Final   Candida glabrata NOT DETECTED NOT DETECTED Final   Candida krusei NOT DETECTED NOT DETECTED Final   Candida parapsilosis NOT DETECTED NOT DETECTED Final   Candida tropicalis NOT DETECTED NOT DETECTED Final   Cryptococcus neoformans/gattii NOT DETECTED NOT DETECTED Final   CTX-M ESBL NOT DETECTED NOT DETECTED Final   Carbapenem resistance IMP NOT DETECTED NOT DETECTED Final   Carbapenem resistance KPC NOT DETECTED NOT DETECTED Final    Carbapenem resistance NDM NOT DETECTED NOT DETECTED Final   Carbapenem resist OXA 48 LIKE NOT DETECTED NOT DETECTED Final   Carbapenem resistance VIM NOT DETECTED NOT DETECTED Final    Comment: Performed at [facility], [address] [place], [place] [address]  Blood Culture (routine x 2)     Status: None (Preliminary result)   Collection Time: 12/19/20  2:55 PM   Specimen: Left Antecubital; Blood  Result Value Ref Range Status   Specimen Description LEFT ANTECUBITAL  Final   Special Requests   Final    BOTTLES DRAWN AEROBIC AND ANAEROBIC Blood Culture adequate volume   Culture   Final    NO GROWTH 2 DAYS Performed at [facility], [address] [place] [address] [place], [place] [address]    Report Status PENDING  Incomplete  Blood Culture (routine x 2)     Status: None (Preliminary result)   Collection Time: 12/19/20  2:55 PM   Specimen: Right Antecubital; Blood  Result Value Ref Range Status   Specimen Description RIGHT ANTECUBITAL  Final   Special Requests   Final    BOTTLES DRAWN AEROBIC AND ANAEROBIC Blood Culture adequate volume   Culture   Final    NO GROWTH 2 DAYS Performed at [facility], [address], [place], [place] [address]    Report Status PENDING  Incomplete  Resp Panel by RT-PCR (Flu A&B, Covid) Nasopharyngeal Swab     Status: None   Collection Time: 12/19/20  3:42 PM   Specimen: Nasopharyngeal Swab; Nasopharyngeal(NP) swabs in vial transport medium  Result Value Ref Range Status   SARS Coronavirus 2 by RT PCR NEGATIVE NEGATIVE Final    Comment: (NOTE) SARS-CoV-2 target nucleic acids are NOT DETECTED.  The SARS-CoV-2 RNA is generally detectable in upper respiratory specimens during the acute phase of infection. The lowest concentration of SARS-CoV-2 viral copies this assay can detect is 138 copies/mL. A negative result does not preclude SARS-Cov-2 infection and should not be used as the sole basis for treatment or other patient management decisions. A negative  result may occur with  improper specimen collection/handling, submission of specimen other than nasopharyngeal swab, presence of viral mutation(s) within the areas targeted by this assay, and inadequate number of viral copies(<138 copies/mL). A negative result must be combined with clinical observations, patient history, and epidemiological information. The expected result is Negative.  Fact Sheet for Patients:  EntrepreneurPulse.com.au  Fact Sheet for Healthcare Providers:  IncredibleEmployment.be  This test is no t yet approved or cleared by the [place] FDA and  has been authorized for detection and/or diagnosis of SARS-CoV-2 by FDA under an Emergency Use Authorization (EUA). This EUA will remain  in effect (meaning this test can be used) for the duration of the COVID-19 declaration under Section 564(b)(1) of the Act, 21 U.S.C.section 360bbb-3(b)(1), unless the authorization is terminated  or revoked sooner.       Influenza A by PCR NEGATIVE NEGATIVE Final   Influenza B by PCR NEGATIVE NEGATIVE Final    Comment: (NOTE) The Xpert Xpress SARS-CoV-2/FLU/RSV plus assay is intended as an aid in the diagnosis of influenza from Nasopharyngeal swab specimens and should not be used as a sole basis for treatment. Nasal washings and aspirates are unacceptable for Xpert Xpress SARS-CoV-2/FLU/RSV testing.  Fact Sheet for Patients: EntrepreneurPulse.com.au  Fact Sheet for Healthcare Providers: IncredibleEmployment.be  This test is not yet approved or cleared by the [place] FDA and has been authorized for detection and/or diagnosis of SARS-CoV-2 by FDA under an Emergency Use Authorization (EUA). This EUA will remain in effect (meaning this test can be used) for the duration of the COVID-19 declaration under Section 564(b)(1) of the Act, 21 U.S.C. section 360bbb-3(b)(1), unless the  authorization is  terminated or revoked.  Performed at [facility], [address], [place], [place] [address]   MRSA Next Gen by PCR, Nasal     Status: None   Collection Time: 12/19/20  8:20 PM   Specimen: Nasal Mucosa; Nasal Swab  Result Value Ref Range Status   MRSA by PCR Next Gen NOT DETECTED NOT DETECTED Final    Comment: (NOTE) The GeneXpert MRSA Assay (FDA approved for NASAL specimens only), is one component of a comprehensive MRSA colonization surveillance program. It is not intended to diagnose MRSA infection nor to guide or monitor treatment for MRSA infections. Test performance is not FDA approved in patients less than 55 years old. Performed at [facility], [address] [place], [place] [address]          Radiology Studies: CT Angio Chest PE W and/or Wo Contrast  Result Date: 12/19/2020 CLINICAL DATA:  Current chemotherapy for lung cancer. Shortness of breath began 3 days ago. EXAM: CT ANGIOGRAPHY CHEST WITH CONTRAST TECHNIQUE: Multidetector CT imaging of the chest was performed using the standard protocol during bolus administration of intravenous contrast. Multiplanar CT image reconstructions and MIPs were obtained to evaluate the vascular anatomy. CONTRAST:  19mL OMNIPAQUE IOHEXOL 350 MG/ML SOLN COMPARISON:  PET-CT 10/13/2020 and CTA chest from 07/26/2020 FINDINGS: Cardiovascular: Delayed but adequate bolus for pulmonary embolus assessment. No filling defect is identified in the pulmonary arterial tree to suggest pulmonary embolus. Coronary, aortic arch, and branch vessel atherosclerotic vascular disease. Borderline cardiomegaly. Mediastinum/Nodes: No pathologic adenopathy identified. Lungs/Pleura: Emphysema noted. There is some new ground-glass opacities in the upper lobes, 1 of the most conspicuous a 2.3 by 1.8 cm ground-glass density in the left upper lobe on image 54 series 6. These are nonspecific but may reflect alveolitis or atypical pneumonia. Substantially reduced right lower  lobe peribronchovascular nodularity, the area of nodularity currently measures about 1.2 by 0.7 cm on image 83 series 6, previously 2.4 by 1.5 cm on 07/26/2020. Upper Abdomen: We do not include the level of the prior pancreaticoduodenal groove lesion. Musculoskeletal: Mild lower thoracic spondylosis. Review of the MIP images confirms the above findings. IMPRESSION: 1. No filling defect is identified in the pulmonary arterial tree to suggest pulmonary embolus. 2. There is some patchy ground-glass opacities in the upper lobes suggesting alveolitis or atypical pneumonia. COVID pneumonia can sometimes have a similar appearance, and testing may be appropriate. 3. Aortic Atherosclerosis (ICD10-I70.0) and Emphysema (ICD10-J43.9). Coronary atherosclerosis. Borderline cardiomegaly. 4. The right lower lobe nodule is substantially reduced in size compared to 10/13/2020 and 07/26/2020, indicating response to therapy. Electronically Signed   By: Van Clines M.D.   On: 12/19/2020 17:03   US RENAL  Result Date: 12/20/2020 CLINICAL DATA:  UTI EXAM: RENAL / URINARY TRACT ULTRASOUND COMPLETE COMPARISON:  None. FINDINGS: Right Kidney: Renal measurements: 13.3 x 5.5 x 6.7 cm = volume: 259 mL. Echogenicity within normal limits. No mass or hydronephrosis visualized. Left Kidney: Renal measurements: 12.9 x 6.5 x 8.1 cm = volume: 356 mL. Echogenicity within normal limits. No mass or hydronephrosis visualized. Bladder: Appears normal for degree of bladder distention. Other: None. IMPRESSION: Unremarkable examination. Electronically Signed   By: Ofilia Neas M.D.   On: 12/20/2020 10:31   DG Chest Port 1 View  Result Date: 12/19/2020 CLINICAL DATA:  Sepsis. EXAM: PORTABLE CHEST 1 VIEW COMPARISON:  12/18/2020 FINDINGS: The cardiac silhouette, mediastinal and hilar contours are normal. The lungs are clear. No pleural effusions. No pulmonary lesions. The right IJ  power port is stable. The bony structures are intact.  IMPRESSION: No acute cardiopulmonary findings. Electronically Signed   By: Marijo Sanes M.D.   On: 12/19/2020 15:45        Scheduled Meds:  aspirin EC  81 mg Oral Daily   atorvastatin  20 mg Oral QHS   Chlorhexidine Gluconate Cloth  6 each Topical Daily   docusate sodium  100 mg Oral BID   DULoxetine  120 mg Oral QHS   fluticasone  2 spray Each Nare Daily   fluticasone furoate-vilanterol  1 puff Inhalation Daily   And   umeclidinium bromide  1 puff Inhalation Daily   levothyroxine  125 mcg Oral QAC breakfast   loratadine  10 mg Oral Daily   magnesium oxide  800 mg Oral BID   nicotine  14 mg Transdermal Daily   pantoprazole  40 mg Oral Daily   potassium chloride  40 mEq Oral BID   sodium chloride flush  3 mL Intravenous Q12H   triamterene-hydrochlorothiazide  1 tablet Oral Daily   Continuous Infusions:  cefTRIAXone (ROCEPHIN)  IV Stopped (12/20/20 1535)   magnesium sulfate bolus IVPB 50 mL/hr at 12/21/20 1103     LOS: 2 days    Time spent: 35 minutes    Barb Merino, MD [facility] Pager [phone]

## 2020-12-22 LAB — COMPREHENSIVE METABOLIC PANEL
ALT: 25 U/L (ref 0–44)
AST: 27 U/L (ref 15–41)
Albumin: 2.3 g/dL — ABNORMAL LOW (ref 3.5–5.0)
Alkaline Phosphatase: 125 U/L (ref 38–126)
Anion gap: 8 (ref 5–15)
BUN: 8 mg/dL (ref 8–23)
CO2: 24 mmol/L (ref 22–32)
Calcium: 8.4 mg/dL — ABNORMAL LOW (ref 8.9–10.3)
Chloride: 103 mmol/L (ref 98–111)
Creatinine, Ser: 0.78 mg/dL (ref 0.44–1.00)
GFR, Estimated: >60 mL/min (ref >60)
Glucose, Bld: 113 mg/dL — ABNORMAL HIGH (ref 70–99)
Potassium: 5 mmol/L (ref 3.5–5.1)
Sodium: 135 mmol/L (ref 135–145)
Total Bilirubin: 0.5 mg/dL (ref 0.3–1.2)
Total Protein: 5.5 g/dL — ABNORMAL LOW (ref 6.5–8.1)

## 2020-12-22 MED ORDER — LEVOTHYROXINE SODIUM 125 MCG PO TABS: 125.0000 ug | ORAL_TABLET | Freq: Every day | ORAL | 0 refills | Status: DC

## 2020-12-22 MED ORDER — CEPHALEXIN 500 MG PO CAPS: 500.0000 mg | ORAL_CAPSULE | Freq: Four times a day (QID) | ORAL | 0 refills | Status: AC

## 2020-12-22 MED ORDER — HEPARIN SOD (PORK) LOCK FLUSH 100 UNIT/ML IV SOLN
500.0000 [IU] | Freq: Once | INTRAVENOUS | Status: AC
  Administered 2020-12-22: 500 [IU] via INTRAVENOUS
  Filled 2020-12-22: qty 5

## 2020-12-22 MED ORDER — SODIUM CHLORIDE 0.9 % IV SOLN
2.0000 g | Freq: Once | INTRAVENOUS | Status: AC
  Administered 2020-12-22: 2 g via INTRAVENOUS
  Filled 2020-12-22: qty 20

## 2020-12-22 MED ORDER — MAGNESIUM OXIDE -MG SUPPLEMENT 400 (240 MG) MG PO TABS
400.0000 mg | ORAL_TABLET | Freq: Every day | ORAL | 0 refills | Status: AC
Start: 2020-12-22 — End: 2021-01-05

## 2020-12-22 NOTE — Evaluation (Addendum)
Physical Therapy Evaluation Patient Details Name: [name] MRN: [id number] DOB: March 22, 1955 Today's Date: 12/22/2020  History of Present Illness  [name] is a 65 y.o. female with medical history significant of chronic back pain, COPD, non-small cell lung cancer status postsecond dose of chemo last week, home oxygen use, visual reflux disease, hypertension, and diabetes type 2 (resolved since 2021) who presented to the emergency department yesterday feeling poorly week with a cough she was tachycardic and had an elevated white blood cell count. Pt found to be positive for e-coli   Clinical Impression  Patient presents in bed awake, alert, and agreeable for therapy. Patient functioning at baseline for functional mobility and gait and demonstrates good return for transferring to/from commode in bathroom and ambulating in hallway without loss of balance without use of an AD. Patient tolerated sitting up at bedside after therapy to eat breakfast. Patient discharged to care of nursing for ambulation daily as tolerated for length of stay.      Recommendations for follow up therapy are one component of a multi-disciplinary discharge planning process, led by the attending physician.  Recommendations may be updated based on patient status, additional functional criteria and insurance authorization.  Follow Up Recommendations No PT follow up    Assistance Recommended at Discharge None  Functional Status Assessment Patient has not had a recent decline in their functional status  Equipment Recommendations  None recommended by PT    Recommendations for Other Services       Precautions / Restrictions Precautions Precautions: None Restrictions Weight Bearing Restrictions: No      Mobility  Bed Mobility Overal bed mobility: Modified Independent             General bed mobility comments: Increased time    Transfers Overall transfer level: Modified independent Equipment used:  None               General transfer comment: Increased time    Ambulation/Gait Ambulation/Gait assistance: Modified independent (Device/Increase time) Gait Distance (Feet): 100 Feet Assistive device: None Gait Pattern/deviations: WFL(Within Functional Limits) Gait velocity: At baseline     General Gait Details: Required 2 standing rest breaks due to COPD, at baseline  Stairs            Wheelchair Mobility    Modified Rankin (Stroke Patients Only)       Balance Overall balance assessment: Independent                                           Pertinent Vitals/Pain Pain Assessment: No/denies pain    Home Living Family/patient expects to be discharged to:: Private residence Living Arrangements: Spouse/significant other Available Help at Discharge: Family Type of Home: House Home Access: Stairs to enter Entrance Stairs-Rails: Can reach both Entrance Stairs-Number of Steps: 6   Home Layout: One level Home Equipment: Conservation officer, nature (2 wheels);Standard Walker;BSC/3in1;Shower seat;Grab bars - tub/shower      Prior Function Prior Level of Function : Independent/Modified Independent             Mobility Comments: Uses RW occasionally due to COPD and B knee pain/giving way. ADLs Comments: Independent     Hand Dominance   Dominant Hand: Right    Extremity/Trunk Assessment   Upper Extremity Assessment Upper Extremity Assessment: Defer to OT evaluation    Lower Extremity Assessment Lower Extremity Assessment: Overall WFL for  tasks assessed    Cervical / Trunk Assessment Cervical / Trunk Assessment: Normal  Communication   Communication: No difficulties  Cognition Arousal/Alertness: Awake/alert Behavior During Therapy: WFL for tasks assessed/performed Overall Cognitive Status: Within Functional Limits for tasks assessed                                          General Comments      Exercises      Assessment/Plan    PT Assessment Patient does not need any further PT services  PT Problem List         PT Treatment Interventions      PT Goals (Current goals can be found in the Care Plan section)  Acute Rehab PT Goals Patient Stated Goal: Return home. PT Goal Formulation: With patient Time For Goal Achievement: 12/22/20 Potential to Achieve Goals: Good    Frequency     Barriers to discharge        Co-evaluation PT/OT/SLP Co-Evaluation/Treatment: Yes Reason for Co-Treatment: To address functional/ADL transfers PT goals addressed during session: Mobility/safety with mobility OT goals addressed during session: ADL's and self-care;Proper use of Adaptive equipment and DME       AM-PAC PT "6 Clicks" Mobility  Outcome Measure Help needed turning from your back to your side while in a flat bed without using bedrails?: None Help needed moving from lying on your back to sitting on the side of a flat bed without using bedrails?: None Help needed moving to and from a bed to a chair (including a wheelchair)?: None Help needed standing up from a chair using your arms (e.g., wheelchair or bedside chair)?: None Help needed to walk in hospital room?: None Help needed climbing 3-5 steps with a railing? : None 6 Click Score: 24    End of Session   Activity Tolerance: Patient tolerated treatment well Patient left: in bed;with call bell/phone within reach Nurse Communication: Mobility status PT Visit Diagnosis: Unsteadiness on feet (R26.81);Other abnormalities of gait and mobility (R26.89);Muscle weakness (generalized) (M62.81)    Time: 8416-6063 PT Time Calculation (min) (ACUTE ONLY): 14 min   Charges:   PT Evaluation $PT Eval Low Complexity: 1 Low PT Treatments $Therapeutic Activity: 8-22 mins        Cassie Jones, SPT  During this treatment session, the therapist was present, participating in and directing the treatment.  11:53 AM, 12/22/20 Lonell Grandchild,  MPT Physical Therapist with [facility] [phone] office [phone] mobile phone

## 2020-12-22 NOTE — Care Management Important Message (Signed)
Important Message  Patient Details  Name: [name] MRN: [id number] Date of Birth: [birth year]   Medicare Important Message Given:  Other (see comment)  Attempted to reach patient via room phone to review Medicare IM.  Unable to reach upon attempt.    Dannette Barbara 12/22/2020, 3:23 PM

## 2020-12-22 NOTE — Evaluation (Signed)
Occupational Therapy Evaluation Patient Details Name: [name] MRN: [id number] DOB: [birth year] Today's Date: 12/22/2020   History of Present Illness [name] is a 65 y.o. female with medical history significant of chronic back pain, COPD, non-small cell lung cancer status postsecond dose of chemo last week, home oxygen use, visual reflux disease, hypertension, and diabetes type 2 (resolved since 2021) who presented to the emergency department yesterday feeling poorly week with a cough she was tachycardic and had an elevated white blood cell count. Pt found to be positive for e-coli   Clinical Impression   Pt agreeable to OT/PT co-evaluation, pt demonstrating ADL completion and functional mobility at modified independent level. Pt reports she feels like she is completing tasks at her normal level. No further OT services required at this time.       Recommendations for follow up therapy are one component of a multi-disciplinary discharge planning process, led by the attending physician.  Recommendations may be updated based on patient status, additional functional criteria and insurance authorization.   Follow Up Recommendations  No OT follow up    Assistance Recommended at Discharge PRN  Functional Status Assessment  Patient has had a recent decline in their functional status and demonstrates the ability to make significant improvements in function in a reasonable and predictable amount of time.  Equipment Recommendations  None recommended by OT       Precautions / Restrictions Precautions Precautions: None Restrictions Weight Bearing Restrictions: No      Mobility Bed Mobility Overal bed mobility: Modified Independent                  Transfers Overall transfer level: Modified independent Equipment used: None                          ADL either performed or assessed with clinical judgement   ADL Overall ADL's : Modified independent;At  baseline Eating/Feeding: Independent;Sitting   Grooming: Wash/dry hands;Modified independent;Standing Grooming Details (indicate cue type and reason): pt washing hands while standing at sink, no difficulty with balance             Lower Body Dressing: Moderate assistance;Sitting/lateral leans Lower Body Dressing Details (indicate cue type and reason): pt requesting assist for donning socks as she had just washed her hands Toilet Transfer: Supervision/safety;Ambulation;Regular Museum/gallery exhibitions officer and Hygiene: Modified independent;Sitting/lateral lean;Sit to/from stand       Functional mobility during ADLs: Supervision/safety       Vision Baseline Vision/History: 1 Wears glasses Ability to See in Adequate Light: 1 Impaired Patient Visual Report: No change from baseline Vision Assessment?: No apparent visual deficits            Pertinent Vitals/Pain Pain Assessment: No/denies pain     Hand Dominance Right   Extremity/Trunk Assessment Upper Extremity Assessment Upper Extremity Assessment: Overall WFL for tasks assessed   Lower Extremity Assessment Lower Extremity Assessment: Defer to PT evaluation   Cervical / Trunk Assessment Cervical / Trunk Assessment: Normal   Communication Communication Communication: No difficulties   Cognition Arousal/Alertness: Awake/alert Behavior During Therapy: WFL for tasks assessed/performed Overall Cognitive Status: Within Functional Limits for tasks assessed  Home Living Family/patient expects to be discharged to:: Private residence Living Arrangements: Spouse/significant other Available Help at Discharge: Family Type of Home: House Home Access: Stairs to enter Technical brewer of Steps: 6 Entrance Stairs-Rails: Can reach both [place]: One level     Bathroom Shower/Tub: Occupational psychologist: [place]: Conservation officer, nature (2 wheels);Standard Walker;BSC/3in1;Shower seat;Grab bars - tub/shower          Prior Functioning/Environment Prior Level of Function : Independent/Modified Independent                        OT Problem List: Decreased activity tolerance                       Co-evaluation PT/OT/SLP Co-Evaluation/Treatment: Yes Reason for Co-Treatment: Complexity of the patient's impairments (multi-system involvement)   OT goals addressed during session: ADL's and self-care;Proper use of Adaptive equipment and DME         End of Session    Activity Tolerance: Patient tolerated treatment well Patient left: in bed;with call bell/phone within reach  OT Visit Diagnosis: Muscle weakness (generalized) (M62.81)                Time: 7622-6333 OT Time Calculation (min): 10 min Charges:  OT General Charges $OT Visit: 1 Visit OT Evaluation $OT Eval Low Complexity: 1 Low    Guadelupe Sabin, OTR/L  [phone] 12/22/2020, 9:17 AM

## 2020-12-22 NOTE — Progress Notes (Signed)
Patient's port to right chest flushed and locked with heparin. Needle removed and gauze dressing applied.

## 2020-12-22 NOTE — Discharge Summary (Signed)
Physician Discharge Summary  [name] SJG:[id number] DOB: 01/09/1956 DOA: 12/19/2020  PCP: Fredirick Lathe, PA-C  Admit date: 12/19/2020 Discharge date: 12/22/2020  Admitted From: Home Disposition: Home  Recommendations for Outpatient Follow-up:  Follow up with PCP in 1-2 weeks Please obtain BMP/CBC/ mag in one week Follow-up with your cancer doctor as a scheduled  Home Health: N/A Equipment/Devices: N/A  Discharge Condition: Stable CODE STATUS: Full code Diet recommendation: Regular diet  Discharge summary: 65 year old with history of chronic back pain, COPD, non-small cell lung cancer status post chemotherapy last chemotherapy a week ago, on home oxygen with chronic hypoxemia at night , hypertension, type 2 diabetes not on treatment presented to the emergency department feeling poorly, weak and dry cough on 11/21.  She was treated as sepsis with IV fluids and broad-spectrum antibiotics and advised admission to the hospital, however patient went home.  Blood cultures came back positive for E. coli and she was asked to come back to the hospital.  Urine was grossly abnormal.     # Sepsis present on admission secondary to E. coli bacteremia suspected E. coli UTI: Renal ultrasound with no evidence of retention, hydronephrosis. Unfortunately urine culture was collected after starting antibiotics.  We will treat as urinary source. Patient received 4 doses of IV antibiotics.  E. coli is pansensitive.  10 more days of Keflex by mouth.  Adequately improved and stabilized.   # Non-small cell lung cancer on chemotherapy followed by outpatient oncology. Chronic hypoxemic respiratory failure secondary to lung cancer on 2 to 3 L of oxygen at home at night. Suspected atypical pneumonia, however less likely.  Discontinue azithromycin.   # Obstructive sleep apnea: Uses CPAP at night.  # Hypothyroidism: On levothyroxine.  TSH 0.095.  Excessively suppressed.  Reduced dose of thyroxine to  125 mcg daily from 150 mcg daily.  We will need recheck in 4 weeks.  Thrombocytopenia: Is stabilizing. Electrolytes.  Potassium was replaced and adequate.  She is already on potassium replacement.  Magnesium was replaced IV.  Will prescribe 2 weeks of magnesium oxide 400 mg daily.  Patient is adequately stabilized.  She is able to go home today with oral antibiotics.   Discharge Diagnoses:  Principal Problem:   E. coli sepsis ([place]) Active Problems:   Hyperlipidemia   Anxiety state   Cigarette smoker   Hypothyroidism   OSA (obstructive sleep apnea)   Chronic respiratory failure with hypoxia (HCC)   Small cell lung cancer [facility])    Discharge Instructions  Discharge Instructions     Call MD for:  extreme fatigue   Complete by: As directed    Call MD for:  temperature >100.4   Complete by: As directed    Diet general   Complete by: As directed    Increase activity slowly   Complete by: As directed       Allergies as of 12/22/2020       Reactions   Metformin And Related Diarrhea   Nsaids Diarrhea   Aleve [naproxen Sodium] Other (See Comments)   Headache   Codeine Nausea Only, Other (See Comments)   GI upset   Penicillins Nausea Only, Other (See Comments)   GI upset   Sulfonamide Derivatives Hives        Medication List     STOP taking these medications    GOODY HEADACHE PO       TAKE these medications    albuterol 108 (90 Base) MCG/ACT inhaler Commonly known as: ProAir HFA  2 puffs every 4 hours as needed only  if your can't catch your breath   ALPRAZolam 1 MG tablet Commonly known as: XANAX Take 1 tablet po TID as needed for anxiety.   aspirin EC 81 MG tablet Take 1 tablet (81 mg total) by mouth daily. Swallow whole.   atorvastatin 20 MG tablet Commonly known as: LIPITOR Take 1 tablet (20 mg total) by mouth at bedtime.   cephALEXin 500 MG capsule Commonly known as: KEFLEX Take 1 capsule (500 mg total) by mouth 4 (four) times daily for 10  days.   cyclobenzaprine 10 MG tablet Commonly known as: FLEXERIL Take 1 tablet (10 mg total) by mouth 3 (three) times daily as needed for muscle spasms.   DULoxetine 60 MG capsule Commonly known as: Cymbalta Take 2 capsules (120 mg total) by mouth daily. What changed: when to take this   fluticasone 50 MCG/ACT nasal spray Commonly known as: FLONASE Place 2 sprays into both nostrils daily.   furosemide 20 MG tablet Commonly known as: LASIX TAKE 1 TABLET BY MOUTH EVERY DAY AS NEEDED   glucose blood test strip Use as instructed   HYDROcodone-acetaminophen 10-325 MG tablet Commonly known as: NORCO TAKE 1 TABLET BY MOUTH EVERY 6 HOURS AS NEEDED   levothyroxine 125 MCG tablet Commonly known as: SYNTHROID Take 1 tablet (125 mcg total) by mouth daily before breakfast. Start taking on: December 23, 2020 What changed:  medication strength how much to take   Lidocaine 4 % Ptch Place 1 patch onto the skin daily as needed (pain.).   loratadine 10 MG tablet Commonly known as: CLARITIN Take 10 mg by mouth daily.   magnesium oxide 400 (240 Mg) MG tablet Commonly known as: MAG-OX Take 1 tablet (400 mg total) by mouth daily for 14 days.   omeprazole 40 MG capsule Commonly known as: PRILOSEC TAKE ONE CAPSULE BY MOUTH TWICE DAILY   ondansetron 8 MG tablet Commonly known as: ZOFRAN Take 1 tablet (8 mg total) by mouth every 8 (eight) hours as needed.   OXYGEN Inhale 3 L into the lungs at bedtime. Uses At Night   potassium chloride 10 MEQ tablet Commonly known as: KLOR-CON Take 1 tablet (10 mEq total) by mouth at bedtime.   prochlorperazine 10 MG tablet Commonly known as: COMPAZINE Take 1 tablet (10 mg total) by mouth every 6 (six) hours as needed for nausea or vomiting.   Trelegy Ellipta 200-62.5-25 MCG/ACT Aepb Generic drug: Fluticasone-Umeclidin-Vilant Inhale 1 puff into the lungs daily.   triamterene-hydrochlorothiazide 37.5-25 MG tablet Commonly known as:  MAXZIDE-25 Take 1 tablet by mouth daily.        Follow-up Information     Allwardt, Alyssa M, PA-C Follow up in 2 week(s).   Specialty: Physician Assistant Contact information: [place] [address] [phone]                Allergies  Allergen Reactions   Metformin And Related Diarrhea   Nsaids Diarrhea   Aleve [Naproxen Sodium] Other (See Comments)    Headache    Codeine Nausea Only and Other (See Comments)    GI upset   Penicillins Nausea Only and Other (See Comments)    GI upset    Sulfonamide Derivatives Hives    Consultations: None   Procedures/Studies: DG Chest 2 View  Result Date: 12/18/2020 CLINICAL DATA:  Shortness of breath with weakness.  Fever. EXAM: CHEST - 2 VIEW COMPARISON:  03/05/2019 FINDINGS: Right-sided chest port in place  with distal tip terminating at the level of the distal SVC. The heart size and mediastinal contours are within normal limits. Atherosclerotic calcification of the aortic knob. No focal airspace consolidation, pleural effusion, or pneumothorax. The visualized skeletal structures are unremarkable. IMPRESSION: No active cardiopulmonary disease. Electronically Signed   By: Davina Poke D.O.   On: 12/18/2020 15:33   CT Angio Chest PE W and/or Wo Contrast  Result Date: 12/19/2020 CLINICAL DATA:  Current chemotherapy for lung cancer. Shortness of breath began 3 days ago. EXAM: CT ANGIOGRAPHY CHEST WITH CONTRAST TECHNIQUE: Multidetector CT imaging of the chest was performed using the standard protocol during bolus administration of intravenous contrast. Multiplanar CT image reconstructions and MIPs were obtained to evaluate the vascular anatomy. CONTRAST:  58mL OMNIPAQUE IOHEXOL 350 MG/ML SOLN COMPARISON:  PET-CT 10/13/2020 and CTA chest from 07/26/2020 FINDINGS: Cardiovascular: Delayed but adequate bolus for pulmonary embolus assessment. No filling defect is identified in the pulmonary arterial tree to  suggest pulmonary embolus. Coronary, aortic arch, and branch vessel atherosclerotic vascular disease. Borderline cardiomegaly. Mediastinum/Nodes: No pathologic adenopathy identified. Lungs/Pleura: Emphysema noted. There is some new ground-glass opacities in the upper lobes, 1 of the most conspicuous a 2.3 by 1.8 cm ground-glass density in the left upper lobe on image 54 series 6. These are nonspecific but may reflect alveolitis or atypical pneumonia. Substantially reduced right lower lobe peribronchovascular nodularity, the area of nodularity currently measures about 1.2 by 0.7 cm on image 83 series 6, previously 2.4 by 1.5 cm on 07/26/2020. Upper Abdomen: We do not include the level of the prior pancreaticoduodenal groove lesion. Musculoskeletal: Mild lower thoracic spondylosis. Review of the MIP images confirms the above findings. IMPRESSION: 1. No filling defect is identified in the pulmonary arterial tree to suggest pulmonary embolus. 2. There is some patchy ground-glass opacities in the upper lobes suggesting alveolitis or atypical pneumonia. COVID pneumonia can sometimes have a similar appearance, and testing may be appropriate. 3. Aortic Atherosclerosis (ICD10-I70.0) and Emphysema (ICD10-J43.9). Coronary atherosclerosis. Borderline cardiomegaly. 4. The right lower lobe nodule is substantially reduced in size compared to 10/13/2020 and 07/26/2020, indicating response to therapy. Electronically Signed   By: Van Clines M.D.   On: 12/19/2020 17:03   US RENAL  Result Date: 12/20/2020 CLINICAL DATA:  UTI EXAM: RENAL / URINARY TRACT ULTRASOUND COMPLETE COMPARISON:  None. FINDINGS: Right Kidney: Renal measurements: 13.3 x 5.5 x 6.7 cm = volume: 259 mL. Echogenicity within normal limits. No mass or hydronephrosis visualized. Left Kidney: Renal measurements: 12.9 x 6.5 x 8.1 cm = volume: 356 mL. Echogenicity within normal limits. No mass or hydronephrosis visualized. Bladder: Appears normal for degree of  bladder distention. Other: None. IMPRESSION: Unremarkable examination. Electronically Signed   By: Ofilia Neas M.D.   On: 12/20/2020 10:31   DG Chest Port 1 View  Result Date: 12/19/2020 CLINICAL DATA:  Sepsis. EXAM: PORTABLE CHEST 1 VIEW COMPARISON:  12/18/2020 FINDINGS: The cardiac silhouette, mediastinal and hilar contours are normal. The lungs are clear. No pleural effusions. No pulmonary lesions. The right IJ power port is stable. The bony structures are intact. IMPRESSION: No acute cardiopulmonary findings. Electronically Signed   By: Marijo Sanes M.D.   On: 12/19/2020 15:45   (Echo, Carotid, EGD, Colonoscopy, ERCP)    Subjective: Patient seen and examined.  No overnight events.  Denies any nausea vomiting.  Denies any fever.  She was able to mobilize with physical therapy and feels confident.  She does have urinary incontinence which is a chronic problem.  Eager to go home today.   Discharge Exam: Vitals:   12/22/20 0356 12/22/20 0957  BP: (!) 147/89   Pulse: 89   Resp: 18   Temp: 98.1 F (36.7 C)   SpO2: 96% 98%   Vitals:   12/21/20 2000 12/22/20 0000 12/22/20 0356 12/22/20 0957  BP: (!) 103/49 129/79 (!) 147/89   Pulse: 96 95 89   Resp:  17 18   Temp: 98.4 F (36.9 C) 98.3 F (36.8 C) 98.1 F (36.7 C)   TempSrc: Oral Oral Oral   SpO2: 95% 93% 96% 98%  Weight:      Height:        General: Pt is alert, awake, not in acute distress Looks comfortable.  She was eating breakfast. Cardiovascular: RRR, S1/S2 +, no rubs, no gallops Respiratory: CTA bilaterally, no wheezing, no rhonchi Patient has support on the right chest wall. Abdominal: Soft, NT, ND, bowel sounds + Extremities: no edema, no cyanosis    The results of significant diagnostics from this hospitalization (including imaging, microbiology, ancillary and laboratory) are listed below for reference.     Microbiology: Recent Results (from the past 240 hour(s))  Blood culture (routine x 2)      Status: Abnormal   Collection Time: 12/18/20  2:20 PM   Specimen: Left Antecubital; Blood  Result Value Ref Range Status   Specimen Description   Final    LEFT ANTECUBITAL Performed at [facility], [address] [place], [place] [address]    Special Requests   Final    BOTTLES DRAWN AEROBIC AND ANAEROBIC Blood Culture results may not be optimal due to an excessive volume of blood received in culture bottles Performed at [facility], [address], [place], [place] [address]    Culture  Setup Time   Final    IN BOTH AEROBIC AND ANAEROBIC BOTTLES GRAM NEGATIVE RODS Gram Stain Report Called to,Read Back By and Verified With: MHC DILLARD, A @0528  BY MATTHEWS, B 11.22.22 CRITICAL VALUE NOTED.  VALUE IS CONSISTENT WITH PREVIOUSLY REPORTED AND CALLED VALUE.    Culture (A)  Final    ESCHERICHIA COLI SUSCEPTIBILITIES PERFORMED ON PREVIOUS CULTURE WITHIN THE LAST 5 DAYS. Performed at [facility], [place] [address] [place], [place] [address]    Report Status 12/21/2020 FINAL  Final  Resp Panel by RT-PCR (Flu A&B, Covid) Nasopharyngeal Swab     Status: None   Collection Time: 12/18/20  2:43 PM   Specimen: Nasopharyngeal Swab; Nasopharyngeal(NP) swabs in vial transport medium  Result Value Ref Range Status   SARS Coronavirus 2 by RT PCR NEGATIVE NEGATIVE Final    Comment: (NOTE) SARS-CoV-2 target nucleic acids are NOT DETECTED.  The SARS-CoV-2 RNA is generally detectable in upper respiratory specimens during the acute phase of infection. The lowest concentration of SARS-CoV-2 viral copies this assay can detect is 138 copies/mL. A negative result does not preclude SARS-Cov-2 infection and should not be used as the sole basis for treatment or other patient management decisions. A negative result may occur with  improper specimen collection/handling, submission of specimen other than nasopharyngeal swab, presence of viral mutation(s) within the areas targeted by this assay, and  inadequate number of viral copies(<138 copies/mL). A negative result must be combined with clinical observations, patient history, and epidemiological information. The expected result is Negative.  Fact Sheet for Patients:  EntrepreneurPulse.com.au  Fact Sheet for Healthcare Providers:  IncredibleEmployment.be  This test is no t yet approved or cleared by the [place]  States FDA and  has been authorized for detection and/or diagnosis of SARS-CoV-2 by FDA under an Emergency Use Authorization (EUA). This EUA will remain  in effect (meaning this test can be used) for the duration of the COVID-19 declaration under Section 564(b)(1) of the Act, 21 U.S.C.section 360bbb-3(b)(1), unless the authorization is terminated  or revoked sooner.       Influenza A by PCR NEGATIVE NEGATIVE Final   Influenza B by PCR NEGATIVE NEGATIVE Final    Comment: (NOTE) The Xpert Xpress SARS-CoV-2/FLU/RSV plus assay is intended as an aid in the diagnosis of influenza from Nasopharyngeal swab specimens and should not be used as a sole basis for treatment. Nasal washings and aspirates are unacceptable for Xpert Xpress SARS-CoV-2/FLU/RSV testing.  Fact Sheet for Patients: EntrepreneurPulse.com.au  Fact Sheet for Healthcare Providers: IncredibleEmployment.be  This test is not yet approved or cleared by the [place] FDA and has been authorized for detection and/or diagnosis of SARS-CoV-2 by FDA under an Emergency Use Authorization (EUA). This EUA will remain in effect (meaning this test can be used) for the duration of the COVID-19 declaration under Section 564(b)(1) of the Act, 21 U.S.C. section 360bbb-3(b)(1), unless the authorization is terminated or revoked.  Performed at [facility], [address], [place], [place] [address]   Blood culture (routine x 2)     Status: Abnormal   Collection Time: 12/18/20  2:44 PM   Specimen:  Right Antecubital; Blood  Result Value Ref Range Status   Specimen Description   Final    RIGHT ANTECUBITAL Performed at [facility], [address], [place], [place] [address]    Special Requests   Final    BOTTLES DRAWN AEROBIC AND ANAEROBIC Blood Culture adequate volume Performed at [facility], [address] [place], [place] [address]    Culture  Setup Time   Final    IN BOTH AEROBIC AND ANAEROBIC BOTTLES GRAM NEGATIVE RODS Gram Stain Report Called to,Read Back By and Verified With: MHP Dillard, A@0527  by Zigmund Daniel, B 11.22.22  Organism ID to follow CRITICAL RESULT CALLED TO, READ BACK BY AND VERIFIED WITH: Bernita Raisin RN 12/19/20 1018 JDW Performed at [facility], [place] [address] [place], [place] [address]    Culture ESCHERICHIA COLI (A)  Final   Report Status 12/21/2020 FINAL  Final   Organism ID, Bacteria ESCHERICHIA COLI  Final      Susceptibility   Escherichia coli - MIC*    AMPICILLIN 4 SENSITIVE Sensitive     CEFAZOLIN <=4 SENSITIVE Sensitive     CEFEPIME <=0.12 SENSITIVE Sensitive     CEFTAZIDIME <=1 SENSITIVE Sensitive     CEFTRIAXONE <=0.25 SENSITIVE Sensitive     CIPROFLOXACIN <=0.25 SENSITIVE Sensitive     GENTAMICIN <=1 SENSITIVE Sensitive     IMIPENEM <=0.25 SENSITIVE Sensitive     TRIMETH/SULFA <=20 SENSITIVE Sensitive     AMPICILLIN/SULBACTAM <=2 SENSITIVE Sensitive     PIP/TAZO <=4 SENSITIVE Sensitive     * ESCHERICHIA COLI  Blood Culture ID Panel (Reflexed)     Status: Abnormal   Collection Time: 12/18/20  2:44 PM  Result Value Ref Range Status   Enterococcus faecalis NOT DETECTED NOT DETECTED Final   Enterococcus Faecium NOT DETECTED NOT DETECTED Final   Listeria monocytogenes NOT DETECTED NOT DETECTED Final   Staphylococcus species NOT DETECTED NOT DETECTED Final   Staphylococcus aureus (BCID) NOT DETECTED NOT DETECTED Final   Staphylococcus epidermidis NOT DETECTED NOT DETECTED Final   Staphylococcus lugdunensis NOT  DETECTED NOT DETECTED Final    Streptococcus species NOT DETECTED NOT DETECTED Final   Streptococcus agalactiae NOT DETECTED NOT DETECTED Final   Streptococcus pneumoniae NOT DETECTED NOT DETECTED Final   Streptococcus pyogenes NOT DETECTED NOT DETECTED Final   A.calcoaceticus-baumannii NOT DETECTED NOT DETECTED Final   Bacteroides fragilis NOT DETECTED NOT DETECTED Final   Enterobacterales DETECTED (A) NOT DETECTED Final    Comment: Enterobacterales represent a large order of gram negative bacteria, not a single organism. CRITICAL RESULT CALLED TO, READ BACK BY AND VERIFIED WITH: Bernita Raisin RN 12/19/20 1018 JDW    Enterobacter cloacae complex NOT DETECTED NOT DETECTED Final   Escherichia coli DETECTED (A) NOT DETECTED Final    Comment: CRITICAL RESULT CALLED TO, READ BACK BY AND VERIFIED WITH: Bernita Raisin RN 12/19/20 1018 JDW    Klebsiella aerogenes NOT DETECTED NOT DETECTED Final   Klebsiella oxytoca NOT DETECTED NOT DETECTED Final   Klebsiella pneumoniae NOT DETECTED NOT DETECTED Final   Proteus species NOT DETECTED NOT DETECTED Final   Salmonella species NOT DETECTED NOT DETECTED Final   Serratia marcescens NOT DETECTED NOT DETECTED Final   Haemophilus influenzae NOT DETECTED NOT DETECTED Final   Neisseria meningitidis NOT DETECTED NOT DETECTED Final   Pseudomonas aeruginosa NOT DETECTED NOT DETECTED Final   Stenotrophomonas maltophilia NOT DETECTED NOT DETECTED Final   Candida albicans NOT DETECTED NOT DETECTED Final   Candida auris NOT DETECTED NOT DETECTED Final   Candida glabrata NOT DETECTED NOT DETECTED Final   Candida krusei NOT DETECTED NOT DETECTED Final   Candida parapsilosis NOT DETECTED NOT DETECTED Final   Candida tropicalis NOT DETECTED NOT DETECTED Final   Cryptococcus neoformans/gattii NOT DETECTED NOT DETECTED Final   CTX-M ESBL NOT DETECTED NOT DETECTED Final   Carbapenem resistance IMP NOT DETECTED NOT DETECTED Final   Carbapenem resistance KPC NOT DETECTED NOT DETECTED Final   Carbapenem  resistance NDM NOT DETECTED NOT DETECTED Final   Carbapenem resist OXA 48 LIKE NOT DETECTED NOT DETECTED Final   Carbapenem resistance VIM NOT DETECTED NOT DETECTED Final    Comment: Performed at [facility], [place] [address] [place], [place] [address]  Urine Culture     Status: None   Collection Time: 12/19/20 10:12 AM   Specimen: In/Out Cath Urine  Result Value Ref Range Status   Specimen Description   Final    IN/OUT CATH URINE Performed at [facility], [address], [place], [place] [address]    Special Requests   Final    NONE Performed at [facility], [address], [place], [place] [address]    Culture   Final    NO GROWTH Performed at [facility], [place] [address], [place], [place] [address]    Report Status 12/21/2020 FINAL  Final  Blood Culture (routine x 2)     Status: None (Preliminary result)   Collection Time: 12/19/20  2:55 PM   Specimen: Left Antecubital; Blood  Result Value Ref Range Status   Specimen Description LEFT ANTECUBITAL  Final   Special Requests   Final    BOTTLES DRAWN AEROBIC AND ANAEROBIC Blood Culture adequate volume   Culture   Final    NO GROWTH 3 DAYS Performed at [facility], [address] [place], [place] [address]    Report Status PENDING  Incomplete  Blood Culture (routine x 2)     Status: None (Preliminary result)   Collection Time: 12/19/20  2:55 PM   Specimen: Right  Antecubital; Blood  Result Value Ref Range Status   Specimen Description RIGHT ANTECUBITAL  Final   Special Requests   Final    BOTTLES DRAWN AEROBIC AND ANAEROBIC Blood Culture adequate volume   Culture   Final    NO GROWTH 3 DAYS Performed at [facility], [address], [place], [place] [address]    Report Status PENDING  Incomplete  Resp Panel by RT-PCR (Flu A&B, Covid) Nasopharyngeal Swab     Status: None   Collection Time: 12/19/20  3:42 PM   Specimen: Nasopharyngeal Swab; Nasopharyngeal(NP) swabs in vial transport medium  Result Value Ref Range  Status   SARS Coronavirus 2 by RT PCR NEGATIVE NEGATIVE Final    Comment: (NOTE) SARS-CoV-2 target nucleic acids are NOT DETECTED.  The SARS-CoV-2 RNA is generally detectable in upper respiratory specimens during the acute phase of infection. The lowest concentration of SARS-CoV-2 viral copies this assay can detect is 138 copies/mL. A negative result does not preclude SARS-Cov-2 infection and should not be used as the sole basis for treatment or other patient management decisions. A negative result may occur with  improper specimen collection/handling, submission of specimen other than nasopharyngeal swab, presence of viral mutation(s) within the areas targeted by this assay, and inadequate number of viral copies(<138 copies/mL). A negative result must be combined with clinical observations, patient history, and epidemiological information. The expected result is Negative.  Fact Sheet for Patients:  EntrepreneurPulse.com.au  Fact Sheet for Healthcare Providers:  IncredibleEmployment.be  This test is no t yet approved or cleared by the [place] FDA and  has been authorized for detection and/or diagnosis of SARS-CoV-2 by FDA under an Emergency Use Authorization (EUA). This EUA will remain  in effect (meaning this test can be used) for the duration of the COVID-19 declaration under Section 564(b)(1) of the Act, 21 U.S.C.section 360bbb-3(b)(1), unless the authorization is terminated  or revoked sooner.       Influenza A by PCR NEGATIVE NEGATIVE Final   Influenza B by PCR NEGATIVE NEGATIVE Final    Comment: (NOTE) The Xpert Xpress SARS-CoV-2/FLU/RSV plus assay is intended as an aid in the diagnosis of influenza from Nasopharyngeal swab specimens and should not be used as a sole basis for treatment. Nasal washings and aspirates are unacceptable for Xpert Xpress SARS-CoV-2/FLU/RSV testing.  Fact Sheet for  Patients: EntrepreneurPulse.com.au  Fact Sheet for Healthcare Providers: IncredibleEmployment.be  This test is not yet approved or cleared by the [place] FDA and has been authorized for detection and/or diagnosis of SARS-CoV-2 by FDA under an Emergency Use Authorization (EUA). This EUA will remain in effect (meaning this test can be used) for the duration of the COVID-19 declaration under Section 564(b)(1) of the Act, 21 U.S.C. section 360bbb-3(b)(1), unless the authorization is terminated or revoked.  Performed at [facility], [address] [place], [place] [address]   MRSA Next Gen by PCR, Nasal     Status: None   Collection Time: 12/19/20  8:20 PM   Specimen: Nasal Mucosa; Nasal Swab  Result Value Ref Range Status   MRSA by PCR Next Gen NOT DETECTED NOT DETECTED Final    Comment: (NOTE) The GeneXpert MRSA Assay (FDA approved for NASAL specimens only), is one component of a comprehensive MRSA colonization surveillance program. It is not intended to diagnose MRSA infection nor to guide or monitor treatment for MRSA infections. Test performance is not FDA approved in patients less than 58 years old. Performed at [facility], [place]  [address], [place], [place] [address]      Labs: BNP (last 3 results) Recent Labs    09/13/20 1416  BNP 15.4   Basic Metabolic Panel: Recent Labs  Lab 12/18/20 1443 12/19/20 1455 12/20/20 0426 12/21/20 0456 12/22/20 0550  NA 127* 129* 130* 133* 135  K 3.2* 2.9* 3.3* 4.4 5.0  CL 96* 96* 102 103 103  CO2 21* 23 22 25 24   GLUCOSE 179* 108* 113* 94 113*  BUN 10 11 8 8 8   CREATININE 1.03* 1.10* 0.79 0.79 0.78  CALCIUM 7.5* 7.5* 7.2* 7.6* 8.4*  MG  --  1.2*  --  1.5*  --   PHOS  --   --   --  3.0  --    Liver Function Tests: Recent Labs  Lab 12/18/20 1443 12/19/20 1455 12/21/20 0456 12/22/20 0550  AST 21 29 27 27   ALT 28 26 23 25   ALKPHOS 152* 160* 116 125  BILITOT 0.6 1.0 0.8 0.5  PROT  5.8* 6.0* 5.0* 5.5*  ALBUMIN 2.7* 2.7* 2.1* 2.3*   Recent Labs  Lab 12/18/20 1443  LIPASE 13   No results for input(s): AMMONIA in the last 168 hours. CBC: Recent Labs  Lab 12/18/20 1420 12/19/20 1455 12/20/20 0426 12/21/20 0456  WBC 19.8* 19.3* 12.8* 12.6*  NEUTROABS 17.2* 16.2*  --  9.2*  HGB 11.1* 11.2* 9.3* 9.1*  HCT 32.6* 33.9* 27.5* 27.6*  MCV 81.3 81.7 80.2 82.1  PLT 18* 21* 20* 36*   Cardiac Enzymes: No results for input(s): CKTOTAL, CKMB, CKMBINDEX, TROPONINI in the last 168 hours. BNP: Invalid input(s): POCBNP CBG: No results for input(s): GLUCAP in the last 168 hours. D-Dimer Recent Labs    12/19/20 1455  DDIMER 2.85*   Hgb A1c No results for input(s): HGBA1C in the last 72 hours. Lipid Profile No results for input(s): CHOL, HDL, LDLCALC, TRIG, CHOLHDL, LDLDIRECT in the last 72 hours. Thyroid function studies Recent Labs    12/19/20 2303  TSH 0.095*   Anemia work up No results for input(s): VITAMINB12, FOLATE, FERRITIN, TIBC, IRON, RETICCTPCT in the last 72 hours. Urinalysis    Component Value Date/Time   COLORURINE YELLOW 12/19/2020 1012   APPEARANCEUR CLEAR 12/19/2020 1012   LABSPEC 1.015 12/19/2020 1012   PHURINE 7.5 12/19/2020 1012   GLUCOSEU NEGATIVE 12/19/2020 1012   HGBUR SMALL (A) 12/19/2020 1012   BILIRUBINUR NEGATIVE 12/19/2020 1012   BILIRUBINUR Negative 07/27/2019 1155   KETONESUR NEGATIVE 12/19/2020 1012   PROTEINUR NEGATIVE 12/19/2020 1012   UROBILINOGEN 0.2 07/27/2019 1155   UROBILINOGEN 1.0 03/10/2008 0940   NITRITE NEGATIVE 12/19/2020 1012   LEUKOCYTESUR NEGATIVE 12/19/2020 1012   Sepsis Labs Invalid input(s): PROCALCITONIN,  WBC,  LACTICIDVEN Microbiology Recent Results (from the past 240 hour(s))  Blood culture (routine x 2)     Status: Abnormal   Collection Time: 12/18/20  2:20 PM   Specimen: Left Antecubital; Blood  Result Value Ref Range Status   Specimen Description   Final    LEFT ANTECUBITAL Performed at  [facility], [address], [place], [place] [address]    Special Requests   Final    BOTTLES DRAWN AEROBIC AND ANAEROBIC Blood Culture results may not be optimal due to an excessive volume of blood received in culture bottles Performed at [facility], [address] [place], [place] [address]    Culture  Setup Time   Final    IN BOTH AEROBIC AND ANAEROBIC BOTTLES GRAM NEGATIVE RODS Gram Stain Report Called  to,Read Back By and Verified With: MHC DILLARD, A @0528  BY MATTHEWS, B 11.22.22 CRITICAL VALUE NOTED.  VALUE IS CONSISTENT WITH PREVIOUSLY REPORTED AND CALLED VALUE.    Culture (A)  Final    ESCHERICHIA COLI SUSCEPTIBILITIES PERFORMED ON PREVIOUS CULTURE WITHIN THE LAST 5 DAYS. Performed at [facility], [place] [address], [place], [place] [address]    Report Status 12/21/2020 FINAL  Final  Resp Panel by RT-PCR (Flu A&B, Covid) Nasopharyngeal Swab     Status: None   Collection Time: 12/18/20  2:43 PM   Specimen: Nasopharyngeal Swab; Nasopharyngeal(NP) swabs in vial transport medium  Result Value Ref Range Status   SARS Coronavirus 2 by RT PCR NEGATIVE NEGATIVE Final    Comment: (NOTE) SARS-CoV-2 target nucleic acids are NOT DETECTED.  The SARS-CoV-2 RNA is generally detectable in upper respiratory specimens during the acute phase of infection. The lowest concentration of SARS-CoV-2 viral copies this assay can detect is 138 copies/mL. A negative result does not preclude SARS-Cov-2 infection and should not be used as the sole basis for treatment or other patient management decisions. A negative result may occur with  improper specimen collection/handling, submission of specimen other than nasopharyngeal swab, presence of viral mutation(s) within the areas targeted by this assay, and inadequate number of viral copies(<138 copies/mL). A negative result must be combined with clinical observations, patient history, and epidemiological information. The expected result is  Negative.  Fact Sheet for Patients:  EntrepreneurPulse.com.au  Fact Sheet for Healthcare Providers:  IncredibleEmployment.be  This test is no t yet approved or cleared by the [place] FDA and  has been authorized for detection and/or diagnosis of SARS-CoV-2 by FDA under an Emergency Use Authorization (EUA). This EUA will remain  in effect (meaning this test can be used) for the duration of the COVID-19 declaration under Section 564(b)(1) of the Act, 21 U.S.C.section 360bbb-3(b)(1), unless the authorization is terminated  or revoked sooner.       Influenza A by PCR NEGATIVE NEGATIVE Final   Influenza B by PCR NEGATIVE NEGATIVE Final    Comment: (NOTE) The Xpert Xpress SARS-CoV-2/FLU/RSV plus assay is intended as an aid in the diagnosis of influenza from Nasopharyngeal swab specimens and should not be used as a sole basis for treatment. Nasal washings and aspirates are unacceptable for Xpert Xpress SARS-CoV-2/FLU/RSV testing.  Fact Sheet for Patients: EntrepreneurPulse.com.au  Fact Sheet for Healthcare Providers: IncredibleEmployment.be  This test is not yet approved or cleared by the [place] FDA and has been authorized for detection and/or diagnosis of SARS-CoV-2 by FDA under an Emergency Use Authorization (EUA). This EUA will remain in effect (meaning this test can be used) for the duration of the COVID-19 declaration under Section 564(b)(1) of the Act, 21 U.S.C. section 360bbb-3(b)(1), unless the authorization is terminated or revoked.  Performed at [facility], [address], [place], [place] [address]   Blood culture (routine x 2)     Status: Abnormal   Collection Time: 12/18/20  2:44 PM   Specimen: Right Antecubital; Blood  Result Value Ref Range Status   Specimen Description   Final    RIGHT ANTECUBITAL Performed at [facility], [address] [place], [place] [address]     Special Requests   Final    BOTTLES DRAWN AEROBIC AND ANAEROBIC Blood Culture adequate volume Performed at [place] [place], [place] [address]    Culture  Setup Time   Final    IN BOTH AEROBIC AND ANAEROBIC  BOTTLES GRAM NEGATIVE RODS Gram Stain Report Called to,Read Back By and Verified With: MHP Dillard, A@0527  by Zigmund Daniel, B 11.22.22  Organism ID to follow CRITICAL RESULT CALLED TO, READ BACK BY AND VERIFIED WITH: Bernita Raisin RN 12/19/20 1018 JDW Performed at [facility], [place] [address] [place], [place] [address]    Culture ESCHERICHIA COLI (A)  Final   Report Status 12/21/2020 FINAL  Final   Organism ID, Bacteria ESCHERICHIA COLI  Final      Susceptibility   Escherichia coli - MIC*    AMPICILLIN 4 SENSITIVE Sensitive     CEFAZOLIN <=4 SENSITIVE Sensitive     CEFEPIME <=0.12 SENSITIVE Sensitive     CEFTAZIDIME <=1 SENSITIVE Sensitive     CEFTRIAXONE <=0.25 SENSITIVE Sensitive     CIPROFLOXACIN <=0.25 SENSITIVE Sensitive     GENTAMICIN <=1 SENSITIVE Sensitive     IMIPENEM <=0.25 SENSITIVE Sensitive     TRIMETH/SULFA <=20 SENSITIVE Sensitive     AMPICILLIN/SULBACTAM <=2 SENSITIVE Sensitive     PIP/TAZO <=4 SENSITIVE Sensitive     * ESCHERICHIA COLI  Blood Culture ID Panel (Reflexed)     Status: Abnormal   Collection Time: 12/18/20  2:44 PM  Result Value Ref Range Status   Enterococcus faecalis NOT DETECTED NOT DETECTED Final   Enterococcus Faecium NOT DETECTED NOT DETECTED Final   Listeria monocytogenes NOT DETECTED NOT DETECTED Final   Staphylococcus species NOT DETECTED NOT DETECTED Final   Staphylococcus aureus (BCID) NOT DETECTED NOT DETECTED Final   Staphylococcus epidermidis NOT DETECTED NOT DETECTED Final   Staphylococcus lugdunensis NOT DETECTED NOT DETECTED Final   Streptococcus species NOT DETECTED NOT DETECTED Final   Streptococcus agalactiae NOT DETECTED NOT DETECTED Final   Streptococcus pneumoniae NOT DETECTED NOT DETECTED Final    Streptococcus pyogenes NOT DETECTED NOT DETECTED Final   A.calcoaceticus-baumannii NOT DETECTED NOT DETECTED Final   Bacteroides fragilis NOT DETECTED NOT DETECTED Final   Enterobacterales DETECTED (A) NOT DETECTED Final    Comment: Enterobacterales represent a large order of gram negative bacteria, not a single organism. CRITICAL RESULT CALLED TO, READ BACK BY AND VERIFIED WITH: Bernita Raisin RN 12/19/20 1018 JDW    Enterobacter cloacae complex NOT DETECTED NOT DETECTED Final   Escherichia coli DETECTED (A) NOT DETECTED Final    Comment: CRITICAL RESULT CALLED TO, READ BACK BY AND VERIFIED WITH: Bernita Raisin RN 12/19/20 1018 JDW    Klebsiella aerogenes NOT DETECTED NOT DETECTED Final   Klebsiella oxytoca NOT DETECTED NOT DETECTED Final   Klebsiella pneumoniae NOT DETECTED NOT DETECTED Final   Proteus species NOT DETECTED NOT DETECTED Final   Salmonella species NOT DETECTED NOT DETECTED Final   Serratia marcescens NOT DETECTED NOT DETECTED Final   Haemophilus influenzae NOT DETECTED NOT DETECTED Final   Neisseria meningitidis NOT DETECTED NOT DETECTED Final   Pseudomonas aeruginosa NOT DETECTED NOT DETECTED Final   Stenotrophomonas maltophilia NOT DETECTED NOT DETECTED Final   Candida albicans NOT DETECTED NOT DETECTED Final   Candida auris NOT DETECTED NOT DETECTED Final   Candida glabrata NOT DETECTED NOT DETECTED Final   Candida krusei NOT DETECTED NOT DETECTED Final   Candida parapsilosis NOT DETECTED NOT DETECTED Final   Candida tropicalis NOT DETECTED NOT DETECTED Final   Cryptococcus neoformans/gattii NOT DETECTED NOT DETECTED Final   CTX-M ESBL NOT DETECTED NOT DETECTED Final   Carbapenem resistance IMP NOT DETECTED NOT DETECTED Final   Carbapenem resistance KPC NOT DETECTED NOT DETECTED Final   Carbapenem resistance NDM  NOT DETECTED NOT DETECTED Final   Carbapenem resist OXA 48 LIKE NOT DETECTED NOT DETECTED Final   Carbapenem resistance VIM NOT DETECTED NOT DETECTED Final     Comment: Performed at [facility], [place] [address], [place], [place] [address]  Urine Culture     Status: None   Collection Time: 12/19/20 10:12 AM   Specimen: In/Out Cath Urine  Result Value Ref Range Status   Specimen Description   Final    IN/OUT CATH URINE Performed at [facility], [address] [place], [place] [address]    Special Requests   Final    NONE Performed at [facility], [address], [place], [place] [address]    Culture   Final    NO GROWTH Performed at [facility], [place] [address] [place], [place] [address]    Report Status 12/21/2020 FINAL  Final  Blood Culture (routine x 2)     Status: None (Preliminary result)   Collection Time: 12/19/20  2:55 PM   Specimen: Left Antecubital; Blood  Result Value Ref Range Status   Specimen Description LEFT ANTECUBITAL  Final   Special Requests   Final    BOTTLES DRAWN AEROBIC AND ANAEROBIC Blood Culture adequate volume   Culture   Final    NO GROWTH 3 DAYS Performed at [facility], [address] [place], [place] [address]    Report Status PENDING  Incomplete  Blood Culture (routine x 2)     Status: None (Preliminary result)   Collection Time: 12/19/20  2:55 PM   Specimen: Right Antecubital; Blood  Result Value Ref Range Status   Specimen Description RIGHT ANTECUBITAL  Final   Special Requests   Final    BOTTLES DRAWN AEROBIC AND ANAEROBIC Blood Culture adequate volume   Culture   Final    NO GROWTH 3 DAYS Performed at [facility], [address] [place], [place] [address]    Report Status PENDING  Incomplete  Resp Panel by RT-PCR (Flu A&B, Covid) Nasopharyngeal Swab     Status: None   Collection Time: 12/19/20  3:42 PM   Specimen: Nasopharyngeal Swab; Nasopharyngeal(NP) swabs in vial transport medium  Result Value Ref Range Status   SARS Coronavirus 2 by RT PCR NEGATIVE NEGATIVE Final    Comment: (NOTE) SARS-CoV-2 target nucleic acids are NOT DETECTED.  The SARS-CoV-2 RNA is generally  detectable in upper respiratory specimens during the acute phase of infection. The lowest concentration of SARS-CoV-2 viral copies this assay can detect is 138 copies/mL. A negative result does not preclude SARS-Cov-2 infection and should not be used as the sole basis for treatment or other patient management decisions. A negative result may occur with  improper specimen collection/handling, submission of specimen other than nasopharyngeal swab, presence of viral mutation(s) within the areas targeted by this assay, and inadequate number of viral copies(<138 copies/mL). A negative result must be combined with clinical observations, patient history, and epidemiological information. The expected result is Negative.  Fact Sheet for Patients:  EntrepreneurPulse.com.au  Fact Sheet for Healthcare Providers:  IncredibleEmployment.be  This test is no t yet approved or cleared by the [place] FDA and  has been authorized for detection and/or diagnosis of SARS-CoV-2 by FDA under an Emergency Use Authorization (EUA). This EUA will remain  in effect (meaning this test can be used) for the duration of the COVID-19 declaration under Section 564(b)(1) of the Act, 21 U.S.C.section 360bbb-3(b)(1), unless the authorization is terminated  or revoked  sooner.       Influenza A by PCR NEGATIVE NEGATIVE Final   Influenza B by PCR NEGATIVE NEGATIVE Final    Comment: (NOTE) The Xpert Xpress SARS-CoV-2/FLU/RSV plus assay is intended as an aid in the diagnosis of influenza from Nasopharyngeal swab specimens and should not be used as a sole basis for treatment. Nasal washings and aspirates are unacceptable for Xpert Xpress SARS-CoV-2/FLU/RSV testing.  Fact Sheet for Patients: EntrepreneurPulse.com.au  Fact Sheet for Healthcare Providers: IncredibleEmployment.be  This test is not yet approved or cleared by the [place] FDA  and has been authorized for detection and/or diagnosis of SARS-CoV-2 by FDA under an Emergency Use Authorization (EUA). This EUA will remain in effect (meaning this test can be used) for the duration of the COVID-19 declaration under Section 564(b)(1) of the Act, 21 U.S.C. section 360bbb-3(b)(1), unless the authorization is terminated or revoked.  Performed at [facility], [address] [place], [place] [address]   MRSA Next Gen by PCR, Nasal     Status: None   Collection Time: 12/19/20  8:20 PM   Specimen: Nasal Mucosa; Nasal Swab  Result Value Ref Range Status   MRSA by PCR Next Gen NOT DETECTED NOT DETECTED Final    Comment: (NOTE) The GeneXpert MRSA Assay (FDA approved for NASAL specimens only), is one component of a comprehensive MRSA colonization surveillance program. It is not intended to diagnose MRSA infection nor to guide or monitor treatment for MRSA infections. Test performance is not FDA approved in patients less than 22 years old. Performed at [facility], [address] [place], [place] [address]      Time coordinating discharge:  40 minutes  SIGNED:   Barb Merino, MD  [facility] 12/22/2020, 11:17 AM

## 2020-12-24 LAB — CULTURE, BLOOD (ROUTINE X 2)
Culture: NO GROWTH
Culture: NO GROWTH
Special Requests: ADEQUATE
Special Requests: ADEQUATE

## 2020-12-25 ENCOUNTER — Inpatient Hospital Stay: Payer: Medicare HMO | Admitting: Hematology and Oncology

## 2020-12-25 ENCOUNTER — Inpatient Hospital Stay: Payer: Medicare HMO

## 2020-12-25 ENCOUNTER — Telehealth: Payer: Self-pay

## 2020-12-25 ENCOUNTER — Telehealth: Payer: Self-pay | Admitting: *Deleted

## 2020-12-25 NOTE — Telephone Encounter (Signed)
Received message from pt that she wants to cancel her appts for this week.. TCT patient and spoke to her. She states she was discharged from [facility] on Friday, 12/22/20. She states she still feels terrible. She c/o headache, fever of 100-101, runny nose. Sore throat. Was Covid and flu negative as in patient. Informed Dr. Lorenso Courier of her status. OK to cancel appts for this week.  Advised that we will reschedule for next week. Also asked to pt to call if she feels worse or no better this week.   She is on oral antibiotics s/p hospitalization.  Scheduling message sent for next weeks appts.

## 2020-12-25 NOTE — Telephone Encounter (Signed)
Transition Care Management Follow-up Telephone Call Date of discharge and from where: [facility] 12/22/20  How have you been since you were released from the hospital? Not feeling to much better Any questions or concerns? No  Items Reviewed: Did the pt receive and understand the discharge instructions provided? Yes  Medications obtained and verified? Yes  Other? No  Any new allergies since your discharge? No  Dietary orders reviewed? Yes Do you have support at home? Yes   Home Care and Equipment/Supplies: Were home health services ordered? not applicable If so, what is the name of the agency?  Has the agency set up a time to come to the patient's home? not applicable Were any new equipment or medical supplies ordered?  No What is the name of the medical supply agency?  Were you able to get the supplies/equipment? not applicable Do you have any questions related to the use of the equipment or supplies? No  Functional Questionnaire: (I = Independent and D = Dependent) ADLs: I  Bathing/Dressing- I  Meal Prep- I  Eating- I  Maintaining continence- I  Transferring/Ambulation- I  Managing Meds- I  Follow up appointments reviewed:  PCP Hospital f/u appt confirmed? Yes  Scheduled to see Alyssa Allwardt on 01/05/21@ 12:30. [facility] f/u appt confirmed? Yes  Scheduled to see Oncology  on next week Are transportation arrangements needed? No  If their condition worsens, is the pt aware to call PCP or go to the Emergency Dept.? Yes Was the patient provided with contact information for the PCP's office or ED? Yes Was to pt encouraged to call back with questions or concerns? Yes

## 2020-12-26 ENCOUNTER — Inpatient Hospital Stay: Payer: Medicare HMO

## 2020-12-27 ENCOUNTER — Inpatient Hospital Stay: Payer: Medicare HMO

## 2020-12-29 ENCOUNTER — Inpatient Hospital Stay: Payer: Medicare HMO

## 2020-12-29 DIAGNOSIS — J449 Chronic obstructive pulmonary disease, unspecified: Secondary | ICD-10-CM | POA: Diagnosis not present

## 2021-01-02 ENCOUNTER — Inpatient Hospital Stay: Payer: Medicare HMO

## 2021-01-02 ENCOUNTER — Other Ambulatory Visit: Payer: Self-pay | Admitting: Hematology and Oncology

## 2021-01-02 ENCOUNTER — Inpatient Hospital Stay: Payer: Medicare HMO | Attending: Hematology and Oncology

## 2021-01-02 ENCOUNTER — Inpatient Hospital Stay (HOSPITAL_BASED_OUTPATIENT_CLINIC_OR_DEPARTMENT_OTHER): Payer: Medicare HMO | Admitting: Hematology and Oncology

## 2021-01-02 ENCOUNTER — Other Ambulatory Visit: Payer: Self-pay

## 2021-01-02 VITALS — BP 127/69 | HR 88 | Temp 98.3°F | Resp 18 | Wt 206.5 lb

## 2021-01-02 DIAGNOSIS — Z5112 Encounter for antineoplastic immunotherapy: Secondary | ICD-10-CM | POA: Insufficient documentation

## 2021-01-02 DIAGNOSIS — Z95828 Presence of other vascular implants and grafts: Secondary | ICD-10-CM

## 2021-01-02 DIAGNOSIS — Z811 Family history of alcohol abuse and dependence: Secondary | ICD-10-CM | POA: Insufficient documentation

## 2021-01-02 DIAGNOSIS — Z814 Family history of other substance abuse and dependence: Secondary | ICD-10-CM | POA: Diagnosis not present

## 2021-01-02 DIAGNOSIS — D72829 Elevated white blood cell count, unspecified: Secondary | ICD-10-CM | POA: Diagnosis not present

## 2021-01-02 DIAGNOSIS — Z5111 Encounter for antineoplastic chemotherapy: Secondary | ICD-10-CM | POA: Diagnosis present

## 2021-01-02 DIAGNOSIS — Z5189 Encounter for other specified aftercare: Secondary | ICD-10-CM | POA: Diagnosis not present

## 2021-01-02 DIAGNOSIS — C3431 Malignant neoplasm of lower lobe, right bronchus or lung: Secondary | ICD-10-CM | POA: Diagnosis present

## 2021-01-02 DIAGNOSIS — E785 Hyperlipidemia, unspecified: Secondary | ICD-10-CM | POA: Diagnosis not present

## 2021-01-02 DIAGNOSIS — I2510 Atherosclerotic heart disease of native coronary artery without angina pectoris: Secondary | ICD-10-CM | POA: Insufficient documentation

## 2021-01-02 DIAGNOSIS — Z809 Family history of malignant neoplasm, unspecified: Secondary | ICD-10-CM | POA: Diagnosis not present

## 2021-01-02 DIAGNOSIS — Z820 Family history of epilepsy and other diseases of the nervous system: Secondary | ICD-10-CM | POA: Diagnosis not present

## 2021-01-02 DIAGNOSIS — I10 Essential (primary) hypertension: Secondary | ICD-10-CM | POA: Diagnosis not present

## 2021-01-02 DIAGNOSIS — C3490 Malignant neoplasm of unspecified part of unspecified bronchus or lung: Secondary | ICD-10-CM

## 2021-01-02 DIAGNOSIS — Z8249 Family history of ischemic heart disease and other diseases of the circulatory system: Secondary | ICD-10-CM | POA: Diagnosis not present

## 2021-01-02 DIAGNOSIS — E119 Type 2 diabetes mellitus without complications: Secondary | ICD-10-CM | POA: Diagnosis not present

## 2021-01-02 DIAGNOSIS — F17210 Nicotine dependence, cigarettes, uncomplicated: Secondary | ICD-10-CM | POA: Diagnosis not present

## 2021-01-02 DIAGNOSIS — Z79899 Other long term (current) drug therapy: Secondary | ICD-10-CM | POA: Diagnosis not present

## 2021-01-02 DIAGNOSIS — Z836 Family history of other diseases of the respiratory system: Secondary | ICD-10-CM | POA: Diagnosis not present

## 2021-01-02 DIAGNOSIS — R5383 Other fatigue: Secondary | ICD-10-CM | POA: Diagnosis not present

## 2021-01-02 DIAGNOSIS — Z823 Family history of stroke: Secondary | ICD-10-CM | POA: Diagnosis not present

## 2021-01-02 DIAGNOSIS — I700 Atherosclerosis of aorta: Secondary | ICD-10-CM | POA: Insufficient documentation

## 2021-01-02 DIAGNOSIS — J439 Emphysema, unspecified: Secondary | ICD-10-CM | POA: Diagnosis not present

## 2021-01-02 LAB — CBC WITH DIFFERENTIAL (CANCER CENTER ONLY)
Abs Immature Granulocytes: 0.1 10*3/uL — ABNORMAL HIGH (ref 0.00–0.07)
Basophils Absolute: 0.1 10*3/uL (ref 0.0–0.1)
Basophils Relative: 1 %
Eosinophils Absolute: 0.1 10*3/uL (ref 0.0–0.5)
Eosinophils Relative: 1 %
HCT: 32.3 % — ABNORMAL LOW (ref 36.0–46.0)
Hemoglobin: 10 g/dL — ABNORMAL LOW (ref 12.0–15.0)
Immature Granulocytes: 1 %
Lymphocytes Relative: 32 %
Lymphs Abs: 3.3 10*3/uL (ref 0.7–4.0)
MCH: 27 pg (ref 26.0–34.0)
MCHC: 31 g/dL (ref 30.0–36.0)
MCV: 87.1 fL (ref 80.0–100.0)
Monocytes Absolute: 1.1 10*3/uL — ABNORMAL HIGH (ref 0.1–1.0)
Monocytes Relative: 11 %
Neutro Abs: 5.6 10*3/uL (ref 1.7–7.7)
Neutrophils Relative %: 54 %
Platelet Count: 264 10*3/uL (ref 150–400)
RBC: 3.71 MIL/uL — ABNORMAL LOW (ref 3.87–5.11)
RDW: 23.6 % — ABNORMAL HIGH (ref 11.5–15.5)
WBC Count: 10.3 10*3/uL (ref 4.0–10.5)
nRBC: 0.3 % — ABNORMAL HIGH (ref 0.0–0.2)

## 2021-01-02 LAB — CMP (CANCER CENTER ONLY)
ALT: 17 U/L (ref 0–44)
AST: 22 U/L (ref 15–41)
Albumin: 3 g/dL — ABNORMAL LOW (ref 3.5–5.0)
Alkaline Phosphatase: 96 U/L (ref 38–126)
Anion gap: 12 (ref 5–15)
BUN: 9 mg/dL (ref 8–23)
CO2: 23 mmol/L (ref 22–32)
Calcium: 8.6 mg/dL — ABNORMAL LOW (ref 8.9–10.3)
Chloride: 106 mmol/L (ref 98–111)
Creatinine: 0.85 mg/dL (ref 0.44–1.00)
GFR, Estimated: >60 mL/min (ref >60)
Glucose, Bld: 89 mg/dL (ref 70–99)
Potassium: 3.7 mmol/L (ref 3.5–5.1)
Sodium: 141 mmol/L (ref 135–145)
Total Bilirubin: 0.4 mg/dL (ref 0.3–1.2)
Total Protein: 6.4 g/dL — ABNORMAL LOW (ref 6.5–8.1)

## 2021-01-02 LAB — TSH: TSH: 0.614 u[IU]/mL (ref 0.308–3.960)

## 2021-01-02 MED ORDER — SODIUM CHLORIDE 0.9 % IV SOLN
628.0000 mg | Freq: Once | INTRAVENOUS | Status: AC
  Administered 2021-01-02: 630 mg via INTRAVENOUS
  Filled 2021-01-02: qty 63

## 2021-01-02 MED ORDER — SODIUM CHLORIDE 0.9% FLUSH
10.0000 mL | Freq: Once | INTRAVENOUS | Status: AC
  Administered 2021-01-02: 10 mL

## 2021-01-02 MED ORDER — SODIUM CHLORIDE 0.9 % IV SOLN
100.0000 mg/m2 | Freq: Once | INTRAVENOUS | Status: AC
  Administered 2021-01-02: 200 mg via INTRAVENOUS
  Filled 2021-01-02: qty 10

## 2021-01-02 MED ORDER — SODIUM CHLORIDE 0.9% FLUSH
10.0000 mL | INTRAVENOUS | Status: DC | PRN
  Administered 2021-01-02: 10 mL

## 2021-01-02 MED ORDER — SODIUM CHLORIDE 0.9 % IV SOLN
10.0000 mg | Freq: Once | INTRAVENOUS | Status: AC
  Administered 2021-01-02: 10 mg via INTRAVENOUS
  Filled 2021-01-02: qty 10

## 2021-01-02 MED ORDER — SODIUM CHLORIDE 0.9 % IV SOLN
150.0000 mg | Freq: Once | INTRAVENOUS | Status: AC
  Administered 2021-01-02: 150 mg via INTRAVENOUS
  Filled 2021-01-02: qty 150

## 2021-01-02 MED ORDER — SODIUM CHLORIDE 0.9 % IV SOLN
Freq: Once | INTRAVENOUS | Status: AC
Start: 2021-01-02 — End: 2021-01-02

## 2021-01-02 MED ORDER — HEPARIN SOD (PORK) LOCK FLUSH 100 UNIT/ML IV SOLN
500.0000 [IU] | Freq: Once | INTRAVENOUS | Status: AC | PRN
  Administered 2021-01-02: 500 [IU]

## 2021-01-02 MED ORDER — ONDANSETRON HCL 8 MG PO TABS: 16.0000 mg | ORAL_TABLET | Freq: Two times a day (BID) | ORAL | 0 refills | Status: DC | PRN

## 2021-01-02 MED ORDER — SODIUM CHLORIDE 0.9 % IV SOLN
1200.0000 mg | Freq: Once | INTRAVENOUS | Status: AC
  Administered 2021-01-02: 1200 mg via INTRAVENOUS
  Filled 2021-01-02: qty 20

## 2021-01-02 MED ORDER — PALONOSETRON HCL INJECTION 0.25 MG/5ML
0.2500 mg | Freq: Once | INTRAVENOUS | Status: AC
  Administered 2021-01-02: 0.25 mg via INTRAVENOUS
  Filled 2021-01-02: qty 5

## 2021-01-02 NOTE — Progress Notes (Signed)
[place] [phone]   [phone]  PROGRESS NOTE  Patient Care Team: Allwardt, Randa Evens, PA-C as PCP - General (Physician Assistant) Milus Banister, MD as Attending Physician (Gastroenterology) Dr. Darylene Price, MD as Consulting Physician (Orthopedic Surgery) Latanya Maudlin, MD as Consulting Physician (Orthopedic Surgery) Kathrynn Ducking, MD as Consulting Physician (Neurology) Okey Regal, [place] as Consulting Physician (Optometry) [name], [facility] as Pharmacist (Pharmacist) Cameron Sprang, MD as Consulting Physician (Neurology)  Hematological/Oncological History # Small Cell Lung Cancer, Extensive Stage 07/05/2020: CT abdomen for lower abdominal pain. New pulmonary nodular density noted 07/06/2020: CT chest showed 2.2 cm macrolobulated right lower lobe pulmonary nodule (favored) versus pathologically enlarged infrahilar lymph node 10/13/2020: PET CT scan performed, findings show 2 cm right lower lobe lung mass is hypermetabolic and consistent with primary lung neoplasm. Additionally found hypermetabolic 17 mm soft tissue lesion between the descending duodenum and the pancreatic head  10/19/2020: EGD to biopsy hypermetabolic lymph node. Biopsy results show small cell lung cancer 10/26/2020: establish care with Dr. Lorenso Courier  11/13/2020: Cycle 1 Day 1 of Carbo/Etop/Atezolizumab 12/04/2020: Cycle 2 Day 1 of Carbo/Etop/Atezolizumab 11/22-11/25/2022: admitted for E. Coli bacteremia/sepsis. Start of Cycle 3 delayed. 01/02/2021: Cycle 3 Day 1 of Carbo/Etop/Atezolizumab  Interval History:  [name] 65 y.o. female with medical history significant for extensive stage small cell lung cancer who presents for a follow up visit. The patient's last visit was on 12/04/2020. In the interim since the last visit she has completed cycle 2 of chemotherapy, but unfortunately missed the start of cycle 3 due to being admitted to [facility] with E.  coli bacteremia/sepsis.  She presents today to start cycle 3 of chemotherapy.  On exam today [name] is seen in infusion.  Today she reports she is recovering well from her hospitalization and infection.  She reports that her main issue has been with nausea.  She notes that she has become nauseated on a regular basis has not been having any issues with vomiting.  She also denies any loose stools or diarrhea.  Her appetite has been "okay" but she has been suffering with fatigue as well.  She has lost a considerable amount of weight due to her prolonged hospitalization for E. coli septicemia.  She notes that she is finishing up her last doses of doxycycline and Keflex.  She has no signs or symptoms concerning for recurrent infection of her eye or urinary tract infection.  She currently denies any fevers, chills, sweats, vomiting or diarrhea.  A full 10 point ROS is listed below.   MEDICAL HISTORY:  Past Medical History:  Diagnosis Date   Allergic rhinitis    Anemia    Anxiety    Chicken pox    Chronic back pain    COPD (chronic obstructive pulmonary disease) (HCC)    Depression    DM (diabetes mellitus) ([place])    Essential hypertension    GERD (gastroesophageal reflux disease)    Headache    migraines   History of gastritis    EGD 2015   History of home oxygen therapy    2 liters at hs last 6 months   Hyperlipidemia    Hypothyroidism    Migraines    Osteoarthritis    oa   Scoliosis     SURGICAL HISTORY: Past Surgical History:  Procedure Laterality Date   APPENDECTOMY     1985   BIOPSY  07/24/2017   Procedure: BIOPSY;  Surgeon:  Milus Banister, MD;  Location: [facility];  Service: Endoscopy;;   CARDIAC CATHETERIZATION N/A 10/31/2015   Procedure: Left Heart Cath and Coronary Angiography;  Surgeon: Leonie Man, MD;  Location: [facility];  Service: Cardiovascular;  Laterality: N/A;   CARPAL TUNNEL RELEASE Left    CARPAL TUNNEL RELEASE Right    CHOLECYSTECTOMY   late 1980's   COLONOSCOPY WITH PROPOFOL N/A 07/24/2017   Procedure: COLONOSCOPY WITH PROPOFOL;  Surgeon: Milus Banister, MD;  Location: [facility];  Service: Endoscopy;  Laterality: N/A;   ESOPHAGOGASTRODUODENOSCOPY N/A 07/24/2017   Procedure: ESOPHAGOGASTRODUODENOSCOPY (EGD);  Surgeon: Milus Banister, MD;  Location: [facility];  Service: Endoscopy;  Laterality: N/A;   ESOPHAGOGASTRODUODENOSCOPY (EGD) WITH PROPOFOL N/A 10/19/2020   Procedure: ESOPHAGOGASTRODUODENOSCOPY (EGD) WITH PROPOFOL;  Surgeon: Milus Banister, MD;  Location: [facility];  Service: Endoscopy;  Laterality: N/A;   FINE NEEDLE ASPIRATION N/A 10/19/2020   Procedure: FINE NEEDLE ASPIRATION (FNA) LINEAR;  Surgeon: Milus Banister, MD;  Location: [facility];  Service: Endoscopy;  Laterality: N/A;   GALLBLADDER SURGERY  1991   HIP CLOSED REDUCTION Right 01/08/2016   Procedure: CLOSED MANIPULATION HIP;  Surgeon: Susa Day, MD;  Location: [facility];  Service: Orthopedics;  Laterality: Right;   HIP CLOSED REDUCTION Right 01/19/2016   Procedure: ATTEMPTED CLOSED REDUCTION RIGHT HIP;  Surgeon: Wylene Simmer, MD;  Location: [facility];  Service: Orthopedics;  Laterality: Right;   HIP CLOSED REDUCTION Right 01/20/2016   Procedure: CLOSED REDUCTION RIGHT TOTAL HIP;  Surgeon: Paralee Cancel, MD;  Location: [facility];  Service: Orthopedics;  Laterality: Right;   HIP CLOSED REDUCTION Right 02/17/2016   Procedure: CLOSED REDUCTION RIGHT TOTAL HIP;  Surgeon: Rod Can, MD;  Location: [place];  Service: Orthopedics;  Laterality: Right;   HIP CLOSED REDUCTION Right 02/28/2016   Procedure: CLOSED REDUCTION HIP;  Surgeon: Nicholes Stairs, MD;  Location: [facility];  Service: Orthopedics;  Laterality: Right;   IR IMAGING GUIDED PORT INSERTION  11/01/2020   POLYPECTOMY  07/24/2017   Procedure: POLYPECTOMY;  Surgeon: Milus Banister, MD;  Location: [facility];  Service: Endoscopy;;   [place], with  1 ovary removed and 2 nd ovary removed 2003   TOTAL HIP ARTHROPLASTY Right    Original surgery 2006 with revision 2010   TOTAL HIP REVISION Right 01/01/2016   Procedure: TOTAL HIP REVISION;  Surgeon: Paralee Cancel, MD;  Location: [facility];  Service: Orthopedics;  Laterality: Right;   TOTAL HIP REVISION Right 03/02/2016   Procedure: TOTAL HIP REVISION;  Surgeon: Paralee Cancel, MD;  Location: [facility];  Service: Orthopedics;  Laterality: Right;   TOTAL HIP REVISION Right 09/02/2016   Procedure: Right hip constrained liner- posterior;  Surgeon: Paralee Cancel, MD;  Location: [facility];  Service: Orthopedics;  Laterality: Right;   ULNAR NERVE TRANSPOSITION Right    UPPER ESOPHAGEAL ENDOSCOPIC ULTRASOUND (EUS) N/A 10/19/2020   Procedure: UPPER ESOPHAGEAL ENDOSCOPIC ULTRASOUND (EUS);  Surgeon: Milus Banister, MD;  Location: [facility];  Service: Endoscopy;  Laterality: N/A;  periduodenal lesion    SOCIAL HISTORY: Social History   Socioeconomic History   Marital status: Married    Spouse name: Not on file   Number of children: 2   Years of education: Not on file   Highest education level: Not on file  Occupational History   Occupation: disabled   Occupation: disabled  Tobacco Use   Smoking status:  Every Day    Packs/day: 2.00    Years: 46.00    Pack years: 92.00    Types: Cigarettes   Smokeless tobacco: Never   Tobacco comments:    Currently smoking 1.5ppd as of 11/10/20  Vaping Use   Vaping Use: Never used  Substance and Sexual Activity   Alcohol use: No   Drug use: No   Sexual activity: Not Currently    Partners: Male  Other Topics Concern   Not on file  Social History Narrative   Right handed    Caffeine~ 2 cups per day    Lives at home with husband (strained relationship)   Primary caretaker for disabled brother who had aneurism   Daughter died July 02, 2018    Social Determinants of Radio broadcast assistant Strain: Not on file  Food Insecurity: Not on file  Transportation  Needs: Not on file  Physical Activity: Not on file  Stress: Not on file  Social Connections: Not on file  Intimate Partner Violence: Not on file    FAMILY HISTORY: Family History  Problem Relation Age of Onset   COPD Mother    Heart disease Mother    Lung disease Father        Asbestosis   Heart attack Father    Heart disease Father    Cerebral aneurysm Brother    Aneurysm Brother        Brain   Drug abuse Daughter    Epilepsy Son    Alcohol abuse Son    Drug abuse Son    Arthritis Maternal Grandmother    Heart disease Maternal Grandmother    Asthma Maternal Grandfather    Cancer Maternal Grandfather    Arthritis Paternal Grandmother    Heart disease Paternal Grandmother    Stroke Paternal Grandmother    Early death Paternal Grandfather    Heart disease Paternal Grandfather     ALLERGIES:  is allergic to metformin and related, nsaids, aleve [naproxen sodium], codeine, penicillins, and sulfonamide derivatives.  MEDICATIONS:  Current Outpatient Medications  Medication Sig Dispense Refill   ondansetron (ZOFRAN) 8 MG tablet Take 2 tablets (16 mg total) by mouth 2 (two) times daily as needed. 60 tablet 0   albuterol (PROAIR HFA) 108 (90 Base) MCG/ACT inhaler 2 puffs every 4 hours as needed only  if your can't catch your breath 18 g 3   ALPRAZolam (XANAX) 1 MG tablet Take 1 tablet po TID as needed for anxiety. 90 tablet 2   aspirin EC 81 MG tablet Take 1 tablet (81 mg total) by mouth daily. Swallow whole. 90 tablet 3   atorvastatin (LIPITOR) 20 MG tablet Take 1 tablet (20 mg total) by mouth at bedtime. 90 tablet 1   cyclobenzaprine (FLEXERIL) 10 MG tablet Take 1 tablet (10 mg total) by mouth 3 (three) times daily as needed for muscle spasms. 90 tablet 5   DULoxetine (CYMBALTA) 60 MG capsule Take 2 capsules (120 mg total) by mouth daily. (Patient taking differently: Take 120 mg by mouth at bedtime.) 60 capsule 5   fluticasone (FLONASE) 50 MCG/ACT nasal spray Place 2 sprays into  both nostrils daily. 16 g 6   Fluticasone-Umeclidin-Vilant (TRELEGY ELLIPTA) 200-62.5-25 MCG/INH AEPB Inhale 1 puff into the lungs daily. 60 each 5   furosemide (LASIX) 20 MG tablet TAKE 1 TABLET BY MOUTH EVERY DAY AS NEEDED 30 tablet 90   glucose blood test strip Use as instructed 300 each 11   HYDROcodone-acetaminophen (NORCO) 10-325 MG  tablet TAKE 1 TABLET BY MOUTH EVERY 6 HOURS AS NEEDED 120 tablet 0   levothyroxine (SYNTHROID) 125 MCG tablet Take 1 tablet (125 mcg total) by mouth daily before breakfast. 30 tablet 0   Lidocaine 4 % PTCH Place 1 patch onto the skin daily as needed (pain.).     loratadine (CLARITIN) 10 MG tablet Take 10 mg by mouth daily.     magnesium oxide (MAG-OX) 400 (240 Mg) MG tablet Take 1 tablet (400 mg total) by mouth daily for 14 days. (Patient not taking: Reported on 12/25/2020) 14 tablet 0   omeprazole (PRILOSEC) 40 MG capsule TAKE ONE CAPSULE BY MOUTH TWICE DAILY 60 capsule 2   OXYGEN Inhale 3 L into the lungs at bedtime. Uses At Night     potassium chloride (KLOR-CON) 10 MEQ tablet Take 1 tablet (10 mEq total) by mouth at bedtime. 90 tablet 1   prochlorperazine (COMPAZINE) 10 MG tablet Take 1 tablet (10 mg total) by mouth every 6 (six) hours as needed for nausea or vomiting. 30 tablet 0   triamterene-hydrochlorothiazide (MAXZIDE-25) 37.5-25 MG tablet Take 1 tablet by mouth daily. 90 tablet 1   No current facility-administered medications for this visit.   Facility-Administered Medications Ordered in Other Visits  Medication Dose Route Frequency Provider Last Rate Last Admin   atezolizumab (TECENTRIQ) 1,200 mg in sodium chloride 0.9 % 250 mL chemo infusion  1,200 mg Intravenous Once Ledell Peoples IV, MD 540 mL/hr at 01/02/21 1006 1,200 mg at 01/02/21 1006   CARBOplatin (PARAPLATIN) 630 mg in sodium chloride 0.9 % 250 mL chemo infusion  630 mg Intravenous Once Orson Slick, MD       etoposide (VEPESID) 200 mg in sodium chloride 0.9 % 500 mL chemo infusion   100 mg/m2 (Treatment Plan Recorded) Intravenous Once Orson Slick, MD       heparin lock flush 100 unit/mL  500 Units Intracatheter Once PRN Orson Slick, MD       sodium chloride flush (NS) 0.9 % injection 10 mL  10 mL Intracatheter PRN Orson Slick, MD        REVIEW OF SYSTEMS:   Constitutional: ( - ) fevers, ( - )  chills , ( - ) night sweats Eyes: ( - ) blurriness of vision, ( - ) double vision, ( - ) watery eyes Ears, nose, mouth, throat, and face: ( - ) mucositis, ( - ) sore throat Respiratory: ( - ) cough, ( - ) dyspnea, ( - ) wheezes Cardiovascular: ( - ) palpitation, ( - ) chest discomfort, ( - ) lower extremity swelling Gastrointestinal:  ( - ) nausea, ( - ) heartburn, ( - ) change in bowel habits Skin: ( - ) abnormal skin rashes Lymphatics: ( - ) new lymphadenopathy, ( - ) easy bruising Neurological: ( - ) numbness, ( - ) tingling, ( - ) new weaknesses Behavioral/Psych: ( - ) mood change, ( - ) new changes  All other systems were reviewed with the patient and are negative.  PHYSICAL EXAMINATION: ECOG PERFORMANCE STATUS: 1 - Symptomatic but completely ambulatory  There were no vitals filed for this visit.  There were no vitals filed for this visit.   GENERAL: Well-appearing middle-age Caucasian female, alert, no distress and comfortable SKIN: skin color, texture, turgor are normal, no rashes or significant lesions EYES: conjunctiva are pink and non-injected, sclera clear LUNGS: clear to auscultation and percussion with normal breathing effort HEART: regular rate &  rhythm and no murmurs and no lower extremity edema Musculoskeletal: no cyanosis of digits and no clubbing  PSYCH: alert & oriented x 3, fluent speech NEURO: no focal motor/sensory deficits  LABORATORY DATA:  I have reviewed the data as listed CBC Latest Ref Rng & Units 01/02/2021 12/21/2020 12/20/2020  WBC 4.0 - 10.5 K/uL 10.3 12.6(H) 12.8(H)  Hemoglobin 12.0 - 15.0 g/dL 10.0(L) 9.1(L) 9.3(L)   Hematocrit 36.0 - 46.0 % 32.3(L) 27.6(L) 27.5(L)  Platelets 150 - 400 K/uL 264 36(L) 20(LL)    CMP Latest Ref Rng & Units 01/02/2021 12/22/2020 12/21/2020  Glucose 70 - 99 mg/dL 89 113(H) 94  BUN 8 - 23 mg/dL 9 8 8   Creatinine 0.44 - 1.00 mg/dL 0.85 0.78 0.79  Sodium 135 - 145 mmol/L 141 135 133(L)  Potassium 3.5 - 5.1 mmol/L 3.7 5.0 4.4  Chloride 98 - 111 mmol/L 106 103 103  CO2 22 - 32 mmol/L 23 24 25   Calcium 8.9 - 10.3 mg/dL 8.6(L) 8.4(L) 7.6(L)  Total Protein 6.5 - 8.1 g/dL 6.4(L) 5.5(L) 5.0(L)  Total Bilirubin 0.3 - 1.2 mg/dL 0.4 0.5 0.8  Alkaline Phos 38 - 126 U/L 96 125 116  AST 15 - 41 U/L 22 27 27   ALT 0 - 44 U/L 17 25 23     No results found for: MPROTEIN Lab Results  Component Value Date   KPAFRELGTCHN 0.75 06/30/2014   LAMBDASER 3.78 (H) 06/30/2014   KAPLAMBRATIO 0.20 (L) 06/30/2014     RADIOGRAPHIC STUDIES: DG Chest 2 View  Result Date: 12/18/2020 CLINICAL DATA:  Shortness of breath with weakness.  Fever. EXAM: CHEST - 2 VIEW COMPARISON:  03/05/2019 FINDINGS: Right-sided chest port in place with distal tip terminating at the level of the distal SVC. The heart size and mediastinal contours are within normal limits. Atherosclerotic calcification of the aortic knob. No focal airspace consolidation, pleural effusion, or pneumothorax. The visualized skeletal structures are unremarkable. IMPRESSION: No active cardiopulmonary disease. Electronically Signed   By: Davina Poke D.O.   On: 12/18/2020 15:33   CT Angio Chest PE W and/or Wo Contrast  Result Date: 12/19/2020 CLINICAL DATA:  Current chemotherapy for lung cancer. Shortness of breath began 3 days ago. EXAM: CT ANGIOGRAPHY CHEST WITH CONTRAST TECHNIQUE: Multidetector CT imaging of the chest was performed using the standard protocol during bolus administration of intravenous contrast. Multiplanar CT image reconstructions and MIPs were obtained to evaluate the vascular anatomy. CONTRAST:  74mL OMNIPAQUE IOHEXOL 350  MG/ML SOLN COMPARISON:  PET-CT 10/13/2020 and CTA chest from 07/26/2020 FINDINGS: Cardiovascular: Delayed but adequate bolus for pulmonary embolus assessment. No filling defect is identified in the pulmonary arterial tree to suggest pulmonary embolus. Coronary, aortic arch, and branch vessel atherosclerotic vascular disease. Borderline cardiomegaly. Mediastinum/Nodes: No pathologic adenopathy identified. Lungs/Pleura: Emphysema noted. There is some new ground-glass opacities in the upper lobes, 1 of the most conspicuous a 2.3 by 1.8 cm ground-glass density in the left upper lobe on image 54 series 6. These are nonspecific but may reflect alveolitis or atypical pneumonia. Substantially reduced right lower lobe peribronchovascular nodularity, the area of nodularity currently measures about 1.2 by 0.7 cm on image 83 series 6, previously 2.4 by 1.5 cm on 07/26/2020. Upper Abdomen: We do not include the level of the prior pancreaticoduodenal groove lesion. Musculoskeletal: Mild lower thoracic spondylosis. Review of the MIP images confirms the above findings. IMPRESSION: 1. No filling defect is identified in the pulmonary arterial tree to suggest pulmonary embolus. 2. There is some patchy ground-glass opacities  in the upper lobes suggesting alveolitis or atypical pneumonia. COVID pneumonia can sometimes have a similar appearance, and testing may be appropriate. 3. Aortic Atherosclerosis (ICD10-I70.0) and Emphysema (ICD10-J43.9). Coronary atherosclerosis. Borderline cardiomegaly. 4. The right lower lobe nodule is substantially reduced in size compared to 10/13/2020 and 07/26/2020, indicating response to therapy. Electronically Signed   By: Van Clines M.D.   On: 12/19/2020 17:03   US RENAL  Result Date: 12/20/2020 CLINICAL DATA:  UTI EXAM: RENAL / URINARY TRACT ULTRASOUND COMPLETE COMPARISON:  None. FINDINGS: Right Kidney: Renal measurements: 13.3 x 5.5 x 6.7 cm = volume: 259 mL. Echogenicity within normal  limits. No mass or hydronephrosis visualized. Left Kidney: Renal measurements: 12.9 x 6.5 x 8.1 cm = volume: 356 mL. Echogenicity within normal limits. No mass or hydronephrosis visualized. Bladder: Appears normal for degree of bladder distention. Other: None. IMPRESSION: Unremarkable examination. Electronically Signed   By: Ofilia Neas M.D.   On: 12/20/2020 10:31   DG Chest Port 1 View  Result Date: 12/19/2020 CLINICAL DATA:  Sepsis. EXAM: PORTABLE CHEST 1 VIEW COMPARISON:  12/18/2020 FINDINGS: The cardiac silhouette, mediastinal and hilar contours are normal. The lungs are clear. No pleural effusions. No pulmonary lesions. The right IJ power port is stable. The bony structures are intact. IMPRESSION: No acute cardiopulmonary findings. Electronically Signed   By: Marijo Sanes M.D.   On: 12/19/2020 15:45    ASSESSMENT & PLAN [name] 65 y.o. female with medical history significant for extensive stage small cell lung cancer who presents for a follow up visit.   After review of the labs, review of the records, and discussion with the patient the patients findings are most consistent with extensive stage small cell lung cancer with metastasis from the right lower lobe to the lymph nodes of the abdomen.  At this time we will pursue triple therapy with carboplatin, etoposide, and atezolizumab.  After 4 cycles we will convert to maintenance atezolizumab alone.  We previously discussed the risks and benefits of this therapy and the patient was in agreement to proceed with this treatment.  The treatment of choice consist of carboplatin, etoposide, and atezolizumab.  The regimen consists of carboplatin AUC of 5 IV on day 1, etoposide 100 mg per metered squared IV on day 1, 2, and 3 and atezolizumab 1200 mg on day 1.  This continues for 21-day cycles.  After 4 cycles the patient proceeds with atezolizumab maintenance therapy alone.   #Small Cell Lung Cancer, Extensive Stage -- MRI of the brain  shows no evidence of intracranial spread --Findings are currently consistent with metastatic small cell lung cancer with metastatic spread to the lymph nodes of the abdomen --Prognosis is poor with anticipated life span of less than 12 months -- Patient has extensive stage due to metastatic spread to lymph nodes in the abdomen --Plan to proceed with carboplatin, etoposide, and atezolizumab --will consider prophylactic cranial radiation after start of chemotherapy.  Plan: --today is Cycle 3 Day 1 of Carbo/Etop/Atezolizumab --CT scan q 3 months. Currently due this month.  --GCSF shot on Day 3 --RTC in 3 weeks for next cycle of treatment.   #Leukocytosis #Left Upper Eyelid Infection --baseline around 11, increased to 15.7 today --potentially secondary to eye lid infection, under the management of ophthalmology.  --continue to monitor    #Supportive Care -- chemotherapy education complete -- port placed -- zofran 8mg  q8H PRN and compazine 10mg  PO q6H for nausea -- EMLA cream for port -- no pain medication  required at this time.   Orders Placed This Encounter  Procedures   CT CHEST ABDOMEN PELVIS W CONTRAST    Standing Status:   Future    Standing Expiration Date:   01/02/2022    Order Specific Question:   Preferred imaging location?    Answer:   [facility]    Order Specific Question:   Is Oral Contrast requested for this exam?    Answer:   Yes, Per Radiology protocol    Order Specific Question:   Reason for Exam (SYMPTOM  OR DIAGNOSIS REQUIRED)    Answer:   Small cell lung cancer, assess response to chemo     All questions were answered. The patient knows to call the clinic with any problems, questions or concerns.  A total of more than 30 minutes were spent on this encounter with face-to-face time and non-face-to-face time, including preparing to see the patient, ordering tests and/or medications, counseling the patient and coordination of care as outlined above.    Ledell Peoples, MD Department of Hematology/Oncology [place] [facility] Phone: [phone] Pager: [phone] Email: [email or web address]  01/02/2021 10:18 AM

## 2021-01-02 NOTE — Patient Instructions (Signed)
[facility]COL[facility]  Discharge Instructions: Thank you for choosing [facility] to provide your oncology and hematology care.   If you have a lab appointment with the [facility], please go directly to the [facility] and check in at the registration area.   Wear comfortable clothing and clothing appropriate for easy access to any Portacath or PICC line.   We strive to give you quality time with your provider. You may need to reschedule your appointment if you arrive late (15 or more minutes).  Arriving late affects you and other patients whose appointments are after yours.  Also, if you miss three or more appointments without notifying the office, you may be dismissed from the clinic at the provider's discretion.      For prescription refill requests, have your pharmacy contact our office and allow 72 hours for refills to be completed.    Today you received the following chemotherapy and/or immunotherapy agents Tecentriq, Carboplatin, and etoposide      To help prevent nausea and vomiting after your treatment, we encourage you to take your nausea medication as directed.  BELOW ARE SYMPTOMS THAT SHOULD BE REPORTED IMMEDIATELY: *FEVER GREATER THAN 100.4 F (38 C) OR HIGHER *CHILLS OR SWEATING *NAUSEA AND VOMITING THAT IS NOT CONTROLLED WITH YOUR NAUSEA MEDICATION *UNUSUAL SHORTNESS OF BREATH *UNUSUAL BRUISING OR BLEEDING *URINARY PROBLEMS (pain or burning when urinating, or frequent urination) *BOWEL PROBLEMS (unusual diarrhea, constipation, pain near the anus) TENDERNESS IN MOUTH AND THROAT WITH OR WITHOUT PRESENCE OF ULCERS (sore throat, sores in mouth, or a toothache) UNUSUAL RASH, SWELLING OR PAIN  UNUSUAL VAGINAL DISCHARGE OR ITCHING   Items with * indicate a potential emergency and should be followed up as soon as possible or go to the Emergency Department if any problems should occur.  Please show the CHEMOTHERAPY ALERT CARD or IMMUNOTHERAPY  ALERT CARD at check-in to the Emergency Department and triage nurse.  Should you have questions after your visit or need to cancel or reschedule your appointment, please contact [facility]  Dept: [phone]  and follow the prompts.  Office hours are 8:00 a.m. to 4:30 p.m. Monday - Friday. Please note that voicemails left after 4:00 p.m. may not be returned until the following business day.  We are closed weekends and major holidays. You have access to a nurse at all times for urgent questions. Please call the main number to the clinic Dept: [phone] and follow the prompts.   For any non-urgent questions, you may also contact your provider using MyChart. We now offer e-Visits for anyone 32 and older to request care online for non-urgent symptoms. For details visit [email or web address].   Also download the MyChart app! Go to the app store, search "MyChart", open the app, select [facility], and log in with your MyChart username and password.  Due to Covid, a mask is required upon entering the hospital/clinic. If you do not have a mask, one will be given to you upon arrival. For doctor visits, patients may have 1 support person aged 64 or older with them. For treatment visits, patients cannot have anyone with them due to current Covid guidelines and our immunocompromised population.

## 2021-01-03 ENCOUNTER — Inpatient Hospital Stay: Payer: Medicare HMO

## 2021-01-03 ENCOUNTER — Ambulatory Visit: Payer: Medicare HMO

## 2021-01-03 ENCOUNTER — Encounter: Payer: Self-pay | Admitting: *Deleted

## 2021-01-03 ENCOUNTER — Other Ambulatory Visit: Payer: Self-pay | Admitting: Physical Medicine and Rehabilitation

## 2021-01-03 VITALS — BP 119/69 | HR 94 | Temp 97.9°F | Resp 18

## 2021-01-03 DIAGNOSIS — E119 Type 2 diabetes mellitus without complications: Secondary | ICD-10-CM | POA: Diagnosis not present

## 2021-01-03 DIAGNOSIS — Z814 Family history of other substance abuse and dependence: Secondary | ICD-10-CM | POA: Diagnosis not present

## 2021-01-03 DIAGNOSIS — G894 Chronic pain syndrome: Secondary | ICD-10-CM

## 2021-01-03 DIAGNOSIS — R5383 Other fatigue: Secondary | ICD-10-CM | POA: Diagnosis not present

## 2021-01-03 DIAGNOSIS — Z836 Family history of other diseases of the respiratory system: Secondary | ICD-10-CM | POA: Diagnosis not present

## 2021-01-03 DIAGNOSIS — J439 Emphysema, unspecified: Secondary | ICD-10-CM | POA: Diagnosis not present

## 2021-01-03 DIAGNOSIS — C3490 Malignant neoplasm of unspecified part of unspecified bronchus or lung: Secondary | ICD-10-CM

## 2021-01-03 DIAGNOSIS — I10 Essential (primary) hypertension: Secondary | ICD-10-CM | POA: Diagnosis not present

## 2021-01-03 DIAGNOSIS — F17210 Nicotine dependence, cigarettes, uncomplicated: Secondary | ICD-10-CM | POA: Diagnosis not present

## 2021-01-03 DIAGNOSIS — D72829 Elevated white blood cell count, unspecified: Secondary | ICD-10-CM | POA: Diagnosis not present

## 2021-01-03 DIAGNOSIS — E785 Hyperlipidemia, unspecified: Secondary | ICD-10-CM | POA: Diagnosis not present

## 2021-01-03 DIAGNOSIS — Z809 Family history of malignant neoplasm, unspecified: Secondary | ICD-10-CM | POA: Diagnosis not present

## 2021-01-03 DIAGNOSIS — Z820 Family history of epilepsy and other diseases of the nervous system: Secondary | ICD-10-CM | POA: Diagnosis not present

## 2021-01-03 DIAGNOSIS — Z823 Family history of stroke: Secondary | ICD-10-CM | POA: Diagnosis not present

## 2021-01-03 DIAGNOSIS — Z5112 Encounter for antineoplastic immunotherapy: Secondary | ICD-10-CM | POA: Diagnosis not present

## 2021-01-03 DIAGNOSIS — Z79899 Other long term (current) drug therapy: Secondary | ICD-10-CM | POA: Diagnosis not present

## 2021-01-03 DIAGNOSIS — C3431 Malignant neoplasm of lower lobe, right bronchus or lung: Secondary | ICD-10-CM | POA: Diagnosis not present

## 2021-01-03 DIAGNOSIS — Z811 Family history of alcohol abuse and dependence: Secondary | ICD-10-CM | POA: Diagnosis not present

## 2021-01-03 DIAGNOSIS — I700 Atherosclerosis of aorta: Secondary | ICD-10-CM | POA: Diagnosis not present

## 2021-01-03 DIAGNOSIS — Z5189 Encounter for other specified aftercare: Secondary | ICD-10-CM | POA: Diagnosis not present

## 2021-01-03 DIAGNOSIS — I2510 Atherosclerotic heart disease of native coronary artery without angina pectoris: Secondary | ICD-10-CM | POA: Diagnosis not present

## 2021-01-03 DIAGNOSIS — Z8249 Family history of ischemic heart disease and other diseases of the circulatory system: Secondary | ICD-10-CM | POA: Diagnosis not present

## 2021-01-03 DIAGNOSIS — Z5111 Encounter for antineoplastic chemotherapy: Secondary | ICD-10-CM | POA: Diagnosis not present

## 2021-01-03 MED ORDER — SODIUM CHLORIDE 0.9 % IV SOLN
Freq: Once | INTRAVENOUS | Status: AC
Start: 2021-01-03 — End: 2021-01-03

## 2021-01-03 MED ORDER — SODIUM CHLORIDE 0.9 % IV SOLN
10.0000 mg | Freq: Once | INTRAVENOUS | Status: AC
  Administered 2021-01-03: 10 mg via INTRAVENOUS
  Filled 2021-01-03: qty 10

## 2021-01-03 MED ORDER — SODIUM CHLORIDE 0.9 % IV SOLN
100.0000 mg/m2 | Freq: Once | INTRAVENOUS | Status: AC
  Administered 2021-01-03: 200 mg via INTRAVENOUS
  Filled 2021-01-03: qty 10

## 2021-01-03 MED ORDER — HEPARIN SOD (PORK) LOCK FLUSH 100 UNIT/ML IV SOLN
500.0000 [IU] | Freq: Once | INTRAVENOUS | Status: AC | PRN
  Administered 2021-01-03: 500 [IU]

## 2021-01-03 MED ORDER — SODIUM CHLORIDE 0.9% FLUSH
10.0000 mL | INTRAVENOUS | Status: DC | PRN
  Administered 2021-01-03: 10 mL

## 2021-01-03 MED FILL — Dexamethasone Sodium Phosphate Inj 100 MG/10ML: INTRAMUSCULAR | Qty: 1 | Status: AC

## 2021-01-03 NOTE — Progress Notes (Signed)
Oncology Nurse Navigator Documentation  Oncology Nurse Navigator Flowsheets 01/03/2021 10/26/2020  Abnormal Finding Date - 07/05/2020  Confirmed Diagnosis Date - 10/19/2020  Diagnosis Status - Confirmed Diagnosis Complete  Planned Course of Treatment - Chemotherapy  Phase of Treatment Chemo Chemo  Chemotherapy Actual Start Date: 11/13/2020 -  Navigator Follow Up Date: 01/23/2021 10/30/2020  Navigator Follow Up Reason: Chemotherapy Appointment Review  Navigator Location [facility]  Referral Date to RadOnc/MedOnc - 10/20/2020  Navigator Encounter Type Treatment Clinic/MDC;Initial MedOnc  Treatment Initiated Date 11/13/2020 -  Patient Visit Type Other Initial;MedOnc  Treatment Phase Treatment Pre-Tx/Tx Discussion  Barriers/Navigation Needs Education/I spoke to [name] today during her infusion.  I tried to talk to her about smoking cessation but she is not wanting to discuss. She states her PCP says it is ok for her to smoke.  I listened as she explained. No other barriers identified.  Education  Education Smoking cessation Newly Diagnosed Cancer Education;Smoking cessation;Other  Interventions Psycho-Social Support;Education Education;Psycho-Social Support  Acuity Level 3-Moderate Needs (3-4 Barriers Identified) Level 2-Minimal Needs (1-2 Barriers Identified)  Education Method Verbal Written;Verbal  Support Groups/Services - Other  Time Spent with Patient 30 30

## 2021-01-03 NOTE — Patient Instructions (Signed)
[facility]  Discharge Instructions: Thank you for choosing [facility] to provide your oncology and hematology care.   If you have a lab appointment with the [facility], please go directly to the [facility] and check in at the registration area.   Wear comfortable clothing and clothing appropriate for easy access to any Portacath or PICC line.   We strive to give you quality time with your provider. You may need to reschedule your appointment if you arrive late (15 or more minutes).  Arriving late affects you and other patients whose appointments are after yours.  Also, if you miss three or more appointments without notifying the office, you may be dismissed from the clinic at the provider's discretion.      For prescription refill requests, have your pharmacy contact our office and allow 72 hours for refills to be completed.    Today you received the following chemotherapy and/or immunotherapy agents: Etoposide     To help prevent nausea and vomiting after your treatment, we encourage you to take your nausea medication as directed.  BELOW ARE SYMPTOMS THAT SHOULD BE REPORTED IMMEDIATELY: *FEVER GREATER THAN 100.4 F (38 C) OR HIGHER *CHILLS OR SWEATING *NAUSEA AND VOMITING THAT IS NOT CONTROLLED WITH YOUR NAUSEA MEDICATION *UNUSUAL SHORTNESS OF BREATH *UNUSUAL BRUISING OR BLEEDING *URINARY PROBLEMS (pain or burning when urinating, or frequent urination) *BOWEL PROBLEMS (unusual diarrhea, constipation, pain near the anus) TENDERNESS IN MOUTH AND THROAT WITH OR WITHOUT PRESENCE OF ULCERS (sore throat, sores in mouth, or a toothache) UNUSUAL RASH, SWELLING OR PAIN  UNUSUAL VAGINAL DISCHARGE OR ITCHING   Items with * indicate a potential emergency and should be followed up as soon as possible or go to the Emergency Department if any problems should occur.  Please show the CHEMOTHERAPY ALERT CARD or IMMUNOTHERAPY ALERT CARD at check-in to  the Emergency Department and triage nurse.  Should you have questions after your visit or need to cancel or reschedule your appointment, please contact [facility]  Dept: [phone]  and follow the prompts.  Office hours are 8:00 a.m. to 4:30 p.m. Monday - Friday. Please note that voicemails left after 4:00 p.m. may not be returned until the following business day.  We are closed weekends and major holidays. You have access to a nurse at all times for urgent questions. Please call the main number to the clinic Dept: [phone] and follow the prompts.   For any non-urgent questions, you may also contact your provider using MyChart. We now offer e-Visits for anyone 14 and older to request care online for non-urgent symptoms. For details visit [email or web address].   Also download the MyChart app! Go to the app store, search "MyChart", open the app, select [facility], and log in with your MyChart username and password.  Due to Covid, a mask is required upon entering the hospital/clinic. If you do not have a mask, one will be given to you upon arrival. For doctor visits, patients may have 1 support person aged 32 or older with them. For treatment visits, patients cannot have anyone with them due to current Covid guidelines and our immunocompromised population.

## 2021-01-04 ENCOUNTER — Other Ambulatory Visit: Payer: Self-pay

## 2021-01-04 ENCOUNTER — Inpatient Hospital Stay: Payer: Medicare HMO

## 2021-01-04 VITALS — BP 173/89 | HR 87 | Temp 98.4°F | Resp 16

## 2021-01-04 DIAGNOSIS — D72829 Elevated white blood cell count, unspecified: Secondary | ICD-10-CM | POA: Diagnosis not present

## 2021-01-04 DIAGNOSIS — F17210 Nicotine dependence, cigarettes, uncomplicated: Secondary | ICD-10-CM | POA: Diagnosis not present

## 2021-01-04 DIAGNOSIS — Z5112 Encounter for antineoplastic immunotherapy: Secondary | ICD-10-CM | POA: Diagnosis not present

## 2021-01-04 DIAGNOSIS — Z79899 Other long term (current) drug therapy: Secondary | ICD-10-CM | POA: Diagnosis not present

## 2021-01-04 DIAGNOSIS — I2510 Atherosclerotic heart disease of native coronary artery without angina pectoris: Secondary | ICD-10-CM | POA: Diagnosis not present

## 2021-01-04 DIAGNOSIS — Z5189 Encounter for other specified aftercare: Secondary | ICD-10-CM | POA: Diagnosis not present

## 2021-01-04 DIAGNOSIS — I700 Atherosclerosis of aorta: Secondary | ICD-10-CM | POA: Diagnosis not present

## 2021-01-04 DIAGNOSIS — E119 Type 2 diabetes mellitus without complications: Secondary | ICD-10-CM | POA: Diagnosis not present

## 2021-01-04 DIAGNOSIS — C3490 Malignant neoplasm of unspecified part of unspecified bronchus or lung: Secondary | ICD-10-CM

## 2021-01-04 DIAGNOSIS — Z836 Family history of other diseases of the respiratory system: Secondary | ICD-10-CM | POA: Diagnosis not present

## 2021-01-04 DIAGNOSIS — Z823 Family history of stroke: Secondary | ICD-10-CM | POA: Diagnosis not present

## 2021-01-04 DIAGNOSIS — Z820 Family history of epilepsy and other diseases of the nervous system: Secondary | ICD-10-CM | POA: Diagnosis not present

## 2021-01-04 DIAGNOSIS — Z814 Family history of other substance abuse and dependence: Secondary | ICD-10-CM | POA: Diagnosis not present

## 2021-01-04 DIAGNOSIS — R5383 Other fatigue: Secondary | ICD-10-CM | POA: Diagnosis not present

## 2021-01-04 DIAGNOSIS — Z811 Family history of alcohol abuse and dependence: Secondary | ICD-10-CM | POA: Diagnosis not present

## 2021-01-04 DIAGNOSIS — J439 Emphysema, unspecified: Secondary | ICD-10-CM | POA: Diagnosis not present

## 2021-01-04 DIAGNOSIS — Z5111 Encounter for antineoplastic chemotherapy: Secondary | ICD-10-CM | POA: Diagnosis not present

## 2021-01-04 DIAGNOSIS — Z8249 Family history of ischemic heart disease and other diseases of the circulatory system: Secondary | ICD-10-CM | POA: Diagnosis not present

## 2021-01-04 DIAGNOSIS — E785 Hyperlipidemia, unspecified: Secondary | ICD-10-CM | POA: Diagnosis not present

## 2021-01-04 DIAGNOSIS — C3431 Malignant neoplasm of lower lobe, right bronchus or lung: Secondary | ICD-10-CM | POA: Diagnosis not present

## 2021-01-04 DIAGNOSIS — Z809 Family history of malignant neoplasm, unspecified: Secondary | ICD-10-CM | POA: Diagnosis not present

## 2021-01-04 DIAGNOSIS — I10 Essential (primary) hypertension: Secondary | ICD-10-CM | POA: Diagnosis not present

## 2021-01-04 MED ORDER — SODIUM CHLORIDE 0.9 % IV SOLN
100.0000 mg/m2 | Freq: Once | INTRAVENOUS | Status: AC
  Administered 2021-01-04: 200 mg via INTRAVENOUS
  Filled 2021-01-04: qty 10

## 2021-01-04 MED ORDER — SODIUM CHLORIDE 0.9% FLUSH
10.0000 mL | INTRAVENOUS | Status: DC | PRN
  Administered 2021-01-04: 10 mL

## 2021-01-04 MED ORDER — PROCHLORPERAZINE EDISYLATE 10 MG/2ML IJ SOLN
10.0000 mg | Freq: Once | INTRAMUSCULAR | Status: AC
  Administered 2021-01-04: 10 mg via INTRAVENOUS
  Filled 2021-01-04: qty 2

## 2021-01-04 MED ORDER — SODIUM CHLORIDE 0.9 % IV SOLN
10.0000 mg | Freq: Once | INTRAVENOUS | Status: AC
  Administered 2021-01-04: 10 mg via INTRAVENOUS
  Filled 2021-01-04: qty 10

## 2021-01-04 MED ORDER — SODIUM CHLORIDE 0.9 % IV SOLN
Freq: Once | INTRAVENOUS | Status: AC
Start: 2021-01-04 — End: 2021-01-04

## 2021-01-04 MED ORDER — HEPARIN SOD (PORK) LOCK FLUSH 100 UNIT/ML IV SOLN
500.0000 [IU] | Freq: Once | INTRAVENOUS | Status: AC | PRN
  Administered 2021-01-04: 500 [IU]

## 2021-01-04 NOTE — Progress Notes (Signed)
Patient c/o nausea today. Dr Lorenso Courier notified and order given for 10mg  compazine IV.

## 2021-01-04 NOTE — Patient Instructions (Signed)
[facility]  Discharge Instructions: Thank you for choosing [facility] to provide your oncology and hematology care.   If you have a lab appointment with the Cancer Center, please go directly to the Cancer Center and check in at the registration area.   Wear comfortable clothing and clothing appropriate for easy access to any Portacath or PICC line.   We strive to give you quality time with your provider. You may need to reschedule your appointment if you arrive late (15 or more minutes).  Arriving late affects you and other patients whose appointments are after yours.  Also, if you miss three or more appointments without notifying the office, you may be dismissed from the clinic at the provider's discretion.      For prescription refill requests, have your pharmacy contact our office and allow 72 hours for refills to be completed.    Today you received the following chemotherapy and/or immunotherapy agents: etoposide.     To help prevent nausea and vomiting after your treatment, we encourage you to take your nausea medication as directed.  BELOW ARE SYMPTOMS THAT SHOULD BE REPORTED IMMEDIATELY: . *FEVER GREATER THAN 100.4 F (38 C) OR HIGHER . *CHILLS OR SWEATING . *NAUSEA AND VOMITING THAT IS NOT CONTROLLED WITH YOUR NAUSEA MEDICATION . *UNUSUAL SHORTNESS OF BREATH . *UNUSUAL BRUISING OR BLEEDING . *URINARY PROBLEMS (pain or burning when urinating, or frequent urination) . *BOWEL PROBLEMS (unusual diarrhea, constipation, pain near the anus) . TENDERNESS IN MOUTH AND THROAT WITH OR WITHOUT PRESENCE OF ULCERS (sore throat, sores in mouth, or a toothache) . UNUSUAL RASH, SWELLING OR PAIN  . UNUSUAL VAGINAL DISCHARGE OR ITCHING   Items with * indicate a potential emergency and should be followed up as soon as possible or go to the Emergency Department if any problems should occur.  Please show the CHEMOTHERAPY ALERT CARD or IMMUNOTHERAPY ALERT  CARD at check-in to the Emergency Department and triage nurse.  Should you have questions after your visit or need to cancel or reschedule your appointment, please contact [facility]  Dept: [phone]  and follow the prompts.  Office hours are 8:00 a.m. to 4:30 p.m. Monday - Friday. Please note that voicemails left after 4:00 p.m. may not be returned until the following business day.  We are closed weekends and major holidays. You have access to a nurse at all times for urgent questions. Please call the main number to the clinic Dept: [phone] and follow the prompts.   For any non-urgent questions, you may also contact your provider using MyChart. We now offer e-Visits for anyone 18 and older to request care online for non-urgent symptoms. For details visit [email or web address].   Also download the MyChart app! Go to the app store, search "MyChart", open the app, select [facility], and log in with your MyChart username and password.  Due to Covid, a mask is required upon entering the hospital/clinic. If you do not have a mask, one will be given to you upon arrival. For doctor visits, patients may have 1 support person aged 18 or older with them. For treatment visits, patients cannot have anyone with them due to current Covid guidelines and our immunocompromised population.   

## 2021-01-05 ENCOUNTER — Ambulatory Visit: Payer: Medicare HMO | Admitting: Physician Assistant

## 2021-01-05 ENCOUNTER — Ambulatory Visit (INDEPENDENT_AMBULATORY_CARE_PROVIDER_SITE_OTHER): Payer: Medicare HMO | Admitting: Physician Assistant

## 2021-01-05 ENCOUNTER — Encounter: Payer: Self-pay | Admitting: Physician Assistant

## 2021-01-05 ENCOUNTER — Telehealth: Payer: Self-pay | Admitting: Pulmonary Disease

## 2021-01-05 VITALS — BP 167/85 | HR 83 | Temp 98.2°F | Ht 62.0 in | Wt 204.2 lb

## 2021-01-05 DIAGNOSIS — F419 Anxiety disorder, unspecified: Secondary | ICD-10-CM | POA: Diagnosis not present

## 2021-01-05 DIAGNOSIS — C3490 Malignant neoplasm of unspecified part of unspecified bronchus or lung: Secondary | ICD-10-CM

## 2021-01-05 DIAGNOSIS — E1140 Type 2 diabetes mellitus with diabetic neuropathy, unspecified: Secondary | ICD-10-CM

## 2021-01-05 DIAGNOSIS — R69 Illness, unspecified: Secondary | ICD-10-CM | POA: Diagnosis not present

## 2021-01-05 DIAGNOSIS — F32A Depression, unspecified: Secondary | ICD-10-CM

## 2021-01-05 LAB — MAGNESIUM: Magnesium: 1.7 mg/dL (ref 1.5–2.5)

## 2021-01-05 LAB — VITAMIN B12: Vitamin B-12: >1550 pg/mL — ABNORMAL HIGH (ref 211–911)

## 2021-01-05 LAB — HEMOGLOBIN A1C: Hgb A1c MFr Bld: 6.5 % (ref 4.6–6.5)

## 2021-01-05 MED ORDER — ALPRAZOLAM 1 MG PO TABS: ORAL_TABLET | ORAL | 2 refills | Status: DC

## 2021-01-05 NOTE — Patient Instructions (Addendum)
Very good to see you today. Please have a magnesium, Ha1c, & B12 level drawn today. I have sent a refill of your medication.  Keep me posted. Merry Christmas!

## 2021-01-05 NOTE — Progress Notes (Signed)
Subjective:    Patient ID: [name], female    DOB: [birth year], 65 y.o.   MRN: [id number]  Chief Complaint  Patient presents with   Hospital Followup    HPI Patient with history of chronic back pain, COPD, non-small cell lung cancer currently undergoing chemotherapy, type 2 diabetes, and anxiety, is in today for hospital f/up and medication refill.   Patient was admitted to [facility] on 12/19/2020 and discharged on 12/22/2020 for sepsis present on admission secondary to E. coli suspected from UTI source.  She received few doses of IV antibiotics. She was discharged in stable condition on 10 more days of Keflex p.o.  Patient has been tired and using her walker more often, but she says that her faith is strong and her husband is supportive more than ever before, and she does feel like she is having renewed strength.  She has been following up with her oncologist team. 01/02/2021: Cycle 3 Day 1 of Carbo/Etop/Atezolizumab.   Past Medical History:  Diagnosis Date   Allergic rhinitis    Anemia    Anxiety    Chicken pox    Chronic back pain    COPD (chronic obstructive pulmonary disease) (HCC)    Depression    DM (diabetes mellitus) ([facility])    Essential hypertension    GERD (gastroesophageal reflux disease)    Headache    migraines   History of gastritis    EGD 2015   History of home oxygen therapy    2 liters at hs last 6 months   Hyperlipidemia    Hypothyroidism    Migraines    Osteoarthritis    oa   Scoliosis     Past Surgical History:  Procedure Laterality Date   APPENDECTOMY     1985   BIOPSY  07/24/2017   Procedure: BIOPSY;  Surgeon: Milus Banister, MD;  Location: [facility];  Service: Endoscopy;;   CARDIAC CATHETERIZATION N/A 10/31/2015   Procedure: Left Heart Cath and Coronary Angiography;  Surgeon: Leonie Man, MD;  Location: [facility];  Service: Cardiovascular;  Laterality: N/A;   CARPAL TUNNEL RELEASE Left    CARPAL TUNNEL  RELEASE Right    CHOLECYSTECTOMY  late 1980's   COLONOSCOPY WITH PROPOFOL N/A 07/24/2017   Procedure: COLONOSCOPY WITH PROPOFOL;  Surgeon: Milus Banister, MD;  [facility];  Service: Endoscopy;  Laterality: N/A;   ESOPHAGOGASTRODUODENOSCOPY N/A 07/24/2017   Procedure: ESOPHAGOGASTRODUODENOSCOPY (EGD);  Surgeon: Milus Banister, MD;  Location: [facility];  Service: Endoscopy;  Laterality: N/A;   ESOPHAGOGASTRODUODENOSCOPY (EGD) WITH PROPOFOL N/A 10/19/2020   Procedure: ESOPHAGOGASTRODUODENOSCOPY (EGD) WITH PROPOFOL;  Surgeon: Milus Banister, MD;  [facility];  Service: Endoscopy;  Laterality: N/A;   FINE NEEDLE ASPIRATION N/A 10/19/2020   Procedure: FINE NEEDLE ASPIRATION (FNA) LINEAR;  Surgeon: Milus Banister, MD;  [facility];  Service: Endoscopy;  Laterality: N/A;   GALLBLADDER SURGERY  1991   HIP CLOSED REDUCTION Right 01/08/2016   Procedure: CLOSED MANIPULATION HIP;  Surgeon: Susa Day, MD;  Location: [facility];  Service: Orthopedics;  Laterality: Right;   HIP CLOSED REDUCTION Right 01/19/2016   Procedure: ATTEMPTED CLOSED REDUCTION RIGHT HIP;  Surgeon: Wylene Simmer, MD;  Location: [facility];  Service: Orthopedics;  Laterality: Right;   HIP CLOSED REDUCTION Right 01/20/2016   Procedure: CLOSED REDUCTION RIGHT TOTAL HIP;  Surgeon: Paralee Cancel, MD;  Location: [facility];  Service: Orthopedics;  Laterality: Right;   HIP CLOSED  REDUCTION Right 02/17/2016   Procedure: CLOSED REDUCTION RIGHT TOTAL HIP;  Surgeon: Rod Can, MD;  Location: [facility];  Service: Orthopedics;  Laterality: Right;   HIP CLOSED REDUCTION Right 02/28/2016   Procedure: CLOSED REDUCTION HIP;  Surgeon: Nicholes Stairs, MD;  [facility];  Service: Orthopedics;  Laterality: Right;   IR IMAGING GUIDED PORT INSERTION  11/01/2020   POLYPECTOMY  07/24/2017   Procedure: POLYPECTOMY;  Surgeon: Milus Banister, MD;  [facility];  Service: Endoscopy;;   [place], with 1 ovary removed and 2 nd ovary removed 2003   TOTAL HIP ARTHROPLASTY Right    Original surgery 2006 with revision 2010   TOTAL HIP REVISION Right 01/01/2016   Procedure: TOTAL HIP REVISION;  Surgeon: Paralee Cancel, MD;  [facility];  Service: Orthopedics;  Laterality: Right;   TOTAL HIP REVISION Right 03/02/2016   Procedure: TOTAL HIP REVISION;  Surgeon: Paralee Cancel, MD;  [facility];  Service: Orthopedics;  Laterality: Right;   TOTAL HIP REVISION Right 09/02/2016   Procedure: Right hip constrained liner- posterior;  Surgeon: Paralee Cancel, MD;  [facility];  Service: Orthopedics;  Laterality: Right;   ULNAR NERVE TRANSPOSITION Right    UPPER ESOPHAGEAL ENDOSCOPIC ULTRASOUND (EUS) N/A 10/19/2020   Procedure: UPPER ESOPHAGEAL ENDOSCOPIC ULTRASOUND (EUS);  Surgeon: Milus Banister, MD;  Location: [facility];  Service: Endoscopy;  Laterality: N/A;  periduodenal lesion    Family History  Problem Relation Age of Onset   COPD Mother    Heart disease Mother    Lung disease Father        Asbestosis   Heart attack Father    Heart disease Father    Cerebral aneurysm Brother    Aneurysm Brother        Brain   Drug abuse Daughter    Epilepsy Son    Alcohol abuse Son    Drug abuse Son    Arthritis Maternal Grandmother    Heart disease Maternal Grandmother    Asthma Maternal Grandfather    Cancer Maternal Grandfather    Arthritis Paternal Grandmother    Heart disease Paternal Grandmother    Stroke Paternal Grandmother    Early death Paternal Grandfather    Heart disease Paternal Grandfather     Social History   Tobacco Use   Smoking status: Every Day    Packs/day: 2.00    Years: 46.00    Pack years: 92.00    Types: Cigarettes   Smokeless tobacco: Never   Tobacco comments:    Currently smoking 1.5ppd as of 11/10/20  Vaping Use   Vaping Use: Never used  Substance Use Topics   Alcohol use: No   Drug use: No     Allergies   Allergen Reactions   Metformin And Related Diarrhea   Nsaids Diarrhea   Aleve [Naproxen Sodium] Other (See Comments)    Headache    Codeine Nausea Only and Other (See Comments)    GI upset   Penicillins Nausea Only and Other (See Comments)    GI upset    Sulfonamide Derivatives Hives    Review of Systems NEGATIVE UNLESS OTHERWISE INDICATED IN HPI      Objective:     BP (!) 167/85   Pulse 83   Temp 98.2 F (36.8 C)   Ht 5\' 2"  (1.575 m)   Wt 204 lb 4 oz (92.6 kg)   SpO2  98%   BMI 37.36 kg/m   Wt Readings from Last 3 Encounters:  01/05/21 204 lb 4 oz (92.6 kg)  01/02/21 206 lb 8 oz (93.7 kg)  12/19/20 200 lb (90.7 kg)    BP Readings from Last 3 Encounters:  01/06/21 (!) 149/72  01/05/21 (!) 167/85  01/04/21 (!) 173/89     Physical Exam Vitals and nursing note reviewed.  Constitutional:      Appearance: Normal appearance. She is obese. She is not toxic-appearing.     Comments: Cane with her   HENT:     Head: Normocephalic and atraumatic.     Right Ear: External ear normal.     Left Ear: External ear normal.     Nose: Nose normal.     Mouth/Throat:     Mouth: Mucous membranes are moist.  Eyes:     Extraocular Movements: Extraocular movements intact.     Conjunctiva/sclera: Conjunctivae normal.     Pupils: Pupils are equal, round, and reactive to light.  Cardiovascular:     Rate and Rhythm: Normal rate and regular rhythm.     Pulses: Normal pulses.  Pulmonary:     Effort: Pulmonary effort is normal.  Musculoskeletal:        General: Normal range of motion.     Cervical back: Normal range of motion and neck supple.  Skin:    General: Skin is warm and dry.  Neurological:     General: No focal deficit present.     Mental Status: She is alert and oriented to [name], place, and time.  Psychiatric:        Mood and Affect: Mood and affect normal. Mood is not depressed. Affect is not tearful.        Behavior: Behavior normal.        Thought Content:  Thought content normal.        Judgment: Judgment normal.       Assessment & Plan:   Problem List Items Addressed This Visit       Respiratory   Small cell lung cancer ([facility]) - Primary   Relevant Medications   ALPRAZolam (XANAX) 1 MG tablet   Other Relevant Orders   Magnesium (Completed)     Other   Anxiety and depression   Relevant Medications   ALPRAZolam (XANAX) 1 MG tablet   Other Visit Diagnoses     Hypomagnesemia       Relevant Orders   Magnesium (Completed)   Type 2 diabetes mellitus with diabetic neuropathy, without long-term current use of insulin (HCC)       Relevant Orders   Vitamin B12 (Completed)   Hemoglobin A1c (Completed)        Meds ordered this encounter  Medications   ALPRAZolam (XANAX) 1 MG tablet    Sig: Take 1 tablet po TID as needed for anxiety.    Dispense:  90 tablet    Refill:  2   Plan: -I personally reviewed the patient's hospital visit, labs that were performed at the end of November. -Need to recheck A1c, B12, magnesium level today -She will continue follow-up with her oncologist as she is scheduled and currently going through chemotherapy. -PDMP reviewed today, no red flags, filling appropriately.  Refilled her Xanax 1 mg 3 times daily. -I am glad to see her seemingly still doing so well despite everything she is up against right now.  -I plan to see her back in the new year and she knows to call sooner  if she has any concerns. -Medications were reconciled today.   This note was prepared with assistance of Systems analyst. Occasional wrong-word or sound-a-like substitutions may have occurred due to the inherent limitations of voice recognition software.  Time Spent: 36 minutes of total time was spent on the date of the encounter performing the following actions: chart review prior to seeing the patient, obtaining history, performing a medically necessary exam, counseling on the treatment plan, placing orders, and  documenting in our EHR.    Marlet Korte M Westin Knotts, PA-C

## 2021-01-06 ENCOUNTER — Other Ambulatory Visit: Payer: Self-pay

## 2021-01-06 ENCOUNTER — Ambulatory Visit: Payer: Medicare HMO

## 2021-01-06 ENCOUNTER — Inpatient Hospital Stay: Payer: Medicare HMO

## 2021-01-06 VITALS — BP 149/72 | HR 98 | Temp 97.7°F | Resp 18

## 2021-01-06 DIAGNOSIS — Z836 Family history of other diseases of the respiratory system: Secondary | ICD-10-CM | POA: Diagnosis not present

## 2021-01-06 DIAGNOSIS — F17210 Nicotine dependence, cigarettes, uncomplicated: Secondary | ICD-10-CM | POA: Diagnosis not present

## 2021-01-06 DIAGNOSIS — Z811 Family history of alcohol abuse and dependence: Secondary | ICD-10-CM | POA: Diagnosis not present

## 2021-01-06 DIAGNOSIS — Z79899 Other long term (current) drug therapy: Secondary | ICD-10-CM | POA: Diagnosis not present

## 2021-01-06 DIAGNOSIS — I2510 Atherosclerotic heart disease of native coronary artery without angina pectoris: Secondary | ICD-10-CM | POA: Diagnosis not present

## 2021-01-06 DIAGNOSIS — C3431 Malignant neoplasm of lower lobe, right bronchus or lung: Secondary | ICD-10-CM | POA: Diagnosis not present

## 2021-01-06 DIAGNOSIS — E785 Hyperlipidemia, unspecified: Secondary | ICD-10-CM | POA: Diagnosis not present

## 2021-01-06 DIAGNOSIS — Z5112 Encounter for antineoplastic immunotherapy: Secondary | ICD-10-CM | POA: Diagnosis not present

## 2021-01-06 DIAGNOSIS — I10 Essential (primary) hypertension: Secondary | ICD-10-CM | POA: Diagnosis not present

## 2021-01-06 DIAGNOSIS — Z8249 Family history of ischemic heart disease and other diseases of the circulatory system: Secondary | ICD-10-CM | POA: Diagnosis not present

## 2021-01-06 DIAGNOSIS — Z814 Family history of other substance abuse and dependence: Secondary | ICD-10-CM | POA: Diagnosis not present

## 2021-01-06 DIAGNOSIS — E119 Type 2 diabetes mellitus without complications: Secondary | ICD-10-CM | POA: Diagnosis not present

## 2021-01-06 DIAGNOSIS — D72829 Elevated white blood cell count, unspecified: Secondary | ICD-10-CM | POA: Diagnosis not present

## 2021-01-06 DIAGNOSIS — Z820 Family history of epilepsy and other diseases of the nervous system: Secondary | ICD-10-CM | POA: Diagnosis not present

## 2021-01-06 DIAGNOSIS — Z5111 Encounter for antineoplastic chemotherapy: Secondary | ICD-10-CM | POA: Diagnosis not present

## 2021-01-06 DIAGNOSIS — Z5189 Encounter for other specified aftercare: Secondary | ICD-10-CM | POA: Diagnosis not present

## 2021-01-06 DIAGNOSIS — Z809 Family history of malignant neoplasm, unspecified: Secondary | ICD-10-CM | POA: Diagnosis not present

## 2021-01-06 DIAGNOSIS — J439 Emphysema, unspecified: Secondary | ICD-10-CM | POA: Diagnosis not present

## 2021-01-06 DIAGNOSIS — C3490 Malignant neoplasm of unspecified part of unspecified bronchus or lung: Secondary | ICD-10-CM

## 2021-01-06 DIAGNOSIS — R5383 Other fatigue: Secondary | ICD-10-CM | POA: Diagnosis not present

## 2021-01-06 DIAGNOSIS — Z823 Family history of stroke: Secondary | ICD-10-CM | POA: Diagnosis not present

## 2021-01-06 DIAGNOSIS — I700 Atherosclerosis of aorta: Secondary | ICD-10-CM | POA: Diagnosis not present

## 2021-01-06 MED ORDER — PEGFILGRASTIM-CBQV 6 MG/0.6ML ~~LOC~~ SOSY
6.0000 mg | PREFILLED_SYRINGE | Freq: Once | SUBCUTANEOUS | Status: AC
  Administered 2021-01-06: 6 mg via SUBCUTANEOUS
  Filled 2021-01-06: qty 0.6

## 2021-01-08 DIAGNOSIS — E1140 Type 2 diabetes mellitus with diabetic neuropathy, unspecified: Secondary | ICD-10-CM | POA: Insufficient documentation

## 2021-01-08 NOTE — Telephone Encounter (Signed)
Dr. Loanne Drilling we only have Trelegy 100 available in clinic as far as samples. Please advise.

## 2021-01-10 ENCOUNTER — Telehealth: Payer: Self-pay

## 2021-01-10 MED ORDER — TRELEGY ELLIPTA 100-62.5-25 MCG/ACT IN AEPB: 1.0000 | INHALATION_SPRAY | Freq: Every day | RESPIRATORY_TRACT | 0 refills | Status: DC

## 2021-01-10 NOTE — Telephone Encounter (Signed)
Ok for Trelegy 100 samples however if she has uncontrolled symptoms, she will need to contact our office so we can change inhalers.

## 2021-01-10 NOTE — Telephone Encounter (Signed)
Called patient but she did not answer. At time of call, we have 2 Trelegy 100 samples. Left message for her to call back.

## 2021-01-10 NOTE — Telephone Encounter (Signed)
Sample will be placed at the desk for patient.

## 2021-01-10 NOTE — Telephone Encounter (Signed)
Patient to pick up Trelegy samples on 01/11/2021. Patient phone number is [phone].

## 2021-01-10 NOTE — Telephone Encounter (Signed)
Patient called in stating that she has been having burning and a bad odor when she uses the bathroom. Essence believes she has another UTI and would like medication called in, offered an appointment and patient refused.

## 2021-01-11 DIAGNOSIS — J449 Chronic obstructive pulmonary disease, unspecified: Secondary | ICD-10-CM | POA: Diagnosis not present

## 2021-01-11 DIAGNOSIS — N189 Chronic kidney disease, unspecified: Secondary | ICD-10-CM | POA: Diagnosis not present

## 2021-01-11 NOTE — Telephone Encounter (Signed)
Noted, unfortunately, her choice to seek care or not.

## 2021-01-12 ENCOUNTER — Telehealth: Payer: Self-pay

## 2021-01-12 NOTE — Chronic Care Management (AMB) (Signed)
Chronic Care Management  ° °Note ° °01/12/2021 °Name: [name] MRN: [id number] DOB: 11/26/1955 ° °[name] is a 65 y.o. year old female who is a primary care patient of Allwardt, Alyssa M, PA-C. I reached out to [name] by phone today in response to a referral sent by [name]'s PCP. ° °[name] was given information about Chronic Care Management services today including:  °CCM service includes personalized support from designated clinical staff supervised by her physician, including individualized plan of care and coordination with other care providers °24/7 contact phone numbers for assistance for urgent and routine care needs. °Service will only be billed when office clinical staff spend 20 minutes or more in a month to coordinate care. °Only one practitioner may furnish and bill the service in a calendar month. °The patient may stop CCM services at any time (effective at the end of the month) by phone call to the office staff. °The patient is responsible for co-pay (up to 20% after annual deductible is met) if co-pay is required by the individual health plan.  ° °Patient agreed to services and verbal consent obtained.  ° °Follow up plan: °Telephone appointment with care management team member scheduled for:02/01/2021 ° ° , RMA °Care Guide, Embedded Care Coordination °[facility]  °[place], [place] [address] °Direct Dial: [phone] [email or web address] °Website: [email or web address]  ° °

## 2021-01-17 ENCOUNTER — Other Ambulatory Visit: Payer: Self-pay

## 2021-01-17 ENCOUNTER — Ambulatory Visit (HOSPITAL_COMMUNITY)
Admission: RE | Admit: 2021-01-17 | Discharge: 2021-01-17 | Disposition: A | Discharge status: Home / Self Care | Payer: Medicare HMO | Source: Ambulatory Visit | Attending: Hematology and Oncology | Admitting: Hematology and Oncology

## 2021-01-17 DIAGNOSIS — C3490 Malignant neoplasm of unspecified part of unspecified bronchus or lung: Secondary | ICD-10-CM | POA: Insufficient documentation

## 2021-01-17 DIAGNOSIS — I2510 Atherosclerotic heart disease of native coronary artery without angina pectoris: Secondary | ICD-10-CM | POA: Diagnosis not present

## 2021-01-17 DIAGNOSIS — J432 Centrilobular emphysema: Secondary | ICD-10-CM | POA: Diagnosis not present

## 2021-01-17 DIAGNOSIS — K838 Other specified diseases of biliary tract: Secondary | ICD-10-CM | POA: Diagnosis not present

## 2021-01-17 DIAGNOSIS — M1612 Unilateral primary osteoarthritis, left hip: Secondary | ICD-10-CM | POA: Diagnosis not present

## 2021-01-17 DIAGNOSIS — K7689 Other specified diseases of liver: Secondary | ICD-10-CM | POA: Diagnosis not present

## 2021-01-17 DIAGNOSIS — M47814 Spondylosis without myelopathy or radiculopathy, thoracic region: Secondary | ICD-10-CM | POA: Diagnosis not present

## 2021-01-17 MED ORDER — HEPARIN SOD (PORK) LOCK FLUSH 100 UNIT/ML IV SOLN
INTRAVENOUS | Status: AC
  Filled 2021-01-17: qty 5

## 2021-01-17 MED ORDER — IOHEXOL 350 MG/ML SOLN
75.0000 mL | Freq: Once | INTRAVENOUS | Status: AC | PRN
  Administered 2021-01-17: 16:00:00 75 mL via INTRAVENOUS

## 2021-01-17 MED ORDER — HEPARIN SOD (PORK) LOCK FLUSH 100 UNIT/ML IV SOLN
500.0000 [IU] | Freq: Once | INTRAVENOUS | Status: AC
Start: 2021-01-17 — End: 2021-01-17
  Administered 2021-01-17: 16:00:00 500 [IU] via INTRAVENOUS

## 2021-01-17 MED ORDER — SODIUM CHLORIDE (PF) 0.9 % IJ SOLN
INTRAMUSCULAR | Status: AC
  Filled 2021-01-17: qty 50

## 2021-01-19 ENCOUNTER — Telehealth: Payer: Self-pay | Admitting: *Deleted

## 2021-01-19 NOTE — Telephone Encounter (Signed)
TCT patient regarding recent scan results. No answer but was able to leave vm message on her identified phone. Informed pt that her recent scan results are very good. The lung mas in her right central lobe has completely resolved. Advised that Dr. Lorenso Courier will discuss these results further with her @ her next appt on 01/23/21

## 2021-01-19 NOTE — Telephone Encounter (Signed)
-----   Message from Orson Slick, MD sent at 01/19/2021  9:41 AM EST ----- Please let [name] know the results of her CT scan were excellent.  The small cell lung cancer mass in her lung has near completely dissipated.  We will discuss the results further when we see her next week.  Overall this shows a very strong response to chemotherapy. ----- Message ----- From: Interface, Rad Results In Sent: 01/18/2021  10:41 AM EST To: Orson Slick, MD

## 2021-01-23 ENCOUNTER — Other Ambulatory Visit: Payer: Self-pay

## 2021-01-23 ENCOUNTER — Other Ambulatory Visit: Payer: Self-pay | Admitting: Internal Medicine

## 2021-01-23 ENCOUNTER — Other Ambulatory Visit: Payer: Medicare HMO

## 2021-01-23 ENCOUNTER — Inpatient Hospital Stay (HOSPITAL_BASED_OUTPATIENT_CLINIC_OR_DEPARTMENT_OTHER): Payer: Medicare HMO | Admitting: Hematology and Oncology

## 2021-01-23 ENCOUNTER — Other Ambulatory Visit: Payer: Self-pay | Admitting: Physical Medicine and Rehabilitation

## 2021-01-23 ENCOUNTER — Inpatient Hospital Stay: Payer: Medicare HMO

## 2021-01-23 VITALS — BP 126/66 | HR 92 | Temp 98.2°F | Resp 18 | Wt 199.5 lb

## 2021-01-23 DIAGNOSIS — I700 Atherosclerosis of aorta: Secondary | ICD-10-CM | POA: Diagnosis not present

## 2021-01-23 DIAGNOSIS — Z5111 Encounter for antineoplastic chemotherapy: Secondary | ICD-10-CM | POA: Diagnosis not present

## 2021-01-23 DIAGNOSIS — Z95828 Presence of other vascular implants and grafts: Secondary | ICD-10-CM | POA: Diagnosis not present

## 2021-01-23 DIAGNOSIS — J439 Emphysema, unspecified: Secondary | ICD-10-CM | POA: Diagnosis not present

## 2021-01-23 DIAGNOSIS — I10 Essential (primary) hypertension: Secondary | ICD-10-CM | POA: Diagnosis not present

## 2021-01-23 DIAGNOSIS — Z814 Family history of other substance abuse and dependence: Secondary | ICD-10-CM | POA: Diagnosis not present

## 2021-01-23 DIAGNOSIS — Z820 Family history of epilepsy and other diseases of the nervous system: Secondary | ICD-10-CM | POA: Diagnosis not present

## 2021-01-23 DIAGNOSIS — R5383 Other fatigue: Secondary | ICD-10-CM | POA: Diagnosis not present

## 2021-01-23 DIAGNOSIS — C3490 Malignant neoplasm of unspecified part of unspecified bronchus or lung: Secondary | ICD-10-CM

## 2021-01-23 DIAGNOSIS — D72829 Elevated white blood cell count, unspecified: Secondary | ICD-10-CM | POA: Diagnosis not present

## 2021-01-23 DIAGNOSIS — Z79899 Other long term (current) drug therapy: Secondary | ICD-10-CM | POA: Diagnosis not present

## 2021-01-23 DIAGNOSIS — Z8249 Family history of ischemic heart disease and other diseases of the circulatory system: Secondary | ICD-10-CM | POA: Diagnosis not present

## 2021-01-23 DIAGNOSIS — Z811 Family history of alcohol abuse and dependence: Secondary | ICD-10-CM | POA: Diagnosis not present

## 2021-01-23 DIAGNOSIS — E119 Type 2 diabetes mellitus without complications: Secondary | ICD-10-CM | POA: Diagnosis not present

## 2021-01-23 DIAGNOSIS — E785 Hyperlipidemia, unspecified: Secondary | ICD-10-CM | POA: Diagnosis not present

## 2021-01-23 DIAGNOSIS — F17210 Nicotine dependence, cigarettes, uncomplicated: Secondary | ICD-10-CM | POA: Diagnosis not present

## 2021-01-23 DIAGNOSIS — Z836 Family history of other diseases of the respiratory system: Secondary | ICD-10-CM | POA: Diagnosis not present

## 2021-01-23 DIAGNOSIS — Z823 Family history of stroke: Secondary | ICD-10-CM | POA: Diagnosis not present

## 2021-01-23 DIAGNOSIS — Z809 Family history of malignant neoplasm, unspecified: Secondary | ICD-10-CM | POA: Diagnosis not present

## 2021-01-23 DIAGNOSIS — Z5112 Encounter for antineoplastic immunotherapy: Secondary | ICD-10-CM | POA: Diagnosis not present

## 2021-01-23 DIAGNOSIS — I2510 Atherosclerotic heart disease of native coronary artery without angina pectoris: Secondary | ICD-10-CM | POA: Diagnosis not present

## 2021-01-23 DIAGNOSIS — C3431 Malignant neoplasm of lower lobe, right bronchus or lung: Secondary | ICD-10-CM | POA: Diagnosis not present

## 2021-01-23 DIAGNOSIS — Z5189 Encounter for other specified aftercare: Secondary | ICD-10-CM | POA: Diagnosis not present

## 2021-01-23 LAB — CBC WITH DIFFERENTIAL (CANCER CENTER ONLY)
Abs Immature Granulocytes: 0.4 10*3/uL — ABNORMAL HIGH (ref 0.00–0.07)
Basophils Absolute: 0.1 10*3/uL (ref 0.0–0.1)
Basophils Relative: 1 %
Eosinophils Absolute: 0.1 10*3/uL (ref 0.0–0.5)
Eosinophils Relative: 1 %
HCT: 30.6 % — ABNORMAL LOW (ref 36.0–46.0)
Hemoglobin: 9.6 g/dL — ABNORMAL LOW (ref 12.0–15.0)
Immature Granulocytes: 4 %
Lymphocytes Relative: 29 %
Lymphs Abs: 2.9 10*3/uL (ref 0.7–4.0)
MCH: 29 pg (ref 26.0–34.0)
MCHC: 31.4 g/dL (ref 30.0–36.0)
MCV: 92.4 fL (ref 80.0–100.0)
Monocytes Absolute: 0.8 10*3/uL (ref 0.1–1.0)
Monocytes Relative: 8 %
Neutro Abs: 5.6 10*3/uL (ref 1.7–7.7)
Neutrophils Relative %: 57 %
Platelet Count: 244 10*3/uL (ref 150–400)
RBC: 3.31 MIL/uL — ABNORMAL LOW (ref 3.87–5.11)
RDW: 26 % — ABNORMAL HIGH (ref 11.5–15.5)
WBC Count: 9.9 10*3/uL (ref 4.0–10.5)
nRBC: 0.6 % — ABNORMAL HIGH (ref 0.0–0.2)

## 2021-01-23 LAB — CMP (CANCER CENTER ONLY)
ALT: 11 U/L (ref 0–44)
AST: 15 U/L (ref 15–41)
Albumin: 3.5 g/dL (ref 3.5–5.0)
Alkaline Phosphatase: 107 U/L (ref 38–126)
Anion gap: 8 (ref 5–15)
BUN: 8 mg/dL (ref 8–23)
CO2: 26 mmol/L (ref 22–32)
Calcium: 8.7 mg/dL — ABNORMAL LOW (ref 8.9–10.3)
Chloride: 104 mmol/L (ref 98–111)
Creatinine: 0.82 mg/dL (ref 0.44–1.00)
GFR, Estimated: >60 mL/min (ref >60)
Glucose, Bld: 91 mg/dL (ref 70–99)
Potassium: 3.9 mmol/L (ref 3.5–5.1)
Sodium: 138 mmol/L (ref 135–145)
Total Bilirubin: 0.4 mg/dL (ref 0.3–1.2)
Total Protein: 6.2 g/dL — ABNORMAL LOW (ref 6.5–8.1)

## 2021-01-23 LAB — TSH: TSH: 0.083 u[IU]/mL — ABNORMAL LOW (ref 0.308–3.960)

## 2021-01-23 MED ORDER — SODIUM CHLORIDE 0.9 % IV SOLN
1200.0000 mg | Freq: Once | INTRAVENOUS | Status: AC
  Administered 2021-01-23: 14:00:00 1200 mg via INTRAVENOUS
  Filled 2021-01-23: qty 20

## 2021-01-23 MED ORDER — SODIUM CHLORIDE 0.9 % IV SOLN
100.0000 mg/m2 | Freq: Once | INTRAVENOUS | Status: AC
  Administered 2021-01-23: 16:00:00 200 mg via INTRAVENOUS
  Filled 2021-01-23: qty 10

## 2021-01-23 MED ORDER — SODIUM CHLORIDE 0.9 % IV SOLN
Freq: Once | INTRAVENOUS | Status: AC
Start: 2021-01-23 — End: 2021-01-23

## 2021-01-23 MED ORDER — SODIUM CHLORIDE 0.9 % IV SOLN
150.0000 mg | Freq: Once | INTRAVENOUS | Status: AC
  Administered 2021-01-23: 13:00:00 150 mg via INTRAVENOUS
  Filled 2021-01-23: qty 150

## 2021-01-23 MED ORDER — SODIUM CHLORIDE 0.9 % IV SOLN
10.0000 mg | Freq: Once | INTRAVENOUS | Status: AC
  Administered 2021-01-23: 13:00:00 10 mg via INTRAVENOUS
  Filled 2021-01-23: qty 10

## 2021-01-23 MED ORDER — PALONOSETRON HCL INJECTION 0.25 MG/5ML
0.2500 mg | Freq: Once | INTRAVENOUS | Status: AC
  Administered 2021-01-23: 13:00:00 0.25 mg via INTRAVENOUS
  Filled 2021-01-23: qty 5

## 2021-01-23 MED ORDER — SODIUM CHLORIDE 0.9% FLUSH
10.0000 mL | Freq: Once | INTRAVENOUS | Status: AC
  Administered 2021-01-23: 12:00:00 10 mL

## 2021-01-23 MED ORDER — SODIUM CHLORIDE 0.9 % IV SOLN
646.5000 mg | Freq: Once | INTRAVENOUS | Status: AC
  Administered 2021-01-23: 15:00:00 650 mg via INTRAVENOUS
  Filled 2021-01-23: qty 65

## 2021-01-23 NOTE — Progress Notes (Signed)
[facility] [phone]   [phone]  PROGRESS NOTE  Patient Care Team: Allwardt, Randa Evens, PA-C as PCP - General (Physician Assistant) Milus Banister, MD as Attending Physician (Gastroenterology) Dr. Darylene Price, MD as Consulting Physician (Orthopedic Surgery) Latanya Maudlin, MD as Consulting Physician (Orthopedic Surgery) Kathrynn Ducking, MD as Consulting Physician (Neurology) Okey Regal, [place] as Consulting Physician (Optometry) [name], [facility] as Pharmacist (Pharmacist) Cameron Sprang, MD as Consulting Physician (Neurology) Valrie Hart, RN as Oncology Nurse Navigator (Oncology) Leona Singleton, RN as [place] History # Small Cell Lung Cancer, Extensive Stage 07/05/2020: CT abdomen for lower abdominal pain. New pulmonary nodular density noted 07/06/2020: CT chest showed 2.2 cm macrolobulated right lower lobe pulmonary nodule (favored) versus pathologically enlarged infrahilar lymph node 10/13/2020: PET CT scan performed, findings show 2 cm right lower lobe lung mass is hypermetabolic and consistent with primary lung neoplasm. Additionally found hypermetabolic 17 mm soft tissue lesion between the descending duodenum and the pancreatic head  10/19/2020: EGD to biopsy hypermetabolic lymph node. Biopsy results show small cell lung cancer 10/26/2020: establish care with Dr. Lorenso Courier  11/13/2020: Cycle 1 Day 1 of Carbo/Etop/Atezolizumab 12/04/2020: Cycle 2 Day 1 of Carbo/Etop/Atezolizumab 11/22-11/25/2022: admitted for E. Coli bacteremia/sepsis. Start of Cycle 3 delayed. 01/02/2021: Cycle 3 Day 1 of Carbo/Etop/Atezolizumab 01/23/2021: Cycle 4 Day 1 of Carbo/Etop/Atezolizumab  Interval History:  [name] 65 y.o. female with medical history significant for extensive stage small cell lung cancer who presents for a follow up visit. The patient's last visit was on 01/02/2021.  In the interim since the last visit she has completed cycle 3 of chemotherapy. She presents today to start cycle 4 of chemotherapy.  On exam today [name] is seen in infusion.  Today she reports she does have some occasional episodes of feeling queasy but this responds quite well to Zofran.  She sometimes doubles up and takes 2 with Zofran 8 mg at the same time.  She reports that this is effective and she rarely has to do this more than once.  She does endorse continued fatigue but otherwise is tolerating treatment quite well.  She denies any diarrhea or vomiting.  She denies any bleeding, bruising, or dark stools.  She has no signs or symptoms concerning for recurrent infection of her eye or urinary tract infection.  She currently denies any fevers, chills, sweats, vomiting or diarrhea.  A full 10 point ROS is listed below.   MEDICAL HISTORY:  Past Medical History:  Diagnosis Date   Allergic rhinitis    Anemia    Anxiety    Chicken pox    Chronic back pain    COPD (chronic obstructive pulmonary disease) (HCC)    Depression    DM (diabetes mellitus) ([place])    Essential hypertension    GERD (gastroesophageal reflux disease)    Headache    migraines   History of gastritis    EGD 2015   History of home oxygen therapy    2 liters at hs last 6 months   Hyperlipidemia    Hypothyroidism    Migraines    Osteoarthritis    oa   Scoliosis     SURGICAL HISTORY: Past Surgical History:  Procedure Laterality Date   APPENDECTOMY     1985   BIOPSY  07/24/2017   Procedure: BIOPSY;  Surgeon: Milus Banister, MD;  Location: [facility];  Service: Endoscopy;;   CARDIAC  CATHETERIZATION N/A 10/31/2015   Procedure: Left Heart Cath and Coronary Angiography;  Surgeon: Leonie Man, MD;  Location: [facility];  Service: Cardiovascular;  Laterality: N/A;   CARPAL TUNNEL RELEASE Left    CARPAL TUNNEL RELEASE Right    CHOLECYSTECTOMY  late 1980's   COLONOSCOPY WITH PROPOFOL N/A 07/24/2017    Procedure: COLONOSCOPY WITH PROPOFOL;  Surgeon: Milus Banister, MD;  [facility]: [facility];  Service: Endoscopy;  Laterality: N/A;   ESOPHAGOGASTRODUODENOSCOPY N/A 07/24/2017   Procedure: ESOPHAGOGASTRODUODENOSCOPY (EGD);  Surgeon: Milus Banister, MD;  Location: [facility];  Service: Endoscopy;  Laterality: N/A;   ESOPHAGOGASTRODUODENOSCOPY (EGD) WITH PROPOFOL N/A 10/19/2020   Procedure: ESOPHAGOGASTRODUODENOSCOPY (EGD) WITH PROPOFOL;  Surgeon: Milus Banister, MD;  [facility];  Service: Endoscopy;  Laterality: N/A;   FINE NEEDLE ASPIRATION N/A 10/19/2020   Procedure: FINE NEEDLE ASPIRATION (FNA) LINEAR;  Surgeon: Milus Banister, MD;  [facility];  Service: Endoscopy;  Laterality: N/A;   GALLBLADDER SURGERY  1991   HIP CLOSED REDUCTION Right 01/08/2016   Procedure: CLOSED MANIPULATION HIP;  Surgeon: Susa Day, MD;  [facility];  Service: Orthopedics;  Laterality: Right;   HIP CLOSED REDUCTION Right 01/19/2016   Procedure: ATTEMPTED CLOSED REDUCTION RIGHT HIP;  Surgeon: Wylene Simmer, MD;  [facility]: [facility];  Service: Orthopedics;  Laterality: Right;   HIP CLOSED REDUCTION Right 01/20/2016   Procedure: CLOSED REDUCTION RIGHT TOTAL HIP;  Surgeon: Paralee Cancel, MD;  [facility]: [facility];  Service: Orthopedics;  Laterality: Right;   HIP CLOSED REDUCTION Right 02/17/2016   Procedure: CLOSED REDUCTION RIGHT TOTAL HIP;  Surgeon: Rod Can, MD;  Location: [facility];  Service: Orthopedics;  Laterality: Right;   HIP CLOSED REDUCTION Right 02/28/2016   Procedure: CLOSED REDUCTION HIP;  Surgeon: Nicholes Stairs, MD;  [facility]: [facility];  Service: Orthopedics;  Laterality: Right;   IR IMAGING GUIDED PORT INSERTION  11/01/2020   POLYPECTOMY  07/24/2017   Procedure: POLYPECTOMY;  Surgeon: Milus Banister, MD;  Location: [facility];  Service: Endoscopy;;   [place], with 1 ovary removed and 2 nd ovary removed 2003   TOTAL  HIP ARTHROPLASTY Right    Original surgery 2006 with revision 2010   TOTAL HIP REVISION Right 01/01/2016   Procedure: TOTAL HIP REVISION;  Surgeon: Paralee Cancel, MD;  [facility];  Service: Orthopedics;  Laterality: Right;   TOTAL HIP REVISION Right 03/02/2016   Procedure: TOTAL HIP REVISION;  Surgeon: Paralee Cancel, MD;  [facility];  Service: Orthopedics;  Laterality: Right;   TOTAL HIP REVISION Right 09/02/2016   Procedure: Right hip constrained liner- posterior;  Surgeon: Paralee Cancel, MD;  [facility];  Service: Orthopedics;  Laterality: Right;   ULNAR NERVE TRANSPOSITION Right    UPPER ESOPHAGEAL ENDOSCOPIC ULTRASOUND (EUS) N/A 10/19/2020   Procedure: UPPER ESOPHAGEAL ENDOSCOPIC ULTRASOUND (EUS);  Surgeon: Milus Banister, MD;  Location: [facility];  Service: Endoscopy;  Laterality: N/A;  periduodenal lesion    SOCIAL HISTORY: Social History   Socioeconomic History   Marital status: Married    Spouse name: Not on file   Number of children: 2   Years of education: Not on file   Highest education level: Not on file  Occupational History   Occupation: disabled   Occupation: disabled  Tobacco Use   Smoking status: Every Day    Packs/day: 2.00    Years: 46.00  Pack years: 92.00    Types: Cigarettes   Smokeless tobacco: Never   Tobacco comments:    Currently smoking 1.5ppd as of 11/10/20  Vaping Use   Vaping Use: Never used  Substance and Sexual Activity   Alcohol use: No   Drug use: No   Sexual activity: Not Currently    Partners: Male  Other Topics Concern   Not on file  Social History Narrative   Right handed    Caffeine~ 2 cups per day    Lives at home with husband (strained relationship)   Primary caretaker for disabled brother who had aneurism   Daughter died 06-10-18    Social Determinants of Radio broadcast assistant Strain: Not on file  Food Insecurity: Not on file  Transportation Needs: Not on file  Physical Activity: Not on file   Stress: Not on file  Social Connections: Not on file  Intimate Partner Violence: Not on file    FAMILY HISTORY: Family History  Problem Relation Age of Onset   COPD Mother    Heart disease Mother    Lung disease Father        Asbestosis   Heart attack Father    Heart disease Father    Cerebral aneurysm Brother    Aneurysm Brother        Brain   Drug abuse Daughter    Epilepsy Son    Alcohol abuse Son    Drug abuse Son    Arthritis Maternal Grandmother    Heart disease Maternal Grandmother    Asthma Maternal Grandfather    Cancer Maternal Grandfather    Arthritis Paternal Grandmother    Heart disease Paternal Grandmother    Stroke Paternal Grandmother    Early death Paternal Grandfather    Heart disease Paternal Grandfather     ALLERGIES:  is allergic to metformin and related, nsaids, aleve [naproxen sodium], codeine, penicillins, and sulfonamide derivatives.  MEDICATIONS:  Current Outpatient Medications  Medication Sig Dispense Refill   albuterol (PROAIR HFA) 108 (90 Base) MCG/ACT inhaler 2 puffs every 4 hours as needed only  if your can't catch your breath 18 g 3   ALPRAZolam (XANAX) 1 MG tablet Take 1 tablet po TID as needed for anxiety. 90 tablet 2   aspirin EC 81 MG tablet Take 1 tablet (81 mg total) by mouth daily. Swallow whole. 90 tablet 3   atorvastatin (LIPITOR) 20 MG tablet Take 1 tablet (20 mg total) by mouth at bedtime. 90 tablet 1   cyclobenzaprine (FLEXERIL) 10 MG tablet Take 1 tablet (10 mg total) by mouth 3 (three) times daily as needed for muscle spasms. 90 tablet 5   DULoxetine (CYMBALTA) 60 MG capsule TAKE TWO CAPSULES BY MOUTH DAILY 60 capsule 5   fluticasone (FLONASE) 50 MCG/ACT nasal spray Place 2 sprays into both nostrils daily. 16 g 6   Fluticasone-Umeclidin-Vilant (TRELEGY ELLIPTA) 100-62.5-25 MCG/ACT AEPB Inhale 1 puff into the lungs daily. 1 each 0   Fluticasone-Umeclidin-Vilant (TRELEGY ELLIPTA) 200-62.5-25 MCG/INH AEPB Inhale 1 puff into the  lungs daily. 60 each 5   furosemide (LASIX) 20 MG tablet TAKE 1 TABLET BY MOUTH EVERY DAY AS NEEDED 30 tablet 90   glucose blood test strip Use as instructed 300 each 11   HYDROcodone-acetaminophen (NORCO) 10-325 MG tablet TAKE 1 TABLET BY MOUTH EVERY 6 HOURS AS NEEDED 120 tablet 0   levothyroxine (SYNTHROID) 125 MCG tablet Take 1 tablet (125 mcg total) by mouth daily before breakfast. 30 tablet  0   Lidocaine 4 % PTCH Place 1 patch onto the skin daily as needed (pain.).     loratadine (CLARITIN) 10 MG tablet Take 10 mg by mouth daily.     omeprazole (PRILOSEC) 40 MG capsule TAKE ONE CAPSULE BY MOUTH TWICE DAILY 60 capsule 2   ondansetron (ZOFRAN) 8 MG tablet Take 2 tablets (16 mg total) by mouth 2 (two) times daily as needed. 60 tablet 0   OXYGEN Inhale 3 L into the lungs at bedtime. Uses At Night     potassium chloride (KLOR-CON) 10 MEQ tablet Take 1 tablet (10 mEq total) by mouth at bedtime. 90 tablet 1   prochlorperazine (COMPAZINE) 10 MG tablet Take 1 tablet (10 mg total) by mouth every 6 (six) hours as needed for nausea or vomiting. 30 tablet 0   triamterene-hydrochlorothiazide (MAXZIDE-25) 37.5-25 MG tablet Take 1 tablet by mouth daily. 90 tablet 1   No current facility-administered medications for this visit.   Facility-Administered Medications Ordered in Other Visits  Medication Dose Route Frequency Provider Last Rate Last Admin   atezolizumab (TECENTRIQ) 1,200 mg in sodium chloride 0.9 % 250 mL chemo infusion  1,200 mg Intravenous Once Orson Slick, MD       CARBOplatin (PARAPLATIN) 650 mg in sodium chloride 0.9 % 250 mL chemo infusion  650 mg Intravenous Once Ledell Peoples IV, MD       etoposide (VEPESID) 200 mg in sodium chloride 0.9 % 500 mL chemo infusion  100 mg/m2 (Treatment Plan Recorded) Intravenous Once Orson Slick, MD        REVIEW OF SYSTEMS:   Constitutional: ( - ) fevers, ( - )  chills , ( - ) night sweats Eyes: ( - ) blurriness of vision, ( - ) double  vision, ( - ) watery eyes Ears, nose, mouth, throat, and face: ( - ) mucositis, ( - ) sore throat Respiratory: ( - ) cough, ( - ) dyspnea, ( - ) wheezes Cardiovascular: ( - ) palpitation, ( - ) chest discomfort, ( - ) lower extremity swelling Gastrointestinal:  ( - ) nausea, ( - ) heartburn, ( - ) change in bowel habits Skin: ( - ) abnormal skin rashes Lymphatics: ( - ) new lymphadenopathy, ( - ) easy bruising Neurological: ( - ) numbness, ( - ) tingling, ( - ) new weaknesses Behavioral/Psych: ( - ) mood change, ( - ) new changes  All other systems were reviewed with the patient and are negative.  PHYSICAL EXAMINATION: ECOG PERFORMANCE STATUS: 1 - Symptomatic but completely ambulatory  There were no vitals filed for this visit.  There were no vitals filed for this visit.   GENERAL: Well-appearing middle-age Caucasian female, alert, no distress and comfortable SKIN: skin color, texture, turgor are normal, no rashes or significant lesions EYES: conjunctiva are pink and non-injected, sclera clear LUNGS: clear to auscultation and percussion with normal breathing effort HEART: regular rate & rhythm and no murmurs and no lower extremity edema Musculoskeletal: no cyanosis of digits and no clubbing  PSYCH: alert & oriented x 3, fluent speech NEURO: no focal motor/sensory deficits  LABORATORY DATA:  I have reviewed the data as listed CBC Latest Ref Rng & Units 01/23/2021 01/02/2021 12/21/2020  WBC 4.0 - 10.5 K/uL 9.9 10.3 12.6(H)  Hemoglobin 12.0 - 15.0 g/dL 9.6(L) 10.0(L) 9.1(L)  Hematocrit 36.0 - 46.0 % 30.6(L) 32.3(L) 27.6(L)  Platelets 150 - 400 K/uL 244 264 36(L)    CMP Latest Ref  Rng & Units 01/23/2021 01/02/2021 12/22/2020  Glucose 70 - 99 mg/dL 91 89 113(H)  BUN 8 - 23 mg/dL 8 9 8   Creatinine 0.44 - 1.00 mg/dL 0.82 0.85 0.78  Sodium 135 - 145 mmol/L 138 141 135  Potassium 3.5 - 5.1 mmol/L 3.9 3.7 5.0  Chloride 98 - 111 mmol/L 104 106 103  CO2 22 - 32 mmol/L 26 23 24   Calcium  8.9 - 10.3 mg/dL 8.7(L) 8.6(L) 8.4(L)  Total Protein 6.5 - 8.1 g/dL 6.2(L) 6.4(L) 5.5(L)  Total Bilirubin 0.3 - 1.2 mg/dL 0.4 0.4 0.5  Alkaline Phos 38 - 126 U/L 107 96 125  AST 15 - 41 U/L 15 22 27   ALT 0 - 44 U/L 11 17 25     No results found for: MPROTEIN Lab Results  Component Value Date   KPAFRELGTCHN 0.75 06/30/2014   LAMBDASER 3.78 (H) 06/30/2014   KAPLAMBRATIO 0.20 (L) 06/30/2014     RADIOGRAPHIC STUDIES: CT CHEST ABDOMEN PELVIS W CONTRAST  Result Date: 01/18/2021 CLINICAL DATA:  Small cell lung cancer, diagnosed September 2022, chemotherapy ongoing EXAM: CT CHEST, ABDOMEN, AND PELVIS WITH CONTRAST TECHNIQUE: Multidetector CT imaging of the chest, abdomen and pelvis was performed following the standard protocol during bolus administration of intravenous contrast. CONTRAST:  15mL OMNIPAQUE IOHEXOL 350 MG/ML SOLN COMPARISON:  CTA chest dated 12/19/2020.  PET-CT dated 10/13/2020. FINDINGS: CT CHEST FINDINGS Cardiovascular: Heart is normal in size.  No pericardial effusion. No evidence of thoracic aortic aneurysm. Atherosclerotic calcifications of the aortic arch. Coronary atherosclerosis of the LAD. Right chest port terminates in the upper right atrium. Mediastinum/Nodes: No suspicious mediastinal lymphadenopathy. Visualized thyroid is unremarkable. Lungs/Pleura: Mild centrilobular and paraseptal emphysematous changes, upper lung predominant. Complete resolution of prior central right lower lobe mass. No suspicious pulmonary nodules. No focal consolidation. No pleural effusion or pneumothorax. Musculoskeletal: Mild degenerative changes of the lower thoracic spine. CT ABDOMEN PELVIS FINDINGS Hepatobiliary: Mildly nodular hepatic contour. No focal hepatic lesion is seen. Status post cholecystectomy. No intrahepatic ductal dilatation. Mildly dilated common duct, measuring 12 mm and smoothly tapering at the ampulla, likely postsurgical. Pancreas: Within normal limits. Spleen: Within normal  limits. Adrenals/Urinary Tract: Adrenal glands are within normal limits. Kidneys are within normal limits.  No hydronephrosis. Bladder is partially obscured by streak artifact but grossly unremarkable. Stomach/Bowel: Stomach is within normal limits. No evidence of bowel obstruction. Appendix is not discretely visualized. Moderate colonic stool burden, suggesting mild constipation. No colonic wall thickening or inflammatory changes. Vascular/Lymphatic: No evidence of abdominal aortic aneurysm. Atherosclerotic calcifications of the abdominal aorta and branch vessels. No suspicious abdominopelvic lymphadenopathy. Mild persistent soft tissue adjacent to the pancreaticoduodenal groove (series 2/image 60) is not considered suspicious on CT, although this may correspond to the focal hypermetabolism on prior PET. Reproductive: Status post hysterectomy. No adnexal masses. Other: No abdominopelvic ascites. Musculoskeletal: Degenerative changes of the lumbar spine. Right hip arthroplasty, without evidence of complication. Mild degenerative changes of the left hip. IMPRESSION: Prior right lower lobe mass has resolved. No findings specific for recurrent or metastatic disease. Mild residual soft tissue along the pancreaticoduodenal groove is not considered suspicious on CT, although this may correspond to the focal hypermetabolism on prior PET. Attention on follow-up is suggested. Electronically Signed   By: Julian Hy M.D.   On: 01/18/2021 10:39    ASSESSMENT & PLAN RASHIDA LADOUCEUR 65 y.o. female with medical history significant for extensive stage small cell lung cancer who presents for a follow up visit.   After review  of the labs, review of the records, and discussion with the patient the patients findings are most consistent with extensive stage small cell lung cancer with metastasis from the right lower lobe to the lymph nodes of the abdomen.  At this time we will pursue triple therapy with carboplatin,  etoposide, and atezolizumab.  After 4 cycles we will convert to maintenance atezolizumab alone.  We previously discussed the risks and benefits of this therapy and the patient was in agreement to proceed with this treatment.  The treatment of choice consist of carboplatin, etoposide, and atezolizumab.  The regimen consists of carboplatin AUC of 5 IV on day 1, etoposide 100 mg per metered squared IV on day 1, 2, and 3 and atezolizumab 1200 mg on day 1.  This continues for 21-day cycles.  After 4 cycles the patient proceeds with atezolizumab maintenance therapy alone.   #Small Cell Lung Cancer, Extensive Stage -- MRI of the brain shows no evidence of intracranial spread --Findings are currently consistent with metastatic small cell lung cancer with metastatic spread to the lymph nodes of the abdomen --Prognosis is poor with anticipated life span of less than 12 months -- Patient has extensive stage due to metastatic spread to lymph nodes in the abdomen --Plan to proceed with carboplatin, etoposide, and atezolizumab --will consider prophylactic cranial radiation after start of chemotherapy.  Plan: --today is Cycle 4 Day 1 of Carbo/Etop/Atezolizumab --CT scan q 3 months. This will be due in March 2023. Last scan in Dec 2022 showed an excellent response to therapy.  --GCSF shot on Day 3 --RTC in 3 weeks for next cycle of treatment. At that time transition to maintenance atezolizumab   #Leukocytosis, resolved  #Left Upper Eyelid Infection, resolved   --continue to monitor  --WBC 9.9 today.    #Supportive Care -- chemotherapy education complete -- port placed -- zofran 8mg  q8H PRN and compazine 10mg  PO q6H for nausea -- EMLA cream for port -- no pain medication required at this time.   No orders of the defined types were placed in this encounter.   All questions were answered. The patient knows to call the clinic with any problems, questions or concerns.  A total of more than 30 minutes  were spent on this encounter with face-to-face time and non-face-to-face time, including preparing to see the patient, ordering tests and/or medications, counseling the patient and coordination of care as outlined above.   Ledell Peoples, MD Department of Hematology/Oncology [place] [facility] Phone: [phone] Pager: [phone] Email: [email or web address]  01/23/2021 1:56 PM

## 2021-01-23 NOTE — Patient Instructions (Signed)
[facility]COL[facility]  Discharge Instructions: Thank you for choosing [facility] to provide your oncology and hematology care.   If you have a lab appointment with the [facility], please go directly to the [facility] and check in at the registration area.   Wear comfortable clothing and clothing appropriate for easy access to any Portacath or PICC line.   We strive to give you quality time with your provider. You may need to reschedule your appointment if you arrive late (15 or more minutes).  Arriving late affects you and other patients whose appointments are after yours.  Also, if you miss three or more appointments without notifying the office, you may be dismissed from the clinic at the providers discretion.      For prescription refill requests, have your pharmacy contact our office and allow 72 hours for refills to be completed.    Today you received the following chemotherapy and/or immunotherapy agents Tecentriq, Carboplatin, and etoposide      To help prevent nausea and vomiting after your treatment, we encourage you to take your nausea medication as directed.  BELOW ARE SYMPTOMS THAT SHOULD BE REPORTED IMMEDIATELY: *FEVER GREATER THAN 100.4 F (38 C) OR HIGHER *CHILLS OR SWEATING *NAUSEA AND VOMITING THAT IS NOT CONTROLLED WITH YOUR NAUSEA MEDICATION *UNUSUAL SHORTNESS OF BREATH *UNUSUAL BRUISING OR BLEEDING *URINARY PROBLEMS (pain or burning when urinating, or frequent urination) *BOWEL PROBLEMS (unusual diarrhea, constipation, pain near the anus) TENDERNESS IN MOUTH AND THROAT WITH OR WITHOUT PRESENCE OF ULCERS (sore throat, sores in mouth, or a toothache) UNUSUAL RASH, SWELLING OR PAIN  UNUSUAL VAGINAL DISCHARGE OR ITCHING   Items with * indicate a potential emergency and should be followed up as soon as possible or go to the Emergency Department if any problems should occur.  Please show the CHEMOTHERAPY ALERT CARD or IMMUNOTHERAPY  ALERT CARD at check-in to the Emergency Department and triage nurse.  Should you have questions after your visit or need to cancel or reschedule your appointment, please contact [facility]  Dept: [phone]  and follow the prompts.  Office hours are 8:00 a.m. to 4:30 p.m. Monday - Friday. Please note that voicemails left after 4:00 p.m. may not be returned until the following business day.  We are closed weekends and major holidays. You have access to a nurse at all times for urgent questions. Please call the main number to the clinic Dept: [phone] and follow the prompts.   For any non-urgent questions, you may also contact your provider using MyChart. We now offer e-Visits for anyone 22 and older to request care online for non-urgent symptoms. For details visit [email or web address].   Also download the MyChart app! Go to the app store, search "MyChart", open the app, select [facility], and log in with your MyChart username and password.  Due to Covid, a mask is required upon entering the hospital/clinic. If you do not have a mask, one will be given to you upon arrival. For doctor visits, patients may have 1 support person aged 74 or older with them. For treatment visits, patients cannot have anyone with them due to current Covid guidelines and our immunocompromised population.

## 2021-01-24 ENCOUNTER — Inpatient Hospital Stay: Payer: Medicare HMO

## 2021-01-24 VITALS — BP 114/63 | HR 99 | Temp 98.5°F | Resp 18

## 2021-01-24 DIAGNOSIS — E119 Type 2 diabetes mellitus without complications: Secondary | ICD-10-CM | POA: Diagnosis not present

## 2021-01-24 DIAGNOSIS — J439 Emphysema, unspecified: Secondary | ICD-10-CM | POA: Diagnosis not present

## 2021-01-24 DIAGNOSIS — Z823 Family history of stroke: Secondary | ICD-10-CM | POA: Diagnosis not present

## 2021-01-24 DIAGNOSIS — I700 Atherosclerosis of aorta: Secondary | ICD-10-CM | POA: Diagnosis not present

## 2021-01-24 DIAGNOSIS — I2510 Atherosclerotic heart disease of native coronary artery without angina pectoris: Secondary | ICD-10-CM | POA: Diagnosis not present

## 2021-01-24 DIAGNOSIS — Z5111 Encounter for antineoplastic chemotherapy: Secondary | ICD-10-CM | POA: Diagnosis not present

## 2021-01-24 DIAGNOSIS — Z811 Family history of alcohol abuse and dependence: Secondary | ICD-10-CM | POA: Diagnosis not present

## 2021-01-24 DIAGNOSIS — C3490 Malignant neoplasm of unspecified part of unspecified bronchus or lung: Secondary | ICD-10-CM

## 2021-01-24 DIAGNOSIS — R5383 Other fatigue: Secondary | ICD-10-CM | POA: Diagnosis not present

## 2021-01-24 DIAGNOSIS — F17210 Nicotine dependence, cigarettes, uncomplicated: Secondary | ICD-10-CM | POA: Diagnosis not present

## 2021-01-24 DIAGNOSIS — E785 Hyperlipidemia, unspecified: Secondary | ICD-10-CM | POA: Diagnosis not present

## 2021-01-24 DIAGNOSIS — Z820 Family history of epilepsy and other diseases of the nervous system: Secondary | ICD-10-CM | POA: Diagnosis not present

## 2021-01-24 DIAGNOSIS — Z814 Family history of other substance abuse and dependence: Secondary | ICD-10-CM | POA: Diagnosis not present

## 2021-01-24 DIAGNOSIS — Z5112 Encounter for antineoplastic immunotherapy: Secondary | ICD-10-CM | POA: Diagnosis not present

## 2021-01-24 DIAGNOSIS — Z5189 Encounter for other specified aftercare: Secondary | ICD-10-CM | POA: Diagnosis not present

## 2021-01-24 DIAGNOSIS — Z809 Family history of malignant neoplasm, unspecified: Secondary | ICD-10-CM | POA: Diagnosis not present

## 2021-01-24 DIAGNOSIS — D72829 Elevated white blood cell count, unspecified: Secondary | ICD-10-CM | POA: Diagnosis not present

## 2021-01-24 DIAGNOSIS — Z79899 Other long term (current) drug therapy: Secondary | ICD-10-CM | POA: Diagnosis not present

## 2021-01-24 DIAGNOSIS — C3431 Malignant neoplasm of lower lobe, right bronchus or lung: Secondary | ICD-10-CM | POA: Diagnosis not present

## 2021-01-24 DIAGNOSIS — Z836 Family history of other diseases of the respiratory system: Secondary | ICD-10-CM | POA: Diagnosis not present

## 2021-01-24 DIAGNOSIS — I10 Essential (primary) hypertension: Secondary | ICD-10-CM | POA: Diagnosis not present

## 2021-01-24 DIAGNOSIS — Z8249 Family history of ischemic heart disease and other diseases of the circulatory system: Secondary | ICD-10-CM | POA: Diagnosis not present

## 2021-01-24 MED ORDER — SODIUM CHLORIDE 0.9% FLUSH: 10.0000 mL | INTRAVENOUS | Status: DC | PRN

## 2021-01-24 MED ORDER — SODIUM CHLORIDE 0.9 % IV SOLN
100.0000 mg/m2 | Freq: Once | INTRAVENOUS | Status: AC
  Administered 2021-01-24: 09:00:00 200 mg via INTRAVENOUS
  Filled 2021-01-24: qty 10

## 2021-01-24 MED ORDER — SODIUM CHLORIDE 0.9 % IV SOLN
10.0000 mg | Freq: Once | INTRAVENOUS | Status: AC
  Administered 2021-01-24: 08:00:00 10 mg via INTRAVENOUS
  Filled 2021-01-24: qty 10

## 2021-01-24 MED ORDER — SODIUM CHLORIDE 0.9 % IV SOLN
Freq: Once | INTRAVENOUS | Status: AC
Start: 2021-01-24 — End: 2021-01-24

## 2021-01-24 MED ORDER — HEPARIN SOD (PORK) LOCK FLUSH 100 UNIT/ML IV SOLN: 500.0000 [IU] | Freq: Once | INTRAVENOUS | Status: DC | PRN

## 2021-01-25 ENCOUNTER — Other Ambulatory Visit: Payer: Self-pay

## 2021-01-25 ENCOUNTER — Inpatient Hospital Stay: Payer: Medicare HMO

## 2021-01-25 VITALS — BP 166/81 | HR 99 | Resp 18

## 2021-01-25 DIAGNOSIS — Z5189 Encounter for other specified aftercare: Secondary | ICD-10-CM | POA: Diagnosis not present

## 2021-01-25 DIAGNOSIS — C3490 Malignant neoplasm of unspecified part of unspecified bronchus or lung: Secondary | ICD-10-CM

## 2021-01-25 DIAGNOSIS — Z814 Family history of other substance abuse and dependence: Secondary | ICD-10-CM | POA: Diagnosis not present

## 2021-01-25 DIAGNOSIS — F17210 Nicotine dependence, cigarettes, uncomplicated: Secondary | ICD-10-CM | POA: Diagnosis not present

## 2021-01-25 DIAGNOSIS — E785 Hyperlipidemia, unspecified: Secondary | ICD-10-CM | POA: Diagnosis not present

## 2021-01-25 DIAGNOSIS — I2510 Atherosclerotic heart disease of native coronary artery without angina pectoris: Secondary | ICD-10-CM | POA: Diagnosis not present

## 2021-01-25 DIAGNOSIS — Z809 Family history of malignant neoplasm, unspecified: Secondary | ICD-10-CM | POA: Diagnosis not present

## 2021-01-25 DIAGNOSIS — I10 Essential (primary) hypertension: Secondary | ICD-10-CM | POA: Diagnosis not present

## 2021-01-25 DIAGNOSIS — I700 Atherosclerosis of aorta: Secondary | ICD-10-CM | POA: Diagnosis not present

## 2021-01-25 DIAGNOSIS — E119 Type 2 diabetes mellitus without complications: Secondary | ICD-10-CM | POA: Diagnosis not present

## 2021-01-25 DIAGNOSIS — Z5111 Encounter for antineoplastic chemotherapy: Secondary | ICD-10-CM | POA: Diagnosis not present

## 2021-01-25 DIAGNOSIS — Z823 Family history of stroke: Secondary | ICD-10-CM | POA: Diagnosis not present

## 2021-01-25 DIAGNOSIS — Z820 Family history of epilepsy and other diseases of the nervous system: Secondary | ICD-10-CM | POA: Diagnosis not present

## 2021-01-25 DIAGNOSIS — J439 Emphysema, unspecified: Secondary | ICD-10-CM | POA: Diagnosis not present

## 2021-01-25 DIAGNOSIS — R5383 Other fatigue: Secondary | ICD-10-CM | POA: Diagnosis not present

## 2021-01-25 DIAGNOSIS — Z811 Family history of alcohol abuse and dependence: Secondary | ICD-10-CM | POA: Diagnosis not present

## 2021-01-25 DIAGNOSIS — Z5112 Encounter for antineoplastic immunotherapy: Secondary | ICD-10-CM | POA: Diagnosis not present

## 2021-01-25 DIAGNOSIS — D72829 Elevated white blood cell count, unspecified: Secondary | ICD-10-CM | POA: Diagnosis not present

## 2021-01-25 DIAGNOSIS — R110 Nausea: Secondary | ICD-10-CM

## 2021-01-25 DIAGNOSIS — Z8249 Family history of ischemic heart disease and other diseases of the circulatory system: Secondary | ICD-10-CM | POA: Diagnosis not present

## 2021-01-25 DIAGNOSIS — Z836 Family history of other diseases of the respiratory system: Secondary | ICD-10-CM | POA: Diagnosis not present

## 2021-01-25 DIAGNOSIS — Z79899 Other long term (current) drug therapy: Secondary | ICD-10-CM | POA: Diagnosis not present

## 2021-01-25 DIAGNOSIS — C3431 Malignant neoplasm of lower lobe, right bronchus or lung: Secondary | ICD-10-CM | POA: Diagnosis not present

## 2021-01-25 MED ORDER — SODIUM CHLORIDE 0.9 % IV SOLN
100.0000 mg/m2 | Freq: Once | INTRAVENOUS | Status: AC
  Administered 2021-01-25: 09:00:00 200 mg via INTRAVENOUS
  Filled 2021-01-25: qty 10

## 2021-01-25 MED ORDER — SODIUM CHLORIDE 0.9 % IV SOLN
10.0000 mg | Freq: Once | INTRAVENOUS | Status: AC
  Administered 2021-01-25: 08:00:00 10 mg via INTRAVENOUS
  Filled 2021-01-25: qty 10

## 2021-01-25 MED ORDER — SODIUM CHLORIDE 0.9 % IV SOLN: 8.0000 mg | Freq: Once | INTRAVENOUS | Status: DC

## 2021-01-25 MED ORDER — ONDANSETRON HCL 4 MG/2ML IJ SOLN
8.0000 mg | Freq: Once | INTRAMUSCULAR | Status: AC
  Administered 2021-01-25: 08:00:00 8 mg via INTRAVENOUS

## 2021-01-25 MED ORDER — SODIUM CHLORIDE 0.9 % IV SOLN
Freq: Once | INTRAVENOUS | Status: AC
Start: 2021-01-25 — End: 2021-01-25

## 2021-01-25 NOTE — Patient Instructions (Signed)
[facility]  Discharge Instructions: Thank you for choosing [facility] to provide your oncology and hematology care.   If you have a lab appointment with the Cancer Center, please go directly to the Cancer Center and check in at the registration area.   Wear comfortable clothing and clothing appropriate for easy access to any Portacath or PICC line.   We strive to give you quality time with your provider. You may need to reschedule your appointment if you arrive late (15 or more minutes).  Arriving late affects you and other patients whose appointments are after yours.  Also, if you miss three or more appointments without notifying the office, you may be dismissed from the clinic at the provider's discretion.      For prescription refill requests, have your pharmacy contact our office and allow 72 hours for refills to be completed.    Today you received the following chemotherapy and/or immunotherapy agents: etoposide.     To help prevent nausea and vomiting after your treatment, we encourage you to take your nausea medication as directed.  BELOW ARE SYMPTOMS THAT SHOULD BE REPORTED IMMEDIATELY: . *FEVER GREATER THAN 100.4 F (38 C) OR HIGHER . *CHILLS OR SWEATING . *NAUSEA AND VOMITING THAT IS NOT CONTROLLED WITH YOUR NAUSEA MEDICATION . *UNUSUAL SHORTNESS OF BREATH . *UNUSUAL BRUISING OR BLEEDING . *URINARY PROBLEMS (pain or burning when urinating, or frequent urination) . *BOWEL PROBLEMS (unusual diarrhea, constipation, pain near the anus) . TENDERNESS IN MOUTH AND THROAT WITH OR WITHOUT PRESENCE OF ULCERS (sore throat, sores in mouth, or a toothache) . UNUSUAL RASH, SWELLING OR PAIN  . UNUSUAL VAGINAL DISCHARGE OR ITCHING   Items with * indicate a potential emergency and should be followed up as soon as possible or go to the Emergency Department if any problems should occur.  Please show the CHEMOTHERAPY ALERT CARD or IMMUNOTHERAPY ALERT  CARD at check-in to the Emergency Department and triage nurse.  Should you have questions after your visit or need to cancel or reschedule your appointment, please contact [facility]  Dept: [phone]  and follow the prompts.  Office hours are 8:00 a.m. to 4:30 p.m. Monday - Friday. Please note that voicemails left after 4:00 p.m. may not be returned until the following business day.  We are closed weekends and major holidays. You have access to a nurse at all times for urgent questions. Please call the main number to the clinic Dept: [phone] and follow the prompts.   For any non-urgent questions, you may also contact your provider using MyChart. We now offer e-Visits for anyone 18 and older to request care online for non-urgent symptoms. For details visit [email or web address].   Also download the MyChart app! Go to the app store, search "MyChart", open the app, select [facility], and log in with your MyChart username and password.  Due to Covid, a mask is required upon entering the hospital/clinic. If you do not have a mask, one will be given to you upon arrival. For doctor visits, patients may have 1 support person aged 18 or older with them. For treatment visits, patients cannot have anyone with them due to current Covid guidelines and our immunocompromised population.   

## 2021-01-27 ENCOUNTER — Other Ambulatory Visit: Payer: Self-pay

## 2021-01-27 ENCOUNTER — Inpatient Hospital Stay: Payer: Medicare HMO

## 2021-01-27 VITALS — BP 111/65 | HR 92 | Temp 98.0°F | Resp 17

## 2021-01-27 DIAGNOSIS — E785 Hyperlipidemia, unspecified: Secondary | ICD-10-CM | POA: Diagnosis not present

## 2021-01-27 DIAGNOSIS — I700 Atherosclerosis of aorta: Secondary | ICD-10-CM | POA: Diagnosis not present

## 2021-01-27 DIAGNOSIS — I10 Essential (primary) hypertension: Secondary | ICD-10-CM | POA: Diagnosis not present

## 2021-01-27 DIAGNOSIS — Z811 Family history of alcohol abuse and dependence: Secondary | ICD-10-CM | POA: Diagnosis not present

## 2021-01-27 DIAGNOSIS — Z820 Family history of epilepsy and other diseases of the nervous system: Secondary | ICD-10-CM | POA: Diagnosis not present

## 2021-01-27 DIAGNOSIS — Z8249 Family history of ischemic heart disease and other diseases of the circulatory system: Secondary | ICD-10-CM | POA: Diagnosis not present

## 2021-01-27 DIAGNOSIS — R5383 Other fatigue: Secondary | ICD-10-CM | POA: Diagnosis not present

## 2021-01-27 DIAGNOSIS — E119 Type 2 diabetes mellitus without complications: Secondary | ICD-10-CM | POA: Diagnosis not present

## 2021-01-27 DIAGNOSIS — I2510 Atherosclerotic heart disease of native coronary artery without angina pectoris: Secondary | ICD-10-CM | POA: Diagnosis not present

## 2021-01-27 DIAGNOSIS — C3490 Malignant neoplasm of unspecified part of unspecified bronchus or lung: Secondary | ICD-10-CM

## 2021-01-27 DIAGNOSIS — Z823 Family history of stroke: Secondary | ICD-10-CM | POA: Diagnosis not present

## 2021-01-27 DIAGNOSIS — C3431 Malignant neoplasm of lower lobe, right bronchus or lung: Secondary | ICD-10-CM | POA: Diagnosis not present

## 2021-01-27 DIAGNOSIS — F17210 Nicotine dependence, cigarettes, uncomplicated: Secondary | ICD-10-CM | POA: Diagnosis not present

## 2021-01-27 DIAGNOSIS — D72829 Elevated white blood cell count, unspecified: Secondary | ICD-10-CM | POA: Diagnosis not present

## 2021-01-27 DIAGNOSIS — Z814 Family history of other substance abuse and dependence: Secondary | ICD-10-CM | POA: Diagnosis not present

## 2021-01-27 DIAGNOSIS — Z809 Family history of malignant neoplasm, unspecified: Secondary | ICD-10-CM | POA: Diagnosis not present

## 2021-01-27 DIAGNOSIS — Z5111 Encounter for antineoplastic chemotherapy: Secondary | ICD-10-CM | POA: Diagnosis not present

## 2021-01-27 DIAGNOSIS — Z836 Family history of other diseases of the respiratory system: Secondary | ICD-10-CM | POA: Diagnosis not present

## 2021-01-27 DIAGNOSIS — J439 Emphysema, unspecified: Secondary | ICD-10-CM | POA: Diagnosis not present

## 2021-01-27 DIAGNOSIS — Z79899 Other long term (current) drug therapy: Secondary | ICD-10-CM | POA: Diagnosis not present

## 2021-01-27 DIAGNOSIS — Z5112 Encounter for antineoplastic immunotherapy: Secondary | ICD-10-CM | POA: Diagnosis not present

## 2021-01-27 DIAGNOSIS — Z5189 Encounter for other specified aftercare: Secondary | ICD-10-CM | POA: Diagnosis not present

## 2021-01-27 MED ORDER — PEGFILGRASTIM-CBQV 6 MG/0.6ML ~~LOC~~ SOSY
6.0000 mg | PREFILLED_SYRINGE | Freq: Once | SUBCUTANEOUS | Status: AC
  Administered 2021-01-27: 6 mg via SUBCUTANEOUS
  Filled 2021-01-27: qty 0.6

## 2021-01-27 NOTE — Patient Instructions (Signed)

## 2021-01-29 ENCOUNTER — Other Ambulatory Visit: Payer: Self-pay | Admitting: Physical Medicine and Rehabilitation

## 2021-01-29 DIAGNOSIS — G894 Chronic pain syndrome: Secondary | ICD-10-CM

## 2021-01-30 ENCOUNTER — Telehealth: Payer: Self-pay | Admitting: Physician Assistant

## 2021-01-30 DIAGNOSIS — Z96641 Presence of right artificial hip joint: Secondary | ICD-10-CM | POA: Diagnosis not present

## 2021-01-30 DIAGNOSIS — M25571 Pain in right ankle and joints of right foot: Secondary | ICD-10-CM | POA: Diagnosis not present

## 2021-01-30 NOTE — Telephone Encounter (Signed)
pt fell in floor yesterday when she tried to walk when she didnt realise her foot had fell asleep, she said her ankle is swollen and hurts to have pressure on it. she wanted to see if she could see Alyssa today but she didn't have anything, I offered for something tomorrow since all of our offices are full or recommended an urgent care, she said that she is going to call her orthopedic provider and see if they can get her in

## 2021-01-30 NOTE — Telephone Encounter (Signed)
Patient has a appt with Ortho today @ 4:15

## 2021-02-01 ENCOUNTER — Ambulatory Visit: Payer: Medicare HMO | Admitting: *Deleted

## 2021-02-01 DIAGNOSIS — C3490 Malignant neoplasm of unspecified part of unspecified bronchus or lung: Secondary | ICD-10-CM

## 2021-02-01 DIAGNOSIS — E1140 Type 2 diabetes mellitus with diabetic neuropathy, unspecified: Secondary | ICD-10-CM

## 2021-02-01 NOTE — Chronic Care Management (AMB) (Signed)
°  Care Management   Follow Up Note   02/01/2021 Name: [name] MRN: [id number] DOB: [birth year]   Referred by: Allwardt, Randa Evens, PA-C Reason for referral : Chronic Care Management (INITIAL)   Successful contact was made with patient to discuss care management and care coordination services. Patient wishes to consider and/or discuss with PCP prior to consenting to or declining services.  Patient has completed chemotherapy and getting ready to start immunotherapy every 21 days.  Currently does not feeling like CCM services is needed but will consider at follow up phone call.  Follow Up Plan: The care management team will reach out to the patient again over the next 30 days.   Hubert Azure RN, MSN RN Care Management Coordinator  [place] [phone] [email or web address]

## 2021-02-02 ENCOUNTER — Other Ambulatory Visit: Payer: Self-pay | Admitting: Physician Assistant

## 2021-02-02 ENCOUNTER — Telehealth: Payer: Self-pay | Admitting: Pulmonary Disease

## 2021-02-02 DIAGNOSIS — E785 Hyperlipidemia, unspecified: Secondary | ICD-10-CM

## 2021-02-05 ENCOUNTER — Other Ambulatory Visit: Payer: Self-pay

## 2021-02-05 MED ORDER — TRELEGY ELLIPTA 200-62.5-25 MCG/ACT IN AEPB: 1.0000 | INHALATION_SPRAY | Freq: Every day | RESPIRATORY_TRACT | 11 refills | Status: DC

## 2021-02-05 NOTE — Telephone Encounter (Signed)
Notified pt that script had be printed and cover sheet attached but that Dr. Loanne Drilling will not return to office until 02/12/21. Pt stated understanding. Nothing further needed at this time.

## 2021-02-09 ENCOUNTER — Telehealth: Payer: Self-pay | Admitting: Pulmonary Disease

## 2021-02-09 ENCOUNTER — Telehealth: Payer: Self-pay | Admitting: *Deleted

## 2021-02-09 MED ORDER — TRELEGY ELLIPTA 200-62.5-25 MCG/ACT IN AEPB: 1.0000 | INHALATION_SPRAY | Freq: Every day | RESPIRATORY_TRACT | 11 refills | Status: DC

## 2021-02-09 NOTE — Telephone Encounter (Signed)
Received vm message from pt stating that her husband has tested + for Covid. She is asking what she should do regarding her appt on Monday 02/12/21. TCT patient. Spoke with her  Asked pt if she has been tested for Covid. She states she did home test this morning and I negative, Her husband tested + this morning. Advised to keep her husband isolated as much as possible, wear a mask around him. Advised to do Covid test on herself on Sunday evening. If it is positive, advised her to call me Monday morning. If negative, advised that she can come in , wearing a mask as usual.  She voiced understanding..  Dr. Lorenso Courier. Made aware.

## 2021-02-09 NOTE — Telephone Encounter (Signed)
RX has been printed to have Dr. Loanne Drilling sign when she returns on Monday. Placed it in her cabinet in C pod.

## 2021-02-11 ENCOUNTER — Encounter: Payer: Self-pay | Admitting: Hematology and Oncology

## 2021-02-12 ENCOUNTER — Other Ambulatory Visit: Payer: Self-pay | Admitting: Hematology and Oncology

## 2021-02-12 ENCOUNTER — Inpatient Hospital Stay: Payer: No Typology Code available for payment source | Admitting: Hematology and Oncology

## 2021-02-12 ENCOUNTER — Inpatient Hospital Stay: Payer: No Typology Code available for payment source

## 2021-02-12 ENCOUNTER — Telehealth: Payer: Self-pay | Admitting: *Deleted

## 2021-02-12 ENCOUNTER — Telehealth: Payer: Self-pay | Admitting: Physician Assistant

## 2021-02-12 ENCOUNTER — Telehealth: Payer: No Typology Code available for payment source | Admitting: Physician Assistant

## 2021-02-12 ENCOUNTER — Other Ambulatory Visit: Payer: Self-pay | Admitting: *Deleted

## 2021-02-12 MED ORDER — NIRMATRELVIR/RITONAVIR (PAXLOVID)TABLET: 3.0000 | ORAL_TABLET | Freq: Two times a day (BID) | ORAL | 0 refills | Status: DC

## 2021-02-12 MED ORDER — BENZONATATE 100 MG PO CAPS: 100.0000 mg | ORAL_CAPSULE | Freq: Three times a day (TID) | ORAL | 1 refills | Status: DC

## 2021-02-12 NOTE — Telephone Encounter (Signed)
Pt called in and canceled appt with our office due to scheduling with oncology.  Patient Name: [name] Gender: Female DOB: 08-21-1955 Age: 66 Y 14 D Return Phone Number: [phone] (Primary) Address: City/ State/ Zip: [place] [address] Client [facility] [place] Client Site [place] [facility] [place] Night Provider Orma Flaming- MD Contact Type Call Who Is Calling Patient / Member / Family / Caregiver Call Type Triage / Clinical Relationship To Patient Self Return Phone Number [phone] (Primary) Chief Complaint Cough Reason for Call Symptomatic / Request for Health Information Initial Comment Caller states she is COVID positive and she wants to know if she can get medication. Caller states she is having a headache that wont stop, coughing, stomach hurts, and her body hurts. [place] Translation No Nurse Assessment Nurse: Altamease Oiler, RN, Adriana Date/Time (Eastern Time): 02/12/2021 8:52:57 AM Confirm and document reason for call. If symptomatic, describe symptoms. ---pt is covid +. sx include, headache, fatigue, nausea, hoarseness, cough, possible fevers, states she wakes up soaked. also reports sneezing and body aches. sx onset thursday. stage IV lung cancer. pt just finished chemo recently (21 days ago) Does the patient have any new or worsening symptoms? ---Yes Will a triage be completed? ---Yes Related visit to physician within the last 2 weeks? ---No Does the PT have any chronic conditions? (i.e. diabetes, asthma, this includes High risk factors for pregnancy, etc.) ---Yes List chronic conditions. ---lung cancer htn high cholesterol Is this a behavioral health or substance abuse call? ---No Guidelines Guideline Title Affirmed Question Affirmed Notes Nurse Date/Time ([place] Time) COVID-19 - Diagnosed or Suspected Chest pain or pressure (Exception: MILD central  chest Sarajane Marek 02/12/2021 8:59:07 AM  Guidelines Guideline Title Affirmed Question Affirmed Notes Nurse Date/Time Eilene Ghazi Time) pain, present only when coughing) Disp. Time Eilene Ghazi Time) Disposition Final User 02/12/2021 9:05:28 AM Go to ED Now Yes Altamease Oiler, RN, Adriana Disposition Overriden: Go to ED Now (or PCP triage) Override Reason: Patients symptoms need a higher level of care Caller Disagree/Comply Comply Caller Understands Yes PreDisposition Call Doctor Care Advice Given Per Guideline GO TO ED NOW: * You need to be seen in the Emergency Department. Referrals GO TO FACILITY OTHER - SPECIFY

## 2021-02-12 NOTE — Telephone Encounter (Signed)
Noted and agreed, she needs eval in ER.

## 2021-02-12 NOTE — Telephone Encounter (Signed)
Received call from pt this morning. She has tested + for Covid last night (02/11/21) Advised we will need to move her appts out 10 days. Explained Covid + protocol to pt. She voiced understanding. Pt states she does not feel well. C/o headache, queasiness, muscle aches all over , sneezing and ongoing fatigue. Denies fever.  She states she called her PCP. Her PCP wants her to go to the ED. Pt states she does not want to go-she can't afford more bills.  Discussed with Dr. Lorenso Courier. He will order the Paxlovid for her. Pt made aware of this medication being sent in to her pharmacy. Advised that if she develops SOB, worse coughing, high fevers, she will need to go to the ED. Uniqua voiced understanding

## 2021-02-13 ENCOUNTER — Telehealth: Payer: Self-pay | Admitting: Hematology and Oncology

## 2021-02-13 ENCOUNTER — Telehealth (INDEPENDENT_AMBULATORY_CARE_PROVIDER_SITE_OTHER): Payer: No Typology Code available for payment source | Admitting: Family Medicine

## 2021-02-13 DIAGNOSIS — U071 COVID-19: Secondary | ICD-10-CM | POA: Diagnosis not present

## 2021-02-13 MED ORDER — MOLNUPIRAVIR EUA 200MG CAPSULE: 4.0000 | ORAL_CAPSULE | Freq: Two times a day (BID) | ORAL | 0 refills | Status: AC

## 2021-02-13 NOTE — Progress Notes (Signed)
Virtual Visit via Telephone Note  I connected with [name] on 02/13/21 at  5:00 PM EST by telephone and verified that I am speaking with the correct person using two identifiers.   I discussed the limitations of performing an evaluation and management service by telephone and requested permission for a phone visit. The patient expressed understanding and agreed to proceed.  Location patient:  [place] Location provider: work or home office Participants present for the call: patient, provider Patient did not have a visit with me in the prior 7 days to address this/these issue(s).   History of Present Illness:  Acute telemedicine visit for Covid19: -Onset: 3 days; tested positive for covid today -husband is positive for covid as well -Symptoms include: nasal congestion, cough, feels hot and cold, feels tired, has upset stomach -reports her oncologist sent her in paxlovid, but the pharmacy has refused to fill it as it interacts with her benzos and hydrocodone that she takes consistently -Denies: SOB, CP, vomiting, diarrhea, inability to eat/drink/get out of bed -Pertinent past medical history: see below -Pertinent medication allergies:  Allergies  Allergen Reactions   Metformin And Related Diarrhea   Nsaids Diarrhea   Aleve [Naproxen Sodium] Other (See Comments)    Headache    Codeine Nausea Only and Other (See Comments)    GI upset   Penicillins Nausea Only and Other (See Comments)    GI upset    Sulfonamide Derivatives Hives  -COVID-19 vaccine status: Immunization History  Administered Date(s) Administered   Influenza,inj,Quad PF,6+ Mos 10/02/2018, 11/30/2019, 11/15/2020   Influenza-Unspecified 01/16/2011, 11/26/2011, 12/22/2012, 11/17/2018   PFIZER Comirnaty(Gray Top)Covid-19 Tri-Sucrose Vaccine 05/17/2020   PFIZER(Purple Top)SARS-COV-2 Vaccination 10/20/2019, 11/10/2019   PNEUMOCOCCAL CONJUGATE-20 09/04/2020   Td 06/07/2005      Past Medical History:  Diagnosis Date    Allergic rhinitis    Anemia    Anxiety    Chicken pox    Chronic back pain    COPD (chronic obstructive pulmonary disease) (HCC)    Depression    DM (diabetes mellitus) ([place])    Essential hypertension    GERD (gastroesophageal reflux disease)    Headache    migraines   History of gastritis    EGD 2015   History of home oxygen therapy    2 liters at hs last 6 months   Hyperlipidemia    Hypothyroidism    Migraines    Osteoarthritis    oa   Scoliosis     Current Outpatient Medications on File Prior to Visit  Medication Sig Dispense Refill   albuterol (PROAIR HFA) 108 (90 Base) MCG/ACT inhaler 2 puffs every 4 hours as needed only  if your can't catch your breath 18 g 3   ALPRAZolam (XANAX) 1 MG tablet Take 1 tablet po TID as needed for anxiety. 90 tablet 2   aspirin EC 81 MG tablet Take 1 tablet (81 mg total) by mouth daily. Swallow whole. 90 tablet 3   atorvastatin (LIPITOR) 20 MG tablet TAKE 1 TABLET BY MOUTH AT BEDTIME 90 tablet 1   benzonatate (TESSALON) 100 MG capsule Take 1 capsule (100 mg total) by mouth 3 (three) times daily. 30 capsule 1   cyclobenzaprine (FLEXERIL) 10 MG tablet Take 1 tablet (10 mg total) by mouth 3 (three) times daily as needed for muscle spasms. 90 tablet 5   DULoxetine (CYMBALTA) 60 MG capsule TAKE TWO CAPSULES BY MOUTH DAILY 60 capsule 5   fluticasone (FLONASE) 50 MCG/ACT nasal spray Place 2 sprays  into both nostrils daily. 16 g 6   Fluticasone-Umeclidin-Vilant (TRELEGY ELLIPTA) 200-62.5-25 MCG/ACT AEPB Inhale 1 puff into the lungs daily. 60 each 11   furosemide (LASIX) 20 MG tablet TAKE 1 TABLET BY MOUTH EVERY DAY AS NEEDED 30 tablet 90   glucose blood test strip Use as instructed 300 each 11   HYDROcodone-acetaminophen (NORCO) 10-325 MG tablet TAKE 1 TABLET BY MOUTH EVERY 6 HOURS AS NEEDED 120 tablet 0   levothyroxine (SYNTHROID) 125 MCG tablet Take 1 tablet (125 mcg total) by mouth daily before breakfast. 30 tablet 0   Lidocaine 4 % PTCH Place 1  patch onto the skin daily as needed (pain.).     loratadine (CLARITIN) 10 MG tablet Take 10 mg by mouth daily.     omeprazole (PRILOSEC) 40 MG capsule TAKE ONE CAPSULE BY MOUTH TWICE DAILY 60 capsule 2   ondansetron (ZOFRAN) 8 MG tablet Take 2 tablets (16 mg total) by mouth 2 (two) times daily as needed. 60 tablet 0   OXYGEN Inhale 3 L into the lungs at bedtime. Uses At Night     potassium chloride (KLOR-CON) 10 MEQ tablet Take 1 tablet (10 mEq total) by mouth at bedtime. 90 tablet 1   prochlorperazine (COMPAZINE) 10 MG tablet Take 1 tablet (10 mg total) by mouth every 6 (six) hours as needed for nausea or vomiting. 30 tablet 0   triamterene-hydrochlorothiazide (MAXZIDE-25) 37.5-25 MG tablet Take 1 tablet by mouth daily. 90 tablet 1   No current facility-administered medications on file prior to visit.    Observations/Objective: Patient sounds cheerful and well on the phone. I do not appreciate any SOB. Speech and thought processing are grossly intact. Patient reported vitals:  Assessment and Plan:  COVID-19   Discussed treatment options and risk of drug interactions, ideal treatment window, potential complications, isolation and precautions for COVID-19. Discussed potential drug interactions with her benzos and narcotic and she after lengthy discussion, the patient opted for treatment with Legevrio instead and due to being higher risk for complications of covid or severe disease and other factors. Discussed EUA status of this drug and the fact that there is preliminary limited knowledge of risks/interactions/side effects per EUA document vs possible benefits and precautions. This information was shared with patient during the visit and also was provided in patient instructions. She has Tessalon Rx from her oncologist to use for the cough. Other symptomatic care measures summarized in patient instructions. Advised to seek prompt virtual visit or in person care if worsening, new symptoms arise,  or if is not improving with treatment as expected per our conversation of expected course.    I discussed the assessment and treatment plan with the patient. The patient was provided an opportunity to ask questions and all were answered. The patient agreed with the plan and demonstrated an understanding of the instructions.    Follow Up Instructions:  I did not refer this patient for an OV with me in the next 24 hours for this/these issue(s).  I discussed the assessment and treatment plan with the patient. The patient was provided an opportunity to ask questions and all were answered. The patient agreed with the plan and demonstrated an understanding of the instructions.   I spent 11 minutes on the date of this visit in the care of this patient. See summary of tasks completed to properly care for this patient in the detailed notes above which also included counseling of above, review of PMH, medications, allergies, evaluation of the patient  and ordering and/or  instructing patient on testing and care options.     Lucretia Kern, DO

## 2021-02-13 NOTE — Telephone Encounter (Signed)
Patient schedueld per 1/16 inbasket COVID PROTOCOL, pt informed of rules for 1/26 appt, pt aware.

## 2021-02-13 NOTE — Patient Instructions (Signed)
HOME CARE TIPS:  -COVID19 testing information: [email or web address]  Most pharmacies also offer testing and home test kits. If the Covid19 test is positive and you desire antiviral treatment, please contact a [place] or schedule a follow up virtual visit through your primary care office or through the [employer].  Other test to treat options: [email or web address]  -I sent the medication(s) we discussed to your pharmacy: Meds ordered this encounter  Medications   molnupiravir EUA (LAGEVRIO) 200 mg CAPS capsule    Sig: Take 4 capsules (800 mg total) by mouth 2 (two) times daily for 5 days.    Dispense:  40 capsule    Refill:  0     -I sent in the [place] treatment or referral you requested per our discussion. Please see the information provided below and discuss further with the pharmacist/treatment team.    -there is a chance of rebound illness after finishing your treatment. If you become sick again please isolate for an additional 5 days, plus 5 more days of masking.   -can use tylenol f needed for fevers, aches and pains per instructions  -can use nasal saline a few times per day if you have nasal congestion  -stay hydrated, drink plenty of fluids and eat small healthy meals - avoid dairy  -stay home while sick, except to seek medical care. If you have COVID19, you will likely be contagious for 7-10 days. Flu or Influenza is likely contagious for about 7 days. Other respiratory viral infections remain contagious for 5-10+ days depending on the virus and many other factors. Wear a good mask that fits snugly (such as N95 or KN95) if around others to reduce the risk of transmission.  It was nice to meet you today, and I really hope you are feeling better soon. I help [facility] out with telemedicine visits on Tuesdays and Thursdays and am happy to help if you need a follow up virtual visit on those days.  Otherwise, if you have any concerns or questions following this visit please schedule a follow up visit with your Primary Care doctor or seek care at a local urgent care clinic to avoid delays in care.    Seek in person care or schedule a follow up video visit promptly if your symptoms worsen, new concerns arise or you are not improving with treatment. Call 911 and/or seek emergency care if your symptoms are severe or life threatening.    Fact Sheet for Patients And Caregivers Emergency Use Authorization (EUA) Of LAGEVRIO (molnupiravir) capsules For Coronavirus Disease 2019 (COVID-19)  What is the most important information I should know about LAGEVRIO? LAGEVRIO may cause serious side effects, including: ? LAGEVRIO may cause harm to your unborn baby. It is not known if LAGEVRIO will harm your baby if you take LAGEVRIO during pregnancy. o LAGEVRIO is not recommended for use in pregnancy. o LAGEVRIO has not been studied in pregnancy. LAGEVRIO was studied in pregnant animals only. When LAGEVRIO was given to pregnant animals, LAGEVRIO caused harm to their unborn babies. o You and your healthcare provider may decide that you should take LAGEVRIO during pregnancy if there are no other COVID-19 treatment options approved or authorized by the FDA that are accessible or clinically appropriate for you. o If you and your healthcare provider decide that you should take LAGEVRIO during pregnancy, you and your healthcare provider should discuss the known and potential benefits and the potential risks of taking LAGEVRIO during pregnancy. For individuals who are able to become pregnant: ?  You should use a reliable method of birth control (contraception) consistently and correctly during treatment with LAGEVRIO and for 4 days after the last dose of LAGEVRIO. Talk to your healthcare provider about reliable birth control methods. ? Before starting treatment with [facility] your healthcare provider may do a  pregnancy test to see if you are pregnant before starting treatment with LAGEVRIO. ? Tell your healthcare provider right away if you become pregnant or think you may be pregnant during treatment with LAGEVRIO. Pregnancy Surveillance Program: ? There is a pregnancy surveillance program for individuals who take LAGEVRIO during pregnancy. The purpose of this program is to collect information about the health of you and your baby. Talk to your healthcare provider about how to take part in this program. ? If you take LAGEVRIO during pregnancy and you agree to participate in the pregnancy surveillance program and allow your healthcare provider to share your information with [place], then your healthcare provider will report your use of [place] during pregnancy to [place]. by calling [phone] or [email or web address]. For individuals who are sexually active with partners who are able to become pregnant: ? It is not known if LAGEVRIO can affect sperm. While the risk is regarded as low, animal studies to fully assess the potential for LAGEVRIO to affect the babies of males treated with LAGEVRIO have not been completed. A reliable method of birth control (contraception) should be used consistently and correctly during treatment with LAGEVRIO and for at least 3 months after the last dose. The risk to sperm beyond 3 months is not known. Studies to understand the risk to sperm beyond 3 months are ongoing. Talk to your healthcare provider about reliable birth control methods. Talk to your healthcare provider if you have questions or concerns about how LAGEVRIO may affect sperm. You are being given this fact sheet because your healthcare provider believes it is necessary to provide you with LAGEVRIO for the treatment of adults with mild-to-moderate coronavirus disease 2019 (COVID-19) with positive results of direct SARS-CoV-2 viral testing, and who are at high risk  for progression to severe COVID-19 including hospitalization or death, and for whom other COVID-19 treatment options approved or authorized by the FDA are not accessible or clinically appropriate. The U.S. Food and Drug Administration (FDA) has issued an Emergency Use Authorization (EUA) to make LAGEVRIO available during the COVID-19 pandemic (for more details about an EUA please see What is an Emergency Use Authorization? at the end of this document). LAGEVRIO is not an FDA-approved medicine in the [place]. Read this Fact Sheet for information about LAGEVRIO. Talk to your healthcare provider about your options if you have any questions. It is your choice to take LAGEVRIO.  What is COVID-19? COVID-19 is caused by a virus called a coronavirus. You can get COVID-19 through close contact with another person who has the virus. COVID-19 illnesses have ranged from very mild-to-severe, including illness resulting in death. While information so far suggests that most COVID-19 illness is mild, serious illness can happen and may cause some of your other medical conditions to become worse. Older people and people of all ages with severe, long lasting (chronic) medical conditions like heart disease, lung disease and diabetes, for example seem to be at higher risk of being hospitalized for COVID-19.  What is LAGEVRIO? LAGEVRIO is an investigational medicine used to treat mild-to-moderate COVID-19 in adults: ? with positive results of direct SARS-CoV-2 viral testing, and ? who are at high risk  for progression to severe COVID-19 including hospitalization or death, and for whom other COVID-19 treatment options approved or authorized by the FDA are not accessible or clinically appropriate. The FDA has authorized the emergency use of LAGEVRIO for the treatment of mild-tomoderate COVID-19 in adults under an EUA. For more information on EUA, see the What is an Emergency Use Authorization (EUA)?  section at the end of this Fact Sheet. LAGEVRIO is not authorized: ? for use in people less than 71 years of age. ? for prevention of COVID-19. ? for people needing hospitalization for COVID-19. ? for use for longer than 5 consecutive days.  What should I tell my healthcare provider before I take LAGEVRIO? Tell your healthcare provider if you: ? Have any allergies ? Are breastfeeding or plan to breastfeed ? Have any serious illnesses ? Are taking any medicines (prescription, over-the-counter, vitamins, or herbal products).  How do I take LAGEVRIO? ? Take LAGEVRIO exactly as your healthcare provider tells you to take it. ? Take 4 capsules of LAGEVRIO every 12 hours (for example, at 8 am and at 8 pm) ? Take LAGEVRIO for 5 days. It is important that you complete the full 5 days of treatment with LAGEVRIO. Do not stop taking LAGEVRIO before you complete the full 5 days of treatment, even if you feel better. ? Take LAGEVRIO with or without food. ? You should stay in isolation for as long as your healthcare provider tells you to. Talk to your healthcare provider if you are not sure about how to properly isolate while you have COVID-19. ? Swallow LAGEVRIO capsules whole. Do not open, break, or crush the capsules. If you cannot swallow capsules whole, tell your healthcare provider. ? What to do if you miss a dose: o If it has been less than 10 hours since the missed dose, take it as soon as you remember o If it has been more than 10 hours since the missed dose, skip the missed dose and take your dose at the next scheduled time. ? Do not double the dose of LAGEVRIO to make up for a missed dose.  What are the important possible side effects of LAGEVRIO? ? See, What is the most important information I should know about LAGEVRIO? ? Allergic Reactions. Allergic reactions can happen in people taking LAGEVRIO, even after only 1 dose. Stop taking LAGEVRIO and call your healthcare provider right  away if you get any of the following symptoms of an allergic reaction: o hives o rapid heartbeat o trouble swallowing or breathing o swelling of the mouth, lips, or face o throat tightness o hoarseness o skin rash The most common side effects of LAGEVRIO are: ? diarrhea ? nausea ? dizziness These are not all the possible side effects of LAGEVRIO. Not many people have taken LAGEVRIO. Serious and unexpected side effects may happen. This medicine is still being studied, so it is possible that all of the risks are not known at this time.  What other treatment choices are there?  Veklury (remdesivir) is FDA-approved as an intravenous (IV) infusion for the treatment of mildto-moderate WVPXT-06 in certain adults and children. Talk with your doctor to see if [place] is appropriate for you. Like LAGEVRIO, FDA may also allow for the emergency use of other medicines to treat people with COVID-19. Go to [email or web address] for more information. It is your choice to be treated or not to be treated with LAGEVRIO. Should you decide not to take it, it will not change your standard  medical care.  What if I am breastfeeding? Breastfeeding is not recommended during treatment with LAGEVRIO and for 4 days after the last dose of LAGEVRIO. If you are breastfeeding or plan to breastfeed, talk to your healthcare provider about your options and specific situation before taking LAGEVRIO.  How do I report side effects with LAGEVRIO? Contact your healthcare provider if you have any side effects that bother you or do not go away. Report side effects to FDA MedWatch at SmoothHits.hu or call 1-800-FDA-1088 (1- 5154179449).  How should I store [place]? ? Store LAGEVRIO capsules at room temperature between 58F to 4F (20C to 25C). ? Keep LAGEVRIO and all medicines out of the reach of children and  pets. How can I learn more about COVID-19? ? Ask your healthcare provider. ? Visit [email or web address].[email or web address] ? Contact your local or state public health department. ? Call [place] at [phone] (toll free in the [place] ? Visit [email or web address]  What Is an Emergency Use Authorization (EUA)? The [place] FDA has made [place] available under an emergency access mechanism called an Emergency Use Authorization (EUA) The EUA is supported by a Presenter, broadcasting Health and Human Service (HHS) declaration that circumstances exist to justify emergency use of drugs and biological products during the COVID-19 pandemic. LAGEVRIO for the treatment of mild-to-moderate COVID-19 in adults with positive results of direct SARS-CoV-2 viral testing, who are at high risk for progression to severe COVID-19, including hospitalization or death, and for whom alternative COVID-19 treatment options approved or authorized by FDA are not accessible or clinically appropriate, has not undergone the same type of review as an FDA-approved product. In issuing an EUA under the THYHO-88 public health emergency, the FDA has determined, among other things, that based on the total amount of scientific evidence available including data from adequate and well-controlled clinical trials, if available, it is reasonable to believe that the product may be effective for diagnosing, treating, or preventing COVID-19, or a serious or life-threatening disease or condition caused by COVID-19; that the known and potential benefits of the product, when used to diagnose, treat, or prevent such disease or condition, outweigh the known and potential risks of such product; and that there are no adequate, approved, and available alternatives.  All of these criteria must be met to allow for the product to be used in the treatment of patients during the COVID-19 pandemic. The EUA for LAGEVRIO is in effect for the duration of  the COVID-19 declaration justifying emergency use of LAGEVRIO, unless terminated or revoked (after which LAGEVRIO may no longer be used under the EUA). For patent information: http://rogers.info/ Copyright  2021-2022 [place] [employer] [place], [place] and its affiliates. All rights reserved. [id number] Revised: March 2022

## 2021-02-14 ENCOUNTER — Telehealth: Payer: Self-pay

## 2021-02-14 NOTE — Telephone Encounter (Signed)
Calling patient back regarding message left with [facility] Access nurse on 02/12/21 @ 6:13 PM regarding message from her pharmacist stating  that her Plaxovid prescription interacts with her Norco and Xanax. MD on call directed pt to contact her PCP which she did on 02/13/21. She received a prescription for Lagevrio from PCP. Patient voiced no other concerns.

## 2021-02-15 ENCOUNTER — Ambulatory Visit: Payer: Medicare HMO | Admitting: Cardiovascular Disease

## 2021-02-18 ENCOUNTER — Other Ambulatory Visit: Payer: Self-pay | Admitting: Nurse Practitioner

## 2021-02-18 ENCOUNTER — Other Ambulatory Visit: Payer: Self-pay | Admitting: Physician Assistant

## 2021-02-18 DIAGNOSIS — R609 Edema, unspecified: Secondary | ICD-10-CM

## 2021-02-19 ENCOUNTER — Other Ambulatory Visit: Payer: Self-pay | Admitting: Physician Assistant

## 2021-02-22 ENCOUNTER — Encounter: Payer: No Typology Code available for payment source | Admitting: Nutrition

## 2021-02-22 ENCOUNTER — Other Ambulatory Visit: Payer: Self-pay | Admitting: Physician Assistant

## 2021-02-22 ENCOUNTER — Other Ambulatory Visit: Payer: Self-pay

## 2021-02-22 ENCOUNTER — Inpatient Hospital Stay: Payer: No Typology Code available for payment source

## 2021-02-22 ENCOUNTER — Inpatient Hospital Stay
Payer: No Typology Code available for payment source | Attending: Hematology and Oncology | Admitting: Hematology and Oncology

## 2021-02-22 VITALS — BP 124/80 | HR 89 | Temp 98.3°F | Resp 18 | Wt 199.3 lb

## 2021-02-22 DIAGNOSIS — F32A Depression, unspecified: Secondary | ICD-10-CM | POA: Insufficient documentation

## 2021-02-22 DIAGNOSIS — E039 Hypothyroidism, unspecified: Secondary | ICD-10-CM | POA: Diagnosis not present

## 2021-02-22 DIAGNOSIS — Z90721 Acquired absence of ovaries, unilateral: Secondary | ICD-10-CM | POA: Diagnosis not present

## 2021-02-22 DIAGNOSIS — C3490 Malignant neoplasm of unspecified part of unspecified bronchus or lung: Secondary | ICD-10-CM

## 2021-02-22 DIAGNOSIS — F419 Anxiety disorder, unspecified: Secondary | ICD-10-CM | POA: Insufficient documentation

## 2021-02-22 DIAGNOSIS — Z882 Allergy status to sulfonamides status: Secondary | ICD-10-CM | POA: Insufficient documentation

## 2021-02-22 DIAGNOSIS — Z886 Allergy status to analgesic agent status: Secondary | ICD-10-CM | POA: Diagnosis not present

## 2021-02-22 DIAGNOSIS — Z880 Allergy status to penicillin: Secondary | ICD-10-CM | POA: Diagnosis not present

## 2021-02-22 DIAGNOSIS — F17210 Nicotine dependence, cigarettes, uncomplicated: Secondary | ICD-10-CM | POA: Insufficient documentation

## 2021-02-22 DIAGNOSIS — M1990 Unspecified osteoarthritis, unspecified site: Secondary | ICD-10-CM | POA: Diagnosis not present

## 2021-02-22 DIAGNOSIS — Z836 Family history of other diseases of the respiratory system: Secondary | ICD-10-CM | POA: Insufficient documentation

## 2021-02-22 DIAGNOSIS — Z8719 Personal history of other diseases of the digestive system: Secondary | ICD-10-CM | POA: Diagnosis not present

## 2021-02-22 DIAGNOSIS — Z5112 Encounter for antineoplastic immunotherapy: Secondary | ICD-10-CM | POA: Insufficient documentation

## 2021-02-22 DIAGNOSIS — I10 Essential (primary) hypertension: Secondary | ICD-10-CM | POA: Diagnosis not present

## 2021-02-22 DIAGNOSIS — E119 Type 2 diabetes mellitus without complications: Secondary | ICD-10-CM | POA: Insufficient documentation

## 2021-02-22 DIAGNOSIS — E785 Hyperlipidemia, unspecified: Secondary | ICD-10-CM | POA: Diagnosis not present

## 2021-02-22 DIAGNOSIS — J449 Chronic obstructive pulmonary disease, unspecified: Secondary | ICD-10-CM | POA: Insufficient documentation

## 2021-02-22 DIAGNOSIS — Z8249 Family history of ischemic heart disease and other diseases of the circulatory system: Secondary | ICD-10-CM | POA: Insufficient documentation

## 2021-02-22 DIAGNOSIS — Z79899 Other long term (current) drug therapy: Secondary | ICD-10-CM | POA: Insufficient documentation

## 2021-02-22 DIAGNOSIS — Z9049 Acquired absence of other specified parts of digestive tract: Secondary | ICD-10-CM | POA: Insufficient documentation

## 2021-02-22 DIAGNOSIS — Z820 Family history of epilepsy and other diseases of the nervous system: Secondary | ICD-10-CM | POA: Insufficient documentation

## 2021-02-22 DIAGNOSIS — Z8261 Family history of arthritis: Secondary | ICD-10-CM | POA: Diagnosis not present

## 2021-02-22 DIAGNOSIS — Z5111 Encounter for antineoplastic chemotherapy: Secondary | ICD-10-CM | POA: Insufficient documentation

## 2021-02-22 DIAGNOSIS — Z885 Allergy status to narcotic agent status: Secondary | ICD-10-CM | POA: Insufficient documentation

## 2021-02-22 DIAGNOSIS — Z87891 Personal history of nicotine dependence: Secondary | ICD-10-CM | POA: Diagnosis not present

## 2021-02-22 DIAGNOSIS — Z811 Family history of alcohol abuse and dependence: Secondary | ICD-10-CM | POA: Diagnosis not present

## 2021-02-22 DIAGNOSIS — Z95828 Presence of other vascular implants and grafts: Secondary | ICD-10-CM

## 2021-02-22 DIAGNOSIS — C3431 Malignant neoplasm of lower lobe, right bronchus or lung: Secondary | ICD-10-CM | POA: Insufficient documentation

## 2021-02-22 DIAGNOSIS — Z823 Family history of stroke: Secondary | ICD-10-CM | POA: Insufficient documentation

## 2021-02-22 LAB — CBC WITH DIFFERENTIAL (CANCER CENTER ONLY)
Abs Immature Granulocytes: 0.2 10*3/uL — ABNORMAL HIGH (ref 0.00–0.07)
Basophils Absolute: 0 10*3/uL (ref 0.0–0.1)
Basophils Relative: 0 %
Eosinophils Absolute: 0.1 10*3/uL (ref 0.0–0.5)
Eosinophils Relative: 1 %
HCT: 28.3 % — ABNORMAL LOW (ref 36.0–46.0)
Hemoglobin: 8.5 g/dL — ABNORMAL LOW (ref 12.0–15.0)
Immature Granulocytes: 2 %
Lymphocytes Relative: 31 %
Lymphs Abs: 3 10*3/uL (ref 0.7–4.0)
MCH: 28.7 pg (ref 26.0–34.0)
MCHC: 30 g/dL (ref 30.0–36.0)
MCV: 95.6 fL (ref 80.0–100.0)
Monocytes Absolute: 1.5 10*3/uL — ABNORMAL HIGH (ref 0.1–1.0)
Monocytes Relative: 16 %
Neutro Abs: 4.9 10*3/uL (ref 1.7–7.7)
Neutrophils Relative %: 50 %
Platelet Count: 313 10*3/uL (ref 150–400)
RBC: 2.96 MIL/uL — ABNORMAL LOW (ref 3.87–5.11)
RDW: 24.6 % — ABNORMAL HIGH (ref 11.5–15.5)
WBC Count: 9.7 10*3/uL (ref 4.0–10.5)
nRBC: 0.4 % — ABNORMAL HIGH (ref 0.0–0.2)

## 2021-02-22 LAB — CMP (CANCER CENTER ONLY)
ALT: 10 U/L (ref 0–44)
AST: 16 U/L (ref 15–41)
Albumin: 3.5 g/dL (ref 3.5–5.0)
Alkaline Phosphatase: 102 U/L (ref 38–126)
Anion gap: 8 (ref 5–15)
BUN: 10 mg/dL (ref 8–23)
CO2: 25 mmol/L (ref 22–32)
Calcium: 8.8 mg/dL — ABNORMAL LOW (ref 8.9–10.3)
Chloride: 103 mmol/L (ref 98–111)
Creatinine: 0.94 mg/dL (ref 0.44–1.00)
GFR, Estimated: >60 mL/min (ref >60)
Glucose, Bld: 94 mg/dL (ref 70–99)
Potassium: 4.2 mmol/L (ref 3.5–5.1)
Sodium: 136 mmol/L (ref 135–145)
Total Bilirubin: 0.5 mg/dL (ref 0.3–1.2)
Total Protein: 6.9 g/dL (ref 6.5–8.1)

## 2021-02-22 LAB — TSH: TSH: 0.375 u[IU]/mL (ref 0.308–3.960)

## 2021-02-22 MED ORDER — SODIUM CHLORIDE 0.9 % IV SOLN
1200.0000 mg | Freq: Once | INTRAVENOUS | Status: AC
  Administered 2021-02-22: 1200 mg via INTRAVENOUS
  Filled 2021-02-22: qty 20

## 2021-02-22 MED ORDER — SODIUM CHLORIDE 0.9% FLUSH
10.0000 mL | INTRAVENOUS | Status: DC | PRN
  Administered 2021-02-22: 10 mL

## 2021-02-22 MED ORDER — SODIUM CHLORIDE 0.9 % IV SOLN: Freq: Once | INTRAVENOUS | Status: AC

## 2021-02-22 MED ORDER — HEPARIN SOD (PORK) LOCK FLUSH 100 UNIT/ML IV SOLN
500.0000 [IU] | Freq: Once | INTRAVENOUS | Status: AC | PRN
  Administered 2021-02-22: 500 [IU]

## 2021-02-22 NOTE — Progress Notes (Signed)
[place] [phone]   [phone]  PROGRESS NOTE  Patient Care Team: Allwardt, Randa Evens, PA-C as PCP - General (Physician Assistant) Milus Banister, MD as Attending Physician (Gastroenterology) Dr. Darylene Price, MD as Consulting Physician (Orthopedic Surgery) Latanya Maudlin, MD as Consulting Physician (Orthopedic Surgery) Kathrynn Ducking, MD (Inactive) as Consulting Physician (Neurology) Okey Regal, [place] as Consulting Physician (Optometry) [name], [facility] as Pharmacist (Pharmacist) Cameron Sprang, MD as Consulting Physician (Neurology) Valrie Hart, RN as Oncology Nurse Navigator (Oncology) Leona Singleton, RN as [facility]  Hematological/Oncological History # Small Cell Lung Cancer, Extensive Stage 07/05/2020: CT abdomen for lower abdominal pain. New pulmonary nodular density noted 07/06/2020: CT chest showed 2.2 cm macrolobulated right lower lobe pulmonary nodule (favored) versus pathologically enlarged infrahilar lymph node 10/13/2020: PET CT scan performed, findings show 2 cm right lower lobe lung mass is hypermetabolic and consistent with primary lung neoplasm. Additionally found hypermetabolic 17 mm soft tissue lesion between the descending duodenum and the pancreatic head  10/19/2020: EGD to biopsy hypermetabolic lymph node. Biopsy results show small cell lung cancer 10/26/2020: establish care with Dr. Lorenso Courier  11/13/2020: Cycle 1 Day 1 of Carbo/Etop/Atezolizumab 12/04/2020: Cycle 2 Day 1 of Carbo/Etop/Atezolizumab 11/22-11/25/2022: admitted for E. Coli bacteremia/sepsis. Start of Cycle 3 delayed. 01/02/2021: Cycle 3 Day 1 of Carbo/Etop/Atezolizumab 01/23/2021: Cycle 4 Day 1 of Carbo/Etop/Atezolizumab 02/22/2021: Cycle 5 Day 1 of Atezolizumab Maintenance. Delayed start due to patient's COVID infection.   Interval History:  AMEN DARGIS 66 y.o. female with medical history significant for  extensive stage small cell lung cancer who presents for a follow up visit. The patient's last visit was on 01/23/2021. In the interim since the last visit she has completed cycle 4 of chemotherapy. She presents today to start cycle 5 of chemotherapy.  On exam today Mrs. Chaplin is seen in infusion.  Today she does endorse some continued symptoms from her COVID infection.  She remains hoarse and does continue to have a cough.  Tessalon Perles have been helping with this cough.  She notes that fatigue has been her other major symptom but otherwise she has been well.  She reports her appetite has been good and she is eating well.  She does not have any major side effects as result of her last cycle.   She denies any bleeding, bruising, or dark stools.   She currently denies any fevers, chills, sweats, nausea, vomiting or diarrhea.  A full 10 point ROS is listed below.   MEDICAL HISTORY:  Past Medical History:  Diagnosis Date   Allergic rhinitis    Anemia    Anxiety    Chicken pox    Chronic back pain    COPD (chronic obstructive pulmonary disease) (HCC)    Depression    DM (diabetes mellitus) ([place])    Essential hypertension    GERD (gastroesophageal reflux disease)    Headache    migraines   History of gastritis    EGD 2015   History of home oxygen therapy    2 liters at hs last 6 months   Hyperlipidemia    Hypothyroidism    Migraines    Osteoarthritis    oa   Scoliosis     SURGICAL HISTORY: Past Surgical History:  Procedure Laterality Date   APPENDECTOMY     1985   BIOPSY  07/24/2017   Procedure: BIOPSY;  Surgeon: Milus Banister, MD;  Location: [facility];  Service: Endoscopy;;   CARDIAC CATHETERIZATION N/A 10/31/2015   Procedure: Left Heart Cath and Coronary Angiography;  Surgeon: Leonie Man, MD;  Location: [facility];  Service: Cardiovascular;  Laterality: N/A;   CARPAL TUNNEL RELEASE Left    CARPAL TUNNEL RELEASE Right    CHOLECYSTECTOMY  late 1980's    COLONOSCOPY WITH PROPOFOL N/A 07/24/2017   Procedure: COLONOSCOPY WITH PROPOFOL;  Surgeon: Milus Banister, MD;  [facility];  Service: Endoscopy;  Laterality: N/A;   ESOPHAGOGASTRODUODENOSCOPY N/A 07/24/2017   Procedure: ESOPHAGOGASTRODUODENOSCOPY (EGD);  Surgeon: Milus Banister, MD;  Location: [facility];  Service: Endoscopy;  Laterality: N/A;   ESOPHAGOGASTRODUODENOSCOPY (EGD) WITH PROPOFOL N/A 10/19/2020   Procedure: ESOPHAGOGASTRODUODENOSCOPY (EGD) WITH PROPOFOL;  Surgeon: Milus Banister, MD;  [facility];  Service: Endoscopy;  Laterality: N/A;   FINE NEEDLE ASPIRATION N/A 10/19/2020   Procedure: FINE NEEDLE ASPIRATION (FNA) LINEAR;  Surgeon: Milus Banister, MD;  [facility];  Service: Endoscopy;  Laterality: N/A;   GALLBLADDER SURGERY  1991   HIP CLOSED REDUCTION Right 01/08/2016   Procedure: CLOSED MANIPULATION HIP;  Surgeon: Susa Day, MD;  [facility];  Service: Orthopedics;  Laterality: Right;   HIP CLOSED REDUCTION Right 01/19/2016   Procedure: ATTEMPTED CLOSED REDUCTION RIGHT HIP;  Surgeon: Wylene Simmer, MD;  [facility];  Service: Orthopedics;  Laterality: Right;   HIP CLOSED REDUCTION Right 01/20/2016   Procedure: CLOSED REDUCTION RIGHT TOTAL HIP;  Surgeon: Paralee Cancel, MD;  [facility];  Service: Orthopedics;  Laterality: Right;   HIP CLOSED REDUCTION Right 02/17/2016   Procedure: CLOSED REDUCTION RIGHT TOTAL HIP;  Surgeon: Rod Can, MD;  Location: [facility];  Service: Orthopedics;  Laterality: Right;   HIP CLOSED REDUCTION Right 02/28/2016   Procedure: CLOSED REDUCTION HIP;  Surgeon: Nicholes Stairs, MD;  [facility];  Service: Orthopedics;  Laterality: Right;   IR IMAGING GUIDED PORT INSERTION  11/01/2020   POLYPECTOMY  07/24/2017   Procedure: POLYPECTOMY;  Surgeon: Milus Banister, MD;  [facility];  Service: Endoscopy;;   [place], with 1 ovary removed  and 2 nd ovary removed 2003   TOTAL HIP ARTHROPLASTY Right    Original surgery 2006 with revision 2010   TOTAL HIP REVISION Right 01/01/2016   Procedure: TOTAL HIP REVISION;  Surgeon: Paralee Cancel, MD;  [facility];  Service: Orthopedics;  Laterality: Right;   TOTAL HIP REVISION Right 03/02/2016   Procedure: TOTAL HIP REVISION;  Surgeon: Paralee Cancel, MD;  [facility];  Service: Orthopedics;  Laterality: Right;   TOTAL HIP REVISION Right 09/02/2016   Procedure: Right hip constrained liner- posterior;  Surgeon: Paralee Cancel, MD;  [facility];  Service: Orthopedics;  Laterality: Right;   ULNAR NERVE TRANSPOSITION Right    UPPER ESOPHAGEAL ENDOSCOPIC ULTRASOUND (EUS) N/A 10/19/2020   Procedure: UPPER ESOPHAGEAL ENDOSCOPIC ULTRASOUND (EUS);  Surgeon: Milus Banister, MD;  Location: [facility];  Service: Endoscopy;  Laterality: N/A;  periduodenal lesion    SOCIAL HISTORY: Social History   Socioeconomic History   Marital status: Married    Spouse name: Not on file   Number of children: 2   Years of education: Not on file   Highest education level: Not on file  Occupational History   Occupation: disabled   Occupation: disabled  Tobacco Use   Smoking status: Every Day    Packs/day: 2.00  Years: 46.00    Pack years: 92.00    Types: Cigarettes   Smokeless tobacco: Never   Tobacco comments:    Currently smoking 1.5ppd as of 11/10/20  Vaping Use   Vaping Use: Never used  Substance and Sexual Activity   Alcohol use: No   Drug use: No   Sexual activity: Not Currently    Partners: Male  Other Topics Concern   Not on file  Social History Narrative   Right handed    Caffeine~ 2 cups per day    Lives at home with husband (strained relationship)   Primary caretaker for disabled brother who had aneurism   Daughter died 2018-07-05    Social Determinants of Radio broadcast assistant Strain: Not on file  Food Insecurity: Not on file  Transportation Needs: Not on file   Physical Activity: Not on file  Stress: Not on file  Social Connections: Not on file  Intimate Partner Violence: Not on file    FAMILY HISTORY: Family History  Problem Relation Age of Onset   COPD Mother    Heart disease Mother    Lung disease Father        Asbestosis   Heart attack Father    Heart disease Father    Cerebral aneurysm Brother    Aneurysm Brother        Brain   Drug abuse Daughter    Epilepsy Son    Alcohol abuse Son    Drug abuse Son    Arthritis Maternal Grandmother    Heart disease Maternal Grandmother    Asthma Maternal Grandfather    Cancer Maternal Grandfather    Arthritis Paternal Grandmother    Heart disease Paternal Grandmother    Stroke Paternal Grandmother    Early death Paternal Grandfather    Heart disease Paternal Grandfather     ALLERGIES:  is allergic to metformin and related, nsaids, aleve [naproxen sodium], codeine, penicillins, and sulfonamide derivatives.  MEDICATIONS:  Current Outpatient Medications  Medication Sig Dispense Refill   albuterol (PROAIR HFA) 108 (90 Base) MCG/ACT inhaler 2 puffs every 4 hours as needed only  if your can't catch your breath 18 g 3   ALPRAZolam (XANAX) 1 MG tablet Take 1 tablet po TID as needed for anxiety. 90 tablet 2   aspirin EC 81 MG tablet Take 1 tablet (81 mg total) by mouth daily. Swallow whole. 90 tablet 3   atorvastatin (LIPITOR) 20 MG tablet TAKE 1 TABLET BY MOUTH AT BEDTIME 90 tablet 1   benzonatate (TESSALON) 100 MG capsule Take 1 capsule (100 mg total) by mouth 3 (three) times daily. 30 capsule 1   cyclobenzaprine (FLEXERIL) 10 MG tablet Take 1 tablet (10 mg total) by mouth 3 (three) times daily as needed for muscle spasms. 90 tablet 5   DULoxetine (CYMBALTA) 60 MG capsule TAKE TWO CAPSULES BY MOUTH DAILY 60 capsule 5   fluticasone (FLONASE) 50 MCG/ACT nasal spray Place 2 sprays into both nostrils daily. 16 g 6   Fluticasone-Umeclidin-Vilant (TRELEGY ELLIPTA) 200-62.5-25 MCG/ACT AEPB Inhale 1  puff into the lungs daily. 60 each 11   furosemide (LASIX) 20 MG tablet TAKE 1 TABLET BY MOUTH EVERY DAY AS NEEDED 30 tablet 90   glucose blood test strip Use as instructed 300 each 11   HYDROcodone-acetaminophen (NORCO) 10-325 MG tablet TAKE 1 TABLET BY MOUTH EVERY 6 HOURS AS NEEDED 120 tablet 0   levothyroxine (SYNTHROID) 125 MCG tablet Take 1 tablet (125 mcg total) by  mouth daily before breakfast. 30 tablet 0   Lidocaine 4 % PTCH Place 1 patch onto the skin daily as needed (pain.).     loratadine (CLARITIN) 10 MG tablet Take 10 mg by mouth daily.     omeprazole (PRILOSEC) 40 MG capsule TAKE ONE CAPSULE BY MOUTH TWICE DAILY 60 capsule 2   ondansetron (ZOFRAN) 8 MG tablet Take 2 tablets (16 mg total) by mouth 2 (two) times daily as needed. 60 tablet 0   OXYGEN Inhale 3 L into the lungs at bedtime. Uses At Night     potassium chloride (KLOR-CON M) 10 MEQ tablet TAKE 1 TABLET BY MOUTH AT BEDTIME 90 tablet 1   prochlorperazine (COMPAZINE) 10 MG tablet Take 1 tablet (10 mg total) by mouth every 6 (six) hours as needed for nausea or vomiting. 30 tablet 0   triamterene-hydrochlorothiazide (MAXZIDE-25) 37.5-25 MG tablet Take 1 tablet by mouth daily. 90 tablet 1   No current facility-administered medications for this visit.   Facility-Administered Medications Ordered in Other Visits  Medication Dose Route Frequency Provider Last Rate Last Admin   atezolizumab (TECENTRIQ) 1,200 mg in sodium chloride 0.9 % 250 mL chemo infusion  1,200 mg Intravenous Once Ledell Peoples IV, MD 540 mL/hr at 02/22/21 1436 1,200 mg at 02/22/21 1436   heparin lock flush 100 unit/mL  500 Units Intracatheter Once PRN Orson Slick, MD       sodium chloride flush (NS) 0.9 % injection 10 mL  10 mL Intracatheter PRN Orson Slick, MD        REVIEW OF SYSTEMS:   Constitutional: ( - ) fevers, ( - )  chills , ( - ) night sweats Eyes: ( - ) blurriness of vision, ( - ) double vision, ( - ) watery eyes Ears, nose, mouth,  throat, and face: ( - ) mucositis, ( - ) sore throat Respiratory: ( - ) cough, ( - ) dyspnea, ( - ) wheezes Cardiovascular: ( - ) palpitation, ( - ) chest discomfort, ( - ) lower extremity swelling Gastrointestinal:  ( - ) nausea, ( - ) heartburn, ( - ) change in bowel habits Skin: ( - ) abnormal skin rashes Lymphatics: ( - ) new lymphadenopathy, ( - ) easy bruising Neurological: ( - ) numbness, ( - ) tingling, ( - ) new weaknesses Behavioral/Psych: ( - ) mood change, ( - ) new changes  All other systems were reviewed with the patient and are negative.  PHYSICAL EXAMINATION: ECOG PERFORMANCE STATUS: 1 - Symptomatic but completely ambulatory  There were no vitals filed for this visit.  There were no vitals filed for this visit.   GENERAL: Well-appearing middle-age Caucasian female, alert, no distress and comfortable SKIN: skin color, texture, turgor are normal, no rashes or significant lesions EYES: conjunctiva are pink and non-injected, sclera clear LUNGS: clear to auscultation and percussion with normal breathing effort HEART: regular rate & rhythm and no murmurs and no lower extremity edema Musculoskeletal: no cyanosis of digits and no clubbing  PSYCH: alert & oriented x 3, fluent speech NEURO: no focal motor/sensory deficits  LABORATORY DATA:  I have reviewed the data as listed CBC Latest Ref Rng & Units 02/22/2021 01/23/2021 01/02/2021  WBC 4.0 - 10.5 K/uL 9.7 9.9 10.3  Hemoglobin 12.0 - 15.0 g/dL 8.5(L) 9.6(L) 10.0(L)  Hematocrit 36.0 - 46.0 % 28.3(L) 30.6(L) 32.3(L)  Platelets 150 - 400 K/uL 313 244 264    CMP Latest Ref Rng & Units 02/22/2021  01/23/2021 01/02/2021  Glucose 70 - 99 mg/dL 94 91 89  BUN 8 - 23 mg/dL 10 8 9   Creatinine 0.44 - 1.00 mg/dL 0.94 0.82 0.85  Sodium 135 - 145 mmol/L 136 138 141  Potassium 3.5 - 5.1 mmol/L 4.2 3.9 3.7  Chloride 98 - 111 mmol/L 103 104 106  CO2 22 - 32 mmol/L 25 26 23   Calcium 8.9 - 10.3 mg/dL 8.8(L) 8.7(L) 8.6(L)  Total Protein  6.5 - 8.1 g/dL 6.9 6.2(L) 6.4(L)  Total Bilirubin 0.3 - 1.2 mg/dL 0.5 0.4 0.4  Alkaline Phos 38 - 126 U/L 102 107 96  AST 15 - 41 U/L 16 15 22   ALT 0 - 44 U/L 10 11 17     No results found for: MPROTEIN Lab Results  Component Value Date   KPAFRELGTCHN 0.75 06/30/2014   LAMBDASER 3.78 (H) 06/30/2014   KAPLAMBRATIO 0.20 (L) 06/30/2014     RADIOGRAPHIC STUDIES: No results found.  ASSESSMENT & PLAN [name] 66 y.o. female with medical history significant for extensive stage small cell lung cancer who presents for a follow up visit.   After review of the labs, review of the records, and discussion with the patient the patients findings are most consistent with extensive stage small cell lung cancer with metastasis from the right lower lobe to the lymph nodes of the abdomen.  At this time we will pursue triple therapy with carboplatin, etoposide, and atezolizumab.  After 4 cycles we will convert to maintenance atezolizumab alone.  We previously discussed the risks and benefits of this therapy and the patient was in agreement to proceed with this treatment.  The treatment of choice consist of carboplatin, etoposide, and atezolizumab.  The regimen consists of carboplatin AUC of 5 IV on day 1, etoposide 100 mg per metered squared IV on day 1, 2, and 3 and atezolizumab 1200 mg on day 1.  This continues for 21-day cycles.  After 4 cycles the patient proceeds with atezolizumab maintenance therapy alone.   #Small Cell Lung Cancer, Extensive Stage -- MRI of the brain shows no evidence of intracranial spread --Findings are currently consistent with metastatic small cell lung cancer with metastatic spread to the lymph nodes of the abdomen --Prognosis is poor with anticipated life span of less than 12 months -- Patient has extensive stage due to metastatic spread to lymph nodes in the abdomen --Plan to proceed with carboplatin, etoposide, and atezolizumab --will consider prophylactic cranial  radiation after start of chemotherapy.  Plan: --today is Cycle 5 Day 1 of Atezolizumab maintenance --CT scan q 3 months. This will be due in March 2023. Last scan in Dec 2022 showed an excellent response to therapy.  --RTC in 3 weeks for next cycle of maintenance atezolizumab    #Supportive Care -- chemotherapy education complete -- port placed -- zofran 8mg  q8H PRN and compazine 10mg  PO q6H for nausea -- EMLA cream for port -- no pain medication required at this time.   No orders of the defined types were placed in this encounter.   All questions were answered. The patient knows to call the clinic with any problems, questions or concerns.  A total of more than 30 minutes were spent on this encounter with face-to-face time and non-face-to-face time, including preparing to see the patient, ordering tests and/or medications, counseling the patient and coordination of care as outlined above.   Ledell Peoples, MD Department of Hematology/Oncology [place] [facility] Phone: [phone] Pager:  [phone] Email: [email or web address]  02/22/2021 2:58 PM

## 2021-02-22 NOTE — Patient Instructions (Addendum)
[facility] ONCOLOGY  Discharge Instructions: Thank you for choosing [facility] to provide your oncology and hematology care.   If you have a lab appointment with the [facility], please go directly to the [facility] and check in at the registration area.   Wear comfortable clothing and clothing appropriate for easy access to any Portacath or PICC line.   We strive to give you quality time with your provider. You may need to reschedule your appointment if you arrive late (15 or more minutes).  Arriving late affects you and other patients whose appointments are after yours.  Also, if you miss three or more appointments without notifying the office, you may be dismissed from the clinic at the providers discretion.      For prescription refill requests, have your pharmacy contact our office and allow 72 hours for refills to be completed.    Today you received the following chemotherapy and/or immunotherapy agents: Atezolizumab   To help prevent nausea and vomiting after your treatment, we encourage you to take your nausea medication as directed.  BELOW ARE SYMPTOMS THAT SHOULD BE REPORTED IMMEDIATELY: *FEVER GREATER THAN 100.4 F (38 C) OR HIGHER *CHILLS OR SWEATING *NAUSEA AND VOMITING THAT IS NOT CONTROLLED WITH YOUR NAUSEA MEDICATION *UNUSUAL SHORTNESS OF BREATH *UNUSUAL BRUISING OR BLEEDING *URINARY PROBLEMS (pain or burning when urinating, or frequent urination) *BOWEL PROBLEMS (unusual diarrhea, constipation, pain near the anus) TENDERNESS IN MOUTH AND THROAT WITH OR WITHOUT PRESENCE OF ULCERS (sore throat, sores in mouth, or a toothache) UNUSUAL RASH, SWELLING OR PAIN  UNUSUAL VAGINAL DISCHARGE OR ITCHING   Items with * indicate a potential emergency and should be followed up as soon as possible or go to the Emergency Department if any problems should occur.  Please show the CHEMOTHERAPY ALERT CARD or IMMUNOTHERAPY ALERT CARD at check-in to  the Emergency Department and triage nurse.  Should you have questions after your visit or need to cancel or reschedule your appointment, please contact [facility]  Dept: [phone]  and follow the prompts.  Office hours are 8:00 a.m. to 4:30 p.m. Monday - Friday. Please note that voicemails left after 4:00 p.m. may not be returned until the following business day.  We are closed weekends and major holidays. You have access to a nurse at all times for urgent questions. Please call the main number to the clinic Dept: [phone] and follow the prompts.   For any non-urgent questions, you may also contact your provider using MyChart. We now offer e-Visits for anyone 62 and older to request care online for non-urgent symptoms. For details visit [email or web address].   Also download the MyChart app! Go to the app store, search "MyChart", open the app, select [facility], and log in with your MyChart username and password.  Due to Covid, a mask is required upon entering the hospital/clinic. If you do not have a mask, one will be given to you upon arrival. For doctor visits, patients may have 1 support person aged 62 or older with them. For treatment visits, patients cannot have anyone with them due to current Covid guidelines and our immunocompromised population.   Atezolizumab injection What is this medication? ATEZOLIZUMAB (a te zoe LIZ ue mab) is a monoclonal antibody. It is used to treat bladder cancer (urothelial cancer), liver cancer, lung cancer, and melanoma. This medicine may be used for other purposes; ask your health care provider or pharmacist if you have questions. COMMON  BRAND NAME(S): Tecentriq What should I tell my care team before I take this medication? They need to know if you have any of these conditions: autoimmune diseases like Crohn's disease, ulcerative colitis, or lupus have had or planning to have an allogeneic stem cell transplant (uses  someone else's stem cells) history of organ transplant history of radiation to the chest nervous system problems like myasthenia gravis or Guillain-Barre syndrome an unusual or allergic reaction to atezolizumab, other medicines, foods, dyes, or preservatives pregnant or trying to get pregnant breast-feeding How should I use this medication? This medicine is for infusion into a vein. It is given by a health care professional in a hospital or clinic setting. A special MedGuide will be given to you before each treatment. Be sure to read this information carefully each time. Talk to your pediatrician regarding the use of this medicine in children. Special care may be needed. Overdosage: If you think you have taken too much of this medicine contact a poison control center or emergency room at once. NOTE: This medicine is only for you. Do not share this medicine with others. What if I miss a dose? It is important not to miss your dose. Call your doctor or health care professional if you are unable to keep an appointment. What may interact with this medication? Interactions have not been studied. This list may not describe all possible interactions. Give your health care provider a list of all the medicines, herbs, non-prescription drugs, or dietary supplements you use. Also tell them if you smoke, drink alcohol, or use illegal drugs. Some items may interact with your medicine. What should I watch for while using this medication? Your condition will be monitored carefully while you are receiving this medicine. You may need blood work done while you are taking this medicine. Do not become pregnant while taking this medicine or for at least 5 months after stopping it. Women should inform their doctor if they wish to become pregnant or think they might be pregnant. There is a potential for serious side effects to an unborn child. Talk to your health care professional or pharmacist for more information. Do  not breast-feed an infant while taking this medicine or for at least 5 months after the last dose. What side effects may I notice from receiving this medication? Side effects that you should report to your doctor or health care professional as soon as possible: allergic reactions like skin rash, itching or hives, swelling of the face, lips, or tongue black, tarry stools bloody or watery diarrhea breathing problems changes in vision chest pain or chest tightness chills facial flushing fever headache signs and symptoms of high blood sugar such as dizziness; dry mouth; dry skin; fruity breath; nausea; stomach pain; increased hunger or thirst; increased urination signs and symptoms of liver injury like dark yellow or brown urine; general ill feeling or flu-like symptoms; light-colored stools; loss of appetite; nausea; right upper belly pain; unusually weak or tired; yellowing of the eyes or skin stomach pain trouble passing urine or change in the amount of urine Side effects that usually do not require medical attention (report to your doctor or health care professional if they continue or are bothersome): bone pain cough diarrhea joint pain muscle pain muscle weakness swelling of arms or legs tiredness weight loss This list may not describe all possible side effects. Call your doctor for medical advice about side effects. You may report side effects to FDA at 1-800-FDA-1088. Where should I keep my  medication? This drug is given in a hospital or clinic and will not be stored at home. NOTE: This sheet is a summary. It may not cover all possible information. If you have questions about this medicine, talk to your doctor, pharmacist, or health care provider.  2022 Elsevier/Gold Standard (2020-10-03 00:00:00)

## 2021-02-23 ENCOUNTER — Telehealth: Payer: Self-pay | Admitting: Pulmonary Disease

## 2021-02-23 NOTE — Telephone Encounter (Signed)
Called [place] and there was no answer- LMTCB

## 2021-02-25 ENCOUNTER — Encounter: Payer: Self-pay | Admitting: Physical Medicine and Rehabilitation

## 2021-02-25 ENCOUNTER — Encounter: Payer: Self-pay | Admitting: Physician Assistant

## 2021-02-25 ENCOUNTER — Other Ambulatory Visit: Payer: Self-pay | Admitting: Physician Assistant

## 2021-02-25 ENCOUNTER — Other Ambulatory Visit: Payer: Self-pay | Admitting: Hematology and Oncology

## 2021-02-26 ENCOUNTER — Encounter: Payer: Self-pay | Admitting: Hematology and Oncology

## 2021-02-26 MED ORDER — LEVOTHYROXINE SODIUM 125 MCG PO TABS: 125.0000 ug | ORAL_TABLET | Freq: Every day | ORAL | 5 refills | Status: DC

## 2021-02-26 MED ORDER — ONDANSETRON HCL 8 MG PO TABS: 16.0000 mg | ORAL_TABLET | Freq: Two times a day (BID) | ORAL | 0 refills | Status: DC | PRN

## 2021-02-27 ENCOUNTER — Telehealth: Payer: Medicare HMO

## 2021-02-27 ENCOUNTER — Other Ambulatory Visit: Payer: Self-pay | Admitting: Hematology and Oncology

## 2021-02-28 ENCOUNTER — Encounter: Payer: Self-pay | Admitting: Hematology and Oncology

## 2021-03-05 ENCOUNTER — Encounter: Payer: Self-pay | Admitting: Physical Medicine and Rehabilitation

## 2021-03-05 ENCOUNTER — Encounter
Payer: No Typology Code available for payment source | Attending: Physical Medicine and Rehabilitation | Admitting: Physical Medicine and Rehabilitation

## 2021-03-05 ENCOUNTER — Other Ambulatory Visit: Payer: Self-pay

## 2021-03-05 ENCOUNTER — Other Ambulatory Visit: Payer: Self-pay | Admitting: Physical Medicine and Rehabilitation

## 2021-03-05 VITALS — BP 107/74 | HR 98 | Ht 62.0 in | Wt 197.8 lb

## 2021-03-05 DIAGNOSIS — R269 Unspecified abnormalities of gait and mobility: Secondary | ICD-10-CM

## 2021-03-05 DIAGNOSIS — F419 Anxiety disorder, unspecified: Secondary | ICD-10-CM | POA: Diagnosis not present

## 2021-03-05 DIAGNOSIS — Z79891 Long term (current) use of opiate analgesic: Secondary | ICD-10-CM | POA: Diagnosis not present

## 2021-03-05 DIAGNOSIS — C3490 Malignant neoplasm of unspecified part of unspecified bronchus or lung: Secondary | ICD-10-CM | POA: Diagnosis not present

## 2021-03-05 DIAGNOSIS — Z5181 Encounter for therapeutic drug level monitoring: Secondary | ICD-10-CM

## 2021-03-05 DIAGNOSIS — G894 Chronic pain syndrome: Secondary | ICD-10-CM

## 2021-03-05 DIAGNOSIS — F32A Depression, unspecified: Secondary | ICD-10-CM | POA: Diagnosis not present

## 2021-03-05 MED ORDER — BUPROPION HCL ER (SR) 150 MG PO TB12: 150.0000 mg | ORAL_TABLET | Freq: Two times a day (BID) | ORAL | 5 refills | Status: DC

## 2021-03-05 NOTE — Progress Notes (Signed)
Subjective:    Patient ID: [name], female    DOB: 05-06-1955, 66 y.o.   MRN: [id number]  HPI  Pt is a 66 yr old female with hx of newly dx'd COPD, still smoking, HTN, DM- diet controlled; anxiety and depression;  Scoliosis and deg back issues,  Generalized OA;  chronic pain- here for f/u of chronic pain issues. With new 2.2 cm R lower pulmonary nodule and hemorrhoids.  Now has dx of Small cell lung cancer extensive stage per oncology note   Here for f/u on chronic pain.   Has lost all hair- doesn't feel good today.   Has started immunotherapy- has had 1 treatment- for 3 months- then CT scan.   Understood has additional lesion locations- pancreatic head and next to duodenum and L lung as well. - main lesion in R lung-  Last CT- down to a shadow of original- responded very well to chemo-   Breathing- so- so- takings inhalers-  Was using Trilogy inhaler- so expensive with new insurance company, now only using albuterol, and not breathing as well.   Waking up at 1am every morning and cannot get back to sleep.  On Cymbalta 120 mg daily for mood.  Stays depressed a whole lot.   Nothing right in life. Should have never married husband- and 30 yr old grandson who lives with her/him, has big attitude.  If goes shopping, back hurts so bad- [employer] doesn't have benches anymore.     Social Hx: back up to 2 ppd. Enjoys smoking and feels like the damage is done- knows if quits, would add to her life.  Doesn't feel adding life will add to quality of life.   Pain Inventory Average Pain 7 Pain Right Now 8 My pain is sharp, burning, stabbing, and aching  In the last 24 hours, has pain interfered with the following? General activity 9 Relation with others 8 Enjoyment of life 10 What TIME of day is your pain at its worst? morning  Sleep (in general) Poor  Pain is worse with: walking, bending, and standing Pain improves with: medication and injections Relief from Meds:  8  Family History  Problem Relation Age of Onset   COPD Mother    Heart disease Mother    Lung disease Father        Asbestosis   Heart attack Father    Heart disease Father    Cerebral aneurysm Brother    Aneurysm Brother        Brain   Drug abuse Daughter    Epilepsy Son    Alcohol abuse Son    Drug abuse Son    Arthritis Maternal Grandmother    Heart disease Maternal Grandmother    Asthma Maternal Grandfather    Cancer Maternal Grandfather    Arthritis Paternal Grandmother    Heart disease Paternal Grandmother    Stroke Paternal Grandmother    Early death Paternal Grandfather    Heart disease Paternal Grandfather    Social History   Socioeconomic History   Marital status: Married    Spouse name: Not on file   Number of children: 2   Years of education: Not on file   Highest education level: Not on file  Occupational History   Occupation: disabled   Occupation: disabled  Tobacco Use   Smoking status: Every Day    Packs/day: 2.00    Years: 46.00    Pack years: 92.00    Types: Cigarettes   Smokeless  tobacco: Never   Tobacco comments:    Currently smoking 1.5ppd as of 11/10/20  Vaping Use   Vaping Use: Never used  Substance and Sexual Activity   Alcohol use: No   Drug use: No   Sexual activity: Not Currently    Partners: Male  Other Topics Concern   Not on file  Social History Narrative   Right handed    Caffeine~ 2 cups per day    Lives at home with husband (strained relationship)   Primary caretaker for disabled brother who had aneurism   Daughter died 06-17-2018    Social Determinants of Health   Financial Resource Strain: Not on file  Food Insecurity: Not on file  Transportation Needs: Not on file  Physical Activity: Not on file  Stress: Not on file  Social Connections: Not on file   Past Surgical History:  Procedure Laterality Date   APPENDECTOMY     1985   BIOPSY  07/24/2017   Procedure: BIOPSY;  Surgeon: Milus Banister, MD;  Location:  [facility];  Service: Endoscopy;;   CARDIAC CATHETERIZATION N/A 10/31/2015   Procedure: Left Heart Cath and Coronary Angiography;  Surgeon: Leonie Man, MD;  Location: [facility];  Service: Cardiovascular;  Laterality: N/A;   CARPAL TUNNEL RELEASE Left    CARPAL TUNNEL RELEASE Right    CHOLECYSTECTOMY  late 1980's   COLONOSCOPY WITH PROPOFOL N/A 07/24/2017   Procedure: COLONOSCOPY WITH PROPOFOL;  Surgeon: Milus Banister, MD;  Location: [facility];  Service: Endoscopy;  Laterality: N/A;   ESOPHAGOGASTRODUODENOSCOPY N/A 07/24/2017   Procedure: ESOPHAGOGASTRODUODENOSCOPY (EGD);  Surgeon: Milus Banister, MD;  Location: [facility];  Service: Endoscopy;  Laterality: N/A;   ESOPHAGOGASTRODUODENOSCOPY (EGD) WITH PROPOFOL N/A 10/19/2020   Procedure: ESOPHAGOGASTRODUODENOSCOPY (EGD) WITH PROPOFOL;  Surgeon: Milus Banister, MD;  Location: [facility];  Service: Endoscopy;  Laterality: N/A;   FINE NEEDLE ASPIRATION N/A 10/19/2020   Procedure: FINE NEEDLE ASPIRATION (FNA) LINEAR;  Surgeon: Milus Banister, MD;  Location: [facility];  Service: Endoscopy;  Laterality: N/A;   GALLBLADDER SURGERY  1991   HIP CLOSED REDUCTION Right 01/08/2016   Procedure: CLOSED MANIPULATION HIP;  Surgeon: Susa Day, MD;  Location: [facility];  Service: Orthopedics;  Laterality: Right;   HIP CLOSED REDUCTION Right 01/19/2016   Procedure: ATTEMPTED CLOSED REDUCTION RIGHT HIP;  Surgeon: Wylene Simmer, MD;  Location: [facility];  Service: Orthopedics;  Laterality: Right;   HIP CLOSED REDUCTION Right 01/20/2016   Procedure: CLOSED REDUCTION RIGHT TOTAL HIP;  Surgeon: Paralee Cancel, MD;  Location: [facility];  Service: Orthopedics;  Laterality: Right;   HIP CLOSED REDUCTION Right 02/17/2016   Procedure: CLOSED REDUCTION RIGHT TOTAL HIP;  Surgeon: Rod Can, MD;  Location: [facility];  Service: Orthopedics;  Laterality: Right;   HIP CLOSED REDUCTION Right 02/28/2016   Procedure: CLOSED REDUCTION HIP;  Surgeon: Nicholes Stairs, MD;  Location: [facility];  Service: Orthopedics;  Laterality: Right;   IR IMAGING GUIDED PORT INSERTION  11/01/2020   POLYPECTOMY  07/24/2017   Procedure: POLYPECTOMY;  Surgeon: Milus Banister, MD;  Location: [facility];  Service: Endoscopy;;   [place], with 1 ovary removed and 2 nd ovary removed 2003   TOTAL HIP ARTHROPLASTY Right    Original surgery 2006 with revision 2010   TOTAL HIP REVISION Right 01/01/2016   Procedure: TOTAL HIP REVISION;  Surgeon: Paralee Cancel, MD;  Location:  [facility];  Service: Orthopedics;  Laterality: Right;   TOTAL HIP REVISION Right 03/02/2016   Procedure: TOTAL HIP REVISION;  Surgeon: Paralee Cancel, MD;  Location: [facility];  Service: Orthopedics;  Laterality: Right;   TOTAL HIP REVISION Right 09/02/2016   Procedure: Right hip constrained liner- posterior;  Surgeon: Paralee Cancel, MD;  Location: [facility];  Service: Orthopedics;  Laterality: Right;   ULNAR NERVE TRANSPOSITION Right    UPPER ESOPHAGEAL ENDOSCOPIC ULTRASOUND (EUS) N/A 10/19/2020   Procedure: UPPER ESOPHAGEAL ENDOSCOPIC ULTRASOUND (EUS);  Surgeon: Milus Banister, MD;  Location: [facility];  Service: Endoscopy;  Laterality: N/A;  periduodenal lesion   Past Surgical History:  Procedure Laterality Date   APPENDECTOMY     1985   BIOPSY  07/24/2017   Procedure: BIOPSY;  Surgeon: Milus Banister, MD;  Location: [facility];  Service: Endoscopy;;   CARDIAC CATHETERIZATION N/A 10/31/2015   Procedure: Left Heart Cath and Coronary Angiography;  Surgeon: Leonie Man, MD;  Location: [facility];  Service: Cardiovascular;  Laterality: N/A;   CARPAL TUNNEL RELEASE Left    CARPAL TUNNEL RELEASE Right    CHOLECYSTECTOMY  late 1980's   COLONOSCOPY WITH PROPOFOL N/A 07/24/2017   Procedure: COLONOSCOPY WITH PROPOFOL;  Surgeon: Milus Banister, MD;  Location: [facility];  Service: Endoscopy;  Laterality: N/A;   ESOPHAGOGASTRODUODENOSCOPY N/A  07/24/2017   Procedure: ESOPHAGOGASTRODUODENOSCOPY (EGD);  Surgeon: Milus Banister, MD;  Location: [facility];  Service: Endoscopy;  Laterality: N/A;   ESOPHAGOGASTRODUODENOSCOPY (EGD) WITH PROPOFOL N/A 10/19/2020   Procedure: ESOPHAGOGASTRODUODENOSCOPY (EGD) WITH PROPOFOL;  Surgeon: Milus Banister, MD;  Location: [facility];  Service: Endoscopy;  Laterality: N/A;   FINE NEEDLE ASPIRATION N/A 10/19/2020   Procedure: FINE NEEDLE ASPIRATION (FNA) LINEAR;  Surgeon: Milus Banister, MD;  Location: [facility];  Service: Endoscopy;  Laterality: N/A;   GALLBLADDER SURGERY  1991   HIP CLOSED REDUCTION Right 01/08/2016   Procedure: CLOSED MANIPULATION HIP;  Surgeon: Susa Day, MD;  Location: [facility];  Service: Orthopedics;  Laterality: Right;   HIP CLOSED REDUCTION Right 01/19/2016   Procedure: ATTEMPTED CLOSED REDUCTION RIGHT HIP;  Surgeon: Wylene Simmer, MD;  Location: [facility];  Service: Orthopedics;  Laterality: Right;   HIP CLOSED REDUCTION Right 01/20/2016   Procedure: CLOSED REDUCTION RIGHT TOTAL HIP;  Surgeon: Paralee Cancel, MD;  Location: [facility];  Service: Orthopedics;  Laterality: Right;   HIP CLOSED REDUCTION Right 02/17/2016   Procedure: CLOSED REDUCTION RIGHT TOTAL HIP;  Surgeon: Rod Can, MD;  Location: [facility];  Service: Orthopedics;  Laterality: Right;   HIP CLOSED REDUCTION Right 02/28/2016   Procedure: CLOSED REDUCTION HIP;  Surgeon: Nicholes Stairs, MD;  Location: [facility];  Service: Orthopedics;  Laterality: Right;   IR IMAGING GUIDED PORT INSERTION  11/01/2020   POLYPECTOMY  07/24/2017   Procedure: POLYPECTOMY;  Surgeon: Milus Banister, MD;  Location: [facility];  Service: Endoscopy;;   [place], with 1 ovary removed and 2 nd ovary removed 2003   TOTAL HIP ARTHROPLASTY Right    Original surgery 2006 with revision 2010   TOTAL HIP REVISION Right 01/01/2016   Procedure: TOTAL HIP REVISION;  Surgeon: Paralee Cancel, MD;   Location: [facility];  Service: Orthopedics;  Laterality: Right;   TOTAL HIP REVISION Right 03/02/2016   Procedure: TOTAL HIP REVISION;  Surgeon: Paralee Cancel, MD;  Location: [facility];  Service: Orthopedics;  Laterality: Right;  TOTAL HIP REVISION Right 09/02/2016   Procedure: Right hip constrained liner- posterior;  Surgeon: Paralee Cancel, MD;  Location: [facility];  Service: Orthopedics;  Laterality: Right;   ULNAR NERVE TRANSPOSITION Right    UPPER ESOPHAGEAL ENDOSCOPIC ULTRASOUND (EUS) N/A 10/19/2020   Procedure: UPPER ESOPHAGEAL ENDOSCOPIC ULTRASOUND (EUS);  Surgeon: Milus Banister, MD;  Location: [facility];  Service: Endoscopy;  Laterality: N/A;  periduodenal lesion   Past Medical History:  Diagnosis Date   Allergic rhinitis    Anemia    Anxiety    Chicken pox    Chronic back pain    COPD (chronic obstructive pulmonary disease) (HCC)    Depression    DM (diabetes mellitus) (HCC)    Essential hypertension    GERD (gastroesophageal reflux disease)    Headache    migraines   History of gastritis    EGD 2015   History of home oxygen therapy    2 liters at hs last 6 months   Hyperlipidemia    Hypothyroidism    Migraines    Osteoarthritis    oa   Scoliosis    BP 107/74    Pulse 98    Ht 5\' 2"  (1.575 m)    Wt 197 lb 12.8 oz (89.7 kg)    SpO2 94%    BMI 36.18 kg/m   Opioid Risk Score:   Fall Risk Score:  `1  Depression screen PHQ 2/9  Depression screen [facility] 2/9 03/05/2021 03/05/2021 04/10/2020 12/27/2019 11/30/2019  Decreased Interest 0 0 2 0 2  Down, Depressed, Hopeless 0 0 1 2 1   PHQ - 2 Score 0 0 3 2 3   Altered sleeping - - 0 1 0  Tired, decreased energy - - 2 1 2   Change in appetite - - 0 1 0  Feeling bad or failure about yourself  - - 1 1 0  Trouble concentrating - - 1 0 0  Moving slowly or fidgety/restless - - 0 0 0  Suicidal thoughts - - 0 0 0  PHQ-9 Score - - 7 6 5   Difficult doing work/chores - - - Somewhat difficult Somewhat difficult  Some recent data might be hidden      Review of Systems  Constitutional: Negative.   HENT: Negative.    Eyes: Negative.   Respiratory: Negative.    Cardiovascular: Negative.   Gastrointestinal: Negative.   Endocrine: Negative.   Genitourinary: Negative.   Musculoskeletal:  Positive for back pain and gait problem.  Skin: Negative.   Allergic/Immunologic: Negative.   Hematological: Negative.   Psychiatric/Behavioral:  Positive for sleep disturbance.       Objective:   Physical Exam Awake, alert, depressed; no hair; O2 sats 94$ , NAD TTP across lo back- in band across low back  Hunched posture      Assessment & Plan:   Pt is a 66 yr old female with hx of newly dx'd COPD, still smoking, HTN, DM- diet controlled; anxiety and depression;  Scoliosis and deg back issues,  Generalized OA;  chronic pain- here for f/u of chronic pain issues. With new 2.2 cm R lower pulmonary nodule and hemorrhoids.  Now has dx of Small cell lung cancer extensive stage per oncology note  Still smoking- hasn't really slowed down. Doesn't want to increase length of life.    Will need to speak with Pulmonary doctor about Trilogy since worked so well.   2. Would be interested and appropriate to get 4 wheeled walker with seat- a  rollator- due to pt's very poor endurance from cancer and abnormal gait- and need to frequent rest breaks. [facility] to see if [facility]. Written Rx given. Will need due to seat for low endurance and to carry O2- needs rollator.   3. Our goal-  to get pt out of house more.    4. Just refilled Pain meds- Norco this AM- will increase Norco to 5 tabs/day- has Rx for 4x/day just sent this AM- take 5x/day and call me when current Rx will run out and will refill at 5x/day.   5. Add Wellbutrin 150 mg BID- take in AM and dinner time-  waking up early in AM due to depression.   6. Continue Cymbalta 120 mg daily. For nerve pain   7. F/U in 3 months- let me know how you are doing prior to then, please  I  spent a total of  32  minutes on total care today- >50% coordination of care- due to grief and depression as well as lung cancer.

## 2021-03-05 NOTE — Patient Instructions (Signed)
Pt is a 66 yr old female with hx of newly dx'd COPD, still smoking, HTN, DM- diet controlled; anxiety and depression;  Scoliosis and deg back issues,  Generalized OA;  chronic pain- here for f/u of chronic pain issues. With new 2.2 cm R lower pulmonary nodule and hemorrhoids.  Now has dx of Small cell lung cancer extensive stage per oncology note  Still smoking- hasn't really slowed down.    Will need to speak with Pulmonary doctor about Trilogy since worked so well.   2. Would be interested and appropriate to get 4 wheeled walker with seat- a rollator- due to pt's very poor endurance from cancer and abnormal gait- and need to frequent rest breaks. [facility] to see if [facility]. Written Rx given. Will need due to seat for low endurance and to carry O2- needs rollator.   3. Our goal-  to get pt out of house more.    4. Just refilled Pain meds- Norco this AM- will increase Norco to 5 tabs/day- has Rx for 4x/day just sent this AM- take 5x/day and call me when current Rx will run out and will refill at 5x/day.   5. Add Wellbutrin 150 mg BID- take in AM and dinner time-  waking up early in AM due to depression.   6. Continue Cymbalta 120 mg daily. For nerve pain   7. F/U in 3 months- let me know how you are doing prior to then, please

## 2021-03-09 ENCOUNTER — Telehealth: Payer: Self-pay | Admitting: Pulmonary Disease

## 2021-03-09 NOTE — Telephone Encounter (Signed)
We do have the fax  I called and let them know it should be signed and faxed back sometime next wk when JE back in the office  Nothing further needed

## 2021-03-14 ENCOUNTER — Inpatient Hospital Stay: Payer: No Typology Code available for payment source

## 2021-03-14 ENCOUNTER — Inpatient Hospital Stay: Payer: No Typology Code available for payment source | Attending: Hematology and Oncology

## 2021-03-14 ENCOUNTER — Other Ambulatory Visit: Payer: Self-pay

## 2021-03-14 ENCOUNTER — Inpatient Hospital Stay (HOSPITAL_BASED_OUTPATIENT_CLINIC_OR_DEPARTMENT_OTHER): Payer: No Typology Code available for payment source | Admitting: Physician Assistant

## 2021-03-14 VITALS — BP 148/77 | HR 83 | Temp 97.8°F | Resp 19 | Ht 62.0 in | Wt 200.4 lb

## 2021-03-14 DIAGNOSIS — E119 Type 2 diabetes mellitus without complications: Secondary | ICD-10-CM | POA: Diagnosis not present

## 2021-03-14 DIAGNOSIS — E785 Hyperlipidemia, unspecified: Secondary | ICD-10-CM | POA: Insufficient documentation

## 2021-03-14 DIAGNOSIS — Z886 Allergy status to analgesic agent status: Secondary | ICD-10-CM | POA: Diagnosis not present

## 2021-03-14 DIAGNOSIS — Z5112 Encounter for antineoplastic immunotherapy: Secondary | ICD-10-CM | POA: Insufficient documentation

## 2021-03-14 DIAGNOSIS — E039 Hypothyroidism, unspecified: Secondary | ICD-10-CM | POA: Insufficient documentation

## 2021-03-14 DIAGNOSIS — F32A Depression, unspecified: Secondary | ICD-10-CM | POA: Insufficient documentation

## 2021-03-14 DIAGNOSIS — C3490 Malignant neoplasm of unspecified part of unspecified bronchus or lung: Secondary | ICD-10-CM

## 2021-03-14 DIAGNOSIS — C3431 Malignant neoplasm of lower lobe, right bronchus or lung: Secondary | ICD-10-CM | POA: Insufficient documentation

## 2021-03-14 DIAGNOSIS — F419 Anxiety disorder, unspecified: Secondary | ICD-10-CM | POA: Diagnosis not present

## 2021-03-14 DIAGNOSIS — R7881 Bacteremia: Secondary | ICD-10-CM | POA: Insufficient documentation

## 2021-03-14 DIAGNOSIS — Z90721 Acquired absence of ovaries, unilateral: Secondary | ICD-10-CM | POA: Diagnosis not present

## 2021-03-14 DIAGNOSIS — J449 Chronic obstructive pulmonary disease, unspecified: Secondary | ICD-10-CM | POA: Diagnosis not present

## 2021-03-14 DIAGNOSIS — Z79899 Other long term (current) drug therapy: Secondary | ICD-10-CM | POA: Insufficient documentation

## 2021-03-14 DIAGNOSIS — C771 Secondary and unspecified malignant neoplasm of intrathoracic lymph nodes: Secondary | ICD-10-CM | POA: Diagnosis not present

## 2021-03-14 DIAGNOSIS — Z880 Allergy status to penicillin: Secondary | ICD-10-CM | POA: Diagnosis not present

## 2021-03-14 DIAGNOSIS — G4700 Insomnia, unspecified: Secondary | ICD-10-CM | POA: Diagnosis not present

## 2021-03-14 DIAGNOSIS — Z87891 Personal history of nicotine dependence: Secondary | ICD-10-CM | POA: Diagnosis not present

## 2021-03-14 DIAGNOSIS — Z885 Allergy status to narcotic agent status: Secondary | ICD-10-CM | POA: Insufficient documentation

## 2021-03-14 DIAGNOSIS — Z809 Family history of malignant neoplasm, unspecified: Secondary | ICD-10-CM | POA: Insufficient documentation

## 2021-03-14 DIAGNOSIS — I10 Essential (primary) hypertension: Secondary | ICD-10-CM | POA: Diagnosis not present

## 2021-03-14 DIAGNOSIS — Z8261 Family history of arthritis: Secondary | ICD-10-CM | POA: Insufficient documentation

## 2021-03-14 DIAGNOSIS — D649 Anemia, unspecified: Secondary | ICD-10-CM | POA: Diagnosis not present

## 2021-03-14 DIAGNOSIS — Z9049 Acquired absence of other specified parts of digestive tract: Secondary | ICD-10-CM | POA: Diagnosis not present

## 2021-03-14 DIAGNOSIS — Z8249 Family history of ischemic heart disease and other diseases of the circulatory system: Secondary | ICD-10-CM | POA: Insufficient documentation

## 2021-03-14 DIAGNOSIS — Z836 Family history of other diseases of the respiratory system: Secondary | ICD-10-CM | POA: Insufficient documentation

## 2021-03-14 DIAGNOSIS — Z882 Allergy status to sulfonamides status: Secondary | ICD-10-CM | POA: Insufficient documentation

## 2021-03-14 DIAGNOSIS — Z820 Family history of epilepsy and other diseases of the nervous system: Secondary | ICD-10-CM | POA: Insufficient documentation

## 2021-03-14 DIAGNOSIS — Z8719 Personal history of other diseases of the digestive system: Secondary | ICD-10-CM | POA: Insufficient documentation

## 2021-03-14 DIAGNOSIS — Z95828 Presence of other vascular implants and grafts: Secondary | ICD-10-CM

## 2021-03-14 DIAGNOSIS — R110 Nausea: Secondary | ICD-10-CM | POA: Diagnosis not present

## 2021-03-14 DIAGNOSIS — Z814 Family history of other substance abuse and dependence: Secondary | ICD-10-CM | POA: Insufficient documentation

## 2021-03-14 DIAGNOSIS — Z823 Family history of stroke: Secondary | ICD-10-CM | POA: Insufficient documentation

## 2021-03-14 LAB — CMP (CANCER CENTER ONLY)
ALT: 8 U/L (ref 0–44)
AST: 20 U/L (ref 15–41)
Albumin: 3.4 g/dL — ABNORMAL LOW (ref 3.5–5.0)
Alkaline Phosphatase: 78 U/L (ref 38–126)
Anion gap: 5 (ref 5–15)
BUN: 8 mg/dL (ref 8–23)
CO2: 28 mmol/L (ref 22–32)
Calcium: 8.6 mg/dL — ABNORMAL LOW (ref 8.9–10.3)
Chloride: 103 mmol/L (ref 98–111)
Creatinine: 0.84 mg/dL (ref 0.44–1.00)
GFR, Estimated: >60 mL/min (ref >60)
Glucose, Bld: 95 mg/dL (ref 70–99)
Potassium: 3.8 mmol/L (ref 3.5–5.1)
Sodium: 136 mmol/L (ref 135–145)
Total Bilirubin: 0.4 mg/dL (ref 0.3–1.2)
Total Protein: 6.2 g/dL — ABNORMAL LOW (ref 6.5–8.1)

## 2021-03-14 LAB — CBC WITH DIFFERENTIAL (CANCER CENTER ONLY)
Abs Immature Granulocytes: 0.02 10*3/uL (ref 0.00–0.07)
Basophils Absolute: 0.1 10*3/uL (ref 0.0–0.1)
Basophils Relative: 1 %
Eosinophils Absolute: 1.1 10*3/uL — ABNORMAL HIGH (ref 0.0–0.5)
Eosinophils Relative: 12 %
HCT: 35.2 % — ABNORMAL LOW (ref 36.0–46.0)
Hemoglobin: 10.6 g/dL — ABNORMAL LOW (ref 12.0–15.0)
Immature Granulocytes: 0 %
Lymphocytes Relative: 26 %
Lymphs Abs: 2.4 10*3/uL (ref 0.7–4.0)
MCH: 29.2 pg (ref 26.0–34.0)
MCHC: 30.1 g/dL (ref 30.0–36.0)
MCV: 97 fL (ref 80.0–100.0)
Monocytes Absolute: 0.7 10*3/uL (ref 0.1–1.0)
Monocytes Relative: 7 %
Neutro Abs: 4.9 10*3/uL (ref 1.7–7.7)
Neutrophils Relative %: 54 %
Platelet Count: 146 10*3/uL — ABNORMAL LOW (ref 150–400)
RBC: 3.63 MIL/uL — ABNORMAL LOW (ref 3.87–5.11)
RDW: 18.7 % — ABNORMAL HIGH (ref 11.5–15.5)
WBC Count: 9.1 10*3/uL (ref 4.0–10.5)
nRBC: 0 % (ref 0.0–0.2)

## 2021-03-14 LAB — TSH: TSH: 0.727 u[IU]/mL (ref 0.308–3.960)

## 2021-03-14 MED ORDER — SODIUM CHLORIDE 0.9% FLUSH
10.0000 mL | Freq: Once | INTRAVENOUS | Status: AC
  Administered 2021-03-14: 10 mL

## 2021-03-14 MED ORDER — SODIUM CHLORIDE 0.9 % IV SOLN
1200.0000 mg | Freq: Once | INTRAVENOUS | Status: AC
  Administered 2021-03-14: 1200 mg via INTRAVENOUS
  Filled 2021-03-14: qty 20

## 2021-03-14 MED ORDER — SODIUM CHLORIDE 0.9 % IV SOLN: Freq: Once | INTRAVENOUS | Status: DC

## 2021-03-14 MED ORDER — ONDANSETRON HCL 8 MG PO TABS
8.0000 mg | ORAL_TABLET | Freq: Once | ORAL | Status: AC
  Administered 2021-03-14: 8 mg via ORAL
  Filled 2021-03-14: qty 1

## 2021-03-14 NOTE — Progress Notes (Signed)
[place] [phone]   [phone]  PROGRESS NOTE  Patient Care Team: Allwardt, Randa Evens, PA-C as PCP - General (Physician Assistant) Milus Banister, MD as Attending Physician (Gastroenterology) Dr. Darylene Price, MD as Consulting Physician (Orthopedic Surgery) Latanya Maudlin, MD as Consulting Physician (Orthopedic Surgery) Kathrynn Ducking, MD (Inactive) as Consulting Physician (Neurology) Okey Regal, [place] as Consulting Physician (Optometry) [name], [facility] as Pharmacist (Pharmacist) Cameron Sprang, MD as Consulting Physician (Neurology) Valrie Hart, RN as Oncology Nurse Navigator (Oncology) Leona Singleton, RN as [facility] Management  Hematological/Oncological History # Small Cell Lung Cancer, Extensive Stage 07/05/2020: CT abdomen for lower abdominal pain. New pulmonary nodular density noted 07/06/2020: CT chest showed 2.2 cm macrolobulated right lower lobe pulmonary nodule (favored) versus pathologically enlarged infrahilar lymph node 10/13/2020: PET CT scan performed, findings show 2 cm right lower lobe lung mass is hypermetabolic and consistent with primary lung neoplasm. Additionally found hypermetabolic 17 mm soft tissue lesion between the descending duodenum and the pancreatic head  10/19/2020: EGD to biopsy hypermetabolic lymph node. Biopsy results show small cell lung cancer 10/26/2020: establish care with Dr. Lorenso Courier  11/13/2020: Cycle 1 Day 1 of Carbo/Etop/Atezolizumab 12/04/2020: Cycle 2 Day 1 of Carbo/Etop/Atezolizumab 11/22-11/25/2022: admitted for E. Coli bacteremia/sepsis. Start of Cycle 3 delayed. 01/02/2021: Cycle 3 Day 1 of Carbo/Etop/Atezolizumab 01/23/2021: Cycle 4 Day 1 of Carbo/Etop/Atezolizumab 02/22/2021: Cycle 5 Day 1 of Atezolizumab Maintenance. Delayed start due to patient's COVID infection.  03/14/2021: Cycle 6 Day 1 of Atezolizumab Maintenance  Interval History:  [name]  66 y.o. female with medical history significant for extensive stage small cell lung cancer who presents for a follow up visit. The patient's last visit was on 02/22/2021. In the interim since the last visit she has completed cycle 5 of chemotherapy. She presents today to start cycle 6 of chemotherapy.  On exam today [name] reports persistent fatigue and lack of desire to do her daily activities.  She contributes this to ongoing family stressors.  Additionally, she reports insomnia with difficulty staying asleep.  Patient was prescribed Wellbutrin to help with insomnia but discontinued as it was causing excessive sedation throughout the day.  Patient has a good appetite without any recent weight changes.  She has persistent postprandial nausea she takes Zofran as needed.  She denies any vomiting episodes.  Patient reports constipation that resolves on its own.  She has a bowel movement almost every day.  Denies easy bruising or signs of active bleeding.  Patient reports improvement of dry cough with Tessalon Perles.  She has ongoing shortness of breath at rest and exertion.  She is currently on albuterol inhalers with some improvement.  She is scheduled to follow-up with her pulmonologist to discuss additional therapies.  She denies fevers, chills, night sweats, chest pain, neuropathy or peripheral edema.  She has no other complaints. A full 10 point ROS is listed below.   MEDICAL HISTORY:  Past Medical History:  Diagnosis Date   Allergic rhinitis    Anemia    Anxiety    Chicken pox    Chronic back pain    COPD (chronic obstructive pulmonary disease) (HCC)    Depression    DM (diabetes mellitus) ([place])    Essential hypertension    GERD (gastroesophageal reflux disease)    Headache    migraines   History of gastritis    EGD 2015   History of home oxygen therapy  2 liters at hs last 6 months   Hyperlipidemia    Hypothyroidism    Migraines    Osteoarthritis    oa   Scoliosis      SURGICAL HISTORY: Past Surgical History:  Procedure Laterality Date   APPENDECTOMY     1985   BIOPSY  07/24/2017   Procedure: BIOPSY;  Surgeon: Milus Banister, MD;  Location: [facility];  Service: Endoscopy;;   CARDIAC CATHETERIZATION N/A 10/31/2015   Procedure: Left Heart Cath and Coronary Angiography;  Surgeon: Leonie Man, MD;  Location: [facility];  Service: Cardiovascular;  Laterality: N/A;   CARPAL TUNNEL RELEASE Left    CARPAL TUNNEL RELEASE Right    CHOLECYSTECTOMY  late 1980's   COLONOSCOPY WITH PROPOFOL N/A 07/24/2017   Procedure: COLONOSCOPY WITH PROPOFOL;  Surgeon: Milus Banister, MD;  [facility]: [facility];  Service: Endoscopy;  Laterality: N/A;   ESOPHAGOGASTRODUODENOSCOPY N/A 07/24/2017   Procedure: ESOPHAGOGASTRODUODENOSCOPY (EGD);  Surgeon: Milus Banister, MD;  Location: [facility];  Service: Endoscopy;  Laterality: N/A;   ESOPHAGOGASTRODUODENOSCOPY (EGD) WITH PROPOFOL N/A 10/19/2020   Procedure: ESOPHAGOGASTRODUODENOSCOPY (EGD) WITH PROPOFOL;  Surgeon: Milus Banister, MD;  [facility]: [facility];  Service: Endoscopy;  Laterality: N/A;   FINE NEEDLE ASPIRATION N/A 10/19/2020   Procedure: FINE NEEDLE ASPIRATION (FNA) LINEAR;  Surgeon: Milus Banister, MD;  [facility]: [facility];  Service: Endoscopy;  Laterality: N/A;   GALLBLADDER SURGERY  1991   HIP CLOSED REDUCTION Right 01/08/2016   Procedure: CLOSED MANIPULATION HIP;  Surgeon: Susa Day, MD;  Location: [facility];  Service: Orthopedics;  Laterality: Right;   HIP CLOSED REDUCTION Right 01/19/2016   Procedure: ATTEMPTED CLOSED REDUCTION RIGHT HIP;  Surgeon: Wylene Simmer, MD;  Location: [facility];  Service: Orthopedics;  Laterality: Right;   HIP CLOSED REDUCTION Right 01/20/2016   Procedure: CLOSED REDUCTION RIGHT TOTAL HIP;  Surgeon: Paralee Cancel, MD;  Location: [facility];  Service: Orthopedics;  Laterality: Right;   HIP CLOSED REDUCTION Right 02/17/2016   Procedure: CLOSED REDUCTION RIGHT TOTAL HIP;   Surgeon: Rod Can, MD;  Location: [facility];  Service: Orthopedics;  Laterality: Right;   HIP CLOSED REDUCTION Right 02/28/2016   Procedure: CLOSED REDUCTION HIP;  Surgeon: Nicholes Stairs, MD;  Location: [facility];  Service: Orthopedics;  Laterality: Right;   IR IMAGING GUIDED PORT INSERTION  11/01/2020   POLYPECTOMY  07/24/2017   Procedure: POLYPECTOMY;  Surgeon: Milus Banister, MD;  Location: [facility];  Service: Endoscopy;;   [place], with 1 ovary removed and 2 nd ovary removed 2003   TOTAL HIP ARTHROPLASTY Right    Original surgery 2006 with revision 2010   TOTAL HIP REVISION Right 01/01/2016   Procedure: TOTAL HIP REVISION;  Surgeon: Paralee Cancel, MD;  Location: [facility];  Service: Orthopedics;  Laterality: Right;   TOTAL HIP REVISION Right 03/02/2016   Procedure: TOTAL HIP REVISION;  Surgeon: Paralee Cancel, MD;  Location: [facility];  Service: Orthopedics;  Laterality: Right;   TOTAL HIP REVISION Right 09/02/2016   Procedure: Right hip constrained liner- posterior;  Surgeon: Paralee Cancel, MD;  Location: [facility];  Service: Orthopedics;  Laterality: Right;   ULNAR NERVE TRANSPOSITION Right    UPPER ESOPHAGEAL ENDOSCOPIC ULTRASOUND (EUS) N/A 10/19/2020   Procedure: UPPER ESOPHAGEAL ENDOSCOPIC ULTRASOUND (EUS);  Surgeon: Milus Banister, MD;  Location: [facility];  Service: Endoscopy;  Laterality: N/A;  periduodenal lesion  SOCIAL HISTORY: Social History   Socioeconomic History   Marital status: Married    Spouse name: Not on file   Number of children: 2   Years of education: Not on file   Highest education level: Not on file  Occupational History   Occupation: disabled   Occupation: disabled  Tobacco Use   Smoking status: Every Day    Packs/day: 2.00    Years: 46.00    Pack years: 92.00    Types: Cigarettes   Smokeless tobacco: Never   Tobacco comments:    Currently smoking 1.5ppd as of 11/10/20  Vaping Use   Vaping Use:  Never used  Substance and Sexual Activity   Alcohol use: No   Drug use: No   Sexual activity: Not Currently    Partners: Male  Other Topics Concern   Not on file  Social History Narrative   Right handed    Caffeine~ 2 cups per day    Lives at home with husband (strained relationship)   Primary caretaker for disabled brother who had aneurism   Daughter died 2018/07/06    Social Determinants of Radio broadcast assistant Strain: Not on file  Food Insecurity: Not on file  Transportation Needs: Not on file  Physical Activity: Not on file  Stress: Not on file  Social Connections: Not on file  Intimate Partner Violence: Not on file    FAMILY HISTORY: Family History  Problem Relation Age of Onset   COPD Mother    Heart disease Mother    Lung disease Father        Asbestosis   Heart attack Father    Heart disease Father    Cerebral aneurysm Brother    Aneurysm Brother        Brain   Drug abuse Daughter    Epilepsy Son    Alcohol abuse Son    Drug abuse Son    Arthritis Maternal Grandmother    Heart disease Maternal Grandmother    Asthma Maternal Grandfather    Cancer Maternal Grandfather    Arthritis Paternal Grandmother    Heart disease Paternal Grandmother    Stroke Paternal Grandmother    Early death Paternal Grandfather    Heart disease Paternal Grandfather     ALLERGIES:  is allergic to metformin and related, nsaids, wellbutrin [bupropion], aleve [naproxen sodium], codeine, penicillins, and sulfonamide derivatives.  MEDICATIONS:  Current Outpatient Medications  Medication Sig Dispense Refill   albuterol (PROAIR HFA) 108 (90 Base) MCG/ACT inhaler 2 puffs every 4 hours as needed only  if your can't catch your breath 18 g 3   ALPRAZolam (XANAX) 1 MG tablet Take 1 tablet po TID as needed for anxiety. 90 tablet 2   aspirin EC 81 MG tablet Take 1 tablet (81 mg total) by mouth daily. Swallow whole. 90 tablet 3   atorvastatin (LIPITOR) 20 MG tablet TAKE 1 TABLET BY  MOUTH AT BEDTIME 90 tablet 1   benzonatate (TESSALON) 100 MG capsule TAKE 1 CAPSULE BY MOUTH THREE TIMES DAILY 30 capsule 1   buPROPion (WELLBUTRIN SR) 150 MG 12 hr tablet Take 1 tablet (150 mg total) by mouth 2 (two) times daily. For mood- take with Cymbalta 60 tablet 5   cyclobenzaprine (FLEXERIL) 10 MG tablet Take 1 tablet (10 mg total) by mouth 3 (three) times daily as needed for muscle spasms. 90 tablet 5   DULoxetine (CYMBALTA) 60 MG capsule TAKE TWO CAPSULES BY MOUTH DAILY 60 capsule 5  fluticasone (FLONASE) 50 MCG/ACT nasal spray Place 2 sprays into both nostrils daily. 16 g 6   Fluticasone-Umeclidin-Vilant (TRELEGY ELLIPTA) 200-62.5-25 MCG/ACT AEPB Inhale 1 puff into the lungs daily. 60 each 11   furosemide (LASIX) 20 MG tablet TAKE 1 TABLET BY MOUTH EVERY DAY AS NEEDED 30 tablet 90   HYDROcodone-acetaminophen (NORCO) 10-325 MG tablet TAKE 1 TABLET BY MOUTH EVERY 6 HOURS AS NEEDED 120 tablet 0   levothyroxine (SYNTHROID) 125 MCG tablet Take 1 tablet (125 mcg total) by mouth daily before breakfast. 30 tablet 5   loratadine (CLARITIN) 10 MG tablet Take 10 mg by mouth daily.     omeprazole (PRILOSEC) 40 MG capsule TAKE ONE CAPSULE BY MOUTH TWICE DAILY 60 capsule 2   ondansetron (ZOFRAN) 8 MG tablet Take 2 tablets (16 mg total) by mouth 2 (two) times daily as needed. 60 tablet 0   OXYGEN Inhale 3 L into the lungs at bedtime. Uses At Night     potassium chloride (KLOR-CON M) 10 MEQ tablet TAKE 1 TABLET BY MOUTH AT BEDTIME 90 tablet 1   prochlorperazine (COMPAZINE) 10 MG tablet Take 1 tablet (10 mg total) by mouth every 6 (six) hours as needed for nausea or vomiting. 30 tablet 0   triamterene-hydrochlorothiazide (MAXZIDE-25) 37.5-25 MG tablet TAKE 1 TABLET BY MOUTH DAILY - EMERGENCY REFILL FAXED DR. 30 tablet 0   No current facility-administered medications for this visit.   Facility-Administered Medications Ordered in Other Visits  Medication Dose Route Frequency Provider Last Rate Last  Admin   0.9 %  sodium chloride infusion   Intravenous Once Orson Slick, MD       atezolizumab (TECENTRIQ) 1,200 mg in sodium chloride 0.9 % 250 mL chemo infusion  1,200 mg Intravenous Once Ledell Peoples IV, MD 540 mL/hr at 03/14/21 1431 1,200 mg at 03/14/21 1431    REVIEW OF SYSTEMS:   Constitutional: ( - ) fevers, ( - )  chills , ( - ) night sweats Eyes: ( - ) blurriness of vision, ( - ) double vision, ( - ) watery eyes Ears, nose, mouth, throat, and face: ( - ) mucositis, ( - ) sore throat Respiratory: ( - ) cough, ( +) dyspnea, ( - ) wheezes Cardiovascular: ( - ) palpitation, ( - ) chest discomfort, ( - ) lower extremity swelling Gastrointestinal:  ( +) nausea, ( - ) heartburn, ( - ) change in bowel habits Skin: ( - ) abnormal skin rashes Lymphatics: ( - ) new lymphadenopathy, ( - ) easy bruising Neurological: ( - ) numbness, ( - ) tingling, ( - ) new weaknesses Behavioral/Psych: ( - ) mood change, ( - ) new changes  All other systems were reviewed with the patient and are negative.  PHYSICAL EXAMINATION: ECOG PERFORMANCE STATUS: 1 - Symptomatic but completely ambulatory  Vitals:   03/14/21 1248  BP: (!) 148/77  Pulse: 83  Resp: 19  Temp: 97.8 F (36.6 C)  SpO2: 94%    Filed Weights   03/14/21 1248  Weight: 200 lb 6.4 oz (90.9 kg)     GENERAL: Well-appearing middle-age Caucasian female, alert, no distress and comfortable SKIN: skin color, texture, turgor are normal, no rashes or significant lesions EYES: conjunctiva are pink and non-injected, sclera clear LUNGS:  normal breathing effort. Diffuse wheezing hear on ausculation.  HEART: regular rate & rhythm and no murmurs and no lower extremity edema Musculoskeletal: no cyanosis of digits and no clubbing  PSYCH: alert & oriented x 3,  fluent speech NEURO: no focal motor/sensory deficits  LABORATORY DATA:  I have reviewed the data as listed CBC Latest Ref Rng & Units 03/14/2021 02/22/2021 01/23/2021  WBC 4.0 - 10.5  K/uL 9.1 9.7 9.9  Hemoglobin 12.0 - 15.0 g/dL 10.6(L) 8.5(L) 9.6(L)  Hematocrit 36.0 - 46.0 % 35.2(L) 28.3(L) 30.6(L)  Platelets 150 - 400 K/uL 146(L) 313 244    CMP Latest Ref Rng & Units 03/14/2021 02/22/2021 01/23/2021  Glucose 70 - 99 mg/dL 95 94 91  BUN 8 - 23 mg/dL 8 10 8   Creatinine 0.44 - 1.00 mg/dL 0.84 0.94 0.82  Sodium 135 - 145 mmol/L 136 136 138  Potassium 3.5 - 5.1 mmol/L 3.8 4.2 3.9  Chloride 98 - 111 mmol/L 103 103 104  CO2 22 - 32 mmol/L 28 25 26   Calcium 8.9 - 10.3 mg/dL 8.6(L) 8.8(L) 8.7(L)  Total Protein 6.5 - 8.1 g/dL 6.2(L) 6.9 6.2(L)  Total Bilirubin 0.3 - 1.2 mg/dL 0.4 0.5 0.4  Alkaline Phos 38 - 126 U/L 78 102 107  AST 15 - 41 U/L 20 16 15   ALT 0 - 44 U/L 8 10 11     No results found for: MPROTEIN Lab Results  Component Value Date   KPAFRELGTCHN 0.75 06/30/2014   LAMBDASER 3.78 (H) 06/30/2014   KAPLAMBRATIO 0.20 (L) 06/30/2014     RADIOGRAPHIC STUDIES: No results found.  ASSESSMENT & PLAN Salia T Wisler 66 y.o. female with medical history significant for extensive stage small cell lung cancer who presents for a follow up visit.   After review of the labs, review of the records, and discussion with the patient the patients findings are most consistent with extensive stage small cell lung cancer with metastasis from the right lower lobe to the lymph nodes of the abdomen.  At this time we will pursue triple therapy with carboplatin, etoposide, and atezolizumab.  After 4 cycles we will convert to maintenance atezolizumab alone.  We previously discussed the risks and benefits of this therapy and the patient was in agreement to proceed with this treatment.  The treatment of choice consist of carboplatin, etoposide, and atezolizumab.  The regimen consists of carboplatin AUC of 5 IV on day 1, etoposide 100 mg per metered squared IV on day 1, 2, and 3 and atezolizumab 1200 mg on day 1.  This continues for 21-day cycles.  After 4 cycles the patient proceeds with  atezolizumab maintenance therapy alone.   #Small Cell Lung Cancer, Extensive Stage -- MRI of the brain shows no evidence of intracranial spread --Findings are currently consistent with metastatic small cell lung cancer with metastatic spread to the lymph nodes of the abdomen --Prognosis is poor with anticipated life span of less than 12 months -- Patient has extensive stage due to metastatic spread to lymph nodes in the abdomen --Plan to proceed with carboplatin, etoposide, and atezolizumab --will consider prophylactic cranial radiation after start of chemotherapy.  Plan: --today is Cycle 6 Day 1 of Atezolizumab maintenance --labs today were reviewed and adequate for treatment. Anemia has improved with Hgb 10.6. Mild thrombocytopenia with Platelet 146K. No intervention needed.  --CT scan q 3 months. This will be due in March 2023. Last scan in Dec 2022 showed an excellent response to therapy.  --RTC in 3 weeks for next cycle of maintenance atezolizumab    #Supportive Care -- chemotherapy education complete -- port placed -- zofran 8mg  q8H PRN and compazine 10mg  PO E1D for nausea -- EMLA cream for port -- no pain  medication required at this time.   #Post prandial nausea: --Recommended to try zofran before her meals to help with nausea.  No orders of the defined types were placed in this encounter.   All questions were answered. The patient knows to call the clinic with any problems, questions or concerns.  I have spent a total of 30 minutes minutes of face-to-face and non-face-to-face time, preparing to see the patient,  performing a medically appropriate examination, counseling and educating the patient, ordering medications/test, documenting clinical information in the electronic health record, and care coordination.   Dede Query PA-C Dept of Hematology and [place] at [facility] Phone: [phone]   03/14/2021 2:53 PM

## 2021-03-14 NOTE — Patient Instructions (Signed)
[facility]  Discharge Instructions: Thank you for choosing [facility] to provide your oncology and hematology care.   If you have a lab appointment with the [facility], please go directly to the [facility] and check in at the registration area.   Wear comfortable clothing and clothing appropriate for easy access to any Portacath or PICC line.   We strive to give you quality time with your provider. You may need to reschedule your appointment if you arrive late (15 or more minutes).  Arriving late affects you and other patients whose appointments are after yours.  Also, if you miss three or more appointments without notifying the office, you may be dismissed from the clinic at the providers discretion.      For prescription refill requests, have your pharmacy contact our office and allow 72 hours for refills to be completed.    Today you received the following chemotherapy and/or immunotherapy agents: Atezolizumab   To help prevent nausea and vomiting after your treatment, we encourage you to take your nausea medication as directed.  BELOW ARE SYMPTOMS THAT SHOULD BE REPORTED IMMEDIATELY: *FEVER GREATER THAN 100.4 F (38 C) OR HIGHER *CHILLS OR SWEATING *NAUSEA AND VOMITING THAT IS NOT CONTROLLED WITH YOUR NAUSEA MEDICATION *UNUSUAL SHORTNESS OF BREATH *UNUSUAL BRUISING OR BLEEDING *URINARY PROBLEMS (pain or burning when urinating, or frequent urination) *BOWEL PROBLEMS (unusual diarrhea, constipation, pain near the anus) TENDERNESS IN MOUTH AND THROAT WITH OR WITHOUT PRESENCE OF ULCERS (sore throat, sores in mouth, or a toothache) UNUSUAL RASH, SWELLING OR PAIN  UNUSUAL VAGINAL DISCHARGE OR ITCHING   Items with * indicate a potential emergency and should be followed up as soon as possible or go to the Emergency Department if any problems should occur.  Please show the CHEMOTHERAPY ALERT CARD or IMMUNOTHERAPY ALERT CARD at check-in to  the Emergency Department and triage nurse.  Should you have questions after your visit or need to cancel or reschedule your appointment, please contact [facility]  Dept: [phone]  and follow the prompts.  Office hours are 8:00 a.m. to 4:30 p.m. Monday - Friday. Please note that voicemails left after 4:00 p.m. may not be returned until the following business day.  We are closed weekends and major holidays. You have access to a nurse at all times for urgent questions. Please call the main number to the clinic Dept: [phone] and follow the prompts.   For any non-urgent questions, you may also contact your provider using MyChart. We now offer e-Visits for anyone 34 and older to request care online for non-urgent symptoms. For details visit [email or web address].   Also download the MyChart app! Go to the app store, search "MyChart", open the app, select [facility], and log in with your MyChart username and password.  Due to Covid, a mask is required upon entering the hospital/clinic. If you do not have a mask, one will be given to you upon arrival. For doctor visits, patients may have 1 support person aged 68 or older with them. For treatment visits, patients cannot have anyone with them due to current Covid guidelines and our immunocompromised population.   Atezolizumab injection What is this medication? ATEZOLIZUMAB (a te zoe LIZ ue mab) is a monoclonal antibody. It is used to treat bladder cancer (urothelial cancer), liver cancer, lung cancer, and melanoma. This medicine may be used for other purposes; ask your health care provider or pharmacist if you have questions. COMMON  BRAND NAME(S): Tecentriq What should I tell my care team before I take this medication? They need to know if you have any of these conditions: autoimmune diseases like Crohn's disease, ulcerative colitis, or lupus have had or planning to have an allogeneic stem cell transplant (uses  someone else's stem cells) history of organ transplant history of radiation to the chest nervous system problems like myasthenia gravis or Guillain-Barre syndrome an unusual or allergic reaction to atezolizumab, other medicines, foods, dyes, or preservatives pregnant or trying to get pregnant breast-feeding How should I use this medication? This medicine is for infusion into a vein. It is given by a health care professional in a hospital or clinic setting. A special MedGuide will be given to you before each treatment. Be sure to read this information carefully each time. Talk to your pediatrician regarding the use of this medicine in children. Special care may be needed. Overdosage: If you think you have taken too much of this medicine contact a poison control center or emergency room at once. NOTE: This medicine is only for you. Do not share this medicine with others. What if I miss a dose? It is important not to miss your dose. Call your doctor or health care professional if you are unable to keep an appointment. What may interact with this medication? Interactions have not been studied. This list may not describe all possible interactions. Give your health care provider a list of all the medicines, herbs, non-prescription drugs, or dietary supplements you use. Also tell them if you smoke, drink alcohol, or use illegal drugs. Some items may interact with your medicine. What should I watch for while using this medication? Your condition will be monitored carefully while you are receiving this medicine. You may need blood work done while you are taking this medicine. Do not become pregnant while taking this medicine or for at least 5 months after stopping it. Women should inform their doctor if they wish to become pregnant or think they might be pregnant. There is a potential for serious side effects to an unborn child. Talk to your health care professional or pharmacist for more information. Do  not breast-feed an infant while taking this medicine or for at least 5 months after the last dose. What side effects may I notice from receiving this medication? Side effects that you should report to your doctor or health care professional as soon as possible: allergic reactions like skin rash, itching or hives, swelling of the face, lips, or tongue black, tarry stools bloody or watery diarrhea breathing problems changes in vision chest pain or chest tightness chills facial flushing fever headache signs and symptoms of high blood sugar such as dizziness; dry mouth; dry skin; fruity breath; nausea; stomach pain; increased hunger or thirst; increased urination signs and symptoms of liver injury like dark yellow or brown urine; general ill feeling or flu-like symptoms; light-colored stools; loss of appetite; nausea; right upper belly pain; unusually weak or tired; yellowing of the eyes or skin stomach pain trouble passing urine or change in the amount of urine Side effects that usually do not require medical attention (report to your doctor or health care professional if they continue or are bothersome): bone pain cough diarrhea joint pain muscle pain muscle weakness swelling of arms or legs tiredness weight loss This list may not describe all possible side effects. Call your doctor for medical advice about side effects. You may report side effects to FDA at 1-800-FDA-1088. Where should I keep my  medication? This drug is given in a hospital or clinic and will not be stored at home. NOTE: This sheet is a summary. It may not cover all possible information. If you have questions about this medicine, talk to your doctor, pharmacist, or health care provider.  2022 [employer]vier/Gold Standard (2020-10-03 00:00:00)

## 2021-03-15 ENCOUNTER — Telehealth: Payer: Self-pay

## 2021-03-15 ENCOUNTER — Telehealth: Payer: Self-pay | Admitting: Pulmonary Disease

## 2021-03-15 ENCOUNTER — Telehealth: Payer: Self-pay | Admitting: Hematology and Oncology

## 2021-03-15 NOTE — Telephone Encounter (Signed)
Scheduled per 2/15 los, pt has been called and confirmed appts

## 2021-03-15 NOTE — Telephone Encounter (Signed)
[name] called: The script for the [place] walker will need to be faxed  with  recent office notes & demographics. Please fax to [facility]. Per patient please include oxygen tank holder and bike handle brakes.   Script and notes are in your box.    Thank you.

## 2021-03-16 NOTE — Telephone Encounter (Signed)
Information faxed to give fax number. Task completed.

## 2021-03-16 NOTE — Telephone Encounter (Signed)
Order for walker needs to go to [facility]  Fax # [phone].

## 2021-03-16 NOTE — Telephone Encounter (Signed)
Per last telephone noted Magda Paganini stated we got the fax and are waiting on Dr Loanne Drilling to sign so we can fix it to the company.  Called and informed patient of this and that as soon as it gets signed we will fax it over.  [facility] is the DME  Nothing further needed

## 2021-03-16 NOTE — Telephone Encounter (Signed)
Signed- will bring it up- thanks- ML

## 2021-03-19 ENCOUNTER — Other Ambulatory Visit: Payer: Self-pay

## 2021-03-19 ENCOUNTER — Telehealth: Payer: Self-pay

## 2021-03-19 DIAGNOSIS — J449 Chronic obstructive pulmonary disease, unspecified: Secondary | ICD-10-CM | POA: Diagnosis not present

## 2021-03-19 DIAGNOSIS — M159 Polyosteoarthritis, unspecified: Secondary | ICD-10-CM | POA: Diagnosis not present

## 2021-03-19 MED ORDER — TRIAMTERENE-HCTZ 37.5-25 MG PO TABS: ORAL_TABLET | ORAL | 2 refills | Status: DC

## 2021-03-19 NOTE — Telephone Encounter (Signed)
..   Encourage patient to contact the pharmacy for refills or they can request refills through [facility]:  01/05/21  NEXT APPOINTMENT DATE:  MEDICATION:  Triamterene hydrachlorothiazide  Is the patient out of medication?   PHARMACY: [facility]  Let patient know to contact pharmacy at the end of the day to make sure medication is ready.  Please notify patient to allow 48-72 hours to process  CLINICAL FILLS OUT ALL BELOW:   LAST REFILL:  QTY:  REFILL DATE:    OTHER COMMENTS:    Okay for refill?  Please advise

## 2021-03-19 NOTE — Telephone Encounter (Signed)
Rx sent in

## 2021-03-22 NOTE — Telephone Encounter (Signed)
See other phone notes. Will close this encounter.

## 2021-03-26 ENCOUNTER — Encounter: Payer: Self-pay | Admitting: Physical Medicine and Rehabilitation

## 2021-03-26 DIAGNOSIS — G894 Chronic pain syndrome: Secondary | ICD-10-CM

## 2021-03-27 ENCOUNTER — Telehealth: Payer: Self-pay | Admitting: Pulmonary Disease

## 2021-03-27 MED ORDER — HYDROCODONE-ACETAMINOPHEN 10-325 MG PO TABS: 1.0000 | ORAL_TABLET | ORAL | 0 refills | Status: DC | PRN

## 2021-03-28 DIAGNOSIS — J449 Chronic obstructive pulmonary disease, unspecified: Secondary | ICD-10-CM | POA: Diagnosis not present

## 2021-03-28 NOTE — Telephone Encounter (Signed)
Called and spoke with Delcie Roch and told her that I printed off the last office visit and was faxing it to her and marked it urgent. Verified fax number and telephone number just in case we needed to contact her again. Nothing further needed at this time.  ?

## 2021-04-02 ENCOUNTER — Ambulatory Visit: Payer: No Typology Code available for payment source | Admitting: Pulmonary Disease

## 2021-04-05 ENCOUNTER — Other Ambulatory Visit: Payer: Self-pay | Admitting: *Deleted

## 2021-04-05 ENCOUNTER — Inpatient Hospital Stay (HOSPITAL_BASED_OUTPATIENT_CLINIC_OR_DEPARTMENT_OTHER): Payer: No Typology Code available for payment source | Admitting: Hematology and Oncology

## 2021-04-05 ENCOUNTER — Other Ambulatory Visit: Payer: Self-pay

## 2021-04-05 ENCOUNTER — Inpatient Hospital Stay: Payer: No Typology Code available for payment source | Admitting: Dietician

## 2021-04-05 ENCOUNTER — Inpatient Hospital Stay: Payer: No Typology Code available for payment source | Attending: Hematology and Oncology

## 2021-04-05 ENCOUNTER — Inpatient Hospital Stay: Payer: No Typology Code available for payment source

## 2021-04-05 VITALS — BP 130/82 | HR 84 | Temp 97.1°F | Resp 17 | Wt 198.5 lb

## 2021-04-05 DIAGNOSIS — R0600 Dyspnea, unspecified: Secondary | ICD-10-CM | POA: Insufficient documentation

## 2021-04-05 DIAGNOSIS — Z832 Family history of diseases of the blood and blood-forming organs and certain disorders involving the immune mechanism: Secondary | ICD-10-CM | POA: Insufficient documentation

## 2021-04-05 DIAGNOSIS — R110 Nausea: Secondary | ICD-10-CM | POA: Diagnosis not present

## 2021-04-05 DIAGNOSIS — Z820 Family history of epilepsy and other diseases of the nervous system: Secondary | ICD-10-CM | POA: Insufficient documentation

## 2021-04-05 DIAGNOSIS — C3431 Malignant neoplasm of lower lobe, right bronchus or lung: Secondary | ICD-10-CM | POA: Insufficient documentation

## 2021-04-05 DIAGNOSIS — G894 Chronic pain syndrome: Secondary | ICD-10-CM | POA: Diagnosis not present

## 2021-04-05 DIAGNOSIS — M1990 Unspecified osteoarthritis, unspecified site: Secondary | ICD-10-CM | POA: Diagnosis not present

## 2021-04-05 DIAGNOSIS — Z8616 Personal history of COVID-19: Secondary | ICD-10-CM | POA: Insufficient documentation

## 2021-04-05 DIAGNOSIS — Z809 Family history of malignant neoplasm, unspecified: Secondary | ICD-10-CM | POA: Insufficient documentation

## 2021-04-05 DIAGNOSIS — R5383 Other fatigue: Secondary | ICD-10-CM | POA: Diagnosis not present

## 2021-04-05 DIAGNOSIS — Z886 Allergy status to analgesic agent status: Secondary | ICD-10-CM | POA: Insufficient documentation

## 2021-04-05 DIAGNOSIS — Z90721 Acquired absence of ovaries, unilateral: Secondary | ICD-10-CM | POA: Insufficient documentation

## 2021-04-05 DIAGNOSIS — C3490 Malignant neoplasm of unspecified part of unspecified bronchus or lung: Secondary | ICD-10-CM

## 2021-04-05 DIAGNOSIS — Z8719 Personal history of other diseases of the digestive system: Secondary | ICD-10-CM | POA: Diagnosis not present

## 2021-04-05 DIAGNOSIS — Z885 Allergy status to narcotic agent status: Secondary | ICD-10-CM | POA: Diagnosis not present

## 2021-04-05 DIAGNOSIS — Z9049 Acquired absence of other specified parts of digestive tract: Secondary | ICD-10-CM | POA: Insufficient documentation

## 2021-04-05 DIAGNOSIS — Z8249 Family history of ischemic heart disease and other diseases of the circulatory system: Secondary | ICD-10-CM | POA: Diagnosis not present

## 2021-04-05 DIAGNOSIS — Z823 Family history of stroke: Secondary | ICD-10-CM | POA: Insufficient documentation

## 2021-04-05 DIAGNOSIS — F17210 Nicotine dependence, cigarettes, uncomplicated: Secondary | ICD-10-CM | POA: Insufficient documentation

## 2021-04-05 DIAGNOSIS — C771 Secondary and unspecified malignant neoplasm of intrathoracic lymph nodes: Secondary | ICD-10-CM | POA: Insufficient documentation

## 2021-04-05 DIAGNOSIS — Z880 Allergy status to penicillin: Secondary | ICD-10-CM | POA: Diagnosis not present

## 2021-04-05 DIAGNOSIS — Z8261 Family history of arthritis: Secondary | ICD-10-CM | POA: Insufficient documentation

## 2021-04-05 DIAGNOSIS — E785 Hyperlipidemia, unspecified: Secondary | ICD-10-CM | POA: Insufficient documentation

## 2021-04-05 DIAGNOSIS — Z79899 Other long term (current) drug therapy: Secondary | ICD-10-CM | POA: Diagnosis not present

## 2021-04-05 DIAGNOSIS — E119 Type 2 diabetes mellitus without complications: Secondary | ICD-10-CM | POA: Diagnosis not present

## 2021-04-05 DIAGNOSIS — Z5112 Encounter for antineoplastic immunotherapy: Secondary | ICD-10-CM | POA: Insufficient documentation

## 2021-04-05 DIAGNOSIS — Z95828 Presence of other vascular implants and grafts: Secondary | ICD-10-CM

## 2021-04-05 DIAGNOSIS — M549 Dorsalgia, unspecified: Secondary | ICD-10-CM | POA: Diagnosis not present

## 2021-04-05 DIAGNOSIS — K5909 Other constipation: Secondary | ICD-10-CM | POA: Diagnosis not present

## 2021-04-05 DIAGNOSIS — Z882 Allergy status to sulfonamides status: Secondary | ICD-10-CM | POA: Insufficient documentation

## 2021-04-05 DIAGNOSIS — Z5111 Encounter for antineoplastic chemotherapy: Secondary | ICD-10-CM | POA: Diagnosis present

## 2021-04-05 DIAGNOSIS — Z814 Family history of other substance abuse and dependence: Secondary | ICD-10-CM | POA: Insufficient documentation

## 2021-04-05 DIAGNOSIS — Z836 Family history of other diseases of the respiratory system: Secondary | ICD-10-CM | POA: Insufficient documentation

## 2021-04-05 DIAGNOSIS — Z811 Family history of alcohol abuse and dependence: Secondary | ICD-10-CM | POA: Insufficient documentation

## 2021-04-05 LAB — CBC WITH DIFFERENTIAL (CANCER CENTER ONLY)
Abs Immature Granulocytes: 0.02 10*3/uL (ref 0.00–0.07)
Basophils Absolute: 0 10*3/uL (ref 0.0–0.1)
Basophils Relative: 1 %
Eosinophils Absolute: 0.5 10*3/uL (ref 0.0–0.5)
Eosinophils Relative: 7 %
HCT: 36.9 % (ref 36.0–46.0)
Hemoglobin: 11.5 g/dL — ABNORMAL LOW (ref 12.0–15.0)
Immature Granulocytes: 0 %
Lymphocytes Relative: 37 %
Lymphs Abs: 2.4 10*3/uL (ref 0.7–4.0)
MCH: 28.9 pg (ref 26.0–34.0)
MCHC: 31.2 g/dL (ref 30.0–36.0)
MCV: 92.7 fL (ref 80.0–100.0)
Monocytes Absolute: 0.6 10*3/uL (ref 0.1–1.0)
Monocytes Relative: 9 %
Neutro Abs: 3 10*3/uL (ref 1.7–7.7)
Neutrophils Relative %: 46 %
Platelet Count: 154 10*3/uL (ref 150–400)
RBC: 3.98 MIL/uL (ref 3.87–5.11)
RDW: 15.7 % — ABNORMAL HIGH (ref 11.5–15.5)
WBC Count: 6.6 10*3/uL (ref 4.0–10.5)
nRBC: 0 % (ref 0.0–0.2)

## 2021-04-05 LAB — CMP (CANCER CENTER ONLY)
ALT: 12 U/L (ref 0–44)
AST: 17 U/L (ref 15–41)
Albumin: 3.6 g/dL (ref 3.5–5.0)
Alkaline Phosphatase: 79 U/L (ref 38–126)
Anion gap: 5 (ref 5–15)
BUN: 8 mg/dL (ref 8–23)
CO2: 30 mmol/L (ref 22–32)
Calcium: 8.6 mg/dL — ABNORMAL LOW (ref 8.9–10.3)
Chloride: 105 mmol/L (ref 98–111)
Creatinine: 0.8 mg/dL (ref 0.44–1.00)
GFR, Estimated: >60 mL/min (ref >60)
Glucose, Bld: 107 mg/dL — ABNORMAL HIGH (ref 70–99)
Potassium: 3.7 mmol/L (ref 3.5–5.1)
Sodium: 140 mmol/L (ref 135–145)
Total Bilirubin: 0.3 mg/dL (ref 0.3–1.2)
Total Protein: 6.2 g/dL — ABNORMAL LOW (ref 6.5–8.1)

## 2021-04-05 LAB — TSH: TSH: 1.382 u[IU]/mL (ref 0.308–3.960)

## 2021-04-05 MED ORDER — BENZONATATE 100 MG PO CAPS: 100.0000 mg | ORAL_CAPSULE | Freq: Three times a day (TID) | ORAL | 1 refills | Status: DC

## 2021-04-05 MED ORDER — ONDANSETRON HCL 8 MG PO TABS: 16.0000 mg | ORAL_TABLET | Freq: Two times a day (BID) | ORAL | 0 refills | Status: DC | PRN

## 2021-04-05 MED ORDER — SODIUM CHLORIDE 0.9 % IV SOLN
1200.0000 mg | Freq: Once | INTRAVENOUS | Status: AC
  Administered 2021-04-05: 15:00:00 1200 mg via INTRAVENOUS
  Filled 2021-04-05: qty 20

## 2021-04-05 MED ORDER — HEPARIN SOD (PORK) LOCK FLUSH 100 UNIT/ML IV SOLN: 500.0000 [IU] | Freq: Once | INTRAVENOUS | Status: DC | PRN

## 2021-04-05 MED ORDER — SODIUM CHLORIDE 0.9% FLUSH
10.0000 mL | Freq: Once | INTRAVENOUS | Status: AC
  Administered 2021-04-05: 14:00:00 10 mL

## 2021-04-05 MED ORDER — SODIUM CHLORIDE 0.9 % IV SOLN: Freq: Once | INTRAVENOUS | Status: AC

## 2021-04-05 MED ORDER — SODIUM CHLORIDE 0.9% FLUSH: 10.0000 mL | INTRAVENOUS | Status: DC | PRN

## 2021-04-05 NOTE — Progress Notes (Signed)
Nutrition Follow-up: ? ?Patient with extensive stage small cell lung cancer. She is currently receiving maintenance Atezolizumab.  ? ?Met with patient during infusion. She reports decreased appetite and eating smaller portions than normal. Her dog gets to eat what is left on the plate, lately he has been eating more than she is. Yesterday patient ate 1/2 cup of dry fruit loops for breakfast, usually eats a ham sandwich for lunch, yesterday she ate 2 pancakes and 2 hotdogs with mustard, chili, slaw for dinner.  Patient reports drinking 2-3 cups of coffee in the morning and ~12 bottles of water daily. Patient has intermittent nausea relieved with zofran.  ? ?Medications: reviewed  ? ?Labs: glucose 107 ? ?Anthropometrics: Weight 198.8 oz today decreased  ? ?2/15 - 200 lb 6.4 oz ?1/26 - 199 lb 4.7 oz ?12/27 - 199 lb 8 oz  ?11/21 - 200 lb  ? ? ?NUTRITION DIAGNOSIS: Inadequate oral intake continues ? ? ?INTERVENTION:  ?Educated on importance of adequate calories and protein to maintain strength/weight ?Discussed foods with protein, educated on including protein source with meals and snacks - handout provided  ?Provided samples of Ensure supplements for pt to try - coupons provided ?Discussed strategies for nausea - handout with tips provided ?Continue taking nausea medication as needed  ?  ? ?MONITORING, EVALUATION, GOAL: weight trends, intake ? ? ?NEXT VISIT: To be scheduled ? ? ? ?

## 2021-04-05 NOTE — Progress Notes (Signed)
[place] [phone]   [phone]  PROGRESS NOTE  Patient Care Team: Allwardt, Randa Evens, PA-C as PCP - General (Physician Assistant) Milus Banister, MD as Attending Physician (Gastroenterology) Dr. Darylene Price, MD as Consulting Physician (Orthopedic Surgery) Latanya Maudlin, MD as Consulting Physician (Orthopedic Surgery) Kathrynn Ducking, MD (Inactive) as Consulting Physician (Neurology) Okey Regal, [place] as Consulting Physician (Optometry) [name], [facility] as Pharmacist (Pharmacist) Cameron Sprang, MD as Consulting Physician (Neurology) Valrie Hart, RN as Oncology Nurse Navigator (Oncology) Leona Singleton, RN as [facility]  Hematological/Oncological History # Small Cell Lung Cancer, Extensive Stage 07/05/2020: CT abdomen for lower abdominal pain. New pulmonary nodular density noted 07/06/2020: CT chest showed 2.2 cm macrolobulated right lower lobe pulmonary nodule (favored) versus pathologically enlarged infrahilar lymph node 10/13/2020: PET CT scan performed, findings show 2 cm right lower lobe lung mass is hypermetabolic and consistent with primary lung neoplasm. Additionally found hypermetabolic 17 mm soft tissue lesion between the descending duodenum and the pancreatic head  10/19/2020: EGD to biopsy hypermetabolic lymph node. Biopsy results show small cell lung cancer 10/26/2020: establish care with Dr. Lorenso Courier  11/13/2020: Cycle 1 Day 1 of Carbo/Etop/Atezolizumab 12/04/2020: Cycle 2 Day 1 of Carbo/Etop/Atezolizumab 11/22-11/25/2022: admitted for E. Coli bacteremia/sepsis. Start of Cycle 3 delayed. 01/02/2021: Cycle 3 Day 1 of Carbo/Etop/Atezolizumab 01/23/2021: Cycle 4 Day 1 of Carbo/Etop/Atezolizumab 02/22/2021: Cycle 5 Day 1 of Atezolizumab Maintenance. Delayed start due to patient's COVID infection.  03/14/2021: Cycle 6 Day 1 of Atezolizumab Maintenance 04/05/2021: Cycle 7 Day 1 of  Atezolizumab Maintenance  Interval History:  [name] 66 y.o. female with medical history significant for extensive stage small cell lung cancer who presents for a follow up visit. The patient's last visit was on 03/14/2021. In the interim since the last visit she has completed cycle 6 of chemotherapy. She presents today to start cycle 7 of chemotherapy.  On exam today [name] reports her primary symptom has been fatigue.  She notes that she stays tired all the time.  She is also struggling with nausea though the Zofran feels are effective.  She notes that she takes Zofran pills for every 5 days.  She is requesting a refill on this medication today.  Despite having nausea she does not have any vomiting and continues to have a good appetite.  She does struggle with a cough and congestion.  She notes that she has sputum which is "dirty gray or clear".  Otherwise her weight has been stable she has had no other major changes in her health.  She denies fevers, chills, night sweats, chest pain, neuropathy or peripheral edema.  She has no other complaints. A full 10 point ROS is listed below.   MEDICAL HISTORY:  Past Medical History:  Diagnosis Date   Allergic rhinitis    Anemia    Anxiety    Chicken pox    Chronic back pain    COPD (chronic obstructive pulmonary disease) (HCC)    Depression    DM (diabetes mellitus) ([place])    Essential hypertension    GERD (gastroesophageal reflux disease)    Headache    migraines   History of gastritis    EGD 2015   History of home oxygen therapy    2 liters at hs last 6 months   Hyperlipidemia    Hypothyroidism    Migraines    Osteoarthritis    oa   Scoliosis  SURGICAL HISTORY: Past Surgical History:  Procedure Laterality Date   APPENDECTOMY     1985   BIOPSY  07/24/2017   Procedure: BIOPSY;  Surgeon: Milus Banister, MD;  Location: [facility];  Service: Endoscopy;;   CARDIAC CATHETERIZATION N/A 10/31/2015   Procedure: Left Heart  Cath and Coronary Angiography;  Surgeon: Leonie Man, MD;  Location: [facility] [place] [facility];  Service: Cardiovascular;  Laterality: N/A;   CARPAL TUNNEL RELEASE Left    CARPAL TUNNEL RELEASE Right    CHOLECYSTECTOMY  late 1980's   COLONOSCOPY WITH PROPOFOL N/A 07/24/2017   Procedure: COLONOSCOPY WITH PROPOFOL;  Surgeon: Milus Banister, MD;  Location: [facility];  Service: Endoscopy;  Laterality: N/A;   ESOPHAGOGASTRODUODENOSCOPY N/A 07/24/2017   Procedure: ESOPHAGOGASTRODUODENOSCOPY (EGD);  Surgeon: Milus Banister, MD;  Location: [facility];  Service: Endoscopy;  Laterality: N/A;   ESOPHAGOGASTRODUODENOSCOPY (EGD) WITH PROPOFOL N/A 10/19/2020   Procedure: ESOPHAGOGASTRODUODENOSCOPY (EGD) WITH PROPOFOL;  Surgeon: Milus Banister, MD;  [facility]: [facility];  Service: Endoscopy;  Laterality: N/A;   FINE NEEDLE ASPIRATION N/A 10/19/2020   Procedure: FINE NEEDLE ASPIRATION (FNA) LINEAR;  Surgeon: Milus Banister, MD;  Location: [facility];  Service: Endoscopy;  Laterality: N/A;   GALLBLADDER SURGERY  1991   HIP CLOSED REDUCTION Right 01/08/2016   Procedure: CLOSED MANIPULATION HIP;  Surgeon: Susa Day, MD;  Location: [facility];  Service: Orthopedics;  Laterality: Right;   HIP CLOSED REDUCTION Right 01/19/2016   Procedure: ATTEMPTED CLOSED REDUCTION RIGHT HIP;  Surgeon: Wylene Simmer, MD;  Location: [facility];  Service: Orthopedics;  Laterality: Right;   HIP CLOSED REDUCTION Right 01/20/2016   Procedure: CLOSED REDUCTION RIGHT TOTAL HIP;  Surgeon: Paralee Cancel, MD;  Location: [facility];  Service: Orthopedics;  Laterality: Right;   HIP CLOSED REDUCTION Right 02/17/2016   Procedure: CLOSED REDUCTION RIGHT TOTAL HIP;  Surgeon: Rod Can, MD;  Location: [facility];  Service: Orthopedics;  Laterality: Right;   HIP CLOSED REDUCTION Right 02/28/2016   Procedure: CLOSED REDUCTION HIP;  Surgeon: Nicholes Stairs, MD;  Location: [facility];  Service: Orthopedics;  Laterality: Right;   IR IMAGING GUIDED PORT  INSERTION  11/01/2020   POLYPECTOMY  07/24/2017   Procedure: POLYPECTOMY;  Surgeon: Milus Banister, MD;  Location: [facility];  Service: Endoscopy;;   [place], with 1 ovary removed and 2 nd ovary removed 2003   TOTAL HIP ARTHROPLASTY Right    Original surgery 2006 with revision 2010   TOTAL HIP REVISION Right 01/01/2016   Procedure: TOTAL HIP REVISION;  Surgeon: Paralee Cancel, MD;  Location: [facility];  Service: Orthopedics;  Laterality: Right;   TOTAL HIP REVISION Right 03/02/2016   Procedure: TOTAL HIP REVISION;  Surgeon: Paralee Cancel, MD;  Location: [facility];  Service: Orthopedics;  Laterality: Right;   TOTAL HIP REVISION Right 09/02/2016   Procedure: Right hip constrained liner- posterior;  Surgeon: Paralee Cancel, MD;  Location: [facility];  Service: Orthopedics;  Laterality: Right;   ULNAR NERVE TRANSPOSITION Right    UPPER ESOPHAGEAL ENDOSCOPIC ULTRASOUND (EUS) N/A 10/19/2020   Procedure: UPPER ESOPHAGEAL ENDOSCOPIC ULTRASOUND (EUS);  Surgeon: Milus Banister, MD;  Location: [facility];  Service: Endoscopy;  Laterality: N/A;  periduodenal lesion    SOCIAL HISTORY: Social History   Socioeconomic History   Marital status: Married    Spouse name: Not on file   Number of children: 2   Years of education: Not  on file   Highest education level: Not on file  Occupational History   Occupation: disabled   Occupation: disabled  Tobacco Use   Smoking status: Every Day    Packs/day: 2.00    Years: 46.00    Pack years: 92.00    Types: Cigarettes   Smokeless tobacco: Never   Tobacco comments:    Currently smoking 1.5ppd as of 11/10/20  Vaping Use   Vaping Use: Never used  Substance and Sexual Activity   Alcohol use: No   Drug use: No   Sexual activity: Not Currently    Partners: Male  Other Topics Concern   Not on file  Social History Narrative   Right handed    Caffeine~ 2 cups per day    Lives at home with husband (strained  relationship)   Primary caretaker for disabled brother who had aneurism   Daughter died 07-02-2018    Social Determinants of Radio broadcast assistant Strain: Not on file  Food Insecurity: Not on file  Transportation Needs: Not on file  Physical Activity: Not on file  Stress: Not on file  Social Connections: Not on file  Intimate Partner Violence: Not on file    FAMILY HISTORY: Family History  Problem Relation Age of Onset   COPD Mother    Heart disease Mother    Lung disease Father        Asbestosis   Heart attack Father    Heart disease Father    Cerebral aneurysm Brother    Aneurysm Brother        Brain   Drug abuse Daughter    Epilepsy Son    Alcohol abuse Son    Drug abuse Son    Arthritis Maternal Grandmother    Heart disease Maternal Grandmother    Asthma Maternal Grandfather    Cancer Maternal Grandfather    Arthritis Paternal Grandmother    Heart disease Paternal Grandmother    Stroke Paternal Grandmother    Early death Paternal Grandfather    Heart disease Paternal Grandfather     ALLERGIES:  is allergic to metformin and related, nsaids, wellbutrin [bupropion], aleve [naproxen sodium], codeine, penicillins, and sulfonamide derivatives.  MEDICATIONS:  Current Outpatient Medications  Medication Sig Dispense Refill   albuterol (PROAIR HFA) 108 (90 Base) MCG/ACT inhaler 2 puffs every 4 hours as needed only  if your can't catch your breath 18 g 3   ALPRAZolam (XANAX) 1 MG tablet Take 1 tablet po TID as needed for anxiety. 90 tablet 2   aspirin EC 81 MG tablet Take 1 tablet (81 mg total) by mouth daily. Swallow whole. 90 tablet 3   atorvastatin (LIPITOR) 20 MG tablet TAKE 1 TABLET BY MOUTH AT BEDTIME 90 tablet 1   benzonatate (TESSALON) 100 MG capsule TAKE 1 CAPSULE BY MOUTH THREE TIMES DAILY 30 capsule 1   buPROPion (WELLBUTRIN SR) 150 MG 12 hr tablet Take 1 tablet (150 mg total) by mouth 2 (two) times daily. For mood- take with Cymbalta 60 tablet 5    cyclobenzaprine (FLEXERIL) 10 MG tablet Take 1 tablet (10 mg total) by mouth 3 (three) times daily as needed for muscle spasms. 90 tablet 5   DULoxetine (CYMBALTA) 60 MG capsule TAKE TWO CAPSULES BY MOUTH DAILY 60 capsule 5   fluticasone (FLONASE) 50 MCG/ACT nasal spray Place 2 sprays into both nostrils daily. 16 g 6   Fluticasone-Umeclidin-Vilant (TRELEGY ELLIPTA) 200-62.5-25 MCG/ACT AEPB Inhale 1 puff into the lungs daily. 60 each  11   furosemide (LASIX) 20 MG tablet TAKE 1 TABLET BY MOUTH EVERY DAY AS NEEDED 30 tablet 90   HYDROcodone-acetaminophen (NORCO) 10-325 MG tablet Take 1 tablet by mouth every 4 (four) hours as needed (max 5 tabs/day). 150 tablet 0   levothyroxine (SYNTHROID) 125 MCG tablet Take 1 tablet (125 mcg total) by mouth daily before breakfast. 30 tablet 5   loratadine (CLARITIN) 10 MG tablet Take 10 mg by mouth daily.     omeprazole (PRILOSEC) 40 MG capsule TAKE ONE CAPSULE BY MOUTH TWICE DAILY 60 capsule 2   ondansetron (ZOFRAN) 8 MG tablet Take 2 tablets (16 mg total) by mouth 2 (two) times daily as needed. 60 tablet 0   OXYGEN Inhale 3 L into the lungs at bedtime. Uses At Night     potassium chloride (KLOR-CON M) 10 MEQ tablet TAKE 1 TABLET BY MOUTH AT BEDTIME 90 tablet 1   prochlorperazine (COMPAZINE) 10 MG tablet Take 1 tablet (10 mg total) by mouth every 6 (six) hours as needed for nausea or vomiting. 30 tablet 0   triamterene-hydrochlorothiazide (MAXZIDE-25) 37.5-25 MG tablet TAKE 1 TABLET BY MOUTH DAILY - EMERGENCY REFILL FAXED DR. 90 tablet 2   No current facility-administered medications for this visit.    REVIEW OF SYSTEMS:   Constitutional: ( - ) fevers, ( - )  chills , ( - ) night sweats Eyes: ( - ) blurriness of vision, ( - ) double vision, ( - ) watery eyes Ears, nose, mouth, throat, and face: ( - ) mucositis, ( - ) sore throat Respiratory: ( - ) cough, ( +) dyspnea, ( - ) wheezes Cardiovascular: ( - ) palpitation, ( - ) chest discomfort, ( - ) lower  extremity swelling Gastrointestinal:  ( +) nausea, ( - ) heartburn, ( - ) change in bowel habits Skin: ( - ) abnormal skin rashes Lymphatics: ( - ) new lymphadenopathy, ( - ) easy bruising Neurological: ( - ) numbness, ( - ) tingling, ( - ) new weaknesses Behavioral/Psych: ( - ) mood change, ( - ) new changes  All other systems were reviewed with the patient and are negative.  PHYSICAL EXAMINATION: ECOG PERFORMANCE STATUS: 1 - Symptomatic but completely ambulatory  Vitals:   04/05/21 1356  BP: 130/82  Pulse: 84  Resp: 17  Temp: (!) 97.1 F (36.2 C)  SpO2: 97%   Filed Weights   04/05/21 1356  Weight: 198 lb 8 oz (90 kg)    GENERAL: Well-appearing middle-age Caucasian female, alert, no distress and comfortable SKIN: skin color, texture, turgor are normal, no rashes or significant lesions EYES: conjunctiva are pink and non-injected, sclera clear LUNGS:  normal breathing effort. Diffuse wheezing hear on ausculation.  HEART: regular rate & rhythm and no murmurs and no lower extremity edema Musculoskeletal: no cyanosis of digits and no clubbing  PSYCH: alert & oriented x 3, fluent speech NEURO: no focal motor/sensory deficits  LABORATORY DATA:  I have reviewed the data as listed CBC Latest Ref Rng & Units 04/05/2021 03/14/2021 02/22/2021  WBC 4.0 - 10.5 K/uL 6.6 9.1 9.7  Hemoglobin 12.0 - 15.0 g/dL 11.5(L) 10.6(L) 8.5(L)  Hematocrit 36.0 - 46.0 % 36.9 35.2(L) 28.3(L)  Platelets 150 - 400 K/uL 154 146(L) 313    CMP Latest Ref Rng & Units 04/05/2021 03/14/2021 02/22/2021  Glucose 70 - 99 mg/dL 107(H) 95 94  BUN 8 - 23 mg/dL 8 8 10   Creatinine 0.44 - 1.00 mg/dL 0.80 0.84 0.94  Sodium 135 -  145 mmol/L 140 136 136  Potassium 3.5 - 5.1 mmol/L 3.7 3.8 4.2  Chloride 98 - 111 mmol/L 105 103 103  CO2 22 - 32 mmol/L 30 28 25   Calcium 8.9 - 10.3 mg/dL 8.6(L) 8.6(L) 8.8(L)  Total Protein 6.5 - 8.1 g/dL 6.2(L) 6.2(L) 6.9  Total Bilirubin 0.3 - 1.2 mg/dL 0.3 0.4 0.5  Alkaline Phos 38 - 126  U/L 79 78 102  AST 15 - 41 U/L 17 20 16   ALT 0 - 44 U/L 12 8 10     No results found for: MPROTEIN Lab Results  Component Value Date   KPAFRELGTCHN 0.75 06/30/2014   LAMBDASER 3.78 (H) 06/30/2014   KAPLAMBRATIO 0.20 (L) 06/30/2014     RADIOGRAPHIC STUDIES: No results found.  ASSESSMENT & PLAN [name] 66 y.o. female with medical history significant for extensive stage small cell lung cancer who presents for a follow up visit.   After review of the labs, review of the records, and discussion with the patient the patients findings are most consistent with extensive stage small cell lung cancer with metastasis from the right lower lobe to the lymph nodes of the abdomen.  At this time we will pursue triple therapy with carboplatin, etoposide, and atezolizumab.  After 4 cycles we will convert to maintenance atezolizumab alone.  We previously discussed the risks and benefits of this therapy and the patient was in agreement to proceed with this treatment.  The treatment of choice consist of carboplatin, etoposide, and atezolizumab.  The regimen consists of carboplatin AUC of 5 IV on day 1, etoposide 100 mg per metered squared IV on day 1, 2, and 3 and atezolizumab 1200 mg on day 1.  This continues for 21-day cycles.  After 4 cycles the patient proceeds with atezolizumab maintenance therapy alone.   #Small Cell Lung Cancer, Extensive Stage -- MRI of the brain shows no evidence of intracranial spread --Findings are currently consistent with metastatic small cell lung cancer with metastatic spread to the lymph nodes of the abdomen --Prognosis is poor with anticipated life span of less than 12 months -- Patient has extensive stage due to metastatic spread to lymph nodes in the abdomen --Plan to proceed with carboplatin, etoposide, and atezolizumab --will consider prophylactic cranial radiation after start of chemotherapy.  Plan: --today is Cycle 7 Day 1 of Atezolizumab maintenance --labs  today were reviewed and adequate for treatment. Anemia has improved with Hgb 11.5.  Mild thrombocytopenia with Platelet 154 K. No intervention needed.  --CT scan q 3 months. This will be due in March 2023. Last scan in Dec 2022 showed an excellent response to therapy.  --RTC in 3 weeks for next cycle of maintenance atezolizumab    #Supportive Care -- chemotherapy education complete -- port placed -- zofran 8mg  q8H PRN and compazine 10mg  PO q6H for nausea -- EMLA cream for port -- no pain medication required at this time.   #Post prandial nausea: --Recommended to try zofran before her meals to help with nausea.  Orders Placed This Encounter  Procedures   CT CHEST ABDOMEN PELVIS W CONTRAST    Standing Status:   Future    Standing Expiration Date:   04/06/2022    Order Specific Question:   Preferred imaging location?    Answer:   [facility]    Order Specific Question:   Is Oral Contrast requested for this exam?    Answer:   Yes, Per Radiology protocol   All questions were answered.  The patient knows to call the clinic with any problems, questions or concerns.  I have spent a total of 30 minutes minutes of face-to-face and non-face-to-face time, preparing to see the patient,  performing a medically appropriate examination, counseling and educating the patient, ordering medications/test, documenting clinical information in the electronic health record, and care coordination.   Ledell Peoples, MD Department of Hematology/Oncology [facility] Phone: [phone] Pager: [phone] Email: [email or web address]  04/05/2021 2:28 PM

## 2021-04-06 ENCOUNTER — Ambulatory Visit: Payer: No Typology Code available for payment source | Admitting: Physician Assistant

## 2021-04-10 ENCOUNTER — Encounter: Payer: Self-pay | Admitting: Pulmonary Disease

## 2021-04-10 ENCOUNTER — Other Ambulatory Visit: Payer: Self-pay

## 2021-04-10 ENCOUNTER — Ambulatory Visit (INDEPENDENT_AMBULATORY_CARE_PROVIDER_SITE_OTHER): Payer: No Typology Code available for payment source | Admitting: Pulmonary Disease

## 2021-04-10 VITALS — BP 120/70 | HR 86 | Temp 98.1°F | Ht 64.0 in | Wt 197.4 lb

## 2021-04-10 DIAGNOSIS — G4734 Idiopathic sleep related nonobstructive alveolar hypoventilation: Secondary | ICD-10-CM | POA: Diagnosis not present

## 2021-04-10 DIAGNOSIS — J449 Chronic obstructive pulmonary disease, unspecified: Secondary | ICD-10-CM

## 2021-04-10 DIAGNOSIS — F17210 Nicotine dependence, cigarettes, uncomplicated: Secondary | ICD-10-CM

## 2021-04-10 MED ORDER — TRELEGY ELLIPTA 200-62.5-25 MCG/ACT IN AEPB: 1.0000 | INHALATION_SPRAY | Freq: Every day | RESPIRATORY_TRACT | 0 refills | Status: DC

## 2021-04-10 MED ORDER — ALBUTEROL SULFATE HFA 108 (90 BASE) MCG/ACT IN AERS: INHALATION_SPRAY | RESPIRATORY_TRACT | 3 refills | Status: DC

## 2021-04-10 MED ORDER — FLUTICASONE-SALMETEROL 250-50 MCG/ACT IN AEPB: 1.0000 | INHALATION_SPRAY | Freq: Two times a day (BID) | RESPIRATORY_TRACT | 5 refills | Status: DC

## 2021-04-10 NOTE — Patient Instructions (Addendum)
?  COPD  ?--Will provide Trelegy samples. ?--START Advair 250-50 mcg ONE puff TWICE a day ?--CONTINUE Albuterol AS NEEDED shortness of breath or wheezing. REFILL ? ?Nocturnal Hypoxemia on 3L O2 ?--CONTINUE supplemental oxygen at 3L nightly ? ?Follow-up with me in 3 months ?

## 2021-04-10 NOTE — Progress Notes (Signed)
? ?@Patient  ID: [name], female    DOB: 07-21-1955, 66 y.o.   MRN: [id number] ? ?Chief Complaint  ?Patient presents with  ? Follow-up  ?  SOB/ cough worse since LOV  ? ? ?[name] is a 66 year old female active smoker with COPD and chronic respiratory failure, scoliosis and DDD who presents for follow-up. She presents with her husband ? ?Since our last visit she was started on Trelegy. She reports shortness of breath is occurring more frequently and seems to occur in morning and late evenings. Wheezing and coughing with yellow-gray sputum are stable. Reports upper chest congestion. She has been checking her oxygen 84-88% in the morning. She reports confusion in the mornings. ? ?At baseline, she has shortness of breath with talking and short distances. She is able to climb stairs one step at a time and goes slow pace. Her activity is limited due to back and bilateral hip pain s/p multiple hip surgeries. Endorses wheezing daily. Has chronic cough with some yellow-gray sputum production. She is not compliant with her home oxygen for > 1 year due to difficulty tolerate the tubing. She has tried Spiriva which she tolerated however insurance did not pay for it. Nebulizers were tolerable however the setup and time spent on the machine was cumbersome. ? ?11/10/20 ?Since our last visit, she was diagnosed with small cell carcinoma of lung confirmed via EUS. She is scheduled for treatment starting 11/13/20.  ? ?She reports she is coughing with chest congestion. She has decreased her cigarettes from 2.5 ppd cigarettes to 1.5 ppd. Denies wheezing. She is compliant with her Trelegy. Symptoms worsen at night. She uses her rescue inhaler 2-3 times a week. ? ?04/10/21 ?Since our last visit she has completed chemotherapy and now on IT. Has some nausea. She is compliant with her Trelegy but has run out due to cost (>$100). She has shortness of breath and cough off the inhaler. She is compliant with nocturnal oxygen and  currently happy with her current insurance company. She continues to smoke 2 ppd due to home stressors. ? ?Social History: ?Active smoker ?Daughter passed from OD two years ago ?Currently has grandson living with her, stressor ? ?Past Medical History:  ?Diagnosis Date  ? Allergic rhinitis   ? Anemia   ? Anxiety   ? Chicken pox   ? Chronic back pain   ? COPD (chronic obstructive pulmonary disease) ([place])   ? Depression   ? DM (diabetes mellitus) ([place])   ? Essential hypertension   ? GERD (gastroesophageal reflux disease)   ? Headache   ? migraines  ? History of gastritis   ? EGD 2015  ? History of home oxygen therapy   ? 2 liters at hs last 6 months  ? Hyperlipidemia   ? Hypothyroidism   ? Migraines   ? Osteoarthritis   ? oa  ? Scoliosis   ? ? ?Outpatient Medications Prior to Visit  ?Medication Sig Dispense Refill  ? albuterol (PROAIR HFA) 108 (90 Base) MCG/ACT inhaler 2 puffs every 4 hours as needed only  if your can't catch your breath 18 g 3  ? ALPRAZolam (XANAX) 1 MG tablet Take 1 tablet po TID as needed for anxiety. 90 tablet 2  ? aspirin EC 81 MG tablet Take 1 tablet (81 mg total) by mouth daily. Swallow whole. 90 tablet 3  ? atorvastatin (LIPITOR) 20 MG tablet TAKE 1 TABLET BY MOUTH AT BEDTIME 90 tablet 1  ? benzonatate (TESSALON) 100  MG capsule Take 1 capsule (100 mg total) by mouth 3 (three) times daily. 30 capsule 1  ? cyclobenzaprine (FLEXERIL) 10 MG tablet Take 1 tablet (10 mg total) by mouth 3 (three) times daily as needed for muscle spasms. 90 tablet 5  ? DULoxetine (CYMBALTA) 60 MG capsule TAKE TWO CAPSULES BY MOUTH DAILY 60 capsule 5  ? fluticasone (FLONASE) 50 MCG/ACT nasal spray Place 2 sprays into both nostrils daily. 16 g 6  ? furosemide (LASIX) 20 MG tablet TAKE 1 TABLET BY MOUTH EVERY DAY AS NEEDED 30 tablet 90  ? HYDROcodone-acetaminophen (NORCO) 10-325 MG tablet Take 1 tablet by mouth every 4 (four) hours as needed (max 5 tabs/day). 150 tablet 0  ? loratadine (CLARITIN) 10 MG tablet Take 10 mg  by mouth daily.    ? omeprazole (PRILOSEC) 40 MG capsule TAKE ONE CAPSULE BY MOUTH TWICE DAILY 60 capsule 2  ? ondansetron (ZOFRAN) 8 MG tablet Take 2 tablets (16 mg total) by mouth 2 (two) times daily as needed. 60 tablet 0  ? OXYGEN Inhale 3 L into the lungs at bedtime. Uses At Night    ? potassium chloride (KLOR-CON M) 10 MEQ tablet TAKE 1 TABLET BY MOUTH AT BEDTIME 90 tablet 1  ? prochlorperazine (COMPAZINE) 10 MG tablet Take 1 tablet (10 mg total) by mouth every 6 (six) hours as needed for nausea or vomiting. 30 tablet 0  ? triamterene-hydrochlorothiazide (MAXZIDE-25) 37.5-25 MG tablet TAKE 1 TABLET BY MOUTH DAILY - EMERGENCY REFILL FAXED DR. 90 tablet 2  ? buPROPion (WELLBUTRIN SR) 150 MG 12 hr tablet Take 1 tablet (150 mg total) by mouth 2 (two) times daily. For mood- take with Cymbalta (Patient not taking: Reported on 04/10/2021) 60 tablet 5  ? levothyroxine (SYNTHROID) 125 MCG tablet Take 1 tablet (125 mcg total) by mouth daily before breakfast. 30 tablet 5  ? Fluticasone-Umeclidin-Vilant (TRELEGY ELLIPTA) 200-62.5-25 MCG/ACT AEPB Inhale 1 puff into the lungs daily. (Patient not taking: Reported on 04/10/2021) 60 each 11  ? ?No facility-administered medications prior to visit.  ? ?Review of Systems  ?Constitutional:  Negative for chills, diaphoresis, fever, malaise/fatigue and weight loss.  ?HENT:  Negative for congestion.   ?Respiratory:  Positive for cough and shortness of breath. Negative for hemoptysis, sputum production and wheezing.   ?Cardiovascular:  Negative for chest pain, palpitations and leg swelling.  ? ? ? ?BP 120/70 (BP Location: Left Arm, Patient Position: Sitting, Cuff Size: Normal)   Pulse 86   Temp 98.1 ?F (36.7 ?C) (Oral)   Ht 5\' 4"  (1.626 m)   Wt 197 lb 6.4 oz (89.5 kg)   SpO2 95%   BMI 33.88 kg/m?  ? ?Physical Exam: ?General: Well-appearing, no acute distress ?HENT: [place], AT ?Eyes: EOMI, no scleral icterus ?Respiratory: Clear to auscultation bilaterally.  No crackles, wheezing or  rales ?Cardiovascular: RRR, -M/R/G, no JVD ?Extremities:-Edema,-tenderness ?Neuro: AAO x4, CNII-XII grossly intact ?Psych: Normal mood, normal affect ? ?Data Reviewed: ? ?PFTs: ?05/26/17 ?FVC 2.38 (72%) FEV1 1.6 (63%) Ratio 63 TLC 105% RV 141% DLCO 55% ?Interpretation: Moderate obstructive defect with significant bronchodilator response in FEV1. With presence and airtrapping and reduced gas exchange, findings are consistent with emphysema. ? ?Labs: ?- Allergy profile 03/18/17 >  Eos 0.3 /  IgE  4 neg RAST  ?Alpha-1 December 05, 2017 normal, MM, 174 ? ?Chest imaging: ?HRCT chest 12/24/2017-no ILD, mild to moderate emphysema ?CT A/P 07/05/20- right infrahilar enlargement vs overlaying right lower lobe lung nodule. Moderate stool in colon. ?CT  Angio Chest Aort 07/26/20 Macrolobulated RLL nodule vs enlarged right infrahilar lymph node with punctate satellite nodule, paraseptal emphysema ?CT Coronary 09/21/20 - Macrolobulated RLLL 2.1 x 1.5 cm lung nodule with adjacent 23mm nodule ?PET 0/28/90 - Hypermetabolic 2cm RLL lesion of the lung and 65mm soft tissue between descending duodenum and pancreatic head, emphysema ?MR Brain 11/04/20 - No metastatic disease ?CT CAP 01/17/21 - Resolution of RLL mass. Mild residual soft tissue of pancreaticoduodenol groove. Close follow-up recommended ? ?Assessment & Plan:  ?66 year old female active smoker with COPD/emphysema, nocturnal hypoxemia and extensive stage small cell lung cancer on maintenance therapy. We reviewed bronchodilator options and due to cost will change to ICS/LABA. If she remains symptomatic, will add additional LAMA inhaler. Discussed clinical course and management of COPD including bronchodilator regimen, smoking cessation, oxygen compliance and action plan for exacerbation. ? ?Small cell carcinoma of the lung, stage IVa ?--Per 03/2021 Oncology note, excellent response to therapy ?--Currently on maintenance therapy ?--Next CT scan in March 2023 ? ?COPD  ?--Will provide  Trelegy samples. ?--START Advair 250-50 mcg ONE puff TWICE a day ?--CONTINUE Albuterol AS NEEDED shortness of breath or wheezing. REFILL ? ?Nocturnal Hypoxemia on 3L O2 ?--CONTINUE supplemental oxygen at 3L nightly ?

## 2021-04-11 ENCOUNTER — Encounter: Payer: Self-pay | Admitting: Pulmonary Disease

## 2021-04-12 DIAGNOSIS — M1712 Unilateral primary osteoarthritis, left knee: Secondary | ICD-10-CM | POA: Diagnosis not present

## 2021-04-13 ENCOUNTER — Ambulatory Visit: Payer: No Typology Code available for payment source | Admitting: Physician Assistant

## 2021-04-13 ENCOUNTER — Encounter: Payer: Self-pay | Admitting: Physician Assistant

## 2021-04-13 ENCOUNTER — Ambulatory Visit (INDEPENDENT_AMBULATORY_CARE_PROVIDER_SITE_OTHER): Payer: No Typology Code available for payment source | Admitting: Physician Assistant

## 2021-04-13 VITALS — BP 110/76 | HR 88 | Temp 98.1°F | Ht 64.0 in | Wt 196.2 lb

## 2021-04-13 DIAGNOSIS — N3281 Overactive bladder: Secondary | ICD-10-CM | POA: Diagnosis not present

## 2021-04-13 DIAGNOSIS — C3490 Malignant neoplasm of unspecified part of unspecified bronchus or lung: Secondary | ICD-10-CM

## 2021-04-13 DIAGNOSIS — R110 Nausea: Secondary | ICD-10-CM | POA: Diagnosis not present

## 2021-04-13 MED ORDER — MIRABEGRON ER 25 MG PO TB24: 25.0000 mg | ORAL_TABLET | Freq: Every day | ORAL | 2 refills | Status: DC

## 2021-04-13 NOTE — Progress Notes (Signed)
? ?Subjective:  ? ? Patient ID: [name], female    DOB: [birth year], 66 y.o.   MRN: [id number] ? ?No chief complaint on file. ? ? ?HPI ?Patient is in today for general f/up. Still seeing Dr. Lorenso Courier for treatment of small cell lung cancer. Just completed Cycle 7 Day 1 of Atezolizumab maintenance.  ? ?Having frequent headaches, nausea. ?Taking Zofran for nausea. Little to no appetite, explaining for 20 lb weight loss in the last year. Marland Kitchen ?Stress currently better in her life at the moment.  ? ?Usual diet -  ?-Cream potatoes and gravy. Green steamy vegetables. Little to no snacks throughout the day.  ?-Faithfully drinks water. 8-10 bottles daily per pt.  ?-2 cups coffee in the morning.  ?Seeing a nutritionist to help with calcium and protein intake.  ? ?Also c/o urinary incontinence - ongoing. States she was supposed to have bladder tacked about 10 years ago, but broke ankle and didn't f/up / reschedule. Wants to look at other options. Tired of wearing adult diapers.  ? ?Past Medical History:  ?Diagnosis Date  ? Allergic rhinitis   ? Anemia   ? Anxiety   ? Chicken pox   ? Chronic back pain   ? COPD (chronic obstructive pulmonary disease) ([place])   ? Depression   ? DM (diabetes mellitus) ([place])   ? Essential hypertension   ? GERD (gastroesophageal reflux disease)   ? Headache   ? migraines  ? History of gastritis   ? EGD 2015  ? History of home oxygen therapy   ? 2 liters at hs last 6 months  ? Hyperlipidemia   ? Hypothyroidism   ? Migraines   ? Osteoarthritis   ? oa  ? Scoliosis   ? ? ?Past Surgical History:  ?Procedure Laterality Date  ? APPENDECTOMY    ? 1985  ? BIOPSY  07/24/2017  ? Procedure: BIOPSY;  Surgeon: Milus Banister, MD;  Location: [facility];  Service: Endoscopy;;  ? CARDIAC CATHETERIZATION N/A 10/31/2015  ? Procedure: Left Heart Cath and Coronary Angiography;  Surgeon: Leonie Man, MD;  Location: [facility];  Service: Cardiovascular;  Laterality: N/A;  ? CARPAL TUNNEL RELEASE Left   ?  CARPAL TUNNEL RELEASE Right   ? CHOLECYSTECTOMY  late 1980's  ? COLONOSCOPY WITH PROPOFOL N/A 07/24/2017  ? Procedure: COLONOSCOPY WITH PROPOFOL;  Surgeon: Milus Banister, MD;  Location: [facility];  Service: Endoscopy;  Laterality: N/A;  ? ESOPHAGOGASTRODUODENOSCOPY N/A 07/24/2017  ? Procedure: ESOPHAGOGASTRODUODENOSCOPY (EGD);  Surgeon: Milus Banister, MD;  Location: [facility];  Service: Endoscopy;  Laterality: N/A;  ? ESOPHAGOGASTRODUODENOSCOPY (EGD) WITH PROPOFOL N/A 10/19/2020  ? Procedure: ESOPHAGOGASTRODUODENOSCOPY (EGD) WITH PROPOFOL;  Surgeon: Milus Banister, MD;  Location: [facility];  Service: Endoscopy;  Laterality: N/A;  ? FINE NEEDLE ASPIRATION N/A 10/19/2020  ? Procedure: FINE NEEDLE ASPIRATION (FNA) LINEAR;  Surgeon: Milus Banister, MD;  Location: [facility];  Service: Endoscopy;  Laterality: N/A;  ? [place]  ? HIP CLOSED REDUCTION Right 01/08/2016  ? Procedure: CLOSED MANIPULATION HIP;  Surgeon: Susa Day, MD;  Location: [facility];  Service: Orthopedics;  Laterality: Right;  ? HIP CLOSED REDUCTION Right 01/19/2016  ? Procedure: ATTEMPTED CLOSED REDUCTION RIGHT HIP;  Surgeon: Wylene Simmer, MD;  Location: [facility];  Service: Orthopedics;  Laterality: Right;  ? HIP CLOSED REDUCTION Right 01/20/2016  ? Procedure: CLOSED REDUCTION RIGHT TOTAL HIP;  Surgeon: Paralee Cancel, MD;  Location: [facility];  Service:  Orthopedics;  Laterality: Right;  ? HIP CLOSED REDUCTION Right 02/17/2016  ? Procedure: CLOSED REDUCTION RIGHT TOTAL HIP;  Surgeon: Rod Can, MD;  Location: [facility];  Service: Orthopedics;  Laterality: Right;  ? HIP CLOSED REDUCTION Right 02/28/2016  ? Procedure: CLOSED REDUCTION HIP;  Surgeon: Nicholes Stairs, MD;  Location: [facility];  Service: Orthopedics;  Laterality: Right;  ? IR IMAGING GUIDED PORT INSERTION  11/01/2020  ? POLYPECTOMY  07/24/2017  ? Procedure: POLYPECTOMY;  Surgeon: Milus Banister, MD;  Location: [facility];  Service: Endoscopy;;  ? TONSILLECTOMY     ? TOTAL ABDOMINAL HYSTERECTOMY    ? 1985, with 1 ovary removed and 2 nd ovary removed 2003  ? TOTAL HIP ARTHROPLASTY Right   ? Original surgery 2006 with revision 2010  ? TOTAL HIP REVISION Right 01/01/2016  ? Procedure: TOTAL HIP REVISION;  Surgeon: Paralee Cancel, MD;  Location: [facility];  Service: Orthopedics;  Laterality: Right;  ? TOTAL HIP REVISION Right 03/02/2016  ? Procedure: TOTAL HIP REVISION;  Surgeon: Paralee Cancel, MD;  Location: [facility];  Service: Orthopedics;  Laterality: Right;  ? TOTAL HIP REVISION Right 09/02/2016  ? Procedure: Right hip constrained liner- posterior;  Surgeon: Paralee Cancel, MD;  Location: [facility];  Service: Orthopedics;  Laterality: Right;  ? ULNAR NERVE TRANSPOSITION Right   ? UPPER ESOPHAGEAL ENDOSCOPIC ULTRASOUND (EUS) N/A 10/19/2020  ? Procedure: UPPER ESOPHAGEAL ENDOSCOPIC ULTRASOUND (EUS);  Surgeon: Milus Banister, MD;  Location: [facility];  Service: Endoscopy;  Laterality: N/A;  periduodenal lesion  ? ? ?Family History  ?Problem Relation Age of Onset  ? COPD Mother   ? Heart disease Mother   ? Lung disease Father   ?     Asbestosis  ? Heart attack Father   ? Heart disease Father   ? Cerebral aneurysm Brother   ? Aneurysm Brother   ?     Brain  ? Drug abuse Daughter   ? Epilepsy Son   ? Alcohol abuse Son   ? Drug abuse Son   ? Arthritis Maternal Grandmother   ? Heart disease Maternal Grandmother   ? Asthma Maternal Grandfather   ? Cancer Maternal Grandfather   ? Arthritis Paternal Grandmother   ? Heart disease Paternal Grandmother   ? Stroke Paternal Grandmother   ? Early death Paternal Grandfather   ? Heart disease Paternal Grandfather   ? ? ?Social History  ? ?Tobacco Use  ? Smoking status: Every Day  ?  Packs/day: 2.00  ?  Years: 46.00  ?  Pack years: 92.00  ?  Types: Cigarettes  ? Smokeless tobacco: Never  ? Tobacco comments:  ?  2 packs of cigarettes smoked daily ARJ 04/10/21  ?Vaping Use  ? Vaping Use: Never used  ?Substance Use Topics  ? Alcohol use: No  ? Drug use: No  ?   ? ?Allergies  ?Allergen Reactions  ? Metformin And Related Diarrhea  ? Nsaids Diarrhea  ? Wellbutrin [Bupropion] Other (See Comments)  ?  Makes her too sleepy ?  ? Aleve [Naproxen Sodium] Other (See Comments)  ?  Headache ?  ? Codeine Nausea Only and Other (See Comments)  ?  GI upset  ? Penicillins Nausea Only and Other (See Comments)  ?  GI upset ?  ? Sulfonamide Derivatives Hives  ? ? ?Review of Systems ?NEGATIVE UNLESS OTHERWISE INDICATED IN HPI ? ? ?   ?Objective:  ?  ? ?BP 110/76   Pulse 88  Temp 98.1 ?F (36.7 ?C)   Ht 5\' 4"  (1.626 m)   Wt 196 lb 3.2 oz (89 kg)   SpO2 95%   BMI 33.68 kg/m?  ? ?Wt Readings from Last 3 Encounters:  ?04/13/21 196 lb 3.2 oz (89 kg)  ?04/10/21 197 lb 6.4 oz (89.5 kg)  ?04/05/21 198 lb 8 oz (90 kg)  ? ? ?BP Readings from Last 3 Encounters:  ?04/13/21 110/76  ?04/10/21 120/70  ?04/05/21 130/82  ?  ? ?Physical Exam ?Vitals and nursing note reviewed.  ?Constitutional:   ?   Appearance: Normal appearance. She is not ill-appearing or toxic-appearing.  ?   Comments: Rollator walker   ?HENT:  ?   Head: Normocephalic and atraumatic.  ?   Right Ear: External ear normal.  ?   Left Ear: External ear normal.  ?   Nose: Nose normal.  ?   Mouth/Throat:  ?   Mouth: Mucous membranes are moist.  ?Eyes:  ?   Extraocular Movements: Extraocular movements intact.  ?   Conjunctiva/sclera: Conjunctivae normal.  ?   Pupils: Pupils are equal, round, and reactive to light.  ?Cardiovascular:  ?   Rate and Rhythm: Normal rate and regular rhythm.  ?   Pulses: Normal pulses.  ?Pulmonary:  ?   Effort: Pulmonary effort is normal.  ?Musculoskeletal:     ?   General: Normal range of motion.  ?   Cervical back: Normal range of motion and neck supple.  ?Skin: ?   General: Skin is warm and dry.  ?Neurological:  ?   General: No focal deficit present.  ?   Mental Status: She is alert and oriented to person, place, and time.  ?Psychiatric:     ?   Mood and Affect: Mood and affect normal. Mood is not depressed.  Affect is not tearful.     ?   Behavior: Behavior normal.     ?   Thought Content: Thought content normal.     ?   Judgment: Judgment normal.  ? ? ?   ?Assessment & Plan:  ? ?Problem List Items Addressed This Visit

## 2021-04-16 ENCOUNTER — Encounter: Payer: Self-pay | Admitting: Hematology and Oncology

## 2021-04-16 ENCOUNTER — Other Ambulatory Visit (HOSPITAL_COMMUNITY): Payer: Self-pay

## 2021-04-18 NOTE — Telephone Encounter (Signed)
ATC patient to see if she got Trelegy samples in the office at her OV and to see if it was 100 or 200 because it is not on her med list. LMTCB also sent mycart message.  ?

## 2021-04-19 ENCOUNTER — Other Ambulatory Visit: Payer: Self-pay

## 2021-04-19 ENCOUNTER — Ambulatory Visit (HOSPITAL_COMMUNITY)
Admission: RE | Admit: 2021-04-19 | Discharge: 2021-04-19 | Disposition: A | Discharge status: Home / Self Care | Payer: No Typology Code available for payment source | Source: Ambulatory Visit | Attending: Hematology and Oncology | Admitting: Hematology and Oncology

## 2021-04-19 ENCOUNTER — Encounter (HOSPITAL_COMMUNITY): Payer: Self-pay

## 2021-04-19 DIAGNOSIS — C3490 Malignant neoplasm of unspecified part of unspecified bronchus or lung: Secondary | ICD-10-CM | POA: Insufficient documentation

## 2021-04-19 MED ORDER — IOHEXOL 300 MG/ML  SOLN
100.0000 mL | Freq: Once | INTRAMUSCULAR | Status: AC | PRN
  Administered 2021-04-19: 100 mL via INTRAVENOUS

## 2021-04-19 MED ORDER — SODIUM CHLORIDE (PF) 0.9 % IJ SOLN
INTRAMUSCULAR | Status: AC
  Filled 2021-04-19: qty 50

## 2021-04-23 ENCOUNTER — Telehealth: Payer: Self-pay | Admitting: *Deleted

## 2021-04-23 NOTE — Telephone Encounter (Signed)
TCT patient regarding recent CT scan results. Spoke with her.  Advised that her CT scans came back great. No visual evidence of lung nodules or masses. Per Dr. Lorenso Courier, her lung cancer is under excellent control. Pt voiced understanding. ?

## 2021-04-23 NOTE — Telephone Encounter (Signed)
-----   Message from Orson Slick, MD sent at 04/22/2021 12:37 PM EDT ----- ?Please let [name] know that her CT scan shows no evidence of new or progressive disease. Her cancer is under excellent control. We will continue treatments q 3 weeks with clinic visits.  ?----- Message ----- ?From: Interface, Rad Results In ?Sent: 04/21/2021   9:27 AM EDT ?To: Orson Slick, MD ? ? ?

## 2021-04-24 ENCOUNTER — Telehealth: Payer: Self-pay | Admitting: Pharmacist

## 2021-04-24 NOTE — Chronic Care Management (AMB) (Signed)
? ? ?Chronic Care Management ?Pharmacy Assistant  ? ?Name: [name]  MRN: [id number] DOB: 07-26-1955 ? ? ?Reason for Encounter: General Adherence Call ?  ? ?Recent office visits:  ?04/13/2021 [facility] (PCP) Allwardt, Randa Evens, PA-C; -Also talked about Myrbetriq.  We can try to get this covered with her insurance and see if this can help some of her symptoms as well, also to improve quality of life. ? ?02/13/2021 VV Lucretia Kern, DO; molnupiravir 200 mg bid x 5 days. ? ?01/05/2021 [facility] (PCP) Allwardt, Alyssa M, PA-C; no medication changes indicated. ? ?Recent consult visits:  ?04/10/2021 [facility] (Pulmonology) Margaretha Seeds, MD;  ?--Will provide Trelegy samples. ?--START Advair 250-50 mcg ONE puff TWICE a day ? ?04/05/2021 [facility] (Oncology) Orson Slick, MD; --today is Cycle 7 Day 1 of Atezolizumab maintenance. ? ?03/14/2021 [facility] (Oncology) Darrel Reach, PA-C; --today is Cycle 6 Day 1 of Atezolizumab maintenance. ? ?03/05/2021 [facility] (Physical Medicine) Courtney Heys, MD;  will increase Norco to 5 tabs/day- has Rx for 4x/day just sent this AM- take 5x/day and call me when current Rx will run out and will refill at 5x/day.  Add Wellbutrin 150 mg BID- take in AM and dinner time-  waking up early in AM due to depression ? ?02/22/2021 [facility] (Oncology) Orson Slick, MD; --today is Cycle 5 Day 1 of Atezolizumab maintenance ? ?01/23/2021 [facility] (Oncology) Orson Slick, MD; --today is Cycle 4 Day 1 of Carbo/Etop/Atezolizumab ? ?01/02/2021 [facility] (Oncology) Orson Slick, MD; --today is Cycle 3 Day 1 of Carbo/Etop/Atezolizumab ? ?12/04/2020 [facility] (Oncology) Orson Slick, MD; --today is Cycle 2 Day 1 of Carbo/Etop/Atezolizumab ? ?11/13/2020 [facility] (Oncology) Orson Slick, MD; --today is Cycle 1 Day 1 of Carbo/Etop/Atezolizumab, --GCSF shot on Day 3 ? ?11/10/2020 [facility] (Pulmonology) Margaretha Seeds, MD; --INCREASE Trelegy 200-62.5-25 mcg ONE puff ONCE a day ?--CONTINUE Albuterol AS NEEDED ? ?Hospital visits:  ?12/19/2020 ED to  Hospital Admission due to sepsis at [place] date: 12/19/2020 ?Discharge date: 12/22/2020 ?# Sepsis present on admission secondary to E. coli bacteremia suspected E. coli UTI: ?-10 more days of Keflex by mouth ?- Suspected atypical pneumonia, however less likely.  Discontinue azithromycin. ?-  Reduced dose of thyroxine to 125 mcg daily from 150 mcg daily.  We will need recheck in 4 weeks. ?-  Will prescribe 2 weeks of magnesium oxide 400 mg daily ? ?Medications: ?Outpatient Encounter Medications as of 04/24/2021  ?Medication Sig Note  ? albuterol (PROAIR HFA) 108 (90 Base) MCG/ACT inhaler 2 puffs every 4 hours as needed only  if your can't catch your breath   ? ALPRAZolam (XANAX) 1 MG tablet Take 1 tablet po TID as needed for anxiety.   ? atorvastatin (LIPITOR) 20 MG tablet TAKE 1 TABLET BY MOUTH AT BEDTIME   ? benzonatate (TESSALON) 100 MG capsule Take 1 capsule (100 mg total) by mouth 3 (three) times daily.   ? cyclobenzaprine (FLEXERIL) 10 MG tablet Take 1 tablet (10 mg total) by mouth 3 (three) times daily as needed for muscle spasms.   ? DULoxetine (CYMBALTA) 60 MG capsule TAKE TWO CAPSULES BY MOUTH DAILY   ? fluticasone (FLONASE) 50 MCG/ACT nasal spray Place 2 sprays into both nostrils daily.   ? fluticasone-salmeterol (ADVAIR DISKUS) 250-50 MCG/ACT AEPB Inhale 1 puff into the lungs in the morning and at bedtime.   ? furosemide (LASIX) 20 MG tablet TAKE 1 TABLET BY MOUTH EVERY DAY  AS NEEDED   ? HYDROcodone-acetaminophen (NORCO) 10-325 MG tablet Take 1 tablet by mouth every 4 (four) hours as needed (max 5 tabs/day).   ? levothyroxine (SYNTHROID) 125 MCG tablet Take 1 tablet (125 mcg total) by mouth daily before breakfast.   ? loratadine (CLARITIN) 10 MG tablet Take 10 mg by mouth daily.   ? mirabegron ER (MYRBETRIQ) 25 MG TB24 tablet Take 1 tablet (25 mg total) by mouth daily.   ? omeprazole (PRILOSEC) 40 MG capsule TAKE ONE CAPSULE BY MOUTH TWICE DAILY   ? ondansetron (ZOFRAN) 8 MG tablet Take  2 tablets (16 mg total) by mouth 2 (two) times daily as needed.   ? OXYGEN Inhale 3 L into the lungs at bedtime. Uses At Night 11/13/2020: Uses 3l/min [place] @ night  ? potassium chloride (KLOR-CON M) 10 MEQ tablet TAKE 1 TABLET BY MOUTH AT BEDTIME   ? prochlorperazine (COMPAZINE) 10 MG tablet Take 1 tablet (10 mg total) by mouth every 6 (six) hours as needed for nausea or vomiting.   ? triamterene-hydrochlorothiazide (MAXZIDE-25) 37.5-25 MG tablet TAKE 1 TABLET BY MOUTH DAILY - EMERGENCY REFILL FAXED DR.   ? ?No facility-administered encounter medications on file as of 04/24/2021.  ? ?Patient Questions: ?Have you had any problems recently with your health? ?Patient states she is currently taking immunotherapy. She states she is doing rather well with said treatment. ? ?Have you had any problems with your pharmacy? ?Patient denies having any problems with her pharmacy. ? ?What issues or side effects are you having with your medications? ?Patient denies having any issues or side effects from any of her medications. ? ?What would you like me to pass along to Leata Mouse, CPP for him to help you with?  ?Patient does not have anything for me to pass along at this time. ? ?Patient scheduled her AWV for 05/03/2021 at 1:30 pm. ? ?Patient also scheduled a follow up with the clinical pharmacist for 05/07/2021 at 3:15 pm. ? ?Care Gaps: ?Medicare Annual Wellness: Due now ?Ophthalmology Exam: Overdue - never done ?Foot Exam: Overdue - never done ?Hemoglobin A1C: 6.5% on 01/05/2021 ?Colonoscopy: Next due on 07/25/2027 ?Dexa Scan: Completed ?Mammogram: Overdue since 05/28/2019 ? ?Future Appointments  ?Date Time Provider [employer]  ?04/25/2021  2:30 PM [facility] [place] None  ?04/25/2021  3:00 PM Lincoln Brigham, PA-C [facility] None  ?04/25/2021  3:30 PM [facility] INFUSION [facility] None  ?05/16/2021  1:45 PM [facility] [place] FLUSH [facility] None  ?05/16/2021  2:20 PM Orson Slick, MD [facility] None  ?05/16/2021  3:00  PM [facility] INFUSION [facility] None  ?05/16/2021  3:15 PM Morrell Riddle, RD [facility] None  ?06/04/2021  2:20 PM Lovorn, Jinny Blossom, MD [facility] CPR  ? ?Star Rating Drugs: ?Atorvastatin 20 mg last filled 02/02/2021 90 DS ? ?April D Calhoun, [place] ?Clinical Pharmacist Assistant ?[phone]  ?

## 2021-04-25 ENCOUNTER — Inpatient Hospital Stay (HOSPITAL_BASED_OUTPATIENT_CLINIC_OR_DEPARTMENT_OTHER): Payer: No Typology Code available for payment source | Admitting: Physician Assistant

## 2021-04-25 ENCOUNTER — Inpatient Hospital Stay: Payer: No Typology Code available for payment source

## 2021-04-25 ENCOUNTER — Other Ambulatory Visit: Payer: Self-pay

## 2021-04-25 VITALS — BP 134/74 | HR 89 | Temp 97.3°F | Resp 18 | Wt 198.9 lb

## 2021-04-25 DIAGNOSIS — C3490 Malignant neoplasm of unspecified part of unspecified bronchus or lung: Secondary | ICD-10-CM | POA: Diagnosis not present

## 2021-04-25 DIAGNOSIS — Z5112 Encounter for antineoplastic immunotherapy: Secondary | ICD-10-CM | POA: Diagnosis not present

## 2021-04-25 DIAGNOSIS — Z95828 Presence of other vascular implants and grafts: Secondary | ICD-10-CM

## 2021-04-25 LAB — CBC WITH DIFFERENTIAL (CANCER CENTER ONLY)
Abs Immature Granulocytes: 0.02 10*3/uL (ref 0.00–0.07)
Basophils Absolute: 0 10*3/uL (ref 0.0–0.1)
Basophils Relative: 0 %
Eosinophils Absolute: 0.3 10*3/uL (ref 0.0–0.5)
Eosinophils Relative: 4 %
HCT: 39.3 % (ref 36.0–46.0)
Hemoglobin: 12.2 g/dL (ref 12.0–15.0)
Immature Granulocytes: 0 %
Lymphocytes Relative: 34 %
Lymphs Abs: 2.5 10*3/uL (ref 0.7–4.0)
MCH: 27.5 pg (ref 26.0–34.0)
MCHC: 31 g/dL (ref 30.0–36.0)
MCV: 88.7 fL (ref 80.0–100.0)
Monocytes Absolute: 0.6 10*3/uL (ref 0.1–1.0)
Monocytes Relative: 8 %
Neutro Abs: 3.9 10*3/uL (ref 1.7–7.7)
Neutrophils Relative %: 54 %
Platelet Count: 157 10*3/uL (ref 150–400)
RBC: 4.43 MIL/uL (ref 3.87–5.11)
RDW: 15.6 % — ABNORMAL HIGH (ref 11.5–15.5)
WBC Count: 7.2 10*3/uL (ref 4.0–10.5)
nRBC: 0 % (ref 0.0–0.2)

## 2021-04-25 LAB — CMP (CANCER CENTER ONLY)
ALT: 15 U/L (ref 0–44)
AST: 18 U/L (ref 15–41)
Albumin: 3.6 g/dL (ref 3.5–5.0)
Alkaline Phosphatase: 78 U/L (ref 38–126)
Anion gap: 9 (ref 5–15)
BUN: 9 mg/dL (ref 8–23)
CO2: 26 mmol/L (ref 22–32)
Calcium: 8.4 mg/dL — ABNORMAL LOW (ref 8.9–10.3)
Chloride: 101 mmol/L (ref 98–111)
Creatinine: 0.92 mg/dL (ref 0.44–1.00)
GFR, Estimated: >60 mL/min (ref >60)
Glucose, Bld: 85 mg/dL (ref 70–99)
Potassium: 3.7 mmol/L (ref 3.5–5.1)
Sodium: 136 mmol/L (ref 135–145)
Total Bilirubin: 0.2 mg/dL — ABNORMAL LOW (ref 0.3–1.2)
Total Protein: 6.7 g/dL (ref 6.5–8.1)

## 2021-04-25 LAB — TSH: TSH: 3.612 u[IU]/mL (ref 0.308–3.960)

## 2021-04-25 MED ORDER — SODIUM CHLORIDE 0.9 % IV SOLN: Freq: Once | INTRAVENOUS | Status: AC

## 2021-04-25 MED ORDER — HEPARIN SOD (PORK) LOCK FLUSH 100 UNIT/ML IV SOLN
500.0000 [IU] | Freq: Once | INTRAVENOUS | Status: AC | PRN
  Administered 2021-04-25: 500 [IU]

## 2021-04-25 MED ORDER — SODIUM CHLORIDE 0.9 % IV SOLN
1200.0000 mg | Freq: Once | INTRAVENOUS | Status: AC
  Administered 2021-04-25: 1200 mg via INTRAVENOUS
  Filled 2021-04-25: qty 20

## 2021-04-25 MED ORDER — SODIUM CHLORIDE 0.9% FLUSH
10.0000 mL | INTRAVENOUS | Status: DC | PRN
  Administered 2021-04-25: 10 mL

## 2021-04-25 MED ORDER — SODIUM CHLORIDE 0.9% FLUSH
10.0000 mL | Freq: Once | INTRAVENOUS | Status: AC
  Administered 2021-04-25: 10 mL

## 2021-04-25 NOTE — Progress Notes (Signed)
?[place] ?[phone]   [phone] ? ?PROGRESS NOTE ? ?Patient Care Team: ?Allwardt, Randa Evens, PA-C as PCP - General (Physician Assistant) ?Milus Banister, MD as Attending Physician (Gastroenterology) ?Dr. Retta Mac ?Paralee Cancel, MD as Consulting Physician (Orthopedic Surgery) ?Latanya Maudlin, MD as Consulting Physician (Orthopedic Surgery) ?Kathrynn Ducking, MD (Inactive) as Consulting Physician (Neurology) ?Okey Regal, OD as Consulting Physician (Optometry) ?[name], [facility] as Pharmacist (Pharmacist) ?Cameron Sprang, MD as Consulting Physician (Neurology) ?Valrie Hart, RN as Oncology Nurse Navigator (Oncology) ?Leona Singleton, RN as [place] Management ? ?Hematological/Oncological History ?# Small Cell Lung Cancer, Extensive Stage ?07/05/2020: CT abdomen for lower abdominal pain. New pulmonary nodular density noted ?07/06/2020: CT chest showed 2.2 cm macrolobulated right lower lobe pulmonary nodule (favored) versus pathologically enlarged infrahilar lymph node ?10/13/2020: PET CT scan performed, findings show 2 cm right lower lobe lung mass is hypermetabolic and consistent with primary lung neoplasm. Additionally found hypermetabolic 17 mm soft tissue lesion between the descending duodenum and the pancreatic head  ?10/19/2020: EGD to biopsy hypermetabolic lymph node. Biopsy results show small cell lung cancer ?10/26/2020: establish care with Dr. Lorenso Courier  ?11/13/2020: Cycle 1 Day 1 of Carbo/Etop/Atezolizumab ?12/04/2020: Cycle 2 Day 1 of Carbo/Etop/Atezolizumab ?11/22-11/25/2022: admitted for E. Coli bacteremia/sepsis. Start of Cycle 3 delayed. ?01/02/2021: Cycle 3 Day 1 of Carbo/Etop/Atezolizumab ?01/23/2021: Cycle 4 Day 1 of Carbo/Etop/Atezolizumab ?02/22/2021: Cycle 5 Day 1 of Atezolizumab Maintenance. Delayed start due to patient's COVID infection.  ?03/14/2021: Cycle 6 Day 1 of Atezolizumab Maintenance ?04/05/2021: Cycle 7 Day 1 of  Atezolizumab Maintenance ?04/25/2021: Cycle 8 Day 1 of Atezolizumab Maintenance ? ?Interval History:  ?[name] 66 y.o. female with medical history significant for extensive stage small cell lung cancer who presents for a follow up visit. The patient's last visit was on 04/05/2021. In the interim since the last visit she has completed cycle 7 of chemotherapy. She presents today to start cycle 8 of chemotherapy. ? ?On exam today [name] continues to have persistent fatigue that has been present for several months. She is able to complete her daily activites. Her appetite is unchanged without any noticeable weight loss. She continues to have post prandial nausea that improves with zofran. She denies any vomiting episodes. She has intermittent episodes of lower quadrant abdominal pain. She has chronic constipation with a bowel movement  twice a week. She is planning to start stool softeners to improve her constipation. She denies easy bruising or signs of active bleeding. She reports stable shortness of breath and occasional episodes of chest discomfort. She struggles with insomnia and staying asleep. Her pain doctor started her on Wellbutrin but it caused excessive drowsiness so she discontinued on her own.  Otherwise her weight has been stable she has had no other major changes in her health.  She denies fevers, chills, night sweats, chest pain, neuropathy or peripheral edema.  She has no other complaints. A full 10 point ROS is listed below. ? ? ?MEDICAL HISTORY:  ?Past Medical History:  ?Diagnosis Date  ? Allergic rhinitis   ? Anemia   ? Anxiety   ? Chicken pox   ? Chronic back pain   ? COPD (chronic obstructive pulmonary disease) ([place])   ? Depression   ? DM (diabetes mellitus) ([place])   ? Essential hypertension   ? GERD (gastroesophageal reflux disease)   ? Headache   ? migraines  ? History of gastritis   ? EGD 2015  ?  History of home oxygen therapy   ? 2 liters at hs last 6 months  ? Hyperlipidemia   ?  Hypothyroidism   ? Migraines   ? Osteoarthritis   ? oa  ? Scoliosis   ? ? ?SURGICAL HISTORY: ?Past Surgical History:  ?Procedure Laterality Date  ? APPENDECTOMY    ? 1985  ? BIOPSY  07/24/2017  ? Procedure: BIOPSY;  Surgeon: Milus Banister, MD;  Location: [facility];  Service: Endoscopy;;  ? CARDIAC CATHETERIZATION N/A 10/31/2015  ? Procedure: Left Heart Cath and Coronary Angiography;  Surgeon: Leonie Man, MD;  Location: [facility];  Service: Cardiovascular;  Laterality: N/A;  ? CARPAL TUNNEL RELEASE Left   ? CARPAL TUNNEL RELEASE Right   ? CHOLECYSTECTOMY  late 1980's  ? COLONOSCOPY WITH PROPOFOL N/A 07/24/2017  ? Procedure: COLONOSCOPY WITH PROPOFOL;  Surgeon: Milus Banister, MD;  Location: [facility];  Service: Endoscopy;  Laterality: N/A;  ? ESOPHAGOGASTRODUODENOSCOPY N/A 07/24/2017  ? Procedure: ESOPHAGOGASTRODUODENOSCOPY (EGD);  Surgeon: Milus Banister, MD;  Location: [facility];  Service: Endoscopy;  Laterality: N/A;  ? ESOPHAGOGASTRODUODENOSCOPY (EGD) WITH PROPOFOL N/A 10/19/2020  ? Procedure: ESOPHAGOGASTRODUODENOSCOPY (EGD) WITH PROPOFOL;  Surgeon: Milus Banister, MD;  Location: [facility];  Service: Endoscopy;  Laterality: N/A;  ? FINE NEEDLE ASPIRATION N/A 10/19/2020  ? Procedure: FINE NEEDLE ASPIRATION (FNA) LINEAR;  Surgeon: Milus Banister, MD;  Location: [facility];  Service: Endoscopy;  Laterality: N/A;  ? [place]  ? HIP CLOSED REDUCTION Right 01/08/2016  ? Procedure: CLOSED MANIPULATION HIP;  Surgeon: Susa Day, MD;  Location: [facility];  Service: Orthopedics;  Laterality: Right;  ? HIP CLOSED REDUCTION Right 01/19/2016  ? Procedure: ATTEMPTED CLOSED REDUCTION RIGHT HIP;  Surgeon: Wylene Simmer, MD;  Location: [facility];  Service: Orthopedics;  Laterality: Right;  ? HIP CLOSED REDUCTION Right 01/20/2016  ? Procedure: CLOSED REDUCTION RIGHT TOTAL HIP;  Surgeon: Paralee Cancel, MD;  Location: [facility];  Service: Orthopedics;  Laterality: Right;  ? HIP CLOSED  REDUCTION Right 02/17/2016  ? Procedure: CLOSED REDUCTION RIGHT TOTAL HIP;  Surgeon: Rod Can, MD;  Location: [facility];  Service: Orthopedics;  Laterality: Right;  ? HIP CLOSED REDUCTION Right 02/28/2016  ? Procedure: CLOSED REDUCTION HIP;  Surgeon: Nicholes Stairs, MD;  Location: [facility];  Service: Orthopedics;  Laterality: Right;  ? IR IMAGING GUIDED PORT INSERTION  11/01/2020  ? POLYPECTOMY  07/24/2017  ? Procedure: POLYPECTOMY;  Surgeon: Milus Banister, MD;  Location: [facility];  Service: Endoscopy;;  ? TONSILLECTOMY    ? TOTAL ABDOMINAL HYSTERECTOMY    ? 1985, with 1 ovary removed and 2 nd ovary removed 2003  ? TOTAL HIP ARTHROPLASTY Right   ? Original surgery 2006 with revision 2010  ? TOTAL HIP REVISION Right 01/01/2016  ? Procedure: TOTAL HIP REVISION;  Surgeon: Paralee Cancel, MD;  Location: [facility];  Service: Orthopedics;  Laterality: Right;  ? TOTAL HIP REVISION Right 03/02/2016  ? Procedure: TOTAL HIP REVISION;  Surgeon: Paralee Cancel, MD;  Location: [facility];  Service: Orthopedics;  Laterality: Right;  ? TOTAL HIP REVISION Right 09/02/2016  ? Procedure: Right hip constrained liner- posterior;  Surgeon: Paralee Cancel, MD;  Location: [facility];  Service: Orthopedics;  Laterality: Right;  ? ULNAR NERVE TRANSPOSITION Right   ? UPPER ESOPHAGEAL ENDOSCOPIC ULTRASOUND (EUS) N/A 10/19/2020  ? Procedure: UPPER ESOPHAGEAL ENDOSCOPIC ULTRASOUND (EUS);  Surgeon: Milus Banister, MD;  Location: [facility];  Service: Endoscopy;  Laterality: N/A;  periduodenal lesion  ? ? ?SOCIAL HISTORY: ?Social History  ? ?Socioeconomic History  ? Marital status: Married  ?  Spouse name: Not on file  ? Number of children: 2  ? Years of education: Not on file  ? Highest education level: Not on file  ?Occupational History  ? Occupation: disabled  ? Occupation: disabled  ?Tobacco Use  ? Smoking status: Every Day  ?  Packs/day: 2.00  ?  Years: 46.00  ?  Pack years: 92.00  ?  Types: Cigarettes  ? Smokeless tobacco: Never  ? Tobacco  comments:  ?  2 packs of cigarettes smoked daily ARJ 04/10/21  ?Vaping Use  ? Vaping Use: Never used  ?Substance and Sexual Activity  ? Alcohol use: No  ? Drug use: No  ? Sexual activity: Not Currently  ?  Partners: Male  ?Other

## 2021-04-25 NOTE — Patient Instructions (Signed)
[facility]  Discharge Instructions: ?Thank you for choosing [facility] to provide your oncology and hematology care.  ? ?If you have a lab appointment with the [facility], please go directly to the [facility] and check in at the registration area. ?  ?Wear comfortable clothing and clothing appropriate for easy access to any Portacath or PICC line.  ? ?We strive to give you quality time with your provider. You may need to reschedule your appointment if you arrive late (15 or more minutes).  Arriving late affects you and other patients whose appointments are after yours.  Also, if you miss three or more appointments without notifying the office, you may be dismissed from the clinic at the provider?s discretion.    ?  ?For prescription refill requests, have your pharmacy contact our office and allow 72 hours for refills to be completed.   ? ?Today you received the following chemotherapy and/or immunotherapy agents: Tecentriq.    ?  ?To help prevent nausea and vomiting after your treatment, we encourage you to take your nausea medication as directed. ? ?BELOW ARE SYMPTOMS THAT SHOULD BE REPORTED IMMEDIATELY: ?*FEVER GREATER THAN 100.4 F (38 ?C) OR HIGHER ?*CHILLS OR SWEATING ?*NAUSEA AND VOMITING THAT IS NOT CONTROLLED WITH YOUR NAUSEA MEDICATION ?*UNUSUAL SHORTNESS OF BREATH ?*UNUSUAL BRUISING OR BLEEDING ?*URINARY PROBLEMS (pain or burning when urinating, or frequent urination) ?*BOWEL PROBLEMS (unusual diarrhea, constipation, pain near the anus) ?TENDERNESS IN MOUTH AND THROAT WITH OR WITHOUT PRESENCE OF ULCERS (sore throat, sores in mouth, or a toothache) ?UNUSUAL RASH, SWELLING OR PAIN  ?UNUSUAL VAGINAL DISCHARGE OR ITCHING  ? ?Items with * indicate a potential emergency and should be followed up as soon as possible or go to the Emergency Department if any problems should occur. ? ?Please show the CHEMOTHERAPY ALERT CARD or IMMUNOTHERAPY ALERT CARD at check-in to  the Emergency Department and triage nurse. ? ?Should you have questions after your visit or need to cancel or reschedule your appointment, please contact [facility]  Dept: [phone]  and follow the prompts.  Office hours are 8:00 a.m. to 4:30 p.m. Monday - Friday. Please note that voicemails left after 4:00 p.m. may not be returned until the following business day.  We are closed weekends and major holidays. You have access to a nurse at all times for urgent questions. Please call the main number to the clinic Dept: [phone] and follow the prompts. ? ? ?For any non-urgent questions, you may also contact your provider using MyChart. We now offer e-Visits for anyone 64 and older to request care online for non-urgent symptoms. For details visit [email or web address]. ?  ?Also download the MyChart app! Go to the app store, search "MyChart", open the app, select [facility], and log in with your MyChart username and password. ? ?Due to Covid, a mask is required upon entering the hospital/clinic. If you do not have a mask, one will be given to you upon arrival. For doctor visits, patients may have 1 support person aged 59 or older with them. For treatment visits, patients cannot have anyone with them due to current Covid guidelines and our immunocompromised population.  ? ?

## 2021-04-27 ENCOUNTER — Other Ambulatory Visit: Payer: Self-pay | Admitting: Physical Medicine and Rehabilitation

## 2021-04-27 DIAGNOSIS — G894 Chronic pain syndrome: Secondary | ICD-10-CM

## 2021-04-27 MED ORDER — HYDROCODONE-ACETAMINOPHEN 10-325 MG PO TABS: 1.0000 | ORAL_TABLET | ORAL | 0 refills | Status: DC | PRN

## 2021-04-28 DIAGNOSIS — J449 Chronic obstructive pulmonary disease, unspecified: Secondary | ICD-10-CM | POA: Diagnosis not present

## 2021-05-02 NOTE — Progress Notes (Signed)
? ?Chronic Care Management ?Pharmacy Note ? ?05/07/2021 ?Name:  [name] MRN:  [id number] DOB:  [birth year] ? ?Summary: ?Pharmd FU.  Copay on inhalers is burden.  [place]ved for [place] through [employer].  Can receive Breztri at no cost.  Reports daily use of Albuterol.  Severe metastatic lung cancer. ? ?Recommendations/Changes made from today's visit: ?Rx for Breztri from [facility] ? ?Plan: ?FU in 30 days to make sure she got med ? ? ?Subjective: ?[name] is an 66 y.o. year old female who is a primary patient of Allwardt, Alyssa M, PA-C.  The CCM team was consulted for assistance with disease management and care coordination needs.   ? ?Engaged with patient by telephone for follow up visit in response to provider referral for pharmacy case management and/or care coordination services.  ? ?Consent to Services:  ?The patient was given the following information about Chronic Care Management services today, agreed to services, and gave verbal consent: 1. CCM service includes personalized support from designated clinical staff supervised by the primary care provider, including individualized plan of care and coordination with other care providers 2. 24/7 contact phone numbers for assistance for urgent and routine care needs. 3. Service will only be billed when office clinical staff spend 20 minutes or more in a month to coordinate care. 4. Only one practitioner may furnish and bill the service in a calendar month. 5.The patient may stop CCM services at any time (effective at the end of the month) by phone call to the office staff. 6. The patient will be responsible for cost sharing (co-pay) of up to 20% of the service fee (after annual deductible is met). Patient agreed to services and consent obtained. ? ?Patient Care Team: ?Allwardt, Randa Evens, PA-C as PCP - General (Physician Assistant) ?Milus Banister, MD as Attending Physician (Gastroenterology) ?Dr. Retta Mac ?Paralee Cancel, MD as Consulting Physician  (Orthopedic Surgery) ?Latanya Maudlin, MD as Consulting Physician (Orthopedic Surgery) ?Kathrynn Ducking, MD (Inactive) as Consulting Physician (Neurology) ?Okey Regal, OD as Consulting Physician (Optometry) ?Cameron Sprang, MD as Consulting Physician (Neurology) ?Valrie Hart, RN as Oncology Nurse Navigator (Oncology) ?Leona Singleton, RN as [place] Management ?Edythe Clarity, [facility] (Pharmacist) ? ?Recent office visits:  ?04/13/2021 OV (PCP) Allwardt, Randa Evens, PA-C; -Also talked about Myrbetriq.  We can try to get this covered with her insurance and see if this can help some of her symptoms as well, also to improve quality of life. ?  ?02/13/2021 VV Lucretia Kern, DO; molnupiravir 200 mg bid x 5 days. ?  ?01/05/2021 OV (PCP) Allwardt, Alyssa M, PA-C; no medication changes indicated. ?  ?Recent consult visits:  ?04/10/2021 [facility] (Pulmonology) Margaretha Seeds, MD;  ?--Will provide Trelegy samples. ?--START Advair 250-50 mcg ONE puff TWICE a day ?  ?04/05/2021 [facility] (Oncology) Orson Slick, MD; --today is Cycle 7 Day 1 of Atezolizumab maintenance. ?  ?03/14/2021 [facility] (Oncology) Darrel Reach, PA-C; --today is Cycle 6 Day 1 of Atezolizumab maintenance. ?  ?03/05/2021 [facility] (Physical Medicine) Courtney Heys, MD;  will increase Norco to 5 tabs/day- has Rx for 4x/day just sent this AM- take 5x/day and call me when current Rx will run out and will refill at 5x/day.  Add Wellbutrin 150 mg BID- take in AM and dinner time-  waking up early in AM due to depression ?  ?02/22/2021 OV (Oncology) Orson Slick, MD; --today is Cycle 5 Day 1 of  Atezolizumab maintenance ?  ?01/23/2021 OV (Oncology) Orson Slick, MD; --today is Cycle 4 Day 1 of Carbo/Etop/Atezolizumab ?  ?01/02/2021 OV (Oncology) Orson Slick, MD; --today is Cycle 3 Day 1 of Carbo/Etop/Atezolizumab ?  ?12/04/2020 OV (Oncology) Orson Slick, MD; --today is Cycle 2 Day 1 of Carbo/Etop/Atezolizumab ?  ?11/13/2020 OV  (Oncology) Orson Slick, MD; --today is Cycle 1 Day 1 of Carbo/Etop/Atezolizumab, --GCSF shot on Day 3 ?  ?11/10/2020 OV (Pulmonology) Margaretha Seeds, MD; --INCREASE Trelegy 200-62.5-25 mcg ONE puff ONCE a day ?--CONTINUE Albuterol AS NEEDED ?  ?Hospital visits:  ?12/19/2020 ED to Hospital Admission due to sepsis at [facility] date: 12/19/2020 ?Discharge date: 12/22/2020 ?# Sepsis present on admission secondary to E. coli bacteremia suspected E. coli UTI: ?-10 more days of Keflex by mouth ?- Suspected atypical pneumonia, however less likely.  Discontinue azithromycin. ?-  Reduced dose of thyroxine to 125 mcg daily from 150 mcg daily.  We will need recheck in 4 weeks. ?-  Will prescribe 2 weeks of magnesium oxide 400 mg daily ? ? ?Objective: ? ?Lab Results  ?Component Value Date  ? CREATININE 0.92 04/25/2021  ? BUN 9 04/25/2021  ? GFR 61.04 06/28/2020  ? EGFR 70 09/13/2020  ? GFRNONAA >60 04/25/2021  ? GFRAA >60 07/07/2019  ? NA 136 04/25/2021  ? K 3.7 04/25/2021  ? CALCIUM 8.4 (L) 04/25/2021  ? CO2 26 04/25/2021  ? GLUCOSE 85 04/25/2021  ? ? ?Lab Results  ?Component Value Date/Time  ? HGBA1C 6.5 01/05/2021 01:13 PM  ? HGBA1C 6.7 (H) 09/04/2020 03:35 PM  ? GFR 61.04 06/28/2020 04:19 PM  ? GFR 61.84 05/25/2020 12:30 PM  ? MICROALBUR 0.4 03/12/2019 01:54 PM  ?  ?Last diabetic Eye exam: No results found for: HMDIABEYEEXA  ?Last diabetic Foot exam: No results found for: HMDIABFOOTEX  ? ?Lab Results  ?Component Value Date  ? CHOL 98 11/30/2019  ? HDL 31.90 (L) 11/30/2019  ? Harris 27 11/30/2019  ? LDLDIRECT 72.0 08/31/2018  ? TRIG 193.0 (H) 11/30/2019  ? CHOLHDL 3 11/30/2019  ? ? ? ?  Latest Ref Rng & Units 04/25/2021  ?  2:36 PM 04/05/2021  ?  1:37 PM 03/14/2021  ? 12:33 PM  ?Hepatic Function  ?Total Protein 6.5 - 8.1 g/dL 6.7   6.2   6.2    ?Albumin 3.5 - 5.0 g/dL 3.6   3.6   3.4    ?AST 15 - 41 U/L 18   17   20     ?ALT 0 - 44 U/L 15   12   8     ?Alk Phosphatase 38 - 126 U/L 78   79   78    ?Total  Bilirubin 0.3 - 1.2 mg/dL 0.2   0.3   0.4    ? ? ?Lab Results  ?Component Value Date/Time  ? TSH 3.612 04/25/2021 02:36 PM  ? TSH 1.382 04/05/2021 01:37 PM  ? TSH 0.43 09/04/2020 03:35 PM  ? TSH 4.39 11/04/2019 01:42 PM  ? FREET4 0.81 02/19/2019 03:51 PM  ? ? ? ?  Latest Ref Rng & Units 04/25/2021  ?  2:36 PM 04/05/2021  ?  1:37 PM 03/14/2021  ? 12:33 PM  ?CBC  ?WBC 4.0 - 10.5 K/uL 7.2   6.6   9.1    ?Hemoglobin 12.0 - 15.0 g/dL 12.2   11.5   10.6    ?Hematocrit 36.0 - 46.0 % 39.3  36.9   35.2    ?Platelets 150 - 400 K/uL 157   154   146    ? ? ?No results found for: VD25OH ? ?Clinical ASCVD: No  ?The ASCVD Risk score (Arnett DK, et al., 2019) failed to calculate for the following reasons: ?  The valid total cholesterol range is 130 to 320 mg/dL   ? ? ?  05/03/2021  ?  1:35 PM 03/05/2021  ?  2:26 PM 03/05/2021  ?  2:25 PM  ?Depression screen PHQ 2/9  ?Decreased Interest 0 0 0  ?Down, Depressed, Hopeless 1 0 0  ?PHQ - 2 Score 1 0 0  ?Altered sleeping 0    ?Tired, decreased energy 2    ?Change in appetite 0    ?Feeling bad or failure about yourself  0    ?Trouble concentrating 1    ?Moving slowly or fidgety/restless 0    ?Suicidal thoughts 0    ?PHQ-9 Score 4    ?Difficult doing work/chores Somewhat difficult    ?  ? ? ?Social History  ? ?Tobacco Use  ?Smoking Status Every Day  ? Packs/day: 2.00  ? Years: 46.00  ? Pack years: 92.00  ? Types: Cigarettes  ?Smokeless Tobacco Never  ?Tobacco Comments  ? 2 packs of cigarettes smoked daily ARJ 04/10/21  ? ?BP Readings from Last 3 Encounters:  ?04/25/21 134/74  ?04/13/21 110/76  ?04/10/21 120/70  ? ?Pulse Readings from Last 3 Encounters:  ?04/25/21 89  ?04/13/21 88  ?04/10/21 86  ? ?Wt Readings from Last 3 Encounters:  ?04/25/21 198 lb 14.4 oz (90.2 kg)  ?04/13/21 196 lb 3.2 oz (89 kg)  ?04/10/21 197 lb 6.4 oz (89.5 kg)  ? ?BMI Readings from Last 3 Encounters:  ?04/25/21 34.14 kg/m?  ?04/13/21 33.68 kg/m?  ?04/10/21 33.88 kg/m?  ? ? ?Assessment/Interventions: Review of patient past  medical history, allergies, medications, health status, including review of consultants reports, laboratory and other test data, was performed as part of comprehensive evaluation and provision of chronic car

## 2021-05-03 ENCOUNTER — Ambulatory Visit (INDEPENDENT_AMBULATORY_CARE_PROVIDER_SITE_OTHER): Payer: No Typology Code available for payment source

## 2021-05-03 DIAGNOSIS — Z0000 Encounter for general adult medical examination without abnormal findings: Secondary | ICD-10-CM

## 2021-05-03 NOTE — Patient Instructions (Signed)
[name] , ?Thank you for taking time to come for your Medicare Wellness Visit. I appreciate your ongoing commitment to your health goals. Please review the following plan we discussed and let me know if I can assist you in the future.  ? ?Screening recommendations/referrals: ?Colonoscopy: Done 6//27/19 repeat every 10 years  ?Mammogram: Declined at this time  ?Bone Density: Done 06/12/17  ?Recommended yearly ophthalmology/optometry visit for glaucoma screening and checkup ?Recommended yearly dental visit for hygiene and checkup ? ?Vaccinations: ?Influenza vaccine: Done 11/15/20 ?Pneumococcal vaccine: Up to date ?Tdap vaccine: pt stated complete unsure of dates  ?Shingles vaccine: Shingrix discussed. Please contact your pharmacy for coverage information.    ?Covid-19:Completed 9/22, 11/10/19 & 05/17/20 ? ?Advanced directives: Copies in chart  ? ?Conditions/risks identified: eat healthier  ? ?Next appointment: Follow up in one year for your annual wellness visit  ? ? ?Preventive Care 55 Years and Older, Female ?Preventive care refers to lifestyle choices and visits with your health care provider that can promote health and wellness. ?What does preventive care include? ?A yearly physical exam. This is also called an annual well check. ?Dental exams once or twice a year. ?Routine eye exams. Ask your health care provider how often you should have your eyes checked. ?Personal lifestyle choices, including: ?Daily care of your teeth and gums. ?Regular physical activity. ?Eating a healthy diet. ?Avoiding tobacco and drug use. ?Limiting alcohol use. ?Practicing safe sex. ?Taking low-dose aspirin every day. ?Taking vitamin and mineral supplements as recommended by your health care provider. ?What happens during an annual well check? ?The services and screenings done by your health care provider during your annual well check will depend on your age, overall health, lifestyle risk factors, and family history of  disease. ?Counseling  ?Your health care provider may ask you questions about your: ?Alcohol use. ?Tobacco use. ?Drug use. ?Emotional well-being. ?Home and relationship well-being. ?Sexual activity. ?Eating habits. ?History of falls. ?Memory and ability to understand (cognition). ?Work and work Statistician. ?Reproductive health. ?Screening  ?You may have the following tests or measurements: ?Height, weight, and BMI. ?Blood pressure. ?Lipid and cholesterol levels. These may be checked every 5 years, or more frequently if you are over 66 years old. ?Skin check. ?Lung cancer screening. You may have this screening every year starting at age 22 if you have a 30-pack-year history of smoking and currently smoke or have quit within the past 15 years. ?Fecal occult blood test (FOBT) of the stool. You may have this test every year starting at age 55. ?Flexible sigmoidoscopy or colonoscopy. You may have a sigmoidoscopy every 5 years or a colonoscopy every 10 years starting at age 41. ?Hepatitis C blood test. ?Hepatitis B blood test. ?Sexually transmitted disease (STD) testing. ?Diabetes screening. This is done by checking your blood sugar (glucose) after you have not eaten for a while (fasting). You may have this done every 1-3 years. ?Bone density scan. This is done to screen for osteoporosis. You may have this done starting at age 82. ?Mammogram. This may be done every 1-2 years. Talk to your health care provider about how often you should have regular mammograms. ?Talk with your health care provider about your test results, treatment options, and if necessary, the need for more tests. ?Vaccines  ?Your health care provider may recommend certain vaccines, such as: ?Influenza vaccine. This is recommended every year. ?Tetanus, diphtheria, and acellular pertussis (Tdap, Td) vaccine. You may need a Td booster every 10 years. ?Zoster vaccine. You may need  this after age 62. ?Pneumococcal 13-valent conjugate (PCV13) vaccine. One  dose is recommended after age 41. ?Pneumococcal polysaccharide (PPSV23) vaccine. One dose is recommended after age 68. ?Talk to your health care provider about which screenings and vaccines you need and how often you need them. ?This information is not intended to replace advice given to you by your health care provider. Make sure you discuss any questions you have with your health care provider. ?Document Released: 02/10/2015 Document Revised: 10/04/2015 Document Reviewed: 11/15/2014 ?Elsevier Interactive Patient Education ? 2017 [employer]. ? ?Fall Prevention in the Home ?Falls can cause injuries. They can happen to people of all ages. There are many things you can do to make your home safe and to help prevent falls. ?What can I do on the outside of my home? ?Regularly fix the edges of walkways and driveways and fix any cracks. ?Remove anything that might make you trip as you walk through a door, such as a raised step or threshold. ?Trim any bushes or trees on the path to your home. ?Use bright outdoor lighting. ?Clear any walking paths of anything that might make someone trip, such as rocks or tools. ?Regularly check to see if handrails are loose or broken. Make sure that both sides of any steps have handrails. ?Any raised decks and porches should have guardrails on the edges. ?Have any leaves, snow, or ice cleared regularly. ?Use sand or salt on walking paths during winter. ?Clean up any spills in your garage right away. This includes oil or grease spills. ?What can I do in the bathroom? ?Use night lights. ?Install grab bars by the toilet and in the tub and shower. Do not use towel bars as grab bars. ?Use non-skid mats or decals in the tub or shower. ?If you need to sit down in the shower, use a plastic, non-slip stool. ?Keep the floor dry. Clean up any water that spills on the floor as soon as it happens. ?Remove soap buildup in the tub or shower regularly. ?Attach bath mats securely with double-sided  non-slip rug tape. ?Do not have throw rugs and other things on the floor that can make you trip. ?What can I do in the bedroom? ?Use night lights. ?Make sure that you have a light by your bed that is easy to reach. ?Do not use any sheets or blankets that are too big for your bed. They should not hang down onto the floor. ?Have a firm chair that has side arms. You can use this for support while you get dressed. ?Do not have throw rugs and other things on the floor that can make you trip. ?What can I do in the kitchen? ?Clean up any spills right away. ?Avoid walking on wet floors. ?Keep items that you use a lot in easy-to-reach places. ?If you need to reach something above you, use a strong step stool that has a grab bar. ?Keep electrical cords out of the way. ?Do not use floor polish or wax that makes floors slippery. If you must use wax, use non-skid floor wax. ?Do not have throw rugs and other things on the floor that can make you trip. ?What can I do with my stairs? ?Do not leave any items on the stairs. ?Make sure that there are handrails on both sides of the stairs and use them. Fix handrails that are broken or loose. Make sure that handrails are as long as the stairways. ?Check any carpeting to make sure that it is firmly attached to  the stairs. Fix any carpet that is loose or worn. ?Avoid having throw rugs at the top or bottom of the stairs. If you do have throw rugs, attach them to the floor with carpet tape. ?Make sure that you have a light switch at the top of the stairs and the bottom of the stairs. If you do not have them, ask someone to add them for you. ?What else can I do to help prevent falls? ?Wear shoes that: ?Do not have high heels. ?Have rubber bottoms. ?Are comfortable and fit you well. ?Are closed at the toe. Do not wear sandals. ?If you use a stepladder: ?Make sure that it is fully opened. Do not climb a closed stepladder. ?Make sure that both sides of the stepladder are locked into place. ?Ask  someone to hold it for you, if possible. ?Clearly mark and make sure that you can see: ?Any grab bars or handrails. ?First and last steps. ?Where the edge of each step is. ?Use tools that help you move around (mobility aids)

## 2021-05-03 NOTE — Progress Notes (Signed)
Virtual Visit via Telephone Note ? ?I connected with  [name] on 05/03/21 at  1:30 PM EDT by telephone and verified that I am speaking with the correct [name] using two identifiers. ? ?Medicare Annual Wellness visit completed telephonically due to Covid-19 pandemic.  ? ?Persons participating in this call: This Health Coach and this patient.  ? ?Location: ?Patient: Home  ?Provider: Office  ?  ?I discussed the limitations, risks, security and privacy concerns of performing an evaluation and management service by telephone and the availability of in [name] appointments. The patient expressed understanding and agreed to proceed. ? ?Unable to perform video visit due to video visit attempted and failed and/or patient does not have video capability.  ? ?Some vital signs may be absent or patient reported.  ? ?Willette Brace, LPN ? ? ?Subjective:  ? [name] is a 66 y.o. female who presents for Medicare Annual (Subsequent) preventive examination. ? ?Review of Systems    ? ?Cardiac Risk Factors include: advanced age (>89men, >26 women);hypertension;diabetes mellitus;dyslipidemia;smoking/ tobacco exposure;obesity (BMI >30kg/m2) ? ?   ?Objective:  ?  ?There were no vitals filed for this visit. ?There is no height or weight on file to calculate BMI. ? ? ?  05/03/2021  ?  1:38 PM 02/22/2021  ?  1:51 PM 12/19/2020  ?  7:03 PM 12/18/2020  ?  2:04 PM 12/06/2020  ?  2:21 PM 10/19/2020  ?  8:32 AM 12/27/2019  ?  1:38 PM  ?Advanced Directives  ?Does Patient Have a Medical Advance Directive? Yes Yes Yes Yes No Yes Yes  ?Type of Paramedic of [place];Living will [place];Living will  [place];Living will;Out of facility DNR (pink MOST or yellow form) [place];Living will  ?Does patient want to make changes to medical advance directive?  No - Patient declined No - Patient declined      ?Copy of [place] in Chart? Yes - validated most recent copy scanned in chart (See row information)  No - copy requested No - copy requested  No - copy requested Yes - validated most recent copy scanned in chart (See row information)  ?Would patient like information on creating a medical advance directive?   No - Patient declined  No - Patient declined    ? ? ?Current Medications (verified) ?Outpatient Encounter Medications as of 05/03/2021  ?Medication Sig  ? albuterol (PROAIR HFA) 108 (90 Base) MCG/ACT inhaler 2 puffs every 4 hours as needed only  if your can't catch your breath  ? ALPRAZolam (XANAX) 1 MG tablet Take 1 tablet po TID as needed for anxiety.  ? atorvastatin (LIPITOR) 20 MG tablet TAKE 1 TABLET BY MOUTH AT BEDTIME  ? benzonatate (TESSALON) 100 MG capsule Take 1 capsule (100 mg total) by mouth 3 (three) times daily.  ? cyclobenzaprine (FLEXERIL) 10 MG tablet Take 1 tablet (10 mg total) by mouth 3 (three) times daily as needed for muscle spasms.  ? DULoxetine (CYMBALTA) 60 MG capsule TAKE TWO CAPSULES BY MOUTH DAILY  ? fluticasone (FLONASE) 50 MCG/ACT nasal spray Place 2 sprays into both nostrils daily.  ? fluticasone-salmeterol (ADVAIR DISKUS) 250-50 MCG/ACT AEPB Inhale 1 puff into the lungs in the morning and at bedtime.  ? furosemide (LASIX) 20 MG tablet TAKE 1 TABLET BY MOUTH EVERY DAY AS NEEDED  ? HYDROcodone-acetaminophen (NORCO) 10-325 MG tablet Take 1 tablet by mouth every 4 (  four) hours as needed (max 5 tabs/day).  ? loratadine (CLARITIN) 10 MG tablet Take 10 mg by mouth daily.  ? omeprazole (PRILOSEC) 40 MG capsule TAKE ONE CAPSULE BY MOUTH TWICE DAILY  ? ondansetron (ZOFRAN) 8 MG tablet Take 2 tablets (16 mg total) by mouth 2 (two) times daily as needed.  ? OXYGEN Inhale 3 L into the lungs at bedtime. Uses At Night  ? potassium chloride (KLOR-CON M) 10 MEQ tablet TAKE 1 TABLET BY MOUTH AT BEDTIME  ? triamterene-hydrochlorothiazide (MAXZIDE-25) 37.5-25 MG tablet TAKE 1 TABLET BY MOUTH DAILY - EMERGENCY  REFILL FAXED DR.  ? levothyroxine (SYNTHROID) 125 MCG tablet Take 1 tablet (125 mcg total) by mouth daily before breakfast.  ? prochlorperazine (COMPAZINE) 10 MG tablet Take 1 tablet (10 mg total) by mouth every 6 (six) hours as needed for nausea or vomiting. (Patient not taking: Reported on 05/03/2021)  ? ?No facility-administered encounter medications on file as of 05/03/2021.  ? ? ?Allergies (verified) ?Metformin and related, Nsaids, Wellbutrin [bupropion], Aleve [naproxen sodium], Codeine, Penicillins, and Sulfonamide derivatives  ? ?History: ?Past Medical History:  ?Diagnosis Date  ? Allergic rhinitis   ? Anemia   ? Anxiety   ? Chicken pox   ? Chronic back pain   ? COPD (chronic obstructive pulmonary disease) ([place])   ? Depression   ? DM (diabetes mellitus) ([place])   ? Essential hypertension   ? GERD (gastroesophageal reflux disease)   ? Headache   ? migraines  ? History of gastritis   ? EGD 2015  ? History of home oxygen therapy   ? 2 liters at hs last 6 months  ? Hyperlipidemia   ? Hypothyroidism   ? Migraines   ? Osteoarthritis   ? oa  ? Scoliosis   ? ?Past Surgical History:  ?Procedure Laterality Date  ? APPENDECTOMY    ? 1985  ? BIOPSY  07/24/2017  ? Procedure: BIOPSY;  Surgeon: Milus Banister, MD;  Location: [facility];  Service: Endoscopy;;  ? CARDIAC CATHETERIZATION N/A 10/31/2015  ? Procedure: Left Heart Cath and Coronary Angiography;  Surgeon: Leonie Man, MD;  Location: [facility];  Service: Cardiovascular;  Laterality: N/A;  ? CARPAL TUNNEL RELEASE Left   ? CARPAL TUNNEL RELEASE Right   ? CHOLECYSTECTOMY  late 1980's  ? COLONOSCOPY WITH PROPOFOL N/A 07/24/2017  ? Procedure: COLONOSCOPY WITH PROPOFOL;  Surgeon: Milus Banister, MD;  Location: [facility];  Service: Endoscopy;  Laterality: N/A;  ? ESOPHAGOGASTRODUODENOSCOPY N/A 07/24/2017  ? Procedure: ESOPHAGOGASTRODUODENOSCOPY (EGD);  Surgeon: Milus Banister, MD;  Location: [facility];  Service: Endoscopy;  Laterality: N/A;  ?  ESOPHAGOGASTRODUODENOSCOPY (EGD) WITH PROPOFOL N/A 10/19/2020  ? Procedure: ESOPHAGOGASTRODUODENOSCOPY (EGD) WITH PROPOFOL;  Surgeon: Milus Banister, MD;  [facility]: [facility];  Service: Endoscopy;  Laterality: N/A;  ? FINE NEEDLE ASPIRATION N/A 10/19/2020  ? Procedure: FINE NEEDLE ASPIRATION (FNA) LINEAR;  Surgeon: Milus Banister, MD;  Location: [facility];  Service: Endoscopy;  Laterality: N/A;  ? [place]  ? HIP CLOSED REDUCTION Right 01/08/2016  ? Procedure: CLOSED MANIPULATION HIP;  Surgeon: Susa Day, MD;  [facility]: [facility];  Service: Orthopedics;  Laterality: Right;  ? HIP CLOSED REDUCTION Right 01/19/2016  ? Procedure: ATTEMPTED CLOSED REDUCTION RIGHT HIP;  Surgeon: Wylene Simmer, MD;  [facility];  Service: Orthopedics;  Laterality: Right;  ? HIP CLOSED REDUCTION Right 01/20/2016  ? Procedure: CLOSED REDUCTION RIGHT TOTAL HIP;  Surgeon: Paralee Cancel, MD;  [facility];  Service: Orthopedics;  Laterality: Right;  ? HIP CLOSED REDUCTION Right 02/17/2016  ? Procedure: CLOSED REDUCTION RIGHT TOTAL HIP;  Surgeon: Rod Can, MD;  Location: [facility];  Service: Orthopedics;  Laterality: Right;  ? HIP CLOSED REDUCTION Right 02/28/2016  ? Procedure: CLOSED REDUCTION HIP;  Surgeon: Nicholes Stairs, MD;  [facility];  Service: Orthopedics;  Laterality: Right;  ? IR IMAGING GUIDED PORT INSERTION  11/01/2020  ? POLYPECTOMY  07/24/2017  ? Procedure: POLYPECTOMY;  Surgeon: Milus Banister, MD;  Location: [facility];  Service: Endoscopy;;  ? TONSILLECTOMY    ? TOTAL ABDOMINAL HYSTERECTOMY    ? 1985, with 1 ovary removed and 2 nd ovary removed 2003  ? TOTAL HIP ARTHROPLASTY Right   ? Original surgery 2006 with revision 2010  ? TOTAL HIP REVISION Right 01/01/2016  ? Procedure: TOTAL HIP REVISION;  Surgeon: Paralee Cancel, MD;  [facility];  Service: Orthopedics;  Laterality: Right;  ? TOTAL HIP REVISION Right 03/02/2016  ? Procedure: TOTAL HIP REVISION;  Surgeon: Paralee Cancel, MD;   Location[facility];  Service: Orthopedics;  Laterality: Right;  ? TOTAL HIP REVISION Right 09/02/2016  ? Procedure: Right hip constrained liner- posterior;  Surgeon: Paralee Cancel, MD;  Location: [facility];  Service:

## 2021-05-05 ENCOUNTER — Other Ambulatory Visit: Payer: Self-pay | Admitting: Hematology and Oncology

## 2021-05-07 ENCOUNTER — Ambulatory Visit (INDEPENDENT_AMBULATORY_CARE_PROVIDER_SITE_OTHER): Payer: No Typology Code available for payment source | Admitting: Pharmacist

## 2021-05-07 DIAGNOSIS — J449 Chronic obstructive pulmonary disease, unspecified: Secondary | ICD-10-CM

## 2021-05-07 DIAGNOSIS — C3490 Malignant neoplasm of unspecified part of unspecified bronchus or lung: Secondary | ICD-10-CM

## 2021-05-07 NOTE — Patient Instructions (Addendum)
Visit Information ? ? Goals Addressed   ? ?  ?  ?  ?  ? This Visit's Progress  ?  Track and Manage My Symptoms-COPD     ?  Timeframe:  Long-Range Goal ?Priority:  High ?Start Date:  05/07/21                           ?Expected End Date: 11/06/21                     ? ?Follow Up Date 08/06/21  ?  ?Appropriate medication therapy at affordable copay.  ?  ?Why is this important?   ?Tracking your symptoms and other information about your health helps your doctor plan your care.  ?Write down the symptoms, the time of day, what you were doing and what medicine you are taking.  ?You will soon learn how to manage your symptoms.   ?  ?Notes:  ?  ? ?  ? ?Patient Care Plan: General Pharmacy (Adult)  ?  ? ?Problem Identified: HTN, Lung Cancer, DM, Hypothyroidism, COPD   ?Priority: High  ?Onset Date: 05/07/2021  ?  ? ?Long-Range Goal: Patient-Specific Goal   ?Start Date: 05/07/2021  ?Expected End Date: 11/06/2021  ?This Visit's Progress: On track  ?Priority: High  ?Note:   ?Current Barriers:  ?Unable to independently afford treatment regimen ? ?Pharmacist Clinical Goal(s):  ?Patient will verbalize ability to afford treatment regimen ?achieve adherence to monitoring guidelines and medication adherence to achieve therapeutic efficacy through collaboration with PharmD and provider.  ? ?Interventions: ?1:1 collaboration with [place], Randa Evens, PA-C regarding development and update of comprehensive plan of care as evidenced by provider attestation and co-signature ?Inter-disciplinary care team collaboration (see longitudinal plan of care) ?Comprehensive medication review performed; medication list updated in electronic medical record ? ? ?COPD (Goal: control symptoms and prevent exacerbations) ?-Not ideally controlled ?-Current treatment  ?Advair 250/50 one puff twice daily Appropriate, Query effective, ?Albuterol HFA 64mcg prn Appropriate, Effective, Safe, Accessible ?-Medications previously tried:  Trelegy ($) ? ?-MMRC/CAT score:   ? ?  03/21/2020  ?  4:24 PM  ?CAT Score  ?Total CAT Score 25  ? ? ? ?-Pulmonary function testing: Pulmonary Functions Testing Results: ? ?TLC  ?Date Value Ref Range Status  ?05/26/2017 5.32 L Final  ?  ?-Exacerbations requiring treatment in last 6 months: none ?-Patient reports consistent use of maintenance inhaler ?-Frequency of rescue inhaler use: daily ?-Counseled on Proper inhaler technique; ?Benefits of consistent maintenance inhaler use ?When to use rescue inhaler ?-Assessed patient finances. She is having difficulty affording Trelegy.  She was on samples and called in Rx for Advair because it would be cheaper.  I believe patient benefits from triple therapy.  During visit applied for [place] [employer] [place] patient assistance program for [place].  Patient was approved and can receive for free.  Will consult with pulmonology to have rx sent in.  Should receive within 10-14 business days at no cost. ? ?Lung Cancer (Goal: Reduce pain/symptoms) ?-Controlled ?-Current treatment  ?Atezolizumab Appropriate, Effective, Safe, Accessible ?-Patient experiencing nausea, has compazine and zofran which are helping per her reports. ?-Recommended to continue current medication ?Followed closely by oncology. ? ?Patient Goals/Self-Care Activities ?Patient will:  ?- take medications as prescribed as evidenced by patient report and record review ?collaborate with provider on medication access solutions ? ?Follow Up Plan: The care management team will reach out to the patient again over the next 180  days.  ? ?  ?  ? ?The patient verbalized understanding of instructions, educational materials, and care plan provided today and declined offer to receive copy of patient instructions, educational materials, and care plan.  ?Telephone follow up appointment with pharmacy team member scheduled for: 6 months ? ?Edythe Clarity, [facility]  ?Beverly Milch, PharmD ?Clinical Pharmacist  ?[place] ?([phone] ? ?

## 2021-05-08 ENCOUNTER — Telehealth: Payer: Self-pay | Admitting: Pulmonary Disease

## 2021-05-08 NOTE — Telephone Encounter (Signed)
-----   Message from Edythe Clarity, [facility] sent at 05/07/2021  4:30 PM EDT ----- ?Patient was approved for [place] and [place] for [facility].  If ok with you guys would you mind faxing in a prescription for Breztri to [phone].  She can get this for free to replace Trelegy and Advair.  If you have any questions let me know.  Once they receive rx they will ship to house at no cost within 10-14 days. ?

## 2021-05-08 NOTE — Telephone Encounter (Signed)
Please fax prescription for  Breztri TWO puffs TWICE a day ?

## 2021-05-08 NOTE — Telephone Encounter (Signed)
Please ask doc of the day or available physician to sign if they are ok with it ?

## 2021-05-09 MED ORDER — BREZTRI AEROSPHERE 160-9-4.8 MCG/ACT IN AERO: 2.0000 | INHALATION_SPRAY | Freq: Two times a day (BID) | RESPIRATORY_TRACT | 3 refills | Status: DC

## 2021-05-16 ENCOUNTER — Other Ambulatory Visit: Payer: Self-pay

## 2021-05-16 ENCOUNTER — Inpatient Hospital Stay (HOSPITAL_BASED_OUTPATIENT_CLINIC_OR_DEPARTMENT_OTHER): Payer: No Typology Code available for payment source | Admitting: Hematology and Oncology

## 2021-05-16 ENCOUNTER — Inpatient Hospital Stay: Payer: No Typology Code available for payment source

## 2021-05-16 ENCOUNTER — Inpatient Hospital Stay: Payer: No Typology Code available for payment source | Attending: Hematology and Oncology

## 2021-05-16 ENCOUNTER — Inpatient Hospital Stay: Payer: No Typology Code available for payment source | Admitting: Dietician

## 2021-05-16 VITALS — BP 160/84 | HR 88 | Temp 97.2°F | Resp 17 | Wt 195.9 lb

## 2021-05-16 DIAGNOSIS — Z596 Low income: Secondary | ICD-10-CM | POA: Diagnosis not present

## 2021-05-16 DIAGNOSIS — C3490 Malignant neoplasm of unspecified part of unspecified bronchus or lung: Secondary | ICD-10-CM

## 2021-05-16 DIAGNOSIS — F419 Anxiety disorder, unspecified: Secondary | ICD-10-CM | POA: Insufficient documentation

## 2021-05-16 DIAGNOSIS — C3431 Malignant neoplasm of lower lobe, right bronchus or lung: Secondary | ICD-10-CM | POA: Insufficient documentation

## 2021-05-16 DIAGNOSIS — E039 Hypothyroidism, unspecified: Secondary | ICD-10-CM | POA: Insufficient documentation

## 2021-05-16 DIAGNOSIS — Z880 Allergy status to penicillin: Secondary | ICD-10-CM | POA: Insufficient documentation

## 2021-05-16 DIAGNOSIS — Z95828 Presence of other vascular implants and grafts: Secondary | ICD-10-CM | POA: Diagnosis not present

## 2021-05-16 DIAGNOSIS — Z8719 Personal history of other diseases of the digestive system: Secondary | ICD-10-CM | POA: Insufficient documentation

## 2021-05-16 DIAGNOSIS — I10 Essential (primary) hypertension: Secondary | ICD-10-CM | POA: Diagnosis not present

## 2021-05-16 DIAGNOSIS — Z9049 Acquired absence of other specified parts of digestive tract: Secondary | ICD-10-CM | POA: Insufficient documentation

## 2021-05-16 DIAGNOSIS — F32A Depression, unspecified: Secondary | ICD-10-CM | POA: Insufficient documentation

## 2021-05-16 DIAGNOSIS — Z885 Allergy status to narcotic agent status: Secondary | ICD-10-CM | POA: Insufficient documentation

## 2021-05-16 DIAGNOSIS — Z8261 Family history of arthritis: Secondary | ICD-10-CM | POA: Diagnosis not present

## 2021-05-16 DIAGNOSIS — Z882 Allergy status to sulfonamides status: Secondary | ICD-10-CM | POA: Diagnosis not present

## 2021-05-16 DIAGNOSIS — Z886 Allergy status to analgesic agent status: Secondary | ICD-10-CM | POA: Diagnosis not present

## 2021-05-16 DIAGNOSIS — R519 Headache, unspecified: Secondary | ICD-10-CM | POA: Insufficient documentation

## 2021-05-16 DIAGNOSIS — Z811 Family history of alcohol abuse and dependence: Secondary | ICD-10-CM | POA: Insufficient documentation

## 2021-05-16 DIAGNOSIS — Z809 Family history of malignant neoplasm, unspecified: Secondary | ICD-10-CM | POA: Insufficient documentation

## 2021-05-16 DIAGNOSIS — R110 Nausea: Secondary | ICD-10-CM | POA: Diagnosis not present

## 2021-05-16 DIAGNOSIS — Z90721 Acquired absence of ovaries, unilateral: Secondary | ICD-10-CM | POA: Diagnosis not present

## 2021-05-16 DIAGNOSIS — E119 Type 2 diabetes mellitus without complications: Secondary | ICD-10-CM | POA: Insufficient documentation

## 2021-05-16 DIAGNOSIS — Z8249 Family history of ischemic heart disease and other diseases of the circulatory system: Secondary | ICD-10-CM | POA: Insufficient documentation

## 2021-05-16 DIAGNOSIS — F17210 Nicotine dependence, cigarettes, uncomplicated: Secondary | ICD-10-CM | POA: Insufficient documentation

## 2021-05-16 DIAGNOSIS — E785 Hyperlipidemia, unspecified: Secondary | ICD-10-CM | POA: Insufficient documentation

## 2021-05-16 DIAGNOSIS — Z820 Family history of epilepsy and other diseases of the nervous system: Secondary | ICD-10-CM | POA: Insufficient documentation

## 2021-05-16 DIAGNOSIS — Z5112 Encounter for antineoplastic immunotherapy: Secondary | ICD-10-CM | POA: Diagnosis not present

## 2021-05-16 DIAGNOSIS — Z79899 Other long term (current) drug therapy: Secondary | ICD-10-CM | POA: Diagnosis not present

## 2021-05-16 DIAGNOSIS — Z823 Family history of stroke: Secondary | ICD-10-CM | POA: Insufficient documentation

## 2021-05-16 DIAGNOSIS — J449 Chronic obstructive pulmonary disease, unspecified: Secondary | ICD-10-CM | POA: Insufficient documentation

## 2021-05-16 DIAGNOSIS — I700 Atherosclerosis of aorta: Secondary | ICD-10-CM | POA: Diagnosis not present

## 2021-05-16 DIAGNOSIS — Z836 Family history of other diseases of the respiratory system: Secondary | ICD-10-CM | POA: Insufficient documentation

## 2021-05-16 DIAGNOSIS — Z814 Family history of other substance abuse and dependence: Secondary | ICD-10-CM | POA: Insufficient documentation

## 2021-05-16 LAB — CMP (CANCER CENTER ONLY)
ALT: 14 U/L (ref 0–44)
AST: 19 U/L (ref 15–41)
Albumin: 3.6 g/dL (ref 3.5–5.0)
Alkaline Phosphatase: 77 U/L (ref 38–126)
Anion gap: 6 (ref 5–15)
BUN: 10 mg/dL (ref 8–23)
CO2: 28 mmol/L (ref 22–32)
Calcium: 8.6 mg/dL — ABNORMAL LOW (ref 8.9–10.3)
Chloride: 103 mmol/L (ref 98–111)
Creatinine: 0.96 mg/dL (ref 0.44–1.00)
GFR, Estimated: >60 mL/min (ref >60)
Glucose, Bld: 100 mg/dL — ABNORMAL HIGH (ref 70–99)
Potassium: 3.7 mmol/L (ref 3.5–5.1)
Sodium: 137 mmol/L (ref 135–145)
Total Bilirubin: 0.2 mg/dL — ABNORMAL LOW (ref 0.3–1.2)
Total Protein: 6.6 g/dL (ref 6.5–8.1)

## 2021-05-16 LAB — CBC WITH DIFFERENTIAL (CANCER CENTER ONLY)
Abs Immature Granulocytes: 0.02 10*3/uL (ref 0.00–0.07)
Basophils Absolute: 0 10*3/uL (ref 0.0–0.1)
Basophils Relative: 1 %
Eosinophils Absolute: 0.2 10*3/uL (ref 0.0–0.5)
Eosinophils Relative: 2 %
HCT: 40.2 % (ref 36.0–46.0)
Hemoglobin: 12.4 g/dL (ref 12.0–15.0)
Immature Granulocytes: 0 %
Lymphocytes Relative: 32 %
Lymphs Abs: 2.8 10*3/uL (ref 0.7–4.0)
MCH: 26.6 pg (ref 26.0–34.0)
MCHC: 30.8 g/dL (ref 30.0–36.0)
MCV: 86.1 fL (ref 80.0–100.0)
Monocytes Absolute: 0.7 10*3/uL (ref 0.1–1.0)
Monocytes Relative: 8 %
Neutro Abs: 5.1 10*3/uL (ref 1.7–7.7)
Neutrophils Relative %: 57 %
Platelet Count: 171 10*3/uL (ref 150–400)
RBC: 4.67 MIL/uL (ref 3.87–5.11)
RDW: 16.1 % — ABNORMAL HIGH (ref 11.5–15.5)
WBC Count: 8.9 10*3/uL (ref 4.0–10.5)
nRBC: 0 % (ref 0.0–0.2)

## 2021-05-16 LAB — TSH: TSH: 1.464 u[IU]/mL (ref 0.350–4.500)

## 2021-05-16 MED ORDER — SODIUM CHLORIDE 0.9% FLUSH
10.0000 mL | Freq: Once | INTRAVENOUS | Status: AC
  Administered 2021-05-16: 10 mL

## 2021-05-16 MED ORDER — OLANZAPINE 10 MG PO TABS: 5.0000 mg | ORAL_TABLET | Freq: Every day | ORAL | 2 refills | Status: DC

## 2021-05-16 MED ORDER — HEPARIN SOD (PORK) LOCK FLUSH 100 UNIT/ML IV SOLN
500.0000 [IU] | Freq: Once | INTRAVENOUS | Status: AC | PRN
  Administered 2021-05-16: 500 [IU]

## 2021-05-16 MED ORDER — SODIUM CHLORIDE 0.9 % IV SOLN
1200.0000 mg | Freq: Once | INTRAVENOUS | Status: AC
  Administered 2021-05-16: 1200 mg via INTRAVENOUS
  Filled 2021-05-16: qty 20

## 2021-05-16 MED ORDER — SODIUM CHLORIDE 0.9% FLUSH
10.0000 mL | INTRAVENOUS | Status: DC | PRN
  Administered 2021-05-16: 10 mL

## 2021-05-16 MED ORDER — SODIUM CHLORIDE 0.9 % IV SOLN: Freq: Once | INTRAVENOUS | Status: AC

## 2021-05-16 NOTE — Progress Notes (Signed)
?[place] ?[phone]   [phone] ? ?PROGRESS NOTE ? ?Patient Care Team: ?Allwardt, Randa Evens, PA-C as PCP - General (Physician Assistant) ?Milus Banister, MD as Attending Physician (Gastroenterology) ?Dr. Retta Mac ?Paralee Cancel, MD as Consulting Physician (Orthopedic Surgery) ?Latanya Maudlin, MD as Consulting Physician (Orthopedic Surgery) ?Kathrynn Ducking, MD (Inactive) as Consulting Physician (Neurology) ?Okey Regal, OD as Consulting Physician (Optometry) ?Cameron Sprang, MD as Consulting Physician (Neurology) ?Valrie Hart, RN as Oncology Nurse Navigator (Oncology) ?Leona Singleton, RN as [facility] ?Edythe Clarity, [facility] (Pharmacist) ? ?Hematological/Oncological History ?# Small Cell Lung Cancer, Extensive Stage ?07/05/2020: CT abdomen for lower abdominal pain. New pulmonary nodular density noted ?07/06/2020: CT chest showed 2.2 cm macrolobulated right lower lobe pulmonary nodule (favored) versus pathologically enlarged infrahilar lymph node ?10/13/2020: PET CT scan performed, findings show 2 cm right lower lobe lung mass is hypermetabolic and consistent with primary lung neoplasm. Additionally found hypermetabolic 17 mm soft tissue lesion between the descending duodenum and the pancreatic head  ?10/19/2020: EGD to biopsy hypermetabolic lymph node. Biopsy results show small cell lung cancer ?10/26/2020: establish care with Dr. Lorenso Courier  ?11/13/2020: Cycle 1 Day 1 of Carbo/Etop/Atezolizumab ?12/04/2020: Cycle 2 Day 1 of Carbo/Etop/Atezolizumab ?11/22-11/25/2022: admitted for E. Coli bacteremia/sepsis. Start of Cycle 3 delayed. ?01/02/2021: Cycle 3 Day 1 of Carbo/Etop/Atezolizumab ?01/23/2021: Cycle 4 Day 1 of Carbo/Etop/Atezolizumab ?02/22/2021: Cycle 5 Day 1 of Atezolizumab Maintenance. Delayed start due to patient's COVID infection.  ?03/14/2021: Cycle 6 Day 1 of Atezolizumab Maintenance ?04/05/2021: Cycle 7 Day 1 of Atezolizumab  Maintenance ?04/25/2021: Cycle 8 Day 1 of Atezolizumab Maintenance ?05/16/2021: Cycle 9 Day 1 of Atezolizumab Maintenance ? ?Interval History:  ?[name] 66 y.o. female with medical history significant for extensive stage small cell lung cancer who presents for a follow up visit. The patient's last visit was on 04/05/2021. In the interim since the last visit she has completed cycle 8 of chemotherapy. She presents today to start cycle 9 of chemotherapy. ? ?On exam today [name] reports that she continues to struggle with fatigue.  She reports that her energy is a 0 out of 10.  She notes that her hair is growing back though she does continue to struggle with nausea.  She reports that she tends to Zofran before and after meal and that she has began developing headaches.  She had a migraine that lasted 3 days in the interim since her last visit.  She does get quite queasy on occasion.  She notes that she did have a near syncopal episode recently as well.  Unfortunately she also did injure her right second toe.  Otherwise she is been tolerating treatment without difficulty.  She denies fevers, chills, night sweats, chest pain, neuropathy or peripheral edema.  She has no other complaints. A full 10 point ROS is listed below. ? ? ?MEDICAL HISTORY:  ?Past Medical History:  ?Diagnosis Date  ? Allergic rhinitis   ? Anemia   ? Anxiety   ? Chicken pox   ? Chronic back pain   ? COPD (chronic obstructive pulmonary disease) ([place])   ? Depression   ? DM (diabetes mellitus) ([place])   ? Essential hypertension   ? GERD (gastroesophageal reflux disease)   ? Headache   ? migraines  ? History of gastritis   ? EGD 2015  ? History of home oxygen therapy   ? 2 liters at hs last 6 months  ? Hyperlipidemia   ?  Hypothyroidism   ? Migraines   ? Osteoarthritis   ? oa  ? Scoliosis   ? ? ?SURGICAL HISTORY: ?Past Surgical History:  ?Procedure Laterality Date  ? APPENDECTOMY    ? 1985  ? BIOPSY  07/24/2017  ? Procedure: BIOPSY;  Surgeon: Milus Banister, MD;  Location: [facility];  Service: Endoscopy;;  ? CARDIAC CATHETERIZATION N/A 10/31/2015  ? Procedure: Left Heart Cath and Coronary Angiography;  Surgeon: Leonie Man, MD;  Location: [facility];  Service: Cardiovascular;  Laterality: N/A;  ? CARPAL TUNNEL RELEASE Left   ? CARPAL TUNNEL RELEASE Right   ? CHOLECYSTECTOMY  late 1980's  ? COLONOSCOPY WITH PROPOFOL N/A 07/24/2017  ? Procedure: COLONOSCOPY WITH PROPOFOL;  Surgeon: Milus Banister, MD;  Location: [facility];  Service: Endoscopy;  Laterality: N/A;  ? ESOPHAGOGASTRODUODENOSCOPY N/A 07/24/2017  ? Procedure: ESOPHAGOGASTRODUODENOSCOPY (EGD);  Surgeon: Milus Banister, MD;  Location: [facility];  Service: Endoscopy;  Laterality: N/A;  ? ESOPHAGOGASTRODUODENOSCOPY (EGD) WITH PROPOFOL N/A 10/19/2020  ? Procedure: ESOPHAGOGASTRODUODENOSCOPY (EGD) WITH PROPOFOL;  Surgeon: Milus Banister, MD;  Location: [facility];  Service: Endoscopy;  Laterality: N/A;  ? FINE NEEDLE ASPIRATION N/A 10/19/2020  ? Procedure: FINE NEEDLE ASPIRATION (FNA) LINEAR;  Surgeon: Milus Banister, MD;  Location: [facility];  Service: Endoscopy;  Laterality: N/A;  ? [place]  ? HIP CLOSED REDUCTION Right 01/08/2016  ? Procedure: CLOSED MANIPULATION HIP;  Surgeon: Susa Day, MD;  Location: [facility];  Service: Orthopedics;  Laterality: Right;  ? HIP CLOSED REDUCTION Right 01/19/2016  ? Procedure: ATTEMPTED CLOSED REDUCTION RIGHT HIP;  Surgeon: Wylene Simmer, MD;  Location: [facility];  Service: Orthopedics;  Laterality: Right;  ? HIP CLOSED REDUCTION Right 01/20/2016  ? Procedure: CLOSED REDUCTION RIGHT TOTAL HIP;  Surgeon: Paralee Cancel, MD;  [facility];  Service: Orthopedics;  Laterality: Right;  ? HIP CLOSED REDUCTION Right 02/17/2016  ? Procedure: CLOSED REDUCTION RIGHT TOTAL HIP;  Surgeon: Rod Can, MD;  Location: [facility];  Service: Orthopedics;  Laterality: Right;  ? HIP CLOSED REDUCTION Right 02/28/2016  ? Procedure: CLOSED REDUCTION  HIP;  Surgeon: Nicholes Stairs, MD;  [facility]: [facility];  Service: Orthopedics;  Laterality: Right;  ? IR IMAGING GUIDED PORT INSERTION  11/01/2020  ? POLYPECTOMY  07/24/2017  ? Procedure: POLYPECTOMY;  Surgeon: Milus Banister, MD;  Location: [facility];  Service: Endoscopy;;  ? TONSILLECTOMY    ? TOTAL ABDOMINAL HYSTERECTOMY    ? 1985, with 1 ovary removed and 2 nd ovary removed 2003  ? TOTAL HIP ARTHROPLASTY Right   ? Original surgery 2006 with revision 2010  ? TOTAL HIP REVISION Right 01/01/2016  ? Procedure: TOTAL HIP REVISION;  Surgeon: Paralee Cancel, MD;  Location: [facility];  Service: Orthopedics;  Laterality: Right;  ? TOTAL HIP REVISION Right 03/02/2016  ? Procedure: TOTAL HIP REVISION;  Surgeon: Paralee Cancel, MD;  Location[facility];  Service: Orthopedics;  Laterality: Right;  ? TOTAL HIP REVISION Right 09/02/2016  ? Procedure: Right hip constrained liner- posterior;  Surgeon: Paralee Cancel, MD;  Location: [facility];  Service: Orthopedics;  Laterality: Right;  ? ULNAR NERVE TRANSPOSITION Right   ? UPPER ESOPHAGEAL ENDOSCOPIC ULTRASOUND (EUS) N/A 10/19/2020  ? Procedure: UPPER ESOPHAGEAL ENDOSCOPIC ULTRASOUND (EUS);  Surgeon: Milus Banister, MD;  Location: [facility];  Service: Endoscopy;  Laterality: N/A;  periduodenal lesion  ? ? ?SOCIAL HISTORY: ?Social History  ? ?Socioeconomic History  ? Marital status: Married  ?  Spouse name: Not on file  ? Number of children: 2  ? Years of education: Not on file  ? Highest education level: Not on file  ?Occupational History  ? Occupation: disabled  ? Occupation: disabled  ?Tobacco Use  ? Smoking status: Every Day  ?  Packs/day: 2.00  ?  Years: 46.00  ?  Pack years: 92.00  ?  Types: Cigarettes  ? Smokeless tobacco: Never  ? Tobacco comments:  ?  2 packs of cigarettes smoked daily [facility] 04/10/21  ?Vaping Use  ? Vaping Use: Never used  ?Substance and Sexual Activity  ? Alcohol use: No  ? Drug use: No  ? Sexual activity: Not Currently  ?  Partners: Male  ?Other Topics Concern   ? Not on file  ?Social History Narrative  ? Right handed   ? Caffeine~ 2 cups per day   ? Lives at home with husband (strained relationship)  ? Primary caretaker for disabled brother who had aneurism  ? [place]

## 2021-05-16 NOTE — Patient Instructions (Signed)
[facility]   ?Discharge Instructions: ?Thank you for choosing [facility] to provide your oncology and hematology care.  ? ?If you have a lab appointment with the [facility], please go directly to the [facility] and check in at the registration area. ?  ?Wear comfortable clothing and clothing appropriate for easy access to any Portacath or PICC line.  ? ?We strive to give you quality time with your provider. You may need to reschedule your appointment if you arrive late (15 or more minutes).  Arriving late affects you and other patients whose appointments are after yours.  Also, if you miss three or more appointments without notifying the office, you may be dismissed from the clinic at the provider?s discretion.    ?  ?For prescription refill requests, have your pharmacy contact our office and allow 72 hours for refills to be completed.   ? ?Today you received the following chemotherapy and/or immunotherapy agents: atezolizumab    ?  ?To help prevent nausea and vomiting after your treatment, we encourage you to take your nausea medication as directed. ? ?BELOW ARE SYMPTOMS THAT SHOULD BE REPORTED IMMEDIATELY: ?*FEVER GREATER THAN 100.4 F (38 ?C) OR HIGHER ?*CHILLS OR SWEATING ?*NAUSEA AND VOMITING THAT IS NOT CONTROLLED WITH YOUR NAUSEA MEDICATION ?*UNUSUAL SHORTNESS OF BREATH ?*UNUSUAL BRUISING OR BLEEDING ?*URINARY PROBLEMS (pain or burning when urinating, or frequent urination) ?*BOWEL PROBLEMS (unusual diarrhea, constipation, pain near the anus) ?TENDERNESS IN MOUTH AND THROAT WITH OR WITHOUT PRESENCE OF ULCERS (sore throat, sores in mouth, or a toothache) ?UNUSUAL RASH, SWELLING OR PAIN  ?UNUSUAL VAGINAL DISCHARGE OR ITCHING  ? ?Items with * indicate a potential emergency and should be followed up as soon as possible or go to the Emergency Department if any problems should occur. ? ?Please show the CHEMOTHERAPY ALERT CARD or IMMUNOTHERAPY ALERT CARD at  check-in to the Emergency Department and triage nurse. ? ?Should you have questions after your visit or need to cancel or reschedule your appointment, please contact [facility]  Dept: [phone]  and follow the prompts.  Office hours are 8:00 a.m. to 4:30 p.m. Monday - Friday. Please note that voicemails left after 4:00 p.m. may not be returned until the following business day.  We are closed weekends and major holidays. You have access to a nurse at all times for urgent questions. Please call the main number to the clinic Dept: [phone] and follow the prompts. ? ? ?For any non-urgent questions, you may also contact your provider using MyChart. We now offer e-Visits for anyone 65 and older to request care online for non-urgent symptoms. For details visit [email or web address]. ?  ?Also download the MyChart app! Go to the app store, search "MyChart", open the app, select [facility], and log in with your MyChart username and password. ? ?Due to Covid, a mask is required upon entering the hospital/clinic. If you do not have a mask, one will be given to you upon arrival. For doctor visits, patients may have 1 support person aged 65 or older with them. For treatment visits, patients cannot have anyone with them due to current Covid guidelines and our immunocompromised population.  ? ?

## 2021-05-16 NOTE — Progress Notes (Signed)
Nutrition Follow-up: ? ?Patient with extensive stage small cell lung cancer. She is currently receiving maintenance chemotherapy with Atezolizumab.  ? ?Met with patient during infusion. She reports ongoing poor appetite. Patient recalls one meal a day and "lots of times that is too much." Patient has not tried oral supplement samples yet. She has chronic constipation and endorses persistent nausea. Patient has been taking zofran, but this is giving her headaches. She reports MD has recommended trying a different nausea medication at visit today. Patient reports not resting well at night. ? ? ?Medications: zyprexa ? ?Labs: glucose 100 ? ?Anthropometrics: Weight 195 lb 14.4 oz today decreased  ? ?3/29 - 198 lb 14.4 oz  ?3/17 - 196 lb 3.2 oz  ?3/9 - 198 lb 8 oz  ?2/15 - 200 lb 6.4 oz  ? ? ?NUTRITION DIAGNOSIS: Inadequate oral intake continues  ? ? ?INTERVENTION:  ?Encouraged small frequent meals and snacks vs one big meal day - ideas provided ?Encouraged patient to try samples of supplements, if tolerating recommend 2-3 day ?Reviewed tips for nausea, foods best tolerated and foods to avoid - pt has handout  ?Patient has contact information  ?  ? ?MONITORING, EVALUATION, GOAL: weight trends, intake  ? ? ?NEXT VISIT: Wednesday May 10 during infusion  ? ? ? ?

## 2021-05-17 ENCOUNTER — Ambulatory Visit: Payer: No Typology Code available for payment source | Admitting: Hematology and Oncology

## 2021-05-17 ENCOUNTER — Other Ambulatory Visit: Payer: No Typology Code available for payment source

## 2021-05-17 ENCOUNTER — Ambulatory Visit: Payer: No Typology Code available for payment source

## 2021-05-25 ENCOUNTER — Telehealth: Payer: Self-pay | Admitting: Physician Assistant

## 2021-05-27 ENCOUNTER — Other Ambulatory Visit: Payer: Self-pay | Admitting: Physical Medicine and Rehabilitation

## 2021-05-27 DIAGNOSIS — F17210 Nicotine dependence, cigarettes, uncomplicated: Secondary | ICD-10-CM | POA: Diagnosis not present

## 2021-05-27 DIAGNOSIS — C3490 Malignant neoplasm of unspecified part of unspecified bronchus or lung: Secondary | ICD-10-CM

## 2021-05-27 DIAGNOSIS — J449 Chronic obstructive pulmonary disease, unspecified: Secondary | ICD-10-CM

## 2021-05-27 DIAGNOSIS — G894 Chronic pain syndrome: Secondary | ICD-10-CM

## 2021-05-28 DIAGNOSIS — J449 Chronic obstructive pulmonary disease, unspecified: Secondary | ICD-10-CM | POA: Diagnosis not present

## 2021-05-28 MED ORDER — HYDROCODONE-ACETAMINOPHEN 10-325 MG PO TABS: 1.0000 | ORAL_TABLET | ORAL | 0 refills | Status: DC | PRN

## 2021-06-04 ENCOUNTER — Encounter: Payer: No Typology Code available for payment source | Admitting: Physical Medicine and Rehabilitation

## 2021-06-04 NOTE — Telephone Encounter (Signed)
.. ?  Encourage patient to contact the pharmacy for refills or they can request refills through [facility] ? ?LAST APPOINTMENT DATE:   ?04/13/21 ? ?NEXT APPOINTMENT DATE: ?N/a ? ?MEDICATION: ?ALPRAZolam (XANAX) 1 MG tablet [[id number]]  ? ? ?Is the patient out of medication?  ?Almost, "has one pill left" ? ? ?PHARMACY: ?[facility], [place]  ?[address] [place] [address]  ?Phone:  [phone]  Fax:  [phone] ? ? ?Let patient know to contact pharmacy at the end of the day to make sure medication is ready. ? ?Please notify patient to allow 48-72 hours to process  ?

## 2021-06-06 ENCOUNTER — Other Ambulatory Visit: Payer: Self-pay

## 2021-06-06 ENCOUNTER — Other Ambulatory Visit: Payer: Self-pay | Admitting: Nurse Practitioner

## 2021-06-06 ENCOUNTER — Other Ambulatory Visit: Payer: Self-pay | Admitting: Physician Assistant

## 2021-06-06 ENCOUNTER — Inpatient Hospital Stay: Payer: No Typology Code available for payment source | Attending: Physician Assistant

## 2021-06-06 ENCOUNTER — Inpatient Hospital Stay (HOSPITAL_BASED_OUTPATIENT_CLINIC_OR_DEPARTMENT_OTHER): Payer: No Typology Code available for payment source | Admitting: Physician Assistant

## 2021-06-06 ENCOUNTER — Inpatient Hospital Stay: Payer: No Typology Code available for payment source

## 2021-06-06 ENCOUNTER — Inpatient Hospital Stay: Payer: No Typology Code available for payment source | Admitting: Dietician

## 2021-06-06 ENCOUNTER — Other Ambulatory Visit: Payer: Self-pay | Admitting: Hematology and Oncology

## 2021-06-06 VITALS — BP 136/71 | HR 98 | Temp 97.6°F | Resp 17 | Ht 64.0 in | Wt 199.6 lb

## 2021-06-06 DIAGNOSIS — E785 Hyperlipidemia, unspecified: Secondary | ICD-10-CM | POA: Diagnosis not present

## 2021-06-06 DIAGNOSIS — C3490 Malignant neoplasm of unspecified part of unspecified bronchus or lung: Secondary | ICD-10-CM

## 2021-06-06 DIAGNOSIS — E876 Hypokalemia: Secondary | ICD-10-CM | POA: Diagnosis not present

## 2021-06-06 DIAGNOSIS — Z8261 Family history of arthritis: Secondary | ICD-10-CM | POA: Insufficient documentation

## 2021-06-06 DIAGNOSIS — C3431 Malignant neoplasm of lower lobe, right bronchus or lung: Secondary | ICD-10-CM | POA: Insufficient documentation

## 2021-06-06 DIAGNOSIS — Z820 Family history of epilepsy and other diseases of the nervous system: Secondary | ICD-10-CM | POA: Diagnosis not present

## 2021-06-06 DIAGNOSIS — R200 Anesthesia of skin: Secondary | ICD-10-CM | POA: Insufficient documentation

## 2021-06-06 DIAGNOSIS — Z814 Family history of other substance abuse and dependence: Secondary | ICD-10-CM | POA: Diagnosis not present

## 2021-06-06 DIAGNOSIS — E119 Type 2 diabetes mellitus without complications: Secondary | ICD-10-CM | POA: Insufficient documentation

## 2021-06-06 DIAGNOSIS — Z809 Family history of malignant neoplasm, unspecified: Secondary | ICD-10-CM | POA: Insufficient documentation

## 2021-06-06 DIAGNOSIS — Z836 Family history of other diseases of the respiratory system: Secondary | ICD-10-CM | POA: Diagnosis not present

## 2021-06-06 DIAGNOSIS — Z823 Family history of stroke: Secondary | ICD-10-CM | POA: Insufficient documentation

## 2021-06-06 DIAGNOSIS — R110 Nausea: Secondary | ICD-10-CM | POA: Diagnosis not present

## 2021-06-06 DIAGNOSIS — R059 Cough, unspecified: Secondary | ICD-10-CM | POA: Insufficient documentation

## 2021-06-06 DIAGNOSIS — G629 Polyneuropathy, unspecified: Secondary | ICD-10-CM | POA: Insufficient documentation

## 2021-06-06 DIAGNOSIS — Z596 Low income: Secondary | ICD-10-CM | POA: Insufficient documentation

## 2021-06-06 DIAGNOSIS — F17210 Nicotine dependence, cigarettes, uncomplicated: Secondary | ICD-10-CM | POA: Insufficient documentation

## 2021-06-06 DIAGNOSIS — R0600 Dyspnea, unspecified: Secondary | ICD-10-CM | POA: Diagnosis not present

## 2021-06-06 DIAGNOSIS — Z79899 Other long term (current) drug therapy: Secondary | ICD-10-CM | POA: Insufficient documentation

## 2021-06-06 DIAGNOSIS — Z8249 Family history of ischemic heart disease and other diseases of the circulatory system: Secondary | ICD-10-CM | POA: Diagnosis not present

## 2021-06-06 DIAGNOSIS — Z95828 Presence of other vascular implants and grafts: Secondary | ICD-10-CM

## 2021-06-06 LAB — CMP (CANCER CENTER ONLY)
ALT: 11 U/L (ref 0–44)
AST: 15 U/L (ref 15–41)
Albumin: 3.3 g/dL — ABNORMAL LOW (ref 3.5–5.0)
Alkaline Phosphatase: 77 U/L (ref 38–126)
Anion gap: 6 (ref 5–15)
BUN: 9 mg/dL (ref 8–23)
CO2: 28 mmol/L (ref 22–32)
Calcium: 8.3 mg/dL — ABNORMAL LOW (ref 8.9–10.3)
Chloride: 105 mmol/L (ref 98–111)
Creatinine: 0.81 mg/dL (ref 0.44–1.00)
GFR, Estimated: >60 mL/min (ref >60)
Glucose, Bld: 176 mg/dL — ABNORMAL HIGH (ref 70–99)
Potassium: 3.4 mmol/L — ABNORMAL LOW (ref 3.5–5.1)
Sodium: 139 mmol/L (ref 135–145)
Total Bilirubin: 0.3 mg/dL (ref 0.3–1.2)
Total Protein: 6.4 g/dL — ABNORMAL LOW (ref 6.5–8.1)

## 2021-06-06 LAB — CBC WITH DIFFERENTIAL (CANCER CENTER ONLY)
Abs Immature Granulocytes: 0.01 10*3/uL (ref 0.00–0.07)
Basophils Absolute: 0 10*3/uL (ref 0.0–0.1)
Basophils Relative: 1 %
Eosinophils Absolute: 0.3 10*3/uL (ref 0.0–0.5)
Eosinophils Relative: 5 %
HCT: 38.7 % (ref 36.0–46.0)
Hemoglobin: 11.9 g/dL — ABNORMAL LOW (ref 12.0–15.0)
Immature Granulocytes: 0 %
Lymphocytes Relative: 37 %
Lymphs Abs: 2.2 10*3/uL (ref 0.7–4.0)
MCH: 26.4 pg (ref 26.0–34.0)
MCHC: 30.7 g/dL (ref 30.0–36.0)
MCV: 85.8 fL (ref 80.0–100.0)
Monocytes Absolute: 0.4 10*3/uL (ref 0.1–1.0)
Monocytes Relative: 8 %
Neutro Abs: 3 10*3/uL (ref 1.7–7.7)
Neutrophils Relative %: 49 %
Platelet Count: 159 10*3/uL (ref 150–400)
RBC: 4.51 MIL/uL (ref 3.87–5.11)
RDW: 17.2 % — ABNORMAL HIGH (ref 11.5–15.5)
WBC Count: 5.9 10*3/uL (ref 4.0–10.5)
nRBC: 0 % (ref 0.0–0.2)

## 2021-06-06 LAB — TSH: TSH: 0.363 u[IU]/mL (ref 0.350–4.500)

## 2021-06-06 MED ORDER — SODIUM CHLORIDE 0.9 % IV SOLN: Freq: Once | INTRAVENOUS | Status: AC

## 2021-06-06 MED ORDER — SODIUM CHLORIDE 0.9% FLUSH
10.0000 mL | Freq: Once | INTRAVENOUS | Status: AC
  Administered 2021-06-06: 10 mL

## 2021-06-06 MED ORDER — ALPRAZOLAM 1 MG PO TABS: ORAL_TABLET | ORAL | 2 refills | Status: DC

## 2021-06-06 MED ORDER — POTASSIUM CHLORIDE CRYS ER 20 MEQ PO TBCR
EXTENDED_RELEASE_TABLET | ORAL | Status: AC
  Filled 2021-06-06: qty 1

## 2021-06-06 MED ORDER — HEPARIN SOD (PORK) LOCK FLUSH 100 UNIT/ML IV SOLN
500.0000 [IU] | Freq: Once | INTRAVENOUS | Status: AC | PRN
  Administered 2021-06-06: 500 [IU]

## 2021-06-06 MED ORDER — POTASSIUM CHLORIDE CRYS ER 20 MEQ PO TBCR
20.0000 meq | EXTENDED_RELEASE_TABLET | Freq: Once | ORAL | Status: AC
  Administered 2021-06-06: 20 meq via ORAL

## 2021-06-06 MED ORDER — SODIUM CHLORIDE 0.9% FLUSH
10.0000 mL | INTRAVENOUS | Status: DC | PRN
  Administered 2021-06-06: 10 mL

## 2021-06-06 MED ORDER — SODIUM CHLORIDE 0.9 % IV SOLN
1200.0000 mg | Freq: Once | INTRAVENOUS | Status: AC
  Administered 2021-06-06: 1200 mg via INTRAVENOUS
  Filled 2021-06-06: qty 20

## 2021-06-06 NOTE — Patient Instructions (Signed)
[facility]  Discharge Instructions: ?Thank you for choosing [facility] to provide your oncology and hematology care.  ? ?If you have a lab appointment with the [facility], please go directly to the [facility] and check in at the registration area. ?  ?Wear comfortable clothing and clothing appropriate for easy access to any Portacath or PICC line.  ? ?We strive to give you quality time with your provider. You may need to reschedule your appointment if you arrive late (15 or more minutes).  Arriving late affects you and other patients whose appointments are after yours.  Also, if you miss three or more appointments without notifying the office, you may be dismissed from the clinic at the provider?s discretion.    ?  ?For prescription refill requests, have your pharmacy contact our office and allow 72 hours for refills to be completed.   ? ?Today you received the following chemotherapy and/or immunotherapy agents: Atezolizumab     ?  ?To help prevent nausea and vomiting after your treatment, we encourage you to take your nausea medication as directed. ? ?BELOW ARE SYMPTOMS THAT SHOULD BE REPORTED IMMEDIATELY: ?*FEVER GREATER THAN 100.4 F (38 ?C) OR HIGHER ?*CHILLS OR SWEATING ?*NAUSEA AND VOMITING THAT IS NOT CONTROLLED WITH YOUR NAUSEA MEDICATION ?*UNUSUAL SHORTNESS OF BREATH ?*UNUSUAL BRUISING OR BLEEDING ?*URINARY PROBLEMS (pain or burning when urinating, or frequent urination) ?*BOWEL PROBLEMS (unusual diarrhea, constipation, pain near the anus) ?TENDERNESS IN MOUTH AND THROAT WITH OR WITHOUT PRESENCE OF ULCERS (sore throat, sores in mouth, or a toothache) ?UNUSUAL RASH, SWELLING OR PAIN  ?UNUSUAL VAGINAL DISCHARGE OR ITCHING  ? ?Items with * indicate a potential emergency and should be followed up as soon as possible or go to the Emergency Department if any problems should occur. ? ?Please show the CHEMOTHERAPY ALERT CARD or IMMUNOTHERAPY ALERT CARD at check-in  to the Emergency Department and triage nurse. ? ?Should you have questions after your visit or need to cancel or reschedule your appointment, please contact [facility]  Dept: [phone]  and follow the prompts.  Office hours are 8:00 a.m. to 4:30 p.m. Monday - Friday. Please note that voicemails left after 4:00 p.m. may not be returned until the following business day.  We are closed weekends and major holidays. You have access to a nurse at all times for urgent questions. Please call the main number to the clinic Dept: [phone] and follow the prompts. ? ? ?For any non-urgent questions, you may also contact your provider using MyChart. We now offer e-Visits for anyone 20 and older to request care online for non-urgent symptoms. For details visit [email or web address]. ?  ?Also download the MyChart app! Go to the app store, search "MyChart", open the app, select [facility], and log in with your MyChart username and password. ? ?Due to Covid, a mask is required upon entering the hospital/clinic. If you do not have a mask, one will be given to you upon arrival. For doctor visits, patients may have 1 support person aged 73 or older with them. For treatment visits, patients cannot have anyone with them due to current Covid guidelines and our immunocompromised population.  ? ?

## 2021-06-06 NOTE — Telephone Encounter (Unsigned)
Pharmacy states pt has one pill left. ?Pharmacy states pt has oncology care tomorrow, thinks it is unlikely she will be able to get refill after administration. ?Pharmacy requests another provider to refill for short term if PCP is not available. ? ?Please call pt today (05/10) for an update. ?

## 2021-06-06 NOTE — Progress Notes (Signed)
?[place] ?[phone]   [phone] ? ?PROGRESS NOTE ? ?Patient Care Team: ?Allwardt, Randa Evens, PA-C as PCP - General (Physician Assistant) ?Milus Banister, MD as Attending Physician (Gastroenterology) ?Dr. Retta Mac ?Paralee Cancel, MD as Consulting Physician (Orthopedic Surgery) ?Latanya Maudlin, MD as Consulting Physician (Orthopedic Surgery) ?Kathrynn Ducking, MD (Inactive) as Consulting Physician (Neurology) ?Okey Regal, OD as Consulting Physician (Optometry) ?Cameron Sprang, MD as Consulting Physician (Neurology) ?Valrie Hart, RN as Oncology Nurse Navigator (Oncology) ?Leona Singleton, RN as [employer] ?Edythe Clarity, [facility] (Pharmacist) ? ?Hematological/Oncological History ?# Small Cell Lung Cancer, Extensive Stage ?07/05/2020: CT abdomen for lower abdominal pain. New pulmonary nodular density noted ?07/06/2020: CT chest showed 2.2 cm macrolobulated right lower lobe pulmonary nodule (favored) versus pathologically enlarged infrahilar lymph node ?10/13/2020: PET CT scan performed, findings show 2 cm right lower lobe lung mass is hypermetabolic and consistent with primary lung neoplasm. Additionally found hypermetabolic 17 mm soft tissue lesion between the descending duodenum and the pancreatic head  ?10/19/2020: EGD to biopsy hypermetabolic lymph node. Biopsy results show small cell lung cancer ?10/26/2020: establish care with Dr. Lorenso Courier  ?11/13/2020: Cycle 1 Day 1 of Carbo/Etop/Atezolizumab ?12/04/2020: Cycle 2 Day 1 of Carbo/Etop/Atezolizumab ?11/22-11/25/2022: admitted for E. Coli bacteremia/sepsis. Start of Cycle 3 delayed. ?01/02/2021: Cycle 3 Day 1 of Carbo/Etop/Atezolizumab ?01/23/2021: Cycle 4 Day 1 of Carbo/Etop/Atezolizumab ?02/22/2021: Cycle 5 Day 1 of Atezolizumab Maintenance. Delayed start due to patient's COVID infection.  ?03/14/2021: Cycle 6 Day 1 of Atezolizumab Maintenance ?04/05/2021: Cycle 7 Day 1 of Atezolizumab  Maintenance ?04/25/2021: Cycle 8 Day 1 of Atezolizumab Maintenance ?05/16/2021: Cycle 9 Day 1 of Atezolizumab Maintenance ?06/06/2021: Cycle 10 Day 1 of Atezolizumab Maintenance ? ?Interval History:  ?[name] 66 y.o. female with medical history significant for extensive stage small cell lung cancer who presents for a follow up visit. The patient's last visit was on 05/16/2021. In the interim since the last visit she has completed cycle 9 of chemotherapy. She presents today to start cycle 10 of chemotherapy. ? ?On exam today [name] reports her energy levels are stable to slightly improved.  She reports that she was able to do more routines around the house.  Her appetite is unchanged and she denies any weight loss.  Her nausea improved and she adds that she only took a few doses of Zofran as needed.  She denies any abdominal pain and bowel habits are back to normal.  She denies easy bruising or signs of active bleeding.  Patient reports persistent shortness of breath.  She continues to have a cough with some hoarseness for the last several weeks.  She is currently taking Claritin as she does have some seasonal allergies.  She reports ongoing numbness and tingling in her hands and feet that does interfere with her grip and balance.  She is currently taking Cymbalta for neuropathic pain.She denies fevers, chills, night sweats, chest pain or peripheral edema.  She has no other complaints. A full 10 point ROS is listed below. ? ? ?MEDICAL HISTORY:  ?Past Medical History:  ?Diagnosis Date  ? Allergic rhinitis   ? Anemia   ? Anxiety   ? Chicken pox   ? Chronic back pain   ? COPD (chronic obstructive pulmonary disease) ([place])   ? Depression   ? DM (diabetes mellitus) ([place])   ? Essential hypertension   ? GERD (gastroesophageal reflux disease)   ? Headache   ? migraines  ?  History of gastritis   ? EGD 2015  ? History of home oxygen therapy   ? 2 liters at hs last 6 months  ? Hyperlipidemia   ? Hypothyroidism   ?  Migraines   ? Osteoarthritis   ? oa  ? Scoliosis   ? ? ?SURGICAL HISTORY: ?Past Surgical History:  ?Procedure Laterality Date  ? APPENDECTOMY    ? 1985  ? BIOPSY  07/24/2017  ? Procedure: BIOPSY;  Surgeon: Milus Banister, MD;  Location: [facility];  Service: Endoscopy;;  ? CARDIAC CATHETERIZATION N/A 10/31/2015  ? Procedure: Left Heart Cath and Coronary Angiography;  Surgeon: Leonie Man, MD;  Location: [facility];  Service: Cardiovascular;  Laterality: N/A;  ? CARPAL TUNNEL RELEASE Left   ? CARPAL TUNNEL RELEASE Right   ? CHOLECYSTECTOMY  late 1980's  ? COLONOSCOPY WITH PROPOFOL N/A 07/24/2017  ? Procedure: COLONOSCOPY WITH PROPOFOL;  Surgeon: Milus Banister, MD;  Location: [facility];  Service: Endoscopy;  Laterality: N/A;  ? ESOPHAGOGASTRODUODENOSCOPY N/A 07/24/2017  ? Procedure: ESOPHAGOGASTRODUODENOSCOPY (EGD);  Surgeon: Milus Banister, MD;  Location: [facility];  Service: Endoscopy;  Laterality: N/A;  ? ESOPHAGOGASTRODUODENOSCOPY (EGD) WITH PROPOFOL N/A 10/19/2020  ? Procedure: ESOPHAGOGASTRODUODENOSCOPY (EGD) WITH PROPOFOL;  Surgeon: Milus Banister, MD;  [facility]: [facility];  Service: Endoscopy;  Laterality: N/A;  ? FINE NEEDLE ASPIRATION N/A 10/19/2020  ? Procedure: FINE NEEDLE ASPIRATION (FNA) LINEAR;  Surgeon: Milus Banister, MD;  [facility]: [facility];  Service: Endoscopy;  Laterality: N/A;  ? [place]  ? HIP CLOSED REDUCTION Right 01/08/2016  ? Procedure: CLOSED MANIPULATION HIP;  Surgeon: Susa Day, MD;  [facility]: [facility];  Service: Orthopedics;  Laterality: Right;  ? HIP CLOSED REDUCTION Right 01/19/2016  ? Procedure: ATTEMPTED CLOSED REDUCTION RIGHT HIP;  Surgeon: Wylene Simmer, MD;  [facility];  Service: Orthopedics;  Laterality: Right;  ? HIP CLOSED REDUCTION Right 01/20/2016  ? Procedure: CLOSED REDUCTION RIGHT TOTAL HIP;  Surgeon: Paralee Cancel, MD;  [facility];  Service: Orthopedics;  Laterality: Right;  ? HIP CLOSED REDUCTION Right 02/17/2016   ? Procedure: CLOSED REDUCTION RIGHT TOTAL HIP;  Surgeon: Rod Can, MD;  Location: [place];  Service: Orthopedics;  Laterality: Right;  ? HIP CLOSED REDUCTION Right 02/28/2016  ? Procedure: CLOSED REDUCTION HIP;  Surgeon: Nicholes Stairs, MD;  [facility]: [facility];  Service: Orthopedics;  Laterality: Right;  ? IR IMAGING GUIDED PORT INSERTION  11/01/2020  ? POLYPECTOMY  07/24/2017  ? Procedure: POLYPECTOMY;  Surgeon: Milus Banister, MD;  Location: [facility];  Service: Endoscopy;;  ? TONSILLECTOMY    ? TOTAL ABDOMINAL HYSTERECTOMY    ? 1985, with 1 ovary removed and 2 nd ovary removed 2003  ? TOTAL HIP ARTHROPLASTY Right   ? Original surgery 2006 with revision 2010  ? TOTAL HIP REVISION Right 01/01/2016  ? Procedure: TOTAL HIP REVISION;  Surgeon: Paralee Cancel, MD;  [facility]: [facility];  Service: Orthopedics;  Laterality: Right;  ? TOTAL HIP REVISION Right 03/02/2016  ? Procedure: TOTAL HIP REVISION;  Surgeon: Paralee Cancel, MD;  [facility]: [facility];  Service: Orthopedics;  Laterality: Right;  ? TOTAL HIP REVISION Right 09/02/2016  ? Procedure: Right hip constrained liner- posterior;  Surgeon: Paralee Cancel, MD;  [facility]: [facility];  Service: Orthopedics;  Laterality: Right;  ? ULNAR NERVE TRANSPOSITION Right   ? UPPER ESOPHAGEAL ENDOSCOPIC ULTRASOUND (EUS) N/A 10/19/2020  ? Procedure: UPPER ESOPHAGEAL ENDOSCOPIC ULTRASOUND (EUS);  Surgeon: Owens Loffler  P, MD;  [facility]: [facility];  Service: Endoscopy;  Laterality: N/A;  periduodenal lesion  ? ? ?SOCIAL HISTORY: ?Social History  ? ?Socioeconomic History  ? Marital status: Married  ?  Spouse name: Not on file  ? Number of children: 2  ? Years of education: Not on file  ? Highest education level: Not on file  ?Occupational History  ? Occupation: disabled  ? Occupation: disabled  ?Tobacco Use  ? Smoking status: Every Day  ?  Packs/day: 2.00  ?  Years: 46.00  ?  Pack years: 92.00  ?  Types: Cigarettes  ? Smokeless tobacco: Never  ? Tobacco comments:  ?  2 packs of  cigarettes smoked daily [facility] 04/10/21  ?Vaping Use  ? Vaping Use: Never used  ?Substance and Sexual Activity  ? Alcohol use: No  ? Drug use: No  ? Sexual activity: Not Currently  ?  Partners: Male  ?Other Topics Concern

## 2021-06-06 NOTE — Progress Notes (Signed)
Nutrition Follow-up: ? ?Patient with extensive stage small cell lung cancer. She is currently receiving maintenance chemotherapy with Atezolizumab.  ?  ?Met with patient in infusion. She is doing well today. Her hair is beginning to grow out. It is growing in curly and she is happy about that. Patient reports appetite has been good. She is eating better. Patient tried oral supplements. These are okay. She will drink them if needed. Patient denies nausea, vomiting, diarrhea, constipation.  ? ? ?Medications: reviewed  ? ?Labs: K 3.4, glucose 176 ? ?Anthropometrics: Weight 199 lb 9.6 oz today increased  ? ?4/19 - 195 lb 14.4 oz  ?3/29 - 198 lb 14.4 oz  ?3/17 - 196 lb 3.2 oz  ? ? ?NUTRITION DIAGNOSIS: Inadequate oral intake improving ? ? ?INTERVENTION:  ?Continue strategies for increasing calories and protein with small frequent meals/snacks ?Encouraged Ensure Plus/equivalent daily as needed  ? ?  ? ?MONITORING, EVALUATION, GOAL: weight trends, intake  ? ? ?NEXT VISIT: Wednesday June 21 ? ? ? ?

## 2021-06-06 NOTE — Telephone Encounter (Signed)
Thank you, Dr. Jonni Sanger, for taking care of patient while I was out.  ?

## 2021-06-13 ENCOUNTER — Telehealth: Payer: Self-pay

## 2021-06-13 NOTE — Telephone Encounter (Signed)
Received a VM from pt representative from patient's new insurance, [employer]. Reps name is Herb Grays and her phone number is [phone]. She states that patient's chemo needs authorization. I called patient and she reports that she was told that she has a bill due to no prior auth on a tx.  ? ?This message will be routed to authorization specialist. ?

## 2021-06-14 ENCOUNTER — Telehealth (HOSPITAL_COMMUNITY): Payer: Self-pay | Admitting: Hematology and Oncology

## 2021-06-14 NOTE — Telephone Encounter (Signed)
Returned call to [facility] regarding chemo authorization status. Left vm for a return call.   [phone]

## 2021-06-20 DIAGNOSIS — R69 Illness, unspecified: Secondary | ICD-10-CM | POA: Diagnosis not present

## 2021-06-22 ENCOUNTER — Encounter
Payer: No Typology Code available for payment source | Attending: Physical Medicine and Rehabilitation | Admitting: Physical Medicine and Rehabilitation

## 2021-06-22 ENCOUNTER — Encounter: Payer: Self-pay | Admitting: Physical Medicine and Rehabilitation

## 2021-06-22 VITALS — BP 124/78 | HR 89 | Ht 64.0 in | Wt 200.0 lb

## 2021-06-22 DIAGNOSIS — Z5181 Encounter for therapeutic drug level monitoring: Secondary | ICD-10-CM | POA: Insufficient documentation

## 2021-06-22 DIAGNOSIS — G894 Chronic pain syndrome: Secondary | ICD-10-CM | POA: Diagnosis not present

## 2021-06-22 DIAGNOSIS — Z79891 Long term (current) use of opiate analgesic: Secondary | ICD-10-CM | POA: Insufficient documentation

## 2021-06-22 MED ORDER — LEVOTHYROXINE SODIUM 137 MCG PO TABS: 137.0000 ug | ORAL_TABLET | Freq: Every day | ORAL | 5 refills | Status: DC

## 2021-06-22 MED ORDER — METHADONE HCL 5 MG PO TABS: 2.5000 mg | ORAL_TABLET | Freq: Two times a day (BID) | ORAL | 0 refills | Status: DC

## 2021-06-22 MED ORDER — HYDROCODONE-ACETAMINOPHEN 10-325 MG PO TABS: 1.0000 | ORAL_TABLET | ORAL | 0 refills | Status: DC | PRN

## 2021-06-22 NOTE — Progress Notes (Signed)
Subjective:    Patient ID: [name], female    DOB: 1955/04/25, 66 y.o.   MRN: [id number]  HPI Pt is a 66 yr old female with hx of newly dx'd COPD, still smoking, HTN, DM- diet controlled; anxiety and depression;  Scoliosis and deg back issues,  Generalized OA;  chronic pain- here for f/u of chronic pain issues. With new 2.2 cm R lower pulmonary nodule and hemorrhoids.  Now has dx of Small cell lung cancer extensive stage per oncology note  Broke R 2nd toe- 3 weeks ago-  Hurts really bad.   Having to walk with RW most times.  It's a god send- but doesn't use all the time.   No falls since 3 weeks ago-  thinks she's passed out- only thing she remembers was clearing off coffee table with arm.   Also fell 6 weeks ago when foot was asleep- didn't get hurt that time.   BP 124/78- today.   Has had to lower fluid intake due ot excessive peeing.   Due to have chest CT in June 2023- to see how things going.   Still smoking- no drinking.   Pain- refilling 5x/day- more days; just not enough.   Also has neuropathy- burning pain.  Also has stomach pain- lower abdomen- feels like contrast with CT scan pain. Feels like on fire.  And also have upper abd pain-   Pain Inventory Average Pain 7 Pain Right Now 7 My pain is constant, sharp, burning, dull, stabbing, tingling, and aching  In the last 24 hours, has pain interfered with the following? General activity 8 Relation with others 10 Enjoyment of life 10 What TIME of day is your pain at its worst? morning  Sleep (in general) Poor  Pain is worse with: walking, bending, sitting, inactivity, standing, and some activites Pain improves with: medication and injections Relief from Meds: 7     Family History  Problem Relation Age of Onset   COPD Mother    Heart disease Mother    Lung disease Father        Asbestosis   Heart attack Father    Heart disease Father    Cerebral aneurysm Brother    Aneurysm Brother        Brain    Drug abuse Daughter    Epilepsy Son    Alcohol abuse Son    Drug abuse Son    Arthritis Maternal Grandmother    Heart disease Maternal Grandmother    Asthma Maternal Grandfather    Cancer Maternal Grandfather    Arthritis Paternal Grandmother    Heart disease Paternal Grandmother    Stroke Paternal Grandmother    Early death Paternal Grandfather    Heart disease Paternal Grandfather    Social History   Socioeconomic History   Marital status: Married    Spouse name: Not on file   Number of children: 2   Years of education: Not on file   Highest education level: Not on file  Occupational History   Occupation: disabled   Occupation: disabled  Tobacco Use   Smoking status: Every Day    Packs/day: 2.00    Years: 46.00    Pack years: 92.00    Types: Cigarettes   Smokeless tobacco: Never   Tobacco comments:    2 packs of cigarettes smoked daily ARJ 04/10/21  Vaping Use   Vaping Use: Never used  Substance and Sexual Activity   Alcohol use: No   Drug use:  No   Sexual activity: Not Currently    Partners: Male  Other Topics Concern   Not on file  Social History Narrative   Right handed    Caffeine~ 2 cups per day    Lives at home with husband (strained relationship)   Primary caretaker for disabled brother who had aneurism   Daughter died Jun 13, 2018    Social Determinants of Health   Financial Resource Strain: Medium Risk   Difficulty of Paying Living Expenses: Somewhat hard  Food Insecurity: No Food Insecurity   Worried About Charity fundraiser in the Last Year: Never true   Arboriculturist in the Last Year: Never true  Transportation Needs: Not on file  Physical Activity: Inactive   Days of Exercise per Week: 0 days   Minutes of Exercise per Session: 0 min  Stress: Stress Concern Present   Feeling of Stress : To some extent  Social Connections: Moderately Integrated   Frequency of Communication with Friends and Family: More than three times a week   Frequency  of Social Gatherings with Friends and Family: Once a week   Attends Religious Services: 1 to 4 times per year   Active Member of [employer] or Organizations: No   Attends Archivist Meetings: Never   Marital Status: Married   Past Surgical History:  Procedure Laterality Date   APPENDECTOMY     1985   BIOPSY  07/24/2017   Procedure: BIOPSY;  Surgeon: Milus Banister, MD;  Location: [facility];  Service: Endoscopy;;   CARDIAC CATHETERIZATION N/A 10/31/2015   Procedure: Left Heart Cath and Coronary Angiography;  Surgeon: Leonie Man, MD;  Location: [facility];  Service: Cardiovascular;  Laterality: N/A;   CARPAL TUNNEL RELEASE Left    CARPAL TUNNEL RELEASE Right    CHOLECYSTECTOMY  late 1980's   COLONOSCOPY WITH PROPOFOL N/A 07/24/2017   Procedure: COLONOSCOPY WITH PROPOFOL;  Surgeon: Milus Banister, MD;  [facility]: [facility];  Service: Endoscopy;  Laterality: N/A;   ESOPHAGOGASTRODUODENOSCOPY N/A 07/24/2017   Procedure: ESOPHAGOGASTRODUODENOSCOPY (EGD);  Surgeon: Milus Banister, MD;  Location: [facility];  Service: Endoscopy;  Laterality: N/A;   ESOPHAGOGASTRODUODENOSCOPY (EGD) WITH PROPOFOL N/A 10/19/2020   Procedure: ESOPHAGOGASTRODUODENOSCOPY (EGD) WITH PROPOFOL;  Surgeon: Milus Banister, MD;  [facility];  Service: Endoscopy;  Laterality: N/A;   FINE NEEDLE ASPIRATION N/A 10/19/2020   Procedure: FINE NEEDLE ASPIRATION (FNA) LINEAR;  Surgeon: Milus Banister, MD;  [facility];  Service: Endoscopy;  Laterality: N/A;   GALLBLADDER SURGERY  1991   HIP CLOSED REDUCTION Right 01/08/2016   Procedure: CLOSED MANIPULATION HIP;  Surgeon: Susa Day, MD;  [facility];  Service: Orthopedics;  Laterality: Right;   HIP CLOSED REDUCTION Right 01/19/2016   Procedure: ATTEMPTED CLOSED REDUCTION RIGHT HIP;  Surgeon: Wylene Simmer, MD;  [facility];  Service: Orthopedics;  Laterality: Right;   HIP CLOSED REDUCTION Right 01/20/2016   Procedure:  CLOSED REDUCTION RIGHT TOTAL HIP;  Surgeon: Paralee Cancel, MD;  [facility];  Service: Orthopedics;  Laterality: Right;   HIP CLOSED REDUCTION Right 02/17/2016   Procedure: CLOSED REDUCTION RIGHT TOTAL HIP;  Surgeon: Rod Can, MD;  Location: [place];  Service: Orthopedics;  Laterality: Right;   HIP CLOSED REDUCTION Right 02/28/2016   Procedure: CLOSED REDUCTION HIP;  Surgeon: Nicholes Stairs, MD;  [facility];  Service: Orthopedics;  Laterality: Right;   IR IMAGING GUIDED PORT INSERTION  11/01/2020  POLYPECTOMY  07/24/2017   Procedure: POLYPECTOMY;  Surgeon: Milus Banister, MD;  [facility];  Service: Endoscopy;;   [place], with 1 ovary removed and 2 nd ovary removed 2003   [place] Right    Original surgery 2006 with revision 2010   TOTAL HIP REVISION Right 01/01/2016   Procedure: TOTAL HIP REVISION;  Surgeon: Paralee Cancel, MD;  [facility];  Service: Orthopedics;  Laterality: Right;   TOTAL HIP REVISION Right 03/02/2016   Procedure: TOTAL HIP REVISION;  Surgeon: Paralee Cancel, MD;  [facility];  Service: Orthopedics;  Laterality: Right;   TOTAL HIP REVISION Right 09/02/2016   Procedure: Right hip constrained liner- posterior;  Surgeon: Paralee Cancel, MD;  [facility];  Service: Orthopedics;  Laterality: Right;   ULNAR NERVE TRANSPOSITION Right    UPPER ESOPHAGEAL ENDOSCOPIC ULTRASOUND (EUS) N/A 10/19/2020   Procedure: UPPER ESOPHAGEAL ENDOSCOPIC ULTRASOUND (EUS);  Surgeon: Milus Banister, MD;  Location: [facility];  Service: Endoscopy;  Laterality: N/A;  periduodenal lesion   Past Medical History:  Diagnosis Date   Allergic rhinitis    Anemia    Anxiety    Chicken pox    Chronic back pain    COPD (chronic obstructive pulmonary disease) ([facility])    Depression    DM (diabetes mellitus) ([place])    Essential hypertension    GERD (gastroesophageal reflux disease)    Headache    migraines    History of gastritis    EGD 2015   History of home oxygen therapy    2 liters at hs last 6 months   Hyperlipidemia    Hypothyroidism    Migraines    Osteoarthritis    oa   Scoliosis    Ht 5\' 4"  (1.626 m)   BMI 34.26 kg/m   Opioid Risk Score:   Fall Risk Score:  `1  Depression screen PHQ 2/9     05/03/2021    1:35 PM 03/05/2021    2:26 PM 03/05/2021    2:25 PM 04/10/2020    9:18 AM 12/27/2019    1:42 PM 11/30/2019    3:42 PM 08/11/2019    3:07 PM  Depression screen PHQ 2/9  Decreased Interest 0 0 0 2 0 2 3  Down, Depressed, Hopeless 1 0 0 1 2 1 3   PHQ - 2 Score 1 0 0 3 2 3 6   Altered sleeping 0   0 1 0 3  Tired, decreased energy 2   2 1 2 3   Change in appetite 0   0 1 0 3  Feeling bad or failure about yourself  0   1 1 0 3  Trouble concentrating 1   1 0 0 3  Moving slowly or fidgety/restless 0   0 0 0 3  Suicidal thoughts 0   0 0 0   PHQ-9 Score 4   7 6 5 24   Difficult doing work/chores Somewhat difficult    Somewhat difficult Somewhat difficult     Review of Systems  Musculoskeletal:  Positive for back pain.       Left knee pain, pain in both hips, right elbow, right second toe  Neurological:  Positive for headaches.  All other systems reviewed and are negative.     Objective:   Physical Exam  Awake, alert, appropriate, hair real short; NAD Tearful at times TTP across low back and lower abdomen Looks  more frail-  Not using rollator today       Assessment & Plan:   Pt is a 66 yr old female with hx of newly dx'd COPD, still smoking, HTN, DM- diet controlled; anxiety and depression;  Scoliosis and deg back issues,  Generalized OA;  chronic pain- here for f/u of chronic pain issues. With new 2.2 cm R lower pulmonary nodule and hemorrhoids.  Now has dx of Small cell lung cancer extensive stage per oncology note.   Methadone- 2.5 mg 2x/day-  will help nerve pain- and burning abdominal pain- as well as chronic back pain that hasn't responded well to Norco- but  dose not enough- and Oxycodone made her have hallucinations. Think this is the only medicine that will work to treat all types of her pain- unless try Nucynta, which is extremely expensive and she cannot afford.     2.  Con't Norco 10/325 mg q4 hours- max 5x/day- for break through pain- but hopefully pain better controlled  3. Wants to increase thyroid medicine, because her pain is better controlled- and less energy with lesser dose- since TSH is -.36- slightly low, on upper dose of 150 mcg, think 137 mcg works with labs- will send in for pt.   4. Methadone gets stronger for the first week, so it can get better over the next week- call me in 2 weeks ot let me know hwo things are going.   5. F?U in 3 months-    I spent a total of  40  minutes on total care today- >50% coordination of care- due to d/w pt about options for pain meds- decided on methadone since helps nerve pain and regular pain.

## 2021-06-22 NOTE — Patient Instructions (Signed)
Pt is a 66 yr old female with hx of newly dx'd COPD, still smoking, HTN, DM- diet controlled; anxiety and depression;  Scoliosis and deg back issues,  Generalized OA;  chronic pain- here for f/u of chronic pain issues. With new 2.2 cm R lower pulmonary nodule and hemorrhoids.  Now has dx of Small cell lung cancer extensive stage per oncology note.   Methadone- 2.5 mg 2x/day-  will help nerve pain- and burning abdominal pain- as well as chronic back pain that hasn't responded well to Norco- but dose not enough- and Oxycodone made her have hallucinations. Think this is the only medicine that will work to treat all types of her pain- unless try Nucynta, which is extremely expensive and she cannot afford.     2.  Con't Norco 10/325 mg q4 hours- max 5x/day- for break through pain- but hopefully pain better controlled  3. Wants to increase thyroid medicine, because her pain is better controlled- and less energy with lesser dose- since TSH is -.36- slightly low, on upper dose of 150 mcg, think 137 mcg works with labs- will send in for pt.   4. Methadone gets stronger for the first week, so it can get better over the next week- call me in 2 weeks ot let me know hwo things are going.   5. F?U in 3 months-

## 2021-06-25 ENCOUNTER — Other Ambulatory Visit: Payer: Self-pay | Admitting: Hematology and Oncology

## 2021-06-27 ENCOUNTER — Inpatient Hospital Stay: Payer: No Typology Code available for payment source

## 2021-06-27 ENCOUNTER — Inpatient Hospital Stay (HOSPITAL_BASED_OUTPATIENT_CLINIC_OR_DEPARTMENT_OTHER): Payer: No Typology Code available for payment source | Admitting: Physician Assistant

## 2021-06-27 ENCOUNTER — Other Ambulatory Visit: Payer: Self-pay

## 2021-06-27 VITALS — BP 143/71 | HR 88 | Temp 98.1°F | Wt 203.0 lb

## 2021-06-27 DIAGNOSIS — C3490 Malignant neoplasm of unspecified part of unspecified bronchus or lung: Secondary | ICD-10-CM

## 2021-06-27 DIAGNOSIS — Z95828 Presence of other vascular implants and grafts: Secondary | ICD-10-CM

## 2021-06-27 DIAGNOSIS — C3431 Malignant neoplasm of lower lobe, right bronchus or lung: Secondary | ICD-10-CM | POA: Diagnosis not present

## 2021-06-27 LAB — CBC WITH DIFFERENTIAL (CANCER CENTER ONLY)
Abs Immature Granulocytes: 0.03 10*3/uL (ref 0.00–0.07)
Basophils Absolute: 0.1 10*3/uL (ref 0.0–0.1)
Basophils Relative: 1 %
Eosinophils Absolute: 0.5 10*3/uL (ref 0.0–0.5)
Eosinophils Relative: 6 %
HCT: 36.8 % (ref 36.0–46.0)
Hemoglobin: 11.9 g/dL — ABNORMAL LOW (ref 12.0–15.0)
Immature Granulocytes: 0 %
Lymphocytes Relative: 36 %
Lymphs Abs: 3.2 10*3/uL (ref 0.7–4.0)
MCH: 26.7 pg (ref 26.0–34.0)
MCHC: 32.3 g/dL (ref 30.0–36.0)
MCV: 82.7 fL (ref 80.0–100.0)
Monocytes Absolute: 0.7 10*3/uL (ref 0.1–1.0)
Monocytes Relative: 8 %
Neutro Abs: 4.3 10*3/uL (ref 1.7–7.7)
Neutrophils Relative %: 49 %
Platelet Count: 181 10*3/uL (ref 150–400)
RBC: 4.45 MIL/uL (ref 3.87–5.11)
RDW: 18.3 % — ABNORMAL HIGH (ref 11.5–15.5)
WBC Count: 8.8 10*3/uL (ref 4.0–10.5)
nRBC: 0 % (ref 0.0–0.2)

## 2021-06-27 LAB — CMP (CANCER CENTER ONLY)
ALT: 11 U/L (ref 0–44)
AST: 15 U/L (ref 15–41)
Albumin: 3.4 g/dL — ABNORMAL LOW (ref 3.5–5.0)
Alkaline Phosphatase: 87 U/L (ref 38–126)
Anion gap: 4 — ABNORMAL LOW (ref 5–15)
BUN: 10 mg/dL (ref 8–23)
CO2: 29 mmol/L (ref 22–32)
Calcium: 8.7 mg/dL — ABNORMAL LOW (ref 8.9–10.3)
Chloride: 103 mmol/L (ref 98–111)
Creatinine: 0.93 mg/dL (ref 0.44–1.00)
GFR, Estimated: >60 mL/min (ref >60)
Glucose, Bld: 123 mg/dL — ABNORMAL HIGH (ref 70–99)
Potassium: 3.6 mmol/L (ref 3.5–5.1)
Sodium: 136 mmol/L (ref 135–145)
Total Bilirubin: 0.4 mg/dL (ref 0.3–1.2)
Total Protein: 6.1 g/dL — ABNORMAL LOW (ref 6.5–8.1)

## 2021-06-27 LAB — TSH: TSH: 3.379 u[IU]/mL (ref 0.350–4.500)

## 2021-06-27 MED ORDER — HEPARIN SOD (PORK) LOCK FLUSH 100 UNIT/ML IV SOLN
500.0000 [IU] | Freq: Once | INTRAVENOUS | Status: AC | PRN
  Administered 2021-06-27: 500 [IU]

## 2021-06-27 MED ORDER — SODIUM CHLORIDE 0.9 % IV SOLN: Freq: Once | INTRAVENOUS | Status: AC

## 2021-06-27 MED ORDER — SODIUM CHLORIDE 0.9% FLUSH
10.0000 mL | INTRAVENOUS | Status: DC | PRN
  Administered 2021-06-27: 10 mL

## 2021-06-27 MED ORDER — SODIUM CHLORIDE 0.9% FLUSH
10.0000 mL | Freq: Once | INTRAVENOUS | Status: AC
  Administered 2021-06-27: 10 mL

## 2021-06-27 MED ORDER — SODIUM CHLORIDE 0.9 % IV SOLN
1200.0000 mg | Freq: Once | INTRAVENOUS | Status: AC
  Administered 2021-06-27: 1200 mg via INTRAVENOUS
  Filled 2021-06-27: qty 20

## 2021-06-27 NOTE — Patient Instructions (Signed)
[facility]  Discharge Instructions: Thank you for choosing [facility] to provide your oncology and hematology care.   If you have a lab appointment with the [facility], please go directly to the [facility] and check in at the registration area.   Wear comfortable clothing and clothing appropriate for easy access to any Portacath or PICC line.   We strive to give you quality time with your provider. You may need to reschedule your appointment if you arrive late (15 or more minutes).  Arriving late affects you and other patients whose appointments are after yours.  Also, if you miss three or more appointments without notifying the office, you may be dismissed from the clinic at the provider's discretion.      For prescription refill requests, have your pharmacy contact our office and allow 72 hours for refills to be completed.    Today you received the following chemotherapy and/or immunotherapy agents: Atezolizumab       To help prevent nausea and vomiting after your treatment, we encourage you to take your nausea medication as directed.  BELOW ARE SYMPTOMS THAT SHOULD BE REPORTED IMMEDIATELY: *FEVER GREATER THAN 100.4 F (38 C) OR HIGHER *CHILLS OR SWEATING *NAUSEA AND VOMITING THAT IS NOT CONTROLLED WITH YOUR NAUSEA MEDICATION *UNUSUAL SHORTNESS OF BREATH *UNUSUAL BRUISING OR BLEEDING *URINARY PROBLEMS (pain or burning when urinating, or frequent urination) *BOWEL PROBLEMS (unusual diarrhea, constipation, pain near the anus) TENDERNESS IN MOUTH AND THROAT WITH OR WITHOUT PRESENCE OF ULCERS (sore throat, sores in mouth, or a toothache) UNUSUAL RASH, SWELLING OR PAIN  UNUSUAL VAGINAL DISCHARGE OR ITCHING   Items with * indicate a potential emergency and should be followed up as soon as possible or go to the Emergency Department if any problems should occur.  Please show the CHEMOTHERAPY ALERT CARD or IMMUNOTHERAPY ALERT CARD at check-in  to the Emergency Department and triage nurse.  Should you have questions after your visit or need to cancel or reschedule your appointment, please contact [facility]  Dept: [phone]  and follow the prompts.  Office hours are 8:00 a.m. to 4:30 p.m. Monday - Friday. Please note that voicemails left after 4:00 p.m. may not be returned until the following business day.  We are closed weekends and major holidays. You have access to a nurse at all times for urgent questions. Please call the main number to the clinic Dept: [phone] and follow the prompts.   For any non-urgent questions, you may also contact your provider using MyChart. We now offer e-Visits for anyone 70 and older to request care online for non-urgent symptoms. For details visit [email or web address].   Also download the MyChart app! Go to the app store, search "MyChart", open the app, select [facility], and log in with your MyChart username and password.  Due to Covid, a mask is required upon entering the hospital/clinic. If you do not have a mask, one will be given to you upon arrival. For doctor visits, patients may have 1 support person aged 34 or older with them. For treatment visits, patients cannot have anyone with them due to current Covid guidelines and our immunocompromised population.

## 2021-06-28 DIAGNOSIS — J449 Chronic obstructive pulmonary disease, unspecified: Secondary | ICD-10-CM | POA: Diagnosis not present

## 2021-06-28 LAB — TOXASSURE SELECT,+ANTIDEPR,UR

## 2021-06-28 NOTE — Progress Notes (Signed)
[facility] [phone]   [phone]  PROGRESS NOTE  Patient Care Team: Allwardt, Randa Evens, PA-C as PCP - General (Physician Assistant) Milus Banister, MD as Attending Physician (Gastroenterology) Dr. Darylene Price, MD as Consulting Physician (Orthopedic Surgery) Latanya Maudlin, MD as Consulting Physician (Orthopedic Surgery) Kathrynn Ducking, MD (Inactive) as Consulting Physician (Neurology) Okey Regal, [place] as Consulting Physician (Optometry) Delice Lesch Lezlie Octave, MD as Consulting Physician (Neurology) Valrie Hart, RN as Oncology Nurse Navigator (Oncology) Leona Singleton, RN as [place] (Pharmacist)  Hematological/Oncological History # Small Cell Lung Cancer, Extensive Stage 07/05/2020: CT abdomen for lower abdominal pain. New pulmonary nodular density noted 07/06/2020: CT chest showed 2.2 cm macrolobulated right lower lobe pulmonary nodule (favored) versus pathologically enlarged infrahilar lymph node 10/13/2020: PET CT scan performed, findings show 2 cm right lower lobe lung mass is hypermetabolic and consistent with primary lung neoplasm. Additionally found hypermetabolic 17 mm soft tissue lesion between the descending duodenum and the pancreatic head  10/19/2020: EGD to biopsy hypermetabolic lymph node. Biopsy results show small cell lung cancer 10/26/2020: establish care with Dr. Lorenso Courier  11/13/2020: Cycle 1 Day 1 of Carbo/Etop/Atezolizumab 12/04/2020: Cycle 2 Day 1 of Carbo/Etop/Atezolizumab 11/22-11/25/2022: admitted for E. Coli bacteremia/sepsis. Start of Cycle 3 delayed. 01/02/2021: Cycle 3 Day 1 of Carbo/Etop/Atezolizumab 01/23/2021: Cycle 4 Day 1 of Carbo/Etop/Atezolizumab 02/22/2021: Cycle 5 Day 1 of Atezolizumab Maintenance. Delayed start due to patient's COVID infection.  03/14/2021: Cycle 6 Day 1 of Atezolizumab Maintenance 04/05/2021: Cycle 7 Day 1 of Atezolizumab  Maintenance 04/25/2021: Cycle 8 Day 1 of Atezolizumab Maintenance 05/16/2021: Cycle 9 Day 1 of Atezolizumab Maintenance 06/06/2021: Cycle 10 Day 1 of Atezolizumab Maintenance 06/27/2021:  Cycle 11 Day 1 of Atezolizumab Maintenance  Interval History:  [name] 66 y.o. female with medical history significant for extensive stage small cell lung cancer who presents for a follow up visit. The patient's last visit was on 06/06/2021. In the interim since the last visit she has completed cycle 10 of chemotherapy. She presents today to start cycle 11 of chemotherapy.  On exam today [name] reports that her pain has improved recently after she was started on methadone by her pain management team. She feels more energetic and is able to do more ADLs independently. She has a better appetite with 3-4 lb weight gian since 06/06/2021. She denies nausea, vomiting or diarrhea. She reports constipation but has a BM daily. She denies easy bruising or signs of active bleeding. She has persistent shortness of breath with exertion but none at rest. She has  She reports improvement of numbness and tingling in her hands and feet.  She is currently taking Cymbalta for neuropathic pain.She denies fevers, chills, sweats, chest pain or peripheral edema.  She has no other complaints. A full 10 point ROS is listed below.   MEDICAL HISTORY:  Past Medical History:  Diagnosis Date   Allergic rhinitis    Anemia    Anxiety    Chicken pox    Chronic back pain    COPD (chronic obstructive pulmonary disease) (HCC)    Depression    DM (diabetes mellitus) ([place])    Essential hypertension    GERD (gastroesophageal reflux disease)    Headache    migraines   History of gastritis    EGD 2015   History of home oxygen therapy    2 liters at hs last 6 months   Hyperlipidemia  Hypothyroidism    Migraines    Osteoarthritis    oa   Scoliosis     SURGICAL HISTORY: Past Surgical History:  Procedure Laterality Date    APPENDECTOMY     1985   BIOPSY  07/24/2017   Procedure: BIOPSY;  Surgeon: Milus Banister, MD;  Location: [facility];  Service: Endoscopy;;   CARDIAC CATHETERIZATION N/A 10/31/2015   Procedure: Left Heart Cath and Coronary Angiography;  Surgeon: Leonie Man, MD;  Location: [facility];  Service: Cardiovascular;  Laterality: N/A;   CARPAL TUNNEL RELEASE Left    CARPAL TUNNEL RELEASE Right    CHOLECYSTECTOMY  late 1980's   COLONOSCOPY WITH PROPOFOL N/A 07/24/2017   Procedure: COLONOSCOPY WITH PROPOFOL;  Surgeon: Milus Banister, MD;  [facility];  Service: Endoscopy;  Laterality: N/A;   ESOPHAGOGASTRODUODENOSCOPY N/A 07/24/2017   Procedure: ESOPHAGOGASTRODUODENOSCOPY (EGD);  Surgeon: Milus Banister, MD;  Location: [facility];  Service: Endoscopy;  Laterality: N/A;   ESOPHAGOGASTRODUODENOSCOPY (EGD) WITH PROPOFOL N/A 10/19/2020   Procedure: ESOPHAGOGASTRODUODENOSCOPY (EGD) WITH PROPOFOL;  Surgeon: Milus Banister, MD;  [facility];  Service: Endoscopy;  Laterality: N/A;   FINE NEEDLE ASPIRATION N/A 10/19/2020   Procedure: FINE NEEDLE ASPIRATION (FNA) LINEAR;  Surgeon: Milus Banister, MD;  [facility];  Service: Endoscopy;  Laterality: N/A;   GALLBLADDER SURGERY  1991   HIP CLOSED REDUCTION Right 01/08/2016   Procedure: CLOSED MANIPULATION HIP;  Surgeon: Susa Day, MD;  [facility]: [facility];  Service: Orthopedics;  Laterality: Right;   HIP CLOSED REDUCTION Right 01/19/2016   Procedure: ATTEMPTED CLOSED REDUCTION RIGHT HIP;  Surgeon: Wylene Simmer, MD;  [facility];  Service: Orthopedics;  Laterality: Right;   HIP CLOSED REDUCTION Right 01/20/2016   Procedure: CLOSED REDUCTION RIGHT TOTAL HIP;  Surgeon: Paralee Cancel, MD;  [facility];  Service: Orthopedics;  Laterality: Right;   HIP CLOSED REDUCTION Right 02/17/2016   Procedure: CLOSED REDUCTION RIGHT TOTAL HIP;  Surgeon: Rod Can, MD;  Location: [facility];  Service: Orthopedics;   Laterality: Right;   HIP CLOSED REDUCTION Right 02/28/2016   Procedure: CLOSED REDUCTION HIP;  Surgeon: Nicholes Stairs, MD;  [facility];  Service: Orthopedics;  Laterality: Right;   IR IMAGING GUIDED PORT INSERTION  11/01/2020   POLYPECTOMY  07/24/2017   Procedure: POLYPECTOMY;  Surgeon: Milus Banister, MD;  [facility];  Service: Endoscopy;;   [place], with 1 ovary removed and 2 nd ovary removed 2003   TOTAL HIP ARTHROPLASTY Right    Original surgery 2006 with revision 2010   TOTAL HIP REVISION Right 01/01/2016   Procedure: TOTAL HIP REVISION;  Surgeon: Paralee Cancel, MD;  [facility];  Service: Orthopedics;  Laterality: Right;   TOTAL HIP REVISION Right 03/02/2016   Procedure: TOTAL HIP REVISION;  Surgeon: Paralee Cancel, MD;  [facility];  Service: Orthopedics;  Laterality: Right;   TOTAL HIP REVISION Right 09/02/2016   Procedure: Right hip constrained liner- posterior;  Surgeon: Paralee Cancel, MD;  [facility];  Service: Orthopedics;  Laterality: Right;   ULNAR NERVE TRANSPOSITION Right    UPPER ESOPHAGEAL ENDOSCOPIC ULTRASOUND (EUS) N/A 10/19/2020   Procedure: UPPER ESOPHAGEAL ENDOSCOPIC ULTRASOUND (EUS);  Surgeon: Milus Banister, MD;  Location: [facility];  Service: Endoscopy;  Laterality: N/A;  periduodenal lesion    SOCIAL HISTORY: Social History   Socioeconomic History   Marital status: Married  Spouse name: Not on file   Number of children: 2   Years of education: Not on file   Highest education level: Not on file  Occupational History   Occupation: disabled   Occupation: disabled  Tobacco Use   Smoking status: Every Day    Packs/day: 2.00    Years: 46.00    Pack years: 92.00    Types: Cigarettes   Smokeless tobacco: Never   Tobacco comments:    2 packs of cigarettes smoked daily ARJ 04/10/21  Vaping Use   Vaping Use: Never used  Substance and Sexual Activity   Alcohol use: No    Drug use: No   Sexual activity: Not Currently    Partners: Male  Other Topics Concern   Not on file  Social History Narrative   Right handed    Caffeine~ 2 cups per day    Lives at home with husband (strained relationship)   Primary caretaker for disabled brother who had aneurism   Daughter died 07-05-18    Social Determinants of Health   Financial Resource Strain: Medium Risk   Difficulty of Paying Living Expenses: Somewhat hard  Food Insecurity: No Food Insecurity   Worried About Charity fundraiser in the Last Year: Never true   Arboriculturist in the Last Year: Never true  Transportation Needs: Not on file  Physical Activity: Inactive   Days of Exercise per Week: 0 days   Minutes of Exercise per Session: 0 min  Stress: Stress Concern Present   Feeling of Stress : To some extent  Social Connections: Moderately Integrated   Frequency of Communication with Friends and Family: More than three times a week   Frequency of Social Gatherings with Friends and Family: Once a week   Attends Religious Services: 1 to 4 times per year   Active Member of [employer] or Organizations: No   Attends Music therapist: Never   Marital Status: Married  Human resources officer Violence: Not At Risk   Fear of Current or Ex-Partner: No   Emotionally Abused: No   Physically Abused: No   Sexually Abused: No    FAMILY HISTORY: Family History  Problem Relation Age of Onset   COPD Mother    Heart disease Mother    Lung disease Father        Asbestosis   Heart attack Father    Heart disease Father    Cerebral aneurysm Brother    Aneurysm Brother        Brain   Drug abuse Daughter    Epilepsy Son    Alcohol abuse Son    Drug abuse Son    Arthritis Maternal Grandmother    Heart disease Maternal Grandmother    Asthma Maternal Grandfather    Cancer Maternal Grandfather    Arthritis Paternal Grandmother    Heart disease Paternal Grandmother    Stroke Paternal Grandmother    Early death  Paternal Grandfather    Heart disease Paternal Grandfather     ALLERGIES:  is allergic to metformin and related, nsaids, wellbutrin [bupropion], aleve [naproxen sodium], codeine, penicillins, and sulfonamide derivatives.  MEDICATIONS:  Current Outpatient Medications  Medication Sig Dispense Refill   albuterol (PROAIR HFA) 108 (90 Base) MCG/ACT inhaler 2 puffs every 4 hours as needed only  if your can't catch your breath 18 g 3   ALPRAZolam (XANAX) 1 MG tablet Take 1 tablet po TID as needed for anxiety. 90 tablet 2   atorvastatin (  LIPITOR) 20 MG tablet TAKE 1 TABLET BY MOUTH AT BEDTIME 90 tablet 1   benzonatate (TESSALON) 100 MG capsule TAKE 1 CAPSULE BY MOUTH THREE TIMES DAILY 30 capsule 1   cyclobenzaprine (FLEXERIL) 10 MG tablet Take 1 tablet (10 mg total) by mouth 3 (three) times daily as needed for muscle spasms. 90 tablet 5   DULoxetine (CYMBALTA) 60 MG capsule TAKE TWO CAPSULES BY MOUTH DAILY 60 capsule 5   fluticasone (FLONASE) 50 MCG/ACT nasal spray Place 2 sprays into both nostrils daily. 16 g 6   fluticasone-salmeterol (ADVAIR DISKUS) 250-50 MCG/ACT AEPB Inhale 1 puff into the lungs in the morning and at bedtime. 60 each 5   furosemide (LASIX) 20 MG tablet TAKE 1 TABLET BY MOUTH EVERY DAY AS NEEDED 30 tablet 90   HYDROcodone-acetaminophen (NORCO) 10-325 MG tablet Take 1 tablet by mouth every 4 (four) hours as needed (max 5 tabs/day). 150 tablet 0   levothyroxine (SYNTHROID) 137 MCG tablet Take 1 tablet (137 mcg total) by mouth daily before breakfast. 30 tablet 5   loratadine (CLARITIN) 10 MG tablet Take 10 mg by mouth daily.     methadone (DOLOPHINE) 5 MG tablet Take 0.5 tablets (2.5 mg total) by mouth every 12 (twelve) hours. 30 tablet 0   OLANZapine (ZYPREXA) 10 MG tablet Take 0.5 tablets (5 mg total) by mouth at bedtime. 30 tablet 2   omeprazole (PRILOSEC) 40 MG capsule TAKE ONE CAPSULE BY MOUTH TWICE DAILY 60 capsule 2   ondansetron (ZOFRAN) 8 MG tablet TAKE 2 TABLETS BY MOUTH  TWICE DAILY AS NEEDED (Patient taking differently: 8 mg 2 (two) times daily.) 60 tablet 0   OXYGEN Inhale 3 L into the lungs at bedtime. Uses At Night     potassium chloride (KLOR-CON M) 10 MEQ tablet TAKE 1 TABLET BY MOUTH AT BEDTIME 90 tablet 1   prochlorperazine (COMPAZINE) 10 MG tablet Take 1 tablet (10 mg total) by mouth every 6 (six) hours as needed for nausea or vomiting. 30 tablet 0   triamterene-hydrochlorothiazide (MAXZIDE-25) 37.5-25 MG tablet TAKE 1 TABLET BY MOUTH DAILY - EMERGENCY REFILL FAXED DR. 90 tablet 2   Budeson-Glycopyrrol-Formoterol (BREZTRI AEROSPHERE) 160-9-4.8 MCG/ACT AERO Inhale 2 puffs into the lungs in the morning and at bedtime. (Patient not taking: Reported on 06/27/2021) 10.7 g 3   No current facility-administered medications for this visit.    REVIEW OF SYSTEMS:   Constitutional: ( - ) fevers, ( - )  chills , ( - ) night sweats Eyes: ( - ) blurriness of vision, ( - ) double vision, ( - ) watery eyes Ears, nose, mouth, throat, and face: ( - ) mucositis, ( - ) sore throat Respiratory: ( - ) cough, ( +) dyspnea, ( - ) wheezes Cardiovascular: ( - ) palpitation, ( - ) chest discomfort, ( - ) lower extremity swelling Gastrointestinal:  ( -) nausea, ( - ) heartburn, ( - ) change in bowel habits Skin: ( - ) abnormal skin rashes Lymphatics: ( - ) new lymphadenopathy, ( - ) easy bruising Neurological: (+ ) numbness, ( - ) tingling, ( - ) new weaknesses Behavioral/Psych: ( - ) mood change, ( - ) new changes  All other systems were reviewed with the patient and are negative.  PHYSICAL EXAMINATION: ECOG PERFORMANCE STATUS: 1 - Symptomatic but completely ambulatory  Vitals:   06/27/21 1306  BP: (!) 143/71  Pulse: 88  Temp: 98.1 F (36.7 C)  SpO2: 94%   Filed Weights   06/27/21  1306  Weight: 203 lb (92.1 kg)    GENERAL: Well-appearing middle-age Caucasian female, alert, no distress and comfortable SKIN: skin color, texture, turgor are normal, no rashes or  significant lesions EYES: conjunctiva are pink and non-injected, sclera clear LUNGS:  normal breathing effort. Diffuse wheezing hear on ausculation.  HEART: regular rate & rhythm and no murmurs and no lower extremity edema Musculoskeletal: no cyanosis of digits and no clubbing  PSYCH: alert & oriented x 3, fluent speech NEURO: no focal motor/sensory deficits  LABORATORY DATA:  I have reviewed the data as listed    Latest Ref Rng & Units 06/27/2021   12:45 PM 06/06/2021   12:44 PM 05/16/2021    2:06 PM  CBC  WBC 4.0 - 10.5 K/uL 8.8   5.9   8.9    Hemoglobin 12.0 - 15.0 g/dL 11.9   11.9   12.4    Hematocrit 36.0 - 46.0 % 36.8   38.7   40.2    Platelets 150 - 400 K/uL 181   159   171         Latest Ref Rng & Units 06/27/2021   12:45 PM 06/06/2021   12:44 PM 05/16/2021    2:06 PM  CMP  Glucose 70 - 99 mg/dL 123   176   100    BUN 8 - 23 mg/dL 10   9   10     Creatinine 0.44 - 1.00 mg/dL 0.93   0.81   0.96    Sodium 135 - 145 mmol/L 136   139   137    Potassium 3.5 - 5.1 mmol/L 3.6   3.4   3.7    Chloride 98 - 111 mmol/L 103   105   103    CO2 22 - 32 mmol/L 29   28   28     Calcium 8.9 - 10.3 mg/dL 8.7   8.3   8.6    Total Protein 6.5 - 8.1 g/dL 6.1   6.4   6.6    Total Bilirubin 0.3 - 1.2 mg/dL 0.4   0.3   0.2    Alkaline Phos 38 - 126 U/L 87   77   77    AST 15 - 41 U/L 15   15   19     ALT 0 - 44 U/L 11   11   14       No results found for: MPROTEIN Lab Results  Component Value Date   KPAFRELGTCHN 0.75 06/30/2014   LAMBDASER 3.78 (H) 06/30/2014   KAPLAMBRATIO 0.20 (L) 06/30/2014     RADIOGRAPHIC STUDIES: No results found.  ASSESSMENT & PLAN [name] 66 y.o. female with medical history significant for extensive stage small cell lung cancer who presents for a follow up visit.   After review of the labs, review of the records, and discussion with the patient the patients findings are most consistent with extensive stage small cell lung cancer with metastasis from  the right lower lobe to the lymph nodes of the abdomen.  At this time we will pursue triple therapy with carboplatin, etoposide, and atezolizumab.  After 4 cycles we will convert to maintenance atezolizumab alone.  We previously discussed the risks and benefits of this therapy and the patient was in agreement to proceed with this treatment.  The treatment of choice consist of carboplatin, etoposide, and atezolizumab.  The regimen consists of carboplatin AUC of 5 IV on day 1, etoposide 100 mg per metered squared  IV on day 1, 2, and 3 and atezolizumab 1200 mg on day 1.  This continues for 21-day cycles.  After 4 cycles the patient proceeds with atezolizumab maintenance therapy alone.   #Small Cell Lung Cancer, Extensive Stage -- MRI of the brain shows no evidence of intracranial spread --Findings are currently consistent with metastatic small cell lung cancer with metastatic spread to the lymph nodes of the abdomen --Prognosis is poor with anticipated life span of less than 12 months -- Patient has extensive stage due to metastatic spread to lymph nodes in the abdomen --Plan to proceed with carboplatin, etoposide, and atezolizumab --will consider prophylactic cranial radiation after start of chemotherapy.  Plan: --today is Cycle 11 Day 1 of Atezolizumab maintenance --labs today were reviewed and adequate for treatment. Hgb 11.9, Plt 181. No intervention needed. --CT scan q 3 months. Last scan in March 2023 showed continued response to therapy. CT scan will be ordered before next visit --RTC in 3 weeks for next cycle of maintenance atezolizumab    #Supportive Care -- chemotherapy education complete -- port placed -- zofran 8mg  q8H PRN and compazine 10mg  PO q6H for nausea -- EMLA cream for port -- no pain medication required at this time.   #Nausea: --Symptoms improved and well controlled with zofran  #Hypokalemia:  --currently taking potassium chloride 10 mEq at bedtime --potassium level is  3.6 today. No further intervention is needed.  #Neuropathy involving fingers/feet: --Patient currently takes Cymbalta which is prescribed by her Dr. Dagoberto Ligas.   No orders of the defined types were placed in this encounter.  All questions were answered. The patient knows to call the clinic with any problems, questions or concerns.  I have spent a total of 30 minutes minutes of face-to-face and non-face-to-face time, preparing to see the patient, performing a medically appropriate examination, counseling and educating the patient, ordering medications/tests, documenting clinical information in the electronic health record, and care coordination.   Dede Query PA-C Department of Hematology/Oncology [facility] Phone: [phone]  Patient was seen with Dr. Lorenso Courier  I have read the above note and personally examined the patient. I agree with the assessment and plan as noted above.  Briefly [name] is a 66 year old female with medical history significant for extensive stage small cell lung cancer who presents for continued Tecentriq therapy.  She notes that she has been tolerating the therapy well and feels quite good.  Her hair is growing back quite nicely and she is very pleased by this.  She is not having any issues or side effects as result of this treatment.  She is willing and able to proceed with immunotherapy at this time.   Ledell Peoples, MD Department of Hematology/Oncology [facility] [facility] Phone: [phone] Pager: [phone] Email: [email or web address]   06/28/2021 12:10 PM

## 2021-06-29 ENCOUNTER — Ambulatory Visit (HOSPITAL_COMMUNITY)
Admission: RE | Admit: 2021-06-29 | Discharge: 2021-06-29 | Disposition: A | Discharge status: Home / Self Care | Payer: No Typology Code available for payment source | Source: Ambulatory Visit | Attending: Physician Assistant | Admitting: Physician Assistant

## 2021-06-29 ENCOUNTER — Telehealth: Payer: Self-pay | Admitting: *Deleted

## 2021-06-29 DIAGNOSIS — I700 Atherosclerosis of aorta: Secondary | ICD-10-CM | POA: Diagnosis not present

## 2021-06-29 DIAGNOSIS — M47816 Spondylosis without myelopathy or radiculopathy, lumbar region: Secondary | ICD-10-CM | POA: Diagnosis not present

## 2021-06-29 DIAGNOSIS — C3490 Malignant neoplasm of unspecified part of unspecified bronchus or lung: Secondary | ICD-10-CM | POA: Insufficient documentation

## 2021-06-29 DIAGNOSIS — J439 Emphysema, unspecified: Secondary | ICD-10-CM | POA: Diagnosis not present

## 2021-06-29 DIAGNOSIS — R590 Localized enlarged lymph nodes: Secondary | ICD-10-CM | POA: Diagnosis not present

## 2021-06-29 MED ORDER — IOHEXOL 300 MG/ML  SOLN
100.0000 mL | Freq: Once | INTRAMUSCULAR | Status: AC | PRN
  Administered 2021-06-29: 100 mL via INTRAVENOUS

## 2021-06-29 MED ORDER — SODIUM CHLORIDE (PF) 0.9 % IJ SOLN
INTRAMUSCULAR | Status: AC
  Filled 2021-06-29: qty 50

## 2021-06-29 NOTE — Telephone Encounter (Signed)
Urine drug screen for this encounter is consistent for prescribed medication 

## 2021-07-01 ENCOUNTER — Encounter: Payer: Self-pay | Admitting: Hematology and Oncology

## 2021-07-06 DIAGNOSIS — R69 Illness, unspecified: Secondary | ICD-10-CM | POA: Diagnosis not present

## 2021-07-13 ENCOUNTER — Telehealth: Payer: Self-pay | Admitting: Pharmacist

## 2021-07-13 NOTE — Progress Notes (Signed)
Chronic Care Management Pharmacy Assistant   Name: [name]  MRN: [id number] DOB: 06-24-1955  Reason for Encounter: General Adherence Call    Recent office visits:  None  Recent consult visits:  06/27/2021 OV (Oncology) [name], [place]; --RTC in 3 weeks for next cycle of maintenance atezolizumab  06/22/2021 OV (Phys Med) Kendra Heys, [name]; no medication changes indicated.  06/06/2021 OV (Oncology) [name]; no medication changes indicated.  05/16/2021 OV (Oncology) [name]; no medication changes indicated.  Hospital visits:  None in previous 6 months  Medications: Outpatient Encounter Medications as of 07/13/2021  Medication Sig Note   albuterol (PROAIR HFA) 108 (90 Base) MCG/ACT inhaler 2 puffs every 4 hours as needed only  if your can'[name] catch your breath    ALPRAZolam (XANAX) 1 MG tablet Take 1 tablet po TID as needed for anxiety.    atorvastatin (LIPITOR) 20 MG tablet TAKE 1 TABLET BY MOUTH AT BEDTIME    benzonatate (TESSALON) 100 MG capsule TAKE 1 CAPSULE BY MOUTH THREE TIMES DAILY    Budeson-Glycopyrrol-Formoterol (BREZTRI AEROSPHERE) 160-9-4.8 MCG/ACT AERO Inhale 2 puffs into the lungs in the morning and at bedtime. (Patient not taking: Reported on 06/27/2021) 06/27/2021: Will finish trelegy in 17 days   cyclobenzaprine (FLEXERIL) 10 MG tablet Take 1 tablet (10 mg total) by mouth 3 (three) times daily as needed for muscle spasms.    DULoxetine (CYMBALTA) 60 MG capsule TAKE TWO CAPSULES BY MOUTH DAILY    fluticasone (FLONASE) 50 MCG/ACT nasal spray Place 2 sprays into both nostrils daily.    fluticasone-salmeterol (ADVAIR DISKUS) 250-50 MCG/ACT AEPB Inhale 1 puff into the lungs in the morning and at bedtime.    furosemide (LASIX) 20 MG tablet TAKE 1 TABLET BY MOUTH EVERY DAY AS NEEDED    HYDROcodone-acetaminophen (NORCO) 10-325 MG tablet Take 1 tablet by mouth every 4 (four) hours as needed (max 5 tabs/day).    levothyroxine  (SYNTHROID) 137 MCG tablet Take 1 tablet (137 mcg total) by mouth daily before breakfast.    loratadine (CLARITIN) 10 MG tablet Take 10 mg by mouth daily.    methadone (DOLOPHINE) 5 MG tablet Take 0.5 tablets (2.5 mg total) by mouth every 12 (twelve) hours.    OLANZapine (ZYPREXA) 10 MG tablet Take 0.5 tablets (5 mg total) by mouth at bedtime.    omeprazole (PRILOSEC) 40 MG capsule TAKE ONE CAPSULE BY MOUTH TWICE DAILY    ondansetron (ZOFRAN) 8 MG tablet TAKE 2 TABLETS BY MOUTH TWICE DAILY AS NEEDED (Patient taking differently: 8 mg 2 (two) times daily.)    OXYGEN Inhale 3 L into the lungs at bedtime. Uses At Night 11/13/2020: Uses 3l/min [place] @ night   potassium chloride (KLOR-CON M) 10 MEQ tablet TAKE 1 TABLET BY MOUTH AT BEDTIME    prochlorperazine (COMPAZINE) 10 MG tablet Take 1 tablet (10 mg total) by mouth every 6 (six) hours as needed for nausea or vomiting.    triamterene-hydrochlorothiazide (MAXZIDE-25) 37.5-25 MG tablet TAKE 1 TABLET BY MOUTH DAILY - EMERGENCY REFILL FAXED DR.    No facility-administered encounter medications on file as of 07/13/2021.   [place] for General Review Call   Chart Review:  Have there been any documented new, changed, or discontinued medications since last visit? No  Has there been any documented recent hospitalizations or ED visits since last visit with Clinical Pharmacist? No   Adherence Review:  Does the Clinical Pharmacist Assistant have access  to adherence rates? Yes Adherence rates for STAR metric medications: Atorvastatin 20 mg last filled 05/11/2021 90 DS Does the patient have >5 day gap between last estimated fill dates for any of the above medications or other medication gaps? No Reason for medication gaps.   Disease State Questions:  Able to connect with Patient? Yes Did patient have any problems with their health recently? Yes Note problems and Concerns: Patient has diagnosis of Lung Cancer. Have you had any  admissions or emergency room visits or worsening of your condition(s) since last visit? No Have you had any visits with new specialists or providers since your last visit? No Have you had any new health care problem(s) since your last visit? No Have you run out of any of your medications since you last spoke with clinical pharmacist? No Are there any medications you are not taking as prescribed? No Are you having any issues or side effects with your medications? No Do you have any other health concerns or questions you want to discuss with your Clinical Pharmacist before your next visit? No Note additional concerns and questions from Patient. Are there any health concerns that you feel we can do a better job addressing? No Are you having any problems with any of the following since the last visit:  None 12. Any falls since last visit? No 13. Any increased or uncontrolled pain since last visit? Yes  Details: Patient states her pain medication is helping some.   -Patient states she has received Beztri and will start using it soon.  Care Gaps: Medicare Annual Wellness: Completed 05/07/2021 Ophthalmology Exam: [name] - never done Hemoglobin A1C: 6.5% on 01/05/2021 Colonoscopy: Next due on 07/25/2027 Dexa Scan: Completed Mammogram: Next due on 05/04/2022  Future Appointments  Date Time Provider [place]  07/18/2021  1:45 PM [facility] [place] None  07/18/2021  2:20 PM [name] [facility] None  07/18/2021  3:00 PM [facility] INFUSION [facility] None  07/18/2021  3:15 PM Morrell Riddle, RD [facility] None  08/08/2021 10:15 AM [facility] [place] FLUSH [facility] None  08/08/2021 10:40 AM [name] [facility] None  08/08/2021 11:30 AM [facility] INFUSION [facility] None  10/22/2021  3:20 PM [place], Jinny Blossom, [name] [facility] CPR  11/12/2021  3:30 PM [facility] CCM PHARMACIST [facility]EC  05/16/2022  1:30 PM [facility] HEALTH COACH [facility]   Star Rating  Drugs: Atorvastatin 20 mg last filled 05/11/2021 90 DS  April D Calhoun, [place] Pharmacist Assistant [phone]

## 2021-07-16 DIAGNOSIS — E7800 Pure hypercholesterolemia, unspecified: Secondary | ICD-10-CM | POA: Diagnosis not present

## 2021-07-16 DIAGNOSIS — E119 Type 2 diabetes mellitus without complications: Secondary | ICD-10-CM | POA: Diagnosis not present

## 2021-07-16 DIAGNOSIS — H524 Presbyopia: Secondary | ICD-10-CM | POA: Diagnosis not present

## 2021-07-16 DIAGNOSIS — Z0100 Encounter for examination of eyes and vision without abnormal findings: Secondary | ICD-10-CM | POA: Diagnosis not present

## 2021-07-18 ENCOUNTER — Inpatient Hospital Stay (HOSPITAL_BASED_OUTPATIENT_CLINIC_OR_DEPARTMENT_OTHER): Payer: No Typology Code available for payment source | Admitting: Hematology and Oncology

## 2021-07-18 ENCOUNTER — Other Ambulatory Visit: Payer: Self-pay

## 2021-07-18 ENCOUNTER — Inpatient Hospital Stay: Payer: No Typology Code available for payment source | Attending: Hematology and Oncology

## 2021-07-18 ENCOUNTER — Inpatient Hospital Stay: Payer: No Typology Code available for payment source | Admitting: Dietician

## 2021-07-18 ENCOUNTER — Inpatient Hospital Stay: Payer: No Typology Code available for payment source

## 2021-07-18 VITALS — BP 133/80 | HR 87 | Temp 97.6°F | Resp 16 | Wt 200.9 lb

## 2021-07-18 VITALS — BP 139/85 | HR 82 | Temp 97.7°F | Resp 16

## 2021-07-18 DIAGNOSIS — Z8261 Family history of arthritis: Secondary | ICD-10-CM | POA: Insufficient documentation

## 2021-07-18 DIAGNOSIS — J449 Chronic obstructive pulmonary disease, unspecified: Secondary | ICD-10-CM | POA: Diagnosis not present

## 2021-07-18 DIAGNOSIS — Z9049 Acquired absence of other specified parts of digestive tract: Secondary | ICD-10-CM | POA: Insufficient documentation

## 2021-07-18 DIAGNOSIS — Z5986 Financial insecurity: Secondary | ICD-10-CM | POA: Insufficient documentation

## 2021-07-18 DIAGNOSIS — Z880 Allergy status to penicillin: Secondary | ICD-10-CM | POA: Diagnosis not present

## 2021-07-18 DIAGNOSIS — R072 Precordial pain: Secondary | ICD-10-CM | POA: Insufficient documentation

## 2021-07-18 DIAGNOSIS — Z888 Allergy status to other drugs, medicaments and biological substances status: Secondary | ICD-10-CM | POA: Insufficient documentation

## 2021-07-18 DIAGNOSIS — G629 Polyneuropathy, unspecified: Secondary | ICD-10-CM | POA: Insufficient documentation

## 2021-07-18 DIAGNOSIS — R110 Nausea: Secondary | ICD-10-CM | POA: Insufficient documentation

## 2021-07-18 DIAGNOSIS — Z90721 Acquired absence of ovaries, unilateral: Secondary | ICD-10-CM | POA: Insufficient documentation

## 2021-07-18 DIAGNOSIS — Z809 Family history of malignant neoplasm, unspecified: Secondary | ICD-10-CM | POA: Insufficient documentation

## 2021-07-18 DIAGNOSIS — Z814 Family history of other substance abuse and dependence: Secondary | ICD-10-CM | POA: Insufficient documentation

## 2021-07-18 DIAGNOSIS — E876 Hypokalemia: Secondary | ICD-10-CM | POA: Insufficient documentation

## 2021-07-18 DIAGNOSIS — Z8719 Personal history of other diseases of the digestive system: Secondary | ICD-10-CM | POA: Diagnosis not present

## 2021-07-18 DIAGNOSIS — Z7951 Long term (current) use of inhaled steroids: Secondary | ICD-10-CM | POA: Diagnosis not present

## 2021-07-18 DIAGNOSIS — R0600 Dyspnea, unspecified: Secondary | ICD-10-CM | POA: Insufficient documentation

## 2021-07-18 DIAGNOSIS — F32A Depression, unspecified: Secondary | ICD-10-CM | POA: Insufficient documentation

## 2021-07-18 DIAGNOSIS — Z882 Allergy status to sulfonamides status: Secondary | ICD-10-CM | POA: Diagnosis not present

## 2021-07-18 DIAGNOSIS — F419 Anxiety disorder, unspecified: Secondary | ICD-10-CM | POA: Diagnosis not present

## 2021-07-18 DIAGNOSIS — I700 Atherosclerosis of aorta: Secondary | ICD-10-CM | POA: Diagnosis not present

## 2021-07-18 DIAGNOSIS — E119 Type 2 diabetes mellitus without complications: Secondary | ICD-10-CM | POA: Diagnosis not present

## 2021-07-18 DIAGNOSIS — I10 Essential (primary) hypertension: Secondary | ICD-10-CM | POA: Insufficient documentation

## 2021-07-18 DIAGNOSIS — Z886 Allergy status to analgesic agent status: Secondary | ICD-10-CM | POA: Insufficient documentation

## 2021-07-18 DIAGNOSIS — Z5112 Encounter for antineoplastic immunotherapy: Secondary | ICD-10-CM | POA: Insufficient documentation

## 2021-07-18 DIAGNOSIS — Z885 Allergy status to narcotic agent status: Secondary | ICD-10-CM | POA: Insufficient documentation

## 2021-07-18 DIAGNOSIS — Z836 Family history of other diseases of the respiratory system: Secondary | ICD-10-CM | POA: Insufficient documentation

## 2021-07-18 DIAGNOSIS — F17210 Nicotine dependence, cigarettes, uncomplicated: Secondary | ICD-10-CM | POA: Diagnosis not present

## 2021-07-18 DIAGNOSIS — R200 Anesthesia of skin: Secondary | ICD-10-CM | POA: Insufficient documentation

## 2021-07-18 DIAGNOSIS — E039 Hypothyroidism, unspecified: Secondary | ICD-10-CM | POA: Insufficient documentation

## 2021-07-18 DIAGNOSIS — C3490 Malignant neoplasm of unspecified part of unspecified bronchus or lung: Secondary | ICD-10-CM

## 2021-07-18 DIAGNOSIS — E785 Hyperlipidemia, unspecified: Secondary | ICD-10-CM | POA: Insufficient documentation

## 2021-07-18 DIAGNOSIS — Z823 Family history of stroke: Secondary | ICD-10-CM | POA: Insufficient documentation

## 2021-07-18 DIAGNOSIS — Z8249 Family history of ischemic heart disease and other diseases of the circulatory system: Secondary | ICD-10-CM | POA: Insufficient documentation

## 2021-07-18 DIAGNOSIS — C3431 Malignant neoplasm of lower lobe, right bronchus or lung: Secondary | ICD-10-CM | POA: Insufficient documentation

## 2021-07-18 DIAGNOSIS — Z820 Family history of epilepsy and other diseases of the nervous system: Secondary | ICD-10-CM | POA: Insufficient documentation

## 2021-07-18 DIAGNOSIS — Z95828 Presence of other vascular implants and grafts: Secondary | ICD-10-CM

## 2021-07-18 DIAGNOSIS — Z79899 Other long term (current) drug therapy: Secondary | ICD-10-CM | POA: Insufficient documentation

## 2021-07-18 DIAGNOSIS — Z79890 Hormone replacement therapy (postmenopausal): Secondary | ICD-10-CM | POA: Diagnosis not present

## 2021-07-18 LAB — CBC WITH DIFFERENTIAL (CANCER CENTER ONLY)
Abs Immature Granulocytes: 0.02 10*3/uL (ref 0.00–0.07)
Basophils Absolute: 0.1 10*3/uL (ref 0.0–0.1)
Basophils Relative: 1 %
Eosinophils Absolute: 0.3 10*3/uL (ref 0.0–0.5)
Eosinophils Relative: 4 %
HCT: 38.5 % (ref 36.0–46.0)
Hemoglobin: 12.2 g/dL (ref 12.0–15.0)
Immature Granulocytes: 0 %
Lymphocytes Relative: 28 %
Lymphs Abs: 2.3 10*3/uL (ref 0.7–4.0)
MCH: 26.3 pg (ref 26.0–34.0)
MCHC: 31.7 g/dL (ref 30.0–36.0)
MCV: 83 fL (ref 80.0–100.0)
Monocytes Absolute: 0.6 10*3/uL (ref 0.1–1.0)
Monocytes Relative: 7 %
Neutro Abs: 4.8 10*3/uL (ref 1.7–7.7)
Neutrophils Relative %: 60 %
Platelet Count: 172 10*3/uL (ref 150–400)
RBC: 4.64 MIL/uL (ref 3.87–5.11)
RDW: 20.1 % — ABNORMAL HIGH (ref 11.5–15.5)
WBC Count: 8 10*3/uL (ref 4.0–10.5)
nRBC: 0 % (ref 0.0–0.2)

## 2021-07-18 LAB — CMP (CANCER CENTER ONLY)
ALT: 12 U/L (ref 0–44)
AST: 16 U/L (ref 15–41)
Albumin: 3.6 g/dL (ref 3.5–5.0)
Alkaline Phosphatase: 83 U/L (ref 38–126)
Anion gap: 8 (ref 5–15)
BUN: 12 mg/dL (ref 8–23)
CO2: 28 mmol/L (ref 22–32)
Calcium: 8.3 mg/dL — ABNORMAL LOW (ref 8.9–10.3)
Chloride: 99 mmol/L (ref 98–111)
Creatinine: 0.89 mg/dL (ref 0.44–1.00)
GFR, Estimated: >60 mL/min (ref >60)
Glucose, Bld: 97 mg/dL (ref 70–99)
Potassium: 3.6 mmol/L (ref 3.5–5.1)
Sodium: 135 mmol/L (ref 135–145)
Total Bilirubin: 0.6 mg/dL (ref 0.3–1.2)
Total Protein: 6.9 g/dL (ref 6.5–8.1)

## 2021-07-18 LAB — TSH: TSH: 5.836 u[IU]/mL — ABNORMAL HIGH (ref 0.350–4.500)

## 2021-07-18 MED ORDER — HEPARIN SOD (PORK) LOCK FLUSH 100 UNIT/ML IV SOLN
500.0000 [IU] | Freq: Once | INTRAVENOUS | Status: AC | PRN
  Administered 2021-07-18: 500 [IU]

## 2021-07-18 MED ORDER — MIRTAZAPINE 7.5 MG PO TABS: 7.5000 mg | ORAL_TABLET | Freq: Every day | ORAL | 2 refills | Status: DC

## 2021-07-18 MED ORDER — SODIUM CHLORIDE 0.9 % IV SOLN
1200.0000 mg | Freq: Once | INTRAVENOUS | Status: AC
  Administered 2021-07-18: 1200 mg via INTRAVENOUS
  Filled 2021-07-18: qty 20

## 2021-07-18 MED ORDER — SODIUM CHLORIDE 0.9 % IV SOLN: Freq: Once | INTRAVENOUS | Status: AC

## 2021-07-18 MED ORDER — SODIUM CHLORIDE 0.9% FLUSH
10.0000 mL | INTRAVENOUS | Status: DC | PRN
  Administered 2021-07-18: 10 mL

## 2021-07-18 MED ORDER — SODIUM CHLORIDE 0.9% FLUSH
10.0000 mL | Freq: Once | INTRAVENOUS | Status: AC
  Administered 2021-07-18: 10 mL

## 2021-07-18 NOTE — Progress Notes (Signed)
[place] [phone]   [phone]  PROGRESS NOTE  Patient Care Team: Allwardt, Randa Evens, PA-C as PCP - General (Physician Assistant) Milus Banister, MD as Attending Physician (Gastroenterology) Dr. Darylene Price, MD as Consulting Physician (Orthopedic Surgery) Latanya Maudlin, MD as Consulting Physician (Orthopedic Surgery) Kathrynn Ducking, MD (Inactive) as Consulting Physician (Neurology) Okey Regal, [place] as Consulting Physician (Optometry) Delice Lesch Lezlie Octave, MD as Consulting Physician (Neurology) Valrie Hart, RN as Oncology Nurse Navigator (Oncology) Leona Singleton, RN as [place], [facility] (Pharmacist)  Hematological/Oncological History # Small Cell Lung Cancer, Extensive Stage 07/05/2020: CT abdomen for lower abdominal pain. New pulmonary nodular density noted 07/06/2020: CT chest showed 2.2 cm macrolobulated right lower lobe pulmonary nodule (favored) versus pathologically enlarged infrahilar lymph node 10/13/2020: PET CT scan performed, findings show 2 cm right lower lobe lung mass is hypermetabolic and consistent with primary lung neoplasm. Additionally found hypermetabolic 17 mm soft tissue lesion between the descending duodenum and the pancreatic head  10/19/2020: EGD to biopsy hypermetabolic lymph node. Biopsy results show small cell lung cancer 10/26/2020: establish care with Dr. Lorenso Courier  11/13/2020: Cycle 1 Day 1 of Carbo/Etop/Atezolizumab 12/04/2020: Cycle 2 Day 1 of Carbo/Etop/Atezolizumab 11/22-11/25/2022: admitted for E. Coli bacteremia/sepsis. Start of Cycle 3 delayed. 01/02/2021: Cycle 3 Day 1 of Carbo/Etop/Atezolizumab 01/23/2021: Cycle 4 Day 1 of Carbo/Etop/Atezolizumab 02/22/2021: Cycle 5 Day 1 of Atezolizumab Maintenance. Delayed start due to patient's COVID infection.  03/14/2021: Cycle 6 Day 1 of Atezolizumab Maintenance 04/05/2021: Cycle 7 Day 1 of Atezolizumab  Maintenance 04/25/2021: Cycle 8 Day 1 of Atezolizumab Maintenance 05/16/2021: Cycle 9 Day 1 of Atezolizumab Maintenance 06/06/2021: Cycle 10 Day 1 of Atezolizumab Maintenance 06/27/2021:  Cycle 11 Day 1 of Atezolizumab Maintenance 07/18/2021: Cycle 12 Day 1 of Atezolizumab Maintenance  Interval History:  [name] 66 y.o. female with medical history significant for extensive stage small cell lung cancer who presents for a follow up visit. The patient's last visit was on 07/18/2021. In the interim since the last visit she has completed cycle 11 of chemotherapy. She presents today to start cycle 12 of chemotherapy.  On exam today [name] reports she tolerated the last round of treatment quite well.  She notes that she is having some hurting in the middle of her back and can be painful on deep inspiration.  She also is having some pain in her sternum and under her ribs.  She does endorse having increased cough with some occasional sputum production.  She notes that she is currently being evaluated for cataract surgery.  She notes her energy is 0.5 out of 10.  She reports having "virtually no energy".  She notes that she sleeps very poorly and only gets about 2 hours of sleep or so at a time.  She had been taking Zyprexa which was not effective and therefore she stopped it.  She is also stopped taking Advair.  Otherwise she is been tolerating treatment well with no major side effects. She denies fevers, chills, sweats, chest pain or peripheral edema.  She has no other complaints. A full 10 point ROS is listed below.   MEDICAL HISTORY:  Past Medical History:  Diagnosis Date   Allergic rhinitis    Anemia    Anxiety    Chicken pox    Chronic back pain    COPD (chronic obstructive pulmonary disease) (HCC)    Depression    DM (diabetes mellitus) (  [place])    Essential hypertension    GERD (gastroesophageal reflux disease)    Headache    migraines   History of gastritis    EGD 2015   History of  home oxygen therapy    2 liters at hs last 6 months   Hyperlipidemia    Hypothyroidism    Migraines    Osteoarthritis    oa   Scoliosis     SURGICAL HISTORY: Past Surgical History:  Procedure Laterality Date   APPENDECTOMY     1985   BIOPSY  07/24/2017   Procedure: BIOPSY;  Surgeon: Milus Banister, MD;  Location: [facility];  Service: Endoscopy;;   CARDIAC CATHETERIZATION N/A 10/31/2015   Procedure: Left Heart Cath and Coronary Angiography;  Surgeon: Leonie Man, MD;  Location: [facility];  Service: Cardiovascular;  Laterality: N/A;   CARPAL TUNNEL RELEASE Left    CARPAL TUNNEL RELEASE Right    CHOLECYSTECTOMY  late 1980's   COLONOSCOPY WITH PROPOFOL N/A 07/24/2017   Procedure: COLONOSCOPY WITH PROPOFOL;  Surgeon: Milus Banister, MD;  [facility];  Service: Endoscopy;  Laterality: N/A;   ESOPHAGOGASTRODUODENOSCOPY N/A 07/24/2017   Procedure: ESOPHAGOGASTRODUODENOSCOPY (EGD);  Surgeon: Milus Banister, MD;  Location: [facility];  Service: Endoscopy;  Laterality: N/A;   ESOPHAGOGASTRODUODENOSCOPY (EGD) WITH PROPOFOL N/A 10/19/2020   Procedure: ESOPHAGOGASTRODUODENOSCOPY (EGD) WITH PROPOFOL;  Surgeon: Milus Banister, MD;  [facility];  Service: Endoscopy;  Laterality: N/A;   FINE NEEDLE ASPIRATION N/A 10/19/2020   Procedure: FINE NEEDLE ASPIRATION (FNA) LINEAR;  Surgeon: Milus Banister, MD;  [facility];  Service: Endoscopy;  Laterality: N/A;   GALLBLADDER SURGERY  1991   HIP CLOSED REDUCTION Right 01/08/2016   Procedure: CLOSED MANIPULATION HIP;  Surgeon: Susa Day, MD;  [facility]: [facility];  Service: Orthopedics;  Laterality: Right;   HIP CLOSED REDUCTION Right 01/19/2016   Procedure: ATTEMPTED CLOSED REDUCTION RIGHT HIP;  Surgeon: Wylene Simmer, MD;  [facility]: [facility];  Service: Orthopedics;  Laterality: Right;   HIP CLOSED REDUCTION Right 01/20/2016   Procedure: CLOSED REDUCTION RIGHT TOTAL HIP;  Surgeon: Paralee Cancel, MD;   [facility];  Service: Orthopedics;  Laterality: Right;   HIP CLOSED REDUCTION Right 02/17/2016   Procedure: CLOSED REDUCTION RIGHT TOTAL HIP;  Surgeon: Rod Can, MD;  Location: [facility];  Service: Orthopedics;  Laterality: Right;   HIP CLOSED REDUCTION Right 02/28/2016   Procedure: CLOSED REDUCTION HIP;  Surgeon: Nicholes Stairs, MD;  [facility];  Service: Orthopedics;  Laterality: Right;   IR IMAGING GUIDED PORT INSERTION  11/01/2020   POLYPECTOMY  07/24/2017   Procedure: POLYPECTOMY;  Surgeon: Milus Banister, MD;  Location: [facility];  Service: Endoscopy;;   [place], with 1 ovary removed and 2 nd ovary removed 2003   TOTAL HIP ARTHROPLASTY Right    Original surgery 2006 with revision 2010   TOTAL HIP REVISION Right 01/01/2016   Procedure: TOTAL HIP REVISION;  Surgeon: Paralee Cancel, MD;  [facility];  Service: Orthopedics;  Laterality: Right;   TOTAL HIP REVISION Right 03/02/2016   Procedure: TOTAL HIP REVISION;  Surgeon: Paralee Cancel, MD;  [facility];  Service: Orthopedics;  Laterality: Right;   TOTAL HIP REVISION Right 09/02/2016   Procedure: Right hip constrained liner- posterior;  Surgeon: Paralee Cancel, MD;  [facility];  Service: Orthopedics;  Laterality: Right;   ULNAR NERVE TRANSPOSITION  Right    UPPER ESOPHAGEAL ENDOSCOPIC ULTRASOUND (EUS) N/A 10/19/2020   Procedure: UPPER ESOPHAGEAL ENDOSCOPIC ULTRASOUND (EUS);  Surgeon: Milus Banister, MD;  Location: [facility];  Service: Endoscopy;  Laterality: N/A;  periduodenal lesion    SOCIAL HISTORY: Social History   Socioeconomic History   Marital status: Married    Spouse name: Not on file   Number of children: 2   Years of education: Not on file   Highest education level: Not on file  Occupational History   Occupation: disabled   Occupation: disabled  Tobacco Use   Smoking status: Every Day    Packs/day: 2.00    Years: 46.00    Total pack  years: 92.00    Types: Cigarettes   Smokeless tobacco: Never   Tobacco comments:    2 packs of cigarettes smoked daily ARJ 04/10/21  Vaping Use   Vaping Use: Never used  Substance and Sexual Activity   Alcohol use: No   Drug use: No   Sexual activity: Not Currently    Partners: Male  Other Topics Concern   Not on file  Social History Narrative   Right handed    Caffeine~ 2 cups per day    Lives at home with husband (strained relationship)   Primary caretaker for disabled brother who had aneurism   Daughter died 30-Jun-2018    Social Determinants of Health   Financial Resource Strain: Medium Risk (05/03/2021)   Overall Financial Resource Strain (CARDIA)    Difficulty of Paying Living Expenses: Somewhat hard  Food Insecurity: No Food Insecurity (05/03/2021)   Hunger Vital Sign    Worried About Running Out of Food in the Last Year: Never true    Ran Out of Food in the Last Year: Never true  Transportation Needs: No Transportation Needs (12/27/2019)   PRAPARE - Hydrologist (Medical): No    Lack of Transportation (Non-Medical): No  Physical Activity: Inactive (05/03/2021)   Exercise Vital Sign    Days of Exercise per Week: 0 days    Minutes of Exercise per Session: 0 min  Stress: Stress Concern Present (05/03/2021)   [place]    Feeling of Stress : To some extent  Social Connections: Moderately Integrated (05/03/2021)   Social Connection and Isolation Panel [NHANES]    Frequency of Communication with Friends and Family: More than three times a week    Frequency of Social Gatherings with Friends and Family: Once a week    Attends Religious Services: 1 to 4 times per year    Active Member of [employer] or Organizations: No    Attends Archivist Meetings: Never    Marital Status: Married  Human resources officer Violence: Not At Risk (05/03/2021)   Humiliation, Afraid, Rape, and Kick questionnaire     Fear of Current or Ex-Partner: No    Emotionally Abused: No    Physically Abused: No    Sexually Abused: No    FAMILY HISTORY: Family History  Problem Relation Age of Onset   COPD Mother    Heart disease Mother    Lung disease Father        Asbestosis   Heart attack Father    Heart disease Father    Cerebral aneurysm Brother    Aneurysm Brother        Brain   Drug abuse Daughter    Epilepsy Son    Alcohol abuse Son  Drug abuse Son    Arthritis Maternal Grandmother    Heart disease Maternal Grandmother    Asthma Maternal Grandfather    Cancer Maternal Grandfather    Arthritis Paternal Grandmother    Heart disease Paternal Grandmother    Stroke Paternal Grandmother    Early death Paternal Grandfather    Heart disease Paternal Grandfather     ALLERGIES:  is allergic to metformin and related, nsaids, wellbutrin [bupropion], aleve [naproxen sodium], codeine, penicillins, and sulfonamide derivatives.  MEDICATIONS:  Current Outpatient Medications  Medication Sig Dispense Refill   mirtazapine (REMERON) 7.5 MG tablet Take 1 tablet (7.5 mg total) by mouth at bedtime. 30 tablet 2   albuterol (PROAIR HFA) 108 (90 Base) MCG/ACT inhaler 2 puffs every 4 hours as needed only  if your can't catch your breath 18 g 3   ALPRAZolam (XANAX) 1 MG tablet Take 1 tablet po TID as needed for anxiety. 90 tablet 2   atorvastatin (LIPITOR) 20 MG tablet TAKE 1 TABLET BY MOUTH AT BEDTIME 90 tablet 1   benzonatate (TESSALON) 100 MG capsule TAKE 1 CAPSULE BY MOUTH THREE TIMES DAILY 30 capsule 1   Budeson-Glycopyrrol-Formoterol (BREZTRI AEROSPHERE) 160-9-4.8 MCG/ACT AERO Inhale 2 puffs into the lungs in the morning and at bedtime. (Patient not taking: Reported on 06/27/2021) 10.7 g 3   cyclobenzaprine (FLEXERIL) 10 MG tablet Take 1 tablet (10 mg total) by mouth 3 (three) times daily as needed for muscle spasms. 90 tablet 5   DULoxetine (CYMBALTA) 60 MG capsule TAKE TWO CAPSULES BY MOUTH DAILY 60  capsule 5   fluticasone (FLONASE) 50 MCG/ACT nasal spray Place 2 sprays into both nostrils daily. 16 g 6   fluticasone-salmeterol (ADVAIR DISKUS) 250-50 MCG/ACT AEPB Inhale 1 puff into the lungs in the morning and at bedtime. 60 each 5   furosemide (LASIX) 20 MG tablet TAKE 1 TABLET BY MOUTH EVERY DAY AS NEEDED 30 tablet 90   HYDROcodone-acetaminophen (NORCO) 10-325 MG tablet Take 1 tablet by mouth every 4 (four) hours as needed (max 5 tabs/day). 150 tablet 0   levothyroxine (SYNTHROID) 137 MCG tablet Take 1 tablet (137 mcg total) by mouth daily before breakfast. 30 tablet 5   loratadine (CLARITIN) 10 MG tablet Take 10 mg by mouth daily.     methadone (DOLOPHINE) 5 MG tablet Take 0.5 tablets (2.5 mg total) by mouth every 12 (twelve) hours. 30 tablet 0   OLANZapine (ZYPREXA) 10 MG tablet Take 0.5 tablets (5 mg total) by mouth at bedtime. 30 tablet 2   omeprazole (PRILOSEC) 40 MG capsule TAKE ONE CAPSULE BY MOUTH TWICE DAILY 60 capsule 2   ondansetron (ZOFRAN) 8 MG tablet TAKE 2 TABLETS BY MOUTH TWICE DAILY AS NEEDED (Patient taking differently: 8 mg 2 (two) times daily.) 60 tablet 0   OXYGEN Inhale 3 L into the lungs at bedtime. Uses At Night     potassium chloride (KLOR-CON M) 10 MEQ tablet TAKE 1 TABLET BY MOUTH AT BEDTIME 90 tablet 1   prochlorperazine (COMPAZINE) 10 MG tablet Take 1 tablet (10 mg total) by mouth every 6 (six) hours as needed for nausea or vomiting. 30 tablet 0   triamterene-hydrochlorothiazide (MAXZIDE-25) 37.5-25 MG tablet TAKE 1 TABLET BY MOUTH DAILY - EMERGENCY REFILL FAXED DR. 90 tablet 2   No current facility-administered medications for this visit.   Facility-Administered Medications Ordered in Other Visits  Medication Dose Route Frequency Provider Last Rate Last Admin   atezolizumab (TECENTRIQ) 1,200 mg in sodium chloride 0.9 % 250  mL chemo infusion  1,200 mg Intravenous Once Ledell Peoples IV, MD 540 mL/hr at 07/18/21 1609 1,200 mg at 07/18/21 1609   heparin lock  flush 100 unit/mL  500 Units Intracatheter Once PRN Ledell Peoples IV, MD       sodium chloride flush (NS) 0.9 % injection 10 mL  10 mL Intracatheter PRN Orson Slick, MD        REVIEW OF SYSTEMS:   Constitutional: ( - ) fevers, ( - )  chills , ( - ) night sweats Eyes: ( - ) blurriness of vision, ( - ) double vision, ( - ) watery eyes Ears, nose, mouth, throat, and face: ( - ) mucositis, ( - ) sore throat Respiratory: ( - ) cough, ( +) dyspnea, ( - ) wheezes Cardiovascular: ( - ) palpitation, ( - ) chest discomfort, ( - ) lower extremity swelling Gastrointestinal:  ( -) nausea, ( - ) heartburn, ( - ) change in bowel habits Skin: ( - ) abnormal skin rashes Lymphatics: ( - ) new lymphadenopathy, ( - ) easy bruising Neurological: (+ ) numbness, ( - ) tingling, ( - ) new weaknesses Behavioral/Psych: ( - ) mood change, ( - ) new changes  All other systems were reviewed with the patient and are negative.  PHYSICAL EXAMINATION: ECOG PERFORMANCE STATUS: 1 - Symptomatic but completely ambulatory  Vitals:   07/18/21 1423  BP: 133/80  Pulse: 87  Resp: 16  Temp: 97.6 F (36.4 C)  SpO2: 91%   Filed Weights   07/18/21 1423  Weight: 200 lb 14.4 oz (91.1 kg)    GENERAL: Well-appearing middle-age Caucasian female, alert, no distress and comfortable SKIN: skin color, texture, turgor are normal, no rashes or significant lesions EYES: conjunctiva are pink and non-injected, sclera clear LUNGS:  normal breathing effort. Diffuse wheezing hear on ausculation.  HEART: regular rate & rhythm and no murmurs and no lower extremity edema Musculoskeletal: no cyanosis of digits and no clubbing  PSYCH: alert & oriented x 3, fluent speech NEURO: no focal motor/sensory deficits  LABORATORY DATA:  I have reviewed the data as listed    Latest Ref Rng & Units 07/18/2021    1:56 PM 06/27/2021   12:45 PM 06/06/2021   12:44 PM  CBC  WBC 4.0 - 10.5 K/uL 8.0  8.8  5.9   Hemoglobin 12.0 - 15.0 g/dL 12.2   11.9  11.9   Hematocrit 36.0 - 46.0 % 38.5  36.8  38.7   Platelets 150 - 400 K/uL 172  181  159        Latest Ref Rng & Units 07/18/2021    1:56 PM 06/27/2021   12:45 PM 06/06/2021   12:44 PM  CMP  Glucose 70 - 99 mg/dL 97  123  176   BUN 8 - 23 mg/dL 12  10  9    Creatinine 0.44 - 1.00 mg/dL 0.89  0.93  0.81   Sodium 135 - 145 mmol/L 135  136  139   Potassium 3.5 - 5.1 mmol/L 3.6  3.6  3.4   Chloride 98 - 111 mmol/L 99  103  105   CO2 22 - 32 mmol/L 28  29  28    Calcium 8.9 - 10.3 mg/dL 8.3  8.7  8.3   Total Protein 6.5 - 8.1 g/dL 6.9  6.1  6.4   Total Bilirubin 0.3 - 1.2 mg/dL 0.6  0.4  0.3   Alkaline Phos 38 - 126 U/L  83  87  77   AST 15 - 41 U/L 16  15  15    ALT 0 - 44 U/L 12  11  11      No results found for: "MPROTEIN" Lab Results  Component Value Date   KPAFRELGTCHN 0.75 06/30/2014   LAMBDASER 3.78 (H) 06/30/2014   KAPLAMBRATIO 0.20 (L) 06/30/2014     RADIOGRAPHIC STUDIES: CT CHEST ABDOMEN PELVIS W CONTRAST  Result Date: 07/02/2021 CLINICAL DATA:  Small-cell lung cancer, assess treatment response. EXAM: CT CHEST, ABDOMEN, AND PELVIS WITH CONTRAST TECHNIQUE: Multidetector CT imaging of the chest, abdomen and pelvis was performed following the standard protocol during bolus administration of intravenous contrast. RADIATION DOSE REDUCTION: This exam was performed according to the departmental dose-optimization program which includes automated exposure control, adjustment of the mA and/or kV according to patient size and/or use of iterative reconstruction technique. CONTRAST:  173mL OMNIPAQUE IOHEXOL 300 MG/ML  SOLN COMPARISON:  04/19/2021. FINDINGS: CT CHEST FINDINGS Cardiovascular: The heart is normal in size and there is no pericardial effusion. Coronary artery calcifications are noted. There is atherosclerotic calcification of the aorta without evidence of aneurysm. The pulmonary trunk is normal in caliber. The distal tip of a right chest port terminates in the right atrium.  Mediastinum/Nodes: No mediastinal, hilar, or axillary lymphadenopathy by size criteria. Thyroid gland, trachea, and esophagus are within normal limits. Lungs/Pleura: Emphysematous changes are present in the lungs. No consolidation, effusion, or pneumothorax. No definite pulmonary nodule or mass. Musculoskeletal: No chest wall mass or suspicious bone lesions identified. CT ABDOMEN PELVIS FINDINGS Hepatobiliary: No focal liver abnormality is seen. Status post cholecystectomy. No biliary dilatation. Pancreas: Unremarkable. No pancreatic ductal dilatation or surrounding inflammatory changes. Spleen: Normal in size without focal abnormality. Adrenals/Urinary Tract: There is a stable left adrenal nodule measuring 1.2 cm, unchanged from previous exams. No adrenal nodule is seen on the right. No renal calculus or hydronephrosis. The kidneys enhance symmetrically. The visualized portion of the urinary bladder is within normal limits. Stomach/Bowel: Stomach is within normal limits. Appendix is surgically absent. No evidence of bowel wall thickening, distention, or inflammatory changes. No free air or pneumatosis. A moderate to large amount of retained stool is present in the colon. Scattered diverticula are noted along the colon without evidence of diverticulitis. Vascular/Lymphatic: Aortic atherosclerosis. Nonspecific prominent lymph nodes are present at the porta hepatis and gastrohepatic ligament measuring up to 1.1 cm. Reproductive: Status post hysterectomy. No adnexal masses. Other: No abdominopelvic ascites. Musculoskeletal: Degenerative changes are noted in the lumbar spine. No acute or suspicious osseous abnormality. Total hip arthroplasty changes are noted on the right. IMPRESSION: 1. No residual or recurrent pulmonary nodule or mass. 2. Emphysema. 3. Prominent lymph nodes at the porta hepatis and in the periaortic region on the left not significantly changed from prior exams. Attention on follow-up is recommended to  exclude the possibility metastatic disease. 4. Remaining chronic findings as described above. 5. Aortic atherosclerosis. Electronically Signed   By: Brett Fairy M.D.   On: 07/02/2021 02:43    ASSESSMENT & PLAN [name] 66 y.o. female with medical history significant for extensive stage small cell lung cancer who presents for a follow up visit.   After review of the labs, review of the records, and discussion with the patient the patients findings are most consistent with extensive stage small cell lung cancer with metastasis from the right lower lobe to the lymph nodes of the abdomen.  At this time we will pursue triple therapy with  carboplatin, etoposide, and atezolizumab.  After 4 cycles we will convert to maintenance atezolizumab alone.  We previously discussed the risks and benefits of this therapy and the patient was in agreement to proceed with this treatment.  The treatment of choice consist of carboplatin, etoposide, and atezolizumab.  The regimen consists of carboplatin AUC of 5 IV on day 1, etoposide 100 mg per metered squared IV on day 1, 2, and 3 and atezolizumab 1200 mg on day 1.  This continues for 21-day cycles.  After 4 cycles the patient proceeds with atezolizumab maintenance therapy alone.   #Small Cell Lung Cancer, Extensive Stage -- MRI of the brain shows no evidence of intracranial spread --Findings are currently consistent with metastatic small cell lung cancer with metastatic spread to the lymph nodes of the abdomen --Prognosis is poor with anticipated life span of less than 12 months -- Patient has extensive stage due to metastatic spread to lymph nodes in the abdomen --Plan to proceed with carboplatin, etoposide, and atezolizumab --will consider prophylactic cranial radiation after start of chemotherapy.  Plan: --today is Cycle 12 Day 1 of Atezolizumab maintenance --labs today were reviewed and adequate for treatment. Hgb 12.2, Plt 172. No intervention  needed. --CT scan q 3 months. Last scan in June 2023 showed continued excellent response to therapy. CT scan will be ordered before next visit --RTC in 3 weeks for next cycle of maintenance atezolizumab    #Supportive Care -- chemotherapy education complete -- port placed -- zofran 8mg  q8H PRN and compazine 10mg  PO q6H for nausea -- EMLA cream for port -- Will add mirtazapine in order to help with appetite/sleep  #Nausea: --Symptoms improved and well controlled with zofran  #Hypokalemia:  --currently taking potassium chloride 10 mEq at bedtime --potassium level is 3.6 today. No further intervention is needed.  #Neuropathy involving fingers/feet: --Patient currently takes Cymbalta   No orders of the defined types were placed in this encounter.  All questions were answered. The patient knows to call the clinic with any problems, questions or concerns.  I have spent a total of 30 minutes minutes of face-to-face and non-face-to-face time, preparing to see the patient, performing a medically appropriate examination, counseling and educating the patient, ordering medications/tests, documenting clinical information in the electronic health record, and care coordination.   Ledell Peoples, MD Department of Hematology/Oncology [place] at [facility] Phone: [phone] Pager: [phone] Email: [email or web address]    07/18/2021 4:30 PM
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
## 2021-07-18 NOTE — Patient Instructions (Signed)
[facility]  Discharge Instructions: Thank you for choosing [facility] to provide your oncology and hematology care.   If you have a lab appointment with the [facility], please go directly to the [facility] and check in at the registration area.   Wear comfortable clothing and clothing appropriate for easy access to any Portacath or PICC line.   We strive to give you quality time with your provider. You may need to reschedule your appointment if you arrive late (15 or more minutes).  Arriving late affects you and other patients whose appointments are after yours.  Also, if you miss three or more appointments without notifying the office, you may be dismissed from the clinic at the provider's discretion.      For prescription refill requests, have your pharmacy contact our office and allow 72 hours for refills to be completed.    Today you received the following chemotherapy and/or immunotherapy agents: Atezolizumab       To help prevent nausea and vomiting after your treatment, we encourage you to take your nausea medication as directed.  BELOW ARE SYMPTOMS THAT SHOULD BE REPORTED IMMEDIATELY: *FEVER GREATER THAN 100.4 F (38 C) OR HIGHER *CHILLS OR SWEATING *NAUSEA AND VOMITING THAT IS NOT CONTROLLED WITH YOUR NAUSEA MEDICATION *UNUSUAL SHORTNESS OF BREATH *UNUSUAL BRUISING OR BLEEDING *URINARY PROBLEMS (pain or burning when urinating, or frequent urination) *BOWEL PROBLEMS (unusual diarrhea, constipation, pain near the anus) TENDERNESS IN MOUTH AND THROAT WITH OR WITHOUT PRESENCE OF ULCERS (sore throat, sores in mouth, or a toothache) UNUSUAL RASH, SWELLING OR PAIN  UNUSUAL VAGINAL DISCHARGE OR ITCHING   Items with * indicate a potential emergency and should be followed up as soon as possible or go to the Emergency Department if any problems should occur.  Please show the CHEMOTHERAPY ALERT CARD or IMMUNOTHERAPY ALERT CARD at check-in  to the Emergency Department and triage nurse.  Should you have questions after your visit or need to cancel or reschedule your appointment, please contact [facility]  Dept: [phone]  and follow the prompts.  Office hours are 8:00 a.m. to 4:30 p.m. Monday - Friday. Please note that voicemails left after 4:00 p.m. may not be returned until the following business day.  We are closed weekends and major holidays. You have access to a nurse at all times for urgent questions. Please call the main number to the clinic Dept: [phone] and follow the prompts.   For any non-urgent questions, you may also contact your provider using MyChart. We now offer e-Visits for anyone 10 and older to request care online for non-urgent symptoms. For details visit [email or web address].   Also download the MyChart app! Go to the app store, search "MyChart", open the app, select [facility], and log in with your MyChart username and password.  Due to Covid, a mask is required upon entering the hospital/clinic. If you do not have a mask, one will be given to you upon arrival. For doctor visits, patients may have 1 support person aged 42 or older with them. For treatment visits, patients cannot have anyone with them due to current Covid guidelines and our immunocompromised population.

## 2021-07-19 ENCOUNTER — Other Ambulatory Visit: Payer: Self-pay | Admitting: Physical Medicine and Rehabilitation

## 2021-07-19 MED ORDER — METHADONE HCL 5 MG PO TABS: 2.5000 mg | ORAL_TABLET | Freq: Two times a day (BID) | ORAL | 0 refills | Status: DC

## 2021-07-23 ENCOUNTER — Encounter: Payer: Self-pay | Admitting: *Deleted

## 2021-07-24 ENCOUNTER — Other Ambulatory Visit: Payer: Self-pay | Admitting: Physical Medicine and Rehabilitation

## 2021-07-24 DIAGNOSIS — G894 Chronic pain syndrome: Secondary | ICD-10-CM

## 2021-07-25 MED ORDER — HYDROCODONE-ACETAMINOPHEN 10-325 MG PO TABS: 1.0000 | ORAL_TABLET | ORAL | 0 refills | Status: DC | PRN

## 2021-07-30 ENCOUNTER — Other Ambulatory Visit: Payer: Self-pay | Admitting: Physician Assistant

## 2021-07-30 DIAGNOSIS — E785 Hyperlipidemia, unspecified: Secondary | ICD-10-CM

## 2021-08-01 ENCOUNTER — Encounter: Payer: Self-pay | Admitting: Hematology and Oncology

## 2021-08-02 ENCOUNTER — Other Ambulatory Visit: Payer: Self-pay | Admitting: Physical Medicine and Rehabilitation

## 2021-08-02 ENCOUNTER — Other Ambulatory Visit: Payer: Self-pay | Admitting: Physician Assistant

## 2021-08-06 ENCOUNTER — Encounter: Payer: Self-pay | Admitting: Hematology and Oncology

## 2021-08-08 ENCOUNTER — Encounter: Payer: Self-pay | Admitting: Hematology and Oncology

## 2021-08-08 ENCOUNTER — Inpatient Hospital Stay: Payer: Medicare Other

## 2021-08-08 ENCOUNTER — Other Ambulatory Visit: Payer: Self-pay

## 2021-08-08 ENCOUNTER — Inpatient Hospital Stay: Payer: Medicare Other | Attending: Hematology and Oncology

## 2021-08-08 ENCOUNTER — Other Ambulatory Visit: Payer: Self-pay | Admitting: *Deleted

## 2021-08-08 ENCOUNTER — Inpatient Hospital Stay (HOSPITAL_BASED_OUTPATIENT_CLINIC_OR_DEPARTMENT_OTHER): Payer: Medicare Other | Admitting: Hematology and Oncology

## 2021-08-08 VITALS — BP 157/87 | HR 93 | Temp 98.0°F | Resp 16 | Wt 197.4 lb

## 2021-08-08 DIAGNOSIS — E785 Hyperlipidemia, unspecified: Secondary | ICD-10-CM | POA: Diagnosis not present

## 2021-08-08 DIAGNOSIS — E119 Type 2 diabetes mellitus without complications: Secondary | ICD-10-CM | POA: Insufficient documentation

## 2021-08-08 DIAGNOSIS — Z888 Allergy status to other drugs, medicaments and biological substances status: Secondary | ICD-10-CM | POA: Diagnosis not present

## 2021-08-08 DIAGNOSIS — Z885 Allergy status to narcotic agent status: Secondary | ICD-10-CM | POA: Diagnosis not present

## 2021-08-08 DIAGNOSIS — M419 Scoliosis, unspecified: Secondary | ICD-10-CM | POA: Diagnosis not present

## 2021-08-08 DIAGNOSIS — Z882 Allergy status to sulfonamides status: Secondary | ICD-10-CM | POA: Diagnosis not present

## 2021-08-08 DIAGNOSIS — Z823 Family history of stroke: Secondary | ICD-10-CM | POA: Insufficient documentation

## 2021-08-08 DIAGNOSIS — Z95828 Presence of other vascular implants and grafts: Secondary | ICD-10-CM

## 2021-08-08 DIAGNOSIS — F32A Depression, unspecified: Secondary | ICD-10-CM | POA: Diagnosis not present

## 2021-08-08 DIAGNOSIS — Z79890 Hormone replacement therapy (postmenopausal): Secondary | ICD-10-CM | POA: Diagnosis not present

## 2021-08-08 DIAGNOSIS — Z8261 Family history of arthritis: Secondary | ICD-10-CM | POA: Diagnosis not present

## 2021-08-08 DIAGNOSIS — Z8249 Family history of ischemic heart disease and other diseases of the circulatory system: Secondary | ICD-10-CM | POA: Insufficient documentation

## 2021-08-08 DIAGNOSIS — Z90721 Acquired absence of ovaries, unilateral: Secondary | ICD-10-CM | POA: Insufficient documentation

## 2021-08-08 DIAGNOSIS — C3431 Malignant neoplasm of lower lobe, right bronchus or lung: Secondary | ICD-10-CM | POA: Diagnosis not present

## 2021-08-08 DIAGNOSIS — C3490 Malignant neoplasm of unspecified part of unspecified bronchus or lung: Secondary | ICD-10-CM

## 2021-08-08 DIAGNOSIS — Z880 Allergy status to penicillin: Secondary | ICD-10-CM | POA: Diagnosis not present

## 2021-08-08 DIAGNOSIS — F419 Anxiety disorder, unspecified: Secondary | ICD-10-CM | POA: Diagnosis not present

## 2021-08-08 DIAGNOSIS — Z836 Family history of other diseases of the respiratory system: Secondary | ICD-10-CM | POA: Diagnosis not present

## 2021-08-08 DIAGNOSIS — Z814 Family history of other substance abuse and dependence: Secondary | ICD-10-CM | POA: Insufficient documentation

## 2021-08-08 DIAGNOSIS — J449 Chronic obstructive pulmonary disease, unspecified: Secondary | ICD-10-CM | POA: Insufficient documentation

## 2021-08-08 DIAGNOSIS — Z79899 Other long term (current) drug therapy: Secondary | ICD-10-CM | POA: Insufficient documentation

## 2021-08-08 DIAGNOSIS — F17210 Nicotine dependence, cigarettes, uncomplicated: Secondary | ICD-10-CM | POA: Diagnosis not present

## 2021-08-08 DIAGNOSIS — Z8719 Personal history of other diseases of the digestive system: Secondary | ICD-10-CM | POA: Diagnosis not present

## 2021-08-08 DIAGNOSIS — Z886 Allergy status to analgesic agent status: Secondary | ICD-10-CM | POA: Diagnosis not present

## 2021-08-08 DIAGNOSIS — Z809 Family history of malignant neoplasm, unspecified: Secondary | ICD-10-CM | POA: Insufficient documentation

## 2021-08-08 DIAGNOSIS — R110 Nausea: Secondary | ICD-10-CM | POA: Insufficient documentation

## 2021-08-08 DIAGNOSIS — Z9049 Acquired absence of other specified parts of digestive tract: Secondary | ICD-10-CM | POA: Insufficient documentation

## 2021-08-08 DIAGNOSIS — Z811 Family history of alcohol abuse and dependence: Secondary | ICD-10-CM | POA: Insufficient documentation

## 2021-08-08 DIAGNOSIS — Z5112 Encounter for antineoplastic immunotherapy: Secondary | ICD-10-CM | POA: Insufficient documentation

## 2021-08-08 DIAGNOSIS — Z820 Family history of epilepsy and other diseases of the nervous system: Secondary | ICD-10-CM | POA: Insufficient documentation

## 2021-08-08 LAB — CBC WITH DIFFERENTIAL (CANCER CENTER ONLY)
Abs Immature Granulocytes: 0.02 10*3/uL (ref 0.00–0.07)
Basophils Absolute: 0 10*3/uL (ref 0.0–0.1)
Basophils Relative: 0 %
Eosinophils Absolute: 0.4 10*3/uL (ref 0.0–0.5)
Eosinophils Relative: 4 %
HCT: 40 % (ref 36.0–46.0)
Hemoglobin: 12.6 g/dL (ref 12.0–15.0)
Immature Granulocytes: 0 %
Lymphocytes Relative: 28 %
Lymphs Abs: 2.5 10*3/uL (ref 0.7–4.0)
MCH: 26.6 pg (ref 26.0–34.0)
MCHC: 31.5 g/dL (ref 30.0–36.0)
MCV: 84.6 fL (ref 80.0–100.0)
Monocytes Absolute: 0.8 10*3/uL (ref 0.1–1.0)
Monocytes Relative: 9 %
Neutro Abs: 5.4 10*3/uL (ref 1.7–7.7)
Neutrophils Relative %: 59 %
Platelet Count: 171 10*3/uL (ref 150–400)
RBC: 4.73 MIL/uL (ref 3.87–5.11)
RDW: 20.6 % — ABNORMAL HIGH (ref 11.5–15.5)
WBC Count: 9.1 10*3/uL (ref 4.0–10.5)
nRBC: 0 % (ref 0.0–0.2)

## 2021-08-08 LAB — CMP (CANCER CENTER ONLY)
ALT: 8 U/L (ref 0–44)
AST: 13 U/L — ABNORMAL LOW (ref 15–41)
Albumin: 3.6 g/dL (ref 3.5–5.0)
Alkaline Phosphatase: 78 U/L (ref 38–126)
Anion gap: 5 (ref 5–15)
BUN: 9 mg/dL (ref 8–23)
CO2: 29 mmol/L (ref 22–32)
Calcium: 8.9 mg/dL (ref 8.9–10.3)
Chloride: 105 mmol/L (ref 98–111)
Creatinine: 0.88 mg/dL (ref 0.44–1.00)
GFR, Estimated: >60 mL/min (ref >60)
Glucose, Bld: 107 mg/dL — ABNORMAL HIGH (ref 70–99)
Potassium: 3.8 mmol/L (ref 3.5–5.1)
Sodium: 139 mmol/L (ref 135–145)
Total Bilirubin: 0.5 mg/dL (ref 0.3–1.2)
Total Protein: 6.5 g/dL (ref 6.5–8.1)

## 2021-08-08 LAB — TSH: TSH: 1.375 u[IU]/mL (ref 0.350–4.500)

## 2021-08-08 MED ORDER — SODIUM CHLORIDE 0.9% FLUSH
10.0000 mL | INTRAVENOUS | Status: DC | PRN
  Administered 2021-08-08: 10 mL

## 2021-08-08 MED ORDER — SODIUM CHLORIDE 0.9 % IV SOLN
1200.0000 mg | Freq: Once | INTRAVENOUS | Status: AC
  Administered 2021-08-08: 1200 mg via INTRAVENOUS
  Filled 2021-08-08: qty 20

## 2021-08-08 MED ORDER — OLANZAPINE 10 MG PO TABS: 10.0000 mg | ORAL_TABLET | Freq: Every day | ORAL | 2 refills | Status: DC

## 2021-08-08 MED ORDER — HEPARIN SOD (PORK) LOCK FLUSH 100 UNIT/ML IV SOLN
500.0000 [IU] | Freq: Once | INTRAVENOUS | Status: AC | PRN
  Administered 2021-08-08: 500 [IU]

## 2021-08-08 MED ORDER — SODIUM CHLORIDE 0.9% FLUSH
10.0000 mL | Freq: Once | INTRAVENOUS | Status: AC
  Administered 2021-08-08: 10 mL

## 2021-08-08 MED ORDER — SODIUM CHLORIDE 0.9 % IV SOLN: Freq: Once | INTRAVENOUS | Status: AC

## 2021-08-08 NOTE — Progress Notes (Signed)
[facility] [phone]   [phone]  PROGRESS NOTE  Patient Care Team: Allwardt, Randa Evens, PA-C as PCP - General (Physician Assistant) Milus Banister, MD as Attending Physician (Gastroenterology) Dr. Darylene Price, MD as Consulting Physician (Orthopedic Surgery) Latanya Maudlin, MD as Consulting Physician (Orthopedic Surgery) Kathrynn Ducking, MD (Inactive) as Consulting Physician (Neurology) Okey Regal, [place] as Consulting Physician (Optometry) Delice Lesch Lezlie Octave, MD as Consulting Physician (Neurology) Leona Singleton, RN as [facility] (Pharmacist)  Hematological/Oncological History # Small Cell Lung Cancer, Extensive Stage 07/05/2020: CT abdomen for lower abdominal pain. New pulmonary nodular density noted 07/06/2020: CT chest showed 2.2 cm macrolobulated right lower lobe pulmonary nodule (favored) versus pathologically enlarged infrahilar lymph node 10/13/2020: PET CT scan performed, findings show 2 cm right lower lobe lung mass is hypermetabolic and consistent with primary lung neoplasm. Additionally found hypermetabolic 17 mm soft tissue lesion between the descending duodenum and the pancreatic head  10/19/2020: EGD to biopsy hypermetabolic lymph node. Biopsy results show small cell lung cancer 10/26/2020: establish care with Dr. Lorenso Courier  11/13/2020: Cycle 1 Day 1 of Carbo/Etop/Atezolizumab 12/04/2020: Cycle 2 Day 1 of Carbo/Etop/Atezolizumab 11/22-11/25/2022: admitted for E. Coli bacteremia/sepsis. Start of Cycle 3 delayed. 01/02/2021: Cycle 3 Day 1 of Carbo/Etop/Atezolizumab 01/23/2021: Cycle 4 Day 1 of Carbo/Etop/Atezolizumab 02/22/2021: Cycle 5 Day 1 of Atezolizumab Maintenance. Delayed start due to patient's COVID infection.  03/14/2021: Cycle 6 Day 1 of Atezolizumab Maintenance 04/05/2021: Cycle 7 Day 1 of Atezolizumab Maintenance 04/25/2021: Cycle 8 Day 1 of Atezolizumab  Maintenance 05/16/2021: Cycle 9 Day 1 of Atezolizumab Maintenance 06/06/2021: Cycle 10 Day 1 of Atezolizumab Maintenance 06/27/2021:  Cycle 11 Day 1 of Atezolizumab Maintenance 07/18/2021: Cycle 12 Day 1 of Atezolizumab Maintenance 08/08/2021: Cycle 13 Day 1 of Atezolizumab Maintenance  Interval History:  [name] 66 y.o. female with medical history significant for extensive stage small cell lung cancer who presents for a follow up visit. The patient's last visit was on 07/18/2021. In the interim since the last visit she has completed cycle 12 of chemotherapy. She presents today to start cycle 13 of chemotherapy.  On exam today [name] reports she has been well overall in the interim since her last visit.  Her primary symptom has been fatigue that she does occasionally have issues with nausea.  She has been taking Zofran and recently increased her olanzapine to 10 mg p.o. daily instead of the usual 5.  She reports that she is sleeping much better and not struggling with as much nausea on this new dosage.  She reports that she is breathing well and "eating good".  Although her weight has dropped 3 pounds.  She is unsure why her weight continues to decline.  Overall she is willing and able to proceed with treatment at this time.  She is delighted that her hair is growing back.  She denies fevers, chills, sweats, chest pain or peripheral edema.  She has no other complaints. A full 10 point ROS is listed below.   MEDICAL HISTORY:  Past Medical History:  Diagnosis Date   Allergic rhinitis    Anemia    Anxiety    Chicken pox    Chronic back pain    COPD (chronic obstructive pulmonary disease) (HCC)    Depression    DM (diabetes mellitus) ([place])    Essential hypertension    GERD (gastroesophageal reflux disease)    Headache    migraines  History of gastritis    EGD 2015   History of home oxygen therapy    2 liters at hs last 6 months   Hyperlipidemia    Hypothyroidism    Migraines     Osteoarthritis    oa   Scoliosis     SURGICAL HISTORY: Past Surgical History:  Procedure Laterality Date   APPENDECTOMY     1985   BIOPSY  07/24/2017   Procedure: BIOPSY;  Surgeon: Milus Banister, MD;  Location: [facility];  Service: Endoscopy;;   CARDIAC CATHETERIZATION N/A 10/31/2015   Procedure: Left Heart Cath and Coronary Angiography;  Surgeon: Leonie Man, MD;  Location: [facility];  Service: Cardiovascular;  Laterality: N/A;   CARPAL TUNNEL RELEASE Left    CARPAL TUNNEL RELEASE Right    CHOLECYSTECTOMY  late 1980's   COLONOSCOPY WITH PROPOFOL N/A 07/24/2017   Procedure: COLONOSCOPY WITH PROPOFOL;  Surgeon: Milus Banister, MD;  [facility]: [facility];  Service: Endoscopy;  Laterality: N/A;   ESOPHAGOGASTRODUODENOSCOPY N/A 07/24/2017   Procedure: ESOPHAGOGASTRODUODENOSCOPY (EGD);  Surgeon: Milus Banister, MD;  Location: [facility];  Service: Endoscopy;  Laterality: N/A;   ESOPHAGOGASTRODUODENOSCOPY (EGD) WITH PROPOFOL N/A 10/19/2020   Procedure: ESOPHAGOGASTRODUODENOSCOPY (EGD) WITH PROPOFOL;  Surgeon: Milus Banister, MD;  [facility];  Service: Endoscopy;  Laterality: N/A;   FINE NEEDLE ASPIRATION N/A 10/19/2020   Procedure: FINE NEEDLE ASPIRATION (FNA) LINEAR;  Surgeon: Milus Banister, MD;  [facility];  Service: Endoscopy;  Laterality: N/A;   GALLBLADDER SURGERY  1991   HIP CLOSED REDUCTION Right 01/08/2016   Procedure: CLOSED MANIPULATION HIP;  Surgeon: Susa Day, MD;  [facility]: [facility];  Service: Orthopedics;  Laterality: Right;   HIP CLOSED REDUCTION Right 01/19/2016   Procedure: ATTEMPTED CLOSED REDUCTION RIGHT HIP;  Surgeon: Wylene Simmer, MD;  Location: [facility];  Service: Orthopedics;  Laterality: Right;   HIP CLOSED REDUCTION Right 01/20/2016   Procedure: CLOSED REDUCTION RIGHT TOTAL HIP;  Surgeon: Paralee Cancel, MD;  Location: [facility];  Service: Orthopedics;  Laterality: Right;   HIP CLOSED REDUCTION Right 02/17/2016   Procedure:  CLOSED REDUCTION RIGHT TOTAL HIP;  Surgeon: Rod Can, MD;  Location: [facility];  Service: Orthopedics;  Laterality: Right;   HIP CLOSED REDUCTION Right 02/28/2016   Procedure: CLOSED REDUCTION HIP;  Surgeon: Nicholes Stairs, MD;  [facility]: [facility];  Service: Orthopedics;  Laterality: Right;   IR IMAGING GUIDED PORT INSERTION  11/01/2020   POLYPECTOMY  07/24/2017   Procedure: POLYPECTOMY;  Surgeon: Milus Banister, MD;  [facility];  Service: Endoscopy;;   [place], with 1 ovary removed and 2 nd ovary removed 2003   TOTAL HIP ARTHROPLASTY Right    Original surgery 2006 with revision 2010   TOTAL HIP REVISION Right 01/01/2016   Procedure: TOTAL HIP REVISION;  Surgeon: Paralee Cancel, MD;  [facility];  Service: Orthopedics;  Laterality: Right;   TOTAL HIP REVISION Right 03/02/2016   Procedure: TOTAL HIP REVISION;  Surgeon: Paralee Cancel, MD;  [facility];  Service: Orthopedics;  Laterality: Right;   TOTAL HIP REVISION Right 09/02/2016   Procedure: Right hip constrained liner- posterior;  Surgeon: Paralee Cancel, MD;  [facility];  Service: Orthopedics;  Laterality: Right;   ULNAR NERVE TRANSPOSITION Right    UPPER ESOPHAGEAL ENDOSCOPIC ULTRASOUND (EUS) N/A 10/19/2020   Procedure: UPPER ESOPHAGEAL ENDOSCOPIC ULTRASOUND (EUS);  Surgeon: Owens Loffler  P, MD;  [facility];  Service: Endoscopy;  Laterality: N/A;  periduodenal lesion    SOCIAL HISTORY: Social History   Socioeconomic History   Marital status: Married    Spouse name: Not on file   Number of children: 2   Years of education: Not on file   Highest education level: Not on file  Occupational History   Occupation: disabled   Occupation: disabled  Tobacco Use   Smoking status: Every Day    Packs/day: 2.00    Years: 46.00    Total pack years: 92.00    Types: Cigarettes   Smokeless tobacco: Never   Tobacco comments:    2 packs of cigarettes smoked  daily ARJ 04/10/21  Vaping Use   Vaping Use: Never used  Substance and Sexual Activity   Alcohol use: No   Drug use: No   Sexual activity: Not Currently    Partners: Male  Other Topics Concern   Not on file  Social History Narrative   Right handed    Caffeine~ 2 cups per day    Lives at home with husband (strained relationship)   Primary caretaker for disabled brother who had aneurism   Daughter died 06-14-18    Social Determinants of Health   Financial Resource Strain: Medium Risk (05/03/2021)   Overall Financial Resource Strain (CARDIA)    Difficulty of Paying Living Expenses: Somewhat hard  Food Insecurity: No Food Insecurity (05/03/2021)   Hunger Vital Sign    Worried About Running Out of Food in the Last Year: Never true    Ran Out of Food in the Last Year: Never true  Transportation Needs: No Transportation Needs (12/27/2019)   PRAPARE - Hydrologist (Medical): No    Lack of Transportation (Non-Medical): No  Physical Activity: Inactive (05/03/2021)   Exercise Vital Sign    Days of Exercise per Week: 0 days    Minutes of Exercise per Session: 0 min  Stress: Stress Concern Present (05/03/2021)   [place]    Feeling of Stress : To some extent  Social Connections: Moderately Integrated (05/03/2021)   Social Connection and Isolation Panel [NHANES]    Frequency of Communication with Friends and Family: More than three times a week    Frequency of Social Gatherings with Friends and Family: Once a week    Attends Religious Services: 1 to 4 times per year    Active Member of [employer] or Organizations: No    Attends Archivist Meetings: Never    Marital Status: Married  Human resources officer Violence: Not At Risk (05/03/2021)   Humiliation, Afraid, Rape, and Kick questionnaire    Fear of Current or Ex-Partner: No    Emotionally Abused: No    Physically Abused: No    Sexually Abused: No     FAMILY HISTORY: Family History  Problem Relation Age of Onset   COPD Mother    Heart disease Mother    Lung disease Father        Asbestosis   Heart attack Father    Heart disease Father    Cerebral aneurysm Brother    Aneurysm Brother        Brain   Drug abuse Daughter    Epilepsy Son    Alcohol abuse Son    Drug abuse Son    Arthritis Maternal Grandmother    Heart disease Maternal Grandmother    Asthma Maternal Grandfather  Cancer Maternal Grandfather    Arthritis Paternal Grandmother    Heart disease Paternal Grandmother    Stroke Paternal Grandmother    Early death Paternal Grandfather    Heart disease Paternal Grandfather     ALLERGIES:  is allergic to metformin and related, nsaids, wellbutrin [bupropion], aleve [naproxen sodium], codeine, penicillins, and sulfonamide derivatives.  MEDICATIONS:  Current Outpatient Medications  Medication Sig Dispense Refill   albuterol (PROAIR HFA) 108 (90 Base) MCG/ACT inhaler 2 puffs every 4 hours as needed only  if your can't catch your breath 18 g 3   ALPRAZolam (XANAX) 1 MG tablet Take 1 tablet po TID as needed for anxiety. 90 tablet 2   atorvastatin (LIPITOR) 20 MG tablet TAKE 1 TABLET BY MOUTH AT BEDTIME 90 tablet 1   benzonatate (TESSALON) 100 MG capsule TAKE 1 CAPSULE BY MOUTH THREE TIMES DAILY 30 capsule 1   Budeson-Glycopyrrol-Formoterol (BREZTRI AEROSPHERE) 160-9-4.8 MCG/ACT AERO Inhale 2 puffs into the lungs in the morning and at bedtime. (Patient not taking: Reported on 06/27/2021) 10.7 g 3   cyclobenzaprine (FLEXERIL) 10 MG tablet Take 1 tablet (10 mg total) by mouth 3 (three) times daily as needed for muscle spasms. 90 tablet 5   DULoxetine (CYMBALTA) 60 MG capsule TAKE TWO CAPSULES BY MOUTH DAILY 60 capsule 5   fluticasone (FLONASE) 50 MCG/ACT nasal spray INSTILL 2 SPRAYS IN EACH NOSTRIL EVERY DAY 16 g 6   fluticasone-salmeterol (ADVAIR DISKUS) 250-50 MCG/ACT AEPB Inhale 1 puff into the lungs in the morning and at  bedtime. 60 each 5   furosemide (LASIX) 20 MG tablet TAKE 1 TABLET BY MOUTH EVERY DAY AS NEEDED 30 tablet 90   HYDROcodone-acetaminophen (NORCO) 10-325 MG tablet Take 1 tablet by mouth every 4 (four) hours as needed (max 5 tabs/day). 150 tablet 0   levothyroxine (SYNTHROID) 137 MCG tablet Take 1 tablet (137 mcg total) by mouth daily before breakfast. 30 tablet 5   loratadine (CLARITIN) 10 MG tablet Take 10 mg by mouth daily.     methadone (DOLOPHINE) 5 MG tablet Take 0.5 tablets (2.5 mg total) by mouth every 12 (twelve) hours. 30 tablet 0   OLANZapine (ZYPREXA) 10 MG tablet Take 1 tablet (10 mg total) by mouth at bedtime. 30 tablet 2   omeprazole (PRILOSEC) 40 MG capsule TAKE ONE CAPSULE BY MOUTH TWICE DAILY 60 capsule 2   ondansetron (ZOFRAN) 8 MG tablet TAKE 2 TABLETS BY MOUTH TWICE DAILY AS NEEDED (Patient taking differently: 8 mg 2 (two) times daily.) 60 tablet 0   OXYGEN Inhale 3 L into the lungs at bedtime. Uses At Night     potassium chloride (KLOR-CON M) 10 MEQ tablet TAKE 1 TABLET BY MOUTH AT BEDTIME 90 tablet 1   prochlorperazine (COMPAZINE) 10 MG tablet Take 1 tablet (10 mg total) by mouth every 6 (six) hours as needed for nausea or vomiting. 30 tablet 0   triamterene-hydrochlorothiazide (MAXZIDE-25) 37.5-25 MG tablet TAKE 1 TABLET BY MOUTH DAILY - EMERGENCY REFILL FAXED DR. 90 tablet 2   No current facility-administered medications for this visit.    REVIEW OF SYSTEMS:   Constitutional: ( - ) fevers, ( - )  chills , ( - ) night sweats Eyes: ( - ) blurriness of vision, ( - ) double vision, ( - ) watery eyes Ears, nose, mouth, throat, and face: ( - ) mucositis, ( - ) sore throat Respiratory: ( - ) cough, ( +) dyspnea, ( - ) wheezes Cardiovascular: ( - ) palpitation, ( - )  chest discomfort, ( - ) lower extremity swelling Gastrointestinal:  ( -) nausea, ( - ) heartburn, ( - ) change in bowel habits Skin: ( - ) abnormal skin rashes Lymphatics: ( - ) new lymphadenopathy, ( - ) easy  bruising Neurological: (+ ) numbness, ( - ) tingling, ( - ) new weaknesses Behavioral/Psych: ( - ) mood change, ( - ) new changes  All other systems were reviewed with the patient and are negative.  PHYSICAL EXAMINATION: ECOG PERFORMANCE STATUS: 1 - Symptomatic but completely ambulatory  Vitals:   08/08/21 1043  BP: (!) 157/87  Pulse: 93  Resp: 16  Temp: 98 F (36.7 C)  SpO2: 94%   Filed Weights   08/08/21 1043  Weight: 197 lb 6.4 oz (89.5 kg)    GENERAL: Well-appearing middle-age Caucasian female, alert, no distress and comfortable SKIN: skin color, texture, turgor are normal, no rashes or significant lesions EYES: conjunctiva are pink and non-injected, sclera clear LUNGS:  normal breathing effort. Diffuse wheezing hear on ausculation.  HEART: regular rate & rhythm and no murmurs and no lower extremity edema Musculoskeletal: no cyanosis of digits and no clubbing  PSYCH: alert & oriented x 3, fluent speech NEURO: no focal motor/sensory deficits  LABORATORY DATA:  I have reviewed the data as listed    Latest Ref Rng & Units 08/08/2021   10:14 AM 07/18/2021    1:56 PM 06/27/2021   12:45 PM  CBC  WBC 4.0 - 10.5 K/uL 9.1  8.0  8.8   Hemoglobin 12.0 - 15.0 g/dL 12.6  12.2  11.9   Hematocrit 36.0 - 46.0 % 40.0  38.5  36.8   Platelets 150 - 400 K/uL 171  172  181        Latest Ref Rng & Units 08/08/2021   10:14 AM 07/18/2021    1:56 PM 06/27/2021   12:45 PM  CMP  Glucose 70 - 99 mg/dL 107  97  123   BUN 8 - 23 mg/dL 9  12  10    Creatinine 0.44 - 1.00 mg/dL 0.88  0.89  0.93   Sodium 135 - 145 mmol/L 139  135  136   Potassium 3.5 - 5.1 mmol/L 3.8  3.6  3.6   Chloride 98 - 111 mmol/L 105  99  103   CO2 22 - 32 mmol/L 29  28  29    Calcium 8.9 - 10.3 mg/dL 8.9  8.3  8.7   Total Protein 6.5 - 8.1 g/dL 6.5  6.9  6.1   Total Bilirubin 0.3 - 1.2 mg/dL 0.5  0.6  0.4   Alkaline Phos 38 - 126 U/L 78  83  87   AST 15 - 41 U/L 13  16  15    ALT 0 - 44 U/L 8  12  11      No  results found for: "MPROTEIN" Lab Results  Component Value Date   KPAFRELGTCHN 0.75 06/30/2014   LAMBDASER 3.78 (H) 06/30/2014   KAPLAMBRATIO 0.20 (L) 06/30/2014     RADIOGRAPHIC STUDIES: No results found.  ASSESSMENT & PLAN [name] 66 y.o. female with medical history significant for extensive stage small cell lung cancer who presents for a follow up visit.   After review of the labs, review of the records, and discussion with the patient the patients findings are most consistent with extensive stage small cell lung cancer with metastasis from the right lower lobe to the lymph nodes of the abdomen.  At this time  we will pursue triple therapy with carboplatin, etoposide, and atezolizumab.  After 4 cycles we will convert to maintenance atezolizumab alone.  We previously discussed the risks and benefits of this therapy and the patient was in agreement to proceed with this treatment.  The treatment of choice consist of carboplatin, etoposide, and atezolizumab.  The regimen consists of carboplatin AUC of 5 IV on day 1, etoposide 100 mg per metered squared IV on day 1, 2, and 3 and atezolizumab 1200 mg on day 1.  This continues for 21-day cycles.  After 4 cycles the patient proceeds with atezolizumab maintenance therapy alone.   #Small Cell Lung Cancer, Extensive Stage -- MRI of the brain shows no evidence of intracranial spread --Findings are currently consistent with metastatic small cell lung cancer with metastatic spread to the lymph nodes of the abdomen --Prognosis is poor with anticipated life span of less than 12 months -- Patient has extensive stage due to metastatic spread to lymph nodes in the abdomen --Plan to proceed with carboplatin, etoposide, and atezolizumab --will consider prophylactic cranial radiation after start of chemotherapy.  Plan: --today is Cycle 13 Day 1 of Atezolizumab maintenance --labs today were reviewed and adequate for treatment. Hgb 12.6, white blood  cell count 9.1, and platelets of 171. No intervention needed. --CT scan q 3 months. Last scan in June 2023 showed continued excellent response to therapy. CT scan next in Sept 2023.  --RTC in 3 weeks for next cycle of maintenance atezolizumab    #Supportive Care -- chemotherapy education complete -- port placed -- zofran 8mg  q8H PRN and compazine 10mg  PO q6H for nausea -- EMLA cream for port -- Will add mirtazapine in order to help with appetite/sleep  #Nausea: --Symptoms improved and well controlled with zofran  #Hypokalemia:  --currently taking potassium chloride 10 mEq at bedtime --potassium level is 3.8 today. No further intervention is needed.  #Neuropathy involving fingers/feet: --Patient currently takes Cymbalta   No orders of the defined types were placed in this encounter.  All questions were answered. The patient knows to call the clinic with any problems, questions or concerns.  I have spent a total of 30 minutes minutes of face-to-face and non-face-to-face time, preparing to see the patient, performing a medically appropriate examination, counseling and educating the patient, ordering medications/tests, documenting clinical information in the electronic health record, and care coordination.   Ledell Peoples, MD Department of Hematology/Oncology [place] [facility] Phone: [phone] Pager: [phone] Email: [email or web address]    08/08/2021 11:28 AM

## 2021-08-08 NOTE — Patient Instructions (Signed)
[facility]  Discharge Instructions: Thank you for choosing [facility] to provide your oncology and hematology care.   If you have a lab appointment with the [facility], please go directly to the [facility] and check in at the registration area.   Wear comfortable clothing and clothing appropriate for easy access to any Portacath or PICC line.   We strive to give you quality time with your provider. You may need to reschedule your appointment if you arrive late (15 or more minutes).  Arriving late affects you and other patients whose appointments are after yours.  Also, if you miss three or more appointments without notifying the office, you may be dismissed from the clinic at the provider's discretion.      For prescription refill requests, have your pharmacy contact our office and allow 72 hours for refills to be completed.    Today you received the following chemotherapy and/or immunotherapy agents tecentric      To help prevent nausea and vomiting after your treatment, we encourage you to take your nausea medication as directed.  BELOW ARE SYMPTOMS THAT SHOULD BE REPORTED IMMEDIATELY: *FEVER GREATER THAN 100.4 F (38 C) OR HIGHER *CHILLS OR SWEATING *NAUSEA AND VOMITING THAT IS NOT CONTROLLED WITH YOUR NAUSEA MEDICATION *UNUSUAL SHORTNESS OF BREATH *UNUSUAL BRUISING OR BLEEDING *URINARY PROBLEMS (pain or burning when urinating, or frequent urination) *BOWEL PROBLEMS (unusual diarrhea, constipation, pain near the anus) TENDERNESS IN MOUTH AND THROAT WITH OR WITHOUT PRESENCE OF ULCERS (sore throat, sores in mouth, or a toothache) UNUSUAL RASH, SWELLING OR PAIN  UNUSUAL VAGINAL DISCHARGE OR ITCHING   Items with * indicate a potential emergency and should be followed up as soon as possible or go to the Emergency Department if any problems should occur.  Please show the CHEMOTHERAPY ALERT CARD or IMMUNOTHERAPY ALERT CARD at check-in to  the Emergency Department and triage nurse.  Should you have questions after your visit or need to cancel or reschedule your appointment, please contact [facility]  Dept: [phone]  and follow the prompts.  Office hours are 8:00 a.m. to 4:30 p.m. Monday - Friday. Please note that voicemails left after 4:00 p.m. may not be returned until the following business day.  We are closed weekends and major holidays. You have access to a nurse at all times for urgent questions. Please call the main number to the clinic Dept: [phone] and follow the prompts.   For any non-urgent questions, you may also contact your provider using MyChart. We now offer e-Visits for anyone 57 and older to request care online for non-urgent symptoms. For details visit [email or web address].   Also download the MyChart app! Go to the app store, search "MyChart", open the app, select [facility], and log in with your MyChart username and password.  Masks are optional in the cancer centers. If you would like for your care team to wear a mask while they are taking care of you, please let them know. For doctor visits, patients may have with them one support person who is at least 65 years old. At this time, visitors are not allowed in the infusion area.

## 2021-08-09 ENCOUNTER — Encounter: Payer: Self-pay | Admitting: Hematology and Oncology

## 2021-08-09 ENCOUNTER — Telehealth: Payer: Self-pay | Admitting: Hematology and Oncology

## 2021-08-09 NOTE — Telephone Encounter (Signed)
Scheduled per 7/12 los, pt has been called and confirmed

## 2021-08-10 ENCOUNTER — Encounter: Payer: Self-pay | Admitting: Hematology and Oncology

## 2021-08-15 ENCOUNTER — Telehealth: Payer: Self-pay | Admitting: Pharmacist

## 2021-08-15 NOTE — Progress Notes (Signed)
Chronic Care Management Pharmacy Assistant   Name: [name]  MRN: [id number] DOB: 07-03-1955  Reason for Encounter: General Adherence Call    Recent office visits:  None since last adherence call  Recent consult visits:  08/08/2021 OV (Oncology) Orson Slick, MD; no medication changes indicated.  07/18/2021 OV (Oncology) Orson Slick, MD; Will add mirtazapine in order to help with appetite/sleep  Hospital visits:  None in previous 6 months  Medications: Outpatient Encounter Medications as of 08/15/2021  Medication Sig Note   albuterol (PROAIR HFA) 108 (90 Base) MCG/ACT inhaler 2 puffs every 4 hours as needed only  if your can't catch your breath    ALPRAZolam (XANAX) 1 MG tablet Take 1 tablet po TID as needed for anxiety.    atorvastatin (LIPITOR) 20 MG tablet TAKE 1 TABLET BY MOUTH AT BEDTIME    benzonatate (TESSALON) 100 MG capsule TAKE 1 CAPSULE BY MOUTH THREE TIMES DAILY    Budeson-Glycopyrrol-Formoterol (BREZTRI AEROSPHERE) 160-9-4.8 MCG/ACT AERO Inhale 2 puffs into the lungs in the morning and at bedtime. (Patient not taking: Reported on 06/27/2021) 06/27/2021: Will finish trelegy in 17 days   cyclobenzaprine (FLEXERIL) 10 MG tablet Take 1 tablet (10 mg total) by mouth 3 (three) times daily as needed for muscle spasms.    DULoxetine (CYMBALTA) 60 MG capsule TAKE TWO CAPSULES BY MOUTH DAILY    fluticasone (FLONASE) 50 MCG/ACT nasal spray INSTILL 2 SPRAYS IN EACH NOSTRIL EVERY DAY    fluticasone-salmeterol (ADVAIR DISKUS) 250-50 MCG/ACT AEPB Inhale 1 puff into the lungs in the morning and at bedtime.    furosemide (LASIX) 20 MG tablet TAKE 1 TABLET BY MOUTH EVERY DAY AS NEEDED    HYDROcodone-acetaminophen (NORCO) 10-325 MG tablet Take 1 tablet by mouth every 4 (four) hours as needed (max 5 tabs/day).    levothyroxine (SYNTHROID) 137 MCG tablet Take 1 tablet (137 mcg total) by mouth daily before breakfast.    loratadine (CLARITIN) 10 MG tablet Take 10 mg by mouth  daily.    methadone (DOLOPHINE) 5 MG tablet Take 0.5 tablets (2.5 mg total) by mouth every 12 (twelve) hours.    OLANZapine (ZYPREXA) 10 MG tablet Take 1 tablet (10 mg total) by mouth at bedtime.    omeprazole (PRILOSEC) 40 MG capsule TAKE ONE CAPSULE BY MOUTH TWICE DAILY    ondansetron (ZOFRAN) 8 MG tablet TAKE 2 TABLETS BY MOUTH TWICE DAILY AS NEEDED (Patient taking differently: 8 mg 2 (two) times daily.)    OXYGEN Inhale 3 L into the lungs at bedtime. Uses At Night 11/13/2020: Uses 3l/min [place] @ night   potassium chloride (KLOR-CON M) 10 MEQ tablet TAKE 1 TABLET BY MOUTH AT BEDTIME    prochlorperazine (COMPAZINE) 10 MG tablet Take 1 tablet (10 mg total) by mouth every 6 (six) hours as needed for nausea or vomiting.    triamterene-hydrochlorothiazide (MAXZIDE-25) 37.5-25 MG tablet TAKE 1 TABLET BY MOUTH DAILY - EMERGENCY REFILL FAXED DR.    No facility-administered encounter medications on file as of 08/15/2021.   [facility] for General Review Call   Chart Review:  Have there been any documented new, changed, or discontinued medications since last visit? No  Has there been any documented recent hospitalizations or ED visits since last visit with Clinical Pharmacist? No   Adherence Review:  Does the Clinical Pharmacist Assistant have access to adherence rates? Yes Adherence rates for STAR metric medications: Atorvastatin 20 mg last filled 08/01/2021 90 DS Does  the patient have >5 day gap between last estimated fill dates for any of the above medications or other medication gaps? No Reason for medication gaps.   Disease State Questions:  Able to connect with Patient? Yes Did patient have any problems with their health recently? No Have you had any admissions or emergency room visits or worsening of your condition(s) since last visit? No Have you had any visits with new specialists or providers since your last visit? No Have you had any new health care problem(s) since  your last visit? No Have you run out of any of your medications since you last spoke with clinical pharmacist? No Are there any medications you are not taking as prescribed? No Are you having any issues or side effects with your medications? No Do you have any other health concerns or questions you want to discuss with your Clinical Pharmacist before your next visit? No Are there any health concerns that you feel we can do a better job addressing? No Are you having any problems with any of the following since the last visit:  None 12. Any falls since last visit? No 13. Any increased or uncontrolled pain since last visit? Yes  Details: Patient states her back has been hurting.  -Patient states she she believes she has a yeast infection under her breast from sweating when she sleeps. She states she has been itching a lot recently. Is there anything she can do to help with this?   Care Gaps: Medicare Annual Wellness: Completed 05/07/2021 Ophthalmology Exam: [name] - never done Hemoglobin A1C: 6.5% on 01/05/2021 Colonoscopy: Next due on 07/25/2027 Dexa Scan: Completed Mammogram: Next due on 05/04/2022  Future Appointments  Date Time Provider [place]  08/17/2021 10:00 AM Allwardt, Randa Evens, PA-C [facility]  08/29/2021 12:30 PM [facility] [place] FLUSH [facility] None  08/29/2021  1:00 PM Lincoln Brigham, PA-C [facility] None  08/29/2021  2:00 PM [facility] INFUSION [facility] None  08/29/2021  2:30 PM Morrell Riddle, RD [facility] None  09/19/2021  9:45 AM [facility] [place] FLUSH [facility] None  09/19/2021 10:20 AM Dede Query T, PA-C [facility] None  09/19/2021 11:00 AM [facility] INFUSION [facility] None  10/10/2021 10:15 AM [facility] [place] FLUSH [facility] None  10/10/2021 10:40 AM Orson Slick, MD [facility] None  10/10/2021 11:30 AM [facility] INFUSION [facility] None  10/22/2021  3:20 PM Lovorn, Jinny Blossom, MD [facility] CPR  10/31/2021  9:45 AM [facility] [place] FLUSH [facility] None   10/31/2021 10:20 AM Ledell Peoples IV, MD [facility] None  10/31/2021 11:15 AM [facility] INFUSION [facility] None  11/12/2021  3:30 PM [facility] CCM PHARMACIST [facility]  05/16/2022  1:30 PM [facility] HEALTH COACH [facility]   Star Rating Drugs: Atorvastatin 20 mg last filled 08/01/2021 90 DS  April D Calhoun, [place] Pharmacist Assistant [phone]

## 2021-08-17 ENCOUNTER — Ambulatory Visit
Admission: RE | Admit: 2021-08-17 | Discharge: 2021-08-17 | Disposition: A | Discharge status: Home / Self Care | Payer: Medicare Other | Source: Ambulatory Visit | Attending: Physician Assistant | Admitting: Physician Assistant

## 2021-08-17 ENCOUNTER — Ambulatory Visit (INDEPENDENT_AMBULATORY_CARE_PROVIDER_SITE_OTHER): Payer: Medicare Other | Admitting: Physician Assistant

## 2021-08-17 ENCOUNTER — Ambulatory Visit (INDEPENDENT_AMBULATORY_CARE_PROVIDER_SITE_OTHER)
Admission: RE | Admit: 2021-08-17 | Discharge: 2021-08-17 | Disposition: A | Discharge status: Home / Self Care | Payer: Medicare Other | Source: Ambulatory Visit | Attending: Physician Assistant | Admitting: Physician Assistant

## 2021-08-17 ENCOUNTER — Ambulatory Visit: Admission: RE | Admit: 2021-08-17 | Payer: Medicare Other | Source: Ambulatory Visit

## 2021-08-17 ENCOUNTER — Encounter: Payer: Self-pay | Admitting: Hematology and Oncology

## 2021-08-17 ENCOUNTER — Encounter: Payer: Self-pay | Admitting: Physician Assistant

## 2021-08-17 VITALS — BP 136/78 | HR 90 | Temp 97.9°F | Ht 64.0 in | Wt 198.6 lb

## 2021-08-17 DIAGNOSIS — R0781 Pleurodynia: Secondary | ICD-10-CM

## 2021-08-17 DIAGNOSIS — M25562 Pain in left knee: Secondary | ICD-10-CM | POA: Diagnosis not present

## 2021-08-17 DIAGNOSIS — F32A Depression, unspecified: Secondary | ICD-10-CM

## 2021-08-17 DIAGNOSIS — F419 Anxiety disorder, unspecified: Secondary | ICD-10-CM

## 2021-08-17 DIAGNOSIS — H9193 Unspecified hearing loss, bilateral: Secondary | ICD-10-CM

## 2021-08-17 DIAGNOSIS — G8929 Other chronic pain: Secondary | ICD-10-CM | POA: Diagnosis not present

## 2021-08-17 NOTE — Progress Notes (Signed)
Subjective:    Patient ID: [name], female    DOB: [birth year], 66 y.o.   MRN: [id number]  Chief Complaint  Patient presents with   Follow-up    Pt being seen for 3 mon f/u; pt is having issues with hearing, checking hearing in office today; possible referral to audiology; wants to go back on Meloxicam if possible for neuropathy and arthritis; Pain in left knee hip and lower back; almost due for refill on alprazolam; brace for left knee suggestions and place on upper back not healing    HPI Patient is in today for regular f/up; currently undergoing treatment with oncology for small cell lung cancer. Accompanied by husband today.   RUQ? pain x several months. Hurts most of the time. Worse with eating. Gallbladder is gone. Dog pushed off belly yesterday and it made things worse. Doesn't wrap around to the back.  Left knee pain, chronic, but acutely worse in last few weeks. Feels like it might give out on her. Soft knee brace request. Also requesting to start back on Meloxicam.  Anxiety is overall stable. Due for refill of Xanax soon.  Past Medical History:  Diagnosis Date   Allergic rhinitis    Anemia    Anxiety    Chicken pox    Chronic back pain    COPD (chronic obstructive pulmonary disease) (HCC)    Depression    DM (diabetes mellitus) ([facility])    Essential hypertension    GERD (gastroesophageal reflux disease)    Headache    migraines   History of gastritis    EGD 2015   History of home oxygen therapy    2 liters at hs last 6 months   Hyperlipidemia    Hypothyroidism    Migraines    Osteoarthritis    oa   Scoliosis     Past Surgical History:  Procedure Laterality Date   APPENDECTOMY     1985   BIOPSY  07/24/2017   Procedure: BIOPSY;  Surgeon: Milus Banister, MD;  [facility]: [facility];  Service: Endoscopy;;   CARDIAC CATHETERIZATION N/A 10/31/2015   Procedure: Left Heart Cath and Coronary Angiography;  Surgeon: Leonie Man, MD;  Location: [facility]  CV [facility];  Service: Cardiovascular;  Laterality: N/A;   CARPAL TUNNEL RELEASE Left    CARPAL TUNNEL RELEASE Right    CHOLECYSTECTOMY  late 1980's   COLONOSCOPY WITH PROPOFOL N/A 07/24/2017   Procedure: COLONOSCOPY WITH PROPOFOL;  Surgeon: Milus Banister, MD;  [facility];  Service: Endoscopy;  Laterality: N/A;   ESOPHAGOGASTRODUODENOSCOPY N/A 07/24/2017   Procedure: ESOPHAGOGASTRODUODENOSCOPY (EGD);  Surgeon: Milus Banister, MD;  Location: [facility];  Service: Endoscopy;  Laterality: N/A;   ESOPHAGOGASTRODUODENOSCOPY (EGD) WITH PROPOFOL N/A 10/19/2020   Procedure: ESOPHAGOGASTRODUODENOSCOPY (EGD) WITH PROPOFOL;  Surgeon: Milus Banister, MD;  [facility];  Service: Endoscopy;  Laterality: N/A;   FINE NEEDLE ASPIRATION N/A 10/19/2020   Procedure: FINE NEEDLE ASPIRATION (FNA) LINEAR;  Surgeon: Milus Banister, MD;  [facility];  Service: Endoscopy;  Laterality: N/A;   GALLBLADDER SURGERY  1991   HIP CLOSED REDUCTION Right 01/08/2016   Procedure: CLOSED MANIPULATION HIP;  Surgeon: Susa Day, MD;  [facility];  Service: Orthopedics;  Laterality: Right;   HIP CLOSED REDUCTION Right 01/19/2016   Procedure: ATTEMPTED CLOSED REDUCTION RIGHT HIP;  Surgeon: Wylene Simmer, MD;  [facility];  Service: Orthopedics;  Laterality: Right;   HIP CLOSED REDUCTION Right 01/20/2016  Procedure: CLOSED REDUCTION RIGHT TOTAL HIP;  Surgeon: Paralee Cancel, MD;  [facility];  Service: Orthopedics;  Laterality: Right;   HIP CLOSED REDUCTION Right 02/17/2016   Procedure: CLOSED REDUCTION RIGHT TOTAL HIP;  Surgeon: Rod Can, MD;  Location: [facility];  Service: Orthopedics;  Laterality: Right;   HIP CLOSED REDUCTION Right 02/28/2016   Procedure: CLOSED REDUCTION HIP;  Surgeon: Nicholes Stairs, MD;  [facility];  Service: Orthopedics;  Laterality: Right;   IR IMAGING GUIDED PORT INSERTION  11/01/2020   POLYPECTOMY  07/24/2017   Procedure: POLYPECTOMY;  Surgeon:  Milus Banister, MD;  [facility];  Service: Endoscopy;;   [place], with 1 ovary removed and 2 nd ovary removed 2003   TOTAL HIP ARTHROPLASTY Right    Original surgery 2006 with revision 2010   TOTAL HIP REVISION Right 01/01/2016   Procedure: TOTAL HIP REVISION;  Surgeon: Paralee Cancel, MD;  [facility];  Service: Orthopedics;  Laterality: Right;   TOTAL HIP REVISION Right 03/02/2016   Procedure: TOTAL HIP REVISION;  Surgeon: Paralee Cancel, MD;  [facility];  Service: Orthopedics;  Laterality: Right;   TOTAL HIP REVISION Right 09/02/2016   Procedure: Right hip constrained liner- posterior;  Surgeon: Paralee Cancel, MD;  [facility];  Service: Orthopedics;  Laterality: Right;   ULNAR NERVE TRANSPOSITION Right    UPPER ESOPHAGEAL ENDOSCOPIC ULTRASOUND (EUS) N/A 10/19/2020   Procedure: UPPER ESOPHAGEAL ENDOSCOPIC ULTRASOUND (EUS);  Surgeon: Milus Banister, MD;  Location: [facility];  Service: Endoscopy;  Laterality: N/A;  periduodenal lesion    Family History  Problem Relation Age of Onset   COPD Mother    Heart disease Mother    Lung disease Father        Asbestosis   Heart attack Father    Heart disease Father    Cerebral aneurysm Brother    Aneurysm Brother        Brain   Drug abuse Daughter    Epilepsy Son    Alcohol abuse Son    Drug abuse Son    Arthritis Maternal Grandmother    Heart disease Maternal Grandmother    Asthma Maternal Grandfather    Cancer Maternal Grandfather    Arthritis Paternal Grandmother    Heart disease Paternal Grandmother    Stroke Paternal Grandmother    Early death Paternal Grandfather    Heart disease Paternal Grandfather     Social History   Tobacco Use   Smoking status: Every Day    Packs/day: 2.00    Years: 46.00    Total pack years: 92.00    Types: Cigarettes   Smokeless tobacco: Never   Tobacco comments:    2 packs of cigarettes smoked daily ARJ 04/10/21   Vaping Use   Vaping Use: Never used  Substance Use Topics   Alcohol use: No   Drug use: No     Allergies  Allergen Reactions   Metformin And Related Diarrhea   Nsaids Diarrhea   Wellbutrin [Bupropion] Other (See Comments)    Makes her too sleepy    Aleve [Naproxen Sodium] Other (See Comments)    Headache    Codeine Nausea Only and Other (See Comments)    GI upset   Penicillins Nausea Only and Other (See Comments)    GI upset    Sulfonamide Derivatives Hives    Review of Systems NEGATIVE UNLESS OTHERWISE INDICATED IN HPI  Objective:     BP 136/78 (BP Location: Right Arm)   Pulse 90   Temp 97.9 F (36.6 C) (Temporal)   Ht 5\' 4"  (1.626 [name])   Wt 198 lb 9.6 oz (90.1 kg)   SpO2 94%   BMI 34.09 kg/[name]   Wt Readings from Last 3 Encounters:  08/17/21 198 lb 9.6 oz (90.1 kg)  08/08/21 197 lb 6.4 oz (89.5 kg)  07/18/21 200 lb 14.4 oz (91.1 kg)    BP Readings from Last 3 Encounters:  08/17/21 136/78  08/08/21 (!) 157/87  07/18/21 139/85     Physical Exam Vitals and nursing note reviewed.  Constitutional:      Appearance: Normal appearance. She is not ill-appearing or toxic-appearing.     Comments: Rollator walker   HENT:     Head: Normocephalic and atraumatic.  Eyes:     Extraocular Movements: Extraocular movements intact.     Conjunctiva/sclera: Conjunctivae normal.     Pupils: Pupils are equal, round, and reactive to light.  Cardiovascular:     Rate and Rhythm: Normal rate and regular rhythm.     Pulses: Normal pulses.  Pulmonary:     Effort: Pulmonary effort is normal.  Chest:    Abdominal:     General: Abdomen is flat. Bowel sounds are normal.     Palpations: Abdomen is soft. There is no mass.     Tenderness: There is no right CVA tenderness, left CVA tenderness, guarding or rebound.  Musculoskeletal:     Cervical back: Normal range of motion and neck supple.     Left knee: Deformity and bony tenderness present. No erythema. Decreased range  of motion. Tenderness present.  Skin:    General: Skin is warm and dry.  Neurological:     General: No focal deficit present.     Mental Status: She is alert and oriented to [name], place, and time.  Psychiatric:        Mood and Affect: Mood and affect normal. Mood is not depressed. Affect is not tearful.        Behavior: Behavior normal.        Thought Content: Thought content normal.        Judgment: Judgment normal.        Assessment & Plan:   Problem List Items Addressed This Visit       Other   Anxiety and depression   Other Visit Diagnoses     Chronic pain of left knee    -  Primary   Relevant Medications   meloxicam (MOBIC) 15 MG tablet   Other Relevant Orders   DG Knee Complete 4 Views Left (Completed)   Rib pain on right side       Relevant Orders   DG Ribs Unilateral W/Chest Right (Completed)   Bilateral hearing loss, unspecified hearing loss type       Relevant Orders   Ambulatory referral to Audiology        Meds ordered this encounter  Medications   meloxicam (MOBIC) 15 MG tablet    Sig: Take 1 tablet (15 mg total) by mouth daily.    Dispense:  30 tablet    Refill:  2    Order Specific Question:   Supervising Provider    Answer:   Yong Channel, STEPHEN O [[id number]]    1. Chronic pain of left knee Pt requesting to start back on Meloxicam; known arthritis. Will update XRAY today.   2. Rib pain on right side  No deformity noted. Could be rib strain. Will check Rib XRAY, r/o bony metastasis worst case.   3. Bilateral hearing loss, unspecified hearing loss type Referral sent to audiologist  4. Anxiety and depression Stable overall PDMP reviewed today, no red flags, filling appropriately.  Cont Xanx 1 mg TID Cymbalta 60 mg daily   F/up 3-6 months of prn   [name] Evian Derringer, PA-C

## 2021-08-20 ENCOUNTER — Other Ambulatory Visit: Payer: Self-pay

## 2021-08-21 ENCOUNTER — Other Ambulatory Visit: Payer: Self-pay | Admitting: Physician Assistant

## 2021-08-21 DIAGNOSIS — R609 Edema, unspecified: Secondary | ICD-10-CM

## 2021-08-22 ENCOUNTER — Other Ambulatory Visit: Payer: Self-pay

## 2021-08-22 DIAGNOSIS — H9193 Unspecified hearing loss, bilateral: Secondary | ICD-10-CM

## 2021-08-23 NOTE — Chronic Care Management (AMB) (Signed)
Patient may need to come in to be evaluated for fungal infection.  There are some powders and creams but they would need prescription.  Beverly Milch, PharmD Clinical Pharmacist  [facility] [phone]

## 2021-08-26 ENCOUNTER — Other Ambulatory Visit: Payer: Self-pay | Admitting: Physical Medicine and Rehabilitation

## 2021-08-26 DIAGNOSIS — G894 Chronic pain syndrome: Secondary | ICD-10-CM

## 2021-08-26 MED ORDER — MELOXICAM 15 MG PO TABS: 15.0000 mg | ORAL_TABLET | Freq: Every day | ORAL | 2 refills | Status: DC

## 2021-08-27 ENCOUNTER — Telehealth: Payer: Self-pay

## 2021-08-27 MED ORDER — HYDROCODONE-ACETAMINOPHEN 10-325 MG PO TABS: 1.0000 | ORAL_TABLET | ORAL | 0 refills | Status: DC | PRN

## 2021-08-27 MED ORDER — METHADONE HCL 5 MG PO TABS: 2.5000 mg | ORAL_TABLET | Freq: Two times a day (BID) | ORAL | 0 refills | Status: DC

## 2021-08-27 NOTE — Telephone Encounter (Signed)
PA for Methadone sent to insurance through CoverMyMeds

## 2021-08-27 NOTE — Telephone Encounter (Signed)
PA for Hydrocodone sent to insurance through CoverMyMeds ?

## 2021-08-28 NOTE — Telephone Encounter (Signed)
This medication or product was previously approved on [id number] from 08/27/2021 to 01/27/2022. You will be able to fill a prescription for this medication at your pharmacy. If your pharmacy has questions regarding the processing of your prescription, please have them call the [facility] pharmacy help desk at [phone].

## 2021-08-28 NOTE — Telephone Encounter (Signed)
Request Reference Number: [id number]. METHADONE TAB 5MG  is approved through 01/27/2022. Your patient may now fill this prescription and it will be covered.

## 2021-08-29 ENCOUNTER — Inpatient Hospital Stay: Payer: Medicare Other | Attending: Hematology and Oncology

## 2021-08-29 ENCOUNTER — Inpatient Hospital Stay (HOSPITAL_BASED_OUTPATIENT_CLINIC_OR_DEPARTMENT_OTHER): Payer: Medicare Other | Admitting: Physician Assistant

## 2021-08-29 ENCOUNTER — Other Ambulatory Visit: Payer: Self-pay

## 2021-08-29 ENCOUNTER — Other Ambulatory Visit: Payer: Self-pay | Admitting: Family Medicine

## 2021-08-29 ENCOUNTER — Inpatient Hospital Stay: Payer: Medicare Other

## 2021-08-29 ENCOUNTER — Inpatient Hospital Stay: Payer: Medicare Other | Admitting: Dietician

## 2021-08-29 VITALS — BP 132/68 | HR 96 | Temp 97.2°F | Resp 18 | Wt 203.8 lb

## 2021-08-29 DIAGNOSIS — F419 Anxiety disorder, unspecified: Secondary | ICD-10-CM | POA: Diagnosis not present

## 2021-08-29 DIAGNOSIS — Z814 Family history of other substance abuse and dependence: Secondary | ICD-10-CM | POA: Insufficient documentation

## 2021-08-29 DIAGNOSIS — Z823 Family history of stroke: Secondary | ICD-10-CM | POA: Insufficient documentation

## 2021-08-29 DIAGNOSIS — Z9049 Acquired absence of other specified parts of digestive tract: Secondary | ICD-10-CM | POA: Insufficient documentation

## 2021-08-29 DIAGNOSIS — E785 Hyperlipidemia, unspecified: Secondary | ICD-10-CM | POA: Diagnosis not present

## 2021-08-29 DIAGNOSIS — J449 Chronic obstructive pulmonary disease, unspecified: Secondary | ICD-10-CM | POA: Insufficient documentation

## 2021-08-29 DIAGNOSIS — Z8249 Family history of ischemic heart disease and other diseases of the circulatory system: Secondary | ICD-10-CM | POA: Insufficient documentation

## 2021-08-29 DIAGNOSIS — C3490 Malignant neoplasm of unspecified part of unspecified bronchus or lung: Secondary | ICD-10-CM

## 2021-08-29 DIAGNOSIS — M7989 Other specified soft tissue disorders: Secondary | ICD-10-CM | POA: Insufficient documentation

## 2021-08-29 DIAGNOSIS — G629 Polyneuropathy, unspecified: Secondary | ICD-10-CM | POA: Insufficient documentation

## 2021-08-29 DIAGNOSIS — G8929 Other chronic pain: Secondary | ICD-10-CM | POA: Diagnosis not present

## 2021-08-29 DIAGNOSIS — Z79890 Hormone replacement therapy (postmenopausal): Secondary | ICD-10-CM | POA: Insufficient documentation

## 2021-08-29 DIAGNOSIS — E119 Type 2 diabetes mellitus without complications: Secondary | ICD-10-CM | POA: Insufficient documentation

## 2021-08-29 DIAGNOSIS — Z8719 Personal history of other diseases of the digestive system: Secondary | ICD-10-CM | POA: Insufficient documentation

## 2021-08-29 DIAGNOSIS — C3431 Malignant neoplasm of lower lobe, right bronchus or lung: Secondary | ICD-10-CM | POA: Insufficient documentation

## 2021-08-29 DIAGNOSIS — R197 Diarrhea, unspecified: Secondary | ICD-10-CM | POA: Insufficient documentation

## 2021-08-29 DIAGNOSIS — Z888 Allergy status to other drugs, medicaments and biological substances status: Secondary | ICD-10-CM | POA: Insufficient documentation

## 2021-08-29 DIAGNOSIS — R110 Nausea: Secondary | ICD-10-CM

## 2021-08-29 DIAGNOSIS — R0600 Dyspnea, unspecified: Secondary | ICD-10-CM | POA: Insufficient documentation

## 2021-08-29 DIAGNOSIS — Z5112 Encounter for antineoplastic immunotherapy: Secondary | ICD-10-CM | POA: Insufficient documentation

## 2021-08-29 DIAGNOSIS — Z90721 Acquired absence of ovaries, unilateral: Secondary | ICD-10-CM | POA: Insufficient documentation

## 2021-08-29 DIAGNOSIS — R519 Headache, unspecified: Secondary | ICD-10-CM | POA: Diagnosis not present

## 2021-08-29 DIAGNOSIS — Z882 Allergy status to sulfonamides status: Secondary | ICD-10-CM | POA: Diagnosis not present

## 2021-08-29 DIAGNOSIS — Z885 Allergy status to narcotic agent status: Secondary | ICD-10-CM | POA: Diagnosis not present

## 2021-08-29 DIAGNOSIS — Z880 Allergy status to penicillin: Secondary | ICD-10-CM | POA: Insufficient documentation

## 2021-08-29 DIAGNOSIS — Z886 Allergy status to analgesic agent status: Secondary | ICD-10-CM | POA: Diagnosis not present

## 2021-08-29 DIAGNOSIS — R600 Localized edema: Secondary | ICD-10-CM | POA: Diagnosis not present

## 2021-08-29 DIAGNOSIS — E876 Hypokalemia: Secondary | ICD-10-CM | POA: Insufficient documentation

## 2021-08-29 DIAGNOSIS — Z836 Family history of other diseases of the respiratory system: Secondary | ICD-10-CM | POA: Insufficient documentation

## 2021-08-29 DIAGNOSIS — Z79899 Other long term (current) drug therapy: Secondary | ICD-10-CM | POA: Insufficient documentation

## 2021-08-29 DIAGNOSIS — F17210 Nicotine dependence, cigarettes, uncomplicated: Secondary | ICD-10-CM | POA: Insufficient documentation

## 2021-08-29 DIAGNOSIS — F32A Depression, unspecified: Secondary | ICD-10-CM | POA: Insufficient documentation

## 2021-08-29 DIAGNOSIS — Z5986 Financial insecurity: Secondary | ICD-10-CM | POA: Insufficient documentation

## 2021-08-29 DIAGNOSIS — Z95828 Presence of other vascular implants and grafts: Secondary | ICD-10-CM

## 2021-08-29 DIAGNOSIS — Z820 Family history of epilepsy and other diseases of the nervous system: Secondary | ICD-10-CM | POA: Insufficient documentation

## 2021-08-29 DIAGNOSIS — Z8261 Family history of arthritis: Secondary | ICD-10-CM | POA: Insufficient documentation

## 2021-08-29 LAB — CBC WITH DIFFERENTIAL (CANCER CENTER ONLY)
Abs Immature Granulocytes: 0.02 10*3/uL (ref 0.00–0.07)
Basophils Absolute: 0 10*3/uL (ref 0.0–0.1)
Basophils Relative: 1 %
Eosinophils Absolute: 0.4 10*3/uL (ref 0.0–0.5)
Eosinophils Relative: 4 %
HCT: 38.8 % (ref 36.0–46.0)
Hemoglobin: 12.4 g/dL (ref 12.0–15.0)
Immature Granulocytes: 0 %
Lymphocytes Relative: 33 %
Lymphs Abs: 2.9 10*3/uL (ref 0.7–4.0)
MCH: 27 pg (ref 26.0–34.0)
MCHC: 32 g/dL (ref 30.0–36.0)
MCV: 84.3 fL (ref 80.0–100.0)
Monocytes Absolute: 0.7 10*3/uL (ref 0.1–1.0)
Monocytes Relative: 8 %
Neutro Abs: 4.7 10*3/uL (ref 1.7–7.7)
Neutrophils Relative %: 54 %
Platelet Count: 186 10*3/uL (ref 150–400)
RBC: 4.6 MIL/uL (ref 3.87–5.11)
RDW: 19.2 % — ABNORMAL HIGH (ref 11.5–15.5)
WBC Count: 8.8 10*3/uL (ref 4.0–10.5)
nRBC: 0 % (ref 0.0–0.2)

## 2021-08-29 LAB — TSH: TSH: 0.565 u[IU]/mL (ref 0.350–4.500)

## 2021-08-29 LAB — CMP (CANCER CENTER ONLY)
ALT: 9 U/L (ref 0–44)
AST: 14 U/L — ABNORMAL LOW (ref 15–41)
Albumin: 3.6 g/dL (ref 3.5–5.0)
Alkaline Phosphatase: 92 U/L (ref 38–126)
Anion gap: 5 (ref 5–15)
BUN: 13 mg/dL (ref 8–23)
CO2: 30 mmol/L (ref 22–32)
Calcium: 8.4 mg/dL — ABNORMAL LOW (ref 8.9–10.3)
Chloride: 98 mmol/L (ref 98–111)
Creatinine: 1.02 mg/dL — ABNORMAL HIGH (ref 0.44–1.00)
GFR, Estimated: >60 mL/min (ref >60)
Glucose, Bld: 82 mg/dL (ref 70–99)
Potassium: 4 mmol/L (ref 3.5–5.1)
Sodium: 133 mmol/L — ABNORMAL LOW (ref 135–145)
Total Bilirubin: 0.4 mg/dL (ref 0.3–1.2)
Total Protein: 6.5 g/dL (ref 6.5–8.1)

## 2021-08-29 MED ORDER — SODIUM CHLORIDE 0.9% FLUSH
10.0000 mL | Freq: Once | INTRAVENOUS | Status: AC
  Administered 2021-08-29: 10 mL

## 2021-08-29 MED ORDER — SODIUM CHLORIDE 0.9 % IV SOLN
1200.0000 mg | Freq: Once | INTRAVENOUS | Status: AC
  Administered 2021-08-29: 1200 mg via INTRAVENOUS
  Filled 2021-08-29: qty 20

## 2021-08-29 MED ORDER — SODIUM CHLORIDE 0.9 % IV SOLN: Freq: Once | INTRAVENOUS | Status: AC

## 2021-08-29 MED ORDER — HEPARIN SOD (PORK) LOCK FLUSH 100 UNIT/ML IV SOLN
500.0000 [IU] | Freq: Once | INTRAVENOUS | Status: AC | PRN
  Administered 2021-08-29: 500 [IU]

## 2021-08-29 MED ORDER — SODIUM CHLORIDE 0.9 % IV SOLN: 8.0000 mg | Freq: Once | INTRAVENOUS | Status: DC

## 2021-08-29 MED ORDER — SODIUM CHLORIDE 0.9% FLUSH
10.0000 mL | INTRAVENOUS | Status: DC | PRN
  Administered 2021-08-29: 10 mL

## 2021-08-29 MED ORDER — ONDANSETRON HCL 4 MG/2ML IJ SOLN
8.0000 mg | Freq: Once | INTRAMUSCULAR | Status: AC
  Administered 2021-08-29: 8 mg via INTRAVENOUS
  Filled 2021-08-29: qty 4

## 2021-08-29 NOTE — Progress Notes (Signed)
Nutrition Follow-up:  Patient with extensive stage small cell lung cancer. She is currently receiving maintenance chemotherapy with Atezolizumab.    Met with patient in infusion. She reports ongoing poor appetite. Patient surprised she gained weight. States she typically has no appetite for ~10 days following treatment. Patient recalls eating 2-4 PM most days. She does not have food before or after this. Patient recalls eating a couple tomato sandwiches. Sometimes she snacks (trail mix, cashews, brown sugar pop tarts). Patient drinks "at least" 64 oz of water plus 2 cups of coffee. She has not been drinking as much water recently as she has had workers [employer] manager from recent leak from refrigerator. She does not want to have to keep going to the bathroom while they are there. She denies nausea, vomiting, diarrhea, constipation.  Medications: reviewed   Labs: Na 133, Cr 1.02   Anthropometrics: Weight 203 lb 12.8 oz today    NUTRITION DIAGNOSIS: Inadequate oral intake continues    INTERVENTION:  Educated on including protein source with meals and snacks   MONITORING, EVALUATION, GOAL: weight trends, intake    NEXT VISIT: Wednesday, September 13 during infusion

## 2021-08-30 ENCOUNTER — Encounter: Payer: Self-pay | Admitting: Hematology and Oncology

## 2021-08-30 NOTE — Progress Notes (Signed)
[place] [phone]   [phone]  PROGRESS NOTE  Patient Care Team: Allwardt, Randa Evens, PA-C as PCP - General (Physician Assistant) Milus Banister, MD as Attending Physician (Gastroenterology) Dr. Darylene Price, MD as Consulting Physician (Orthopedic Surgery) Latanya Maudlin, MD as Consulting Physician (Orthopedic Surgery) Kathrynn Ducking, MD (Inactive) as Consulting Physician (Neurology) Okey Regal, [place] as Consulting Physician (Optometry) Delice Lesch Lezlie Octave, MD as Consulting Physician (Neurology) Leona Singleton, RN as [place], [facility] (Pharmacist)  Hematological/Oncological History # Small Cell Lung Cancer, Extensive Stage 07/05/2020: CT abdomen for lower abdominal pain. New pulmonary nodular density noted 07/06/2020: CT chest showed 2.2 cm macrolobulated right lower lobe pulmonary nodule (favored) versus pathologically enlarged infrahilar lymph node 10/13/2020: PET CT scan performed, findings show 2 cm right lower lobe lung mass is hypermetabolic and consistent with primary lung neoplasm. Additionally found hypermetabolic 17 mm soft tissue lesion between the descending duodenum and the pancreatic head  10/19/2020: EGD to biopsy hypermetabolic lymph node. Biopsy results show small cell lung cancer 10/26/2020: establish care with Dr. Lorenso Courier  11/13/2020: Cycle 1 Day 1 of Carbo/Etop/Atezolizumab 12/04/2020: Cycle 2 Day 1 of Carbo/Etop/Atezolizumab 11/22-11/25/2022: admitted for E. Coli bacteremia/sepsis. Start of Cycle 3 delayed. 01/02/2021: Cycle 3 Day 1 of Carbo/Etop/Atezolizumab 01/23/2021: Cycle 4 Day 1 of Carbo/Etop/Atezolizumab 02/22/2021: Cycle 5 Day 1 of Atezolizumab Maintenance. Delayed start due to patient's COVID infection.  03/14/2021: Cycle 6 Day 1 of Atezolizumab Maintenance 04/05/2021: Cycle 7 Day 1 of Atezolizumab Maintenance 04/25/2021: Cycle 8 Day 1 of Atezolizumab  Maintenance 05/16/2021: Cycle 9 Day 1 of Atezolizumab Maintenance 06/06/2021: Cycle 10 Day 1 of Atezolizumab Maintenance 06/27/2021:  Cycle 11 Day 1 of Atezolizumab Maintenance 07/18/2021: Cycle 12 Day 1 of Atezolizumab Maintenance 08/08/2021: Cycle 13 Day 1 of Atezolizumab Maintenance 08/29/2021: Cycle 14 Day 1 of Atezolizumab Maintenance  Interval History:  [name] 66 y.o. female with medical history significant for extensive stage small cell lung cancer who presents for a follow up visit. The patient's last visit was on 08/08/2021. In the interim since the last visit she has completed cycle 13 of chemotherapy. She presents today to start cycle 14 of chemotherapy.  On exam today [name] reports her energy levels have been stable since the last visit.  She continues to have chronic fatigue but is able to complete her daily activities on her own.  Her appetite is unchanged without any significant weight changes.  She reports intermittent episodes of nausea after each treatment.  She takes Zofran and olanzapine with some relief.  She denies any abdominal pain.  Her bowel habits have become more loose since starting meloxicam for joint pain.  She has chronic headaches.  She denies any bruising or active bleeding.  She reports worsening lower extremity edema.  She takes Lasix 20 mg as needed.  She denies fevers, chills, night sweats, chest pain or cough.She has no other complaints. A full 10 point ROS is listed below.   MEDICAL HISTORY:  Past Medical History:  Diagnosis Date   Allergic rhinitis    Anemia    Anxiety    Chicken pox    Chronic back pain    COPD (chronic obstructive pulmonary disease) (HCC)    Depression    DM (diabetes mellitus) ([place])    Essential hypertension    GERD (gastroesophageal reflux disease)    Headache    migraines   History of gastritis    EGD 2015  History of home oxygen therapy    2 liters at hs last 6 months   Hyperlipidemia    Hypothyroidism     Migraines    Osteoarthritis    oa   Scoliosis     SURGICAL HISTORY: Past Surgical History:  Procedure Laterality Date   APPENDECTOMY     1985   BIOPSY  07/24/2017   Procedure: BIOPSY;  Surgeon: Milus Banister, MD;  Location: [facility];  Service: Endoscopy;;   CARDIAC CATHETERIZATION N/A 10/31/2015   Procedure: Left Heart Cath and Coronary Angiography;  Surgeon: Leonie Man, MD;  Location: [facility];  Service: Cardiovascular;  Laterality: N/A;   CARPAL TUNNEL RELEASE Left    CARPAL TUNNEL RELEASE Right    CHOLECYSTECTOMY  late 1980's   COLONOSCOPY WITH PROPOFOL N/A 07/24/2017   Procedure: COLONOSCOPY WITH PROPOFOL;  Surgeon: Milus Banister, MD;  Location: [facility];  Service: Endoscopy;  Laterality: N/A;   ESOPHAGOGASTRODUODENOSCOPY N/A 07/24/2017   Procedure: ESOPHAGOGASTRODUODENOSCOPY (EGD);  Surgeon: Milus Banister, MD;  Location: [facility];  Service: Endoscopy;  Laterality: N/A;   ESOPHAGOGASTRODUODENOSCOPY (EGD) WITH PROPOFOL N/A 10/19/2020   Procedure: ESOPHAGOGASTRODUODENOSCOPY (EGD) WITH PROPOFOL;  Surgeon: Milus Banister, MD;  [facility]: [facility];  Service: Endoscopy;  Laterality: N/A;   FINE NEEDLE ASPIRATION N/A 10/19/2020   Procedure: FINE NEEDLE ASPIRATION (FNA) LINEAR;  Surgeon: Milus Banister, MD;  Location: [facility];  Service: Endoscopy;  Laterality: N/A;   GALLBLADDER SURGERY  1991   HIP CLOSED REDUCTION Right 01/08/2016   Procedure: CLOSED MANIPULATION HIP;  Surgeon: Susa Day, MD;  Location: [facility];  Service: Orthopedics;  Laterality: Right;   HIP CLOSED REDUCTION Right 01/19/2016   Procedure: ATTEMPTED CLOSED REDUCTION RIGHT HIP;  Surgeon: Wylene Simmer, MD;  Location: [facility];  Service: Orthopedics;  Laterality: Right;   HIP CLOSED REDUCTION Right 01/20/2016   Procedure: CLOSED REDUCTION RIGHT TOTAL HIP;  Surgeon: Paralee Cancel, MD;  Location: [facility];  Service: Orthopedics;  Laterality: Right;   HIP CLOSED REDUCTION Right 02/17/2016    Procedure: CLOSED REDUCTION RIGHT TOTAL HIP;  Surgeon: Rod Can, MD;  Location: [place];  Service: Orthopedics;  Laterality: Right;   HIP CLOSED REDUCTION Right 02/28/2016   Procedure: CLOSED REDUCTION HIP;  Surgeon: Nicholes Stairs, MD;  Location: [facility];  Service: Orthopedics;  Laterality: Right;   IR IMAGING GUIDED PORT INSERTION  11/01/2020   POLYPECTOMY  07/24/2017   Procedure: POLYPECTOMY;  Surgeon: Milus Banister, MD;  Location: [facility];  Service: Endoscopy;;   [place], with 1 ovary removed and 2 nd ovary removed 2003   TOTAL HIP ARTHROPLASTY Right    Original surgery 2006 with revision 2010   TOTAL HIP REVISION Right 01/01/2016   Procedure: TOTAL HIP REVISION;  Surgeon: Paralee Cancel, MD;  Location: [facility];  Service: Orthopedics;  Laterality: Right;   TOTAL HIP REVISION Right 03/02/2016   Procedure: TOTAL HIP REVISION;  Surgeon: Paralee Cancel, MD;  Location: [facility];  Service: Orthopedics;  Laterality: Right;   TOTAL HIP REVISION Right 09/02/2016   Procedure: Right hip constrained liner- posterior;  Surgeon: Paralee Cancel, MD;  Location: [facility];  Service: Orthopedics;  Laterality: Right;   ULNAR NERVE TRANSPOSITION Right    UPPER ESOPHAGEAL ENDOSCOPIC ULTRASOUND (EUS) N/A 10/19/2020   Procedure: UPPER ESOPHAGEAL ENDOSCOPIC ULTRASOUND (EUS);  Surgeon: Milus Banister, MD;  Location: [facility];  Service: Endoscopy;  Laterality: N/A;  periduodenal lesion    SOCIAL HISTORY: Social History   Socioeconomic History   Marital status: Married    Spouse name: Not on file   Number of children: 2   Years of education: Not on file   Highest education level: Not on file  Occupational History   Occupation: disabled   Occupation: disabled  Tobacco Use   Smoking status: Every Day    Packs/day: 2.00    Years: 46.00    Total pack years: 92.00    Types: Cigarettes   Smokeless tobacco: Never   Tobacco comments:    2 packs of  cigarettes smoked daily [facility] 04/10/21  Vaping Use   Vaping Use: Never used  Substance and Sexual Activity   Alcohol use: No   Drug use: No   Sexual activity: Not Currently    Partners: Male  Other Topics Concern   Not on file  Social History Narrative   Right handed    Caffeine~ 2 cups per day    Lives at home with husband (strained relationship)   Primary caretaker for disabled brother who had aneurism   Daughter died Jun 27, 2018    Social Determinants of Health   Financial Resource Strain: Medium Risk (05/03/2021)   Overall Financial Resource Strain (CARDIA)    Difficulty of Paying Living Expenses: Somewhat hard  Food Insecurity: No Food Insecurity (05/03/2021)   Hunger Vital Sign    Worried About Running Out of Food in the Last Year: Never true    Ran Out of Food in the Last Year: Never true  Transportation Needs: No Transportation Needs (12/27/2019)   PRAPARE - Hydrologist (Medical): No    Lack of Transportation (Non-Medical): No  Physical Activity: Inactive (05/03/2021)   Exercise Vital Sign    Days of Exercise per Week: 0 days    Minutes of Exercise per Session: 0 min  Stress: Stress Concern Present (05/03/2021)   [place]    Feeling of Stress : To some extent  Social Connections: Moderately Integrated (05/03/2021)   Social Connection and Isolation Panel [NHANES]    Frequency of Communication with Friends and Family: More than three times a week    Frequency of Social Gatherings with Friends and Family: Once a week    Attends Religious Services: 1 to 4 times per year    Active Member of [employer] or Organizations: No    Attends Archivist Meetings: Never    Marital Status: Married  Human resources officer Violence: Not At Risk (05/03/2021)   Humiliation, Afraid, Rape, and Kick questionnaire    Fear of Current or Ex-Partner: No    Emotionally Abused: No    Physically Abused: No     Sexually Abused: No    FAMILY HISTORY: Family History  Problem Relation Age of Onset   COPD Mother    Heart disease Mother    Lung disease Father        Asbestosis   Heart attack Father    Heart disease Father    Cerebral aneurysm Brother    Aneurysm Brother        Brain   Drug abuse Daughter    Epilepsy Son    Alcohol abuse Son    Drug abuse Son    Arthritis Maternal Grandmother    Heart disease Maternal Grandmother    Asthma Maternal Grandfather    Cancer Maternal Grandfather    Arthritis  Paternal Grandmother    Heart disease Paternal Grandmother    Stroke Paternal Grandmother    Early death Paternal Grandfather    Heart disease Paternal Grandfather     ALLERGIES:  is allergic to metformin and related, nsaids, wellbutrin [bupropion], aleve [naproxen sodium], codeine, penicillins, and sulfonamide derivatives.  MEDICATIONS:  Current Outpatient Medications  Medication Sig Dispense Refill   albuterol (PROAIR HFA) 108 (90 Base) MCG/ACT inhaler 2 puffs every 4 hours as needed only  if your can't catch your breath 18 g 3   atorvastatin (LIPITOR) 20 MG tablet TAKE 1 TABLET BY MOUTH AT BEDTIME 90 tablet 1   benzonatate (TESSALON) 100 MG capsule TAKE 1 CAPSULE BY MOUTH THREE TIMES DAILY 30 capsule 1   Budeson-Glycopyrrol-Formoterol (BREZTRI AEROSPHERE) 160-9-4.8 MCG/ACT AERO Inhale 2 puffs into the lungs in the morning and at bedtime. 10.7 g 3   cyclobenzaprine (FLEXERIL) 10 MG tablet Take 1 tablet (10 mg total) by mouth 3 (three) times daily as needed for muscle spasms. 90 tablet 5   DULoxetine (CYMBALTA) 60 MG capsule TAKE TWO CAPSULES BY MOUTH DAILY 60 capsule 5   fluticasone (FLONASE) 50 MCG/ACT nasal spray INSTILL 2 SPRAYS IN EACH NOSTRIL EVERY DAY 16 g 6   fluticasone-salmeterol (ADVAIR DISKUS) 250-50 MCG/ACT AEPB Inhale 1 puff into the lungs in the morning and at bedtime. 60 each 5   furosemide (LASIX) 20 MG tablet TAKE 1 TABLET BY MOUTH EVERY DAY AS NEEDED 30 tablet 90    HYDROcodone-acetaminophen (NORCO) 10-325 MG tablet Take 1 tablet by mouth every 4 (four) hours as needed (max 5 tabs/day). 150 tablet 0   levothyroxine (SYNTHROID) 137 MCG tablet Take 1 tablet (137 mcg total) by mouth daily before breakfast. 30 tablet 5   loratadine (CLARITIN) 10 MG tablet Take 10 mg by mouth daily.     meloxicam (MOBIC) 15 MG tablet Take 1 tablet (15 mg total) by mouth daily. 30 tablet 2   methadone (DOLOPHINE) 5 MG tablet Take 0.5 tablets (2.5 mg total) by mouth every 12 (twelve) hours. 30 tablet 0   OLANZapine (ZYPREXA) 10 MG tablet Take 1 tablet (10 mg total) by mouth at bedtime. 30 tablet 2   omeprazole (PRILOSEC) 40 MG capsule TAKE ONE CAPSULE BY MOUTH TWICE DAILY 60 capsule 2   ondansetron (ZOFRAN) 8 MG tablet TAKE 2 TABLETS BY MOUTH TWICE DAILY AS NEEDED (Patient taking differently: 8 mg 2 (two) times daily.) 60 tablet 0   OXYGEN Inhale 3 L into the lungs at bedtime. Uses At Night     potassium chloride (KLOR-CON M) 10 MEQ tablet TAKE 1 TABLET BY MOUTH AT BEDTIME 90 tablet 1   prochlorperazine (COMPAZINE) 10 MG tablet Take 1 tablet (10 mg total) by mouth every 6 (six) hours as needed for nausea or vomiting. 30 tablet 0   triamterene-hydrochlorothiazide (MAXZIDE-25) 37.5-25 MG tablet TAKE 1 TABLET BY MOUTH DAILY - EMERGENCY REFILL FAXED DR. 90 tablet 2   ALPRAZolam (XANAX) 1 MG tablet TAKE 1 TABLET BY MOUTH THREE TIMES DAILY AS NEEDED FOR ANXIETY 90 tablet 2   No current facility-administered medications for this visit.    REVIEW OF SYSTEMS:   Constitutional: ( - ) fevers, ( - )  chills , ( - ) night sweats Eyes: ( - ) blurriness of vision, ( - ) double vision, ( - ) watery eyes Ears, nose, mouth, throat, and face: ( - ) mucositis, ( - ) sore throat Respiratory: ( - ) cough, ( +) dyspnea, ( - )  wheezes Cardiovascular: ( - ) palpitation, ( - ) chest discomfort, ( + ) lower extremity swelling Gastrointestinal:  ( -) nausea, ( - ) heartburn, ( - ) change in bowel  habits Skin: ( - ) abnormal skin rashes Lymphatics: ( - ) new lymphadenopathy, ( - ) easy bruising Neurological: (+ ) numbness, ( - ) tingling, ( - ) new weaknesses Behavioral/Psych: ( - ) mood change, ( - ) new changes  All other systems were reviewed with the patient and are negative.  PHYSICAL EXAMINATION: ECOG PERFORMANCE STATUS: 1 - Symptomatic but completely ambulatory  Vitals:   08/29/21 1242  BP: 132/68  Pulse: 96  Resp: 18  Temp: (!) 97.2 F (36.2 C)  SpO2: 93%   Filed Weights   08/29/21 1242  Weight: 203 lb 12.8 oz (92.4 kg)    GENERAL: Well-appearing middle-age Caucasian female, alert, no distress and comfortable SKIN: skin color, texture, turgor are normal, no rashes or significant lesions EYES: conjunctiva are pink and non-injected, sclera clear LUNGS:  normal breathing effort. Diffuse wheezing hear on ausculation.  HEART: regular rate & rhythm and no murmurs. Mild bilateral lower extremity edema Musculoskeletal: no cyanosis of digits and no clubbing  PSYCH: alert & oriented x 3, fluent speech NEURO: no focal motor/sensory deficits  LABORATORY DATA:  I have reviewed the data as listed    Latest Ref Rng & Units 08/29/2021   12:11 PM 08/08/2021   10:14 AM 07/18/2021    1:56 PM  CBC  WBC 4.0 - 10.5 K/uL 8.8  9.1  8.0   Hemoglobin 12.0 - 15.0 g/dL 12.4  12.6  12.2   Hematocrit 36.0 - 46.0 % 38.8  40.0  38.5   Platelets 150 - 400 K/uL 186  171  172        Latest Ref Rng & Units 08/29/2021   12:11 PM 08/08/2021   10:14 AM 07/18/2021    1:56 PM  CMP  Glucose 70 - 99 mg/dL 82  107  97   BUN 8 - 23 mg/dL 13  9  12    Creatinine 0.44 - 1.00 mg/dL 1.02  0.88  0.89   Sodium 135 - 145 mmol/L 133  139  135   Potassium 3.5 - 5.1 mmol/L 4.0  3.8  3.6   Chloride 98 - 111 mmol/L 98  105  99   CO2 22 - 32 mmol/L 30  29  28    Calcium 8.9 - 10.3 mg/dL 8.4  8.9  8.3   Total Protein 6.5 - 8.1 g/dL 6.5  6.5  6.9   Total Bilirubin 0.3 - 1.2 mg/dL 0.4  0.5  0.6   Alkaline  Phos 38 - 126 U/L 92  78  83   AST 15 - 41 U/L 14  13  16    ALT 0 - 44 U/L 9  8  12      No results found for: "MPROTEIN" Lab Results  Component Value Date   KPAFRELGTCHN 0.75 06/30/2014   LAMBDASER 3.78 (H) 06/30/2014   KAPLAMBRATIO 0.20 (L) 06/30/2014     RADIOGRAPHIC STUDIES: DG Ribs Unilateral W/Chest Right  Result Date: 08/19/2021 CLINICAL DATA:  Right lower rib/right upper quadrant abdominal pain. EXAM: RIGHT RIBS AND CHEST - 3+ VIEW COMPARISON:  12/19/2020. FINDINGS: No fracture or other bone lesions are seen involving the ribs. There is no evidence of pneumothorax or pleural effusion. Both lungs are clear. Heart size and mediastinal contours are within normal limits. A right chest port terminates  over the superior vena cava. Surgical clips are present in the right upper quadrant. Degenerative changes are noted in the thoracolumbar spine. IMPRESSION: Negative. Electronically Signed   By: Brett Fairy M.D.   On: 08/19/2021 01:44   DG Knee Complete 4 Views Left  Result Date: 08/19/2021 CLINICAL DATA:  Acute on chronic left knee pain.  No known injury. EXAM: LEFT KNEE - COMPLETE 4+ VIEW COMPARISON:  04/29/2019. FINDINGS: No acute fracture or dislocation. There are mild-to-moderate tricompartmental degenerative changes, most pronounced in the lateral compartment. Chondrocalcinosis is noted. A moderate joint effusion is noted. The soft tissues are within normal limits IMPRESSION: 1. Tricompartmental degenerative changes. 2. Moderate joint effusion. 3. Chondrocalcinosis. Electronically Signed   By: Brett Fairy M.D.   On: 08/19/2021 01:42    ASSESSMENT & PLAN [name] 66 y.o. female with medical history significant for extensive stage small cell lung cancer who presents for a follow up visit.   After review of the labs, review of the records, and discussion with the patient the patients findings are most consistent with extensive stage small cell lung cancer with metastasis from  the right lower lobe to the lymph nodes of the abdomen.  At this time we will pursue triple therapy with carboplatin, etoposide, and atezolizumab.  After 4 cycles we will convert to maintenance atezolizumab alone.  We previously discussed the risks and benefits of this therapy and the patient was in agreement to proceed with this treatment.  The treatment of choice consist of carboplatin, etoposide, and atezolizumab.  The regimen consists of carboplatin AUC of 5 IV on day 1, etoposide 100 mg per metered squared IV on day 1, 2, and 3 and atezolizumab 1200 mg on day 1.  This continues for 21-day cycles.  After 4 cycles the patient proceeds with atezolizumab maintenance therapy alone.   #Small Cell Lung Cancer, Extensive Stage -- MRI of the brain shows no evidence of intracranial spread --Findings are currently consistent with metastatic small cell lung cancer with metastatic spread to the lymph nodes of the abdomen --Prognosis is poor with anticipated life span of less than 12 months -- Patient has extensive stage due to metastatic spread to lymph nodes in the abdomen --Plan to proceed with carboplatin, etoposide, and atezolizumab --will consider prophylactic cranial radiation after start of chemotherapy.  Plan: --today is Cycle 14 Day 1 of Atezolizumab maintenance --labs today were reviewed and adequate for treatment. Hgb 12.4, white blood cell count 8.8, and platelets of 186. No intervention needed. --CT scan q 3 months. Last scan in June 2023 showed continued excellent response to therapy. CT scan next in Sept 2023.  --RTC in 3 weeks for next cycle of maintenance atezolizumab    #Supportive Care -- chemotherapy education complete -- port placed -- zofran 8mg  q8H PRN and compazine 10mg  PO q6H for nausea -- EMLA cream for port -- Will add mirtazapine in order to help with appetite/sleep  #Nausea: --Symptoms improved with zofran and olanzapine.  --Added IV zofran to infusion premeds for  better symptom control.   #Hypokalemia:  --currently taking potassium chloride 10 mEq at bedtime --potassium level is 4.0 today. No further intervention is needed.  #Lower extremity edema: --Currently takes lasix 20 mg as needed. Okay to take once daily x 5-7 days for help improve edema --Recommend to follow up with PCP   #Neuropathy involving fingers/feet: --Patient currently takes Cymbalta   No orders of the defined types were placed in this encounter.  All questions were  answered. The patient knows to call the clinic with any problems, questions or concerns.  I have spent a total of 30 minutes minutes of face-to-face and non-face-to-face time, preparing to see the patient, performing a medically appropriate examination, counseling and educating the patient, ordering medications/tests, documenting clinical information in the electronic health record, and care coordination.   Dede Query PA-C Dept of Hematology and [place] at [facility] Phone: [phone]   08/30/2021 10:37 AM

## 2021-09-03 ENCOUNTER — Other Ambulatory Visit: Payer: Self-pay | Admitting: Hematology and Oncology

## 2021-09-03 ENCOUNTER — Other Ambulatory Visit: Payer: Self-pay | Admitting: Nurse Practitioner

## 2021-09-05 ENCOUNTER — Telehealth: Payer: Self-pay | Admitting: Physician Assistant

## 2021-09-05 ENCOUNTER — Encounter: Payer: Self-pay | Admitting: Physical Medicine and Rehabilitation

## 2021-09-05 NOTE — Telephone Encounter (Signed)
Called pt and advised Alyssa recommendations; pt verbalized understanding. Pt also asked about referral and advised Dr Sebastian Ache office phone number since she hasn't heard from them regarding being scheduled.

## 2021-09-05 NOTE — Telephone Encounter (Signed)
Patient states: - She went to her treatment with her oncologist who she told about her continued LE swelling.  - She has been taking her lasix 20 mg but this has not helped  - Oncologist told her to ask PCP about increasing medication if it continues a week after treatment  Patient requests: -Instruction on if she should increase dosage or try something else - Not to come into office unless she absolutely has to   Please Advise.

## 2021-09-05 NOTE — Telephone Encounter (Signed)
Please see patient call message and advise

## 2021-09-05 NOTE — Telephone Encounter (Signed)
Patient wanted to inform Allwardt that she does have an ENT visit on Monday at 1:30pm

## 2021-09-06 ENCOUNTER — Other Ambulatory Visit: Payer: Self-pay

## 2021-09-06 DIAGNOSIS — R609 Edema, unspecified: Secondary | ICD-10-CM

## 2021-09-06 MED ORDER — FUROSEMIDE 20 MG PO TABS: ORAL_TABLET | ORAL | 0 refills | Status: DC

## 2021-09-06 NOTE — Telephone Encounter (Signed)
Please see pt message as FYI  

## 2021-09-07 MED ORDER — METHADONE HCL 5 MG PO TABS: 5.0000 mg | ORAL_TABLET | Freq: Two times a day (BID) | ORAL | 0 refills | Status: DC

## 2021-09-10 DIAGNOSIS — B3789 Other sites of candidiasis: Secondary | ICD-10-CM | POA: Diagnosis not present

## 2021-09-10 DIAGNOSIS — H9193 Unspecified hearing loss, bilateral: Secondary | ICD-10-CM | POA: Diagnosis not present

## 2021-09-10 DIAGNOSIS — R490 Dysphonia: Secondary | ICD-10-CM | POA: Diagnosis not present

## 2021-09-17 ENCOUNTER — Telehealth: Payer: Self-pay | Admitting: Pharmacist

## 2021-09-17 NOTE — Progress Notes (Unsigned)
Chronic Care Management Pharmacy Assistant   Name: [name]  MRN: [id number] DOB: [birth year]   Reason for Encounter: General Adherence Call    Recent office visits:  08/17/2021 [facility] (PCP) Allwardt, Randa Evens, PA-C; Pt requesting to start back on Meloxicam; known arthritis. Will update XRAY today.   Recent consult visits:  08/29/2021 [facility] (Oncology) Lincoln Brigham, PA-C; -- Will add mirtazapine in order to help with appetite/sleep  Hospital visits:  None in previous 6 months  Medications: Outpatient Encounter Medications as of 09/17/2021  Medication Sig Note   albuterol (PROAIR HFA) 108 (90 Base) MCG/ACT inhaler 2 puffs every 4 hours as needed only  if your can't catch your breath    ALPRAZolam (XANAX) 1 MG tablet TAKE 1 TABLET BY MOUTH THREE TIMES DAILY AS NEEDED FOR ANXIETY    atorvastatin (LIPITOR) 20 MG tablet TAKE 1 TABLET BY MOUTH AT BEDTIME    benzonatate (TESSALON) 100 MG capsule TAKE 1 CAPSULE BY MOUTH THREE TIMES DAILY    Budeson-Glycopyrrol-Formoterol (BREZTRI AEROSPHERE) 160-9-4.8 MCG/ACT AERO Inhale 2 puffs into the lungs in the morning and at bedtime. 06/27/2021: Will finish trelegy in 17 days   cyclobenzaprine (FLEXERIL) 10 MG tablet Take 1 tablet (10 mg total) by mouth 3 (three) times daily as needed for muscle spasms.    DULoxetine (CYMBALTA) 60 MG capsule TAKE TWO CAPSULES BY MOUTH DAILY    fluticasone (FLONASE) 50 MCG/ACT nasal spray INSTILL 2 SPRAYS IN EACH NOSTRIL EVERY DAY    fluticasone-salmeterol (ADVAIR DISKUS) 250-50 MCG/ACT AEPB Inhale 1 puff into the lungs in the morning and at bedtime.    furosemide (LASIX) 20 MG tablet Take 20 mg BID for 7 days then continue to take 1- 20mg  tablet daily    HYDROcodone-acetaminophen (NORCO) 10-325 MG tablet Take 1 tablet by mouth every 4 (four) hours as needed (max 5 tabs/day).    levothyroxine (SYNTHROID) 137 MCG tablet Take 1 tablet (137 mcg total) by mouth daily before breakfast.    loratadine (CLARITIN) 10 MG tablet  Take 10 mg by mouth daily.    meloxicam (MOBIC) 15 MG tablet Take 1 tablet (15 mg total) by mouth daily.    methadone (DOLOPHINE) 5 MG tablet Take 1 tablet (5 mg total) by mouth every 12 (twelve) hours.    OLANZapine (ZYPREXA) 10 MG tablet Take 1 tablet (10 mg total) by mouth at bedtime.    omeprazole (PRILOSEC) 40 MG capsule TAKE ONE CAPSULE BY MOUTH TWICE DAILY    ondansetron (ZOFRAN) 8 MG tablet TAKE 2 TABLETS BY MOUTH TWICE DAILY AS NEEDED    OXYGEN Inhale 3 L into the lungs at bedtime. Uses At Night 11/13/2020: Uses 3l/min [place] @ night   potassium chloride (KLOR-CON M) 10 MEQ tablet TAKE 1 TABLET BY MOUTH AT BEDTIME    prochlorperazine (COMPAZINE) 10 MG tablet Take 1 tablet (10 mg total) by mouth every 6 (six) hours as needed for nausea or vomiting.    triamterene-hydrochlorothiazide (MAXZIDE-25) 37.5-25 MG tablet TAKE 1 TABLET BY MOUTH DAILY - EMERGENCY REFILL FAXED DR.    No facility-administered encounter medications on file as of 09/17/2021.   [facility] for General Review Call   Chart Review:  Have there been any documented new, changed, or discontinued medications since last visit? Yes  Has there been any documented recent hospitalizations or ED visits since last visit with Clinical Pharmacist? No Brief Summary: Patient restarted Meloxicam for pain. She also started Mirtazapine for sleep and appetite. Patient  states she hasn't had to take Mirtazapine much as she has been sleeping well on her own.   Adherence Review:  Does the Clinical Pharmacist Assistant have access to adherence rates? Yes Adherence rates for STAR metric medications: Atorvastatin 20 mg last filled 08/01/2021 90 DS Does the patient have >5 day gap between last estimated fill dates for any of the above medications or other medication gaps? No Reason for medication gaps.   Disease State Questions:  Able to connect with Patient? Yes Did patient have any problems with their health recently?  Yes Note problems and Concerns: Patient states she has been feeling tired lately due to her cancer treatments. She states she has to make herself want to do anything. Have you had any admissions or emergency room visits or worsening of your condition(s) since last visit? No Have you had any visits with new specialists or providers since your last visit? No Have you had any new health care problem(s) since your last visit? No Have you run out of any of your medications since you last spoke with clinical pharmacist? No Are there any medications you are not taking as prescribed? No Are you having any issues or side effects with your medications? No Note of issues or side effects: Patient states her "cancer treatment" makes her tired. Do you have any other health concerns or questions you want to discuss with your Clinical Pharmacist before your next visit? No Are there any health concerns that you feel we can do a better job addressing? No Are you having any problems with any of the following since the last visit: (select all that apply)  None 12. Any falls since last visit? No 13. Any increased or uncontrolled pain since last visit? Yes  Details: Methadone increased to 5 mg twice daily on 09/05/2021 due to uncontrolled pain. Patient has diagnosis of small cell lung cancer.  Care Gaps: Medicare Annual Wellness: Completed 05/07/2021 Ophthalmology Exam: [name] - never done Hemoglobin A1C: 6.5% on 01/05/2021 Colonoscopy: Next due on 07/25/2027 Dexa Scan: Completed Mammogram: Next due on 05/04/2022  Future Appointments  Date Time Provider [place]  09/19/2021  9:45 AM [facility] [place] [facility] None  09/19/2021 10:20 AM Dede Query T, PA-C [facility] None  09/19/2021 11:00 AM [facility] INFUSION [facility] None  10/10/2021 10:15 AM [facility] [place] FLUSH [facility] None  10/10/2021 10:40 AM Orson Slick, MD [facility] None  10/10/2021 11:30 AM [facility] INFUSION [facility] None   10/10/2021 12:00 PM Morrell Riddle, RD [facility] None  10/22/2021  3:20 PM Courtney Heys, MD [facility] CPR  10/31/2021  9:45 AM [facility] [place] FLUSH [facility] None  10/31/2021 10:20 AM Ledell Peoples IV, MD [facility] None  10/31/2021 11:15 AM [facility] INFUSION [facility] None  11/12/2021  3:30 PM [facility] CCM PHARMACIST [facility]  05/16/2022  1:30 PM [facility] HEALTH COACH [facility]   Star Rating Drugs: Atorvastatin 20 mg last filled 08/01/2021 90 DS  April D Calhoun, [facility] Pharmacist Assistant [phone]

## 2021-09-19 ENCOUNTER — Inpatient Hospital Stay: Payer: Medicare Other

## 2021-09-19 ENCOUNTER — Other Ambulatory Visit: Payer: Self-pay

## 2021-09-19 ENCOUNTER — Inpatient Hospital Stay (HOSPITAL_BASED_OUTPATIENT_CLINIC_OR_DEPARTMENT_OTHER): Payer: Medicare Other | Admitting: Physician Assistant

## 2021-09-19 VITALS — BP 140/78 | HR 88 | Temp 97.2°F | Resp 16 | Wt 198.3 lb

## 2021-09-19 VITALS — BP 122/71 | HR 87 | Temp 98.3°F | Resp 16

## 2021-09-19 DIAGNOSIS — Z880 Allergy status to penicillin: Secondary | ICD-10-CM | POA: Diagnosis not present

## 2021-09-19 DIAGNOSIS — G8929 Other chronic pain: Secondary | ICD-10-CM | POA: Diagnosis not present

## 2021-09-19 DIAGNOSIS — Z79899 Other long term (current) drug therapy: Secondary | ICD-10-CM | POA: Diagnosis not present

## 2021-09-19 DIAGNOSIS — Z8719 Personal history of other diseases of the digestive system: Secondary | ICD-10-CM | POA: Diagnosis not present

## 2021-09-19 DIAGNOSIS — E785 Hyperlipidemia, unspecified: Secondary | ICD-10-CM | POA: Diagnosis not present

## 2021-09-19 DIAGNOSIS — R600 Localized edema: Secondary | ICD-10-CM | POA: Diagnosis not present

## 2021-09-19 DIAGNOSIS — Z5112 Encounter for antineoplastic immunotherapy: Secondary | ICD-10-CM | POA: Diagnosis not present

## 2021-09-19 DIAGNOSIS — R0600 Dyspnea, unspecified: Secondary | ICD-10-CM | POA: Diagnosis not present

## 2021-09-19 DIAGNOSIS — F32A Depression, unspecified: Secondary | ICD-10-CM | POA: Diagnosis not present

## 2021-09-19 DIAGNOSIS — R519 Headache, unspecified: Secondary | ICD-10-CM | POA: Diagnosis not present

## 2021-09-19 DIAGNOSIS — Z95828 Presence of other vascular implants and grafts: Secondary | ICD-10-CM

## 2021-09-19 DIAGNOSIS — Z882 Allergy status to sulfonamides status: Secondary | ICD-10-CM | POA: Diagnosis not present

## 2021-09-19 DIAGNOSIS — C3490 Malignant neoplasm of unspecified part of unspecified bronchus or lung: Secondary | ICD-10-CM | POA: Diagnosis not present

## 2021-09-19 DIAGNOSIS — R110 Nausea: Secondary | ICD-10-CM | POA: Diagnosis not present

## 2021-09-19 DIAGNOSIS — F17210 Nicotine dependence, cigarettes, uncomplicated: Secondary | ICD-10-CM | POA: Diagnosis not present

## 2021-09-19 DIAGNOSIS — E876 Hypokalemia: Secondary | ICD-10-CM | POA: Diagnosis not present

## 2021-09-19 DIAGNOSIS — G629 Polyneuropathy, unspecified: Secondary | ICD-10-CM | POA: Diagnosis not present

## 2021-09-19 DIAGNOSIS — M7989 Other specified soft tissue disorders: Secondary | ICD-10-CM | POA: Diagnosis not present

## 2021-09-19 DIAGNOSIS — J449 Chronic obstructive pulmonary disease, unspecified: Secondary | ICD-10-CM | POA: Diagnosis not present

## 2021-09-19 DIAGNOSIS — C3431 Malignant neoplasm of lower lobe, right bronchus or lung: Secondary | ICD-10-CM | POA: Diagnosis not present

## 2021-09-19 DIAGNOSIS — Z886 Allergy status to analgesic agent status: Secondary | ICD-10-CM | POA: Diagnosis not present

## 2021-09-19 DIAGNOSIS — R197 Diarrhea, unspecified: Secondary | ICD-10-CM | POA: Diagnosis not present

## 2021-09-19 DIAGNOSIS — Z885 Allergy status to narcotic agent status: Secondary | ICD-10-CM | POA: Diagnosis not present

## 2021-09-19 DIAGNOSIS — E119 Type 2 diabetes mellitus without complications: Secondary | ICD-10-CM | POA: Diagnosis not present

## 2021-09-19 LAB — CBC WITH DIFFERENTIAL (CANCER CENTER ONLY)
Abs Immature Granulocytes: 0.05 10*3/uL (ref 0.00–0.07)
Basophils Absolute: 0 10*3/uL (ref 0.0–0.1)
Basophils Relative: 0 %
Eosinophils Absolute: 0.5 10*3/uL (ref 0.0–0.5)
Eosinophils Relative: 6 %
HCT: 40.2 % (ref 36.0–46.0)
Hemoglobin: 13.2 g/dL (ref 12.0–15.0)
Immature Granulocytes: 1 %
Lymphocytes Relative: 28 %
Lymphs Abs: 2.5 10*3/uL (ref 0.7–4.0)
MCH: 27.6 pg (ref 26.0–34.0)
MCHC: 32.8 g/dL (ref 30.0–36.0)
MCV: 84.1 fL (ref 80.0–100.0)
Monocytes Absolute: 0.7 10*3/uL (ref 0.1–1.0)
Monocytes Relative: 7 %
Neutro Abs: 5.2 10*3/uL (ref 1.7–7.7)
Neutrophils Relative %: 58 %
Platelet Count: 219 10*3/uL (ref 150–400)
RBC: 4.78 MIL/uL (ref 3.87–5.11)
RDW: 18.6 % — ABNORMAL HIGH (ref 11.5–15.5)
WBC Count: 9 10*3/uL (ref 4.0–10.5)
nRBC: 0 % (ref 0.0–0.2)

## 2021-09-19 LAB — CMP (CANCER CENTER ONLY)
ALT: 9 U/L (ref 0–44)
AST: 13 U/L — ABNORMAL LOW (ref 15–41)
Albumin: 3.7 g/dL (ref 3.5–5.0)
Alkaline Phosphatase: 87 U/L (ref 38–126)
Anion gap: 6 (ref 5–15)
BUN: 14 mg/dL (ref 8–23)
CO2: 29 mmol/L (ref 22–32)
Calcium: 8.7 mg/dL — ABNORMAL LOW (ref 8.9–10.3)
Chloride: 101 mmol/L (ref 98–111)
Creatinine: 1.03 mg/dL — ABNORMAL HIGH (ref 0.44–1.00)
GFR, Estimated: >60 mL/min (ref >60)
Glucose, Bld: 101 mg/dL — ABNORMAL HIGH (ref 70–99)
Potassium: 3.6 mmol/L (ref 3.5–5.1)
Sodium: 136 mmol/L (ref 135–145)
Total Bilirubin: 0.4 mg/dL (ref 0.3–1.2)
Total Protein: 6.5 g/dL (ref 6.5–8.1)

## 2021-09-19 LAB — TSH: TSH: 1.966 u[IU]/mL (ref 0.350–4.500)

## 2021-09-19 MED ORDER — SODIUM CHLORIDE 0.9 % IV SOLN
1200.0000 mg | Freq: Once | INTRAVENOUS | Status: AC
  Administered 2021-09-19: 1200 mg via INTRAVENOUS
  Filled 2021-09-19: qty 20

## 2021-09-19 MED ORDER — SODIUM CHLORIDE 0.9 % IV SOLN: 8.0000 mg | Freq: Once | INTRAVENOUS | Status: DC

## 2021-09-19 MED ORDER — SODIUM CHLORIDE 0.9% FLUSH
10.0000 mL | Freq: Once | INTRAVENOUS | Status: AC
  Administered 2021-09-19: 10 mL

## 2021-09-19 MED ORDER — ONDANSETRON HCL 4 MG/2ML IJ SOLN
8.0000 mg | Freq: Once | INTRAMUSCULAR | Status: AC
  Administered 2021-09-19: 8 mg via INTRAVENOUS
  Filled 2021-09-19: qty 4

## 2021-09-19 MED ORDER — HEPARIN SOD (PORK) LOCK FLUSH 100 UNIT/ML IV SOLN
500.0000 [IU] | Freq: Once | INTRAVENOUS | Status: AC | PRN
  Administered 2021-09-19: 500 [IU]

## 2021-09-19 MED ORDER — SODIUM CHLORIDE 0.9 % IV SOLN: Freq: Once | INTRAVENOUS | Status: AC

## 2021-09-19 MED ORDER — SODIUM CHLORIDE 0.9% FLUSH
10.0000 mL | INTRAVENOUS | Status: DC | PRN
  Administered 2021-09-19: 10 mL

## 2021-09-19 NOTE — Patient Instructions (Signed)
[facility]  Discharge Instructions: Thank you for choosing [facility] to provide your oncology and hematology care.   If you have a lab appointment with the [facility], please go directly to the [facility] and check in at the registration area.   Wear comfortable clothing and clothing appropriate for easy access to any Portacath or PICC line.   We strive to give you quality time with your provider. You may need to reschedule your appointment if you arrive late (15 or more minutes).  Arriving late affects you and other patients whose appointments are after yours.  Also, if you miss three or more appointments without notifying the office, you may be dismissed from the clinic at the provider's discretion.      For prescription refill requests, have your pharmacy contact our office and allow 72 hours for refills to be completed.    Today you received the following chemotherapy and/or immunotherapy agents: Tecentriq      To help prevent nausea and vomiting after your treatment, we encourage you to take your nausea medication as directed.  BELOW ARE SYMPTOMS THAT SHOULD BE REPORTED IMMEDIATELY: *FEVER GREATER THAN 100.4 F (38 C) OR HIGHER *CHILLS OR SWEATING *NAUSEA AND VOMITING THAT IS NOT CONTROLLED WITH YOUR NAUSEA MEDICATION *UNUSUAL SHORTNESS OF BREATH *UNUSUAL BRUISING OR BLEEDING *URINARY PROBLEMS (pain or burning when urinating, or frequent urination) *BOWEL PROBLEMS (unusual diarrhea, constipation, pain near the anus) TENDERNESS IN MOUTH AND THROAT WITH OR WITHOUT PRESENCE OF ULCERS (sore throat, sores in mouth, or a toothache) UNUSUAL RASH, SWELLING OR PAIN  UNUSUAL VAGINAL DISCHARGE OR ITCHING   Items with * indicate a potential emergency and should be followed up as soon as possible or go to the Emergency Department if any problems should occur.  Please show the CHEMOTHERAPY ALERT CARD or IMMUNOTHERAPY ALERT CARD at check-in to  the Emergency Department and triage nurse.  Should you have questions after your visit or need to cancel or reschedule your appointment, please contact [facility]  Dept: [phone]  and follow the prompts.  Office hours are 8:00 a.m. to 4:30 p.m. Monday - Friday. Please note that voicemails left after 4:00 p.m. may not be returned until the following business day.  We are closed weekends and major holidays. You have access to a nurse at all times for urgent questions. Please call the main number to the clinic Dept: [phone] and follow the prompts.   For any non-urgent questions, you may also contact your provider using MyChart. We now offer e-Visits for anyone 5 and older to request care online for non-urgent symptoms. For details visit [email or web address].   Also download the MyChart app! Go to the app store, search "MyChart", open the app, select [facility], and log in with your MyChart username and password.  Masks are optional in the cancer centers. If you would like for your care team to wear a mask while they are taking care of you, please let them know. You may have one support person who is at least 65 years old accompany you for your appointments.

## 2021-09-19 NOTE — Progress Notes (Signed)
[place] Telephone:([phone]) [phone]   Fax:(336) [phone]  PROGRESS NOTE  Patient Care Team: Allwardt, Randa Evens, PA-C as PCP - General (Physician Assistant) Milus Banister, MD as Attending Physician (Gastroenterology) Dr. Darylene Price, MD as Consulting Physician (Orthopedic Surgery) Latanya Maudlin, MD as Consulting Physician (Orthopedic Surgery) Kathrynn Ducking, MD (Inactive) as Consulting Physician (Neurology) Okey Regal, [place] as Consulting Physician (Optometry) Delice Lesch Lezlie Octave, MD as Consulting Physician (Neurology) Edythe Clarity, [facility] (Pharmacist)  Hematological/Oncological History # Small Cell Lung Cancer, Extensive Stage 07/05/2020: CT abdomen for lower abdominal pain. New pulmonary nodular density noted 07/06/2020: CT chest showed 2.2 cm macrolobulated right lower lobe pulmonary nodule (favored) versus pathologically enlarged infrahilar lymph node 10/13/2020: PET CT scan performed, findings show 2 cm right lower lobe lung mass is hypermetabolic and consistent with primary lung neoplasm. Additionally found hypermetabolic 17 mm soft tissue lesion between the descending duodenum and the pancreatic head  10/19/2020: EGD to biopsy hypermetabolic lymph node. Biopsy results show small cell lung cancer 10/26/2020: establish care with Dr. Lorenso Courier  11/13/2020: Cycle 1 Day 1 of Carbo/Etop/Atezolizumab 12/04/2020: Cycle 2 Day 1 of Carbo/Etop/Atezolizumab 11/22-11/25/2022: admitted for E. Coli bacteremia/sepsis. Start of Cycle 3 delayed. 01/02/2021: Cycle 3 Day 1 of Carbo/Etop/Atezolizumab 01/23/2021: Cycle 4 Day 1 of Carbo/Etop/Atezolizumab 02/22/2021: Cycle 5 Day 1 of Atezolizumab Maintenance. Delayed start due to patient's COVID infection.  03/14/2021: Cycle 6 Day 1 of Atezolizumab Maintenance 04/05/2021: Cycle 7 Day 1 of Atezolizumab Maintenance 04/25/2021: Cycle 8 Day 1 of Atezolizumab Maintenance 05/16/2021: Cycle 9 Day 1 of Atezolizumab Maintenance 06/06/2021:  Cycle 10 Day 1 of Atezolizumab Maintenance 06/27/2021:  Cycle 11 Day 1 of Atezolizumab Maintenance 07/18/2021: Cycle 12 Day 1 of Atezolizumab Maintenance 08/08/2021: Cycle 13 Day 1 of Atezolizumab Maintenance 08/29/2021: Cycle 14 Day 1 of Atezolizumab Maintenance 09/19/2021: Cycle 15 Day 1 of Atezolizumab Maintenance  Interval History:  [name] 66 y.o. female with medical history significant for extensive stage small cell lung cancer who presents for a follow up visit. The patient's last visit was on 08/29/2021. In the interim since the last visit she has completed cycle 14 of chemotherapy. She presents today to start cycle 15 of chemotherapy.  On exam today [name] reports her energy levels and appetite are stable.  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  She reports improvement of her lower extremity edema after taking Lasix 20 mg twice a day for 7 days and then decreasing back to 20 mg once a day.  She denies any nausea, vomiting or abdominal pain.  Her bowel habits with some loose stools.She denies any bruising or active bleeding.  She denies fevers, chills, night sweats, chest pain or cough.She has no other complaints. A full 10 point ROS is listed below.   MEDICAL HISTORY:  Past Medical History:  Diagnosis Date   Allergic rhinitis    Anemia    Anxiety    Chicken pox    Chronic back pain    COPD (chronic obstructive pulmonary disease) (HCC)    Depression    DM (diabetes mellitus) ([place])    Essential hypertension    GERD (gastroesophageal reflux disease)    Headache    migraines   History of gastritis    EGD 2015   History of home oxygen therapy    2 liters at hs last 6 months   Hyperlipidemia    Hypothyroidism    Migraines    Osteoarthritis    oa   Scoliosis  SURGICAL HISTORY: Past Surgical History:  Procedure Laterality Date   APPENDECTOMY     1985   BIOPSY  07/24/2017   Procedure: BIOPSY;  Surgeon: Milus Banister, MD;  Location: [facility];  Service:  Endoscopy;;   CARDIAC CATHETERIZATION N/A 10/31/2015   Procedure: Left Heart Cath and Coronary Angiography;  Surgeon: Leonie Man, MD;  Location: [facility];  Service: Cardiovascular;  Laterality: N/A;   CARPAL TUNNEL RELEASE Left    CARPAL TUNNEL RELEASE Right    CHOLECYSTECTOMY  late 1980's   COLONOSCOPY WITH PROPOFOL N/A 07/24/2017   Procedure: COLONOSCOPY WITH PROPOFOL;  Surgeon: Milus Banister, MD;  [facility]: [facility];  Service: Endoscopy;  Laterality: N/A;   ESOPHAGOGASTRODUODENOSCOPY N/A 07/24/2017   Procedure: ESOPHAGOGASTRODUODENOSCOPY (EGD);  Surgeon: Milus Banister, MD;  Location: [facility];  Service: Endoscopy;  Laterality: N/A;   ESOPHAGOGASTRODUODENOSCOPY (EGD) WITH PROPOFOL N/A 10/19/2020   Procedure: ESOPHAGOGASTRODUODENOSCOPY (EGD) WITH PROPOFOL;  Surgeon: Milus Banister, MD;  [facility]: [facility];  Service: Endoscopy;  Laterality: N/A;   FINE NEEDLE ASPIRATION N/A 10/19/2020   Procedure: FINE NEEDLE ASPIRATION (FNA) LINEAR;  Surgeon: Milus Banister, MD;  Location: [facility];  Service: Endoscopy;  Laterality: N/A;   GALLBLADDER SURGERY  1991   HIP CLOSED REDUCTION Right 01/08/2016   Procedure: CLOSED MANIPULATION HIP;  Surgeon: Susa Day, MD;  Location: [facility];  Service: Orthopedics;  Laterality: Right;   HIP CLOSED REDUCTION Right 01/19/2016   Procedure: ATTEMPTED CLOSED REDUCTION RIGHT HIP;  Surgeon: Wylene Simmer, MD;  Location: [facility];  Service: Orthopedics;  Laterality: Right;   HIP CLOSED REDUCTION Right 01/20/2016   Procedure: CLOSED REDUCTION RIGHT TOTAL HIP;  Surgeon: Paralee Cancel, MD;  Location: [facility];  Service: Orthopedics;  Laterality: Right;   HIP CLOSED REDUCTION Right 02/17/2016   Procedure: CLOSED REDUCTION RIGHT TOTAL HIP;  Surgeon: Rod Can, MD;  Location: [facility];  Service: Orthopedics;  Laterality: Right;   HIP CLOSED REDUCTION Right 02/28/2016   Procedure: CLOSED REDUCTION HIP;  Surgeon: Nicholes Stairs, MD;  Location:  [facility];  Service: Orthopedics;  Laterality: Right;   IR IMAGING GUIDED PORT INSERTION  11/01/2020   POLYPECTOMY  07/24/2017   Procedure: POLYPECTOMY;  Surgeon: Milus Banister, MD;  Location: [facility];  Service: Endoscopy;;   [place], with 1 ovary removed and 2 nd ovary removed 2003   TOTAL HIP ARTHROPLASTY Right    Original surgery 2006 with revision 2010   TOTAL HIP REVISION Right 01/01/2016   Procedure: TOTAL HIP REVISION;  Surgeon: Paralee Cancel, MD;  Location: [facility];  Service: Orthopedics;  Laterality: Right;   TOTAL HIP REVISION Right 03/02/2016   Procedure: TOTAL HIP REVISION;  Surgeon: Paralee Cancel, MD;  Location: [facility];  Service: Orthopedics;  Laterality: Right;   TOTAL HIP REVISION Right 09/02/2016   Procedure: Right hip constrained liner- posterior;  Surgeon: Paralee Cancel, MD;  Location: [facility];  Service: Orthopedics;  Laterality: Right;   ULNAR NERVE TRANSPOSITION Right    UPPER ESOPHAGEAL ENDOSCOPIC ULTRASOUND (EUS) N/A 10/19/2020   Procedure: UPPER ESOPHAGEAL ENDOSCOPIC ULTRASOUND (EUS);  Surgeon: Milus Banister, MD;  Location: [facility];  Service: Endoscopy;  Laterality: N/A;  periduodenal lesion    SOCIAL HISTORY: Social History   Socioeconomic History   Marital status: Married    Spouse name: Not on file   Number of children: 2   Years of education: Not  on file   Highest education level: Not on file  Occupational History   Occupation: disabled   Occupation: disabled  Tobacco Use   Smoking status: Every Day    Packs/day: 2.00    Years: 46.00    Total pack years: 92.00    Types: Cigarettes   Smokeless tobacco: Never   Tobacco comments:    2 packs of cigarettes smoked daily ARJ 04/10/21  Vaping Use   Vaping Use: Never used  Substance and Sexual Activity   Alcohol use: No   Drug use: No   Sexual activity: Not Currently    Partners: Male  Other Topics Concern   Not on file  Social History Narrative    Right handed    Caffeine~ 2 cups per day    Lives at home with husband (strained relationship)   Primary caretaker for disabled brother who had aneurism   Daughter died 06-21-18    Social Determinants of Health   Financial Resource Strain: Medium Risk (05/03/2021)   Overall Financial Resource Strain (CARDIA)    Difficulty of Paying Living Expenses: Somewhat hard  Food Insecurity: No Food Insecurity (05/03/2021)   Hunger Vital Sign    Worried About Running Out of Food in the Last Year: Never true    Ran Out of Food in the Last Year: Never true  Transportation Needs: No Transportation Needs (12/27/2019)   PRAPARE - Hydrologist (Medical): No    Lack of Transportation (Non-Medical): No  Physical Activity: Inactive (05/03/2021)   Exercise Vital Sign    Days of Exercise per Week: 0 days    Minutes of Exercise per Session: 0 min  Stress: Stress Concern Present (05/03/2021)   [place]    Feeling of Stress : To some extent  Social Connections: Moderately Integrated (05/03/2021)   Social Connection and Isolation Panel [NHANES]    Frequency of Communication with Friends and Family: More than three times a week    Frequency of Social Gatherings with Friends and Family: Once a week    Attends Religious Services: 1 to 4 times per year    Active Member of [employer] or Organizations: No    Attends Archivist Meetings: Never    Marital Status: Married  Human resources officer Violence: Not At Risk (05/03/2021)   Humiliation, Afraid, Rape, and Kick questionnaire    Fear of Current or Ex-Partner: No    Emotionally Abused: No    Physically Abused: No    Sexually Abused: No    FAMILY HISTORY: Family History  Problem Relation Age of Onset   COPD Mother    Heart disease Mother    Lung disease Father        Asbestosis   Heart attack Father    Heart disease Father    Cerebral aneurysm Brother    Aneurysm  Brother        Brain   Drug abuse Daughter    Epilepsy Son    Alcohol abuse Son    Drug abuse Son    Arthritis Maternal Grandmother    Heart disease Maternal Grandmother    Asthma Maternal Grandfather    Cancer Maternal Grandfather    Arthritis Paternal Grandmother    Heart disease Paternal Grandmother    Stroke Paternal Grandmother    Early death Paternal Grandfather    Heart disease Paternal Grandfather     ALLERGIES:  is allergic to metformin and related,  nsaids, wellbutrin [bupropion], aleve [naproxen sodium], codeine, penicillins, and sulfonamide derivatives.  MEDICATIONS:  Current Outpatient Medications  Medication Sig Dispense Refill   albuterol (PROAIR HFA) 108 (90 Base) MCG/ACT inhaler 2 puffs every 4 hours as needed only  if your can't catch your breath 18 g 3   ALPRAZolam (XANAX) 1 MG tablet TAKE 1 TABLET BY MOUTH THREE TIMES DAILY AS NEEDED FOR ANXIETY 90 tablet 2   atorvastatin (LIPITOR) 20 MG tablet TAKE 1 TABLET BY MOUTH AT BEDTIME 90 tablet 1   benzonatate (TESSALON) 100 MG capsule TAKE 1 CAPSULE BY MOUTH THREE TIMES DAILY 30 capsule 1   Budeson-Glycopyrrol-Formoterol (BREZTRI AEROSPHERE) 160-9-4.8 MCG/ACT AERO Inhale 2 puffs into the lungs in the morning and at bedtime. 10.7 g 3   cyclobenzaprine (FLEXERIL) 10 MG tablet Take 1 tablet (10 mg total) by mouth 3 (three) times daily as needed for muscle spasms. 90 tablet 5   DULoxetine (CYMBALTA) 60 MG capsule TAKE TWO CAPSULES BY MOUTH DAILY 60 capsule 5   fluticasone (FLONASE) 50 MCG/ACT nasal spray INSTILL 2 SPRAYS IN EACH NOSTRIL EVERY DAY 16 g 6   fluticasone-salmeterol (ADVAIR DISKUS) 250-50 MCG/ACT AEPB Inhale 1 puff into the lungs in the morning and at bedtime. 60 each 5   furosemide (LASIX) 20 MG tablet Take 20 mg BID for 7 days then continue to take 1- 20mg  tablet daily 37 tablet 0   HYDROcodone-acetaminophen (NORCO) 10-325 MG tablet Take 1 tablet by mouth every 4 (four) hours as needed (max 5 tabs/day). 150  tablet 0   levothyroxine (SYNTHROID) 137 MCG tablet Take 1 tablet (137 mcg total) by mouth daily before breakfast. 30 tablet 5   loratadine (CLARITIN) 10 MG tablet Take 10 mg by mouth daily.     meloxicam (MOBIC) 15 MG tablet Take 1 tablet (15 mg total) by mouth daily. 30 tablet 2   OLANZapine (ZYPREXA) 10 MG tablet Take 1 tablet (10 mg total) by mouth at bedtime. 30 tablet 2   omeprazole (PRILOSEC) 40 MG capsule TAKE ONE CAPSULE BY MOUTH TWICE DAILY 60 capsule 2   ondansetron (ZOFRAN) 8 MG tablet TAKE 2 TABLETS BY MOUTH TWICE DAILY AS NEEDED 60 tablet 0   OXYGEN Inhale 3 L into the lungs at bedtime. Uses At Night     potassium chloride (KLOR-CON M) 10 MEQ tablet TAKE 1 TABLET BY MOUTH AT BEDTIME 90 tablet 1   prochlorperazine (COMPAZINE) 10 MG tablet Take 1 tablet (10 mg total) by mouth every 6 (six) hours as needed for nausea or vomiting. 30 tablet 0   triamterene-hydrochlorothiazide (MAXZIDE-25) 37.5-25 MG tablet TAKE 1 TABLET BY MOUTH DAILY - EMERGENCY REFILL FAXED DR. 90 tablet 2   methadone (DOLOPHINE) 5 MG tablet Take 1 tablet (5 mg total) by mouth every 12 (twelve) hours. (Patient not taking: Reported on 09/19/2021) 60 tablet 0   No current facility-administered medications for this visit.   Facility-Administered Medications Ordered in Other Visits  Medication Dose Route Frequency Provider Last Rate Last Admin   atezolizumab (TECENTRIQ) 1,200 mg in sodium chloride 0.9 % 250 mL chemo infusion  1,200 mg Intravenous Once Ledell Peoples IV, MD 540 mL/hr at 09/19/21 1220 1,200 mg at 09/19/21 1220   heparin lock flush 100 unit/mL  500 Units Intracatheter Once PRN Ledell Peoples IV, MD       sodium chloride flush (NS) 0.9 % injection 10 mL  10 mL Intracatheter PRN Orson Slick, MD  REVIEW OF SYSTEMS:   Constitutional: ( - ) fevers, ( - )  chills , ( - ) night sweats Eyes: ( - ) blurriness of vision, ( - ) double vision, ( - ) watery eyes Ears, nose, mouth, throat, and face: ( - )  mucositis, ( - ) sore throat Respiratory: ( - ) cough, ( +) dyspnea, ( - ) wheezes Cardiovascular: ( - ) palpitation, ( - ) chest discomfort, ( + ) lower extremity swelling Gastrointestinal:  ( -) nausea, ( - ) heartburn, ( - ) change in bowel habits Skin: ( - ) abnormal skin rashes Lymphatics: ( - ) new lymphadenopathy, ( - ) easy bruising Neurological: (+ ) numbness, ( - ) tingling, ( - ) new weaknesses Behavioral/Psych: ( - ) mood change, ( - ) new changes  All other systems were reviewed with the patient and are negative.  PHYSICAL EXAMINATION: ECOG PERFORMANCE STATUS: 1 - Symptomatic but completely ambulatory  Vitals:   09/19/21 1020  BP: (!) 140/78  Pulse: 88  Resp: 16  Temp: (!) 97.2 F (36.2 C)  SpO2: 95%   Filed Weights   09/19/21 1020  Weight: 198 lb 4.8 oz (89.9 kg)    GENERAL: Well-appearing middle-age Caucasian female, alert, no distress and comfortable SKIN: skin color, texture, turgor are normal, no rashes or significant lesions EYES: conjunctiva are pink and non-injected, sclera clear LUNGS:  normal breathing effort. Diffuse wheezing hear on ausculation.  HEART: regular rate & rhythm and no murmurs. Mild bilateral lower extremity edema Musculoskeletal: no cyanosis of digits and no clubbing  PSYCH: alert & oriented x 3, fluent speech NEURO: no focal motor/sensory deficits  LABORATORY DATA:  I have reviewed the data as listed    Latest Ref Rng & Units 09/19/2021    9:53 AM 08/29/2021   12:11 PM 08/08/2021   10:14 AM  CBC  WBC 4.0 - 10.5 K/uL 9.0  8.8  9.1   Hemoglobin 12.0 - 15.0 g/dL 13.2  12.4  12.6   Hematocrit 36.0 - 46.0 % 40.2  38.8  40.0   Platelets 150 - 400 K/uL 219  186  171        Latest Ref Rng & Units 09/19/2021    9:53 AM 08/29/2021   12:11 PM 08/08/2021   10:14 AM  CMP  Glucose 70 - 99 mg/dL 101  82  107   BUN 8 - 23 mg/dL 14  13  9    Creatinine 0.44 - 1.00 mg/dL 1.03  1.02  0.88   Sodium 135 - 145 mmol/L 136  133  139   Potassium 3.5 -  5.1 mmol/L 3.6  4.0  3.8   Chloride 98 - 111 mmol/L 101  98  105   CO2 22 - 32 mmol/L 29  30  29    Calcium 8.9 - 10.3 mg/dL 8.7  8.4  8.9   Total Protein 6.5 - 8.1 g/dL 6.5  6.5  6.5   Total Bilirubin 0.3 - 1.2 mg/dL 0.4  0.4  0.5   Alkaline Phos 38 - 126 U/L 87  92  78   AST 15 - 41 U/L 13  14  13    ALT 0 - 44 U/L 9  9  8      No results found for: "MPROTEIN" Lab Results  Component Value Date   KPAFRELGTCHN 0.75 06/30/2014   LAMBDASER 3.78 (H) 06/30/2014   KAPLAMBRATIO 0.20 (L) 06/30/2014     RADIOGRAPHIC STUDIES: No results found.  ASSESSMENT & PLAN [name] 66 y.o. female with medical history significant for extensive stage small cell lung cancer who presents for a follow up visit.   After review of the labs, review of the records, and discussion with the patient the patients findings are most consistent with extensive stage small cell lung cancer with metastasis from the right lower lobe to the lymph nodes of the abdomen.  At this time we will pursue triple therapy with carboplatin, etoposide, and atezolizumab.  After 4 cycles we will convert to maintenance atezolizumab alone.  We previously discussed the risks and benefits of this therapy and the patient was in agreement to proceed with this treatment.  The treatment of choice consist of carboplatin, etoposide, and atezolizumab.  The regimen consists of carboplatin AUC of 5 IV on day 1, etoposide 100 mg per metered squared IV on day 1, 2, and 3 and atezolizumab 1200 mg on day 1.  This continues for 21-day cycles.  After 4 cycles the patient proceeds with atezolizumab maintenance therapy alone.   #Small Cell Lung Cancer, Extensive Stage -- MRI of the brain shows no evidence of intracranial spread --Findings are currently consistent with metastatic small cell lung cancer with metastatic spread to the lymph nodes of the abdomen --Prognosis is poor with anticipated life span of less than 12 months -- Patient has extensive  stage due to metastatic spread to lymph nodes in the abdomen --Plan to proceed with carboplatin, etoposide, and atezolizumab --will consider prophylactic cranial radiation after start of chemotherapy.  Plan: --today is Cycle 15 Day 1 of Atezolizumab maintenance --labs today were reviewed and adequate for treatment. Hgb 13.2, white blood cell count 9.0, and platelets of 219. No intervention needed. --CT scan q 3 months. Last scan in June 2023 showed continued excellent response to therapy. CT scan next in Sept 2023.  --RTC in 3 weeks for next cycle of maintenance atezolizumab    #Supportive Care -- chemotherapy education complete -- port placed -- zofran 8mg  q8H PRN and compazine 10mg  PO q6H for nausea -- EMLA cream for port -- Will add mirtazapine in order to help with appetite/sleep  #Nausea: --Symptoms improved with zofran and olanzapine.  --Added IV zofran to infusion premeds for better symptom control.   #Hypokalemia:  --currently taking potassium chloride 10 mEq at bedtime --potassium level is 3.6 today. No further intervention is needed.  #Lower extremity edema: --Currently takes lasix 20 mg as needed.  --Improving. Continue to monitor.   #Neuropathy involving fingers/feet: --Patient currently takes Cymbalta   No orders of the defined types were placed in this encounter.  All questions were answered. The patient knows to call the clinic with any problems, questions or concerns.  I have spent a total of 30 minutes minutes of face-to-face and non-face-to-face time, preparing to see the patient, performing a medically appropriate examination, counseling and educating the patient, ordering medications/tests, documenting clinical information in the electronic health record, and care coordination.   Dede Query PA-C Dept of Hematology and [place] [facility] Phone: [phone]   09/19/2021 12:25 PM

## 2021-09-21 DIAGNOSIS — H905 Unspecified sensorineural hearing loss: Secondary | ICD-10-CM | POA: Diagnosis not present

## 2021-09-26 ENCOUNTER — Other Ambulatory Visit: Payer: Self-pay | Admitting: Physical Medicine and Rehabilitation

## 2021-09-26 DIAGNOSIS — G894 Chronic pain syndrome: Secondary | ICD-10-CM

## 2021-10-01 ENCOUNTER — Other Ambulatory Visit: Payer: Self-pay | Admitting: Hematology and Oncology

## 2021-10-01 DIAGNOSIS — C3490 Malignant neoplasm of unspecified part of unspecified bronchus or lung: Secondary | ICD-10-CM

## 2021-10-01 NOTE — Progress Notes (Signed)
ON PATHWAY REGIMEN - Small Cell Lung  No Change  Continue With Treatment as Ordered.  Original Decision Date/Time: 10/27/2020 12:44     Cycles 1 through 4, every 21 days:     Atezolizumab      Carboplatin      Etoposide    Cycles 5 and beyond, every 21 days:     Atezolizumab   **Always confirm dose/schedule in your pharmacy ordering system**  Patient Characteristics: Newly Diagnosed, Preoperative or Nonsurgical Candidate (Clinical Staging), First Line, Extensive Stage Therapeutic Status: Newly Diagnosed, Preoperative or Nonsurgical Candidate (Clinical Staging) AJCC T Category: cT1c AJCC N Category: cN0 AJCC M Category: cM1b AJCC 8 Stage Grouping: IVA Stage Classification: Extensive  Intent of Therapy: Non-Curative / Palliative Intent, Discussed with Patient

## 2021-10-02 DIAGNOSIS — H353221 Exudative age-related macular degeneration, left eye, with active choroidal neovascularization: Secondary | ICD-10-CM | POA: Diagnosis not present

## 2021-10-02 DIAGNOSIS — H04123 Dry eye syndrome of bilateral lacrimal glands: Secondary | ICD-10-CM | POA: Diagnosis not present

## 2021-10-02 DIAGNOSIS — H25093 Other age-related incipient cataract, bilateral: Secondary | ICD-10-CM | POA: Diagnosis not present

## 2021-10-02 DIAGNOSIS — H353112 Nonexudative age-related macular degeneration, right eye, intermediate dry stage: Secondary | ICD-10-CM | POA: Diagnosis not present

## 2021-10-03 DIAGNOSIS — M1712 Unilateral primary osteoarthritis, left knee: Secondary | ICD-10-CM | POA: Diagnosis not present

## 2021-10-03 DIAGNOSIS — M1711 Unilateral primary osteoarthritis, right knee: Secondary | ICD-10-CM | POA: Diagnosis not present

## 2021-10-10 ENCOUNTER — Encounter: Payer: Self-pay | Admitting: Hematology and Oncology

## 2021-10-10 ENCOUNTER — Other Ambulatory Visit: Payer: Self-pay

## 2021-10-10 ENCOUNTER — Inpatient Hospital Stay: Payer: Medicare Other | Admitting: Dietician

## 2021-10-10 ENCOUNTER — Encounter: Payer: Self-pay | Admitting: Physical Medicine and Rehabilitation

## 2021-10-10 ENCOUNTER — Inpatient Hospital Stay: Payer: Medicare Other

## 2021-10-10 ENCOUNTER — Inpatient Hospital Stay (HOSPITAL_BASED_OUTPATIENT_CLINIC_OR_DEPARTMENT_OTHER): Payer: Medicare Other | Admitting: Hematology and Oncology

## 2021-10-10 ENCOUNTER — Inpatient Hospital Stay: Payer: Medicare Other | Attending: Hematology and Oncology

## 2021-10-10 VITALS — BP 144/71 | HR 93 | Temp 98.0°F | Resp 18 | Ht 64.0 in | Wt 200.3 lb

## 2021-10-10 DIAGNOSIS — R600 Localized edema: Secondary | ICD-10-CM | POA: Insufficient documentation

## 2021-10-10 DIAGNOSIS — Z79899 Other long term (current) drug therapy: Secondary | ICD-10-CM | POA: Diagnosis not present

## 2021-10-10 DIAGNOSIS — Z8261 Family history of arthritis: Secondary | ICD-10-CM | POA: Diagnosis not present

## 2021-10-10 DIAGNOSIS — Z885 Allergy status to narcotic agent status: Secondary | ICD-10-CM | POA: Diagnosis not present

## 2021-10-10 DIAGNOSIS — Z886 Allergy status to analgesic agent status: Secondary | ICD-10-CM | POA: Insufficient documentation

## 2021-10-10 DIAGNOSIS — D72829 Elevated white blood cell count, unspecified: Secondary | ICD-10-CM | POA: Diagnosis not present

## 2021-10-10 DIAGNOSIS — Z880 Allergy status to penicillin: Secondary | ICD-10-CM | POA: Insufficient documentation

## 2021-10-10 DIAGNOSIS — Z888 Allergy status to other drugs, medicaments and biological substances status: Secondary | ICD-10-CM | POA: Diagnosis not present

## 2021-10-10 DIAGNOSIS — Z79890 Hormone replacement therapy (postmenopausal): Secondary | ICD-10-CM | POA: Insufficient documentation

## 2021-10-10 DIAGNOSIS — Z836 Family history of other diseases of the respiratory system: Secondary | ICD-10-CM | POA: Diagnosis not present

## 2021-10-10 DIAGNOSIS — E119 Type 2 diabetes mellitus without complications: Secondary | ICD-10-CM | POA: Insufficient documentation

## 2021-10-10 DIAGNOSIS — C3490 Malignant neoplasm of unspecified part of unspecified bronchus or lung: Secondary | ICD-10-CM

## 2021-10-10 DIAGNOSIS — E876 Hypokalemia: Secondary | ICD-10-CM | POA: Diagnosis not present

## 2021-10-10 DIAGNOSIS — G629 Polyneuropathy, unspecified: Secondary | ICD-10-CM | POA: Diagnosis not present

## 2021-10-10 DIAGNOSIS — F17210 Nicotine dependence, cigarettes, uncomplicated: Secondary | ICD-10-CM | POA: Diagnosis not present

## 2021-10-10 DIAGNOSIS — Z823 Family history of stroke: Secondary | ICD-10-CM | POA: Insufficient documentation

## 2021-10-10 DIAGNOSIS — E785 Hyperlipidemia, unspecified: Secondary | ICD-10-CM | POA: Diagnosis not present

## 2021-10-10 DIAGNOSIS — Z90721 Acquired absence of ovaries, unilateral: Secondary | ICD-10-CM | POA: Insufficient documentation

## 2021-10-10 DIAGNOSIS — Z8719 Personal history of other diseases of the digestive system: Secondary | ICD-10-CM | POA: Diagnosis not present

## 2021-10-10 DIAGNOSIS — Z882 Allergy status to sulfonamides status: Secondary | ICD-10-CM | POA: Diagnosis not present

## 2021-10-10 DIAGNOSIS — Z5986 Financial insecurity: Secondary | ICD-10-CM | POA: Insufficient documentation

## 2021-10-10 DIAGNOSIS — Z820 Family history of epilepsy and other diseases of the nervous system: Secondary | ICD-10-CM | POA: Insufficient documentation

## 2021-10-10 DIAGNOSIS — J449 Chronic obstructive pulmonary disease, unspecified: Secondary | ICD-10-CM | POA: Diagnosis not present

## 2021-10-10 DIAGNOSIS — Z825 Family history of asthma and other chronic lower respiratory diseases: Secondary | ICD-10-CM | POA: Insufficient documentation

## 2021-10-10 DIAGNOSIS — Z9049 Acquired absence of other specified parts of digestive tract: Secondary | ICD-10-CM | POA: Insufficient documentation

## 2021-10-10 DIAGNOSIS — Z95828 Presence of other vascular implants and grafts: Secondary | ICD-10-CM

## 2021-10-10 DIAGNOSIS — C3431 Malignant neoplasm of lower lobe, right bronchus or lung: Secondary | ICD-10-CM | POA: Diagnosis not present

## 2021-10-10 DIAGNOSIS — Z814 Family history of other substance abuse and dependence: Secondary | ICD-10-CM | POA: Insufficient documentation

## 2021-10-10 DIAGNOSIS — Z8249 Family history of ischemic heart disease and other diseases of the circulatory system: Secondary | ICD-10-CM | POA: Insufficient documentation

## 2021-10-10 DIAGNOSIS — Z809 Family history of malignant neoplasm, unspecified: Secondary | ICD-10-CM | POA: Insufficient documentation

## 2021-10-10 DIAGNOSIS — Z811 Family history of alcohol abuse and dependence: Secondary | ICD-10-CM | POA: Insufficient documentation

## 2021-10-10 DIAGNOSIS — Z5112 Encounter for antineoplastic immunotherapy: Secondary | ICD-10-CM | POA: Diagnosis present

## 2021-10-10 LAB — CMP (CANCER CENTER ONLY)
ALT: 15 U/L (ref 0–44)
AST: 15 U/L (ref 15–41)
Albumin: 3.6 g/dL (ref 3.5–5.0)
Alkaline Phosphatase: 88 U/L (ref 38–126)
Anion gap: 5 (ref 5–15)
BUN: 10 mg/dL (ref 8–23)
CO2: 29 mmol/L (ref 22–32)
Calcium: 8.4 mg/dL — ABNORMAL LOW (ref 8.9–10.3)
Chloride: 101 mmol/L (ref 98–111)
Creatinine: 1.15 mg/dL — ABNORMAL HIGH (ref 0.44–1.00)
GFR, Estimated: 53 mL/min — ABNORMAL LOW (ref >60)
Glucose, Bld: 103 mg/dL — ABNORMAL HIGH (ref 70–99)
Potassium: 3.6 mmol/L (ref 3.5–5.1)
Sodium: 135 mmol/L (ref 135–145)
Total Bilirubin: 0.5 mg/dL (ref 0.3–1.2)
Total Protein: 6.2 g/dL — ABNORMAL LOW (ref 6.5–8.1)

## 2021-10-10 LAB — CBC WITH DIFFERENTIAL (CANCER CENTER ONLY)
Abs Immature Granulocytes: 0.04 10*3/uL (ref 0.00–0.07)
Basophils Absolute: 0 10*3/uL (ref 0.0–0.1)
Basophils Relative: 0 %
Eosinophils Absolute: 0.2 10*3/uL (ref 0.0–0.5)
Eosinophils Relative: 2 %
HCT: 38.4 % (ref 36.0–46.0)
Hemoglobin: 12.2 g/dL (ref 12.0–15.0)
Immature Granulocytes: 0 %
Lymphocytes Relative: 18 %
Lymphs Abs: 2 10*3/uL (ref 0.7–4.0)
MCH: 27.3 pg (ref 26.0–34.0)
MCHC: 31.8 g/dL (ref 30.0–36.0)
MCV: 85.9 fL (ref 80.0–100.0)
Monocytes Absolute: 1.2 10*3/uL — ABNORMAL HIGH (ref 0.1–1.0)
Monocytes Relative: 11 %
Neutro Abs: 7.8 10*3/uL — ABNORMAL HIGH (ref 1.7–7.7)
Neutrophils Relative %: 69 %
Platelet Count: 153 10*3/uL (ref 150–400)
RBC: 4.47 MIL/uL (ref 3.87–5.11)
RDW: 18.1 % — ABNORMAL HIGH (ref 11.5–15.5)
WBC Count: 11.4 10*3/uL — ABNORMAL HIGH (ref 4.0–10.5)
nRBC: 0 % (ref 0.0–0.2)

## 2021-10-10 LAB — TSH: TSH: 0.709 u[IU]/mL (ref 0.350–4.500)

## 2021-10-10 MED ORDER — SODIUM CHLORIDE 0.9 % IV SOLN: 8.0000 mg | Freq: Once | INTRAVENOUS | Status: DC

## 2021-10-10 MED ORDER — SODIUM CHLORIDE 0.9 % IV SOLN: Freq: Once | INTRAVENOUS | Status: AC

## 2021-10-10 MED ORDER — ONDANSETRON HCL 4 MG/2ML IJ SOLN
8.0000 mg | Freq: Once | INTRAMUSCULAR | Status: AC
  Administered 2021-10-10: 8 mg via INTRAVENOUS
  Filled 2021-10-10: qty 4

## 2021-10-10 MED ORDER — SODIUM CHLORIDE 0.9% FLUSH
10.0000 mL | INTRAVENOUS | Status: DC | PRN
  Administered 2021-10-10: 10 mL

## 2021-10-10 MED ORDER — SODIUM CHLORIDE 0.9% FLUSH
10.0000 mL | Freq: Once | INTRAVENOUS | Status: AC
  Administered 2021-10-10: 10 mL

## 2021-10-10 MED ORDER — HEPARIN SOD (PORK) LOCK FLUSH 100 UNIT/ML IV SOLN
500.0000 [IU] | Freq: Once | INTRAVENOUS | Status: AC | PRN
  Administered 2021-10-10: 500 [IU]

## 2021-10-10 MED ORDER — SODIUM CHLORIDE 0.9 % IV SOLN
1200.0000 mg | Freq: Once | INTRAVENOUS | Status: AC
  Administered 2021-10-10: 1200 mg via INTRAVENOUS
  Filled 2021-10-10: qty 20

## 2021-10-10 NOTE — Progress Notes (Signed)
[place] [phone]   [phone]  PROGRESS NOTE  Patient Care Team: Allwardt, Randa Evens, PA-C as PCP - General (Physician Assistant) Milus Banister, MD as Attending Physician (Gastroenterology) Dr. Darylene Price, MD as Consulting Physician (Orthopedic Surgery) Latanya Maudlin, MD as Consulting Physician (Orthopedic Surgery) Kathrynn Ducking, MD (Inactive) as Consulting Physician (Neurology) Okey Regal, [place] as Consulting Physician (Optometry) Delice Lesch Lezlie Octave, MD as Consulting Physician (Neurology) Edythe Clarity, [facility] (Pharmacist)  Hematological/Oncological History # Small Cell Lung Cancer, Extensive Stage 07/05/2020: CT abdomen for lower abdominal pain. New pulmonary nodular density noted 07/06/2020: CT chest showed 2.2 cm macrolobulated right lower lobe pulmonary nodule (favored) versus pathologically enlarged infrahilar lymph node 10/13/2020: PET CT scan performed, findings show 2 cm right lower lobe lung mass is hypermetabolic and consistent with primary lung neoplasm. Additionally found hypermetabolic 17 mm soft tissue lesion between the descending duodenum and the pancreatic head  10/19/2020: EGD to biopsy hypermetabolic lymph node. Biopsy results show small cell lung cancer 10/26/2020: establish care with Dr. Lorenso Courier  11/13/2020: Cycle 1 Day 1 of Carbo/Etop/Atezolizumab 12/04/2020: Cycle 2 Day 1 of Carbo/Etop/Atezolizumab 11/22-11/25/2022: admitted for E. Coli bacteremia/sepsis. Start of Cycle 3 delayed. 01/02/2021: Cycle 3 Day 1 of Carbo/Etop/Atezolizumab 01/23/2021: Cycle 4 Day 1 of Carbo/Etop/Atezolizumab 02/22/2021: Cycle 5 Day 1 of Atezolizumab Maintenance. Delayed start due to patient's COVID infection.  03/14/2021: Cycle 6 Day 1 of Atezolizumab Maintenance 04/05/2021: Cycle 7 Day 1 of Atezolizumab Maintenance 04/25/2021: Cycle 8 Day 1 of Atezolizumab Maintenance 05/16/2021: Cycle 9 Day 1 of Atezolizumab Maintenance 06/06/2021:  Cycle 10 Day 1 of Atezolizumab Maintenance 06/27/2021:  Cycle 11 Day 1 of Atezolizumab Maintenance 07/18/2021: Cycle 12 Day 1 of Atezolizumab Maintenance 08/08/2021: Cycle 13 Day 1 of Atezolizumab Maintenance 08/29/2021: Cycle 14 Day 1 of Atezolizumab Maintenance 09/19/2021: Cycle 15 Day 1 of Atezolizumab Maintenance 10/10/2021: Cycle 16 Day 1 of Atezolizumab Maintenance  Interval History:  [name] 66 y.o. female with medical history significant for extensive stage small cell lung cancer who presents for a follow up visit. The patient's last visit was on 09/19/2021. In the interim since the last visit she has completed cycle 15 of chemotherapy. She presents today to start cycle 16 of chemotherapy.  On exam today [name] reports she is feeling sleepy.  She was taking a nap in the room when I arrived today.  She notes her energy has been quite low today.  She reports that her last cycle went quite well.  She has some fatigue for the first 1 or 2 days but was much better the subsequent 3 weeks.  She reports that those 1 to 2 days after her treatment were "awful" she notes that she has some discomfort and that she "hurt all over".  She notes that she was having difficulty getting sleep.  She reports that she is currently remodeling her house and they are working on her laundry room and bathroom.  She reports that she has been working quite hard during this remodel.  She notes that she is eating quite well and sports.  Sometimes she does not have any appetite.  She does enjoy her gravy, green veggies, cream potatoes, and collard greens.  She does not enjoy meat as much.  She is not having any trouble with nausea, vomiting, or diarrhea at this time.  She is anxious about upcoming possible cataract surgery.  She denies fevers, chills, night sweats, chest pain or cough.She has no other complaints.  A full 10 point ROS is listed below.   MEDICAL HISTORY:  Past Medical History:  Diagnosis Date   Allergic  rhinitis    Anemia    Anxiety    Chicken pox    Chronic back pain    COPD (chronic obstructive pulmonary disease) (HCC)    Depression    DM (diabetes mellitus) ([place])    Essential hypertension    GERD (gastroesophageal reflux disease)    Headache    migraines   History of gastritis    EGD 2015   History of home oxygen therapy    2 liters at hs last 6 months   Hyperlipidemia    Hypothyroidism    Migraines    Osteoarthritis    oa   Scoliosis     SURGICAL HISTORY: Past Surgical History:  Procedure Laterality Date   APPENDECTOMY     1985   BIOPSY  07/24/2017   Procedure: BIOPSY;  Surgeon: Milus Banister, MD;  Location: [facility];  Service: Endoscopy;;   CARDIAC CATHETERIZATION N/A 10/31/2015   Procedure: Left Heart Cath and Coronary Angiography;  Surgeon: Leonie Man, MD;  Location: [facility];  Service: Cardiovascular;  Laterality: N/A;   CARPAL TUNNEL RELEASE Left    CARPAL TUNNEL RELEASE Right    CHOLECYSTECTOMY  late 1980's   COLONOSCOPY WITH PROPOFOL N/A 07/24/2017   Procedure: COLONOSCOPY WITH PROPOFOL;  Surgeon: Milus Banister, MD;  [facility];  Service: Endoscopy;  Laterality: N/A;   ESOPHAGOGASTRODUODENOSCOPY N/A 07/24/2017   Procedure: ESOPHAGOGASTRODUODENOSCOPY (EGD);  Surgeon: Milus Banister, MD;  Location: [facility];  Service: Endoscopy;  Laterality: N/A;   ESOPHAGOGASTRODUODENOSCOPY (EGD) WITH PROPOFOL N/A 10/19/2020   Procedure: ESOPHAGOGASTRODUODENOSCOPY (EGD) WITH PROPOFOL;  Surgeon: Milus Banister, MD;  [facility];  Service: Endoscopy;  Laterality: N/A;   FINE NEEDLE ASPIRATION N/A 10/19/2020   Procedure: FINE NEEDLE ASPIRATION (FNA) LINEAR;  Surgeon: Milus Banister, MD;  [facility];  Service: Endoscopy;  Laterality: N/A;   GALLBLADDER SURGERY  1991   HIP CLOSED REDUCTION Right 01/08/2016   Procedure: CLOSED MANIPULATION HIP;  Surgeon: Susa Day, MD;  [facility];  Service: Orthopedics;   Laterality: Right;   HIP CLOSED REDUCTION Right 01/19/2016   Procedure: ATTEMPTED CLOSED REDUCTION RIGHT HIP;  Surgeon: Wylene Simmer, MD;  [facility];  Service: Orthopedics;  Laterality: Right;   HIP CLOSED REDUCTION Right 01/20/2016   Procedure: CLOSED REDUCTION RIGHT TOTAL HIP;  Surgeon: Paralee Cancel, MD;  [facility];  Service: Orthopedics;  Laterality: Right;   HIP CLOSED REDUCTION Right 02/17/2016   Procedure: CLOSED REDUCTION RIGHT TOTAL HIP;  Surgeon: Rod Can, MD;  Location: [facility];  Service: Orthopedics;  Laterality: Right;   HIP CLOSED REDUCTION Right 02/28/2016   Procedure: CLOSED REDUCTION HIP;  Surgeon: Nicholes Stairs, MD;  [facility];  Service: Orthopedics;  Laterality: Right;   IR IMAGING GUIDED PORT INSERTION  11/01/2020   POLYPECTOMY  07/24/2017   Procedure: POLYPECTOMY;  Surgeon: Milus Banister, MD;  [facility];  Service: Endoscopy;;   [place], with 1 ovary removed and 2 nd ovary removed 2003   TOTAL HIP ARTHROPLASTY Right    Original surgery 2006 with revision 2010   TOTAL HIP REVISION Right 01/01/2016   Procedure: TOTAL HIP REVISION;  Surgeon: Paralee Cancel, MD;  [facility];  Service: Orthopedics;  Laterality: Right;  TOTAL HIP REVISION Right 03/02/2016   Procedure: TOTAL HIP REVISION;  Surgeon: Paralee Cancel, MD;  [facility];  Service: Orthopedics;  Laterality: Right;   TOTAL HIP REVISION Right 09/02/2016   Procedure: Right hip constrained liner- posterior;  Surgeon: Paralee Cancel, MD;  [facility];  Service: Orthopedics;  Laterality: Right;   ULNAR NERVE TRANSPOSITION Right    UPPER ESOPHAGEAL ENDOSCOPIC ULTRASOUND (EUS) N/A 10/19/2020   Procedure: UPPER ESOPHAGEAL ENDOSCOPIC ULTRASOUND (EUS);  Surgeon: Milus Banister, MD;  Location: [facility];  Service: Endoscopy;  Laterality: N/A;  periduodenal lesion    SOCIAL HISTORY: Social History   Socioeconomic History    Marital status: Married    Spouse name: Not on file   Number of children: 2   Years of education: Not on file   Highest education level: Not on file  Occupational History   Occupation: disabled   Occupation: disabled  Tobacco Use   Smoking status: Every Day    Packs/day: 2.00    Years: 46.00    Total pack years: 92.00    Types: Cigarettes   Smokeless tobacco: Never   Tobacco comments:    2 packs of cigarettes smoked daily ARJ 04/10/21  Vaping Use   Vaping Use: Never used  Substance and Sexual Activity   Alcohol use: No   Drug use: No   Sexual activity: Not Currently    Partners: Male  Other Topics Concern   Not on file  Social History Narrative   Right handed    Caffeine~ 2 cups per day    Lives at home with husband (strained relationship)   Primary caretaker for disabled brother who had aneurism   Daughter died 22-Jun-2018    Social Determinants of Health   Financial Resource Strain: Medium Risk (05/03/2021)   Overall Financial Resource Strain (CARDIA)    Difficulty of Paying Living Expenses: Somewhat hard  Food Insecurity: No Food Insecurity (05/03/2021)   Hunger Vital Sign    Worried About Running Out of Food in the Last Year: Never true    Ran Out of Food in the Last Year: Never true  Transportation Needs: No Transportation Needs (12/27/2019)   PRAPARE - Hydrologist (Medical): No    Lack of Transportation (Non-Medical): No  Physical Activity: Inactive (05/03/2021)   Exercise Vital Sign    Days of Exercise per Week: 0 days    Minutes of Exercise per Session: 0 min  Stress: Stress Concern Present (05/03/2021)   [place]    Feeling of Stress : To some extent  Social Connections: Moderately Integrated (05/03/2021)   Social Connection and Isolation Panel [NHANES]    Frequency of Communication with Friends and Family: More than three times a week    Frequency of Social Gatherings  with Friends and Family: Once a week    Attends Religious Services: 1 to 4 times per year    Active Member of [employer] or Organizations: No    Attends Archivist Meetings: Never    Marital Status: Married  Human resources officer Violence: Not At Risk (05/03/2021)   Humiliation, Afraid, Rape, and Kick questionnaire    Fear of Current or Ex-Partner: No    Emotionally Abused: No    Physically Abused: No    Sexually Abused: No    FAMILY HISTORY: Family History  Problem Relation Age of Onset   COPD Mother    Heart disease Mother  Lung disease Father        Asbestosis   Heart attack Father    Heart disease Father    Cerebral aneurysm Brother    Aneurysm Brother        Brain   Drug abuse Daughter    Epilepsy Son    Alcohol abuse Son    Drug abuse Son    Arthritis Maternal Grandmother    Heart disease Maternal Grandmother    Asthma Maternal Grandfather    Cancer Maternal Grandfather    Arthritis Paternal Grandmother    Heart disease Paternal Grandmother    Stroke Paternal Grandmother    Early death Paternal Grandfather    Heart disease Paternal Grandfather     ALLERGIES:  is allergic to metformin and related, nsaids, wellbutrin [bupropion], aleve [naproxen sodium], codeine, penicillins, and sulfonamide derivatives.  MEDICATIONS:  Current Outpatient Medications  Medication Sig Dispense Refill   albuterol (PROAIR HFA) 108 (90 Base) MCG/ACT inhaler 2 puffs every 4 hours as needed only  if your can't catch your breath 18 g 3   ALPRAZolam (XANAX) 1 MG tablet TAKE 1 TABLET BY MOUTH THREE TIMES DAILY AS NEEDED FOR ANXIETY 90 tablet 2   atorvastatin (LIPITOR) 20 MG tablet TAKE 1 TABLET BY MOUTH AT BEDTIME 90 tablet 1   benzonatate (TESSALON) 100 MG capsule TAKE 1 CAPSULE BY MOUTH THREE TIMES DAILY 30 capsule 1   Budeson-Glycopyrrol-Formoterol (BREZTRI AEROSPHERE) 160-9-4.8 MCG/ACT AERO Inhale 2 puffs into the lungs in the morning and at bedtime. 10.7 g 3   cyclobenzaprine  (FLEXERIL) 10 MG tablet Take 1 tablet (10 mg total) by mouth 3 (three) times daily as needed for muscle spasms. 90 tablet 5   DULoxetine (CYMBALTA) 60 MG capsule TAKE TWO CAPSULES BY MOUTH DAILY 60 capsule 5   fluticasone (FLONASE) 50 MCG/ACT nasal spray INSTILL 2 SPRAYS IN EACH NOSTRIL EVERY DAY 16 g 6   fluticasone-salmeterol (ADVAIR DISKUS) 250-50 MCG/ACT AEPB Inhale 1 puff into the lungs in the morning and at bedtime. 60 each 5   furosemide (LASIX) 20 MG tablet Take 20 mg BID for 7 days then continue to take 1- 20mg  tablet daily 37 tablet 0   HYDROcodone-acetaminophen (NORCO) 10-325 MG tablet TAKE 1 TABLET BY MOUTH EVERY 4 HOURS AS NEEDED max FIVE tablets DAILY 150 tablet 0   levothyroxine (SYNTHROID) 137 MCG tablet Take 1 tablet (137 mcg total) by mouth daily before breakfast. 30 tablet 5   loratadine (CLARITIN) 10 MG tablet Take 10 mg by mouth daily.     meloxicam (MOBIC) 15 MG tablet Take 1 tablet (15 mg total) by mouth daily. 30 tablet 2   methadone (DOLOPHINE) 5 MG tablet Take 1 tablet (5 mg total) by mouth every 12 (twelve) hours. (Patient not taking: Reported on 09/19/2021) 60 tablet 0   OLANZapine (ZYPREXA) 10 MG tablet Take 1 tablet (10 mg total) by mouth at bedtime. 30 tablet 2   omeprazole (PRILOSEC) 40 MG capsule TAKE ONE CAPSULE BY MOUTH TWICE DAILY 60 capsule 2   ondansetron (ZOFRAN) 8 MG tablet TAKE 2 TABLETS BY MOUTH TWICE DAILY AS NEEDED 60 tablet 0   OXYGEN Inhale 3 L into the lungs at bedtime. Uses At Night     potassium chloride (KLOR-CON M) 10 MEQ tablet TAKE 1 TABLET BY MOUTH AT BEDTIME 90 tablet 1   prochlorperazine (COMPAZINE) 10 MG tablet Take 1 tablet (10 mg total) by mouth every 6 (six) hours as needed for nausea or vomiting. 30 tablet 0  triamterene-hydrochlorothiazide (MAXZIDE-25) 37.5-25 MG tablet TAKE 1 TABLET BY MOUTH DAILY - EMERGENCY REFILL FAXED DR. 90 tablet 2   No current facility-administered medications for this visit.    REVIEW OF SYSTEMS:    Constitutional: ( - ) fevers, ( - )  chills , ( - ) night sweats Eyes: ( - ) blurriness of vision, ( - ) double vision, ( - ) watery eyes Ears, nose, mouth, throat, and face: ( - ) mucositis, ( - ) sore throat Respiratory: ( - ) cough, ( +) dyspnea, ( - ) wheezes Cardiovascular: ( - ) palpitation, ( - ) chest discomfort, ( + ) lower extremity swelling Gastrointestinal:  ( -) nausea, ( - ) heartburn, ( - ) change in bowel habits Skin: ( - ) abnormal skin rashes Lymphatics: ( - ) new lymphadenopathy, ( - ) easy bruising Neurological: (+ ) numbness, ( - ) tingling, ( - ) new weaknesses Behavioral/Psych: ( - ) mood change, ( - ) new changes  All other systems were reviewed with the patient and are negative.  PHYSICAL EXAMINATION: ECOG PERFORMANCE STATUS: 1 - Symptomatic but completely ambulatory  Vitals:   10/10/21 1113  BP: (!) 144/71  Pulse: 93  Resp: 18  Temp: 98 F (36.7 C)  SpO2: 95%   Filed Weights   10/10/21 1113  Weight: 200 lb 4.8 oz (90.9 kg)    GENERAL: Well-appearing middle-age Caucasian female, alert, no distress and comfortable SKIN: skin color, texture, turgor are normal, no rashes or significant lesions EYES: conjunctiva are pink and non-injected, sclera clear LUNGS:  normal breathing effort. Diffuse wheezing hear on ausculation.  HEART: regular rate & rhythm and no murmurs. Mild bilateral lower extremity edema Musculoskeletal: no cyanosis of digits and no clubbing  PSYCH: alert & oriented x 3, fluent speech NEURO: no focal motor/sensory deficits  LABORATORY DATA:  I have reviewed the data as listed    Latest Ref Rng & Units 10/10/2021   10:58 AM 09/19/2021    9:53 AM 08/29/2021   12:11 PM  CBC  WBC 4.0 - 10.5 K/uL 11.4  9.0  8.8   Hemoglobin 12.0 - 15.0 g/dL 12.2  13.2  12.4   Hematocrit 36.0 - 46.0 % 38.4  40.2  38.8   Platelets 150 - 400 K/uL 153  219  186        Latest Ref Rng & Units 10/10/2021   10:58 AM 09/19/2021    9:53 AM 08/29/2021   12:11 PM   CMP  Glucose 70 - 99 mg/dL 103  101  82   BUN 8 - 23 mg/dL 10  14  13    Creatinine 0.44 - 1.00 mg/dL 1.15  1.03  1.02   Sodium 135 - 145 mmol/L 135  136  133   Potassium 3.5 - 5.1 mmol/L 3.6  3.6  4.0   Chloride 98 - 111 mmol/L 101  101  98   CO2 22 - 32 mmol/L 29  29  30    Calcium 8.9 - 10.3 mg/dL 8.4  8.7  8.4   Total Protein 6.5 - 8.1 g/dL 6.2  6.5  6.5   Total Bilirubin 0.3 - 1.2 mg/dL 0.5  0.4  0.4   Alkaline Phos 38 - 126 U/L 88  87  92   AST 15 - 41 U/L 15  13  14    ALT 0 - 44 U/L 15  9  9      No results found for: "MPROTEIN" Lab Results  Component Value Date   KPAFRELGTCHN 0.75 06/30/2014   LAMBDASER 3.78 (H) 06/30/2014   KAPLAMBRATIO 0.20 (L) 06/30/2014     RADIOGRAPHIC STUDIES: No results found.  ASSESSMENT & PLAN [name] 66 y.o. female with medical history significant for extensive stage small cell lung cancer who presents for a follow up visit.   After review of the labs, review of the records, and discussion with the patient the patients findings are most consistent with extensive stage small cell lung cancer with metastasis from the right lower lobe to the lymph nodes of the abdomen.  At this time we will pursue triple therapy with carboplatin, etoposide, and atezolizumab.  After 4 cycles we will convert to maintenance atezolizumab alone.  We previously discussed the risks and benefits of this therapy and the patient was in agreement to proceed with this treatment.  The treatment of choice consist of carboplatin, etoposide, and atezolizumab.  The regimen consists of carboplatin AUC of 5 IV on day 1, etoposide 100 mg per metered squared IV on day 1, 2, and 3 and atezolizumab 1200 mg on day 1.  This continues for 21-day cycles.  After 4 cycles the patient proceeds with atezolizumab maintenance therapy alone.   #Small Cell Lung Cancer, Extensive Stage -- MRI of the brain shows no evidence of intracranial spread --Findings are currently consistent with  metastatic small cell lung cancer with metastatic spread to the lymph nodes of the abdomen --Prognosis is poor with anticipated life span of less than 12 months -- Patient has extensive stage due to metastatic spread to lymph nodes in the abdomen --Plan to proceed with carboplatin, etoposide, and atezolizumab --will consider prophylactic cranial radiation after start of chemotherapy.  Plan: --today is Cycle 16 Day 1 of Atezolizumab maintenance --labs today were reviewed and adequate for treatment. Hgb 12.2, white blood cell count 11.4, MCV 85.9, and platelets of 153 --CT scan q 3 months. Last scan in June 2023 showed continued excellent response to therapy. CT scan next in Sept 2023.  --RTC in 3 weeks for next cycle of maintenance atezolizumab   #Leukocytosis --steriod knee injections last week may explain leukocytosis, no infectious symptoms today.   #Supportive Care -- chemotherapy education complete -- port placed -- zofran 8mg  q8H PRN and compazine 10mg  PO q6H for nausea -- EMLA cream for port -- Will add mirtazapine in order to help with appetite/sleep  #Nausea: --Symptoms improved with zofran and olanzapine.  --Added IV zofran to infusion premeds for better symptom control.   #Hypokalemia:  --currently taking potassium chloride 10 mEq at bedtime --potassium level is 3.6 today. No further intervention is needed.  #Lower extremity edema: --Currently takes lasix 20 mg as needed.  --Improving. Continue to monitor.   #Neuropathy involving fingers/feet: --Patient currently takes Cymbalta   Orders Placed This Encounter  Procedures   CT CHEST ABDOMEN PELVIS W CONTRAST    Standing Status:   Future    Standing Expiration Date:   10/12/2022    Order Specific Question:   Preferred imaging location?    Answer:   [facility]    Order Specific Question:   Is Oral Contrast requested for this exam?    Answer:   Yes, Per Radiology protocol   TSH    Standing Status:   Future     Standing Expiration Date:   11/21/2022   T4    Standing Status:   Future    Standing Expiration Date:   11/21/2022   CBC with  Differential (Cancer Center Only)    Standing Status:   Future    Standing Expiration Date:   11/22/2022   CMP ([facility] only)    Standing Status:   Future    Standing Expiration Date:   11/22/2022   TSH    Standing Status:   Future    Standing Expiration Date:   12/12/2022   T4    Standing Status:   Future    Standing Expiration Date:   12/12/2022   CBC with Differential ([facility] Only)    Standing Status:   Future    Standing Expiration Date:   12/13/2022   CMP ([facility] only)    Standing Status:   Future    Standing Expiration Date:   12/13/2022   All questions were answered. The patient knows to call the clinic with any problems, questions or concerns.  I have spent a total of 30 minutes minutes of face-to-face and non-face-to-face time, preparing to see the patient, performing a medically appropriate examination, counseling and educating the patient, ordering medications/tests, documenting clinical information in the electronic health record, and care coordination.   Ledell Peoples, MD Department of Hematology/Oncology [facility] Phone: [phone] Pager: [phone] Email: [email or web address]  10/11/2021 9:51 AM

## 2021-10-10 NOTE — Patient Instructions (Signed)
[facility]  Discharge Instructions: Thank you for choosing [facility] to provide your oncology and hematology care.   If you have a lab appointment with the [facility], please go directly to the [facility] and check in at the registration area.   Wear comfortable clothing and clothing appropriate for easy access to any Portacath or PICC line.   We strive to give you quality time with your provider. You may need to reschedule your appointment if you arrive late (15 or more minutes).  Arriving late affects you and other patients whose appointments are after yours.  Also, if you miss three or more appointments without notifying the office, you may be dismissed from the clinic at the provider's discretion.      For prescription refill requests, have your pharmacy contact our office and allow 72 hours for refills to be completed.    Today you received the following chemotherapy and/or immunotherapy agents: atezolizumab      To help prevent nausea and vomiting after your treatment, we encourage you to take your nausea medication as directed.  BELOW ARE SYMPTOMS THAT SHOULD BE REPORTED IMMEDIATELY: *FEVER GREATER THAN 100.4 F (38 C) OR HIGHER *CHILLS OR SWEATING *NAUSEA AND VOMITING THAT IS NOT CONTROLLED WITH YOUR NAUSEA MEDICATION *UNUSUAL SHORTNESS OF BREATH *UNUSUAL BRUISING OR BLEEDING *URINARY PROBLEMS (pain or burning when urinating, or frequent urination) *BOWEL PROBLEMS (unusual diarrhea, constipation, pain near the anus) TENDERNESS IN MOUTH AND THROAT WITH OR WITHOUT PRESENCE OF ULCERS (sore throat, sores in mouth, or a toothache) UNUSUAL RASH, SWELLING OR PAIN  UNUSUAL VAGINAL DISCHARGE OR ITCHING   Items with * indicate a potential emergency and should be followed up as soon as possible or go to the Emergency Department if any problems should occur.  Please show the CHEMOTHERAPY ALERT CARD or IMMUNOTHERAPY ALERT CARD at check-in  to the Emergency Department and triage nurse.  Should you have questions after your visit or need to cancel or reschedule your appointment, please contact [facility]  Dept: [phone]  and follow the prompts.  Office hours are 8:00 a.m. to 4:30 p.m. Monday - Friday. Please note that voicemails left after 4:00 p.m. may not be returned until the following business day.  We are closed weekends and major holidays. You have access to a nurse at all times for urgent questions. Please call the main number to the clinic Dept: [phone] and follow the prompts.   For any non-urgent questions, you may also contact your provider using MyChart. We now offer e-Visits for anyone 44 and older to request care online for non-urgent symptoms. For details visit [email or web address].   Also download the MyChart app! Go to the app store, search "MyChart", open the app, select [facility], and log in with your MyChart username and password.  Masks are optional in the cancer centers. If you would like for your care team to wear a mask while they are taking care of you, please let them know. You may have one support person who is at least 65 years old accompany you for your appointments.

## 2021-10-10 NOTE — Patient Instructions (Signed)

## 2021-10-10 NOTE — Progress Notes (Signed)
Nutrition Follow-up  Met with patient in infusion. She is snacking on peanut butter and graham crackers at visit. Patient reports having a "really good" month. She has been completing projects around the house and happy with recent kitchen update. Patient has been more active and is eating more. She has started eating breakfast (pancakes, egg/cheese sandwich). Patient likes peanut butter/strawberry preserve sandwiches. Eats these ~5x/week. She likes to have something sweet after supper. Recently this has been a couple chocolate donuts. Patient denies nausea, vomiting, diarrhea. She is taking miralax daily for constipation. This is working well. Patient endorses charlie horses ~5 nights/week. This began about 6 weeks ago. She forgot to mention this to doctor today. Infusion RN has made him aware of this via secure chat.    Medications: reviewed    Labs: Cr 1.15, glucose 103, Ca 8.4   Anthropometrics: Weight 200 lb 4.8 oz today increased (pt is taking lasix 20 mg/day)  8/23 - 198 lb 4.8 oz 8/2 - 203 lb 12.8 oz  7/21 - 198 lb 9.6 oz     NUTRITION DIAGNOSIS:  Inadequate oral intake improving    INTERVENTION:  Continue strategies for increasing calories and protein  Continue bowel regimen Encouraged high protein snacks in between meals   MONITORING, EVALUATION, GOAL: weight trends, intake   Next Visit: Wednesday October 4 during infusion

## 2021-10-11 ENCOUNTER — Encounter: Payer: Self-pay | Admitting: Hematology and Oncology

## 2021-10-11 MED ORDER — METHADONE HCL 5 MG PO TABS: 5.0000 mg | ORAL_TABLET | Freq: Two times a day (BID) | ORAL | 0 refills | Status: DC

## 2021-10-12 ENCOUNTER — Ambulatory Visit: Payer: Medicare Other | Admitting: Internal Medicine

## 2021-10-12 ENCOUNTER — Other Ambulatory Visit: Payer: Self-pay | Admitting: *Deleted

## 2021-10-12 ENCOUNTER — Telehealth: Payer: Self-pay | Admitting: *Deleted

## 2021-10-12 NOTE — Telephone Encounter (Signed)
TCT patient as she had left a vm message earlier about having a sore throat and a runny nose. Spoke to pt and she said she got a prescription for Diflucan (she thinks she has thrush). She has started this and will call on Monday to let us know how she feels. No other questions or concerns

## 2021-10-15 DIAGNOSIS — M5459 Other low back pain: Secondary | ICD-10-CM | POA: Diagnosis not present

## 2021-10-19 ENCOUNTER — Other Ambulatory Visit: Payer: Self-pay | Admitting: Physician Assistant

## 2021-10-19 DIAGNOSIS — R609 Edema, unspecified: Secondary | ICD-10-CM

## 2021-10-22 ENCOUNTER — Encounter: Payer: Self-pay | Admitting: Physician Assistant

## 2021-10-22 ENCOUNTER — Encounter: Payer: Self-pay | Admitting: Physical Medicine and Rehabilitation

## 2021-10-22 ENCOUNTER — Encounter
Payer: Medicare Other | Attending: Physical Medicine and Rehabilitation | Admitting: Physical Medicine and Rehabilitation

## 2021-10-22 VITALS — BP 112/70 | HR 82 | Wt 207.0 lb

## 2021-10-22 DIAGNOSIS — Z79891 Long term (current) use of opiate analgesic: Secondary | ICD-10-CM | POA: Insufficient documentation

## 2021-10-22 DIAGNOSIS — G894 Chronic pain syndrome: Secondary | ICD-10-CM | POA: Insufficient documentation

## 2021-10-22 DIAGNOSIS — Z5181 Encounter for therapeutic drug level monitoring: Secondary | ICD-10-CM | POA: Insufficient documentation

## 2021-10-22 MED ORDER — METHADONE HCL 5 MG PO TABS: 5.0000 mg | ORAL_TABLET | Freq: Two times a day (BID) | ORAL | 0 refills | Status: DC

## 2021-10-22 MED ORDER — HYDROCODONE-ACETAMINOPHEN 10-325 MG PO TABS: ORAL_TABLET | ORAL | 0 refills | Status: DC

## 2021-10-22 MED ORDER — LEVOTHYROXINE SODIUM 137 MCG PO TABS: 137.0000 ug | ORAL_TABLET | Freq: Every day | ORAL | 5 refills | Status: DC

## 2021-10-22 NOTE — Patient Instructions (Signed)
Pt is a 66 yr old female with hx of newly dx'd COPD, still smoking, HTN, DM- diet controlled; anxiety and depression;  Scoliosis and deg back issues,  Generalized OA;  chronic pain- here for f/u of chronic pain issues. With new 2.2 cm R lower pulmonary nodule and hemorrhoids.  Now has dx of Small cell lung cancer extensive stage per oncology note.  Will refill methadone a little early- so can pick up at pharmacy next month on time- 5 mg 2x/day.   2. Hydrocodone 10/325 mg up to 5 pills/day- #150- is due for refills.    3. Synthroid 137 mcg daily- will refill today.    4. Discussed making sure meds under lock and kepy.   5. Discussed I'd like her to continue immunotherapy for lung cancer-   6. Discussed ways to keep house peace.    7. F/u in 3 months-
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
## 2021-10-24 ENCOUNTER — Telehealth: Payer: Self-pay | Admitting: Physician Assistant

## 2021-10-24 DIAGNOSIS — H353112 Nonexudative age-related macular degeneration, right eye, intermediate dry stage: Secondary | ICD-10-CM | POA: Diagnosis not present

## 2021-10-24 DIAGNOSIS — H25093 Other age-related incipient cataract, bilateral: Secondary | ICD-10-CM | POA: Diagnosis not present

## 2021-10-24 DIAGNOSIS — H04123 Dry eye syndrome of bilateral lacrimal glands: Secondary | ICD-10-CM | POA: Diagnosis not present

## 2021-10-24 DIAGNOSIS — H524 Presbyopia: Secondary | ICD-10-CM | POA: Diagnosis not present

## 2021-10-24 DIAGNOSIS — H353221 Exudative age-related macular degeneration, left eye, with active choroidal neovascularization: Secondary | ICD-10-CM | POA: Diagnosis not present

## 2021-10-24 NOTE — Telephone Encounter (Signed)
Patient Name: [name] Gender: Female DOB: 05/30/1955 Age: 66 Y 44 M 25 D Return Phone Number: [phone] (Primary) Address: City/ State/ Zip: [place] [address] Client [facility] Paediatric nurse, Freight forwarder- PA Contact Type Call Who Is Calling Patient / Member / Family / Caregiver Call Type Triage / Clinical Relationship To Patient Self Return Phone Number [phone] (Primary) Chief Complaint Unknown Complaint Reason for Call Symptomatic / Request for [place] states that feet are and feel like they are on fire. Translation No Nurse Assessment Nurse: Clovis Riley, RN, Georgina Peer Date/Time (Eastern Time): 10/24/2021 11:03:02 AM Confirm and document reason for call. If symptomatic, describe symptoms. ---Caller states both of her feet feel like they are on fire this morning when she got up. States she takes hydrocodone for other problems. She is on prednisone for back problems. Does the patient have any new or worsening symptoms? ---Yes Will a triage be completed? ---Yes Related visit to physician within the last 2 weeks? ---No Does the PT have any chronic conditions? (i.e. diabetes, asthma, this includes High risk factors for pregnancy, etc.) ---Yes List chronic conditions. ---small cell lung cancer, Immunotherapy 2 weeks ago. Is this a behavioral health or substance abuse call? ---No Guidelines Guideline Title Affirmed Question Affirmed Notes Nurse Date/Time Eilene Ghazi Time) Foot Pain [1] Redness of the skin AND [2] no fever Clovis Riley, RNGeorgina Peer 10/24/2021 11:05:25 AM Disp. Time Eilene Ghazi Time) Disposition Final User 10/24/2021 11:08:03 AM See PCP within 24 Hours Yes [place], RN, Georgina Peer PLEASE NOTE: All timestamps contained within this report are represented as [place] Standard Time. CONFIDENTIALTY NOTICE: This fax transmission is intended  only for the addressee. It contains information that is legally privileged, confidential or otherwise protected from use or disclosure. If you are not the intended recipient, you are strictly prohibited from reviewing, disclosing, copying using or disseminating any of this information or taking any action in reliance on or regarding this information. If you have received this fax in error, please notify us immediately by telephone so that we can arrange for its return to [place]. Phone: [phone], Toll-Free: [phone], Fax: [phone] Page: 2 of 2 Call Id: [id number] Final Disposition 10/24/2021 11:08:03 AM See PCP within 24 Hours Yes Clovis Riley, RN, Leilani Merl Disagree/Comply Comply Caller Understands Yes PreDisposition Call Doctor Care Advice Given Per Guideline SEE PCP WITHIN 24 HOURS: * IF OFFICE WILL BE OPEN: You need to be examined within the next 24 hours. Call your doctor (or NP/PA) when the office opens and make an appointment. CALL BACK IF: * Fever occurs * You become worse CARE ADVICE given per Foot Pain (Adult) guideline. Referrals REFERRED TO PCP OFFICE

## 2021-10-24 NOTE — Telephone Encounter (Signed)
Patient states: -She woke up this morning to her feet being red and "feeling like they're on fire" - She was informed by PCP that she does have neuropathy in hands and feet - She isn't sure if this is normal or not  I offered patient an OV today, 09/27, but she declined due to having another appointment at the same time offered. Patient has been transferred to triage.

## 2021-10-24 NOTE — Telephone Encounter (Signed)
Please see triage note; pt was offered same day appt this morning and declined, then sent to triage. Please advise

## 2021-10-25 ENCOUNTER — Telehealth: Payer: Self-pay | Admitting: *Deleted

## 2021-10-25 ENCOUNTER — Encounter: Payer: Self-pay | Admitting: Physical Medicine and Rehabilitation

## 2021-10-25 LAB — TOXASSURE SELECT,+ANTIDEPR,UR

## 2021-10-25 MED ORDER — CYCLOBENZAPRINE HCL 10 MG PO TABS: 10.0000 mg | ORAL_TABLET | Freq: Three times a day (TID) | ORAL | 5 refills | Status: DC | PRN

## 2021-10-25 NOTE — Telephone Encounter (Signed)
Called and spoke with pt, pt advised feeling much better today feet and legs looking better today. Advised pt if symptoms get worse with redness, burning, fever, etc tocall schedule VV/ ofce visit or can be seen at Urgent care. Pt confirmed understanding

## 2021-10-25 NOTE — Telephone Encounter (Signed)
Urine drug screen for this encounter is consistent for prescribed medication 

## 2021-10-26 DIAGNOSIS — M5416 Radiculopathy, lumbar region: Secondary | ICD-10-CM | POA: Diagnosis not present

## 2021-10-27 ENCOUNTER — Ambulatory Visit (HOSPITAL_COMMUNITY)
Admission: RE | Admit: 2021-10-27 | Discharge: 2021-10-27 | Disposition: A | Discharge status: Home / Self Care | Payer: Medicare Other | Source: Ambulatory Visit | Attending: Hematology and Oncology | Admitting: Hematology and Oncology

## 2021-10-27 DIAGNOSIS — C3490 Malignant neoplasm of unspecified part of unspecified bronchus or lung: Secondary | ICD-10-CM | POA: Diagnosis not present

## 2021-10-27 DIAGNOSIS — D3502 Benign neoplasm of left adrenal gland: Secondary | ICD-10-CM | POA: Diagnosis not present

## 2021-10-27 DIAGNOSIS — J432 Centrilobular emphysema: Secondary | ICD-10-CM | POA: Diagnosis not present

## 2021-10-27 DIAGNOSIS — R110 Nausea: Secondary | ICD-10-CM | POA: Diagnosis not present

## 2021-10-27 DIAGNOSIS — M549 Dorsalgia, unspecified: Secondary | ICD-10-CM | POA: Diagnosis not present

## 2021-10-27 DIAGNOSIS — I700 Atherosclerosis of aorta: Secondary | ICD-10-CM | POA: Diagnosis not present

## 2021-10-27 MED ORDER — IOHEXOL 300 MG/ML  SOLN
100.0000 mL | Freq: Once | INTRAMUSCULAR | Status: AC | PRN
  Administered 2021-10-27: 100 mL via INTRAVENOUS

## 2021-10-30 DIAGNOSIS — M5459 Other low back pain: Secondary | ICD-10-CM | POA: Diagnosis not present

## 2021-10-31 ENCOUNTER — Inpatient Hospital Stay: Payer: Medicare Other | Attending: Hematology and Oncology

## 2021-10-31 ENCOUNTER — Inpatient Hospital Stay (HOSPITAL_BASED_OUTPATIENT_CLINIC_OR_DEPARTMENT_OTHER): Payer: Medicare Other | Admitting: Hematology and Oncology

## 2021-10-31 ENCOUNTER — Inpatient Hospital Stay: Payer: Medicare Other | Admitting: Dietician

## 2021-10-31 ENCOUNTER — Inpatient Hospital Stay: Payer: Medicare Other

## 2021-10-31 VITALS — BP 121/78 | HR 90 | Resp 18

## 2021-10-31 DIAGNOSIS — Z885 Allergy status to narcotic agent status: Secondary | ICD-10-CM | POA: Insufficient documentation

## 2021-10-31 DIAGNOSIS — I700 Atherosclerosis of aorta: Secondary | ICD-10-CM | POA: Diagnosis not present

## 2021-10-31 DIAGNOSIS — Z880 Allergy status to penicillin: Secondary | ICD-10-CM | POA: Diagnosis not present

## 2021-10-31 DIAGNOSIS — Z809 Family history of malignant neoplasm, unspecified: Secondary | ICD-10-CM | POA: Insufficient documentation

## 2021-10-31 DIAGNOSIS — Z811 Family history of alcohol abuse and dependence: Secondary | ICD-10-CM | POA: Insufficient documentation

## 2021-10-31 DIAGNOSIS — I10 Essential (primary) hypertension: Secondary | ICD-10-CM | POA: Diagnosis not present

## 2021-10-31 DIAGNOSIS — C3490 Malignant neoplasm of unspecified part of unspecified bronchus or lung: Secondary | ICD-10-CM

## 2021-10-31 DIAGNOSIS — Z836 Family history of other diseases of the respiratory system: Secondary | ICD-10-CM | POA: Insufficient documentation

## 2021-10-31 DIAGNOSIS — R600 Localized edema: Secondary | ICD-10-CM | POA: Diagnosis not present

## 2021-10-31 DIAGNOSIS — R200 Anesthesia of skin: Secondary | ICD-10-CM | POA: Diagnosis not present

## 2021-10-31 DIAGNOSIS — C3431 Malignant neoplasm of lower lobe, right bronchus or lung: Secondary | ICD-10-CM | POA: Insufficient documentation

## 2021-10-31 DIAGNOSIS — R0600 Dyspnea, unspecified: Secondary | ICD-10-CM | POA: Diagnosis not present

## 2021-10-31 DIAGNOSIS — Z814 Family history of other substance abuse and dependence: Secondary | ICD-10-CM | POA: Insufficient documentation

## 2021-10-31 DIAGNOSIS — R110 Nausea: Secondary | ICD-10-CM | POA: Diagnosis not present

## 2021-10-31 DIAGNOSIS — Z888 Allergy status to other drugs, medicaments and biological substances status: Secondary | ICD-10-CM | POA: Insufficient documentation

## 2021-10-31 DIAGNOSIS — G8929 Other chronic pain: Secondary | ICD-10-CM | POA: Diagnosis not present

## 2021-10-31 DIAGNOSIS — Z5112 Encounter for antineoplastic immunotherapy: Secondary | ICD-10-CM | POA: Insufficient documentation

## 2021-10-31 DIAGNOSIS — M79604 Pain in right leg: Secondary | ICD-10-CM | POA: Diagnosis not present

## 2021-10-31 DIAGNOSIS — Z823 Family history of stroke: Secondary | ICD-10-CM | POA: Insufficient documentation

## 2021-10-31 DIAGNOSIS — Z90721 Acquired absence of ovaries, unilateral: Secondary | ICD-10-CM | POA: Insufficient documentation

## 2021-10-31 DIAGNOSIS — Z79890 Hormone replacement therapy (postmenopausal): Secondary | ICD-10-CM | POA: Diagnosis not present

## 2021-10-31 DIAGNOSIS — J449 Chronic obstructive pulmonary disease, unspecified: Secondary | ICD-10-CM | POA: Insufficient documentation

## 2021-10-31 DIAGNOSIS — Z825 Family history of asthma and other chronic lower respiratory diseases: Secondary | ICD-10-CM | POA: Insufficient documentation

## 2021-10-31 DIAGNOSIS — Z8261 Family history of arthritis: Secondary | ICD-10-CM | POA: Insufficient documentation

## 2021-10-31 DIAGNOSIS — Z882 Allergy status to sulfonamides status: Secondary | ICD-10-CM | POA: Insufficient documentation

## 2021-10-31 DIAGNOSIS — Z5986 Financial insecurity: Secondary | ICD-10-CM | POA: Diagnosis not present

## 2021-10-31 DIAGNOSIS — F17210 Nicotine dependence, cigarettes, uncomplicated: Secondary | ICD-10-CM | POA: Diagnosis not present

## 2021-10-31 DIAGNOSIS — Z79899 Other long term (current) drug therapy: Secondary | ICD-10-CM | POA: Diagnosis not present

## 2021-10-31 DIAGNOSIS — E119 Type 2 diabetes mellitus without complications: Secondary | ICD-10-CM | POA: Insufficient documentation

## 2021-10-31 DIAGNOSIS — G629 Polyneuropathy, unspecified: Secondary | ICD-10-CM | POA: Insufficient documentation

## 2021-10-31 DIAGNOSIS — E876 Hypokalemia: Secondary | ICD-10-CM | POA: Insufficient documentation

## 2021-10-31 DIAGNOSIS — Z8719 Personal history of other diseases of the digestive system: Secondary | ICD-10-CM | POA: Insufficient documentation

## 2021-10-31 DIAGNOSIS — M79605 Pain in left leg: Secondary | ICD-10-CM | POA: Diagnosis not present

## 2021-10-31 DIAGNOSIS — M7989 Other specified soft tissue disorders: Secondary | ICD-10-CM | POA: Insufficient documentation

## 2021-10-31 DIAGNOSIS — Z95828 Presence of other vascular implants and grafts: Secondary | ICD-10-CM

## 2021-10-31 DIAGNOSIS — Z9049 Acquired absence of other specified parts of digestive tract: Secondary | ICD-10-CM | POA: Insufficient documentation

## 2021-10-31 DIAGNOSIS — Z886 Allergy status to analgesic agent status: Secondary | ICD-10-CM | POA: Insufficient documentation

## 2021-10-31 DIAGNOSIS — M549 Dorsalgia, unspecified: Secondary | ICD-10-CM | POA: Insufficient documentation

## 2021-10-31 DIAGNOSIS — Z8249 Family history of ischemic heart disease and other diseases of the circulatory system: Secondary | ICD-10-CM | POA: Insufficient documentation

## 2021-10-31 DIAGNOSIS — E785 Hyperlipidemia, unspecified: Secondary | ICD-10-CM | POA: Diagnosis not present

## 2021-10-31 DIAGNOSIS — Z820 Family history of epilepsy and other diseases of the nervous system: Secondary | ICD-10-CM | POA: Insufficient documentation

## 2021-10-31 LAB — CBC WITH DIFFERENTIAL (CANCER CENTER ONLY)
Abs Immature Granulocytes: 0.05 10*3/uL (ref 0.00–0.07)
Basophils Absolute: 0 10*3/uL (ref 0.0–0.1)
Basophils Relative: 0 %
Eosinophils Absolute: 0 10*3/uL (ref 0.0–0.5)
Eosinophils Relative: 1 %
HCT: 37.5 % (ref 36.0–46.0)
Hemoglobin: 12.1 g/dL (ref 12.0–15.0)
Immature Granulocytes: 1 %
Lymphocytes Relative: 10 %
Lymphs Abs: 0.8 10*3/uL (ref 0.7–4.0)
MCH: 27.3 pg (ref 26.0–34.0)
MCHC: 32.3 g/dL (ref 30.0–36.0)
MCV: 84.7 fL (ref 80.0–100.0)
Monocytes Absolute: 0.2 10*3/uL (ref 0.1–1.0)
Monocytes Relative: 2 %
Neutro Abs: 6.8 10*3/uL (ref 1.7–7.7)
Neutrophils Relative %: 86 %
Platelet Count: 179 10*3/uL (ref 150–400)
RBC: 4.43 MIL/uL (ref 3.87–5.11)
RDW: 18.3 % — ABNORMAL HIGH (ref 11.5–15.5)
WBC Count: 7.9 10*3/uL (ref 4.0–10.5)
nRBC: 0 % (ref 0.0–0.2)

## 2021-10-31 LAB — CMP (CANCER CENTER ONLY)
ALT: 18 U/L (ref 0–44)
AST: 23 U/L (ref 15–41)
Albumin: 3.5 g/dL (ref 3.5–5.0)
Alkaline Phosphatase: 80 U/L (ref 38–126)
Anion gap: 4 — ABNORMAL LOW (ref 5–15)
BUN: 9 mg/dL (ref 8–23)
CO2: 35 mmol/L — ABNORMAL HIGH (ref 22–32)
Calcium: 8.3 mg/dL — ABNORMAL LOW (ref 8.9–10.3)
Chloride: 93 mmol/L — ABNORMAL LOW (ref 98–111)
Creatinine: 0.95 mg/dL (ref 0.44–1.00)
GFR, Estimated: >60 mL/min (ref >60)
Glucose, Bld: 135 mg/dL — ABNORMAL HIGH (ref 70–99)
Potassium: 3.1 mmol/L — ABNORMAL LOW (ref 3.5–5.1)
Sodium: 132 mmol/L — ABNORMAL LOW (ref 135–145)
Total Bilirubin: 0.5 mg/dL (ref 0.3–1.2)
Total Protein: 5.7 g/dL — ABNORMAL LOW (ref 6.5–8.1)

## 2021-10-31 LAB — TSH: TSH: 0.595 u[IU]/mL (ref 0.350–4.500)

## 2021-10-31 MED ORDER — SODIUM CHLORIDE 0.9% FLUSH
10.0000 mL | INTRAVENOUS | Status: DC | PRN
  Administered 2021-10-31: 10 mL

## 2021-10-31 MED ORDER — SODIUM CHLORIDE 0.9 % IV SOLN
1200.0000 mg | Freq: Once | INTRAVENOUS | Status: AC
  Administered 2021-10-31: 1200 mg via INTRAVENOUS
  Filled 2021-10-31: qty 20

## 2021-10-31 MED ORDER — SODIUM CHLORIDE 0.9 % IV SOLN: Freq: Once | INTRAVENOUS | Status: AC

## 2021-10-31 MED ORDER — HEPARIN SOD (PORK) LOCK FLUSH 100 UNIT/ML IV SOLN
500.0000 [IU] | Freq: Once | INTRAVENOUS | Status: AC | PRN
  Administered 2021-10-31: 500 [IU]

## 2021-10-31 MED ORDER — SODIUM CHLORIDE 0.9% FLUSH
10.0000 mL | Freq: Once | INTRAVENOUS | Status: AC
  Administered 2021-10-31: 10 mL

## 2021-10-31 NOTE — Progress Notes (Signed)
[place] [phone]   [phone]  PROGRESS NOTE  Patient Care Team: Allwardt, Randa Evens, PA-C as PCP - General (Physician Assistant) Milus Banister, MD as Attending Physician (Gastroenterology) Dr. Darylene Price, MD as Consulting Physician (Orthopedic Surgery) Latanya Maudlin, MD as Consulting Physician (Orthopedic Surgery) Kathrynn Ducking, MD (Inactive) as Consulting Physician (Neurology) Okey Regal, [place] as Consulting Physician (Optometry) Delice Lesch Lezlie Octave, MD as Consulting Physician (Neurology) Edythe Clarity, [facility] (Pharmacist)  Hematological/Oncological History # Small Cell Lung Cancer, Extensive Stage 07/05/2020: CT abdomen for lower abdominal pain. New pulmonary nodular density noted 07/06/2020: CT chest showed 2.2 cm macrolobulated right lower lobe pulmonary nodule (favored) versus pathologically enlarged infrahilar lymph node 10/13/2020: PET CT scan performed, findings show 2 cm right lower lobe lung mass is hypermetabolic and consistent with primary lung neoplasm. Additionally found hypermetabolic 17 mm soft tissue lesion between the descending duodenum and the pancreatic head  10/19/2020: EGD to biopsy hypermetabolic lymph node. Biopsy results show small cell lung cancer 10/26/2020: establish care with Dr. Lorenso Courier  11/13/2020: Cycle 1 Day 1 of Carbo/Etop/Atezolizumab 12/04/2020: Cycle 2 Day 1 of Carbo/Etop/Atezolizumab 11/22-11/25/2022: admitted for E. Coli bacteremia/sepsis. Start of Cycle 3 delayed. 01/02/2021: Cycle 3 Day 1 of Carbo/Etop/Atezolizumab 01/23/2021: Cycle 4 Day 1 of Carbo/Etop/Atezolizumab 02/22/2021: Cycle 5 Day 1 of Atezolizumab Maintenance. Delayed start due to patient's COVID infection.  03/14/2021: Cycle 6 Day 1 of Atezolizumab Maintenance 04/05/2021: Cycle 7 Day 1 of Atezolizumab Maintenance 04/25/2021: Cycle 8 Day 1 of Atezolizumab Maintenance 05/16/2021: Cycle 9 Day 1 of Atezolizumab Maintenance 06/06/2021:  Cycle 10 Day 1 of Atezolizumab Maintenance 06/27/2021:  Cycle 11 Day 1 of Atezolizumab Maintenance 07/18/2021: Cycle 12 Day 1 of Atezolizumab Maintenance 08/08/2021: Cycle 13 Day 1 of Atezolizumab Maintenance 08/29/2021: Cycle 14 Day 1 of Atezolizumab Maintenance 09/19/2021: Cycle 15 Day 1 of Atezolizumab Maintenance 10/10/2021: Cycle 16 Day 1 of Atezolizumab Maintenance 10/31/2021: Cycle 17 Day 1 of Atezolizumab Maintenance  Interval History:  [name] 66 y.o. female with medical history significant for extensive stage small cell lung cancer who presents for a follow up visit. The patient's last visit was on 10/10/2021. In the interim since the last visit she has completed cycle 16 of chemotherapy. She presents today to start cycle 17 of chemotherapy.  On exam today [name] reports been tolerating treatment well.  She reports that it is "rough the first few days".  She notes that things have not improved with subsequent cycles.  She reports that she does mostly suffer from fatigue and sleeps a lot after treatment.  She does have some nausea but no vomiting or diarrhea.  Her appetite has been good though she "eats the wrong things".  She does enjoy eating sweets and cakes and pancakes.  She reports that she is currently on a steroid taper due to back pain.  It does not appear to be helping too much.  She reports that she will be undergoing a back steroid injection if this fails.  She notes that she is continue to take potassium pills and is not having any symptoms from her low potassium today.  She denies fevers, chills, night sweats, chest pain or cough.She has no other complaints. A full 10 point ROS is listed below.  Overall she is willing and able to proceed with treatment today.   MEDICAL HISTORY:  Past Medical History:  Diagnosis Date   Allergic rhinitis    Anemia    Anxiety  Chicken pox    Chronic back pain    COPD (chronic obstructive pulmonary disease) (HCC)    Depression     DM (diabetes mellitus) ([place])    Essential hypertension    GERD (gastroesophageal reflux disease)    Headache    migraines   History of gastritis    EGD 2015   History of home oxygen therapy    2 liters at hs last 6 months   Hyperlipidemia    Hypothyroidism    Migraines    Osteoarthritis    oa   Scoliosis     SURGICAL HISTORY: Past Surgical History:  Procedure Laterality Date   APPENDECTOMY     1985   BIOPSY  07/24/2017   Procedure: BIOPSY;  Surgeon: Milus Banister, MD;  Location: [facility];  Service: Endoscopy;;   CARDIAC CATHETERIZATION N/A 10/31/2015   Procedure: Left Heart Cath and Coronary Angiography;  Surgeon: Leonie Man, MD;  Location: [facility];  Service: Cardiovascular;  Laterality: N/A;   CARPAL TUNNEL RELEASE Left    CARPAL TUNNEL RELEASE Right    CHOLECYSTECTOMY  late 1980's   COLONOSCOPY WITH PROPOFOL N/A 07/24/2017   Procedure: COLONOSCOPY WITH PROPOFOL;  Surgeon: Milus Banister, MD;  [facility]: [facility];  Service: Endoscopy;  Laterality: N/A;   ESOPHAGOGASTRODUODENOSCOPY N/A 07/24/2017   Procedure: ESOPHAGOGASTRODUODENOSCOPY (EGD);  Surgeon: Milus Banister, MD;  Location: [facility];  Service: Endoscopy;  Laterality: N/A;   ESOPHAGOGASTRODUODENOSCOPY (EGD) WITH PROPOFOL N/A 10/19/2020   Procedure: ESOPHAGOGASTRODUODENOSCOPY (EGD) WITH PROPOFOL;  Surgeon: Milus Banister, MD;  [facility];  Service: Endoscopy;  Laterality: N/A;   FINE NEEDLE ASPIRATION N/A 10/19/2020   Procedure: FINE NEEDLE ASPIRATION (FNA) LINEAR;  Surgeon: Milus Banister, MD;  [facility]: [facility];  Service: Endoscopy;  Laterality: N/A;   GALLBLADDER SURGERY  1991   HIP CLOSED REDUCTION Right 01/08/2016   Procedure: CLOSED MANIPULATION HIP;  Surgeon: Susa Day, MD;  Location: [facility];  Service: Orthopedics;  Laterality: Right;   HIP CLOSED REDUCTION Right 01/19/2016   Procedure: ATTEMPTED CLOSED REDUCTION RIGHT HIP;  Surgeon: Wylene Simmer, MD;   Location: [facility];  Service: Orthopedics;  Laterality: Right;   HIP CLOSED REDUCTION Right 01/20/2016   Procedure: CLOSED REDUCTION RIGHT TOTAL HIP;  Surgeon: Paralee Cancel, MD;  Location: [facility];  Service: Orthopedics;  Laterality: Right;   HIP CLOSED REDUCTION Right 02/17/2016   Procedure: CLOSED REDUCTION RIGHT TOTAL HIP;  Surgeon: Rod Can, MD;  Location: [place];  Service: Orthopedics;  Laterality: Right;   HIP CLOSED REDUCTION Right 02/28/2016   Procedure: CLOSED REDUCTION HIP;  Surgeon: Nicholes Stairs, MD;  Location: [facility];  Service: Orthopedics;  Laterality: Right;   IR IMAGING GUIDED PORT INSERTION  11/01/2020   POLYPECTOMY  07/24/2017   Procedure: POLYPECTOMY;  Surgeon: Milus Banister, MD;  [facility]: [facility];  Service: Endoscopy;;   [place], with 1 ovary removed and 2 nd ovary removed 2003   TOTAL HIP ARTHROPLASTY Right    Original surgery 2006 with revision 2010   TOTAL HIP REVISION Right 01/01/2016   Procedure: TOTAL HIP REVISION;  Surgeon: Paralee Cancel, MD;  Location: [facility];  Service: Orthopedics;  Laterality: Right;   TOTAL HIP REVISION Right 03/02/2016   Procedure: TOTAL HIP REVISION;  Surgeon: Paralee Cancel, MD;  Location: [facility];  Service: Orthopedics;  Laterality: Right;   TOTAL HIP REVISION Right 09/02/2016  Procedure: Right hip constrained liner- posterior;  Surgeon: Paralee Cancel, MD;  Location: [facility];  Service: Orthopedics;  Laterality: Right;   ULNAR NERVE TRANSPOSITION Right    UPPER ESOPHAGEAL ENDOSCOPIC ULTRASOUND (EUS) N/A 10/19/2020   Procedure: UPPER ESOPHAGEAL ENDOSCOPIC ULTRASOUND (EUS);  Surgeon: Milus Banister, MD;  Location: [facility];  Service: Endoscopy;  Laterality: N/A;  periduodenal lesion    SOCIAL HISTORY: Social History   Socioeconomic History   Marital status: Married    Spouse name: Not on file   Number of children: 2   Years of education: Not on file   Highest education level:  Not on file  Occupational History   Occupation: disabled   Occupation: disabled  Tobacco Use   Smoking status: Every Day    Packs/day: 2.00    Years: 46.00    Total pack years: 92.00    Types: Cigarettes   Smokeless tobacco: Never   Tobacco comments:    2 packs of cigarettes smoked daily ARJ 04/10/21  Vaping Use   Vaping Use: Never used  Substance and Sexual Activity   Alcohol use: No   Drug use: No   Sexual activity: Not Currently    Partners: Male  Other Topics Concern   Not on file  Social History Narrative   Right handed    Caffeine~ 2 cups per day    Lives at home with husband (strained relationship)   Primary caretaker for disabled brother who had aneurism   Daughter died 2018/06/21    Social Determinants of Health   Financial Resource Strain: Medium Risk (05/03/2021)   Overall Financial Resource Strain (CARDIA)    Difficulty of Paying Living Expenses: Somewhat hard  Food Insecurity: No Food Insecurity (05/03/2021)   Hunger Vital Sign    Worried About Running Out of Food in the Last Year: Never true    Ran Out of Food in the Last Year: Never true  Transportation Needs: No Transportation Needs (12/27/2019)   PRAPARE - Hydrologist (Medical): No    Lack of Transportation (Non-Medical): No  Physical Activity: Inactive (05/03/2021)   Exercise Vital Sign    Days of Exercise per Week: 0 days    Minutes of Exercise per Session: 0 min  Stress: Stress Concern Present (05/03/2021)   [place]    Feeling of Stress : To some extent  Social Connections: Moderately Integrated (05/03/2021)   Social Connection and Isolation Panel [NHANES]    Frequency of Communication with Friends and Family: More than three times a week    Frequency of Social Gatherings with Friends and Family: Once a week    Attends Religious Services: 1 to 4 times per year    Active Member of [employer] or Organizations: No     Attends Archivist Meetings: Never    Marital Status: Married  Human resources officer Violence: Not At Risk (05/03/2021)   Humiliation, Afraid, Rape, and Kick questionnaire    Fear of Current or Ex-Partner: No    Emotionally Abused: No    Physically Abused: No    Sexually Abused: No    FAMILY HISTORY: Family History  Problem Relation Age of Onset   COPD Mother    Heart disease Mother    Lung disease Father        Asbestosis   Heart attack Father    Heart disease Father    Cerebral aneurysm Brother    Aneurysm Brother  Brain   Drug abuse Daughter    Epilepsy Son    Alcohol abuse Son    Drug abuse Son    Arthritis Maternal Grandmother    Heart disease Maternal Grandmother    Asthma Maternal Grandfather    Cancer Maternal Grandfather    Arthritis Paternal Grandmother    Heart disease Paternal Grandmother    Stroke Paternal Grandmother    Early death Paternal Grandfather    Heart disease Paternal Grandfather     ALLERGIES:  is allergic to metformin and related, nsaids, wellbutrin [bupropion], aleve [naproxen sodium], codeine, penicillins, and sulfonamide derivatives.  MEDICATIONS:  Current Outpatient Medications  Medication Sig Dispense Refill   predniSONE (DELTASONE) 10 MG tablet take 1 tab three times a day for 2 days, 1 tab twice a day for 5 days, 1 tab daily till finished     albuterol (PROAIR HFA) 108 (90 Base) MCG/ACT inhaler 2 puffs every 4 hours as needed only  if your can't catch your breath 18 g 3   ALPRAZolam (XANAX) 1 MG tablet TAKE 1 TABLET BY MOUTH THREE TIMES DAILY AS NEEDED FOR ANXIETY 90 tablet 2   atorvastatin (LIPITOR) 20 MG tablet TAKE 1 TABLET BY MOUTH AT BEDTIME 90 tablet 1   benzonatate (TESSALON) 100 MG capsule TAKE 1 CAPSULE BY MOUTH THREE TIMES DAILY 30 capsule 1   Budeson-Glycopyrrol-Formoterol (BREZTRI AEROSPHERE) 160-9-4.8 MCG/ACT AERO Inhale 2 puffs into the lungs in the morning and at bedtime. 10.7 g 3   cyclobenzaprine (FLEXERIL) 10  MG tablet Take 1 tablet (10 mg total) by mouth 3 (three) times daily as needed for muscle spasms. 90 tablet 5   DULoxetine (CYMBALTA) 60 MG capsule TAKE TWO CAPSULES BY MOUTH DAILY 60 capsule 5   fluconazole (DIFLUCAN) 100 MG tablet TAKE 2 TABLETS BY MOUTH ON DAY 1, THEN TAKE 1 TABLET EVERY DAY     fluticasone (FLONASE) 50 MCG/ACT nasal spray INSTILL 2 SPRAYS IN EACH NOSTRIL EVERY DAY 16 g 6   fluticasone-salmeterol (ADVAIR DISKUS) 250-50 MCG/ACT AEPB Inhale 1 puff into the lungs in the morning and at bedtime. 60 each 5   furosemide (LASIX) 20 MG tablet TAKE 1 TABLET BY MOUTH TWICE DAILY FOR 7 DAYS THEN TAKE 1 TABLET ONCE DAILY 37 tablet 0   HYDROcodone-acetaminophen (NORCO) 10-325 MG tablet TAKE 1 TABLET BY MOUTH EVERY 4 HOURS AS NEEDED max FIVE tablets DAILY 150 tablet 0   levothyroxine (SYNTHROID) 137 MCG tablet Take 1 tablet (137 mcg total) by mouth daily before breakfast. 30 tablet 5   loratadine (CLARITIN) 10 MG tablet Take 10 mg by mouth daily.     meloxicam (MOBIC) 15 MG tablet Take 1 tablet (15 mg total) by mouth daily. 30 tablet 2   methadone (DOLOPHINE) 5 MG tablet Take 1 tablet (5 mg total) by mouth every 12 (twelve) hours. 60 tablet 0   OLANZapine (ZYPREXA) 10 MG tablet Take 1 tablet (10 mg total) by mouth at bedtime. 30 tablet 2   omeprazole (PRILOSEC) 40 MG capsule TAKE ONE CAPSULE BY MOUTH TWICE DAILY 60 capsule 2   ondansetron (ZOFRAN) 8 MG tablet TAKE 2 TABLETS BY MOUTH TWICE DAILY AS NEEDED 60 tablet 0   potassium chloride (KLOR-CON M) 10 MEQ tablet TAKE 1 TABLET BY MOUTH AT BEDTIME 90 tablet 1   prochlorperazine (COMPAZINE) 10 MG tablet Take 1 tablet (10 mg total) by mouth every 6 (six) hours as needed for nausea or vomiting. 30 tablet 0   triamterene-hydrochlorothiazide (MAXZIDE-25) 37.5-25 MG  tablet TAKE 1 TABLET BY MOUTH DAILY - EMERGENCY REFILL FAXED DR. 90 tablet 2   No current facility-administered medications for this visit.    REVIEW OF SYSTEMS:   Constitutional: ( -  ) fevers, ( - )  chills , ( - ) night sweats Eyes: ( - ) blurriness of vision, ( - ) double vision, ( - ) watery eyes Ears, nose, mouth, throat, and face: ( - ) mucositis, ( - ) sore throat Respiratory: ( - ) cough, ( +) dyspnea, ( - ) wheezes Cardiovascular: ( - ) palpitation, ( - ) chest discomfort, ( + ) lower extremity swelling Gastrointestinal:  ( -) nausea, ( - ) heartburn, ( - ) change in bowel habits Skin: ( - ) abnormal skin rashes Lymphatics: ( - ) new lymphadenopathy, ( - ) easy bruising Neurological: (+ ) numbness, ( - ) tingling, ( - ) new weaknesses Behavioral/Psych: ( - ) mood change, ( - ) new changes  All other systems were reviewed with the patient and are negative.  PHYSICAL EXAMINATION: ECOG PERFORMANCE STATUS: 1 - Symptomatic but completely ambulatory  Vitals:   10/31/21 1028  BP: 139/71  Pulse: (!) 101  Resp: 17  Temp: 97.9 F (36.6 C)  SpO2: 92%   Filed Weights   10/31/21 1028  Weight: 205 lb (93 kg)    GENERAL: Well-appearing middle-age Caucasian female, alert, no distress and comfortable SKIN: skin color, texture, turgor are normal, no rashes or significant lesions EYES: conjunctiva are pink and non-injected, sclera clear LUNGS:  normal breathing effort. Diffuse wheezing hear on ausculation.  HEART: regular rate & rhythm and no murmurs. Mild bilateral lower extremity edema Musculoskeletal: no cyanosis of digits and no clubbing  PSYCH: alert & oriented x 3, fluent speech NEURO: no focal motor/sensory deficits  LABORATORY DATA:  I have reviewed the data as listed    Latest Ref Rng & Units 10/31/2021   10:03 AM 10/10/2021   10:58 AM 09/19/2021    9:53 AM  CBC  WBC 4.0 - 10.5 K/uL 7.9  11.4  9.0   Hemoglobin 12.0 - 15.0 g/dL 12.1  12.2  13.2   Hematocrit 36.0 - 46.0 % 37.5  38.4  40.2   Platelets 150 - 400 K/uL 179  153  219        Latest Ref Rng & Units 10/31/2021   10:03 AM 10/10/2021   10:58 AM 09/19/2021    9:53 AM  CMP  Glucose 70 - 99  mg/dL 135  103  101   BUN 8 - 23 mg/dL 9  10  14    Creatinine 0.44 - 1.00 mg/dL 0.95  1.15  1.03   Sodium 135 - 145 mmol/L 132  135  136   Potassium 3.5 - 5.1 mmol/L 3.1  3.6  3.6   Chloride 98 - 111 mmol/L 93  101  101   CO2 22 - 32 mmol/L 35  29  29   Calcium 8.9 - 10.3 mg/dL 8.3  8.4  8.7   Total Protein 6.5 - 8.1 g/dL 5.7  6.2  6.5   Total Bilirubin 0.3 - 1.2 mg/dL 0.5  0.5  0.4   Alkaline Phos 38 - 126 U/L 80  88  87   AST 15 - 41 U/L 23  15  13    ALT 0 - 44 U/L 18  15  9      No results found for: "MPROTEIN" Lab Results  Component Value Date   KPAFRELGTCHN  0.75 06/30/2014   LAMBDASER 3.78 (H) 06/30/2014   KAPLAMBRATIO 0.20 (L) 06/30/2014     RADIOGRAPHIC STUDIES: CT CHEST ABDOMEN PELVIS W CONTRAST  Result Date: 10/29/2021 CLINICAL DATA:  Small cell lung cancer, back pain and nausea. * Tracking Code: BO * EXAM: CT CHEST, ABDOMEN, AND PELVIS WITH CONTRAST TECHNIQUE: Multidetector CT imaging of the chest, abdomen and pelvis was performed following the standard protocol during bolus administration of intravenous contrast. RADIATION DOSE REDUCTION: This exam was performed according to the departmental dose-optimization program which includes automated exposure control, adjustment of the mA and/or kV according to patient size and/or use of iterative reconstruction technique. CONTRAST:  146mL OMNIPAQUE IOHEXOL 300 MG/ML  SOLN COMPARISON:  06/29/2021, PET 10/13/2020 and CT abdomen 07/05/2020. FINDINGS: CT CHEST FINDINGS Cardiovascular: Right IJ Port-A-Cath terminates in the right atrium. Atherosclerotic calcification of the aorta, aortic valve and coronary arteries. Heart size normal. No pericardial effusion. Mediastinum/Nodes: No pathologically enlarged mediastinal, hilar or axillary lymph nodes. Esophagus is grossly unremarkable. Lungs/Pleura: Centrilobular and paraseptal emphysema. Lungs are clear. No pleural fluid. Airway is unremarkable. Musculoskeletal: Degenerative changes in the  spine. No worrisome lytic or sclerotic lesions. CT ABDOMEN PELVIS FINDINGS Hepatobiliary: Liver is unremarkable. Cholecystectomy. No unexpected biliary ductal dilatation. Pancreas: Negative. Spleen: Negative. Adrenals/Urinary Tract: Right adrenal gland is unremarkable. Left adrenal nodule measures 1.7 cm and is unchanged from 09/12/2020, at which time it measured -4 Hounsfield units. No follow-up necessary. Vague low-attenuation lesion in the upper pole right kidney measures approximate 1.4 cm (2/57), unchanged from 07/05/2020. No specific follow-up necessary. Kidneys are otherwise unremarkable. Ureters are decompressed. There may be bladder wall thickening but the bladder is relatively obscured by streak artifact from a right hip arthroplasty. Stomach/Bowel: Stomach and small bowel are unremarkable. Appendix is not visualized. Stool is seen throughout the colon. Vascular/Lymphatic: Atherosclerotic calcification of the aorta. Gastrohepatic ligament and periportal lymph nodes are not enlarged by CT size criteria. No pathologically enlarged lymph nodes. Reproductive: Hysterectomy.  No adnexal mass. Other: No free fluid.  Mesenteries and peritoneum are unremarkable. Musculoskeletal: Right hip arthroplasty. Degenerative changes in the spine. Levoconvex curvature of the lumbar spine. No worrisome lytic or sclerotic lesions. IMPRESSION: 1. No evidence of recurrent or metastatic disease. 2. Left adrenal adenoma. 3. Stool throughout the colon is indicative of constipation. 4. Aortic atherosclerosis (ICD10-I70.0). Coronary artery calcification. 5.  Emphysema (ICD10-J43.9). Electronically Signed   By: Lorin Picket M.D.   On: 10/29/2021 16:23    ASSESSMENT & PLAN [name] 66 y.o. female with medical history significant for extensive stage small cell lung cancer who presents for a follow up visit.   After review of the labs, review of the records, and discussion with the patient the patients findings are most  consistent with extensive stage small cell lung cancer with metastasis from the right lower lobe to the lymph nodes of the abdomen.  At this time we will pursue triple therapy with carboplatin, etoposide, and atezolizumab.  After 4 cycles we will convert to maintenance atezolizumab alone.  We previously discussed the risks and benefits of this therapy and the patient was in agreement to proceed with this treatment.  The treatment of choice consist of carboplatin, etoposide, and atezolizumab.  The regimen consists of carboplatin AUC of 5 IV on day 1, etoposide 100 mg per metered squared IV on day 1, 2, and 3 and atezolizumab 1200 mg on day 1.  This continues for 21-day cycles.  After 4 cycles the patient proceeds with atezolizumab maintenance therapy  alone.   #Small Cell Lung Cancer, Extensive Stage -- MRI of the brain shows no evidence of intracranial spread --Findings are currently consistent with metastatic small cell lung cancer with metastatic spread to the lymph nodes of the abdomen --Prognosis is poor with anticipated life span of less than 12 months -- Patient has extensive stage due to metastatic spread to lymph nodes in the abdomen --Plan to proceed with carboplatin, etoposide, and atezolizumab --will consider prophylactic cranial radiation after start of chemotherapy.  Plan: --today is Cycle 16 Day 1 of Atezolizumab maintenance --labs today were reviewed and adequate for treatment. Hgb 12.1, white blood cell count 7.9, MCV 84.7, and platelets of 179 --CT scan q 3 months. Last scan in Sept 2023 showed continued excellent response to therapy. CT scan next in Dec 2023.  --RTC in 3 weeks for next cycle of maintenance atezolizumab   #Supportive Care -- chemotherapy education complete -- port placed -- zofran 8mg  q8H PRN and compazine 10mg  PO q6H for nausea -- EMLA cream for port -- Will add mirtazapine in order to help with appetite/sleep  #Nausea: --Symptoms improved with zofran and  olanzapine.  --Added IV zofran to infusion premeds for better symptom control.   #Hypokalemia:  --currently taking potassium chloride 10 mEq at bedtime --potassium level is 3.1 today. Continue PO supplementation.   #Lower extremity edema: --Currently takes lasix 20 mg as needed.  --Improving. Continue to monitor.   #Neuropathy involving fingers/feet: --Patient currently takes Cymbalta   No orders of the defined types were placed in this encounter.  All questions were answered. The patient knows to call the clinic with any problems, questions or concerns.  I have spent a total of 30 minutes minutes of face-to-face and non-face-to-face time, preparing to see the patient, performing a medically appropriate examination, counseling and educating the patient, ordering medications/tests, documenting clinical information in the electronic health record, and care coordination.   Ledell Peoples, MD Department of Hematology/Oncology [place] at [facility] Phone: [phone] Pager: [phone] Email: [email or web address]  10/31/2021 6:01 PM

## 2021-10-31 NOTE — Progress Notes (Signed)
Chronic Care Management Pharmacy Note  10/31/2021 Name:  [name] MRN:  [id number] DOB:  1955/07/05  Summary: Pharmd FU.  Copay on inhalers is burden.  [place]ved [place] and [place] through [place].  Can receive Breztri at no cost.  Reports daily use of Albuterol.  Severe metastatic lung cancer.  Recommendations/Changes made from today's visit: Rx for Breztri from [facility]  Plan: FU in 30 days to make sure she got med   Subjective: [name] is an 66 y.o. year old female who is a primary patient of Allwardt, Alyssa M, PA-C.  The CCM team was consulted for assistance with disease management and care coordination needs.    Engaged with patient by telephone for follow up visit in response to provider referral for pharmacy case management and/or care coordination services.   Consent to Services:  The patient was given the following information about Chronic Care Management services today, agreed to services, and gave verbal consent: 1. CCM service includes personalized support from designated clinical staff supervised by the primary care provider, including individualized plan of care and coordination with other care providers 2. 24/7 contact phone numbers for assistance for urgent and routine care needs. 3. Service will only be billed when office clinical staff spend 20 minutes or more in a month to coordinate care. 4. Only one practitioner may furnish and bill the service in a calendar month. 5.The patient may stop CCM services at any time (effective at the end of the month) by phone call to the office staff. 6. The patient will be responsible for cost sharing (co-pay) of up to 20% of the service fee (after annual deductible is met). Patient agreed to services and consent obtained.  Patient Care Team: Allwardt, Nicholes Rough as PCP - General (Physician Assistant) Milus Banister, MD as Attending Physician (Gastroenterology) Dr. Darylene Price, MD as Consulting Physician  (Orthopedic Surgery) Latanya Maudlin, MD as Consulting Physician (Orthopedic Surgery) Kathrynn Ducking, MD (Inactive) as Consulting Physician (Neurology) Okey Regal, [place] as Consulting Physician (Optometry) Delice Lesch Lezlie Octave, MD as Consulting Physician (Neurology) Edythe Clarity, [facility] (Pharmacist)  Recent office visits:  04/13/2021 OV (PCP) Allwardt, Randa Evens, PA-C; -Also talked about Myrbetriq.  We can try to get this covered with her insurance and see if this can help some of her symptoms as well, also to improve quality of life.   02/13/2021 VV Lucretia Kern, DO; molnupiravir 200 mg bid x 5 days.   01/05/2021 OV (PCP) Allwardt, Alyssa M, PA-C; no medication changes indicated.   Recent consult visits:  04/10/2021 OV (Pulmonology) Margaretha Seeds, MD;  --Will provide Trelegy samples. --START Advair 250-50 mcg ONE puff TWICE a day   04/05/2021 OV (Oncology) Orson Slick, MD; --today is Cycle 7 Day 1 of Atezolizumab maintenance.   03/14/2021 OV (Oncology) Darrel Reach, PA-C; --today is Cycle 6 Day 1 of Atezolizumab maintenance.   03/05/2021 OV (Physical Medicine) Courtney Heys, MD;  will increase Norco to 5 tabs/day- has Rx for 4x/day just sent this AM- take 5x/day and call me when current Rx will run out and will refill at 5x/day.  Add Wellbutrin 150 mg BID- take in AM and dinner time-  waking up early in AM due to depression   02/22/2021 OV (Oncology) Orson Slick, MD; --today is Cycle 5 Day 1 of Atezolizumab maintenance   01/23/2021 OV (Oncology) Orson Slick, MD; --today is Cycle 4 Day 1 of  Carbo/Etop/Atezolizumab   01/02/2021 OV (Oncology) Orson Slick, MD; --today is Cycle 3 Day 1 of Carbo/Etop/Atezolizumab   12/04/2020 OV (Oncology) Orson Slick, MD; --today is Cycle 2 Day 1 of Carbo/Etop/Atezolizumab   11/13/2020 OV (Oncology) Orson Slick, MD; --today is Cycle 1 Day 1 of Carbo/Etop/Atezolizumab, --GCSF shot on Day 3   11/10/2020 OV  (Pulmonology) Margaretha Seeds, MD; --INCREASE Trelegy 200-62.5-25 mcg ONE puff ONCE a day --[facility] visits:  12/19/2020 ED to Hospital Admission due to sepsis at [facility] date: 12/19/2020 Discharge date: 12/22/2020 # Sepsis present on admission secondary to E. coli bacteremia suspected E. coli UTI: -10 more days of Keflex by mouth - Suspected atypical pneumonia, however less likely.  Discontinue azithromycin. -  Reduced dose of thyroxine to 125 mcg daily from 150 mcg daily.  We will need recheck in 4 weeks. -  Will prescribe 2 weeks of magnesium oxide 400 mg daily   Objective:  Lab Results  Component Value Date   CREATININE 1.15 (H) 10/10/2021   BUN 10 10/10/2021   GFR 61.04 06/28/2020   EGFR 70 09/13/2020   GFRNONAA 53 (L) 10/10/2021   GFRAA >60 07/07/2019   NA 135 10/10/2021   K 3.6 10/10/2021   CALCIUM 8.4 (L) 10/10/2021   CO2 29 10/10/2021   GLUCOSE 103 (H) 10/10/2021    Lab Results  Component Value Date/Time   HGBA1C 6.5 01/05/2021 01:13 PM   HGBA1C 6.7 (H) 09/04/2020 03:35 PM   GFR 61.04 06/28/2020 04:19 PM   GFR 61.84 05/25/2020 12:30 PM   MICROALBUR 0.4 03/12/2019 01:54 PM    Last diabetic Eye exam: No results found for: "HMDIABEYEEXA"  Last diabetic Foot exam: No results found for: "HMDIABFOOTEX"   Lab Results  Component Value Date   CHOL 98 11/30/2019   HDL 31.90 (L) 11/30/2019   LDLCALC 27 11/30/2019   LDLDIRECT 72.0 08/31/2018   TRIG 193.0 (H) 11/30/2019   CHOLHDL 3 11/30/2019       Latest Ref Rng & Units 10/10/2021   10:58 AM 09/19/2021    9:53 AM 08/29/2021   12:11 PM  Hepatic Function  Total Protein 6.5 - 8.1 g/dL 6.2  6.5  6.5   Albumin 3.5 - 5.0 g/dL 3.6  3.7  3.6   AST 15 - 41 U/L 15  13  14    ALT 0 - 44 U/L 15  9  9    Alk Phosphatase 38 - 126 U/L 88  87  92   Total Bilirubin 0.3 - 1.2 mg/dL 0.5  0.4  0.4     Lab Results  Component Value Date/Time   TSH 0.709 10/10/2021 10:58 AM   TSH  1.966 09/19/2021 09:53 AM   TSH 0.43 09/04/2020 03:35 PM   TSH 4.39 11/04/2019 01:42 PM   FREET4 0.81 02/19/2019 03:51 PM       Latest Ref Rng & Units 10/10/2021   10:58 AM 09/19/2021    9:53 AM 08/29/2021   12:11 PM  CBC  WBC 4.0 - 10.5 K/uL 11.4  9.0  8.8   Hemoglobin 12.0 - 15.0 g/dL 12.2  13.2  12.4   Hematocrit 36.0 - 46.0 % 38.4  40.2  38.8   Platelets 150 - 400 K/uL 153  219  186     No results found for: "VD25OH"  Clinical ASCVD: No  The ASCVD Risk score (Arnett DK, et al., 2019) failed to calculate for  the following reasons:   The valid total cholesterol range is 130 to 320 mg/dL       10/22/2021    3:09 PM 06/22/2021    3:14 PM 05/03/2021    1:35 PM  Depression screen PHQ 2/9  Decreased Interest 1 1 0  Down, Depressed, Hopeless 1 1 1   PHQ - 2 Score 2 2 1   Altered sleeping   0  Tired, decreased energy   2  Change in appetite   0  Feeling bad or failure about yourself    0  Trouble concentrating   1  Moving slowly or fidgety/restless   0  Suicidal thoughts   0  PHQ-9 Score   4  Difficult doing work/chores   Somewhat difficult      Social History   Tobacco Use  Smoking Status Every Day   Packs/day: 2.00   Years: 46.00   Total pack years: 92.00   Types: Cigarettes  Smokeless Tobacco Never  Tobacco Comments   2 packs of cigarettes smoked daily ARJ 04/10/21   BP Readings from Last 3 Encounters:  10/22/21 112/70  10/10/21 (!) 144/71  09/19/21 122/71   Pulse Readings from Last 3 Encounters:  10/22/21 82  10/10/21 93  09/19/21 87   Wt Readings from Last 3 Encounters:  10/22/21 207 lb (93.9 kg)  10/10/21 200 lb 4.8 oz (90.9 kg)  09/19/21 198 lb 4.8 oz (89.9 kg)   BMI Readings from Last 3 Encounters:  10/22/21 35.53 kg/m  10/10/21 34.38 kg/m  09/19/21 34.04 kg/m    Assessment/Interventions: Review of patient past medical history, allergies, medications, health status, including review of consultants reports, laboratory and other test data, was  performed as part of comprehensive evaluation and provision of chronic care management services.   SDOH:  (Social Determinants of Health) assessments and interventions performed: Yes SDOH Interventions    Flowsheet Row Clinical Support from 05/03/2021 in [facility] from 12/27/2019 in [facility] Office Visit from 11/30/2019 in [facility] Video Visit from 10/29/2019 in [facility] Management from 10/01/2019 in [facility] Office Visit from 07/27/2019 in [facility]  SDOH Interventions        Food Insecurity Interventions -- -- -- -- Other (Comment) --  Depression Interventions/Treatment  Currently on Treatment Medication, Currently on Treatment Currently on Treatment Medication, Counseling -- Medication, Counseling, Referral to Psychiatry, Currently on Treatment      Financial Resource Strain: Medium Risk (05/03/2021)   Overall Financial Resource Strain (CARDIA)    Difficulty of Paying Living Expenses: Somewhat hard   Food Insecurity: No Food Insecurity (05/03/2021)   Hunger Vital Sign    Worried About Running Out of Food in the Last Year: Never true    [place] in the Last Year: Never true    [place]: No Food Insecurity (05/03/2021)  Housing: Low Risk  (05/03/2021)  Transportation Needs: No Transportation Needs (12/27/2019)  Alcohol Screen: Low Risk  (12/27/2019)  Depression (PHQ2-9): Low Risk  (10/22/2021)  Financial Resource Strain: Medium Risk (05/03/2021)  Physical Activity: Inactive (05/03/2021)  Social Connections: Moderately Integrated (05/03/2021)  Stress: Stress Concern Present (05/03/2021)  Tobacco Use: High Risk (10/22/2021)    CCM Care Plan  Allergies  Allergen Reactions   Metformin And Related Diarrhea    Nsaids Diarrhea   Wellbutrin [Bupropion] Other (See Comments)  Makes her too sleepy    Aleve [Naproxen Sodium] Other (See Comments)    Headache    Codeine Nausea Only and Other (See Comments)    GI upset   Penicillins Nausea Only and Other (See Comments)    GI upset    Sulfonamide Derivatives Hives    Medications Reviewed Today     Reviewed by Courtney Heys, MD (Physician) on 10/22/21 at 1549  Med List Status: <None>   Medication Order Taking? Sig Documenting Provider Last Dose Status Informant  albuterol (PROAIR HFA) 108 (90 Base) MCG/ACT inhaler [id number] Yes 2 puffs every 4 hours as needed only  if your can't catch your breath Margaretha Seeds, MD Taking Active   ALPRAZolam Duanne Moron) 1 MG tablet [id number] Yes TAKE 1 TABLET BY MOUTH THREE TIMES DAILY AS NEEDED FOR ANXIETY Allwardt, Alyssa M, PA-C Taking Active   atorvastatin (LIPITOR) 20 MG tablet [id number] Yes TAKE 1 TABLET BY MOUTH AT BEDTIME Allwardt, Randa Evens, PA-C Taking Active   benzonatate (TESSALON) 100 MG capsule [id number] Yes TAKE 1 CAPSULE BY MOUTH THREE TIMES DAILY Orson Slick, MD Taking Active   Budeson-Glycopyrrol-Formoterol (BREZTRI AEROSPHERE) 160-9-4.8 MCG/ACT AERO [id number] Yes Inhale 2 puffs into the lungs in the morning and at bedtime. Laurin Coder, MD Taking Active            Med Note Kathreen Devoid, HELEN   Wed Jun 27, 2021  1:11 PM) Will finish trelegy in 17 days  cyclobenzaprine (FLEXERIL) 10 MG tablet [id number] Yes Take 1 tablet (10 mg total) by mouth 3 (three) times daily as needed for muscle spasms. Lovorn, Jinny Blossom, MD Taking Active Self  DULoxetine (CYMBALTA) 60 MG capsule [id number] Yes TAKE TWO CAPSULES BY MOUTH DAILY Lovorn, Megan, MD Taking Active   fluconazole (DIFLUCAN) 100 MG tablet [id number] Yes TAKE 2 TABLETS BY MOUTH ON DAY 1, THEN TAKE 1 TABLET EVERY DAY [provider] Taking Active   fluticasone (FLONASE) 50 MCG/ACT nasal spray [id number] Yes INSTILL 2 SPRAYS IN EACH NOSTRIL EVERY  DAY Allwardt, Alyssa M, PA-C Taking Active   fluticasone-salmeterol (ADVAIR DISKUS) 250-50 MCG/ACT AEPB [id number] Yes Inhale 1 puff into the lungs in the morning and at bedtime. Margaretha Seeds, MD Taking Active   furosemide (LASIX) 20 MG tablet [id number] Yes TAKE 1 TABLET BY MOUTH TWICE DAILY FOR 7 DAYS THEN TAKE 1 TABLET ONCE DAILY Allwardt, Alyssa M, PA-C Taking Active   HYDROcodone-acetaminophen (NORCO) 10-325 MG tablet [id number] Yes TAKE 1 TABLET BY MOUTH EVERY 4 HOURS AS NEEDED max FIVE tablets DAILY Lovorn, Megan, MD  Active   levothyroxine (SYNTHROID) 137 MCG tablet [id number] Yes Take 1 tablet (137 mcg total) by mouth daily before breakfast. Lovorn, Megan, MD  Active   loratadine (CLARITIN) 10 MG tablet [id number] Yes Take 10 mg by mouth daily. [provider] Taking Active Self  meloxicam (MOBIC) 15 MG tablet [id number] Yes Take 1 tablet (15 mg total) by mouth daily. Allwardt, Randa Evens, PA-C Taking Active   methadone (DOLOPHINE) 5 MG tablet [id number] Yes Take 1 tablet (5 mg total) by mouth every 12 (twelve) hours. Lovorn, Jinny Blossom, MD  Active   OLANZapine (ZYPREXA) 10 MG tablet [id number] Yes Take 1 tablet (10 mg total) by mouth at bedtime. Orson Slick, MD Taking Active   omeprazole (PRILOSEC) 40 MG capsule [id number] Yes TAKE ONE CAPSULE BY MOUTH TWICE DAILY Noralyn Pick, NP Taking Active   ondansetron (ZOFRAN) 8 MG tablet [id number] Yes TAKE 2 TABLETS BY MOUTH TWICE  DAILY AS NEEDED Orson Slick, MD Taking Active   OXYGEN [id number] No Inhale 3 L into the lungs at bedtime. Uses At Night  Patient not taking: Reported on 10/22/2021   [provider] Not Taking Active Self           Med Note Berle Mull Nov 13, 2020 10:26 AM) Uses 3l/min Arnett @ night  potassium chloride (KLOR-CON M) 10 MEQ tablet [id number] Yes TAKE 1 TABLET BY MOUTH AT BEDTIME Allwardt, Alyssa M, PA-C Taking Active   prochlorperazine (COMPAZINE) 10 MG tablet [id number] Yes Take 1  tablet (10 mg total) by mouth every 6 (six) hours as needed for nausea or vomiting. Orson Slick, MD Taking Active Self  triamterene-hydrochlorothiazide (MAXZIDE-25) 37.5-25 MG tablet [id number] Yes TAKE 1 TABLET BY MOUTH DAILY - EMERGENCY REFILL FAXED DR. Allwardt, Randa Evens, PA-C Taking Active             Patient Active Problem List   Diagnosis Date Noted   Nausea without vomiting 03/14/2021   Abnormality of gait 03/05/2021   Type 2 diabetes mellitus with diabetic neuropathy, without long-term current use of insulin ([place]) 01/08/2021   Port-A-Cath in place 01/02/2021   E. coli sepsis ([place]) 12/19/2020   Nocturnal hypoxemia 11/27/2020   Small cell lung cancer ([place]) 10/27/2020   Pulmonary nodule 08/02/2020   Tobacco abuse 07/13/2020   Arthritis of carpometacarpal (CMC) joint of both thumbs 05/29/2020   Diabetes mellitus, new onset ([place]) 04/12/2019   Tremor 11/05/2018   Chronic respiratory failure with hypoxia ([place]) 01/12/2018   GERD with esophagitis 11/26/2017   Dysphagia 08/20/2017   Chronic pain syndrome 08/04/2017   Comorbid sleep-related hypoventilation 08/04/2017   OSA (obstructive sleep apnea) 08/04/2017   Hepatic steatosis 08/04/2017   Polyp of ascending colon    Diverticulosis of colon without hemorrhage    Gastritis and gastroduodenitis    Hypothyroidism 07/22/2017   Anxiety and depression 07/22/2017   Iron deficiency anemia 07/22/2017   Pilar cyst 07/22/2017   Morbid obesity due to excess calories ([place]) 03/19/2017   Anemia 03/19/2017   Chronic obstructive pulmonary disease ([place]) 03/18/2017   Degenerative lumbar spinal stenosis 02/17/2017   S/P right hip revision 09/02/2016   S/P hip replacement 09/02/2016   Instability of prosthetic hip ([place]) 03/01/2016   Dislocation of hip prosthesis ([place]) 02/17/2016   Abnormal nuclear stress test: Intermediate Risk 10/31/2015   Atypical angina 10/31/2015   Trigger finger, acquired 08/20/2012   Cigarette smoker 12/12/2008    Hyperlipidemia 11/17/2008   Anxiety state 11/16/2008   HYPERTENSION, BENIGN 11/16/2008   Primary osteoarthritis involving multiple joints 11/16/2008    Immunization History  Administered Date(s) Administered   Influenza,inj,Quad PF,6+ Mos 10/02/2018, 11/30/2019, 11/15/2020   Influenza-Unspecified 01/16/2011, 11/26/2011, 12/22/2012, 11/17/2018   PFIZER Comirnaty(Gray Top)Covid-19 Tri-Sucrose Vaccine 05/17/2020   PFIZER(Purple Top)SARS-COV-2 Vaccination 10/20/2019, 11/10/2019   PNEUMOCOCCAL CONJUGATE-20 09/04/2020   Td 06/07/2005    Conditions to be addressed/monitored:  HTN, Lung Cancer, DM, Hypothyroidism, COPD  There are no care plans that you recently modified to display for this patient.     Medication Assistance: Application for Breztri  medication assistance program. in process.  Anticipated assistance start date 10-14 days.  See plan of care for additional detail.  Compliance/Adherence/Medication fill history: Care Gaps: Due for foot exam and eye exam  [facility] Drugs: Atorvastatin 12m 02/02/21 90ds  Patient's [facility] pharmacy is:  [facility] [id number]  [place] [id number] Phone: [phone] Fax: [phone]  Uses pill box? Yes Pt endorses 100% compliance  We discussed: Benefits of medication synchronization, packaging and delivery as well as enhanced pharmacist oversight with [facility]. Patient decided to: Continue current medication management strategy  Care Plan and Follow Up Patient Decision:  Patient agrees to Care Plan and Follow-up.  Plan: The care management team will reach out to the patient again over the next 180 days.  Beverly Milch, PharmD Clinical Pharmacist  [facility] [phone]    Current Barriers:  Unable to independently afford treatment regimen  Pharmacist Clinical Goal(s):  Patient will verbalize ability to afford treatment regimen achieve adherence to  monitoring guidelines and medication adherence to achieve therapeutic efficacy through collaboration with PharmD and provider.   Interventions: 1:1 collaboration with Allwardt, Randa Evens, PA-C regarding development and update of comprehensive plan of care as evidenced by provider attestation and co-signature Inter-disciplinary care team collaboration (see longitudinal plan of care) Comprehensive medication review performed; medication list updated in electronic medical record   COPD (Goal: control symptoms and prevent exacerbations) -Not ideally controlled -Current treatment  Breztri 160-9-4.8 2 puffs twice daily Appropriate, Query effective, Albuterol HFA 9mg prn Appropriate, Effective, Safe, Accessible -Medications previously tried:  Trelegy ($) -MMRC/CAT score:     03/21/2020    4:24 PM  CAT Score  Total CAT Score 25     -Pulmonary function testing: Pulmonary Functions Testing Results:  TLC  Date Value Ref Range Status  05/26/2017 5.32 L Final    -Exacerbations requiring treatment in last 6 months: none -Patient reports consistent use of maintenance inhaler -Frequency of rescue inhaler use: daily -Counseled on Proper inhaler technique; Benefits of consistent maintenance inhaler use When to use rescue inhaler -Assessed patient finances. She is having difficulty affording Trelegy.  She was on samples and called in Rx for Advair because it would be cheaper.  I believe patient benefits from triple therapy.  During visit applied for [employer] [place] patient assistance program for [place]  Patient was approved and can receive for free.  Will consult with pulmonology to have rx sent in.  Should receive within 10-14 business days at no cost.  Lung Cancer (Goal: Reduce pain/symptoms) -Controlled -Current treatment  Atezolizumab Appropriate, Effective, Safe, Accessible -Patient experiencing nausea, has compazine and zofran which are helping per her reports. -Recommended to continue  current medication Followed closely by oncology.  Patient Goals/Self-Care Activities Patient will:  - take medications as prescribed as evidenced by patient report and record review collaborate with provider on medication access solutions  Follow Up Plan: The care management team will reach out to the patient again over the next 180 days.

## 2021-10-31 NOTE — Patient Instructions (Signed)
[facility]  Discharge Instructions: Thank you for choosing [facility] to provide your oncology and hematology care.   If you have a lab appointment with the [facility], please go directly to the [facility] and check in at the registration area.   Wear comfortable clothing and clothing appropriate for easy access to any Portacath or PICC line.   We strive to give you quality time with your provider. You may need to reschedule your appointment if you arrive late (15 or more minutes).  Arriving late affects you and other patients whose appointments are after yours.  Also, if you miss three or more appointments without notifying the office, you may be dismissed from the clinic at the provider's discretion.      For prescription refill requests, have your pharmacy contact our office and allow 72 hours for refills to be completed.    Today you received the following chemotherapy and/or immunotherapy agents: Tecentriq.       To help prevent nausea and vomiting after your treatment, we encourage you to take your nausea medication as directed.  BELOW ARE SYMPTOMS THAT SHOULD BE REPORTED IMMEDIATELY: *FEVER GREATER THAN 100.4 F (38 C) OR HIGHER *CHILLS OR SWEATING *NAUSEA AND VOMITING THAT IS NOT CONTROLLED WITH YOUR NAUSEA MEDICATION *UNUSUAL SHORTNESS OF BREATH *UNUSUAL BRUISING OR BLEEDING *URINARY PROBLEMS (pain or burning when urinating, or frequent urination) *BOWEL PROBLEMS (unusual diarrhea, constipation, pain near the anus) TENDERNESS IN MOUTH AND THROAT WITH OR WITHOUT PRESENCE OF ULCERS (sore throat, sores in mouth, or a toothache) UNUSUAL RASH, SWELLING OR PAIN  UNUSUAL VAGINAL DISCHARGE OR ITCHING   Items with * indicate a potential emergency and should be followed up as soon as possible or go to the Emergency Department if any problems should occur.  Please show the CHEMOTHERAPY ALERT CARD or IMMUNOTHERAPY ALERT CARD at check-in  to the Emergency Department and triage nurse.  Should you have questions after your visit or need to cancel or reschedule your appointment, please contact [facility]  Dept: [phone]  and follow the prompts.  Office hours are 8:00 a.m. to 4:30 p.m. Monday - Friday. Please note that voicemails left after 4:00 p.m. may not be returned until the following business day.  We are closed weekends and major holidays. You have access to a nurse at all times for urgent questions. Please call the main number to the clinic Dept: [phone] and follow the prompts.   For any non-urgent questions, you may also contact your provider using MyChart. We now offer e-Visits for anyone 23 and older to request care online for non-urgent symptoms. For details visit [email or web address].   Also download the MyChart app! Go to the app store, search "MyChart", open the app, select [facility], and log in with your MyChart username and password.  Masks are optional in the cancer centers. If you would like for your care team to wear a mask while they are taking care of you, please let them know. You may have one support person who is at least 65 years old accompany you for your appointments.

## 2021-10-31 NOTE — Progress Notes (Signed)
Nutrition Follow-up:  Patient with extensive stage SCLC. She is currently receiving maintenance therapy with atezolizumab q21d.  Met with patient in infusion. She reports appetite has been good. Patient says she has a taste for sweets recently. She is enjoying baking. Patient is getting outside more with cooler weather. She is appreciative of knitted blanket given to her today. Provided in hopes of finding some comfort during this difficult month ahead with upcoming anniversary of her daughters passing.   Medications: reviewed  Labs: Na 132, K 3.1, glucose 135, Ca 8.3  Anthropometrics: wt 205 lb today increased   9/13 - 200 lb 4.8 oz 8/28 - 198 lb 4.8 oz    NUTRITION DIAGNOSIS: Inadequate oral intake improving    INTERVENTION:  Encouraged protein source with meals and snacks Support and encouragement provided     MONITORING, EVALUATION, GOAL: weight trends, intake   NEXT VISIT: Wednesday October 18 during infusion

## 2021-11-02 LAB — T4: T4, Total: 9.5 ug/dL (ref 4.5–12.0)

## 2021-11-05 ENCOUNTER — Encounter: Payer: Self-pay | Admitting: Physical Medicine and Rehabilitation

## 2021-11-07 DIAGNOSIS — M48061 Spinal stenosis, lumbar region without neurogenic claudication: Secondary | ICD-10-CM | POA: Diagnosis not present

## 2021-11-07 DIAGNOSIS — M5416 Radiculopathy, lumbar region: Secondary | ICD-10-CM | POA: Diagnosis not present

## 2021-11-07 DIAGNOSIS — M5451 Vertebrogenic low back pain: Secondary | ICD-10-CM | POA: Diagnosis not present

## 2021-11-08 DIAGNOSIS — M5416 Radiculopathy, lumbar region: Secondary | ICD-10-CM | POA: Diagnosis not present

## 2021-11-09 ENCOUNTER — Other Ambulatory Visit: Payer: Self-pay | Admitting: Physician Assistant

## 2021-11-09 ENCOUNTER — Other Ambulatory Visit: Payer: Self-pay | Admitting: Hematology and Oncology

## 2021-11-11 ENCOUNTER — Other Ambulatory Visit: Payer: Self-pay | Admitting: Hematology and Oncology

## 2021-11-12 ENCOUNTER — Ambulatory Visit (INDEPENDENT_AMBULATORY_CARE_PROVIDER_SITE_OTHER): Payer: Medicare Other | Admitting: Pharmacist

## 2021-11-12 ENCOUNTER — Encounter: Payer: Self-pay | Admitting: Hematology and Oncology

## 2021-11-12 DIAGNOSIS — C3490 Malignant neoplasm of unspecified part of unspecified bronchus or lung: Secondary | ICD-10-CM

## 2021-11-12 DIAGNOSIS — J449 Chronic obstructive pulmonary disease, unspecified: Secondary | ICD-10-CM

## 2021-11-13 NOTE — Patient Instructions (Addendum)
Visit Information   Goals Addressed             This Visit's Progress    Track and Manage My Symptoms-COPD   On track    Timeframe:  Long-Range Goal Priority:  High Start Date:  05/07/21                           Expected End Date: 11/06/21                      Follow Up Date 08/06/21    Appropriate medication therapy at affordable copay.    Why is this important?   Tracking your symptoms and other information about your health helps your doctor plan your care.  Write down the symptoms, the time of day, what you were doing and what medicine you are taking.  You will soon learn how to manage your symptoms.     Notes:        Patient Care Plan: General Pharmacy (Adult)     Problem Identified: HTN, Lung Cancer, DM, Hypothyroidism, COPD   Priority: High  Onset Date: 05/07/2021     Long-Range Goal: Patient-Specific Goal   Start Date: 05/07/2021  Expected End Date: 11/06/2021  Recent Progress: On track  Priority: High  Note:   Current Barriers:  Unable to independently afford treatment regimen  Pharmacist Clinical Goal(s):  Patient will verbalize ability to afford treatment regimen achieve adherence to monitoring guidelines and medication adherence to achieve therapeutic efficacy through collaboration with PharmD and provider.   Interventions: 1:1 collaboration with [place], Randa Evens, PA-C regarding development and update of comprehensive plan of care as evidenced by provider attestation and co-signature Inter-disciplinary care team collaboration (see longitudinal plan of care) Comprehensive medication review performed; medication list updated in electronic medical record   COPD (Goal: control symptoms and prevent exacerbations) 11/12/21 -Controlled, now on Breztri as maintanance -Current treatment  Breztri 160-9-4.8 2 puffs twice daily Appropriate, Effective, Safe, Accessible Albuterol HFA 34mcg prn Appropriate, Effective, Safe, Accessible -Medications previously  tried:  Trelegy ($) -MMRC/CAT score:     03/21/2020    4:24 PM  CAT Score  Total CAT Score 25   -Pulmonary function testing: Pulmonary Functions Testing Results:  TLC  Date Value Ref Range Status  05/26/2017 5.32 L Final    -Exacerbations requiring treatment in last 6 months: none -Patient reports consistent use of maintenance inhaler -Frequency of rescue inhaler use: daily -Counseled on Proper inhaler technique; Benefits of consistent maintenance inhaler use When to use rescue inhaler -Has now been getting Breztri shipped to her since our last visit.  She has been using daily and reports improved breathing.  However, now she has gotten more assistance with meds and reports she may be able to afford Trelegy which she felt helped even better.  Plans to finish year on Breztri and then switch back as long as benefits do not change.  Will plan to follow up at years end to assist in coordination.  Lung Cancer (Goal: Reduce pain/symptoms) 11/12/21 -Progressive, patient has been handling treatment will -Current treatment  Atezolizumab Appropriate, Effective, Safe, Accessible -Patient experiencing nausea, has compazine and zofran which are helping per her reports. -Recommended to continue current medication Followed closely by oncology tolerating immunotherapy well.  Reports she is tired lately after a pain injection.  Probably a combination of her treatments and recovery.   No changes at this time, continue to routinely follow with  oncology.  Patient Goals/Self-Care Activities Patient will:  - take medications as prescribed as evidenced by patient report and record review collaborate with provider on medication access solutions  Follow Up Plan: The care management team will reach out to the patient again over the next 180 days.           The patient verbalized understanding of instructions, educational materials, and care plan provided today and DECLINED offer to receive copy of  patient instructions, educational materials, and care plan.  Telephone follow up appointment with pharmacy team member scheduled for: 6 months  Edythe Clarity, [place], PharmD Clinical Pharmacist  [facility] [phone]

## 2021-11-14 ENCOUNTER — Other Ambulatory Visit: Payer: Self-pay

## 2021-11-21 ENCOUNTER — Inpatient Hospital Stay (HOSPITAL_BASED_OUTPATIENT_CLINIC_OR_DEPARTMENT_OTHER): Payer: Medicare Other | Admitting: Hematology and Oncology

## 2021-11-21 ENCOUNTER — Inpatient Hospital Stay: Payer: Medicare Other

## 2021-11-21 VITALS — HR 93

## 2021-11-21 VITALS — BP 145/85 | HR 101 | Temp 97.7°F | Resp 17 | Wt 207.6 lb

## 2021-11-21 DIAGNOSIS — C3490 Malignant neoplasm of unspecified part of unspecified bronchus or lung: Secondary | ICD-10-CM

## 2021-11-21 DIAGNOSIS — R110 Nausea: Secondary | ICD-10-CM | POA: Diagnosis not present

## 2021-11-21 DIAGNOSIS — Z5986 Financial insecurity: Secondary | ICD-10-CM | POA: Diagnosis not present

## 2021-11-21 DIAGNOSIS — F17210 Nicotine dependence, cigarettes, uncomplicated: Secondary | ICD-10-CM | POA: Diagnosis not present

## 2021-11-21 DIAGNOSIS — I10 Essential (primary) hypertension: Secondary | ICD-10-CM | POA: Diagnosis not present

## 2021-11-21 DIAGNOSIS — M549 Dorsalgia, unspecified: Secondary | ICD-10-CM | POA: Diagnosis not present

## 2021-11-21 DIAGNOSIS — G629 Polyneuropathy, unspecified: Secondary | ICD-10-CM | POA: Diagnosis not present

## 2021-11-21 DIAGNOSIS — Z5112 Encounter for antineoplastic immunotherapy: Secondary | ICD-10-CM | POA: Diagnosis not present

## 2021-11-21 DIAGNOSIS — E119 Type 2 diabetes mellitus without complications: Secondary | ICD-10-CM | POA: Diagnosis not present

## 2021-11-21 DIAGNOSIS — Z8719 Personal history of other diseases of the digestive system: Secondary | ICD-10-CM | POA: Diagnosis not present

## 2021-11-21 DIAGNOSIS — E876 Hypokalemia: Secondary | ICD-10-CM | POA: Diagnosis not present

## 2021-11-21 DIAGNOSIS — M79605 Pain in left leg: Secondary | ICD-10-CM | POA: Diagnosis not present

## 2021-11-21 DIAGNOSIS — I700 Atherosclerosis of aorta: Secondary | ICD-10-CM | POA: Diagnosis not present

## 2021-11-21 DIAGNOSIS — Z95828 Presence of other vascular implants and grafts: Secondary | ICD-10-CM

## 2021-11-21 DIAGNOSIS — M79604 Pain in right leg: Secondary | ICD-10-CM | POA: Diagnosis not present

## 2021-11-21 DIAGNOSIS — C3431 Malignant neoplasm of lower lobe, right bronchus or lung: Secondary | ICD-10-CM | POA: Diagnosis not present

## 2021-11-21 DIAGNOSIS — R200 Anesthesia of skin: Secondary | ICD-10-CM | POA: Diagnosis not present

## 2021-11-21 DIAGNOSIS — R600 Localized edema: Secondary | ICD-10-CM | POA: Diagnosis not present

## 2021-11-21 DIAGNOSIS — J449 Chronic obstructive pulmonary disease, unspecified: Secondary | ICD-10-CM | POA: Diagnosis not present

## 2021-11-21 DIAGNOSIS — R609 Edema, unspecified: Secondary | ICD-10-CM

## 2021-11-21 DIAGNOSIS — Z79899 Other long term (current) drug therapy: Secondary | ICD-10-CM | POA: Diagnosis not present

## 2021-11-21 DIAGNOSIS — G8929 Other chronic pain: Secondary | ICD-10-CM | POA: Diagnosis not present

## 2021-11-21 DIAGNOSIS — Z880 Allergy status to penicillin: Secondary | ICD-10-CM | POA: Diagnosis not present

## 2021-11-21 DIAGNOSIS — R0600 Dyspnea, unspecified: Secondary | ICD-10-CM | POA: Diagnosis not present

## 2021-11-21 DIAGNOSIS — E785 Hyperlipidemia, unspecified: Secondary | ICD-10-CM | POA: Diagnosis not present

## 2021-11-21 DIAGNOSIS — M7989 Other specified soft tissue disorders: Secondary | ICD-10-CM | POA: Diagnosis not present

## 2021-11-21 LAB — CBC WITH DIFFERENTIAL (CANCER CENTER ONLY)
Abs Immature Granulocytes: 0.02 10*3/uL (ref 0.00–0.07)
Basophils Absolute: 0 10*3/uL (ref 0.0–0.1)
Basophils Relative: 0 %
Eosinophils Absolute: 0.2 10*3/uL (ref 0.0–0.5)
Eosinophils Relative: 3 %
HCT: 38.6 % (ref 36.0–46.0)
Hemoglobin: 12.2 g/dL (ref 12.0–15.0)
Immature Granulocytes: 0 %
Lymphocytes Relative: 30 %
Lymphs Abs: 2.4 10*3/uL (ref 0.7–4.0)
MCH: 27 pg (ref 26.0–34.0)
MCHC: 31.6 g/dL (ref 30.0–36.0)
MCV: 85.4 fL (ref 80.0–100.0)
Monocytes Absolute: 0.6 10*3/uL (ref 0.1–1.0)
Monocytes Relative: 7 %
Neutro Abs: 4.8 10*3/uL (ref 1.7–7.7)
Neutrophils Relative %: 60 %
Platelet Count: 185 10*3/uL (ref 150–400)
RBC: 4.52 MIL/uL (ref 3.87–5.11)
RDW: 19.4 % — ABNORMAL HIGH (ref 11.5–15.5)
WBC Count: 8.1 10*3/uL (ref 4.0–10.5)
nRBC: 0 % (ref 0.0–0.2)

## 2021-11-21 LAB — CMP (CANCER CENTER ONLY)
ALT: 11 U/L (ref 0–44)
AST: 13 U/L — ABNORMAL LOW (ref 15–41)
Albumin: 3.4 g/dL — ABNORMAL LOW (ref 3.5–5.0)
Alkaline Phosphatase: 95 U/L (ref 38–126)
Anion gap: 6 (ref 5–15)
BUN: 13 mg/dL (ref 8–23)
CO2: 34 mmol/L — ABNORMAL HIGH (ref 22–32)
Calcium: 8.6 mg/dL — ABNORMAL LOW (ref 8.9–10.3)
Chloride: 98 mmol/L (ref 98–111)
Creatinine: 1.12 mg/dL — ABNORMAL HIGH (ref 0.44–1.00)
GFR, Estimated: 55 mL/min — ABNORMAL LOW (ref >60)
Glucose, Bld: 96 mg/dL (ref 70–99)
Potassium: 3.1 mmol/L — ABNORMAL LOW (ref 3.5–5.1)
Sodium: 138 mmol/L (ref 135–145)
Total Bilirubin: 0.5 mg/dL (ref 0.3–1.2)
Total Protein: 6.2 g/dL — ABNORMAL LOW (ref 6.5–8.1)

## 2021-11-21 LAB — TSH: TSH: 1.115 u[IU]/mL (ref 0.350–4.500)

## 2021-11-21 MED ORDER — SODIUM CHLORIDE 0.9% FLUSH
10.0000 mL | INTRAVENOUS | Status: DC | PRN
  Administered 2021-11-21: 10 mL

## 2021-11-21 MED ORDER — SODIUM CHLORIDE 0.9 % IV SOLN
1200.0000 mg | Freq: Once | INTRAVENOUS | Status: AC
  Administered 2021-11-21: 1200 mg via INTRAVENOUS
  Filled 2021-11-21: qty 20

## 2021-11-21 MED ORDER — POTASSIUM CHLORIDE CRYS ER 10 MEQ PO TBCR: 20.0000 meq | EXTENDED_RELEASE_TABLET | Freq: Every day | ORAL | 0 refills | Status: DC

## 2021-11-21 MED ORDER — SODIUM CHLORIDE 0.9 % IV SOLN: Freq: Once | INTRAVENOUS | Status: AC

## 2021-11-21 MED ORDER — SODIUM CHLORIDE 0.9% FLUSH
10.0000 mL | Freq: Once | INTRAVENOUS | Status: AC
  Administered 2021-11-21: 10 mL

## 2021-11-21 MED ORDER — HEPARIN SOD (PORK) LOCK FLUSH 100 UNIT/ML IV SOLN
500.0000 [IU] | Freq: Once | INTRAVENOUS | Status: AC | PRN
  Administered 2021-11-21: 500 [IU]

## 2021-11-21 MED ORDER — ONDANSETRON HCL 4 MG/2ML IJ SOLN
8.0000 mg | Freq: Once | INTRAMUSCULAR | Status: AC
  Administered 2021-11-21: 8 mg via INTRAVENOUS
  Filled 2021-11-21: qty 4

## 2021-11-21 MED ORDER — FUROSEMIDE 20 MG PO TABS: ORAL_TABLET | ORAL | 0 refills | Status: DC

## 2021-11-21 NOTE — Patient Instructions (Signed)
[facility]  Discharge Instructions: Thank you for choosing [facility] to provide your oncology and hematology care.   If you have a lab appointment with the [facility], please go directly to the [facility] and check in at the registration area.   Wear comfortable clothing and clothing appropriate for easy access to any Portacath or PICC line.   We strive to give you quality time with your provider. You may need to reschedule your appointment if you arrive late (15 or more minutes).  Arriving late affects you and other patients whose appointments are after yours.  Also, if you miss three or more appointments without notifying the office, you may be dismissed from the clinic at the provider's discretion.      For prescription refill requests, have your pharmacy contact our office and allow 72 hours for refills to be completed.    Today you received the following chemotherapy and/or immunotherapy agents: Tecentriq       To help prevent nausea and vomiting after your treatment, we encourage you to take your nausea medication as directed.  BELOW ARE SYMPTOMS THAT SHOULD BE REPORTED IMMEDIATELY: *FEVER GREATER THAN 100.4 F (38 C) OR HIGHER *CHILLS OR SWEATING *NAUSEA AND VOMITING THAT IS NOT CONTROLLED WITH YOUR NAUSEA MEDICATION *UNUSUAL SHORTNESS OF BREATH *UNUSUAL BRUISING OR BLEEDING *URINARY PROBLEMS (pain or burning when urinating, or frequent urination) *BOWEL PROBLEMS (unusual diarrhea, constipation, pain near the anus) TENDERNESS IN MOUTH AND THROAT WITH OR WITHOUT PRESENCE OF ULCERS (sore throat, sores in mouth, or a toothache) UNUSUAL RASH, SWELLING OR PAIN  UNUSUAL VAGINAL DISCHARGE OR ITCHING   Items with * indicate a potential emergency and should be followed up as soon as possible or go to the Emergency Department if any problems should occur.  Please show the CHEMOTHERAPY ALERT CARD or IMMUNOTHERAPY ALERT CARD at check-in to  the Emergency Department and triage nurse.  Should you have questions after your visit or need to cancel or reschedule your appointment, please contact [facility]  Dept: [phone]  and follow the prompts.  Office hours are 8:00 a.m. to 4:30 p.m. Monday - Friday. Please note that voicemails left after 4:00 p.m. may not be returned until the following business day.  We are closed weekends and major holidays. You have access to a nurse at all times for urgent questions. Please call the main number to the clinic Dept: [phone] and follow the prompts.   For any non-urgent questions, you may also contact your provider using MyChart. We now offer e-Visits for anyone 42 and older to request care online for non-urgent symptoms. For details visit [email or web address].   Also download the MyChart app! Go to the app store, search "MyChart", open the app, select [facility], and log in with your MyChart username and password.  Masks are optional in the cancer centers. If you would like for your care team to wear a mask while they are taking care of you, please let them know. You may have one support person who is at least 65 years old accompany you for your appointments.

## 2021-11-21 NOTE — Progress Notes (Signed)
[place] [phone]) [phone]   [phone]  PROGRESS NOTE  Patient Care Team: Allwardt, Randa Evens, PA-C as PCP - General (Physician Assistant) Milus Banister, MD as Attending Physician (Gastroenterology) Dr. Darylene Price, MD as Consulting Physician (Orthopedic Surgery) Latanya Maudlin, MD as Consulting Physician (Orthopedic Surgery) Kathrynn Ducking, MD (Inactive) as Consulting Physician (Neurology) Okey Regal, Frederic as Consulting Physician (Optometry) Delice Lesch Lezlie Octave, MD as Consulting Physician (Neurology) Edythe Clarity, [facility] (Pharmacist)  Hematological/Oncological History # Small Cell Lung Cancer, Extensive Stage 07/05/2020: CT abdomen for lower abdominal pain. New pulmonary nodular density noted 07/06/2020: CT chest showed 2.2 cm macrolobulated right lower lobe pulmonary nodule (favored) versus pathologically enlarged infrahilar lymph node 10/13/2020: PET CT scan performed, findings show 2 cm right lower lobe lung mass is hypermetabolic and consistent with primary lung neoplasm. Additionally found hypermetabolic 17 mm soft tissue lesion between the descending duodenum and the pancreatic head  10/19/2020: EGD to biopsy hypermetabolic lymph node. Biopsy results show small cell lung cancer 10/26/2020: establish care with Dr. Lorenso Courier  11/13/2020: Cycle 1 Day 1 of Carbo/Etop/Atezolizumab 12/04/2020: Cycle 2 Day 1 of Carbo/Etop/Atezolizumab 11/22-11/25/2022: admitted for E. Coli bacteremia/sepsis. Start of Cycle 3 delayed. 01/02/2021: Cycle 3 Day 1 of Carbo/Etop/Atezolizumab 01/23/2021: Cycle 4 Day 1 of Carbo/Etop/Atezolizumab 02/22/2021: Cycle 5 Day 1 of Atezolizumab Maintenance. Delayed start due to patient's COVID infection.  03/14/2021: Cycle 6 Day 1 of Atezolizumab Maintenance 04/05/2021: Cycle 7 Day 1 of Atezolizumab Maintenance 04/25/2021: Cycle 8 Day 1 of Atezolizumab Maintenance 05/16/2021: Cycle 9 Day 1 of Atezolizumab Maintenance 06/06/2021:  Cycle 10 Day 1 of Atezolizumab Maintenance 06/27/2021:  Cycle 11 Day 1 of Atezolizumab Maintenance 07/18/2021: Cycle 12 Day 1 of Atezolizumab Maintenance 08/08/2021: Cycle 13 Day 1 of Atezolizumab Maintenance 08/29/2021: Cycle 14 Day 1 of Atezolizumab Maintenance 09/19/2021: Cycle 15 Day 1 of Atezolizumab Maintenance 10/10/2021: Cycle 16 Day 1 of Atezolizumab Maintenance 10/31/2021: Cycle 17 Day 1 of Atezolizumab Maintenance 11/21/2021: Cycle 18 Day 1 of Atezolizumab Maintenance  Interval History:  Langley Carmelia Bake 66 y.o. female with medical history significant for extensive stage small cell lung cancer who presents for a follow up visit. The patient's last visit was on 10/31/2021. In the interim since the last visit she has completed cycle 17 of chemotherapy. She presents today to start cycle 18 of chemotherapy.  On exam today Mrs. Hefner reports she is enjoying the fall weather.  She notes that she is taking her potassium 10 mEq nightly.  She has been having some issues with cramps and she does have swollen "puffy" feet.  She has that she is taking 2 Lasix pills in the morning at the same time.  She notes her legs can sometimes be painful bilaterally.  She reports that she also got some steroid shots in her back which have not been improving her current symptoms.  She reports that she is having some occasional queasiness "like morning sickness" every morning but no vomiting or diarrhea.  She is not having any issues with shortness of breath or chest pain.  She reports her energy is considerably better with each visit.  She denies fevers, chills, night sweats, chest pain or cough.She has no other complaints. A full 10 point ROS is listed below.  Overall she is willing and able to proceed with treatment today.   MEDICAL HISTORY:  Past Medical History:  Diagnosis Date   Allergic rhinitis    Anemia    Anxiety    Chicken pox  Chronic back pain    COPD (chronic obstructive pulmonary disease) (HCC)     Depression    DM (diabetes mellitus) ([place])    Essential hypertension    GERD (gastroesophageal reflux disease)    Headache    migraines   History of gastritis    EGD 2015   History of home oxygen therapy    2 liters at hs last 6 months   Hyperlipidemia    Hypothyroidism    Migraines    Osteoarthritis    oa   Scoliosis     SURGICAL HISTORY: Past Surgical History:  Procedure Laterality Date   APPENDECTOMY     1985   BIOPSY  07/24/2017   Procedure: BIOPSY;  Surgeon: Milus Banister, MD;  [facility]: [facility];  Service: Endoscopy;;   CARDIAC CATHETERIZATION N/A 10/31/2015   Procedure: Left Heart Cath and Coronary Angiography;  Surgeon: Leonie Man, MD;  Location: [place] [facility];  Service: Cardiovascular;  Laterality: N/A;   CARPAL TUNNEL RELEASE Left    CARPAL TUNNEL RELEASE Right    CHOLECYSTECTOMY  late 1980's   COLONOSCOPY WITH PROPOFOL N/A 07/24/2017   Procedure: COLONOSCOPY WITH PROPOFOL;  Surgeon: Milus Banister, MD;  [facility];  Service: Endoscopy;  Laterality: N/A;   ESOPHAGOGASTRODUODENOSCOPY N/A 07/24/2017   Procedure: ESOPHAGOGASTRODUODENOSCOPY (EGD);  Surgeon: Milus Banister, MD;  Location: [facility];  Service: Endoscopy;  Laterality: N/A;   ESOPHAGOGASTRODUODENOSCOPY (EGD) WITH PROPOFOL N/A 10/19/2020   Procedure: ESOPHAGOGASTRODUODENOSCOPY (EGD) WITH PROPOFOL;  Surgeon: Milus Banister, MD;  [facility];  Service: Endoscopy;  Laterality: N/A;   FINE NEEDLE ASPIRATION N/A 10/19/2020   Procedure: FINE NEEDLE ASPIRATION (FNA) LINEAR;  Surgeon: Milus Banister, MD;  [facility];  Service: Endoscopy;  Laterality: N/A;   GALLBLADDER SURGERY  1991   HIP CLOSED REDUCTION Right 01/08/2016   Procedure: CLOSED MANIPULATION HIP;  Surgeon: Susa Day, MD;  [facility];  Service: Orthopedics;  Laterality: Right;   HIP CLOSED REDUCTION Right 01/19/2016   Procedure: ATTEMPTED CLOSED REDUCTION RIGHT HIP;  Surgeon: Wylene Simmer, MD;  [facility];  Service: Orthopedics;  Laterality: Right;   HIP CLOSED REDUCTION Right 01/20/2016   Procedure: CLOSED REDUCTION RIGHT TOTAL HIP;  Surgeon: Paralee Cancel, MD;  [facility];  Service: Orthopedics;  Laterality: Right;   HIP CLOSED REDUCTION Right 02/17/2016   Procedure: CLOSED REDUCTION RIGHT TOTAL HIP;  Surgeon: Rod Can, MD;  Location: [place];  Service: Orthopedics;  Laterality: Right;   HIP CLOSED REDUCTION Right 02/28/2016   Procedure: CLOSED REDUCTION HIP;  Surgeon: Nicholes Stairs, MD;  [facility];  Service: Orthopedics;  Laterality: Right;   IR IMAGING GUIDED PORT INSERTION  11/01/2020   POLYPECTOMY  07/24/2017   Procedure: POLYPECTOMY;  Surgeon: Milus Banister, MD;  [facility];  Service: Endoscopy;;   Neligh, with 1 ovary removed and 2 nd ovary removed 2003   TOTAL HIP ARTHROPLASTY Right    Original surgery 2006 with revision 2010   TOTAL HIP REVISION Right 01/01/2016   Procedure: TOTAL HIP REVISION;  Surgeon: Paralee Cancel, MD;  [facility];  Service: Orthopedics;  Laterality: Right;   TOTAL HIP REVISION Right 03/02/2016   Procedure: TOTAL HIP REVISION;  Surgeon: Paralee Cancel, MD;  [facility];  Service: Orthopedics;  Laterality: Right;   TOTAL HIP REVISION Right 09/02/2016   Procedure: Right hip constrained  liner- posterior;  Surgeon: Paralee Cancel, MD;  [facility];  Service: Orthopedics;  Laterality: Right;   ULNAR NERVE TRANSPOSITION Right    UPPER ESOPHAGEAL ENDOSCOPIC ULTRASOUND (EUS) N/A 10/19/2020   Procedure: UPPER ESOPHAGEAL ENDOSCOPIC ULTRASOUND (EUS);  Surgeon: Milus Banister, MD;  Location: [facility];  Service: Endoscopy;  Laterality: N/A;  periduodenal lesion    SOCIAL HISTORY: Social History   Socioeconomic History   Marital status: Married    Spouse name: Not on file   Number of children: 2   Years of education: Not on file   Highest  education level: Not on file  Occupational History   Occupation: disabled   Occupation: disabled  Tobacco Use   Smoking status: Every Day    Packs/day: 2.00    Years: 46.00    Total pack years: 92.00    Types: Cigarettes   Smokeless tobacco: Never   Tobacco comments:    2 packs of cigarettes smoked daily ARJ 04/10/21  Vaping Use   Vaping Use: Never used  Substance and Sexual Activity   Alcohol use: No   Drug use: No   Sexual activity: Not Currently    Partners: Male  Other Topics Concern   Not on file  Social History Narrative   Right handed    Caffeine~ 2 cups per day    Lives at home with husband (strained relationship)   Primary caretaker for disabled brother who had aneurism   Daughter died 07/05/18    Social Determinants of Health   Financial Resource Strain: Medium Risk (05/03/2021)   Overall Financial Resource Strain (CARDIA)    Difficulty of Paying Living Expenses: Somewhat hard  Food Insecurity: No Food Insecurity (05/03/2021)   Hunger Vital Sign    Worried About Running Out of Food in the Last Year: Never true    Ran Out of Food in the Last Year: Never true  Transportation Needs: No Transportation Needs (12/27/2019)   PRAPARE - Hydrologist (Medical): No    Lack of Transportation (Non-Medical): No  Physical Activity: Inactive (05/03/2021)   Exercise Vital Sign    Days of Exercise per Week: 0 days    Minutes of Exercise per Session: 0 min  Stress: Stress Concern Present (05/03/2021)   [place]    Feeling of Stress : To some extent  Social Connections: Moderately Integrated (05/03/2021)   Social Connection and Isolation Panel [NHANES]    Frequency of Communication with Friends and Family: More than three times a week    Frequency of Social Gatherings with Friends and Family: Once a week    Attends Religious Services: 1 to 4 times per year    Active Member of [employer] or  Organizations: No    Attends Archivist Meetings: Never    Marital Status: Married  Human resources officer Violence: Not At Risk (05/03/2021)   Humiliation, Afraid, Rape, and Kick questionnaire    Fear of Current or Ex-Partner: No    Emotionally Abused: No    Physically Abused: No    Sexually Abused: No    FAMILY HISTORY: Family History  Problem Relation Age of Onset   COPD Mother    Heart disease Mother    Lung disease Father        Asbestosis   Heart attack Father    Heart disease Father    Cerebral aneurysm Brother    Aneurysm Brother  Brain   Drug abuse Daughter    Epilepsy Son    Alcohol abuse Son    Drug abuse Son    Arthritis Maternal Grandmother    Heart disease Maternal Grandmother    Asthma Maternal Grandfather    Cancer Maternal Grandfather    Arthritis Paternal Grandmother    Heart disease Paternal Grandmother    Stroke Paternal Grandmother    Early death Paternal Grandfather    Heart disease Paternal Grandfather     ALLERGIES:  is allergic to metformin and related, nsaids, wellbutrin [bupropion], aleve [naproxen sodium], codeine, penicillins, and sulfonamide derivatives.  MEDICATIONS:  Current Outpatient Medications  Medication Sig Dispense Refill   albuterol (PROAIR HFA) 108 (90 Base) MCG/ACT inhaler 2 puffs every 4 hours as needed only  if your can't catch your breath 18 g 3   ALPRAZolam (XANAX) 1 MG tablet TAKE 1 TABLET BY MOUTH THREE TIMES DAILY AS NEEDED FOR ANXIETY 90 tablet 2   atorvastatin (LIPITOR) 20 MG tablet TAKE 1 TABLET BY MOUTH AT BEDTIME 90 tablet 1   benzonatate (TESSALON) 100 MG capsule TAKE 1 CAPSULE BY MOUTH THREE TIMES DAILY 30 capsule 1   Budeson-Glycopyrrol-Formoterol (BREZTRI AEROSPHERE) 160-9-4.8 MCG/ACT AERO Inhale 2 puffs into the lungs in the morning and at bedtime. 10.7 g 3   cyclobenzaprine (FLEXERIL) 10 MG tablet Take 1 tablet (10 mg total) by mouth 3 (three) times daily as needed for muscle spasms. 90 tablet 5    DULoxetine (CYMBALTA) 60 MG capsule TAKE TWO CAPSULES BY MOUTH DAILY 60 capsule 5   fluconazole (DIFLUCAN) 100 MG tablet TAKE 2 TABLETS BY MOUTH ON DAY 1, THEN TAKE 1 TABLET EVERY DAY     fluticasone (FLONASE) 50 MCG/ACT nasal spray INSTILL 2 SPRAYS IN EACH NOSTRIL EVERY DAY 16 g 6   fluticasone-salmeterol (ADVAIR DISKUS) 250-50 MCG/ACT AEPB Inhale 1 puff into the lungs in the morning and at bedtime. 60 each 5   furosemide (LASIX) 20 MG tablet TAKE 1 TABLET BY MOUTH TWICE DAILY FOR 7 DAYS THEN TAKE 1 TABLET ONCE DAILY 37 tablet 0   HYDROcodone-acetaminophen (NORCO) 10-325 MG tablet TAKE 1 TABLET BY MOUTH EVERY 4 HOURS AS NEEDED max FIVE tablets DAILY 150 tablet 0   levothyroxine (SYNTHROID) 137 MCG tablet Take 1 tablet (137 mcg total) by mouth daily before breakfast. 30 tablet 5   loratadine (CLARITIN) 10 MG tablet Take 10 mg by mouth daily.     meloxicam (MOBIC) 15 MG tablet TAKE 1 TABLET BY MOUTH EVERY DAY 30 tablet 2   methadone (DOLOPHINE) 5 MG tablet Take 1 tablet (5 mg total) by mouth every 12 (twelve) hours. 60 tablet 0   OLANZapine (ZYPREXA) 10 MG tablet Take 1 tablet (10 mg total) by mouth at bedtime. 30 tablet 2   omeprazole (PRILOSEC) 40 MG capsule TAKE ONE CAPSULE BY MOUTH TWICE DAILY 60 capsule 2   ondansetron (ZOFRAN) 8 MG tablet TAKE 2 TABLETS BY MOUTH TWICE DAILY AS NEEDED 60 tablet 0   potassium chloride (KLOR-CON M) 10 MEQ tablet TAKE 1 TABLET BY MOUTH AT BEDTIME 90 tablet 1   predniSONE (DELTASONE) 10 MG tablet take 1 tab three times a day for 2 days, 1 tab twice a day for 5 days, 1 tab daily till finished     prochlorperazine (COMPAZINE) 10 MG tablet Take 1 tablet (10 mg total) by mouth every 6 (six) hours as needed for nausea or vomiting. 30 tablet 0   triamterene-hydrochlorothiazide (MAXZIDE-25) 37.5-25 MG tablet TAKE  1 TABLET BY MOUTH DAILY - EMERGENCY REFILL FAXED DR. 90 tablet 2   No current facility-administered medications for this visit.    REVIEW OF SYSTEMS:    Constitutional: ( - ) fevers, ( - )  chills , ( - ) night sweats Eyes: ( - ) blurriness of vision, ( - ) double vision, ( - ) watery eyes Ears, nose, mouth, throat, and face: ( - ) mucositis, ( - ) sore throat Respiratory: ( - ) cough, ( +) dyspnea, ( - ) wheezes Cardiovascular: ( - ) palpitation, ( - ) chest discomfort, ( + ) lower extremity swelling Gastrointestinal:  ( -) nausea, ( - ) heartburn, ( - ) change in bowel habits Skin: ( - ) abnormal skin rashes Lymphatics: ( - ) new lymphadenopathy, ( - ) easy bruising Neurological: (+ ) numbness, ( - ) tingling, ( - ) new weaknesses Behavioral/Psych: ( - ) mood change, ( - ) new changes  All other systems were reviewed with the patient and are negative.  PHYSICAL EXAMINATION: ECOG PERFORMANCE STATUS: 1 - Symptomatic but completely ambulatory  Vitals:   11/21/21 0817  BP: (!) 145/85  Pulse: (!) 101  Resp: 17  Temp: 97.7 F (36.5 C)  SpO2: 92%   Filed Weights   11/21/21 0817  Weight: 207 lb 9.6 oz (94.2 kg)    GENERAL: Well-appearing middle-age Caucasian female, alert, no distress and comfortable SKIN: skin color, texture, turgor are normal, no rashes or significant lesions EYES: conjunctiva are pink and non-injected, sclera clear LUNGS:  normal breathing effort. Diffuse wheezing hear on ausculation.  HEART: regular rate & rhythm and no murmurs. Mild bilateral lower extremity edema Musculoskeletal: no cyanosis of digits and no clubbing  PSYCH: alert & oriented x 3, fluent speech NEURO: no focal motor/sensory deficits  LABORATORY DATA:  I have reviewed the data as listed    Latest Ref Rng & Units 11/21/2021    7:54 AM 10/31/2021   10:03 AM 10/10/2021   10:58 AM  CBC  WBC 4.0 - 10.5 K/uL 8.1  7.9  11.4   Hemoglobin 12.0 - 15.0 g/dL 12.2  12.1  12.2   Hematocrit 36.0 - 46.0 % 38.6  37.5  38.4   Platelets 150 - 400 K/uL 185  179  153        Latest Ref Rng & Units 11/21/2021    7:54 AM 10/31/2021   10:03 AM 10/10/2021    10:58 AM  CMP  Glucose 70 - 99 mg/dL 96  135  103   BUN 8 - 23 mg/dL 13  9  10    Creatinine 0.44 - 1.00 mg/dL 1.12  0.95  1.15   Sodium 135 - 145 mmol/L 138  132  135   Potassium 3.5 - 5.1 mmol/L 3.1  3.1  3.6   Chloride 98 - 111 mmol/L 98  93  101   CO2 22 - 32 mmol/L 34  35  29   Calcium 8.9 - 10.3 mg/dL 8.6  8.3  8.4   Total Protein 6.5 - 8.1 g/dL 6.2  5.7  6.2   Total Bilirubin 0.3 - 1.2 mg/dL 0.5  0.5  0.5   Alkaline Phos 38 - 126 U/L 95  80  88   AST 15 - 41 U/L 13  23  15    ALT 0 - 44 U/L 11  18  15      No results found for: "MPROTEIN" Lab Results  Component Value Date  KPAFRELGTCHN 0.75 06/30/2014   LAMBDASER 3.78 (H) 06/30/2014   KAPLAMBRATIO 0.20 (L) 06/30/2014     RADIOGRAPHIC STUDIES: CT CHEST ABDOMEN PELVIS W CONTRAST  Result Date: 10/29/2021 CLINICAL DATA:  Small cell lung cancer, back pain and nausea. * Tracking Code: BO * EXAM: CT CHEST, ABDOMEN, AND PELVIS WITH CONTRAST TECHNIQUE: Multidetector CT imaging of the chest, abdomen and pelvis was performed following the standard protocol during bolus administration of intravenous contrast. RADIATION DOSE REDUCTION: This exam was performed according to the departmental dose-optimization program which includes automated exposure control, adjustment of the mA and/or kV according to patient size and/or use of iterative reconstruction technique. CONTRAST:  162mL OMNIPAQUE IOHEXOL 300 MG/ML  SOLN COMPARISON:  06/29/2021, PET 10/13/2020 and CT abdomen 07/05/2020. FINDINGS: CT CHEST FINDINGS Cardiovascular: Right IJ Port-A-Cath terminates in the right atrium. Atherosclerotic calcification of the aorta, aortic valve and coronary arteries. Heart size normal. No pericardial effusion. Mediastinum/Nodes: No pathologically enlarged mediastinal, hilar or axillary lymph nodes. Esophagus is grossly unremarkable. Lungs/Pleura: Centrilobular and paraseptal emphysema. Lungs are clear. No pleural fluid. Airway is unremarkable. Musculoskeletal:  Degenerative changes in the spine. No worrisome lytic or sclerotic lesions. CT ABDOMEN PELVIS FINDINGS Hepatobiliary: Liver is unremarkable. Cholecystectomy. No unexpected biliary ductal dilatation. Pancreas: Negative. Spleen: Negative. Adrenals/Urinary Tract: Right adrenal gland is unremarkable. Left adrenal nodule measures 1.7 cm and is unchanged from 09/12/2020, at which time it measured -4 Hounsfield units. No follow-up necessary. Vague low-attenuation lesion in the upper pole right kidney measures approximate 1.4 cm (2/57), unchanged from 07/05/2020. No specific follow-up necessary. Kidneys are otherwise unremarkable. Ureters are decompressed. There may be bladder wall thickening but the bladder is relatively obscured by streak artifact from a right hip arthroplasty. Stomach/Bowel: Stomach and small bowel are unremarkable. Appendix is not visualized. Stool is seen throughout the colon. Vascular/Lymphatic: Atherosclerotic calcification of the aorta. Gastrohepatic ligament and periportal lymph nodes are not enlarged by CT size criteria. No pathologically enlarged lymph nodes. Reproductive: Hysterectomy.  No adnexal mass. Other: No free fluid.  Mesenteries and peritoneum are unremarkable. Musculoskeletal: Right hip arthroplasty. Degenerative changes in the spine. Levoconvex curvature of the lumbar spine. No worrisome lytic or sclerotic lesions. IMPRESSION: 1. No evidence of recurrent or metastatic disease. 2. Left adrenal adenoma. 3. Stool throughout the colon is indicative of constipation. 4. Aortic atherosclerosis (ICD10-I70.0). Coronary artery calcification. 5.  Emphysema (ICD10-J43.9). Electronically Signed   By: Lorin Picket M.D.   On: 10/29/2021 16:23    ASSESSMENT & PLAN [name] 66 y.o. female with medical history significant for extensive stage small cell lung cancer who presents for a follow up visit.   After review of the labs, review of the records, and discussion with the patient the  patients findings are most consistent with extensive stage small cell lung cancer with metastasis from the right lower lobe to the lymph nodes of the abdomen.  At this time we will pursue triple therapy with carboplatin, etoposide, and atezolizumab.  After 4 cycles we will convert to maintenance atezolizumab alone.  We previously discussed the risks and benefits of this therapy and the patient was in agreement to proceed with this treatment.  The treatment of choice consist of carboplatin, etoposide, and atezolizumab.  The regimen consists of carboplatin AUC of 5 IV on day 1, etoposide 100 mg per metered squared IV on day 1, 2, and 3 and atezolizumab 1200 mg on day 1.  This continues for 21-day cycles.  After 4 cycles the patient proceeds with atezolizumab maintenance  therapy alone.   #Small Cell Lung Cancer, Extensive Stage -- MRI of the brain shows no evidence of intracranial spread --Findings are currently consistent with metastatic small cell lung cancer with metastatic spread to the lymph nodes of the abdomen --Prognosis is poor with anticipated life span of less than 12 months -- Patient has extensive stage due to metastatic spread to lymph nodes in the abdomen --Plan to proceed with carboplatin, etoposide, and atezolizumab --will consider prophylactic cranial radiation after start of chemotherapy.  Plan: --today is Cycle 18 Day 1 of Atezolizumab maintenance --labs today were reviewed and adequate for treatment. Hgb 12.2, white blood cell 8.1, platelets 195, MCV 85.4. --CT scan q 3 months. Last scan in Sept 2023 showed continued excellent response to therapy. CT scan next in Dec 2023.  --RTC in 3 weeks for next cycle of maintenance atezolizumab   #Supportive Care -- chemotherapy education complete -- port placed -- zofran 8mg  q8H PRN and compazine 10mg  PO q6H for nausea -- EMLA cream for port -- Will add mirtazapine in order to help with appetite/sleep  #Nausea: --Symptoms improved  with zofran and olanzapine.  --Added IV zofran to infusion premeds for better symptom control.   #Hypokalemia:  --currently taking potassium chloride 10 mEq at bedtime. Will increase to 20 mEq PO daily today.  --potassium level is still 3.1 today. Continue PO supplementation.   #Lower extremity edema-worsening --Currently takes lasix 20 mg daily, recommending increasing to q 6 hours x 2 doses daily.  --Improving. Continue to monitor.   #Neuropathy involving fingers/feet: --Patient currently takes Cymbalta   Orders Placed This Encounter  Procedures   TSH    Standing Status:   Future    Standing Expiration Date:   01/02/2023   T4    Standing Status:   Future    Standing Expiration Date:   01/02/2023   CBC with Differential ([facility] Only)    Standing Status:   Future    Standing Expiration Date:   01/03/2023   CMP ([facility] only)    Standing Status:   Future    Standing Expiration Date:   01/03/2023   TSH    Standing Status:   Future    Standing Expiration Date:   01/23/2023   T4    Standing Status:   Future    Standing Expiration Date:   01/23/2023   CBC with Differential ([facility] Only)    Standing Status:   Future    Standing Expiration Date:   01/24/2023   CMP ([facility] only)    Standing Status:   Future    Standing Expiration Date:   01/24/2023   All questions were answered. The patient knows to call the clinic with any problems, questions or concerns.  I have spent a total of 30 minutes minutes of face-to-face and non-face-to-face time, preparing to see the patient, performing a medically appropriate examination, counseling and educating the patient, ordering medications/tests, documenting clinical information in the electronic health record, and care coordination.   Ledell Peoples, MD Department of Hematology/Oncology [place] at [facility] Phone: [phone] Pager: [phone] Email:  [email or web address]  11/21/2021 8:49 AM

## 2021-11-22 DIAGNOSIS — M47896 Other spondylosis, lumbar region: Secondary | ICD-10-CM | POA: Diagnosis not present

## 2021-11-22 LAB — T4: T4, Total: 10.1 ug/dL (ref 4.5–12.0)

## 2021-11-27 ENCOUNTER — Other Ambulatory Visit: Payer: Self-pay | Admitting: Physician Assistant

## 2021-11-27 DIAGNOSIS — F17210 Nicotine dependence, cigarettes, uncomplicated: Secondary | ICD-10-CM

## 2021-11-27 DIAGNOSIS — C3490 Malignant neoplasm of unspecified part of unspecified bronchus or lung: Secondary | ICD-10-CM

## 2021-11-27 DIAGNOSIS — J449 Chronic obstructive pulmonary disease, unspecified: Secondary | ICD-10-CM | POA: Diagnosis not present

## 2021-11-27 NOTE — Telephone Encounter (Signed)
Last OV: 08/17/21  Next OV: 05/16/22  Last filled: 08/30/21  Quantity: 90 tab w/ 2 refills

## 2021-11-29 ENCOUNTER — Other Ambulatory Visit: Payer: Self-pay | Admitting: Physical Medicine and Rehabilitation

## 2021-11-29 DIAGNOSIS — G894 Chronic pain syndrome: Secondary | ICD-10-CM

## 2021-11-29 MED ORDER — HYDROCODONE-ACETAMINOPHEN 10-325 MG PO TABS: ORAL_TABLET | ORAL | 0 refills | Status: DC

## 2021-12-06 DIAGNOSIS — M47816 Spondylosis without myelopathy or radiculopathy, lumbar region: Secondary | ICD-10-CM | POA: Diagnosis not present

## 2021-12-07 ENCOUNTER — Other Ambulatory Visit: Payer: Self-pay | Admitting: Physician Assistant

## 2021-12-07 ENCOUNTER — Other Ambulatory Visit: Payer: Self-pay | Admitting: Physical Medicine and Rehabilitation

## 2021-12-07 DIAGNOSIS — J449 Chronic obstructive pulmonary disease, unspecified: Secondary | ICD-10-CM

## 2021-12-07 DIAGNOSIS — I10 Essential (primary) hypertension: Secondary | ICD-10-CM

## 2021-12-07 MED ORDER — METHADONE HCL 5 MG PO TABS: 5.0000 mg | ORAL_TABLET | Freq: Two times a day (BID) | ORAL | 0 refills | Status: DC

## 2021-12-08 ENCOUNTER — Other Ambulatory Visit: Payer: Self-pay | Admitting: Nurse Practitioner

## 2021-12-10 NOTE — Telephone Encounter (Signed)
Patient is requesting Omeprazole. Please advise.

## 2021-12-12 ENCOUNTER — Inpatient Hospital Stay: Payer: Medicare Other

## 2021-12-12 ENCOUNTER — Inpatient Hospital Stay: Payer: Medicare Other | Admitting: Dietician

## 2021-12-12 ENCOUNTER — Telehealth: Payer: Self-pay | Admitting: *Deleted

## 2021-12-12 ENCOUNTER — Inpatient Hospital Stay: Payer: Medicare Other | Attending: Hematology and Oncology | Admitting: Hematology and Oncology

## 2021-12-12 ENCOUNTER — Other Ambulatory Visit: Payer: Self-pay | Admitting: Physician Assistant

## 2021-12-12 ENCOUNTER — Other Ambulatory Visit: Payer: Self-pay

## 2021-12-12 VITALS — BP 137/82 | HR 96 | Temp 98.2°F | Resp 17 | Wt 202.3 lb

## 2021-12-12 DIAGNOSIS — Z79890 Hormone replacement therapy (postmenopausal): Secondary | ICD-10-CM | POA: Insufficient documentation

## 2021-12-12 DIAGNOSIS — E876 Hypokalemia: Secondary | ICD-10-CM | POA: Insufficient documentation

## 2021-12-12 DIAGNOSIS — G8929 Other chronic pain: Secondary | ICD-10-CM | POA: Diagnosis not present

## 2021-12-12 DIAGNOSIS — R200 Anesthesia of skin: Secondary | ICD-10-CM | POA: Diagnosis not present

## 2021-12-12 DIAGNOSIS — R600 Localized edema: Secondary | ICD-10-CM | POA: Insufficient documentation

## 2021-12-12 DIAGNOSIS — Z90721 Acquired absence of ovaries, unilateral: Secondary | ICD-10-CM | POA: Insufficient documentation

## 2021-12-12 DIAGNOSIS — Z880 Allergy status to penicillin: Secondary | ICD-10-CM | POA: Insufficient documentation

## 2021-12-12 DIAGNOSIS — Z79899 Other long term (current) drug therapy: Secondary | ICD-10-CM | POA: Insufficient documentation

## 2021-12-12 DIAGNOSIS — Z5112 Encounter for antineoplastic immunotherapy: Secondary | ICD-10-CM | POA: Diagnosis not present

## 2021-12-12 DIAGNOSIS — Z814 Family history of other substance abuse and dependence: Secondary | ICD-10-CM | POA: Insufficient documentation

## 2021-12-12 DIAGNOSIS — E785 Hyperlipidemia, unspecified: Secondary | ICD-10-CM | POA: Diagnosis not present

## 2021-12-12 DIAGNOSIS — Z823 Family history of stroke: Secondary | ICD-10-CM | POA: Insufficient documentation

## 2021-12-12 DIAGNOSIS — Z886 Allergy status to analgesic agent status: Secondary | ICD-10-CM | POA: Insufficient documentation

## 2021-12-12 DIAGNOSIS — G629 Polyneuropathy, unspecified: Secondary | ICD-10-CM | POA: Diagnosis not present

## 2021-12-12 DIAGNOSIS — M7989 Other specified soft tissue disorders: Secondary | ICD-10-CM | POA: Insufficient documentation

## 2021-12-12 DIAGNOSIS — Z809 Family history of malignant neoplasm, unspecified: Secondary | ICD-10-CM | POA: Insufficient documentation

## 2021-12-12 DIAGNOSIS — M549 Dorsalgia, unspecified: Secondary | ICD-10-CM | POA: Insufficient documentation

## 2021-12-12 DIAGNOSIS — Z882 Allergy status to sulfonamides status: Secondary | ICD-10-CM | POA: Diagnosis not present

## 2021-12-12 DIAGNOSIS — F32A Depression, unspecified: Secondary | ICD-10-CM | POA: Insufficient documentation

## 2021-12-12 DIAGNOSIS — Z885 Allergy status to narcotic agent status: Secondary | ICD-10-CM | POA: Diagnosis not present

## 2021-12-12 DIAGNOSIS — R110 Nausea: Secondary | ICD-10-CM | POA: Insufficient documentation

## 2021-12-12 DIAGNOSIS — J449 Chronic obstructive pulmonary disease, unspecified: Secondary | ICD-10-CM | POA: Diagnosis not present

## 2021-12-12 DIAGNOSIS — C3490 Malignant neoplasm of unspecified part of unspecified bronchus or lung: Secondary | ICD-10-CM

## 2021-12-12 DIAGNOSIS — C3431 Malignant neoplasm of lower lobe, right bronchus or lung: Secondary | ICD-10-CM | POA: Insufficient documentation

## 2021-12-12 DIAGNOSIS — Z5986 Financial insecurity: Secondary | ICD-10-CM | POA: Insufficient documentation

## 2021-12-12 DIAGNOSIS — Z8719 Personal history of other diseases of the digestive system: Secondary | ICD-10-CM | POA: Insufficient documentation

## 2021-12-12 DIAGNOSIS — Z95828 Presence of other vascular implants and grafts: Secondary | ICD-10-CM

## 2021-12-12 DIAGNOSIS — Z811 Family history of alcohol abuse and dependence: Secondary | ICD-10-CM | POA: Insufficient documentation

## 2021-12-12 DIAGNOSIS — Z9049 Acquired absence of other specified parts of digestive tract: Secondary | ICD-10-CM | POA: Insufficient documentation

## 2021-12-12 DIAGNOSIS — F419 Anxiety disorder, unspecified: Secondary | ICD-10-CM | POA: Diagnosis not present

## 2021-12-12 DIAGNOSIS — F17210 Nicotine dependence, cigarettes, uncomplicated: Secondary | ICD-10-CM | POA: Insufficient documentation

## 2021-12-12 DIAGNOSIS — Z825 Family history of asthma and other chronic lower respiratory diseases: Secondary | ICD-10-CM | POA: Insufficient documentation

## 2021-12-12 DIAGNOSIS — R0600 Dyspnea, unspecified: Secondary | ICD-10-CM | POA: Diagnosis not present

## 2021-12-12 DIAGNOSIS — I10 Essential (primary) hypertension: Secondary | ICD-10-CM | POA: Insufficient documentation

## 2021-12-12 DIAGNOSIS — Z888 Allergy status to other drugs, medicaments and biological substances status: Secondary | ICD-10-CM | POA: Insufficient documentation

## 2021-12-12 DIAGNOSIS — Z820 Family history of epilepsy and other diseases of the nervous system: Secondary | ICD-10-CM | POA: Insufficient documentation

## 2021-12-12 DIAGNOSIS — Z8249 Family history of ischemic heart disease and other diseases of the circulatory system: Secondary | ICD-10-CM | POA: Insufficient documentation

## 2021-12-12 LAB — CBC WITH DIFFERENTIAL (CANCER CENTER ONLY)
Abs Immature Granulocytes: 0.07 10*3/uL (ref 0.00–0.07)
Basophils Absolute: 0 10*3/uL (ref 0.0–0.1)
Basophils Relative: 0 %
Eosinophils Absolute: 0.6 10*3/uL — ABNORMAL HIGH (ref 0.0–0.5)
Eosinophils Relative: 5 %
HCT: 38.2 % (ref 36.0–46.0)
Hemoglobin: 12.1 g/dL (ref 12.0–15.0)
Immature Granulocytes: 1 %
Lymphocytes Relative: 31 %
Lymphs Abs: 3.6 10*3/uL (ref 0.7–4.0)
MCH: 27.3 pg (ref 26.0–34.0)
MCHC: 31.7 g/dL (ref 30.0–36.0)
MCV: 86 fL (ref 80.0–100.0)
Monocytes Absolute: 0.8 10*3/uL (ref 0.1–1.0)
Monocytes Relative: 7 %
Neutro Abs: 6.3 10*3/uL (ref 1.7–7.7)
Neutrophils Relative %: 56 %
Platelet Count: 232 10*3/uL (ref 150–400)
RBC: 4.44 MIL/uL (ref 3.87–5.11)
RDW: 21 % — ABNORMAL HIGH (ref 11.5–15.5)
WBC Count: 11.4 10*3/uL — ABNORMAL HIGH (ref 4.0–10.5)
nRBC: 0 % (ref 0.0–0.2)

## 2021-12-12 LAB — CMP (CANCER CENTER ONLY)
ALT: 6 U/L (ref 0–44)
AST: 12 U/L — ABNORMAL LOW (ref 15–41)
Albumin: 3.6 g/dL (ref 3.5–5.0)
Alkaline Phosphatase: 98 U/L (ref 38–126)
Anion gap: 7 (ref 5–15)
BUN: 17 mg/dL (ref 8–23)
CO2: 32 mmol/L (ref 22–32)
Calcium: 9 mg/dL (ref 8.9–10.3)
Chloride: 97 mmol/L — ABNORMAL LOW (ref 98–111)
Creatinine: 1.18 mg/dL — ABNORMAL HIGH (ref 0.44–1.00)
GFR, Estimated: 51 mL/min — ABNORMAL LOW (ref >60)
Glucose, Bld: 97 mg/dL (ref 70–99)
Potassium: 3.2 mmol/L — ABNORMAL LOW (ref 3.5–5.1)
Sodium: 136 mmol/L (ref 135–145)
Total Bilirubin: 0.6 mg/dL (ref 0.3–1.2)
Total Protein: 6.7 g/dL (ref 6.5–8.1)

## 2021-12-12 LAB — TSH: TSH: 2.45 u[IU]/mL (ref 0.350–4.500)

## 2021-12-12 MED ORDER — SODIUM CHLORIDE 0.9% FLUSH
10.0000 mL | INTRAVENOUS | Status: DC | PRN
  Administered 2021-12-12: 10 mL

## 2021-12-12 MED ORDER — ONDANSETRON HCL 4 MG/2ML IJ SOLN
8.0000 mg | Freq: Once | INTRAMUSCULAR | Status: AC
  Administered 2021-12-12: 8 mg via INTRAVENOUS
  Filled 2021-12-12: qty 4

## 2021-12-12 MED ORDER — HEPARIN SOD (PORK) LOCK FLUSH 100 UNIT/ML IV SOLN
500.0000 [IU] | Freq: Once | INTRAVENOUS | Status: AC | PRN
  Administered 2021-12-12: 500 [IU]

## 2021-12-12 MED ORDER — SODIUM CHLORIDE 0.9 % IV SOLN
1200.0000 mg | Freq: Once | INTRAVENOUS | Status: AC
  Administered 2021-12-12: 1200 mg via INTRAVENOUS
  Filled 2021-12-12: qty 20

## 2021-12-12 MED ORDER — SODIUM CHLORIDE 0.9 % IV SOLN: Freq: Once | INTRAVENOUS | Status: AC

## 2021-12-12 MED ORDER — SODIUM CHLORIDE 0.9% FLUSH
10.0000 mL | Freq: Once | INTRAVENOUS | Status: AC
  Administered 2021-12-12: 10 mL

## 2021-12-12 NOTE — Telephone Encounter (Signed)
   CCM RN Visit Note   @DATE @ Name: [name] MRN: [id number]      DOB: 10/15/1955  Subjective: [name] is a 66 y.o. year old female who is a primary care patient of @PCP . The patient was referred to the Chronic Care Management team for assistance with care management needs subsequent to provider initiation of CCM services and plan of care.      An unsuccessful telephone outreach was attempted today to contact the patient about Chronic Care Management needs.    Plan:Telephone follow up appointment with care management team member scheduled for:  will attempt outreach within 1-2 weeks  Jacqlyn Larsen [facility], BSN RN Case Manager [place] at [employer] [phone]

## 2021-12-12 NOTE — Progress Notes (Signed)
Nutrition Follow-up:  Patient with extensive stage SCLC. She is currently receiving maintenance therapy with atezolizumab q21d.   Met with patient during infusion. She reports the past month has been "rough" for her. Patient recalls having 3 teeth pulled. She denies chewing difficulty. Patient underwent recent nerve block for chronic back pain which was not successful in relieving pain. She has not had an appetite, recalls eating small amounts of lunch and dinner meals. Yesterday pt had ham/mustard sandwich with couple of chips for lunch. She ate a few bites of hamburger steak and most of mashed potatoes/gravy for supper. Patient was previously snacking which she has not been doing. States her 65 year old grandson takes it and eats it. She finds empty boxes/bags in his room. Patient denies nausea, vomiting, diarrhea, constipation.    Medications: Remeron, lasix - dose increase 40 mg/day (11/15)  Labs: K 3.2, Cr 1.18  Anthropometrics: Wt 202 lb 4.8 oz   10/4 - 205 lb 9/13 - 200 lb 4.8 oz 8/28 - 198 lb 4.8 oz    NUTRITION DIAGNOSIS: Inadequate oral intake continues   INTERVENTION:  Discussed strategies for keeping snacks from being eaten by family Encouraged soft moist high protein foods for ease of intake Appetite stimulant (remeron) per MD  Support and encouragement provided     MONITORING, EVALUATION, GOAL: weight trends, intake   NEXT VISIT: Wednesday December 27 during infusion

## 2021-12-12 NOTE — Patient Instructions (Signed)
[facility]  Discharge Instructions: Thank you for choosing [facility] to provide your oncology and hematology care.   If you have a lab appointment with the [facility], please go directly to the [facility] and check in at the registration area.   Wear comfortable clothing and clothing appropriate for easy access to any Portacath or PICC line.   We strive to give you quality time with your provider. You may need to reschedule your appointment if you arrive late (15 or more minutes).  Arriving late affects you and other patients whose appointments are after yours.  Also, if you miss three or more appointments without notifying the office, you may be dismissed from the clinic at the provider's discretion.      For prescription refill requests, have your pharmacy contact our office and allow 72 hours for refills to be completed.    Today you received the following chemotherapy and/or immunotherapy agents: Tecentriq      To help prevent nausea and vomiting after your treatment, we encourage you to take your nausea medication as directed.  BELOW ARE SYMPTOMS THAT SHOULD BE REPORTED IMMEDIATELY: *FEVER GREATER THAN 100.4 F (38 C) OR HIGHER *CHILLS OR SWEATING *NAUSEA AND VOMITING THAT IS NOT CONTROLLED WITH YOUR NAUSEA MEDICATION *UNUSUAL SHORTNESS OF BREATH *UNUSUAL BRUISING OR BLEEDING *URINARY PROBLEMS (pain or burning when urinating, or frequent urination) *BOWEL PROBLEMS (unusual diarrhea, constipation, pain near the anus) TENDERNESS IN MOUTH AND THROAT WITH OR WITHOUT PRESENCE OF ULCERS (sore throat, sores in mouth, or a toothache) UNUSUAL RASH, SWELLING OR PAIN  UNUSUAL VAGINAL DISCHARGE OR ITCHING   Items with * indicate a potential emergency and should be followed up as soon as possible or go to the Emergency Department if any problems should occur.  Please show the CHEMOTHERAPY ALERT CARD or IMMUNOTHERAPY ALERT CARD at check-in to  the Emergency Department and triage nurse.  Should you have questions after your visit or need to cancel or reschedule your appointment, please contact [facility]  Dept: [phone]  and follow the prompts.  Office hours are 8:00 a.m. to 4:30 p.m. Monday - Friday. Please note that voicemails left after 4:00 p.m. may not be returned until the following business day.  We are closed weekends and major holidays. You have access to a nurse at all times for urgent questions. Please call the main number to the clinic Dept: [phone] and follow the prompts.   For any non-urgent questions, you may also contact your provider using MyChart. We now offer e-Visits for anyone 51 and older to request care online for non-urgent symptoms. For details visit [email or web address].   Also download the MyChart app! Go to the app store, search "MyChart", open the app, select , and log in with your MyChart username and password.  Masks are optional in the cancer centers. If you would like for your care team to wear a mask while they are taking care of you, please let them know. You may have one support person who is at least 65 years old accompany you for your appointments.

## 2021-12-12 NOTE — Progress Notes (Signed)
[place] [phone]   [phone]  PROGRESS NOTE  Patient Care Team: Allwardt, Randa Evens, PA-C as PCP - General (Physician Assistant) Milus Banister, MD as Attending Physician (Gastroenterology) Dr. Darylene Price, MD as Consulting Physician (Orthopedic Surgery) Latanya Maudlin, MD as Consulting Physician (Orthopedic Surgery) Kathrynn Ducking, MD (Inactive) as Consulting Physician (Neurology) Okey Regal, [place] as Consulting Physician (Optometry) Delice Lesch Lezlie Octave, MD as Consulting Physician (Neurology) Edythe Clarity, [facility] (Pharmacist)  Hematological/Oncological History # Small Cell Lung Cancer, Extensive Stage 07/05/2020: CT abdomen for lower abdominal pain. New pulmonary nodular density noted 07/06/2020: CT chest showed 2.2 cm macrolobulated right lower lobe pulmonary nodule (favored) versus pathologically enlarged infrahilar lymph node 10/13/2020: PET CT scan performed, findings show 2 cm right lower lobe lung mass is hypermetabolic and consistent with primary lung neoplasm. Additionally found hypermetabolic 17 mm soft tissue lesion between the descending duodenum and the pancreatic head  10/19/2020: EGD to biopsy hypermetabolic lymph node. Biopsy results show small cell lung cancer 10/26/2020: establish care with Dr. Lorenso Courier  11/13/2020: Cycle 1 Day 1 of Carbo/Etop/Atezolizumab 12/04/2020: Cycle 2 Day 1 of Carbo/Etop/Atezolizumab 11/22-11/25/2022: admitted for E. Coli bacteremia/sepsis. Start of Cycle 3 delayed. 01/02/2021: Cycle 3 Day 1 of Carbo/Etop/Atezolizumab 01/23/2021: Cycle 4 Day 1 of Carbo/Etop/Atezolizumab 02/22/2021: Cycle 5 Day 1 of Atezolizumab Maintenance. Delayed start due to patient's COVID infection.  03/14/2021: Cycle 6 Day 1 of Atezolizumab Maintenance 04/05/2021: Cycle 7 Day 1 of Atezolizumab Maintenance 04/25/2021: Cycle 8 Day 1 of Atezolizumab Maintenance 05/16/2021: Cycle 9 Day 1 of Atezolizumab Maintenance 06/06/2021:  Cycle 10 Day 1 of Atezolizumab Maintenance 06/27/2021:  Cycle 11 Day 1 of Atezolizumab Maintenance 07/18/2021: Cycle 12 Day 1 of Atezolizumab Maintenance 08/08/2021: Cycle 13 Day 1 of Atezolizumab Maintenance 08/29/2021: Cycle 14 Day 1 of Atezolizumab Maintenance 09/19/2021: Cycle 15 Day 1 of Atezolizumab Maintenance 10/10/2021: Cycle 16 Day 1 of Atezolizumab Maintenance 10/31/2021: Cycle 17 Day 1 of Atezolizumab Maintenance 11/21/2021: Cycle 18 Day 1 of Atezolizumab Maintenance 12/12/2021: Cycle 19 Day 1 of Atezolizumab Maintenance  Interval History:  [name] 66 y.o. female with medical history significant for extensive stage small cell lung cancer who presents for a follow up visit. The patient's last visit was on 11/21/2021. In the interim since the last visit she has completed cycle 18 of chemotherapy. She presents today to start cycle 19 of chemotherapy.  On exam today [name] reports she has struggled with fatigue during her last cycle.  She reports that she is lost about 5 pounds in weight.  She reports that she had been doing "dynamite" in the last 2 to 3 months but unfortunately this last cycle came unraveled.  She reports that she has not been able to do day-to-day activities like laundry without taking extended breaks due to fatigue.  She reports that he underwent a nerve block for pain in her back last week but unfortunately it made her pain worse and did not improve her situation.  She notes that after that shot she slept for approximately 2 days.  She notes that she is not having any muscle cramps in her legs and denies any cough, shortness of breath, or nausea/vomiting/diarrhea.  She denies fevers, chills, night sweats, chest pain or cough.She has no other complaints. A full 10 point ROS is listed below.  Overall she is willing and able to proceed with chemotherapy today.  Overall she is willing and able to proceed with treatment today.   MEDICAL HISTORY:  Past Medical  History:  Diagnosis Date   Allergic rhinitis    Anemia    Anxiety    Chicken pox    Chronic back pain    COPD (chronic obstructive pulmonary disease) (HCC)    Depression    DM (diabetes mellitus) ([place])    Essential hypertension    GERD (gastroesophageal reflux disease)    Headache    migraines   History of gastritis    EGD 2015   History of home oxygen therapy    2 liters at hs last 6 months   Hyperlipidemia    Hypothyroidism    Migraines    Osteoarthritis    oa   Scoliosis     SURGICAL HISTORY: Past Surgical History:  Procedure Laterality Date   APPENDECTOMY     1985   BIOPSY  07/24/2017   Procedure: BIOPSY;  Surgeon: Milus Banister, MD;  Location: [facility];  Service: Endoscopy;;   CARDIAC CATHETERIZATION N/A 10/31/2015   Procedure: Left Heart Cath and Coronary Angiography;  Surgeon: Leonie Man, MD;  Location: [facility];  Service: Cardiovascular;  Laterality: N/A;   CARPAL TUNNEL RELEASE Left    CARPAL TUNNEL RELEASE Right    CHOLECYSTECTOMY  late 1980's   COLONOSCOPY WITH PROPOFOL N/A 07/24/2017   Procedure: COLONOSCOPY WITH PROPOFOL;  Surgeon: Milus Banister, MD;  [facility]: [facility];  Service: Endoscopy;  Laterality: N/A;   ESOPHAGOGASTRODUODENOSCOPY N/A 07/24/2017   Procedure: ESOPHAGOGASTRODUODENOSCOPY (EGD);  Surgeon: Milus Banister, MD;  Location: [facility];  Service: Endoscopy;  Laterality: N/A;   ESOPHAGOGASTRODUODENOSCOPY (EGD) WITH PROPOFOL N/A 10/19/2020   Procedure: ESOPHAGOGASTRODUODENOSCOPY (EGD) WITH PROPOFOL;  Surgeon: Milus Banister, MD;  [facility]: [facility];  Service: Endoscopy;  Laterality: N/A;   FINE NEEDLE ASPIRATION N/A 10/19/2020   Procedure: FINE NEEDLE ASPIRATION (FNA) LINEAR;  Surgeon: Milus Banister, MD;  [facility]: [facility];  Service: Endoscopy;  Laterality: N/A;   GALLBLADDER SURGERY  1991   HIP CLOSED REDUCTION Right 01/08/2016   Procedure: CLOSED MANIPULATION HIP;  Surgeon: Susa Day, MD;   [facility]: [facility];  Service: Orthopedics;  Laterality: Right;   HIP CLOSED REDUCTION Right 01/19/2016   Procedure: ATTEMPTED CLOSED REDUCTION RIGHT HIP;  Surgeon: Wylene Simmer, MD;  Location: [facility];  Service: Orthopedics;  Laterality: Right;   HIP CLOSED REDUCTION Right 01/20/2016   Procedure: CLOSED REDUCTION RIGHT TOTAL HIP;  Surgeon: Paralee Cancel, MD;  Location: [facility];  Service: Orthopedics;  Laterality: Right;   HIP CLOSED REDUCTION Right 02/17/2016   Procedure: CLOSED REDUCTION RIGHT TOTAL HIP;  Surgeon: Rod Can, MD;  Location: [place];  Service: Orthopedics;  Laterality: Right;   HIP CLOSED REDUCTION Right 02/28/2016   Procedure: CLOSED REDUCTION HIP;  Surgeon: Nicholes Stairs, MD;  [facility]: [facility];  Service: Orthopedics;  Laterality: Right;   IR IMAGING GUIDED PORT INSERTION  11/01/2020   POLYPECTOMY  07/24/2017   Procedure: POLYPECTOMY;  Surgeon: Milus Banister, MD;  Location: [facility];  Service: Endoscopy;;   [place], with 1 ovary removed and 2 nd ovary removed 2003   TOTAL HIP ARTHROPLASTY Right    Original surgery 2006 with revision 2010   TOTAL HIP REVISION Right 01/01/2016   Procedure: TOTAL HIP REVISION;  Surgeon: Paralee Cancel, MD;  Location: [facility];  Service: Orthopedics;  Laterality: Right;   TOTAL HIP REVISION Right 03/02/2016   Procedure: TOTAL HIP REVISION;  Surgeon:  Paralee Cancel, MD;  Location: [facility];  Service: Orthopedics;  Laterality: Right;   TOTAL HIP REVISION Right 09/02/2016   Procedure: Right hip constrained liner- posterior;  Surgeon: Paralee Cancel, MD;  Location: [facility];  Service: Orthopedics;  Laterality: Right;   ULNAR NERVE TRANSPOSITION Right    UPPER ESOPHAGEAL ENDOSCOPIC ULTRASOUND (EUS) N/A 10/19/2020   Procedure: UPPER ESOPHAGEAL ENDOSCOPIC ULTRASOUND (EUS);  Surgeon: Milus Banister, MD;  Location: [facility];  Service: Endoscopy;  Laterality: N/A;  periduodenal lesion    SOCIAL  HISTORY: Social History   Socioeconomic History   Marital status: Married    Spouse name: Not on file   Number of children: 2   Years of education: Not on file   Highest education level: Not on file  Occupational History   Occupation: disabled   Occupation: disabled  Tobacco Use   Smoking status: Every Day    Packs/day: 2.00    Years: 46.00    Total pack years: 92.00    Types: Cigarettes   Smokeless tobacco: Never   Tobacco comments:    2 packs of cigarettes smoked daily [facility]J 04/10/21  Vaping Use   Vaping Use: Never used  Substance and Sexual Activity   Alcohol use: No   Drug use: No   Sexual activity: Not Currently    Partners: Male  Other Topics Concern   Not on file  Social History Narrative   Right handed    Caffeine~ 2 cups per day    Lives at home with husband (strained relationship)   Primary caretaker for disabled brother who had aneurism   Daughter died 07-03-18    Social Determinants of Health   Financial Resource Strain: Medium Risk (05/03/2021)   Overall Financial Resource Strain (CARDIA)    Difficulty of Paying Living Expenses: Somewhat hard  Food Insecurity: No Food Insecurity (05/03/2021)   Hunger Vital Sign    Worried About Running Out of Food in the Last Year: Never true    Ran Out of Food in the Last Year: Never true  Transportation Needs: No Transportation Needs (12/27/2019)   PRAPARE - Hydrologist (Medical): No    Lack of Transportation (Non-Medical): No  Physical Activity: Inactive (05/03/2021)   Exercise Vital Sign    Days of Exercise per Week: 0 days    Minutes of Exercise per Session: 0 min  Stress: Stress Concern Present (05/03/2021)   [place]    Feeling of Stress : To some extent  Social Connections: Moderately Integrated (05/03/2021)   Social Connection and Isolation Panel [NHANES]    Frequency of Communication with Friends and Family: More than  three times a week    Frequency of Social Gatherings with Friends and Family: Once a week    Attends Religious Services: 1 to 4 times per year    Active Member of [employer] or Organizations: No    Attends Archivist Meetings: Never    Marital Status: Married  Human resources officer Violence: Not At Risk (05/03/2021)   Humiliation, Afraid, Rape, and Kick questionnaire    Fear of Current or Ex-Partner: No    Emotionally Abused: No    Physically Abused: No    Sexually Abused: No    FAMILY HISTORY: Family History  Problem Relation Age of Onset   COPD Mother    Heart disease Mother    Lung disease Father        Asbestosis  Heart attack Father    Heart disease Father    Cerebral aneurysm Brother    Aneurysm Brother        Brain   Drug abuse Daughter    Epilepsy Son    Alcohol abuse Son    Drug abuse Son    Arthritis Maternal Grandmother    Heart disease Maternal Grandmother    Asthma Maternal Grandfather    Cancer Maternal Grandfather    Arthritis Paternal Grandmother    Heart disease Paternal Grandmother    Stroke Paternal Grandmother    Early death Paternal Grandfather    Heart disease Paternal Grandfather     ALLERGIES:  is allergic to metformin and related, nsaids, wellbutrin [bupropion], aleve [naproxen sodium], codeine, penicillins, and sulfonamide derivatives.  MEDICATIONS:  Current Outpatient Medications  Medication Sig Dispense Refill   albuterol (PROAIR HFA) 108 (90 Base) MCG/ACT inhaler 2 puffs every 4 hours as needed only  if your can't catch your breath 18 g 3   ALPRAZolam (XANAX) 1 MG tablet TAKE 1 TABLET BY MOUTH THREE TIMES DAILY AS NEEDED FOR ANXIETY 90 tablet 2   atorvastatin (LIPITOR) 20 MG tablet TAKE 1 TABLET BY MOUTH AT BEDTIME 90 tablet 1   benzonatate (TESSALON) 100 MG capsule TAKE 1 CAPSULE BY MOUTH THREE TIMES DAILY 30 capsule 1   Budeson-Glycopyrrol-Formoterol (BREZTRI AEROSPHERE) 160-9-4.8 MCG/ACT AERO Inhale 2 puffs into the lungs in the morning  and at bedtime. 10.7 g 3   cyclobenzaprine (FLEXERIL) 10 MG tablet Take 1 tablet (10 mg total) by mouth 3 (three) times daily as needed for muscle spasms. 90 tablet 5   DULoxetine (CYMBALTA) 60 MG capsule TAKE TWO CAPSULES BY MOUTH DAILY 60 capsule 5   fluticasone (FLONASE) 50 MCG/ACT nasal spray INSTILL 2 SPRAYS IN EACH NOSTRIL EVERY DAY 16 g 6   fluticasone-salmeterol (ADVAIR DISKUS) 250-50 MCG/ACT AEPB Inhale 1 puff into the lungs in the morning and at bedtime. 60 each 5   furosemide (LASIX) 20 MG tablet TAKE 1 TABLET BY MOUTH TWICE DAILY. If swelling improves can hold medication. 90 tablet 0   HYDROcodone-acetaminophen (NORCO) 10-325 MG tablet TAKE 1 TABLET BY MOUTH EVERY 4 HOURS AS NEEDED max FIVE tablets DAILY 150 tablet 0   levothyroxine (SYNTHROID) 137 MCG tablet Take 1 tablet (137 mcg total) by mouth daily before breakfast. 30 tablet 5   loratadine (CLARITIN) 10 MG tablet Take 10 mg by mouth daily.     meloxicam (MOBIC) 15 MG tablet TAKE 1 TABLET BY MOUTH EVERY DAY 30 tablet 2   methadone (DOLOPHINE) 5 MG tablet Take 1 tablet (5 mg total) by mouth every 12 (twelve) hours. 60 tablet 0   OLANZapine (ZYPREXA) 10 MG tablet Take 1 tablet (10 mg total) by mouth at bedtime. 30 tablet 2   omeprazole (PRILOSEC) 40 MG capsule TAKE ONE CAPSULE BY MOUTH TWICE DAILY 60 capsule 0   ondansetron (ZOFRAN) 8 MG tablet TAKE 2 TABLETS BY MOUTH TWICE DAILY AS NEEDED 60 tablet 0   potassium chloride (KLOR-CON M) 10 MEQ tablet Take 2 tablets (20 mEq total) by mouth daily. 180 tablet 0   prochlorperazine (COMPAZINE) 10 MG tablet Take 1 tablet (10 mg total) by mouth every 6 (six) hours as needed for nausea or vomiting. 30 tablet 0   triamterene-hydrochlorothiazide (MAXZIDE-25) 37.5-25 MG tablet TAKE 1 TABLET BY MOUTH DAILY - EMERGENCY REFILL FAXED DR. 90 tablet 2   No current facility-administered medications for this visit.    REVIEW OF SYSTEMS:  Constitutional: ( - ) fevers, ( - )  chills , ( - ) night  sweats Eyes: ( - ) blurriness of vision, ( - ) double vision, ( - ) watery eyes Ears, nose, mouth, throat, and face: ( - ) mucositis, ( - ) sore throat Respiratory: ( - ) cough, ( +) dyspnea, ( - ) wheezes Cardiovascular: ( - ) palpitation, ( - ) chest discomfort, ( + ) lower extremity swelling Gastrointestinal:  ( -) nausea, ( - ) heartburn, ( - ) change in bowel habits Skin: ( - ) abnormal skin rashes Lymphatics: ( - ) new lymphadenopathy, ( - ) easy bruising Neurological: (+ ) numbness, ( - ) tingling, ( - ) new weaknesses Behavioral/Psych: ( - ) mood change, ( - ) new changes  All other systems were reviewed with the patient and are negative.  PHYSICAL EXAMINATION: ECOG PERFORMANCE STATUS: 1 - Symptomatic but completely ambulatory  Vitals:   12/12/21 1134  BP: 137/82  Pulse: 96  Resp: 17  Temp: 98.2 F (36.8 C)  SpO2: 97%   Filed Weights   12/12/21 1134  Weight: 202 lb 4.8 oz (91.8 kg)    GENERAL: Well-appearing middle-age Caucasian female, alert, no distress and comfortable SKIN: skin color, texture, turgor are normal, no rashes or significant lesions EYES: conjunctiva are pink and non-injected, sclera clear LUNGS:  normal breathing effort. Diffuse wheezing hear on ausculation.  HEART: regular rate & rhythm and no murmurs. Mild bilateral lower extremity edema Musculoskeletal: no cyanosis of digits and no clubbing  PSYCH: alert & oriented x 3, fluent speech NEURO: no focal motor/sensory deficits  LABORATORY DATA:  I have reviewed the data as listed    Latest Ref Rng & Units 12/12/2021   10:51 AM 11/21/2021    7:54 AM 10/31/2021   10:03 AM  CBC  WBC 4.0 - 10.5 K/uL 11.4  8.1  7.9   Hemoglobin 12.0 - 15.0 g/dL 12.1  12.2  12.1   Hematocrit 36.0 - 46.0 % 38.2  38.6  37.5   Platelets 150 - 400 K/uL 232  185  179        Latest Ref Rng & Units 12/12/2021   10:51 AM 11/21/2021    7:54 AM 10/31/2021   10:03 AM  CMP  Glucose 70 - 99 mg/dL 97  96  135   BUN 8 - 23  mg/dL 17  13  9    Creatinine 0.44 - 1.00 mg/dL 1.18  1.12  0.95   Sodium 135 - 145 mmol/L 136  138  132   Potassium 3.5 - 5.1 mmol/L 3.2  3.1  3.1   Chloride 98 - 111 mmol/L 97  98  93   CO2 22 - 32 mmol/L 32  34  35   Calcium 8.9 - 10.3 mg/dL 9.0  8.6  8.3   Total Protein 6.5 - 8.1 g/dL 6.7  6.2  5.7   Total Bilirubin 0.3 - 1.2 mg/dL 0.6  0.5  0.5   Alkaline Phos 38 - 126 U/L 98  95  80   AST 15 - 41 U/L 12  13  23    ALT 0 - 44 U/L 6  11  18      No results found for: "MPROTEIN" Lab Results  Component Value Date   KPAFRELGTCHN 0.75 06/30/2014   LAMBDASER 3.78 (H) 06/30/2014   KAPLAMBRATIO 0.20 (L) 06/30/2014     RADIOGRAPHIC STUDIES: No results found.  ASSESSMENT & PLAN PATTIJO [name] [address]  y.o. female with medical history significant for extensive stage small cell lung cancer who presents for a follow up visit.   After review of the labs, review of the records, and discussion with the patient the patients findings are most consistent with extensive stage small cell lung cancer with metastasis from the right lower lobe to the lymph nodes of the abdomen.  At this time we will pursue triple therapy with carboplatin, etoposide, and atezolizumab.  After 4 cycles we will convert to maintenance atezolizumab alone.  We previously discussed the risks and benefits of this therapy and the patient was in agreement to proceed with this treatment.  The treatment of choice consist of carboplatin, etoposide, and atezolizumab.  The regimen consists of carboplatin AUC of 5 IV on day 1, etoposide 100 mg per metered squared IV on day 1, 2, and 3 and atezolizumab 1200 mg on day 1.  This continues for 21-day cycles.  After 4 cycles the patient proceeds with atezolizumab maintenance therapy alone.   #Small Cell Lung Cancer, Extensive Stage -- MRI of the brain shows no evidence of intracranial spread --Findings are currently consistent with metastatic small cell lung cancer with metastatic spread to the  lymph nodes of the abdomen --Prognosis is poor with anticipated life span of less than 12 months -- Patient has extensive stage due to metastatic spread to lymph nodes in the abdomen --Plan to proceed with carboplatin, etoposide, and atezolizumab --will consider prophylactic cranial radiation after start of chemotherapy.  Plan: --today is Cycle 19 Day 1 of Atezolizumab maintenance --labs today were reviewed and adequate for treatment. Hgb 12.1, MCV 86, white blood cell count 11.4, and platelets of 232.  Creatinine 1.18 with normal AST and ALT --CT scan q 3 months. Last scan in Sept 2023 showed continued excellent response to therapy. CT scan next in Dec 2023.  --RTC in 3 weeks for next cycle of maintenance atezolizumab   #Supportive Care -- chemotherapy education complete -- port placed -- zofran 8mg  q8H PRN and compazine 10mg  PO q6H for nausea -- EMLA cream for port -- Will add mirtazapine in order to help with appetite/sleep  #Nausea: --Symptoms improved with zofran and olanzapine.  --Added IV zofran to infusion premeds for better symptom control.   #Hypokalemia:  --currently taking potassium chloride 20 mEq PO daily, increase to 20 meq BID --potassium level is still 3.2 today. Continue PO supplementation.   #Lower extremity edema-worsening --Currently takes lasix 20 mg daily, recommending increasing to q 6 hours x 2 doses daily.  --Improving. Continue to monitor.   #Neuropathy involving fingers/feet: --Patient currently takes Cymbalta   Orders Placed This Encounter  Procedures   TSH    Standing Status:   Future    Standing Expiration Date:   02/13/2023   T4    Standing Status:   Future    Standing Expiration Date:   02/13/2023   CBC with Differential ([facility] Only)    Standing Status:   Future    Standing Expiration Date:   02/14/2023   CMP ([place] only)    Standing Status:   Future    Standing Expiration Date:   02/14/2023   All questions were answered.  The patient knows to call the clinic with any problems, questions or concerns.  I have spent a total of 30 minutes minutes of face-to-face and non-face-to-face time, preparing to see the patient, performing a medically appropriate examination, counseling and educating the patient, ordering medications/tests, documenting clinical information in the  electronic health record, and care coordination.   Ledell Peoples, MD Department of Hematology/Oncology [place] [facility] Phone: [phone] Pager: [phone] Email: [email or web address]  12/12/2021 11:59 AM

## 2021-12-13 ENCOUNTER — Encounter: Payer: Self-pay | Admitting: *Deleted

## 2021-12-13 ENCOUNTER — Ambulatory Visit (INDEPENDENT_AMBULATORY_CARE_PROVIDER_SITE_OTHER): Payer: Medicare Other | Admitting: *Deleted

## 2021-12-13 ENCOUNTER — Other Ambulatory Visit: Payer: Self-pay | Admitting: *Deleted

## 2021-12-13 DIAGNOSIS — J449 Chronic obstructive pulmonary disease, unspecified: Secondary | ICD-10-CM

## 2021-12-13 LAB — T4: T4, Total: 9 ug/dL (ref 4.5–12.0)

## 2021-12-13 MED ORDER — POTASSIUM CHLORIDE CRYS ER 20 MEQ PO TBCR: 20.0000 meq | EXTENDED_RELEASE_TABLET | Freq: Two times a day (BID) | ORAL | 2 refills | Status: DC

## 2021-12-13 NOTE — Chronic Care Management (AMB) (Signed)
Chronic Care Management   CCM RN Visit Note  12/13/2021 Name: [name] MRN: [id number] DOB: 1955-09-20  Subjective: [name] is a 66 y.o. year old female who is a primary care patient of Allwardt, Alyssa M, PA-C. The patient was referred to the Chronic Care Management team for assistance with care management needs subsequent to provider initiation of CCM services and plan of care.    Today's Visit:  Engaged with patient by telephone for initial visit.     SDOH Interventions Today    Flowsheet Row Most Recent Value  SDOH Interventions   Food Insecurity Interventions Intervention Not Indicated  Housing Interventions Intervention Not Indicated  Transportation Interventions Intervention Not Indicated  Utilities Interventions Intervention Not Indicated  Depression Interventions/Treatment  Currently on Treatment  [pt refuses LCSW services]  Financial Strain Interventions Intervention Not Indicated  Physical Activity Interventions Intervention Not Indicated  Stress Interventions Intervention Not Indicated  Social Connections Interventions Intervention Not Indicated         Goals Addressed             This Visit's Progress    CCM (COPD) EXPECTED OUTCOME: MONITOR, SELF-MANAGE AND REDUCE SYMPTOMS OF COPD       Current Barriers:  Knowledge Deficits related to COPD management Chronic Disease Management support and education needs related to COPD Patient reports she lives with her spouse and he assists with cooking, housekeeping, and laundry, pt continues to drive, has walker to use as needed, has built in shower seat. Patient reports she continues to smoke 2 ppd and not interested in smoking cessation Patient continues taking treatments for small cell lung cancer Depression PHQ-9=12, pt is on medication and has tried counseling in the past, states this did not work well for her and declines LCSW services  Planned Interventions: Provided patient with basic written and  verbal COPD education on self care/management/and exacerbation prevention Advised patient to track and manage COPD triggers Provided instruction about proper use of medications used for management of COPD including inhalers Advised patient to self assesses COPD action plan zone and make appointment with provider if in the yellow zone for 48 hours without improvement Advised patient to engage in light exercise as tolerated 3-5 days a week to aid in the the management of COPD Provided education about and advised patient to utilize infection prevention strategies to reduce risk of respiratory infection Discussed the importance of adequate rest and management of fatigue with COPD Screening for signs and symptoms of depression related to chronic disease state  Assessed social determinant of health barriers  Symptom Management: Take medications as prescribed   Attend all scheduled provider appointments Call pharmacy for medication refills 3-7 days in advance of running out of medications Attend church or other social activities Call provider office for new concerns or questions  identify and remove indoor air pollutants limit outdoor activity during cold weather do breathing exercises every day develop a rescue plan eliminate symptom triggers at home follow rescue plan if symptoms flare-up eat healthy/prescribed diet: low sodium, heart healthy get at least 7 to 8 hours of sleep at night Look over education sent via My Chart- COPD action plan Practice good handwashing, avoid sick people, wear a mask as needed  Follow Up Plan: Telephone follow up appointment with care management team member scheduled for:  02/07/22 at 130 pm       CCM (HYPERTENSION) EXPECTED OUTCOME: MONITOR, SELF-MANAGE AND REDUCE SYMPTOMS OF HYPERTENSION       Current Barriers:  Knowledge Deficits related to Hypertension management Chronic Disease Management support and education needs related to Hypertension Patient  reports she checks blood pressure and readings are usually good, pt does not follow a special diet. Patient has had 2 falls with injury this past year (broken toes)  Planned Interventions: Evaluation of current treatment plan related to hypertension self management and patient's adherence to plan as established by provider;   Reviewed prescribed diet low sodium Reviewed medications with patient and discussed importance of compliance;  Counseled on the importance of exercise goals with target of 150 minutes per week Discussed plans with patient for ongoing care management follow up and provided patient with direct contact information for care management team; Advised patient, providing education and rationale, to monitor blood pressure daily and record, calling PCP for findings outside established parameters;  Provided education on prescribed diet low sodium;  Discussed complications of poorly controlled blood pressure such as heart disease, stroke, circulatory complications, vision complications, kidney impairment, sexual dysfunction;  Screening for signs and symptoms of depression related to chronic disease state;  Assessed social determinant of health barriers;  Reviewed safety precautions Reviewed LCSW services and pt declines  Symptom Management: Take medications as prescribed   Attend all scheduled provider appointments Call pharmacy for medication refills 3-7 days in advance of running out of medications Attend church or other social activities Call provider office for new concerns or questions  check blood pressure 3 times per week write blood pressure results in a log or diary keep a blood pressure log take blood pressure log to all doctor appointments keep all doctor appointments take medications for blood pressure exactly as prescribed report new symptoms to your doctor eat more whole grains, fruits and vegetables, lean meats and healthy fats Look over education sent via My  Chart- low sodium diet fall prevention strategies: change position slowly, use assistive device such as walker or cane (per provider recommendations) when walking, keep walkways clear, have good lighting in room. It is important to contact your provider if you have any falls, maintain muscle strength/tone by exercise per provider recommendations.  Follow Up Plan: Telephone follow up appointment with care management team member scheduled for: 02/07/22 at 130 pm          Plan:Telephone follow up appointment with care management team member scheduled for:  02/07/22 at 130 pm  Jacqlyn Larsen [facility], BSN RN Case Manager [employer] at [employer] [phone]

## 2021-12-13 NOTE — Plan of Care (Signed)
Chronic Care Management Provider Comprehensive Care Plan    12/13/2021 Name: [name] MRN: [id number] DOB: [birth year]  Referral to Chronic Care Management (CCM) services was placed by Provider:  Theresa Duty on Date: 12/07/21.  Chronic Condition 1: COPD Provider Assessment and Plan COPD  --Will provide Trelegy samples. --START Advair 250-50 mcg ONE puff TWICE a day --CONTINUE Albuterol AS NEEDED shortness of breath or wheezing. REFILL   Expected Outcome/Goals Addressed This Visit (Provider CCM goals/Provider Assessment and plan  CCM (COPD) EXPECTED OUTCOME: MONITOR, SELF-MANAGE AND REDUCE SYMPTOMS OF COPD  Symptom Management Condition 1: Take medications as prescribed   Attend all scheduled provider appointments Call pharmacy for medication refills 3-7 days in advance of running out of medications Attend church or other social activities Call provider office for new concerns or questions  identify and remove indoor air pollutants limit outdoor activity during cold weather do breathing exercises every day develop a rescue plan eliminate symptom triggers at home follow rescue plan if symptoms flare-up eat healthy/prescribed diet: low sodium, heart healthy get at least 7 to 8 hours of sleep at night Look over education sent via My Chart- COPD action plan Practice good handwashing, avoid sick people, wear a mask as needed  Chronic Condition 2: HYPERTENSION Provider Assessment and Plan  Pt is a 66 yr old female with hx of newly dx'd COPD, still smoking, HTN, DM- diet controlled; anxiety and depression;  Scoliosis and deg back issues,  Generalized OA;  chronic pain- here for f/u of chronic pain issues. With new 2.2 cm R lower pulmonary nodule and hemorrhoids.  Now has dx of Small cell lung cancer extensive stage per oncology note   Still smoking- hasn't really slowed down.        Expected Outcome/Goals Addressed This Visit (Provider CCM goals/Provider Assessment and  plan  CCM (HYPERTENSION) EXPECTED OUTCOME: MONITOR, SELF-MANAGE AND REDUCE SYMPTOMS OF HYPERTENSION  Symptom Management Condition 2: Take medications as prescribed   Attend all scheduled provider appointments Call pharmacy for medication refills 3-7 days in advance of running out of medications Attend church or other social activities Call provider office for new concerns or questions  check blood pressure 3 times per week write blood pressure results in a log or diary keep a blood pressure log take blood pressure log to all doctor appointments keep all doctor appointments take medications for blood pressure exactly as prescribed report new symptoms to your doctor eat more whole grains, fruits and vegetables, lean meats and healthy fats Look over education sent via My Chart- low sodium diet fall prevention strategies: change position slowly, use assistive device such as walker or cane (per provider recommendations) when walking, keep walkways clear, have good lighting in room. It is important to contact your provider if you have any falls, maintain muscle strength/tone by exercise per provider recommendations.  Problem List Patient Active Problem List   Diagnosis Date Noted   Nausea without vomiting 03/14/2021   Abnormality of gait 03/05/2021   Type 2 diabetes mellitus with diabetic neuropathy, without long-term current use of insulin ([place]) 01/08/2021   Port-A-Cath in place 01/02/2021   E. coli sepsis ([place]) 12/19/2020   Nocturnal hypoxemia 11/27/2020   Small cell lung cancer ([place]) 10/27/2020   Pulmonary nodule 08/02/2020   Tobacco abuse 07/13/2020   Arthritis of carpometacarpal ([place]) joint of both thumbs 05/29/2020   Diabetes mellitus, new onset ([place]) 04/12/2019   Tremor 11/05/2018   Chronic respiratory failure with hypoxia ([place]) 01/12/2018   GERD  with esophagitis 11/26/2017   Dysphagia 08/20/2017   Chronic pain syndrome 08/04/2017   Comorbid sleep-related hypoventilation  08/04/2017   OSA (obstructive sleep apnea) 08/04/2017   Hepatic steatosis 08/04/2017   Polyp of ascending colon    Diverticulosis of colon without hemorrhage    Gastritis and gastroduodenitis    Hypothyroidism 07/22/2017   Anxiety and depression 07/22/2017   Iron deficiency anemia 07/22/2017   Pilar cyst 07/22/2017   Morbid obesity due to excess calories ([place]) 03/19/2017   Anemia 03/19/2017   Chronic obstructive pulmonary disease ([place]) 03/18/2017   Degenerative lumbar spinal stenosis 02/17/2017   S/P right hip revision 09/02/2016   S/P hip replacement 09/02/2016   Instability of prosthetic hip ([place]) 03/01/2016   Dislocation of hip prosthesis ([place]) 02/17/2016   Abnormal nuclear stress test: Intermediate Risk 10/31/2015   Atypical angina 10/31/2015   Trigger finger, acquired 08/20/2012   Cigarette smoker 12/12/2008   Hyperlipidemia 11/17/2008   Anxiety state 11/16/2008   HYPERTENSION, BENIGN 11/16/2008   Primary osteoarthritis involving multiple joints 11/16/2008    Medication Management  Current Outpatient Medications:    albuterol (PROAIR HFA) 108 (90 Base) MCG/ACT inhaler, 2 puffs every 4 hours as needed only  if your can't catch your breath, Disp: 18 g, Rfl: 3   ALPRAZolam (XANAX) 1 MG tablet, TAKE 1 TABLET BY MOUTH THREE TIMES DAILY AS NEEDED FOR ANXIETY, Disp: 90 tablet, Rfl: 2   atorvastatin (LIPITOR) 20 MG tablet, TAKE 1 TABLET BY MOUTH AT BEDTIME, Disp: 90 tablet, Rfl: 1   benzonatate (TESSALON) 100 MG capsule, TAKE 1 CAPSULE BY MOUTH THREE TIMES DAILY, Disp: 30 capsule, Rfl: 1   Budeson-Glycopyrrol-Formoterol (BREZTRI AEROSPHERE) 160-9-4.8 MCG/ACT AERO, Inhale 2 puffs into the lungs in the morning and at bedtime., Disp: 10.7 g, Rfl: 3   cyclobenzaprine (FLEXERIL) 10 MG tablet, Take 1 tablet (10 mg total) by mouth 3 (three) times daily as needed for muscle spasms., Disp: 90 tablet, Rfl: 5   DULoxetine (CYMBALTA) 60 MG capsule, TAKE TWO CAPSULES BY MOUTH DAILY, Disp: 60  capsule, Rfl: 5   fluticasone (FLONASE) 50 MCG/ACT nasal spray, INSTILL 2 SPRAYS IN EACH NOSTRIL EVERY DAY, Disp: 16 g, Rfl: 6   furosemide (LASIX) 20 MG tablet, TAKE 1 TABLET BY MOUTH TWICE DAILY. If swelling improves can hold medication., Disp: 90 tablet, Rfl: 0   HYDROcodone-acetaminophen (NORCO) 10-325 MG tablet, TAKE 1 TABLET BY MOUTH EVERY 4 HOURS AS NEEDED max FIVE tablets DAILY, Disp: 150 tablet, Rfl: 0   levothyroxine (SYNTHROID) 137 MCG tablet, Take 1 tablet (137 mcg total) by mouth daily before breakfast., Disp: 30 tablet, Rfl: 5   loratadine (CLARITIN) 10 MG tablet, Take 10 mg by mouth daily., Disp: , Rfl:    meloxicam (MOBIC) 15 MG tablet, TAKE 1 TABLET BY MOUTH EVERY DAY, Disp: 30 tablet, Rfl: 2   methadone (DOLOPHINE) 5 MG tablet, Take 1 tablet (5 mg total) by mouth every 12 (twelve) hours., Disp: 60 tablet, Rfl: 0   OLANZapine (ZYPREXA) 10 MG tablet, Take 1 tablet (10 mg total) by mouth at bedtime., Disp: 30 tablet, Rfl: 2   omeprazole (PRILOSEC) 40 MG capsule, TAKE ONE CAPSULE BY MOUTH TWICE DAILY, Disp: 60 capsule, Rfl: 0   ondansetron (ZOFRAN) 8 MG tablet, TAKE 2 TABLETS BY MOUTH TWICE DAILY AS NEEDED, Disp: 60 tablet, Rfl: 0   potassium chloride SA (KLOR-CON M) 20 MEQ tablet, Take 1 tablet (20 mEq total) by mouth 2 (two) times daily., Disp: 60 tablet, Rfl: 2  prochlorperazine (COMPAZINE) 10 MG tablet, Take 1 tablet (10 mg total) by mouth every 6 (six) hours as needed for nausea or vomiting., Disp: 30 tablet, Rfl: 0   triamterene-hydrochlorothiazide (MAXZIDE-25) 37.5-25 MG tablet, TAKE 1 TABLET BY MOUTH DAILY, Disp: 90 tablet, Rfl: 2   fluticasone-salmeterol (ADVAIR DISKUS) 250-50 MCG/ACT AEPB, Inhale 1 puff into the lungs in the morning and at bedtime. (Patient not taking: Reported on 12/13/2021), Disp: 60 each, Rfl: 5  Cognitive Assessment Identity Confirmed: : Name; DOB Cognitive Status: Normal   Functional Assessment Hearing Difficulty or Deaf: yes Hearing Management:  bil hearing aides Wear Glasses or Blind: yes Vision Management: can see well w/ glasses Concentrating, Remembering or Making Decisions Difficulty (CP): no Difficulty Communicating: no Difficulty Eating/Swallowing: no Walking or Climbing Stairs Difficulty: no Dressing/Bathing Difficulty: no Doing Errands Independently Difficulty (such as shopping) (CP): no   Caregiver Assessment  Primary Source of Support/Comfort: spouse Name of Support/Comfort Primary Source: Pinky Ravan spouse People in Home: spouse Name(s) of People in Home: spouse Desiray Orchard   Planned Interventions  Provided patient with basic written and verbal COPD education on self care/management/and exacerbation prevention Advised patient to track and manage COPD triggers Provided instruction about proper use of medications used for management of COPD including inhalers Advised patient to self assesses COPD action plan zone and make appointment with provider if in the yellow zone for 48 hours without improvement Advised patient to engage in light exercise as tolerated 3-5 days a week to aid in the the management of COPD Provided education about and advised patient to utilize infection prevention strategies to reduce risk of respiratory infection Discussed the importance of adequate rest and management of fatigue with COPD Screening for signs and symptoms of depression related to chronic disease state  Assessed social determinant of health barriers Evaluation of current treatment plan related to hypertension self management and patient's adherence to plan as established by provider;   Reviewed prescribed diet low sodium Reviewed medications with patient and discussed importance of compliance;  Counseled on the importance of exercise goals with target of 150 minutes per week Discussed plans with patient for ongoing care management follow up and provided patient with direct contact information for care management team; Advised  patient, providing education and rationale, to monitor blood pressure daily and record, calling PCP for findings outside established parameters;  Provided education on prescribed diet low sodium;  Discussed complications of poorly controlled blood pressure such as heart disease, stroke, circulatory complications, vision complications, kidney impairment, sexual dysfunction;  Screening for signs and symptoms of depression related to chronic disease state;  Assessed social determinant of health barriers;  Reviewed safety precautions Reviewed LCSW services and pt declines  Interaction and coordination with outside resources, practitioners, and providers See CCM Referral  Care Plan: Available in MyChart

## 2021-12-13 NOTE — Patient Instructions (Signed)
Please call the care guide team at [phone] if you need to cancel or reschedule your appointment.   If you are experiencing a Mental Health or [place] or need someone to talk to, please call the Suicide and Crisis Lifeline: 988 call the [place] National Suicide Prevention Lifeline: 838-872-6539 or TTY: (217)045-6797 TTY (276) 781-9136) to talk to a trained counselor call 1-800-273-TALK (toll free, 24 hour hotline) go to [facility] [place] [phone]) call the [facility]: [phone] call 911   Following is a copy of your full provider care plan:   Goals Addressed             This Visit's Progress    CCM (COPD) EXPECTED OUTCOME: MONITOR, SELF-MANAGE AND REDUCE SYMPTOMS OF COPD       Current Barriers:  Knowledge Deficits related to COPD management Chronic Disease Management support and education needs related to COPD Patient reports she lives with her spouse and he assists with cooking, housekeeping, and laundry, pt continues to drive, has walker to use as needed, has built in shower seat. Patient reports she continues to smoke 2 ppd and not interested in smoking cessation Patient continues taking treatments for small cell lung cancer Depression PHQ-9=12, pt is on medication and has tried counseling in the past, states this did not work well for her and declines LCSW services  Planned Interventions: Provided patient with basic written and verbal COPD education on self care/management/and exacerbation prevention Advised patient to track and manage COPD triggers Provided instruction about proper use of medications used for management of COPD including inhalers Advised patient to self assesses COPD action plan zone and make appointment with provider if in the yellow zone for 48 hours without improvement Advised patient to engage in light exercise as tolerated 3-5 days a week to aid in the the  management of COPD Provided education about and advised patient to utilize infection prevention strategies to reduce risk of respiratory infection Discussed the importance of adequate rest and management of fatigue with COPD Screening for signs and symptoms of depression related to chronic disease state  Assessed social determinant of health barriers  Symptom Management: Take medications as prescribed   Attend all scheduled provider appointments Call pharmacy for medication refills 3-7 days in advance of running out of medications Attend church or other social activities Call provider office for new concerns or questions  identify and remove indoor air pollutants limit outdoor activity during cold weather do breathing exercises every day develop a rescue plan eliminate symptom triggers at home follow rescue plan if symptoms flare-up eat healthy/prescribed diet: low sodium, heart healthy get at least 7 to 8 hours of sleep at night Look over education sent via My Chart- COPD action plan Practice good handwashing, avoid sick people, wear a mask as needed  Follow Up Plan: Telephone follow up appointment with care management team member scheduled for:  02/07/22 at 130 pm       CCM (HYPERTENSION) EXPECTED OUTCOME: MONITOR, SELF-MANAGE AND REDUCE SYMPTOMS OF HYPERTENSION       Current Barriers:  Knowledge Deficits related to Hypertension management Chronic Disease Management support and education needs related to Hypertension Patient reports she checks blood pressure and readings are usually good, pt does not follow a special diet. Patient has had 2 falls with injury this past year (broken toes)  Planned Interventions: Evaluation of current treatment plan related to hypertension self management and patient's adherence to plan as established by  provider;   Reviewed prescribed diet low sodium Reviewed medications with patient and discussed importance of compliance;  Counseled on the  importance of exercise goals with target of 150 minutes per week Discussed plans with patient for ongoing care management follow up and provided patient with direct contact information for care management team; Advised patient, providing education and rationale, to monitor blood pressure daily and record, calling PCP for findings outside established parameters;  Provided education on prescribed diet low sodium;  Discussed complications of poorly controlled blood pressure such as heart disease, stroke, circulatory complications, vision complications, kidney impairment, sexual dysfunction;  Screening for signs and symptoms of depression related to chronic disease state;  Assessed social determinant of health barriers;  Reviewed safety precautions Reviewed LCSW services and pt declines  Symptom Management: Take medications as prescribed   Attend all scheduled provider appointments Call pharmacy for medication refills 3-7 days in advance of running out of medications Attend church or other social activities Call provider office for new concerns or questions  check blood pressure 3 times per week write blood pressure results in a log or diary keep a blood pressure log take blood pressure log to all doctor appointments keep all doctor appointments take medications for blood pressure exactly as prescribed report new symptoms to your doctor eat more whole grains, fruits and vegetables, lean meats and healthy fats Look over education sent via My Chart- low sodium diet fall prevention strategies: change position slowly, use assistive device such as walker or cane (per provider recommendations) when walking, keep walkways clear, have good lighting in room. It is important to contact your provider if you have any falls, maintain muscle strength/tone by exercise per provider recommendations.  Follow Up Plan: Telephone follow up appointment with care management team member scheduled for: 02/07/22 at 130  pm          Patient verbalizes understanding of instructions and care plan provided today and agrees to view in [place]. Active MyChart status and patient understanding of how to access instructions and care plan via MyChart confirmed with patient.     Telephone follow up appointment with care management team member scheduled for:  02/07/22 at 130 pm  COPD Action Plan A COPD action plan is a description of what to do when you have a flare (exacerbation) of chronic obstructive pulmonary disease (COPD). Your action plan is a color-coded plan that lists the symptoms that indicate whether your condition is under control and what actions to take. If you have symptoms in the green zone, it means you are doing well that day. If you have symptoms in the yellow zone, it means you are having a bad day or an exacerbation. If you have symptoms in the red zone, you need urgent medical care. Follow the plan that you and your health care provider developed. Review your plan with your health care provider at each visit. Red zone Symptoms in this zone mean that you should get medical help right away. They include: Feeling very short of breath, even when you are resting. Not being able to do any activities because of poor breathing. Not being able to sleep because of poor breathing. Fever or shaking chills. Feeling confused or very sleepy. Chest pain. Coughing up blood. If you have any of these symptoms, call emergency services (911 in the [place] or go to the nearest emergency room. Yellow zone Symptoms in this zone mean that your condition may be getting worse. They include: Feeling more short of breath  than usual. Having less energy for daily activities than usual. Phlegm or mucus that is thicker than usual. Needing to use your rescue inhaler or nebulizer more often than usual. More ankle swelling than usual. Coughing more than usual. Feeling like you have a chest cold. Trouble sleeping due to COPD  symptoms. Decreased appetite. COPD medicines not helping as much as usual. If you experience any "yellow" symptoms: Keep taking your daily medicines as directed. Use your quick-relief inhaler as told by your health care provider. If you were prescribed steroid medicine to take by mouth (oral medicine), start taking it as told by your health care provider. If you were prescribed an antibiotic medicine, start taking it as told by your health care provider. Do not stop taking the antibiotic even if you start to feel better. Use oxygen as told by your health care provider. Get more rest. Do your pursed-lip breathing exercises. Do not smoke. Avoid any irritants in the air. If your signs and symptoms do not improve after taking these steps, call your health care provider right away. Green zone Symptoms in this zone mean that you are doing well. They include: Being able to do your usual activities and exercise. Having the usual amount of coughing, including the same amount of phlegm or mucus. Being able to sleep well. Having a good appetite. Where to find more information: You can find more information about COPD from: American Lung Association, My COPD Action Plan: www.lung.org COPD Foundation: www.copdfoundation.[place]: https://wilson-eaton.com/ Follow these instructions at home: Continue taking your daily medicines as told by your health care provider. Make sure you receive all the immunizations that your health care provider recommends, especially the pneumococcal and influenza vaccines. Wash your hands often with soap and water. Have family members wash their hands too. Regular hand washing can help prevent infections. Follow your usual exercise and diet plan. Avoid irritants in the air, such as smoke. Do not use any products that contain nicotine or tobacco. These products include cigarettes, chewing tobacco, and vaping devices, such as e-cigarettes. If you  need help quitting, ask your health care provider. Summary A COPD action plan tells you what to do when you have a flare (exacerbation) of chronic obstructive pulmonary disease (COPD). Follow each action plan for your symptoms. If you have any symptoms in the red zone, call emergency services (911 in the [place] or go to the nearest emergency room. This information is not intended to replace advice given to you by your health care provider. Make sure you discuss any questions you have with your health care provider. Document Revised: 11/23/2019 Document Reviewed: 11/23/2019 Elsevier Patient Education  [place]. Low-Sodium Eating Plan Sodium, which is an element that makes up salt, helps you maintain a healthy balance of fluids in your body. Too much sodium can increase your blood pressure and cause fluid and waste to be held in your body. Your health care provider or dietitian may recommend following this plan if you have high blood pressure (hypertension), kidney disease, liver disease, or heart failure. Eating less sodium can help lower your blood pressure, reduce swelling, and protect your heart, liver, and kidneys. What are tips for following this plan? Reading food labels The Nutrition Facts label lists the amount of sodium in one serving of the food. If you eat more than one serving, you must multiply the listed amount of sodium by the number of servings. Choose foods with less than 140 mg of sodium  per serving. Avoid foods with 300 mg of sodium or more per serving. Shopping  Look for lower-sodium products, often labeled as "low-sodium" or "no salt added." Always check the sodium content, even if foods are labeled as "unsalted" or "no salt added." Buy fresh foods. Avoid canned foods and pre-made or frozen meals. Avoid canned, cured, or processed meats. Buy breads that have less than 80 mg of sodium per slice. Cooking  Eat more home-cooked food and less restaurant, buffet, and  fast food. Avoid adding salt when cooking. Use salt-free seasonings or herbs instead of table salt or sea salt. Check with your health care provider or pharmacist before using salt substitutes. Cook with plant-based oils, such as canola, sunflower, or olive oil. Meal planning When eating at a restaurant, ask that your food be prepared with less salt or no salt, if possible. Avoid dishes labeled as brined, pickled, cured, smoked, or made with soy sauce, miso, or teriyaki sauce. Avoid foods that contain MSG (monosodium glutamate). MSG is sometimes added to [place] food, bouillon, and some canned foods. Make meals that can be grilled, baked, poached, roasted, or steamed. These are generally made with less sodium. General information Most people on this plan should limit their sodium intake to 1,500-2,000 mg (milligrams) of sodium each day. What foods should I eat? Fruits Fresh, frozen, or canned fruit. Fruit juice. Vegetables Fresh or frozen vegetables. "No salt added" canned vegetables. "No salt added" tomato sauce and paste. Low-sodium or reduced-sodium tomato and vegetable juice. Grains Low-sodium cereals, including oats, puffed wheat and rice, and shredded wheat. Low-sodium crackers. Unsalted rice. Unsalted pasta. Low-sodium bread. Whole-grain breads and whole-grain pasta. Meats and other proteins Fresh or frozen (no salt added) meat, poultry, seafood, and fish. Low-sodium canned tuna and salmon. Unsalted nuts. Dried peas, beans, and lentils without added salt. Unsalted canned beans. Eggs. Unsalted nut butters. Dairy Milk. Soy milk. Cheese that is naturally low in sodium, such as ricotta cheese, fresh mozzarella, or Swiss cheese. Low-sodium or reduced-sodium cheese. Cream cheese. Yogurt. Seasonings and condiments Fresh and dried herbs and spices. Salt-free seasonings. Low-sodium mustard and ketchup. Sodium-free salad dressing. Sodium-free light mayonnaise. Fresh or refrigerated horseradish.  Lemon juice. Vinegar. Other foods Homemade, reduced-sodium, or low-sodium soups. Unsalted popcorn and pretzels. Low-salt or salt-free chips. The items listed above may not be a complete list of foods and beverages you can eat. Contact a dietitian for more information. What foods should I avoid? Vegetables Sauerkraut, pickled vegetables, and relishes. Olives. [place] fries. Onion rings. Regular canned vegetables (not low-sodium or reduced-sodium). Regular canned tomato sauce and paste (not low-sodium or reduced-sodium). Regular tomato and vegetable juice (not low-sodium or reduced-sodium). Frozen vegetables in sauces. Grains Instant hot cereals. Bread stuffing, pancake, and biscuit mixes. Croutons. Seasoned rice or pasta mixes. Noodle soup cups. Boxed or frozen macaroni and cheese. Regular salted crackers. Self-rising flour. Meats and other proteins Meat or fish that is salted, canned, smoked, spiced, or pickled. Precooked or cured meat, such as sausages or meat loaves. Berniece Salines. Ham. Pepperoni. Hot dogs. Corned beef. Chipped beef. Salt pork. Jerky. Pickled herring. Anchovies and sardines. Regular canned tuna. Salted nuts. Dairy Processed cheese and cheese spreads. Hard cheeses. Cheese curds. Blue cheese. Feta cheese. String cheese. Regular cottage cheese. Buttermilk. Canned milk. Fats and oils Salted butter. Regular margarine. Ghee. Bacon fat. Seasonings and condiments Onion salt, garlic salt, seasoned salt, table salt, and sea salt. Canned and packaged gravies. Worcestershire sauce. Tartar sauce. Barbecue sauce. Teriyaki sauce. Soy sauce, including reduced-sodium. Steak  sauce. Fish sauce. Oyster sauce. Cocktail sauce. Horseradish that you find on the shelf. Regular ketchup and mustard. Meat flavorings and tenderizers. Bouillon cubes. Hot sauce. Pre-made or packaged marinades. Pre-made or packaged taco seasonings. Relishes. Regular salad dressings. Salsa. Other foods Salted popcorn and pretzels. Corn  chips and puffs. Potato and tortilla chips. Canned or dried soups. Pizza. Frozen entrees and pot pies. The items listed above may not be a complete list of foods and beverages you should avoid. Contact a dietitian for more information. Summary Eating less sodium can help lower your blood pressure, reduce swelling, and protect your heart, liver, and kidneys. Most people on this plan should limit their sodium intake to 1,500-2,000 mg (milligrams) of sodium each day. Canned, boxed, and frozen foods are high in sodium. Restaurant foods, fast foods, and pizza are also very high in sodium. You also get sodium by adding salt to food. Try to cook at home, eat more fresh fruits and vegetables, and eat less fast food and canned, processed, or prepared foods. This information is not intended to replace advice given to you by your health care provider. Make sure you discuss any questions you have with your health care provider. Document Revised: 02/19/2019 Document Reviewed: 12/16/2018 Elsevier Patient Education  [place].

## 2021-12-27 ENCOUNTER — Other Ambulatory Visit: Payer: Self-pay | Admitting: Physical Medicine and Rehabilitation

## 2021-12-27 DIAGNOSIS — F17210 Nicotine dependence, cigarettes, uncomplicated: Secondary | ICD-10-CM | POA: Diagnosis not present

## 2021-12-27 DIAGNOSIS — I10 Essential (primary) hypertension: Secondary | ICD-10-CM

## 2021-12-27 DIAGNOSIS — J449 Chronic obstructive pulmonary disease, unspecified: Secondary | ICD-10-CM

## 2021-12-27 NOTE — Telephone Encounter (Signed)
PMP was Reviewed.  Dr Dagoberto Ligas note was Reviewed,  Methadone e-scribed today.

## 2021-12-31 ENCOUNTER — Telehealth: Payer: Self-pay | Admitting: *Deleted

## 2021-12-31 ENCOUNTER — Encounter: Payer: Self-pay | Admitting: *Deleted

## 2021-12-31 NOTE — Telephone Encounter (Signed)
Received call from pt asking for a letter stating that she has a terminal cancer diagnosis and that she continues with chemo treatments every 3 weeks. Pt states this is for the [place]. She states they are at risk of losing their house. Pt to pick up letter on Wednesday at her next appt.

## 2022-01-01 ENCOUNTER — Other Ambulatory Visit: Payer: Self-pay | Admitting: Nurse Practitioner

## 2022-01-01 NOTE — Telephone Encounter (Signed)
Please advise 

## 2022-01-02 ENCOUNTER — Inpatient Hospital Stay: Payer: Medicare Other

## 2022-01-02 ENCOUNTER — Inpatient Hospital Stay: Payer: Medicare Other | Attending: Hematology and Oncology | Admitting: Hematology and Oncology

## 2022-01-02 VITALS — BP 140/70 | HR 94 | Temp 98.1°F | Resp 16 | Wt 195.1 lb

## 2022-01-02 DIAGNOSIS — Z90721 Acquired absence of ovaries, unilateral: Secondary | ICD-10-CM | POA: Insufficient documentation

## 2022-01-02 DIAGNOSIS — J449 Chronic obstructive pulmonary disease, unspecified: Secondary | ICD-10-CM | POA: Diagnosis not present

## 2022-01-02 DIAGNOSIS — Z882 Allergy status to sulfonamides status: Secondary | ICD-10-CM | POA: Diagnosis not present

## 2022-01-02 DIAGNOSIS — G629 Polyneuropathy, unspecified: Secondary | ICD-10-CM | POA: Diagnosis not present

## 2022-01-02 DIAGNOSIS — Z888 Allergy status to other drugs, medicaments and biological substances status: Secondary | ICD-10-CM | POA: Insufficient documentation

## 2022-01-02 DIAGNOSIS — F32A Depression, unspecified: Secondary | ICD-10-CM | POA: Diagnosis not present

## 2022-01-02 DIAGNOSIS — Z886 Allergy status to analgesic agent status: Secondary | ICD-10-CM | POA: Diagnosis not present

## 2022-01-02 DIAGNOSIS — E785 Hyperlipidemia, unspecified: Secondary | ICD-10-CM | POA: Insufficient documentation

## 2022-01-02 DIAGNOSIS — C3490 Malignant neoplasm of unspecified part of unspecified bronchus or lung: Secondary | ICD-10-CM | POA: Diagnosis not present

## 2022-01-02 DIAGNOSIS — R110 Nausea: Secondary | ICD-10-CM | POA: Insufficient documentation

## 2022-01-02 DIAGNOSIS — E876 Hypokalemia: Secondary | ICD-10-CM | POA: Diagnosis not present

## 2022-01-02 DIAGNOSIS — Z9049 Acquired absence of other specified parts of digestive tract: Secondary | ICD-10-CM | POA: Insufficient documentation

## 2022-01-02 DIAGNOSIS — Z8719 Personal history of other diseases of the digestive system: Secondary | ICD-10-CM | POA: Diagnosis not present

## 2022-01-02 DIAGNOSIS — Z79899 Other long term (current) drug therapy: Secondary | ICD-10-CM | POA: Insufficient documentation

## 2022-01-02 DIAGNOSIS — Z880 Allergy status to penicillin: Secondary | ICD-10-CM | POA: Diagnosis not present

## 2022-01-02 DIAGNOSIS — Z809 Family history of malignant neoplasm, unspecified: Secondary | ICD-10-CM | POA: Insufficient documentation

## 2022-01-02 DIAGNOSIS — G8929 Other chronic pain: Secondary | ICD-10-CM | POA: Diagnosis not present

## 2022-01-02 DIAGNOSIS — J029 Acute pharyngitis, unspecified: Secondary | ICD-10-CM | POA: Diagnosis not present

## 2022-01-02 DIAGNOSIS — Z814 Family history of other substance abuse and dependence: Secondary | ICD-10-CM | POA: Insufficient documentation

## 2022-01-02 DIAGNOSIS — Z8249 Family history of ischemic heart disease and other diseases of the circulatory system: Secondary | ICD-10-CM | POA: Insufficient documentation

## 2022-01-02 DIAGNOSIS — R600 Localized edema: Secondary | ICD-10-CM | POA: Insufficient documentation

## 2022-01-02 DIAGNOSIS — Z5112 Encounter for antineoplastic immunotherapy: Secondary | ICD-10-CM | POA: Insufficient documentation

## 2022-01-02 DIAGNOSIS — Z820 Family history of epilepsy and other diseases of the nervous system: Secondary | ICD-10-CM | POA: Insufficient documentation

## 2022-01-02 DIAGNOSIS — I10 Essential (primary) hypertension: Secondary | ICD-10-CM | POA: Diagnosis not present

## 2022-01-02 DIAGNOSIS — C3431 Malignant neoplasm of lower lobe, right bronchus or lung: Secondary | ICD-10-CM | POA: Diagnosis not present

## 2022-01-02 DIAGNOSIS — Z95828 Presence of other vascular implants and grafts: Secondary | ICD-10-CM | POA: Diagnosis not present

## 2022-01-02 DIAGNOSIS — R197 Diarrhea, unspecified: Secondary | ICD-10-CM | POA: Insufficient documentation

## 2022-01-02 DIAGNOSIS — E119 Type 2 diabetes mellitus without complications: Secondary | ICD-10-CM | POA: Insufficient documentation

## 2022-01-02 DIAGNOSIS — Z825 Family history of asthma and other chronic lower respiratory diseases: Secondary | ICD-10-CM | POA: Insufficient documentation

## 2022-01-02 DIAGNOSIS — Z823 Family history of stroke: Secondary | ICD-10-CM | POA: Insufficient documentation

## 2022-01-02 DIAGNOSIS — Z885 Allergy status to narcotic agent status: Secondary | ICD-10-CM | POA: Insufficient documentation

## 2022-01-02 DIAGNOSIS — Z8261 Family history of arthritis: Secondary | ICD-10-CM | POA: Insufficient documentation

## 2022-01-02 DIAGNOSIS — F17210 Nicotine dependence, cigarettes, uncomplicated: Secondary | ICD-10-CM | POA: Diagnosis not present

## 2022-01-02 LAB — CMP (CANCER CENTER ONLY)
ALT: 6 U/L (ref 0–44)
AST: 13 U/L — ABNORMAL LOW (ref 15–41)
Albumin: 3.6 g/dL (ref 3.5–5.0)
Alkaline Phosphatase: 89 U/L (ref 38–126)
Anion gap: 6 (ref 5–15)
BUN: 12 mg/dL (ref 8–23)
CO2: 28 mmol/L (ref 22–32)
Calcium: 9.1 mg/dL (ref 8.9–10.3)
Chloride: 102 mmol/L (ref 98–111)
Creatinine: 1.07 mg/dL — ABNORMAL HIGH (ref 0.44–1.00)
GFR, Estimated: 58 mL/min — ABNORMAL LOW (ref >60)
Glucose, Bld: 100 mg/dL — ABNORMAL HIGH (ref 70–99)
Potassium: 3.8 mmol/L (ref 3.5–5.1)
Sodium: 136 mmol/L (ref 135–145)
Total Bilirubin: 0.5 mg/dL (ref 0.3–1.2)
Total Protein: 6.4 g/dL — ABNORMAL LOW (ref 6.5–8.1)

## 2022-01-02 LAB — CBC WITH DIFFERENTIAL (CANCER CENTER ONLY)
Abs Immature Granulocytes: 0.03 10*3/uL (ref 0.00–0.07)
Basophils Absolute: 0 10*3/uL (ref 0.0–0.1)
Basophils Relative: 0 %
Eosinophils Absolute: 0.7 10*3/uL — ABNORMAL HIGH (ref 0.0–0.5)
Eosinophils Relative: 7 %
HCT: 38 % (ref 36.0–46.0)
Hemoglobin: 12.2 g/dL (ref 12.0–15.0)
Immature Granulocytes: 0 %
Lymphocytes Relative: 23 %
Lymphs Abs: 2.2 10*3/uL (ref 0.7–4.0)
MCH: 28.2 pg (ref 26.0–34.0)
MCHC: 32.1 g/dL (ref 30.0–36.0)
MCV: 87.8 fL (ref 80.0–100.0)
Monocytes Absolute: 0.7 10*3/uL (ref 0.1–1.0)
Monocytes Relative: 7 %
Neutro Abs: 5.8 10*3/uL (ref 1.7–7.7)
Neutrophils Relative %: 63 %
Platelet Count: 191 10*3/uL (ref 150–400)
RBC: 4.33 MIL/uL (ref 3.87–5.11)
RDW: 20.7 % — ABNORMAL HIGH (ref 11.5–15.5)
WBC Count: 9.5 10*3/uL (ref 4.0–10.5)
nRBC: 0 % (ref 0.0–0.2)

## 2022-01-02 LAB — TSH: TSH: 1.061 u[IU]/mL (ref 0.350–4.500)

## 2022-01-02 MED ORDER — ONDANSETRON HCL 4 MG/2ML IJ SOLN
8.0000 mg | Freq: Once | INTRAMUSCULAR | Status: AC
  Administered 2022-01-02: 8 mg via INTRAVENOUS
  Filled 2022-01-02: qty 4

## 2022-01-02 MED ORDER — SODIUM CHLORIDE 0.9% FLUSH
10.0000 mL | INTRAVENOUS | Status: DC | PRN
  Administered 2022-01-02: 10 mL

## 2022-01-02 MED ORDER — SODIUM CHLORIDE 0.9 % IV SOLN
1200.0000 mg | Freq: Once | INTRAVENOUS | Status: AC
  Administered 2022-01-02: 1200 mg via INTRAVENOUS
  Filled 2022-01-02: qty 20

## 2022-01-02 MED ORDER — SODIUM CHLORIDE 0.9% FLUSH
10.0000 mL | Freq: Once | INTRAVENOUS | Status: AC
  Administered 2022-01-02: 10 mL

## 2022-01-02 MED ORDER — HEPARIN SOD (PORK) LOCK FLUSH 100 UNIT/ML IV SOLN
500.0000 [IU] | Freq: Once | INTRAVENOUS | Status: AC | PRN
  Administered 2022-01-02: 500 [IU]

## 2022-01-02 MED ORDER — SODIUM CHLORIDE 0.9 % IV SOLN: Freq: Once | INTRAVENOUS | Status: AC

## 2022-01-02 NOTE — Patient Instructions (Signed)
[facility]  Discharge Instructions: Thank you for choosing [facility] to provide your oncology and hematology care.   If you have a lab appointment with the [facility], please go directly to the [facility] and check in at the registration area.   Wear comfortable clothing and clothing appropriate for easy access to any Portacath or PICC line.   We strive to give you quality time with your provider. You may need to reschedule your appointment if you arrive late (15 or more minutes).  Arriving late affects you and other patients whose appointments are after yours.  Also, if you miss three or more appointments without notifying the office, you may be dismissed from the clinic at the provider's discretion.      For prescription refill requests, have your pharmacy contact our office and allow 72 hours for refills to be completed.    Today you received the following chemotherapy and/or immunotherapy agents: Atezolizumab      To help prevent nausea and vomiting after your treatment, we encourage you to take your nausea medication as directed.  BELOW ARE SYMPTOMS THAT SHOULD BE REPORTED IMMEDIATELY: *FEVER GREATER THAN 100.4 F (38 C) OR HIGHER *CHILLS OR SWEATING *NAUSEA AND VOMITING THAT IS NOT CONTROLLED WITH YOUR NAUSEA MEDICATION *UNUSUAL SHORTNESS OF BREATH *UNUSUAL BRUISING OR BLEEDING *URINARY PROBLEMS (pain or burning when urinating, or frequent urination) *BOWEL PROBLEMS (unusual diarrhea, constipation, pain near the anus) TENDERNESS IN MOUTH AND THROAT WITH OR WITHOUT PRESENCE OF ULCERS (sore throat, sores in mouth, or a toothache) UNUSUAL RASH, SWELLING OR PAIN  UNUSUAL VAGINAL DISCHARGE OR ITCHING   Items with * indicate a potential emergency and should be followed up as soon as possible or go to the Emergency Department if any problems should occur.  Please show the CHEMOTHERAPY ALERT CARD or IMMUNOTHERAPY ALERT CARD at check-in  to the Emergency Department and triage nurse.  Should you have questions after your visit or need to cancel or reschedule your appointment, please contact [facility]  Dept: [phone]  and follow the prompts.  Office hours are 8:00 a.m. to 4:30 p.m. Monday - Friday. Please note that voicemails left after 4:00 p.m. may not be returned until the following business day.  We are closed weekends and major holidays. You have access to a nurse at all times for urgent questions. Please call the main number to the clinic Dept: [phone] and follow the prompts.   For any non-urgent questions, you may also contact your provider using MyChart. We now offer e-Visits for anyone 39 and older to request care online for non-urgent symptoms. For details visit [email or web address].   Also download the MyChart app! Go to the app store, search "MyChart", open the app, select [facility], and log in with your MyChart username and password.  Masks are optional in the cancer centers. If you would like for your care team to wear a mask while they are taking care of you, please let them know. You may have one support person who is at least 65 years old accompany you for your appointments.

## 2022-01-02 NOTE — Progress Notes (Signed)
[place] [phone]   [phone]  PROGRESS NOTE  Patient Care Team: Allwardt, Randa Evens, PA-C as PCP - General (Physician Assistant) Milus Banister, MD as Attending Physician (Gastroenterology) Dr. Darylene Price, MD as Consulting Physician (Orthopedic Surgery) Latanya Maudlin, MD as Consulting Physician (Orthopedic Surgery) Kathrynn Ducking, MD (Inactive) as Consulting Physician (Neurology) Okey Regal, [place] as Consulting Physician (Optometry) Delice Lesch Lezlie Octave, MD as Consulting Physician (Neurology) Edythe Clarity, [facility] (Pharmacist) Kassie Mends, RN as [place] History # Small Cell Lung Cancer, Extensive Stage 07/05/2020: CT abdomen for lower abdominal pain. New pulmonary nodular density noted 07/06/2020: CT chest showed 2.2 cm macrolobulated right lower lobe pulmonary nodule (favored) versus pathologically enlarged infrahilar lymph node 10/13/2020: PET CT scan performed, findings show 2 cm right lower lobe lung mass is hypermetabolic and consistent with primary lung neoplasm. Additionally found hypermetabolic 17 mm soft tissue lesion between the descending duodenum and the pancreatic head  10/19/2020: EGD to biopsy hypermetabolic lymph node. Biopsy results show small cell lung cancer 10/26/2020: establish care with Dr. Lorenso Courier  11/13/2020: Cycle 1 Day 1 of Carbo/Etop/Atezolizumab 12/04/2020: Cycle 2 Day 1 of Carbo/Etop/Atezolizumab 11/22-11/25/2022: admitted for E. Coli bacteremia/sepsis. Start of Cycle 3 delayed. 01/02/2021: Cycle 3 Day 1 of Carbo/Etop/Atezolizumab 01/23/2021: Cycle 4 Day 1 of Carbo/Etop/Atezolizumab 02/22/2021: Cycle 5 Day 1 of Atezolizumab Maintenance. Delayed start due to patient's COVID infection.  03/14/2021: Cycle 6 Day 1 of Atezolizumab Maintenance 04/05/2021: Cycle 7 Day 1 of Atezolizumab Maintenance 04/25/2021: Cycle 8 Day 1 of Atezolizumab  Maintenance 05/16/2021: Cycle 9 Day 1 of Atezolizumab Maintenance 06/06/2021: Cycle 10 Day 1 of Atezolizumab Maintenance 06/27/2021:  Cycle 11 Day 1 of Atezolizumab Maintenance 07/18/2021: Cycle 12 Day 1 of Atezolizumab Maintenance 08/08/2021: Cycle 13 Day 1 of Atezolizumab Maintenance 08/29/2021: Cycle 14 Day 1 of Atezolizumab Maintenance 09/19/2021: Cycle 15 Day 1 of Atezolizumab Maintenance 10/10/2021: Cycle 16 Day 1 of Atezolizumab Maintenance 10/31/2021: Cycle 17 Day 1 of Atezolizumab Maintenance 11/21/2021: Cycle 18 Day 1 of Atezolizumab Maintenance 12/12/2021: Cycle 19 Day 1 of Atezolizumab Maintenance 01/02/2022: Cycle 20 Day 1 of Atezolizumab Maintenance  Interval History:  [name] 66 y.o. female with medical history significant for extensive stage small cell lung cancer who presents for a follow up visit. The patient's last visit was on 12/12/2021. In the interim since the last visit she has completed cycle 19 of chemotherapy. She presents today to start cycle 20 of chemotherapy.  On exam today [name] reports she is tolerating her immunotherapy well without any major side effects.  She reports that last month her last 2 cycles went well and she was her "regular corny self".  She reports that this last 1 was quite hard but there were other factors that made it difficult.  She did have "3 teeth cut out after last treatment".  She notes that she has been chewing and eating okay with his teeth removed.  She reports that she is taking fluid pill and decreasing the swelling in her legs.  She did have diarrhea all weekend and has lost about 7 pounds in the interim since her last visit.  She reports that she is still feeling quite fatigued and does occasionally have whole body itching, predominantly on the head and under the breasts.  She denies fevers, chills, night sweats, chest pain or cough.She has no other complaints. A full 10 point ROS is listed below.  Overall she is willing  and able  to proceed with treatment today.   MEDICAL HISTORY:  Past Medical History:  Diagnosis Date   Allergic rhinitis    Anemia    Anxiety    Chicken pox    Chronic back pain    COPD (chronic obstructive pulmonary disease) (HCC)    Depression    DM (diabetes mellitus) ([place])    Essential hypertension    GERD (gastroesophageal reflux disease)    Headache    migraines   History of gastritis    EGD 2015   History of home oxygen therapy    2 liters at hs last 6 months   Hyperlipidemia    Hypothyroidism    Migraines    Osteoarthritis    oa   Scoliosis     SURGICAL HISTORY: Past Surgical History:  Procedure Laterality Date   APPENDECTOMY     1985   BIOPSY  07/24/2017   Procedure: BIOPSY;  Surgeon: Milus Banister, MD;  Location: [facility];  Service: Endoscopy;;   CARDIAC CATHETERIZATION N/A 10/31/2015   Procedure: Left Heart Cath and Coronary Angiography;  Surgeon: Leonie Man, MD;  Location: [facility];  Service: Cardiovascular;  Laterality: N/A;   CARPAL TUNNEL RELEASE Left    CARPAL TUNNEL RELEASE Right    CHOLECYSTECTOMY  late 1980's   COLONOSCOPY WITH PROPOFOL N/A 07/24/2017   Procedure: COLONOSCOPY WITH PROPOFOL;  Surgeon: Milus Banister, MD;  [facility]: [facility];  Service: Endoscopy;  Laterality: N/A;   ESOPHAGOGASTRODUODENOSCOPY N/A 07/24/2017   Procedure: ESOPHAGOGASTRODUODENOSCOPY (EGD);  Surgeon: Milus Banister, MD;  Location: [facility];  Service: Endoscopy;  Laterality: N/A;   ESOPHAGOGASTRODUODENOSCOPY (EGD) WITH PROPOFOL N/A 10/19/2020   Procedure: ESOPHAGOGASTRODUODENOSCOPY (EGD) WITH PROPOFOL;  Surgeon: Milus Banister, MD;  [facility]: [facility];  Service: Endoscopy;  Laterality: N/A;   FINE NEEDLE ASPIRATION N/A 10/19/2020   Procedure: FINE NEEDLE ASPIRATION (FNA) LINEAR;  Surgeon: Milus Banister, MD;  [facility]: [facility];  Service: Endoscopy;  Laterality: N/A;   GALLBLADDER SURGERY  1991   HIP CLOSED REDUCTION Right 01/08/2016    Procedure: CLOSED MANIPULATION HIP;  Surgeon: Susa Day, MD;  [facility]: [facility];  Service: Orthopedics;  Laterality: Right;   HIP CLOSED REDUCTION Right 01/19/2016   Procedure: ATTEMPTED CLOSED REDUCTION RIGHT HIP;  Surgeon: Wylene Simmer, MD;  [facility]: [facility];  Service: Orthopedics;  Laterality: Right;   HIP CLOSED REDUCTION Right 01/20/2016   Procedure: CLOSED REDUCTION RIGHT TOTAL HIP;  Surgeon: Paralee Cancel, MD;  [facility];  Service: Orthopedics;  Laterality: Right;   HIP CLOSED REDUCTION Right 02/17/2016   Procedure: CLOSED REDUCTION RIGHT TOTAL HIP;  Surgeon: Rod Can, MD;  Location: [facility];  Service: Orthopedics;  Laterality: Right;   HIP CLOSED REDUCTION Right 02/28/2016   Procedure: CLOSED REDUCTION HIP;  Surgeon: Nicholes Stairs, MD;  [facility];  Service: Orthopedics;  Laterality: Right;   IR IMAGING GUIDED PORT INSERTION  11/01/2020   POLYPECTOMY  07/24/2017   Procedure: POLYPECTOMY;  Surgeon: Milus Banister, MD;  [facility];  Service: Endoscopy;;   [place], with 1 ovary removed and 2 nd ovary removed 2003   TOTAL HIP ARTHROPLASTY Right    Original surgery 2006 with revision 2010   TOTAL HIP REVISION Right 01/01/2016   Procedure: TOTAL HIP REVISION;  Surgeon: Paralee Cancel, MD;  [facility]: [facility];  Service: Orthopedics;  Laterality: Right;   TOTAL  HIP REVISION Right 03/02/2016   Procedure: TOTAL HIP REVISION;  Surgeon: Paralee Cancel, MD;  [facility];  Service: Orthopedics;  Laterality: Right;   TOTAL HIP REVISION Right 09/02/2016   Procedure: Right hip constrained liner- posterior;  Surgeon: Paralee Cancel, MD;  Location: [facility];  Service: Orthopedics;  Laterality: Right;   ULNAR NERVE TRANSPOSITION Right    UPPER ESOPHAGEAL ENDOSCOPIC ULTRASOUND (EUS) N/A 10/19/2020   Procedure: UPPER ESOPHAGEAL ENDOSCOPIC ULTRASOUND (EUS);  Surgeon: Milus Banister, MD;  Location: [facility];  Service: Endoscopy;   Laterality: N/A;  periduodenal lesion    SOCIAL HISTORY: Social History   Socioeconomic History   Marital status: Married    Spouse name: Not on file   Number of children: 2   Years of education: Not on file   Highest education level: Not on file  Occupational History   Occupation: disabled   Occupation: disabled  Tobacco Use   Smoking status: Every Day    Packs/day: 2.00    Years: 46.00    Total pack years: 92.00    Types: Cigarettes   Smokeless tobacco: Never   Tobacco comments:    2 packs of cigarettes smoked daily 12/13/21- declines smoking cessation  Vaping Use   Vaping Use: Never used  Substance and Sexual Activity   Alcohol use: No   Drug use: No   Sexual activity: Not Currently    Partners: Male  Other Topics Concern   Not on file  Social History Narrative   Right handed    Caffeine~ 2 cups per day    Lives at home with husband (strained relationship)   Primary caretaker for disabled brother who had aneurism   Daughter died June 30, 2018    Social Determinants of Health   Financial Resource Strain: Low Risk  (12/13/2021)   Overall Financial Resource Strain (CARDIA)    Difficulty of Paying Living Expenses: Not very hard  Food Insecurity: No Food Insecurity (12/13/2021)   Hunger Vital Sign    Worried About Running Out of Food in the Last Year: Never true    [place] in the Last Year: Never true  Transportation Needs: No Transportation Needs (12/13/2021)   PRAPARE - Hydrologist (Medical): No    Lack of Transportation (Non-Medical): No  Physical Activity: Inactive (12/13/2021)   Exercise Vital Sign    Days of Exercise per Week: 0 days    Minutes of Exercise per Session: 0 min  Stress: Stress Concern Present (12/13/2021)   [place]    Feeling of Stress : To some extent  Social Connections: Moderately Integrated (12/13/2021)   Social Connection and  Isolation Panel [NHANES]    Frequency of Communication with Friends and Family: Twice a week    Frequency of Social Gatherings with Friends and Family: Once a week    Attends Religious Services: 1 to 4 times per year    Active Member of [employer] or Organizations: No    Attends Archivist Meetings: Never    Marital Status: Married  Human resources officer Violence: Not At Risk (05/03/2021)   Humiliation, Afraid, Rape, and Kick questionnaire    Fear of Current or Ex-Partner: No    Emotionally Abused: No    Physically Abused: No    Sexually Abused: No    FAMILY HISTORY: Family History  Problem Relation Age of Onset   COPD Mother    Heart disease Mother  Lung disease Father        Asbestosis   Heart attack Father    Heart disease Father    Cerebral aneurysm Brother    Aneurysm Brother        Brain   Drug abuse Daughter    Epilepsy Son    Alcohol abuse Son    Drug abuse Son    Arthritis Maternal Grandmother    Heart disease Maternal Grandmother    Asthma Maternal Grandfather    Cancer Maternal Grandfather    Arthritis Paternal Grandmother    Heart disease Paternal Grandmother    Stroke Paternal Grandmother    Early death Paternal Grandfather    Heart disease Paternal Grandfather     ALLERGIES:  is allergic to metformin and related, nsaids, wellbutrin [bupropion], aleve [naproxen sodium], codeine, penicillins, and sulfonamide derivatives.  MEDICATIONS:  Current Outpatient Medications  Medication Sig Dispense Refill   albuterol (PROAIR HFA) 108 (90 Base) MCG/ACT inhaler 2 puffs every 4 hours as needed only  if your can't catch your breath 18 g 3   ALPRAZolam (XANAX) 1 MG tablet TAKE 1 TABLET BY MOUTH THREE TIMES DAILY AS NEEDED FOR ANXIETY 90 tablet 2   atorvastatin (LIPITOR) 20 MG tablet TAKE 1 TABLET BY MOUTH AT BEDTIME 90 tablet 1   benzonatate (TESSALON) 100 MG capsule TAKE 1 CAPSULE BY MOUTH THREE TIMES DAILY 30 capsule 1   Budeson-Glycopyrrol-Formoterol (BREZTRI  AEROSPHERE) 160-9-4.8 MCG/ACT AERO Inhale 2 puffs into the lungs in the morning and at bedtime. 10.7 g 3   cyclobenzaprine (FLEXERIL) 10 MG tablet Take 1 tablet (10 mg total) by mouth 3 (three) times daily as needed for muscle spasms. 90 tablet 5   DULoxetine (CYMBALTA) 60 MG capsule TAKE TWO CAPSULES BY MOUTH DAILY 60 capsule 5   fluticasone (FLONASE) 50 MCG/ACT nasal spray INSTILL 2 SPRAYS IN EACH NOSTRIL EVERY DAY 16 g 6   fluticasone-salmeterol (ADVAIR DISKUS) 250-50 MCG/ACT AEPB Inhale 1 puff into the lungs in the morning and at bedtime. (Patient not taking: Reported on 12/13/2021) 60 each 5   furosemide (LASIX) 20 MG tablet TAKE 1 TABLET BY MOUTH TWICE DAILY. If swelling improves can hold medication. 90 tablet 0   HYDROcodone-acetaminophen (NORCO) 10-325 MG tablet TAKE 1 TABLET BY MOUTH EVERY 4 HOURS AS NEEDED max FIVE tablets DAILY 150 tablet 0   levothyroxine (SYNTHROID) 137 MCG tablet Take 1 tablet (137 mcg total) by mouth daily before breakfast. 30 tablet 5   loratadine (CLARITIN) 10 MG tablet Take 10 mg by mouth daily.     meloxicam (MOBIC) 15 MG tablet TAKE 1 TABLET BY MOUTH EVERY DAY 30 tablet 2   methadone (DOLOPHINE) 5 MG tablet TAKE 1 TABLET BY MOUTH EVERY TWELVE HOURS (Patient not taking: Reported on 01/02/2022) 60 tablet 0   OLANZapine (ZYPREXA) 10 MG tablet Take 1 tablet (10 mg total) by mouth at bedtime. 30 tablet 2   omeprazole (PRILOSEC) 40 MG capsule TAKE ONE CAPSULE BY MOUTH TWICE DAILY 60 capsule 0   ondansetron (ZOFRAN) 8 MG tablet TAKE 2 TABLETS BY MOUTH TWICE DAILY AS NEEDED 60 tablet 0   potassium chloride SA (KLOR-CON M) 20 MEQ tablet Take 1 tablet (20 mEq total) by mouth 2 (two) times daily. 60 tablet 2   prochlorperazine (COMPAZINE) 10 MG tablet Take 1 tablet (10 mg total) by mouth every 6 (six) hours as needed for nausea or vomiting. 30 tablet 0   triamterene-hydrochlorothiazide (MAXZIDE-25) 37.5-25 MG tablet TAKE 1 TABLET BY MOUTH  DAILY 90 tablet 2   No current  facility-administered medications for this visit.   Facility-Administered Medications Ordered in Other Visits  Medication Dose Route Frequency Provider Last Rate Last Admin   sodium chloride flush (NS) 0.9 % injection 10 mL  10 mL Intracatheter PRN Orson Slick, MD   10 mL at 01/02/22 1402    REVIEW OF SYSTEMS:   Constitutional: ( - ) fevers, ( - )  chills , ( - ) night sweats Eyes: ( - ) blurriness of vision, ( - ) double vision, ( - ) watery eyes Ears, nose, mouth, throat, and face: ( - ) mucositis, ( - ) sore throat Respiratory: ( - ) cough, ( +) dyspnea, ( - ) wheezes Cardiovascular: ( - ) palpitation, ( - ) chest discomfort, ( + ) lower extremity swelling Gastrointestinal:  ( -) nausea, ( - ) heartburn, ( - ) change in bowel habits Skin: ( - ) abnormal skin rashes Lymphatics: ( - ) new lymphadenopathy, ( - ) easy bruising Neurological: (+ ) numbness, ( - ) tingling, ( - ) new weaknesses Behavioral/Psych: ( - ) mood change, ( - ) new changes  All other systems were reviewed with the patient and are negative.  PHYSICAL EXAMINATION: ECOG PERFORMANCE STATUS: 1 - Symptomatic but completely ambulatory  Vitals:   01/02/22 1129  BP: (!) 140/70  Pulse: 94  Resp: 16  Temp: 98.1 F (36.7 C)  SpO2: 96%   Filed Weights   01/02/22 1129  Weight: 195 lb 1.6 oz (88.5 kg)    GENERAL: Well-appearing middle-age Caucasian female, alert, no distress and comfortable SKIN: skin color, texture, turgor are normal, no rashes or significant lesions EYES: conjunctiva are pink and non-injected, sclera clear LUNGS:  normal breathing effort. Diffuse wheezing hear on ausculation.  HEART: regular rate & rhythm and no murmurs. Mild bilateral lower extremity edema Musculoskeletal: no cyanosis of digits and no clubbing  PSYCH: alert & oriented x 3, fluent speech NEURO: no focal motor/sensory deficits  LABORATORY DATA:  I have reviewed the data as listed    Latest Ref Rng & Units 01/02/2022    10:57 AM 12/12/2021   10:51 AM 11/21/2021    7:54 AM  CBC  WBC 4.0 - 10.5 K/uL 9.5  11.4  8.1   Hemoglobin 12.0 - 15.0 g/dL 12.2  12.1  12.2   Hematocrit 36.0 - 46.0 % 38.0  38.2  38.6   Platelets 150 - 400 K/uL 191  232  185        Latest Ref Rng & Units 01/02/2022   10:57 AM 12/12/2021   10:51 AM 11/21/2021    7:54 AM  CMP  Glucose 70 - 99 mg/dL 100  97  96   BUN 8 - 23 mg/dL 12  17  13    Creatinine 0.44 - 1.00 mg/dL 1.07  1.18  1.12   Sodium 135 - 145 mmol/L 136  136  138   Potassium 3.5 - 5.1 mmol/L 3.8  3.2  3.1   Chloride 98 - 111 mmol/L 102  97  98   CO2 22 - 32 mmol/L 28  32  34   Calcium 8.9 - 10.3 mg/dL 9.1  9.0  8.6   Total Protein 6.5 - 8.1 g/dL 6.4  6.7  6.2   Total Bilirubin 0.3 - 1.2 mg/dL 0.5  0.6  0.5   Alkaline Phos 38 - 126 U/L 89  98  95   AST 15 -  41 U/L 13  12  13    ALT 0 - 44 U/L 6  6  11      No results found for: "MPROTEIN" Lab Results  Component Value Date   KPAFRELGTCHN 0.75 06/30/2014   LAMBDASER 3.78 (H) 06/30/2014   KAPLAMBRATIO 0.20 (L) 06/30/2014     RADIOGRAPHIC STUDIES: No results found.  ASSESSMENT & PLAN [name] 66 y.o. female with medical history significant for extensive stage small cell lung cancer who presents for a follow up visit.   After review of the labs, review of the records, and discussion with the patient the patients findings are most consistent with extensive stage small cell lung cancer with metastasis from the right lower lobe to the lymph nodes of the abdomen.  At this time we will pursue triple therapy with carboplatin, etoposide, and atezolizumab.  After 4 cycles we will convert to maintenance atezolizumab alone.  We previously discussed the risks and benefits of this therapy and the patient was in agreement to proceed with this treatment.  The treatment of choice consist of carboplatin, etoposide, and atezolizumab.  The regimen consists of carboplatin AUC of 5 IV on day 1, etoposide 100 mg per metered  squared IV on day 1, 2, and 3 and atezolizumab 1200 mg on day 1.  This continues for 21-day cycles.  After 4 cycles the patient proceeds with atezolizumab maintenance therapy alone.   #Small Cell Lung Cancer, Extensive Stage -- MRI of the brain shows no evidence of intracranial spread --Findings are currently consistent with metastatic small cell lung cancer with metastatic spread to the lymph nodes of the abdomen --Prognosis is poor with anticipated life span of less than 12 months -- Patient has extensive stage due to metastatic spread to lymph nodes in the abdomen --Plan to proceed with carboplatin, etoposide, and atezolizumab --will consider prophylactic cranial radiation after start of chemotherapy.  Plan: --today is Cycle 20 Day 1 of Atezolizumab maintenance --labs today were reviewed and adequate for treatment. Hgb 12.2, MCV 87.8, white blood cell count 9.5, and platelets of 232.  Creatinine 1.07 with normal AST and ALT --CT scan q 3 months. Last scan in Sept 2023 showed continued excellent response to therapy. CT scan next in Dec 2023.  --RTC in 3 weeks for next cycle of maintenance atezolizumab   #Supportive Care -- chemotherapy education complete -- port placed -- zofran 8mg  q8H PRN and compazine 10mg  PO q6H for nausea -- EMLA cream for port -- Will add mirtazapine in order to help with appetite/sleep  #Nausea: --Symptoms improved with zofran and olanzapine.  --Added IV zofran to infusion premeds for better symptom control.   #Hypokalemia:  --currently taking potassium chloride 20 mEq PO daily, increase to 20 meq BID --potassium level is still 3.8 today. Continue PO supplementation.   #Lower extremity edema-worsening --Currently takes lasix 20 mg daily, recommending increasing to q 6 hours x 2 doses daily.  --Improving. Continue to monitor.   #Neuropathy involving fingers/feet: --Patient currently takes Cymbalta   Orders Placed This Encounter  Procedures   CT CHEST  ABDOMEN PELVIS W CONTRAST    Standing Status:   Future    Standing Expiration Date:   01/03/2023    Order Specific Question:   If indicated for the ordered procedure, I authorize the administration of contrast media per Radiology protocol    Answer:   Yes    Order Specific Question:   Does the patient have a contrast media/X-ray dye allergy?  Answer:   No    Order Specific Question:   Preferred imaging location?    Answer:   [facility]    Order Specific Question:   Is Oral Contrast requested for this exam?    Answer:   Yes, Per Radiology protocol   TSH    Standing Status:   Future    Standing Expiration Date:   03/06/2023   T4    Standing Status:   Future    Standing Expiration Date:   03/06/2023   CBC with Differential ([place] Only)    Standing Status:   Future    Standing Expiration Date:   03/07/2023   CMP ([place] only)    Standing Status:   Future    Standing Expiration Date:   03/07/2023   All questions were answered. The patient knows to call the clinic with any problems, questions or concerns.  I have spent a total of 30 minutes minutes of face-to-face and non-face-to-face time, preparing to see the patient, performing a medically appropriate examination, counseling and educating the patient, ordering medications/tests, documenting clinical information in the electronic health record, and care coordination.   Ledell Peoples, MD Department of Hematology/Oncology [place] [facility] Phone: [phone] Pager: [phone] Email: [email or web address]  01/02/2022 4:05 PM

## 2022-01-03 ENCOUNTER — Other Ambulatory Visit: Payer: Self-pay | Admitting: Hematology and Oncology

## 2022-01-03 MED ORDER — ONDANSETRON HCL 8 MG PO TABS: 8.0000 mg | ORAL_TABLET | Freq: Two times a day (BID) | ORAL | 1 refills | Status: DC

## 2022-01-04 ENCOUNTER — Other Ambulatory Visit: Payer: Self-pay | Admitting: Physical Medicine and Rehabilitation

## 2022-01-04 DIAGNOSIS — G894 Chronic pain syndrome: Secondary | ICD-10-CM

## 2022-01-04 LAB — T4: T4, Total: 9.6 ug/dL (ref 4.5–12.0)

## 2022-01-07 ENCOUNTER — Other Ambulatory Visit: Payer: Self-pay | Admitting: Physical Medicine and Rehabilitation

## 2022-01-07 ENCOUNTER — Telehealth: Payer: Self-pay | Admitting: Hematology and Oncology

## 2022-01-07 ENCOUNTER — Encounter: Payer: Self-pay | Admitting: Physical Medicine and Rehabilitation

## 2022-01-07 DIAGNOSIS — G894 Chronic pain syndrome: Secondary | ICD-10-CM

## 2022-01-07 NOTE — Telephone Encounter (Signed)
Called patient to notify of new appointments. Left voicemail with appointment information.

## 2022-01-10 ENCOUNTER — Encounter: Payer: Self-pay | Admitting: *Deleted

## 2022-01-19 ENCOUNTER — Other Ambulatory Visit: Payer: Self-pay | Admitting: Hematology and Oncology

## 2022-01-22 ENCOUNTER — Ambulatory Visit (HOSPITAL_COMMUNITY)
Admission: RE | Admit: 2022-01-22 | Discharge: 2022-01-22 | Disposition: A | Discharge status: Home / Self Care | Payer: Medicare Other | Source: Ambulatory Visit | Attending: Hematology and Oncology | Admitting: Hematology and Oncology

## 2022-01-22 DIAGNOSIS — C3490 Malignant neoplasm of unspecified part of unspecified bronchus or lung: Secondary | ICD-10-CM | POA: Diagnosis not present

## 2022-01-22 DIAGNOSIS — K5730 Diverticulosis of large intestine without perforation or abscess without bleeding: Secondary | ICD-10-CM | POA: Diagnosis not present

## 2022-01-22 MED ORDER — IOHEXOL 300 MG/ML  SOLN
100.0000 mL | Freq: Once | INTRAMUSCULAR | Status: AC | PRN
  Administered 2022-01-22: 100 mL via INTRAVENOUS

## 2022-01-23 ENCOUNTER — Inpatient Hospital Stay: Payer: Medicare Other | Admitting: Dietician

## 2022-01-23 ENCOUNTER — Inpatient Hospital Stay: Payer: Medicare Other

## 2022-01-23 ENCOUNTER — Inpatient Hospital Stay (HOSPITAL_BASED_OUTPATIENT_CLINIC_OR_DEPARTMENT_OTHER): Payer: Medicare Other | Admitting: Hematology and Oncology

## 2022-01-23 VITALS — BP 147/79 | HR 87 | Temp 98.5°F | Resp 17 | Wt 194.4 lb

## 2022-01-23 DIAGNOSIS — Z95828 Presence of other vascular implants and grafts: Secondary | ICD-10-CM

## 2022-01-23 DIAGNOSIS — E785 Hyperlipidemia, unspecified: Secondary | ICD-10-CM | POA: Diagnosis not present

## 2022-01-23 DIAGNOSIS — Z885 Allergy status to narcotic agent status: Secondary | ICD-10-CM | POA: Diagnosis not present

## 2022-01-23 DIAGNOSIS — C3490 Malignant neoplasm of unspecified part of unspecified bronchus or lung: Secondary | ICD-10-CM

## 2022-01-23 DIAGNOSIS — G629 Polyneuropathy, unspecified: Secondary | ICD-10-CM | POA: Diagnosis not present

## 2022-01-23 DIAGNOSIS — E876 Hypokalemia: Secondary | ICD-10-CM | POA: Diagnosis not present

## 2022-01-23 DIAGNOSIS — F17210 Nicotine dependence, cigarettes, uncomplicated: Secondary | ICD-10-CM | POA: Diagnosis not present

## 2022-01-23 DIAGNOSIS — Z880 Allergy status to penicillin: Secondary | ICD-10-CM | POA: Diagnosis not present

## 2022-01-23 DIAGNOSIS — G8929 Other chronic pain: Secondary | ICD-10-CM | POA: Diagnosis not present

## 2022-01-23 DIAGNOSIS — R600 Localized edema: Secondary | ICD-10-CM | POA: Diagnosis not present

## 2022-01-23 DIAGNOSIS — R197 Diarrhea, unspecified: Secondary | ICD-10-CM | POA: Diagnosis not present

## 2022-01-23 DIAGNOSIS — C3431 Malignant neoplasm of lower lobe, right bronchus or lung: Secondary | ICD-10-CM | POA: Diagnosis not present

## 2022-01-23 DIAGNOSIS — R110 Nausea: Secondary | ICD-10-CM | POA: Diagnosis not present

## 2022-01-23 DIAGNOSIS — E119 Type 2 diabetes mellitus without complications: Secondary | ICD-10-CM | POA: Diagnosis not present

## 2022-01-23 DIAGNOSIS — J449 Chronic obstructive pulmonary disease, unspecified: Secondary | ICD-10-CM | POA: Diagnosis not present

## 2022-01-23 DIAGNOSIS — Z9049 Acquired absence of other specified parts of digestive tract: Secondary | ICD-10-CM | POA: Diagnosis not present

## 2022-01-23 DIAGNOSIS — I10 Essential (primary) hypertension: Secondary | ICD-10-CM | POA: Diagnosis not present

## 2022-01-23 DIAGNOSIS — Z8719 Personal history of other diseases of the digestive system: Secondary | ICD-10-CM | POA: Diagnosis not present

## 2022-01-23 DIAGNOSIS — Z886 Allergy status to analgesic agent status: Secondary | ICD-10-CM | POA: Diagnosis not present

## 2022-01-23 DIAGNOSIS — Z882 Allergy status to sulfonamides status: Secondary | ICD-10-CM | POA: Diagnosis not present

## 2022-01-23 DIAGNOSIS — Z79899 Other long term (current) drug therapy: Secondary | ICD-10-CM | POA: Diagnosis not present

## 2022-01-23 DIAGNOSIS — J029 Acute pharyngitis, unspecified: Secondary | ICD-10-CM | POA: Diagnosis not present

## 2022-01-23 DIAGNOSIS — Z5112 Encounter for antineoplastic immunotherapy: Secondary | ICD-10-CM | POA: Diagnosis not present

## 2022-01-23 DIAGNOSIS — Z888 Allergy status to other drugs, medicaments and biological substances status: Secondary | ICD-10-CM | POA: Diagnosis not present

## 2022-01-23 DIAGNOSIS — F32A Depression, unspecified: Secondary | ICD-10-CM | POA: Diagnosis not present

## 2022-01-23 LAB — CMP (CANCER CENTER ONLY)
ALT: 9 U/L (ref 0–44)
AST: 13 U/L — ABNORMAL LOW (ref 15–41)
Albumin: 3.5 g/dL (ref 3.5–5.0)
Alkaline Phosphatase: 80 U/L (ref 38–126)
Anion gap: 3 — ABNORMAL LOW (ref 5–15)
BUN: 6 mg/dL — ABNORMAL LOW (ref 8–23)
CO2: 32 mmol/L (ref 22–32)
Calcium: 8.6 mg/dL — ABNORMAL LOW (ref 8.9–10.3)
Chloride: 101 mmol/L (ref 98–111)
Creatinine: 0.86 mg/dL (ref 0.44–1.00)
GFR, Estimated: >60 mL/min (ref >60)
Glucose, Bld: 109 mg/dL — ABNORMAL HIGH (ref 70–99)
Potassium: 4.2 mmol/L (ref 3.5–5.1)
Sodium: 136 mmol/L (ref 135–145)
Total Bilirubin: 0.3 mg/dL (ref 0.3–1.2)
Total Protein: 6.3 g/dL — ABNORMAL LOW (ref 6.5–8.1)

## 2022-01-23 LAB — CBC WITH DIFFERENTIAL (CANCER CENTER ONLY)
Abs Immature Granulocytes: 0.03 10*3/uL (ref 0.00–0.07)
Basophils Absolute: 0 10*3/uL (ref 0.0–0.1)
Basophils Relative: 0 %
Eosinophils Absolute: 0.4 10*3/uL (ref 0.0–0.5)
Eosinophils Relative: 5 %
HCT: 38.5 % (ref 36.0–46.0)
Hemoglobin: 12.3 g/dL (ref 12.0–15.0)
Immature Granulocytes: 0 %
Lymphocytes Relative: 25 %
Lymphs Abs: 2.4 10*3/uL (ref 0.7–4.0)
MCH: 28.2 pg (ref 26.0–34.0)
MCHC: 31.9 g/dL (ref 30.0–36.0)
MCV: 88.3 fL (ref 80.0–100.0)
Monocytes Absolute: 0.7 10*3/uL (ref 0.1–1.0)
Monocytes Relative: 7 %
Neutro Abs: 5.8 10*3/uL (ref 1.7–7.7)
Neutrophils Relative %: 63 %
Platelet Count: 172 10*3/uL (ref 150–400)
RBC: 4.36 MIL/uL (ref 3.87–5.11)
RDW: 19.4 % — ABNORMAL HIGH (ref 11.5–15.5)
WBC Count: 9.3 10*3/uL (ref 4.0–10.5)
nRBC: 0 % (ref 0.0–0.2)

## 2022-01-23 LAB — TSH: TSH: 0.438 u[IU]/mL (ref 0.350–4.500)

## 2022-01-23 MED ORDER — SODIUM CHLORIDE 0.9 % IV SOLN: Freq: Once | INTRAVENOUS | Status: AC

## 2022-01-23 MED ORDER — HEPARIN SOD (PORK) LOCK FLUSH 100 UNIT/ML IV SOLN
500.0000 [IU] | Freq: Once | INTRAVENOUS | Status: AC | PRN
  Administered 2022-01-23: 500 [IU]

## 2022-01-23 MED ORDER — SODIUM CHLORIDE 0.9 % IV SOLN
1200.0000 mg | Freq: Once | INTRAVENOUS | Status: AC
  Administered 2022-01-23: 1200 mg via INTRAVENOUS
  Filled 2022-01-23: qty 20

## 2022-01-23 MED ORDER — SODIUM CHLORIDE 0.9% FLUSH
10.0000 mL | Freq: Once | INTRAVENOUS | Status: AC
  Administered 2022-01-23: 10 mL

## 2022-01-23 MED ORDER — ONDANSETRON HCL 4 MG/2ML IJ SOLN
8.0000 mg | Freq: Once | INTRAMUSCULAR | Status: AC
  Administered 2022-01-23: 8 mg via INTRAVENOUS
  Filled 2022-01-23: qty 4

## 2022-01-23 MED ORDER — SODIUM CHLORIDE 0.9% FLUSH
10.0000 mL | INTRAVENOUS | Status: DC | PRN
  Administered 2022-01-23: 10 mL

## 2022-01-23 NOTE — Progress Notes (Signed)
Nutrition Follow-up:  Patient with extensive stage SCLC. She is currently receiving maintenance therapy with atezolizumab q21d.    Met with patient during infusion. She reports holiday's are hard for her without her daughter. Her appetite is about the same. Reports she is not eating much. Patient attributes this to a combination of family stress, loss of interest in food/diminished taste, as well as awaiting her partial to be made. Patient had one piece [place] Chocolate Cake for breakfast. She was NPO for scan yesterday and recalls ham/mayo sandwich for supper. Patient reports drinking water frequently throughout the day and at meal times. She denies nausea, vomiting, diarrhea, constipation. Patient reports lower extremity swelling has resolved.    Medications: reviewed   Labs: glucose 109, BUN 6, Ca 8.6  Anthropometrics: Wt 194 lb 7 oz slightly today decreased (20 mg lasix daily)  12/6 - 195 lb 1.6 oz 11/15 - 202 lb 4.8 oz  10/4 - 205 lb    NUTRITION DIAGNOSIS: Inadequate oral intake continues    INTERVENTION:  Encouraged soft moist foods high in protein - handout with idea provided Support and active listening     MONITORING, EVALUATION, GOAL: weight trends, intake   NEXT VISIT: Wednesday February 7 during infusion

## 2022-01-23 NOTE — Progress Notes (Signed)
[place] [phone]   [phone]  PROGRESS NOTE  Patient Care Team: Allwardt, Randa Evens, PA-C as PCP - General (Physician Assistant) Milus Banister, MD as Attending Physician (Gastroenterology) Dr. Darylene Price, MD as Consulting Physician (Orthopedic Surgery) Latanya Maudlin, MD as Consulting Physician (Orthopedic Surgery) Kathrynn Ducking, MD (Inactive) as Consulting Physician (Neurology) Okey Regal, [place] as Consulting Physician (Optometry) Delice Lesch Lezlie Octave, MD as Consulting Physician (Neurology) Edythe Clarity, [facility] (Pharmacist) Kassie Mends, RN as [place] History # Small Cell Lung Cancer, Extensive Stage 07/05/2020: CT abdomen for lower abdominal pain. New pulmonary nodular density noted 07/06/2020: CT chest showed 2.2 cm macrolobulated right lower lobe pulmonary nodule (favored) versus pathologically enlarged infrahilar lymph node 10/13/2020: PET CT scan performed, findings show 2 cm right lower lobe lung mass is hypermetabolic and consistent with primary lung neoplasm. Additionally found hypermetabolic 17 mm soft tissue lesion between the descending duodenum and the pancreatic head  10/19/2020: EGD to biopsy hypermetabolic lymph node. Biopsy results show small cell lung cancer 10/26/2020: establish care with Dr. Lorenso Courier  11/13/2020: Cycle 1 Day 1 of Carbo/Etop/Atezolizumab 12/04/2020: Cycle 2 Day 1 of Carbo/Etop/Atezolizumab 11/22-11/25/2022: admitted for E. Coli bacteremia/sepsis. Start of Cycle 3 delayed. 01/02/2021: Cycle 3 Day 1 of Carbo/Etop/Atezolizumab 01/23/2021: Cycle 4 Day 1 of Carbo/Etop/Atezolizumab 02/22/2021: Cycle 5 Day 1 of Atezolizumab Maintenance. Delayed start due to patient's COVID infection.  03/14/2021: Cycle 6 Day 1 of Atezolizumab Maintenance 04/05/2021: Cycle 7 Day 1 of Atezolizumab Maintenance 04/25/2021: Cycle 8 Day 1 of Atezolizumab  Maintenance 05/16/2021: Cycle 9 Day 1 of Atezolizumab Maintenance 06/06/2021: Cycle 10 Day 1 of Atezolizumab Maintenance 06/27/2021:  Cycle 11 Day 1 of Atezolizumab Maintenance 07/18/2021: Cycle 12 Day 1 of Atezolizumab Maintenance 08/08/2021: Cycle 13 Day 1 of Atezolizumab Maintenance 08/29/2021: Cycle 14 Day 1 of Atezolizumab Maintenance 09/19/2021: Cycle 15 Day 1 of Atezolizumab Maintenance 10/10/2021: Cycle 16 Day 1 of Atezolizumab Maintenance 10/31/2021: Cycle 17 Day 1 of Atezolizumab Maintenance 11/21/2021: Cycle 18 Day 1 of Atezolizumab Maintenance 12/12/2021: Cycle 19 Day 1 of Atezolizumab Maintenance 01/02/2022: Cycle 20 Day 1 of Atezolizumab Maintenance 01/23/2022: Cycle 21 Day 1 of Atezolizumab Maintenance  [place] History:  [name] 66 y.o. female with medical history significant for extensive stage small cell lung cancer who presents for a follow up visit. The patient's last visit was on 01/02/2022. In the interim since the last visit she has completed cycle 20 of chemotherapy. She presents today to start cycle 21 of chemotherapy.  On exam today [name] reports he continues to be tired and fatigued.  She notes her energy is currently about a 4 out of 10.  Her appetite has been also quite low.  She notes that she will eat breakfast but typically only snacks after then as she "needs food in her stomach".  She reports that she does continue to have a cough which can be severe enough to wake her up at night.  She is using a sore throat spray as well as [employer] which have been helping some.  She notes that she has not been producing much mucus but when it comes up it is greenish/white.  She is otherwise tolerating her immunotherapy quite well.  She denies fevers, chills, night sweats, chest pain or cough.She has no other complaints. A full 10 point ROS is listed below.  Overall she is willing and able to proceed with treatment today.   MEDICAL HISTORY:  Past Medical History:   Diagnosis Date   Allergic rhinitis    Anemia    Anxiety    Chicken pox    Chronic back pain    COPD (chronic obstructive pulmonary disease) (HCC)    Depression    DM (diabetes mellitus) ([place])    Essential hypertension    GERD (gastroesophageal reflux disease)    Headache    migraines   History of gastritis    EGD 2015   History of home oxygen therapy    2 liters at hs last 6 months   Hyperlipidemia    Hypothyroidism    Migraines    Osteoarthritis    oa   Scoliosis     SURGICAL HISTORY: Past Surgical History:  Procedure Laterality Date   APPENDECTOMY     1985   BIOPSY  07/24/2017   Procedure: BIOPSY;  Surgeon: Milus Banister, MD;  Location: [facility];  Service: Endoscopy;;   CARDIAC CATHETERIZATION N/A 10/31/2015   Procedure: Left Heart Cath and Coronary Angiography;  Surgeon: Leonie Man, MD;  Location: [facility];  Service: Cardiovascular;  Laterality: N/A;   CARPAL TUNNEL RELEASE Left    CARPAL TUNNEL RELEASE Right    CHOLECYSTECTOMY  late 1980's   COLONOSCOPY WITH PROPOFOL N/A 07/24/2017   Procedure: COLONOSCOPY WITH PROPOFOL;  Surgeon: Milus Banister, MD;  [facility];  Service: Endoscopy;  Laterality: N/A;   ESOPHAGOGASTRODUODENOSCOPY N/A 07/24/2017   Procedure: ESOPHAGOGASTRODUODENOSCOPY (EGD);  Surgeon: Milus Banister, MD;  Location: [facility];  Service: Endoscopy;  Laterality: N/A;   ESOPHAGOGASTRODUODENOSCOPY (EGD) WITH PROPOFOL N/A 10/19/2020   Procedure: ESOPHAGOGASTRODUODENOSCOPY (EGD) WITH PROPOFOL;  Surgeon: Milus Banister, MD;  [facility];  Service: Endoscopy;  Laterality: N/A;   FINE NEEDLE ASPIRATION N/A 10/19/2020   Procedure: FINE NEEDLE ASPIRATION (FNA) LINEAR;  Surgeon: Milus Banister, MD;  [facility];  Service: Endoscopy;  Laterality: N/A;   GALLBLADDER SURGERY  1991   HIP CLOSED REDUCTION Right 01/08/2016   Procedure: CLOSED MANIPULATION HIP;  Surgeon: Susa Day, MD;  [facility];   Service: Orthopedics;  Laterality: Right;   HIP CLOSED REDUCTION Right 01/19/2016   Procedure: ATTEMPTED CLOSED REDUCTION RIGHT HIP;  Surgeon: Wylene Simmer, MD;  [facility];  Service: Orthopedics;  Laterality: Right;   HIP CLOSED REDUCTION Right 01/20/2016   Procedure: CLOSED REDUCTION RIGHT TOTAL HIP;  Surgeon: Paralee Cancel, MD;  [facility];  Service: Orthopedics;  Laterality: Right;   HIP CLOSED REDUCTION Right 02/17/2016   Procedure: CLOSED REDUCTION RIGHT TOTAL HIP;  Surgeon: Rod Can, MD;  Location: [facility];  Service: Orthopedics;  Laterality: Right;   HIP CLOSED REDUCTION Right 02/28/2016   Procedure: CLOSED REDUCTION HIP;  Surgeon: Nicholes Stairs, MD;  [facility];  Service: Orthopedics;  Laterality: Right;   IR IMAGING GUIDED PORT INSERTION  11/01/2020   POLYPECTOMY  07/24/2017   Procedure: POLYPECTOMY;  Surgeon: Milus Banister, MD;  [facility];  Service: Endoscopy;;   [place], with 1 ovary removed and 2 nd ovary removed 2003   TOTAL HIP ARTHROPLASTY Right    Original surgery 2006 with revision 2010   TOTAL HIP REVISION Right 01/01/2016   Procedure: TOTAL HIP REVISION;  Surgeon: Paralee Cancel, MD;  [facility];  Service: Orthopedics;  Laterality: Right;   TOTAL HIP REVISION Right 03/02/2016   Procedure: TOTAL HIP REVISION;  Surgeon:  Paralee Cancel, MD;  [facility];  Service: Orthopedics;  Laterality: Right;   TOTAL HIP REVISION Right 09/02/2016   Procedure: Right hip constrained liner- posterior;  Surgeon: Paralee Cancel, MD;  [facility];  Service: Orthopedics;  Laterality: Right;   ULNAR NERVE TRANSPOSITION Right    UPPER ESOPHAGEAL ENDOSCOPIC ULTRASOUND (EUS) N/A 10/19/2020   Procedure: UPPER ESOPHAGEAL ENDOSCOPIC ULTRASOUND (EUS);  Surgeon: Milus Banister, MD;  Location: [facility];  Service: Endoscopy;  Laterality: N/A;  periduodenal lesion    SOCIAL HISTORY: Social History    Socioeconomic History   Marital status: Married    Spouse name: Not on file   Number of children: 2   Years of education: Not on file   Highest education level: Not on file  Occupational History   Occupation: disabled   Occupation: disabled  Tobacco Use   Smoking status: Every Day    Packs/day: 2.00    Years: 46.00    Total pack years: 92.00    Types: Cigarettes   Smokeless tobacco: Never   Tobacco comments:    2 packs of cigarettes smoked daily 12/13/21- declines smoking cessation  Vaping Use   Vaping Use: Never used  Substance and Sexual Activity   Alcohol use: No   Drug use: No   Sexual activity: Not Currently    Partners: Male  Other Topics Concern   Not on file  Social History Narrative   Right handed    Caffeine~ 2 cups per day    Lives at home with husband (strained relationship)   Primary caretaker for disabled brother who had aneurism   Daughter died Jun 26, 2018    Social Determinants of Health   Financial Resource Strain: Low Risk  (12/13/2021)   Overall Financial Resource Strain (CARDIA)    Difficulty of Paying Living Expenses: Not very hard  Food Insecurity: No Food Insecurity (12/13/2021)   Hunger Vital Sign    Worried About Running Out of Food in the Last Year: Never true    [place] in the Last Year: Never true  Transportation Needs: No Transportation Needs (12/13/2021)   PRAPARE - Hydrologist (Medical): No    Lack of Transportation (Non-Medical): No  Physical Activity: Inactive (12/13/2021)   Exercise Vital Sign    Days of Exercise per Week: 0 days    Minutes of Exercise per Session: 0 min  Stress: Stress Concern Present (12/13/2021)   [place]    Feeling of Stress : To some extent  Social Connections: Moderately Integrated (12/13/2021)   Social Connection and Isolation Panel [NHANES]    Frequency of Communication with Friends and Family:  Twice a week    Frequency of Social Gatherings with Friends and Family: Once a week    Attends Religious Services: 1 to 4 times per year    Active Member of [employer] or Organizations: No    Attends Archivist Meetings: Never    Marital Status: Married  Human resources officer Violence: Not At Risk (05/03/2021)   Humiliation, Afraid, Rape, and Kick questionnaire    Fear of Current or Ex-Partner: No    Emotionally Abused: No    Physically Abused: No    Sexually Abused: No    FAMILY HISTORY: Family History  Problem Relation Age of Onset   COPD Mother    Heart disease Mother    Lung disease Father  Asbestosis   Heart attack Father    Heart disease Father    Cerebral aneurysm Brother    Aneurysm Brother        Brain   Drug abuse Daughter    Epilepsy Son    Alcohol abuse Son    Drug abuse Son    Arthritis Maternal Grandmother    Heart disease Maternal Grandmother    Asthma Maternal Grandfather    Cancer Maternal Grandfather    Arthritis Paternal Grandmother    Heart disease Paternal Grandmother    Stroke Paternal Grandmother    Early death Paternal Grandfather    Heart disease Paternal Grandfather     ALLERGIES:  is allergic to metformin and related, nsaids, wellbutrin [bupropion], aleve [naproxen sodium], codeine, penicillins, and sulfonamide derivatives.  MEDICATIONS:  Current Outpatient Medications  Medication Sig Dispense Refill   albuterol (PROAIR HFA) 108 (90 Base) MCG/ACT inhaler 2 puffs every 4 hours as needed only  if your can't catch your breath 18 g 3   ALPRAZolam (XANAX) 1 MG tablet TAKE 1 TABLET BY MOUTH THREE TIMES DAILY AS NEEDED FOR ANXIETY 90 tablet 2   atorvastatin (LIPITOR) 20 MG tablet TAKE 1 TABLET BY MOUTH AT BEDTIME 90 tablet 1   benzonatate (TESSALON) 100 MG capsule TAKE 1 CAPSULE BY MOUTH THREE TIMES DAILY 30 capsule 1   Budeson-Glycopyrrol-Formoterol (BREZTRI AEROSPHERE) 160-9-4.8 MCG/ACT AERO Inhale 2 puffs into the lungs in the morning and  at bedtime. 10.7 g 3   DULoxetine (CYMBALTA) 60 MG capsule TAKE TWO CAPSULES BY MOUTH DAILY 60 capsule 5   fluticasone (FLONASE) 50 MCG/ACT nasal spray INSTILL 2 SPRAYS IN EACH NOSTRIL EVERY DAY 16 g 6   furosemide (LASIX) 20 MG tablet TAKE 1 TABLET BY MOUTH TWICE DAILY. If swelling improves can hold medication. 90 tablet 0   HYDROcodone-acetaminophen (NORCO) 10-325 MG tablet TAKE 1 TABLET BY MOUTH EVERY 4 HOURS AS NEEDED. MAX OF FIVE TABLETS DAILY 150 tablet 0   levothyroxine (SYNTHROID) 137 MCG tablet Take 1 tablet (137 mcg total) by mouth daily before breakfast. 30 tablet 5   loratadine (CLARITIN) 10 MG tablet Take 10 mg by mouth daily.     meloxicam (MOBIC) 15 MG tablet TAKE 1 TABLET BY MOUTH EVERY DAY 30 tablet 2   OLANZapine (ZYPREXA) 10 MG tablet Take 1 tablet (10 mg total) by mouth at bedtime. 30 tablet 2   omeprazole (PRILOSEC) 40 MG capsule TAKE ONE CAPSULE BY MOUTH TWICE DAILY 60 capsule 0   ondansetron (ZOFRAN) 8 MG tablet Take 1 tablet (8 mg total) by mouth 2 (two) times daily. 60 tablet 1   potassium chloride SA (KLOR-CON M) 20 MEQ tablet Take 1 tablet (20 mEq total) by mouth 2 (two) times daily. 60 tablet 2   prochlorperazine (COMPAZINE) 10 MG tablet Take 1 tablet (10 mg total) by mouth every 6 (six) hours as needed for nausea or vomiting. 30 tablet 0   triamterene-hydrochlorothiazide (MAXZIDE-25) 37.5-25 MG tablet TAKE 1 TABLET BY MOUTH DAILY 90 tablet 2   No current facility-administered medications for this visit.   Facility-Administered Medications Ordered in Other Visits  Medication Dose Route Frequency Provider Last Rate Last Admin   sodium chloride flush (NS) 0.9 % injection 10 mL  10 mL Intracatheter PRN Ledell Peoples IV, MD   10 mL at 01/23/22 1348    REVIEW OF SYSTEMS:   Constitutional: ( - ) fevers, ( - )  chills , ( - ) night sweats Eyes: ( - )  blurriness of vision, ( - ) double vision, ( - ) watery eyes Ears, nose, mouth, throat, and face: ( - ) mucositis, ( - )  sore throat Respiratory: ( - ) cough, ( +) dyspnea, ( - ) wheezes Cardiovascular: ( - ) palpitation, ( - ) chest discomfort, ( + ) lower extremity swelling Gastrointestinal:  ( -) nausea, ( - ) heartburn, ( - ) change in bowel habits Skin: ( - ) abnormal skin rashes Lymphatics: ( - ) new lymphadenopathy, ( - ) easy bruising Neurological: (+ ) numbness, ( - ) tingling, ( - ) new weaknesses Behavioral/Psych: ( - ) mood change, ( - ) new changes  All other systems were reviewed with the patient and are negative.  PHYSICAL EXAMINATION: ECOG PERFORMANCE STATUS: 1 - Symptomatic but completely ambulatory  Vitals:   01/23/22 1101  BP: (!) 147/79  Pulse: 87  Resp: 17  Temp: 98.5 F (36.9 C)  SpO2: 90%   Filed Weights   01/23/22 1101  Weight: 194 lb 7 oz (88.2 kg)    GENERAL: Well-appearing middle-age Caucasian female, alert, no distress and comfortable SKIN: skin color, texture, turgor are normal, no rashes or significant lesions EYES: conjunctiva are pink and non-injected, sclera clear LUNGS:  normal breathing effort. Diffuse wheezing hear on ausculation.  HEART: regular rate & rhythm and no murmurs. Mild bilateral lower extremity edema Musculoskeletal: no cyanosis of digits and no clubbing  PSYCH: alert & oriented x 3, fluent speech NEURO: no focal motor/sensory deficits  LABORATORY DATA:  I have reviewed the data as listed    Latest Ref Rng & Units 01/23/2022    9:51 AM 01/02/2022   10:57 AM 12/12/2021   10:51 AM  CBC  WBC 4.0 - 10.5 K/uL 9.3  9.5  11.4   Hemoglobin 12.0 - 15.0 g/dL 12.3  12.2  12.1   Hematocrit 36.0 - 46.0 % 38.5  38.0  38.2   Platelets 150 - 400 K/uL 172  191  232        Latest Ref Rng & Units 01/23/2022    9:51 AM 01/02/2022   10:57 AM 12/12/2021   10:51 AM  CMP  Glucose 70 - 99 mg/dL 109  100  97   BUN 8 - 23 mg/dL 6  12  17    Creatinine 0.44 - 1.00 mg/dL 0.86  1.07  1.18   Sodium 135 - 145 mmol/L 136  136  136   Potassium 3.5 - 5.1 mmol/L 4.2   3.8  3.2   Chloride 98 - 111 mmol/L 101  102  97   CO2 22 - 32 mmol/L 32  28  32   Calcium 8.9 - 10.3 mg/dL 8.6  9.1  9.0   Total Protein 6.5 - 8.1 g/dL 6.3  6.4  6.7   Total Bilirubin 0.3 - 1.2 mg/dL 0.3  0.5  0.6   Alkaline Phos 38 - 126 U/L 80  89  98   AST 15 - 41 U/L 13  13  12    ALT 0 - 44 U/L 9  6  6      No results found for: "MPROTEIN" Lab Results  Component Value Date   KPAFRELGTCHN 0.75 06/30/2014   LAMBDASER 3.78 (H) 06/30/2014   KAPLAMBRATIO 0.20 (L) 06/30/2014     RADIOGRAPHIC STUDIES: No results found.  ASSESSMENT & PLAN ENA DEMARY 66 y.o. female with medical history significant for extensive stage small cell lung cancer who presents for a follow up  visit.   After review of the labs, review of the records, and discussion with the patient the patients findings are most consistent with extensive stage small cell lung cancer with metastasis from the right lower lobe to the lymph nodes of the abdomen.  At this time we will pursue triple therapy with carboplatin, etoposide, and atezolizumab.  After 4 cycles we will convert to maintenance atezolizumab alone.  We previously discussed the risks and benefits of this therapy and the patient was in agreement to proceed with this treatment.  The treatment of choice consist of carboplatin, etoposide, and atezolizumab.  The regimen consists of carboplatin AUC of 5 IV on day 1, etoposide 100 mg per metered squared IV on day 1, 2, and 3 and atezolizumab 1200 mg on day 1.  This continues for 21-day cycles.  After 4 cycles the patient proceeds with atezolizumab maintenance therapy alone.   #Small Cell Lung Cancer, Extensive Stage -- MRI of the brain shows no evidence of intracranial spread --Findings are currently consistent with metastatic small cell lung cancer with metastatic spread to the lymph nodes of the abdomen --Prognosis is poor with anticipated life span of less than 12 months -- Patient has extensive stage due to  metastatic spread to lymph nodes in the abdomen --Plan to proceed with carboplatin, etoposide, and atezolizumab --will consider prophylactic cranial radiation after start of chemotherapy.  Plan: --today is Cycle 21 Day 1 of Atezolizumab maintenance --labs today were reviewed and adequate for treatment. Hgb 12.3, white blood cell 9.3, platelets 172, and MCV 88.3. --CT scan q 3 months. Last scan in Dec 2023 showed continued excellent response to therapy. CT scan next in March 2024.  --RTC in 3 weeks for next cycle of maintenance atezolizumab   #Supportive Care -- chemotherapy education complete -- port placed -- zofran 8mg  q8H PRN and compazine 10mg  PO q6H for nausea -- EMLA cream for port -- Will add mirtazapine in order to help with appetite/sleep  #Nausea: --Symptoms improved with zofran and olanzapine.  --Added IV zofran to infusion premeds for better symptom control.   #Hypokalemia:  --currently taking potassium chloride 20 mEq PO daily, increase to 20 meq BID --potassium level is still 3.8 today. Continue PO supplementation.   #Lower extremity edema-worsening --Currently takes lasix 20 mg daily, recommending increasing to q 6 hours x 2 doses daily.  --Improving. Continue to monitor.   #Neuropathy involving fingers/feet: --Patient currently takes Cymbalta   No orders of the defined types were placed in this encounter.  All questions were answered. The patient knows to call the clinic with any problems, questions or concerns.  I have spent a total of 30 minutes minutes of face-to-face and non-face-to-face time, preparing to see the patient, performing a medically appropriate examination, counseling and educating the patient, ordering medications/tests, documenting clinical information in the electronic health record, and care coordination.   Ledell Peoples, MD Department of Hematology/Oncology [place] [facility] Phone: [phone] Pager:  [phone] Email: [email or web address]  01/23/2022 4:35 PM

## 2022-01-23 NOTE — Patient Instructions (Signed)
[facility] ONCOLOGY  Discharge Instructions: Thank you for choosing [facility] to provide your oncology and hematology care.   If you have a lab appointment with the [facility], please go directly to the [facility] and check in at the registration area.   Wear comfortable clothing and clothing appropriate for easy access to any Portacath or PICC line.   We strive to give you quality time with your provider. You may need to reschedule your appointment if you arrive late (15 or more minutes).  Arriving late affects you and other patients whose appointments are after yours.  Also, if you miss three or more appointments without notifying the office, you may be dismissed from the clinic at the provider's discretion.      For prescription refill requests, have your pharmacy contact our office and allow 72 hours for refills to be completed.    Today you received the following chemotherapy and/or immunotherapy agents tecentriq      To help prevent nausea and vomiting after your treatment, we encourage you to take your nausea medication as directed.  BELOW ARE SYMPTOMS THAT SHOULD BE REPORTED IMMEDIATELY: *FEVER GREATER THAN 100.4 F (38 C) OR HIGHER *CHILLS OR SWEATING *NAUSEA AND VOMITING THAT IS NOT CONTROLLED WITH YOUR NAUSEA MEDICATION *UNUSUAL SHORTNESS OF BREATH *UNUSUAL BRUISING OR BLEEDING *URINARY PROBLEMS (pain or burning when urinating, or frequent urination) *BOWEL PROBLEMS (unusual diarrhea, constipation, pain near the anus) TENDERNESS IN MOUTH AND THROAT WITH OR WITHOUT PRESENCE OF ULCERS (sore throat, sores in mouth, or a toothache) UNUSUAL RASH, SWELLING OR PAIN  UNUSUAL VAGINAL DISCHARGE OR ITCHING   Items with * indicate a potential emergency and should be followed up as soon as possible or go to the Emergency Department if any problems should occur.  Please show the CHEMOTHERAPY ALERT CARD or IMMUNOTHERAPY ALERT CARD at check-in to  the Emergency Department and triage nurse.  Should you have questions after your visit or need to cancel or reschedule your appointment, please contact [facility]  Dept: [phone]  and follow the prompts.  Office hours are 8:00 a.m. to 4:30 p.m. Monday - Friday. Please note that voicemails left after 4:00 p.m. may not be returned until the following business day.  We are closed weekends and major holidays. You have access to a nurse at all times for urgent questions. Please call the main number to the clinic Dept: [phone] and follow the prompts.   For any non-urgent questions, you may also contact your provider using MyChart. We now offer e-Visits for anyone 20 and older to request care online for non-urgent symptoms. For details visit [email or web address].   Also download the MyChart app! Go to the app store, search "MyChart", open the app, select , and log in with your MyChart username and password.

## 2022-01-24 ENCOUNTER — Telehealth: Payer: Self-pay | Admitting: *Deleted

## 2022-01-24 DIAGNOSIS — C3490 Malignant neoplasm of unspecified part of unspecified bronchus or lung: Secondary | ICD-10-CM

## 2022-01-24 LAB — T4: T4, Total: 9.9 ug/dL (ref 4.5–12.0)

## 2022-01-24 MED ORDER — AZITHROMYCIN 250 MG PO TABS: ORAL_TABLET | ORAL | 0 refills | Status: DC

## 2022-01-24 NOTE — Telephone Encounter (Signed)
TCT patient regarding recent scan results. Spoke with her. Advised that her CT scan did not show any evidence of residual or recurrent small cell lung cancer. She is pleased with this. She does state that her cough has gotten worse and it has changed color to yellow.  She states the cough is wearing her out. Spoke to Dr. Lorenso Courier. He has ordered a CXR to be done @ [facility] as well as a  prescription for antibiotics. Advised pt of both of these orders.  Pt voiced appreciation and understanding. She will get the CXR tomorrow @ [facility]. Advised to call back if she does not feel better after completing the antibiotics.  She is agreeable to this.

## 2022-01-24 NOTE — Telephone Encounter (Signed)
-----   Message from Orson Slick, MD sent at 01/23/2022  4:50 PM EST ----- Please let [name] know that her CT scan did not show any evidence of residual or recurrent small cell lung cancer. ----- Message ----- From: Interface, Rad Results In Sent: 01/23/2022   4:40 PM EST To: Orson Slick, MD

## 2022-01-25 ENCOUNTER — Ambulatory Visit (HOSPITAL_COMMUNITY)
Admission: RE | Admit: 2022-01-25 | Discharge: 2022-01-25 | Disposition: A | Discharge status: Home / Self Care | Payer: Medicare Other | Source: Ambulatory Visit | Attending: Hematology and Oncology | Admitting: Hematology and Oncology

## 2022-01-25 DIAGNOSIS — R059 Cough, unspecified: Secondary | ICD-10-CM | POA: Diagnosis not present

## 2022-01-25 DIAGNOSIS — C3490 Malignant neoplasm of unspecified part of unspecified bronchus or lung: Secondary | ICD-10-CM | POA: Insufficient documentation

## 2022-01-29 ENCOUNTER — Telehealth: Payer: Self-pay

## 2022-01-29 ENCOUNTER — Other Ambulatory Visit: Payer: Self-pay | Admitting: Physician Assistant

## 2022-01-29 DIAGNOSIS — E785 Hyperlipidemia, unspecified: Secondary | ICD-10-CM

## 2022-01-29 NOTE — Telephone Encounter (Addendum)
Spoke with patient to relay results from message below. Patient took last dose of Z-pack today. Encouraged patient to reach out to PCP for additional support. Patient does not feel as if she has improved with antibiotic.   ----- Message from Orson Slick, MD sent at 01/29/2022  9:07 AM EST ----- Please let [name] know that her Chest Xray did not show any evidence of pneumonia. Please assure she has finished her Zpack. We will see her back as scheduled later this month.  ----- Message ----- From: Interface, Rad Results In Sent: 01/27/2022  11:49 PM EST To: Orson Slick, MD

## 2022-02-01 ENCOUNTER — Encounter: Payer: Self-pay | Admitting: Physical Medicine and Rehabilitation

## 2022-02-01 ENCOUNTER — Encounter: Payer: Self-pay | Admitting: Hematology and Oncology

## 2022-02-01 ENCOUNTER — Encounter: Payer: 59 | Attending: Physical Medicine and Rehabilitation | Admitting: Physical Medicine and Rehabilitation

## 2022-02-01 VITALS — BP 134/82 | HR 92 | Ht 64.0 in | Wt 197.0 lb

## 2022-02-01 DIAGNOSIS — F32A Depression, unspecified: Secondary | ICD-10-CM | POA: Diagnosis not present

## 2022-02-01 DIAGNOSIS — F419 Anxiety disorder, unspecified: Secondary | ICD-10-CM | POA: Insufficient documentation

## 2022-02-01 DIAGNOSIS — C3490 Malignant neoplasm of unspecified part of unspecified bronchus or lung: Secondary | ICD-10-CM | POA: Insufficient documentation

## 2022-02-01 DIAGNOSIS — G894 Chronic pain syndrome: Secondary | ICD-10-CM | POA: Insufficient documentation

## 2022-02-01 MED ORDER — DULOXETINE HCL 60 MG PO CPEP: 120.0000 mg | ORAL_CAPSULE | Freq: Every day | ORAL | 1 refills | Status: DC

## 2022-02-01 MED ORDER — OXYCODONE HCL 5 MG PO TABS: 5.0000 mg | ORAL_TABLET | ORAL | 0 refills | Status: DC | PRN

## 2022-02-01 MED ORDER — LEVOTHYROXINE SODIUM 137 MCG PO TABS: 137.0000 ug | ORAL_TABLET | Freq: Every day | ORAL | 1 refills | Status: DC

## 2022-02-01 NOTE — Progress Notes (Signed)
Subjective:    Patient ID: [name], female    DOB: March 23, 1955, 67 y.o.   MRN: [id number]  HPI  Pt is a 67 yr old female with hx of newly dx'd COPD, still smoking, HTN, DM- diet controlled; anxiety and depression;  Scoliosis and deg back issues,  Generalized OA;  chronic pain- here for f/u of chronic pain issues. With new 2.2 cm R lower pulmonary nodule and hemorrhoids.  Now has dx of Small cell lung cancer extensive stage per oncology note.   Methadone makes her so sleepy- wants to get rid of it doesn't want to take it anymore- took 1 month and just made her too sleepy so asked [place] to remove/get rid of them .  Still taking immunotherapy for lung CA- responding even better than oncologist thought she would.   Has had shots put in back by Dr Nelva Bush- he told her that the only thing left is surgery on back.        Pain Inventory Average Pain 7 Pain Right Now 7 My pain is sharp, burning, dull, stabbing, and aching  In the last 24 hours, has pain interfered with the following? General activity 10 Relation with others 10 Enjoyment of life 10 What TIME of day is your pain at its worst? morning  Sleep (in general) Poor  Pain is worse with: bending Pain improves with: rest and medication Relief from Meds: 5  Family History  Problem Relation Age of Onset   COPD Mother    Heart disease Mother    Lung disease Father        Asbestosis   Heart attack Father    Heart disease Father    Cerebral aneurysm Brother    Aneurysm Brother        Brain   Drug abuse Daughter    Epilepsy Son    Alcohol abuse Son    Drug abuse Son    Arthritis Maternal Grandmother    Heart disease Maternal Grandmother    Asthma Maternal Grandfather    Cancer Maternal Grandfather    Arthritis Paternal Grandmother    Heart disease Paternal Grandmother    Stroke Paternal Grandmother    Early death Paternal Grandfather    Heart disease Paternal Grandfather    Social History   Socioeconomic  History   Marital status: Married    Spouse name: Not on file   Number of children: 2   Years of education: Not on file   Highest education level: Not on file  Occupational History   Occupation: disabled   Occupation: disabled  Tobacco Use   Smoking status: Every Day    Packs/day: 2.00    Years: 46.00    Total pack years: 92.00    Types: Cigarettes   Smokeless tobacco: Never   Tobacco comments:    2 packs of cigarettes smoked daily 12/13/21- declines smoking cessation  Vaping Use   Vaping Use: Never used  Substance and Sexual Activity   Alcohol use: No   Drug use: No   Sexual activity: Not Currently    Partners: Male  Other Topics Concern   Not on file  Social History Narrative   Right handed    Caffeine~ 2 cups per day    Lives at home with husband (strained relationship)   Primary caretaker for disabled brother who had aneurism   Daughter died 06/14/2018    Social Determinants of Health   Financial Resource Strain: Low Risk  (12/13/2021)  Overall Financial Resource Strain (CARDIA)    Difficulty of Paying Living Expenses: Not very hard  Food Insecurity: No Food Insecurity (12/13/2021)   Hunger Vital Sign    Worried About Running Out of Food in the Last Year: Never true    Ran Out of Food in the Last Year: Never true  Transportation Needs: No Transportation Needs (12/13/2021)   PRAPARE - Hydrologist (Medical): No    Lack of Transportation (Non-Medical): No  Physical Activity: Inactive (12/13/2021)   Exercise Vital Sign    Days of Exercise per Week: 0 days    Minutes of Exercise per Session: 0 min  Stress: Stress Concern Present (12/13/2021)   [place]    Feeling of Stress : To some extent  Social Connections: Moderately Integrated (12/13/2021)   Social Connection and Isolation Panel [NHANES]    Frequency of Communication with Friends and Family: Twice a week     Frequency of Social Gatherings with Friends and Family: Once a week    Attends Religious Services: 1 to 4 times per year    Active Member of [employer] or Organizations: No    Attends Archivist Meetings: Never    Marital Status: Married   Past Surgical History:  Procedure Laterality Date   APPENDECTOMY     1985   BIOPSY  07/24/2017   Procedure: BIOPSY;  Surgeon: Milus Banister, MD;  Location: [facility];  Service: Endoscopy;;   CARDIAC CATHETERIZATION N/A 10/31/2015   Procedure: Left Heart Cath and Coronary Angiography;  Surgeon: Leonie Man, MD;  Location: [facility];  Service: Cardiovascular;  Laterality: N/A;   CARPAL TUNNEL RELEASE Left    CARPAL TUNNEL RELEASE Right    CHOLECYSTECTOMY  late 1980's   COLONOSCOPY WITH PROPOFOL N/A 07/24/2017   Procedure: COLONOSCOPY WITH PROPOFOL;  Surgeon: Milus Banister, MD;  Location: [facility];  Service: Endoscopy;  Laterality: N/A;   ESOPHAGOGASTRODUODENOSCOPY N/A 07/24/2017   Procedure: ESOPHAGOGASTRODUODENOSCOPY (EGD);  Surgeon: Milus Banister, MD;  Location: [facility];  Service: Endoscopy;  Laterality: N/A;   ESOPHAGOGASTRODUODENOSCOPY (EGD) WITH PROPOFOL N/A 10/19/2020   Procedure: ESOPHAGOGASTRODUODENOSCOPY (EGD) WITH PROPOFOL;  Surgeon: Milus Banister, MD;  [facility]: [facility];  Service: Endoscopy;  Laterality: N/A;   FINE NEEDLE ASPIRATION N/A 10/19/2020   Procedure: FINE NEEDLE ASPIRATION (FNA) LINEAR;  Surgeon: Milus Banister, MD;  [facility]: [facility];  Service: Endoscopy;  Laterality: N/A;   GALLBLADDER SURGERY  1991   HIP CLOSED REDUCTION Right 01/08/2016   Procedure: CLOSED MANIPULATION HIP;  Surgeon: Susa Day, MD;  [facility]: [facility];  Service: Orthopedics;  Laterality: Right;   HIP CLOSED REDUCTION Right 01/19/2016   Procedure: ATTEMPTED CLOSED REDUCTION RIGHT HIP;  Surgeon: Wylene Simmer, MD;  [facility];  Service: Orthopedics;  Laterality: Right;   HIP CLOSED REDUCTION Right 01/20/2016    Procedure: CLOSED REDUCTION RIGHT TOTAL HIP;  Surgeon: Paralee Cancel, MD;  [facility];  Service: Orthopedics;  Laterality: Right;   HIP CLOSED REDUCTION Right 02/17/2016   Procedure: CLOSED REDUCTION RIGHT TOTAL HIP;  Surgeon: Rod Can, MD;  Location: [facility];  Service: Orthopedics;  Laterality: Right;   HIP CLOSED REDUCTION Right 02/28/2016   Procedure: CLOSED REDUCTION HIP;  Surgeon: Nicholes Stairs, MD;  [facility];  Service: Orthopedics;  Laterality: Right;   IR IMAGING GUIDED PORT INSERTION  11/01/2020   POLYPECTOMY  07/24/2017   Procedure:  POLYPECTOMY;  Surgeon: Milus Banister, MD;  Location: [facility];  Service: Endoscopy;;   [place], with 1 ovary removed and 2 nd ovary removed 2003   [place] Right    Original surgery 2006 with revision 2010   TOTAL HIP REVISION Right 01/01/2016   Procedure: TOTAL HIP REVISION;  Surgeon: Paralee Cancel, MD;  [facility];  Service: Orthopedics;  Laterality: Right;   TOTAL HIP REVISION Right 03/02/2016   Procedure: TOTAL HIP REVISION;  Surgeon: Paralee Cancel, MD;  [facility]: [facility];  Service: Orthopedics;  Laterality: Right;   TOTAL HIP REVISION Right 09/02/2016   Procedure: Right hip constrained liner- posterior;  Surgeon: Paralee Cancel, MD;  [facility];  Service: Orthopedics;  Laterality: Right;   ULNAR NERVE TRANSPOSITION Right    UPPER ESOPHAGEAL ENDOSCOPIC ULTRASOUND (EUS) N/A 10/19/2020   Procedure: UPPER ESOPHAGEAL ENDOSCOPIC ULTRASOUND (EUS);  Surgeon: Milus Banister, MD;  Location: [facility];  Service: Endoscopy;  Laterality: N/A;  periduodenal lesion   Past Surgical History:  Procedure Laterality Date   APPENDECTOMY     1985   BIOPSY  07/24/2017   Procedure: BIOPSY;  Surgeon: Milus Banister, MD;  [facility];  Service: Endoscopy;;   CARDIAC CATHETERIZATION N/A 10/31/2015   Procedure: Left Heart Cath and Coronary Angiography;  Surgeon:  Leonie Man, MD;  Location: [facility];  Service: Cardiovascular;  Laterality: N/A;   CARPAL TUNNEL RELEASE Left    CARPAL TUNNEL RELEASE Right    CHOLECYSTECTOMY  late 1980's   COLONOSCOPY WITH PROPOFOL N/A 07/24/2017   Procedure: COLONOSCOPY WITH PROPOFOL;  Surgeon: Milus Banister, MD;  [facility];  Service: Endoscopy;  Laterality: N/A;   ESOPHAGOGASTRODUODENOSCOPY N/A 07/24/2017   Procedure: ESOPHAGOGASTRODUODENOSCOPY (EGD);  Surgeon: Milus Banister, MD;  Location: [facility];  Service: Endoscopy;  Laterality: N/A;   ESOPHAGOGASTRODUODENOSCOPY (EGD) WITH PROPOFOL N/A 10/19/2020   Procedure: ESOPHAGOGASTRODUODENOSCOPY (EGD) WITH PROPOFOL;  Surgeon: Milus Banister, MD;  [facility];  Service: Endoscopy;  Laterality: N/A;   FINE NEEDLE ASPIRATION N/A 10/19/2020   Procedure: FINE NEEDLE ASPIRATION (FNA) LINEAR;  Surgeon: Milus Banister, MD;  [facility];  Service: Endoscopy;  Laterality: N/A;   GALLBLADDER SURGERY  1991   HIP CLOSED REDUCTION Right 01/08/2016   Procedure: CLOSED MANIPULATION HIP;  Surgeon: Susa Day, MD;  [facility];  Service: Orthopedics;  Laterality: Right;   HIP CLOSED REDUCTION Right 01/19/2016   Procedure: ATTEMPTED CLOSED REDUCTION RIGHT HIP;  Surgeon: Wylene Simmer, MD;  [facility];  Service: Orthopedics;  Laterality: Right;   HIP CLOSED REDUCTION Right 01/20/2016   Procedure: CLOSED REDUCTION RIGHT TOTAL HIP;  Surgeon: Paralee Cancel, MD;  [facility];  Service: Orthopedics;  Laterality: Right;   HIP CLOSED REDUCTION Right 02/17/2016   Procedure: CLOSED REDUCTION RIGHT TOTAL HIP;  Surgeon: Rod Can, MD;  Location: [facility];  Service: Orthopedics;  Laterality: Right;   HIP CLOSED REDUCTION Right 02/28/2016   Procedure: CLOSED REDUCTION HIP;  Surgeon: Nicholes Stairs, MD;  [facility];  Service: Orthopedics;  Laterality: Right;   IR IMAGING GUIDED PORT INSERTION  11/01/2020   POLYPECTOMY   07/24/2017   Procedure: POLYPECTOMY;  Surgeon: Milus Banister, MD;  [facility];  Service: Endoscopy;;   [place], with 1 ovary removed and 2 nd  ovary removed 2003   TOTAL HIP ARTHROPLASTY Right    Original surgery 2006 with revision 2010   TOTAL HIP REVISION Right 01/01/2016   Procedure: TOTAL HIP REVISION;  Surgeon: Paralee Cancel, MD;  [facility];  Service: Orthopedics;  Laterality: Right;   TOTAL HIP REVISION Right 03/02/2016   Procedure: TOTAL HIP REVISION;  Surgeon: Paralee Cancel, MD;  Location[facility];  Service: Orthopedics;  Laterality: Right;   TOTAL HIP REVISION Right 09/02/2016   Procedure: Right hip constrained liner- posterior;  Surgeon: Paralee Cancel, MD;  [facility];  Service: Orthopedics;  Laterality: Right;   ULNAR NERVE TRANSPOSITION Right    UPPER ESOPHAGEAL ENDOSCOPIC ULTRASOUND (EUS) N/A 10/19/2020   Procedure: UPPER ESOPHAGEAL ENDOSCOPIC ULTRASOUND (EUS);  Surgeon: Milus Banister, MD;  Location: [facility];  Service: Endoscopy;  Laterality: N/A;  periduodenal lesion   Past Medical History:  Diagnosis Date   Allergic rhinitis    Anemia    Anxiety    Chicken pox    Chronic back pain    COPD (chronic obstructive pulmonary disease) (HCC)    Depression    DM (diabetes mellitus) ([place])    Essential hypertension    GERD (gastroesophageal reflux disease)    Headache    migraines   History of gastritis    EGD 2015   History of home oxygen therapy    2 liters at hs last 6 months   Hyperlipidemia    Hypothyroidism    Migraines    Osteoarthritis    oa   Scoliosis    BP 134/82   Pulse 92   Ht 5\' 4"  (1.626 m)   Wt 197 lb (89.4 kg)   SpO2 95%   BMI 33.81 kg/m   Opioid Risk Score:   Fall Risk Score:  `1  Depression screen PHQ 2/9     12/13/2021    2:59 PM 10/22/2021    3:09 PM 06/22/2021    3:14 PM 05/03/2021    1:35 PM 03/05/2021    2:26 PM 03/05/2021    2:25 PM 04/10/2020    9:18 AM   Depression screen PHQ 2/9  Decreased Interest 3 1 1  0 0 0 2  Down, Depressed, Hopeless 3 1 1 1  0 0 1  PHQ - 2 Score 6 2 2 1  0 0 3  Altered sleeping 2   0   0  Tired, decreased energy 3   2   2   Change in appetite 0   0   0  Feeling bad or failure about yourself  0   0   1  Trouble concentrating 1   1   1   Moving slowly or fidgety/restless 0   0   0  Suicidal thoughts 0   0   0  PHQ-9 Score 12   4   7   Difficult doing work/chores Not difficult at all   Somewhat difficult        Review of Systems  Musculoskeletal:  Positive for back pain.       Bilateral knee pain Bilateral ankle pain  All other systems reviewed and are negative.     Objective:   Physical Exam Awake, alert, appropriate, sitting up in chair; wearing mask; congested cough, NAD (Still smoking)        Assessment & Plan:   Pt is a 67 yr old female with hx of newly dx'd COPD, still smoking, HTN, DM- diet controlled; anxiety and depression;  Scoliosis and deg  back issues,  Generalized OA;  chronic pain- here for f/u of chronic pain issues. With new 2.2 cm R lower pulmonary nodule and hemorrhoids.  Now has dx of Small cell lung cancer extensive stage per oncology note.   Still wants to give up. Had a poor bday from husband. We discussed getting acknowledgement from other people.   2. Change Oxycodone 5 mg q4 hours as needed max 5 tabs/day.  Will try and switching from Norco 10/325 mg- q4 hours max 5 pills/day.   3.   Still smoking- terrible cough- d/w pt about smoking cessation- esp if decided wants to stay here with [place]! And surgery for back isn't out of the questions, depending on results-    4. Con't Cymbalta- 120 mg daily- send in 3 moths supply with 1 refill   5. Synthroid 137 mcg daily- 3 months supply- 1 refill.   6. F/U in 3 months- for UDS/per clinic policy.    I spent a total of  21  minutes on total care today- >50% coordination of care- due to discussion about smoking cessation and  living!

## 2022-02-01 NOTE — Patient Instructions (Signed)
Pt is a 67 yr old female with hx of newly dx'd COPD, still smoking, HTN, DM- diet controlled; anxiety and depression;  Scoliosis and deg back issues,  Generalized OA;  chronic pain- here for f/u of chronic pain issues. With new 2.2 cm R lower pulmonary nodule and hemorrhoids.  Now has dx of Small cell lung cancer extensive stage per oncology note.   Still wants to give up. Had a poor bday from husband. We discussed getting acknowledgement from other people.   2. Change Oxycodone 5 mg q4 hours as needed max 5 tabs/day.  Will try and switching from Norco 10/325 mg- q4 hours max 5 pills/day.   3.   Still smoking- terrible cough- d/w pt about smoking cessation- esp if decided wants to stay here with [place]! And surgery for back isn't out of the questions, depending on results-    4. Con't Cymbalta- 120 mg daily- send in 3 moths supply with 1 refill   5. Synthroid 137 mcg daily- 3 months supply- 1 refill.   6. F/U in 3 months- for UDS/per clinic policy.

## 2022-02-04 ENCOUNTER — Encounter: Payer: Self-pay | Admitting: Physical Medicine and Rehabilitation

## 2022-02-07 ENCOUNTER — Ambulatory Visit (INDEPENDENT_AMBULATORY_CARE_PROVIDER_SITE_OTHER): Payer: 59 | Admitting: *Deleted

## 2022-02-07 DIAGNOSIS — I10 Essential (primary) hypertension: Secondary | ICD-10-CM

## 2022-02-07 DIAGNOSIS — J449 Chronic obstructive pulmonary disease, unspecified: Secondary | ICD-10-CM

## 2022-02-07 NOTE — Patient Instructions (Signed)
Please call the care guide team at [phone] if you need to cancel or reschedule your appointment.   If you are experiencing a Mental Health or [place] or need someone to talk to, please call the Suicide and Crisis Lifeline: 988 call the [place] National Suicide Prevention Lifeline: 954-341-6344 or TTY: 5061562009 TTY 605-120-5481) to talk to a trained counselor call 1-800-273-TALK (toll free, 24 hour hotline) go to [facility] [address], [place] [phone]) call the [facility]: [phone] call 911   Following is a copy of the CCM Program Consent:  CCM service includes personalized support from designated clinical staff supervised by the physician, including individualized plan of care and coordination with other care providers 24/7 contact phone numbers for assistance for urgent and routine care needs. Service will only be billed when office clinical staff spend 20 minutes or more in a month to coordinate care. Only one practitioner may furnish and bill the service in a calendar month. The patient may stop CCM services at amy time (effective at the end of the month) by phone call to the office staff. The patient will be responsible for cost sharing (co-pay) or up to 20% of the service fee (after annual deductible is met)  Following is a copy of your full provider care plan:   Goals Addressed             This Visit's Progress    CCM (COPD) EXPECTED OUTCOME: MONITOR, SELF-MANAGE AND REDUCE SYMPTOMS OF COPD       Current Barriers:  Knowledge Deficits related to COPD management Chronic Disease Management support and education needs related to COPD Patient reports she lives with her spouse and he assists with cooking, housekeeping, and laundry, pt continues to drive, has walker to use as needed, has built in shower seat. Patient reports she continues to smoke 2 ppd and not interested in smoking  cessation Patient continues taking treatments for small cell lung cancer and states " I'll be taking these the rest of my life", next infusion due in February Depression PHQ-9=12, pt is on medication and has tried counseling in the past, states this did not work well for her and declines LCSW services Patient reports she tires out easily  Planned Interventions: Advised patient to track and manage COPD triggers Provided instruction about proper use of medications used for management of COPD including inhalers Advised patient to self assesses COPD action plan zone and make appointment with provider if in the yellow zone for 48 hours without improvement Advised patient to engage in light exercise as tolerated 3-5 days a week to aid in the the management of COPD Provided education about and advised patient to utilize infection prevention strategies to reduce risk of respiratory infection Discussed the importance of adequate rest and management of fatigue with COPD Reviewed importance of relaxation and rest, energy conservation  Symptom Management: Take medications as prescribed   Attend all scheduled provider appointments Call pharmacy for medication refills 3-7 days in advance of running out of medications Attend church or other social activities Call provider office for new concerns or questions  identify and remove indoor air pollutants limit outdoor activity during cold weather listen for public air quality announcements every day do breathing exercises every day develop a rescue plan eliminate symptom triggers at home follow rescue plan if symptoms flare-up eat healthy/prescribed diet: low sodium, heart healthy get at least 7 to 8 hours of sleep at night use devices that  will help like a cane, sock-puller or reacher Follow COPD action plan- call your doctor early on for change in symptoms or health status Continue practicing good handwashing, avoid sick people, wear a mask as  needed Alternate activity with rest  Follow Up Plan: Telephone follow up appointment with care management team member scheduled for:  04/04/22 at 130 pm       CCM (HYPERTENSION) EXPECTED OUTCOME: MONITOR, SELF-MANAGE AND REDUCE SYMPTOMS OF HYPERTENSION       Current Barriers:  Knowledge Deficits related to Hypertension management Chronic Disease Management support and education needs related to Hypertension Patient reports she checks blood pressure and readings are usually good, recently has been relying on readings at her health care appointments and not checking as much at home, pt does not follow a special diet. Patient has had 2 falls with injury this past year (broken toes)  Planned Interventions: Evaluation of current treatment plan related to hypertension self management and patient's adherence to plan as established by provider;   Provided education to patient re: stroke prevention, s/s of heart attack and stroke; Reviewed prescribed diet low sodium Reviewed medications with patient and discussed importance of compliance;  Discussed plans with patient for ongoing care management follow up and provided patient with direct contact information for care management team; Advised patient, providing education and rationale, to monitor blood pressure daily and record, calling PCP for findings outside established parameters;  Provided education on prescribed diet low sodium;  Discussed complications of poorly controlled blood pressure such as heart disease, stroke, circulatory complications, vision complications, kidney impairment, sexual dysfunction;  Reinforced safety precautions  Symptom Management: Take medications as prescribed   Attend all scheduled provider appointments Call pharmacy for medication refills 3-7 days in advance of running out of medications Attend church or other social activities Call provider office for new concerns or questions  check blood pressure 3 times per  week write blood pressure results in a log or diary keep a blood pressure log take blood pressure log to all doctor appointments call doctor for signs and symptoms of high blood pressure keep all doctor appointments take medications for blood pressure exactly as prescribed report new symptoms to your doctor eat more whole grains, fruits and vegetables, lean meats and healthy fats Follow low sodium diet- limit fast food, read labels fall prevention strategies: change position slowly, use assistive device such as walker or cane (per provider recommendations) when walking, keep walkways clear, have good lighting in room. It is important to contact your provider if you have any falls, maintain muscle strength/tone by exercise per provider recommendations.  Follow Up Plan: Telephone follow up appointment with care management team member scheduled for: 04/04/22 at 130 pm          Patient verbalizes understanding of instructions and care plan provided today and agrees to view in [place]. Active MyChart status and patient understanding of how to access instructions and care plan via MyChart confirmed with patient.     Telephone follow up appointment with care management team member scheduled for:  04/04/22 at 130 pm

## 2022-02-07 NOTE — Chronic Care Management (AMB) (Signed)
Chronic Care Management   CCM RN Visit Note  02/07/2022 Name: [name] MRN: [id number] DOB: 1955/10/25  Subjective: [name] is a 67 y.o. year old female who is a primary care patient of Allwardt, Alyssa M, PA-C. The patient was referred to the Chronic Care Management team for assistance with care management needs subsequent to provider initiation of CCM services and plan of care.    Today's Visit:  Engaged with patient by telephone for follow up visit.        Goals Addressed             This Visit's Progress    CCM (COPD) EXPECTED OUTCOME: MONITOR, SELF-MANAGE AND REDUCE SYMPTOMS OF COPD       Current Barriers:  Knowledge Deficits related to COPD management Chronic Disease Management support and education needs related to COPD Patient reports she lives with her spouse and he assists with cooking, housekeeping, and laundry, pt continues to drive, has walker to use as needed, has built in shower seat. Patient reports she continues to smoke 2 ppd and not interested in smoking cessation Patient continues taking treatments for small cell lung cancer and states " I'll be taking these the rest of my life", next infusion due in February Depression PHQ-9=12, pt is on medication and has tried counseling in the past, states this did not work well for her and declines LCSW services Patient reports she tires out easily  Planned Interventions: Advised patient to track and manage COPD triggers Provided instruction about proper use of medications used for management of COPD including inhalers Advised patient to self assesses COPD action plan zone and make appointment with provider if in the yellow zone for 48 hours without improvement Advised patient to engage in light exercise as tolerated 3-5 days a week to aid in the the management of COPD Provided education about and advised patient to utilize infection prevention strategies to reduce risk of respiratory infection Discussed the  importance of adequate rest and management of fatigue with COPD Reviewed importance of relaxation and rest, energy conservation  Symptom Management: Take medications as prescribed   Attend all scheduled provider appointments Call pharmacy for medication refills 3-7 days in advance of running out of medications Attend church or other social activities Call provider office for new concerns or questions  identify and remove indoor air pollutants limit outdoor activity during cold weather listen for public air quality announcements every day do breathing exercises every day develop a rescue plan eliminate symptom triggers at home follow rescue plan if symptoms flare-up eat healthy/prescribed diet: low sodium, heart healthy get at least 7 to 8 hours of sleep at night use devices that will help like a cane, sock-puller or reacher Follow COPD action plan- call your doctor early on for change in symptoms or health status Continue practicing good handwashing, avoid sick people, wear a mask as needed Alternate activity with rest  Follow Up Plan: Telephone follow up appointment with care management team member scheduled for:  04/04/22 at 130 pm       CCM (HYPERTENSION) EXPECTED OUTCOME: MONITOR, SELF-MANAGE AND REDUCE SYMPTOMS OF HYPERTENSION       Current Barriers:  Knowledge Deficits related to Hypertension management Chronic Disease Management support and education needs related to Hypertension Patient reports she checks blood pressure and readings are usually good, recently has been relying on readings at her health care appointments and not checking as much at home, pt does not follow a special diet. Patient has  had 2 falls with injury this past year (broken toes)  Planned Interventions: Evaluation of current treatment plan related to hypertension self management and patient's adherence to plan as established by provider;   Provided education to patient re: stroke prevention, s/s of heart  attack and stroke; Reviewed prescribed diet low sodium Reviewed medications with patient and discussed importance of compliance;  Discussed plans with patient for ongoing care management follow up and provided patient with direct contact information for care management team; Advised patient, providing education and rationale, to monitor blood pressure daily and record, calling PCP for findings outside established parameters;  Provided education on prescribed diet low sodium;  Discussed complications of poorly controlled blood pressure such as heart disease, stroke, circulatory complications, vision complications, kidney impairment, sexual dysfunction;  Reinforced safety precautions  Symptom Management: Take medications as prescribed   Attend all scheduled provider appointments Call pharmacy for medication refills 3-7 days in advance of running out of medications Attend church or other social activities Call provider office for new concerns or questions  check blood pressure 3 times per week write blood pressure results in a log or diary keep a blood pressure log take blood pressure log to all doctor appointments call doctor for signs and symptoms of high blood pressure keep all doctor appointments take medications for blood pressure exactly as prescribed report new symptoms to your doctor eat more whole grains, fruits and vegetables, lean meats and healthy fats Follow low sodium diet- limit fast food, read labels fall prevention strategies: change position slowly, use assistive device such as walker or cane (per provider recommendations) when walking, keep walkways clear, have good lighting in room. It is important to contact your provider if you have any falls, maintain muscle strength/tone by exercise per provider recommendations.  Follow Up Plan: Telephone follow up appointment with care management team member scheduled for: 04/04/22 at 130 pm          Plan:Telephone follow up  appointment with care management team member scheduled for:  04/04/22 at 130 pm  Jacqlyn Larsen [facility], BSN RN Case Manager [place] [facility] [employer] [phone]

## 2022-02-08 ENCOUNTER — Telehealth: Payer: Self-pay

## 2022-02-08 ENCOUNTER — Other Ambulatory Visit: Payer: Self-pay | Admitting: Hematology and Oncology

## 2022-02-08 ENCOUNTER — Other Ambulatory Visit: Payer: Self-pay | Admitting: Physician Assistant

## 2022-02-08 MED ORDER — DIPHENOXYLATE-ATROPINE 2.5-0.025 MG PO TABS: 1.0000 | ORAL_TABLET | Freq: Four times a day (QID) | ORAL | 0 refills | Status: DC | PRN

## 2022-02-08 NOTE — Telephone Encounter (Signed)
Patient called to report diarrhea. 6-8 loose stools daily. If she coughs she is incontinent of stool. Diarrhea starts 10 prior to treatment and lasts for a day. Patient taking Imodium as instructed by Claybon Jabs RN with no relief. Patient requesting prescription for different any-diarrheal medication.

## 2022-02-12 ENCOUNTER — Other Ambulatory Visit: Payer: Self-pay

## 2022-02-12 ENCOUNTER — Other Ambulatory Visit: Payer: Self-pay | Admitting: Hematology and Oncology

## 2022-02-13 ENCOUNTER — Inpatient Hospital Stay: Payer: 59

## 2022-02-13 ENCOUNTER — Encounter: Payer: Self-pay | Admitting: Hematology and Oncology

## 2022-02-13 ENCOUNTER — Other Ambulatory Visit (HOSPITAL_COMMUNITY): Payer: 59

## 2022-02-13 ENCOUNTER — Inpatient Hospital Stay: Payer: 59 | Attending: Hematology and Oncology | Admitting: Hematology and Oncology

## 2022-02-13 ENCOUNTER — Other Ambulatory Visit: Payer: Self-pay

## 2022-02-13 VITALS — BP 139/81 | HR 85 | Temp 98.4°F | Resp 16

## 2022-02-13 DIAGNOSIS — E785 Hyperlipidemia, unspecified: Secondary | ICD-10-CM | POA: Insufficient documentation

## 2022-02-13 DIAGNOSIS — R197 Diarrhea, unspecified: Secondary | ICD-10-CM

## 2022-02-13 DIAGNOSIS — Z95828 Presence of other vascular implants and grafts: Secondary | ICD-10-CM

## 2022-02-13 DIAGNOSIS — Z811 Family history of alcohol abuse and dependence: Secondary | ICD-10-CM | POA: Insufficient documentation

## 2022-02-13 DIAGNOSIS — C3490 Malignant neoplasm of unspecified part of unspecified bronchus or lung: Secondary | ICD-10-CM

## 2022-02-13 DIAGNOSIS — Z8261 Family history of arthritis: Secondary | ICD-10-CM | POA: Insufficient documentation

## 2022-02-13 DIAGNOSIS — Z880 Allergy status to penicillin: Secondary | ICD-10-CM | POA: Insufficient documentation

## 2022-02-13 DIAGNOSIS — G8929 Other chronic pain: Secondary | ICD-10-CM | POA: Insufficient documentation

## 2022-02-13 DIAGNOSIS — Z90710 Acquired absence of both cervix and uterus: Secondary | ICD-10-CM | POA: Diagnosis not present

## 2022-02-13 DIAGNOSIS — Z79899 Other long term (current) drug therapy: Secondary | ICD-10-CM | POA: Diagnosis not present

## 2022-02-13 DIAGNOSIS — E119 Type 2 diabetes mellitus without complications: Secondary | ICD-10-CM | POA: Insufficient documentation

## 2022-02-13 DIAGNOSIS — Z836 Family history of other diseases of the respiratory system: Secondary | ICD-10-CM | POA: Insufficient documentation

## 2022-02-13 DIAGNOSIS — J449 Chronic obstructive pulmonary disease, unspecified: Secondary | ICD-10-CM | POA: Diagnosis not present

## 2022-02-13 DIAGNOSIS — F32A Depression, unspecified: Secondary | ICD-10-CM | POA: Insufficient documentation

## 2022-02-13 DIAGNOSIS — F419 Anxiety disorder, unspecified: Secondary | ICD-10-CM | POA: Insufficient documentation

## 2022-02-13 DIAGNOSIS — M1990 Unspecified osteoarthritis, unspecified site: Secondary | ICD-10-CM | POA: Insufficient documentation

## 2022-02-13 DIAGNOSIS — Z90721 Acquired absence of ovaries, unilateral: Secondary | ICD-10-CM | POA: Diagnosis not present

## 2022-02-13 DIAGNOSIS — Z823 Family history of stroke: Secondary | ICD-10-CM | POA: Insufficient documentation

## 2022-02-13 DIAGNOSIS — Z885 Allergy status to narcotic agent status: Secondary | ICD-10-CM | POA: Diagnosis not present

## 2022-02-13 DIAGNOSIS — Z814 Family history of other substance abuse and dependence: Secondary | ICD-10-CM | POA: Insufficient documentation

## 2022-02-13 DIAGNOSIS — I10 Essential (primary) hypertension: Secondary | ICD-10-CM | POA: Insufficient documentation

## 2022-02-13 DIAGNOSIS — Z888 Allergy status to other drugs, medicaments and biological substances status: Secondary | ICD-10-CM | POA: Insufficient documentation

## 2022-02-13 DIAGNOSIS — Z8249 Family history of ischemic heart disease and other diseases of the circulatory system: Secondary | ICD-10-CM | POA: Insufficient documentation

## 2022-02-13 DIAGNOSIS — C3431 Malignant neoplasm of lower lobe, right bronchus or lung: Secondary | ICD-10-CM | POA: Insufficient documentation

## 2022-02-13 DIAGNOSIS — Z9049 Acquired absence of other specified parts of digestive tract: Secondary | ICD-10-CM | POA: Diagnosis not present

## 2022-02-13 DIAGNOSIS — F17210 Nicotine dependence, cigarettes, uncomplicated: Secondary | ICD-10-CM | POA: Diagnosis not present

## 2022-02-13 DIAGNOSIS — R1030 Lower abdominal pain, unspecified: Secondary | ICD-10-CM | POA: Insufficient documentation

## 2022-02-13 DIAGNOSIS — Z886 Allergy status to analgesic agent status: Secondary | ICD-10-CM | POA: Insufficient documentation

## 2022-02-13 DIAGNOSIS — Z8719 Personal history of other diseases of the digestive system: Secondary | ICD-10-CM | POA: Diagnosis not present

## 2022-02-13 DIAGNOSIS — Z825 Family history of asthma and other chronic lower respiratory diseases: Secondary | ICD-10-CM | POA: Insufficient documentation

## 2022-02-13 DIAGNOSIS — Z820 Family history of epilepsy and other diseases of the nervous system: Secondary | ICD-10-CM | POA: Insufficient documentation

## 2022-02-13 DIAGNOSIS — Z882 Allergy status to sulfonamides status: Secondary | ICD-10-CM | POA: Insufficient documentation

## 2022-02-13 LAB — CBC WITH DIFFERENTIAL (CANCER CENTER ONLY)
Abs Immature Granulocytes: 0.02 10*3/uL (ref 0.00–0.07)
Basophils Absolute: 0.1 10*3/uL (ref 0.0–0.1)
Basophils Relative: 1 %
Eosinophils Absolute: 0.6 10*3/uL — ABNORMAL HIGH (ref 0.0–0.5)
Eosinophils Relative: 7 %
HCT: 38.9 % (ref 36.0–46.0)
Hemoglobin: 12.5 g/dL (ref 12.0–15.0)
Immature Granulocytes: 0 %
Lymphocytes Relative: 34 %
Lymphs Abs: 2.6 10*3/uL (ref 0.7–4.0)
MCH: 28.3 pg (ref 26.0–34.0)
MCHC: 32.1 g/dL (ref 30.0–36.0)
MCV: 88.2 fL (ref 80.0–100.0)
Monocytes Absolute: 0.8 10*3/uL (ref 0.1–1.0)
Monocytes Relative: 10 %
Neutro Abs: 3.7 10*3/uL (ref 1.7–7.7)
Neutrophils Relative %: 48 %
Platelet Count: 164 10*3/uL (ref 150–400)
RBC: 4.41 MIL/uL (ref 3.87–5.11)
RDW: 18 % — ABNORMAL HIGH (ref 11.5–15.5)
WBC Count: 7.7 10*3/uL (ref 4.0–10.5)
nRBC: 0 % (ref 0.0–0.2)

## 2022-02-13 LAB — C DIFFICILE QUICK SCREEN W PCR REFLEX
C Diff antigen: NEGATIVE
C Diff interpretation: NOT DETECTED
C Diff toxin: NEGATIVE

## 2022-02-13 LAB — CMP (CANCER CENTER ONLY)
ALT: 8 U/L (ref 0–44)
AST: 11 U/L — ABNORMAL LOW (ref 15–41)
Albumin: 3.3 g/dL — ABNORMAL LOW (ref 3.5–5.0)
Alkaline Phosphatase: 75 U/L (ref 38–126)
Anion gap: 6 (ref 5–15)
BUN: 8 mg/dL (ref 8–23)
CO2: 31 mmol/L (ref 22–32)
Calcium: 8.5 mg/dL — ABNORMAL LOW (ref 8.9–10.3)
Chloride: 99 mmol/L (ref 98–111)
Creatinine: 0.91 mg/dL (ref 0.44–1.00)
GFR, Estimated: >60 mL/min (ref >60)
Glucose, Bld: 89 mg/dL (ref 70–99)
Potassium: 3.2 mmol/L — ABNORMAL LOW (ref 3.5–5.1)
Sodium: 136 mmol/L (ref 135–145)
Total Bilirubin: 0.3 mg/dL (ref 0.3–1.2)
Total Protein: 5.7 g/dL — ABNORMAL LOW (ref 6.5–8.1)

## 2022-02-13 LAB — TSH: TSH: 0.661 u[IU]/mL (ref 0.350–4.500)

## 2022-02-13 MED ORDER — MORPHINE SULFATE (PF) 4 MG/ML IV SOLN
4.0000 mg | Freq: Once | INTRAVENOUS | Status: AC
  Administered 2022-02-13: 4 mg via INTRAVENOUS
  Filled 2022-02-13: qty 1

## 2022-02-13 MED ORDER — SODIUM CHLORIDE 0.9% FLUSH
10.0000 mL | Freq: Once | INTRAVENOUS | Status: AC
  Administered 2022-02-13: 10 mL

## 2022-02-13 MED ORDER — PROCHLORPERAZINE MALEATE 10 MG PO TABS
10.0000 mg | ORAL_TABLET | Freq: Once | ORAL | Status: AC
  Administered 2022-02-13: 10 mg via ORAL
  Filled 2022-02-13: qty 1

## 2022-02-13 MED ORDER — HEPARIN SOD (PORK) LOCK FLUSH 100 UNIT/ML IV SOLN
500.0000 [IU] | Freq: Once | INTRAVENOUS | Status: AC
  Administered 2022-02-13: 500 [IU]

## 2022-02-13 MED ORDER — SODIUM CHLORIDE 0.9 % IV SOLN: Freq: Once | INTRAVENOUS | Status: AC

## 2022-02-13 NOTE — Patient Instructions (Signed)

## 2022-02-13 NOTE — Progress Notes (Signed)
[facility] [phone]) [phone]   [phone]) [phone]  PROGRESS NOTE  Patient Care Team: Allwardt, Crist Infante, PA-C as PCP - General (Physician Assistant) Rachael Fee, MD as Attending Physician (Gastroenterology) Dr. Louie Bun, MD as Consulting Physician (Orthopedic Surgery) Ranee Gosselin, MD as Consulting Physician (Orthopedic Surgery) York Spaniel, MD (Inactive) as Consulting Physician (Neurology) Smitty Cords, OD as Consulting Physician (Optometry) Karel Jarvis Lesle Chris, MD as Consulting Physician (Neurology) Erroll Luna, [facility] (Pharmacist) Audrie Gallus, RN as [facility] Care Management  Hematological/Oncological History # Small Cell Lung Cancer, Extensive Stage 07/05/2020: CT abdomen for lower abdominal pain. New pulmonary nodular density noted 07/06/2020: CT chest showed 2.2 cm macrolobulated right lower lobe pulmonary nodule (favored) versus pathologically enlarged infrahilar lymph node 10/13/2020: PET CT scan performed, findings show 2 cm right lower lobe lung mass is hypermetabolic and consistent with primary lung neoplasm. Additionally found hypermetabolic 17 mm soft tissue lesion between the descending duodenum and the pancreatic head  10/19/2020: EGD to biopsy hypermetabolic lymph node. Biopsy results show small cell lung cancer 10/26/2020: establish care with Dr. Leonides Schanz  11/13/2020: Cycle 1 Day 1 of Carbo/Etop/Atezolizumab 12/04/2020: Cycle 2 Day 1 of Carbo/Etop/Atezolizumab 11/22-11/25/2022: admitted for E. Coli bacteremia/sepsis. Start of Cycle 3 delayed. 01/02/2021: Cycle 3 Day 1 of Carbo/Etop/Atezolizumab 01/23/2021: Cycle 4 Day 1 of Carbo/Etop/Atezolizumab 02/22/2021: Cycle 5 Day 1 of Atezolizumab Maintenance. Delayed start due to patient's COVID infection.  03/14/2021: Cycle 6 Day 1 of Atezolizumab Maintenance 04/05/2021: Cycle 7 Day 1 of Atezolizumab Maintenance 04/25/2021: Cycle 8 Day 1 of Atezolizumab  Maintenance 05/16/2021: Cycle 9 Day 1 of Atezolizumab Maintenance 06/06/2021: Cycle 10 Day 1 of Atezolizumab Maintenance 06/27/2021:  Cycle 11 Day 1 of Atezolizumab Maintenance 07/18/2021: Cycle 12 Day 1 of Atezolizumab Maintenance 08/08/2021: Cycle 13 Day 1 of Atezolizumab Maintenance 08/29/2021: Cycle 14 Day 1 of Atezolizumab Maintenance 09/19/2021: Cycle 15 Day 1 of Atezolizumab Maintenance 10/10/2021: Cycle 16 Day 1 of Atezolizumab Maintenance 10/31/2021: Cycle 17 Day 1 of Atezolizumab Maintenance 11/21/2021: Cycle 18 Day 1 of Atezolizumab Maintenance 12/12/2021: Cycle 19 Day 1 of Atezolizumab Maintenance 01/02/2022: Cycle 20 Day 1 of Atezolizumab Maintenance 01/23/2022: Cycle 21 Day 1 of Atezolizumab Maintenance 02/13/2022: treatment HELD due to diarrhea.   Interval History:  [name] 67 y.o. female with medical history significant for extensive stage small cell lung cancer who presents for a follow up visit. The patient's last visit was on 01/23/2022. In the interim since the last visit she has completed cycle 21 of chemotherapy. She presents today to start cycle 22 of chemotherapy.  On exam today [name] reports she is having at least 6 bowel movements today which consist of "mushy".  She notes it is not quite watery but it is very very loose.  She notes it is foul-smelling and she is having a lot of gas as well.  She notes when she tries to wipe it it only smears.  She did collect a stool sample in a Tupperware container but unfortunate the lab will not except that today.  She notes that she has not had any unusual changes in her diet.  Her bowel movements got progressively worse starting on Monday.  She reports that she just completed her course of azithromycin on Sunday.  She is having lower abdominal pain described as a "throbbing pain".  She is not having any other symptoms such as fevers, chills, sweats, nausea, vomiting or blood in the stool.  She is doing her  best to try to keep up  with hydration.  She has been eating well.  A full 10 point ROS is listed below.  Today we will HOLD treatment in the setting of possible infectious diarrhea.     MEDICAL HISTORY:  Past Medical History:  Diagnosis Date   Allergic rhinitis    Anemia    Anxiety    Chicken pox    Chronic back pain    COPD (chronic obstructive pulmonary disease) (HCC)    Depression    DM (diabetes mellitus) (HCC)    Essential hypertension    GERD (gastroesophageal reflux disease)    Headache    migraines   History of gastritis    EGD 2015   History of home oxygen therapy    2 liters at hs last 6 months   Hyperlipidemia    Hypothyroidism    Migraines    Osteoarthritis    oa   Scoliosis     SURGICAL HISTORY: Past Surgical History:  Procedure Laterality Date   APPENDECTOMY     1985   BIOPSY  07/24/2017   Procedure: BIOPSY;  Surgeon: Rachael Fee, MD;  Location: [facility];  Service: Endoscopy;;   CARDIAC CATHETERIZATION N/A 10/31/2015   Procedure: Left Heart Cath and Coronary Angiography;  Surgeon: Marykay Lex, MD;  Location: [facility]AS[facility] CV LAB;  Service: Cardiovascular;  Laterality: N/A;   CARPAL TUNNEL RELEASE Left    CARPAL TUNNEL RELEASE Right    CHOLECYSTECTOMY  late 1980's   COLONOSCOPY WITH PROPOFOL N/A 07/24/2017   Procedure: COLONOSCOPY WITH PROPOFOL;  Surgeon: Rachael Fee, MD;  Location: [facility];  Service: Endoscopy;  Laterality: N/A;   ESOPHAGOGASTRODUODENOSCOPY N/A 07/24/2017   Procedure: ESOPHAGOGASTRODUODENOSCOPY (EGD);  Surgeon: Rachael Fee, MD;  Location: [facility];  Service: Endoscopy;  Laterality: N/A;   ESOPHAGOGASTRODUODENOSCOPY (EGD) WITH PROPOFOL N/A 10/19/2020   Procedure: ESOPHAGOGASTRODUODENOSCOPY (EGD) WITH PROPOFOL;  Surgeon: Rachael Fee, MD;  Location: [facility];  Service: Endoscopy;  Laterality: N/A;   FINE NEEDLE ASPIRATION N/A 10/19/2020   Procedure: FINE NEEDLE ASPIRATION (FNA) LINEAR;  Surgeon: Rachael Fee, MD;  Location:  [facility];  Service: Endoscopy;  Laterality: N/A;   GALLBLADDER SURGERY  1991   HIP CLOSED REDUCTION Right 01/08/2016   Procedure: CLOSED MANIPULATION HIP;  Surgeon: Jene Every, MD;  Location: [facility];  Service: Orthopedics;  Laterality: Right;   HIP CLOSED REDUCTION Right 01/19/2016   Procedure: ATTEMPTED CLOSED REDUCTION RIGHT HIP;  Surgeon: Toni Arthurs, MD;  Location: [facility];  Service: Orthopedics;  Laterality: Right;   HIP CLOSED REDUCTION Right 01/20/2016   Procedure: CLOSED REDUCTION RIGHT TOTAL HIP;  Surgeon: Durene Romans, MD;  Location: [facility];  Service: Orthopedics;  Laterality: Right;   HIP CLOSED REDUCTION Right 02/17/2016   Procedure: CLOSED REDUCTION RIGHT TOTAL HIP;  Surgeon: Samson Frederic, MD;  Location: [facility];  Service: Orthopedics;  Laterality: Right;   HIP CLOSED REDUCTION Right 02/28/2016   Procedure: CLOSED REDUCTION HIP;  Surgeon: Yolonda Kida, MD;  Location: [facility];  Service: Orthopedics;  Laterality: Right;   IR IMAGING GUIDED PORT INSERTION  11/01/2020   POLYPECTOMY  07/24/2017   Procedure: POLYPECTOMY;  Surgeon: Rachael Fee, MD;  Location: [facility];  Service: Endoscopy;;   TONSILLECTOMY     TOTAL ABDOMINAL HYSTERECTOMY     1985, with 1 ovary removed and 2 nd ovary removed 2003   TOTAL HIP ARTHROPLASTY Right    Original surgery 2006 with revision 2010  TOTAL HIP REVISION Right 01/01/2016   Procedure: TOTAL HIP REVISION;  Surgeon: Durene Romans, MD;  Location: [facility];  Service: Orthopedics;  Laterality: Right;   TOTAL HIP REVISION Right 03/02/2016   Procedure: TOTAL HIP REVISION;  Surgeon: Durene Romans, MD;  Location: [facility];  Service: Orthopedics;  Laterality: Right;   TOTAL HIP REVISION Right 09/02/2016   Procedure: Right hip constrained liner- posterior;  Surgeon: Durene Romans, MD;  Location: [facility];  Service: Orthopedics;  Laterality: Right;   ULNAR NERVE TRANSPOSITION Right    UPPER ESOPHAGEAL ENDOSCOPIC ULTRASOUND (EUS) N/A 10/19/2020    Procedure: UPPER ESOPHAGEAL ENDOSCOPIC ULTRASOUND (EUS);  Surgeon: Rachael Fee, MD;  Location: [facility];  Service: Endoscopy;  Laterality: N/A;  periduodenal lesion    SOCIAL HISTORY: Social History   Socioeconomic History   Marital status: Married    Spouse name: Not on file   Number of children: 2   Years of education: Not on file   Highest education level: Not on file  Occupational History   Occupation: disabled   Occupation: disabled  Tobacco Use   Smoking status: Every Day    Packs/day: 2.00    Years: 46.00    Total pack years: 92.00    Types: Cigarettes   Smokeless tobacco: Never   Tobacco comments:    2 packs of cigarettes smoked daily 12/13/21- declines smoking cessation  Vaping Use   Vaping Use: Never used  Substance and Sexual Activity   Alcohol use: No   Drug use: No   Sexual activity: Not Currently    Partners: Male  Other Topics Concern   Not on file  Social History Narrative   Right handed    Caffeine~ 2 cups per day    Lives at home with husband (strained relationship)   Primary caretaker for disabled brother who had aneurism   Daughter died June 29, 2018   Social Determinants of Health   Financial Resource Strain: Low Risk  (12/13/2021)   Overall Financial Resource Strain (CARDIA)    Difficulty of Paying Living Expenses: Not very hard  Food Insecurity: No Food Insecurity (12/13/2021)   Hunger Vital Sign    Worried About Running Out of Food in the Last Year: Never true    Ran Out of Food in the Last Year: Never true  Transportation Needs: No Transportation Needs (12/13/2021)   PRAPARE - Administrator, Civil Service (Medical): No    Lack of Transportation (Non-Medical): No  Physical Activity: Inactive (12/13/2021)   Exercise Vital Sign    Days of Exercise per Week: 0 days    Minutes of Exercise per Session: 0 min  Stress: Stress Concern Present (12/13/2021)   [employer] of Occupational Health - Occupational Stress  Questionnaire    Feeling of Stress : To some extent  Social Connections: Moderately Integrated (12/13/2021)   Social Connection and Isolation Panel [NHANES]    Frequency of Communication with Friends and Family: Twice a week    Frequency of Social Gatherings with Friends and Family: Once a week    Attends Religious Services: 1 to 4 times per year    Active Member of [employer] or Organizations: No    Attends Banker Meetings: Never    Marital Status: Married  Catering manager Violence: Not At Risk (05/03/2021)   Humiliation, Afraid, Rape, and Kick questionnaire    Fear of Current or Ex-Partner: No    Emotionally Abused: No    Physically Abused: No  Sexually Abused: No    FAMILY HISTORY: Family History  Problem Relation Age of Onset   COPD Mother    Heart disease Mother    Lung disease Father        Asbestosis   Heart attack Father    Heart disease Father    Cerebral aneurysm Brother    Aneurysm Brother        Brain   Drug abuse Daughter    Epilepsy Son    Alcohol abuse Son    Drug abuse Son    Arthritis Maternal Grandmother    Heart disease Maternal Grandmother    Asthma Maternal Grandfather    Cancer Maternal Grandfather    Arthritis Paternal Grandmother    Heart disease Paternal Grandmother    Stroke Paternal Grandmother    Early death Paternal Grandfather    Heart disease Paternal Grandfather     ALLERGIES:  is allergic to metformin and related, nsaids, wellbutrin [bupropion], aleve [naproxen sodium], codeine, penicillins, and sulfonamide derivatives.  MEDICATIONS:  Current Outpatient Medications  Medication Sig Dispense Refill   albuterol (PROAIR HFA) 108 (90 Base) MCG/ACT inhaler 2 puffs every 4 hours as needed only  if your can't catch your breath 18 g 3   ALPRAZolam (XANAX) 1 MG tablet TAKE 1 TABLET BY MOUTH THREE TIMES DAILY AS NEEDED FOR ANXIETY 90 tablet 2   atorvastatin (LIPITOR) 20 MG tablet TAKE 1 TABLET BY MOUTH AT BEDTIME 90 tablet 1    azithromycin (ZITHROMAX Z-PAK) 250 MG tablet Take 2 tablets on day 1 and then 1 daily for next 4 days (Patient not taking: Reported on 02/07/2022) 6 each 0   benzonatate (TESSALON) 100 MG capsule TAKE 1 CAPSULE BY MOUTH THREE TIMES DAILY 30 capsule 1   Budeson-Glycopyrrol-Formoterol (BREZTRI AEROSPHERE) 160-9-4.8 MCG/ACT AERO Inhale 2 puffs into the lungs in the morning and at bedtime. 10.7 g 3   diphenoxylate-atropine (LOMOTIL) 2.5-0.025 MG tablet Take 1 tablet by mouth 4 (four) times daily as needed for diarrhea or loose stools. 60 tablet 0   DULoxetine (CYMBALTA) 60 MG capsule Take 2 capsules (120 mg total) by mouth daily. 180 capsule 1   fluticasone (FLONASE) 50 MCG/ACT nasal spray INSTILL 2 SPRAYS IN EACH NOSTRIL EVERY DAY 16 g 6   furosemide (LASIX) 20 MG tablet TAKE 1 TABLET BY MOUTH TWICE DAILY. If swelling improves can hold medication. 90 tablet 0   HYDROcodone-acetaminophen (NORCO) 10-325 MG tablet TAKE 1 TABLET BY MOUTH EVERY 4 HOURS AS NEEDED. MAX OF FIVE TABLETS DAILY 150 tablet 0   levothyroxine (SYNTHROID) 137 MCG tablet Take 1 tablet (137 mcg total) by mouth daily before breakfast. 90 tablet 1   loratadine (CLARITIN) 10 MG tablet Take 10 mg by mouth daily.     meloxicam (MOBIC) 15 MG tablet TAKE 1 TABLET BY MOUTH EVERY DAY 30 tablet 2   OLANZapine (ZYPREXA) 10 MG tablet Take 1 tablet (10 mg total) by mouth at bedtime. 30 tablet 2   omeprazole (PRILOSEC) 40 MG capsule TAKE ONE CAPSULE BY MOUTH TWICE DAILY 60 capsule 0   ondansetron (ZOFRAN) 8 MG tablet Take 1 tablet (8 mg total) by mouth 2 (two) times daily. 60 tablet 1   oxyCODONE (ROXICODONE) 5 MG immediate release tablet Take 1 tablet (5 mg total) by mouth every 4 (four) hours as needed for severe pain. Goal - to replace Norco- max 5 tabs/day 150 tablet 0   potassium chloride SA (KLOR-CON M) 20 MEQ tablet Take 1 tablet (20 mEq  total) by mouth 2 (two) times daily. 60 tablet 2   prochlorperazine (COMPAZINE) 10 MG tablet Take 1 tablet  (10 mg total) by mouth every 6 (six) hours as needed for nausea or vomiting. 30 tablet 0   triamterene-hydrochlorothiazide (MAXZIDE-25) 37.5-25 MG tablet TAKE 1 TABLET BY MOUTH DAILY 90 tablet 2   No current facility-administered medications for this visit.    REVIEW OF SYSTEMS:   Constitutional: ( - ) fevers, ( - )  chills , ( - ) night sweats Eyes: ( - ) blurriness of vision, ( - ) double vision, ( - ) watery eyes Ears, nose, mouth, throat, and face: ( - ) mucositis, ( - ) sore throat Respiratory: ( - ) cough, ( +) dyspnea, ( - ) wheezes Cardiovascular: ( - ) palpitation, ( - ) chest discomfort, ( + ) lower extremity swelling Gastrointestinal:  ( -) nausea, ( - ) heartburn, ( - ) change in bowel habits Skin: ( - ) abnormal skin rashes Lymphatics: ( - ) new lymphadenopathy, ( - ) easy bruising Neurological: (+ ) numbness, ( - ) tingling, ( - ) new weaknesses Behavioral/Psych: ( - ) mood change, ( - ) new changes  All other systems were reviewed with the patient and are negative.  PHYSICAL EXAMINATION: ECOG PERFORMANCE STATUS: 1 - Symptomatic but completely ambulatory  There were no vitals filed for this visit.  There were no vitals filed for this visit.   GENERAL: Well-appearing middle-age Caucasian female, alert, no distress and comfortable SKIN: skin color, texture, turgor are normal, no rashes or significant lesions EYES: conjunctiva are pink and non-injected, sclera clear LUNGS:  normal breathing effort. Diffuse wheezing hear on ausculation.  HEART: regular rate & rhythm and no murmurs. Mild bilateral lower extremity edema Musculoskeletal: no cyanosis of digits and no clubbing  PSYCH: alert & oriented x 3, fluent speech NEURO: no focal motor/sensory deficits  LABORATORY DATA:  I have reviewed the data as listed    Latest Ref Rng & Units 02/13/2022    7:54 AM 01/23/2022    9:51 AM 01/02/2022   10:57 AM  CBC  WBC 4.0 - 10.5 K/uL 7.7  9.3  9.5   Hemoglobin 12.0 - 15.0  g/dL 12.5  12.3  12.2   Hematocrit 36.0 - 46.0 % 38.9  38.5  38.0   Platelets 150 - 400 K/uL 164  172  191        Latest Ref Rng & Units 02/13/2022    7:54 AM 01/23/2022    9:51 AM 01/02/2022   10:57 AM  CMP  Glucose 70 - 99 mg/dL 89  109  100   BUN 8 - 23 mg/dL 8  6  12    Creatinine 0.44 - 1.00 mg/dL 0.91  0.86  1.07   Sodium 135 - 145 mmol/L 136  136  136   Potassium 3.5 - 5.1 mmol/L 3.2  4.2  3.8   Chloride 98 - 111 mmol/L 99  101  102   CO2 22 - 32 mmol/L 31  32  28   Calcium 8.9 - 10.3 mg/dL 8.5  8.6  9.1   Total Protein 6.5 - 8.1 g/dL 5.7  6.3  6.4   Total Bilirubin 0.3 - 1.2 mg/dL 0.3  0.3  0.5   Alkaline Phos 38 - 126 U/L 75  80  89   AST 15 - 41 U/L 11  13  13    ALT 0 - 44 U/L 8  9  6     No results found for: "MPROTEIN" Lab Results  Component Value Date   KPAFRELGTCHN 0.75 06/30/2014   LAMBDASER 3.78 (H) 06/30/2014   KAPLAMBRATIO 0.20 (L) 06/30/2014     RADIOGRAPHIC STUDIES: DG Chest 2 View  Result Date: 01/27/2022 CLINICAL DATA:  h/o small cell lung cancer. new persistent cough with yellow secretions EXAM: CHEST - 2 VIEW COMPARISON:  Chest x-ray 08/17/2021 FINDINGS: Right chest wall Port-A-Cath with tip overlying the superior cavoatrial junction. The heart and mediastinal contours are unchanged. Aortic calcification. No focal consolidation. No pulmonary edema. No pleural effusion. No pneumothorax. No acute osseous abnormality. IMPRESSION: 1. No active cardiopulmonary disease. 2.  Aortic Atherosclerosis (ICD10-I70.0). Electronically Signed   By: Tish Frederickson M.D.   On: 01/27/2022 23:47   CT CHEST ABDOMEN PELVIS W CONTRAST  Result Date: 01/23/2022 CLINICAL DATA:  Follow-up small cell lung cancer * Tracking Code: BO * EXAM: CT CHEST, ABDOMEN, AND PELVIS WITH CONTRAST TECHNIQUE: Multidetector CT imaging of the chest, abdomen and pelvis was performed following the standard protocol during bolus administration of intravenous contrast. RADIATION DOSE REDUCTION: This  exam was performed according to the departmental dose-optimization program which includes automated exposure control, adjustment of the mA and/or kV according to patient size and/or use of iterative reconstruction technique. CONTRAST:  OMNIPAQUE IOHEXOL 300 MG/ML  SOLN COMPARISON:  CT 10/27/2021 FINDINGS: CT CHEST FINDINGS Cardiovascular: Coronary artery calcification and aortic atherosclerotic calcification. Insert port Mediastinum/Nodes: No axillary or supraclavicular adenopathy. No mediastinal or hilar adenopathy. No pericardial fluid. Esophagus normal. Lungs/Pleura: No suspicious pulmonary nodules. Normal pleural. Airways normal. Musculoskeletal: No aggressive osseous lesion. CT ABDOMEN PELVIS FINDINGS Hepatobiliary: No focal hepatic lesion. Postcholecystectomy. No biliary dilatation. Pancreas: Pancreas is normal. No ductal dilatation. No pancreatic inflammation. Spleen: Normal spleen Adrenals/urinary tract: Small LEFT adrenal nodule measures 15 mm unchanged (image 60/2). Kidneys normal Stomach/Bowel: Stomach, small bowel, appendix, and cecum are normal. Multiple diverticula of the descending colon and sigmoid colon without acute inflammation. Vascular/Lymphatic: Abdominal aorta is normal caliber with atherosclerotic calcification. There is no retroperitoneal or periportal lymphadenopathy. No pelvic lymphadenopathy. Reproductive: Post hysterectomy.  Adnexa unremarkable Other: No peritoneal metastasis Musculoskeletal: No aggressive osseous lesion. Arthropathy at the LEFT hip joint. RIGHT hip replacement. IMPRESSION: No evidence of small cell lung cancer recurrence on chest abdomen pelvis CT scan Coronary artery calcification and Aortic Atherosclerosis (ICD10-I70.0). Electronically Signed   By: Genevive Bi M.D.   On: 01/23/2022 16:38    ASSESSMENT & PLAN [name] 67 y.o. female with medical history significant for extensive stage small cell lung cancer who presents for a follow up visit.    After review of the labs, review of the records, and discussion with the patient the patients findings are most consistent with extensive stage small cell lung cancer with metastasis from the right lower lobe to the lymph nodes of the abdomen.  At this time we will pursue triple therapy with carboplatin, etoposide, and atezolizumab.  After 4 cycles we will convert to maintenance atezolizumab alone.  We previously discussed the risks and benefits of this therapy and the patient was in agreement to proceed with this treatment.  The treatment of choice consist of carboplatin, etoposide, and atezolizumab.  The regimen consists of carboplatin AUC of 5 IV on day 1, etoposide 100 mg per metered squared IV on day 1, 2, and 3 and atezolizumab 1200 mg on day 1.  This continues for 21-day cycles.  After 4 cycles the patient proceeds with atezolizumab maintenance  therapy alone.   #Diarrhea --patient has been having several days of loose watery foul smelling stools --not responsive to immodium/lomotil.  --receiving IV fluids today --stool sent for C. Diff and GI pathology panel --HOLD immunotheray today  # Small Cell Lung Cancer, Extensive Stage -- MRI of the brain shows no evidence of intracranial spread --Findings are currently consistent with metastatic small cell lung cancer with metastatic spread to the lymph nodes of the abdomen --Prognosis is poor with anticipated life span of less than 12 months -- Patient has extensive stage due to metastatic spread to lymph nodes in the abdomen --Plan to proceed with carboplatin, etoposide, and atezolizumab --will consider prophylactic cranial radiation after start of chemotherapy.  Plan: --today is Cycle 22 Day 1 of Atezolizumab maintenance, but will HOLD due to diarrhea.  --labs today were reviewed and adequate for treatment. Hgb 12.5, white blood cell 7.7, MCV 88.2, and platelets of 164. --CT scan q 3 months. Last scan in Dec 2023 showed continued excellent  response to therapy. CT scan next in March 2024.  --RTC in 3 weeks for next cycle of maintenance atezolizumab   #Supportive Care -- chemotherapy education complete -- port placed -- zofran 8mg  q8H PRN and compazine 10mg  PO q6H for nausea -- EMLA cream for port -- Will add mirtazapine in order to help with appetite/sleep  #Nausea: --Symptoms improved with zofran and olanzapine.  --Added IV zofran to infusion premeds for better symptom control.   #Hypokalemia:  --currently taking potassium chloride 20 mEq PO daily, increase to 20 meq BID --potassium level is still 3.2 today. Continue PO supplementation.   #Lower extremity edema-worsening --Currently takes lasix 20 mg daily, recommending increasing to q 6 hours x 2 doses daily.  --Improving. Continue to monitor.   #Neuropathy involving fingers/feet: --Patient currently takes Cymbalta   Orders Placed This Encounter  Procedures   C difficile quick screen w PCR reflex    Standing Status:   Future    Number of Occurrences:   1    Standing Expiration Date:   02/14/2023   All questions were answered. The patient knows to call the clinic with any problems, questions or concerns.  I have spent a total of 30 minutes minutes of face-to-face and non-face-to-face time, preparing to see the patient, performing a medically appropriate examination, counseling and educating the patient, ordering medications/tests, documenting clinical information in the electronic health record, and care coordination.   Ledell Peoples, MD Department of Hematology/Oncology [place] [facility] Phone: [phone] Pager: [phone] Email: [email or web address]  02/13/2022 10:08 AM

## 2022-02-14 ENCOUNTER — Telehealth: Payer: Self-pay

## 2022-02-14 LAB — GASTROINTESTINAL PANEL BY PCR, STOOL (REPLACES STOOL CULTURE)

## 2022-02-14 LAB — T4: T4, Total: 9.2 ug/dL (ref 4.5–12.0)

## 2022-02-14 NOTE — Telephone Encounter (Signed)
RN called patient to inform her of negative C.diff/ GI panel stool tests. No answer, left voicemail for patient to call [facility] back.

## 2022-02-15 ENCOUNTER — Telehealth: Payer: Self-pay

## 2022-02-15 NOTE — Telephone Encounter (Signed)
Spoke with patient regarding negative stool sample tests. Patient reports that she is still having diarrhea and is taking 2 lomotil instead of 1. Dr. Leonides Schanz notified of this is he reported this is fine for patient to do. He also advised that she can increase fiber intake or take metamucil. Patient informed of this and she verbalized understanding. Patient declined need for IV fluids at this time but was advised to call if she experiences symptoms of dehydration such as fatigue, dizziness or decreased urine output. All questions answered and patient has no other concerns at this time.

## 2022-02-16 ENCOUNTER — Other Ambulatory Visit: Payer: Self-pay | Admitting: Nurse Practitioner

## 2022-02-16 ENCOUNTER — Other Ambulatory Visit: Payer: Self-pay | Admitting: Hematology and Oncology

## 2022-02-16 DIAGNOSIS — R609 Edema, unspecified: Secondary | ICD-10-CM

## 2022-02-18 ENCOUNTER — Telehealth: Payer: Self-pay | Admitting: Nurse Practitioner

## 2022-02-18 MED ORDER — OXYCODONE HCL 10 MG PO TABS: 10.0000 mg | ORAL_TABLET | ORAL | 0 refills | Status: DC | PRN

## 2022-02-18 NOTE — Telephone Encounter (Signed)
Incoming call from patient states she is returning a call

## 2022-02-25 ENCOUNTER — Telehealth: Payer: Self-pay | Admitting: Physician Assistant

## 2022-02-25 NOTE — Telephone Encounter (Signed)
Last OV: 08/17/21  Next OV: none pt cancelled  Last filled: 11/27/21  Quantity: 90 w/ 2 refills

## 2022-02-27 ENCOUNTER — Other Ambulatory Visit: Payer: Self-pay | Admitting: Hematology and Oncology

## 2022-02-27 DIAGNOSIS — J449 Chronic obstructive pulmonary disease, unspecified: Secondary | ICD-10-CM

## 2022-02-27 DIAGNOSIS — F17210 Nicotine dependence, cigarettes, uncomplicated: Secondary | ICD-10-CM

## 2022-02-27 DIAGNOSIS — I10 Essential (primary) hypertension: Secondary | ICD-10-CM | POA: Diagnosis not present

## 2022-03-04 ENCOUNTER — Other Ambulatory Visit: Payer: Self-pay | Admitting: Hematology and Oncology

## 2022-03-04 ENCOUNTER — Other Ambulatory Visit: Payer: Self-pay | Admitting: Physician Assistant

## 2022-03-04 MED ORDER — DIPHENOXYLATE-ATROPINE 2.5-0.025 MG PO TABS: 1.0000 | ORAL_TABLET | Freq: Four times a day (QID) | ORAL | 0 refills | Status: DC | PRN

## 2022-03-04 MED ORDER — BENZONATATE 100 MG PO CAPS: 100.0000 mg | ORAL_CAPSULE | Freq: Three times a day (TID) | ORAL | 1 refills | Status: DC

## 2022-03-04 NOTE — Telephone Encounter (Signed)
Patient is requesting medication be sent in soon. She does have an OV on 03/11/22.

## 2022-03-05 NOTE — Telephone Encounter (Signed)
Pt states she has terminal cancer and can't wait til her appt to get and RX for Xanax. Please advise.

## 2022-03-06 ENCOUNTER — Encounter: Payer: Self-pay | Admitting: Physician Assistant

## 2022-03-06 ENCOUNTER — Ambulatory Visit (INDEPENDENT_AMBULATORY_CARE_PROVIDER_SITE_OTHER): Payer: 59 | Admitting: Physician Assistant

## 2022-03-06 ENCOUNTER — Inpatient Hospital Stay: Payer: 59 | Admitting: Dietician

## 2022-03-06 ENCOUNTER — Inpatient Hospital Stay (HOSPITAL_BASED_OUTPATIENT_CLINIC_OR_DEPARTMENT_OTHER): Payer: 59 | Admitting: Physician Assistant

## 2022-03-06 ENCOUNTER — Inpatient Hospital Stay: Payer: 59

## 2022-03-06 ENCOUNTER — Inpatient Hospital Stay: Payer: 59 | Attending: Hematology and Oncology

## 2022-03-06 VITALS — BP 136/72 | HR 89 | Temp 97.7°F | Resp 18 | Ht 64.0 in | Wt 195.8 lb

## 2022-03-06 VITALS — BP 120/78 | HR 92 | Temp 98.0°F | Resp 16 | Ht 62.5 in | Wt 195.0 lb

## 2022-03-06 DIAGNOSIS — Z79899 Other long term (current) drug therapy: Secondary | ICD-10-CM | POA: Diagnosis not present

## 2022-03-06 DIAGNOSIS — C3490 Malignant neoplasm of unspecified part of unspecified bronchus or lung: Secondary | ICD-10-CM

## 2022-03-06 DIAGNOSIS — Z79620 Long term (current) use of immunosuppressive biologic: Secondary | ICD-10-CM | POA: Insufficient documentation

## 2022-03-06 DIAGNOSIS — E119 Type 2 diabetes mellitus without complications: Secondary | ICD-10-CM | POA: Insufficient documentation

## 2022-03-06 DIAGNOSIS — Z90721 Acquired absence of ovaries, unilateral: Secondary | ICD-10-CM | POA: Insufficient documentation

## 2022-03-06 DIAGNOSIS — Z5112 Encounter for antineoplastic immunotherapy: Secondary | ICD-10-CM | POA: Diagnosis not present

## 2022-03-06 DIAGNOSIS — R232 Flushing: Secondary | ICD-10-CM | POA: Insufficient documentation

## 2022-03-06 DIAGNOSIS — R053 Chronic cough: Secondary | ICD-10-CM | POA: Insufficient documentation

## 2022-03-06 DIAGNOSIS — G629 Polyneuropathy, unspecified: Secondary | ICD-10-CM | POA: Diagnosis not present

## 2022-03-06 DIAGNOSIS — H3530 Unspecified macular degeneration: Secondary | ICD-10-CM | POA: Diagnosis not present

## 2022-03-06 DIAGNOSIS — M1990 Unspecified osteoarthritis, unspecified site: Secondary | ICD-10-CM | POA: Insufficient documentation

## 2022-03-06 DIAGNOSIS — F32A Depression, unspecified: Secondary | ICD-10-CM | POA: Insufficient documentation

## 2022-03-06 DIAGNOSIS — E1140 Type 2 diabetes mellitus with diabetic neuropathy, unspecified: Secondary | ICD-10-CM | POA: Diagnosis not present

## 2022-03-06 DIAGNOSIS — F419 Anxiety disorder, unspecified: Secondary | ICD-10-CM | POA: Diagnosis not present

## 2022-03-06 DIAGNOSIS — Z836 Family history of other diseases of the respiratory system: Secondary | ICD-10-CM | POA: Insufficient documentation

## 2022-03-06 DIAGNOSIS — Z8719 Personal history of other diseases of the digestive system: Secondary | ICD-10-CM | POA: Diagnosis not present

## 2022-03-06 DIAGNOSIS — Z882 Allergy status to sulfonamides status: Secondary | ICD-10-CM | POA: Insufficient documentation

## 2022-03-06 DIAGNOSIS — L03312 Cellulitis of back [any part except buttock]: Secondary | ICD-10-CM | POA: Diagnosis not present

## 2022-03-06 DIAGNOSIS — R600 Localized edema: Secondary | ICD-10-CM | POA: Diagnosis not present

## 2022-03-06 DIAGNOSIS — R200 Anesthesia of skin: Secondary | ICD-10-CM | POA: Insufficient documentation

## 2022-03-06 DIAGNOSIS — F17210 Nicotine dependence, cigarettes, uncomplicated: Secondary | ICD-10-CM | POA: Diagnosis not present

## 2022-03-06 DIAGNOSIS — I10 Essential (primary) hypertension: Secondary | ICD-10-CM | POA: Insufficient documentation

## 2022-03-06 DIAGNOSIS — K219 Gastro-esophageal reflux disease without esophagitis: Secondary | ICD-10-CM | POA: Diagnosis not present

## 2022-03-06 DIAGNOSIS — E785 Hyperlipidemia, unspecified: Secondary | ICD-10-CM | POA: Insufficient documentation

## 2022-03-06 DIAGNOSIS — J449 Chronic obstructive pulmonary disease, unspecified: Secondary | ICD-10-CM | POA: Insufficient documentation

## 2022-03-06 DIAGNOSIS — Z809 Family history of malignant neoplasm, unspecified: Secondary | ICD-10-CM | POA: Insufficient documentation

## 2022-03-06 DIAGNOSIS — Z885 Allergy status to narcotic agent status: Secondary | ICD-10-CM | POA: Insufficient documentation

## 2022-03-06 DIAGNOSIS — Z820 Family history of epilepsy and other diseases of the nervous system: Secondary | ICD-10-CM | POA: Insufficient documentation

## 2022-03-06 DIAGNOSIS — Z888 Allergy status to other drugs, medicaments and biological substances status: Secondary | ICD-10-CM | POA: Insufficient documentation

## 2022-03-06 DIAGNOSIS — Z8249 Family history of ischemic heart disease and other diseases of the circulatory system: Secondary | ICD-10-CM | POA: Insufficient documentation

## 2022-03-06 DIAGNOSIS — Z90710 Acquired absence of both cervix and uterus: Secondary | ICD-10-CM | POA: Insufficient documentation

## 2022-03-06 DIAGNOSIS — Z880 Allergy status to penicillin: Secondary | ICD-10-CM | POA: Diagnosis not present

## 2022-03-06 DIAGNOSIS — Z9049 Acquired absence of other specified parts of digestive tract: Secondary | ICD-10-CM | POA: Insufficient documentation

## 2022-03-06 DIAGNOSIS — R197 Diarrhea, unspecified: Secondary | ICD-10-CM | POA: Diagnosis not present

## 2022-03-06 DIAGNOSIS — Z8261 Family history of arthritis: Secondary | ICD-10-CM | POA: Insufficient documentation

## 2022-03-06 DIAGNOSIS — E876 Hypokalemia: Secondary | ICD-10-CM | POA: Diagnosis not present

## 2022-03-06 DIAGNOSIS — Z823 Family history of stroke: Secondary | ICD-10-CM | POA: Insufficient documentation

## 2022-03-06 DIAGNOSIS — R110 Nausea: Secondary | ICD-10-CM | POA: Diagnosis not present

## 2022-03-06 DIAGNOSIS — Z825 Family history of asthma and other chronic lower respiratory diseases: Secondary | ICD-10-CM | POA: Insufficient documentation

## 2022-03-06 DIAGNOSIS — C3431 Malignant neoplasm of lower lobe, right bronchus or lung: Secondary | ICD-10-CM | POA: Diagnosis not present

## 2022-03-06 DIAGNOSIS — Z886 Allergy status to analgesic agent status: Secondary | ICD-10-CM | POA: Insufficient documentation

## 2022-03-06 DIAGNOSIS — Z814 Family history of other substance abuse and dependence: Secondary | ICD-10-CM | POA: Insufficient documentation

## 2022-03-06 DIAGNOSIS — Z95828 Presence of other vascular implants and grafts: Secondary | ICD-10-CM

## 2022-03-06 LAB — TSH: TSH: 1.099 u[IU]/mL (ref 0.350–4.500)

## 2022-03-06 LAB — COMPREHENSIVE METABOLIC PANEL
ALT: 7 U/L (ref 0–44)
AST: 11 U/L — ABNORMAL LOW (ref 15–41)
Albumin: 3.5 g/dL (ref 3.5–5.0)
Alkaline Phosphatase: 69 U/L (ref 38–126)
Anion gap: 7 (ref 5–15)
BUN: 11 mg/dL (ref 8–23)
CO2: 29 mmol/L (ref 22–32)
Calcium: 8.5 mg/dL — ABNORMAL LOW (ref 8.9–10.3)
Chloride: 98 mmol/L (ref 98–111)
Creatinine, Ser: 0.93 mg/dL (ref 0.44–1.00)
GFR, Estimated: >60 mL/min (ref >60)
Glucose, Bld: 88 mg/dL (ref 70–99)
Potassium: 3.8 mmol/L (ref 3.5–5.1)
Sodium: 134 mmol/L — ABNORMAL LOW (ref 135–145)
Total Bilirubin: 0.3 mg/dL (ref 0.3–1.2)
Total Protein: 6.1 g/dL — ABNORMAL LOW (ref 6.5–8.1)

## 2022-03-06 LAB — CBC WITH DIFFERENTIAL/PLATELET
Abs Immature Granulocytes: 0.04 10*3/uL (ref 0.00–0.07)
Basophils Absolute: 0.1 10*3/uL (ref 0.0–0.1)
Basophils Relative: 1 %
Eosinophils Absolute: 0.4 10*3/uL (ref 0.0–0.5)
Eosinophils Relative: 5 %
HCT: 39.1 % (ref 36.0–46.0)
Hemoglobin: 12.5 g/dL (ref 12.0–15.0)
Immature Granulocytes: 0 %
Lymphocytes Relative: 31 %
Lymphs Abs: 2.9 10*3/uL (ref 0.7–4.0)
MCH: 28 pg (ref 26.0–34.0)
MCHC: 32 g/dL (ref 30.0–36.0)
MCV: 87.5 fL (ref 80.0–100.0)
Monocytes Absolute: 0.9 10*3/uL (ref 0.1–1.0)
Monocytes Relative: 10 %
Neutro Abs: 4.9 10*3/uL (ref 1.7–7.7)
Neutrophils Relative %: 53 %
Platelets: 184 10*3/uL (ref 150–400)
RBC: 4.47 MIL/uL (ref 3.87–5.11)
RDW: 16.9 % — ABNORMAL HIGH (ref 11.5–15.5)
WBC: 9.3 10*3/uL (ref 4.0–10.5)
nRBC: 0 % (ref 0.0–0.2)

## 2022-03-06 LAB — POCT GLYCOSYLATED HEMOGLOBIN (HGB A1C): Hemoglobin A1C: 5.9 % — AB (ref 4.0–5.6)

## 2022-03-06 MED ORDER — DOXYCYCLINE HYCLATE 100 MG PO TABS: 100.0000 mg | ORAL_TABLET | Freq: Two times a day (BID) | ORAL | 0 refills | Status: AC

## 2022-03-06 MED ORDER — SODIUM CHLORIDE 0.9 % IV SOLN
1200.0000 mg | Freq: Once | INTRAVENOUS | Status: AC
  Administered 2022-03-06: 1200 mg via INTRAVENOUS
  Filled 2022-03-06: qty 20

## 2022-03-06 MED ORDER — ONDANSETRON HCL 4 MG/2ML IJ SOLN
8.0000 mg | Freq: Once | INTRAMUSCULAR | Status: AC
  Administered 2022-03-06: 8 mg via INTRAVENOUS
  Filled 2022-03-06: qty 4

## 2022-03-06 MED ORDER — SODIUM CHLORIDE 0.9% FLUSH
10.0000 mL | Freq: Once | INTRAVENOUS | Status: AC
  Administered 2022-03-06: 10 mL

## 2022-03-06 MED ORDER — HEPARIN SOD (PORK) LOCK FLUSH 100 UNIT/ML IV SOLN
500.0000 [IU] | Freq: Once | INTRAVENOUS | Status: AC | PRN
  Administered 2022-03-06: 500 [IU]

## 2022-03-06 MED ORDER — SODIUM CHLORIDE 0.9 % IV SOLN: Freq: Once | INTRAVENOUS | Status: AC

## 2022-03-06 MED ORDER — SODIUM CHLORIDE 0.9% FLUSH
10.0000 mL | INTRAVENOUS | Status: DC | PRN
  Administered 2022-03-06: 10 mL

## 2022-03-06 MED ORDER — ALPRAZOLAM 1 MG PO TABS: 1.0000 mg | ORAL_TABLET | Freq: Three times a day (TID) | ORAL | 2 refills | Status: DC | PRN

## 2022-03-06 NOTE — Progress Notes (Signed)
[place] Telephone:([phone]   Fax:([phone]  PROGRESS NOTE  Patient Care Team: Allwardt, Randa Evens, PA-C as PCP - General (Physician Assistant) Milus Banister, MD as Attending Physician (Gastroenterology) Dr. Darylene Price, MD as Consulting Physician (Orthopedic Surgery) Latanya Maudlin, MD as Consulting Physician (Orthopedic Surgery) Kathrynn Ducking, MD (Inactive) as Consulting Physician (Neurology) Okey Regal, Anthon as Consulting Physician (Optometry) Delice Lesch Lezlie Octave, MD as Consulting Physician (Neurology) Edythe Clarity, [facility] (Pharmacist) Kassie Mends, RN as [place] History # Small Cell Lung Cancer, Extensive Stage 07/05/2020: CT abdomen for lower abdominal pain. New pulmonary nodular density noted 07/06/2020: CT chest showed 2.2 cm macrolobulated right lower lobe pulmonary nodule (favored) versus pathologically enlarged infrahilar lymph node 10/13/2020: PET CT scan performed, findings show 2 cm right lower lobe lung mass is hypermetabolic and consistent with primary lung neoplasm. Additionally found hypermetabolic 17 mm soft tissue lesion between the descending duodenum and the pancreatic head  10/19/2020: EGD to biopsy hypermetabolic lymph node. Biopsy results show small cell lung cancer 10/26/2020: establish care with Dr. Lorenso Courier  11/13/2020: Cycle 1 Day 1 of Carbo/Etop/Atezolizumab 12/04/2020: Cycle 2 Day 1 of Carbo/Etop/Atezolizumab 11/22-11/25/2022: admitted for E. Coli bacteremia/sepsis. Start of Cycle 3 delayed. 01/02/2021: Cycle 3 Day 1 of Carbo/Etop/Atezolizumab 01/23/2021: Cycle 4 Day 1 of Carbo/Etop/Atezolizumab 02/22/2021: Cycle 5 Day 1 of Atezolizumab Maintenance. Delayed start due to patient's COVID infection.  03/14/2021: Cycle 6 Day 1 of Atezolizumab Maintenance 04/05/2021: Cycle 7 Day 1 of Atezolizumab Maintenance 04/25/2021: Cycle 8 Day 1 of Atezolizumab  Maintenance 05/16/2021: Cycle 9 Day 1 of Atezolizumab Maintenance 06/06/2021: Cycle 10 Day 1 of Atezolizumab Maintenance 06/27/2021:  Cycle 11 Day 1 of Atezolizumab Maintenance 07/18/2021: Cycle 12 Day 1 of Atezolizumab Maintenance 08/08/2021: Cycle 13 Day 1 of Atezolizumab Maintenance 08/29/2021: Cycle 14 Day 1 of Atezolizumab Maintenance 09/19/2021: Cycle 15 Day 1 of Atezolizumab Maintenance 10/10/2021: Cycle 16 Day 1 of Atezolizumab Maintenance 10/31/2021: Cycle 17 Day 1 of Atezolizumab Maintenance 11/21/2021: Cycle 18 Day 1 of Atezolizumab Maintenance 12/12/2021: Cycle 19 Day 1 of Atezolizumab Maintenance 01/02/2022: Cycle 20 Day 1 of Atezolizumab Maintenance 01/23/2022: Cycle 21 Day 1 of Atezolizumab Maintenance 02/13/2022: treatment HELD due to diarrhea.  03/06/2022: Cycle 22 Day 1 of Atezolizumab Maintenance  [place] History:  [name] 67 y.o. female with medical history significant for extensive stage small cell lung cancer who presents for a follow up visit. The patient's last visit was on 02/13/2022. In the interim since the last visit, her treatment was held due to diarrhea. She presents today to start cycle 22 of chemotherapy.  On exam today [name] reports her diarrhea has improved and only having up to 2 episodes per day.  She recently started taking Lomotil which has improved her symptoms.  Her energy levels are stable and she continues to complete ADLs on her own.  Her appetite and weight are overall stable.  Patient has some nausea that resolves with Zofran as needed.  She denies abdominal pain.  Patient has intermittent episodes of hot flashes but denies any infectious symptoms.  Patient denies fevers, chills, night sweats, shortness of breath, chest pain or cough.  She has no other complaints.A full 10 point ROS is listed below.    MEDICAL HISTORY:  Past Medical History:  Diagnosis Date   Allergic rhinitis    Anemia    Anxiety    Chicken pox    Chronic back pain  COPD (chronic obstructive pulmonary disease) (HCC)    Depression    DM (diabetes mellitus) ([place])    Essential hypertension    GERD (gastroesophageal reflux disease)    Headache    migraines   History of gastritis    EGD 2015   History of home oxygen therapy    2 liters at hs last 6 months   Hyperlipidemia    Hypothyroidism    Migraines    Osteoarthritis    oa   Scoliosis     SURGICAL HISTORY: Past Surgical History:  Procedure Laterality Date   APPENDECTOMY     1985   BIOPSY  07/24/2017   Procedure: BIOPSY;  Surgeon: Milus Banister, MD;  Location: [facility];  Service: Endoscopy;;   CARDIAC CATHETERIZATION N/A 10/31/2015   Procedure: Left Heart Cath and Coronary Angiography;  Surgeon: Leonie Man, MD;  Location: [place] [facility];  Service: Cardiovascular;  Laterality: N/A;   CARPAL TUNNEL RELEASE Left    CARPAL TUNNEL RELEASE Right    CHOLECYSTECTOMY  late 1980's   COLONOSCOPY WITH PROPOFOL N/A 07/24/2017   Procedure: COLONOSCOPY WITH PROPOFOL;  Surgeon: Milus Banister, MD;  [facility];  Service: Endoscopy;  Laterality: N/A;   ESOPHAGOGASTRODUODENOSCOPY N/A 07/24/2017   Procedure: ESOPHAGOGASTRODUODENOSCOPY (EGD);  Surgeon: Milus Banister, MD;  Location: [facility];  Service: Endoscopy;  Laterality: N/A;   ESOPHAGOGASTRODUODENOSCOPY (EGD) WITH PROPOFOL N/A 10/19/2020   Procedure: ESOPHAGOGASTRODUODENOSCOPY (EGD) WITH PROPOFOL;  Surgeon: Milus Banister, MD;  [facility];  Service: Endoscopy;  Laterality: N/A;   FINE NEEDLE ASPIRATION N/A 10/19/2020   Procedure: FINE NEEDLE ASPIRATION (FNA) LINEAR;  Surgeon: Milus Banister, MD;  [facility];  Service: Endoscopy;  Laterality: N/A;   GALLBLADDER SURGERY  1991   HIP CLOSED REDUCTION Right 01/08/2016   Procedure: CLOSED MANIPULATION HIP;  Surgeon: Susa Day, MD;  [facility];  Service: Orthopedics;  Laterality: Right;   HIP CLOSED REDUCTION Right 01/19/2016   Procedure:  ATTEMPTED CLOSED REDUCTION RIGHT HIP;  Surgeon: Wylene Simmer, MD;  [facility];  Service: Orthopedics;  Laterality: Right;   HIP CLOSED REDUCTION Right 01/20/2016   Procedure: CLOSED REDUCTION RIGHT TOTAL HIP;  Surgeon: Paralee Cancel, MD;  [facility];  Service: Orthopedics;  Laterality: Right;   HIP CLOSED REDUCTION Right 02/17/2016   Procedure: CLOSED REDUCTION RIGHT TOTAL HIP;  Surgeon: Rod Can, MD;  Location: [place];  Service: Orthopedics;  Laterality: Right;   HIP CLOSED REDUCTION Right 02/28/2016   Procedure: CLOSED REDUCTION HIP;  Surgeon: Nicholes Stairs, MD;  [facility];  Service: Orthopedics;  Laterality: Right;   IR IMAGING GUIDED PORT INSERTION  11/01/2020   POLYPECTOMY  07/24/2017   Procedure: POLYPECTOMY;  Surgeon: Milus Banister, MD;  [facility];  Service: Endoscopy;;   [place], with 1 ovary removed and 2 nd ovary removed 2003   TOTAL HIP ARTHROPLASTY Right    Original surgery 2006 with revision 2010   TOTAL HIP REVISION Right 01/01/2016   Procedure: TOTAL HIP REVISION;  Surgeon: Paralee Cancel, MD;  [facility];  Service: Orthopedics;  Laterality: Right;   TOTAL HIP REVISION Right 03/02/2016   Procedure: TOTAL HIP REVISION;  Surgeon: Paralee Cancel, MD;  [facility];  Service: Orthopedics;  Laterality: Right;   TOTAL HIP REVISION Right 09/02/2016   Procedure: Right hip constrained liner- posterior;  Surgeon: Paralee Cancel, MD;  [facility];  Service: Orthopedics;  Laterality: Right;   ULNAR NERVE TRANSPOSITION Right    UPPER ESOPHAGEAL ENDOSCOPIC ULTRASOUND (EUS) N/A 10/19/2020   Procedure: UPPER ESOPHAGEAL ENDOSCOPIC ULTRASOUND (EUS);  Surgeon: Milus Banister, MD;  Location: [facility];  Service: Endoscopy;  Laterality: N/A;  periduodenal lesion    SOCIAL HISTORY: Social History   Socioeconomic History   Marital status: Married    Spouse name: Not on file   Number of children:  2   Years of education: Not on file   Highest education level: Not on file  Occupational History   Occupation: disabled   Occupation: disabled  Tobacco Use   Smoking status: Every Day    Packs/day: 2.00    Years: 46.00    Total pack years: 92.00    Types: Cigarettes   Smokeless tobacco: Never   Tobacco comments:    2 packs of cigarettes smoked daily 12/13/21- declines smoking cessation  Vaping Use   Vaping Use: Never used  Substance and Sexual Activity   Alcohol use: No   Drug use: No   Sexual activity: Not Currently    Partners: Male  Other Topics Concern   Not on file  Social History Narrative   Right handed    Caffeine~ 2 cups per day    Lives at home with husband (strained relationship)   Primary caretaker for disabled brother who had aneurism   Daughter died 2018/07/07    Social Determinants of Health   Financial Resource Strain: Low Risk  (12/13/2021)   Overall Financial Resource Strain (CARDIA)    Difficulty of Paying Living Expenses: Not very hard  Food Insecurity: No Food Insecurity (12/13/2021)   Hunger Vital Sign    Worried About Running Out of Food in the Last Year: Never true    [place] in the Last Year: Never true  Transportation Needs: No Transportation Needs (12/13/2021)   PRAPARE - Hydrologist (Medical): No    Lack of Transportation (Non-Medical): No  Physical Activity: Inactive (12/13/2021)   Exercise Vital Sign    Days of Exercise per Week: 0 days    Minutes of Exercise per Session: 0 min  Stress: Stress Concern Present (12/13/2021)   [place]    Feeling of Stress : To some extent  Social Connections: Moderately Integrated (12/13/2021)   Social Connection and Isolation Panel [NHANES]    Frequency of Communication with Friends and Family: Twice a week    Frequency of Social Gatherings with Friends and Family: Once a week    Attends Religious  Services: 1 to 4 times per year    Active Member of [employer] or Organizations: No    Attends Archivist Meetings: Never    Marital Status: Married  Human resources officer Violence: Not At Risk (05/03/2021)   Humiliation, Afraid, Rape, and Kick questionnaire    Fear of Current or Ex-Partner: No    Emotionally Abused: No    Physically Abused: No    Sexually Abused: No    FAMILY HISTORY: Family History  Problem Relation Age of Onset   COPD Mother    Heart disease Mother    Lung disease Father        Asbestosis   Heart attack Father    Heart disease Father    Cerebral aneurysm Brother    Aneurysm Brother        Brain   Drug  abuse Daughter    Epilepsy Son    Alcohol abuse Son    Drug abuse Son    Arthritis Maternal Grandmother    Heart disease Maternal Grandmother    Asthma Maternal Grandfather    Cancer Maternal Grandfather    Arthritis Paternal Grandmother    Heart disease Paternal Grandmother    Stroke Paternal Grandmother    Early death Paternal Grandfather    Heart disease Paternal Grandfather     ALLERGIES:  is allergic to metformin and related, nsaids, wellbutrin [bupropion], aleve [naproxen sodium], codeine, penicillins, and sulfonamide derivatives.  MEDICATIONS:  Current Outpatient Medications  Medication Sig Dispense Refill   albuterol (PROAIR HFA) 108 (90 Base) MCG/ACT inhaler 2 puffs every 4 hours as needed only  if your can't catch your breath 18 g 3   ALPRAZolam (XANAX) 1 MG tablet TAKE 1 TABLET BY MOUTH THREE TIMES DAILY AS NEEDED FOR ANXIETY 90 tablet 2   atorvastatin (LIPITOR) 20 MG tablet TAKE 1 TABLET BY MOUTH AT BEDTIME 90 tablet 1   benzonatate (TESSALON) 100 MG capsule Take 1 capsule (100 mg total) by mouth 3 (three) times daily. 90 capsule 1   Budeson-Glycopyrrol-Formoterol (BREZTRI AEROSPHERE) 160-9-4.8 MCG/ACT AERO Inhale 2 puffs into the lungs in the morning and at bedtime. 10.7 g 3   cyclobenzaprine (FLEXERIL) 10 MG tablet Take 10 mg by mouth 3  (three) times daily as needed.     diphenoxylate-atropine (LOMOTIL) 2.5-0.025 MG tablet TAKE 1 TABLET BY MOUTH FOUR TIMES DAILY AS NEEDED FOR DIARRHEA OR loose stools 60 tablet 0   DULoxetine (CYMBALTA) 60 MG capsule Take 2 capsules (120 mg total) by mouth daily. 180 capsule 1   fluticasone (FLONASE) 50 MCG/ACT nasal spray INSTILL 2 SPRAYS IN EACH NOSTRIL EVERY DAY 16 g 6   furosemide (LASIX) 20 MG tablet TAKE 1 TABLET BY MOUTH TWICE DAILY, IF SWELLING IMPROVES CAN HOLD MEDICATION 90 tablet 0   levothyroxine (SYNTHROID) 137 MCG tablet Take 1 tablet (137 mcg total) by mouth daily before breakfast. 90 tablet 1   loratadine (CLARITIN) 10 MG tablet Take 10 mg by mouth daily.     meloxicam (MOBIC) 15 MG tablet TAKE 1 TABLET BY MOUTH EVERY DAY 30 tablet 2   OLANZapine (ZYPREXA) 10 MG tablet Take 1 tablet (10 mg total) by mouth at bedtime. 30 tablet 2   omeprazole (PRILOSEC) 40 MG capsule TAKE ONE CAPSULE BY MOUTH TWICE DAILY 60 capsule 0   ondansetron (ZOFRAN) 8 MG tablet TAKE 1 TABLET BY MOUTH TWICE DAILY 60 tablet 1   oxyCODONE 10 MG TABS Take 1 tablet (10 mg total) by mouth every 4 (four) hours as needed for severe pain. Goal - to replace Norco- max 5 tabs/day 150 tablet 0   potassium chloride SA (KLOR-CON M) 20 MEQ tablet Take 1 tablet (20 mEq total) by mouth 2 (two) times daily. 60 tablet 2   prochlorperazine (COMPAZINE) 10 MG tablet Take 1 tablet (10 mg total) by mouth every 6 (six) hours as needed for nausea or vomiting. 30 tablet 0   triamterene-hydrochlorothiazide (MAXZIDE-25) 37.5-25 MG tablet TAKE 1 TABLET BY MOUTH DAILY 90 tablet 2   azithromycin (ZITHROMAX Z-PAK) 250 MG tablet Take 2 tablets on day 1 and then 1 daily for next 4 days (Patient not taking: Reported on 03/06/2022) 6 each 0   HYDROcodone-acetaminophen (NORCO) 10-325 MG tablet TAKE 1 TABLET BY MOUTH EVERY 4 HOURS AS NEEDED. MAX OF FIVE TABLETS DAILY (Patient not taking: Reported on 03/06/2022) 150  tablet 0   No current  facility-administered medications for this visit.    REVIEW OF SYSTEMS:   Constitutional: ( - ) fevers, ( - )  chills , ( - ) night sweats Eyes: ( - ) blurriness of vision, ( - ) double vision, ( - ) watery eyes Ears, nose, mouth, throat, and face: ( - ) mucositis, ( - ) sore throat Respiratory: ( - ) cough, ( -) dyspnea, ( - ) wheezes Cardiovascular: ( - ) palpitation, ( - ) chest discomfort, ( - ) lower extremity swelling Gastrointestinal:  ( +) nausea, ( - ) heartburn, ( - ) change in bowel habits Skin: ( - ) abnormal skin rashes Lymphatics: ( - ) new lymphadenopathy, ( - ) easy bruising Neurological: (+ ) numbness, ( - ) tingling, ( - ) new weaknesses Behavioral/Psych: ( - ) mood change, ( - ) new changes  All other systems were reviewed with the patient and are negative.  PHYSICAL EXAMINATION: ECOG PERFORMANCE STATUS: 1 - Symptomatic but completely ambulatory  Vitals:   03/06/22 0817  BP: 136/72  Pulse: 89  Resp: 18  Temp: 97.7 F (36.5 C)  SpO2: 97%    Filed Weights   03/06/22 0817  Weight: 195 lb 12.8 oz (88.8 kg)     GENERAL: Well-appearing middle-age Caucasian female, alert, no distress and comfortable SKIN: skin color, texture, turgor are normal, no rashes or significant lesions EYES: conjunctiva are pink and non-injected, sclera clear LUNGS:  normal breathing effort. Diffuse wheezing hear on ausculation.  HEART: regular rate & rhythm and no murmurs. Mild bilateral lower extremity edema Musculoskeletal: no cyanosis of digits and no clubbing  PSYCH: alert & oriented x 3, fluent speech NEURO: no focal motor/sensory deficits  LABORATORY DATA:  I have reviewed the data as listed    Latest Ref Rng & Units 03/06/2022    7:54 AM 02/13/2022    7:54 AM 01/23/2022    9:51 AM  CBC  WBC 4.0 - 10.5 K/uL 9.3  7.7  9.3   Hemoglobin 12.0 - 15.0 g/dL 12.5  12.5  12.3   Hematocrit 36.0 - 46.0 % 39.1  38.9  38.5   Platelets 150 - 400 K/uL 184  164  172        Latest Ref  Rng & Units 03/06/2022    7:54 AM 02/13/2022    7:54 AM 01/23/2022    9:51 AM  CMP  Glucose 70 - 99 mg/dL 88  89  109   BUN 8 - 23 mg/dL 11  8  6    Creatinine 0.44 - 1.00 mg/dL 0.93  0.91  0.86   Sodium 135 - 145 mmol/L 134  136  136   Potassium 3.5 - 5.1 mmol/L 3.8  3.2  4.2   Chloride 98 - 111 mmol/L 98  99  101   CO2 22 - 32 mmol/L 29  31  32   Calcium 8.9 - 10.3 mg/dL 8.5  8.5  8.6   Total Protein 6.5 - 8.1 g/dL 6.1  5.7  6.3   Total Bilirubin 0.3 - 1.2 mg/dL 0.3  0.3  0.3   Alkaline Phos 38 - 126 U/L 69  75  80   AST 15 - 41 U/L 11  11  13    ALT 0 - 44 U/L 7  8  9      No results found for: "MPROTEIN" Lab Results  Component Value Date   KPAFRELGTCHN 0.75 06/30/2014   LAMBDASER 3.78 (  H) 06/30/2014   KAPLAMBRATIO 0.20 (L) 06/30/2014     RADIOGRAPHIC STUDIES: No results found.  ASSESSMENT & PLAN JOLIE STROHECKER 66 y.o. female with medical history significant for extensive stage small cell lung cancer who presents for a follow up visit.   After review of the labs, review of the records, and discussion with the patient the patients findings are most consistent with extensive stage small cell lung cancer with metastasis from the right lower lobe to the lymph nodes of the abdomen.  At this time we will pursue triple therapy with carboplatin, etoposide, and atezolizumab.  After 4 cycles we will convert to maintenance atezolizumab alone.  We previously discussed the risks and benefits of this therapy and the patient was in agreement to proceed with this treatment.  The treatment of choice consist of carboplatin, etoposide, and atezolizumab.  The regimen consists of carboplatin AUC of 5 IV on day 1, etoposide 100 mg per metered squared IV on day 1, 2, and 3 and atezolizumab 1200 mg on day 1.  This continues for 21-day cycles.  After 4 cycles the patient proceeds with atezolizumab maintenance therapy alone.    # Small Cell Lung Cancer, Extensive Stage -- MRI of the brain shows no  evidence of intracranial spread --Findings are currently consistent with metastatic small cell lung cancer with metastatic spread to the lymph nodes of the abdomen --Prognosis is poor with anticipated life span of less than 12 months -- Patient has extensive stage due to metastatic spread to lymph nodes in the abdomen --Plan to proceed with carboplatin, etoposide, and atezolizumab --will consider prophylactic cranial radiation after start of chemotherapy.  Plan: --today is Cycle 22 Day 1 of Atezolizumab maintenance  --labs today were reviewed and adequate for treatment. Hgb 12.5, white blood cell 9.3, MCV 87.5, and platelets of 184. --Okay to proceed with treatment today since diarrhea has improved to grade 1. --CT scan q 3 months. Last scan in Dec 2023 showed continued excellent response to therapy. CT scan next in March 2024.  --RTC in 3 weeks for next cycle of maintenance atezolizumab    #Diarrhea --Episodes have improved and now only having up to 2 episodes per day. --Patient reports improvement of symptoms with Lomotil.  Recommend to continue as needed. --stool sent for C. Diff and GI pathology panel, all negative  #Nausea: --Symptoms improved with zofran and olanzapine.  --Added IV zofran to infusion premeds for better symptom control.   #Hypokalemia:  --currently taking potassium chloride 20 meq BID --potassium level is still 3.8 today. Continue PO supplementation.   #Lower extremity edema-worsening --Currently takes lasix 20 mg daily, recommending increasing to q 6 hours x 2 doses daily.  --Improving. Continue to monitor.   #Neuropathy involving fingers/feet: --Patient currently takes Cymbalta   #Supportive Care -- chemotherapy education complete -- port placed -- zofran 8mg  q8H PRN and compazine 10mg  PO q6H for nausea -- EMLA cream for port -- Will add mirtazapine in order to help with appetite/sleep  No orders of the defined types were placed in this  encounter.  All questions were answered. The patient knows to call the clinic with any problems, questions or concerns.  I have spent a total of 30 minutes minutes of face-to-face and non-face-to-face time, preparing to see the patient, performing a medically appropriate examination, counseling and educating the patient, ordering medications/tests, documenting clinical information in the electronic health record, and care coordination.   Dede Query PA-C Dept of Hematology and [place]  [place] at [facility] Phone: [phone]   03/06/2022 8:36 AM

## 2022-03-06 NOTE — Progress Notes (Unsigned)
Subjective:    Patient ID: [name], female    DOB: December 15, 1955, 67 y.o.   MRN: [id number]  Chief Complaint  Patient presents with   Rash    Between shoulder blades and on scalp     HPI Patient is in today for recheck on diabetes, anxiety, depression.  Also has new skin lesions ? Rash between shoulder blades and a few in scalp. Keeps scratching the scabs off. Not much pain, but keeps getting new sores popping up. No fever or chills. Currently undergoing infusions for small cell lung cancer.   Past Medical History:  Diagnosis Date   Allergic rhinitis    Anemia    Anxiety    Chicken pox    Chronic back pain    COPD (chronic obstructive pulmonary disease) (HCC)    Depression    DM (diabetes mellitus) ([place])    Essential hypertension    GERD (gastroesophageal reflux disease)    Headache    migraines   History of gastritis    EGD 2015   History of home oxygen therapy    2 liters at hs last 6 months   Hyperlipidemia    Hypothyroidism    Migraines    Osteoarthritis    oa   Scoliosis     Past Surgical History:  Procedure Laterality Date   APPENDECTOMY     1985   BIOPSY  07/24/2017   Procedure: BIOPSY;  Surgeon: Milus Banister, MD;  Location: [facility];  Service: Endoscopy;;   CARDIAC CATHETERIZATION N/A 10/31/2015   Procedure: Left Heart Cath and Coronary Angiography;  Surgeon: Leonie Man, MD;  Location: [facility];  Service: Cardiovascular;  Laterality: N/A;   CARPAL TUNNEL RELEASE Left    CARPAL TUNNEL RELEASE Right    CHOLECYSTECTOMY  late 1980's   COLONOSCOPY WITH PROPOFOL N/A 07/24/2017   Procedure: COLONOSCOPY WITH PROPOFOL;  Surgeon: Milus Banister, MD;  [facility];  Service: Endoscopy;  Laterality: N/A;   ESOPHAGOGASTRODUODENOSCOPY N/A 07/24/2017   Procedure: ESOPHAGOGASTRODUODENOSCOPY (EGD);  Surgeon: Milus Banister, MD;  Location: [facility];  Service: Endoscopy;  Laterality: N/A;   ESOPHAGOGASTRODUODENOSCOPY (EGD) WITH  PROPOFOL N/A 10/19/2020   Procedure: ESOPHAGOGASTRODUODENOSCOPY (EGD) WITH PROPOFOL;  Surgeon: Milus Banister, MD;  [facility];  Service: Endoscopy;  Laterality: N/A;   FINE NEEDLE ASPIRATION N/A 10/19/2020   Procedure: FINE NEEDLE ASPIRATION (FNA) LINEAR;  Surgeon: Milus Banister, MD;  [facility];  Service: Endoscopy;  Laterality: N/A;   GALLBLADDER SURGERY  1991   HIP CLOSED REDUCTION Right 01/08/2016   Procedure: CLOSED MANIPULATION HIP;  Surgeon: Susa Day, MD;  [facility]: [facility];  Service: Orthopedics;  Laterality: Right;   HIP CLOSED REDUCTION Right 01/19/2016   Procedure: ATTEMPTED CLOSED REDUCTION RIGHT HIP;  Surgeon: Wylene Simmer, MD;  [facility]: [facility];  Service: Orthopedics;  Laterality: Right;   HIP CLOSED REDUCTION Right 01/20/2016   Procedure: CLOSED REDUCTION RIGHT TOTAL HIP;  Surgeon: Paralee Cancel, MD;  [facility];  Service: Orthopedics;  Laterality: Right;   HIP CLOSED REDUCTION Right 02/17/2016   Procedure: CLOSED REDUCTION RIGHT TOTAL HIP;  Surgeon: Rod Can, MD;  Location: [facility];  Service: Orthopedics;  Laterality: Right;   HIP CLOSED REDUCTION Right 02/28/2016   Procedure: CLOSED REDUCTION HIP;  Surgeon: Nicholes Stairs, MD;  [facility];  Service: Orthopedics;  Laterality: Right;   IR IMAGING GUIDED PORT INSERTION  11/01/2020   POLYPECTOMY  07/24/2017  Procedure: POLYPECTOMY;  Surgeon: Milus Banister, MD;  Location: [facility];  Service: Endoscopy;;   [place], with 1 ovary removed and 2 nd ovary removed 2003   [place] Right    Original surgery 2006 with revision 2010   TOTAL HIP REVISION Right 01/01/2016   Procedure: TOTAL HIP REVISION;  Surgeon: Paralee Cancel, MD;  [facility]: [facility];  Service: Orthopedics;  Laterality: Right;   TOTAL HIP REVISION Right 03/02/2016   Procedure: TOTAL HIP REVISION;  Surgeon: Paralee Cancel, MD;  [facility];  Service:  Orthopedics;  Laterality: Right;   TOTAL HIP REVISION Right 09/02/2016   Procedure: Right hip constrained liner- posterior;  Surgeon: Paralee Cancel, MD;  Location: [facility];  Service: Orthopedics;  Laterality: Right;   ULNAR NERVE TRANSPOSITION Right    UPPER ESOPHAGEAL ENDOSCOPIC ULTRASOUND (EUS) N/A 10/19/2020   Procedure: UPPER ESOPHAGEAL ENDOSCOPIC ULTRASOUND (EUS);  Surgeon: Milus Banister, MD;  Location: [facility];  Service: Endoscopy;  Laterality: N/A;  periduodenal lesion    Family History  Problem Relation Age of Onset   COPD Mother    Heart disease Mother    Lung disease Father        Asbestosis   Heart attack Father    Heart disease Father    Cerebral aneurysm Brother    Aneurysm Brother        Brain   Drug abuse Daughter    Epilepsy Son    Alcohol abuse Son    Drug abuse Son    Arthritis Maternal Grandmother    Heart disease Maternal Grandmother    Asthma Maternal Grandfather    Cancer Maternal Grandfather    Arthritis Paternal Grandmother    Heart disease Paternal Grandmother    Stroke Paternal Grandmother    Early death Paternal Grandfather    Heart disease Paternal Grandfather     Social History   Tobacco Use   Smoking status: Every Day    Packs/day: 2.00    Years: 46.00    Total pack years: 92.00    Types: Cigarettes   Smokeless tobacco: Never   Tobacco comments:    2 packs of cigarettes smoked daily 12/13/21- declines smoking cessation  Vaping Use   Vaping Use: Never used  Substance Use Topics   Alcohol use: No   Drug use: No     Allergies  Allergen Reactions   Metformin And Related Diarrhea   Nsaids Diarrhea   Wellbutrin [Bupropion] Other (See Comments)    Makes her too sleepy    Aleve [Naproxen Sodium] Other (See Comments)    Headache    Codeine Nausea Only and Other (See Comments)    GI upset   Penicillins Nausea Only and Other (See Comments)    GI upset    Sulfonamide Derivatives Hives    Review of Systems NEGATIVE UNLESS  OTHERWISE INDICATED IN HPI      Objective:     BP 120/78   Pulse 92   Temp 98 F (36.7 C) (Temporal)   Resp 16   Ht 5' 2.5" (1.588 m)   Wt 195 lb (88.5 kg)   SpO2 97%   BMI 35.10 kg/m   Wt Readings from Last 3 Encounters:  03/07/22 195 lb (88.5 kg)  03/06/22 195 lb (88.5 kg)  03/06/22 195 lb 12.8 oz (88.8 kg)    BP Readings from Last 3 Encounters:  03/07/22 126/74  03/06/22 120/78  03/06/22 136/72     Physical Exam Vitals and nursing note reviewed.  Constitutional:      Appearance: Normal appearance. She is obese.  Cardiovascular:     Rate and Rhythm: Normal rate and regular rhythm.  Pulmonary:     Effort: Pulmonary effort is normal.     Breath sounds: Normal breath sounds.  Skin:    Findings: Lesion (upper back few scattered oval slightly erythematous lesions; no blisters present, no abscesses) present.  Neurological:     General: No focal deficit present.     Mental Status: She is alert and oriented to [name], place, and time.  Psychiatric:        Mood and Affect: Mood normal.        Behavior: Behavior normal.        Assessment & Plan:  Cellulitis of back  Anxiety and depression Assessment & Plan: Cymbalta 120 mg daily. Xanax 1 mg TID daily. Stable, doing well at this time.    Macular degeneration of left eye, unspecified type Assessment & Plan: Regular follow-up with ophthalmology [facility] in [place], [place].    Type 2 diabetes mellitus with diabetic neuropathy, without long-term current use of insulin (HCC) Assessment & Plan: Lab Results  Component Value Date   HGBA1C 5.9 (A) 03/06/2022   Diet-controlled Doing a great job, encouraged to keep up good work   Orders: -     POCT glycosylated hemoglobin (Hb A1C)  Other orders -     ALPRAZolam; Take 1 tablet (1 mg total) by mouth 3 (three) times daily as needed. for anxiety  Dispense: 90 tablet; Refill: 2 -     Doxycycline Hyclate; Take 1 tablet (100 mg total) by mouth 2 (two)  times daily for 7 days.  Dispense: 14 tablet; Refill: 0    Cellulitis of back - doxycycline 100 mg BID as directed; wash with Dial bar soap, monitor, recheck prn.     Return in about 6 months (around 09/04/2022) for med recheck .    Shaquon Gropp M Hays Dunnigan, PA-C

## 2022-03-06 NOTE — Patient Instructions (Signed)
[facility]  Discharge Instructions: Thank you for choosing [facility] to provide your oncology and hematology care.   If you have a lab appointment with the [facility], please go directly to the [facility] and check in at the registration area.   Wear comfortable clothing and clothing appropriate for easy access to any Portacath or PICC line.   We strive to give you quality time with your provider. You may need to reschedule your appointment if you arrive late (15 or more minutes).  Arriving late affects you and other patients whose appointments are after yours.  Also, if you miss three or more appointments without notifying the office, you may be dismissed from the clinic at the provider's discretion.      For prescription refill requests, have your pharmacy contact our office and allow 72 hours for refills to be completed.    Today you received the following chemotherapy and/or immunotherapy agents tecentriq      To help prevent nausea and vomiting after your treatment, we encourage you to take your nausea medication as directed.  BELOW ARE SYMPTOMS THAT SHOULD BE REPORTED IMMEDIATELY: *FEVER GREATER THAN 100.4 F (38 C) OR HIGHER *CHILLS OR SWEATING *NAUSEA AND VOMITING THAT IS NOT CONTROLLED WITH YOUR NAUSEA MEDICATION *UNUSUAL SHORTNESS OF BREATH *UNUSUAL BRUISING OR BLEEDING *URINARY PROBLEMS (pain or burning when urinating, or frequent urination) *BOWEL PROBLEMS (unusual diarrhea, constipation, pain near the anus) TENDERNESS IN MOUTH AND THROAT WITH OR WITHOUT PRESENCE OF ULCERS (sore throat, sores in mouth, or a toothache) UNUSUAL RASH, SWELLING OR PAIN  UNUSUAL VAGINAL DISCHARGE OR ITCHING   Items with * indicate a potential emergency and should be followed up as soon as possible or go to the Emergency Department if any problems should occur.  Please show the CHEMOTHERAPY ALERT CARD or IMMUNOTHERAPY ALERT CARD at  check-in to the Emergency Department and triage nurse.  Should you have questions after your visit or need to cancel or reschedule your appointment, please contact [facility]  Dept: [phone]  and follow the prompts.  Office hours are 8:00 a.m. to 4:30 p.m. Monday - Friday. Please note that voicemails left after 4:00 p.m. may not be returned until the following business day.  We are closed weekends and major holidays. You have access to a nurse at all times for urgent questions. Please call the main number to the clinic Dept: [phone] and follow the prompts.   For any non-urgent questions, you may also contact your provider using MyChart. We now offer e-Visits for anyone 70 and older to request care online for non-urgent symptoms. For details visit [email or web address].   Also download the MyChart app! Go to the app store, search "MyChart", open the app, select [facility], and log in with your MyChart username and password.

## 2022-03-06 NOTE — Progress Notes (Signed)
Nutrition Follow-up:  Patient with extensive stage small cell lung cancer. She is receiving maintenance therapy with atezolizumab q21d.   1/17 - treatment held  Met with patient in infusion. Patient says she is doing well today. She reports diarrhea has gotten "much better." Patient continues to have ~2 episodes daily. She is taking lomotil. Patient endorses improved appetite and eating more. She is waking up hungry. Typically eats 2 scrambled eggs or ham egg and cheese sandwich. Occasionally pt will have a snack ~3 PM (graham crackers, pretzels, slice of cake). Recalls lasagna with piece of french bread for supper.    Medications: lomotil, lasix, mobic, zofran, oxycodone  Labs: Na 134, Ca 8.5  Anthropometrics: Wt 195 lb 12.8 oz today   1/5 - 197 lb  12/27 - 194 lb 7 oz 12/6 - 195 lb 1.6   NUTRITION DIAGNOSIS: Inadequate oral intake improving     INTERVENTION:  Continue strategies for increasing oral intake, encouraged high protein snacks in between meals Discussed strategies for diarrhea, provided samples of Banatrol for pt to try Continue lomotil per MD    MONITORING, EVALUATION, GOAL: weight trends, intake   NEXT VISIT: To be scheduled as needed with treatment

## 2022-03-07 ENCOUNTER — Other Ambulatory Visit: Payer: 59

## 2022-03-07 ENCOUNTER — Other Ambulatory Visit: Payer: Self-pay

## 2022-03-07 ENCOUNTER — Ambulatory Visit (INDEPENDENT_AMBULATORY_CARE_PROVIDER_SITE_OTHER): Payer: 59 | Admitting: Nurse Practitioner

## 2022-03-07 ENCOUNTER — Encounter: Payer: Self-pay | Admitting: Hematology and Oncology

## 2022-03-07 ENCOUNTER — Encounter: Payer: Self-pay | Admitting: Nurse Practitioner

## 2022-03-07 VITALS — BP 126/74 | HR 90 | Ht 62.5 in | Wt 195.0 lb

## 2022-03-07 DIAGNOSIS — K2100 Gastro-esophageal reflux disease with esophagitis, without bleeding: Secondary | ICD-10-CM | POA: Diagnosis not present

## 2022-03-07 DIAGNOSIS — R1030 Lower abdominal pain, unspecified: Secondary | ICD-10-CM | POA: Diagnosis not present

## 2022-03-07 LAB — T4: T4, Total: 8 ug/dL (ref 4.5–12.0)

## 2022-03-07 MED ORDER — OMEPRAZOLE 40 MG PO CPDR: DELAYED_RELEASE_CAPSULE | ORAL | 3 refills | Status: DC

## 2022-03-07 MED ORDER — DICYCLOMINE HCL 10 MG PO CAPS: 10.0000 mg | ORAL_CAPSULE | Freq: Two times a day (BID) | ORAL | 1 refills | Status: DC

## 2022-03-07 NOTE — Patient Instructions (Signed)
We have sent the following medications to your pharmacy for you to pick up at your convenience: Dicyclomine and Omeprazole  Your provider has requested that you go to the basement level for lab work before leaving today. Press "B" on the elevator. The lab is located at the first door on the left as you exit the elevator.  Due to recent changes in healthcare laws, you may see the results of your imaging and laboratory studies on MyChart before your provider has had a chance to review them.  We understand that in some cases there may be results that are confusing or concerning to you. Not all laboratory results come back in the same time frame and the provider may be waiting for multiple results in order to interpret others.  Please give [place] 48 hours in order for your provider to thoroughly review all the results before contacting the office for clarification of your results.    Thank you for trusting me with your gastrointestinal care!   Carl Best, CRNP

## 2022-03-07 NOTE — Progress Notes (Signed)
03/07/2022 [name] [id number] 1955/04/13   Chief Complaint: Diarrhea   History of Present Illness: Cletis Muma is a 67 year old female with a past medical history of anxiety, depression, hypertension, hyperlipidemia, DM II, COPD (no longer uses home oxygen), metastatic small cell lung cancer with metastatic disease treated with chemo and immunotherapy, E. Coli sepsis 11/2020, back pain, hypothyroidism, IDA, GERD and colon polys. She is known by Dr. Ardis Hughs. She presents today for further evaluation regarding diarrhea and abdominal cramping x 6 weeks. She last received immunotherapy (Atezolizumab) on 01/23/2022 which has been held since then due to having diarrhea. GI pathogen panel and C. Diff tests 02/13/2022 were negative. TSH 1.099. Normal CBC. Her stools are getting thicker over the past few days. She is taking Lomotil bid. She occasionally sees a small amount of bright red blood on the toilet tissue which she attributes to having hemorrhoids. She underwent a colonoscopy in 2019 which identified one tubular adenomatous polyp. GERD symptoms are controlled on Omeprazole 40mg  QD. She continues to smoke cigarettes 2ppd.       Latest Ref Rng & Units 03/06/2022    7:54 AM 02/13/2022    7:54 AM 01/23/2022    9:51 AM  CBC  WBC 4.0 - 10.5 K/uL 9.3  7.7  9.3   Hemoglobin 12.0 - 15.0 g/dL 12.5  12.5  12.3   Hematocrit 36.0 - 46.0 % 39.1  38.9  38.5   Platelets 150 - 400 K/uL 184  164  172        Latest Ref Rng & Units 03/06/2022    7:54 AM 02/13/2022    7:54 AM 01/23/2022    9:51 AM  CMP  Glucose 70 - 99 mg/dL 88  89  109   BUN 8 - 23 mg/dL 11  8  6    Creatinine 0.44 - 1.00 mg/dL 0.93  0.91  0.86   Sodium 135 - 145 mmol/L 134  136  136   Potassium 3.5 - 5.1 mmol/L 3.8  3.2  4.2   Chloride 98 - 111 mmol/L 98  99  101   CO2 22 - 32 mmol/L 29  31  32   Calcium 8.9 - 10.3 mg/dL 8.5  8.5  8.6   Total Protein 6.5 - 8.1 g/dL 6.1  5.7  6.3   Total Bilirubin 0.3 - 1.2 mg/dL 0.3  0.3  0.3    Alkaline Phos 38 - 126 U/L 69  75  80   AST 15 - 41 U/L 11  11  13    ALT 0 - 44 U/L 7  8  9      GI PROCEDURES:  EUS by Dr. Ardis Hughs (PET scan showed periduodenal mass and a lung mass 10/19/2020: 1.7 cm mas involving the outer wall of the second duodenum. Malignant cells consistent with small cell undifferentiated carcinoma   EGD 07/24/2017 showed evidence of previous peptic ulcer disease in the prepyloric region, mild gastritis was negative for H. Pylori.  A colonoscopy was done on the same date which showed a 66mm tubular adenomatous polyp removed from the ascending colon and diverticulosis to the left colon. Recall colonoscopy 5 years.     Current Outpatient Medications on File Prior to Visit  Medication Sig Dispense Refill   albuterol (PROAIR HFA) 108 (90 Base) MCG/ACT inhaler 2 puffs every 4 hours as needed only  if your can't catch your breath 18 g 3   ALPRAZolam (XANAX) 1 MG tablet Take 1  tablet (1 mg total) by mouth 3 (three) times daily as needed. for anxiety 90 tablet 2   atorvastatin (LIPITOR) 20 MG tablet TAKE 1 TABLET BY MOUTH AT BEDTIME 90 tablet 1   benzonatate (TESSALON) 100 MG capsule Take 1 capsule (100 mg total) by mouth 3 (three) times daily. 90 capsule 1   Budeson-Glycopyrrol-Formoterol (BREZTRI AEROSPHERE) 160-9-4.8 MCG/ACT AERO Inhale 2 puffs into the lungs in the morning and at bedtime. 10.7 g 3   cyclobenzaprine (FLEXERIL) 10 MG tablet Take 10 mg by mouth 3 (three) times daily as needed.     diphenoxylate-atropine (LOMOTIL) 2.5-0.025 MG tablet TAKE 1 TABLET BY MOUTH FOUR TIMES DAILY AS NEEDED FOR DIARRHEA OR loose stools 60 tablet 0   doxycycline (VIBRA-TABS) 100 MG tablet Take 1 tablet (100 mg total) by mouth 2 (two) times daily for 7 days. 14 tablet 0   DULoxetine (CYMBALTA) 60 MG capsule Take 2 capsules (120 mg total) by mouth daily. 180 capsule 1   fluticasone (FLONASE) 50 MCG/ACT nasal spray INSTILL 2 SPRAYS IN EACH NOSTRIL EVERY DAY 16 g 6   furosemide (LASIX) 20  MG tablet TAKE 1 TABLET BY MOUTH TWICE DAILY, IF SWELLING IMPROVES CAN HOLD MEDICATION 90 tablet 0   levothyroxine (SYNTHROID) 137 MCG tablet Take 1 tablet (137 mcg total) by mouth daily before breakfast. 90 tablet 1   loratadine (CLARITIN) 10 MG tablet Take 10 mg by mouth daily.     meloxicam (MOBIC) 15 MG tablet TAKE 1 TABLET BY MOUTH EVERY DAY 30 tablet 2   OLANZapine (ZYPREXA) 10 MG tablet Take 1 tablet (10 mg total) by mouth at bedtime. 30 tablet 2   ondansetron (ZOFRAN) 8 MG tablet TAKE 1 TABLET BY MOUTH TWICE DAILY 60 tablet 1   oxyCODONE 10 MG TABS Take 1 tablet (10 mg total) by mouth every 4 (four) hours as needed for severe pain. Goal - to replace Norco- max 5 tabs/day 150 tablet 0   potassium chloride SA (KLOR-CON M) 20 MEQ tablet Take 1 tablet (20 mEq total) by mouth 2 (two) times daily. 60 tablet 2   prochlorperazine (COMPAZINE) 10 MG tablet Take 1 tablet (10 mg total) by mouth every 6 (six) hours as needed for nausea or vomiting. 30 tablet 0   triamterene-hydrochlorothiazide (MAXZIDE-25) 37.5-25 MG tablet TAKE 1 TABLET BY MOUTH DAILY 90 tablet 2   No current facility-administered medications on file prior to visit.   Allergies  Allergen Reactions   Metformin And Related Diarrhea   Nsaids Diarrhea   Wellbutrin [Bupropion] Other (See Comments)    Makes her too sleepy    Aleve [Naproxen Sodium] Other (See Comments)    Headache    Codeine Nausea Only and Other (See Comments)    GI upset   Penicillins Nausea Only and Other (See Comments)    GI upset    Sulfonamide Derivatives Hives    Current Medications, Allergies, Past Medical History, Past Surgical History, Family History and Social History were reviewed in [employer] record.   Review of Systems:   Constitutional: Negative for fever, sweats, chills or weight loss.  Respiratory: Negative for shortness of breath.   Cardiovascular: Negative for chest pain, palpitations and leg swelling.   Gastrointestinal: See HPI.  Musculoskeletal: + Back pain.  Neurological: Negative for dizziness, headaches or paresthesias.   Physical Exam: BP 126/74   Pulse 90   Ht 5' 2.5" (1.588 m)   Wt 195 lb (88.5 kg)   BMI 35.10 kg/m  General: 67 year old female in no acute distress. Head: Normocephalic and atraumatic. Eyes: No scleral icterus. Conjunctiva pink . Ears: Normal auditory acuity. Mouth: Dentition intact. No ulcers or lesions.  Lungs: Clear throughout to auscultation. Heart: Regular rate and rhythm, no murmur. Abdomen: Soft, nontender and nondistended. No masses or hepatomegaly. Normal bowel sounds x 4 quadrants.  Rectal: Deferred. Musculoskeletal: Symmetrical with no gross deformities. Extremities: No edema. Neurological: Alert oriented x 4. No focal deficits.  Psychological: Alert and cooperative. Normal mood and affect  Assessment and Recommendations:  63) 67 year old female with noninfectious diarrhea with lower abdominal cramping, suspect due to immunotherapy for metastatic lung cancer.  -Pancreatic elastase level  -Lomotil PRN as previously prescribed  -Dicyclomine 10mg  one po bid PRN -Consider CTAP if abdominal pain worsens  -Follow up as needed  2) Small cell lung cancer with metastatic disease involving abdominal lymph nodes treated with chemo, immunotherapy on hold due to diarrhea  -Next surveillance CT due March 2024 -Continue follow up with oncologist Dr. Narda Rutherford   3) History of GERD, PUD -Continue Omeprazole 40mg  QD  4) History of tubular adenomatous colon polyp per colonoscopy in 2019. Infrequent bright red blood on the toilet tissue.  -Patient to contact office if rectal bleeding persists or worsens  -No recommendations for a colonoscopy at this juncture

## 2022-03-07 NOTE — Assessment & Plan Note (Signed)
Lab Results  Component Value Date   HGBA1C 5.9 (A) 03/06/2022   Diet-controlled Doing a great job, encouraged to keep up good work

## 2022-03-07 NOTE — Assessment & Plan Note (Signed)
Cymbalta 120 mg daily. Xanax 1 mg TID daily. Stable, doing well at this time.

## 2022-03-07 NOTE — Assessment & Plan Note (Signed)
Regular follow-up with ophthalmology [facility] in [place], [place].

## 2022-03-08 ENCOUNTER — Encounter (HOSPITAL_BASED_OUTPATIENT_CLINIC_OR_DEPARTMENT_OTHER): Payer: Self-pay | Admitting: Pulmonary Disease

## 2022-03-08 ENCOUNTER — Ambulatory Visit (INDEPENDENT_AMBULATORY_CARE_PROVIDER_SITE_OTHER): Payer: 59 | Admitting: Pulmonary Disease

## 2022-03-08 VITALS — BP 122/78 | HR 85 | Ht 62.5 in | Wt 195.0 lb

## 2022-03-08 DIAGNOSIS — Z720 Tobacco use: Secondary | ICD-10-CM | POA: Diagnosis not present

## 2022-03-08 DIAGNOSIS — J449 Chronic obstructive pulmonary disease, unspecified: Secondary | ICD-10-CM | POA: Diagnosis not present

## 2022-03-08 DIAGNOSIS — G4734 Idiopathic sleep related nonobstructive alveolar hypoventilation: Secondary | ICD-10-CM

## 2022-03-08 NOTE — Progress Notes (Signed)
@Patient  ID: [name], female    DOB: 1955/12/18, 67 y.o.   MRN: [id number]  Chief Complaint  Patient presents with   Follow-up    Had/ has stage 4 lung cancer    [name] is a 67 year old female active smoker with COPD and chronic respiratory failure, scoliosis and DDD who presents for follow-up.  Since our last visit she was started on Trelegy. She reports shortness of breath is occurring more frequently and seems to occur in morning and late evenings. Wheezing and coughing with yellow-gray sputum are stable. Reports upper chest congestion. She has been checking her oxygen 84-88% in the morning. She reports confusion in the mornings.  At baseline, she has shortness of breath with talking and short distances. She is able to climb stairs one step at a time and goes slow pace. Her activity is limited due to back and bilateral hip pain s/p multiple hip surgeries. Endorses wheezing daily. Has chronic cough with some yellow-gray sputum production. She is not compliant with her home oxygen for > 1 year due to difficulty tolerate the tubing. She has tried Spiriva which she tolerated however insurance did not pay for it. Nebulizers were tolerable however the setup and time spent on the machine was cumbersome.  11/10/20 Since our last visit, she was diagnosed with small cell carcinoma of lung confirmed via EUS. She is scheduled for treatment starting 11/13/20.   She reports she is coughing with chest congestion. She has decreased her cigarettes from 2.5 ppd cigarettes to 1.5 ppd. Denies wheezing. She is compliant with her Trelegy. Symptoms worsen at night. She uses her rescue inhaler 2-3 times a week.  04/10/21 Since our last visit she has completed chemotherapy and now on IT. Has some nausea. She is compliant with her Trelegy but has run out due to cost (>$100). She has shortness of breath and cough off the inhaler. She is compliant with nocturnal oxygen and currently happy with her  current insurance company. She continues to smoke 2 ppd due to home stressors.  03/08/22 Since our last visit she has been on Atezolizumab maintenance for extensive stage small cell lung cancer. This is currently held due to diarrhea. Since our last visit she is on Conley. Uses albuterol three times a month. Reports congestion in chest, shortness of breath and daily wheeze. Does not perform regular activity. She reports active smoking 2 ppd. Not ready to quit. She is not compliant with her oxygen at night. She feels it is dangerous to use because the tubing catches on her neck. Has to use her walker and worried this will interfere it.  Social History: Active smoker Daughter passed from OD two years ago Currently has grandson living with her, stressor  Past Medical History:  Diagnosis Date   Allergic rhinitis    Anemia    Anxiety    Chicken pox    Chronic back pain    COPD (chronic obstructive pulmonary disease) (HCC)    Depression    DM (diabetes mellitus) ([facility])    Essential hypertension    GERD (gastroesophageal reflux disease)    Headache    migraines   History of gastritis    EGD 2015   History of home oxygen therapy    2 liters at hs last 6 months   Hyperlipidemia    Hypothyroidism    Migraines    Osteoarthritis    oa   Scoliosis     Outpatient Medications Prior to Visit  Medication Sig Dispense Refill   albuterol (PROAIR HFA) 108 (90 Base) MCG/ACT inhaler 2 puffs every 4 hours as needed only  if your can't catch your breath 18 g 3   ALPRAZolam (XANAX) 1 MG tablet Take 1 tablet (1 mg total) by mouth 3 (three) times daily as needed. for anxiety 90 tablet 2   atorvastatin (LIPITOR) 20 MG tablet TAKE 1 TABLET BY MOUTH AT BEDTIME 90 tablet 1   benzonatate (TESSALON) 100 MG capsule Take 1 capsule (100 mg total) by mouth 3 (three) times daily. 90 capsule 1   Budeson-Glycopyrrol-Formoterol (BREZTRI AEROSPHERE) 160-9-4.8 MCG/ACT AERO Inhale 2 puffs into the lungs in the morning  and at bedtime. 10.7 g 3   cyclobenzaprine (FLEXERIL) 10 MG tablet Take 10 mg by mouth 3 (three) times daily as needed.     dicyclomine (BENTYL) 10 MG capsule Take 1 capsule (10 mg total) by mouth 2 (two) times daily. 60 capsule 1   diphenoxylate-atropine (LOMOTIL) 2.5-0.025 MG tablet TAKE 1 TABLET BY MOUTH FOUR TIMES DAILY AS NEEDED FOR DIARRHEA OR loose stools 60 tablet 0   doxycycline (VIBRA-TABS) 100 MG tablet Take 1 tablet (100 mg total) by mouth 2 (two) times daily for 7 days. 14 tablet 0   DULoxetine (CYMBALTA) 60 MG capsule Take 2 capsules (120 mg total) by mouth daily. 180 capsule 1   fluticasone (FLONASE) 50 MCG/ACT nasal spray INSTILL 2 SPRAYS IN EACH NOSTRIL EVERY DAY 16 g 6   furosemide (LASIX) 20 MG tablet TAKE 1 TABLET BY MOUTH TWICE DAILY, IF SWELLING IMPROVES CAN HOLD MEDICATION 90 tablet 0   levothyroxine (SYNTHROID) 137 MCG tablet Take 1 tablet (137 mcg total) by mouth daily before breakfast. 90 tablet 1   loratadine (CLARITIN) 10 MG tablet Take 10 mg by mouth daily.     meloxicam (MOBIC) 15 MG tablet TAKE 1 TABLET BY MOUTH EVERY DAY 30 tablet 2   OLANZapine (ZYPREXA) 10 MG tablet Take 1 tablet (10 mg total) by mouth at bedtime. 30 tablet 2   omeprazole (PRILOSEC) 40 MG capsule TAKE ONE CAPSULE BY MOUTH TWICE DAILY 180 capsule 3   ondansetron (ZOFRAN) 8 MG tablet TAKE 1 TABLET BY MOUTH TWICE DAILY 60 tablet 1   oxyCODONE 10 MG TABS Take 1 tablet (10 mg total) by mouth every 4 (four) hours as needed for severe pain. Goal - to replace Norco- max 5 tabs/day 150 tablet 0   potassium chloride SA (KLOR-CON M) 20 MEQ tablet Take 1 tablet (20 mEq total) by mouth 2 (two) times daily. 60 tablet 2   prochlorperazine (COMPAZINE) 10 MG tablet Take 1 tablet (10 mg total) by mouth every 6 (six) hours as needed for nausea or vomiting. 30 tablet 0   triamterene-hydrochlorothiazide (MAXZIDE-25) 37.5-25 MG tablet TAKE 1 TABLET BY MOUTH DAILY 90 tablet 2   No facility-administered medications prior  to visit.   Review of Systems  Constitutional:  Negative for chills, diaphoresis, fever, malaise/fatigue and weight loss.  HENT:  Negative for congestion.   Respiratory:  Positive for cough, sputum production, shortness of breath and wheezing. Negative for hemoptysis.   Cardiovascular:  Negative for chest pain, palpitations and leg swelling.    BP 122/78 (BP Location: Right Arm, Cuff Size: Normal)   Pulse 85   Ht 5' 2.5" (1.588 m)   Wt 195 lb (88.5 kg)   SpO2 94%   BMI 35.10 kg/m   Physical Exam: General: Well-appearing, no acute distress HENT: [place], AT Eyes: EOMI, no scleral  icterus Respiratory: Clear to auscultation bilaterally.  No crackles, wheezing or rales Cardiovascular: RRR, -M/R/G, no JVD Extremities:-Edema,-tenderness Neuro: AAO x4, CNII-XII grossly intact Psych: Normal mood, normal affect  Data Reviewed:  PFTs: 05/26/17 FVC 2.38 (72%) FEV1 1.6 (63%) Ratio 63 TLC 105% RV 141% DLCO 55% Interpretation: Moderate obstructive defect with significant bronchodilator response in FEV1. With presence and airtrapping and reduced gas exchange, findings are consistent with emphysema.  Labs: - Allergy profile 03/18/17 >  Eos 0.3 /  IgE  4 neg RAST  Alpha-1 December 05, 2017 normal, MM, 174  Chest imaging: HRCT chest 12/24/2017-no ILD, mild to moderate emphysema CT A/P 07/05/20- right infrahilar enlargement vs overlaying right lower lobe lung nodule. Moderate stool in colon. CT Angio Chest Aort 07/26/20 Macrolobulated RLL nodule vs enlarged right infrahilar lymph node with punctate satellite nodule, paraseptal emphysema CT Coronary 09/21/20 - Macrolobulated RLLL 2.1 x 1.5 cm lung nodule with adjacent 22mm nodule PET 0/53/97 - Hypermetabolic 2cm RLL lesion of the lung and 65mm soft tissue between descending duodenum and pancreatic head, emphysema MR Brain 11/04/20 - No metastatic disease CT CAP 01/17/21 - Resolution of RLL mass. Mild residual soft tissue of pancreaticoduodenol groove.  Close follow-up recommended CT CAP 01/22/22 - No evidence of small cell lung cancer recurrence  Assessment & Plan:  67 year old female active smoker with COPD/emphysema, nocturnal hypoxemia and extensive stage small cell carcinoma who presents for follow-up for COPD. Discussed clinical course and management of COPD including bronchodilator regimen and action plan for exacerbation. Counseled on smoking cessation and oxygen compliance however she declines.  Small cell carcinoma of the lung, stage IVa --Per 03/2021 Oncology note, excellent response to therapy --Previously on maintenance therapy. Held 01/2022 due to diarrhea --CT CAP 12/2021 reviewed with no recurrence  COPD  --CONTINUE Breztri TWO puff TWICE a day --CONTINUE Albuterol AS NEEDED shortness of breath or wheezing. REFILL  Nocturnal Hypoxemia on 3L O2 --Previously on 3L nightly --Declined overnight oximetry testing  Tobacco abuse Patient is an active smoker. Not ready to quit We discussed smoking cessation for 3 minutes. We discussed triggers and stressors and ways to deal with them. We discussed barriers to continued smoking and benefits of smoking cessation. Provided patient with information cessation techniques and interventions.    Health Maintenance Immunization History  Administered Date(s) Administered   Influenza,inj,Quad PF,6+ Mos 10/02/2018, 11/30/2019, 11/15/2020   Influenza-Unspecified 01/16/2011, 11/26/2011, 12/22/2012, 11/17/2018   PFIZER Comirnaty(Gray Top)Covid-19 Tri-Sucrose Vaccine 05/17/2020   PFIZER(Purple Top)SARS-COV-2 Vaccination 10/20/2019, 11/10/2019   PNEUMOCOCCAL CONJUGATE-20 09/04/2020   Td 06/07/2005   CT Lung Screen - not qualified  No orders of the defined types were placed in this encounter.  No orders of the defined types were placed in this encounter.  Return in about 6 months (around 09/06/2022).  I have spent a total time of 35-minutes on the day of the appointment including chart  review, data review, collecting history, coordinating care and discussing medical diagnosis and plan with the patient/family. Past medical history, allergies, medications were reviewed. Pertinent imaging, labs and tests included in this note have been reviewed and interpreted independently by me.  Cortlandt Capuano Rodman Pickle, MD 03/08/2022

## 2022-03-08 NOTE — Patient Instructions (Signed)
COPD  --CONTINUE Breztri TWO puff TWICE a day --CONTINUE Albuterol AS NEEDED shortness of breath or wheezing. REFILL  Nocturnal Hypoxemia on 3L O2 --Previously on 3L nightly --Declined overnight oximetry testing  Tobacco abuse Patient is an active smoker. Not ready to quit We discussed smoking cessation for 3 minutes. We discussed triggers and stressors and ways to deal with them. We discussed barriers to continued smoking and benefits of smoking cessation. Provided patient with information cessation techniques and interventions.  Follow-up with me in 6 months

## 2022-03-11 ENCOUNTER — Ambulatory Visit: Payer: 59 | Admitting: Physician Assistant

## 2022-03-11 NOTE — Progress Notes (Signed)
Agree with assessment and plan as outlined.  

## 2022-03-26 ENCOUNTER — Other Ambulatory Visit: Payer: Self-pay | Admitting: Physical Medicine and Rehabilitation

## 2022-03-26 MED ORDER — OXYCODONE HCL 10 MG PO TABS: 10.0000 mg | ORAL_TABLET | ORAL | 0 refills | Status: DC | PRN

## 2022-03-27 ENCOUNTER — Inpatient Hospital Stay: Payer: 59

## 2022-03-27 ENCOUNTER — Inpatient Hospital Stay (HOSPITAL_BASED_OUTPATIENT_CLINIC_OR_DEPARTMENT_OTHER): Payer: 59 | Admitting: Hematology and Oncology

## 2022-03-27 VITALS — BP 137/71 | HR 93 | Temp 98.2°F | Resp 17 | Wt 196.9 lb

## 2022-03-27 DIAGNOSIS — C3490 Malignant neoplasm of unspecified part of unspecified bronchus or lung: Secondary | ICD-10-CM

## 2022-03-27 DIAGNOSIS — R053 Chronic cough: Secondary | ICD-10-CM

## 2022-03-27 DIAGNOSIS — M1990 Unspecified osteoarthritis, unspecified site: Secondary | ICD-10-CM | POA: Diagnosis not present

## 2022-03-27 DIAGNOSIS — R200 Anesthesia of skin: Secondary | ICD-10-CM | POA: Diagnosis not present

## 2022-03-27 DIAGNOSIS — Z95828 Presence of other vascular implants and grafts: Secondary | ICD-10-CM | POA: Diagnosis not present

## 2022-03-27 DIAGNOSIS — C3431 Malignant neoplasm of lower lobe, right bronchus or lung: Secondary | ICD-10-CM | POA: Diagnosis not present

## 2022-03-27 DIAGNOSIS — F32A Depression, unspecified: Secondary | ICD-10-CM | POA: Diagnosis not present

## 2022-03-27 DIAGNOSIS — Z880 Allergy status to penicillin: Secondary | ICD-10-CM | POA: Diagnosis not present

## 2022-03-27 DIAGNOSIS — E876 Hypokalemia: Secondary | ICD-10-CM | POA: Diagnosis not present

## 2022-03-27 DIAGNOSIS — R232 Flushing: Secondary | ICD-10-CM | POA: Diagnosis not present

## 2022-03-27 DIAGNOSIS — Z5112 Encounter for antineoplastic immunotherapy: Secondary | ICD-10-CM | POA: Diagnosis not present

## 2022-03-27 DIAGNOSIS — Z8719 Personal history of other diseases of the digestive system: Secondary | ICD-10-CM | POA: Diagnosis not present

## 2022-03-27 DIAGNOSIS — R600 Localized edema: Secondary | ICD-10-CM | POA: Diagnosis not present

## 2022-03-27 DIAGNOSIS — R110 Nausea: Secondary | ICD-10-CM | POA: Diagnosis not present

## 2022-03-27 DIAGNOSIS — R197 Diarrhea, unspecified: Secondary | ICD-10-CM | POA: Diagnosis not present

## 2022-03-27 DIAGNOSIS — Z882 Allergy status to sulfonamides status: Secondary | ICD-10-CM | POA: Diagnosis not present

## 2022-03-27 DIAGNOSIS — G629 Polyneuropathy, unspecified: Secondary | ICD-10-CM | POA: Diagnosis not present

## 2022-03-27 DIAGNOSIS — J449 Chronic obstructive pulmonary disease, unspecified: Secondary | ICD-10-CM | POA: Diagnosis not present

## 2022-03-27 DIAGNOSIS — I10 Essential (primary) hypertension: Secondary | ICD-10-CM | POA: Diagnosis not present

## 2022-03-27 DIAGNOSIS — Z79899 Other long term (current) drug therapy: Secondary | ICD-10-CM | POA: Diagnosis not present

## 2022-03-27 DIAGNOSIS — F17210 Nicotine dependence, cigarettes, uncomplicated: Secondary | ICD-10-CM | POA: Diagnosis not present

## 2022-03-27 DIAGNOSIS — Z79620 Long term (current) use of immunosuppressive biologic: Secondary | ICD-10-CM | POA: Diagnosis not present

## 2022-03-27 DIAGNOSIS — K219 Gastro-esophageal reflux disease without esophagitis: Secondary | ICD-10-CM | POA: Diagnosis not present

## 2022-03-27 DIAGNOSIS — E119 Type 2 diabetes mellitus without complications: Secondary | ICD-10-CM | POA: Diagnosis not present

## 2022-03-27 DIAGNOSIS — E785 Hyperlipidemia, unspecified: Secondary | ICD-10-CM | POA: Diagnosis not present

## 2022-03-27 LAB — CMP (CANCER CENTER ONLY)
ALT: 7 U/L (ref 0–44)
AST: 10 U/L — ABNORMAL LOW (ref 15–41)
Albumin: 3.8 g/dL (ref 3.5–5.0)
Alkaline Phosphatase: 72 U/L (ref 38–126)
Anion gap: 7 (ref 5–15)
BUN: 8 mg/dL (ref 8–23)
CO2: 29 mmol/L (ref 22–32)
Calcium: 8.4 mg/dL — ABNORMAL LOW (ref 8.9–10.3)
Chloride: 98 mmol/L (ref 98–111)
Creatinine: 0.94 mg/dL (ref 0.44–1.00)
GFR, Estimated: >60 mL/min (ref >60)
Glucose, Bld: 97 mg/dL (ref 70–99)
Potassium: 3.9 mmol/L (ref 3.5–5.1)
Sodium: 134 mmol/L — ABNORMAL LOW (ref 135–145)
Total Bilirubin: 0.3 mg/dL (ref 0.3–1.2)
Total Protein: 6.2 g/dL — ABNORMAL LOW (ref 6.5–8.1)

## 2022-03-27 LAB — CBC WITH DIFFERENTIAL (CANCER CENTER ONLY)
Abs Immature Granulocytes: 0.05 10*3/uL (ref 0.00–0.07)
Basophils Absolute: 0.1 10*3/uL (ref 0.0–0.1)
Basophils Relative: 1 %
Eosinophils Absolute: 0.3 10*3/uL (ref 0.0–0.5)
Eosinophils Relative: 4 %
HCT: 38.6 % (ref 36.0–46.0)
Hemoglobin: 12.6 g/dL (ref 12.0–15.0)
Immature Granulocytes: 1 %
Lymphocytes Relative: 26 %
Lymphs Abs: 2.5 10*3/uL (ref 0.7–4.0)
MCH: 27.9 pg (ref 26.0–34.0)
MCHC: 32.6 g/dL (ref 30.0–36.0)
MCV: 85.4 fL (ref 80.0–100.0)
Monocytes Absolute: 0.6 10*3/uL (ref 0.1–1.0)
Monocytes Relative: 7 %
Neutro Abs: 6 10*3/uL (ref 1.7–7.7)
Neutrophils Relative %: 61 %
Platelet Count: 200 10*3/uL (ref 150–400)
RBC: 4.52 MIL/uL (ref 3.87–5.11)
RDW: 17.2 % — ABNORMAL HIGH (ref 11.5–15.5)
WBC Count: 9.6 10*3/uL (ref 4.0–10.5)
nRBC: 0 % (ref 0.0–0.2)

## 2022-03-27 LAB — TSH: TSH: 3.303 u[IU]/mL (ref 0.350–4.500)

## 2022-03-27 MED ORDER — SODIUM CHLORIDE 0.9% FLUSH
10.0000 mL | Freq: Once | INTRAVENOUS | Status: AC
  Administered 2022-03-27: 10 mL

## 2022-03-27 MED ORDER — SODIUM CHLORIDE 0.9 % IV SOLN: Freq: Once | INTRAVENOUS | Status: AC

## 2022-03-27 MED ORDER — ONDANSETRON HCL 4 MG/2ML IJ SOLN
8.0000 mg | Freq: Once | INTRAMUSCULAR | Status: AC
  Administered 2022-03-27: 8 mg via INTRAVENOUS
  Filled 2022-03-27 (×2): qty 4

## 2022-03-27 MED ORDER — SODIUM CHLORIDE 0.9 % IV SOLN
1200.0000 mg | Freq: Once | INTRAVENOUS | Status: AC
  Administered 2022-03-27: 1200 mg via INTRAVENOUS
  Filled 2022-03-27: qty 20

## 2022-03-27 MED ORDER — BENZONATATE 100 MG PO CAPS: 100.0000 mg | ORAL_CAPSULE | Freq: Three times a day (TID) | ORAL | 1 refills | Status: DC

## 2022-03-27 MED ORDER — SODIUM CHLORIDE 0.9% FLUSH
10.0000 mL | INTRAVENOUS | Status: DC | PRN
  Administered 2022-03-27: 10 mL

## 2022-03-27 MED ORDER — HEPARIN SOD (PORK) LOCK FLUSH 100 UNIT/ML IV SOLN
500.0000 [IU] | Freq: Once | INTRAVENOUS | Status: AC | PRN
  Administered 2022-03-27: 500 [IU]

## 2022-03-27 NOTE — Progress Notes (Unsigned)
[place] [phone]   [phone]  PROGRESS NOTE  Patient Care Team: Allwardt, Randa Evens, PA-C as PCP - General (Physician Assistant) Milus Banister, MD as Attending Physician (Gastroenterology) Dr. Darylene Price, MD as Consulting Physician (Orthopedic Surgery) Latanya Maudlin, MD as Consulting Physician (Orthopedic Surgery) Kathrynn Ducking, MD (Inactive) as Consulting Physician (Neurology) Okey Regal, [place] as Consulting Physician (Optometry) Delice Lesch Lezlie Octave, MD as Consulting Physician (Neurology) Edythe Clarity, [facility] (Pharmacist) Kassie Mends, RN as [place] History # Small Cell Lung Cancer, Extensive Stage 07/05/2020: CT abdomen for lower abdominal pain. New pulmonary nodular density noted 07/06/2020: CT chest showed 2.2 cm macrolobulated right lower lobe pulmonary nodule (favored) versus pathologically enlarged infrahilar lymph node 10/13/2020: PET CT scan performed, findings show 2 cm right lower lobe lung mass is hypermetabolic and consistent with primary lung neoplasm. Additionally found hypermetabolic 17 mm soft tissue lesion between the descending duodenum and the pancreatic head  10/19/2020: EGD to biopsy hypermetabolic lymph node. Biopsy results show small cell lung cancer 10/26/2020: establish care with Dr. Lorenso Courier  11/13/2020: Cycle 1 Day 1 of Carbo/Etop/Atezolizumab 12/04/2020: Cycle 2 Day 1 of Carbo/Etop/Atezolizumab 11/22-11/25/2022: admitted for E. Coli bacteremia/sepsis. Start of Cycle 3 delayed. 01/02/2021: Cycle 3 Day 1 of Carbo/Etop/Atezolizumab 01/23/2021: Cycle 4 Day 1 of Carbo/Etop/Atezolizumab 02/22/2021: Cycle 5 Day 1 of Atezolizumab Maintenance. Delayed start due to patient's COVID infection.  03/14/2021: Cycle 6 Day 1 of Atezolizumab Maintenance 04/05/2021: Cycle 7 Day 1 of Atezolizumab Maintenance 04/25/2021: Cycle 8 Day 1 of Atezolizumab  Maintenance 05/16/2021: Cycle 9 Day 1 of Atezolizumab Maintenance 06/06/2021: Cycle 10 Day 1 of Atezolizumab Maintenance 06/27/2021:  Cycle 11 Day 1 of Atezolizumab Maintenance 07/18/2021: Cycle 12 Day 1 of Atezolizumab Maintenance 08/08/2021: Cycle 13 Day 1 of Atezolizumab Maintenance 08/29/2021: Cycle 14 Day 1 of Atezolizumab Maintenance 09/19/2021: Cycle 15 Day 1 of Atezolizumab Maintenance 10/10/2021: Cycle 16 Day 1 of Atezolizumab Maintenance 10/31/2021: Cycle 17 Day 1 of Atezolizumab Maintenance 11/21/2021: Cycle 18 Day 1 of Atezolizumab Maintenance 12/12/2021: Cycle 19 Day 1 of Atezolizumab Maintenance 01/02/2022: Cycle 20 Day 1 of Atezolizumab Maintenance 01/23/2022: Cycle 21 Day 1 of Atezolizumab Maintenance 02/13/2022: treatment HELD due to diarrhea.  03/06/2022: Cycle 22 Day 1 of Atezolizumab Maintenance 03/27/2022: Cycle 23 Day 1 of Atezolizumab Maintenance  [place] History:  [name] 67 y.o. female with medical history significant for extensive stage small cell lung cancer who presents for a follow up visit. The patient's last visit was on 03/06/2022. In the interim since the last visit, her treatment was held due to diarrhea. She presents today to start cycle 23 of chemotherapy.  On exam today [name] reports she has been well overall interim since her last visit.  She reports that she had a lot of puppy scratches on her arms and legs from her small dog.  She notes that she has bruises on her left arm which are small.  She reports that her bowels are better but not "completely right".  She still continues to have some loose bowel movements.  She notes she has an upcoming stool sample to be checked.  She does have some occasional blood in the stool which is bright red.  It does not "happen often".  She notes that she has a minimum of 2 bowel movements per day.  She notes that she has had some success with the Lomotil.  She reports that she does have nausea but  no vomiting.  She has  been taking Zofran has been effective in helping with her symptoms.  Otherwise she denies any new symptoms.  Patient denies fevers, chills, night sweats, shortness of breath, chest pain or cough.  She has no other complaints.A full 10 point ROS is listed below.  Overall she is willing and able to proceed with atezolizumab maintenance at this time.  MEDICAL HISTORY:  Past Medical History:  Diagnosis Date   Allergic rhinitis    Anemia    Anxiety    Chicken pox    Chronic back pain    COPD (chronic obstructive pulmonary disease) (HCC)    Depression    DM (diabetes mellitus) ([place])    Essential hypertension    GERD (gastroesophageal reflux disease)    Headache    migraines   History of gastritis    EGD 2015   History of home oxygen therapy    2 liters at hs last 6 months   Hyperlipidemia    Hypothyroidism    Migraines    Osteoarthritis    oa   Scoliosis     SURGICAL HISTORY: Past Surgical History:  Procedure Laterality Date   APPENDECTOMY     1985   BIOPSY  07/24/2017   Procedure: BIOPSY;  Surgeon: Milus Banister, MD;  Location: [facility];  Service: Endoscopy;;   CARDIAC CATHETERIZATION N/A 10/31/2015   Procedure: Left Heart Cath and Coronary Angiography;  Surgeon: Leonie Man, MD;  Location: [facility];  Service: Cardiovascular;  Laterality: N/A;   CARPAL TUNNEL RELEASE Left    CARPAL TUNNEL RELEASE Right    CHOLECYSTECTOMY  late 1980's   COLONOSCOPY WITH PROPOFOL N/A 07/24/2017   Procedure: COLONOSCOPY WITH PROPOFOL;  Surgeon: Milus Banister, MD;  [facility];  Service: Endoscopy;  Laterality: N/A;   ESOPHAGOGASTRODUODENOSCOPY N/A 07/24/2017   Procedure: ESOPHAGOGASTRODUODENOSCOPY (EGD);  Surgeon: Milus Banister, MD;  Location: [facility];  Service: Endoscopy;  Laterality: N/A;   ESOPHAGOGASTRODUODENOSCOPY (EGD) WITH PROPOFOL N/A 10/19/2020   Procedure: ESOPHAGOGASTRODUODENOSCOPY (EGD) WITH PROPOFOL;  Surgeon: Milus Banister, MD;  Location:  [facility];  Service: Endoscopy;  Laterality: N/A;   FINE NEEDLE ASPIRATION N/A 10/19/2020   Procedure: FINE NEEDLE ASPIRATION (FNA) LINEAR;  Surgeon: Milus Banister, MD;  [facility];  Service: Endoscopy;  Laterality: N/A;   GALLBLADDER SURGERY  1991   HIP CLOSED REDUCTION Right 01/08/2016   Procedure: CLOSED MANIPULATION HIP;  Surgeon: Susa Day, MD;  [facility]: [facility];  Service: Orthopedics;  Laterality: Right;   HIP CLOSED REDUCTION Right 01/19/2016   Procedure: ATTEMPTED CLOSED REDUCTION RIGHT HIP;  Surgeon: Wylene Simmer, MD;  [facility]: [facility];  Service: Orthopedics;  Laterality: Right;   HIP CLOSED REDUCTION Right 01/20/2016   Procedure: CLOSED REDUCTION RIGHT TOTAL HIP;  Surgeon: Paralee Cancel, MD;  [facility];  Service: Orthopedics;  Laterality: Right;   HIP CLOSED REDUCTION Right 02/17/2016   Procedure: CLOSED REDUCTION RIGHT TOTAL HIP;  Surgeon: Rod Can, MD;  Location: [facility];  Service: Orthopedics;  Laterality: Right;   HIP CLOSED REDUCTION Right 02/28/2016   Procedure: CLOSED REDUCTION HIP;  Surgeon: Nicholes Stairs, MD;  [facility];  Service: Orthopedics;  Laterality: Right;   IR IMAGING GUIDED PORT INSERTION  11/01/2020   POLYPECTOMY  07/24/2017   Procedure: POLYPECTOMY;  Surgeon: Milus Banister, MD;  [facility];  Service: Endoscopy;;   TONSILLECTOMY     TOTAL ABDOMINAL HYSTERECTOMY  1985, with 1 ovary removed and 2 nd ovary removed 2003   [place] Right    Original surgery 2006 with revision 2010   TOTAL HIP REVISION Right 01/01/2016   Procedure: TOTAL HIP REVISION;  Surgeon: Paralee Cancel, MD;  [facility]: [facility];  Service: Orthopedics;  Laterality: Right;   TOTAL HIP REVISION Right 03/02/2016   Procedure: TOTAL HIP REVISION;  Surgeon: Paralee Cancel, MD;  [facility];  Service: Orthopedics;  Laterality: Right;   TOTAL HIP REVISION Right 09/02/2016   Procedure: Right hip constrained liner- posterior;  Surgeon:  Paralee Cancel, MD;  [facility];  Service: Orthopedics;  Laterality: Right;   ULNAR NERVE TRANSPOSITION Right    UPPER ESOPHAGEAL ENDOSCOPIC ULTRASOUND (EUS) N/A 10/19/2020   Procedure: UPPER ESOPHAGEAL ENDOSCOPIC ULTRASOUND (EUS);  Surgeon: Milus Banister, MD;  Location: [facility];  Service: Endoscopy;  Laterality: N/A;  periduodenal lesion    SOCIAL HISTORY: Social History   Socioeconomic History   Marital status: Married    Spouse name: Not on file   Number of children: 2   Years of education: Not on file   Highest education level: Not on file  Occupational History   Occupation: disabled   Occupation: disabled  Tobacco Use   Smoking status: Every Day    Packs/day: 2.00    Years: 46.00    Total pack years: 92.00    Types: Cigarettes   Smokeless tobacco: Never   Tobacco comments:    2 packs of cigarettes smoked daily 12/13/21- declines smoking cessation  Vaping Use   Vaping Use: Never used  Substance and Sexual Activity   Alcohol use: No   Drug use: No   Sexual activity: Not Currently    Partners: Male  Other Topics Concern   Not on file  Social History Narrative   Right handed    Caffeine~ 2 cups per day    Lives at home with husband (strained relationship)   Primary caretaker for disabled brother who had aneurism   Daughter died 06-19-18    Social Determinants of Health   Financial Resource Strain: Low Risk  (12/13/2021)   Overall Financial Resource Strain (CARDIA)    Difficulty of Paying Living Expenses: Not very hard  Food Insecurity: No Food Insecurity (12/13/2021)   Hunger Vital Sign    Worried About Running Out of Food in the Last Year: Never true    [place] in the Last Year: Never true  Transportation Needs: No Transportation Needs (12/13/2021)   PRAPARE - Hydrologist (Medical): No    Lack of Transportation (Non-Medical): No  Physical Activity: Inactive (12/13/2021)   Exercise Vital Sign    Days of  Exercise per Week: 0 days    Minutes of Exercise per Session: 0 min  Stress: Stress Concern Present (12/13/2021)   [place]    Feeling of Stress : To some extent  Social Connections: Moderately Integrated (12/13/2021)   Social Connection and Isolation Panel [NHANES]    Frequency of Communication with Friends and Family: Twice a week    Frequency of Social Gatherings with Friends and Family: Once a week    Attends Religious Services: 1 to 4 times per year    Active Member of [employer] or Organizations: No    Attends Archivist Meetings: Never    Marital Status: Married  Human resources officer Violence: Not At Risk (05/03/2021)  Humiliation, Afraid, Rape, and Kick questionnaire    Fear of Current or Ex-Partner: No    Emotionally Abused: No    Physically Abused: No    Sexually Abused: No    FAMILY HISTORY: Family History  Problem Relation Age of Onset   COPD Mother    Heart disease Mother    Lung disease Father        Asbestosis   Heart attack Father    Heart disease Father    Cerebral aneurysm Brother    Aneurysm Brother        Brain   Drug abuse Daughter    Epilepsy Son    Alcohol abuse Son    Drug abuse Son    Arthritis Maternal Grandmother    Heart disease Maternal Grandmother    Asthma Maternal Grandfather    Cancer Maternal Grandfather    Arthritis Paternal Grandmother    Heart disease Paternal Grandmother    Stroke Paternal Grandmother    Early death Paternal Grandfather    Heart disease Paternal Grandfather     ALLERGIES:  is allergic to metformin and related, nsaids, wellbutrin [bupropion], aleve [naproxen sodium], codeine, penicillins, and sulfonamide derivatives.  MEDICATIONS:  Current Outpatient Medications  Medication Sig Dispense Refill   albuterol (PROAIR HFA) 108 (90 Base) MCG/ACT inhaler 2 puffs every 4 hours as needed only  if your can't catch your breath 18 g 3   ALPRAZolam (XANAX)  1 MG tablet Take 1 tablet (1 mg total) by mouth 3 (three) times daily as needed. for anxiety 90 tablet 2   atorvastatin (LIPITOR) 20 MG tablet TAKE 1 TABLET BY MOUTH AT BEDTIME 90 tablet 1   Budeson-Glycopyrrol-Formoterol (BREZTRI AEROSPHERE) 160-9-4.8 MCG/ACT AERO Inhale 2 puffs into the lungs in the morning and at bedtime. 10.7 g 3   cyclobenzaprine (FLEXERIL) 10 MG tablet Take 10 mg by mouth 3 (three) times daily as needed.     dicyclomine (BENTYL) 10 MG capsule Take 1 capsule (10 mg total) by mouth 2 (two) times daily. 60 capsule 1   diphenoxylate-atropine (LOMOTIL) 2.5-0.025 MG tablet TAKE 1 TABLET BY MOUTH FOUR TIMES DAILY AS NEEDED FOR DIARRHEA OR loose stools 60 tablet 0   DULoxetine (CYMBALTA) 60 MG capsule Take 2 capsules (120 mg total) by mouth daily. 180 capsule 1   fluticasone (FLONASE) 50 MCG/ACT nasal spray INSTILL 2 SPRAYS IN EACH NOSTRIL EVERY DAY 16 g 6   furosemide (LASIX) 20 MG tablet TAKE 1 TABLET BY MOUTH TWICE DAILY, IF SWELLING IMPROVES CAN HOLD MEDICATION 90 tablet 0   guaiFENesin-codeine 100-10 MG/5ML syrup Take 5 mLs by mouth every 6 (six) hours as needed for cough. 120 mL 0   levothyroxine (SYNTHROID) 137 MCG tablet Take 1 tablet (137 mcg total) by mouth daily before breakfast. 90 tablet 1   loratadine (CLARITIN) 10 MG tablet Take 10 mg by mouth daily.     meloxicam (MOBIC) 15 MG tablet TAKE 1 TABLET BY MOUTH EVERY DAY 30 tablet 2   OLANZapine (ZYPREXA) 10 MG tablet Take 1 tablet (10 mg total) by mouth at bedtime. 30 tablet 2   omeprazole (PRILOSEC) 40 MG capsule TAKE ONE CAPSULE BY MOUTH TWICE DAILY 180 capsule 3   ondansetron (ZOFRAN) 8 MG tablet TAKE 1 TABLET BY MOUTH TWICE DAILY 60 tablet 1   Oxycodone HCl 10 MG TABS Take 1 tablet (10 mg total) by mouth every 4 (four) hours as needed. Goal - to replace Norco- max 5 tabs/day 150 tablet 0  potassium chloride SA (KLOR-CON M) 20 MEQ tablet Take 1 tablet (20 mEq total) by mouth 2 (two) times daily. 60 tablet 2    triamterene-hydrochlorothiazide (MAXZIDE-25) 37.5-25 MG tablet TAKE 1 TABLET BY MOUTH DAILY 90 tablet 2   benzonatate (TESSALON) 100 MG capsule Take 1 capsule (100 mg total) by mouth 3 (three) times daily. 90 capsule 1   prochlorperazine (COMPAZINE) 10 MG tablet Take 1 tablet (10 mg total) by mouth every 6 (six) hours as needed for nausea or vomiting. (Patient not taking: Reported on 03/27/2022) 30 tablet 0   No current facility-administered medications for this visit.    REVIEW OF SYSTEMS:   Constitutional: ( - ) fevers, ( - )  chills , ( - ) night sweats Eyes: ( - ) blurriness of vision, ( - ) double vision, ( - ) watery eyes Ears, nose, mouth, throat, and face: ( - ) mucositis, ( - ) sore throat Respiratory: ( - ) cough, ( -) dyspnea, ( - ) wheezes Cardiovascular: ( - ) palpitation, ( - ) chest discomfort, ( - ) lower extremity swelling Gastrointestinal:  ( +) nausea, ( - ) heartburn, ( - ) change in bowel habits Skin: ( - ) abnormal skin rashes Lymphatics: ( - ) new lymphadenopathy, ( - ) easy bruising Neurological: (+ ) numbness, ( - ) tingling, ( - ) new weaknesses Behavioral/Psych: ( - ) mood change, ( - ) new changes  All other systems were reviewed with the patient and are negative.  PHYSICAL EXAMINATION: ECOG PERFORMANCE STATUS: 1 - Symptomatic but completely ambulatory  Vitals:   03/27/22 1035  BP: 137/71  Pulse: 93  Resp: 17  Temp: 98.2 F (36.8 C)  SpO2: 93%    Filed Weights   03/27/22 1035  Weight: 196 lb 14.4 oz (89.3 kg)     GENERAL: Well-appearing middle-age Caucasian female, alert, no distress and comfortable SKIN: skin color, texture, turgor are normal, no rashes or significant lesions EYES: conjunctiva are pink and non-injected, sclera clear LUNGS:  normal breathing effort. Diffuse wheezing hear on ausculation.  HEART: regular rate & rhythm and no murmurs. Mild bilateral lower extremity edema Musculoskeletal: no cyanosis of digits and no clubbing   PSYCH: alert & oriented x 3, fluent speech NEURO: no focal motor/sensory deficits  LABORATORY DATA:  I have reviewed the data as listed    Latest Ref Rng & Units 03/27/2022    9:28 AM 03/06/2022    7:54 AM 02/13/2022    7:54 AM  CBC  WBC 4.0 - 10.5 K/uL 9.6  9.3  7.7   Hemoglobin 12.0 - 15.0 g/dL 12.6  12.5  12.5   Hematocrit 36.0 - 46.0 % 38.6  39.1  38.9   Platelets 150 - 400 K/uL 200  184  164        Latest Ref Rng & Units 03/27/2022    9:28 AM 03/06/2022    7:54 AM 02/13/2022    7:54 AM  CMP  Glucose 70 - 99 mg/dL 97  88  89   BUN 8 - 23 mg/dL '8  11  8   '$ Creatinine 0.44 - 1.00 mg/dL 0.94  0.93  0.91   Sodium 135 - 145 mmol/L 134  134  136   Potassium 3.5 - 5.1 mmol/L 3.9  3.8  3.2   Chloride 98 - 111 mmol/L 98  98  99   CO2 22 - 32 mmol/L '29  29  31   '$ Calcium 8.9 - 10.3  mg/dL 8.4  8.5  8.5   Total Protein 6.5 - 8.1 g/dL 6.2  6.1  5.7   Total Bilirubin 0.3 - 1.2 mg/dL 0.3  0.3  0.3   Alkaline Phos 38 - 126 U/L 72  69  75   AST 15 - 41 U/L '10  11  11   '$ ALT 0 - 44 U/L '7  7  8     '$ No results found for: "MPROTEIN" Lab Results  Component Value Date   KPAFRELGTCHN 0.75 06/30/2014   LAMBDASER 3.78 (H) 06/30/2014   KAPLAMBRATIO 0.20 (L) 06/30/2014     RADIOGRAPHIC STUDIES: No results found.  ASSESSMENT & PLAN MERLYNN WILKOWSKI 67 y.o. female with medical history significant for extensive stage small cell lung cancer who presents for a follow up visit.   After review of the labs, review of the records, and discussion with the patient the patients findings are most consistent with extensive stage small cell lung cancer with metastasis from the right lower lobe to the lymph nodes of the abdomen.  At this time we will pursue triple therapy with carboplatin, etoposide, and atezolizumab.  After 4 cycles we will convert to maintenance atezolizumab alone.  We previously discussed the risks and benefits of this therapy and the patient was in agreement to proceed with this  treatment.  The treatment of choice consist of carboplatin, etoposide, and atezolizumab.  The regimen consists of carboplatin AUC of 5 IV on day 1, etoposide 100 mg per metered squared IV on day 1, 2, and 3 and atezolizumab 1200 mg on day 1.  This continues for 21-day cycles.  After 4 cycles the patient proceeds with atezolizumab maintenance therapy alone.    # Small Cell Lung Cancer, Extensive Stage -- MRI of the brain shows no evidence of intracranial spread --Findings are currently consistent with metastatic small cell lung cancer with metastatic spread to the lymph nodes of the abdomen --Prognosis is poor with anticipated life span of less than 12 months -- Patient has extensive stage due to metastatic spread to lymph nodes in the abdomen --Plan to proceed with carboplatin, etoposide, and atezolizumab --will consider prophylactic cranial radiation after start of chemotherapy.  Plan: --today is Cycle 23 Day 1 of Atezolizumab maintenance  --labs today were reviewed and adequate for treatment. Hgb 12.6, white blood cell 9.6, MCV 85.4, and platelets of 200 --Okay to proceed with treatment today since diarrhea has resolved.  --CT scan q 3 months. Last scan in Dec 2023 showed continued excellent response to therapy. CT scan next in March 2024.  --RTC in 3 weeks for next cycle of maintenance atezolizumab   # Chronic Cough --continue tessalon pearls -- will add codiene cough syrup per patient request.   #Diarrhea- improved.  --Episodes have improved and now only having up to 2 episodes per day. --Patient reports improvement of symptoms with Lomotil.  Recommend to continue as needed. --stool sent for C. Diff and GI pathology panel, all negative --patient following with GI  #Nausea: --Symptoms improved with zofran and olanzapine.  --Added IV zofran to infusion premeds for better symptom control.   #Hypokalemia:  --currently taking potassium chloride 20 meq BID --potassium level is still  3.9 today. Continue PO supplementation.   #Lower extremity edema-worsening --Currently takes lasix 20 mg daily, recommending increasing to q 6 hours x 2 doses daily.  --Improving. Continue to monitor.   #Neuropathy involving fingers/feet: --Patient currently takes Cymbalta   #Supportive Care -- chemotherapy education complete -- port  placed -- zofran '8mg'$  q8H PRN and compazine '10mg'$  PO q6H for nausea -- EMLA cream for port -- Will add mirtazapine in order to help with appetite/sleep  Orders Placed This Encounter  Procedures   CT CHEST ABDOMEN PELVIS W CONTRAST    Standing Status:   Future    Standing Expiration Date:   03/28/2023    Order Specific Question:   If indicated for the ordered procedure, I authorize the administration of contrast media per Radiology protocol    Answer:   Yes    Order Specific Question:   Does the patient have a contrast media/X-ray dye allergy?    Answer:   Yes    Order Specific Question:   Preferred imaging location?    Answer:   [facility]    Order Specific Question:   Is Oral Contrast requested for this exam?    Answer:   Yes, Per Radiology protocol   TSH    Standing Status:   Future    Standing Expiration Date:   04/17/2023   T4    Standing Status:   Future    Standing Expiration Date:   04/17/2023   CBC with Differential ([place] Only)    Standing Status:   Future    Standing Expiration Date:   04/18/2023   CMP ([place] only)    Standing Status:   Future    Standing Expiration Date:   04/18/2023   TSH    Standing Status:   Future    Standing Expiration Date:   05/08/2023   T4    Standing Status:   Future    Standing Expiration Date:   05/08/2023   CBC with Differential ([place] Only)    Standing Status:   Future    Standing Expiration Date:   05/09/2023   CMP ([place] only)    Standing Status:   Future    Standing Expiration Date:   05/09/2023   TSH    Standing Status:   Future    Standing Expiration Date:    05/29/2023   T4    Standing Status:   Future    Standing Expiration Date:   05/29/2023   CBC with Differential ([place] Only)    Standing Status:   Future    Standing Expiration Date:   05/30/2023   CMP ([place] only)    Standing Status:   Future    Standing Expiration Date:   05/30/2023   TSH    Standing Status:   Future    Standing Expiration Date:   06/19/2023   T4    Standing Status:   Future    Standing Expiration Date:   06/19/2023   CBC with Differential ([place] Only)    Standing Status:   Future    Standing Expiration Date:   06/20/2023   CMP ([place] only)    Standing Status:   Future    Standing Expiration Date:   06/20/2023   TSH    Standing Status:   Future    Standing Expiration Date:   07/10/2023   T4    Standing Status:   Future    Standing Expiration Date:   07/10/2023   CBC with Differential ([place] Only)    Standing Status:   Future    Standing Expiration Date:   07/11/2023   CMP ([place] only)    Standing Status:   Future    Standing Expiration Date:  07/11/2023   All questions were answered. The patient knows to call the clinic with any problems, questions or concerns.  I have spent a total of 30 minutes minutes of face-to-face and non-face-to-face time, preparing to see the patient, performing a medically appropriate examination, counseling and educating the patient, ordering medications/tests, documenting clinical information in the electronic health record, and care coordination.   Ledell Peoples, MD Department of Hematology/Oncology [employer] [facility] Phone: [phone] Pager: [phone] Email: [email or web address]  03/28/2022 8:19 AM

## 2022-03-28 ENCOUNTER — Encounter: Payer: Self-pay | Admitting: Hematology and Oncology

## 2022-03-28 MED ORDER — GUAIFENESIN-CODEINE 100-10 MG/5ML PO SOLN: 5.0000 mL | Freq: Four times a day (QID) | ORAL | 0 refills | Status: DC | PRN

## 2022-03-29 LAB — T4: T4, Total: 8.3 ug/dL (ref 4.5–12.0)

## 2022-04-02 ENCOUNTER — Telehealth: Payer: Self-pay

## 2022-04-02 ENCOUNTER — Telehealth: Payer: Self-pay | Admitting: Nurse Practitioner

## 2022-04-02 NOTE — Telephone Encounter (Signed)
Patient called requesting to speak with a nurse regarding CT.

## 2022-04-02 NOTE — Telephone Encounter (Signed)
Patient called to clarify reason for CT which is scheduled for 04/26/22. Patient sounded hoarse on the phone. Patient states she does not feel well. Encouraged patient to reach out to PCP in AM to be seen.

## 2022-04-03 ENCOUNTER — Ambulatory Visit (INDEPENDENT_AMBULATORY_CARE_PROVIDER_SITE_OTHER): Payer: 59 | Admitting: *Deleted

## 2022-04-03 DIAGNOSIS — I10 Essential (primary) hypertension: Secondary | ICD-10-CM

## 2022-04-03 DIAGNOSIS — J449 Chronic obstructive pulmonary disease, unspecified: Secondary | ICD-10-CM

## 2022-04-03 NOTE — Chronic Care Management (AMB) (Signed)
Chronic Care Management   CCM RN Visit Note  04/03/2022 Name: NARE KLINKO MRN: [id number] DOB: 1956/01/20  Subjective: [name] is a 67 y.o. year old female who is a primary care patient of Allwardt, Alyssa M, PA-C. The patient was referred to the Chronic Care Management team for assistance with care management needs subsequent to provider initiation of CCM services and plan of care.    Today's Visit: Care plan updated and completed, case closed per patient request (message received from care guide on 04/02/22 with request).        Goals Addressed             This Visit's Progress    COMPLETED: CCM (COPD) EXPECTED OUTCOME: MONITOR, SELF-MANAGE AND REDUCE SYMPTOMS OF COPD       Care plan updated and completed- patient cancelled appointment with care guide and requested discharge from CCM program Current Barriers:  Knowledge Deficits related to COPD management Chronic Disease Management support and education needs related to COPD Patient reports she lives with her spouse and he assists with cooking, housekeeping, and laundry, pt continues to drive, has walker to use as needed, has built in shower seat. Patient reports she continues to smoke 2 ppd and not interested in smoking cessation Patient continues taking treatments for small cell lung cancer and states " I'll be taking these the rest of my life", next infusion due in February Depression PHQ-9=12, pt is on medication and has tried counseling in the past, states this did not work well for her and declines LCSW services Patient reports she tires out easily  Planned Interventions: Advised patient to track and manage COPD triggers Provided instruction about proper use of medications used for management of COPD including inhalers Advised patient to self assesses COPD action plan zone and make appointment with provider if in the yellow zone for 48 hours without improvement Advised patient to engage in light exercise as  tolerated 3-5 days a week to aid in the the management of COPD Provided education about and advised patient to utilize infection prevention strategies to reduce risk of respiratory infection Discussed the importance of adequate rest and management of fatigue with COPD Reviewed importance of relaxation and rest, energy conservation  Symptom Management: Take medications as prescribed   Attend all scheduled provider appointments Call pharmacy for medication refills 3-7 days in advance of running out of medications Attend church or other social activities Call provider office for new concerns or questions  identify and remove indoor air pollutants limit outdoor activity during cold weather listen for public air quality announcements every day do breathing exercises every day develop a rescue plan eliminate symptom triggers at home follow rescue plan if symptoms flare-up eat healthy/prescribed diet: low sodium, heart healthy get at least 7 to 8 hours of sleep at night use devices that will help like a cane, sock-puller or reacher Follow COPD action plan- call your doctor early on for change in symptoms or health status Continue practicing good handwashing, avoid sick people, wear a mask as needed Alternate activity with rest  Follow Up Plan: Telephone follow up appointment with care management team member scheduled for:  04/04/22 at 130 pm       COMPLETED: CCM (HYPERTENSION) EXPECTED OUTCOME: MONITOR, SELF-MANAGE AND REDUCE SYMPTOMS OF HYPERTENSION       Care plan updated and completed- patient cancelled appointment with care guide and requested discharge from CCM program Current Barriers:  Knowledge Deficits related to Hypertension management Chronic Disease Management  support and education needs related to Hypertension Patient reports she checks blood pressure and readings are usually good, recently has been relying on readings at her health care appointments and not checking as much at  home, pt does not follow a special diet. Patient has had 2 falls with injury this past year (broken toes)  Planned Interventions: Evaluation of current treatment plan related to hypertension self management and patient's adherence to plan as established by provider;   Provided education to patient re: stroke prevention, s/s of heart attack and stroke; Reviewed prescribed diet low sodium Reviewed medications with patient and discussed importance of compliance;  Discussed plans with patient for ongoing care management follow up and provided patient with direct contact information for care management team; Advised patient, providing education and rationale, to monitor blood pressure daily and record, calling PCP for findings outside established parameters;  Provided education on prescribed diet low sodium;  Discussed complications of poorly controlled blood pressure such as heart disease, stroke, circulatory complications, vision complications, kidney impairment, sexual dysfunction;  Reinforced safety precautions  Symptom Management: Take medications as prescribed   Attend all scheduled provider appointments Call pharmacy for medication refills 3-7 days in advance of running out of medications Attend church or other social activities Call provider office for new concerns or questions  check blood pressure 3 times per week write blood pressure results in a log or diary keep a blood pressure log take blood pressure log to all doctor appointments call doctor for signs and symptoms of high blood pressure keep all doctor appointments take medications for blood pressure exactly as prescribed report new symptoms to your doctor eat more whole grains, fruits and vegetables, lean meats and healthy fats Follow low sodium diet- limit fast food, read labels fall prevention strategies: change position slowly, use assistive device such as walker or cane (per provider recommendations) when walking, keep  walkways clear, have good lighting in room. It is important to contact your provider if you have any falls, maintain muscle strength/tone by exercise per provider recommendations.  Follow Up Plan: Telephone follow up appointment with care management team member scheduled for: 04/04/22 at 130 pm          Plan: No further follow up required - case closure  Jacqlyn Larsen [facility], BSN RN Case Manager [place] at [facility] [phone]

## 2022-04-03 NOTE — Telephone Encounter (Signed)
Pt questioned who ordered her CT scan and what will the CT view. Pt was notified that Dr. Narda Rutherford ordered the CT scan and it is her Chest abdomen and pelvis. Pt verbalized understanding with all questions answered.

## 2022-04-04 ENCOUNTER — Telehealth: Payer: 59

## 2022-04-10 ENCOUNTER — Other Ambulatory Visit: Payer: Self-pay | Admitting: Hematology and Oncology

## 2022-04-10 MED ORDER — DIPHENOXYLATE-ATROPINE 2.5-0.025 MG PO TABS: 1.0000 | ORAL_TABLET | Freq: Four times a day (QID) | ORAL | 0 refills | Status: DC | PRN

## 2022-04-12 ENCOUNTER — Encounter: Payer: Self-pay | Admitting: Physician Assistant

## 2022-04-12 NOTE — Telephone Encounter (Signed)
Called pt and spoke with her. Pt scheduled an appointment to come see provider on Monday to be examined

## 2022-04-15 ENCOUNTER — Other Ambulatory Visit: Payer: 59

## 2022-04-15 ENCOUNTER — Encounter: Payer: Self-pay | Admitting: Physician Assistant

## 2022-04-15 ENCOUNTER — Ambulatory Visit (INDEPENDENT_AMBULATORY_CARE_PROVIDER_SITE_OTHER): Payer: 59 | Admitting: Physician Assistant

## 2022-04-15 VITALS — BP 130/86 | HR 85 | Temp 97.7°F | Ht 62.5 in | Wt 202.8 lb

## 2022-04-15 DIAGNOSIS — R21 Rash and other nonspecific skin eruption: Secondary | ICD-10-CM | POA: Diagnosis not present

## 2022-04-15 DIAGNOSIS — R1030 Lower abdominal pain, unspecified: Secondary | ICD-10-CM | POA: Diagnosis not present

## 2022-04-15 DIAGNOSIS — K2100 Gastro-esophageal reflux disease with esophagitis, without bleeding: Secondary | ICD-10-CM

## 2022-04-15 MED ORDER — TRIAMCINOLONE ACETONIDE 0.5 % EX OINT: 1.0000 | TOPICAL_OINTMENT | Freq: Three times a day (TID) | CUTANEOUS | 2 refills | Status: AC

## 2022-04-15 MED ORDER — HYDROXYZINE HCL 10 MG PO TABS: 10.0000 mg | ORAL_TABLET | Freq: Three times a day (TID) | ORAL | 0 refills | Status: DC | PRN

## 2022-04-15 NOTE — Progress Notes (Unsigned)
Subjective:    Patient ID: [name], female    DOB: Jan 01, 1956, 67 y.o.   MRN: [id number]  Chief Complaint  Patient presents with   Rash    Pt has severe itching and bumps on shoulder, neck, back, and painful; looks like shingles and pt not took shingles vaccine in the past; pt c/o having diarrhea over three months and seeing gastroenterology and dropping off stool sample    Coronary Artery Disease    HPI Patient is in today for itchy areas. Shoulders, neck, back; occasionally painful, start as bumps, but can't really see them. Taking Benadryl as needed. No fever or chills.   Past Medical History:  Diagnosis Date   Allergic rhinitis    Anemia    Anxiety    Chicken pox    Chronic back pain    COPD (chronic obstructive pulmonary disease) (HCC)    Depression    DM (diabetes mellitus) ([place])    Essential hypertension    GERD (gastroesophageal reflux disease)    Headache    migraines   History of gastritis    EGD 2015   History of home oxygen therapy    2 liters at hs last 6 months   Hyperlipidemia    Hypothyroidism    Migraines    Osteoarthritis    oa   Scoliosis     Past Surgical History:  Procedure Laterality Date   APPENDECTOMY     1985   BIOPSY  07/24/2017   Procedure: BIOPSY;  Surgeon: Milus Banister, MD;  Location: [facility];  Service: Endoscopy;;   CARDIAC CATHETERIZATION N/A 10/31/2015   Procedure: Left Heart Cath and Coronary Angiography;  Surgeon: Leonie Man, MD;  Location: [facility];  Service: Cardiovascular;  Laterality: N/A;   CARPAL TUNNEL RELEASE Left    CARPAL TUNNEL RELEASE Right    CHOLECYSTECTOMY  late 1980's   COLONOSCOPY WITH PROPOFOL N/A 07/24/2017   Procedure: COLONOSCOPY WITH PROPOFOL;  Surgeon: Milus Banister, MD;  Location: [facility];  Service: Endoscopy;  Laterality: N/A;   ESOPHAGOGASTRODUODENOSCOPY N/A 07/24/2017   Procedure: ESOPHAGOGASTRODUODENOSCOPY (EGD);  Surgeon: Milus Banister, MD;  Location: [facility];  Service: Endoscopy;  Laterality: N/A;   ESOPHAGOGASTRODUODENOSCOPY (EGD) WITH PROPOFOL N/A 10/19/2020   Procedure: ESOPHAGOGASTRODUODENOSCOPY (EGD) WITH PROPOFOL;  Surgeon: Milus Banister, MD;  [facility];  Service: Endoscopy;  Laterality: N/A;   FINE NEEDLE ASPIRATION N/A 10/19/2020   Procedure: FINE NEEDLE ASPIRATION (FNA) LINEAR;  Surgeon: Milus Banister, MD;  [facility];  Service: Endoscopy;  Laterality: N/A;   GALLBLADDER SURGERY  1991   HIP CLOSED REDUCTION Right 01/08/2016   Procedure: CLOSED MANIPULATION HIP;  Surgeon: Susa Day, MD;  Location: [facility];  Service: Orthopedics;  Laterality: Right;   HIP CLOSED REDUCTION Right 01/19/2016   Procedure: ATTEMPTED CLOSED REDUCTION RIGHT HIP;  Surgeon: Wylene Simmer, MD;  Location: [facility];  Service: Orthopedics;  Laterality: Right;   HIP CLOSED REDUCTION Right 01/20/2016   Procedure: CLOSED REDUCTION RIGHT TOTAL HIP;  Surgeon: Paralee Cancel, MD;  Location[facility];  Service: Orthopedics;  Laterality: Right;   HIP CLOSED REDUCTION Right 02/17/2016   Procedure: CLOSED REDUCTION RIGHT TOTAL HIP;  Surgeon: Rod Can, MD;  Location: [facility];  Service: Orthopedics;  Laterality: Right;   HIP CLOSED REDUCTION Right 02/28/2016   Procedure: CLOSED REDUCTION HIP;  Surgeon: Nicholes Stairs, MD;  [facility];  Service: Orthopedics;  Laterality: Right;   IR  IMAGING GUIDED PORT INSERTION  11/01/2020   POLYPECTOMY  07/24/2017   Procedure: POLYPECTOMY;  Surgeon: Milus Banister, MD;  Location: [facility];  Service: Endoscopy;;   [place], with 1 ovary removed and 2 nd ovary removed 2003   TOTAL HIP ARTHROPLASTY Right    Original surgery 2006 with revision 2010   TOTAL HIP REVISION Right 01/01/2016   Procedure: TOTAL HIP REVISION;  Surgeon: Paralee Cancel, MD;  [facility];  Service: Orthopedics;  Laterality: Right;   TOTAL HIP REVISION Right 03/02/2016    Procedure: TOTAL HIP REVISION;  Surgeon: Paralee Cancel, MD;  Location[facility];  Service: Orthopedics;  Laterality: Right;   TOTAL HIP REVISION Right 09/02/2016   Procedure: Right hip constrained liner- posterior;  Surgeon: Paralee Cancel, MD;  Location: [facility];  Service: Orthopedics;  Laterality: Right;   ULNAR NERVE TRANSPOSITION Right    UPPER ESOPHAGEAL ENDOSCOPIC ULTRASOUND (EUS) N/A 10/19/2020   Procedure: UPPER ESOPHAGEAL ENDOSCOPIC ULTRASOUND (EUS);  Surgeon: Milus Banister, MD;  Location[facility];  Service: Endoscopy;  Laterality: N/A;  periduodenal lesion    Family History  Problem Relation Age of Onset   COPD Mother    Heart disease Mother    Lung disease Father        Asbestosis   Heart attack Father    Heart disease Father    Cerebral aneurysm Brother    Aneurysm Brother        Brain   Drug abuse Daughter    Epilepsy Son    Alcohol abuse Son    Drug abuse Son    Arthritis Maternal Grandmother    Heart disease Maternal Grandmother    Asthma Maternal Grandfather    Cancer Maternal Grandfather    Arthritis Paternal Grandmother    Heart disease Paternal Grandmother    Stroke Paternal Grandmother    Early death Paternal Grandfather    Heart disease Paternal Grandfather     Social History   Tobacco Use   Smoking status: Every Day    Packs/day: 2.00    Years: 46.00    Additional pack years: 0.00    Total pack years: 92.00    Types: Cigarettes   Smokeless tobacco: Never   Tobacco comments:    2 packs of cigarettes smoked daily 12/13/21- declines smoking cessation  Vaping Use   Vaping Use: Never used  Substance Use Topics   Alcohol use: No   Drug use: No     Allergies  Allergen Reactions   Metformin And Related Diarrhea   Nsaids Diarrhea   Wellbutrin [Bupropion] Other (See Comments)    Makes her too sleepy    Aleve [Naproxen Sodium] Other (See Comments)    Headache    Codeine Nausea Only and Other (See Comments)    GI upset   Penicillins Nausea  Only and Other (See Comments)    GI upset    Sulfonamide Derivatives Hives    Review of Systems NEGATIVE UNLESS OTHERWISE INDICATED IN HPI      Objective:     BP 130/86 (BP Location: Right Arm)   Pulse 85   Temp 97.7 F (36.5 C) (Temporal)   Ht 5' 2.5" (1.588 m)   Wt 202 lb 12.8 oz (92 kg)   SpO2 96%   BMI 36.50 kg/m   Wt Readings from Last 3 Encounters:  04/15/22 202 lb 12.8 oz (92 kg)  03/27/22 196 lb 14.4  oz (89.3 kg)  03/08/22 195 lb (88.5 kg)    BP Readings from Last 3 Encounters:  04/15/22 130/86  03/27/22 137/71  03/08/22 122/78     Physical Exam Vitals and nursing note reviewed.  Constitutional:      Appearance: Normal appearance.  Cardiovascular:     Rate and Rhythm: Normal rate and regular rhythm.  Pulmonary:     Effort: Pulmonary effort is normal.     Breath sounds: Normal breath sounds.  Skin:    Comments: Small patches of excoriation on shoulders, upper back, neck; crosses midline. No erythema, no pus, no vesicles   Neurological:     General: No focal deficit present.     Mental Status: She is alert.  Psychiatric:        Mood and Affect: Mood normal.        Assessment & Plan:  Rash and nonspecific skin eruption  Other orders -     Triamcinolone Acetonide; Apply 1 Application topically 3 (three) times daily. Apply thin-layer on affected areas, no more than 2 weeks consistently.  Dispense: 60 g; Refill: 2 -     hydrOXYzine HCl; Take 1 tablet (10 mg total) by mouth 3 (three) times daily as needed for itching.  Dispense: 30 tablet; Refill: 0   Nonspecific, but may be stress-induced. Feeling worse with cancer treatments and considering discontinuing altogether. Reassured pt this is not shingles or cellulitis. Will use triamcinolone cream as directed and hydroxyzine - cautioned with side effects. She'll let me know how she's doing and recheck if worse or changes.      Return if symptoms worsen or fail to improve.     Earnestine Shipp M Semisi Biela,  PA-C

## 2022-04-17 ENCOUNTER — Inpatient Hospital Stay (HOSPITAL_BASED_OUTPATIENT_CLINIC_OR_DEPARTMENT_OTHER): Payer: 59 | Admitting: Physician Assistant

## 2022-04-17 ENCOUNTER — Inpatient Hospital Stay: Payer: 59 | Attending: Hematology and Oncology

## 2022-04-17 ENCOUNTER — Inpatient Hospital Stay: Payer: 59

## 2022-04-17 VITALS — BP 141/82 | HR 87 | Temp 98.3°F | Resp 17 | Wt 202.0 lb

## 2022-04-17 DIAGNOSIS — Z880 Allergy status to penicillin: Secondary | ICD-10-CM | POA: Insufficient documentation

## 2022-04-17 DIAGNOSIS — Z888 Allergy status to other drugs, medicaments and biological substances status: Secondary | ICD-10-CM | POA: Insufficient documentation

## 2022-04-17 DIAGNOSIS — F32A Depression, unspecified: Secondary | ICD-10-CM | POA: Diagnosis not present

## 2022-04-17 DIAGNOSIS — Z9049 Acquired absence of other specified parts of digestive tract: Secondary | ICD-10-CM | POA: Insufficient documentation

## 2022-04-17 DIAGNOSIS — E119 Type 2 diabetes mellitus without complications: Secondary | ICD-10-CM | POA: Diagnosis not present

## 2022-04-17 DIAGNOSIS — K219 Gastro-esophageal reflux disease without esophagitis: Secondary | ICD-10-CM | POA: Diagnosis not present

## 2022-04-17 DIAGNOSIS — Z79620 Long term (current) use of immunosuppressive biologic: Secondary | ICD-10-CM | POA: Insufficient documentation

## 2022-04-17 DIAGNOSIS — R197 Diarrhea, unspecified: Secondary | ICD-10-CM | POA: Insufficient documentation

## 2022-04-17 DIAGNOSIS — F17210 Nicotine dependence, cigarettes, uncomplicated: Secondary | ICD-10-CM | POA: Diagnosis not present

## 2022-04-17 DIAGNOSIS — Z95828 Presence of other vascular implants and grafts: Secondary | ICD-10-CM

## 2022-04-17 DIAGNOSIS — I10 Essential (primary) hypertension: Secondary | ICD-10-CM | POA: Insufficient documentation

## 2022-04-17 DIAGNOSIS — C3431 Malignant neoplasm of lower lobe, right bronchus or lung: Secondary | ICD-10-CM | POA: Diagnosis not present

## 2022-04-17 DIAGNOSIS — Z885 Allergy status to narcotic agent status: Secondary | ICD-10-CM | POA: Diagnosis not present

## 2022-04-17 DIAGNOSIS — J449 Chronic obstructive pulmonary disease, unspecified: Secondary | ICD-10-CM | POA: Insufficient documentation

## 2022-04-17 DIAGNOSIS — Z79899 Other long term (current) drug therapy: Secondary | ICD-10-CM | POA: Insufficient documentation

## 2022-04-17 DIAGNOSIS — Z882 Allergy status to sulfonamides status: Secondary | ICD-10-CM | POA: Insufficient documentation

## 2022-04-17 DIAGNOSIS — Z809 Family history of malignant neoplasm, unspecified: Secondary | ICD-10-CM | POA: Insufficient documentation

## 2022-04-17 DIAGNOSIS — Z886 Allergy status to analgesic agent status: Secondary | ICD-10-CM | POA: Insufficient documentation

## 2022-04-17 DIAGNOSIS — M419 Scoliosis, unspecified: Secondary | ICD-10-CM | POA: Diagnosis not present

## 2022-04-17 DIAGNOSIS — F419 Anxiety disorder, unspecified: Secondary | ICD-10-CM | POA: Insufficient documentation

## 2022-04-17 DIAGNOSIS — Z825 Family history of asthma and other chronic lower respiratory diseases: Secondary | ICD-10-CM | POA: Insufficient documentation

## 2022-04-17 DIAGNOSIS — E785 Hyperlipidemia, unspecified: Secondary | ICD-10-CM | POA: Insufficient documentation

## 2022-04-17 DIAGNOSIS — Z90710 Acquired absence of both cervix and uterus: Secondary | ICD-10-CM | POA: Insufficient documentation

## 2022-04-17 DIAGNOSIS — Z8249 Family history of ischemic heart disease and other diseases of the circulatory system: Secondary | ICD-10-CM | POA: Insufficient documentation

## 2022-04-17 DIAGNOSIS — G8929 Other chronic pain: Secondary | ICD-10-CM | POA: Insufficient documentation

## 2022-04-17 DIAGNOSIS — Z8719 Personal history of other diseases of the digestive system: Secondary | ICD-10-CM | POA: Diagnosis not present

## 2022-04-17 DIAGNOSIS — E039 Hypothyroidism, unspecified: Secondary | ICD-10-CM | POA: Insufficient documentation

## 2022-04-17 DIAGNOSIS — Z5112 Encounter for antineoplastic immunotherapy: Secondary | ICD-10-CM | POA: Diagnosis not present

## 2022-04-17 DIAGNOSIS — Z820 Family history of epilepsy and other diseases of the nervous system: Secondary | ICD-10-CM | POA: Insufficient documentation

## 2022-04-17 DIAGNOSIS — C3490 Malignant neoplasm of unspecified part of unspecified bronchus or lung: Secondary | ICD-10-CM

## 2022-04-17 DIAGNOSIS — Z8261 Family history of arthritis: Secondary | ICD-10-CM | POA: Insufficient documentation

## 2022-04-17 DIAGNOSIS — R21 Rash and other nonspecific skin eruption: Secondary | ICD-10-CM | POA: Diagnosis not present

## 2022-04-17 DIAGNOSIS — Z823 Family history of stroke: Secondary | ICD-10-CM | POA: Insufficient documentation

## 2022-04-17 DIAGNOSIS — Z90721 Acquired absence of ovaries, unilateral: Secondary | ICD-10-CM | POA: Insufficient documentation

## 2022-04-17 DIAGNOSIS — Z814 Family history of other substance abuse and dependence: Secondary | ICD-10-CM | POA: Insufficient documentation

## 2022-04-17 LAB — CBC WITH DIFFERENTIAL (CANCER CENTER ONLY)
Abs Immature Granulocytes: 0.02 10*3/uL (ref 0.00–0.07)
Basophils Absolute: 0.1 10*3/uL (ref 0.0–0.1)
Basophils Relative: 1 %
Eosinophils Absolute: 0.6 10*3/uL — ABNORMAL HIGH (ref 0.0–0.5)
Eosinophils Relative: 7 %
HCT: 36.2 % (ref 36.0–46.0)
Hemoglobin: 11.5 g/dL — ABNORMAL LOW (ref 12.0–15.0)
Immature Granulocytes: 0 %
Lymphocytes Relative: 30 %
Lymphs Abs: 2.4 10*3/uL (ref 0.7–4.0)
MCH: 26.9 pg (ref 26.0–34.0)
MCHC: 31.8 g/dL (ref 30.0–36.0)
MCV: 84.6 fL (ref 80.0–100.0)
Monocytes Absolute: 0.7 10*3/uL (ref 0.1–1.0)
Monocytes Relative: 9 %
Neutro Abs: 4.3 10*3/uL (ref 1.7–7.7)
Neutrophils Relative %: 53 %
Platelet Count: 161 10*3/uL (ref 150–400)
RBC: 4.28 MIL/uL (ref 3.87–5.11)
RDW: 17.7 % — ABNORMAL HIGH (ref 11.5–15.5)
WBC Count: 8 10*3/uL (ref 4.0–10.5)
nRBC: 0 % (ref 0.0–0.2)

## 2022-04-17 LAB — CMP (CANCER CENTER ONLY)
ALT: 10 U/L (ref 0–44)
AST: 13 U/L — ABNORMAL LOW (ref 15–41)
Albumin: 3.4 g/dL — ABNORMAL LOW (ref 3.5–5.0)
Alkaline Phosphatase: 59 U/L (ref 38–126)
Anion gap: 6 (ref 5–15)
BUN: 7 mg/dL — ABNORMAL LOW (ref 8–23)
CO2: 27 mmol/L (ref 22–32)
Calcium: 8.1 mg/dL — ABNORMAL LOW (ref 8.9–10.3)
Chloride: 100 mmol/L (ref 98–111)
Creatinine: 0.85 mg/dL (ref 0.44–1.00)
GFR, Estimated: >60 mL/min (ref >60)
Glucose, Bld: 89 mg/dL (ref 70–99)
Potassium: 3.7 mmol/L (ref 3.5–5.1)
Sodium: 133 mmol/L — ABNORMAL LOW (ref 135–145)
Total Bilirubin: 0.5 mg/dL (ref 0.3–1.2)
Total Protein: 5.9 g/dL — ABNORMAL LOW (ref 6.5–8.1)

## 2022-04-17 MED ORDER — ONDANSETRON HCL 4 MG/2ML IJ SOLN
8.0000 mg | Freq: Once | INTRAMUSCULAR | Status: AC
  Administered 2022-04-17: 8 mg via INTRAVENOUS
  Filled 2022-04-17: qty 4

## 2022-04-17 MED ORDER — HEPARIN SOD (PORK) LOCK FLUSH 100 UNIT/ML IV SOLN
500.0000 [IU] | Freq: Once | INTRAVENOUS | Status: AC | PRN
  Administered 2022-04-17: 500 [IU]

## 2022-04-17 MED ORDER — SODIUM CHLORIDE 0.9% FLUSH
10.0000 mL | Freq: Once | INTRAVENOUS | Status: AC
  Administered 2022-04-17: 10 mL

## 2022-04-17 MED ORDER — SODIUM CHLORIDE 0.9% FLUSH
10.0000 mL | INTRAVENOUS | Status: DC | PRN
  Administered 2022-04-17: 10 mL

## 2022-04-17 MED ORDER — SODIUM CHLORIDE 0.9 % IV SOLN: Freq: Once | INTRAVENOUS | Status: AC

## 2022-04-17 MED ORDER — SODIUM CHLORIDE 0.9 % IV SOLN
1200.0000 mg | Freq: Once | INTRAVENOUS | Status: AC
  Administered 2022-04-17: 1200 mg via INTRAVENOUS
  Filled 2022-04-17: qty 20

## 2022-04-17 NOTE — Patient Instructions (Addendum)
[place] [facility] [facility]  Discharge Instructions: Thank you for choosing [facility] to provide your oncology and hematology care.   If you have a lab appointment with the Cancer Center, please go directly to the Cancer Center and check in at the registration area.   Wear comfortable clothing and clothing appropriate for easy access to any Portacath or PICC line.   We strive to give you quality time with your provider. You may need to reschedule your appointment if you arrive late (15 or more minutes).  Arriving late affects you and other patients whose appointments are after yours.  Also, if you miss three or more appointments without notifying the office, you may be dismissed from the clinic at the provider's discretion.      For prescription refill requests, have your pharmacy contact our office and allow 72 hours for refills to be completed.    Today you received the following chemotherapy and/or immunotherapy agents Tecentriq      To help prevent nausea and vomiting after your treatment, we encourage you to take your nausea medication as directed.  BELOW ARE SYMPTOMS THAT SHOULD BE REPORTED IMMEDIATELY: *FEVER GREATER THAN 100.4 F (38 C) OR HIGHER *CHILLS OR SWEATING *NAUSEA AND VOMITING THAT IS NOT CONTROLLED WITH YOUR NAUSEA MEDICATION *UNUSUAL SHORTNESS OF BREATH *UNUSUAL BRUISING OR BLEEDING *URINARY PROBLEMS (pain or burning when urinating, or frequent urination) *BOWEL PROBLEMS (unusual diarrhea, constipation, pain near the anus) TENDERNESS IN MOUTH AND THROAT WITH OR WITHOUT PRESENCE OF ULCERS (sore throat, sores in mouth, or a toothache) UNUSUAL RASH, SWELLING OR PAIN  UNUSUAL VAGINAL DISCHARGE OR ITCHING   Items with * indicate a potential emergency and should be followed up as soon as possible or go to the Emergency Department if any problems should occur.  Please show the CHEMOTHERAPY ALERT CARD or IMMUNOTHERAPY ALERT CARD at  check-in to the Emergency Department and triage nurse.  Should you have questions after your visit or need to cancel or reschedule your appointment, please contact [place] [facility]  Dept: [phone]  and follow the prompts.  Office hours are 8:00 a.m. to 4:30 p.m. Monday - Friday. Please note that voicemails left after 4:00 p.m. may not be returned until the following business day.  We are closed weekends and major holidays. You have access to a nurse at all times for urgent questions. Please call the main number to the clinic Dept: [phone] and follow the prompts.   For any non-urgent questions, you may also contact your provider using MyChart. We now offer e-Visits for anyone 18 and older to request care online for non-urgent symptoms. For details visit [email or web address].   Also download the MyChart app! Go to the app store, search "MyChart", open the app, select , and log in with your MyChart username and password. 

## 2022-04-18 ENCOUNTER — Encounter: Payer: Self-pay | Admitting: Physician Assistant

## 2022-04-18 ENCOUNTER — Telehealth: Payer: Self-pay

## 2022-04-18 ENCOUNTER — Encounter: Payer: Self-pay | Admitting: Hematology and Oncology

## 2022-04-18 LAB — TSH: TSH: 1.279 u[IU]/mL (ref 0.350–4.500)

## 2022-04-18 NOTE — Progress Notes (Signed)
[place] [phone]   [phone]  PROGRESS NOTE  Patient Care Team: Allwardt, Randa Evens, PA-C as PCP - General (Physician Assistant) Milus Banister, MD as Attending Physician (Gastroenterology) Dr. Darylene Price, MD as Consulting Physician (Orthopedic Surgery) Latanya Maudlin, MD as Consulting Physician (Orthopedic Surgery) Kathrynn Ducking, MD (Inactive) as Consulting Physician (Neurology) Okey Regal, [place] as Consulting Physician (Optometry) Delice Lesch Lezlie Octave, MD as Consulting Physician (Neurology) Edythe Clarity, [facility] (Pharmacist)  Hematological/Oncological History # Small Cell Lung Cancer, Extensive Stage 07/05/2020: CT abdomen for lower abdominal pain. New pulmonary nodular density noted 07/06/2020: CT chest showed 2.2 cm macrolobulated right lower lobe pulmonary nodule (favored) versus pathologically enlarged infrahilar lymph node 10/13/2020: PET CT scan performed, findings show 2 cm right lower lobe lung mass is hypermetabolic and consistent with primary lung neoplasm. Additionally found hypermetabolic 17 mm soft tissue lesion between the descending duodenum and the pancreatic head  10/19/2020: EGD to biopsy hypermetabolic lymph node. Biopsy results show small cell lung cancer 10/26/2020: establish care with Dr. Lorenso Courier  11/13/2020: Cycle 1 Day 1 of Carbo/Etop/Atezolizumab 12/04/2020: Cycle 2 Day 1 of Carbo/Etop/Atezolizumab 11/22-11/25/2022: admitted for E. Coli bacteremia/sepsis. Start of Cycle 3 delayed. 01/02/2021: Cycle 3 Day 1 of Carbo/Etop/Atezolizumab 01/23/2021: Cycle 4 Day 1 of Carbo/Etop/Atezolizumab 02/22/2021: Cycle 5 Day 1 of Atezolizumab Maintenance. Delayed start due to patient's COVID infection.  03/14/2021: Cycle 6 Day 1 of Atezolizumab Maintenance 04/05/2021: Cycle 7 Day 1 of Atezolizumab Maintenance 04/25/2021: Cycle 8 Day 1 of Atezolizumab Maintenance 05/16/2021: Cycle 9 Day 1 of Atezolizumab Maintenance 06/06/2021:  Cycle 10 Day 1 of Atezolizumab Maintenance 06/27/2021:  Cycle 11 Day 1 of Atezolizumab Maintenance 07/18/2021: Cycle 12 Day 1 of Atezolizumab Maintenance 08/08/2021: Cycle 13 Day 1 of Atezolizumab Maintenance 08/29/2021: Cycle 14 Day 1 of Atezolizumab Maintenance 09/19/2021: Cycle 15 Day 1 of Atezolizumab Maintenance 10/10/2021: Cycle 16 Day 1 of Atezolizumab Maintenance 10/31/2021: Cycle 17 Day 1 of Atezolizumab Maintenance 11/21/2021: Cycle 18 Day 1 of Atezolizumab Maintenance 12/12/2021: Cycle 19 Day 1 of Atezolizumab Maintenance 01/02/2022: Cycle 20 Day 1 of Atezolizumab Maintenance 01/23/2022: Cycle 21 Day 1 of Atezolizumab Maintenance 02/13/2022: treatment HELD due to diarrhea.  03/06/2022: Cycle 22 Day 1 of Atezolizumab Maintenance 03/27/2022: Cycle 23 Day 1 of Atezolizumab Maintenance 04/17/2022: Cycle 24 Day 1 of Atezolizumab Maintenance  Interval History:  [name] 67 y.o. female with medical history significant for extensive stage small cell lung cancer who presents for a follow up visit. The patient's last visit was on 03/27/2022.  She presents today to start cycle 24 of chemotherapy.  On exam today [name] reports she that she is still struggling with diarrhea that has been present for the last 3 months. She is compliant with taking Lomotil three times a day with some improvement. She reports having approximately 3 episodes of diarrhea per day. She has persistent diffuse abdominal pain that has been present for the last 2 years. She rates the pain has 7 out of 10 on a pain scale. She takes Bentyl as prescribed with some relief of pain.She has nausea that improves with zofran. She has stable shortness of breath with exertion. She adds having a pruritic rash on her upper extremities and shoulders that is currently treated with steroid cream. She denies fevers, chills, sweats, chest pain or cough. She has no other complaints.A full 10 point ROS is listed below.  Overall she is willing  and able to proceed with atezolizumab maintenance at this time.  MEDICAL HISTORY:  Past Medical History:  Diagnosis Date   Allergic rhinitis    Anemia    Anxiety    Chicken pox    Chronic back pain    COPD (chronic obstructive pulmonary disease) (HCC)    Depression    DM (diabetes mellitus) ([place])    Essential hypertension    GERD (gastroesophageal reflux disease)    Headache    migraines   History of gastritis    EGD 2015   History of home oxygen therapy    2 liters at hs last 6 months   Hyperlipidemia    Hypothyroidism    Migraines    Osteoarthritis    oa   Scoliosis     SURGICAL HISTORY: Past Surgical History:  Procedure Laterality Date   APPENDECTOMY     1985   BIOPSY  07/24/2017   Procedure: BIOPSY;  Surgeon: Milus Banister, MD;  Location: [facility];  Service: Endoscopy;;   CARDIAC CATHETERIZATION N/A 10/31/2015   Procedure: Left Heart Cath and Coronary Angiography;  Surgeon: Leonie Man, MD;  Location: [place] [facility];  Service: Cardiovascular;  Laterality: N/A;   CARPAL TUNNEL RELEASE Left    CARPAL TUNNEL RELEASE Right    CHOLECYSTECTOMY  late 1980's   COLONOSCOPY WITH PROPOFOL N/A 07/24/2017   Procedure: COLONOSCOPY WITH PROPOFOL;  Surgeon: Milus Banister, MD;  [facility]: [facility];  Service: Endoscopy;  Laterality: N/A;   ESOPHAGOGASTRODUODENOSCOPY N/A 07/24/2017   Procedure: ESOPHAGOGASTRODUODENOSCOPY (EGD);  Surgeon: Milus Banister, MD;  Location: [facility];  Service: Endoscopy;  Laterality: N/A;   ESOPHAGOGASTRODUODENOSCOPY (EGD) WITH PROPOFOL N/A 10/19/2020   Procedure: ESOPHAGOGASTRODUODENOSCOPY (EGD) WITH PROPOFOL;  Surgeon: Milus Banister, MD;  [facility]: [facility];  Service: Endoscopy;  Laterality: N/A;   FINE NEEDLE ASPIRATION N/A 10/19/2020   Procedure: FINE NEEDLE ASPIRATION (FNA) LINEAR;  Surgeon: Milus Banister, MD;  [facility]: [facility];  Service: Endoscopy;  Laterality: N/A;   GALLBLADDER SURGERY  1991   HIP CLOSED  REDUCTION Right 01/08/2016   Procedure: CLOSED MANIPULATION HIP;  Surgeon: Susa Day, MD;  [facility]: [facility];  Service: Orthopedics;  Laterality: Right;   HIP CLOSED REDUCTION Right 01/19/2016   Procedure: ATTEMPTED CLOSED REDUCTION RIGHT HIP;  Surgeon: Wylene Simmer, MD;  Location: [facility];  Service: Orthopedics;  Laterality: Right;   HIP CLOSED REDUCTION Right 01/20/2016   Procedure: CLOSED REDUCTION RIGHT TOTAL HIP;  Surgeon: Paralee Cancel, MD;  [facility]: [facility];  Service: Orthopedics;  Laterality: Right;   HIP CLOSED REDUCTION Right 02/17/2016   Procedure: CLOSED REDUCTION RIGHT TOTAL HIP;  Surgeon: Rod Can, MD;  Location: [place];  Service: Orthopedics;  Laterality: Right;   HIP CLOSED REDUCTION Right 02/28/2016   Procedure: CLOSED REDUCTION HIP;  Surgeon: Nicholes Stairs, MD;  [facility];  Service: Orthopedics;  Laterality: Right;   IR IMAGING GUIDED PORT INSERTION  11/01/2020   POLYPECTOMY  07/24/2017   Procedure: POLYPECTOMY;  Surgeon: Milus Banister, MD;  Location: [facility];  Service: Endoscopy;;   [place], with 1 ovary removed and 2 nd ovary removed 2003   TOTAL HIP ARTHROPLASTY Right    Original surgery 2006 with revision 2010   TOTAL HIP REVISION Right 01/01/2016   Procedure: TOTAL HIP REVISION;  Surgeon: Paralee Cancel, MD;  Location: [facility];  Service: Orthopedics;  Laterality: Right;   TOTAL HIP REVISION Right 03/02/2016   Procedure: TOTAL HIP REVISION;  Surgeon: Paralee Cancel, MD;  Location[facility];  Service: Orthopedics;  Laterality: Right;   TOTAL HIP REVISION Right 09/02/2016   Procedure: Right hip constrained liner- posterior;  Surgeon: Paralee Cancel, MD;  Location: [facility];  Service: Orthopedics;  Laterality: Right;   ULNAR NERVE TRANSPOSITION Right    UPPER ESOPHAGEAL ENDOSCOPIC ULTRASOUND (EUS) N/A 10/19/2020   Procedure: UPPER ESOPHAGEAL ENDOSCOPIC ULTRASOUND (EUS);  Surgeon: Milus Banister, MD;  Location: [facility];  Service: Endoscopy;  Laterality: N/A;  periduodenal lesion    SOCIAL HISTORY: Social History   Socioeconomic History   Marital status: Married    Spouse name: Not on file   Number of children: 2   Years of education: Not on file   Highest education level: Not on file  Occupational History   Occupation: disabled   Occupation: disabled  Tobacco Use   Smoking status: Every Day    Packs/day: 2.00    Years: 46.00    Additional pack years: 0.00    Total pack years: 92.00    Types: Cigarettes   Smokeless tobacco: Never   Tobacco comments:    2 packs of cigarettes smoked daily 12/13/21- declines smoking cessation  Vaping Use   Vaping Use: Never used  Substance and Sexual Activity   Alcohol use: No   Drug use: No   Sexual activity: Not Currently    Partners: Male  Other Topics Concern   Not on file  Social History Narrative   Right handed    Caffeine~ 2 cups per day    Lives at home with husband (strained relationship)   Primary caretaker for disabled brother who had aneurism   Daughter died 2018-06-27    Social Determinants of Health   Financial Resource Strain: Low Risk  (12/13/2021)   Overall Financial Resource Strain (CARDIA)    Difficulty of Paying Living Expenses: Not very hard  Food Insecurity: No Food Insecurity (12/13/2021)   Hunger Vital Sign    Worried About Running Out of Food in the Last Year: Never true    [place] in the Last Year: Never true  Transportation Needs: No Transportation Needs (12/13/2021)   PRAPARE - Hydrologist (Medical): No    Lack of Transportation (Non-Medical): No  Physical Activity: Inactive (12/13/2021)   Exercise Vital Sign    Days of Exercise per Week: 0 days    Minutes of Exercise per Session: 0 min  Stress: Stress Concern Present (12/13/2021)   [place]    Feeling of Stress : To some extent  Social Connections:  Moderately Integrated (12/13/2021)   Social Connection and Isolation Panel [NHANES]    Frequency of Communication with Friends and Family: Twice a week    Frequency of Social Gatherings with Friends and Family: Once a week    Attends Religious Services: 1 to 4 times per year    Active Member of [employer] or Organizations: No    Attends Archivist Meetings: Never    Marital Status: Married  Human resources officer Violence: Not At Risk (05/03/2021)   Humiliation, Afraid, Rape, and Kick questionnaire    Fear of Current or Ex-Partner: No    Emotionally Abused: No    Physically Abused: No    Sexually Abused: No    FAMILY HISTORY: Family History  Problem Relation Age of Onset   COPD Mother    Heart disease Mother    Lung disease  Father        Asbestosis   Heart attack Father    Heart disease Father    Cerebral aneurysm Brother    Aneurysm Brother        Brain   Drug abuse Daughter    Epilepsy Son    Alcohol abuse Son    Drug abuse Son    Arthritis Maternal Grandmother    Heart disease Maternal Grandmother    Asthma Maternal Grandfather    Cancer Maternal Grandfather    Arthritis Paternal Grandmother    Heart disease Paternal Grandmother    Stroke Paternal Grandmother    Early death Paternal Grandfather    Heart disease Paternal Grandfather     ALLERGIES:  is allergic to metformin and related, nsaids, wellbutrin [bupropion], aleve [naproxen sodium], codeine, penicillins, and sulfonamide derivatives.  MEDICATIONS:  Current Outpatient Medications  Medication Sig Dispense Refill   albuterol (PROAIR HFA) 108 (90 Base) MCG/ACT inhaler 2 puffs every 4 hours as needed only  if your can't catch your breath 18 g 3   ALPRAZolam (XANAX) 1 MG tablet Take 1 tablet (1 mg total) by mouth 3 (three) times daily as needed. for anxiety 90 tablet 2   atorvastatin (LIPITOR) 20 MG tablet TAKE 1 TABLET BY MOUTH AT BEDTIME 90 tablet 1   benzonatate (TESSALON) 100 MG capsule Take 1 capsule (100 mg  total) by mouth 3 (three) times daily. 90 capsule 1   Budeson-Glycopyrrol-Formoterol (BREZTRI AEROSPHERE) 160-9-4.8 MCG/ACT AERO Inhale 2 puffs into the lungs in the morning and at bedtime. 10.7 g 3   cyclobenzaprine (FLEXERIL) 10 MG tablet Take 10 mg by mouth 3 (three) times daily as needed.     dicyclomine (BENTYL) 10 MG capsule Take 1 capsule (10 mg total) by mouth 2 (two) times daily. 60 capsule 1   diphenoxylate-atropine (LOMOTIL) 2.5-0.025 MG tablet Take 1 tablet by mouth 4 (four) times daily as needed for diarrhea or loose stools. 60 tablet 0   DULoxetine (CYMBALTA) 60 MG capsule Take 2 capsules (120 mg total) by mouth daily. 180 capsule 1   fluticasone (FLONASE) 50 MCG/ACT nasal spray INSTILL 2 SPRAYS IN EACH NOSTRIL EVERY DAY 16 g 6   furosemide (LASIX) 20 MG tablet TAKE 1 TABLET BY MOUTH TWICE DAILY, IF SWELLING IMPROVES CAN HOLD MEDICATION 90 tablet 0   guaiFENesin-codeine 100-10 MG/5ML syrup Take 5 mLs by mouth every 6 (six) hours as needed for cough. 120 mL 0   hydrOXYzine (ATARAX) 10 MG tablet Take 1 tablet (10 mg total) by mouth 3 (three) times daily as needed for itching. 30 tablet 0   levothyroxine (SYNTHROID) 137 MCG tablet Take 1 tablet (137 mcg total) by mouth daily before breakfast. 90 tablet 1   loratadine (CLARITIN) 10 MG tablet Take 10 mg by mouth daily.     meloxicam (MOBIC) 15 MG tablet TAKE 1 TABLET BY MOUTH EVERY DAY 30 tablet 2   OLANZapine (ZYPREXA) 10 MG tablet Take 1 tablet (10 mg total) by mouth at bedtime. 30 tablet 2   omeprazole (PRILOSEC) 40 MG capsule TAKE ONE CAPSULE BY MOUTH TWICE DAILY 180 capsule 3   ondansetron (ZOFRAN) 8 MG tablet TAKE 1 TABLET BY MOUTH TWICE DAILY 60 tablet 1   Oxycodone HCl 10 MG TABS Take 1 tablet (10 mg total) by mouth every 4 (four) hours as needed. Goal - to replace Norco- max 5 tabs/day 150 tablet 0   potassium chloride SA (KLOR-CON M) 20 MEQ tablet Take 1 tablet (20  mEq total) by mouth 2 (two) times daily. 60 tablet 2    prochlorperazine (COMPAZINE) 10 MG tablet Take 1 tablet (10 mg total) by mouth every 6 (six) hours as needed for nausea or vomiting. 30 tablet 0   triamcinolone ointment (KENALOG) 0.5 % Apply 1 Application topically 3 (three) times daily. Apply thin-layer on affected areas, no more than 2 weeks consistently. 60 g 2   triamterene-hydrochlorothiazide (MAXZIDE-25) 37.5-25 MG tablet TAKE 1 TABLET BY MOUTH DAILY 90 tablet 2   No current facility-administered medications for this visit.    REVIEW OF SYSTEMS:   Constitutional: ( - ) fevers, ( - )  chills , ( - ) night sweats Eyes: ( - ) blurriness of vision, ( - ) double vision, ( - ) watery eyes Ears, nose, mouth, throat, and face: ( - ) mucositis, ( - ) sore throat Respiratory: ( - ) cough, ( -) dyspnea, ( - ) wheezes Cardiovascular: ( - ) palpitation, ( - ) chest discomfort, ( - ) lower extremity swelling Gastrointestinal:  ( +) nausea, ( - ) heartburn, ( - ) change in bowel habits Skin: ( - ) abnormal skin rashes Lymphatics: ( - ) new lymphadenopathy, ( - ) easy bruising Neurological: (+ ) numbness, ( - ) tingling, ( - ) new weaknesses Behavioral/Psych: ( - ) mood change, ( - ) new changes  All other systems were reviewed with the patient and are negative.  PHYSICAL EXAMINATION: ECOG PERFORMANCE STATUS: 1 - Symptomatic but completely ambulatory  Vitals:   04/17/22 1429  BP: (!) 141/82  Pulse: 87  Resp: 17  Temp: 98.3 F (36.8 C)  SpO2: 93%    Filed Weights   04/17/22 1429  Weight: 202 lb (91.6 kg)     GENERAL: Well-appearing middle-age Caucasian female, alert, no distress and comfortable SKIN: skin color, texture, turgor are normal, no rashes or significant lesions EYES: conjunctiva are pink and non-injected, sclera clear LUNGS:  normal breathing effort. Diffuse wheezing hear on ausculation.  HEART: regular rate & rhythm and no murmurs. Mild bilateral lower extremity edema Musculoskeletal: no cyanosis of digits and no  clubbing  PSYCH: alert & oriented x 3, fluent speech NEURO: no focal motor/sensory deficits  LABORATORY DATA:  I have reviewed the data as listed    Latest Ref Rng & Units 04/17/2022    1:57 PM 03/27/2022    9:28 AM 03/06/2022    7:54 AM  CBC  WBC 4.0 - 10.5 K/uL 8.0  9.6  9.3   Hemoglobin 12.0 - 15.0 g/dL 11.5  12.6  12.5   Hematocrit 36.0 - 46.0 % 36.2  38.6  39.1   Platelets 150 - 400 K/uL 161  200  184        Latest Ref Rng & Units 04/17/2022    1:57 PM 03/27/2022    9:28 AM 03/06/2022    7:54 AM  CMP  Glucose 70 - 99 mg/dL 89  97  88   BUN 8 - 23 mg/dL 7  8  11    Creatinine 0.44 - 1.00 mg/dL 0.85  0.94  0.93   Sodium 135 - 145 mmol/L 133  134  134   Potassium 3.5 - 5.1 mmol/L 3.7  3.9  3.8   Chloride 98 - 111 mmol/L 100  98  98   CO2 22 - 32 mmol/L 27  29  29    Calcium 8.9 - 10.3 mg/dL 8.1  8.4  8.5   Total Protein 6.5 -  8.1 g/dL 5.9  6.2  6.1   Total Bilirubin 0.3 - 1.2 mg/dL 0.5  0.3  0.3   Alkaline Phos 38 - 126 U/L 59  72  69   AST 15 - 41 U/L 13  10  11    ALT 0 - 44 U/L 10  7  7      No results found for: "MPROTEIN" Lab Results  Component Value Date   KPAFRELGTCHN 0.75 06/30/2014   LAMBDASER 3.78 (H) 06/30/2014   KAPLAMBRATIO 0.20 (L) 06/30/2014     RADIOGRAPHIC STUDIES: No results found.  ASSESSMENT & PLAN [name] 67 y.o. female with medical history significant for extensive stage small cell lung cancer who presents for a follow up visit.   After review of the labs, review of the records, and discussion with the patient the patients findings are most consistent with extensive stage small cell lung cancer with metastasis from the right lower lobe to the lymph nodes of the abdomen.  At this time we will pursue triple therapy with carboplatin, etoposide, and atezolizumab.  After 4 cycles we will convert to maintenance atezolizumab alone.  We previously discussed the risks and benefits of this therapy and the patient was in agreement to proceed with this  treatment.  The treatment of choice consist of carboplatin, etoposide, and atezolizumab.  The regimen consists of carboplatin AUC of 5 IV on day 1, etoposide 100 mg per metered squared IV on day 1, 2, and 3 and atezolizumab 1200 mg on day 1.  This continues for 21-day cycles.  After 4 cycles the patient proceeds with atezolizumab maintenance therapy alone.    # Small Cell Lung Cancer, Extensive Stage -- MRI of the brain shows no evidence of intracranial spread --Findings are currently consistent with metastatic small cell lung cancer with metastatic spread to the lymph nodes of the abdomen --Prognosis is poor with anticipated life span of less than 12 months -- Patient has extensive stage due to metastatic spread to lymph nodes in the abdomen --Plan to proceed with carboplatin, etoposide, and atezolizumab --will consider prophylactic cranial radiation after start of chemotherapy.  Plan: --today is Cycle 24 Day 1 of Atezolizumab maintenance  --labs today were reviewed and adequate for treatment. Hgb 11.5, white blood cell 8.0, MCV 84.6, and platelets of 161 --:Proceed with treatment today without any dose modifications.  --Scheduled for repeat CT CAP on 04/26/2022 --RTC in 3 weeks for next cycle of maintenance atezolizumab    #Diarrhea- stable --Patient reports having 3 episodes per day. --stool sent for C. Diff and GI pathology panel, all negative --patient following with GI --encouraged to continue with lomotil and add imodium for better symptom control.   #Nausea: --Symptoms improved with zofran and olanzapine.  --Added IV zofran to infusion premeds for better symptom control.   #Hypokalemia:  --currently taking potassium chloride 20 meq BID --potassium level is still 3.7 today. Continue PO supplementation.   #Lower extremity edema-worsening --Currently takes lasix 20 mg daily, recommending increasing to q 6 hours x 2 doses daily.  --Stable. Continue to monitor.   #Neuropathy  involving fingers/feet: --Patient currently takes Cymbalta   #Supportive Care -- chemotherapy education complete -- port placed -- zofran 8mg  q8H PRN and compazine 10mg  PO q6H for nausea -- EMLA cream for port -- Will add mirtazapine in order to help with appetite/sleep  No orders of the defined types were placed in this encounter.  All questions were answered. The patient knows to call the clinic with  any problems, questions or concerns.  I have spent a total of 30 minutes minutes of face-to-face and non-face-to-face time, preparing to see the patient, performing a medically appropriate examination, counseling and educating the patient, ordering medications/tests, documenting clinical information in the electronic health record, and care coordination.   Dede Query PA-C Dept of Hematology and [place] [facility] Phone: [phone]   04/18/2022 6:11 AM

## 2022-04-18 NOTE — Telephone Encounter (Signed)
please notify patient that Dr. Lorenso Courier was fine with patient to take imodium along with lomotil for her diarrhea   Pt advised with VU

## 2022-04-19 ENCOUNTER — Other Ambulatory Visit: Payer: Self-pay | Admitting: Hematology and Oncology

## 2022-04-19 ENCOUNTER — Other Ambulatory Visit: Payer: Self-pay | Admitting: Nurse Practitioner

## 2022-04-19 ENCOUNTER — Other Ambulatory Visit: Payer: Self-pay | Admitting: Physician Assistant

## 2022-04-19 LAB — T4: T4, Total: 8.7 ug/dL (ref 4.5–12.0)

## 2022-04-19 MED ORDER — PERMETHRIN 5 % EX CREA: 1.0000 | TOPICAL_CREAM | Freq: Once | CUTANEOUS | 1 refills | Status: AC

## 2022-04-19 NOTE — Telephone Encounter (Signed)
Please see pt msg and advise 

## 2022-04-22 LAB — PANCREATIC ELASTASE, FECAL: Pancreatic Elastase-1, Stool: 79 mcg/g — ABNORMAL LOW

## 2022-04-22 NOTE — Telephone Encounter (Signed)
Please see pt response.

## 2022-04-23 ENCOUNTER — Encounter: Payer: Self-pay | Admitting: Physician Assistant

## 2022-04-24 ENCOUNTER — Telehealth: Payer: Self-pay | Admitting: Nurse Practitioner

## 2022-04-24 ENCOUNTER — Telehealth: Payer: Self-pay | Admitting: Hematology and Oncology

## 2022-04-24 ENCOUNTER — Encounter: Payer: Self-pay | Admitting: Physician Assistant

## 2022-04-24 NOTE — Telephone Encounter (Signed)
Patient called wanting go over recent lab results, please advise.

## 2022-04-24 NOTE — Telephone Encounter (Signed)
Reached out to patient to schedule per WQ patient wants to get these under her belt before she schedules in May.

## 2022-04-24 NOTE — Telephone Encounter (Signed)
Please see pt response and advise 

## 2022-04-25 ENCOUNTER — Other Ambulatory Visit: Payer: Self-pay

## 2022-04-25 DIAGNOSIS — K8689 Other specified diseases of pancreas: Secondary | ICD-10-CM

## 2022-04-25 MED ORDER — PANCRELIPASE (LIP-PROT-AMYL) 36000-114000 UNITS PO CPEP: 72000.0000 [IU] | ORAL_CAPSULE | Freq: Three times a day (TID) | ORAL | 1 refills | Status: DC

## 2022-04-25 NOTE — Telephone Encounter (Signed)
Pt calling requesting lab results from Pancreatic Elastase, Fecal done on 04/15/2022. Pt was notified that as soon as the provider reviews the results and makes recommendations then we will make her aware. Pt verbalized understanding with all questions answered.

## 2022-04-26 ENCOUNTER — Ambulatory Visit (HOSPITAL_COMMUNITY): Admission: RE | Admit: 2022-04-26 | Payer: 59 | Source: Ambulatory Visit

## 2022-04-26 ENCOUNTER — Ambulatory Visit (HOSPITAL_COMMUNITY)
Admission: RE | Admit: 2022-04-26 | Discharge: 2022-04-26 | Disposition: A | Discharge status: Home / Self Care | Payer: 59 | Source: Ambulatory Visit | Attending: Hematology and Oncology | Admitting: Hematology and Oncology

## 2022-04-26 DIAGNOSIS — J439 Emphysema, unspecified: Secondary | ICD-10-CM | POA: Diagnosis not present

## 2022-04-26 DIAGNOSIS — C3490 Malignant neoplasm of unspecified part of unspecified bronchus or lung: Secondary | ICD-10-CM | POA: Insufficient documentation

## 2022-04-26 DIAGNOSIS — N3289 Other specified disorders of bladder: Secondary | ICD-10-CM | POA: Diagnosis not present

## 2022-04-26 MED ORDER — IOHEXOL 300 MG/ML  SOLN
100.0000 mL | Freq: Once | INTRAMUSCULAR | Status: AC | PRN
  Administered 2022-04-26: 100 mL via INTRAVENOUS

## 2022-04-28 DIAGNOSIS — I10 Essential (primary) hypertension: Secondary | ICD-10-CM

## 2022-04-28 DIAGNOSIS — J449 Chronic obstructive pulmonary disease, unspecified: Secondary | ICD-10-CM

## 2022-04-28 DIAGNOSIS — F17210 Nicotine dependence, cigarettes, uncomplicated: Secondary | ICD-10-CM

## 2022-04-28 NOTE — Telephone Encounter (Signed)
See lab note.  

## 2022-04-29 ENCOUNTER — Encounter: Payer: Self-pay | Admitting: Physician Assistant

## 2022-04-29 ENCOUNTER — Telehealth: Payer: Self-pay | Admitting: *Deleted

## 2022-04-29 NOTE — Telephone Encounter (Signed)
Please see pt msg and advise 

## 2022-04-29 NOTE — Telephone Encounter (Addendum)
-----   Message from Orson Slick, MD sent at 04/29/2022  8:41 AM EDT ----- Please let [name] know that her CT scan showed no evidence of recurrent small cell lung cancer.  Her disease remains under excellent control.  There was a small spot noted on her kidney which may need further evaluation.  I do not think it is related to her lung cancer.  We will talk about it further when we see her in clinic on 05/08/2022.  Contacted patient with results as per Dr. Libby Maw note above. Patient verbalized understanding and states she appreciated call.  [name] asked that Dr. Lorenso Courier receive following message: GI doc is treating her on going diarrhea and has prescribed Creon following recent lab test for pancreatic elastase level which was low. She said they told her she had pancreatic insufficiency.  PCP is treating rash on shoulders.

## 2022-04-30 NOTE — Telephone Encounter (Signed)
Ok to place urgent referral to Dermatology for patient

## 2022-05-01 ENCOUNTER — Other Ambulatory Visit: Payer: Self-pay

## 2022-05-01 DIAGNOSIS — R21 Rash and other nonspecific skin eruption: Secondary | ICD-10-CM

## 2022-05-02 ENCOUNTER — Ambulatory Visit (INDEPENDENT_AMBULATORY_CARE_PROVIDER_SITE_OTHER): Payer: 59 | Admitting: Dermatology

## 2022-05-02 ENCOUNTER — Encounter: Payer: Self-pay | Admitting: Dermatology

## 2022-05-02 DIAGNOSIS — D1801 Hemangioma of skin and subcutaneous tissue: Secondary | ICD-10-CM

## 2022-05-02 DIAGNOSIS — L299 Pruritus, unspecified: Secondary | ICD-10-CM | POA: Diagnosis not present

## 2022-05-02 MED ORDER — PERMETHRIN 5 % EX CREA: 1.0000 | TOPICAL_CREAM | Freq: Once | CUTANEOUS | 0 refills | Status: AC

## 2022-05-02 MED ORDER — TACROLIMUS 0.1 % EX OINT: TOPICAL_OINTMENT | Freq: Two times a day (BID) | CUTANEOUS | 0 refills | Status: DC

## 2022-05-02 NOTE — Patient Instructions (Addendum)
Treatment Plan: -Permethrin prescription was sent due to incomplete treatment of scabies from PCP -Triamcinolone cream twice a day for up to two weeks alternating with tacrolimus. You have the Triamcinolone at home from previous prescription -Tacrolimus twice a day for up to two weeks alternating with Triamcinolone -Use Dove Sensitive soap in the shower -Cereve anti itch moisturizer   Due to recent changes in healthcare laws, you may see results of your pathology and/or laboratory studies on MyChart before the doctors have had a chance to review them. We understand that in some cases there may be results that are confusing or concerning to you. Please understand that not all results are received at the same time and often the doctors may need to interpret multiple results in order to provide you with the best plan of care or course of treatment. Therefore, we ask that you please give [place] 2 business days to thoroughly review all your results before contacting the office for clarification. Should we see a critical lab result, you will be contacted sooner.   If You Need Anything After Your Visit  If you have any questions or concerns for your doctor, please call our main line at [phone] If no one answers, please leave a voicemail as directed and we will return your call as soon as possible. Messages left after 4 pm will be answered the following business day.   You may also send [place] a message via [place]. We typically respond to MyChart messages within 1-2 business days.  For prescription refills, please ask your pharmacy to contact our office. Our fax number is [phone].  If you have an urgent issue when the clinic is closed that cannot wait until the next business day, you can page your doctor at the number below.    Please note that while we do our best to be available for urgent issues outside of office hours, we are not available 24/7.   If you have an urgent issue and are unable to  reach [place], you may choose to seek medical care at your doctor's office, retail clinic, urgent care center, or emergency room.  If you have a medical emergency, please immediately call 911 or go to the emergency department. In the event of inclement weather, please call our main line at [phone] for an update on the status of any delays or closures.  Dermatology Medication Tips: Please keep the boxes that topical medications come in in order to help keep track of the instructions about where and how to use these. Pharmacies typically print the medication instructions only on the boxes and not directly on the medication tubes.   If your medication is too expensive, please contact our office at [phone] or send [place] a message through [place].   We are unable to tell what your co-pay for medications will be in advance as this is different depending on your insurance coverage. However, we may be able to find a substitute medication at lower cost or fill out paperwork to get insurance to cover a needed medication.   If a prior authorization is required to get your medication covered by your insurance company, please allow [place] 1-2 business days to complete this process.  Drug prices often vary depending on where the prescription is filled and some pharmacies may offer cheaper prices.  The website www.goodrx.com contains coupons for medications through different pharmacies. The prices here do not account for what the cost may be with help from insurance (it may be cheaper  with your insurance), but the website can give you the price if you did not use any insurance.  - You can print the associated coupon and take it with your prescription to the pharmacy.  - You may also stop by our office during regular business hours and pick up a GoodRx coupon card.  - If you need your prescription sent electronically to a different pharmacy, notify our office through [facility] or by phone at  [phone]

## 2022-05-02 NOTE — Progress Notes (Signed)
   New Patient Visit   Subjective  [name] is a 67 y.o. female who presents for the following: Rash  Patient complains of a rash that has been present for about a month. PCP gave Triamcinolone and Permethrin but did help the itch momentarily but comes back and rash is still spreading. PCP ruled out shingles. Rash is localized to shoulders, back, back of right ear. Patient was diagnosed with cancer in September of 2022 and started treatment of October of 2022. Started immunotherapy in January of 2023.   Pt states that the itch is a 10/10 baseline and will decrease to a 1/10 immediately after applying the triamcinolone but then it "wears off" and the itching returns. She currently baths with Dial soap, denies any recent travel that could have been a point of contact for scabies.  The following portions of the chart were reviewed this encounter and updated as appropriate: medications, allergies, medical history  Review of Systems:  No other skin or systemic complaints except as noted in HPI or Assessment and Plan.  Objective  Well appearing patient in no apparent distress; mood and affect are within normal limits.  A focused examination was performed of the following areas: Back, shoulders, and back of right ear  Relevant exam findings are noted in the Assessment and Plan.    Assessment & Plan     HEMANGIOMA Exam: red papule(s) on right upper lip Discussed benign nature. Recommend observation. Call for changes.   Treatment: electrodesiccation    PRURITUS Exam:  pt exhibits active itching and numerous linear excoriations on PE    Treatment Plan: No active rash today and itching is not in areas sugestive of  Scabies and pt has no travel or point sof contact fpr scabies transmission.  With that said, not having seen the original "rash"  I explained the importance of completing the 1 week follow up of the topical permethrin cream, just incase. -The chronic itching is most  likely a cumulative effect from her Immunotherapy and made worse by using harsh soaps and hot water  -Permethrin prescription was sent due to incomplete treatment of scabies from PCP -Triamcinolone cream twice a day for up to two weeks alternating with tacrolimus. Patient has Triamcinolone at home from previous prescription -Tacrolimus twice a day for up to two weeks alternating with Triamcinolone -STOP using Dial soal, Instead Use Dove Sensitive soap in the shower --> NO HOT WATER -Cereve anti itch moisturizer every day and is ok to reapply throughout the day   I examined the patient in the affected areas and counseled on affects of long term use of steroid creams.   No follow-ups on file.    Documentation: I have reviewed the above documentation for accuracy and completeness, and I agree with the above.  Ellard Artis, MD  I, Zigmund Gottron, CMA, am acting as scribe for Ellard Artis, MD.

## 2022-05-03 ENCOUNTER — Encounter: Payer: 59 | Admitting: Physical Medicine and Rehabilitation

## 2022-05-03 ENCOUNTER — Telehealth: Payer: Self-pay | Admitting: Physical Medicine and Rehabilitation

## 2022-05-03 MED ORDER — OXYCODONE HCL 10 MG PO TABS: 10.0000 mg | ORAL_TABLET | ORAL | 0 refills | Status: DC | PRN

## 2022-05-03 NOTE — Addendum Note (Signed)
Addended by: Genice Rouge on: 05/03/2022 11:44 AM   Modules accepted: Orders

## 2022-05-03 NOTE — Telephone Encounter (Signed)
Patient called requesting to reschedule her appointment , she woke up not feeling well. Patient states she needs refill on oxycodone and uses [facility]

## 2022-05-08 ENCOUNTER — Inpatient Hospital Stay: Payer: 59

## 2022-05-08 ENCOUNTER — Inpatient Hospital Stay: Payer: 59 | Attending: Hematology and Oncology | Admitting: Physician Assistant

## 2022-05-08 VITALS — BP 141/75 | HR 90 | Temp 98.2°F | Resp 18 | Ht 62.5 in | Wt 197.2 lb

## 2022-05-08 DIAGNOSIS — G629 Polyneuropathy, unspecified: Secondary | ICD-10-CM | POA: Insufficient documentation

## 2022-05-08 DIAGNOSIS — Z811 Family history of alcohol abuse and dependence: Secondary | ICD-10-CM | POA: Insufficient documentation

## 2022-05-08 DIAGNOSIS — R600 Localized edema: Secondary | ICD-10-CM | POA: Insufficient documentation

## 2022-05-08 DIAGNOSIS — M549 Dorsalgia, unspecified: Secondary | ICD-10-CM | POA: Diagnosis not present

## 2022-05-08 DIAGNOSIS — J449 Chronic obstructive pulmonary disease, unspecified: Secondary | ICD-10-CM | POA: Insufficient documentation

## 2022-05-08 DIAGNOSIS — Z880 Allergy status to penicillin: Secondary | ICD-10-CM | POA: Diagnosis not present

## 2022-05-08 DIAGNOSIS — Z79890 Hormone replacement therapy (postmenopausal): Secondary | ICD-10-CM | POA: Diagnosis not present

## 2022-05-08 DIAGNOSIS — Z823 Family history of stroke: Secondary | ICD-10-CM | POA: Insufficient documentation

## 2022-05-08 DIAGNOSIS — C772 Secondary and unspecified malignant neoplasm of intra-abdominal lymph nodes: Secondary | ICD-10-CM | POA: Diagnosis not present

## 2022-05-08 DIAGNOSIS — Z9049 Acquired absence of other specified parts of digestive tract: Secondary | ICD-10-CM | POA: Insufficient documentation

## 2022-05-08 DIAGNOSIS — Z825 Family history of asthma and other chronic lower respiratory diseases: Secondary | ICD-10-CM | POA: Insufficient documentation

## 2022-05-08 DIAGNOSIS — G8929 Other chronic pain: Secondary | ICD-10-CM | POA: Diagnosis not present

## 2022-05-08 DIAGNOSIS — C3431 Malignant neoplasm of lower lobe, right bronchus or lung: Secondary | ICD-10-CM | POA: Insufficient documentation

## 2022-05-08 DIAGNOSIS — R197 Diarrhea, unspecified: Secondary | ICD-10-CM | POA: Insufficient documentation

## 2022-05-08 DIAGNOSIS — R110 Nausea: Secondary | ICD-10-CM | POA: Diagnosis not present

## 2022-05-08 DIAGNOSIS — Z886 Allergy status to analgesic agent status: Secondary | ICD-10-CM | POA: Insufficient documentation

## 2022-05-08 DIAGNOSIS — C3490 Malignant neoplasm of unspecified part of unspecified bronchus or lung: Secondary | ICD-10-CM | POA: Diagnosis not present

## 2022-05-08 DIAGNOSIS — E119 Type 2 diabetes mellitus without complications: Secondary | ICD-10-CM | POA: Diagnosis not present

## 2022-05-08 DIAGNOSIS — F17210 Nicotine dependence, cigarettes, uncomplicated: Secondary | ICD-10-CM | POA: Diagnosis not present

## 2022-05-08 DIAGNOSIS — I10 Essential (primary) hypertension: Secondary | ICD-10-CM | POA: Insufficient documentation

## 2022-05-08 DIAGNOSIS — Z5112 Encounter for antineoplastic immunotherapy: Secondary | ICD-10-CM | POA: Insufficient documentation

## 2022-05-08 DIAGNOSIS — Z8719 Personal history of other diseases of the digestive system: Secondary | ICD-10-CM | POA: Insufficient documentation

## 2022-05-08 DIAGNOSIS — R630 Anorexia: Secondary | ICD-10-CM | POA: Insufficient documentation

## 2022-05-08 DIAGNOSIS — Z882 Allergy status to sulfonamides status: Secondary | ICD-10-CM | POA: Insufficient documentation

## 2022-05-08 DIAGNOSIS — E876 Hypokalemia: Secondary | ICD-10-CM | POA: Insufficient documentation

## 2022-05-08 DIAGNOSIS — F32A Depression, unspecified: Secondary | ICD-10-CM | POA: Insufficient documentation

## 2022-05-08 DIAGNOSIS — Z79621 Long term (current) use of calcineurin inhibitor: Secondary | ICD-10-CM | POA: Insufficient documentation

## 2022-05-08 DIAGNOSIS — Z90710 Acquired absence of both cervix and uterus: Secondary | ICD-10-CM | POA: Insufficient documentation

## 2022-05-08 DIAGNOSIS — K219 Gastro-esophageal reflux disease without esophagitis: Secondary | ICD-10-CM | POA: Diagnosis not present

## 2022-05-08 DIAGNOSIS — E785 Hyperlipidemia, unspecified: Secondary | ICD-10-CM | POA: Diagnosis not present

## 2022-05-08 DIAGNOSIS — N289 Disorder of kidney and ureter, unspecified: Secondary | ICD-10-CM

## 2022-05-08 DIAGNOSIS — F419 Anxiety disorder, unspecified: Secondary | ICD-10-CM | POA: Diagnosis not present

## 2022-05-08 DIAGNOSIS — Z8249 Family history of ischemic heart disease and other diseases of the circulatory system: Secondary | ICD-10-CM | POA: Insufficient documentation

## 2022-05-08 DIAGNOSIS — Z95828 Presence of other vascular implants and grafts: Secondary | ICD-10-CM

## 2022-05-08 DIAGNOSIS — Z79899 Other long term (current) drug therapy: Secondary | ICD-10-CM | POA: Insufficient documentation

## 2022-05-08 DIAGNOSIS — Z888 Allergy status to other drugs, medicaments and biological substances status: Secondary | ICD-10-CM | POA: Insufficient documentation

## 2022-05-08 DIAGNOSIS — Z8261 Family history of arthritis: Secondary | ICD-10-CM | POA: Insufficient documentation

## 2022-05-08 DIAGNOSIS — Z814 Family history of other substance abuse and dependence: Secondary | ICD-10-CM | POA: Insufficient documentation

## 2022-05-08 DIAGNOSIS — Z885 Allergy status to narcotic agent status: Secondary | ICD-10-CM | POA: Insufficient documentation

## 2022-05-08 DIAGNOSIS — Z90721 Acquired absence of ovaries, unilateral: Secondary | ICD-10-CM | POA: Insufficient documentation

## 2022-05-08 DIAGNOSIS — Z820 Family history of epilepsy and other diseases of the nervous system: Secondary | ICD-10-CM | POA: Insufficient documentation

## 2022-05-08 DIAGNOSIS — Z809 Family history of malignant neoplasm, unspecified: Secondary | ICD-10-CM | POA: Insufficient documentation

## 2022-05-08 LAB — CBC WITH DIFFERENTIAL (CANCER CENTER ONLY)
Abs Immature Granulocytes: 0.02 10*3/uL (ref 0.00–0.07)
Basophils Absolute: 0.1 10*3/uL (ref 0.0–0.1)
Basophils Relative: 1 %
Eosinophils Absolute: 1 10*3/uL — ABNORMAL HIGH (ref 0.0–0.5)
Eosinophils Relative: 11 %
HCT: 37 % (ref 36.0–46.0)
Hemoglobin: 12 g/dL (ref 12.0–15.0)
Immature Granulocytes: 0 %
Lymphocytes Relative: 34 %
Lymphs Abs: 2.9 10*3/uL (ref 0.7–4.0)
MCH: 27.1 pg (ref 26.0–34.0)
MCHC: 32.4 g/dL (ref 30.0–36.0)
MCV: 83.5 fL (ref 80.0–100.0)
Monocytes Absolute: 0.8 10*3/uL (ref 0.1–1.0)
Monocytes Relative: 9 %
Neutro Abs: 3.8 10*3/uL (ref 1.7–7.7)
Neutrophils Relative %: 45 %
Platelet Count: 169 10*3/uL (ref 150–400)
RBC: 4.43 MIL/uL (ref 3.87–5.11)
RDW: 18 % — ABNORMAL HIGH (ref 11.5–15.5)
WBC Count: 8.5 10*3/uL (ref 4.0–10.5)
nRBC: 0 % (ref 0.0–0.2)

## 2022-05-08 LAB — CMP (CANCER CENTER ONLY)
ALT: 7 U/L (ref 0–44)
AST: 13 U/L — ABNORMAL LOW (ref 15–41)
Albumin: 3.6 g/dL (ref 3.5–5.0)
Alkaline Phosphatase: 64 U/L (ref 38–126)
Anion gap: 7 (ref 5–15)
BUN: 9 mg/dL (ref 8–23)
CO2: 30 mmol/L (ref 22–32)
Calcium: 8.7 mg/dL — ABNORMAL LOW (ref 8.9–10.3)
Chloride: 98 mmol/L (ref 98–111)
Creatinine: 1.06 mg/dL — ABNORMAL HIGH (ref 0.44–1.00)
GFR, Estimated: 58 mL/min — ABNORMAL LOW (ref >60)
Glucose, Bld: 92 mg/dL (ref 70–99)
Potassium: 3.8 mmol/L (ref 3.5–5.1)
Sodium: 135 mmol/L (ref 135–145)
Total Bilirubin: 0.3 mg/dL (ref 0.3–1.2)
Total Protein: 6.4 g/dL — ABNORMAL LOW (ref 6.5–8.1)

## 2022-05-08 LAB — TSH: TSH: 0.947 u[IU]/mL (ref 0.350–4.500)

## 2022-05-08 MED ORDER — SODIUM CHLORIDE 0.9% FLUSH
10.0000 mL | Freq: Once | INTRAVENOUS | Status: AC
  Administered 2022-05-08: 10 mL

## 2022-05-08 MED ORDER — HEPARIN SOD (PORK) LOCK FLUSH 100 UNIT/ML IV SOLN
500.0000 [IU] | Freq: Once | INTRAVENOUS | Status: AC
  Administered 2022-05-08: 500 [IU] via INTRAVENOUS

## 2022-05-08 MED ORDER — SODIUM CHLORIDE 0.9% FLUSH
10.0000 mL | INTRAVENOUS | Status: DC | PRN
  Administered 2022-05-08: 10 mL via INTRAVENOUS

## 2022-05-08 NOTE — Progress Notes (Signed)
[facility] [phone]   [phone]  PROGRESS NOTE  Patient Care Team: Allwardt, Crist Infante, PA-C as PCP - General (Physician Assistant) Rachael Fee, MD as Attending Physician (Gastroenterology) Dr. Louie Bun, MD as Consulting Physician (Orthopedic Surgery) Ranee Gosselin, MD as Consulting Physician (Orthopedic Surgery) York Spaniel, MD (Inactive) as Consulting Physician (Neurology) Smitty Cords, OD as Consulting Physician (Optometry) Karel Jarvis Lesle Chris, MD as Consulting Physician (Neurology) Erroll Luna, [facility] (Pharmacist)  Hematological/Oncological History # Small Cell Lung Cancer, Extensive Stage 07/05/2020: CT abdomen for lower abdominal pain. New pulmonary nodular density noted 07/06/2020: CT chest showed 2.2 cm macrolobulated right lower lobe pulmonary nodule (favored) versus pathologically enlarged infrahilar lymph node 10/13/2020: PET CT scan performed, findings show 2 cm right lower lobe lung mass is hypermetabolic and consistent with primary lung neoplasm. Additionally found hypermetabolic 17 mm soft tissue lesion between the descending duodenum and the pancreatic head  10/19/2020: EGD to biopsy hypermetabolic lymph node. Biopsy results show small cell lung cancer 10/26/2020: establish care with Dr. Leonides Schanz  11/13/2020: Cycle 1 Day 1 of Carbo/Etop/Atezolizumab 12/04/2020: Cycle 2 Day 1 of Carbo/Etop/Atezolizumab 11/22-11/25/2022: admitted for E. Coli bacteremia/sepsis. Start of Cycle 3 delayed. 01/02/2021: Cycle 3 Day 1 of Carbo/Etop/Atezolizumab 01/23/2021: Cycle 4 Day 1 of Carbo/Etop/Atezolizumab 02/22/2021: Cycle 5 Day 1 of Atezolizumab Maintenance. Delayed start due to patient's COVID infection.  03/14/2021: Cycle 6 Day 1 of Atezolizumab Maintenance 04/05/2021: Cycle 7 Day 1 of Atezolizumab Maintenance 04/25/2021: Cycle 8 Day 1 of Atezolizumab Maintenance 05/16/2021: Cycle 9 Day 1 of Atezolizumab Maintenance 06/06/2021:  Cycle 10 Day 1 of Atezolizumab Maintenance 06/27/2021:  Cycle 11 Day 1 of Atezolizumab Maintenance 07/18/2021: Cycle 12 Day 1 of Atezolizumab Maintenance 08/08/2021: Cycle 13 Day 1 of Atezolizumab Maintenance 08/29/2021: Cycle 14 Day 1 of Atezolizumab Maintenance 09/19/2021: Cycle 15 Day 1 of Atezolizumab Maintenance 10/10/2021: Cycle 16 Day 1 of Atezolizumab Maintenance 10/31/2021: Cycle 17 Day 1 of Atezolizumab Maintenance 11/21/2021: Cycle 18 Day 1 of Atezolizumab Maintenance 12/12/2021: Cycle 19 Day 1 of Atezolizumab Maintenance 01/02/2022: Cycle 20 Day 1 of Atezolizumab Maintenance 01/23/2022: Cycle 21 Day 1 of Atezolizumab Maintenance 02/13/2022: treatment HELD due to diarrhea.  03/06/2022: Cycle 22 Day 1 of Atezolizumab Maintenance 03/27/2022: Cycle 23 Day 1 of Atezolizumab Maintenance 04/17/2022: Cycle 24 Day 1 of Atezolizumab Maintenance 05/08/2022:  Cycle 25 Day 1 of Atezolizumab Maintenance (Held due to patient preference for treatment holiday)  Interval History:  [name] 67 y.o. female with medical history significant for extensive stage small cell lung cancer who presents for a follow up visit. The patient's last visit was on 04/17/2022.  She presents today to start cycle 25 of chemotherapy.  On exam today [name] reports she is having worsening fatigue which interferes with her ADLs. She requires frequent resting throughout the day. She has a poor appetite but weight is stable. She does have daily episodes of nausea that is well controled with zofran. She currently only takes zofran once a day. She denies vomiting episodes. She denies any abdominal pain. Her diarrhea has improved after starting creon. She adds that her stools are more formed. She was recently evaluated by dermatologist for her chronic itching and was recently prescribed topical triamcinolone cream and tacrolimus. Additionally she was encouraged to change soaps and stop hot shower. She denies fevers, chills, sweats,  chest pain or cough. She has no other complaints.A full 10 point ROS is listed below.   MEDICAL HISTORY:  Past Medical History:  Diagnosis Date   Allergic rhinitis    Anemia    Anxiety    Chicken pox    Chronic back pain    COPD (chronic obstructive pulmonary disease)    Depression    DM (diabetes mellitus)    Essential hypertension    GERD (gastroesophageal reflux disease)    Headache    migraines   History of gastritis    EGD 2015   History of home oxygen therapy    2 liters at hs last 6 months   Hyperlipidemia    Hypothyroidism    Migraines    Osteoarthritis    oa   Scoliosis     SURGICAL HISTORY: Past Surgical History:  Procedure Laterality Date   APPENDECTOMY     1985   BIOPSY  07/24/2017   Procedure: BIOPSY;  Surgeon: Rachael Fee, MD;  Location: [facility];  Service: Endoscopy;;   CARDIAC CATHETERIZATION N/A 10/31/2015   Procedure: Left Heart Cath and Coronary Angiography;  Surgeon: Marykay Lex, MD;  Location: [facility] INVASIVE CV LAB;  Service: Cardiovascular;  Laterality: N/A;   CARPAL TUNNEL RELEASE Left    CARPAL TUNNEL RELEASE Right    CHOLECYSTECTOMY  late 1980's   COLONOSCOPY WITH PROPOFOL N/A 07/24/2017   Procedure: COLONOSCOPY WITH PROPOFOL;  Surgeon: Rachael Fee, MD;  Location: [facility];  Service: Endoscopy;  Laterality: N/A;   ESOPHAGOGASTRODUODENOSCOPY N/A 07/24/2017   Procedure: ESOPHAGOGASTRODUODENOSCOPY (EGD);  Surgeon: Rachael Fee, MD;  Location: [facility];  Service: Endoscopy;  Laterality: N/A;   ESOPHAGOGASTRODUODENOSCOPY (EGD) WITH PROPOFOL N/A 10/19/2020   Procedure: ESOPHAGOGASTRODUODENOSCOPY (EGD) WITH PROPOFOL;  Surgeon: Rachael Fee, MD;  Location: [facility];  Service: Endoscopy;  Laterality: N/A;   FINE NEEDLE ASPIRATION N/A 10/19/2020   Procedure: FINE NEEDLE ASPIRATION (FNA) LINEAR;  Surgeon: Rachael Fee, MD;  Location: [facility];  Service: Endoscopy;  Laterality: N/A;   GALLBLADDER SURGERY  1991   HIP  CLOSED REDUCTION Right 01/08/2016   Procedure: CLOSED MANIPULATION HIP;  Surgeon: Jene Every, MD;  Location: [facility];  Service: Orthopedics;  Laterality: Right;   HIP CLOSED REDUCTION Right 01/19/2016   Procedure: ATTEMPTED CLOSED REDUCTION RIGHT HIP;  Surgeon: Toni Arthurs, MD;  Location: [facility];  Service: Orthopedics;  Laterality: Right;   HIP CLOSED REDUCTION Right 01/20/2016   Procedure: CLOSED REDUCTION RIGHT TOTAL HIP;  Surgeon: Durene Romans, MD;  Location: [facility];  Service: Orthopedics;  Laterality: Right;   HIP CLOSED REDUCTION Right 02/17/2016   Procedure: CLOSED REDUCTION RIGHT TOTAL HIP;  Surgeon: Samson Frederic, MD;  Location: [facility];  Service: Orthopedics;  Laterality: Right;   HIP CLOSED REDUCTION Right 02/28/2016   Procedure: CLOSED REDUCTION HIP;  Surgeon: Yolonda Kida, MD;  Location: [facility];  Service: Orthopedics;  Laterality: Right;   IR IMAGING GUIDED PORT INSERTION  11/01/2020   POLYPECTOMY  07/24/2017   Procedure: POLYPECTOMY;  Surgeon: Rachael Fee, MD;  Location: [facility];  Service: Endoscopy;;   TONSILLECTOMY     TOTAL ABDOMINAL HYSTERECTOMY     1985, with 1 ovary removed and 2 nd ovary removed 2003   TOTAL HIP ARTHROPLASTY Right    Original surgery 2006 with revision 2010   TOTAL HIP REVISION Right 01/01/2016   Procedure: TOTAL HIP REVISION;  Surgeon: Durene Romans, MD;  Location: [facility];  Service: Orthopedics;  Laterality: Right;   TOTAL HIP REVISION Right 03/02/2016   Procedure: TOTAL HIP REVISION;  Surgeon: Durene Romans, MD;  Location: [facility];  Service: Orthopedics;  Laterality: Right;   TOTAL HIP REVISION Right 09/02/2016   Procedure: Right hip constrained liner- posterior;  Surgeon: Durene Romans, MD;  Location: [facility];  Service: Orthopedics;  Laterality: Right;   ULNAR NERVE TRANSPOSITION Right    UPPER ESOPHAGEAL ENDOSCOPIC ULTRASOUND (EUS) N/A 10/19/2020   Procedure: UPPER ESOPHAGEAL ENDOSCOPIC ULTRASOUND (EUS);  Surgeon: Rachael Fee, MD;   Location: [facility];  Service: Endoscopy;  Laterality: N/A;  periduodenal lesion    SOCIAL HISTORY: Social History   Socioeconomic History   Marital status: Married    Spouse name: Not on file   Number of children: 2   Years of education: Not on file   Highest education level: Not on file  Occupational History   Occupation: disabled   Occupation: disabled  Tobacco Use   Smoking status: Every Day    Packs/day: 2.00    Years: 46.00    Additional pack years: 0.00    Total pack years: 92.00    Types: Cigarettes   Smokeless tobacco: Never   Tobacco comments:    2 packs of cigarettes smoked daily 12/13/21- declines smoking cessation  Vaping Use   Vaping Use: Never used  Substance and Sexual Activity   Alcohol use: No   Drug use: No   Sexual activity: Not Currently    Partners: Male  Other Topics Concern   Not on file  Social History Narrative   Right handed    Caffeine~ 2 cups per day    Lives at home with husband (strained relationship)   Primary caretaker for disabled brother who had aneurism   Daughter died 06-17-18   Social Determinants of Health   Financial Resource Strain: Low Risk  (12/13/2021)   Overall Financial Resource Strain (CARDIA)    Difficulty of Paying Living Expenses: Not very hard  Food Insecurity: No Food Insecurity (12/13/2021)   Hunger Vital Sign    Worried About Running Out of Food in the Last Year: Never true    Ran Out of Food in the Last Year: Never true  Transportation Needs: No Transportation Needs (12/13/2021)   PRAPARE - Administrator, Civil Service (Medical): No    Lack of Transportation (Non-Medical): No  Physical Activity: Inactive (12/13/2021)   Exercise Vital Sign    Days of Exercise per Week: 0 days    Minutes of Exercise per Session: 0 min  Stress: Stress Concern Present (12/13/2021)   [employer] of Occupational Health - Occupational Stress Questionnaire    Feeling of Stress : To some extent  Social  Connections: Moderately Integrated (12/13/2021)   Social Connection and Isolation Panel [NHANES]    Frequency of Communication with Friends and Family: Twice a week    Frequency of Social Gatherings with Friends and Family: Once a week    Attends Religious Services: 1 to 4 times per year    Active Member of [employer] or Organizations: No    Attends Banker Meetings: Never    Marital Status: Married  Catering manager Violence: Not At Risk (05/03/2021)   Humiliation, Afraid, Rape, and Kick questionnaire    Fear of Current or Ex-Partner: No    Emotionally Abused: No    Physically Abused: No    Sexually Abused: No    FAMILY HISTORY: Family History  Problem Relation Age of Onset   COPD Mother    Heart disease Mother    Lung disease Father        Asbestosis  Heart attack Father    Heart disease Father    Cerebral aneurysm Brother    Aneurysm Brother        Brain   Drug abuse Daughter    Epilepsy Son    Alcohol abuse Son    Drug abuse Son    Arthritis Maternal Grandmother    Heart disease Maternal Grandmother    Asthma Maternal Grandfather    Cancer Maternal Grandfather    Arthritis Paternal Grandmother    Heart disease Paternal Grandmother    Stroke Paternal Grandmother    Early death Paternal Grandfather    Heart disease Paternal Grandfather     ALLERGIES:  is allergic to metformin and related, nsaids, wellbutrin [bupropion], aleve [naproxen sodium], codeine, penicillins, and sulfonamide derivatives.  MEDICATIONS:  Current Outpatient Medications  Medication Sig Dispense Refill   albuterol (PROAIR HFA) 108 (90 Base) MCG/ACT inhaler 2 puffs every 4 hours as needed only  if your can't catch your breath 18 g 3   ALPRAZolam (XANAX) 1 MG tablet Take 1 tablet (1 mg total) by mouth 3 (three) times daily as needed. for anxiety 90 tablet 2   atorvastatin (LIPITOR) 20 MG tablet TAKE 1 TABLET BY MOUTH AT BEDTIME 90 tablet 1   benzonatate (TESSALON) 100 MG capsule Take 1  capsule (100 mg total) by mouth 3 (three) times daily. 90 capsule 1   Budeson-Glycopyrrol-Formoterol (BREZTRI AEROSPHERE) 160-9-4.8 MCG/ACT AERO Inhale 2 puffs into the lungs in the morning and at bedtime. 10.7 g 3   cyclobenzaprine (FLEXERIL) 10 MG tablet Take 10 mg by mouth 3 (three) times daily as needed.     dicyclomine (BENTYL) 10 MG capsule TAKE ONE CAPSULE BY MOUTH TWICE DAILY 60 capsule 1   diphenoxylate-atropine (LOMOTIL) 2.5-0.025 MG tablet Take 1 tablet by mouth 4 (four) times daily as needed for diarrhea or loose stools. 60 tablet 0   DULoxetine (CYMBALTA) 60 MG capsule Take 2 capsules (120 mg total) by mouth daily. 180 capsule 1   fluticasone (FLONASE) 50 MCG/ACT nasal spray INSTILL 2 SPRAYS IN EACH NOSTRIL EVERY DAY 16 g 6   furosemide (LASIX) 20 MG tablet TAKE 1 TABLET BY MOUTH TWICE DAILY, IF SWELLING IMPROVES CAN HOLD MEDICATION 90 tablet 0   guaiFENesin-codeine 100-10 MG/5ML syrup Take 5 mLs by mouth every 6 (six) hours as needed for cough. 120 mL 0   hydrOXYzine (ATARAX) 10 MG tablet Take 1 tablet (10 mg total) by mouth 3 (three) times daily as needed for itching. 30 tablet 0   levothyroxine (SYNTHROID) 137 MCG tablet Take 1 tablet (137 mcg total) by mouth daily before breakfast. 90 tablet 1   lipase/protease/amylase (CREON) 36000 UNITS CPEP capsule Take 2 capsules (72,000 Units total) by mouth 3 (three) times daily with meals. 180 capsule 1   loratadine (CLARITIN) 10 MG tablet Take 10 mg by mouth daily.     meloxicam (MOBIC) 15 MG tablet TAKE 1 TABLET BY MOUTH EVERY DAY 30 tablet 2   OLANZapine (ZYPREXA) 10 MG tablet Take 1 tablet (10 mg total) by mouth at bedtime. 30 tablet 2   omeprazole (PRILOSEC) 40 MG capsule TAKE ONE CAPSULE BY MOUTH TWICE DAILY 180 capsule 3   ondansetron (ZOFRAN) 8 MG tablet TAKE 1 TABLET BY MOUTH TWICE DAILY 60 tablet 1   Oxycodone HCl 10 MG TABS Take 1 tablet (10 mg total) by mouth every 4 (four) hours as needed. Goal - to replace Norco- max 5 tabs/day  150 tablet 0   potassium chloride  SA (KLOR-CON M) 20 MEQ tablet TAKE 1 TABLET BY MOUTH TWICE DAILY 60 tablet 2   prochlorperazine (COMPAZINE) 10 MG tablet Take 1 tablet (10 mg total) by mouth every 6 (six) hours as needed for nausea or vomiting. 30 tablet 0   tacrolimus (PROTOPIC) 0.1 % ointment Apply topically 2 (two) times daily. APPLY TO ITCHY AREAS TWICE A DAY FOR UP TO TWO WEEKS. ALTERNATE WITH TRIAMCINOLONE CREAM 100 g 0   triamcinolone ointment (KENALOG) 0.5 % Apply 1 Application topically 3 (three) times daily. Apply thin-layer on affected areas, no more than 2 weeks consistently. 60 g 2   triamterene-hydrochlorothiazide (MAXZIDE-25) 37.5-25 MG tablet TAKE 1 TABLET BY MOUTH DAILY 90 tablet 2   Current Facility-Administered Medications  Medication Dose Route Frequency Provider Last Rate Last Admin   sodium chloride flush (NS) 0.9 % injection 10 mL  10 mL Intravenous PRN Georga Kaufmann T, PA-C   10 mL at 05/08/22 1143    REVIEW OF SYSTEMS:   Constitutional: ( - ) fevers, ( - )  chills , ( - ) night sweats Eyes: ( - ) blurriness of vision, ( - ) double vision, ( - ) watery eyes Ears, nose, mouth, throat, and face: ( - ) mucositis, ( - ) sore throat Respiratory: ( - ) cough, ( -) dyspnea, ( - ) wheezes Cardiovascular: ( - ) palpitation, ( - ) chest discomfort, ( - ) lower extremity swelling Gastrointestinal:  ( +) nausea, ( - ) heartburn, ( - ) change in bowel habits Skin: ( - ) abnormal skin rashes Lymphatics: ( - ) new lymphadenopathy, ( - ) easy bruising Neurological: (+ ) numbness, ( - ) tingling, ( - ) new weaknesses Behavioral/Psych: ( - ) mood change, ( - ) new changes  All other systems were reviewed with the patient and are negative.  PHYSICAL EXAMINATION: ECOG PERFORMANCE STATUS: 1 - Symptomatic but completely ambulatory  Vitals:   05/08/22 1035  BP: (!) 141/75  Pulse: 90  Resp: 18  Temp: 98.2 F (36.8 C)  SpO2: 96%    Filed Weights   05/08/22 1035  Weight: 197 lb  3.2 oz (89.4 kg)     GENERAL: Well-appearing middle-age Caucasian female, alert, no distress and comfortable SKIN: skin color, texture, turgor are normal, no rashes or significant lesions EYES: conjunctiva are pink and non-injected, sclera clear LUNGS:  normal breathing effort. Diffuse wheezing hear on ausculation.  HEART: regular rate & rhythm and no murmurs. Mild bilateral lower extremity edema Musculoskeletal: no cyanosis of digits and no clubbing  PSYCH: alert & oriented x 3, fluent speech NEURO: no focal motor/sensory deficits  LABORATORY DATA:  I have reviewed the data as listed    Latest Ref Rng & Units 05/08/2022    9:58 AM 04/17/2022    1:57 PM 03/27/2022    9:28 AM  CBC  WBC 4.0 - 10.5 K/uL 8.5  8.0  9.6   Hemoglobin 12.0 - 15.0 g/dL 10.9  32.3  55.7   Hematocrit 36.0 - 46.0 % 37.0  36.2  38.6   Platelets 150 - 400 K/uL 169  161  200        Latest Ref Rng & Units 05/08/2022    9:58 AM 04/17/2022    1:57 PM 03/27/2022    9:28 AM  CMP  Glucose 70 - 99 mg/dL 92  89  97   BUN 8 - 23 mg/dL Creatinine 0.44 - 1.00 mg/dL  1.06  0.85  0.94   Sodium 135 - 145 mmol/L 135  133  134   Potassium 3.5 - 5.1 mmol/L 3.8  3.7  3.9   Chloride 98 - 111 mmol/L 98  100  98   CO2 22 - 32 mmol/L 30  27  29    Calcium 8.9 - 10.3 mg/dL 8.7  8.1  8.4   Total Protein 6.5 - 8.1 g/dL 6.4  5.9  6.2   Total Bilirubin 0.3 - 1.2 mg/dL 0.3  0.5  0.3   Alkaline Phos 38 - 126 U/L 64  59  72   AST 15 - 41 U/L 13  13  10    ALT 0 - 44 U/L 7  10  7      No results found for: "MPROTEIN" Lab Results  Component Value Date   KPAFRELGTCHN 0.75 06/30/2014   LAMBDASER 3.78 (H) 06/30/2014   KAPLAMBRATIO 0.20 (L) 06/30/2014     RADIOGRAPHIC STUDIES: CT CHEST ABDOMEN PELVIS W CONTRAST  Result Date: 04/28/2022 CLINICAL DATA:  67 year old female with history of small cell lung cancer undergoing ongoing chemotherapy. Evaluate for treatment response. * Tracking Code: BO * EXAM: CT CHEST, ABDOMEN, AND  PELVIS WITH CONTRAST TECHNIQUE: Multidetector CT imaging of the chest, abdomen and pelvis was performed following the standard protocol during bolus administration of intravenous contrast. RADIATION DOSE REDUCTION: This exam was performed according to the departmental dose-optimization program which includes automated exposure control, adjustment of the mA and/or kV according to patient size and/or use of iterative reconstruction technique. CONTRAST:  OMNIPAQUE IOHEXOL 300 MG/ML  SOLN COMPARISON:  CT of the chest, abdomen and pelvis 01/22/2022. FINDINGS: CT CHEST FINDINGS Cardiovascular: Heart size is normal. There is no significant pericardial fluid, thickening or pericardial calcification. There is aortic atherosclerosis, as well as atherosclerosis of the great vessels of the mediastinum and the coronary arteries, including calcified atherosclerotic plaque in the left main, left anterior descending, left circumflex and right coronary arteries. Right internal jugular single-lumen Port-A-Cath with tip terminating in the right atrium. Mediastinum/Nodes: No pathologically enlarged mediastinal or hilar lymph nodes. Esophagus is unremarkable in appearance. No axillary lymphadenopathy. Lungs/Pleura: No suspicious appearing pulmonary nodules or masses are noted. No acute consolidative airspace disease. No pleural effusions. Mild diffuse bronchial wall thickening with mild centrilobular and paraseptal emphysema. Musculoskeletal: There are no aggressive appearing lytic or blastic lesions noted in the visualized portions of the skeleton. CT ABDOMEN PELVIS FINDINGS Hepatobiliary: Liver has a slightly nodular contour, suggesting early changes of cirrhosis. No suspicious cystic or solid hepatic lesions. Status post cholecystectomy. No intra or extrahepatic biliary ductal dilatation. Pancreas: No pancreatic mass. No pancreatic ductal dilatation. No pancreatic or peripancreatic fluid collections or inflammatory changes.  Spleen: Unremarkable. Adrenals/Urinary Tract: Vague intermediate attenuation lesion in the posterolateral aspect of the interpolar region of the right kidney (axial image 55 of series 2 and coronal image 114 of series 4) measuring 2.0 x 1.2 x 1.5 cm, not readily apparent on remote prior examination from 2019, but demonstrating slow steady interval growth compared to that time. Left kidney is unremarkable in appearance. Right adrenal gland is normal in appearance. Similar 1.3 x 1.1 cm left adrenal nodule, previously characterized as a benign adenoma (no imaging follow-up recommended). No hydroureteronephrosis. Urinary bladder is partially obscured by beam hardening artifact from the patient's right hip arthroplasty, although there appears to be a tiny locule of gas non dependently in the lumen of the urinary bladder (axial image 103 of series 2). Stomach/Bowel: The  appearance of the stomach is normal. There is no pathologic dilatation of small bowel or colon. The appendix is not confidently identified and may be surgically absent. Regardless, there are no inflammatory changes noted adjacent to the cecum to suggest the presence of an acute appendicitis at this time. Vascular/Lymphatic: Aortic atherosclerosis, without evidence of aneurysm or dissection in the abdominal or pelvic vasculature. No lymphadenopathy noted in the abdomen or pelvis. Reproductive: Status post hysterectomy. Ovaries are not confidently identified may be surgically absent, atrophic or obscured by beam hardening artifact. Other: No significant volume of ascites.  No pneumoperitoneum. Musculoskeletal: Status post right hip arthroplasty. There are no aggressive appearing lytic or blastic lesions noted in the visualized portions of the skeleton. IMPRESSION: 1. No definitive findings to suggest locally recurrent disease or definite metastatic disease in the chest, abdomen or pelvis. 2. However, there has been slow interval growth of an indeterminate  lesion in the posterolateral aspect of the interpolar region of the right kidney. A slow growing neoplasm such as a papillary renal cell carcinoma is not excluded. Further evaluation with nonemergent abdominal MRI with and without IV gadolinium is recommended in the near future. 3. Aortic atherosclerosis, in addition to left main and three-vessel coronary artery disease. Please note that although the presence of coronary artery calcium documents the presence of coronary artery disease, the severity of this disease and any potential stenosis cannot be assessed on this non-gated CT examination. Assessment for potential risk factor modification, dietary therapy or pharmacologic therapy may be warranted, if clinically indicated. 4. Mild diffuse bronchial wall thickening with mild centrilobular and paraseptal emphysema; imaging findings suggestive of underlying COPD. 5. Nodular contour of the liver suggesting early changes of cirrhosis. 6. Small left adrenal adenoma incidentally noted. 7. Additional incidental findings, as above. Electronically Signed   By: Trudie Reed M.D.   On: 04/28/2022 13:07    ASSESSMENT & PLAN SONG STEADY 67 y.o. female with medical history significant for extensive stage small cell lung cancer who presents for a follow up visit.   After review of the labs, review of the records, and discussion with the patient the patients findings are most consistent with extensive stage small cell lung cancer with metastasis from the right lower lobe to the lymph nodes of the abdomen.  At this time we will pursue triple therapy with carboplatin, etoposide, and atezolizumab.  After 4 cycles we will convert to maintenance atezolizumab alone.  We previously discussed the risks and benefits of this therapy and the patient was in agreement to proceed with this treatment.  The treatment of choice consist of carboplatin, etoposide, and atezolizumab.  The regimen consists of carboplatin AUC of 5 IV on  day 1, etoposide 100 mg per metered squared IV on day 1, 2, and 3 and atezolizumab 1200 mg on day 1.  This continues for 21-day cycles.  After 4 cycles the patient proceeds with atezolizumab maintenance therapy alone.    # Small Cell Lung Cancer, Extensive Stage -- MRI of the brain shows no evidence of intracranial spread --Findings are currently consistent with metastatic small cell lung cancer with metastatic spread to the lymph nodes of the abdomen --Prognosis is poor with anticipated life span of less than 12 months -- Patient has extensive stage due to metastatic spread to lymph nodes in the abdomen --Plan to proceed with carboplatin, etoposide, and atezolizumab --will consider prophylactic cranial radiation after start of chemotherapy.  Plan: --today is Cycle 25 Day 1 of Atezolizumab maintenance  --  labs today were reviewed and adequate for treatment. Hgb 12.0, white blood cell 8.5, MCV 83.5, and platelets of 169 --reviewed CT CAP from 04/26/2022 which shows no evidence of recurrence.  --no prohibitive toxicities identified. Patient is requesting a treatment holiday due to ongoing fatigue so we will hold treatment today.  --RTC in 3 weeks for a follow up to determine if she will resume treatment.   #Right renal lesion: --Etiology unknown but recent CT scan from 04/26/2022 shows slow interval growth.  --We will order an MRI abdomen to further evaluate.   #Diarrhea-improved --stool sent for C. Diff and GI pathology panel, all negative --patient following with GI, recently found to have pancreatic insufficiency and started on creon with improvement of symptoms.   #Nausea: --Symptoms improved with zofran and olanzapine.  --Added IV zofran to infusion premeds for better symptom control.   #Hypokalemia:  --currently taking potassium chloride 20 meq BID --potassium level is still 3.8 today. Continue PO supplementation.   #Lower extremity edema-worsening --Currently takes lasix 20 mg  daily, recommending increasing to q 6 hours x 2 doses daily.  --Stable. Continue to monitor.   #Neuropathy involving fingers/feet: --Patient currently takes Cymbalta   #Supportive Care -- chemotherapy education complete -- port placed -- zofran 8mg  q8H PRN and compazine 10mg  PO q6H for nausea -- EMLA cream for port  Orders Placed This Encounter  Procedures   TSH    Standing Status:   Future    Standing Expiration Date:   05/30/2023   T4    Standing Status:   Future    Standing Expiration Date:   05/30/2023   CBC with Differential (Cancer Center Only)    Standing Status:   Future    Standing Expiration Date:   05/30/2023   CMP (Cancer Center only)    Standing Status:   Future    Standing Expiration Date:   05/30/2023   All questions were answered. The patient knows to call the clinic with any problems, questions or concerns.  I have spent a total of 30 minutes minutes of face-to-face and non-face-to-face time, preparing to see the patient, performing a medically appropriate examination, counseling and educating the patient, ordering medications/tests, documenting clinical information in the electronic health record, and care coordination.   Georga KaufmannIrene Deniyah Dillavou PA-C Dept of Hematology and Oncology [facility] Phone: [phone]   05/08/2022 12:10 PM

## 2022-05-09 ENCOUNTER — Other Ambulatory Visit: Payer: Self-pay | Admitting: Physician Assistant

## 2022-05-09 MED ORDER — HYDROXYZINE HCL 10 MG PO TABS: 10.0000 mg | ORAL_TABLET | Freq: Three times a day (TID) | ORAL | 0 refills | Status: DC | PRN

## 2022-05-10 ENCOUNTER — Other Ambulatory Visit: Payer: Self-pay | Admitting: Physician Assistant

## 2022-05-10 LAB — T4: T4, Total: 8.8 ug/dL (ref 4.5–12.0)

## 2022-05-14 ENCOUNTER — Other Ambulatory Visit: Payer: Self-pay | Admitting: Hematology and Oncology

## 2022-05-14 DIAGNOSIS — R600 Localized edema: Secondary | ICD-10-CM

## 2022-05-16 ENCOUNTER — Ambulatory Visit (INDEPENDENT_AMBULATORY_CARE_PROVIDER_SITE_OTHER): Payer: 59

## 2022-05-16 VITALS — Wt 195.0 lb

## 2022-05-16 DIAGNOSIS — Z0000 Encounter for general adult medical examination without abnormal findings: Secondary | ICD-10-CM | POA: Diagnosis not present

## 2022-05-16 DIAGNOSIS — Z1231 Encounter for screening mammogram for malignant neoplasm of breast: Secondary | ICD-10-CM

## 2022-05-16 NOTE — Progress Notes (Signed)
I connected with  [name] on 05/16/22 by a audio enabled telemedicine application and verified that I am speaking with the correct person using two identifiers.  Patient Location: Home  Provider Location: Office/Clinic  I discussed the limitations of evaluation and management by telemedicine. The patient expressed understanding and agreed to proceed.   Subjective:   [name] is a 67 y.o. female who presents for Medicare Annual (Subsequent) preventive examination.  Review of Systems     Cardiac Risk Factors include: advanced age (>79men, >45 women);dyslipidemia;diabetes mellitus;obesity (BMI >30kg/m2);hypertension;sedentary lifestyle;smoking/ tobacco exposure     Objective:    Today's Vitals   05/16/22 1351  Weight: 195 lb (88.5 kg)   Body mass index is 35.1 kg/m.     05/16/2022    1:59 PM 03/27/2022   10:38 AM 10/10/2021   12:54 PM 06/06/2021    2:57 PM 05/03/2021    1:38 PM 02/22/2021    1:51 PM 12/19/2020    7:03 PM  Advanced Directives  Does Patient Have a Medical Advance Directive? Yes Yes Yes Yes Yes Yes Yes  Type of Estate agent of [place];Living will Healthcare Power of [employer] Power of [employer] Power of [place];Living will  Does patient want to make changes to medical advance directive? No - Patient declined  No - Patient declined No - Patient declined  No - Patient declined No - Patient declined  Copy of Healthcare Power of Attorney in Chart? Yes - validated most recent copy scanned in chart (See row information) Yes - validated most recent copy scanned in chart (See row information)   Yes - validated most recent copy scanned in chart (See row information)  No - copy requested  Would patient like information on creating a medical advance directive? No - Patient declined No - Patient declined     No - Patient declined    Current Medications (verified) Outpatient Encounter Medications as of 05/16/2022   Medication Sig   albuterol (PROAIR HFA) 108 (90 Base) MCG/ACT inhaler 2 puffs every 4 hours as needed only  if your can't catch your breath   ALPRAZolam (XANAX) 1 MG tablet Take 1 tablet (1 mg total) by mouth 3 (three) times daily as needed. for anxiety   atorvastatin (LIPITOR) 20 MG tablet TAKE 1 TABLET BY MOUTH AT BEDTIME   benzonatate (TESSALON) 100 MG capsule Take 1 capsule (100 mg total) by mouth 3 (three) times daily.   Budeson-Glycopyrrol-Formoterol (BREZTRI AEROSPHERE) 160-9-4.8 MCG/ACT AERO Inhale 2 puffs into the lungs in the morning and at bedtime.   cyclobenzaprine (FLEXERIL) 10 MG tablet Take 10 mg by mouth 3 (three) times daily as needed.   dicyclomine (BENTYL) 10 MG capsule TAKE ONE CAPSULE BY MOUTH TWICE DAILY   diphenoxylate-atropine (LOMOTIL) 2.5-0.025 MG tablet Take 1 tablet by mouth 4 (four) times daily as needed for diarrhea or loose stools.   DULoxetine (CYMBALTA) 60 MG capsule Take 2 capsules (120 mg total) by mouth daily.   fluticasone (FLONASE) 50 MCG/ACT nasal spray INSTILL 2 SPRAYS IN EACH NOSTRIL EVERY DAY   furosemide (LASIX) 20 MG tablet TAKE 1 TABLET BY MOUTH TWICE DAILY, IF SWELLING IMPROVES CAN HOLD MEDICATION   guaiFENesin-codeine 100-10 MG/5ML syrup Take 5 mLs by mouth every 6 (six) hours as needed for cough.   hydrOXYzine (ATARAX) 10 MG tablet Take 1 tablet (10 mg total) by mouth 3 (three) times daily as needed for itching.   levothyroxine (SYNTHROID) 137 MCG tablet  Take 1 tablet (137 mcg total) by mouth daily before breakfast.   lipase/protease/amylase (CREON) 36000 UNITS CPEP capsule Take 2 capsules (72,000 Units total) by mouth 3 (three) times daily with meals.   loratadine (CLARITIN) 10 MG tablet Take 10 mg by mouth daily.   meloxicam (MOBIC) 15 MG tablet TAKE 1 TABLET BY MOUTH EVERY DAY   OLANZapine (ZYPREXA) 10 MG tablet Take 1 tablet (10 mg total) by mouth at bedtime.   omeprazole (PRILOSEC) 40 MG capsule TAKE ONE CAPSULE BY MOUTH TWICE DAILY    ondansetron (ZOFRAN) 8 MG tablet TAKE 1 TABLET BY MOUTH TWICE DAILY   Oxycodone HCl 10 MG TABS Take 1 tablet (10 mg total) by mouth every 4 (four) hours as needed. Goal - to replace Norco- max 5 tabs/day   potassium chloride SA (KLOR-CON M) 20 MEQ tablet TAKE 1 TABLET BY MOUTH TWICE DAILY   prochlorperazine (COMPAZINE) 10 MG tablet Take 1 tablet (10 mg total) by mouth every 6 (six) hours as needed for nausea or vomiting.   tacrolimus (PROTOPIC) 0.1 % ointment Apply topically 2 (two) times daily. APPLY TO ITCHY AREAS TWICE A DAY FOR UP TO TWO WEEKS. ALTERNATE WITH TRIAMCINOLONE CREAM   triamcinolone ointment (KENALOG) 0.5 % Apply 1 Application topically 3 (three) times daily. Apply thin-layer on affected areas, no more than 2 weeks consistently.   triamterene-hydrochlorothiazide (MAXZIDE-25) 37.5-25 MG tablet TAKE 1 TABLET BY MOUTH DAILY   No facility-administered encounter medications on file as of 05/16/2022.    Allergies (verified) Metformin and related, Nsaids, Wellbutrin [bupropion], Aleve [naproxen sodium], Codeine, Penicillins, and Sulfonamide derivatives   History: Past Medical History:  Diagnosis Date   Allergic rhinitis    Anemia    Anxiety    Chicken pox    Chronic back pain    COPD (chronic obstructive pulmonary disease)    Depression    DM (diabetes mellitus)    Essential hypertension    GERD (gastroesophageal reflux disease)    Headache    migraines   History of gastritis    EGD 2015   History of home oxygen therapy    2 liters at hs last 6 months   Hyperlipidemia    Hypothyroidism    Migraines    Osteoarthritis    oa   Scoliosis    Past Surgical History:  Procedure Laterality Date   APPENDECTOMY     1985   BIOPSY  07/24/2017   Procedure: BIOPSY;  Surgeon: Rachael Fee, MD;  Location: [facility];  Service: Endoscopy;;   CARDIAC CATHETERIZATION N/A 10/31/2015   Procedure: Left Heart Cath and Coronary Angiography;  Surgeon: Marykay Lex, MD;  [facility]:  [facility] INVASIVE CV LAB;  Service: Cardiovascular;  Laterality: N/A;   CARPAL TUNNEL RELEASE Left    CARPAL TUNNEL RELEASE Right    CHOLECYSTECTOMY  late 1980's   COLONOSCOPY WITH PROPOFOL N/A 07/24/2017   Procedure: COLONOSCOPY WITH PROPOFOL;  Surgeon: Rachael Fee, MD;  Location: [facility];  Service: Endoscopy;  Laterality: N/A;   ESOPHAGOGASTRODUODENOSCOPY N/A 07/24/2017   Procedure: ESOPHAGOGASTRODUODENOSCOPY (EGD);  Surgeon: Rachael Fee, MD;  Location: [facility];  Service: Endoscopy;  Laterality: N/A;   ESOPHAGOGASTRODUODENOSCOPY (EGD) WITH PROPOFOL N/A 10/19/2020   Procedure: ESOPHAGOGASTRODUODENOSCOPY (EGD) WITH PROPOFOL;  Surgeon: Rachael Fee, MD;  [facility]: [facility];  Service: Endoscopy;  Laterality: N/A;   FINE NEEDLE ASPIRATION N/A 10/19/2020   Procedure: FINE NEEDLE ASPIRATION (FNA) LINEAR;  Surgeon: Rachael Fee, MD;  Location: [facility];  Service: Endoscopy;  Laterality: N/A;   GALLBLADDER SURGERY  1991   HIP CLOSED REDUCTION Right 01/08/2016   Procedure: CLOSED MANIPULATION HIP;  Surgeon: Jene Every, MD;  Location: [facility];  Service: Orthopedics;  Laterality: Right;   HIP CLOSED REDUCTION Right 01/19/2016   Procedure: ATTEMPTED CLOSED REDUCTION RIGHT HIP;  Surgeon: Toni Arthurs, MD;  Location: [facility];  Service: Orthopedics;  Laterality: Right;   HIP CLOSED REDUCTION Right 01/20/2016   Procedure: CLOSED REDUCTION RIGHT TOTAL HIP;  Surgeon: Durene Romans, MD;  Location: [facility];  Service: Orthopedics;  Laterality: Right;   HIP CLOSED REDUCTION Right 02/17/2016   Procedure: CLOSED REDUCTION RIGHT TOTAL HIP;  Surgeon: Samson Frederic, MD;  Location: [facility];  Service: Orthopedics;  Laterality: Right;   HIP CLOSED REDUCTION Right 02/28/2016   Procedure: CLOSED REDUCTION HIP;  Surgeon: Yolonda Kida, MD;  Location: [facility];  Service: Orthopedics;  Laterality: Right;   IR IMAGING GUIDED PORT INSERTION  11/01/2020   POLYPECTOMY  07/24/2017   Procedure: POLYPECTOMY;   Surgeon: Rachael Fee, MD;  [facility]: [facility];  Service: Endoscopy;;   TONSILLECTOMY     TOTAL ABDOMINAL HYSTERECTOMY     1985, with 1 ovary removed and 2 nd ovary removed 2003   TOTAL HIP ARTHROPLASTY Right    Original surgery 2006 with revision 2010   TOTAL HIP REVISION Right 01/01/2016   Procedure: TOTAL HIP REVISION;  Surgeon: Durene Romans, MD;  Location: [facility];  Service: Orthopedics;  Laterality: Right;   TOTAL HIP REVISION Right 03/02/2016   Procedure: TOTAL HIP REVISION;  Surgeon: Durene Romans, MD;  Location: [facility];  Service: Orthopedics;  Laterality: Right;   TOTAL HIP REVISION Right 09/02/2016   Procedure: Right hip constrained liner- posterior;  Surgeon: Durene Romans, MD;  Location: [facility];  Service: Orthopedics;  Laterality: Right;   ULNAR NERVE TRANSPOSITION Right    UPPER ESOPHAGEAL ENDOSCOPIC ULTRASOUND (EUS) N/A 10/19/2020   Procedure: UPPER ESOPHAGEAL ENDOSCOPIC ULTRASOUND (EUS);  Surgeon: Rachael Fee, MD;  Location: [facility];  Service: Endoscopy;  Laterality: N/A;  periduodenal lesion   Family History  Problem Relation Age of Onset   COPD Mother    Heart disease Mother    Lung disease Father        Asbestosis   Heart attack Father    Heart disease Father    Cerebral aneurysm Brother    Aneurysm Brother        Brain   Drug abuse Daughter    Epilepsy Son    Alcohol abuse Son    Drug abuse Son    Arthritis Maternal Grandmother    Heart disease Maternal Grandmother    Asthma Maternal Grandfather    Cancer Maternal Grandfather    Arthritis Paternal Grandmother    Heart disease Paternal Grandmother    Stroke Paternal Grandmother    Early death Paternal Grandfather    Heart disease Paternal Grandfather    Social History   Socioeconomic History   Marital status: Married    Spouse name: Not on file   Number of children: 2   Years of education: Not on file   Highest education level: Not on file  Occupational History   Occupation: disabled    Occupation: disabled  Tobacco Use   Smoking status: Every Day    Packs/day: 2.00    Years: 46.00    Additional pack years: 0.00    Total pack years: 92.00    Types: Cigarettes   Smokeless  tobacco: Never   Tobacco comments:    2 packs of cigarettes smoked daily 12/13/21- declines smoking cessation  Vaping Use   Vaping Use: Never used  Substance and Sexual Activity   Alcohol use: No   Drug use: No   Sexual activity: Not Currently    Partners: Male  Other Topics Concern   Not on file  Social History Narrative   Right handed    Caffeine~ 2 cups per day    Lives at home with husband (strained relationship)   Primary caretaker for disabled brother who had aneurism   Daughter died 14-Jun-2018   Social Determinants of Health   Financial Resource Strain: Low Risk  (05/16/2022)   Overall Financial Resource Strain (CARDIA)    Difficulty of Paying Living Expenses: Not hard at all  Food Insecurity: No Food Insecurity (05/16/2022)   Hunger Vital Sign    Worried About Running Out of Food in the Last Year: Never true    Ran Out of Food in the Last Year: Never true  Transportation Needs: No Transportation Needs (05/16/2022)   PRAPARE - Administrator, Civil Service (Medical): No    Lack of Transportation (Non-Medical): No  Physical Activity: Inactive (05/16/2022)   Exercise Vital Sign    Days of Exercise per Week: 0 days    Minutes of Exercise per Session: 0 min  Stress: Stress Concern Present (05/16/2022)   [employer] of Occupational Health - Occupational Stress Questionnaire    Feeling of Stress : To some extent  Social Connections: Moderately Isolated (05/16/2022)   Social Connection and Isolation Panel [NHANES]    Frequency of Communication with Friends and Family: More than three times a week    Frequency of Social Gatherings with Friends and Family: Once a week    Attends Religious Services: Never    Database administrator or Organizations: No    Attends Museum/gallery exhibitions officer: Never    Marital Status: Married    Tobacco Counseling Ready to quit: Not Answered Counseling given: Not Answered Tobacco comments: 2 packs of cigarettes smoked daily 12/13/21- declines smoking cessation   Clinical Intake:  Pre-visit preparation completed: Yes  Pain : No/denies pain     BMI - recorded: 35.1 Nutritional Status: BMI > 30  Obese Nutritional Risks: None Diabetes: Yes CBG done?: No Did pt. bring in CBG monitor from home?: No  How often do you need to have someone help you when you read instructions, pamphlets, or other written materials from your doctor or pharmacy?: 1 - Never  Diabetic?Nutrition Risk Assessment:  Has the patient had any N/V/D within the last 2 months?  No  Does the patient have any non-healing wounds?  No  Has the patient had any unintentional weight loss or weight gain?  No   Diabetes:  Is the patient diabetic?  Yes  If diabetic, was a CBG obtained today?  No  Did the patient bring in their glucometer from home?  No  How often do you monitor your CBG's? N/a.   Financial Strains and Diabetes Management:  Are you having any financial strains with the device, your supplies or your medication? No .  Does the patient want to be seen by Chronic Care Management for management of their diabetes?  No  Would the patient like to be referred to a Nutritionist or for Diabetic Management?  No   Diabetic Exams:  Diabetic Eye Exam: Overdue for diabetic eye exam. Pt has  been advised about the importance in completing this exam. Patient advised to call and schedule an eye exam. Diabetic Foot Exam: Overdue, Pt has been advised about the importance in completing this exam. Pt is scheduled for diabetic foot exam on next appt .   Interpreter Needed?: No  Information entered by :: Lanier Ensign, LPN   Activities of Daily Living    05/16/2022    2:00 PM 12/13/2021    3:29 PM  In your present state of health, do you have any  difficulty performing the following activities:  Hearing? 1 1  Comment  bil hearing aides  Vision? 0 0  Difficulty concentrating or making decisions? 0 0  Walking or climbing stairs? 1 0  Comment slowly   Dressing or bathing? 1 0  Comment needs assistance   Doing errands, shopping? 0 0  Preparing Food and eating ? Y Y  Comment asistance spouse assists  Using the Toilet? N N  In the past six months, have you accidently leaked urine? N N  Do you have problems with loss of bowel control? Y N  Comment at times and wears a brief   Managing your Medications? N N  Managing your Finances? N N  Housekeeping or managing your Housekeeping? N Y  Comment  spouse assists with housekeeping/ laundry    Patient Care Team: Allwardt, Milus Mallick as PCP - General (Physician Assistant) Rachael Fee, MD as Attending Physician (Gastroenterology) Dr. Louie Bun, MD as Consulting Physician (Orthopedic Surgery) Ranee Gosselin, MD as Consulting Physician (Orthopedic Surgery) York Spaniel, MD (Inactive) as Consulting Physician (Neurology) Smitty Cords, OD as Consulting Physician (Optometry) Karel Jarvis Lesle Chris, MD as Consulting Physician (Neurology) Erroll Luna, [facility] (Pharmacist)  Indicate any recent Medical Services you may have received from other than [facility] providers in the past year (date may be approximate).     Assessment:   This is a routine wellness examination for Selinda.  Hearing/Vision screen Hearing Screening - Comments:: Pt wears hearing aids  Vision Screening - Comments:: Pt follows up with eye Dr in [place] for annual eye exams   Dietary issues and exercise activities discussed: Current Exercise Habits: The patient does not participate in regular exercise at present   Goals Addressed             This Visit's Progress    Patient Stated       Stay as healthy as I can        Depression Screen    05/16/2022    1:57 PM 04/15/2022   11:45 AM  12/13/2021    2:59 PM 10/22/2021    3:09 PM 06/22/2021    3:14 PM 05/03/2021    1:35 PM 03/05/2021    2:26 PM  PHQ 2/9 Scores  PHQ - 2 Score 0  PHQ- 9 Score Fall Risk    05/16/2022    2:00 PM 04/15/2022   11:45 AM 12/13/2021    3:09 PM 10/22/2021    3:09 PM 06/22/2021    3:13 PM  Fall Risk   Falls in the past year? 0 1  Comment     Last fall May 2023. Broken second toe on right foot.  Number falls in past yr: 0 1  Injury with Fall? 0 0 1 0 0  Risk for fall due to : Impaired vision;Impaired mobility;Impaired  balance/gait History of fall(s)     Follow up Falls prevention discussed        FALL RISK PREVENTION PERTAINING TO THE HOME:  Any stairs in or around the home? Yes  If so, are there any without handrails? No  Home free of loose throw rugs in walkways, pet beds, electrical cords, etc? Yes  Adequate lighting in your home to reduce risk of falls? Yes   ASSISTIVE DEVICES UTILIZED TO PREVENT FALLS:  Life alert? No  Use of a cane, walker or w/c? Yes  Grab bars in the bathroom? Yes  Shower chair or bench in shower? Yes  Elevated toilet seat or a handicapped toilet? No   TIMED UP AND GO:  Was the test performed? No .   Cognitive Function:      10/19/2019    1:00 PM  Montreal Cognitive Assessment   Visuospatial/ Executive (0/5) 3  Naming (0/3) 3  Attention: Read list of digits (0/2) 2  Attention: Read list of letters (0/1) 1  Attention: Serial 7 subtraction starting at 100 (0/3) 1  Language: Repeat phrase (0/2) 2  Language : Fluency (0/1) 1  Abstraction (0/2) 1  Delayed Recall (0/5) 5  Orientation (0/6) 5  Total 24  Adjusted Score (based on education) 24      05/16/2022    2:02 PM 05/03/2021    1:42 PM  6CIT Screen  What Year? 0 points 0 points  What month? 0 points 0 points  What time? 0 points 0 points  Count back from 20 0 points 0 points  Months in reverse 0 points 0 points  Repeat phrase 0 points 0 points  Total  Score 0 points 0 points    Immunizations Immunization History  Administered Date(s) Administered   Influenza,inj,Quad PF,6+ Mos 10/02/2018, 11/30/2019, 11/15/2020   Influenza-Unspecified 01/16/2011, 11/26/2011, 12/22/2012, 11/17/2018   PFIZER Comirnaty(Gray Top)Covid-19 Tri-Sucrose Vaccine 05/17/2020   PFIZER(Purple Top)SARS-COV-2 Vaccination 10/20/2019, 11/10/2019   PNEUMOCOCCAL CONJUGATE-20 09/04/2020   Td 06/07/2005      Flu Vaccine status: Due, Education has been provided regarding the importance of this vaccine. Advised may receive this vaccine at local pharmacy or Health Dept. Aware to provide a copy of the vaccination record if obtained from local pharmacy or Health Dept. Verbalized acceptance and understanding.  Pneumococcal vaccine status: Up to date  Covid-19 vaccine status: Completed vaccines  Qualifies for Shingles Vaccine? Yes   Zostavax completed No   Shingrix Completed?: No.    Education has been provided regarding the importance of this vaccine. Patient has been advised to call insurance company to determine out of pocket expense if they have not yet received this vaccine. Advised may also receive vaccine at local pharmacy or Health Dept. Verbalized acceptance and understanding.  Screening Tests Health Maintenance  Topic Date Due   FOOT EXAM  Never done   OPHTHALMOLOGY EXAM  Never done   Zoster Vaccines- Shingrix (1 of 2) Never done   MAMMOGRAM  05/28/2019   Diabetic kidney evaluation - Urine ACR  03/11/2020   COVID-19 Vaccine (4 - 2023-24 season) 09/28/2021   Hepatitis C Screening  08/18/2022 (Originally 01/29/1973)   INFLUENZA VACCINE  08/29/2022   HEMOGLOBIN A1C  09/04/2022   Diabetic kidney evaluation - eGFR measurement  05/08/2023   Medicare Annual Wellness (AWV)  05/16/2023   COLONOSCOPY (Pts 45-30yrs Insurance coverage will need to be confirmed)  07/25/2027   Pneumonia Vaccine 26+ Years old  Completed   DEXA SCAN  Completed   HPV  VACCINES  Aged Out    DTaP/Tdap/Td  Discontinued    Health Maintenance  Health Maintenance Due  Topic Date Due   FOOT EXAM  Never done   OPHTHALMOLOGY EXAM  Never done   Zoster Vaccines- Shingrix (1 of 2) Never done   MAMMOGRAM  05/28/2019   Diabetic kidney evaluation - Urine ACR  03/11/2020   COVID-19 Vaccine (4 - 2023-24 season) 09/28/2021    Colorectal cancer screening: Type of screening: Colonoscopy. Completed 07/24/17. Repeat every 10 years  Mammogram status: Ordered 05/16/22. Pt provided with contact info and advised to call to schedule appt.   Bone Density status: Completed 06/12/17. Results reflect: Bone density results: OSTEOPENIA. Repeat every 2 years.  Lung Cancer Screening: (Low Dose CT Chest recommended if Age 63-80 years, 30 pack-year currently smoking OR have quit w/in 15years.) does qualify.   Lung Cancer Screening Referral: pt staed she has a lot of appt at this time   Additional Screening:  Hepatitis C Screening: does qualify;  Vision Screening: Recommended annual ophthalmology exams for early detection of glaucoma and other disorders of the eye. Is the patient up to date with their annual eye exam?  No  Who is the provider or what is the name of the office in which the patient attends annual eye exams? Eye provider in [place]  If pt is not established with a provider, would they like to be referred to a provider to establish care? No .   Dental Screening: Recommended annual dental exams for proper oral hygiene  Community Resource Referral / Chronic Care Management: CRR required this visit?  No   CCM required this visit?  No      Plan:     I have personally reviewed and noted the following in the patient's chart:   Medical and social history Use of alcohol, tobacco or illicit drugs  Current medications and supplements including opioid prescriptions. Patient is currently taking opioid prescriptions. Information provided to patient regarding non-opioid alternatives.  Patient advised to discuss non-opioid treatment plan with their provider. Functional ability and status Nutritional status Physical activity Advanced directives List of other physicians Hospitalizations, surgeries, and ER visits in previous 12 months Vitals Screenings to include cognitive, depression, and falls Referrals and appointments  In addition, I have reviewed and discussed with patient certain preventive protocols, quality metrics, and best practice recommendations. A written personalized care plan for preventive services as well as general preventive health recommendations were provided to patient.     Marzella Schlein, LPN   [id number]/61/0960   Nurse Notes: none

## 2022-05-16 NOTE — Patient Instructions (Signed)
[name] , Thank you for taking time to come for your Medicare Wellness Visit. I appreciate your ongoing commitment to your health goals. Please review the following plan we discussed and let me know if I can assist you in the future.   These are the goals we discussed:  Goals      Patient Stated     Maintain healthy eating & drinking water     Patient Stated     Changing eating habits      Patient Stated     Stay as healthy as I can      PharmD Care Plan     CARE PLAN ENTRY (see longitudinal plan of care for additional care plan information)  Current Barriers:  Chronic Disease Management support, education, and care coordination needs related to Hypertension, Hyperlipidemia, Diabetes, and GERD   Hypertension BP Readings from Last 3 Encounters:  08/05/19 126/78  07/27/19 132/76  07/09/19 130/82  Pharmacist Clinical Goal(s):  Over the next 180 days, patient will work with PharmD and providers to maintain BP goal <140/90 Current regimen:  Triamterence-hydrochlorothiazide 37.5-25 mg once daily Interventions: Continue current management Patient self care activities - Over the next 180 days, patient will: Check BP once every 1-2 weeks, document, and provide at future appointments Ensure daily salt intake < 2300 mg/day  Hyperlipidemia Lab Results  Component Value Date/Time   LDLCALC 61 11/04/2018 03:22 PM   LDLDIRECT 72.0 08/31/2018 02:11 PM  Pharmacist Clinical Goal(s): Over the next 180 days, patient will work with PharmD and providers to maintain LDL goal < 100 Current regimen:  Atorvastatin 20 mg daily  Interventions: Continue current management Patient self care activities - Over the next 180 days, patient will: Continue current management  Diabetes Lab Results  Component Value Date/Time   HGBA1C 9.3 (A) 03/22/2019 02:44 PM  Pharmacist Clinical Goal(s): Over the next 180 days, patient will work with PharmD and providers to achieve A1c goal <7% Current  regimen:  Metformin 500 mg tablet - take two tablets every morning with breakfast Interventions: Continue current management Limit carbohydrates in diet Patient self care activities - Over the next 180 days, patient will: Check blood sugar once daily, document, and provide at future appointments Contact provider with any episodes of hypoglycemia  GERD Pharmacist Clinical Goal(s) Over the next 180 days, patient will work with PharmD and providers to minimize GERD related symptoms Current regimen:  Omeprazole 40 mg twice daily  Interventions: Continue current management Patient self care activities - Over the next 180 days, patient will: Continue current management  Medication management Pharmacist Clinical Goal(s): Over the next 180 days, patient will work with PharmD and providers to maintain optimal medication adherence Current pharmacy: [facility] Interventions Comprehensive medication review performed. Continue current medication management strategy Patient self care activities - Over the next 180 days, patient will: Take medications as prescribed Report any questions or concerns to PharmD and/or provider(s)  Initial goal documentation      Track and Manage My Symptoms-COPD     Timeframe:  Long-Range Goal Priority:  High Start Date:  05/07/21                           Expected End Date: 11/06/21                      Follow Up Date 08/06/21    Appropriate medication therapy at affordable copay.    Why  is this important?   Tracking your symptoms and other information about your health helps your doctor plan your care.  Write down the symptoms, the time of day, what you were doing and what medicine you are taking.  You will soon learn how to manage your symptoms.     Notes:         This is a list of the screening recommended for you and due dates:  Health Maintenance  Topic Date Due   Complete foot exam   Never done   Eye exam for diabetics  Never  done   Zoster (Shingles) Vaccine (1 of 2) Never done   Mammogram  05/28/2019   Yearly kidney health urinalysis for diabetes  03/11/2020   COVID-19 Vaccine (4 - 2023-24 season) 09/28/2021   Hepatitis C Screening: USPSTF Recommendation to screen - Ages 18-79 yo.  08/18/2022*   Flu Shot  08/29/2022   Hemoglobin A1C  09/04/2022   Yearly kidney function blood test for diabetes  05/08/2023   Medicare Annual Wellness Visit  05/16/2023   Colon Cancer Screening  07/25/2027   Pneumonia Vaccine  Completed   DEXA scan (bone density measurement)  Completed   HPV Vaccine  Aged Out   DTaP/Tdap/Td vaccine  Discontinued  *Topic was postponed. The date shown is not the original due date.    Advanced directives: copies in chart   Conditions/risks identified: stay as healthy as I can   Next appointment: Follow up in one year for your annual wellness visit    Preventive Care 65 Years and Older, Female Preventive care refers to lifestyle choices and visits with your health care provider that can promote health and wellness. What does preventive care include? A yearly physical exam. This is also called an annual well check. Dental exams once or twice a year. Routine eye exams. Ask your health care provider how often you should have your eyes checked. Personal lifestyle choices, including: Daily care of your teeth and gums. Regular physical activity. Eating a healthy diet. Avoiding tobacco and drug use. Limiting alcohol use. Practicing safe sex. Taking low-dose aspirin every day. Taking vitamin and mineral supplements as recommended by your health care provider. What happens during an annual well check? The services and screenings done by your health care provider during your annual well check will depend on your age, overall health, lifestyle risk factors, and family history of disease. Counseling  Your health care provider may ask you questions about your: Alcohol use. Tobacco use. Drug  use. Emotional well-being. Home and relationship well-being. Sexual activity. Eating habits. History of falls. Memory and ability to understand (cognition). Work and work Astronomer. Reproductive health. Screening  You may have the following tests or measurements: Height, weight, and BMI. Blood pressure. Lipid and cholesterol levels. These may be checked every 5 years, or more frequently if you are over 48 years old. Skin check. Lung cancer screening. You may have this screening every year starting at age 63 if you have a 30-pack-year history of smoking and currently smoke or have quit within the past 15 years. Fecal occult blood test (FOBT) of the stool. You may have this test every year starting at age 33. Flexible sigmoidoscopy or colonoscopy. You may have a sigmoidoscopy every 5 years or a colonoscopy every 10 years starting at age 45. Hepatitis C blood test. Hepatitis B blood test. Sexually transmitted disease (STD) testing. Diabetes screening. This is done by checking your blood sugar (glucose) after you have not eaten for  a while (fasting). You may have this done every 1-3 years. Bone density scan. This is done to screen for osteoporosis. You may have this done starting at age 68. Mammogram. This may be done every 1-2 years. Talk to your health care provider about how often you should have regular mammograms. Talk with your health care provider about your test results, treatment options, and if necessary, the need for more tests. Vaccines  Your health care provider may recommend certain vaccines, such as: Influenza vaccine. This is recommended every year. Tetanus, diphtheria, and acellular pertussis (Tdap, Td) vaccine. You may need a Td booster every 10 years. Zoster vaccine. You may need this after age 44. Pneumococcal 13-valent conjugate (PCV13) vaccine. One dose is recommended after age 61. Pneumococcal polysaccharide (PPSV23) vaccine. One dose is recommended after age  55. Talk to your health care provider about which screenings and vaccines you need and how often you need them. This information is not intended to replace advice given to you by your health care provider. Make sure you discuss any questions you have with your health care provider. Document Released: 02/10/2015 Document Revised: 10/04/2015 Document Reviewed: 11/15/2014 Elsevier Interactive Patient Education  2017 [employer].  Fall Prevention in the Home Falls can cause injuries. They can happen to people of all ages. There are many things you can do to make your home safe and to help prevent falls. What can I do on the outside of my home? Regularly fix the edges of walkways and driveways and fix any cracks. Remove anything that might make you trip as you walk through a door, such as a raised step or threshold. Trim any bushes or trees on the path to your home. Use bright outdoor lighting. Clear any walking paths of anything that might make someone trip, such as rocks or tools. Regularly check to see if handrails are loose or broken. Make sure that both sides of any steps have handrails. Any raised decks and porches should have guardrails on the edges. Have any leaves, snow, or ice cleared regularly. Use sand or salt on walking paths during winter. Clean up any spills in your garage right away. This includes oil or grease spills. What can I do in the bathroom? Use night lights. Install grab bars by the toilet and in the tub and shower. Do not use towel bars as grab bars. Use non-skid mats or decals in the tub or shower. If you need to sit down in the shower, use a plastic, non-slip stool. Keep the floor dry. Clean up any water that spills on the floor as soon as it happens. Remove soap buildup in the tub or shower regularly. Attach bath mats securely with double-sided non-slip rug tape. Do not have throw rugs and other things on the floor that can make you trip. What can I do in the  bedroom? Use night lights. Make sure that you have a light by your bed that is easy to reach. Do not use any sheets or blankets that are too big for your bed. They should not hang down onto the floor. Have a firm chair that has side arms. You can use this for support while you get dressed. Do not have throw rugs and other things on the floor that can make you trip. What can I do in the kitchen? Clean up any spills right away. Avoid walking on wet floors. Keep items that you use a lot in easy-to-reach places. If you need to reach something above  you, use a strong step stool that has a grab bar. Keep electrical cords out of the way. Do not use floor polish or wax that makes floors slippery. If you must use wax, use non-skid floor wax. Do not have throw rugs and other things on the floor that can make you trip. What can I do with my stairs? Do not leave any items on the stairs. Make sure that there are handrails on both sides of the stairs and use them. Fix handrails that are broken or loose. Make sure that handrails are as long as the stairways. Check any carpeting to make sure that it is firmly attached to the stairs. Fix any carpet that is loose or worn. Avoid having throw rugs at the top or bottom of the stairs. If you do have throw rugs, attach them to the floor with carpet tape. Make sure that you have a light switch at the top of the stairs and the bottom of the stairs. If you do not have them, ask someone to add them for you. What else can I do to help prevent falls? Wear shoes that: Do not have high heels. Have rubber bottoms. Are comfortable and fit you well. Are closed at the toe. Do not wear sandals. If you use a stepladder: Make sure that it is fully opened. Do not climb a closed stepladder. Make sure that both sides of the stepladder are locked into place. Ask someone to hold it for you, if possible. Clearly mark and make sure that you can see: Any grab bars or  handrails. First and last steps. Where the edge of each step is. Use tools that help you move around (mobility aids) if they are needed. These include: Canes. Walkers. Scooters. Crutches. Turn on the lights when you go into a dark area. Replace any light bulbs as soon as they burn out. Set up your furniture so you have a clear path. Avoid moving your furniture around. If any of your floors are uneven, fix them. If there are any pets around you, be aware of where they are. Review your medicines with your doctor. Some medicines can make you feel dizzy. This can increase your chance of falling. Ask your doctor what other things that you can do to help prevent falls. This information is not intended to replace advice given to you by your health care provider. Make sure you discuss any questions you have with your health care provider. Document Released: 11/10/2008 Document Revised: 06/22/2015 Document Reviewed: 02/18/2014 Elsevier Interactive Patient Education  2017 [employer].

## 2022-05-17 ENCOUNTER — Other Ambulatory Visit: Payer: Self-pay

## 2022-05-17 ENCOUNTER — Encounter: Payer: Self-pay | Admitting: Hematology and Oncology

## 2022-05-19 ENCOUNTER — Other Ambulatory Visit: Payer: Self-pay | Admitting: Nurse Practitioner

## 2022-05-19 DIAGNOSIS — K8689 Other specified diseases of pancreas: Secondary | ICD-10-CM

## 2022-05-20 ENCOUNTER — Ambulatory Visit: Payer: 59 | Admitting: Gastroenterology

## 2022-05-20 ENCOUNTER — Telehealth: Payer: Medicare Other

## 2022-05-22 ENCOUNTER — Ambulatory Visit (HOSPITAL_COMMUNITY)
Admission: RE | Admit: 2022-05-22 | Discharge: 2022-05-22 | Disposition: A | Discharge status: Home / Self Care | Payer: 59 | Source: Ambulatory Visit | Attending: Physician Assistant | Admitting: Physician Assistant

## 2022-05-22 DIAGNOSIS — N289 Disorder of kidney and ureter, unspecified: Secondary | ICD-10-CM | POA: Insufficient documentation

## 2022-05-22 DIAGNOSIS — N2889 Other specified disorders of kidney and ureter: Secondary | ICD-10-CM | POA: Diagnosis not present

## 2022-05-22 DIAGNOSIS — K5730 Diverticulosis of large intestine without perforation or abscess without bleeding: Secondary | ICD-10-CM | POA: Diagnosis not present

## 2022-05-22 MED ORDER — GADOBUTROL 1 MMOL/ML IV SOLN
7.5000 mL | Freq: Once | INTRAVENOUS | Status: AC | PRN
  Administered 2022-05-22: 7.5 mL via INTRAVENOUS

## 2022-05-22 NOTE — Progress Notes (Deleted)
Patient was scheduled for a MRI Abdomen wo/w today at 3pm, arrive at 2:20p. Patient was a No Show/No Call for her appointment. Her order will be placed back into Ancillary Orders to be rescheduled.

## 2022-05-23 ENCOUNTER — Other Ambulatory Visit: Payer: Self-pay | Admitting: Physician Assistant

## 2022-05-23 NOTE — Telephone Encounter (Signed)
Last OV: 04/15/22  Next OV: 09/05/22  Last Filled: 03/06/22  Quantity: 90 w/ 2 refills

## 2022-05-24 ENCOUNTER — Encounter: Payer: Self-pay | Admitting: Hematology and Oncology

## 2022-05-27 ENCOUNTER — Telehealth: Payer: Self-pay | Admitting: *Deleted

## 2022-05-27 ENCOUNTER — Other Ambulatory Visit: Payer: Self-pay | Admitting: Hematology and Oncology

## 2022-05-27 NOTE — Telephone Encounter (Signed)
-----   Message from Briant Cedar, PA-C sent at 05/27/2022 12:17 PM EDT ----- Can you notify patient that MRI results show the right kidney lesions is most consistent with benign cyst. No further workup is required.   ----- Message ----- From: Interface, Rad Results In Sent: 05/25/2022  10:27 PM EDT To: Briant Cedar, PA-C

## 2022-05-27 NOTE — Telephone Encounter (Signed)
TCT patient related to her recent MRI. Advised that MRI results show the right kidney lesions is most consistent with benign cyst. No further workup is required. Pt voiced understanding. She did have questions about possible early cirrhosis. Advised to speak with Georga Kaufmann, PA on her upcoming visit on 05/29/22. Also patient skipped treatment last time due to new skin rash. Pt states she feels better off treatment and is asking to skip another treatment this week. Advised to discuss this with Karena Addison on Wednesday. Pt voiced understanding. She verbalizes understanding that going without treatment increases the possibility of her cancer recurring.

## 2022-05-29 ENCOUNTER — Inpatient Hospital Stay: Payer: 59

## 2022-05-29 ENCOUNTER — Inpatient Hospital Stay: Payer: 59 | Attending: Hematology and Oncology

## 2022-05-29 ENCOUNTER — Inpatient Hospital Stay (HOSPITAL_BASED_OUTPATIENT_CLINIC_OR_DEPARTMENT_OTHER): Payer: 59 | Admitting: Physician Assistant

## 2022-05-29 VITALS — BP 136/75 | HR 89 | Temp 97.8°F | Resp 16 | Ht 62.5 in | Wt 197.2 lb

## 2022-05-29 VITALS — BP 120/74 | HR 84 | Resp 16

## 2022-05-29 DIAGNOSIS — Z811 Family history of alcohol abuse and dependence: Secondary | ICD-10-CM | POA: Insufficient documentation

## 2022-05-29 DIAGNOSIS — R600 Localized edema: Secondary | ICD-10-CM | POA: Diagnosis not present

## 2022-05-29 DIAGNOSIS — C3490 Malignant neoplasm of unspecified part of unspecified bronchus or lung: Secondary | ICD-10-CM

## 2022-05-29 DIAGNOSIS — R197 Diarrhea, unspecified: Secondary | ICD-10-CM | POA: Diagnosis not present

## 2022-05-29 DIAGNOSIS — I10 Essential (primary) hypertension: Secondary | ICD-10-CM | POA: Diagnosis not present

## 2022-05-29 DIAGNOSIS — E119 Type 2 diabetes mellitus without complications: Secondary | ICD-10-CM | POA: Insufficient documentation

## 2022-05-29 DIAGNOSIS — Z823 Family history of stroke: Secondary | ICD-10-CM | POA: Insufficient documentation

## 2022-05-29 DIAGNOSIS — F17210 Nicotine dependence, cigarettes, uncomplicated: Secondary | ICD-10-CM | POA: Diagnosis not present

## 2022-05-29 DIAGNOSIS — Z8249 Family history of ischemic heart disease and other diseases of the circulatory system: Secondary | ICD-10-CM | POA: Insufficient documentation

## 2022-05-29 DIAGNOSIS — Z9049 Acquired absence of other specified parts of digestive tract: Secondary | ICD-10-CM | POA: Insufficient documentation

## 2022-05-29 DIAGNOSIS — G8929 Other chronic pain: Secondary | ICD-10-CM | POA: Diagnosis not present

## 2022-05-29 DIAGNOSIS — R110 Nausea: Secondary | ICD-10-CM | POA: Diagnosis not present

## 2022-05-29 DIAGNOSIS — D3502 Benign neoplasm of left adrenal gland: Secondary | ICD-10-CM | POA: Insufficient documentation

## 2022-05-29 DIAGNOSIS — Z8261 Family history of arthritis: Secondary | ICD-10-CM | POA: Insufficient documentation

## 2022-05-29 DIAGNOSIS — K5730 Diverticulosis of large intestine without perforation or abscess without bleeding: Secondary | ICD-10-CM | POA: Diagnosis not present

## 2022-05-29 DIAGNOSIS — F419 Anxiety disorder, unspecified: Secondary | ICD-10-CM | POA: Diagnosis not present

## 2022-05-29 DIAGNOSIS — J449 Chronic obstructive pulmonary disease, unspecified: Secondary | ICD-10-CM | POA: Insufficient documentation

## 2022-05-29 DIAGNOSIS — Z79620 Long term (current) use of immunosuppressive biologic: Secondary | ICD-10-CM | POA: Diagnosis not present

## 2022-05-29 DIAGNOSIS — E785 Hyperlipidemia, unspecified: Secondary | ICD-10-CM | POA: Insufficient documentation

## 2022-05-29 DIAGNOSIS — Z820 Family history of epilepsy and other diseases of the nervous system: Secondary | ICD-10-CM | POA: Insufficient documentation

## 2022-05-29 DIAGNOSIS — R200 Anesthesia of skin: Secondary | ICD-10-CM | POA: Diagnosis not present

## 2022-05-29 DIAGNOSIS — Z882 Allergy status to sulfonamides status: Secondary | ICD-10-CM | POA: Insufficient documentation

## 2022-05-29 DIAGNOSIS — K219 Gastro-esophageal reflux disease without esophagitis: Secondary | ICD-10-CM | POA: Diagnosis not present

## 2022-05-29 DIAGNOSIS — G43809 Other migraine, not intractable, without status migrainosus: Secondary | ICD-10-CM

## 2022-05-29 DIAGNOSIS — E876 Hypokalemia: Secondary | ICD-10-CM | POA: Insufficient documentation

## 2022-05-29 DIAGNOSIS — K7460 Unspecified cirrhosis of liver: Secondary | ICD-10-CM | POA: Insufficient documentation

## 2022-05-29 DIAGNOSIS — Z5112 Encounter for antineoplastic immunotherapy: Secondary | ICD-10-CM | POA: Diagnosis not present

## 2022-05-29 DIAGNOSIS — G629 Polyneuropathy, unspecified: Secondary | ICD-10-CM | POA: Diagnosis not present

## 2022-05-29 DIAGNOSIS — Z79621 Long term (current) use of calcineurin inhibitor: Secondary | ICD-10-CM | POA: Diagnosis not present

## 2022-05-29 DIAGNOSIS — C3431 Malignant neoplasm of lower lobe, right bronchus or lung: Secondary | ICD-10-CM | POA: Diagnosis not present

## 2022-05-29 DIAGNOSIS — Z8719 Personal history of other diseases of the digestive system: Secondary | ICD-10-CM | POA: Insufficient documentation

## 2022-05-29 DIAGNOSIS — Z885 Allergy status to narcotic agent status: Secondary | ICD-10-CM | POA: Insufficient documentation

## 2022-05-29 DIAGNOSIS — Z886 Allergy status to analgesic agent status: Secondary | ICD-10-CM | POA: Insufficient documentation

## 2022-05-29 DIAGNOSIS — Z825 Family history of asthma and other chronic lower respiratory diseases: Secondary | ICD-10-CM | POA: Insufficient documentation

## 2022-05-29 DIAGNOSIS — Z90721 Acquired absence of ovaries, unilateral: Secondary | ICD-10-CM | POA: Insufficient documentation

## 2022-05-29 DIAGNOSIS — F32A Depression, unspecified: Secondary | ICD-10-CM | POA: Insufficient documentation

## 2022-05-29 DIAGNOSIS — K709 Alcoholic liver disease, unspecified: Secondary | ICD-10-CM | POA: Diagnosis not present

## 2022-05-29 DIAGNOSIS — Z95828 Presence of other vascular implants and grafts: Secondary | ICD-10-CM

## 2022-05-29 DIAGNOSIS — Z809 Family history of malignant neoplasm, unspecified: Secondary | ICD-10-CM | POA: Insufficient documentation

## 2022-05-29 DIAGNOSIS — Z79899 Other long term (current) drug therapy: Secondary | ICD-10-CM | POA: Insufficient documentation

## 2022-05-29 DIAGNOSIS — Z880 Allergy status to penicillin: Secondary | ICD-10-CM | POA: Insufficient documentation

## 2022-05-29 DIAGNOSIS — Z814 Family history of other substance abuse and dependence: Secondary | ICD-10-CM | POA: Insufficient documentation

## 2022-05-29 DIAGNOSIS — Z8616 Personal history of COVID-19: Secondary | ICD-10-CM | POA: Insufficient documentation

## 2022-05-29 DIAGNOSIS — Z888 Allergy status to other drugs, medicaments and biological substances status: Secondary | ICD-10-CM | POA: Insufficient documentation

## 2022-05-29 DIAGNOSIS — Z90710 Acquired absence of both cervix and uterus: Secondary | ICD-10-CM | POA: Insufficient documentation

## 2022-05-29 LAB — CBC WITH DIFFERENTIAL (CANCER CENTER ONLY)
Abs Immature Granulocytes: 0.02 10*3/uL (ref 0.00–0.07)
Basophils Absolute: 0.1 10*3/uL (ref 0.0–0.1)
Basophils Relative: 1 %
Eosinophils Absolute: 0.9 10*3/uL — ABNORMAL HIGH (ref 0.0–0.5)
Eosinophils Relative: 13 %
HCT: 38.5 % (ref 36.0–46.0)
Hemoglobin: 12.4 g/dL (ref 12.0–15.0)
Immature Granulocytes: 0 %
Lymphocytes Relative: 33 %
Lymphs Abs: 2.4 10*3/uL (ref 0.7–4.0)
MCH: 27.1 pg (ref 26.0–34.0)
MCHC: 32.2 g/dL (ref 30.0–36.0)
MCV: 84.1 fL (ref 80.0–100.0)
Monocytes Absolute: 0.7 10*3/uL (ref 0.1–1.0)
Monocytes Relative: 9 %
Neutro Abs: 3.3 10*3/uL (ref 1.7–7.7)
Neutrophils Relative %: 44 %
Platelet Count: 185 10*3/uL (ref 150–400)
RBC: 4.58 MIL/uL (ref 3.87–5.11)
RDW: 18.6 % — ABNORMAL HIGH (ref 11.5–15.5)
WBC Count: 7.4 10*3/uL (ref 4.0–10.5)
nRBC: 0 % (ref 0.0–0.2)

## 2022-05-29 LAB — CMP (CANCER CENTER ONLY)
ALT: 9 U/L (ref 0–44)
AST: 14 U/L — ABNORMAL LOW (ref 15–41)
Albumin: 3.7 g/dL (ref 3.5–5.0)
Alkaline Phosphatase: 71 U/L (ref 38–126)
Anion gap: 6 (ref 5–15)
BUN: 9 mg/dL (ref 8–23)
CO2: 30 mmol/L (ref 22–32)
Calcium: 8.5 mg/dL — ABNORMAL LOW (ref 8.9–10.3)
Chloride: 98 mmol/L (ref 98–111)
Creatinine: 0.94 mg/dL (ref 0.44–1.00)
GFR, Estimated: >60 mL/min (ref >60)
Glucose, Bld: 95 mg/dL (ref 70–99)
Potassium: 3.6 mmol/L (ref 3.5–5.1)
Sodium: 134 mmol/L — ABNORMAL LOW (ref 135–145)
Total Bilirubin: 0.4 mg/dL (ref 0.3–1.2)
Total Protein: 6.3 g/dL — ABNORMAL LOW (ref 6.5–8.1)

## 2022-05-29 LAB — TSH: TSH: 0.97 u[IU]/mL (ref 0.350–4.500)

## 2022-05-29 MED ORDER — ONDANSETRON HCL 4 MG/2ML IJ SOLN
8.0000 mg | Freq: Once | INTRAMUSCULAR | Status: AC
  Administered 2022-05-29: 8 mg via INTRAVENOUS
  Filled 2022-05-29: qty 4

## 2022-05-29 MED ORDER — SODIUM CHLORIDE 0.9 % IV SOLN
1200.0000 mg | Freq: Once | INTRAVENOUS | Status: AC
  Administered 2022-05-29: 1200 mg via INTRAVENOUS
  Filled 2022-05-29: qty 20

## 2022-05-29 MED ORDER — SODIUM CHLORIDE 0.9% FLUSH
10.0000 mL | Freq: Once | INTRAVENOUS | Status: AC
  Administered 2022-05-29: 10 mL

## 2022-05-29 MED ORDER — SODIUM CHLORIDE 0.9 % IV SOLN: Freq: Once | INTRAVENOUS | Status: AC

## 2022-05-29 MED ORDER — SODIUM CHLORIDE 0.9% FLUSH
10.0000 mL | INTRAVENOUS | Status: DC | PRN
  Administered 2022-05-29: 10 mL

## 2022-05-29 MED ORDER — MORPHINE SULFATE (PF) 2 MG/ML IV SOLN
1.0000 mg | Freq: Once | INTRAVENOUS | Status: AC
  Administered 2022-05-29: 1 mg via INTRAVENOUS
  Filled 2022-05-29: qty 1

## 2022-05-29 MED ORDER — HEPARIN SOD (PORK) LOCK FLUSH 100 UNIT/ML IV SOLN
500.0000 [IU] | Freq: Once | INTRAVENOUS | Status: AC | PRN
  Administered 2022-05-29: 500 [IU]

## 2022-05-29 NOTE — Patient Instructions (Signed)
[facility]  Discharge Instructions: Thank you for choosing [facility] to provide your oncology and hematology care.   If you have a lab appointment with the Cancer Center, please go directly to the Cancer Center and check in at the registration area.   Wear comfortable clothing and clothing appropriate for easy access to any Portacath or PICC line.   We strive to give you quality time with your provider. You may need to reschedule your appointment if you arrive late (15 or more minutes).  Arriving late affects you and other patients whose appointments are after yours.  Also, if you miss three or more appointments without notifying the office, you may be dismissed from the clinic at the provider's discretion.      For prescription refill requests, have your pharmacy contact our office and allow 72 hours for refills to be completed.    Today you received the following chemotherapy and/or immunotherapy agents Tecentriq      To help prevent nausea and vomiting after your treatment, we encourage you to take your nausea medication as directed.  BELOW ARE SYMPTOMS THAT SHOULD BE REPORTED IMMEDIATELY: *FEVER GREATER THAN 100.4 F (38 C) OR HIGHER *CHILLS OR SWEATING *NAUSEA AND VOMITING THAT IS NOT CONTROLLED WITH YOUR NAUSEA MEDICATION *UNUSUAL SHORTNESS OF BREATH *UNUSUAL BRUISING OR BLEEDING *URINARY PROBLEMS (pain or burning when urinating, or frequent urination) *BOWEL PROBLEMS (unusual diarrhea, constipation, pain near the anus) TENDERNESS IN MOUTH AND THROAT WITH OR WITHOUT PRESENCE OF ULCERS (sore throat, sores in mouth, or a toothache) UNUSUAL RASH, SWELLING OR PAIN  UNUSUAL VAGINAL DISCHARGE OR ITCHING   Items with * indicate a potential emergency and should be followed up as soon as possible or go to the Emergency Department if any problems should occur.  Please show the CHEMOTHERAPY ALERT CARD or IMMUNOTHERAPY ALERT CARD at  check-in to the Emergency Department and triage nurse.  Should you have questions after your visit or need to cancel or reschedule your appointment, please contact [facility]  Dept: [phone]  and follow the prompts.  Office hours are 8:00 a.m. to 4:30 p.m. Monday - Friday. Please note that voicemails left after 4:00 p.m. may not be returned until the following business day.  We are closed weekends and major holidays. You have access to a nurse at all times for urgent questions. Please call the main number to the clinic Dept: [phone] and follow the prompts.   For any non-urgent questions, you may also contact your provider using MyChart. We now offer e-Visits for anyone 18 and older to request care online for non-urgent symptoms. For details visit [email or web address].   Also download the MyChart app! Go to the app store, search "MyChart", open the app, select [facility], and log in with your MyChart username and password. 

## 2022-05-29 NOTE — Progress Notes (Signed)
[name]   [phone]  PROGRESS NOTE  Patient Care Team: Allwardt, Crist Infante, PA-C as PCP - General (Physician Assistant) Kendra Fee, [name] as Attending Physician (Gastroenterology) Dr. Louie Bun, [name] as Consulting Physician (Orthopedic Surgery) Kendra Gosselin, [name] as Consulting Physician (Orthopedic Surgery) Kendra Spaniel, [name] (Inactive) as Consulting Physician (Neurology) [name], [name] as Consulting Physician (Optometry) Kendra Jarvis Lesle Chris, [name] as Consulting Physician (Neurology) [name], [facility] (Pharmacist)  Hematological/Oncological History # Small Cell Lung Cancer, Extensive Stage 07/05/2020: CT abdomen for lower abdominal pain. New pulmonary nodular density noted 07/06/2020: CT chest showed 2.2 cm macrolobulated right lower lobe pulmonary nodule (favored) versus pathologically enlarged infrahilar lymph node 10/13/2020: PET CT scan performed, findings show 2 cm right lower lobe lung mass is hypermetabolic and consistent with primary lung neoplasm. Additionally found hypermetabolic 17 mm soft tissue lesion between the descending duodenum and the pancreatic head  10/19/2020: EGD to biopsy hypermetabolic lymph node. Biopsy results show small cell lung cancer 10/26/2020: establish care with Dr. Leonides Schanz  11/13/2020: Cycle 1 Day 1 of Carbo/Etop/Atezolizumab 12/04/2020: Cycle 2 Day 1 of Carbo/Etop/Atezolizumab 11/22-11/25/2022: admitted for E. Coli bacteremia/sepsis. Start of Cycle 3 delayed. 01/02/2021: Cycle 3 Day 1 of Carbo/Etop/Atezolizumab 01/23/2021: Cycle 4 Day 1 of Carbo/Etop/Atezolizumab 02/22/2021: Cycle 5 Day 1 of Atezolizumab Maintenance. Delayed start due to patient's COVID infection.  03/14/2021: Cycle 6 Day 1 of Atezolizumab Maintenance 04/05/2021: Cycle 7 Day 1 of Atezolizumab Maintenance 04/25/2021: Cycle 8 Day 1 of Atezolizumab Maintenance 05/16/2021: Cycle 9 Day 1 of Atezolizumab Maintenance 06/06/2021:  Cycle 10 Day 1 of Atezolizumab Maintenance 06/27/2021:  Cycle 11 Day 1 of Atezolizumab Maintenance 07/18/2021: Cycle 12 Day 1 of Atezolizumab Maintenance 08/08/2021: Cycle 13 Day 1 of Atezolizumab Maintenance 08/29/2021: Cycle 14 Day 1 of Atezolizumab Maintenance 09/19/2021: Cycle 15 Day 1 of Atezolizumab Maintenance 10/10/2021: Cycle 16 Day 1 of Atezolizumab Maintenance 10/31/2021: Cycle 17 Day 1 of Atezolizumab Maintenance 11/21/2021: Cycle 18 Day 1 of Atezolizumab Maintenance 12/12/2021: Cycle 19 Day 1 of Atezolizumab Maintenance 01/02/2022: Cycle 20 Day 1 of Atezolizumab Maintenance 01/23/2022: Cycle 21 Day 1 of Atezolizumab Maintenance 02/13/2022: treatment HELD due to diarrhea.  03/06/2022: Cycle 22 Day 1 of Atezolizumab Maintenance 03/27/2022: Cycle 23 Day 1 of Atezolizumab Maintenance 04/17/2022: Cycle 24 Day 1 of Atezolizumab Maintenance 05/08/2022:  Cycle 25 Day 1 of Atezolizumab Maintenance (Held due to patient preference for treatment holiday) 05/29/2022: Cycle 25 Day 1 of Atezolizumab Maintenance   Interval History:  [name] 67 y.o. female with medical history significant for extensive stage small cell lung cancer who presents for a follow up visit. The patient's last visit was on 05/08/2022.  She presents today to start cycle 25 of chemotherapy.  On exam today [name] reports having a bad migraine today since the morning. She denies any dizziness or vision changes. She reports her energy levels did improve with the treatment holiday. She denies nausea, vomiting or abdominal pain. Her diarrhea is well controlled with creon. She reports her itching has improved with the prescribed  triamcinolone cream and tacrolimus. Her shortness of breath is stable and mainly with exertion. She denies fevers, chills, sweats, chest pain or cough. She has no other complaints.A full 10 point ROS is listed below.   MEDICAL HISTORY:  Past Medical History:  Diagnosis Date   Allergic rhinitis     Anemia    Anxiety    Chicken pox    Chronic back pain  COPD (chronic obstructive pulmonary disease) (HCC)    Depression    DM (diabetes mellitus) (HCC)    Essential hypertension    GERD (gastroesophageal reflux disease)    Headache    migraines   History of gastritis    EGD 2015   History of home oxygen therapy    2 liters at hs last 6 months   Hyperlipidemia    Hypothyroidism    Migraines    Osteoarthritis    oa   Scoliosis     SURGICAL HISTORY: Past Surgical History:  Procedure Laterality Date   APPENDECTOMY     1985   BIOPSY  07/24/2017   Procedure: BIOPSY;  Surgeon: Kendra Fee, [name];  Location: [facility];  Service: Endoscopy;;   CARDIAC CATHETERIZATION N/A 10/31/2015   Procedure: Left Heart Cath and Coronary Angiography;  Surgeon: Marykay Lex, [name];  Location: [facility] INVASIVE CV LAB;  Service: Cardiovascular;  Laterality: N/A;   CARPAL TUNNEL RELEASE Left    CARPAL TUNNEL RELEASE Right    CHOLECYSTECTOMY  late 1980's   COLONOSCOPY WITH PROPOFOL N/A 07/24/2017   Procedure: COLONOSCOPY WITH PROPOFOL;  Surgeon: Kendra Fee, [name];  Location: [facility];  Service: Endoscopy;  Laterality: N/A;   ESOPHAGOGASTRODUODENOSCOPY N/A 07/24/2017   Procedure: ESOPHAGOGASTRODUODENOSCOPY (EGD);  Surgeon: Kendra Fee, [name];  Location: [facility];  Service: Endoscopy;  Laterality: N/A;   ESOPHAGOGASTRODUODENOSCOPY (EGD) WITH PROPOFOL N/A 10/19/2020   Procedure: ESOPHAGOGASTRODUODENOSCOPY (EGD) WITH PROPOFOL;  Surgeon: Kendra Fee, [name];  Location: [facility];  Service: Endoscopy;  Laterality: N/A;   FINE NEEDLE ASPIRATION N/A 10/19/2020   Procedure: FINE NEEDLE ASPIRATION (FNA) LINEAR;  Surgeon: Kendra Fee, [name];  Location: [facility];  Service: Endoscopy;  Laterality: N/A;   GALLBLADDER SURGERY  1991   HIP CLOSED REDUCTION Right 01/08/2016   Procedure: CLOSED MANIPULATION HIP;  Surgeon: Jene Every, [name];  Location: [facility];  Service: Orthopedics;  Laterality:  Right;   HIP CLOSED REDUCTION Right 01/19/2016   Procedure: ATTEMPTED CLOSED REDUCTION RIGHT HIP;  Surgeon: Toni Arthurs, [name];  Location: [facility];  Service: Orthopedics;  Laterality: Right;   HIP CLOSED REDUCTION Right 01/20/2016   Procedure: CLOSED REDUCTION RIGHT TOTAL HIP;  Surgeon: Durene Romans, [name];  Location: [facility];  Service: Orthopedics;  Laterality: Right;   HIP CLOSED REDUCTION Right 02/17/2016   Procedure: CLOSED REDUCTION RIGHT TOTAL HIP;  Surgeon: Samson Frederic, [name];  Location: [facility];  Service: Orthopedics;  Laterality: Right;   HIP CLOSED REDUCTION Right 02/28/2016   Procedure: CLOSED REDUCTION HIP;  Surgeon: Yolonda Kida, [name];  Location: [facility];  Service: Orthopedics;  Laterality: Right;   IR IMAGING GUIDED PORT INSERTION  11/01/2020   POLYPECTOMY  07/24/2017   Procedure: POLYPECTOMY;  Surgeon: Kendra Fee, [name];  Location: [facility];  Service: Endoscopy;;   TONSILLECTOMY     TOTAL ABDOMINAL HYSTERECTOMY     1985, with 1 ovary removed and 2 nd ovary removed 2003   TOTAL HIP ARTHROPLASTY Right    Original surgery 2006 with revision 2010   TOTAL HIP REVISION Right 01/01/2016   Procedure: TOTAL HIP REVISION;  Surgeon: Durene Romans, [name];  Location: [facility];  Service: Orthopedics;  Laterality: Right;   TOTAL HIP REVISION Right 03/02/2016   Procedure: TOTAL HIP REVISION;  Surgeon: Durene Romans, [name];  Location: [facility];  Service: Orthopedics;  Laterality: Right;   TOTAL HIP REVISION Right 09/02/2016   Procedure: Right hip constrained liner- posterior;  Surgeon: Durene Romans, [name];  Location: [facility];  Service: Orthopedics;  Laterality: Right;   ULNAR NERVE TRANSPOSITION Right    UPPER ESOPHAGEAL ENDOSCOPIC ULTRASOUND (EUS) N/A 10/19/2020   Procedure: UPPER ESOPHAGEAL ENDOSCOPIC ULTRASOUND (EUS);  Surgeon: Kendra Fee, [name];  Location: [facility];  Service: Endoscopy;  Laterality: N/A;  periduodenal lesion    SOCIAL HISTORY: Social History   Socioeconomic History   Marital  status: Married    Spouse name: Not on file   Number of children: 2   Years of education: Not on file   Highest education level: Not on file  Occupational History   Occupation: disabled   Occupation: disabled  Tobacco Use   Smoking status: Every Day    Packs/day: 2.00    Years: 46.00    Additional pack years: 0.00    Total pack years: 92.00    Types: Cigarettes   Smokeless tobacco: Never   Tobacco comments:    2 packs of cigarettes smoked daily 12/13/21- declines smoking cessation  Vaping Use   Vaping Use: Never used  Substance and Sexual Activity   Alcohol use: No   Drug use: No   Sexual activity: Not Currently    Partners: Male  Other Topics Concern   Not on file  Social History Narrative   Right handed    Caffeine~ 2 cups per day    Lives at home with husband (strained relationship)   Primary caretaker for disabled brother who had aneurism   Daughter died 07-03-18   Social Determinants of Health   Financial Resource Strain: Low Risk  (05/16/2022)   Overall Financial Resource Strain (CARDIA)    Difficulty of Paying Living Expenses: Not hard at all  Food Insecurity: No Food Insecurity (05/16/2022)   Hunger Vital Sign    Worried About Running Out of Food in the Last Year: Never true    Ran Out of Food in the Last Year: Never true  Transportation Needs: No Transportation Needs (05/16/2022)   PRAPARE - Administrator, Civil Service (Medical): No    Lack of Transportation (Non-Medical): No  Physical Activity: Inactive (05/16/2022)   Exercise Vital Sign    Days of Exercise per Week: 0 days    Minutes of Exercise per Session: 0 min  Stress: Stress Concern Present (05/16/2022)   [employer] of Occupational Health - Occupational Stress Questionnaire    Feeling of Stress : To some extent  Social Connections: Moderately Isolated (05/16/2022)   Social Connection and Isolation Panel [NHANES]    Frequency of Communication with Friends and Family: More than  three times a week    Frequency of Social Gatherings with Friends and Family: Once a week    Attends Religious Services: Never    Database administrator or Organizations: No    Attends Banker Meetings: Never    Marital Status: Married  Catering manager Violence: Not At Risk (05/16/2022)   Humiliation, Afraid, Rape, and Kick questionnaire    Fear of Current or Ex-Partner: No    Emotionally Abused: No    Physically Abused: No    Sexually Abused: No    FAMILY HISTORY: Family History  Problem Relation Age of Onset   COPD Mother    Heart disease Mother    Lung disease Father        Asbestosis   Heart attack Father    Heart disease Father    Cerebral aneurysm Brother    Aneurysm Brother  Brain   Drug abuse Daughter    Epilepsy Son    Alcohol abuse Son    Drug abuse Son    Arthritis Maternal Grandmother    Heart disease Maternal Grandmother    Asthma Maternal Grandfather    Cancer Maternal Grandfather    Arthritis Paternal Grandmother    Heart disease Paternal Grandmother    Stroke Paternal Grandmother    Early death Paternal Grandfather    Heart disease Paternal Grandfather     ALLERGIES:  is allergic to metformin and related, nsaids, wellbutrin [bupropion], aleve [naproxen sodium], codeine, penicillins, and sulfonamide derivatives.  MEDICATIONS:  Current Outpatient Medications  Medication Sig Dispense Refill   albuterol (PROAIR HFA) 108 (90 Base) MCG/ACT inhaler 2 puffs every 4 hours as needed only  if your can't catch your breath 18 g 3   ALPRAZolam (XANAX) 1 MG tablet TAKE 1 TABLET BY MOUTH THREE TIMES DAILY AS NEEDED FOR ANXIETY 90 tablet 2   atorvastatin (LIPITOR) 20 MG tablet TAKE 1 TABLET BY MOUTH AT BEDTIME 90 tablet 1   benzonatate (TESSALON) 100 MG capsule Take 1 capsule (100 mg total) by mouth 3 (three) times daily. 90 capsule 1   Budeson-Glycopyrrol-Formoterol (BREZTRI AEROSPHERE) 160-9-4.8 MCG/ACT AERO Inhale 2 puffs into the lungs in the  morning and at bedtime. 10.7 g 3   CREON 36000-114000 units CPEP capsule TAKE TWO CAPSULES BY MOUTH THREE TIMES DAILY WITH MEALS 180 capsule 1   cyclobenzaprine (FLEXERIL) 10 MG tablet Take 10 mg by mouth 3 (three) times daily as needed.     dicyclomine (BENTYL) 10 MG capsule TAKE ONE CAPSULE BY MOUTH TWICE DAILY 60 capsule 1   diphenoxylate-atropine (LOMOTIL) 2.5-0.025 MG tablet Take 1 tablet by mouth 4 (four) times daily as needed for diarrhea or loose stools. 60 tablet 0   DULoxetine (CYMBALTA) 60 MG capsule Take 2 capsules (120 mg total) by mouth daily. 180 capsule 1   fluticasone (FLONASE) 50 MCG/ACT nasal spray INSTILL 2 SPRAYS IN EACH NOSTRIL EVERY DAY 16 g 6   furosemide (LASIX) 20 MG tablet TAKE 1 TABLET BY MOUTH TWICE DAILY, IF SWELLING IMPROVES CAN HOLD MEDICATION 90 tablet 0   guaiFENesin-codeine 100-10 MG/5ML syrup Take 5 mLs by mouth every 6 (six) hours as needed for cough. 120 mL 0   hydrOXYzine (ATARAX) 10 MG tablet Take 1 tablet (10 mg total) by mouth 3 (three) times daily as needed for itching. 30 tablet 0   levothyroxine (SYNTHROID) 137 MCG tablet Take 1 tablet (137 mcg total) by mouth daily before breakfast. 90 tablet 1   loratadine (CLARITIN) 10 MG tablet Take 10 mg by mouth daily.     meloxicam (MOBIC) 15 MG tablet TAKE 1 TABLET BY MOUTH EVERY DAY 30 tablet 2   OLANZapine (ZYPREXA) 10 MG tablet Take 1 tablet (10 mg total) by mouth at bedtime. 30 tablet 2   omeprazole (PRILOSEC) 40 MG capsule TAKE ONE CAPSULE BY MOUTH TWICE DAILY 180 capsule 3   ondansetron (ZOFRAN) 8 MG tablet TAKE 1 TABLET BY MOUTH TWICE DAILY 60 tablet 1   Oxycodone HCl 10 MG TABS Take 1 tablet (10 mg total) by mouth every 4 (four) hours as needed. Goal - to replace Norco- max 5 tabs/day 150 tablet 0   potassium chloride SA (KLOR-CON M) 20 MEQ tablet TAKE 1 TABLET BY MOUTH TWICE DAILY 60 tablet 2   prochlorperazine (COMPAZINE) 10 MG tablet Take 1 tablet (10 mg total) by mouth every 6 (six) hours as needed  for nausea or vomiting. 30 tablet 0   tacrolimus (PROTOPIC) 0.1 % ointment Apply topically 2 (two) times daily. APPLY TO ITCHY AREAS TWICE A DAY FOR UP TO TWO WEEKS. ALTERNATE WITH TRIAMCINOLONE CREAM 100 g 0   triamcinolone ointment (KENALOG) 0.5 % Apply 1 Application topically 3 (three) times daily. Apply thin-layer on affected areas, no more than 2 weeks consistently. 60 g 2   triamterene-hydrochlorothiazide (MAXZIDE-25) 37.5-25 MG tablet TAKE 1 TABLET BY MOUTH DAILY 90 tablet 2   No current facility-administered medications for this visit.    REVIEW OF SYSTEMS:   Constitutional: ( - ) fevers, ( - )  chills , ( - ) night sweats Eyes: ( - ) blurriness of vision, ( - ) double vision, ( - ) watery eyes Ears, nose, mouth, throat, and face: ( - ) mucositis, ( - ) sore throat Respiratory: ( - ) cough, ( -) dyspnea, ( - ) wheezes Cardiovascular: ( - ) palpitation, ( - ) chest discomfort, ( - ) lower extremity swelling Gastrointestinal:  ( +) nausea, ( - ) heartburn, ( - ) change in bowel habits Skin: ( - ) abnormal skin rashes Lymphatics: ( - ) new lymphadenopathy, ( - ) easy bruising Neurological: (+ ) numbness, ( - ) tingling, ( - ) new weaknesses Behavioral/Psych: ( - ) mood change, ( - ) new changes  All other systems were reviewed with the patient and are negative.  PHYSICAL EXAMINATION: ECOG PERFORMANCE STATUS: 1 - Symptomatic but completely ambulatory  Vitals:   05/29/22 1110  BP: 136/75  Pulse: 89  Resp: 16  Temp: 97.8 F (36.6 C)  SpO2: 94%    Filed Weights   05/29/22 1110  Weight: 197 lb 3.2 oz (89.4 kg)     GENERAL: Well-appearing middle-age Caucasian female, alert, no distress and comfortable SKIN: skin color, texture, turgor are normal, no rashes or significant lesions EYES: conjunctiva are pink and non-injected, sclera clear LUNGS:  normal breathing effort. Diffuse wheezing hear on ausculation.  HEART: regular rate & rhythm and no murmurs. Mild bilateral lower  extremity edema Musculoskeletal: no cyanosis of digits and no clubbing  PSYCH: alert & oriented x 3, fluent speech NEURO: no focal motor/sensory deficits  LABORATORY DATA:  I have reviewed the data as listed    Latest Ref Rng & Units 05/29/2022   10:41 AM 05/08/2022    9:58 AM 04/17/2022    1:57 PM  CBC  WBC 4.0 - 10.5 K/uL 7.4  8.5  8.0   Hemoglobin 12.0 - 15.0 g/dL 16.1  09.6  04.5   Hematocrit 36.0 - 46.0 % 38.5  37.0  36.2   Platelets 150 - 400 K/uL 185  169  161        Latest Ref Rng & Units 05/29/2022   10:41 AM 05/08/2022    9:58 AM 04/17/2022    1:57 PM  CMP  Glucose 70 - 99 mg/dL 95  92  89   [name] 8 - 23 mg/dL 9  9  7    Creatinine 0.44 - 1.00 mg/dL 4.09  8.11  9.14   Sodium 135 - 145 mmol/L 134  135  133   Potassium 3.5 - 5.1 mmol/L 3.6  3.8  3.7   Chloride 98 - 111 mmol/L 98  98  100   CO2 22 - 32 mmol/L 30  30  27    Calcium 8.9 - 10.3 mg/dL 8.5  8.7  8.1   Total Protein 6.5 - 8.1 g/dL  6.3  6.4  5.9   Total Bilirubin 0.3 - 1.2 mg/dL 0.4  0.3  0.5   Alkaline Phos 38 - 126 U/L 71  64  59   AST 15 - 41 U/L 14  13  13    ALT 0 - 44 U/L 9  7  10      No results found for: "MPROTEIN" Lab Results  Component Value Date   KPAFRELGTCHN 0.75 06/30/2014   LAMBDASER 3.78 (H) 06/30/2014   KAPLAMBRATIO 0.20 (L) 06/30/2014     RADIOGRAPHIC STUDIES: MR Abdomen W Wo Contrast  Result Date: 05/25/2022 CLINICAL DATA:  Evaluate renal lesion identified by prior CT EXAM: MRI ABDOMEN WITHOUT AND WITH CONTRAST TECHNIQUE: Multiplanar multisequence MR imaging of the abdomen was performed both before and after the administration of intravenous contrast. CONTRAST:  7.72mL GADAVIST GADOBUTROL 1 MMOL/ML IV SOLN COMPARISON:  CT chest abdomen pelvis, 04/26/2022 FINDINGS: Lower chest: No acute abnormality. Hepatobiliary: No focal liver abnormality is seen. Minimally coarse contour of the liver. Hepatic signal inversion on in and opposed phase imaging as well as intrinsic T2 hypointensity consistent  with hepatic iron deposition. Status post cholecystectomy. Unchanged postoperative biliary dilatation. Pancreas: Unremarkable. No pancreatic ductal dilatation or surrounding inflammatory changes. Spleen: Normal in size without significant abnormality. Adrenals/Urinary Tract: Small definitively benign macroscopic fat containing left adrenal adenoma, for which no further follow-up or characterization is required. Normal right adrenal gland. Intrinsically T1 hyperintense lesion of the lateral midportion of the right kidney measuring 1.5 x 1.4 cm with no associated contrast enhancement (series 9, image 37). Kidneys are otherwise normal, without obvious renal calculi, solid lesion, or hydronephrosis. Stomach/Bowel: Stomach is within normal limits. No evidence of bowel wall thickening, distention, or inflammatory changes. Descending and sigmoid diverticulosis. Vascular/Lymphatic: No significant vascular findings are present. No enlarged abdominal lymph nodes. Other: No abdominal wall hernia or abnormality. No ascites. Musculoskeletal: No acute or significant osseous findings. IMPRESSION: 1. Lesion of the lateral midportion of the right kidney queried by prior CT is consistent with a definitively benign hemorrhagic or proteinaceous cyst. No further follow-up or characterization is required for this benign lesion. 2. Minimally coarse contour of the liver with evidence of hepatic iron deposition. Findings may reflect early cirrhosis. No focal liver lesion. 3. Status post cholecystectomy. Unchanged postoperative biliary dilatation. 4. Diverticulosis without evidence of acute diverticulitis. Electronically Signed   By: Jearld Lesch M.D.   On: 05/25/2022 22:25    ASSESSMENT & PLAN [name] 67 y.o. female with medical history significant for extensive stage small cell lung cancer who presents for a follow up visit.   After review of the labs, review of the records, and discussion with the patient the patients  findings are most consistent with extensive stage small cell lung cancer with metastasis from the right lower lobe to the lymph nodes of the abdomen.  At this time we will pursue triple therapy with carboplatin, etoposide, and atezolizumab.  After 4 cycles we will convert to maintenance atezolizumab alone.  We previously discussed the risks and benefits of this therapy and the patient was in agreement to proceed with this treatment.  The treatment of choice consist of carboplatin, etoposide, and atezolizumab.  The regimen consists of carboplatin AUC of 5 IV on day 1, etoposide 100 mg per metered squared IV on day 1, 2, and 3 and atezolizumab 1200 mg on day 1.  This continues for 21-day cycles.  After 4 cycles the patient proceeds with atezolizumab maintenance therapy alone.    #  Small Cell Lung Cancer, Extensive Stage -- MRI of the brain shows no evidence of intracranial spread --Findings are currently consistent with metastatic small cell lung cancer with metastatic spread to the lymph nodes of the abdomen -- Started carboplatin, etoposide, and atezolizumab on 11/13/2020. Transitioned to maintenance Atezolizumab on 02/22/2021.  -- Most recent CT CAP from 04/26/2022 which shows no evidence of recurrence.  Plan: --today is Cycle 25 Day 1 of Atezolizumab maintenance  --labs today were reviewed and adequate for treatment. Hgb 12.4, white blood cell 7.4, MCV 84.1, and platelets of 185 --no prohibitive toxicities identified.Discussed risks versus benefits for continued treatment holiday and our recommendations to resume Atezolizumab to continue to maintain her excellent treatment response. Patient has agreed to move forward with treatment today --RTC in 3 weeks for a follow up before Cycle 26.   #Migraine: --Gave 1 mg IV morphine with her treatment today with improvement of symptoms.   #Right renal lesion: --Etiology unknown but recent CT scan from 04/26/2022 shows slow interval growth.  --We will order  an MRI abdomen from 05/22/2022 found lesion to be a benign hemorrhagic or proteinaceous cyst which requires no further workup.   #Liver nodularity: --seen on CT scan from 04/26/2022 and MR abdomen from 05/22/2022. --concerning for cirrhosis. --we will request GI to further evaluate. She has an upcoming visit on 06/07/2022.   #Diarrhea-improved --stool sent for C. Diff and GI pathology panel, all negative --patient following with GI and found to have pancreatic insufficiency and started on creon with improvement of symptoms.   #Nausea: --Symptoms improved with zofran and olanzapine.  --Added IV zofran to infusion premeds for better symptom control.   #Hypokalemia:  --currently taking potassium chloride 20 meq BID --potassium level is still 3.6 today. Continue PO supplementation.   #Lower extremity edema --Currently takes lasix 20 mg 1-2 times a day. --Stable. Continue to monitor.   #Neuropathy involving fingers/feet: --Patient currently takes Cymbalta   #Supportive Care -- chemotherapy education complete -- port placed -- zofran 8mg  q8H PRN and compazine 10mg  PO q6H for nausea -- EMLA cream for port  No orders of the defined types were placed in this encounter.  All questions were answered. The patient knows to call the clinic with any problems, questions or concerns.  I have spent a total of 30 minutes minutes of face-to-face and non-face-to-face time, preparing to see the patient, performing a medically appropriate examination, counseling and educating the patient, ordering medications/tests, documenting clinical information in the electronic health record, and care coordination.   Georga Kaufmann PA-C Dept of Hematology and Oncology [facility] at [facility] Phone: [phone]   05/29/2022 9:26 PM

## 2022-05-31 LAB — T4: T4, Total: 9.7 ug/dL (ref 4.5–12.0)

## 2022-06-05 ENCOUNTER — Encounter: Payer: Self-pay | Admitting: Physical Medicine and Rehabilitation

## 2022-06-05 ENCOUNTER — Encounter: Payer: 59 | Attending: Physical Medicine and Rehabilitation | Admitting: Physical Medicine and Rehabilitation

## 2022-06-05 ENCOUNTER — Telehealth: Payer: Self-pay

## 2022-06-05 VITALS — BP 132/77 | HR 89 | Ht 62.5 in | Wt 195.6 lb

## 2022-06-05 DIAGNOSIS — G43E19 Chronic migraine with aura, intractable, without status migrainosus: Secondary | ICD-10-CM | POA: Diagnosis not present

## 2022-06-05 DIAGNOSIS — Z5181 Encounter for therapeutic drug level monitoring: Secondary | ICD-10-CM

## 2022-06-05 DIAGNOSIS — G894 Chronic pain syndrome: Secondary | ICD-10-CM | POA: Insufficient documentation

## 2022-06-05 DIAGNOSIS — Z79891 Long term (current) use of opiate analgesic: Secondary | ICD-10-CM | POA: Diagnosis not present

## 2022-06-05 MED ORDER — TOPIRAMATE 25 MG PO TABS: 25.0000 mg | ORAL_TABLET | Freq: Every day | ORAL | 5 refills | Status: DC

## 2022-06-05 MED ORDER — HYDROCODONE-ACETAMINOPHEN 10-325 MG PO TABS: 1.0000 | ORAL_TABLET | ORAL | 0 refills | Status: DC | PRN

## 2022-06-05 MED ORDER — NURTEC 75 MG PO TBDP: 75.0000 mg | ORAL_TABLET | Freq: Every day | ORAL | 5 refills | Status: DC | PRN

## 2022-06-05 NOTE — Progress Notes (Signed)
Subjective:    Patient ID: [name], female    DOB: [birth year], 67 y.o.   MRN: [id number]  HPI Pt is a 67 yr old female with hx of newly dx'd COPD, still smoking, HTN, DM- diet controlled; anxiety and depression;  Scoliosis and deg back issues,  Generalized OA;  chronic pain- here for f/u of chronic pain issues. With new 2.2 cm R lower pulmonary nodule and hemorrhoids.  Now has dx of Small cell lung cancer extensive stage per oncology note. Here for f/u on chronic pain.    Having a lot of migraines lately.  When went to get Immunotherapy, had a doozy of migraines.  Gets light and sound sensitivity and nausea.   Also R hand goes numb, when gets nausea    Having migraines 3-4x/week lately.    Has a new rash- like water blisters on shoulders, down back and in waistline- -itch so bad. Also in hair.  Has talked to Oncology PCP told her she had scabies- saw Derm-  Scabies doesn't e tin hair- put on 2 types of creams- and alternate- sees them back 5/16.  Not going away, but got better, but had treatment- immunotherapy, likely the cause.    Started Creon for chronic diarrhea.    Pain Inventory Average Pain 7 Pain Right Now 6 My pain is sharp, stabbing, tingling, and aching  In the last 24 hours, has pain interfered with the following? General activity 10 Relation with others 9 Enjoyment of life 10 What TIME of day is your pain at its worst? morning  Sleep (in general) Fair  Pain is worse with: walking, bending, standing, and some activites Pain improves with: rest and medication Relief from Meds: 7  Family History  Problem Relation Age of Onset   COPD Mother    Heart disease Mother    Lung disease Father        Asbestosis   Heart attack Father    Heart disease Father    Cerebral aneurysm Brother    Aneurysm Brother        Brain   Drug abuse Daughter    Epilepsy Son    Alcohol abuse Son    Drug abuse Son    Arthritis Maternal Grandmother    Heart disease  Maternal Grandmother    Asthma Maternal Grandfather    Cancer Maternal Grandfather    Arthritis Paternal Grandmother    Heart disease Paternal Grandmother    Stroke Paternal Grandmother    Early death Paternal Grandfather    Heart disease Paternal Grandfather    Social History   Socioeconomic History   Marital status: Married    Spouse name: Not on file   Number of children: 2   Years of education: Not on file   Highest education level: Not on file  Occupational History   Occupation: disabled   Occupation: disabled  Tobacco Use   Smoking status: Every Day    Packs/day: 2.00    Years: 46.00    Additional pack years: 0.00    Total pack years: 92.00    Types: Cigarettes   Smokeless tobacco: Never   Tobacco comments:    2 packs of cigarettes smoked daily 12/13/21- declines smoking cessation  Vaping Use   Vaping Use: Never used  Substance and Sexual Activity   Alcohol use: No   Drug use: No   Sexual activity: Not Currently    Partners: Male  Other Topics Concern   Not on file  Social History Narrative   Right handed    Caffeine~ 2 cups per day    Lives at home with husband (strained relationship)   Primary caretaker for disabled brother who had aneurism   Daughter died 2018-06-24   Social Determinants of Health   Financial Resource Strain: Low Risk  (05/16/2022)   Overall Financial Resource Strain (CARDIA)    Difficulty of Paying Living Expenses: Not hard at all  Food Insecurity: No Food Insecurity (05/16/2022)   Hunger Vital Sign    Worried About Running Out of Food in the Last Year: Never true    Ran Out of Food in the Last Year: Never true  Transportation Needs: No Transportation Needs (05/16/2022)   PRAPARE - Administrator, Civil Service (Medical): No    Lack of Transportation (Non-Medical): No  Physical Activity: Inactive (05/16/2022)   Exercise Vital Sign    Days of Exercise per Week: 0 days    Minutes of Exercise per Session: 0 min  Stress:  Stress Concern Present (05/16/2022)   [employer] of Occupational Health - Occupational Stress Questionnaire    Feeling of Stress : To some extent  Social Connections: Moderately Isolated (05/16/2022)   Social Connection and Isolation Panel [NHANES]    Frequency of Communication with Friends and Family: More than three times a week    Frequency of Social Gatherings with Friends and Family: Once a week    Attends Religious Services: Never    Database administrator or Organizations: No    Attends Banker Meetings: Never    Marital Status: Married   Past Surgical History:  Procedure Laterality Date   APPENDECTOMY     1985   BIOPSY  07/24/2017   Procedure: BIOPSY;  Surgeon: Rachael Fee, MD;  Location: [facility];  Service: Endoscopy;;   CARDIAC CATHETERIZATION N/A 10/31/2015   Procedure: Left Heart Cath and Coronary Angiography;  Surgeon: Marykay Lex, MD;  Location: [facility] INVASIVE CV LAB;  Service: Cardiovascular;  Laterality: N/A;   CARPAL TUNNEL RELEASE Left    CARPAL TUNNEL RELEASE Right    CHOLECYSTECTOMY  late 1980's   COLONOSCOPY WITH PROPOFOL N/A 07/24/2017   Procedure: COLONOSCOPY WITH PROPOFOL;  Surgeon: Rachael Fee, MD;  Location: [facility];  Service: Endoscopy;  Laterality: N/A;   ESOPHAGOGASTRODUODENOSCOPY N/A 07/24/2017   Procedure: ESOPHAGOGASTRODUODENOSCOPY (EGD);  Surgeon: Rachael Fee, MD;  Location: [facility];  Service: Endoscopy;  Laterality: N/A;   ESOPHAGOGASTRODUODENOSCOPY (EGD) WITH PROPOFOL N/A 10/19/2020   Procedure: ESOPHAGOGASTRODUODENOSCOPY (EGD) WITH PROPOFOL;  Surgeon: Rachael Fee, MD;  Location: [facility];  Service: Endoscopy;  Laterality: N/A;   FINE NEEDLE ASPIRATION N/A 10/19/2020   Procedure: FINE NEEDLE ASPIRATION (FNA) LINEAR;  Surgeon: Rachael Fee, MD;  Location: [facility];  Service: Endoscopy;  Laterality: N/A;   GALLBLADDER SURGERY  1991   HIP CLOSED REDUCTION Right 01/08/2016   Procedure: CLOSED  MANIPULATION HIP;  Surgeon: Jene Every, MD;  Location: [facility];  Service: Orthopedics;  Laterality: Right;   HIP CLOSED REDUCTION Right 01/19/2016   Procedure: ATTEMPTED CLOSED REDUCTION RIGHT HIP;  Surgeon: Toni Arthurs, MD;  Location: [facility];  Service: Orthopedics;  Laterality: Right;   HIP CLOSED REDUCTION Right 01/20/2016   Procedure: CLOSED REDUCTION RIGHT TOTAL HIP;  Surgeon: Durene Romans, MD;  Location: [facility];  Service: Orthopedics;  Laterality: Right;   HIP CLOSED REDUCTION Right 02/17/2016   Procedure: CLOSED REDUCTION RIGHT TOTAL HIP;  Surgeon: Arlys John  Swinteck, MD;  Location: [facility];  Service: Orthopedics;  Laterality: Right;   HIP CLOSED REDUCTION Right 02/28/2016   Procedure: CLOSED REDUCTION HIP;  Surgeon: Yolonda Kida, MD;  Location: [facility];  Service: Orthopedics;  Laterality: Right;   IR IMAGING GUIDED PORT INSERTION  11/01/2020   POLYPECTOMY  07/24/2017   Procedure: POLYPECTOMY;  Surgeon: Rachael Fee, MD;  Location: [facility];  Service: Endoscopy;;   TONSILLECTOMY     TOTAL ABDOMINAL HYSTERECTOMY     1985, with 1 ovary removed and 2 nd ovary removed 2003   TOTAL HIP ARTHROPLASTY Right    Original surgery 2006 with revision 2010   TOTAL HIP REVISION Right 01/01/2016   Procedure: TOTAL HIP REVISION;  Surgeon: Durene Romans, MD;  Location: [facility];  Service: Orthopedics;  Laterality: Right;   TOTAL HIP REVISION Right 03/02/2016   Procedure: TOTAL HIP REVISION;  Surgeon: Durene Romans, MD;  Location: [facility];  Service: Orthopedics;  Laterality: Right;   TOTAL HIP REVISION Right 09/02/2016   Procedure: Right hip constrained liner- posterior;  Surgeon: Durene Romans, MD;  Location: [facility];  Service: Orthopedics;  Laterality: Right;   ULNAR NERVE TRANSPOSITION Right    UPPER ESOPHAGEAL ENDOSCOPIC ULTRASOUND (EUS) N/A 10/19/2020   Procedure: UPPER ESOPHAGEAL ENDOSCOPIC ULTRASOUND (EUS);  Surgeon: Rachael Fee, MD;  Location: [facility];  Service: Endoscopy;  Laterality: N/A;   periduodenal lesion   Past Surgical History:  Procedure Laterality Date   APPENDECTOMY     1985   BIOPSY  07/24/2017   Procedure: BIOPSY;  Surgeon: Rachael Fee, MD;  Location: [facility];  Service: Endoscopy;;   CARDIAC CATHETERIZATION N/A 10/31/2015   Procedure: Left Heart Cath and Coronary Angiography;  Surgeon: Marykay Lex, MD;  Location: [facility] INVAS[facility] CV LAB;  Service: Cardiovascular;  Laterality: N/A;   CARPAL TUNNEL RELEASE Left    CARPAL TUNNEL RELEASE Right    CHOLECYSTECTOMY  late 1980's   COLONOSCOPY WITH PROPOFOL N/A 07/24/2017   Procedure: COLONOSCOPY WITH PROPOFOL;  Surgeon: Rachael Fee, MD;  Location: [facility];  Service: Endoscopy;  Laterality: N/A;   ESOPHAGOGASTRODUODENOSCOPY N/A 07/24/2017   Procedure: ESOPHAGOGASTRODUODENOSCOPY (EGD);  Surgeon: Rachael Fee, MD;  Location: [facility];  Service: Endoscopy;  Laterality: N/A;   ESOPHAGOGASTRODUODENOSCOPY (EGD) WITH PROPOFOL N/A 10/19/2020   Procedure: ESOPHAGOGASTRODUODENOSCOPY (EGD) WITH PROPOFOL;  Surgeon: Rachael Fee, MD;  Location: [facility];  Service: Endoscopy;  Laterality: N/A;   FINE NEEDLE ASPIRATION N/A 10/19/2020   Procedure: FINE NEEDLE ASPIRATION (FNA) LINEAR;  Surgeon: Rachael Fee, MD;  Location: [facility];  Service: Endoscopy;  Laterality: N/A;   GALLBLADDER SURGERY  1991   HIP CLOSED REDUCTION Right 01/08/2016   Procedure: CLOSED MANIPULATION HIP;  Surgeon: Jene Every, MD;  Location: [facility];  Service: Orthopedics;  Laterality: Right;   HIP CLOSED REDUCTION Right 01/19/2016   Procedure: ATTEMPTED CLOSED REDUCTION RIGHT HIP;  Surgeon: Toni Arthurs, MD;  Location: [facility];  Service: Orthopedics;  Laterality: Right;   HIP CLOSED REDUCTION Right 01/20/2016   Procedure: CLOSED REDUCTION RIGHT TOTAL HIP;  Surgeon: Durene Romans, MD;  Location: [facility];  Service: Orthopedics;  Laterality: Right;   HIP CLOSED REDUCTION Right 02/17/2016   Procedure: CLOSED REDUCTION RIGHT TOTAL HIP;   Surgeon: Samson Frederic, MD;  Location: [facility];  Service: Orthopedics;  Laterality: Right;   HIP CLOSED REDUCTION Right 02/28/2016   Procedure: CLOSED REDUCTION HIP;  Surgeon: Yolonda Kida, MD;  Location: [facility];  Service: Orthopedics;  Laterality: Right;   IR IMAGING GUIDED PORT INSERTION  11/01/2020   POLYPECTOMY  07/24/2017   Procedure: POLYPECTOMY;  Surgeon: Rachael Fee, MD;  Location: [facility];  Service: Endoscopy;;   TONSILLECTOMY     TOTAL ABDOMINAL HYSTERECTOMY     1985, with 1 ovary removed and 2 nd ovary removed 2003   TOTAL HIP ARTHROPLASTY Right    Original surgery 2006 with revision 2010   TOTAL HIP REVISION Right 01/01/2016   Procedure: TOTAL HIP REVISION;  Surgeon: Durene Romans, MD;  Location: [facility];  Service: Orthopedics;  Laterality: Right;   TOTAL HIP REVISION Right 03/02/2016   Procedure: TOTAL HIP REVISION;  Surgeon: Durene Romans, MD;  Location: [facility];  Service: Orthopedics;  Laterality: Right;   TOTAL HIP REVISION Right 09/02/2016   Procedure: Right hip constrained liner- posterior;  Surgeon: Durene Romans, MD;  Location: [facility];  Service: Orthopedics;  Laterality: Right;   ULNAR NERVE TRANSPOSITION Right    UPPER ESOPHAGEAL ENDOSCOPIC ULTRASOUND (EUS) N/A 10/19/2020   Procedure: UPPER ESOPHAGEAL ENDOSCOPIC ULTRASOUND (EUS);  Surgeon: Rachael Fee, MD;  Location: [facility];  Service: Endoscopy;  Laterality: N/A;  periduodenal lesion   Past Medical History:  Diagnosis Date   Allergic rhinitis    Anemia    Anxiety    Chicken pox    Chronic back pain    COPD (chronic obstructive pulmonary disease) (HCC)    Depression    DM (diabetes mellitus) (HCC)    Essential hypertension    GERD (gastroesophageal reflux disease)    Headache    migraines   History of gastritis    EGD 2015   History of home oxygen therapy    2 liters at hs last 6 months   Hyperlipidemia    Hypothyroidism    Migraines    Osteoarthritis    oa   Scoliosis    BP 132/77    Pulse 89   Ht 5' 2.5" (1.588 m)   Wt 195 lb 9.6 oz (88.7 kg)   SpO2 95%   BMI 35.21 kg/m   Opioid Risk Score:   Fall Risk Score:  `1  Depression screen [facility] 2/9     06/05/2022    9:42 AM 05/16/2022    1:57 PM 04/15/2022   11:45 AM 12/13/2021    2:59 PM 10/22/2021    3:09 PM 06/22/2021    3:14 PM 05/03/2021    1:35 PM  Depression screen PHQ 2/9  Decreased Interest 3 3 3 3 1 1  0  Down, Depressed, Hopeless 3 3 3 3 1 1 1   PHQ - 2 Score 6 6 6 6 2 2 1   Altered sleeping  0 0 2   0  Tired, decreased energy   3 3   2   Change in appetite  2 2 0   0  Feeling bad or failure about yourself   0 1 0   0  Trouble concentrating  0 0 1   1  Moving slowly or fidgety/restless  0 0 0   0  Suicidal thoughts  0 0 0   0  PHQ-9 Score  8 12 12   4   Difficult doing work/chores   Somewhat difficult Not difficult at all   Somewhat difficult     Review of Systems  Constitutional: Negative.   HENT: Negative.    Eyes: Negative.   Respiratory: Negative.    Cardiovascular: Negative.   Gastrointestinal: Negative.   Endocrine:  Negative.   Genitourinary: Negative.   Musculoskeletal:  Positive for arthralgias, back pain, gait problem and myalgias.  Skin:  Positive for rash.  Allergic/Immunologic: Negative.   Hematological: Negative.   Psychiatric/Behavioral:  Positive for dysphoric mood.   All other systems reviewed and are negative.      Objective:   Physical Exam  Awake alert, obviously in pain from migraine, NAD Dressed well Trigger points in scalenes, really tight and B/L levators as well as upper traps and splenisu capitus B/L      Assessment & Plan:   Pt is a 67 yr old female with hx of newly dx'd COPD, still smoking, HTN, DM- diet controlled; anxiety and depression;  Scoliosis and deg back issues,  Generalized OA;  chronic pain- here for f/u of chronic pain issues. With new 2.2 cm R lower pulmonary nodule and hemorrhoids.  Now has dx of Small cell lung cancer extensive stage per oncology  note.  Here for f/u on chronic pain.    1.Migraine with myofascial release done to help pain.   Was at 9/10- after trigger point/myofascial release, went down to 7/10.    2. Migraine prevention- will add Topamax-   25 mg nightly x 1 week, then 50 mg nightly x 1 week, then 75 mg nightly x 1 week, then 100 mg nightly- to try and prevent migraine Call me back at 1 month and will refill Topamax at 100 mg tablet.   -most common side effects is sleepiness and some word finding deficits.    3. Wants to see if can stop immunotherapy, but suggested switching, if Oncology thinks can occur.   4. Because having neurological Sx's with migraine- always R hand goes numb as aura- , cannot do triptans- would increase her risk of stroke-   5. Nurtec- only to take within first 15 minutes of the migraine as needed- no more than 1 pill/day.    6. Has enough Zofran for nausea with migraines.    7. If patient wants, can try to trigger point injections at your next appointment- pt interested.   8. Will switch Oxy back to Norco since worked better for pt- will con't q4 hours max 150 tabs/month. Will send in 2 months - next one due to be refilled 6/5- after this current one.   9. Oral drug screen today per clinic policy.   10. F/U in 3 months- chronic pain.   I spent a total of  34  minutes on total care today- >50% coordination of care- due to  discussing getting migraines better controlled- Neurological migraines- with neuro aura- and chronic pain and how to change meds up for her and myofascial pain- did myofascial release.

## 2022-06-05 NOTE — Patient Instructions (Signed)
Pt is a 67 yr old female with hx of newly dx'd COPD, still smoking, HTN, DM- diet controlled; anxiety and depression;  Scoliosis and deg back issues,  Generalized OA;  chronic pain- here for f/u of chronic pain issues. With new 2.2 cm R lower pulmonary nodule and hemorrhoids.  Now has dx of Small cell lung cancer extensive stage per oncology note.  Here for f/u on chronic pain.    1.Migraine with myofascial release done to help pain.   Was at 9/10- after trigger point/myofascial release, went down to 7/10.    2. Migraine prevention- will add Topamax-   25 mg nightly x 1 week, then 50 mg nightly x 1 week, then 75 mg nightly x 1 week, then 100 mg nightly- to try and prevent migraine Call me back at 1 month and will refill Topamax at 100 mg tablet.   -most common side effects is sleepiness and some word finding deficits.    3. Wants to see if can stop immunotherapy, but suggested switching, if Oncology thinks can occur.   4. Because having neurological Sx's with migraine- always R hand goes numb as aura- , cannot do triptans- would increase her risk of stroke-   5. Nurtec- only to take within first 15 minutes of the migraine as needed- no more than 1 pill/day.    6. Has enough Zofran for nausea with migraines.    7. If patient wants, can try to trigger point injections at your next appointment- pt interested.   8. Will switch Oxy back to Norco since worked better for pt- will con't q4 hours max 150 tabs/month. Will send in 2 months - next one due to be refilled 6/5- after this current one.   9. Oral drug screen today per clinic policy.   10. F/U in 3 months- chronic pain.

## 2022-06-05 NOTE — Telephone Encounter (Signed)
PA for Nurtec 75 entered in Cover My Meds on 06/05/2022.

## 2022-06-06 ENCOUNTER — Telehealth: Payer: Self-pay | Admitting: *Deleted

## 2022-06-06 NOTE — Telephone Encounter (Signed)
Appeal call ref # [id number] [facility])

## 2022-06-07 ENCOUNTER — Encounter: Payer: Self-pay | Admitting: Physician Assistant

## 2022-06-07 ENCOUNTER — Other Ambulatory Visit (INDEPENDENT_AMBULATORY_CARE_PROVIDER_SITE_OTHER): Payer: 59

## 2022-06-07 ENCOUNTER — Ambulatory Visit (INDEPENDENT_AMBULATORY_CARE_PROVIDER_SITE_OTHER): Payer: 59 | Admitting: Physician Assistant

## 2022-06-07 VITALS — BP 110/52 | HR 92 | Ht 62.5 in | Wt 196.4 lb

## 2022-06-07 DIAGNOSIS — R932 Abnormal findings on diagnostic imaging of liver and biliary tract: Secondary | ICD-10-CM | POA: Diagnosis not present

## 2022-06-07 LAB — PROTIME-INR
INR: 1.1 ratio — ABNORMAL HIGH (ref 0.8–1.0)
Prothrombin Time: 11.4 s (ref 9.6–13.1)

## 2022-06-07 LAB — FERRITIN: Ferritin: 6.8 ng/mL — ABNORMAL LOW (ref 10.0–291.0)

## 2022-06-07 NOTE — Progress Notes (Signed)
Agree with assessment and plan as outlined.  

## 2022-06-07 NOTE — Progress Notes (Signed)
Subjective:    Patient ID: [name], female    DOB: 1955-04-09, 67 y.o.   MRN: [id number]  HPI [name] is a pleasant 67 year old white female, previous patient of Dr. Christella Hartigan, now established with Dr. Adela Lank.  She is referred back today by oncology/Dr. Gracy Bruins PA-C after recent imaging with CT of the abdomen, then MRI on 05/22/2022 raised concern for early cirrhosis. Patient has stage IV small cell lung cancer, diagnosed in 2022.  She has been on chemotherapy since that time, currently on atezolizumab for maintenance therapy q. 3 weeks.  She has significant fatigue and says she really is not able to do much of anything at all, and does not have any improvement in the symptoms in between courses of therapy. She has had good response to therapy and with repeat staging CT on 04/26/2022 she does not have any masses or adenopathy in her chest, was noted to have a slightly nodular appearing liver raising concern for early cirrhosis, status postcholecystectomy, there is been slow interval growth of an indeterminate lesion of the right kidney, no evidence of metastatic disease in the abdomen. Subsequent MRI of the abdomen was done on 05/22/2022 shows no focal liver an abnormality, minimally coarse contour of the liver, there is hepatic signal inversion as well as intrinsic T2 hypointensity consistent with hepatic iron deposition.  The renal lesion is felt consistent with an adenoma. Most recent labs done 05/29/2022 normal liver function studies, hemoglobin 12.4/platelets 185.  Patient has not had any prior diagnosis of liver disease or cirrhosis.  T imaging from September 2023 in December 2023 with no mention of any abnormality of the liver. No EtOH history, no family history of liver disease, unaware of any hemochromatosis history.  She has had prior EGD and colonoscopy here last done 2019, she had one 3 mm polyp removed.  She also has history of COPD, sleep apnea/chronic respiratory  failure.  No current oxygen use  Patient says she is considering stopping her maintenance immunotherapy because she does not have a good quality of life is severely fatigued and that fatigue never resolves between treatments.  She says the best she can do is sit at home and watch TV she has difficulty ambulating. She had skipped a treatment recently because of development of a rash and says that she felt the best that she had in some time with a much longer interval before between treatments.  Review of Systems Pertinent positive and negative review of systems were noted in the above HPI section.  All other review of systems was otherwise negative.   Outpatient Encounter Medications as of 06/07/2022  Medication Sig   albuterol (PROAIR HFA) 108 (90 Base) MCG/ACT inhaler 2 puffs every 4 hours as needed only  if your can't catch your breath   ALPRAZolam (XANAX) 1 MG tablet TAKE 1 TABLET BY MOUTH THREE TIMES DAILY AS NEEDED FOR ANXIETY   atorvastatin (LIPITOR) 20 MG tablet TAKE 1 TABLET BY MOUTH AT BEDTIME   benzonatate (TESSALON) 100 MG capsule Take 1 capsule (100 mg total) by mouth 3 (three) times daily.   Budeson-Glycopyrrol-Formoterol (BREZTRI AEROSPHERE) 160-9-4.8 MCG/ACT AERO Inhale 2 puffs into the lungs in the morning and at bedtime.   CREON 36000-114000 units CPEP capsule TAKE TWO CAPSULES BY MOUTH THREE TIMES DAILY WITH MEALS   cyclobenzaprine (FLEXERIL) 10 MG tablet Take 10 mg by mouth 3 (three) times daily as needed.   dicyclomine (BENTYL) 10 MG capsule TAKE ONE CAPSULE BY MOUTH TWICE DAILY  diphenoxylate-atropine (LOMOTIL) 2.5-0.025 MG tablet Take 1 tablet by mouth 4 (four) times daily as needed for diarrhea or loose stools.   DULoxetine (CYMBALTA) 60 MG capsule Take 2 capsules (120 mg total) by mouth daily.   fluticasone (FLONASE) 50 MCG/ACT nasal spray INSTILL 2 SPRAYS IN EACH NOSTRIL EVERY DAY   furosemide (LASIX) 20 MG tablet TAKE 1 TABLET BY MOUTH TWICE DAILY, IF SWELLING IMPROVES  CAN HOLD MEDICATION   guaiFENesin-codeine 100-10 MG/5ML syrup Take 5 mLs by mouth every 6 (six) hours as needed for cough.   HYDROcodone-acetaminophen (NORCO) 10-325 MG tablet Take 1 tablet by mouth every 4 (four) hours as needed. Due to be refilled 6/5   hydrOXYzine (ATARAX) 10 MG tablet Take 1 tablet (10 mg total) by mouth 3 (three) times daily as needed for itching.   levothyroxine (SYNTHROID) 137 MCG tablet Take 1 tablet (137 mcg total) by mouth daily before breakfast.   loratadine (CLARITIN) 10 MG tablet Take 10 mg by mouth daily.   meloxicam (MOBIC) 15 MG tablet TAKE 1 TABLET BY MOUTH EVERY DAY   OLANZapine (ZYPREXA) 10 MG tablet Take 1 tablet (10 mg total) by mouth at bedtime.   omeprazole (PRILOSEC) 40 MG capsule TAKE ONE CAPSULE BY MOUTH TWICE DAILY   ondansetron (ZOFRAN) 8 MG tablet TAKE 1 TABLET BY MOUTH TWICE DAILY   permethrin (ELIMITE) 5 % cream apply 1 application TOPICALLY FOR 1 DOSE, massage cream from head TO soles of FEET, LEAVE ON FOR 8-14 hours (put ON BEFORE bed AND SHOWER IN THE MORNING) BEFORE removing clean ALL clothes/sheets in hot water   potassium chloride SA (KLOR-CON M) 20 MEQ tablet TAKE 1 TABLET BY MOUTH TWICE DAILY   prochlorperazine (COMPAZINE) 10 MG tablet Take 1 tablet (10 mg total) by mouth every 6 (six) hours as needed for nausea or vomiting.   Rimegepant Sulfate (NURTEC) 75 MG TBDP Take 1 tablet (75 mg total) by mouth daily as needed (for migraine-). Take within 15 minutes of initial symptoms   tacrolimus (PROTOPIC) 0.1 % ointment Apply topically 2 (two) times daily. APPLY TO ITCHY AREAS TWICE A DAY FOR UP TO TWO WEEKS. ALTERNATE WITH TRIAMCINOLONE CREAM   topiramate (TOPAMAX) 25 MG tablet Take 1 tablet (25 mg total) by mouth at bedtime. X 1 week, then 50 mg QHS x 1 week, then 75 mg QHS x 1 week, then 100 mg QHS- stop increasing if migraines dramatically improve- for migraine prevention   triamcinolone ointment (KENALOG) 0.5 % Apply 1 Application topically 3  (three) times daily. Apply thin-layer on affected areas, no more than 2 weeks consistently.   triamterene-hydrochlorothiazide (MAXZIDE-25) 37.5-25 MG tablet TAKE 1 TABLET BY MOUTH DAILY   HYDROcodone-acetaminophen (NORCO) 10-325 MG tablet Take 1 tablet by mouth every 4 (four) hours as needed. (Patient not taking: Reported on 06/07/2022)   [DISCONTINUED] Oxycodone HCl 10 MG TABS Take 1 tablet (10 mg total) by mouth every 4 (four) hours as needed. Goal - to replace Norco- max 5 tabs/day   No facility-administered encounter medications on file as of 06/07/2022.   Allergies  Allergen Reactions   Metformin And Related Diarrhea   Nsaids Diarrhea   Wellbutrin [Bupropion] Other (See Comments)    Makes her too sleepy    Aleve [Naproxen Sodium] Other (See Comments)    Headache    Codeine Nausea Only and Other (See Comments)    GI upset   Penicillins Nausea Only and Other (See Comments)    GI upset    Sulfonamide  Derivatives Hives   Patient Active Problem List   Diagnosis Date Noted   Intractable chronic migraine with aura and without status migrainosus 06/05/2022   Macular degeneration of left eye 03/06/2022   Nausea without vomiting 03/14/2021   Abnormality of gait 03/05/2021   Type 2 diabetes mellitus with diabetic neuropathy, without long-term current use of insulin (HCC) 01/08/2021   Port-A-Cath in place 01/02/2021   E. coli sepsis (HCC) 12/19/2020   Nocturnal hypoxemia 11/27/2020   Small cell lung cancer (HCC) 10/27/2020   Pulmonary nodule 08/02/2020   Tobacco abuse 07/13/2020   Arthritis of carpometacarpal (CMC) joint of both thumbs 05/29/2020   Diabetes mellitus, new onset (HCC) 04/12/2019   Tremor 11/05/2018   Chronic respiratory failure with hypoxia (HCC) 01/12/2018   GERD with esophagitis 11/26/2017   Dysphagia 08/20/2017   Chronic pain syndrome 08/04/2017   Comorbid sleep-related hypoventilation 08/04/2017   OSA (obstructive sleep apnea) 08/04/2017   Hepatic steatosis  08/04/2017   Polyp of ascending colon    Diverticulosis of colon without hemorrhage    Gastritis and gastroduodenitis    Hypothyroidism 07/22/2017   Anxiety and depression 07/22/2017   Iron deficiency anemia 07/22/2017   Pilar cyst 07/22/2017   Morbid obesity due to excess calories (HCC) 03/19/2017   Anemia 03/19/2017   Chronic obstructive pulmonary disease (HCC) 03/18/2017   Degenerative lumbar spinal stenosis 02/17/2017   S/P right hip revision 09/02/2016   S/P hip replacement 09/02/2016   Instability of prosthetic hip (HCC) 03/01/2016   Dislocation of hip prosthesis (HCC) 02/17/2016   Abnormal nuclear stress test: Intermediate Risk 10/31/2015   Atypical angina 10/31/2015   Trigger finger, acquired 08/20/2012   Cigarette smoker 12/12/2008   Hyperlipidemia 11/17/2008   Anxiety state 11/16/2008   HYPERTENSION, BENIGN 11/16/2008   Primary osteoarthritis involving multiple joints 11/16/2008   Social History   Socioeconomic History   Marital status: Married    Spouse name: Not on file   Number of children: 2   Years of education: Not on file   Highest education level: Not on file  Occupational History   Occupation: disabled   Occupation: disabled  Tobacco Use   Smoking status: Every Day    Packs/day: 2.00    Years: 46.00    Additional pack years: 0.00    Total pack years: 92.00    Types: Cigarettes   Smokeless tobacco: Never   Tobacco comments:    2 packs of cigarettes smoked daily 12/13/21- declines smoking cessation  Vaping Use   Vaping Use: Never used  Substance and Sexual Activity   Alcohol use: No   Drug use: No   Sexual activity: Not Currently    Partners: Male  Other Topics Concern   Not on file  Social History Narrative   Right handed    Caffeine~ 2 cups per day    Lives at home with husband (strained relationship)   Primary caretaker for disabled brother who had aneurism   Daughter died 06-20-18   Social Determinants of Health   Financial  Resource Strain: Low Risk  (05/16/2022)   Overall Financial Resource Strain (CARDIA)    Difficulty of Paying Living Expenses: Not hard at all  Food Insecurity: No Food Insecurity (05/16/2022)   Hunger Vital Sign    Worried About Running Out of Food in the Last Year: Never true    Ran Out of Food in the Last Year: Never true  Transportation Needs: No Transportation Needs (05/16/2022)   PRAPARE - Transportation  Lack of Transportation (Medical): No    Lack of Transportation (Non-Medical): No  Physical Activity: Inactive (05/16/2022)   Exercise Vital Sign    Days of Exercise per Week: 0 days    Minutes of Exercise per Session: 0 min  Stress: Stress Concern Present (05/16/2022)   [employer] of Occupational Health - Occupational Stress Questionnaire    Feeling of Stress : To some extent  Social Connections: Moderately Isolated (05/16/2022)   Social Connection and Isolation Panel [NHANES]    Frequency of Communication with Friends and Family: More than three times a week    Frequency of Social Gatherings with Friends and Family: Once a week    Attends Religious Services: Never    Database administrator or Organizations: No    Attends Banker Meetings: Never    Marital Status: Married  Catering manager Violence: Not At Risk (05/16/2022)   Humiliation, Afraid, Rape, and Kick questionnaire    Fear of Current or Ex-Partner: No    Emotionally Abused: No    Physically Abused: No    Sexually Abused: No    Ms. Martucci's family history includes Alcohol abuse in her son; Aneurysm in her brother; Arthritis in her maternal grandmother and paternal grandmother; Asthma in her maternal grandfather; COPD in her mother; Cancer in her maternal grandfather; Cerebral aneurysm in her brother; Drug abuse in her daughter and son; Early death in her paternal grandfather; Epilepsy in her son; Heart attack in her father; Heart disease in her father, maternal grandmother, mother, paternal grandfather,  and paternal grandmother; Lung disease in her father; Stroke in her paternal grandmother.      Objective:    Vitals:   06/07/22 1330  BP: (!) 110/52  Pulse: 92    Physical Exam ;Well-developed chronically ill-appearing older white female in no acute distress.  Ambulating with a walker  Weight, 196 BMI 35.5  HEENT; nontraumatic normocephalic, EOMI, PE R LA, sclera anicteric. Oropharynx; not examined today Neck; supple, no JVD Cardiovascular; regular rate and rhythm with S1-S2, no murmur rub or gallop Pulmonary; Clear bilaterally Abdomen; soft, nontender, nondistended, no palpable mass or hepatosplenomegaly, bowel sounds are active, mild asymmetry and skin folds Rectal; not done today Skin; benign exam, no jaundice rash or appreciable lesions Extremities; no clubbing cyanosis or edema skin warm and dry Neuro/Psych; alert and oriented x4, grossly nonfocal mood and affect appropriate        Assessment & Plan:   #68 67 year old with new finding on CT imaging and then MRI of the abdomen April 2024 suggesting early cirrhosis.  This is in the setting of patient with stage IV small cell lung cancer initially diagnosed 2022, on continuous chemotherapy since and currently on maintenance immunotherapy with atezolizumab. Patient has not had evidence of active disease/metastatic disease on most recent imaging, but is considering stopping treatment due to severe fatigue which has been very limiting and does not resolve between treatments  Imaging does suggest early cirrhosis, most recent labs with normal LFTs and normal platelet count.  If she does have cirrhosis it appears compensated. Etiology of cirrhosis is not clear at this time MRI suggests iron deposition-will rule out hemochromatosis.,  Will also surveilled for other etiologies of chronic liver disease.  She has been undergoing chemotherapy over the past 2 years, and certainly that may be contributory.  #2 COPD/chronic respiratory  failure 3.  Chronic migraines 4.  Sleep apnea 5.  Hypertension 6.  History of colon polyps.-Colonoscopy June 2019 with 1 4  mm tubular adenoma  Plan; will schedule for abdominal ultrasound with elastography Pro time/INR Viral serologies, ferritin, ANA, smooth muscle antibody, antimitochondrial antibodies. If ferritin elevated, can pursue hemochromatosis evaluation Patient was reassured that the imaging findings suggest early cirrhosis and this will be something that we will follow and manage.  She is thinking hard about whether she wants to continue chemotherapy/immunotherapy, and her decisions regarding management of her stage IV small cell lung cancer will help to determine need for any further GI evaluation.   Shraga Custard S Eland Lamantia PA-C 06/07/2022   Cc: Allwardt, Crist Infante, PA-C

## 2022-06-07 NOTE — Patient Instructions (Signed)
_______________________________________________________  If your blood pressure at your visit was 140/90 or greater, please contact your primary care physician to follow up on this. _______________________________________________________  If you are age 66 or older, your body mass index should be between 23-30. Your Body mass index is 35.35 kg/m. If this is out of the aforementioned range listed, please consider follow up with your Primary Care Provider. ________________________________________________________  The [place] [facility] providers would like to encourage you to use [facility] to communicate with providers for non-urgent requests or questions.  Due to long hold times on the telephone, sending your provider a message by [facility] may be a faster and more efficient way to get a response.  Please allow 48 business hours for a response.  Please remember that this is for non-urgent requests.  _______________________________________________________  Your provider has requested that you go to the basement level for lab work before leaving today. Press "B" on the elevator. The lab is located at the first door on the left as you exit the elevator.  You have been scheduled for an abdominal ultrasound with electrography at [facility] (1st floor of hospital) on 06-18-22 at 9:00am. Please arrive 15 minutes prior to your appointment for registration. Make certain not to have anything to eat or drink after midnight the night prior to your appointment. Should you need to reschedule your appointment, please contact radiology at [phone]. This test typically takes about 30 minutes to perform.  Due to recent changes in healthcare laws, you may see the results of your imaging and laboratory studies on MyChart before your provider has had a chance to review them.  We understand that in some cases there may be results that are confusing or concerning to you. Not all laboratory results come back in the same  time frame and the provider may be waiting for multiple results in order to interpret others.  Please give [place] 48 hours in order for your provider to thoroughly review all the results before contacting the office for clarification of your results.   You are scheduled to follow up with Dr Adela Lank on 09-12-22 at 10:10am  Thank you for entrusting me with your care and choosing [facility].  Dr Christella Hartigan

## 2022-06-09 LAB — ANTI-NUCLEAR AB-TITER (ANA TITER): ANA Titer 1: 1:80 {titer} — ABNORMAL HIGH

## 2022-06-09 LAB — HEPATITIS C ANTIBODY: Hepatitis C Ab: NONREACTIVE

## 2022-06-10 LAB — DRUG TOX MONITOR 1 W/CONF, ORAL FLD
Alprazolam: >25 ng/mL — ABNORMAL HIGH (ref <0.50)
Amphetamines: NEGATIVE ng/mL (ref <10)
Barbiturates: NEGATIVE ng/mL (ref <10)
Benzodiazepines: POSITIVE ng/mL — AB (ref <0.50)
Buprenorphine: NEGATIVE ng/mL (ref <0.10)
Chlordiazepoxide: NEGATIVE ng/mL (ref <0.50)
Clonazepam: NEGATIVE ng/mL (ref <0.50)
Cocaine: NEGATIVE ng/mL (ref <5.0)
Codeine: NEGATIVE ng/mL (ref <2.5)
Cotinine: 28.9 ng/mL — ABNORMAL HIGH (ref <5.0)
Diazepam: NEGATIVE ng/mL (ref <0.50)
Dihydrocodeine: NEGATIVE ng/mL (ref <2.5)
Fentanyl: NEGATIVE ng/mL (ref <0.10)
Flunitrazepam: NEGATIVE ng/mL (ref <0.50)
Flurazepam: NEGATIVE ng/mL (ref <0.50)
Heroin Metabolite: NEGATIVE ng/mL (ref <1.0)
Hydrocodone: NEGATIVE ng/mL (ref <2.5)
Hydromorphone: NEGATIVE ng/mL (ref <2.5)
Lorazepam: NEGATIVE ng/mL (ref <0.50)
MARIJUANA: NEGATIVE ng/mL (ref <2.5)
MDMA: NEGATIVE ng/mL (ref <10)
Meprobamate: NEGATIVE ng/mL (ref <2.5)
Methadone: NEGATIVE ng/mL (ref <5.0)
Midazolam: NEGATIVE ng/mL (ref <0.50)
Morphine: NEGATIVE ng/mL (ref <2.5)
Nicotine Metabolite: POSITIVE ng/mL — AB (ref <5.0)
Nordiazepam: NEGATIVE ng/mL (ref <0.50)
Norhydrocodone: NEGATIVE ng/mL (ref <2.5)
Noroxycodone: 26.2 ng/mL — ABNORMAL HIGH (ref <2.5)
Opiates: POSITIVE ng/mL — AB (ref <2.5)
Oxazepam: NEGATIVE ng/mL (ref <0.50)
Oxycodone: 153.2 ng/mL — ABNORMAL HIGH (ref <2.5)
Oxymorphone: NEGATIVE ng/mL (ref <2.5)
Phencyclidine: NEGATIVE ng/mL (ref <10)
Tapentadol: NEGATIVE ng/mL (ref <5.0)
Temazepam: NEGATIVE ng/mL (ref <0.50)
Tramadol: NEGATIVE ng/mL (ref <5.0)
Triazolam: NEGATIVE ng/mL (ref <0.50)
Zolpidem: NEGATIVE ng/mL (ref <5.0)

## 2022-06-10 LAB — DRUG TOX ALC METAB W/CON, ORAL FLD: Alcohol Metabolite: NEGATIVE ng/mL (ref <25)

## 2022-06-11 LAB — HEPATITIS B CORE ANTIBODY, TOTAL: Hep B Core Total Ab: NONREACTIVE

## 2022-06-11 LAB — MITOCHONDRIAL ANTIBODIES: Mitochondrial M2 Ab, IgG: <=20 U (ref <=20.0)

## 2022-06-12 ENCOUNTER — Telehealth: Payer: Self-pay | Admitting: Pharmacist

## 2022-06-12 LAB — HEPATITIS B SURFACE ANTIBODY,QUALITATIVE: Hep B S Ab: NONREACTIVE

## 2022-06-12 LAB — ANA: Anti Nuclear Antibody (ANA): POSITIVE — AB

## 2022-06-12 LAB — HEPATITIS B SURFACE ANTIGEN: Hepatitis B Surface Ag: NONREACTIVE

## 2022-06-12 LAB — ANTI-NUCLEAR AB-TITER (ANA TITER)

## 2022-06-12 LAB — ANTI-SMOOTH MUSCLE ANTIBODY, IGG: Actin (Smooth Muscle) Antibody (IGG): <20 U

## 2022-06-12 NOTE — Progress Notes (Signed)
Care Management & Coordination Services Pharmacy Team  Reason for Encounter: General adherence update   Contacted patient for general health update and medication adherence call.  Spoke with patient on 06/12/2022    What concerns do you have about your medications? none  The patient denies side effects with their medications.   How often do you forget or accidentally miss a dose? Rarely  Do you use a pillbox? Yes  Are you having any problems getting your medications from your pharmacy? No  Has the cost of your medications been a concern? No If yes, what medication and is patient assistance available or has it been applied for?  Since last visit with PharmD, no interventions have been made.   The patient {has/has not:25209} had an ED visit since last contact.   The patient {denies/reports:25180} problems with their health.   Patient {denies/reports:25180} concerns or questions for ***, PharmD at this time.   Counseled patient on: {GENERALCOUNSELING:28686}   Chart Updates:  Recent office visits:  04/15/2022 OV (PCP) Allwardt, Crist Infante, PA-C;   Recent consult visits:  06/07/2022 OV (Gastro) Esterwood, Amy S, PA-C; no medication changes indicated.  06/05/2022 OV (Phys Med) Genice Rouge, MD; Will switch Oxy back to [facility] since worked better for pt- will con't q4 hours max 150 tabs/month   05/29/2022 OV (Oncology) Sharon Mt T, PA-C; no medication changes indicated.  05/08/2022 OV (Oncology) Sharon Mt T, PA-C; no medication changes indicated.  05/02/2022 OV (Derm) Terri Piedra, MD; no medication changes indicated.  04/17/2022 OV (Oncology) Johnney Killian, PA-C; Will add mirtazapine in order to help with appetite/sleep   Hospital visits:  {Hospital DC Yes/No:21091515}  Medications: Outpatient Encounter Medications as of 06/12/2022  Medication Sig Note   albuterol (PROAIR HFA) 108 (90 Base) MCG/ACT inhaler 2 puffs every 4 hours as needed only  if your can't catch  your breath    ALPRAZolam (XANAX) 1 MG tablet TAKE 1 TABLET BY MOUTH THREE TIMES DAILY AS NEEDED FOR ANXIETY 06/05/2022: Last dose today 06/05/22   atorvastatin (LIPITOR) 20 MG tablet TAKE 1 TABLET BY MOUTH AT BEDTIME    benzonatate (TESSALON) 100 MG capsule Take 1 capsule (100 mg total) by mouth 3 (three) times daily.    Budeson-Glycopyrrol-Formoterol (BREZTRI AEROSPHERE) 160-9-4.8 MCG/ACT AERO Inhale 2 puffs into the lungs in the morning and at bedtime.    CREON 36000-114000 units CPEP capsule TAKE TWO CAPSULES BY MOUTH THREE TIMES DAILY WITH MEALS    cyclobenzaprine (FLEXERIL) 10 MG tablet Take 10 mg by mouth 3 (three) times daily as needed.    dicyclomine (BENTYL) 10 MG capsule TAKE ONE CAPSULE BY MOUTH TWICE DAILY    diphenoxylate-atropine (LOMOTIL) 2.5-0.025 MG tablet Take 1 tablet by mouth 4 (four) times daily as needed for diarrhea or loose stools.    DULoxetine (CYMBALTA) 60 MG capsule Take 2 capsules (120 mg total) by mouth daily.    fluticasone (FLONASE) 50 MCG/ACT nasal spray INSTILL 2 SPRAYS IN EACH NOSTRIL EVERY DAY    furosemide (LASIX) 20 MG tablet TAKE 1 TABLET BY MOUTH TWICE DAILY, IF SWELLING IMPROVES CAN HOLD MEDICATION    guaiFENesin-codeine 100-10 MG/5ML syrup Take 5 mLs by mouth every 6 (six) hours as needed for cough.    HYDROcodone-acetaminophen (NORCO) 10-325 MG tablet Take 1 tablet by mouth every 4 (four) hours as needed. (Patient not taking: Reported on 06/07/2022)    HYDROcodone-acetaminophen (NORCO) 10-325 MG tablet Take 1 tablet by mouth every 4 (four) hours as needed. Due to  be refilled 6/5    hydrOXYzine (ATARAX) 10 MG tablet Take 1 tablet (10 mg total) by mouth 3 (three) times daily as needed for itching.    levothyroxine (SYNTHROID) 137 MCG tablet Take 1 tablet (137 mcg total) by mouth daily before breakfast.    loratadine (CLARITIN) 10 MG tablet Take 10 mg by mouth daily.    meloxicam (MOBIC) 15 MG tablet TAKE 1 TABLET BY MOUTH EVERY DAY    OLANZapine (ZYPREXA) 10 MG  tablet Take 1 tablet (10 mg total) by mouth at bedtime.    omeprazole (PRILOSEC) 40 MG capsule TAKE ONE CAPSULE BY MOUTH TWICE DAILY    ondansetron (ZOFRAN) 8 MG tablet TAKE 1 TABLET BY MOUTH TWICE DAILY    permethrin (ELIMITE) 5 % cream apply 1 application TOPICALLY FOR 1 DOSE, massage cream from head TO soles of FEET, LEAVE ON FOR 8-14 hours (put ON BEFORE bed AND SHOWER IN THE MORNING) BEFORE removing clean ALL clothes/sheets in hot water    potassium chloride SA (KLOR-CON M) 20 MEQ tablet TAKE 1 TABLET BY MOUTH TWICE DAILY    prochlorperazine (COMPAZINE) 10 MG tablet Take 1 tablet (10 mg total) by mouth every 6 (six) hours as needed for nausea or vomiting.    Rimegepant Sulfate (NURTEC) 75 MG TBDP Take 1 tablet (75 mg total) by mouth daily as needed (for migraine-). Take within 15 minutes of initial symptoms    tacrolimus (PROTOPIC) 0.1 % ointment Apply topically 2 (two) times daily. APPLY TO ITCHY AREAS TWICE A DAY FOR UP TO TWO WEEKS. ALTERNATE WITH TRIAMCINOLONE CREAM    topiramate (TOPAMAX) 25 MG tablet Take 1 tablet (25 mg total) by mouth at bedtime. X 1 week, then 50 mg QHS x 1 week, then 75 mg QHS x 1 week, then 100 mg QHS- stop increasing if migraines dramatically improve- for migraine prevention    triamcinolone ointment (KENALOG) 0.5 % Apply 1 Application topically 3 (three) times daily. Apply thin-layer on affected areas, no more than 2 weeks consistently.    triamterene-hydrochlorothiazide (MAXZIDE-25) 37.5-25 MG tablet TAKE 1 TABLET BY MOUTH DAILY    No facility-administered encounter medications on file as of 06/12/2022.    Recent vitals BP Readings from Last 3 Encounters:  06/07/22 (!) 110/52  06/05/22 132/77  05/29/22 120/74   Pulse Readings from Last 3 Encounters:  06/07/22 92  06/05/22 89  05/29/22 84   Wt Readings from Last 3 Encounters:  06/07/22 196 lb 6 oz (89.1 kg)  06/05/22 195 lb 9.6 oz (88.7 kg)  05/29/22 197 lb 3.2 oz (89.4 kg)   BMI Readings from Last 3  Encounters:  06/07/22 35.35 kg/m  06/05/22 35.21 kg/m  05/29/22 35.49 kg/m    Recent lab results    Component Value Date/Time   NA 134 (L) 05/29/2022 1041   NA 134 09/13/2020 1416   NA 144 06/30/2014 1448   K 3.6 05/29/2022 1041   K 3.6 06/30/2014 1448   CL 98 05/29/2022 1041   CO2 30 05/29/2022 1041   CO2 25 06/30/2014 1448   GLUCOSE 95 05/29/2022 1041   GLUCOSE 86 06/30/2014 1448   BUN 9 05/29/2022 1041   BUN 8 09/13/2020 1416   BUN 19.0 06/30/2014 1448   CREATININE 0.94 05/29/2022 1041   CREATININE 1.19 (H) 03/12/2019 1354   CREATININE 1.0 06/30/2014 1448   CALCIUM 8.5 (L) 05/29/2022 1041   CALCIUM 8.2 (L) 06/30/2014 1448    Lab Results  Component Value Date   CREATININE  0.94 05/29/2022   GFR 61.04 06/28/2020   EGFR 70 09/13/2020   GFRNONAA >60 05/29/2022   GFRAA >60 07/07/2019   Lab Results  Component Value Date/Time   HGBA1C 5.9 (A) 03/06/2022 11:50 AM   HGBA1C 6.5 01/05/2021 01:13 PM   HGBA1C 6.7 (H) 09/04/2020 03:35 PM   MICROALBUR 0.4 03/12/2019 01:54 PM    Lab Results  Component Value Date   CHOL 98 11/30/2019   HDL 31.90 (L) 11/30/2019   LDLCALC 27 11/30/2019   LDLDIRECT 72.0 08/31/2018   TRIG 193.0 (H) 11/30/2019   CHOLHDL 3 11/30/2019    Care Gaps: Annual wellness visit in last year? {yes/no:20286}  If Diabetic: Last eye exam / retinopathy screening: Last diabetic foot exam: Last UACR:   Star Rating Drugs: *** Medication:  Last Fill: Day Supply   Future Appointments  Date Time Provider Department Center  06/13/2022  1:30 PM Terri Piedra, DO [facility] None  06/18/2022  9:00 AM [facility] 1 [facility] [place]  06/19/2022  8:30 AM [facility] FLUSH [facility] None  06/19/2022  9:00 AM Jaci Standard, MD [facility] None  06/19/2022  9:45 AM Noreene Larsson, RD [facility] None  06/19/2022 10:00 AM [facility] INFUSION [facility] None  06/26/2022  3:35 PM Carlos Levering, NP [facility] None  07/11/2022 10:30 AM [facility] FLUSH  [facility] None  07/11/2022 11:00 AM Jaci Standard, MD [facility] None  07/11/2022 11:45 AM [facility] INFUSION [facility] None  07/31/2022  9:30 AM [facility] FLUSH [facility] None  07/31/2022 10:00 AM Jaci Standard, MD [facility] None  07/31/2022 11:00 AM [facility] INFUSION [facility] None  09/04/2022 11:40 AM Genice Rouge, MD [facility] CPR  09/05/2022 12:30 PM Allwardt, Crist Infante, PA-C [facility]EC  09/12/2022 10:10 AM Armbruster, Willaim Rayas, MD [facility]  05/20/2023  1:00 PM [facility] ANNUAL WELLNESS VISIT 1 [facility]   April D Calhoun, [facility] Clinical Pharmacist Assistant [phone]

## 2022-06-12 NOTE — Telephone Encounter (Signed)
[name] (Key: [id number]) - [id number] Nurtec 75MG  dispersible tablets Status: PA Response - Denied

## 2022-06-13 ENCOUNTER — Encounter: Payer: Self-pay | Admitting: Dermatology

## 2022-06-13 ENCOUNTER — Ambulatory Visit (INDEPENDENT_AMBULATORY_CARE_PROVIDER_SITE_OTHER): Payer: 59 | Admitting: Dermatology

## 2022-06-13 DIAGNOSIS — L299 Pruritus, unspecified: Secondary | ICD-10-CM | POA: Diagnosis not present

## 2022-06-13 NOTE — Progress Notes (Signed)
   Follow-Up Visit   Subjective  [name] is a 67 y.o. female who presents for the following: Pruritus Follow Up The Plan from her prior visit on 05/02/22 was the following: -The chronic itching is most likely a cumulative effect from her Immunotherapy and made worse by using harsh soaps and hot water   -Permethrin prescription was sent due to incomplete treatment of scabies from PCP -Triamcinolone cream twice a day for up to two weeks alternating with tacrolimus. Patient has Triamcinolone at home from previous prescription -Tacrolimus twice a day for up to two weeks alternating with Triamcinolone -STOP using Dial soal, Instead Use Dove Sensitive soap in the shower --> NO HOT WATER -Cereve anti itch moisturizer every day and is ok to reapply throughout the day   Patient completed the treatment of the Permethrin and has continued using the triamcinolone. She was unable to get the tacrolimus. There are no bumps or itching anymore other than an occasional itching under the arms.   The following portions of the chart were reviewed this encounter and updated as appropriate: medications, allergies, medical history  Review of Systems:  No other skin or systemic complaints except as noted in HPI or Assessment and Plan.  Objective  Well appearing patient in no apparent distress; mood and affect are within normal limits.  A focused examination was performed of the following areas: Axillae Area   Relevant exam findings are noted in the Assessment and Plan.    Assessment & Plan   Pruritis (Secondary to Immunotherapy ) Exam: pt's itching has greatly improved. No excoriations or active lesions on PE  Treatment Plan: Pt doing better after d/c'ing Dial soap and changing to gentle skincare.   Take a break from Triamcinolone (2 weeks on / 2 weeks off as needed)  Pt had not yet purchased CeraVe Anti itch, Can try Aveeno Anti Itch lotion, sample given today      Return in 2 months (on  08/13/2022) for pruritis .    Documentation: I have reviewed the above documentation for accuracy and completeness, and I agree with the above.  Langston Reusing, DO  I, Germaine Pomfret, CMA, am acting as scribe for [employer], DO.

## 2022-06-13 NOTE — Patient Instructions (Signed)
Due to recent changes in healthcare laws, you may see results of your pathology and/or laboratory studies on MyChart before the doctors have had a chance to review them. We understand that in some cases there may be results that are confusing or concerning to you. Please understand that not all results are received at the same time and often the doctors may need to interpret multiple results in order to provide you with the best plan of care or course of treatment. Therefore, we ask that you please give us 2 business days to thoroughly review all your results before contacting the office for clarification. Should we see a critical lab result, you will be contacted sooner.   If You Need Anything After Your Visit  If you have any questions or concerns for your doctor, please call our main line at [phone] If no one answers, please leave a voicemail as directed and we will return your call as soon as possible. Messages left after 4 pm will be answered the following business day.   You may also send us a message via MyChart. We typically respond to MyChart messages within 1-2 business days.  For prescription refills, please ask your pharmacy to contact our office. Our fax number is [phone].  If you have an urgent issue when the clinic is closed that cannot wait until the next business day, you can page your doctor at the number below.    Please note that while we do our best to be available for urgent issues outside of office hours, we are not available 24/7.   If you have an urgent issue and are unable to reach us, you may choose to seek medical care at your doctor's office, retail clinic, urgent care center, or emergency room.  If you have a medical emergency, please immediately call 911 or go to the emergency department. In the event of inclement weather, please call our main line at [phone] for an update on the status of any delays or closures.  Dermatology Medication Tips: Please  keep the boxes that topical medications come in in order to help keep track of the instructions about where and how to use these. Pharmacies typically print the medication instructions only on the boxes and not directly on the medication tubes.   If your medication is too expensive, please contact our office at [phone] or send us a message through MyChart.   We are unable to tell what your co-pay for medications will be in advance as this is different depending on your insurance coverage. However, we may be able to find a substitute medication at lower cost or fill out paperwork to get insurance to cover a needed medication.   If a prior authorization is required to get your medication covered by your insurance company, please allow us 1-2 business days to complete this process.  Drug prices often vary depending on where the prescription is filled and some pharmacies may offer cheaper prices.  The website www.goodrx.com contains coupons for medications through different pharmacies. The prices here do not account for what the cost may be with help from insurance (it may be cheaper with your insurance), but the website can give you the price if you did not use any insurance.  - You can print the associated coupon and take it with your prescription to the pharmacy.  - You may also stop by our office during regular business hours and pick up a GoodRx coupon card.  - If you need your   prescription sent electronically to a different pharmacy, notify our office through [facility] MyChart or by phone at [phone]     

## 2022-06-14 NOTE — Telephone Encounter (Signed)
Appeal faxed for Nurtec

## 2022-06-16 ENCOUNTER — Encounter (HOSPITAL_BASED_OUTPATIENT_CLINIC_OR_DEPARTMENT_OTHER): Payer: Self-pay | Admitting: Pulmonary Disease

## 2022-06-16 ENCOUNTER — Other Ambulatory Visit: Payer: Self-pay | Admitting: Pulmonary Disease

## 2022-06-18 ENCOUNTER — Telehealth: Payer: Self-pay | Admitting: Physician Assistant

## 2022-06-18 ENCOUNTER — Ambulatory Visit (HOSPITAL_COMMUNITY)
Admission: RE | Admit: 2022-06-18 | Discharge: 2022-06-18 | Disposition: A | Discharge status: Home / Self Care | Payer: 59 | Source: Ambulatory Visit | Attending: Physician Assistant | Admitting: Physician Assistant

## 2022-06-18 DIAGNOSIS — R932 Abnormal findings on diagnostic imaging of liver and biliary tract: Secondary | ICD-10-CM

## 2022-06-18 DIAGNOSIS — K7689 Other specified diseases of liver: Secondary | ICD-10-CM | POA: Diagnosis not present

## 2022-06-18 MED ORDER — TRELEGY ELLIPTA 200-62.5-25 MCG/ACT IN AEPB: 1.0000 | INHALATION_SPRAY | Freq: Every day | RESPIRATORY_TRACT | 5 refills | Status: DC

## 2022-06-18 MED ORDER — ALBUTEROL SULFATE HFA 108 (90 BASE) MCG/ACT IN AERS: INHALATION_SPRAY | RESPIRATORY_TRACT | 5 refills | Status: AC

## 2022-06-18 NOTE — Telephone Encounter (Signed)
Appeal Nurtec approved  Auth # [id number] VALID 06/05/22-01/28/23

## 2022-06-18 NOTE — Telephone Encounter (Signed)
Inbound call from patient inquiring about coming in to have her labs done. Please advise.  Thank you

## 2022-06-19 ENCOUNTER — Inpatient Hospital Stay (HOSPITAL_BASED_OUTPATIENT_CLINIC_OR_DEPARTMENT_OTHER): Payer: 59 | Admitting: Hematology and Oncology

## 2022-06-19 ENCOUNTER — Inpatient Hospital Stay: Payer: 59

## 2022-06-19 ENCOUNTER — Other Ambulatory Visit: Payer: Self-pay | Admitting: *Deleted

## 2022-06-19 ENCOUNTER — Inpatient Hospital Stay: Payer: 59 | Admitting: Dietician

## 2022-06-19 VITALS — BP 152/87 | HR 79 | Temp 97.7°F | Resp 16 | Wt 190.5 lb

## 2022-06-19 DIAGNOSIS — R200 Anesthesia of skin: Secondary | ICD-10-CM | POA: Diagnosis not present

## 2022-06-19 DIAGNOSIS — K7460 Unspecified cirrhosis of liver: Secondary | ICD-10-CM | POA: Diagnosis not present

## 2022-06-19 DIAGNOSIS — C3490 Malignant neoplasm of unspecified part of unspecified bronchus or lung: Secondary | ICD-10-CM

## 2022-06-19 DIAGNOSIS — R197 Diarrhea, unspecified: Secondary | ICD-10-CM | POA: Diagnosis not present

## 2022-06-19 DIAGNOSIS — I10 Essential (primary) hypertension: Secondary | ICD-10-CM | POA: Diagnosis not present

## 2022-06-19 DIAGNOSIS — E785 Hyperlipidemia, unspecified: Secondary | ICD-10-CM | POA: Diagnosis not present

## 2022-06-19 DIAGNOSIS — D500 Iron deficiency anemia secondary to blood loss (chronic): Secondary | ICD-10-CM

## 2022-06-19 DIAGNOSIS — F32A Depression, unspecified: Secondary | ICD-10-CM | POA: Diagnosis not present

## 2022-06-19 DIAGNOSIS — E876 Hypokalemia: Secondary | ICD-10-CM | POA: Diagnosis not present

## 2022-06-19 DIAGNOSIS — G629 Polyneuropathy, unspecified: Secondary | ICD-10-CM | POA: Diagnosis not present

## 2022-06-19 DIAGNOSIS — R600 Localized edema: Secondary | ICD-10-CM | POA: Diagnosis not present

## 2022-06-19 DIAGNOSIS — Z5112 Encounter for antineoplastic immunotherapy: Secondary | ICD-10-CM | POA: Diagnosis not present

## 2022-06-19 DIAGNOSIS — E119 Type 2 diabetes mellitus without complications: Secondary | ICD-10-CM | POA: Diagnosis not present

## 2022-06-19 DIAGNOSIS — Z79620 Long term (current) use of immunosuppressive biologic: Secondary | ICD-10-CM | POA: Diagnosis not present

## 2022-06-19 DIAGNOSIS — K219 Gastro-esophageal reflux disease without esophagitis: Secondary | ICD-10-CM | POA: Diagnosis not present

## 2022-06-19 DIAGNOSIS — Z95828 Presence of other vascular implants and grafts: Secondary | ICD-10-CM | POA: Diagnosis not present

## 2022-06-19 DIAGNOSIS — Z79899 Other long term (current) drug therapy: Secondary | ICD-10-CM | POA: Diagnosis not present

## 2022-06-19 DIAGNOSIS — F17210 Nicotine dependence, cigarettes, uncomplicated: Secondary | ICD-10-CM | POA: Diagnosis not present

## 2022-06-19 DIAGNOSIS — C3431 Malignant neoplasm of lower lobe, right bronchus or lung: Secondary | ICD-10-CM | POA: Diagnosis not present

## 2022-06-19 DIAGNOSIS — Z79621 Long term (current) use of calcineurin inhibitor: Secondary | ICD-10-CM | POA: Diagnosis not present

## 2022-06-19 DIAGNOSIS — G8929 Other chronic pain: Secondary | ICD-10-CM | POA: Diagnosis not present

## 2022-06-19 DIAGNOSIS — J449 Chronic obstructive pulmonary disease, unspecified: Secondary | ICD-10-CM | POA: Diagnosis not present

## 2022-06-19 DIAGNOSIS — D3502 Benign neoplasm of left adrenal gland: Secondary | ICD-10-CM | POA: Diagnosis not present

## 2022-06-19 DIAGNOSIS — R110 Nausea: Secondary | ICD-10-CM | POA: Diagnosis not present

## 2022-06-19 DIAGNOSIS — K5730 Diverticulosis of large intestine without perforation or abscess without bleeding: Secondary | ICD-10-CM | POA: Diagnosis not present

## 2022-06-19 LAB — CBC WITH DIFFERENTIAL (CANCER CENTER ONLY)
Abs Immature Granulocytes: 0.04 10*3/uL (ref 0.00–0.07)
Basophils Absolute: 0.1 10*3/uL (ref 0.0–0.1)
Basophils Relative: 1 %
Eosinophils Absolute: 0.8 10*3/uL — ABNORMAL HIGH (ref 0.0–0.5)
Eosinophils Relative: 9 %
HCT: 38.5 % (ref 36.0–46.0)
Hemoglobin: 12.5 g/dL (ref 12.0–15.0)
Immature Granulocytes: 1 %
Lymphocytes Relative: 27 %
Lymphs Abs: 2.4 10*3/uL (ref 0.7–4.0)
MCH: 26.8 pg (ref 26.0–34.0)
MCHC: 32.5 g/dL (ref 30.0–36.0)
MCV: 82.6 fL (ref 80.0–100.0)
Monocytes Absolute: 0.7 10*3/uL (ref 0.1–1.0)
Monocytes Relative: 8 %
Neutro Abs: 4.8 10*3/uL (ref 1.7–7.7)
Neutrophils Relative %: 54 %
Platelet Count: 194 10*3/uL (ref 150–400)
RBC: 4.66 MIL/uL (ref 3.87–5.11)
RDW: 18.9 % — ABNORMAL HIGH (ref 11.5–15.5)
WBC Count: 8.7 10*3/uL (ref 4.0–10.5)
nRBC: 0 % (ref 0.0–0.2)

## 2022-06-19 LAB — IRON AND IRON BINDING CAPACITY (CC-WL,HP ONLY)
Iron: 41 ug/dL (ref 28–170)
Saturation Ratios: 9 % — ABNORMAL LOW (ref 10.4–31.8)
TIBC: 452 ug/dL — ABNORMAL HIGH (ref 250–450)
UIBC: 411 ug/dL (ref 148–442)

## 2022-06-19 LAB — CMP (CANCER CENTER ONLY)
ALT: 7 U/L (ref 0–44)
AST: 11 U/L — ABNORMAL LOW (ref 15–41)
Albumin: 3.8 g/dL (ref 3.5–5.0)
Alkaline Phosphatase: 70 U/L (ref 38–126)
Anion gap: 6 (ref 5–15)
BUN: 14 mg/dL (ref 8–23)
CO2: 27 mmol/L (ref 22–32)
Calcium: 8.5 mg/dL — ABNORMAL LOW (ref 8.9–10.3)
Chloride: 102 mmol/L (ref 98–111)
Creatinine: 0.98 mg/dL (ref 0.44–1.00)
GFR, Estimated: >60 mL/min (ref >60)
Glucose, Bld: 94 mg/dL (ref 70–99)
Potassium: 4.3 mmol/L (ref 3.5–5.1)
Sodium: 135 mmol/L (ref 135–145)
Total Bilirubin: 0.4 mg/dL (ref 0.3–1.2)
Total Protein: 6.4 g/dL — ABNORMAL LOW (ref 6.5–8.1)

## 2022-06-19 LAB — TSH: TSH: 0.238 u[IU]/mL — ABNORMAL LOW (ref 0.350–4.500)

## 2022-06-19 MED ORDER — SODIUM CHLORIDE 0.9 % IV SOLN
1200.0000 mg | Freq: Once | INTRAVENOUS | Status: AC
  Administered 2022-06-19: 1200 mg via INTRAVENOUS
  Filled 2022-06-19: qty 20

## 2022-06-19 MED ORDER — SODIUM CHLORIDE 0.9% FLUSH
10.0000 mL | Freq: Once | INTRAVENOUS | Status: AC
  Administered 2022-06-19: 10 mL

## 2022-06-19 MED ORDER — SODIUM CHLORIDE 0.9 % IV SOLN: Freq: Once | INTRAVENOUS | Status: AC

## 2022-06-19 MED ORDER — SODIUM CHLORIDE 0.9% FLUSH
10.0000 mL | INTRAVENOUS | Status: DC | PRN
  Administered 2022-06-19: 10 mL

## 2022-06-19 MED ORDER — HEPARIN SOD (PORK) LOCK FLUSH 100 UNIT/ML IV SOLN
500.0000 [IU] | Freq: Once | INTRAVENOUS | Status: AC | PRN
  Administered 2022-06-19: 500 [IU]

## 2022-06-19 MED ORDER — FERROUS SULFATE 325 (65 FE) MG PO TBEC: 325.0000 mg | DELAYED_RELEASE_TABLET | Freq: Every day | ORAL | 3 refills | Status: DC

## 2022-06-19 MED ORDER — ONDANSETRON HCL 4 MG/2ML IJ SOLN
8.0000 mg | Freq: Once | INTRAMUSCULAR | Status: AC
  Administered 2022-06-19: 8 mg via INTRAVENOUS
  Filled 2022-06-19: qty 4

## 2022-06-19 NOTE — Telephone Encounter (Signed)
Left message for pt to call back  °

## 2022-06-19 NOTE — Progress Notes (Signed)
Nutrition Follow-up:  Patient with extensive stage small cell lung cancer. She is receiving maintenance therapy with atezolizumab q21d.   Met with patient in infusion. She reports overall doing okay besides persistent fatigue. Patient continues to have decreased appetite, recalls 2 small meals and maybe a snack midday. She reports being followed by GI for diarrhea. Patient has started PERT and reports diarrhea is not as frequent. She is taking 72,000 units after eating TID. She denies nausea, vomiting.    Medications: reviewed   Labs: reviewed   Anthropometrics: Wt 190 lb 8 oz today decreased 3% (6 lbs) in 2 weeks - severe for time frame  5/10 - 196 lb 6 oz   NUTRITION DIAGNOSIS: Inadequate oral intake continues     INTERVENTION:  Educated on PERT and recommend taking first capsule with first bite of food, followed by second capsule half way through meal to receive maximum benefit - handout provided  Encouraged high calorie high protein snacks - pt does not like ONS    MONITORING, EVALUATION, GOAL: weight trends, intake   NEXT VISIT: Wednesday July 3 during infusion

## 2022-06-19 NOTE — Progress Notes (Signed)
[name] [phone]) [phone]   [phone]  PROGRESS NOTE  Patient Care Team: Allwardt, Crist Infante, PA-C as PCP - General (Physician Assistant) Rachael Fee, MD as Attending Physician (Gastroenterology) Dr. Louie Bun, MD as Consulting Physician (Orthopedic Surgery) Ranee Gosselin, MD as Consulting Physician (Orthopedic Surgery) York Spaniel, MD (Inactive) as Consulting Physician (Neurology) Smitty Cords, OD as Consulting Physician (Optometry) Karel Jarvis Lesle Chris, MD as Consulting Physician (Neurology) Erroll Luna, [facility] (Pharmacist)  Hematological/Oncological History # Small Cell Lung Cancer, Extensive Stage 07/05/2020: CT abdomen for lower abdominal pain. New pulmonary nodular density noted 07/06/2020: CT chest showed 2.2 cm macrolobulated right lower lobe pulmonary nodule (favored) versus pathologically enlarged infrahilar lymph node 10/13/2020: PET CT scan performed, findings show 2 cm right lower lobe lung mass is hypermetabolic and consistent with primary lung neoplasm. Additionally found hypermetabolic 17 mm soft tissue lesion between the descending duodenum and the pancreatic head  10/19/2020: EGD to biopsy hypermetabolic lymph node. Biopsy results show small cell lung cancer 10/26/2020: establish care with Dr. Leonides Schanz  11/13/2020: Cycle 1 Day 1 of Carbo/Etop/Atezolizumab 12/04/2020: Cycle 2 Day 1 of Carbo/Etop/Atezolizumab 11/22-11/25/2022: admitted for E. Coli bacteremia/sepsis. Start of Cycle 3 delayed. 01/02/2021: Cycle 3 Day 1 of Carbo/Etop/Atezolizumab 01/23/2021: Cycle 4 Day 1 of Carbo/Etop/Atezolizumab 02/22/2021: Cycle 5 Day 1 of Atezolizumab Maintenance. Delayed start due to patient's COVID infection.  03/14/2021: Cycle 6 Day 1 of Atezolizumab Maintenance 04/05/2021: Cycle 7 Day 1 of Atezolizumab Maintenance 04/25/2021: Cycle 8 Day 1 of Atezolizumab Maintenance 05/16/2021: Cycle 9 Day 1 of Atezolizumab Maintenance 06/06/2021:  Cycle 10 Day 1 of Atezolizumab Maintenance 06/27/2021:  Cycle 11 Day 1 of Atezolizumab Maintenance 07/18/2021: Cycle 12 Day 1 of Atezolizumab Maintenance 08/08/2021: Cycle 13 Day 1 of Atezolizumab Maintenance 08/29/2021: Cycle 14 Day 1 of Atezolizumab Maintenance 09/19/2021: Cycle 15 Day 1 of Atezolizumab Maintenance 10/10/2021: Cycle 16 Day 1 of Atezolizumab Maintenance 10/31/2021: Cycle 17 Day 1 of Atezolizumab Maintenance 11/21/2021: Cycle 18 Day 1 of Atezolizumab Maintenance 12/12/2021: Cycle 19 Day 1 of Atezolizumab Maintenance 01/02/2022: Cycle 20 Day 1 of Atezolizumab Maintenance 01/23/2022: Cycle 21 Day 1 of Atezolizumab Maintenance 02/13/2022: treatment HELD due to diarrhea.  03/06/2022: Cycle 22 Day 1 of Atezolizumab Maintenance 03/27/2022: Cycle 23 Day 1 of Atezolizumab Maintenance 04/17/2022: Cycle 24 Day 1 of Atezolizumab Maintenance 05/08/2022:  Cycle 25 Day 1 of Atezolizumab Maintenance (Held due to patient preference for treatment holiday) 05/29/2022: Cycle 25 Day 1 of Atezolizumab Maintenance  06/19/2022: Cycle 26 Day 1 of Atezolizumab Maintenance   Interval History:  [name] 67 y.o. female with medical history significant for extensive stage small cell lung cancer who presents for a follow up visit. The patient's last visit was on 05/29/2022.  She presents today to start cycle 26 of chemotherapy.  On exam today Mrs. [name] reports she has been well overall interim since her last visit.  She continues to have fatigue with her immunotherapy treatment.  She reports that she was surprised to hear that she had a low iron levels.  She reports she is to eat nothing but meat and potatoes but now eats much less in the way of meat.  She reports that otherwise she has had no major changes in her health.  She denies any nausea, vomiting, or diarrhea.  Her migraines are under better control.  She is willing and able to proceed with treatment at this time.  She denies fevers, chills, sweats, chest  pain or cough. She has  no other complaints.A full 10 point ROS is listed below.   MEDICAL HISTORY:  Past Medical History:  Diagnosis Date   Allergic rhinitis    Anemia    Anxiety    Chicken pox    Chronic back pain    COPD (chronic obstructive pulmonary disease) (HCC)    Depression    DM (diabetes mellitus) (HCC)    Essential hypertension    GERD (gastroesophageal reflux disease)    Headache    migraines   History of gastritis    EGD 2015   History of home oxygen therapy    2 liters at hs last 6 months   Hyperlipidemia    Hypothyroidism    Migraines    Osteoarthritis    oa   Scoliosis    Small cell lung cancer (HCC)    Stage 4    SURGICAL HISTORY: Past Surgical History:  Procedure Laterality Date   APPENDECTOMY     1985   BIOPSY  07/24/2017   Procedure: BIOPSY;  Surgeon: Rachael Fee, MD;  Location: [facility];  Service: Endoscopy;;   CARDIAC CATHETERIZATION N/A 10/31/2015   Procedure: Left Heart Cath and Coronary Angiography;  Surgeon: Marykay Lex, MD;  Location: [name];  Service: Cardiovascular;  Laterality: N/A;   CARPAL TUNNEL RELEASE Left    CARPAL TUNNEL RELEASE Right    CHOLECYSTECTOMY  late 1980's   COLONOSCOPY WITH PROPOFOL N/A 07/24/2017   Procedure: COLONOSCOPY WITH PROPOFOL;  Surgeon: Rachael Fee, MD;  Location: [facility];  Service: Endoscopy;  Laterality: N/A;   ESOPHAGOGASTRODUODENOSCOPY N/A 07/24/2017   Procedure: ESOPHAGOGASTRODUODENOSCOPY (EGD);  Surgeon: Rachael Fee, MD;  Location: [facility];  Service: Endoscopy;  Laterality: N/A;   ESOPHAGOGASTRODUODENOSCOPY (EGD) WITH PROPOFOL N/A 10/19/2020   Procedure: ESOPHAGOGASTRODUODENOSCOPY (EGD) WITH PROPOFOL;  Surgeon: Rachael Fee, MD;  Location: [facility];  Service: Endoscopy;  Laterality: N/A;   FINE NEEDLE ASPIRATION N/A 10/19/2020   Procedure: FINE NEEDLE ASPIRATION (FNA) LINEAR;  Surgeon: Rachael Fee, MD;  Location: [facility];  Service: Endoscopy;   Laterality: N/A;   GALLBLADDER SURGERY  1991   HIP CLOSED REDUCTION Right 01/08/2016   Procedure: CLOSED MANIPULATION HIP;  Surgeon: Jene Every, MD;  Location: [facility];  Service: Orthopedics;  Laterality: Right;   HIP CLOSED REDUCTION Right 01/19/2016   Procedure: ATTEMPTED CLOSED REDUCTION RIGHT HIP;  Surgeon: Toni Arthurs, MD;  Location: [facility];  Service: Orthopedics;  Laterality: Right;   HIP CLOSED REDUCTION Right 01/20/2016   Procedure: CLOSED REDUCTION RIGHT TOTAL HIP;  Surgeon: Durene Romans, MD;  Location: [facility];  Service: Orthopedics;  Laterality: Right;   HIP CLOSED REDUCTION Right 02/17/2016   Procedure: CLOSED REDUCTION RIGHT TOTAL HIP;  Surgeon: Samson Frederic, MD;  Location: [facility];  Service: Orthopedics;  Laterality: Right;   HIP CLOSED REDUCTION Right 02/28/2016   Procedure: CLOSED REDUCTION HIP;  Surgeon: Yolonda Kida, MD;  Location: [facility];  Service: Orthopedics;  Laterality: Right;   IR IMAGING GUIDED PORT INSERTION  11/01/2020   POLYPECTOMY  07/24/2017   Procedure: POLYPECTOMY;  Surgeon: Rachael Fee, MD;  Location: [facility];  Service: Endoscopy;;   TONSILLECTOMY     TOTAL ABDOMINAL HYSTERECTOMY     1985, with 1 ovary removed and 2 nd ovary removed 2003   TOTAL HIP ARTHROPLASTY Right    Original surgery 2006 with revision 2010   TOTAL HIP REVISION Right 01/01/2016   Procedure: TOTAL HIP REVISION;  Surgeon: Durene Romans,  MD;  Location: [facility];  Service: Orthopedics;  Laterality: Right;   TOTAL HIP REVISION Right 03/02/2016   Procedure: TOTAL HIP REVISION;  Surgeon: Durene Romans, MD;  Location: [facility];  Service: Orthopedics;  Laterality: Right;   TOTAL HIP REVISION Right 09/02/2016   Procedure: Right hip constrained liner- posterior;  Surgeon: Durene Romans, MD;  Location: [facility];  Service: Orthopedics;  Laterality: Right;   ULNAR NERVE TRANSPOSITION Right    UPPER ESOPHAGEAL ENDOSCOPIC ULTRASOUND (EUS) N/A 10/19/2020   Procedure: UPPER ESOPHAGEAL ENDOSCOPIC  ULTRASOUND (EUS);  Surgeon: Rachael Fee, MD;  Location: [facility];  Service: Endoscopy;  Laterality: N/A;  periduodenal lesion    SOCIAL HISTORY: Social History   Socioeconomic History   Marital status: Married    Spouse name: Not on file   Number of children: 2   Years of education: Not on file   Highest education level: Not on file  Occupational History   Occupation: disabled   Occupation: disabled  Tobacco Use   Smoking status: Every Day    Packs/day: 2.00    Years: 46.00    Additional pack years: 0.00    Total pack years: 92.00    Types: Cigarettes   Smokeless tobacco: Never   Tobacco comments:    2 packs of cigarettes smoked daily 12/13/21- declines smoking cessation  Vaping Use   Vaping Use: Never used  Substance and Sexual Activity   Alcohol use: No   Drug use: No   Sexual activity: Not Currently    Partners: Male  Other Topics Concern   Not on file  Social History Narrative   Right handed    Caffeine~ 2 cups per day    Lives at home with husband (strained relationship)   Primary caretaker for disabled brother who had aneurism   Daughter died July 13, 2018   Social Determinants of Health   Financial Resource Strain: Low Risk  (05/16/2022)   Overall Financial Resource Strain (CARDIA)    Difficulty of Paying Living Expenses: Not hard at all  Food Insecurity: No Food Insecurity (05/16/2022)   Hunger Vital Sign    Worried About Running Out of Food in the Last Year: Never true    Ran Out of Food in the Last Year: Never true  Transportation Needs: No Transportation Needs (05/16/2022)   PRAPARE - Administrator, Civil Service (Medical): No    Lack of Transportation (Non-Medical): No  Physical Activity: Inactive (05/16/2022)   Exercise Vital Sign    Days of Exercise per Week: 0 days    Minutes of Exercise per Session: 0 min  Stress: Stress Concern Present (05/16/2022)   [employer] of Occupational Health - Occupational Stress Questionnaire     Feeling of Stress : To some extent  Social Connections: Moderately Isolated (05/16/2022)   Social Connection and Isolation Panel [NHANES]    Frequency of Communication with Friends and Family: More than three times a week    Frequency of Social Gatherings with Friends and Family: Once a week    Attends Religious Services: Never    Database administrator or Organizations: No    Attends Banker Meetings: Never    Marital Status: Married  Catering manager Violence: Not At Risk (05/16/2022)   Humiliation, Afraid, Rape, and Kick questionnaire    Fear of Current or Ex-Partner: No    Emotionally Abused: No    Physically Abused: No    Sexually Abused: No    FAMILY  HISTORY: Family History  Problem Relation Age of Onset   COPD Mother    Heart disease Mother    Lung disease Father        Asbestosis   Heart attack Father    Heart disease Father    Cerebral aneurysm Brother    Aneurysm Brother        Brain   Drug abuse Daughter    Epilepsy Son    Alcohol abuse Son    Drug abuse Son    Arthritis Maternal Grandmother    Heart disease Maternal Grandmother    Asthma Maternal Grandfather    Cancer Maternal Grandfather    Arthritis Paternal Grandmother    Heart disease Paternal Grandmother    Stroke Paternal Grandmother    Early death Paternal Grandfather    Heart disease Paternal Grandfather     ALLERGIES:  is allergic to metformin and related, nsaids, wellbutrin [bupropion], aleve [naproxen sodium], codeine, penicillins, and sulfonamide derivatives.  MEDICATIONS:  Current Outpatient Medications  Medication Sig Dispense Refill   topiramate (TOPAMAX) 25 MG tablet Take 1 tablet (25 mg total) by mouth at bedtime. X 1 week, then 50 mg QHS x 1 week, then 75 mg QHS x 1 week, then 100 mg QHS- stop increasing if migraines dramatically improve- for migraine prevention 120 tablet 5   albuterol (PROAIR HFA) 108 (90 Base) MCG/ACT inhaler 2 puffs every 4 hours as needed only  if your  can't catch your breath 18 g 5   ALPRAZolam (XANAX) 1 MG tablet TAKE 1 TABLET BY MOUTH THREE TIMES DAILY AS NEEDED FOR ANXIETY 90 tablet 2   atorvastatin (LIPITOR) 20 MG tablet TAKE 1 TABLET BY MOUTH AT BEDTIME 90 tablet 1   benzonatate (TESSALON) 100 MG capsule Take 1 capsule (100 mg total) by mouth 3 (three) times daily. 90 capsule 1   CREON 36000-114000 units CPEP capsule TAKE TWO CAPSULES BY MOUTH THREE TIMES DAILY WITH MEALS 180 capsule 1   cyclobenzaprine (FLEXERIL) 10 MG tablet Take 10 mg by mouth 3 (three) times daily as needed.     dicyclomine (BENTYL) 10 MG capsule TAKE ONE CAPSULE BY MOUTH TWICE DAILY 60 capsule 1   diphenoxylate-atropine (LOMOTIL) 2.5-0.025 MG tablet Take 1 tablet by mouth 4 (four) times daily as needed for diarrhea or loose stools. 60 tablet 0   DULoxetine (CYMBALTA) 60 MG capsule Take 2 capsules (120 mg total) by mouth daily. 180 capsule 1   fluticasone (FLONASE) 50 MCG/ACT nasal spray INSTILL 2 SPRAYS IN EACH NOSTRIL EVERY DAY 16 g 6   Fluticasone-Umeclidin-Vilant (TRELEGY ELLIPTA) 200-62.5-25 MCG/ACT AEPB Inhale 1 puff into the lungs daily at 2 PM. 60 each 5   furosemide (LASIX) 20 MG tablet TAKE 1 TABLET BY MOUTH TWICE DAILY, IF SWELLING IMPROVES CAN HOLD MEDICATION 90 tablet 0   HYDROcodone-acetaminophen (NORCO) 10-325 MG tablet Take 1 tablet by mouth every 4 (four) hours as needed. Due to be refilled 6/5 150 tablet 0   hydrOXYzine (ATARAX) 10 MG tablet Take 1 tablet (10 mg total) by mouth 3 (three) times daily as needed for itching. 30 tablet 0   levothyroxine (SYNTHROID) 137 MCG tablet Take 1 tablet (137 mcg total) by mouth daily before breakfast. 90 tablet 1   loratadine (CLARITIN) 10 MG tablet Take 10 mg by mouth daily.     meloxicam (MOBIC) 15 MG tablet TAKE 1 TABLET BY MOUTH EVERY DAY 30 tablet 2   OLANZapine (ZYPREXA) 10 MG tablet Take 1 tablet (10 mg total)  by mouth at bedtime. 30 tablet 2   omeprazole (PRILOSEC) 40 MG capsule TAKE ONE CAPSULE BY MOUTH  TWICE DAILY 180 capsule 3   ondansetron (ZOFRAN) 8 MG tablet TAKE 1 TABLET BY MOUTH TWICE DAILY 60 tablet 1   permethrin (ELIMITE) 5 % cream apply 1 application TOPICALLY FOR 1 DOSE, massage cream from head TO soles of FEET, LEAVE ON FOR 8-14 hours (put ON BEFORE bed AND SHOWER IN THE MORNING) BEFORE removing clean ALL clothes/sheets in hot water (Patient not taking: Reported on 06/19/2022)     potassium chloride SA (KLOR-CON M) 20 MEQ tablet TAKE 1 TABLET BY MOUTH TWICE DAILY 60 tablet 2   prochlorperazine (COMPAZINE) 10 MG tablet Take 1 tablet (10 mg total) by mouth every 6 (six) hours as needed for nausea or vomiting. 30 tablet 0   Rimegepant Sulfate (NURTEC) 75 MG TBDP Take 1 tablet (75 mg total) by mouth daily as needed (for migraine-). Take within 15 minutes of initial symptoms (Patient not taking: Reported on 06/19/2022) 8 tablet 5   tacrolimus (PROTOPIC) 0.1 % ointment Apply topically 2 (two) times daily. APPLY TO ITCHY AREAS TWICE A DAY FOR UP TO TWO WEEKS. ALTERNATE WITH TRIAMCINOLONE CREAM 100 g 0   triamcinolone ointment (KENALOG) 0.5 % Apply 1 Application topically 3 (three) times daily. Apply thin-layer on affected areas, no more than 2 weeks consistently. 60 g 2   triamterene-hydrochlorothiazide (MAXZIDE-25) 37.5-25 MG tablet TAKE 1 TABLET BY MOUTH DAILY 90 tablet 2   No current facility-administered medications for this visit.    REVIEW OF SYSTEMS:   Constitutional: ( - ) fevers, ( - )  chills , ( - ) night sweats Eyes: ( - ) blurriness of vision, ( - ) double vision, ( - ) watery eyes Ears, nose, mouth, throat, and face: ( - ) mucositis, ( - ) sore throat Respiratory: ( - ) cough, ( -) dyspnea, ( - ) wheezes Cardiovascular: ( - ) palpitation, ( - ) chest discomfort, ( - ) lower extremity swelling Gastrointestinal:  ( +) nausea, ( - ) heartburn, ( - ) change in bowel habits Skin: ( - ) abnormal skin rashes Lymphatics: ( - ) new lymphadenopathy, ( - ) easy bruising Neurological: (+  ) numbness, ( - ) tingling, ( - ) new weaknesses Behavioral/Psych: ( - ) mood change, ( - ) new changes  All other systems were reviewed with the patient and are negative.  PHYSICAL EXAMINATION: ECOG PERFORMANCE STATUS: 1 - Symptomatic but completely ambulatory  Vitals:   06/19/22 0907  BP: (!) 152/87  Pulse: 79  Resp: 16  Temp: 97.7 F (36.5 C)  SpO2: 98%     Filed Weights   06/19/22 0907  Weight: 190 lb 8 oz (86.4 kg)      GENERAL: Well-appearing middle-age Caucasian female, alert, no distress and comfortable SKIN: skin color, texture, turgor are normal, no rashes or significant lesions EYES: conjunctiva are pink and non-injected, sclera clear LUNGS:  normal breathing effort. Diffuse wheezing hear on ausculation.  HEART: regular rate & rhythm and no murmurs. Mild bilateral lower extremity edema Musculoskeletal: no cyanosis of digits and no clubbing  PSYCH: alert & oriented x 3, fluent speech NEURO: no focal motor/sensory deficits  LABORATORY DATA:  I have reviewed the data as listed    Latest Ref Rng & Units 06/19/2022    8:36 AM 05/29/2022   10:41 AM 05/08/2022    9:58 AM  CBC  WBC 4.0 - 10.5 K/uL  8.7  7.4  8.5   Hemoglobin 12.0 - 15.0 g/dL 09.8  11.9  14.7   Hematocrit 36.0 - 46.0 % 38.5  38.5  37.0   Platelets 150 - 400 K/uL 194  185  169        Latest Ref Rng & Units 06/19/2022    8:36 AM 05/29/2022   10:41 AM 05/08/2022    9:58 AM  CMP  Glucose 70 - 99 mg/dL 94  95  92   BUN 8 - 23 mg/dL 14  9  9    Creatinine 0.44 - 1.00 mg/dL 8.29  5.62  1.30   Sodium 135 - 145 mmol/L 135  134  135   Potassium 3.5 - 5.1 mmol/L 4.3  3.6  3.8   Chloride 98 - 111 mmol/L 102  98  98   CO2 22 - 32 mmol/L 27  30  30    Calcium 8.9 - 10.3 mg/dL 8.5  8.5  8.7   Total Protein 6.5 - 8.1 g/dL 6.4  6.3  6.4   Total Bilirubin 0.3 - 1.2 mg/dL 0.4  0.4  0.3   Alkaline Phos 38 - 126 U/L 70  71  64   AST 15 - 41 U/L 11  14  13    ALT 0 - 44 U/L 7  9  7      No results found for:  "MPROTEIN" Lab Results  Component Value Date   KPAFRELGTCHN 0.75 06/30/2014   LAMBDASER 3.78 (H) 06/30/2014   KAPLAMBRATIO 0.20 (L) 06/30/2014     RADIOGRAPHIC STUDIES: US ABDOMEN COMPLETE W/ELASTOGRAPHY  Result Date: 06/18/2022 CLINICAL DATA:  Abnormal liver on MRI.  Rule out cirrhosis. EXAM: ULTRASOUND ABDOMEN ULTRASOUND HEPATIC ELASTOGRAPHY TECHNIQUE: Sonography of the upper abdomen was performed. In addition, ultrasound elastography evaluation of the liver was performed. A region of interest was placed within the right lobe of the liver. Following application of a compressive sonographic pulse, tissue compressibility was assessed. Multiple assessments were performed at the selected site. Median tissue compressibility was determined. Previously, hepatic stiffness was assessed by shear wave velocity. Based on recently published Society of Radiologists in Ultrasound consensus article, reporting is now recommended to be performed in the SI units of pressure (kiloPascals) representing hepatic stiffness/elasticity. The obtained result is compared to the published reference standards. (cACLD = compensated Advanced Chronic Liver Disease) COMPARISON:  MRI 05/22/2022.  CT 04/26/2022. FINDINGS: ULTRASOUND ABDOMEN Gallbladder: Prior cholecystectomy Common bile duct: Diameter: Mildly prominent related to post cholecystectomy state, 8 mm. Liver: Coarsened echotexture, mildly nodular contours. Findings suspicious for early cirrhosis. No focal hepatic abnormality. Portal vein is patent on color Doppler imaging with normal direction of blood flow towards the liver. IVC: No abnormality visualized. Pancreas: Visualized portion unremarkable. Spleen: Upper limits normal with a craniocaudal length of 12.5 cm and a splenic volume of 394 mL. Right Kidney: Length: 10.9 cm. Echogenicity within normal limits. No mass or hydronephrosis visualized. Left Kidney: Length: 10.3 cm. Echogenicity within normal limits. No mass or  hydronephrosis visualized. Abdominal aorta: No aneurysm visualized. Other findings: None. ULTRASOUND HEPATIC ELASTOGRAPHY Device: Siemens Helix VTQ Patient position: Supine Transducer DAX Number of measurements: 12 Hepatic segment:  8 Median kPa: 2.4 IQR: 0.7 IQR/Median kPa ratio: 0.29 Data quality:  Good Diagnostic category:  < or = 5 kPa: high probability of being normal The use of hepatic elastography is applicable to patients with viral hepatitis and non-alcoholic fatty liver disease. At this time, there is insufficient data for the referenced cut-off values and  use in other causes of liver disease, including alcoholic liver disease. Patients, however, may be assessed by elastography and serve as their own reference standard/baseline. In patients with non-alcoholic liver disease, the values suggesting compensated advanced chronic liver disease (cACLD) may be lower, and patients may need additional testing with elasticity results of 7-9 kPa. Please note that abnormal hepatic elasticity and shear wave velocities may also be identified in clinical settings other than with hepatic fibrosis, such as: acute hepatitis, elevated right heart and central venous pressures including use of beta blockers, veno-occlusive disease (Budd-Chiari), infiltrative processes such as mastocytosis/amyloidosis/infiltrative tumor/lymphoma, extrahepatic cholestasis, with hyperemia in the post-prandial state, and with liver transplantation. Correlation with patient history, laboratory data, and clinical condition recommended. Diagnostic Categories: < or =5 kPa: high probability of being normal < or =9 kPa: in the absence of other known clinical signs, rules out cACLD >9 kPa and ?13 kPa: suggestive of cACLD, but needs further testing >13 kPa: highly suggestive of cACLD > or =17 kPa: highly suggestive of cACLD with an increased probability of clinically significant portal hypertension IMPRESSION: ULTRASOUND ABDOMEN: Heterogeneous echotexture  and subtle nodularity of the contours of the liver suggests early cirrhosis. ULTRASOUND HEPATIC ELASTOGRAPHY: Median kPa:  2.4 Diagnostic category:  < or = 5 kPa: high probability of being normal Electronically Signed   By: Charlett Nose M.D.   On: 06/18/2022 10:31   MR Abdomen W Wo Contrast  Result Date: 05/25/2022 CLINICAL DATA:  Evaluate renal lesion identified by prior CT EXAM: MRI ABDOMEN WITHOUT AND WITH CONTRAST TECHNIQUE: Multiplanar multisequence MR imaging of the abdomen was performed both before and after the administration of intravenous contrast. CONTRAST:  7.44mL GADAVIST GADOBUTROL 1 MMOL/ML IV SOLN COMPARISON:  CT chest abdomen pelvis, 04/26/2022 FINDINGS: Lower chest: No acute abnormality. Hepatobiliary: No focal liver abnormality is seen. Minimally coarse contour of the liver. Hepatic signal inversion on in and opposed phase imaging as well as intrinsic T2 hypointensity consistent with hepatic iron deposition. Status post cholecystectomy. Unchanged postoperative biliary dilatation. Pancreas: Unremarkable. No pancreatic ductal dilatation or surrounding inflammatory changes. Spleen: Normal in size without significant abnormality. Adrenals/Urinary Tract: Small definitively benign macroscopic fat containing left adrenal adenoma, for which no further follow-up or characterization is required. Normal right adrenal gland. Intrinsically T1 hyperintense lesion of the lateral midportion of the right kidney measuring 1.5 x 1.4 cm with no associated contrast enhancement (series 9, image 37). Kidneys are otherwise normal, without obvious renal calculi, solid lesion, or hydronephrosis. Stomach/Bowel: Stomach is within normal limits. No evidence of bowel wall thickening, distention, or inflammatory changes. Descending and sigmoid diverticulosis. Vascular/Lymphatic: No significant vascular findings are present. No enlarged abdominal lymph nodes. Other: No abdominal wall hernia or abnormality. No ascites.  Musculoskeletal: No acute or significant osseous findings. IMPRESSION: 1. Lesion of the lateral midportion of the right kidney queried by prior CT is consistent with a definitively benign hemorrhagic or proteinaceous cyst. No further follow-up or characterization is required for this benign lesion. 2. Minimally coarse contour of the liver with evidence of hepatic iron deposition. Findings may reflect early cirrhosis. No focal liver lesion. 3. Status post cholecystectomy. Unchanged postoperative biliary dilatation. 4. Diverticulosis without evidence of acute diverticulitis. Electronically Signed   By: Jearld Lesch M.D.   On: 05/25/2022 22:25    ASSESSMENT & PLAN NIKIYAH GONSALES 67 y.o. female with medical history significant for extensive stage small cell lung cancer who presents for a follow up visit.   After review of the labs, review of  the records, and discussion with the patient the patients findings are most consistent with extensive stage small cell lung cancer with metastasis from the right lower lobe to the lymph nodes of the abdomen.  At this time we will pursue triple therapy with carboplatin, etoposide, and atezolizumab.  After 4 cycles we will convert to maintenance atezolizumab alone.  We previously discussed the risks and benefits of this therapy and the patient was in agreement to proceed with this treatment.  The treatment of choice consist of carboplatin, etoposide, and atezolizumab.  The regimen consists of carboplatin AUC of 5 IV on day 1, etoposide 100 mg per metered squared IV on day 1, 2, and 3 and atezolizumab 1200 mg on day 1.  This continues for 21-day cycles.  After 4 cycles the patient proceeds with atezolizumab maintenance therapy alone.    # Small Cell Lung Cancer, Extensive Stage -- MRI of the brain shows no evidence of intracranial spread --Findings are currently consistent with metastatic small cell lung cancer with metastatic spread to the lymph nodes of the abdomen --  Started carboplatin, etoposide, and atezolizumab on 11/13/2020. Transitioned to maintenance Atezolizumab on 02/22/2021.  -- Most recent CT CAP from 04/26/2022 which shows no evidence of recurrence.  Plan: --today is Cycle 26 Day 1 of Atezolizumab maintenance  --labs today were reviewed and adequate for treatment. Hgb 12.5, white blood cell 8.7, MCV 82.6, and platelets of 194.  Creatinine 0.98 with normal LFTs. --patient is willing and able to proceed with treatment at this time.  --RTC in 3 weeks for a follow up before Cycle 27.   # Early Cirrhosis --noted on MR abdomen from 05/22/2022. [place] with elastography performed on 06/18/2022. --continue to follow with GI.   #Right renal lesion: --Noted to be benign on most recent MRI of the abdomen.  #Diarrhea-improved --stool sent for C. Diff and GI pathology panel, all negative --patient following with GI and found to have pancreatic insufficiency and started on creon with improvement of symptoms.   #Nausea: --Symptoms improved with zofran and olanzapine.  --Added IV zofran to infusion premeds for better symptom control.   #Hypokalemia:  --currently taking potassium chloride 20 meq BID --potassium level is still 3.6 today. Continue PO supplementation.   #Lower extremity edema --Currently takes lasix 20 mg 1-2 times a day. --Stable. Continue to monitor.   #Neuropathy involving fingers/feet: --Patient currently takes Cymbalta   #Supportive Care -- chemotherapy education complete -- port placed -- zofran 8mg  q8H PRN and compazine 10mg  PO q6H for nausea -- EMLA cream for port  Orders Placed This Encounter  Procedures   Iron and Iron Binding Capacity ([facility] only)    Standing Status:   Future    Standing Expiration Date:   06/19/2023   TSH    Standing Status:   Future    Standing Expiration Date:   08/22/2023   T4    Standing Status:   Future    Standing Expiration Date:   08/22/2023   CBC with Differential ([name] Only)     Standing Status:   Future    Standing Expiration Date:   08/22/2023   CMP ([name] only)    Standing Status:   Future    Standing Expiration Date:   08/22/2023   TSH    Standing Status:   Future    Standing Expiration Date:   09/12/2023   T4    Standing Status:   Future    Standing Expiration Date:  09/12/2023   CBC with Differential ([name] Only)    Standing Status:   Future    Standing Expiration Date:   09/12/2023   CMP ([name] only)    Standing Status:   Future    Standing Expiration Date:   09/12/2023   TSH    Standing Status:   Future    Standing Expiration Date:   10/03/2023   T4    Standing Status:   Future    Standing Expiration Date:   10/03/2023   CBC with Differential ([name] Only)    Standing Status:   Future    Standing Expiration Date:   10/03/2023   CMP ([name] only)    Standing Status:   Future    Standing Expiration Date:   10/03/2023   All questions were answered. The patient knows to call the clinic with any problems, questions or concerns.  I have spent a total of 30 minutes minutes of face-to-face and non-face-to-face time, preparing to see the patient, performing a medically appropriate examination, counseling and educating the patient, ordering medications/tests, documenting clinical information in the electronic health record, and care coordination.   Ulysees Barns, MD Department of Hematology/Oncology [name] Phone: [phone] Pager: [phone] Email: [email or web address]  06/19/2022 10:09 AM

## 2022-06-19 NOTE — Patient Instructions (Signed)
[facility]  Discharge Instructions: Thank you for choosing [facility] to provide your oncology and hematology care.   If you have a lab appointment with the Cancer Center, please go directly to the Cancer Center and check in at the registration area.   Wear comfortable clothing and clothing appropriate for easy access to any Portacath or PICC line.   We strive to give you quality time with your provider. You may need to reschedule your appointment if you arrive late (15 or more minutes).  Arriving late affects you and other patients whose appointments are after yours.  Also, if you miss three or more appointments without notifying the office, you may be dismissed from the clinic at the provider's discretion.      For prescription refill requests, have your pharmacy contact our office and allow 72 hours for refills to be completed.    Today you received the following chemotherapy and/or immunotherapy agents Tecentriq      To help prevent nausea and vomiting after your treatment, we encourage you to take your nausea medication as directed.  BELOW ARE SYMPTOMS THAT SHOULD BE REPORTED IMMEDIATELY: *FEVER GREATER THAN 100.4 F (38 C) OR HIGHER *CHILLS OR SWEATING *NAUSEA AND VOMITING THAT IS NOT CONTROLLED WITH YOUR NAUSEA MEDICATION *UNUSUAL SHORTNESS OF BREATH *UNUSUAL BRUISING OR BLEEDING *URINARY PROBLEMS (pain or burning when urinating, or frequent urination) *BOWEL PROBLEMS (unusual diarrhea, constipation, pain near the anus) TENDERNESS IN MOUTH AND THROAT WITH OR WITHOUT PRESENCE OF ULCERS (sore throat, sores in mouth, or a toothache) UNUSUAL RASH, SWELLING OR PAIN  UNUSUAL VAGINAL DISCHARGE OR ITCHING   Items with * indicate a potential emergency and should be followed up as soon as possible or go to the Emergency Department if any problems should occur.  Please show the CHEMOTHERAPY ALERT CARD or IMMUNOTHERAPY ALERT CARD at  check-in to the Emergency Department and triage nurse.  Should you have questions after your visit or need to cancel or reschedule your appointment, please contact [facility]  Dept: [phone]  and follow the prompts.  Office hours are 8:00 a.m. to 4:30 p.m. Monday - Friday. Please note that voicemails left after 4:00 p.m. may not be returned until the following business day.  We are closed weekends and major holidays. You have access to a nurse at all times for urgent questions. Please call the main number to the clinic Dept: [phone] and follow the prompts.   For any non-urgent questions, you may also contact your provider using MyChart. We now offer e-Visits for anyone 18 and older to request care online for non-urgent symptoms. For details visit [email or web address].   Also download the MyChart app! Go to the app store, search "MyChart", open the app, select [facility], and log in with your MyChart username and password. 

## 2022-06-20 ENCOUNTER — Other Ambulatory Visit: Payer: Self-pay | Admitting: Nurse Practitioner

## 2022-06-20 NOTE — Telephone Encounter (Signed)
Colleen, please advise. 

## 2022-06-21 LAB — T4: T4, Total: 10.3 ug/dL (ref 4.5–12.0)

## 2022-06-24 ENCOUNTER — Other Ambulatory Visit: Payer: Self-pay | Admitting: Nurse Practitioner

## 2022-06-24 DIAGNOSIS — K8689 Other specified diseases of pancreas: Secondary | ICD-10-CM

## 2022-06-24 NOTE — Progress Notes (Unsigned)
Cardiology Clinic Note   Date: 06/26/2022 ID: [name], DOB January 16, 1956, MRN [id number]  Primary Cardiologist:  Reatha Harps, MD  Patient Profile    [name] is a 67 y.o. female who presents to the clinic today for routine follow-up.  Past medical history significant for: Nonobstructive CAD. LHC 10/31/2015 (abnormal stress test): Angiographically no significant CAD with relatively minimal inferior wall perfusion with a small caliber PDA and no significant posterolateral branching.  False positive stress test. Coronary CTA 09/21/2020: Calcium score 500 (96 percentile).  Minimal (<25%) plaque in LM, LAD, and LCx. Hypertension. Hyperlipidemia. Lipid panel 11/30/2019: LDL 27, HDL 32, TG 193, total 98. Lower extremity pain. ABIs 09/20/2020: No evidence of significant arterial disease bilateral lower extremities.  Abnormal TBI on the left. COPD. Small cell lung cancer. OSA. Hypothyroidism. T2DM. Tobacco abuse.   History of Present Illness    [name] was first seen by Dr. Flora Lipps on 09/13/2020 for chest pain at the request of Dr. Everardo All.  Patient reported intermittent chest pain described as pressure occurring during stressful situations and not increased with exertion.  She also reported bilateral lower extremity pain with absent pulses and right lower extremity and poor pulses in left lower extremity.  Discomfort described as cramping.  Coronary CTA showed calcium score of 500 with minimal plaque in LM, LAD and LCx.  ABIs did not show arterial disease.  Today, patient is here alone.  She reports exertional central chest pain that radiates to her right side described as throbbing with associated shortness of breath.  Shortness of breath resolves with results slowly with rest.  This pain can come on with simple exertion such as sweeping and laundry.  She has also noticed symptoms when she is under high amount of stress.  This pain is similar to pain she had back in 2022  but is significantly more intense.  She has a history of small cell lung cancer that was diagnosed in September 2022.  She finished chemo in January 2023 and is now undergoing immunotherapy.  She was recently started on medication secondary to some problems with her pancreas.  She also has nonalcoholic cirrhosis.  She continues to smoke heavily, 2.5 packs/day.  She reports managing lower extremity edema with Lasix.  She denies palpitations.     ROS: All other systems reviewed and are otherwise negative except as noted in History of Present Illness.  Studies Reviewed    ECG personally reviewed by me today: NSR, 92 bpm.  No significant changes from 12/19/2020.       Physical Exam    VS:  BP 118/64   Pulse 92   Ht 5' 2.5" (1.588 m)   Wt 191 lb (86.6 kg)   SpO2 100%   BMI 34.38 kg/m  , BMI Body mass index is 34.38 kg/m.  GEN: Well nourished, well developed, in no acute distress. Neck: No JVD or carotid bruits. Cardiac:  RRR. No murmurs. No rubs or gallops.   Respiratory:  Respirations regular and unlabored. Clear to auscultation without rales, wheezing or rhonchi. GI: Soft, nontender, nondistended. Extremities: Radials/DP/PT 2+ and equal bilaterally. No clubbing or cyanosis. No edema.  Skin: Warm and dry, no rash. Neuro: Strength intact.  Assessment & Plan    Nonobstructive CAD/DOE/chest pain.  Calcium score 500 with minimal plaque in LM, LAD, LCx.  Patient reports centralized chest pain brought on with light exertion such as sweeping or doing laundry.  Pain is described as throbbing in the  center of her chest radiating to the right side with accompanying shortness of breath that resolves with rest.  Shortness of breath will improve with inhalers.  She reports the same is the same pain she had in 2022 but significantly more intense.  Will repeat coronary CTA to evaluate.  Will also get echo for DOE.  Continue atorvastatin. Hypertension. BP today 118/64.  Patient denies headaches,  dizziness or vision changes. Continue Maxzide. Tobacco abuse.  Patient is a heavy smoker, 2.5 packs/day.  Discussed strategies to begin cutting back and patient is very willing to try.  She requests a nicotine patch which she is prescribed today.  Disposition: Coronary CTA, echo.         Signed, Etta Grandchild. Vivianne Carles, DNP, NP-C

## 2022-06-25 NOTE — Telephone Encounter (Signed)
I have spoken with patient. She states that she actually got labwork completed with oncology. She has no further questions or concerns.

## 2022-06-26 ENCOUNTER — Encounter: Payer: Self-pay | Admitting: Student

## 2022-06-26 ENCOUNTER — Ambulatory Visit: Payer: 59 | Attending: Student | Admitting: Student

## 2022-06-26 VITALS — BP 118/64 | HR 92 | Ht 62.5 in | Wt 191.0 lb

## 2022-06-26 DIAGNOSIS — I10 Essential (primary) hypertension: Secondary | ICD-10-CM

## 2022-06-26 DIAGNOSIS — Z01812 Encounter for preprocedural laboratory examination: Secondary | ICD-10-CM | POA: Diagnosis not present

## 2022-06-26 DIAGNOSIS — R0609 Other forms of dyspnea: Secondary | ICD-10-CM

## 2022-06-26 DIAGNOSIS — R072 Precordial pain: Secondary | ICD-10-CM

## 2022-06-26 DIAGNOSIS — I2510 Atherosclerotic heart disease of native coronary artery without angina pectoris: Secondary | ICD-10-CM

## 2022-06-26 DIAGNOSIS — H353112 Nonexudative age-related macular degeneration, right eye, intermediate dry stage: Secondary | ICD-10-CM | POA: Diagnosis not present

## 2022-06-26 DIAGNOSIS — H04123 Dry eye syndrome of bilateral lacrimal glands: Secondary | ICD-10-CM | POA: Diagnosis not present

## 2022-06-26 DIAGNOSIS — H2513 Age-related nuclear cataract, bilateral: Secondary | ICD-10-CM | POA: Diagnosis not present

## 2022-06-26 DIAGNOSIS — H353221 Exudative age-related macular degeneration, left eye, with active choroidal neovascularization: Secondary | ICD-10-CM | POA: Diagnosis not present

## 2022-06-26 DIAGNOSIS — Z720 Tobacco use: Secondary | ICD-10-CM

## 2022-06-26 MED ORDER — NICOTINE 21 MG/24HR TD PT24: 21.0000 mg | MEDICATED_PATCH | Freq: Every day | TRANSDERMAL | 1 refills | Status: DC

## 2022-06-26 MED ORDER — METOPROLOL TARTRATE 100 MG PO TABS: ORAL_TABLET | ORAL | 0 refills | Status: DC

## 2022-06-26 NOTE — Patient Instructions (Addendum)
Medication Instructions:  Your physician recommends that you continue on your current medications as directed. Please refer to the Current Medication list given to you today.  *If you need a refill on your cardiac medications before your next appointment, please call your pharmacy*   Lab Work: NONE If you have labs (blood work) drawn today and your tests are completely normal, you will receive your results only by: MyChart Message (if you have MyChart) OR A paper copy in the mail If you have any lab test that is abnormal or we need to change your treatment, we will call you to review the results.   Testing/Procedures: Your physician has requested that you have an echocardiogram. Echocardiography is a painless test that uses sound waves to create images of your heart. It provides your doctor with information about the size and shape of your heart and how well your heart's chambers and valves are working. This procedure takes approximately one hour. There are no restrictions for this procedure. Please do NOT wear cologne, perfume, aftershave, or lotions (deodorant is allowed). Please arrive 15 minutes prior to your appointment time.  Cardiac CT Angiography (CTA), is a special type of CT scan that uses a computer to produce multi-dimensional views of major blood vessels throughout the body. In CT angiography, a contrast material is injected through an IV to help visualize the blood vessels     Follow-Up: At [facility], you and your health needs are our priority.  As part of our continuing mission to provide you with exceptional heart care, we have created designated Provider Care Teams.  These Care Teams include your primary Cardiologist (physician) and Advanced Practice Providers (APPs -  Physician Assistants and Nurse Practitioners) who all work together to provide you with the care you need, when you need it.  We recommend signing up for the patient portal called "MyChart".  Sign  up information is provided on this After Visit Summary.  MyChart is used to connect with patients for Virtual Visits (Telemedicine).  Patients are able to view lab/test results, encounter notes, upcoming appointments, etc.  Non-urgent messages can be sent to your provider as well.   To learn more about what you can do with MyChart, go to [email or web address].    Your next appointment:   6-8 week(s)  Provider:   Carlos Levering, NP   Other Instructions   Your cardiac CT will be scheduled at one of the below locations:   [facility] [address] [place], [place] [address] [phone]  If scheduled at [facility], please arrive at the [facility] and Children's Entrance (Entrance C2) of [facility] 30 minutes prior to test start time. You can use the FREE valet parking offered at entrance C (encouraged to control the heart rate for the test)  Proceed to the [facility] Radiology Department (first floor) to check-in and test prep.  All radiology patients and guests should use entrance C2 at [facility], accessed from [facility], even though the hospital's physical address listed is [address].     Please follow these instructions carefully (unless otherwise directed):  Hold all erectile dysfunction medications at least 3 days (72 hrs) prior to test. (Ie viagra, cialis, sildenafil, tadalafil, etc) We will administer nitroglycerin during this exam.   On the Night Before the Test: Be sure to Drink plenty of water. Do not consume any caffeinated/decaffeinated beverages or chocolate 12 hours prior to your test. Do not take  any antihistamines 12 hours prior to your test.  On the Day of the Test: Drink plenty of water until 1 hour prior to the test. Do not eat any food 1 hour prior to test. You may take your regular medications prior to the test.  Take metoprolol (Lopressor) 100mg  two hours prior to test. If you take  Furosemide/Hydrochlorothiazide/Spironolactone, please HOLD on the morning of the test. FEMALES- please wear underwire-free bra if available, avoid dresses & tight clothing       After the Test: Drink plenty of water. After receiving IV contrast, you may experience a mild flushed feeling. This is normal. On occasion, you may experience a mild rash up to 24 hours after the test. This is not dangerous. If this occurs, you can take Benadryl 25 mg and increase your fluid intake. If you experience trouble breathing, this can be serious. If it is severe call 911 IMMEDIATELY. If it is mild, please call our office. If you take any of these medications: Glipizide/Metformin, Avandament, Glucavance, please do not take 48 hours after completing test unless otherwise instructed.  We will call to schedule your test 2-4 weeks out understanding that some insurance companies will need an authorization prior to the service being performed.   For non-scheduling related questions, please contact the cardiac imaging nurse navigator should you have any questions/concerns: Rockwell [place], Cardiac Imaging Nurse Navigator Larey Brick, Cardiac Imaging Nurse Navigator [facility] Direct Office Dial: [phone]   For scheduling needs, including cancellations and rescheduling, please call [place], [phone].

## 2022-07-04 ENCOUNTER — Telehealth: Payer: Self-pay | Admitting: *Deleted

## 2022-07-04 NOTE — Telephone Encounter (Signed)
Oral swab drug screen was consistent for prescribed medications.  ?

## 2022-07-05 ENCOUNTER — Encounter: Payer: Self-pay | Admitting: Physical Medicine and Rehabilitation

## 2022-07-09 ENCOUNTER — Telehealth (HOSPITAL_COMMUNITY): Payer: Self-pay | Admitting: Emergency Medicine

## 2022-07-09 ENCOUNTER — Encounter: Payer: Self-pay | Admitting: Dermatology

## 2022-07-09 NOTE — Telephone Encounter (Signed)
Reaching out to patient to offer assistance regarding upcoming cardiac imaging study; pt verbalizes understanding of appt date/time, parking situation and where to check in, pre-test NPO status and medications ordered, and verified current allergies; name and call back number provided for further questions should they arise Jaray Boliver RN Navigator Cardiac Imaging [facility] [phone] office [phone] cell 

## 2022-07-10 ENCOUNTER — Ambulatory Visit (HOSPITAL_COMMUNITY)
Admission: RE | Admit: 2022-07-10 | Discharge: 2022-07-10 | Disposition: A | Discharge status: Home / Self Care | Payer: 59 | Source: Ambulatory Visit | Attending: Student | Admitting: Student

## 2022-07-10 DIAGNOSIS — R072 Precordial pain: Secondary | ICD-10-CM | POA: Insufficient documentation

## 2022-07-10 MED ORDER — NITROGLYCERIN 0.4 MG SL SUBL
SUBLINGUAL_TABLET | SUBLINGUAL | Status: AC
  Filled 2022-07-10: qty 2

## 2022-07-10 MED ORDER — IOHEXOL 350 MG/ML SOLN
100.0000 mL | Freq: Once | INTRAVENOUS | Status: AC | PRN
  Administered 2022-07-10: 100 mL via INTRAVENOUS

## 2022-07-10 MED ORDER — NITROGLYCERIN 0.4 MG SL SUBL
0.8000 mg | SUBLINGUAL_TABLET | SUBLINGUAL | Status: DC | PRN
  Administered 2022-07-10: 0.8 mg via SUBLINGUAL

## 2022-07-11 ENCOUNTER — Inpatient Hospital Stay (HOSPITAL_BASED_OUTPATIENT_CLINIC_OR_DEPARTMENT_OTHER): Payer: 59 | Admitting: Hematology and Oncology

## 2022-07-11 ENCOUNTER — Other Ambulatory Visit: Payer: Self-pay

## 2022-07-11 ENCOUNTER — Inpatient Hospital Stay: Payer: 59

## 2022-07-11 ENCOUNTER — Inpatient Hospital Stay: Payer: 59 | Attending: Hematology and Oncology

## 2022-07-11 VITALS — BP 124/69 | HR 71 | Temp 97.3°F | Resp 13 | Wt 185.9 lb

## 2022-07-11 VITALS — BP 111/63 | HR 65 | Temp 98.0°F | Resp 17

## 2022-07-11 DIAGNOSIS — J449 Chronic obstructive pulmonary disease, unspecified: Secondary | ICD-10-CM | POA: Insufficient documentation

## 2022-07-11 DIAGNOSIS — Z825 Family history of asthma and other chronic lower respiratory diseases: Secondary | ICD-10-CM | POA: Insufficient documentation

## 2022-07-11 DIAGNOSIS — K219 Gastro-esophageal reflux disease without esophagitis: Secondary | ICD-10-CM | POA: Insufficient documentation

## 2022-07-11 DIAGNOSIS — Z90721 Acquired absence of ovaries, unilateral: Secondary | ICD-10-CM | POA: Diagnosis not present

## 2022-07-11 DIAGNOSIS — F32A Depression, unspecified: Secondary | ICD-10-CM | POA: Insufficient documentation

## 2022-07-11 DIAGNOSIS — Z885 Allergy status to narcotic agent status: Secondary | ICD-10-CM | POA: Insufficient documentation

## 2022-07-11 DIAGNOSIS — E119 Type 2 diabetes mellitus without complications: Secondary | ICD-10-CM | POA: Diagnosis not present

## 2022-07-11 DIAGNOSIS — Z888 Allergy status to other drugs, medicaments and biological substances status: Secondary | ICD-10-CM | POA: Diagnosis not present

## 2022-07-11 DIAGNOSIS — Z8719 Personal history of other diseases of the digestive system: Secondary | ICD-10-CM | POA: Diagnosis not present

## 2022-07-11 DIAGNOSIS — E039 Hypothyroidism, unspecified: Secondary | ICD-10-CM | POA: Diagnosis not present

## 2022-07-11 DIAGNOSIS — Z79620 Long term (current) use of immunosuppressive biologic: Secondary | ICD-10-CM | POA: Diagnosis not present

## 2022-07-11 DIAGNOSIS — Z79621 Long term (current) use of calcineurin inhibitor: Secondary | ICD-10-CM | POA: Diagnosis not present

## 2022-07-11 DIAGNOSIS — Z90710 Acquired absence of both cervix and uterus: Secondary | ICD-10-CM | POA: Diagnosis not present

## 2022-07-11 DIAGNOSIS — C3490 Malignant neoplasm of unspecified part of unspecified bronchus or lung: Secondary | ICD-10-CM

## 2022-07-11 DIAGNOSIS — Z5112 Encounter for antineoplastic immunotherapy: Secondary | ICD-10-CM | POA: Insufficient documentation

## 2022-07-11 DIAGNOSIS — C3431 Malignant neoplasm of lower lobe, right bronchus or lung: Secondary | ICD-10-CM | POA: Insufficient documentation

## 2022-07-11 DIAGNOSIS — Z95828 Presence of other vascular implants and grafts: Secondary | ICD-10-CM

## 2022-07-11 DIAGNOSIS — Z79899 Other long term (current) drug therapy: Secondary | ICD-10-CM | POA: Insufficient documentation

## 2022-07-11 DIAGNOSIS — F17210 Nicotine dependence, cigarettes, uncomplicated: Secondary | ICD-10-CM | POA: Diagnosis not present

## 2022-07-11 DIAGNOSIS — Z8249 Family history of ischemic heart disease and other diseases of the circulatory system: Secondary | ICD-10-CM | POA: Insufficient documentation

## 2022-07-11 DIAGNOSIS — I10 Essential (primary) hypertension: Secondary | ICD-10-CM | POA: Insufficient documentation

## 2022-07-11 DIAGNOSIS — Z886 Allergy status to analgesic agent status: Secondary | ICD-10-CM | POA: Diagnosis not present

## 2022-07-11 DIAGNOSIS — Z811 Family history of alcohol abuse and dependence: Secondary | ICD-10-CM | POA: Insufficient documentation

## 2022-07-11 DIAGNOSIS — Z814 Family history of other substance abuse and dependence: Secondary | ICD-10-CM | POA: Insufficient documentation

## 2022-07-11 DIAGNOSIS — Z880 Allergy status to penicillin: Secondary | ICD-10-CM | POA: Insufficient documentation

## 2022-07-11 DIAGNOSIS — F419 Anxiety disorder, unspecified: Secondary | ICD-10-CM | POA: Diagnosis not present

## 2022-07-11 DIAGNOSIS — Z882 Allergy status to sulfonamides status: Secondary | ICD-10-CM | POA: Diagnosis not present

## 2022-07-11 DIAGNOSIS — E785 Hyperlipidemia, unspecified: Secondary | ICD-10-CM | POA: Insufficient documentation

## 2022-07-11 DIAGNOSIS — Z8261 Family history of arthritis: Secondary | ICD-10-CM | POA: Insufficient documentation

## 2022-07-11 DIAGNOSIS — Z9049 Acquired absence of other specified parts of digestive tract: Secondary | ICD-10-CM | POA: Insufficient documentation

## 2022-07-11 DIAGNOSIS — Z823 Family history of stroke: Secondary | ICD-10-CM | POA: Insufficient documentation

## 2022-07-11 DIAGNOSIS — Z820 Family history of epilepsy and other diseases of the nervous system: Secondary | ICD-10-CM | POA: Insufficient documentation

## 2022-07-11 LAB — CMP (CANCER CENTER ONLY)
ALT: 7 U/L (ref 0–44)
AST: 11 U/L — ABNORMAL LOW (ref 15–41)
Albumin: 3.6 g/dL (ref 3.5–5.0)
Alkaline Phosphatase: 68 U/L (ref 38–126)
Anion gap: 6 (ref 5–15)
BUN: 12 mg/dL (ref 8–23)
CO2: 26 mmol/L (ref 22–32)
Calcium: 8.7 mg/dL — ABNORMAL LOW (ref 8.9–10.3)
Chloride: 99 mmol/L (ref 98–111)
Creatinine: 1.02 mg/dL — ABNORMAL HIGH (ref 0.44–1.00)
GFR, Estimated: >60 mL/min (ref >60)
Glucose, Bld: 88 mg/dL (ref 70–99)
Potassium: 3.8 mmol/L (ref 3.5–5.1)
Sodium: 131 mmol/L — ABNORMAL LOW (ref 135–145)
Total Bilirubin: 0.4 mg/dL (ref 0.3–1.2)
Total Protein: 6.7 g/dL (ref 6.5–8.1)

## 2022-07-11 LAB — CBC WITH DIFFERENTIAL (CANCER CENTER ONLY)
Abs Immature Granulocytes: 0.05 10*3/uL (ref 0.00–0.07)
Basophils Absolute: 0.1 10*3/uL (ref 0.0–0.1)
Basophils Relative: 1 %
Eosinophils Absolute: 0.6 10*3/uL — ABNORMAL HIGH (ref 0.0–0.5)
Eosinophils Relative: 6 %
HCT: 41.4 % (ref 36.0–46.0)
Hemoglobin: 13.4 g/dL (ref 12.0–15.0)
Immature Granulocytes: 1 %
Lymphocytes Relative: 28 %
Lymphs Abs: 2.6 10*3/uL (ref 0.7–4.0)
MCH: 27.2 pg (ref 26.0–34.0)
MCHC: 32.4 g/dL (ref 30.0–36.0)
MCV: 84.1 fL (ref 80.0–100.0)
Monocytes Absolute: 0.6 10*3/uL (ref 0.1–1.0)
Monocytes Relative: 6 %
Neutro Abs: 5.4 10*3/uL (ref 1.7–7.7)
Neutrophils Relative %: 58 %
Platelet Count: 212 10*3/uL (ref 150–400)
RBC: 4.92 MIL/uL (ref 3.87–5.11)
RDW: 20.4 % — ABNORMAL HIGH (ref 11.5–15.5)
WBC Count: 9.3 10*3/uL (ref 4.0–10.5)
nRBC: 0 % (ref 0.0–0.2)

## 2022-07-11 LAB — TSH: TSH: 0.208 u[IU]/mL — ABNORMAL LOW (ref 0.350–4.500)

## 2022-07-11 MED ORDER — SODIUM CHLORIDE 0.9% FLUSH
10.0000 mL | INTRAVENOUS | Status: DC | PRN
  Administered 2022-07-11: 10 mL

## 2022-07-11 MED ORDER — HEPARIN SOD (PORK) LOCK FLUSH 100 UNIT/ML IV SOLN
500.0000 [IU] | Freq: Once | INTRAVENOUS | Status: AC | PRN
  Administered 2022-07-11: 500 [IU]

## 2022-07-11 MED ORDER — SODIUM CHLORIDE 0.9% FLUSH
10.0000 mL | Freq: Once | INTRAVENOUS | Status: AC
  Administered 2022-07-11: 10 mL

## 2022-07-11 MED ORDER — SODIUM CHLORIDE 0.9 % IV SOLN: Freq: Once | INTRAVENOUS | Status: AC

## 2022-07-11 MED ORDER — ONDANSETRON HCL 4 MG/2ML IJ SOLN
8.0000 mg | Freq: Once | INTRAMUSCULAR | Status: AC
  Administered 2022-07-11: 8 mg via INTRAVENOUS
  Filled 2022-07-11: qty 4

## 2022-07-11 MED ORDER — SODIUM CHLORIDE 0.9 % IV SOLN
1200.0000 mg | Freq: Once | INTRAVENOUS | Status: AC
  Administered 2022-07-11: 1200 mg via INTRAVENOUS
  Filled 2022-07-11: qty 20

## 2022-07-11 NOTE — Patient Instructions (Signed)
[facility]  Discharge Instructions: Thank you for choosing [facility] to provide your oncology and hematology care.   If you have a lab appointment with the Cancer Center, please go directly to the Cancer Center and check in at the registration area.   Wear comfortable clothing and clothing appropriate for easy access to any Portacath or PICC line.   We strive to give you quality time with your provider. You may need to reschedule your appointment if you arrive late (15 or more minutes).  Arriving late affects you and other patients whose appointments are after yours.  Also, if you miss three or more appointments without notifying the office, you may be dismissed from the clinic at the provider's discretion.      For prescription refill requests, have your pharmacy contact our office and allow 72 hours for refills to be completed.    Today you received the following chemotherapy and/or immunotherapy agents Tecentriq      To help prevent nausea and vomiting after your treatment, we encourage you to take your nausea medication as directed.  BELOW ARE SYMPTOMS THAT SHOULD BE REPORTED IMMEDIATELY: *FEVER GREATER THAN 100.4 F (38 C) OR HIGHER *CHILLS OR SWEATING *NAUSEA AND VOMITING THAT IS NOT CONTROLLED WITH YOUR NAUSEA MEDICATION *UNUSUAL SHORTNESS OF BREATH *UNUSUAL BRUISING OR BLEEDING *URINARY PROBLEMS (pain or burning when urinating, or frequent urination) *BOWEL PROBLEMS (unusual diarrhea, constipation, pain near the anus) TENDERNESS IN MOUTH AND THROAT WITH OR WITHOUT PRESENCE OF ULCERS (sore throat, sores in mouth, or a toothache) UNUSUAL RASH, SWELLING OR PAIN  UNUSUAL VAGINAL DISCHARGE OR ITCHING   Items with * indicate a potential emergency and should be followed up as soon as possible or go to the Emergency Department if any problems should occur.  Please show the CHEMOTHERAPY ALERT CARD or IMMUNOTHERAPY ALERT CARD at  check-in to the Emergency Department and triage nurse.  Should you have questions after your visit or need to cancel or reschedule your appointment, please contact [facility]  Dept: [phone]  and follow the prompts.  Office hours are 8:00 a.m. to 4:30 p.m. Monday - Friday. Please note that voicemails left after 4:00 p.m. may not be returned until the following business day.  We are closed weekends and major holidays. You have access to a nurse at all times for urgent questions. Please call the main number to the clinic Dept: [phone] and follow the prompts.   For any non-urgent questions, you may also contact your provider using MyChart. We now offer e-Visits for anyone 18 and older to request care online for non-urgent symptoms. For details visit [email or web address].   Also download the MyChart app! Go to the app store, search "MyChart", open the app, select [place] [place], and log in with your MyChart username and password. 

## 2022-07-11 NOTE — Progress Notes (Signed)
[facility] [phone]   [phone]  PROGRESS NOTE  Patient Care Team: Allwardt, Crist Infante, PA-C as PCP - General (Physician Assistant) O'Neal, Ronnald Ramp, MD as PCP - Cardiology (Cardiology) Rachael Fee, MD as Attending Physician (Gastroenterology) Dr. Louie Bun, MD as Consulting Physician (Orthopedic Surgery) Ranee Gosselin, MD as Consulting Physician (Orthopedic Surgery) York Spaniel, MD (Inactive) as Consulting Physician (Neurology) Smitty Cords, OD as Consulting Physician (Optometry) Karel Jarvis Lesle Chris, MD as Consulting Physician (Neurology) Erroll Luna, [facility] (Pharmacist)  Hematological/Oncological History # Small Cell Lung Cancer, Extensive Stage 07/05/2020: CT abdomen for lower abdominal pain. New pulmonary nodular density noted 07/06/2020: CT chest showed 2.2 cm macrolobulated right lower lobe pulmonary nodule (favored) versus pathologically enlarged infrahilar lymph node 10/13/2020: PET CT scan performed, findings show 2 cm right lower lobe lung mass is hypermetabolic and consistent with primary lung neoplasm. Additionally found hypermetabolic 17 mm soft tissue lesion between the descending duodenum and the pancreatic head  10/19/2020: EGD to biopsy hypermetabolic lymph node. Biopsy results show small cell lung cancer 10/26/2020: establish care with Dr. Leonides Schanz  11/13/2020: Cycle 1 Day 1 of Carbo/Etop/Atezolizumab 12/04/2020: Cycle 2 Day 1 of Carbo/Etop/Atezolizumab 11/22-11/25/2022: admitted for E. Coli bacteremia/sepsis. Start of Cycle 3 delayed. 01/02/2021: Cycle 3 Day 1 of Carbo/Etop/Atezolizumab 01/23/2021: Cycle 4 Day 1 of Carbo/Etop/Atezolizumab 02/22/2021: Cycle 5 Day 1 of Atezolizumab Maintenance. Delayed start due to patient's COVID infection.  03/14/2021: Cycle 6 Day 1 of Atezolizumab Maintenance 04/05/2021: Cycle 7 Day 1 of Atezolizumab Maintenance 04/25/2021: Cycle 8 Day 1 of Atezolizumab  Maintenance 05/16/2021: Cycle 9 Day 1 of Atezolizumab Maintenance 06/06/2021: Cycle 10 Day 1 of Atezolizumab Maintenance 06/27/2021:  Cycle 11 Day 1 of Atezolizumab Maintenance 07/18/2021: Cycle 12 Day 1 of Atezolizumab Maintenance 08/08/2021: Cycle 13 Day 1 of Atezolizumab Maintenance 08/29/2021: Cycle 14 Day 1 of Atezolizumab Maintenance 09/19/2021: Cycle 15 Day 1 of Atezolizumab Maintenance 10/10/2021: Cycle 16 Day 1 of Atezolizumab Maintenance 10/31/2021: Cycle 17 Day 1 of Atezolizumab Maintenance 11/21/2021: Cycle 18 Day 1 of Atezolizumab Maintenance 12/12/2021: Cycle 19 Day 1 of Atezolizumab Maintenance 01/02/2022: Cycle 20 Day 1 of Atezolizumab Maintenance 01/23/2022: Cycle 21 Day 1 of Atezolizumab Maintenance 02/13/2022: treatment HELD due to diarrhea.  03/06/2022: Cycle 22 Day 1 of Atezolizumab Maintenance 03/27/2022: Cycle 23 Day 1 of Atezolizumab Maintenance 04/17/2022: Cycle 24 Day 1 of Atezolizumab Maintenance 05/08/2022:  Cycle 25 Day 1 of Atezolizumab Maintenance (Held due to patient preference for treatment holiday) 05/29/2022: Cycle 25 Day 1 of Atezolizumab Maintenance  06/19/2022: Cycle 26 Day 1 of Atezolizumab Maintenance  07/12/2022: Cycle 27 Day 1 of Atezolizumab Maintenance  Interval History:  [name] 67 y.o. female with medical history significant for extensive stage small cell lung cancer who presents for a follow up visit. The patient's last visit was on 06/19/2022.  She presents today to start cycle 27 of chemotherapy.  On exam today she is accompanied by her husband.  On exam today Mrs. Diallo reports her last cycle went quite well.  She reports that she was "only down for about 2 to 3 days".  She notes that her energy levels are now improved.  She does have some occasional nausea but no vomiting or diarrhea.  She does take her Zofran almost daily.  Her appetite has been poor and she only eats about 1 meal per day.  She reports she has "no desire to eat and nothing sounds  good".  She notes that eats about 2-3  bites and she is done.  She reports that everything else has been relatively steady.  She continues taking her iron pills daily and she notes that Creon is helping with her bowels.  Overall she is willing and able to proceed with maintenance atezolizumab at this time.  She denies fevers, chills, sweats, chest pain or cough. She has no other complaints.A full 10 point ROS is listed below.   MEDICAL HISTORY:  Past Medical History:  Diagnosis Date   Allergic rhinitis    Anemia    Anxiety    Chicken pox    Chronic back pain    COPD (chronic obstructive pulmonary disease) (HCC)    Depression    DM (diabetes mellitus) (HCC)    Essential hypertension    GERD (gastroesophageal reflux disease)    Headache    migraines   History of gastritis    EGD 2015   History of home oxygen therapy    2 liters at hs last 6 months   Hyperlipidemia    Hypothyroidism    Migraines    Osteoarthritis    oa   Scoliosis    Small cell lung cancer (HCC)    Stage 4    SURGICAL HISTORY: Past Surgical History:  Procedure Laterality Date   APPENDECTOMY     1985   BIOPSY  07/24/2017   Procedure: BIOPSY;  Surgeon: Rachael Fee, MD;  Location: [facility];  Service: Endoscopy;;   CARDIAC CATHETERIZATION N/A 10/31/2015   Procedure: Left Heart Cath and Coronary Angiography;  Surgeon: Marykay Lex, MD;  Location: [facility] INVASIVE CV LAB;  Service: Cardiovascular;  Laterality: N/A;   CARPAL TUNNEL RELEASE Left    CARPAL TUNNEL RELEASE Right    CHOLECYSTECTOMY  late 1980's   COLONOSCOPY WITH PROPOFOL N/A 07/24/2017   Procedure: COLONOSCOPY WITH PROPOFOL;  Surgeon: Rachael Fee, MD;  Location: [facility];  Service: Endoscopy;  Laterality: N/A;   ESOPHAGOGASTRODUODENOSCOPY N/A 07/24/2017   Procedure: ESOPHAGOGASTRODUODENOSCOPY (EGD);  Surgeon: Rachael Fee, MD;  Location: [facility];  Service: Endoscopy;  Laterality: N/A;   ESOPHAGOGASTRODUODENOSCOPY (EGD) WITH  PROPOFOL N/A 10/19/2020   Procedure: ESOPHAGOGASTRODUODENOSCOPY (EGD) WITH PROPOFOL;  Surgeon: Rachael Fee, MD;  Location: [facility];  Service: Endoscopy;  Laterality: N/A;   FINE NEEDLE ASPIRATION N/A 10/19/2020   Procedure: FINE NEEDLE ASPIRATION (FNA) LINEAR;  Surgeon: Rachael Fee, MD;  Location: [facility];  Service: Endoscopy;  Laterality: N/A;   GALLBLADDER SURGERY  1991   HIP CLOSED REDUCTION Right 01/08/2016   Procedure: CLOSED MANIPULATION HIP;  Surgeon: Jene Every, MD;  Location: [facility];  Service: Orthopedics;  Laterality: Right;   HIP CLOSED REDUCTION Right 01/19/2016   Procedure: ATTEMPTED CLOSED REDUCTION RIGHT HIP;  Surgeon: Toni Arthurs, MD;  Location: [facility];  Service: Orthopedics;  Laterality: Right;   HIP CLOSED REDUCTION Right 01/20/2016   Procedure: CLOSED REDUCTION RIGHT TOTAL HIP;  Surgeon: Durene Romans, MD;  Location: [facility];  Service: Orthopedics;  Laterality: Right;   HIP CLOSED REDUCTION Right 02/17/2016   Procedure: CLOSED REDUCTION RIGHT TOTAL HIP;  Surgeon: Samson Frederic, MD;  Location: [facility];  Service: Orthopedics;  Laterality: Right;   HIP CLOSED REDUCTION Right 02/28/2016   Procedure: CLOSED REDUCTION HIP;  Surgeon: Yolonda Kida, MD;  Location: [facility];  Service: Orthopedics;  Laterality: Right;   IR IMAGING GUIDED PORT INSERTION  11/01/2020   POLYPECTOMY  07/24/2017   Procedure: POLYPECTOMY;  Surgeon: Rachael Fee, MD;  Location: [facility];  Service: Endoscopy;;   TONSILLECTOMY     TOTAL ABDOMINAL HYSTERECTOMY     1985, with 1 ovary removed and 2 nd ovary removed 2003   TOTAL HIP ARTHROPLASTY Right    Original surgery 2006 with revision 2010   TOTAL HIP REVISION Right 01/01/2016   Procedure: TOTAL HIP REVISION;  Surgeon: Durene Romans, MD;  Location: [facility];  Service: Orthopedics;  Laterality: Right;   TOTAL HIP REVISION Right 03/02/2016   Procedure: TOTAL HIP REVISION;  Surgeon: Durene Romans, MD;  Location: [facility];  Service:  Orthopedics;  Laterality: Right;   TOTAL HIP REVISION Right 09/02/2016   Procedure: Right hip constrained liner- posterior;  Surgeon: Durene Romans, MD;  Location: [facility];  Service: Orthopedics;  Laterality: Right;   ULNAR NERVE TRANSPOSITION Right    UPPER ESOPHAGEAL ENDOSCOPIC ULTRASOUND (EUS) N/A 10/19/2020   Procedure: UPPER ESOPHAGEAL ENDOSCOPIC ULTRASOUND (EUS);  Surgeon: Rachael Fee, MD;  Location: [facility];  Service: Endoscopy;  Laterality: N/A;  periduodenal lesion    SOCIAL HISTORY: Social History   Socioeconomic History   Marital status: Married    Spouse name: Not on file   Number of children: 2   Years of education: Not on file   Highest education level: Not on file  Occupational History   Occupation: disabled   Occupation: disabled  Tobacco Use   Smoking status: Every Day    Packs/day: 2.00    Years: 46.00    Additional pack years: 0.00    Total pack years: 92.00    Types: Cigarettes   Smokeless tobacco: Never   Tobacco comments:    2 packs of cigarettes smoked daily 12/13/21- declines smoking cessation  Vaping Use   Vaping Use: Never used  Substance and Sexual Activity   Alcohol use: No   Drug use: No   Sexual activity: Not Currently    Partners: Male  Other Topics Concern   Not on file  Social History Narrative   Right handed    Caffeine~ 2 cups per day    Lives at home with husband (strained relationship)   Primary caretaker for disabled brother who had aneurism   Daughter died 2018/06/21   Social Determinants of Health   Financial Resource Strain: Low Risk  (05/16/2022)   Overall Financial Resource Strain (CARDIA)    Difficulty of Paying Living Expenses: Not hard at all  Food Insecurity: No Food Insecurity (05/16/2022)   Hunger Vital Sign    Worried About Running Out of Food in the Last Year: Never true    Ran Out of Food in the Last Year: Never true  Transportation Needs: No Transportation Needs (05/16/2022)   PRAPARE - Therapist, art (Medical): No    Lack of Transportation (Non-Medical): No  Physical Activity: Inactive (05/16/2022)   Exercise Vital Sign    Days of Exercise per Week: 0 days    Minutes of Exercise per Session: 0 min  Stress: Stress Concern Present (05/16/2022)   [employer] of Occupational Health - Occupational Stress Questionnaire    Feeling of Stress : To some extent  Social Connections: Moderately Isolated (05/16/2022)   Social Connection and Isolation Panel [NHANES]    Frequency of Communication with Friends and Family: More than three times a week    Frequency of Social Gatherings with Friends and Family: Once a week    Attends Religious Services: Never    Database administrator or Organizations: No  Attends Banker Meetings: Never    Marital Status: Married  Catering manager Violence: Not At Risk (05/16/2022)   Humiliation, Afraid, Rape, and Kick questionnaire    Fear of Current or Ex-Partner: No    Emotionally Abused: No    Physically Abused: No    Sexually Abused: No    FAMILY HISTORY: Family History  Problem Relation Age of Onset   COPD Mother    Heart disease Mother    Lung disease Father        Asbestosis   Heart attack Father    Heart disease Father    Cerebral aneurysm Brother    Aneurysm Brother        Brain   Drug abuse Daughter    Epilepsy Son    Alcohol abuse Son    Drug abuse Son    Arthritis Maternal Grandmother    Heart disease Maternal Grandmother    Asthma Maternal Grandfather    Cancer Maternal Grandfather    Arthritis Paternal Grandmother    Heart disease Paternal Grandmother    Stroke Paternal Grandmother    Early death Paternal Grandfather    Heart disease Paternal Grandfather     ALLERGIES:  is allergic to metformin and related, nsaids, wellbutrin [bupropion], aleve [naproxen sodium], codeine, penicillins, and sulfonamide derivatives.  MEDICATIONS:  Current Outpatient Medications  Medication Sig Dispense  Refill   albuterol (PROAIR HFA) 108 (90 Base) MCG/ACT inhaler 2 puffs every 4 hours as needed only  if your can't catch your breath 18 g 5   ALPRAZolam (XANAX) 1 MG tablet TAKE 1 TABLET BY MOUTH THREE TIMES DAILY AS NEEDED FOR ANXIETY 90 tablet 2   atorvastatin (LIPITOR) 20 MG tablet TAKE 1 TABLET BY MOUTH AT BEDTIME 90 tablet 1   benzonatate (TESSALON) 100 MG capsule Take 1 capsule (100 mg total) by mouth 3 (three) times daily. 90 capsule 1   CREON 36000-114000 units CPEP capsule TAKE TWO CAPSULES BY MOUTH THREE TIMES DAILY WITH MEALS 180 capsule 1   cyclobenzaprine (FLEXERIL) 10 MG tablet Take 10 mg by mouth 3 (three) times daily as needed.     dicyclomine (BENTYL) 10 MG capsule TAKE ONE CAPSULE BY MOUTH TWICE DAILY 60 capsule 1   diphenoxylate-atropine (LOMOTIL) 2.5-0.025 MG tablet Take 1 tablet by mouth 4 (four) times daily as needed for diarrhea or loose stools. 60 tablet 0   DULoxetine (CYMBALTA) 60 MG capsule Take 2 capsules (120 mg total) by mouth daily. 180 capsule 1   ferrous sulfate 325 (65 FE) MG EC tablet Take 1 tablet (325 mg total) by mouth daily with breakfast. 30 tablet 3   fluticasone (FLONASE) 50 MCG/ACT nasal spray INSTILL 2 SPRAYS IN EACH NOSTRIL EVERY DAY 16 g 6   Fluticasone-Umeclidin-Vilant (TRELEGY ELLIPTA) 200-62.5-25 MCG/ACT AEPB Inhale 1 puff into the lungs daily at 2 PM. 60 each 5   furosemide (LASIX) 20 MG tablet TAKE 1 TABLET BY MOUTH TWICE DAILY, IF SWELLING IMPROVES CAN HOLD MEDICATION 90 tablet 0   HYDROcodone-acetaminophen (NORCO) 10-325 MG tablet Take 1 tablet by mouth every 4 (four) hours as needed. Due to be refilled 6/5 150 tablet 0   hydrOXYzine (ATARAX) 10 MG tablet Take 1 tablet (10 mg total) by mouth 3 (three) times daily as needed for itching. 30 tablet 0   levothyroxine (SYNTHROID) 137 MCG tablet Take 1 tablet (137 mcg total) by mouth daily before breakfast. 90 tablet 1   loratadine (CLARITIN) 10 MG tablet Take 10 mg by mouth  daily.     meloxicam (MOBIC)  15 MG tablet TAKE 1 TABLET BY MOUTH EVERY DAY 30 tablet 2   metoprolol tartrate (LOPRESSOR) 100 MG tablet Take 2 hours prior to CT 1 tablet 0   OLANZapine (ZYPREXA) 10 MG tablet Take 1 tablet (10 mg total) by mouth at bedtime. 30 tablet 2   omeprazole (PRILOSEC) 40 MG capsule TAKE ONE CAPSULE BY MOUTH TWICE DAILY 180 capsule 3   ondansetron (ZOFRAN) 8 MG tablet TAKE 1 TABLET BY MOUTH TWICE DAILY 60 tablet 1   permethrin (ELIMITE) 5 % cream      potassium chloride SA (KLOR-CON M) 20 MEQ tablet TAKE 1 TABLET BY MOUTH TWICE DAILY 60 tablet 2   prochlorperazine (COMPAZINE) 10 MG tablet Take 1 tablet (10 mg total) by mouth every 6 (six) hours as needed for nausea or vomiting. 30 tablet 0   Rimegepant Sulfate (NURTEC) 75 MG TBDP Take 1 tablet (75 mg total) by mouth daily as needed (for migraine-). Take within 15 minutes of initial symptoms 8 tablet 5   tacrolimus (PROTOPIC) 0.1 % ointment Apply topically 2 (two) times daily. APPLY TO ITCHY AREAS TWICE A DAY FOR UP TO TWO WEEKS. ALTERNATE WITH TRIAMCINOLONE CREAM 100 g 0   topiramate (TOPAMAX) 25 MG tablet Take 1 tablet (25 mg total) by mouth at bedtime. X 1 week, then 50 mg QHS x 1 week, then 75 mg QHS x 1 week, then 100 mg QHS- stop increasing if migraines dramatically improve- for migraine prevention 120 tablet 5   triamcinolone ointment (KENALOG) 0.5 % Apply 1 Application topically 3 (three) times daily. Apply thin-layer on affected areas, no more than 2 weeks consistently. 60 g 2   triamterene-hydrochlorothiazide (MAXZIDE-25) 37.5-25 MG tablet TAKE 1 TABLET BY MOUTH DAILY 90 tablet 2   No current facility-administered medications for this visit.    REVIEW OF SYSTEMS:   Constitutional: ( - ) fevers, ( - )  chills , ( - ) night sweats Eyes: ( - ) blurriness of vision, ( - ) double vision, ( - ) watery eyes Ears, nose, mouth, throat, and face: ( - ) mucositis, ( - ) sore throat Respiratory: ( - ) cough, ( -) dyspnea, ( - ) wheezes Cardiovascular: (  - ) palpitation, ( - ) chest discomfort, ( - ) lower extremity swelling Gastrointestinal:  ( +) nausea, ( - ) heartburn, ( - ) change in bowel habits Skin: ( - ) abnormal skin rashes Lymphatics: ( - ) new lymphadenopathy, ( - ) easy bruising Neurological: (+ ) numbness, ( - ) tingling, ( - ) new weaknesses Behavioral/Psych: ( - ) mood change, ( - ) new changes  All other systems were reviewed with the patient and are negative.  PHYSICAL EXAMINATION: ECOG PERFORMANCE STATUS: 1 - Symptomatic but completely ambulatory  Vitals:   07/11/22 1055  BP: 124/69  Pulse: 71  Resp: 13  Temp: (!) 97.3 F (36.3 C)  SpO2: 96%      Filed Weights   07/11/22 1055  Weight: 185 lb 14.4 oz (84.3 kg)       GENERAL: Well-appearing middle-age Caucasian female, alert, no distress and comfortable SKIN: skin color, texture, turgor are normal, no rashes or significant lesions EYES: conjunctiva are pink and non-injected, sclera clear LUNGS:  normal breathing effort. Diffuse wheezing hear on ausculation.  HEART: regular rate & rhythm and no murmurs. Mild bilateral lower extremity edema Musculoskeletal: no cyanosis of digits and no clubbing  PSYCH: alert &  oriented x 3, fluent speech NEURO: no focal motor/sensory deficits  LABORATORY DATA:  I have reviewed the data as listed    Latest Ref Rng & Units 07/11/2022   10:31 AM 06/19/2022    8:36 AM 05/29/2022   10:41 AM  CBC  WBC 4.0 - 10.5 K/uL 9.3  8.7  7.4   Hemoglobin 12.0 - 15.0 g/dL 81.1  91.4  78.2   Hematocrit 36.0 - 46.0 % 41.4  38.5  38.5   Platelets 150 - 400 K/uL 212  194  185        Latest Ref Rng & Units 07/11/2022   10:31 AM 06/19/2022    8:36 AM 05/29/2022   10:41 AM  CMP  Glucose 70 - 99 mg/dL 88  94  95   BUN 8 - 23 mg/dL 12  14  9    Creatinine 0.44 - 1.00 mg/dL 9.56  2.13  0.86   Sodium 135 - 145 mmol/L 131  135  134   Potassium 3.5 - 5.1 mmol/L 3.8  4.3  3.6   Chloride 98 - 111 mmol/L 99  102  98   CO2 22 - 32 mmol/L 26  27   30    Calcium 8.9 - 10.3 mg/dL 8.7  8.5  8.5   Total Protein 6.5 - 8.1 g/dL 6.7  6.4  6.3   Total Bilirubin 0.3 - 1.2 mg/dL 0.4  0.4  0.4   Alkaline Phos 38 - 126 U/L 68  70  71   AST 15 - 41 U/L 11  11  14    ALT 0 - 44 U/L 7  7  9      No results found for: "MPROTEIN" Lab Results  Component Value Date   KPAFRELGTCHN 0.75 06/30/2014   LAMBDASER 3.78 (H) 06/30/2014   KAPLAMBRATIO 0.20 (L) 06/30/2014     RADIOGRAPHIC STUDIES: CT CORONARY MORPH W/CTA COR W/SCORE W/CA W/CM &/OR WO/CM  Result Date: 07/10/2022 HISTORY: Chest pain, nonspecific EXAM: Cardiac/Coronary CT TECHNIQUE: The patient was scanned on a [employer]. PROTOCOL: A 120 kV prospective scan was triggered in the descending thoracic aorta at 111 HU's. Axial non-contrast 3 mm slices were carried out through the heart. The data set was analyzed on a dedicated work station and scored using the Agatston method. Gantry rotation speed was 250 msecs and collimation was 0.6 mm. Heart rate was optimized medically and sl NTG was given. The 3D data set was reconstructed in 5% intervals of the 35-75 % of the R-R cycle. Systolic and diastolic phases were analyzed on a dedicated work station using MPR, MIP and VRT modes. The patient received OMNIPAQUE IOHEXOL 350 MG/ML SOLN of contrast. FINDINGS: Coronary calcium score: The patient's coronary artery calcium score is 556, which places the patient in the 96th percentile. Coronary arteries: Normal coronary origins.  Right dominance. Right Coronary Artery: Normal caliber vessel, gives rise to PDA. Focal predominantly calcified plaque in proximal vessel with 1-24% stenosis. Left Main Coronary Artery: Normal caliber vessel. Mixed calcified and noncalcified plaque with 1-24% stenosis. Left Anterior Descending Coronary Artery: Normal caliber vessel. Diffuse mixed calcified and noncalcified plaque throughout proximal and mid vessel with maximum 1-24% stenosis. Gives rise to very large first, small  second, small third diagonal branches. Left Circumflex Artery: Normal caliber vessel. Focal predominantly calcified plaque in proximal vessel with 1-24% stenosis. Gives rise to one branching first OM. Aorta: Normal size, 33 mm at the mid ascending aorta (level of the PA bifurcation) measured double  oblique. Aortic atherosclerosis. No dissection seen in visualized portions of the aorta. Aortic Valve: No calcifications. Trileaflet. Other findings: Normal pulmonary vein drainage into the left atrium. Normal left atrial appendage without a thrombus. Normal size of the pulmonary artery. Normal appearance of the pericardium. IMPRESSION: 1. Nonobstructive CAD, CADRADS = 1. 2. Coronary calcium score of 556. This was 96th percentile for age-, sex-, and race- matched controls. 3. Total plaque volume 348 mm3 which is 67th percentile for age- and sex- matched controls (calcified plaque 128 mm3; noncalcified plaque 220 mm3). Total plaque volume is severe. 4. Normal coronary origin with right dominance. 5. Aortic atherosclerosis. INTERPRETATION: CAD-RADS 1: Minimal non-obstructive CAD (1-24%). Consider non-atherosclerotic causes of chest pain. Consider preventive therapy and risk factor modification. Electronically Signed   By: Jodelle Red M.D.   On: 07/10/2022 17:02   US ABDOMEN COMPLETE W/ELASTOGRAPHY  Result Date: 06/18/2022 CLINICAL DATA:  Abnormal liver on MRI.  Rule out cirrhosis. EXAM: ULTRASOUND ABDOMEN ULTRASOUND HEPATIC ELASTOGRAPHY TECHNIQUE: Sonography of the upper abdomen was performed. In addition, ultrasound elastography evaluation of the liver was performed. A region of interest was placed within the right lobe of the liver. Following application of a compressive sonographic pulse, tissue compressibility was assessed. Multiple assessments were performed at the selected site. Median tissue compressibility was determined. Previously, hepatic stiffness was assessed by shear wave velocity. Based on  recently published Society of Radiologists in Ultrasound consensus article, reporting is now recommended to be performed in the SI units of pressure (kiloPascals) representing hepatic stiffness/elasticity. The obtained result is compared to the published reference standards. (cACLD = compensated Advanced Chronic Liver Disease) COMPARISON:  MRI 05/22/2022.  CT 04/26/2022. FINDINGS: ULTRASOUND ABDOMEN Gallbladder: Prior cholecystectomy Common bile duct: Diameter: Mildly prominent related to post cholecystectomy state, 8 mm. Liver: Coarsened echotexture, mildly nodular contours. Findings suspicious for early cirrhosis. No focal hepatic abnormality. Portal vein is patent on color Doppler imaging with normal direction of blood flow towards the liver. IVC: No abnormality visualized. Pancreas: Visualized portion unremarkable. Spleen: Upper limits normal with a craniocaudal length of 12.5 cm and a splenic volume of 394 mL. Right Kidney: Length: 10.9 cm. Echogenicity within normal limits. No mass or hydronephrosis visualized. Left Kidney: Length: 10.3 cm. Echogenicity within normal limits. No mass or hydronephrosis visualized. Abdominal aorta: No aneurysm visualized. Other findings: None. ULTRASOUND HEPATIC ELASTOGRAPHY Device: Siemens Helix VTQ Patient position: Supine Transducer DAX Number of measurements: 12 Hepatic segment:  8 Median kPa: 2.4 IQR: 0.7 IQR/Median kPa ratio: 0.29 Data quality:  Good Diagnostic category:  < or = 5 kPa: high probability of being normal The use of hepatic elastography is applicable to patients with viral hepatitis and non-alcoholic fatty liver disease. At this time, there is insufficient data for the referenced cut-off values and use in other causes of liver disease, including alcoholic liver disease. Patients, however, may be assessed by elastography and serve as their own reference standard/baseline. In patients with non-alcoholic liver disease, the values suggesting compensated advanced  chronic liver disease (cACLD) may be lower, and patients may need additional testing with elasticity results of 7-9 kPa. Please note that abnormal hepatic elasticity and shear wave velocities may also be identified in clinical settings other than with hepatic fibrosis, such as: acute hepatitis, elevated right heart and central venous pressures including use of beta blockers, veno-occlusive disease (Budd-Chiari), infiltrative processes such as mastocytosis/amyloidosis/infiltrative tumor/lymphoma, extrahepatic cholestasis, with hyperemia in the post-prandial state, and with liver transplantation. Correlation with patient history, laboratory data, and clinical condition recommended.  Diagnostic Categories: < or =5 kPa: high probability of being normal < or =9 kPa: in the absence of other known clinical signs, rules out cACLD >9 kPa and ?13 kPa: suggestive of cACLD, but needs further testing >13 kPa: highly suggestive of cACLD > or =17 kPa: highly suggestive of cACLD with an increased probability of clinically significant portal hypertension IMPRESSION: ULTRASOUND ABDOMEN: Heterogeneous echotexture and subtle nodularity of the contours of the liver suggests early cirrhosis. ULTRASOUND HEPATIC ELASTOGRAPHY: Median kPa:  2.4 Diagnostic category:  < or = 5 kPa: high probability of being normal Electronically Signed   By: Charlett Nose M.D.   On: 06/18/2022 10:31    ASSESSMENT & PLAN YESLIN COLFER 67 y.o. female with medical history significant for extensive stage small cell lung cancer who presents for a follow up visit.   After review of the labs, review of the records, and discussion with the patient the patients findings are most consistent with extensive stage small cell lung cancer with metastasis from the right lower lobe to the lymph nodes of the abdomen.  At this time we will pursue triple therapy with carboplatin, etoposide, and atezolizumab.  After 4 cycles we will convert to maintenance atezolizumab alone.   We previously discussed the risks and benefits of this therapy and the patient was in agreement to proceed with this treatment.  The treatment of choice consist of carboplatin, etoposide, and atezolizumab.  The regimen consists of carboplatin AUC of 5 IV on day 1, etoposide 100 mg per metered squared IV on day 1, 2, and 3 and atezolizumab 1200 mg on day 1.  This continues for 21-day cycles.  After 4 cycles the patient proceeds with atezolizumab maintenance therapy alone.    # Small Cell Lung Cancer, Extensive Stage -- MRI of the brain shows no evidence of intracranial spread --Findings are currently consistent with metastatic small cell lung cancer with metastatic spread to the lymph nodes of the abdomen -- Started carboplatin, etoposide, and atezolizumab on 11/13/2020. Transitioned to maintenance Atezolizumab on 02/22/2021.  -- Most recent CT CAP from 04/26/2022 which shows no evidence of recurrence.  Plan: --today is Cycle 27 Day 1 of Atezolizumab maintenance  --labs today were reviewed and adequate for treatment. Hgb 13.4, MCV 84.1, platelets 212, and white blood cell count 9.3.Marland Kitchen  Creatinine 1.02 with normal LFTs. --patient is willing and able to proceed with treatment at this time.  --CT scan to be collected every 3 months time.  Last scan on 04/26/2022 showed no evidence of residual recurrent disease.  Repeat scan due late June 2024 --RTC in 3 weeks for a follow up before Cycle 28.   # Early Cirrhosis --noted on MR abdomen from 05/22/2022. [place] with elastography performed on 06/18/2022. --continue to follow with GI.   #Right renal lesion: --Noted to be benign on most recent MRI of the abdomen.  #Diarrhea-improved --stool sent for C. Diff and GI pathology panel, all negative --patient following with GI and found to have pancreatic insufficiency and started on creon with improvement of symptoms.   #Nausea: --Symptoms improved with zofran and olanzapine.  --Added IV zofran to infusion  premeds for better symptom control.   #Hypokalemia:  --currently taking potassium chloride 20 meq BID --potassium level is still 3.6 today. Continue PO supplementation.   #Lower extremity edema --Currently takes lasix 20 mg 1-2 times a day. --Stable. Continue to monitor.   #Neuropathy involving fingers/feet: --Patient currently takes Cymbalta   #Supportive Care -- chemotherapy education complete --  port placed -- zofran 8mg  q8H PRN and compazine 10mg  PO q6H for nausea -- EMLA cream for port  Orders Placed This Encounter  Procedures   CT CHEST ABDOMEN PELVIS W CONTRAST    Standing Status:   Future    Standing Expiration Date:   07/12/2023    Order Specific Question:   If indicated for the ordered procedure, I authorize the administration of contrast media per Radiology protocol    Answer:   Yes    Order Specific Question:   Does the patient have a contrast media/X-ray dye allergy?    Answer:   No    Order Specific Question:   Preferred imaging location?    Answer:   [facility]    Order Specific Question:   If indicated for the ordered procedure, I authorize the administration of oral contrast media per Radiology protocol    Answer:   Yes   All questions were answered. The patient knows to call the clinic with any problems, questions or concerns.  I have spent a total of 30 minutes minutes of face-to-face and non-face-to-face time, preparing to see the patient, performing a medically appropriate examination, counseling and educating the patient, ordering medications/tests, documenting clinical information in the electronic health record, and care coordination.   Ulysees Barns, MD Department of Hematology/Oncology [facility] Phone: [phone] Pager: [phone] Email: [email or web address]  07/12/2022 9:01 AM

## 2022-07-12 ENCOUNTER — Other Ambulatory Visit: Payer: Self-pay | Admitting: Hematology and Oncology

## 2022-07-12 ENCOUNTER — Encounter: Payer: Self-pay | Admitting: Hematology and Oncology

## 2022-07-13 LAB — T4: T4, Total: 9.5 ug/dL (ref 4.5–12.0)

## 2022-07-24 ENCOUNTER — Ambulatory Visit (HOSPITAL_COMMUNITY): Payer: 59 | Attending: Student

## 2022-07-24 DIAGNOSIS — R0609 Other forms of dyspnea: Secondary | ICD-10-CM | POA: Insufficient documentation

## 2022-07-24 DIAGNOSIS — Z720 Tobacco use: Secondary | ICD-10-CM | POA: Diagnosis not present

## 2022-07-24 LAB — ECHOCARDIOGRAM COMPLETE
Area-P 1/2: 7.02 cm2
S' Lateral: 2.7 cm

## 2022-07-25 ENCOUNTER — Other Ambulatory Visit: Payer: Self-pay | Admitting: Physician Assistant

## 2022-07-25 DIAGNOSIS — E785 Hyperlipidemia, unspecified: Secondary | ICD-10-CM

## 2022-07-26 ENCOUNTER — Ambulatory Visit (HOSPITAL_COMMUNITY)
Admission: RE | Admit: 2022-07-26 | Discharge: 2022-07-26 | Disposition: A | Discharge status: Home / Self Care | Payer: 59 | Source: Ambulatory Visit | Attending: Hematology and Oncology | Admitting: Hematology and Oncology

## 2022-07-26 DIAGNOSIS — I700 Atherosclerosis of aorta: Secondary | ICD-10-CM | POA: Diagnosis not present

## 2022-07-26 DIAGNOSIS — K5730 Diverticulosis of large intestine without perforation or abscess without bleeding: Secondary | ICD-10-CM | POA: Diagnosis not present

## 2022-07-26 DIAGNOSIS — C3490 Malignant neoplasm of unspecified part of unspecified bronchus or lung: Secondary | ICD-10-CM | POA: Diagnosis not present

## 2022-07-26 MED ORDER — IOHEXOL 300 MG/ML  SOLN
100.0000 mL | Freq: Once | INTRAMUSCULAR | Status: AC | PRN
  Administered 2022-07-26: 100 mL via INTRAVENOUS

## 2022-07-26 MED ORDER — SODIUM CHLORIDE (PF) 0.9 % IJ SOLN
INTRAMUSCULAR | Status: AC
  Filled 2022-07-26: qty 50

## 2022-07-29 ENCOUNTER — Other Ambulatory Visit: Payer: Self-pay | Admitting: Physical Medicine and Rehabilitation

## 2022-07-31 ENCOUNTER — Inpatient Hospital Stay: Payer: 59 | Attending: Hematology and Oncology | Admitting: Hematology and Oncology

## 2022-07-31 ENCOUNTER — Encounter: Payer: Self-pay | Admitting: *Deleted

## 2022-07-31 ENCOUNTER — Other Ambulatory Visit: Payer: Self-pay

## 2022-07-31 ENCOUNTER — Inpatient Hospital Stay: Payer: 59 | Admitting: Dietician

## 2022-07-31 ENCOUNTER — Inpatient Hospital Stay: Payer: 59

## 2022-07-31 VITALS — BP 137/76 | HR 83 | Temp 97.0°F | Resp 15 | Wt 185.6 lb

## 2022-07-31 DIAGNOSIS — Z9049 Acquired absence of other specified parts of digestive tract: Secondary | ICD-10-CM | POA: Insufficient documentation

## 2022-07-31 DIAGNOSIS — R200 Anesthesia of skin: Secondary | ICD-10-CM | POA: Insufficient documentation

## 2022-07-31 DIAGNOSIS — Z95828 Presence of other vascular implants and grafts: Secondary | ICD-10-CM

## 2022-07-31 DIAGNOSIS — F419 Anxiety disorder, unspecified: Secondary | ICD-10-CM | POA: Diagnosis not present

## 2022-07-31 DIAGNOSIS — Z888 Allergy status to other drugs, medicaments and biological substances status: Secondary | ICD-10-CM | POA: Insufficient documentation

## 2022-07-31 DIAGNOSIS — Z8719 Personal history of other diseases of the digestive system: Secondary | ICD-10-CM | POA: Diagnosis not present

## 2022-07-31 DIAGNOSIS — Z809 Family history of malignant neoplasm, unspecified: Secondary | ICD-10-CM | POA: Insufficient documentation

## 2022-07-31 DIAGNOSIS — Z90721 Acquired absence of ovaries, unilateral: Secondary | ICD-10-CM | POA: Insufficient documentation

## 2022-07-31 DIAGNOSIS — Z79899 Other long term (current) drug therapy: Secondary | ICD-10-CM | POA: Insufficient documentation

## 2022-07-31 DIAGNOSIS — Z8249 Family history of ischemic heart disease and other diseases of the circulatory system: Secondary | ICD-10-CM | POA: Insufficient documentation

## 2022-07-31 DIAGNOSIS — Z79621 Long term (current) use of calcineurin inhibitor: Secondary | ICD-10-CM | POA: Insufficient documentation

## 2022-07-31 DIAGNOSIS — K219 Gastro-esophageal reflux disease without esophagitis: Secondary | ICD-10-CM | POA: Insufficient documentation

## 2022-07-31 DIAGNOSIS — K5730 Diverticulosis of large intestine without perforation or abscess without bleeding: Secondary | ICD-10-CM | POA: Insufficient documentation

## 2022-07-31 DIAGNOSIS — Z885 Allergy status to narcotic agent status: Secondary | ICD-10-CM | POA: Insufficient documentation

## 2022-07-31 DIAGNOSIS — Z811 Family history of alcohol abuse and dependence: Secondary | ICD-10-CM | POA: Insufficient documentation

## 2022-07-31 DIAGNOSIS — I700 Atherosclerosis of aorta: Secondary | ICD-10-CM | POA: Insufficient documentation

## 2022-07-31 DIAGNOSIS — Z823 Family history of stroke: Secondary | ICD-10-CM | POA: Insufficient documentation

## 2022-07-31 DIAGNOSIS — I10 Essential (primary) hypertension: Secondary | ICD-10-CM | POA: Insufficient documentation

## 2022-07-31 DIAGNOSIS — E785 Hyperlipidemia, unspecified: Secondary | ICD-10-CM | POA: Diagnosis not present

## 2022-07-31 DIAGNOSIS — C3490 Malignant neoplasm of unspecified part of unspecified bronchus or lung: Secondary | ICD-10-CM

## 2022-07-31 DIAGNOSIS — J432 Centrilobular emphysema: Secondary | ICD-10-CM | POA: Diagnosis not present

## 2022-07-31 DIAGNOSIS — Z5112 Encounter for antineoplastic immunotherapy: Secondary | ICD-10-CM | POA: Insufficient documentation

## 2022-07-31 DIAGNOSIS — Z882 Allergy status to sulfonamides status: Secondary | ICD-10-CM | POA: Diagnosis not present

## 2022-07-31 DIAGNOSIS — F32A Depression, unspecified: Secondary | ICD-10-CM | POA: Diagnosis not present

## 2022-07-31 DIAGNOSIS — Z90710 Acquired absence of both cervix and uterus: Secondary | ICD-10-CM | POA: Insufficient documentation

## 2022-07-31 DIAGNOSIS — I2510 Atherosclerotic heart disease of native coronary artery without angina pectoris: Secondary | ICD-10-CM | POA: Insufficient documentation

## 2022-07-31 DIAGNOSIS — Z880 Allergy status to penicillin: Secondary | ICD-10-CM | POA: Diagnosis not present

## 2022-07-31 DIAGNOSIS — F17210 Nicotine dependence, cigarettes, uncomplicated: Secondary | ICD-10-CM | POA: Diagnosis not present

## 2022-07-31 DIAGNOSIS — R110 Nausea: Secondary | ICD-10-CM | POA: Insufficient documentation

## 2022-07-31 DIAGNOSIS — Z814 Family history of other substance abuse and dependence: Secondary | ICD-10-CM | POA: Insufficient documentation

## 2022-07-31 DIAGNOSIS — Z79620 Long term (current) use of immunosuppressive biologic: Secondary | ICD-10-CM | POA: Diagnosis not present

## 2022-07-31 DIAGNOSIS — Z886 Allergy status to analgesic agent status: Secondary | ICD-10-CM | POA: Diagnosis not present

## 2022-07-31 DIAGNOSIS — Z820 Family history of epilepsy and other diseases of the nervous system: Secondary | ICD-10-CM | POA: Insufficient documentation

## 2022-07-31 DIAGNOSIS — C3431 Malignant neoplasm of lower lobe, right bronchus or lung: Secondary | ICD-10-CM | POA: Insufficient documentation

## 2022-07-31 DIAGNOSIS — J449 Chronic obstructive pulmonary disease, unspecified: Secondary | ICD-10-CM | POA: Insufficient documentation

## 2022-07-31 DIAGNOSIS — E119 Type 2 diabetes mellitus without complications: Secondary | ICD-10-CM | POA: Diagnosis not present

## 2022-07-31 DIAGNOSIS — Z8261 Family history of arthritis: Secondary | ICD-10-CM | POA: Insufficient documentation

## 2022-07-31 DIAGNOSIS — Z825 Family history of asthma and other chronic lower respiratory diseases: Secondary | ICD-10-CM | POA: Insufficient documentation

## 2022-07-31 LAB — CBC WITH DIFFERENTIAL (CANCER CENTER ONLY)
Abs Immature Granulocytes: 0.04 10*3/uL (ref 0.00–0.07)
Basophils Absolute: 0.1 10*3/uL (ref 0.0–0.1)
Basophils Relative: 1 %
Eosinophils Absolute: 0.5 10*3/uL (ref 0.0–0.5)
Eosinophils Relative: 7 %
HCT: 42.2 % (ref 36.0–46.0)
Hemoglobin: 13.6 g/dL (ref 12.0–15.0)
Immature Granulocytes: 1 %
Lymphocytes Relative: 31 %
Lymphs Abs: 2.5 10*3/uL (ref 0.7–4.0)
MCH: 27.9 pg (ref 26.0–34.0)
MCHC: 32.2 g/dL (ref 30.0–36.0)
MCV: 86.7 fL (ref 80.0–100.0)
Monocytes Absolute: 0.5 10*3/uL (ref 0.1–1.0)
Monocytes Relative: 6 %
Neutro Abs: 4.5 10*3/uL (ref 1.7–7.7)
Neutrophils Relative %: 54 %
Platelet Count: 182 10*3/uL (ref 150–400)
RBC: 4.87 MIL/uL (ref 3.87–5.11)
RDW: 21.5 % — ABNORMAL HIGH (ref 11.5–15.5)
WBC Count: 8.1 10*3/uL (ref 4.0–10.5)
nRBC: 0 % (ref 0.0–0.2)

## 2022-07-31 LAB — CMP (CANCER CENTER ONLY)
ALT: 8 U/L (ref 0–44)
AST: 11 U/L — ABNORMAL LOW (ref 15–41)
Albumin: 3.6 g/dL (ref 3.5–5.0)
Alkaline Phosphatase: 65 U/L (ref 38–126)
Anion gap: 7 (ref 5–15)
BUN: 13 mg/dL (ref 8–23)
CO2: 27 mmol/L (ref 22–32)
Calcium: 8.7 mg/dL — ABNORMAL LOW (ref 8.9–10.3)
Chloride: 100 mmol/L (ref 98–111)
Creatinine: 1.1 mg/dL — ABNORMAL HIGH (ref 0.44–1.00)
GFR, Estimated: 55 mL/min — ABNORMAL LOW (ref >60)
Glucose, Bld: 88 mg/dL (ref 70–99)
Potassium: 3.7 mmol/L (ref 3.5–5.1)
Sodium: 134 mmol/L — ABNORMAL LOW (ref 135–145)
Total Bilirubin: 0.4 mg/dL (ref 0.3–1.2)
Total Protein: 6 g/dL — ABNORMAL LOW (ref 6.5–8.1)

## 2022-07-31 LAB — TSH: TSH: 0.176 u[IU]/mL — ABNORMAL LOW (ref 0.350–4.500)

## 2022-07-31 MED ORDER — SODIUM CHLORIDE 0.9% FLUSH
10.0000 mL | Freq: Once | INTRAVENOUS | Status: AC
  Administered 2022-07-31: 10 mL

## 2022-07-31 MED ORDER — HEPARIN SOD (PORK) LOCK FLUSH 100 UNIT/ML IV SOLN
500.0000 [IU] | Freq: Once | INTRAVENOUS | Status: AC
  Administered 2022-07-31: 500 [IU] via INTRAVENOUS

## 2022-07-31 MED ORDER — SODIUM CHLORIDE 0.9% FLUSH
10.0000 mL | Freq: Once | INTRAVENOUS | Status: AC
  Administered 2022-07-31: 10 mL via INTRAVENOUS

## 2022-07-31 NOTE — Progress Notes (Deleted)
Nutrition Follow-up:  Patient with extensive stage small cell lung cancer. She is receiving maintenance therapy with atezolizumab q21d.         Medications: Cymbalta (7/3)  Labs: ***  Anthropometrics:   6/13 - 185 lb 14.4 oz 5/22 - 190 lb 8 oz 5/10 - 196 lb 6 oz   NUTRITION DIAGNOSIS: Inadequate oral intake   MALNUTRITION DIAGNOSIS: ***   INTERVENTION: ***    MONITORING, EVALUATION, GOAL: weight trends, intake   NEXT VISIT: ***

## 2022-07-31 NOTE — Patient Instructions (Signed)
Patient seen by Dr. Cruzita Lederer are  stable  Labs reviewed by  Dr. Leonides Schanz  Per physician team, patient will not be receiving treatment today. Per pt request

## 2022-07-31 NOTE — Progress Notes (Signed)
Treatment cancelled for today per patient preference

## 2022-07-31 NOTE — Progress Notes (Signed)
[name] [phone]) [phone]   [phone]  PROGRESS NOTE  Patient Care Team: Allwardt, Crist Infante, PA-C as PCP - General (Physician Assistant) O'Neal, Ronnald Ramp, MD as PCP - Cardiology (Cardiology) Rachael Fee, MD as Attending Physician (Gastroenterology) Dr. Louie Bun, MD as Consulting Physician (Orthopedic Surgery) Ranee Gosselin, MD as Consulting Physician (Orthopedic Surgery) York Spaniel, MD (Inactive) as Consulting Physician (Neurology) Smitty Cords, OD as Consulting Physician (Optometry) Karel Jarvis Lesle Chris, MD as Consulting Physician (Neurology) Erroll Luna, [facility] (Inactive) (Pharmacist)  Hematological/Oncological History # Small Cell Lung Cancer, Extensive Stage 07/05/2020: CT abdomen for lower abdominal pain. New pulmonary nodular density noted 07/06/2020: CT chest showed 2.2 cm macrolobulated right lower lobe pulmonary nodule (favored) versus pathologically enlarged infrahilar lymph node 10/13/2020: PET CT scan performed, findings show 2 cm right lower lobe lung mass is hypermetabolic and consistent with primary lung neoplasm. Additionally found hypermetabolic 17 mm soft tissue lesion between the descending duodenum and the pancreatic head  10/19/2020: EGD to biopsy hypermetabolic lymph node. Biopsy results show small cell lung cancer 10/26/2020: establish care with Dr. Leonides Schanz  11/13/2020: Cycle 1 Day 1 of Carbo/Etop/Atezolizumab 12/04/2020: Cycle 2 Day 1 of Carbo/Etop/Atezolizumab 11/22-11/25/2022: admitted for E. Coli bacteremia/sepsis. Start of Cycle 3 delayed. 01/02/2021: Cycle 3 Day 1 of Carbo/Etop/Atezolizumab 01/23/2021: Cycle 4 Day 1 of Carbo/Etop/Atezolizumab 02/22/2021: Cycle 5 Day 1 of Atezolizumab Maintenance. Delayed start due to patient's COVID infection.  03/14/2021: Cycle 6 Day 1 of Atezolizumab Maintenance 04/05/2021: Cycle 7 Day 1 of Atezolizumab Maintenance 04/25/2021: Cycle 8 Day 1 of Atezolizumab  Maintenance 05/16/2021: Cycle 9 Day 1 of Atezolizumab Maintenance 06/06/2021: Cycle 10 Day 1 of Atezolizumab Maintenance 06/27/2021:  Cycle 11 Day 1 of Atezolizumab Maintenance 07/18/2021: Cycle 12 Day 1 of Atezolizumab Maintenance 08/08/2021: Cycle 13 Day 1 of Atezolizumab Maintenance 08/29/2021: Cycle 14 Day 1 of Atezolizumab Maintenance 09/19/2021: Cycle 15 Day 1 of Atezolizumab Maintenance 10/10/2021: Cycle 16 Day 1 of Atezolizumab Maintenance 10/31/2021: Cycle 17 Day 1 of Atezolizumab Maintenance 11/21/2021: Cycle 18 Day 1 of Atezolizumab Maintenance 12/12/2021: Cycle 19 Day 1 of Atezolizumab Maintenance 01/02/2022: Cycle 20 Day 1 of Atezolizumab Maintenance 01/23/2022: Cycle 21 Day 1 of Atezolizumab Maintenance 02/13/2022: treatment HELD due to diarrhea.  03/06/2022: Cycle 22 Day 1 of Atezolizumab Maintenance 03/27/2022: Cycle 23 Day 1 of Atezolizumab Maintenance 04/17/2022: Cycle 24 Day 1 of Atezolizumab Maintenance 05/08/2022:  Cycle 25 Day 1 of Atezolizumab Maintenance (Held due to patient preference for treatment holiday) 05/29/2022: Cycle 25 Day 1 of Atezolizumab Maintenance  06/19/2022: Cycle 26 Day 1 of Atezolizumab Maintenance  07/12/2022: Cycle 27 Day 1 of Atezolizumab Maintenance 07/31/2022: Cycle 28 Day 1 of Atezolizumab Maintenance HELD per patient request for fatigue.   Interval History:  [name] 67 y.o. female with medical history significant for extensive stage small cell lung cancer who presents for a follow up visit. The patient's last visit was on 06/19/2022.  She presents today to start cycle 28 of chemotherapy.  On exam today she is accompanied by her husband.  On exam today [name] reports she has been "so-so" in the interim since her last visit.  She reports that she has more good days and bad days.  She reports that every day she wakes up and unfortunate has in the back of her mind that "the cancer may come back".  She notes that she quit taking Creon's that she go to  the bathroom better.  She notes that she feels like  she passes stool "like a cow" and a big pile.  She reports that sometimes she sits down to urinate has a "surprise bowel movement".  She notes that she would like to skip treatment today because she is feeling fatigued and wiped out.  She would also like to consider being seen less frequently and decreasing the amount of therapy she receives.  We had a detailed discussion about the necessity of continuing this therapy.  She was agreeable to a brief holiday today and restarting treatment in 3 weeks time.  She denies fevers, chills, sweats, chest pain or cough. She has no other complaints.A full 10 point ROS is listed below.   MEDICAL HISTORY:  Past Medical History:  Diagnosis Date   Allergic rhinitis    Anemia    Anxiety    Chicken pox    Chronic back pain    COPD (chronic obstructive pulmonary disease) (HCC)    Depression    DM (diabetes mellitus) (HCC)    Essential hypertension    GERD (gastroesophageal reflux disease)    Headache    migraines   History of gastritis    EGD 2015   History of home oxygen therapy    2 liters at hs last 6 months   Hyperlipidemia    Hypothyroidism    Migraines    Osteoarthritis    oa   Scoliosis    Small cell lung cancer (HCC)    Stage 4    SURGICAL HISTORY: Past Surgical History:  Procedure Laterality Date   APPENDECTOMY     1985   BIOPSY  07/24/2017   Procedure: BIOPSY;  Surgeon: Rachael Fee, MD;  Location: [facility];  Service: Endoscopy;;   CARDIAC CATHETERIZATION N/A 10/31/2015   Procedure: Left Heart Cath and Coronary Angiography;  Surgeon: Marykay Lex, MD;  Location: [facility] INVASIVE CV LAB;  Service: Cardiovascular;  Laterality: N/A;   CARPAL TUNNEL RELEASE Left    CARPAL TUNNEL RELEASE Right    CHOLECYSTECTOMY  late 1980's   COLONOSCOPY WITH PROPOFOL N/A 07/24/2017   Procedure: COLONOSCOPY WITH PROPOFOL;  Surgeon: Rachael Fee, MD;  Location: [facility];  Service:  Endoscopy;  Laterality: N/A;   ESOPHAGOGASTRODUODENOSCOPY N/A 07/24/2017   Procedure: ESOPHAGOGASTRODUODENOSCOPY (EGD);  Surgeon: Rachael Fee, MD;  Location: [facility];  Service: Endoscopy;  Laterality: N/A;   ESOPHAGOGASTRODUODENOSCOPY (EGD) WITH PROPOFOL N/A 10/19/2020   Procedure: ESOPHAGOGASTRODUODENOSCOPY (EGD) WITH PROPOFOL;  Surgeon: Rachael Fee, MD;  Location: [facility];  Service: Endoscopy;  Laterality: N/A;   FINE NEEDLE ASPIRATION N/A 10/19/2020   Procedure: FINE NEEDLE ASPIRATION (FNA) LINEAR;  Surgeon: Rachael Fee, MD;  Location: [facility];  Service: Endoscopy;  Laterality: N/A;   GALLBLADDER SURGERY  1991   HIP CLOSED REDUCTION Right 01/08/2016   Procedure: CLOSED MANIPULATION HIP;  Surgeon: Jene Every, MD;  Location: [facility];  Service: Orthopedics;  Laterality: Right;   HIP CLOSED REDUCTION Right 01/19/2016   Procedure: ATTEMPTED CLOSED REDUCTION RIGHT HIP;  Surgeon: Toni Arthurs, MD;  Location: [facility];  Service: Orthopedics;  Laterality: Right;   HIP CLOSED REDUCTION Right 01/20/2016   Procedure: CLOSED REDUCTION RIGHT TOTAL HIP;  Surgeon: Durene Romans, MD;  Location: [facility];  Service: Orthopedics;  Laterality: Right;   HIP CLOSED REDUCTION Right 02/17/2016   Procedure: CLOSED REDUCTION RIGHT TOTAL HIP;  Surgeon: Samson Frederic, MD;  Location: [facility];  Service: Orthopedics;  Laterality: Right;   HIP CLOSED REDUCTION Right 02/28/2016   Procedure: CLOSED REDUCTION HIP;  Surgeon: Yolonda Kida, MD;  Location: [facility];  Service: Orthopedics;  Laterality: Right;   IR IMAGING GUIDED PORT INSERTION  11/01/2020   POLYPECTOMY  07/24/2017   Procedure: POLYPECTOMY;  Surgeon: Rachael Fee, MD;  Location: [facility];  Service: Endoscopy;;   TONSILLECTOMY     TOTAL ABDOMINAL HYSTERECTOMY     1985, with 1 ovary removed and 2 nd ovary removed 2003   TOTAL HIP ARTHROPLASTY Right    Original surgery 2006 with revision 2010   TOTAL HIP REVISION Right 01/01/2016    Procedure: TOTAL HIP REVISION;  Surgeon: Durene Romans, MD;  Location: [facility];  Service: Orthopedics;  Laterality: Right;   TOTAL HIP REVISION Right 03/02/2016   Procedure: TOTAL HIP REVISION;  Surgeon: Durene Romans, MD;  Location: [facility];  Service: Orthopedics;  Laterality: Right;   TOTAL HIP REVISION Right 09/02/2016   Procedure: Right hip constrained liner- posterior;  Surgeon: Durene Romans, MD;  Location: [facility];  Service: Orthopedics;  Laterality: Right;   ULNAR NERVE TRANSPOSITION Right    UPPER ESOPHAGEAL ENDOSCOPIC ULTRASOUND (EUS) N/A 10/19/2020   Procedure: UPPER ESOPHAGEAL ENDOSCOPIC ULTRASOUND (EUS);  Surgeon: Rachael Fee, MD;  Location: [facility];  Service: Endoscopy;  Laterality: N/A;  periduodenal lesion    SOCIAL HISTORY: Social History   Socioeconomic History   Marital status: Married    Spouse name: Not on file   Number of children: 2   Years of education: Not on file   Highest education level: Not on file  Occupational History   Occupation: disabled   Occupation: disabled  Tobacco Use   Smoking status: Every Day    Packs/day: 2.00    Years: 46.00    Additional pack years: 0.00    Total pack years: 92.00    Types: Cigarettes   Smokeless tobacco: Never   Tobacco comments:    2 packs of cigarettes smoked daily 12/13/21- declines smoking cessation  Vaping Use   Vaping Use: Never used  Substance and Sexual Activity   Alcohol use: No   Drug use: No   Sexual activity: Not Currently    Partners: Male  Other Topics Concern   Not on file  Social History Narrative   Right handed    Caffeine~ 2 cups per day    Lives at home with husband (strained relationship)   Primary caretaker for disabled brother who had aneurism   Daughter died 2018/06/10   Social Determinants of Health   Financial Resource Strain: Low Risk  (05/16/2022)   Overall Financial Resource Strain (CARDIA)    Difficulty of Paying Living Expenses: Not hard at all  Food Insecurity: No Food  Insecurity (05/16/2022)   Hunger Vital Sign    Worried About Running Out of Food in the Last Year: Never true    Ran Out of Food in the Last Year: Never true  Transportation Needs: No Transportation Needs (05/16/2022)   PRAPARE - Administrator, Civil Service (Medical): No    Lack of Transportation (Non-Medical): No  Physical Activity: Inactive (05/16/2022)   Exercise Vital Sign    Days of Exercise per Week: 0 days    Minutes of Exercise per Session: 0 min  Stress: Stress Concern Present (05/16/2022)   [employer] of Occupational Health - Occupational Stress Questionnaire    Feeling of Stress : To some extent  Social Connections: Moderately Isolated (05/16/2022)   Social Connection and Isolation Panel [NHANES]    Frequency of Communication  with Friends and Family: More than three times a week    Frequency of Social Gatherings with Friends and Family: Once a week    Attends Religious Services: Never    Database administrator or Organizations: No    Attends Banker Meetings: Never    Marital Status: Married  Catering manager Violence: Not At Risk (05/16/2022)   Humiliation, Afraid, Rape, and Kick questionnaire    Fear of Current or Ex-Partner: No    Emotionally Abused: No    Physically Abused: No    Sexually Abused: No    FAMILY HISTORY: Family History  Problem Relation Age of Onset   COPD Mother    Heart disease Mother    Lung disease Father        Asbestosis   Heart attack Father    Heart disease Father    Cerebral aneurysm Brother    Aneurysm Brother        Brain   Drug abuse Daughter    Epilepsy Son    Alcohol abuse Son    Drug abuse Son    Arthritis Maternal Grandmother    Heart disease Maternal Grandmother    Asthma Maternal Grandfather    Cancer Maternal Grandfather    Arthritis Paternal Grandmother    Heart disease Paternal Grandmother    Stroke Paternal Grandmother    Early death Paternal Grandfather    Heart disease Paternal  Grandfather     ALLERGIES:  is allergic to metformin and related, nsaids, wellbutrin [bupropion], aleve [naproxen sodium], codeine, penicillins, and sulfonamide derivatives.  MEDICATIONS:  Current Outpatient Medications  Medication Sig Dispense Refill   albuterol (PROAIR HFA) 108 (90 Base) MCG/ACT inhaler 2 puffs every 4 hours as needed only  if your can't catch your breath 18 g 5   ALPRAZolam (XANAX) 1 MG tablet TAKE 1 TABLET BY MOUTH THREE TIMES DAILY AS NEEDED FOR ANXIETY 90 tablet 2   atorvastatin (LIPITOR) 20 MG tablet TAKE 1 TABLET BY MOUTH AT BEDTIME 90 tablet 1   benzonatate (TESSALON) 100 MG capsule Take 1 capsule (100 mg total) by mouth 3 (three) times daily. 90 capsule 1   CREON 36000-114000 units CPEP capsule TAKE TWO CAPSULES BY MOUTH THREE TIMES DAILY WITH MEALS (Patient not taking: Reported on 07/31/2022) 180 capsule 1   cyclobenzaprine (FLEXERIL) 10 MG tablet Take 10 mg by mouth 3 (three) times daily as needed.     dicyclomine (BENTYL) 10 MG capsule TAKE ONE CAPSULE BY MOUTH TWICE DAILY 60 capsule 1   diphenoxylate-atropine (LOMOTIL) 2.5-0.025 MG tablet Take 1 tablet by mouth 4 (four) times daily as needed for diarrhea or loose stools. 60 tablet 0   DULoxetine (CYMBALTA) 60 MG capsule TAKE TWO CAPSULES BY MOUTH EVERY DAY 180 capsule 1   ferrous sulfate 325 (65 FE) MG EC tablet Take 1 tablet (325 mg total) by mouth daily with breakfast. 30 tablet 3   fluticasone (FLONASE) 50 MCG/ACT nasal spray INSTILL 2 SPRAYS IN EACH NOSTRIL EVERY DAY 16 g 6   Fluticasone-Umeclidin-Vilant (TRELEGY ELLIPTA) 200-62.5-25 MCG/ACT AEPB Inhale 1 puff into the lungs daily at 2 PM. 60 each 5   furosemide (LASIX) 20 MG tablet TAKE 1 TABLET BY MOUTH TWICE DAILY, IF SWELLING IMPROVES CAN HOLD MEDICATION 90 tablet 0   HYDROcodone-acetaminophen (NORCO) 10-325 MG tablet Take 1 tablet by mouth every 4 (four) hours as needed. Due to be refilled 6/5 150 tablet 0   hydrOXYzine (ATARAX) 10 MG tablet Take 1 tablet  (  10 mg total) by mouth 3 (three) times daily as needed for itching. 30 tablet 0   levothyroxine (SYNTHROID) 137 MCG tablet Take 1 tablet (137 mcg total) by mouth daily before breakfast. 90 tablet 1   loratadine (CLARITIN) 10 MG tablet Take 10 mg by mouth daily.     meloxicam (MOBIC) 15 MG tablet TAKE 1 TABLET BY MOUTH EVERY DAY 30 tablet 2   metoprolol tartrate (LOPRESSOR) 100 MG tablet Take 2 hours prior to CT 1 tablet 0   OLANZapine (ZYPREXA) 10 MG tablet Take 1 tablet (10 mg total) by mouth at bedtime. 30 tablet 2   omeprazole (PRILOSEC) 40 MG capsule TAKE ONE CAPSULE BY MOUTH TWICE DAILY 180 capsule 3   ondansetron (ZOFRAN) 8 MG tablet TAKE 1 TABLET BY MOUTH TWICE DAILY 60 tablet 1   permethrin (ELIMITE) 5 % cream      potassium chloride SA (KLOR-CON M) 20 MEQ tablet TAKE 1 TABLET BY MOUTH TWICE DAILY 60 tablet 2   prochlorperazine (COMPAZINE) 10 MG tablet Take 1 tablet (10 mg total) by mouth every 6 (six) hours as needed for nausea or vomiting. 30 tablet 0   Rimegepant Sulfate (NURTEC) 75 MG TBDP Take 1 tablet (75 mg total) by mouth daily as needed (for migraine-). Take within 15 minutes of initial symptoms 8 tablet 5   tacrolimus (PROTOPIC) 0.1 % ointment Apply topically 2 (two) times daily. APPLY TO ITCHY AREAS TWICE A DAY FOR UP TO TWO WEEKS. ALTERNATE WITH TRIAMCINOLONE CREAM (Patient not taking: Reported on 07/31/2022) 100 g 0   topiramate (TOPAMAX) 25 MG tablet Take 1 tablet (25 mg total) by mouth at bedtime. X 1 week, then 50 mg QHS x 1 week, then 75 mg QHS x 1 week, then 100 mg QHS- stop increasing if migraines dramatically improve- for migraine prevention 120 tablet 5   triamcinolone ointment (KENALOG) 0.5 % Apply 1 Application topically 3 (three) times daily. Apply thin-layer on affected areas, no more than 2 weeks consistently. (Patient not taking: Reported on 07/31/2022) 60 g 2   triamterene-hydrochlorothiazide (MAXZIDE-25) 37.5-25 MG tablet TAKE 1 TABLET BY MOUTH DAILY 90 tablet 2   No  current facility-administered medications for this visit.    REVIEW OF SYSTEMS:   Constitutional: ( - ) fevers, ( - )  chills , ( - ) night sweats Eyes: ( - ) blurriness of vision, ( - ) double vision, ( - ) watery eyes Ears, nose, mouth, throat, and face: ( - ) mucositis, ( - ) sore throat Respiratory: ( - ) cough, ( -) dyspnea, ( - ) wheezes Cardiovascular: ( - ) palpitation, ( - ) chest discomfort, ( - ) lower extremity swelling Gastrointestinal:  ( +) nausea, ( - ) heartburn, ( - ) change in bowel habits Skin: ( - ) abnormal skin rashes Lymphatics: ( - ) new lymphadenopathy, ( - ) easy bruising Neurological: (+ ) numbness, ( - ) tingling, ( - ) new weaknesses Behavioral/Psych: ( - ) mood change, ( - ) new changes  All other systems were reviewed with the patient and are negative.  PHYSICAL EXAMINATION: ECOG PERFORMANCE STATUS: 1 - Symptomatic but completely ambulatory  Vitals:   07/31/22 0950  BP: 137/76  Pulse: 83  Resp: 15  Temp: (!) 97 F (36.1 C)  SpO2: 95%       Filed Weights   07/31/22 0950  Weight: 185 lb 9.6 oz (84.2 kg)        GENERAL: Well-appearing middle-age Caucasian  female, alert, no distress and comfortable SKIN: skin color, texture, turgor are normal, no rashes or significant lesions EYES: conjunctiva are pink and non-injected, sclera clear LUNGS:  normal breathing effort. Diffuse wheezing hear on ausculation.  HEART: regular rate & rhythm and no murmurs. Mild bilateral lower extremity edema Musculoskeletal: no cyanosis of digits and no clubbing  PSYCH: alert & oriented x 3, fluent speech NEURO: no focal motor/sensory deficits  LABORATORY DATA:  I have reviewed the data as listed    Latest Ref Rng & Units 07/31/2022    9:29 AM 07/11/2022   10:31 AM 06/19/2022    8:36 AM  CBC  WBC 4.0 - 10.5 K/uL 8.1  9.3  8.7   Hemoglobin 12.0 - 15.0 g/dL 16.1  09.6  04.5   Hematocrit 36.0 - 46.0 % 42.2  41.4  38.5   Platelets 150 - 400 K/uL 182  212  194         Latest Ref Rng & Units 07/31/2022    9:29 AM 07/11/2022   10:31 AM 06/19/2022    8:36 AM  CMP  Glucose 70 - 99 mg/dL 88  88  94   BUN 8 - 23 mg/dL 13  12  14    Creatinine 0.44 - 1.00 mg/dL 4.09  8.11  9.14   Sodium 135 - 145 mmol/L 134  131  135   Potassium 3.5 - 5.1 mmol/L 3.7  3.8  4.3   Chloride 98 - 111 mmol/L 100  99  102   CO2 22 - 32 mmol/L 27  26  27    Calcium 8.9 - 10.3 mg/dL 8.7  8.7  8.5   Total Protein 6.5 - 8.1 g/dL 6.0  6.7  6.4   Total Bilirubin 0.3 - 1.2 mg/dL 0.4  0.4  0.4   Alkaline Phos 38 - 126 U/L 65  68  70   AST 15 - 41 U/L 11  11  11    ALT 0 - 44 U/L 8  7  7      No results found for: "MPROTEIN" Lab Results  Component Value Date   KPAFRELGTCHN 0.75 06/30/2014   LAMBDASER 3.78 (H) 06/30/2014   KAPLAMBRATIO 0.20 (L) 06/30/2014     RADIOGRAPHIC STUDIES: CT CHEST ABDOMEN PELVIS W CONTRAST  Result Date: 07/26/2022 CLINICAL DATA:  Metastatic small-cell lung cancer, assess treatment response, ongoing immunotherapy, breastbone and abdominal pain, diarrhea, possible blood in stool * Tracking Code: BO * EXAM: CT CHEST, ABDOMEN, AND PELVIS WITH CONTRAST TECHNIQUE: Multidetector CT imaging of the chest, abdomen and pelvis was performed following the standard protocol during bolus administration of intravenous contrast. RADIATION DOSE REDUCTION: This exam was performed according to the departmental dose-optimization program which includes automated exposure control, adjustment of the mA and/or kV according to patient size and/or use of iterative reconstruction technique. CONTRAST:  OMNIPAQUE IOHEXOL 300 MG/ML  SOLN COMPARISON:  MR abdomen, 05/22/2022, CT chest abdomen pelvis, 04/26/2022 FINDINGS: CT CHEST FINDINGS Cardiovascular: Right chest port catheter. Aortic atherosclerosis. Normal heart size. Left coronary artery calcifications. No pericardial effusion. Mediastinum/Nodes: No enlarged mediastinal, hilar, or axillary lymph nodes. Thyroid gland, trachea, and  esophagus demonstrate no significant findings. Lungs/Pleura: Mild centrilobular and paraseptal emphysema. Unchanged elevation of the left hemidiaphragm. No pleural effusion or pneumothorax. Musculoskeletal: No chest wall abnormality. No acute osseous findings. CT ABDOMEN PELVIS FINDINGS Hepatobiliary: No focal liver abnormality is seen. Mildly coarse contour of the liver. Status post cholecystectomy. Unchanged postoperative biliary ductal dilatation Pancreas: Unremarkable. No pancreatic ductal  dilatation or surrounding inflammatory changes. Spleen: Normal in size without significant abnormality. Adrenals/Urinary Tract: Normal right adrenal gland. Unchanged left adrenal nodule, previously characterized as a definitively benign macroscopic fat containing adenoma (series 2, image 52). Unchanged intermediate attenuation lesion of the posterior midportion of the right kidney, previously characterized as a definitively benign hemorrhagic or proteinaceous cyst, for which no specific further follow-up or characterization is required (series 8, image 18). Kidneys are normal, without renal calculi, solid lesion, or hydronephrosis. Bladder is unremarkable. Stomach/Bowel: Stomach is within normal limits. Status post appendectomy. No evidence of bowel wall thickening, distention, or inflammatory changes. Descending and sigmoid diverticulosis. Very large burden of stool throughout the colon and rectum. Vascular/Lymphatic: Aortic atherosclerosis. No enlarged abdominal or pelvic lymph nodes. Reproductive: Status post hysterectomy. Other: No abdominal wall hernia or abnormality. No ascites. Musculoskeletal: No acute osseous findings. Status post right hip total arthroplasty. IMPRESSION: 1. No CT evidence of recurrent or metastatic disease in the chest, abdomen, or pelvis. 2. Very large burden of stool throughout the colon and rectum. Correlate for constipation. 3. Descending and sigmoid diverticulosis without evidence of acute  diverticulitis. 4. Mildly coarse contour of the liver, suggestive of cirrhosis. Correlate with biochemical findings. 5. Emphysema. 6. Coronary artery disease. Aortic Atherosclerosis (ICD10-I70.0) and Emphysema (ICD10-J43.9). Electronically Signed   By: Jearld Lesch M.D.   On: 07/26/2022 21:07   ECHOCARDIOGRAM COMPLETE  Result Date: 07/24/2022    ECHOCARDIOGRAM REPORT   Patient Name:   [name] Date of Exam: 07/24/2022 Medical Rec #:  [id number]       Height:       62.5 in Accession #:    [id number]      Weight:       185.9 lb Date of Birth:  02/23/1955        BSA:          1.864 m Patient Age:    67 years        BP:           101/76 mmHg Patient Gender: F               HR:           88 bpm. Exam Location:  [address] Procedure: 2D Echo, Cardiac Doppler, Color Doppler and Strain Analysis Indications:    R06.09 Dyspnea  History:        Patient has prior history of Echocardiogram examinations, most                 recent 10/11/2020. COPD and small cell lung cancer; Risk                 Factors:Hypertension, HLD, Sleep Apnea, Diabetes and Former                 Smoker.  Sonographer:    Clearence Ped RCS Referring Phys: [id number] Carlos Levering IMPRESSIONS  1. Left ventricular ejection fraction, by estimation, is 65 to 70%. The left ventricle has normal function. The left ventricle has no regional wall motion abnormalities. Left ventricular diastolic parameters were normal. The average left ventricular global longitudinal strain is -19.6 %. The global longitudinal strain is normal.  2. Right ventricular systolic function is normal. The right ventricular size is normal.  3. The mitral valve is normal in structure. Trivial mitral valve regurgitation. No evidence of mitral stenosis.  4. The aortic valve is grossly normal. Aortic valve regurgitation is not visualized. No aortic stenosis is present.  5. The  inferior vena cava is normal in size with greater than 50% respiratory variability, suggesting right atrial  pressure of 3 mmHg. Comparison(s): No significant change from prior study. Conclusion(s)/Recommendation(s): Normal biventricular function without evidence of hemodynamically significant valvular heart disease. FINDINGS  Left Ventricle: Left ventricular ejection fraction, by estimation, is 65 to 70%. The left ventricle has normal function. The left ventricle has no regional wall motion abnormalities. The average left ventricular global longitudinal strain is -19.6 %. The global longitudinal strain is normal. The left ventricular internal cavity size was normal in size. There is no left ventricular hypertrophy. Left ventricular diastolic parameters were normal. Right Ventricle: The right ventricular size is normal. No increase in right ventricular wall thickness. Right ventricular systolic function is normal. Left Atrium: Left atrial size was normal in size. Right Atrium: Right atrial size was normal in size. Pericardium: There is no evidence of pericardial effusion. Mitral Valve: The mitral valve is normal in structure. Trivial mitral valve regurgitation. No evidence of mitral valve stenosis. Tricuspid Valve: The tricuspid valve is normal in structure. Tricuspid valve regurgitation is trivial. No evidence of tricuspid stenosis. Aortic Valve: The aortic valve is grossly normal. Aortic valve regurgitation is not visualized. No aortic stenosis is present. Pulmonic Valve: The pulmonic valve was not well visualized. Pulmonic valve regurgitation is not visualized. Aorta: The aortic root, ascending aorta, aortic arch and descending aorta are all structurally normal, with no evidence of dilitation or obstruction. Venous: The inferior vena cava is normal in size with greater than 50% respiratory variability, suggesting right atrial pressure of 3 mmHg. IAS/Shunts: The atrial septum is grossly normal.  LEFT VENTRICLE PLAX 2D LVIDd:         4.30 cm   Diastology LVIDs:         2.70 cm   LV e' medial:    5.44 cm/s LV PW:          1.00 cm   LV E/e' medial:  10.4 LV IVS:        1.00 cm   LV e' lateral:   11.40 cm/s LVOT diam:     2.10 cm   LV E/e' lateral: 5.0 LV SV:         57 LV SV Index:   31        2D Longitudinal Strain LVOT Area:     3.46 cm  2D Strain GLS (A2C):   -19.3 %                          2D Strain GLS (A3C):   -19.3 %                          2D Strain GLS (A4C):   -20.2 %                          2D Strain GLS Avg:     -19.6 % RIGHT VENTRICLE RV Basal diam:  3.30 cm RV S prime:     13.40 cm/s TAPSE (M-mode): 2.3 cm LEFT ATRIUM             Index        RIGHT ATRIUM           Index LA diam:        3.70 cm 1.98 cm/m   RA Area:     10.90 cm LA Vol (A2C):  53.7 ml 28.80 ml/m  RA Volume:   22.30 ml  11.96 ml/m LA Vol (A4C):   27.1 ml 14.54 ml/m LA Biplane Vol: 39.4 ml 21.13 ml/m  AORTIC VALVE LVOT Vmax:   95.80 cm/s LVOT Vmean:  62.600 cm/s LVOT VTI:    0.165 m  AORTA Ao Root diam: 2.90 cm Ao Asc diam:  3.40 cm MITRAL VALVE MV Area (PHT):             SHUNTS MV Decel Time:             Systemic VTI:  0.16 m MV E velocity: 56.60 cm/s  Systemic Diam: 2.10 cm MV A velocity: 85.40 cm/s MV E/A ratio:  0.66 Jodelle Red MD Electronically signed by Jodelle Red MD Signature Date/Time: 07/24/2022/9:55:08 PM    Final    CT CORONARY MORPH W/CTA COR W/SCORE Vallarie Mare W/CM &/OR WO/CM  Addendum Date: 07/17/2022   ADDENDUM REPORT: 07/17/2022 15:44 EXAM: OVER-READ INTERPRETATION  CT CHEST The following report is an over-read performed by radiologist Dr. Tobias Alexander [facility] on 07/17/2022. This over-read does not include interpretation of cardiac or coronary anatomy or pathology. The coronary CTA interpretation by the cardiologist is attached. COMPARISON:  06/26/2022 FINDINGS: No significant extracardiac abnormality of the visualized chest or upper abdomen. IMPRESSION: No significant incidental extracardiac findings. Electronically Signed   By: Acquanetta Belling M.D.   On: 07/17/2022 15:44   Result Date:  07/17/2022 HISTORY: Chest pain, nonspecific EXAM: Cardiac/Coronary CT TECHNIQUE: The patient was scanned on a [employer]. PROTOCOL: A 120 kV prospective scan was triggered in the descending thoracic aorta at 111 HU's. Axial non-contrast 3 mm slices were carried out through the heart. The data set was analyzed on a dedicated work station and scored using the Agatston method. Gantry rotation speed was 250 msecs and collimation was 0.6 mm. Heart rate was optimized medically and sl NTG was given. The 3D data set was reconstructed in 5% intervals of the 35-75 % of the R-R cycle. Systolic and diastolic phases were analyzed on a dedicated work station using MPR, MIP and VRT modes. The patient received OMNIPAQUE IOHEXOL 350 MG/ML SOLN of contrast. FINDINGS: Coronary calcium score: The patient's coronary artery calcium score is 556, which places the patient in the 96th percentile. Coronary arteries: Normal coronary origins.  Right dominance. Right Coronary Artery: Normal caliber vessel, gives rise to PDA. Focal predominantly calcified plaque in proximal vessel with 1-24% stenosis. Left Main Coronary Artery: Normal caliber vessel. Mixed calcified and noncalcified plaque with 1-24% stenosis. Left Anterior Descending Coronary Artery: Normal caliber vessel. Diffuse mixed calcified and noncalcified plaque throughout proximal and mid vessel with maximum 1-24% stenosis. Gives rise to very large first, small second, small third diagonal branches. Left Circumflex Artery: Normal caliber vessel. Focal predominantly calcified plaque in proximal vessel with 1-24% stenosis. Gives rise to one branching first OM. Aorta: Normal size, 33 mm at the mid ascending aorta (level of the PA bifurcation) measured double oblique. Aortic atherosclerosis. No dissection seen in visualized portions of the aorta. Aortic Valve: No calcifications. Trileaflet. Other findings: Normal pulmonary vein drainage into the left atrium. Normal left  atrial appendage without a thrombus. Normal size of the pulmonary artery. Normal appearance of the pericardium. IMPRESSION: 1. Nonobstructive CAD, CADRADS = 1. 2. Coronary calcium score of 556. This was 96th percentile for age-, sex-, and race- matched controls. 3. Total plaque volume 348 mm3 which is 67th percentile for age- and sex- matched controls (calcified plaque  128 mm3; noncalcified plaque 220 mm3). Total plaque volume is severe. 4. Normal coronary origin with right dominance. 5. Aortic atherosclerosis. INTERPRETATION: CAD-RADS 1: Minimal non-obstructive CAD (1-24%). Consider non-atherosclerotic causes of chest pain. Consider preventive therapy and risk factor modification. Electronically Signed: By: Jodelle Red M.D. On: 07/10/2022 17:02    ASSESSMENT & PLAN [name] 67 y.o. female with medical history significant for extensive stage small cell lung cancer who presents for a follow up visit.   After review of the labs, review of the records, and discussion with the patient the patients findings are most consistent with extensive stage small cell lung cancer with metastasis from the right lower lobe to the lymph nodes of the abdomen.  At this time we will pursue triple therapy with carboplatin, etoposide, and atezolizumab.  After 4 cycles we will convert to maintenance atezolizumab alone.  We previously discussed the risks and benefits of this therapy and the patient was in agreement to proceed with this treatment.  The treatment of choice consist of carboplatin, etoposide, and atezolizumab.  The regimen consists of carboplatin AUC of 5 IV on day 1, etoposide 100 mg per metered squared IV on day 1, 2, and 3 and atezolizumab 1200 mg on day 1.  This continues for 21-day cycles.  After 4 cycles the patient proceeds with atezolizumab maintenance therapy alone.    # Small Cell Lung Cancer, Extensive Stage -- MRI of the brain shows no evidence of intracranial spread --Findings are  currently consistent with metastatic small cell lung cancer with metastatic spread to the lymph nodes of the abdomen -- Started carboplatin, etoposide, and atezolizumab on 11/13/2020. Transitioned to maintenance Atezolizumab on 02/22/2021.  -- Most recent CT CAP from 04/26/2022 which shows no evidence of recurrence.  Plan: --today is Cycle 28 Day 1 of Atezolizumab maintenance. Patient would like to HOLD treatment due to fatigue.  --labs today were reviewed and adequate for treatment. Hgb 13.6, WBC 8.1, MCV 86.7, Plt 182 --patient is willing and able to proceed with treatment at this time.  --CT scan to be collected every 3 months time.  Last scan on 07/26/2022 showed no evidence of residual recurrent disease.  Repeat scan due late Sept 2024 --RTC in 3 weeks for a follow up before Cycle 28.   # Early Cirrhosis --noted on MR abdomen from 05/22/2022. [place] with elastography performed on 06/18/2022. --continue to follow with GI.   #Right renal lesion: --Noted to be benign on most recent MRI of the abdomen.  #Diarrhea-improved --stool sent for C. Diff and GI pathology panel, all negative --patient following with GI and found to have pancreatic insufficiency and started on creon with improvement of symptoms.   #Nausea: --Symptoms improved with zofran and olanzapine.  --Added IV zofran to infusion premeds for better symptom control.   #Hypokalemia:  --currently taking potassium chloride 20 meq BID --potassium level is still 3.7 today. Continue PO supplementation.   #Lower extremity edema --Currently takes lasix 20 mg 1-2 times a day. --Stable. Continue to monitor.   #Neuropathy involving fingers/feet: --Patient currently takes Cymbalta   #Supportive Care -- chemotherapy education complete -- port placed -- zofran 8mg  q8H PRN and compazine 10mg  PO q6H for nausea -- EMLA cream for port  No orders of the defined types were placed in this encounter.  All questions were answered. The patient  knows to call the clinic with any problems, questions or concerns.  I have spent a total of 30 minutes minutes of face-to-face and  non-face-to-face time, preparing to see the patient, performing a medically appropriate examination, counseling and educating the patient, ordering medications/tests, documenting clinical information in the electronic health record, and care coordination.   Ulysees Barns, MD Department of Hematology/Oncology [facility] at [facility] Phone: [phone] Pager: [phone] Email: [email or web address]  07/31/2022 3:56 PM

## 2022-08-02 LAB — T4: T4, Total: 9.5 ug/dL (ref 4.5–12.0)

## 2022-08-03 ENCOUNTER — Other Ambulatory Visit: Payer: Self-pay | Admitting: Physician Assistant

## 2022-08-04 ENCOUNTER — Other Ambulatory Visit: Payer: Self-pay | Admitting: Physical Medicine and Rehabilitation

## 2022-08-04 NOTE — Progress Notes (Signed)
Cardiology Clinic Note   Date: 08/04/2022 ID: [name], DOB 08-09-1955, MRN [id number]  Primary Cardiologist:  Reatha Harps, MD  Patient Profile    [name] is a 67 y.o. female who presents to the clinic today for ***    Past medical history significant for: Nonobstructive CAD. LHC 10/31/2015 (abnormal stress test): Angiographically no significant CAD with relatively minimal inferior wall perfusion with a small caliber PDA and no significant posterolateral branching.  False positive stress test. Coronary CTA 07/10/2022: Coronary calcium score 556 (96 percentile).  Minimal nonobstructive CAD. Echo 07/24/2022: EF 65 to 70%.  Normal RV function.  Trivial MR. Hypertension. Hyperlipidemia. Lipid panel 11/30/2019: LDL 27, HDL 32, TG 193, total 98. Lower extremity pain. ABIs 09/20/2020: No evidence of significant arterial disease bilateral lower extremities.  Abnormal TBI on the left. COPD. Small cell lung cancer. OSA. Hypothyroidism. T2DM. Tobacco abuse.     History of Present Illness    [name] was first seen by Dr. Flora Lipps on 09/13/2020 for chest pain at the request of Dr. Everardo All. Patient reported intermittent chest pain described as pressure occurring during stressful situations and not increased with exertion. She also reported bilateral lower extremity pain with absent pulses and right lower extremity and poor pulses in left lower extremity. Discomfort described as cramping. Coronary CTA showed calcium score of 500 with minimal plaque in LM, LAD and LCx. ABIs did not show arterial disease.   Patient was last seen in the office by me on 06/26/2022 with complaints of exertional central chest pain and shortness of breath.  Pain was similar to pain that initially brought her to cardiology but more intense.  Patient reported continuing to smoke heavily.  She requested a nicotine patch to assist with smoking cessation.  Unfortunately, she was unable to afford the patch.   She agreed to speak with PCP or her pulmonary physician to assist with getting her patch or another method for her to quit smoking.  Coronary CTA showed nonobstructive CAD (details above).  Echo showed normal LV/RV function with no significant valvular abnormalities.  Today, patient ***    Nonobstructive CAD/DOE/chest pain.  Coronary CTA June 2024 showed calcium score 556 (96 percentile), minimal nonobstructive CAD.  Echo showed normal LV/RV function with no significant valvular abnormalities.  Patient*** Continue atorvastatin. Hypertension. BP today***.  Patient denies headaches, dizziness or vision changes. Continue Maxzide. Tobacco abuse.  Patient is a heavy smoker, 2.5 packs/day.***   ROS: All other systems reviewed and are otherwise negative except as noted in History of Present Illness.  Studies Reviewed       ***  Risk Assessment/Calculations    {Does this patient have ATRIAL FIBRILLATION?:[phone]} No BP recorded.  {Refresh Note OR Click here to enter BP  :1}***        Physical Exam    VS:  There were no vitals taken for this visit. , BMI There is no height or weight on file to calculate BMI.  GEN: Well nourished, well developed, in no acute distress. Neck: No JVD or carotid bruits. Cardiac: *** RRR. No murmurs. No rubs or gallops.   Respiratory:  Respirations regular and unlabored. Clear to auscultation without rales, wheezing or rhonchi. GI: Soft, nontender, nondistended. Extremities: Radials/DP/PT 2+ and equal bilaterally. No clubbing or cyanosis. No edema ***  Skin: Warm and dry, no rash. Neuro: Strength intact.  Assessment & Plan   ***  Disposition: ***     {Are you ordering a CV Procedure (  e.g. stress test, cath, DCCV, TEE, etc)?   Press F2        :[id number]}   Signed, Etta Grandchild. Alyus Mofield, DNP, NP-C

## 2022-08-05 MED ORDER — HYDROCODONE-ACETAMINOPHEN 10-325 MG PO TABS: 1.0000 | ORAL_TABLET | ORAL | 0 refills | Status: DC | PRN

## 2022-08-07 ENCOUNTER — Encounter: Payer: Self-pay | Admitting: Student

## 2022-08-07 ENCOUNTER — Ambulatory Visit: Payer: 59 | Attending: Student | Admitting: Student

## 2022-08-07 VITALS — BP 118/74 | HR 94 | Ht 62.0 in | Wt 183.2 lb

## 2022-08-07 DIAGNOSIS — R0609 Other forms of dyspnea: Secondary | ICD-10-CM

## 2022-08-07 DIAGNOSIS — R0789 Other chest pain: Secondary | ICD-10-CM | POA: Diagnosis not present

## 2022-08-07 DIAGNOSIS — Z720 Tobacco use: Secondary | ICD-10-CM | POA: Diagnosis not present

## 2022-08-07 DIAGNOSIS — I10 Essential (primary) hypertension: Secondary | ICD-10-CM | POA: Diagnosis not present

## 2022-08-07 DIAGNOSIS — I2510 Atherosclerotic heart disease of native coronary artery without angina pectoris: Secondary | ICD-10-CM

## 2022-08-07 NOTE — Patient Instructions (Addendum)
Medication Instructions:  Your physician recommends that you continue on your current medications as directed. Please refer to the Current Medication list given to you today.   If you need a refill on your cardiac medications before your next appointment, please call your pharmacy  Lab Work: none If you have labs (blood work) drawn today and your tests are completely normal, you will receive your results only by: MyChart Message (if you have MyChart) OR A paper copy in the mail If you have any lab test that is abnormal or we need to change your treatment, we will call you to review the results.   Testing/Procedures: none  Follow-Up: At [facility], you and your health needs are our priority.  As part of our continuing mission to provide you with exceptional heart care, we have created designated Provider Care Teams.  These Care Teams include your primary Cardiologist (physician) and Advanced Practice Providers (APPs -  Physician Assistants and Nurse Practitioners) who all work together to provide you with the care you need, when you need it.  Your next appointment:   1 year(s)  Provider:   Reatha Harps, MD

## 2022-08-13 ENCOUNTER — Ambulatory Visit (INDEPENDENT_AMBULATORY_CARE_PROVIDER_SITE_OTHER): Payer: 59 | Admitting: Dermatology

## 2022-08-13 VITALS — BP 110/74 | HR 92

## 2022-08-13 DIAGNOSIS — T451X5A Adverse effect of antineoplastic and immunosuppressive drugs, initial encounter: Secondary | ICD-10-CM | POA: Diagnosis not present

## 2022-08-13 DIAGNOSIS — L299 Pruritus, unspecified: Secondary | ICD-10-CM

## 2022-08-13 DIAGNOSIS — D1801 Hemangioma of skin and subcutaneous tissue: Secondary | ICD-10-CM

## 2022-08-13 MED ORDER — PREDNISONE 10 MG PO TABS: ORAL_TABLET | ORAL | 1 refills | Status: AC

## 2022-08-13 MED ORDER — CALCIPOTRIENE 0.005 % EX CREA: TOPICAL_CREAM | CUTANEOUS | 3 refills | Status: AC

## 2022-08-13 NOTE — Patient Instructions (Addendum)
Thank you for visiting [place] today. We appreciate your commitment to managing your health and are here to support you in improving your skin condition.  Here are the key instructions from today's consultation:  - Medications and Usage:   - Continue using Triamcinolone cream for two weeks, followed by a two-week break.   - During the break from Triamcinolone, use Calcipotriene cream for two weeks. Alternate between these two creams.   - Hydroxyzine as needed for severe itching, though sparingly due to its sedative effects.   - Claritin daily to manage symptoms.   - Start a course of Prednisone (10 mg tablets): Take four tablets daily for four days, then reduce the dose gradually over the following weeks (three tablets for four days, then two, then one).  - Follow-Up Appointments:   - Please return for a follow-up in two months to assess the effectiveness of the treatment and make any necessary adjustments.  - Additional Notes:   - Ensure you are fueling your body adequately, especially with increased fatigue. Consider nutritional supplements like Ensure or protein shakes if appetite is low.   - Monitor your blood pressure and blood sugar levels during Prednisone treatment. Contact us if you notice any significant changes.  We have arranged for the prescription of the new cream and the continuation of your current medications. Please feel free to reach out if you have any questions or concerns in the meantime.    Treatment Plan: -Calcipotriene 0.05% cream to affected area twice a day when on break from steroid. -Prednisone taper as directed   Due to recent changes in healthcare laws, you may see results of your pathology and/or laboratory studies on MyChart before the doctors have had a chance to review them. We understand that in some cases there may be results that are confusing or concerning to you. Please understand that not all results are received at the same time and often the doctors  may need to interpret multiple results in order to provide you with the best plan of care or course of treatment. Therefore, we ask that you please give [place] 2 business days to thoroughly review all your results before contacting the office for clarification. Should we see a critical lab result, you will be contacted sooner.   If You Need Anything After Your Visit  If you have any questions or concerns for your doctor, please call our main line at [phone] If no one answers, please leave a voicemail as directed and we will return your call as soon as possible. Messages left after 4 pm will be answered the following business day.   You may also send [place] a message via MyChart. We typically respond to MyChart messages within 1-2 business days.  For prescription refills, please ask your pharmacy to contact our office. Our fax number is [phone].  If you have an urgent issue when the clinic is closed that cannot wait until the next business day, you can page your doctor at the number below.    Please note that while we do our best to be available for urgent issues outside of office hours, we are not available 24/7.   If you have an urgent issue and are unable to reach [place], you may choose to seek medical care at your doctor's office, retail clinic, urgent care center, or emergency room.  If you have a medical emergency, please immediately call 911 or go to the emergency department. In the event of inclement weather, please call  our main line at [phone] for an update on the status of any delays or closures.  Dermatology Medication Tips: Please keep the boxes that topical medications come in in order to help keep track of the instructions about where and how to use these. Pharmacies typically print the medication instructions only on the boxes and not directly on the medication tubes.   If your medication is too expensive, please contact our office at [phone] or send [place] a message through  MyChart.   We are unable to tell what your co-pay for medications will be in advance as this is different depending on your insurance coverage. However, we may be able to find a substitute medication at lower cost or fill out paperwork to get insurance to cover a needed medication.   If a prior authorization is required to get your medication covered by your insurance company, please allow [place] 1-2 business days to complete this process.  Drug prices often vary depending on where the prescription is filled and some pharmacies may offer cheaper prices.  The website www.goodrx.com contains coupons for medications through different pharmacies. The prices here do not account for what the cost may be with help from insurance (it may be cheaper with your insurance), but the website can give you the price if you did not use any insurance.  - You can print the associated coupon and take it with your prescription to the pharmacy.  - You may also stop by our office during regular business hours and pick up a GoodRx coupon card.  - If you need your prescription sent electronically to a different pharmacy, notify our office through [facility] or by phone at [phone]

## 2022-08-13 NOTE — Progress Notes (Signed)
   Follow-Up Visit   Subjective  [name] is a 67 y.o. female who presents for the following: pruritus.  Patient present today for follow up visit for itchiness. Patient was last evaluated on 06/13/22. Patient reports she is not resolved. She has been taking hydroxyzine daily and using Triamcinolone bid for 2 wks then stops but doesn't put anything on during times she isn't using it. Patient reports no medication changes.   The following portions of the chart were reviewed this encounter and updated as appropriate: medications, allergies, medical history  Review of Systems:  No other skin or systemic complaints except as noted in HPI or Assessment and Plan.  Objective  Well appearing patient in no apparent distress; mood and affect are within normal limits.   A focused examination was performed of the following areas: Axilla area, arms, hip  Relevant exam findings are noted in the Assessment and Plan.  Exam:  pt exhibits active itching and numerous linear excoriations on        Assessment & Plan    Pruritus (Secondary to Immunotherapy)  Generalized Pruritus - Assessment: Patient experiencing generalized itching. - Plan: Continue Claritin daily. Prescribe prednisone taper: 10 mg tablets, 4 tablets daily for 4 days, 3 tablets daily for 4 days, 2 tablets daily for 4 days, and 1 tablet daily for 4 days, to be taken with breakfast. Continue triamcinolone cream twice daily for 2 weeks, then alternate with calcipotriene cream twice daily for 2 weeks. Monitor blood pressure and blood sugar levels while on prednisone. Follow up in 2 months to assess response to treatment.  2. 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 - Plan: Reassurance provided, no treatment needed.  3.  Immunotherapy Side Effects - Assessment: Patient experiencing side effects from immunotherapy. - Plan: Patient to discuss with oncologist regarding  continuation or modification of immunotherapy regimen. Encourage communication with oncology team regarding side effects and overall well-being. Encourage adequate hydration, electrolyte balance, and nutrition. Monitor response to prednisone taper, as it may improve energy levels.  Return in about 2 months (around 10/14/2022) for F/U pruritis .  Owens Shark, CMA, am acting as scribe for [employer], DO.   Documentation: I have reviewed the above documentation for accuracy and completeness, and I agree with the above.  Langston Reusing, DO

## 2022-08-16 ENCOUNTER — Other Ambulatory Visit: Payer: Self-pay | Admitting: Nurse Practitioner

## 2022-08-16 ENCOUNTER — Other Ambulatory Visit: Payer: Self-pay | Admitting: Physician Assistant

## 2022-08-16 NOTE — Telephone Encounter (Signed)
Please advise 

## 2022-08-21 ENCOUNTER — Encounter: Payer: Self-pay | Admitting: Dermatology

## 2022-08-22 ENCOUNTER — Other Ambulatory Visit: Payer: Self-pay | Admitting: *Deleted

## 2022-08-22 ENCOUNTER — Inpatient Hospital Stay (HOSPITAL_BASED_OUTPATIENT_CLINIC_OR_DEPARTMENT_OTHER): Payer: 59 | Admitting: Hematology and Oncology

## 2022-08-22 ENCOUNTER — Other Ambulatory Visit: Payer: Self-pay

## 2022-08-22 ENCOUNTER — Inpatient Hospital Stay: Payer: 59

## 2022-08-22 ENCOUNTER — Inpatient Hospital Stay: Payer: 59 | Admitting: Dietician

## 2022-08-22 VITALS — BP 115/73 | HR 72 | Resp 16

## 2022-08-22 DIAGNOSIS — Z95828 Presence of other vascular implants and grafts: Secondary | ICD-10-CM

## 2022-08-22 DIAGNOSIS — C3490 Malignant neoplasm of unspecified part of unspecified bronchus or lung: Secondary | ICD-10-CM | POA: Diagnosis not present

## 2022-08-22 DIAGNOSIS — Z8719 Personal history of other diseases of the digestive system: Secondary | ICD-10-CM | POA: Diagnosis not present

## 2022-08-22 DIAGNOSIS — E785 Hyperlipidemia, unspecified: Secondary | ICD-10-CM | POA: Diagnosis not present

## 2022-08-22 DIAGNOSIS — Z79620 Long term (current) use of immunosuppressive biologic: Secondary | ICD-10-CM | POA: Diagnosis not present

## 2022-08-22 DIAGNOSIS — J449 Chronic obstructive pulmonary disease, unspecified: Secondary | ICD-10-CM | POA: Diagnosis not present

## 2022-08-22 DIAGNOSIS — I2510 Atherosclerotic heart disease of native coronary artery without angina pectoris: Secondary | ICD-10-CM | POA: Diagnosis not present

## 2022-08-22 DIAGNOSIS — I700 Atherosclerosis of aorta: Secondary | ICD-10-CM | POA: Diagnosis not present

## 2022-08-22 DIAGNOSIS — Z79899 Other long term (current) drug therapy: Secondary | ICD-10-CM | POA: Diagnosis not present

## 2022-08-22 DIAGNOSIS — Z79621 Long term (current) use of calcineurin inhibitor: Secondary | ICD-10-CM | POA: Diagnosis not present

## 2022-08-22 DIAGNOSIS — C3431 Malignant neoplasm of lower lobe, right bronchus or lung: Secondary | ICD-10-CM | POA: Diagnosis not present

## 2022-08-22 DIAGNOSIS — R200 Anesthesia of skin: Secondary | ICD-10-CM | POA: Diagnosis not present

## 2022-08-22 DIAGNOSIS — Z5112 Encounter for antineoplastic immunotherapy: Secondary | ICD-10-CM | POA: Diagnosis not present

## 2022-08-22 DIAGNOSIS — F32A Depression, unspecified: Secondary | ICD-10-CM | POA: Diagnosis not present

## 2022-08-22 DIAGNOSIS — Z886 Allergy status to analgesic agent status: Secondary | ICD-10-CM | POA: Diagnosis not present

## 2022-08-22 DIAGNOSIS — K219 Gastro-esophageal reflux disease without esophagitis: Secondary | ICD-10-CM | POA: Diagnosis not present

## 2022-08-22 DIAGNOSIS — Z882 Allergy status to sulfonamides status: Secondary | ICD-10-CM | POA: Diagnosis not present

## 2022-08-22 DIAGNOSIS — Z880 Allergy status to penicillin: Secondary | ICD-10-CM | POA: Diagnosis not present

## 2022-08-22 DIAGNOSIS — I10 Essential (primary) hypertension: Secondary | ICD-10-CM | POA: Diagnosis not present

## 2022-08-22 DIAGNOSIS — R110 Nausea: Secondary | ICD-10-CM | POA: Diagnosis not present

## 2022-08-22 DIAGNOSIS — J432 Centrilobular emphysema: Secondary | ICD-10-CM | POA: Diagnosis not present

## 2022-08-22 DIAGNOSIS — F17210 Nicotine dependence, cigarettes, uncomplicated: Secondary | ICD-10-CM | POA: Diagnosis not present

## 2022-08-22 DIAGNOSIS — Z885 Allergy status to narcotic agent status: Secondary | ICD-10-CM | POA: Diagnosis not present

## 2022-08-22 DIAGNOSIS — E119 Type 2 diabetes mellitus without complications: Secondary | ICD-10-CM | POA: Diagnosis not present

## 2022-08-22 DIAGNOSIS — K5730 Diverticulosis of large intestine without perforation or abscess without bleeding: Secondary | ICD-10-CM | POA: Diagnosis not present

## 2022-08-22 LAB — CBC WITH DIFFERENTIAL (CANCER CENTER ONLY)
Abs Immature Granulocytes: 0.09 10*3/uL — ABNORMAL HIGH (ref 0.00–0.07)
Basophils Absolute: 0 10*3/uL (ref 0.0–0.1)
Basophils Relative: 0 %
Eosinophils Absolute: 0 10*3/uL (ref 0.0–0.5)
Eosinophils Relative: 0 %
HCT: 43.7 % (ref 36.0–46.0)
Hemoglobin: 15 g/dL (ref 12.0–15.0)
Immature Granulocytes: 1 %
Lymphocytes Relative: 11 %
Lymphs Abs: 1.1 10*3/uL (ref 0.7–4.0)
MCH: 29.1 pg (ref 26.0–34.0)
MCHC: 34.3 g/dL (ref 30.0–36.0)
MCV: 84.9 fL (ref 80.0–100.0)
Monocytes Absolute: 0.5 10*3/uL (ref 0.1–1.0)
Monocytes Relative: 5 %
Neutro Abs: 7.8 10*3/uL — ABNORMAL HIGH (ref 1.7–7.7)
Neutrophils Relative %: 83 %
Platelet Count: 172 10*3/uL (ref 150–400)
RBC: 5.15 MIL/uL — ABNORMAL HIGH (ref 3.87–5.11)
RDW: 19.9 % — ABNORMAL HIGH (ref 11.5–15.5)
WBC Count: 9.4 10*3/uL (ref 4.0–10.5)
nRBC: 0 % (ref 0.0–0.2)

## 2022-08-22 LAB — CMP (CANCER CENTER ONLY)
ALT: 12 U/L (ref 0–44)
AST: 12 U/L — ABNORMAL LOW (ref 15–41)
Albumin: 3.9 g/dL (ref 3.5–5.0)
Alkaline Phosphatase: 71 U/L (ref 38–126)
Anion gap: 8 (ref 5–15)
BUN: 9 mg/dL (ref 8–23)
CO2: 27 mmol/L (ref 22–32)
Calcium: 8.6 mg/dL — ABNORMAL LOW (ref 8.9–10.3)
Chloride: 91 mmol/L — ABNORMAL LOW (ref 98–111)
Creatinine: 1.09 mg/dL — ABNORMAL HIGH (ref 0.44–1.00)
GFR, Estimated: 56 mL/min — ABNORMAL LOW (ref >60)
Glucose, Bld: 105 mg/dL — ABNORMAL HIGH (ref 70–99)
Potassium: 3.5 mmol/L (ref 3.5–5.1)
Sodium: 126 mmol/L — ABNORMAL LOW (ref 135–145)
Total Bilirubin: 0.4 mg/dL (ref 0.3–1.2)
Total Protein: 6.3 g/dL — ABNORMAL LOW (ref 6.5–8.1)

## 2022-08-22 LAB — TSH: TSH: 0.064 u[IU]/mL — ABNORMAL LOW (ref 0.350–4.500)

## 2022-08-22 MED ORDER — HEPARIN SOD (PORK) LOCK FLUSH 100 UNIT/ML IV SOLN
500.0000 [IU] | Freq: Once | INTRAVENOUS | Status: AC | PRN
  Administered 2022-08-22: 500 [IU]

## 2022-08-22 MED ORDER — ONDANSETRON HCL 8 MG PO TABS: 8.0000 mg | ORAL_TABLET | Freq: Two times a day (BID) | ORAL | 1 refills | Status: DC

## 2022-08-22 MED ORDER — SODIUM CHLORIDE 0.9 % IV SOLN
1200.0000 mg | Freq: Once | INTRAVENOUS | Status: AC
  Administered 2022-08-22: 1200 mg via INTRAVENOUS
  Filled 2022-08-22: qty 20

## 2022-08-22 MED ORDER — SODIUM CHLORIDE 0.9 % IV SOLN: Freq: Once | INTRAVENOUS | Status: AC

## 2022-08-22 MED ORDER — ONDANSETRON HCL 4 MG/2ML IJ SOLN
8.0000 mg | Freq: Once | INTRAMUSCULAR | Status: AC
  Administered 2022-08-22: 8 mg via INTRAVENOUS
  Filled 2022-08-22: qty 4

## 2022-08-22 MED ORDER — SODIUM CHLORIDE 0.9% FLUSH
10.0000 mL | INTRAVENOUS | Status: DC | PRN
  Administered 2022-08-22: 10 mL

## 2022-08-22 MED ORDER — SODIUM CHLORIDE 0.9% FLUSH
10.0000 mL | Freq: Once | INTRAVENOUS | Status: AC
  Administered 2022-08-22: 10 mL

## 2022-08-22 NOTE — Progress Notes (Signed)
[facility] [phone]   [phone]  PROGRESS NOTE  Patient Care Team: Allwardt, Crist Infante, PA-C as PCP - General (Physician Assistant) O'Neal, Ronnald Ramp, MD as PCP - Cardiology (Cardiology) Rachael Fee, MD as Attending Physician (Gastroenterology) Dr. Louie Bun, MD as Consulting Physician (Orthopedic Surgery) Ranee Gosselin, MD as Consulting Physician (Orthopedic Surgery) York Spaniel, MD (Inactive) as Consulting Physician (Neurology) Smitty Cords, OD as Consulting Physician (Optometry) Karel Jarvis Lesle Chris, MD as Consulting Physician (Neurology) Erroll Luna, [facility] (Inactive) (Pharmacist)  Hematological/Oncological History # Small Cell Lung Cancer, Extensive Stage 07/05/2020: CT abdomen for lower abdominal pain. New pulmonary nodular density noted 07/06/2020: CT chest showed 2.2 cm macrolobulated right lower lobe pulmonary nodule (favored) versus pathologically enlarged infrahilar lymph node 10/13/2020: PET CT scan performed, findings show 2 cm right lower lobe lung mass is hypermetabolic and consistent with primary lung neoplasm. Additionally found hypermetabolic 17 mm soft tissue lesion between the descending duodenum and the pancreatic head  10/19/2020: EGD to biopsy hypermetabolic lymph node. Biopsy results show small cell lung cancer 10/26/2020: establish care with Dr. Leonides Schanz  11/13/2020: Cycle 1 Day 1 of Carbo/Etop/Atezolizumab 12/04/2020: Cycle 2 Day 1 of Carbo/Etop/Atezolizumab 11/22-11/25/2022: admitted for E. Coli bacteremia/sepsis. Start of Cycle 3 delayed. 01/02/2021: Cycle 3 Day 1 of Carbo/Etop/Atezolizumab 01/23/2021: Cycle 4 Day 1 of Carbo/Etop/Atezolizumab 02/22/2021: Cycle 5 Day 1 of Atezolizumab Maintenance. Delayed start due to patient's COVID infection.  03/14/2021: Cycle 6 Day 1 of Atezolizumab Maintenance 04/05/2021: Cycle 7 Day 1 of Atezolizumab Maintenance 04/25/2021: Cycle 8 Day 1 of Atezolizumab  Maintenance 05/16/2021: Cycle 9 Day 1 of Atezolizumab Maintenance 06/06/2021: Cycle 10 Day 1 of Atezolizumab Maintenance 06/27/2021:  Cycle 11 Day 1 of Atezolizumab Maintenance 07/18/2021: Cycle 12 Day 1 of Atezolizumab Maintenance 08/08/2021: Cycle 13 Day 1 of Atezolizumab Maintenance 08/29/2021: Cycle 14 Day 1 of Atezolizumab Maintenance 09/19/2021: Cycle 15 Day 1 of Atezolizumab Maintenance 10/10/2021: Cycle 16 Day 1 of Atezolizumab Maintenance 10/31/2021: Cycle 17 Day 1 of Atezolizumab Maintenance 11/21/2021: Cycle 18 Day 1 of Atezolizumab Maintenance 12/12/2021: Cycle 19 Day 1 of Atezolizumab Maintenance 01/02/2022: Cycle 20 Day 1 of Atezolizumab Maintenance 01/23/2022: Cycle 21 Day 1 of Atezolizumab Maintenance 02/13/2022: treatment HELD due to diarrhea.  03/06/2022: Cycle 22 Day 1 of Atezolizumab Maintenance 03/27/2022: Cycle 23 Day 1 of Atezolizumab Maintenance 04/17/2022: Cycle 24 Day 1 of Atezolizumab Maintenance 05/08/2022:  Cycle 25 Day 1 of Atezolizumab Maintenance (Held due to patient preference for treatment holiday) 05/29/2022: Cycle 25 Day 1 of Atezolizumab Maintenance  06/19/2022: Cycle 26 Day 1 of Atezolizumab Maintenance  07/12/2022: Cycle 27 Day 1 of Atezolizumab Maintenance 07/31/2022: Cycle 28 Day 1 of Atezolizumab Maintenance HELD per patient request for fatigue.  08/22/2022: Cycle 28 Day 1 of Atezolizumab Maintenance  Interval History:  [name] 67 y.o. female with medical history significant for extensive stage small cell lung cancer who presents for a follow up visit. The patient's last visit was on 07/31/2022.  She presents today to start cycle 28 of chemotherapy.   On exam today [name] reports she has "answered the question about whether or not to continue immunotherapy".  She notes that she is now adamant that she would like to continue treatment.  She reports that the last 3 weeks have not been good.  She has had trouble getting out of bed and is "drag year and  regular".  She reports that she developed diarrhea after stopping her Creon medication and that she is  constipated when taking it.  She is having trouble finding a "happy medium".  She notes that her appetite is "nonexistent".  She is doing her best to try to eat when she can.  She did develop a rash on her back and shoulders which was treated with prednisone therapy.  She notes that it has been itchy and she "cannot quite reach it but if she can she scratches it off".  She notes that she is also taking Prilosec twice daily and Rolaids due to stomach upset from the steroid taper.  Overall she is willing and able to proceed with immunotherapy today.  She denies fevers, chills, sweats, chest pain or cough. She has no other complaints.A full 10 point ROS is listed below.   MEDICAL HISTORY:  Past Medical History:  Diagnosis Date   Allergic rhinitis    Anemia    Anxiety    Chicken pox    Chronic back pain    COPD (chronic obstructive pulmonary disease) (HCC)    Depression    DM (diabetes mellitus) (HCC)    Essential hypertension    GERD (gastroesophageal reflux disease)    Headache    migraines   History of gastritis    EGD 2015   History of home oxygen therapy    2 liters at hs last 6 months   Hyperlipidemia    Hypothyroidism    Migraines    Osteoarthritis    oa   Scoliosis    Small cell lung cancer (HCC)    Stage 4    SURGICAL HISTORY: Past Surgical History:  Procedure Laterality Date   APPENDECTOMY     1985   BIOPSY  07/24/2017   Procedure: BIOPSY;  Surgeon: Rachael Fee, MD;  Location: [facility];  Service: Endoscopy;;   CARDIAC CATHETERIZATION N/A 10/31/2015   Procedure: Left Heart Cath and Coronary Angiography;  Surgeon: Marykay Lex, MD;  Location: [facility] INVASIVE CV LAB;  Service: Cardiovascular;  Laterality: N/A;   CARPAL TUNNEL RELEASE Left    CARPAL TUNNEL RELEASE Right    CHOLECYSTECTOMY  late 1980's   COLONOSCOPY WITH PROPOFOL N/A 07/24/2017   Procedure:  COLONOSCOPY WITH PROPOFOL;  Surgeon: Rachael Fee, MD;  Location: [facility];  Service: Endoscopy;  Laterality: N/A;   ESOPHAGOGASTRODUODENOSCOPY N/A 07/24/2017   Procedure: ESOPHAGOGASTRODUODENOSCOPY (EGD);  Surgeon: Rachael Fee, MD;  Location: [facility];  Service: Endoscopy;  Laterality: N/A;   ESOPHAGOGASTRODUODENOSCOPY (EGD) WITH PROPOFOL N/A 10/19/2020   Procedure: ESOPHAGOGASTRODUODENOSCOPY (EGD) WITH PROPOFOL;  Surgeon: Rachael Fee, MD;  Location: [facility];  Service: Endoscopy;  Laterality: N/A;   FINE NEEDLE ASPIRATION N/A 10/19/2020   Procedure: FINE NEEDLE ASPIRATION (FNA) LINEAR;  Surgeon: Rachael Fee, MD;  Location: [facility];  Service: Endoscopy;  Laterality: N/A;   GALLBLADDER SURGERY  1991   HIP CLOSED REDUCTION Right 01/08/2016   Procedure: CLOSED MANIPULATION HIP;  Surgeon: Jene Every, MD;  Location: [facility];  Service: Orthopedics;  Laterality: Right;   HIP CLOSED REDUCTION Right 01/19/2016   Procedure: ATTEMPTED CLOSED REDUCTION RIGHT HIP;  Surgeon: Toni Arthurs, MD;  Location: [facility];  Service: Orthopedics;  Laterality: Right;   HIP CLOSED REDUCTION Right 01/20/2016   Procedure: CLOSED REDUCTION RIGHT TOTAL HIP;  Surgeon: Durene Romans, MD;  Location: [facility];  Service: Orthopedics;  Laterality: Right;   HIP CLOSED REDUCTION Right 02/17/2016   Procedure: CLOSED REDUCTION RIGHT TOTAL HIP;  Surgeon: Samson Frederic, MD;  Location: [facility];  Service: Orthopedics;  Laterality:  Right;   HIP CLOSED REDUCTION Right 02/28/2016   Procedure: CLOSED REDUCTION HIP;  Surgeon: Yolonda Kida, MD;  Location: [facility];  Service: Orthopedics;  Laterality: Right;   IR IMAGING GUIDED PORT INSERTION  11/01/2020   POLYPECTOMY  07/24/2017   Procedure: POLYPECTOMY;  Surgeon: Rachael Fee, MD;  Location: [facility];  Service: Endoscopy;;   TONSILLECTOMY     TOTAL ABDOMINAL HYSTERECTOMY     1985, with 1 ovary removed and 2 nd ovary removed 2003   TOTAL HIP ARTHROPLASTY  Right    Original surgery 2006 with revision 2010   TOTAL HIP REVISION Right 01/01/2016   Procedure: TOTAL HIP REVISION;  Surgeon: Durene Romans, MD;  Location: [facility];  Service: Orthopedics;  Laterality: Right;   TOTAL HIP REVISION Right 03/02/2016   Procedure: TOTAL HIP REVISION;  Surgeon: Durene Romans, MD;  Location: [facility];  Service: Orthopedics;  Laterality: Right;   TOTAL HIP REVISION Right 09/02/2016   Procedure: Right hip constrained liner- posterior;  Surgeon: Durene Romans, MD;  Location: [facility];  Service: Orthopedics;  Laterality: Right;   ULNAR NERVE TRANSPOSITION Right    UPPER ESOPHAGEAL ENDOSCOPIC ULTRASOUND (EUS) N/A 10/19/2020   Procedure: UPPER ESOPHAGEAL ENDOSCOPIC ULTRASOUND (EUS);  Surgeon: Rachael Fee, MD;  Location: [facility];  Service: Endoscopy;  Laterality: N/A;  periduodenal lesion    SOCIAL HISTORY: Social History   Socioeconomic History   Marital status: Married    Spouse name: Not on file   Number of children: 2   Years of education: Not on file   Highest education level: Not on file  Occupational History   Occupation: disabled   Occupation: disabled  Tobacco Use   Smoking status: Every Day    Current packs/day: 2.00    Average packs/day: 2.0 packs/day for 46.0 years (92.0 ttl pk-yrs)    Types: Cigarettes   Smokeless tobacco: Never   Tobacco comments:    2 packs of cigarettes smoked daily 12/13/21- declines smoking cessation  Vaping Use   Vaping status: Never Used  Substance and Sexual Activity   Alcohol use: No   Drug use: No   Sexual activity: Not Currently    Partners: Male  Other Topics Concern   Not on file  Social History Narrative   Right handed    Caffeine~ 2 cups per day    Lives at home with husband (strained relationship)   Primary caretaker for disabled brother who had aneurism   Daughter died 06/19/18   Social Determinants of Health   Financial Resource Strain: Low Risk  (05/16/2022)   Overall Financial Resource Strain  (CARDIA)    Difficulty of Paying Living Expenses: Not hard at all  Food Insecurity: No Food Insecurity (05/16/2022)   Hunger Vital Sign    Worried About Running Out of Food in the Last Year: Never true    Ran Out of Food in the Last Year: Never true  Transportation Needs: No Transportation Needs (05/16/2022)   PRAPARE - Administrator, Civil Service (Medical): No    Lack of Transportation (Non-Medical): No  Physical Activity: Inactive (05/16/2022)   Exercise Vital Sign    Days of Exercise per Week: 0 days    Minutes of Exercise per Session: 0 min  Stress: Stress Concern Present (05/16/2022)   [employer] of Occupational Health - Occupational Stress Questionnaire    Feeling of Stress : To some extent  Social Connections: Moderately Isolated (05/16/2022)   Social Connection  and Isolation Panel [NHANES]    Frequency of Communication with Friends and Family: More than three times a week    Frequency of Social Gatherings with Friends and Family: Once a week    Attends Religious Services: Never    Database administrator or Organizations: No    Attends Banker Meetings: Never    Marital Status: Married  Catering manager Violence: Not At Risk (05/16/2022)   Humiliation, Afraid, Rape, and Kick questionnaire    Fear of Current or Ex-Partner: No    Emotionally Abused: No    Physically Abused: No    Sexually Abused: No    FAMILY HISTORY: Family History  Problem Relation Age of Onset   COPD Mother    Heart disease Mother    Lung disease Father        Asbestosis   Heart attack Father    Heart disease Father    Cerebral aneurysm Brother    Aneurysm Brother        Brain   Drug abuse Daughter    Epilepsy Son    Alcohol abuse Son    Drug abuse Son    Arthritis Maternal Grandmother    Heart disease Maternal Grandmother    Asthma Maternal Grandfather    Cancer Maternal Grandfather    Arthritis Paternal Grandmother    Heart disease Paternal Grandmother     Stroke Paternal Grandmother    Early death Paternal Grandfather    Heart disease Paternal Grandfather     ALLERGIES:  is allergic to metformin and related, nsaids, wellbutrin [bupropion], aleve [naproxen sodium], codeine, penicillins, and sulfonamide derivatives.  MEDICATIONS:  Current Outpatient Medications  Medication Sig Dispense Refill   albuterol (PROAIR HFA) 108 (90 Base) MCG/ACT inhaler 2 puffs every 4 hours as needed only  if your can't catch your breath 18 g 5   ALPRAZolam (XANAX) 1 MG tablet TAKE 1 TABLET BY MOUTH THREE TIMES DAILY AS NEEDED FOR ANXIETY 90 tablet 2   atorvastatin (LIPITOR) 20 MG tablet TAKE 1 TABLET BY MOUTH AT BEDTIME 90 tablet 1   benzonatate (TESSALON) 100 MG capsule Take 1 capsule (100 mg total) by mouth 3 (three) times daily. 90 capsule 1   calcipotriene (DOVONOX) 0.005 % cream Apply to affected area bid when on break from steroid cream 120 g 3   CREON 36000-114000 units CPEP capsule TAKE TWO CAPSULES BY MOUTH THREE TIMES DAILY WITH MEALS (Patient not taking: Reported on 08/07/2022) 180 capsule 1   cyclobenzaprine (FLEXERIL) 10 MG tablet Take 10 mg by mouth 3 (three) times daily as needed.     dicyclomine (BENTYL) 10 MG capsule TAKE ONE CAPSULE BY MOUTH TWICE DAILY 60 capsule 1   diphenoxylate-atropine (LOMOTIL) 2.5-0.025 MG tablet Take 1 tablet by mouth 4 (four) times daily as needed for diarrhea or loose stools. 60 tablet 0   DULoxetine (CYMBALTA) 60 MG capsule TAKE TWO CAPSULES BY MOUTH EVERY DAY 180 capsule 1   ferrous sulfate 325 (65 FE) MG EC tablet Take 1 tablet (325 mg total) by mouth daily with breakfast. 30 tablet 3   fluticasone (FLONASE) 50 MCG/ACT nasal spray INSTILL 2 SPRAYS IN EACH NOSTRIL EVERY DAY 16 g 6   Fluticasone-Umeclidin-Vilant (TRELEGY ELLIPTA) 200-62.5-25 MCG/ACT AEPB Inhale 1 puff into the lungs daily at 2 PM. 60 each 5   furosemide (LASIX) 20 MG tablet TAKE 1 TABLET BY MOUTH TWICE DAILY, IF SWELLING IMPROVES CAN HOLD MEDICATION 90  tablet 0   HYDROcodone-acetaminophen (NORCO) 10-325  MG tablet Take 1 tablet by mouth every 4 (four) hours as needed. Due to be refilled 6/5 150 tablet 0   hydrOXYzine (ATARAX) 10 MG tablet Take 1 tablet (10 mg total) by mouth 3 (three) times daily as needed for itching. 30 tablet 0   levothyroxine (SYNTHROID) 137 MCG tablet Take 1 tablet (137 mcg total) by mouth daily before breakfast. 90 tablet 1   loratadine (CLARITIN) 10 MG tablet Take 10 mg by mouth daily.     meloxicam (MOBIC) 15 MG tablet TAKE 1 TABLET BY MOUTH EVERY DAY 30 tablet 2   metoprolol tartrate (LOPRESSOR) 100 MG tablet Take 2 hours prior to CT 1 tablet 0   OLANZapine (ZYPREXA) 10 MG tablet Take 1 tablet (10 mg total) by mouth at bedtime. 30 tablet 2   omeprazole (PRILOSEC) 40 MG capsule TAKE ONE CAPSULE BY MOUTH TWICE DAILY 180 capsule 3   ondansetron (ZOFRAN) 8 MG tablet Take 1 tablet (8 mg total) by mouth 2 (two) times daily. 60 tablet 1   permethrin (ELIMITE) 5 % cream      potassium chloride SA (KLOR-CON M) 20 MEQ tablet TAKE 1 TABLET BY MOUTH TWICE DAILY 60 tablet 2   predniSONE (DELTASONE) 10 MG tablet Take 4 tablets (40 mg total) by mouth daily with breakfast for 4 days, THEN 3 tablets (30 mg total) daily with breakfast for 4 days, THEN 2 tablets (20 mg total) daily with breakfast for 4 days, THEN 1 tablet (10 mg total) daily with breakfast for 4 days. Take 40 mg for 4 days, then 30 mg for 4 days,  then 20mg  for 4 days, then 10mg  for 4 days. 40 tablet 1   prochlorperazine (COMPAZINE) 10 MG tablet Take 1 tablet (10 mg total) by mouth every 6 (six) hours as needed for nausea or vomiting. 30 tablet 0   Rimegepant Sulfate (NURTEC) 75 MG TBDP Take 1 tablet (75 mg total) by mouth daily as needed (for migraine-). Take within 15 minutes of initial symptoms 8 tablet 5   topiramate (TOPAMAX) 25 MG tablet Take 1 tablet (25 mg total) by mouth at bedtime. X 1 week, then 50 mg QHS x 1 week, then 75 mg QHS x 1 week, then 100 mg QHS- stop  increasing if migraines dramatically improve- for migraine prevention 120 tablet 5   triamcinolone ointment (KENALOG) 0.5 % Apply 1 Application topically 3 (three) times daily. Apply thin-layer on affected areas, no more than 2 weeks consistently. 60 g 2   triamterene-hydrochlorothiazide (MAXZIDE-25) 37.5-25 MG tablet TAKE 1 TABLET BY MOUTH DAILY 90 tablet 2   No current facility-administered medications for this visit.   Facility-Administered Medications Ordered in Other Visits  Medication Dose Route Frequency Provider Last Rate Last Admin   sodium chloride flush (NS) 0.9 % injection 10 mL  10 mL Intracatheter PRN Jaci Standard, MD   10 mL at 08/22/22 1309    REVIEW OF SYSTEMS:   Constitutional: ( - ) fevers, ( - )  chills , ( - ) night sweats Eyes: ( - ) blurriness of vision, ( - ) double vision, ( - ) watery eyes Ears, nose, mouth, throat, and face: ( - ) mucositis, ( - ) sore throat Respiratory: ( - ) cough, ( -) dyspnea, ( - ) wheezes Cardiovascular: ( - ) palpitation, ( - ) chest discomfort, ( - ) lower extremity swelling Gastrointestinal:  ( +) nausea, ( - ) heartburn, ( - ) change in bowel habits Skin: ( - )  abnormal skin rashes Lymphatics: ( - ) new lymphadenopathy, ( - ) easy bruising Neurological: (+ ) numbness, ( - ) tingling, ( - ) new weaknesses Behavioral/Psych: ( - ) mood change, ( - ) new changes  All other systems were reviewed with the patient and are negative.  PHYSICAL EXAMINATION: ECOG PERFORMANCE STATUS: 1 - Symptomatic but completely ambulatory  Vitals:   08/22/22 1057  BP: 137/82  Pulse: 80  Resp: 16  Temp: (!) 97.3 F (36.3 C)  SpO2: 96%        Filed Weights   08/22/22 1057  Weight: 180 lb 6.4 oz (81.8 kg)         GENERAL: Well-appearing middle-age Caucasian female, alert, no distress and comfortable SKIN: skin color, texture, turgor are normal, no rashes or significant lesions EYES: conjunctiva are pink and non-injected, sclera  clear LUNGS:  normal breathing effort. Diffuse wheezing hear on ausculation.  HEART: regular rate & rhythm and no murmurs. Mild bilateral lower extremity edema Musculoskeletal: no cyanosis of digits and no clubbing  PSYCH: alert & oriented x 3, fluent speech NEURO: no focal motor/sensory deficits  LABORATORY DATA:  I have reviewed the data as listed    Latest Ref Rng & Units 08/22/2022    9:44 AM 07/31/2022    9:29 AM 07/11/2022   10:31 AM  CBC  WBC 4.0 - 10.5 K/uL 9.4  8.1  9.3   Hemoglobin 12.0 - 15.0 g/dL 78.2  95.6  21.3   Hematocrit 36.0 - 46.0 % 43.7  42.2  41.4   Platelets 150 - 400 K/uL 172  182  212        Latest Ref Rng & Units 08/22/2022    9:44 AM 07/31/2022    9:29 AM 07/11/2022   10:31 AM  CMP  Glucose 70 - 99 mg/dL 086  88  88   BUN 8 - 23 mg/dL 9  13  12    Creatinine 0.44 - 1.00 mg/dL 5.78  4.69  6.29   Sodium 135 - 145 mmol/L 126  134  131   Potassium 3.5 - 5.1 mmol/L 3.5  3.7  3.8   Chloride 98 - 111 mmol/L 91  100  99   CO2 22 - 32 mmol/L 27  27  26    Calcium 8.9 - 10.3 mg/dL 8.6  8.7  8.7   Total Protein 6.5 - 8.1 g/dL 6.3  6.0  6.7   Total Bilirubin 0.3 - 1.2 mg/dL 0.4  0.4  0.4   Alkaline Phos 38 - 126 U/L 71  65  68   AST 15 - 41 U/L 12  11  11    ALT 0 - 44 U/L 12  8  7      No results found for: "MPROTEIN" Lab Results  Component Value Date   KPAFRELGTCHN 0.75 06/30/2014   LAMBDASER 3.78 (H) 06/30/2014   KAPLAMBRATIO 0.20 (L) 06/30/2014     RADIOGRAPHIC STUDIES: CT CHEST ABDOMEN PELVIS W CONTRAST  Result Date: 07/26/2022 CLINICAL DATA:  Metastatic small-cell lung cancer, assess treatment response, ongoing immunotherapy, breastbone and abdominal pain, diarrhea, possible blood in stool * Tracking Code: BO * EXAM: CT CHEST, ABDOMEN, AND PELVIS WITH CONTRAST TECHNIQUE: Multidetector CT imaging of the chest, abdomen and pelvis was performed following the standard protocol during bolus administration of intravenous contrast. RADIATION DOSE REDUCTION: This  exam was performed according to the departmental dose-optimization program which includes automated exposure control, adjustment of the mA and/or kV according to patient size  and/or use of iterative reconstruction technique. CONTRAST:  OMNIPAQUE IOHEXOL 300 MG/ML  SOLN COMPARISON:  MR abdomen, 05/22/2022, CT chest abdomen pelvis, 04/26/2022 FINDINGS: CT CHEST FINDINGS Cardiovascular: Right chest port catheter. Aortic atherosclerosis. Normal heart size. Left coronary artery calcifications. No pericardial effusion. Mediastinum/Nodes: No enlarged mediastinal, hilar, or axillary lymph nodes. Thyroid gland, trachea, and esophagus demonstrate no significant findings. Lungs/Pleura: Mild centrilobular and paraseptal emphysema. Unchanged elevation of the left hemidiaphragm. No pleural effusion or pneumothorax. Musculoskeletal: No chest wall abnormality. No acute osseous findings. CT ABDOMEN PELVIS FINDINGS Hepatobiliary: No focal liver abnormality is seen. Mildly coarse contour of the liver. Status post cholecystectomy. Unchanged postoperative biliary ductal dilatation Pancreas: Unremarkable. No pancreatic ductal dilatation or surrounding inflammatory changes. Spleen: Normal in size without significant abnormality. Adrenals/Urinary Tract: Normal right adrenal gland. Unchanged left adrenal nodule, previously characterized as a definitively benign macroscopic fat containing adenoma (series 2, image 52). Unchanged intermediate attenuation lesion of the posterior midportion of the right kidney, previously characterized as a definitively benign hemorrhagic or proteinaceous cyst, for which no specific further follow-up or characterization is required (series 8, image 18). Kidneys are normal, without renal calculi, solid lesion, or hydronephrosis. Bladder is unremarkable. Stomach/Bowel: Stomach is within normal limits. Status post appendectomy. No evidence of bowel wall thickening, distention, or inflammatory changes.  Descending and sigmoid diverticulosis. Very large burden of stool throughout the colon and rectum. Vascular/Lymphatic: Aortic atherosclerosis. No enlarged abdominal or pelvic lymph nodes. Reproductive: Status post hysterectomy. Other: No abdominal wall hernia or abnormality. No ascites. Musculoskeletal: No acute osseous findings. Status post right hip total arthroplasty. IMPRESSION: 1. No CT evidence of recurrent or metastatic disease in the chest, abdomen, or pelvis. 2. Very large burden of stool throughout the colon and rectum. Correlate for constipation. 3. Descending and sigmoid diverticulosis without evidence of acute diverticulitis. 4. Mildly coarse contour of the liver, suggestive of cirrhosis. Correlate with biochemical findings. 5. Emphysema. 6. Coronary artery disease. Aortic Atherosclerosis (ICD10-I70.0) and Emphysema (ICD10-J43.9). Electronically Signed   By: Jearld Lesch M.D.   On: 07/26/2022 21:07   ECHOCARDIOGRAM COMPLETE  Result Date: 07/24/2022    ECHOCARDIOGRAM REPORT   Patient Name:   NASHANTI DUQUETTE Date of Exam: 07/24/2022 Medical Rec #:  [id number]       Height:       62.5 in Accession #:    [id number]      Weight:       185.9 lb Date of Birth:  29-Nov-1955        BSA:          1.864 m Patient Age:    67 years        BP:           101/76 mmHg Patient Gender: F               HR:           88 bpm. Exam Location:  [address] Procedure: 2D Echo, Cardiac Doppler, Color Doppler and Strain Analysis Indications:    R06.09 Dyspnea  History:        Patient has prior history of Echocardiogram examinations, most                 recent 10/11/2020. COPD and small cell lung cancer; Risk                 Factors:Hypertension, HLD, Sleep Apnea, Diabetes and Former  Smoker.  Sonographer:    Clearence Ped RCS Referring Phys: [id number] Carlos Levering IMPRESSIONS  1. Left ventricular ejection fraction, by estimation, is 65 to 70%. The left ventricle has normal function. The left ventricle has no  regional wall motion abnormalities. Left ventricular diastolic parameters were normal. The average left ventricular global longitudinal strain is -19.6 %. The global longitudinal strain is normal.  2. Right ventricular systolic function is normal. The right ventricular size is normal.  3. The mitral valve is normal in structure. Trivial mitral valve regurgitation. No evidence of mitral stenosis.  4. The aortic valve is grossly normal. Aortic valve regurgitation is not visualized. No aortic stenosis is present.  5. The inferior vena cava is normal in size with greater than 50% respiratory variability, suggesting right atrial pressure of 3 mmHg. Comparison(s): No significant change from prior study. Conclusion(s)/Recommendation(s): Normal biventricular function without evidence of hemodynamically significant valvular heart disease. FINDINGS  Left Ventricle: Left ventricular ejection fraction, by estimation, is 65 to 70%. The left ventricle has normal function. The left ventricle has no regional wall motion abnormalities. The average left ventricular global longitudinal strain is -19.6 %. The global longitudinal strain is normal. The left ventricular internal cavity size was normal in size. There is no left ventricular hypertrophy. Left ventricular diastolic parameters were normal. Right Ventricle: The right ventricular size is normal. No increase in right ventricular wall thickness. Right ventricular systolic function is normal. Left Atrium: Left atrial size was normal in size. Right Atrium: Right atrial size was normal in size. Pericardium: There is no evidence of pericardial effusion. Mitral Valve: The mitral valve is normal in structure. Trivial mitral valve regurgitation. No evidence of mitral valve stenosis. Tricuspid Valve: The tricuspid valve is normal in structure. Tricuspid valve regurgitation is trivial. No evidence of tricuspid stenosis. Aortic Valve: The aortic valve is grossly normal. Aortic valve  regurgitation is not visualized. No aortic stenosis is present. Pulmonic Valve: The pulmonic valve was not well visualized. Pulmonic valve regurgitation is not visualized. Aorta: The aortic root, ascending aorta, aortic arch and descending aorta are all structurally normal, with no evidence of dilitation or obstruction. Venous: The inferior vena cava is normal in size with greater than 50% respiratory variability, suggesting right atrial pressure of 3 mmHg. IAS/Shunts: The atrial septum is grossly normal.  LEFT VENTRICLE PLAX 2D LVIDd:         4.30 cm   Diastology LVIDs:         2.70 cm   LV e' medial:    5.44 cm/s LV PW:         1.00 cm   LV E/e' medial:  10.4 LV IVS:        1.00 cm   LV e' lateral:   11.40 cm/s LVOT diam:     2.10 cm   LV E/e' lateral: 5.0 LV SV:         57 LV SV Index:   31        2D Longitudinal Strain LVOT Area:     3.46 cm  2D Strain GLS (A2C):   -19.3 %                          2D Strain GLS (A3C):   -19.3 %                          2D Strain GLS (A4C):   -20.2 %  2D Strain GLS Avg:     -19.6 % RIGHT VENTRICLE RV Basal diam:  3.30 cm RV S prime:     13.40 cm/s TAPSE (M-mode): 2.3 cm LEFT ATRIUM             Index        RIGHT ATRIUM           Index LA diam:        3.70 cm 1.98 cm/m   RA Area:     10.90 cm LA Vol (A2C):   53.7 ml 28.80 ml/m  RA Volume:   22.30 ml  11.96 ml/m LA Vol (A4C):   27.1 ml 14.54 ml/m LA Biplane Vol: 39.4 ml 21.13 ml/m  AORTIC VALVE LVOT Vmax:   95.80 cm/s LVOT Vmean:  62.600 cm/s LVOT VTI:    0.165 m  AORTA Ao Root diam: 2.90 cm Ao Asc diam:  3.40 cm MITRAL VALVE MV Area (PHT):             SHUNTS MV Decel Time:             Systemic VTI:  0.16 m MV E velocity: 56.60 cm/s  Systemic Diam: 2.10 cm MV A velocity: 85.40 cm/s MV E/A ratio:  0.66 Jodelle Red MD Electronically signed by Jodelle Red MD Signature Date/Time: 07/24/2022/9:55:08 PM    Final     ASSESSMENT & PLAN [name] 67 y.o. female with medical history  significant for extensive stage small cell lung cancer who presents for a follow up visit.   After review of the labs, review of the records, and discussion with the patient the patients findings are most consistent with extensive stage small cell lung cancer with metastasis from the right lower lobe to the lymph nodes of the abdomen.  At this time we will pursue triple therapy with carboplatin, etoposide, and atezolizumab.  After 4 cycles we will convert to maintenance atezolizumab alone.  We previously discussed the risks and benefits of this therapy and the patient was in agreement to proceed with this treatment.  The treatment of choice consist of carboplatin, etoposide, and atezolizumab.  The regimen consists of carboplatin AUC of 5 IV on day 1, etoposide 100 mg per metered squared IV on day 1, 2, and 3 and atezolizumab 1200 mg on day 1.  This continues for 21-day cycles.  After 4 cycles the patient proceeds with atezolizumab maintenance therapy alone.    # Small Cell Lung Cancer, Extensive Stage -- MRI of the brain shows no evidence of intracranial spread --Findings are currently consistent with metastatic small cell lung cancer with metastatic spread to the lymph nodes of the abdomen -- Started carboplatin, etoposide, and atezolizumab on 11/13/2020. Transitioned to maintenance Atezolizumab on 02/22/2021.  -- Most recent CT CAP from 04/26/2022 which shows no evidence of recurrence.  Plan: --today is Cycle 28 Day 1 of Atezolizumab maintenance. Patient would like to HOLD treatment due to fatigue.  --labs today were reviewed and adequate for treatment. Hgb 15.0, white blood cell count 9.4, MCV 84.9, and platelets of 172 --patient is willing and able to proceed with treatment at this time.  --CT scan to be collected every 3 months time.  Last scan on 07/26/2022 showed no evidence of residual recurrent disease.  Repeat scan due late Sept 2024 --RTC in 3 weeks for a follow up before Cycle 29.   #  Early Cirrhosis --noted on MR abdomen from 05/22/2022. [place] with elastography performed on 06/18/2022. --continue to  follow with GI.   #Right renal lesion: --Noted to be benign on most recent MRI of the abdomen.  #Diarrhea-improved --stool sent for C. Diff and GI pathology panel, all negative --patient following with GI and found to have pancreatic insufficiency and started on creon with improvement of symptoms.   #Nausea: --Symptoms improved with zofran and olanzapine.  --Added IV zofran to infusion premeds for better symptom control.   #Hypokalemia:  --currently taking potassium chloride 20 meq BID --potassium level is still 3.5 today. Continue PO supplementation.   #Lower extremity edema --Currently takes lasix 20 mg 1-2 times a day. --Stable. Continue to monitor.   #Neuropathy involving fingers/feet: --Patient currently takes Cymbalta   #Supportive Care -- chemotherapy education complete -- port placed -- zofran 8mg  q8H PRN and compazine 10mg  PO q6H for nausea -- EMLA cream for port  Orders Placed This Encounter  Procedures   CT CHEST ABDOMEN PELVIS W CONTRAST    Standing Status:   Future    Standing Expiration Date:   08/22/2023    Order Specific Question:   If indicated for the ordered procedure, I authorize the administration of contrast media per Radiology protocol    Answer:   Yes    Order Specific Question:   Does the patient have a contrast media/X-ray dye allergy?    Answer:   No    Order Specific Question:   Preferred imaging location?    Answer:   [facility]    Order Specific Question:   If indicated for the ordered procedure, I authorize the administration of oral contrast media per Radiology protocol    Answer:   Yes   All questions were answered. The patient knows to call the clinic with any problems, questions or concerns.  I have spent a total of 30 minutes minutes of face-to-face and non-face-to-face time, preparing to see the patient,  performing a medically appropriate examination, counseling and educating the patient, ordering medications/tests, documenting clinical information in the electronic health record, and care coordination.   Ulysees Barns, MD Department of Hematology/Oncology [facility] Phone: [phone] Pager: [phone] Email: [email or web address]  08/22/2022 2:39 PM

## 2022-08-22 NOTE — Patient Instructions (Signed)
[facility]  Discharge Instructions: Thank you for choosing [facility] to provide your oncology and hematology care.   If you have a lab appointment with the Cancer Center, please go directly to the Cancer Center and check in at the registration area.   Wear comfortable clothing and clothing appropriate for easy access to any Portacath or PICC line.   We strive to give you quality time with your provider. You may need to reschedule your appointment if you arrive late (15 or more minutes).  Arriving late affects you and other patients whose appointments are after yours.  Also, if you miss three or more appointments without notifying the office, you may be dismissed from the clinic at the provider's discretion.      For prescription refill requests, have your pharmacy contact our office and allow 72 hours for refills to be completed.    Today you received the following chemotherapy and/or immunotherapy agents Tecentriq      To help prevent nausea and vomiting after your treatment, we encourage you to take your nausea medication as directed.  BELOW ARE SYMPTOMS THAT SHOULD BE REPORTED IMMEDIATELY: *FEVER GREATER THAN 100.4 F (38 C) OR HIGHER *CHILLS OR SWEATING *NAUSEA AND VOMITING THAT IS NOT CONTROLLED WITH YOUR NAUSEA MEDICATION *UNUSUAL SHORTNESS OF BREATH *UNUSUAL BRUISING OR BLEEDING *URINARY PROBLEMS (pain or burning when urinating, or frequent urination) *BOWEL PROBLEMS (unusual diarrhea, constipation, pain near the anus) TENDERNESS IN MOUTH AND THROAT WITH OR WITHOUT PRESENCE OF ULCERS (sore throat, sores in mouth, or a toothache) UNUSUAL RASH, SWELLING OR PAIN  UNUSUAL VAGINAL DISCHARGE OR ITCHING   Items with * indicate a potential emergency and should be followed up as soon as possible or go to the Emergency Department if any problems should occur.  Please show the CHEMOTHERAPY ALERT CARD or IMMUNOTHERAPY ALERT CARD at  check-in to the Emergency Department and triage nurse.  Should you have questions after your visit or need to cancel or reschedule your appointment, please contact [facility]  Dept: [phone]  and follow the prompts.  Office hours are 8:00 a.m. to 4:30 p.m. Monday - Friday. Please note that voicemails left after 4:00 p.m. may not be returned until the following business day.  We are closed weekends and major holidays. You have access to a nurse at all times for urgent questions. Please call the main number to the clinic Dept: [phone] and follow the prompts.   For any non-urgent questions, you may also contact your provider using MyChart. We now offer e-Visits for anyone 18 and older to request care online for non-urgent symptoms. For details visit [email or web address].   Also download the MyChart app! Go to the app store, search "MyChart", open the app, select [place], and log in with your MyChart username and password. 

## 2022-08-22 NOTE — Progress Notes (Signed)
Nutrition Follow-up:  Patient with extensive stage small cell lung cancer. She is receiving maintenance therapy with atezolizumab q21d.   Met with pt in infusion. Pt continues to have poor appetite. Pt reports she sometimes goes 2-3 days without eating. She drinks water and coffee. Pt states she does eat if she gets hungry and prepares foods she has a taste for. Recently it has been creamed corn and potatoes. This "runs right through" her. Pt has a bacon and egg sandwich for breakfast on occasion. Pt becoming tearful reports her beloved dog is not doing well. This is a difficult time for her.    Medications: reviewed   Labs: Na 126, glucose 105, Cr 1.09  Anthropometrics: Weights continue trending down. Pt 180 lb 6.4 oz today decreased 3% in 3 weeks, 8% wt loss in the last 9 weeks - this is severe for time frame  7/3 - 185 lb 9.6 oz 6/13 - 185 lb 14.4 oz  5/22 - 190 lb 8 oz 5/10 - 196 lb 6 oz    NUTRITION DIAGNOSIS: Inadequate oral intake continues    INTERVENTION:  Supportive listening and encouragement Educated on importance of daily nutrition. Continued encouragement for small meals and snacks  Consider appetite stimulant If pt agreeable, recommend LCSW assessment/support    MONITORING, EVALUATION, GOAL: weight trends, intake   NEXT VISIT: Wednesday August 14 during infusion

## 2022-08-24 ENCOUNTER — Other Ambulatory Visit: Payer: Self-pay | Admitting: Physician Assistant

## 2022-08-28 ENCOUNTER — Telehealth: Payer: Self-pay | Admitting: Hematology and Oncology

## 2022-08-29 ENCOUNTER — Encounter: Payer: Self-pay | Admitting: Dermatology

## 2022-08-29 ENCOUNTER — Telehealth: Payer: Self-pay | Admitting: Hematology and Oncology

## 2022-09-02 ENCOUNTER — Other Ambulatory Visit: Payer: Self-pay | Admitting: Physician Assistant

## 2022-09-02 NOTE — Telephone Encounter (Signed)
Prescription Request  09/02/2022  LOV: 04/15/2022  What is the name of the medication or equipment? ALPRAZolam [name]) 1 MG tablet   Have you contacted your pharmacy to request a refill? Yes   Which pharmacy would you like this sent to?  [facility] [place], [place] - [address] [place] [address] Phone: [phone] Fax: [phone]    Patient notified that their request is being sent to the clinical staff for review and that they should receive a response within 2 business days.   Please advise at Mobile [phone] (mobile)

## 2022-09-03 MED ORDER — ALPRAZOLAM 1 MG PO TABS: 1.0000 mg | ORAL_TABLET | Freq: Three times a day (TID) | ORAL | 2 refills | Status: DC | PRN

## 2022-09-03 NOTE — Telephone Encounter (Addendum)
Please review  Medication: Alprazolam (Xanax) 1 mg  Directions: Take 1 tablet by mouth TID as needed for Anxiety Last given: 05/23/22 Number refills: 2 Last o/v: 04/15/22 Follow up: 09/05/22 Labs:   Called patient to confirm how medication is being taken and patient stated that she has had to use it more that usual.

## 2022-09-03 NOTE — Addendum Note (Signed)
Addended by: Dorris Fetch on: 09/03/2022 04:21 PM   Modules accepted: Orders

## 2022-09-04 ENCOUNTER — Encounter: Payer: Self-pay | Admitting: Physical Medicine and Rehabilitation

## 2022-09-04 ENCOUNTER — Encounter: Payer: 59 | Attending: Physical Medicine and Rehabilitation | Admitting: Physical Medicine and Rehabilitation

## 2022-09-04 VITALS — BP 105/73 | HR 92 | Ht 62.0 in | Wt 186.0 lb

## 2022-09-04 DIAGNOSIS — M7918 Myalgia, other site: Secondary | ICD-10-CM | POA: Diagnosis not present

## 2022-09-04 DIAGNOSIS — G43119 Migraine with aura, intractable, without status migrainosus: Secondary | ICD-10-CM | POA: Diagnosis not present

## 2022-09-04 DIAGNOSIS — G894 Chronic pain syndrome: Secondary | ICD-10-CM | POA: Insufficient documentation

## 2022-09-04 MED ORDER — HYDROCODONE-ACETAMINOPHEN 10-325 MG PO TABS: 1.0000 | ORAL_TABLET | ORAL | 0 refills | Status: DC | PRN

## 2022-09-04 MED ORDER — LIDOCAINE HCL 1 % IJ SOLN
3.0000 mL | Freq: Once | INTRAMUSCULAR | Status: AC
Start: 2022-09-04 — End: 2022-09-05
  Administered 2022-09-05: 3 mL

## 2022-09-04 NOTE — Progress Notes (Signed)
Pt is a 67 yr old female with hx of newly dx'd COPD, still smoking, HTN, DM- diet controlled; anxiety and depression;  Scoliosis and deg back issues,  Generalized OA;  chronic pain- here for f/u of chronic pain issues. With new 2.2 cm R lower pulmonary nodule and hemorrhoids.  Now has dx of Small cell lung cancer extensive stage per oncology note. Here for f/u on chronic pain.     Thinks dizziness is from migraine -started ~ 30 minutes ago.   Has lowered her expectations of herself- spending more time with bloodhound.   Has had 7 migraines since saw me last- ~ q1-2 weeks.    Nurtec works great! Hates the packaging.     Plan: Patient here for trigger point injections for  Consent done and on chart.  Cleaned areas with alcohol and injected using a 27 gauge 1.5 inch needle  Injected  3cc- none wasted Using 1% Lidocaine with no EPI  Upper traps B?L x2 Levators- B/L  Posterior scalenes Middle scalenes- B/L  Splenius Capitus- B/L  Pectoralis Major-didn't need Rhomboids- B/L x2 Infraspinatus Teres Major/minor Thoracic paraspinals Lumbar paraspinals Other injections-    Patient's level of pain prior was  was dizzy and getting migraine- was pushing 9/10 Current level of pain after injections is down to 8/10- dizziness is better after injections as well.  Floating and relaxing muscles  There was no bleeding or complications.  Patient was advised to drink a lot of water on day after injections to flush system Will have increased soreness for 12-48 hours after injections.  Can use Lidocaine patches the day AFTER injections Can use theracane on day of injections in places didn't inject Can use heating pad 4-6 hours AFTER injections  2. Con't Nurtec- to use for migraines with aura/dizziness.   3. Con't Norco- 10/325 mg - #150 tabs- sne tin   4. Holding her own with  cancer treatment!!!   5. Depression is doing better- so that's great-    6. F/ U in 3 months- with  trigger point injections.    I spent a total of 32   minutes on total care today- >50% coordination of care- due to  10 minutes on trigger points and rest on discussing her pain meds; and cancer treatment as well as decreased depression.

## 2022-09-04 NOTE — Patient Instructions (Addendum)
Plan: Patient here for trigger point injections for  Consent done and on chart.  Cleaned areas with alcohol and injected using a 27 gauge 1.5 inch needle  Injected  3cc- none wasted Using 1% Lidocaine with no EPI  Upper traps B?L x2 Levators- B/L  Posterior scalenes Middle scalenes- B/L  Splenius Capitus- B/L  Pectoralis Major-didn't need Rhomboids- B/L x2 Infraspinatus Teres Major/minor Thoracic paraspinals Lumbar paraspinals Other injections-    Patient's level of pain prior was  was dizzy and getting migraine- was pushing 9/10 Current level of pain after injections is down to 8/10- dizziness is better after injections as well.  Floating and relaxing muscles  There was no bleeding or complications.  Patient was advised to drink a lot of water on day after injections to flush system Will have increased soreness for 12-48 hours after injections.  Can use Lidocaine patches the day AFTER injections Can use theracane on day of injections in places didn't inject Can use heating pad 4-6 hours AFTER injections  2. Con't Nurtec- to use for migraines with aura/dizziness.   3. Con't Norco- 10/325 mg - #150 tabs- sne tin   4. Holding her own with cancer!!!   5. Depression is doing better- so that's great-    6. F/ U in 3 months- with trigger point injections.

## 2022-09-05 ENCOUNTER — Telehealth (INDEPENDENT_AMBULATORY_CARE_PROVIDER_SITE_OTHER): Payer: 59 | Admitting: Physician Assistant

## 2022-09-05 ENCOUNTER — Encounter: Payer: Self-pay | Admitting: Physician Assistant

## 2022-09-05 VITALS — BP 120/72 | HR 89 | Wt 184.0 lb

## 2022-09-05 DIAGNOSIS — F419 Anxiety disorder, unspecified: Secondary | ICD-10-CM | POA: Diagnosis not present

## 2022-09-05 DIAGNOSIS — E871 Hypo-osmolality and hyponatremia: Secondary | ICD-10-CM

## 2022-09-05 DIAGNOSIS — G894 Chronic pain syndrome: Secondary | ICD-10-CM | POA: Diagnosis not present

## 2022-09-05 DIAGNOSIS — G43119 Migraine with aura, intractable, without status migrainosus: Secondary | ICD-10-CM | POA: Diagnosis not present

## 2022-09-05 DIAGNOSIS — C3490 Malignant neoplasm of unspecified part of unspecified bronchus or lung: Secondary | ICD-10-CM

## 2022-09-05 DIAGNOSIS — F32A Depression, unspecified: Secondary | ICD-10-CM | POA: Diagnosis not present

## 2022-09-05 DIAGNOSIS — M7918 Myalgia, other site: Secondary | ICD-10-CM | POA: Diagnosis not present

## 2022-09-05 DIAGNOSIS — I10 Essential (primary) hypertension: Secondary | ICD-10-CM | POA: Diagnosis not present

## 2022-09-05 NOTE — Progress Notes (Signed)
   Virtual Visit via Video Note  I connected with  [name]  on 09/06/22 at 12:30 PM EDT by a video enabled telemedicine application and verified that I am speaking with the correct person using two identifiers.  Location: Patient: home Provider: Nature conservation officer at [employer] Persons present: Patient and myself   I discussed the limitations of evaluation and management by telemedicine and the availability of in person appointments. The patient expressed understanding and agreed to proceed.   History of Present Illness:  Virtual visit follow-up with my pleasant 67 year old patient who is currently undergoing active treatment for metastatic small cell lung cancer & defying all odds. States that she has been getting out and driving herself, huge accomplishment for her.  Feels like her moods are in a much better place than they have ever been.  She tries to stay positive.  She has been working at home to do just 1 task per day so that she does not overdo anything.  Pain stays between 7-8/10 level, says she grins and bears it; follows with Dr. Berline Chough for pain. She will [facility] to get [facility] setup.   She does state that she has been feeling more tired and loses energy quickly.  Her blood pressure has been having some low readings recently.  Her visit yesterday was 105/73.  Denies any other symptoms going on right now.   Observations/Objective:   Gen: Awake, alert, no acute distress Resp: Breathing is even and non-labored Psych: calm/pleasant demeanor Neuro: Alert and Oriented x 3, + facial symmetry, speech is clear.   Assessment and Plan:  Problem List Items Addressed This Visit       Cardiovascular and Mediastinum   HYPERTENSION, BENIGN (Chronic)    Aside from her cancer status, I do wonder if low blood pressure readings are contributing to some of her tiredness.  I also think her blood pressure medication is causing her hyponatremia, which was  recently worse on last labs.  I have asked her to stop the Maxide-25 mg tablet at this time and monitor her readings closely at home.  She states that she will follow-up in office with her oncologist next week and let me know how she is doing staying off the medication.  She will also have labs redone at that visit she says.        Respiratory   Small cell lung cancer (HCC)     Other   Anxiety and depression - Primary    Cymbalta 120 mg daily. Xanax 1 mg TID daily. Stable, doing well at this time.  PDMP reviewed today, no red flags, filling appropriately.        Hyponatremia    See HTN plan - stop Maxzide         Follow Up Instructions:  F/up 4 months or prn.    I discussed the assessment and treatment plan with the patient. The patient was provided an opportunity to ask questions and all were answered. The patient agreed with the plan and demonstrated an understanding of the instructions.   The patient was advised to call back or seek an in-person evaluation if the symptoms worsen or if the condition fails to improve as anticipated.   M , PA-C

## 2022-09-06 ENCOUNTER — Other Ambulatory Visit: Payer: Self-pay | Admitting: Hematology and Oncology

## 2022-09-06 DIAGNOSIS — E871 Hypo-osmolality and hyponatremia: Secondary | ICD-10-CM | POA: Insufficient documentation

## 2022-09-06 DIAGNOSIS — C3490 Malignant neoplasm of unspecified part of unspecified bronchus or lung: Secondary | ICD-10-CM

## 2022-09-06 NOTE — Assessment & Plan Note (Signed)
Aside from her cancer status, I do wonder if low blood pressure readings are contributing to some of her tiredness.  I also think her blood pressure medication is causing her hyponatremia, which was recently worse on last labs.  I have asked her to stop the Maxide-25 mg tablet at this time and monitor her readings closely at home.  She states that she will follow-up in office with her oncologist next week and let me know how she is doing staying off the medication.  She will also have labs redone at that visit she says.

## 2022-09-06 NOTE — Assessment & Plan Note (Signed)
Cymbalta 120 mg daily. Xanax 1 mg TID daily. Stable, doing well at this time.  PDMP reviewed today, no red flags, filling appropriately.

## 2022-09-06 NOTE — Assessment & Plan Note (Signed)
See HTN plan - stop Maxzide

## 2022-09-10 ENCOUNTER — Inpatient Hospital Stay: Payer: 59 | Attending: Hematology and Oncology | Admitting: Nurse Practitioner

## 2022-09-10 ENCOUNTER — Inpatient Hospital Stay: Payer: 59

## 2022-09-10 ENCOUNTER — Other Ambulatory Visit: Payer: Self-pay | Admitting: Physician Assistant

## 2022-09-10 ENCOUNTER — Inpatient Hospital Stay: Payer: 59 | Admitting: Dietician

## 2022-09-10 VITALS — BP 133/87 | HR 91 | Temp 97.6°F | Resp 16 | Wt 189.6 lb

## 2022-09-10 VITALS — BP 113/89 | HR 84 | Resp 16

## 2022-09-10 DIAGNOSIS — Z5112 Encounter for antineoplastic immunotherapy: Secondary | ICD-10-CM | POA: Diagnosis not present

## 2022-09-10 DIAGNOSIS — Z882 Allergy status to sulfonamides status: Secondary | ICD-10-CM | POA: Diagnosis not present

## 2022-09-10 DIAGNOSIS — B370 Candidal stomatitis: Secondary | ICD-10-CM | POA: Diagnosis not present

## 2022-09-10 DIAGNOSIS — Z880 Allergy status to penicillin: Secondary | ICD-10-CM | POA: Insufficient documentation

## 2022-09-10 DIAGNOSIS — Z79899 Other long term (current) drug therapy: Secondary | ICD-10-CM | POA: Insufficient documentation

## 2022-09-10 DIAGNOSIS — Z885 Allergy status to narcotic agent status: Secondary | ICD-10-CM | POA: Insufficient documentation

## 2022-09-10 DIAGNOSIS — J984 Other disorders of lung: Secondary | ICD-10-CM | POA: Insufficient documentation

## 2022-09-10 DIAGNOSIS — Z87891 Personal history of nicotine dependence: Secondary | ICD-10-CM | POA: Diagnosis not present

## 2022-09-10 DIAGNOSIS — C3490 Malignant neoplasm of unspecified part of unspecified bronchus or lung: Secondary | ICD-10-CM

## 2022-09-10 DIAGNOSIS — R490 Dysphonia: Secondary | ICD-10-CM | POA: Insufficient documentation

## 2022-09-10 DIAGNOSIS — Z888 Allergy status to other drugs, medicaments and biological substances status: Secondary | ICD-10-CM | POA: Insufficient documentation

## 2022-09-10 DIAGNOSIS — R0989 Other specified symptoms and signs involving the circulatory and respiratory systems: Secondary | ICD-10-CM | POA: Insufficient documentation

## 2022-09-10 DIAGNOSIS — Z886 Allergy status to analgesic agent status: Secondary | ICD-10-CM | POA: Diagnosis not present

## 2022-09-10 DIAGNOSIS — R300 Dysuria: Secondary | ICD-10-CM | POA: Insufficient documentation

## 2022-09-10 DIAGNOSIS — C3431 Malignant neoplasm of lower lobe, right bronchus or lung: Secondary | ICD-10-CM | POA: Insufficient documentation

## 2022-09-10 LAB — URINALYSIS, COMPLETE (UACMP) WITH MICROSCOPIC
Bilirubin Urine: NEGATIVE
Glucose, UA: NEGATIVE mg/dL
Hgb urine dipstick: NEGATIVE
Ketones, ur: 5 mg/dL — AB
Nitrite: NEGATIVE
Protein, ur: NEGATIVE mg/dL
Specific Gravity, Urine: 1.02 (ref 1.005–1.030)
WBC, UA: >50 WBC/hpf (ref 0–5)
pH: 5 (ref 5.0–8.0)

## 2022-09-10 LAB — CBC WITH DIFFERENTIAL (CANCER CENTER ONLY)
Abs Immature Granulocytes: 0.05 10*3/uL (ref 0.00–0.07)
Basophils Absolute: 0 10*3/uL (ref 0.0–0.1)
Basophils Relative: 1 %
Eosinophils Absolute: 0.3 10*3/uL (ref 0.0–0.5)
Eosinophils Relative: 5 %
HCT: 39.6 % (ref 36.0–46.0)
Hemoglobin: 13.1 g/dL (ref 12.0–15.0)
Immature Granulocytes: 1 %
Lymphocytes Relative: 35 %
Lymphs Abs: 2.3 10*3/uL (ref 0.7–4.0)
MCH: 30.1 pg (ref 26.0–34.0)
MCHC: 33.1 g/dL (ref 30.0–36.0)
MCV: 91 fL (ref 80.0–100.0)
Monocytes Absolute: 0.4 10*3/uL (ref 0.1–1.0)
Monocytes Relative: 7 %
Neutro Abs: 3.4 10*3/uL (ref 1.7–7.7)
Neutrophils Relative %: 51 %
Platelet Count: 175 10*3/uL (ref 150–400)
RBC: 4.35 MIL/uL (ref 3.87–5.11)
RDW: 20.9 % — ABNORMAL HIGH (ref 11.5–15.5)
WBC Count: 6.5 10*3/uL (ref 4.0–10.5)
nRBC: 0 % (ref 0.0–0.2)

## 2022-09-10 LAB — CMP (CANCER CENTER ONLY)
ALT: 10 U/L (ref 0–44)
AST: 11 U/L — ABNORMAL LOW (ref 15–41)
Albumin: 3.2 g/dL — ABNORMAL LOW (ref 3.5–5.0)
Alkaline Phosphatase: 71 U/L (ref 38–126)
Anion gap: 4 — ABNORMAL LOW (ref 5–15)
BUN: 9 mg/dL (ref 8–23)
CO2: 25 mmol/L (ref 22–32)
Calcium: 7.9 mg/dL — ABNORMAL LOW (ref 8.9–10.3)
Chloride: 110 mmol/L (ref 98–111)
Creatinine: 0.94 mg/dL (ref 0.44–1.00)
GFR, Estimated: >60 mL/min (ref >60)
Glucose, Bld: 101 mg/dL — ABNORMAL HIGH (ref 70–99)
Potassium: 4 mmol/L (ref 3.5–5.1)
Sodium: 139 mmol/L (ref 135–145)
Total Bilirubin: 0.3 mg/dL (ref 0.3–1.2)
Total Protein: 5.5 g/dL — ABNORMAL LOW (ref 6.5–8.1)

## 2022-09-10 LAB — TSH: TSH: 0.237 u[IU]/mL — ABNORMAL LOW (ref 0.350–4.500)

## 2022-09-10 MED ORDER — ONDANSETRON HCL 4 MG/2ML IJ SOLN
8.0000 mg | Freq: Once | INTRAMUSCULAR | Status: AC
  Administered 2022-09-10: 8 mg via INTRAVENOUS
  Filled 2022-09-10: qty 4

## 2022-09-10 MED ORDER — OLANZAPINE 10 MG PO TABS: 10.0000 mg | ORAL_TABLET | Freq: Every day | ORAL | 2 refills | Status: AC

## 2022-09-10 MED ORDER — SODIUM CHLORIDE 0.9 % IV SOLN
1200.0000 mg | Freq: Once | INTRAVENOUS | Status: AC
  Administered 2022-09-10: 1200 mg via INTRAVENOUS
  Filled 2022-09-10: qty 20

## 2022-09-10 MED ORDER — SODIUM CHLORIDE 0.9% FLUSH
10.0000 mL | INTRAVENOUS | Status: DC | PRN
  Administered 2022-09-10: 10 mL

## 2022-09-10 MED ORDER — NYSTATIN 100000 UNIT/ML MT SUSP
OROMUCOSAL | 0 refills | Status: DC
Start: 2022-09-10 — End: 2023-02-07

## 2022-09-10 MED ORDER — SODIUM CHLORIDE 0.9 % IV SOLN: Freq: Once | INTRAVENOUS | Status: AC

## 2022-09-10 MED ORDER — HEPARIN SOD (PORK) LOCK FLUSH 100 UNIT/ML IV SOLN
500.0000 [IU] | Freq: Once | INTRAVENOUS | Status: AC | PRN
  Administered 2022-09-10: 500 [IU]

## 2022-09-10 MED ORDER — FLUCONAZOLE 150 MG PO TABS
ORAL_TABLET | ORAL | 0 refills | Status: DC
Start: 2022-09-10 — End: 2022-10-10

## 2022-09-10 NOTE — Progress Notes (Addendum)
Patient Care Team: Allwardt, Crist Infante, PA-C as PCP - General (Physician Assistant) O'Neal, Ronnald Ramp, MD as PCP - Cardiology (Cardiology) Rachael Fee, MD as Attending Physician (Gastroenterology) Dr. Louie Bun, MD as Consulting Physician (Orthopedic Surgery) Ranee Gosselin, MD as Consulting Physician (Orthopedic Surgery) York Spaniel, MD (Inactive) as Consulting Physician (Neurology) Smitty Cords, OD as Consulting Physician (Optometry) Karel Jarvis Lesle Chris, MD as Consulting Physician (Neurology) Erroll Luna, [facility] (Inactive) (Pharmacist)  Clinic Day:  09/11/2022  Referring physician: Jaci Standard, MD  ASSESSMENT & PLAN:   Assessment & Plan: Small cell lung cancer (HCC) Small Cell Lung Cancer, Extensive Stage 07/05/2020: CT abdomen for lower abdominal pain. New pulmonary nodular density noted 07/06/2020: CT chest showed 2.2 cm macrolobulated right lower lobe pulmonary nodule (favored) versus pathologically enlarged infrahilar lymph node 10/13/2020: PET CT scan performed, findings show 2 cm right lower lobe lung mass is hypermetabolic and consistent with primary lung neoplasm. Additionally found hypermetabolic 17 mm soft tissue lesion between the descending duodenum and the pancreatic head  10/19/2020: EGD to biopsy hypermetabolic lymph node. Biopsy results show small cell lung cancer 10/26/2020: establish care with Dr. Leonides Schanz  11/13/2020: Cycle 1 Day 1 of Carbo/Etop/Atezolizumab 12/04/2020: Cycle 2 Day 1 of Carbo/Etop/Atezolizumab 11/22-11/25/2022: admitted for E. Coli bacteremia/sepsis. Start of Cycle 3 delayed. 01/02/2021: Cycle 3 Day 1 of Carbo/Etop/Atezolizumab 01/23/2021: Cycle 4 Day 1 of Carbo/Etop/Atezolizumab 02/22/2021: Cycle 5 Day 1 of Atezolizumab Maintenance. Delayed start due to patient's COVID infection.  03/14/2021: Cycle 6 Day 1 of Atezolizumab Maintenance 04/05/2021: Cycle 7 Day 1 of Atezolizumab Maintenance 04/25/2021: Cycle 8 Day 1 of  Atezolizumab Maintenance 05/16/2021: Cycle 9 Day 1 of Atezolizumab Maintenance 06/06/2021: Cycle 10 Day 1 of Atezolizumab Maintenance 06/27/2021:  Cycle 11 Day 1 of Atezolizumab Maintenance 07/18/2021: Cycle 12 Day 1 of Atezolizumab Maintenance 08/08/2021: Cycle 13 Day 1 of Atezolizumab Maintenance 08/29/2021: Cycle 14 Day 1 of Atezolizumab Maintenance 09/19/2021: Cycle 15 Day 1 of Atezolizumab Maintenance 10/10/2021: Cycle 16 Day 1 of Atezolizumab Maintenance 10/31/2021: Cycle 17 Day 1 of Atezolizumab Maintenance 11/21/2021: Cycle 18 Day 1 of Atezolizumab Maintenance 12/12/2021: Cycle 19 Day 1 of Atezolizumab Maintenance 01/02/2022: Cycle 20 Day 1 of Atezolizumab Maintenance 01/23/2022: Cycle 21 Day 1 of Atezolizumab Maintenance 02/13/2022: treatment HELD due to diarrhea.  03/06/2022: Cycle 22 Day 1 of Atezolizumab Maintenance 03/27/2022: Cycle 23 Day 1 of Atezolizumab Maintenance 04/17/2022: Cycle 24 Day 1 of Atezolizumab Maintenance 05/08/2022:  Cycle 25 Day 1 of Atezolizumab Maintenance (Held due to patient preference for treatment holiday) 05/29/2022: Cycle 25 Day 1 of Atezolizumab Maintenance  06/19/2022: Cycle 26 Day 1 of Atezolizumab Maintenance  07/12/2022: Cycle 27 Day 1 of Atezolizumab Maintenance 07/31/2022: Cycle 28 Day 1 of Atezolizumab Maintenance HELD per patient request for fatigue.  08/22/2022: Cycle 28 Day 1 of Atezolizumab Maintenance 09/10/2022 - Cycle 29 Day 1 of Atezolizumab maintenance   Thrush Patient reporting soreness of tongue and inside cheeks.  Scratchy throat, hoarse voice.  States that she has gone several days without rinsing out her mouth after using inhalers. This has caused thrush infections in the past.  -diflucan 150 mg once. May repeat dose in 3 days for persistent symptoms.  -nystatin suspension. Advised her to swish and swallow 5 mls up to 4 times daily for next 7 to 10 days.   Dysuria While in infusion suite, patient mentioned having pain when urinating and  urinary frequency.  -collect urine sample today to evaluate for infection. Will treat as indicated  based on results.    Plan -  Treat thrush with diflucan 150 mg once, repeating in 3 days for persistent symptoms. Add nystatin suspension. Swish and swallow 5 mls up to 4 times daily as needed for next 7 to 10 days. Collect urine sample to evaluate for infection and treat as indicated.  Review labs -WBC 6.5; Hgb 13.1; Hct 39.6; Plt 175; and anc 3.4 -Ca 7.9; normal renal functions; eGFR >60; normal LFTs  Proceed with Cycle 29 day 1 atezolizumab maintenance  Labs/flush, follow up, and infusion as scheduled.   The patient understands the plans discussed today and is in agreement with them.  She knows to contact our office if she develops concerns prior to her next appointment.  I provided 30 minutes of face-to-face time during this encounter and > 50% was spent counseling as documented under my assessment and plan.    Carlean Jews, NP  [facility] [address] [place] [address] Dept: [phone] Dept Fax: [phone]   Orders Placed This Encounter  Procedures   Urinalysis, Complete w Microscopic    Standing Status:   Future    Number of Occurrences:   1    Standing Expiration Date:   09/10/2023      CHIEF COMPLAINT:  CC: extensive small cell lung cancer   Current Treatment:  atezolizumab maintenance   INTERVAL HISTORY:  [name] is here today for repeat clinical assessment. She denies fevers or chills. She denies pain. Her appetite is good. Her weight has increased 5 pounds over last month .  Extensive small cell lung cancer -doing well on  Atezolizumab maintenance every 3 weeks.  -labs stable. -treat for thrush  -evaluate for urinary tract infection and treat as indicated  Labs/flush, follow up, and maintenance infusion in 3 weeks as scheduled.   I have reviewed the past medical history, past  surgical history, social history and family history with the patient and they are unchanged from previous note.  ALLERGIES:  is allergic to metformin and related, nsaids, wellbutrin [bupropion], aleve [naproxen sodium], codeine, penicillins, and sulfonamide derivatives.  MEDICATIONS:  Current Outpatient Medications  Medication Sig Dispense Refill   fluconazole (DIFLUCAN) 150 MG tablet Take 1 tablet po once. May repeat dose in 3 days prn persistent symptoms. 3 tablet 0   nystatin (MYCOSTATIN) 100000 UNIT/ML suspension Swish and swallow with 5 ml up to QID prn for thrush 400 mL 0   albuterol (PROAIR HFA) 108 (90 Base) MCG/ACT inhaler 2 puffs every 4 hours as needed only  if your can't catch your breath 18 g 5   ALPRAZolam (XANAX) 1 MG tablet Take 1 tablet (1 mg total) by mouth 3 (three) times daily as needed. for anxiety 90 tablet 2   atorvastatin (LIPITOR) 20 MG tablet TAKE 1 TABLET BY MOUTH AT BEDTIME 90 tablet 1   benzonatate (TESSALON) 100 MG capsule Take 1 capsule (100 mg total) by mouth 3 (three) times daily. 90 capsule 1   calcipotriene (DOVONOX) 0.005 % cream Apply to affected area bid when on break from steroid cream 120 g 3   cyclobenzaprine (FLEXERIL) 10 MG tablet Take 10 mg by mouth 3 (three) times daily as needed.     dicyclomine (BENTYL) 10 MG capsule TAKE ONE CAPSULE BY MOUTH TWICE DAILY 60 capsule 1   diphenoxylate-atropine (LOMOTIL) 2.5-0.025 MG tablet Take 1 tablet by mouth 4 (four) times daily as needed for diarrhea or loose stools.  60 tablet 0   DULoxetine (CYMBALTA) 60 MG capsule TAKE TWO CAPSULES BY MOUTH EVERY DAY 180 capsule 1   FEROSUL 325 (65 Fe) MG tablet Take 325 mg by mouth daily with breakfast.     fluticasone (FLONASE) 50 MCG/ACT nasal spray INSTILL 2 SPRAYS IN EACH NOSTRIL EVERY DAY 16 g 6   Fluticasone-Umeclidin-Vilant (TRELEGY ELLIPTA) 200-62.5-25 MCG/ACT AEPB Inhale 1 puff into the lungs daily at 2 PM. 60 each 5   furosemide (LASIX) 20 MG tablet TAKE 1 TABLET BY  MOUTH TWICE DAILY, IF SWELLING IMPROVES CAN HOLD MEDICATION 90 tablet 0   HYDROcodone-acetaminophen (NORCO) 10-325 MG tablet Take 1 tablet by mouth every 4 (four) hours as needed. 150 tablet 0   hydrOXYzine (ATARAX) 10 MG tablet Take 1 tablet (10 mg total) by mouth 3 (three) times daily as needed for itching. 30 tablet 0   levothyroxine (SYNTHROID) 137 MCG tablet Take 1 tablet (137 mcg total) by mouth daily before breakfast. 90 tablet 1   loratadine (CLARITIN) 10 MG tablet Take 10 mg by mouth daily.     meloxicam (MOBIC) 15 MG tablet TAKE 1 TABLET BY MOUTH EVERY DAY 30 tablet 2   nitrofurantoin, macrocrystal-monohydrate, (MACROBID) 100 MG capsule Take 1 capsule (100 mg total) by mouth 2 (two) times daily. 14 capsule 0   OLANZapine (ZYPREXA) 10 MG tablet Take 1 tablet (10 mg total) by mouth at bedtime. 30 tablet 2   omeprazole (PRILOSEC) 40 MG capsule TAKE ONE CAPSULE BY MOUTH TWICE DAILY 180 capsule 3   ondansetron (ZOFRAN) 8 MG tablet Take 1 tablet (8 mg total) by mouth 2 (two) times daily. 60 tablet 1   permethrin (ELIMITE) 5 % cream      potassium chloride SA (KLOR-CON M) 20 MEQ tablet TAKE 1 TABLET BY MOUTH TWICE DAILY 60 tablet 2   prochlorperazine (COMPAZINE) 10 MG tablet Take 1 tablet (10 mg total) by mouth every 6 (six) hours as needed for nausea or vomiting. 30 tablet 0   Rimegepant Sulfate (NURTEC) 75 MG TBDP Take 1 tablet (75 mg total) by mouth daily as needed (for migraine-). Take within 15 minutes of initial symptoms 8 tablet 5   topiramate (TOPAMAX) 25 MG tablet Take 1 tablet (25 mg total) by mouth at bedtime. X 1 week, then 50 mg QHS x 1 week, then 75 mg QHS x 1 week, then 100 mg QHS- stop increasing if migraines dramatically improve- for migraine prevention 120 tablet 5   triamcinolone ointment (KENALOG) 0.5 % Apply 1 Application topically 3 (three) times daily. Apply thin-layer on affected areas, no more than 2 weeks consistently. 60 g 2   triamterene-hydrochlorothiazide (MAXZIDE-25)  37.5-25 MG tablet TAKE 1 TABLET BY MOUTH DAILY 90 tablet 2   No current facility-administered medications for this visit.    HISTORY OF PRESENT ILLNESS:   Oncology History  Small cell lung cancer (HCC)  10/27/2020 Initial Diagnosis   Small cell lung cancer (HCC)   10/27/2020 Cancer Staging   Staging form: Lung, AJCC 8th Edition - Clinical: Stage IVA (cT1c, cN0, cM1b) - Signed by Jaci Standard, MD on 10/27/2020 Stage prefix: Initial diagnosis   11/13/2020 - 09/19/2021 Chemotherapy   Patient is on Treatment Plan : LUNG SCLC Carboplatin + Etoposide + Atezolizumab Induction q21d / Atezolizumab Maintenance q21d     11/13/2020 -  Chemotherapy   Patient is on Treatment Plan : LUNG SCLC Carboplatin + Etoposide + Atezolizumab Induction q21d x 4 cycles / Atezolizumab Maintenance q21d  REVIEW OF SYSTEMS:   Constitutional: Denies fevers, chills or abnormal weight loss Eyes: Denies blurriness of vision Ears, nose, mouth, throat, and face: Denies mucositis or sore throat Respiratory: Denies cough, dyspnea or wheezes Cardiovascular: Denies palpitation, chest discomfort or lower extremity swelling Gastrointestinal:  Denies nausea, heartburn or change in bowel habits Skin: Denies abnormal skin rashes Lymphatics: Denies new lymphadenopathy or easy bruising Neurological:Denies numbness, tingling or new weaknesses Behavioral/Psych: Mood is stable, no new changes  Genitourinary - reports burning sensation when urinating. Having urinary frequency.  All other systems were reviewed with the patient and are negative.   VITALS:   Today's Vitals   09/10/22 1345  BP: 133/87  Pulse: 91  Resp: 16  Temp: 97.6 F (36.4 C)  TempSrc: Oral  SpO2: 97%  Weight: 189 lb 9.6 oz (86 kg)   Body mass index is 34.68 kg/m.   Wt Readings from Last 3 Encounters:  09/10/22 189 lb 9.6 oz (86 kg)  09/05/22 184 lb (83.5 kg)  09/04/22 186 lb (84.4 kg)    Body mass index is 34.68  kg/m.  Performance status (ECOG): 1 - Symptomatic but completely ambulatory  PHYSICAL EXAM:   GENERAL:alert, no distress and comfortable SKIN: skin color, texture, turgor are normal, no rashes or significant lesions EYES: normal, Conjunctiva are pink and non-injected, sclera clear OROPHARYNX:no exudate, no erythema and lips. Plaques of thrush present on tongue and buccal mucosa.  NECK: supple, thyroid normal size, non-tender, without nodularity LYMPH:  no palpable lymphadenopathy in the cervical, axillary or inguinal LUNGS: clear to auscultation and percussion with normal breathing effort HEART: regular rate & rhythm and no murmurs and no lower extremity edema ABDOMEN:abdomen soft, non-tender and normal bowel sounds Musculoskeletal:no cyanosis of digits and no clubbing  NEURO: alert & oriented x 3 with fluent speech, no focal motor/sensory deficits  LABORATORY DATA:  I have reviewed the data as listed    Component Value Date/Time   NA 139 09/10/2022 1318   NA 134 09/13/2020 1416   NA 144 06/30/2014 1448   K 4.0 09/10/2022 1318   K 3.6 06/30/2014 1448   CL 110 09/10/2022 1318   CO2 25 09/10/2022 1318   CO2 25 06/30/2014 1448   GLUCOSE 101 (H) 09/10/2022 1318   GLUCOSE 86 06/30/2014 1448   BUN 9 09/10/2022 1318   BUN 8 09/13/2020 1416   BUN 19.0 06/30/2014 1448   CREATININE 0.94 09/10/2022 1318   CREATININE 1.19 (H) 03/12/2019 1354   CREATININE 1.0 06/30/2014 1448   CALCIUM 7.9 (L) 09/10/2022 1318   CALCIUM 8.2 (L) 06/30/2014 1448   PROT 5.5 (L) 09/10/2022 1318   PROT 7.0 06/30/2014 1448   ALBUMIN 3.2 (L) 09/10/2022 1318   ALBUMIN 3.4 (L) 06/30/2014 1448   AST 11 (L) 09/10/2022 1318   AST 16 06/30/2014 1448   ALT 10 09/10/2022 1318   ALT 7 06/30/2014 1448   ALKPHOS 71 09/10/2022 1318   ALKPHOS 84 06/30/2014 1448   BILITOT 0.3 09/10/2022 1318   BILITOT 0.24 06/30/2014 1448   GFRNONAA >60 09/10/2022 1318   GFRAA >60 07/07/2019 1404    Lab Results  Component Value  Date   WBC 6.5 09/10/2022   NEUTROABS 3.4 09/10/2022   HGB 13.1 09/10/2022   HCT 39.6 09/10/2022   MCV 91.0 09/10/2022   PLT 175 09/10/2022    Addendum I have seen the patient, examined her. I agree with the assessment and and plan and have edited the notes.   Ms Butterly  is here for maintenance atezolizumab.  She is clinically doing well, and has no noticeable side effect from treatment.  Lab reviewed, adequate for treatment, will proceed therapy today.  All questions were answered. I spent a total of 25 minutes for his visit today, more than 50% time on face-to-face counseling.  Malachy Mood MD 09/10/2022

## 2022-09-10 NOTE — Progress Notes (Signed)
After starting treatment- patient disclosed that she though she had a bladder infection. She also mentioned that she has more memory issues than previously. Patient had seen Vincent Gros, NP earlier- discussed with Herbert Seta. UA with culture ordered for potential bladder infection.

## 2022-09-11 ENCOUNTER — Encounter: Payer: 59 | Admitting: Dietician

## 2022-09-11 ENCOUNTER — Other Ambulatory Visit: Payer: 59

## 2022-09-11 ENCOUNTER — Ambulatory Visit: Payer: 59

## 2022-09-11 ENCOUNTER — Encounter: Payer: Self-pay | Admitting: Hematology and Oncology

## 2022-09-11 ENCOUNTER — Ambulatory Visit: Payer: 59 | Admitting: Physician Assistant

## 2022-09-11 ENCOUNTER — Encounter: Payer: Self-pay | Admitting: Physician Assistant

## 2022-09-11 ENCOUNTER — Other Ambulatory Visit: Payer: Self-pay | Admitting: Nurse Practitioner

## 2022-09-11 ENCOUNTER — Other Ambulatory Visit: Payer: Self-pay

## 2022-09-11 DIAGNOSIS — R300 Dysuria: Secondary | ICD-10-CM | POA: Insufficient documentation

## 2022-09-11 DIAGNOSIS — B370 Candidal stomatitis: Secondary | ICD-10-CM | POA: Insufficient documentation

## 2022-09-11 DIAGNOSIS — N390 Urinary tract infection, site not specified: Secondary | ICD-10-CM

## 2022-09-11 MED ORDER — NITROFURANTOIN MONOHYD MACRO 100 MG PO CAPS
100.0000 mg | ORAL_CAPSULE | Freq: Two times a day (BID) | ORAL | 0 refills | Status: DC
Start: 2022-09-11 — End: 2022-10-02

## 2022-09-11 NOTE — Assessment & Plan Note (Addendum)
Small Cell Lung Cancer, Extensive Stage 07/05/2020: CT abdomen for lower abdominal pain. New pulmonary nodular density noted 07/06/2020: CT chest showed 2.2 cm macrolobulated right lower lobe pulmonary nodule (favored) versus pathologically enlarged infrahilar lymph node 10/13/2020: PET CT scan performed, findings show 2 cm right lower lobe lung mass is hypermetabolic and consistent with primary lung neoplasm. Additionally found hypermetabolic 17 mm soft tissue lesion between the descending duodenum and the pancreatic head  10/19/2020: EGD to biopsy hypermetabolic lymph node. Biopsy results show small cell lung cancer 10/26/2020: establish care with Dr. Leonides Schanz  11/13/2020: Cycle 1 Day 1 of Carbo/Etop/Atezolizumab 12/04/2020: Cycle 2 Day 1 of Carbo/Etop/Atezolizumab 11/22-11/25/2022: admitted for E. Coli bacteremia/sepsis. Start of Cycle 3 delayed. 01/02/2021: Cycle 3 Day 1 of Carbo/Etop/Atezolizumab 01/23/2021: Cycle 4 Day 1 of Carbo/Etop/Atezolizumab 02/22/2021: Cycle 5 Day 1 of Atezolizumab Maintenance. Delayed start due to patient's COVID infection.  03/14/2021: Cycle 6 Day 1 of Atezolizumab Maintenance 04/05/2021: Cycle 7 Day 1 of Atezolizumab Maintenance 04/25/2021: Cycle 8 Day 1 of Atezolizumab Maintenance 05/16/2021: Cycle 9 Day 1 of Atezolizumab Maintenance 06/06/2021: Cycle 10 Day 1 of Atezolizumab Maintenance 06/27/2021:  Cycle 11 Day 1 of Atezolizumab Maintenance 07/18/2021: Cycle 12 Day 1 of Atezolizumab Maintenance 08/08/2021: Cycle 13 Day 1 of Atezolizumab Maintenance 08/29/2021: Cycle 14 Day 1 of Atezolizumab Maintenance 09/19/2021: Cycle 15 Day 1 of Atezolizumab Maintenance 10/10/2021: Cycle 16 Day 1 of Atezolizumab Maintenance 10/31/2021: Cycle 17 Day 1 of Atezolizumab Maintenance 11/21/2021: Cycle 18 Day 1 of Atezolizumab Maintenance 12/12/2021: Cycle 19 Day 1 of Atezolizumab Maintenance 01/02/2022: Cycle 20 Day 1 of Atezolizumab Maintenance 01/23/2022: Cycle 21 Day 1 of Atezolizumab  Maintenance 02/13/2022: treatment HELD due to diarrhea.  03/06/2022: Cycle 22 Day 1 of Atezolizumab Maintenance 03/27/2022: Cycle 23 Day 1 of Atezolizumab Maintenance 04/17/2022: Cycle 24 Day 1 of Atezolizumab Maintenance 05/08/2022:  Cycle 25 Day 1 of Atezolizumab Maintenance (Held due to patient preference for treatment holiday) 05/29/2022: Cycle 25 Day 1 of Atezolizumab Maintenance  06/19/2022: Cycle 26 Day 1 of Atezolizumab Maintenance  07/12/2022: Cycle 27 Day 1 of Atezolizumab Maintenance 07/31/2022: Cycle 28 Day 1 of Atezolizumab Maintenance HELD per patient request for fatigue.  08/22/2022: Cycle 28 Day 1 of Atezolizumab Maintenance 09/10/2022 - Cycle 29 Day 1 of Atezolizumab maintenance

## 2022-09-11 NOTE — Telephone Encounter (Signed)
Please see pt message and advise if anything further is needed

## 2022-09-11 NOTE — Progress Notes (Signed)
Called patient and relayed message below as per Vincent Gros NP. Patient voiced full understanding and thanked [place] for letting her know so quickly.   hi. will you let the patient know that it appears she does have a urinary tract infection. I have started macrobid 100 mg twice daily for the next 7 days. I sent this to [facility] drugs. thank you.   Vincent Gros NP

## 2022-09-11 NOTE — Assessment & Plan Note (Signed)
While in infusion suite, patient mentioned having pain when urinating and urinary frequency.  -collect urine sample today to evaluate for infection. Will treat as indicated based on results.

## 2022-09-11 NOTE — Progress Notes (Signed)
Started macrobid 100 mg twice daily for 7 days. Sent to [facility] drugs. Patient to be notified.

## 2022-09-11 NOTE — Progress Notes (Signed)
Nutrition Follow-up:  Patient with extensive stage small cell lung cancer. She is receiving maintenance therapy with atezolizumab q21d.   Met with pt in infusion. She reports appetite has improved since last visit and has gained wt. Pt reports occasional diarrhea, however this has improved. Pt reports recently having difficulties with time management. Provides example explaining that it takes her an hour to get ready for appointments and ~an hour for travel. She reports new challenge of knowing what time she needs to "get out of her chair" to begin the process. RN present for conversation. Pt did not discuss this with provider today. RN reports she will notify.    Medications: reviewed  Labs: albumin 3.2  Anthropometrics: Wt 189 lb 9.6 oz today increased   7/25 - 180 lb 6.4 oz  7/3 - 185 lb 9.6 oz    NUTRITION DIAGNOSIS: Inadequate oral intake improving    INTERVENTION:  Continue strategies for increasing calories and protein for wt maintenance Encouraged protein foods with every meal Suggested pt try setting alarm on phone to aid with new onset time management challenges    MONITORING, EVALUATION, GOAL: weight trends, intake   NEXT VISIT: Wednesday September 25 during infusion

## 2022-09-11 NOTE — Assessment & Plan Note (Signed)
Patient reporting soreness of tongue and inside cheeks.  Scratchy throat, hoarse voice.  States that she has gone several days without rinsing out her mouth after using inhalers. This has caused thrush infections in the past.  -diflucan 150 mg once. May repeat dose in 3 days for persistent symptoms.  -nystatin suspension. Advised her to swish and swallow 5 mls up to 4 times daily for next 7 to 10 days.

## 2022-09-11 NOTE — Telephone Encounter (Signed)
Please see pt response and advise 

## 2022-09-12 ENCOUNTER — Other Ambulatory Visit: Payer: 59

## 2022-09-12 ENCOUNTER — Encounter: Payer: Self-pay | Admitting: Gastroenterology

## 2022-09-12 ENCOUNTER — Ambulatory Visit (INDEPENDENT_AMBULATORY_CARE_PROVIDER_SITE_OTHER): Payer: 59 | Admitting: Gastroenterology

## 2022-09-12 VITALS — BP 122/80 | HR 93 | Ht 62.0 in | Wt 189.0 lb

## 2022-09-12 DIAGNOSIS — R194 Change in bowel habit: Secondary | ICD-10-CM

## 2022-09-12 DIAGNOSIS — R932 Abnormal findings on diagnostic imaging of liver and biliary tract: Secondary | ICD-10-CM | POA: Diagnosis not present

## 2022-09-12 DIAGNOSIS — D509 Iron deficiency anemia, unspecified: Secondary | ICD-10-CM | POA: Diagnosis not present

## 2022-09-12 MED ORDER — DICYCLOMINE HCL 10 MG PO CAPS: 10.0000 mg | ORAL_CAPSULE | Freq: Four times a day (QID) | ORAL | 3 refills | Status: DC | PRN

## 2022-09-12 NOTE — Progress Notes (Signed)
HPI :  67 year old female, here for a follow-up visit for possible cirrhosis of the liver, abnormal liver imaging, altered bowel habits.  This is my first time meeting her, she has previously been followed by Dr. Christella Hartigan.  She was most recently seen by Mike Gip this past May.  Recall she has stage IV small cell lung cancer diagnosed in 2022, currently on maintenance therapy with Atezolizumab per Oncology with stable disease.  As part of her staging imaging for her cancer, she was incidentally noted to have possible cirrhosis on prior CT scan of the abdomen as well as MRI back in April 2024.  She has had normal liver enzymes and platelet count over time.  No known history of liver disease.  She had imaging in 2023 which showed normal-appearing liver. She denies any significant alcohol use in the past.  No family history of liver disease.  She was seen by Mike Gip in May and had serologic workup.  Remarkable for mildly positive ANA but smooth muscle antibody negative.  AMA negative.  Negative for hepatitis B and C.  She does endorse a history of COPD but not oxygen dependent. An ultrasound with elastography was performed on May 21.  The gross image of the liver appears cirrhotic however he elastography was normal with a median K PA of 2.4, arguing against any significant fibrotic change. She has never had any decompensations from liver disease.  No jaundice, no ascites, no encephalopathy, no varices known.  We discussed how aggressive she wants to be with workup for this in regards to possible liver biopsy.  Otherwise she had iron studies checked to rule out hemochromatosis and found to be iron deficient.  She has had issues with this for years.  Had a workup with an EGD and colonoscopy in 2019 which did not reveal a clear cause, she had some gastritis at the time.  No cause on colonoscopy, few small polyps removed.  She had a follow-up EUS back in September 2022 showing involvement of her small  bowel from her lung cancer.  She takes oral iron pills.   She has chronic pain from hip replacement and orthopedic pain.  She takes 5 Norco's daily on average.  She also takes Mobic every day to allow her to function in regards to her pain.  She is not seeing any blood in her stool at all.  She previously was having some loose stools back in March was found to have a low pancreatic fecal elastase.  Her pancreas was normal on imaging.  She was placed on pancreatic enzyme supplementation and she states that stopped her diarrhea and in fact she got quite constipated and did not like that so she stopped the pancreatic enzyme supplementation.  She is currently having very soft stools that are hard to clean but no diarrhea.  This has been going on for some time.  She has not tried any fiber supplementation.  She continues to take iron supplementation.  She does have gas pains and bloating, uses dicyclomine for abdominal cramps as needed and she states those really help, asking for refill.  We discussed options.  Labs May 2024:  ANA (+) 1:80 Smooth muscle antibody negative AMA negative Hep B surface antigen negative Hep C antibody negative  INR 1.1  Ferritin 6.8, low iron sat Hgb 12.5, MCV 82.6  Lab Results  Component Value Date   ALT 10 09/10/2022   AST 11 (L) 09/10/2022   ALKPHOS 71 09/10/2022   BILITOT  0.3 09/10/2022    [place] with elastography 06/18/22: IMPRESSION: ULTRASOUND ABDOMEN:   Heterogeneous echotexture and subtle nodularity of the contours of the liver suggests early cirrhosis.   ULTRASOUND HEPATIC ELASTOGRAPHY:   Median kPa:  2.4   Diagnostic category:  < or = 5 kPa: high probability of being normal     CT CAP 07/26/22: IMPRESSION: 1. No CT evidence of recurrent or metastatic disease in the chest, abdomen, or pelvis. 2. Very large burden of stool throughout the colon and rectum. Correlate for constipation. 3. Descending and sigmoid diverticulosis without evidence of  acute diverticulitis. 4. Mildly coarse contour of the liver, suggestive of cirrhosis. Correlate with biochemical findings. 5. Emphysema. 6. Coronary artery disease.   Echo 07/24/22: EF 65-70%  Cardiac CT 07/10/22: INTERPRETATION:   CAD-RADS 1: Minimal non-obstructive CAD (1-24%). Consider non-atherosclerotic causes of chest pain. Consider preventive therapy and risk factor modification.  Past Medical History:  Diagnosis Date   Allergic rhinitis    Anemia    Anxiety    Chicken pox    Chronic back pain    COPD (chronic obstructive pulmonary disease) (HCC)    Depression    DM (diabetes mellitus) (HCC)    Essential hypertension    GERD (gastroesophageal reflux disease)    Headache    migraines   History of gastritis    EGD 2015   History of home oxygen therapy    2 liters at hs last 6 months   Hyperlipidemia    Hypothyroidism    Migraines    Osteoarthritis    oa   Scoliosis    Small cell lung cancer (HCC)    Stage 4     Past Surgical History:  Procedure Laterality Date   APPENDECTOMY     1985   BIOPSY  07/24/2017   Procedure: BIOPSY;  Surgeon: Rachael Fee, MD;  Location: [facility];  Service: Endoscopy;;   CARDIAC CATHETERIZATION N/A 10/31/2015   Procedure: Left Heart Cath and Coronary Angiography;  Surgeon: Marykay Lex, MD;  Location: [facility] INVASIVE CV LAB;  Service: Cardiovascular;  Laterality: N/A;   CARPAL TUNNEL RELEASE Left    CARPAL TUNNEL RELEASE Right    CHOLECYSTECTOMY  late 1980's   COLONOSCOPY WITH PROPOFOL N/A 07/24/2017   Procedure: COLONOSCOPY WITH PROPOFOL;  Surgeon: Rachael Fee, MD;  Location: [facility];  Service: Endoscopy;  Laterality: N/A;   ESOPHAGOGASTRODUODENOSCOPY N/A 07/24/2017   Procedure: ESOPHAGOGASTRODUODENOSCOPY (EGD);  Surgeon: Rachael Fee, MD;  Location: [facility];  Service: Endoscopy;  Laterality: N/A;   ESOPHAGOGASTRODUODENOSCOPY (EGD) WITH PROPOFOL N/A 10/19/2020   Procedure: ESOPHAGOGASTRODUODENOSCOPY  (EGD) WITH PROPOFOL;  Surgeon: Rachael Fee, MD;  Location: [facility];  Service: Endoscopy;  Laterality: N/A;   FINE NEEDLE ASPIRATION N/A 10/19/2020   Procedure: FINE NEEDLE ASPIRATION (FNA) LINEAR;  Surgeon: Rachael Fee, MD;  Location: [facility];  Service: Endoscopy;  Laterality: N/A;   GALLBLADDER SURGERY  1991   HIP CLOSED REDUCTION Right 01/08/2016   Procedure: CLOSED MANIPULATION HIP;  Surgeon: Jene Every, MD;  Location: [facility];  Service: Orthopedics;  Laterality: Right;   HIP CLOSED REDUCTION Right 01/19/2016   Procedure: ATTEMPTED CLOSED REDUCTION RIGHT HIP;  Surgeon: Toni Arthurs, MD;  Location: [facility];  Service: Orthopedics;  Laterality: Right;   HIP CLOSED REDUCTION Right 01/20/2016   Procedure: CLOSED REDUCTION RIGHT TOTAL HIP;  Surgeon: Durene Romans, MD;  Location: [facility];  Service: Orthopedics;  Laterality: Right;   HIP CLOSED REDUCTION Right 02/17/2016  Procedure: CLOSED REDUCTION RIGHT TOTAL HIP;  Surgeon: Samson Frederic, MD;  Location: [facility];  Service: Orthopedics;  Laterality: Right;   HIP CLOSED REDUCTION Right 02/28/2016   Procedure: CLOSED REDUCTION HIP;  Surgeon: Yolonda Kida, MD;  Location: [facility];  Service: Orthopedics;  Laterality: Right;   IR IMAGING GUIDED PORT INSERTION  11/01/2020   POLYPECTOMY  07/24/2017   Procedure: POLYPECTOMY;  Surgeon: Rachael Fee, MD;  Location: [facility];  Service: Endoscopy;;   TONSILLECTOMY     TOTAL ABDOMINAL HYSTERECTOMY     1985, with 1 ovary removed and 2 nd ovary removed 2003   TOTAL HIP ARTHROPLASTY Right    Original surgery 2006 with revision 2010   TOTAL HIP REVISION Right 01/01/2016   Procedure: TOTAL HIP REVISION;  Surgeon: Durene Romans, MD;  Location: [facility];  Service: Orthopedics;  Laterality: Right;   TOTAL HIP REVISION Right 03/02/2016   Procedure: TOTAL HIP REVISION;  Surgeon: Durene Romans, MD;  Location: [facility];  Service: Orthopedics;  Laterality: Right;   TOTAL HIP REVISION Right 09/02/2016    Procedure: Right hip constrained liner- posterior;  Surgeon: Durene Romans, MD;  Location: [facility];  Service: Orthopedics;  Laterality: Right;   ULNAR NERVE TRANSPOSITION Right    UPPER ESOPHAGEAL ENDOSCOPIC ULTRASOUND (EUS) N/A 10/19/2020   Procedure: UPPER ESOPHAGEAL ENDOSCOPIC ULTRASOUND (EUS);  Surgeon: Rachael Fee, MD;  Location: [facility];  Service: Endoscopy;  Laterality: N/A;  periduodenal lesion   Family History  Problem Relation Age of Onset   COPD Mother    Heart disease Mother    Lung disease Father        Asbestosis   Heart attack Father    Heart disease Father    Cerebral aneurysm Brother    Aneurysm Brother        Brain   Drug abuse Daughter    Epilepsy Son    Alcohol abuse Son    Drug abuse Son    Arthritis Maternal Grandmother    Heart disease Maternal Grandmother    Asthma Maternal Grandfather    Cancer Maternal Grandfather    Arthritis Paternal Grandmother    Heart disease Paternal Grandmother    Stroke Paternal Grandmother    Early death Paternal Grandfather    Heart disease Paternal Grandfather    Social History   Tobacco Use   Smoking status: Every Day    Current packs/day: 2.00    Average packs/day: 2.0 packs/day for 46.0 years (92.0 ttl pk-yrs)    Types: Cigarettes   Smokeless tobacco: Never   Tobacco comments:    2 packs of cigarettes smoked daily 12/13/21- declines smoking cessation  Vaping Use   Vaping status: Never Used  Substance Use Topics   Alcohol use: No   Drug use: No   Current Outpatient Medications  Medication Sig Dispense Refill   albuterol (PROAIR HFA) 108 (90 Base) MCG/ACT inhaler 2 puffs every 4 hours as needed only  if your can't catch your breath 18 g 5   ALPRAZolam (XANAX) 1 MG tablet Take 1 tablet (1 mg total) by mouth 3 (three) times daily as needed. for anxiety 90 tablet 2   atorvastatin (LIPITOR) 20 MG tablet TAKE 1 TABLET BY MOUTH AT BEDTIME 90 tablet 1   benzonatate (TESSALON) 100 MG capsule Take 1 capsule (100  mg total) by mouth 3 (three) times daily. 90 capsule 1   calcipotriene (DOVONOX) 0.005 % cream Apply to affected area bid when on break from steroid  cream 120 g 3   cyclobenzaprine (FLEXERIL) 10 MG tablet Take 10 mg by mouth 3 (three) times daily as needed.     dicyclomine (BENTYL) 10 MG capsule TAKE ONE CAPSULE BY MOUTH TWICE DAILY 60 capsule 1   diphenoxylate-atropine (LOMOTIL) 2.5-0.025 MG tablet Take 1 tablet by mouth 4 (four) times daily as needed for diarrhea or loose stools. 60 tablet 0   DULoxetine (CYMBALTA) 60 MG capsule TAKE TWO CAPSULES BY MOUTH EVERY DAY 180 capsule 1   FEROSUL 325 (65 Fe) MG tablet Take 325 mg by mouth daily with breakfast.     fluconazole (DIFLUCAN) 150 MG tablet Take 1 tablet po once. May repeat dose in 3 days prn persistent symptoms. 3 tablet 0   fluticasone (FLONASE) 50 MCG/ACT nasal spray INSTILL 2 SPRAYS IN EACH NOSTRIL EVERY DAY 16 g 6   Fluticasone-Umeclidin-Vilant (TRELEGY ELLIPTA) 200-62.5-25 MCG/ACT AEPB Inhale 1 puff into the lungs daily at 2 PM. 60 each 5   furosemide (LASIX) 20 MG tablet TAKE 1 TABLET BY MOUTH TWICE DAILY, IF SWELLING IMPROVES CAN HOLD MEDICATION 90 tablet 0   HYDROcodone-acetaminophen (NORCO) 10-325 MG tablet Take 1 tablet by mouth every 4 (four) hours as needed. 150 tablet 0   hydrOXYzine (ATARAX) 10 MG tablet Take 1 tablet (10 mg total) by mouth 3 (three) times daily as needed for itching. 30 tablet 0   levothyroxine (SYNTHROID) 137 MCG tablet Take 1 tablet (137 mcg total) by mouth daily before breakfast. 90 tablet 1   loratadine (CLARITIN) 10 MG tablet Take 10 mg by mouth daily.     meloxicam (MOBIC) 15 MG tablet TAKE 1 TABLET BY MOUTH EVERY DAY 30 tablet 2   nitrofurantoin, macrocrystal-monohydrate, (MACROBID) 100 MG capsule Take 1 capsule (100 mg total) by mouth 2 (two) times daily. 14 capsule 0   nystatin (MYCOSTATIN) 100000 UNIT/ML suspension Swish and swallow with 5 ml up to QID prn for thrush 400 mL 0   OLANZapine (ZYPREXA) 10  MG tablet Take 1 tablet (10 mg total) by mouth at bedtime. 30 tablet 2   omeprazole (PRILOSEC) 40 MG capsule TAKE ONE CAPSULE BY MOUTH TWICE DAILY 180 capsule 3   ondansetron (ZOFRAN) 8 MG tablet Take 1 tablet (8 mg total) by mouth 2 (two) times daily. 60 tablet 1   permethrin (ELIMITE) 5 % cream      potassium chloride SA (KLOR-CON M) 20 MEQ tablet TAKE 1 TABLET BY MOUTH TWICE DAILY 60 tablet 2   prochlorperazine (COMPAZINE) 10 MG tablet Take 1 tablet (10 mg total) by mouth every 6 (six) hours as needed for nausea or vomiting. 30 tablet 0   Rimegepant Sulfate (NURTEC) 75 MG TBDP Take 1 tablet (75 mg total) by mouth daily as needed (for migraine-). Take within 15 minutes of initial symptoms 8 tablet 5   topiramate (TOPAMAX) 25 MG tablet Take 1 tablet (25 mg total) by mouth at bedtime. X 1 week, then 50 mg QHS x 1 week, then 75 mg QHS x 1 week, then 100 mg QHS- stop increasing if migraines dramatically improve- for migraine prevention 120 tablet 5   triamcinolone ointment (KENALOG) 0.5 % Apply 1 Application topically 3 (three) times daily. Apply thin-layer on affected areas, no more than 2 weeks consistently. (Patient not taking: Reported on 09/12/2022) 60 g 2   triamterene-hydrochlorothiazide (MAXZIDE-25) 37.5-25 MG tablet TAKE 1 TABLET BY MOUTH DAILY (Patient not taking: Reported on 09/12/2022) 90 tablet 2   No current facility-administered medications for this visit.  Allergies  Allergen Reactions   Metformin And Related Diarrhea   Nsaids Diarrhea   Wellbutrin [Bupropion] Other (See Comments)    Makes her too sleepy    Aleve [Naproxen Sodium] Other (See Comments)    Headache    Codeine Nausea Only and Other (See Comments)    GI upset   Penicillins Nausea Only and Other (See Comments)    GI upset    Sulfonamide Derivatives Hives     Review of Systems: All systems reviewed and negative except where noted in HPI.   Labs per HPI  Physical Exam: BP 122/80   Pulse 93   Ht 5\' 2"   (1.575 m)   Wt 189 lb (85.7 kg)   BMI 34.57 kg/m  Constitutional: Pleasant,well-developed, female in no acute distress. Neurological: Alert and oriented to person place and time. Psychiatric: Normal mood and affect. Behavior is normal.   ASSESSMENT: 67 y.o. female here for assessment of the following  1. Abnormal liver diagnostic imaging   2. Iron deficiency anemia, unspecified iron deficiency anemia type   3. Altered bowel habits    As above, stage IV lung cancer, noted to have cirrhotic appearing liver on her surveillance imaging.  Liver enzymes are normal, platelets are normal.  Serologic workup for liver disease negative to date.  Ultrasound with elastography suggests overtly cirrhotic appearing liver however the elastography is normal, so some conflicting results here.  We discussed the only way to truly tell if she has cirrhosis at this point is with a liver biopsy.  We discussed what that would entail, risks of the procedures and benefits.  I do not think that would change her management at this point.  If she has cirrhosis it is well compensated at this time and hopefully never bothers her.  She understands that if she does have cirrhosis the risk for decompensation and HCC moving forward.  My suspicion is that if she does have cirrhosis it would probably be due to fatty liver.  She does have COPD, I will check an alpha-1 antitrypsin level to make sure negative.  Will also screen her for [facility] with an AFP level.  Again I doubt a liver biopsy will make a big difference here and probably not worth the risks at this time.  She will think about this further but for now we will hold off on liver biopsy.  She will see me every 6 months for this issue.  We otherwise discussed her iron deficiency, longstanding, she has known small bowel involvement from her lung cancer.  Unclear if it is related to that, versus NSAID use.  She is taking omeprazole twice daily and should continue that.  We discussed  how aggressively she want to be with this.  Given her comorbidities with metastatic lung cancer and no other new concerning findings on her imaging, would hold off on putting her through invasive procedures at this time.  She is agreeable with that. She reports she will have imaging of her malignancy every 3 months moving forward, so this will perform her HCC screening.  She has no imaging findings concerning for portal hypertension or varices otherwise, will hold off on EGD for now and keep an eye on her imaging and platelets.  We otherwise discussed her bowel habit changes, history of low fecal pancreatic elastase.  Possible she has some pancreatic insufficiency as she did respond to pancreatic enzyme supplementation, became constipated in the setting of narcotic use.  She could resume pancreatic enzymes if this  helps, but take 1 pill a day as opposed to and see if this prevents constipation while doing this.  Alternatively she could try daily fiber supplement such as Citrucel once daily.  Up to her and how much this bothers her, her pancreas again appears normal on imaging.   PLAN: - lab today - alpha one antitrypsin, and AFP level - other labs UTD - continue imaging with oncology for her malignancy surveillance - this will also servce for her HCC screening - discussed role of liver biopsy - likely will not change management and hold off for now - IDA workup has been done, metastatic lung CA, no further plans for endoscopies right now given comorbidities. No evidence of portal HTN on CT imaging in regards to possible varices, platelets also normal.  - resume pancreatic enzymes dosed at once per day - will see if it helps and does not constipate her. Bowel habits are complicated by narcotic and iron use - add try adding citrucel capsules - daily if needed - refill dicyclomine to use PRN - f/u 6 months or sooner with issues  Harlin Rain, MD [facility]

## 2022-09-12 NOTE — Patient Instructions (Addendum)
Please go to the lab in the basement of our building to have lab work done as you leave today. Hit "B" for basement when you get on the elevator.  When the doors open the lab is on your left.  We will call you with the results. Thank you.  Continue to follow with Oncology and have imaging.  Resume pancreatic enzymes. Add Citrucel capsules daily as needed.  We have sent the following medications to your pharmacy for you to pick up at your convenience: Bentyl (dicyclomine): Take every 6 hours as needed  Please follow up in 6 months.  Thank you for entrusting me with your care and for choosing [facility], Dr. Ileene Patrick  If your blood pressure at your visit was 140/90 or greater, please contact your primary care physician to follow up on this. ______________________________________________________  If you are age 66 or older, your body mass index should be between 23-30. Your Body mass index is 34.57 kg/m. If this is out of the aforementioned range listed, please consider follow up with your Primary Care Provider.  If you are age 28 or younger, your body mass index should be between 19-25. Your Body mass index is 34.57 kg/m. If this is out of the aformentioned range listed, please consider follow up with your Primary Care Provider.  ________________________________________________________  The  GI providers would like to encourage you to use [facility] to communicate with providers for non-urgent requests or questions.  Due to long hold times on the telephone, sending your provider a message by [facility] may be a faster and more efficient way to get a response.  Please allow 48 business hours for a response.  Please remember that this is for non-urgent requests.  _______________________________________________________  Due to recent changes in healthcare laws, you may see the results of your imaging and laboratory studies on MyChart before your provider has had a chance to  review them.  We understand that in some cases there may be results that are confusing or concerning to you. Not all laboratory results come back in the same time frame and the provider may be waiting for multiple results in order to interpret others.  Please give [place] 48 hours in order for your provider to thoroughly review all the results before contacting the office for clarification of your results.

## 2022-09-13 ENCOUNTER — Encounter (HOSPITAL_BASED_OUTPATIENT_CLINIC_OR_DEPARTMENT_OTHER): Payer: Self-pay | Admitting: Pulmonary Disease

## 2022-09-13 ENCOUNTER — Ambulatory Visit (HOSPITAL_BASED_OUTPATIENT_CLINIC_OR_DEPARTMENT_OTHER): Payer: 59 | Admitting: Pulmonary Disease

## 2022-09-13 ENCOUNTER — Encounter: Payer: Self-pay | Admitting: Nurse Practitioner

## 2022-09-13 ENCOUNTER — Encounter: Payer: Self-pay | Admitting: Hematology and Oncology

## 2022-09-13 VITALS — BP 110/70 | HR 100 | Ht 62.0 in | Wt 188.0 lb

## 2022-09-13 DIAGNOSIS — T17908A Unspecified foreign body in respiratory tract, part unspecified causing other injury, initial encounter: Secondary | ICD-10-CM

## 2022-09-13 NOTE — Patient Instructions (Addendum)
COPD  --CONTINUE Trelegy ONE puff ONCE a day --CONTINUE Albuterol AS NEEDED shortness of breath or wheezing.   Concern for aspiration Cough/SOB with meals --ORDER swallow evaluation

## 2022-09-13 NOTE — Progress Notes (Signed)
@Patient  ID: [name], female    DOB: 1955/05/22, 67 y.o.   MRN: [id number]  Chief Complaint  Patient presents with   COPD    Patient feels like she is getting worse, she has had to use more albuterol than before    [name] is a 67 year old female active smoker with COPD and chronic respiratory failure, scoliosis and DDD who presents for follow-up.  Since our last visit she was started on Trelegy. She reports shortness of breath is occurring more frequently and seems to occur in morning and late evenings. Wheezing and coughing with yellow-gray sputum are stable. Reports upper chest congestion. She has been checking her oxygen 84-88% in the morning. She reports confusion in the mornings.  At baseline, she has shortness of breath with talking and short distances. She is able to climb stairs one step at a time and goes slow pace. Her activity is limited due to back and bilateral hip pain s/p multiple hip surgeries. Endorses wheezing daily. Has chronic cough with some yellow-gray sputum production. She is not compliant with her home oxygen for > 1 year due to difficulty tolerate the tubing. She has tried Spiriva which she tolerated however insurance did not pay for it. Nebulizers were tolerable however the setup and time spent on the machine was cumbersome.  11/10/20 Since our last visit, she was diagnosed with small cell carcinoma of lung confirmed via EUS. She is scheduled for treatment starting 11/13/20.   She reports she is coughing with chest congestion. She has decreased her cigarettes from 2.5 ppd cigarettes to 1.5 ppd. Denies wheezing. She is compliant with her Trelegy. Symptoms worsen at night. She uses her rescue inhaler 2-3 times a week.  04/10/21 Since our last visit she has completed chemotherapy and now on IT. Has some nausea. She is compliant with her Trelegy but has run out due to cost (>$100). She has shortness of breath and cough off the inhaler. She is compliant  with nocturnal oxygen and currently happy with her current insurance company. She continues to smoke 2 ppd due to home stressors.  03/08/22 Since our last visit she has been on Atezolizumab maintenance for extensive stage small cell lung cancer. This is currently held due to diarrhea. Since our last visit she is on [place]. Uses albuterol three times a month. Reports congestion in chest, shortness of breath and daily wheeze. Does not perform regular activity. She reports active smoking 2 ppd. Not ready to quit. She is not compliant with her oxygen at night. She feels it is dangerous to use because the tubing catches on her neck. Has to use her walker and worried this will interfere it.  09/13/22 Since our last visit she was prescribed Breztri but changed to Trelegy 200 on 06/18/22. Using albuterol three times a week. She no longer wears oxygen at night and felt like she was choking on it. She reports that she gets choked while swallowing for 6- 8 months. She reports feeling short of breath with frequent coughing during meals especially. She has even slowed down her eating more than usual.   Social History: Active smoker Daughter passed from OD two years ago Currently has grandson living with her, stressor  Past Medical History:  Diagnosis Date   Allergic rhinitis    Anemia    Anxiety    Chicken pox    Chronic back pain    COPD (chronic obstructive pulmonary disease) (HCC)    Depression  DM (diabetes mellitus) (HCC)    Essential hypertension    GERD (gastroesophageal reflux disease)    Headache    migraines   History of gastritis    EGD 2015   History of home oxygen therapy    2 liters at hs last 6 months   Hyperlipidemia    Hypothyroidism    Migraines    Osteoarthritis    oa   Scoliosis    Small cell lung cancer (HCC)    Stage 4    Outpatient Medications Prior to Visit  Medication Sig Dispense Refill   albuterol (PROAIR HFA) 108 (90 Base) MCG/ACT inhaler 2 puffs every 4 hours  as needed only  if your can't catch your breath 18 g 5   ALPRAZolam (XANAX) 1 MG tablet Take 1 tablet (1 mg total) by mouth 3 (three) times daily as needed. for anxiety 90 tablet 2   atorvastatin (LIPITOR) 20 MG tablet TAKE 1 TABLET BY MOUTH AT BEDTIME 90 tablet 1   benzonatate (TESSALON) 100 MG capsule Take 1 capsule (100 mg total) by mouth 3 (three) times daily. 90 capsule 1   calcipotriene (DOVONOX) 0.005 % cream Apply to affected area bid when on break from steroid cream 120 g 3   cyclobenzaprine (FLEXERIL) 10 MG tablet Take 10 mg by mouth 3 (three) times daily as needed.     dicyclomine (BENTYL) 10 MG capsule Take 1 capsule (10 mg total) by mouth every 6 (six) hours as needed for spasms. 90 capsule 3   diphenoxylate-atropine (LOMOTIL) 2.5-0.025 MG tablet Take 1 tablet by mouth 4 (four) times daily as needed for diarrhea or loose stools. 60 tablet 0   DULoxetine (CYMBALTA) 60 MG capsule TAKE TWO CAPSULES BY MOUTH EVERY DAY 180 capsule 1   FEROSUL 325 (65 Fe) MG tablet Take 325 mg by mouth daily with breakfast.     fluconazole (DIFLUCAN) 150 MG tablet Take 1 tablet po once. May repeat dose in 3 days prn persistent symptoms. 3 tablet 0   fluticasone (FLONASE) 50 MCG/ACT nasal spray INSTILL 2 SPRAYS IN EACH NOSTRIL EVERY DAY 16 g 6   Fluticasone-Umeclidin-Vilant (TRELEGY ELLIPTA) 200-62.5-25 MCG/ACT AEPB Inhale 1 puff into the lungs daily at 2 PM. 60 each 5   furosemide (LASIX) 20 MG tablet TAKE 1 TABLET BY MOUTH TWICE DAILY, IF SWELLING IMPROVES CAN HOLD MEDICATION 90 tablet 0   HYDROcodone-acetaminophen (NORCO) 10-325 MG tablet Take 1 tablet by mouth every 4 (four) hours as needed. 150 tablet 0   hydrOXYzine (ATARAX) 10 MG tablet Take 1 tablet (10 mg total) by mouth 3 (three) times daily as needed for itching. 30 tablet 0   levothyroxine (SYNTHROID) 137 MCG tablet Take 1 tablet (137 mcg total) by mouth daily before breakfast. 90 tablet 1   loratadine (CLARITIN) 10 MG tablet Take 10 mg by mouth  daily.     meloxicam (MOBIC) 15 MG tablet TAKE 1 TABLET BY MOUTH EVERY DAY 30 tablet 2   nitrofurantoin, macrocrystal-monohydrate, (MACROBID) 100 MG capsule Take 1 capsule (100 mg total) by mouth 2 (two) times daily. 14 capsule 0   nystatin (MYCOSTATIN) 100000 UNIT/ML suspension Swish and swallow with 5 ml up to QID prn for thrush 400 mL 0   OLANZapine (ZYPREXA) 10 MG tablet Take 1 tablet (10 mg total) by mouth at bedtime. 30 tablet 2   omeprazole (PRILOSEC) 40 MG capsule TAKE ONE CAPSULE BY MOUTH TWICE DAILY 180 capsule 3   ondansetron (ZOFRAN) 8 MG tablet Take 1  tablet (8 mg total) by mouth 2 (two) times daily. 60 tablet 1   Pancrelipase, Lip-Prot-Amyl, (CREON PO) Take by mouth.     permethrin (ELIMITE) 5 % cream      potassium chloride SA (KLOR-CON M) 20 MEQ tablet TAKE 1 TABLET BY MOUTH TWICE DAILY 60 tablet 2   prochlorperazine (COMPAZINE) 10 MG tablet Take 1 tablet (10 mg total) by mouth every 6 (six) hours as needed for nausea or vomiting. 30 tablet 0   Rimegepant Sulfate (NURTEC) 75 MG TBDP Take 1 tablet (75 mg total) by mouth daily as needed (for migraine-). Take within 15 minutes of initial symptoms 8 tablet 5   triamcinolone ointment (KENALOG) 0.5 % Apply 1 Application topically 3 (three) times daily. Apply thin-layer on affected areas, no more than 2 weeks consistently. 60 g 2   triamterene-hydrochlorothiazide (MAXZIDE-25) 37.5-25 MG tablet TAKE 1 TABLET BY MOUTH DAILY 90 tablet 2   topiramate (TOPAMAX) 25 MG tablet Take 1 tablet (25 mg total) by mouth at bedtime. X 1 week, then 50 mg QHS x 1 week, then 75 mg QHS x 1 week, then 100 mg QHS- stop increasing if migraines dramatically improve- for migraine prevention 120 tablet 5   No facility-administered medications prior to visit.   Review of Systems  Constitutional:  Negative for chills, diaphoresis, fever, malaise/fatigue and weight loss.  HENT:  Negative for congestion.   Respiratory:  Positive for cough and shortness of breath.  Negative for hemoptysis, sputum production and wheezing.   Cardiovascular:  Negative for chest pain, palpitations and leg swelling.    BP 110/70   Pulse 100   Ht 5\' 2"  (1.575 m)   Wt 188 lb (85.3 kg)   SpO2 98%   BMI 34.39 kg/m   Physical Exam: General: Well-appearing, no acute distress HENT: [place], AT Eyes: EOMI, no scleral icterus Respiratory: Clear to auscultation bilaterally.  No crackles, wheezing or rales Cardiovascular: RRR, -M/R/G, no JVD Extremities:-Edema,-tenderness Neuro: AAO x4, CNII-XII grossly intact Psych: Normal mood, normal affect  Data Reviewed:  PFTs: 05/26/17 FVC 2.38 (72%) FEV1 1.6 (63%) Ratio 63 TLC 105% RV 141% DLCO 55% Interpretation: Moderate obstructive defect with significant bronchodilator response in FEV1. With presence and airtrapping and reduced gas exchange, findings are consistent with emphysema.  Labs: - Allergy profile 03/18/17 >  Eos 0.3 /  IgE  4 neg RAST  Alpha-1 December 05, 2017 normal, MM, 174  Chest imaging: HRCT chest 12/24/2017-no ILD, mild to moderate emphysema CT A/P 07/05/20- right infrahilar enlargement vs overlaying right lower lobe lung nodule. Moderate stool in colon. CT Angio Chest Aort 07/26/20 Macrolobulated RLL nodule vs enlarged right infrahilar lymph node with punctate satellite nodule, paraseptal emphysema CT Coronary 09/21/20 - Macrolobulated RLLL 2.1 x 1.5 cm lung nodule with adjacent 4mm nodule PET 10/13/20 - Hypermetabolic 2cm RLL lesion of the lung and 17mm soft tissue between descending duodenum and pancreatic head, emphysema MR Brain 11/04/20 - No metastatic disease CT CAP 01/17/21 - Resolution of RLL mass. Mild residual soft tissue of pancreaticoduodenol groove. Close follow-up recommended CT CAP 01/22/22 - No evidence of small cell lung cancer recurrence CT CAP 07/26/22 - No evidence of small cell lung cancer recurrence, sigmoid diverticulosis, mildly coarse contour of liver suggestive of cirrhosis,  emphysema  Assessment & Plan:  67 year old female active smoker with COPD/emphysema, nocturnal hypoxemia and extensive small cell carcinoma with no recurrence since 03/2021 who presents for follow-up for COPD. We are on maximal inhaler therapy but will  evaluate for triggers including aspiration that can affect this. Counseled on tobacco abuse  COPD  --CONTINUE Trelegy ONE puff ONCE a day --CONTINUE Albuterol AS NEEDED shortness of breath or wheezing.   History nocturnal hypoxemia  --Previously on 3L nightly but self-discontinued due to choking from tubing --Counseled on risks of untreated nocturnal hypoxemia --Declined overnight oximetry testing  Concern for aspiration Cough/SOB with meals --ORDER swallow evaluation  Tobacco abuse Patient is an active smoker. Not ready to quit  Small cell carcinoma of the lung, stage IVa --Per 03/2021 Oncology note, excellent response to therapy --On maintenance therapy. Held 01/2022 due to diarrhea --CT CAP 12/2021 reviewed with no recurrence  Health Maintenance Immunization History  Administered Date(s) Administered   Influenza,inj,Quad PF,6+ Mos 10/02/2018, 11/30/2019, 11/15/2020   Influenza-Unspecified 01/16/2011, 11/26/2011, 12/22/2012, 11/17/2018   PFIZER Comirnaty(Gray Top)Covid-19 Tri-Sucrose Vaccine 05/17/2020   PFIZER(Purple Top)SARS-COV-2 Vaccination 10/20/2019, 11/10/2019   PNEUMOCOCCAL CONJUGATE-20 09/04/2020   Td 06/07/2005   CT Lung Screen - not qualified  Orders Placed This Encounter  Procedures   SLP modified barium swallow    Standing Status:   Future    Standing Expiration Date:   09/13/2023    Order Specific Question:   Where should this test be performed:    Answer:   [facility]    Order Specific Question:   Please indicate reason for Referral:    Answer:   Concerned about Dysphagia/Aspiration    Order Specific Question:   Patients current diet consistency:    Answer:   Regular    Order Specific Question:   Existing  signs/symptoms of possible Aspiration/Dysphagia:    Answer:   Cough with Meals/Meds/Other P.O.s   No orders of the defined types were placed in this encounter.  Return in about 3 months (around 12/14/2022).  I have spent a total time of 32-minutes on the day of the appointment including chart review, data review, collecting history, coordinating care and discussing medical diagnosis and plan with the patient/family. Past medical history, allergies, medications were reviewed. Pertinent imaging, labs and tests included in this note have been reviewed and interpreted independently by me.  Beverely Suen Mechele Collin, MD 09/13/2022

## 2022-09-14 ENCOUNTER — Other Ambulatory Visit: Payer: Self-pay | Admitting: Physician Assistant

## 2022-09-14 DIAGNOSIS — R600 Localized edema: Secondary | ICD-10-CM

## 2022-09-16 ENCOUNTER — Other Ambulatory Visit (HOSPITAL_COMMUNITY): Payer: Self-pay | Admitting: *Deleted

## 2022-09-16 ENCOUNTER — Encounter: Payer: Self-pay | Admitting: Physician Assistant

## 2022-09-16 DIAGNOSIS — R1310 Dysphagia, unspecified: Secondary | ICD-10-CM

## 2022-09-16 LAB — ALPHA-1-ANTITRYPSIN: A-1 Antitrypsin, Ser: 165 mg/dL (ref 83–199)

## 2022-09-16 LAB — AFP TUMOR MARKER: AFP-Tumor Marker: 3.6 ng/mL

## 2022-09-16 NOTE — Telephone Encounter (Signed)
Please review message

## 2022-09-17 NOTE — Telephone Encounter (Signed)
Pt was already told to schedule an appt, but did not want to schedule at present.

## 2022-09-25 DIAGNOSIS — N6341 Unspecified lump in right breast, subareolar: Secondary | ICD-10-CM | POA: Diagnosis not present

## 2022-09-25 DIAGNOSIS — R92313 Mammographic fatty tissue density, bilateral breasts: Secondary | ICD-10-CM | POA: Diagnosis not present

## 2022-09-25 DIAGNOSIS — N6311 Unspecified lump in the right breast, upper outer quadrant: Secondary | ICD-10-CM | POA: Diagnosis not present

## 2022-09-27 ENCOUNTER — Inpatient Hospital Stay (HOSPITAL_COMMUNITY): Admission: RE | Admit: 2022-09-27 | Payer: 59 | Source: Ambulatory Visit

## 2022-09-27 ENCOUNTER — Encounter (HOSPITAL_COMMUNITY): Payer: 59

## 2022-09-27 LAB — HM MAMMOGRAPHY

## 2022-10-01 ENCOUNTER — Other Ambulatory Visit: Payer: Self-pay | Admitting: Physician Assistant

## 2022-10-02 ENCOUNTER — Inpatient Hospital Stay (HOSPITAL_BASED_OUTPATIENT_CLINIC_OR_DEPARTMENT_OTHER): Payer: 59 | Admitting: Hematology and Oncology

## 2022-10-02 ENCOUNTER — Inpatient Hospital Stay: Payer: 59

## 2022-10-02 ENCOUNTER — Inpatient Hospital Stay: Payer: 59 | Attending: Hematology and Oncology

## 2022-10-02 VITALS — BP 136/88 | HR 93 | Temp 98.1°F | Resp 16 | Wt 179.9 lb

## 2022-10-02 DIAGNOSIS — G8929 Other chronic pain: Secondary | ICD-10-CM | POA: Insufficient documentation

## 2022-10-02 DIAGNOSIS — Z79899 Other long term (current) drug therapy: Secondary | ICD-10-CM | POA: Insufficient documentation

## 2022-10-02 DIAGNOSIS — Z811 Family history of alcohol abuse and dependence: Secondary | ICD-10-CM | POA: Insufficient documentation

## 2022-10-02 DIAGNOSIS — F17210 Nicotine dependence, cigarettes, uncomplicated: Secondary | ICD-10-CM | POA: Diagnosis not present

## 2022-10-02 DIAGNOSIS — Z823 Family history of stroke: Secondary | ICD-10-CM | POA: Insufficient documentation

## 2022-10-02 DIAGNOSIS — K219 Gastro-esophageal reflux disease without esophagitis: Secondary | ICD-10-CM | POA: Diagnosis not present

## 2022-10-02 DIAGNOSIS — C3431 Malignant neoplasm of lower lobe, right bronchus or lung: Secondary | ICD-10-CM | POA: Diagnosis not present

## 2022-10-02 DIAGNOSIS — Z885 Allergy status to narcotic agent status: Secondary | ICD-10-CM | POA: Diagnosis not present

## 2022-10-02 DIAGNOSIS — Z8249 Family history of ischemic heart disease and other diseases of the circulatory system: Secondary | ICD-10-CM | POA: Insufficient documentation

## 2022-10-02 DIAGNOSIS — Z886 Allergy status to analgesic agent status: Secondary | ICD-10-CM | POA: Diagnosis not present

## 2022-10-02 DIAGNOSIS — Z814 Family history of other substance abuse and dependence: Secondary | ICD-10-CM | POA: Insufficient documentation

## 2022-10-02 DIAGNOSIS — Z5112 Encounter for antineoplastic immunotherapy: Secondary | ICD-10-CM | POA: Diagnosis not present

## 2022-10-02 DIAGNOSIS — F32A Depression, unspecified: Secondary | ICD-10-CM | POA: Diagnosis not present

## 2022-10-02 DIAGNOSIS — Z809 Family history of malignant neoplasm, unspecified: Secondary | ICD-10-CM | POA: Insufficient documentation

## 2022-10-02 DIAGNOSIS — E785 Hyperlipidemia, unspecified: Secondary | ICD-10-CM | POA: Insufficient documentation

## 2022-10-02 DIAGNOSIS — E876 Hypokalemia: Secondary | ICD-10-CM | POA: Diagnosis not present

## 2022-10-02 DIAGNOSIS — Z79890 Hormone replacement therapy (postmenopausal): Secondary | ICD-10-CM | POA: Diagnosis not present

## 2022-10-02 DIAGNOSIS — Z8719 Personal history of other diseases of the digestive system: Secondary | ICD-10-CM | POA: Insufficient documentation

## 2022-10-02 DIAGNOSIS — J449 Chronic obstructive pulmonary disease, unspecified: Secondary | ICD-10-CM | POA: Insufficient documentation

## 2022-10-02 DIAGNOSIS — Z79620 Long term (current) use of immunosuppressive biologic: Secondary | ICD-10-CM | POA: Diagnosis not present

## 2022-10-02 DIAGNOSIS — C3490 Malignant neoplasm of unspecified part of unspecified bronchus or lung: Secondary | ICD-10-CM

## 2022-10-02 DIAGNOSIS — R600 Localized edema: Secondary | ICD-10-CM | POA: Insufficient documentation

## 2022-10-02 DIAGNOSIS — Z95828 Presence of other vascular implants and grafts: Secondary | ICD-10-CM

## 2022-10-02 DIAGNOSIS — Z880 Allergy status to penicillin: Secondary | ICD-10-CM | POA: Insufficient documentation

## 2022-10-02 DIAGNOSIS — G629 Polyneuropathy, unspecified: Secondary | ICD-10-CM | POA: Insufficient documentation

## 2022-10-02 DIAGNOSIS — F419 Anxiety disorder, unspecified: Secondary | ICD-10-CM | POA: Diagnosis not present

## 2022-10-02 DIAGNOSIS — I10 Essential (primary) hypertension: Secondary | ICD-10-CM | POA: Diagnosis not present

## 2022-10-02 DIAGNOSIS — R5383 Other fatigue: Secondary | ICD-10-CM | POA: Insufficient documentation

## 2022-10-02 DIAGNOSIS — Z888 Allergy status to other drugs, medicaments and biological substances status: Secondary | ICD-10-CM | POA: Insufficient documentation

## 2022-10-02 DIAGNOSIS — Z9049 Acquired absence of other specified parts of digestive tract: Secondary | ICD-10-CM | POA: Insufficient documentation

## 2022-10-02 DIAGNOSIS — Z820 Family history of epilepsy and other diseases of the nervous system: Secondary | ICD-10-CM | POA: Insufficient documentation

## 2022-10-02 DIAGNOSIS — R197 Diarrhea, unspecified: Secondary | ICD-10-CM | POA: Diagnosis not present

## 2022-10-02 DIAGNOSIS — E119 Type 2 diabetes mellitus without complications: Secondary | ICD-10-CM | POA: Insufficient documentation

## 2022-10-02 DIAGNOSIS — Z8261 Family history of arthritis: Secondary | ICD-10-CM | POA: Insufficient documentation

## 2022-10-02 DIAGNOSIS — Z882 Allergy status to sulfonamides status: Secondary | ICD-10-CM | POA: Insufficient documentation

## 2022-10-02 DIAGNOSIS — Z90721 Acquired absence of ovaries, unilateral: Secondary | ICD-10-CM | POA: Insufficient documentation

## 2022-10-02 DIAGNOSIS — Z825 Family history of asthma and other chronic lower respiratory diseases: Secondary | ICD-10-CM | POA: Insufficient documentation

## 2022-10-02 DIAGNOSIS — Z90710 Acquired absence of both cervix and uterus: Secondary | ICD-10-CM | POA: Insufficient documentation

## 2022-10-02 LAB — CMP (CANCER CENTER ONLY)
ALT: 10 U/L (ref 0–44)
AST: 15 U/L (ref 15–41)
Albumin: 3.8 g/dL (ref 3.5–5.0)
Alkaline Phosphatase: 72 U/L (ref 38–126)
Anion gap: 10 (ref 5–15)
BUN: 15 mg/dL (ref 8–23)
CO2: 27 mmol/L (ref 22–32)
Calcium: 8.9 mg/dL (ref 8.9–10.3)
Chloride: 102 mmol/L (ref 98–111)
Creatinine: 1.16 mg/dL — ABNORMAL HIGH (ref 0.44–1.00)
GFR, Estimated: 52 mL/min — ABNORMAL LOW (ref >60)
Glucose, Bld: 91 mg/dL (ref 70–99)
Potassium: 3.6 mmol/L (ref 3.5–5.1)
Sodium: 139 mmol/L (ref 135–145)
Total Bilirubin: 0.5 mg/dL (ref 0.3–1.2)
Total Protein: 6.5 g/dL (ref 6.5–8.1)

## 2022-10-02 LAB — CBC WITH DIFFERENTIAL (CANCER CENTER ONLY)
Abs Immature Granulocytes: 0.08 10*3/uL — ABNORMAL HIGH (ref 0.00–0.07)
Basophils Absolute: 0.1 10*3/uL (ref 0.0–0.1)
Basophils Relative: 1 %
Eosinophils Absolute: 0.7 10*3/uL — ABNORMAL HIGH (ref 0.0–0.5)
Eosinophils Relative: 8 %
HCT: 44.2 % (ref 36.0–46.0)
Hemoglobin: 14.9 g/dL (ref 12.0–15.0)
Immature Granulocytes: 1 %
Lymphocytes Relative: 30 %
Lymphs Abs: 2.7 10*3/uL (ref 0.7–4.0)
MCH: 30.5 pg (ref 26.0–34.0)
MCHC: 33.7 g/dL (ref 30.0–36.0)
MCV: 90.6 fL (ref 80.0–100.0)
Monocytes Absolute: 0.6 10*3/uL (ref 0.1–1.0)
Monocytes Relative: 7 %
Neutro Abs: 4.9 10*3/uL (ref 1.7–7.7)
Neutrophils Relative %: 53 %
Platelet Count: 167 10*3/uL (ref 150–400)
RBC: 4.88 MIL/uL (ref 3.87–5.11)
RDW: 18.5 % — ABNORMAL HIGH (ref 11.5–15.5)
WBC Count: 9.1 10*3/uL (ref 4.0–10.5)
nRBC: 0 % (ref 0.0–0.2)

## 2022-10-02 LAB — TSH: TSH: 0.18 u[IU]/mL — ABNORMAL LOW (ref 0.350–4.500)

## 2022-10-02 MED ORDER — HEPARIN SOD (PORK) LOCK FLUSH 100 UNIT/ML IV SOLN
500.0000 [IU] | Freq: Once | INTRAVENOUS | Status: AC | PRN
  Administered 2022-10-02: 500 [IU]

## 2022-10-02 MED ORDER — SODIUM CHLORIDE 0.9% FLUSH
10.0000 mL | INTRAVENOUS | Status: DC | PRN
  Administered 2022-10-02: 10 mL

## 2022-10-02 MED ORDER — ONDANSETRON HCL 4 MG/2ML IJ SOLN
8.0000 mg | Freq: Once | INTRAMUSCULAR | Status: AC
  Administered 2022-10-02: 8 mg via INTRAVENOUS
  Filled 2022-10-02: qty 4

## 2022-10-02 MED ORDER — SODIUM CHLORIDE 0.9 % IV SOLN: Freq: Once | INTRAVENOUS | Status: AC

## 2022-10-02 MED ORDER — SODIUM CHLORIDE 0.9% FLUSH
10.0000 mL | Freq: Once | INTRAVENOUS | Status: AC
  Administered 2022-10-02: 10 mL

## 2022-10-02 MED ORDER — SODIUM CHLORIDE 0.9 % IV SOLN
1200.0000 mg | Freq: Once | INTRAVENOUS | Status: AC
  Administered 2022-10-02: 1200 mg via INTRAVENOUS
  Filled 2022-10-02: qty 20

## 2022-10-02 NOTE — Progress Notes (Signed)
[facility] [phone]   [phone]  PROGRESS NOTE  Patient Care Team: Allwardt, Crist Infante, PA-C as PCP - General (Physician Assistant) O'Neal, Ronnald Ramp, MD as PCP - Cardiology (Cardiology) Rachael Fee, MD as Attending Physician (Gastroenterology) Dr. Louie Bun, MD as Consulting Physician (Orthopedic Surgery) Ranee Gosselin, MD as Consulting Physician (Orthopedic Surgery) York Spaniel, MD (Inactive) as Consulting Physician (Neurology) Smitty Cords, OD as Consulting Physician (Optometry) Karel Jarvis Lesle Chris, MD as Consulting Physician (Neurology) Erroll Luna, [facility] (Inactive) (Pharmacist)  Hematological/Oncological History # Small Cell Lung Cancer, Extensive Stage 07/05/2020: CT abdomen for lower abdominal pain. New pulmonary nodular density noted 07/06/2020: CT chest showed 2.2 cm macrolobulated right lower lobe pulmonary nodule (favored) versus pathologically enlarged infrahilar lymph node 10/13/2020: PET CT scan performed, findings show 2 cm right lower lobe lung mass is hypermetabolic and consistent with primary lung neoplasm. Additionally found hypermetabolic 17 mm soft tissue lesion between the descending duodenum and the pancreatic head  10/19/2020: EGD to biopsy hypermetabolic lymph node. Biopsy results show small cell lung cancer 10/26/2020: establish care with Dr. Leonides Schanz  11/13/2020: Cycle 1 Day 1 of Carbo/Etop/Atezolizumab 12/04/2020: Cycle 2 Day 1 of Carbo/Etop/Atezolizumab 11/22-11/25/2022: admitted for E. Coli bacteremia/sepsis. Start of Cycle 3 delayed. 01/02/2021: Cycle 3 Day 1 of Carbo/Etop/Atezolizumab 01/23/2021: Cycle 4 Day 1 of Carbo/Etop/Atezolizumab 02/22/2021: Cycle 5 Day 1 of Atezolizumab Maintenance. Delayed start due to patient's COVID infection.  03/14/2021: Cycle 6 Day 1 of Atezolizumab Maintenance 04/05/2021: Cycle 7 Day 1 of Atezolizumab Maintenance 04/25/2021: Cycle 8 Day 1 of Atezolizumab  Maintenance 05/16/2021: Cycle 9 Day 1 of Atezolizumab Maintenance 06/06/2021: Cycle 10 Day 1 of Atezolizumab Maintenance 06/27/2021:  Cycle 11 Day 1 of Atezolizumab Maintenance 07/18/2021: Cycle 12 Day 1 of Atezolizumab Maintenance 08/08/2021: Cycle 13 Day 1 of Atezolizumab Maintenance 08/29/2021: Cycle 14 Day 1 of Atezolizumab Maintenance 09/19/2021: Cycle 15 Day 1 of Atezolizumab Maintenance 10/10/2021: Cycle 16 Day 1 of Atezolizumab Maintenance 10/31/2021: Cycle 17 Day 1 of Atezolizumab Maintenance 11/21/2021: Cycle 18 Day 1 of Atezolizumab Maintenance 12/12/2021: Cycle 19 Day 1 of Atezolizumab Maintenance 01/02/2022: Cycle 20 Day 1 of Atezolizumab Maintenance 01/23/2022: Cycle 21 Day 1 of Atezolizumab Maintenance 02/13/2022: treatment HELD due to diarrhea.  03/06/2022: Cycle 22 Day 1 of Atezolizumab Maintenance 03/27/2022: Cycle 23 Day 1 of Atezolizumab Maintenance 04/17/2022: Cycle 24 Day 1 of Atezolizumab Maintenance 05/08/2022:  Cycle 25 Day 1 of Atezolizumab Maintenance (Held due to patient preference for treatment holiday) 05/29/2022: Cycle 25 Day 1 of Atezolizumab Maintenance  06/19/2022: Cycle 26 Day 1 of Atezolizumab Maintenance  07/12/2022: Cycle 27 Day 1 of Atezolizumab Maintenance 07/31/2022: Cycle 28 Day 1 of Atezolizumab Maintenance HELD per patient request for fatigue.  08/22/2022: Cycle 28 Day 1 of Atezolizumab Maintenance 09/11/2022: Cycle 29 Day 1 of Atezolizumab Maintenance 10/03/2022: Cycle 30 Day 1 of Atezolizumab Maintenance  Interval History:  [name] 67 y.o. female with medical history significant for extensive stage small cell lung cancer who presents for a follow up visit. The patient's last visit was on 09/11/2022.  She presents today to start cycle 30 of chemotherapy.   On exam today [name] reports reports that unfortunately her appetite has been poor and she is continuing to lose weight.  She is down to 179 pounds, down 10 pounds from August.  She reports that she  feels like she is under a lot of stress.  She is having a lot of fatigue and diarrhea as well as  nausea 1 week after receiving treatment but typically resolves after that 7-day.  She reports that during her last cycle unfortunately she only had "1 good week".  She notes she is not having any infectious symptoms such as runny nose, sore throat, cough.  She reports that otherwise she remains at her baseline level of health and is willing and able to proceed with atezolizumab therapy at this time..  She denies fevers, chills, sweats, chest pain or cough. She has no other complaints.A full 10 point ROS is listed below.   MEDICAL HISTORY:  Past Medical History:  Diagnosis Date   Allergic rhinitis    Anemia    Anxiety    Chicken pox    Chronic back pain    COPD (chronic obstructive pulmonary disease) (HCC)    Depression    DM (diabetes mellitus) (HCC)    Essential hypertension    GERD (gastroesophageal reflux disease)    Headache    migraines   History of gastritis    EGD 2015   History of home oxygen therapy    2 liters at hs last 6 months   Hyperlipidemia    Hypothyroidism    Migraines    Osteoarthritis    oa   Scoliosis    Small cell lung cancer (HCC)    Stage 4    SURGICAL HISTORY: Past Surgical History:  Procedure Laterality Date   APPENDECTOMY     1985   BIOPSY  07/24/2017   Procedure: BIOPSY;  Surgeon: Rachael Fee, MD;  Location: [facility];  Service: Endoscopy;;   CARDIAC CATHETERIZATION N/A 10/31/2015   Procedure: Left Heart Cath and Coronary Angiography;  Surgeon: Marykay Lex, MD;  Location[facility] INVASIVE CV LAB;  Service: Cardiovascular;  Laterality: N/A;   CARPAL TUNNEL RELEASE Left    CARPAL TUNNEL RELEASE Right    CHOLECYSTECTOMY  late 1980's   COLONOSCOPY WITH PROPOFOL N/A 07/24/2017   Procedure: COLONOSCOPY WITH PROPOFOL;  Surgeon: Rachael Fee, MD;  Location: [facility];  Service: Endoscopy;  Laterality: N/A;   ESOPHAGOGASTRODUODENOSCOPY N/A  07/24/2017   Procedure: ESOPHAGOGASTRODUODENOSCOPY (EGD);  Surgeon: Rachael Fee, MD;  Location: [facility];  Service: Endoscopy;  Laterality: N/A;   ESOPHAGOGASTRODUODENOSCOPY (EGD) WITH PROPOFOL N/A 10/19/2020   Procedure: ESOPHAGOGASTRODUODENOSCOPY (EGD) WITH PROPOFOL;  Surgeon: Rachael Fee, MD;  Location: [facility];  Service: Endoscopy;  Laterality: N/A;   FINE NEEDLE ASPIRATION N/A 10/19/2020   Procedure: FINE NEEDLE ASPIRATION (FNA) LINEAR;  Surgeon: Rachael Fee, MD;  Location: [facility];  Service: Endoscopy;  Laterality: N/A;   GALLBLADDER SURGERY  1991   HIP CLOSED REDUCTION Right 01/08/2016   Procedure: CLOSED MANIPULATION HIP;  Surgeon: Jene Every, MD;  Location: [facility];  Service: Orthopedics;  Laterality: Right;   HIP CLOSED REDUCTION Right 01/19/2016   Procedure: ATTEMPTED CLOSED REDUCTION RIGHT HIP;  Surgeon: Toni Arthurs, MD;  Location: [facility];  Service: Orthopedics;  Laterality: Right;   HIP CLOSED REDUCTION Right 01/20/2016   Procedure: CLOSED REDUCTION RIGHT TOTAL HIP;  Surgeon: Durene Romans, MD;  Location: [facility];  Service: Orthopedics;  Laterality: Right;   HIP CLOSED REDUCTION Right 02/17/2016   Procedure: CLOSED REDUCTION RIGHT TOTAL HIP;  Surgeon: Samson Frederic, MD;  Location: [facility];  Service: Orthopedics;  Laterality: Right;   HIP CLOSED REDUCTION Right 02/28/2016   Procedure: CLOSED REDUCTION HIP;  Surgeon: Yolonda Kida, MD;  Location: [facility];  Service: Orthopedics;  Laterality: Right;   IR IMAGING GUIDED PORT  INSERTION  11/01/2020   POLYPECTOMY  07/24/2017   Procedure: POLYPECTOMY;  Surgeon: Rachael Fee, MD;  Location: [facility];  Service: Endoscopy;;   TONSILLECTOMY     TOTAL ABDOMINAL HYSTERECTOMY     1985, with 1 ovary removed and 2 nd ovary removed 2003   TOTAL HIP ARTHROPLASTY Right    Original surgery 2006 with revision 2010   TOTAL HIP REVISION Right 01/01/2016   Procedure: TOTAL HIP REVISION;  Surgeon: Durene Romans, MD;   Location: [facility];  Service: Orthopedics;  Laterality: Right;   TOTAL HIP REVISION Right 03/02/2016   Procedure: TOTAL HIP REVISION;  Surgeon: Durene Romans, MD;  Location: [facility];  Service: Orthopedics;  Laterality: Right;   TOTAL HIP REVISION Right 09/02/2016   Procedure: Right hip constrained liner- posterior;  Surgeon: Durene Romans, MD;  Location: [facility];  Service: Orthopedics;  Laterality: Right;   ULNAR NERVE TRANSPOSITION Right    UPPER ESOPHAGEAL ENDOSCOPIC ULTRASOUND (EUS) N/A 10/19/2020   Procedure: UPPER ESOPHAGEAL ENDOSCOPIC ULTRASOUND (EUS);  Surgeon: Rachael Fee, MD;  Location: [facility];  Service: Endoscopy;  Laterality: N/A;  periduodenal lesion    SOCIAL HISTORY: Social History   Socioeconomic History   Marital status: Married    Spouse name: Not on file   Number of children: 2   Years of education: Not on file   Highest education level: Not on file  Occupational History   Occupation: disabled   Occupation: disabled  Tobacco Use   Smoking status: Every Day    Current packs/day: 2.00    Average packs/day: 2.0 packs/day for 46.0 years (92.0 ttl pk-yrs)    Types: Cigarettes   Smokeless tobacco: Never   Tobacco comments:    2 packs of cigarettes smoked daily 12/13/21- declines smoking cessation  Vaping Use   Vaping status: Never Used  Substance and Sexual Activity   Alcohol use: No   Drug use: No   Sexual activity: Not Currently    Partners: Male  Other Topics Concern   Not on file  Social History Narrative   Right handed    Caffeine~ 2 cups per day    Lives at home with husband (strained relationship)   Primary caretaker for disabled brother who had aneurism   Daughter died 07-10-18   Social Determinants of Health   Financial Resource Strain: Low Risk  (05/16/2022)   Overall Financial Resource Strain (CARDIA)    Difficulty of Paying Living Expenses: Not hard at all  Food Insecurity: No Food Insecurity (05/16/2022)   Hunger Vital Sign    Worried  About Running Out of Food in the Last Year: Never true    Ran Out of Food in the Last Year: Never true  Transportation Needs: No Transportation Needs (05/16/2022)   PRAPARE - Administrator, Civil Service (Medical): No    Lack of Transportation (Non-Medical): No  Physical Activity: Inactive (05/16/2022)   Exercise Vital Sign    Days of Exercise per Week: 0 days    Minutes of Exercise per Session: 0 min  Stress: Stress Concern Present (05/16/2022)   [employer] of Occupational Health - Occupational Stress Questionnaire    Feeling of Stress : To some extent  Social Connections: Moderately Isolated (05/16/2022)   Social Connection and Isolation Panel [NHANES]    Frequency of Communication with Friends and Family: More than three times a week    Frequency of Social Gatherings with Friends and Family: Once a week  Attends Religious Services: Never    Active Member of Clubs or Organizations: No    Attends Banker Meetings: Never    Marital Status: Married  Catering manager Violence: Not At Risk (05/16/2022)   Humiliation, Afraid, Rape, and Kick questionnaire    Fear of Current or Ex-Partner: No    Emotionally Abused: No    Physically Abused: No    Sexually Abused: No    FAMILY HISTORY: Family History  Problem Relation Age of Onset   COPD Mother    Heart disease Mother    Lung disease Father        Asbestosis   Heart attack Father    Heart disease Father    Cerebral aneurysm Brother    Aneurysm Brother        Brain   Drug abuse Daughter    Epilepsy Son    Alcohol abuse Son    Drug abuse Son    Arthritis Maternal Grandmother    Heart disease Maternal Grandmother    Asthma Maternal Grandfather    Cancer Maternal Grandfather    Arthritis Paternal Grandmother    Heart disease Paternal Grandmother    Stroke Paternal Grandmother    Early death Paternal Grandfather    Heart disease Paternal Grandfather     ALLERGIES:  is allergic to metformin and  related, nsaids, wellbutrin [bupropion], aleve [naproxen sodium], codeine, penicillins, and sulfonamide derivatives.  MEDICATIONS:  Current Outpatient Medications  Medication Sig Dispense Refill   albuterol (PROAIR HFA) 108 (90 Base) MCG/ACT inhaler 2 puffs every 4 hours as needed only  if your can't catch your breath 18 g 5   ALPRAZolam (XANAX) 1 MG tablet Take 1 tablet (1 mg total) by mouth 3 (three) times daily as needed. for anxiety 90 tablet 2   atorvastatin (LIPITOR) 20 MG tablet TAKE 1 TABLET BY MOUTH AT BEDTIME 90 tablet 1   benzonatate (TESSALON) 100 MG capsule Take 1 capsule (100 mg total) by mouth 3 (three) times daily. 90 capsule 1   calcipotriene (DOVONOX) 0.005 % cream Apply to affected area bid when on break from steroid cream 120 g 3   cyclobenzaprine (FLEXERIL) 10 MG tablet Take 10 mg by mouth 3 (three) times daily as needed.     dicyclomine (BENTYL) 10 MG capsule Take 1 capsule (10 mg total) by mouth every 6 (six) hours as needed for spasms. 90 capsule 3   diphenoxylate-atropine (LOMOTIL) 2.5-0.025 MG tablet Take 1 tablet by mouth 4 (four) times daily as needed for diarrhea or loose stools. 60 tablet 0   DULoxetine (CYMBALTA) 60 MG capsule TAKE TWO CAPSULES BY MOUTH EVERY DAY 180 capsule 1   FEROSUL 325 (65 Fe) MG tablet Take 325 mg by mouth daily with breakfast.     fluconazole (DIFLUCAN) 150 MG tablet Take 1 tablet po once. May repeat dose in 3 days prn persistent symptoms. 3 tablet 0   fluticasone (FLONASE) 50 MCG/ACT nasal spray INSTILL 2 SPRAYS IN EACH NOSTRIL EVERY DAY 16 g 6   Fluticasone-Umeclidin-Vilant (TRELEGY ELLIPTA) 200-62.5-25 MCG/ACT AEPB Inhale 1 puff into the lungs daily at 2 PM. 60 each 5   furosemide (LASIX) 20 MG tablet TAKE 1 TABLET BY MOUTH TWICE DAILY, IF SWELLING IMPROVES CAN HOLD MEDICATION 90 tablet 0   HYDROcodone-acetaminophen (NORCO) 10-325 MG tablet Take 1 tablet by mouth every 4 (four) hours as needed. 150 tablet 0   hydrOXYzine (ATARAX) 10 MG  tablet TAKE 1 TABLET BY MOUTH THREE TIMES DAILY AS  NEEDED FOR ITCHING 30 tablet 2   levothyroxine (SYNTHROID) 137 MCG tablet Take 1 tablet (137 mcg total) by mouth daily before breakfast. 90 tablet 1   loratadine (CLARITIN) 10 MG tablet Take 10 mg by mouth daily.     meloxicam (MOBIC) 15 MG tablet TAKE 1 TABLET BY MOUTH EVERY DAY 30 tablet 2   nystatin (MYCOSTATIN) 100000 UNIT/ML suspension Swish and swallow with 5 ml up to QID prn for thrush 400 mL 0   OLANZapine (ZYPREXA) 10 MG tablet Take 1 tablet (10 mg total) by mouth at bedtime. 30 tablet 2   omeprazole (PRILOSEC) 40 MG capsule TAKE ONE CAPSULE BY MOUTH TWICE DAILY 180 capsule 3   ondansetron (ZOFRAN) 8 MG tablet Take 1 tablet (8 mg total) by mouth 2 (two) times daily. 60 tablet 1   Pancrelipase, Lip-Prot-Amyl, (CREON PO) Take by mouth.     permethrin (ELIMITE) 5 % cream      potassium chloride SA (KLOR-CON M) 20 MEQ tablet TAKE 1 TABLET BY MOUTH TWICE DAILY 60 tablet 2   prochlorperazine (COMPAZINE) 10 MG tablet Take 1 tablet (10 mg total) by mouth every 6 (six) hours as needed for nausea or vomiting. 30 tablet 0   Rimegepant Sulfate (NURTEC) 75 MG TBDP Take 1 tablet (75 mg total) by mouth daily as needed (for migraine-). Take within 15 minutes of initial symptoms 8 tablet 5   triamcinolone ointment (KENALOG) 0.5 % Apply 1 Application topically 3 (three) times daily. Apply thin-layer on affected areas, no more than 2 weeks consistently. 60 g 2   No current facility-administered medications for this visit.    REVIEW OF SYSTEMS:   Constitutional: ( - ) fevers, ( - )  chills , ( - ) night sweats Eyes: ( - ) blurriness of vision, ( - ) double vision, ( - ) watery eyes Ears, nose, mouth, throat, and face: ( - ) mucositis, ( - ) sore throat Respiratory: ( - ) cough, ( -) dyspnea, ( - ) wheezes Cardiovascular: ( - ) palpitation, ( - ) chest discomfort, ( - ) lower extremity swelling Gastrointestinal:  ( +) nausea, ( - ) heartburn, ( - )  change in bowel habits Skin: ( - ) abnormal skin rashes Lymphatics: ( - ) new lymphadenopathy, ( - ) easy bruising Neurological: (+ ) numbness, ( - ) tingling, ( - ) new weaknesses Behavioral/Psych: ( - ) mood change, ( - ) new changes  All other systems were reviewed with the patient and are negative.  PHYSICAL EXAMINATION: ECOG PERFORMANCE STATUS: 1 - Symptomatic but completely ambulatory  Vitals:   10/02/22 1118  BP: 136/88  Pulse: 93  Resp: 16  Temp: 98.1 F (36.7 C)  SpO2: 97%         Filed Weights   10/02/22 1118  Weight: 179 lb 14.4 oz (81.6 kg)          GENERAL: Well-appearing middle-age Caucasian female, alert, no distress and comfortable SKIN: skin color, texture, turgor are normal, no rashes or significant lesions EYES: conjunctiva are pink and non-injected, sclera clear LUNGS:  normal breathing effort. Diffuse wheezing hear on ausculation.  HEART: regular rate & rhythm and no murmurs. Mild bilateral lower extremity edema Musculoskeletal: no cyanosis of digits and no clubbing  PSYCH: alert & oriented x 3, fluent speech NEURO: no focal motor/sensory deficits  LABORATORY DATA:  I have reviewed the data as listed    Latest Ref Rng & Units 10/02/2022   10:36 AM  09/10/2022    1:18 PM 08/22/2022    9:44 AM  CBC  WBC 4.0 - 10.5 K/uL 9.1  6.5  9.4   Hemoglobin 12.0 - 15.0 g/dL 87.5  64.3  32.9   Hematocrit 36.0 - 46.0 % 44.2  39.6  43.7   Platelets 150 - 400 K/uL 167  175  172        Latest Ref Rng & Units 10/02/2022   10:36 AM 09/10/2022    1:18 PM 08/22/2022    9:44 AM  CMP  Glucose 70 - 99 mg/dL 91  518  841   BUN 8 - 23 mg/dL 15  9  9    Creatinine 0.44 - 1.00 mg/dL 6.60  6.30  1.60   Sodium 135 - 145 mmol/L 139  139  126   Potassium 3.5 - 5.1 mmol/L 3.6  4.0  3.5   Chloride 98 - 111 mmol/L 102  110  91   CO2 22 - 32 mmol/L 27  25  27    Calcium 8.9 - 10.3 mg/dL 8.9  7.9  8.6   Total Protein 6.5 - 8.1 g/dL 6.5  5.5  6.3   Total Bilirubin 0.3 -  1.2 mg/dL 0.5  0.3  0.4   Alkaline Phos 38 - 126 U/L 72  71  71   AST 15 - 41 U/L 15  11  12    ALT 0 - 44 U/L 10  10  12      No results found for: "MPROTEIN" Lab Results  Component Value Date   KPAFRELGTCHN 0.75 06/30/2014   LAMBDASER 3.78 (H) 06/30/2014   KAPLAMBRATIO 0.20 (L) 06/30/2014     RADIOGRAPHIC STUDIES: No results found.  ASSESSMENT & PLAN [name] 67 y.o. female with medical history significant for extensive stage small cell lung cancer who presents for a follow up visit.   After review of the labs, review of the records, and discussion with the patient the patients findings are most consistent with extensive stage small cell lung cancer with metastasis from the right lower lobe to the lymph nodes of the abdomen.  At this time we will pursue triple therapy with carboplatin, etoposide, and atezolizumab.  After 4 cycles we will convert to maintenance atezolizumab alone.  We previously discussed the risks and benefits of this therapy and the patient was in agreement to proceed with this treatment.  The treatment of choice consist of carboplatin, etoposide, and atezolizumab.  The regimen consists of carboplatin AUC of 5 IV on day 1, etoposide 100 mg per metered squared IV on day 1, 2, and 3 and atezolizumab 1200 mg on day 1.  This continues for 21-day cycles.  After 4 cycles the patient proceeds with atezolizumab maintenance therapy alone.    # Small Cell Lung Cancer, Extensive Stage -- MRI of the brain shows no evidence of intracranial spread --Findings are currently consistent with metastatic small cell lung cancer with metastatic spread to the lymph nodes of the abdomen -- Started carboplatin, etoposide, and atezolizumab on 11/13/2020. Transitioned to maintenance Atezolizumab on 02/22/2021.  -- Most recent CT CAP from 04/26/2022 which shows no evidence of recurrence.  Plan: --today is Cycle 31 Day 1 of Atezolizumab maintenance. Patient would like to HOLD treatment due  to fatigue.  --labs today were reviewed and adequate for treatment. Hgb 14.9, MCV 90.6, platelets 167, and white blood cell count 9.1 --patient is willing and able to proceed with treatment at this time.  --CT scan to be  collected every 3 months time.  Last scan on 07/26/2022 showed no evidence of residual recurrent disease.  Repeat scan due late Sept 2024 --RTC in 3 weeks for a follow up before Cycle 32  # Early Cirrhosis --noted on MR abdomen from 05/22/2022. [place] with elastography performed on 06/18/2022. --continue to follow with GI.   #Right renal lesion: --Noted to be benign on most recent MRI of the abdomen.  #Diarrhea-improved --stool sent for C. Diff and GI pathology panel, all negative --patient following with GI and found to have pancreatic insufficiency and started on creon with improvement of symptoms.   #Nausea: --Symptoms improved with zofran and olanzapine.  --Added IV zofran to infusion premeds for better symptom control.   #Hypokalemia:  --currently taking potassium chloride 20 meq BID --potassium level is still 3.6 today. Continue PO supplementation.   #Lower extremity edema --Currently takes lasix 20 mg 1-2 times a day. --Stable. Continue to monitor.   #Neuropathy involving fingers/feet: --Patient currently takes Cymbalta   #Supportive Care -- chemotherapy education complete -- port placed -- zofran 8mg  q8H PRN and compazine 10mg  PO q6H for nausea -- EMLA cream for port  No orders of the defined types were placed in this encounter.  All questions were answered. The patient knows to call the clinic with any problems, questions or concerns.  I have spent a total of 30 minutes minutes of face-to-face and non-face-to-face time, preparing to see the patient, performing a medically appropriate examination, counseling and educating the patient, ordering medications/tests, documenting clinical information in the electronic health record, and care coordination.    Ulysees Barns, MD Department of Hematology/Oncology [facility] at [facility] Phone: [phone] Pager: [phone] Email: [email or web address]  10/05/2022 5:04 PM

## 2022-10-04 ENCOUNTER — Other Ambulatory Visit: Payer: Self-pay | Admitting: Physical Medicine and Rehabilitation

## 2022-10-04 LAB — T4: T4, Total: 10.5 ug/dL (ref 4.5–12.0)

## 2022-10-05 ENCOUNTER — Encounter: Payer: Self-pay | Admitting: Hematology and Oncology

## 2022-10-07 MED ORDER — HYDROCODONE-ACETAMINOPHEN 10-325 MG PO TABS: 1.0000 | ORAL_TABLET | ORAL | 0 refills | Status: DC | PRN

## 2022-10-10 ENCOUNTER — Telehealth: Payer: Self-pay | Admitting: *Deleted

## 2022-10-10 ENCOUNTER — Other Ambulatory Visit: Payer: Self-pay | Admitting: *Deleted

## 2022-10-10 DIAGNOSIS — B370 Candidal stomatitis: Secondary | ICD-10-CM

## 2022-10-10 MED ORDER — FLUCONAZOLE 150 MG PO TABS: ORAL_TABLET | ORAL | 1 refills | Status: DC

## 2022-10-10 NOTE — Telephone Encounter (Signed)
Received call from pt asking for a refill of her Diflucan. She has oral thrush again and this helped her a lot in the past. Ok per Dr. Leonides Schanz to refill   Refill sent to [facility] drugs

## 2022-10-11 ENCOUNTER — Other Ambulatory Visit: Payer: Self-pay | Admitting: Hematology and Oncology

## 2022-10-15 ENCOUNTER — Ambulatory Visit: Payer: 59 | Admitting: Dermatology

## 2022-10-18 ENCOUNTER — Other Ambulatory Visit: Payer: Self-pay | Admitting: Hematology and Oncology

## 2022-10-21 ENCOUNTER — Ambulatory Visit (HOSPITAL_COMMUNITY)
Admission: RE | Admit: 2022-10-21 | Discharge: 2022-10-21 | Disposition: A | Discharge status: Home / Self Care | Payer: 59 | Source: Ambulatory Visit | Attending: Hematology and Oncology | Admitting: Hematology and Oncology

## 2022-10-21 DIAGNOSIS — D3502 Benign neoplasm of left adrenal gland: Secondary | ICD-10-CM | POA: Diagnosis not present

## 2022-10-21 DIAGNOSIS — C3490 Malignant neoplasm of unspecified part of unspecified bronchus or lung: Secondary | ICD-10-CM | POA: Insufficient documentation

## 2022-10-21 DIAGNOSIS — I700 Atherosclerosis of aorta: Secondary | ICD-10-CM | POA: Diagnosis not present

## 2022-10-21 MED ORDER — SODIUM CHLORIDE (PF) 0.9 % IJ SOLN
INTRAMUSCULAR | Status: AC
  Filled 2022-10-21: qty 50

## 2022-10-21 MED ORDER — IOHEXOL 300 MG/ML  SOLN
100.0000 mL | Freq: Once | INTRAMUSCULAR | Status: AC | PRN
  Administered 2022-10-21: 100 mL via INTRAVENOUS

## 2022-10-22 ENCOUNTER — Other Ambulatory Visit: Payer: Self-pay | Admitting: Physician Assistant

## 2022-10-22 ENCOUNTER — Encounter: Payer: Self-pay | Admitting: Hematology and Oncology

## 2022-10-22 DIAGNOSIS — C3490 Malignant neoplasm of unspecified part of unspecified bronchus or lung: Secondary | ICD-10-CM

## 2022-10-23 ENCOUNTER — Inpatient Hospital Stay: Payer: 59

## 2022-10-23 ENCOUNTER — Inpatient Hospital Stay (HOSPITAL_BASED_OUTPATIENT_CLINIC_OR_DEPARTMENT_OTHER): Payer: 59 | Admitting: Hematology and Oncology

## 2022-10-23 ENCOUNTER — Inpatient Hospital Stay: Payer: 59 | Admitting: Dietician

## 2022-10-23 VITALS — BP 117/78 | HR 86

## 2022-10-23 DIAGNOSIS — E785 Hyperlipidemia, unspecified: Secondary | ICD-10-CM | POA: Diagnosis not present

## 2022-10-23 DIAGNOSIS — E876 Hypokalemia: Secondary | ICD-10-CM | POA: Diagnosis not present

## 2022-10-23 DIAGNOSIS — C3431 Malignant neoplasm of lower lobe, right bronchus or lung: Secondary | ICD-10-CM | POA: Diagnosis not present

## 2022-10-23 DIAGNOSIS — E119 Type 2 diabetes mellitus without complications: Secondary | ICD-10-CM | POA: Diagnosis not present

## 2022-10-23 DIAGNOSIS — Z8719 Personal history of other diseases of the digestive system: Secondary | ICD-10-CM | POA: Diagnosis not present

## 2022-10-23 DIAGNOSIS — C3490 Malignant neoplasm of unspecified part of unspecified bronchus or lung: Secondary | ICD-10-CM

## 2022-10-23 DIAGNOSIS — Z882 Allergy status to sulfonamides status: Secondary | ICD-10-CM | POA: Diagnosis not present

## 2022-10-23 DIAGNOSIS — R5383 Other fatigue: Secondary | ICD-10-CM | POA: Diagnosis not present

## 2022-10-23 DIAGNOSIS — R600 Localized edema: Secondary | ICD-10-CM | POA: Diagnosis not present

## 2022-10-23 DIAGNOSIS — Z885 Allergy status to narcotic agent status: Secondary | ICD-10-CM | POA: Diagnosis not present

## 2022-10-23 DIAGNOSIS — J449 Chronic obstructive pulmonary disease, unspecified: Secondary | ICD-10-CM | POA: Diagnosis not present

## 2022-10-23 DIAGNOSIS — G8929 Other chronic pain: Secondary | ICD-10-CM | POA: Diagnosis not present

## 2022-10-23 DIAGNOSIS — K219 Gastro-esophageal reflux disease without esophagitis: Secondary | ICD-10-CM | POA: Diagnosis not present

## 2022-10-23 DIAGNOSIS — Z5112 Encounter for antineoplastic immunotherapy: Secondary | ICD-10-CM | POA: Diagnosis not present

## 2022-10-23 DIAGNOSIS — R197 Diarrhea, unspecified: Secondary | ICD-10-CM | POA: Diagnosis not present

## 2022-10-23 DIAGNOSIS — I10 Essential (primary) hypertension: Secondary | ICD-10-CM | POA: Diagnosis not present

## 2022-10-23 DIAGNOSIS — Z79620 Long term (current) use of immunosuppressive biologic: Secondary | ICD-10-CM | POA: Diagnosis not present

## 2022-10-23 DIAGNOSIS — F17210 Nicotine dependence, cigarettes, uncomplicated: Secondary | ICD-10-CM | POA: Diagnosis not present

## 2022-10-23 DIAGNOSIS — Z886 Allergy status to analgesic agent status: Secondary | ICD-10-CM | POA: Diagnosis not present

## 2022-10-23 DIAGNOSIS — Z79899 Other long term (current) drug therapy: Secondary | ICD-10-CM | POA: Diagnosis not present

## 2022-10-23 DIAGNOSIS — Z880 Allergy status to penicillin: Secondary | ICD-10-CM | POA: Diagnosis not present

## 2022-10-23 DIAGNOSIS — G629 Polyneuropathy, unspecified: Secondary | ICD-10-CM | POA: Diagnosis not present

## 2022-10-23 LAB — CBC WITH DIFFERENTIAL (CANCER CENTER ONLY)
Abs Immature Granulocytes: 0.06 10*3/uL (ref 0.00–0.07)
Basophils Absolute: 0.1 10*3/uL (ref 0.0–0.1)
Basophils Relative: 1 %
Eosinophils Absolute: 0.6 10*3/uL — ABNORMAL HIGH (ref 0.0–0.5)
Eosinophils Relative: 6 %
HCT: 45.5 % (ref 36.0–46.0)
Hemoglobin: 15.1 g/dL — ABNORMAL HIGH (ref 12.0–15.0)
Immature Granulocytes: 1 %
Lymphocytes Relative: 30 %
Lymphs Abs: 3 10*3/uL (ref 0.7–4.0)
MCH: 30.6 pg (ref 26.0–34.0)
MCHC: 33.2 g/dL (ref 30.0–36.0)
MCV: 92.3 fL (ref 80.0–100.0)
Monocytes Absolute: 0.6 10*3/uL (ref 0.1–1.0)
Monocytes Relative: 6 %
Neutro Abs: 5.7 10*3/uL (ref 1.7–7.7)
Neutrophils Relative %: 56 %
Platelet Count: 208 10*3/uL (ref 150–400)
RBC: 4.93 MIL/uL (ref 3.87–5.11)
RDW: 16.5 % — ABNORMAL HIGH (ref 11.5–15.5)
WBC Count: 9.9 10*3/uL (ref 4.0–10.5)
nRBC: 0 % (ref 0.0–0.2)

## 2022-10-23 LAB — CMP (CANCER CENTER ONLY)
ALT: 9 U/L (ref 0–44)
AST: 13 U/L — ABNORMAL LOW (ref 15–41)
Albumin: 3.6 g/dL (ref 3.5–5.0)
Alkaline Phosphatase: 74 U/L (ref 38–126)
Anion gap: 8 (ref 5–15)
BUN: 15 mg/dL (ref 8–23)
CO2: 25 mmol/L (ref 22–32)
Calcium: 8.8 mg/dL — ABNORMAL LOW (ref 8.9–10.3)
Chloride: 104 mmol/L (ref 98–111)
Creatinine: 1.32 mg/dL — ABNORMAL HIGH (ref 0.44–1.00)
GFR, Estimated: 45 mL/min — ABNORMAL LOW (ref >60)
Glucose, Bld: 101 mg/dL — ABNORMAL HIGH (ref 70–99)
Potassium: 4 mmol/L (ref 3.5–5.1)
Sodium: 137 mmol/L (ref 135–145)
Total Bilirubin: 0.4 mg/dL (ref 0.3–1.2)
Total Protein: 6.3 g/dL — ABNORMAL LOW (ref 6.5–8.1)

## 2022-10-23 LAB — T4, FREE: Free T4: 1.02 ng/dL (ref 0.61–1.12)

## 2022-10-23 LAB — TSH: TSH: 0.225 u[IU]/mL — ABNORMAL LOW (ref 0.350–4.500)

## 2022-10-23 MED ORDER — SODIUM CHLORIDE 0.9 % IV SOLN
1200.0000 mg | Freq: Once | INTRAVENOUS | Status: AC
  Administered 2022-10-23: 1200 mg via INTRAVENOUS
  Filled 2022-10-23: qty 20

## 2022-10-23 MED ORDER — HEPARIN SOD (PORK) LOCK FLUSH 100 UNIT/ML IV SOLN
500.0000 [IU] | Freq: Once | INTRAVENOUS | Status: AC | PRN
  Administered 2022-10-23: 500 [IU]

## 2022-10-23 MED ORDER — SODIUM CHLORIDE 0.9% FLUSH
10.0000 mL | INTRAVENOUS | Status: DC | PRN
  Administered 2022-10-23: 10 mL

## 2022-10-23 MED ORDER — SODIUM CHLORIDE 0.9 % IV SOLN: Freq: Once | INTRAVENOUS | Status: AC

## 2022-10-23 MED ORDER — ONDANSETRON HCL 4 MG/2ML IJ SOLN
8.0000 mg | Freq: Once | INTRAMUSCULAR | Status: AC
  Administered 2022-10-23: 8 mg via INTRAVENOUS
  Filled 2022-10-23: qty 4

## 2022-10-23 NOTE — Progress Notes (Signed)
[facility] [phone]   [phone]  PROGRESS NOTE  Patient Care Team: Allwardt, Crist Infante, PA-C as PCP - General (Physician Assistant) O'Neal, Ronnald Ramp, MD as PCP - Cardiology (Cardiology) Rachael Fee, MD as Attending Physician (Gastroenterology) Dr. Louie Bun, MD as Consulting Physician (Orthopedic Surgery) Ranee Gosselin, MD as Consulting Physician (Orthopedic Surgery) York Spaniel, MD (Inactive) as Consulting Physician (Neurology) Smitty Cords, OD as Consulting Physician (Optometry) Karel Jarvis Lesle Chris, MD as Consulting Physician (Neurology) Erroll Luna, [facility] (Inactive) (Pharmacist)  Hematological/Oncological History # Small Cell Lung Cancer, Extensive Stage 07/05/2020: CT abdomen for lower abdominal pain. New pulmonary nodular density noted 07/06/2020: CT chest showed 2.2 cm macrolobulated right lower lobe pulmonary nodule (favored) versus pathologically enlarged infrahilar lymph node 10/13/2020: PET CT scan performed, findings show 2 cm right lower lobe lung mass is hypermetabolic and consistent with primary lung neoplasm. Additionally found hypermetabolic 17 mm soft tissue lesion between the descending duodenum and the pancreatic head  10/19/2020: EGD to biopsy hypermetabolic lymph node. Biopsy results show small cell lung cancer 10/26/2020: establish care with Dr. Leonides Schanz  11/13/2020: Cycle 1 Day 1 of Carbo/Etop/Atezolizumab 12/04/2020: Cycle 2 Day 1 of Carbo/Etop/Atezolizumab 11/22-11/25/2022: admitted for E. Coli bacteremia/sepsis. Start of Cycle 3 delayed. 01/02/2021: Cycle 3 Day 1 of Carbo/Etop/Atezolizumab 01/23/2021: Cycle 4 Day 1 of Carbo/Etop/Atezolizumab 02/22/2021: Cycle 5 Day 1 of Atezolizumab Maintenance. Delayed start due to patient's COVID infection.  03/14/2021: Cycle 6 Day 1 of Atezolizumab Maintenance 04/05/2021: Cycle 7 Day 1 of Atezolizumab Maintenance 04/25/2021: Cycle 8 Day 1 of Atezolizumab  Maintenance 05/16/2021: Cycle 9 Day 1 of Atezolizumab Maintenance 06/06/2021: Cycle 10 Day 1 of Atezolizumab Maintenance 06/27/2021:  Cycle 11 Day 1 of Atezolizumab Maintenance 07/18/2021: Cycle 12 Day 1 of Atezolizumab Maintenance 08/08/2021: Cycle 13 Day 1 of Atezolizumab Maintenance 08/29/2021: Cycle 14 Day 1 of Atezolizumab Maintenance 09/19/2021: Cycle 15 Day 1 of Atezolizumab Maintenance 10/10/2021: Cycle 16 Day 1 of Atezolizumab Maintenance 10/31/2021: Cycle 17 Day 1 of Atezolizumab Maintenance 11/21/2021: Cycle 18 Day 1 of Atezolizumab Maintenance 12/12/2021: Cycle 19 Day 1 of Atezolizumab Maintenance 01/02/2022: Cycle 20 Day 1 of Atezolizumab Maintenance 01/23/2022: Cycle 21 Day 1 of Atezolizumab Maintenance 02/13/2022: treatment HELD due to diarrhea.  03/06/2022: Cycle 22 Day 1 of Atezolizumab Maintenance 03/27/2022: Cycle 23 Day 1 of Atezolizumab Maintenance 04/17/2022: Cycle 24 Day 1 of Atezolizumab Maintenance 05/08/2022:  Cycle 25 Day 1 of Atezolizumab Maintenance (Held due to patient preference for treatment holiday) 05/29/2022: Cycle 25 Day 1 of Atezolizumab Maintenance  06/19/2022: Cycle 26 Day 1 of Atezolizumab Maintenance  07/12/2022: Cycle 27 Day 1 of Atezolizumab Maintenance 07/31/2022: Cycle 28 Day 1 of Atezolizumab Maintenance HELD per patient request for fatigue.  08/22/2022: Cycle 28 Day 1 of Atezolizumab Maintenance 09/11/2022: Cycle 29 Day 1 of Atezolizumab Maintenance 10/03/2022: Cycle 30 Day 1 of Atezolizumab Maintenance 10/23/2022: Cycle 31 Day 1 of Atezolizumab Maintenance  Interval History:  TAMMA MANDELL 67 y.o. female with medical history significant for extensive stage small cell lung cancer who presents for a follow up visit. The patient's last visit was on 10/02/2022.  She presents today to start cycle 31 of chemotherapy.   On exam today Mrs. Arnoux reports she has had 8 teeth pulled and unfortunately is not eating as well as she should be.  She has lost 9 pounds in the  interim since her last visit.  She reports that it is "not much fun" trying to eat.  She reports that  she continues to struggle with fatigue and feels "so tired".  She notes that she is also quite overwhelmed and feels like she "cannot get anything done".  Otherwise she is tolerating immunotherapy well with no nausea, vomiting, or diarrhea.  She reports that otherwise she remains at her baseline level of health and is willing and able to proceed with atezolizumab therapy at this time.  She denies fevers, chills, sweats, chest pain or cough. She has no other complaints.A full 10 point ROS is listed below.   MEDICAL HISTORY:  Past Medical History:  Diagnosis Date   Allergic rhinitis    Anemia    Anxiety    Chicken pox    Chronic back pain    COPD (chronic obstructive pulmonary disease) (HCC)    Depression    DM (diabetes mellitus) (HCC)    Essential hypertension    GERD (gastroesophageal reflux disease)    Headache    migraines   History of gastritis    EGD 2015   History of home oxygen therapy    2 liters at hs last 6 months   Hyperlipidemia    Hypothyroidism    Migraines    Osteoarthritis    oa   Scoliosis    Small cell lung cancer (HCC)    Stage 4    SURGICAL HISTORY: Past Surgical History:  Procedure Laterality Date   APPENDECTOMY     1985   BIOPSY  07/24/2017   Procedure: BIOPSY;  Surgeon: Rachael Fee, MD;  Location: [facility];  Service: Endoscopy;;   CARDIAC CATHETERIZATION N/A 10/31/2015   Procedure: Left Heart Cath and Coronary Angiography;  Surgeon: Marykay Lex, MD;  Location: [facility] INVASIVE CV LAB;  Service: Cardiovascular;  Laterality: N/A;   CARPAL TUNNEL RELEASE Left    CARPAL TUNNEL RELEASE Right    CHOLECYSTECTOMY  late 1980's   COLONOSCOPY WITH PROPOFOL N/A 07/24/2017   Procedure: COLONOSCOPY WITH PROPOFOL;  Surgeon: Rachael Fee, MD;  Location: [facility];  Service: Endoscopy;  Laterality: N/A;   ESOPHAGOGASTRODUODENOSCOPY N/A 07/24/2017    Procedure: ESOPHAGOGASTRODUODENOSCOPY (EGD);  Surgeon: Rachael Fee, MD;  Location: [facility];  Service: Endoscopy;  Laterality: N/A;   ESOPHAGOGASTRODUODENOSCOPY (EGD) WITH PROPOFOL N/A 10/19/2020   Procedure: ESOPHAGOGASTRODUODENOSCOPY (EGD) WITH PROPOFOL;  Surgeon: Rachael Fee, MD;  Location: [facility];  Service: Endoscopy;  Laterality: N/A;   FINE NEEDLE ASPIRATION N/A 10/19/2020   Procedure: FINE NEEDLE ASPIRATION (FNA) LINEAR;  Surgeon: Rachael Fee, MD;  Location: [facility];  Service: Endoscopy;  Laterality: N/A;   GALLBLADDER SURGERY  1991   HIP CLOSED REDUCTION Right 01/08/2016   Procedure: CLOSED MANIPULATION HIP;  Surgeon: Jene Every, MD;  Location: [facility];  Service: Orthopedics;  Laterality: Right;   HIP CLOSED REDUCTION Right 01/19/2016   Procedure: ATTEMPTED CLOSED REDUCTION RIGHT HIP;  Surgeon: Toni Arthurs, MD;  Location: [facility];  Service: Orthopedics;  Laterality: Right;   HIP CLOSED REDUCTION Right 01/20/2016   Procedure: CLOSED REDUCTION RIGHT TOTAL HIP;  Surgeon: Durene Romans, MD;  Location: [facility];  Service: Orthopedics;  Laterality: Right;   HIP CLOSED REDUCTION Right 02/17/2016   Procedure: CLOSED REDUCTION RIGHT TOTAL HIP;  Surgeon: Samson Frederic, MD;  Location: [facility];  Service: Orthopedics;  Laterality: Right;   HIP CLOSED REDUCTION Right 02/28/2016   Procedure: CLOSED REDUCTION HIP;  Surgeon: Yolonda Kida, MD;  Location: [facility];  Service: Orthopedics;  Laterality: Right;   IR IMAGING GUIDED PORT INSERTION  11/01/2020  POLYPECTOMY  07/24/2017   Procedure: POLYPECTOMY;  Surgeon: Rachael Fee, MD;  Location: [facility];  Service: Endoscopy;;   TONSILLECTOMY     TOTAL ABDOMINAL HYSTERECTOMY     1985, with 1 ovary removed and 2 nd ovary removed 2003   TOTAL HIP ARTHROPLASTY Right    Original surgery 2006 with revision 2010   TOTAL HIP REVISION Right 01/01/2016   Procedure: TOTAL HIP REVISION;  Surgeon: Durene Romans, MD;  Location: [facility];   Service: Orthopedics;  Laterality: Right;   TOTAL HIP REVISION Right 03/02/2016   Procedure: TOTAL HIP REVISION;  Surgeon: Durene Romans, MD;  Location: [facility];  Service: Orthopedics;  Laterality: Right;   TOTAL HIP REVISION Right 09/02/2016   Procedure: Right hip constrained liner- posterior;  Surgeon: Durene Romans, MD;  Location: [facility];  Service: Orthopedics;  Laterality: Right;   ULNAR NERVE TRANSPOSITION Right    UPPER ESOPHAGEAL ENDOSCOPIC ULTRASOUND (EUS) N/A 10/19/2020   Procedure: UPPER ESOPHAGEAL ENDOSCOPIC ULTRASOUND (EUS);  Surgeon: Rachael Fee, MD;  Location: [facility];  Service: Endoscopy;  Laterality: N/A;  periduodenal lesion    SOCIAL HISTORY: Social History   Socioeconomic History   Marital status: Married    Spouse name: Not on file   Number of children: 2   Years of education: Not on file   Highest education level: Not on file  Occupational History   Occupation: disabled   Occupation: disabled  Tobacco Use   Smoking status: Every Day    Current packs/day: 2.00    Average packs/day: 2.0 packs/day for 46.0 years (92.0 ttl pk-yrs)    Types: Cigarettes   Smokeless tobacco: Never   Tobacco comments:    2 packs of cigarettes smoked daily 12/13/21- declines smoking cessation  Vaping Use   Vaping status: Never Used  Substance and Sexual Activity   Alcohol use: No   Drug use: No   Sexual activity: Not Currently    Partners: Male  Other Topics Concern   Not on file  Social History Narrative   Right handed    Caffeine~ 2 cups per day    Lives at home with husband (strained relationship)   Primary caretaker for disabled brother who had aneurism   Daughter died 2018-07-06   Social Determinants of Health   Financial Resource Strain: Low Risk  (05/16/2022)   Overall Financial Resource Strain (CARDIA)    Difficulty of Paying Living Expenses: Not hard at all  Food Insecurity: No Food Insecurity (05/16/2022)   Hunger Vital Sign    Worried About Running Out of  Food in the Last Year: Never true    Ran Out of Food in the Last Year: Never true  Transportation Needs: No Transportation Needs (05/16/2022)   PRAPARE - Administrator, Civil Service (Medical): No    Lack of Transportation (Non-Medical): No  Physical Activity: Inactive (05/16/2022)   Exercise Vital Sign    Days of Exercise per Week: 0 days    Minutes of Exercise per Session: 0 min  Stress: Stress Concern Present (05/16/2022)   [employer] of Occupational Health - Occupational Stress Questionnaire    Feeling of Stress : To some extent  Social Connections: Moderately Isolated (05/16/2022)   Social Connection and Isolation Panel [NHANES]    Frequency of Communication with Friends and Family: More than three times a week    Frequency of Social Gatherings with Friends and Family: Once a week    Attends Religious Services: Never  Active Member of Clubs or Organizations: No    Attends Banker Meetings: Never    Marital Status: Married  Catering manager Violence: Not At Risk (05/16/2022)   Humiliation, Afraid, Rape, and Kick questionnaire    Fear of Current or Ex-Partner: No    Emotionally Abused: No    Physically Abused: No    Sexually Abused: No    FAMILY HISTORY: Family History  Problem Relation Age of Onset   COPD Mother    Heart disease Mother    Lung disease Father        Asbestosis   Heart attack Father    Heart disease Father    Cerebral aneurysm Brother    Aneurysm Brother        Brain   Drug abuse Daughter    Epilepsy Son    Alcohol abuse Son    Drug abuse Son    Arthritis Maternal Grandmother    Heart disease Maternal Grandmother    Asthma Maternal Grandfather    Cancer Maternal Grandfather    Arthritis Paternal Grandmother    Heart disease Paternal Grandmother    Stroke Paternal Grandmother    Early death Paternal Grandfather    Heart disease Paternal Grandfather     ALLERGIES:  is allergic to metformin and related, nsaids,  wellbutrin [bupropion], aleve [naproxen sodium], codeine, penicillins, and sulfonamide derivatives.  MEDICATIONS:  Current Outpatient Medications  Medication Sig Dispense Refill   topiramate (TOPAMAX) 25 MG tablet TAKE 1 TABLET BY MOUTH AT BEDTIME FOR 7 DAYS, THEN TAKE 2 TABLETS FOR 7 DAYS, THEN TAKE 3 TABLETS FOR 7 DAYS, THEN 4 TABLETS there AFTER, STOP increasing if migraines dramatically improve, FOR MIGRAINE prevention     albuterol (PROAIR HFA) 108 (90 Base) MCG/ACT inhaler 2 puffs every 4 hours as needed only  if your can't catch your breath 18 g 5   ALPRAZolam (XANAX) 1 MG tablet Take 1 tablet (1 mg total) by mouth 3 (three) times daily as needed. for anxiety 90 tablet 2   atorvastatin (LIPITOR) 20 MG tablet TAKE 1 TABLET BY MOUTH AT BEDTIME 90 tablet 1   benzonatate (TESSALON) 100 MG capsule Take 1 capsule (100 mg total) by mouth 3 (three) times daily. 90 capsule 1   calcipotriene (DOVONOX) 0.005 % cream Apply to affected area bid when on break from steroid cream 120 g 3   cyclobenzaprine (FLEXERIL) 10 MG tablet Take 10 mg by mouth 3 (three) times daily as needed.     dicyclomine (BENTYL) 10 MG capsule Take 1 capsule (10 mg total) by mouth every 6 (six) hours as needed for spasms. 90 capsule 3   diphenoxylate-atropine (LOMOTIL) 2.5-0.025 MG tablet Take 1 tablet by mouth 4 (four) times daily as needed for diarrhea or loose stools. 60 tablet 0   DULoxetine (CYMBALTA) 60 MG capsule TAKE TWO CAPSULES BY MOUTH EVERY DAY 180 capsule 1   FEROSUL 325 (65 Fe) MG tablet TAKE 1 TABLET BY MOUTH DAILY WITH BREAKFAST 30 tablet 3   fluconazole (DIFLUCAN) 150 MG tablet Take 1 tablet po once. May repeat dose in 3 days prn persistent symptoms. 3 tablet 1   fluticasone (FLONASE) 50 MCG/ACT nasal spray INSTILL 2 SPRAYS IN EACH NOSTRIL EVERY DAY 16 g 6   Fluticasone-Umeclidin-Vilant (TRELEGY ELLIPTA) 200-62.5-25 MCG/ACT AEPB Inhale 1 puff into the lungs daily at 2 PM. 60 each 5   furosemide (LASIX) 20 MG  tablet TAKE 1 TABLET BY MOUTH TWICE DAILY, IF SWELLING IMPROVES CAN HOLD  MEDICATION 90 tablet 0   HYDROcodone-acetaminophen (NORCO) 10-325 MG tablet Take 1 tablet by mouth every 4 (four) hours as needed. 150 tablet 0   hydrOXYzine (ATARAX) 10 MG tablet TAKE 1 TABLET BY MOUTH THREE TIMES DAILY AS NEEDED FOR ITCHING 30 tablet 2   levothyroxine (SYNTHROID) 137 MCG tablet Take 1 tablet (137 mcg total) by mouth daily before breakfast. 90 tablet 1   loratadine (CLARITIN) 10 MG tablet Take 10 mg by mouth daily.     meloxicam (MOBIC) 15 MG tablet TAKE 1 TABLET BY MOUTH EVERY DAY 30 tablet 2   nystatin (MYCOSTATIN) 100000 UNIT/ML suspension Swish and swallow with 5 ml up to QID prn for thrush 400 mL 0   OLANZapine (ZYPREXA) 10 MG tablet Take 1 tablet (10 mg total) by mouth at bedtime. 30 tablet 2   omeprazole (PRILOSEC) 40 MG capsule TAKE ONE CAPSULE BY MOUTH TWICE DAILY 180 capsule 3   ondansetron (ZOFRAN) 8 MG tablet Take 1 tablet (8 mg total) by mouth 2 (two) times daily. 60 tablet 1   Pancrelipase, Lip-Prot-Amyl, (CREON PO) Take by mouth.     permethrin (ELIMITE) 5 % cream      potassium chloride SA (KLOR-CON M) 20 MEQ tablet TAKE 1 TABLET BY MOUTH TWICE DAILY 60 tablet 2   prochlorperazine (COMPAZINE) 10 MG tablet Take 1 tablet (10 mg total) by mouth every 6 (six) hours as needed for nausea or vomiting. 30 tablet 0   Rimegepant Sulfate (NURTEC) 75 MG TBDP Take 1 tablet (75 mg total) by mouth daily as needed (for migraine-). Take within 15 minutes of initial symptoms 8 tablet 5   triamcinolone ointment (KENALOG) 0.5 % Apply 1 Application topically 3 (three) times daily. Apply thin-layer on affected areas, no more than 2 weeks consistently. 60 g 2   No current facility-administered medications for this visit.   Facility-Administered Medications Ordered in Other Visits  Medication Dose Route Frequency Provider Last Rate Last Admin   sodium chloride flush (NS) 0.9 % injection 10 mL  10 mL Intracatheter  PRN Jaci Standard, MD   10 mL at 10/23/22 1317    REVIEW OF SYSTEMS:   Constitutional: ( - ) fevers, ( - )  chills , ( - ) night sweats Eyes: ( - ) blurriness of vision, ( - ) double vision, ( - ) watery eyes Ears, nose, mouth, throat, and face: ( - ) mucositis, ( - ) sore throat Respiratory: ( - ) cough, ( -) dyspnea, ( - ) wheezes Cardiovascular: ( - ) palpitation, ( - ) chest discomfort, ( - ) lower extremity swelling Gastrointestinal:  ( +) nausea, ( - ) heartburn, ( - ) change in bowel habits Skin: ( - ) abnormal skin rashes Lymphatics: ( - ) new lymphadenopathy, ( - ) easy bruising Neurological: (+ ) numbness, ( - ) tingling, ( - ) new weaknesses Behavioral/Psych: ( - ) mood change, ( - ) new changes  All other systems were reviewed with the patient and are negative.  PHYSICAL EXAMINATION: ECOG PERFORMANCE STATUS: 1 - Symptomatic but completely ambulatory  Vitals:   10/23/22 1123  BP: (!) 144/99  Pulse: 96  Resp: 17  Temp: (!) 97.3 F (36.3 C)  SpO2: 90%   Filed Weights   10/23/22 1123  Weight: 176 lb 8 oz (80.1 kg)    GENERAL: Well-appearing middle-age Caucasian female, alert, no distress and comfortable SKIN: skin color, texture, turgor are normal, no rashes or significant lesions EYES:  conjunctiva are pink and non-injected, sclera clear LUNGS:  normal breathing effort. Diffuse wheezing hear on ausculation.  HEART: regular rate & rhythm and no murmurs. Mild bilateral lower extremity edema Musculoskeletal: no cyanosis of digits and no clubbing  PSYCH: alert & oriented x 3, fluent speech NEURO: no focal motor/sensory deficits  LABORATORY DATA:  I have reviewed the data as listed    Latest Ref Rng & Units 10/23/2022   10:30 AM 10/02/2022   10:36 AM 09/10/2022    1:18 PM  CBC  WBC 4.0 - 10.5 K/uL 9.9  9.1  6.5   Hemoglobin 12.0 - 15.0 g/dL 40.9  81.1  91.4   Hematocrit 36.0 - 46.0 % 45.5  44.2  39.6   Platelets 150 - 400 K/uL 208  167  175        Latest  Ref Rng & Units 10/23/2022   10:30 AM 10/02/2022   10:36 AM 09/10/2022    1:18 PM  CMP  Glucose 70 - 99 mg/dL 782  91  956   BUN 8 - 23 mg/dL 15  15  9    Creatinine 0.44 - 1.00 mg/dL 2.13  0.86  5.78   Sodium 135 - 145 mmol/L 137  139  139   Potassium 3.5 - 5.1 mmol/L 4.0  3.6  4.0   Chloride 98 - 111 mmol/L 104  102  110   CO2 22 - 32 mmol/L 25  27  25    Calcium 8.9 - 10.3 mg/dL 8.8  8.9  7.9   Total Protein 6.5 - 8.1 g/dL 6.3  6.5  5.5   Total Bilirubin 0.3 - 1.2 mg/dL 0.4  0.5  0.3   Alkaline Phos 38 - 126 U/L 74  72  71   AST 15 - 41 U/L 13  15  11    ALT 0 - 44 U/L 9  10  10      No results found for: "MPROTEIN" Lab Results  Component Value Date   KPAFRELGTCHN 0.75 06/30/2014   LAMBDASER 3.78 (H) 06/30/2014   KAPLAMBRATIO 0.20 (L) 06/30/2014     RADIOGRAPHIC STUDIES: No results found.  ASSESSMENT & PLAN TONE MILLAGE 67 y.o. female with medical history significant for extensive stage small cell lung cancer who presents for a follow up visit.   After review of the labs, review of the records, and discussion with the patient the patients findings are most consistent with extensive stage small cell lung cancer with metastasis from the right lower lobe to the lymph nodes of the abdomen.  At this time we will pursue triple therapy with carboplatin, etoposide, and atezolizumab.  After 4 cycles we will convert to maintenance atezolizumab alone.  We previously discussed the risks and benefits of this therapy and the patient was in agreement to proceed with this treatment.  The treatment of choice consist of carboplatin, etoposide, and atezolizumab.  The regimen consists of carboplatin AUC of 5 IV on day 1, etoposide 100 mg per metered squared IV on day 1, 2, and 3 and atezolizumab 1200 mg on day 1.  This continues for 21-day cycles.  After 4 cycles the patient proceeds with atezolizumab maintenance therapy alone.    # Small Cell Lung Cancer, Extensive Stage -- MRI of the brain shows  no evidence of intracranial spread --Findings are currently consistent with metastatic small cell lung cancer with metastatic spread to the lymph nodes of the abdomen -- Started carboplatin, etoposide, and atezolizumab on 11/13/2020. Transitioned to maintenance  Atezolizumab on 02/22/2021.  -- Most recent CT CAP from 04/26/2022 which shows no evidence of recurrence.  Plan: --today is Cycle 31 Day 1 of Atezolizumab maintenance.  --labs today were reviewed and adequate for treatment. Hgb 15.1, white blood cell count 9.9, MCV 92.3, and platelets of 208 --patient is willing and able to proceed with treatment at this time.  --CT scan to be collected every 3 months time.  Last scan on 07/26/2022 showed no evidence of residual recurrent disease.  Repeat scan due late Sept 2024 --RTC in 3 weeks for a follow up before Cycle 32  # Early Cirrhosis --noted on MR abdomen from 05/22/2022. [place] with elastography performed on 06/18/2022. --continue to follow with GI.   #Right renal lesion: --Noted to be benign on most recent MRI of the abdomen.  #Diarrhea-improved --stool sent for C. Diff and GI pathology panel, all negative --patient following with GI and found to have pancreatic insufficiency and started on creon with improvement of symptoms.   #Nausea: --Symptoms improved with zofran and olanzapine.  --Added IV zofran to infusion premeds for better symptom control.   #Hypokalemia:  --currently taking potassium chloride 20 meq BID --potassium level is still 3.6 today. Continue PO supplementation.   #Lower extremity edema --Currently takes lasix 20 mg 1-2 times a day. --Stable. Continue to monitor.   #Neuropathy involving fingers/feet: --Patient currently takes Cymbalta   #Supportive Care -- chemotherapy education complete -- port placed -- zofran 8mg  q8H PRN and compazine 10mg  PO q6H for nausea -- EMLA cream for port  No orders of the defined types were placed in this encounter.  All questions  were answered. The patient knows to call the clinic with any problems, questions or concerns.  I have spent a total of 30 minutes minutes of face-to-face and non-face-to-face time, preparing to see the patient, performing a medically appropriate examination, counseling and educating the patient, ordering medications/tests, documenting clinical information in the electronic health record, and care coordination.   Ulysees Barns, MD Department of Hematology/Oncology [facility] Phone: [phone] Pager: [phone] Email: [email or web address]  10/23/2022 2:18 PM

## 2022-10-23 NOTE — Progress Notes (Signed)
Nutrition Follow-up:  Patient with extensive stage small cell lung cancer. She is receiving maintenance therapy with atezolizumab q21d.   Met with pt in infusion. She reports recently having 8 of her bottom teeth extracted. Patient has been unable to tolerate regular foods in the last 2 weeks. She recalls eating mostly creamed potatoes and gravy. She has had some soup and a few bites of meatloaf. Patient does not like Ensure/Boost. She is lactose intolerant. Patient reports cold foods are make her gums hurt. Patient is planning to have a bowl of soup when she gets home. She has not eaten today. Patient politely declines RD offer of soup/yogurt.   Medications: reviewed   Labs: glucose 101, Cr 1.32  Anthropometrics: Wt 176 lb 8 oz today decreased 7% (13 lbs) in the last 6 weeks - this is severe for time frame  9/4 - 179 lb 14.4 oz 8/13 - 189 lb 9.6 oz  7/10 - 183 lb 3.2 oz   NUTRITION DIAGNOSIS: Inadequate oral intake continues   INTERVENTION:  Encouraged soft smooth textures high in calories and protein to support healing  Encouraged bites/sips q2-3h vs eating once/day Support and encouragement     MONITORING, EVALUATION, GOAL: wt trends, intake   NEXT VISIT: Wednesday November 6 during infusion

## 2022-10-31 ENCOUNTER — Other Ambulatory Visit: Payer: Self-pay | Admitting: Physician Assistant

## 2022-10-31 ENCOUNTER — Other Ambulatory Visit: Payer: Self-pay | Admitting: Physical Medicine and Rehabilitation

## 2022-10-31 ENCOUNTER — Telehealth: Payer: Self-pay | Admitting: *Deleted

## 2022-10-31 NOTE — Telephone Encounter (Signed)
-----   Message from Ulysees Barns IV sent at 10/28/2022 12:32 PM EDT ----- Please let Mrs. Croker know that her CT scan showed no evidence of residual or recurrent disease. ----- Message ----- From: Interface, Rad Results In Sent: 10/28/2022  12:17 PM EDT To: Jaci Standard, MD

## 2022-10-31 NOTE — Telephone Encounter (Signed)
TCT patient regarding recent scan results. Spoke with patient. Advised that her CT scan showed no evidence of residual or recurrent disease.  Pt pleased with result and is good with continuing her current treatment plan. She is aware of her appts later this month.

## 2022-11-13 ENCOUNTER — Inpatient Hospital Stay: Payer: 59

## 2022-11-13 ENCOUNTER — Inpatient Hospital Stay: Payer: 59 | Attending: Hematology and Oncology

## 2022-11-13 ENCOUNTER — Other Ambulatory Visit: Payer: 59

## 2022-11-13 ENCOUNTER — Inpatient Hospital Stay: Payer: 59 | Attending: Hematology and Oncology | Admitting: Physician Assistant

## 2022-11-13 ENCOUNTER — Other Ambulatory Visit: Payer: Self-pay

## 2022-11-13 ENCOUNTER — Other Ambulatory Visit: Payer: Self-pay | Admitting: *Deleted

## 2022-11-13 VITALS — BP 142/82 | HR 100 | Temp 98.0°F | Resp 18 | Ht 62.0 in | Wt 175.6 lb

## 2022-11-13 VITALS — BP 123/77 | HR 92 | Temp 98.0°F | Resp 18

## 2022-11-13 DIAGNOSIS — Z885 Allergy status to narcotic agent status: Secondary | ICD-10-CM | POA: Diagnosis not present

## 2022-11-13 DIAGNOSIS — Z886 Allergy status to analgesic agent status: Secondary | ICD-10-CM | POA: Insufficient documentation

## 2022-11-13 DIAGNOSIS — Z888 Allergy status to other drugs, medicaments and biological substances status: Secondary | ICD-10-CM | POA: Insufficient documentation

## 2022-11-13 DIAGNOSIS — R300 Dysuria: Secondary | ICD-10-CM

## 2022-11-13 DIAGNOSIS — F17210 Nicotine dependence, cigarettes, uncomplicated: Secondary | ICD-10-CM | POA: Insufficient documentation

## 2022-11-13 DIAGNOSIS — R110 Nausea: Secondary | ICD-10-CM | POA: Diagnosis not present

## 2022-11-13 DIAGNOSIS — Z5112 Encounter for antineoplastic immunotherapy: Secondary | ICD-10-CM | POA: Diagnosis not present

## 2022-11-13 DIAGNOSIS — R5383 Other fatigue: Secondary | ICD-10-CM | POA: Insufficient documentation

## 2022-11-13 DIAGNOSIS — J432 Centrilobular emphysema: Secondary | ICD-10-CM | POA: Diagnosis not present

## 2022-11-13 DIAGNOSIS — C3431 Malignant neoplasm of lower lobe, right bronchus or lung: Secondary | ICD-10-CM | POA: Diagnosis not present

## 2022-11-13 DIAGNOSIS — Z9049 Acquired absence of other specified parts of digestive tract: Secondary | ICD-10-CM | POA: Insufficient documentation

## 2022-11-13 DIAGNOSIS — Z814 Family history of other substance abuse and dependence: Secondary | ICD-10-CM | POA: Insufficient documentation

## 2022-11-13 DIAGNOSIS — Z8719 Personal history of other diseases of the digestive system: Secondary | ICD-10-CM | POA: Diagnosis not present

## 2022-11-13 DIAGNOSIS — I700 Atherosclerosis of aorta: Secondary | ICD-10-CM | POA: Diagnosis not present

## 2022-11-13 DIAGNOSIS — Z823 Family history of stroke: Secondary | ICD-10-CM | POA: Insufficient documentation

## 2022-11-13 DIAGNOSIS — Z880 Allergy status to penicillin: Secondary | ICD-10-CM | POA: Diagnosis not present

## 2022-11-13 DIAGNOSIS — Z79620 Long term (current) use of immunosuppressive biologic: Secondary | ICD-10-CM | POA: Diagnosis not present

## 2022-11-13 DIAGNOSIS — D3502 Benign neoplasm of left adrenal gland: Secondary | ICD-10-CM | POA: Insufficient documentation

## 2022-11-13 DIAGNOSIS — M51369 Other intervertebral disc degeneration, lumbar region without mention of lumbar back pain or lower extremity pain: Secondary | ICD-10-CM | POA: Insufficient documentation

## 2022-11-13 DIAGNOSIS — E119 Type 2 diabetes mellitus without complications: Secondary | ICD-10-CM | POA: Insufficient documentation

## 2022-11-13 DIAGNOSIS — K219 Gastro-esophageal reflux disease without esophagitis: Secondary | ICD-10-CM | POA: Insufficient documentation

## 2022-11-13 DIAGNOSIS — Z79890 Hormone replacement therapy (postmenopausal): Secondary | ICD-10-CM | POA: Insufficient documentation

## 2022-11-13 DIAGNOSIS — Z882 Allergy status to sulfonamides status: Secondary | ICD-10-CM | POA: Diagnosis not present

## 2022-11-13 DIAGNOSIS — Z809 Family history of malignant neoplasm, unspecified: Secondary | ICD-10-CM | POA: Insufficient documentation

## 2022-11-13 DIAGNOSIS — E785 Hyperlipidemia, unspecified: Secondary | ICD-10-CM | POA: Diagnosis not present

## 2022-11-13 DIAGNOSIS — Z8249 Family history of ischemic heart disease and other diseases of the circulatory system: Secondary | ICD-10-CM | POA: Insufficient documentation

## 2022-11-13 DIAGNOSIS — Z825 Family history of asthma and other chronic lower respiratory diseases: Secondary | ICD-10-CM | POA: Insufficient documentation

## 2022-11-13 DIAGNOSIS — Z90710 Acquired absence of both cervix and uterus: Secondary | ICD-10-CM | POA: Insufficient documentation

## 2022-11-13 DIAGNOSIS — M47816 Spondylosis without myelopathy or radiculopathy, lumbar region: Secondary | ICD-10-CM | POA: Insufficient documentation

## 2022-11-13 DIAGNOSIS — Z79899 Other long term (current) drug therapy: Secondary | ICD-10-CM | POA: Insufficient documentation

## 2022-11-13 DIAGNOSIS — Z95828 Presence of other vascular implants and grafts: Secondary | ICD-10-CM

## 2022-11-13 DIAGNOSIS — I10 Essential (primary) hypertension: Secondary | ICD-10-CM | POA: Diagnosis not present

## 2022-11-13 DIAGNOSIS — K8689 Other specified diseases of pancreas: Secondary | ICD-10-CM | POA: Insufficient documentation

## 2022-11-13 DIAGNOSIS — Z820 Family history of epilepsy and other diseases of the nervous system: Secondary | ICD-10-CM | POA: Insufficient documentation

## 2022-11-13 DIAGNOSIS — K5730 Diverticulosis of large intestine without perforation or abscess without bleeding: Secondary | ICD-10-CM | POA: Insufficient documentation

## 2022-11-13 DIAGNOSIS — Z8261 Family history of arthritis: Secondary | ICD-10-CM | POA: Insufficient documentation

## 2022-11-13 DIAGNOSIS — Z90721 Acquired absence of ovaries, unilateral: Secondary | ICD-10-CM | POA: Insufficient documentation

## 2022-11-13 LAB — CMP (CANCER CENTER ONLY)
ALT: 8 U/L (ref 0–44)
AST: 12 U/L — ABNORMAL LOW (ref 15–41)
Albumin: 3.5 g/dL (ref 3.5–5.0)
Alkaline Phosphatase: 69 U/L (ref 38–126)
Anion gap: 10 (ref 5–15)
BUN: 12 mg/dL (ref 8–23)
CO2: 26 mmol/L (ref 22–32)
Calcium: 8.8 mg/dL — ABNORMAL LOW (ref 8.9–10.3)
Chloride: 102 mmol/L (ref 98–111)
Creatinine: 1.24 mg/dL — ABNORMAL HIGH (ref 0.44–1.00)
GFR, Estimated: 48 mL/min — ABNORMAL LOW (ref >60)
Glucose, Bld: 117 mg/dL — ABNORMAL HIGH (ref 70–99)
Potassium: 3.3 mmol/L — ABNORMAL LOW (ref 3.5–5.1)
Sodium: 138 mmol/L (ref 135–145)
Total Bilirubin: 0.3 mg/dL (ref 0.3–1.2)
Total Protein: 6.3 g/dL — ABNORMAL LOW (ref 6.5–8.1)

## 2022-11-13 LAB — CBC WITH DIFFERENTIAL (CANCER CENTER ONLY)
Abs Immature Granulocytes: 0.05 10*3/uL (ref 0.00–0.07)
Basophils Absolute: 0.1 10*3/uL (ref 0.0–0.1)
Basophils Relative: 1 %
Eosinophils Absolute: 0.7 10*3/uL — ABNORMAL HIGH (ref 0.0–0.5)
Eosinophils Relative: 8 %
HCT: 44.6 % (ref 36.0–46.0)
Hemoglobin: 14.8 g/dL (ref 12.0–15.0)
Immature Granulocytes: 1 %
Lymphocytes Relative: 33 %
Lymphs Abs: 3 10*3/uL (ref 0.7–4.0)
MCH: 30.8 pg (ref 26.0–34.0)
MCHC: 33.2 g/dL (ref 30.0–36.0)
MCV: 92.9 fL (ref 80.0–100.0)
Monocytes Absolute: 0.6 10*3/uL (ref 0.1–1.0)
Monocytes Relative: 6 %
Neutro Abs: 4.7 10*3/uL (ref 1.7–7.7)
Neutrophils Relative %: 51 %
Platelet Count: 171 10*3/uL (ref 150–400)
RBC: 4.8 MIL/uL (ref 3.87–5.11)
RDW: 15.5 % (ref 11.5–15.5)
WBC Count: 9.1 10*3/uL (ref 4.0–10.5)
nRBC: 0 % (ref 0.0–0.2)

## 2022-11-13 LAB — URINALYSIS, COMPLETE (UACMP) WITH MICROSCOPIC
Bilirubin Urine: NEGATIVE
Glucose, UA: NEGATIVE mg/dL
Hgb urine dipstick: NEGATIVE
Ketones, ur: 5 mg/dL — AB
Nitrite: NEGATIVE
Protein, ur: NEGATIVE mg/dL
Specific Gravity, Urine: 1.024 (ref 1.005–1.030)
WBC, UA: >50 WBC/hpf (ref 0–5)
pH: 5 (ref 5.0–8.0)

## 2022-11-13 LAB — IRON AND IRON BINDING CAPACITY (CC-WL,HP ONLY)
Iron: 79 ug/dL (ref 28–170)
Saturation Ratios: 26 % (ref 10.4–31.8)
TIBC: 301 ug/dL (ref 250–450)
UIBC: 222 ug/dL

## 2022-11-13 LAB — TSH: TSH: 0.327 u[IU]/mL — ABNORMAL LOW (ref 0.350–4.500)

## 2022-11-13 LAB — FERRITIN: Ferritin: 39 ng/mL (ref 11–307)

## 2022-11-13 MED ORDER — ONDANSETRON HCL 4 MG/2ML IJ SOLN
8.0000 mg | Freq: Once | INTRAMUSCULAR | Status: AC
  Administered 2022-11-13: 8 mg via INTRAVENOUS
  Filled 2022-11-13: qty 4

## 2022-11-13 MED ORDER — SODIUM CHLORIDE 0.9 % IV SOLN
1200.0000 mg | Freq: Once | INTRAVENOUS | Status: AC
  Administered 2022-11-13: 1200 mg via INTRAVENOUS
  Filled 2022-11-13: qty 20

## 2022-11-13 MED ORDER — HEPARIN SOD (PORK) LOCK FLUSH 100 UNIT/ML IV SOLN
500.0000 [IU] | Freq: Once | INTRAVENOUS | Status: AC | PRN
  Administered 2022-11-13: 500 [IU]

## 2022-11-13 MED ORDER — SODIUM CHLORIDE 0.9 % IV SOLN: Freq: Once | INTRAVENOUS | Status: AC

## 2022-11-13 MED ORDER — CIPROFLOXACIN HCL 250 MG PO TABS: 500.0000 mg | ORAL_TABLET | Freq: Two times a day (BID) | ORAL | 0 refills | Status: AC

## 2022-11-13 MED ORDER — SODIUM CHLORIDE 0.9% FLUSH
10.0000 mL | INTRAVENOUS | Status: DC | PRN
  Administered 2022-11-13: 10 mL

## 2022-11-13 MED ORDER — SODIUM CHLORIDE 0.9% FLUSH
10.0000 mL | Freq: Once | INTRAVENOUS | Status: AC
  Administered 2022-11-13: 10 mL

## 2022-11-13 NOTE — Patient Instructions (Signed)
[facility]  Discharge Instructions: Thank you for choosing [facility] to provide your oncology and hematology care.   If you have a lab appointment with the Cancer Center, please go directly to the Cancer Center and check in at the registration area.   Wear comfortable clothing and clothing appropriate for easy access to any Portacath or PICC line.   We strive to give you quality time with your provider. You may need to reschedule your appointment if you arrive late (15 or more minutes).  Arriving late affects you and other patients whose appointments are after yours.  Also, if you miss three or more appointments without notifying the office, you may be dismissed from the clinic at the provider's discretion.      For prescription refill requests, have your pharmacy contact our office and allow 72 hours for refills to be completed.    Today you received the following chemotherapy and/or immunotherapy agents Tecentriq      To help prevent nausea and vomiting after your treatment, we encourage you to take your nausea medication as directed.  BELOW ARE SYMPTOMS THAT SHOULD BE REPORTED IMMEDIATELY: *FEVER GREATER THAN 100.4 F (38 C) OR HIGHER *CHILLS OR SWEATING *NAUSEA AND VOMITING THAT IS NOT CONTROLLED WITH YOUR NAUSEA MEDICATION *UNUSUAL SHORTNESS OF BREATH *UNUSUAL BRUISING OR BLEEDING *URINARY PROBLEMS (pain or burning when urinating, or frequent urination) *BOWEL PROBLEMS (unusual diarrhea, constipation, pain near the anus) TENDERNESS IN MOUTH AND THROAT WITH OR WITHOUT PRESENCE OF ULCERS (sore throat, sores in mouth, or a toothache) UNUSUAL RASH, SWELLING OR PAIN  UNUSUAL VAGINAL DISCHARGE OR ITCHING   Items with * indicate a potential emergency and should be followed up as soon as possible or go to the Emergency Department if any problems should occur.  Please show the CHEMOTHERAPY ALERT CARD or IMMUNOTHERAPY ALERT CARD at  check-in to the Emergency Department and triage nurse.  Should you have questions after your visit or need to cancel or reschedule your appointment, please contact [facility]  Dept: [phone]  and follow the prompts.  Office hours are 8:00 a.m. to 4:30 p.m. Monday - Friday. Please note that voicemails left after 4:00 p.m. may not be returned until the following business day.  We are closed weekends and major holidays. You have access to a nurse at all times for urgent questions. Please call the main number to the clinic Dept: [phone] and follow the prompts.   For any non-urgent questions, you may also contact your provider using MyChart. We now offer e-Visits for anyone 80 and older to request care online for non-urgent symptoms. For details visit [email or web address].   Also download the MyChart app! Go to the app store, search "MyChart", open the app, select , and log in with your MyChart username and password.

## 2022-11-13 NOTE — Progress Notes (Signed)
[name] [phone]   [phone]  PROGRESS NOTE  Patient Care Team: Allwardt, Crist Infante, PA-C as PCP - General (Physician Assistant) O'Neal, Ronnald Ramp, MD as PCP - Cardiology (Cardiology) Rachael Fee, MD as Attending Physician (Gastroenterology) Dr. Louie Bun, MD as Consulting Physician (Orthopedic Surgery) Ranee Gosselin, MD as Consulting Physician (Orthopedic Surgery) York Spaniel, MD (Inactive) as Consulting Physician (Neurology) Smitty Cords, OD as Consulting Physician (Optometry) Karel Jarvis Lesle Chris, MD as Consulting Physician (Neurology) Erroll Luna, [facility] (Inactive) (Pharmacist)  Hematological/Oncological History # Small Cell Lung Cancer, Extensive Stage 07/05/2020: CT abdomen for lower abdominal pain. New pulmonary nodular density noted 07/06/2020: CT chest showed 2.2 cm macrolobulated right lower lobe pulmonary nodule (favored) versus pathologically enlarged infrahilar lymph node 10/13/2020: PET CT scan performed, findings show 2 cm right lower lobe lung mass is hypermetabolic and consistent with primary lung neoplasm. Additionally found hypermetabolic 17 mm soft tissue lesion between the descending duodenum and the pancreatic head  10/19/2020: EGD to biopsy hypermetabolic lymph node. Biopsy results show small cell lung cancer 10/26/2020: establish care with Dr. Leonides Schanz  11/13/2020: Cycle 1 Day 1 of Carbo/Etop/Atezolizumab 12/04/2020: Cycle 2 Day 1 of Carbo/Etop/Atezolizumab 11/22-11/25/2022: admitted for E. Coli bacteremia/sepsis. Start of Cycle 3 delayed. 01/02/2021: Cycle 3 Day 1 of Carbo/Etop/Atezolizumab 01/23/2021: Cycle 4 Day 1 of Carbo/Etop/Atezolizumab 02/22/2021: Cycle 5 Day 1 of Atezolizumab Maintenance. Delayed start due to patient's COVID infection.  03/14/2021: Cycle 6 Day 1 of Atezolizumab Maintenance 04/05/2021: Cycle 7 Day 1 of Atezolizumab Maintenance 04/25/2021: Cycle 8 Day 1 of Atezolizumab  Maintenance 05/16/2021: Cycle 9 Day 1 of Atezolizumab Maintenance 06/06/2021: Cycle 10 Day 1 of Atezolizumab Maintenance 06/27/2021:  Cycle 11 Day 1 of Atezolizumab Maintenance 07/18/2021: Cycle 12 Day 1 of Atezolizumab Maintenance 08/08/2021: Cycle 13 Day 1 of Atezolizumab Maintenance 08/29/2021: Cycle 14 Day 1 of Atezolizumab Maintenance 09/19/2021: Cycle 15 Day 1 of Atezolizumab Maintenance 10/10/2021: Cycle 16 Day 1 of Atezolizumab Maintenance 10/31/2021: Cycle 17 Day 1 of Atezolizumab Maintenance 11/21/2021: Cycle 18 Day 1 of Atezolizumab Maintenance 12/12/2021: Cycle 19 Day 1 of Atezolizumab Maintenance 01/02/2022: Cycle 20 Day 1 of Atezolizumab Maintenance 01/23/2022: Cycle 21 Day 1 of Atezolizumab Maintenance 02/13/2022: treatment HELD due to diarrhea.  03/06/2022: Cycle 22 Day 1 of Atezolizumab Maintenance 03/27/2022: Cycle 23 Day 1 of Atezolizumab Maintenance 04/17/2022: Cycle 24 Day 1 of Atezolizumab Maintenance 05/08/2022:  Cycle 25 Day 1 of Atezolizumab Maintenance (Held due to patient preference for treatment holiday) 05/29/2022: Cycle 25 Day 1 of Atezolizumab Maintenance  06/19/2022: Cycle 26 Day 1 of Atezolizumab Maintenance  07/12/2022: Cycle 27 Day 1 of Atezolizumab Maintenance 07/31/2022: Cycle 28 Day 1 of Atezolizumab Maintenance HELD per patient request for fatigue.  08/22/2022: Cycle 28 Day 1 of Atezolizumab Maintenance 09/11/2022: Cycle 29 Day 1 of Atezolizumab Maintenance 10/03/2022: Cycle 30 Day 1 of Atezolizumab Maintenance 10/23/2022: Cycle 31 Day 1 of Atezolizumab Maintenance 11/13/2022: Cycle 32 Day 1 of Atezolizumab Maintenance  Interval History:  [name] 67 y.o. female with medical history significant for extensive stage small cell lung cancer who presents for a follow up visit. The patient's last visit was on 10/23/2022.  She presents today to start cycle 32 of chemotherapy.   On exam today [name] reports energy levels are overall stable. She does have fatigue but  is able to complete her ADLs on her own. She reports occasional nausea that is well controlled with antiemetics as needed. She denies any vomiting episodes. She reports  occasional episodes of diarrhea versus constipation. She denies easy bruising or signs of bleeding. She reports burning with urination with increased frequency. In addition, her urine is dark in color.  She reports that otherwise she remains at her baseline level of health and is willing and able to proceed with atezolizumab therapy at this time.  She denies fevers, chills, sweats, chest pain or cough. She has no other complaints.A full 10 point ROS is listed below.   MEDICAL HISTORY:  Past Medical History:  Diagnosis Date   Allergic rhinitis    Anemia    Anxiety    Chicken pox    Chronic back pain    COPD (chronic obstructive pulmonary disease) (HCC)    Depression    DM (diabetes mellitus) (HCC)    Essential hypertension    GERD (gastroesophageal reflux disease)    Headache    migraines   History of gastritis    EGD 2015   History of home oxygen therapy    2 liters at hs last 6 months   Hyperlipidemia    Hypothyroidism    Migraines    Osteoarthritis    oa   Scoliosis    Small cell lung cancer (HCC)    Stage 4    SURGICAL HISTORY: Past Surgical History:  Procedure Laterality Date   APPENDECTOMY     1985   BIOPSY  07/24/2017   Procedure: BIOPSY;  Surgeon: Rachael Fee, MD;  Location: [facility];  Service: Endoscopy;;   CARDIAC CATHETERIZATION N/A 10/31/2015   Procedure: Left Heart Cath and Coronary Angiography;  Surgeon: Marykay Lex, MD;  Location: [facility] INVASIVE CV LAB;  Service: Cardiovascular;  Laterality: N/A;   CARPAL TUNNEL RELEASE Left    CARPAL TUNNEL RELEASE Right    CHOLECYSTECTOMY  late 1980's   COLONOSCOPY WITH PROPOFOL N/A 07/24/2017   Procedure: COLONOSCOPY WITH PROPOFOL;  Surgeon: Rachael Fee, MD;  Location: [facility];  Service: Endoscopy;  Laterality: N/A;    ESOPHAGOGASTRODUODENOSCOPY N/A 07/24/2017   Procedure: ESOPHAGOGASTRODUODENOSCOPY (EGD);  Surgeon: Rachael Fee, MD;  Location: [facility];  Service: Endoscopy;  Laterality: N/A;   ESOPHAGOGASTRODUODENOSCOPY (EGD) WITH PROPOFOL N/A 10/19/2020   Procedure: ESOPHAGOGASTRODUODENOSCOPY (EGD) WITH PROPOFOL;  Surgeon: Rachael Fee, MD;  Location: [facility];  Service: Endoscopy;  Laterality: N/A;   FINE NEEDLE ASPIRATION N/A 10/19/2020   Procedure: FINE NEEDLE ASPIRATION (FNA) LINEAR;  Surgeon: Rachael Fee, MD;  Location: [facility];  Service: Endoscopy;  Laterality: N/A;   GALLBLADDER SURGERY  1991   HIP CLOSED REDUCTION Right 01/08/2016   Procedure: CLOSED MANIPULATION HIP;  Surgeon: Jene Every, MD;  Location: [facility];  Service: Orthopedics;  Laterality: Right;   HIP CLOSED REDUCTION Right 01/19/2016   Procedure: ATTEMPTED CLOSED REDUCTION RIGHT HIP;  Surgeon: Toni Arthurs, MD;  Location: [facility];  Service: Orthopedics;  Laterality: Right;   HIP CLOSED REDUCTION Right 01/20/2016   Procedure: CLOSED REDUCTION RIGHT TOTAL HIP;  Surgeon: Durene Romans, MD;  Location: [facility];  Service: Orthopedics;  Laterality: Right;   HIP CLOSED REDUCTION Right 02/17/2016   Procedure: CLOSED REDUCTION RIGHT TOTAL HIP;  Surgeon: Samson Frederic, MD;  Location: [facility];  Service: Orthopedics;  Laterality: Right;   HIP CLOSED REDUCTION Right 02/28/2016   Procedure: CLOSED REDUCTION HIP;  Surgeon: Yolonda Kida, MD;  Location: [facility];  Service: Orthopedics;  Laterality: Right;   IR IMAGING GUIDED PORT INSERTION  11/01/2020   POLYPECTOMY  07/24/2017   Procedure: POLYPECTOMY;  Surgeon:  Rachael Fee, MD;  Location: [facility];  Service: Endoscopy;;   TONSILLECTOMY     TOTAL ABDOMINAL HYSTERECTOMY     1985, with 1 ovary removed and 2 nd ovary removed 2003   TOTAL HIP ARTHROPLASTY Right    Original surgery 2006 with revision 2010   TOTAL HIP REVISION Right 01/01/2016   Procedure: TOTAL HIP REVISION;   Surgeon: Durene Romans, MD;  Location: [facility];  Service: Orthopedics;  Laterality: Right;   TOTAL HIP REVISION Right 03/02/2016   Procedure: TOTAL HIP REVISION;  Surgeon: Durene Romans, MD;  Location: [facility];  Service: Orthopedics;  Laterality: Right;   TOTAL HIP REVISION Right 09/02/2016   Procedure: Right hip constrained liner- posterior;  Surgeon: Durene Romans, MD;  Location: [facility];  Service: Orthopedics;  Laterality: Right;   ULNAR NERVE TRANSPOSITION Right    UPPER ESOPHAGEAL ENDOSCOPIC ULTRASOUND (EUS) N/A 10/19/2020   Procedure: UPPER ESOPHAGEAL ENDOSCOPIC ULTRASOUND (EUS);  Surgeon: Rachael Fee, MD;  Location: [facility];  Service: Endoscopy;  Laterality: N/A;  periduodenal lesion    SOCIAL HISTORY: Social History   Socioeconomic History   Marital status: Married    Spouse name: Not on file   Number of children: 2   Years of education: Not on file   Highest education level: Not on file  Occupational History   Occupation: disabled   Occupation: disabled  Tobacco Use   Smoking status: Every Day    Current packs/day: 2.00    Average packs/day: 2.0 packs/day for 46.0 years (92.0 ttl pk-yrs)    Types: Cigarettes   Smokeless tobacco: Never   Tobacco comments:    2 packs of cigarettes smoked daily 12/13/21- declines smoking cessation  Vaping Use   Vaping status: Never Used  Substance and Sexual Activity   Alcohol use: No   Drug use: No   Sexual activity: Not Currently    Partners: Male  Other Topics Concern   Not on file  Social History Narrative   Right handed    Caffeine~ 2 cups per day    Lives at home with husband (strained relationship)   Primary caretaker for disabled brother who had aneurism   Daughter died 07-16-18   Social Determinants of Health   Financial Resource Strain: Low Risk  (05/16/2022)   Overall Financial Resource Strain (CARDIA)    Difficulty of Paying Living Expenses: Not hard at all  Food Insecurity: No Food Insecurity (05/16/2022)    Hunger Vital Sign    Worried About Running Out of Food in the Last Year: Never true    Ran Out of Food in the Last Year: Never true  Transportation Needs: No Transportation Needs (05/16/2022)   PRAPARE - Administrator, Civil Service (Medical): No    Lack of Transportation (Non-Medical): No  Physical Activity: Inactive (05/16/2022)   Exercise Vital Sign    Days of Exercise per Week: 0 days    Minutes of Exercise per Session: 0 min  Stress: Stress Concern Present (05/16/2022)   [employer] of Occupational Health - Occupational Stress Questionnaire    Feeling of Stress : To some extent  Social Connections: Moderately Isolated (05/16/2022)   Social Connection and Isolation Panel [NHANES]    Frequency of Communication with Friends and Family: More than three times a week    Frequency of Social Gatherings with Friends and Family: Once a week    Attends Religious Services: Never    Database administrator or Organizations:  No    Attends Club or Organization Meetings: Never    Marital Status: Married  Catering manager Violence: Not At Risk (05/16/2022)   Humiliation, Afraid, Rape, and Kick questionnaire    Fear of Current or Ex-Partner: No    Emotionally Abused: No    Physically Abused: No    Sexually Abused: No    FAMILY HISTORY: Family History  Problem Relation Age of Onset   COPD Mother    Heart disease Mother    Lung disease Father        Asbestosis   Heart attack Father    Heart disease Father    Cerebral aneurysm Brother    Aneurysm Brother        Brain   Drug abuse Daughter    Epilepsy Son    Alcohol abuse Son    Drug abuse Son    Arthritis Maternal Grandmother    Heart disease Maternal Grandmother    Asthma Maternal Grandfather    Cancer Maternal Grandfather    Arthritis Paternal Grandmother    Heart disease Paternal Grandmother    Stroke Paternal Grandmother    Early death Paternal Grandfather    Heart disease Paternal Grandfather     ALLERGIES:   is allergic to metformin and related, nsaids, wellbutrin [bupropion], aleve [naproxen sodium], codeine, penicillins, and sulfonamide derivatives.  MEDICATIONS:  Current Outpatient Medications  Medication Sig Dispense Refill   albuterol (PROAIR HFA) 108 (90 Base) MCG/ACT inhaler 2 puffs every 4 hours as needed only  if your can't catch your breath 18 g 5   ALPRAZolam (XANAX) 1 MG tablet Take 1 tablet (1 mg total) by mouth 3 (three) times daily as needed. for anxiety 90 tablet 2   atorvastatin (LIPITOR) 20 MG tablet TAKE 1 TABLET BY MOUTH AT BEDTIME 90 tablet 1   benzonatate (TESSALON) 100 MG capsule Take 1 capsule (100 mg total) by mouth 3 (three) times daily. 90 capsule 1   calcipotriene (DOVONOX) 0.005 % cream Apply to affected area bid when on break from steroid cream 120 g 3   ciprofloxacin (CIPRO) 250 MG tablet Take 2 tablets (500 mg total) by mouth 2 (two) times daily for 5 days. 20 tablet 0   cyclobenzaprine (FLEXERIL) 10 MG tablet Take 10 mg by mouth 3 (three) times daily as needed.     dicyclomine (BENTYL) 10 MG capsule Take 1 capsule (10 mg total) by mouth every 6 (six) hours as needed for spasms. 90 capsule 3   diphenoxylate-atropine (LOMOTIL) 2.5-0.025 MG tablet Take 1 tablet by mouth 4 (four) times daily as needed for diarrhea or loose stools. 60 tablet 0   DULoxetine (CYMBALTA) 60 MG capsule TAKE TWO CAPSULES BY MOUTH EVERY DAY 180 capsule 1   FEROSUL 325 (65 Fe) MG tablet TAKE 1 TABLET BY MOUTH DAILY WITH BREAKFAST 30 tablet 3   fluconazole (DIFLUCAN) 150 MG tablet Take 1 tablet po once. May repeat dose in 3 days prn persistent symptoms. 3 tablet 1   fluticasone (FLONASE) 50 MCG/ACT nasal spray INSTILL 2 SPRAYS IN EACH NOSTRIL EVERY DAY 16 g 6   Fluticasone-Umeclidin-Vilant (TRELEGY ELLIPTA) 200-62.5-25 MCG/ACT AEPB Inhale 1 puff into the lungs daily at 2 PM. 60 each 5   furosemide (LASIX) 20 MG tablet TAKE 1 TABLET BY MOUTH TWICE DAILY, IF SWELLING IMPROVES CAN HOLD MEDICATION 90  tablet 0   HYDROcodone-acetaminophen (NORCO) 10-325 MG tablet TAKE 1 TABLET BY MOUTH EVERY 4 HOURS AS NEEDED 150 tablet 0   hydrOXYzine (ATARAX)  10 MG tablet TAKE 1 TABLET BY MOUTH THREE TIMES DAILY AS NEEDED FOR ITCHING 30 tablet 2   levothyroxine (SYNTHROID) 137 MCG tablet Take 1 tablet (137 mcg total) by mouth daily before breakfast. 90 tablet 1   loratadine (CLARITIN) 10 MG tablet Take 10 mg by mouth daily.     meloxicam (MOBIC) 15 MG tablet TAKE 1 TABLET BY MOUTH EVERY DAY 30 tablet 2   nystatin (MYCOSTATIN) 100000 UNIT/ML suspension Swish and swallow with 5 ml up to QID prn for thrush 400 mL 0   OLANZapine (ZYPREXA) 10 MG tablet Take 1 tablet (10 mg total) by mouth at bedtime. 30 tablet 2   omeprazole (PRILOSEC) 40 MG capsule TAKE ONE CAPSULE BY MOUTH TWICE DAILY 180 capsule 3   ondansetron (ZOFRAN) 8 MG tablet Take 1 tablet (8 mg total) by mouth 2 (two) times daily. 60 tablet 1   Pancrelipase, Lip-Prot-Amyl, (CREON PO) Take by mouth.     permethrin (ELIMITE) 5 % cream      potassium chloride SA (KLOR-CON M) 20 MEQ tablet TAKE 1 TABLET BY MOUTH TWICE DAILY 60 tablet 2   prochlorperazine (COMPAZINE) 10 MG tablet Take 1 tablet (10 mg total) by mouth every 6 (six) hours as needed for nausea or vomiting. 30 tablet 0   Rimegepant Sulfate (NURTEC) 75 MG TBDP Take 1 tablet (75 mg total) by mouth daily as needed (for migraine-). Take within 15 minutes of initial symptoms 8 tablet 5   topiramate (TOPAMAX) 25 MG tablet TAKE 1 TABLET BY MOUTH AT BEDTIME FOR 7 DAYS, THEN TAKE 2 TABLETS FOR 7 DAYS, THEN TAKE 3 TABLETS FOR 7 DAYS, THEN 4 TABLETS there AFTER, STOP increasing if migraines dramatically improve, FOR MIGRAINE prevention     triamcinolone ointment (KENALOG) 0.5 % Apply 1 Application topically 3 (three) times daily. Apply thin-layer on affected areas, no more than 2 weeks consistently. 60 g 2   No current facility-administered medications for this visit.   Facility-Administered Medications  Ordered in Other Visits  Medication Dose Route Frequency Provider Last Rate Last Admin   atezolizumab (TECENTRIQ) 1,200 mg in sodium chloride 0.9 % 250 mL chemo infusion  1,200 mg Intravenous Once Ulysees Barns IV, MD 540 mL/hr at 11/13/22 1531 1,200 mg at 11/13/22 1531   heparin lock flush 100 unit/mL  500 Units Intracatheter Once PRN Jaci Standard, MD       sodium chloride flush (NS) 0.9 % injection 10 mL  10 mL Intracatheter PRN Jaci Standard, MD        REVIEW OF SYSTEMS:   Constitutional: ( - ) fevers, ( - )  chills , ( - ) night sweats Eyes: ( - ) blurriness of vision, ( - ) double vision, ( - ) watery eyes Ears, nose, mouth, throat, and face: ( - ) mucositis, ( - ) sore throat Respiratory: ( - ) cough, ( -) dyspnea, ( - ) wheezes Cardiovascular: ( - ) palpitation, ( - ) chest discomfort, ( - ) lower extremity swelling Gastrointestinal:  ( +) nausea, ( - ) heartburn, ( - ) change in bowel habits Skin: ( - ) abnormal skin rashes Lymphatics: ( - ) new lymphadenopathy, ( - ) easy bruising Neurological: (+ ) numbness, ( - ) tingling, ( - ) new weaknesses Behavioral/Psych: ( - ) mood change, ( - ) new changes  All other systems were reviewed with the patient and are negative.  PHYSICAL EXAMINATION: ECOG PERFORMANCE STATUS: 1 -  Symptomatic but completely ambulatory  Vitals:   11/13/22 1338  BP: (!) 142/82  Pulse: 100  Resp: 18  Temp: 98 F (36.7 C)  SpO2: 96%   Filed Weights   11/13/22 1338  Weight: 175 lb 9.6 oz (79.7 kg)    GENERAL: Well-appearing middle-age Caucasian female, alert, no distress and comfortable SKIN: skin color, texture, turgor are normal, no rashes or significant lesions EYES: conjunctiva are pink and non-injected, sclera clear LUNGS:  normal breathing effort. Diffuse wheezing hear on ausculation.  HEART: regular rate & rhythm and no murmurs. Mild bilateral lower extremity edema Musculoskeletal: no cyanosis of digits and no clubbing  PSYCH: alert  & oriented x 3, fluent speech NEURO: no focal motor/sensory deficits  LABORATORY DATA:  I have reviewed the data as listed    Latest Ref Rng & Units 11/13/2022    1:15 PM 10/23/2022   10:30 AM 10/02/2022   10:36 AM  CBC  WBC 4.0 - 10.5 K/uL 9.1  9.9  9.1   Hemoglobin 12.0 - 15.0 g/dL 57.8  46.9  62.9   Hematocrit 36.0 - 46.0 % 44.6  45.5  44.2   Platelets 150 - 400 K/uL 171  208  167        Latest Ref Rng & Units 11/13/2022    1:15 PM 10/23/2022   10:30 AM 10/02/2022   10:36 AM  CMP  Glucose 70 - 99 mg/dL 528  413  91   BUN 8 - 23 mg/dL 12  15  15    Creatinine 0.44 - 1.00 mg/dL 2.44  0.10  2.72   Sodium 135 - 145 mmol/L 138  137  139   Potassium 3.5 - 5.1 mmol/L 3.3  4.0  3.6   Chloride 98 - 111 mmol/L 102  104  102   CO2 22 - 32 mmol/L 26  25  27    Calcium 8.9 - 10.3 mg/dL 8.8  8.8  8.9   Total Protein 6.5 - 8.1 g/dL 6.3  6.3  6.5   Total Bilirubin 0.3 - 1.2 mg/dL 0.3  0.4  0.5   Alkaline Phos 38 - 126 U/L 69  74  72   AST 15 - 41 U/L 12  13  15    ALT 0 - 44 U/L 8  9  10      No results found for: "MPROTEIN" Lab Results  Component Value Date   KPAFRELGTCHN 0.75 06/30/2014   LAMBDASER 3.78 (H) 06/30/2014   KAPLAMBRATIO 0.20 (L) 06/30/2014     RADIOGRAPHIC STUDIES: CT CHEST ABDOMEN PELVIS W CONTRAST  Result Date: 10/28/2022 CLINICAL DATA:  Small-cell lung cancer. Ongoing immunotherapy. Asymptomatic. * Tracking Code: BO * EXAM: CT CHEST, ABDOMEN, AND PELVIS WITH CONTRAST TECHNIQUE: Multidetector CT imaging of the chest, abdomen and pelvis was performed following the standard protocol during bolus administration of intravenous contrast. RADIATION DOSE REDUCTION: This exam was performed according to the departmental dose-optimization program which includes automated exposure control, adjustment of the mA and/or kV according to patient size and/or use of iterative reconstruction technique. CONTRAST:  OMNIPAQUE IOHEXOL 300 MG/ML  SOLN COMPARISON:  07/26/2022 FINDINGS: CT  CHEST FINDINGS Cardiovascular: Right Port-A-Cath tip high right atrium. Aortic atherosclerosis. Normal heart size, without pericardial effusion. Lad coronary artery calcification. No central pulmonary embolism, on this non-dedicated study. Mediastinum/Nodes: No supraclavicular adenopathy. No axillary adenopathy. No mediastinal or hilar adenopathy. Lungs/Pleura: No pleural fluid. Mild centrilobular and paraseptal emphysema. No suspicious pulmonary nodule or mass. Musculoskeletal: No acute osseous abnormality. CT  ABDOMEN PELVIS FINDINGS Hepatobiliary: Normal liver. Cholecystectomy, without biliary ductal dilatation. Pancreas: Mild pancreatic atrophy. No duct dilatation or acute inflammation. Spleen: Normal in size, without focal abnormality. Adrenals/Urinary Tract: Normal right adrenal gland. A left adrenal 1.4 cm nodule has been characterized as an adenoma and does not warrant specific imaging follow-up. Interpolar right renal 1.0 cm lesion is similar in size on the prior, measuring slightly greater than fluid density. This has been previously characterized as a hemorrhagic/proteinaceous cyst and does not warrant imaging follow-up. Mild renal cortical scarring within the left kidney. No hydronephrosis. Degraded evaluation of the pelvis, secondary to beam hardening artifact from right hip arthroplasty. Trace air in the nondependent bladder. Otherwise grossly normal urinary bladder. Stomach/Bowel: Normal stomach, without wall thickening. Colonic stool burden suggests constipation. Scattered colonic diverticula. Normal terminal ileum. Normal small bowel. Vascular/Lymphatic: Aortic atherosclerosis. No abdominopelvic adenopathy. Reproductive: Hysterectomy.  No adnexal mass. Other: No significant free fluid. No evidence of omental or peritoneal disease. Musculoskeletal: Intra-articular loose body within the inferior left hip. Right hip arthroplasty. Upper lumbar spondylosis/degenerative disc disease, most significant at  L2-3. Convex left thoracolumbar spine curvature. IMPRESSION: 1. No recurrent or metastatic disease in the chest, abdomen, or pelvis. 2. Aortic atherosclerosis (ICD10-I70.0), coronary artery atherosclerosis and emphysema (ICD10-J43.9). 3.  Possible constipation. 4. Left adrenal adenoma. 5. Air within the nondependent bladder. Correlate with instrumentation. Electronically Signed   By: Jeronimo Greaves M.D.   On: 10/28/2022 12:15    ASSESSMENT & PLAN [name] 67 y.o. female with medical history significant for extensive stage small cell lung cancer who presents for a follow up visit.   After review of the labs, review of the records, and discussion with the patient the patients findings are most consistent with extensive stage small cell lung cancer with metastasis from the right lower lobe to the lymph nodes of the abdomen.  At this time we will pursue triple therapy with carboplatin, etoposide, and atezolizumab.  After 4 cycles we will convert to maintenance atezolizumab alone.  We previously discussed the risks and benefits of this therapy and the patient was in agreement to proceed with this treatment.  The treatment of choice consist of carboplatin, etoposide, and atezolizumab.  The regimen consists of carboplatin AUC of 5 IV on day 1, etoposide 100 mg per metered squared IV on day 1, 2, and 3 and atezolizumab 1200 mg on day 1.  This continues for 21-day cycles.  After 4 cycles the patient proceeds with atezolizumab maintenance therapy alone.    # Small Cell Lung Cancer, Extensive Stage -- MRI of the brain shows no evidence of intracranial spread --Findings are currently consistent with metastatic small cell lung cancer with metastatic spread to the lymph nodes of the abdomen -- Started carboplatin, etoposide, and atezolizumab on 11/13/2020. Transitioned to maintenance Atezolizumab on 02/22/2021.  -- Most recent CT CAP from 10/21/2022 which shows no evidence of recurrence. Repeat scan due in  December 2024.  Plan: --today is Cycle 32 Day 1 of Atezolizumab maintenance.  --labs today were reviewed and adequate for treatment. Hgb 14.8, white blood cell count 9.1, MCV 92.9, and platelets of 171. Creatinine stable at 1.24. LFTs in range.  --Proceed with treatment without any dose modifications.  --RTC in 3 weeks for a follow up before Cycle 33  #Dysuria #Urinary urgency --Obtained UA and culture today --Sent cipro 250 mg BID x 5 days.   # Early Cirrhosis --noted on MR abdomen from 05/22/2022. [place] with elastography performed on 06/18/2022. --continue  to follow with GI.   #Right renal lesion: --Noted to be benign on most recent MRI of the abdomen.  #Diarrhea-improved --stool sent for C. Diff and GI pathology panel, all negative --patient following with GI and found to have pancreatic insufficiency and started on creon with improvement of symptoms.   #Nausea: --Symptoms improved with zofran and olanzapine.  --Added IV zofran to infusion premeds for better symptom control.   #Hypokalemia:  --currently taking potassium chloride 20 meq BID --potassium level is still 3.3 today. Continue PO supplementation.   #Lower extremity edema --Currently takes lasix 20 mg 1-2 times a day. --Stable. Continue to monitor.   #Neuropathy involving fingers/feet: --Patient currently takes Cymbalta   #Supportive Care -- chemotherapy education complete -- port placed -- zofran 8mg  q8H PRN and compazine 10mg  PO q6H for nausea -- EMLA cream for port  Orders Placed This Encounter  Procedures   Culture, Urine    Standing Status:   Future    Number of Occurrences:   1    Standing Expiration Date:   11/13/2023   Urinalysis, Complete w Microscopic    Standing Status:   Future    Number of Occurrences:   1    Standing Expiration Date:   11/13/2023   All questions were answered. The patient knows to call the clinic with any problems, questions or concerns.  I have spent a total of 30 minutes  minutes of face-to-face and non-face-to-face time, preparing to see the patient, performing a medically appropriate examination, counseling and educating the patient, ordering medications/tests, documenting clinical information in the electronic health record, and care coordination.   Georga Kaufmann PA-C Dept of Hematology and Oncology [facility] Phone: [phone]   11/13/2022 3:51 PM

## 2022-11-14 ENCOUNTER — Telehealth: Payer: Self-pay

## 2022-11-14 LAB — T4: T4, Total: 10.4 ug/dL (ref 4.5–12.0)

## 2022-11-14 NOTE — Telephone Encounter (Signed)
-----   Message from Briant Cedar sent at 11/14/2022 11:31 AM EDT ----- Please notify patient that she is not iron deficient.   Her urine did come back positive for UTI. I sent cipro x 5 days. I am waiting on urine culture sensitivities to determine if we need to change her antibiotics. I will follow up if we need to. ----- Message ----- From: Interface, Lab In [facility] Sent: 11/13/2022   5:29 PM EDT To: Briant Cedar, PA-C

## 2022-11-14 NOTE — Telephone Encounter (Signed)
Pt advised with VU and agreed to this plan of care.

## 2022-11-15 LAB — URINE CULTURE: Culture: >=100000 — AB

## 2022-11-20 ENCOUNTER — Other Ambulatory Visit: Payer: Self-pay | Admitting: Physician Assistant

## 2022-11-20 NOTE — Telephone Encounter (Signed)
Please review pharmacy note and advise refill. Last visit VV in August

## 2022-11-25 ENCOUNTER — Ambulatory Visit: Payer: 59 | Admitting: Dermatology

## 2022-11-29 ENCOUNTER — Other Ambulatory Visit: Payer: Self-pay | Admitting: Physical Medicine and Rehabilitation

## 2022-12-02 ENCOUNTER — Other Ambulatory Visit: Payer: Self-pay | Admitting: Physical Medicine and Rehabilitation

## 2022-12-02 ENCOUNTER — Other Ambulatory Visit: Payer: Self-pay | Admitting: Physician Assistant

## 2022-12-02 NOTE — Telephone Encounter (Signed)
Last OV; 09/05/22  Next OV: 01/06/23  Last Filled 09/03/22  Quantity: 90 w/ 2 refills  Called pharmacy last refill was completed 10/31/22 pt taking TID

## 2022-12-04 ENCOUNTER — Inpatient Hospital Stay: Payer: 59 | Admitting: Hematology and Oncology

## 2022-12-04 ENCOUNTER — Inpatient Hospital Stay: Payer: 59

## 2022-12-04 ENCOUNTER — Inpatient Hospital Stay: Payer: 59 | Admitting: Dietician

## 2022-12-09 ENCOUNTER — Telehealth: Payer: Self-pay | Admitting: Hematology and Oncology

## 2022-12-13 ENCOUNTER — Encounter: Payer: Self-pay | Admitting: Physical Medicine and Rehabilitation

## 2022-12-13 ENCOUNTER — Encounter: Payer: Self-pay | Admitting: Physician Assistant

## 2022-12-13 ENCOUNTER — Encounter: Payer: 59 | Attending: Physical Medicine and Rehabilitation | Admitting: Physical Medicine and Rehabilitation

## 2022-12-13 VITALS — BP 105/76 | HR 109 | Ht 62.0 in | Wt 174.4 lb

## 2022-12-13 DIAGNOSIS — C3490 Malignant neoplasm of unspecified part of unspecified bronchus or lung: Secondary | ICD-10-CM | POA: Insufficient documentation

## 2022-12-13 DIAGNOSIS — G894 Chronic pain syndrome: Secondary | ICD-10-CM | POA: Diagnosis not present

## 2022-12-13 DIAGNOSIS — M7918 Myalgia, other site: Secondary | ICD-10-CM | POA: Insufficient documentation

## 2022-12-13 DIAGNOSIS — F419 Anxiety disorder, unspecified: Secondary | ICD-10-CM | POA: Diagnosis not present

## 2022-12-13 DIAGNOSIS — F32A Depression, unspecified: Secondary | ICD-10-CM | POA: Insufficient documentation

## 2022-12-13 DIAGNOSIS — G43119 Migraine with aura, intractable, without status migrainosus: Secondary | ICD-10-CM | POA: Insufficient documentation

## 2022-12-13 MED ORDER — HYDROCODONE-ACETAMINOPHEN 10-325 MG PO TABS: 1.0000 | ORAL_TABLET | ORAL | 0 refills | Status: DC | PRN

## 2022-12-13 MED ORDER — LIDOCAINE HCL 1 % IJ SOLN
3.0000 mL | Freq: Once | INTRAMUSCULAR | Status: AC
  Administered 2022-12-13: 3 mL

## 2022-12-13 MED ORDER — LEVOTHYROXINE SODIUM 137 MCG PO TABS: 137.0000 ug | ORAL_TABLET | Freq: Every day | ORAL | 3 refills | Status: DC

## 2022-12-13 MED ORDER — ARIPIPRAZOLE 2 MG PO TABS: 2.0000 mg | ORAL_TABLET | Freq: Every day | ORAL | 5 refills | Status: DC

## 2022-12-13 NOTE — Progress Notes (Signed)
Pt is a 67 yr old female with hx of newly dx'd COPD, still smoking, HTN, DM- diet controlled; anxiety and depression;  Scoliosis and deg back issues,  Generalized OA;  chronic pain- here for f/u of chronic pain issues. With new 2.2 cm R lower pulmonary nodule and hemorrhoids.  Now has dx of Small cell lung cancer extensive stage per oncology note. Here for f/u on chronic pain.      Pt reports has a HA-  Better than was this AM- Took Nurtec-  Had to take some Tums- got nauseated  A time with nausea.  Zofran helps- has to take about every morning.  One of side effects of immunotherapy. And Skin rash- back looks horrible due to rash-  Disgusted with "everything".   No desire to do anything- will wait 2 hours til husband will get her coffee. Doesn't give a flying flip anymore- stays in night gown all day now.   Busts out crying for no reason.  More depressed- feels like so depressed and meds not helping anymore.  Depression got worse 3-4 weeks ago.  Had to cancel last immunotherapy appt-    Can tell it's time to get TrP injections- can really tell they help but just wore off.     Plan: For resistant depression- Abilify 2 mg nightly x 2 weeks-  and if need be, can increase to 4 mg nightly- after 2 weeks- for resistant depression.   2. Don't stop Duloxetine 120 mg nightly- since also helps with nerve pain. Doesn't need refills.    3. Will refill Synthroid for 1 year 137 mcg daily.    4. Will refill Norco 10/325 # 150-    5. Patient here for trigger point injections for  Consent done and on chart.  Cleaned areas with alcohol and injected using a 27 gauge 1.5 inch needle  Injected  Using 1% Lidocaine with no EPI  Upper traps Levators Posterior scalenes Middle scalenes Splenius Capitus Pectoralis Major Rhomboids Infraspinatus Teres Major/minor Thoracic paraspinals Lumbar paraspinals Other injections-    Patient's level of pain prior was Current level of pain after  injections is  There was no bleeding or complications.  Patient was advised to drink a lot of water on day after injections to flush system Will have increased soreness for 12-48 hours after injections.  Can use Lidocaine patches the day AFTER injections Can use theracane on day of injections in places didn't inject Can use heating pad 4-6 hours AFTER injections  6. Con't Topiramate- 100 mg at bedtime- for migraines Doesn't think Topamax is cause of depression- so doesn't want to stop- has helped migraines- and HA's much better-   7. Use soft loofa or soft sponge with long handles and place on back to help heal wounds on back.   8/ F/U in 3 months with me and 6 wekes for [facility]- for chornic pain and TrP injections   I spent a total of 36   minutes on total care today- >50% coordination of care- due to 6 minutes on injections- 30 minutes on discussion on anxiety/depression, HA's- and grief- due to family issues; and changing meds for depression and refills

## 2022-12-13 NOTE — Patient Instructions (Signed)
Plan: For resistant depression- Abilify 2 mg nightly x 2 weeks-  and if need be, can increase to 4 mg nightly- after 2 weeks- for resistant depression.   2. Don't stop Duloxetine 120 mg nightly- since also helps with nerve pain. Doesn't need refills.    3. Will refill Synthroid for 1 year 137 mcg daily.    4. Will refill Norco 10/325 # 150-    5. Patient here for trigger point injections for  Consent done and on chart.  Cleaned areas with alcohol and injected using a 27 gauge 1.5 inch needle  Injected  Using 1% Lidocaine with no EPI  Upper traps Levators Posterior scalenes Middle scalenes Splenius Capitus Pectoralis Major Rhomboids Infraspinatus Teres Major/minor Thoracic paraspinals Lumbar paraspinals Other injections-    Patient's level of pain prior was Current level of pain after injections is  There was no bleeding or complications.  Patient was advised to drink a lot of water on day after injections to flush system Will have increased soreness for 12-48 hours after injections.  Can use Lidocaine patches the day AFTER injections Can use theracane on day of injections in places didn't inject Can use heating pad 4-6 hours AFTER injections  6. Con't Topiramate- 100 mg at bedtime- for migraines Doesn't think Topamax is cause of depression- so doesn't want to stop- has helped migraines- and HA's much better-   7. Use soft loofa or soft sponge with long handles and place on back to help heal wounds on back.   8/ F/U in 3 months with me and 6 wekes for [facility]- for chornic pain and TrP injections

## 2022-12-18 ENCOUNTER — Encounter: Payer: Self-pay | Admitting: Physician Assistant

## 2022-12-21 ENCOUNTER — Other Ambulatory Visit: Payer: Self-pay | Admitting: Physician Assistant

## 2022-12-21 DIAGNOSIS — R600 Localized edema: Secondary | ICD-10-CM

## 2022-12-22 ENCOUNTER — Encounter: Payer: Self-pay | Admitting: Hematology and Oncology

## 2022-12-24 ENCOUNTER — Telehealth: Payer: Self-pay

## 2022-12-24 ENCOUNTER — Other Ambulatory Visit: Payer: Self-pay

## 2022-12-24 ENCOUNTER — Inpatient Hospital Stay: Payer: 59

## 2022-12-24 ENCOUNTER — Inpatient Hospital Stay: Payer: 59 | Attending: Hematology and Oncology | Admitting: Dietician

## 2022-12-24 ENCOUNTER — Inpatient Hospital Stay: Payer: 59 | Attending: Hematology and Oncology

## 2022-12-24 ENCOUNTER — Inpatient Hospital Stay: Payer: 59 | Attending: Hematology and Oncology | Admitting: Hematology and Oncology

## 2022-12-24 VITALS — BP 118/81 | HR 87 | Resp 16

## 2022-12-24 VITALS — BP 126/83 | HR 100 | Temp 98.1°F | Resp 16 | Wt 173.8 lb

## 2022-12-24 DIAGNOSIS — Z8719 Personal history of other diseases of the digestive system: Secondary | ICD-10-CM | POA: Diagnosis not present

## 2022-12-24 DIAGNOSIS — Z820 Family history of epilepsy and other diseases of the nervous system: Secondary | ICD-10-CM | POA: Insufficient documentation

## 2022-12-24 DIAGNOSIS — I10 Essential (primary) hypertension: Secondary | ICD-10-CM | POA: Insufficient documentation

## 2022-12-24 DIAGNOSIS — F419 Anxiety disorder, unspecified: Secondary | ICD-10-CM | POA: Diagnosis not present

## 2022-12-24 DIAGNOSIS — Z5112 Encounter for antineoplastic immunotherapy: Secondary | ICD-10-CM | POA: Insufficient documentation

## 2022-12-24 DIAGNOSIS — Z95828 Presence of other vascular implants and grafts: Secondary | ICD-10-CM

## 2022-12-24 DIAGNOSIS — K219 Gastro-esophageal reflux disease without esophagitis: Secondary | ICD-10-CM | POA: Diagnosis not present

## 2022-12-24 DIAGNOSIS — Z882 Allergy status to sulfonamides status: Secondary | ICD-10-CM | POA: Insufficient documentation

## 2022-12-24 DIAGNOSIS — F32A Depression, unspecified: Secondary | ICD-10-CM | POA: Diagnosis not present

## 2022-12-24 DIAGNOSIS — Z8261 Family history of arthritis: Secondary | ICD-10-CM | POA: Insufficient documentation

## 2022-12-24 DIAGNOSIS — Z880 Allergy status to penicillin: Secondary | ICD-10-CM | POA: Insufficient documentation

## 2022-12-24 DIAGNOSIS — G8929 Other chronic pain: Secondary | ICD-10-CM | POA: Diagnosis not present

## 2022-12-24 DIAGNOSIS — F17210 Nicotine dependence, cigarettes, uncomplicated: Secondary | ICD-10-CM | POA: Insufficient documentation

## 2022-12-24 DIAGNOSIS — Z79620 Long term (current) use of immunosuppressive biologic: Secondary | ICD-10-CM | POA: Insufficient documentation

## 2022-12-24 DIAGNOSIS — R21 Rash and other nonspecific skin eruption: Secondary | ICD-10-CM | POA: Diagnosis not present

## 2022-12-24 DIAGNOSIS — E785 Hyperlipidemia, unspecified: Secondary | ICD-10-CM | POA: Insufficient documentation

## 2022-12-24 DIAGNOSIS — E876 Hypokalemia: Secondary | ICD-10-CM | POA: Diagnosis not present

## 2022-12-24 DIAGNOSIS — C3431 Malignant neoplasm of lower lobe, right bronchus or lung: Secondary | ICD-10-CM

## 2022-12-24 DIAGNOSIS — R630 Anorexia: Secondary | ICD-10-CM | POA: Insufficient documentation

## 2022-12-24 DIAGNOSIS — K7460 Unspecified cirrhosis of liver: Secondary | ICD-10-CM | POA: Diagnosis not present

## 2022-12-24 DIAGNOSIS — Z814 Family history of other substance abuse and dependence: Secondary | ICD-10-CM | POA: Insufficient documentation

## 2022-12-24 DIAGNOSIS — Z90721 Acquired absence of ovaries, unilateral: Secondary | ICD-10-CM | POA: Insufficient documentation

## 2022-12-24 DIAGNOSIS — Z886 Allergy status to analgesic agent status: Secondary | ICD-10-CM | POA: Diagnosis not present

## 2022-12-24 DIAGNOSIS — Z811 Family history of alcohol abuse and dependence: Secondary | ICD-10-CM | POA: Insufficient documentation

## 2022-12-24 DIAGNOSIS — Z885 Allergy status to narcotic agent status: Secondary | ICD-10-CM | POA: Diagnosis not present

## 2022-12-24 DIAGNOSIS — Z809 Family history of malignant neoplasm, unspecified: Secondary | ICD-10-CM | POA: Insufficient documentation

## 2022-12-24 DIAGNOSIS — Z8249 Family history of ischemic heart disease and other diseases of the circulatory system: Secondary | ICD-10-CM | POA: Insufficient documentation

## 2022-12-24 DIAGNOSIS — J449 Chronic obstructive pulmonary disease, unspecified: Secondary | ICD-10-CM | POA: Diagnosis not present

## 2022-12-24 DIAGNOSIS — Z888 Allergy status to other drugs, medicaments and biological substances status: Secondary | ICD-10-CM | POA: Insufficient documentation

## 2022-12-24 DIAGNOSIS — M1990 Unspecified osteoarthritis, unspecified site: Secondary | ICD-10-CM | POA: Diagnosis not present

## 2022-12-24 DIAGNOSIS — Z825 Family history of asthma and other chronic lower respiratory diseases: Secondary | ICD-10-CM | POA: Insufficient documentation

## 2022-12-24 DIAGNOSIS — Z79890 Hormone replacement therapy (postmenopausal): Secondary | ICD-10-CM | POA: Insufficient documentation

## 2022-12-24 DIAGNOSIS — Z9049 Acquired absence of other specified parts of digestive tract: Secondary | ICD-10-CM | POA: Insufficient documentation

## 2022-12-24 DIAGNOSIS — Z79899 Other long term (current) drug therapy: Secondary | ICD-10-CM | POA: Diagnosis not present

## 2022-12-24 DIAGNOSIS — Z823 Family history of stroke: Secondary | ICD-10-CM | POA: Insufficient documentation

## 2022-12-24 DIAGNOSIS — Z90710 Acquired absence of both cervix and uterus: Secondary | ICD-10-CM | POA: Insufficient documentation

## 2022-12-24 LAB — CBC WITH DIFFERENTIAL (CANCER CENTER ONLY)
Abs Immature Granulocytes: 0.05 10*3/uL (ref 0.00–0.07)
Basophils Absolute: 0.1 10*3/uL (ref 0.0–0.1)
Basophils Relative: 1 %
Eosinophils Absolute: 0.4 10*3/uL (ref 0.0–0.5)
Eosinophils Relative: 5 %
HCT: 42.2 % (ref 36.0–46.0)
Hemoglobin: 15.1 g/dL — ABNORMAL HIGH (ref 12.0–15.0)
Immature Granulocytes: 1 %
Lymphocytes Relative: 23 %
Lymphs Abs: 2.1 10*3/uL (ref 0.7–4.0)
MCH: 31.7 pg (ref 26.0–34.0)
MCHC: 35.8 g/dL (ref 30.0–36.0)
MCV: 88.7 fL (ref 80.0–100.0)
Monocytes Absolute: 0.7 10*3/uL (ref 0.1–1.0)
Monocytes Relative: 7 %
Neutro Abs: 5.9 10*3/uL (ref 1.7–7.7)
Neutrophils Relative %: 63 %
Platelet Count: 196 10*3/uL (ref 150–400)
RBC: 4.76 MIL/uL (ref 3.87–5.11)
RDW: 13.9 % (ref 11.5–15.5)
WBC Count: 9.1 10*3/uL (ref 4.0–10.5)
nRBC: 0 % (ref 0.0–0.2)

## 2022-12-24 LAB — CMP (CANCER CENTER ONLY)
ALT: 7 U/L (ref 0–44)
AST: 15 U/L (ref 15–41)
Albumin: 3.7 g/dL (ref 3.5–5.0)
Alkaline Phosphatase: 67 U/L (ref 38–126)
Anion gap: 11 (ref 5–15)
BUN: 12 mg/dL (ref 8–23)
CO2: 27 mmol/L (ref 22–32)
Calcium: 8.7 mg/dL — ABNORMAL LOW (ref 8.9–10.3)
Chloride: 88 mmol/L — ABNORMAL LOW (ref 98–111)
Creatinine: 1.18 mg/dL — ABNORMAL HIGH (ref 0.44–1.00)
GFR, Estimated: 51 mL/min — ABNORMAL LOW (ref >60)
Glucose, Bld: 98 mg/dL (ref 70–99)
Potassium: 2.7 mmol/L — CL (ref 3.5–5.1)
Sodium: 126 mmol/L — ABNORMAL LOW (ref 135–145)
Total Bilirubin: 0.5 mg/dL (ref <1.2)
Total Protein: 6.5 g/dL (ref 6.5–8.1)

## 2022-12-24 LAB — TSH: TSH: 0.856 u[IU]/mL (ref 0.350–4.500)

## 2022-12-24 MED ORDER — SODIUM CHLORIDE 0.9% FLUSH
10.0000 mL | INTRAVENOUS | Status: DC | PRN
  Administered 2022-12-24: 10 mL

## 2022-12-24 MED ORDER — SODIUM CHLORIDE 0.9% FLUSH
10.0000 mL | Freq: Once | INTRAVENOUS | Status: AC
Start: 2022-12-24 — End: 2022-12-24
  Administered 2022-12-24: 10 mL

## 2022-12-24 MED ORDER — ONDANSETRON HCL 4 MG/2ML IJ SOLN
8.0000 mg | Freq: Once | INTRAMUSCULAR | Status: AC
  Administered 2022-12-24: 8 mg via INTRAVENOUS
  Filled 2022-12-24: qty 4

## 2022-12-24 MED ORDER — HEPARIN SOD (PORK) LOCK FLUSH 100 UNIT/ML IV SOLN
500.0000 [IU] | Freq: Once | INTRAVENOUS | Status: AC | PRN
Start: 2022-12-24 — End: 2022-12-24
  Administered 2022-12-24: 500 [IU]

## 2022-12-24 MED ORDER — POTASSIUM CHLORIDE 10 MEQ/100ML IV SOLN
10.0000 meq | INTRAVENOUS | Status: AC
Start: 2022-12-24 — End: 2022-12-24
  Administered 2022-12-24 (×2): 10 meq via INTRAVENOUS
  Filled 2022-12-24: qty 100

## 2022-12-24 MED ORDER — ATEZOLIZUMAB CHEMO INJECTION 1200 MG/20ML
1200.0000 mg | Freq: Once | INTRAVENOUS | Status: AC
  Administered 2022-12-24: 1200 mg via INTRAVENOUS
  Filled 2022-12-24: qty 20

## 2022-12-24 MED ORDER — SODIUM CHLORIDE 0.9 % IV SOLN: Freq: Once | INTRAVENOUS | Status: AC

## 2022-12-24 NOTE — Patient Instructions (Signed)
[facility] - A DEPT OF [facility]  Discharge Instructions: Thank you for choosing [facility] to provide your oncology and hematology care.   If you have a lab appointment with the Cancer Center, please go directly to the Cancer Center and check in at the registration area.   Wear comfortable clothing and clothing appropriate for easy access to any Portacath or PICC line.   We strive to give you quality time with your provider. You may need to reschedule your appointment if you arrive late (15 or more minutes).  Arriving late affects you and other patients whose appointments are after yours.  Also, if you miss three or more appointments without notifying the office, you may be dismissed from the clinic at the provider's discretion.      For prescription refill requests, have your pharmacy contact our office and allow 72 hours for refills to be completed.    Today you received the following chemotherapy and/or immunotherapy agents: Tecentriq      To help prevent nausea and vomiting after your treatment, we encourage you to take your nausea medication as directed.  BELOW ARE SYMPTOMS THAT SHOULD BE REPORTED IMMEDIATELY: *FEVER GREATER THAN 100.4 F (38 C) OR HIGHER *CHILLS OR SWEATING *NAUSEA AND VOMITING THAT IS NOT CONTROLLED WITH YOUR NAUSEA MEDICATION *UNUSUAL SHORTNESS OF BREATH *UNUSUAL BRUISING OR BLEEDING *URINARY PROBLEMS (pain or burning when urinating, or frequent urination) *BOWEL PROBLEMS (unusual diarrhea, constipation, pain near the anus) TENDERNESS IN MOUTH AND THROAT WITH OR WITHOUT PRESENCE OF ULCERS (sore throat, sores in mouth, or a toothache) UNUSUAL RASH, SWELLING OR PAIN  UNUSUAL VAGINAL DISCHARGE OR ITCHING   Items with * indicate a potential emergency and should be followed up as soon as possible or go to the Emergency Department if any problems should occur.  Please show the CHEMOTHERAPY ALERT CARD or IMMUNOTHERAPY  ALERT CARD at check-in to the Emergency Department and triage nurse.  Should you have questions after your visit or need to cancel or reschedule your appointment, please contact [facility] - A DEPT OF [facility]  Dept: [phone]  and follow the prompts.  Office hours are 8:00 a.m. to 4:30 p.m. Monday - Friday. Please note that voicemails left after 4:00 p.m. may not be returned until the following business day.  We are closed weekends and major holidays. You have access to a nurse at all times for urgent questions. Please call the main number to the clinic Dept: [phone] and follow the prompts.   For any non-urgent questions, you may also contact your provider using MyChart. We now offer e-Visits for anyone 27 and older to request care online for non-urgent symptoms. For details visit [email or web address].   Also download the MyChart app! Go to the app store, search "MyChart", open the app, select [facility], and log in with your MyChart username and password.  Hypokalemia Hypokalemia means that the amount of potassium in the blood is lower than normal. Potassium is a mineral (electrolyte) that helps regulate the amount of fluid in the body. It also stimulates muscle tightening (contraction) and helps nerves work properly. Normally, most of the body's potassium is inside cells, and only a very small amount is in the blood. Because the amount in the blood is so small, minor changes to potassium levels in the blood can be life-threatening. What are the causes? This condition may be caused by: Antibiotic medicine. Diarrhea or vomiting. Taking  too much of a medicine that helps you have a bowel movement (laxative) can cause diarrhea and lead to hypokalemia. Chronic kidney disease (CKD). Medicines that help the body get rid of excess fluid (diuretics). Eating disorders, such as anorexia or bulimia. Low magnesium levels in the body. Sweating a lot. What are  the signs or symptoms? Symptoms of this condition include: Weakness. Constipation. Fatigue. Muscle cramps. Mental confusion. Skipped heartbeats or irregular heartbeat (palpitations). Tingling or numbness. How is this diagnosed? This condition is diagnosed with a blood test. How is this treated? This condition may be treated by: Taking potassium supplements. Adjusting the medicines that you take. Eating more foods that contain a lot of potassium. If your potassium level is very low, you may need to get potassium through an IV and be monitored in the hospital. Follow these instructions at home: Eating and drinking  Eat a healthy diet. A healthy diet includes fresh fruits and vegetables, whole grains, healthy fats, and lean proteins. If told, eat more foods that contain a lot of potassium. These include: Nuts, such as peanuts and pistachios. Seeds, such as sunflower seeds and pumpkin seeds. Peas, lentils, and lima beans. Whole grain and bran cereals and breads. Fresh fruits and vegetables, such as apricots, avocado, bananas, cantaloupe, kiwi, oranges, tomatoes, asparagus, and potatoes. Juices, such as orange, tomato, and prune. Lean meats, including fish. Milk and milk products, such as yogurt. General instructions Take over-the-counter and prescription medicines only as told by your health care provider. This includes vitamins, natural food products, and supplements. Keep all follow-up visits. This is important. Contact a health care provider if: You have weakness that gets worse. You feel your heart pounding or racing. You vomit. You have diarrhea. You have diabetes and you have trouble keeping your blood sugar in your target range. Get help right away if: You have chest pain. You have shortness of breath. You have vomiting or diarrhea that lasts for more than 2 days. You faint. These symptoms may be an emergency. Get help right away. Call 911. Do not wait to see if the  symptoms will go away. Do not drive yourself to the hospital. Summary Hypokalemia means that the amount of potassium in the blood is lower than normal. This condition is diagnosed with a blood test. Hypokalemia may be treated by taking potassium supplements, adjusting the medicines that you take, or eating more foods that are high in potassium. If your potassium level is very low, you may need to get potassium through an IV and be monitored in the hospital. This information is not intended to replace advice given to you by your health care provider. Make sure you discuss any questions you have with your health care provider. Document Revised: 09/28/2020 Document Reviewed: 09/28/2020 Elsevier Patient Education  2024 [employer].

## 2022-12-24 NOTE — Progress Notes (Signed)
Nutrition Follow-up:  Patient with extensive stage small cell lung cancer. She is receiving maintenance therapy with atezolizumab q21d.   Patient to receive Tecentriq + IV potassium today  Met with patient during infusion. She reports ongoing poor appetite. Patient says she has a house full of food, however nothing looks, sounds, or taste good. She reports usually eating a couple of eggs daily. Patient occasionally snacks and recalls little debbie peanut butter wafers most recently. Patient denies nausea, vomiting, diarrhea. She has been constipated lately. Patient is taking daily stool softeners without relief. Patient has taken one dose of miralax.    Medications: reviewed   Labs: K 2.7, Cr 1.18  Anthropometrics: Wt 173 lb 12.8 oz today - slowly trending down  11/15 - 174 lb 1.6 oz 10/16 - 175 lb 9.6 oz 9/25 - 176 lb 8 oz    NUTRITION DIAGNOSIS: Inadequate oral intake continues    INTERVENTION:  Continue encouragement for small frequent meals/snacks with adequate calories and protein Recommend daily bowl regimen Consider appetite stimulant with ongoing poor appetite and wt trends     MONITORING, EVALUATION, GOAL: wt trends, intake   NEXT VISIT: Wednesday December 18 during infusion

## 2022-12-24 NOTE — Telephone Encounter (Signed)
CRITICAL VALUE STICKER  CRITICAL VALUE:  potassium 2.7  RECEIVER (on-site recipient of call): Daneil Dolin, LPN  DATE & TIME NOTIFIED: 11:23  MESSENGER (representative from lab):  Lanora Manis  MD NOTIFIED: Jeanie Sewer, MD  TIME OF NOTIFICATION:11:25  RESPONSE:

## 2022-12-24 NOTE — Progress Notes (Signed)
[facility] [phone]) [phone]   [phone]) [phone]  PROGRESS NOTE  Patient Care Team: Allwardt, Crist Infante, PA-C as PCP - General (Physician Assistant) O'Neal, Ronnald Ramp, MD as PCP - Cardiology (Cardiology) Rachael Fee, MD as Attending Physician (Gastroenterology) Dr. Louie Bun, MD as Consulting Physician (Orthopedic Surgery) Ranee Gosselin, MD as Consulting Physician (Orthopedic Surgery) York Spaniel, MD (Inactive) as Consulting Physician (Neurology) Smitty Cords, OD as Consulting Physician (Optometry) Karel Jarvis Lesle Chris, MD as Consulting Physician (Neurology) Erroll Luna, [facility] (Inactive) (Pharmacist)  Hematological/Oncological History # Small Cell Lung Cancer, Extensive Stage 07/05/2020: CT abdomen for lower abdominal pain. New pulmonary nodular density noted 07/06/2020: CT chest showed 2.2 cm macrolobulated right lower lobe pulmonary nodule (favored) versus pathologically enlarged infrahilar lymph node 10/13/2020: PET CT scan performed, findings show 2 cm right lower lobe lung mass is hypermetabolic and consistent with primary lung neoplasm. Additionally found hypermetabolic 17 mm soft tissue lesion between the descending duodenum and the pancreatic head  10/19/2020: EGD to biopsy hypermetabolic lymph node. Biopsy results show small cell lung cancer 10/26/2020: establish care with Dr. Leonides Schanz  11/13/2020: Cycle 1 Day 1 of Carbo/Etop/Atezolizumab 12/04/2020: Cycle 2 Day 1 of Carbo/Etop/Atezolizumab 11/22-11/25/2022: admitted for E. Coli bacteremia/sepsis. Start of Cycle 3 delayed. 01/02/2021: Cycle 3 Day 1 of Carbo/Etop/Atezolizumab 01/23/2021: Cycle 4 Day 1 of Carbo/Etop/Atezolizumab 02/22/2021: Cycle 5 Day 1 of Atezolizumab Maintenance. Delayed start due to patient's COVID infection.  03/14/2021: Cycle 6 Day 1 of Atezolizumab Maintenance 04/05/2021: Cycle 7 Day 1 of Atezolizumab Maintenance 04/25/2021: Cycle 8 Day 1 of Atezolizumab  Maintenance 05/16/2021: Cycle 9 Day 1 of Atezolizumab Maintenance 06/06/2021: Cycle 10 Day 1 of Atezolizumab Maintenance 06/27/2021:  Cycle 11 Day 1 of Atezolizumab Maintenance 07/18/2021: Cycle 12 Day 1 of Atezolizumab Maintenance 08/08/2021: Cycle 13 Day 1 of Atezolizumab Maintenance 08/29/2021: Cycle 14 Day 1 of Atezolizumab Maintenance 09/19/2021: Cycle 15 Day 1 of Atezolizumab Maintenance 10/10/2021: Cycle 16 Day 1 of Atezolizumab Maintenance 10/31/2021: Cycle 17 Day 1 of Atezolizumab Maintenance 11/21/2021: Cycle 18 Day 1 of Atezolizumab Maintenance 12/12/2021: Cycle 19 Day 1 of Atezolizumab Maintenance 01/02/2022: Cycle 20 Day 1 of Atezolizumab Maintenance 01/23/2022: Cycle 21 Day 1 of Atezolizumab Maintenance 02/13/2022: treatment HELD due to diarrhea.  03/06/2022: Cycle 22 Day 1 of Atezolizumab Maintenance 03/27/2022: Cycle 23 Day 1 of Atezolizumab Maintenance 04/17/2022: Cycle 24 Day 1 of Atezolizumab Maintenance 05/08/2022:  Cycle 25 Day 1 of Atezolizumab Maintenance (Held due to patient preference for treatment holiday) 05/29/2022: Cycle 25 Day 1 of Atezolizumab Maintenance  06/19/2022: Cycle 26 Day 1 of Atezolizumab Maintenance  07/12/2022: Cycle 27 Day 1 of Atezolizumab Maintenance 07/31/2022: Cycle 28 Day 1 of Atezolizumab Maintenance HELD per patient request for fatigue.  08/22/2022: Cycle 28 Day 1 of Atezolizumab Maintenance 09/11/2022: Cycle 29 Day 1 of Atezolizumab Maintenance 10/03/2022: Cycle 30 Day 1 of Atezolizumab Maintenance 10/23/2022: Cycle 31 Day 1 of Atezolizumab Maintenance 11/13/2022: Cycle 32 Day 1 of Atezolizumab Maintenance 12/24/2022: Cycle 33 Day 1 of Atezolizumab Maintenance  Interval History:  [name] 67 y.o. female with medical history significant for extensive stage small cell lung cancer who presents for a follow up visit. The patient's last visit was on 11/13/2022.  She presents today to start cycle 33 of chemotherapy.   On exam today [name] reports  Thanksgiving is her favorite holiday but is unfortunately still having poor appetite.  She reports her energy levels are low but she is feeling better than when she canceled her  last visit 3 weeks ago.  She reports that she is still "dragging".  She notes that sometimes she spends all day in the night down the road watching television.  She reports that she has a "poor attitude".  She notes also that her brother has been quite sick as needed medical help but is not allowing her to help him get medical attention.  She reports that she continues to have a rash on her back which has been quite itchy.  Her urine has been dark but she is not having any burning or dysuria.  She reports that otherwise she remains at her baseline level of health and is willing and able to proceed with atezolizumab therapy at this time.  She denies fevers, chills, sweats, chest pain or cough. She has no other complaints.A full 10 point ROS is listed below.   MEDICAL HISTORY:  Past Medical History:  Diagnosis Date   Allergic rhinitis    Anemia    Anxiety    Chicken pox    Chronic back pain    COPD (chronic obstructive pulmonary disease) (HCC)    Depression    DM (diabetes mellitus) (HCC)    Essential hypertension    GERD (gastroesophageal reflux disease)    Headache    migraines   History of gastritis    EGD 2015   History of home oxygen therapy    2 liters at hs last 6 months   Hyperlipidemia    Hypothyroidism    Migraines    Osteoarthritis    oa   Scoliosis    Small cell lung cancer (HCC)    Stage 4    SURGICAL HISTORY: Past Surgical History:  Procedure Laterality Date   APPENDECTOMY     1985   BIOPSY  07/24/2017   Procedure: BIOPSY;  Surgeon: Rachael Fee, MD;  Location: [facility];  Service: Endoscopy;;   CARDIAC CATHETERIZATION N/A 10/31/2015   Procedure: Left Heart Cath and Coronary Angiography;  Surgeon: Marykay Lex, MD;  Location: [facility] INVASIVE CV LAB;  Service: Cardiovascular;   Laterality: N/A;   CARPAL TUNNEL RELEASE Left    CARPAL TUNNEL RELEASE Right    CHOLECYSTECTOMY  late 1980's   COLONOSCOPY WITH PROPOFOL N/A 07/24/2017   Procedure: COLONOSCOPY WITH PROPOFOL;  Surgeon: Rachael Fee, MD;  Location: [facility];  Service: Endoscopy;  Laterality: N/A;   ESOPHAGOGASTRODUODENOSCOPY N/A 07/24/2017   Procedure: ESOPHAGOGASTRODUODENOSCOPY (EGD);  Surgeon: Rachael Fee, MD;  Location: [facility];  Service: Endoscopy;  Laterality: N/A;   ESOPHAGOGASTRODUODENOSCOPY (EGD) WITH PROPOFOL N/A 10/19/2020   Procedure: ESOPHAGOGASTRODUODENOSCOPY (EGD) WITH PROPOFOL;  Surgeon: Rachael Fee, MD;  Location: [facility];  Service: Endoscopy;  Laterality: N/A;   FINE NEEDLE ASPIRATION N/A 10/19/2020   Procedure: FINE NEEDLE ASPIRATION (FNA) LINEAR;  Surgeon: Rachael Fee, MD;  Location: [facility];  Service: Endoscopy;  Laterality: N/A;   GALLBLADDER SURGERY  1991   HIP CLOSED REDUCTION Right 01/08/2016   Procedure: CLOSED MANIPULATION HIP;  Surgeon: Jene Every, MD;  Location: [facility];  Service: Orthopedics;  Laterality: Right;   HIP CLOSED REDUCTION Right 01/19/2016   Procedure: ATTEMPTED CLOSED REDUCTION RIGHT HIP;  Surgeon: Toni Arthurs, MD;  Location: [facility];  Service: Orthopedics;  Laterality: Right;   HIP CLOSED REDUCTION Right 01/20/2016   Procedure: CLOSED REDUCTION RIGHT TOTAL HIP;  Surgeon: Durene Romans, MD;  Location: [facility];  Service: Orthopedics;  Laterality: Right;   HIP CLOSED REDUCTION Right 02/17/2016   Procedure: CLOSED  REDUCTION RIGHT TOTAL HIP;  Surgeon: Samson Frederic, MD;  Location: [facility];  Service: Orthopedics;  Laterality: Right;   HIP CLOSED REDUCTION Right 02/28/2016   Procedure: CLOSED REDUCTION HIP;  Surgeon: Yolonda Kida, MD;  Location: [facility];  Service: Orthopedics;  Laterality: Right;   IR IMAGING GUIDED PORT INSERTION  11/01/2020   POLYPECTOMY  07/24/2017   Procedure: POLYPECTOMY;  Surgeon: Rachael Fee, MD;  Location: [facility]  ENDOS[facility];  Service: Endoscopy;;   TONSILLECTOMY     TOTAL ABDOMINAL HYSTERECTOMY     1985, with 1 ovary removed and 2 nd ovary removed 2003   TOTAL HIP ARTHROPLASTY Right    Original surgery 2006 with revision 2010   TOTAL HIP REVISION Right 01/01/2016   Procedure: TOTAL HIP REVISION;  Surgeon: Durene Romans, MD;  Location: [facility];  Service: Orthopedics;  Laterality: Right;   TOTAL HIP REVISION Right 03/02/2016   Procedure: TOTAL HIP REVISION;  Surgeon: Durene Romans, MD;  Location: [facility];  Service: Orthopedics;  Laterality: Right;   TOTAL HIP REVISION Right 09/02/2016   Procedure: Right hip constrained liner- posterior;  Surgeon: Durene Romans, MD;  Location: [facility];  Service: Orthopedics;  Laterality: Right;   ULNAR NERVE TRANSPOSITION Right    UPPER ESOPHAGEAL ENDOSCOPIC ULTRASOUND (EUS) N/A 10/19/2020   Procedure: UPPER ESOPHAGEAL ENDOSCOPIC ULTRASOUND (EUS);  Surgeon: Rachael Fee, MD;  Location: [facility];  Service: Endoscopy;  Laterality: N/A;  periduodenal lesion    SOCIAL HISTORY: Social History   Socioeconomic History   Marital status: Married    Spouse name: Not on file   Number of children: 2   Years of education: Not on file   Highest education level: Not on file  Occupational History   Occupation: disabled   Occupation: disabled  Tobacco Use   Smoking status: Every Day    Current packs/day: 2.00    Average packs/day: 2.0 packs/day for 46.0 years (92.0 ttl pk-yrs)    Types: Cigarettes   Smokeless tobacco: Never   Tobacco comments:    2 packs of cigarettes smoked daily 12/13/21- declines smoking cessation  Vaping Use   Vaping status: Never Used  Substance and Sexual Activity   Alcohol use: No   Drug use: No   Sexual activity: Not Currently    Partners: Male  Other Topics Concern   Not on file  Social History Narrative   Right handed    Caffeine~ 2 cups per day    Lives at home with husband (strained relationship)   Primary caretaker for disabled brother  who had aneurism   Daughter died 06-08-18   Social Determinants of Health   Financial Resource Strain: Low Risk  (05/16/2022)   Overall Financial Resource Strain (CARDIA)    Difficulty of Paying Living Expenses: Not hard at all  Food Insecurity: No Food Insecurity (05/16/2022)   Hunger Vital Sign    Worried About Running Out of Food in the Last Year: Never true    Ran Out of Food in the Last Year: Never true  Transportation Needs: No Transportation Needs (05/16/2022)   PRAPARE - Administrator, Civil Service (Medical): No    Lack of Transportation (Non-Medical): No  Physical Activity: Inactive (05/16/2022)   Exercise Vital Sign    Days of Exercise per Week: 0 days    Minutes of Exercise per Session: 0 min  Stress: Stress Concern Present (05/16/2022)   [employer] of Occupational Health - Occupational Stress Questionnaire  Feeling of Stress : To some extent  Social Connections: Moderately Isolated (05/16/2022)   Social Connection and Isolation Panel [NHANES]    Frequency of Communication with Friends and Family: More than three times a week    Frequency of Social Gatherings with Friends and Family: Once a week    Attends Religious Services: Never    Database administrator or Organizations: No    Attends Banker Meetings: Never    Marital Status: Married  Catering manager Violence: Not At Risk (05/16/2022)   Humiliation, Afraid, Rape, and Kick questionnaire    Fear of Current or Ex-Partner: No    Emotionally Abused: No    Physically Abused: No    Sexually Abused: No    FAMILY HISTORY: Family History  Problem Relation Age of Onset   COPD Mother    Heart disease Mother    Lung disease Father        Asbestosis   Heart attack Father    Heart disease Father    Cerebral aneurysm Brother    Aneurysm Brother        Brain   Drug abuse Daughter    Epilepsy Son    Alcohol abuse Son    Drug abuse Son    Arthritis Maternal Grandmother    Heart  disease Maternal Grandmother    Asthma Maternal Grandfather    Cancer Maternal Grandfather    Arthritis Paternal Grandmother    Heart disease Paternal Grandmother    Stroke Paternal Grandmother    Early death Paternal Grandfather    Heart disease Paternal Grandfather     ALLERGIES:  is allergic to metformin and related, nsaids, wellbutrin [bupropion], aleve [naproxen sodium], codeine, penicillins, and sulfonamide derivatives.  MEDICATIONS:  Current Outpatient Medications  Medication Sig Dispense Refill   albuterol (PROAIR HFA) 108 (90 Base) MCG/ACT inhaler 2 puffs every 4 hours as needed only  if your can't catch your breath 18 g 5   ALPRAZolam (XANAX) 1 MG tablet TAKE 1 TABLET BY MOUTH THREE TIMES DAILY AS NEEDED FOR ANXIETY 90 tablet 0   ARIPiprazole (ABILIFY) 2 MG tablet Take 1 tablet (2 mg total) by mouth at bedtime. Try 2 mg nightly (can do in AM)  x 2 weeks, and if it hasn't helped at all, increase to 4 mg/2 tabs nightly- for resistant depression. 60 tablet 5   atorvastatin (LIPITOR) 20 MG tablet TAKE 1 TABLET BY MOUTH AT BEDTIME 90 tablet 1   benzonatate (TESSALON) 100 MG capsule Take 1 capsule (100 mg total) by mouth 3 (three) times daily. 90 capsule 1   calcipotriene (DOVONOX) 0.005 % cream Apply to affected area bid when on break from steroid cream 120 g 3   cyclobenzaprine (FLEXERIL) 10 MG tablet Take 10 mg by mouth 3 (three) times daily as needed.     dicyclomine (BENTYL) 10 MG capsule Take 1 capsule (10 mg total) by mouth every 6 (six) hours as needed for spasms. 90 capsule 3   diphenoxylate-atropine (LOMOTIL) 2.5-0.025 MG tablet Take 1 tablet by mouth 4 (four) times daily as needed for diarrhea or loose stools. 60 tablet 0   DULoxetine (CYMBALTA) 60 MG capsule TAKE TWO CAPSULES BY MOUTH EVERY DAY 180 capsule 1   FEROSUL 325 (65 Fe) MG tablet TAKE 1 TABLET BY MOUTH DAILY WITH BREAKFAST 30 tablet 3   fluconazole (DIFLUCAN) 150 MG tablet Take 1 tablet po once. May repeat dose in  3 days prn persistent symptoms. 3 tablet 1  fluticasone (FLONASE) 50 MCG/ACT nasal spray INSTILL 2 SPRAYS IN EACH NOSTRIL EVERY DAY 16 g 6   Fluticasone-Umeclidin-Vilant (TRELEGY ELLIPTA) 200-62.5-25 MCG/ACT AEPB Inhale 1 puff into the lungs daily at 2 PM. 60 each 5   furosemide (LASIX) 20 MG tablet TAKE 1 TABLET BY MOUTH TWICE DAILY, IF SWELLING IMPROVES CAN HOLD MEDICATION 90 tablet 0   HYDROcodone-acetaminophen (NORCO) 10-325 MG tablet Take 1 tablet by mouth every 4 (four) hours as needed. 150 tablet 0   HYDROcodone-acetaminophen (NORCO) 10-325 MG tablet Take 1 tablet by mouth every 4 (four) hours as needed. To be filled 01/28/23 150 tablet 0   hydrOXYzine (ATARAX) 10 MG tablet TAKE 1 TABLET BY MOUTH THREE TIMES DAILY AS NEEDED FOR ITCHING 30 tablet 2   levothyroxine (SYNTHROID) 137 MCG tablet Take 1 tablet (137 mcg total) by mouth daily before breakfast. 90 tablet 3   loratadine (CLARITIN) 10 MG tablet Take 10 mg by mouth daily.     meloxicam (MOBIC) 15 MG tablet TAKE 1 TABLET BY MOUTH EVERY DAY 30 tablet 2   nystatin (MYCOSTATIN) 100000 UNIT/ML suspension Swish and swallow with 5 ml up to QID prn for thrush 400 mL 0   OLANZapine (ZYPREXA) 10 MG tablet Take 1 tablet (10 mg total) by mouth at bedtime. 30 tablet 2   omeprazole (PRILOSEC) 40 MG capsule TAKE ONE CAPSULE BY MOUTH TWICE DAILY 180 capsule 3   ondansetron (ZOFRAN) 8 MG tablet Take 1 tablet (8 mg total) by mouth 2 (two) times daily. 60 tablet 1   Pancrelipase, Lip-Prot-Amyl, (CREON PO) Take by mouth.     permethrin (ELIMITE) 5 % cream      potassium chloride SA (KLOR-CON M) 20 MEQ tablet TAKE 1 TABLET BY MOUTH TWICE DAILY 60 tablet 2   prochlorperazine (COMPAZINE) 10 MG tablet Take 1 tablet (10 mg total) by mouth every 6 (six) hours as needed for nausea or vomiting. 30 tablet 0   Rimegepant Sulfate (NURTEC) 75 MG TBDP Take 1 tablet (75 mg total) by mouth daily as needed (for migraine-). Take within 15 minutes of initial symptoms 8  tablet 5   topiramate (TOPAMAX) 100 MG tablet Take 1 tablet (100 mg total) by mouth at bedtime. Changed to 100 mg tabs- for ease 90 tablet 1   triamcinolone ointment (KENALOG) 0.5 % Apply 1 Application topically 3 (three) times daily. Apply thin-layer on affected areas, no more than 2 weeks consistently. 60 g 2   No current facility-administered medications for this visit.   Facility-Administered Medications Ordered in Other Visits  Medication Dose Route Frequency Provider Last Rate Last Admin   atezolizumab (TECENTRIQ) 1,200 mg in sodium chloride 0.9 % 250 mL chemo infusion  1,200 mg Intravenous Once Jeanie Sewer T IV, MD       heparin lock flush 100 unit/mL  500 Units Intracatheter Once PRN Ulysees Barns IV, MD       potassium chloride 10 mEq in 100 mL IVPB  10 mEq Intravenous Q1 Hr x 2 Johney Maine, MD 100 mL/hr at 12/24/22 1255 10 mEq at 12/24/22 1255   sodium chloride flush (NS) 0.9 % injection 10 mL  10 mL Intracatheter PRN Jaci Standard, MD        REVIEW OF SYSTEMS:   Constitutional: ( - ) fevers, ( - )  chills , ( - ) night sweats Eyes: ( - ) blurriness of vision, ( - ) double vision, ( - ) watery eyes Ears, nose, mouth, throat,  and face: ( - ) mucositis, ( - ) sore throat Respiratory: ( - ) cough, ( -) dyspnea, ( - ) wheezes Cardiovascular: ( - ) palpitation, ( - ) chest discomfort, ( - ) lower extremity swelling Gastrointestinal:  ( +) nausea, ( - ) heartburn, ( - ) change in bowel habits Skin: ( - ) abnormal skin rashes Lymphatics: ( - ) new lymphadenopathy, ( - ) easy bruising Neurological: (+ ) numbness, ( - ) tingling, ( - ) new weaknesses Behavioral/Psych: ( - ) mood change, ( - ) new changes  All other systems were reviewed with the patient and are negative.  PHYSICAL EXAMINATION: ECOG PERFORMANCE STATUS: 1 - Symptomatic but completely ambulatory  Vitals:   12/24/22 1047  BP: 126/83  Pulse: 100  Resp: 16  Temp: 98.1 F (36.7 C)  SpO2: 97%    Filed  Weights   12/24/22 1047  Weight: 173 lb 12.8 oz (78.8 kg)     GENERAL: Well-appearing middle-age Caucasian female, alert, no distress and comfortable SKIN: skin color, texture, turgor are normal, no rashes or significant lesions EYES: conjunctiva are pink and non-injected, sclera clear LUNGS:  normal breathing effort. Diffuse wheezing hear on ausculation.  HEART: regular rate & rhythm and no murmurs. Mild bilateral lower extremity edema Musculoskeletal: no cyanosis of digits and no clubbing  PSYCH: alert & oriented x 3, fluent speech NEURO: no focal motor/sensory deficits  LABORATORY DATA:  I have reviewed the data as listed    Latest Ref Rng & Units 12/24/2022   10:31 AM 11/13/2022    1:15 PM 10/23/2022   10:30 AM  CBC  WBC 4.0 - 10.5 K/uL 9.1  9.1  9.9   Hemoglobin 12.0 - 15.0 g/dL 16.1  09.6  04.5   Hematocrit 36.0 - 46.0 % 42.2  44.6  45.5   Platelets 150 - 400 K/uL 196  171  208        Latest Ref Rng & Units 12/24/2022   10:31 AM 11/13/2022    1:15 PM 10/23/2022   10:30 AM  CMP  Glucose 70 - 99 mg/dL 98  409  811   BUN 8 - 23 mg/dL 12  12  15    Creatinine 0.44 - 1.00 mg/dL 9.14  7.82  9.56   Sodium 135 - 145 mmol/L 126  138  137   Potassium 3.5 - 5.1 mmol/L 2.7  3.3  4.0   Chloride 98 - 111 mmol/L 88  102  104   CO2 22 - 32 mmol/L 27  26  25    Calcium 8.9 - 10.3 mg/dL 8.7  8.8  8.8   Total Protein 6.5 - 8.1 g/dL 6.5  6.3  6.3   Total Bilirubin <1.2 mg/dL 0.5  0.3  0.4   Alkaline Phos 38 - 126 U/L 67  69  74   AST 15 - 41 U/L 15  12  13    ALT 0 - 44 U/L 7  8  9      No results found for: "MPROTEIN" Lab Results  Component Value Date   KPAFRELGTCHN 0.75 06/30/2014   LAMBDASER 3.78 (H) 06/30/2014   KAPLAMBRATIO 0.20 (L) 06/30/2014     RADIOGRAPHIC STUDIES: No results found.  ASSESSMENT & PLAN [name] 67 y.o. female with medical history significant for extensive stage small cell lung cancer who presents for a follow up visit.   After review of the  labs, review of the records, and discussion with the patient the  patients findings are most consistent with extensive stage small cell lung cancer with metastasis from the right lower lobe to the lymph nodes of the abdomen.  At this time we will pursue triple therapy with carboplatin, etoposide, and atezolizumab.  After 4 cycles we will convert to maintenance atezolizumab alone.  We previously discussed the risks and benefits of this therapy and the patient was in agreement to proceed with this treatment.  The treatment of choice consist of carboplatin, etoposide, and atezolizumab.  The regimen consists of carboplatin AUC of 5 IV on day 1, etoposide 100 mg per metered squared IV on day 1, 2, and 3 and atezolizumab 1200 mg on day 1.  This continues for 21-day cycles.  After 4 cycles the patient proceeds with atezolizumab maintenance therapy alone.    # Small Cell Lung Cancer, Extensive Stage -- MRI of the brain shows no evidence of intracranial spread --Findings are currently consistent with metastatic small cell lung cancer with metastatic spread to the lymph nodes of the abdomen -- Started carboplatin, etoposide, and atezolizumab on 11/13/2020. Transitioned to maintenance Atezolizumab on 02/22/2021.  Plan: --today is Cycle 33 Day 1 of Atezolizumab maintenance.  --labs today were reviewed and adequate for treatment. Hgb 15.1, white blood cell 9.1, MCV 88.7, platelets 196. Creatinine stable at 1.18. LFTs in range.  -- Most recent CT CAP from 10/21/2022 which shows no evidence of recurrence. Repeat scan due in December 2024.  --Proceed with treatment without any dose modifications.  --RTC in 3 weeks for a follow up before Cycle 34  #Dysuria #Urinary urgency --urinary symptoms resolved.  --Sent cipro 250 mg BID x 5 days at last visit.   # Early Cirrhosis --noted on MR abdomen from 05/22/2022. [place] with elastography performed on 06/18/2022. --continue to follow with GI.   #Right renal lesion: --Noted  to be benign on most recent MRI of the abdomen.  #Diarrhea-improved --stool sent for C. Diff and GI pathology panel, all negative --patient following with GI and found to have pancreatic insufficiency and started on creon with improvement of symptoms.   #Nausea: --Symptoms improved with zofran and olanzapine.  --Added IV zofran to infusion premeds for better symptom control.   #Hypokalemia:  --currently taking potassium chloride 20 meq BID.  Will administer IV potassium today. --potassium level is still 2.7 today. Continue PO supplementation.   #Lower extremity edema --Currently takes lasix 20 mg 1-2 times a day. --Stable. Continue to monitor.   #Neuropathy involving fingers/feet: --Patient currently takes Cymbalta   #Supportive Care -- chemotherapy education complete -- port placed -- zofran 8mg  q8H PRN and compazine 10mg  PO q6H for nausea -- EMLA cream for port  Orders Placed This Encounter  Procedures   CT CHEST ABDOMEN PELVIS W CONTRAST    Standing Status:   Future    Standing Expiration Date:   12/24/2023    Order Specific Question:   If indicated for the ordered procedure, I authorize the administration of contrast media per Radiology protocol    Answer:   Yes    Order Specific Question:   Does the patient have a contrast media/X-ray dye allergy?    Answer:   No    Order Specific Question:   Preferred imaging location?    Answer:   [facility]    Order Specific Question:   If indicated for the ordered procedure, I authorize the administration of oral contrast media per Radiology protocol    Answer:   Yes   TSH  Standing Status:   Future    Standing Expiration Date:   02/25/2024   T4    Standing Status:   Future    Standing Expiration Date:   02/25/2024   CBC with Differential (Cancer Center Only)    Standing Status:   Future    Standing Expiration Date:   02/25/2024   CMP (Cancer Center only)    Standing Status:   Future    Standing Expiration Date:    02/25/2024   TSH    Standing Status:   Future    Standing Expiration Date:   03/17/2024   T4    Standing Status:   Future    Standing Expiration Date:   03/17/2024   CBC with Differential (Cancer Center Only)    Standing Status:   Future    Standing Expiration Date:   03/17/2024   CMP (Cancer Center only)    Standing Status:   Future    Standing Expiration Date:   03/17/2024   All questions were answered. The patient knows to call the clinic with any problems, questions or concerns.  I have spent a total of 30 minutes minutes of face-to-face and non-face-to-face time, preparing to see the patient, performing a medically appropriate examination, counseling and educating the patient, ordering medications/tests, documenting clinical information in the electronic health record, and care coordination.   Ulysees Barns, MD Department of Hematology/Oncology [facility] [place] [facility] Phone: [phone] Pager: [phone] Email: [email or web address]  12/24/2022 1:28 PM

## 2022-12-25 ENCOUNTER — Encounter: Payer: 59 | Admitting: Dietician

## 2022-12-25 ENCOUNTER — Other Ambulatory Visit: Payer: 59

## 2022-12-25 ENCOUNTER — Ambulatory Visit: Payer: 59 | Admitting: Hematology and Oncology

## 2022-12-25 ENCOUNTER — Other Ambulatory Visit (HOSPITAL_BASED_OUTPATIENT_CLINIC_OR_DEPARTMENT_OTHER): Payer: Self-pay | Admitting: Pulmonary Disease

## 2022-12-25 ENCOUNTER — Ambulatory Visit: Payer: 59

## 2022-12-25 LAB — T4: T4, Total: 11.4 ug/dL (ref 4.5–12.0)

## 2022-12-28 ENCOUNTER — Other Ambulatory Visit: Payer: Self-pay | Admitting: Physician Assistant

## 2022-12-30 ENCOUNTER — Telehealth: Payer: Self-pay | Admitting: *Deleted

## 2022-12-30 NOTE — Telephone Encounter (Signed)
Received call from pt. She states that she had a rough time after her last treatment. She states she experienced dizziness and some confusion for about 3 days.  She took some of her husband's Meclizine which did help her dizziness. She is asking if she has this again, is it ok to take meclizine again.  Discussed with Dr. Leonides Schanz. He is agreeable to prescribing Meclizine for [place].  TCT patient to advise that Dr. Leonides Schanz will send in a prescription for her own meclizine. Pt voiced understanding.

## 2022-12-31 DIAGNOSIS — H2513 Age-related nuclear cataract, bilateral: Secondary | ICD-10-CM | POA: Diagnosis not present

## 2022-12-31 DIAGNOSIS — H04123 Dry eye syndrome of bilateral lacrimal glands: Secondary | ICD-10-CM | POA: Diagnosis not present

## 2022-12-31 DIAGNOSIS — H353221 Exudative age-related macular degeneration, left eye, with active choroidal neovascularization: Secondary | ICD-10-CM | POA: Diagnosis not present

## 2022-12-31 DIAGNOSIS — H353112 Nonexudative age-related macular degeneration, right eye, intermediate dry stage: Secondary | ICD-10-CM | POA: Diagnosis not present

## 2022-12-31 LAB — HM DIABETES EYE EXAM

## 2023-01-01 ENCOUNTER — Other Ambulatory Visit: Payer: Self-pay

## 2023-01-01 ENCOUNTER — Encounter (HOSPITAL_COMMUNITY): Payer: Self-pay

## 2023-01-01 ENCOUNTER — Encounter: Payer: Self-pay | Admitting: Physician Assistant

## 2023-01-01 ENCOUNTER — Emergency Department (HOSPITAL_COMMUNITY)
Admission: EM | Admit: 2023-01-01 | Discharge: 2023-01-01 | Disposition: A | Discharge status: Home / Self Care | Payer: 59 | Attending: Emergency Medicine | Admitting: Emergency Medicine

## 2023-01-01 ENCOUNTER — Emergency Department (HOSPITAL_COMMUNITY): Payer: 59

## 2023-01-01 ENCOUNTER — Ambulatory Visit: Payer: 59 | Admitting: Physician Assistant

## 2023-01-01 VITALS — BP 114/76 | HR 70 | Temp 97.8°F | Wt 174.0 lb

## 2023-01-01 DIAGNOSIS — R0781 Pleurodynia: Secondary | ICD-10-CM

## 2023-01-01 DIAGNOSIS — W19XXXA Unspecified fall, initial encounter: Secondary | ICD-10-CM | POA: Diagnosis not present

## 2023-01-01 DIAGNOSIS — M25512 Pain in left shoulder: Secondary | ICD-10-CM | POA: Diagnosis not present

## 2023-01-01 DIAGNOSIS — M25522 Pain in left elbow: Secondary | ICD-10-CM | POA: Insufficient documentation

## 2023-01-01 DIAGNOSIS — M19012 Primary osteoarthritis, left shoulder: Secondary | ICD-10-CM | POA: Diagnosis not present

## 2023-01-01 DIAGNOSIS — M1612 Unilateral primary osteoarthritis, left hip: Secondary | ICD-10-CM | POA: Diagnosis not present

## 2023-01-01 DIAGNOSIS — M25712 Osteophyte, left shoulder: Secondary | ICD-10-CM | POA: Diagnosis not present

## 2023-01-01 DIAGNOSIS — R42 Dizziness and giddiness: Secondary | ICD-10-CM

## 2023-01-01 DIAGNOSIS — M25552 Pain in left hip: Secondary | ICD-10-CM

## 2023-01-01 DIAGNOSIS — R4781 Slurred speech: Secondary | ICD-10-CM | POA: Diagnosis not present

## 2023-01-01 DIAGNOSIS — R531 Weakness: Secondary | ICD-10-CM | POA: Diagnosis not present

## 2023-01-01 DIAGNOSIS — Z96641 Presence of right artificial hip joint: Secondary | ICD-10-CM | POA: Diagnosis not present

## 2023-01-01 MED ORDER — DICLOFENAC EPOLAMINE 1.3 % EX PTCH
1.0000 | MEDICATED_PATCH | Freq: Two times a day (BID) | CUTANEOUS | Status: DC
  Filled 2023-01-01: qty 1

## 2023-01-01 MED ORDER — HYDROCODONE-ACETAMINOPHEN 5-325 MG PO TABS
1.0000 | ORAL_TABLET | Freq: Once | ORAL | Status: AC
  Administered 2023-01-01: 1 via ORAL
  Filled 2023-01-01: qty 1

## 2023-01-01 NOTE — Progress Notes (Signed)
[name] is a 67 y.o. female here for a new problem.  History of Present Illness:   Chief Complaint  Patient presents with   Fall    Fall this morning hurt left hip, rib pain    Left hip pain/Left lower rib pain/Fall She is accompanied by her husband today. She fell after waking up at 3am this morning to use the bathroom and experiencing a spinning sensation. Her floors had just been cleaned and were somewhat slippery. Her husband found her lying on her back. Believes she may have struck her head. Denies loss of consciousness. Denies vomiting/nausea. She is having pain in her left hip, left rib cage that she rates as a 10-11/10. History of small cell carcinoma RLL and seen by Dr. Leonides Schanz. She is receiving chemotherapy every 3 weeks. She has had prior episodes of dizziness while on this treatment but it has been getting worse. History of total right hip arthroplasty, multiple right hip revisions, reductions. Reports she has not had a fall in over a year. She uses a walker as needed. She is taking Norco 10-325 mg every four hours as needed for pain. She has taken one dose of this today.   Slurred Speech/Left-Sided Weakness/Dizziness Patient was also briefly evaluated by her Primary Care Provider (PCP) today who expresses concern for patient's speech Patient denies known changes in speech, weakness in arms Husband poor historian   Past Medical History:  Diagnosis Date   Allergic rhinitis    Anemia    Anxiety    Chicken pox    Chronic back pain    COPD (chronic obstructive pulmonary disease) (HCC)    Depression    DM (diabetes mellitus) (HCC)    Essential hypertension    GERD (gastroesophageal reflux disease)    Headache    migraines   History of gastritis    EGD 2015   History of home oxygen therapy    2 liters at hs last 6 months   Hyperlipidemia    Hypothyroidism    Migraines    Osteoarthritis    oa   Scoliosis    Small cell lung cancer (HCC)    Stage 4      Social History   Tobacco Use   Smoking status: Every Day    Current packs/day: 2.00    Average packs/day: 2.0 packs/day for 46.0 years (92.0 ttl pk-yrs)    Types: Cigarettes   Smokeless tobacco: Never   Tobacco comments:    2 packs of cigarettes smoked daily 12/13/21- declines smoking cessation  Vaping Use   Vaping status: Never Used  Substance Use Topics   Alcohol use: No   Drug use: No    Past Surgical History:  Procedure Laterality Date   APPENDECTOMY     1985   BIOPSY  07/24/2017   Procedure: BIOPSY;  Surgeon: Rachael Fee, MD;  Location: [facility];  Service: Endoscopy;;   CARDIAC CATHETERIZATION N/A 10/31/2015   Procedure: Left Heart Cath and Coronary Angiography;  Surgeon: Marykay Lex, MD;  Location: [facility] INVASIVE CV LAB;  Service: Cardiovascular;  Laterality: N/A;   CARPAL TUNNEL RELEASE Left    CARPAL TUNNEL RELEASE Right    CHOLECYSTECTOMY  late 1980's   COLONOSCOPY WITH PROPOFOL N/A 07/24/2017   Procedure: COLONOSCOPY WITH PROPOFOL;  Surgeon: Rachael Fee, MD;  Location: [facility];  Service: Endoscopy;  Laterality: N/A;   ESOPHAGOGASTRODUODENOSCOPY N/A 07/24/2017   Procedure: ESOPHAGOGASTRODUODENOSCOPY (EGD);  Surgeon: Rachael Fee, MD;  Location:  [facility];  Service: Endoscopy;  Laterality: N/A;   ESOPHAGOGASTRODUODENOSCOPY (EGD) WITH PROPOFOL N/A 10/19/2020   Procedure: ESOPHAGOGASTRODUODENOSCOPY (EGD) WITH PROPOFOL;  Surgeon: Rachael Fee, MD;  Location: [facility];  Service: Endoscopy;  Laterality: N/A;   FINE NEEDLE ASPIRATION N/A 10/19/2020   Procedure: FINE NEEDLE ASPIRATION (FNA) LINEAR;  Surgeon: Rachael Fee, MD;  Location: [facility];  Service: Endoscopy;  Laterality: N/A;   GALLBLADDER SURGERY  1991   HIP CLOSED REDUCTION Right 01/08/2016   Procedure: CLOSED MANIPULATION HIP;  Surgeon: Jene Every, MD;  Location: [facility];  Service: Orthopedics;  Laterality: Right;   HIP CLOSED REDUCTION Right 01/19/2016   Procedure:  ATTEMPTED CLOSED REDUCTION RIGHT HIP;  Surgeon: Toni Arthurs, MD;  Location: [facility];  Service: Orthopedics;  Laterality: Right;   HIP CLOSED REDUCTION Right 01/20/2016   Procedure: CLOSED REDUCTION RIGHT TOTAL HIP;  Surgeon: Durene Romans, MD;  Location: [facility];  Service: Orthopedics;  Laterality: Right;   HIP CLOSED REDUCTION Right 02/17/2016   Procedure: CLOSED REDUCTION RIGHT TOTAL HIP;  Surgeon: Samson Frederic, MD;  Location: [facility];  Service: Orthopedics;  Laterality: Right;   HIP CLOSED REDUCTION Right 02/28/2016   Procedure: CLOSED REDUCTION HIP;  Surgeon: Yolonda Kida, MD;  Location: [facility];  Service: Orthopedics;  Laterality: Right;   IR IMAGING GUIDED PORT INSERTION  11/01/2020   POLYPECTOMY  07/24/2017   Procedure: POLYPECTOMY;  Surgeon: Rachael Fee, MD;  Location: [facility];  Service: Endoscopy;;   TONSILLECTOMY     TOTAL ABDOMINAL HYSTERECTOMY     1985, with 1 ovary removed and 2 nd ovary removed 2003   TOTAL HIP ARTHROPLASTY Right    Original surgery 2006 with revision 2010   TOTAL HIP REVISION Right 01/01/2016   Procedure: TOTAL HIP REVISION;  Surgeon: Durene Romans, MD;  Location: [facility];  Service: Orthopedics;  Laterality: Right;   TOTAL HIP REVISION Right 03/02/2016   Procedure: TOTAL HIP REVISION;  Surgeon: Durene Romans, MD;  Location: [facility];  Service: Orthopedics;  Laterality: Right;   TOTAL HIP REVISION Right 09/02/2016   Procedure: Right hip constrained liner- posterior;  Surgeon: Durene Romans, MD;  Location: [facility];  Service: Orthopedics;  Laterality: Right;   ULNAR NERVE TRANSPOSITION Right    UPPER ESOPHAGEAL ENDOSCOPIC ULTRASOUND (EUS) N/A 10/19/2020   Procedure: UPPER ESOPHAGEAL ENDOSCOPIC ULTRASOUND (EUS);  Surgeon: Rachael Fee, MD;  Location: [facility];  Service: Endoscopy;  Laterality: N/A;  periduodenal lesion    Family History  Problem Relation Age of Onset   COPD Mother    Heart disease Mother    Lung disease Father        Asbestosis    Heart attack Father    Heart disease Father    Cerebral aneurysm Brother    Aneurysm Brother        Brain   Drug abuse Daughter    Epilepsy Son    Alcohol abuse Son    Drug abuse Son    Arthritis Maternal Grandmother    Heart disease Maternal Grandmother    Asthma Maternal Grandfather    Cancer Maternal Grandfather    Arthritis Paternal Grandmother    Heart disease Paternal Grandmother    Stroke Paternal Grandmother    Early death Paternal Grandfather    Heart disease Paternal Grandfather     Allergies  Allergen Reactions   Metformin And Related Diarrhea   Nsaids Diarrhea   Wellbutrin [Bupropion] Other (See Comments)    Makes her  too sleepy    Aleve [Naproxen Sodium] Other (See Comments)    Headache    Codeine Nausea Only and Other (See Comments)    GI upset   Penicillins Nausea Only and Other (See Comments)    GI upset    Sulfonamide Derivatives Hives    Current Medications:   Current Outpatient Medications:    albuterol (PROAIR HFA) 108 (90 Base) MCG/ACT inhaler, 2 puffs every 4 hours as needed only  if your can't catch your breath, Disp: 18 g, Rfl: 5   ALPRAZolam (XANAX) 1 MG tablet, TAKE 1 TABLET BY MOUTH THREE TIMES DAILY AS NEEDED FOR ANXIETY, Disp: 90 tablet, Rfl: 0   ARIPiprazole (ABILIFY) 2 MG tablet, Take 1 tablet (2 mg total) by mouth at bedtime. Try 2 mg nightly (can do in AM)  x 2 weeks, and if it hasn't helped at all, increase to 4 mg/2 tabs nightly- for resistant depression., Disp: 60 tablet, Rfl: 5   atorvastatin (LIPITOR) 20 MG tablet, TAKE 1 TABLET BY MOUTH AT BEDTIME, Disp: 90 tablet, Rfl: 1   calcipotriene (DOVONOX) 0.005 % cream, Apply to affected area bid when on break from steroid cream, Disp: 120 g, Rfl: 3   cyclobenzaprine (FLEXERIL) 10 MG tablet, Take 10 mg by mouth 3 (three) times daily as needed., Disp: , Rfl:    dicyclomine (BENTYL) 10 MG capsule, Take 1 capsule (10 mg total) by mouth every 6 (six) hours as needed for spasms., Disp: 90  capsule, Rfl: 3   diphenoxylate-atropine (LOMOTIL) 2.5-0.025 MG tablet, Take 1 tablet by mouth 4 (four) times daily as needed for diarrhea or loose stools., Disp: 60 tablet, Rfl: 0   DULoxetine (CYMBALTA) 60 MG capsule, TAKE TWO CAPSULES BY MOUTH EVERY DAY, Disp: 180 capsule, Rfl: 1   FEROSUL 325 (65 Fe) MG tablet, TAKE 1 TABLET BY MOUTH DAILY WITH BREAKFAST, Disp: 30 tablet, Rfl: 3   fluconazole (DIFLUCAN) 150 MG tablet, Take 1 tablet po once. May repeat dose in 3 days prn persistent symptoms., Disp: 3 tablet, Rfl: 1   fluticasone (FLONASE) 50 MCG/ACT nasal spray, INSTILL 2 SPRAYS IN EACH NOSTRIL EVERY DAY, Disp: 16 g, Rfl: 6   furosemide (LASIX) 20 MG tablet, TAKE 1 TABLET BY MOUTH TWICE DAILY, IF SWELLING IMPROVES CAN HOLD MEDICATION, Disp: 90 tablet, Rfl: 0   HYDROcodone-acetaminophen (NORCO) 10-325 MG tablet, Take 1 tablet by mouth every 4 (four) hours as needed., Disp: 150 tablet, Rfl: 0   HYDROcodone-acetaminophen (NORCO) 10-325 MG tablet, Take 1 tablet by mouth every 4 (four) hours as needed. To be filled 01/28/23, Disp: 150 tablet, Rfl: 0   hydrOXYzine (ATARAX) 10 MG tablet, TAKE 1 TABLET BY MOUTH THREE TIMES DAILY AS NEEDED FOR ITCHING, Disp: 30 tablet, Rfl: 2   levothyroxine (SYNTHROID) 137 MCG tablet, Take 1 tablet (137 mcg total) by mouth daily before breakfast., Disp: 90 tablet, Rfl: 3   loratadine (CLARITIN) 10 MG tablet, Take 10 mg by mouth daily., Disp: , Rfl:    meloxicam (MOBIC) 15 MG tablet, TAKE 1 TABLET BY MOUTH EVERY DAY, Disp: 30 tablet, Rfl: 2   nystatin (MYCOSTATIN) 100000 UNIT/ML suspension, Swish and swallow with 5 ml up to QID prn for thrush, Disp: 400 mL, Rfl: 0   OLANZapine (ZYPREXA) 10 MG tablet, Take 1 tablet (10 mg total) by mouth at bedtime., Disp: 30 tablet, Rfl: 2   omeprazole (PRILOSEC) 40 MG capsule, TAKE ONE CAPSULE BY MOUTH TWICE DAILY, Disp: 180 capsule, Rfl: 3   ondansetron (ZOFRAN)  8 MG tablet, Take 1 tablet (8 mg total) by mouth 2 (two) times daily., Disp:  60 tablet, Rfl: 1   Pancrelipase, Lip-Prot-Amyl, (CREON PO), Take by mouth., Disp: , Rfl:    permethrin (ELIMITE) 5 % cream, , Disp: , Rfl:    potassium chloride SA (KLOR-CON M) 20 MEQ tablet, TAKE 1 TABLET BY MOUTH TWICE DAILY, Disp: 60 tablet, Rfl: 2   prochlorperazine (COMPAZINE) 10 MG tablet, Take 1 tablet (10 mg total) by mouth every 6 (six) hours as needed for nausea or vomiting., Disp: 30 tablet, Rfl: 0   Rimegepant Sulfate (NURTEC) 75 MG TBDP, Take 1 tablet (75 mg total) by mouth daily as needed (for migraine-). Take within 15 minutes of initial symptoms, Disp: 8 tablet, Rfl: 5   topiramate (TOPAMAX) 100 MG tablet, Take 1 tablet (100 mg total) by mouth at bedtime. Changed to 100 mg tabs- for ease, Disp: 90 tablet, Rfl: 1   TRELEGY ELLIPTA 200-62.5-25 MCG/ACT AEPB, INHALE 1 PUFFS into lungs DAILY AT 2pm, Disp: 60 each, Rfl: 5   triamcinolone ointment (KENALOG) 0.5 %, Apply 1 Application topically 3 (three) times daily. Apply thin-layer on affected areas, no more than 2 weeks consistently., Disp: 60 g, Rfl: 2   benzonatate (TESSALON) 100 MG capsule, Take 1 capsule (100 mg total) by mouth 3 (three) times daily. (Patient not taking: Reported on 01/01/2023), Disp: 90 capsule, Rfl: 1   Review of Systems:   Review of Systems  Constitutional:  Negative for fever and malaise/fatigue.  HENT:  Negative for congestion.   Eyes:  Negative for blurred vision.  Respiratory:  Negative for cough and shortness of breath.   Cardiovascular:  Negative for chest pain, palpitations and leg swelling.  Gastrointestinal:  Negative for vomiting.  Musculoskeletal:  Positive for joint pain (Left hip). Negative for back pain.  Skin:  Negative for rash.  Neurological:  Positive for dizziness (Spinning sensation). Negative for loss of consciousness and headaches.    Vitals:   Vitals:   01/01/23 1146  BP: 114/76  Pulse: 70  Temp: 97.8 F (36.6 C)  TempSrc: Temporal  SpO2: 99%  Weight: 174 lb (78.9 kg)      Body mass index is 31.83 kg/m.  Physical Exam:   Physical Exam Vitals and nursing note reviewed.  Constitutional:      General: She is not in acute distress.    Appearance: She is well-developed. She is not ill-appearing or toxic-appearing.  Cardiovascular:     Rate and Rhythm: Normal rate and regular rhythm.     Pulses: Normal pulses.     Heart sounds: Normal heart sounds, S1 normal and S2 normal.  Pulmonary:     Effort: Pulmonary effort is normal.     Breath sounds: Normal breath sounds.  Musculoskeletal:     Comments: Left leg with slight internal rotation Pain elicited with bearing weight on left leg Tenderness to palpation to left hip  Tenderness to palpation to left anterior lower rib cage  Skin:    General: Skin is warm and dry.  Neurological:     Mental Status: She is alert.     GCS: GCS eye subscore is 4. GCS verbal subscore is 5. GCS motor subscore is 6.     Cranial Nerves: Facial asymmetry present.     Comments: Slight weakness to left grip compared to right  Psychiatric:        Speech: Speech normal.        Behavior: Behavior normal. Behavior is  cooperative.     Assessment and Plan:   Left hip pain Discussed need for urgent imaging and better pain control -- patient advised to go to the ER for further evaluation and mgmt  Rib pain on left side Likely rib fracture Recommend imaging at emergency room  Will need escalation of pain medications No red flags with breathing on my exam  Fall, initial encounter; Dizziness; Slurred speech; Left-sided weakness PCP briefly evaluated patient and states that her speech and facial symmetry is not at her baseline Recommend ER for further evaluation and mgmt  I,Alexander Ruley,acting as a scribe for [employer], PA.,have documented all relevant documentation on the behalf of Jarold Motto, PA,as directed by  Jarold Motto, PA while in the presence of Jarold Motto, [place].  I, Jarold Motto, [place], have reviewed  all documentation for this visit. The documentation on 01/01/23 for the exam, diagnosis, procedures, and orders are all accurate and complete.  I spent a total of 52 minutes on this visit, today 01/01/23, which included reviewing previous notes from oncology, PCP, discussing plan of care with PCP, discussing plan of care with patient and using shared-decision making on next steps, and documenting the findings in the note.  Jarold Motto, PA-C

## 2023-01-01 NOTE — Discharge Instructions (Signed)
As discussed, your evaluation today has been largely reassuring.  But, it is important that you monitor your condition carefully, and do not hesitate to return to the ED if you develop new, or concerning changes in your condition. ? ?Otherwise, please follow-up with your physician for appropriate ongoing care. ? ?

## 2023-01-01 NOTE — ED Triage Notes (Signed)
Patient is here for evaluation after a fall. Pt reports getting out of bed at 0300 to go to the bathroom, got dizzy and fell. Denies any LOC. Reports that she hit her left elbow, left ribs and left hip. Reports pain in the elbow, ribs and hip. Reports she took her oxycodone around 0900 and it did not help any with her pain.

## 2023-01-01 NOTE — ED Provider Notes (Signed)
[name] EMERGENCY DEPARTMENT AT [facility] Provider Note   CSN: [id number] Arrival date & time: 01/01/23  1251     History  Chief Complaint  Patient presents with   Marletta Lor    [name] is a 67 y.o. female.  HPI Patient accompanied by her husband presents after a fall.  She notes that she stood up suddenly, felt lightheaded, went to the ground.  No complete loss of consciousness.  Since that time she has had pain in her left ribs, elbow, shoulder, hip.  No relief with oxycodone.  No new weakness in any extremity.    Home Medications Prior to Admission medications   Medication Sig Start Date End Date Taking? Authorizing Provider  albuterol (PROAIR HFA) 108 (90 Base) MCG/ACT inhaler 2 puffs every 4 hours as needed only  if your can't catch your breath 06/18/22   Luciano Cutter, MD  ALPRAZolam [name]) 1 MG tablet TAKE 1 TABLET BY MOUTH THREE TIMES DAILY AS NEEDED FOR ANXIETY 12/02/22   Jarold Motto, PA  ARIPiprazole (ABILIFY) 2 MG tablet Take 1 tablet (2 mg total) by mouth at bedtime. Try 2 mg nightly (can do in AM)  x 2 weeks, and if it hasn't helped at all, increase to 4 mg/2 tabs nightly- for resistant depression. 12/13/22   Lovorn, Aundra Millet, MD  atorvastatin (LIPITOR) 20 MG tablet TAKE 1 TABLET BY MOUTH AT BEDTIME 07/25/22   Allwardt, Alyssa M, PA-C  benzonatate (TESSALON) 100 MG capsule Take 1 capsule (100 mg total) by mouth 3 (three) times daily. Patient not taking: Reported on 01/01/2023 03/27/22   Jaci Standard, MD  calcipotriene (DOVONOX) 0.005 % cream Apply to affected area bid when on break from steroid cream 08/13/22   Terri Piedra, DO  cyclobenzaprine (FLEXERIL) 10 MG tablet Take 10 mg by mouth 3 (three) times daily as needed. 02/12/22   [provider]  dicyclomine (BENTYL) 10 MG capsule Take 1 capsule (10 mg total) by mouth every 6 (six) hours as needed for spasms. 09/12/22   Armbruster, Willaim Rayas, MD  diphenoxylate-atropine (LOMOTIL)  2.5-0.025 MG tablet Take 1 tablet by mouth 4 (four) times daily as needed for diarrhea or loose stools. 04/10/22   Jaci Standard, MD  DULoxetine (CYMBALTA) 60 MG capsule TAKE TWO CAPSULES BY MOUTH EVERY DAY 07/30/22   Lovorn, Aundra Millet, MD  FEROSUL 325 (65 Fe) MG tablet TAKE 1 TABLET BY MOUTH DAILY WITH BREAKFAST 10/11/22   Jaci Standard, MD  fluconazole (DIFLUCAN) 150 MG tablet Take 1 tablet po once. May repeat dose in 3 days prn persistent symptoms. 10/10/22   Jaci Standard, MD  fluticasone (FLONASE) 50 MCG/ACT nasal spray INSTILL 2 SPRAYS IN EACH NOSTRIL EVERY DAY 08/05/22   Allwardt, Alyssa M, PA-C  furosemide (LASIX) 20 MG tablet TAKE 1 TABLET BY MOUTH TWICE DAILY, IF SWELLING IMPROVES CAN HOLD MEDICATION 12/22/22   Pollyann Samples, NP  HYDROcodone-acetaminophen (NORCO) 10-325 MG tablet Take 1 tablet by mouth every 4 (four) hours as needed. 12/13/22   Lovorn, Aundra Millet, MD  HYDROcodone-acetaminophen (NORCO) 10-325 MG tablet Take 1 tablet by mouth every 4 (four) hours as needed. To be filled 01/28/23 12/13/22   Lovorn, Aundra Millet, MD  hydrOXYzine (ATARAX) 10 MG tablet TAKE 1 TABLET BY MOUTH THREE TIMES DAILY AS NEEDED FOR ITCHING 12/30/22   Allwardt, Crist Infante, PA-C  levothyroxine (SYNTHROID) 137 MCG tablet Take 1 tablet (137 mcg total) by mouth daily before breakfast. 12/13/22  Lovorn, Aundra Millet, MD  loratadine (CLARITIN) 10 MG tablet Take 10 mg by mouth daily.    [provider]  meloxicam (MOBIC) 15 MG tablet TAKE 1 TABLET BY MOUTH EVERY DAY 11/20/22   Allwardt, Alyssa M, PA-C  nystatin (MYCOSTATIN) 100000 UNIT/ML suspension Swish and swallow with 5 ml up to QID prn for thrush 09/10/22   Boscia, Heather E, NP  OLANZapine (ZYPREXA) 10 MG tablet Take 1 tablet (10 mg total) by mouth at bedtime. 09/10/22   Carlean Jews, NP  omeprazole (PRILOSEC) 40 MG capsule TAKE ONE CAPSULE BY MOUTH TWICE DAILY 03/07/22   Arnaldo Natal, NP  ondansetron (ZOFRAN) 8 MG tablet Take 1 tablet (8 mg total) by  mouth 2 (two) times daily. 08/22/22   Jaci Standard, MD  Pancrelipase, Lip-Prot-Amyl, (CREON PO) Take by mouth.    [provider]  permethrin (ELIMITE) 5 % cream  06/03/22   [provider]  potassium chloride SA (KLOR-CON M) 20 MEQ tablet TAKE 1 TABLET BY MOUTH TWICE DAILY 10/18/22   Jaci Standard, MD  prochlorperazine (COMPAZINE) 10 MG tablet Take 1 tablet (10 mg total) by mouth every 6 (six) hours as needed for nausea or vomiting. 10/27/20   Jaci Standard, MD  Rimegepant Sulfate (NURTEC) 75 MG TBDP Take 1 tablet (75 mg total) by mouth daily as needed (for migraine-). Take within 15 minutes of initial symptoms 06/05/22   Lovorn, Aundra Millet, MD  topiramate (TOPAMAX) 100 MG tablet Take 1 tablet (100 mg total) by mouth at bedtime. Changed to 100 mg tabs- for ease 12/03/22   Genice Rouge, MD  Harrel Carina ELLIPTA 200-62.5-25 MCG/ACT AEPB INHALE 1 PUFFS into lungs DAILY AT 2pm 12/27/22   Luciano Cutter, MD  triamcinolone ointment (KENALOG) 0.5 % Apply 1 Application topically 3 (three) times daily. Apply thin-layer on affected areas, no more than 2 weeks consistently. 04/15/22   Allwardt, Crist Infante, PA-C      Allergies    Metformin and related, Nsaids, Wellbutrin [bupropion], Aleve [naproxen sodium], Codeine, Penicillins, and Sulfonamide derivatives    Review of Systems   Review of Systems  Physical Exam Updated Vital Signs BP 134/88 (BP Location: Left Arm)   Pulse 95   Temp 97.7 F (36.5 C) (Oral)   Resp 18   Ht 5\' 2"  (1.575 m)   Wt 78.9 kg   SpO2 97%   BMI 31.81 kg/m  Physical Exam Vitals and nursing note reviewed.  Constitutional:      General: She is not in acute distress.    Appearance: She is well-developed.  HENT:     Head: Normocephalic and atraumatic.  Eyes:     Conjunctiva/sclera: Conjunctivae normal.  Cardiovascular:     Rate and Rhythm: Normal rate and regular rhythm.  Pulmonary:     Effort: Pulmonary effort is normal. No respiratory distress.      Breath sounds: Normal breath sounds. No stridor.  Abdominal:     General: There is no distension.  Musculoskeletal:     Comments: Patient describes pain with range of motion exercises of her left hip, elbow, but has no limit in these.  No deformities, mild tenderness palpation.  Skin:    General: Skin is warm and dry.  Neurological:     Mental Status: She is alert and oriented to person, place, and time.     Cranial Nerves: No cranial nerve deficit.  Psychiatric:        Mood and Affect: Mood  normal.     ED Results / Procedures / Treatments   Labs (all labs ordered are listed, but only abnormal results are displayed) Labs Reviewed - No data to display  EKG None  Radiology DG Ribs Unilateral W/Chest Left  Result Date: 01/01/2023 CLINICAL DATA:  Pain after fall.  History of small-cell lung cancer. EXAM: LEFT RIBS AND CHEST - 5 VIEW COMPARISON:  Chest x-ray 01/25/2022.  CT scan 10/21/2022. FINDINGS: Right IJ chest port in place with tip along the right atrium. No consolidation, pneumothorax or effusion. No edema. Normal cardiopericardial silhouette. Calcified aorta. The right inferior costophrenic angle is clipped off the edge of the film. Surgical clips in the upper abdomen. Degenerative changes noted along the spine with some curvature. No rib fracture clearly seen by x-ray. IMPRESSION: No rib fracture identified by x-ray. No pleural effusion or pneumothorax. Chest port. Electronically Signed   By: Karen Kays M.D.   On: 01/01/2023 16:04   DG Elbow Complete Left  Result Date: 01/01/2023 CLINICAL DATA:  Pain after fall EXAM: LEFT ELBOW - COMPLETE 4 VIEW COMPARISON:  None Available. FINDINGS: There is no evidence of fracture, dislocation, or joint effusion. There is no evidence of arthropathy or other focal bone abnormality. Soft tissues are unremarkable. IMPRESSION: No acute osseous abnormality. Electronically Signed   By: Karen Kays M.D.   On: 01/01/2023 16:01   DG Shoulder  Left  Result Date: 01/01/2023 CLINICAL DATA:  Pain after fall EXAM: LEFT SHOULDER - 3 VIEW COMPARISON:  None Available. FINDINGS: No fracture or dislocation. Preserved glenohumeral joint. Small osteophytes of the AC joint. At the edge of the imaging field is a central catheter along the thorax. Please see separate chest x-ray IMPRESSION: Degenerative changes of the [facility] joint. Electronically Signed   By: Karen Kays M.D.   On: 01/01/2023 16:00   DG Hip Unilat With Pelvis 2-3 Views Left  Result Date: 01/01/2023 CLINICAL DATA:  Status post fall with left hip pain EXAM: DG HIP (WITH OR WITHOUT PELVIS) 3V LEFT COMPARISON:  None Available. FINDINGS: Right hip arthroplasty. Hardware appears intact. There is no evidence of hip fracture or dislocation. Degenerative changes of the partially imaged lumbar spine, bilateral sacroiliac joints, and left hip. Metallic clip projects over the midline pelvis, likely postsurgical. IMPRESSION: 1. No acute displaced left hip fracture or dislocation. 2. Right hip arthroplasty without evidence of hardware complication. Electronically Signed   By: Agustin Cree M.D.   On: 01/01/2023 15:59    Procedures Procedures    Medications Ordered in ED Medications  HYDROcodone-acetaminophen (NORCO/VICODIN) 5-325 MG per tablet 1 tablet (1 tablet Oral Given 01/01/23 1459)    ED Course/ Medical Decision Making/ A&P                                 Medical Decision Making Elderly female presents after mechanical fall occurred in the context of near syncope, likely vagal.  Patient is awake, alert, neurologically intact distally.  She does have pain in multiple areas under suspicion for soft tissue versus bony injury.  Absence of other complaints or is reassuring.  X-rays ordered, analgesia ordered.  Amount and/or Complexity of Data Reviewed Independent Historian: spouse External Data Reviewed: notes. Radiology: ordered and independent interpretation performed. Decision-making details  documented in ED Course.  Risk Prescription drug management. Decision regarding hospitalization.   4:26 PM Patient awake, alert, able to bear weight.  I demonstrated the x-ray images  to the patient and her husband at bedside.  Reviewed all images, and radiology interpretation.  She remains hemodynamically unremarkable, distally neurovascularly unremarkable, and is appropriate for discharge.  She does have a hematoma on the left hip, and we discussed home care for this. Final Clinical Impression(s) / ED Diagnoses Final diagnoses:  Fall, initial encounter    Rx / DC Orders ED Discharge Orders     None         Gerhard Munch, MD 01/01/23 1626

## 2023-01-01 NOTE — ED Notes (Signed)
As I was getting to print d/c paperwork the pt and spouse came out of the room and said she did not want the paperwork and that they could see everything on my chart and she was leaving.  I tried to print them for her and she was addiment that she did not want them.

## 2023-01-02 ENCOUNTER — Telehealth: Payer: Self-pay | Admitting: Physician Assistant

## 2023-01-02 ENCOUNTER — Encounter: Payer: Self-pay | Admitting: Hematology and Oncology

## 2023-01-02 ENCOUNTER — Other Ambulatory Visit: Payer: Self-pay | Admitting: *Deleted

## 2023-01-02 MED ORDER — MECLIZINE HCL 12.5 MG PO TABS: 12.5000 mg | ORAL_TABLET | Freq: Three times a day (TID) | ORAL | 1 refills | Status: DC | PRN

## 2023-01-02 NOTE — Telephone Encounter (Signed)
Last OV: 09/05/22  Next OV: 01/06/23  Last Filled: 12/02/22  Quantity: 90

## 2023-01-02 NOTE — Telephone Encounter (Signed)
Prescription Request  01/02/2023  LOV: 04/15/2022  What is the name of the medication or equipment? ALPRAZolam [name]) 1 MG tablet   Have you contacted your pharmacy to request a refill? Yes   Which pharmacy would you like this sent to?  [facility] [place], [place] - [address] [place] [address] Phone: [phone] Fax: [phone]    Patient notified that their request is being sent to the clinical staff for review and that they should receive a response within 2 business days.   Please advise at Mobile [phone] (mobile)

## 2023-01-03 ENCOUNTER — Other Ambulatory Visit: Payer: Self-pay | Admitting: Physician Assistant

## 2023-01-03 NOTE — Telephone Encounter (Signed)
Patient called for an update

## 2023-01-06 ENCOUNTER — Ambulatory Visit (INDEPENDENT_AMBULATORY_CARE_PROVIDER_SITE_OTHER): Payer: 59 | Admitting: Physician Assistant

## 2023-01-06 ENCOUNTER — Ambulatory Visit (HOSPITAL_COMMUNITY)
Admission: RE | Admit: 2023-01-06 | Discharge: 2023-01-06 | Disposition: A | Discharge status: Home / Self Care | Payer: 59 | Source: Ambulatory Visit | Attending: Hematology and Oncology | Admitting: Hematology and Oncology

## 2023-01-06 ENCOUNTER — Encounter (HOSPITAL_COMMUNITY): Payer: Self-pay

## 2023-01-06 VITALS — BP 118/76 | HR 100 | Temp 97.7°F | Resp 16 | Ht 62.0 in | Wt 174.0 lb

## 2023-01-06 DIAGNOSIS — R42 Dizziness and giddiness: Secondary | ICD-10-CM

## 2023-01-06 DIAGNOSIS — K5730 Diverticulosis of large intestine without perforation or abscess without bleeding: Secondary | ICD-10-CM | POA: Diagnosis not present

## 2023-01-06 DIAGNOSIS — F419 Anxiety disorder, unspecified: Secondary | ICD-10-CM | POA: Diagnosis not present

## 2023-01-06 DIAGNOSIS — C3431 Malignant neoplasm of lower lobe, right bronchus or lung: Secondary | ICD-10-CM | POA: Insufficient documentation

## 2023-01-06 DIAGNOSIS — Z9181 History of falling: Secondary | ICD-10-CM | POA: Diagnosis not present

## 2023-01-06 DIAGNOSIS — F32A Depression, unspecified: Secondary | ICD-10-CM

## 2023-01-06 DIAGNOSIS — C3490 Malignant neoplasm of unspecified part of unspecified bronchus or lung: Secondary | ICD-10-CM | POA: Diagnosis not present

## 2023-01-06 DIAGNOSIS — R630 Anorexia: Secondary | ICD-10-CM

## 2023-01-06 MED ORDER — IOHEXOL 300 MG/ML  SOLN
100.0000 mL | Freq: Once | INTRAMUSCULAR | Status: AC | PRN
  Administered 2023-01-06: 100 mL via INTRAVENOUS

## 2023-01-06 MED ORDER — ALPRAZOLAM 1 MG PO TABS: 1.0000 mg | ORAL_TABLET | Freq: Three times a day (TID) | ORAL | 2 refills | Status: DC | PRN

## 2023-01-06 NOTE — Progress Notes (Unsigned)
Patient ID: [name], female    DOB: 10/22/1955, 67 y.o.   MRN: [id number]   Assessment & Plan:  There are no diagnoses linked to this encounter.      No follow-ups on file.    Subjective:    Chief Complaint  Patient presents with   Anxiety    Here for follow up    HPI Patient is in today for ***  Past Medical History:  Diagnosis Date   Allergic rhinitis    Anemia    Anxiety    Chicken pox    Chronic back pain    COPD (chronic obstructive pulmonary disease) (HCC)    Depression    DM (diabetes mellitus) (HCC)    Essential hypertension    GERD (gastroesophageal reflux disease)    Headache    migraines   History of gastritis    EGD 2015   History of home oxygen therapy    2 liters at hs last 6 months   Hyperlipidemia    Hypothyroidism    Migraines    Osteoarthritis    oa   Scoliosis    Small cell lung cancer (HCC)    Stage 4    Past Surgical History:  Procedure Laterality Date   APPENDECTOMY     1985   BIOPSY  07/24/2017   Procedure: BIOPSY;  Surgeon: Rachael Fee, MD;  Location: [facility];  Service: Endoscopy;;   CARDIAC CATHETERIZATION N/A 10/31/2015   Procedure: Left Heart Cath and Coronary Angiography;  Surgeon: Marykay Lex, MD;  Location: [facility] INVASIVE CV LAB;  Service: Cardiovascular;  Laterality: N/A;   CARPAL TUNNEL RELEASE Left    CARPAL TUNNEL RELEASE Right    CHOLECYSTECTOMY  late 1980's   COLONOSCOPY WITH PROPOFOL N/A 07/24/2017   Procedure: COLONOSCOPY WITH PROPOFOL;  Surgeon: Rachael Fee, MD;  Location: [facility];  Service: Endoscopy;  Laterality: N/A;   ESOPHAGOGASTRODUODENOSCOPY N/A 07/24/2017   Procedure: ESOPHAGOGASTRODUODENOSCOPY (EGD);  Surgeon: Rachael Fee, MD;  Location: [facility];  Service: Endoscopy;  Laterality: N/A;   ESOPHAGOGASTRODUODENOSCOPY (EGD) WITH PROPOFOL N/A 10/19/2020   Procedure: ESOPHAGOGASTRODUODENOSCOPY (EGD) WITH PROPOFOL;  Surgeon: Rachael Fee, MD;  Location: [facility];   Service: Endoscopy;  Laterality: N/A;   FINE NEEDLE ASPIRATION N/A 10/19/2020   Procedure: FINE NEEDLE ASPIRATION (FNA) LINEAR;  Surgeon: Rachael Fee, MD;  Location: [facility];  Service: Endoscopy;  Laterality: N/A;   GALLBLADDER SURGERY  1991   HIP CLOSED REDUCTION Right 01/08/2016   Procedure: CLOSED MANIPULATION HIP;  Surgeon: Jene Every, MD;  Location: [facility];  Service: Orthopedics;  Laterality: Right;   HIP CLOSED REDUCTION Right 01/19/2016   Procedure: ATTEMPTED CLOSED REDUCTION RIGHT HIP;  Surgeon: Toni Arthurs, MD;  Location: [facility];  Service: Orthopedics;  Laterality: Right;   HIP CLOSED REDUCTION Right 01/20/2016   Procedure: CLOSED REDUCTION RIGHT TOTAL HIP;  Surgeon: Durene Romans, MD;  Location: [facility];  Service: Orthopedics;  Laterality: Right;   HIP CLOSED REDUCTION Right 02/17/2016   Procedure: CLOSED REDUCTION RIGHT TOTAL HIP;  Surgeon: Samson Frederic, MD;  Location: [facility];  Service: Orthopedics;  Laterality: Right;   HIP CLOSED REDUCTION Right 02/28/2016   Procedure: CLOSED REDUCTION HIP;  Surgeon: Yolonda Kida, MD;  Location: [facility];  Service: Orthopedics;  Laterality: Right;   IR IMAGING GUIDED PORT INSERTION  11/01/2020   POLYPECTOMY  07/24/2017   Procedure: POLYPECTOMY;  Surgeon: Rachael Fee, MD;  Location: [facility];  Service: Endoscopy;;   TONSILLECTOMY     TOTAL ABDOMINAL HYSTERECTOMY     1985, with 1 ovary removed and 2 nd ovary removed 2003   TOTAL HIP ARTHROPLASTY Right    Original surgery 2006 with revision 2010   TOTAL HIP REVISION Right 01/01/2016   Procedure: TOTAL HIP REVISION;  Surgeon: Durene Romans, MD;  Location: [facility];  Service: Orthopedics;  Laterality: Right;   TOTAL HIP REVISION Right 03/02/2016   Procedure: TOTAL HIP REVISION;  Surgeon: Durene Romans, MD;  Location: [facility];  Service: Orthopedics;  Laterality: Right;   TOTAL HIP REVISION Right 09/02/2016   Procedure: Right hip constrained liner- posterior;  Surgeon: Durene Romans, MD;   Location: [facility];  Service: Orthopedics;  Laterality: Right;   ULNAR NERVE TRANSPOSITION Right    UPPER ESOPHAGEAL ENDOSCOPIC ULTRASOUND (EUS) N/A 10/19/2020   Procedure: UPPER ESOPHAGEAL ENDOSCOPIC ULTRASOUND (EUS);  Surgeon: Rachael Fee, MD;  Location: [facility];  Service: Endoscopy;  Laterality: N/A;  periduodenal lesion    Family History  Problem Relation Age of Onset   COPD Mother    Heart disease Mother    Lung disease Father        Asbestosis   Heart attack Father    Heart disease Father    Cerebral aneurysm Brother    Aneurysm Brother        Brain   Drug abuse Daughter    Epilepsy Son    Alcohol abuse Son    Drug abuse Son    Arthritis Maternal Grandmother    Heart disease Maternal Grandmother    Asthma Maternal Grandfather    Cancer Maternal Grandfather    Arthritis Paternal Grandmother    Heart disease Paternal Grandmother    Stroke Paternal Grandmother    Early death Paternal Grandfather    Heart disease Paternal Grandfather     Social History   Tobacco Use   Smoking status: Every Day    Current packs/day: 2.00    Average packs/day: 2.0 packs/day for 46.0 years (92.0 ttl pk-yrs)    Types: Cigarettes   Smokeless tobacco: Never   Tobacco comments:    2 packs of cigarettes smoked daily 12/13/21- declines smoking cessation  Vaping Use   Vaping status: Never Used  Substance Use Topics   Alcohol use: No   Drug use: No     Allergies  Allergen Reactions   Metformin And Related Diarrhea   Nsaids Diarrhea   Wellbutrin [Bupropion] Other (See Comments)    Makes her too sleepy    Aleve [Naproxen Sodium] Other (See Comments)    Headache    Codeine Nausea Only and Other (See Comments)    GI upset   Penicillins Nausea Only and Other (See Comments)    GI upset    Sulfonamide Derivatives Hives    Review of Systems NEGATIVE UNLESS OTHERWISE INDICATED IN HPI      Objective:     BP 118/76 (BP Location: Right Arm, Patient Position: Sitting, Cuff  Size: Small)   Pulse 100   Temp 97.7 F (36.5 C) (Temporal)   Resp 16   Ht 5\' 2"  (1.575 m)   Wt 174 lb (78.9 kg)   SpO2 97%   BMI 31.83 kg/m   Wt Readings from Last 3 Encounters:  01/06/23 174 lb (78.9 kg)  01/01/23 173 lb 15.1 oz (78.9 kg)  01/01/23 174 lb (78.9 kg)    BP Readings from Last 3 Encounters:  01/06/23 118/76  01/01/23 134/88  01/01/23 114/76     Physical Exam        Time Spent: *** minutes of total time was spent on the date of the encounter performing the following actions: chart review prior to seeing the patient, obtaining history, performing a medically necessary exam, counseling on the treatment plan, placing orders, and documenting in our EHR.       Loribeth Katich M Braniya Farrugia, PA-C

## 2023-01-06 NOTE — Telephone Encounter (Signed)
Called patient, per husband she will be here for her appointment today

## 2023-01-07 ENCOUNTER — Telehealth: Payer: Self-pay | Admitting: Physician Assistant

## 2023-01-07 NOTE — Telephone Encounter (Signed)
Patient wants to Alyssa to know that she was successful at scheduling her brother a New Patient appointment with her.

## 2023-01-07 NOTE — Assessment & Plan Note (Signed)
Cymbalta 120 mg daily. Xanax 1 mg TID daily. Stable, doing well at this time.  PDMP reviewed today, no red flags, filling appropriately.

## 2023-01-07 NOTE — Telephone Encounter (Signed)
[name] has been advised

## 2023-01-08 DIAGNOSIS — H2513 Age-related nuclear cataract, bilateral: Secondary | ICD-10-CM | POA: Diagnosis not present

## 2023-01-08 DIAGNOSIS — H04123 Dry eye syndrome of bilateral lacrimal glands: Secondary | ICD-10-CM | POA: Diagnosis not present

## 2023-01-08 DIAGNOSIS — H353221 Exudative age-related macular degeneration, left eye, with active choroidal neovascularization: Secondary | ICD-10-CM | POA: Diagnosis not present

## 2023-01-08 DIAGNOSIS — H353112 Nonexudative age-related macular degeneration, right eye, intermediate dry stage: Secondary | ICD-10-CM | POA: Diagnosis not present

## 2023-01-13 ENCOUNTER — Ambulatory Visit: Payer: 59 | Admitting: Dermatology

## 2023-01-15 ENCOUNTER — Inpatient Hospital Stay (HOSPITAL_BASED_OUTPATIENT_CLINIC_OR_DEPARTMENT_OTHER): Payer: 59 | Admitting: Hematology and Oncology

## 2023-01-15 ENCOUNTER — Inpatient Hospital Stay: Payer: 59

## 2023-01-15 ENCOUNTER — Inpatient Hospital Stay: Payer: 59 | Admitting: Dietician

## 2023-01-15 ENCOUNTER — Inpatient Hospital Stay: Payer: 59 | Attending: Hematology and Oncology

## 2023-01-15 VITALS — BP 133/78 | HR 92 | Temp 97.3°F | Resp 17 | Ht 62.0 in

## 2023-01-15 VITALS — Ht 62.0 in | Wt 178.1 lb

## 2023-01-15 DIAGNOSIS — C3431 Malignant neoplasm of lower lobe, right bronchus or lung: Secondary | ICD-10-CM | POA: Insufficient documentation

## 2023-01-15 DIAGNOSIS — R42 Dizziness and giddiness: Secondary | ICD-10-CM | POA: Diagnosis not present

## 2023-01-15 DIAGNOSIS — G8929 Other chronic pain: Secondary | ICD-10-CM | POA: Insufficient documentation

## 2023-01-15 DIAGNOSIS — Z8719 Personal history of other diseases of the digestive system: Secondary | ICD-10-CM | POA: Insufficient documentation

## 2023-01-15 DIAGNOSIS — Z811 Family history of alcohol abuse and dependence: Secondary | ICD-10-CM | POA: Insufficient documentation

## 2023-01-15 DIAGNOSIS — Z5986 Financial insecurity: Secondary | ICD-10-CM | POA: Diagnosis not present

## 2023-01-15 DIAGNOSIS — I10 Essential (primary) hypertension: Secondary | ICD-10-CM | POA: Insufficient documentation

## 2023-01-15 DIAGNOSIS — I700 Atherosclerosis of aorta: Secondary | ICD-10-CM | POA: Diagnosis not present

## 2023-01-15 DIAGNOSIS — M51369 Other intervertebral disc degeneration, lumbar region without mention of lumbar back pain or lower extremity pain: Secondary | ICD-10-CM | POA: Diagnosis not present

## 2023-01-15 DIAGNOSIS — Z5112 Encounter for antineoplastic immunotherapy: Secondary | ICD-10-CM | POA: Diagnosis not present

## 2023-01-15 DIAGNOSIS — Z823 Family history of stroke: Secondary | ICD-10-CM | POA: Insufficient documentation

## 2023-01-15 DIAGNOSIS — Z814 Family history of other substance abuse and dependence: Secondary | ICD-10-CM | POA: Insufficient documentation

## 2023-01-15 DIAGNOSIS — Z79899 Other long term (current) drug therapy: Secondary | ICD-10-CM | POA: Diagnosis not present

## 2023-01-15 DIAGNOSIS — M48061 Spinal stenosis, lumbar region without neurogenic claudication: Secondary | ICD-10-CM | POA: Insufficient documentation

## 2023-01-15 DIAGNOSIS — F17210 Nicotine dependence, cigarettes, uncomplicated: Secondary | ICD-10-CM | POA: Diagnosis not present

## 2023-01-15 DIAGNOSIS — K5730 Diverticulosis of large intestine without perforation or abscess without bleeding: Secondary | ICD-10-CM | POA: Insufficient documentation

## 2023-01-15 DIAGNOSIS — F32A Depression, unspecified: Secondary | ICD-10-CM | POA: Insufficient documentation

## 2023-01-15 DIAGNOSIS — Z882 Allergy status to sulfonamides status: Secondary | ICD-10-CM | POA: Insufficient documentation

## 2023-01-15 DIAGNOSIS — J439 Emphysema, unspecified: Secondary | ICD-10-CM | POA: Diagnosis not present

## 2023-01-15 DIAGNOSIS — Z888 Allergy status to other drugs, medicaments and biological substances status: Secondary | ICD-10-CM | POA: Insufficient documentation

## 2023-01-15 DIAGNOSIS — Z95828 Presence of other vascular implants and grafts: Secondary | ICD-10-CM | POA: Diagnosis not present

## 2023-01-15 DIAGNOSIS — Z820 Family history of epilepsy and other diseases of the nervous system: Secondary | ICD-10-CM | POA: Insufficient documentation

## 2023-01-15 DIAGNOSIS — Z809 Family history of malignant neoplasm, unspecified: Secondary | ICD-10-CM | POA: Insufficient documentation

## 2023-01-15 DIAGNOSIS — M419 Scoliosis, unspecified: Secondary | ICD-10-CM | POA: Diagnosis not present

## 2023-01-15 DIAGNOSIS — Z8249 Family history of ischemic heart disease and other diseases of the circulatory system: Secondary | ICD-10-CM | POA: Insufficient documentation

## 2023-01-15 DIAGNOSIS — Z885 Allergy status to narcotic agent status: Secondary | ICD-10-CM | POA: Insufficient documentation

## 2023-01-15 DIAGNOSIS — M47816 Spondylosis without myelopathy or radiculopathy, lumbar region: Secondary | ICD-10-CM | POA: Insufficient documentation

## 2023-01-15 DIAGNOSIS — Z8261 Family history of arthritis: Secondary | ICD-10-CM | POA: Insufficient documentation

## 2023-01-15 DIAGNOSIS — Z79620 Long term (current) use of immunosuppressive biologic: Secondary | ICD-10-CM | POA: Diagnosis not present

## 2023-01-15 DIAGNOSIS — E785 Hyperlipidemia, unspecified: Secondary | ICD-10-CM | POA: Diagnosis not present

## 2023-01-15 DIAGNOSIS — Z9049 Acquired absence of other specified parts of digestive tract: Secondary | ICD-10-CM | POA: Insufficient documentation

## 2023-01-15 DIAGNOSIS — M533 Sacrococcygeal disorders, not elsewhere classified: Secondary | ICD-10-CM | POA: Diagnosis not present

## 2023-01-15 DIAGNOSIS — E039 Hypothyroidism, unspecified: Secondary | ICD-10-CM | POA: Diagnosis not present

## 2023-01-15 DIAGNOSIS — Z90710 Acquired absence of both cervix and uterus: Secondary | ICD-10-CM | POA: Insufficient documentation

## 2023-01-15 DIAGNOSIS — E876 Hypokalemia: Secondary | ICD-10-CM | POA: Diagnosis not present

## 2023-01-15 DIAGNOSIS — F419 Anxiety disorder, unspecified: Secondary | ICD-10-CM | POA: Diagnosis not present

## 2023-01-15 DIAGNOSIS — K219 Gastro-esophageal reflux disease without esophagitis: Secondary | ICD-10-CM | POA: Diagnosis not present

## 2023-01-15 DIAGNOSIS — Z825 Family history of asthma and other chronic lower respiratory diseases: Secondary | ICD-10-CM | POA: Insufficient documentation

## 2023-01-15 DIAGNOSIS — Z886 Allergy status to analgesic agent status: Secondary | ICD-10-CM | POA: Insufficient documentation

## 2023-01-15 DIAGNOSIS — K7460 Unspecified cirrhosis of liver: Secondary | ICD-10-CM | POA: Diagnosis not present

## 2023-01-15 DIAGNOSIS — Z90721 Acquired absence of ovaries, unilateral: Secondary | ICD-10-CM | POA: Insufficient documentation

## 2023-01-15 DIAGNOSIS — Z880 Allergy status to penicillin: Secondary | ICD-10-CM | POA: Insufficient documentation

## 2023-01-15 LAB — CMP (CANCER CENTER ONLY)
ALT: 9 U/L (ref 0–44)
AST: 12 U/L — ABNORMAL LOW (ref 15–41)
Albumin: 3.4 g/dL — ABNORMAL LOW (ref 3.5–5.0)
Alkaline Phosphatase: 84 U/L (ref 38–126)
Anion gap: 8 (ref 5–15)
BUN: 12 mg/dL (ref 8–23)
CO2: 27 mmol/L (ref 22–32)
Calcium: 8.4 mg/dL — ABNORMAL LOW (ref 8.9–10.3)
Chloride: 95 mmol/L — ABNORMAL LOW (ref 98–111)
Creatinine: 1.07 mg/dL — ABNORMAL HIGH (ref 0.44–1.00)
GFR, Estimated: 57 mL/min — ABNORMAL LOW (ref >60)
Glucose, Bld: 110 mg/dL — ABNORMAL HIGH (ref 70–99)
Potassium: 3 mmol/L — ABNORMAL LOW (ref 3.5–5.1)
Sodium: 130 mmol/L — ABNORMAL LOW (ref 135–145)
Total Bilirubin: 0.5 mg/dL (ref <1.2)
Total Protein: 5.8 g/dL — ABNORMAL LOW (ref 6.5–8.1)

## 2023-01-15 LAB — CBC WITH DIFFERENTIAL (CANCER CENTER ONLY)
Abs Immature Granulocytes: 0.07 10*3/uL (ref 0.00–0.07)
Basophils Absolute: 0.1 10*3/uL (ref 0.0–0.1)
Basophils Relative: 1 %
Eosinophils Absolute: 0.5 10*3/uL (ref 0.0–0.5)
Eosinophils Relative: 5 %
HCT: 37.2 % (ref 36.0–46.0)
Hemoglobin: 13.1 g/dL (ref 12.0–15.0)
Immature Granulocytes: 1 %
Lymphocytes Relative: 18 %
Lymphs Abs: 1.8 10*3/uL (ref 0.7–4.0)
MCH: 32.2 pg (ref 26.0–34.0)
MCHC: 35.2 g/dL (ref 30.0–36.0)
MCV: 91.4 fL (ref 80.0–100.0)
Monocytes Absolute: 0.7 10*3/uL (ref 0.1–1.0)
Monocytes Relative: 7 %
Neutro Abs: 6.9 10*3/uL (ref 1.7–7.7)
Neutrophils Relative %: 68 %
Platelet Count: 211 10*3/uL (ref 150–400)
RBC: 4.07 MIL/uL (ref 3.87–5.11)
RDW: 14.8 % (ref 11.5–15.5)
WBC Count: 10 10*3/uL (ref 4.0–10.5)
nRBC: 0 % (ref 0.0–0.2)

## 2023-01-15 LAB — TSH: TSH: 3.978 u[IU]/mL (ref 0.350–4.500)

## 2023-01-15 MED ORDER — SODIUM CHLORIDE 0.9 % IV SOLN: Freq: Once | INTRAVENOUS | Status: AC

## 2023-01-15 MED ORDER — SODIUM CHLORIDE 0.9% FLUSH
10.0000 mL | Freq: Once | INTRAVENOUS | Status: AC
  Administered 2023-01-15: 10 mL

## 2023-01-15 MED ORDER — ATEZOLIZUMAB CHEMO INJECTION 1200 MG/20ML
1200.0000 mg | Freq: Once | INTRAVENOUS | Status: AC
  Administered 2023-01-15: 1200 mg via INTRAVENOUS
  Filled 2023-01-15: qty 20

## 2023-01-15 MED ORDER — ONDANSETRON HCL 4 MG/2ML IJ SOLN
8.0000 mg | Freq: Once | INTRAMUSCULAR | Status: AC
  Administered 2023-01-15: 8 mg via INTRAVENOUS
  Filled 2023-01-15: qty 4

## 2023-01-15 NOTE — Progress Notes (Signed)
Nutrition Follow-up:  Patient with extensive stage small cell lung cancer. She is receiving maintenance therapy with atezolizumab q21d.   Noted recent 12/9 imaging showing no recurrence  Met with patient in infusion. She reports appetite has been some better recently. Patient says her stomach has been growling and if she eats at that time, then she eats well. Patient had 2 falls within the last week. She did not hit her head, however reports pain and swelling to left knee ongoing since Sunday. Patient is agreeable to let RD and infusion RN look at this. Left knee notably larger than right and warm to the touch. No bruising or abrasion seen. Infusion RN Almyra Free) has sent message to Dr. Leonides Schanz.    Medications: reviewed   Labs: Na 130, K 3.0, albumin 3.4  Anthropometrics: Wt 178 lb 1 oz today - increased (suspect some wt r/t fluid around knee)  11/26 - 173 lb 12.8 oz 10/16 - 175 lb 9.6 oz   NUTRITION DIAGNOSIS: Inadequate oral intake - improving    INTERVENTION:  Continue paying attention to hunger cues  Encouraged high calorie high protein foods for wt maintenance Strongly encouraged further evaluation of knee    MONITORING, EVALUATION, GOAL: wt trends, intake   NEXT VISIT: Wednesday January 29 during infusion

## 2023-01-15 NOTE — Patient Instructions (Signed)
[facility] - A DEPT OF [facility]  Discharge Instructions: Thank you for choosing [facility] to provide your oncology and hematology care.   If you have a lab appointment with the Cancer Center, please go directly to the Cancer Center and check in at the registration area.   Wear comfortable clothing and clothing appropriate for easy access to any Portacath or PICC line.   We strive to give you quality time with your provider. You may need to reschedule your appointment if you arrive late (15 or more minutes).  Arriving late affects you and other patients whose appointments are after yours.  Also, if you miss three or more appointments without notifying the office, you may be dismissed from the clinic at the provider's discretion.      For prescription refill requests, have your pharmacy contact our office and allow 72 hours for refills to be completed.    Today you received the following chemotherapy and/or immunotherapy agents tecentriq      To help prevent nausea and vomiting after your treatment, we encourage you to take your nausea medication as directed.  BELOW ARE SYMPTOMS THAT SHOULD BE REPORTED IMMEDIATELY: *FEVER GREATER THAN 100.4 F (38 C) OR HIGHER *CHILLS OR SWEATING *NAUSEA AND VOMITING THAT IS NOT CONTROLLED WITH YOUR NAUSEA MEDICATION *UNUSUAL SHORTNESS OF BREATH *UNUSUAL BRUISING OR BLEEDING *URINARY PROBLEMS (pain or burning when urinating, or frequent urination) *BOWEL PROBLEMS (unusual diarrhea, constipation, pain near the anus) TENDERNESS IN MOUTH AND THROAT WITH OR WITHOUT PRESENCE OF ULCERS (sore throat, sores in mouth, or a toothache) UNUSUAL RASH, SWELLING OR PAIN  UNUSUAL VAGINAL DISCHARGE OR ITCHING   Items with * indicate a potential emergency and should be followed up as soon as possible or go to the Emergency Department if any problems should occur.  Please show the CHEMOTHERAPY ALERT CARD or IMMUNOTHERAPY  ALERT CARD at check-in to the Emergency Department and triage nurse.  Should you have questions after your visit or need to cancel or reschedule your appointment, please contact [facility] - A DEPT OF [facility]  Dept: [phone]  and follow the prompts.  Office hours are 8:00 a.m. to 4:30 p.m. Monday - Friday. Please note that voicemails left after 4:00 p.m. may not be returned until the following business day.  We are closed weekends and major holidays. You have access to a nurse at all times for urgent questions. Please call the main number to the clinic Dept: [phone] and follow the prompts.   For any non-urgent questions, you may also contact your provider using MyChart. We now offer e-Visits for anyone 21 and older to request care online for non-urgent symptoms. For details visit [email or web address].   Also download the MyChart app! Go to the app store, search "MyChart", open the app, select [facility], and log in with your MyChart username and password.

## 2023-01-15 NOTE — Progress Notes (Signed)
[facility] [phone]   [phone]  PROGRESS NOTE  Patient Care Team: Allwardt, Crist Infante, PA-C as PCP - General (Physician Assistant) O'Neal, Ronnald Ramp, MD as PCP - Cardiology (Cardiology) Rachael Fee, MD as Attending Physician (Gastroenterology) Dr. Louie Bun, MD as Consulting Physician (Orthopedic Surgery) Ranee Gosselin, MD as Consulting Physician (Orthopedic Surgery) York Spaniel, MD (Inactive) as Consulting Physician (Neurology) Smitty Cords, OD as Consulting Physician (Optometry) Karel Jarvis Lesle Chris, MD as Consulting Physician (Neurology) Erroll Luna, [facility] (Inactive) (Pharmacist)  Hematological/Oncological History # Small Cell Lung Cancer, Extensive Stage 07/05/2020: CT abdomen for lower abdominal pain. New pulmonary nodular density noted 07/06/2020: CT chest showed 2.2 cm macrolobulated right lower lobe pulmonary nodule (favored) versus pathologically enlarged infrahilar lymph node 10/13/2020: PET CT scan performed, findings show 2 cm right lower lobe lung mass is hypermetabolic and consistent with primary lung neoplasm. Additionally found hypermetabolic 17 mm soft tissue lesion between the descending duodenum and the pancreatic head  10/19/2020: EGD to biopsy hypermetabolic lymph node. Biopsy results show small cell lung cancer 10/26/2020: establish care with Dr. Leonides Schanz  11/13/2020: Cycle 1 Day 1 of Carbo/Etop/Atezolizumab 12/04/2020: Cycle 2 Day 1 of Carbo/Etop/Atezolizumab 11/22-11/25/2022: admitted for E. Coli bacteremia/sepsis. Start of Cycle 3 delayed. 01/02/2021: Cycle 3 Day 1 of Carbo/Etop/Atezolizumab 01/23/2021: Cycle 4 Day 1 of Carbo/Etop/Atezolizumab 02/22/2021: Cycle 5 Day 1 of Atezolizumab Maintenance. Delayed start due to patient's COVID infection.  03/14/2021: Cycle 6 Day 1 of Atezolizumab Maintenance 04/05/2021: Cycle 7 Day 1 of Atezolizumab Maintenance 04/25/2021: Cycle 8 Day 1 of Atezolizumab  Maintenance 05/16/2021: Cycle 9 Day 1 of Atezolizumab Maintenance 06/06/2021: Cycle 10 Day 1 of Atezolizumab Maintenance 06/27/2021:  Cycle 11 Day 1 of Atezolizumab Maintenance 07/18/2021: Cycle 12 Day 1 of Atezolizumab Maintenance 08/08/2021: Cycle 13 Day 1 of Atezolizumab Maintenance 08/29/2021: Cycle 14 Day 1 of Atezolizumab Maintenance 09/19/2021: Cycle 15 Day 1 of Atezolizumab Maintenance 10/10/2021: Cycle 16 Day 1 of Atezolizumab Maintenance 10/31/2021: Cycle 17 Day 1 of Atezolizumab Maintenance 11/21/2021: Cycle 18 Day 1 of Atezolizumab Maintenance 12/12/2021: Cycle 19 Day 1 of Atezolizumab Maintenance 01/02/2022: Cycle 20 Day 1 of Atezolizumab Maintenance 01/23/2022: Cycle 21 Day 1 of Atezolizumab Maintenance 02/13/2022: treatment HELD due to diarrhea.  03/06/2022: Cycle 22 Day 1 of Atezolizumab Maintenance 03/27/2022: Cycle 23 Day 1 of Atezolizumab Maintenance 04/17/2022: Cycle 24 Day 1 of Atezolizumab Maintenance 05/08/2022:  Cycle 25 Day 1 of Atezolizumab Maintenance (Held due to patient preference for treatment holiday) 05/29/2022: Cycle 25 Day 1 of Atezolizumab Maintenance  06/19/2022: Cycle 26 Day 1 of Atezolizumab Maintenance  07/12/2022: Cycle 27 Day 1 of Atezolizumab Maintenance 07/31/2022: Cycle 28 Day 1 of Atezolizumab Maintenance HELD per patient request for fatigue.  08/22/2022: Cycle 28 Day 1 of Atezolizumab Maintenance 09/11/2022: Cycle 29 Day 1 of Atezolizumab Maintenance 10/03/2022: Cycle 30 Day 1 of Atezolizumab Maintenance 10/23/2022: Cycle 31 Day 1 of Atezolizumab Maintenance 11/13/2022: Cycle 32 Day 1 of Atezolizumab Maintenance 12/24/2022: Cycle 33 Day 1 of Atezolizumab Maintenance 01/15/2023: Cycle 34 Day 1 of Atezolizumab Maintenance  Interval History:  AFI CHESTANG 67 y.o. female with medical history significant for extensive stage small cell lung cancer who presents for a follow up visit. The patient's last visit was on 12/24/2022.  She presents today to start cycle 34 of  chemotherapy.   On exam today Mrs. Mansker reports she has had 2 falls in since our last visit.  She reports that she woke up at 3 AM and had  to use the bathroom bad.  He moved to hastily and tripped and fell on her left side.  She reports she hurt her knee and her hip but did not hit her head.  She is also having some soreness of her ribs.  Also about 2 weeks ago she had another fall when she tripped over a rug.  She also and reinjured her knee at that time.  She reports that other than the falls she has been quite well.  She is having some fatigue and occasional dizziness as well as some nausea and diarrhea but is tolerating the atezolizumab therapy well.  She reports that otherwise she remains at her baseline level of health and is willing and able to proceed with atezolizumab therapy at this time.  She denies fevers, chills, sweats, chest pain or cough. She has no other complaints.A full 10 point ROS is listed below.   MEDICAL HISTORY:  Past Medical History:  Diagnosis Date   Allergic rhinitis    Anemia    Anxiety    Chicken pox    Chronic back pain    COPD (chronic obstructive pulmonary disease) (HCC)    Depression    DM (diabetes mellitus) (HCC)    Essential hypertension    GERD (gastroesophageal reflux disease)    Headache    migraines   History of gastritis    EGD 2015   History of home oxygen therapy    2 liters at hs last 6 months   Hyperlipidemia    Hypothyroidism    Migraines    Osteoarthritis    oa   Scoliosis    Small cell lung cancer (HCC)    Stage 4    SURGICAL HISTORY: Past Surgical History:  Procedure Laterality Date   APPENDECTOMY     1985   BIOPSY  07/24/2017   Procedure: BIOPSY;  Surgeon: Rachael Fee, MD;  Location: [facility];  Service: Endoscopy;;   CARDIAC CATHETERIZATION N/A 10/31/2015   Procedure: Left Heart Cath and Coronary Angiography;  Surgeon: Marykay Lex, MD;  Location: [facility] INVASIVE CV LAB;  Service: Cardiovascular;  Laterality: N/A;    CARPAL TUNNEL RELEASE Left    CARPAL TUNNEL RELEASE Right    CHOLECYSTECTOMY  late 1980's   COLONOSCOPY WITH PROPOFOL N/A 07/24/2017   Procedure: COLONOSCOPY WITH PROPOFOL;  Surgeon: Rachael Fee, MD;  Location: [facility];  Service: Endoscopy;  Laterality: N/A;   ESOPHAGOGASTRODUODENOSCOPY N/A 07/24/2017   Procedure: ESOPHAGOGASTRODUODENOSCOPY (EGD);  Surgeon: Rachael Fee, MD;  Location: [facility];  Service: Endoscopy;  Laterality: N/A;   ESOPHAGOGASTRODUODENOSCOPY (EGD) WITH PROPOFOL N/A 10/19/2020   Procedure: ESOPHAGOGASTRODUODENOSCOPY (EGD) WITH PROPOFOL;  Surgeon: Rachael Fee, MD;  Location: [facility];  Service: Endoscopy;  Laterality: N/A;   FINE NEEDLE ASPIRATION N/A 10/19/2020   Procedure: FINE NEEDLE ASPIRATION (FNA) LINEAR;  Surgeon: Rachael Fee, MD;  Location: [facility];  Service: Endoscopy;  Laterality: N/A;   GALLBLADDER SURGERY  1991   HIP CLOSED REDUCTION Right 01/08/2016   Procedure: CLOSED MANIPULATION HIP;  Surgeon: Jene Every, MD;  Location: [facility];  Service: Orthopedics;  Laterality: Right;   HIP CLOSED REDUCTION Right 01/19/2016   Procedure: ATTEMPTED CLOSED REDUCTION RIGHT HIP;  Surgeon: Toni Arthurs, MD;  Location: [facility];  Service: Orthopedics;  Laterality: Right;   HIP CLOSED REDUCTION Right 01/20/2016   Procedure: CLOSED REDUCTION RIGHT TOTAL HIP;  Surgeon: Durene Romans, MD;  Location: [facility];  Service: Orthopedics;  Laterality: Right;   HIP  CLOSED REDUCTION Right 02/17/2016   Procedure: CLOSED REDUCTION RIGHT TOTAL HIP;  Surgeon: Samson Frederic, MD;  Location: [facility];  Service: Orthopedics;  Laterality: Right;   HIP CLOSED REDUCTION Right 02/28/2016   Procedure: CLOSED REDUCTION HIP;  Surgeon: Yolonda Kida, MD;  Location: [facility];  Service: Orthopedics;  Laterality: Right;   IR IMAGING GUIDED PORT INSERTION  11/01/2020   POLYPECTOMY  07/24/2017   Procedure: POLYPECTOMY;  Surgeon: Rachael Fee, MD;  Location: [facility];  Service:  Endoscopy;;   TONSILLECTOMY     TOTAL ABDOMINAL HYSTERECTOMY     1985, with 1 ovary removed and 2 nd ovary removed 2003   TOTAL HIP ARTHROPLASTY Right    Original surgery 2006 with revision 2010   TOTAL HIP REVISION Right 01/01/2016   Procedure: TOTAL HIP REVISION;  Surgeon: Durene Romans, MD;  Location: [facility];  Service: Orthopedics;  Laterality: Right;   TOTAL HIP REVISION Right 03/02/2016   Procedure: TOTAL HIP REVISION;  Surgeon: Durene Romans, MD;  Location: [facility];  Service: Orthopedics;  Laterality: Right;   TOTAL HIP REVISION Right 09/02/2016   Procedure: Right hip constrained liner- posterior;  Surgeon: Durene Romans, MD;  Location: [facility];  Service: Orthopedics;  Laterality: Right;   ULNAR NERVE TRANSPOSITION Right    UPPER ESOPHAGEAL ENDOSCOPIC ULTRASOUND (EUS) N/A 10/19/2020   Procedure: UPPER ESOPHAGEAL ENDOSCOPIC ULTRASOUND (EUS);  Surgeon: Rachael Fee, MD;  Location: [facility];  Service: Endoscopy;  Laterality: N/A;  periduodenal lesion    SOCIAL HISTORY: Social History   Socioeconomic History   Marital status: Married    Spouse name: Not on file   Number of children: 2   Years of education: Not on file   Highest education level: Some college, no degree  Occupational History   Occupation: disabled   Occupation: disabled  Tobacco Use   Smoking status: Every Day    Current packs/day: 2.00    Average packs/day: 2.0 packs/day for 46.0 years (92.0 ttl pk-yrs)    Types: Cigarettes   Smokeless tobacco: Never   Tobacco comments:    2 packs of cigarettes smoked daily 12/13/21- declines smoking cessation  Vaping Use   Vaping status: Never Used  Substance and Sexual Activity   Alcohol use: No   Drug use: No   Sexual activity: Not Currently    Partners: Male  Other Topics Concern   Not on file  Social History Narrative   Right handed    Caffeine~ 2 cups per day    Lives at home with husband (strained relationship)   Primary caretaker for disabled brother who had  aneurism   Daughter died 07/01/18   Social Drivers of Corporate investment banker Strain: High Risk (01/02/2023)   Overall Financial Resource Strain (CARDIA)    Difficulty of Paying Living Expenses: Very hard  Food Insecurity: No Food Insecurity (01/02/2023)   Hunger Vital Sign    Worried About Running Out of Food in the Last Year: Never true    Ran Out of Food in the Last Year: Never true  Transportation Needs: No Transportation Needs (01/02/2023)   PRAPARE - Administrator, Civil Service (Medical): No    Lack of Transportation (Non-Medical): No  Physical Activity: Inactive (01/02/2023)   Exercise Vital Sign    Days of Exercise per Week: 0 days    Minutes of Exercise per Session: 0 min  Stress: Stress Concern Present (01/02/2023)   [employer] of Occupational Health -  Occupational Stress Questionnaire    Feeling of Stress : Very much  Social Connections: Unknown (01/02/2023)   Social Connection and Isolation Panel [NHANES]    Frequency of Communication with Friends and Family: More than three times a week    Frequency of Social Gatherings with Friends and Family: More than three times a week    Attends Religious Services: 1 to 4 times per year    Active Member of [employer] or Organizations: No    Attends Banker Meetings: Never    Marital Status: Patient declined  Catering manager Violence: Not At Risk (05/16/2022)   Humiliation, Afraid, Rape, and Kick questionnaire    Fear of Current or Ex-Partner: No    Emotionally Abused: No    Physically Abused: No    Sexually Abused: No    FAMILY HISTORY: Family History  Problem Relation Age of Onset   COPD Mother    Heart disease Mother    Lung disease Father        Asbestosis   Heart attack Father    Heart disease Father    Cerebral aneurysm Brother    Aneurysm Brother        Brain   Drug abuse Daughter    Epilepsy Son    Alcohol abuse Son    Drug abuse Son    Arthritis Maternal Grandmother    Heart  disease Maternal Grandmother    Asthma Maternal Grandfather    Cancer Maternal Grandfather    Arthritis Paternal Grandmother    Heart disease Paternal Grandmother    Stroke Paternal Grandmother    Early death Paternal Grandfather    Heart disease Paternal Grandfather     ALLERGIES:  is allergic to metformin and related, nsaids, wellbutrin [bupropion], aleve [naproxen sodium], codeine, penicillins, and sulfonamide derivatives.  MEDICATIONS:  Current Outpatient Medications  Medication Sig Dispense Refill   albuterol (PROAIR HFA) 108 (90 Base) MCG/ACT inhaler 2 puffs every 4 hours as needed only  if your can't catch your breath 18 g 5   ALPRAZolam (XANAX) 1 MG tablet Take 1 tablet (1 mg total) by mouth 3 (three) times daily as needed for anxiety. for anxiety 90 tablet 2   ARIPiprazole (ABILIFY) 2 MG tablet Take 1 tablet (2 mg total) by mouth at bedtime. Try 2 mg nightly (can do in AM)  x 2 weeks, and if it hasn't helped at all, increase to 4 mg/2 tabs nightly- for resistant depression. 60 tablet 5   atorvastatin (LIPITOR) 20 MG tablet TAKE 1 TABLET BY MOUTH AT BEDTIME 90 tablet 1   benzonatate (TESSALON) 100 MG capsule Take 1 capsule (100 mg total) by mouth 3 (three) times daily. (Patient not taking: Reported on 01/01/2023) 90 capsule 1   calcipotriene (DOVONOX) 0.005 % cream Apply to affected area bid when on break from steroid cream 120 g 3   cyclobenzaprine (FLEXERIL) 10 MG tablet Take 10 mg by mouth 3 (three) times daily as needed.     dicyclomine (BENTYL) 10 MG capsule Take 1 capsule (10 mg total) by mouth every 6 (six) hours as needed for spasms. 90 capsule 3   diphenoxylate-atropine (LOMOTIL) 2.5-0.025 MG tablet Take 1 tablet by mouth 4 (four) times daily as needed for diarrhea or loose stools. 60 tablet 0   DULoxetine (CYMBALTA) 60 MG capsule TAKE TWO CAPSULES BY MOUTH EVERY DAY 180 capsule 1   FEROSUL 325 (65 Fe) MG tablet TAKE 1 TABLET BY MOUTH DAILY WITH BREAKFAST 30 tablet 3  fluconazole (DIFLUCAN) 150 MG tablet Take 1 tablet po once. May repeat dose in 3 days prn persistent symptoms. 3 tablet 1   fluticasone (FLONASE) 50 MCG/ACT nasal spray INSTILL 2 SPRAYS IN EACH NOSTRIL EVERY DAY 16 g 6   furosemide (LASIX) 20 MG tablet TAKE 1 TABLET BY MOUTH TWICE DAILY, IF SWELLING IMPROVES CAN HOLD MEDICATION 90 tablet 0   HYDROcodone-acetaminophen (NORCO) 10-325 MG tablet Take 1 tablet by mouth every 4 (four) hours as needed. To be filled 01/28/23 150 tablet 0   hydrOXYzine (ATARAX) 10 MG tablet TAKE 1 TABLET BY MOUTH THREE TIMES DAILY AS NEEDED FOR ITCHING 30 tablet 2   levothyroxine (SYNTHROID) 137 MCG tablet Take 1 tablet (137 mcg total) by mouth daily before breakfast. 90 tablet 3   loratadine (CLARITIN) 10 MG tablet Take 10 mg by mouth daily.     meclizine (ANTIVERT) 12.5 MG tablet Take 1 tablet (12.5 mg total) by mouth 3 (three) times daily as needed for dizziness. 30 tablet 1   meloxicam (MOBIC) 15 MG tablet TAKE 1 TABLET BY MOUTH EVERY DAY 30 tablet 2   nystatin (MYCOSTATIN) 100000 UNIT/ML suspension Swish and swallow with 5 ml up to QID prn for thrush 400 mL 0   OLANZapine (ZYPREXA) 10 MG tablet Take 1 tablet (10 mg total) by mouth at bedtime. 30 tablet 2   omeprazole (PRILOSEC) 40 MG capsule TAKE ONE CAPSULE BY MOUTH TWICE DAILY 180 capsule 3   ondansetron (ZOFRAN) 8 MG tablet Take 1 tablet (8 mg total) by mouth 2 (two) times daily. 60 tablet 1   Pancrelipase, Lip-Prot-Amyl, (CREON PO) Take by mouth.     permethrin (ELIMITE) 5 % cream      potassium chloride SA (KLOR-CON M) 20 MEQ tablet TAKE 1 TABLET BY MOUTH TWICE DAILY 60 tablet 2   prochlorperazine (COMPAZINE) 10 MG tablet Take 1 tablet (10 mg total) by mouth every 6 (six) hours as needed for nausea or vomiting. 30 tablet 0   Rimegepant Sulfate (NURTEC) 75 MG TBDP Take 1 tablet (75 mg total) by mouth daily as needed (for migraine-). Take within 15 minutes of initial symptoms 8 tablet 5   topiramate (TOPAMAX) 100 MG  tablet Take 1 tablet (100 mg total) by mouth at bedtime. Changed to 100 mg tabs- for ease 90 tablet 1   TRELEGY ELLIPTA 200-62.5-25 MCG/ACT AEPB INHALE 1 PUFFS into lungs DAILY AT 2pm 60 each 5   triamcinolone ointment (KENALOG) 0.5 % Apply 1 Application topically 3 (three) times daily. Apply thin-layer on affected areas, no more than 2 weeks consistently. 60 g 2   No current facility-administered medications for this visit.    REVIEW OF SYSTEMS:   Constitutional: ( - ) fevers, ( - )  chills , ( - ) night sweats Eyes: ( - ) blurriness of vision, ( - ) double vision, ( - ) watery eyes Ears, nose, mouth, throat, and face: ( - ) mucositis, ( - ) sore throat Respiratory: ( - ) cough, ( -) dyspnea, ( - ) wheezes Cardiovascular: ( - ) palpitation, ( - ) chest discomfort, ( - ) lower extremity swelling Gastrointestinal:  ( +) nausea, ( - ) heartburn, ( - ) change in bowel habits Skin: ( - ) abnormal skin rashes Lymphatics: ( - ) new lymphadenopathy, ( - ) easy bruising Neurological: (+ ) numbness, ( - ) tingling, ( - ) new weaknesses Behavioral/Psych: ( - ) mood change, ( - ) new changes  All other  systems were reviewed with the patient and are negative.  PHYSICAL EXAMINATION: ECOG PERFORMANCE STATUS: 1 - Symptomatic but completely ambulatory  Vitals:   01/15/23 1031  BP: 133/78  Pulse: 92  Resp: 17  Temp: (!) 97.3 F (36.3 C)  SpO2: 98%     There were no vitals filed for this visit.    GENERAL: Well-appearing middle-age Caucasian female, alert, no distress and comfortable SKIN: skin color, texture, turgor are normal, no rashes or significant lesions EYES: conjunctiva are pink and non-injected, sclera clear LUNGS:  normal breathing effort. Diffuse wheezing hear on ausculation.  HEART: regular rate & rhythm and no murmurs. Mild bilateral lower extremity edema Musculoskeletal: no cyanosis of digits and no clubbing  PSYCH: alert & oriented x 3, fluent speech NEURO: no focal  motor/sensory deficits  LABORATORY DATA:  I have reviewed the data as listed    Latest Ref Rng & Units 01/15/2023   10:13 AM 12/24/2022   10:31 AM 11/13/2022    1:15 PM  CBC  WBC 4.0 - 10.5 K/uL 10.0  9.1  9.1   Hemoglobin 12.0 - 15.0 g/dL 16.0  10.9  32.3   Hematocrit 36.0 - 46.0 % 37.2  42.2  44.6   Platelets 150 - 400 K/uL 211  196  171        Latest Ref Rng & Units 01/15/2023   10:13 AM 12/24/2022   10:31 AM 11/13/2022    1:15 PM  CMP  Glucose 70 - 99 mg/dL 557  98  322   BUN 8 - 23 mg/dL 12  12  12    Creatinine 0.44 - 1.00 mg/dL 0.25  4.27  0.62   Sodium 135 - 145 mmol/L 130  126  138   Potassium 3.5 - 5.1 mmol/L 3.0  2.7  3.3   Chloride 98 - 111 mmol/L 95  88  102   CO2 22 - 32 mmol/L 27  27  26    Calcium 8.9 - 10.3 mg/dL 8.4  8.7  8.8   Total Protein 6.5 - 8.1 g/dL 5.8  6.5  6.3   Total Bilirubin <1.2 mg/dL 0.5  0.5  0.3   Alkaline Phos 38 - 126 U/L 84  67  69   AST 15 - 41 U/L 12  15  12    ALT 0 - 44 U/L 9  7  8      No results found for: "MPROTEIN" Lab Results  Component Value Date   KPAFRELGTCHN 0.75 06/30/2014   LAMBDASER 3.78 (H) 06/30/2014   KAPLAMBRATIO 0.20 (L) 06/30/2014     RADIOGRAPHIC STUDIES: CT CHEST ABDOMEN PELVIS W CONTRAST Result Date: 01/08/2023 CLINICAL DATA:  Small-cell lung cancer. Assess treatment response. Immunotherapy. * Tracking Code: BO * EXAM: CT CHEST, ABDOMEN, AND PELVIS WITH CONTRAST TECHNIQUE: Multidetector CT imaging of the chest, abdomen and pelvis was performed following the standard protocol during bolus administration of intravenous contrast. RADIATION DOSE REDUCTION: This exam was performed according to the departmental dose-optimization program which includes automated exposure control, adjustment of the mA and/or kV according to patient size and/or use of iterative reconstruction technique. CONTRAST:  OMNIPAQUE IOHEXOL 300 MG/ML  SOLN COMPARISON:  CT 10/21/2022. older exams as well FINDINGS: CT CHEST FINDINGS  Cardiovascular: Right IJ chest port in place with tip along the SVC right atrial junction region. Heart is nonenlarged. No pericardial effusion. Coronary artery calcifications are seen. The thoracic aorta has a normal course and caliber with mild atherosclerotic calcified plaque. Mediastinum/Nodes: Small thyroid  gland. No specific abnormal lymph node enlargement identified in the axillary regions, hilum or mediastinum. Normal caliber thoracic esophagus. Lungs/Pleura: No consolidation, pneumothorax or effusion. Apical pleural thickening. Scattered subpleural blebs identified. Scattered emphysematous lung changes as well. No dominant mass. Musculoskeletal: Scattered degenerative changes. CT ABDOMEN PELVIS FINDINGS Hepatobiliary: No focal liver abnormality is seen. Status post cholecystectomy. Stable ectasia of the extrahepatic common duct. No intrahepatic biliary ductal dilatation. Pancreas: Moderate atrophy of the pancreas. Spleen: Normal in size without focal abnormality. Adrenals/Urinary Tract: Stable left adrenal nodule measuring 14 mm. This been characterized as an adenoma on previous examination. Preserved right adrenal gland. There is moderate atrophy of the left kidney. No collecting system dilatation or enhancing renal mass. Preserved contours of the urinary bladder. Stomach/Bowel: Moderate colonic stool identified with left-sided colonic diverticula. Large bowel on this non oral contrast examination has a normal caliber. Tortuous redundant course of the transverse colon. Appendix not well seen in the right lower quadrant but no pericecal stranding or fluid. The small bowel is nondilated. Stomach is nondilated. Vascular/Lymphatic: Diffuse vascular calcifications along the aorta and branch vessels. Normal caliber aorta and IVC. No specific abnormal lymph node enlargement identified in the abdomen and pelvis. Reproductive: Status post hysterectomy. No adnexal masses. Other: No free air or free fluid.  Musculoskeletal: Curvature of the spine with moderate degenerative changes in the lumbar region with multilevel disc bulging and stenosis. Degenerative changes of the pelvis. Streak artifact related to the patient's right hip arthroplasty obscuring surrounding tissues. There is new rounded structure in the subcutaneous fat lateral to the left hip measuring 5.7 x 3.2 cm with Hounsfield units of 58. One possibility would be a hematoma. Recommend correlation to any history of trauma or other process and short follow up to exclude underlying mass lesion. Please correlate with recent x-rays. IMPRESSION: Developing mass lesion, fluid collection or lymph node enlargement in the chest, abdomen or pelvis. Extensive colonic stool.  Few colonic diverticula. New rounded soft tissue density structure in the subcutaneous fat lateral to the left hip measuring 5.7 x 3.2 cm. This could be a focal hematoma. Please correlate with the history of injury. However recommend follow to confirm resolution and exclude underlying mass lesion or secondary pathology. Electronically Signed   By: Karen Kays M.D.   On: 01/08/2023 16:40   DG Ribs Unilateral W/Chest Left Result Date: 01/01/2023 CLINICAL DATA:  Pain after fall.  History of small-cell lung cancer. EXAM: LEFT RIBS AND CHEST - 5 VIEW COMPARISON:  Chest x-ray 01/25/2022.  CT scan 10/21/2022. FINDINGS: Right IJ chest port in place with tip along the right atrium. No consolidation, pneumothorax or effusion. No edema. Normal cardiopericardial silhouette. Calcified aorta. The right inferior costophrenic angle is clipped off the edge of the film. Surgical clips in the upper abdomen. Degenerative changes noted along the spine with some curvature. No rib fracture clearly seen by x-ray. IMPRESSION: No rib fracture identified by x-ray. No pleural effusion or pneumothorax. Chest port. Electronically Signed   By: Karen Kays M.D.   On: 01/01/2023 16:04   DG Elbow Complete Left Result Date:  01/01/2023 CLINICAL DATA:  Pain after fall EXAM: LEFT ELBOW - COMPLETE 4 VIEW COMPARISON:  None Available. FINDINGS: There is no evidence of fracture, dislocation, or joint effusion. There is no evidence of arthropathy or other focal bone abnormality. Soft tissues are unremarkable. IMPRESSION: No acute osseous abnormality. Electronically Signed   By: Karen Kays M.D.   On: 01/01/2023 16:01   DG Shoulder Left Result  Date: 01/01/2023 CLINICAL DATA:  Pain after fall EXAM: LEFT SHOULDER - 3 VIEW COMPARISON:  None Available. FINDINGS: No fracture or dislocation. Preserved glenohumeral joint. Small osteophytes of the AC joint. At the edge of the imaging field is a central catheter along the thorax. Please see separate chest x-ray IMPRESSION: Degenerative changes of the [facility] joint. Electronically Signed   By: Karen Kays M.D.   On: 01/01/2023 16:00   DG Hip Unilat With Pelvis 2-3 Views Left Result Date: 01/01/2023 CLINICAL DATA:  Status post fall with left hip pain EXAM: DG HIP (WITH OR WITHOUT PELVIS) 3V LEFT COMPARISON:  None Available. FINDINGS: Right hip arthroplasty. Hardware appears intact. There is no evidence of hip fracture or dislocation. Degenerative changes of the partially imaged lumbar spine, bilateral sacroiliac joints, and left hip. Metallic clip projects over the midline pelvis, likely postsurgical. IMPRESSION: 1. No acute displaced left hip fracture or dislocation. 2. Right hip arthroplasty without evidence of hardware complication. Electronically Signed   By: Agustin Cree M.D.   On: 01/01/2023 15:59    ASSESSMENT & PLAN TAMRAH PINKETT 67 y.o. female with medical history significant for extensive stage small cell lung cancer who presents for a follow up visit.   After review of the labs, review of the records, and discussion with the patient the patients findings are most consistent with extensive stage small cell lung cancer with metastasis from the right lower lobe to the lymph nodes of the  abdomen.  At this time we will pursue triple therapy with carboplatin, etoposide, and atezolizumab.  After 4 cycles we will convert to maintenance atezolizumab alone.  We previously discussed the risks and benefits of this therapy and the patient was in agreement to proceed with this treatment.  The treatment of choice consist of carboplatin, etoposide, and atezolizumab.  The regimen consists of carboplatin AUC of 5 IV on day 1, etoposide 100 mg per metered squared IV on day 1, 2, and 3 and atezolizumab 1200 mg on day 1.  This continues for 21-day cycles.  After 4 cycles the patient proceeds with atezolizumab maintenance therapy alone.    # Small Cell Lung Cancer, Extensive Stage -- MRI of the brain shows no evidence of intracranial spread --Findings are currently consistent with metastatic small cell lung cancer with metastatic spread to the lymph nodes of the abdomen -- Started carboplatin, etoposide, and atezolizumab on 11/13/2020. Transitioned to maintenance Atezolizumab on 02/22/2021.  Plan: --today is Cycle 34 Day 1 of Atezolizumab maintenance.  --labs today were reviewed and adequate for treatment. Hgb 13.1, white blood cell 10.0, MCV 91.4, platelets 211. Creatinine stable at 1.07. LFTs in range.  -- Most recent CT CAP from 01/06/2023 which shows no evidence of recurrence. Repeat scan due in March 2025.   --Proceed with treatment without any dose modifications.  --RTC in 3 weeks for a follow up before Cycle 35  # Early Cirrhosis --noted on MR abdomen from 05/22/2022. [place] with elastography performed on 06/18/2022. --continue to follow with GI.   #Right renal lesion: --Noted to be benign on most recent MRI of the abdomen.  #Diarrhea-improved --stool sent for C. Diff and GI pathology panel, all negative --patient following with GI and found to have pancreatic insufficiency and started on creon with improvement of symptoms.   #Nausea: --Symptoms improved with zofran and olanzapine.   --Added IV zofran to infusion premeds for better symptom control.   #Hypokalemia:  --currently taking potassium chloride 20 meq BID.  Will administer IV  potassium today. --potassium level is still 3.0 today. Continue PO supplementation.   #Lower extremity edema --Currently takes lasix 20 mg 1-2 times a day. --Stable. Continue to monitor.   #Neuropathy involving fingers/feet: --Patient currently takes Cymbalta   #Supportive Care -- chemotherapy education complete -- port placed -- zofran 8mg  q8H PRN and compazine 10mg  PO q6H for nausea -- EMLA cream for port  No orders of the defined types were placed in this encounter.  All questions were answered. The patient knows to call the clinic with any problems, questions or concerns.  I have spent a total of 30 minutes minutes of face-to-face and non-face-to-face time, preparing to see the patient, performing a medically appropriate examination, counseling and educating the patient, ordering medications/tests, documenting clinical information in the electronic health record, and care coordination.   Ulysees Barns, MD Department of Hematology/Oncology [facility] Phone: [phone] Pager: [phone] Email: [email or web address]  01/15/2023 11:25 AM

## 2023-01-16 ENCOUNTER — Other Ambulatory Visit: Payer: Self-pay | Admitting: Hematology and Oncology

## 2023-01-16 DIAGNOSIS — B370 Candidal stomatitis: Secondary | ICD-10-CM

## 2023-01-16 LAB — T4: T4, Total: 8.9 ug/dL (ref 4.5–12.0)

## 2023-01-20 ENCOUNTER — Other Ambulatory Visit: Payer: Self-pay | Admitting: Physical Medicine and Rehabilitation

## 2023-01-23 ENCOUNTER — Other Ambulatory Visit: Payer: Self-pay | Admitting: Hematology and Oncology

## 2023-01-24 ENCOUNTER — Encounter: Payer: Self-pay | Admitting: Registered Nurse

## 2023-01-24 ENCOUNTER — Encounter: Payer: 59 | Attending: Physical Medicine and Rehabilitation | Admitting: Registered Nurse

## 2023-01-24 VITALS — BP 126/81 | HR 98 | Ht 62.0 in | Wt 176.2 lb

## 2023-01-24 DIAGNOSIS — M7918 Myalgia, other site: Secondary | ICD-10-CM

## 2023-01-24 DIAGNOSIS — W19XXXD Unspecified fall, subsequent encounter: Secondary | ICD-10-CM | POA: Diagnosis not present

## 2023-01-24 DIAGNOSIS — M546 Pain in thoracic spine: Secondary | ICD-10-CM | POA: Diagnosis not present

## 2023-01-24 DIAGNOSIS — Z79891 Long term (current) use of opiate analgesic: Secondary | ICD-10-CM

## 2023-01-24 DIAGNOSIS — G8929 Other chronic pain: Secondary | ICD-10-CM | POA: Diagnosis not present

## 2023-01-24 DIAGNOSIS — Z760 Encounter for issue of repeat prescription: Secondary | ICD-10-CM | POA: Diagnosis not present

## 2023-01-24 DIAGNOSIS — Z5181 Encounter for therapeutic drug level monitoring: Secondary | ICD-10-CM

## 2023-01-24 DIAGNOSIS — G894 Chronic pain syndrome: Secondary | ICD-10-CM

## 2023-01-24 DIAGNOSIS — Y92009 Unspecified place in unspecified non-institutional (private) residence as the place of occurrence of the external cause: Secondary | ICD-10-CM

## 2023-01-24 DIAGNOSIS — M545 Low back pain, unspecified: Secondary | ICD-10-CM

## 2023-01-24 DIAGNOSIS — C3490 Malignant neoplasm of unspecified part of unspecified bronchus or lung: Secondary | ICD-10-CM | POA: Diagnosis not present

## 2023-01-24 NOTE — Progress Notes (Unsigned)
Subjective:    Patient ID: [name], female    DOB: [birth year], 67 y.o.   MRN: [id number]  HPI: [name] is a 67 y.o. female who returns for follow up appointment for chronic pain and medication refill. She states her  pain is located in her upper- lower back and bilateral knee pain.  She rates her pain 8. Her current exercise regime is walking and performing stretching exercises.  Ms. Penas reports on 01/01/2023 she stood up from her recliner and felt dizzy and fell on her left side, she seen her PCP and went to [facility] ED, note was reviewed.  She also reports her husband and grandson had put down a new rug, it wasn't smooth out correctly, she fell forward landed into barstool, she didn't seek medical attention. Educated on falls prevention, she verbalizes understanding.   Ms. Tipsword Morphine equivalent is 50.00 MME. She is also prescribed Alprazolam by Alyssa Allwardt .We have discussed the black box warning of using opioids and benzodiazepines. I highlighted the dangers of using these drugs together and discussed the adverse events including respiratory suppression, overdose, cognitive impairment and importance of compliance with current regimen.   Pain Inventory Average Pain 7 Pain Right Now 8 My pain is burning, stabbing, and aching  In the last 24 hours, has pain interfered with the following? General activity 7 Relation with others 8 Enjoyment of life 9 What TIME of day is your pain at its worst? morning  Sleep (in general) Poor  Pain is worse with: walking, bending, standing, and some activites Pain improves with: rest and medication Relief from Meds: 6  Family History  Problem Relation Age of Onset   COPD Mother    Heart disease Mother    Lung disease Father        Asbestosis   Heart attack Father    Heart disease Father    Cerebral aneurysm Brother    Aneurysm Brother        Brain   Drug abuse Daughter    Epilepsy Son    Alcohol abuse Son    Drug  abuse Son    Arthritis Maternal Grandmother    Heart disease Maternal Grandmother    Asthma Maternal Grandfather    Cancer Maternal Grandfather    Arthritis Paternal Grandmother    Heart disease Paternal Grandmother    Stroke Paternal Grandmother    Early death Paternal Grandfather    Heart disease Paternal Grandfather    Social History   Socioeconomic History   Marital status: Married    Spouse name: Not on file   Number of children: 2   Years of education: Not on file   Highest education level: Some college, no degree  Occupational History   Occupation: disabled   Occupation: disabled  Tobacco Use   Smoking status: Every Day    Current packs/day: 2.00    Average packs/day: 2.0 packs/day for 46.0 years (92.0 ttl pk-yrs)    Types: Cigarettes   Smokeless tobacco: Never   Tobacco comments:    2 packs of cigarettes smoked daily 12/13/21- declines smoking cessation  Vaping Use   Vaping status: Never Used  Substance and Sexual Activity   Alcohol use: No   Drug use: No   Sexual activity: Not Currently    Partners: Male  Other Topics Concern   Not on file  Social History Narrative   Right handed    Caffeine~ 2 cups per day    Lives  at home with husband (strained relationship)   Primary caretaker for disabled brother who had aneurism   Daughter died Jun 14, 2018   Social Drivers of Health   Financial Resource Strain: High Risk (01/02/2023)   Overall Financial Resource Strain (CARDIA)    Difficulty of Paying Living Expenses: Very hard  Food Insecurity: No Food Insecurity (01/02/2023)   Hunger Vital Sign    Worried About Running Out of Food in the Last Year: Never true    Ran Out of Food in the Last Year: Never true  Transportation Needs: No Transportation Needs (01/02/2023)   PRAPARE - Administrator, Civil Service (Medical): No    Lack of Transportation (Non-Medical): No  Physical Activity: Inactive (01/02/2023)   Exercise Vital Sign    Days of Exercise per  Week: 0 days    Minutes of Exercise per Session: 0 min  Stress: Stress Concern Present (01/02/2023)   [employer] of Occupational Health - Occupational Stress Questionnaire    Feeling of Stress : Very much  Social Connections: Unknown (01/02/2023)   Social Connection and Isolation Panel [NHANES]    Frequency of Communication with Friends and Family: More than three times a week    Frequency of Social Gatherings with Friends and Family: More than three times a week    Attends Religious Services: 1 to 4 times per year    Active Member of [employer] or Organizations: No    Attends Banker Meetings: Never    Marital Status: Patient declined   Past Surgical History:  Procedure Laterality Date   APPENDECTOMY     1985   BIOPSY  07/24/2017   Procedure: BIOPSY;  Surgeon: Rachael Fee, MD;  Location: [facility];  Service: Endoscopy;;   CARDIAC CATHETERIZATION N/A 10/31/2015   Procedure: Left Heart Cath and Coronary Angiography;  Surgeon: Marykay Lex, MD;  Location: [facility] INVASIVE CV LAB;  Service: Cardiovascular;  Laterality: N/A;   CARPAL TUNNEL RELEASE Left    CARPAL TUNNEL RELEASE Right    CHOLECYSTECTOMY  late 1980's   COLONOSCOPY WITH PROPOFOL N/A 07/24/2017   Procedure: COLONOSCOPY WITH PROPOFOL;  Surgeon: Rachael Fee, MD;  Location: [facility];  Service: Endoscopy;  Laterality: N/A;   ESOPHAGOGASTRODUODENOSCOPY N/A 07/24/2017   Procedure: ESOPHAGOGASTRODUODENOSCOPY (EGD);  Surgeon: Rachael Fee, MD;  Location: [facility];  Service: Endoscopy;  Laterality: N/A;   ESOPHAGOGASTRODUODENOSCOPY (EGD) WITH PROPOFOL N/A 10/19/2020   Procedure: ESOPHAGOGASTRODUODENOSCOPY (EGD) WITH PROPOFOL;  Surgeon: Rachael Fee, MD;  Location: [facility];  Service: Endoscopy;  Laterality: N/A;   FINE NEEDLE ASPIRATION N/A 10/19/2020   Procedure: FINE NEEDLE ASPIRATION (FNA) LINEAR;  Surgeon: Rachael Fee, MD;  Location: [facility];  Service: Endoscopy;  Laterality: N/A;    GALLBLADDER SURGERY  1991   HIP CLOSED REDUCTION Right 01/08/2016   Procedure: CLOSED MANIPULATION HIP;  Surgeon: Jene Every, MD;  Location: [facility];  Service: Orthopedics;  Laterality: Right;   HIP CLOSED REDUCTION Right 01/19/2016   Procedure: ATTEMPTED CLOSED REDUCTION RIGHT HIP;  Surgeon: Toni Arthurs, MD;  Location: [facility];  Service: Orthopedics;  Laterality: Right;   HIP CLOSED REDUCTION Right 01/20/2016   Procedure: CLOSED REDUCTION RIGHT TOTAL HIP;  Surgeon: Durene Romans, MD;  Location: [facility];  Service: Orthopedics;  Laterality: Right;   HIP CLOSED REDUCTION Right 02/17/2016   Procedure: CLOSED REDUCTION RIGHT TOTAL HIP;  Surgeon: Samson Frederic, MD;  Location: [facility];  Service: Orthopedics;  Laterality: Right;   HIP  CLOSED REDUCTION Right 02/28/2016   Procedure: CLOSED REDUCTION HIP;  Surgeon: Yolonda Kida, MD;  Location: [facility];  Service: Orthopedics;  Laterality: Right;   IR IMAGING GUIDED PORT INSERTION  11/01/2020   POLYPECTOMY  07/24/2017   Procedure: POLYPECTOMY;  Surgeon: Rachael Fee, MD;  Location: [facility];  Service: Endoscopy;;   TONSILLECTOMY     TOTAL ABDOMINAL HYSTERECTOMY     1985, with 1 ovary removed and 2 nd ovary removed 2003   TOTAL HIP ARTHROPLASTY Right    Original surgery 2006 with revision 2010   TOTAL HIP REVISION Right 01/01/2016   Procedure: TOTAL HIP REVISION;  Surgeon: Durene Romans, MD;  Location: [facility];  Service: Orthopedics;  Laterality: Right;   TOTAL HIP REVISION Right 03/02/2016   Procedure: TOTAL HIP REVISION;  Surgeon: Durene Romans, MD;  Location: [facility];  Service: Orthopedics;  Laterality: Right;   TOTAL HIP REVISION Right 09/02/2016   Procedure: Right hip constrained liner- posterior;  Surgeon: Durene Romans, MD;  Location: [facility];  Service: Orthopedics;  Laterality: Right;   ULNAR NERVE TRANSPOSITION Right    UPPER ESOPHAGEAL ENDOSCOPIC ULTRASOUND (EUS) N/A 10/19/2020   Procedure: UPPER ESOPHAGEAL ENDOSCOPIC ULTRASOUND (EUS);   Surgeon: Rachael Fee, MD;  Location: [facility];  Service: Endoscopy;  Laterality: N/A;  periduodenal lesion   Past Surgical History:  Procedure Laterality Date   APPENDECTOMY     1985   BIOPSY  07/24/2017   Procedure: BIOPSY;  Surgeon: Rachael Fee, MD;  Location: [facility];  Service: Endoscopy;;   CARDIAC CATHETERIZATION N/A 10/31/2015   Procedure: Left Heart Cath and Coronary Angiography;  Surgeon: Marykay Lex, MD;  Location: [facility] INVASIVE CV LAB;  Service: Cardiovascular;  Laterality: N/A;   CARPAL TUNNEL RELEASE Left    CARPAL TUNNEL RELEASE Right    CHOLECYSTECTOMY  late 1980's   COLONOSCOPY WITH PROPOFOL N/A 07/24/2017   Procedure: COLONOSCOPY WITH PROPOFOL;  Surgeon: Rachael Fee, MD;  Location: [facility];  Service: Endoscopy;  Laterality: N/A;   ESOPHAGOGASTRODUODENOSCOPY N/A 07/24/2017   Procedure: ESOPHAGOGASTRODUODENOSCOPY (EGD);  Surgeon: Rachael Fee, MD;  Location: [facility];  Service: Endoscopy;  Laterality: N/A;   ESOPHAGOGASTRODUODENOSCOPY (EGD) WITH PROPOFOL N/A 10/19/2020   Procedure: ESOPHAGOGASTRODUODENOSCOPY (EGD) WITH PROPOFOL;  Surgeon: Rachael Fee, MD;  Location: [facility];  Service: Endoscopy;  Laterality: N/A;   FINE NEEDLE ASPIRATION N/A 10/19/2020   Procedure: FINE NEEDLE ASPIRATION (FNA) LINEAR;  Surgeon: Rachael Fee, MD;  Location: [facility];  Service: Endoscopy;  Laterality: N/A;   GALLBLADDER SURGERY  1991   HIP CLOSED REDUCTION Right 01/08/2016   Procedure: CLOSED MANIPULATION HIP;  Surgeon: Jene Every, MD;  Location: [facility];  Service: Orthopedics;  Laterality: Right;   HIP CLOSED REDUCTION Right 01/19/2016   Procedure: ATTEMPTED CLOSED REDUCTION RIGHT HIP;  Surgeon: Toni Arthurs, MD;  Location: [facility];  Service: Orthopedics;  Laterality: Right;   HIP CLOSED REDUCTION Right 01/20/2016   Procedure: CLOSED REDUCTION RIGHT TOTAL HIP;  Surgeon: Durene Romans, MD;  Location: [facility];  Service: Orthopedics;  Laterality:  Right;   HIP CLOSED REDUCTION Right 02/17/2016   Procedure: CLOSED REDUCTION RIGHT TOTAL HIP;  Surgeon: Samson Frederic, MD;  Location: [facility];  Service: Orthopedics;  Laterality: Right;   HIP CLOSED REDUCTION Right 02/28/2016   Procedure: CLOSED REDUCTION HIP;  Surgeon: Yolonda Kida, MD;  Location: [facility];  Service: Orthopedics;  Laterality: Right;   IR IMAGING GUIDED PORT INSERTION  11/01/2020  POLYPECTOMY  07/24/2017   Procedure: POLYPECTOMY;  Surgeon: Rachael Fee, MD;  Location: [facility];  Service: Endoscopy;;   TONSILLECTOMY     TOTAL ABDOMINAL HYSTERECTOMY     1985, with 1 ovary removed and 2 nd ovary removed 2003   TOTAL HIP ARTHROPLASTY Right    Original surgery 2006 with revision 2010   TOTAL HIP REVISION Right 01/01/2016   Procedure: TOTAL HIP REVISION;  Surgeon: Durene Romans, MD;  Location: [facility];  Service: Orthopedics;  Laterality: Right;   TOTAL HIP REVISION Right 03/02/2016   Procedure: TOTAL HIP REVISION;  Surgeon: Durene Romans, MD;  Location: [facility];  Service: Orthopedics;  Laterality: Right;   TOTAL HIP REVISION Right 09/02/2016   Procedure: Right hip constrained liner- posterior;  Surgeon: Durene Romans, MD;  Location: [facility];  Service: Orthopedics;  Laterality: Right;   ULNAR NERVE TRANSPOSITION Right    UPPER ESOPHAGEAL ENDOSCOPIC ULTRASOUND (EUS) N/A 10/19/2020   Procedure: UPPER ESOPHAGEAL ENDOSCOPIC ULTRASOUND (EUS);  Surgeon: Rachael Fee, MD;  Location: [facility];  Service: Endoscopy;  Laterality: N/A;  periduodenal lesion   Past Medical History:  Diagnosis Date   Allergic rhinitis    Anemia    Anxiety    Chicken pox    Chronic back pain    COPD (chronic obstructive pulmonary disease) (HCC)    Depression    DM (diabetes mellitus) (HCC)    Essential hypertension    GERD (gastroesophageal reflux disease)    Headache    migraines   History of gastritis    EGD 2015   History of home oxygen therapy    2 liters at hs last 6 months    Hyperlipidemia    Hypothyroidism    Migraines    Osteoarthritis    oa   Scoliosis    Small cell lung cancer (HCC)    Stage 4   There were no vitals taken for this visit.  Opioid Risk Score:   Fall Risk Score:  `1  Depression screen [facility] 2/9     01/06/2023   12:56 PM 12/13/2022    1:42 PM 09/04/2022   11:47 AM 06/05/2022    9:42 AM 05/16/2022    1:57 PM 04/15/2022   11:45 AM 12/13/2021    2:59 PM  Depression screen PHQ 2/9  Decreased Interest 3 1 1 3 3 3 3   Down, Depressed, Hopeless 3 1 1 3 3 3 3   PHQ - 2 Score 6 2 2 6 6 6 6   Altered sleeping 1    0 0 2  Tired, decreased energy 3     3 3   Change in appetite 3    2 2  0  Feeling bad or failure about yourself  3    0 1 0  Trouble concentrating 1    0 0 1  Moving slowly or fidgety/restless 0    0 0 0  Suicidal thoughts 0    0 0 0  PHQ-9 Score 17    8 12 12   Difficult doing work/chores Somewhat difficult     Somewhat difficult Not difficult at all      Review of Systems  Musculoskeletal:  Positive for arthralgias, gait problem and myalgias.  Psychiatric/Behavioral:  Positive for dysphoric mood.   All other systems reviewed and are negative.      Objective:   Physical Exam Vitals and nursing note reviewed.  Constitutional:      Appearance: Normal appearance.  Cardiovascular:  Rate and Rhythm: Normal rate and regular rhythm.     Pulses: Normal pulses.     Heart sounds: Normal heart sounds.  Musculoskeletal:     Comments: Normal Muscle Bulk and Muscle Testing Reveals:  Upper Extremities: Full ROM and Muscle Strength 5/5 Bilateral AC Joint Tenderness  Lower Extremities : Right: Full ROM and Muscle Strength 5/5 Left Lower Extremity: Decreased ROM and Muscle Strength 5/5 Left Lower Extremity Flexion Produces Pain into her Left Patella Arises from Table Slowly Antalgic Gait     Skin:    Findings: Bruising present.     Comments: Left hip an left lower extremity with resolving ecchymosis   Neurological:     Mental  Status: She is alert.         Assessment & Plan:  Small Cell Carcinoma of Lung: Oncology Following. Continue to Monitor.  Chronic Bilateral Low Back Pain without Sciatica: Continue HEP as Tolerated. Continue to Monitor. Chronic Bilateral Thoracic Back Pain: Continue HEP as Tolerated. Continue to Monitor.  Myofascial Pain Dysfunction Syndrome: Continue Flexeril. Continue to Monitor.  Fall at Home: Educated on [employer], she verbalizes understanding. Continue to Monitor.  Chronic Pain Syndrome: Continue Hydrocodone 10/325 mg one tablet every 4 hours as needed for pain #140. We will continue the opioid monitoring program, this consists of regular clinic visits, examinations, urine drug screen, pill counts as well as use of [place] Controlled Substance Reporting system. A 12 month History has been reviewed on the [place] Controlled Substance Reporting System on 01/24/2023.

## 2023-01-26 ENCOUNTER — Encounter: Payer: Self-pay | Admitting: Physical Medicine and Rehabilitation

## 2023-01-27 ENCOUNTER — Other Ambulatory Visit: Payer: Self-pay | Admitting: Hematology and Oncology

## 2023-01-28 ENCOUNTER — Telehealth: Payer: Self-pay

## 2023-01-28 NOTE — Telephone Encounter (Signed)
(  KeyBirdie Hopes) PA Case ID #: [id number] Aripiprazole submitted

## 2023-01-30 DIAGNOSIS — H2512 Age-related nuclear cataract, left eye: Secondary | ICD-10-CM | POA: Diagnosis not present

## 2023-01-30 MED ORDER — ARIPIPRAZOLE 5 MG PO TABS: 5.0000 mg | ORAL_TABLET | Freq: Every day | ORAL | 5 refills | Status: DC

## 2023-01-30 NOTE — Telephone Encounter (Signed)
 Approved on January 28, 2023 by [employer] 2017 NCPDP Request Reference Number: [id number]. ARIPIPRAZOLE  TAB 2MG  is approved through 01/28/2024. Your patient may now fill this prescription and it will be covered. Authorization Expiration Date: 01/28/2024

## 2023-01-31 DIAGNOSIS — H2511 Age-related nuclear cataract, right eye: Secondary | ICD-10-CM | POA: Diagnosis not present

## 2023-02-04 ENCOUNTER — Encounter (HOSPITAL_COMMUNITY)
Admission: RE | Admit: 2023-02-04 | Discharge: 2023-02-04 | Disposition: A | Discharge status: Home / Self Care | Payer: 59 | Source: Ambulatory Visit | Attending: Optometry | Admitting: Optometry

## 2023-02-04 ENCOUNTER — Other Ambulatory Visit: Payer: Self-pay | Admitting: Hematology and Oncology

## 2023-02-04 ENCOUNTER — Encounter (HOSPITAL_COMMUNITY): Payer: Self-pay

## 2023-02-04 ENCOUNTER — Other Ambulatory Visit: Payer: Self-pay

## 2023-02-05 ENCOUNTER — Inpatient Hospital Stay: Payer: 59 | Attending: Hematology and Oncology

## 2023-02-05 ENCOUNTER — Other Ambulatory Visit: Payer: Self-pay | Admitting: Physician Assistant

## 2023-02-05 ENCOUNTER — Inpatient Hospital Stay: Payer: 59

## 2023-02-05 ENCOUNTER — Inpatient Hospital Stay (HOSPITAL_BASED_OUTPATIENT_CLINIC_OR_DEPARTMENT_OTHER): Payer: 59 | Admitting: Hematology and Oncology

## 2023-02-05 ENCOUNTER — Other Ambulatory Visit: Payer: Self-pay | Admitting: Hematology and Oncology

## 2023-02-05 VITALS — BP 128/68 | HR 78 | Temp 98.2°F | Resp 18 | Wt 176.0 lb

## 2023-02-05 DIAGNOSIS — I700 Atherosclerosis of aorta: Secondary | ICD-10-CM | POA: Diagnosis not present

## 2023-02-05 DIAGNOSIS — R110 Nausea: Secondary | ICD-10-CM | POA: Insufficient documentation

## 2023-02-05 DIAGNOSIS — M51369 Other intervertebral disc degeneration, lumbar region without mention of lumbar back pain or lower extremity pain: Secondary | ICD-10-CM | POA: Diagnosis not present

## 2023-02-05 DIAGNOSIS — C3431 Malignant neoplasm of lower lobe, right bronchus or lung: Secondary | ICD-10-CM

## 2023-02-05 DIAGNOSIS — Z814 Family history of other substance abuse and dependence: Secondary | ICD-10-CM | POA: Insufficient documentation

## 2023-02-05 DIAGNOSIS — E039 Hypothyroidism, unspecified: Secondary | ICD-10-CM | POA: Diagnosis not present

## 2023-02-05 DIAGNOSIS — R42 Dizziness and giddiness: Secondary | ICD-10-CM | POA: Insufficient documentation

## 2023-02-05 DIAGNOSIS — M47816 Spondylosis without myelopathy or radiculopathy, lumbar region: Secondary | ICD-10-CM | POA: Insufficient documentation

## 2023-02-05 DIAGNOSIS — F32A Depression, unspecified: Secondary | ICD-10-CM | POA: Diagnosis not present

## 2023-02-05 DIAGNOSIS — Z9049 Acquired absence of other specified parts of digestive tract: Secondary | ICD-10-CM | POA: Insufficient documentation

## 2023-02-05 DIAGNOSIS — Z8249 Family history of ischemic heart disease and other diseases of the circulatory system: Secondary | ICD-10-CM | POA: Insufficient documentation

## 2023-02-05 DIAGNOSIS — R200 Anesthesia of skin: Secondary | ICD-10-CM | POA: Diagnosis not present

## 2023-02-05 DIAGNOSIS — Z79899 Other long term (current) drug therapy: Secondary | ICD-10-CM | POA: Insufficient documentation

## 2023-02-05 DIAGNOSIS — Z90721 Acquired absence of ovaries, unilateral: Secondary | ICD-10-CM | POA: Insufficient documentation

## 2023-02-05 DIAGNOSIS — R600 Localized edema: Secondary | ICD-10-CM | POA: Insufficient documentation

## 2023-02-05 DIAGNOSIS — E785 Hyperlipidemia, unspecified: Secondary | ICD-10-CM

## 2023-02-05 DIAGNOSIS — E1140 Type 2 diabetes mellitus with diabetic neuropathy, unspecified: Secondary | ICD-10-CM | POA: Insufficient documentation

## 2023-02-05 DIAGNOSIS — Z90710 Acquired absence of both cervix and uterus: Secondary | ICD-10-CM | POA: Insufficient documentation

## 2023-02-05 DIAGNOSIS — R197 Diarrhea, unspecified: Secondary | ICD-10-CM | POA: Diagnosis not present

## 2023-02-05 DIAGNOSIS — J439 Emphysema, unspecified: Secondary | ICD-10-CM | POA: Insufficient documentation

## 2023-02-05 DIAGNOSIS — Z888 Allergy status to other drugs, medicaments and biological substances status: Secondary | ICD-10-CM | POA: Insufficient documentation

## 2023-02-05 DIAGNOSIS — Z5986 Financial insecurity: Secondary | ICD-10-CM | POA: Insufficient documentation

## 2023-02-05 DIAGNOSIS — Z809 Family history of malignant neoplasm, unspecified: Secondary | ICD-10-CM | POA: Insufficient documentation

## 2023-02-05 DIAGNOSIS — Z5112 Encounter for antineoplastic immunotherapy: Secondary | ICD-10-CM | POA: Diagnosis not present

## 2023-02-05 DIAGNOSIS — G8929 Other chronic pain: Secondary | ICD-10-CM | POA: Insufficient documentation

## 2023-02-05 DIAGNOSIS — K219 Gastro-esophageal reflux disease without esophagitis: Secondary | ICD-10-CM | POA: Diagnosis not present

## 2023-02-05 DIAGNOSIS — F17210 Nicotine dependence, cigarettes, uncomplicated: Secondary | ICD-10-CM | POA: Diagnosis not present

## 2023-02-05 DIAGNOSIS — F419 Anxiety disorder, unspecified: Secondary | ICD-10-CM | POA: Insufficient documentation

## 2023-02-05 DIAGNOSIS — K7460 Unspecified cirrhosis of liver: Secondary | ICD-10-CM | POA: Insufficient documentation

## 2023-02-05 DIAGNOSIS — Z8719 Personal history of other diseases of the digestive system: Secondary | ICD-10-CM | POA: Insufficient documentation

## 2023-02-05 DIAGNOSIS — Z79620 Long term (current) use of immunosuppressive biologic: Secondary | ICD-10-CM | POA: Insufficient documentation

## 2023-02-05 DIAGNOSIS — Z880 Allergy status to penicillin: Secondary | ICD-10-CM | POA: Insufficient documentation

## 2023-02-05 DIAGNOSIS — Z95828 Presence of other vascular implants and grafts: Secondary | ICD-10-CM

## 2023-02-05 DIAGNOSIS — Z8261 Family history of arthritis: Secondary | ICD-10-CM | POA: Insufficient documentation

## 2023-02-05 DIAGNOSIS — Z818 Family history of other mental and behavioral disorders: Secondary | ICD-10-CM | POA: Insufficient documentation

## 2023-02-05 DIAGNOSIS — Z885 Allergy status to narcotic agent status: Secondary | ICD-10-CM | POA: Insufficient documentation

## 2023-02-05 DIAGNOSIS — E876 Hypokalemia: Secondary | ICD-10-CM | POA: Diagnosis not present

## 2023-02-05 DIAGNOSIS — I10 Essential (primary) hypertension: Secondary | ICD-10-CM | POA: Diagnosis not present

## 2023-02-05 DIAGNOSIS — M48061 Spinal stenosis, lumbar region without neurogenic claudication: Secondary | ICD-10-CM | POA: Insufficient documentation

## 2023-02-05 DIAGNOSIS — Z820 Family history of epilepsy and other diseases of the nervous system: Secondary | ICD-10-CM | POA: Insufficient documentation

## 2023-02-05 DIAGNOSIS — Z8616 Personal history of COVID-19: Secondary | ICD-10-CM | POA: Insufficient documentation

## 2023-02-05 DIAGNOSIS — Z825 Family history of asthma and other chronic lower respiratory diseases: Secondary | ICD-10-CM | POA: Insufficient documentation

## 2023-02-05 DIAGNOSIS — Z882 Allergy status to sulfonamides status: Secondary | ICD-10-CM | POA: Insufficient documentation

## 2023-02-05 DIAGNOSIS — Z886 Allergy status to analgesic agent status: Secondary | ICD-10-CM | POA: Insufficient documentation

## 2023-02-05 DIAGNOSIS — Z823 Family history of stroke: Secondary | ICD-10-CM | POA: Insufficient documentation

## 2023-02-05 LAB — CMP (CANCER CENTER ONLY)
ALT: 8 U/L (ref 0–44)
AST: 13 U/L — ABNORMAL LOW (ref 15–41)
Albumin: 3.5 g/dL (ref 3.5–5.0)
Alkaline Phosphatase: 78 U/L (ref 38–126)
Anion gap: 8 (ref 5–15)
BUN: 14 mg/dL (ref 8–23)
CO2: 26 mmol/L (ref 22–32)
Calcium: 8.4 mg/dL — ABNORMAL LOW (ref 8.9–10.3)
Chloride: 104 mmol/L (ref 98–111)
Creatinine: 1.28 mg/dL — ABNORMAL HIGH (ref 0.44–1.00)
GFR, Estimated: 46 mL/min — ABNORMAL LOW (ref >60)
Glucose, Bld: 104 mg/dL — ABNORMAL HIGH (ref 70–99)
Potassium: 3.3 mmol/L — ABNORMAL LOW (ref 3.5–5.1)
Sodium: 138 mmol/L (ref 135–145)
Total Bilirubin: 0.3 mg/dL (ref 0.0–1.2)
Total Protein: 6 g/dL — ABNORMAL LOW (ref 6.5–8.1)

## 2023-02-05 LAB — CBC WITH DIFFERENTIAL (CANCER CENTER ONLY)
Abs Immature Granulocytes: 0.02 10*3/uL (ref 0.00–0.07)
Basophils Absolute: 0.1 10*3/uL (ref 0.0–0.1)
Basophils Relative: 1 %
Eosinophils Absolute: 0.5 10*3/uL (ref 0.0–0.5)
Eosinophils Relative: 6 %
HCT: 42.2 % (ref 36.0–46.0)
Hemoglobin: 13.9 g/dL (ref 12.0–15.0)
Immature Granulocytes: 0 %
Lymphocytes Relative: 24 %
Lymphs Abs: 2 10*3/uL (ref 0.7–4.0)
MCH: 31.3 pg (ref 26.0–34.0)
MCHC: 32.9 g/dL (ref 30.0–36.0)
MCV: 95 fL (ref 80.0–100.0)
Monocytes Absolute: 0.6 10*3/uL (ref 0.1–1.0)
Monocytes Relative: 7 %
Neutro Abs: 5 10*3/uL (ref 1.7–7.7)
Neutrophils Relative %: 62 %
Platelet Count: 199 10*3/uL (ref 150–400)
RBC: 4.44 MIL/uL (ref 3.87–5.11)
RDW: 15.9 % — ABNORMAL HIGH (ref 11.5–15.5)
WBC Count: 8.1 10*3/uL (ref 4.0–10.5)
nRBC: 0 % (ref 0.0–0.2)

## 2023-02-05 LAB — TSH: TSH: 3.63 u[IU]/mL (ref 0.350–4.500)

## 2023-02-05 MED ORDER — SODIUM CHLORIDE 0.9% FLUSH
10.0000 mL | Freq: Once | INTRAVENOUS | Status: AC
  Administered 2023-02-05: 10 mL

## 2023-02-05 MED ORDER — SODIUM CHLORIDE 0.9 % IV SOLN
1200.0000 mg | Freq: Once | INTRAVENOUS | Status: AC
  Administered 2023-02-05: 1200 mg via INTRAVENOUS
  Filled 2023-02-05: qty 20

## 2023-02-05 MED ORDER — SODIUM CHLORIDE 0.9 % IV SOLN
Freq: Once | INTRAVENOUS | Status: AC
Start: 2023-02-05 — End: 2023-02-05

## 2023-02-05 MED ORDER — ONDANSETRON HCL 4 MG/2ML IJ SOLN
8.0000 mg | Freq: Once | INTRAMUSCULAR | Status: AC
  Administered 2023-02-05: 8 mg via INTRAVENOUS
  Filled 2023-02-05: qty 4

## 2023-02-05 NOTE — Progress Notes (Signed)
 [facility] [phone]   [phone]  PROGRESS NOTE  Patient Care Team: Allwardt, Mardy HERO, PA-C as PCP - General (Physician Assistant) O'Neal, Darryle Ned, MD as PCP - Cardiology (Cardiology) [name], MD (Inactive) as Attending Physician (Gastroenterology) [name], MD as Consulting Physician (Orthopedic Surgery) [name], MD as Consulting Physician (Orthopedic Surgery) [name], MD (Inactive) as Consulting Physician (Neurology) Gladis Gearing, OD as Consulting Physician (Optometry) [name], MD as Consulting Physician (Neurology) [name], [facility] (Inactive) (Pharmacist)  Hematological/Oncological History # Small Cell Lung Cancer, Extensive Stage 07/05/2020: CT abdomen for lower abdominal pain. New pulmonary nodular density noted 07/06/2020: CT chest showed 2.2 cm macrolobulated right lower lobe pulmonary nodule (favored) versus pathologically enlarged infrahilar lymph node 10/13/2020: PET CT scan performed, findings show 2 cm right lower lobe lung mass is hypermetabolic and consistent with primary lung neoplasm. Additionally found hypermetabolic 17 mm soft tissue lesion between the descending duodenum and the pancreatic head  10/19/2020: EGD to biopsy hypermetabolic lymph node. Biopsy results show small cell lung cancer 10/26/2020: establish care with Dr. Federico  11/13/2020: Cycle 1 Day 1 of Carbo/Etop/Atezolizumab  12/04/2020: Cycle 2 Day 1 of Carbo/Etop/Atezolizumab  11/22-11/25/2022: admitted for E. Coli bacteremia/sepsis. Start of Cycle 3 delayed. 01/02/2021: Cycle 3 Day 1 of Carbo/Etop/Atezolizumab  01/23/2021: Cycle 4 Day 1 of Carbo/Etop/Atezolizumab  02/22/2021: Cycle 5 Day 1 of Atezolizumab  Maintenance. Delayed start due to patient's COVID infection.  03/14/2021: Cycle 6 Day 1 of Atezolizumab  Maintenance 04/05/2021: Cycle 7 Day 1 of Atezolizumab  Maintenance 04/25/2021: Cycle 8 Day 1 of  Atezolizumab  Maintenance 05/16/2021: Cycle 9 Day 1 of Atezolizumab  Maintenance 06/06/2021: Cycle 10 Day 1 of Atezolizumab  Maintenance 06/27/2021:  Cycle 11 Day 1 of Atezolizumab  Maintenance 07/18/2021: Cycle 12 Day 1 of Atezolizumab  Maintenance 08/08/2021: Cycle 13 Day 1 of Atezolizumab  Maintenance 08/29/2021: Cycle 14 Day 1 of Atezolizumab  Maintenance 09/19/2021: Cycle 15 Day 1 of Atezolizumab  Maintenance 10/10/2021: Cycle 16 Day 1 of Atezolizumab  Maintenance 10/31/2021: Cycle 17 Day 1 of Atezolizumab  Maintenance 11/21/2021: Cycle 18 Day 1 of Atezolizumab  Maintenance 12/12/2021: Cycle 19 Day 1 of Atezolizumab  Maintenance 01/02/2022: Cycle 20 Day 1 of Atezolizumab  Maintenance 01/23/2022: Cycle 21 Day 1 of Atezolizumab  Maintenance 02/13/2022: treatment HELD due to diarrhea.  03/06/2022: Cycle 22 Day 1 of Atezolizumab  Maintenance 03/27/2022: Cycle 23 Day 1 of Atezolizumab  Maintenance 04/17/2022: Cycle 24 Day 1 of Atezolizumab  Maintenance 05/08/2022:  Cycle 25 Day 1 of Atezolizumab  Maintenance (Held due to patient preference for treatment holiday) 05/29/2022: Cycle 25 Day 1 of Atezolizumab  Maintenance  06/19/2022: Cycle 26 Day 1 of Atezolizumab  Maintenance  07/12/2022: Cycle 27 Day 1 of Atezolizumab  Maintenance 07/31/2022: Cycle 28 Day 1 of Atezolizumab  Maintenance HELD per patient request for fatigue.  08/22/2022: Cycle 28 Day 1 of Atezolizumab  Maintenance 09/11/2022: Cycle 29 Day 1 of Atezolizumab  Maintenance 10/03/2022: Cycle 30 Day 1 of Atezolizumab  Maintenance 10/23/2022: Cycle 31 Day 1 of Atezolizumab  Maintenance 11/13/2022: Cycle 32 Day 1 of Atezolizumab  Maintenance 12/24/2022: Cycle 33 Day 1 of Atezolizumab  Maintenance 01/15/2023: Cycle 34 Day 1 of Atezolizumab  Maintenance  Interval History:  [name] 68 y.o. female with medical history significant for extensive stage small cell lung cancer who presents for a follow up visit. The patient's last visit was on 01/15/2023.  She presents today to start  cycle 34 of chemotherapy.   On exam today [name] reports she is accompanied by her best friend today.  She reports that she went to the [employer] for her birthday  and had chicken afraid open that gave her diarrhea.  She reports she had leftovers and he did and it happened again.  She reports that she has been taking her potassium pills, 2/day breaking them in half.  She reports that she does have dizzy spells on occasion but for the most part her energy levels are fair.  She reports her appetite has been okay.  She notes that she started on Abilify  recently at the request of her PCP and is had some issues with it emotionally.  She has been crying more and yelling more at her husband.  Encouraged her to discuss this with her primary care provider..  She reports that otherwise she remains at her baseline level of health and is willing and able to proceed with atezolizumab  therapy at this time.  She denies fevers, chills, sweats, chest pain or cough. She has no other complaints.A full 10 point ROS is listed below.   MEDICAL HISTORY:  Past Medical History:  Diagnosis Date   Allergic rhinitis    Anemia    Anxiety    Chicken pox    Chronic back pain    COPD (chronic obstructive pulmonary disease) (HCC)    Depression    DM (diabetes mellitus) (HCC)    Essential hypertension    GERD (gastroesophageal reflux disease)    Headache    migraines   History of gastritis    EGD 2015   History of home oxygen  therapy    2 liters at hs last 6 months   Hyperlipidemia    Hypothyroidism    Migraines    Osteoarthritis    oa   Scoliosis    Small cell lung cancer (HCC)    Stage 4    SURGICAL HISTORY: Past Surgical History:  Procedure Laterality Date   APPENDECTOMY     1985   BIOPSY  07/24/2017   Procedure: BIOPSY;  Surgeon: [name], MD;  Location: [facility];  Service: Endoscopy;;   CARDIAC CATHETERIZATION N/A 10/31/2015   Procedure: Left Heart Cath and Coronary Angiography;   Surgeon: Alm LELON Clay, MD;  Location: [facility] INVASIVE CV LAB;  Service: Cardiovascular;  Laterality: N/A;   CARPAL TUNNEL RELEASE Left    CARPAL TUNNEL RELEASE Right    CHOLECYSTECTOMY  late 1980's   COLONOSCOPY WITH PROPOFOL  N/A 07/24/2017   Procedure: COLONOSCOPY WITH PROPOFOL ;  Surgeon: [name], MD;  Location: [facility];  Service: Endoscopy;  Laterality: N/A;   ESOPHAGOGASTRODUODENOSCOPY N/A 07/24/2017   Procedure: ESOPHAGOGASTRODUODENOSCOPY (EGD);  Surgeon: [name], MD;  Location: [facility];  Service: Endoscopy;  Laterality: N/A;   ESOPHAGOGASTRODUODENOSCOPY (EGD) WITH PROPOFOL  N/A 10/19/2020   Procedure: ESOPHAGOGASTRODUODENOSCOPY (EGD) WITH PROPOFOL ;  Surgeon: [name], MD;  Location: [facility];  Service: Endoscopy;  Laterality: N/A;   FINE NEEDLE ASPIRATION N/A 10/19/2020   Procedure: FINE NEEDLE ASPIRATION (FNA) LINEAR;  Surgeon: [name], MD;  Location: [facility];  Service: Endoscopy;  Laterality: N/A;   GALLBLADDER SURGERY  1991   HIP CLOSED REDUCTION Right 01/08/2016   Procedure: CLOSED MANIPULATION HIP;  Surgeon: Reyes Billing, MD;  Location: [facility];  Service: Orthopedics;  Laterality: Right;   HIP CLOSED REDUCTION Right 01/19/2016   Procedure: ATTEMPTED CLOSED REDUCTION RIGHT HIP;  Surgeon: Norleen Armor, MD;  Location: [facility];  Service: Orthopedics;  Laterality: Right;   HIP CLOSED REDUCTION Right 01/20/2016   Procedure: CLOSED REDUCTION RIGHT TOTAL HIP;  Surgeon: Donnice Car, MD;  Location: [facility];  Service:  Orthopedics;  Laterality: Right;   HIP CLOSED REDUCTION Right 02/17/2016   Procedure: CLOSED REDUCTION RIGHT TOTAL HIP;  Surgeon: Redell Shoals, MD;  Location: [facility];  Service: Orthopedics;  Laterality: Right;   HIP CLOSED REDUCTION Right 02/28/2016   Procedure: CLOSED REDUCTION HIP;  Surgeon: Selinda Belvie Gosling, MD;  Location: [facility];  Service: Orthopedics;  Laterality: Right;   IR IMAGING GUIDED PORT INSERTION  11/01/2020    POLYPECTOMY  07/24/2017   Procedure: POLYPECTOMY;  Surgeon: [name], MD;  Location: [facility];  Service: Endoscopy;;   TONSILLECTOMY     TOTAL ABDOMINAL HYSTERECTOMY     1985, with 1 ovary removed and 2 nd ovary removed 2003   TOTAL HIP ARTHROPLASTY Right    Original surgery 2006 with revision 2010   TOTAL HIP REVISION Right 01/01/2016   Procedure: TOTAL HIP REVISION;  Surgeon: Donnice Car, MD;  Location: [facility];  Service: Orthopedics;  Laterality: Right;   TOTAL HIP REVISION Right 03/02/2016   Procedure: TOTAL HIP REVISION;  Surgeon: Car Donnice, MD;  Location: [facility];  Service: Orthopedics;  Laterality: Right;   TOTAL HIP REVISION Right 09/02/2016   Procedure: Right hip constrained liner- posterior;  Surgeon: Car Donnice, MD;  Location: [facility];  Service: Orthopedics;  Laterality: Right;   ULNAR NERVE TRANSPOSITION Right    UPPER ESOPHAGEAL ENDOSCOPIC ULTRASOUND (EUS) N/A 10/19/2020   Procedure: UPPER ESOPHAGEAL ENDOSCOPIC ULTRASOUND (EUS);  Surgeon: [name], MD;  Location: [facility];  Service: Endoscopy;  Laterality: N/A;  periduodenal lesion    SOCIAL HISTORY: Social History   Socioeconomic History   Marital status: Married    Spouse name: Not on file   Number of children: 2   Years of education: Not on file   Highest education level: Some college, no degree  Occupational History   Occupation: disabled   Occupation: disabled  Tobacco Use   Smoking status: Every Day    Current packs/day: 2.00    Average packs/day: 2.0 packs/day for 46.0 years (92.0 ttl pk-yrs)    Types: Cigarettes   Smokeless tobacco: Never   Tobacco comments:    2 packs of cigarettes smoked daily 12/13/21- declines smoking cessation  Vaping Use   Vaping status: Never Used  Substance and Sexual Activity   Alcohol use: No   Drug use: No   Sexual activity: Not Currently    Partners: Male  Other Topics Concern   Not on file  Social History Narrative   Right handed    Caffeine~ 2  cups per day    Lives at home with husband (strained relationship)   Primary caretaker for disabled brother who had aneurism   Daughter died 06/14/2018   Social Drivers of Corporate Investment Banker Strain: High Risk (01/02/2023)   Overall Financial Resource Strain (CARDIA)    Difficulty of Paying Living Expenses: Very hard  Food Insecurity: No Food Insecurity (01/02/2023)   Hunger Vital Sign    Worried About Running Out of Food in the Last Year: Never true    Ran Out of Food in the Last Year: Never true  Transportation Needs: No Transportation Needs (01/02/2023)   PRAPARE - Administrator, Civil Service (Medical): No    Lack of Transportation (Non-Medical): No  Physical Activity: Inactive (01/02/2023)   Exercise Vital Sign    Days of Exercise per Week: 0 days    Minutes of Exercise per Session: 0 min  Stress: Stress Concern Present (01/02/2023)  [employer] of Occupational Health - Occupational Stress Questionnaire    Feeling of Stress : Very much  Social Connections: Unknown (01/02/2023)   Social Connection and Isolation Panel [NHANES]    Frequency of Communication with Friends and Family: More than three times a week    Frequency of Social Gatherings with Friends and Family: More than three times a week    Attends Religious Services: 1 to 4 times per year    Active Member of [employer] or Organizations: No    Attends Banker Meetings: Never    Marital Status: Patient declined  Catering Manager Violence: Not At Risk (05/16/2022)   Humiliation, Afraid, Rape, and Kick questionnaire    Fear of Current or Ex-Partner: No    Emotionally Abused: No    Physically Abused: No    Sexually Abused: No    FAMILY HISTORY: Family History  Problem Relation Age of Onset   COPD Mother    Heart disease Mother    Lung disease Father        Asbestosis   Heart attack Father    Heart disease Father    Cerebral aneurysm Brother    Aneurysm Brother        Brain   Drug  abuse Daughter    Epilepsy Son    Alcohol abuse Son    Drug abuse Son    Arthritis Maternal Grandmother    Heart disease Maternal Grandmother    Asthma Maternal Grandfather    Cancer Maternal Grandfather    Arthritis Paternal Grandmother    Heart disease Paternal Grandmother    Stroke Paternal Grandmother    Early death Paternal Grandfather    Heart disease Paternal Grandfather     ALLERGIES:  is allergic to metformin  and related, nsaids, wellbutrin  [bupropion ], aleve [naproxen sodium], codeine , penicillins, and sulfonamide derivatives.  MEDICATIONS:  Current Outpatient Medications  Medication Sig Dispense Refill   albuterol  (PROAIR  HFA) 108 (90 Base) MCG/ACT inhaler 2 puffs every 4 hours as needed only  if your can't catch your breath 18 g 5   ALPRAZolam  (XANAX ) 1 MG tablet Take 1 tablet (1 mg total) by mouth 3 (three) times daily as needed for anxiety. for anxiety 90 tablet 2   ARIPiprazole  (ABILIFY ) 5 MG tablet Take 1 tablet (5 mg total) by mouth daily. 30 tablet 5   atorvastatin  (LIPITOR) 20 MG tablet TAKE 1 TABLET BY MOUTH AT BEDTIME 90 tablet 1   benzonatate  (TESSALON ) 100 MG capsule TAKE ONE CAPSULE BY MOUTH THREE TIMES DAILY 90 capsule 1   calcipotriene  (DOVONOX) 0.005 % cream Apply to affected area bid when on break from steroid cream 120 g 3   cyclobenzaprine  (FLEXERIL ) 10 MG tablet Take 10 mg by mouth 3 (three) times daily as needed.     dicyclomine  (BENTYL ) 10 MG capsule Take 1 capsule (10 mg total) by mouth every 6 (six) hours as needed for spasms. 90 capsule 3   diphenoxylate -atropine  (LOMOTIL ) 2.5-0.025 MG tablet Take 1 tablet by mouth 4 (four) times daily as needed for diarrhea or loose stools. 60 tablet 0   DULoxetine  (CYMBALTA ) 60 MG capsule TAKE TWO CAPSULES BY MOUTH EVERY DAY 180 capsule 1   FEROSUL 325 (65 Fe) MG tablet TAKE 1 TABLET BY MOUTH DAILY WITH BREAKFAST 30 tablet 3   fluticasone  (FLONASE ) 50 MCG/ACT nasal spray INSTILL 2 SPRAYS IN EACH NOSTRIL EVERY DAY  16 g 6   furosemide  (LASIX ) 20 MG tablet TAKE 1 TABLET BY MOUTH TWICE DAILY,  IF SWELLING IMPROVES CAN HOLD MEDICATION 90 tablet 0   HYDROcodone -acetaminophen  (NORCO) 10-325 MG tablet Take 1 tablet by mouth every 4 (four) hours as needed. To be filled 01/28/23 150 tablet 0   hydrOXYzine  (ATARAX ) 10 MG tablet TAKE 1 TABLET BY MOUTH THREE TIMES DAILY AS NEEDED FOR ITCHING 30 tablet 2   levothyroxine  (SYNTHROID ) 137 MCG tablet Take 1 tablet (137 mcg total) by mouth daily before breakfast. 90 tablet 3   loratadine  (CLARITIN ) 10 MG tablet Take 10 mg by mouth daily.     meclizine  (ANTIVERT ) 12.5 MG tablet TAKE 1 TABLET BY MOUTH THREE TIMES DAILY AS NEEDED FOR DIZZINESS 30 tablet 1   meloxicam  (MOBIC ) 15 MG tablet TAKE 1 TABLET BY MOUTH EVERY DAY 30 tablet 2   nystatin  (MYCOSTATIN ) 100000 UNIT/ML suspension Swish and swallow with 5 ml up to QID prn for thrush 400 mL 0   OLANZapine  (ZYPREXA ) 10 MG tablet Take 1 tablet (10 mg total) by mouth at bedtime. 30 tablet 2   omeprazole  (PRILOSEC) 40 MG capsule TAKE ONE CAPSULE BY MOUTH TWICE DAILY 180 capsule 3   ondansetron  (ZOFRAN ) 8 MG tablet TAKE 1 TABLET BY MOUTH TWICE DAILY 60 tablet 1   Pancrelipase , Lip-Prot-Amyl, (CREON  PO) Take by mouth.     permethrin  (ELIMITE ) 5 % cream      potassium chloride  SA (KLOR-CON  M) 20 MEQ tablet TAKE 1 TABLET BY MOUTH TWICE DAILY 60 tablet 2   prochlorperazine  (COMPAZINE ) 10 MG tablet Take 1 tablet (10 mg total) by mouth every 6 (six) hours as needed for nausea or vomiting. 30 tablet 0   Rimegepant Sulfate (NURTEC) 75 MG TBDP Take 1 tablet (75 mg total) by mouth daily as needed (for migraine-). Take within 15 minutes of initial symptoms 8 tablet 5   topiramate  (TOPAMAX ) 100 MG tablet Take 1 tablet (100 mg total) by mouth at bedtime. Changed to 100 mg tabs- for ease 90 tablet 1   TRELEGY ELLIPTA  200-62.5-25 MCG/ACT AEPB INHALE 1 PUFFS into lungs DAILY AT 2pm 60 each 5   triamcinolone  ointment (KENALOG ) 0.5 % Apply 1  Application topically 3 (three) times daily. Apply thin-layer on affected areas, no more than 2 weeks consistently. 60 g 2   No current facility-administered medications for this visit.   Facility-Administered Medications Ordered in Other Visits  Medication Dose Route Frequency Provider Last Rate Last Admin   atezolizumab  (TECENTRIQ ) 1,200 mg in sodium chloride  0.9 % 250 mL chemo infusion  1,200 mg Intravenous Once Rastus Borton T IV, MD        REVIEW OF SYSTEMS:   Constitutional: ( - ) fevers, ( - )  chills , ( - ) night sweats Eyes: ( - ) blurriness of vision, ( - ) double vision, ( - ) watery eyes Ears, nose, mouth, throat, and face: ( - ) mucositis, ( - ) sore throat Respiratory: ( - ) cough, ( -) dyspnea, ( - ) wheezes Cardiovascular: ( - ) palpitation, ( - ) chest discomfort, ( - ) lower extremity swelling Gastrointestinal:  ( +) nausea, ( - ) heartburn, ( - ) change in bowel habits Skin: ( - ) abnormal skin rashes Lymphatics: ( - ) new lymphadenopathy, ( - ) easy bruising Neurological: (+ ) numbness, ( - ) tingling, ( - ) new weaknesses Behavioral/Psych: ( - ) mood change, ( - ) new changes  All other systems were reviewed with the patient and are negative.  PHYSICAL EXAMINATION: ECOG PERFORMANCE STATUS: 1 -  Symptomatic but completely ambulatory  There were no vitals filed for this visit. There were no vitals filed for this visit.  GENERAL: Well-appearing middle-age Caucasian female, alert, no distress and comfortable SKIN: skin color, texture, turgor are normal, no rashes or significant lesions EYES: conjunctiva are pink and non-injected, sclera clear LUNGS:  normal breathing effort. Diffuse wheezing hear on ausculation.  HEART: regular rate & rhythm and no murmurs. Mild bilateral lower extremity edema Musculoskeletal: no cyanosis of digits and no clubbing  PSYCH: alert & oriented x 3, fluent speech NEURO: no focal motor/sensory deficits  LABORATORY DATA:  I have reviewed  the data as listed    Latest Ref Rng & Units 02/05/2023    8:31 AM 01/15/2023   10:13 AM 12/24/2022   10:31 AM  CBC  WBC 4.0 - 10.5 K/uL 8.1  10.0  9.1   Hemoglobin 12.0 - 15.0 g/dL 86.0  86.8  84.8   Hematocrit 36.0 - 46.0 % 42.2  37.2  42.2   Platelets 150 - 400 K/uL 199  211  196        Latest Ref Rng & Units 02/05/2023    8:31 AM 01/15/2023   10:13 AM 12/24/2022   10:31 AM  CMP  Glucose 70 - 99 mg/dL 895  889  98   BUN 8 - 23 mg/dL 14  12  12    Creatinine 0.44 - 1.00 mg/dL 8.71  8.92  8.81   Sodium 135 - 145 mmol/L 138  130  126   Potassium 3.5 - 5.1 mmol/L 3.3  3.0  2.7   Chloride 98 - 111 mmol/L 104  95  88   CO2 22 - 32 mmol/L 26  27  27    Calcium  8.9 - 10.3 mg/dL 8.4  8.4  8.7   Total Protein 6.5 - 8.1 g/dL 6.0  5.8  6.5   Total Bilirubin 0.0 - 1.2 mg/dL 0.3  0.5  0.5   Alkaline Phos 38 - 126 U/L 78  84  67   AST 15 - 41 U/L 13  12  15    ALT 0 - 44 U/L 8  9  7      No results found for: MPROTEIN Lab Results  Component Value Date   KPAFRELGTCHN 0.75 06/30/2014   LAMBDASER 3.78 (H) 06/30/2014   KAPLAMBRATIO 0.20 (L) 06/30/2014     RADIOGRAPHIC STUDIES: CT CHEST ABDOMEN PELVIS W CONTRAST Result Date: 01/08/2023 CLINICAL DATA:  Small-cell lung cancer. Assess treatment response. Immunotherapy. * Tracking Code: BO * EXAM: CT CHEST, ABDOMEN, AND PELVIS WITH CONTRAST TECHNIQUE: Multidetector CT imaging of the chest, abdomen and pelvis was performed following the standard protocol during bolus administration of intravenous contrast. RADIATION DOSE REDUCTION: This exam was performed according to the departmental dose-optimization program which includes automated exposure control, adjustment of the mA and/or kV according to patient size and/or use of iterative reconstruction technique. CONTRAST:  OMNIPAQUE  IOHEXOL  300 MG/ML  SOLN COMPARISON:  CT 10/21/2022. older exams as well FINDINGS: CT CHEST FINDINGS Cardiovascular: Right IJ chest port in place with tip along the SVC  right atrial junction region. Heart is nonenlarged. No pericardial effusion. Coronary artery calcifications are seen. The thoracic aorta has a normal course and caliber with mild atherosclerotic calcified plaque. Mediastinum/Nodes: Small thyroid  gland. No specific abnormal lymph node enlargement identified in the axillary regions, hilum or mediastinum. Normal caliber thoracic esophagus. Lungs/Pleura: No consolidation, pneumothorax or effusion. Apical pleural thickening. Scattered subpleural blebs identified. Scattered emphysematous lung changes as  well. No dominant mass. Musculoskeletal: Scattered degenerative changes. CT ABDOMEN PELVIS FINDINGS Hepatobiliary: No focal liver abnormality is seen. Status post cholecystectomy. Stable ectasia of the extrahepatic common duct. No intrahepatic biliary ductal dilatation. Pancreas: Moderate atrophy of the pancreas. Spleen: Normal in size without focal abnormality. Adrenals/Urinary Tract: Stable left adrenal nodule measuring 14 mm. This been characterized as an adenoma on previous examination. Preserved right adrenal gland. There is moderate atrophy of the left kidney. No collecting system dilatation or enhancing renal mass. Preserved contours of the urinary bladder. Stomach/Bowel: Moderate colonic stool identified with left-sided colonic diverticula. Large bowel on this non oral contrast examination has a normal caliber. Tortuous redundant course of the transverse colon. Appendix not well seen in the right lower quadrant but no pericecal stranding or fluid. The small bowel is nondilated. Stomach is nondilated. Vascular/Lymphatic: Diffuse vascular calcifications along the aorta and branch vessels. Normal caliber aorta and IVC. No specific abnormal lymph node enlargement identified in the abdomen and pelvis. Reproductive: Status post hysterectomy. No adnexal masses. Other: No free air or free fluid. Musculoskeletal: Curvature of the spine with moderate degenerative changes in  the lumbar region with multilevel disc bulging and stenosis. Degenerative changes of the pelvis. Streak artifact related to the patient's right hip arthroplasty obscuring surrounding tissues. There is new rounded structure in the subcutaneous fat lateral to the left hip measuring 5.7 x 3.2 cm with Hounsfield units of 58. One possibility would be a hematoma. Recommend correlation to any history of trauma or other process and short follow up to exclude underlying mass lesion. Please correlate with recent x-rays. IMPRESSION: Developing mass lesion, fluid collection or lymph node enlargement in the chest, abdomen or pelvis. Extensive colonic stool.  Few colonic diverticula. New rounded soft tissue density structure in the subcutaneous fat lateral to the left hip measuring 5.7 x 3.2 cm. This could be a focal hematoma. Please correlate with the history of injury. However recommend follow to confirm resolution and exclude underlying mass lesion or secondary pathology. Electronically Signed   By: Ranell Bring M.D.   On: 01/08/2023 16:40    ASSESSMENT & PLAN ESSENCE MERLE 68 y.o. female with medical history significant for extensive stage small cell lung cancer who presents for a follow up visit.   After review of the labs, review of the records, and discussion with the patient the patients findings are most consistent with extensive stage small cell lung cancer with metastasis from the right lower lobe to the lymph nodes of the abdomen.  At this time we [name] pursue triple therapy with carboplatin , etoposide , and atezolizumab .  After 4 cycles we [name] convert to maintenance atezolizumab  alone.  We previously discussed the risks and benefits of this therapy and the patient was in agreement to proceed with this treatment.  The treatment of choice consist of carboplatin , etoposide , and atezolizumab .  The regimen consists of carboplatin  AUC of 5 IV on day 1, etoposide  100 mg per metered squared IV on day 1, 2, and 3 and  atezolizumab  1200 mg on day 1.  This continues for 21-day cycles.  After 4 cycles the patient proceeds with atezolizumab  maintenance therapy alone.    # Small Cell Lung Cancer, Extensive Stage -- MRI of the brain shows no evidence of intracranial spread --Findings are currently consistent with metastatic small cell lung cancer with metastatic spread to the lymph nodes of the abdomen -- Started carboplatin , etoposide , and atezolizumab  on 11/13/2020. Transitioned to maintenance Atezolizumab  on 02/22/2021.  Plan: --today is  Cycle 35 Day 1 of Atezolizumab  maintenance.  --labs today were reviewed and adequate for treatment. Hgb 13.9, white blood cell 8.1, MCV 95, platelets 199. Creatinine stable at 1.28. LFTs in range.  -- Most recent CT CAP from 01/06/2023 which shows no evidence of recurrence. Repeat scan due in March 2025.   --Proceed with treatment without any dose modifications.  --RTC in 3 weeks for a follow up before Cycle 36  # Early Cirrhosis --noted on MR abdomen from 05/22/2022. US  with elastography performed on 06/18/2022. --continue to follow with GI.   #Right renal lesion: --Noted to be benign on most recent MRI of the abdomen.  #Diarrhea-improved --stool sent for C. Diff and GI pathology panel, all negative --patient following with GI and found to have pancreatic insufficiency and started on creon  with improvement of symptoms.   #Nausea: --Symptoms improved with zofran  and olanzapine .  --Added IV zofran  to infusion premeds for better symptom control.   #Hypokalemia:  --currently taking potassium chloride  20 meq BID.  [name] administer IV potassium today. --potassium level is still 3.3 today. Continue PO supplementation.   #Lower extremity edema --Currently takes lasix  20 mg 1-2 times a day. --Stable. Continue to monitor.   #Neuropathy involving fingers/feet: --Patient currently takes Cymbalta    #Supportive Care -- chemotherapy education complete -- port placed -- zofran   8mg  q8H PRN and compazine  10mg  PO q6H for nausea -- EMLA  cream for port  No orders of the defined types were placed in this encounter.  All questions were answered. The patient knows to call the clinic with any problems, questions or concerns.  I have spent a total of 30 minutes minutes of face-to-face and non-face-to-face time, preparing to see the patient, performing a medically appropriate examination, counseling and educating the patient, ordering medications/tests, documenting clinical information in the electronic health record, and care coordination.   Norleen IVAR Kidney, MD Department of Hematology/Oncology [facility] Phone: [phone] Pager: [phone] Email: [email or web address]  02/05/2023 10:26 AM

## 2023-02-05 NOTE — Patient Instructions (Signed)
 [facility] - A DEPT OF [facility]  Discharge Instructions: Thank you for choosing [facility] to provide your oncology and hematology care.   If you have a lab appointment with the Cancer Center, please go directly to the Cancer Center and check in at the registration area.   Wear comfortable clothing and clothing appropriate for easy access to any Portacath or PICC line.   We strive to give you quality time with your provider. You may need to reschedule your appointment if you arrive late (15 or more minutes).  Arriving late affects you and other patients whose appointments are after yours.  Also, if you miss three or more appointments without notifying the office, you may be dismissed from the clinic at the provider's discretion.      For prescription refill requests, have your pharmacy contact our office and allow 72 hours for refills to be completed.    Today you received the following chemotherapy and/or immunotherapy agents: atezolizumab      To help prevent nausea and vomiting after your treatment, we encourage you to take your nausea medication as directed.  BELOW ARE SYMPTOMS THAT SHOULD BE REPORTED IMMEDIATELY: *FEVER GREATER THAN 100.4 F (38 C) OR HIGHER *CHILLS OR SWEATING *NAUSEA AND VOMITING THAT IS NOT CONTROLLED WITH YOUR NAUSEA MEDICATION *UNUSUAL SHORTNESS OF BREATH *UNUSUAL BRUISING OR BLEEDING *URINARY PROBLEMS (pain or burning when urinating, or frequent urination) *BOWEL PROBLEMS (unusual diarrhea, constipation, pain near the anus) TENDERNESS IN MOUTH AND THROAT WITH OR WITHOUT PRESENCE OF ULCERS (sore throat, sores in mouth, or a toothache) UNUSUAL RASH, SWELLING OR PAIN  UNUSUAL VAGINAL DISCHARGE OR ITCHING   Items with * indicate a potential emergency and should be followed up as soon as possible or go to the Emergency Department if any problems should occur.  Please show the CHEMOTHERAPY ALERT CARD or  IMMUNOTHERAPY ALERT CARD at check-in to the Emergency Department and triage nurse.  Should you have questions after your visit or need to cancel or reschedule your appointment, please contact [facility] - A DEPT OF [facility]  Dept: [phone]  and follow the prompts.  Office hours are 8:00 a.m. to 4:30 p.m. Monday - Friday. Please note that voicemails left after 4:00 p.m. may not be returned until the following business day.  We are closed weekends and major holidays. You have access to a nurse at all times for urgent questions. Please call the main number to the clinic Dept: [phone] and follow the prompts.   For any non-urgent questions, you may also contact your provider using MyChart. We now offer e-Visits for anyone 44 and older to request care online for non-urgent symptoms. For details visit [email or web address].   Also download the MyChart app! Go to the app store, search "MyChart", open the app, select [facility], and log in with your MyChart username and password.

## 2023-02-05 NOTE — H&P (Signed)
 Surgical History & Physical  Patient Name: [name]  DOB: 12-Jan-1956  Surgery: Cataract extraction with intraocular lens implant phacoemulsification; Left Eye Surgeon: Marsa Cleverly MD Surgery Date: 02/07/2023 Pre-Op Date: 01/08/2023  HPI: A 72 Yr. old female patient present for cataract eval. 1. The patient complains of difficulty when driving during the day or at night, reading fine print, all activities of daily living, which began 6 months ago. Both eyes are affected, OS>OD. The episode is constant. The condition's severity is worsening. This is negatively affecting the patient's quality of life and the patient is unable to function adequately in life with the current level of vision. OS burns as well. Using ATs prn.  Medical History: Macula Degeneration Cataracts Choroidal Nevus OS Anxiety, Depression, Acid Reflux, beginning stages of cirrhosis Cancer High Blood Pressure LDL Lung Problems Thyroid  Problems  Review of Systems Negative Allergic/Immunologic Hypertension Cardiovascular Negative Constitutional Negative Ear, Nose, Mouth & Throat Thyroid  disease Endocrine Negative Eyes Acid reflux Gastrointestinal Negative Genitourinary Negative Hemotologic/Lymphatic Negative Integumentary Negative Musculoskeletal Negative Neurological Anxiety, depression Psychiatry Lung problems Respiratory  Social Current every day smoker of Cigarettes   Medication Systane,  fluconazole  ,  levothyroxine  ,  atorvastatin  ,  meloxicam  ,  topiramate  ,  hydrocodone -acetaminophen  ,  omeprazole  ,  potassium chloride  ,  triamterene -hydrochlorothiazid ,  benzonatate  ,  albuterol  sulfate ,  hydroxyzine  HCl ,  topiramate  ,  alprazolam  ,  furosemide  ,  meclizine  ,  dicyclomine  ,  duloxetine  ,  FeroSul ,  aripiprazole  ,  Trelegy Ellipta  ,  Nurtec ODT   Sx/Procedures Cholecystectomy, Hysterectomy, Tonsillectomy, Right hip joint replacement x3, Elbow sx  Drug Allergies  Penicillin, Sulfa,  Codeine   History & Physical: Heent: cataract, AMD NECK: supple without bruits LUNGS: lungs clear to auscultation CV: regular rate and rhythm Abdomen: soft and non-tender  Impression & Plan: Assessment: 1.  CATARACT AGE-RELATED NUCLEAR; Both Eyes (H25.13) 2.  AMD- WET; Left Eye Active neovascularization (H35.3221) 3.  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  Cataracts are visually significant and account for the patient's complaints. Discussed all risks, benefits, procedures and recovery, including infection, loss of vision and eye, need for glasses after surgery or additional procedures. Patient understands changing glasses will not improve vision. Patient indicated understanding of procedure. All questions answered. Patient desires to have surgery, recommend phacoemulsification with intraocular lens. Patient to have preliminary testing necessary (Argos/IOL Master, Mac OCT, TOPO) Educational materials provided: Cataract.  Plan: - Proceed with cataract surgery OS followed by OD - Plan for best distance target with DIB00 - No DM, no fuchs, no prior eye surgery - good dilation - Stage IV Lung CA also with AMD limiting vision  2.  Many drusen along superior and inferior arcades. 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 Minimal change over the past year.  Discussed treatment options and recommended referral to retina specialist for treatment however patient would prefer to avoid this as she is undergoing treatment for stage 4 lung CA and has minimal visual complaints. Will monitor for now.  3.  No central involvement, no GA. Discussed areds, risks with smoking, and encouraged healthy diet with green leafy vegetables. Monitor.  4.  Findings, prognosis and treatment options reviewed. Begin basic treatment regimen; warm compresses, lid scrubs,  ointment QHS, and preserved artificial tears QID.

## 2023-02-06 ENCOUNTER — Encounter: Payer: Self-pay | Admitting: Hematology and Oncology

## 2023-02-06 LAB — T4: T4, Total: 8.4 ug/dL (ref 4.5–12.0)

## 2023-02-07 ENCOUNTER — Ambulatory Visit (HOSPITAL_COMMUNITY): Payer: 59 | Admitting: Anesthesiology

## 2023-02-07 ENCOUNTER — Encounter (HOSPITAL_COMMUNITY): Payer: Self-pay | Admitting: Optometry

## 2023-02-07 ENCOUNTER — Encounter (HOSPITAL_COMMUNITY)
Admission: RE | Disposition: A | Discharge status: Home / Self Care | Payer: Self-pay | Source: Home / Self Care | Attending: Optometry

## 2023-02-07 ENCOUNTER — Ambulatory Visit (HOSPITAL_COMMUNITY)
Admission: RE | Admit: 2023-02-07 | Discharge: 2023-02-07 | Disposition: A | Discharge status: Home / Self Care | Payer: 59 | Attending: Optometry | Admitting: Optometry

## 2023-02-07 ENCOUNTER — Other Ambulatory Visit: Payer: Self-pay

## 2023-02-07 DIAGNOSIS — I10 Essential (primary) hypertension: Secondary | ICD-10-CM

## 2023-02-07 DIAGNOSIS — H353221 Exudative age-related macular degeneration, left eye, with active choroidal neovascularization: Secondary | ICD-10-CM | POA: Diagnosis not present

## 2023-02-07 DIAGNOSIS — F17210 Nicotine dependence, cigarettes, uncomplicated: Secondary | ICD-10-CM

## 2023-02-07 DIAGNOSIS — H353112 Nonexudative age-related macular degeneration, right eye, intermediate dry stage: Secondary | ICD-10-CM | POA: Insufficient documentation

## 2023-02-07 DIAGNOSIS — H2512 Age-related nuclear cataract, left eye: Secondary | ICD-10-CM

## 2023-02-07 DIAGNOSIS — E1136 Type 2 diabetes mellitus with diabetic cataract: Secondary | ICD-10-CM | POA: Diagnosis not present

## 2023-02-07 DIAGNOSIS — H2511 Age-related nuclear cataract, right eye: Secondary | ICD-10-CM | POA: Diagnosis not present

## 2023-02-07 DIAGNOSIS — H04123 Dry eye syndrome of bilateral lacrimal glands: Secondary | ICD-10-CM | POA: Diagnosis not present

## 2023-02-07 DIAGNOSIS — E039 Hypothyroidism, unspecified: Secondary | ICD-10-CM | POA: Diagnosis not present

## 2023-02-07 HISTORY — PX: CATARACT EXTRACTION W/PHACO: SHX586

## 2023-02-07 LAB — GLUCOSE, CAPILLARY: Glucose-Capillary: 109 mg/dL — ABNORMAL HIGH (ref 70–99)

## 2023-02-07 SURGERY — PHACOEMULSIFICATION, CATARACT, WITH IOL INSERTION
Anesthesia: Monitor Anesthesia Care | Site: Eye | Laterality: Left

## 2023-02-07 MED ORDER — PHENYLEPHRINE-KETOROLAC 1-0.3 % IO SOLN
INTRAOCULAR | Status: DC | PRN
  Administered 2023-02-07: 500 mL via OPHTHALMIC

## 2023-02-07 MED ORDER — LIDOCAINE HCL (PF) 1 % IJ SOLN
INTRAMUSCULAR | Status: DC | PRN
  Administered 2023-02-07: 1 mL

## 2023-02-07 MED ORDER — TROPICAMIDE 1 % OP SOLN
1.0000 [drp] | OPHTHALMIC | Status: AC | PRN
  Administered 2023-02-07 (×3): 1 [drp] via OPHTHALMIC

## 2023-02-07 MED ORDER — PHENYLEPHRINE HCL 2.5 % OP SOLN
1.0000 [drp] | OPHTHALMIC | Status: AC | PRN
  Administered 2023-02-07 (×3): 1 [drp] via OPHTHALMIC

## 2023-02-07 MED ORDER — LIDOCAINE HCL 3.5 % OP GEL
1.0000 | Freq: Once | OPHTHALMIC | Status: AC
  Administered 2023-02-07: 1 via OPHTHALMIC

## 2023-02-07 MED ORDER — TETRACAINE HCL 0.5 % OP SOLN
1.0000 [drp] | OPHTHALMIC | Status: AC | PRN
  Administered 2023-02-07 (×3): 1 [drp] via OPHTHALMIC

## 2023-02-07 MED ORDER — SODIUM CHLORIDE 0.9% FLUSH
3.0000 mL | Freq: Two times a day (BID) | INTRAVENOUS | Status: DC
  Administered 2023-02-07: 5 mL via INTRAVENOUS

## 2023-02-07 MED ORDER — MOXIFLOXACIN HCL 5 MG/ML IO SOLN
INTRAOCULAR | Status: DC | PRN
  Administered 2023-02-07: .3 mL via INTRACAMERAL

## 2023-02-07 MED ORDER — MIDAZOLAM HCL 5 MG/5ML IJ SOLN
INTRAMUSCULAR | Status: DC | PRN
  Administered 2023-02-07: 2 mg via INTRAVENOUS

## 2023-02-07 MED ORDER — SODIUM CHLORIDE 0.9% FLUSH: 3.0000 mL | INTRAVENOUS | Status: DC | PRN

## 2023-02-07 MED ORDER — STERILE WATER FOR IRRIGATION IR SOLN
Status: DC | PRN
  Administered 2023-02-07: 25 mL

## 2023-02-07 MED ORDER — SIGHTPATH DOSE#1 NA HYALUR & NA CHOND-NA HYALUR IO KIT
PACK | INTRAOCULAR | Status: DC | PRN
  Administered 2023-02-07: 1 via OPHTHALMIC

## 2023-02-07 MED ORDER — POVIDONE-IODINE 5 % OP SOLN
OPHTHALMIC | Status: DC | PRN
  Administered 2023-02-07: 1 via OPHTHALMIC

## 2023-02-07 MED ORDER — BSS IO SOLN
INTRAOCULAR | Status: DC | PRN
  Administered 2023-02-07: 15 mL via INTRAOCULAR

## 2023-02-07 MED ORDER — MIDAZOLAM HCL 2 MG/2ML IJ SOLN
INTRAMUSCULAR | Status: AC
Start: 2023-02-07 — End: ?
  Filled 2023-02-07: qty 2

## 2023-02-07 SURGICAL SUPPLY — 15 items
CATARACT SUITE SIGHTPATH (MISCELLANEOUS) ×1
CLOTH BEACON ORANGE TIMEOUT ST (SAFETY) ×1 IMPLANT
DRSG TEGADERM 4X4.75 (GAUZE/BANDAGES/DRESSINGS) ×1 IMPLANT
EYE SHIELD UNIVERSAL CLEAR (GAUZE/BANDAGES/DRESSINGS) IMPLANT
FEE CATARACT SUITE SIGHTPATH (MISCELLANEOUS) ×1 IMPLANT
GLOVE BIOGEL PI IND STRL 6.5 (GLOVE) IMPLANT
GLOVE BIOGEL PI IND STRL 7.0 (GLOVE) ×2 IMPLANT
LENS IOL TECNIS EYHANCE 23.0 (Intraocular Lens) IMPLANT
NDL HYPO 18GX1.5 BLUNT FILL (NEEDLE) ×1 IMPLANT
NEEDLE HYPO 18GX1.5 BLUNT FILL (NEEDLE) ×1
PAD ARMBOARD 7.5X6 YLW CONV (MISCELLANEOUS) ×1 IMPLANT
POSITIONER HEAD 8X9X4 ADT (SOFTGOODS) ×1 IMPLANT
SYR TB 1ML LL NO SAFETY (SYRINGE) ×1 IMPLANT
TAPE SURG TRANSPORE 1 IN (GAUZE/BANDAGES/DRESSINGS) IMPLANT
WATER STERILE IRR 250ML POUR (IV SOLUTION) ×1 IMPLANT

## 2023-02-07 NOTE — Interval H&P Note (Signed)
 History and Physical Interval Note:  02/07/2023 8:24 AM  The H and P was reviewed and updated. The patient was examined.  No changes were found after exam.  The surgical eye was marked.  [name]

## 2023-02-07 NOTE — Anesthesia Postprocedure Evaluation (Signed)
 Anesthesia Post Note  Patient: [name]  Procedure(s) Performed: CATARACT EXTRACTION PHACO AND INTRAOCULAR LENS PLACEMENT (IOC) (Left: Eye)  Patient location during evaluation: Short Stay Anesthesia Type: MAC Level of consciousness: awake Pain management: pain level controlled Vital Signs Assessment: post-procedure vital signs reviewed and stable Respiratory status: spontaneous breathing Cardiovascular status: stable Postop Assessment: no apparent nausea or vomiting Anesthetic complications: no   No notable events documented.   Last Vitals:  Vitals:   02/07/23 0804 02/07/23 0928  BP: (!) 83/68 111/73  Pulse: 95 87  Resp: 20 18  Temp: 36.6 C 36.6 C  SpO2: 97% 97%    Last Pain:  Vitals:   02/07/23 0928  TempSrc: Oral  PainSc: 0-No pain                 Suman Trivedi

## 2023-02-07 NOTE — Discharge Instructions (Signed)
 Please discharge patient when stable, will follow up today with Dr. Ilsa Iha at the [facility] office immediately following discharge.  Leave shield in place until visit.  All paperwork with discharge instructions will be given at the office.  [facility] Address:  [address]  [place], [place] [address]  Dr. Chaya Jan Phone: [phone]

## 2023-02-07 NOTE — Transfer of Care (Signed)
 Immediate Anesthesia Transfer of Care Note  Patient: [name]  Procedure(s) Performed: CATARACT EXTRACTION PHACO AND INTRAOCULAR LENS PLACEMENT (IOC) (Left: Eye)  Patient Location: Short Stay  Anesthesia Type:MAC  Level of Consciousness: awake  Airway & Oxygen  Therapy: Patient Spontanous Breathing  Post-op Assessment: Report given to RN  Post vital signs: Reviewed and stable  Last Vitals:  Vitals Value Taken Time  BP 111/73 02/07/23 0928  Temp 36.6 C 02/07/23 0928  Pulse 87 02/07/23 0928  Resp 18 02/07/23 0928  SpO2 97 % 02/07/23 0928    Last Pain:  Vitals:   02/07/23 0928  TempSrc: Oral  PainSc: 0-No pain      Patients Stated Pain Goal: 3 (02/07/23 0828)  Complications: No notable events documented.

## 2023-02-07 NOTE — Anesthesia Preprocedure Evaluation (Signed)
 Anesthesia Evaluation  Patient identified by MRN, date of birth, ID band Patient awake    Reviewed: Allergy & Precautions, H&P , NPO status , Patient's Chart, lab work & pertinent test results, reviewed documented beta blocker date and time   Airway Mallampati: II  TM Distance: >3 FB Neck ROM: full    Dental no notable dental hx.    Pulmonary neg pulmonary ROS, Current Smoker   Pulmonary exam normal breath sounds clear to auscultation       Cardiovascular Exercise Tolerance: Good hypertension, negative cardio ROS  Rhythm:regular Rate:Normal     Neuro/Psych negative neurological ROS  negative psych ROS   GI/Hepatic negative GI ROS, Neg liver ROS,,,  Endo/Other  negative endocrine ROSdiabetes    Renal/GU negative Renal ROS  negative genitourinary   Musculoskeletal   Abdominal   Peds  Hematology negative hematology ROS (+)   Anesthesia Other Findings   Reproductive/Obstetrics negative OB ROS                              Anesthesia Physical Anesthesia Plan  ASA: 3  Anesthesia Plan: MAC   Post-op Pain Management:    Induction:   PONV Risk Score and Plan:   Airway Management Planned:   Additional Equipment:   Intra-op Plan:   Post-operative Plan:   Informed Consent: I have reviewed the patients History and Physical, chart, labs and discussed the procedure including the risks, benefits and alternatives for the proposed anesthesia with the patient or authorized representative who has indicated his/her understanding and acceptance.     Dental Advisory Given  Plan Discussed with: CRNA  Anesthesia Plan Comments:          Anesthesia Quick Evaluation

## 2023-02-07 NOTE — Op Note (Signed)
 Date of procedure: 02/07/23  Pre-operative diagnosis: Visually significant age-related nuclear cataract, Left Eye (H25.12)  Post-operative diagnosis: Visually significant age-related nuclear cataract, Left Eye  Procedure: Removal of cataract via phacoemulsification and insertion of intra-ocular lens J&J DIB00 +23.0D into the capsular bag of the Left Eye  Attending surgeon: Marsa JINNY Cleverly, MD  Anesthesia: MAC, Topical Akten   Complications: None  Estimated Blood Loss: <59mL (minimal)  Specimens: None  Implants:  Implant Name Type Inv. Item Serial No. Manufacturer Lot No. LRB No. Used Action  LENS IOL TECNIS EYHANCE 23.0 - [id number] Intraocular Lens LENS IOL TECNIS EYHANCE 23.0 [id number] [employer]  Left 1 Implanted    Indications:  Visually significant age-related cataract, Left Eye  Procedure:  The patient was seen and identified in the pre-operative area. The operative eye was identified and dilated.  The operative eye was marked.  Topical anesthesia was administered to the operative eye.     The patient was then to the operative suite and placed in the supine position.  A timeout was performed confirming the patient, procedure to be performed, and all other relevant information.   The patient's face was prepped and draped in the usual fashion for intra-ocular surgery.  A lid speculum was placed into the operative eye and the surgical microscope moved into place and focused.  An inferotemporal paracentesis was created using a 20 gauge paracentesis blade.  BSS mixed with Omidria , followed by 1% lidocaine  was injected into the anterior chamber.  Viscoelastic was injected into the anterior chamber.  A temporal clear-corneal main wound incision was created using a 2.93mm microkeratome.  A continuous curvilinear capsulorrhexis was initiated using an irrigating cystitome and completed using capsulorrhexis forceps.  Hydrodissection and hydrodeliniation were performed.  Viscoelastic was  injected into the anterior chamber.  A phacoemulsification handpiece and a chopper as a second instrument were used to remove the nucleus and epinucleus. The irrigation/aspiration handpiece was used to remove any remaining cortical material.   The capsular bag was reinflated with viscoelastic, checked, and found to be intact.  The intraocular lens was inserted into the capsular bag.  The irrigation/aspiration handpiece was used to remove any remaining viscoelastic.  The clear corneal wound and paracentesis wounds were then hydrated and checked with Weck-Cels to be watertight. Moxifloxacin  was instilled into the anterior chamber.  The lid-speculum and drape was removed, and the patient's face was cleaned with a wet and dry 4x4.  A clear shield was taped over the eye. The patient was taken to the post-operative care unit in good condition, having tolerated the procedure well.  Post-Op Instructions: The patient will follow up at [facility] for a same day post-operative evaluation and will receive all other orders and instructions.

## 2023-02-08 ENCOUNTER — Encounter (HOSPITAL_COMMUNITY): Payer: Self-pay | Admitting: Optometry

## 2023-02-16 ENCOUNTER — Other Ambulatory Visit: Payer: Self-pay | Admitting: Hematology and Oncology

## 2023-02-16 ENCOUNTER — Other Ambulatory Visit: Payer: Self-pay | Admitting: Physician Assistant

## 2023-02-17 ENCOUNTER — Ambulatory Visit (HOSPITAL_BASED_OUTPATIENT_CLINIC_OR_DEPARTMENT_OTHER): Payer: 59 | Admitting: Pulmonary Disease

## 2023-02-17 ENCOUNTER — Encounter (HOSPITAL_BASED_OUTPATIENT_CLINIC_OR_DEPARTMENT_OTHER): Payer: Self-pay | Admitting: Pulmonary Disease

## 2023-02-17 VITALS — BP 132/82 | HR 90 | Ht 62.0 in | Wt 169.0 lb

## 2023-02-17 DIAGNOSIS — Z720 Tobacco use: Secondary | ICD-10-CM | POA: Diagnosis not present

## 2023-02-17 DIAGNOSIS — J449 Chronic obstructive pulmonary disease, unspecified: Secondary | ICD-10-CM | POA: Diagnosis not present

## 2023-02-17 DIAGNOSIS — T17908A Unspecified foreign body in respiratory tract, part unspecified causing other injury, initial encounter: Secondary | ICD-10-CM

## 2023-02-17 DIAGNOSIS — R053 Chronic cough: Secondary | ICD-10-CM

## 2023-02-17 MED ORDER — TRELEGY ELLIPTA 200-62.5-25 MCG/ACT IN AEPB: 1.0000 | INHALATION_SPRAY | Freq: Every day | RESPIRATORY_TRACT | 11 refills | Status: DC

## 2023-02-17 NOTE — Progress Notes (Signed)
@Patient  ID: [name], female    DOB: Mar 21, 1955, 68 y.o.   MRN: [id number]  Chief Complaint  Patient presents with   COPD    [name] is a 68 year old female active smoker with COPD and chronic respiratory failure, scoliosis and DDD who presents for follow-up.  Since our last visit she was started on Trelegy. She reports shortness of breath is occurring more frequently and seems to occur in morning and late evenings. Wheezing and coughing with yellow-gray sputum are stable. Reports upper chest congestion. She has been checking her oxygen 84-88% in the morning. She reports confusion in the mornings.  At baseline, she has shortness of breath with talking and short distances. She is able to climb stairs one step at a time and goes slow pace. Her activity is limited due to back and bilateral hip pain s/p multiple hip surgeries. Endorses wheezing daily. Has chronic cough with some yellow-gray sputum production. She is not compliant with her home oxygen for > 1 year due to difficulty tolerate the tubing. She has tried Spiriva which she tolerated however insurance did not pay for it. Nebulizers were tolerable however the setup and time spent on the machine was cumbersome.  11/10/20 Since our last visit, she was diagnosed with small cell carcinoma of lung confirmed via EUS. She is scheduled for treatment starting 11/13/20.   She reports she is coughing with chest congestion. She has decreased her cigarettes from 2.5 ppd cigarettes to 1.5 ppd. Denies wheezing. She is compliant with her Trelegy. Symptoms worsen at night. She uses her rescue inhaler 2-3 times a week.  04/10/21 Since our last visit she has completed chemotherapy and now on IT. Has some nausea. She is compliant with her Trelegy but has run out due to cost (>$100). She has shortness of breath and cough off the inhaler. She is compliant with nocturnal oxygen and currently happy with her current insurance company. She continues  to smoke 2 ppd due to home stressors.  03/08/22 Since our last visit she has been on Atezolizumab maintenance for extensive stage small cell lung cancer. This is currently held due to diarrhea. Since our last visit she is on [place]. Uses albuterol three times a month. Reports congestion in chest, shortness of breath and daily wheeze. Does not perform regular activity. She reports active smoking 2 ppd. Not ready to quit. She is not compliant with her oxygen at night. She feels it is dangerous to use because the tubing catches on her neck. Has to use her walker and worried this will interfere it.  09/13/22 Since our last visit she was prescribed Breztri but changed to Trelegy 200 on 06/18/22. Using albuterol three times a week. She no longer wears oxygen at night and felt like she was choking on it. She reports that she gets choked while swallowing for 6- 8 months. She reports feeling short of breath with frequent coughing during meals especially. She has even slowed down her eating more than usual.   02/17/23 Since our last visit she has been on Trelegy. Using albuterol once a week for shortness of breath and productive coughing. Uses robitussin often. Continues to smoke 2ppd and not interested in quitting. No exacerbations. Present with friend - previously friends with daughter.  Social History: Active smoker Daughter passed from OD two years ago Currently has grandson living with her, stressor  Past Medical History:  Diagnosis Date   Allergic rhinitis    Anemia    Anxiety  Chicken pox    Chronic back pain    COPD (chronic obstructive pulmonary disease) (HCC)    Depression    DM (diabetes mellitus) (HCC)    Essential hypertension    GERD (gastroesophageal reflux disease)    Headache    migraines   History of gastritis    EGD 2015   History of home oxygen therapy    2 liters at hs last 6 months   Hyperlipidemia    Hypothyroidism    Migraines    Osteoarthritis    oa   Scoliosis     Small cell lung cancer (HCC)    Stage 4    Outpatient Medications Prior to Visit  Medication Sig Dispense Refill   albuterol (PROAIR HFA) 108 (90 Base) MCG/ACT inhaler 2 puffs every 4 hours as needed only  if your can't catch your breath 18 g 5   ALPRAZolam (XANAX) 1 MG tablet Take 1 tablet (1 mg total) by mouth 3 (three) times daily as needed for anxiety. for anxiety 90 tablet 2   ARIPiprazole (ABILIFY) 5 MG tablet Take 1 tablet (5 mg total) by mouth daily. 30 tablet 5   atorvastatin (LIPITOR) 20 MG tablet TAKE 1 TABLET BY MOUTH AT BEDTIME 90 tablet 1   benzonatate (TESSALON) 100 MG capsule TAKE ONE CAPSULE BY MOUTH THREE TIMES DAILY 90 capsule 1   calcipotriene (DOVONOX) 0.005 % cream Apply to affected area bid when on break from steroid cream 120 g 3   cyclobenzaprine (FLEXERIL) 10 MG tablet Take 10 mg by mouth 3 (three) times daily as needed.     dicyclomine (BENTYL) 10 MG capsule Take 1 capsule (10 mg total) by mouth every 6 (six) hours as needed for spasms. 90 capsule 3   diphenoxylate-atropine (LOMOTIL) 2.5-0.025 MG tablet Take 1 tablet by mouth 4 (four) times daily as needed for diarrhea or loose stools. 60 tablet 0   DULoxetine (CYMBALTA) 60 MG capsule TAKE TWO CAPSULES BY MOUTH EVERY DAY 180 capsule 1   FEROSUL 325 (65 Fe) MG tablet TAKE 1 TABLET BY MOUTH DAILY WITH BREAKFAST 30 tablet 3   fluticasone (FLONASE) 50 MCG/ACT nasal spray INSTILL 2 SPRAYS IN EACH NOSTRIL EVERY DAY 16 g 6   furosemide (LASIX) 20 MG tablet TAKE 1 TABLET BY MOUTH TWICE DAILY, IF SWELLING IMPROVES CAN HOLD MEDICATION 90 tablet 0   HYDROcodone-acetaminophen (NORCO) 10-325 MG tablet Take 1 tablet by mouth every 4 (four) hours as needed. To be filled 01/28/23 150 tablet 0   hydrOXYzine (ATARAX) 10 MG tablet TAKE 1 TABLET BY MOUTH THREE TIMES DAILY AS NEEDED FOR ITCHING 30 tablet 2   levothyroxine (SYNTHROID) 137 MCG tablet Take 1 tablet (137 mcg total) by mouth daily before breakfast. 90 tablet 3   loratadine  (CLARITIN) 10 MG tablet Take 10 mg by mouth daily.     meclizine (ANTIVERT) 12.5 MG tablet TAKE 1 TABLET BY MOUTH THREE TIMES DAILY AS NEEDED FOR DIZZINESS 30 tablet 1   meloxicam (MOBIC) 15 MG tablet TAKE 1 TABLET BY MOUTH EVERY DAY 30 tablet 2   OLANZapine (ZYPREXA) 10 MG tablet Take 1 tablet (10 mg total) by mouth at bedtime. 30 tablet 2   omeprazole (PRILOSEC) 40 MG capsule TAKE ONE CAPSULE BY MOUTH TWICE DAILY 180 capsule 3   ondansetron (ZOFRAN) 8 MG tablet TAKE 1 TABLET BY MOUTH TWICE DAILY 60 tablet 1   permethrin (ELIMITE) 5 % cream      potassium chloride SA (KLOR-CON M) 20  MEQ tablet TAKE 1 TABLET BY MOUTH TWICE DAILY 60 tablet 2   prochlorperazine (COMPAZINE) 10 MG tablet Take 1 tablet (10 mg total) by mouth every 6 (six) hours as needed for nausea or vomiting. 30 tablet 0   Rimegepant Sulfate (NURTEC) 75 MG TBDP Take 1 tablet (75 mg total) by mouth daily as needed (for migraine-). Take within 15 minutes of initial symptoms 8 tablet 5   topiramate (TOPAMAX) 100 MG tablet Take 1 tablet (100 mg total) by mouth at bedtime. Changed to 100 mg tabs- for ease 90 tablet 1   triamcinolone ointment (KENALOG) 0.5 % Apply 1 Application topically 3 (three) times daily. Apply thin-layer on affected areas, no more than 2 weeks consistently. 60 g 2   TRELEGY ELLIPTA 200-62.5-25 MCG/ACT AEPB INHALE 1 PUFFS into lungs DAILY AT 2pm 60 each 5   No facility-administered medications prior to visit.   Review of Systems  Constitutional:  Negative for chills, diaphoresis, fever, malaise/fatigue and weight loss.  HENT:  Negative for congestion.   Respiratory:  Positive for cough, sputum production and shortness of breath. Negative for hemoptysis and wheezing.   Cardiovascular:  Negative for chest pain, palpitations and leg swelling.    BP 132/82   Pulse 90   Ht 5\' 2"  (1.575 m)   Wt 169 lb (76.7 kg)   SpO2 96%   BMI 30.91 kg/m   Physical Exam: General: Well-appearing, no acute distress HENT: [place],  AT Eyes: EOMI, no scleral icterus Respiratory: Clear to auscultation bilaterally.  No crackles, wheezing or rales Cardiovascular: RRR, -M/R/G, no JVD Extremities:-Edema,-tenderness Neuro: AAO x4, CNII-XII grossly intact Psych: Normal mood, normal affect  Data Reviewed:  PFTs: 05/26/17 FVC 2.38 (72%) FEV1 1.6 (63%) Ratio 63 TLC 105% RV 141% DLCO 55% Interpretation: Moderate obstructive defect with significant bronchodilator response in FEV1. With presence and airtrapping and reduced gas exchange, findings are consistent with emphysema.  Labs: - Allergy profile 03/18/17 >  Eos 0.3 /  IgE  4 neg RAST  Alpha-1 December 05, 2017 normal, MM, 174  Chest imaging: HRCT chest 12/24/2017-no ILD, mild to moderate emphysema CT A/P 07/05/20- right infrahilar enlargement vs overlaying right lower lobe lung nodule. Moderate stool in colon. CT Angio Chest Aort 07/26/20 Macrolobulated RLL nodule vs enlarged right infrahilar lymph node with punctate satellite nodule, paraseptal emphysema CT Coronary 09/21/20 - Macrolobulated RLLL 2.1 x 1.5 cm lung nodule with adjacent 4mm nodule PET 10/13/20 - Hypermetabolic 2cm RLL lesion of the lung and 17mm soft tissue between descending duodenum and pancreatic head, emphysema MR Brain 11/04/20 - No metastatic disease CT CAP 01/17/21 - Resolution of RLL mass. Mild residual soft tissue of pancreaticoduodenol groove. Close follow-up recommended CT CAP 01/22/22 - No evidence of small cell lung cancer recurrence CT CAP 07/26/22 - No evidence of small cell lung cancer recurrence, sigmoid diverticulosis, mildly coarse contour of liver suggestive of cirrhosis, emphysema CT CAP 01/06/23 - No evidence of small cell lung cancer recurrence  Assessment & Plan:  68 year old female active smoker with COPD/emphysema, hx nocturnal hypoxemia and extensive small call carcinoma with no recurrence since 03/2021 who presents for follow-up for COPD. Symptomatic but controlled on triple therapy.  Recommend evaluation for aspiration.   COPD  --CONTINUE Trelegy ONE puff ONCE a day --CONTINUE Albuterol AS NEEDED shortness of breath or wheezing.   History nocturnal hypoxemia  --Previously on 3L nightly but self-discontinued due to choking from tubing --Counseled on risks of untreated nocturnal hypoxemia --Declined overnight oximetry testing  Concern for aspiration Cough/SOB with meals --RE-ORDER swallow evaluation  Tobacco abuse Patient is an active smoker. Not ready to quit. Currently 2 ppd We discussed smoking cessation for >5 minutes. We discussed triggers and stressors and ways to deal with them. We discussed barriers to continued smoking and benefits of smoking cessation. Provided patient with information cessation techniques and interventions including [place] quitline. --Discussed nicotine patches. Try decreasing every month  Small cell carcinoma of the lung, stage IVa --Per 03/2021 Oncology note, excellent response to therapy --On maintenance therapy. On cycle 36 --CT CAP 12/2022 reviewed with no currence  Health Maintenance Immunization History  Administered Date(s) Administered   Influenza,inj,Quad PF,6+ Mos 10/02/2018, 11/30/2019, 11/15/2020   Influenza-Unspecified 01/16/2011, 11/26/2011, 12/22/2012, 11/17/2018   PFIZER Comirnaty(Gray Top)Covid-19 Tri-Sucrose Vaccine 05/17/2020   PFIZER(Purple Top)SARS-COV-2 Vaccination 10/20/2019, 11/10/2019   PNEUMOCOCCAL CONJUGATE-20 09/04/2020   Td 06/07/2005   CT Lung Screen - not qualified  Orders Placed This Encounter  Procedures   SLP modified barium swallow    Standing Status:   Future    Expiration Date:   02/17/2024    Where should this test be performed::   [facility]    Please indicate reason for Referral::   Concerned about Dysphagia/Aspiration    Patients current diet consistency::   Regular    Patients current liquid consistency::   Thin (All Liquid Allowed)    Existing signs/symptoms of possible  Aspiration/Dysphagia::   Cough with Meals/Meds/Other P.O.s   Meds ordered this encounter  Medications   Fluticasone-Umeclidin-Vilant (TRELEGY ELLIPTA) 200-62.5-25 MCG/ACT AEPB    Sig: Inhale 1 puff into the lungs daily.    Dispense:  60 each    Refill:  11   Return in about 3 months (around 05/18/2023).  I have spent a total time of 32-minutes on the day of the appointment including chart review, data review, collecting history, coordinating care and discussing medical diagnosis and plan with the patient/family. Past medical history, allergies, medications were reviewed. Pertinent imaging, labs and tests included in this note have been reviewed and interpreted independently by me.  Diondre Pulis Mechele Collin, MD 02/17/2023

## 2023-02-17 NOTE — Patient Instructions (Signed)
COPD  --CONTINUE Trelegy ONE puff ONCE a day --CONTINUE Albuterol AS NEEDED shortness of breath or wheezing.   Concern for aspiration Cough/SOB with meals --RE-ORDER swallow evaluation  Tobacco abuse Patient is an active smoker. Not ready to quit. Currently 2 ppd We discussed smoking cessation for >5 minutes. We discussed triggers and stressors and ways to deal with them. We discussed barriers to continued smoking and benefits of smoking cessation. Provided patient with information cessation techniques and interventions including [employer] quitline. --Discussed nicotine patches. Try decreasing every month

## 2023-02-18 ENCOUNTER — Telehealth (HOSPITAL_COMMUNITY): Payer: Self-pay | Admitting: *Deleted

## 2023-02-18 ENCOUNTER — Encounter (HOSPITAL_COMMUNITY)
Admission: RE | Admit: 2023-02-18 | Discharge: 2023-02-18 | Disposition: A | Discharge status: Home / Self Care | Payer: 59 | Source: Ambulatory Visit | Attending: Optometry | Admitting: Optometry

## 2023-02-18 ENCOUNTER — Other Ambulatory Visit: Payer: Self-pay

## 2023-02-18 ENCOUNTER — Encounter (HOSPITAL_COMMUNITY): Payer: Self-pay

## 2023-02-18 NOTE — Telephone Encounter (Signed)
Attempted to contact patient to schedule OP MBS. Left VM. RKEEL

## 2023-02-18 NOTE — H&P (Signed)
Surgical History & Physical  Patient Name: [name]  DOB: 12/18/1955  Surgery: Cataract extraction with intraocular lens implant phacoemulsification; Right Eye Surgeon: Pecolia Ades MD Surgery Date: 02/21/2023 Pre-Op Date: 02/18/2023  HPI: A 40 Yr. old female patient present for 11 day post op OS. Patient is doing well. She is happy with results of cataract surgery. Using Combo gtt BID OS. Patient is having difficulties driving at night, reading road signs, watching TV, reading, all activities of daily living because of the cataract OD. This is negatively affecting the patient's quality of life and the patient is unable to function adequately in life with the current level of vision. Patient would like to proceed with cataract surgery.  Medical History: Macula Degeneration Cataracts Choroidal Nevus OS Anxiety, Depression, Acid Reflux, beginning stages of cirrhosis Cancer High Blood Pressure LDL Lung Problems Thyroid Problems  Review of Systems Negative Allergic/Immunologic Hx of Hypertension Cardiovascular Negative Constitutional Negative Ear, Nose, Mouth & Throat Thyroid disease Endocrine Negative Eyes Acid Reflux Gastrointestinal Negative Genitourinary Negative Hemotologic/Lymphatic Negative Integumentary Negative Musculoskeletal Negative Neurological Negative Psychiatry Lung cancer Respiratory  Social Current every day smoker of Cigarettes   Medication Systane, Prednisolone-moxiflox-bromfen,  fluconazole ,  levothyroxine ,  atorvastatin ,  meloxicam ,  topiramate ,  hydrocodone-acetaminophen ,  omeprazole ,  potassium chloride ,  triamterene-hydrochlorothiazid ,  benzonatate ,  albuterol sulfate ,  hydroxyzine HCl ,  topiramate ,  alprazolam ,  furosemide ,  meclizine ,  dicyclomine ,  duloxetine ,  FeroSul ,  aripiprazole ,  Trelegy Ellipta ,  Nurtec ODT   Sx/Procedures Phaco c IOL OS,  Cholecystectomy, Hysterectomy, Tonsillectomy, Right hip joint  replacement x3, Elbow sx  Drug Allergies  Penicillin, Sulfa, Codeine   History & Physical: Heent: cataract NECK: supple without bruits LUNGS: lungs clear to auscultation CV: regular rate and rhythm Abdomen: soft and non-tender  Impression & Plan: Assessment: 1.  CATARACT EXTRACTION STATUS; Left Eye (Z98.42) 2.  INTRAOCULAR LENS IOL ; Left Eye (Z96.1) 3.  CATARACT AGE-RELATED NUCLEAR; Both Eyes (H25.13)  Plan: 1.  POD11 Doing well. All post-op precautions discussed and instructions reviewed. Written instructions given.  2.  Clear visual axis  3.  Cataracts are visually significant and account for the patient's complaints. Discussed all risks, benefits, procedures and recovery, including infection, loss of vision and eye, need for glasses after surgery or additional procedures. Patient understands changing glasses will not improve vision. Patient indicated understanding of procedure. All questions answered. Patient desires to have surgery, recommend phacoemulsification with intraocular lens. Patient to have preliminary testing necessary ([place]/IOL Master, Mac OCT, TOPO) Educational materials provided:Cataract.  Plan: - Proceed with cataract surgery OD - Plan for best distance target with DIB00 - No DM, no fuchs, no prior eye surgery - good dilation - Stage IV Lung CA also with AMD limiting vision

## 2023-02-19 ENCOUNTER — Other Ambulatory Visit (HOSPITAL_COMMUNITY): Payer: Self-pay | Admitting: Pulmonary Disease

## 2023-02-19 ENCOUNTER — Other Ambulatory Visit: Payer: Self-pay | Admitting: Gastroenterology

## 2023-02-19 DIAGNOSIS — R1310 Dysphagia, unspecified: Secondary | ICD-10-CM

## 2023-02-19 DIAGNOSIS — R059 Cough, unspecified: Secondary | ICD-10-CM

## 2023-02-21 ENCOUNTER — Encounter (HOSPITAL_COMMUNITY): Payer: Self-pay | Admitting: Optometry

## 2023-02-21 ENCOUNTER — Ambulatory Visit (HOSPITAL_COMMUNITY): Payer: 59 | Admitting: Anesthesiology

## 2023-02-21 ENCOUNTER — Other Ambulatory Visit: Payer: Self-pay

## 2023-02-21 ENCOUNTER — Encounter (HOSPITAL_COMMUNITY)
Admission: RE | Disposition: A | Discharge status: Home / Self Care | Payer: Self-pay | Source: Home / Self Care | Attending: Optometry

## 2023-02-21 ENCOUNTER — Ambulatory Visit (HOSPITAL_COMMUNITY)
Admission: RE | Admit: 2023-02-21 | Discharge: 2023-02-21 | Disposition: A | Discharge status: Home / Self Care | Payer: 59 | Attending: Optometry | Admitting: Optometry

## 2023-02-21 DIAGNOSIS — E1136 Type 2 diabetes mellitus with diabetic cataract: Secondary | ICD-10-CM | POA: Diagnosis not present

## 2023-02-21 DIAGNOSIS — I10 Essential (primary) hypertension: Secondary | ICD-10-CM | POA: Insufficient documentation

## 2023-02-21 DIAGNOSIS — Z961 Presence of intraocular lens: Secondary | ICD-10-CM | POA: Insufficient documentation

## 2023-02-21 DIAGNOSIS — H2511 Age-related nuclear cataract, right eye: Secondary | ICD-10-CM | POA: Diagnosis not present

## 2023-02-21 DIAGNOSIS — F17210 Nicotine dependence, cigarettes, uncomplicated: Secondary | ICD-10-CM | POA: Diagnosis not present

## 2023-02-21 DIAGNOSIS — J449 Chronic obstructive pulmonary disease, unspecified: Secondary | ICD-10-CM

## 2023-02-21 DIAGNOSIS — Z9842 Cataract extraction status, left eye: Secondary | ICD-10-CM | POA: Insufficient documentation

## 2023-02-21 HISTORY — PX: CATARACT EXTRACTION W/PHACO: SHX586

## 2023-02-21 SURGERY — PHACOEMULSIFICATION, CATARACT, WITH IOL INSERTION
Anesthesia: Monitor Anesthesia Care | Site: Eye | Laterality: Right

## 2023-02-21 MED ORDER — SIGHTPATH DOSE#1 NA HYALUR & NA CHOND-NA HYALUR IO KIT
PACK | INTRAOCULAR | Status: DC | PRN
  Administered 2023-02-21: 1 via OPHTHALMIC

## 2023-02-21 MED ORDER — TETRACAINE HCL 0.5 % OP SOLN
1.0000 [drp] | OPHTHALMIC | Status: AC
  Administered 2023-02-21 (×3): 1 [drp] via OPHTHALMIC

## 2023-02-21 MED ORDER — POVIDONE-IODINE 5 % OP SOLN
OPHTHALMIC | Status: DC | PRN
  Administered 2023-02-21: 1 via OPHTHALMIC

## 2023-02-21 MED ORDER — LACTATED RINGERS IV SOLN: INTRAVENOUS | Status: DC

## 2023-02-21 MED ORDER — MOXIFLOXACIN HCL 5 MG/ML IO SOLN
INTRAOCULAR | Status: DC | PRN
  Administered 2023-02-21: .3 mL via INTRACAMERAL

## 2023-02-21 MED ORDER — BSS IO SOLN
INTRAOCULAR | Status: DC | PRN
  Administered 2023-02-21: 15 mL via INTRAOCULAR

## 2023-02-21 MED ORDER — MIDAZOLAM HCL 5 MG/5ML IJ SOLN
INTRAMUSCULAR | Status: DC | PRN
  Administered 2023-02-21: 2 mg via INTRAVENOUS

## 2023-02-21 MED ORDER — SODIUM CHLORIDE 0.9% FLUSH
INTRAVENOUS | Status: DC | PRN
  Administered 2023-02-21: 5 mL via INTRAVENOUS

## 2023-02-21 MED ORDER — MIDAZOLAM HCL 2 MG/2ML IJ SOLN
INTRAMUSCULAR | Status: AC
Start: 2023-02-21 — End: ?
  Filled 2023-02-21: qty 2

## 2023-02-21 MED ORDER — LIDOCAINE HCL 3.5 % OP GEL
1.0000 | Freq: Once | OPHTHALMIC | Status: AC
  Administered 2023-02-21: 1 via OPHTHALMIC

## 2023-02-21 MED ORDER — TROPICAMIDE 1 % OP SOLN
1.0000 [drp] | OPHTHALMIC | Status: AC
  Administered 2023-02-21 (×3): 1 [drp] via OPHTHALMIC

## 2023-02-21 MED ORDER — PHENYLEPHRINE HCL 2.5 % OP SOLN
1.0000 [drp] | OPHTHALMIC | Status: AC
  Administered 2023-02-21 (×3): 1 [drp] via OPHTHALMIC

## 2023-02-21 MED ORDER — PHENYLEPHRINE-KETOROLAC 1-0.3 % IO SOLN
INTRAOCULAR | Status: DC | PRN
  Administered 2023-02-21: 500 mL via OPHTHALMIC

## 2023-02-21 MED ORDER — STERILE WATER FOR IRRIGATION IR SOLN
Status: DC | PRN
  Administered 2023-02-21: 25 mL

## 2023-02-21 MED ORDER — LIDOCAINE HCL (PF) 1 % IJ SOLN
INTRAMUSCULAR | Status: DC | PRN
  Administered 2023-02-21: 1 mL

## 2023-02-21 SURGICAL SUPPLY — 15 items
CATARACT SUITE SIGHTPATH (MISCELLANEOUS) ×1
CLOTH BEACON ORANGE TIMEOUT ST (SAFETY) ×1 IMPLANT
DRSG TEGADERM 4X4.75 (GAUZE/BANDAGES/DRESSINGS) ×1 IMPLANT
EYE SHIELD UNIVERSAL CLEAR (GAUZE/BANDAGES/DRESSINGS) IMPLANT
FEE CATARACT SUITE SIGHTPATH (MISCELLANEOUS) ×1 IMPLANT
GLOVE BIOGEL PI IND STRL 6.5 (GLOVE) IMPLANT
GLOVE BIOGEL PI IND STRL 7.0 (GLOVE) ×2 IMPLANT
LENS IOL TECNIS EYHANCE 22.5 (Intraocular Lens) IMPLANT
NDL HYPO 18GX1.5 BLUNT FILL (NEEDLE) ×1 IMPLANT
NEEDLE HYPO 18GX1.5 BLUNT FILL (NEEDLE) ×1
PAD ARMBOARD 7.5X6 YLW CONV (MISCELLANEOUS) ×1 IMPLANT
POSITIONER HEAD 8X9X4 ADT (SOFTGOODS) ×1 IMPLANT
SYR TB 1ML LL NO SAFETY (SYRINGE) ×1 IMPLANT
TAPE SURG TRANSPORE 1 IN (GAUZE/BANDAGES/DRESSINGS) IMPLANT
WATER STERILE IRR 250ML POUR (IV SOLUTION) ×1 IMPLANT

## 2023-02-21 NOTE — Discharge Instructions (Signed)
Please discharge patient when stable, will follow up today with Dr. Ilsa Iha at the [facility] office immediately following discharge.  Leave shield in place until visit.  All paperwork with discharge instructions will be given at the office.  [facility] Address:  [address]  [place], [place] [address]  Dr. Chaya Jan Phone: [phone]

## 2023-02-21 NOTE — Anesthesia Preprocedure Evaluation (Signed)
Anesthesia Evaluation  Patient identified by MRN, date of birth, ID band Patient awake    Reviewed: Allergy & Precautions, H&P , NPO status , Patient's Chart, lab work & pertinent test results, reviewed documented beta blocker date and time   Airway Mallampati: II  TM Distance: >3 FB Neck ROM: full    Dental no notable dental hx.    Pulmonary neg pulmonary ROS, Current Smoker   Pulmonary exam normal breath sounds clear to auscultation       Cardiovascular Exercise Tolerance: Good hypertension, negative cardio ROS  Rhythm:regular Rate:Normal     Neuro/Psych negative neurological ROS  negative psych ROS   GI/Hepatic negative GI ROS, Neg liver ROS,,,  Endo/Other  negative endocrine ROSdiabetes    Renal/GU negative Renal ROS  negative genitourinary   Musculoskeletal   Abdominal   Peds  Hematology negative hematology ROS (+)   Anesthesia Other Findings   Reproductive/Obstetrics negative OB ROS                              Anesthesia Physical Anesthesia Plan  ASA: 3  Anesthesia Plan: MAC   Post-op Pain Management:    Induction:   PONV Risk Score and Plan:   Airway Management Planned:   Additional Equipment:   Intra-op Plan:   Post-operative Plan:   Informed Consent: I have reviewed the patients History and Physical, chart, labs and discussed the procedure including the risks, benefits and alternatives for the proposed anesthesia with the patient or authorized representative who has indicated his/her understanding and acceptance.     Dental Advisory Given  Plan Discussed with: CRNA  Anesthesia Plan Comments:          Anesthesia Quick Evaluation

## 2023-02-21 NOTE — Anesthesia Postprocedure Evaluation (Signed)
Anesthesia Post Note  Patient: [name]  Procedure(s) Performed: CATARACT EXTRACTION PHACO AND INTRAOCULAR LENS PLACEMENT (IOC) (Right: Eye)  Patient location during evaluation: Short Stay Anesthesia Type: MAC Level of consciousness: awake and alert Pain management: pain level controlled Vital Signs Assessment: post-procedure vital signs reviewed and stable Respiratory status: spontaneous breathing Cardiovascular status: blood pressure returned to baseline and stable Postop Assessment: no apparent nausea or vomiting Anesthetic complications: no   No notable events documented.   Last Vitals:  Vitals:   02/21/23 1222 02/21/23 1223  BP:  117/76  Pulse: 79   Resp: 17   Temp: 36.6 C   SpO2: 99%     Last Pain:  Vitals:   02/21/23 1222  TempSrc: Oral  PainSc: 0-No pain                 Stefen Juba

## 2023-02-21 NOTE — Op Note (Signed)
Date of procedure: 02/21/23  Pre-operative diagnosis: Visually significant age-related nuclear cataract, Right Eye (H25.11)  Post-operative diagnosis: Visually significant age-related nuclear cataract, Right Eye  Procedure: Removal of cataract via phacoemulsification and insertion of intra-ocular lens J&J DIBOO +22.5D into the capsular bag of the Right Eye  Attending surgeon: Pecolia Ades, MD  Anesthesia: MAC, Topical Akten  Complications: None  Estimated Blood Loss: <60mL (minimal)  Specimens: None  Implants:  Implant Name Type Inv. Item Serial No. Manufacturer Lot No. LRB No. Used Action  LENS IOL TECNIS EYHANCE 22.5 - [id number] Intraocular Lens LENS IOL TECNIS EYHANCE 22.5 [id number] [employer]  Right 1 Implanted    Indications:  Visually significant age-related cataract, Right Eye  Procedure:  The patient was seen and identified in the pre-operative area. The operative eye was identified and dilated.  The operative eye was marked.  Topical anesthesia was administered to the operative eye.     The patient was then to the operative suite and placed in the supine position.  A timeout was performed confirming the patient, procedure to be performed, and all other relevant information.   The patient's face was prepped and draped in the usual fashion for intra-ocular surgery.  A lid speculum was placed into the operative eye and the surgical microscope moved into place and focused.  A superotemporal paracentesis was created using a 20 gauge paracentesis blade.  BSS mixed with Omidria, followed by 1% lidocaine was injected into the anterior chamber.  Viscoelastic was injected into the anterior chamber.  A temporal clear-corneal main wound incision was created using a 2.62mm microkeratome.  A continuous curvilinear capsulorrhexis was initiated using an irrigating cystitome and completed using capsulorrhexis forceps.  Hydrodissection and hydrodeliniation were performed.  Viscoelastic was  injected into the anterior chamber.  A phacoemulsification handpiece and a chopper as a second instrument were used to remove the nucleus and epinucleus. The irrigation/aspiration handpiece was used to remove any remaining cortical material.   The capsular bag was reinflated with viscoelastic, checked, and found to be intact.  The intraocular lens was inserted into the capsular bag.  The irrigation/aspiration handpiece was used to remove any remaining viscoelastic.  The clear corneal wound and paracentesis wounds were then hydrated and checked with Weck-Cels to be watertight. Moxifloxacin was instilled into the anterior chamber.  The lid-speculum and drape was removed, and the patient's face was cleaned with a wet and dry 4x4. A clear shield was taped over the eye. The patient was taken to the post-operative care unit in good condition, having tolerated the procedure well.  Post-Op Instructions: The patient will follow up at [facility] for a same day post-operative evaluation and will receive all other orders and instructions.

## 2023-02-21 NOTE — Transfer of Care (Signed)
Immediate Anesthesia Transfer of Care Note  Patient: [name]  Procedure(s) Performed: CATARACT EXTRACTION PHACO AND INTRAOCULAR LENS PLACEMENT (IOC) (Right: Eye)  Patient Location: Short Stay  Anesthesia Type:MAC  Level of Consciousness: awake  Airway & Oxygen Therapy: Patient Spontanous Breathing  Post-op Assessment: Report given to RN  Post vital signs: Reviewed and stable  Last Vitals:  Vitals Value Taken Time  BP 117/76 02/21/23 1223  Temp 36.6 C 02/21/23 1222  Pulse 79 02/21/23 1222  Resp 17 02/21/23 1222  SpO2 99 % 02/21/23 1222    Last Pain:  Vitals:   02/21/23 1222  TempSrc: Oral  PainSc: 0-No pain         Complications: No notable events documented.

## 2023-02-21 NOTE — Interval H&P Note (Signed)
History and Physical Interval Note:  02/21/2023 10:53 AM  The H and P was reviewed and updated. The patient was examined.  No changes were found after exam.  The surgical eye was marked.   [name]

## 2023-02-22 ENCOUNTER — Encounter (HOSPITAL_COMMUNITY): Payer: Self-pay | Admitting: Optometry

## 2023-02-25 ENCOUNTER — Ambulatory Visit: Payer: 59

## 2023-02-25 ENCOUNTER — Ambulatory Visit: Payer: 59 | Admitting: Physician Assistant

## 2023-02-25 ENCOUNTER — Other Ambulatory Visit: Payer: 59

## 2023-02-26 ENCOUNTER — Inpatient Hospital Stay: Payer: 59

## 2023-02-26 ENCOUNTER — Inpatient Hospital Stay: Payer: 59 | Admitting: Dietician

## 2023-02-26 ENCOUNTER — Inpatient Hospital Stay (HOSPITAL_BASED_OUTPATIENT_CLINIC_OR_DEPARTMENT_OTHER): Payer: 59 | Admitting: Physician Assistant

## 2023-02-26 VITALS — BP 120/70 | HR 86 | Temp 98.0°F | Resp 18 | Ht 62.0 in | Wt 170.5 lb

## 2023-02-26 DIAGNOSIS — M48061 Spinal stenosis, lumbar region without neurogenic claudication: Secondary | ICD-10-CM | POA: Diagnosis not present

## 2023-02-26 DIAGNOSIS — F17210 Nicotine dependence, cigarettes, uncomplicated: Secondary | ICD-10-CM | POA: Diagnosis not present

## 2023-02-26 DIAGNOSIS — Z5986 Financial insecurity: Secondary | ICD-10-CM | POA: Diagnosis not present

## 2023-02-26 DIAGNOSIS — I700 Atherosclerosis of aorta: Secondary | ICD-10-CM | POA: Diagnosis not present

## 2023-02-26 DIAGNOSIS — E785 Hyperlipidemia, unspecified: Secondary | ICD-10-CM | POA: Diagnosis not present

## 2023-02-26 DIAGNOSIS — C3431 Malignant neoplasm of lower lobe, right bronchus or lung: Secondary | ICD-10-CM | POA: Diagnosis not present

## 2023-02-26 DIAGNOSIS — R600 Localized edema: Secondary | ICD-10-CM | POA: Diagnosis not present

## 2023-02-26 DIAGNOSIS — G8929 Other chronic pain: Secondary | ICD-10-CM | POA: Diagnosis not present

## 2023-02-26 DIAGNOSIS — R0781 Pleurodynia: Secondary | ICD-10-CM | POA: Diagnosis not present

## 2023-02-26 DIAGNOSIS — E039 Hypothyroidism, unspecified: Secondary | ICD-10-CM | POA: Diagnosis not present

## 2023-02-26 DIAGNOSIS — E876 Hypokalemia: Secondary | ICD-10-CM | POA: Diagnosis not present

## 2023-02-26 DIAGNOSIS — K7460 Unspecified cirrhosis of liver: Secondary | ICD-10-CM | POA: Diagnosis not present

## 2023-02-26 DIAGNOSIS — M51369 Other intervertebral disc degeneration, lumbar region without mention of lumbar back pain or lower extremity pain: Secondary | ICD-10-CM | POA: Diagnosis not present

## 2023-02-26 DIAGNOSIS — R197 Diarrhea, unspecified: Secondary | ICD-10-CM | POA: Diagnosis not present

## 2023-02-26 DIAGNOSIS — M47816 Spondylosis without myelopathy or radiculopathy, lumbar region: Secondary | ICD-10-CM | POA: Diagnosis not present

## 2023-02-26 DIAGNOSIS — K219 Gastro-esophageal reflux disease without esophagitis: Secondary | ICD-10-CM | POA: Diagnosis not present

## 2023-02-26 DIAGNOSIS — R42 Dizziness and giddiness: Secondary | ICD-10-CM | POA: Diagnosis not present

## 2023-02-26 DIAGNOSIS — J439 Emphysema, unspecified: Secondary | ICD-10-CM | POA: Diagnosis not present

## 2023-02-26 DIAGNOSIS — Z5112 Encounter for antineoplastic immunotherapy: Secondary | ICD-10-CM | POA: Diagnosis not present

## 2023-02-26 DIAGNOSIS — Z95828 Presence of other vascular implants and grafts: Secondary | ICD-10-CM

## 2023-02-26 DIAGNOSIS — I10 Essential (primary) hypertension: Secondary | ICD-10-CM | POA: Diagnosis not present

## 2023-02-26 DIAGNOSIS — R110 Nausea: Secondary | ICD-10-CM | POA: Diagnosis not present

## 2023-02-26 DIAGNOSIS — E1140 Type 2 diabetes mellitus with diabetic neuropathy, unspecified: Secondary | ICD-10-CM | POA: Diagnosis not present

## 2023-02-26 DIAGNOSIS — R200 Anesthesia of skin: Secondary | ICD-10-CM | POA: Diagnosis not present

## 2023-02-26 LAB — CBC WITH DIFFERENTIAL (CANCER CENTER ONLY)
Abs Immature Granulocytes: 0.02 10*3/uL (ref 0.00–0.07)
Basophils Absolute: 0 10*3/uL (ref 0.0–0.1)
Basophils Relative: 0 %
Eosinophils Absolute: 0.4 10*3/uL (ref 0.0–0.5)
Eosinophils Relative: 6 %
HCT: 41 % (ref 36.0–46.0)
Hemoglobin: 13.5 g/dL (ref 12.0–15.0)
Immature Granulocytes: 0 %
Lymphocytes Relative: 33 %
Lymphs Abs: 2.2 10*3/uL (ref 0.7–4.0)
MCH: 31.4 pg (ref 26.0–34.0)
MCHC: 32.9 g/dL (ref 30.0–36.0)
MCV: 95.3 fL (ref 80.0–100.0)
Monocytes Absolute: 0.4 10*3/uL (ref 0.1–1.0)
Monocytes Relative: 6 %
Neutro Abs: 3.6 10*3/uL (ref 1.7–7.7)
Neutrophils Relative %: 55 %
Platelet Count: 166 10*3/uL (ref 150–400)
RBC: 4.3 MIL/uL (ref 3.87–5.11)
RDW: 15.9 % — ABNORMAL HIGH (ref 11.5–15.5)
WBC Count: 6.7 10*3/uL (ref 4.0–10.5)
nRBC: 0 % (ref 0.0–0.2)

## 2023-02-26 LAB — CMP (CANCER CENTER ONLY)
ALT: 8 U/L (ref 0–44)
AST: 14 U/L — ABNORMAL LOW (ref 15–41)
Albumin: 3.5 g/dL (ref 3.5–5.0)
Alkaline Phosphatase: 65 U/L (ref 38–126)
Anion gap: 7 (ref 5–15)
BUN: 16 mg/dL (ref 8–23)
CO2: 26 mmol/L (ref 22–32)
Calcium: 8.5 mg/dL — ABNORMAL LOW (ref 8.9–10.3)
Chloride: 104 mmol/L (ref 98–111)
Creatinine: 1.13 mg/dL — ABNORMAL HIGH (ref 0.44–1.00)
GFR, Estimated: 53 mL/min — ABNORMAL LOW (ref >60)
Glucose, Bld: 94 mg/dL (ref 70–99)
Potassium: 4.2 mmol/L (ref 3.5–5.1)
Sodium: 137 mmol/L (ref 135–145)
Total Bilirubin: 0.4 mg/dL (ref 0.0–1.2)
Total Protein: 6 g/dL — ABNORMAL LOW (ref 6.5–8.1)

## 2023-02-26 LAB — TSH: TSH: 1.157 u[IU]/mL (ref 0.350–4.500)

## 2023-02-26 MED ORDER — SODIUM CHLORIDE 0.9 % IV SOLN
1200.0000 mg | Freq: Once | INTRAVENOUS | Status: AC
  Administered 2023-02-26: 1200 mg via INTRAVENOUS
  Filled 2023-02-26: qty 20

## 2023-02-26 MED ORDER — SODIUM CHLORIDE 0.9% FLUSH
10.0000 mL | Freq: Once | INTRAVENOUS | Status: AC
  Administered 2023-02-26: 10 mL

## 2023-02-26 MED ORDER — SODIUM CHLORIDE 0.9 % IV SOLN: Freq: Once | INTRAVENOUS | Status: AC

## 2023-02-26 MED ORDER — ONDANSETRON HCL 4 MG/2ML IJ SOLN
8.0000 mg | Freq: Once | INTRAMUSCULAR | Status: AC
Start: 2023-02-26 — End: 2023-02-26
  Administered 2023-02-26: 8 mg via INTRAVENOUS
  Filled 2023-02-26: qty 4

## 2023-02-26 MED ORDER — HEPARIN SOD (PORK) LOCK FLUSH 100 UNIT/ML IV SOLN
500.0000 [IU] | Freq: Once | INTRAVENOUS | Status: AC | PRN
  Administered 2023-02-26: 500 [IU]

## 2023-02-26 MED ORDER — SODIUM CHLORIDE 0.9% FLUSH
10.0000 mL | INTRAVENOUS | Status: DC | PRN
  Administered 2023-02-26: 10 mL

## 2023-02-26 NOTE — Progress Notes (Signed)
Nutrition Follow-up:  Patient with extensive stage small cell lung cancer. She is receiving maintenance therapy with atezolizumab q21d.    Noted recent 12/9 imaging showing no recurrence  Met with patient in infusion. She is snacking on trail mix at visit. Patient endorses improving appetite. She gets hungry at breakfast and dinner. Patient typically has egg and cheese sandwich for breakfast. She had 2 open faced hard boiled egg sandwich and green beans for dinner. Patient denies nausea, vomiting, diarrhea. Constipation managed with daily stool softener.   Medications: reviewed  Labs: reviewed  Anthropometrics: Wt 170 lb 8 oz decreased 3% in 4 weeks - insignificant for time frame, however concerning  12/27 - 176 lb 3.2 oz 12/18 - 178 lb 1 oz   NUTRITION DIAGNOSIS: Inadequate oral intake - ongoing    INTERVENTION:  Continue paying attention to hunger cues, encouraged high calorie high protein snacks in between meals Strive for wt maintenance     MONITORING, EVALUATION, GOAL: wt trends, intake   NEXT VISIT: Wednesday February 19 during infusion

## 2023-02-26 NOTE — Progress Notes (Unsigned)
[name] [phone]   [phone]  PROGRESS NOTE  Patient Care Team: Allwardt, Crist Infante, PA-C as PCP - General (Physician Assistant) O'Neal, Ronnald Ramp, MD as PCP - Cardiology (Cardiology) Rachael Fee, MD (Inactive) as Attending Physician (Gastroenterology) Dr. Louie Bun, MD as Consulting Physician (Orthopedic Surgery) Ranee Gosselin, MD as Consulting Physician (Orthopedic Surgery) York Spaniel, MD (Inactive) as Consulting Physician (Neurology) Smitty Cords, OD as Consulting Physician (Optometry) Karel Jarvis Lesle Chris, MD as Consulting Physician (Neurology) Erroll Luna, [facility] (Inactive) (Pharmacist)  Hematological/Oncological History # Small Cell Lung Cancer, Extensive Stage 07/05/2020: CT abdomen for lower abdominal pain. New pulmonary nodular density noted 07/06/2020: CT chest showed 2.2 cm macrolobulated right lower lobe pulmonary nodule (favored) versus pathologically enlarged infrahilar lymph node 10/13/2020: PET CT scan performed, findings show 2 cm right lower lobe lung mass is hypermetabolic and consistent with primary lung neoplasm. Additionally found hypermetabolic 17 mm soft tissue lesion between the descending duodenum and the pancreatic head  10/19/2020: EGD to biopsy hypermetabolic lymph node. Biopsy results show small cell lung cancer 10/26/2020: establish care with Dr. Leonides Schanz  11/13/2020: Cycle 1 Day 1 of Carbo/Etop/Atezolizumab 12/04/2020: Cycle 2 Day 1 of Carbo/Etop/Atezolizumab 11/22-11/25/2022: admitted for E. Coli bacteremia/sepsis. Start of Cycle 3 delayed. 01/02/2021: Cycle 3 Day 1 of Carbo/Etop/Atezolizumab 01/23/2021: Cycle 4 Day 1 of Carbo/Etop/Atezolizumab 02/22/2021: Cycle 5 Day 1 of Atezolizumab Maintenance. Delayed start due to patient's COVID infection.  03/14/2021: Cycle 6 Day 1 of Atezolizumab Maintenance 04/05/2021: Cycle 7 Day 1 of Atezolizumab Maintenance 04/25/2021: Cycle 8 Day 1 of  Atezolizumab Maintenance 05/16/2021: Cycle 9 Day 1 of Atezolizumab Maintenance 06/06/2021: Cycle 10 Day 1 of Atezolizumab Maintenance 06/27/2021:  Cycle 11 Day 1 of Atezolizumab Maintenance 07/18/2021: Cycle 12 Day 1 of Atezolizumab Maintenance 08/08/2021: Cycle 13 Day 1 of Atezolizumab Maintenance 08/29/2021: Cycle 14 Day 1 of Atezolizumab Maintenance 09/19/2021: Cycle 15 Day 1 of Atezolizumab Maintenance 10/10/2021: Cycle 16 Day 1 of Atezolizumab Maintenance 10/31/2021: Cycle 17 Day 1 of Atezolizumab Maintenance 11/21/2021: Cycle 18 Day 1 of Atezolizumab Maintenance 12/12/2021: Cycle 19 Day 1 of Atezolizumab Maintenance 01/02/2022: Cycle 20 Day 1 of Atezolizumab Maintenance 01/23/2022: Cycle 21 Day 1 of Atezolizumab Maintenance 02/13/2022: treatment HELD due to diarrhea.  03/06/2022: Cycle 22 Day 1 of Atezolizumab Maintenance 03/27/2022: Cycle 23 Day 1 of Atezolizumab Maintenance 04/17/2022: Cycle 24 Day 1 of Atezolizumab Maintenance 05/08/2022:  Cycle 25 Day 1 of Atezolizumab Maintenance (Held due to patient preference for treatment holiday) 05/29/2022: Cycle 25 Day 1 of Atezolizumab Maintenance  06/19/2022: Cycle 26 Day 1 of Atezolizumab Maintenance  07/12/2022: Cycle 27 Day 1 of Atezolizumab Maintenance 07/31/2022: Cycle 28 Day 1 of Atezolizumab Maintenance HELD per patient request for fatigue.  08/22/2022: Cycle 28 Day 1 of Atezolizumab Maintenance 09/11/2022: Cycle 29 Day 1 of Atezolizumab Maintenance 10/03/2022: Cycle 30 Day 1 of Atezolizumab Maintenance 10/23/2022: Cycle 31 Day 1 of Atezolizumab Maintenance 11/13/2022: Cycle 32 Day 1 of Atezolizumab Maintenance 12/24/2022: Cycle 33 Day 1 of Atezolizumab Maintenance 01/15/2023: Cycle 34 Day 1 of Atezolizumab Maintenance 02/05/2023: Cycle 35 Day 1 of Atezolizumab Maintenance 02/26/2023: Cycle 36 Day 1 of Atezolizumab Maintenance  Interval History:  [name] 68 y.o. female with medical history significant for extensive stage small cell lung  cancer who presents for a follow up visit. The patient's last visit was on 02/05/2023.  She presents today to start cycle 36 of chemotherapy.   On exam today [name] reports she is unaccompanied for this  visit. She recently underwent cataract surgery on 02/21/2023 and reports her vision has greatly improved. She reports her energy is overall stable and she is able to complete her ADLs at baseline. She denies any appetite changes but has lost approximately 5-6 lbs since the last visit. She reports having nausea mainly in the morning that is relieved with eating breakfast. She denies any bowel habit changes. She does notice occasional hemorrhoidal bleeding. She reports new onset bilateral lower rib pain. She describes the pain as intermittent and sharp, rating 5 out of 10 on a pain scale. Her breathing is unchanged without any new cough.  She reports that otherwise she remains at her baseline level of health and is willing and able to proceed with atezolizumab therapy at this time.  She denies fevers, chills, sweats, chest pain or cough. She has no other complaints.A full 10 point ROS is listed below.   MEDICAL HISTORY:  Past Medical History:  Diagnosis Date   Allergic rhinitis    Anemia    Anxiety    Chicken pox    Chronic back pain    COPD (chronic obstructive pulmonary disease) (HCC)    Depression    DM (diabetes mellitus) (HCC)    Essential hypertension    GERD (gastroesophageal reflux disease)    Headache    migraines   History of gastritis    EGD 2015   History of home oxygen therapy    2 liters at hs last 6 months   Hyperlipidemia    Hypothyroidism    Migraines    Osteoarthritis    oa   Scoliosis    Small cell lung cancer (HCC)    Stage 4    SURGICAL HISTORY: Past Surgical History:  Procedure Laterality Date   APPENDECTOMY     1985   BIOPSY  07/24/2017   Procedure: BIOPSY;  Surgeon: Rachael Fee, MD;  Location: [facility];  Service: Endoscopy;;   CARDIAC  CATHETERIZATION N/A 10/31/2015   Procedure: Left Heart Cath and Coronary Angiography;  Surgeon: Marykay Lex, MD;  Location: [name] INVASIVE CV LAB;  Service: Cardiovascular;  Laterality: N/A;   CARPAL TUNNEL RELEASE Left    CARPAL TUNNEL RELEASE Right    CATARACT EXTRACTION W/PHACO Left 02/07/2023   Procedure: CATARACT EXTRACTION PHACO AND INTRAOCULAR LENS PLACEMENT (IOC);  Surgeon: Pecolia Ades, MD;  Location: [facility];  Service: Ophthalmology;  Laterality: Left;  CDE: 3.51   CATARACT EXTRACTION W/PHACO Right 02/21/2023   Procedure: CATARACT EXTRACTION PHACO AND INTRAOCULAR LENS PLACEMENT (IOC);  Surgeon: Pecolia Ades, MD;  Location: [facility];  Service: Ophthalmology;  Laterality: Right;  CDE: 2.94   CHOLECYSTECTOMY  late 1980's   COLONOSCOPY WITH PROPOFOL N/A 07/24/2017   Procedure: COLONOSCOPY WITH PROPOFOL;  Surgeon: Rachael Fee, MD;  Location: [facility];  Service: Endoscopy;  Laterality: N/A;   ESOPHAGOGASTRODUODENOSCOPY N/A 07/24/2017   Procedure: ESOPHAGOGASTRODUODENOSCOPY (EGD);  Surgeon: Rachael Fee, MD;  Location: [facility];  Service: Endoscopy;  Laterality: N/A;   ESOPHAGOGASTRODUODENOSCOPY (EGD) WITH PROPOFOL N/A 10/19/2020   Procedure: ESOPHAGOGASTRODUODENOSCOPY (EGD) WITH PROPOFOL;  Surgeon: Rachael Fee, MD;  Location: [facility];  Service: Endoscopy;  Laterality: N/A;   FINE NEEDLE ASPIRATION N/A 10/19/2020   Procedure: FINE NEEDLE ASPIRATION (FNA) LINEAR;  Surgeon: Rachael Fee, MD;  Location: [facility];  Service: Endoscopy;  Laterality: N/A;   GALLBLADDER SURGERY  1991   HIP CLOSED REDUCTION Right 01/08/2016   Procedure: CLOSED MANIPULATION HIP;  Surgeon: Jene Every, MD;  Location: [facility];  Service: Orthopedics;  Laterality: Right;   HIP CLOSED REDUCTION Right 01/19/2016   Procedure: ATTEMPTED CLOSED REDUCTION RIGHT HIP;  Surgeon: Toni Arthurs, MD;  Location: [facility];  Service: Orthopedics;  Laterality: Right;   HIP CLOSED REDUCTION Right  01/20/2016   Procedure: CLOSED REDUCTION RIGHT TOTAL HIP;  Surgeon: Durene Romans, MD;  Location: [facility];  Service: Orthopedics;  Laterality: Right;   HIP CLOSED REDUCTION Right 02/17/2016   Procedure: CLOSED REDUCTION RIGHT TOTAL HIP;  Surgeon: Samson Frederic, MD;  Location: [facility];  Service: Orthopedics;  Laterality: Right;   HIP CLOSED REDUCTION Right 02/28/2016   Procedure: CLOSED REDUCTION HIP;  Surgeon: Yolonda Kida, MD;  Location: [facility];  Service: Orthopedics;  Laterality: Right;   IR IMAGING GUIDED PORT INSERTION  11/01/2020   POLYPECTOMY  07/24/2017   Procedure: POLYPECTOMY;  Surgeon: Rachael Fee, MD;  Location: [facility];  Service: Endoscopy;;   TONSILLECTOMY     TOTAL ABDOMINAL HYSTERECTOMY     1985, with 1 ovary removed and 2 nd ovary removed 2003   TOTAL HIP ARTHROPLASTY Right    Original surgery 2006 with revision 2010   TOTAL HIP REVISION Right 01/01/2016   Procedure: TOTAL HIP REVISION;  Surgeon: Durene Romans, MD;  Location: [facility];  Service: Orthopedics;  Laterality: Right;   TOTAL HIP REVISION Right 03/02/2016   Procedure: TOTAL HIP REVISION;  Surgeon: Durene Romans, MD;  Location: [facility];  Service: Orthopedics;  Laterality: Right;   TOTAL HIP REVISION Right 09/02/2016   Procedure: Right hip constrained liner- posterior;  Surgeon: Durene Romans, MD;  Location: [facility];  Service: Orthopedics;  Laterality: Right;   ULNAR NERVE TRANSPOSITION Right    UPPER ESOPHAGEAL ENDOSCOPIC ULTRASOUND (EUS) N/A 10/19/2020   Procedure: UPPER ESOPHAGEAL ENDOSCOPIC ULTRASOUND (EUS);  Surgeon: Rachael Fee, MD;  Location: [facility];  Service: Endoscopy;  Laterality: N/A;  periduodenal lesion    SOCIAL HISTORY: Social History   Socioeconomic History   Marital status: Married    Spouse name: Not on file   Number of children: 2   Years of education: Not on file   Highest education level: Some college, no degree  Occupational History   Occupation: disabled   Occupation:  disabled  Tobacco Use   Smoking status: Every Day    Current packs/day: 2.00    Average packs/day: 2.0 packs/day for 46.0 years (92.0 ttl pk-yrs)    Types: Cigarettes   Smokeless tobacco: Never   Tobacco comments:    2 packs of cigarettes smoked daily 12/13/21- declines smoking cessation  Vaping Use   Vaping status: Never Used  Substance and Sexual Activity   Alcohol use: No   Drug use: No   Sexual activity: Not Currently    Partners: Male  Other Topics Concern   Not on file  Social History Narrative   Right handed    Caffeine~ 2 cups per day    Lives at home with husband (strained relationship)   Primary caretaker for disabled brother who had aneurism   Daughter died 08-Jun-2018   Social Drivers of Corporate investment banker Strain: High Risk (01/02/2023)   Overall Financial Resource Strain (CARDIA)    Difficulty of Paying Living Expenses: Very hard  Food Insecurity: No Food Insecurity (01/02/2023)   Hunger Vital Sign    Worried About Running Out of Food in the Last Year: Never true    Ran Out of Food in the Last Year: Never  true  Transportation Needs: No Transportation Needs (01/02/2023)   PRAPARE - Administrator, Civil Service (Medical): No    Lack of Transportation (Non-Medical): No  Physical Activity: Inactive (01/02/2023)   Exercise Vital Sign    Days of Exercise per Week: 0 days    Minutes of Exercise per Session: 0 min  Stress: Stress Concern Present (01/02/2023)   [employer] of Occupational Health - Occupational Stress Questionnaire    Feeling of Stress : Very much  Social Connections: Unknown (01/02/2023)   Social Connection and Isolation Panel [NHANES]    Frequency of Communication with Friends and Family: More than three times a week    Frequency of Social Gatherings with Friends and Family: More than three times a week    Attends Religious Services: 1 to 4 times per year    Active Member of [employer] or Organizations: No    Attends Tax inspector Meetings: Never    Marital Status: Patient declined  Catering manager Violence: Not At Risk (05/16/2022)   Humiliation, Afraid, Rape, and Kick questionnaire    Fear of Current or Ex-Partner: No    Emotionally Abused: No    Physically Abused: No    Sexually Abused: No    FAMILY HISTORY: Family History  Problem Relation Age of Onset   COPD Mother    Heart disease Mother    Lung disease Father        Asbestosis   Heart attack Father    Heart disease Father    Cerebral aneurysm Brother    Aneurysm Brother        Brain   Drug abuse Daughter    Epilepsy Son    Alcohol abuse Son    Drug abuse Son    Arthritis Maternal Grandmother    Heart disease Maternal Grandmother    Asthma Maternal Grandfather    Cancer Maternal Grandfather    Arthritis Paternal Grandmother    Heart disease Paternal Grandmother    Stroke Paternal Grandmother    Early death Paternal Grandfather    Heart disease Paternal Grandfather     ALLERGIES:  is allergic to metformin and related, nsaids, wellbutrin [bupropion], aleve [naproxen sodium], codeine, penicillins, and sulfonamide derivatives.  MEDICATIONS:  Current Outpatient Medications  Medication Sig Dispense Refill   albuterol (PROAIR HFA) 108 (90 Base) MCG/ACT inhaler 2 puffs every 4 hours as needed only  if your can't catch your breath 18 g 5   ALPRAZolam (XANAX) 1 MG tablet Take 1 tablet (1 mg total) by mouth 3 (three) times daily as needed for anxiety. for anxiety 90 tablet 2   ARIPiprazole (ABILIFY) 5 MG tablet Take 1 tablet (5 mg total) by mouth daily. 30 tablet 5   atorvastatin (LIPITOR) 20 MG tablet TAKE 1 TABLET BY MOUTH AT BEDTIME 90 tablet 1   benzonatate (TESSALON) 100 MG capsule TAKE ONE CAPSULE BY MOUTH THREE TIMES DAILY 90 capsule 1   calcipotriene (DOVONOX) 0.005 % cream Apply to affected area bid when on break from steroid cream 120 g 3   cyclobenzaprine (FLEXERIL) 10 MG tablet Take 10 mg by mouth 3 (three) times daily as  needed.     dicyclomine (BENTYL) 10 MG capsule TAKE ONE CAPSULE BY MOUTH EVERY 6 HOURS AS NEEDED FOR SPASMS 60 capsule 1   diphenoxylate-atropine (LOMOTIL) 2.5-0.025 MG tablet Take 1 tablet by mouth 4 (four) times daily as needed for diarrhea or loose stools. 60 tablet 0   DULoxetine (CYMBALTA) 60 MG  capsule TAKE TWO CAPSULES BY MOUTH EVERY DAY 180 capsule 1   FEROSUL 325 (65 Fe) MG tablet TAKE 1 TABLET BY MOUTH DAILY WITH BREAKFAST 30 tablet 3   fluticasone (FLONASE) 50 MCG/ACT nasal spray INSTILL 2 SPRAYS IN EACH NOSTRIL EVERY DAY 16 g 6   Fluticasone-Umeclidin-Vilant (TRELEGY ELLIPTA) 200-62.5-25 MCG/ACT AEPB Inhale 1 puff into the lungs daily. 60 each 11   furosemide (LASIX) 20 MG tablet TAKE 1 TABLET BY MOUTH TWICE DAILY, IF SWELLING IMPROVES CAN HOLD MEDICATION 90 tablet 0   HYDROcodone-acetaminophen (NORCO) 10-325 MG tablet Take 1 tablet by mouth every 4 (four) hours as needed. To be filled 01/28/23 150 tablet 0   hydrOXYzine (ATARAX) 10 MG tablet TAKE 1 TABLET BY MOUTH THREE TIMES DAILY AS NEEDED FOR ITCHING 30 tablet 2   levothyroxine (SYNTHROID) 137 MCG tablet Take 1 tablet (137 mcg total) by mouth daily before breakfast. 90 tablet 3   loratadine (CLARITIN) 10 MG tablet Take 10 mg by mouth daily.     meclizine (ANTIVERT) 12.5 MG tablet TAKE 1 TABLET BY MOUTH THREE TIMES DAILY AS NEEDED FOR DIZZINESS 30 tablet 1   meloxicam (MOBIC) 15 MG tablet TAKE 1 TABLET BY MOUTH EVERY DAY 30 tablet 2   OLANZapine (ZYPREXA) 10 MG tablet Take 1 tablet (10 mg total) by mouth at bedtime. 30 tablet 2   omeprazole (PRILOSEC) 40 MG capsule TAKE ONE CAPSULE BY MOUTH TWICE DAILY 180 capsule 3   ondansetron (ZOFRAN) 8 MG tablet TAKE 1 TABLET BY MOUTH TWICE DAILY 60 tablet 1   permethrin (ELIMITE) 5 % cream      potassium chloride SA (KLOR-CON M) 20 MEQ tablet TAKE 1 TABLET BY MOUTH TWICE DAILY 60 tablet 2   prochlorperazine (COMPAZINE) 10 MG tablet Take 1 tablet (10 mg total) by mouth every 6 (six) hours as  needed for nausea or vomiting. 30 tablet 0   Rimegepant Sulfate (NURTEC) 75 MG TBDP Take 1 tablet (75 mg total) by mouth daily as needed (for migraine-). Take within 15 minutes of initial symptoms 8 tablet 5   topiramate (TOPAMAX) 100 MG tablet Take 1 tablet (100 mg total) by mouth at bedtime. Changed to 100 mg tabs- for ease 90 tablet 1   triamcinolone ointment (KENALOG) 0.5 % Apply 1 Application topically 3 (three) times daily. Apply thin-layer on affected areas, no more than 2 weeks consistently. 60 g 2   No current facility-administered medications for this visit.   Facility-Administered Medications Ordered in Other Visits  Medication Dose Route Frequency Provider Last Rate Last Admin   sodium chloride flush (NS) 0.9 % injection 10 mL  10 mL Intracatheter PRN Jaci Standard, MD   10 mL at 02/26/23 1334    REVIEW OF SYSTEMS:   Constitutional: ( - ) fevers, ( - )  chills , ( - ) night sweats Eyes: ( - ) blurriness of vision, ( - ) double vision, ( - ) watery eyes Ears, nose, mouth, throat, and face: ( - ) mucositis, ( - ) sore throat Respiratory: ( - ) cough, ( -) dyspnea, ( - ) wheezes Cardiovascular: ( - ) palpitation, ( - ) chest discomfort, ( - ) lower extremity swelling Gastrointestinal:  ( +) nausea, ( - ) heartburn, ( - ) change in bowel habits Skin: ( - ) abnormal skin rashes Lymphatics: ( - ) new lymphadenopathy, ( - ) easy bruising Neurological: (+ ) numbness, ( - ) tingling, ( - ) new weaknesses Behavioral/Psych: ( - )  mood change, ( - ) new changes  All other systems were reviewed with the patient and are negative.  PHYSICAL EXAMINATION: ECOG PERFORMANCE STATUS: 1 - Symptomatic but completely ambulatory  Vitals:   02/26/23 1117  BP: 120/70  Pulse: 86  Resp: 18  Temp: 98 F (36.7 C)  SpO2: 98%   Filed Weights   02/26/23 1117  Weight: 170 lb 8 oz (77.3 kg)    GENERAL: Well-appearing middle-age Caucasian female, alert, no distress and comfortable SKIN: skin  color, texture, turgor are normal, no rashes or significant lesions EYES: conjunctiva are pink and non-injected, sclera clear LUNGS:  normal breathing effort. Diffuse wheezing hear on ausculation.  HEART: regular rate & rhythm and no murmurs. Mild bilateral lower extremity edema Musculoskeletal: no cyanosis of digits and no clubbing  PSYCH: alert & oriented x 3, fluent speech NEURO: no focal motor/sensory deficits  LABORATORY DATA:  I have reviewed the data as listed    Latest Ref Rng & Units 02/26/2023   10:39 AM 02/05/2023    8:31 AM 01/15/2023   10:13 AM  CBC  WBC 4.0 - 10.5 K/uL 6.7  8.1  10.0   Hemoglobin 12.0 - 15.0 g/dL 40.9  81.1  91.4   Hematocrit 36.0 - 46.0 % 41.0  42.2  37.2   Platelets 150 - 400 K/uL 166  199  211        Latest Ref Rng & Units 02/26/2023   10:39 AM 02/05/2023    8:31 AM 01/15/2023   10:13 AM  CMP  Glucose 70 - 99 mg/dL 94  782  956   BUN 8 - 23 mg/dL 16  14  12    Creatinine 0.44 - 1.00 mg/dL 2.13  0.86  5.78   Sodium 135 - 145 mmol/L 137  138  130   Potassium 3.5 - 5.1 mmol/L 4.2  3.3  3.0   Chloride 98 - 111 mmol/L 104  104  95   CO2 22 - 32 mmol/L 26  26  27    Calcium 8.9 - 10.3 mg/dL 8.5  8.4  8.4   Total Protein 6.5 - 8.1 g/dL 6.0  6.0  5.8   Total Bilirubin 0.0 - 1.2 mg/dL 0.4  0.3  0.5   Alkaline Phos 38 - 126 U/L 65  78  84   AST 15 - 41 U/L 14  13  12    ALT 0 - 44 U/L 8  8  9      No results found for: "MPROTEIN" Lab Results  Component Value Date   KPAFRELGTCHN 0.75 06/30/2014   LAMBDASER 3.78 (H) 06/30/2014   KAPLAMBRATIO 0.20 (L) 06/30/2014     RADIOGRAPHIC STUDIES: No results found.   ASSESSMENT & PLAN [name] 68 y.o. female with medical history significant for extensive stage small cell lung cancer who presents for a follow up visit.   After review of the labs, review of the records, and discussion with the patient the patients findings are most consistent with extensive stage small cell lung cancer with metastasis  from the right lower lobe to the lymph nodes of the abdomen.  At this time we will pursue triple therapy with carboplatin, etoposide, and atezolizumab.  After 4 cycles we will convert to maintenance atezolizumab alone.  We previously discussed the risks and benefits of this therapy and the patient was in agreement to proceed with this treatment.  The treatment of choice consist of carboplatin, etoposide, and atezolizumab.  The regimen consists of  carboplatin AUC of 5 IV on day 1, etoposide 100 mg per metered squared IV on day 1, 2, and 3 and atezolizumab 1200 mg on day 1.  This continues for 21-day cycles.  After 4 cycles the patient proceeds with atezolizumab maintenance therapy alone.    # Small Cell Lung Cancer, Extensive Stage -- MRI of the brain shows no evidence of intracranial spread --Findings are currently consistent with metastatic small cell lung cancer with metastatic spread to the lymph nodes of the abdomen -- Started carboplatin, etoposide, and atezolizumab on 11/13/2020. Transitioned to maintenance Atezolizumab on 02/22/2021.  Plan: --today is Cycle 36 Day 1 of Atezolizumab maintenance.  --labs today were reviewed and adequate for treatment. Hgb 13.5, white blood cell 6.7, MCV 95.3, platelets 166. Creatinine stable at 1.13. LFTs in range. Thyroid panel normal. -- Most recent CT CAP from 01/06/2023 which shows no evidence of recurrence. Repeat scan due in March 2025.   --Proceed with treatment without any dose modifications.  --RTC in 3 weeks for a follow up before Cycle 37  #B/L anterior lower rib pain: --Etiology unknown, non tender to palpation. --Will order chest xray for further evaluation.   # Early Cirrhosis --noted on MR abdomen from 05/22/2022. [place] with elastography performed on 06/18/2022. --continue to follow with GI.   #Right renal lesion: --Noted to be benign on most recent MRI of the abdomen.  #Diarrhea-improved --stool sent for C. Diff and GI pathology panel, all  negative --patient following with GI and found to have pancreatic insufficiency and started on creon with improvement of symptoms.   #Nausea: --Symptoms improved with zofran and olanzapine.  --Added IV zofran to infusion premeds for better symptom control.   #Hypokalemia:  --currently taking potassium chloride 20 meq BID.   --potassium level is still 4.2 today. Continue PO supplementation.   #Lower extremity edema --Currently takes lasix 20 mg 1-2 times a day. --Stable. Continue to monitor.   #Neuropathy involving fingers/feet: --Patient currently takes Cymbalta   #Supportive Care -- chemotherapy education complete -- port placed -- zofran 8mg  q8H PRN and compazine 10mg  PO q6H for nausea -- EMLA cream for port  Orders Placed This Encounter  Procedures   DG Chest 2 View    Standing Status:   Future    Expected Date:   02/27/2023    Expiration Date:   02/26/2024    Reason for Exam (SYMPTOM  OR DIAGNOSIS REQUIRED):   bilateral anterior lower rib pain    Preferred imaging location?:   [facility]   All questions were answered. The patient knows to call the clinic with any problems, questions or concerns.  I have spent a total of 30 minutes minutes of face-to-face and non-face-to-face time, preparing to see the patient, performing a medically appropriate examination, counseling and educating the patient, ordering medications/tests, documenting clinical information in the electronic health record, and care coordination.   Georga Kaufmann PA-C Dept of Hematology and Oncology [name] Phone: [phone]   02/26/2023 3:22 PM

## 2023-02-27 ENCOUNTER — Encounter: Payer: Self-pay | Admitting: Physician Assistant

## 2023-02-27 ENCOUNTER — Encounter: Payer: Self-pay | Admitting: Hematology and Oncology

## 2023-02-27 LAB — T4: T4, Total: 9.5 ug/dL (ref 4.5–12.0)

## 2023-03-03 ENCOUNTER — Other Ambulatory Visit: Payer: Self-pay | Admitting: Hematology and Oncology

## 2023-03-11 ENCOUNTER — Encounter: Payer: Self-pay | Admitting: Physician Assistant

## 2023-03-11 ENCOUNTER — Other Ambulatory Visit: Payer: Self-pay | Admitting: Physical Medicine and Rehabilitation

## 2023-03-11 DIAGNOSIS — H905 Unspecified sensorineural hearing loss: Secondary | ICD-10-CM | POA: Diagnosis not present

## 2023-03-13 ENCOUNTER — Encounter (HOSPITAL_COMMUNITY): Payer: 59

## 2023-03-14 ENCOUNTER — Encounter: Payer: 59 | Admitting: Physical Medicine and Rehabilitation

## 2023-03-18 ENCOUNTER — Telehealth: Payer: Self-pay | Admitting: Hematology and Oncology

## 2023-03-19 ENCOUNTER — Ambulatory Visit: Payer: 59 | Admitting: Physician Assistant

## 2023-03-19 ENCOUNTER — Other Ambulatory Visit: Payer: 59

## 2023-03-19 ENCOUNTER — Ambulatory Visit: Payer: 59

## 2023-03-19 ENCOUNTER — Encounter: Payer: 59 | Admitting: Dietician

## 2023-03-21 ENCOUNTER — Other Ambulatory Visit: Payer: 59

## 2023-03-21 ENCOUNTER — Ambulatory Visit: Payer: 59 | Admitting: Hematology and Oncology

## 2023-03-21 ENCOUNTER — Ambulatory Visit: Payer: 59

## 2023-03-23 ENCOUNTER — Other Ambulatory Visit: Payer: Self-pay | Admitting: Physician Assistant

## 2023-03-23 DIAGNOSIS — F32A Depression, unspecified: Secondary | ICD-10-CM

## 2023-03-24 ENCOUNTER — Other Ambulatory Visit: Payer: Self-pay | Admitting: Nurse Practitioner

## 2023-03-24 ENCOUNTER — Other Ambulatory Visit: Payer: Self-pay | Admitting: Hematology and Oncology

## 2023-03-26 ENCOUNTER — Other Ambulatory Visit: Payer: Self-pay

## 2023-03-27 ENCOUNTER — Ambulatory Visit (INDEPENDENT_AMBULATORY_CARE_PROVIDER_SITE_OTHER): Payer: 59 | Admitting: Nurse Practitioner

## 2023-03-27 ENCOUNTER — Other Ambulatory Visit (INDEPENDENT_AMBULATORY_CARE_PROVIDER_SITE_OTHER): Payer: 59

## 2023-03-27 ENCOUNTER — Encounter: Payer: Self-pay | Admitting: Nurse Practitioner

## 2023-03-27 VITALS — BP 120/80 | HR 84 | Ht 63.0 in | Wt 173.2 lb

## 2023-03-27 DIAGNOSIS — Z860101 Personal history of adenomatous and serrated colon polyps: Secondary | ICD-10-CM

## 2023-03-27 DIAGNOSIS — R1310 Dysphagia, unspecified: Secondary | ICD-10-CM

## 2023-03-27 DIAGNOSIS — F17210 Nicotine dependence, cigarettes, uncomplicated: Secondary | ICD-10-CM | POA: Diagnosis not present

## 2023-03-27 DIAGNOSIS — R1011 Right upper quadrant pain: Secondary | ICD-10-CM

## 2023-03-27 DIAGNOSIS — K219 Gastro-esophageal reflux disease without esophagitis: Secondary | ICD-10-CM | POA: Diagnosis not present

## 2023-03-27 DIAGNOSIS — D509 Iron deficiency anemia, unspecified: Secondary | ICD-10-CM

## 2023-03-27 DIAGNOSIS — R932 Abnormal findings on diagnostic imaging of liver and biliary tract: Secondary | ICD-10-CM

## 2023-03-27 LAB — CBC WITH DIFFERENTIAL/PLATELET
Basophils Absolute: 0.1 10*3/uL (ref 0.0–0.1)
Basophils Relative: 0.7 % (ref 0.0–3.0)
Eosinophils Absolute: 0.5 10*3/uL (ref 0.0–0.7)
Eosinophils Relative: 6.1 % — ABNORMAL HIGH (ref 0.0–5.0)
HCT: 45.1 % (ref 36.0–46.0)
Hemoglobin: 14.8 g/dL (ref 12.0–15.0)
Lymphocytes Relative: 32.4 % (ref 12.0–46.0)
Lymphs Abs: 2.5 10*3/uL (ref 0.7–4.0)
MCHC: 32.8 g/dL (ref 30.0–36.0)
MCV: 96.4 fL (ref 78.0–100.0)
Monocytes Absolute: 0.5 10*3/uL (ref 0.1–1.0)
Monocytes Relative: 6.7 % (ref 3.0–12.0)
Neutro Abs: 4.2 10*3/uL (ref 1.4–7.7)
Neutrophils Relative %: 54.1 % (ref 43.0–77.0)
Platelets: 207 10*3/uL (ref 150.0–400.0)
RBC: 4.68 Mil/uL (ref 3.87–5.11)
RDW: 15.5 % (ref 11.5–15.5)
WBC: 7.7 10*3/uL (ref 4.0–10.5)

## 2023-03-27 LAB — PROTIME-INR
INR: 1 {ratio} (ref 0.8–1.0)
Prothrombin Time: 10.9 s (ref 9.6–13.1)

## 2023-03-27 LAB — COMPREHENSIVE METABOLIC PANEL
ALT: 9 U/L (ref 0–35)
AST: 14 U/L (ref 0–37)
Albumin: 3.8 g/dL (ref 3.5–5.2)
Alkaline Phosphatase: 65 U/L (ref 39–117)
BUN: 13 mg/dL (ref 6–23)
CO2: 31 meq/L (ref 19–32)
Calcium: 8.8 mg/dL (ref 8.4–10.5)
Chloride: 97 meq/L (ref 96–112)
Creatinine, Ser: 1.23 mg/dL — ABNORMAL HIGH (ref 0.40–1.20)
GFR: 45.59 mL/min — ABNORMAL LOW (ref >60.00)
Glucose, Bld: 87 mg/dL (ref 70–99)
Potassium: 3.9 meq/L (ref 3.5–5.1)
Sodium: 134 meq/L — ABNORMAL LOW (ref 135–145)
Total Bilirubin: 0.4 mg/dL (ref 0.2–1.2)
Total Protein: 6.5 g/dL (ref 6.0–8.3)

## 2023-03-27 NOTE — Progress Notes (Unsigned)
 03/27/2023 [name] [id number] 05/08/1955   Chief Complaint:  History of Present Illness: [name] is a 68 year old female with a past medical history of anxiety, depression, hypertension, hyperlipidemia, DM II, COPD (no longer uses home oxygen), metastatic small cell lung cancer with metastatic disease initially diagnosed in 2022 related with chemo and immunotherapy, E. Coli sepsis 11/2020, back pain, hypothyroidism, IDA, GERD and colon polys   She endorses having RUQ pain started 6 weeks ago. She has chronic nausea. No vomiting.  Once every 3 weeks, skipped last session, last one done end of Jan 2025, on chemo 10/2020.  Next 04/10/2023.   No diarrhea. Taking a stool softener. Normal formed stools daily. No rectal bleeding or black stools. No meat. Mostly vegetables.   Goody powder 3 to 4 times daily for HA 30 yrs  Swallow study  [facility] pulm 4 times weekly saliva  CTAP 12/2022.  GI PROCEDURES:   EUS by Dr. Christella Hartigan (PET scan showed periduodenal mass and a lung mass 10/19/2020: 1.7 cm mas involving the outer wall of the second duodenum. Malignant cells consistent with small cell undifferentiated carcinoma    EGD 07/24/2017 showed evidence of previous peptic ulcer disease in the prepyloric region, mild gastritis was negative for H. Pylori.  A colonoscopy was done on the same date which showed a 3mm tubular adenomatous polyp removed from the ascending colon and diverticulosis to the left colon. Recall colonoscopy 5 years.     FINDINGS: ULTRASOUND ABDOMEN   Gallbladder: Prior cholecystectomy   Common bile duct: Diameter: Mildly prominent related to post cholecystectomy state, 8 mm.   Liver: Coarsened echotexture, mildly nodular contours. Findings suspicious for early cirrhosis. No focal hepatic abnormality. Portal vein is patent on color Doppler imaging with normal direction of blood flow towards the liver.   IVC: No abnormality visualized.   Pancreas:  Visualized portion unremarkable.   Spleen: Upper limits normal with a craniocaudal length of 12.5 cm and a splenic volume of 394 mL.   Right Kidney: Length: 10.9 cm. Echogenicity within normal limits. No mass or hydronephrosis visualized.   Left Kidney: Length: 10.3 cm. Echogenicity within normal limits. No mass or hydronephrosis visualized.   Abdominal aorta: No aneurysm visualized.   Other findings: None.   ULTRASOUND HEPATIC ELASTOGRAPHY   Device: Siemens Helix VTQ   Patient position: Supine   Transducer DAX   Number of measurements: 12   Hepatic segment:  8   Median kPa: 2.4   IQR: 0.7   IQR/Median kPa ratio: 0.29   Data quality:  Good   Diagnostic category:  < or = 5 kPa: high probability of being normal Current Medications, Allergies, Past Medical History, Past Surgical History, Family History and Social History were reviewed in [employer] record.   Review of Systems:   Constitutional: Negative for fever, sweats, chills or weight loss.  Respiratory: Negative for shortness of breath.   Cardiovascular: Negative for chest pain, palpitations and leg swelling.  Gastrointestinal: See HPI.  Musculoskeletal: Negative for back pain or muscle aches.  Neurological: Negative for dizziness, headaches or paresthesias.    Physical Exam: BP 120/80   Pulse 84   Ht 5\' 3"  (1.6 m)   Wt 173 lb 4 oz (78.6 kg)   BMI 30.69 kg/m   Wt Readings from Last 3 Encounters:  03/27/23 173 lb 4 oz (78.6 kg)  02/26/23 170 lb 8 oz (77.3 kg)  02/21/23 169 lb (76.7 kg)  General: in no acute distress. Head: Normocephalic and atraumatic. Eyes: No scleral icterus. Conjunctiva pink . Ears: Normal auditory acuity. Mouth: Dentition intact. No ulcers or lesions.  Lungs: Clear throughout to auscultation. Heart: Regular rate and rhythm, no murmur. Abdomen: Soft, nontender and nondistended. No masses or hepatomegaly. Normal bowel sounds x 4 quadrants.  Rectal:  Deferred.  Musculoskeletal: Symmetrical with no gross deformities. Extremities: No edema. Neurological: Alert oriented x 4. No focal deficits.  Psychological: Alert and cooperative. Normal mood and affect  Assessment and Recommendations: ***

## 2023-03-27 NOTE — Patient Instructions (Addendum)
 Your provider has requested that you go to the basement level for lab work before leaving today. Press "B" on the elevator. The lab is located at the first door on the left as you exit the elevator.  Decrease BC powder use.  Follow up with your primary care provider regarding alterate pain medication.  Follow up with Dr.Armbruster in 4 months. You can call around early April to get the follow up appointment scheduled.  Due to recent changes in healthcare laws, you may see the results of your imaging and laboratory studies on MyChart before your provider has had a chance to review them.  We understand that in some cases there may be results that are confusing or concerning to you. Not all laboratory results come back in the same time frame and the provider may be waiting for multiple results in order to interpret others.  Please give [place] 48 hours in order for your provider to thoroughly review all the results before contacting the office for clarification of your results.   Thank you for trusting me with your gastrointestinal care!   Alcide Evener, CRNP

## 2023-03-28 ENCOUNTER — Other Ambulatory Visit: Payer: Self-pay | Admitting: Physician Assistant

## 2023-03-28 ENCOUNTER — Other Ambulatory Visit: Payer: Self-pay

## 2023-03-28 ENCOUNTER — Ambulatory Visit (HOSPITAL_COMMUNITY)
Admission: RE | Admit: 2023-03-28 | Discharge: 2023-03-28 | Disposition: A | Discharge status: Home / Self Care | Payer: 59 | Source: Ambulatory Visit | Attending: Pulmonary Disease | Admitting: Pulmonary Disease

## 2023-03-28 ENCOUNTER — Other Ambulatory Visit: Payer: Self-pay | Admitting: Physical Medicine and Rehabilitation

## 2023-03-28 DIAGNOSIS — C3490 Malignant neoplasm of unspecified part of unspecified bronchus or lung: Secondary | ICD-10-CM | POA: Diagnosis not present

## 2023-03-28 DIAGNOSIS — K219 Gastro-esophageal reflux disease without esophagitis: Secondary | ICD-10-CM | POA: Insufficient documentation

## 2023-03-28 DIAGNOSIS — K5900 Constipation, unspecified: Secondary | ICD-10-CM | POA: Insufficient documentation

## 2023-03-28 DIAGNOSIS — R059 Cough, unspecified: Secondary | ICD-10-CM | POA: Insufficient documentation

## 2023-03-28 DIAGNOSIS — Z79899 Other long term (current) drug therapy: Secondary | ICD-10-CM | POA: Insufficient documentation

## 2023-03-28 DIAGNOSIS — Z9221 Personal history of antineoplastic chemotherapy: Secondary | ICD-10-CM | POA: Insufficient documentation

## 2023-03-28 DIAGNOSIS — F32A Depression, unspecified: Secondary | ICD-10-CM | POA: Insufficient documentation

## 2023-03-28 DIAGNOSIS — R638 Other symptoms and signs concerning food and fluid intake: Secondary | ICD-10-CM | POA: Diagnosis not present

## 2023-03-28 DIAGNOSIS — R1313 Dysphagia, pharyngeal phase: Secondary | ICD-10-CM | POA: Diagnosis not present

## 2023-03-28 DIAGNOSIS — Z9181 History of falling: Secondary | ICD-10-CM | POA: Insufficient documentation

## 2023-03-28 DIAGNOSIS — F419 Anxiety disorder, unspecified: Secondary | ICD-10-CM

## 2023-03-28 DIAGNOSIS — R053 Chronic cough: Secondary | ICD-10-CM

## 2023-03-28 DIAGNOSIS — J449 Chronic obstructive pulmonary disease, unspecified: Secondary | ICD-10-CM | POA: Insufficient documentation

## 2023-03-28 DIAGNOSIS — G4730 Sleep apnea, unspecified: Secondary | ICD-10-CM | POA: Insufficient documentation

## 2023-03-28 DIAGNOSIS — R1310 Dysphagia, unspecified: Secondary | ICD-10-CM | POA: Diagnosis not present

## 2023-03-28 DIAGNOSIS — Z923 Personal history of irradiation: Secondary | ICD-10-CM | POA: Insufficient documentation

## 2023-03-28 LAB — AFP TUMOR MARKER: AFP-Tumor Marker: 4.8 ng/mL

## 2023-03-28 NOTE — Telephone Encounter (Signed)
 Last OV: 01/06/23  Next OV: 05/08/23  Last FIlled: 01/06/23  Quantity: 90 w/ 2 refills  Called pharmacy to verify and was advised by Olegario Messier pt is taking as prescribed TID and has no more refills on file.

## 2023-03-28 NOTE — Progress Notes (Incomplete)
03/28/23 1400  SLP Visit Information  SLP Received On 03/28/23  General Information  HPI pt is a 68 yo female referred for OP MBS by Dr Everardo All due to concerns for aspiration.  Pt PMH + for COPD, stage IV lung cancer s/p chemoradiation and immunotherapy, depression, GERD, PUD, self reported sleep apnea, prior oxygen use at night *she stopped due to getting cord wrapped around her neck, constipation, fall 2024.  Pt has undergone prior endoscopy and was diagnosed previously with gastritis.  Med list includes topamax, trelegy, mobic, synthroid, abilify, prilosec, zofran, xanax prn, and compazine prn.  She states she has been on the same reflux medication for years and it manages her symptoms unless she eats [employer].  She uses goody powders 3-4 times daily for headache over the last 30 years and she continues to smoke.  CT chest 01/08/2023 showed no consolidation, pneumothorax or effusion, atypical pleural thickening, emphysematous lung changes.  No recurrence of lung cancer per imaging.  Pt reported symptoms appear consistent with primary esophageal deficits - as she senses esophaeal retention with po. She does not drink liquids with her meals.  States she will get "choked" approx 3-4 times a week at random - occurs with both solids and liquids.  States she choked on a piece of ham approx 2 years ago - but did not inform her MD.  She does endorse hoarseness in am at times. States dysphagia symptoms started approx 2 years ago. Denies any specific neurological diagnosis.  Denies pneumonias nor unintentional weight loss.  Caregiver present No  Diet Prior to this Study Regular;Thin liquids (Level 0)  Temperature  Normal  Respiratory Status WFL  Supplemental O2 None (Room air)  History of Recent Intubation No  Behavior/Cognition Alert;Cooperative;Pleasant mood  Self-Feeding Abilities Able to self-feed  Baseline vocal quality/speech Normal  Volitional Cough Able to elicit  Volitional Cough Assessment  Appears WFL  Volitional Swallow Able to elicit  Anatomy WFL  Orofacial Exam  Oral Cavity: Oral Hygiene WFL  Oral Cavity - Dentition Dentures, top;Dentures, bottom  Orofacial Anatomy WFL  Oral Motor/Sensory Function WFL  Boluses Administered  Boluses Administered Thin liquids (Level 0);Mildly thick liquids (Level 2, nectar thick);Moderately thick liquids (Level 3, honey thick);Puree;Solid  Oral Impairment Domain  Lip Closure No labial escape  Tongue control during bolus hold Cohesive bolus between tongue to palatal seal  Bolus preparation/mastication Timely and efficient chewing and mashing  Bolus transport/lingual motion Brisk tongue motion  Oral residue Trace residue lining oral structures  Location of oral residue  Tongue  Initiation of pharyngeal swallow  Pyriform sinuses  Pharyngeal Impairment Domain  Soft palate elevation No bolus between soft palate (SP)/pharyngeal wall (PW)  Laryngeal elevation Partial superior movement of thyroid cartilage/partial approximation of arytenoids to epiglottic petiole  Anterior hyoid excursion Complete anterior movement  Epiglottic movement Complete inversion  Laryngeal vestibule closure Incomplete, narrow column air/contrast in laryngeal vestibule  Pharyngeal stripping wave  Present - complete  Pharyngeal contraction (A/P view only) N/A  Pharyngoesophageal segment opening Complete distension and complete duration, no obstruction of flow  Tongue base retraction No contrast between tongue base and posterior pharyngeal wall (PPW)  Pharyngeal residue Complete pharyngeal clearance  Location of pharyngeal residue N/A  Esophageal Impairment Domain  Esophageal clearance upright position Complete clearance, esophageal coating  Pill  Consistency administered Thin liquids (Level 0)  Thin liquids (Level 0) Impaired (see clinical impressions)  Penetration/Aspiration Scale Score  1.  Material does not enter airway Moderately thick liquids (Level 3,  honey  thick);Puree;Solid;Pill  3.  Material enters airway, remains ABOVE vocal cords and not ejected out Mildly thick liquids (Level 2, nectar thick)  8.  Material enters airway, passes BELOW cords without attempt by patient to eject out (silent aspiration)  Thin liquids (Level 0)  Compensatory Strategies  Compensatory strategies Yes  Chin tuck Ineffective  Ineffective Chin Tuck Thin liquid (Level 0)  Other(comment) Effective (very small boluses)  Effective Other(comment) Thin liquid (Level 0)  Clinical Impression  Clinical Impression Patient presents with minimal pharyngeal dysphagia resulting in consistent mild laryngeal penetration of thin liquid when consuming larger than a tiny sip.  Trace silent aspiration noted once as barium peaked just below the vocal cords.  Cued coughing cleared all penetrates and trace aspirates.  Laryngeal closure timing and adequacy slightly impaired.  Very small single sips were tolerated without penetration.  Chin tuck posture was not helpful as it continued to allow penetration.   Swallow with solid, pure and honey thick liquid was normal.  Patient notably tossed her head back to aid transit of barium tablet with thin barium resulting in tablet spilling to vallecular space and deep penetration of thin liquid.  Advised patient consider taking meds with Ensure, tomato juice, slightly thicker liquid or applesauce.  Given patient reports discomfort when coughing and choking during meals advised that she monitor her when her dysphagia symptoms are occurring and take copious notes to provide detailed information to her physician about this issue.  SLP did not observe slight aspiration until following the MBS therefore also recommended patient consider reclining in chair approximately 45 degrees with thin liquids to see if this would retain boluses in posterior pharynx to prevent aspiration.  Further recommend consider consuming slightly thicker liquids during her meals and saving  her thin water for between her meals if improves comfort.  Endorsed importance however of patient maintaining her hydration with understanding of goal to maximize her comfort with intake.  Thank you so much for this consult of this most pleasant patient.  SLP Visit Diagnosis Dysphagia, pharyngeal phase (R13.13)  Exam Limitations No limitations  Swallowing Evaluation Recommendations  Recommendations PO diet  PO Diet Recommendation Regular;Thin liquids (Level 0) (Consider slightly thicker liquids with minimal if improves comfort.)  Liquid Administration via Cup;Straw  Medication Administration Whole meds with liquid  Supervision Patient able to self-feed  Swallowing strategies   Slow rate;Small bites/sips (Intermittent throat clear and swallow)  Postural changes  (Consider slightly reclined if improves comfort decreases coughing with thin liquid)  Oral care recommendations Oral care BID (2x/day)  Treatment Plan  Follow-up recommendations No SLP follow up  SLP Time Calculation  SLP Start Time (ACUTE ONLY) 1309  SLP Stop Time (ACUTE ONLY) 1355  SLP Time Calculation (min) (ACUTE ONLY) 46 min  SLP Evaluations  $ SLP Speech Visit 1 Visit  SLP Evaluations  $Outpatient MBS Swallow 1 Procedure  $Swallowing Treatment 1 Procedure

## 2023-03-31 ENCOUNTER — Other Ambulatory Visit: Payer: Self-pay | Admitting: Nurse Practitioner

## 2023-03-31 ENCOUNTER — Encounter (HOSPITAL_BASED_OUTPATIENT_CLINIC_OR_DEPARTMENT_OTHER): Payer: Self-pay | Admitting: Pulmonary Disease

## 2023-03-31 ENCOUNTER — Ambulatory Visit: Payer: Self-pay | Admitting: Physician Assistant

## 2023-03-31 DIAGNOSIS — R600 Localized edema: Secondary | ICD-10-CM

## 2023-03-31 NOTE — Progress Notes (Signed)
 Agree with assessment and plan as outlined. I agree she should completely avoid all NSAIDs given her symptoms, at high risk for PUD with taking that much Goody powder.  Agree with continuing PPI. Would await her barium studies, hopefully we can avoid putting her through an EGD.  I would not recommend any further surveillance colonoscopies as long as she is dealing with stage IV malignancy, her last colonoscopy showed no high risk lesions. In regards to surveillance imaging of her liver, looks like she is getting CT scan chest abdomen pelvis for her malignancy periodically and that should suffice to look at her liver

## 2023-03-31 NOTE — Telephone Encounter (Signed)
 Noted.

## 2023-03-31 NOTE — Progress Notes (Signed)
 Recall entered

## 2023-03-31 NOTE — Telephone Encounter (Signed)
 Chief Complaint: hematuria Symptoms: hematuria 3x beginning today, back and abdominal pain Frequency: today Pertinent Negatives: Patient denies fever, N/V Disposition: [] ED /[] Urgent Care (no appt availability in office) / [x] Appointment(In office/virtual)/ []  [facility]/ [] Home Care/ [] Refused Recommended Disposition /[] [facility]/ []  Follow-up with PCP Additional Notes: Pt reports hematuria beginning today. States she has noticed pieces of blood ("just a little") in her urine 3x today. Also endorses back pain that radiates to her hips. Pt states she has chronic back pain that has been worse recently. Taking hydrocodone but states that is minimally effective. States she has had lower abdominal pain "like I am fixing to start my period" that has "been a problem for years." Denies burning with urination or urinary frequency and urgency. Denies fever, N/V/D. Per protocol RN advised pt she should be seen within 24 hrs. No availability at the office in that time frame. Pt asked for a Wednesday appt because she has another appt on Wednesday and would like to cluster her appts. RN called the CAL, CAL advised RN it would be acceptable to schedule her on that day. RN scheduled pt for Wednesday 3/5 at 2pm.  RN advised pt she needs to call us back if she worsens, especially if she notices an increase in hematuria, if she urinates bright red blood, or passes large clots, if she develops a fever, if she has N/V, she verbalized understanding. Additionally, pt states she needs to follow up with her PCP about abnormal labs she had performed at a gastro appt.    Copied from CRM [id number]. Topic: Clinical - Red Word Triage >> Mar 31, 2023  3:35 PM Gurney Maxin H wrote: [facility] that prompted transfer to Nurse Triage: Blood in urine. Reason for Disposition  Side (flank) or back pain present  Answer Assessment - Initial Assessment Questions 1. COLOR of URINE: "Describe the color of the urine."   (e.g., tea-colored, pink, red, bloody) "Do you have blood clots in your urine?" (e.g., none, pea, grape, small coin)     3x "just a little bit", pieces of blood 2. ONSET: "When did the bleeding start?"      This AM 3. EPISODES: "How many times has there been blood in the urine?" or "How many times today?"     3x 4. PAIN with URINATION: "Is there any pain with passing your urine?" If Yes, ask: "How bad is the pain?"  (Scale 1-10; or mild, moderate, severe)    - MILD: Complains slightly about urination hurting.    - MODERATE: Interferes with normal activities.      - SEVERE: Excruciating, unwilling or unable to urinate because of the pain.      "Not really", 0/10 5. FEVER: "Do you have a fever?" If Yes, ask: "What is your temperature, how was it measured, and when did it start?"     No but "I stay cold"  6. ASSOCIATED SYMPTOMS: "Are you passing urine more frequently than usual?"     Not really, about the same  7. OTHER SYMPTOMS: "Do you have any other symptoms?" (e.g., back/flank pain, abdomen pain, vomiting)     Back pain off and on for two months ("I have chronic back pain but it's been worse" - 10/10 pain), hydrocodone not effective, pain radiates into the hips and down both legs, abdominal pain on and off for months "as if I was fixing to start my period", gets her 125 lb dog to lay on her lap and she says  that pressure helps, no N/V/D, no fevers, "I stay cold", pt states abdominal pain has been a problem for years  Protocols used: Urine - Blood In-A-AH

## 2023-04-02 ENCOUNTER — Ambulatory Visit: Admitting: Physician Assistant

## 2023-04-02 DIAGNOSIS — Z96641 Presence of right artificial hip joint: Secondary | ICD-10-CM | POA: Diagnosis not present

## 2023-04-02 DIAGNOSIS — M25561 Pain in right knee: Secondary | ICD-10-CM | POA: Diagnosis not present

## 2023-04-02 DIAGNOSIS — M25562 Pain in left knee: Secondary | ICD-10-CM | POA: Diagnosis not present

## 2023-04-02 DIAGNOSIS — M25552 Pain in left hip: Secondary | ICD-10-CM | POA: Diagnosis not present

## 2023-04-08 DIAGNOSIS — M79644 Pain in right finger(s): Secondary | ICD-10-CM | POA: Diagnosis not present

## 2023-04-08 DIAGNOSIS — M5459 Other low back pain: Secondary | ICD-10-CM | POA: Diagnosis not present

## 2023-04-08 DIAGNOSIS — M7918 Myalgia, other site: Secondary | ICD-10-CM | POA: Diagnosis not present

## 2023-04-08 DIAGNOSIS — M791 Myalgia, unspecified site: Secondary | ICD-10-CM | POA: Diagnosis not present

## 2023-04-09 ENCOUNTER — Ambulatory Visit: Payer: 59 | Admitting: Dermatology

## 2023-04-10 ENCOUNTER — Inpatient Hospital Stay: Payer: 59 | Admitting: Dietician

## 2023-04-10 ENCOUNTER — Inpatient Hospital Stay: Payer: 59

## 2023-04-10 ENCOUNTER — Inpatient Hospital Stay: Payer: 59 | Attending: Hematology and Oncology

## 2023-04-10 ENCOUNTER — Inpatient Hospital Stay (HOSPITAL_BASED_OUTPATIENT_CLINIC_OR_DEPARTMENT_OTHER): Payer: 59 | Admitting: Hematology and Oncology

## 2023-04-10 VITALS — BP 150/85 | HR 86 | Temp 98.2°F | Resp 16 | Wt 174.2 lb

## 2023-04-10 DIAGNOSIS — F419 Anxiety disorder, unspecified: Secondary | ICD-10-CM | POA: Diagnosis not present

## 2023-04-10 DIAGNOSIS — Z823 Family history of stroke: Secondary | ICD-10-CM | POA: Diagnosis not present

## 2023-04-10 DIAGNOSIS — Z8249 Family history of ischemic heart disease and other diseases of the circulatory system: Secondary | ICD-10-CM | POA: Diagnosis not present

## 2023-04-10 DIAGNOSIS — J449 Chronic obstructive pulmonary disease, unspecified: Secondary | ICD-10-CM | POA: Insufficient documentation

## 2023-04-10 DIAGNOSIS — C3431 Malignant neoplasm of lower lobe, right bronchus or lung: Secondary | ICD-10-CM | POA: Insufficient documentation

## 2023-04-10 DIAGNOSIS — Z8719 Personal history of other diseases of the digestive system: Secondary | ICD-10-CM | POA: Insufficient documentation

## 2023-04-10 DIAGNOSIS — Z5986 Financial insecurity: Secondary | ICD-10-CM | POA: Diagnosis not present

## 2023-04-10 DIAGNOSIS — Z79620 Long term (current) use of immunosuppressive biologic: Secondary | ICD-10-CM | POA: Diagnosis not present

## 2023-04-10 DIAGNOSIS — Z90710 Acquired absence of both cervix and uterus: Secondary | ICD-10-CM | POA: Diagnosis not present

## 2023-04-10 DIAGNOSIS — Z880 Allergy status to penicillin: Secondary | ICD-10-CM | POA: Insufficient documentation

## 2023-04-10 DIAGNOSIS — Z882 Allergy status to sulfonamides status: Secondary | ICD-10-CM | POA: Diagnosis not present

## 2023-04-10 DIAGNOSIS — K7460 Unspecified cirrhosis of liver: Secondary | ICD-10-CM | POA: Insufficient documentation

## 2023-04-10 DIAGNOSIS — Z5112 Encounter for antineoplastic immunotherapy: Secondary | ICD-10-CM | POA: Insufficient documentation

## 2023-04-10 DIAGNOSIS — Z90721 Acquired absence of ovaries, unilateral: Secondary | ICD-10-CM | POA: Insufficient documentation

## 2023-04-10 DIAGNOSIS — E785 Hyperlipidemia, unspecified: Secondary | ICD-10-CM | POA: Insufficient documentation

## 2023-04-10 DIAGNOSIS — Z885 Allergy status to narcotic agent status: Secondary | ICD-10-CM | POA: Diagnosis not present

## 2023-04-10 DIAGNOSIS — Z95828 Presence of other vascular implants and grafts: Secondary | ICD-10-CM

## 2023-04-10 DIAGNOSIS — Z9049 Acquired absence of other specified parts of digestive tract: Secondary | ICD-10-CM | POA: Diagnosis not present

## 2023-04-10 DIAGNOSIS — F17210 Nicotine dependence, cigarettes, uncomplicated: Secondary | ICD-10-CM | POA: Diagnosis not present

## 2023-04-10 DIAGNOSIS — Z79899 Other long term (current) drug therapy: Secondary | ICD-10-CM | POA: Insufficient documentation

## 2023-04-10 DIAGNOSIS — Z888 Allergy status to other drugs, medicaments and biological substances status: Secondary | ICD-10-CM | POA: Insufficient documentation

## 2023-04-10 DIAGNOSIS — E876 Hypokalemia: Secondary | ICD-10-CM | POA: Insufficient documentation

## 2023-04-10 DIAGNOSIS — Z825 Family history of asthma and other chronic lower respiratory diseases: Secondary | ICD-10-CM | POA: Insufficient documentation

## 2023-04-10 DIAGNOSIS — Z8261 Family history of arthritis: Secondary | ICD-10-CM | POA: Diagnosis not present

## 2023-04-10 DIAGNOSIS — Z814 Family history of other substance abuse and dependence: Secondary | ICD-10-CM | POA: Insufficient documentation

## 2023-04-10 DIAGNOSIS — Z886 Allergy status to analgesic agent status: Secondary | ICD-10-CM | POA: Diagnosis not present

## 2023-04-10 DIAGNOSIS — Z809 Family history of malignant neoplasm, unspecified: Secondary | ICD-10-CM | POA: Insufficient documentation

## 2023-04-10 DIAGNOSIS — F32A Depression, unspecified: Secondary | ICD-10-CM | POA: Diagnosis not present

## 2023-04-10 DIAGNOSIS — Z811 Family history of alcohol abuse and dependence: Secondary | ICD-10-CM | POA: Insufficient documentation

## 2023-04-10 LAB — CBC WITH DIFFERENTIAL (CANCER CENTER ONLY)
Abs Immature Granulocytes: 0.11 10*3/uL — ABNORMAL HIGH (ref 0.00–0.07)
Basophils Absolute: 0 10*3/uL (ref 0.0–0.1)
Basophils Relative: 0 %
Eosinophils Absolute: 0 10*3/uL (ref 0.0–0.5)
Eosinophils Relative: 0 %
HCT: 41.1 % (ref 36.0–46.0)
Hemoglobin: 14.3 g/dL (ref 12.0–15.0)
Immature Granulocytes: 1 %
Lymphocytes Relative: 16 %
Lymphs Abs: 1.5 10*3/uL (ref 0.7–4.0)
MCH: 31.1 pg (ref 26.0–34.0)
MCHC: 34.8 g/dL (ref 30.0–36.0)
MCV: 89.3 fL (ref 80.0–100.0)
Monocytes Absolute: 0.4 10*3/uL (ref 0.1–1.0)
Monocytes Relative: 5 %
Neutro Abs: 7 10*3/uL (ref 1.7–7.7)
Neutrophils Relative %: 78 %
Platelet Count: 188 10*3/uL (ref 150–400)
RBC: 4.6 MIL/uL (ref 3.87–5.11)
RDW: 13.9 % (ref 11.5–15.5)
WBC Count: 9 10*3/uL (ref 4.0–10.5)
nRBC: 0 % (ref 0.0–0.2)

## 2023-04-10 LAB — CMP (CANCER CENTER ONLY)
ALT: 11 U/L (ref 0–44)
AST: 14 U/L — ABNORMAL LOW (ref 15–41)
Albumin: 3.9 g/dL (ref 3.5–5.0)
Alkaline Phosphatase: 66 U/L (ref 38–126)
Anion gap: 5 (ref 5–15)
BUN: 13 mg/dL (ref 8–23)
CO2: 28 mmol/L (ref 22–32)
Calcium: 8.1 mg/dL — ABNORMAL LOW (ref 8.9–10.3)
Chloride: 95 mmol/L — ABNORMAL LOW (ref 98–111)
Creatinine: 1.12 mg/dL — ABNORMAL HIGH (ref 0.44–1.00)
GFR, Estimated: 54 mL/min — ABNORMAL LOW (ref >60)
Glucose, Bld: 129 mg/dL — ABNORMAL HIGH (ref 70–99)
Potassium: 3.6 mmol/L (ref 3.5–5.1)
Sodium: 128 mmol/L — ABNORMAL LOW (ref 135–145)
Total Bilirubin: 0.4 mg/dL (ref 0.0–1.2)
Total Protein: 6.5 g/dL (ref 6.5–8.1)

## 2023-04-10 LAB — TSH: TSH: 0.865 u[IU]/mL (ref 0.350–4.500)

## 2023-04-10 MED ORDER — HEPARIN SOD (PORK) LOCK FLUSH 100 UNIT/ML IV SOLN
500.0000 [IU] | Freq: Once | INTRAVENOUS | Status: AC | PRN
  Administered 2023-04-10: 500 [IU]

## 2023-04-10 MED ORDER — SODIUM CHLORIDE 0.9% FLUSH
10.0000 mL | INTRAVENOUS | Status: DC | PRN
  Administered 2023-04-10: 10 mL

## 2023-04-10 MED ORDER — ONDANSETRON HCL 4 MG/2ML IJ SOLN
8.0000 mg | Freq: Once | INTRAMUSCULAR | Status: AC
  Administered 2023-04-10: 8 mg via INTRAVENOUS
  Filled 2023-04-10: qty 4

## 2023-04-10 MED ORDER — SODIUM CHLORIDE 0.9 % IV SOLN
1200.0000 mg | Freq: Once | INTRAVENOUS | Status: AC
  Administered 2023-04-10: 1200 mg via INTRAVENOUS
  Filled 2023-04-10: qty 20

## 2023-04-10 MED ORDER — SODIUM CHLORIDE 0.9 % IV SOLN
Freq: Once | INTRAVENOUS | Status: AC
Start: 2023-04-10 — End: 2023-04-10

## 2023-04-10 MED ORDER — SODIUM CHLORIDE 0.9% FLUSH
10.0000 mL | Freq: Once | INTRAVENOUS | Status: AC
Start: 2023-04-10 — End: 2023-04-10
  Administered 2023-04-10: 10 mL

## 2023-04-10 NOTE — Progress Notes (Signed)
 [facility] [phone]   [phone]  PROGRESS NOTE  Patient Care Team: Allwardt, Crist Infante, PA-C as PCP - General (Physician Assistant) O'Neal, Ronnald Ramp, MD as PCP - Cardiology (Cardiology) Rachael Fee, MD (Inactive) as Attending Physician (Gastroenterology) Dr. Louie Bun, MD as Consulting Physician (Orthopedic Surgery) Ranee Gosselin, MD as Consulting Physician (Orthopedic Surgery) York Spaniel, MD (Inactive) as Consulting Physician (Neurology) Van Clines, MD as Consulting Physician (Neurology) Erroll Luna, [facility] (Inactive) (Pharmacist) Pll[facility], [facility]  Hematological/Oncological History # Small Cell Lung Cancer, Extensive Stage 07/05/2020: CT abdomen for lower abdominal pain. New pulmonary nodular density noted 07/06/2020: CT chest showed 2.2 cm macrolobulated right lower lobe pulmonary nodule (favored) versus pathologically enlarged infrahilar lymph node 10/13/2020: PET CT scan performed, findings show 2 cm right lower lobe lung mass is hypermetabolic and consistent with primary lung neoplasm. Additionally found hypermetabolic 17 mm soft tissue lesion between the descending duodenum and the pancreatic head  10/19/2020: EGD to biopsy hypermetabolic lymph node. Biopsy results show small cell lung cancer 10/26/2020: establish care with Dr. Leonides Schanz  11/13/2020: Cycle 1 Day 1 of Carbo/Etop/Atezolizumab 12/04/2020: Cycle 2 Day 1 of Carbo/Etop/Atezolizumab 11/22-11/25/2022: admitted for E. Coli bacteremia/sepsis. Start of Cycle 3 delayed. 01/02/2021: Cycle 3 Day 1 of Carbo/Etop/Atezolizumab 01/23/2021: Cycle 4 Day 1 of Carbo/Etop/Atezolizumab 02/22/2021: Cycle 5 Day 1 of Atezolizumab Maintenance. Delayed start due to patient's COVID infection.  03/14/2021: Cycle 6 Day 1 of Atezolizumab Maintenance 04/05/2021: Cycle 7 Day 1 of Atezolizumab Maintenance 04/25/2021: Cycle 8 Day 1 of Atezolizumab  Maintenance 05/16/2021: Cycle 9 Day 1 of Atezolizumab Maintenance 06/06/2021: Cycle 10 Day 1 of Atezolizumab Maintenance 06/27/2021:  Cycle 11 Day 1 of Atezolizumab Maintenance 07/18/2021: Cycle 12 Day 1 of Atezolizumab Maintenance 08/08/2021: Cycle 13 Day 1 of Atezolizumab Maintenance 08/29/2021: Cycle 14 Day 1 of Atezolizumab Maintenance 09/19/2021: Cycle 15 Day 1 of Atezolizumab Maintenance 10/10/2021: Cycle 16 Day 1 of Atezolizumab Maintenance 10/31/2021: Cycle 17 Day 1 of Atezolizumab Maintenance 11/21/2021: Cycle 18 Day 1 of Atezolizumab Maintenance 12/12/2021: Cycle 19 Day 1 of Atezolizumab Maintenance 01/02/2022: Cycle 20 Day 1 of Atezolizumab Maintenance 01/23/2022: Cycle 21 Day 1 of Atezolizumab Maintenance 02/13/2022: treatment HELD due to diarrhea.  03/06/2022: Cycle 22 Day 1 of Atezolizumab Maintenance 03/27/2022: Cycle 23 Day 1 of Atezolizumab Maintenance 04/17/2022: Cycle 24 Day 1 of Atezolizumab Maintenance 05/08/2022:  Cycle 25 Day 1 of Atezolizumab Maintenance (Held due to patient preference for treatment holiday) 05/29/2022: Cycle 25 Day 1 of Atezolizumab Maintenance  06/19/2022: Cycle 26 Day 1 of Atezolizumab Maintenance  07/12/2022: Cycle 27 Day 1 of Atezolizumab Maintenance 07/31/2022: Cycle 28 Day 1 of Atezolizumab Maintenance HELD per patient request for fatigue.  08/22/2022: Cycle 28 Day 1 of Atezolizumab Maintenance 09/11/2022: Cycle 29 Day 1 of Atezolizumab Maintenance 10/03/2022: Cycle 30 Day 1 of Atezolizumab Maintenance 10/23/2022: Cycle 31 Day 1 of Atezolizumab Maintenance 11/13/2022: Cycle 32 Day 1 of Atezolizumab Maintenance 12/24/2022: Cycle 33 Day 1 of Atezolizumab Maintenance 01/15/2023: Cycle 34 Day 1 of Atezolizumab Maintenance 02/05/2023: Cycle 35 Day 1 of Atezolizumab Maintenance 02/26/2023: Cycle 36 Day 1 of Atezolizumab Maintenance  Interval History:  [name] 68 y.o. female with medical history significant for extensive stage small cell lung cancer who  presents for a follow up visit. The patient's last visit was on 02/26/2023.  She presents today to start cycle 38 of chemotherapy.   On exam today [name] reports she has been well overall and  interim since her last visit.  She reports that she was feeling really well during her scheduled visit in February 2025 and therefore she skipped it.  She reports that she accompanied her grandson to an outpatient surgery this morning to put screws in his shoulder.  She reports her energy has been okay as has her appetite.  She reports that she is tolerating the infusions well without any major side effects.  She also did recently have cataracts removed.  She reports her blood pressure is elevated today because she was hurried to the clinic and did not have time to sit and catch her breath.  She reports she has had no recent illnesses such as runny nose, sore throat, or cough.  She reports that otherwise she remains at her baseline level of health and is willing and able to proceed with atezolizumab therapy at this time.  She denies fevers, chills, sweats, chest pain or cough. She has no other complaints.A full 10 point ROS is listed below.   MEDICAL HISTORY:  Past Medical History:  Diagnosis Date   Allergic rhinitis    Anemia    Anxiety    Chicken pox    Chronic back pain    COPD (chronic obstructive pulmonary disease) (HCC)    Depression    DM (diabetes mellitus) (HCC)    Essential hypertension    GERD (gastroesophageal reflux disease)    Headache    migraines   History of gastritis    EGD 2015   History of home oxygen therapy    2 liters at hs last 6 months   Hyperlipidemia    Hypothyroidism    Migraines    Osteoarthritis    oa   Scoliosis    Small cell lung cancer (HCC)    Stage 4    SURGICAL HISTORY: Past Surgical History:  Procedure Laterality Date   APPENDECTOMY     1985   BIOPSY  07/24/2017   Procedure: BIOPSY;  Surgeon: Rachael Fee, MD;  Location: [facility];  Service:  Endoscopy;;   CARDIAC CATHETERIZATION N/A 10/31/2015   Procedure: Left Heart Cath and Coronary Angiography;  Surgeon: Marykay Lex, MD;  Location: [facility] INVASIVE CV LAB;  Service: Cardiovascular;  Laterality: N/A;   CARPAL TUNNEL RELEASE Left    CARPAL TUNNEL RELEASE Right    CATARACT EXTRACTION W/PHACO Left 02/07/2023   Procedure: CATARACT EXTRACTION PHACO AND INTRAOCULAR LENS PLACEMENT (IOC);  Surgeon: Pecolia Ades, MD;  Location: [facility];  Service: Ophthalmology;  Laterality: Left;  CDE: 3.51   CATARACT EXTRACTION W/PHACO Right 02/21/2023   Procedure: CATARACT EXTRACTION PHACO AND INTRAOCULAR LENS PLACEMENT (IOC);  Surgeon: Pecolia Ades, MD;  Location: [facility];  Service: Ophthalmology;  Laterality: Right;  CDE: 2.94   CHOLECYSTECTOMY  late 1980's   COLONOSCOPY WITH PROPOFOL N/A 07/24/2017   Procedure: COLONOSCOPY WITH PROPOFOL;  Surgeon: Rachael Fee, MD;  Location: [facility];  Service: Endoscopy;  Laterality: N/A;   ESOPHAGOGASTRODUODENOSCOPY N/A 07/24/2017   Procedure: ESOPHAGOGASTRODUODENOSCOPY (EGD);  Surgeon: Rachael Fee, MD;  Location: [facility];  Service: Endoscopy;  Laterality: N/A;   ESOPHAGOGASTRODUODENOSCOPY (EGD) WITH PROPOFOL N/A 10/19/2020   Procedure: ESOPHAGOGASTRODUODENOSCOPY (EGD) WITH PROPOFOL;  Surgeon: Rachael Fee, MD;  Location: [facility];  Service: Endoscopy;  Laterality: N/A;   FINE NEEDLE ASPIRATION N/A 10/19/2020   Procedure: FINE NEEDLE ASPIRATION (FNA) LINEAR;  Surgeon: Rachael Fee, MD;  Location: [facility];  Service: Endoscopy;  Laterality: N/A;   GALLBLADDER SURGERY  1991  HIP CLOSED REDUCTION Right 01/08/2016   Procedure: CLOSED MANIPULATION HIP;  Surgeon: Jene Every, MD;  Location: [facility];  Service: Orthopedics;  Laterality: Right;   HIP CLOSED REDUCTION Right 01/19/2016   Procedure: ATTEMPTED CLOSED REDUCTION RIGHT HIP;  Surgeon: Toni Arthurs, MD;  Location: [facility];  Service: Orthopedics;  Laterality: Right;   HIP CLOSED  REDUCTION Right 01/20/2016   Procedure: CLOSED REDUCTION RIGHT TOTAL HIP;  Surgeon: Durene Romans, MD;  Location: [facility];  Service: Orthopedics;  Laterality: Right;   HIP CLOSED REDUCTION Right 02/17/2016   Procedure: CLOSED REDUCTION RIGHT TOTAL HIP;  Surgeon: Samson Frederic, MD;  Location: [facility];  Service: Orthopedics;  Laterality: Right;   HIP CLOSED REDUCTION Right 02/28/2016   Procedure: CLOSED REDUCTION HIP;  Surgeon: Yolonda Kida, MD;  Location: [facility];  Service: Orthopedics;  Laterality: Right;   IR IMAGING GUIDED PORT INSERTION  11/01/2020   POLYPECTOMY  07/24/2017   Procedure: POLYPECTOMY;  Surgeon: Rachael Fee, MD;  Location: [facility];  Service: Endoscopy;;   TONSILLECTOMY     TOTAL ABDOMINAL HYSTERECTOMY     1985, with 1 ovary removed and 2 nd ovary removed 2003   TOTAL HIP ARTHROPLASTY Right    Original surgery 2006 with revision 2010   TOTAL HIP REVISION Right 01/01/2016   Procedure: TOTAL HIP REVISION;  Surgeon: Durene Romans, MD;  Location: [facility];  Service: Orthopedics;  Laterality: Right;   TOTAL HIP REVISION Right 03/02/2016   Procedure: TOTAL HIP REVISION;  Surgeon: Durene Romans, MD;  Location: [facility];  Service: Orthopedics;  Laterality: Right;   TOTAL HIP REVISION Right 09/02/2016   Procedure: Right hip constrained liner- posterior;  Surgeon: Durene Romans, MD;  Location: [facility];  Service: Orthopedics;  Laterality: Right;   ULNAR NERVE TRANSPOSITION Right    UPPER ESOPHAGEAL ENDOSCOPIC ULTRASOUND (EUS) N/A 10/19/2020   Procedure: UPPER ESOPHAGEAL ENDOSCOPIC ULTRASOUND (EUS);  Surgeon: Rachael Fee, MD;  Location: [facility];  Service: Endoscopy;  Laterality: N/A;  periduodenal lesion    SOCIAL HISTORY: Social History   Socioeconomic History   Marital status: Married    Spouse name: Not on file   Number of children: 2   Years of education: Not on file   Highest education level: 12th grade  Occupational History   Occupation: disabled   Occupation:  disabled  Tobacco Use   Smoking status: Every Day    Current packs/day: 2.00    Average packs/day: 2.0 packs/day for 46.0 years (92.0 ttl pk-yrs)    Types: Cigarettes   Smokeless tobacco: Never   Tobacco comments:    2 packs of cigarettes smoked daily 12/13/21- declines smoking cessation  Vaping Use   Vaping status: Never Used  Substance and Sexual Activity   Alcohol use: No   Drug use: No   Sexual activity: Not Currently    Partners: Male  Other Topics Concern   Not on file  Social History Narrative   Right handed    Caffeine~ 2 cups per day    Lives at home with husband (strained relationship)   Primary caretaker for disabled brother who had aneurism   Daughter died 06/29/18   Social Drivers of Health   Financial Resource Strain: Patient Declined (03/31/2023)   Overall Financial Resource Strain (CARDIA)    Difficulty of Paying Living Expenses: Patient declined  Recent Concern: Financial Resource Strain - High Risk (01/02/2023)   Overall Financial Resource Strain (CARDIA)    Difficulty of Paying Living  Expenses: Very hard  Food Insecurity: Patient Declined (03/31/2023)   Hunger Vital Sign    Worried About Running Out of Food in the Last Year: Patient declined    Ran Out of Food in the Last Year: Patient declined  Transportation Needs: No Transportation Needs (03/31/2023)   PRAPARE - Administrator, Civil Service (Medical): No    Lack of Transportation (Non-Medical): No  Physical Activity: Inactive (03/31/2023)   Exercise Vital Sign    Days of Exercise per Week: 0 days    Minutes of Exercise per Session: 0 min  Stress: Stress Concern Present (03/31/2023)   [employer] of Occupational Health - Occupational Stress Questionnaire    Feeling of Stress : Very much  Social Connections: Moderately Integrated (03/31/2023)   Social Connection and Isolation Panel [NHANES]    Frequency of Communication with Friends and Family: More than three times a week    Frequency of  Social Gatherings with Friends and Family: More than three times a week    Attends Religious Services: More than 4 times per year    Active Member of [employer] or Organizations: No    Attends Banker Meetings: Never    Marital Status: Married  Catering manager Violence: Not At Risk (05/16/2022)   Humiliation, Afraid, Rape, and Kick questionnaire    Fear of Current or Ex-Partner: No    Emotionally Abused: No    Physically Abused: No    Sexually Abused: No    FAMILY HISTORY: Family History  Problem Relation Age of Onset   COPD Mother    Heart disease Mother    Lung disease Father        Asbestosis   Heart attack Father    Heart disease Father    Cerebral aneurysm Brother    Aneurysm Brother        Brain   Drug abuse Daughter    Epilepsy Son    Alcohol abuse Son    Drug abuse Son    Arthritis Maternal Grandmother    Heart disease Maternal Grandmother    Asthma Maternal Grandfather    Cancer Maternal Grandfather    Arthritis Paternal Grandmother    Heart disease Paternal Grandmother    Stroke Paternal Grandmother    Early death Paternal Grandfather    Heart disease Paternal Grandfather     ALLERGIES:  is allergic to metformin and related, nsaids, wellbutrin [bupropion], aleve [naproxen sodium], codeine, penicillins, and sulfonamide derivatives.  MEDICATIONS:  Current Outpatient Medications  Medication Sig Dispense Refill   albuterol (PROAIR HFA) 108 (90 Base) MCG/ACT inhaler 2 puffs every 4 hours as needed only  if your can't catch your breath 18 g 5   ALPRAZolam (XANAX) 1 MG tablet TAKE 1 TABLET BY MOUTH THREE TIMES DAILY AS NEEDED FOR ANXIETY 90 tablet 2   ARIPiprazole (ABILIFY) 5 MG tablet Take 1 tablet (5 mg total) by mouth daily. (Patient not taking: Reported on 03/27/2023) 30 tablet 5   atorvastatin (LIPITOR) 20 MG tablet TAKE 1 TABLET BY MOUTH AT BEDTIME 90 tablet 1   benzonatate (TESSALON) 100 MG capsule TAKE ONE CAPSULE BY MOUTH THREE TIMES DAILY 90  capsule 1   calcipotriene (DOVONOX) 0.005 % cream Apply to affected area bid when on break from steroid cream 120 g 3   cyclobenzaprine (FLEXERIL) 10 MG tablet Take 10 mg by mouth 3 (three) times daily as needed.     dicyclomine (BENTYL) 10 MG capsule TAKE ONE CAPSULE BY MOUTH EVERY  6 HOURS AS NEEDED FOR SPASMS 60 capsule 1   diphenoxylate-atropine (LOMOTIL) 2.5-0.025 MG tablet TAKE 1 TABLET BY MOUTH FOUR TIMES DAILY AS NEEDED FOR diarrhea OR loose stools 60 tablet 0   DULoxetine (CYMBALTA) 60 MG capsule TAKE TWO CAPSULES BY MOUTH EVERY DAY (Patient taking differently: Take 60 mg by mouth daily. Patient states that she adjusted her dose herself.) 180 capsule 1   FEROSUL 325 (65 Fe) MG tablet TAKE 1 TABLET BY MOUTH DAILY WITH BREAKFAST 30 tablet 3   fluticasone (FLONASE) 50 MCG/ACT nasal spray INSTILL 2 SPRAYS IN EACH NOSTRIL EVERY DAY 16 g 6   Fluticasone-Umeclidin-Vilant (TRELEGY ELLIPTA) 200-62.5-25 MCG/ACT AEPB Inhale 1 puff into the lungs daily. 60 each 11   furosemide (LASIX) 20 MG tablet TAKE 1 TABLET BY MOUTH TWICE DAILY, IF SWELLING IMPROVES CAN HOLD MEDICATION 90 tablet 0   HYDROcodone-acetaminophen (NORCO) 10-325 MG tablet TAKE 1 TABLET BY MOUTH EVERY 4 HOURS AS NEEDED 150 tablet 0   hydrOXYzine (ATARAX) 10 MG tablet TAKE 1 TABLET BY MOUTH THREE TIMES DAILY AS NEEDED FOR ITCHING 30 tablet 2   ketorolac (ACULAR) 0.5 % ophthalmic solution Place 1 drop into the left eye 4 (four) times daily.     levothyroxine (SYNTHROID) 137 MCG tablet Take 1 tablet (137 mcg total) by mouth daily before breakfast. 90 tablet 3   loratadine (CLARITIN) 10 MG tablet Take 10 mg by mouth daily.     meclizine (ANTIVERT) 12.5 MG tablet TAKE 1 TABLET BY MOUTH THREE TIMES DAILY AS NEEDED FOR DIZZINESS 30 tablet 1   meloxicam (MOBIC) 15 MG tablet TAKE 1 TABLET BY MOUTH EVERY DAY 30 tablet 2   OLANZapine (ZYPREXA) 10 MG tablet Take 1 tablet (10 mg total) by mouth at bedtime. 30 tablet 2   omeprazole (PRILOSEC) 40 MG  capsule TAKE ONE CAPSULE BY MOUTH TWICE DAILY 180 capsule 1   ondansetron (ZOFRAN) 8 MG tablet TAKE 1 TABLET BY MOUTH TWICE DAILY 60 tablet 1   permethrin (ELIMITE) 5 % cream      potassium chloride SA (KLOR-CON M) 20 MEQ tablet TAKE 1 TABLET BY MOUTH TWICE DAILY (Patient taking differently: Take 20 mEq by mouth daily. Patient states that she adjusted the dose herself.) 60 tablet 2   prochlorperazine (COMPAZINE) 10 MG tablet Take 1 tablet (10 mg total) by mouth every 6 (six) hours as needed for nausea or vomiting. 30 tablet 0   Rimegepant Sulfate (NURTEC) 75 MG TBDP Take 1 tablet (75 mg total) by mouth daily as needed (for migraine-). Take within 15 minutes of initial symptoms 8 tablet 5   topiramate (TOPAMAX) 100 MG tablet Take 1 tablet (100 mg total) by mouth at bedtime. Changed to 100 mg tabs- for ease 90 tablet 1   triamcinolone ointment (KENALOG) 0.5 % Apply 1 Application topically 3 (three) times daily. Apply thin-layer on affected areas, no more than 2 weeks consistently. 60 g 2   triamterene-hydrochlorothiazide (MAXZIDE-25) 37.5-25 MG tablet Take 1 tablet by mouth daily.     No current facility-administered medications for this visit.   Facility-Administered Medications Ordered in Other Visits  Medication Dose Route Frequency Provider Last Rate Last Admin   sodium chloride flush (NS) 0.9 % injection 10 mL  10 mL Intracatheter PRN Ulysees Barns IV, MD   10 mL at 04/10/23 1210    REVIEW OF SYSTEMS:   Constitutional: ( - ) fevers, ( - )  chills , ( - ) night sweats Eyes: ( - )  blurriness of vision, ( - ) double vision, ( - ) watery eyes Ears, nose, mouth, throat, and face: ( - ) mucositis, ( - ) sore throat Respiratory: ( - ) cough, ( -) dyspnea, ( - ) wheezes Cardiovascular: ( - ) palpitation, ( - ) chest discomfort, ( - ) lower extremity swelling Gastrointestinal:  ( +) nausea, ( - ) heartburn, ( - ) change in bowel habits Skin: ( - ) abnormal skin rashes Lymphatics: ( - ) new  lymphadenopathy, ( - ) easy bruising Neurological: (+ ) numbness, ( - ) tingling, ( - ) new weaknesses Behavioral/Psych: ( - ) mood change, ( - ) new changes  All other systems were reviewed with the patient and are negative.  PHYSICAL EXAMINATION: ECOG PERFORMANCE STATUS: 1 - Symptomatic but completely ambulatory  Vitals:   04/10/23 1125  BP: (!) 150/85  Pulse: 86  Resp: 16  Temp: 98.2 F (36.8 C)  SpO2: 95%    Filed Weights   04/10/23 1125  Weight: 174 lb 3.2 oz (79 kg)     GENERAL: Well-appearing middle-age Caucasian female, alert, no distress and comfortable SKIN: skin color, texture, turgor are normal, no rashes or significant lesions EYES: conjunctiva are pink and non-injected, sclera clear LUNGS:  normal breathing effort. Diffuse wheezing hear on ausculation.  HEART: regular rate & rhythm and no murmurs. Mild bilateral lower extremity edema Musculoskeletal: no cyanosis of digits and no clubbing  PSYCH: alert & oriented x 3, fluent speech NEURO: no focal motor/sensory deficits  LABORATORY DATA:  I have reviewed the data as listed    Latest Ref Rng & Units 04/10/2023   11:02 AM 03/27/2023    4:15 PM 02/26/2023   10:39 AM  CBC  WBC 4.0 - 10.5 K/uL 9.0  7.7  6.7   Hemoglobin 12.0 - 15.0 g/dL 01.0  27.2  53.6   Hematocrit 36.0 - 46.0 % 41.1  45.1  41.0   Platelets 150 - 400 K/uL 188  207.0  166        Latest Ref Rng & Units 04/10/2023   11:02 AM 03/27/2023    4:15 PM 02/26/2023   10:39 AM  CMP  Glucose 70 - 99 mg/dL 644  87  94   BUN 8 - 23 mg/dL 13  13  16    Creatinine 0.44 - 1.00 mg/dL 0.34  7.42  5.95   Sodium 135 - 145 mmol/L 128  134  137   Potassium 3.5 - 5.1 mmol/L 3.6  3.9  4.2   Chloride 98 - 111 mmol/L 95  97  104   CO2 22 - 32 mmol/L 28  31  26    Calcium 8.9 - 10.3 mg/dL 8.1  8.8  8.5   Total Protein 6.5 - 8.1 g/dL 6.5  6.5  6.0   Total Bilirubin 0.0 - 1.2 mg/dL 0.4  0.4  0.4   Alkaline Phos 38 - 126 U/L 66  65  65   AST 15 - 41 U/L 14  14  14     ALT 0 - 44 U/L 11  9  8      No results found for: "MPROTEIN" Lab Results  Component Value Date   KPAFRELGTCHN 0.75 06/30/2014   LAMBDASER 3.78 (H) 06/30/2014   KAPLAMBRATIO 0.20 (L) 06/30/2014     RADIOGRAPHIC STUDIES: DG SWALLOW FUNC OP MEDICARE SPEECH PATH Result Date: 03/28/2023 Table formatting from the original result was not included. Modified Barium Swallow Study Patient Details Name: Kendra Cristina  [name] MRN: [id number] Date of Birth: 04-07-1955 Today's Date: 03/28/2023 HPI/PMH: HPI: pt is a 68 yo female referred for OP MBS by Dr Everardo All due to concerns for aspiration.  Pt PMH + for COPD, stage IV lung cancer s/p chemoradiation and immunotherapy, depression, GERD, PUD, self reported sleep apnea, prior oxygen use at night *she stopped due to getting cord wrapped around her neck, constipation, fall 2024.  Pt has undergone prior endoscopy and was diagnosed previously with gastritis.  Med list includes topamax, trelegy, mobic, synthroid, abilify, prilosec, zofran, xanax prn, and compazine prn.  She states she has been on the same reflux medication for years and it manages her symptoms unless she eats [employer].  She uses goody powders 3-4 times daily for headache over the last 30 years and she continues to smoke.  CT chest 01/08/2023 showed no consolidation, pneumothorax or effusion, atypical pleural thickening, emphysematous lung changes.  No recurrence of lung cancer per imaging.  Pt reported symptoms appear consistent with primary esophageal deficits - as she senses esophaeal retention with po. She does not drink liquids with her meals.  States she will get "choked" approx 3-4 times a week at random - occurs with both solids and liquids.  States she choked on a piece of ham approx 2 years ago - but did not inform her MD.  She does endorse hoarseness in am at times. States dysphagia symptoms started approx 2 years ago. Denies any specific neurological diagnosis.  Denies pneumonias nor unintentional  weight loss. Clinical Impression: Clinical Impression: Patient presents with minimal pharyngeal dysphagia resulting in consistent mild laryngeal penetration of thin liquid when consuming larger than a tiny sip.  Trace silent aspiration noted once as barium peaked just below the vocal cords.  Cued coughing cleared all penetrates and trace aspirates.  Laryngeal closure timing and adequacy slightly impaired.  Very small single sips were tolerated without penetration.  Chin tuck posture was not helpful as it continued to allow penetration.   Swallow with solid, pure and honey thick liquid was normal.  Patient notably tossed her head back to aid transit of barium tablet with thin barium resulting in tablet spilling to vallecular space and deep penetration of thin liquid.  Advised patient consider taking meds with Ensure, tomato juice, slightly thicker liquid or applesauce.  Given patient reports discomfort when coughing and choking during meals advised that she monitor her when her dysphagia symptoms are occurring and take copious notes to provide detailed information to her physician about this issue.  SLP did not observe slight aspiration until following the MBS therefore also recommended patient consider reclining in chair approximately 45 degrees with thin liquids to see if this would retain boluses in posterior pharynx to prevent aspiration.  Further recommend consider consuming slightly thicker liquids during her meals and saving her thin water for between her meals if improves comfort.  Endorsed importance however of patient maintaining her hydration with understanding of goal to maximize her comfort with intake.  Thank you so much for this consult of this most pleasant patient. Factors that may increase risk of adverse event in presence of aspiration Rubye Oaks & Clearance Coots 2021): No data recorded Recommendations/Plan: Swallowing Evaluation Recommendations Swallowing Evaluation Recommendations Recommendations: PO diet  PO Diet Recommendation: Regular; Thin liquids (Level 0) (Consider slightly thicker liquids with minimal if improves comfort.) Liquid Administration via: Cup; Straw Medication Administration: Whole meds with liquid Supervision: Patient able to self-feed Swallowing strategies  : Slow rate; Small bites/sips (Intermittent throat clear and swallow) Postural  changes: -- (Consider slightly reclined if improves comfort decreases coughing with thin liquid) Oral care recommendations: Oral care BID (2x/day) Treatment Plan Treatment Plan Follow-up recommendations: No SLP follow up Recommendations Recommendations for follow up therapy are one component of a multi-disciplinary discharge planning process, led by the attending physician.  Recommendations may be updated based on patient status, additional functional criteria and insurance authorization. Assessment: Orofacial Exam: Orofacial Exam Oral Cavity: Oral Hygiene: WFL Oral Cavity - Dentition: Dentures, top; Dentures, bottom Orofacial Anatomy: WFL Oral Motor/Sensory Function: WFL Anatomy: Anatomy: WFL Boluses Administered: Boluses Administered Boluses Administered: Thin liquids (Level 0); Mildly thick liquids (Level 2, nectar thick); Moderately thick liquids (Level 3, honey thick); Puree; Solid  Oral Impairment Domain: Oral Impairment Domain Lip Closure: No labial escape Tongue control during bolus hold: Cohesive bolus between tongue to palatal seal Bolus preparation/mastication: Timely and efficient chewing and mashing Bolus transport/lingual motion: Brisk tongue motion Oral residue: Trace residue lining oral structures Location of oral residue : Tongue Initiation of pharyngeal swallow : Pyriform sinuses  Pharyngeal Impairment Domain: Pharyngeal Impairment Domain Soft palate elevation: No bolus between soft palate (SP)/pharyngeal wall (PW) Laryngeal elevation: Partial superior movement of thyroid cartilage/partial approximation of arytenoids to epiglottic petiole Anterior  hyoid excursion: Complete anterior movement Epiglottic movement: Complete inversion Laryngeal vestibule closure: Incomplete, narrow column air/contrast in laryngeal vestibule Pharyngeal stripping wave : Present - complete Pharyngeal contraction (A/P view only): N/A Pharyngoesophageal segment opening: Complete distension and complete duration, no obstruction of flow Tongue base retraction: No contrast between tongue base and posterior pharyngeal wall (PPW) Pharyngeal residue: Complete pharyngeal clearance Location of pharyngeal residue: N/A  Esophageal Impairment Domain: Esophageal Impairment Domain Esophageal clearance upright position: Complete clearance, esophageal coating Pill: Pill Consistency administered: Thin liquids (Level 0) Thin liquids (Level 0): Impaired (see clinical impressions) Penetration/Aspiration Scale Score: Penetration/Aspiration Scale Score 1.  Material does not enter airway: Moderately thick liquids (Level 3, honey thick); Puree; Solid; Pill 3.  Material enters airway, remains ABOVE vocal cords and not ejected out: Mildly thick liquids (Level 2, nectar thick) 8.  Material enters airway, passes BELOW cords without attempt by patient to eject out (silent aspiration) : Thin liquids (Level 0) Compensatory Strategies: Compensatory Strategies Compensatory strategies: Yes Chin tuck: Ineffective Ineffective Chin Tuck: Thin liquid (Level 0) Other(comment): Effective (very small boluses) Effective Other(comment): Thin liquid (Level 0)   General Information: Caregiver present: No  Diet Prior to this Study: Regular; Thin liquids (Level 0)   Temperature : Normal   Respiratory Status: WFL   Supplemental O2: None (Room air)   History of Recent Intubation: No  Behavior/Cognition: Alert; Cooperative; Pleasant mood Self-Feeding Abilities: Able to self-feed Baseline vocal quality/speech: Normal Volitional Cough: Able to elicit Volitional Swallow: Able to elicit Exam Limitations: No limitations Goal Planning: No  data recorded No data recorded No data recorded No data recorded No data recorded Pain: No data recorded End of Session: Start Time:SLP Start Time (ACUTE ONLY): 1309 Stop Time: SLP Stop Time (ACUTE ONLY): 1355 Time Calculation:SLP Time Calculation (min) (ACUTE ONLY): 46 min Charges: SLP Evaluations $ SLP Speech Visit: 1 Visit SLP Evaluations $Outpatient MBS Swallow: 1 Procedure $Swallowing Treatment: 1 Procedure SLP visit diagnosis: SLP Visit Diagnosis: Dysphagia, pharyngeal phase (R13.13) Past Medical History: Past Medical History: Diagnosis Date  Allergic rhinitis   Anemia   Anxiety   Chicken pox   Chronic back pain   COPD (chronic obstructive pulmonary disease) (HCC)   Depression   DM (diabetes mellitus) (HCC)   Essential hypertension  GERD (gastroesophageal reflux disease)   Headache   migraines  History of gastritis   EGD 2015  History of home oxygen therapy   2 liters at hs last 6 months  Hyperlipidemia   Hypothyroidism   Migraines   Osteoarthritis   oa  Scoliosis   Small cell lung cancer (HCC)   Stage 4 Past Surgical History: Past Surgical History: Procedure Laterality Date  APPENDECTOMY    1985  BIOPSY  07/24/2017  Procedure: BIOPSY;  Surgeon: Rachael Fee, MD;  Location: [facility];  Service: Endoscopy;;  CARDIAC CATHETERIZATION N/A 10/31/2015  Procedure: Left Heart Cath and Coronary Angiography;  Surgeon: Marykay Lex, MD;  Location: [facility] INVASIVE CV LAB;  Service: Cardiovascular;  Laterality: N/A;  CARPAL TUNNEL RELEASE Left   CARPAL TUNNEL RELEASE Right   CATARACT EXTRACTION W/PHACO Left 02/07/2023  Procedure: CATARACT EXTRACTION PHACO AND INTRAOCULAR LENS PLACEMENT (IOC);  Surgeon: Pecolia Ades, MD;  Location: [facility];  Service: Ophthalmology;  Laterality: Left;  CDE: 3.51  CATARACT EXTRACTION W/PHACO Right 02/21/2023  Procedure: CATARACT EXTRACTION PHACO AND INTRAOCULAR LENS PLACEMENT (IOC);  Surgeon: Pecolia Ades, MD;  Location: [facility];  Service: Ophthalmology;  Laterality: Right;  CDE:  2.94  CHOLECYSTECTOMY  late 1980's  COLONOSCOPY WITH PROPOFOL N/A 07/24/2017  Procedure: COLONOSCOPY WITH PROPOFOL;  Surgeon: Rachael Fee, MD;  Location: [facility];  Service: Endoscopy;  Laterality: N/A;  ESOPHAGOGASTRODUODENOSCOPY N/A 07/24/2017  Procedure: ESOPHAGOGASTRODUODENOSCOPY (EGD);  Surgeon: Rachael Fee, MD;  Location: [facility];  Service: Endoscopy;  Laterality: N/A;  ESOPHAGOGASTRODUODENOSCOPY (EGD) WITH PROPOFOL N/A 10/19/2020  Procedure: ESOPHAGOGASTRODUODENOSCOPY (EGD) WITH PROPOFOL;  Surgeon: Rachael Fee, MD;  Location: [facility];  Service: Endoscopy;  Laterality: N/A;  FINE NEEDLE ASPIRATION N/A 10/19/2020  Procedure: FINE NEEDLE ASPIRATION (FNA) LINEAR;  Surgeon: Rachael Fee, MD;  Location: [facility];  Service: Endoscopy;  Laterality: N/A;  GALLBLADDER SURGERY  1991  HIP CLOSED REDUCTION Right 01/08/2016  Procedure: CLOSED MANIPULATION HIP;  Surgeon: Jene Every, MD;  Location: [facility];  Service: Orthopedics;  Laterality: Right;  HIP CLOSED REDUCTION Right 01/19/2016  Procedure: ATTEMPTED CLOSED REDUCTION RIGHT HIP;  Surgeon: Toni Arthurs, MD;  Location: [facility];  Service: Orthopedics;  Laterality: Right;  HIP CLOSED REDUCTION Right 01/20/2016  Procedure: CLOSED REDUCTION RIGHT TOTAL HIP;  Surgeon: Durene Romans, MD;  Location: [facility];  Service: Orthopedics;  Laterality: Right;  HIP CLOSED REDUCTION Right 02/17/2016  Procedure: CLOSED REDUCTION RIGHT TOTAL HIP;  Surgeon: Samson Frederic, MD;  Location: [facility];  Service: Orthopedics;  Laterality: Right;  HIP CLOSED REDUCTION Right 02/28/2016  Procedure: CLOSED REDUCTION HIP;  Surgeon: Yolonda Kida, MD;  Location: [facility];  Service: Orthopedics;  Laterality: Right;  IR IMAGING GUIDED PORT INSERTION  11/01/2020  POLYPECTOMY  07/24/2017  Procedure: POLYPECTOMY;  Surgeon: Rachael Fee, MD;  Location: [facility];  Service: Endoscopy;;  TONSILLECTOMY    TOTAL ABDOMINAL HYSTERECTOMY    1985, with 1 ovary removed and 2 nd  ovary removed 2003  TOTAL HIP ARTHROPLASTY Right   Original surgery 2006 with revision 2010  TOTAL HIP REVISION Right 01/01/2016  Procedure: TOTAL HIP REVISION;  Surgeon: Durene Romans, MD;  Location: [facility];  Service: Orthopedics;  Laterality: Right;  TOTAL HIP REVISION Right 03/02/2016  Procedure: TOTAL HIP REVISION;  Surgeon: Durene Romans, MD;  Location: [facility];  Service: Orthopedics;  Laterality: Right;  TOTAL HIP REVISION Right 09/02/2016  Procedure: Right hip constrained liner- posterior;  Surgeon: Durene Romans, MD;  Location: [facility];  Service: Orthopedics;  Laterality: Right;  ULNAR NERVE TRANSPOSITION Right   UPPER ESOPHAGEAL ENDOSCOPIC ULTRASOUND (EUS) N/A 10/19/2020  Procedure: UPPER ESOPHAGEAL ENDOSCOPIC ULTRASOUND (EUS);  Surgeon: Rachael Fee, MD;  Location: [facility];  Service: Endoscopy;  Laterality: N/A;  periduodenal lesion Rolena Infante, MS [facility] SLP Acute Rehab Services Office [phone] Chales Abrahams 03/28/2023, 7:14 PM CLINICAL DATA:  Dysphagia. Cough, lung cancer EXAM: MODIFIED BARIUM SWALLOW TECHNIQUE: Different consistencies of barium were administered orally to the patient by the Speech Pathologist. Imaging of the pharynx was performed in the lateral projection. The radiologist was present in the fluoroscopy room for this study, providing personal supervision. FLUOROSCOPY: Radiation Exposure Index (as provided by the fluoroscopic device): 6.6 mGy Kerma COMPARISON:  None Available. FINDINGS: Vestibular  Penetration:  Flash penetration. Aspiration:  None seen. Other:  None. IMPRESSION: See speech pathology report. Please refer to the Speech Pathologists report for complete details and recommendations. Electronically Signed   By: Genevive Bi M.D.   On: 03/28/2023 15:12    ASSESSMENT & PLAN [name] 68 y.o. female with medical history significant for extensive stage small cell lung cancer who presents for a follow up visit.   After review of the labs, review of the records,  and discussion with the patient the patients findings are most consistent with extensive stage small cell lung cancer with metastasis from the right lower lobe to the lymph nodes of the abdomen.  At this time we will pursue triple therapy with carboplatin, etoposide, and atezolizumab.  After 4 cycles we will convert to maintenance atezolizumab alone.  We previously discussed the risks and benefits of this therapy and the patient was in agreement to proceed with this treatment.  The treatment of choice consist of carboplatin, etoposide, and atezolizumab.  The regimen consists of carboplatin AUC of 5 IV on day 1, etoposide 100 mg per metered squared IV on day 1, 2, and 3 and atezolizumab 1200 mg on day 1.  This continues for 21-day cycles.  After 4 cycles the patient proceeds with atezolizumab maintenance therapy alone.    # Small Cell Lung Cancer, Extensive Stage -- MRI of the brain shows no evidence of intracranial spread --Findings are currently consistent with metastatic small cell lung cancer with metastatic spread to the lymph nodes of the abdomen -- Started carboplatin, etoposide, and atezolizumab on 11/13/2020. Transitioned to maintenance Atezolizumab on 02/22/2021.  Plan: --today is Cycle 37 Day 1 of Atezolizumab maintenance.  --labs today were reviewed and adequate for treatment. Hgb 14.3, WBC 9.0, MCV 89.3, Plt 188. Creatinine stable at 1.12. LFTs in range. Thyroid panel normal. -- Most recent CT CAP from 01/06/2023 which shows no evidence of recurrence. Repeat scan due in March 2025.  Ordered today.  --Proceed with treatment without any dose modifications.  --RTC in 3 weeks for a follow up before Cycle 38  # Early Cirrhosis --noted on MR abdomen from 05/22/2022. [place] with elastography performed on 06/18/2022. --continue to follow with GI.   #Right renal lesion: --Noted to be benign on most recent MRI of the abdomen.  #Diarrhea-improved --stool sent for C. Diff and GI pathology panel, all  negative --patient following with GI and found to have pancreatic insufficiency and started on creon with improvement of symptoms.   #Nausea: --Symptoms improved with zofran and olanzapine.  --Added IV zofran to infusion premeds for better symptom control.   #Hypokalemia:  --currently taking potassium chloride 20 meq BID.   --potassium level is 3.6 today.  Continue PO supplementation.   #Lower extremity edema --Currently takes lasix 20 mg 1-2 times a day. --Stable. Continue to monitor.   #Neuropathy involving fingers/feet: --Patient currently takes Cymbalta   #Supportive Care -- chemotherapy education complete -- port placed -- zofran 8mg  q8H PRN and compazine 10mg  PO q6H for nausea -- EMLA cream for port  Orders Placed This Encounter  Procedures   CT CHEST ABDOMEN PELVIS W CONTRAST    Standing Status:   Future    Expected Date:   04/17/2023    Expiration Date:   04/09/2024    If indicated for the ordered procedure, I authorize the administration of contrast media per Radiology protocol:   Yes    Does the patient have a contrast media/X-ray dye allergy?:   No    Preferred imaging location?:   [facility]    If indicated for the ordered procedure, I authorize the administration of oral contrast media per Radiology protocol:   Yes   All questions were answered. The patient knows to call the clinic with any problems, questions or concerns.  I have spent a total of 30 minutes minutes of face-to-face and non-face-to-face time, preparing to see the patient, performing a medically appropriate examination, counseling and educating the patient, ordering medications/tests, documenting clinical information in the electronic health record, and care coordination.   Ulysees Barns, MD Department of Hematology/Oncology [facility] Phone: [phone] Pager: [phone] Email: [email or web address]    04/10/2023 3:43 PM

## 2023-04-11 ENCOUNTER — Ambulatory Visit: Payer: Self-pay | Admitting: Physician Assistant

## 2023-04-11 LAB — T4: T4, Total: 10.5 ug/dL (ref 4.5–12.0)

## 2023-04-11 NOTE — Telephone Encounter (Signed)
  Chief Complaint: mouth pain Symptoms: mouth pain, bumps on tongue Frequency: 3 days Pertinent Negatives: Patient denies fever, sore throat Disposition: [] ED /[] Urgent Care (no appt availability in office) / [] Appointment(In office/virtual)/ []  [place] [facility]/ [] Home Care/ [] Refused Recommended Disposition /[] [place] [facility]/ [x]  Follow-up with PCP Additional Notes: Patient reports she has been experiencing mouth pain and small bumps on her tongue for the last 3 days. Patient reports she does get chemo treatments, and her last one was yesterday. Patient reports she has had this happen in the past and it was due to her not rinsing her inhaler between uses. Patient states that pain in her mouth does make it difficult to swallow. Patient reports she does not want to be seen in office due to getting chemo yesterday. Patient requesting order for mouth rinse that was prescribed last time as she states that helped. Advised patient would forward request to appropriate person for follow-up. Advised patient to call back with worsening symptoms. Patient verbalized understanding.     Copied from CRM [id number]. Topic: Clinical - Red Word Triage >> Apr 11, 2023  9:22 AM Fonda Kinder J wrote: [facility] that prompted transfer to Nurse Triage: Pt states there is residue from her inhaler on her tongue that is severely pain and sore, she is unable to eat or drink comfortably and it has spread to her lips because of dehydration from not drinking/eating Reason for Disposition  Receiving chemotherapy or radiation therapy  Answer Assessment - Initial Assessment Questions 1. ONSET: "When did the mouth start hurting?" (e.g., hours or days ago)      3 days 2. SEVERITY: "How bad is the pain?" (Scale 1-10; mild, moderate or severe)   - MILD (1-3):  doesn't interfere with eating or normal activities   - MODERATE (4-7): interferes with eating some solids and normal activities   - SEVERE (8-10):  excruciating pain,  interferes with most normal activities   - SEVERE DYSPHAGIA: can't swallow liquids, drooling     moderate 3. SORES: "Are there any sores or ulcers in the mouth?" If Yes, ask: "What part of the mouth are the sores in?"     Bumps on tongue 4. FEVER: "Do you have a fever?" If Yes, ask: "What is your temperature, how was it measured, and when did it start?"     none 5. CAUSE: "What do you think is causing the mouth pain?"     I think from not cleaning my inhaler between uses 6. OTHER SYMPTOMS: "Do you have any other symptoms?" (e.g., difficulty breathing)     none  Protocols used: Mouth Pain-A-AH

## 2023-04-11 NOTE — Telephone Encounter (Signed)
Please see triage note as FYI

## 2023-04-12 DIAGNOSIS — B370 Candidal stomatitis: Secondary | ICD-10-CM | POA: Diagnosis not present

## 2023-04-14 NOTE — Telephone Encounter (Signed)
 Please contact patient to schedule OV or VV per PCP

## 2023-04-14 NOTE — Telephone Encounter (Signed)
 Please see update and advise if appt is still needed

## 2023-04-17 ENCOUNTER — Ambulatory Visit (HOSPITAL_COMMUNITY)
Admission: RE | Admit: 2023-04-17 | Discharge: 2023-04-17 | Disposition: A | Discharge status: Home / Self Care | Source: Ambulatory Visit | Attending: Physician Assistant | Admitting: Physician Assistant

## 2023-04-17 ENCOUNTER — Ambulatory Visit (HOSPITAL_COMMUNITY)
Admission: RE | Admit: 2023-04-17 | Discharge: 2023-04-17 | Disposition: A | Discharge status: Home / Self Care | Source: Ambulatory Visit | Attending: Hematology and Oncology | Admitting: Hematology and Oncology

## 2023-04-17 DIAGNOSIS — R0781 Pleurodynia: Secondary | ICD-10-CM

## 2023-04-17 DIAGNOSIS — K5730 Diverticulosis of large intestine without perforation or abscess without bleeding: Secondary | ICD-10-CM | POA: Diagnosis not present

## 2023-04-17 DIAGNOSIS — C3431 Malignant neoplasm of lower lobe, right bronchus or lung: Secondary | ICD-10-CM | POA: Diagnosis not present

## 2023-04-17 DIAGNOSIS — Z9049 Acquired absence of other specified parts of digestive tract: Secondary | ICD-10-CM | POA: Diagnosis not present

## 2023-04-17 DIAGNOSIS — S2232XA Fracture of one rib, left side, initial encounter for closed fracture: Secondary | ICD-10-CM | POA: Diagnosis not present

## 2023-04-17 MED ORDER — SODIUM CHLORIDE (PF) 0.9 % IJ SOLN
INTRAMUSCULAR | Status: AC
  Filled 2023-04-17: qty 50

## 2023-04-17 MED ORDER — IOHEXOL 300 MG/ML  SOLN
100.0000 mL | Freq: Once | INTRAMUSCULAR | Status: AC | PRN
Start: 2023-04-17 — End: 2023-04-17
  Administered 2023-04-17: 100 mL via INTRAVENOUS

## 2023-04-28 ENCOUNTER — Other Ambulatory Visit: Payer: Self-pay | Admitting: Gastroenterology

## 2023-04-29 NOTE — Progress Notes (Unsigned)
 [facility] [phone]) [phone]   [phone]  PROGRESS NOTE  Patient Care Team: Allwardt, Crist Infante, PA-C as PCP - General (Physician Assistant) O'Neal, Ronnald Ramp, MD as PCP - Cardiology (Cardiology) Rachael Fee, MD (Inactive) as Attending Physician (Gastroenterology) Dr. Louie Bun, MD as Consulting Physician (Orthopedic Surgery) Ranee Gosselin, MD as Consulting Physician (Orthopedic Surgery) York Spaniel, MD (Inactive) as Consulting Physician (Neurology) Van Clines, MD as Consulting Physician (Neurology) Erroll Luna, [facility] (Inactive) (Pharmacist) Pll[facility], [facility]  Hematological/Oncological History # Small Cell Lung Cancer, Extensive Stage 07/05/2020: CT abdomen for lower abdominal pain. New pulmonary nodular density noted 07/06/2020: CT chest showed 2.2 cm macrolobulated right lower lobe pulmonary nodule (favored) versus pathologically enlarged infrahilar lymph node 10/13/2020: PET CT scan performed, findings show 2 cm right lower lobe lung mass is hypermetabolic and consistent with primary lung neoplasm. Additionally found hypermetabolic 17 mm soft tissue lesion between the descending duodenum and the pancreatic head  10/19/2020: EGD to biopsy hypermetabolic lymph node. Biopsy results show small cell lung cancer 10/26/2020: establish care with Dr. Leonides Schanz  11/13/2020: Cycle 1 Day 1 of Carbo/Etop/Atezolizumab 12/04/2020: Cycle 2 Day 1 of Carbo/Etop/Atezolizumab 11/22-11/25/2022: admitted for E. Coli bacteremia/sepsis. Start of Cycle 3 delayed. 01/02/2021: Cycle 3 Day 1 of Carbo/Etop/Atezolizumab 01/23/2021: Cycle 4 Day 1 of Carbo/Etop/Atezolizumab 02/22/2021: Cycle 5 Day 1 of Atezolizumab Maintenance. Delayed start due to patient's COVID infection.  03/14/2021: Cycle 6 Day 1 of Atezolizumab Maintenance 04/05/2021: Cycle 7 Day 1 of Atezolizumab Maintenance 04/25/2021: Cycle 8 Day 1 of Atezolizumab  Maintenance 05/16/2021: Cycle 9 Day 1 of Atezolizumab Maintenance 06/06/2021: Cycle 10 Day 1 of Atezolizumab Maintenance 06/27/2021:  Cycle 11 Day 1 of Atezolizumab Maintenance 07/18/2021: Cycle 12 Day 1 of Atezolizumab Maintenance 08/08/2021: Cycle 13 Day 1 of Atezolizumab Maintenance 08/29/2021: Cycle 14 Day 1 of Atezolizumab Maintenance 09/19/2021: Cycle 15 Day 1 of Atezolizumab Maintenance 10/10/2021: Cycle 16 Day 1 of Atezolizumab Maintenance 10/31/2021: Cycle 17 Day 1 of Atezolizumab Maintenance 11/21/2021: Cycle 18 Day 1 of Atezolizumab Maintenance 12/12/2021: Cycle 19 Day 1 of Atezolizumab Maintenance 01/02/2022: Cycle 20 Day 1 of Atezolizumab Maintenance 01/23/2022: Cycle 21 Day 1 of Atezolizumab Maintenance 02/13/2022: treatment HELD due to diarrhea.  03/06/2022: Cycle 22 Day 1 of Atezolizumab Maintenance 03/27/2022: Cycle 23 Day 1 of Atezolizumab Maintenance 04/17/2022: Cycle 24 Day 1 of Atezolizumab Maintenance 05/08/2022:  Cycle 25 Day 1 of Atezolizumab Maintenance (Held due to patient preference for treatment holiday) 05/29/2022: Cycle 25 Day 1 of Atezolizumab Maintenance  06/19/2022: Cycle 26 Day 1 of Atezolizumab Maintenance  07/12/2022: Cycle 27 Day 1 of Atezolizumab Maintenance 07/31/2022: Cycle 28 Day 1 of Atezolizumab Maintenance HELD per patient request for fatigue.  08/22/2022: Cycle 28 Day 1 of Atezolizumab Maintenance 09/11/2022: Cycle 29 Day 1 of Atezolizumab Maintenance 10/03/2022: Cycle 30 Day 1 of Atezolizumab Maintenance 10/23/2022: Cycle 31 Day 1 of Atezolizumab Maintenance 11/13/2022: Cycle 32 Day 1 of Atezolizumab Maintenance 12/24/2022: Cycle 33 Day 1 of Atezolizumab Maintenance 01/15/2023: Cycle 34 Day 1 of Atezolizumab Maintenance 02/05/2023: Cycle 35 Day 1 of Atezolizumab Maintenance 02/26/2023: Cycle 36 Day 1 of Atezolizumab Maintenance  Interval History:  [name] 68 y.o. female with medical history significant for extensive stage small cell lung cancer who  presents for a follow up visit. The patient's last visit was on 04/10/2023.  She presents today to start cycle 39 of chemotherapy.   On exam today Mrs. Mchatton reports ***.  She reports that otherwise  she remains at her baseline level of health and is willing and able to proceed with atezolizumab therapy at this time.  She denies fevers, chills, sweats, chest pain or cough. She has no other complaints.A full 10 point ROS is listed below.   MEDICAL HISTORY:  Past Medical History:  Diagnosis Date   Allergic rhinitis    Anemia    Anxiety    Chicken pox    Chronic back pain    COPD (chronic obstructive pulmonary disease) (HCC)    Depression    DM (diabetes mellitus) (HCC)    Essential hypertension    GERD (gastroesophageal reflux disease)    Headache    migraines   History of gastritis    EGD 2015   History of home oxygen therapy    2 liters at hs last 6 months   Hyperlipidemia    Hypothyroidism    Migraines    Osteoarthritis    oa   Scoliosis    Small cell lung cancer (HCC)    Stage 4    SURGICAL HISTORY: Past Surgical History:  Procedure Laterality Date   APPENDECTOMY     1985   BIOPSY  07/24/2017   Procedure: BIOPSY;  Surgeon: Rachael Fee, MD;  Location: [facility];  Service: Endoscopy;;   CARDIAC CATHETERIZATION N/A 10/31/2015   Procedure: Left Heart Cath and Coronary Angiography;  Surgeon: Marykay Lex, MD;  Location: [facility] INVASIVE CV LAB;  Service: Cardiovascular;  Laterality: N/A;   CARPAL TUNNEL RELEASE Left    CARPAL TUNNEL RELEASE Right    CATARACT EXTRACTION W/PHACO Left 02/07/2023   Procedure: CATARACT EXTRACTION PHACO AND INTRAOCULAR LENS PLACEMENT (IOC);  Surgeon: Pecolia Ades, MD;  Location: [facility];  Service: Ophthalmology;  Laterality: Left;  CDE: 3.51   CATARACT EXTRACTION W/PHACO Right 02/21/2023   Procedure: CATARACT EXTRACTION PHACO AND INTRAOCULAR LENS PLACEMENT (IOC);  Surgeon: Pecolia Ades, MD;  Location: [facility];  Service: Ophthalmology;   Laterality: Right;  CDE: 2.94   CHOLECYSTECTOMY  late 1980's   COLONOSCOPY WITH PROPOFOL N/A 07/24/2017   Procedure: COLONOSCOPY WITH PROPOFOL;  Surgeon: Rachael Fee, MD;  Location: [facility];  Service: Endoscopy;  Laterality: N/A;   ESOPHAGOGASTRODUODENOSCOPY N/A 07/24/2017   Procedure: ESOPHAGOGASTRODUODENOSCOPY (EGD);  Surgeon: Rachael Fee, MD;  Location: [facility];  Service: Endoscopy;  Laterality: N/A;   ESOPHAGOGASTRODUODENOSCOPY (EGD) WITH PROPOFOL N/A 10/19/2020   Procedure: ESOPHAGOGASTRODUODENOSCOPY (EGD) WITH PROPOFOL;  Surgeon: Rachael Fee, MD;  Location: [facility];  Service: Endoscopy;  Laterality: N/A;   FINE NEEDLE ASPIRATION N/A 10/19/2020   Procedure: FINE NEEDLE ASPIRATION (FNA) LINEAR;  Surgeon: Rachael Fee, MD;  Location: [facility];  Service: Endoscopy;  Laterality: N/A;   GALLBLADDER SURGERY  1991   HIP CLOSED REDUCTION Right 01/08/2016   Procedure: CLOSED MANIPULATION HIP;  Surgeon: Jene Every, MD;  Location: [facility];  Service: Orthopedics;  Laterality: Right;   HIP CLOSED REDUCTION Right 01/19/2016   Procedure: ATTEMPTED CLOSED REDUCTION RIGHT HIP;  Surgeon: Toni Arthurs, MD;  Location: [facility];  Service: Orthopedics;  Laterality: Right;   HIP CLOSED REDUCTION Right 01/20/2016   Procedure: CLOSED REDUCTION RIGHT TOTAL HIP;  Surgeon: Durene Romans, MD;  Location: [facility];  Service: Orthopedics;  Laterality: Right;   HIP CLOSED REDUCTION Right 02/17/2016   Procedure: CLOSED REDUCTION RIGHT TOTAL HIP;  Surgeon: Samson Frederic, MD;  Location: [facility];  Service: Orthopedics;  Laterality: Right;   HIP CLOSED REDUCTION Right 02/28/2016   Procedure: CLOSED REDUCTION  HIP;  Surgeon: Yolonda Kida, MD;  Location: [facility];  Service: Orthopedics;  Laterality: Right;   IR IMAGING GUIDED PORT INSERTION  11/01/2020   POLYPECTOMY  07/24/2017   Procedure: POLYPECTOMY;  Surgeon: Rachael Fee, MD;  Location: [facility];  Service: Endoscopy;;   TONSILLECTOMY      TOTAL ABDOMINAL HYSTERECTOMY     1985, with 1 ovary removed and 2 nd ovary removed 2003   TOTAL HIP ARTHROPLASTY Right    Original surgery 2006 with revision 2010   TOTAL HIP REVISION Right 01/01/2016   Procedure: TOTAL HIP REVISION;  Surgeon: Durene Romans, MD;  Location: [facility];  Service: Orthopedics;  Laterality: Right;   TOTAL HIP REVISION Right 03/02/2016   Procedure: TOTAL HIP REVISION;  Surgeon: Durene Romans, MD;  Location: [facility];  Service: Orthopedics;  Laterality: Right;   TOTAL HIP REVISION Right 09/02/2016   Procedure: Right hip constrained liner- posterior;  Surgeon: Durene Romans, MD;  Location: [facility];  Service: Orthopedics;  Laterality: Right;   ULNAR NERVE TRANSPOSITION Right    UPPER ESOPHAGEAL ENDOSCOPIC ULTRASOUND (EUS) N/A 10/19/2020   Procedure: UPPER ESOPHAGEAL ENDOSCOPIC ULTRASOUND (EUS);  Surgeon: Rachael Fee, MD;  Location: [facility];  Service: Endoscopy;  Laterality: N/A;  periduodenal lesion    SOCIAL HISTORY: Social History   Socioeconomic History   Marital status: Married    Spouse name: Not on file   Number of children: 2   Years of education: Not on file   Highest education level: 12th grade  Occupational History   Occupation: disabled   Occupation: disabled  Tobacco Use   Smoking status: Every Day    Current packs/day: 2.00    Average packs/day: 2.0 packs/day for 46.0 years (92.0 ttl pk-yrs)    Types: Cigarettes   Smokeless tobacco: Never   Tobacco comments:    2 packs of cigarettes smoked daily 12/13/21- declines smoking cessation  Vaping Use   Vaping status: Never Used  Substance and Sexual Activity   Alcohol use: No   Drug use: No   Sexual activity: Not Currently    Partners: Male  Other Topics Concern   Not on file  Social History Narrative   Right handed    Caffeine~ 2 cups per day    Lives at home with husband (strained relationship)   Primary caretaker for disabled brother who had aneurism   Daughter died 06-11-2018    Social Drivers of Health   Financial Resource Strain: Patient Declined (03/31/2023)   Overall Financial Resource Strain (CARDIA)    Difficulty of Paying Living Expenses: Patient declined  Recent Concern: Physicist, medical Strain - High Risk (01/02/2023)   Overall Financial Resource Strain (CARDIA)    Difficulty of Paying Living Expenses: Very hard  Food Insecurity: Patient Declined (03/31/2023)   Hunger Vital Sign    Worried About Running Out of Food in the Last Year: Patient declined    Ran Out of Food in the Last Year: Patient declined  Transportation Needs: No Transportation Needs (03/31/2023)   PRAPARE - Administrator, Civil Service (Medical): No    Lack of Transportation (Non-Medical): No  Physical Activity: Inactive (03/31/2023)   Exercise Vital Sign    Days of Exercise per Week: 0 days    Minutes of Exercise per Session: 0 min  Stress: Stress Concern Present (03/31/2023)   [employer] of Occupational Health - Occupational Stress Questionnaire    Feeling of Stress : Very much  Social  Connections: Moderately Integrated (03/31/2023)   Social Connection and Isolation Panel [NHANES]    Frequency of Communication with Friends and Family: More than three times a week    Frequency of Social Gatherings with Friends and Family: More than three times a week    Attends Religious Services: More than 4 times per year    Active Member of [employer] or Organizations: No    Attends Banker Meetings: Never    Marital Status: Married  Catering manager Violence: Not At Risk (05/16/2022)   Humiliation, Afraid, Rape, and Kick questionnaire    Fear of Current or Ex-Partner: No    Emotionally Abused: No    Physically Abused: No    Sexually Abused: No    FAMILY HISTORY: Family History  Problem Relation Age of Onset   COPD Mother    Heart disease Mother    Lung disease Father        Asbestosis   Heart attack Father    Heart disease Father    Cerebral aneurysm Brother     Aneurysm Brother        Brain   Drug abuse Daughter    Epilepsy Son    Alcohol abuse Son    Drug abuse Son    Arthritis Maternal Grandmother    Heart disease Maternal Grandmother    Asthma Maternal Grandfather    Cancer Maternal Grandfather    Arthritis Paternal Grandmother    Heart disease Paternal Grandmother    Stroke Paternal Grandmother    Early death Paternal Grandfather    Heart disease Paternal Grandfather     ALLERGIES:  is allergic to metformin and related, nsaids, wellbutrin [bupropion], aleve [naproxen sodium], codeine, penicillins, and sulfonamide derivatives.  MEDICATIONS:  Current Outpatient Medications  Medication Sig Dispense Refill   albuterol (PROAIR HFA) 108 (90 Base) MCG/ACT inhaler 2 puffs every 4 hours as needed only  if your can't catch your breath 18 g 5   ALPRAZolam (XANAX) 1 MG tablet TAKE 1 TABLET BY MOUTH THREE TIMES DAILY AS NEEDED FOR ANXIETY 90 tablet 2   ARIPiprazole (ABILIFY) 5 MG tablet Take 1 tablet (5 mg total) by mouth daily. (Patient not taking: Reported on 03/27/2023) 30 tablet 5   atorvastatin (LIPITOR) 20 MG tablet TAKE 1 TABLET BY MOUTH AT BEDTIME 90 tablet 1   benzonatate (TESSALON) 100 MG capsule TAKE ONE CAPSULE BY MOUTH THREE TIMES DAILY 90 capsule 1   calcipotriene (DOVONOX) 0.005 % cream Apply to affected area bid when on break from steroid cream 120 g 3   cyclobenzaprine (FLEXERIL) 10 MG tablet Take 10 mg by mouth 3 (three) times daily as needed.     dicyclomine (BENTYL) 10 MG capsule Take 1 capsule (10 mg total) by mouth every 8 (eight) hours as needed for spasms. Please keep your May appointment for further refills. Thank you. 60 capsule 1   diphenoxylate-atropine (LOMOTIL) 2.5-0.025 MG tablet TAKE 1 TABLET BY MOUTH FOUR TIMES DAILY AS NEEDED FOR diarrhea OR loose stools 60 tablet 0   DULoxetine (CYMBALTA) 60 MG capsule TAKE TWO CAPSULES BY MOUTH EVERY DAY (Patient taking differently: Take 60 mg by mouth daily. Patient states that she  adjusted her dose herself.) 180 capsule 1   FEROSUL 325 (65 Fe) MG tablet TAKE 1 TABLET BY MOUTH DAILY WITH BREAKFAST 30 tablet 3   fluticasone (FLONASE) 50 MCG/ACT nasal spray INSTILL 2 SPRAYS IN EACH NOSTRIL EVERY DAY 16 g 6   Fluticasone-Umeclidin-Vilant (TRELEGY ELLIPTA) 200-62.5-25 MCG/ACT AEPB  Inhale 1 puff into the lungs daily. 60 each 11   furosemide (LASIX) 20 MG tablet TAKE 1 TABLET BY MOUTH TWICE DAILY, IF SWELLING IMPROVES CAN HOLD MEDICATION 90 tablet 0   HYDROcodone-acetaminophen (NORCO) 10-325 MG tablet TAKE 1 TABLET BY MOUTH EVERY 4 HOURS AS NEEDED 150 tablet 0   hydrOXYzine (ATARAX) 10 MG tablet TAKE 1 TABLET BY MOUTH THREE TIMES DAILY AS NEEDED FOR ITCHING 30 tablet 2   ketorolac (ACULAR) 0.5 % ophthalmic solution Place 1 drop into the left eye 4 (four) times daily.     levothyroxine (SYNTHROID) 137 MCG tablet Take 1 tablet (137 mcg total) by mouth daily before breakfast. 90 tablet 3   loratadine (CLARITIN) 10 MG tablet Take 10 mg by mouth daily.     meclizine (ANTIVERT) 12.5 MG tablet TAKE 1 TABLET BY MOUTH THREE TIMES DAILY AS NEEDED FOR DIZZINESS 30 tablet 1   meloxicam (MOBIC) 15 MG tablet TAKE 1 TABLET BY MOUTH EVERY DAY 30 tablet 2   OLANZapine (ZYPREXA) 10 MG tablet Take 1 tablet (10 mg total) by mouth at bedtime. 30 tablet 2   omeprazole (PRILOSEC) 40 MG capsule TAKE ONE CAPSULE BY MOUTH TWICE DAILY 180 capsule 1   ondansetron (ZOFRAN) 8 MG tablet TAKE 1 TABLET BY MOUTH TWICE DAILY 60 tablet 1   permethrin (ELIMITE) 5 % cream      potassium chloride SA (KLOR-CON M) 20 MEQ tablet TAKE 1 TABLET BY MOUTH TWICE DAILY (Patient taking differently: Take 20 mEq by mouth daily. Patient states that she adjusted the dose herself.) 60 tablet 2   prochlorperazine (COMPAZINE) 10 MG tablet Take 1 tablet (10 mg total) by mouth every 6 (six) hours as needed for nausea or vomiting. 30 tablet 0   Rimegepant Sulfate (NURTEC) 75 MG TBDP Take 1 tablet (75 mg total) by mouth daily as needed (for  migraine-). Take within 15 minutes of initial symptoms 8 tablet 5   topiramate (TOPAMAX) 100 MG tablet Take 1 tablet (100 mg total) by mouth at bedtime. Changed to 100 mg tabs- for ease 90 tablet 1   triamcinolone ointment (KENALOG) 0.5 % Apply 1 Application topically 3 (three) times daily. Apply thin-layer on affected areas, no more than 2 weeks consistently. 60 g 2   triamterene-hydrochlorothiazide (MAXZIDE-25) 37.5-25 MG tablet Take 1 tablet by mouth daily.     No current facility-administered medications for this visit.    REVIEW OF SYSTEMS:   Constitutional: ( - ) fevers, ( - )  chills , ( - ) night sweats Eyes: ( - ) blurriness of vision, ( - ) double vision, ( - ) watery eyes Ears, nose, mouth, throat, and face: ( - ) mucositis, ( - ) sore throat Respiratory: ( - ) cough, ( -) dyspnea, ( - ) wheezes Cardiovascular: ( - ) palpitation, ( - ) chest discomfort, ( - ) lower extremity swelling Gastrointestinal:  ( +) nausea, ( - ) heartburn, ( - ) change in bowel habits Skin: ( - ) abnormal skin rashes Lymphatics: ( - ) new lymphadenopathy, ( - ) easy bruising Neurological: (+ ) numbness, ( - ) tingling, ( - ) new weaknesses Behavioral/Psych: ( - ) mood change, ( - ) new changes  All other systems were reviewed with the patient and are negative.  PHYSICAL EXAMINATION: ECOG PERFORMANCE STATUS: 1 - Symptomatic but completely ambulatory  There were no vitals filed for this visit.   There were no vitals filed for this visit.  GENERAL: Well-appearing middle-age Caucasian female, alert, no distress and comfortable SKIN: skin color, texture, turgor are normal, no rashes or significant lesions EYES: conjunctiva are pink and non-injected, sclera clear LUNGS:  normal breathing effort. Diffuse wheezing hear on ausculation.  HEART: regular rate & rhythm and no murmurs. Mild bilateral lower extremity edema Musculoskeletal: no cyanosis of digits and no clubbing  PSYCH: alert & oriented x 3,  fluent speech NEURO: no focal motor/sensory deficits  LABORATORY DATA:  I have reviewed the data as listed    Latest Ref Rng & Units 04/10/2023   11:02 AM 03/27/2023    4:15 PM 02/26/2023   10:39 AM  CBC  WBC 4.0 - 10.5 K/uL 9.0  7.7  6.7   Hemoglobin 12.0 - 15.0 g/dL 16.1  09.6  04.5   Hematocrit 36.0 - 46.0 % 41.1  45.1  41.0   Platelets 150 - 400 K/uL 188  207.0  166        Latest Ref Rng & Units 04/10/2023   11:02 AM 03/27/2023    4:15 PM 02/26/2023   10:39 AM  CMP  Glucose 70 - 99 mg/dL 409  87  94   BUN 8 - 23 mg/dL 13  13  16    Creatinine 0.44 - 1.00 mg/dL 8.11  9.14  7.82   Sodium 135 - 145 mmol/L 128  134  137   Potassium 3.5 - 5.1 mmol/L 3.6  3.9  4.2   Chloride 98 - 111 mmol/L 95  97  104   CO2 22 - 32 mmol/L 28  31  26    Calcium 8.9 - 10.3 mg/dL 8.1  8.8  8.5   Total Protein 6.5 - 8.1 g/dL 6.5  6.5  6.0   Total Bilirubin 0.0 - 1.2 mg/dL 0.4  0.4  0.4   Alkaline Phos 38 - 126 U/L 66  65  65   AST 15 - 41 U/L 14  14  14    ALT 0 - 44 U/L 11  9  8      No results found for: "MPROTEIN" Lab Results  Component Value Date   KPAFRELGTCHN 0.75 06/30/2014   LAMBDASER 3.78 (H) 06/30/2014   KAPLAMBRATIO 0.20 (L) 06/30/2014     RADIOGRAPHIC STUDIES: No results found.    ASSESSMENT & PLAN ELYANAH FARINO 68 y.o. female with medical history significant for extensive stage small cell lung cancer who presents for a follow up visit.   After review of the labs, review of the records, and discussion with the patient the patients findings are most consistent with extensive stage small cell lung cancer with metastasis from the right lower lobe to the lymph nodes of the abdomen.  At this time we will pursue triple therapy with carboplatin, etoposide, and atezolizumab.  After 4 cycles we will convert to maintenance atezolizumab alone.  We previously discussed the risks and benefits of this therapy and the patient was in agreement to proceed with this treatment.  The treatment of  choice consist of carboplatin, etoposide, and atezolizumab.  The regimen consists of carboplatin AUC of 5 IV on day 1, etoposide 100 mg per metered squared IV on day 1, 2, and 3 and atezolizumab 1200 mg on day 1.  This continues for 21-day cycles.  After 4 cycles the patient proceeds with atezolizumab maintenance therapy alone.    # Small Cell Lung Cancer, Extensive Stage -- MRI of the brain shows no evidence of intracranial spread --Findings are currently consistent with metastatic  small cell lung cancer with metastatic spread to the lymph nodes of the abdomen -- Started carboplatin, etoposide, and atezolizumab on 11/13/2020. Transitioned to maintenance Atezolizumab on 02/22/2021.  Plan: --today is Cycle 38 Day 1 of Atezolizumab maintenance.  --labs today were reviewed and adequate for treatment. Hgb ***. Creatinine stable at ***. LFTs in range. Thyroid panel normal. -- Most recent CT CAP from 01/06/2023 which shows no evidence of recurrence. Repeat scan due in March 2025.  Ordered today.  --Proceed with treatment without any dose modifications.  --RTC in 3 weeks for a follow up before Cycle 39  # Early Cirrhosis --noted on MR abdomen from 05/22/2022. [place] with elastography performed on 06/18/2022. --continue to follow with GI.   #Right renal lesion: --Noted to be benign on most recent MRI of the abdomen.  #Diarrhea-improved --stool sent for C. Diff and GI pathology panel, all negative --patient following with GI and found to have pancreatic insufficiency and started on creon with improvement of symptoms.   #Nausea: --Symptoms improved with zofran and olanzapine.  --Added IV zofran to infusion premeds for better symptom control.   #Hypokalemia:  --currently taking potassium chloride 20 meq BID.   --potassium level is 3.6 today. Continue PO supplementation.   #Lower extremity edema --Currently takes lasix 20 mg 1-2 times a day. --Stable. Continue to monitor.   #Neuropathy involving  fingers/feet: --Patient currently takes Cymbalta   #Supportive Care -- chemotherapy education complete -- port placed -- zofran 8mg  q8H PRN and compazine 10mg  PO q6H for nausea -- EMLA cream for port  No orders of the defined types were placed in this encounter.  All questions were answered. The patient knows to call the clinic with any problems, questions or concerns.  I have spent a total of 30 minutes minutes of face-to-face and non-face-to-face time, preparing to see the patient, performing a medically appropriate examination, counseling and educating the patient, ordering medications/tests, documenting clinical information in the electronic health record, and care coordination.   Ulysees Barns, MD Department of Hematology/Oncology [facility] Phone: [phone] Pager: [phone] Email: [email or web address]    04/29/2023 10:52 PM

## 2023-04-30 ENCOUNTER — Inpatient Hospital Stay: Payer: 59 | Attending: Hematology and Oncology

## 2023-04-30 ENCOUNTER — Encounter: Payer: Self-pay | Admitting: *Deleted

## 2023-04-30 ENCOUNTER — Inpatient Hospital Stay: Payer: 59

## 2023-04-30 ENCOUNTER — Inpatient Hospital Stay: Attending: Hematology and Oncology | Admitting: Dietician

## 2023-04-30 ENCOUNTER — Inpatient Hospital Stay (HOSPITAL_BASED_OUTPATIENT_CLINIC_OR_DEPARTMENT_OTHER): Payer: 59 | Admitting: Hematology and Oncology

## 2023-04-30 VITALS — BP 151/82 | HR 82 | Temp 97.6°F | Resp 16 | Wt 173.8 lb

## 2023-04-30 DIAGNOSIS — Z888 Allergy status to other drugs, medicaments and biological substances status: Secondary | ICD-10-CM | POA: Insufficient documentation

## 2023-04-30 DIAGNOSIS — Z5941 Food insecurity: Secondary | ICD-10-CM | POA: Insufficient documentation

## 2023-04-30 DIAGNOSIS — F17210 Nicotine dependence, cigarettes, uncomplicated: Secondary | ICD-10-CM | POA: Insufficient documentation

## 2023-04-30 DIAGNOSIS — E785 Hyperlipidemia, unspecified: Secondary | ICD-10-CM | POA: Diagnosis not present

## 2023-04-30 DIAGNOSIS — Z880 Allergy status to penicillin: Secondary | ICD-10-CM | POA: Insufficient documentation

## 2023-04-30 DIAGNOSIS — K5730 Diverticulosis of large intestine without perforation or abscess without bleeding: Secondary | ICD-10-CM | POA: Insufficient documentation

## 2023-04-30 DIAGNOSIS — C3431 Malignant neoplasm of lower lobe, right bronchus or lung: Secondary | ICD-10-CM | POA: Insufficient documentation

## 2023-04-30 DIAGNOSIS — Z814 Family history of other substance abuse and dependence: Secondary | ICD-10-CM | POA: Insufficient documentation

## 2023-04-30 DIAGNOSIS — E034 Atrophy of thyroid (acquired): Secondary | ICD-10-CM | POA: Diagnosis not present

## 2023-04-30 DIAGNOSIS — E119 Type 2 diabetes mellitus without complications: Secondary | ICD-10-CM | POA: Insufficient documentation

## 2023-04-30 DIAGNOSIS — Z882 Allergy status to sulfonamides status: Secondary | ICD-10-CM | POA: Insufficient documentation

## 2023-04-30 DIAGNOSIS — Z8261 Family history of arthritis: Secondary | ICD-10-CM | POA: Insufficient documentation

## 2023-04-30 DIAGNOSIS — E876 Hypokalemia: Secondary | ICD-10-CM | POA: Diagnosis not present

## 2023-04-30 DIAGNOSIS — Z886 Allergy status to analgesic agent status: Secondary | ICD-10-CM | POA: Insufficient documentation

## 2023-04-30 DIAGNOSIS — K7460 Unspecified cirrhosis of liver: Secondary | ICD-10-CM | POA: Diagnosis not present

## 2023-04-30 DIAGNOSIS — Z95828 Presence of other vascular implants and grafts: Secondary | ICD-10-CM | POA: Diagnosis not present

## 2023-04-30 DIAGNOSIS — Z86018 Personal history of other benign neoplasm: Secondary | ICD-10-CM | POA: Diagnosis not present

## 2023-04-30 DIAGNOSIS — I10 Essential (primary) hypertension: Secondary | ICD-10-CM | POA: Diagnosis not present

## 2023-04-30 DIAGNOSIS — Z823 Family history of stroke: Secondary | ICD-10-CM | POA: Insufficient documentation

## 2023-04-30 DIAGNOSIS — M419 Scoliosis, unspecified: Secondary | ICD-10-CM | POA: Insufficient documentation

## 2023-04-30 DIAGNOSIS — Z90710 Acquired absence of both cervix and uterus: Secondary | ICD-10-CM | POA: Insufficient documentation

## 2023-04-30 DIAGNOSIS — F419 Anxiety disorder, unspecified: Secondary | ICD-10-CM | POA: Insufficient documentation

## 2023-04-30 DIAGNOSIS — G8929 Other chronic pain: Secondary | ICD-10-CM | POA: Insufficient documentation

## 2023-04-30 DIAGNOSIS — Z825 Family history of asthma and other chronic lower respiratory diseases: Secondary | ICD-10-CM | POA: Insufficient documentation

## 2023-04-30 DIAGNOSIS — Z5112 Encounter for antineoplastic immunotherapy: Secondary | ICD-10-CM | POA: Insufficient documentation

## 2023-04-30 DIAGNOSIS — Z8719 Personal history of other diseases of the digestive system: Secondary | ICD-10-CM | POA: Diagnosis not present

## 2023-04-30 DIAGNOSIS — Z885 Allergy status to narcotic agent status: Secondary | ICD-10-CM | POA: Insufficient documentation

## 2023-04-30 DIAGNOSIS — Z79899 Other long term (current) drug therapy: Secondary | ICD-10-CM | POA: Insufficient documentation

## 2023-04-30 DIAGNOSIS — Z79620 Long term (current) use of immunosuppressive biologic: Secondary | ICD-10-CM | POA: Diagnosis not present

## 2023-04-30 DIAGNOSIS — Z9049 Acquired absence of other specified parts of digestive tract: Secondary | ICD-10-CM | POA: Insufficient documentation

## 2023-04-30 DIAGNOSIS — J432 Centrilobular emphysema: Secondary | ICD-10-CM | POA: Insufficient documentation

## 2023-04-30 DIAGNOSIS — Z809 Family history of malignant neoplasm, unspecified: Secondary | ICD-10-CM | POA: Insufficient documentation

## 2023-04-30 DIAGNOSIS — F32A Depression, unspecified: Secondary | ICD-10-CM | POA: Diagnosis not present

## 2023-04-30 DIAGNOSIS — Z811 Family history of alcohol abuse and dependence: Secondary | ICD-10-CM | POA: Insufficient documentation

## 2023-04-30 DIAGNOSIS — Z90721 Acquired absence of ovaries, unilateral: Secondary | ICD-10-CM | POA: Insufficient documentation

## 2023-04-30 DIAGNOSIS — Z8249 Family history of ischemic heart disease and other diseases of the circulatory system: Secondary | ICD-10-CM | POA: Insufficient documentation

## 2023-04-30 DIAGNOSIS — Z5986 Financial insecurity: Secondary | ICD-10-CM | POA: Insufficient documentation

## 2023-04-30 LAB — CBC WITH DIFFERENTIAL (CANCER CENTER ONLY)
Abs Immature Granulocytes: 0.04 K/uL (ref 0.00–0.07)
Basophils Absolute: 0.1 K/uL (ref 0.0–0.1)
Basophils Relative: 1 %
Eosinophils Absolute: 0.2 K/uL (ref 0.0–0.5)
Eosinophils Relative: 3 %
HCT: 41.5 % (ref 36.0–46.0)
Hemoglobin: 14 g/dL (ref 12.0–15.0)
Immature Granulocytes: 1 %
Lymphocytes Relative: 30 %
Lymphs Abs: 2.1 K/uL (ref 0.7–4.0)
MCH: 31.3 pg (ref 26.0–34.0)
MCHC: 33.7 g/dL (ref 30.0–36.0)
MCV: 92.6 fL (ref 80.0–100.0)
Monocytes Absolute: 0.5 K/uL (ref 0.1–1.0)
Monocytes Relative: 7 %
Neutro Abs: 4.3 K/uL (ref 1.7–7.7)
Neutrophils Relative %: 58 %
Platelet Count: 174 K/uL (ref 150–400)
RBC: 4.48 MIL/uL (ref 3.87–5.11)
RDW: 13.8 % (ref 11.5–15.5)
WBC Count: 7.2 K/uL (ref 4.0–10.5)
nRBC: 0 % (ref 0.0–0.2)

## 2023-04-30 LAB — CMP (CANCER CENTER ONLY)
ALT: 9 U/L (ref 0–44)
AST: 15 U/L (ref 15–41)
Albumin: 3.7 g/dL (ref 3.5–5.0)
Alkaline Phosphatase: 67 U/L (ref 38–126)
Anion gap: 6 (ref 5–15)
BUN: 9 mg/dL (ref 8–23)
CO2: 27 mmol/L (ref 22–32)
Calcium: 8.6 mg/dL — ABNORMAL LOW (ref 8.9–10.3)
Chloride: 99 mmol/L (ref 98–111)
Creatinine: 1.08 mg/dL — ABNORMAL HIGH (ref 0.44–1.00)
GFR, Estimated: 56 mL/min — ABNORMAL LOW (ref >60)
Glucose, Bld: 103 mg/dL — ABNORMAL HIGH (ref 70–99)
Potassium: 3.4 mmol/L — ABNORMAL LOW (ref 3.5–5.1)
Sodium: 132 mmol/L — ABNORMAL LOW (ref 135–145)
Total Bilirubin: 0.5 mg/dL (ref 0.0–1.2)
Total Protein: 6.1 g/dL — ABNORMAL LOW (ref 6.5–8.1)

## 2023-04-30 MED ORDER — SODIUM CHLORIDE 0.9 % IV SOLN: Freq: Once | INTRAVENOUS | Status: AC

## 2023-04-30 MED ORDER — ONDANSETRON HCL 4 MG/2ML IJ SOLN
8.0000 mg | Freq: Once | INTRAMUSCULAR | Status: AC
  Administered 2023-04-30: 8 mg via INTRAVENOUS

## 2023-04-30 MED ORDER — SODIUM CHLORIDE 0.9 % IV SOLN
1200.0000 mg | Freq: Once | INTRAVENOUS | Status: AC
  Administered 2023-04-30: 1200 mg via INTRAVENOUS
  Filled 2023-04-30: qty 20
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
## 2023-05-01 ENCOUNTER — Encounter: Payer: Self-pay | Admitting: Physician Assistant

## 2023-05-07 ENCOUNTER — Other Ambulatory Visit: Payer: Self-pay | Admitting: Physical Medicine and Rehabilitation

## 2023-05-08 ENCOUNTER — Ambulatory Visit: Payer: 59 | Admitting: Physician Assistant

## 2023-05-08 VITALS — BP 108/68 | HR 67 | Temp 97.3°F | Ht 63.0 in | Wt 171.8 lb

## 2023-05-08 DIAGNOSIS — E876 Hypokalemia: Secondary | ICD-10-CM

## 2023-05-08 DIAGNOSIS — E1140 Type 2 diabetes mellitus with diabetic neuropathy, unspecified: Secondary | ICD-10-CM | POA: Diagnosis not present

## 2023-05-08 DIAGNOSIS — R209 Unspecified disturbances of skin sensation: Secondary | ICD-10-CM | POA: Diagnosis not present

## 2023-05-08 DIAGNOSIS — M170 Bilateral primary osteoarthritis of knee: Secondary | ICD-10-CM

## 2023-05-08 DIAGNOSIS — F419 Anxiety disorder, unspecified: Secondary | ICD-10-CM

## 2023-05-08 DIAGNOSIS — I83893 Varicose veins of bilateral lower extremities with other complications: Secondary | ICD-10-CM

## 2023-05-08 DIAGNOSIS — C3431 Malignant neoplasm of lower lobe, right bronchus or lung: Secondary | ICD-10-CM | POA: Diagnosis not present

## 2023-05-08 DIAGNOSIS — G629 Polyneuropathy, unspecified: Secondary | ICD-10-CM | POA: Diagnosis not present

## 2023-05-08 DIAGNOSIS — F32A Depression, unspecified: Secondary | ICD-10-CM

## 2023-05-08 LAB — POCT GLYCOSYLATED HEMOGLOBIN (HGB A1C): Hemoglobin A1C: 5.5 % (ref 4.0–5.6)

## 2023-05-08 MED ORDER — POTASSIUM CHLORIDE 20 MEQ/15ML (10%) PO SOLN: 20.0000 meq | Freq: Two times a day (BID) | ORAL | 1 refills | Status: DC

## 2023-05-08 NOTE — Progress Notes (Addendum)
 Patient ID: [name], female    DOB: November 16, 1955, 68 y.o.   MRN: [id number]   Assessment & Plan:  Small cell carcinoma of lower lobe of right lung (HCC)  Type 2 diabetes mellitus with diabetic neuropathy, without long-term current use of insulin (HCC) -     Microalbumin / creatinine urine ratio -     POCT glycosylated hemoglobin (Hb A1C)  Hypokalemia -     Potassium Chloride; Take 15 mLs (20 mEq total) by mouth 2 (two) times daily.  Dispense: 473 mL; Refill: 1  Varicose veins of both legs with edema -     Ambulatory referral to Vascular Surgery  Neuropathy -     Ambulatory referral to Vascular Surgery  Bilateral cold feet -     Ambulatory referral to Vascular Surgery  Anxiety and depression    Assessment & Plan Small Cell Lung Cancer Undergoing chemotherapy every three weeks for small cell carcinoma of the lower lobe of the right lung. Experiences significant side effects, including fatigue, nausea, and anorexia, affecting quality of life. Reports only one good week out of three due to the treatment regimen. Treatment is ongoing without a defined endpoint, and she is considering discontinuation due to the impact on her quality of life. She expressed regret about starting chemotherapy and is aware that discontinuation may lead to disease progression. - Continue current chemotherapy regimen - Discuss potential discontinuation of chemotherapy if quality of life does not improve - Follow with oncology   Peripheral Edema and Cold Feet Reports trace edema in lower extremities and cold feet. Pulses are faint, with diabetes and smoking history contributing to suspected vascular issues. Concerns about neuropathy and limited range of motion in the right ankle, potentially affecting blood flow. - Refer to vein and vascular specialist for evaluation - Recommend wearing compression stockings and keeping feet elevated - Consider ultrasound to assess circulation  Hypokalemia Low  potassium levels, last recorded at 3.4 mEq/L. Currently taking half of prescribed potassium dosage due to difficulty swallowing large tablets, resulting in symptoms such as muscle cramps. A potassium solution was identified as an alternative to improve compliance. - Prescribe potassium solution to improve compliance and address hypokalemia  Knee Osteoarthritis Severe osteoarthritis in knees, with the right knee particularly affected. Past due for knee replacement surgery but managing with cortisone injections, which have provided significant relief recently. - Continue with cortisone injections as needed for pain management  Anxiety Experiences chronic stress and anxiety, exacerbated by recent financial and family issues. Well-managed on alprazolam 1 mg three times daily. Recent financial stress due to a scam has increased anxiety levels. 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    Subjective:    Chief Complaint  Patient presents with   Medical Management of Chronic Issues    Pt in office for 4 mon f/u with PCP; pt has concerns with tripping due to swelling and pain in feet in ankles; almost like they go to sleep; also states can't keep shoes on her feet because of the swelling; tripping over her feet; pt says limbs stay cold and feels like she has socks on at all times; completed simple foot exam;     HPI Discussed the use of AI scribe software for clinical note transcription with  the patient, who gave verbal consent to proceed.  History of Present Illness [name] is a 68 year old female with small cell carcinoma of the right lung who presents for regular follow-up.  She is undergoing chemotherapy every three weeks for small cell carcinoma of the right  lung. She experiences significant side effects, including extreme fatigue, nausea, and lack of appetite, which last for two weeks out of every three, leaving her with only one good week between cycles. These symptoms significantly impact her daily activities and quality of life.  She has issues with her feet, including swelling, coldness, and numbness upon standing. She experiences severe muscle cramps, which she attributes to low potassium levels. She is prescribed potassium 20 mEq twice daily but only takes half the dose due to difficulty swallowing the large tablets. Her last potassium level was noted to be low.  She has a history of knee problems and has been advised to delay knee replacement surgery due to her current health status. She receives cortisone injections for pain management, which have provided significant relief recently. She also reports limited mobility in her right ankle, affecting her ability to perform daily tasks.  She experiences chronic stress at home, exacerbated by financial difficulties following a scam that left her in debt. She is on alprazolam 1 mg three times daily for anxiety, which she feels is stable at present. She also mentions family stressors, including her grandson living with her without contributing to household expenses.  Her diabetes is well-controlled with lifestyle modifications, and her last A1c was 5.5%.     Past Medical History:  Diagnosis Date   Allergic rhinitis    Anemia    Anxiety    Chicken pox    Chronic back pain    COPD (chronic obstructive pulmonary disease) (HCC)    Depression    DM (diabetes mellitus) (HCC)    Essential hypertension    GERD (gastroesophageal reflux disease)    Headache    migraines   History of gastritis    EGD 2015   History of home oxygen therapy    2 liters at hs last 6 months   Hyperlipidemia    Hypothyroidism    Migraines    Osteoarthritis    oa   Scoliosis    Small cell lung cancer (HCC)    Stage  4    Past Surgical History:  Procedure Laterality Date   APPENDECTOMY     1985   BIOPSY  07/24/2017   Procedure: BIOPSY;  Surgeon: Janel Medford, MD;  Location: [facility];  Service: Endoscopy;;   CARDIAC CATHETERIZATION N/A 10/31/2015   Procedure: Left Heart Cath and Coronary Angiography;  Surgeon: Arleen Lacer, MD;  Location: [facility];  Service: Cardiovascular;  Laterality: N/A;   CARPAL TUNNEL RELEASE Left    CARPAL TUNNEL RELEASE Right    CATARACT EXTRACTION W/PHACO Left 02/07/2023   Procedure: CATARACT EXTRACTION PHACO AND INTRAOCULAR LENS PLACEMENT (IOC);  Surgeon: Ardeth Krabbe, MD;  Location: [facility];  Service: Ophthalmology;  Laterality: Left;  CDE: 3.51   CATARACT EXTRACTION W/PHACO Right 02/21/2023   Procedure: CATARACT EXTRACTION PHACO AND INTRAOCULAR LENS PLACEMENT (IOC);  Surgeon: Ardeth Krabbe, MD;  Location: [facility];  Service: Ophthalmology;  Laterality: Right;  CDE: 2.94   CHOLECYSTECTOMY  late 1980's   COLONOSCOPY WITH PROPOFOL N/A 07/24/2017   Procedure: COLONOSCOPY WITH PROPOFOL;  Surgeon: Janel Medford, MD;  Location: [facility];  Service: Endoscopy;  Laterality: N/A;  ESOPHAGOGASTRODUODENOSCOPY N/A 07/24/2017   Procedure: ESOPHAGOGASTRODUODENOSCOPY (EGD);  Surgeon: Janel Medford, MD;  Location: [facility];  Service: Endoscopy;  Laterality: N/A;   ESOPHAGOGASTRODUODENOSCOPY (EGD) WITH PROPOFOL N/A 10/19/2020   Procedure: ESOPHAGOGASTRODUODENOSCOPY (EGD) WITH PROPOFOL;  Surgeon: Janel Medford, MD;  Location: [facility];  Service: Endoscopy;  Laterality: N/A;   FINE NEEDLE ASPIRATION N/A 10/19/2020   Procedure: FINE NEEDLE ASPIRATION (FNA) LINEAR;  Surgeon: Janel Medford, MD;  Location: [facility];  Service: Endoscopy;  Laterality: N/A;   GALLBLADDER SURGERY  1991   HIP CLOSED REDUCTION Right 01/08/2016   Procedure: CLOSED MANIPULATION HIP;  Surgeon: Orvan Blanch, MD;  Location: [facility];  Service: Orthopedics;  Laterality: Right;    HIP CLOSED REDUCTION Right 01/19/2016   Procedure: ATTEMPTED CLOSED REDUCTION RIGHT HIP;  Surgeon: Amada Backer, MD;  Location: [facility];  Service: Orthopedics;  Laterality: Right;   HIP CLOSED REDUCTION Right 01/20/2016   Procedure: CLOSED REDUCTION RIGHT TOTAL HIP;  Surgeon: Claiborne Crew, MD;  Location: [facility];  Service: Orthopedics;  Laterality: Right;   HIP CLOSED REDUCTION Right 02/17/2016   Procedure: CLOSED REDUCTION RIGHT TOTAL HIP;  Surgeon: Adonica Hoose, MD;  Location: [facility];  Service: Orthopedics;  Laterality: Right;   HIP CLOSED REDUCTION Right 02/28/2016   Procedure: CLOSED REDUCTION HIP;  Surgeon: Janeth Medicus, MD;  Location: [facility];  Service: Orthopedics;  Laterality: Right;   IR IMAGING GUIDED PORT INSERTION  11/01/2020   POLYPECTOMY  07/24/2017   Procedure: POLYPECTOMY;  Surgeon: Janel Medford, MD;  Location: [facility];  Service: Endoscopy;;   TONSILLECTOMY     TOTAL ABDOMINAL HYSTERECTOMY     1985, with 1 ovary removed and 2 nd ovary removed 2003   TOTAL HIP ARTHROPLASTY Right    Original surgery 2006 with revision 2010   TOTAL HIP REVISION Right 01/01/2016   Procedure: TOTAL HIP REVISION;  Surgeon: Claiborne Crew, MD;  Location: [facility];  Service: Orthopedics;  Laterality: Right;   TOTAL HIP REVISION Right 03/02/2016   Procedure: TOTAL HIP REVISION;  Surgeon: Claiborne Crew, MD;  Location: [facility];  Service: Orthopedics;  Laterality: Right;   TOTAL HIP REVISION Right 09/02/2016   Procedure: Right hip constrained liner- posterior;  Surgeon: Claiborne Crew, MD;  Location: [facility];  Service: Orthopedics;  Laterality: Right;   ULNAR NERVE TRANSPOSITION Right    UPPER ESOPHAGEAL ENDOSCOPIC ULTRASOUND (EUS) N/A 10/19/2020   Procedure: UPPER ESOPHAGEAL ENDOSCOPIC ULTRASOUND (EUS);  Surgeon: Janel Medford, MD;  Location: [facility];  Service: Endoscopy;  Laterality: N/A;  periduodenal lesion    Family History  Problem Relation Age of Onset   COPD Mother    Heart disease  Mother    Lung disease Father        Asbestosis   Heart attack Father    Heart disease Father    Cerebral aneurysm Brother    Aneurysm Brother        Brain   Drug abuse Daughter    Epilepsy Son    Alcohol abuse Son    Drug abuse Son    Arthritis Maternal Grandmother    Heart disease Maternal Grandmother    Asthma Maternal Grandfather    Cancer Maternal Grandfather    Arthritis Paternal Grandmother    Heart disease Paternal Grandmother    Stroke Paternal Grandmother    Early death Paternal Grandfather    Heart disease Paternal Grandfather     Social History   Tobacco Use   Smoking status:  Every Day    Current packs/day: 2.00    Average packs/day: 2.0 packs/day for 46.0 years (92.0 ttl pk-yrs)    Types: Cigarettes   Smokeless tobacco: Never   Tobacco comments:    2 packs of cigarettes smoked daily 12/13/21- declines smoking cessation  Vaping Use   Vaping status: Never Used  Substance Use Topics   Alcohol use: No   Drug use: No     Allergies  Allergen Reactions   Nisoldipine Other (See Comments)   Metformin And Related Diarrhea   Nsaids Diarrhea   Pregabalin Other (See Comments)    pregabalin   Wellbutrin [Bupropion] Other (See Comments)    Makes her too sleepy    Aleve [Naproxen Sodium] Other (See Comments)    Headache    Penicillins Nausea Only and Other (See Comments)    GI upset    Sulfonamide Derivatives Hives    Review of Systems NEGATIVE UNLESS OTHERWISE INDICATED IN HPI      Objective:     BP 108/68 (BP Location: Left Arm, Patient Position: Sitting, Cuff Size: Normal)   Pulse 67   Temp (!) 97.3 F (36.3 C) (Temporal)   Ht 5\' 3"  (1.6 m)   Wt 171 lb 12.8 oz (77.9 kg)   SpO2 97%   BMI 30.43 kg/m   Wt Readings from Last 3 Encounters:  05/08/23 171 lb 12.8 oz (77.9 kg)  04/30/23 173 lb 12.8 oz (78.8 kg)  04/10/23 174 lb 3.2 oz (79 kg)    BP Readings from Last 3 Encounters:  05/08/23 108/68  04/30/23 (!) 151/82  04/10/23 (!) 150/85      Physical Exam Vitals and nursing note reviewed.  Constitutional:      General: She is not in acute distress.    Appearance: Normal appearance. She is not ill-appearing.  HENT:     Head: Normocephalic.     Right Ear: Tympanic membrane and external ear normal.     Left Ear: Tympanic membrane, ear canal and external ear normal.     Nose: No congestion.     Mouth/Throat:     Mouth: Mucous membranes are moist.     Pharynx: No oropharyngeal exudate or posterior oropharyngeal erythema.  Eyes:     Extraocular Movements: Extraocular movements intact.     Conjunctiva/sclera: Conjunctivae normal.     Pupils: Pupils are equal, round, and reactive to light.  Cardiovascular:     Rate and Rhythm: Normal rate and regular rhythm.     Pulses:          Dorsalis pedis pulses are 1+ on the right side and 1+ on the left side.       Posterior tibial pulses are 1+ on the right side and 1+ on the left side.     Heart sounds: Normal heart sounds. No murmur heard.    Comments: Cool feet Pulmonary:     Effort: Pulmonary effort is normal. No respiratory distress.     Breath sounds: Normal breath sounds. No wheezing.  Musculoskeletal:     Cervical back: Normal range of motion.     Right lower leg: Edema (trace) present.     Left lower leg: Edema (trace) present.  Skin:    General: Skin is warm.  Neurological:     Mental Status: She is alert and oriented to person, place, and time.  Psychiatric:        Mood and Affect: Mood normal.        Behavior: Behavior normal.  Time Spent: 45 minutes of total time was spent on the date of the encounter performing the following actions: chart review prior to seeing the patient, obtaining history, performing a medically necessary exam, counseling on the treatment plan, placing orders, and documenting in our EHR.          Eesa Justiss M Clint Biello, PA-C

## 2023-05-09 ENCOUNTER — Other Ambulatory Visit: Payer: Self-pay

## 2023-05-11 NOTE — Addendum Note (Signed)
 Addended by: Naida Escalante on: 05/11/2023 02:47 PM   Modules accepted: Level of Service

## 2023-05-14 ENCOUNTER — Ambulatory Visit: Payer: 59 | Admitting: Physical Medicine and Rehabilitation

## 2023-05-18 ENCOUNTER — Other Ambulatory Visit: Payer: Self-pay | Admitting: Physician Assistant

## 2023-05-19 ENCOUNTER — Telehealth: Payer: Self-pay | Admitting: Physical Medicine and Rehabilitation

## 2023-05-19 NOTE — Telephone Encounter (Signed)
 Patient called in and is requesting for Dr Raynaldo Call or clinical to contact her back , states she fell Thursday night and would like to discuss with someone

## 2023-05-20 ENCOUNTER — Ambulatory Visit (INDEPENDENT_AMBULATORY_CARE_PROVIDER_SITE_OTHER): Payer: 59

## 2023-05-20 ENCOUNTER — Telehealth: Payer: Self-pay | Admitting: *Deleted

## 2023-05-20 VITALS — Ht 62.25 in | Wt 171.0 lb

## 2023-05-20 DIAGNOSIS — E1140 Type 2 diabetes mellitus with diabetic neuropathy, unspecified: Secondary | ICD-10-CM | POA: Diagnosis not present

## 2023-05-20 DIAGNOSIS — Z0000 Encounter for general adult medical examination without abnormal findings: Secondary | ICD-10-CM | POA: Diagnosis not present

## 2023-05-20 NOTE — Progress Notes (Signed)
 Subjective:   [name] is a 68 y.o. who presents for a Medicare Wellness preventive visit.  Visit Complete: Virtual I connected with  [name] on 05/20/23 by a audio enabled telemedicine application and verified that I am speaking with the correct person using two identifiers.  Patient Location: Home  Provider Location: Office/Clinic  I discussed the limitations of evaluation and management by telemedicine. The patient expressed understanding and agreed to proceed.  Vital Signs: Because this visit was a virtual/telehealth visit, some criteria may be missing or patient reported. Any vitals not documented were not able to be obtained and vitals that have been documented are patient reported.  VideoDeclined- This patient declined Librarian, academic. Therefore the visit was completed with audio only.  Persons Participating in Visit: Patient.  AWV Questionnaire: No: Patient Medicare AWV questionnaire was not completed prior to this visit.  Cardiac Risk Factors include: advanced age (>66men, >49 women);diabetes mellitus;dyslipidemia;obesity (BMI >30kg/m2);hypertension     Objective:    Today's Vitals   05/18/23 1210 05/20/23 1303  Weight:  171 lb (77.6 kg)  Height:  5' 2.25" (1.581 m)  PainSc: 9     Body mass index is 31.03 kg/m.     05/20/2023    1:15 PM 02/21/2023   11:01 AM 02/07/2023    9:28 AM 02/07/2023    8:17 AM 02/04/2023   10:30 AM 01/01/2023    1:00 PM 05/29/2022   12:17 PM  Advanced Directives  Does Patient Have a Medical Advance Directive? Yes Yes  Yes Yes Yes Yes  Type of Estate agent of [place];Living will Healthcare Power of [place];Living will Out of facility DNR (pink MOST or yellow form) Living will Healthcare Power of [place];Living will Healthcare Power of Attorney   Does patient want to make changes to medical advance directive? No - Patient declined No - Patient declined  No - Guardian declined   No - Patient declined No - Patient declined  Copy of Healthcare Power of Attorney in Chart? Yes - validated most recent copy scanned in chart (See row information) No - copy requested  No - copy requested No - copy requested    Pre-existing out of facility DNR order (yellow form or pink MOST form)   --        Current Medications (verified) Outpatient Encounter Medications as of 05/20/2023  Medication Sig   albuterol  (PROAIR  HFA) 108 (90 Base) MCG/ACT inhaler 2 puffs every 4 hours as needed only  if your can't catch your breath   ALPRAZolam  (XANAX ) 1 MG tablet TAKE 1 TABLET BY MOUTH THREE TIMES DAILY AS NEEDED FOR ANXIETY   ARIPiprazole  (ABILIFY ) 5 MG tablet Take 1 tablet (5 mg total) by mouth daily.   atorvastatin  (LIPITOR) 20 MG tablet TAKE 1 TABLET BY MOUTH AT BEDTIME   benzonatate  (TESSALON ) 100 MG capsule TAKE ONE CAPSULE BY MOUTH THREE TIMES DAILY   calcipotriene  (DOVONOX) 0.005 % cream Apply to affected area bid when on break from steroid cream   cyclobenzaprine  (FLEXERIL ) 10 MG tablet Take 10 mg by mouth 3 (three) times daily as needed.   dicyclomine  (BENTYL ) 10 MG capsule Take 1 capsule (10 mg total) by mouth every 8 (eight) hours as needed for spasms. Please keep your May appointment for further refills. Thank you.   diphenoxylate -atropine  (LOMOTIL ) 2.5-0.025 MG tablet TAKE 1 TABLET BY MOUTH FOUR TIMES DAILY AS NEEDED FOR diarrhea OR loose stools   DULoxetine  (CYMBALTA ) 60 MG capsule TAKE  TWO CAPSULES BY MOUTH EVERY DAY (Patient taking differently: Take 60 mg by mouth daily. Patient states that she adjusted her dose herself.)   FEROSUL 325 (65 Fe) MG tablet TAKE 1 TABLET BY MOUTH DAILY WITH BREAKFAST   fluconazole  (DIFLUCAN ) 150 MG tablet Take 150 mg by mouth once.   fluticasone  (FLONASE ) 50 MCG/ACT nasal spray INSTILL 2 SPRAYS IN EACH NOSTRIL EVERY DAY   Fluticasone -Umeclidin-Vilant (TRELEGY ELLIPTA ) 200-62.5-25 MCG/ACT AEPB Inhale 1 puff into the lungs daily.   furosemide  (LASIX ) 20  MG tablet TAKE 1 TABLET BY MOUTH TWICE DAILY, IF SWELLING IMPROVES CAN HOLD MEDICATION   HYDROcodone -acetaminophen  (NORCO) 10-325 MG tablet TAKE 1 TABLET BY MOUTH EVERY 4 HOURS AS NEEDED   hydrOXYzine  (ATARAX ) 10 MG tablet TAKE 1 TABLET BY MOUTH THREE TIMES DAILY AS NEEDED FOR ITCHING   levothyroxine  (SYNTHROID ) 137 MCG tablet Take 1 tablet (137 mcg total) by mouth daily before breakfast.   loratadine  (CLARITIN ) 10 MG tablet Take 10 mg by mouth daily.   meclizine  (ANTIVERT ) 12.5 MG tablet TAKE 1 TABLET BY MOUTH THREE TIMES DAILY AS NEEDED FOR DIZZINESS   meloxicam  (MOBIC ) 15 MG tablet TAKE 1 TABLET BY MOUTH EVERY DAY   OLANZapine  (ZYPREXA ) 10 MG tablet Take 1 tablet (10 mg total) by mouth at bedtime.   omeprazole  (PRILOSEC) 40 MG capsule TAKE ONE CAPSULE BY MOUTH TWICE DAILY   ondansetron  (ZOFRAN ) 8 MG tablet TAKE 1 TABLET BY MOUTH TWICE DAILY   permethrin  (ELIMITE ) 5 % cream    potassium chloride  SA (KLOR-CON  M) 20 MEQ tablet TAKE 1 TABLET BY MOUTH TWICE DAILY (Patient taking differently: Take 20 mEq by mouth daily. Patient states that she adjusted the dose herself.)   prochlorperazine  (COMPAZINE ) 10 MG tablet Take 1 tablet (10 mg total) by mouth every 6 (six) hours as needed for nausea or vomiting.   Rimegepant Sulfate (NURTEC) 75 MG TBDP Take 1 tablet (75 mg total) by mouth daily as needed (for migraine-). Take within 15 minutes of initial symptoms   topiramate  (TOPAMAX ) 100 MG tablet Take 1 tablet (100 mg total) by mouth at bedtime. Changed to 100 mg tabs- for ease   triamcinolone  ointment (KENALOG ) 0.5 % Apply 1 Application topically 3 (three) times daily. Apply thin-layer on affected areas, no more than 2 weeks consistently.   triamterene -hydrochlorothiazide  (MAXZIDE -25) 37.5-25 MG tablet Take 1 tablet by mouth daily.   [DISCONTINUED] ALPRAZolam  (XANAX ) 1 MG tablet Take 1 tablet (1 mg total) by mouth 3 (three) times daily as needed for anxiety. for anxiety   [DISCONTINUED] dicyclomine   (BENTYL ) 10 MG capsule Take 1 capsule (10 mg total) by mouth every 6 (six) hours as needed for spasms.   [DISCONTINUED] diphenoxylate -atropine  (LOMOTIL ) 2.5-0.025 MG tablet Take 1 tablet by mouth 4 (four) times daily as needed for diarrhea or loose stools.   [DISCONTINUED] furosemide  (LASIX ) 20 MG tablet TAKE 1 TABLET BY MOUTH TWICE DAILY, IF SWELLING IMPROVES CAN HOLD MEDICATION   [DISCONTINUED] HYDROcodone -acetaminophen  (NORCO) 10-325 MG tablet Take 1 tablet by mouth every 4 (four) hours as needed. To be filled 01/28/23   [DISCONTINUED] ketorolac  (ACULAR ) 0.5 % ophthalmic solution Place 1 drop into the left eye 4 (four) times daily. (Patient not taking: Reported on 05/08/2023)   [DISCONTINUED] meclizine  (ANTIVERT ) 12.5 MG tablet TAKE 1 TABLET BY MOUTH THREE TIMES DAILY AS NEEDED FOR DIZZINESS   [DISCONTINUED] meloxicam  (MOBIC ) 15 MG tablet TAKE 1 TABLET BY MOUTH EVERY DAY   [DISCONTINUED] methadone  (DOLOPHINE ) 5 MG tablet Take 5 mg by mouth  every 12 (twelve) hours.   [DISCONTINUED] omeprazole  (PRILOSEC) 40 MG capsule TAKE ONE CAPSULE BY MOUTH TWICE DAILY   [DISCONTINUED] potassium chloride  20 MEQ/15ML (10%) SOLN Take 15 mLs (20 mEq total) by mouth 2 (two) times daily.   No facility-administered encounter medications on file as of 05/20/2023.    Allergies (verified) Nisoldipine, Metformin  and related, Nsaids, Pregabalin, Wellbutrin  [bupropion ], Aleve [naproxen sodium], Penicillins, and Sulfonamide derivatives   History: Past Medical History:  Diagnosis Date   Allergic rhinitis    Anemia    Anxiety    Chicken pox    Chronic back pain    COPD (chronic obstructive pulmonary disease) (HCC)    Depression    DM (diabetes mellitus) (HCC)    Essential hypertension    GERD (gastroesophageal reflux disease)    Headache    migraines   History of gastritis    EGD 2015   History of home oxygen  therapy    2 liters at hs last 6 months   Hyperlipidemia    Hypothyroidism    Migraines     Osteoarthritis    oa   Scoliosis    Small cell lung cancer (HCC)    Stage 4   Past Surgical History:  Procedure Laterality Date   APPENDECTOMY     1985   BIOPSY  07/24/2017   Procedure: BIOPSY;  Surgeon: Janel Medford, MD;  Location: [facility];  Service: Endoscopy;;   CARDIAC CATHETERIZATION N/A 10/31/2015   Procedure: Left Heart Cath and Coronary Angiography;  Surgeon: Arleen Lacer, MD;  Location: [facility] INVASIVE CV LAB;  Service: Cardiovascular;  Laterality: N/A;   CARPAL TUNNEL RELEASE Left    CARPAL TUNNEL RELEASE Right    CATARACT EXTRACTION W/PHACO Left 02/07/2023   Procedure: CATARACT EXTRACTION PHACO AND INTRAOCULAR LENS PLACEMENT (IOC);  Surgeon: Ardeth Krabbe, MD;  Location: [facility];  Service: Ophthalmology;  Laterality: Left;  CDE: 3.51   CATARACT EXTRACTION W/PHACO Right 02/21/2023   Procedure: CATARACT EXTRACTION PHACO AND INTRAOCULAR LENS PLACEMENT (IOC);  Surgeon: Ardeth Krabbe, MD;  Location: [facility];  Service: Ophthalmology;  Laterality: Right;  CDE: 2.94   CHOLECYSTECTOMY  late 1980's   COLONOSCOPY WITH PROPOFOL  N/A 07/24/2017   Procedure: COLONOSCOPY WITH PROPOFOL ;  Surgeon: Janel Medford, MD;  Location: [facility];  Service: Endoscopy;  Laterality: N/A;   ESOPHAGOGASTRODUODENOSCOPY N/A 07/24/2017   Procedure: ESOPHAGOGASTRODUODENOSCOPY (EGD);  Surgeon: Janel Medford, MD;  Location: [facility];  Service: Endoscopy;  Laterality: N/A;   ESOPHAGOGASTRODUODENOSCOPY (EGD) WITH PROPOFOL  N/A 10/19/2020   Procedure: ESOPHAGOGASTRODUODENOSCOPY (EGD) WITH PROPOFOL ;  Surgeon: Janel Medford, MD;  Location: [facility];  Service: Endoscopy;  Laterality: N/A;   FINE NEEDLE ASPIRATION N/A 10/19/2020   Procedure: FINE NEEDLE ASPIRATION (FNA) LINEAR;  Surgeon: Janel Medford, MD;  Location: [facility];  Service: Endoscopy;  Laterality: N/A;   GALLBLADDER SURGERY  1991   HIP CLOSED REDUCTION Right 01/08/2016   Procedure: CLOSED MANIPULATION HIP;  Surgeon: Orvan Blanch, MD;  Location: [facility];  Service: Orthopedics;  Laterality: Right;   HIP CLOSED REDUCTION Right 01/19/2016   Procedure: ATTEMPTED CLOSED REDUCTION RIGHT HIP;  Surgeon: Amada Backer, MD;  Location: [facility];  Service: Orthopedics;  Laterality: Right;   HIP CLOSED REDUCTION Right 01/20/2016   Procedure: CLOSED REDUCTION RIGHT TOTAL HIP;  Surgeon: Claiborne Crew, MD;  Location: [facility];  Service: Orthopedics;  Laterality: Right;   HIP CLOSED REDUCTION Right 02/17/2016   Procedure: CLOSED REDUCTION RIGHT TOTAL HIP;  Surgeon: Polly Brink  Swinteck, MD;  Location: [facility];  Service: Orthopedics;  Laterality: Right;   HIP CLOSED REDUCTION Right 02/28/2016   Procedure: CLOSED REDUCTION HIP;  Surgeon: Janeth Medicus, MD;  Location: [facility];  Service: Orthopedics;  Laterality: Right;   IR IMAGING GUIDED PORT INSERTION  11/01/2020   POLYPECTOMY  07/24/2017   Procedure: POLYPECTOMY;  Surgeon: Janel Medford, MD;  Location: [facility];  Service: Endoscopy;;   TONSILLECTOMY     TOTAL ABDOMINAL HYSTERECTOMY     1985, with 1 ovary removed and 2 nd ovary removed 2003   TOTAL HIP ARTHROPLASTY Right    Original surgery 2006 with revision 2010   TOTAL HIP REVISION Right 01/01/2016   Procedure: TOTAL HIP REVISION;  Surgeon: Claiborne Crew, MD;  Location: [facility];  Service: Orthopedics;  Laterality: Right;   TOTAL HIP REVISION Right 03/02/2016   Procedure: TOTAL HIP REVISION;  Surgeon: Claiborne Crew, MD;  Location: [facility];  Service: Orthopedics;  Laterality: Right;   TOTAL HIP REVISION Right 09/02/2016   Procedure: Right hip constrained liner- posterior;  Surgeon: Claiborne Crew, MD;  Location: [facility];  Service: Orthopedics;  Laterality: Right;   ULNAR NERVE TRANSPOSITION Right    UPPER ESOPHAGEAL ENDOSCOPIC ULTRASOUND (EUS) N/A 10/19/2020   Procedure: UPPER ESOPHAGEAL ENDOSCOPIC ULTRASOUND (EUS);  Surgeon: Janel Medford, MD;  Location: [facility];  Service: Endoscopy;  Laterality: N/A;  periduodenal lesion   Family  History  Problem Relation Age of Onset   COPD Mother    Heart disease Mother    Lung disease Father        Asbestosis   Heart attack Father    Heart disease Father    Cerebral aneurysm Brother    Aneurysm Brother        Brain   Drug abuse Daughter    Epilepsy Son    Alcohol abuse Son    Drug abuse Son    Arthritis Maternal Grandmother    Heart disease Maternal Grandmother    Asthma Maternal Grandfather    Cancer Maternal Grandfather    Arthritis Paternal Grandmother    Heart disease Paternal Grandmother    Stroke Paternal Grandmother    Early death Paternal Grandfather    Heart disease Paternal Grandfather    Social History   Socioeconomic History   Marital status: Married    Spouse name: Not on file   Number of children: 2   Years of education: Not on file   Highest education level: 12th grade  Occupational History   Occupation: disabled   Occupation: disabled  Tobacco Use   Smoking status: Every Day    Current packs/day: 2.00    Average packs/day: 2.0 packs/day for 46.0 years (92.0 ttl pk-yrs)    Types: Cigarettes   Smokeless tobacco: Never   Tobacco comments:    2 packs of cigarettes smoked daily 12/13/21- declines smoking cessation  Vaping Use   Vaping status: Never Used  Substance and Sexual Activity   Alcohol use: No   Drug use: No   Sexual activity: Not Currently    Partners: Male  Other Topics Concern   Not on file  Social History Narrative   Right handed    Caffeine~ 2 cups per day    Lives at home with husband (strained relationship)   Primary caretaker for disabled brother who had aneurism   Daughter died July 23, 2018   Social Drivers of Health   Financial Resource Strain: Low Risk  (05/20/2023)   Overall  Financial Resource Strain (CARDIA)    Difficulty of Paying Living Expenses: Not very hard  Food Insecurity: Food Insecurity Present (05/20/2023)   Hunger Vital Sign    Worried About Running Out of Food in the Last Year: Sometimes true    Ran  Out of Food in the Last Year: Sometimes true  Transportation Needs: No Transportation Needs (05/20/2023)   PRAPARE - Administrator, Civil Service (Medical): No    Lack of Transportation (Non-Medical): No  Physical Activity: Inactive (05/20/2023)   Exercise Vital Sign    Days of Exercise per Week: 0 days    Minutes of Exercise per Session: 0 min  Stress: No Stress Concern Present (05/20/2023)   [employer] of Occupational Health - Occupational Stress Questionnaire    Feeling of Stress : Only a little  Recent Concern: Stress - Stress Concern Present (03/31/2023)   [employer] of Occupational Health - Occupational Stress Questionnaire    Feeling of Stress : Very much  Social Connections: Moderately Integrated (05/20/2023)   Social Connection and Isolation Panel [NHANES]    Frequency of Communication with Friends and Family: More than three times a week    Frequency of Social Gatherings with Friends and Family: More than three times a week    Attends Religious Services: 1 to 4 times per year    Active Member of [employer] or Organizations: No    Attends Banker Meetings: Never    Marital Status: Married    Tobacco Counseling Ready to quit: Not Answered Counseling given: Not Answered Tobacco comments: 2 packs of cigarettes smoked daily 12/13/21- declines smoking cessation    Clinical Intake:  Pre-visit preparation completed: Yes  Pain : No/denies pain Pain Score: 9  Pain Type: Chronic pain Pain Location: Generalized (fell this past week increased the pain)     BMI - recorded: 31.03 Nutritional Status: BMI > 30  Obese Nutritional Risks: None Diabetes: Yes CBG done?: No Did pt. bring in CBG monitor from home?: No  Lab Results  Component Value Date   HGBA1C 5.5 05/08/2023   HGBA1C 5.9 (A) 03/06/2022   HGBA1C 6.5 01/05/2021     How often do you need to have someone help you when you read instructions, pamphlets, or other written materials from  your doctor or pharmacy?: 1 - Never  Interpreter Needed?: No  Information entered by :: Lamont Pilsner, LPN   Activities of Daily Living     05/18/2023   12:10 PM 02/07/2023    8:22 AM  In your present state of health, do you have any difficulty performing the following activities:  Hearing? 1 1  Comment hearing aids   Vision? 0 1  Difficulty concentrating or making decisions? 0 0  Walking or climbing stairs? 0   Dressing or bathing? 0   Doing errands, shopping? 0   Preparing Food and eating ? N   Using the Toilet? N   In the past six months, have you accidently leaked urine? Y   Comment wears a pad   Do you have problems with loss of bowel control? N   Managing your Medications? N   Managing your Finances? N   Housekeeping or managing your Housekeeping? N     Patient Care Team: Allwardt, Deleta Felix, PA-C as PCP - General (Physician Assistant) O'Neal, Cathay Clonts, MD as PCP - Cardiology (Cardiology) Janel Medford, MD (Inactive) as Attending Physician (Gastroenterology) Dr. Valentine Gasmen, MD as Consulting Physician (Orthopedic Surgery)  Hazle Lites, MD as Consulting Physician (Orthopedic Surgery) Brian Campanile, MD (Inactive) as Consulting Physician (Neurology) Jhonny Moss, MD as Consulting Physician (Neurology) Myrle Aspen, [facility] (Inactive) (Pharmacist) [facility], [facility]   Indicate any recent Medical Services you may have received from other than [facility] providers in the past year (date may be approximate).     Assessment:   This is a routine wellness examination for Ismelda.  Hearing/Vision screen Hearing Screening - Comments:: Pt has hearing aids  Vision Screening - Comments:: Wears rx glasses - up to date with routine eye exams with  Dr Ulyses Gandy in [place]    Goals Addressed             This Visit's Progress    Patient Stated       Each day, aim for 6 glasses of water , plenty of protein in your diet and  try to get up and walk/ stretch every hour for 5-10 minutes at a time.         Depression Screen     05/20/2023    1:11 PM 05/08/2023    1:25 PM 01/24/2023    1:54 PM 01/06/2023   12:56 PM 12/13/2022    1:42 PM 09/04/2022   11:47 AM 06/05/2022    9:42 AM  PHQ 2/9 Scores  PHQ - 2 Score 2 0 6 6 2 2 6   PHQ- 9 Score 4   17       Fall Risk     05/18/2023   12:10 PM 05/08/2023    1:08 PM 01/24/2023    1:53 PM 01/06/2023   12:57 PM 01/01/2023   11:48 AM  Fall Risk   Falls in the past year? 1 0 1 1 1   Number falls in past yr: 1 0 1 1 1   Comment   last fall was 1 month ago(November)    Injury with Fall? 1 0 1 1 1   Comment brusied ribs      Risk for fall due to : Impaired mobility;Impaired balance/gait;History of fall(s) No Fall Risks;Impaired balance/gait;Impaired mobility;Orthopedic patient History of fall(s);Impaired balance/gait Impaired mobility History of fall(s)  Follow up Falls prevention discussed Falls evaluation completed;Education provided;Falls prevention discussed  Falls evaluation completed Falls evaluation completed    MEDICARE RISK AT HOME:  Medicare Risk at Home Any stairs in or around the home?: (Patient-Rptd) No If so, are there any without handrails?: (Patient-Rptd) No Home free of loose throw rugs in walkways, pet beds, electrical cords, etc?: (Patient-Rptd) Yes Adequate lighting in your home to reduce risk of falls?: (Patient-Rptd) Yes Life alert?: (Patient-Rptd) No Use of a cane, walker or w/c?: (Patient-Rptd) Yes Grab bars in the bathroom?: (Patient-Rptd) No Shower chair or bench in shower?: (Patient-Rptd) Yes Elevated toilet seat or a handicapped toilet?: (Patient-Rptd) Yes  TIMED UP AND GO:  Was the test performed?  No  Cognitive Function: 6CIT completed      10/19/2019    1:00 PM  Montreal Cognitive Assessment   Visuospatial/ Executive (0/5) 3  Naming (0/3) 3  Attention: Read list of digits (0/2) 2  Attention: Read list of letters (0/1) 1   Attention: Serial 7 subtraction starting at 100 (0/3) 1  Language: Repeat phrase (0/2) 2  Language : Fluency (0/1) 1  Abstraction (0/2) 1  Delayed Recall (0/5) 5  Orientation (0/6) 5  Total 24  Adjusted Score (based on education) 24      05/20/2023    1:16 PM 05/16/2022  2:02 PM 05/03/2021    1:42 PM  6CIT Screen  What Year? 0 points 0 points 0 points  What month? 0 points 0 points 0 points  What time? 0 points 0 points 0 points  Count back from 20 0 points 0 points 0 points  Months in reverse 0 points 0 points 0 points  Repeat phrase 0 points 0 points 0 points  Total Score 0 points 0 points 0 points    Immunizations Immunization History  Administered Date(s) Administered   Influenza,inj,Quad PF,6+ Mos 10/02/2018, 11/30/2019, 11/15/2020   Influenza-Unspecified 01/16/2011, 11/26/2011, 12/22/2012, 11/17/2018   PFIZER Comirnaty(Gray Top)Covid-19 Tri-Sucrose Vaccine 05/17/2020   PFIZER(Purple Top)SARS-COV-2 Vaccination 10/20/2019, 11/10/2019   PNEUMOCOCCAL CONJUGATE-20 09/04/2020   Td 06/07/2005    Screening Tests Health Maintenance  Topic Date Due   Diabetic kidney evaluation - Urine ACR  03/11/2020   COVID-19 Vaccine (4 - 2024-25 season) 05/24/2023 (Originally 09/29/2022)   Zoster Vaccines- Shingrix (1 of 2) 08/07/2023 (Originally 01/30/1974)   INFLUENZA VACCINE  08/29/2023   MAMMOGRAM  09/27/2023   HEMOGLOBIN A1C  11/07/2023   OPHTHALMOLOGY EXAM  12/31/2023   Diabetic kidney evaluation - eGFR measurement  04/29/2024   FOOT EXAM  05/07/2024   Medicare Annual Wellness (AWV)  05/19/2024   Colonoscopy  07/25/2027   Pneumonia Vaccine 78+ Years old  Completed   DEXA SCAN  Completed   Hepatitis C Screening  Completed   HPV VACCINES  Aged Out   Meningococcal B Vaccine  Aged Out   DTaP/Tdap/Td  Discontinued    Health Maintenance  Health Maintenance Due  Topic Date Due   Diabetic kidney evaluation - Urine ACR  03/11/2020   Health Maintenance Items Addressed: See Nurse  Notes  Additional Screening:  Vision Screening: Recommended annual ophthalmology exams for early detection of glaucoma and other disorders of the eye.  Dental Screening: Recommended annual dental exams for proper oral hygiene  Community Resource Referral / Chronic Care Management: CRR required this visit?  No   CCM required this visit?  No     Plan:     I have personally reviewed and noted the following in the patient's chart:   Medical and social history Use of alcohol, tobacco or illicit drugs  Current medications and supplements including opioid prescriptions. Patient is currently taking opioid prescriptions. Information provided to patient regarding non-opioid alternatives. Patient advised to discuss non-opioid treatment plan with their provider. Functional ability and status Nutritional status Physical activity Advanced directives List of other physicians Hospitalizations, surgeries, and ER visits in previous 12 months Vitals Screenings to include cognitive, depression, and falls Referrals and appointments  In addition, I have reviewed and discussed with patient certain preventive protocols, quality metrics, and best practice recommendations. A written personalized care plan for preventive services as well as general preventive health recommendations were provided to patient.     Bruno Capri, LPN   1/61/0960   After Visit Summary: (MyChart) Due to this being a telephonic visit, the after visit summary with patients personalized plan was offered to patient via MyChart   Notes: Nothing significant to report at this time.

## 2023-05-20 NOTE — Telephone Encounter (Signed)
 TCT patient in response to VM asking for advice on coming for appts tomorrow since she'd had a fall last week. [name] stated she fell at home last Thursday (4/17). She said it was due to not wearing shoes and tripping over pants leg that was too big and too long. She said she was not dizzy or feeling weak at the time. She states she fell on on her right side across the coffee table with her arm outstretched.  She states she bruised right breast and upper right side and right shoulder. Right side of her back has also  been sore. [name] said she feels better now, but it seemed difficult to take a deep breath for a day or two after the fall.  Asked her if she had been evaluated after the fall at ED or Urgent Care. She said she did not go to ED or Urgent care or call her PCP following the fall, or contact any of her physicians until this call.  [name] stated "I'm stubborn like that".  She stated she had appointments this week with Dr. [name] and Dr. Washington Hacker. She thought that they could check her then. She said she was concerned that she'd fallen on the side where her cancer was and asked again if it would be ok to have her treatment. Advised her that she will see Dr. [name] tomorrow prior to infusion and he will make treatment decision.  Encouraged her that if she became short of breath or had chest pain overnight, she should get emergency care. She said if she felt worse, she would think about it, but stated again that she was feeling better than last week.

## 2023-05-20 NOTE — Patient Instructions (Signed)
 [name] , Thank you for taking time to come for your Medicare Wellness Visit. I appreciate your ongoing commitment to your health goals. Please review the following plan we discussed and let me know if I can assist you in the future.   Referrals/Orders/Follow-Ups/Clinician Recommendations: Each day, aim for 6 glasses of water , plenty of protein in your diet and try to get up and walk/ stretch every hour for 5-10 minutes at a time.  If you wish to quit smoking, help is available. For free tobacco cessation program offerings call the [facility] at [phone] or [facility] Line at (520) 882-6777. You may also visit [email or web address].com or email [email or web address] [email or web address] for more information on other programs.   You may also call 1-800-QUIT-NOW (220-332-8596) or visit www.[place].ch or www.[email or web address] for additional resources on smoking cessation.    This is a list of the screening recommended for you and due dates:  Health Maintenance  Topic Date Due   Mammogram  05/28/2018   Yearly kidney health urinalysis for diabetes  03/11/2020   COVID-19 Vaccine (4 - 2024-25 season) 05/24/2023*   Zoster (Shingles) Vaccine (1 of 2) 08/07/2023*   Flu Shot  08/29/2023   Hemoglobin A1C  11/07/2023   Eye exam for diabetics  12/31/2023   Yearly kidney function blood test for diabetes  04/29/2024   Complete foot exam   05/07/2024   Medicare Annual Wellness Visit  05/19/2024   Colon Cancer Screening  07/25/2027   Pneumonia Vaccine  Completed   DEXA scan (bone density measurement)  Completed   Hepatitis C Screening  Completed   HPV Vaccine  Aged Out   Meningitis B Vaccine  Aged Out   DTaP/Tdap/Td vaccine  Discontinued  *Topic was postponed. The date shown is not the original due date.    Advanced directives: (In Chart) A copy of your advanced directives are scanned into your chart should your provider ever need it.  Next Medicare Annual Wellness Visit scheduled for next year:  Yes

## 2023-05-21 ENCOUNTER — Inpatient Hospital Stay: Payer: 59

## 2023-05-21 ENCOUNTER — Encounter (HOSPITAL_COMMUNITY): Payer: Self-pay | Admitting: Physician Assistant

## 2023-05-21 ENCOUNTER — Inpatient Hospital Stay (HOSPITAL_BASED_OUTPATIENT_CLINIC_OR_DEPARTMENT_OTHER): Payer: 59 | Admitting: Hematology and Oncology

## 2023-05-21 ENCOUNTER — Other Ambulatory Visit: Payer: Self-pay

## 2023-05-21 VITALS — BP 137/86 | HR 90 | Temp 98.3°F | Resp 16 | Wt 177.0 lb

## 2023-05-21 DIAGNOSIS — Z5941 Food insecurity: Secondary | ICD-10-CM | POA: Diagnosis not present

## 2023-05-21 DIAGNOSIS — I10 Essential (primary) hypertension: Secondary | ICD-10-CM | POA: Diagnosis not present

## 2023-05-21 DIAGNOSIS — C3431 Malignant neoplasm of lower lobe, right bronchus or lung: Secondary | ICD-10-CM

## 2023-05-21 DIAGNOSIS — J432 Centrilobular emphysema: Secondary | ICD-10-CM | POA: Diagnosis not present

## 2023-05-21 DIAGNOSIS — E876 Hypokalemia: Secondary | ICD-10-CM | POA: Diagnosis not present

## 2023-05-21 DIAGNOSIS — E119 Type 2 diabetes mellitus without complications: Secondary | ICD-10-CM | POA: Diagnosis not present

## 2023-05-21 DIAGNOSIS — F17210 Nicotine dependence, cigarettes, uncomplicated: Secondary | ICD-10-CM | POA: Diagnosis not present

## 2023-05-21 DIAGNOSIS — M419 Scoliosis, unspecified: Secondary | ICD-10-CM | POA: Diagnosis not present

## 2023-05-21 DIAGNOSIS — Z95828 Presence of other vascular implants and grafts: Secondary | ICD-10-CM

## 2023-05-21 DIAGNOSIS — E785 Hyperlipidemia, unspecified: Secondary | ICD-10-CM | POA: Diagnosis not present

## 2023-05-21 DIAGNOSIS — E034 Atrophy of thyroid (acquired): Secondary | ICD-10-CM | POA: Diagnosis not present

## 2023-05-21 DIAGNOSIS — Z79620 Long term (current) use of immunosuppressive biologic: Secondary | ICD-10-CM | POA: Diagnosis not present

## 2023-05-21 DIAGNOSIS — Z5986 Financial insecurity: Secondary | ICD-10-CM | POA: Diagnosis not present

## 2023-05-21 DIAGNOSIS — Z882 Allergy status to sulfonamides status: Secondary | ICD-10-CM | POA: Diagnosis not present

## 2023-05-21 DIAGNOSIS — I83893 Varicose veins of bilateral lower extremities with other complications: Secondary | ICD-10-CM

## 2023-05-21 DIAGNOSIS — G8929 Other chronic pain: Secondary | ICD-10-CM | POA: Diagnosis not present

## 2023-05-21 DIAGNOSIS — Z8719 Personal history of other diseases of the digestive system: Secondary | ICD-10-CM | POA: Diagnosis not present

## 2023-05-21 DIAGNOSIS — Z885 Allergy status to narcotic agent status: Secondary | ICD-10-CM | POA: Diagnosis not present

## 2023-05-21 DIAGNOSIS — Z79899 Other long term (current) drug therapy: Secondary | ICD-10-CM | POA: Diagnosis not present

## 2023-05-21 DIAGNOSIS — Z5112 Encounter for antineoplastic immunotherapy: Secondary | ICD-10-CM

## 2023-05-21 DIAGNOSIS — K5730 Diverticulosis of large intestine without perforation or abscess without bleeding: Secondary | ICD-10-CM | POA: Diagnosis not present

## 2023-05-21 DIAGNOSIS — Z880 Allergy status to penicillin: Secondary | ICD-10-CM | POA: Diagnosis not present

## 2023-05-21 DIAGNOSIS — K7460 Unspecified cirrhosis of liver: Secondary | ICD-10-CM | POA: Diagnosis not present

## 2023-05-21 DIAGNOSIS — Z86018 Personal history of other benign neoplasm: Secondary | ICD-10-CM | POA: Diagnosis not present

## 2023-05-21 LAB — CBC WITH DIFFERENTIAL (CANCER CENTER ONLY)
Abs Immature Granulocytes: 0.07 10*3/uL (ref 0.00–0.07)
Basophils Absolute: 0.1 10*3/uL (ref 0.0–0.1)
Basophils Relative: 1 %
Eosinophils Absolute: 0.3 10*3/uL (ref 0.0–0.5)
Eosinophils Relative: 4 %
HCT: 40.1 % (ref 36.0–46.0)
Hemoglobin: 13.4 g/dL (ref 12.0–15.0)
Immature Granulocytes: 1 %
Lymphocytes Relative: 29 %
Lymphs Abs: 2.1 10*3/uL (ref 0.7–4.0)
MCH: 30.9 pg (ref 26.0–34.0)
MCHC: 33.4 g/dL (ref 30.0–36.0)
MCV: 92.6 fL (ref 80.0–100.0)
Monocytes Absolute: 0.6 10*3/uL (ref 0.1–1.0)
Monocytes Relative: 8 %
Neutro Abs: 4.1 10*3/uL (ref 1.7–7.7)
Neutrophils Relative %: 57 %
Platelet Count: 169 10*3/uL (ref 150–400)
RBC: 4.33 MIL/uL (ref 3.87–5.11)
RDW: 14.2 % (ref 11.5–15.5)
WBC Count: 7.1 10*3/uL (ref 4.0–10.5)
nRBC: 0 % (ref 0.0–0.2)

## 2023-05-21 LAB — CMP (CANCER CENTER ONLY)
ALT: 23 U/L (ref 0–44)
AST: 25 U/L (ref 15–41)
Albumin: 3.4 g/dL — ABNORMAL LOW (ref 3.5–5.0)
Alkaline Phosphatase: 76 U/L (ref 38–126)
Anion gap: 5 (ref 5–15)
BUN: 12 mg/dL (ref 8–23)
CO2: 28 mmol/L (ref 22–32)
Calcium: 8.4 mg/dL — ABNORMAL LOW (ref 8.9–10.3)
Chloride: 103 mmol/L (ref 98–111)
Creatinine: 1.01 mg/dL — ABNORMAL HIGH (ref 0.44–1.00)
GFR, Estimated: >60 mL/min (ref >60)
Glucose, Bld: 100 mg/dL — ABNORMAL HIGH (ref 70–99)
Potassium: 3.7 mmol/L (ref 3.5–5.1)
Sodium: 136 mmol/L (ref 135–145)
Total Bilirubin: 0.4 mg/dL (ref 0.0–1.2)
Total Protein: 6 g/dL — ABNORMAL LOW (ref 6.5–8.1)

## 2023-05-21 LAB — TSH: TSH: 2.51 u[IU]/mL (ref 0.350–4.500)

## 2023-05-21 MED ORDER — ONDANSETRON HCL 4 MG/2ML IJ SOLN
8.0000 mg | Freq: Once | INTRAMUSCULAR | Status: AC
  Administered 2023-05-21: 8 mg via INTRAVENOUS
  Filled 2023-05-21: qty 4

## 2023-05-21 MED ORDER — SODIUM CHLORIDE 0.9% FLUSH
10.0000 mL | INTRAVENOUS | Status: DC | PRN
Start: 2023-05-21 — End: 2023-05-21
  Administered 2023-05-21: 10 mL

## 2023-05-21 MED ORDER — SODIUM CHLORIDE 0.9 % IV SOLN
1200.0000 mg | Freq: Once | INTRAVENOUS | Status: AC
  Administered 2023-05-21: 1200 mg via INTRAVENOUS
  Filled 2023-05-21: qty 20

## 2023-05-21 MED ORDER — SODIUM CHLORIDE 0.9% FLUSH
10.0000 mL | Freq: Once | INTRAVENOUS | Status: AC
  Administered 2023-05-21: 10 mL

## 2023-05-21 MED ORDER — HEPARIN SOD (PORK) LOCK FLUSH 100 UNIT/ML IV SOLN
500.0000 [IU] | Freq: Once | INTRAVENOUS | Status: AC | PRN
  Administered 2023-05-21: 500 [IU]

## 2023-05-21 MED ORDER — SODIUM CHLORIDE 0.9 % IV SOLN: Freq: Once | INTRAVENOUS | Status: AC

## 2023-05-21 NOTE — Progress Notes (Signed)
 [facility] [phone]) [phone]   [phone]  PROGRESS NOTE  Patient Care Team: Allwardt, Deleta Felix, PA-C as PCP - General (Physician Assistant) O'Neal, Cathay Clonts, MD as PCP - Cardiology (Cardiology) Janel Medford, MD (Inactive) as Attending Physician (Gastroenterology) Dr. Valentine Gasmen, MD as Consulting Physician (Orthopedic Surgery) Hazle Lites, MD as Consulting Physician (Orthopedic Surgery) Brian Campanile, MD (Inactive) as Consulting Physician (Neurology) Jhonny Moss, MD as Consulting Physician (Neurology) Myrle Aspen, [facility] (Inactive) (Pharmacist) [facility], [facility]   Hematological/Oncological History # Small Cell Lung Cancer, Extensive Stage 07/05/2020: CT abdomen for lower abdominal pain. New pulmonary nodular density noted 07/06/2020: CT chest showed 2.2 cm macrolobulated right lower lobe pulmonary nodule (favored) versus pathologically enlarged infrahilar lymph node 10/13/2020: PET CT scan performed, findings show 2 cm right lower lobe lung mass is hypermetabolic and consistent with primary lung neoplasm. Additionally found hypermetabolic 17 mm soft tissue lesion between the descending duodenum and the pancreatic head  10/19/2020: EGD to biopsy hypermetabolic lymph node. Biopsy results show small cell lung cancer 10/26/2020: establish care with Dr. [name]  11/13/2020: Cycle 1 Day 1 of Carbo/Etop/Atezolizumab  12/04/2020: Cycle 2 Day 1 of Carbo/Etop/Atezolizumab  11/22-11/25/2022: admitted for E. Coli bacteremia/sepsis. Start of Cycle 3 delayed. 01/02/2021: Cycle 3 Day 1 of Carbo/Etop/Atezolizumab  01/23/2021: Cycle 4 Day 1 of Carbo/Etop/Atezolizumab  02/22/2021: Cycle 5 Day 1 of Atezolizumab  Maintenance. Delayed start due to patient's COVID infection.  03/14/2021: Cycle 6 Day 1 of Atezolizumab  Maintenance 04/05/2021: Cycle 7 Day 1 of Atezolizumab  Maintenance 04/25/2021: Cycle 8 Day 1 of Atezolizumab   Maintenance 05/16/2021: Cycle 9 Day 1 of Atezolizumab  Maintenance 06/06/2021: Cycle 10 Day 1 of Atezolizumab  Maintenance 06/27/2021:  Cycle 11 Day 1 of Atezolizumab  Maintenance 07/18/2021: Cycle 12 Day 1 of Atezolizumab  Maintenance 08/08/2021: Cycle 13 Day 1 of Atezolizumab  Maintenance 08/29/2021: Cycle 14 Day 1 of Atezolizumab  Maintenance 09/19/2021: Cycle 15 Day 1 of Atezolizumab  Maintenance 10/10/2021: Cycle 16 Day 1 of Atezolizumab  Maintenance 10/31/2021: Cycle 17 Day 1 of Atezolizumab  Maintenance 11/21/2021: Cycle 18 Day 1 of Atezolizumab  Maintenance 12/12/2021: Cycle 19 Day 1 of Atezolizumab  Maintenance 01/02/2022: Cycle 20 Day 1 of Atezolizumab  Maintenance 01/23/2022: Cycle 21 Day 1 of Atezolizumab  Maintenance 02/13/2022: treatment HELD due to diarrhea.  03/06/2022: Cycle 22 Day 1 of Atezolizumab  Maintenance 03/27/2022: Cycle 23 Day 1 of Atezolizumab  Maintenance 04/17/2022: Cycle 24 Day 1 of Atezolizumab  Maintenance 05/08/2022:  Cycle 25 Day 1 of Atezolizumab  Maintenance (Held due to patient preference for treatment holiday) 05/29/2022: Cycle 25 Day 1 of Atezolizumab  Maintenance  06/19/2022: Cycle 26 Day 1 of Atezolizumab  Maintenance  07/12/2022: Cycle 27 Day 1 of Atezolizumab  Maintenance 07/31/2022: Cycle 28 Day 1 of Atezolizumab  Maintenance HELD per patient request for fatigue.  08/22/2022: Cycle 28 Day 1 of Atezolizumab  Maintenance 09/11/2022: Cycle 29 Day 1 of Atezolizumab  Maintenance 10/03/2022: Cycle 30 Day 1 of Atezolizumab  Maintenance 10/23/2022: Cycle 31 Day 1 of Atezolizumab  Maintenance 11/13/2022: Cycle 32 Day 1 of Atezolizumab  Maintenance 12/24/2022: Cycle 33 Day 1 of Atezolizumab  Maintenance 01/15/2023: Cycle 34 Day 1 of Atezolizumab  Maintenance 02/05/2023: Cycle 35 Day 1 of Atezolizumab  Maintenance 02/26/2023: Cycle 36 Day 1 of Atezolizumab  Maintenance 04/28/2023: Cycle 37 Day 1 of Atezolizumab  Maintenance 04/30/2023: Cycle 38 Day 1 of Atezolizumab  Maintenance 05/21/2023: Cycle 39 Day 1 of  Atezolizumab  Maintenance  Interval History:  [name] 68 y.o. female with medical history significant for extensive stage small cell lung cancer who presents for a follow up visit. The patient's last visit was on 04/30/2023.  She presents today to start cycle 39 of chemotherapy.   On exam today [name] reports she has had about 3 falls in the last week.  These have been mechanical falls and been provoked by tripping and stumbling.  She does have throw rugs which she has been tripping over.  She also notes that once that is what she got bursting in the morning and was moving towards the restroom.  Show she has not had any lightheadedness, dizziness, or shortness of breath.  She has not been falling due to weakness.  She has been falling because she has stumbled.  She notes that she would not like to pursue physical therapy at this time.  It was offered.  She notes that she otherwise has been well with no nausea, vomiting, or diarrhea.  She reports that she does feel little unwell after her treatments for about a week.  She is having some pain from her falls has been taking her usual hydrocodone  medications.  She also has been sleeping in her recliner which has caused some swelling of her lower extremities bilaterally.  She otherwise denies any fevers, chills, sweats, nausea vomiting or diarrhea.  A full 10 point ROS is otherwise negative.  MEDICAL HISTORY:  Past Medical History:  Diagnosis Date   Allergic rhinitis    Anemia    Anxiety    Chicken pox    Chronic back pain    COPD (chronic obstructive pulmonary disease) (HCC)    Depression    DM (diabetes mellitus) (HCC)    Essential hypertension    GERD (gastroesophageal reflux disease)    Headache    migraines   History of gastritis    EGD 2015   History of home oxygen  therapy    2 liters at hs last 6 months   Hyperlipidemia    Hypothyroidism    Migraines    Osteoarthritis    oa   Scoliosis    Small cell lung cancer (HCC)     Stage 4    SURGICAL HISTORY: Past Surgical History:  Procedure Laterality Date   APPENDECTOMY     1985   BIOPSY  07/24/2017   Procedure: BIOPSY;  Surgeon: Janel Medford, MD;  Location: [facility];  Service: Endoscopy;;   CARDIAC CATHETERIZATION N/A 10/31/2015   Procedure: Left Heart Cath and Coronary Angiography;  Surgeon: Arleen Lacer, MD;  Location: [facility] INVASIVE CV LAB;  Service: Cardiovascular;  Laterality: N/A;   CARPAL TUNNEL RELEASE Left    CARPAL TUNNEL RELEASE Right    CATARACT EXTRACTION W/PHACO Left 02/07/2023   Procedure: CATARACT EXTRACTION PHACO AND INTRAOCULAR LENS PLACEMENT (IOC);  Surgeon: Ardeth Krabbe, MD;  Location: [facility];  Service: Ophthalmology;  Laterality: Left;  CDE: 3.51   CATARACT EXTRACTION W/PHACO Right 02/21/2023   Procedure: CATARACT EXTRACTION PHACO AND INTRAOCULAR LENS PLACEMENT (IOC);  Surgeon: Ardeth Krabbe, MD;  Location: [facility];  Service: Ophthalmology;  Laterality: Right;  CDE: 2.94   CHOLECYSTECTOMY  late 1980's   COLONOSCOPY WITH PROPOFOL  N/A 07/24/2017   Procedure: COLONOSCOPY WITH PROPOFOL ;  Surgeon: Janel Medford, MD;  Location: [facility];  Service: Endoscopy;  Laterality: N/A;   ESOPHAGOGASTRODUODENOSCOPY N/A 07/24/2017   Procedure: ESOPHAGOGASTRODUODENOSCOPY (EGD);  Surgeon: Janel Medford, MD;  Location: [facility];  Service: Endoscopy;  Laterality: N/A;   ESOPHAGOGASTRODUODENOSCOPY (EGD) WITH PROPOFOL  N/A 10/19/2020   Procedure: ESOPHAGOGASTRODUODENOSCOPY (EGD) WITH PROPOFOL ;  Surgeon: Janel Medford, MD;  Location: [facility];  Service: Endoscopy;  Laterality: N/A;  FINE NEEDLE ASPIRATION N/A 10/19/2020   Procedure: FINE NEEDLE ASPIRATION (FNA) LINEAR;  Surgeon: Janel Medford, MD;  Location: [facility];  Service: Endoscopy;  Laterality: N/A;   GALLBLADDER SURGERY  1991   HIP CLOSED REDUCTION Right 01/08/2016   Procedure: CLOSED MANIPULATION HIP;  Surgeon: Orvan Blanch, MD;  Location: [facility];  Service:  Orthopedics;  Laterality: Right;   HIP CLOSED REDUCTION Right 01/19/2016   Procedure: ATTEMPTED CLOSED REDUCTION RIGHT HIP;  Surgeon: Amada Backer, MD;  Location: [facility];  Service: Orthopedics;  Laterality: Right;   HIP CLOSED REDUCTION Right 01/20/2016   Procedure: CLOSED REDUCTION RIGHT TOTAL HIP;  Surgeon: Claiborne Crew, MD;  Location: [facility];  Service: Orthopedics;  Laterality: Right;   HIP CLOSED REDUCTION Right 02/17/2016   Procedure: CLOSED REDUCTION RIGHT TOTAL HIP;  Surgeon: Adonica Hoose, MD;  Location: [facility];  Service: Orthopedics;  Laterality: Right;   HIP CLOSED REDUCTION Right 02/28/2016   Procedure: CLOSED REDUCTION HIP;  Surgeon: Janeth Medicus, MD;  Location: [facility];  Service: Orthopedics;  Laterality: Right;   IR IMAGING GUIDED PORT INSERTION  11/01/2020   POLYPECTOMY  07/24/2017   Procedure: POLYPECTOMY;  Surgeon: Janel Medford, MD;  Location: [facility];  Service: Endoscopy;;   TONSILLECTOMY     TOTAL ABDOMINAL HYSTERECTOMY     1985, with 1 ovary removed and 2 nd ovary removed 2003   TOTAL HIP ARTHROPLASTY Right    Original surgery 2006 with revision 2010   TOTAL HIP REVISION Right 01/01/2016   Procedure: TOTAL HIP REVISION;  Surgeon: Claiborne Crew, MD;  Location: [facility];  Service: Orthopedics;  Laterality: Right;   TOTAL HIP REVISION Right 03/02/2016   Procedure: TOTAL HIP REVISION;  Surgeon: Claiborne Crew, MD;  Location: [facility];  Service: Orthopedics;  Laterality: Right;   TOTAL HIP REVISION Right 09/02/2016   Procedure: Right hip constrained liner- posterior;  Surgeon: Claiborne Crew, MD;  Location: [facility];  Service: Orthopedics;  Laterality: Right;   ULNAR NERVE TRANSPOSITION Right    UPPER ESOPHAGEAL ENDOSCOPIC ULTRASOUND (EUS) N/A 10/19/2020   Procedure: UPPER ESOPHAGEAL ENDOSCOPIC ULTRASOUND (EUS);  Surgeon: Janel Medford, MD;  Location: [facility];  Service: Endoscopy;  Laterality: N/A;  periduodenal lesion    SOCIAL HISTORY: Social History    Socioeconomic History   Marital status: Married    Spouse name: Not on file   Number of children: 2   Years of education: Not on file   Highest education level: 12th grade  Occupational History   Occupation: disabled   Occupation: disabled  Tobacco Use   Smoking status: Every Day    Current packs/day: 2.00    Average packs/day: 2.0 packs/day for 46.0 years (92.0 ttl pk-yrs)    Types: Cigarettes   Smokeless tobacco: Never   Tobacco comments:    2 packs of cigarettes smoked daily 12/13/21- declines smoking cessation  Vaping Use   Vaping status: Never Used  Substance and Sexual Activity   Alcohol use: No   Drug use: No   Sexual activity: Not Currently    Partners: Male  Other Topics Concern   Not on file  Social History Narrative   Right handed    Caffeine~ 2 cups per day    Lives at home with husband (strained relationship)   Primary caretaker for disabled brother who had aneurism   Daughter died 2018/07/20   Social Drivers of Health   Financial Resource Strain: Low Risk  (05/20/2023)   Overall  Financial Resource Strain (CARDIA)    Difficulty of Paying Living Expenses: Not very hard  Food Insecurity: Food Insecurity Present (05/20/2023)   Hunger Vital Sign    Worried About Running Out of Food in the Last Year: Sometimes true    Ran Out of Food in the Last Year: Sometimes true  Transportation Needs: No Transportation Needs (05/20/2023)   PRAPARE - Administrator, Civil Service (Medical): No    Lack of Transportation (Non-Medical): No  Physical Activity: Inactive (05/20/2023)   Exercise Vital Sign    Days of Exercise per Week: 0 days    Minutes of Exercise per Session: 0 min  Stress: No Stress Concern Present (05/20/2023)   [employer] of Occupational Health - Occupational Stress Questionnaire    Feeling of Stress : Only a little  Recent Concern: Stress - Stress Concern Present (03/31/2023)   [employer] of Occupational Health - Occupational Stress  Questionnaire    Feeling of Stress : Very much  Social Connections: Moderately Integrated (05/20/2023)   Social Connection and Isolation Panel [NHANES]    Frequency of Communication with Friends and Family: More than three times a week    Frequency of Social Gatherings with Friends and Family: More than three times a week    Attends Religious Services: 1 to 4 times per year    Active Member of [employer] or Organizations: No    Attends Banker Meetings: Never    Marital Status: Married  Catering manager Violence: Not At Risk (05/20/2023)   Humiliation, Afraid, Rape, and Kick questionnaire    Fear of Current or Ex-Partner: No    Emotionally Abused: No    Physically Abused: No    Sexually Abused: No    FAMILY HISTORY: Family History  Problem Relation Age of Onset   COPD Mother    Heart disease Mother    Lung disease Father        Asbestosis   Heart attack Father    Heart disease Father    Cerebral aneurysm Brother    Aneurysm Brother        Brain   Drug abuse Daughter    Epilepsy Son    Alcohol abuse Son    Drug abuse Son    Arthritis Maternal Grandmother    Heart disease Maternal Grandmother    Asthma Maternal Grandfather    Cancer Maternal Grandfather    Arthritis Paternal Grandmother    Heart disease Paternal Grandmother    Stroke Paternal Grandmother    Early death Paternal Grandfather    Heart disease Paternal Grandfather     ALLERGIES:  is allergic to nisoldipine, metformin  and related, nsaids, pregabalin, wellbutrin  [bupropion ], aleve [naproxen sodium], penicillins, and sulfonamide derivatives.  MEDICATIONS:  Current Outpatient Medications  Medication Sig Dispense Refill   albuterol  (PROAIR  HFA) 108 (90 Base) MCG/ACT inhaler 2 puffs every 4 hours as needed only  if your can't catch your breath 18 g 5   ALPRAZolam  (XANAX ) 1 MG tablet TAKE 1 TABLET BY MOUTH THREE TIMES DAILY AS NEEDED FOR ANXIETY 90 tablet 2   ARIPiprazole  (ABILIFY ) 5 MG tablet Take 1  tablet (5 mg total) by mouth daily. 30 tablet 5   atorvastatin  (LIPITOR) 20 MG tablet TAKE 1 TABLET BY MOUTH AT BEDTIME 90 tablet 1   benzonatate  (TESSALON ) 100 MG capsule TAKE ONE CAPSULE BY MOUTH THREE TIMES DAILY 90 capsule 1   calcipotriene  (DOVONOX) 0.005 % cream Apply to affected area bid when on break  from steroid cream 120 g 3   cyclobenzaprine  (FLEXERIL ) 10 MG tablet Take 10 mg by mouth 3 (three) times daily as needed.     dicyclomine  (BENTYL ) 10 MG capsule Take 1 capsule (10 mg total) by mouth every 8 (eight) hours as needed for spasms. Please keep your May appointment for further refills. Thank you. 60 capsule 1   diphenoxylate -atropine  (LOMOTIL ) 2.5-0.025 MG tablet TAKE 1 TABLET BY MOUTH FOUR TIMES DAILY AS NEEDED FOR diarrhea OR loose stools 60 tablet 0   DULoxetine  (CYMBALTA ) 60 MG capsule TAKE TWO CAPSULES BY MOUTH EVERY DAY (Patient taking differently: Take 60 mg by mouth daily. Patient states that she adjusted her dose herself.) 180 capsule 1   FEROSUL 325 (65 Fe) MG tablet TAKE 1 TABLET BY MOUTH DAILY WITH BREAKFAST 30 tablet 3   fluconazole  (DIFLUCAN ) 150 MG tablet Take 150 mg by mouth once.     fluticasone  (FLONASE ) 50 MCG/ACT nasal spray INSTILL 2 SPRAYS IN EACH NOSTRIL EVERY DAY 16 g 6   Fluticasone -Umeclidin-Vilant (TRELEGY ELLIPTA ) 200-62.5-25 MCG/ACT AEPB Inhale 1 puff into the lungs daily. 60 each 11   furosemide  (LASIX ) 20 MG tablet TAKE 1 TABLET BY MOUTH TWICE DAILY, IF SWELLING IMPROVES CAN HOLD MEDICATION 90 tablet 0   HYDROcodone -acetaminophen  (NORCO) 10-325 MG tablet TAKE 1 TABLET BY MOUTH EVERY 4 HOURS AS NEEDED 150 tablet 0   hydrOXYzine  (ATARAX ) 10 MG tablet TAKE 1 TABLET BY MOUTH THREE TIMES DAILY AS NEEDED FOR ITCHING 30 tablet 2   levothyroxine  (SYNTHROID ) 137 MCG tablet Take 1 tablet (137 mcg total) by mouth daily before breakfast. 90 tablet 3   loratadine  (CLARITIN ) 10 MG tablet Take 10 mg by mouth daily.     meclizine  (ANTIVERT ) 12.5 MG tablet TAKE 1 TABLET  BY MOUTH THREE TIMES DAILY AS NEEDED FOR DIZZINESS 30 tablet 1   meloxicam  (MOBIC ) 15 MG tablet TAKE 1 TABLET BY MOUTH EVERY DAY 30 tablet 2   OLANZapine  (ZYPREXA ) 10 MG tablet Take 1 tablet (10 mg total) by mouth at bedtime. 30 tablet 2   omeprazole  (PRILOSEC) 40 MG capsule TAKE ONE CAPSULE BY MOUTH TWICE DAILY 180 capsule 1   ondansetron  (ZOFRAN ) 8 MG tablet TAKE 1 TABLET BY MOUTH TWICE DAILY 60 tablet 1   permethrin  (ELIMITE ) 5 % cream      potassium chloride  SA (KLOR-CON  M) 20 MEQ tablet TAKE 1 TABLET BY MOUTH TWICE DAILY (Patient taking differently: Take 20 mEq by mouth daily. Patient states that she adjusted the dose herself.) 60 tablet 2   prochlorperazine  (COMPAZINE ) 10 MG tablet Take 1 tablet (10 mg total) by mouth every 6 (six) hours as needed for nausea or vomiting. 30 tablet 0   Rimegepant Sulfate (NURTEC) 75 MG TBDP Take 1 tablet (75 mg total) by mouth daily as needed (for migraine-). Take within 15 minutes of initial symptoms 8 tablet 5   topiramate  (TOPAMAX ) 100 MG tablet Take 1 tablet (100 mg total) by mouth at bedtime. Changed to 100 mg tabs- for ease 90 tablet 1   triamcinolone  ointment (KENALOG ) 0.5 % Apply 1 Application topically 3 (three) times daily. Apply thin-layer on affected areas, no more than 2 weeks consistently. 60 g 2   triamterene -hydrochlorothiazide  (MAXZIDE -25) 37.5-25 MG tablet Take 1 tablet by mouth daily.     No current facility-administered medications for this visit.    REVIEW OF SYSTEMS:   Constitutional: ( - ) fevers, ( - )  chills , ( - ) night sweats Eyes: ( - )  blurriness of vision, ( - ) double vision, ( - ) watery eyes Ears, nose, mouth, throat, and face: ( - ) mucositis, ( - ) sore throat Respiratory: ( - ) cough, ( -) dyspnea, ( - ) wheezes Cardiovascular: ( - ) palpitation, ( - ) chest discomfort, ( - ) lower extremity swelling Gastrointestinal:  ( +) nausea, ( - ) heartburn, ( - ) change in bowel habits Skin: ( - ) abnormal skin  rashes Lymphatics: ( - ) new lymphadenopathy, ( - ) easy bruising Neurological: (+ ) numbness, ( - ) tingling, ( - ) new weaknesses Behavioral/Psych: ( - ) mood change, ( - ) new changes  All other systems were reviewed with the patient and are negative.  PHYSICAL EXAMINATION: ECOG PERFORMANCE STATUS: 1 - Symptomatic but completely ambulatory  There were no vitals filed for this visit. There were no vitals filed for this visit.  GENERAL: Well-appearing middle-age Caucasian female, alert, no distress and comfortable SKIN: skin color, texture, turgor are normal, no rashes or significant lesions EYES: conjunctiva are pink and non-injected, sclera clear LUNGS:  normal breathing effort. Diffuse wheezing hear on ausculation.  HEART: regular rate & rhythm and no murmurs. Mild bilateral lower extremity edema Musculoskeletal: no cyanosis of digits and no clubbing  PSYCH: alert & oriented x 3, fluent speech NEURO: no focal motor/sensory deficits  LABORATORY DATA:  I have reviewed the data as listed    Latest Ref Rng & Units 05/21/2023    9:47 AM 04/30/2023   10:22 AM 04/10/2023   11:02 AM  CBC  WBC 4.0 - 10.5 K/uL 7.1  7.2  9.0   Hemoglobin 12.0 - 15.0 g/dL 81.1  91.4  78.2   Hematocrit 36.0 - 46.0 % 40.1  41.5  41.1   Platelets 150 - 400 K/uL 169  174  188        Latest Ref Rng & Units 05/21/2023    9:47 AM 04/30/2023   10:22 AM 04/10/2023   11:02 AM  CMP  Glucose 70 - 99 mg/dL 956  213  086   BUN 8 - 23 mg/dL 12  9  13    Creatinine 0.44 - 1.00 mg/dL 5.78  4.69  6.29   Sodium 135 - 145 mmol/L 136  132  128   Potassium 3.5 - 5.1 mmol/L 3.7  3.4  3.6   Chloride 98 - 111 mmol/L 103  99  95   CO2 22 - 32 mmol/L 28  27  28    Calcium  8.9 - 10.3 mg/dL 8.4  8.6  8.1   Total Protein 6.5 - 8.1 g/dL 6.0  6.1  6.5   Total Bilirubin 0.0 - 1.2 mg/dL 0.4  0.5  0.4   Alkaline Phos 38 - 126 U/L 76  67  66   AST 15 - 41 U/L 25  15  14    ALT 0 - 44 U/L 23  9  11      No results found for:  "MPROTEIN" Lab Results  Component Value Date   KPAFRELGTCHN 0.75 06/30/2014   LAMBDASER 3.78 (H) 06/30/2014   KAPLAMBRATIO 0.20 (L) 06/30/2014     RADIOGRAPHIC STUDIES: No results found.   ASSESSMENT & PLAN [name] 68 y.o. female with medical history significant for extensive stage small cell lung cancer who presents for a follow up visit.   After review of the labs, review of the records, and discussion with the patient the patients findings are most consistent with extensive  stage small cell lung cancer with metastasis from the right lower lobe to the lymph nodes of the abdomen.  At this time we will pursue triple therapy with carboplatin , etoposide , and atezolizumab .  After 4 cycles we will convert to maintenance atezolizumab  alone.  We previously discussed the risks and benefits of this therapy and the patient was in agreement to proceed with this treatment.  The treatment of choice consist of carboplatin , etoposide , and atezolizumab .  The regimen consists of carboplatin  AUC of 5 IV on day 1, etoposide  100 mg per metered squared IV on day 1, 2, and 3 and atezolizumab  1200 mg on day 1.  This continues for 21-day cycles.  After 4 cycles the patient proceeds with atezolizumab  maintenance therapy alone.    # Small Cell Lung Cancer, Extensive Stage -- MRI of the brain shows no evidence of intracranial spread --Findings are currently consistent with metastatic small cell lung cancer with metastatic spread to the lymph nodes of the abdomen -- Started carboplatin , etoposide , and atezolizumab  on 11/13/2020. Transitioned to maintenance Atezolizumab  on 02/22/2021.  Plan: --today is Cycle 39 Day 1 of Atezolizumab  maintenance.  --labs today were reviewed and adequate for treatment. Hgb 13.4, MCV 92.6, platelets 169, white blood cell 7.1. Creatinine stable at 1.01. LFTs in range. Thyroid  panel normal. -- Most recent CT CAP from 04/17/2023 which shows no evidence of recurrence. Repeat scan due  in June 2025.   --Proceed with treatment without any dose modifications.  --RTC in 3 weeks for a follow up before Cycle 40  # Early Cirrhosis --noted on MR abdomen from 05/22/2022. US  with elastography performed on 06/18/2022. --continue to follow with GI.   #Right renal lesion: --Noted to be benign on most recent MRI of the abdomen.  #Diarrhea-improved --stool sent for C. Diff and GI pathology panel, all negative --patient following with GI and found to have pancreatic insufficiency and started on creon  with improvement of symptoms.   #Nausea: --Symptoms improved with zofran  and olanzapine .  --Added IV zofran  to infusion premeds for better symptom control.   #Hypokalemia:  --currently taking potassium chloride  20 meq BID.   --potassium level is 3.7 today. Continue PO supplementation.   #Lower extremity edema --Currently takes lasix  20 mg 1-2 times a day. --Stable. Continue to monitor.   #Neuropathy involving fingers/feet: --Patient currently takes Cymbalta    #Supportive Care -- chemotherapy education complete -- port placed -- zofran  8mg  q8H PRN and compazine  10mg  PO q6H for nausea -- EMLA  cream for port  Orders Placed This Encounter  Procedures   TSH    Standing Status:   Future    Expected Date:   07/02/2023    Expiration Date:   07/01/2024   T4    Standing Status:   Future    Expected Date:   07/02/2023    Expiration Date:   07/01/2024   CBC with Differential (Cancer Center Only)    Standing Status:   Future    Expected Date:   07/02/2023    Expiration Date:   07/01/2024   CMP (Cancer Center only)    Standing Status:   Future    Expected Date:   07/02/2023    Expiration Date:   07/01/2024   TSH    Standing Status:   Future    Expected Date:   07/23/2023    Expiration Date:   07/22/2024   T4    Standing Status:   Future    Expected Date:   07/23/2023  Expiration Date:   07/22/2024   CBC with Differential (Cancer Center Only)    Standing Status:   Future    Expected  Date:   07/23/2023    Expiration Date:   07/22/2024   CMP (Cancer Center only)    Standing Status:   Future    Expected Date:   07/23/2023    Expiration Date:   07/22/2024   TSH    Standing Status:   Future    Expected Date:   08/13/2023    Expiration Date:   08/12/2024   T4    Standing Status:   Future    Expected Date:   08/13/2023    Expiration Date:   08/12/2024   CBC with Differential (Cancer Center Only)    Standing Status:   Future    Expected Date:   08/13/2023    Expiration Date:   08/12/2024   CMP (Cancer Center only)    Standing Status:   Future    Expected Date:   08/13/2023    Expiration Date:   08/12/2024   All questions were answered. The patient knows to call the clinic with any problems, questions or concerns.  I have spent a total of 30 minutes minutes of face-to-face and non-face-to-face time, preparing to see the patient, performing a medically appropriate examination, counseling and educating the patient, ordering medications/tests, documenting clinical information in the electronic health record, and care coordination.   Rogerio Clay, MD Department of Hematology/Oncology [facility] at [facility] Phone: [phone] Pager: [phone] Email: [email or web address]  05/21/2023 11:11 AM

## 2023-05-21 NOTE — Patient Instructions (Signed)
 [facility] - A DEPT OF [facility]  Discharge Instructions: Thank you for choosing [facility] to provide your oncology and hematology care.   If you have a lab appointment with the Cancer Center, please go directly to the Cancer Center and check in at the registration area.   Wear comfortable clothing and clothing appropriate for easy access to any Portacath or PICC line.   We strive to give you quality time with your provider. You may need to reschedule your appointment if you arrive late (15 or more minutes).  Arriving late affects you and other patients whose appointments are after yours.  Also, if you miss three or more appointments without notifying the office, you may be dismissed from the clinic at the provider's discretion.      For prescription refill requests, have your pharmacy contact our office and allow 72 hours for refills to be completed.    Today you received the following chemotherapy and/or immunotherapy agents: Tecentriq      To help prevent nausea and vomiting after your treatment, we encourage you to take your nausea medication as directed.  BELOW ARE SYMPTOMS THAT SHOULD BE REPORTED IMMEDIATELY: *FEVER GREATER THAN 100.4 F (38 C) OR HIGHER *CHILLS OR SWEATING *NAUSEA AND VOMITING THAT IS NOT CONTROLLED WITH YOUR NAUSEA MEDICATION *UNUSUAL SHORTNESS OF BREATH *UNUSUAL BRUISING OR BLEEDING *URINARY PROBLEMS (pain or burning when urinating, or frequent urination) *BOWEL PROBLEMS (unusual diarrhea, constipation, pain near the anus) TENDERNESS IN MOUTH AND THROAT WITH OR WITHOUT PRESENCE OF ULCERS (sore throat, sores in mouth, or a toothache) UNUSUAL RASH, SWELLING OR PAIN  UNUSUAL VAGINAL DISCHARGE OR ITCHING   Items with * indicate a potential emergency and should be followed up as soon as possible or go to the Emergency Department if any problems should occur.  Please show the CHEMOTHERAPY ALERT CARD or IMMUNOTHERAPY  ALERT CARD at check-in to the Emergency Department and triage nurse.  Should you have questions after your visit or need to cancel or reschedule your appointment, please contact [facility] - A DEPT OF [facility]  Dept: [phone]  and follow the prompts.  Office hours are 8:00 a.m. to 4:30 p.m. Monday - Friday. Please note that voicemails left after 4:00 p.m. may not be returned until the following business day.  We are closed weekends and major holidays. You have access to a nurse at all times for urgent questions. Please call the main number to the clinic Dept: [phone] and follow the prompts.   For any non-urgent questions, you may also contact your provider using MyChart. We now offer e-Visits for anyone 62 and older to request care online for non-urgent symptoms. For details visit [email or web address].   Also download the MyChart app! Go to the app store, search "MyChart", open the app, select [place], and log in with your MyChart username and password.

## 2023-05-22 ENCOUNTER — Encounter (HOSPITAL_BASED_OUTPATIENT_CLINIC_OR_DEPARTMENT_OTHER): Payer: Self-pay | Admitting: Pulmonary Disease

## 2023-05-22 ENCOUNTER — Ambulatory Visit (HOSPITAL_BASED_OUTPATIENT_CLINIC_OR_DEPARTMENT_OTHER): Payer: 59 | Admitting: Pulmonary Disease

## 2023-05-22 ENCOUNTER — Other Ambulatory Visit (HOSPITAL_BASED_OUTPATIENT_CLINIC_OR_DEPARTMENT_OTHER): Payer: Self-pay

## 2023-05-22 ENCOUNTER — Ambulatory Visit (HOSPITAL_BASED_OUTPATIENT_CLINIC_OR_DEPARTMENT_OTHER)

## 2023-05-22 ENCOUNTER — Encounter: Payer: Self-pay | Admitting: Hematology and Oncology

## 2023-05-22 VITALS — BP 128/76 | HR 98 | Ht 62.25 in | Wt 178.6 lb

## 2023-05-22 DIAGNOSIS — G4736 Sleep related hypoventilation in conditions classified elsewhere: Secondary | ICD-10-CM | POA: Diagnosis not present

## 2023-05-22 DIAGNOSIS — C3490 Malignant neoplasm of unspecified part of unspecified bronchus or lung: Secondary | ICD-10-CM | POA: Diagnosis not present

## 2023-05-22 DIAGNOSIS — J449 Chronic obstructive pulmonary disease, unspecified: Secondary | ICD-10-CM

## 2023-05-22 DIAGNOSIS — B370 Candidal stomatitis: Secondary | ICD-10-CM

## 2023-05-22 DIAGNOSIS — W19XXXA Unspecified fall, initial encounter: Secondary | ICD-10-CM

## 2023-05-22 DIAGNOSIS — R0789 Other chest pain: Secondary | ICD-10-CM | POA: Diagnosis not present

## 2023-05-22 DIAGNOSIS — R079 Chest pain, unspecified: Secondary | ICD-10-CM | POA: Diagnosis not present

## 2023-05-22 DIAGNOSIS — Z452 Encounter for adjustment and management of vascular access device: Secondary | ICD-10-CM | POA: Diagnosis not present

## 2023-05-22 DIAGNOSIS — F17210 Nicotine dependence, cigarettes, uncomplicated: Secondary | ICD-10-CM | POA: Diagnosis not present

## 2023-05-22 DIAGNOSIS — I700 Atherosclerosis of aorta: Secondary | ICD-10-CM | POA: Diagnosis not present

## 2023-05-22 DIAGNOSIS — J441 Chronic obstructive pulmonary disease with (acute) exacerbation: Secondary | ICD-10-CM | POA: Diagnosis not present

## 2023-05-22 LAB — T4: T4, Total: 9.4 ug/dL (ref 4.5–12.0)

## 2023-05-22 MED ORDER — AZITHROMYCIN 250 MG PO TABS
ORAL_TABLET | ORAL | 0 refills | Status: DC
Start: 2023-05-22 — End: 2023-06-11
  Filled 2023-05-22: qty 6, 5d supply, fill #0

## 2023-05-22 MED ORDER — ANORO ELLIPTA 62.5-25 MCG/ACT IN AEPB: 1.0000 | INHALATION_SPRAY | Freq: Every day | RESPIRATORY_TRACT | 11 refills | Status: AC

## 2023-05-22 MED ORDER — NYSTATIN 100000 UNIT/ML MT SUSP
5.0000 mL | Freq: Three times a day (TID) | OROMUCOSAL | 0 refills | Status: DC | PRN
Start: 2023-05-22 — End: 2023-09-08
  Filled 2023-05-22: qty 180, 12d supply, fill #0

## 2023-05-22 MED ORDER — PREDNISONE 10 MG PO TABS
ORAL_TABLET | ORAL | 0 refills | Status: AC
  Filled 2023-05-22: qty 20, 8d supply, fill #0

## 2023-05-22 NOTE — Progress Notes (Signed)
 @Patient  ID: [name], female    DOB: 17-Aug-1955, 68 y.o.   MRN: [id number]  Chief Complaint  Patient presents with   Follow-up    Nocturnal hypoxemia    [name] is a 68 year old female active smoker with COPD and chronic respiratory failure, scoliosis and DDD who presents for follow-up.  Since our last visit she was started on Trelegy. She reports shortness of breath is occurring more frequently and seems to occur in morning and late evenings. Wheezing and coughing with yellow-gray sputum are stable. Reports upper chest congestion. She has been checking her oxygen  84-88% in the morning. She reports confusion in the mornings.  At baseline, she has shortness of breath with talking and short distances. She is able to climb stairs one step at a time and goes slow pace. Her activity is limited due to back and bilateral hip pain s/p multiple hip surgeries. Endorses wheezing daily. Has chronic cough with some yellow-gray sputum production. She is not compliant with her home oxygen  for > 1 year due to difficulty tolerate the tubing. She has tried Spiriva  which she tolerated however insurance did not pay for it. Nebulizers were tolerable however the setup and time spent on the machine was cumbersome.  11/10/20 Since our last visit, she was diagnosed with small cell carcinoma of lung confirmed via EUS. She is scheduled for treatment starting 11/13/20.   She reports she is coughing with chest congestion. She has decreased her cigarettes from 2.5 ppd cigarettes to 1.5 ppd. Denies wheezing. She is compliant with her Trelegy. Symptoms worsen at night. She uses her rescue inhaler 2-3 times a week.  04/10/21 Since our last visit she has completed chemotherapy and now on IT. Has some nausea. She is compliant with her Trelegy but has run out due to cost (>$100). She has shortness of breath and cough off the inhaler. She is compliant with nocturnal oxygen  and currently happy with her current  insurance company. She continues to smoke 2 ppd due to home stressors.  03/08/22 Since our last visit she has been on Atezolizumab  maintenance for extensive stage small cell lung cancer. This is currently held due to diarrhea. Since our last visit she is on Breztri . Uses albuterol  three times a month. Reports congestion in chest, shortness of breath and daily wheeze. Does not perform regular activity. She reports active smoking 2 ppd. Not ready to quit. She is not compliant with her oxygen  at night. She feels it is dangerous to use because the tubing catches on her neck. Has to use her walker and worried this will interfere it.  09/13/22 Since our last visit she was prescribed Breztri  but changed to Trelegy 200 on 06/18/22. Using albuterol  three times a week. She no longer wears oxygen  at night and felt like she was choking on it. She reports that she gets choked while swallowing for 6- 8 months. She reports feeling short of breath with frequent coughing during meals especially. She has even slowed down her eating more than usual.   02/17/23 Since our last visit she has been on Trelegy. Using albuterol  once a week for shortness of breath and productive coughing. Uses robitussin often. Continues to smoke 2ppd and not interested in quitting. No exacerbations. Present with friend - previously friends with daughter.  05/22/23 Since our last visit she has had good days and bad days. Developed thrush due Trelegy use and has not restarted. Using albuterol  up to 2 times a day due to  shortness of breath associated with wheezing and cough. Cough is productive. Also had recent fall when tripping on her pants followed by hitting the coffee table on right side. Has bruising that also limits her ability to cough.  Social History: Active smoker Daughter passed from OD two years ago Currently has grandson living with her, stressor  Past Medical History:  Diagnosis Date   Allergic rhinitis    Anemia    Anxiety     Chicken pox    Chronic back pain    COPD (chronic obstructive pulmonary disease) (HCC)    Depression    DM (diabetes mellitus) (HCC)    Essential hypertension    GERD (gastroesophageal reflux disease)    Headache    migraines   History of gastritis    EGD 2015   History of home oxygen  therapy    2 liters at hs last 6 months   Hyperlipidemia    Hypothyroidism    Migraines    Osteoarthritis    oa   Scoliosis    Small cell lung cancer (HCC)    Stage 4    Outpatient Medications Prior to Visit  Medication Sig Dispense Refill   albuterol  (PROAIR  HFA) 108 (90 Base) MCG/ACT inhaler 2 puffs every 4 hours as needed only  if your can't catch your breath 18 g 5   ALPRAZolam  (XANAX ) 1 MG tablet TAKE 1 TABLET BY MOUTH THREE TIMES DAILY AS NEEDED FOR ANXIETY 90 tablet 2   ARIPiprazole  (ABILIFY ) 5 MG tablet Take 1 tablet (5 mg total) by mouth daily. 30 tablet 5   atorvastatin  (LIPITOR) 20 MG tablet TAKE 1 TABLET BY MOUTH AT BEDTIME 90 tablet 1   benzonatate  (TESSALON ) 100 MG capsule TAKE ONE CAPSULE BY MOUTH THREE TIMES DAILY 90 capsule 1   calcipotriene  (DOVONOX) 0.005 % cream Apply to affected area bid when on break from steroid cream 120 g 3   cyclobenzaprine  (FLEXERIL ) 10 MG tablet Take 10 mg by mouth 3 (three) times daily as needed.     dicyclomine  (BENTYL ) 10 MG capsule Take 1 capsule (10 mg total) by mouth every 8 (eight) hours as needed for spasms. Please keep your May appointment for further refills. Thank you. 60 capsule 1   diphenoxylate -atropine  (LOMOTIL ) 2.5-0.025 MG tablet TAKE 1 TABLET BY MOUTH FOUR TIMES DAILY AS NEEDED FOR diarrhea OR loose stools 60 tablet 0   DULoxetine  (CYMBALTA ) 60 MG capsule TAKE TWO CAPSULES BY MOUTH EVERY DAY (Patient taking differently: Take 60 mg by mouth daily. Patient states that she adjusted her dose herself.) 180 capsule 1   FEROSUL 325 (65 Fe) MG tablet TAKE 1 TABLET BY MOUTH DAILY WITH BREAKFAST 30 tablet 3   fluconazole  (DIFLUCAN ) 150 MG tablet  Take 150 mg by mouth once.     fluticasone  (FLONASE ) 50 MCG/ACT nasal spray INSTILL 2 SPRAYS IN EACH NOSTRIL EVERY DAY 16 g 6   furosemide  (LASIX ) 20 MG tablet TAKE 1 TABLET BY MOUTH TWICE DAILY, IF SWELLING IMPROVES CAN HOLD MEDICATION 90 tablet 0   HYDROcodone -acetaminophen  (NORCO) 10-325 MG tablet TAKE 1 TABLET BY MOUTH EVERY 4 HOURS AS NEEDED 150 tablet 0   hydrOXYzine  (ATARAX ) 10 MG tablet TAKE 1 TABLET BY MOUTH THREE TIMES DAILY AS NEEDED FOR ITCHING 30 tablet 2   levothyroxine  (SYNTHROID ) 137 MCG tablet Take 1 tablet (137 mcg total) by mouth daily before breakfast. 90 tablet 3   loratadine  (CLARITIN ) 10 MG tablet Take 10 mg by mouth daily.  meclizine  (ANTIVERT ) 12.5 MG tablet TAKE 1 TABLET BY MOUTH THREE TIMES DAILY AS NEEDED FOR DIZZINESS 30 tablet 1   meloxicam  (MOBIC ) 15 MG tablet TAKE 1 TABLET BY MOUTH EVERY DAY 30 tablet 2   OLANZapine  (ZYPREXA ) 10 MG tablet Take 1 tablet (10 mg total) by mouth at bedtime. 30 tablet 2   omeprazole  (PRILOSEC) 40 MG capsule TAKE ONE CAPSULE BY MOUTH TWICE DAILY 180 capsule 1   ondansetron  (ZOFRAN ) 8 MG tablet TAKE 1 TABLET BY MOUTH TWICE DAILY 60 tablet 1   permethrin  (ELIMITE ) 5 % cream      potassium chloride  SA (KLOR-CON  M) 20 MEQ tablet TAKE 1 TABLET BY MOUTH TWICE DAILY (Patient taking differently: Take 20 mEq by mouth daily. Patient states that she adjusted the dose herself.) 60 tablet 2   prochlorperazine  (COMPAZINE ) 10 MG tablet Take 1 tablet (10 mg total) by mouth every 6 (six) hours as needed for nausea or vomiting. 30 tablet 0   Rimegepant Sulfate (NURTEC) 75 MG TBDP Take 1 tablet (75 mg total) by mouth daily as needed (for migraine-). Take within 15 minutes of initial symptoms 8 tablet 5   topiramate  (TOPAMAX ) 100 MG tablet Take 1 tablet (100 mg total) by mouth at bedtime. Changed to 100 mg tabs- for ease 90 tablet 1   triamcinolone  ointment (KENALOG ) 0.5 % Apply 1 Application topically 3 (three) times daily. Apply thin-layer on affected  areas, no more than 2 weeks consistently. 60 g 2   triamterene -hydrochlorothiazide  (MAXZIDE -25) 37.5-25 MG tablet Take 1 tablet by mouth daily.     Fluticasone -Umeclidin-Vilant (TRELEGY ELLIPTA ) 200-62.5-25 MCG/ACT AEPB Inhale 1 puff into the lungs daily. 60 each 11   No facility-administered medications prior to visit.   Review of Systems  Constitutional:  Negative for chills, diaphoresis, fever, malaise/fatigue and weight loss.  HENT:  Negative for congestion.   Respiratory:  Positive for cough, sputum production, shortness of breath and wheezing. Negative for hemoptysis.   Cardiovascular:  Negative for chest pain, palpitations and leg swelling.    BP 128/76   Pulse 98   Ht 5' 2.25" (1.581 m)   Wt 178 lb 9.6 oz (81 kg)   SpO2 95%   BMI 32.40 kg/m   Physical Exam: General: Well-appearing, no acute distress HENT: [place], AT Eyes: EOMI, no scleral icterus Respiratory: Bilateral rhonchi.  No crackles, wheezing or rales Cardiovascular: RRR, -M/R/G, no JVD Extremities:-Edema,-tenderness Neuro: AAO x4, CNII-XII grossly intact Psych: Normal mood, normal affect  Data Reviewed:  PFTs: 05/26/17 FVC 2.38 (72%) FEV1 1.6 (63%) Ratio 63 TLC 105% RV 141% DLCO 55% Interpretation: Moderate obstructive defect with significant bronchodilator response in FEV1. With presence and airtrapping and reduced gas exchange, findings are consistent with emphysema.  Labs: - Allergy  profile 03/18/17 >  Eos 0.3 /  IgE  4 neg RAST  Alpha-1 December 05, 2017 normal, MM, 174  Chest imaging: HRCT chest 12/24/2017-no ILD, mild to moderate emphysema CT A/P 07/05/20- right infrahilar enlargement vs overlaying right lower lobe lung nodule. Moderate stool in colon. CT Angio Chest Aort 07/26/20 Macrolobulated RLL nodule vs enlarged right infrahilar lymph node with punctate satellite nodule, paraseptal emphysema CT Coronary 09/21/20 - Macrolobulated RLLL 2.1 x 1.5 cm lung nodule with adjacent 4mm nodule PET 10/13/20 -  Hypermetabolic 2cm RLL lesion of the lung and 17mm soft tissue between descending duodenum and pancreatic head, emphysema MR Brain 11/04/20 - No metastatic disease CT CAP 01/17/21 - Resolution of RLL mass. Mild residual soft tissue of pancreaticoduodenol groove.  Close follow-up recommended CT CAP 01/22/22 - No evidence of small cell lung cancer recurrence CT CAP 07/26/22 - No evidence of small cell lung cancer recurrence, sigmoid diverticulosis, mildly coarse contour of liver suggestive of cirrhosis, emphysema CT CAP 01/06/23 - No evidence of small cell lung cancer recurrence CXR 05/22/23 - No acute cardiopulmonary issues  Assessment & Plan:  68 year old female active smoker with COPD/emphysema, hx nocturnal hypoxemia and extensive small cell carcinoma with no recurrence since 03/2021 who presents for COPD follow-up. Currently in exacerbation due to non-adherence due to thrush. Will step down to ICS/LABA however anticipate she will be symptomatic on this.   COPD with exacerbation --STOP Trelegy --START Anoro ONE puff ONCE a day --CONTINUE Albuterol  AS NEEDED shortness of breath or wheezing.  --Prednisone  and azithromycin  taper ordered --STAT CXR  Recent fall, right chest pain --STAT CXR  Recent thrush, residual pain --Magic mouthwash  History nocturnal hypoxemia  --Previously on 3L nightly but self-discontinued due to choking from tubing --Counseled on risks of untreated nocturnal hypoxemia --Declined overnight oximetry testing  Tobacco abuse Patient is an active smoker. Not ready to quit. Currently 2 ppd --Did not discuss this visit  Small cell carcinoma of the lung, stage IVa --Per 03/2021 Oncology note, excellent response to therapy --On maintenance therapy. On cycle 36 --CT CAP 12/2022 reviewed with no currence  Health Maintenance Immunization History  Administered Date(s) Administered   Influenza,inj,Quad PF,6+ Mos 10/02/2018, 11/30/2019, 11/15/2020   Influenza-Unspecified  01/16/2011, 11/26/2011, 12/22/2012, 11/17/2018   PFIZER Comirnaty(Gray Top)Covid-19 Tri-Sucrose Vaccine 05/17/2020   PFIZER(Purple Top)SARS-COV-2 Vaccination 10/20/2019, 11/10/2019   PNEUMOCOCCAL CONJUGATE-20 09/04/2020   Td 06/07/2005   CT Lung Screen - not qualified  Orders Placed This Encounter  Procedures   DG Chest 2 View    Standing Status:   Future    Number of Occurrences:   1    Expiration Date:   05/21/2024    Reason for Exam (SYMPTOM  OR DIAGNOSIS REQUIRED):   recent fall, left chest pain    Preferred imaging location?:   [facility]   Meds ordered this encounter  Medications   predniSONE  (DELTASONE ) 10 MG tablet    Sig: Take 4 tablets (40 mg total) by mouth daily with breakfast for 2 days, THEN 3 tablets (30 mg total) daily with breakfast for 2 days, THEN 2 tablets (20 mg total) daily with breakfast for 2 days, THEN 1 tablet (10 mg total) daily with breakfast for 2 days.    Dispense:  20 tablet    Refill:  0   azithromycin  (ZITHROMAX ) 250 MG tablet    Sig: Take two tablets on day 1, then one tablet daily on day 2-5.    Dispense:  6 tablet    Refill:  0   umeclidinium-vilanterol (ANORO ELLIPTA ) 62.5-25 MCG/ACT AEPB    Sig: Inhale 1 puff into the lungs daily.    Dispense:  60 each    Refill:  11   Return in about 6 months (around 11/21/2023).  I have spent a total time of 31-minutes on the day of the appointment including chart review, data review, collecting history, coordinating care and discussing medical diagnosis and plan with the patient/family. Past medical history, allergies, medications were reviewed. Pertinent imaging, labs and tests included in this note have been reviewed and interpreted independently by me.  Odaliz Mcqueary Genetta Kenning, MD 05/22/2023

## 2023-05-22 NOTE — Patient Instructions (Addendum)
 COPD with exacerbation --STOP Trelegy --START Anoro ONE puff ONCE a day --CONTINUE Albuterol  AS NEEDED shortness of breath or wheezing.  --Prednisone  and azithromycin  taper ordered --STAT CXR. Go to 2nd floor  Recent fall, right chest pain --STAT CXR  Recent thrush, residual pain --Magic mouthwash

## 2023-05-23 ENCOUNTER — Other Ambulatory Visit: Payer: Self-pay | Admitting: Hematology and Oncology

## 2023-05-26 NOTE — Telephone Encounter (Signed)
 Marvell Slider- has nasty bruise R breast- blue black- it''s swollen- saw Pulmonary- sent for Xrays.   Cannot really wear bra-  Has appt with me Wednesday-  Skinned knee and is black and blue  In jeans that were too big and too long- tripped and standing on jeans already and fell.   Will allow her to increase Norco to 6 pills/day from 5 pills/day- for now- we've tried Oxycodone  with her in past without good success, so won't change to Oxycodone .   Will see her Wednesday and examine her bruising, etc

## 2023-05-27 ENCOUNTER — Other Ambulatory Visit: Payer: Self-pay | Admitting: *Deleted

## 2023-05-27 DIAGNOSIS — M7989 Other specified soft tissue disorders: Secondary | ICD-10-CM

## 2023-05-28 ENCOUNTER — Encounter: Payer: Self-pay | Admitting: Physical Medicine and Rehabilitation

## 2023-05-28 ENCOUNTER — Encounter: Payer: 59 | Attending: Physical Medicine and Rehabilitation | Admitting: Physical Medicine and Rehabilitation

## 2023-05-28 VITALS — BP 122/83 | HR 88 | Ht 62.0 in | Wt 176.0 lb

## 2023-05-28 DIAGNOSIS — M21371 Foot drop, right foot: Secondary | ICD-10-CM | POA: Diagnosis not present

## 2023-05-28 DIAGNOSIS — C3490 Malignant neoplasm of unspecified part of unspecified bronchus or lung: Secondary | ICD-10-CM | POA: Diagnosis not present

## 2023-05-28 DIAGNOSIS — G894 Chronic pain syndrome: Secondary | ICD-10-CM | POA: Insufficient documentation

## 2023-05-28 DIAGNOSIS — Z5181 Encounter for therapeutic drug level monitoring: Secondary | ICD-10-CM | POA: Diagnosis not present

## 2023-05-28 DIAGNOSIS — Z79891 Long term (current) use of opiate analgesic: Secondary | ICD-10-CM | POA: Diagnosis not present

## 2023-05-28 MED ORDER — HYDROCODONE-ACETAMINOPHEN 10-325 MG PO TABS: 1.0000 | ORAL_TABLET | ORAL | 0 refills | Status: DC | PRN

## 2023-05-28 NOTE — Progress Notes (Signed)
 Subjective:    Patient ID: [name], female    DOB: August 23, 1955, 68 y.o.   MRN: [id number]  HPI Pt is a 68 yr old female with hx of newly dx'd COPD, still smoking, HTN, DM- diet controlled; anxiety and depression;  Scoliosis and deg back issues,  Generalized OA;  chronic pain- here for f/u of chronic pain issues. With new 2.2 cm R lower pulmonary nodule and hemorrhoids.  Now has dx of Small cell lung cancer extensive stage per oncology note. Here for f/u on chronic pain.        Thinks doesn't  trp injections not helpful  Has been scammed- $9k-  Got her brother's account as well.   Still fighting the cancer treatments-   Then falls and busts her R breast "all to pieces".    Also hurts under R axilla and Sternum and L breast-  and R front breast as well.  Fell 3 weeks ago tomorrow-   Also tore up R knee-   Every time has chemo- q 3 weeks-   Takes her 1 week to get over chemo- then something will occur- like fall, or Thrush- which she stopped the Trelegy because of thrush.   1 week before starting Chemo again, has abd pain/Nausea-   Missed cut off date- lights get cut off-  Begging to [employer], etc begging for help And upset about grandson form now-  Never ending Things not in a good place right now Things Marvell Slider apart when check didn't go to right place.   Feet have been swollen lately.  Cannot find pulse in foot so concerned about this and no movement in R ankle.  That's what's tripping her. Trips over toes.  Uses rolator at home-   But doesn't use much outside home.  Trips over a cane- but doesn't work well for her.  Had to drive today and had to walk a distance to get here   3 months ago, hasn't been able ot pull toes up on R ankle anymore-    Social Hx; Lives with husband and grandson-  who thinks she's ripping him off since borrowed money from him for a HVAC issue.    Pain Inventory Average Pain 8 Pain Right Now 8 My pain is constant, sharp,  stabbing, and aching  In the last 24 hours, has pain interfered with the following? General activity 9 Relation with others 8 Enjoyment of life 9 What TIME of day is your pain at its worst? morning  Sleep (in general) Poor  Pain is worse with: bending and some activites Pain improves with: rest and medication Relief from Meds: 7  Family History  Problem Relation Age of Onset   COPD Mother    Heart disease Mother    Lung disease Father        Asbestosis   Heart attack Father    Heart disease Father    Cerebral aneurysm Brother    Aneurysm Brother        Brain   Drug abuse Daughter    Epilepsy Son    Alcohol abuse Son    Drug abuse Son    Arthritis Maternal Grandmother    Heart disease Maternal Grandmother    Asthma Maternal Grandfather    Cancer Maternal Grandfather    Arthritis Paternal Grandmother    Heart disease Paternal Grandmother    Stroke Paternal Grandmother    Early death Paternal Grandfather    Heart disease Paternal Grandfather  Social History   Socioeconomic History   Marital status: Married    Spouse name: Not on file   Number of children: 2   Years of education: Not on file   Highest education level: 12th grade  Occupational History   Occupation: disabled   Occupation: disabled  Tobacco Use   Smoking status: Every Day    Current packs/day: 2.00    Average packs/day: 2.0 packs/day for 46.0 years (92.0 ttl pk-yrs)    Types: Cigarettes   Smokeless tobacco: Never   Tobacco comments:    2 packs of cigarettes smoked daily 12/13/21- declines smoking cessation  Vaping Use   Vaping status: Never Used  Substance and Sexual Activity   Alcohol use: No   Drug use: No   Sexual activity: Not Currently    Partners: Male  Other Topics Concern   Not on file  Social History Narrative   Right handed    Caffeine~ 2 cups per day    Lives at home with husband (strained relationship)   Primary caretaker for disabled brother who had aneurism   Daughter  died 22-Jun-2018   Social Drivers of Corporate investment banker Strain: Low Risk  (05/20/2023)   Overall Financial Resource Strain (CARDIA)    Difficulty of Paying Living Expenses: Not very hard  Food Insecurity: Food Insecurity Present (05/20/2023)   Hunger Vital Sign    Worried About Running Out of Food in the Last Year: Sometimes true    Ran Out of Food in the Last Year: Sometimes true  Transportation Needs: No Transportation Needs (05/20/2023)   PRAPARE - Administrator, Civil Service (Medical): No    Lack of Transportation (Non-Medical): No  Physical Activity: Inactive (05/20/2023)   Exercise Vital Sign    Days of Exercise per Week: 0 days    Minutes of Exercise per Session: 0 min  Stress: No Stress Concern Present (05/20/2023)   [employer] of Occupational Health - Occupational Stress Questionnaire    Feeling of Stress : Only a little  Recent Concern: Stress - Stress Concern Present (03/31/2023)   [employer] of Occupational Health - Occupational Stress Questionnaire    Feeling of Stress : Very much  Social Connections: Moderately Integrated (05/20/2023)   Social Connection and Isolation Panel [NHANES]    Frequency of Communication with Friends and Family: More than three times a week    Frequency of Social Gatherings with Friends and Family: More than three times a week    Attends Religious Services: 1 to 4 times per year    Active Member of [employer] or Organizations: No    Attends Banker Meetings: Never    Marital Status: Married   Past Surgical History:  Procedure Laterality Date   APPENDECTOMY     1985   BIOPSY  07/24/2017   Procedure: BIOPSY;  Surgeon: Janel Medford, MD;  Location: [facility];  Service: Endoscopy;;   CARDIAC CATHETERIZATION N/A 10/31/2015   Procedure: Left Heart Cath and Coronary Angiography;  Surgeon: Arleen Lacer, MD;  Location: [facility] INVASIVE CV LAB;  Service: Cardiovascular;  Laterality: N/A;   CARPAL TUNNEL RELEASE  Left    CARPAL TUNNEL RELEASE Right    CATARACT EXTRACTION W/PHACO Left 02/07/2023   Procedure: CATARACT EXTRACTION PHACO AND INTRAOCULAR LENS PLACEMENT (IOC);  Surgeon: Ardeth Krabbe, MD;  Location: [facility];  Service: Ophthalmology;  Laterality: Left;  CDE: 3.51   CATARACT EXTRACTION W/PHACO Right 02/21/2023   Procedure: CATARACT  EXTRACTION PHACO AND INTRAOCULAR LENS PLACEMENT (IOC);  Surgeon: Ardeth Krabbe, MD;  Location: [facility];  Service: Ophthalmology;  Laterality: Right;  CDE: 2.94   CHOLECYSTECTOMY  late 1980's   COLONOSCOPY WITH PROPOFOL  N/A 07/24/2017   Procedure: COLONOSCOPY WITH PROPOFOL ;  Surgeon: Janel Medford, MD;  Location: [facility];  Service: Endoscopy;  Laterality: N/A;   ESOPHAGOGASTRODUODENOSCOPY N/A 07/24/2017   Procedure: ESOPHAGOGASTRODUODENOSCOPY (EGD);  Surgeon: Janel Medford, MD;  Location: [facility];  Service: Endoscopy;  Laterality: N/A;   ESOPHAGOGASTRODUODENOSCOPY (EGD) WITH PROPOFOL  N/A 10/19/2020   Procedure: ESOPHAGOGASTRODUODENOSCOPY (EGD) WITH PROPOFOL ;  Surgeon: Janel Medford, MD;  Location: [facility];  Service: Endoscopy;  Laterality: N/A;   FINE NEEDLE ASPIRATION N/A 10/19/2020   Procedure: FINE NEEDLE ASPIRATION (FNA) LINEAR;  Surgeon: Janel Medford, MD;  Location: [facility];  Service: Endoscopy;  Laterality: N/A;   GALLBLADDER SURGERY  1991   HIP CLOSED REDUCTION Right 01/08/2016   Procedure: CLOSED MANIPULATION HIP;  Surgeon: Orvan Blanch, MD;  Location: [facility];  Service: Orthopedics;  Laterality: Right;   HIP CLOSED REDUCTION Right 01/19/2016   Procedure: ATTEMPTED CLOSED REDUCTION RIGHT HIP;  Surgeon: Amada Backer, MD;  Location: [facility];  Service: Orthopedics;  Laterality: Right;   HIP CLOSED REDUCTION Right 01/20/2016   Procedure: CLOSED REDUCTION RIGHT TOTAL HIP;  Surgeon: Claiborne Crew, MD;  Location: [facility];  Service: Orthopedics;  Laterality: Right;   HIP CLOSED REDUCTION Right 02/17/2016   Procedure: CLOSED REDUCTION RIGHT  TOTAL HIP;  Surgeon: Adonica Hoose, MD;  Location: [facility];  Service: Orthopedics;  Laterality: Right;   HIP CLOSED REDUCTION Right 02/28/2016   Procedure: CLOSED REDUCTION HIP;  Surgeon: Janeth Medicus, MD;  Location: [facility];  Service: Orthopedics;  Laterality: Right;   IR IMAGING GUIDED PORT INSERTION  11/01/2020   POLYPECTOMY  07/24/2017   Procedure: POLYPECTOMY;  Surgeon: Janel Medford, MD;  Location: [facility];  Service: Endoscopy;;   TONSILLECTOMY     TOTAL ABDOMINAL HYSTERECTOMY     1985, with 1 ovary removed and 2 nd ovary removed 2003   TOTAL HIP ARTHROPLASTY Right    Original surgery 2006 with revision 2010   TOTAL HIP REVISION Right 01/01/2016   Procedure: TOTAL HIP REVISION;  Surgeon: Claiborne Crew, MD;  Location: [facility];  Service: Orthopedics;  Laterality: Right;   TOTAL HIP REVISION Right 03/02/2016   Procedure: TOTAL HIP REVISION;  Surgeon: Claiborne Crew, MD;  Location: [facility];  Service: Orthopedics;  Laterality: Right;   TOTAL HIP REVISION Right 09/02/2016   Procedure: Right hip constrained liner- posterior;  Surgeon: Claiborne Crew, MD;  Location: [facility];  Service: Orthopedics;  Laterality: Right;   ULNAR NERVE TRANSPOSITION Right    UPPER ESOPHAGEAL ENDOSCOPIC ULTRASOUND (EUS) N/A 10/19/2020   Procedure: UPPER ESOPHAGEAL ENDOSCOPIC ULTRASOUND (EUS);  Surgeon: Janel Medford, MD;  Location: [facility];  Service: Endoscopy;  Laterality: N/A;  periduodenal lesion   Past Surgical History:  Procedure Laterality Date   APPENDECTOMY     1985   BIOPSY  07/24/2017   Procedure: BIOPSY;  Surgeon: Janel Medford, MD;  Location: [facility];  Service: Endoscopy;;   CARDIAC CATHETERIZATION N/A 10/31/2015   Procedure: Left Heart Cath and Coronary Angiography;  Surgeon: Arleen Lacer, MD;  Location[facility]VAS[facility] CV LAB;  Service: Cardiovascular;  Laterality: N/A;   CARPAL TUNNEL RELEASE Left    CARPAL TUNNEL RELEASE Right    CATARACT EXTRACTION W/PHACO Left 02/07/2023    Procedure: CATARACT  EXTRACTION PHACO AND INTRAOCULAR LENS PLACEMENT (IOC);  Surgeon: Ardeth Krabbe, MD;  Location: [facility];  Service: Ophthalmology;  Laterality: Left;  CDE: 3.51   CATARACT EXTRACTION W/PHACO Right 02/21/2023   Procedure: CATARACT EXTRACTION PHACO AND INTRAOCULAR LENS PLACEMENT (IOC);  Surgeon: Ardeth Krabbe, MD;  Location: [facility];  Service: Ophthalmology;  Laterality: Right;  CDE: 2.94   CHOLECYSTECTOMY  late 1980's   COLONOSCOPY WITH PROPOFOL  N/A 07/24/2017   Procedure: COLONOSCOPY WITH PROPOFOL ;  Surgeon: Janel Medford, MD;  Location: [facility];  Service: Endoscopy;  Laterality: N/A;   ESOPHAGOGASTRODUODENOSCOPY N/A 07/24/2017   Procedure: ESOPHAGOGASTRODUODENOSCOPY (EGD);  Surgeon: Janel Medford, MD;  Location: [facility];  Service: Endoscopy;  Laterality: N/A;   ESOPHAGOGASTRODUODENOSCOPY (EGD) WITH PROPOFOL  N/A 10/19/2020   Procedure: ESOPHAGOGASTRODUODENOSCOPY (EGD) WITH PROPOFOL ;  Surgeon: Janel Medford, MD;  Location: [facility];  Service: Endoscopy;  Laterality: N/A;   FINE NEEDLE ASPIRATION N/A 10/19/2020   Procedure: FINE NEEDLE ASPIRATION (FNA) LINEAR;  Surgeon: Janel Medford, MD;  Location: [facility];  Service: Endoscopy;  Laterality: N/A;   GALLBLADDER SURGERY  1991   HIP CLOSED REDUCTION Right 01/08/2016   Procedure: CLOSED MANIPULATION HIP;  Surgeon: Orvan Blanch, MD;  Location: [facility];  Service: Orthopedics;  Laterality: Right;   HIP CLOSED REDUCTION Right 01/19/2016   Procedure: ATTEMPTED CLOSED REDUCTION RIGHT HIP;  Surgeon: Amada Backer, MD;  Location: [facility];  Service: Orthopedics;  Laterality: Right;   HIP CLOSED REDUCTION Right 01/20/2016   Procedure: CLOSED REDUCTION RIGHT TOTAL HIP;  Surgeon: Claiborne Crew, MD;  Location: [facility];  Service: Orthopedics;  Laterality: Right;   HIP CLOSED REDUCTION Right 02/17/2016   Procedure: CLOSED REDUCTION RIGHT TOTAL HIP;  Surgeon: Adonica Hoose, MD;  Location: [facility];  Service: Orthopedics;   Laterality: Right;   HIP CLOSED REDUCTION Right 02/28/2016   Procedure: CLOSED REDUCTION HIP;  Surgeon: Janeth Medicus, MD;  Location: [facility];  Service: Orthopedics;  Laterality: Right;   IR IMAGING GUIDED PORT INSERTION  11/01/2020   POLYPECTOMY  07/24/2017   Procedure: POLYPECTOMY;  Surgeon: Janel Medford, MD;  Location: [facility];  Service: Endoscopy;;   TONSILLECTOMY     TOTAL ABDOMINAL HYSTERECTOMY     1985, with 1 ovary removed and 2 nd ovary removed 2003   TOTAL HIP ARTHROPLASTY Right    Original surgery 2006 with revision 2010   TOTAL HIP REVISION Right 01/01/2016   Procedure: TOTAL HIP REVISION;  Surgeon: Claiborne Crew, MD;  Location: [facility];  Service: Orthopedics;  Laterality: Right;   TOTAL HIP REVISION Right 03/02/2016   Procedure: TOTAL HIP REVISION;  Surgeon: Claiborne Crew, MD;  Location: [facility];  Service: Orthopedics;  Laterality: Right;   TOTAL HIP REVISION Right 09/02/2016   Procedure: Right hip constrained liner- posterior;  Surgeon: Claiborne Crew, MD;  Location: [facility];  Service: Orthopedics;  Laterality: Right;   ULNAR NERVE TRANSPOSITION Right    UPPER ESOPHAGEAL ENDOSCOPIC ULTRASOUND (EUS) N/A 10/19/2020   Procedure: UPPER ESOPHAGEAL ENDOSCOPIC ULTRASOUND (EUS);  Surgeon: Janel Medford, MD;  Location: [facility];  Service: Endoscopy;  Laterality: N/A;  periduodenal lesion   Past Medical History:  Diagnosis Date   Allergic rhinitis    Anemia    Anxiety    Chicken pox    Chronic back pain    COPD (chronic obstructive pulmonary disease) (HCC)    Depression    DM (diabetes mellitus) (HCC)    Essential hypertension    GERD (gastroesophageal reflux  disease)    Headache    migraines   History of gastritis    EGD 2015   History of home oxygen  therapy    2 liters at hs last 6 months   Hyperlipidemia    Hypothyroidism    Migraines    Osteoarthritis    oa   Scoliosis    Small cell lung cancer (HCC)    Stage 4   Ht 5\' 2"  (1.575 m)   Wt 176 lb (79.8  kg)   BMI 32.19 kg/m   Opioid Risk Score:   Fall Risk Score:  `1  Depression screen PHQ 2/9     05/20/2023    1:11 PM 05/08/2023    1:25 PM 01/24/2023    1:54 PM 01/06/2023   12:56 PM 12/13/2022    1:42 PM 09/04/2022   11:47 AM 06/05/2022    9:42 AM  Depression screen PHQ 2/9  Decreased Interest 1 0 3 3 1 1 3   Down, Depressed, Hopeless 1 0 3 3 1 1 3   PHQ - 2 Score 2 0 6 6 2 2 6   Altered sleeping 0   1     Tired, decreased energy 1   3     Change in appetite 0   3     Feeling bad or failure about yourself  1   3     Trouble concentrating 0   1     Moving slowly or fidgety/restless 0   0     Suicidal thoughts 0   0     PHQ-9 Score 4   17     Difficult doing work/chores Not difficult at all   Somewhat difficult       Review of Systems  Musculoskeletal:  Positive for back pain.       Right shoulder pain, right knee pain, right foot pain, right upper arm pain  All other systems reviewed and are negative.      Objective:   Physical Exam  Awake, alert, depressed; tearful, NAD Large bruise medial and inner R breat- no bruise on Sternum and  no bruise on R axilla.   Trace LE edema to ankles Not severe today  Neuro:  Decreased to light touch B/L at mid calf 1+ DTRs in LE's-  No clonus; no increased tone   MSK: RUE- 4+/5 throughout except FA which is 2/5 with hx of CTS surgery in 2012 and numbness/tingling in ulnar distribution on R hand) LUE 4+/5 - both limited by pain HF 4/5 on R 4+/5 on L KE 4/5 on R and 4+/5 on L KF 4/5 on R and 4+/5 on L DF 2/5 on R and 3+/5 on L PF 3-/5 and 4/5 on L         Assessment & Plan:   Pt is a 68 yr old female with hx of newly dx'd COPD, still smoking, HTN, DM- diet controlled; anxiety and depression;  Scoliosis and deg back issues,  Generalized OA;  chronic pain- here for f/u of chronic pain issues. With new 2.2 cm R lower pulmonary nodule and hemorrhoids.  Now has dx of Small cell lung cancer extensive stage per oncology  note. Here for f/u on chronic pain.  And new progressive LE weakness more in foot drop        MRI with contrast of lumbar spine- due to Small cell lung  CA in setting of progressive R>L foot drop- will need to get ASAP- have also sent secure  chat to Dr [name]-   2. If your symptoms get any worse- with that weakness, you go to ER- made very clear if symptoms get ANY worse- has to go to emergency room.    3.  Increased dose of Norco 10/325 has been helpful and pain doing better- asked for it to be refilled early- sent in 6 pills/day 180 pills this time   4. Oral drug screen done-  today per clinic policy   5.  Have to wait for things to  heal.    6. Use rolator as much as possible- - will catch you-  cannot put in Foot up AFO since doesn't wear lace up shoes- and she also doesn't wear appropriate shoes for traditional carbon fiber AFO- she normally wears house slippers or  cowboy boots or slip on's   7. F/U in 2months with me/ and q 2months with me  Told her if they cannot do by end of week, then needs to just go to ED    I spent a total of 44   minutes on total care today- >50% coordination of care- due to d/w pt about progressive B/L foot drop- R>L- and what this could mean- oral drug screen- and extensive exam as well as educated  on what these Sx's could mean- could mean something compressing spine.

## 2023-05-28 NOTE — Patient Instructions (Signed)
 Pt is a 68 yr old female with hx of newly dx'd COPD, still smoking, HTN, DM- diet controlled; anxiety and depression;  Scoliosis and deg back issues,  Generalized OA;  chronic pain- here for f/u of chronic pain issues. With new 2.2 cm R lower pulmonary nodule and hemorrhoids.  Now has dx of Small cell lung cancer extensive stage per oncology note. Here for f/u on chronic pain.  And new progressive LE weakness more in foot drop        MRI with contrast of lumbar spine- due to Small cell lung  CA in setting of progressive R>L foot drop- will need to get ASAP- have also sent secure chat to Dr [name]-   2. If your symptoms get any worse- with that weakness, you go to ER- made very clear if symptoms get ANY worse- has to go to emergency room.    3.  Increased dose of Norco 10/325 has been helpful and pain doing better- asked for it to be refilled early- sent in 6 pills/day 180 pills this time   4. Oral drug screen done-  today per clinic policy   5.  Have to wait for things to  heal.    6. Use rolator as much as possible- - will catch you-  cannot put in Foot up AFO since doesn't wear lace up shoes- and she also doesn't wear appropriate shoes for traditional carbon fiber AFO- she normally wears house slippers or  cowboy boots or slip on's   7. F/U in 2months with me/ and q 2months with me

## 2023-05-29 ENCOUNTER — Ambulatory Visit (HOSPITAL_COMMUNITY)
Admission: RE | Admit: 2023-05-29 | Discharge: 2023-05-29 | Disposition: A | Discharge status: Home / Self Care | Source: Ambulatory Visit | Attending: Physical Medicine and Rehabilitation | Admitting: Physical Medicine and Rehabilitation

## 2023-05-29 ENCOUNTER — Ambulatory Visit (HOSPITAL_COMMUNITY)
Admission: RE | Admit: 2023-05-29 | Discharge: 2023-05-29 | Disposition: A | Discharge status: Home / Self Care | Source: Ambulatory Visit | Attending: Vascular Surgery | Admitting: Vascular Surgery

## 2023-05-29 DIAGNOSIS — M4186 Other forms of scoliosis, lumbar region: Secondary | ICD-10-CM | POA: Diagnosis not present

## 2023-05-29 DIAGNOSIS — M21371 Foot drop, right foot: Secondary | ICD-10-CM

## 2023-05-29 DIAGNOSIS — M7989 Other specified soft tissue disorders: Secondary | ICD-10-CM | POA: Insufficient documentation

## 2023-05-29 DIAGNOSIS — C3490 Malignant neoplasm of unspecified part of unspecified bronchus or lung: Secondary | ICD-10-CM | POA: Insufficient documentation

## 2023-05-29 DIAGNOSIS — M47816 Spondylosis without myelopathy or radiculopathy, lumbar region: Secondary | ICD-10-CM | POA: Diagnosis not present

## 2023-05-29 DIAGNOSIS — M48061 Spinal stenosis, lumbar region without neurogenic claudication: Secondary | ICD-10-CM | POA: Diagnosis not present

## 2023-05-29 MED ORDER — GADOBUTROL 1 MMOL/ML IV SOLN
7.0000 mL | Freq: Once | INTRAVENOUS | Status: AC | PRN
  Administered 2023-05-29: 7 mL via INTRAVENOUS

## 2023-06-01 LAB — DRUG TOX MONITOR 1 W/CONF, ORAL FLD
Alprazolam: 8.84 ng/mL — ABNORMAL HIGH (ref <0.50)
Aminoclonazepam: NEGATIVE ng/mL (ref <0.50)
Amphetamines: NEGATIVE ng/mL (ref <10)
Barbiturates: NEGATIVE ng/mL (ref <10)
Benzodiazepines: POSITIVE ng/mL — AB (ref <0.50)
Buprenorphine: NEGATIVE ng/mL (ref <0.10)
Chlordiazepoxide: NEGATIVE ng/mL (ref <0.50)
Clonazepam: NEGATIVE ng/mL (ref <0.50)
Cocaine: NEGATIVE ng/mL (ref <5.0)
Codeine: NEGATIVE ng/mL (ref <2.5)
Cotinine: >250 ng/mL — ABNORMAL HIGH (ref <5.0)
Diazepam: NEGATIVE ng/mL (ref <0.50)
Dihydrocodeine: 29.7 ng/mL — ABNORMAL HIGH (ref <2.5)
Fentanyl: NEGATIVE ng/mL (ref <0.10)
Flunitrazepam: NEGATIVE ng/mL (ref <0.50)
Flurazepam: NEGATIVE ng/mL (ref <0.50)
Heroin Metabolite: NEGATIVE ng/mL (ref <1.0)
Hydrocodone: >250 ng/mL — ABNORMAL HIGH (ref <2.5)
Hydromorphone: NEGATIVE ng/mL (ref <2.5)
Lorazepam: NEGATIVE ng/mL (ref <0.50)
MARIJUANA: NEGATIVE ng/mL (ref <2.5)
MDMA: NEGATIVE ng/mL (ref <10)
Meprobamate: NEGATIVE ng/mL (ref <2.5)
Methadone: NEGATIVE ng/mL (ref <5.0)
Midazolam: NEGATIVE ng/mL (ref <0.50)
Morphine: NEGATIVE ng/mL (ref <2.5)
Nicotine Metabolite: POSITIVE ng/mL — AB (ref <5.0)
Nordiazepam: NEGATIVE ng/mL (ref <0.50)
Norhydrocodone: 29.9 ng/mL — ABNORMAL HIGH (ref <2.5)
Noroxycodone: NEGATIVE ng/mL (ref <2.5)
Opiates: POSITIVE ng/mL — AB (ref <2.5)
Oxazepam: NEGATIVE ng/mL (ref <0.50)
Oxycodone: NEGATIVE ng/mL (ref <2.5)
Oxymorphone: NEGATIVE ng/mL (ref <2.5)
Phencyclidine: NEGATIVE ng/mL (ref <10)
Tapentadol: NEGATIVE ng/mL (ref <5.0)
Temazepam: NEGATIVE ng/mL (ref <0.50)
Tramadol: NEGATIVE ng/mL (ref <5.0)
Triazolam: NEGATIVE ng/mL (ref <0.50)
Zolpidem: NEGATIVE ng/mL (ref <5.0)

## 2023-06-01 LAB — DRUG TOX ALC METAB W/CON, ORAL FLD: Alcohol Metabolite: NEGATIVE ng/mL (ref <25)

## 2023-06-02 ENCOUNTER — Other Ambulatory Visit: Payer: Self-pay | Admitting: Physician Assistant

## 2023-06-03 ENCOUNTER — Ambulatory Visit: Attending: Vascular Surgery | Admitting: Vascular Surgery

## 2023-06-03 ENCOUNTER — Encounter: Payer: Self-pay | Admitting: Vascular Surgery

## 2023-06-03 VITALS — BP 119/80 | HR 96 | Temp 97.8°F | Resp 20 | Ht 62.0 in | Wt 176.8 lb

## 2023-06-03 DIAGNOSIS — I872 Venous insufficiency (chronic) (peripheral): Secondary | ICD-10-CM | POA: Diagnosis not present

## 2023-06-03 DIAGNOSIS — M7989 Other specified soft tissue disorders: Secondary | ICD-10-CM

## 2023-06-03 NOTE — Progress Notes (Signed)
 [facility] [place]  ASSESSMENT / PLAN: [name] is a 69 y.o. female with chronic venous insufficiency of right lower extremity causing swelling (C2 disease).  Venous duplex is significant for saphenous vein reflux without saphenofemoral junction reflux Recommend compression and elevation for symptomatic relief. She is not a candidate for endovenous ablation given her advanced cancer and anatomic findings. Follow-up with me as needed  CHIEF COMPLAINT: Right leg swelling  HISTORY OF PRESENT ILLNESS: [name] is a 68 y.o. female referred to clinic for evaluation of right lower extremity swelling and varicosities.  The patient is bothered by the swelling, which occurs daily.  It is not a typical location for chronic venous insufficiency about the hindfoot, ankle, and calf.  She does have prominent varicosities in the medial calf.  The patient has been on palliative chemotherapy for several years for stage IV lung cancer.  Thankfully her disease has not progressed.   Past Medical History:  Diagnosis Date   Allergic rhinitis    Anemia    Anxiety    Chicken pox    Chronic back pain    COPD (chronic obstructive pulmonary disease) (HCC)    Depression    DM (diabetes mellitus) (HCC)    Essential hypertension    GERD (gastroesophageal reflux disease)    Headache    migraines   History of gastritis    EGD 2015   History of home oxygen  therapy    2 liters at hs last 6 months   Hyperlipidemia    Hypothyroidism    Migraines    Osteoarthritis    oa   Scoliosis    Small cell lung cancer (HCC)    Stage 4    Past Surgical History:  Procedure Laterality Date   APPENDECTOMY     1985   BIOPSY  07/24/2017   Procedure: BIOPSY;  Surgeon: Janel Medford, MD;  Location: [facility];  Service: Endoscopy;;   CARDIAC CATHETERIZATION N/A 10/31/2015   Procedure: Left Heart Cath and Coronary Angiography;  Surgeon: Arleen Lacer, MD;  Location: [facility] INVASIVE CV  LAB;  Service: Cardiovascular;  Laterality: N/A;   CARPAL TUNNEL RELEASE Left    CARPAL TUNNEL RELEASE Right    CATARACT EXTRACTION W/PHACO Left 02/07/2023   Procedure: CATARACT EXTRACTION PHACO AND INTRAOCULAR LENS PLACEMENT (IOC);  Surgeon: Ardeth Krabbe, MD;  Location: [facility];  Service: Ophthalmology;  Laterality: Left;  CDE: 3.51   CATARACT EXTRACTION W/PHACO Right 02/21/2023   Procedure: CATARACT EXTRACTION PHACO AND INTRAOCULAR LENS PLACEMENT (IOC);  Surgeon: Ardeth Krabbe, MD;  Location: [facility];  Service: Ophthalmology;  Laterality: Right;  CDE: 2.94   CHOLECYSTECTOMY  late 1980's   COLONOSCOPY WITH PROPOFOL  N/A 07/24/2017   Procedure: COLONOSCOPY WITH PROPOFOL ;  Surgeon: Janel Medford, MD;  Location: [facility];  Service: Endoscopy;  Laterality: N/A;   ESOPHAGOGASTRODUODENOSCOPY N/A 07/24/2017   Procedure: ESOPHAGOGASTRODUODENOSCOPY (EGD);  Surgeon: Janel Medford, MD;  Location: [facility];  Service: Endoscopy;  Laterality: N/A;   ESOPHAGOGASTRODUODENOSCOPY (EGD) WITH PROPOFOL  N/A 10/19/2020   Procedure: ESOPHAGOGASTRODUODENOSCOPY (EGD) WITH PROPOFOL ;  Surgeon: Janel Medford, MD;  Location: [facility];  Service: Endoscopy;  Laterality: N/A;   FINE NEEDLE ASPIRATION N/A 10/19/2020   Procedure: FINE NEEDLE ASPIRATION (FNA) LINEAR;  Surgeon: Janel Medford, MD;  Location: [facility];  Service: Endoscopy;  Laterality: N/A;   GALLBLADDER SURGERY  1991   HIP CLOSED REDUCTION Right 01/08/2016   Procedure: CLOSED MANIPULATION HIP;  Surgeon: Susana Enter  Leighton Punches, MD;  Location: [facility];  Service: Orthopedics;  Laterality: Right;   HIP CLOSED REDUCTION Right 01/19/2016   Procedure: ATTEMPTED CLOSED REDUCTION RIGHT HIP;  Surgeon: Amada Backer, MD;  Location: [facility];  Service: Orthopedics;  Laterality: Right;   HIP CLOSED REDUCTION Right 01/20/2016   Procedure: CLOSED REDUCTION RIGHT TOTAL HIP;  Surgeon: Claiborne Crew, MD;  Location: [facility];  Service: Orthopedics;  Laterality: Right;    HIP CLOSED REDUCTION Right 02/17/2016   Procedure: CLOSED REDUCTION RIGHT TOTAL HIP;  Surgeon: Adonica Hoose, MD;  Location: [facility];  Service: Orthopedics;  Laterality: Right;   HIP CLOSED REDUCTION Right 02/28/2016   Procedure: CLOSED REDUCTION HIP;  Surgeon: Janeth Medicus, MD;  Location: [facility];  Service: Orthopedics;  Laterality: Right;   IR IMAGING GUIDED PORT INSERTION  11/01/2020   POLYPECTOMY  07/24/2017   Procedure: POLYPECTOMY;  Surgeon: Janel Medford, MD;  Location: [facility];  Service: Endoscopy;;   TONSILLECTOMY     TOTAL ABDOMINAL HYSTERECTOMY     1985, with 1 ovary removed and 2 nd ovary removed 2003   TOTAL HIP ARTHROPLASTY Right    Original surgery 2006 with revision 2010   TOTAL HIP REVISION Right 01/01/2016   Procedure: TOTAL HIP REVISION;  Surgeon: Claiborne Crew, MD;  Location: [facility];  Service: Orthopedics;  Laterality: Right;   TOTAL HIP REVISION Right 03/02/2016   Procedure: TOTAL HIP REVISION;  Surgeon: Claiborne Crew, MD;  Location: [facility];  Service: Orthopedics;  Laterality: Right;   TOTAL HIP REVISION Right 09/02/2016   Procedure: Right hip constrained liner- posterior;  Surgeon: Claiborne Crew, MD;  Location: [facility];  Service: Orthopedics;  Laterality: Right;   ULNAR NERVE TRANSPOSITION Right    UPPER ESOPHAGEAL ENDOSCOPIC ULTRASOUND (EUS) N/A 10/19/2020   Procedure: UPPER ESOPHAGEAL ENDOSCOPIC ULTRASOUND (EUS);  Surgeon: Janel Medford, MD;  Location: [facility];  Service: Endoscopy;  Laterality: N/A;  periduodenal lesion    Family History  Problem Relation Age of Onset   COPD Mother    Heart disease Mother    Lung disease Father        Asbestosis   Heart attack Father    Heart disease Father    Cerebral aneurysm Brother    Aneurysm Brother        Brain   Drug abuse Daughter    Epilepsy Son    Alcohol abuse Son    Drug abuse Son    Arthritis Maternal Grandmother    Heart disease Maternal Grandmother    Asthma Maternal Grandfather    Cancer  Maternal Grandfather    Arthritis Paternal Grandmother    Heart disease Paternal Grandmother    Stroke Paternal Grandmother    Early death Paternal Grandfather    Heart disease Paternal Grandfather     Social History   Socioeconomic History   Marital status: Married    Spouse name: Not on file   Number of children: 2   Years of education: Not on file   Highest education level: 12th grade  Occupational History   Occupation: disabled   Occupation: disabled  Tobacco Use   Smoking status: Every Day    Current packs/day: 2.00    Average packs/day: 2.0 packs/day for 46.0 years (92.0 ttl pk-yrs)    Types: Cigarettes   Smokeless tobacco: Never   Tobacco comments:    2 packs of cigarettes smoked daily 12/13/21- declines smoking cessation  Vaping Use   Vaping status: Never Used  Substance and  Sexual Activity   Alcohol use: No   Drug use: No   Sexual activity: Not Currently    Partners: Male  Other Topics Concern   Not on file  Social History Narrative   Right handed    Caffeine~ 2 cups per day    Lives at home with husband (strained relationship)   Primary caretaker for disabled brother who had aneurism   Daughter died 2018/07/09   Social Drivers of Health   Financial Resource Strain: Low Risk  (05/20/2023)   Overall Financial Resource Strain (CARDIA)    Difficulty of Paying Living Expenses: Not very hard  Food Insecurity: Food Insecurity Present (05/20/2023)   Hunger Vital Sign    Worried About Running Out of Food in the Last Year: Sometimes true    Ran Out of Food in the Last Year: Sometimes true  Transportation Needs: No Transportation Needs (05/20/2023)   PRAPARE - Administrator, Civil Service (Medical): No    Lack of Transportation (Non-Medical): No  Physical Activity: Inactive (05/20/2023)   Exercise Vital Sign    Days of Exercise per Week: 0 days    Minutes of Exercise per Session: 0 min  Stress: No Stress Concern Present (05/20/2023)   [employer] of Occupational Health - Occupational Stress Questionnaire    Feeling of Stress : Only a little  Recent Concern: Stress - Stress Concern Present (03/31/2023)   [employer] of Occupational Health - Occupational Stress Questionnaire    Feeling of Stress : Very much  Social Connections: Moderately Integrated (05/20/2023)   Social Connection and Isolation Panel [NHANES]    Frequency of Communication with Friends and Family: More than three times a week    Frequency of Social Gatherings with Friends and Family: More than three times a week    Attends Religious Services: 1 to 4 times per year    Active Member of [employer] or Organizations: No    Attends Banker Meetings: Never    Marital Status: Married  Catering manager Violence: Not At Risk (05/20/2023)   Humiliation, Afraid, Rape, and Kick questionnaire    Fear of Current or Ex-Partner: No    Emotionally Abused: No    Physically Abused: No    Sexually Abused: No    Allergies  Allergen Reactions   Nisoldipine Other (See Comments)   Metformin  And Related Diarrhea   Nsaids Diarrhea   Pregabalin Other (See Comments)    pregabalin   Wellbutrin  [Bupropion ] Other (See Comments)    Makes her too sleepy    Aleve [Naproxen Sodium] Other (See Comments)    Headache    Penicillins Nausea Only and Other (See Comments)    GI upset    Sulfonamide Derivatives Hives    Current Outpatient Medications  Medication Sig Dispense Refill   albuterol  (PROAIR  HFA) 108 (90 Base) MCG/ACT inhaler 2 puffs every 4 hours as needed only  if your can't catch your breath 18 g 5   ALPRAZolam  (XANAX ) 1 MG tablet TAKE 1 TABLET BY MOUTH THREE TIMES DAILY AS NEEDED FOR ANXIETY 90 tablet 2   ARIPiprazole  (ABILIFY ) 5 MG tablet Take 1 tablet (5 mg total) by mouth daily. 30 tablet 5   atorvastatin  (LIPITOR) 20 MG tablet TAKE 1 TABLET BY MOUTH AT BEDTIME 90 tablet 1   azithromycin  (ZITHROMAX ) 250 MG tablet Take two tablets on day 1, then one tablet  daily on day 2-5. 6 tablet 0   benzonatate  (TESSALON ) 100 MG capsule  TAKE ONE CAPSULE BY MOUTH THREE TIMES DAILY 90 capsule 1   calcipotriene  (DOVONOX) 0.005 % cream Apply to affected area bid when on break from steroid cream 120 g 3   cyclobenzaprine  (FLEXERIL ) 10 MG tablet Take 10 mg by mouth 3 (three) times daily as needed.     dicyclomine  (BENTYL ) 10 MG capsule Take 1 capsule (10 mg total) by mouth every 8 (eight) hours as needed for spasms. Please keep your May appointment for further refills. Thank you. 60 capsule 1   diphenoxylate -atropine  (LOMOTIL ) 2.5-0.025 MG tablet TAKE 1 TABLET BY MOUTH FOUR TIMES DAILY AS NEEDED FOR diarrhea OR loose stools 60 tablet 0   DULoxetine  (CYMBALTA ) 60 MG capsule TAKE TWO CAPSULES BY MOUTH EVERY DAY (Patient taking differently: Take 60 mg by mouth daily. Patient states that she adjusted her dose herself.) 180 capsule 1   FEROSUL 325 (65 Fe) MG tablet TAKE 1 TABLET BY MOUTH DAILY WITH BREAKFAST 30 tablet 3   fluconazole  (DIFLUCAN ) 150 MG tablet Take 150 mg by mouth once.     fluticasone  (FLONASE ) 50 MCG/ACT nasal spray INSTILL 2 SPRAYS IN EACH NOSTRIL EVERY DAY 16 g 6   furosemide  (LASIX ) 20 MG tablet TAKE 1 TABLET BY MOUTH TWICE DAILY, IF SWELLING IMPROVES CAN HOLD MEDICATION 90 tablet 0   HYDROcodone -acetaminophen  (NORCO) 10-325 MG tablet Take 1 tablet by mouth every 4 (four) hours as needed. Needs to be filled before 5/9- because I increased dose to 6 pills /day from 5 pills/day- - on max dose from now on 180 tablet 0   hydrOXYzine  (ATARAX ) 10 MG tablet TAKE 1 TABLET BY MOUTH THREE TIMES DAILY AS NEEDED FOR ITCHING 30 tablet 2   levothyroxine  (SYNTHROID ) 137 MCG tablet Take 1 tablet (137 mcg total) by mouth daily before breakfast. 90 tablet 3   loratadine  (CLARITIN ) 10 MG tablet Take 10 mg by mouth daily.     magic mouthwash (nystatin , diphenhydrAMINE , alum & mag hydroxide) suspension mixture Swish and spit 5 mLs 3 (three) times daily as needed. 180 mL 0    meclizine  (ANTIVERT ) 12.5 MG tablet TAKE 1 TABLET BY MOUTH THREE TIMES DAILY AS NEEDED FOR DIZZINESS 30 tablet 1   meloxicam  (MOBIC ) 15 MG tablet TAKE 1 TABLET BY MOUTH EVERY DAY 30 tablet 2   OLANZapine  (ZYPREXA ) 10 MG tablet Take 1 tablet (10 mg total) by mouth at bedtime. 30 tablet 2   omeprazole  (PRILOSEC) 40 MG capsule TAKE ONE CAPSULE BY MOUTH TWICE DAILY 180 capsule 1   ondansetron  (ZOFRAN ) 8 MG tablet TAKE 1 TABLET BY MOUTH TWICE DAILY 60 tablet 1   permethrin  (ELIMITE ) 5 % cream      potassium chloride  SA (KLOR-CON  M) 20 MEQ tablet TAKE 1 TABLET BY MOUTH TWICE DAILY (Patient taking differently: Take 20 mEq by mouth daily. Patient states that she adjusted the dose herself.) 60 tablet 2   prochlorperazine  (COMPAZINE ) 10 MG tablet Take 1 tablet (10 mg total) by mouth every 6 (six) hours as needed for nausea or vomiting. 30 tablet 0   Rimegepant Sulfate (NURTEC) 75 MG TBDP Take 1 tablet (75 mg total) by mouth daily as needed (for migraine-). Take within 15 minutes of initial symptoms 8 tablet 5   topiramate  (TOPAMAX ) 100 MG tablet Take 1 tablet (100 mg total) by mouth at bedtime. Changed to 100 mg tabs- for ease 90 tablet 1   triamcinolone  ointment (KENALOG ) 0.5 % Apply 1 Application topically 3 (three) times daily. Apply thin-layer on affected areas, no  more than 2 weeks consistently. 60 g 2   triamterene -hydrochlorothiazide  (MAXZIDE -25) 37.5-25 MG tablet Take 1 tablet by mouth daily.     umeclidinium-vilanterol (ANORO ELLIPTA ) 62.5-25 MCG/ACT AEPB Inhale 1 puff into the lungs daily. 60 each 11   No current facility-administered medications for this visit.    PHYSICAL EXAM Vitals:   06/03/23 1402  BP: 119/80  Pulse: 96  Resp: 20  Temp: 97.8 F (36.6 C)  SpO2: 96%  Weight: 176 lb 12.8 oz (80.2 kg)  Height: 5\' 2"  (1.575 m)   Elderly woman in no distress Regular rate and rhythm Unlabored breathing Palpable dorsalis pedis pulses bilaterally Prominent varicosity in the right medial  leg from the calf to the mid thigh.  Mild swelling about the hindfoot, ankle, and calf of the right leg   PERTINENT LABORATORY AND RADIOLOGIC DATA  Most recent CBC    Latest Ref Rng & Units 05/21/2023    9:47 AM 04/30/2023   10:22 AM 04/10/2023   11:02 AM  CBC  WBC 4.0 - 10.5 K/uL 7.1  7.2  9.0   Hemoglobin 12.0 - 15.0 g/dL 65.7  84.6  96.2   Hematocrit 36.0 - 46.0 % 40.1  41.5  41.1   Platelets 150 - 400 K/uL 169  174  188      Most recent CMP    Latest Ref Rng & Units 05/21/2023    9:47 AM 04/30/2023   10:22 AM 04/10/2023   11:02 AM  CMP  Glucose 70 - 99 mg/dL 952  841  324   BUN 8 - 23 mg/dL 12  9  13    Creatinine 0.44 - 1.00 mg/dL 4.01  0.27  2.53   Sodium 135 - 145 mmol/L 136  132  128   Potassium 3.5 - 5.1 mmol/L 3.7  3.4  3.6   Chloride 98 - 111 mmol/L 103  99  95   CO2 22 - 32 mmol/L 28  27  28    Calcium  8.9 - 10.3 mg/dL 8.4  8.6  8.1   Total Protein 6.5 - 8.1 g/dL 6.0  6.1  6.5   Total Bilirubin 0.0 - 1.2 mg/dL 0.4  0.5  0.4   Alkaline Phos 38 - 126 U/L 76  67  66   AST 15 - 41 U/L 25  15  14    ALT 0 - 44 U/L 23  9  11      Renal function Estimated Creatinine Clearance: 53 mL/min (A) (by C-G formula based on SCr of 1.01 mg/dL (H)).  Hemoglobin A1C (%)  Date Value  05/08/2023 5.5   Hgb A1c MFr Bld (%)  Date Value  01/05/2021 6.5    LDL Cholesterol  Date Value Ref Range Status  11/30/2019 27 0 - 99 mg/dL Final   Direct LDL  Date Value Ref Range Status  08/31/2018 72.0 mg/dL Final    Comment:    Optimal:  <100 mg/dLNear or Above Optimal:  100-129 mg/dLBorderline High:  130-159 mg/dLHigh:  160-189 mg/dLVery High:  >190 mg/dL     Right lower extremity venous reflux study:  - No evidence of deep vein thrombosis seen in the right lower extremity,  from the common femoral through the popliteal veins.  - No evidence of superficial venous thrombosis in the right lower  extremity.  - Venous reflux is noted in the right greater saphenous vein in the thigh.  -  Venous reflux is noted in the right greater saphenous vein in the calf.  -  Venous reflux is noted in the right perforator vein.    *See table(s) above for measurements and observations.   Heber Little. Edgardo Goodwill, MD FACS [facility] Phone Number: [phone] 06/03/2023 5:00 PM   Total time spent on preparing this encounter including chart review, data review, collecting history, examining the patient, and coordinating care: 45 minutes  Portions of this report may have been transcribed using voice recognition software.  Every effort has been made to ensure accuracy; however, inadvertent computerized transcription errors may still be present.

## 2023-06-05 ENCOUNTER — Other Ambulatory Visit: Payer: Self-pay | Admitting: Physical Medicine and Rehabilitation

## 2023-06-11 ENCOUNTER — Inpatient Hospital Stay (HOSPITAL_BASED_OUTPATIENT_CLINIC_OR_DEPARTMENT_OTHER): Admitting: Physician Assistant

## 2023-06-11 ENCOUNTER — Inpatient Hospital Stay

## 2023-06-11 ENCOUNTER — Inpatient Hospital Stay: Attending: Hematology and Oncology | Admitting: Dietician

## 2023-06-11 ENCOUNTER — Inpatient Hospital Stay: Attending: Hematology and Oncology

## 2023-06-11 VITALS — BP 143/89 | HR 92 | Temp 98.0°F | Resp 15 | Wt 177.9 lb

## 2023-06-11 DIAGNOSIS — Z8261 Family history of arthritis: Secondary | ICD-10-CM | POA: Insufficient documentation

## 2023-06-11 DIAGNOSIS — F32A Depression, unspecified: Secondary | ICD-10-CM | POA: Insufficient documentation

## 2023-06-11 DIAGNOSIS — Z79899 Other long term (current) drug therapy: Secondary | ICD-10-CM | POA: Diagnosis not present

## 2023-06-11 DIAGNOSIS — F17210 Nicotine dependence, cigarettes, uncomplicated: Secondary | ICD-10-CM | POA: Insufficient documentation

## 2023-06-11 DIAGNOSIS — R197 Diarrhea, unspecified: Secondary | ICD-10-CM | POA: Insufficient documentation

## 2023-06-11 DIAGNOSIS — M4316 Spondylolisthesis, lumbar region: Secondary | ICD-10-CM | POA: Insufficient documentation

## 2023-06-11 DIAGNOSIS — M51379 Other intervertebral disc degeneration, lumbosacral region without mention of lumbar back pain or lower extremity pain: Secondary | ICD-10-CM | POA: Diagnosis not present

## 2023-06-11 DIAGNOSIS — E876 Hypokalemia: Secondary | ICD-10-CM | POA: Diagnosis not present

## 2023-06-11 DIAGNOSIS — M48061 Spinal stenosis, lumbar region without neurogenic claudication: Secondary | ICD-10-CM | POA: Diagnosis not present

## 2023-06-11 DIAGNOSIS — F419 Anxiety disorder, unspecified: Secondary | ICD-10-CM | POA: Insufficient documentation

## 2023-06-11 DIAGNOSIS — Z5112 Encounter for antineoplastic immunotherapy: Secondary | ICD-10-CM | POA: Insufficient documentation

## 2023-06-11 DIAGNOSIS — I700 Atherosclerosis of aorta: Secondary | ICD-10-CM | POA: Diagnosis not present

## 2023-06-11 DIAGNOSIS — G8929 Other chronic pain: Secondary | ICD-10-CM | POA: Insufficient documentation

## 2023-06-11 DIAGNOSIS — K5900 Constipation, unspecified: Secondary | ICD-10-CM | POA: Insufficient documentation

## 2023-06-11 DIAGNOSIS — Z9049 Acquired absence of other specified parts of digestive tract: Secondary | ICD-10-CM | POA: Insufficient documentation

## 2023-06-11 DIAGNOSIS — Z882 Allergy status to sulfonamides status: Secondary | ICD-10-CM | POA: Insufficient documentation

## 2023-06-11 DIAGNOSIS — Z811 Family history of alcohol abuse and dependence: Secondary | ICD-10-CM | POA: Insufficient documentation

## 2023-06-11 DIAGNOSIS — Z886 Allergy status to analgesic agent status: Secondary | ICD-10-CM | POA: Insufficient documentation

## 2023-06-11 DIAGNOSIS — Z809 Family history of malignant neoplasm, unspecified: Secondary | ICD-10-CM | POA: Insufficient documentation

## 2023-06-11 DIAGNOSIS — Z8719 Personal history of other diseases of the digestive system: Secondary | ICD-10-CM | POA: Insufficient documentation

## 2023-06-11 DIAGNOSIS — M4804 Spinal stenosis, thoracic region: Secondary | ICD-10-CM | POA: Insufficient documentation

## 2023-06-11 DIAGNOSIS — Z79890 Hormone replacement therapy (postmenopausal): Secondary | ICD-10-CM | POA: Diagnosis not present

## 2023-06-11 DIAGNOSIS — Z820 Family history of epilepsy and other diseases of the nervous system: Secondary | ICD-10-CM | POA: Insufficient documentation

## 2023-06-11 DIAGNOSIS — Z880 Allergy status to penicillin: Secondary | ICD-10-CM | POA: Insufficient documentation

## 2023-06-11 DIAGNOSIS — I10 Essential (primary) hypertension: Secondary | ICD-10-CM | POA: Diagnosis not present

## 2023-06-11 DIAGNOSIS — Z888 Allergy status to other drugs, medicaments and biological substances status: Secondary | ICD-10-CM | POA: Insufficient documentation

## 2023-06-11 DIAGNOSIS — Z8249 Family history of ischemic heart disease and other diseases of the circulatory system: Secondary | ICD-10-CM | POA: Insufficient documentation

## 2023-06-11 DIAGNOSIS — C3431 Malignant neoplasm of lower lobe, right bronchus or lung: Secondary | ICD-10-CM

## 2023-06-11 DIAGNOSIS — Z95828 Presence of other vascular implants and grafts: Secondary | ICD-10-CM

## 2023-06-11 DIAGNOSIS — R600 Localized edema: Secondary | ICD-10-CM | POA: Diagnosis not present

## 2023-06-11 DIAGNOSIS — Z825 Family history of asthma and other chronic lower respiratory diseases: Secondary | ICD-10-CM | POA: Insufficient documentation

## 2023-06-11 DIAGNOSIS — Z90710 Acquired absence of both cervix and uterus: Secondary | ICD-10-CM | POA: Insufficient documentation

## 2023-06-11 DIAGNOSIS — M51369 Other intervertebral disc degeneration, lumbar region without mention of lumbar back pain or lower extremity pain: Secondary | ICD-10-CM | POA: Insufficient documentation

## 2023-06-11 DIAGNOSIS — I872 Venous insufficiency (chronic) (peripheral): Secondary | ICD-10-CM | POA: Insufficient documentation

## 2023-06-11 DIAGNOSIS — E785 Hyperlipidemia, unspecified: Secondary | ICD-10-CM | POA: Diagnosis not present

## 2023-06-11 DIAGNOSIS — M47816 Spondylosis without myelopathy or radiculopathy, lumbar region: Secondary | ICD-10-CM | POA: Diagnosis not present

## 2023-06-11 DIAGNOSIS — E119 Type 2 diabetes mellitus without complications: Secondary | ICD-10-CM | POA: Insufficient documentation

## 2023-06-11 DIAGNOSIS — J449 Chronic obstructive pulmonary disease, unspecified: Secondary | ICD-10-CM | POA: Insufficient documentation

## 2023-06-11 DIAGNOSIS — Z823 Family history of stroke: Secondary | ICD-10-CM | POA: Insufficient documentation

## 2023-06-11 DIAGNOSIS — Z90721 Acquired absence of ovaries, unilateral: Secondary | ICD-10-CM | POA: Insufficient documentation

## 2023-06-11 DIAGNOSIS — Z814 Family history of other substance abuse and dependence: Secondary | ICD-10-CM | POA: Insufficient documentation

## 2023-06-11 LAB — CMP (CANCER CENTER ONLY)
ALT: 14 U/L (ref 0–44)
AST: 18 U/L (ref 15–41)
Albumin: 3.6 g/dL (ref 3.5–5.0)
Alkaline Phosphatase: 99 U/L (ref 38–126)
Anion gap: 8 (ref 5–15)
BUN: 14 mg/dL (ref 8–23)
CO2: 26 mmol/L (ref 22–32)
Calcium: 8.4 mg/dL — ABNORMAL LOW (ref 8.9–10.3)
Chloride: 105 mmol/L (ref 98–111)
Creatinine: 1.11 mg/dL — ABNORMAL HIGH (ref 0.44–1.00)
GFR, Estimated: 54 mL/min — ABNORMAL LOW (ref >60)
Glucose, Bld: 88 mg/dL (ref 70–99)
Potassium: 3.6 mmol/L (ref 3.5–5.1)
Sodium: 139 mmol/L (ref 135–145)
Total Bilirubin: 0.4 mg/dL (ref 0.0–1.2)
Total Protein: 6.2 g/dL — ABNORMAL LOW (ref 6.5–8.1)

## 2023-06-11 LAB — CBC WITH DIFFERENTIAL (CANCER CENTER ONLY)
Abs Immature Granulocytes: 0.1 10*3/uL — ABNORMAL HIGH (ref 0.00–0.07)
Basophils Absolute: 0.1 10*3/uL (ref 0.0–0.1)
Basophils Relative: 1 %
Eosinophils Absolute: 0.5 10*3/uL (ref 0.0–0.5)
Eosinophils Relative: 5 %
HCT: 41.8 % (ref 36.0–46.0)
Hemoglobin: 14 g/dL (ref 12.0–15.0)
Immature Granulocytes: 1 %
Lymphocytes Relative: 28 %
Lymphs Abs: 2.5 10*3/uL (ref 0.7–4.0)
MCH: 30.6 pg (ref 26.0–34.0)
MCHC: 33.5 g/dL (ref 30.0–36.0)
MCV: 91.3 fL (ref 80.0–100.0)
Monocytes Absolute: 0.6 10*3/uL (ref 0.1–1.0)
Monocytes Relative: 7 %
Neutro Abs: 5.1 10*3/uL (ref 1.7–7.7)
Neutrophils Relative %: 58 %
Platelet Count: 173 10*3/uL (ref 150–400)
RBC: 4.58 MIL/uL (ref 3.87–5.11)
RDW: 15 % (ref 11.5–15.5)
WBC Count: 8.8 10*3/uL (ref 4.0–10.5)
nRBC: 0 % (ref 0.0–0.2)

## 2023-06-11 MED ORDER — ONDANSETRON HCL 4 MG/2ML IJ SOLN
8.0000 mg | Freq: Once | INTRAMUSCULAR | Status: AC
Start: 2023-06-11 — End: 2023-06-11
  Administered 2023-06-11: 8 mg via INTRAVENOUS
  Filled 2023-06-11: qty 4

## 2023-06-11 MED ORDER — SODIUM CHLORIDE 0.9% FLUSH
10.0000 mL | Freq: Once | INTRAVENOUS | Status: AC
  Administered 2023-06-11: 10 mL

## 2023-06-11 MED ORDER — SODIUM CHLORIDE 0.9 % IV SOLN
1200.0000 mg | Freq: Once | INTRAVENOUS | Status: AC
  Administered 2023-06-11: 1200 mg via INTRAVENOUS
  Filled 2023-06-11: qty 20

## 2023-06-11 MED ORDER — SODIUM CHLORIDE 0.9 % IV SOLN: Freq: Once | INTRAVENOUS | Status: AC

## 2023-06-11 NOTE — Progress Notes (Signed)
 Nutrition Follow-up:  Patient with extensive stage small cell lung cancer. She is receiving maintenance therapy with atezolizumab  q21d.   Briefly met with patient in waiting area. She is asking for a warm blanket while awaiting to be called for infusion. RD provided blanket and completed follow-up at this time. Patient reports doing well overall. Her weights are stable. Reports eating better recently. She denies nausea, vomiting, diarrhea, constipation.   Medications: reviewed  Labs: reviewed  Anthropometrics: Wt 177 lb 14.4 oz today  4/24 - 178 lb 9.6 oz 4/2 - 173 lb 12.8 oz    NUTRITION DIAGNOSIS: Inadequate oral intake - improving    INTERVENTION:  Encourage high calorie high protein foods for wt maintenance Antidiarrheals as needed for diarrha    MONITORING, EVALUATION, GOAL: wt trends, intake   NEXT VISIT: Wednesday June 4 during infusion

## 2023-06-11 NOTE — Progress Notes (Signed)
 [facility] [phone]   [phone]  PROGRESS NOTE  Patient Care Team: Allwardt, Deleta Felix, PA-C as PCP - General (Physician Assistant) O'Neal, Cathay Clonts, MD as PCP - Cardiology (Cardiology) Janel Medford, MD (Inactive) as Attending Physician (Gastroenterology) Dr. Valentine Gasmen, MD as Consulting Physician (Orthopedic Surgery) Hazle Lites, MD as Consulting Physician (Orthopedic Surgery) Brian Campanile, MD (Inactive) as Consulting Physician (Neurology) Jhonny Moss, MD as Consulting Physician (Neurology) Myrle Aspen, [facility] (Inactive) (Pharmacist) [facility], [facility]   Hematological/Oncological History # Small Cell Lung Cancer, Extensive Stage 07/05/2020: CT abdomen for lower abdominal pain. New pulmonary nodular density noted 07/06/2020: CT chest showed 2.2 cm macrolobulated right lower lobe pulmonary nodule (favored) versus pathologically enlarged infrahilar lymph node 10/13/2020: PET CT scan performed, findings show 2 cm right lower lobe lung mass is hypermetabolic and consistent with primary lung neoplasm. Additionally found hypermetabolic 17 mm soft tissue lesion between the descending duodenum and the pancreatic head  10/19/2020: EGD to biopsy hypermetabolic lymph node. Biopsy results show small cell lung cancer 10/26/2020: establish care with Dr. [name]  11/13/2020: Cycle 1 Day 1 of Carbo/Etop/Atezolizumab  12/04/2020: Cycle 2 Day 1 of Carbo/Etop/Atezolizumab  11/22-11/25/2022: admitted for E. Coli bacteremia/sepsis. Start of Cycle 3 delayed. 01/02/2021: Cycle 3 Day 1 of Carbo/Etop/Atezolizumab  01/23/2021: Cycle 4 Day 1 of Carbo/Etop/Atezolizumab  02/22/2021: Cycle 5 Day 1 of Atezolizumab  Maintenance. Delayed start due to patient's COVID infection.  03/14/2021: Cycle 6 Day 1 of Atezolizumab  Maintenance 04/05/2021: Cycle 7 Day 1 of Atezolizumab  Maintenance 04/25/2021: Cycle 8 Day 1 of Atezolizumab   Maintenance 05/16/2021: Cycle 9 Day 1 of Atezolizumab  Maintenance 06/06/2021: Cycle 10 Day 1 of Atezolizumab  Maintenance 06/27/2021:  Cycle 11 Day 1 of Atezolizumab  Maintenance 07/18/2021: Cycle 12 Day 1 of Atezolizumab  Maintenance 08/08/2021: Cycle 13 Day 1 of Atezolizumab  Maintenance 08/29/2021: Cycle 14 Day 1 of Atezolizumab  Maintenance 09/19/2021: Cycle 15 Day 1 of Atezolizumab  Maintenance 10/10/2021: Cycle 16 Day 1 of Atezolizumab  Maintenance 10/31/2021: Cycle 17 Day 1 of Atezolizumab  Maintenance 11/21/2021: Cycle 18 Day 1 of Atezolizumab  Maintenance 12/12/2021: Cycle 19 Day 1 of Atezolizumab  Maintenance 01/02/2022: Cycle 20 Day 1 of Atezolizumab  Maintenance 01/23/2022: Cycle 21 Day 1 of Atezolizumab  Maintenance 02/13/2022: treatment HELD due to diarrhea.  03/06/2022: Cycle 22 Day 1 of Atezolizumab  Maintenance 03/27/2022: Cycle 23 Day 1 of Atezolizumab  Maintenance 04/17/2022: Cycle 24 Day 1 of Atezolizumab  Maintenance 05/08/2022:  Cycle 25 Day 1 of Atezolizumab  Maintenance (Held due to patient preference for treatment holiday) 05/29/2022: Cycle 25 Day 1 of Atezolizumab  Maintenance  06/19/2022: Cycle 26 Day 1 of Atezolizumab  Maintenance  07/12/2022: Cycle 27 Day 1 of Atezolizumab  Maintenance 07/31/2022: Cycle 28 Day 1 of Atezolizumab  Maintenance HELD per patient request for fatigue.  08/22/2022: Cycle 28 Day 1 of Atezolizumab  Maintenance 09/11/2022: Cycle 29 Day 1 of Atezolizumab  Maintenance 10/03/2022: Cycle 30 Day 1 of Atezolizumab  Maintenance 10/23/2022: Cycle 31 Day 1 of Atezolizumab  Maintenance 11/13/2022: Cycle 32 Day 1 of Atezolizumab  Maintenance 12/24/2022: Cycle 33 Day 1 of Atezolizumab  Maintenance 01/15/2023: Cycle 34 Day 1 of Atezolizumab  Maintenance 02/05/2023: Cycle 35 Day 1 of Atezolizumab  Maintenance 02/26/2023: Cycle 36 Day 1 of Atezolizumab  Maintenance 04/28/2023: Cycle 37 Day 1 of Atezolizumab  Maintenance 04/30/2023: Cycle 38 Day 1 of Atezolizumab  Maintenance 05/21/2023: Cycle 39 Day 1 of  Atezolizumab  Maintenance 06/11/2023: Cycle 40 Day 1 of Atezolizumab  Maintenance  Interval History:  [name] 68 y.o. female with medical history significant for extensive stage small cell lung cancer who presents for a follow up  visit. The patient's last visit was on 05/21/2023.  She presents today to start cycle 40 of maintenance atezolizumab . She is accompanied by her husband for this visit.   On exam today [name] reports that her fatigue continues to be the biggest side effect. She requires frequent resting but tries to complete her basic ADLs. Her appetite and weight are stable. She reports having nausea without vomiting that improves with zofran . She has diarrhea more than constipation but does not take any supportive medications at this time. She reports lower extremity edema has improved with compression stockings. She otherwise denies any fevers, chills, sweats, chest pain or cough. A full 10 point ROS is otherwise negative.  MEDICAL HISTORY:  Past Medical History:  Diagnosis Date   Allergic rhinitis    Anemia    Anxiety    Chicken pox    Chronic back pain    COPD (chronic obstructive pulmonary disease) (HCC)    Depression    DM (diabetes mellitus) (HCC)    Essential hypertension    GERD (gastroesophageal reflux disease)    Headache    migraines   History of gastritis    EGD 2015   History of home oxygen  therapy    2 liters at hs last 6 months   Hyperlipidemia    Hypothyroidism    Migraines    Osteoarthritis    oa   Scoliosis    Small cell lung cancer (HCC)    Stage 4    SURGICAL HISTORY: Past Surgical History:  Procedure Laterality Date   APPENDECTOMY     1985   BIOPSY  07/24/2017   Procedure: BIOPSY;  Surgeon: Janel Medford, MD;  Location: [facility];  Service: Endoscopy;;   CARDIAC CATHETERIZATION N/A 10/31/2015   Procedure: Left Heart Cath and Coronary Angiography;  Surgeon: Arleen Lacer, MD;  Location: [facility] INVASIVE CV LAB;  Service:  Cardiovascular;  Laterality: N/A;   CARPAL TUNNEL RELEASE Left    CARPAL TUNNEL RELEASE Right    CATARACT EXTRACTION W/PHACO Left 02/07/2023   Procedure: CATARACT EXTRACTION PHACO AND INTRAOCULAR LENS PLACEMENT (IOC);  Surgeon: Ardeth Krabbe, MD;  Location: [facility];  Service: Ophthalmology;  Laterality: Left;  CDE: 3.51   CATARACT EXTRACTION W/PHACO Right 02/21/2023   Procedure: CATARACT EXTRACTION PHACO AND INTRAOCULAR LENS PLACEMENT (IOC);  Surgeon: Ardeth Krabbe, MD;  Location: [facility];  Service: Ophthalmology;  Laterality: Right;  CDE: 2.94   CHOLECYSTECTOMY  late 1980's   COLONOSCOPY WITH PROPOFOL  N/A 07/24/2017   Procedure: COLONOSCOPY WITH PROPOFOL ;  Surgeon: Janel Medford, MD;  Location: [facility];  Service: Endoscopy;  Laterality: N/A;   ESOPHAGOGASTRODUODENOSCOPY N/A 07/24/2017   Procedure: ESOPHAGOGASTRODUODENOSCOPY (EGD);  Surgeon: Janel Medford, MD;  Location: [facility];  Service: Endoscopy;  Laterality: N/A;   ESOPHAGOGASTRODUODENOSCOPY (EGD) WITH PROPOFOL  N/A 10/19/2020   Procedure: ESOPHAGOGASTRODUODENOSCOPY (EGD) WITH PROPOFOL ;  Surgeon: Janel Medford, MD;  Location: [facility];  Service: Endoscopy;  Laterality: N/A;   FINE NEEDLE ASPIRATION N/A 10/19/2020   Procedure: FINE NEEDLE ASPIRATION (FNA) LINEAR;  Surgeon: Janel Medford, MD;  Location: [facility];  Service: Endoscopy;  Laterality: N/A;   GALLBLADDER SURGERY  1991   HIP CLOSED REDUCTION Right 01/08/2016   Procedure: CLOSED MANIPULATION HIP;  Surgeon: Orvan Blanch, MD;  Location: [facility];  Service: Orthopedics;  Laterality: Right;   HIP CLOSED REDUCTION Right 01/19/2016   Procedure: ATTEMPTED CLOSED REDUCTION RIGHT HIP;  Surgeon: Amada Backer, MD;  Location: [facility];  Service: Orthopedics;  Laterality: Right;   HIP CLOSED REDUCTION Right 01/20/2016   Procedure: CLOSED REDUCTION RIGHT TOTAL HIP;  Surgeon: Claiborne Crew, MD;  Location: [facility];  Service: Orthopedics;  Laterality: Right;   HIP CLOSED  REDUCTION Right 02/17/2016   Procedure: CLOSED REDUCTION RIGHT TOTAL HIP;  Surgeon: Adonica Hoose, MD;  Location: [facility];  Service: Orthopedics;  Laterality: Right;   HIP CLOSED REDUCTION Right 02/28/2016   Procedure: CLOSED REDUCTION HIP;  Surgeon: Janeth Medicus, MD;  Location: [facility];  Service: Orthopedics;  Laterality: Right;   IR IMAGING GUIDED PORT INSERTION  11/01/2020   POLYPECTOMY  07/24/2017   Procedure: POLYPECTOMY;  Surgeon: Janel Medford, MD;  Location: [facility];  Service: Endoscopy;;   TONSILLECTOMY     TOTAL ABDOMINAL HYSTERECTOMY     1985, with 1 ovary removed and 2 nd ovary removed 06-29-01   TOTAL HIP ARTHROPLASTY Right    Original surgery 06-29-04 with revision 2008-06-29   TOTAL HIP REVISION Right 01/01/2016   Procedure: TOTAL HIP REVISION;  Surgeon: Claiborne Crew, MD;  Location: [facility];  Service: Orthopedics;  Laterality: Right;   TOTAL HIP REVISION Right 03/02/2016   Procedure: TOTAL HIP REVISION;  Surgeon: Claiborne Crew, MD;  Location: [facility];  Service: Orthopedics;  Laterality: Right;   TOTAL HIP REVISION Right 09/02/2016   Procedure: Right hip constrained liner- posterior;  Surgeon: Claiborne Crew, MD;  Location: [facility];  Service: Orthopedics;  Laterality: Right;   ULNAR NERVE TRANSPOSITION Right    UPPER ESOPHAGEAL ENDOSCOPIC ULTRASOUND (EUS) N/A 10/19/2020   Procedure: UPPER ESOPHAGEAL ENDOSCOPIC ULTRASOUND (EUS);  Surgeon: Janel Medford, MD;  Location: [facility];  Service: Endoscopy;  Laterality: N/A;  periduodenal lesion    SOCIAL HISTORY: Social History   Socioeconomic History   Marital status: Married    Spouse name: Not on file   Number of children: 2   Years of education: Not on file   Highest education level: 12th grade  Occupational History   Occupation: disabled   Occupation: disabled  Tobacco Use   Smoking status: Every Day    Current packs/day: 2.00    Average packs/day: 2.0 packs/day for 46.0 years (92.0 ttl pk-yrs)    Types: Cigarettes    Smokeless tobacco: Never   Tobacco comments:    2 packs of cigarettes smoked daily 12/13/21- declines smoking cessation  Vaping Use   Vaping status: Never Used  Substance and Sexual Activity   Alcohol use: No   Drug use: No   Sexual activity: Not Currently    Partners: Male  Other Topics Concern   Not on file  Social History Narrative   Right handed    Caffeine~ 2 cups per day    Lives at home with husband (strained relationship)   Primary caretaker for disabled brother who had aneurism   Daughter died 30-Jun-2018   Social Drivers of Corporate investment banker Strain: Low Risk  (05/20/2023)   Overall Financial Resource Strain (CARDIA)    Difficulty of Paying Living Expenses: Not very hard  Food Insecurity: Food Insecurity Present (05/20/2023)   Hunger Vital Sign    Worried About Running Out of Food in the Last Year: Sometimes true    Ran Out of Food in the Last Year: Sometimes true  Transportation Needs: No Transportation Needs (05/20/2023)   PRAPARE - Administrator, Civil Service (Medical): No    Lack of Transportation (Non-Medical): No  Physical Activity: Inactive (05/20/2023)   Exercise  Vital Sign    Days of Exercise per Week: 0 days    Minutes of Exercise per Session: 0 min  Stress: No Stress Concern Present (05/20/2023)   [employer] of Occupational Health - Occupational Stress Questionnaire    Feeling of Stress : Only a little  Recent Concern: Stress - Stress Concern Present (03/31/2023)   [employer] of Occupational Health - Occupational Stress Questionnaire    Feeling of Stress : Very much  Social Connections: Moderately Integrated (05/20/2023)   Social Connection and Isolation Panel [NHANES]    Frequency of Communication with Friends and Family: More than three times a week    Frequency of Social Gatherings with Friends and Family: More than three times a week    Attends Religious Services: 1 to 4 times per year    Active Member of [employer] or  Organizations: No    Attends Banker Meetings: Never    Marital Status: Married  Catering manager Violence: Not At Risk (05/20/2023)   Humiliation, Afraid, Rape, and Kick questionnaire    Fear of Current or Ex-Partner: No    Emotionally Abused: No    Physically Abused: No    Sexually Abused: No    FAMILY HISTORY: Family History  Problem Relation Age of Onset   COPD Mother    Heart disease Mother    Lung disease Father        Asbestosis   Heart attack Father    Heart disease Father    Cerebral aneurysm Brother    Aneurysm Brother        Brain   Drug abuse Daughter    Epilepsy Son    Alcohol abuse Son    Drug abuse Son    Arthritis Maternal Grandmother    Heart disease Maternal Grandmother    Asthma Maternal Grandfather    Cancer Maternal Grandfather    Arthritis Paternal Grandmother    Heart disease Paternal Grandmother    Stroke Paternal Grandmother    Early death Paternal Grandfather    Heart disease Paternal Grandfather     ALLERGIES:  is allergic to nisoldipine, metformin  and related, nsaids, pregabalin, wellbutrin  [bupropion ], aleve [naproxen sodium], penicillins, and sulfonamide derivatives.  MEDICATIONS:  Current Outpatient Medications  Medication Sig Dispense Refill   albuterol  (PROAIR  HFA) 108 (90 Base) MCG/ACT inhaler 2 puffs every 4 hours as needed only  if your can't catch your breath 18 g 5   ALPRAZolam  (XANAX ) 1 MG tablet TAKE 1 TABLET BY MOUTH THREE TIMES DAILY AS NEEDED FOR ANXIETY 90 tablet 2   ARIPiprazole  (ABILIFY ) 5 MG tablet Take 1 tablet (5 mg total) by mouth daily. 30 tablet 5   atorvastatin  (LIPITOR) 20 MG tablet TAKE 1 TABLET BY MOUTH AT BEDTIME 90 tablet 1   benzonatate  (TESSALON ) 100 MG capsule TAKE ONE CAPSULE BY MOUTH THREE TIMES DAILY 90 capsule 1   calcipotriene  (DOVONOX) 0.005 % cream Apply to affected area bid when on break from steroid cream 120 g 3   cyclobenzaprine  (FLEXERIL ) 10 MG tablet Take 10 mg by mouth 3 (three)  times daily as needed.     dicyclomine  (BENTYL ) 10 MG capsule Take 1 capsule (10 mg total) by mouth every 8 (eight) hours as needed for spasms. Please keep your May appointment for further refills. Thank you. 60 capsule 1   diphenoxylate -atropine  (LOMOTIL ) 2.5-0.025 MG tablet TAKE 1 TABLET BY MOUTH FOUR TIMES DAILY AS NEEDED FOR diarrhea OR loose stools 60 tablet 0   DULoxetine  (CYMBALTA )  60 MG capsule TAKE TWO CAPSULES BY MOUTH EVERY DAY 180 capsule 1   FEROSUL 325 (65 Fe) MG tablet TAKE 1 TABLET BY MOUTH DAILY WITH BREAKFAST 30 tablet 3   fluconazole  (DIFLUCAN ) 150 MG tablet Take 150 mg by mouth once.     fluticasone  (FLONASE ) 50 MCG/ACT nasal spray INSTILL 2 SPRAYS IN EACH NOSTRIL EVERY DAY 16 g 6   furosemide  (LASIX ) 20 MG tablet TAKE 1 TABLET BY MOUTH TWICE DAILY, IF SWELLING IMPROVES CAN HOLD MEDICATION 90 tablet 0   HYDROcodone -acetaminophen  (NORCO) 10-325 MG tablet Take 1 tablet by mouth every 4 (four) hours as needed. Needs to be filled before 5/9- because I increased dose to 6 pills /day from 5 pills/day- - on max dose from now on 180 tablet 0   hydrOXYzine  (ATARAX ) 10 MG tablet TAKE 1 TABLET BY MOUTH THREE TIMES DAILY AS NEEDED FOR ITCHING 30 tablet 2   levothyroxine  (SYNTHROID ) 137 MCG tablet Take 1 tablet (137 mcg total) by mouth daily before breakfast. 90 tablet 3   loratadine  (CLARITIN ) 10 MG tablet Take 10 mg by mouth daily.     magic mouthwash (nystatin , diphenhydrAMINE , alum & mag hydroxide) suspension mixture Swish and spit 5 mLs 3 (three) times daily as needed. 180 mL 0   meclizine  (ANTIVERT ) 12.5 MG tablet TAKE 1 TABLET BY MOUTH THREE TIMES DAILY AS NEEDED FOR DIZZINESS 30 tablet 1   meloxicam  (MOBIC ) 15 MG tablet TAKE 1 TABLET BY MOUTH EVERY DAY 30 tablet 2   OLANZapine  (ZYPREXA ) 10 MG tablet Take 1 tablet (10 mg total) by mouth at bedtime. 30 tablet 2   omeprazole  (PRILOSEC) 40 MG capsule TAKE ONE CAPSULE BY MOUTH TWICE DAILY 180 capsule 1   ondansetron  (ZOFRAN ) 8 MG tablet  TAKE 1 TABLET BY MOUTH TWICE DAILY 60 tablet 1   permethrin  (ELIMITE ) 5 % cream      potassium chloride  SA (KLOR-CON  M) 20 MEQ tablet TAKE 1 TABLET BY MOUTH TWICE DAILY (Patient taking differently: Take 20 mEq by mouth daily. Patient states that she adjusted the dose herself.) 60 tablet 2   prochlorperazine  (COMPAZINE ) 10 MG tablet Take 1 tablet (10 mg total) by mouth every 6 (six) hours as needed for nausea or vomiting. 30 tablet 0   Rimegepant Sulfate (NURTEC) 75 MG TBDP Take 1 tablet (75 mg total) by mouth daily as needed (for migraine-). Take within 15 minutes of initial symptoms 8 tablet 5   topiramate  (TOPAMAX ) 100 MG tablet Take 1 tablet (100 mg total) by mouth at bedtime. Changed to 100 mg tabs- for ease 90 tablet 1   triamcinolone  ointment (KENALOG ) 0.5 % Apply 1 Application topically 3 (three) times daily. Apply thin-layer on affected areas, no more than 2 weeks consistently. 60 g 2   triamterene -hydrochlorothiazide  (MAXZIDE -25) 37.5-25 MG tablet Take 1 tablet by mouth daily.     umeclidinium-vilanterol (ANORO ELLIPTA ) 62.5-25 MCG/ACT AEPB Inhale 1 puff into the lungs daily. 60 each 11   azithromycin  (ZITHROMAX ) 250 MG tablet Take two tablets on day 1, then one tablet daily on day 2-5. (Patient not taking: Reported on 06/11/2023) 6 tablet 0   No current facility-administered medications for this visit.   Facility-Administered Medications Ordered in Other Visits  Medication Dose Route Frequency Provider Last Rate Last Admin   atezolizumab  (TECENTRIQ ) 1,200 mg in sodium chloride  0.9 % 250 mL chemo infusion  1,200 mg Intravenous Once Dorsey, John T IV, MD        REVIEW OF SYSTEMS:  Constitutional: ( - ) fevers, ( - )  chills , ( - ) night sweats Eyes: ( - ) blurriness of vision, ( - ) double vision, ( - ) watery eyes Ears, nose, mouth, throat, and face: ( - ) mucositis, ( - ) sore throat Respiratory: ( - ) cough, ( -) dyspnea, ( - ) wheezes Cardiovascular: ( - ) palpitation, ( - ) chest  discomfort, ( - ) lower extremity swelling Gastrointestinal:  ( +) nausea, ( - ) heartburn, ( - ) change in bowel habits Skin: ( - ) abnormal skin rashes Lymphatics: ( - ) new lymphadenopathy, ( - ) easy bruising Neurological: (+ ) numbness, ( - ) tingling, ( - ) new weaknesses Behavioral/Psych: ( - ) mood change, ( - ) new changes  All other systems were reviewed with the patient and are negative.  PHYSICAL EXAMINATION: ECOG PERFORMANCE STATUS: 1 - Symptomatic but completely ambulatory  Vitals:   06/11/23 1140  BP: (!) 143/89  Pulse: 92  Resp: 15  Temp: 98 F (36.7 C)  SpO2: 98%   Filed Weights   06/11/23 1140  Weight: 177 lb 14.4 oz (80.7 kg)    GENERAL: Well-appearing middle-age Caucasian female, alert, no distress and comfortable SKIN: skin color, texture, turgor are normal, no rashes or significant lesions EYES: conjunctiva are pink and non-injected, sclera clear LUNGS:  normal breathing effort. Diffuse wheezing hear on ausculation.  HEART: regular rate & rhythm and no murmurs. Mild bilateral lower extremity edema Musculoskeletal: no cyanosis of digits and no clubbing  PSYCH: alert & oriented x 3, fluent speech NEURO: no focal motor/sensory deficits  LABORATORY DATA:  I have reviewed the data as listed    Latest Ref Rng & Units 06/11/2023   10:57 AM 05/21/2023    9:47 AM 04/30/2023   10:22 AM  CBC  WBC 4.0 - 10.5 K/uL 8.8  7.1  7.2   Hemoglobin 12.0 - 15.0 g/dL 60.4  54.0  98.1   Hematocrit 36.0 - 46.0 % 41.8  40.1  41.5   Platelets 150 - 400 K/uL 173  169  174        Latest Ref Rng & Units 06/11/2023   10:57 AM 05/21/2023    9:47 AM 04/30/2023   10:22 AM  CMP  Glucose 70 - 99 mg/dL 88  191  478   BUN 8 - 23 mg/dL 14  12  9    Creatinine 0.44 - 1.00 mg/dL 2.95  6.21  3.08   Sodium 135 - 145 mmol/L 139  136  132   Potassium 3.5 - 5.1 mmol/L 3.6  3.7  3.4   Chloride 98 - 111 mmol/L 105  103  99   CO2 22 - 32 mmol/L 26  28  27    Calcium  8.9 - 10.3 mg/dL 8.4  8.4   8.6   Total Protein 6.5 - 8.1 g/dL 6.2  6.0  6.1   Total Bilirubin 0.0 - 1.2 mg/dL 0.4  0.4  0.5   Alkaline Phos 38 - 126 U/L 99  76  67   AST 15 - 41 U/L 18  25  15    ALT 0 - 44 U/L 14  23  9      No results found for: "MPROTEIN" Lab Results  Component Value Date   KPAFRELGTCHN 0.75 06/30/2014   LAMBDASER 3.78 (H) 06/30/2014   KAPLAMBRATIO 0.20 (L) 06/30/2014     RADIOGRAPHIC STUDIES: MR Lumbar Spine W Wo Contrast Result Date: 05/29/2023 CLINICAL  DATA:  Initial evaluation for acute myelopathy, left foot drop. History of metastatic lung cancer. EXAM: MRI LUMBAR SPINE WITHOUT AND WITH CONTRAST TECHNIQUE: Multiplanar and multiecho pulse sequences of the lumbar spine were obtained without and with intravenous contrast. CONTRAST:  7mL GADAVIST  GADOBUTROL  1 MMOL/ML IV SOLN COMPARISON:  Prior MRI from 11/20/2013. FINDINGS: Segmentation: Standard. Lowest well-formed disc space labeled the L5-S1 level. Alignment: Moderate levoscoliosis, apex at L2-3, progressed from prior. 3 mm retrolisthesis of L1 on L2 and L2 on L3, with 4 mm anterolisthesis of L4 on L5, chronic and facet mediated. Vertebrae: Vertebral body height maintained without acute or chronic fracture. Bone marrow signal intensity heterogeneous but overall within normal limits. No worrisome osseous lesions or evidence for osseous metastatic disease. Degenerative reactive endplate change present about the L1-2 and L2-3 interspaces related underlying scoliotic curvature. Reactive marrow edema present about the L4-5 facets due to facet arthritis. Conus medullaris and cauda equina: Conus extends to the L1 level. Conus and cauda equina appear normal. Paraspinal and other soft tissues: Paraspinous soft tissues demonstrate no acute finding. There is question of a exophytic T2 hypointense lesion measuring 1.5 cm arising from the posterior right kidney (series 5, image 9), not well seen on corresponding sequences. Disc levels: T9-10: Negative interspace.  Bilateral facet arthrosis. No spinal stenosis. Moderate to advanced left foraminal stenosis. Mild right foraminal narrowing. T10-11: Disc desiccation with minimal disc bulge. Moderate to advanced left worse than right facet arthrosis. No spinal stenosis. Moderate to advanced left with moderate right foraminal stenosis. T11-12: Disc desiccation without significant disc bulge. Mild right with advanced left facet arthrosis. No spinal stenosis. Moderate to severe left foraminal narrowing. Right neural foramen remains patent. T12-L1: Minimal disc bulge with endplate spurring. Mild facet hypertrophy. No significant stenosis. L1-2: Degenerative disc space narrowing with diffuse disc bulge and disc desiccation. Reactive endplate spurring. Mild bilateral facet hypertrophy. No spinal stenosis. Foramina remain patent L2-3: Degenerative vertebral disc space narrowing with reactive endplate spurring, asymmetric to the right. Moderate bilateral facet hypertrophy. Resultant mild narrowing of the right lateral recess. Central canal remains patent. Moderate right L2 foraminal stenosis. Left neural foramina remains patent. L3-4: Mild diffuse disc bulge. Superimposed small left foraminal to extraforaminal disc protrusion closely approximates the exiting left L3 nerve root (series 5, image 23). Moderate facet hypertrophy. No significant spinal stenosis. Foramina remain patent. L4-5: Trace anterolisthesis. Disc desiccation with mild disc bulge. Advanced bilateral facet arthrosis. Resultant mild canal with left lateral recess stenosis. Foramina remain patent. L5-S1: Degenerative intervertebral disc space narrowing with disc desiccation and diffuse disc bulge. Reactive endplate spurring with marginal endplate osteophytic spurring, worse on the left. Moderate left with mild right facet arthrosis. No significant spinal stenosis. Moderate left with mild right L5 foraminal narrowing. IMPRESSION: 1. No acute abnormality within the lumbar  spine. No evidence for metastatic disease. 2. Moderate levoscoliosis with multilevel lumbar spondylosis and facet arthrosis. Resultant mild canal and left lateral recess stenosis at L4-5. 3. Small left foraminal to extraforaminal disc protrusion at L3-4, closely approximating and potentially irritating the exiting left L3 nerve root. 4. Moderate to advanced multilevel facet hypertrophy throughout the visualized lower thoracic and lumbar spine. Resultant moderate to advanced left-sided foraminal narrowing at T9-10 through T11-12, with moderate left L5 foraminal stenosis. 5. Question 1.5 cm exophytic T2 hypointense lesion arising from the posterior right kidney. Further evaluation with dedicated renal ultrasound recommended. Electronically Signed   By: Virgia Griffins M.D.   On: 05/29/2023 18:18   VAS US  LOWER EXTREMITY  VENOUS REFLUX Result Date: 05/29/2023  Lower Venous Reflux Study Patient Name:  [name]  Date of Exam:   05/29/2023 Medical Rec #: [id number]        Accession #:    [id number] Date of Birth: [birth year]         Patient Gender: F Patient Age:   11 years Exam Location:  [address] Procedure:      VAS US  LOWER EXTREMITY VENOUS REFLUX Referring Phys: Runell Countryman --------------------------------------------------------------------------------  Other Indications: Patient presents with bothersome varicose veins of the right                    lower extremity. She reports that it has been there for many                    years but seems to be getting worse. Performing Technologist: Harless Lien RVT  Examination Guidelines: A complete evaluation includes B-mode imaging, spectral Doppler, color Doppler, and power Doppler as needed of all accessible portions of each vessel. Bilateral testing is considered an integral part of a complete examination. Limited examinations for reoccurring indications may be performed as noted. The reflux portion of the exam is performed with the patient in  reverse Trendelenburg. Significant venous reflux is defined as >500 ms in the superficial venous system, and >1 second in the deep venous system.  Venous Reflux Times +---------------+---------+------+-----------+------------+--------+ RIGHT          Reflux NoRefluxReflux TimeDiameter cmsComments                          Yes                                  +---------------+---------+------+-----------+------------+--------+ CFV            no                                             +---------------+---------+------+-----------+------------+--------+ FV mid         no                                             +---------------+---------+------+-----------+------------+--------+ Popliteal      no                                             +---------------+---------+------+-----------+------------+--------+ GSV at SFJ                                   0.66             +---------------+---------+------+-----------+------------+--------+ GSV prox thigh           yes                 0.44             +---------------+---------+------+-----------+------------+--------+ GSV mid thigh            yes  0.41             +---------------+---------+------+-----------+------------+--------+ GSV dist thigh           yes                 0.34             +---------------+---------+------+-----------+------------+--------+ GSV at knee    no                            0.22             +---------------+---------+------+-----------+------------+--------+ GSV prox calf            yes                 0.18             +---------------+---------+------+-----------+------------+--------+ GSV mid calf             yes                 0.26             +---------------+---------+------+-----------+------------+--------+ SSV prox calf  no                                              +---------------+---------+------+-----------+------------+--------+ Anterior acc. v          yes    >500 ms      0.50             +---------------+---------+------+-----------+------------+--------+   Summary: Right: - No evidence of deep vein thrombosis seen in the right lower extremity, from the common femoral through the popliteal veins. - No evidence of superficial venous thrombosis in the right lower extremity. - Venous reflux is noted in the right greater saphenous vein in the thigh. - Venous reflux is noted in the right greater saphenous vein in the calf. - Venous reflux is noted in the right perforator vein.  *See table(s) above for measurements and observations. Electronically signed by Irvin Mantel on 05/29/2023 at 5:11:16 PM.    Final    DG Chest 2 View Result Date: 05/22/2023 CLINICAL DATA:  Left-sided chest pain after recent fall. History of small-cell lung cancer. EXAM: CHEST - 2 VIEW COMPARISON:  X-ray and CT 04/17/2023. FINDINGS: Right IJ chest port. Catheter tip extends to the SVC right atrial junction region. No consolidation, pneumothorax or effusion. No edema. Normal cardiopericardial silhouette. Calcified aorta. Curvature of spine with degenerative changes. Surgical clips in the upper abdomen. IMPRESSION: Chest port. Underinflation. No acute cardiopulmonary disease. Further evaluation as clinically appropriate. Electronically Signed   By: Adrianna Horde M.D.   On: 05/22/2023 15:28     ASSESSMENT & PLAN MONISHA DELEO 68 y.o. female with medical history significant for extensive stage small cell lung cancer who presents for a follow up visit.   After review of the labs, review of the records, and discussion with the patient the patients findings are most consistent with extensive stage small cell lung cancer with metastasis from the right lower lobe to the lymph nodes of the abdomen.  At this time we will pursue triple therapy with carboplatin , etoposide , and atezolizumab .  After  4 cycles we will convert to maintenance atezolizumab  alone.  We previously discussed the risks and benefits of this therapy and the patient was in agreement  to proceed with this treatment.  The treatment of choice consist of carboplatin , etoposide , and atezolizumab .  The regimen consists of carboplatin  AUC of 5 IV on day 1, etoposide  100 mg per metered squared IV on day 1, 2, and 3 and atezolizumab  1200 mg on day 1.  This continues for 21-day cycles.  After 4 cycles the patient proceeds with atezolizumab  maintenance therapy alone.    # Small Cell Lung Cancer, Extensive Stage -- MRI of the brain shows no evidence of intracranial spread --Findings are currently consistent with metastatic small cell lung cancer with metastatic spread to the lymph nodes of the abdomen -- Started carboplatin , etoposide , and atezolizumab  on 11/13/2020. Transitioned to maintenance Atezolizumab  on 02/22/2021.  Plan: --today is Cycle 40 Day 1 of Atezolizumab  maintenance.  --labs today were reviewed and adequate for treatment. WBC 8.8, Hgb 14.0, Plt 173, creatinine stable at 1.11, LFTs normal. -- Most recent CT CAP from 04/17/2023 which shows no evidence of recurrence. Repeat scan due in June 2025.   --Proceed with treatment without any dose modifications.  --RTC in 3 weeks for a follow up before Cycle 41  # Early Cirrhosis --noted on MR abdomen from 05/22/2022. US  with elastography performed on 06/18/2022. --continue to follow with GI.   #Right renal lesion: --Noted to be benign on most recent MRI of the abdomen.  #Diarrhea-improved --stool sent for C. Diff and GI pathology panel, all negative --patient following with GI and found to have pancreatic insufficiency and started on creon  with improvement of symptoms.   #Nausea: --Symptoms improved with zofran  and olanzapine .  --Added IV zofran  to infusion premeds for better symptom control.   #Hypokalemia:  --currently taking potassium chloride  20 meq BID.    --potassium level is 3.6 today. Continue PO supplementation.   #Lower extremity edema --Currently takes lasix  20 mg 1-2 times a day. --Improved with compression stockings.   #Neuropathy involving fingers/feet: --Patient currently takes Cymbalta    #Supportive Care -- chemotherapy education complete -- port placed -- zofran  8mg  q8H PRN and compazine  10mg  PO q6H for nausea -- EMLA  cream for port  No orders of the defined types were placed in this encounter.  All questions were answered. The patient knows to call the clinic with any problems, questions or concerns.  I have spent a total of 30 minutes minutes of face-to-face and non-face-to-face time, preparing to see the patient, performing a medically appropriate examination, counseling and educating the patient, ordering medications/tests, documenting clinical information in the electronic health record, and care coordination.   Wyline Hearing PA-C Dept of Hematology and Oncology [facility] Phone: [phone]  06/11/2023 1:02 PM

## 2023-06-11 NOTE — Patient Instructions (Signed)
 [facility] - A DEPT OF [facility]  Discharge Instructions: Thank you for choosing [facility] to provide your oncology and hematology care.   If you have a lab appointment with the Cancer Center, please go directly to the Cancer Center and check in at the registration area.   Wear comfortable clothing and clothing appropriate for easy access to any Portacath or PICC line.   We strive to give you quality time with your provider. You may need to reschedule your appointment if you arrive late (15 or more minutes).  Arriving late affects you and other patients whose appointments are after yours.  Also, if you miss three or more appointments without notifying the office, you may be dismissed from the clinic at the provider's discretion.      For prescription refill requests, have your pharmacy contact our office and allow 72 hours for refills to be completed.    Today you received the following chemotherapy and/or immunotherapy agents: atezolizumab      To help prevent nausea and vomiting after your treatment, we encourage you to take your nausea medication as directed.  BELOW ARE SYMPTOMS THAT SHOULD BE REPORTED IMMEDIATELY: *FEVER GREATER THAN 100.4 F (38 C) OR HIGHER *CHILLS OR SWEATING *NAUSEA AND VOMITING THAT IS NOT CONTROLLED WITH YOUR NAUSEA MEDICATION *UNUSUAL SHORTNESS OF BREATH *UNUSUAL BRUISING OR BLEEDING *URINARY PROBLEMS (pain or burning when urinating, or frequent urination) *BOWEL PROBLEMS (unusual diarrhea, constipation, pain near the anus) TENDERNESS IN MOUTH AND THROAT WITH OR WITHOUT PRESENCE OF ULCERS (sore throat, sores in mouth, or a toothache) UNUSUAL RASH, SWELLING OR PAIN  UNUSUAL VAGINAL DISCHARGE OR ITCHING   Items with * indicate a potential emergency and should be followed up as soon as possible or go to the Emergency Department if any problems should occur.  Please show the CHEMOTHERAPY ALERT CARD or  IMMUNOTHERAPY ALERT CARD at check-in to the Emergency Department and triage nurse.  Should you have questions after your visit or need to cancel or reschedule your appointment, please contact [facility] - A DEPT OF [facility]  Dept: [phone]  and follow the prompts.  Office hours are 8:00 a.m. to 4:30 p.m. Monday - Friday. Please note that voicemails left after 4:00 p.m. may not be returned until the following business day.  We are closed weekends and major holidays. You have access to a nurse at all times for urgent questions. Please call the main number to the clinic Dept: [phone] and follow the prompts.   For any non-urgent questions, you may also contact your provider using MyChart. We now offer e-Visits for anyone 44 and older to request care online for non-urgent symptoms. For details visit [email or web address].   Also download the MyChart app! Go to the app store, search "MyChart", open the app, select [facility], and log in with your MyChart username and password.

## 2023-06-12 ENCOUNTER — Other Ambulatory Visit: Payer: Self-pay | Admitting: Physical Medicine and Rehabilitation

## 2023-06-12 LAB — TSH: TSH: 1.64 u[IU]/mL (ref 0.350–4.500)

## 2023-06-12 LAB — T4: T4, Total: 9.6 ug/dL (ref 4.5–12.0)

## 2023-06-16 ENCOUNTER — Other Ambulatory Visit: Payer: Self-pay | Admitting: Physician Assistant

## 2023-06-16 DIAGNOSIS — F32A Depression, unspecified: Secondary | ICD-10-CM

## 2023-06-16 NOTE — Telephone Encounter (Signed)
 Last OV: 05/08/23  Next OV: 08/07/23  Last Filled: 03/28/23  Quantity: 90 w/ 2 refills

## 2023-06-18 ENCOUNTER — Ambulatory Visit: Payer: 59 | Admitting: Gastroenterology

## 2023-06-20 ENCOUNTER — Other Ambulatory Visit: Payer: Self-pay | Admitting: Hematology and Oncology

## 2023-06-21 ENCOUNTER — Other Ambulatory Visit: Payer: Self-pay | Admitting: Physician Assistant

## 2023-06-29 ENCOUNTER — Other Ambulatory Visit: Payer: Self-pay | Admitting: Physical Medicine and Rehabilitation

## 2023-06-29 ENCOUNTER — Other Ambulatory Visit: Payer: Self-pay | Admitting: Physician Assistant

## 2023-06-29 DIAGNOSIS — F32A Depression, unspecified: Secondary | ICD-10-CM

## 2023-06-29 DIAGNOSIS — G43E19 Chronic migraine with aura, intractable, without status migrainosus: Secondary | ICD-10-CM

## 2023-06-30 NOTE — Telephone Encounter (Signed)
 Last OV: 05/08/23  Next OV: 08/07/23  Last Filled: 03/28/23 written; filled 05/27/23 per pharmacy  Quantity: 90 w/ 2 refills

## 2023-07-01 ENCOUNTER — Other Ambulatory Visit: Payer: Self-pay | Admitting: Gastroenterology

## 2023-07-01 ENCOUNTER — Telehealth: Payer: Self-pay | Admitting: Hematology and Oncology

## 2023-07-01 ENCOUNTER — Telehealth: Payer: Self-pay | Admitting: *Deleted

## 2023-07-01 NOTE — Telephone Encounter (Signed)
 [name] informing office that she'd called [facility] scheduling to cancel all appts currently scheduled on 07/02/23: Lab w/port flush; MD; Infusion; nutrition.   TCT to [name] about cancelling all appts for tomorrow. 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 She states he has short tem memory loss and won't keep his oxygen  on and keeps trying to smoke in the house She also said she is always tired for 3 weeks after treatment and just can't do that right now.  She plans to get her CT on 6/9 (she's arranged someone to sit with her brother). She said she would wait to see Dr. [name] until appts scheduled on 6/25. By then, she will have others helping care for her brother.    Dr. [name] informed of patient's message. He acknowledged information from patient. He said if she needed or wanted to see him between 6/9 CT and 6/25 appts to contact the office.  TCT patient with Dr. [name] response - she said she will call if appt needed before 6/25.

## 2023-07-02 ENCOUNTER — Inpatient Hospital Stay

## 2023-07-02 ENCOUNTER — Inpatient Hospital Stay: Admitting: Physician Assistant

## 2023-07-02 ENCOUNTER — Inpatient Hospital Stay: Admitting: Dietician

## 2023-07-03 ENCOUNTER — Other Ambulatory Visit: Payer: Self-pay | Admitting: Physical Medicine and Rehabilitation

## 2023-07-04 ENCOUNTER — Other Ambulatory Visit: Payer: Self-pay | Admitting: Physician Assistant

## 2023-07-04 NOTE — Telephone Encounter (Signed)
 Please advise on refill, previously discontinued by another office

## 2023-07-05 ENCOUNTER — Other Ambulatory Visit: Payer: Self-pay | Admitting: Hematology and Oncology

## 2023-07-05 DIAGNOSIS — R600 Localized edema: Secondary | ICD-10-CM

## 2023-07-07 ENCOUNTER — Ambulatory Visit (HOSPITAL_COMMUNITY)
Admission: RE | Admit: 2023-07-07 | Discharge: 2023-07-07 | Disposition: A | Discharge status: Home / Self Care | Source: Ambulatory Visit | Attending: Physician Assistant | Admitting: Physician Assistant

## 2023-07-07 ENCOUNTER — Encounter (HOSPITAL_COMMUNITY): Payer: Self-pay

## 2023-07-07 ENCOUNTER — Encounter: Payer: Self-pay | Admitting: Hematology and Oncology

## 2023-07-07 DIAGNOSIS — Z9049 Acquired absence of other specified parts of digestive tract: Secondary | ICD-10-CM | POA: Diagnosis not present

## 2023-07-07 DIAGNOSIS — C3431 Malignant neoplasm of lower lobe, right bronchus or lung: Secondary | ICD-10-CM | POA: Diagnosis not present

## 2023-07-07 DIAGNOSIS — J439 Emphysema, unspecified: Secondary | ICD-10-CM | POA: Diagnosis not present

## 2023-07-07 MED ORDER — IOHEXOL 300 MG/ML  SOLN
100.0000 mL | Freq: Once | INTRAMUSCULAR | Status: AC | PRN
  Administered 2023-07-07: 100 mL via INTRAVENOUS

## 2023-07-07 MED ORDER — SODIUM CHLORIDE (PF) 0.9 % IJ SOLN
INTRAMUSCULAR | Status: AC
  Filled 2023-07-07: qty 50

## 2023-07-10 ENCOUNTER — Ambulatory Visit: Payer: Self-pay | Admitting: *Deleted

## 2023-07-10 NOTE — Telephone Encounter (Signed)
-----   Message from Rogerio Clay IV sent at 07/09/2023  8:40 AM EDT ----- Please let Ms. Heiner know that her CT scan looks excellent.  There is no evidence of recurrent disease.  We will plan to see her back as scheduled later this month. ----- Message ----- From: Portia Brittle Sent: 07/08/2023   3:38 PM EDT To: Ander Bame, MD  You are seeing on 6/25. ----- Message ----- From: Germaine Kohut Results In Sent: 07/07/2023   6:37 PM EDT To: Darilyn Edin, PA-C

## 2023-07-10 NOTE — Telephone Encounter (Signed)
 TCT patient regarding recent scan results.No answer but was able to leave vm message on her identified phone vm.  Advised that her CT scan looks excellent. There is no evidence of recurrent disease. We will plan to see her back as scheduled later this month. Advised that she can call back with any questions or concerns to [phone]

## 2023-07-15 ENCOUNTER — Other Ambulatory Visit: Payer: Self-pay | Admitting: Physician Assistant

## 2023-07-15 NOTE — Telephone Encounter (Signed)
 Copied from CRM [id number]. Topic: Clinical - Medication Refill >> Jul 15, 2023  2:37 PM Trula Gable C wrote: Medication: triamterene -hydrochlorothiazide  (MAXZIDE -25) 37.5-25 MG tablet  Has the patient contacted their pharmacy? Yes (Agent: If no, request that the patient contact the pharmacy for the refill. If patient does not wish to contact the pharmacy document the reason why and proceed with request.) (Agent: If yes, when and what did the pharmacy advise?)  This is the patient's preferred pharmacy:  [facility] - [place], [place] - [address] [place] [address] Phone: [phone] Fax: [phone]    Is this the correct pharmacy for this prescription? Yes If no, delete pharmacy and type the correct one.   Has the prescription been filled recently? No  Is the patient out of the medication? Yes  Has the patient been seen for an appointment in the last year OR does the patient have an upcoming appointment? Yes  Can we respond through MyChart? Yes  Agent: Please be advised that Rx refills may take up to 3 business days. We ask that you follow-up with your pharmacy.

## 2023-07-21 ENCOUNTER — Telehealth: Payer: Self-pay | Admitting: *Deleted

## 2023-07-21 NOTE — Telephone Encounter (Signed)
 Received call from pt requesting to cancel her appts for this week as she is taking care of her brother who may have a new cancer diagnosis. Advised that we will need to reschedule next week as she will have missed 2 treatments. Dr. Federico made aware. Scheduling message sent.

## 2023-07-23 ENCOUNTER — Telehealth: Payer: Self-pay | Admitting: Hematology and Oncology

## 2023-07-23 ENCOUNTER — Inpatient Hospital Stay: Admitting: Dietician

## 2023-07-23 ENCOUNTER — Inpatient Hospital Stay: Admitting: Hematology and Oncology

## 2023-07-23 ENCOUNTER — Ambulatory Visit

## 2023-07-23 ENCOUNTER — Inpatient Hospital Stay

## 2023-07-23 NOTE — Telephone Encounter (Signed)
 Called to get the patient scheduled per message from the nurse. Left VM for the patient to call back so she can be scheduled for labs and a follow up appointment.

## 2023-07-23 NOTE — Telephone Encounter (Signed)
 Scheduled appointment per 6/23 secure chat from the nurse. Called and left a VM with appointment details for 6/30, but also asked the patient to call back so she can be scheduled for labs and a clinic visit.

## 2023-07-24 ENCOUNTER — Telehealth: Payer: Self-pay | Admitting: Hematology and Oncology

## 2023-07-24 NOTE — Telephone Encounter (Signed)
 Scheduled appointment per 6/23 secure chat. Talked with the patient and she is aware of the made appointments.

## 2023-07-25 ENCOUNTER — Other Ambulatory Visit: Payer: Self-pay | Admitting: Physician Assistant

## 2023-07-28 ENCOUNTER — Other Ambulatory Visit: Payer: Self-pay | Admitting: Physical Medicine and Rehabilitation

## 2023-07-28 ENCOUNTER — Inpatient Hospital Stay: Attending: Hematology and Oncology | Admitting: Physician Assistant

## 2023-07-28 ENCOUNTER — Inpatient Hospital Stay

## 2023-07-28 ENCOUNTER — Telehealth: Payer: Self-pay | Admitting: Registered Nurse

## 2023-07-28 VITALS — BP 163/89 | HR 86 | Temp 97.0°F | Resp 20 | Wt 185.1 lb

## 2023-07-28 DIAGNOSIS — E876 Hypokalemia: Secondary | ICD-10-CM | POA: Diagnosis not present

## 2023-07-28 DIAGNOSIS — C3431 Malignant neoplasm of lower lobe, right bronchus or lung: Secondary | ICD-10-CM

## 2023-07-28 DIAGNOSIS — Z79899 Other long term (current) drug therapy: Secondary | ICD-10-CM | POA: Insufficient documentation

## 2023-07-28 DIAGNOSIS — R5383 Other fatigue: Secondary | ICD-10-CM | POA: Diagnosis not present

## 2023-07-28 DIAGNOSIS — E785 Hyperlipidemia, unspecified: Secondary | ICD-10-CM | POA: Insufficient documentation

## 2023-07-28 DIAGNOSIS — Z823 Family history of stroke: Secondary | ICD-10-CM | POA: Insufficient documentation

## 2023-07-28 DIAGNOSIS — E039 Hypothyroidism, unspecified: Secondary | ICD-10-CM | POA: Insufficient documentation

## 2023-07-28 DIAGNOSIS — G8929 Other chronic pain: Secondary | ICD-10-CM | POA: Diagnosis not present

## 2023-07-28 DIAGNOSIS — Z886 Allergy status to analgesic agent status: Secondary | ICD-10-CM | POA: Insufficient documentation

## 2023-07-28 DIAGNOSIS — Z90710 Acquired absence of both cervix and uterus: Secondary | ICD-10-CM | POA: Diagnosis not present

## 2023-07-28 DIAGNOSIS — C799 Secondary malignant neoplasm of unspecified site: Secondary | ICD-10-CM | POA: Diagnosis not present

## 2023-07-28 DIAGNOSIS — F17210 Nicotine dependence, cigarettes, uncomplicated: Secondary | ICD-10-CM | POA: Insufficient documentation

## 2023-07-28 DIAGNOSIS — R600 Localized edema: Secondary | ICD-10-CM | POA: Insufficient documentation

## 2023-07-28 DIAGNOSIS — Z882 Allergy status to sulfonamides status: Secondary | ICD-10-CM | POA: Insufficient documentation

## 2023-07-28 DIAGNOSIS — M5410 Radiculopathy, site unspecified: Secondary | ICD-10-CM | POA: Diagnosis not present

## 2023-07-28 DIAGNOSIS — Z90721 Acquired absence of ovaries, unilateral: Secondary | ICD-10-CM | POA: Insufficient documentation

## 2023-07-28 DIAGNOSIS — I10 Essential (primary) hypertension: Secondary | ICD-10-CM | POA: Insufficient documentation

## 2023-07-28 DIAGNOSIS — Z880 Allergy status to penicillin: Secondary | ICD-10-CM | POA: Insufficient documentation

## 2023-07-28 DIAGNOSIS — R1030 Lower abdominal pain, unspecified: Secondary | ICD-10-CM | POA: Diagnosis not present

## 2023-07-28 DIAGNOSIS — F32A Depression, unspecified: Secondary | ICD-10-CM | POA: Diagnosis not present

## 2023-07-28 DIAGNOSIS — Z9049 Acquired absence of other specified parts of digestive tract: Secondary | ICD-10-CM | POA: Diagnosis not present

## 2023-07-28 DIAGNOSIS — R296 Repeated falls: Secondary | ICD-10-CM | POA: Diagnosis not present

## 2023-07-28 DIAGNOSIS — Z809 Family history of malignant neoplasm, unspecified: Secondary | ICD-10-CM | POA: Insufficient documentation

## 2023-07-28 DIAGNOSIS — J449 Chronic obstructive pulmonary disease, unspecified: Secondary | ICD-10-CM | POA: Diagnosis not present

## 2023-07-28 DIAGNOSIS — Z8719 Personal history of other diseases of the digestive system: Secondary | ICD-10-CM | POA: Diagnosis not present

## 2023-07-28 DIAGNOSIS — Z8261 Family history of arthritis: Secondary | ICD-10-CM | POA: Insufficient documentation

## 2023-07-28 DIAGNOSIS — Z825 Family history of asthma and other chronic lower respiratory diseases: Secondary | ICD-10-CM | POA: Insufficient documentation

## 2023-07-28 DIAGNOSIS — Z8616 Personal history of COVID-19: Secondary | ICD-10-CM | POA: Insufficient documentation

## 2023-07-28 DIAGNOSIS — E119 Type 2 diabetes mellitus without complications: Secondary | ICD-10-CM | POA: Insufficient documentation

## 2023-07-28 DIAGNOSIS — Z5112 Encounter for antineoplastic immunotherapy: Secondary | ICD-10-CM | POA: Diagnosis not present

## 2023-07-28 DIAGNOSIS — Z13220 Encounter for screening for lipoid disorders: Secondary | ICD-10-CM | POA: Diagnosis not present

## 2023-07-28 DIAGNOSIS — F419 Anxiety disorder, unspecified: Secondary | ICD-10-CM | POA: Diagnosis not present

## 2023-07-28 DIAGNOSIS — Z1329 Encounter for screening for other suspected endocrine disorder: Secondary | ICD-10-CM | POA: Diagnosis not present

## 2023-07-28 DIAGNOSIS — C3490 Malignant neoplasm of unspecified part of unspecified bronchus or lung: Secondary | ICD-10-CM | POA: Diagnosis not present

## 2023-07-28 DIAGNOSIS — Z8249 Family history of ischemic heart disease and other diseases of the circulatory system: Secondary | ICD-10-CM | POA: Insufficient documentation

## 2023-07-28 DIAGNOSIS — Z888 Allergy status to other drugs, medicaments and biological substances status: Secondary | ICD-10-CM | POA: Diagnosis not present

## 2023-07-28 DIAGNOSIS — Z814 Family history of other substance abuse and dependence: Secondary | ICD-10-CM | POA: Insufficient documentation

## 2023-07-28 DIAGNOSIS — M79671 Pain in right foot: Secondary | ICD-10-CM | POA: Diagnosis not present

## 2023-07-28 DIAGNOSIS — G629 Polyneuropathy, unspecified: Secondary | ICD-10-CM | POA: Diagnosis not present

## 2023-07-28 DIAGNOSIS — R197 Diarrhea, unspecified: Secondary | ICD-10-CM | POA: Diagnosis not present

## 2023-07-28 DIAGNOSIS — Z95828 Presence of other vascular implants and grafts: Secondary | ICD-10-CM

## 2023-07-28 LAB — CBC WITH DIFFERENTIAL (CANCER CENTER ONLY)
Abs Immature Granulocytes: 0.07 10*3/uL (ref 0.00–0.07)
Basophils Absolute: 0.1 10*3/uL (ref 0.0–0.1)
Basophils Relative: 1 %
Eosinophils Absolute: 0.7 10*3/uL — ABNORMAL HIGH (ref 0.0–0.5)
Eosinophils Relative: 9 %
HCT: 40.6 % (ref 36.0–46.0)
Hemoglobin: 13.3 g/dL (ref 12.0–15.0)
Immature Granulocytes: 1 %
Lymphocytes Relative: 34 %
Lymphs Abs: 2.5 10*3/uL (ref 0.7–4.0)
MCH: 30.2 pg (ref 26.0–34.0)
MCHC: 32.8 g/dL (ref 30.0–36.0)
MCV: 92.3 fL (ref 80.0–100.0)
Monocytes Absolute: 0.6 10*3/uL (ref 0.1–1.0)
Monocytes Relative: 8 %
Neutro Abs: 3.4 10*3/uL (ref 1.7–7.7)
Neutrophils Relative %: 47 %
Platelet Count: 157 10*3/uL (ref 150–400)
RBC: 4.4 MIL/uL (ref 3.87–5.11)
RDW: 15.3 % (ref 11.5–15.5)
WBC Count: 7.2 10*3/uL (ref 4.0–10.5)
nRBC: 0 % (ref 0.0–0.2)

## 2023-07-28 LAB — CMP (CANCER CENTER ONLY)
ALT: 6 U/L (ref 0–44)
AST: 11 U/L — ABNORMAL LOW (ref 15–41)
Albumin: 3.4 g/dL — ABNORMAL LOW (ref 3.5–5.0)
Alkaline Phosphatase: 76 U/L (ref 38–126)
Anion gap: 5 (ref 5–15)
BUN: 13 mg/dL (ref 8–23)
CO2: 24 mmol/L (ref 22–32)
Calcium: 8.3 mg/dL — ABNORMAL LOW (ref 8.9–10.3)
Chloride: 113 mmol/L — ABNORMAL HIGH (ref 98–111)
Creatinine: 0.93 mg/dL (ref 0.44–1.00)
GFR, Estimated: >60 mL/min (ref >60)
Glucose, Bld: 87 mg/dL (ref 70–99)
Potassium: 4.2 mmol/L (ref 3.5–5.1)
Sodium: 142 mmol/L (ref 135–145)
Total Bilirubin: 0.4 mg/dL (ref 0.0–1.2)
Total Protein: 5.9 g/dL — ABNORMAL LOW (ref 6.5–8.1)

## 2023-07-28 LAB — TSH: TSH: 0.664 u[IU]/mL (ref 0.350–4.500)

## 2023-07-28 MED ORDER — SODIUM CHLORIDE 0.9% FLUSH
10.0000 mL | Freq: Once | INTRAVENOUS | Status: AC
  Administered 2023-07-28: 10 mL

## 2023-07-28 MED ORDER — ONDANSETRON HCL 4 MG/2ML IJ SOLN
8.0000 mg | Freq: Once | INTRAMUSCULAR | Status: AC
  Administered 2023-07-28: 8 mg via INTRAVENOUS
  Filled 2023-07-28: qty 4

## 2023-07-28 MED ORDER — SODIUM CHLORIDE 0.9 % IV SOLN
Freq: Once | INTRAVENOUS | Status: AC
Start: 2023-07-28 — End: 2023-07-28

## 2023-07-28 MED ORDER — HYDROCODONE-ACETAMINOPHEN 10-325 MG PO TABS: 1.0000 | ORAL_TABLET | ORAL | 0 refills | Status: DC | PRN

## 2023-07-28 MED ORDER — SODIUM CHLORIDE 0.9% FLUSH: 10.0000 mL | INTRAVENOUS | Status: DC | PRN

## 2023-07-28 MED ORDER — SODIUM CHLORIDE 0.9 % IV SOLN
1200.0000 mg | Freq: Once | INTRAVENOUS | Status: AC
  Administered 2023-07-28: 1200 mg via INTRAVENOUS
  Filled 2023-07-28: qty 20

## 2023-07-28 NOTE — Progress Notes (Signed)
 [facility] [phone]   [phone]  PROGRESS NOTE  Patient Care Team: Allwardt, Mardy HERO, PA-C as PCP - General (Physician Assistant) O'Neal, Darryle Ned, MD as PCP - Cardiology (Cardiology) [name], MD (Inactive) as Attending Physician (Gastroenterology) [name], MD as Consulting Physician (Orthopedic Surgery) [name], MD as Consulting Physician (Orthopedic Surgery) [name], MD (Inactive) as Consulting Physician (Neurology) [name], MD as Consulting Physician (Neurology) [name], [facility] (Inactive) (Pharmacist) [facility], [facility]   Hematological/Oncological History # Small Cell Lung Cancer, Extensive Stage 07/05/2020: CT abdomen for lower abdominal pain. New pulmonary nodular density noted 07/06/2020: CT chest showed 2.2 cm macrolobulated right lower lobe pulmonary nodule (favored) versus pathologically enlarged infrahilar lymph node 10/13/2020: PET CT scan performed, findings show 2 cm right lower lobe lung mass is hypermetabolic and consistent with primary lung neoplasm. Additionally found hypermetabolic 17 mm soft tissue lesion between the descending duodenum and the pancreatic head  10/19/2020: EGD to biopsy hypermetabolic lymph node. Biopsy results show small cell lung cancer 10/26/2020: establish care with Dr. Federico  11/13/2020: Cycle 1 Day 1 of Carbo/Etop/Atezolizumab  12/04/2020: Cycle 2 Day 1 of Carbo/Etop/Atezolizumab  11/22-11/25/2022: admitted for E. Coli bacteremia/sepsis. Start of Cycle 3 delayed. 01/02/2021: Cycle 3 Day 1 of Carbo/Etop/Atezolizumab  01/23/2021: Cycle 4 Day 1 of Carbo/Etop/Atezolizumab  02/22/2021: Cycle 5 Day 1 of Atezolizumab  Maintenance. Delayed start due to patient's COVID infection.  03/14/2021: Cycle 6 Day 1 of Atezolizumab  Maintenance 04/05/2021: Cycle 7 Day 1 of Atezolizumab  Maintenance 04/25/2021: Cycle 8 Day 1 of Atezolizumab   Maintenance 05/16/2021: Cycle 9 Day 1 of Atezolizumab  Maintenance 06/06/2021: Cycle 10 Day 1 of Atezolizumab  Maintenance 06/27/2021:  Cycle 11 Day 1 of Atezolizumab  Maintenance 07/18/2021: Cycle 12 Day 1 of Atezolizumab  Maintenance 08/08/2021: Cycle 13 Day 1 of Atezolizumab  Maintenance 08/29/2021: Cycle 14 Day 1 of Atezolizumab  Maintenance 09/19/2021: Cycle 15 Day 1 of Atezolizumab  Maintenance 10/10/2021: Cycle 16 Day 1 of Atezolizumab  Maintenance 10/31/2021: Cycle 17 Day 1 of Atezolizumab  Maintenance 11/21/2021: Cycle 18 Day 1 of Atezolizumab  Maintenance 12/12/2021: Cycle 19 Day 1 of Atezolizumab  Maintenance 01/02/2022: Cycle 20 Day 1 of Atezolizumab  Maintenance 01/23/2022: Cycle 21 Day 1 of Atezolizumab  Maintenance 02/13/2022: treatment HELD due to diarrhea.  03/06/2022: Cycle 22 Day 1 of Atezolizumab  Maintenance 03/27/2022: Cycle 23 Day 1 of Atezolizumab  Maintenance 04/17/2022: Cycle 24 Day 1 of Atezolizumab  Maintenance 05/08/2022:  Cycle 25 Day 1 of Atezolizumab  Maintenance (Held due to patient preference for treatment holiday) 05/29/2022: Cycle 25 Day 1 of Atezolizumab  Maintenance  06/19/2022: Cycle 26 Day 1 of Atezolizumab  Maintenance  07/12/2022: Cycle 27 Day 1 of Atezolizumab  Maintenance 07/31/2022: Cycle 28 Day 1 of Atezolizumab  Maintenance HELD per patient request for fatigue.  08/22/2022: Cycle 28 Day 1 of Atezolizumab  Maintenance 09/11/2022: Cycle 29 Day 1 of Atezolizumab  Maintenance 10/03/2022: Cycle 30 Day 1 of Atezolizumab  Maintenance 10/23/2022: Cycle 31 Day 1 of Atezolizumab  Maintenance 11/13/2022: Cycle 32 Day 1 of Atezolizumab  Maintenance 12/24/2022: Cycle 33 Day 1 of Atezolizumab  Maintenance 01/15/2023: Cycle 34 Day 1 of Atezolizumab  Maintenance 02/05/2023: Cycle 35 Day 1 of Atezolizumab  Maintenance 02/26/2023: Cycle 36 Day 1 of Atezolizumab  Maintenance 04/28/2023: Cycle 37 Day 1 of Atezolizumab  Maintenance 04/30/2023: Cycle 38 Day 1 of Atezolizumab  Maintenance 05/21/2023: Cycle 39 Day 1 of  Atezolizumab  Maintenance 06/11/2023: Cycle 40 Day 1 of Atezolizumab  Maintenance 07/28/2023: Cycle 41 Day 1 of Atezolizumab  Maintenance   Interval History:  [name] 68 y.o. female with medical history significant for extensive stage small  cell lung cancer who presents for a follow up visit. The patient's last visit was on 06/11/2023.  She presents today to start cycle 41 of maintenance atezolizumab . She is unaccompanied for this visit.   On exam today [name] reports she is tolerating treatment without any new or concerning symptoms.  She shares that she has been stressed lately due to her brother undergoing workup for a colon mass that was recently found on imaging. She is assisting with bringing him to appointments.   She continues to have lower extremity edema which improves with elevation and compression stockings. Due to the summer heat, she is not consistent with wearing compression stockings. Her nausea is stable without vomiting that improves with zofran . Her bowel habits are overall stable with intermittent episodes of diarrhea. She denies easy bruising or signs of bleeding. She otherwise denies any fevers, chills, sweats, chest pain or cough. A full 10 point ROS is otherwise negative.  MEDICAL HISTORY:  Past Medical History:  Diagnosis Date   Allergic rhinitis    Anemia    Anxiety    Chicken pox    Chronic back pain    COPD (chronic obstructive pulmonary disease) (HCC)    Depression    DM (diabetes mellitus) (HCC)    Essential hypertension    GERD (gastroesophageal reflux disease)    Headache    migraines   History of gastritis    EGD 2015   History of home oxygen  therapy    2 liters at hs last 6 months   Hyperlipidemia    Hypothyroidism    Migraines    Osteoarthritis    oa   Scoliosis    Small cell lung cancer (HCC)    Stage 4    SURGICAL HISTORY: Past Surgical History:  Procedure Laterality Date   APPENDECTOMY     1985   BIOPSY  07/24/2017    Procedure: BIOPSY;  Surgeon: [name], MD;  Location: [facility];  Service: Endoscopy;;   CARDIAC CATHETERIZATION N/A 10/31/2015   Procedure: Left Heart Cath and Coronary Angiography;  Surgeon: Alm LELON Clay, MD;  Location: [facility] IN[facility]ASIVE CV LAB;  Service: Cardiovascular;  Laterality: N/A;   CARPAL TUNNEL RELEASE Left    CARPAL TUNNEL RELEASE Right    CATARACT EXTRACTION W/PHACO Left 02/07/2023   Procedure: CATARACT EXTRACTION PHACO AND INTRAOCULAR LENS PLACEMENT (IOC);  Surgeon: Juli Blunt, MD;  Location: [facility];  Service: Ophthalmology;  Laterality: Left;  CDE: 3.51   CATARACT EXTRACTION W/PHACO Right 02/21/2023   Procedure: CATARACT EXTRACTION PHACO AND INTRAOCULAR LENS PLACEMENT (IOC);  Surgeon: Juli Blunt, MD;  Location: [facility];  Service: Ophthalmology;  Laterality: Right;  CDE: 2.94   CHOLECYSTECTOMY  late 1980's   COLONOSCOPY WITH PROPOFOL  N/A 07/24/2017   Procedure: COLONOSCOPY WITH PROPOFOL ;  Surgeon: [name], MD;  Location: [facility];  Service: Endoscopy;  Laterality: N/A;   ESOPHAGOGASTRODUODENOSCOPY N/A 07/24/2017   Procedure: ESOPHAGOGASTRODUODENOSCOPY (EGD);  Surgeon: [name], MD;  Location: [facility];  Service: Endoscopy;  Laterality: N/A;   ESOPHAGOGASTRODUODENOSCOPY (EGD) WITH PROPOFOL  N/A 10/19/2020   Procedure: ESOPHAGOGASTRODUODENOSCOPY (EGD) WITH PROPOFOL ;  Surgeon: [name], MD;  Location: [facility];  Service: Endoscopy;  Laterality: N/A;   FINE NEEDLE ASPIRATION N/A 10/19/2020   Procedure: FINE NEEDLE ASPIRATION (FNA) LINEAR;  Surgeon: [name], MD;  Location: [facility];  Service: Endoscopy;  Laterality: N/A;   GALLBLADDER SURGERY  1991   HIP CLOSED REDUCTION Right 01/08/2016   Procedure: CLOSED MANIPULATION  HIP;  Surgeon: Reyes Billing, MD;  Location: [facility];  Service: Orthopedics;  Laterality: Right;   HIP CLOSED REDUCTION Right 01/19/2016   Procedure: ATTEMPTED CLOSED REDUCTION RIGHT HIP;  Surgeon: Norleen Armor, MD;  Location: [facility];  Service: Orthopedics;  Laterality: Right;   HIP CLOSED REDUCTION Right 01/20/2016   Procedure: CLOSED REDUCTION RIGHT TOTAL HIP;  Surgeon: Donnice Car, MD;  Location: [facility];  Service: Orthopedics;  Laterality: Right;   HIP CLOSED REDUCTION Right 02/17/2016   Procedure: CLOSED REDUCTION RIGHT TOTAL HIP;  Surgeon: Redell Shoals, MD;  Location: [facility];  Service: Orthopedics;  Laterality: Right;   HIP CLOSED REDUCTION Right 02/28/2016   Procedure: CLOSED REDUCTION HIP;  Surgeon: Selinda Belvie Gosling, MD;  Location: [facility];  Service: Orthopedics;  Laterality: Right;   IR IMAGING GUIDED PORT INSERTION  11/01/2020   POLYPECTOMY  07/24/2017   Procedure: POLYPECTOMY;  Surgeon: [name], MD;  Location: [facility];  Service: Endoscopy;;   TONSILLECTOMY     TOTAL ABDOMINAL HYSTERECTOMY     1985, with 1 ovary removed and 2 nd ovary removed 2003   TOTAL HIP ARTHROPLASTY Right    Original surgery 2006 with revision 2010   TOTAL HIP REVISION Right 01/01/2016   Procedure: TOTAL HIP REVISION;  Surgeon: Donnice Car, MD;  Location: [facility];  Service: Orthopedics;  Laterality: Right;   TOTAL HIP REVISION Right 03/02/2016   Procedure: TOTAL HIP REVISION;  Surgeon: Car Donnice, MD;  Location: [facility];  Service: Orthopedics;  Laterality: Right;   TOTAL HIP REVISION Right 09/02/2016   Procedure: Right hip constrained liner- posterior;  Surgeon: Car Donnice, MD;  Location: [facility];  Service: Orthopedics;  Laterality: Right;   ULNAR NERVE TRANSPOSITION Right    UPPER ESOPHAGEAL ENDOSCOPIC ULTRASOUND (EUS) N/A 10/19/2020   Procedure: UPPER ESOPHAGEAL ENDOSCOPIC ULTRASOUND (EUS);  Surgeon: [name], MD;  Location: [facility];  Service: Endoscopy;  Laterality: N/A;  periduodenal lesion    SOCIAL HISTORY: Social History   Socioeconomic History   Marital status: Married    Spouse name: Not on file   Number of children: 2   Years of education: Not on file   Highest  education level: 12th grade  Occupational History   Occupation: disabled   Occupation: disabled  Tobacco Use   Smoking status: Every Day    Current packs/day: 2.00    Average packs/day: 2.0 packs/day for 46.0 years (92.0 ttl pk-yrs)    Types: Cigarettes   Smokeless tobacco: Never   Tobacco comments:    2 packs of cigarettes smoked daily 12/13/21- declines smoking cessation  Vaping Use   Vaping status: Never Used  Substance and Sexual Activity   Alcohol use: No   Drug use: No   Sexual activity: Not Currently    Partners: Male  Other Topics Concern   Not on file  Social History Narrative   Right handed    Caffeine~ 2 cups per day    Lives at home with husband (strained relationship)   Primary caretaker for disabled brother who had aneurism   Daughter died 06/17/18   Social Drivers of Corporate investment banker Strain: Low Risk  (05/20/2023)   Overall Financial Resource Strain (CARDIA)    Difficulty of Paying Living Expenses: Not very hard  Food Insecurity: Food Insecurity Present (05/20/2023)   Hunger Vital Sign    Worried About Running Out of Food in the Last Year: Sometimes true    Ran Out  of Food in the Last Year: Sometimes true  Transportation Needs: No Transportation Needs (05/20/2023)   PRAPARE - Administrator, Civil Service (Medical): No    Lack of Transportation (Non-Medical): No  Physical Activity: Inactive (05/20/2023)   Exercise Vital Sign    Days of Exercise per Week: 0 days    Minutes of Exercise per Session: 0 min  Stress: No Stress Concern Present (05/20/2023)   [employer] of Occupational Health - Occupational Stress Questionnaire    Feeling of Stress : Only a little  Recent Concern: Stress - Stress Concern Present (03/31/2023)   [employer] of Occupational Health - Occupational Stress Questionnaire    Feeling of Stress : Very much  Social Connections: Moderately Integrated (05/20/2023)   Social Connection and Isolation Panel     Frequency of Communication with Friends and Family: More than three times a week    Frequency of Social Gatherings with Friends and Family: More than three times a week    Attends Religious Services: 1 to 4 times per year    Active Member of [employer] or Organizations: No    Attends Banker Meetings: Never    Marital Status: Married  Catering manager Violence: Not At Risk (05/20/2023)   Humiliation, Afraid, Rape, and Kick questionnaire    Fear of Current or Ex-Partner: No    Emotionally Abused: No    Physically Abused: No    Sexually Abused: No    FAMILY HISTORY: Family History  Problem Relation Age of Onset   COPD Mother    Heart disease Mother    Lung disease Father        Asbestosis   Heart attack Father    Heart disease Father    Cerebral aneurysm Brother    Aneurysm Brother        Brain   Drug abuse Daughter    Epilepsy Son    Alcohol abuse Son    Drug abuse Son    Arthritis Maternal Grandmother    Heart disease Maternal Grandmother    Asthma Maternal Grandfather    Cancer Maternal Grandfather    Arthritis Paternal Grandmother    Heart disease Paternal Grandmother    Stroke Paternal Grandmother    Early death Paternal Grandfather    Heart disease Paternal Grandfather     ALLERGIES:  is allergic to nisoldipine, metformin  and related, nsaids, pregabalin, wellbutrin  [bupropion ], aleve [naproxen sodium], penicillins, and sulfonamide derivatives.  MEDICATIONS:  Current Outpatient Medications  Medication Sig Dispense Refill   albuterol  (PROAIR  HFA) 108 (90 Base) MCG/ACT inhaler 2 puffs every 4 hours as needed only  if your can't catch your breath 18 g 5   ALPRAZolam  (XANAX ) 1 MG tablet TAKE 1 TABLET BY MOUTH THREE TIMES DAILY AS NEEDED FOR ANXIETY 90 tablet 2   ARIPiprazole  (ABILIFY ) 5 MG tablet Take 1 tablet (5 mg total) by mouth daily. 30 tablet 5   atorvastatin  (LIPITOR) 20 MG tablet TAKE 1 TABLET BY MOUTH AT BEDTIME 90 tablet 1   benzonatate  (TESSALON ) 100  MG capsule TAKE ONE CAPSULE BY MOUTH THREE TIMES DAILY 90 capsule 1   calcipotriene  (DOVONOX) 0.005 % cream Apply to affected area bid when on break from steroid cream 120 g 3   cyclobenzaprine  (FLEXERIL ) 10 MG tablet Take 10 mg by mouth 3 (three) times daily as needed.     dicyclomine  (BENTYL ) 10 MG capsule TAKE ONE CAPSULE BY MOUTH EVERY 8 HOURS AS NEEDED FOR SPASMS. please keep your  MAY APPOINTMENT FOR further refills 60 capsule 1   diphenoxylate -atropine  (LOMOTIL ) 2.5-0.025 MG tablet TAKE 1 TABLET BY MOUTH FOUR TIMES DAILY AS NEEDED FOR diarrhea OR loose stools 60 tablet 0   DULoxetine  (CYMBALTA ) 60 MG capsule TAKE TWO CAPSULES BY MOUTH EVERY DAY 180 capsule 1   FEROSUL 325 (65 Fe) MG tablet TAKE 1 TABLET BY MOUTH DAILY WITH BREAKFAST 30 tablet 3   fluconazole  (DIFLUCAN ) 150 MG tablet Take 150 mg by mouth once.     fluticasone  (FLONASE ) 50 MCG/ACT nasal spray INSTILL 2 SPRAYS IN EACH NOSTRIL EVERY DAY 16 g 6   furosemide  (LASIX ) 20 MG tablet TAKE 1 TABLET BY MOUTH TWICE DAILY, IF SWELLING IMPROVES CAN HOLD MEDICATION 90 tablet 0   HYDROcodone -acetaminophen  (NORCO) 10-325 MG tablet Take 1 tablet by mouth every 4 (four) hours as needed. 180 tablet 0   hydrOXYzine  (ATARAX ) 10 MG tablet TAKE 1 TABLET BY MOUTH THREE TIMES DAILY AS NEEDED FOR ITCHING 30 tablet 2   levothyroxine  (SYNTHROID ) 137 MCG tablet Take 1 tablet (137 mcg total) by mouth daily before breakfast. 90 tablet 3   loratadine  (CLARITIN ) 10 MG tablet Take 10 mg by mouth daily.     magic mouthwash (nystatin , diphenhydrAMINE , alum & mag hydroxide) suspension mixture Swish and spit 5 mLs 3 (three) times daily as needed. 180 mL 0   meclizine  (ANTIVERT ) 12.5 MG tablet TAKE 1 TABLET BY MOUTH THREE TIMES DAILY AS NEEDED FOR DIZZINESS 30 tablet 1   meloxicam  (MOBIC ) 15 MG tablet TAKE 1 TABLET BY MOUTH EVERY DAY 30 tablet 2   OLANZapine  (ZYPREXA ) 10 MG tablet Take 1 tablet (10 mg total) by mouth at bedtime. 30 tablet 2   omeprazole  (PRILOSEC) 40  MG capsule TAKE ONE CAPSULE BY MOUTH TWICE DAILY 180 capsule 1   ondansetron  (ZOFRAN ) 8 MG tablet TAKE 1 TABLET BY MOUTH TWICE DAILY 60 tablet 1   permethrin  (ELIMITE ) 5 % cream      potassium chloride  SA (KLOR-CON  M) 20 MEQ tablet TAKE 1 TABLET BY MOUTH TWICE DAILY 60 tablet 2   prochlorperazine  (COMPAZINE ) 10 MG tablet Take 1 tablet (10 mg total) by mouth every 6 (six) hours as needed for nausea or vomiting. 30 tablet 0   Rimegepant Sulfate (NURTEC) 75 MG TBDP TAKE 1 TABLET BY MOUTH DAILY AS NEEDED FOR MIGRAINE, take WITHIN 15 MINUTES of initial symptoms 8 tablet 1   topiramate  (TOPAMAX ) 100 MG tablet TAKE 1 TABLET BY MOUTH AT BEDTIME 90 tablet 1   triamcinolone  ointment (KENALOG ) 0.5 % Apply 1 Application topically 3 (three) times daily. Apply thin-layer on affected areas, no more than 2 weeks consistently. 60 g 2   triamterene -hydrochlorothiazide  (MAXZIDE -25) 37.5-25 MG tablet Take 1 tablet by mouth daily.     umeclidinium-vilanterol (ANORO ELLIPTA ) 62.5-25 MCG/ACT AEPB Inhale 1 puff into the lungs daily. 60 each 11   No current facility-administered medications for this visit.    REVIEW OF SYSTEMS:   Constitutional: ( - ) fevers, ( - )  chills , ( - ) night sweats Eyes: ( - ) blurriness of vision, ( - ) double vision, ( - ) watery eyes Ears, nose, mouth, throat, and face: ( - ) mucositis, ( - ) sore throat Respiratory: ( - ) cough, ( -) dyspnea, ( - ) wheezes Cardiovascular: ( - ) palpitation, ( - ) chest discomfort, ( - ) lower extremity swelling Gastrointestinal:  ( +) nausea, ( - ) heartburn, ( - ) change in bowel habits Skin: ( - )  abnormal skin rashes Lymphatics: ( - ) new lymphadenopathy, ( - ) easy bruising Neurological: (+ ) numbness, ( - ) tingling, ( - ) new weaknesses Behavioral/Psych: ( - ) mood change, ( - ) new changes  All other systems were reviewed with the patient and are negative.  PHYSICAL EXAMINATION: ECOG PERFORMANCE STATUS: 1 - Symptomatic but completely  ambulatory  Vitals:   07/28/23 1254  BP: (!) 163/89  Pulse: 86  Resp: 20  Temp: (!) 97 F (36.1 C)  SpO2: 100%   Filed Weights   07/28/23 1254  Weight: 185 lb 1.6 oz (84 kg)    GENERAL: Well-appearing middle-age Caucasian female, alert, no distress and comfortable SKIN: skin color, texture, turgor are normal, no rashes or significant lesions EYES: conjunctiva are pink and non-injected, sclera clear LUNGS:  normal breathing effort. Diffuse wheezing hear on ausculation.  HEART: regular rate & rhythm and no murmurs. Mild bilateral lower extremity edema Musculoskeletal: no cyanosis of digits and no clubbing  PSYCH: alert & oriented x 3, fluent speech NEURO: no focal motor/sensory deficits  LABORATORY DATA:  I have reviewed the data as listed    Latest Ref Rng & Units 06/11/2023   10:57 AM 05/21/2023    9:47 AM 04/30/2023   10:22 AM  CBC  WBC 4.0 - 10.5 K/uL 8.8  7.1  7.2   Hemoglobin 12.0 - 15.0 g/dL 85.9  86.5  85.9   Hematocrit 36.0 - 46.0 % 41.8  40.1  41.5   Platelets 150 - 400 K/uL 173  169  174        Latest Ref Rng & Units 06/11/2023   10:57 AM 05/21/2023    9:47 AM 04/30/2023   10:22 AM  CMP  Glucose 70 - 99 mg/dL 88  899  896   BUN 8 - 23 mg/dL 14  12  9    Creatinine 0.44 - 1.00 mg/dL 8.88  8.98  8.91   Sodium 135 - 145 mmol/L 139  136  132   Potassium 3.5 - 5.1 mmol/L 3.6  3.7  3.4   Chloride 98 - 111 mmol/L 105  103  99   CO2 22 - 32 mmol/L 26  28  27    Calcium  8.9 - 10.3 mg/dL 8.4  8.4  8.6   Total Protein 6.5 - 8.1 g/dL 6.2  6.0  6.1   Total Bilirubin 0.0 - 1.2 mg/dL 0.4  0.4  0.5   Alkaline Phos 38 - 126 U/L 99  76  67   AST 15 - 41 U/L 18  25  15    ALT 0 - 44 U/L 14  23  9      No results found for: MPROTEIN Lab Results  Component Value Date   KPAFRELGTCHN 0.75 06/30/2014   LAMBDASER 3.78 (H) 06/30/2014   KAPLAMBRATIO 0.20 (L) 06/30/2014     RADIOGRAPHIC STUDIES: CT CHEST ABDOMEN PELVIS W CONTRAST Result Date: 07/07/2023 EXAMINATION: CT CHEST  ABDOMEN PELVIS W CONTRAST CLINICAL INDICATION: Female, 68 years old. evaluate for treatment response, small cell lung cancer. TECHNIQUE: Axial CT of the chest, abdomen, and pelvis with 100 cc Omnipaque  300 intravenous contrast. Multiplanar reformations provided. Unless otherwise specified, incidental thyroid , adrenal, renal lesions do not require dedicated imaging follow up. Additionally, any mentioned pulmonary nodules do not require dedicated imaging follow-up based on the Fleischner guidelines unless otherwise specified. Coronary calcifications are not identified unless otherwise specified. COMPARISON: 04/17/2023 FINDINGS: CHEST: Right chest wall Mediport catheter tip terminates in the right  atrium. The visualized thyroid  is normal. The thoracic aorta is normal in caliber. Scattered atherosclerotic changes are present. The main pulmonary artery is normal in caliber. The heart is normal in size. There are coronary calcifications. There is no free fluid or pathologic lymphadenopathy by size criteria. The trachea and mainstem bronchi are patent. There is mild centrilobular/paraseptal pulmonary emphysema. ABDOMEN/PELVIS: The liver is normal. Gallbladder surgically absent. The spleen is normal. Pancreas is normal. The right adrenal is normal. Stable left adrenal adenoma noted. Small right renal cyst is seen. There is mild left renal cortical scarring. The abdominal aorta is normal in caliber. Scattered atherosclerotic changes are present. The urinary bladder is normal. The uterus is surgically absent. There is colonic diverticulosis. Large and small bowel loops are otherwise within normal limits. There is no free fluid or lymphadenopathy. BONES: Right hip arthroplasty is seen. There are degenerative changes of the spine and bony pelvis IMPRESSION: No convincing evidence for malignancy within the chest, abdomen, or pelvis. DOSE REDUCTION: All CT scans are performed using radiation dose reduction techniques, when  applicable. Technical factors are evaluated and adjusted to ensure appropriate moderation of exposure. Electronically signed by: [place] Engel MD 07/07/2023 06:35 PM EDT RP Workstation: [id number]     ASSESSMENT & PLAN Verlie T [name] 68 y.o. female with medical history significant for extensive stage small cell lung cancer who presents for a follow up visit.   After review of the labs, review of the records, and discussion with the patient the patients findings are most consistent with extensive stage small cell lung cancer with metastasis from the right lower lobe to the lymph nodes of the abdomen.  At this time we [name] pursue triple therapy with carboplatin , etoposide , and atezolizumab .  After 4 cycles we [name] convert to maintenance atezolizumab  alone.  We previously discussed the risks and benefits of this therapy and the patient was in agreement to proceed with this treatment.  The treatment of choice consist of carboplatin , etoposide , and atezolizumab .  The regimen consists of carboplatin  AUC of 5 IV on day 1, etoposide  100 mg per metered squared IV on day 1, 2, and 3 and atezolizumab  1200 mg on day 1.  This continues for 21-day cycles.  After 4 cycles the patient proceeds with atezolizumab  maintenance therapy alone.    # Small Cell Lung Cancer, Extensive Stage -- MRI of the brain shows no evidence of intracranial spread --Findings are currently consistent with metastatic small cell lung cancer with metastatic spread to the lymph nodes of the abdomen -- Started carboplatin , etoposide , and atezolizumab  on 11/13/2020. Transitioned to maintenance Atezolizumab  on 02/22/2021.  Plan: --today is Cycle 41 Day 1 of Atezolizumab  maintenance.  --labs today were reviewed and adequate for treatment. WBC 7.2, Hgb 13.3, Plt 157, creatinine and LFTs in range -- Most recent CT CAP from 07/07/2023 which shows no evidence of recurrence. Repeat scan due in  Sept 2025.   --Proceed with treatment without any dose  modifications.  --RTC in 3 weeks for a follow up before Cycle 42  # Early Cirrhosis --noted on MR abdomen from 05/22/2022. US  with elastography performed on 06/18/2022. --continue to follow with GI.   #Right renal lesion: --Noted to be benign on most recent MRI of the abdomen.  #Diarrhea-improved --stool sent for C. Diff and GI pathology panel, all negative --patient following with GI and found to have pancreatic insufficiency and started on creon  with improvement of symptoms.   #Nausea: --Symptoms improved with zofran  and olanzapine .  --Added IV  zofran  to infusion premeds for better symptom control.   #Hypokalemia:  --currently taking potassium chloride  20 meq BID.   --potassium level is 4.2 today. Continue PO supplementation.   #Lower extremity edema --Currently takes lasix  20 mg 1-2 times a day. --Improved with compression stockings.   #Neuropathy involving fingers/feet: --Patient currently takes Cymbalta    #Supportive Care -- chemotherapy education complete -- port placed -- zofran  8mg  q8H PRN and compazine  10mg  PO q6H for nausea -- EMLA  cream for port  No orders of the defined types were placed in this encounter.  All questions were answered. The patient knows to call the clinic with any problems, questions or concerns.  I have spent a total of 30 minutes minutes of face-to-face and non-face-to-face time, preparing to see the patient, performing a medically appropriate examination, counseling and educating the patient, ordering medications/tests, documenting clinical information in the electronic health record, and care coordination.   Johnston Police PA-C Dept of Hematology and Oncology [facility] Phone: [phone]  07/28/2023 1:07 PM

## 2023-07-28 NOTE — Patient Instructions (Addendum)
 [facility] - A DEPT OF [facility]  Discharge Instructions: Thank you for choosing [facility] to provide your oncology and hematology care.   If you have a lab appointment with the Cancer Center, please go directly to the Cancer Center and check in at the registration area.   Wear comfortable clothing and clothing appropriate for easy access to any Portacath or PICC line.   We strive to give you quality time with your provider. You may need to reschedule your appointment if you arrive late (15 or more minutes).  Arriving late affects you and other patients whose appointments are after yours.  Also, if you miss three or more appointments without notifying the office, you may be dismissed from the clinic at the provider's discretion.      For prescription refill requests, have your pharmacy contact our office and allow 72 hours for refills to be completed.    Today you received the following chemotherapy and/or immunotherapy agent: Atezolizumab  (Tecentriq )   To help prevent nausea and vomiting after your treatment, we encourage you to take your nausea medication as directed.  BELOW ARE SYMPTOMS THAT SHOULD BE REPORTED IMMEDIATELY: *FEVER GREATER THAN 100.4 F (38 C) OR HIGHER *CHILLS OR SWEATING *NAUSEA AND VOMITING THAT IS NOT CONTROLLED WITH YOUR NAUSEA MEDICATION *UNUSUAL SHORTNESS OF BREATH *UNUSUAL BRUISING OR BLEEDING *URINARY PROBLEMS (pain or burning when urinating, or frequent urination) *BOWEL PROBLEMS (unusual diarrhea, constipation, pain near the anus) TENDERNESS IN MOUTH AND THROAT WITH OR WITHOUT PRESENCE OF ULCERS (sore throat, sores in mouth, or a toothache) UNUSUAL RASH, SWELLING OR PAIN  UNUSUAL VAGINAL DISCHARGE OR ITCHING   Items with * indicate a potential emergency and should be followed up as soon as possible or go to the Emergency Department if any problems should occur.  Please show the CHEMOTHERAPY ALERT CARD or  IMMUNOTHERAPY ALERT CARD at check-in to the Emergency Department and triage nurse.  Should you have questions after your visit or need to cancel or reschedule your appointment, please contact [facility] - A DEPT OF [facility]  Dept: [phone]  and follow the prompts.  Office hours are 8:00 a.m. to 4:30 p.m. Monday - Friday. Please note that voicemails left after 4:00 p.m. may not be returned until the following business day.  We are closed weekends and major holidays. You have access to a nurse at all times for urgent questions. Please call the main number to the clinic Dept: [phone] and follow the prompts.   For any non-urgent questions, you may also contact your provider using MyChart. We now offer e-Visits for anyone 11 and older to request care online for non-urgent symptoms. For details visit [email or web address].   Also download the MyChart app! Go to the app store, search MyChart, open the app, select [place], and log in with your MyChart username and password.

## 2023-07-28 NOTE — Telephone Encounter (Signed)
 PMP was Reviewed.  Dr Cornelio note was reviewed.  Hydrocodone  e-scribed to pharmacy.  Call placed to [name] regarding the above, her husband answered the phone. She was'n't home at this time.

## 2023-07-29 ENCOUNTER — Telehealth: Payer: Self-pay | Admitting: Hematology

## 2023-07-29 ENCOUNTER — Other Ambulatory Visit: Payer: Self-pay | Admitting: Physician Assistant

## 2023-07-29 LAB — T4: T4, Total: 9.8 ug/dL (ref 4.5–12.0)

## 2023-07-29 MED ORDER — MECLIZINE HCL 12.5 MG PO TABS: 12.5000 mg | ORAL_TABLET | Freq: Two times a day (BID) | ORAL | 1 refills | Status: DC

## 2023-07-29 NOTE — Telephone Encounter (Signed)
 Scheduled appointments per WQ. Called and left a VM with appointment details for the patient.

## 2023-07-30 DIAGNOSIS — F17210 Nicotine dependence, cigarettes, uncomplicated: Secondary | ICD-10-CM | POA: Diagnosis not present

## 2023-07-30 DIAGNOSIS — I10 Essential (primary) hypertension: Secondary | ICD-10-CM | POA: Diagnosis not present

## 2023-07-30 DIAGNOSIS — Z743 Need for continuous supervision: Secondary | ICD-10-CM | POA: Diagnosis not present

## 2023-07-30 DIAGNOSIS — Z7951 Long term (current) use of inhaled steroids: Secondary | ICD-10-CM | POA: Diagnosis not present

## 2023-07-30 DIAGNOSIS — R0689 Other abnormalities of breathing: Secondary | ICD-10-CM | POA: Diagnosis not present

## 2023-07-30 DIAGNOSIS — R6889 Other general symptoms and signs: Secondary | ICD-10-CM | POA: Diagnosis not present

## 2023-07-30 DIAGNOSIS — K219 Gastro-esophageal reflux disease without esophagitis: Secondary | ICD-10-CM | POA: Diagnosis not present

## 2023-07-30 DIAGNOSIS — C3490 Malignant neoplasm of unspecified part of unspecified bronchus or lung: Secondary | ICD-10-CM | POA: Diagnosis not present

## 2023-07-30 DIAGNOSIS — J449 Chronic obstructive pulmonary disease, unspecified: Secondary | ICD-10-CM | POA: Diagnosis not present

## 2023-07-30 DIAGNOSIS — T400X1A Poisoning by opium, accidental (unintentional), initial encounter: Secondary | ICD-10-CM | POA: Diagnosis not present

## 2023-07-30 DIAGNOSIS — T50901A Poisoning by unspecified drugs, medicaments and biological substances, accidental (unintentional), initial encounter: Secondary | ICD-10-CM | POA: Diagnosis not present

## 2023-07-30 DIAGNOSIS — G894 Chronic pain syndrome: Secondary | ICD-10-CM | POA: Diagnosis not present

## 2023-07-30 DIAGNOSIS — Z79899 Other long term (current) drug therapy: Secondary | ICD-10-CM | POA: Diagnosis not present

## 2023-07-30 DIAGNOSIS — N390 Urinary tract infection, site not specified: Secondary | ICD-10-CM | POA: Diagnosis not present

## 2023-07-30 DIAGNOSIS — R55 Syncope and collapse: Secondary | ICD-10-CM | POA: Diagnosis not present

## 2023-07-30 DIAGNOSIS — T40601A Poisoning by unspecified narcotics, accidental (unintentional), initial encounter: Secondary | ICD-10-CM | POA: Diagnosis not present

## 2023-07-30 DIAGNOSIS — R5383 Other fatigue: Secondary | ICD-10-CM | POA: Diagnosis not present

## 2023-07-30 DIAGNOSIS — G928 Other toxic encephalopathy: Secondary | ICD-10-CM | POA: Diagnosis not present

## 2023-07-30 DIAGNOSIS — E039 Hypothyroidism, unspecified: Secondary | ICD-10-CM | POA: Diagnosis not present

## 2023-07-30 DIAGNOSIS — G4733 Obstructive sleep apnea (adult) (pediatric): Secondary | ICD-10-CM | POA: Diagnosis not present

## 2023-07-30 DIAGNOSIS — E7800 Pure hypercholesterolemia, unspecified: Secondary | ICD-10-CM | POA: Diagnosis not present

## 2023-07-30 DIAGNOSIS — E785 Hyperlipidemia, unspecified: Secondary | ICD-10-CM | POA: Diagnosis not present

## 2023-07-30 DIAGNOSIS — R404 Transient alteration of awareness: Secondary | ICD-10-CM | POA: Diagnosis not present

## 2023-07-30 DIAGNOSIS — Z66 Do not resuscitate: Secondary | ICD-10-CM | POA: Diagnosis not present

## 2023-07-30 DIAGNOSIS — R5382 Chronic fatigue, unspecified: Secondary | ICD-10-CM | POA: Diagnosis not present

## 2023-07-30 DIAGNOSIS — N3001 Acute cystitis with hematuria: Secondary | ICD-10-CM | POA: Diagnosis not present

## 2023-08-04 ENCOUNTER — Other Ambulatory Visit: Payer: Self-pay | Admitting: *Deleted

## 2023-08-04 MED ORDER — MECLIZINE HCL 12.5 MG PO TABS: 12.5000 mg | ORAL_TABLET | Freq: Two times a day (BID) | ORAL | 1 refills | Status: DC

## 2023-08-06 DIAGNOSIS — M19071 Primary osteoarthritis, right ankle and foot: Secondary | ICD-10-CM | POA: Diagnosis not present

## 2023-08-06 DIAGNOSIS — M79671 Pain in right foot: Secondary | ICD-10-CM | POA: Diagnosis not present

## 2023-08-06 DIAGNOSIS — S99821A Other specified injuries of right foot, initial encounter: Secondary | ICD-10-CM | POA: Diagnosis not present

## 2023-08-06 DIAGNOSIS — R296 Repeated falls: Secondary | ICD-10-CM | POA: Diagnosis not present

## 2023-08-07 ENCOUNTER — Ambulatory Visit: Admitting: Physician Assistant

## 2023-08-08 ENCOUNTER — Encounter: Attending: Physical Medicine and Rehabilitation | Admitting: Physical Medicine and Rehabilitation

## 2023-08-08 ENCOUNTER — Encounter: Payer: Self-pay | Admitting: Physical Medicine and Rehabilitation

## 2023-08-08 VITALS — BP 131/89 | HR 98 | Ht 62.0 in | Wt 179.6 lb

## 2023-08-08 DIAGNOSIS — G43119 Migraine with aura, intractable, without status migrainosus: Secondary | ICD-10-CM | POA: Diagnosis not present

## 2023-08-08 DIAGNOSIS — R29898 Other symptoms and signs involving the musculoskeletal system: Secondary | ICD-10-CM | POA: Insufficient documentation

## 2023-08-08 DIAGNOSIS — R269 Unspecified abnormalities of gait and mobility: Secondary | ICD-10-CM | POA: Diagnosis not present

## 2023-08-08 DIAGNOSIS — E038 Other specified hypothyroidism: Secondary | ICD-10-CM | POA: Diagnosis not present

## 2023-08-08 DIAGNOSIS — G894 Chronic pain syndrome: Secondary | ICD-10-CM | POA: Diagnosis not present

## 2023-08-08 MED ORDER — HYDROCODONE-ACETAMINOPHEN 10-325 MG PO TABS: 1.0000 | ORAL_TABLET | ORAL | 0 refills | Status: DC | PRN

## 2023-08-08 MED ORDER — NALOXONE HCL 4 MG/0.1ML NA LIQD: 1.0000 | Freq: Once | NASAL | 3 refills | Status: AC

## 2023-08-08 NOTE — Patient Instructions (Signed)
 Pt is a 68 yr old female with hx of newly dx'd COPD, still smoking, HTN, DM- diet controlled; anxiety and depression;  Scoliosis and deg back issues,  Generalized OA;  chronic pain- here for f/u of chronic pain issues. With new 2.2 cm R lower pulmonary nodule and hemorrhoids.  Now has dx of Small cell lung cancer extensive stage 4 per oncology note. Here for f/u on chronic pain.     If this occurs again, with having opioid overdose, will have to wean off pain meds because wouldn't be safe.    2. If this occurs again or you are concerned have overdosed on meds, have prescribed the Narcan  nasal spray- can use (don't prime the spray) use in 1 nostril- can repeat in 3 minutes if need be- also call 911 if this occurs, BEFORE you go unresponsive   3. In the first 30 minutes, if you vomit, that will help reduce the dose in your system  But ideally would like that done at ER.   4. Con't  Topamax  for headaches- last filled 06/13/23   5. Con't Synthroid -  when I see you next, need to check your levels.    6. Con't Duloxetine - 120 mg daily- for nerve pain and mood. Last filled 06/05/23.    7. Con't Nurtec- last refill 06/30/23- is a miracle pill for her-    8. Will ask Dr Emeline to do EMG of LE's- to see if we can figure out why so weak in her LE's- is a littl ebetter than in April, but still weak-  can get weakness due to chemo meds- however, usually symmetric, and hers shows RLE weaker than LLE.    9. I agree with PT her PCP ordered.    10. F/U in 2 months- to f/u on chronic pain and LE's weakness-

## 2023-08-08 NOTE — Progress Notes (Signed)
 Subjective:    Patient ID: [name], female    DOB: 1955-03-25, 68 y.o.   MRN: [id number]  HPI  Pt is a 68 yr old female with hx of newly dx'd COPD, still smoking, HTN, DM- diet controlled; anxiety and depression;  Scoliosis and deg back issues,  Generalized OA;  chronic pain- here for f/u of chronic pain issues. With new 2.2 cm R lower pulmonary nodule and hemorrhoids.  Now has dx of Small cell lung cancer extensive stage 4 per oncology note. Here for f/u on chronic pain.     Pt was admitted 07/30/23 for  Unresponsiveness and accidental opiate overdose. Also found to have UTI. Was there overnight.  Accidentally took AM meds and night meds.   Feels like just got it mixed up.  Got confused and took AM and PM meds.  Also on Xanax -  pain meds in safe - in Rx bottle and keeps a few in pocket book And Xanax  in pocket book- in separate bottles.  And 2-3 xanax  and 3-4 pain pills in cigar tube-   Doesn't want to die- scared the crap out of her.  So needs to take care of brother.   After this occurred she prayed- husband was asleep-   New method- has a cigar tube - in drawer-   Feels like has a UTI all the time-  Every 3 months or so.      Middle of May- brother was very confused and couldn't talk- has CHF- EF 30%; COPD;  (blood in BM's for 8 months)- no cancer  on partial colonoscopy did yesterday.  On O2 24/7.  Keeps smoking in spite of O2.     Has new PCP- because former PCP would not refill her BP meds.   She denied and asked her to get in touch with office-  called office as asked- and was out of BP meds- never called her or send in BP meds- waited a couple of days- so got new PCP- Zelda Pizza- is NP or PA.  In [employer] office in [place], [place]  Skipped immune modulator-since stuff with her Lavella Ny.  Side effects of chemo are back- cannot eat-     PCP did xray of R big tie- trips over R foot/great toe. Going to start PT for this.    Pain Inventory Average Pain  8 Pain Right Now 8 My pain is constant, sharp, and stabbing  In the last 24 hours, has pain interfered with the following? General activity 10 Relation with others 10 Enjoyment of life 10 What TIME of day is your pain at its worst? morning  and evening Sleep (in general) Poor  Pain is worse with: walking, bending, and some activites Pain improves with: medication Relief from Meds: na  Family History  Problem Relation Age of Onset   COPD Mother    Heart disease Mother    Lung disease Father        Asbestosis   Heart attack Father    Heart disease Father    Cerebral aneurysm Brother    Aneurysm Brother        Brain   Drug abuse Daughter    Epilepsy Son    Alcohol abuse Son    Drug abuse Son    Arthritis Maternal Grandmother    Heart disease Maternal Grandmother    Asthma Maternal Grandfather    Cancer Maternal Grandfather    Arthritis Paternal Grandmother    Heart disease Paternal  Grandmother    Stroke Paternal Grandmother    Early death Paternal Grandfather    Heart disease Paternal Grandfather    Social History   Socioeconomic History   Marital status: Married    Spouse name: Not on file   Number of children: 2   Years of education: Not on file   Highest education level: 12th grade  Occupational History   Occupation: disabled   Occupation: disabled  Tobacco Use   Smoking status: Every Day    Current packs/day: 2.00    Average packs/day: 2.0 packs/day for 46.0 years (92.0 ttl pk-yrs)    Types: Cigarettes   Smokeless tobacco: Never   Tobacco comments:    2 packs of cigarettes smoked daily 12/13/21- declines smoking cessation  Vaping Use   Vaping status: Never Used  Substance and Sexual Activity   Alcohol use: No   Drug use: No   Sexual activity: Not Currently    Partners: Male  Other Topics Concern   Not on file  Social History Narrative   Right handed    Caffeine~ 2 cups per day    Lives at home with husband (strained relationship)   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   Daughter died 07-14-2018   Social Drivers of Corporate investment banker Strain: Low Risk  (07/31/2023)   Received from [facility]   Overall Financial Resource Strain (CARDIA)    How hard is it for you to pay for the very basics like food, housing, medical care, and heating?: Not very hard  Food Insecurity: No Food Insecurity (07/31/2023)   [facility]   Hunger Vital Sign    Within the past 12 months, you worried that your food would run out before you got the money to buy more.: Never true    Within the past 12 months, the food you bought just didn't last and you didn't have money to get more.: Never true  Recent Concern: Food Insecurity - Food Insecurity Present (05/20/2023)   Hunger Vital Sign    Worried About Running Out of Food in the Last Year: Sometimes true    Ran Out of Food in the Last Year: Sometimes true  Transportation Needs: No Transportation Needs (07/31/2023)   Received from [facility]   PRAPARE - Transportation    Lack of Transportation (Medical): No    Lack of Transportation (Non-Medical): No  Physical Activity: Inactive (05/20/2023)   Exercise Vital Sign    Days of Exercise per Week: 0 days    Minutes of Exercise per Session: 0 min  Stress: No Stress Concern Present (05/20/2023)   [employer] of Occupational Health - Occupational Stress Questionnaire    Feeling of Stress : Only a little  Recent Concern: Stress - Stress Concern Present (03/31/2023)   [employer] of Occupational Health - Occupational Stress Questionnaire    Feeling of Stress : Very much  Social Connections: Moderately Integrated (05/20/2023)   Social Connection and Isolation Panel    Frequency of Communication with Friends and Family: More than three times a week    Frequency of Social Gatherings with Friends and Family: More than three times a week    Attends Religious Services: 1 to 4 times per year    Active Member  of [employer] or Organizations: No    Attends Banker Meetings: Never    Marital Status: Married   Past Surgical History:  Procedure Laterality Date  APPENDECTOMY     1985   BIOPSY  07/24/2017   Procedure: BIOPSY;  Surgeon: [name], MD;  Location: [facility];  Service: Endoscopy;;   CARDIAC CATHETERIZATION N/A 10/31/2015   Procedure: Left Heart Cath and Coronary Angiography;  Surgeon: Alm LELON Clay, MD;  Location[facility] INVASIVE CV LAB;  Service: Cardiovascular;  Laterality: N/A;   CARPAL TUNNEL RELEASE Left    CARPAL TUNNEL RELEASE Right    CATARACT EXTRACTION W/PHACO Left 02/07/2023   Procedure: CATARACT EXTRACTION PHACO AND INTRAOCULAR LENS PLACEMENT (IOC);  Surgeon: Juli Blunt, MD;  [facility]: [facility];  Service: Ophthalmology;  Laterality: Left;  CDE: 3.51   CATARACT EXTRACTION W/PHACO Right 02/21/2023   Procedure: CATARACT EXTRACTION PHACO AND INTRAOCULAR LENS PLACEMENT (IOC);  Surgeon: Juli Blunt, MD;  Location: [facility];  Service: Ophthalmology;  Laterality: Right;  CDE: 2.94   CHOLECYSTECTOMY  late 1980's   COLONOSCOPY WITH PROPOFOL  N/A 07/24/2017   Procedure: COLONOSCOPY WITH PROPOFOL ;  Surgeon: [name], MD;  [facility];  Service: Endoscopy;  Laterality: N/A;   ESOPHAGOGASTRODUODENOSCOPY N/A 07/24/2017   Procedure: ESOPHAGOGASTRODUODENOSCOPY (EGD);  Surgeon: [name], MD;  Location: [facility];  Service: Endoscopy;  Laterality: N/A;   ESOPHAGOGASTRODUODENOSCOPY (EGD) WITH PROPOFOL  N/A 10/19/2020   Procedure: ESOPHAGOGASTRODUODENOSCOPY (EGD) WITH PROPOFOL ;  Surgeon: [name], MD;  [facility];  Service: Endoscopy;  Laterality: N/A;   FINE NEEDLE ASPIRATION N/A 10/19/2020   Procedure: FINE NEEDLE ASPIRATION (FNA) LINEAR;  Surgeon: [name], MD;  [facility];  Service: Endoscopy;  Laterality: N/A;   GALLBLADDER SURGERY  1991   HIP CLOSED REDUCTION Right 01/08/2016   Procedure: CLOSED  MANIPULATION HIP;  Surgeon: Reyes Billing, MD;  [facility];  Service: Orthopedics;  Laterality: Right;   HIP CLOSED REDUCTION Right 01/19/2016   Procedure: ATTEMPTED CLOSED REDUCTION RIGHT HIP;  Surgeon: Norleen Armor, MD;  [facility];  Service: Orthopedics;  Laterality: Right;   HIP CLOSED REDUCTION Right 01/20/2016   Procedure: CLOSED REDUCTION RIGHT TOTAL HIP;  Surgeon: Donnice Car, MD;  [facility];  Service: Orthopedics;  Laterality: Right;   HIP CLOSED REDUCTION Right 02/17/2016   Procedure: CLOSED REDUCTION RIGHT TOTAL HIP;  Surgeon: Redell Shoals, MD;  [facility]: [facility];  Service: Orthopedics;  Laterality: Right;   HIP CLOSED REDUCTION Right 02/28/2016   Procedure: CLOSED REDUCTION HIP;  Surgeon: Selinda Belvie Gosling, MD;  [facility];  Service: Orthopedics;  Laterality: Right;   IR IMAGING GUIDED PORT INSERTION  11/01/2020   POLYPECTOMY  07/24/2017   Procedure: POLYPECTOMY;  Surgeon: [name], MD;  [facility];  Service: Endoscopy;;   TONSILLECTOMY     TOTAL ABDOMINAL HYSTERECTOMY     1985, with 1 ovary removed and 2 nd ovary removed 2003   TOTAL HIP ARTHROPLASTY Right    Original surgery 2006 with revision 2010   TOTAL HIP REVISION Right 01/01/2016   Procedure: TOTAL HIP REVISION;  Surgeon: Donnice Car, MD;  [facility];  Service: Orthopedics;  Laterality: Right;   TOTAL HIP REVISION Right 03/02/2016   Procedure: TOTAL HIP REVISION;  Surgeon: Car Donnice, MD;  [facility];  Service: Orthopedics;  Laterality: Right;   TOTAL HIP REVISION Right 09/02/2016   Procedure: Right hip constrained liner- posterior;  Surgeon: Car Donnice, MD;  [facility];  Service: Orthopedics;  Laterality: Right;   ULNAR NERVE TRANSPOSITION Right    UPPER ESOPHAGEAL ENDOSCOPIC ULTRASOUND (EUS) N/A 10/19/2020   Procedure:  UPPER ESOPHAGEAL ENDOSCOPIC ULTRASOUND (EUS);  Surgeon: [name], MD;  Location: [facility];  Service: Endoscopy;  Laterality: N/A;   periduodenal lesion   Past Surgical History:  Procedure Laterality Date   APPENDECTOMY     1985   BIOPSY  07/24/2017   Procedure: BIOPSY;  Surgeon: [name], MD;  [facility];  Service: Endoscopy;;   CARDIAC CATHETERIZATION N/A 10/31/2015   Procedure: Left Heart Cath and Coronary Angiography;  Surgeon: Alm LELON Clay, MD;  Location: [facility] INVASIVE CV LAB;  Service: Cardiovascular;  Laterality: N/A;   CARPAL TUNNEL RELEASE Left    CARPAL TUNNEL RELEASE Right    CATARACT EXTRACTION W/PHACO Left 02/07/2023   Procedure: CATARACT EXTRACTION PHACO AND INTRAOCULAR LENS PLACEMENT (IOC);  Surgeon: Juli Blunt, MD;  [facility];  Service: Ophthalmology;  Laterality: Left;  CDE: 3.51   CATARACT EXTRACTION W/PHACO Right 02/21/2023   Procedure: CATARACT EXTRACTION PHACO AND INTRAOCULAR LENS PLACEMENT (IOC);  Surgeon: Juli Blunt, MD;  [facility]: [facility];  Service: Ophthalmology;  Laterality: Right;  CDE: 2.94   CHOLECYSTECTOMY  late 1980's   COLONOSCOPY WITH PROPOFOL  N/A 07/24/2017   Procedure: COLONOSCOPY WITH PROPOFOL ;  Surgeon: [name], MD;  [facility];  Service: Endoscopy;  Laterality: N/A;   ESOPHAGOGASTRODUODENOSCOPY N/A 07/24/2017   Procedure: ESOPHAGOGASTRODUODENOSCOPY (EGD);  Surgeon: [name], MD;  Location: [facility];  Service: Endoscopy;  Laterality: N/A;   ESOPHAGOGASTRODUODENOSCOPY (EGD) WITH PROPOFOL  N/A 10/19/2020   Procedure: ESOPHAGOGASTRODUODENOSCOPY (EGD) WITH PROPOFOL ;  Surgeon: [name], MD;  [facility];  Service: Endoscopy;  Laterality: N/A;   FINE NEEDLE ASPIRATION N/A 10/19/2020   Procedure: FINE NEEDLE ASPIRATION (FNA) LINEAR;  Surgeon: [name], MD;  [facility];  Service: Endoscopy;  Laterality: N/A;   GALLBLADDER SURGERY  1991   HIP CLOSED REDUCTION Right 01/08/2016   Procedure: CLOSED MANIPULATION HIP;  Surgeon: Reyes Billing, MD;  [facility];  Service: Orthopedics;   Laterality: Right;   HIP CLOSED REDUCTION Right 01/19/2016   Procedure: ATTEMPTED CLOSED REDUCTION RIGHT HIP;  Surgeon: Norleen Armor, MD;  [facility];  Service: Orthopedics;  Laterality: Right;   HIP CLOSED REDUCTION Right 01/20/2016   Procedure: CLOSED REDUCTION RIGHT TOTAL HIP;  Surgeon: Donnice Car, MD;  [facility];  Service: Orthopedics;  Laterality: Right;   HIP CLOSED REDUCTION Right 02/17/2016   Procedure: CLOSED REDUCTION RIGHT TOTAL HIP;  Surgeon: Redell Shoals, MD;  [facility];  Service: Orthopedics;  Laterality: Right;   HIP CLOSED REDUCTION Right 02/28/2016   Procedure: CLOSED REDUCTION HIP;  Surgeon: Selinda Belvie Gosling, MD;  [facility];  Service: Orthopedics;  Laterality: Right;   IR IMAGING GUIDED PORT INSERTION  11/01/2020   POLYPECTOMY  07/24/2017   Procedure: POLYPECTOMY;  Surgeon: [name], MD;  [facility];  Service: Endoscopy;;   TONSILLECTOMY     TOTAL ABDOMINAL HYSTERECTOMY     1985, with 1 ovary removed and 2 nd ovary removed 2003   TOTAL HIP ARTHROPLASTY Right    Original surgery 2006 with revision 2010   TOTAL HIP REVISION Right 01/01/2016   Procedure: TOTAL HIP REVISION;  Surgeon: Donnice Car, MD;  [facility];  Service: Orthopedics;  Laterality: Right;   TOTAL HIP REVISION Right 03/02/2016   Procedure: TOTAL HIP REVISION;  Surgeon: Car Donnice, MD;  [facility];  Service: Orthopedics;  Laterality: Right;   TOTAL HIP REVISION Right 09/02/2016   Procedure: Right hip constrained  liner- posterior;  Surgeon: Ernie Cough, MD;  [facility];  Service: Orthopedics;  Laterality: Right;   ULNAR NERVE TRANSPOSITION Right    UPPER ESOPHAGEAL ENDOSCOPIC ULTRASOUND (EUS) N/A 10/19/2020   Procedure: UPPER ESOPHAGEAL ENDOSCOPIC ULTRASOUND (EUS);  Surgeon: [name], MD;  Location: [facility];  Service: Endoscopy;  Laterality: N/A;  periduodenal lesion   Past Medical History:  Diagnosis Date   Allergic rhinitis     Anemia    Anxiety    Chicken pox    Chronic back pain    COPD (chronic obstructive pulmonary disease) (HCC)    Depression    DM (diabetes mellitus) (HCC)    Essential hypertension    GERD (gastroesophageal reflux disease)    Headache    migraines   History of gastritis    EGD 2015   History of home oxygen  therapy    2 liters at hs last 6 months   Hyperlipidemia    Hypothyroidism    Migraines    Osteoarthritis    oa   Scoliosis    Small cell lung cancer (HCC)    Stage 4   BP 131/89   Pulse 98   Ht 5' 2 (1.575 m)   Wt 179 lb 9.6 oz (81.5 kg)   SpO2 97%   BMI 32.85 kg/m   Opioid Risk Score:   Fall Risk Score:  `1  Depression screen [facility] 2/9     08/08/2023    9:22 AM 05/28/2023   11:52 AM 05/20/2023    1:11 PM 05/08/2023    1:25 PM 01/24/2023    1:54 PM 01/06/2023   12:56 PM 12/13/2022    1:42 PM  Depression screen PHQ 2/9  Decreased Interest 1 1 1  0 3 3 1   Down, Depressed, Hopeless 1 1 1  0 3 3 1   PHQ - 2 Score 2 2 2  0 6 6 2   Altered sleeping   0   1   Tired, decreased energy   1   3   Change in appetite   0   3   Feeling bad or failure about yourself    1   3   Trouble concentrating   0   1   Moving slowly or fidgety/restless   0   0   Suicidal thoughts   0   0   PHQ-9 Score   4   17   Difficult doing work/chores   Not difficult at all   Somewhat difficult      Review of Systems  Musculoskeletal:  Positive for back pain.       Bilateral hands, feet and left knee  All other systems reviewed and are negative.      Objective:   Physical Exam Awake, alert, appropriate, tearful at times, NAD  MSK: RLE- HF 4/5; KE 4/5; KF 4-/5 DF 3+/5 and PF 4-/5 LLE- HF 4+/5; KE/KF 4+/5 DF 4-/5 and PF 4/5  MRI doesn't show a reason for RLE weakness- however it's still weak-   Neuro: No LE DTR's- are absent Decreased to absent to light touch from knees down      Assessment & Plan:   Pt is a 69 yr old female with hx of newly dx'd COPD, still smoking, HTN, DM- diet  controlled; anxiety and depression;  Scoliosis and deg back issues,  Generalized OA;  chronic pain- here for f/u of chronic pain issues. With new 2.2 cm R lower pulmonary nodule and hemorrhoids.  Now has  dx of Small cell lung cancer extensive stage 4 per oncology note. Here for f/u on chronic pain.     If this occurs again, with having opioid overdose, will have to wean off pain meds because wouldn't be safe.    2. If this occurs again or you are concerned have overdosed on meds, have prescribed the Narcan  nasal spray- can use (don't prime the spray) use in 1 nostril- can repeat in 3 minutes if need be- also call 911 if this occurs, BEFORE you go unresponsive   3. In the first 30 minutes, if you vomit, that will help reduce the dose in your system  But ideally would like that done at ER.   4. Con't  Topamax  for headaches- last filled 06/13/23   5. Con't Synthroid -  when I see you next, need to check your levels.    6. Con't Duloxetine - 120 mg daily- for nerve pain and mood. Last filled 06/05/23.    7. Con't Nurtec- last refill 06/30/23- is a miracle pill for her-    8. Will ask Dr Emeline to do EMG of LE's- to see if we can figure out why so weak in her LE's- is a littl ebetter than in April, but still weak-  can get weakness due to chemo meds- however, usually symmetric, and hers shows RLE weaker than LLE.    9. I agree with PT her PCP ordered.    10. F/U in 2 months- to f/u on chronic pain and LE's weakness-    I spent a total of  43  minutes on total care today- >50% coordination of care- due to d/w pt about Narcan  need, and education on overdose- as well as d/w pt about LE weakness and work up needed- also refilled all meds.

## 2023-08-10 ENCOUNTER — Other Ambulatory Visit: Payer: Self-pay | Admitting: Physician Assistant

## 2023-08-10 DIAGNOSIS — E785 Hyperlipidemia, unspecified: Secondary | ICD-10-CM

## 2023-08-12 ENCOUNTER — Encounter: Payer: Self-pay | Admitting: Hematology and Oncology

## 2023-08-13 ENCOUNTER — Ambulatory Visit: Admitting: Hematology and Oncology

## 2023-08-13 ENCOUNTER — Encounter: Admitting: Dietician

## 2023-08-13 ENCOUNTER — Ambulatory Visit

## 2023-08-13 ENCOUNTER — Other Ambulatory Visit

## 2023-08-18 ENCOUNTER — Other Ambulatory Visit: Payer: Self-pay | Admitting: *Deleted

## 2023-08-18 ENCOUNTER — Inpatient Hospital Stay: Attending: Hematology and Oncology

## 2023-08-18 ENCOUNTER — Inpatient Hospital Stay

## 2023-08-18 ENCOUNTER — Inpatient Hospital Stay (HOSPITAL_BASED_OUTPATIENT_CLINIC_OR_DEPARTMENT_OTHER): Attending: Hematology and Oncology | Admitting: Hematology and Oncology

## 2023-08-18 VITALS — BP 146/81 | HR 86 | Temp 98.2°F | Resp 16 | Wt 182.5 lb

## 2023-08-18 DIAGNOSIS — Z882 Allergy status to sulfonamides status: Secondary | ICD-10-CM | POA: Insufficient documentation

## 2023-08-18 DIAGNOSIS — Z880 Allergy status to penicillin: Secondary | ICD-10-CM | POA: Diagnosis not present

## 2023-08-18 DIAGNOSIS — E039 Hypothyroidism, unspecified: Secondary | ICD-10-CM | POA: Diagnosis not present

## 2023-08-18 DIAGNOSIS — E876 Hypokalemia: Secondary | ICD-10-CM | POA: Insufficient documentation

## 2023-08-18 DIAGNOSIS — Z823 Family history of stroke: Secondary | ICD-10-CM | POA: Insufficient documentation

## 2023-08-18 DIAGNOSIS — Z90710 Acquired absence of both cervix and uterus: Secondary | ICD-10-CM | POA: Insufficient documentation

## 2023-08-18 DIAGNOSIS — Z825 Family history of asthma and other chronic lower respiratory diseases: Secondary | ICD-10-CM | POA: Insufficient documentation

## 2023-08-18 DIAGNOSIS — F17210 Nicotine dependence, cigarettes, uncomplicated: Secondary | ICD-10-CM | POA: Insufficient documentation

## 2023-08-18 DIAGNOSIS — M79644 Pain in right finger(s): Secondary | ICD-10-CM | POA: Diagnosis not present

## 2023-08-18 DIAGNOSIS — Z5112 Encounter for antineoplastic immunotherapy: Secondary | ICD-10-CM | POA: Insufficient documentation

## 2023-08-18 DIAGNOSIS — Z79899 Other long term (current) drug therapy: Secondary | ICD-10-CM | POA: Diagnosis not present

## 2023-08-18 DIAGNOSIS — I10 Essential (primary) hypertension: Secondary | ICD-10-CM | POA: Insufficient documentation

## 2023-08-18 DIAGNOSIS — C3431 Malignant neoplasm of lower lobe, right bronchus or lung: Secondary | ICD-10-CM | POA: Insufficient documentation

## 2023-08-18 DIAGNOSIS — Z8249 Family history of ischemic heart disease and other diseases of the circulatory system: Secondary | ICD-10-CM | POA: Diagnosis not present

## 2023-08-18 DIAGNOSIS — J449 Chronic obstructive pulmonary disease, unspecified: Secondary | ICD-10-CM | POA: Diagnosis not present

## 2023-08-18 DIAGNOSIS — G8929 Other chronic pain: Secondary | ICD-10-CM | POA: Diagnosis not present

## 2023-08-18 DIAGNOSIS — Z8261 Family history of arthritis: Secondary | ICD-10-CM | POA: Insufficient documentation

## 2023-08-18 DIAGNOSIS — Z888 Allergy status to other drugs, medicaments and biological substances status: Secondary | ICD-10-CM | POA: Diagnosis not present

## 2023-08-18 DIAGNOSIS — M791 Myalgia, unspecified site: Secondary | ICD-10-CM | POA: Diagnosis not present

## 2023-08-18 DIAGNOSIS — Z90721 Acquired absence of ovaries, unilateral: Secondary | ICD-10-CM | POA: Insufficient documentation

## 2023-08-18 DIAGNOSIS — M1990 Unspecified osteoarthritis, unspecified site: Secondary | ICD-10-CM | POA: Diagnosis not present

## 2023-08-18 DIAGNOSIS — E785 Hyperlipidemia, unspecified: Secondary | ICD-10-CM | POA: Insufficient documentation

## 2023-08-18 DIAGNOSIS — Z814 Family history of other substance abuse and dependence: Secondary | ICD-10-CM | POA: Insufficient documentation

## 2023-08-18 DIAGNOSIS — Z886 Allergy status to analgesic agent status: Secondary | ICD-10-CM | POA: Diagnosis not present

## 2023-08-18 DIAGNOSIS — Z95828 Presence of other vascular implants and grafts: Secondary | ICD-10-CM

## 2023-08-18 DIAGNOSIS — Z8719 Personal history of other diseases of the digestive system: Secondary | ICD-10-CM | POA: Insufficient documentation

## 2023-08-18 DIAGNOSIS — Z9049 Acquired absence of other specified parts of digestive tract: Secondary | ICD-10-CM | POA: Diagnosis not present

## 2023-08-18 DIAGNOSIS — M5451 Vertebrogenic low back pain: Secondary | ICD-10-CM | POA: Diagnosis not present

## 2023-08-18 DIAGNOSIS — Z79890 Hormone replacement therapy (postmenopausal): Secondary | ICD-10-CM | POA: Diagnosis not present

## 2023-08-18 DIAGNOSIS — F32A Depression, unspecified: Secondary | ICD-10-CM | POA: Insufficient documentation

## 2023-08-18 DIAGNOSIS — Z820 Family history of epilepsy and other diseases of the nervous system: Secondary | ICD-10-CM | POA: Insufficient documentation

## 2023-08-18 DIAGNOSIS — Z79620 Long term (current) use of immunosuppressive biologic: Secondary | ICD-10-CM | POA: Diagnosis not present

## 2023-08-18 DIAGNOSIS — Z809 Family history of malignant neoplasm, unspecified: Secondary | ICD-10-CM | POA: Insufficient documentation

## 2023-08-18 LAB — CBC WITH DIFFERENTIAL (CANCER CENTER ONLY)
Abs Immature Granulocytes: 0.05 K/uL (ref 0.00–0.07)
Basophils Absolute: 0.1 K/uL (ref 0.0–0.1)
Basophils Relative: 1 %
Eosinophils Absolute: 0.7 K/uL — ABNORMAL HIGH (ref 0.0–0.5)
Eosinophils Relative: 9 %
HCT: 41.2 % (ref 36.0–46.0)
Hemoglobin: 13.5 g/dL (ref 12.0–15.0)
Immature Granulocytes: 1 %
Lymphocytes Relative: 31 %
Lymphs Abs: 2.3 K/uL (ref 0.7–4.0)
MCH: 30 pg (ref 26.0–34.0)
MCHC: 32.8 g/dL (ref 30.0–36.0)
MCV: 91.6 fL (ref 80.0–100.0)
Monocytes Absolute: 0.6 K/uL (ref 0.1–1.0)
Monocytes Relative: 8 %
Neutro Abs: 3.8 K/uL (ref 1.7–7.7)
Neutrophils Relative %: 50 %
Platelet Count: 181 K/uL (ref 150–400)
RBC: 4.5 MIL/uL (ref 3.87–5.11)
RDW: 14.6 % (ref 11.5–15.5)
WBC Count: 7.5 K/uL (ref 4.0–10.5)
nRBC: 0 % (ref 0.0–0.2)

## 2023-08-18 LAB — CMP (CANCER CENTER ONLY)
ALT: 8 U/L (ref 0–44)
AST: 13 U/L — ABNORMAL LOW (ref 15–41)
Albumin: 3.4 g/dL — ABNORMAL LOW (ref 3.5–5.0)
Alkaline Phosphatase: 85 U/L (ref 38–126)
Anion gap: 6 (ref 5–15)
BUN: 13 mg/dL (ref 8–23)
CO2: 28 mmol/L (ref 22–32)
Calcium: 8.4 mg/dL — ABNORMAL LOW (ref 8.9–10.3)
Chloride: 105 mmol/L (ref 98–111)
Creatinine: 1.22 mg/dL — ABNORMAL HIGH (ref 0.44–1.00)
GFR, Estimated: 49 mL/min — ABNORMAL LOW (ref >60)
Glucose, Bld: 96 mg/dL (ref 70–99)
Potassium: 3.9 mmol/L (ref 3.5–5.1)
Sodium: 139 mmol/L (ref 135–145)
Total Bilirubin: 0.4 mg/dL (ref 0.0–1.2)
Total Protein: 6.1 g/dL — ABNORMAL LOW (ref 6.5–8.1)

## 2023-08-18 LAB — TSH: TSH: 0.316 u[IU]/mL — ABNORMAL LOW (ref 0.350–4.500)

## 2023-08-18 MED ORDER — SODIUM CHLORIDE 0.9% FLUSH
10.0000 mL | INTRAVENOUS | Status: DC | PRN
  Administered 2023-08-18: 10 mL

## 2023-08-18 MED ORDER — LIDOCAINE-PRILOCAINE 2.5-2.5 % EX CREA: 1.0000 | TOPICAL_CREAM | CUTANEOUS | 2 refills | Status: AC | PRN

## 2023-08-18 MED ORDER — ONDANSETRON HCL 4 MG/2ML IJ SOLN
8.0000 mg | Freq: Once | INTRAMUSCULAR | Status: AC
  Administered 2023-08-18: 8 mg via INTRAVENOUS
  Filled 2023-08-18: qty 4

## 2023-08-18 MED ORDER — SODIUM CHLORIDE 0.9% FLUSH
10.0000 mL | Freq: Once | INTRAVENOUS | Status: AC
Start: 2023-08-18 — End: 2023-08-18
  Administered 2023-08-18: 10 mL

## 2023-08-18 MED ORDER — HEPARIN SOD (PORK) LOCK FLUSH 100 UNIT/ML IV SOLN
500.0000 [IU] | Freq: Once | INTRAVENOUS | Status: AC | PRN
  Administered 2023-08-18: 500 [IU]

## 2023-08-18 MED ORDER — SODIUM CHLORIDE 0.9 % IV SOLN
1200.0000 mg | Freq: Once | INTRAVENOUS | Status: AC
  Administered 2023-08-18: 1200 mg via INTRAVENOUS
  Filled 2023-08-18: qty 20

## 2023-08-18 MED ORDER — SODIUM CHLORIDE 0.9 % IV SOLN: Freq: Once | INTRAVENOUS | Status: AC

## 2023-08-18 NOTE — Progress Notes (Unsigned)
 [facility] [phone]   [phone]  PROGRESS NOTE  Patient Care Team: [name], FNP as PCP - General (Family Medicine) O'Neal, Darryle Ned, MD as PCP - Cardiology (Cardiology) [name], MD (Inactive) as Attending Physician (Gastroenterology) [name], MD as Consulting Physician (Orthopedic Surgery) [name], MD as Consulting Physician (Orthopedic Surgery) [name], MD (Inactive) as Consulting Physician (Neurology) [name], MD as Consulting Physician (Neurology) [name], [facility] (Inactive) (Pharmacist) Pll[facility], [facility]   Hematological/Oncological History # Small Cell Lung Cancer, Extensive Stage 07/05/2020: CT abdomen for lower abdominal pain. New pulmonary nodular density noted 07/06/2020: CT chest showed 2.2 cm macrolobulated right lower lobe pulmonary nodule (favored) versus pathologically enlarged infrahilar lymph node 10/13/2020: PET CT scan performed, findings show 2 cm right lower lobe lung mass is hypermetabolic and consistent with primary lung neoplasm. Additionally found hypermetabolic 17 mm soft tissue lesion between the descending duodenum and the pancreatic head  10/19/2020: EGD to biopsy hypermetabolic lymph node. Biopsy results show small cell lung cancer 10/26/2020: establish care with Dr. Federico  11/13/2020: Cycle 1 Day 1 of Carbo/Etop/Atezolizumab  12/04/2020: Cycle 2 Day 1 of Carbo/Etop/Atezolizumab  11/22-11/25/2022: admitted for E. Coli bacteremia/sepsis. Start of Cycle 3 delayed. 01/02/2021: Cycle 3 Day 1 of Carbo/Etop/Atezolizumab  01/23/2021: Cycle 4 Day 1 of Carbo/Etop/Atezolizumab  02/22/2021: Cycle 5 Day 1 of Atezolizumab  Maintenance. Delayed start due to patient's COVID infection.  03/14/2021: Cycle 6 Day 1 of Atezolizumab  Maintenance 04/05/2021: Cycle 7 Day 1 of Atezolizumab  Maintenance 04/25/2021: Cycle 8 Day 1 of Atezolizumab   Maintenance 05/16/2021: Cycle 9 Day 1 of Atezolizumab  Maintenance 06/06/2021: Cycle 10 Day 1 of Atezolizumab  Maintenance 06/27/2021:  Cycle 11 Day 1 of Atezolizumab  Maintenance 07/18/2021: Cycle 12 Day 1 of Atezolizumab  Maintenance 08/08/2021: Cycle 13 Day 1 of Atezolizumab  Maintenance 08/29/2021: Cycle 14 Day 1 of Atezolizumab  Maintenance 09/19/2021: Cycle 15 Day 1 of Atezolizumab  Maintenance 10/10/2021: Cycle 16 Day 1 of Atezolizumab  Maintenance 10/31/2021: Cycle 17 Day 1 of Atezolizumab  Maintenance 11/21/2021: Cycle 18 Day 1 of Atezolizumab  Maintenance 12/12/2021: Cycle 19 Day 1 of Atezolizumab  Maintenance 01/02/2022: Cycle 20 Day 1 of Atezolizumab  Maintenance 01/23/2022: Cycle 21 Day 1 of Atezolizumab  Maintenance 02/13/2022: treatment HELD due to diarrhea.  03/06/2022: Cycle 22 Day 1 of Atezolizumab  Maintenance 03/27/2022: Cycle 23 Day 1 of Atezolizumab  Maintenance 04/17/2022: Cycle 24 Day 1 of Atezolizumab  Maintenance 05/08/2022:  Cycle 25 Day 1 of Atezolizumab  Maintenance (Held due to patient preference for treatment holiday) 05/29/2022: Cycle 25 Day 1 of Atezolizumab  Maintenance  06/19/2022: Cycle 26 Day 1 of Atezolizumab  Maintenance  07/12/2022: Cycle 27 Day 1 of Atezolizumab  Maintenance 07/31/2022: Cycle 28 Day 1 of Atezolizumab  Maintenance HELD per patient request for fatigue.  08/22/2022: Cycle 28 Day 1 of Atezolizumab  Maintenance 09/11/2022: Cycle 29 Day 1 of Atezolizumab  Maintenance 10/03/2022: Cycle 30 Day 1 of Atezolizumab  Maintenance 10/23/2022: Cycle 31 Day 1 of Atezolizumab  Maintenance 11/13/2022: Cycle 32 Day 1 of Atezolizumab  Maintenance 12/24/2022: Cycle 33 Day 1 of Atezolizumab  Maintenance 01/15/2023: Cycle 34 Day 1 of Atezolizumab  Maintenance 02/05/2023: Cycle 35 Day 1 of Atezolizumab  Maintenance 02/26/2023: Cycle 36 Day 1 of Atezolizumab  Maintenance 04/28/2023: Cycle 37 Day 1 of Atezolizumab  Maintenance 04/30/2023: Cycle 38 Day 1 of Atezolizumab  Maintenance 05/21/2023: Cycle 39 Day 1 of  Atezolizumab  Maintenance 06/11/2023: Cycle 40 Day 1 of Atezolizumab  Maintenance 07/28/2023: Cycle 41 Day 1 of Atezolizumab  Maintenance 08/18/2023: Cycle 42 Day 1 of Atezolizumab  Maintenance  Interval History:  [name] 68 y.o. female with  medical history significant for extensive stage small cell lung cancer who presents for a follow up visit. The patient's last visit was on 07/28/2023.  She presents today to start cycle 42 of maintenance atezolizumab . She is unaccompanied for this visit.   On exam today [name] reports reports that she is having a rough summer.  She is taking care of her brother and is running to death.  She reports that her energy levels have been very low and her appetite is poor, though she is gaining weight.  She reports that there are days where she does not eat food at all.  She reports that she is currently in the process of getting a new primary care provider and has had period of times where she is not taking her home medications.  She notes that she is doing her best to try to drink plenty of water .  She notes that she is currently working towards getting physical therapy to help with her neuropathy in her big toe.  She otherwise denies any fevers, chills, sweats, nausea, vomiting or diarrhea.  A full 10 point ROS is otherwise negative.  Overall she is willing and able to proceed with treatment today.  MEDICAL HISTORY:  Past Medical History:  Diagnosis Date   Allergic rhinitis    Anemia    Anxiety    Chicken pox    Chronic back pain    COPD (chronic obstructive pulmonary disease) (HCC)    Depression    DM (diabetes mellitus) (HCC)    Essential hypertension    GERD (gastroesophageal reflux disease)    Headache    migraines   History of gastritis    EGD 2015   History of home oxygen  therapy    2 liters at hs last 6 months   Hyperlipidemia    Hypothyroidism    Migraines    Osteoarthritis    oa   Scoliosis    Small cell lung cancer (HCC)    Stage 4     SURGICAL HISTORY: Past Surgical History:  Procedure Laterality Date   APPENDECTOMY     1985   BIOPSY  07/24/2017   Procedure: BIOPSY;  Surgeon: [name], MD;  Location: [facility];  Service: Endoscopy;;   CARDIAC CATHETERIZATION N/A 10/31/2015   Procedure: Left Heart Cath and Coronary Angiography;  Surgeon: Alm LELON Clay, MD;  Location: [facility];  Service: Cardiovascular;  Laterality: N/A;   CARPAL TUNNEL RELEASE Left    CARPAL TUNNEL RELEASE Right    CATARACT EXTRACTION W/PHACO Left 02/07/2023   Procedure: CATARACT EXTRACTION PHACO AND INTRAOCULAR LENS PLACEMENT (IOC);  Surgeon: Juli Blunt, MD;  Location: [facility];  Service: Ophthalmology;  Laterality: Left;  CDE: 3.51   CATARACT EXTRACTION W/PHACO Right 02/21/2023   Procedure: CATARACT EXTRACTION PHACO AND INTRAOCULAR LENS PLACEMENT (IOC);  Surgeon: Juli Blunt, MD;  Location: [facility];  Service: Ophthalmology;  Laterality: Right;  CDE: 2.94   CHOLECYSTECTOMY  late 1980's   COLONOSCOPY WITH PROPOFOL  N/A 07/24/2017   Procedure: COLONOSCOPY WITH PROPOFOL ;  Surgeon: [name], MD;  Location: [facility];  Service: Endoscopy;  Laterality: N/A;   ESOPHAGOGASTRODUODENOSCOPY N/A 07/24/2017   Procedure: ESOPHAGOGASTRODUODENOSCOPY (EGD);  Surgeon: [name], MD;  Location: [facility];  Service: Endoscopy;  Laterality: N/A;   ESOPHAGOGASTRODUODENOSCOPY (EGD) WITH PROPOFOL  N/A 10/19/2020   Procedure: ESOPHAGOGASTRODUODENOSCOPY (EGD) WITH PROPOFOL ;  Surgeon: [name], MD;  Location: [facility];  Service: Endoscopy;  Laterality: N/A;   FINE NEEDLE  ASPIRATION N/A 10/19/2020   Procedure: FINE NEEDLE ASPIRATION (FNA) LINEAR;  Surgeon: [name], MD;  Location: [facility];  Service: Endoscopy;  Laterality: N/A;   GALLBLADDER SURGERY  1991   HIP CLOSED REDUCTION Right 01/08/2016   Procedure: CLOSED MANIPULATION HIP;  Surgeon: Reyes Billing, MD;  Location: [facility];  Service: Orthopedics;   Laterality: Right;   HIP CLOSED REDUCTION Right 01/19/2016   Procedure: ATTEMPTED CLOSED REDUCTION RIGHT HIP;  Surgeon: Norleen Armor, MD;  Location: [facility];  Service: Orthopedics;  Laterality: Right;   HIP CLOSED REDUCTION Right 01/20/2016   Procedure: CLOSED REDUCTION RIGHT TOTAL HIP;  Surgeon: Donnice Car, MD;  Location: [facility];  Service: Orthopedics;  Laterality: Right;   HIP CLOSED REDUCTION Right 02/17/2016   Procedure: CLOSED REDUCTION RIGHT TOTAL HIP;  Surgeon: Redell Shoals, MD;  Location: [facility];  Service: Orthopedics;  Laterality: Right;   HIP CLOSED REDUCTION Right 02/28/2016   Procedure: CLOSED REDUCTION HIP;  Surgeon: Selinda Belvie Gosling, MD;  Location: [facility];  Service: Orthopedics;  Laterality: Right;   IR IMAGING GUIDED PORT INSERTION  11/01/2020   POLYPECTOMY  07/24/2017   Procedure: POLYPECTOMY;  Surgeon: [name], MD;  Location: [facility];  Service: Endoscopy;;   TONSILLECTOMY     TOTAL ABDOMINAL HYSTERECTOMY     1985, with 1 ovary removed and 2 nd ovary removed 2003   TOTAL HIP ARTHROPLASTY Right    Original surgery 2006 with revision 2010   TOTAL HIP REVISION Right 01/01/2016   Procedure: TOTAL HIP REVISION;  Surgeon: Donnice Car, MD;  Location: [facility];  Service: Orthopedics;  Laterality: Right;   TOTAL HIP REVISION Right 03/02/2016   Procedure: TOTAL HIP REVISION;  Surgeon: Car Donnice, MD;  Location: [facility];  Service: Orthopedics;  Laterality: Right;   TOTAL HIP REVISION Right 09/02/2016   Procedure: Right hip constrained liner- posterior;  Surgeon: Car Donnice, MD;  Location: [facility];  Service: Orthopedics;  Laterality: Right;   ULNAR NERVE TRANSPOSITION Right    UPPER ESOPHAGEAL ENDOSCOPIC ULTRASOUND (EUS) N/A 10/19/2020   Procedure: UPPER ESOPHAGEAL ENDOSCOPIC ULTRASOUND (EUS);  Surgeon: [name], MD;  Location: [facility];  Service: Endoscopy;  Laterality: N/A;  periduodenal lesion    SOCIAL HISTORY: Social History   Socioeconomic History    Marital status: Married    Spouse name: Not on file   Number of children: 2   Years of education: Not on file   Highest education level: 12th grade  Occupational History   Occupation: disabled   Occupation: disabled  Tobacco Use   Smoking status: Every Day    Current packs/day: 2.00    Average packs/day: 2.0 packs/day for 46.0 years (92.0 ttl pk-yrs)    Types: Cigarettes   Smokeless tobacco: Never   Tobacco comments:    2 packs of cigarettes smoked daily 12/13/21- declines smoking cessation  Vaping Use   Vaping status: Never Used  Substance and Sexual Activity   Alcohol use: No   Drug use: No   Sexual activity: Not Currently    Partners: Male  Other Topics Concern   Not on file  Social History Narrative   Right handed    Caffeine~ 2 cups per day    Lives at home with husband (strained relationship)   Primary caretaker for disabled brother who had aneurism   Daughter died 2018-07-07   Social Drivers of Health   Financial Resource Strain: Low Risk  (07/31/2023)   [facility]   Overall Financial Resource Strain (CARDIA)    How hard is it for you to pay for the very basics like food, housing, medical care, and heating?: Not very hard  Food Insecurity: No Food Insecurity (07/31/2023)   Received from [facility]   Hunger Vital Sign    Within the past 12 months, you worried that your food would run out before you got the money to buy more.: Never true    Within the past 12 months, the food you bought just didn't last and you didn't have money to get more.: Never true  Recent Concern: Food Insecurity - Food Insecurity Present (05/20/2023)   Hunger Vital Sign    Worried About Running Out of Food in the Last Year: Sometimes true    Ran Out of Food in the Last Year: Sometimes true  Transportation Needs: No Transportation Needs (07/31/2023)   Received from [facility]   PRAPARE - Transportation    Lack of Transportation (Medical): No    Lack of Transportation  (Non-Medical): No  Physical Activity: Inactive (05/20/2023)   Exercise Vital Sign    Days of Exercise per Week: 0 days    Minutes of Exercise per Session: 0 min  Stress: No Stress Concern Present (05/20/2023)   [employer] of Occupational Health - Occupational Stress Questionnaire    Feeling of Stress : Only a little  Recent Concern: Stress - Stress Concern Present (03/31/2023)   [employer] of Occupational Health - Occupational Stress Questionnaire    Feeling of Stress : Very much  Social Connections: Moderately Integrated (05/20/2023)   Social Connection and Isolation Panel    Frequency of Communication with Friends and Family: More than three times a week    Frequency of Social Gatherings with Friends and Family: More than three times a week    Attends Religious Services: 1 to 4 times per year    Active Member of [employer] or Organizations: No    Attends Banker Meetings: Never    Marital Status: Married  Catering manager Violence: Not At Risk (07/31/2023)   Received from [facility]   Humiliation, Afraid, Rape, and Kick questionnaire    Within the last year, have you been afraid of your partner or ex-partner?: No    Within the last year, have you been humiliated or emotionally abused in other ways by your partner or ex-partner?: No    Within the last year, have you been kicked, hit, slapped, or otherwise physically hurt by your partner or ex-partner?: No    Within the last year, have you been raped or forced to have any kind of sexual activity by your partner or ex-partner?: No    FAMILY HISTORY: Family History  Problem Relation Age of Onset   COPD Mother    Heart disease Mother    Lung disease Father        Asbestosis   Heart attack Father    Heart disease Father    Cerebral aneurysm Brother    Aneurysm Brother        Brain   Drug abuse Daughter    Epilepsy Son    Alcohol abuse Son    Drug abuse Son    Arthritis Maternal Grandmother    Heart disease  Maternal Grandmother    Asthma Maternal Grandfather    Cancer Maternal Grandfather    Arthritis Paternal Grandmother    Heart disease Paternal Grandmother    Stroke Paternal Grandmother  Early death Paternal Grandfather    Heart disease Paternal Grandfather     ALLERGIES:  is allergic to nisoldipine, metformin  and related, nsaids, pregabalin, wellbutrin  [bupropion ], aleve [naproxen sodium], penicillins, and sulfonamide derivatives.  MEDICATIONS:  Current Outpatient Medications  Medication Sig Dispense Refill   albuterol  (PROAIR  HFA) 108 (90 Base) MCG/ACT inhaler 2 puffs every 4 hours as needed only  if your can't catch your breath 18 g 5   ALPRAZolam  (XANAX ) 1 MG tablet TAKE 1 TABLET BY MOUTH THREE TIMES DAILY AS NEEDED FOR ANXIETY 90 tablet 2   ARIPiprazole  (ABILIFY ) 5 MG tablet Take 1 tablet (5 mg total) by mouth daily. 30 tablet 5   atorvastatin  (LIPITOR) 20 MG tablet TAKE 1 TABLET BY MOUTH AT BEDTIME 90 tablet 1   benzonatate  (TESSALON ) 100 MG capsule TAKE ONE CAPSULE BY MOUTH THREE TIMES DAILY 90 capsule 1   calcipotriene  (DOVONOX) 0.005 % cream Apply to affected area bid when on break from steroid cream 120 g 3   cyclobenzaprine  (FLEXERIL ) 10 MG tablet Take 10 mg by mouth 3 (three) times daily as needed.     dicyclomine  (BENTYL ) 10 MG capsule TAKE ONE CAPSULE BY MOUTH EVERY 8 HOURS AS NEEDED FOR SPASMS. please keep your MAY APPOINTMENT FOR further refills 60 capsule 1   diphenoxylate -atropine  (LOMOTIL ) 2.5-0.025 MG tablet TAKE 1 TABLET BY MOUTH FOUR TIMES DAILY AS NEEDED FOR diarrhea OR loose stools 60 tablet 0   DULoxetine  (CYMBALTA ) 60 MG capsule TAKE TWO CAPSULES BY MOUTH EVERY DAY 180 capsule 1   FEROSUL 325 (65 Fe) MG tablet TAKE 1 TABLET BY MOUTH DAILY WITH BREAKFAST 30 tablet 3   fluconazole  (DIFLUCAN ) 150 MG tablet Take 150 mg by mouth once.     fluticasone  (FLONASE ) 50 MCG/ACT nasal spray INSTILL 2 SPRAYS IN EACH NOSTRIL EVERY DAY 16 g 6   furosemide  (LASIX ) 20 MG tablet  TAKE 1 TABLET BY MOUTH TWICE DAILY, IF SWELLING IMPROVES CAN HOLD MEDICATION 90 tablet 0   HYDROcodone -acetaminophen  (NORCO) 10-325 MG tablet Take 1 tablet by mouth every 4 (four) hours as needed. To fill 08/29/23- 180 tablet 0   HYDROcodone -acetaminophen  (NORCO) 10-325 MG tablet Take 1 tablet by mouth every 4 (four) hours as needed. 180 tablet 0   hydrOXYzine  (ATARAX ) 10 MG tablet TAKE 1 TABLET BY MOUTH THREE TIMES DAILY AS NEEDED FOR ITCHING 30 tablet 2   levothyroxine  (SYNTHROID ) 137 MCG tablet Take 1 tablet (137 mcg total) by mouth daily before breakfast. 90 tablet 3   lidocaine -prilocaine  (EMLA ) cream Apply 1 Application topically as needed. 30 g 2   loratadine  (CLARITIN ) 10 MG tablet Take 10 mg by mouth daily.     magic mouthwash (nystatin , diphenhydrAMINE , alum & mag hydroxide) suspension mixture Swish and spit 5 mLs 3 (three) times daily as needed. 180 mL 0   meclizine  (ANTIVERT ) 12.5 MG tablet Take 1 tablet (12.5 mg total) by mouth 2 (two) times daily. 60 tablet 1   meloxicam  (MOBIC ) 15 MG tablet TAKE 1 TABLET BY MOUTH EVERY DAY 30 tablet 2   OLANZapine  (ZYPREXA ) 10 MG tablet Take 1 tablet (10 mg total) by mouth at bedtime. 30 tablet 2   omeprazole  (PRILOSEC) 40 MG capsule TAKE ONE CAPSULE BY MOUTH TWICE DAILY 180 capsule 1   ondansetron  (ZOFRAN ) 8 MG tablet TAKE 1 TABLET BY MOUTH TWICE DAILY 60 tablet 1   permethrin  (ELIMITE ) 5 % cream      potassium chloride  SA (KLOR-CON  M) 20 MEQ tablet TAKE 1 TABLET BY  MOUTH TWICE DAILY 60 tablet 2   prochlorperazine  (COMPAZINE ) 10 MG tablet Take 1 tablet (10 mg total) by mouth every 6 (six) hours as needed for nausea or vomiting. 30 tablet 0   Rimegepant Sulfate (NURTEC) 75 MG TBDP TAKE 1 TABLET BY MOUTH DAILY AS NEEDED FOR MIGRAINE, take WITHIN 15 MINUTES of initial symptoms 8 tablet 1   topiramate  (TOPAMAX ) 100 MG tablet TAKE 1 TABLET BY MOUTH AT BEDTIME 90 tablet 1   triamcinolone  ointment (KENALOG ) 0.5 % Apply 1 Application topically 3 (three) times  daily. Apply thin-layer on affected areas, no more than 2 weeks consistently. 60 g 2   triamterene -hydrochlorothiazide  (MAXZIDE -25) 37.5-25 MG tablet Take 1 tablet by mouth daily.     umeclidinium-vilanterol (ANORO ELLIPTA ) 62.5-25 MCG/ACT AEPB Inhale 1 puff into the lungs daily. 60 each 11   No current facility-administered medications for this visit.    REVIEW OF SYSTEMS:   Constitutional: ( - ) fevers, ( - )  chills , ( - ) night sweats Eyes: ( - ) blurriness of vision, ( - ) double vision, ( - ) watery eyes Ears, nose, mouth, throat, and face: ( - ) mucositis, ( - ) sore throat Respiratory: ( - ) cough, ( -) dyspnea, ( - ) wheezes Cardiovascular: ( - ) palpitation, ( - ) chest discomfort, ( - ) lower extremity swelling Gastrointestinal:  ( +) nausea, ( - ) heartburn, ( - ) change in bowel habits Skin: ( - ) abnormal skin rashes Lymphatics: ( - ) new lymphadenopathy, ( - ) easy bruising Neurological: (+ ) numbness, ( - ) tingling, ( - ) new weaknesses Behavioral/Psych: ( - ) mood change, ( - ) new changes  All other systems were reviewed with the patient and are negative.  PHYSICAL EXAMINATION: ECOG PERFORMANCE STATUS: 1 - Symptomatic but completely ambulatory  Vitals:   08/18/23 1000  BP: (!) 146/81  Pulse: 86  Resp: 16  Temp: 98.2 F (36.8 C)  SpO2: 97%    Filed Weights   08/18/23 1000  Weight: 182 lb 8 oz (82.8 kg)     GENERAL: Well-appearing middle-age Caucasian female, alert, no distress and comfortable SKIN: skin color, texture, turgor are normal, no rashes or significant lesions EYES: conjunctiva are pink and non-injected, sclera clear LUNGS:  normal breathing effort. Diffuse wheezing hear on ausculation.  HEART: regular rate & rhythm and no murmurs. Mild bilateral lower extremity edema Musculoskeletal: no cyanosis of digits and no clubbing  PSYCH: alert & oriented x 3, fluent speech NEURO: no focal motor/sensory deficits  LABORATORY DATA:  I have reviewed  the data as listed    Latest Ref Rng & Units 08/18/2023   10:15 AM 07/28/2023   12:37 PM 06/11/2023   10:57 AM  CBC  WBC 4.0 - 10.5 K/uL 7.5  7.2  8.8   Hemoglobin 12.0 - 15.0 g/dL 86.4  86.6  85.9   Hematocrit 36.0 - 46.0 % 41.2  40.6  41.8   Platelets 150 - 400 K/uL 181  157  173        Latest Ref Rng & Units 08/18/2023   10:15 AM 07/28/2023   12:37 PM 06/11/2023   10:57 AM  CMP  Glucose 70 - 99 mg/dL 96  87  88   BUN 8 - 23 mg/dL 13  13  14    Creatinine 0.44 - 1.00 mg/dL 8.77  9.06  8.88   Sodium 135 - 145 mmol/L 139  142  139  Potassium 3.5 - 5.1 mmol/L 3.9  4.2  3.6   Chloride 98 - 111 mmol/L 105  113  105   CO2 22 - 32 mmol/L 28  24  26    Calcium  8.9 - 10.3 mg/dL 8.4  8.3  8.4   Total Protein 6.5 - 8.1 g/dL 6.1  5.9  6.2   Total Bilirubin 0.0 - 1.2 mg/dL 0.4  0.4  0.4   Alkaline Phos 38 - 126 U/L 85  76  99   AST 15 - 41 U/L 13  11  18    ALT 0 - 44 U/L 8  6  14      No results found for: MPROTEIN Lab Results  Component Value Date   KPAFRELGTCHN 0.75 06/30/2014   LAMBDASER 3.78 (H) 06/30/2014   KAPLAMBRATIO 0.20 (L) 06/30/2014     RADIOGRAPHIC STUDIES: No results found.    ASSESSMENT & PLAN [name] 68 y.o. female with medical history significant for extensive stage small cell lung cancer who presents for a follow up visit.   After review of the labs, review of the records, and discussion with the patient the patients findings are most consistent with extensive stage small cell lung cancer with metastasis from the right lower lobe to the lymph nodes of the abdomen.  At this time we will pursue triple therapy with carboplatin , etoposide , and atezolizumab .  After 4 cycles we will convert to maintenance atezolizumab  alone.  We previously discussed the risks and benefits of this therapy and the patient was in agreement to proceed with this treatment.  The treatment of choice consist of carboplatin , etoposide , and atezolizumab .  The regimen consists of  carboplatin  AUC of 5 IV on day 1, etoposide  100 mg per metered squared IV on day 1, 2, and 3 and atezolizumab  1200 mg on day 1.  This continues for 21-day cycles.  After 4 cycles the patient proceeds with atezolizumab  maintenance therapy alone.    # Small Cell Lung Cancer, Extensive Stage -- MRI of the brain shows no evidence of intracranial spread --Findings are currently consistent with metastatic small cell lung cancer with metastatic spread to the lymph nodes of the abdomen -- Started carboplatin , etoposide , and atezolizumab  on 11/13/2020. Transitioned to maintenance Atezolizumab  on 02/22/2021.  Plan: --today is Cycle 42 Day 1 of Atezolizumab  maintenance.  --labs today were reviewed and adequate for treatment. WBC 7.5, hemoglobin 13.5, MCV 91.6, platelets 181, creatinine and LFTs in range -- Most recent CT CAP from 07/07/2023 which shows no evidence of recurrence. Repeat scan due in  Sept 2025.   --Proceed with treatment without any dose modifications.  --RTC in 3 weeks for a follow up before Cycle 43  # Early Cirrhosis --noted on MR abdomen from 05/22/2022. US  with elastography performed on 06/18/2022. --continue to follow with GI.   #Right renal lesion: --Noted to be benign on most recent MRI of the abdomen.  #Diarrhea-improved --stool sent for C. Diff and GI pathology panel, all negative --patient following with GI and found to have pancreatic insufficiency and started on creon  with improvement of symptoms.   #Nausea: --Symptoms improved with zofran  and olanzapine .  --Added IV zofran  to infusion premeds for better symptom control.   #Hypokalemia:  --currently taking potassium chloride  20 meq BID.   --potassium level is 4.2 today. Continue PO supplementation.   #Lower extremity edema --Currently takes lasix  20 mg 1-2 times a day. --Improved with compression stockings.   #Neuropathy involving fingers/feet: --Patient currently takes Cymbalta    #  Supportive Care -- chemotherapy  education complete -- port placed -- zofran  8mg  q8H PRN and compazine  10mg  PO q6H for nausea -- EMLA  cream for port  No orders of the defined types were placed in this encounter.  All questions were answered. The patient knows to call the clinic with any problems, questions or concerns.  I have spent a total of 30 minutes minutes of face-to-face and non-face-to-face time, preparing to see the patient, performing a medically appropriate examination, counseling and educating the patient, ordering medications/tests, documenting clinical information in the electronic health record, and care coordination.   Norleen IVAR Kidney, MD Department of Hematology/Oncology [facility] Phone: [phone] Pager: [phone] Email: [email or web address]   08/19/2023 8:32 PM

## 2023-08-18 NOTE — Patient Instructions (Signed)
 [facility] - A DEPT OF [facility]  Discharge Instructions: Thank you for choosing [facility] to provide your oncology and hematology care.   If you have a lab appointment with the Cancer Center, please go directly to the Cancer Center and check in at the registration area.   Wear comfortable clothing and clothing appropriate for easy access to any Portacath or PICC line.   We strive to give you quality time with your provider. You may need to reschedule your appointment if you arrive late (15 or more minutes).  Arriving late affects you and other patients whose appointments are after yours.  Also, if you miss three or more appointments without notifying the office, you may be dismissed from the clinic at the provider's discretion.      For prescription refill requests, have your pharmacy contact our office and allow 72 hours for refills to be completed.    Today you received the following chemotherapy and/or immunotherapy agent: Atezolizumab  (Tecentriq )   To help prevent nausea and vomiting after your treatment, we encourage you to take your nausea medication as directed.  BELOW ARE SYMPTOMS THAT SHOULD BE REPORTED IMMEDIATELY: *FEVER GREATER THAN 100.4 F (38 C) OR HIGHER *CHILLS OR SWEATING *NAUSEA AND VOMITING THAT IS NOT CONTROLLED WITH YOUR NAUSEA MEDICATION *UNUSUAL SHORTNESS OF BREATH *UNUSUAL BRUISING OR BLEEDING *URINARY PROBLEMS (pain or burning when urinating, or frequent urination) *BOWEL PROBLEMS (unusual diarrhea, constipation, pain near the anus) TENDERNESS IN MOUTH AND THROAT WITH OR WITHOUT PRESENCE OF ULCERS (sore throat, sores in mouth, or a toothache) UNUSUAL RASH, SWELLING OR PAIN  UNUSUAL VAGINAL DISCHARGE OR ITCHING   Items with * indicate a potential emergency and should be followed up as soon as possible or go to the Emergency Department if any problems should occur.  Please show the CHEMOTHERAPY ALERT CARD or  IMMUNOTHERAPY ALERT CARD at check-in to the Emergency Department and triage nurse.  Should you have questions after your visit or need to cancel or reschedule your appointment, please contact [facility] - A DEPT [facility]  Dept: [phone]  and follow the prompts.  Office hours are 8:00 a.m. to 4:30 p.m. Monday - Friday. Please note that voicemails left after 4:00 p.m. may not be returned until the following business day.  We are closed weekends and major holidays. You have access to a nurse at all times for urgent questions. Please call the main number to the clinic Dept: [phone] and follow the prompts.   For any non-urgent questions, you may also contact your provider using MyChart. We now offer e-Visits for anyone 11 and older to request care online for non-urgent symptoms. For details visit [email or web address].   Also download the MyChart app! Go to the app store, search MyChart, open the app, select [place], and log in with your MyChart username and password.

## 2023-08-19 ENCOUNTER — Ambulatory Visit: Admitting: Physician Assistant

## 2023-08-19 ENCOUNTER — Encounter: Payer: Self-pay | Admitting: Hematology and Oncology

## 2023-08-19 DIAGNOSIS — G5793 Unspecified mononeuropathy of bilateral lower limbs: Secondary | ICD-10-CM | POA: Diagnosis not present

## 2023-08-19 DIAGNOSIS — R296 Repeated falls: Secondary | ICD-10-CM | POA: Diagnosis not present

## 2023-08-19 LAB — T4: T4, Total: 11.2 ug/dL (ref 4.5–12.0)

## 2023-08-24 ENCOUNTER — Other Ambulatory Visit: Payer: Self-pay | Admitting: Physician Assistant

## 2023-08-26 NOTE — Progress Notes (Signed)
 This encounter was created in error - please disregard.

## 2023-08-28 ENCOUNTER — Other Ambulatory Visit: Payer: Self-pay | Admitting: Gastroenterology

## 2023-08-29 ENCOUNTER — Other Ambulatory Visit: Payer: Self-pay

## 2023-08-29 DIAGNOSIS — R932 Abnormal findings on diagnostic imaging of liver and biliary tract: Secondary | ICD-10-CM

## 2023-09-02 ENCOUNTER — Encounter: Payer: Self-pay | Admitting: Hematology and Oncology

## 2023-09-07 ENCOUNTER — Other Ambulatory Visit: Payer: Self-pay | Admitting: Nurse Practitioner

## 2023-09-07 NOTE — Progress Notes (Unsigned)
 [facility] [phone]   [phone]  PROGRESS NOTE  Patient Care Team: [name], FNP as PCP - General (Family Medicine) O'Neal, Darryle Ned, MD as PCP - Cardiology (Cardiology) [name], MD (Inactive) as Attending Physician (Gastroenterology) [name], MD as Consulting Physician (Orthopedic Surgery) [name], MD as Consulting Physician (Orthopedic Surgery) [name], MD (Inactive) as Consulting Physician (Neurology) [name], MD as Consulting Physician (Neurology) [name], [facility] (Inactive) (Pharmacist) Pllc, [facility]   Hematological/Oncological History # Small Cell Lung Cancer, Extensive Stage 07/05/2020: CT abdomen for lower abdominal pain. New pulmonary nodular density noted 07/06/2020: CT chest showed 2.2 cm macrolobulated right lower lobe pulmonary nodule (favored) versus pathologically enlarged infrahilar lymph node 10/13/2020: PET CT scan performed, findings show 2 cm right lower lobe lung mass is hypermetabolic and consistent with primary lung neoplasm. Additionally found hypermetabolic 17 mm soft tissue lesion between the descending duodenum and the pancreatic head  10/19/2020: EGD to biopsy hypermetabolic lymph node. Biopsy results show small cell lung cancer 10/26/2020: establish care with Dr. Federico  11/13/2020: Cycle 1 Day 1 of Carbo/Etop/Atezolizumab  12/04/2020: Cycle 2 Day 1 of Carbo/Etop/Atezolizumab  11/22-11/25/2022: admitted for E. Coli bacteremia/sepsis. Start of Cycle 3 delayed. 01/02/2021: Cycle 3 Day 1 of Carbo/Etop/Atezolizumab  01/23/2021: Cycle 4 Day 1 of Carbo/Etop/Atezolizumab  02/22/2021: Cycle 5 Day 1 of Atezolizumab  Maintenance. Delayed start due to patient's COVID infection.  03/14/2021: Cycle 6 Day 1 of Atezolizumab  Maintenance 04/05/2021: Cycle 7 Day 1 of Atezolizumab  Maintenance 04/25/2021: Cycle 8 Day 1 of Atezolizumab   Maintenance 05/16/2021: Cycle 9 Day 1 of Atezolizumab  Maintenance 06/06/2021: Cycle 10 Day 1 of Atezolizumab  Maintenance 06/27/2021:  Cycle 11 Day 1 of Atezolizumab  Maintenance 07/18/2021: Cycle 12 Day 1 of Atezolizumab  Maintenance 08/08/2021: Cycle 13 Day 1 of Atezolizumab  Maintenance 08/29/2021: Cycle 14 Day 1 of Atezolizumab  Maintenance 09/19/2021: Cycle 15 Day 1 of Atezolizumab  Maintenance 10/10/2021: Cycle 16 Day 1 of Atezolizumab  Maintenance 10/31/2021: Cycle 17 Day 1 of Atezolizumab  Maintenance 11/21/2021: Cycle 18 Day 1 of Atezolizumab  Maintenance 12/12/2021: Cycle 19 Day 1 of Atezolizumab  Maintenance 01/02/2022: Cycle 20 Day 1 of Atezolizumab  Maintenance 01/23/2022: Cycle 21 Day 1 of Atezolizumab  Maintenance 02/13/2022: treatment HELD due to diarrhea.  03/06/2022: Cycle 22 Day 1 of Atezolizumab  Maintenance 03/27/2022: Cycle 23 Day 1 of Atezolizumab  Maintenance 04/17/2022: Cycle 24 Day 1 of Atezolizumab  Maintenance 05/08/2022:  Cycle 25 Day 1 of Atezolizumab  Maintenance (Held due to patient preference for treatment holiday) 05/29/2022: Cycle 25 Day 1 of Atezolizumab  Maintenance  06/19/2022: Cycle 26 Day 1 of Atezolizumab  Maintenance  07/12/2022: Cycle 27 Day 1 of Atezolizumab  Maintenance 07/31/2022: Cycle 28 Day 1 of Atezolizumab  Maintenance HELD per patient request for fatigue.  08/22/2022: Cycle 28 Day 1 of Atezolizumab  Maintenance 09/11/2022: Cycle 29 Day 1 of Atezolizumab  Maintenance 10/03/2022: Cycle 30 Day 1 of Atezolizumab  Maintenance 10/23/2022: Cycle 31 Day 1 of Atezolizumab  Maintenance 11/13/2022: Cycle 32 Day 1 of Atezolizumab  Maintenance 12/24/2022: Cycle 33 Day 1 of Atezolizumab  Maintenance 01/15/2023: Cycle 34 Day 1 of Atezolizumab  Maintenance 02/05/2023: Cycle 35 Day 1 of Atezolizumab  Maintenance 02/26/2023: Cycle 36 Day 1 of Atezolizumab  Maintenance 04/28/2023: Cycle 37 Day 1 of Atezolizumab  Maintenance 04/30/2023: Cycle 38 Day 1 of Atezolizumab  Maintenance 05/21/2023: Cycle 39 Day 1 of  Atezolizumab  Maintenance 06/11/2023: Cycle 40 Day 1 of Atezolizumab  Maintenance 07/28/2023: Cycle 41 Day 1 of Atezolizumab  Maintenance 08/18/2023: Cycle 42 Day 1 of Atezolizumab  Maintenance 09/08/2023: Cycle 43 Day 1 of Atezolizumab  Maintenance  Interval History:  [name] 68 y.o. female with medical history significant for extensive stage small cell lung cancer who presents for a follow up visit. The patient's last visit was on 08/18/2023.  She presents today to start cycle 43 of maintenance atezolizumab . She is unaccompanied for this visit.   On exam today [name] reports she has been very busy in the interim since her last visit running around doing everything for my brother.  She notes that she has had swelling in her feet and they have been really puffy.  She notes that they are improved today.  Her ankles have been swollen.  She is taking her Lasix  20 mg p.o. daily and it does not appear to be controlling her ankles well.  She is doing her best to keep them elevated when possible.  She reports her energy levels are okay but that she is too busy to notice if they are running low.  She notes that she also tends to eat before going to bed which is what she attributes her recent weight gain to.  Otherwise she is tolerating treatment well though for the approximately 3 days before she gets treatment she has anticipatory symptoms and abdominal discomfort.  Otherwise she denies any fevers, chills, sweats, nausea, vomiting or diarrhea.  Full 10 point ROS is otherwise negative.  MEDICAL HISTORY:  Past Medical History:  Diagnosis Date   Allergic rhinitis    Anemia    Anxiety    Chicken pox    Chronic back pain    COPD (chronic obstructive pulmonary disease) (HCC)    Depression    DM (diabetes mellitus) (HCC)    Essential hypertension    GERD (gastroesophageal reflux disease)    Headache    migraines   History of gastritis    EGD 2015   History of home oxygen  therapy    2 liters at  [facility] last 6 months   Hyperlipidemia    Hypothyroidism    Migraines    Osteoarthritis    oa   Scoliosis    Small cell lung cancer (HCC)    Stage 4    SURGICAL HISTORY: Past Surgical History:  Procedure Laterality Date   APPENDECTOMY     1985   BIOPSY  07/24/2017   Procedure: BIOPSY;  Surgeon: [name], MD;  Location: [facility];  Service: Endoscopy;;   CARDIAC CATHETERIZATION N/A 10/31/2015   Procedure: Left Heart Cath and Coronary Angiography;  Surgeon: Alm LELON Clay, MD;  Location: [facility] INVASIVE CV LAB;  Service: Cardiovascular;  Laterality: N/A;   CARPAL TUNNEL RELEASE Left    CARPAL TUNNEL RELEASE Right    CATARACT EXTRACTION W/PHACO Left 02/07/2023   Procedure: CATARACT EXTRACTION PHACO AND INTRAOCULAR LENS PLACEMENT (IOC);  Surgeon: Juli Blunt, MD;  Location: [facility];  Service: Ophthalmology;  Laterality: Left;  CDE: 3.51   CATARACT EXTRACTION W/PHACO Right 02/21/2023   Procedure: CATARACT EXTRACTION PHACO AND INTRAOCULAR LENS PLACEMENT (IOC);  Surgeon: Juli Blunt, MD;  Location: [facility];  Service: Ophthalmology;  Laterality: Right;  CDE: 2.94   CHOLECYSTECTOMY  late 1980's   COLONOSCOPY WITH PROPOFOL  N/A 07/24/2017   Procedure: COLONOSCOPY WITH PROPOFOL ;  Surgeon: [name], MD;  Location: [facility];  Service: Endoscopy;  Laterality: N/A;   ESOPHAGOGASTRODUODENOSCOPY N/A 07/24/2017   Procedure: ESOPHAGOGASTRODUODENOSCOPY (EGD);  Surgeon: [name], MD;  Location: [facility];  Service: Endoscopy;  Laterality: N/A;   ESOPHAGOGASTRODUODENOSCOPY (EGD) WITH PROPOFOL  N/A 10/19/2020   Procedure: ESOPHAGOGASTRODUODENOSCOPY (EGD) WITH PROPOFOL ;  Surgeon: [name], MD;  Location: [facility];  Service: Endoscopy;  Laterality: N/A;   FINE NEEDLE ASPIRATION N/A 10/19/2020   Procedure: FINE NEEDLE ASPIRATION (FNA) LINEAR;  Surgeon: [name], MD;  Location: [facility];  Service: Endoscopy;  Laterality: N/A;   GALLBLADDER SURGERY  1991    HIP CLOSED REDUCTION Right 01/08/2016   Procedure: CLOSED MANIPULATION HIP;  Surgeon: Reyes Billing, MD;  Location: [facility];  Service: Orthopedics;  Laterality: Right;   HIP CLOSED REDUCTION Right 01/19/2016   Procedure: ATTEMPTED CLOSED REDUCTION RIGHT HIP;  Surgeon: Norleen Armor, MD;  Location: [facility];  Service: Orthopedics;  Laterality: Right;   HIP CLOSED REDUCTION Right 01/20/2016   Procedure: CLOSED REDUCTION RIGHT TOTAL HIP;  Surgeon: Donnice Car, MD;  Location: [facility];  Service: Orthopedics;  Laterality: Right;   HIP CLOSED REDUCTION Right 02/17/2016   Procedure: CLOSED REDUCTION RIGHT TOTAL HIP;  Surgeon: Redell Shoals, MD;  Location: [facility];  Service: Orthopedics;  Laterality: Right;   HIP CLOSED REDUCTION Right 02/28/2016   Procedure: CLOSED REDUCTION HIP;  Surgeon: Selinda Belvie Gosling, MD;  Location: [facility];  Service: Orthopedics;  Laterality: Right;   IR IMAGING GUIDED PORT INSERTION  11/01/2020   POLYPECTOMY  07/24/2017   Procedure: POLYPECTOMY;  Surgeon: [name], MD;  Location: [facility];  Service: Endoscopy;;   TONSILLECTOMY     TOTAL ABDOMINAL HYSTERECTOMY     1985, with 1 ovary removed and 2 nd ovary removed 2003   TOTAL HIP ARTHROPLASTY Right    Original surgery 2006 with revision 2010   TOTAL HIP REVISION Right 01/01/2016   Procedure: TOTAL HIP REVISION;  Surgeon: Donnice Car, MD;  Location: [facility];  Service: Orthopedics;  Laterality: Right;   TOTAL HIP REVISION Right 03/02/2016   Procedure: TOTAL HIP REVISION;  Surgeon: Car Donnice, MD;  Location: [facility];  Service: Orthopedics;  Laterality: Right;   TOTAL HIP REVISION Right 09/02/2016   Procedure: Right hip constrained liner- posterior;  Surgeon: Car Donnice, MD;  Location: [facility];  Service: Orthopedics;  Laterality: Right;   ULNAR NERVE TRANSPOSITION Right    UPPER ESOPHAGEAL ENDOSCOPIC ULTRASOUND (EUS) N/A 10/19/2020   Procedure: UPPER ESOPHAGEAL ENDOSCOPIC ULTRASOUND (EUS);  Surgeon: [name], MD;   Location: [facility];  Service: Endoscopy;  Laterality: N/A;  periduodenal lesion    SOCIAL HISTORY: Social History   Socioeconomic History   Marital status: Married    Spouse name: Not on file   Number of children: 2   Years of education: Not on file   Highest education level: 12th grade  Occupational History   Occupation: disabled   Occupation: disabled  Tobacco Use   Smoking status: Every Day    Current packs/day: 2.00    Average packs/day: 2.0 packs/day for 46.0 years (92.0 ttl pk-yrs)    Types: Cigarettes   Smokeless tobacco: Never   Tobacco comments:    2 packs of cigarettes smoked daily 12/13/21- declines smoking cessation  Vaping Use   Vaping status: Never Used  Substance and Sexual Activity   Alcohol use: No   Drug use: No   Sexual activity: Not Currently    Partners: Male  Other Topics Concern   Not on file  Social History Narrative   Right handed    Caffeine~ 2 cups per day    Lives at home with husband (strained relationship)   Primary caretaker for disabled brother who had aneurism   Daughter died 2018-07-05  Social Drivers of Corporate investment banker Strain: Low Risk  (07/31/2023)   Received from [facility]   Overall Financial Resource Strain (CARDIA)    How hard is it for you to pay for the very basics like food, housing, medical care, and heating?: Not very hard  Food Insecurity: No Food Insecurity (07/31/2023)   Received from [facility]   Hunger Vital Sign    Within the past 12 months, you worried that your food would run out before you got the money to buy more.: Never true    Within the past 12 months, the food you bought just didn't last and you didn't have money to get more.: Never true  Recent Concern: Food Insecurity - Food Insecurity Present (05/20/2023)   Hunger Vital Sign    Worried About Running Out of Food in the Last Year: Sometimes true    Ran Out of Food in the Last Year: Sometimes true  Transportation Needs: No  Transportation Needs (07/31/2023)   Received from [facility]   PRAPARE - Transportation    Lack of Transportation (Medical): No    Lack of Transportation (Non-Medical): No  Physical Activity: Inactive (05/20/2023)   Exercise Vital Sign    Days of Exercise per Week: 0 days    Minutes of Exercise per Session: 0 min  Stress: No Stress Concern Present (05/20/2023)   [employer] of Occupational Health - Occupational Stress Questionnaire    Feeling of Stress : Only a little  Recent Concern: Stress - Stress Concern Present (03/31/2023)   [employer] of Occupational Health - Occupational Stress Questionnaire    Feeling of Stress : Very much  Social Connections: Moderately Integrated (05/20/2023)   Social Connection and Isolation Panel    Frequency of Communication with Friends and Family: More than three times a week    Frequency of Social Gatherings with Friends and Family: More than three times a week    Attends Religious Services: 1 to 4 times per year    Active Member of [employer] or Organizations: No    Attends Banker Meetings: Never    Marital Status: Married  Catering manager Violence: Not At Risk (07/31/2023)   [facility]   Humiliation, Afraid, Rape, and Kick questionnaire    Within the last year, have you been afraid of your partner or ex-partner?: No    Within the last year, have you been humiliated or emotionally abused in other ways by your partner or ex-partner?: No    Within the last year, have you been kicked, hit, slapped, or otherwise physically hurt by your partner or ex-partner?: No    Within the last year, have you been raped or forced to have any kind of sexual activity by your partner or ex-partner?: No    FAMILY HISTORY: Family History  Problem Relation Age of Onset   COPD Mother    Heart disease Mother    Lung disease Father        Asbestosis   Heart attack Father    Heart disease Father    Cerebral aneurysm Brother     Aneurysm Brother        Brain   Drug abuse Daughter    Epilepsy Son    Alcohol abuse Son    Drug abuse Son    Arthritis Maternal Grandmother    Heart disease Maternal Grandmother    Asthma Maternal Grandfather    Cancer Maternal Grandfather  Arthritis Paternal Grandmother    Heart disease Paternal Grandmother    Stroke Paternal Grandmother    Early death Paternal Grandfather    Heart disease Paternal Grandfather     ALLERGIES:  is allergic to nisoldipine, metformin  and related, nsaids, pregabalin, wellbutrin  [bupropion ], aleve [naproxen sodium], penicillins, and sulfonamide derivatives.  MEDICATIONS:  Current Outpatient Medications  Medication Sig Dispense Refill   albuterol  (PROAIR  HFA) 108 (90 Base) MCG/ACT inhaler 2 puffs every 4 hours as needed only  if your can't catch your breath 18 g 5   ALPRAZolam  (XANAX ) 1 MG tablet TAKE 1 TABLET BY MOUTH THREE TIMES DAILY AS NEEDED FOR ANXIETY 90 tablet 2   ARIPiprazole  (ABILIFY ) 5 MG tablet Take 1 tablet (5 mg total) by mouth daily. 30 tablet 5   atorvastatin  (LIPITOR) 20 MG tablet TAKE 1 TABLET BY MOUTH AT BEDTIME 90 tablet 1   benzonatate  (TESSALON ) 100 MG capsule TAKE ONE CAPSULE BY MOUTH THREE TIMES DAILY 90 capsule 1   calcipotriene  (DOVONOX) 0.005 % cream Apply to affected area bid when on break from steroid cream 120 g 3   cyclobenzaprine  (FLEXERIL ) 10 MG tablet Take 10 mg by mouth 3 (three) times daily as needed.     dicyclomine  (BENTYL ) 10 MG capsule Take 1 capsule (10 mg total) by mouth every 8 (eight) hours as needed for spasms. Please keep your August appointment for further refills. Thank you 60 capsule 1   diphenoxylate -atropine  (LOMOTIL ) 2.5-0.025 MG tablet TAKE 1 TABLET BY MOUTH FOUR TIMES DAILY AS NEEDED FOR diarrhea OR loose stools 60 tablet 0   DULoxetine  (CYMBALTA ) 60 MG capsule TAKE TWO CAPSULES BY MOUTH EVERY DAY 180 capsule 1   FEROSUL 325 (65 Fe) MG tablet TAKE 1 TABLET BY MOUTH DAILY WITH BREAKFAST 30 tablet 3    fluconazole  (DIFLUCAN ) 150 MG tablet Take 150 mg by mouth once.     fluticasone  (FLONASE ) 50 MCG/ACT nasal spray INSTILL 2 SPRAYS IN EACH NOSTRIL EVERY DAY 16 g 6   furosemide  (LASIX ) 20 MG tablet TAKE 1 TABLET BY MOUTH TWICE DAILY, IF SWELLING IMPROVES CAN HOLD MEDICATION 90 tablet 0   HYDROcodone -acetaminophen  (NORCO) 10-325 MG tablet Take 1 tablet by mouth every 4 (four) hours as needed. To fill 08/29/23- 180 tablet 0   HYDROcodone -acetaminophen  (NORCO) 10-325 MG tablet Take 1 tablet by mouth every 4 (four) hours as needed. 180 tablet 0   hydrOXYzine  (ATARAX ) 10 MG tablet TAKE 1 TABLET BY MOUTH THREE TIMES DAILY AS NEEDED FOR ITCHING 30 tablet 2   levothyroxine  (SYNTHROID ) 137 MCG tablet Take 1 tablet (137 mcg total) by mouth daily before breakfast. 90 tablet 3   lidocaine -prilocaine  (EMLA ) cream Apply 1 Application topically as needed. 30 g 2   loratadine  (CLARITIN ) 10 MG tablet Take 10 mg by mouth daily.     meclizine  (ANTIVERT ) 12.5 MG tablet Take 1 tablet (12.5 mg total) by mouth 2 (two) times daily. 60 tablet 1   meloxicam  (MOBIC ) 15 MG tablet TAKE 1 TABLET BY MOUTH EVERY DAY 30 tablet 2   NARCAN  4 MG/0.1ML LIQD nasal spray kit SMARTSIG:Both Nares     nystatin  (MYCOSTATIN ) 100000 UNIT/ML suspension      OLANZapine  (ZYPREXA ) 10 MG tablet Take 1 tablet (10 mg total) by mouth at bedtime. 30 tablet 2   omeprazole  (PRILOSEC) 40 MG capsule TAKE ONE CAPSULE BY MOUTH TWICE DAILY 180 capsule 1   ondansetron  (ZOFRAN ) 8 MG tablet TAKE 1 TABLET BY MOUTH TWICE DAILY 60 tablet 1  permethrin  (ELIMITE ) 5 % cream      potassium chloride  SA (KLOR-CON  M) 20 MEQ tablet TAKE 1 TABLET BY MOUTH TWICE DAILY 60 tablet 2   prochlorperazine  (COMPAZINE ) 10 MG tablet Take 1 tablet (10 mg total) by mouth every 6 (six) hours as needed for nausea or vomiting. 30 tablet 0   Rimegepant Sulfate (NURTEC) 75 MG TBDP TAKE 1 TABLET BY MOUTH DAILY AS NEEDED FOR MIGRAINE, take WITHIN 15 MINUTES of initial symptoms 8 tablet 1    topiramate  (TOPAMAX ) 100 MG tablet TAKE 1 TABLET BY MOUTH AT BEDTIME 90 tablet 1   triamcinolone  ointment (KENALOG ) 0.5 % Apply 1 Application topically 3 (three) times daily. Apply thin-layer on affected areas, no more than 2 weeks consistently. 60 g 2   triamterene -hydrochlorothiazide  (MAXZIDE -25) 37.5-25 MG tablet Take 1 tablet by mouth daily.     umeclidinium-vilanterol (ANORO ELLIPTA ) 62.5-25 MCG/ACT AEPB Inhale 1 puff into the lungs daily. 60 each 11   No current facility-administered medications for this visit.    REVIEW OF SYSTEMS:   Constitutional: ( - ) fevers, ( - )  chills , ( - ) night sweats Eyes: ( - ) blurriness of vision, ( - ) double vision, ( - ) watery eyes Ears, nose, mouth, throat, and face: ( - ) mucositis, ( - ) sore throat Respiratory: ( - ) cough, ( -) dyspnea, ( - ) wheezes Cardiovascular: ( - ) palpitation, ( - ) chest discomfort, ( - ) lower extremity swelling Gastrointestinal:  ( +) nausea, ( - ) heartburn, ( - ) change in bowel habits Skin: ( - ) abnormal skin rashes Lymphatics: ( - ) new lymphadenopathy, ( - ) easy bruising Neurological: (+ ) numbness, ( - ) tingling, ( - ) new weaknesses Behavioral/Psych: ( - ) mood change, ( - ) new changes  All other systems were reviewed with the patient and are negative.  PHYSICAL EXAMINATION: ECOG PERFORMANCE STATUS: 1 - Symptomatic but completely ambulatory  Vitals:   09/08/23 1134  BP: 124/72  Pulse: 77  Resp: 16  Temp: (!) 97.5 F (36.4 C)  SpO2: 97%     Filed Weights   09/08/23 1134  Weight: 186 lb 4.8 oz (84.5 kg)      GENERAL: Well-appearing middle-age Caucasian female, alert, no distress and comfortable SKIN: skin color, texture, turgor are normal, no rashes or significant lesions EYES: conjunctiva are pink and non-injected, sclera clear LUNGS:  normal breathing effort. Diffuse wheezing hear on ausculation.  HEART: regular rate & rhythm and no murmurs. Mild bilateral lower extremity  edema Musculoskeletal: no cyanosis of digits and no clubbing  PSYCH: alert & oriented x 3, fluent speech NEURO: no focal motor/sensory deficits  LABORATORY DATA:  I have reviewed the data as listed    Latest Ref Rng & Units 09/08/2023   10:51 AM 08/18/2023   10:15 AM 07/28/2023   12:37 PM  CBC  WBC 4.0 - 10.5 K/uL 7.1  7.5  7.2   Hemoglobin 12.0 - 15.0 g/dL 86.6  86.4  86.6   Hematocrit 36.0 - 46.0 % 39.8  41.2  40.6   Platelets 150 - 400 K/uL 154  181  157        Latest Ref Rng & Units 09/08/2023   10:51 AM 08/18/2023   10:15 AM 07/28/2023   12:37 PM  CMP  Glucose 70 - 99 mg/dL 92  96  87   BUN 8 - 23 mg/dL 16  13  13  Creatinine 0.44 - 1.00 mg/dL 8.70  8.77  9.06   Sodium 135 - 145 mmol/L 135  139  142   Potassium 3.5 - 5.1 mmol/L 3.6  3.9  4.2   Chloride 98 - 111 mmol/L 100  105  113   CO2 22 - 32 mmol/L 30  28  24    Calcium  8.9 - 10.3 mg/dL 8.4  8.4  8.3   Total Protein 6.5 - 8.1 g/dL 5.9  6.1  5.9   Total Bilirubin 0.0 - 1.2 mg/dL 0.4  0.4  0.4   Alkaline Phos 38 - 126 U/L 69  85  76   AST 15 - 41 U/L 15  13  11    ALT 0 - 44 U/L 10  8  6      No results found for: MPROTEIN Lab Results  Component Value Date   KPAFRELGTCHN 0.75 06/30/2014   LAMBDASER 3.78 (H) 06/30/2014   KAPLAMBRATIO 0.20 (L) 06/30/2014     RADIOGRAPHIC STUDIES: No results found.    ASSESSMENT & PLAN [name] 68 y.o. female with medical history significant for extensive stage small cell lung cancer who presents for a follow up visit.   After review of the labs, review of the records, and discussion with the patient the patients findings are most consistent with extensive stage small cell lung cancer with metastasis from the right lower lobe to the lymph nodes of the abdomen.  At this time we will pursue triple therapy with carboplatin , etoposide , and atezolizumab .  After 4 cycles we will convert to maintenance atezolizumab  alone.  We previously discussed the risks and benefits of this  therapy and the patient was in agreement to proceed with this treatment.  The treatment of choice consist of carboplatin , etoposide , and atezolizumab .  The regimen consists of carboplatin  AUC of 5 IV on day 1, etoposide  100 mg per metered squared IV on day 1, 2, and 3 and atezolizumab  1200 mg on day 1.  This continues for 21-day cycles.  After 4 cycles the patient proceeds with atezolizumab  maintenance therapy alone.    # Small Cell Lung Cancer, Extensive Stage -- MRI of the brain shows no evidence of intracranial spread --Findings are currently consistent with metastatic small cell lung cancer with metastatic spread to the lymph nodes of the abdomen -- Started carboplatin , etoposide , and atezolizumab  on 11/13/2020. Transitioned to maintenance Atezolizumab  on 02/22/2021.  Plan: --today is Cycle 42 Day 1 of Atezolizumab  maintenance.  --labs today were reviewed and adequate for treatment. WBC 7.1, hemoglobin 13.3, MCV 90.9, platelets 154, creatinine and LFTs in range -- Most recent CT CAP from 07/07/2023 which shows no evidence of recurrence. Repeat scan due in  Sept 2025.   --Proceed with treatment without any dose modifications.  --RTC in 3 weeks for a follow up before Cycle 43  # Early Cirrhosis --noted on MR abdomen from 05/22/2022. US  with elastography performed on 06/18/2022. --continue to follow with GI.   #Right renal lesion: --Noted to be benign on most recent MRI of the abdomen.  #Diarrhea-improved --stool sent for C. Diff and GI pathology panel, all negative --patient following with GI and found to have pancreatic insufficiency and started on creon  with improvement of symptoms.   #Nausea: --Symptoms improved with zofran  and olanzapine .  --Added IV zofran  to infusion premeds for better symptom control.   #Hypokalemia:  --currently taking potassium chloride  20 meq BID.   --potassium level is 3.6 today. Continue PO supplementation.   #Lower extremity edema --  Currently takes  lasix  20 mg 1-2 times a day. --Improved with compression stockings.   #Neuropathy involving fingers/feet: --Patient currently takes Cymbalta    #Supportive Care -- chemotherapy education complete -- port placed -- zofran  8mg  q8H PRN and compazine  10mg  PO q6H for nausea -- EMLA  cream for port  Orders Placed This Encounter  Procedures   CT CHEST ABDOMEN PELVIS W CONTRAST    Standing Status:   Future    Expected Date:   10/09/2023    Expiration Date:   09/07/2024    If indicated for the ordered procedure, I authorize the administration of contrast media per Radiology protocol:   Yes    Does the patient have a contrast media/X-ray dye allergy ?:   No    Preferred imaging location?:   [facility]    If indicated for the ordered procedure, I authorize the administration of oral contrast media per Radiology protocol:   Yes   All questions were answered. The patient knows to call the clinic with any problems, questions or concerns.  I have spent a total of 30 minutes minutes of face-to-face and non-face-to-face time, preparing to see the patient, performing a medically appropriate examination, counseling and educating the patient, ordering medications/tests, documenting clinical information in the electronic health record, and care coordination.   Norleen IVAR Kidney, MD Department of Hematology/Oncology [facility] at [facility] Phone: [phone] Pager: [phone] Email: [email or web address]   09/08/2023 3:13 PM

## 2023-09-08 ENCOUNTER — Inpatient Hospital Stay

## 2023-09-08 ENCOUNTER — Other Ambulatory Visit

## 2023-09-08 ENCOUNTER — Ambulatory Visit (INDEPENDENT_AMBULATORY_CARE_PROVIDER_SITE_OTHER): Admitting: Gastroenterology

## 2023-09-08 ENCOUNTER — Inpatient Hospital Stay: Attending: Hematology and Oncology | Admitting: Hematology and Oncology

## 2023-09-08 ENCOUNTER — Encounter: Payer: Self-pay | Admitting: Gastroenterology

## 2023-09-08 VITALS — BP 124/72 | HR 77 | Temp 97.5°F | Resp 16 | Ht 62.0 in | Wt 186.3 lb

## 2023-09-08 VITALS — BP 128/80 | HR 86 | Ht 62.99 in | Wt 187.2 lb

## 2023-09-08 VITALS — BP 132/78 | HR 72 | Resp 16

## 2023-09-08 DIAGNOSIS — F419 Anxiety disorder, unspecified: Secondary | ICD-10-CM | POA: Diagnosis not present

## 2023-09-08 DIAGNOSIS — Z95828 Presence of other vascular implants and grafts: Secondary | ICD-10-CM | POA: Diagnosis not present

## 2023-09-08 DIAGNOSIS — Z860100 Personal history of colon polyps, unspecified: Secondary | ICD-10-CM

## 2023-09-08 DIAGNOSIS — Z90710 Acquired absence of both cervix and uterus: Secondary | ICD-10-CM | POA: Insufficient documentation

## 2023-09-08 DIAGNOSIS — Z882 Allergy status to sulfonamides status: Secondary | ICD-10-CM | POA: Diagnosis not present

## 2023-09-08 DIAGNOSIS — F17210 Nicotine dependence, cigarettes, uncomplicated: Secondary | ICD-10-CM | POA: Insufficient documentation

## 2023-09-08 DIAGNOSIS — G8929 Other chronic pain: Secondary | ICD-10-CM | POA: Diagnosis not present

## 2023-09-08 DIAGNOSIS — M1990 Unspecified osteoarthritis, unspecified site: Secondary | ICD-10-CM | POA: Diagnosis not present

## 2023-09-08 DIAGNOSIS — R932 Abnormal findings on diagnostic imaging of liver and biliary tract: Secondary | ICD-10-CM | POA: Diagnosis not present

## 2023-09-08 DIAGNOSIS — Z5112 Encounter for antineoplastic immunotherapy: Secondary | ICD-10-CM | POA: Insufficient documentation

## 2023-09-08 DIAGNOSIS — E039 Hypothyroidism, unspecified: Secondary | ICD-10-CM | POA: Diagnosis not present

## 2023-09-08 DIAGNOSIS — Z8249 Family history of ischemic heart disease and other diseases of the circulatory system: Secondary | ICD-10-CM | POA: Insufficient documentation

## 2023-09-08 DIAGNOSIS — Z809 Family history of malignant neoplasm, unspecified: Secondary | ICD-10-CM | POA: Insufficient documentation

## 2023-09-08 DIAGNOSIS — I10 Essential (primary) hypertension: Secondary | ICD-10-CM | POA: Insufficient documentation

## 2023-09-08 DIAGNOSIS — Z9049 Acquired absence of other specified parts of digestive tract: Secondary | ICD-10-CM | POA: Insufficient documentation

## 2023-09-08 DIAGNOSIS — M419 Scoliosis, unspecified: Secondary | ICD-10-CM | POA: Diagnosis not present

## 2023-09-08 DIAGNOSIS — Z860101 Personal history of adenomatous and serrated colon polyps: Secondary | ICD-10-CM | POA: Diagnosis not present

## 2023-09-08 DIAGNOSIS — Z8261 Family history of arthritis: Secondary | ICD-10-CM | POA: Insufficient documentation

## 2023-09-08 DIAGNOSIS — Z79890 Hormone replacement therapy (postmenopausal): Secondary | ICD-10-CM | POA: Diagnosis not present

## 2023-09-08 DIAGNOSIS — Z8719 Personal history of other diseases of the digestive system: Secondary | ICD-10-CM | POA: Insufficient documentation

## 2023-09-08 DIAGNOSIS — Z823 Family history of stroke: Secondary | ICD-10-CM | POA: Insufficient documentation

## 2023-09-08 DIAGNOSIS — Z820 Family history of epilepsy and other diseases of the nervous system: Secondary | ICD-10-CM | POA: Insufficient documentation

## 2023-09-08 DIAGNOSIS — Z90721 Acquired absence of ovaries, unilateral: Secondary | ICD-10-CM | POA: Insufficient documentation

## 2023-09-08 DIAGNOSIS — J449 Chronic obstructive pulmonary disease, unspecified: Secondary | ICD-10-CM | POA: Diagnosis not present

## 2023-09-08 DIAGNOSIS — Z811 Family history of alcohol abuse and dependence: Secondary | ICD-10-CM | POA: Insufficient documentation

## 2023-09-08 DIAGNOSIS — Z79899 Other long term (current) drug therapy: Secondary | ICD-10-CM

## 2023-09-08 DIAGNOSIS — E785 Hyperlipidemia, unspecified: Secondary | ICD-10-CM | POA: Diagnosis not present

## 2023-09-08 DIAGNOSIS — Z888 Allergy status to other drugs, medicaments and biological substances status: Secondary | ICD-10-CM | POA: Diagnosis not present

## 2023-09-08 DIAGNOSIS — Z886 Allergy status to analgesic agent status: Secondary | ICD-10-CM | POA: Diagnosis not present

## 2023-09-08 DIAGNOSIS — Z825 Family history of asthma and other chronic lower respiratory diseases: Secondary | ICD-10-CM | POA: Insufficient documentation

## 2023-09-08 DIAGNOSIS — Z880 Allergy status to penicillin: Secondary | ICD-10-CM | POA: Diagnosis not present

## 2023-09-08 DIAGNOSIS — R1310 Dysphagia, unspecified: Secondary | ICD-10-CM

## 2023-09-08 DIAGNOSIS — C3431 Malignant neoplasm of lower lobe, right bronchus or lung: Secondary | ICD-10-CM | POA: Insufficient documentation

## 2023-09-08 DIAGNOSIS — M25471 Effusion, right ankle: Secondary | ICD-10-CM | POA: Insufficient documentation

## 2023-09-08 DIAGNOSIS — E876 Hypokalemia: Secondary | ICD-10-CM | POA: Diagnosis not present

## 2023-09-08 DIAGNOSIS — K219 Gastro-esophageal reflux disease without esophagitis: Secondary | ICD-10-CM

## 2023-09-08 DIAGNOSIS — M25472 Effusion, left ankle: Secondary | ICD-10-CM | POA: Diagnosis not present

## 2023-09-08 DIAGNOSIS — R7881 Bacteremia: Secondary | ICD-10-CM | POA: Diagnosis not present

## 2023-09-08 DIAGNOSIS — K7460 Unspecified cirrhosis of liver: Secondary | ICD-10-CM | POA: Insufficient documentation

## 2023-09-08 DIAGNOSIS — Z814 Family history of other substance abuse and dependence: Secondary | ICD-10-CM | POA: Insufficient documentation

## 2023-09-08 DIAGNOSIS — F32A Depression, unspecified: Secondary | ICD-10-CM | POA: Diagnosis not present

## 2023-09-08 LAB — CMP (CANCER CENTER ONLY)
ALT: 10 U/L (ref 0–44)
AST: 15 U/L (ref 15–41)
Albumin: 3.5 g/dL (ref 3.5–5.0)
Alkaline Phosphatase: 69 U/L (ref 38–126)
Anion gap: 5 (ref 5–15)
BUN: 16 mg/dL (ref 8–23)
CO2: 30 mmol/L (ref 22–32)
Calcium: 8.4 mg/dL — ABNORMAL LOW (ref 8.9–10.3)
Chloride: 100 mmol/L (ref 98–111)
Creatinine: 1.29 mg/dL — ABNORMAL HIGH (ref 0.44–1.00)
GFR, Estimated: 45 mL/min — ABNORMAL LOW (ref >60)
Glucose, Bld: 92 mg/dL (ref 70–99)
Potassium: 3.6 mmol/L (ref 3.5–5.1)
Sodium: 135 mmol/L (ref 135–145)
Total Bilirubin: 0.4 mg/dL (ref 0.0–1.2)
Total Protein: 5.9 g/dL — ABNORMAL LOW (ref 6.5–8.1)

## 2023-09-08 LAB — CBC WITH DIFFERENTIAL (CANCER CENTER ONLY)
Abs Immature Granulocytes: 0.04 K/uL (ref 0.00–0.07)
Basophils Absolute: 0 K/uL (ref 0.0–0.1)
Basophils Relative: 0 %
Eosinophils Absolute: 0.3 K/uL (ref 0.0–0.5)
Eosinophils Relative: 4 %
HCT: 39.8 % (ref 36.0–46.0)
Hemoglobin: 13.3 g/dL (ref 12.0–15.0)
Immature Granulocytes: 1 %
Lymphocytes Relative: 27 %
Lymphs Abs: 1.9 K/uL (ref 0.7–4.0)
MCH: 30.4 pg (ref 26.0–34.0)
MCHC: 33.4 g/dL (ref 30.0–36.0)
MCV: 90.9 fL (ref 80.0–100.0)
Monocytes Absolute: 0.5 K/uL (ref 0.1–1.0)
Monocytes Relative: 7 %
Neutro Abs: 4.4 K/uL (ref 1.7–7.7)
Neutrophils Relative %: 61 %
Platelet Count: 154 K/uL (ref 150–400)
RBC: 4.38 MIL/uL (ref 3.87–5.11)
RDW: 14.6 % (ref 11.5–15.5)
WBC Count: 7.1 K/uL (ref 4.0–10.5)
nRBC: 0 % (ref 0.0–0.2)

## 2023-09-08 LAB — TSH: TSH: 0.226 u[IU]/mL — ABNORMAL LOW (ref 0.350–4.500)

## 2023-09-08 MED ORDER — OMEPRAZOLE 40 MG PO CPDR: 40.0000 mg | DELAYED_RELEASE_CAPSULE | Freq: Two times a day (BID) | ORAL | 3 refills | Status: AC

## 2023-09-08 MED ORDER — ONDANSETRON HCL 4 MG/2ML IJ SOLN
8.0000 mg | Freq: Once | INTRAMUSCULAR | Status: AC
  Administered 2023-09-08 (×2): 8 mg via INTRAVENOUS
  Filled 2023-09-08: qty 4

## 2023-09-08 MED ORDER — SODIUM CHLORIDE 0.9% FLUSH
10.0000 mL | Freq: Once | INTRAVENOUS | Status: AC
Start: 2023-09-08 — End: 2023-09-08
  Administered 2023-09-08 (×2): 10 mL

## 2023-09-08 MED ORDER — SODIUM CHLORIDE 0.9 % IV SOLN: Freq: Once | INTRAVENOUS | Status: AC

## 2023-09-08 MED ORDER — SODIUM CHLORIDE 0.9 % IV SOLN
1200.0000 mg | Freq: Once | INTRAVENOUS | Status: AC
  Administered 2023-09-08 (×2): 1200 mg via INTRAVENOUS
  Filled 2023-09-08: qty 20

## 2023-09-08 NOTE — Progress Notes (Signed)
 HPI :  68 year old female here for follow-up visit.  Recall she has a history of COPD, current tobacco use, history of metastatic small cell lung cancer diagnosed 2002, currently on chemo and immunotherapy, GERD, history of colon polyps.  She was seen most recently in the office in February by Elida Nyle Sharps.  At that time she was complaining of dysphagia in the setting of reflux.  She had a modified barium swallow performed with speech pathology, full report as outlined below, no significant abnormalities noted.  She states over the past several weeks her dysphagia has seemingly resolved.  She states before she was feeling solids and liquids get stuck in her throat with swallows, for the past 6 weeks she is really not noticed it almost.  She is not limited in her eating in any way and is feeling well in this regard.  She does take omeprazole  40 mg twice daily.  She has been on this for some time to control her reflux symptoms.  She states that she only takes it once a day she will have breakthrough heartburn and has been on this dosing to control symptoms for some time.  She did have an EUS/EGD with Dr. Teressa in 2022, no history of Barrett's on that exam.  She remains on chemotherapy and immunotherapy for history of metastatic small cell lung cancer.  She states she is in remission, had a CT scan chest abdomen pelvis done in June and there is no active disease that is noted.  Her therapy is indefinite at this point in time.  She otherwise has had some loose stools at baseline and takes Bentyl  twice daily to control her bowels.  She states this works really well for her and she is happy with the result.  Denies any problems her bowels.   Recall prior abdominal CT 03/2022 and MRI 04/2022 showed possible evidence of cirrhosis.  Subsequent abdominal ultrasound with elastography which showed a median KpA of 2.4 which indicated low probability of advanced liver disease/cirrhosis.  Serologic workup  for liver disease negative to date. A liver biopsy was considered to verify if she has cirrhosis but she did not wish to pursue any invasive evaluation.  Surveillance chest/abdominal/pelvic CT has shown a normal liver.  Liver enzymes are normal, platelet count is normal.   She continues to smoke cigarettes 2 packs daily.    GI PROCEDURES:   EUS by Dr. Teressa (PET scan showed periduodenal mass and a lung mass 10/19/2020: 1.7 cm mas involving the outer wall of the second duodenum. Malignant cells consistent with small cell undifferentiated carcinoma    EGD 07/24/2017 showed evidence of previous peptic ulcer disease in the prepyloric region, mild gastritis was negative for H. Pylori.    A colonoscopy was done on the same date which showed a 3mm tubular adenomatous polyp removed from the ascending colon and diverticulosis to the left colon. Recall colonoscopy 5 years.   Modified barium swallow 03/2023 Clinical Impression: Clinical Impression: Patient presents with minimal pharyngeal dysphagia resulting in consistent mild laryngeal penetration of thin liquid when consuming larger than a tiny sip.  Trace silent aspiration noted once as barium peaked just below the vocal cords.  Cued coughing cleared all penetrates and trace aspirates.  Laryngeal closure timing and adequacy slightly impaired.  Very small single sips were tolerated without penetration.  Chin tuck posture was not helpful as it continued to allow penetration.   Swallow with solid, pure and honey thick liquid was normal.  Patient  notably tossed her head back to aid transit of barium tablet with thin barium resulting in tablet spilling to vallecular space and deep penetration of thin liquid.  Advised patient consider taking meds with Ensure, tomato juice, slightly thicker liquid or applesauce.  Given patient reports discomfort when coughing and choking during meals advised that she monitor her when her dysphagia symptoms are occurring and take  copious notes to provide detailed information to her physician about this issue.  SLP did not observe slight aspiration until following the MBS therefore also recommended patient consider reclining in chair approximately 45 degrees with thin liquids to see if this would retain boluses in posterior pharynx to prevent aspiration.  Further recommend consider consuming slightly thicker liquids during her meals and saving her thin water  for between her meals if improves comfort.  Endorsed importance however of patient maintaining her hydration with understanding of goal to maximize her comfort with intake.  Thank you so much for this consult of this most pleasant patient.    CT C/A/P - 07/07/23: ABDOMEN/PELVIS: The liver is normal. Gallbladder surgically absent. The spleen is normal. Pancreas is normal. The right adrenal is normal. Stable left adrenal adenoma noted. Small right renal cyst is seen. There is mild left renal cortical scarring. The abdominal aorta is normal in caliber. Scattered atherosclerotic changes are present. The urinary bladder is normal. The uterus is surgically absent. There is colonic diverticulosis. Large and small bowel loops are otherwise within normal limits. There is no free fluid or lymphadenopathy.  IMPRESSION:   No convincing evidence for malignancy within the chest, abdomen, or pelvis.   Past Medical History:  Diagnosis Date   Allergic rhinitis    Anemia    Anxiety    Chicken pox    Chronic back pain    COPD (chronic obstructive pulmonary disease) (HCC)    Depression    DM (diabetes mellitus) (HCC)    Essential hypertension    GERD (gastroesophageal reflux disease)    Headache    migraines   History of gastritis    EGD 2015   History of home oxygen  therapy    2 liters at hs last 6 months   Hyperlipidemia    Hypothyroidism    Migraines    Osteoarthritis    oa   Scoliosis    Small cell lung cancer (HCC)    Stage 4     Past Surgical History:   Procedure Laterality Date   APPENDECTOMY     1985   BIOPSY  07/24/2017   Procedure: BIOPSY;  Surgeon: [name], MD;  Location: [facility];  Service: Endoscopy;;   CARDIAC CATHETERIZATION N/A 10/31/2015   Procedure: Left Heart Cath and Coronary Angiography;  Surgeon: Alm LELON Clay, MD;  Location: [facility] INVASIVE CV LAB;  Service: Cardiovascular;  Laterality: N/A;   CARPAL TUNNEL RELEASE Left    CARPAL TUNNEL RELEASE Right    CATARACT EXTRACTION W/PHACO Left 02/07/2023   Procedure: CATARACT EXTRACTION PHACO AND INTRAOCULAR LENS PLACEMENT (IOC);  Surgeon: Juli Blunt, MD;  Location: [facility];  Service: Ophthalmology;  Laterality: Left;  CDE: 3.51   CATARACT EXTRACTION W/PHACO Right 02/21/2023   Procedure: CATARACT EXTRACTION PHACO AND INTRAOCULAR LENS PLACEMENT (IOC);  Surgeon: Juli Blunt, MD;  Location: [facility];  Service: Ophthalmology;  Laterality: Right;  CDE: 2.94   CHOLECYSTECTOMY  late 1980's   COLONOSCOPY WITH PROPOFOL  N/A 07/24/2017   Procedure: COLONOSCOPY WITH PROPOFOL ;  Surgeon: [name], MD;  Location: [facility];  Service:  Endoscopy;  Laterality: N/A;   ESOPHAGOGASTRODUODENOSCOPY N/A 07/24/2017   Procedure: ESOPHAGOGASTRODUODENOSCOPY (EGD);  Surgeon: [name], MD;  Location: [facility];  Service: Endoscopy;  Laterality: N/A;   ESOPHAGOGASTRODUODENOSCOPY (EGD) WITH PROPOFOL  N/A 10/19/2020   Procedure: ESOPHAGOGASTRODUODENOSCOPY (EGD) WITH PROPOFOL ;  Surgeon: [name], MD;  Location: [facility];  Service: Endoscopy;  Laterality: N/A;   FINE NEEDLE ASPIRATION N/A 10/19/2020   Procedure: FINE NEEDLE ASPIRATION (FNA) LINEAR;  Surgeon: [name], MD;  Location: [facility];  Service: Endoscopy;  Laterality: N/A;   GALLBLADDER SURGERY  1991   HIP CLOSED REDUCTION Right 01/08/2016   Procedure: CLOSED MANIPULATION HIP;  Surgeon: Reyes Billing, MD;  Location: [facility];  Service: Orthopedics;  Laterality: Right;   HIP CLOSED REDUCTION Right  01/19/2016   Procedure: ATTEMPTED CLOSED REDUCTION RIGHT HIP;  Surgeon: Norleen Armor, MD;  Location: [facility];  Service: Orthopedics;  Laterality: Right;   HIP CLOSED REDUCTION Right 01/20/2016   Procedure: CLOSED REDUCTION RIGHT TOTAL HIP;  Surgeon: Donnice Car, MD;  Location: [facility];  Service: Orthopedics;  Laterality: Right;   HIP CLOSED REDUCTION Right 02/17/2016   Procedure: CLOSED REDUCTION RIGHT TOTAL HIP;  Surgeon: Redell Shoals, MD;  Location: [facility];  Service: Orthopedics;  Laterality: Right;   HIP CLOSED REDUCTION Right 02/28/2016   Procedure: CLOSED REDUCTION HIP;  Surgeon: Selinda Belvie Gosling, MD;  Location: [facility];  Service: Orthopedics;  Laterality: Right;   IR IMAGING GUIDED PORT INSERTION  11/01/2020   POLYPECTOMY  07/24/2017   Procedure: POLYPECTOMY;  Surgeon: [name], MD;  Location: [facility];  Service: Endoscopy;;   TONSILLECTOMY     TOTAL ABDOMINAL HYSTERECTOMY     1985, with 1 ovary removed and 2 nd ovary removed 2003   TOTAL HIP ARTHROPLASTY Right    Original surgery 2006 with revision 2010   TOTAL HIP REVISION Right 01/01/2016   Procedure: TOTAL HIP REVISION;  Surgeon: Donnice Car, MD;  Location: [facility];  Service: Orthopedics;  Laterality: Right;   TOTAL HIP REVISION Right 03/02/2016   Procedure: TOTAL HIP REVISION;  Surgeon: Car Donnice, MD;  Location: [facility];  Service: Orthopedics;  Laterality: Right;   TOTAL HIP REVISION Right 09/02/2016   Procedure: Right hip constrained liner- posterior;  Surgeon: Car Donnice, MD;  Location: [facility];  Service: Orthopedics;  Laterality: Right;   ULNAR NERVE TRANSPOSITION Right    UPPER ESOPHAGEAL ENDOSCOPIC ULTRASOUND (EUS) N/A 10/19/2020   Procedure: UPPER ESOPHAGEAL ENDOSCOPIC ULTRASOUND (EUS);  Surgeon: [name], MD;  Location: [facility];  Service: Endoscopy;  Laterality: N/A;  periduodenal lesion   Family History  Problem Relation Age of Onset   COPD Mother    Heart disease Mother    Lung disease Father         Asbestosis   Heart attack Father    Heart disease Father    Cerebral aneurysm Brother    Aneurysm Brother        Brain   Drug abuse Daughter    Epilepsy Son    Alcohol abuse Son    Drug abuse Son    Arthritis Maternal Grandmother    Heart disease Maternal Grandmother    Asthma Maternal Grandfather    Cancer Maternal Grandfather    Arthritis Paternal Grandmother    Heart disease Paternal Grandmother    Stroke Paternal Grandmother    Early death Paternal Grandfather    Heart disease Paternal Grandfather    Social History   Tobacco Use  Smoking status: Every Day    Current packs/day: 2.00    Average packs/day: 2.0 packs/day for 46.0 years (92.0 ttl pk-yrs)    Types: Cigarettes   Smokeless tobacco: Never   Tobacco comments:    2 packs of cigarettes smoked daily 12/13/21- declines smoking cessation  Vaping Use   Vaping status: Never Used  Substance Use Topics   Alcohol use: No   Drug use: No   Current Outpatient Medications  Medication Sig Dispense Refill   albuterol  (PROAIR  HFA) 108 (90 Base) MCG/ACT inhaler 2 puffs every 4 hours as needed only  if your can't catch your breath 18 g 5   ALPRAZolam  (XANAX ) 1 MG tablet TAKE 1 TABLET BY MOUTH THREE TIMES DAILY AS NEEDED FOR ANXIETY 90 tablet 2   ARIPiprazole  (ABILIFY ) 5 MG tablet Take 1 tablet (5 mg total) by mouth daily. 30 tablet 5   atorvastatin  (LIPITOR) 20 MG tablet TAKE 1 TABLET BY MOUTH AT BEDTIME 90 tablet 1   benzonatate  (TESSALON ) 100 MG capsule TAKE ONE CAPSULE BY MOUTH THREE TIMES DAILY 90 capsule 1   calcipotriene  (DOVONOX) 0.005 % cream Apply to affected area bid when on break from steroid cream 120 g 3   cyclobenzaprine  (FLEXERIL ) 10 MG tablet Take 10 mg by mouth 3 (three) times daily as needed.     dicyclomine  (BENTYL ) 10 MG capsule Take 1 capsule (10 mg total) by mouth every 8 (eight) hours as needed for spasms. Please keep your August appointment for further refills. Thank you 60 capsule 1    diphenoxylate -atropine  (LOMOTIL ) 2.5-0.025 MG tablet TAKE 1 TABLET BY MOUTH FOUR TIMES DAILY AS NEEDED FOR diarrhea OR loose stools 60 tablet 0   DULoxetine  (CYMBALTA ) 60 MG capsule TAKE TWO CAPSULES BY MOUTH EVERY DAY 180 capsule 1   FEROSUL 325 (65 Fe) MG tablet TAKE 1 TABLET BY MOUTH DAILY WITH BREAKFAST 30 tablet 3   fluconazole  (DIFLUCAN ) 150 MG tablet Take 150 mg by mouth once.     fluticasone  (FLONASE ) 50 MCG/ACT nasal spray INSTILL 2 SPRAYS IN EACH NOSTRIL EVERY DAY 16 g 6   furosemide  (LASIX ) 20 MG tablet TAKE 1 TABLET BY MOUTH TWICE DAILY, IF SWELLING IMPROVES CAN HOLD MEDICATION 90 tablet 0   HYDROcodone -acetaminophen  (NORCO) 10-325 MG tablet Take 1 tablet by mouth every 4 (four) hours as needed. To fill 08/29/23- 180 tablet 0   HYDROcodone -acetaminophen  (NORCO) 10-325 MG tablet Take 1 tablet by mouth every 4 (four) hours as needed. 180 tablet 0   hydrOXYzine  (ATARAX ) 10 MG tablet TAKE 1 TABLET BY MOUTH THREE TIMES DAILY AS NEEDED FOR ITCHING 30 tablet 2   levothyroxine  (SYNTHROID ) 137 MCG tablet Take 1 tablet (137 mcg total) by mouth daily before breakfast. 90 tablet 3   lidocaine -prilocaine  (EMLA ) cream Apply 1 Application topically as needed. 30 g 2   loratadine  (CLARITIN ) 10 MG tablet Take 10 mg by mouth daily.     meclizine  (ANTIVERT ) 12.5 MG tablet Take 1 tablet (12.5 mg total) by mouth 2 (two) times daily. 60 tablet 1   meloxicam  (MOBIC ) 15 MG tablet TAKE 1 TABLET BY MOUTH EVERY DAY 30 tablet 2   NARCAN  4 MG/0.1ML LIQD nasal spray kit SMARTSIG:Both Nares     nystatin  (MYCOSTATIN ) 100000 UNIT/ML suspension      OLANZapine  (ZYPREXA ) 10 MG tablet Take 1 tablet (10 mg total) by mouth at bedtime. 30 tablet 2   omeprazole  (PRILOSEC) 40 MG capsule TAKE ONE CAPSULE BY MOUTH TWICE DAILY 180 capsule 1  ondansetron  (ZOFRAN ) 8 MG tablet TAKE 1 TABLET BY MOUTH TWICE DAILY 60 tablet 1   permethrin  (ELIMITE ) 5 % cream      potassium chloride  SA (KLOR-CON  M) 20 MEQ tablet TAKE 1 TABLET BY MOUTH  TWICE DAILY 60 tablet 2   prochlorperazine  (COMPAZINE ) 10 MG tablet Take 1 tablet (10 mg total) by mouth every 6 (six) hours as needed for nausea or vomiting. 30 tablet 0   Rimegepant Sulfate (NURTEC) 75 MG TBDP TAKE 1 TABLET BY MOUTH DAILY AS NEEDED FOR MIGRAINE, take WITHIN 15 MINUTES of initial symptoms 8 tablet 1   topiramate  (TOPAMAX ) 100 MG tablet TAKE 1 TABLET BY MOUTH AT BEDTIME 90 tablet 1   triamcinolone  ointment (KENALOG ) 0.5 % Apply 1 Application topically 3 (three) times daily. Apply thin-layer on affected areas, no more than 2 weeks consistently. 60 g 2   triamterene -hydrochlorothiazide  (MAXZIDE -25) 37.5-25 MG tablet Take 1 tablet by mouth daily.     umeclidinium-vilanterol (ANORO ELLIPTA ) 62.5-25 MCG/ACT AEPB Inhale 1 puff into the lungs daily. 60 each 11   No current facility-administered medications for this visit.   Allergies  Allergen Reactions   Nisoldipine Other (See Comments)   Metformin  And Related Diarrhea   Nsaids Diarrhea   Pregabalin Other (See Comments)    pregabalin   Wellbutrin  [Bupropion ] Other (See Comments)    Makes her too sleepy    Aleve [Naproxen Sodium] Other (See Comments)    Headache    Penicillins Nausea Only and Other (See Comments)    GI upset    Sulfonamide Derivatives Hives     Review of Systems: All systems reviewed and negative except where noted in HPI.   Lab Results  Component Value Date   WBC 7.1 09/08/2023   HGB 13.3 09/08/2023   HCT 39.8 09/08/2023   MCV 90.9 09/08/2023   PLT 154 09/08/2023    Lab Results  Component Value Date   NA 135 09/08/2023   CL 100 09/08/2023   K 3.6 09/08/2023   CO2 30 09/08/2023   BUN 16 09/08/2023   CREATININE 1.29 (H) 09/08/2023   GFRNONAA 45 (L) 09/08/2023   CALCIUM  8.4 (L) 09/08/2023   PHOS 3.0 12/21/2020   ALBUMIN  3.5 09/08/2023   GLUCOSE 92 09/08/2023    Lab Results  Component Value Date   ALT 10 09/08/2023   AST 15 09/08/2023   ALKPHOS 69 09/08/2023   BILITOT 0.4  09/08/2023    Physical Exam: BP 128/80 (BP Location: Right Arm, Patient Position: Sitting, Cuff Size: Normal)   Pulse 86   Ht 5' 2.99 (1.6 m) Comment: height without shoes  Wt 187 lb 4 oz (84.9 kg)   BMI 33.18 kg/m  Constitutional: Pleasant,well-developed, female in no acute distress. Neurological: Alert and oriented to person place and time. Psychiatric: Normal mood and affect. Behavior is normal.   ASSESSMENT: 68 y.o. female here for assessment of the following  1. Gastroesophageal reflux disease, unspecified whether esophagitis present   2. Dysphagia, unspecified type   3. Long-term current use of proton pump inhibitor therapy   4. History of colon polyps    Longstanding GERD, no history of Barrett's.  She has required higher dosing of PPI to control symptoms.  We did discuss long-term risks/benefits of chronic PPI use.  She feels lower dosing does not control her symptoms well enough and understands risks of chronic PPI, wishes to continue at current dosing, 40 mg twice daily.  Refilled for the next year.  Had experienced dysphagia  at the last visit, modified barium swallow did not reveal any significant abnormalities.  Fortunately with time the symptoms have resolved, over the past 6 weeks or so she has had no dysphagia whatsoever.  Prior EGD has not shown any concerning pathology in her esophagus.  Continue to monitor for now, if symptoms persist we will either consider regular barium swallow with tablet versus EGD pending her course.  Otherwise monitor for now as this has resolved  We discussed her history of lung cancer, on chronic immunotherapy/chemotherapy which has led to an excellent result.  Her imaging shows she is in remission.  We discussed if she wanted to pursue any further surveillance colonoscopy.  I looked at her last colonoscopy and she had 1 small adenoma in 2019.  She would not be due for another exam until June 2026 at earliest.  We can discuss at that time if she  wants to pursue elective surveillance colonoscopy in light of her comorbidities   PLAN: - refill omeprazole  40mg  BID - discussed long term risks / benefits, she understands - dysphagia resolved, f/u PRN if recurs - change recall colonoscopy to June 2026. Can see me at that time if she wishes to pursue an exam (history of lung cancer, in remission on treatment)  Marcey Naval, MD [facility]

## 2023-09-08 NOTE — Patient Instructions (Signed)
 [facility] - A DEPT OF [facility]  Discharge Instructions: Thank you for choosing [facility] to provide your oncology and hematology care.   If you have a lab appointment with the Cancer Center, please go directly to the Cancer Center and check in at the registration area.   Wear comfortable clothing and clothing appropriate for easy access to any Portacath or PICC line.   We strive to give you quality time with your provider. You may need to reschedule your appointment if you arrive late (15 or more minutes).  Arriving late affects you and other patients whose appointments are after yours.  Also, if you miss three or more appointments without notifying the office, you may be dismissed from the clinic at the provider's discretion.      For prescription refill requests, have your pharmacy contact our office and allow 72 hours for refills to be completed.    Today you received the following chemotherapy and/or immunotherapy agent: Atezolizumab  (Tecentriq )   To help prevent nausea and vomiting after your treatment, we encourage you to take your nausea medication as directed.  BELOW ARE SYMPTOMS THAT SHOULD BE REPORTED IMMEDIATELY: *FEVER GREATER THAN 100.4 F (38 C) OR HIGHER *CHILLS OR SWEATING *NAUSEA AND VOMITING THAT IS NOT CONTROLLED WITH YOUR NAUSEA MEDICATION *UNUSUAL SHORTNESS OF BREATH *UNUSUAL BRUISING OR BLEEDING *URINARY PROBLEMS (pain or burning when urinating, or frequent urination) *BOWEL PROBLEMS (unusual diarrhea, constipation, pain near the anus) TENDERNESS IN MOUTH AND THROAT WITH OR WITHOUT PRESENCE OF ULCERS (sore throat, sores in mouth, or a toothache) UNUSUAL RASH, SWELLING OR PAIN  UNUSUAL VAGINAL DISCHARGE OR ITCHING   Items with * indicate a potential emergency and should be followed up as soon as possible or go to the Emergency Department if any problems should occur.  Please show the CHEMOTHERAPY ALERT CARD or  IMMUNOTHERAPY ALERT CARD at check-in to the Emergency Department and triage nurse.  Should you have questions after your visit or need to cancel or reschedule your appointment, please contact [facility] - A DEPT [facility]  Dept: [phone]  and follow the prompts.  Office hours are 8:00 a.m. to 4:30 p.m. Monday - Friday. Please note that voicemails left after 4:00 p.m. may not be returned until the following business day.  We are closed weekends and major holidays. You have access to a nurse at all times for urgent questions. Please call the main number to the clinic Dept: [phone] and follow the prompts.   For any non-urgent questions, you may also contact your provider using MyChart. We now offer e-Visits for anyone 11 and older to request care online for non-urgent symptoms. For details visit [email or web address].   Also download the MyChart app! Go to the app store, search MyChart, open the app, select [place], and log in with your MyChart username and password.

## 2023-09-08 NOTE — Patient Instructions (Signed)
 Follow up as needed.  Refills for Omeprazole  40 mg have been sent to your pharmacy.  Thank you for entrusting me with your care and for choosing [facility], Dr. Elspeth Naval   _______________________________________________________  If your blood pressure at your visit was 140/90 or greater, please contact your primary care physician to follow up on this.  _______________________________________________________  If you are age 67 or older, your body mass index should be between 23-30. Your Body mass index is 33.18 kg/m. If this is out of the aforementioned range listed, please consider follow up with your Primary Care Provider.  If you are age 68 or younger, your body mass index should be between 19-25. Your Body mass index is 33.18 kg/m. If this is out of the aformentioned range listed, please consider follow up with your Primary Care Provider.   ________________________________________________________  The [facility] providers would like to encourage you to use MYCHART to communicate with providers for non-urgent requests or questions.  Due to long hold times on the telephone, sending your provider a message by [facility] may be a faster and more efficient way to get a response.  Please allow 48 business hours for a response.  Please remember that this is for non-urgent requests.  _______________________________________________________  [facility] is using a team-based approach to care.  Your team is made up of your doctor and two to three APPS. Our APPS (Nurse Practitioners and Physician Assistants) work with your physician to ensure care continuity for you. They are fully qualified to address your health concerns and develop a treatment plan. They communicate directly with your gastroenterologist to care for you. Seeing the Advanced Practice Practitioners on your physician's team can help you by facilitating care more promptly, often allowing for earlier  appointments, access to diagnostic testing, procedures, and other specialty referrals.

## 2023-09-09 LAB — T4: T4, Total: 10.4 ug/dL (ref 4.5–12.0)

## 2023-09-10 LAB — AFP TUMOR MARKER: AFP-Tumor Marker: 3.9 ng/mL

## 2023-09-11 ENCOUNTER — Other Ambulatory Visit: Payer: Self-pay | Admitting: Nurse Practitioner

## 2023-09-15 ENCOUNTER — Ambulatory Visit: Payer: Self-pay | Admitting: Nurse Practitioner

## 2023-09-17 DIAGNOSIS — R5383 Other fatigue: Secondary | ICD-10-CM | POA: Diagnosis not present

## 2023-09-17 DIAGNOSIS — Z1329 Encounter for screening for other suspected endocrine disorder: Secondary | ICD-10-CM | POA: Diagnosis not present

## 2023-09-17 DIAGNOSIS — I10 Essential (primary) hypertension: Secondary | ICD-10-CM | POA: Diagnosis not present

## 2023-09-17 DIAGNOSIS — C3490 Malignant neoplasm of unspecified part of unspecified bronchus or lung: Secondary | ICD-10-CM | POA: Diagnosis not present

## 2023-09-17 DIAGNOSIS — Z13220 Encounter for screening for lipoid disorders: Secondary | ICD-10-CM | POA: Diagnosis not present

## 2023-09-24 DIAGNOSIS — Z7189 Other specified counseling: Secondary | ICD-10-CM | POA: Diagnosis not present

## 2023-09-24 DIAGNOSIS — Z0000 Encounter for general adult medical examination without abnormal findings: Secondary | ICD-10-CM | POA: Diagnosis not present

## 2023-09-30 ENCOUNTER — Encounter: Payer: Self-pay | Admitting: Hematology and Oncology

## 2023-09-30 ENCOUNTER — Inpatient Hospital Stay

## 2023-09-30 ENCOUNTER — Inpatient Hospital Stay: Attending: Hematology and Oncology

## 2023-09-30 ENCOUNTER — Other Ambulatory Visit: Payer: Self-pay | Admitting: Hematology and Oncology

## 2023-09-30 ENCOUNTER — Inpatient Hospital Stay (HOSPITAL_BASED_OUTPATIENT_CLINIC_OR_DEPARTMENT_OTHER): Admitting: Physician Assistant

## 2023-09-30 VITALS — BP 153/83 | HR 90 | Temp 97.9°F | Resp 16 | Wt 192.1 lb

## 2023-09-30 DIAGNOSIS — Z823 Family history of stroke: Secondary | ICD-10-CM | POA: Insufficient documentation

## 2023-09-30 DIAGNOSIS — K8689 Other specified diseases of pancreas: Secondary | ICD-10-CM | POA: Insufficient documentation

## 2023-09-30 DIAGNOSIS — C3431 Malignant neoplasm of lower lobe, right bronchus or lung: Secondary | ICD-10-CM | POA: Insufficient documentation

## 2023-09-30 DIAGNOSIS — M1612 Unilateral primary osteoarthritis, left hip: Secondary | ICD-10-CM | POA: Insufficient documentation

## 2023-09-30 DIAGNOSIS — J9811 Atelectasis: Secondary | ICD-10-CM | POA: Insufficient documentation

## 2023-09-30 DIAGNOSIS — N289 Disorder of kidney and ureter, unspecified: Secondary | ICD-10-CM | POA: Insufficient documentation

## 2023-09-30 DIAGNOSIS — Z888 Allergy status to other drugs, medicaments and biological substances status: Secondary | ICD-10-CM | POA: Insufficient documentation

## 2023-09-30 DIAGNOSIS — J438 Other emphysema: Secondary | ICD-10-CM | POA: Insufficient documentation

## 2023-09-30 DIAGNOSIS — Z79620 Long term (current) use of immunosuppressive biologic: Secondary | ICD-10-CM | POA: Insufficient documentation

## 2023-09-30 DIAGNOSIS — F32A Depression, unspecified: Secondary | ICD-10-CM | POA: Diagnosis not present

## 2023-09-30 DIAGNOSIS — K5730 Diverticulosis of large intestine without perforation or abscess without bleeding: Secondary | ICD-10-CM | POA: Diagnosis not present

## 2023-09-30 DIAGNOSIS — F17210 Nicotine dependence, cigarettes, uncomplicated: Secondary | ICD-10-CM | POA: Insufficient documentation

## 2023-09-30 DIAGNOSIS — Z809 Family history of malignant neoplasm, unspecified: Secondary | ICD-10-CM | POA: Insufficient documentation

## 2023-09-30 DIAGNOSIS — E278 Other specified disorders of adrenal gland: Secondary | ICD-10-CM | POA: Diagnosis not present

## 2023-09-30 DIAGNOSIS — M1990 Unspecified osteoarthritis, unspecified site: Secondary | ICD-10-CM | POA: Insufficient documentation

## 2023-09-30 DIAGNOSIS — Z825 Family history of asthma and other chronic lower respiratory diseases: Secondary | ICD-10-CM | POA: Insufficient documentation

## 2023-09-30 DIAGNOSIS — Z5112 Encounter for antineoplastic immunotherapy: Secondary | ICD-10-CM | POA: Insufficient documentation

## 2023-09-30 DIAGNOSIS — Z886 Allergy status to analgesic agent status: Secondary | ICD-10-CM | POA: Insufficient documentation

## 2023-09-30 DIAGNOSIS — I700 Atherosclerosis of aorta: Secondary | ICD-10-CM | POA: Insufficient documentation

## 2023-09-30 DIAGNOSIS — M47816 Spondylosis without myelopathy or radiculopathy, lumbar region: Secondary | ICD-10-CM | POA: Insufficient documentation

## 2023-09-30 DIAGNOSIS — I2510 Atherosclerotic heart disease of native coronary artery without angina pectoris: Secondary | ICD-10-CM | POA: Insufficient documentation

## 2023-09-30 DIAGNOSIS — K7460 Unspecified cirrhosis of liver: Secondary | ICD-10-CM | POA: Diagnosis not present

## 2023-09-30 DIAGNOSIS — M47814 Spondylosis without myelopathy or radiculopathy, thoracic region: Secondary | ICD-10-CM | POA: Insufficient documentation

## 2023-09-30 DIAGNOSIS — Z90721 Acquired absence of ovaries, unilateral: Secondary | ICD-10-CM | POA: Insufficient documentation

## 2023-09-30 DIAGNOSIS — M419 Scoliosis, unspecified: Secondary | ICD-10-CM | POA: Diagnosis not present

## 2023-09-30 DIAGNOSIS — Z8719 Personal history of other diseases of the digestive system: Secondary | ICD-10-CM | POA: Insufficient documentation

## 2023-09-30 DIAGNOSIS — R600 Localized edema: Secondary | ICD-10-CM

## 2023-09-30 DIAGNOSIS — Z880 Allergy status to penicillin: Secondary | ICD-10-CM | POA: Insufficient documentation

## 2023-09-30 DIAGNOSIS — Z79899 Other long term (current) drug therapy: Secondary | ICD-10-CM | POA: Insufficient documentation

## 2023-09-30 DIAGNOSIS — Z8249 Family history of ischemic heart disease and other diseases of the circulatory system: Secondary | ICD-10-CM | POA: Insufficient documentation

## 2023-09-30 DIAGNOSIS — Z882 Allergy status to sulfonamides status: Secondary | ICD-10-CM | POA: Insufficient documentation

## 2023-09-30 DIAGNOSIS — E119 Type 2 diabetes mellitus without complications: Secondary | ICD-10-CM | POA: Diagnosis not present

## 2023-09-30 DIAGNOSIS — Z8261 Family history of arthritis: Secondary | ICD-10-CM | POA: Insufficient documentation

## 2023-09-30 DIAGNOSIS — J449 Chronic obstructive pulmonary disease, unspecified: Secondary | ICD-10-CM | POA: Insufficient documentation

## 2023-09-30 DIAGNOSIS — F419 Anxiety disorder, unspecified: Secondary | ICD-10-CM | POA: Diagnosis not present

## 2023-09-30 DIAGNOSIS — I10 Essential (primary) hypertension: Secondary | ICD-10-CM | POA: Insufficient documentation

## 2023-09-30 DIAGNOSIS — E876 Hypokalemia: Secondary | ICD-10-CM | POA: Insufficient documentation

## 2023-09-30 DIAGNOSIS — E785 Hyperlipidemia, unspecified: Secondary | ICD-10-CM | POA: Insufficient documentation

## 2023-09-30 DIAGNOSIS — Z811 Family history of alcohol abuse and dependence: Secondary | ICD-10-CM | POA: Insufficient documentation

## 2023-09-30 DIAGNOSIS — E039 Hypothyroidism, unspecified: Secondary | ICD-10-CM | POA: Diagnosis not present

## 2023-09-30 DIAGNOSIS — G8929 Other chronic pain: Secondary | ICD-10-CM | POA: Insufficient documentation

## 2023-09-30 DIAGNOSIS — Z9841 Cataract extraction status, right eye: Secondary | ICD-10-CM | POA: Insufficient documentation

## 2023-09-30 DIAGNOSIS — Z9049 Acquired absence of other specified parts of digestive tract: Secondary | ICD-10-CM | POA: Insufficient documentation

## 2023-09-30 DIAGNOSIS — Z820 Family history of epilepsy and other diseases of the nervous system: Secondary | ICD-10-CM | POA: Insufficient documentation

## 2023-09-30 DIAGNOSIS — Z9842 Cataract extraction status, left eye: Secondary | ICD-10-CM | POA: Insufficient documentation

## 2023-09-30 DIAGNOSIS — Z96641 Presence of right artificial hip joint: Secondary | ICD-10-CM | POA: Insufficient documentation

## 2023-09-30 DIAGNOSIS — Z90710 Acquired absence of both cervix and uterus: Secondary | ICD-10-CM | POA: Insufficient documentation

## 2023-09-30 LAB — CMP (CANCER CENTER ONLY)
ALT: 13 U/L (ref 0–44)
AST: 16 U/L (ref 15–41)
Albumin: 3.5 g/dL (ref 3.5–5.0)
Alkaline Phosphatase: 79 U/L (ref 38–126)
Anion gap: 7 (ref 5–15)
BUN: 12 mg/dL (ref 8–23)
CO2: 27 mmol/L (ref 22–32)
Calcium: 8.4 mg/dL — ABNORMAL LOW (ref 8.9–10.3)
Chloride: 101 mmol/L (ref 98–111)
Creatinine: 1.11 mg/dL — ABNORMAL HIGH (ref 0.44–1.00)
GFR, Estimated: 54 mL/min — ABNORMAL LOW (ref >60)
Glucose, Bld: 86 mg/dL (ref 70–99)
Potassium: 3.9 mmol/L (ref 3.5–5.1)
Sodium: 135 mmol/L (ref 135–145)
Total Bilirubin: 0.4 mg/dL (ref 0.0–1.2)
Total Protein: 6.1 g/dL — ABNORMAL LOW (ref 6.5–8.1)

## 2023-09-30 LAB — CBC WITH DIFFERENTIAL (CANCER CENTER ONLY)
Abs Immature Granulocytes: 0.06 K/uL (ref 0.00–0.07)
Basophils Absolute: 0 K/uL (ref 0.0–0.1)
Basophils Relative: 1 %
Eosinophils Absolute: 0.3 K/uL (ref 0.0–0.5)
Eosinophils Relative: 4 %
HCT: 40.7 % (ref 36.0–46.0)
Hemoglobin: 13.6 g/dL (ref 12.0–15.0)
Immature Granulocytes: 1 %
Lymphocytes Relative: 24 %
Lymphs Abs: 2.1 K/uL (ref 0.7–4.0)
MCH: 30 pg (ref 26.0–34.0)
MCHC: 33.4 g/dL (ref 30.0–36.0)
MCV: 89.6 fL (ref 80.0–100.0)
Monocytes Absolute: 0.6 K/uL (ref 0.1–1.0)
Monocytes Relative: 7 %
Neutro Abs: 5.6 K/uL (ref 1.7–7.7)
Neutrophils Relative %: 63 %
Platelet Count: 181 K/uL (ref 150–400)
RBC: 4.54 MIL/uL (ref 3.87–5.11)
RDW: 14.9 % (ref 11.5–15.5)
WBC Count: 8.8 K/uL (ref 4.0–10.5)
nRBC: 0 % (ref 0.0–0.2)

## 2023-09-30 LAB — TSH: TSH: 0.557 u[IU]/mL (ref 0.350–4.500)

## 2023-09-30 MED ORDER — SODIUM CHLORIDE 0.9 % IV SOLN: Freq: Once | INTRAVENOUS | Status: AC

## 2023-09-30 MED ORDER — SODIUM CHLORIDE 0.9 % IV SOLN
1200.0000 mg | Freq: Once | INTRAVENOUS | Status: AC
  Administered 2023-09-30: 1200 mg via INTRAVENOUS
  Filled 2023-09-30: qty 20

## 2023-09-30 MED ORDER — ONDANSETRON HCL 4 MG/2ML IJ SOLN
8.0000 mg | Freq: Once | INTRAMUSCULAR | Status: AC
  Administered 2023-09-30: 8 mg via INTRAVENOUS
  Filled 2023-09-30: qty 4

## 2023-09-30 NOTE — Progress Notes (Signed)
 [facility] [phone]   [phone]  PROGRESS NOTE  Patient Care Team: [name], FNP as PCP - General (Family Medicine) O'Neal, Darryle Ned, MD as PCP - Cardiology (Cardiology) [name], MD (Inactive) as Attending Physician (Gastroenterology) [name], MD as Consulting Physician (Orthopedic Surgery) [name], MD as Consulting Physician (Orthopedic Surgery) [name], MD (Inactive) as Consulting Physician (Neurology) [name], MD as Consulting Physician (Neurology) [name], [facility] (Inactive) (Pharmacist) [facility]ll[facility], [facility]   Hematological/Oncological History # Small Cell Lung Cancer, Extensive Stage 07/05/2020: CT abdomen for lower abdominal pain. New pulmonary nodular density noted 07/06/2020: CT chest showed 2.2 cm macrolobulated right lower lobe pulmonary nodule (favored) versus pathologically enlarged infrahilar lymph node 10/13/2020: PET CT scan performed, findings show 2 cm right lower lobe lung mass is hypermetabolic and consistent with primary lung neoplasm. Additionally found hypermetabolic 17 mm soft tissue lesion between the descending duodenum and the pancreatic head  10/19/2020: EGD to biopsy hypermetabolic lymph node. Biopsy results show small cell lung cancer 10/26/2020: establish care with Dr. Federico  11/13/2020: Cycle 1 Day 1 of Carbo/Etop/Atezolizumab  12/04/2020: Cycle 2 Day 1 of Carbo/Etop/Atezolizumab  11/22-11/25/2022: admitted for E. Coli bacteremia/sepsis. Start of Cycle 3 delayed. 01/02/2021: Cycle 3 Day 1 of Carbo/Etop/Atezolizumab  01/23/2021: Cycle 4 Day 1 of Carbo/Etop/Atezolizumab  02/22/2021: Cycle 5 Day 1 of Atezolizumab  Maintenance. Delayed start due to patient's COVID infection.  03/14/2021: Cycle 6 Day 1 of Atezolizumab  Maintenance 04/05/2021: Cycle 7 Day 1 of Atezolizumab  Maintenance 04/25/2021: Cycle 8 Day 1 of Atezolizumab   Maintenance 05/16/2021: Cycle 9 Day 1 of Atezolizumab  Maintenance 06/06/2021: Cycle 10 Day 1 of Atezolizumab  Maintenance 06/27/2021:  Cycle 11 Day 1 of Atezolizumab  Maintenance 07/18/2021: Cycle 12 Day 1 of Atezolizumab  Maintenance 08/08/2021: Cycle 13 Day 1 of Atezolizumab  Maintenance 08/29/2021: Cycle 14 Day 1 of Atezolizumab  Maintenance 09/19/2021: Cycle 15 Day 1 of Atezolizumab  Maintenance 10/10/2021: Cycle 16 Day 1 of Atezolizumab  Maintenance 10/31/2021: Cycle 17 Day 1 of Atezolizumab  Maintenance 11/21/2021: Cycle 18 Day 1 of Atezolizumab  Maintenance 12/12/2021: Cycle 19 Day 1 of Atezolizumab  Maintenance 01/02/2022: Cycle 20 Day 1 of Atezolizumab  Maintenance 01/23/2022: Cycle 21 Day 1 of Atezolizumab  Maintenance 02/13/2022: treatment HELD due to diarrhea.  03/06/2022: Cycle 22 Day 1 of Atezolizumab  Maintenance 03/27/2022: Cycle 23 Day 1 of Atezolizumab  Maintenance 04/17/2022: Cycle 24 Day 1 of Atezolizumab  Maintenance 05/08/2022:  Cycle 25 Day 1 of Atezolizumab  Maintenance (Held due to patient preference for treatment holiday) 05/29/2022: Cycle 25 Day 1 of Atezolizumab  Maintenance  06/19/2022: Cycle 26 Day 1 of Atezolizumab  Maintenance  07/12/2022: Cycle 27 Day 1 of Atezolizumab  Maintenance 07/31/2022: Cycle 28 Day 1 of Atezolizumab  Maintenance HELD per patient request for fatigue.  08/22/2022: Cycle 28 Day 1 of Atezolizumab  Maintenance 09/11/2022: Cycle 29 Day 1 of Atezolizumab  Maintenance 10/03/2022: Cycle 30 Day 1 of Atezolizumab  Maintenance 10/23/2022: Cycle 31 Day 1 of Atezolizumab  Maintenance 11/13/2022: Cycle 32 Day 1 of Atezolizumab  Maintenance 12/24/2022: Cycle 33 Day 1 of Atezolizumab  Maintenance 01/15/2023: Cycle 34 Day 1 of Atezolizumab  Maintenance 02/05/2023: Cycle 35 Day 1 of Atezolizumab  Maintenance 02/26/2023: Cycle 36 Day 1 of Atezolizumab  Maintenance 04/28/2023: Cycle 37 Day 1 of Atezolizumab  Maintenance 04/30/2023: Cycle 38 Day 1 of Atezolizumab  Maintenance 05/21/2023: Cycle 39 Day 1 of  Atezolizumab  Maintenance 06/11/2023: Cycle 40 Day 1 of Atezolizumab  Maintenance 07/28/2023: Cycle 41 Day 1 of Atezolizumab  Maintenance 08/18/2023: Cycle 42 Day 1 of Atezolizumab  Maintenance 09/08/2023: Cycle 43 Day 1 of Atezolizumab  Maintenance 09/30/2023:  Cycle  44 Day 1 of Atezolizumab  Maintenance  Interval History:  [name] 68 y.o. female with medical history significant for extensive stage small cell lung cancer who presents for a follow up visit. The patient's last visit was on 8/111/2025.  She presents today to start cycle 44 of maintenance atezolizumab . She is unaccompanied for this visit.   On exam today [name] reports she has noticed worsening lower extremity edema.  She is only taking Lasix  20 mg once daily.  She has decreased salt in her diet.  She is otherwise feeling stable.  Her energy and appetite are overall stable.  She denies nausea, vomiting or bowel habit changes.  She denies easy bruising or signs of active bleeding.  Her shortness of breath is at baseline and mainly with exertion.  Patient denies fevers, chills, night sweats, chest pain, cough, or headaches. Full 10 point ROS is otherwise negative.  MEDICAL HISTORY:  Past Medical History:  Diagnosis Date   Allergic rhinitis    Anemia    Anxiety    Chicken pox    Chronic back pain    COPD (chronic obstructive pulmonary disease) (HCC)    Depression    DM (diabetes mellitus) (HCC)    Essential hypertension    GERD (gastroesophageal reflux disease)    Headache    migraines   History of gastritis    EGD 2015   History of home oxygen  therapy    2 liters at hs last 6 months   Hyperlipidemia    Hypothyroidism    Migraines    Osteoarthritis    oa   Scoliosis    Small cell lung cancer (HCC)    Stage 4    SURGICAL HISTORY: Past Surgical History:  Procedure Laterality Date   APPENDECTOMY     1985   BIOPSY  07/24/2017   Procedure: BIOPSY;  Surgeon: [name], MD;  Location: [facility];  Service:  Endoscopy;;   CARDIAC CATHETERIZATION N/A 10/31/2015   Procedure: Left Heart Cath and Coronary Angiography;  Surgeon: Alm LELON Clay, MD;  Location: [facility] INVASIVE CV LAB;  Service: Cardiovascular;  Laterality: N/A;   CARPAL TUNNEL RELEASE Left    CARPAL TUNNEL RELEASE Right    CATARACT EXTRACTION W/PHACO Left 02/07/2023   Procedure: CATARACT EXTRACTION PHACO AND INTRAOCULAR LENS PLACEMENT (IOC);  Surgeon: Juli Blunt, MD;  Location: [facility];  Service: Ophthalmology;  Laterality: Left;  CDE: 3.51   CATARACT EXTRACTION W/PHACO Right 02/21/2023   Procedure: CATARACT EXTRACTION PHACO AND INTRAOCULAR LENS PLACEMENT (IOC);  Surgeon: Juli Blunt, MD;  Location: [facility];  Service: Ophthalmology;  Laterality: Right;  CDE: 2.94   CHOLECYSTECTOMY  late 1980's   COLONOSCOPY WITH PROPOFOL  N/A 07/24/2017   Procedure: COLONOSCOPY WITH PROPOFOL ;  Surgeon: [name], MD;  Location: [facility];  Service: Endoscopy;  Laterality: N/A;   ESOPHAGOGASTRODUODENOSCOPY N/A 07/24/2017   Procedure: ESOPHAGOGASTRODUODENOSCOPY (EGD);  Surgeon: [name], MD;  Location: [facility];  Service: Endoscopy;  Laterality: N/A;   ESOPHAGOGASTRODUODENOSCOPY (EGD) WITH PROPOFOL  N/A 10/19/2020   Procedure: ESOPHAGOGASTRODUODENOSCOPY (EGD) WITH PROPOFOL ;  Surgeon: [name], MD;  Location: [facility];  Service: Endoscopy;  Laterality: N/A;   FINE NEEDLE ASPIRATION N/A 10/19/2020   Procedure: FINE NEEDLE ASPIRATION (FNA) LINEAR;  Surgeon: [name], MD;  Location: [facility];  Service: Endoscopy;  Laterality: N/A;   GALLBLADDER SURGERY  1991   HIP CLOSED REDUCTION Right 01/08/2016   Procedure: CLOSED MANIPULATION HIP;  Surgeon: Reyes Billing, MD;  Location: [facility];  Service: Orthopedics;  Laterality: Right;   HIP CLOSED REDUCTION Right 01/19/2016   Procedure: ATTEMPTED CLOSED REDUCTION RIGHT HIP;  Surgeon: Norleen Armor, MD;  Location: [facility];  Service: Orthopedics;  Laterality: Right;   HIP CLOSED  REDUCTION Right 01/20/2016   Procedure: CLOSED REDUCTION RIGHT TOTAL HIP;  Surgeon: Donnice Car, MD;  Location: [facility];  Service: Orthopedics;  Laterality: Right;   HIP CLOSED REDUCTION Right 02/17/2016   Procedure: CLOSED REDUCTION RIGHT TOTAL HIP;  Surgeon: Redell Shoals, MD;  Location: [facility];  Service: Orthopedics;  Laterality: Right;   HIP CLOSED REDUCTION Right 02/28/2016   Procedure: CLOSED REDUCTION HIP;  Surgeon: Selinda Belvie Gosling, MD;  Location: [facility];  Service: Orthopedics;  Laterality: Right;   IR IMAGING GUIDED PORT INSERTION  11/01/2020   POLYPECTOMY  07/24/2017   Procedure: POLYPECTOMY;  Surgeon: [name], MD;  Location: [facility];  Service: Endoscopy;;   TONSILLECTOMY     TOTAL ABDOMINAL HYSTERECTOMY     1985, with 1 ovary removed and 2 nd ovary removed 2003   TOTAL HIP ARTHROPLASTY Right    Original surgery 2006 with revision 2010   TOTAL HIP REVISION Right 01/01/2016   Procedure: TOTAL HIP REVISION;  Surgeon: Donnice Car, MD;  Location: [facility];  Service: Orthopedics;  Laterality: Right;   TOTAL HIP REVISION Right 03/02/2016   Procedure: TOTAL HIP REVISION;  Surgeon: Car Donnice, MD;  Location: [facility];  Service: Orthopedics;  Laterality: Right;   TOTAL HIP REVISION Right 09/02/2016   Procedure: Right hip constrained liner- posterior;  Surgeon: Car Donnice, MD;  Location: [facility];  Service: Orthopedics;  Laterality: Right;   ULNAR NERVE TRANSPOSITION Right    UPPER ESOPHAGEAL ENDOSCOPIC ULTRASOUND (EUS) N/A 10/19/2020   Procedure: UPPER ESOPHAGEAL ENDOSCOPIC ULTRASOUND (EUS);  Surgeon: [name], MD;  Location: [facility];  Service: Endoscopy;  Laterality: N/A;  periduodenal lesion    SOCIAL HISTORY: Social History   Socioeconomic History   Marital status: Married    Spouse name: Not on file   Number of children: 2   Years of education: Not on file   Highest education level: 12th grade  Occupational History   Occupation: disabled   Occupation:  disabled  Tobacco Use   Smoking status: Every Day    Current packs/day: 2.00    Average packs/day: 2.0 packs/day for 46.0 years (92.0 ttl pk-yrs)    Types: Cigarettes   Smokeless tobacco: Never   Tobacco comments:    2 packs of cigarettes smoked daily 12/13/21- declines smoking cessation  Vaping Use   Vaping status: Never Used  Substance and Sexual Activity   Alcohol use: No   Drug use: No   Sexual activity: Not Currently    Partners: Male  Other Topics Concern   Not on file  Social History Narrative   Right handed    Caffeine~ 2 cups per day    Lives at home with husband (strained relationship)   Primary caretaker for disabled brother who had aneurism   Daughter died 06-14-18   Social Drivers of Corporate investment banker Strain: Low Risk  (07/31/2023)   Received from [facility]   Overall Financial Resource Strain (CARDIA)    How hard is it for you to pay for the very basics like food, housing, medical care, and heating?: Not very hard  Food Insecurity: No Food Insecurity (07/31/2023)   Received from [facility]   Hunger Vital  Sign    Within the past 12 months, you worried that your food would run out before you got the money to buy more.: Never true    Within the past 12 months, the food you bought just didn't last and you didn't have money to get more.: Never true  Recent Concern: Food Insecurity - Food Insecurity Present (05/20/2023)   Hunger Vital Sign    Worried About Running Out of Food in the Last Year: Sometimes true    Ran Out of Food in the Last Year: Sometimes true  Transportation Needs: No Transportation Needs (07/31/2023)   Received from [facility]   PRAPARE - Transportation    Lack of Transportation (Medical): No    Lack of Transportation (Non-Medical): No  Physical Activity: Inactive (05/20/2023)   Exercise Vital Sign    Days of Exercise per Week: 0 days    Minutes of Exercise per Session: 0 min  Stress: No Stress Concern Present (05/20/2023)    [employer] of Occupational Health - Occupational Stress Questionnaire    Feeling of Stress : Only a little  Recent Concern: Stress - Stress Concern Present (03/31/2023)   [employer] of Occupational Health - Occupational Stress Questionnaire    Feeling of Stress : Very much  Social Connections: Moderately Integrated (05/20/2023)   Social Connection and Isolation Panel    Frequency of Communication with Friends and Family: More than three times a week    Frequency of Social Gatherings with Friends and Family: More than three times a week    Attends Religious Services: 1 to 4 times per year    Active Member of [employer] or Organizations: No    Attends Banker Meetings: Never    Marital Status: Married  Catering manager Violence: Not At Risk (07/31/2023)   Received from [facility]   Humiliation, Afraid, Rape, and Kick questionnaire    Within the last year, have you been afraid of your partner or ex-partner?: No    Within the last year, have you been humiliated or emotionally abused in other ways by your partner or ex-partner?: No    Within the last year, have you been kicked, hit, slapped, or otherwise physically hurt by your partner or ex-partner?: No    Within the last year, have you been raped or forced to have any kind of sexual activity by your partner or ex-partner?: No    FAMILY HISTORY: Family History  Problem Relation Age of Onset   COPD Mother    Heart disease Mother    Lung disease Father        Asbestosis   Heart attack Father    Heart disease Father    Cerebral aneurysm Brother    Aneurysm Brother        Brain   Drug abuse Daughter    Epilepsy Son    Alcohol abuse Son    Drug abuse Son    Arthritis Maternal Grandmother    Heart disease Maternal Grandmother    Asthma Maternal Grandfather    Cancer Maternal Grandfather    Arthritis Paternal Grandmother    Heart disease Paternal Grandmother    Stroke Paternal Grandmother    Early death  Paternal Grandfather    Heart disease Paternal Grandfather     ALLERGIES:  is allergic to nisoldipine, metformin  and related, nsaids, pregabalin, wellbutrin  [bupropion ], aleve [naproxen sodium], penicillins, and sulfonamide derivatives.  MEDICATIONS:  Current Outpatient Medications  Medication Sig Dispense Refill   albuterol  (PROAIR   HFA) 108 (90 Base) MCG/ACT inhaler 2 puffs every 4 hours as needed only  if your can't catch your breath 18 g 5   ALPRAZolam  (XANAX ) 1 MG tablet TAKE 1 TABLET BY MOUTH THREE TIMES DAILY AS NEEDED FOR ANXIETY 90 tablet 2   ARIPiprazole  (ABILIFY ) 5 MG tablet Take 1 tablet (5 mg total) by mouth daily. 30 tablet 5   atorvastatin  (LIPITOR) 20 MG tablet TAKE 1 TABLET BY MOUTH AT BEDTIME 90 tablet 1   benzonatate  (TESSALON ) 100 MG capsule TAKE ONE CAPSULE BY MOUTH THREE TIMES DAILY 90 capsule 1   calcipotriene  (DOVONOX) 0.005 % cream Apply to affected area bid when on break from steroid cream 120 g 3   cyclobenzaprine  (FLEXERIL ) 10 MG tablet Take 10 mg by mouth 3 (three) times daily as needed.     dicyclomine  (BENTYL ) 10 MG capsule Take 1 capsule (10 mg total) by mouth every 8 (eight) hours as needed for spasms. Please keep your August appointment for further refills. Thank you 60 capsule 1   diphenoxylate -atropine  (LOMOTIL ) 2.5-0.025 MG tablet TAKE 1 TABLET BY MOUTH FOUR TIMES DAILY AS NEEDED FOR diarrhea OR loose stools 60 tablet 0   DULoxetine  (CYMBALTA ) 60 MG capsule TAKE TWO CAPSULES BY MOUTH EVERY DAY 180 capsule 1   ferrous sulfate  325 (65 FE) MG EC tablet TAKE 1 TABLET BY MOUTH DAILY WITH BREAKFAST 30 tablet 3   fluconazole  (DIFLUCAN ) 150 MG tablet Take 150 mg by mouth once.     fluticasone  (FLONASE ) 50 MCG/ACT nasal spray INSTILL 2 SPRAYS IN EACH NOSTRIL EVERY DAY 16 g 6   furosemide  (LASIX ) 20 MG tablet TAKE 1 TABLET BY MOUTH TWICE DAILY, IF SWELLING IMPROVES CAN HOLD MEDICATION 90 tablet 0   HYDROcodone -acetaminophen  (NORCO) 10-325 MG tablet Take 1 tablet by  mouth every 4 (four) hours as needed. To fill 08/29/23- 180 tablet 0   HYDROcodone -acetaminophen  (NORCO) 10-325 MG tablet Take 1 tablet by mouth every 4 (four) hours as needed. 180 tablet 0   hydrOXYzine  (ATARAX ) 10 MG tablet TAKE 1 TABLET BY MOUTH THREE TIMES DAILY AS NEEDED FOR ITCHING 30 tablet 2   levothyroxine  (SYNTHROID ) 137 MCG tablet Take 1 tablet (137 mcg total) by mouth daily before breakfast. 90 tablet 3   lidocaine -prilocaine  (EMLA ) cream Apply 1 Application topically as needed. 30 g 2   loratadine  (CLARITIN ) 10 MG tablet Take 10 mg by mouth daily.     meclizine  (ANTIVERT ) 12.5 MG tablet Take 1 tablet (12.5 mg total) by mouth 2 (two) times daily. 60 tablet 1   meloxicam  (MOBIC ) 15 MG tablet TAKE 1 TABLET BY MOUTH EVERY DAY 30 tablet 2   NARCAN  4 MG/0.1ML LIQD nasal spray kit SMARTSIG:Both Nares     nystatin  (MYCOSTATIN ) 100000 UNIT/ML suspension      OLANZapine  (ZYPREXA ) 10 MG tablet Take 1 tablet (10 mg total) by mouth at bedtime. 30 tablet 2   omeprazole  (PRILOSEC) 40 MG capsule Take 1 capsule (40 mg total) by mouth 2 (two) times daily. 180 capsule 3   ondansetron  (ZOFRAN ) 8 MG tablet TAKE 1 TABLET BY MOUTH TWICE DAILY 60 tablet 1   permethrin  (ELIMITE ) 5 % cream      potassium chloride  SA (KLOR-CON  M) 20 MEQ tablet TAKE 1 TABLET BY MOUTH TWICE DAILY 60 tablet 2   prochlorperazine  (COMPAZINE ) 10 MG tablet Take 1 tablet (10 mg total) by mouth every 6 (six) hours as needed for nausea or vomiting. 30 tablet 0   Rimegepant Sulfate (NURTEC) 75  MG TBDP TAKE 1 TABLET BY MOUTH DAILY AS NEEDED FOR MIGRAINE, take WITHIN 15 MINUTES of initial symptoms 8 tablet 1   topiramate  (TOPAMAX ) 100 MG tablet TAKE 1 TABLET BY MOUTH AT BEDTIME 90 tablet 1   triamcinolone  ointment (KENALOG ) 0.5 % Apply 1 Application topically 3 (three) times daily. Apply thin-layer on affected areas, no more than 2 weeks consistently. 60 g 2   triamterene -hydrochlorothiazide  (MAXZIDE -25) 37.5-25 MG tablet Take 1 tablet by  mouth daily.     umeclidinium-vilanterol (ANORO ELLIPTA ) 62.5-25 MCG/ACT AEPB Inhale 1 puff into the lungs daily. 60 each 11   No current facility-administered medications for this visit.    REVIEW OF SYSTEMS:   Constitutional: ( - ) fevers, ( - )  chills , ( - ) night sweats Eyes: ( - ) blurriness of vision, ( - ) double vision, ( - ) watery eyes Ears, nose, mouth, throat, and face: ( - ) mucositis, ( - ) sore throat Respiratory: ( - ) cough, ( -) dyspnea, ( - ) wheezes Cardiovascular: ( - ) palpitation, ( - ) chest discomfort, ( + ) lower extremity swelling Gastrointestinal:  ( -) nausea, ( - ) heartburn, ( - ) change in bowel habits Skin: ( - ) abnormal skin rashes Lymphatics: ( - ) new lymphadenopathy, ( - ) easy bruising Neurological: (+ ) numbness, ( - ) tingling, ( - ) new weaknesses Behavioral/Psych: ( - ) mood change, ( - ) new changes  All other systems were reviewed with the patient and are negative.  PHYSICAL EXAMINATION: ECOG PERFORMANCE STATUS: 1 - Symptomatic but completely ambulatory  Vitals:   09/30/23 1131  BP: (!) 153/83  Pulse: 90  Resp: 16  Temp: 97.9 F (36.6 C)  SpO2: 95%     Filed Weights   09/30/23 1131  Weight: 192 lb 1 oz (87.1 kg)      GENERAL: Well-appearing middle-age Caucasian female, alert, no distress and comfortable SKIN: skin color, texture, turgor are normal, no rashes or significant lesions EYES: conjunctiva are pink and non-injected, sclera clear LUNGS:  normal breathing effort. Diffuse wheezing hear on ausculation.  HEART: regular rate & rhythm and no murmurs. +1 bilateral pitting lower extremity edema starting mid calf. Musculoskeletal: no cyanosis of digits and no clubbing  PSYCH: alert & oriented x 3, fluent speech NEURO: no focal motor/sensory deficits  LABORATORY DATA:  I have reviewed the data as listed    Latest Ref Rng & Units 09/30/2023   10:48 AM 09/08/2023   10:51 AM 08/18/2023   10:15 AM  CBC  WBC 4.0 - 10.5 K/uL  8.8  7.1  7.5   Hemoglobin 12.0 - 15.0 g/dL 86.3  86.6  86.4   Hematocrit 36.0 - 46.0 % 40.7  39.8  41.2   Platelets 150 - 400 K/uL 181  154  181        Latest Ref Rng & Units 09/30/2023   10:48 AM 09/08/2023   10:51 AM 08/18/2023   10:15 AM  CMP  Glucose 70 - 99 mg/dL 86  92  96   BUN 8 - 23 mg/dL 12  16  13    Creatinine 0.44 - 1.00 mg/dL 8.88  8.70  8.77   Sodium 135 - 145 mmol/L 135  135  139   Potassium 3.5 - 5.1 mmol/L 3.9  3.6  3.9   Chloride 98 - 111 mmol/L 101  100  105   CO2 22 - 32 mmol/L 27  30  28   Calcium  8.9 - 10.3 mg/dL 8.4  8.4  8.4   Total Protein 6.5 - 8.1 g/dL 6.1  5.9  6.1   Total Bilirubin 0.0 - 1.2 mg/dL 0.4  0.4  0.4   Alkaline Phos 38 - 126 U/L 79  69  85   AST 15 - 41 U/L 16  15  13    ALT 0 - 44 U/L 13  10  8      No results found for: MPROTEIN Lab Results  Component Value Date   KPAFRELGTCHN 0.75 06/30/2014   LAMBDASER 3.78 (H) 06/30/2014   KAPLAMBRATIO 0.20 (L) 06/30/2014     RADIOGRAPHIC STUDIES: No results found.    ASSESSMENT & PLAN COLLENE MASSIMINO 68 y.o. female with medical history significant for extensive stage small cell lung cancer who presents for a follow up visit.   After review of the labs, review of the records, and discussion with the patient the patients findings are most consistent with extensive stage small cell lung cancer with metastasis from the right lower lobe to the lymph nodes of the abdomen.  At this time we will pursue triple therapy with carboplatin , etoposide , and atezolizumab .  After 4 cycles we will convert to maintenance atezolizumab  alone.  We previously discussed the risks and benefits of this therapy and the patient was in agreement to proceed with this treatment.  The treatment of choice consist of carboplatin , etoposide , and atezolizumab .  The regimen consists of carboplatin  AUC of 5 IV on day 1, etoposide  100 mg per metered squared IV on day 1, 2, and 3 and atezolizumab  1200 mg on day 1.  This continues for  21-day cycles.  After 4 cycles the patient proceeds with atezolizumab  maintenance therapy alone.    # Small Cell Lung Cancer, Extensive Stage -- MRI of the brain shows no evidence of intracranial spread --Findings are currently consistent with metastatic small cell lung cancer with metastatic spread to the lymph nodes of the abdomen -- Started carboplatin , etoposide , and atezolizumab  on 11/13/2020. Transitioned to maintenance Atezolizumab  on 02/22/2021.  Plan: --today is Cycle 44 Day 1 of Atezolizumab  maintenance.  --labs today were reviewed and adequate for treatment. WBC 8.8, hemoglobin 13.6, MCV 89.6, platelets 181, creatinine stable at 1.11 and LFTs in range -- Most recent CT CAP from 07/07/2023 which shows no evidence of recurrence. Repeat scan scheduled for 10/06/2023 --Proceed with treatment without any dose modifications.  --RTC in 3 weeks for a follow up before Cycle 45  # Early Cirrhosis --noted on MR abdomen from 05/22/2022. US  with elastography performed on 06/18/2022. --continue to follow with GI.   #Right renal lesion: --Noted to be benign on most recent MRI of the abdomen.  #Diarrhea-improved --stool sent for C. Diff and GI pathology panel, all negative --patient following with GI and found to have pancreatic insufficiency and started on creon  with improvement of symptoms.   #Nausea: --Symptoms improved with zofran  and olanzapine .  --Added IV zofran  to infusion premeds for better symptom control.   #Hypokalemia:  --currently taking potassium chloride  20 meq BID.   --potassium level is 3.9 today. Continue PO supplementation.   #Lower extremity edema --Currently takes lasix  20 mg once daily day. --Advised to increase Lasix  to twice daily and wear compression stockings.   #Neuropathy involving fingers/feet: --Patient currently takes Cymbalta    #Supportive Care -- chemotherapy education complete -- port placed -- zofran  8mg  q8H PRN and compazine  10mg  PO q6H for  nausea -- EMLA  cream for port  No orders of the defined types were placed in this encounter.  All questions were answered. The patient knows to call the clinic with any problems, questions or concerns.  I have spent a total of 30 minutes minutes of face-to-face and non-face-to-face time, preparing to see the patient, performing a medically appropriate examination, counseling and educating the patient, ordering medications/tests, documenting clinical information in the electronic health record, and care coordination.   Johnston Police PA-C Dept of Hematology and Oncology [facility] Phone: [phone]    09/30/2023 12:11 PM

## 2023-09-30 NOTE — Addendum Note (Signed)
 Addended by: Reisa Coppola T on: 09/30/2023 12:17 PM   Modules accepted: Orders

## 2023-10-01 LAB — T4: T4, Total: 11.2 ug/dL (ref 4.5–12.0)

## 2023-10-06 ENCOUNTER — Encounter: Admitting: Physical Medicine and Rehabilitation

## 2023-10-06 ENCOUNTER — Telehealth: Payer: Self-pay | Admitting: Physical Medicine and Rehabilitation

## 2023-10-06 ENCOUNTER — Encounter (HOSPITAL_COMMUNITY): Payer: Self-pay

## 2023-10-06 ENCOUNTER — Ambulatory Visit (HOSPITAL_COMMUNITY)

## 2023-10-06 NOTE — Telephone Encounter (Signed)
 Patient called to cancel today's appointment due to her being sick and would like to reschedule. She is to see you every two month and already has her appt for November but there are no open slots to reschedule today's appt until then. Patient would like some instruction on how to move forward

## 2023-10-08 DIAGNOSIS — H353112 Nonexudative age-related macular degeneration, right eye, intermediate dry stage: Secondary | ICD-10-CM | POA: Diagnosis not present

## 2023-10-08 DIAGNOSIS — Z961 Presence of intraocular lens: Secondary | ICD-10-CM | POA: Diagnosis not present

## 2023-10-08 DIAGNOSIS — H353221 Exudative age-related macular degeneration, left eye, with active choroidal neovascularization: Secondary | ICD-10-CM | POA: Diagnosis not present

## 2023-10-13 ENCOUNTER — Other Ambulatory Visit: Payer: Self-pay | Admitting: Physician Assistant

## 2023-10-13 DIAGNOSIS — R600 Localized edema: Secondary | ICD-10-CM

## 2023-10-14 DIAGNOSIS — Z23 Encounter for immunization: Secondary | ICD-10-CM | POA: Diagnosis not present

## 2023-10-15 ENCOUNTER — Ambulatory Visit (HOSPITAL_COMMUNITY)
Admission: RE | Admit: 2023-10-15 | Discharge: 2023-10-15 | Disposition: A | Discharge status: Home / Self Care | Source: Ambulatory Visit | Attending: Hematology and Oncology | Admitting: Hematology and Oncology

## 2023-10-15 ENCOUNTER — Encounter (HOSPITAL_COMMUNITY): Payer: Self-pay

## 2023-10-15 DIAGNOSIS — R160 Hepatomegaly, not elsewhere classified: Secondary | ICD-10-CM | POA: Diagnosis not present

## 2023-10-15 DIAGNOSIS — C3431 Malignant neoplasm of lower lobe, right bronchus or lung: Secondary | ICD-10-CM | POA: Diagnosis not present

## 2023-10-15 DIAGNOSIS — R932 Abnormal findings on diagnostic imaging of liver and biliary tract: Secondary | ICD-10-CM | POA: Diagnosis not present

## 2023-10-15 DIAGNOSIS — J439 Emphysema, unspecified: Secondary | ICD-10-CM | POA: Diagnosis not present

## 2023-10-15 DIAGNOSIS — K5730 Diverticulosis of large intestine without perforation or abscess without bleeding: Secondary | ICD-10-CM | POA: Diagnosis not present

## 2023-10-15 MED ORDER — SODIUM CHLORIDE (PF) 0.9 % IJ SOLN
INTRAMUSCULAR | Status: AC
Start: 2023-10-15 — End: 2023-10-15
  Filled 2023-10-15: qty 50

## 2023-10-15 MED ORDER — IOHEXOL 300 MG/ML  SOLN
100.0000 mL | Freq: Once | INTRAMUSCULAR | Status: AC | PRN
  Administered 2023-10-15: 100 mL via INTRAVENOUS

## 2023-10-20 ENCOUNTER — Encounter: Payer: Self-pay | Admitting: Physical Medicine and Rehabilitation

## 2023-10-20 ENCOUNTER — Encounter: Attending: Physical Medicine and Rehabilitation | Admitting: Physical Medicine and Rehabilitation

## 2023-10-20 ENCOUNTER — Ambulatory Visit: Payer: Self-pay | Admitting: *Deleted

## 2023-10-20 VITALS — BP 137/88 | HR 82 | Ht 62.0 in

## 2023-10-20 DIAGNOSIS — G894 Chronic pain syndrome: Secondary | ICD-10-CM | POA: Diagnosis not present

## 2023-10-20 DIAGNOSIS — G43119 Migraine with aura, intractable, without status migrainosus: Secondary | ICD-10-CM | POA: Diagnosis not present

## 2023-10-20 DIAGNOSIS — M5417 Radiculopathy, lumbosacral region: Secondary | ICD-10-CM | POA: Diagnosis not present

## 2023-10-20 DIAGNOSIS — M21371 Foot drop, right foot: Secondary | ICD-10-CM | POA: Diagnosis not present

## 2023-10-20 DIAGNOSIS — G629 Polyneuropathy, unspecified: Secondary | ICD-10-CM

## 2023-10-20 DIAGNOSIS — R269 Unspecified abnormalities of gait and mobility: Secondary | ICD-10-CM | POA: Insufficient documentation

## 2023-10-20 DIAGNOSIS — R29898 Other symptoms and signs involving the musculoskeletal system: Secondary | ICD-10-CM

## 2023-10-20 DIAGNOSIS — E038 Other specified hypothyroidism: Secondary | ICD-10-CM | POA: Insufficient documentation

## 2023-10-20 NOTE — Progress Notes (Signed)
 Test Date:  10/20/2023  Patient: [name] DOB: [birth year] Physician: Joesph BROCKS. Emeline MD  Sex: Female Height: 5' 2 Ref Phys:   ID#: [id number] Weight: 182 lbs. Technician:    Patient Complaints: numbness in bilateral feet; R>L LE weakness for 6-8 months  Medications: Chemotherapy - Q3W - Azinutab  Patient History / Exam: RLE KE and DF 4/5; otherwise 5/5 bilateral LE BL LE sensory decrease less on RLE MTP  3x R hip surgeries; Last THA 2019 - no foot drop or nerve issues afterward. Does note R leg is shorter.   Mri Lumbar spine 05/2023 with  L disc protrusion at L3-4  closely approximating left L3 nerve root, and moderate to advanced left-sided foraminal narrowing at T9-10 through T11-12, with moderate left L5 foraminal stenosis.  NCV & EMG Findings: Evaluation of the left deep branch fibular (TA) motor nerve showed prolonged distal onset latency (Pop Fossa, 8.1 ms).  The left fibular (EDB) motor, the right fibular (EDB) motor, the left superficial fibular sensory, the right superficial fibular sensory, the left sural sensory, and the right sural sensory nerves showed no response.  The left tibial (AHB) motor and the right tibial (AHB) motor nerves showed reduced amplitude (L4.1, R2.2 mV) and decreased conduction velocity (L35, R33 m/s).    Needle evaluation of the left tibialis anterior muscle showed increased motor unit amplitude, increased motor unit duration, and moderately increased polyphasic potentials.  The left biceps femoris (short head) and the right biceps femoris (short head) muscles showed increased motor unit amplitude and slightly increased polyphasic potentials.  The right tibialis anterior muscle showed increased insertional activity, slightly increased spontaneous activity, increased motor unit amplitude, increased motor unit duration, moderately increased polyphasic potentials, and early recruitment.  All remaining muscles (as indicated in the following table)  showed no evidence of electrical instability.    Impression: This is an abnormal study. 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 Haruki Arnold MD    Nerve Conduction Studies Motor Nerve Results    Latency Amplitude F-Lat Segment Distance CV Comment  Site (ms) Norm (mV) Norm (ms)  (cm) (m/s) Norm   Left Fibular (EDB)  Ankle -  < 6.1 -  > 2.0        Bel Fib Head *NR - *NR -  Bel Fib Head-Ankle - *NR  > 38   Left Fibular (Tib Anterior)  Fib Head 3.5  < 4.2 1.55 -        Pop Fossa *8.1  < 5.7 0.41 -  Pop Fossa-Fib Head 16 35 -   Right Fibular (EDB)  Ankle *NR  < 6.1 *NR  > 2.0        Right Fibular (Tib Anterior)  Fib Head -  < 4.2 - -        Left Tibial (AHB)  Ankle 4.7  < 6.1 *4.1  > 4.4 25.0       Knee 15.9 - 2.9 -  Knee-Ankle 39 *35  > 35   Right Tibial (AHB)  Ankle 4.8  < 6.1 *2.2  > 4.4 32.5       Knee 16.7 - 0.12 -  Knee-Ankle 39 *33  > 35    Sensory Nerve Results    Latency (Peak) Amplitude (P-P) Segment Distance CV Comment  Site (ms) Norm (  V) Norm  (cm) (m/s) Norm   Left Sural  Calf-Lat Mall *NR  < 4.0 *NR  > 5 Calf-Lat Mall 14 *NR  > 35   Right Sural  Calf-Lat Mall *NR  < 4.0 *NR  > 5 Calf-Lat Mall 14 *NR  > 35   Left Superficial Fibular  14 cm-Ankle *NR - *NR  > 5 14 cm-Ankle 14 *NR  > 32   Right Superficial Fibular  14 cm-Ankle *NR - *NR  > 5 14 cm-Ankle 14 *NR  > 32     Electromyography   Side Muscle Nerve Root Ins Act Fibs Psw Amp Dur Poly Recrt Int Pat Comment  Left Tib Anterior Fibular,  Deep Fibula... L4-L5 Nml Nml Nml *Incr *>77ms *2+ Nml Nml   Left Gastroc Tibial S1-S2 Nml Nml Nml Nml Nml 0 Nml Nml   Left Tib Posterior Tibial L5-S1 Nml Nml Nml Nml Nml 0 Nml Nml   Left Biceps Fem SH Sciatic L5-S1 Nml  Nml Nml *Incr Nml *1+ Nml Nml   Left Vastus Med Femoral L2-L4 Nml Nml Nml Nml Nml 0 Nml Nml   Left Gluteus Med Sup Gluteal L5-S1 Nml Nml Nml Nml Nml 0 Nml Nml   Left L2 Parasp Rami L2 Nml Nml Nml        Left L4 Parasp Rami L4 Nml Nml Nml        Left S1 Parasp Rami S1 Nml Nml Nml        Right Tib Anterior Fibular,  Deep Fibula... L4-L5 *Incr *1+ *1+ *Incr *>31ms *2+ *Rapid Nml   Right Gastroc Tibial S1-S2 Nml Nml Nml Nml Nml 0 Nml Nml   Right Tib Posterior Tibial L5-S1 Nml Nml Nml Nml Nml 0 Nml Nml   Right Biceps Fem SH Sciatic L5-S1 Nml Nml Nml *Incr Nml *1+ Nml Nml   Right Vastus Med Femoral L2-L4 Nml Nml Nml Nml Nml 0 Nml Nml   Right L2 Parasp Rami L2 Nml Nml Nml        Right L4 Parasp Rami L4 Nml Nml Nml        Right S1 Parasp Rami S1 Nml Nml Nml           NCS Waveforms:  Motor           F-Wave      Sensory

## 2023-10-20 NOTE — Telephone Encounter (Signed)
-----   Message from Norleen ONEIDA Kidney IV sent at 10/18/2023  4:22 PM EDT ----- Please let Ms. Anselmo know that her CT scan showed no evidence of residual or recurrent disease.  We will continue her small cell lung cancer treatment as scheduled. ----- Message ----- From: Rebecka, Rad Results In Sent: 10/18/2023   3:46 PM EDT To: Norleen ONEIDA Kidney [place], MD

## 2023-10-20 NOTE — Telephone Encounter (Signed)
 TCT patient regarding recent CT scan results. No answer but was able to leave detailed vm message on her identified phone vm. Advised that her CT scan showed no evidence of residual or recurrent disease. We will continue her small cell lung cancer treatment as scheduled. Advised to call back to [phone] with any questions or concerns.

## 2023-10-20 NOTE — Progress Notes (Unsigned)
 [facility] [phone]   [phone]  PROGRESS NOTE  Patient Care Team: [name], FNP as PCP - General (Family Medicine) O'Neal, Darryle Ned, MD as PCP - Cardiology (Cardiology) [name], MD (Inactive) as Attending Physician (Gastroenterology) [name], MD as Consulting Physician (Orthopedic Surgery) [name], MD as Consulting Physician (Orthopedic Surgery) [name], MD (Inactive) as Consulting Physician (Neurology) [name], MD as Consulting Physician (Neurology) [name], [facility] (Inactive) (Pharmacist) [facility]ll[facility], [facility]   Hematological/Oncological History # Small Cell Lung Cancer, Extensive Stage 07/05/2020: CT abdomen for lower abdominal pain. New pulmonary nodular density noted 07/06/2020: CT chest showed 2.2 cm macrolobulated right lower lobe pulmonary nodule (favored) versus pathologically enlarged infrahilar lymph node 10/13/2020: PET CT scan performed, findings show 2 cm right lower lobe lung mass is hypermetabolic and consistent with primary lung neoplasm. Additionally found hypermetabolic 17 mm soft tissue lesion between the descending duodenum and the pancreatic head  10/19/2020: EGD to biopsy hypermetabolic lymph node. Biopsy results show small cell lung cancer 10/26/2020: establish care with Dr. Federico  11/13/2020: Cycle 1 Day 1 of Carbo/Etop/Atezolizumab  12/04/2020: Cycle 2 Day 1 of Carbo/Etop/Atezolizumab  11/22-11/25/2022: admitted for E. Coli bacteremia/sepsis. Start of Cycle 3 delayed. 01/02/2021: Cycle 3 Day 1 of Carbo/Etop/Atezolizumab  01/23/2021: Cycle 4 Day 1 of Carbo/Etop/Atezolizumab  02/22/2021: Cycle 5 Day 1 of Atezolizumab  Maintenance. Delayed start due to patient's COVID infection.  03/14/2021: Cycle 6 Day 1 of Atezolizumab  Maintenance 04/05/2021: Cycle 7 Day 1 of Atezolizumab  Maintenance 04/25/2021: Cycle 8 Day 1 of Atezolizumab   Maintenance 05/16/2021: Cycle 9 Day 1 of Atezolizumab  Maintenance 06/06/2021: Cycle 10 Day 1 of Atezolizumab  Maintenance 06/27/2021:  Cycle 11 Day 1 of Atezolizumab  Maintenance 07/18/2021: Cycle 12 Day 1 of Atezolizumab  Maintenance 08/08/2021: Cycle 13 Day 1 of Atezolizumab  Maintenance 08/29/2021: Cycle 14 Day 1 of Atezolizumab  Maintenance 09/19/2021: Cycle 15 Day 1 of Atezolizumab  Maintenance 10/10/2021: Cycle 16 Day 1 of Atezolizumab  Maintenance 10/31/2021: Cycle 17 Day 1 of Atezolizumab  Maintenance 11/21/2021: Cycle 18 Day 1 of Atezolizumab  Maintenance 12/12/2021: Cycle 19 Day 1 of Atezolizumab  Maintenance 01/02/2022: Cycle 20 Day 1 of Atezolizumab  Maintenance 01/23/2022: Cycle 21 Day 1 of Atezolizumab  Maintenance 02/13/2022: treatment HELD due to diarrhea.  03/06/2022: Cycle 22 Day 1 of Atezolizumab  Maintenance 03/27/2022: Cycle 23 Day 1 of Atezolizumab  Maintenance 04/17/2022: Cycle 24 Day 1 of Atezolizumab  Maintenance 05/08/2022:  Cycle 25 Day 1 of Atezolizumab  Maintenance (Held due to patient preference for treatment holiday) 05/29/2022: Cycle 25 Day 1 of Atezolizumab  Maintenance  06/19/2022: Cycle 26 Day 1 of Atezolizumab  Maintenance  07/12/2022: Cycle 27 Day 1 of Atezolizumab  Maintenance 07/31/2022: Cycle 28 Day 1 of Atezolizumab  Maintenance HELD per patient request for fatigue.  08/22/2022: Cycle 28 Day 1 of Atezolizumab  Maintenance 09/11/2022: Cycle 29 Day 1 of Atezolizumab  Maintenance 10/03/2022: Cycle 30 Day 1 of Atezolizumab  Maintenance 10/23/2022: Cycle 31 Day 1 of Atezolizumab  Maintenance 11/13/2022: Cycle 32 Day 1 of Atezolizumab  Maintenance 12/24/2022: Cycle 33 Day 1 of Atezolizumab  Maintenance 01/15/2023: Cycle 34 Day 1 of Atezolizumab  Maintenance 02/05/2023: Cycle 35 Day 1 of Atezolizumab  Maintenance 02/26/2023: Cycle 36 Day 1 of Atezolizumab  Maintenance 04/28/2023: Cycle 37 Day 1 of Atezolizumab  Maintenance 04/30/2023: Cycle 38 Day 1 of Atezolizumab  Maintenance 05/21/2023: Cycle 39 Day 1 of  Atezolizumab  Maintenance 06/11/2023: Cycle 40 Day 1 of Atezolizumab  Maintenance 07/28/2023: Cycle 41 Day 1 of Atezolizumab  Maintenance 08/18/2023: Cycle 42 Day 1 of Atezolizumab  Maintenance 09/08/2023: Cycle 43 Day 1 of Atezolizumab  Maintenance 09/30/2023:  Cycle  44 Day 1 of Atezolizumab  Maintenance 10/20/2023: Cycle 45 Day 1 of Atezolizumab  Maintenance  Interval History:  [name] 68 y.o. female with medical history significant for extensive stage small cell lung cancer who presents for a follow up visit. The patient's last visit was on 09/30/2023.  She presents today to start cycle 45 of maintenance atezolizumab . She is unaccompanied for this visit.   On exam today [name] reports her energy levels are overall stable. She is having more headaches due to stress at home. She reports decreased appetite. Her weight fluctuates over the past 2-3 months between 179 to 192.  She denies nausea, vomiting or bowel habit changes. She denies easy bruising or signs of active bleeding. She reports worsening low back pain that she rates as 8 out of 10 on a pain scale. She denies fevers, chills, sweats, shortness of breath, chest pain or cough. She has no other complaints. Rest of the ROS is below.    MEDICAL HISTORY:  Past Medical History:  Diagnosis Date   Allergic rhinitis    Anemia    Anxiety    Chicken pox    Chronic back pain    COPD (chronic obstructive pulmonary disease) (HCC)    Depression    DM (diabetes mellitus) (HCC)    Essential hypertension    GERD (gastroesophageal reflux disease)    Headache    migraines   History of gastritis    EGD 2015   History of home oxygen  therapy    2 liters at hs last 6 months   Hyperlipidemia    Hypothyroidism    Migraines    Osteoarthritis    oa   Scoliosis    Small cell lung cancer (HCC)    Stage 4    SURGICAL HISTORY: Past Surgical History:  Procedure Laterality Date   APPENDECTOMY     1985   BIOPSY  07/24/2017   Procedure: BIOPSY;   Surgeon: [name], MD;  Location: [facility];  Service: Endoscopy;;   CARDIAC CATHETERIZATION N/A 10/31/2015   Procedure: Left Heart Cath and Coronary Angiography;  Surgeon: Alm LELON Clay, MD;  Location: [facility] INVASIVE CV LAB;  Service: Cardiovascular;  Laterality: N/A;   CARPAL TUNNEL RELEASE Left    CARPAL TUNNEL RELEASE Right    CATARACT EXTRACTION W/PHACO Left 02/07/2023   Procedure: CATARACT EXTRACTION PHACO AND INTRAOCULAR LENS PLACEMENT (IOC);  Surgeon: Juli Blunt, MD;  Location: [facility];  Service: Ophthalmology;  Laterality: Left;  CDE: 3.51   CATARACT EXTRACTION W/PHACO Right 02/21/2023   Procedure: CATARACT EXTRACTION PHACO AND INTRAOCULAR LENS PLACEMENT (IOC);  Surgeon: Juli Blunt, MD;  Location: [facility];  Service: Ophthalmology;  Laterality: Right;  CDE: 2.94   CHOLECYSTECTOMY  late 1980's   COLONOSCOPY WITH PROPOFOL  N/A 07/24/2017   Procedure: COLONOSCOPY WITH PROPOFOL ;  Surgeon: [name], MD;  Location: [facility];  Service: Endoscopy;  Laterality: N/A;   ESOPHAGOGASTRODUODENOSCOPY N/A 07/24/2017   Procedure: ESOPHAGOGASTRODUODENOSCOPY (EGD);  Surgeon: [name], MD;  Location: [facility];  Service: Endoscopy;  Laterality: N/A;   ESOPHAGOGASTRODUODENOSCOPY (EGD) WITH PROPOFOL  N/A 10/19/2020   Procedure: ESOPHAGOGASTRODUODENOSCOPY (EGD) WITH PROPOFOL ;  Surgeon: [name], MD;  Location: [facility];  Service: Endoscopy;  Laterality: N/A;   FINE NEEDLE ASPIRATION N/A 10/19/2020   Procedure: FINE NEEDLE ASPIRATION (FNA) LINEAR;  Surgeon: [name], MD;  Location: [facility];  Service: Endoscopy;  Laterality: N/A;   GALLBLADDER SURGERY  1991   HIP CLOSED REDUCTION Right 01/08/2016  Procedure: CLOSED MANIPULATION HIP;  Surgeon: Reyes Billing, MD;  Location: [facility];  Service: Orthopedics;  Laterality: Right;   HIP CLOSED REDUCTION Right 01/19/2016   Procedure: ATTEMPTED CLOSED REDUCTION RIGHT HIP;  Surgeon: Norleen Armor, MD;  Location:  [facility];  Service: Orthopedics;  Laterality: Right;   HIP CLOSED REDUCTION Right 01/20/2016   Procedure: CLOSED REDUCTION RIGHT TOTAL HIP;  Surgeon: Donnice Car, MD;  Location: [facility];  Service: Orthopedics;  Laterality: Right;   HIP CLOSED REDUCTION Right 02/17/2016   Procedure: CLOSED REDUCTION RIGHT TOTAL HIP;  Surgeon: Redell Shoals, MD;  Location: [facility];  Service: Orthopedics;  Laterality: Right;   HIP CLOSED REDUCTION Right 02/28/2016   Procedure: CLOSED REDUCTION HIP;  Surgeon: Selinda Belvie Gosling, MD;  Location: [facility];  Service: Orthopedics;  Laterality: Right;   IR IMAGING GUIDED PORT INSERTION  11/01/2020   POLYPECTOMY  07/24/2017   Procedure: POLYPECTOMY;  Surgeon: [name], MD;  Location: [facility];  Service: Endoscopy;;   TONSILLECTOMY     TOTAL ABDOMINAL HYSTERECTOMY     1985, with 1 ovary removed and 2 nd ovary removed 2003   TOTAL HIP ARTHROPLASTY Right    Original surgery 2006 with revision 2010   TOTAL HIP REVISION Right 01/01/2016   Procedure: TOTAL HIP REVISION;  Surgeon: Donnice Car, MD;  Location: [facility];  Service: Orthopedics;  Laterality: Right;   TOTAL HIP REVISION Right 03/02/2016   Procedure: TOTAL HIP REVISION;  Surgeon: Car Donnice, MD;  Location: [facility];  Service: Orthopedics;  Laterality: Right;   TOTAL HIP REVISION Right 09/02/2016   Procedure: Right hip constrained liner- posterior;  Surgeon: Car Donnice, MD;  Location: [facility];  Service: Orthopedics;  Laterality: Right;   ULNAR NERVE TRANSPOSITION Right    UPPER ESOPHAGEAL ENDOSCOPIC ULTRASOUND (EUS) N/A 10/19/2020   Procedure: UPPER ESOPHAGEAL ENDOSCOPIC ULTRASOUND (EUS);  Surgeon: [name], MD;  Location: [facility];  Service: Endoscopy;  Laterality: N/A;  periduodenal lesion    SOCIAL HISTORY: Social History   Socioeconomic History   Marital status: Married    Spouse name: Not on file   Number of children: 2   Years of education: Not on file   Highest education level: 12th grade   Occupational History   Occupation: disabled   Occupation: disabled  Tobacco Use   Smoking status: Every Day    Current packs/day: 2.00    Average packs/day: 2.0 packs/day for 46.0 years (92.0 ttl pk-yrs)    Types: Cigarettes   Smokeless tobacco: Never   Tobacco comments:    2 packs of cigarettes smoked daily 12/13/21- declines smoking cessation  Vaping Use   Vaping status: Never Used  Substance and Sexual Activity   Alcohol use: No   Drug use: No   Sexual activity: Not Currently    Partners: Male  Other Topics Concern   Not on file  Social History Narrative   Right handed    Caffeine~ 2 cups per day    Lives at home with husband (strained relationship)   Primary caretaker for disabled brother who had aneurism   Daughter died 06/29/18   Social Drivers of Corporate investment banker Strain: Low Risk  (07/31/2023)   Received from [facility]   Overall Financial Resource Strain (CARDIA)    How hard is it for you to pay for the very basics like food, housing, medical care, and heating?: Not very hard  Food Insecurity: No Food Insecurity (07/31/2023)  Received from [facility]   Hunger Vital Sign    Within the past 12 months, you worried that your food would run out before you got the money to buy more.: Never true    Within the past 12 months, the food you bought just didn't last and you didn't have money to get more.: Never true  Recent Concern: Food Insecurity - Food Insecurity Present (05/20/2023)   Hunger Vital Sign    Worried About Running Out of Food in the Last Year: Sometimes true    Ran Out of Food in the Last Year: Sometimes true  Transportation Needs: No Transportation Needs (07/31/2023)   Received from [facility]   PRAPARE - Transportation    Lack of Transportation (Medical): No    Lack of Transportation (Non-Medical): No  Physical Activity: Inactive (05/20/2023)   Exercise Vital Sign    Days of Exercise per Week: 0 days    Minutes of Exercise per  Session: 0 min  Stress: No Stress Concern Present (05/20/2023)   [employer] of Occupational Health - Occupational Stress Questionnaire    Feeling of Stress : Only a little  Recent Concern: Stress - Stress Concern Present (03/31/2023)   [employer] of Occupational Health - Occupational Stress Questionnaire    Feeling of Stress : Very much  Social Connections: Moderately Integrated (05/20/2023)   Social Connection and Isolation Panel    Frequency of Communication with Friends and Family: More than three times a week    Frequency of Social Gatherings with Friends and Family: More than three times a week    Attends Religious Services: 1 to 4 times per year    Active Member of [employer] or Organizations: No    Attends Banker Meetings: Never    Marital Status: Married  Catering manager Violence: Not At Risk (07/31/2023)   Received from [facility]   Humiliation, Afraid, Rape, and Kick questionnaire    Within the last year, have you been afraid of your partner or ex-partner?: No    Within the last year, have you been humiliated or emotionally abused in other ways by your partner or ex-partner?: No    Within the last year, have you been kicked, hit, slapped, or otherwise physically hurt by your partner or ex-partner?: No    Within the last year, have you been raped or forced to have any kind of sexual activity by your partner or ex-partner?: No    FAMILY HISTORY: Family History  Problem Relation Age of Onset   COPD Mother    Heart disease Mother    Lung disease Father        Asbestosis   Heart attack Father    Heart disease Father    Cerebral aneurysm Brother    Aneurysm Brother        Brain   Drug abuse Daughter    Epilepsy Son    Alcohol abuse Son    Drug abuse Son    Arthritis Maternal Grandmother    Heart disease Maternal Grandmother    Asthma Maternal Grandfather    Cancer Maternal Grandfather    Arthritis Paternal Grandmother    Heart disease Paternal  Grandmother    Stroke Paternal Grandmother    Early death Paternal Grandfather    Heart disease Paternal Grandfather     ALLERGIES:  is allergic to nisoldipine, metformin  and related, nsaids, pregabalin, wellbutrin  [bupropion ], aleve [naproxen sodium], penicillins, and sulfonamide derivatives.  MEDICATIONS:  Current Outpatient Medications  Medication Sig Dispense Refill   albuterol  (PROAIR  HFA) 108 (90 Base) MCG/ACT inhaler 2 puffs every 4 hours as needed only  if your can't catch your breath 18 g 5   ALPRAZolam  (XANAX ) 1 MG tablet TAKE 1 TABLET BY MOUTH THREE TIMES DAILY AS NEEDED FOR ANXIETY 90 tablet 2   ARIPiprazole  (ABILIFY ) 5 MG tablet Take 1 tablet (5 mg total) by mouth daily. 30 tablet 5   atorvastatin  (LIPITOR) 20 MG tablet TAKE 1 TABLET BY MOUTH AT BEDTIME 90 tablet 1   benzonatate  (TESSALON ) 100 MG capsule TAKE ONE CAPSULE BY MOUTH THREE TIMES DAILY 90 capsule 1   calcipotriene  (DOVONOX) 0.005 % cream Apply to affected area bid when on break from steroid cream 120 g 3   cyclobenzaprine  (FLEXERIL ) 10 MG tablet Take 10 mg by mouth 3 (three) times daily as needed.     dicyclomine  (BENTYL ) 10 MG capsule Take 1 capsule (10 mg total) by mouth every 8 (eight) hours as needed for spasms. Please keep your August appointment for further refills. Thank you 60 capsule 1   diphenoxylate -atropine  (LOMOTIL ) 2.5-0.025 MG tablet TAKE 1 TABLET BY MOUTH FOUR TIMES DAILY AS NEEDED FOR diarrhea OR loose stools 60 tablet 0   DULoxetine  (CYMBALTA ) 60 MG capsule TAKE TWO CAPSULES BY MOUTH EVERY DAY 180 capsule 1   ferrous sulfate  325 (65 FE) MG EC tablet TAKE 1 TABLET BY MOUTH DAILY WITH BREAKFAST 30 tablet 3   fluconazole  (DIFLUCAN ) 150 MG tablet Take 150 mg by mouth once.     fluticasone  (FLONASE ) 50 MCG/ACT nasal spray INSTILL 2 SPRAYS IN EACH NOSTRIL EVERY DAY 16 g 6   furosemide  (LASIX ) 20 MG tablet TAKE 1 TABLET BY MOUTH TWICE DAILY, IF SWELLING IMPROVES CAN HOLD MEDICATION 90 tablet 0    HYDROcodone -acetaminophen  (NORCO) 10-325 MG tablet Take 1 tablet by mouth every 4 (four) hours as needed. To fill 08/29/23- 180 tablet 0   HYDROcodone -acetaminophen  (NORCO) 10-325 MG tablet Take 1 tablet by mouth every 4 (four) hours as needed. 180 tablet 0   hydrOXYzine  (ATARAX ) 10 MG tablet TAKE 1 TABLET BY MOUTH THREE TIMES DAILY AS NEEDED FOR ITCHING 30 tablet 2   levothyroxine  (SYNTHROID ) 137 MCG tablet Take 1 tablet (137 mcg total) by mouth daily before breakfast. 90 tablet 3   lidocaine -prilocaine  (EMLA ) cream Apply 1 Application topically as needed. 30 g 2   loratadine  (CLARITIN ) 10 MG tablet Take 10 mg by mouth daily.     meclizine  (ANTIVERT ) 12.5 MG tablet Take 1 tablet (12.5 mg total) by mouth 2 (two) times daily. 60 tablet 1   meloxicam  (MOBIC ) 15 MG tablet TAKE 1 TABLET BY MOUTH EVERY DAY 30 tablet 2   NARCAN  4 MG/0.1ML LIQD nasal spray kit SMARTSIG:Both Nares     nystatin  (MYCOSTATIN ) 100000 UNIT/ML suspension      OLANZapine  (ZYPREXA ) 10 MG tablet Take 1 tablet (10 mg total) by mouth at bedtime. 30 tablet 2   omeprazole  (PRILOSEC) 40 MG capsule Take 1 capsule (40 mg total) by mouth 2 (two) times daily. 180 capsule 3   ondansetron  (ZOFRAN ) 8 MG tablet TAKE 1 TABLET BY MOUTH TWICE DAILY 60 tablet 1   permethrin  (ELIMITE ) 5 % cream      potassium chloride  SA (KLOR-CON  M) 20 MEQ tablet TAKE 1 TABLET BY MOUTH TWICE DAILY 60 tablet 2   prochlorperazine  (COMPAZINE ) 10 MG tablet Take 1 tablet (10 mg total) by mouth every 6 (six) hours as needed for nausea or vomiting. 30  tablet 0   Rimegepant Sulfate (NURTEC) 75 MG TBDP TAKE 1 TABLET BY MOUTH DAILY AS NEEDED FOR MIGRAINE, take WITHIN 15 MINUTES of initial symptoms 8 tablet 1   topiramate  (TOPAMAX ) 100 MG tablet TAKE 1 TABLET BY MOUTH AT BEDTIME 90 tablet 1   triamcinolone  ointment (KENALOG ) 0.5 % Apply 1 Application topically 3 (three) times daily. Apply thin-layer on affected areas, no more than 2 weeks consistently. 60 g 2    triamterene -hydrochlorothiazide  (MAXZIDE -25) 37.5-25 MG tablet Take 1 tablet by mouth daily.     umeclidinium-vilanterol (ANORO ELLIPTA ) 62.5-25 MCG/ACT AEPB Inhale 1 puff into the lungs daily. 60 each 11   No current facility-administered medications for this visit.    REVIEW OF SYSTEMS:   Constitutional: ( - ) fevers, ( - )  chills , ( - ) night sweats Eyes: ( - ) blurriness of vision, ( - ) double vision, ( - ) watery eyes Ears, nose, mouth, throat, and face: ( - ) mucositis, ( - ) sore throat Respiratory: ( - ) cough, ( -) dyspnea, ( - ) wheezes Cardiovascular: ( - ) palpitation, ( - ) chest discomfort, ( + ) lower extremity swelling Gastrointestinal:  ( -) nausea, ( - ) heartburn, ( - ) change in bowel habits Skin: ( - ) abnormal skin rashes Lymphatics: ( - ) new lymphadenopathy, ( - ) easy bruising Neurological: (+ ) numbness, ( - ) tingling, ( - ) new weaknesses Behavioral/Psych: ( - ) mood change, ( - ) new changes  All other systems were reviewed with the patient and are negative.  PHYSICAL EXAMINATION: ECOG PERFORMANCE STATUS: 1 - Symptomatic but completely ambulatory  There were no vitals filed for this visit.    There were no vitals filed for this visit.     GENERAL: Well-appearing middle-age Caucasian female, alert, no distress and comfortable SKIN: skin color, texture, turgor are normal, no rashes or significant lesions EYES: conjunctiva are pink and non-injected, sclera clear LUNGS:  normal breathing effort. Diffuse wheezing hear on ausculation.  HEART: regular rate & rhythm and no murmurs. +1 bilateral pitting lower extremity edema starting mid calf. Musculoskeletal: no cyanosis of digits and no clubbing  PSYCH: alert & oriented x 3, fluent speech NEURO: no focal motor/sensory deficits  LABORATORY DATA:  I have reviewed the data as listed    Latest Ref Rng & Units 09/30/2023   10:48 AM 09/08/2023   10:51 AM 08/18/2023   10:15 AM  CBC  WBC 4.0 - 10.5 K/uL  8.8  7.1  7.5   Hemoglobin 12.0 - 15.0 g/dL 86.3  86.6  86.4   Hematocrit 36.0 - 46.0 % 40.7  39.8  41.2   Platelets 150 - 400 K/uL 181  154  181        Latest Ref Rng & Units 09/30/2023   10:48 AM 09/08/2023   10:51 AM 08/18/2023   10:15 AM  CMP  Glucose 70 - 99 mg/dL 86  92  96   BUN 8 - 23 mg/dL 12  16  13    Creatinine 0.44 - 1.00 mg/dL 8.88  8.70  8.77   Sodium 135 - 145 mmol/L 135  135  139   Potassium 3.5 - 5.1 mmol/L 3.9  3.6  3.9   Chloride 98 - 111 mmol/L 101  100  105   CO2 22 - 32 mmol/L 27  30  28    Calcium  8.9 - 10.3 mg/dL 8.4  8.4  8.4   Total  Protein 6.5 - 8.1 g/dL 6.1  5.9  6.1   Total Bilirubin 0.0 - 1.2 mg/dL 0.4  0.4  0.4   Alkaline Phos 38 - 126 U/L 79  69  85   AST 15 - 41 U/L 16  15  13    ALT 0 - 44 U/L 13  10  8      No results found for: MPROTEIN Lab Results  Component Value Date   KPAFRELGTCHN 0.75 06/30/2014   LAMBDASER 3.78 (H) 06/30/2014   KAPLAMBRATIO 0.20 (L) 06/30/2014     RADIOGRAPHIC STUDIES: CT CHEST ABDOMEN PELVIS W CONTRAST Result Date: 10/18/2023 CLINICAL DATA:  History of lung cancer, follow-up. * Tracking Code: BO * EXAM: CT CHEST, ABDOMEN, AND PELVIS WITH CONTRAST TECHNIQUE: Multidetector CT imaging of the chest, abdomen and pelvis was performed following the standard protocol during bolus administration of intravenous contrast. RADIATION DOSE REDUCTION: This exam was performed according to the departmental dose-optimization program which includes automated exposure control, adjustment of the mA and/or kV according to patient size and/or use of iterative reconstruction technique. CONTRAST:  OMNIPAQUE  IOHEXOL  300 MG/ML  SOLN COMPARISON:  Multiple priors including CT April 17, 2023 FINDINGS: CT CHEST FINDINGS Cardiovascular: Right chest Port-A-Cath with the tip in the right atrium. Aortic atherosclerosis. Normal size heart. No significant pericardial effusion/thickening. Coronary artery calcifications. Mediastinum/Nodes: No suspicious  thyroid  nodule. No pathologically enlarged mediastinal, hilar or axillary lymph nodes. The esophagus is grossly unremarkable. Lungs/Pleura: No evidence of recurrence of the previously treated perihilar right lower lobe pulmonary neoplasm. No new suspicious pulmonary nodules or masses. Scattered atelectasis/scarring. Apical predominance paraseptal emphysema. Musculoskeletal: No aggressive lytic or blastic lesion of bone. Thoracic spondylosis. CT ABDOMEN PELVIS FINDINGS Hepatobiliary: No suspicious hepatic lesion. Slight enlargement of the lateral left lobe of the liver with questionable contour nodularity for instance on image 50/2. Gallbladder surgically absent. Similar prominence of the biliary tree favored reservoir effect post cholecystectomy. Pancreas: No pancreatic ductal dilation or evidence of acute inflammation. Spleen: No splenomegaly. Adrenals/Urinary Tract: 10 mm left adrenal nodule has been present over multiple prior imaging evaluations and was not hypermetabolic on PET-CT October 13, 2020 compatible with a benign adenoma. Right adrenal gland appears normal. No hydronephrosis. 16 mm right interpolar renal lesion measures Hounsfield units greater than that expected for a simple cyst on image 109/4 but incompletely evaluated on this examination but was previously characterized as a benign hemorrhagic/proteinaceous cyst on MRI 05/22/2022. Urinary bladder is unremarkable for degree of distension within limitation of streak artifact from right hip arthroplasty. Stomach/Bowel: Stomach is nondistended limiting evaluation. No pathologic dilation of small or large bowel. Colonic stool burden compatible with constipation. Sigmoid colonic diverticulosis. Vascular/Lymphatic: Aortic and branch vessel atherosclerosis. Smooth IVC contours. Mixing artifact in the IVC at the level of the renal veins. No pathologically enlarged abdominal or pelvic lymph nodes. Reproductive: Status post hysterectomy. No adnexal masses.  Other: No significant abdominopelvic free fluid. Musculoskeletal: No aggressive lytic or blastic lesion of bone. Levoconvex curvature of the lumbar spine with associated degenerative change. Right total hip arthroplasty. Degenerative change of the left hip. Chronic osseous changes of the pubic symphysis. Degenerative change of the bilateral SI joints. IMPRESSION: 1. No evidence of recurrence of the previously treated perihilar right lower lobe pulmonary neoplasm. 2. No evidence of metastatic disease within the chest, abdomen or pelvis. 3. Slight enlargement of the lateral left lobe of the liver with questionable contour nodularity, suggest correlation with laboratory values for hepatocellular dysfunction. 4. Colonic stool burden compatible with  constipation. 5. Sigmoid colonic diverticulosis without evidence of acute diverticulitis. Aortic Atherosclerosis (ICD10-I70.0) and Emphysema (ICD10-J43.9). Electronically Signed   By: Reyes Holder M.D.   On: 10/18/2023 15:43      ASSESSMENT & PLAN [name] is a 68 y.o. female with medical history significant for extensive stage small cell lung cancer who presents for a follow up visit.      # Small Cell Lung Cancer, Extensive Stage -- MRI of the brain shows no evidence of intracranial spread --Findings are currently consistent with metastatic small cell lung cancer with metastatic spread to the lymph nodes of the abdomen -- Started carboplatin , etoposide , and atezolizumab  on 11/13/2020. Transitioned to maintenance Atezolizumab  on 02/22/2021.  Plan: --today is Cycle 45 Day 1 of Atezolizumab  maintenance.  --labs today were reviewed and adequate for treatment. WBC 8.0, Hgb 14.9, Plt 169, creatinine and LFTs are normal.  -- Most recent CT CAP from 10/15/2023 which shows no evidence of recurrence. Repeat scan due in December 2025.  --Proceed with treatment without any dose modifications.  --RTC in 3 weeks for a follow up before Cycle 46  #Migraines:   --chronic and recently triggered by stress. --will give toradol  injection today.   # Early Cirrhosis --noted on MR abdomen from 05/22/2022. US  with elastography performed on 06/18/2022. --Most recent CT scan from 10/15/2023 showed slight enlargement of lateral left lobe of liver with questionable contour nodularity. Dr. Leigh reports repeat RUQ US  in 6 months (March 2026).  #Right renal lesion: --Noted to be benign on most recent MRI of the abdomen.  #Diarrhea-improved --stool sent for C. Diff and GI pathology panel, all negative --patient following with GI and found to have pancreatic insufficiency and started on creon  with improvement of symptoms.   #Nausea: --Symptoms improved with zofran  and olanzapine .  --Added IV zofran  to infusion premeds for better symptom control.   #Hypokalemia:  --currently taking potassium chloride  20 meq BID.   --potassium level is 3.8 today. Continue PO supplementation.   #Lower extremity edema --Currently takes lasix  20 mg once daily day. --Advised to increase Lasix  to twice daily and wear compression stockings.   #Neuropathy involving fingers/feet: --Patient currently takes Cymbalta    #Supportive Care -- chemotherapy education complete -- port placed -- zofran  8mg  q8H PRN and compazine  10mg  PO q6H for nausea -- EMLA  cream for port  No orders of the defined types were placed in this encounter.  All questions were answered. The patient knows to call the clinic with any problems, questions or concerns.  I have spent a total of 30 minutes minutes of face-to-face and non-face-to-face time, preparing to see the patient, performing a medically appropriate examination, counseling and educating the patient, ordering medications/tests, documenting clinical information in the electronic health record, and care coordination.    Johnston Police PA-C Dept of Hematology and Oncology [facility] Phone:  [phone]    10/20/2023 10:48 PM

## 2023-10-21 ENCOUNTER — Inpatient Hospital Stay (HOSPITAL_BASED_OUTPATIENT_CLINIC_OR_DEPARTMENT_OTHER): Admitting: Physician Assistant

## 2023-10-21 ENCOUNTER — Inpatient Hospital Stay

## 2023-10-21 ENCOUNTER — Inpatient Hospital Stay: Admitting: Dietician

## 2023-10-21 VITALS — BP 162/82 | HR 88 | Temp 98.1°F | Resp 16 | Wt 179.6 lb

## 2023-10-21 DIAGNOSIS — I2510 Atherosclerotic heart disease of native coronary artery without angina pectoris: Secondary | ICD-10-CM | POA: Diagnosis not present

## 2023-10-21 DIAGNOSIS — E039 Hypothyroidism, unspecified: Secondary | ICD-10-CM | POA: Diagnosis not present

## 2023-10-21 DIAGNOSIS — R600 Localized edema: Secondary | ICD-10-CM | POA: Diagnosis not present

## 2023-10-21 DIAGNOSIS — J438 Other emphysema: Secondary | ICD-10-CM | POA: Diagnosis not present

## 2023-10-21 DIAGNOSIS — G43809 Other migraine, not intractable, without status migrainosus: Secondary | ICD-10-CM

## 2023-10-21 DIAGNOSIS — E119 Type 2 diabetes mellitus without complications: Secondary | ICD-10-CM | POA: Diagnosis not present

## 2023-10-21 DIAGNOSIS — Z5112 Encounter for antineoplastic immunotherapy: Secondary | ICD-10-CM | POA: Diagnosis not present

## 2023-10-21 DIAGNOSIS — G8929 Other chronic pain: Secondary | ICD-10-CM | POA: Diagnosis not present

## 2023-10-21 DIAGNOSIS — K5730 Diverticulosis of large intestine without perforation or abscess without bleeding: Secondary | ICD-10-CM | POA: Diagnosis not present

## 2023-10-21 DIAGNOSIS — K7460 Unspecified cirrhosis of liver: Secondary | ICD-10-CM | POA: Diagnosis not present

## 2023-10-21 DIAGNOSIS — M1990 Unspecified osteoarthritis, unspecified site: Secondary | ICD-10-CM | POA: Diagnosis not present

## 2023-10-21 DIAGNOSIS — C3431 Malignant neoplasm of lower lobe, right bronchus or lung: Secondary | ICD-10-CM

## 2023-10-21 DIAGNOSIS — J449 Chronic obstructive pulmonary disease, unspecified: Secondary | ICD-10-CM | POA: Diagnosis not present

## 2023-10-21 DIAGNOSIS — E785 Hyperlipidemia, unspecified: Secondary | ICD-10-CM | POA: Diagnosis not present

## 2023-10-21 DIAGNOSIS — E278 Other specified disorders of adrenal gland: Secondary | ICD-10-CM | POA: Diagnosis not present

## 2023-10-21 DIAGNOSIS — J9811 Atelectasis: Secondary | ICD-10-CM | POA: Diagnosis not present

## 2023-10-21 DIAGNOSIS — I700 Atherosclerosis of aorta: Secondary | ICD-10-CM | POA: Diagnosis not present

## 2023-10-21 DIAGNOSIS — I10 Essential (primary) hypertension: Secondary | ICD-10-CM | POA: Diagnosis not present

## 2023-10-21 DIAGNOSIS — K8689 Other specified diseases of pancreas: Secondary | ICD-10-CM | POA: Diagnosis not present

## 2023-10-21 DIAGNOSIS — M419 Scoliosis, unspecified: Secondary | ICD-10-CM | POA: Diagnosis not present

## 2023-10-21 DIAGNOSIS — E876 Hypokalemia: Secondary | ICD-10-CM | POA: Diagnosis not present

## 2023-10-21 DIAGNOSIS — N289 Disorder of kidney and ureter, unspecified: Secondary | ICD-10-CM | POA: Diagnosis not present

## 2023-10-21 DIAGNOSIS — M1612 Unilateral primary osteoarthritis, left hip: Secondary | ICD-10-CM | POA: Diagnosis not present

## 2023-10-21 LAB — CMP (CANCER CENTER ONLY)
ALT: 10 U/L (ref 0–44)
AST: 18 U/L (ref 15–41)
Albumin: 3.6 g/dL (ref 3.5–5.0)
Alkaline Phosphatase: 81 U/L (ref 38–126)
Anion gap: 5 (ref 5–15)
BUN: 11 mg/dL (ref 8–23)
CO2: 27 mmol/L (ref 22–32)
Calcium: 8.7 mg/dL — ABNORMAL LOW (ref 8.9–10.3)
Chloride: 105 mmol/L (ref 98–111)
Creatinine: 0.97 mg/dL (ref 0.44–1.00)
GFR, Estimated: >60 mL/min (ref >60)
Glucose, Bld: 105 mg/dL — ABNORMAL HIGH (ref 70–99)
Potassium: 3.8 mmol/L (ref 3.5–5.1)
Sodium: 137 mmol/L (ref 135–145)
Total Bilirubin: 0.4 mg/dL (ref 0.0–1.2)
Total Protein: 6.4 g/dL — ABNORMAL LOW (ref 6.5–8.1)

## 2023-10-21 LAB — CBC WITH DIFFERENTIAL (CANCER CENTER ONLY)
Abs Immature Granulocytes: 0.04 K/uL (ref 0.00–0.07)
Basophils Absolute: 0.1 K/uL (ref 0.0–0.1)
Basophils Relative: 1 %
Eosinophils Absolute: 0.3 K/uL (ref 0.0–0.5)
Eosinophils Relative: 4 %
HCT: 44.6 % (ref 36.0–46.0)
Hemoglobin: 14.9 g/dL (ref 12.0–15.0)
Immature Granulocytes: 1 %
Lymphocytes Relative: 23 %
Lymphs Abs: 1.8 K/uL (ref 0.7–4.0)
MCH: 29.5 pg (ref 26.0–34.0)
MCHC: 33.4 g/dL (ref 30.0–36.0)
MCV: 88.3 fL (ref 80.0–100.0)
Monocytes Absolute: 0.7 K/uL (ref 0.1–1.0)
Monocytes Relative: 8 %
Neutro Abs: 5.1 K/uL (ref 1.7–7.7)
Neutrophils Relative %: 63 %
Platelet Count: 169 K/uL (ref 150–400)
RBC: 5.05 MIL/uL (ref 3.87–5.11)
RDW: 14.7 % (ref 11.5–15.5)
WBC Count: 8 K/uL (ref 4.0–10.5)
nRBC: 0 % (ref 0.0–0.2)

## 2023-10-21 LAB — TSH: TSH: 0.193 u[IU]/mL — ABNORMAL LOW (ref 0.350–4.500)

## 2023-10-21 MED ORDER — SODIUM CHLORIDE 0.9 % IV SOLN
1200.0000 mg | Freq: Once | INTRAVENOUS | Status: AC
  Administered 2023-10-21: 1200 mg via INTRAVENOUS
  Filled 2023-10-21: qty 20

## 2023-10-21 MED ORDER — KETOROLAC TROMETHAMINE 30 MG/ML IJ SOLN
15.0000 mg | Freq: Once | INTRAMUSCULAR | Status: AC
  Administered 2023-10-21: 15 mg via INTRAVENOUS
  Filled 2023-10-21: qty 1

## 2023-10-21 MED ORDER — SODIUM CHLORIDE 0.9 % IV SOLN: Freq: Once | INTRAVENOUS | Status: AC

## 2023-10-21 MED ORDER — KETOROLAC TROMETHAMINE 15 MG/ML IJ SOLN
15.0000 mg | Freq: Once | INTRAMUSCULAR | Status: DC
  Filled 2023-10-21: qty 1

## 2023-10-21 MED ORDER — ONDANSETRON HCL 4 MG/2ML IJ SOLN
8.0000 mg | Freq: Once | INTRAMUSCULAR | Status: AC
  Administered 2023-10-21: 8 mg via INTRAVENOUS
  Filled 2023-10-21: qty 4

## 2023-10-21 NOTE — Progress Notes (Signed)
 Nutrition Follow-up:  Patient with extensive stage small cell lung cancer. She is receiving maintenance therapy with atezolizumab  q21d.   Met with patient in infusion. Patient reports having a hard month. She was recently scammed purchasing a blood hound puppy off of facebook. Patient tearful sharing story and says grandson (67yo) shamed her for this. She has a 67 year old (Dixie) bloodhound at this time who is declining. 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 Endorses poor appetite the last several weeks given above. Patient continues to tolerate maintenance therapy well with no NIS.    Medications: reviewed   Labs: reviewed   Anthropometrics: Wt 179 lb 9.6 oz today decreased 6.8% in 3 weeks which is severe for time frame  9/2 - 192 lb 1 oz  8/11 - 187 lb 4 oz  7/11 - 179 lb 9.6 oz    NUTRITION DIAGNOSIS: Inadequate oral intake - continues   INTERVENTION:  Employed active listening, compassion, and support Reinforced importance of nutrition, encourage small bites/sips throughout the day Provided contact information for LCSW, encouraged to consider counseling/taking advantage of support services available      MONITORING, EVALUATION, GOAL: wt trends, intake   NEXT VISIT: Wednesday November 5 during infusion

## 2023-10-21 NOTE — Patient Instructions (Signed)
 [facility] - A DEPT OF [facility]  Discharge Instructions: Thank you for choosing [facility] to provide your oncology and hematology care.   If you have a lab appointment with the Cancer Center, please go directly to the Cancer Center and check in at the registration area.   Wear comfortable clothing and clothing appropriate for easy access to any Portacath or PICC line.   We strive to give you quality time with your provider. You may need to reschedule your appointment if you arrive late (15 or more minutes).  Arriving late affects you and other patients whose appointments are after yours.  Also, if you miss three or more appointments without notifying the office, you may be dismissed from the clinic at the provider's discretion.      For prescription refill requests, have your pharmacy contact our office and allow 72 hours for refills to be completed.    Today you received the following chemotherapy and/or immunotherapy agents: Atezolizumab  (Tecentriq )    To help prevent nausea and vomiting after your treatment, we encourage you to take your nausea medication as directed.  BELOW ARE SYMPTOMS THAT SHOULD BE REPORTED IMMEDIATELY: *FEVER GREATER THAN 100.4 F (38 C) OR HIGHER *CHILLS OR SWEATING *NAUSEA AND VOMITING THAT IS NOT CONTROLLED WITH YOUR NAUSEA MEDICATION *UNUSUAL SHORTNESS OF BREATH *UNUSUAL BRUISING OR BLEEDING *URINARY PROBLEMS (pain or burning when urinating, or frequent urination) *BOWEL PROBLEMS (unusual diarrhea, constipation, pain near the anus) TENDERNESS IN MOUTH AND THROAT WITH OR WITHOUT PRESENCE OF ULCERS (sore throat, sores in mouth, or a toothache) UNUSUAL RASH, SWELLING OR PAIN  UNUSUAL VAGINAL DISCHARGE OR ITCHING   Items with * indicate a potential emergency and should be followed up as soon as possible or go to the Emergency Department if any problems should occur.  Please show the CHEMOTHERAPY ALERT CARD or  IMMUNOTHERAPY ALERT CARD at check-in to the Emergency Department and triage nurse.  Should you have questions after your visit or need to cancel or reschedule your appointment, please contact [facility] - A DE[facility]  Dept: [phone]  and follow the prompts.  Office hours are 8:00 a.m. to 4:30 p.m. Monday - Friday. Please note that voicemails left after 4:00 p.m. may not be returned until the following business day.  We are closed weekends and major holidays. You have access to a nurse at all times for urgent questions. Please call the main number to the clinic Dept: [phone] and follow the prompts.   For any non-urgent questions, you may also contact your provider using MyChart. We now offer e-Visits for anyone 53 and older to request care online for non-urgent symptoms. For details visit [email or web address].   Also download the MyChart app! Go to the app store, search MyChart, open the app, select [facility], and log in with your MyChart username and password.

## 2023-10-22 ENCOUNTER — Ambulatory Visit: Payer: Self-pay | Admitting: Physician Assistant

## 2023-10-22 LAB — T4: T4, Total: 11.2 ug/dL (ref 4.5–12.0)

## 2023-10-24 ENCOUNTER — Encounter: Payer: Self-pay | Admitting: Physical Medicine and Rehabilitation

## 2023-10-25 ENCOUNTER — Other Ambulatory Visit: Payer: Self-pay | Admitting: Hematology and Oncology

## 2023-10-26 ENCOUNTER — Encounter: Payer: Self-pay | Admitting: Hematology and Oncology

## 2023-10-27 DIAGNOSIS — N641 Fat necrosis of breast: Secondary | ICD-10-CM | POA: Diagnosis not present

## 2023-10-27 DIAGNOSIS — R928 Other abnormal and inconclusive findings on diagnostic imaging of breast: Secondary | ICD-10-CM | POA: Diagnosis not present

## 2023-10-28 ENCOUNTER — Other Ambulatory Visit (HOSPITAL_BASED_OUTPATIENT_CLINIC_OR_DEPARTMENT_OTHER): Payer: Self-pay

## 2023-10-28 ENCOUNTER — Telehealth: Payer: Self-pay | Admitting: *Deleted

## 2023-10-28 MED ORDER — HYDROCODONE-ACETAMINOPHEN 10-325 MG PO TABS: 1.0000 | ORAL_TABLET | ORAL | 0 refills | Status: DC | PRN

## 2023-10-28 MED ORDER — HYDROCODONE-ACETAMINOPHEN 10-325 MG PO TABS
1.0000 | ORAL_TABLET | ORAL | 0 refills | Status: DC | PRN
  Filled 2023-10-28: qty 180, 30d supply, fill #0

## 2023-10-28 NOTE — Telephone Encounter (Signed)
 [facility] and [name] have called because they need you to send a new Rx for her hydrocodone  with dx code documented and confirm its use with a benzo. They need this information to proceed with the refill since this is a new pharmacy for her.

## 2023-10-29 ENCOUNTER — Other Ambulatory Visit: Payer: Self-pay | Admitting: Gastroenterology

## 2023-10-29 ENCOUNTER — Other Ambulatory Visit (HOSPITAL_BASED_OUTPATIENT_CLINIC_OR_DEPARTMENT_OTHER): Payer: Self-pay

## 2023-10-29 ENCOUNTER — Other Ambulatory Visit: Payer: Self-pay | Admitting: Physical Medicine and Rehabilitation

## 2023-10-30 ENCOUNTER — Telehealth: Payer: Self-pay | Admitting: Physical Medicine and Rehabilitation

## 2023-10-30 NOTE — Telephone Encounter (Signed)
 Patient called stating the pharmacy would need to speak with clinical staff or Dr.Lovorn before they fill the medication as the have questions.

## 2023-10-31 ENCOUNTER — Other Ambulatory Visit (HOSPITAL_BASED_OUTPATIENT_CLINIC_OR_DEPARTMENT_OTHER): Payer: Self-pay

## 2023-10-31 NOTE — Telephone Encounter (Signed)
 Asked for Rx's to  to be dc'd from [facility] so she can get her prescriptions from [facility] now Also, [facility] needed to be told (they don't see notes to pharmacy)  So they were unaware of needing G89.4 chronic pain due to Cancer- and  that can have with benzo's due to  CA- but cannot increase dose.  I just called [facility] - they have no record of Rx- which was cancelled  9/30-  And then asked us  to [facility]-  Which was done- [facility].

## 2023-11-04 ENCOUNTER — Other Ambulatory Visit: Payer: Self-pay | Admitting: Hematology and Oncology

## 2023-11-09 DIAGNOSIS — M5417 Radiculopathy, lumbosacral region: Secondary | ICD-10-CM | POA: Insufficient documentation

## 2023-11-09 DIAGNOSIS — R29898 Other symptoms and signs involving the musculoskeletal system: Secondary | ICD-10-CM | POA: Insufficient documentation

## 2023-11-09 DIAGNOSIS — M21371 Foot drop, right foot: Secondary | ICD-10-CM | POA: Insufficient documentation

## 2023-11-09 DIAGNOSIS — G629 Polyneuropathy, unspecified: Secondary | ICD-10-CM | POA: Insufficient documentation

## 2023-11-11 ENCOUNTER — Inpatient Hospital Stay: Attending: Hematology and Oncology

## 2023-11-11 ENCOUNTER — Inpatient Hospital Stay (HOSPITAL_BASED_OUTPATIENT_CLINIC_OR_DEPARTMENT_OTHER): Admitting: Hematology and Oncology

## 2023-11-11 ENCOUNTER — Inpatient Hospital Stay

## 2023-11-11 VITALS — BP 124/74 | HR 83 | Temp 98.1°F | Resp 18

## 2023-11-11 VITALS — BP 150/82 | HR 82 | Temp 98.4°F | Resp 16 | Wt 178.5 lb

## 2023-11-11 DIAGNOSIS — J9811 Atelectasis: Secondary | ICD-10-CM | POA: Insufficient documentation

## 2023-11-11 DIAGNOSIS — Z95828 Presence of other vascular implants and grafts: Secondary | ICD-10-CM | POA: Diagnosis not present

## 2023-11-11 DIAGNOSIS — Z5112 Encounter for antineoplastic immunotherapy: Secondary | ICD-10-CM

## 2023-11-11 DIAGNOSIS — C3431 Malignant neoplasm of lower lobe, right bronchus or lung: Secondary | ICD-10-CM | POA: Diagnosis not present

## 2023-11-11 DIAGNOSIS — K8689 Other specified diseases of pancreas: Secondary | ICD-10-CM | POA: Insufficient documentation

## 2023-11-11 DIAGNOSIS — M47816 Spondylosis without myelopathy or radiculopathy, lumbar region: Secondary | ICD-10-CM | POA: Insufficient documentation

## 2023-11-11 DIAGNOSIS — Z79899 Other long term (current) drug therapy: Secondary | ICD-10-CM | POA: Diagnosis not present

## 2023-11-11 DIAGNOSIS — Z634 Disappearance and death of family member: Secondary | ICD-10-CM | POA: Diagnosis not present

## 2023-11-11 DIAGNOSIS — Z809 Family history of malignant neoplasm, unspecified: Secondary | ICD-10-CM | POA: Insufficient documentation

## 2023-11-11 DIAGNOSIS — M419 Scoliosis, unspecified: Secondary | ICD-10-CM | POA: Diagnosis not present

## 2023-11-11 DIAGNOSIS — I2510 Atherosclerotic heart disease of native coronary artery without angina pectoris: Secondary | ICD-10-CM | POA: Insufficient documentation

## 2023-11-11 DIAGNOSIS — I700 Atherosclerosis of aorta: Secondary | ICD-10-CM | POA: Insufficient documentation

## 2023-11-11 DIAGNOSIS — I10 Essential (primary) hypertension: Secondary | ICD-10-CM | POA: Diagnosis not present

## 2023-11-11 DIAGNOSIS — G629 Polyneuropathy, unspecified: Secondary | ICD-10-CM | POA: Insufficient documentation

## 2023-11-11 DIAGNOSIS — E876 Hypokalemia: Secondary | ICD-10-CM | POA: Diagnosis not present

## 2023-11-11 DIAGNOSIS — Z825 Family history of asthma and other chronic lower respiratory diseases: Secondary | ICD-10-CM | POA: Insufficient documentation

## 2023-11-11 DIAGNOSIS — F17210 Nicotine dependence, cigarettes, uncomplicated: Secondary | ICD-10-CM | POA: Insufficient documentation

## 2023-11-11 DIAGNOSIS — K5900 Constipation, unspecified: Secondary | ICD-10-CM | POA: Insufficient documentation

## 2023-11-11 DIAGNOSIS — K5730 Diverticulosis of large intestine without perforation or abscess without bleeding: Secondary | ICD-10-CM | POA: Diagnosis not present

## 2023-11-11 DIAGNOSIS — E785 Hyperlipidemia, unspecified: Secondary | ICD-10-CM | POA: Insufficient documentation

## 2023-11-11 DIAGNOSIS — Z814 Family history of other substance abuse and dependence: Secondary | ICD-10-CM | POA: Insufficient documentation

## 2023-11-11 DIAGNOSIS — K7460 Unspecified cirrhosis of liver: Secondary | ICD-10-CM | POA: Diagnosis not present

## 2023-11-11 DIAGNOSIS — Z823 Family history of stroke: Secondary | ICD-10-CM | POA: Insufficient documentation

## 2023-11-11 DIAGNOSIS — M47814 Spondylosis without myelopathy or radiculopathy, thoracic region: Secondary | ICD-10-CM | POA: Insufficient documentation

## 2023-11-11 DIAGNOSIS — E039 Hypothyroidism, unspecified: Secondary | ICD-10-CM | POA: Diagnosis not present

## 2023-11-11 DIAGNOSIS — Z96641 Presence of right artificial hip joint: Secondary | ICD-10-CM | POA: Insufficient documentation

## 2023-11-11 DIAGNOSIS — M1612 Unilateral primary osteoarthritis, left hip: Secondary | ICD-10-CM | POA: Diagnosis not present

## 2023-11-11 DIAGNOSIS — N289 Disorder of kidney and ureter, unspecified: Secondary | ICD-10-CM | POA: Insufficient documentation

## 2023-11-11 DIAGNOSIS — Z820 Family history of epilepsy and other diseases of the nervous system: Secondary | ICD-10-CM | POA: Insufficient documentation

## 2023-11-11 DIAGNOSIS — Z882 Allergy status to sulfonamides status: Secondary | ICD-10-CM | POA: Insufficient documentation

## 2023-11-11 DIAGNOSIS — Z8261 Family history of arthritis: Secondary | ICD-10-CM | POA: Insufficient documentation

## 2023-11-11 DIAGNOSIS — Z811 Family history of alcohol abuse and dependence: Secondary | ICD-10-CM | POA: Insufficient documentation

## 2023-11-11 DIAGNOSIS — Z888 Allergy status to other drugs, medicaments and biological substances status: Secondary | ICD-10-CM | POA: Insufficient documentation

## 2023-11-11 DIAGNOSIS — Z9841 Cataract extraction status, right eye: Secondary | ICD-10-CM | POA: Insufficient documentation

## 2023-11-11 DIAGNOSIS — Z90721 Acquired absence of ovaries, unilateral: Secondary | ICD-10-CM | POA: Insufficient documentation

## 2023-11-11 DIAGNOSIS — Z8249 Family history of ischemic heart disease and other diseases of the circulatory system: Secondary | ICD-10-CM | POA: Insufficient documentation

## 2023-11-11 DIAGNOSIS — Z8719 Personal history of other diseases of the digestive system: Secondary | ICD-10-CM | POA: Insufficient documentation

## 2023-11-11 DIAGNOSIS — J438 Other emphysema: Secondary | ICD-10-CM | POA: Insufficient documentation

## 2023-11-11 DIAGNOSIS — Z880 Allergy status to penicillin: Secondary | ICD-10-CM | POA: Insufficient documentation

## 2023-11-11 DIAGNOSIS — Z886 Allergy status to analgesic agent status: Secondary | ICD-10-CM | POA: Insufficient documentation

## 2023-11-11 DIAGNOSIS — Z90710 Acquired absence of both cervix and uterus: Secondary | ICD-10-CM | POA: Insufficient documentation

## 2023-11-11 DIAGNOSIS — E278 Other specified disorders of adrenal gland: Secondary | ICD-10-CM | POA: Diagnosis not present

## 2023-11-11 DIAGNOSIS — Z9049 Acquired absence of other specified parts of digestive tract: Secondary | ICD-10-CM | POA: Insufficient documentation

## 2023-11-11 DIAGNOSIS — Z9842 Cataract extraction status, left eye: Secondary | ICD-10-CM | POA: Insufficient documentation

## 2023-11-11 LAB — CBC WITH DIFFERENTIAL (CANCER CENTER ONLY)
Abs Immature Granulocytes: 0.03 K/uL (ref 0.00–0.07)
Basophils Absolute: 0.1 K/uL (ref 0.0–0.1)
Basophils Relative: 1 %
Eosinophils Absolute: 0.6 K/uL — ABNORMAL HIGH (ref 0.0–0.5)
Eosinophils Relative: 8 %
HCT: 43.1 % (ref 36.0–46.0)
Hemoglobin: 14.2 g/dL (ref 12.0–15.0)
Immature Granulocytes: 0 %
Lymphocytes Relative: 29 %
Lymphs Abs: 2.1 K/uL (ref 0.7–4.0)
MCH: 29.6 pg (ref 26.0–34.0)
MCHC: 32.9 g/dL (ref 30.0–36.0)
MCV: 90 fL (ref 80.0–100.0)
Monocytes Absolute: 0.5 K/uL (ref 0.1–1.0)
Monocytes Relative: 7 %
Neutro Abs: 3.9 K/uL (ref 1.7–7.7)
Neutrophils Relative %: 55 %
Platelet Count: 152 K/uL (ref 150–400)
RBC: 4.79 MIL/uL (ref 3.87–5.11)
RDW: 15 % (ref 11.5–15.5)
WBC Count: 7.1 K/uL (ref 4.0–10.5)
nRBC: 0 % (ref 0.0–0.2)

## 2023-11-11 LAB — CMP (CANCER CENTER ONLY)
ALT: 8 U/L (ref 0–44)
AST: 11 U/L — ABNORMAL LOW (ref 15–41)
Albumin: 3.4 g/dL — ABNORMAL LOW (ref 3.5–5.0)
Alkaline Phosphatase: 78 U/L (ref 38–126)
Anion gap: 5 (ref 5–15)
BUN: 15 mg/dL (ref 8–23)
CO2: 28 mmol/L (ref 22–32)
Calcium: 8.8 mg/dL — ABNORMAL LOW (ref 8.9–10.3)
Chloride: 108 mmol/L (ref 98–111)
Creatinine: 1.16 mg/dL — ABNORMAL HIGH (ref 0.44–1.00)
GFR, Estimated: 52 mL/min — ABNORMAL LOW (ref >60)
Glucose, Bld: 93 mg/dL (ref 70–99)
Potassium: 4 mmol/L (ref 3.5–5.1)
Sodium: 141 mmol/L (ref 135–145)
Total Bilirubin: 0.4 mg/dL (ref 0.0–1.2)
Total Protein: 5.9 g/dL — ABNORMAL LOW (ref 6.5–8.1)

## 2023-11-11 LAB — TSH: TSH: 0.103 u[IU]/mL — ABNORMAL LOW (ref 0.350–4.500)

## 2023-11-11 MED ORDER — SODIUM CHLORIDE 0.9% FLUSH
10.0000 mL | INTRAVENOUS | Status: DC | PRN
  Administered 2023-11-11: 10 mL

## 2023-11-11 MED ORDER — BENZONATATE 100 MG PO CAPS: 100.0000 mg | ORAL_CAPSULE | Freq: Three times a day (TID) | ORAL | 1 refills | Status: AC

## 2023-11-11 MED ORDER — HYDROXYZINE HCL 10 MG PO TABS: 10.0000 mg | ORAL_TABLET | Freq: Four times a day (QID) | ORAL | 2 refills | Status: DC | PRN

## 2023-11-11 MED ORDER — SODIUM CHLORIDE 0.9 % IV SOLN
1200.0000 mg | Freq: Once | INTRAVENOUS | Status: AC
  Administered 2023-11-11: 1200 mg via INTRAVENOUS
  Filled 2023-11-11: qty 20

## 2023-11-11 MED ORDER — ONDANSETRON HCL 4 MG/2ML IJ SOLN
8.0000 mg | Freq: Once | INTRAMUSCULAR | Status: AC
  Administered 2023-11-11: 8 mg via INTRAVENOUS
  Filled 2023-11-11: qty 4

## 2023-11-11 MED ORDER — SODIUM CHLORIDE 0.9 % IV SOLN: Freq: Once | INTRAVENOUS | Status: AC

## 2023-11-11 NOTE — Progress Notes (Signed)
 [facility] [phone]   [phone]  PROGRESS NOTE  Patient Care Team: [name], FNP as PCP - General (Family Medicine) O'Neal, Darryle Ned, MD as PCP - Cardiology (Cardiology) [name], MD (Inactive) as Attending Physician (Gastroenterology) [name], MD as Consulting Physician (Orthopedic Surgery) [name], MD as Consulting Physician (Orthopedic Surgery) [name], MD (Inactive) as Consulting Physician (Neurology) [name], MD as Consulting Physician (Neurology) [name], [facility] (Inactive) (Pharmacist) Pllc, [facility]   Hematological/Oncological History # Small Cell Lung Cancer, Extensive Stage 07/05/2020: CT abdomen for lower abdominal pain. New pulmonary nodular density noted 07/06/2020: CT chest showed 2.2 cm macrolobulated right lower lobe pulmonary nodule (favored) versus pathologically enlarged infrahilar lymph node 10/13/2020: PET CT scan performed, findings show 2 cm right lower lobe lung mass is hypermetabolic and consistent with primary lung neoplasm. Additionally found hypermetabolic 17 mm soft tissue lesion between the descending duodenum and the pancreatic head  10/19/2020: EGD to biopsy hypermetabolic lymph node. Biopsy results show small cell lung cancer 10/26/2020: establish care with Dr. Federico  11/13/2020: Cycle 1 Day 1 of Carbo/Etop/Atezolizumab  12/04/2020: Cycle 2 Day 1 of Carbo/Etop/Atezolizumab  11/22-11/25/2022: admitted for E. Coli bacteremia/sepsis. Start of Cycle 3 delayed. 01/02/2021: Cycle 3 Day 1 of Carbo/Etop/Atezolizumab  01/23/2021: Cycle 4 Day 1 of Carbo/Etop/Atezolizumab  02/22/2021: Cycle 5 Day 1 of Atezolizumab  Maintenance. Delayed start due to patient's COVID infection.  03/14/2021: Cycle 6 Day 1 of Atezolizumab  Maintenance 04/05/2021: Cycle 7 Day 1 of Atezolizumab  Maintenance 04/25/2021: Cycle 8 Day 1 of Atezolizumab   Maintenance 05/16/2021: Cycle 9 Day 1 of Atezolizumab  Maintenance 06/06/2021: Cycle 10 Day 1 of Atezolizumab  Maintenance 06/27/2021:  Cycle 11 Day 1 of Atezolizumab  Maintenance 07/18/2021: Cycle 12 Day 1 of Atezolizumab  Maintenance 08/08/2021: Cycle 13 Day 1 of Atezolizumab  Maintenance 08/29/2021: Cycle 14 Day 1 of Atezolizumab  Maintenance 09/19/2021: Cycle 15 Day 1 of Atezolizumab  Maintenance 10/10/2021: Cycle 16 Day 1 of Atezolizumab  Maintenance 10/31/2021: Cycle 17 Day 1 of Atezolizumab  Maintenance 11/21/2021: Cycle 18 Day 1 of Atezolizumab  Maintenance 12/12/2021: Cycle 19 Day 1 of Atezolizumab  Maintenance 01/02/2022: Cycle 20 Day 1 of Atezolizumab  Maintenance 01/23/2022: Cycle 21 Day 1 of Atezolizumab  Maintenance 02/13/2022: treatment HELD due to diarrhea.  03/06/2022: Cycle 22 Day 1 of Atezolizumab  Maintenance 03/27/2022: Cycle 23 Day 1 of Atezolizumab  Maintenance 04/17/2022: Cycle 24 Day 1 of Atezolizumab  Maintenance 05/08/2022:  Cycle 25 Day 1 of Atezolizumab  Maintenance (Held due to patient preference for treatment holiday) 05/29/2022: Cycle 25 Day 1 of Atezolizumab  Maintenance  06/19/2022: Cycle 26 Day 1 of Atezolizumab  Maintenance  07/12/2022: Cycle 27 Day 1 of Atezolizumab  Maintenance 07/31/2022: Cycle 28 Day 1 of Atezolizumab  Maintenance HELD per patient request for fatigue.  08/22/2022: Cycle 28 Day 1 of Atezolizumab  Maintenance 09/11/2022: Cycle 29 Day 1 of Atezolizumab  Maintenance 10/03/2022: Cycle 30 Day 1 of Atezolizumab  Maintenance 10/23/2022: Cycle 31 Day 1 of Atezolizumab  Maintenance 11/13/2022: Cycle 32 Day 1 of Atezolizumab  Maintenance 12/24/2022: Cycle 33 Day 1 of Atezolizumab  Maintenance 01/15/2023: Cycle 34 Day 1 of Atezolizumab  Maintenance 02/05/2023: Cycle 35 Day 1 of Atezolizumab  Maintenance 02/26/2023: Cycle 36 Day 1 of Atezolizumab  Maintenance 04/28/2023: Cycle 37 Day 1 of Atezolizumab  Maintenance 04/30/2023: Cycle 38 Day 1 of Atezolizumab  Maintenance 05/21/2023: Cycle 39 Day 1 of  Atezolizumab  Maintenance 06/11/2023: Cycle 40 Day 1 of Atezolizumab  Maintenance 07/28/2023: Cycle 41 Day 1 of Atezolizumab  Maintenance 08/18/2023: Cycle 42 Day 1 of Atezolizumab  Maintenance 09/08/2023: Cycle 43 Day 1 of Atezolizumab  Maintenance 09/30/2023:  Cycle  44 Day 1 of Atezolizumab  Maintenance 10/20/2023: Cycle 45 Day 1 of Atezolizumab  Maintenance  Interval History:  [name] 68 y.o. female with medical history significant for extensive stage small cell lung cancer who presents for a follow up visit. The patient's last visit was on 10/21/2023.  She presents today to start cycle 46 of maintenance atezolizumab . She is unaccompanied for this visit.   On exam today [name] reports she is enjoying the cooler weather.  She reports she recently underwent a nerve conduction study and was found to have neuropathy.  She reports that she will be seeing her neurologist shortly for the results.  She notes that she does continue to have constipation but she is taking stool softener to try to help with this.  She reports that she is having some nauseous and wheezy sensations but it is well-controlled with her Zofran  medication.  She reports he has a lot of fatigue and her appetite is poor.  Her weight remains steady at 178 pounds.  She notes that she continues to take care of her brother who is quite ill.  She otherwise denies any fevers, chills, sweats, runny nose, sore throat, cough.  She is willing and able to continue with atezolizumab  therapy at this time.  A full 10 point ROS otherwise negative.  MEDICAL HISTORY:  Past Medical History:  Diagnosis Date   Allergic rhinitis    Anemia    Anxiety    Chicken pox    Chronic back pain    COPD (chronic obstructive pulmonary disease) (HCC)    Depression    DM (diabetes mellitus) (HCC)    Essential hypertension    GERD (gastroesophageal reflux disease)    Headache    migraines   History of gastritis    EGD 2015   History of home oxygen  therapy     2 liters at hs last 6 months   Hyperlipidemia    Hypothyroidism    Migraines    Osteoarthritis    oa   Scoliosis    Small cell lung cancer (HCC)    Stage 4    SURGICAL HISTORY: Past Surgical History:  Procedure Laterality Date   APPENDECTOMY     1985   BIOPSY  07/24/2017   Procedure: BIOPSY;  Surgeon: [name], MD;  Location: [facility];  Service: Endoscopy;;   CARDIAC CATHETERIZATION N/A 10/31/2015   Procedure: Left Heart Cath and Coronary Angiography;  Surgeon: Alm LELON Clay, MD;  Location: [facility] INVASIVE CV LAB;  Service: Cardiovascular;  Laterality: N/A;   CARPAL TUNNEL RELEASE Left    CARPAL TUNNEL RELEASE Right    CATARACT EXTRACTION W/PHACO Left 02/07/2023   Procedure: CATARACT EXTRACTION PHACO AND INTRAOCULAR LENS PLACEMENT (IOC);  Surgeon: Juli Blunt, MD;  Location: [facility];  Service: Ophthalmology;  Laterality: Left;  CDE: 3.51   CATARACT EXTRACTION W/PHACO Right 02/21/2023   Procedure: CATARACT EXTRACTION PHACO AND INTRAOCULAR LENS PLACEMENT (IOC);  Surgeon: Juli Blunt, MD;  Location: [facility];  Service: Ophthalmology;  Laterality: Right;  CDE: 2.94   CHOLECYSTECTOMY  late 1980's   COLONOSCOPY WITH PROPOFOL  N/A 07/24/2017   Procedure: COLONOSCOPY WITH PROPOFOL ;  Surgeon: [name], MD;  Location: [facility];  Service: Endoscopy;  Laterality: N/A;   ESOPHAGOGASTRODUODENOSCOPY N/A 07/24/2017   Procedure: ESOPHAGOGASTRODUODENOSCOPY (EGD);  Surgeon: [name], MD;  Location: [facility];  Service: Endoscopy;  Laterality: N/A;   ESOPHAGOGASTRODUODENOSCOPY (EGD) WITH PROPOFOL  N/A 10/19/2020   Procedure: ESOPHAGOGASTRODUODENOSCOPY (EGD) WITH PROPOFOL ;  Surgeon:  [name], MD;  Location: [facility];  Service: Endoscopy;  Laterality: N/A;   FINE NEEDLE ASPIRATION N/A 10/19/2020   Procedure: FINE NEEDLE ASPIRATION (FNA) LINEAR;  Surgeon: [name], MD;  Location: [facility];  Service: Endoscopy;  Laterality: N/A;   GALLBLADDER  SURGERY  1991   HIP CLOSED REDUCTION Right 01/08/2016   Procedure: CLOSED MANIPULATION HIP;  Surgeon: Reyes Billing, MD;  Location: [facility];  Service: Orthopedics;  Laterality: Right;   HIP CLOSED REDUCTION Right 01/19/2016   Procedure: ATTEMPTED CLOSED REDUCTION RIGHT HIP;  Surgeon: Norleen Armor, MD;  Location: [facility];  Service: Orthopedics;  Laterality: Right;   HIP CLOSED REDUCTION Right 01/20/2016   Procedure: CLOSED REDUCTION RIGHT TOTAL HIP;  Surgeon: Donnice Car, MD;  Location: [facility];  Service: Orthopedics;  Laterality: Right;   HIP CLOSED REDUCTION Right 02/17/2016   Procedure: CLOSED REDUCTION RIGHT TOTAL HIP;  Surgeon: Redell Shoals, MD;  Location: [facility];  Service: Orthopedics;  Laterality: Right;   HIP CLOSED REDUCTION Right 02/28/2016   Procedure: CLOSED REDUCTION HIP;  Surgeon: Selinda Belvie Gosling, MD;  Location: [facility];  Service: Orthopedics;  Laterality: Right;   IR IMAGING GUIDED PORT INSERTION  11/01/2020   POLYPECTOMY  07/24/2017   Procedure: POLYPECTOMY;  Surgeon: [name], MD;  Location: [facility];  Service: Endoscopy;;   TONSILLECTOMY     TOTAL ABDOMINAL HYSTERECTOMY     1985, with 1 ovary removed and 2 nd ovary removed 2003   TOTAL HIP ARTHROPLASTY Right    Original surgery 2006 with revision 2010   TOTAL HIP REVISION Right 01/01/2016   Procedure: TOTAL HIP REVISION;  Surgeon: Donnice Car, MD;  Location: [facility];  Service: Orthopedics;  Laterality: Right;   TOTAL HIP REVISION Right 03/02/2016   Procedure: TOTAL HIP REVISION;  Surgeon: Car Donnice, MD;  Location: [facility];  Service: Orthopedics;  Laterality: Right;   TOTAL HIP REVISION Right 09/02/2016   Procedure: Right hip constrained liner- posterior;  Surgeon: Car Donnice, MD;  Location: [facility];  Service: Orthopedics;  Laterality: Right;   ULNAR NERVE TRANSPOSITION Right    UPPER ESOPHAGEAL ENDOSCOPIC ULTRASOUND (EUS) N/A 10/19/2020   Procedure: UPPER ESOPHAGEAL ENDOSCOPIC ULTRASOUND (EUS);  Surgeon: [name], MD;  Location: [facility];  Service: Endoscopy;  Laterality: N/A;  periduodenal lesion    SOCIAL HISTORY: Social History   Socioeconomic History   Marital status: Married    Spouse name: Not on file   Number of children: 2   Years of education: Not on file   Highest education level: 12th grade  Occupational History   Occupation: disabled   Occupation: disabled  Tobacco Use   Smoking status: Every Day    Current packs/day: 2.00    Average packs/day: 2.0 packs/day for 46.0 years (92.0 ttl pk-yrs)    Types: Cigarettes   Smokeless tobacco: Never   Tobacco comments:    2 packs of cigarettes smoked daily 12/13/21- declines smoking cessation  Vaping Use   Vaping status: Never Used  Substance and Sexual Activity   Alcohol use: No   Drug use: No   Sexual activity: Not Currently    Partners: Male  Other Topics Concern   Not on file  Social History Narrative   Right handed    Caffeine~ 2 cups per day    Lives at home with husband (strained relationship)   Primary caretaker for disabled brother who had aneurism   Daughter died 06-23-2018  Social Drivers of Corporate investment banker Strain: Low Risk  (07/31/2023)   Received from [facility]   Overall Financial Resource Strain (CARDIA)    How hard is it for you to pay for the very basics like food, housing, medical care, and heating?: Not very hard  Food Insecurity: No Food Insecurity (07/31/2023)   Received from [facility]   Hunger Vital Sign    Within the past 12 months, you worried that your food would run out before you got the money to buy more.: Never true    Within the past 12 months, the food you bought just didn't last and you didn't have money to get more.: Never true  Recent Concern: Food Insecurity - Food Insecurity Present (05/20/2023)   Hunger Vital Sign    Worried About Running Out of Food in the Last Year: Sometimes true    Ran Out of Food in the Last Year: Sometimes true  Transportation  Needs: No Transportation Needs (07/31/2023)   Received from [facility]   PRAPARE - Transportation    Lack of Transportation (Medical): No    Lack of Transportation (Non-Medical): No  Physical Activity: Inactive (05/20/2023)   Exercise Vital Sign    Days of Exercise per Week: 0 days    Minutes of Exercise per Session: 0 min  Stress: No Stress Concern Present (05/20/2023)   [employer] of Occupational Health - Occupational Stress Questionnaire    Feeling of Stress : Only a little  Recent Concern: Stress - Stress Concern Present (03/31/2023)   [employer] of Occupational Health - Occupational Stress Questionnaire    Feeling of Stress : Very much  Social Connections: Moderately Integrated (05/20/2023)   Social Connection and Isolation Panel    Frequency of Communication with Friends and Family: More than three times a week    Frequency of Social Gatherings with Friends and Family: More than three times a week    Attends Religious Services: 1 to 4 times per year    Active Member of [employer] or Organizations: No    Attends Banker Meetings: Never    Marital Status: Married  Catering manager Violence: Not At Risk (07/31/2023)   Received from [facility]   Humiliation, Afraid, Rape, and Kick questionnaire    Within the last year, have you been afraid of your partner or ex-partner?: No    Within the last year, have you been humiliated or emotionally abused in other ways by your partner or ex-partner?: No    Within the last year, have you been kicked, hit, slapped, or otherwise physically hurt by your partner or ex-partner?: No    Within the last year, have you been raped or forced to have any kind of sexual activity by your partner or ex-partner?: No    FAMILY HISTORY: Family History  Problem Relation Age of Onset   COPD Mother    Heart disease Mother    Lung disease Father        Asbestosis   Heart attack Father    Heart disease Father    Cerebral aneurysm  Brother    Aneurysm Brother        Brain   Drug abuse Daughter    Epilepsy Son    Alcohol abuse Son    Drug abuse Son    Arthritis Maternal Grandmother    Heart disease Maternal Grandmother    Asthma Maternal Grandfather    Cancer Maternal Grandfather  Arthritis Paternal Grandmother    Heart disease Paternal Grandmother    Stroke Paternal Grandmother    Early death Paternal Grandfather    Heart disease Paternal Grandfather     ALLERGIES:  is allergic to nisoldipine, metformin  and related, nsaids, pregabalin, wellbutrin  [bupropion ], aleve [naproxen sodium], penicillins, and sulfonamide derivatives.  MEDICATIONS:  Current Outpatient Medications  Medication Sig Dispense Refill   dicyclomine  (BENTYL ) 10 MG capsule TAKE 1 CAPSULE BY MOUTH EVERY 8 HOURS AS NEEDED FOR SPASMS 60 capsule 1   diphenoxylate -atropine  (LOMOTIL ) 2.5-0.025 MG tablet TAKE 1 TABLET BY MOUTH FOUR TIMES DAILY AS NEEDED FOR diarrhea OR loose stools 60 tablet 0   DULoxetine  (CYMBALTA ) 60 MG capsule TAKE 2 CAPSULES BY MOUTH EVERY DAY 180 capsule 1   ferrous sulfate  325 (65 FE) MG EC tablet TAKE 1 TABLET BY MOUTH DAILY WITH BREAKFAST 30 tablet 3   fluticasone  (FLONASE ) 50 MCG/ACT nasal spray INSTILL 2 SPRAYS IN EACH NOSTRIL EVERY DAY 16 g 6   furosemide  (LASIX ) 20 MG tablet TAKE 1 TABLET BY MOUTH TWICE DAILY, IF SWELLING IMPROVES CAN HOLD MEDICATION 90 tablet 0   HYDROcodone -acetaminophen  (NORCO) 10-325 MG tablet Take 1 tablet by mouth every 4 (four) hours as needed. 180 tablet 0   levothyroxine  (SYNTHROID ) 137 MCG tablet Take 1 tablet (137 mcg total) by mouth daily before breakfast. 90 tablet 3   lidocaine -prilocaine  (EMLA ) cream Apply 1 Application topically as needed. 30 g 2   loratadine  (CLARITIN ) 10 MG tablet Take 10 mg by mouth daily.     meclizine  (ANTIVERT ) 12.5 MG tablet Take 1 tablet (12.5 mg total) by mouth 2 (two) times daily. 60 tablet 1   meloxicam  (MOBIC ) 15 MG tablet TAKE 1 TABLET BY MOUTH EVERY DAY 30  tablet 2   NARCAN  4 MG/0.1ML LIQD nasal spray kit SMARTSIG:Both Nares     OLANZapine  (ZYPREXA ) 10 MG tablet Take 1 tablet (10 mg total) by mouth at bedtime. 30 tablet 2   omeprazole  (PRILOSEC) 40 MG capsule Take 1 capsule (40 mg total) by mouth 2 (two) times daily. 180 capsule 3   ondansetron  (ZOFRAN ) 8 MG tablet TAKE 1 TABLET BY MOUTH TWICE DAILY 60 tablet 1   permethrin  (ELIMITE ) 5 % cream      potassium chloride  SA (KLOR-CON  M) 20 MEQ tablet TAKE 1 TABLET BY MOUTH TWICE DAILY 60 tablet 2   prochlorperazine  (COMPAZINE ) 10 MG tablet Take 1 tablet (10 mg total) by mouth every 6 (six) hours as needed for nausea or vomiting. 30 tablet 0   Rimegepant Sulfate (NURTEC) 75 MG TBDP TAKE 1 TABLET BY MOUTH DAILY AS NEEDED FOR MIGRAINE, take WITHIN 15 MINUTES of initial symptoms 8 tablet 1   topiramate  (TOPAMAX ) 100 MG tablet TAKE 1 TABLET BY MOUTH AT BEDTIME 90 tablet 1   triamcinolone  ointment (KENALOG ) 0.5 % Apply 1 Application topically 3 (three) times daily. Apply thin-layer on affected areas, no more than 2 weeks consistently. 60 g 2   triamterene -hydrochlorothiazide  (MAXZIDE -25) 37.5-25 MG tablet Take 1 tablet by mouth daily.     albuterol  (PROAIR  HFA) 108 (90 Base) MCG/ACT inhaler 2 puffs every 4 hours as needed only  if your can't catch your breath 18 g 5   ALPRAZolam  (XANAX ) 1 MG tablet TAKE 1 TABLET BY MOUTH THREE TIMES DAILY AS NEEDED FOR ANXIETY 90 tablet 2   ARIPiprazole  (ABILIFY ) 5 MG tablet Take 1 tablet (5 mg total) by mouth daily. 30 tablet 5   atorvastatin  (LIPITOR) 20 MG tablet TAKE 1  TABLET BY MOUTH AT BEDTIME 90 tablet 1   benzonatate  (TESSALON ) 100 MG capsule Take 1 capsule (100 mg total) by mouth 3 (three) times daily. 90 capsule 1   calcipotriene  (DOVONOX) 0.005 % cream Apply to affected area bid when on break from steroid cream 120 g 3   cyclobenzaprine  (FLEXERIL ) 10 MG tablet Take 10 mg by mouth 3 (three) times daily as needed.     fluconazole  (DIFLUCAN ) 150 MG tablet Take 150 mg  by mouth once. (Patient not taking: Reported on 11/11/2023)     hydrOXYzine  (ATARAX ) 10 MG tablet Take 1 tablet (10 mg total) by mouth every 6 (six) hours as needed. 30 tablet 2   nystatin  (MYCOSTATIN ) 100000 UNIT/ML suspension  (Patient not taking: Reported on 11/11/2023)     umeclidinium-vilanterol (ANORO ELLIPTA ) 62.5-25 MCG/ACT AEPB Inhale 1 puff into the lungs daily. 60 each 11   No current facility-administered medications for this visit.    REVIEW OF SYSTEMS:   Constitutional: ( - ) fevers, ( - )  chills , ( - ) night sweats Eyes: ( - ) blurriness of vision, ( - ) double vision, ( - ) watery eyes Ears, nose, mouth, throat, and face: ( - ) mucositis, ( - ) sore throat Respiratory: ( - ) cough, ( -) dyspnea, ( - ) wheezes Cardiovascular: ( - ) palpitation, ( - ) chest discomfort, ( + ) lower extremity swelling Gastrointestinal:  ( -) nausea, ( - ) heartburn, ( - ) change in bowel habits Skin: ( - ) abnormal skin rashes Lymphatics: ( - ) new lymphadenopathy, ( - ) easy bruising Neurological: (+ ) numbness, ( - ) tingling, ( - ) new weaknesses Behavioral/Psych: ( - ) mood change, ( - ) new changes  All other systems were reviewed with the patient and are negative.  PHYSICAL EXAMINATION: ECOG PERFORMANCE STATUS: 1 - Symptomatic but completely ambulatory  Vitals:   11/11/23 1048  BP: (!) 150/82  Pulse: 82  Resp: 16  Temp: 98.4 F (36.9 C)  SpO2: 97%      Filed Weights   11/11/23 1048  Weight: 178 lb 8 oz (81 kg)       GENERAL: Well-appearing middle-age Caucasian female, alert, no distress and comfortable SKIN: skin color, texture, turgor are normal, no rashes or significant lesions EYES: conjunctiva are pink and non-injected, sclera clear LUNGS:  normal breathing effort. Diffuse wheezing hear on ausculation.  HEART: regular rate & rhythm and no murmurs. +1 bilateral pitting lower extremity edema starting mid calf. Musculoskeletal: no cyanosis of digits and no  clubbing  PSYCH: alert & oriented x 3, fluent speech NEURO: no focal motor/sensory deficits  LABORATORY DATA:  I have reviewed the data as listed    Latest Ref Rng & Units 11/11/2023   10:15 AM 10/21/2023   10:16 AM 09/30/2023   10:48 AM  CBC  WBC 4.0 - 10.5 K/uL 7.1  8.0  8.8   Hemoglobin 12.0 - 15.0 g/dL 85.7  85.0  86.3   Hematocrit 36.0 - 46.0 % 43.1  44.6  40.7   Platelets 150 - 400 K/uL 152  169  181        Latest Ref Rng & Units 11/11/2023   10:15 AM 10/21/2023   10:16 AM 09/30/2023   10:48 AM  CMP  Glucose 70 - 99 mg/dL 93  894  86   BUN 8 - 23 mg/dL 15  11  12    Creatinine 0.44 - 1.00 mg/dL 8.83  0.97  1.11   Sodium 135 - 145 mmol/L 141  137  135   Potassium 3.5 - 5.1 mmol/L 4.0  3.8  3.9   Chloride 98 - 111 mmol/L 108  105  101   CO2 22 - 32 mmol/L 28  27  27    Calcium  8.9 - 10.3 mg/dL 8.8  8.7  8.4   Total Protein 6.5 - 8.1 g/dL 5.9  6.4  6.1   Total Bilirubin 0.0 - 1.2 mg/dL 0.4  0.4  0.4   Alkaline Phos 38 - 126 U/L 78  81  79   AST 15 - 41 U/L 11  18  16    ALT 0 - 44 U/L 8  10  13      No results found for: MPROTEIN Lab Results  Component Value Date   KPAFRELGTCHN 0.75 06/30/2014   LAMBDASER 3.78 (H) 06/30/2014   KAPLAMBRATIO 0.20 (L) 06/30/2014     RADIOGRAPHIC STUDIES: CT CHEST ABDOMEN PELVIS W CONTRAST Result Date: 10/18/2023 CLINICAL DATA:  History of lung cancer, follow-up. * Tracking Code: BO * EXAM: CT CHEST, ABDOMEN, AND PELVIS WITH CONTRAST TECHNIQUE: Multidetector CT imaging of the chest, abdomen and pelvis was performed following the standard protocol during bolus administration of intravenous contrast. RADIATION DOSE REDUCTION: This exam was performed according to the departmental dose-optimization program which includes automated exposure control, adjustment of the mA and/or kV according to patient size and/or use of iterative reconstruction technique. CONTRAST:  OMNIPAQUE  IOHEXOL  300 MG/ML  SOLN COMPARISON:  Multiple priors including CT  April 17, 2023 FINDINGS: CT CHEST FINDINGS Cardiovascular: Right chest Port-A-Cath with the tip in the right atrium. Aortic atherosclerosis. Normal size heart. No significant pericardial effusion/thickening. Coronary artery calcifications. Mediastinum/Nodes: No suspicious thyroid  nodule. No pathologically enlarged mediastinal, hilar or axillary lymph nodes. The esophagus is grossly unremarkable. Lungs/Pleura: No evidence of recurrence of the previously treated perihilar right lower lobe pulmonary neoplasm. No new suspicious pulmonary nodules or masses. Scattered atelectasis/scarring. Apical predominance paraseptal emphysema. Musculoskeletal: No aggressive lytic or blastic lesion of bone. Thoracic spondylosis. CT ABDOMEN PELVIS FINDINGS Hepatobiliary: No suspicious hepatic lesion. Slight enlargement of the lateral left lobe of the liver with questionable contour nodularity for instance on image 50/2. Gallbladder surgically absent. Similar prominence of the biliary tree favored reservoir effect post cholecystectomy. Pancreas: No pancreatic ductal dilation or evidence of acute inflammation. Spleen: No splenomegaly. Adrenals/Urinary Tract: 10 mm left adrenal nodule has been present over multiple prior imaging evaluations and was not hypermetabolic on PET-CT October 13, 2020 compatible with a benign adenoma. Right adrenal gland appears normal. No hydronephrosis. 16 mm right interpolar renal lesion measures Hounsfield units greater than that expected for a simple cyst on image 109/4 but incompletely evaluated on this examination but was previously characterized as a benign hemorrhagic/proteinaceous cyst on MRI 05/22/2022. Urinary bladder is unremarkable for degree of distension within limitation of streak artifact from right hip arthroplasty. Stomach/Bowel: Stomach is nondistended limiting evaluation. No pathologic dilation of small or large bowel. Colonic stool burden compatible with constipation. Sigmoid colonic  diverticulosis. Vascular/Lymphatic: Aortic and branch vessel atherosclerosis. Smooth IVC contours. Mixing artifact in the IVC at the level of the renal veins. No pathologically enlarged abdominal or pelvic lymph nodes. Reproductive: Status post hysterectomy. No adnexal masses. Other: No significant abdominopelvic free fluid. Musculoskeletal: No aggressive lytic or blastic lesion of bone. Levoconvex curvature of the lumbar spine with associated degenerative change. Right total hip arthroplasty. Degenerative change of the left hip. Chronic osseous changes of the  pubic symphysis. Degenerative change of the bilateral SI joints. IMPRESSION: 1. No evidence of recurrence of the previously treated perihilar right lower lobe pulmonary neoplasm. 2. No evidence of metastatic disease within the chest, abdomen or pelvis. 3. Slight enlargement of the lateral left lobe of the liver with questionable contour nodularity, suggest correlation with laboratory values for hepatocellular dysfunction. 4. Colonic stool burden compatible with constipation. 5. Sigmoid colonic diverticulosis without evidence of acute diverticulitis. Aortic Atherosclerosis (ICD10-I70.0) and Emphysema (ICD10-J43.9). Electronically Signed   By: Reyes Holder M.D.   On: 10/18/2023 15:43      ASSESSMENT & PLAN ARIANNA HAYDON is a 68 y.o. female with medical history significant for extensive stage small cell lung cancer who presents for a follow up visit.      # Small Cell Lung Cancer, Extensive Stage -- MRI of the brain shows no evidence of intracranial spread --Findings are currently consistent with metastatic small cell lung cancer with metastatic spread to the lymph nodes of the abdomen -- Started carboplatin , etoposide , and atezolizumab  on 11/13/2020. Transitioned to maintenance Atezolizumab  on 02/22/2021.  Plan: --today is Cycle 46 Day 1 of Atezolizumab  maintenance.  --labs today were reviewed and adequate for treatment. WBC 7.1, hemoglobin  14.2, MCV 90, platelets 152, creatinine and LFTs are stable and adequate for treatment.  -- Most recent CT CAP from 10/15/2023 which shows no evidence of recurrence. Repeat scan due in December 2025.  --Proceed with treatment without any dose modifications.  --RTC in 3 weeks for a follow up before Cycle 47  #Migraines:  --chronic and recently triggered by stress.  # Early Cirrhosis --noted on MR abdomen from 05/22/2022. US  with elastography performed on 06/18/2022. --Most recent CT scan from 10/15/2023 showed slight enlargement of lateral left lobe of liver with questionable contour nodularity. Dr. Leigh reports repeat RUQ US  in 6 months (March 2026).  #Right renal lesion: --Noted to be benign on most recent MRI of the abdomen.  #Diarrhea-improved --stool sent for C. Diff and GI pathology panel, all negative --patient following with GI and found to have pancreatic insufficiency and started on creon  with improvement of symptoms.   #Nausea: --Symptoms improved with zofran  and olanzapine .  --Added IV zofran  to infusion premeds for better symptom control.   #Hypokalemia:  --currently taking potassium chloride  20 meq BID.   --potassium level is 4.0 today. Continue PO supplementation.   #Lower extremity edema --Currently takes lasix  20 mg once daily day. --Advised to increase Lasix  to twice daily and wear compression stockings.   #Neuropathy involving fingers/feet: --Patient currently takes Cymbalta    #Supportive Care -- chemotherapy education complete -- port placed -- zofran  8mg  q8H PRN and compazine  10mg  PO q6H for nausea -- EMLA  cream for port  No orders of the defined types were placed in this encounter.  All questions were answered. The patient knows to call the clinic with any problems, questions or concerns.  I have spent a total of 30 minutes minutes of face-to-face and non-face-to-face time, preparing to see the patient, performing a medically appropriate examination,  counseling and educating the patient, ordering medications/tests, documenting clinical information in the electronic health record, and care coordination.   Norleen IVAR Kidney, MD Department of Hematology/Oncology [facility] Phone: [phone] Pager: [phone] Email: [email or web address]  11/11/2023 11:43 AM

## 2023-11-11 NOTE — Patient Instructions (Signed)
 [facility] - A DEPT OF [facility]  Discharge Instructions: Thank you for choosing [facility] to provide your oncology and hematology care.   If you have a lab appointment with the Cancer Center, please go directly to the Cancer Center and check in at the registration area.   Wear comfortable clothing and clothing appropriate for easy access to any Portacath or PICC line.   We strive to give you quality time with your provider. You may need to reschedule your appointment if you arrive late (15 or more minutes).  Arriving late affects you and other patients whose appointments are after yours.  Also, if you miss three or more appointments without notifying the office, you may be dismissed from the clinic at the provider's discretion.      For prescription refill requests, have your pharmacy contact our office and allow 72 hours for refills to be completed.    Today you received the following chemotherapy and/or immunotherapy agents: Atezolizumab  (Tecentriq )    To help prevent nausea and vomiting after your treatment, we encourage you to take your nausea medication as directed.  BELOW ARE SYMPTOMS THAT SHOULD BE REPORTED IMMEDIATELY: *FEVER GREATER THAN 100.4 F (38 C) OR HIGHER *CHILLS OR SWEATING *NAUSEA AND VOMITING THAT IS NOT CONTROLLED WITH YOUR NAUSEA MEDICATION *UNUSUAL SHORTNESS OF BREATH *UNUSUAL BRUISING OR BLEEDING *URINARY PROBLEMS (pain or burning when urinating, or frequent urination) *BOWEL PROBLEMS (unusual diarrhea, constipation, pain near the anus) TENDERNESS IN MOUTH AND THROAT WITH OR WITHOUT PRESENCE OF ULCERS (sore throat, sores in mouth, or a toothache) UNUSUAL RASH, SWELLING OR PAIN  UNUSUAL VAGINAL DISCHARGE OR ITCHING   Items with * indicate a potential emergency and should be followed up as soon as possible or go to the Emergency Department if any problems should occur.  Please show the CHEMOTHERAPY ALERT CARD or  IMMUNOTHERAPY ALERT CARD at check-in to the Emergency Department and triage nurse.  Should you have questions after your visit or need to cancel or reschedule your appointment, please contact [facility] - A DEPT OF [facility]  Dept: [phone]  and follow the prompts.  Office hours are 8:00 a.m. to 4:30 p.m. Monday - Friday. Please note that voicemails left after 4:00 p.m. may not be returned until the following business day.  We are closed weekends and major holidays. You have access to a nurse at all times for urgent questions. Please call the main number to the clinic Dept: [phone] and follow the prompts.   For any non-urgent questions, you may also contact your provider using MyChart. We now offer e-Visits for anyone 53 and older to request care online for non-urgent symptoms. For details visit [email or web address].   Also download the MyChart app! Go to the app store, search MyChart, open the app, select [facility], and log in with your MyChart username and password.

## 2023-11-12 LAB — T4: T4, Total: 9.6 ug/dL (ref 4.5–12.0)

## 2023-11-14 ENCOUNTER — Other Ambulatory Visit: Payer: Self-pay | Admitting: *Deleted

## 2023-11-14 MED ORDER — MECLIZINE HCL 12.5 MG PO TABS: 12.5000 mg | ORAL_TABLET | Freq: Two times a day (BID) | ORAL | 2 refills | Status: DC

## 2023-11-19 ENCOUNTER — Encounter (HOSPITAL_BASED_OUTPATIENT_CLINIC_OR_DEPARTMENT_OTHER): Payer: Self-pay

## 2023-11-26 ENCOUNTER — Other Ambulatory Visit: Payer: Self-pay | Admitting: Physical Medicine and Rehabilitation

## 2023-11-26 ENCOUNTER — Encounter: Attending: Physical Medicine and Rehabilitation | Admitting: Physical Medicine and Rehabilitation

## 2023-11-26 ENCOUNTER — Encounter: Payer: Self-pay | Admitting: Physical Medicine and Rehabilitation

## 2023-11-26 VITALS — BP 124/81 | HR 90 | Ht 62.0 in | Wt 178.0 lb

## 2023-11-26 DIAGNOSIS — Z5181 Encounter for therapeutic drug level monitoring: Secondary | ICD-10-CM | POA: Insufficient documentation

## 2023-11-26 DIAGNOSIS — G894 Chronic pain syndrome: Secondary | ICD-10-CM | POA: Insufficient documentation

## 2023-11-26 DIAGNOSIS — R29898 Other symptoms and signs involving the musculoskeletal system: Secondary | ICD-10-CM | POA: Insufficient documentation

## 2023-11-26 DIAGNOSIS — Z79891 Long term (current) use of opiate analgesic: Secondary | ICD-10-CM | POA: Diagnosis not present

## 2023-11-26 DIAGNOSIS — G629 Polyneuropathy, unspecified: Secondary | ICD-10-CM | POA: Diagnosis not present

## 2023-11-26 DIAGNOSIS — C3490 Malignant neoplasm of unspecified part of unspecified bronchus or lung: Secondary | ICD-10-CM | POA: Diagnosis not present

## 2023-11-26 DIAGNOSIS — G43119 Migraine with aura, intractable, without status migrainosus: Secondary | ICD-10-CM | POA: Diagnosis not present

## 2023-11-26 MED ORDER — HYDROMORPHONE HCL 2 MG PO TABS: 2.0000 mg | ORAL_TABLET | Freq: Four times a day (QID) | ORAL | 0 refills | Status: DC | PRN

## 2023-11-26 MED ORDER — ARIPIPRAZOLE 5 MG PO TABS: 5.0000 mg | ORAL_TABLET | Freq: Every day | ORAL | 5 refills | Status: AC

## 2023-11-26 MED ORDER — LEVOTHYROXINE SODIUM 137 MCG PO TABS: 137.0000 ug | ORAL_TABLET | Freq: Every day | ORAL | 3 refills | Status: DC

## 2023-11-26 MED ORDER — TOPIRAMATE 100 MG PO TABS: 100.0000 mg | ORAL_TABLET | Freq: Every day | ORAL | 1 refills | Status: AC

## 2023-11-26 NOTE — Progress Notes (Signed)
 Subjective:    Patient ID: [name], female    DOB: 05/27/1955, 68 y.o.   MRN: [id number]  HPI  Pt is a 68 yr old female with hx of newly dx'd COPD, still smoking, HTN, DM- diet controlled; anxiety and depression;  Scoliosis and deg back issues,  Generalized OA;  chronic pain- here for f/u of chronic pain issues. With new 2.2 cm R lower pulmonary nodule and hemorrhoids.  Now has dx of Small cell lung cancer extensive stage 4 per oncology note. Here for f/u on chronic pain.     EMG/NCS  showed as of 10/20/23 BL L5  RADICULOPATHY with signs of reinnervation on R somewhat- and B/L axonal polyneuropathy- In LE's  Tingling in B/L thumbs and 4th/5th digits- bad- cannot peel potato- because lack of feeling. As well as LE's.   Had therapy for R big toe- was working with ankle-  and sometimes problem going from gas to brake pedal- allows plenty of stopping distance.    No more issues with using meds inappropriately/accidentally. Since changed the way she manages meds.   Still doing chemo q3 weeks- gets sick 4 days prior and ever since took Chemo- still not over nausea from last tx, and next one is due 12/02/23 Taking Bentyl  and Zofran  like crazy-  Ready to pull hair out.   Lives with two buttheads Only thing in life is brother and dog.   Brother needs MRI cardiac- but cannot get MRI due to clip in head and bother is getting sicker daily from CHF.    Cannot do it all- with Husband not doing anything to pay for anything, etc.       Pain Inventory Average Pain 7 Pain Right Now 7 My pain is constant, sharp, burning, stabbing, and aching  In the last 24 hours, has pain interfered with the following? General activity 7 Relation with others 7 Enjoyment of life 9 What TIME of day is your pain at its worst? morning  and evening Sleep (in general) Fair  Pain is worse with: walking, bending, and standing Pain improves with: medication and injections Relief from Meds:  good  Family History  Problem Relation Age of Onset   COPD Mother    Heart disease Mother    Lung disease Father        Asbestosis   Heart attack Father    Heart disease Father    Cerebral aneurysm Brother    Aneurysm Brother        Brain   Drug abuse Daughter    Epilepsy Son    Alcohol abuse Son    Drug abuse Son    Arthritis Maternal Grandmother    Heart disease Maternal Grandmother    Asthma Maternal Grandfather    Cancer Maternal Grandfather    Arthritis Paternal Grandmother    Heart disease Paternal Grandmother    Stroke Paternal Grandmother    Early death Paternal Grandfather    Heart disease Paternal Grandfather    Social History   Socioeconomic History   Marital status: Married    Spouse name: Not on file   Number of children: 2   Years of education: Not on file   Highest education level: 12th grade  Occupational History   Occupation: disabled   Occupation: disabled  Tobacco Use   Smoking status: Every Day    Current packs/day: 2.00    Average packs/day: 2.0 packs/day for 46.0 years (92.0 ttl pk-yrs)    Types: Cigarettes  Smokeless tobacco: Never   Tobacco comments:    2 packs of cigarettes smoked daily 12/13/21- declines smoking cessation  Vaping Use   Vaping status: Never Used  Substance and Sexual Activity   Alcohol use: No   Drug use: No   Sexual activity: Not Currently    Partners: Male  Other Topics Concern   Not on file  Social History Narrative   Right handed    Caffeine~ 2 cups per day    Lives at home with husband (strained relationship)   Primary caretaker for disabled brother who had aneurism   Daughter died 2018/06/02   Social Drivers of Corporate Investment Banker Strain: Low Risk  (07/31/2023)   [facility]   Overall Financial Resource Strain (CARDIA)    How hard is it for you to pay for the very basics like food, housing, medical care, and heating?: Not very hard  Food Insecurity: No Food Insecurity (07/31/2023)    Received from [facility]   Hunger Vital Sign    Within the past 12 months, you worried that your food would run out before you got the money to buy more.: Never true    Within the past 12 months, the food you bought just didn't last and you didn't have money to get more.: Never true  Recent Concern: Food Insecurity - Food Insecurity Present (05/20/2023)   Hunger Vital Sign    Worried About Running Out of Food in the Last Year: Sometimes true    Ran Out of Food in the Last Year: Sometimes true  Transportation Needs: No Transportation Needs (07/31/2023)   Received from [facility]   PRAPARE - Transportation    Lack of Transportation (Medical): No    Lack of Transportation (Non-Medical): No  Physical Activity: Inactive (05/20/2023)   Exercise Vital Sign    Days of Exercise per Week: 0 days    Minutes of Exercise per Session: 0 min  Stress: No Stress Concern Present (05/20/2023)   [employer] of Occupational Health - Occupational Stress Questionnaire    Feeling of Stress : Only a little  Recent Concern: Stress - Stress Concern Present (03/31/2023)   [employer] of Occupational Health - Occupational Stress Questionnaire    Feeling of Stress : Very much  Social Connections: Moderately Integrated (05/20/2023)   Social Connection and Isolation Panel    Frequency of Communication with Friends and Family: More than three times a week    Frequency of Social Gatherings with Friends and Family: More than three times a week    Attends Religious Services: 1 to 4 times per year    Active Member of [employer] or Organizations: No    Attends Banker Meetings: Never    Marital Status: Married   Past Surgical History:  Procedure Laterality Date   APPENDECTOMY     1985   BIOPSY  07/24/2017   Procedure: BIOPSY;  Surgeon: [name], MD;  Location: [facility];  Service: Endoscopy;;   CARDIAC CATHETERIZATION N/A 10/31/2015   Procedure: Left Heart Cath and Coronary  Angiography;  Surgeon: Alm LELON Clay, MD;  Location: [facility] INVASIVE CV LAB;  Service: Cardiovascular;  Laterality: N/A;   CARPAL TUNNEL RELEASE Left    CARPAL TUNNEL RELEASE Right    CATARACT EXTRACTION W/PHACO Left 02/07/2023   Procedure: CATARACT EXTRACTION PHACO AND INTRAOCULAR LENS PLACEMENT (IOC);  Surgeon: Juli Blunt, MD;  [facility];  Service: Ophthalmology;  Laterality: Left;  CDE: 3.51   CATARACT EXTRACTION W/PHACO Right 02/21/2023   Procedure: CATARACT EXTRACTION PHACO AND INTRAOCULAR LENS PLACEMENT (IOC);  Surgeon: Juli Blunt, MD;  Location: [facility];  Service: Ophthalmology;  Laterality: Right;  CDE: 2.94   CHOLECYSTECTOMY  late 1980's   COLONOSCOPY WITH PROPOFOL  N/A 07/24/2017   Procedure: COLONOSCOPY WITH PROPOFOL ;  Surgeon: [name], MD;  [facility];  Service: Endoscopy;  Laterality: N/A;   ESOPHAGOGASTRODUODENOSCOPY N/A 07/24/2017   Procedure: ESOPHAGOGASTRODUODENOSCOPY (EGD);  Surgeon: [name], MD;  Location: [facility];  Service: Endoscopy;  Laterality: N/A;   ESOPHAGOGASTRODUODENOSCOPY (EGD) WITH PROPOFOL  N/A 10/19/2020   Procedure: ESOPHAGOGASTRODUODENOSCOPY (EGD) WITH PROPOFOL ;  Surgeon: [name], MD;  [facility];  Service: Endoscopy;  Laterality: N/A;   FINE NEEDLE ASPIRATION N/A 10/19/2020   Procedure: FINE NEEDLE ASPIRATION (FNA) LINEAR;  Surgeon: [name], MD;  [facility];  Service: Endoscopy;  Laterality: N/A;   GALLBLADDER SURGERY  1991   HIP CLOSED REDUCTION Right 01/08/2016   Procedure: CLOSED MANIPULATION HIP;  Surgeon: Reyes Billing, MD;  [facility];  Service: Orthopedics;  Laterality: Right;   HIP CLOSED REDUCTION Right 01/19/2016   Procedure: ATTEMPTED CLOSED REDUCTION RIGHT HIP;  Surgeon: Norleen Armor, MD;  [facility];  Service: Orthopedics;  Laterality: Right;   HIP CLOSED REDUCTION Right 01/20/2016   Procedure: CLOSED REDUCTION RIGHT TOTAL HIP;  Surgeon: Donnice Car,  MD;  [facility];  Service: Orthopedics;  Laterality: Right;   HIP CLOSED REDUCTION Right 02/17/2016   Procedure: CLOSED REDUCTION RIGHT TOTAL HIP;  Surgeon: Redell Shoals, MD;  [facility];  Service: Orthopedics;  Laterality: Right;   HIP CLOSED REDUCTION Right 02/28/2016   Procedure: CLOSED REDUCTION HIP;  Surgeon: Selinda Belvie Gosling, MD;  [facility];  Service: Orthopedics;  Laterality: Right;   IR IMAGING GUIDED PORT INSERTION  11/01/2020   POLYPECTOMY  07/24/2017   Procedure: POLYPECTOMY;  Surgeon: [name], MD;  [facility];  Service: Endoscopy;;   TONSILLECTOMY     TOTAL ABDOMINAL HYSTERECTOMY     1985, with 1 ovary removed and 2 nd ovary removed 2003   TOTAL HIP ARTHROPLASTY Right    Original surgery 2006 with revision 2010   TOTAL HIP REVISION Right 01/01/2016   Procedure: TOTAL HIP REVISION;  Surgeon: Donnice Car, MD;  [facility];  Service: Orthopedics;  Laterality: Right;   TOTAL HIP REVISION Right 03/02/2016   Procedure: TOTAL HIP REVISION;  Surgeon: Car Donnice, MD;  [facility];  Service: Orthopedics;  Laterality: Right;   TOTAL HIP REVISION Right 09/02/2016   Procedure: Right hip constrained liner- posterior;  Surgeon: Car Donnice, MD;  [facility];  Service: Orthopedics;  Laterality: Right;   ULNAR NERVE TRANSPOSITION Right    UPPER ESOPHAGEAL ENDOSCOPIC ULTRASOUND (EUS) N/A 10/19/2020   Procedure: UPPER ESOPHAGEAL ENDOSCOPIC ULTRASOUND (EUS);  Surgeon: [name], MD;  Location: [facility];  Service: Endoscopy;  Laterality: N/A;  periduodenal lesion   Past Surgical History:  Procedure Laterality Date   APPENDECTOMY     1985   BIOPSY  07/24/2017   Procedure: BIOPSY;  Surgeon: [name], MD;  [facility];  Service: Endoscopy;;   CARDIAC CATHETERIZATION N/A 10/31/2015   Procedure: Left Heart Cath and Coronary Angiography;  Surgeon: Alm LELON Clay, MD;  Location: [facility] INVASIVE CV LAB;  Service:  Cardiovascular;  Laterality: N/A;   CARPAL TUNNEL RELEASE Left    CARPAL TUNNEL RELEASE  Right    CATARACT EXTRACTION W/PHACO Left 02/07/2023   Procedure: CATARACT EXTRACTION PHACO AND INTRAOCULAR LENS PLACEMENT (IOC);  Surgeon: Juli Blunt, MD;  [facility];  Service: Ophthalmology;  Laterality: Left;  CDE: 3.51   CATARACT EXTRACTION W/PHACO Right 02/21/2023   Procedure: CATARACT EXTRACTION PHACO AND INTRAOCULAR LENS PLACEMENT (IOC);  Surgeon: Juli Blunt, MD;  [facility];  Service: Ophthalmology;  Laterality: Right;  CDE: 2.94   CHOLECYSTECTOMY  late 1980's   COLONOSCOPY WITH PROPOFOL  N/A 07/24/2017   Procedure: COLONOSCOPY WITH PROPOFOL ;  Surgeon: [name], MD;  [facility];  Service: Endoscopy;  Laterality: N/A;   ESOPHAGOGASTRODUODENOSCOPY N/A 07/24/2017   Procedure: ESOPHAGOGASTRODUODENOSCOPY (EGD);  Surgeon: [name], MD;  Location: [facility];  Service: Endoscopy;  Laterality: N/A;   ESOPHAGOGASTRODUODENOSCOPY (EGD) WITH PROPOFOL  N/A 10/19/2020   Procedure: ESOPHAGOGASTRODUODENOSCOPY (EGD) WITH PROPOFOL ;  Surgeon: [name], MD;  [facility];  Service: Endoscopy;  Laterality: N/A;   FINE NEEDLE ASPIRATION N/A 10/19/2020   Procedure: FINE NEEDLE ASPIRATION (FNA) LINEAR;  Surgeon: [name], MD;  [facility];  Service: Endoscopy;  Laterality: N/A;   GALLBLADDER SURGERY  1991   HIP CLOSED REDUCTION Right 01/08/2016   Procedure: CLOSED MANIPULATION HIP;  Surgeon: Reyes Billing, MD;  [facility];  Service: Orthopedics;  Laterality: Right;   HIP CLOSED REDUCTION Right 01/19/2016   Procedure: ATTEMPTED CLOSED REDUCTION RIGHT HIP;  Surgeon: Norleen Armor, MD;  [facility];  Service: Orthopedics;  Laterality: Right;   HIP CLOSED REDUCTION Right 01/20/2016   Procedure: CLOSED REDUCTION RIGHT TOTAL HIP;  Surgeon: Donnice Car, MD;  [facility];  Service: Orthopedics;  Laterality: Right;   HIP CLOSED  REDUCTION Right 02/17/2016   Procedure: CLOSED REDUCTION RIGHT TOTAL HIP;  Surgeon: Redell Shoals, MD;  [facility];  Service: Orthopedics;  Laterality: Right;   HIP CLOSED REDUCTION Right 02/28/2016   Procedure: CLOSED REDUCTION HIP;  Surgeon: Selinda Belvie Gosling, MD;  [facility];  Service: Orthopedics;  Laterality: Right;   IR IMAGING GUIDED PORT INSERTION  11/01/2020   POLYPECTOMY  07/24/2017   Procedure: POLYPECTOMY;  Surgeon: [name], MD;  [facility];  Service: Endoscopy;;   TONSILLECTOMY     TOTAL ABDOMINAL HYSTERECTOMY     1985, with 1 ovary removed and 2 nd ovary removed 2003   TOTAL HIP ARTHROPLASTY Right    Original surgery 2006 with revision 2010   TOTAL HIP REVISION Right 01/01/2016   Procedure: TOTAL HIP REVISION;  Surgeon: Donnice Car, MD;  [facility];  Service: Orthopedics;  Laterality: Right;   TOTAL HIP REVISION Right 03/02/2016   Procedure: TOTAL HIP REVISION;  Surgeon: Car Donnice, MD;  [facility];  Service: Orthopedics;  Laterality: Right;   TOTAL HIP REVISION Right 09/02/2016   Procedure: Right hip constrained liner- posterior;  Surgeon: Car Donnice, MD;  [facility];  Service: Orthopedics;  Laterality: Right;   ULNAR NERVE TRANSPOSITION Right    UPPER ESOPHAGEAL ENDOSCOPIC ULTRASOUND (EUS) N/A 10/19/2020   Procedure: UPPER ESOPHAGEAL ENDOSCOPIC ULTRASOUND (EUS);  Surgeon: [name], MD;  Location: [facility];  Service: Endoscopy;  Laterality: N/A;  periduodenal lesion   Past Medical History:  Diagnosis Date   Allergic rhinitis    Anemia    Anxiety    Chicken pox    Chronic back pain    COPD (chronic obstructive pulmonary disease) (HCC)    Depression    DM (diabetes  mellitus) (HCC)    Essential hypertension    GERD (gastroesophageal reflux disease)    Headache    migraines   History of gastritis    EGD 2015   History of home oxygen  therapy    2 liters at hs last 6 months   Hyperlipidemia     Hypothyroidism    Migraines    Osteoarthritis    oa   Scoliosis    Small cell lung cancer (HCC)    Stage 4   Ht 5' 2 (1.575 m)   Wt 178 lb (80.7 kg)   BMI 32.56 kg/m   Opioid Risk Score:   Fall Risk Score:  `1  Depression screen PHQ 2/9     10/21/2023   12:00 PM 10/20/2023    2:54 PM 09/30/2023    1:01 PM 08/18/2023   11:04 AM 08/08/2023    9:22 AM 05/28/2023   11:52 AM 05/20/2023    1:11 PM  Depression screen PHQ 2/9  Decreased Interest 0 0 0 2 1 1 1   Down, Depressed, Hopeless 0 0 0  1 1 1   PHQ - 2 Score 0 0 0 2 2 2 2   Altered sleeping    0   0  Tired, decreased energy    1   1  Change in appetite    1   0  Feeling bad or failure about yourself        1  Trouble concentrating       0  Moving slowly or fidgety/restless       0  Suicidal thoughts       0  PHQ-9 Score    4   4  Difficult doing work/chores       Not difficult at all    Review of Systems  Musculoskeletal:  Positive for back pain.       Pain in both wrist & hands,feet & knees       Objective:   Physical Exam  Awake, alert, appropriate, NAD Losing some hair Moving a little better-  RLE> LLE weakness- no change from last visit.         Assessment & Plan:   Pt is a 68 yr old female with hx of newly dx'd COPD, still smoking, HTN, DM- diet controlled; anxiety and depression;  Scoliosis and deg back issues,  Generalized OA;  chronic pain- here for f/u of chronic pain issues. With new 2.2 cm R lower pulmonary nodule and hemorrhoids.  Now has dx of Small cell lung cancer extensive stage 4 per oncology note- receiving Chemo q3 weeks now. Here for f/u on chronic pain.     LE weakness- due to polyneuropathy- and radiculopathy- at L5 B/L - given written Rx for physical Therapy outpt in [place]  to learn home exercise program-   2.  We discussed having peace!- write down what you need to do- and do  1 step at a time.   3. Con't pain meds - - but wants to try and switch pain meds up-  I agree it's worth it-  will try switching to Dilaudid - (have tried Oxy in past- no results)- will try Dilaudid  2 mg 4x/day as needed for pain- #120- and if more effective, will continue for awhile- if not, will go back to Norco 10/325 mg- 6x/day   4. Oral drug screen due today- per clinic policy.   5. Went over EMG/NCS results with pt- radiculopathy and polyneuropathy.    6.  F/U as scheduled/q 2 months-  F/u on chronic pain due to cancer  7. TSH is 0.103- is a little low actually- has bene a little low for past 4 months- can make you feel anxious/heart racing, etc.   8. Con't Abilify  5 mg at bedtime- sent to pharmacy for 6months  9 let me know if needs other refills  10. Will change Synthroid  to 112 mcg/day- for thyroid - so won't have such a low TSH.    I spent a total of 41   minutes on total care today- >50% coordination of care- due to d/w pt about synthroid  dosing- went over thyroid  labs; d/w pt about pain meds- changed that up- refilled meds and went over EMG/NCS with pt -also drug screen done.

## 2023-11-26 NOTE — Patient Instructions (Addendum)
 Pt is a 68 yr old female with hx of newly dx'd COPD, still smoking, HTN, DM- diet controlled; anxiety and depression;  Scoliosis and deg back issues,  Generalized OA;  chronic pain- here for f/u of chronic pain issues. With new 2.2 cm R lower pulmonary nodule and hemorrhoids.  Now has dx of Small cell lung cancer extensive stage 4 per oncology note- receiving Chemo q3 weeks now. Here for f/u on chronic pain.     LE weakness- due to polyneuropathy- and radiculopathy- at L5 B/L - given written Rx for physical Therapy outpt in [place]  to learn home exercise program-   2.  We discussed having peace!- write down what you need to do- and do  1 step at a time.   3. Con't pain meds - - but wants to try and switch pain meds up-  I agree it's worth it- will try switching to Dilaudid - (have tried Oxy in past- no results)- will try Dilaudid  2 mg 4x/day as needed for pain- #120- and if more effective, will continue for awhile- if not, will go back to Norco 10/325 mg- 6x/day   4. Oral drug screen due today- per clinic policy.   5. Went over EMG/NCS results with pt- radiculopathy and polyneuropathy.    6.  F/U as scheduled/q 2months-  F/u on chronic pain due to cancer  7. TSH is 0.103- is a little low actually- has bene a little low for past 4 months- can make you feel anxious/heart racing, etc.  8. Con't Abilify  5 mg at bedtime- sent to pharmacy for 6months  9 let me know if needs other refills  10. Will change Synthroid  to 112 mcg/day- for thyroid - so won't have such a low TSH.

## 2023-11-30 LAB — DRUG TOX MONITOR 1 W/CONF, ORAL FLD
AMINOCLONAZEPAM: NEGATIVE ng/mL (ref <0.50)
Alprazolam: 8.04 ng/mL — ABNORMAL HIGH (ref <0.50)
Amphetamines: NEGATIVE ng/mL (ref <10)
Barbiturates: NEGATIVE ng/mL (ref <10)
Benzodiazepines: POSITIVE ng/mL — AB (ref <0.50)
Buprenorphine: NEGATIVE ng/mL (ref <0.10)
Chlordiazepoxide: NEGATIVE ng/mL (ref <0.50)
Clonazepam: NEGATIVE ng/mL (ref <0.50)
Cocaine: NEGATIVE ng/mL (ref <5.0)
Codeine: NEGATIVE ng/mL (ref <2.5)
Cotinine: 239.7 ng/mL — ABNORMAL HIGH (ref <5.0)
Diazepam: NEGATIVE ng/mL (ref <0.50)
Dihydrocodeine: 23.3 ng/mL — ABNORMAL HIGH (ref <2.5)
Fentanyl: NEGATIVE ng/mL (ref <0.10)
Flunitrazepam: NEGATIVE ng/mL (ref <0.50)
Flurazepam: NEGATIVE ng/mL (ref <0.50)
Heroin Metabolite: NEGATIVE ng/mL (ref <1.0)
Hydrocodone: 248.4 ng/mL — ABNORMAL HIGH (ref <2.5)
Hydromorphone: NEGATIVE ng/mL (ref <2.5)
Lorazepam: NEGATIVE ng/mL (ref <0.50)
MARIJUANA: NEGATIVE ng/mL (ref <2.5)
MDMA: NEGATIVE ng/mL (ref <10)
Meprobamate: NEGATIVE ng/mL (ref <2.5)
Methadone: NEGATIVE ng/mL (ref <5.0)
Midazolam: NEGATIVE ng/mL (ref <0.50)
Morphine: NEGATIVE ng/mL (ref <2.5)
Nicotine Metabolite: POSITIVE ng/mL — AB (ref <5.0)
Nordiazepam: NEGATIVE ng/mL (ref <0.50)
Norhydrocodone: 20.7 ng/mL — ABNORMAL HIGH (ref <2.5)
Noroxycodone: NEGATIVE ng/mL (ref <2.5)
Opiates: POSITIVE ng/mL — AB (ref <2.5)
Oxazepam: NEGATIVE ng/mL (ref <0.50)
Oxycodone: NEGATIVE ng/mL (ref <2.5)
Oxymorphone: NEGATIVE ng/mL (ref <2.5)
Phencyclidine: NEGATIVE ng/mL (ref <10)
Tapentadol: NEGATIVE ng/mL (ref <5.0)
Temazepam: NEGATIVE ng/mL (ref <0.50)
Tramadol: NEGATIVE ng/mL (ref <5.0)
Triazolam: NEGATIVE ng/mL (ref <0.50)
Zolpidem: NEGATIVE ng/mL (ref <5.0)

## 2023-11-30 LAB — DRUG TOX ALC METAB W/CON, ORAL FLD: Alcohol Metabolite: NEGATIVE ng/mL (ref <25)

## 2023-12-02 NOTE — Progress Notes (Unsigned)
 [facility] [phone]   [phone]  PROGRESS NOTE  Patient Care Team: [name], FNP as PCP - General (Family Medicine) O'Neal, Darryle Ned, MD as PCP - Cardiology (Cardiology) [name], MD (Inactive) as Attending Physician (Gastroenterology) [name], MD as Consulting Physician (Orthopedic Surgery) [name], MD as Consulting Physician (Orthopedic Surgery) [name], MD (Inactive) as Consulting Physician (Neurology) [name], MD as Consulting Physician (Neurology) [name], [facility] (Inactive) (Pharmacist) [facility]ll[facility], [facility]   Hematological/Oncological History # Small Cell Lung Cancer, Extensive Stage 07/05/2020: CT abdomen for lower abdominal pain. New pulmonary nodular density noted 07/06/2020: CT chest showed 2.2 cm macrolobulated right lower lobe pulmonary nodule (favored) versus pathologically enlarged infrahilar lymph node 10/13/2020: PET CT scan performed, findings show 2 cm right lower lobe lung mass is hypermetabolic and consistent with primary lung neoplasm. Additionally found hypermetabolic 17 mm soft tissue lesion between the descending duodenum and the pancreatic head  10/19/2020: EGD to biopsy hypermetabolic lymph node. Biopsy results show small cell lung cancer 10/26/2020: establish care with Dr. Federico  11/13/2020: Cycle 1 Day 1 of Carbo/Etop/Atezolizumab  12/04/2020: Cycle 2 Day 1 of Carbo/Etop/Atezolizumab  11/22-11/25/2022: admitted for E. Coli bacteremia/sepsis. Start of Cycle 3 delayed. 01/02/2021: Cycle 3 Day 1 of Carbo/Etop/Atezolizumab  01/23/2021: Cycle 4 Day 1 of Carbo/Etop/Atezolizumab  02/22/2021: Cycle 5 Day 1 of Atezolizumab  Maintenance. Delayed start due to patient's COVID infection.  03/14/2021: Cycle 6 Day 1 of Atezolizumab  Maintenance 04/05/2021: Cycle 7 Day 1 of Atezolizumab  Maintenance 04/25/2021: Cycle 8 Day 1 of Atezolizumab   Maintenance 05/16/2021: Cycle 9 Day 1 of Atezolizumab  Maintenance 06/06/2021: Cycle 10 Day 1 of Atezolizumab  Maintenance 06/27/2021:  Cycle 11 Day 1 of Atezolizumab  Maintenance 07/18/2021: Cycle 12 Day 1 of Atezolizumab  Maintenance 08/08/2021: Cycle 13 Day 1 of Atezolizumab  Maintenance 08/29/2021: Cycle 14 Day 1 of Atezolizumab  Maintenance 09/19/2021: Cycle 15 Day 1 of Atezolizumab  Maintenance 10/10/2021: Cycle 16 Day 1 of Atezolizumab  Maintenance 10/31/2021: Cycle 17 Day 1 of Atezolizumab  Maintenance 11/21/2021: Cycle 18 Day 1 of Atezolizumab  Maintenance 12/12/2021: Cycle 19 Day 1 of Atezolizumab  Maintenance 01/02/2022: Cycle 20 Day 1 of Atezolizumab  Maintenance 01/23/2022: Cycle 21 Day 1 of Atezolizumab  Maintenance 02/13/2022: treatment HELD due to diarrhea.  03/06/2022: Cycle 22 Day 1 of Atezolizumab  Maintenance 03/27/2022: Cycle 23 Day 1 of Atezolizumab  Maintenance 04/17/2022: Cycle 24 Day 1 of Atezolizumab  Maintenance 05/08/2022:  Cycle 25 Day 1 of Atezolizumab  Maintenance (Held due to patient preference for treatment holiday) 05/29/2022: Cycle 25 Day 1 of Atezolizumab  Maintenance  06/19/2022: Cycle 26 Day 1 of Atezolizumab  Maintenance  07/12/2022: Cycle 27 Day 1 of Atezolizumab  Maintenance 07/31/2022: Cycle 28 Day 1 of Atezolizumab  Maintenance HELD per patient request for fatigue.  08/22/2022: Cycle 28 Day 1 of Atezolizumab  Maintenance 09/11/2022: Cycle 29 Day 1 of Atezolizumab  Maintenance 10/03/2022: Cycle 30 Day 1 of Atezolizumab  Maintenance 10/23/2022: Cycle 31 Day 1 of Atezolizumab  Maintenance 11/13/2022: Cycle 32 Day 1 of Atezolizumab  Maintenance 12/24/2022: Cycle 33 Day 1 of Atezolizumab  Maintenance 01/15/2023: Cycle 34 Day 1 of Atezolizumab  Maintenance 02/05/2023: Cycle 35 Day 1 of Atezolizumab  Maintenance 02/26/2023: Cycle 36 Day 1 of Atezolizumab  Maintenance 04/28/2023: Cycle 37 Day 1 of Atezolizumab  Maintenance 04/30/2023: Cycle 38 Day 1 of Atezolizumab  Maintenance 05/21/2023: Cycle 39 Day 1 of  Atezolizumab  Maintenance 06/11/2023: Cycle 40 Day 1 of Atezolizumab  Maintenance 07/28/2023: Cycle 41 Day 1 of Atezolizumab  Maintenance 08/18/2023: Cycle 42 Day 1 of Atezolizumab  Maintenance 09/08/2023: Cycle 43 Day 1 of Atezolizumab  Maintenance 09/30/2023:  Cycle  44 Day 1 of Atezolizumab  Maintenance 10/21/2023: Cycle 45 Day 1 of Atezolizumab  Maintenance 11/11/2023: Cycle 46 Day 1 of Atezolizumab  Maintenance 12/03/2023: Cycle 47 Day 1 of Atezolizumab  Maintenance  Interval History:  [name] 68 y.o. female with medical history significant for extensive stage small cell lung cancer who presents for a follow up visit. The patient's last visit was on 11/11/2023.  She presents today to start cycle 47 of maintenance atezolizumab . She is unaccompanied for this visit.   On exam today [name] reports she developed more nausea and diarrhea after the last visit.  She reports her nausea did improve some with Zofran  but she had breakthrough symptoms.  The diarrhea is periodic and she takes Imodium  as needed.  Her energy levels are fairly stable but adds that stress at home is contributing to this.  She denies easy bruising or signs of active bleeding.  Patient denies fevers, chills, night sweats, chest pain or cough.  She has no other complaints.  Rest of the 10 point ROS as below.  MEDICAL HISTORY:  Past Medical History:  Diagnosis Date   Allergic rhinitis    Anemia    Anxiety    Chicken pox    Chronic back pain    COPD (chronic obstructive pulmonary disease) (HCC)    Depression    DM (diabetes mellitus) (HCC)    Essential hypertension    GERD (gastroesophageal reflux disease)    Headache    migraines   History of gastritis    EGD 2015   History of home oxygen  therapy    2 liters at hs last 6 months   Hyperlipidemia    Hypothyroidism    Migraines    Osteoarthritis    oa   Scoliosis    Small cell lung cancer (HCC)    Stage 4    SURGICAL HISTORY: Past Surgical History:  Procedure  Laterality Date   APPENDECTOMY     1985   BIOPSY  07/24/2017   Procedure: BIOPSY;  Surgeon: [name], MD;  Location: [facility];  Service: Endoscopy;;   CARDIAC CATHETERIZATION N/A 10/31/2015   Procedure: Left Heart Cath and Coronary Angiography;  Surgeon: Alm LELON Clay, MD;  Location: [facility] INVASIVE CV LAB;  Service: Cardiovascular;  Laterality: N/A;   CARPAL TUNNEL RELEASE Left    CARPAL TUNNEL RELEASE Right    CATARACT EXTRACTION W/PHACO Left 02/07/2023   Procedure: CATARACT EXTRACTION PHACO AND INTRAOCULAR LENS PLACEMENT (IOC);  Surgeon: Juli Blunt, MD;  Location: [facility];  Service: Ophthalmology;  Laterality: Left;  CDE: 3.51   CATARACT EXTRACTION W/PHACO Right 02/21/2023   Procedure: CATARACT EXTRACTION PHACO AND INTRAOCULAR LENS PLACEMENT (IOC);  Surgeon: Juli Blunt, MD;  Location: [facility];  Service: Ophthalmology;  Laterality: Right;  CDE: 2.94   CHOLECYSTECTOMY  late 1980's   COLONOSCOPY WITH PROPOFOL  N/A 07/24/2017   Procedure: COLONOSCOPY WITH PROPOFOL ;  Surgeon: [name], MD;  Location: [facility];  Service: Endoscopy;  Laterality: N/A;   ESOPHAGOGASTRODUODENOSCOPY N/A 07/24/2017   Procedure: ESOPHAGOGASTRODUODENOSCOPY (EGD);  Surgeon: [name], MD;  Location: [facility];  Service: Endoscopy;  Laterality: N/A;   ESOPHAGOGASTRODUODENOSCOPY (EGD) WITH PROPOFOL  N/A 10/19/2020   Procedure: ESOPHAGOGASTRODUODENOSCOPY (EGD) WITH PROPOFOL ;  Surgeon: [name], MD;  Location: [facility];  Service: Endoscopy;  Laterality: N/A;   FINE NEEDLE ASPIRATION N/A 10/19/2020   Procedure: FINE NEEDLE ASPIRATION (FNA) LINEAR;  Surgeon: [name], MD;  Location: [facility];  Service: Endoscopy;  Laterality: N/A;  GALLBLADDER SURGERY  1991   HIP CLOSED REDUCTION Right 01/08/2016   Procedure: CLOSED MANIPULATION HIP;  Surgeon: Reyes Billing, MD;  Location: [facility];  Service: Orthopedics;  Laterality: Right;   HIP CLOSED REDUCTION Right 01/19/2016    Procedure: ATTEMPTED CLOSED REDUCTION RIGHT HIP;  Surgeon: Norleen Armor, MD;  Location: [facility];  Service: Orthopedics;  Laterality: Right;   HIP CLOSED REDUCTION Right 01/20/2016   Procedure: CLOSED REDUCTION RIGHT TOTAL HIP;  Surgeon: Donnice Car, MD;  Location: [facility];  Service: Orthopedics;  Laterality: Right;   HIP CLOSED REDUCTION Right 02/17/2016   Procedure: CLOSED REDUCTION RIGHT TOTAL HIP;  Surgeon: Redell Shoals, MD;  Location: [facility];  Service: Orthopedics;  Laterality: Right;   HIP CLOSED REDUCTION Right 02/28/2016   Procedure: CLOSED REDUCTION HIP;  Surgeon: Selinda Belvie Gosling, MD;  Location: [facility];  Service: Orthopedics;  Laterality: Right;   IR IMAGING GUIDED PORT INSERTION  11/01/2020   POLYPECTOMY  07/24/2017   Procedure: POLYPECTOMY;  Surgeon: [name], MD;  Location: [facility];  Service: Endoscopy;;   TONSILLECTOMY     TOTAL ABDOMINAL HYSTERECTOMY     1985, with 1 ovary removed and 2 nd ovary removed 2003   TOTAL HIP ARTHROPLASTY Right    Original surgery 2006 with revision 2010   TOTAL HIP REVISION Right 01/01/2016   Procedure: TOTAL HIP REVISION;  Surgeon: Donnice Car, MD;  Location: [facility];  Service: Orthopedics;  Laterality: Right;   TOTAL HIP REVISION Right 03/02/2016   Procedure: TOTAL HIP REVISION;  Surgeon: Car Donnice, MD;  Location: [facility];  Service: Orthopedics;  Laterality: Right;   TOTAL HIP REVISION Right 09/02/2016   Procedure: Right hip constrained liner- posterior;  Surgeon: Car Donnice, MD;  Location: [facility];  Service: Orthopedics;  Laterality: Right;   ULNAR NERVE TRANSPOSITION Right    UPPER ESOPHAGEAL ENDOSCOPIC ULTRASOUND (EUS) N/A 10/19/2020   Procedure: UPPER ESOPHAGEAL ENDOSCOPIC ULTRASOUND (EUS);  Surgeon: [name], MD;  Location: [facility];  Service: Endoscopy;  Laterality: N/A;  periduodenal lesion    SOCIAL HISTORY: Social History   Socioeconomic History   Marital status: Married    Spouse name: Not on file   Number  of children: 2   Years of education: Not on file   Highest education level: 12th grade  Occupational History   Occupation: disabled   Occupation: disabled  Tobacco Use   Smoking status: Every Day    Current packs/day: 2.00    Average packs/day: 2.0 packs/day for 46.0 years (92.0 ttl pk-yrs)    Types: Cigarettes   Smokeless tobacco: Never   Tobacco comments:    2 packs of cigarettes smoked daily 12/13/21- declines smoking cessation  Vaping Use   Vaping status: Never Used  Substance and Sexual Activity   Alcohol use: No   Drug use: No   Sexual activity: Not Currently    Partners: Male  Other Topics Concern   Not on file  Social History Narrative   Right handed    Caffeine~ 2 cups per day    Lives at home with husband (strained relationship)   Primary caretaker for disabled brother who had aneurism   Daughter died 06-09-18   Social Drivers of Corporate Investment Banker Strain: Low Risk  (07/31/2023)   Received [facility]   Overall Financial Resource Strain (CARDIA)    How hard is it for you to pay for the very basics like food, housing, medical care,  and heating?: Not very hard  Food Insecurity: No Food Insecurity (07/31/2023)   Received [facility]   Hunger Vital Sign    Within the past 12 months, you worried that your food would run out before you got the money to buy more.: Never true    Within the past 12 months, the food you bought just didn't last and you didn't have money to get more.: Never true  Recent Concern: Food Insecurity - Food Insecurity Present (05/20/2023)   Hunger Vital Sign    Worried About Running Out of Food in the Last Year: Sometimes true    Ran Out of Food in the Last Year: Sometimes true  Transportation Needs: No Transportation Needs (07/31/2023)   Received [facility]   PRAPARE - Transportation    Lack of Transportation (Medical): No    Lack of Transportation (Non-Medical): No  Physical Activity: Inactive (05/20/2023)    Exercise Vital Sign    Days of Exercise per Week: 0 days    Minutes of Exercise per Session: 0 min  Stress: No Stress Concern Present (05/20/2023)   [employer] of Occupational Health - Occupational Stress Questionnaire    Feeling of Stress : Only a little  Recent Concern: Stress - Stress Concern Present (03/31/2023)   [employer] of Occupational Health - Occupational Stress Questionnaire    Feeling of Stress : Very much  Social Connections: Moderately Integrated (05/20/2023)   Social Connection and Isolation Panel    Frequency of Communication with Friends and Family: More than three times a week    Frequency of Social Gatherings with Friends and Family: More than three times a week    Attends Religious Services: 1 to 4 times per year    Active Member of [employer] or Organizations: No    Attends Banker Meetings: Never    Marital Status: Married  Catering Manager Violence: Not At Risk (07/31/2023)   Received from [facility]   Humiliation, Afraid, Rape, and Kick questionnaire    Within the last year, have you been afraid of your partner or ex-partner?: No    Within the last year, have you been humiliated or emotionally abused in other ways by your partner or ex-partner?: No    Within the last year, have you been kicked, hit, slapped, or otherwise physically hurt by your partner or ex-partner?: No    Within the last year, have you been raped or forced to have any kind of sexual activity by your partner or ex-partner?: No    FAMILY HISTORY: Family History  Problem Relation Age of Onset   COPD Mother    Heart disease Mother    Lung disease Father        Asbestosis   Heart attack Father    Heart disease Father    Cerebral aneurysm Brother    Aneurysm Brother        Brain   Drug abuse Daughter    Epilepsy Son    Alcohol abuse Son    Drug abuse Son    Arthritis Maternal Grandmother    Heart disease Maternal Grandmother    Asthma Maternal Grandfather     Cancer Maternal Grandfather    Arthritis Paternal Grandmother    Heart disease Paternal Grandmother    Stroke Paternal Grandmother    Early death Paternal Grandfather    Heart disease Paternal Grandfather     ALLERGIES:  is allergic to nisoldipine, metformin  and related, nsaids, pregabalin, wellbutrin  [  bupropion ], aleve [naproxen sodium], penicillins, and sulfonamide derivatives.  MEDICATIONS:  Current Outpatient Medications  Medication Sig Dispense Refill   prochlorperazine  (COMPAZINE ) 10 MG tablet Take 1 tablet (10 mg total) by mouth every 6 (six) hours as needed for nausea or vomiting. 30 tablet 0   albuterol  (PROAIR  HFA) 108 (90 Base) MCG/ACT inhaler 2 puffs every 4 hours as needed only  if your can't catch your breath 18 g 5   ALPRAZolam  (XANAX ) 1 MG tablet TAKE 1 TABLET BY MOUTH THREE TIMES DAILY AS NEEDED FOR ANXIETY 90 tablet 2   ARIPiprazole  (ABILIFY ) 5 MG tablet Take 1 tablet (5 mg total) by mouth daily. 30 tablet 5   atorvastatin  (LIPITOR) 20 MG tablet TAKE 1 TABLET BY MOUTH AT BEDTIME 90 tablet 1   benzonatate  (TESSALON ) 100 MG capsule Take 1 capsule (100 mg total) by mouth 3 (three) times daily. 90 capsule 1   calcipotriene  (DOVONOX) 0.005 % cream Apply to affected area bid when on break from steroid cream 120 g 3   cyclobenzaprine  (FLEXERIL ) 10 MG tablet Take 10 mg by mouth 3 (three) times daily as needed.     dicyclomine  (BENTYL ) 10 MG capsule TAKE 1 CAPSULE BY MOUTH EVERY 8 HOURS AS NEEDED FOR SPASMS 60 capsule 1   diphenoxylate -atropine  (LOMOTIL ) 2.5-0.025 MG tablet TAKE 1 TABLET BY MOUTH FOUR TIMES DAILY AS NEEDED FOR diarrhea OR loose stools 60 tablet 0   DULoxetine  (CYMBALTA ) 60 MG capsule TAKE 2 CAPSULES BY MOUTH EVERY DAY 180 capsule 1   ferrous sulfate  325 (65 FE) MG EC tablet TAKE 1 TABLET BY MOUTH DAILY WITH BREAKFAST 30 tablet 3   fluconazole  (DIFLUCAN ) 150 MG tablet Take 150 mg by mouth once.     fluticasone  (FLONASE ) 50 MCG/ACT nasal spray INSTILL 2 SPRAYS IN  EACH NOSTRIL EVERY DAY 16 g 6   furosemide  (LASIX ) 20 MG tablet TAKE 1 TABLET BY MOUTH TWICE DAILY, IF SWELLING IMPROVES CAN HOLD MEDICATION 90 tablet 0   HYDROcodone -acetaminophen  (NORCO) 10-325 MG tablet Take 1 tablet by mouth every 4 (four) hours as needed. 180 tablet 0   HYDROmorphone  (DILAUDID ) 2 MG tablet Take 1 tablet (2 mg total) by mouth every 6 (six) hours as needed for severe pain (pain score 7-10) (in cancer pain). 120 tablet 0   hydrOXYzine  (ATARAX ) 10 MG tablet Take 1 tablet (10 mg total) by mouth every 6 (six) hours as needed. 30 tablet 2   levothyroxine  (SYNTHROID ) 137 MCG tablet Take 1 tablet (137 mcg total) by mouth daily before breakfast. 90 tablet 3   lidocaine -prilocaine  (EMLA ) cream Apply 1 Application topically as needed. 30 g 2   loratadine  (CLARITIN ) 10 MG tablet Take 10 mg by mouth daily.     meclizine  (ANTIVERT ) 12.5 MG tablet Take 1 tablet (12.5 mg total) by mouth 2 (two) times daily. 60 tablet 2   meloxicam  (MOBIC ) 15 MG tablet TAKE 1 TABLET BY MOUTH EVERY DAY 30 tablet 2   NARCAN  4 MG/0.1ML LIQD nasal spray kit SMARTSIG:Both Nares     nystatin  (MYCOSTATIN ) 100000 UNIT/ML suspension      OLANZapine  (ZYPREXA ) 10 MG tablet Take 1 tablet (10 mg total) by mouth at bedtime. 30 tablet 2   omeprazole  (PRILOSEC) 40 MG capsule Take 1 capsule (40 mg total) by mouth 2 (two) times daily. 180 capsule 3   ondansetron  (ZOFRAN ) 8 MG tablet TAKE 1 TABLET BY MOUTH TWICE DAILY 60 tablet 1   permethrin  (ELIMITE ) 5 % cream  potassium chloride  SA (KLOR-CON  M) 20 MEQ tablet TAKE 1 TABLET BY MOUTH TWICE DAILY 60 tablet 2   Rimegepant Sulfate (NURTEC) 75 MG TBDP TAKE 1 TABLET BY MOUTH DAILY AS NEEDED FOR MIGRAINE, take WITHIN 15 MINUTES of initial symptoms 8 tablet 1   topiramate  (TOPAMAX ) 100 MG tablet Take 1 tablet (100 mg total) by mouth at bedtime. 90 tablet 1   triamcinolone  ointment (KENALOG ) 0.5 % Apply 1 Application topically 3 (three) times daily. Apply thin-layer on affected  areas, no more than 2 weeks consistently. 60 g 2   triamterene -hydrochlorothiazide  (MAXZIDE -25) 37.5-25 MG tablet Take 1 tablet by mouth daily.     umeclidinium-vilanterol (ANORO ELLIPTA ) 62.5-25 MCG/ACT AEPB Inhale 1 puff into the lungs daily. 60 each 11   No current facility-administered medications for this visit.   Facility-Administered Medications Ordered in Other Visits  Medication Dose Route Frequency Provider Last Rate Last Admin   atezolizumab  (TECENTRIQ ) 1,200 mg in sodium chloride  0.9 % 250 mL chemo infusion  1,200 mg Intravenous Once Dorsey, John T IV, MD        REVIEW OF SYSTEMS:   Constitutional: ( - ) fevers, ( - )  chills , ( - ) night sweats Eyes: ( - ) blurriness of vision, ( - ) double vision, ( - ) watery eyes Ears, nose, mouth, throat, and face: ( - ) mucositis, ( - ) sore throat Respiratory: ( - ) cough, ( -) dyspnea, ( - ) wheezes Cardiovascular: ( - ) palpitation, ( - ) chest discomfort, ( + ) lower extremity swelling Gastrointestinal:  ( -) nausea, ( - ) heartburn, ( - ) change in bowel habits Skin: ( - ) abnormal skin rashes Lymphatics: ( - ) new lymphadenopathy, ( - ) easy bruising Neurological: (+ ) numbness, ( - ) tingling, ( - ) new weaknesses Behavioral/Psych: ( - ) mood change, ( - ) new changes  All other systems were reviewed with the patient and are negative.  PHYSICAL EXAMINATION: ECOG PERFORMANCE STATUS: 1 - Symptomatic but completely ambulatory  Vitals:   12/03/23 1042 12/03/23 1046  BP: (!) 152/87 119/75  Pulse: 93   Resp: 16   Temp: (!) 97.5 F (36.4 C)   SpO2: 96%        Filed Weights   12/03/23 1042  Weight: 182 lb (82.6 kg)    GENERAL: Well-appearing middle-age Caucasian female, alert, no distress and comfortable SKIN: skin color, texture, turgor are normal, no rashes or significant lesions EYES: conjunctiva are pink and non-injected, sclera clear LUNGS:  normal breathing effort. Diffuse wheezing hear on ausculation.  HEART:  regular rate & rhythm and no murmurs.  Musculoskeletal: no cyanosis of digits and no clubbing  PSYCH: alert & oriented x 3, fluent speech NEURO: no focal motor/sensory deficits  LABORATORY DATA:  I have reviewed the data as listed    Latest Ref Rng & Units 12/03/2023   10:15 AM 11/11/2023   10:15 AM 10/21/2023   10:16 AM  CBC  WBC 4.0 - 10.5 K/uL 7.3  7.1  8.0   Hemoglobin 12.0 - 15.0 g/dL 85.8  85.7  85.0   Hematocrit 36.0 - 46.0 % 42.6  43.1  44.6   Platelets 150 - 400 K/uL 151  152  169        Latest Ref Rng & Units 12/03/2023   10:15 AM 11/11/2023   10:15 AM 10/21/2023   10:16 AM  CMP  Glucose 70 - 99 mg/dL 95  93  105   BUN 8 - 23 mg/dL 13  15  11    Creatinine 0.44 - 1.00 mg/dL 8.91  8.83  9.02   Sodium 135 - 145 mmol/L 140  141  137   Potassium 3.5 - 5.1 mmol/L 3.6  4.0  3.8   Chloride 98 - 111 mmol/L 107  108  105   CO2 22 - 32 mmol/L 28  28  27    Calcium  8.9 - 10.3 mg/dL 8.3  8.8  8.7   Total Protein 6.5 - 8.1 g/dL 5.9  5.9  6.4   Total Bilirubin 0.0 - 1.2 mg/dL 0.3  0.4  0.4   Alkaline Phos 38 - 126 U/L 72  78  81   AST 15 - 41 U/L 14  11  18    ALT 0 - 44 U/L 8  8  10      No results found for: MPROTEIN Lab Results  Component Value Date   KPAFRELGTCHN 0.75 06/30/2014   LAMBDASER 3.78 (H) 06/30/2014   KAPLAMBRATIO 0.20 (L) 06/30/2014     RADIOGRAPHIC STUDIES: No results found.     ASSESSMENT & PLAN KAITLYN FRANKO is a 68 y.o. female with medical history significant for extensive stage small cell lung cancer who presents for a follow up visit.      # Small Cell Lung Cancer, Extensive Stage -- MRI of the brain shows no evidence of intracranial spread --Findings are currently consistent with metastatic small cell lung cancer with metastatic spread to the lymph nodes of the abdomen -- Started carboplatin , etoposide , and atezolizumab  on 11/13/2020. Transitioned to maintenance Atezolizumab  on 02/22/2021.  Plan: --today is Cycle 47 Day 1 of Atezolizumab   maintenance.  --labs today were reviewed and adequate for treatment.  WBC 7.3, hemoglobin 14.1, platelet 151, creatinine overall stable at 1.08, LFTs in range. -- Most recent CT CAP from 10/15/2023 which shows no evidence of recurrence. Repeat scan due in December 2025.  -- No prohibitive toxicities identified.  Proceed with treatment without any dose modifications.  --RTC in 3 weeks for a follow up before Cycle 48  # Early Cirrhosis --noted on MR abdomen from 05/22/2022. US  with elastography performed on 06/18/2022. --Most recent CT scan from 10/15/2023 showed slight enlargement of lateral left lobe of liver with questionable contour nodularity. Dr. Leigh reports repeat RUQ US  in 6 months (March 2026).  #Right renal lesion: --Noted to be benign on most recent MRI of the abdomen.  #Diarrhea -- Previously sent stool studies for C. Diff and GI pathology panel, all negative --patient following with GI and found to have pancreatic insufficiency and started on creon .  #Nausea: --Symptoms improved with zofran  and olanzapine .  --Added IV zofran  to infusion premeds for better symptom control.  --Will sent compazine  for breakthrough nausea.   #Hypokalemia:  --currently taking potassium chloride  20 meq BID.   --potassium level is 3.6 today. Continue PO supplementation.   #Lower extremity edema --Currently takes lasix  20 mg once daily day. --Advised to  wear compression stockings.   #Neuropathy involving fingers/feet: --Patient currently takes Cymbalta    #Supportive Care -- chemotherapy education complete -- port placed -- zofran  8mg  q8H PRN and compazine  10mg  PO q6H for nausea -- EMLA  cream for port  No orders of the defined types were placed in this encounter.  All questions were answered. The patient knows to call the clinic with any problems, questions or concerns.  I have spent a total of 30 minutes minutes of face-to-face and non-face-to-face time, preparing  to see the patient,  performing a medically appropriate examination, counseling and educating the patient, ordering medications/tests, documenting clinical information in the electronic health record, and care coordination.   Johnston Police PA-C Dept of Hematology and Oncology [facility] Phone: [phone]   12/03/2023 12:19 PM

## 2023-12-03 ENCOUNTER — Inpatient Hospital Stay: Attending: Hematology and Oncology | Admitting: Dietician

## 2023-12-03 ENCOUNTER — Inpatient Hospital Stay: Attending: Hematology and Oncology

## 2023-12-03 ENCOUNTER — Inpatient Hospital Stay: Attending: Hematology and Oncology | Admitting: Physician Assistant

## 2023-12-03 ENCOUNTER — Inpatient Hospital Stay

## 2023-12-03 VITALS — BP 119/75 | HR 93 | Temp 97.5°F | Resp 16 | Ht 62.0 in | Wt 182.0 lb

## 2023-12-03 DIAGNOSIS — E1129 Type 2 diabetes mellitus with other diabetic kidney complication: Secondary | ICD-10-CM | POA: Diagnosis not present

## 2023-12-03 DIAGNOSIS — R197 Diarrhea, unspecified: Secondary | ICD-10-CM | POA: Diagnosis not present

## 2023-12-03 DIAGNOSIS — Z723 Lack of physical exercise: Secondary | ICD-10-CM | POA: Insufficient documentation

## 2023-12-03 DIAGNOSIS — Z634 Disappearance and death of family member: Secondary | ICD-10-CM | POA: Insufficient documentation

## 2023-12-03 DIAGNOSIS — K8689 Other specified diseases of pancreas: Secondary | ICD-10-CM | POA: Diagnosis not present

## 2023-12-03 DIAGNOSIS — Z90721 Acquired absence of ovaries, unilateral: Secondary | ICD-10-CM | POA: Insufficient documentation

## 2023-12-03 DIAGNOSIS — F419 Anxiety disorder, unspecified: Secondary | ICD-10-CM | POA: Diagnosis not present

## 2023-12-03 DIAGNOSIS — Z9841 Cataract extraction status, right eye: Secondary | ICD-10-CM | POA: Insufficient documentation

## 2023-12-03 DIAGNOSIS — Z8719 Personal history of other diseases of the digestive system: Secondary | ICD-10-CM | POA: Insufficient documentation

## 2023-12-03 DIAGNOSIS — C3431 Malignant neoplasm of lower lobe, right bronchus or lung: Secondary | ICD-10-CM

## 2023-12-03 DIAGNOSIS — E876 Hypokalemia: Secondary | ICD-10-CM | POA: Diagnosis not present

## 2023-12-03 DIAGNOSIS — Z880 Allergy status to penicillin: Secondary | ICD-10-CM | POA: Insufficient documentation

## 2023-12-03 DIAGNOSIS — Z8261 Family history of arthritis: Secondary | ICD-10-CM | POA: Insufficient documentation

## 2023-12-03 DIAGNOSIS — Z90710 Acquired absence of both cervix and uterus: Secondary | ICD-10-CM | POA: Insufficient documentation

## 2023-12-03 DIAGNOSIS — N289 Disorder of kidney and ureter, unspecified: Secondary | ICD-10-CM | POA: Insufficient documentation

## 2023-12-03 DIAGNOSIS — G8929 Other chronic pain: Secondary | ICD-10-CM | POA: Insufficient documentation

## 2023-12-03 DIAGNOSIS — F17210 Nicotine dependence, cigarettes, uncomplicated: Secondary | ICD-10-CM | POA: Diagnosis not present

## 2023-12-03 DIAGNOSIS — J449 Chronic obstructive pulmonary disease, unspecified: Secondary | ICD-10-CM | POA: Diagnosis not present

## 2023-12-03 DIAGNOSIS — R600 Localized edema: Secondary | ICD-10-CM | POA: Diagnosis not present

## 2023-12-03 DIAGNOSIS — Z820 Family history of epilepsy and other diseases of the nervous system: Secondary | ICD-10-CM | POA: Insufficient documentation

## 2023-12-03 DIAGNOSIS — K7460 Unspecified cirrhosis of liver: Secondary | ICD-10-CM | POA: Insufficient documentation

## 2023-12-03 DIAGNOSIS — Z79890 Hormone replacement therapy (postmenopausal): Secondary | ICD-10-CM | POA: Insufficient documentation

## 2023-12-03 DIAGNOSIS — Z809 Family history of malignant neoplasm, unspecified: Secondary | ICD-10-CM | POA: Insufficient documentation

## 2023-12-03 DIAGNOSIS — Z8249 Family history of ischemic heart disease and other diseases of the circulatory system: Secondary | ICD-10-CM | POA: Insufficient documentation

## 2023-12-03 DIAGNOSIS — Z5112 Encounter for antineoplastic immunotherapy: Secondary | ICD-10-CM

## 2023-12-03 DIAGNOSIS — E039 Hypothyroidism, unspecified: Secondary | ICD-10-CM | POA: Diagnosis not present

## 2023-12-03 DIAGNOSIS — R531 Weakness: Secondary | ICD-10-CM | POA: Insufficient documentation

## 2023-12-03 DIAGNOSIS — Z9049 Acquired absence of other specified parts of digestive tract: Secondary | ICD-10-CM | POA: Insufficient documentation

## 2023-12-03 DIAGNOSIS — Z79899 Other long term (current) drug therapy: Secondary | ICD-10-CM | POA: Diagnosis not present

## 2023-12-03 DIAGNOSIS — E785 Hyperlipidemia, unspecified: Secondary | ICD-10-CM | POA: Insufficient documentation

## 2023-12-03 DIAGNOSIS — Z882 Allergy status to sulfonamides status: Secondary | ICD-10-CM | POA: Insufficient documentation

## 2023-12-03 DIAGNOSIS — Z811 Family history of alcohol abuse and dependence: Secondary | ICD-10-CM | POA: Insufficient documentation

## 2023-12-03 DIAGNOSIS — F32A Depression, unspecified: Secondary | ICD-10-CM | POA: Diagnosis not present

## 2023-12-03 DIAGNOSIS — I10 Essential (primary) hypertension: Secondary | ICD-10-CM | POA: Insufficient documentation

## 2023-12-03 DIAGNOSIS — Z825 Family history of asthma and other chronic lower respiratory diseases: Secondary | ICD-10-CM | POA: Insufficient documentation

## 2023-12-03 DIAGNOSIS — Z96641 Presence of right artificial hip joint: Secondary | ICD-10-CM | POA: Insufficient documentation

## 2023-12-03 DIAGNOSIS — Z9842 Cataract extraction status, left eye: Secondary | ICD-10-CM | POA: Insufficient documentation

## 2023-12-03 DIAGNOSIS — Z823 Family history of stroke: Secondary | ICD-10-CM | POA: Insufficient documentation

## 2023-12-03 DIAGNOSIS — Z886 Allergy status to analgesic agent status: Secondary | ICD-10-CM | POA: Insufficient documentation

## 2023-12-03 DIAGNOSIS — G629 Polyneuropathy, unspecified: Secondary | ICD-10-CM | POA: Diagnosis not present

## 2023-12-03 DIAGNOSIS — Z888 Allergy status to other drugs, medicaments and biological substances status: Secondary | ICD-10-CM | POA: Insufficient documentation

## 2023-12-03 LAB — CBC WITH DIFFERENTIAL (CANCER CENTER ONLY)
Abs Immature Granulocytes: 0.02 K/uL (ref 0.00–0.07)
Basophils Absolute: 0.1 K/uL (ref 0.0–0.1)
Basophils Relative: 1 %
Eosinophils Absolute: 0.7 K/uL — ABNORMAL HIGH (ref 0.0–0.5)
Eosinophils Relative: 10 %
HCT: 42.6 % (ref 36.0–46.0)
Hemoglobin: 14.1 g/dL (ref 12.0–15.0)
Immature Granulocytes: 0 %
Lymphocytes Relative: 28 %
Lymphs Abs: 2 K/uL (ref 0.7–4.0)
MCH: 29.7 pg (ref 26.0–34.0)
MCHC: 33.1 g/dL (ref 30.0–36.0)
MCV: 89.7 fL (ref 80.0–100.0)
Monocytes Absolute: 0.6 K/uL (ref 0.1–1.0)
Monocytes Relative: 8 %
Neutro Abs: 3.9 K/uL (ref 1.7–7.7)
Neutrophils Relative %: 53 %
Platelet Count: 151 K/uL (ref 150–400)
RBC: 4.75 MIL/uL (ref 3.87–5.11)
RDW: 15.5 % (ref 11.5–15.5)
WBC Count: 7.3 K/uL (ref 4.0–10.5)
nRBC: 0 % (ref 0.0–0.2)

## 2023-12-03 LAB — CMP (CANCER CENTER ONLY)
ALT: 8 U/L (ref 0–44)
AST: 14 U/L — ABNORMAL LOW (ref 15–41)
Albumin: 3.3 g/dL — ABNORMAL LOW (ref 3.5–5.0)
Alkaline Phosphatase: 72 U/L (ref 38–126)
Anion gap: 5 (ref 5–15)
BUN: 13 mg/dL (ref 8–23)
CO2: 28 mmol/L (ref 22–32)
Calcium: 8.3 mg/dL — ABNORMAL LOW (ref 8.9–10.3)
Chloride: 107 mmol/L (ref 98–111)
Creatinine: 1.08 mg/dL — ABNORMAL HIGH (ref 0.44–1.00)
GFR, Estimated: 56 mL/min — ABNORMAL LOW (ref >60)
Glucose, Bld: 95 mg/dL (ref 70–99)
Potassium: 3.6 mmol/L (ref 3.5–5.1)
Sodium: 140 mmol/L (ref 135–145)
Total Bilirubin: 0.3 mg/dL (ref 0.0–1.2)
Total Protein: 5.9 g/dL — ABNORMAL LOW (ref 6.5–8.1)

## 2023-12-03 LAB — TSH: TSH: 0.924 u[IU]/mL (ref 0.350–4.500)

## 2023-12-03 MED ORDER — SODIUM CHLORIDE 0.9 % IV SOLN: Freq: Once | INTRAVENOUS | Status: AC

## 2023-12-03 MED ORDER — SODIUM CHLORIDE 0.9 % IV SOLN
1200.0000 mg | Freq: Once | INTRAVENOUS | Status: AC
  Administered 2023-12-03: 1200 mg via INTRAVENOUS
  Filled 2023-12-03: qty 20

## 2023-12-03 MED ORDER — PROCHLORPERAZINE MALEATE 10 MG PO TABS: 10.0000 mg | ORAL_TABLET | Freq: Four times a day (QID) | ORAL | 0 refills | Status: DC | PRN

## 2023-12-03 MED ORDER — ONDANSETRON HCL 4 MG/2ML IJ SOLN
8.0000 mg | Freq: Once | INTRAMUSCULAR | Status: AC
  Administered 2023-12-03: 8 mg via INTRAVENOUS
  Filled 2023-12-03: qty 4

## 2023-12-03 NOTE — Progress Notes (Signed)
 Nutrition Follow-up:  Patient with extensive stage small cell lung cancer. She is receiving maintenance therapy with atezolizumab  q21d.   Met with patient in infusion. Drinking cup of coffee as usual. She is doing okay today. Appetite is the same. Has good and bad days. 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 She feels this is what keeps her going.    Medications: abilify , bentyl , norco, dilaudid , atarax , antivert , zofran , compazine , klor-con , topamax  (new since last f/u)  Labs: Cr 1.08, albumin  3.3  Anthropometrics: Wt 182 lb today  10/14 - 178 lb 8 oz 9/23 - 179 lb 9.6 oz  8/11 - 186 lb 4.8 oz    NUTRITION DIAGNOSIS: Inadequate oral intake - continues     INTERVENTION:  Continue to encourage small frequent meals/snacks with good sources of protein  Support and encouragement     MONITORING, EVALUATION, GOAL: wt trends, intake   NEXT VISIT: Wednesday November 26 during infusion

## 2023-12-03 NOTE — Patient Instructions (Signed)
 [facility] - A DEPT OF [facility]  Discharge Instructions: Thank you for choosing [facility] to provide your oncology and hematology care.   If you have a lab appointment with the Cancer Center, please go directly to the Cancer Center and check in at the registration area.   Wear comfortable clothing and clothing appropriate for easy access to any Portacath or PICC line.   We strive to give you quality time with your provider. You may need to reschedule your appointment if you arrive late (15 or more minutes).  Arriving late affects you and other patients whose appointments are after yours.  Also, if you miss three or more appointments without notifying the office, you may be dismissed from the clinic at the provider's discretion.      For prescription refill requests, have your pharmacy contact our office and allow 72 hours for refills to be completed.    Today you received the following chemotherapy and/or immunotherapy agents: atezolizumab [facility])     To help prevent nausea and vomiting after your treatment, we encourage you to take your nausea medication as directed.  BELOW ARE SYMPTOMS THAT SHOULD BE REPORTED IMMEDIATELY: *FEVER GREATER THAN 100.4 F (38 C) OR HIGHER *CHILLS OR SWEATING *NAUSEA AND VOMITING THAT IS NOT CONTROLLED WITH YOUR NAUSEA MEDICATION *UNUSUAL SHORTNESS OF BREATH *UNUSUAL BRUISING OR BLEEDING *URINARY PROBLEMS (pain or burning when urinating, or frequent urination) *BOWEL PROBLEMS (unusual diarrhea, constipation, pain near the anus) TENDERNESS IN MOUTH AND THROAT WITH OR WITHOUT PRESENCE OF ULCERS (sore throat, sores in mouth, or a toothache) UNUSUAL RASH, SWELLING OR PAIN  UNUSUAL VAGINAL DISCHARGE OR ITCHING   Items with * indicate a potential emergency and should be followed up as soon as possible or go to the Emergency Department if any problems should occur.  Please show the CHEMOTHERAPY ALERT CARD or  IMMUNOTHERAPY ALERT CARD at check-in to the Emergency Department and triage nurse.  Should you have questions after your visit or need to cancel or reschedule your appointment, please contact [facility] - A DEPT OF [facility]  Dept: [phone]  and follow the prompts.  Office hours are 8:00 a.m. to 4:30 p.m. Monday - Friday. Please note that voicemails left after 4:00 p.m. may not be returned until the following business day.  We are closed weekends and major holidays. You have access to a nurse at all times for urgent questions. Please call the main number to the clinic Dept: [phone] and follow the prompts.   For any non-urgent questions, you may also contact your provider using MyChart. We now offer e-Visits for anyone 55 and older to request care online for non-urgent symptoms. For details visit [email or web address].   Also download the MyChart app! Go to the app store, search "MyChart", open the app, select [place], and log in with your MyChart username and password.

## 2023-12-04 LAB — T4: T4, Total: 8.4 ug/dL (ref 4.5–12.0)

## 2023-12-05 ENCOUNTER — Telehealth: Payer: Self-pay | Admitting: *Deleted

## 2023-12-05 MED ORDER — HYDROMORPHONE HCL 4 MG PO TABS: 4.0000 mg | ORAL_TABLET | Freq: Four times a day (QID) | ORAL | 0 refills | Status: DC | PRN

## 2023-12-05 NOTE — Telephone Encounter (Signed)
 [name] called and says that the medication change that was made is not helping and she is wondering if an adjustment can be made to get her through the month until she can go back to the old medication.

## 2023-12-08 ENCOUNTER — Encounter: Admitting: Physical Medicine and Rehabilitation

## 2023-12-16 ENCOUNTER — Ambulatory Visit (HOSPITAL_BASED_OUTPATIENT_CLINIC_OR_DEPARTMENT_OTHER): Admitting: Pulmonary Disease

## 2023-12-24 ENCOUNTER — Inpatient Hospital Stay

## 2023-12-24 ENCOUNTER — Inpatient Hospital Stay: Admitting: Dietician

## 2023-12-24 ENCOUNTER — Inpatient Hospital Stay: Admitting: Hematology and Oncology

## 2023-12-24 VITALS — BP 132/74 | HR 81 | Temp 97.8°F | Resp 15 | Wt 189.3 lb

## 2023-12-24 DIAGNOSIS — C3431 Malignant neoplasm of lower lobe, right bronchus or lung: Secondary | ICD-10-CM

## 2023-12-24 DIAGNOSIS — Z5112 Encounter for antineoplastic immunotherapy: Secondary | ICD-10-CM | POA: Diagnosis not present

## 2023-12-24 DIAGNOSIS — R300 Dysuria: Secondary | ICD-10-CM

## 2023-12-24 LAB — CBC WITH DIFFERENTIAL (CANCER CENTER ONLY)
Abs Immature Granulocytes: 0.03 K/uL (ref 0.00–0.07)
Basophils Absolute: 0 K/uL (ref 0.0–0.1)
Basophils Relative: 1 %
Eosinophils Absolute: 0.5 K/uL (ref 0.0–0.5)
Eosinophils Relative: 6 %
HCT: 41 % (ref 36.0–46.0)
Hemoglobin: 13.5 g/dL (ref 12.0–15.0)
Immature Granulocytes: 0 %
Lymphocytes Relative: 29 %
Lymphs Abs: 2.3 K/uL (ref 0.7–4.0)
MCH: 30.1 pg (ref 26.0–34.0)
MCHC: 32.9 g/dL (ref 30.0–36.0)
MCV: 91.5 fL (ref 80.0–100.0)
Monocytes Absolute: 0.7 K/uL (ref 0.1–1.0)
Monocytes Relative: 9 %
Neutro Abs: 4.5 K/uL (ref 1.7–7.7)
Neutrophils Relative %: 55 %
Platelet Count: 147 K/uL — ABNORMAL LOW (ref 150–400)
RBC: 4.48 MIL/uL (ref 3.87–5.11)
RDW: 15.9 % — ABNORMAL HIGH (ref 11.5–15.5)
WBC Count: 8 K/uL (ref 4.0–10.5)
nRBC: 0 % (ref 0.0–0.2)

## 2023-12-24 LAB — URINALYSIS, COMPLETE (UACMP) WITH MICROSCOPIC
Bilirubin Urine: NEGATIVE
Glucose, UA: NEGATIVE mg/dL
Hgb urine dipstick: NEGATIVE
Ketones, ur: NEGATIVE mg/dL
Leukocytes,Ua: NEGATIVE
Nitrite: NEGATIVE
Protein, ur: NEGATIVE mg/dL
Specific Gravity, Urine: 1.008 (ref 1.005–1.030)
pH: 6 (ref 5.0–8.0)

## 2023-12-24 LAB — CMP (CANCER CENTER ONLY)
ALT: 13 U/L (ref 0–44)
AST: 19 U/L (ref 15–41)
Albumin: 3.8 g/dL (ref 3.5–5.0)
Alkaline Phosphatase: 87 U/L (ref 38–126)
Anion gap: 8 (ref 5–15)
BUN: 14 mg/dL (ref 8–23)
CO2: 29 mmol/L (ref 22–32)
Calcium: 8.3 mg/dL — ABNORMAL LOW (ref 8.9–10.3)
Chloride: 103 mmol/L (ref 98–111)
Creatinine: 1.18 mg/dL — ABNORMAL HIGH (ref 0.44–1.00)
GFR, Estimated: 50 mL/min — ABNORMAL LOW (ref >60)
Glucose, Bld: 87 mg/dL (ref 70–99)
Potassium: 4 mmol/L (ref 3.5–5.1)
Sodium: 140 mmol/L (ref 135–145)
Total Bilirubin: 0.3 mg/dL (ref 0.0–1.2)
Total Protein: 6.1 g/dL — ABNORMAL LOW (ref 6.5–8.1)

## 2023-12-24 LAB — TSH: TSH: 1.76 u[IU]/mL (ref 0.350–4.500)

## 2023-12-24 MED ORDER — SODIUM CHLORIDE 0.9 % IV SOLN: Freq: Once | INTRAVENOUS | Status: AC

## 2023-12-24 MED ORDER — SODIUM CHLORIDE 0.9 % IV SOLN
1200.0000 mg | Freq: Once | INTRAVENOUS | Status: AC
  Administered 2023-12-24: 1200 mg via INTRAVENOUS
  Filled 2023-12-24: qty 20

## 2023-12-24 MED ORDER — ONDANSETRON HCL 4 MG/2ML IJ SOLN
8.0000 mg | Freq: Once | INTRAMUSCULAR | Status: AC
  Administered 2023-12-24: 8 mg via INTRAVENOUS
  Filled 2023-12-24: qty 4

## 2023-12-24 NOTE — Progress Notes (Signed)
 [facility] [phone]   [phone]  PROGRESS NOTE  Patient Care Team: [name], FNP as PCP - General (Family Medicine) O'Neal, Darryle Ned, MD as PCP - Cardiology (Cardiology) [name], MD (Inactive) as Attending Physician (Gastroenterology) [name], MD as Consulting Physician (Orthopedic Surgery) [name], MD as Consulting Physician (Orthopedic Surgery) [name], MD (Inactive) as Consulting Physician (Neurology) [name], MD as Consulting Physician (Neurology) [name], [facility] (Inactive) (Pharmacist) [facility]ll[facility], [facility]   Hematological/Oncological History # Small Cell Lung Cancer, Extensive Stage 07/05/2020: CT abdomen for lower abdominal pain. New pulmonary nodular density noted 07/06/2020: CT chest showed 2.2 cm macrolobulated right lower lobe pulmonary nodule (favored) versus pathologically enlarged infrahilar lymph node 10/13/2020: PET CT scan performed, findings show 2 cm right lower lobe lung mass is hypermetabolic and consistent with primary lung neoplasm. Additionally found hypermetabolic 17 mm soft tissue lesion between the descending duodenum and the pancreatic head  10/19/2020: EGD to biopsy hypermetabolic lymph node. Biopsy results show small cell lung cancer 10/26/2020: establish care with Dr. Federico  11/13/2020: Cycle 1 Day 1 of Carbo/Etop/Atezolizumab  12/04/2020: Cycle 2 Day 1 of Carbo/Etop/Atezolizumab  11/22-11/25/2022: admitted for E. Coli bacteremia/sepsis. Start of Cycle 3 delayed. 01/02/2021: Cycle 3 Day 1 of Carbo/Etop/Atezolizumab  01/23/2021: Cycle 4 Day 1 of Carbo/Etop/Atezolizumab  02/22/2021: Cycle 5 Day 1 of Atezolizumab  Maintenance. Delayed start due to patient's COVID infection.  03/14/2021: Cycle 6 Day 1 of Atezolizumab  Maintenance 04/05/2021: Cycle 7 Day 1 of Atezolizumab  Maintenance 04/25/2021: Cycle 8 Day 1 of Atezolizumab   Maintenance 05/16/2021: Cycle 9 Day 1 of Atezolizumab  Maintenance 06/06/2021: Cycle 10 Day 1 of Atezolizumab  Maintenance 06/27/2021:  Cycle 11 Day 1 of Atezolizumab  Maintenance 07/18/2021: Cycle 12 Day 1 of Atezolizumab  Maintenance 08/08/2021: Cycle 13 Day 1 of Atezolizumab  Maintenance 08/29/2021: Cycle 14 Day 1 of Atezolizumab  Maintenance 09/19/2021: Cycle 15 Day 1 of Atezolizumab  Maintenance 10/10/2021: Cycle 16 Day 1 of Atezolizumab  Maintenance 10/31/2021: Cycle 17 Day 1 of Atezolizumab  Maintenance 11/21/2021: Cycle 18 Day 1 of Atezolizumab  Maintenance 12/12/2021: Cycle 19 Day 1 of Atezolizumab  Maintenance 01/02/2022: Cycle 20 Day 1 of Atezolizumab  Maintenance 01/23/2022: Cycle 21 Day 1 of Atezolizumab  Maintenance 02/13/2022: treatment HELD due to diarrhea.  03/06/2022: Cycle 22 Day 1 of Atezolizumab  Maintenance 03/27/2022: Cycle 23 Day 1 of Atezolizumab  Maintenance 04/17/2022: Cycle 24 Day 1 of Atezolizumab  Maintenance 05/08/2022:  Cycle 25 Day 1 of Atezolizumab  Maintenance (Held due to patient preference for treatment holiday) 05/29/2022: Cycle 25 Day 1 of Atezolizumab  Maintenance  06/19/2022: Cycle 26 Day 1 of Atezolizumab  Maintenance  07/12/2022: Cycle 27 Day 1 of Atezolizumab  Maintenance 07/31/2022: Cycle 28 Day 1 of Atezolizumab  Maintenance HELD per patient request for fatigue.  08/22/2022: Cycle 28 Day 1 of Atezolizumab  Maintenance 09/11/2022: Cycle 29 Day 1 of Atezolizumab  Maintenance 10/03/2022: Cycle 30 Day 1 of Atezolizumab  Maintenance 10/23/2022: Cycle 31 Day 1 of Atezolizumab  Maintenance 11/13/2022: Cycle 32 Day 1 of Atezolizumab  Maintenance 12/24/2022: Cycle 33 Day 1 of Atezolizumab  Maintenance 01/15/2023: Cycle 34 Day 1 of Atezolizumab  Maintenance 02/05/2023: Cycle 35 Day 1 of Atezolizumab  Maintenance 02/26/2023: Cycle 36 Day 1 of Atezolizumab  Maintenance 04/28/2023: Cycle 37 Day 1 of Atezolizumab  Maintenance 04/30/2023: Cycle 38 Day 1 of Atezolizumab  Maintenance 05/21/2023: Cycle 39 Day 1 of  Atezolizumab  Maintenance 06/11/2023: Cycle 40 Day 1 of Atezolizumab  Maintenance 07/28/2023: Cycle 41 Day 1 of Atezolizumab  Maintenance 08/18/2023: Cycle 42 Day 1 of Atezolizumab  Maintenance 09/08/2023: Cycle 43 Day 1 of Atezolizumab  Maintenance 09/30/2023:  Cycle  44 Day 1 of Atezolizumab  Maintenance 10/21/2023: Cycle 45 Day 1 of Atezolizumab  Maintenance 11/11/2023: Cycle 46 Day 1 of Atezolizumab  Maintenance 12/03/2023: Cycle 47 Day 1 of Atezolizumab  Maintenance  Interval History:  [name] 68 y.o. female with medical history significant for extensive stage small cell lung cancer who presents for a follow up visit. The patient's last visit was on 11/11/2023.  She presents today to start cycle 48 of maintenance atezolizumab . She is unaccompanied for this visit.   On exam today [name] reports she will not be having Thanksgiving on Thursday but will likely be having a meal on Saturday with her grandchildren.  She reports there will be turkey.  She reports her treatments are going well and overall everything is okay.  She reports that she still has very low motivation and energy.  She is having some nausea but no vomiting or diarrhea.  She is having some fevers but no chills or sweats.  She reports her appetite remains weak and that she has to force herself to eat.  She notes that she is not having any other focal symptoms at this time.  She denies any runny nose, sore throat, cough.  Overall she is willing and able to proceed with immunotherapy treatment today.  MEDICAL HISTORY:  Past Medical History:  Diagnosis Date   Allergic rhinitis    Anemia    Anxiety    Chicken pox    Chronic back pain    COPD (chronic obstructive pulmonary disease) (HCC)    Depression    DM (diabetes mellitus) (HCC)    Essential hypertension    GERD (gastroesophageal reflux disease)    Headache    migraines   History of gastritis    EGD 2015   History of home oxygen  therapy    2 liters at hs last 6 months    Hyperlipidemia    Hypothyroidism    Migraines    Osteoarthritis    oa   Scoliosis    Small cell lung cancer (HCC)    Stage 4    SURGICAL HISTORY: Past Surgical History:  Procedure Laterality Date   APPENDECTOMY     1985   BIOPSY  07/24/2017   Procedure: BIOPSY;  Surgeon: [name], MD;  Location: [facility];  Service: Endoscopy;;   CARDIAC CATHETERIZATION N/A 10/31/2015   Procedure: Left Heart Cath and Coronary Angiography;  Surgeon: Alm LELON Clay, MD;  [facility] INVASIVE CV LAB;  Service: Cardiovascular;  Laterality: N/A;   CARPAL TUNNEL RELEASE Left    CARPAL TUNNEL RELEASE Right    CATARACT EXTRACTION W/PHACO Left 02/07/2023   Procedure: CATARACT EXTRACTION PHACO AND INTRAOCULAR LENS PLACEMENT (IOC);  Surgeon: Juli Blunt, MD;  Location: [facility];  Service: Ophthalmology;  Laterality: Left;  CDE: 3.51   CATARACT EXTRACTION W/PHACO Right 02/21/2023   Procedure: CATARACT EXTRACTION PHACO AND INTRAOCULAR LENS PLACEMENT (IOC);  Surgeon: Juli Blunt, MD;  Location: [facility];  Service: Ophthalmology;  Laterality: Right;  CDE: 2.94   CHOLECYSTECTOMY  late 1980's   COLONOSCOPY WITH PROPOFOL  N/A 07/24/2017   Procedure: COLONOSCOPY WITH PROPOFOL ;  Surgeon: [name], MD;  Location: [facility];  Service: Endoscopy;  Laterality: N/A;   ESOPHAGOGASTRODUODENOSCOPY N/A 07/24/2017   Procedure: ESOPHAGOGASTRODUODENOSCOPY (EGD);  Surgeon: [name], MD;  Location: [facility];  Service: Endoscopy;  Laterality: N/A;   ESOPHAGOGASTRODUODENOSCOPY (EGD) WITH PROPOFOL  N/A 10/19/2020   Procedure: ESOPHAGOGASTRODUODENOSCOPY (EGD) WITH PROPOFOL ;  Surgeon: [name], MD;  Location: [facility];  Service: Endoscopy;  Laterality: N/A;   FINE NEEDLE ASPIRATION N/A 10/19/2020   Procedure: FINE NEEDLE ASPIRATION (FNA) LINEAR;  Surgeon: [name], MD;  Location: [facility];  Service: Endoscopy;  Laterality: N/A;   GALLBLADDER SURGERY  1991   HIP CLOSED  REDUCTION Right 01/08/2016   Procedure: CLOSED MANIPULATION HIP;  Surgeon: Reyes Billing, MD;  Location: [facility];  Service: Orthopedics;  Laterality: Right;   HIP CLOSED REDUCTION Right 01/19/2016   Procedure: ATTEMPTED CLOSED REDUCTION RIGHT HIP;  Surgeon: Norleen Armor, MD;  Location: [facility];  Service: Orthopedics;  Laterality: Right;   HIP CLOSED REDUCTION Right 01/20/2016   Procedure: CLOSED REDUCTION RIGHT TOTAL HIP;  Surgeon: Donnice Car, MD;  Location: [facility];  Service: Orthopedics;  Laterality: Right;   HIP CLOSED REDUCTION Right 02/17/2016   Procedure: CLOSED REDUCTION RIGHT TOTAL HIP;  Surgeon: Redell Shoals, MD;  Location: [facility];  Service: Orthopedics;  Laterality: Right;   HIP CLOSED REDUCTION Right 02/28/2016   Procedure: CLOSED REDUCTION HIP;  Surgeon: Selinda Belvie Gosling, MD;  Location: [facility];  Service: Orthopedics;  Laterality: Right;   IR IMAGING GUIDED PORT INSERTION  11/01/2020   POLYPECTOMY  07/24/2017   Procedure: POLYPECTOMY;  Surgeon: [name], MD;  Location: [facility];  Service: Endoscopy;;   TONSILLECTOMY     TOTAL ABDOMINAL HYSTERECTOMY     1985, with 1 ovary removed and 2 nd ovary removed 2003   TOTAL HIP ARTHROPLASTY Right    Original surgery 2006 with revision 2010   TOTAL HIP REVISION Right 01/01/2016   Procedure: TOTAL HIP REVISION;  Surgeon: Donnice Car, MD;  Location: [facility];  Service: Orthopedics;  Laterality: Right;   TOTAL HIP REVISION Right 03/02/2016   Procedure: TOTAL HIP REVISION;  Surgeon: Car Donnice, MD;  Location: [facility];  Service: Orthopedics;  Laterality: Right;   TOTAL HIP REVISION Right 09/02/2016   Procedure: Right hip constrained liner- posterior;  Surgeon: Car Donnice, MD;  Location: [facility];  Service: Orthopedics;  Laterality: Right;   ULNAR NERVE TRANSPOSITION Right    UPPER ESOPHAGEAL ENDOSCOPIC ULTRASOUND (EUS) N/A 10/19/2020   Procedure: UPPER ESOPHAGEAL ENDOSCOPIC ULTRASOUND (EUS);  Surgeon: [name], MD;  Location: [facility];  Service: Endoscopy;  Laterality: N/A;  periduodenal lesion    SOCIAL HISTORY: Social History   Socioeconomic History   Marital status: Married    Spouse name: Not on file   Number of children: 2   Years of education: Not on file   Highest education level: 12th grade  Occupational History   Occupation: disabled   Occupation: disabled  Tobacco Use   Smoking status: Every Day    Current packs/day: 2.00    Average packs/day: 2.0 packs/day for 46.0 years (92.0 ttl pk-yrs)    Types: Cigarettes   Smokeless tobacco: Never   Tobacco comments:    2 packs of cigarettes smoked daily 12/13/21- declines smoking cessation  Vaping Use   Vaping status: Never Used  Substance and Sexual Activity   Alcohol use: No   Drug use: No   Sexual activity: Not Currently    Partners: Male  Other Topics Concern   Not on file  Social History Narrative   Right handed    Caffeine~ 2 cups per day    Lives at home with husband (strained relationship)   Primary caretaker for disabled brother who had aneurism   Daughter died 07-01-2018   Social Drivers of Corporate Investment Banker Strain:  Low Risk (07/31/2023)   Received from [facility]   Overall Financial Resource Strain (CARDIA)    How hard is it for you to pay for the very basics like food, housing, medical care, and heating?: Not very hard  Food Insecurity: No Food Insecurity (07/31/2023)   [facility]   Hunger Vital Sign    Within the past 12 months, you worried that your food would run out before you got the money to buy more.: Never true    Within the past 12 months, the food you bought just didn't last and you didn't have money to get more.: Never true  Recent Concern: Food Insecurity - Food Insecurity Present (05/20/2023)   Hunger Vital Sign    Worried About Running Out of Food in the Last Year: Sometimes true    Ran Out of Food in the Last Year: Sometimes true  Transportation Needs: No Transportation Needs  (07/31/2023)   Received from [facility]   PRAPARE - Transportation    Lack of Transportation (Medical): No    Lack of Transportation (Non-Medical): No  Physical Activity: Inactive (05/20/2023)   Exercise Vital Sign    Days of Exercise per Week: 0 days    Minutes of Exercise per Session: 0 min  Stress: No Stress Concern Present (05/20/2023)   [employer] of Occupational Health - Occupational Stress Questionnaire    Feeling of Stress : Only a little  Recent Concern: Stress - Stress Concern Present (03/31/2023)   [employer] of Occupational Health - Occupational Stress Questionnaire    Feeling of Stress : Very much  Social Connections: Moderately Integrated (05/20/2023)   Social Connection and Isolation Panel    Frequency of Communication with Friends and Family: More than three times a week    Frequency of Social Gatherings with Friends and Family: More than three times a week    Attends Religious Services: 1 to 4 times per year    Active Member of [employer] or Organizations: No    Attends Banker Meetings: Never    Marital Status: Married  Catering Manager Violence: Not At Risk (07/31/2023)   Received from [facility]   Humiliation, Afraid, Rape, and Kick questionnaire    Within the last year, have you been afraid of your partner or ex-partner?: No    Within the last year, have you been humiliated or emotionally abused in other ways by your partner or ex-partner?: No    Within the last year, have you been kicked, hit, slapped, or otherwise physically hurt by your partner or ex-partner?: No    Within the last year, have you been raped or forced to have any kind of sexual activity by your partner or ex-partner?: No    FAMILY HISTORY: Family History  Problem Relation Age of Onset   COPD Mother    Heart disease Mother    Lung disease Father        Asbestosis   Heart attack Father    Heart disease Father    Cerebral aneurysm Brother    Aneurysm Brother         Brain   Drug abuse Daughter    Epilepsy Son    Alcohol abuse Son    Drug abuse Son    Arthritis Maternal Grandmother    Heart disease Maternal Grandmother    Asthma Maternal Grandfather    Cancer Maternal Grandfather    Arthritis Paternal Grandmother    Heart disease Paternal  Grandmother    Stroke Paternal Grandmother    Early death Paternal Grandfather    Heart disease Paternal Grandfather     ALLERGIES:  is allergic to nisoldipine, metformin  and related, nsaids, pregabalin, wellbutrin  [bupropion ], aleve [naproxen sodium], penicillins, and sulfonamide derivatives.  MEDICATIONS:  Current Outpatient Medications  Medication Sig Dispense Refill   albuterol  (PROAIR  HFA) 108 (90 Base) MCG/ACT inhaler 2 puffs every 4 hours as needed only  if your can't catch your breath 18 g 5   ALPRAZolam  (XANAX ) 1 MG tablet TAKE 1 TABLET BY MOUTH THREE TIMES DAILY AS NEEDED FOR ANXIETY 90 tablet 2   ARIPiprazole  (ABILIFY ) 5 MG tablet Take 1 tablet (5 mg total) by mouth daily. 30 tablet 5   atorvastatin  (LIPITOR) 20 MG tablet TAKE 1 TABLET BY MOUTH AT BEDTIME 90 tablet 1   benzonatate  (TESSALON ) 100 MG capsule Take 1 capsule (100 mg total) by mouth 3 (three) times daily. 90 capsule 1   calcipotriene  (DOVONOX) 0.005 % cream Apply to affected area bid when on break from steroid cream 120 g 3   ciprofloxacin  (CIPRO ) 250 MG tablet Take 1 tablet (250 mg total) by mouth 2 (two) times daily. 10 tablet 0   cyclobenzaprine  (FLEXERIL ) 10 MG tablet Take 10 mg by mouth 3 (three) times daily as needed.     dicyclomine  (BENTYL ) 10 MG capsule TAKE 1 CAPSULE BY MOUTH EVERY 8 HOURS AS NEEDED FOR SPASMS 60 capsule 1   diphenoxylate -atropine  (LOMOTIL ) 2.5-0.025 MG tablet TAKE 1 TABLET BY MOUTH FOUR TIMES DAILY AS NEEDED FOR diarrhea OR loose stools 60 tablet 0   DULoxetine  (CYMBALTA ) 60 MG capsule TAKE 2 CAPSULES BY MOUTH EVERY DAY 180 capsule 1   ferrous sulfate  325 (65 FE) MG EC tablet TAKE 1 TABLET BY MOUTH DAILY WITH  BREAKFAST 30 tablet 3   fluconazole  (DIFLUCAN ) 150 MG tablet Take 150 mg by mouth once.     fluticasone  (FLONASE ) 50 MCG/ACT nasal spray INSTILL 2 SPRAYS IN EACH NOSTRIL EVERY DAY 16 g 6   furosemide  (LASIX ) 20 MG tablet TAKE 1 TABLET BY MOUTH TWICE DAILY, IF SWELLING IMPROVES CAN HOLD MEDICATION 90 tablet 0   HYDROcodone -acetaminophen  (NORCO) 10-325 MG tablet Take 1 tablet by mouth every 4 (four) hours as needed. 180 tablet 0   HYDROmorphone  (DILAUDID ) 4 MG tablet Take 1 tablet (4 mg total) by mouth every 6 (six) hours as needed for severe pain (pain score 7-10). Increased dose because 2 mg wasn't working well enough- will send in another supply so has enough pills- G89.2- chronic pain due to cancer 120 tablet 0   hydrOXYzine  (ATARAX ) 10 MG tablet Take 1 tablet (10 mg total) by mouth every 6 (six) hours as needed. 30 tablet 2   levothyroxine  (SYNTHROID ) 137 MCG tablet Take 1 tablet (137 mcg total) by mouth daily before breakfast. 90 tablet 3   lidocaine -prilocaine  (EMLA ) cream Apply 1 Application topically as needed. 30 g 2   loratadine  (CLARITIN ) 10 MG tablet Take 10 mg by mouth daily.     meclizine  (ANTIVERT ) 12.5 MG tablet Take 1 tablet (12.5 mg total) by mouth 2 (two) times daily. 60 tablet 2   meloxicam  (MOBIC ) 15 MG tablet TAKE 1 TABLET BY MOUTH EVERY DAY 30 tablet 2   NARCAN  4 MG/0.1ML LIQD nasal spray kit SMARTSIG:Both Nares     nystatin  (MYCOSTATIN ) 100000 UNIT/ML suspension      OLANZapine  (ZYPREXA ) 10 MG tablet Take 1 tablet (10 mg total) by mouth at bedtime. 30 tablet  2   omeprazole  (PRILOSEC) 40 MG capsule Take 1 capsule (40 mg total) by mouth 2 (two) times daily. 180 capsule 3   ondansetron  (ZOFRAN ) 8 MG tablet TAKE 1 TABLET BY MOUTH TWICE DAILY 60 tablet 1   permethrin  (ELIMITE ) 5 % cream      potassium chloride  SA (KLOR-CON  M) 20 MEQ tablet TAKE 1 TABLET BY MOUTH TWICE DAILY 60 tablet 2   prochlorperazine  (COMPAZINE ) 10 MG tablet Take 1 tablet (10 mg total) by mouth every 6 (six)  hours as needed for nausea or vomiting. 30 tablet 0   Rimegepant Sulfate (NURTEC) 75 MG TBDP TAKE 1 TABLET BY MOUTH DAILY AS NEEDED FOR MIGRAINE, take WITHIN 15 MINUTES of initial symptoms 8 tablet 1   topiramate  (TOPAMAX ) 100 MG tablet Take 1 tablet (100 mg total) by mouth at bedtime. 90 tablet 1   triamcinolone  ointment (KENALOG ) 0.5 % Apply 1 Application topically 3 (three) times daily. Apply thin-layer on affected areas, no more than 2 weeks consistently. 60 g 2   triamterene -hydrochlorothiazide  (MAXZIDE -25) 37.5-25 MG tablet Take 1 tablet by mouth daily.     umeclidinium-vilanterol (ANORO ELLIPTA ) 62.5-25 MCG/ACT AEPB Inhale 1 puff into the lungs daily. 60 each 11   No current facility-administered medications for this visit.    REVIEW OF SYSTEMS:   Constitutional: ( - ) fevers, ( - )  chills , ( - ) night sweats Eyes: ( - ) blurriness of vision, ( - ) double vision, ( - ) watery eyes Ears, nose, mouth, throat, and face: ( - ) mucositis, ( - ) sore throat Respiratory: ( - ) cough, ( -) dyspnea, ( - ) wheezes Cardiovascular: ( - ) palpitation, ( - ) chest discomfort, ( + ) lower extremity swelling Gastrointestinal:  ( -) nausea, ( - ) heartburn, ( - ) change in bowel habits Skin: ( - ) abnormal skin rashes Lymphatics: ( - ) new lymphadenopathy, ( - ) easy bruising Neurological: (+ ) numbness, ( - ) tingling, ( - ) new weaknesses Behavioral/Psych: ( - ) mood change, ( - ) new changes  All other systems were reviewed with the patient and are negative.  PHYSICAL EXAMINATION: ECOG PERFORMANCE STATUS: 1 - Symptomatic but completely ambulatory  Vitals:   12/24/23 1117  BP: 132/74  Pulse: 81  Resp: 15  Temp: 97.8 F (36.6 C)  SpO2: 99%        Filed Weights   12/24/23 1117  Weight: 189 lb 4.8 oz (85.9 kg)     GENERAL: Well-appearing middle-age Caucasian female, alert, no distress and comfortable SKIN: skin color, texture, turgor are normal, no rashes or significant  lesions EYES: conjunctiva are pink and non-injected, sclera clear LUNGS:  normal breathing effort. Diffuse wheezing hear on ausculation.  HEART: regular rate & rhythm and no murmurs.  Musculoskeletal: no cyanosis of digits and no clubbing  PSYCH: alert & oriented x 3, fluent speech NEURO: no focal motor/sensory deficits  LABORATORY DATA:  I have reviewed the data as listed    Latest Ref Rng & Units 12/24/2023   10:37 AM 12/03/2023   10:15 AM 11/11/2023   10:15 AM  CBC  WBC 4.0 - 10.5 K/uL 8.0  7.3  7.1   Hemoglobin 12.0 - 15.0 g/dL 86.4  85.8  85.7   Hematocrit 36.0 - 46.0 % 41.0  42.6  43.1   Platelets 150 - 400 K/uL 147  151  152        Latest Ref Rng &  Units 12/24/2023   10:37 AM 12/03/2023   10:15 AM 11/11/2023   10:15 AM  CMP  Glucose 70 - 99 mg/dL 87  95  93   BUN 8 - 23 mg/dL 14  13  15    Creatinine 0.44 - 1.00 mg/dL 8.81  8.91  8.83   Sodium 135 - 145 mmol/L 140  140  141   Potassium 3.5 - 5.1 mmol/L 4.0  3.6  4.0   Chloride 98 - 111 mmol/L 103  107  108   CO2 22 - 32 mmol/L 29  28  28    Calcium  8.9 - 10.3 mg/dL 8.3  8.3  8.8   Total Protein 6.5 - 8.1 g/dL 6.1  5.9  5.9   Total Bilirubin 0.0 - 1.2 mg/dL 0.3  0.3  0.4   Alkaline Phos 38 - 126 U/L 87  72  78   AST 15 - 41 U/L 19  14  11    ALT 0 - 44 U/L 13  8  8      No results found for: MPROTEIN Lab Results  Component Value Date   KPAFRELGTCHN 0.75 06/30/2014   LAMBDASER 3.78 (H) 06/30/2014   KAPLAMBRATIO 0.20 (L) 06/30/2014     RADIOGRAPHIC STUDIES: No results found.     ASSESSMENT & PLAN [name] is a 68 y.o. female with medical history significant for extensive stage small cell lung cancer who presents for a follow up visit.      # Small Cell Lung Cancer, Extensive Stage -- MRI of the brain shows no evidence of intracranial spread --Findings are currently consistent with metastatic small cell lung cancer with metastatic spread to the lymph nodes of the abdomen -- Started carboplatin ,  etoposide , and atezolizumab  on 11/13/2020. Transitioned to maintenance Atezolizumab  on 02/22/2021.  Plan: --today is Cycle 48 Day 1 of Atezolizumab  maintenance.  --labs today were reviewed and adequate for treatment.  WBC 8.0, hemoglobin 13.5, MCV 91.5, platelets 147, creatinine overall stable at 1.18, LFTs in range. -- Most recent CT CAP from 10/15/2023 which shows no evidence of recurrence. Repeat scan due in December 2025.  -- No prohibitive toxicities identified.  Proceed with treatment without any dose modifications.  --RTC in 3 weeks for a follow up before Cycle 49  # Early Cirrhosis --noted on MR abdomen from 05/22/2022. US  with elastography performed on 06/18/2022. --Most recent CT scan from 10/15/2023 showed slight enlargement of lateral left lobe of liver with questionable contour nodularity. Dr. Leigh reports repeat RUQ US  in 6 months (March 2026).  #Right renal lesion: --Noted to be benign on most recent MRI of the abdomen.  #Diarrhea -- Previously sent stool studies for C. Diff and GI pathology panel, all negative --patient following with GI and found to have pancreatic insufficiency and started on creon .  #Nausea: --Symptoms improved with zofran  and olanzapine .  --Added IV zofran  to infusion premeds for better symptom control.  --Will sent compazine  for breakthrough nausea.   #Hypokalemia:  --currently taking potassium chloride  20 meq BID.   --potassium level is 3.6 today. Continue PO supplementation.   #Lower extremity edema --Currently takes lasix  20 mg once daily day. --Advised to  wear compression stockings.   #Neuropathy involving fingers/feet: --Patient currently takes Cymbalta    #Supportive Care -- chemotherapy education complete -- port placed -- zofran  8mg  q8H PRN and compazine  10mg  PO q6H for nausea -- EMLA  cream for port  Orders Placed This Encounter  Procedures   Culture, Urine    Standing Status:  Future    Number of Occurrences:   1     Expected Date:   12/24/2023    Expiration Date:   12/23/2024   Urinalysis, Complete w Microscopic    Standing Status:   Future    Number of Occurrences:   1    Expected Date:   12/24/2023    Expiration Date:   12/23/2024   All questions were answered. The patient knows to call the clinic with any problems, questions or concerns.  I have spent a total of 30 minutes minutes of face-to-face and non-face-to-face time, preparing to see the patient, performing a medically appropriate examination, counseling and educating the patient, ordering medications/tests, documenting clinical information in the electronic health record, and care coordination.   Norleen IVAR Kidney, MD Department of Hematology/Oncology [facility] Phone: [phone] Pager: [phone] Email: [email or web address]  12/28/2023 10:03 AM

## 2023-12-24 NOTE — Progress Notes (Signed)
 Nutrition Follow-up:  Patient with extensive stage small cell lung cancer. She is receiving maintenance therapy with atezolizumab  q21d.   Met with patient in infusion. She is doing fairly well. Reports poor appetite. Patient makes herself eat a little something everyday. Yesterday had 2 servings of creamed potatoes with gravy and green peas as well as 3 vanilla cream cookies. Patient reports bilateral lower extremity swelling. She is wearing compression socks. MD is aware. Patient says she is having memory issues. Getting days confused. States she did not know Thanksgiving was tomorrow until her husband mentioned this yesterday. Patient discussed this with MD.    Medications: reviewed   Labs: Cr 1.18, Ca 8.3 (albumin  WNL)  Anthropometrics: Wt 189 lb 4.8 oz today - increased (suspect r/t fluid)  11/5 - 182 lb  10/14 - 178 lb 8 oz   NUTRITION DIAGNOSIS: Inadequate oral intake - continues     INTERVENTION:  Continued encouragement for eating small meals/snacks throughout the day - recommend protein source at every meal Urinalysis is pending  Support and encouragement     MONITORING, EVALUATION, GOAL: wt trends, intake   NEXT VISIT: Wednesday December 17 during infusion

## 2023-12-24 NOTE — Patient Instructions (Signed)
 [facility] - A DEPT OF [facility]  Discharge Instructions: Thank you for choosing [facility] to provide your oncology and hematology care.   If you have a lab appointment with the Cancer Center, please go directly to the Cancer Center and check in at the registration area.   Wear comfortable clothing and clothing appropriate for easy access to any Portacath or PICC line.   We strive to give you quality time with your provider. You may need to reschedule your appointment if you arrive late (15 or more minutes).  Arriving late affects you and other patients whose appointments are after yours.  Also, if you miss three or more appointments without notifying the office, you may be dismissed from the clinic at the provider's discretion.      For prescription refill requests, have your pharmacy contact our office and allow 72 hours for refills to be completed.    Today you received the following chemotherapy and/or immunotherapy agents: Atezolizumab  (Tecentriq )    To help prevent nausea and vomiting after your treatment, we encourage you to take your nausea medication as directed.  BELOW ARE SYMPTOMS THAT SHOULD BE REPORTED IMMEDIATELY: *FEVER GREATER THAN 100.4 F (38 C) OR HIGHER *CHILLS OR SWEATING *NAUSEA AND VOMITING THAT IS NOT CONTROLLED WITH YOUR NAUSEA MEDICATION *UNUSUAL SHORTNESS OF BREATH *UNUSUAL BRUISING OR BLEEDING *URINARY PROBLEMS (pain or burning when urinating, or frequent urination) *BOWEL PROBLEMS (unusual diarrhea, constipation, pain near the anus) TENDERNESS IN MOUTH AND THROAT WITH OR WITHOUT PRESENCE OF ULCERS (sore throat, sores in mouth, or a toothache) UNUSUAL RASH, SWELLING OR PAIN  UNUSUAL VAGINAL DISCHARGE OR ITCHING   Items with * indicate a potential emergency and should be followed up as soon as possible or go to the Emergency Department if any problems should occur.  Please show the CHEMOTHERAPY ALERT CARD or  IMMUNOTHERAPY ALERT CARD at check-in to the Emergency Department and triage nurse.  Should you have questions after your visit or need to cancel or reschedule your appointment, please contact [facility] - A DEPT OF [facility]  Dept: [phone]  and follow the prompts.  Office hours are 8:00 a.m. to 4:30 p.m. Monday - Friday. Please note that voicemails left after 4:00 p.m. may not be returned until the following business day.  We are closed weekends and major holidays. You have access to a nurse at all times for urgent questions. Please call the main number to the clinic Dept: [phone] and follow the prompts.   For any non-urgent questions, you may also contact your provider using MyChart. We now offer e-Visits for anyone 53 and older to request care online for non-urgent symptoms. For details visit [email or web address].   Also download the MyChart app! Go to the app store, search MyChart, open the app, select [facility], and log in with your MyChart username and password.

## 2023-12-25 LAB — T4: T4, Total: 9.4 ug/dL (ref 4.5–12.0)

## 2023-12-26 ENCOUNTER — Telehealth: Payer: Self-pay | Admitting: *Deleted

## 2023-12-26 ENCOUNTER — Other Ambulatory Visit: Payer: Self-pay | Admitting: Physician Assistant

## 2023-12-26 LAB — URINE CULTURE: Culture: >=100000 — AB

## 2023-12-26 MED ORDER — CIPROFLOXACIN HCL 250 MG PO TABS: 250.0000 mg | ORAL_TABLET | Freq: Two times a day (BID) | ORAL | 0 refills | Status: DC

## 2023-12-26 NOTE — Telephone Encounter (Signed)
 Received call from pt regarding u/a and culture results. Advised that I would have Johnston Police, PA-C review her lab results. Per Johnston, pt does have a UTI and she has called in Cipro  for 5 days.  TCT patient and spoke with her. Advised that Johnston Police, PA-C  has sent in a prescription for antibiotics for 5 days to her pharmacy in [place].  Pt voiced understanding.

## 2023-12-28 ENCOUNTER — Encounter: Payer: Self-pay | Admitting: Hematology and Oncology

## 2023-12-30 ENCOUNTER — Ambulatory Visit (INDEPENDENT_AMBULATORY_CARE_PROVIDER_SITE_OTHER): Admitting: Pulmonary Disease

## 2023-12-30 ENCOUNTER — Encounter (HOSPITAL_BASED_OUTPATIENT_CLINIC_OR_DEPARTMENT_OTHER): Payer: Self-pay | Admitting: Pulmonary Disease

## 2023-12-30 ENCOUNTER — Other Ambulatory Visit (HOSPITAL_BASED_OUTPATIENT_CLINIC_OR_DEPARTMENT_OTHER): Payer: Self-pay

## 2023-12-30 VITALS — BP 126/68 | HR 88 | Temp 98.1°F | Ht 62.0 in | Wt 189.0 lb

## 2023-12-30 DIAGNOSIS — F17210 Nicotine dependence, cigarettes, uncomplicated: Secondary | ICD-10-CM | POA: Diagnosis not present

## 2023-12-30 DIAGNOSIS — J449 Chronic obstructive pulmonary disease, unspecified: Secondary | ICD-10-CM

## 2023-12-30 MED ORDER — AREXVY 120 MCG/0.5ML IM SUSR
0.5000 mL | Freq: Once | INTRAMUSCULAR | 0 refills | Status: AC
  Filled 2023-12-30: qty 0.5, 1d supply, fill #0

## 2023-12-30 NOTE — Progress Notes (Signed)
 @Patient  ID: [name], female    DOB: 1955/03/03, 68 y.o.   MRN: [id number]  Chief Complaint  Patient presents with   COPD    Ms. Zlaty Alexa is a 68 year old female active smoker with COPD and chronic respiratory failure, scoliosis and DDD who presents for follow-up.  Since our last visit she was started on Trelegy. She reports shortness of breath is occurring more frequently and seems to occur in morning and late evenings. Wheezing and coughing with yellow-gray sputum are stable. Reports upper chest congestion. She has been checking her oxygen  84-88% in the morning. She reports confusion in the mornings.  At baseline, she has shortness of breath with talking and short distances. She is able to climb stairs one step at a time and goes slow pace. Her activity is limited due to back and bilateral hip pain s/p multiple hip surgeries. Endorses wheezing daily. Has chronic cough with some yellow-gray sputum production. She is not compliant with her home oxygen  for > 1 year due to difficulty tolerate the tubing. She has tried Spiriva  which she tolerated however insurance did not pay for it. Nebulizers were tolerable however the setup and time spent on the machine was cumbersome.  11/10/20 Since our last visit, she was diagnosed with small cell carcinoma of lung confirmed via EUS. She is scheduled for treatment starting 11/13/20.   She reports she is coughing with chest congestion. She has decreased her cigarettes from 2.5 ppd cigarettes to 1.5 ppd. Denies wheezing. She is compliant with her Trelegy. Symptoms worsen at night. She uses her rescue inhaler 2-3 times a week.  04/10/21 Since our last visit she has completed chemotherapy and now on IT. Has some nausea. She is compliant with her Trelegy but has run out due to cost (>$100). She has shortness of breath and cough off the inhaler. She is compliant with nocturnal oxygen  and currently happy with her current insurance company. She continues  to smoke 2 ppd due to home stressors.  03/08/22 Since our last visit she has been on Atezolizumab  maintenance for extensive stage small cell lung cancer. This is currently held due to diarrhea. Since our last visit she is on Breztri . Uses albuterol  three times a month. Reports congestion in chest, shortness of breath and daily wheeze. Does not perform regular activity. She reports active smoking 2 ppd. Not ready to quit. She is not compliant with her oxygen  at night. She feels it is dangerous to use because the tubing catches on her neck. Has to use her walker and worried this will interfere it.  09/13/22 Since our last visit she was prescribed Breztri  but changed to Trelegy 200 on 06/18/22. Using albuterol  three times a week. She no longer wears oxygen  at night and felt like she was choking on it. She reports that she gets choked while swallowing for 6- 8 months. She reports feeling short of breath with frequent coughing during meals especially. She has even slowed down her eating more than usual.   02/17/23 Since our last visit she has been on Trelegy. Using albuterol  once a week for shortness of breath and productive coughing. Uses robitussin often. Continues to smoke 2ppd and not interested in quitting. No exacerbations. Present with friend - previously friends with daughter.  05/22/23 Since our last visit she has had good days and bad days. Developed thrush due Trelegy use and has not restarted. Using albuterol  up to 2 times a day due to shortness of breath associated with  wheezing and cough. Cough is productive. Also had recent fall when tripping on her pants followed by hitting the coffee table on right side. Has bruising that also limits her ability to cough.  12/30/23 Since our last visit she reports she is overall doing ok physically but struggling mentally with her current divorce. Planning to move to [place]  this month. She is on Anoro. Last exacerbation 04/2023. She is planning to continue  maintenance chemotherapy for her small cell lung cancer, with no recurrence on recent imaging. She plans to visit her brother who has multiple cardiac issues and lives in [place] until she officially stays in [place] . She is compliant with Anoro. She is still smoking 2 ppd  Social History: Active smoker Daughter passed from OD two years ago Currently has grandson living with her, stressor. Husband is not supportive   Past Medical History:  Diagnosis Date   Allergic rhinitis    Anemia    Anxiety    Chicken pox    Chronic back pain    COPD (chronic obstructive pulmonary disease) (HCC)    Depression    DM (diabetes mellitus) (HCC)    Essential hypertension    GERD (gastroesophageal reflux disease)    Headache    migraines   History of gastritis    EGD 2015   History of home oxygen  therapy    2 liters at hs last 6 months   Hyperlipidemia    Hypothyroidism    Migraines    Osteoarthritis    oa   Scoliosis    Small cell lung cancer (HCC)    Stage 4    Outpatient Medications Prior to Visit  Medication Sig Dispense Refill   albuterol  (PROAIR  HFA) 108 (90 Base) MCG/ACT inhaler 2 puffs every 4 hours as needed only  if your can't catch your breath 18 g 5   ALPRAZolam  (XANAX ) 1 MG tablet TAKE 1 TABLET BY MOUTH THREE TIMES DAILY AS NEEDED FOR ANXIETY 90 tablet 2   ARIPiprazole  (ABILIFY ) 5 MG tablet Take 1 tablet (5 mg total) by mouth daily. 30 tablet 5   atorvastatin  (LIPITOR) 20 MG tablet TAKE 1 TABLET BY MOUTH AT BEDTIME 90 tablet 1   benzonatate  (TESSALON ) 100 MG capsule Take 1 capsule (100 mg total) by mouth 3 (three) times daily. 90 capsule 1   calcipotriene  (DOVONOX) 0.005 % cream Apply to affected area bid when on break from steroid cream 120 g 3   ciprofloxacin  (CIPRO ) 250 MG tablet Take 1 tablet (250 mg total) by mouth 2 (two) times daily. 10 tablet 0   cyclobenzaprine  (FLEXERIL ) 10 MG tablet Take 10 mg by mouth 3 (three) times daily as needed.     dicyclomine  (BENTYL ) 10  MG capsule TAKE 1 CAPSULE BY MOUTH EVERY 8 HOURS AS NEEDED FOR SPASMS 60 capsule 1   diphenoxylate -atropine  (LOMOTIL ) 2.5-0.025 MG tablet TAKE 1 TABLET BY MOUTH FOUR TIMES DAILY AS NEEDED FOR diarrhea OR loose stools 60 tablet 0   DULoxetine  (CYMBALTA ) 60 MG capsule TAKE 2 CAPSULES BY MOUTH EVERY DAY 180 capsule 1   ferrous sulfate  325 (65 FE) MG EC tablet TAKE 1 TABLET BY MOUTH DAILY WITH BREAKFAST 30 tablet 3   fluconazole  (DIFLUCAN ) 150 MG tablet Take 150 mg by mouth once.     fluticasone  (FLONASE ) 50 MCG/ACT nasal spray INSTILL 2 SPRAYS IN EACH NOSTRIL EVERY DAY 16 g 6   furosemide  (LASIX ) 20 MG tablet TAKE 1 TABLET BY MOUTH TWICE DAILY, IF SWELLING IMPROVES CAN  HOLD MEDICATION 90 tablet 0   HYDROcodone -acetaminophen  (NORCO) 10-325 MG tablet Take 1 tablet by mouth every 4 (four) hours as needed. 180 tablet 0   HYDROmorphone  (DILAUDID ) 4 MG tablet Take 1 tablet (4 mg total) by mouth every 6 (six) hours as needed for severe pain (pain score 7-10). Increased dose because 2 mg wasn't working well enough- will send in another supply so has enough pills- G89.2- chronic pain due to cancer 120 tablet 0   hydrOXYzine  (ATARAX ) 10 MG tablet Take 1 tablet (10 mg total) by mouth every 6 (six) hours as needed. 30 tablet 2   levothyroxine  (SYNTHROID ) 137 MCG tablet Take 1 tablet (137 mcg total) by mouth daily before breakfast. 90 tablet 3   loratadine  (CLARITIN ) 10 MG tablet Take 10 mg by mouth daily.     meclizine  (ANTIVERT ) 12.5 MG tablet Take 1 tablet (12.5 mg total) by mouth 2 (two) times daily. 60 tablet 2   meloxicam  (MOBIC ) 15 MG tablet TAKE 1 TABLET BY MOUTH EVERY DAY 30 tablet 2   NARCAN  4 MG/0.1ML LIQD nasal spray kit SMARTSIG:Both Nares     nystatin  (MYCOSTATIN ) 100000 UNIT/ML suspension      OLANZapine  (ZYPREXA ) 10 MG tablet Take 1 tablet (10 mg total) by mouth at bedtime. 30 tablet 2   omeprazole  (PRILOSEC) 40 MG capsule Take 1 capsule (40 mg total) by mouth 2 (two) times daily. 180 capsule 3    ondansetron  (ZOFRAN ) 8 MG tablet TAKE 1 TABLET BY MOUTH TWICE DAILY 60 tablet 1   permethrin  (ELIMITE ) 5 % cream      potassium chloride  SA (KLOR-CON  M) 20 MEQ tablet TAKE 1 TABLET BY MOUTH TWICE DAILY 60 tablet 2   prochlorperazine  (COMPAZINE ) 10 MG tablet Take 1 tablet (10 mg total) by mouth every 6 (six) hours as needed for nausea or vomiting. 30 tablet 0   Rimegepant Sulfate (NURTEC) 75 MG TBDP TAKE 1 TABLET BY MOUTH DAILY AS NEEDED FOR MIGRAINE, take WITHIN 15 MINUTES of initial symptoms 8 tablet 1   topiramate  (TOPAMAX ) 100 MG tablet Take 1 tablet (100 mg total) by mouth at bedtime. 90 tablet 1   triamcinolone  ointment (KENALOG ) 0.5 % Apply 1 Application topically 3 (three) times daily. Apply thin-layer on affected areas, no more than 2 weeks consistently. 60 g 2   triamterene -hydrochlorothiazide  (MAXZIDE -25) 37.5-25 MG tablet Take 1 tablet by mouth daily.     umeclidinium-vilanterol (ANORO ELLIPTA ) 62.5-25 MCG/ACT AEPB Inhale 1 puff into the lungs daily. 60 each 11   lidocaine -prilocaine  (EMLA ) cream Apply 1 Application topically as needed. (Patient not taking: Reported on 12/30/2023) 30 g 2   No facility-administered medications prior to visit.   Review of Systems  Constitutional:  Negative for chills, diaphoresis, fever, malaise/fatigue and weight loss.  HENT:  Positive for congestion.   Respiratory:  Positive for cough. Negative for hemoptysis, sputum production, shortness of breath and wheezing.   Cardiovascular:  Negative for chest pain, palpitations and leg swelling.    BP 126/68 (BP Location: Left Arm, Patient Position: Sitting, Cuff Size: Large)   Pulse 88   Temp 98.1 F (36.7 C)   Ht 5' 2 (1.575 m)   Wt 189 lb (85.7 kg)   SpO2 98%   BMI 34.57 kg/m   Physical Exam: General: Well-appearing, no acute distress HENT: [place], AT Eyes: EOMI, no scleral icterus Respiratory: Clear to auscultation bilaterally.  No crackles, wheezing or rales Cardiovascular: RRR, -M/R/G, no  JVD Extremities:-Edema,-tenderness Neuro: AAO x4, CNII-XII grossly intact  Psych: Normal mood, normal affect  Data Reviewed:  PFTs: 05/26/17 FVC 2.38 (72%) FEV1 1.6 (63%) Ratio 63 TLC 105% RV 141% DLCO 55% Interpretation: Moderate obstructive defect with significant bronchodilator response in FEV1. With presence and airtrapping and reduced gas exchange, findings are consistent with emphysema.  Labs: - Allergy  profile 03/18/17 >  Eos 0.3 /  IgE  4 neg RAST  Alpha-1 December 05, 2017 normal, MM, 174  Chest imaging: HRCT chest 12/24/2017-no ILD, mild to moderate emphysema CT A/P 07/05/20- right infrahilar enlargement vs overlaying right lower lobe lung nodule. Moderate stool in colon. CT Angio Chest Aort 07/26/20 Macrolobulated RLL nodule vs enlarged right infrahilar lymph node with punctate satellite nodule, paraseptal emphysema CT Coronary 09/21/20 - Macrolobulated RLLL 2.1 x 1.5 cm lung nodule with adjacent 4mm nodule PET 10/13/20 - Hypermetabolic 2cm RLL lesion of the lung and 17mm soft tissue between descending duodenum and pancreatic head, emphysema MR Brain 11/04/20 - No metastatic disease CT CAP 01/17/21 - Resolution of RLL mass. Mild residual soft tissue of pancreaticoduodenol groove. Close follow-up recommended CT CAP 01/22/22 - No evidence of small cell lung cancer recurrence CT CAP 07/26/22 - No evidence of small cell lung cancer recurrence, sigmoid diverticulosis, mildly coarse contour of liver suggestive of cirrhosis, emphysema CT CAP 01/06/23 - No evidence of small cell lung cancer recurrence CXR 05/22/23 - No acute cardiopulmonary issues CT CAP 10/15/23 - No evidence of recurrence of lung cancer. Lateral left lobe of liver with questionable nodularity  Assessment & Plan:  68 year old female active smoker with COPD/emphysema, hx nocturnal hypoxemia and extensive small cell carcinoma with no recurrence since 03/2021 who presents for COPD follow-up. Unable to tolerate Trelegy due to thrush.  Well controlled on Anoro.Discussed clinical course and management of COPD including bronchodilator regimen, preventive care including vaccinations and action plan for exacerbation.   COPD  --CONTINUE Anoro ONE puff ONCE a day --CONTINUE Albuterol  AS NEEDED shortness of breath or wheezing.  --Uptodate on influenza and pneumococcal. Recommend RSV. Decline COVID   History nocturnal hypoxemia  --Previously on 3L nightly but self-discontinued due to choking from tubing --Counseled on risks of untreated nocturnal hypoxemia --Declined overnight oximetry testing  Tobacco abuse Patient is an active smoker. Not ready to quit. Currently 2 ppd --Declined cessation  Small cell carcinoma of the lung, stage IVa --Per 03/2021 Oncology note, excellent response to therapy --On maintenance --CT CAP 09/2023 reviewed with no recurrence  Health Maintenance Immunization History  Administered Date(s) Administered   Influenza,inj,Quad PF,6+ Mos 10/02/2018, 11/30/2019, 11/15/2020   Influenza-Unspecified 01/16/2011, 11/26/2011, 12/22/2012, 11/17/2018   PFIZER Comirnaty(Gray Top)Covid-19 Tri-Sucrose Vaccine 05/17/2020   PFIZER(Purple Top)SARS-COV-2 Vaccination 10/20/2019, 11/10/2019   PNEUMOCOCCAL CONJUGATE-20 09/04/2020   Td 06/07/2005   CT Lung Screen - not qualified  No orders of the defined types were placed in this encounter.  No orders of the defined types were placed in this encounter.  Return in about 3 months (around 03/29/2024).  I have spent a total time of 30-minutes on the day of the appointment including chart review, data review, collecting history, coordinating care and discussing medical diagnosis and plan with the patient/family. Past medical history, allergies, medications were reviewed. Pertinent imaging, labs and tests included in this note have been reviewed and interpreted independently by me.  Kit Brubacher Slater Staff, MD 12/30/2023

## 2023-12-30 NOTE — Patient Instructions (Signed)
 COPD  --CONTINUE Anoro ONE puff ONCE a day --CONTINUE Albuterol  AS NEEDED shortness of breath or wheezing.  --Uptodate on influenza and pneumococcal. Recommend RSV. Decline COVID

## 2024-01-07 ENCOUNTER — Other Ambulatory Visit: Payer: Self-pay | Admitting: Physician Assistant

## 2024-01-09 ENCOUNTER — Ambulatory Visit (HOSPITAL_COMMUNITY): Admission: RE | Admit: 2024-01-09 | Discharge: 2024-01-09 | Attending: Hematology and Oncology

## 2024-01-09 DIAGNOSIS — C3431 Malignant neoplasm of lower lobe, right bronchus or lung: Secondary | ICD-10-CM

## 2024-01-09 MED ORDER — IOHEXOL 300 MG/ML  SOLN
100.0000 mL | Freq: Once | INTRAMUSCULAR | Status: AC | PRN
  Administered 2024-01-09: 100 mL via INTRAVENOUS

## 2024-01-13 NOTE — Progress Notes (Unsigned)
 [facility] [phone]   [phone]  PROGRESS NOTE  Patient Care Team: [name], FNP as PCP - General (Family Medicine) O'Neal, Darryle Ned, MD as PCP - Cardiology (Cardiology) [name], MD (Inactive) as Attending Physician (Gastroenterology) [name], MD as Consulting Physician (Orthopedic Surgery) [name], MD as Consulting Physician (Orthopedic Surgery) [name], MD (Inactive) as Consulting Physician (Neurology) [name], MD as Consulting Physician (Neurology) [name], [facility] (Inactive) (Pharmacist) [facility], [facility]    Hematological/Oncological History # Small Cell Lung Cancer, Extensive Stage 07/05/2020: CT abdomen for lower abdominal pain. New pulmonary nodular density noted 07/06/2020: CT chest showed 2.2 cm macrolobulated right lower lobe pulmonary nodule (favored) versus pathologically enlarged infrahilar lymph node 10/13/2020: PET CT scan performed, findings show 2 cm right lower lobe lung mass is hypermetabolic and consistent with primary lung neoplasm. Additionally found hypermetabolic 17 mm soft tissue lesion between the descending duodenum and the pancreatic head  10/19/2020: EGD to biopsy hypermetabolic lymph node. Biopsy results show small cell lung cancer 10/26/2020: establish care with Dr. Federico  11/13/2020: Cycle 1 Day 1 of Carbo/Etop/Atezolizumab  12/04/2020: Cycle 2 Day 1 of Carbo/Etop/Atezolizumab  11/22-11/25/2022: admitted for E. Coli bacteremia/sepsis. Start of Cycle 3 delayed. 01/02/2021: Cycle 3 Day 1 of Carbo/Etop/Atezolizumab  01/23/2021: Cycle 4 Day 1 of Carbo/Etop/Atezolizumab  02/22/2021: Cycle 5 Day 1 of Atezolizumab  Maintenance. Delayed start due to patient's COVID infection.  03/14/2021: Cycle 6 Day 1 of Atezolizumab  Maintenance 04/05/2021: Cycle 7 Day 1 of Atezolizumab  Maintenance 04/25/2021: Cycle 8 Day 1 of Atezolizumab   Maintenance 05/16/2021: Cycle 9 Day 1 of Atezolizumab  Maintenance 06/06/2021: Cycle 10 Day 1 of Atezolizumab  Maintenance 06/27/2021:  Cycle 11 Day 1 of Atezolizumab  Maintenance 07/18/2021: Cycle 12 Day 1 of Atezolizumab  Maintenance 08/08/2021: Cycle 13 Day 1 of Atezolizumab  Maintenance 08/29/2021: Cycle 14 Day 1 of Atezolizumab  Maintenance 09/19/2021: Cycle 15 Day 1 of Atezolizumab  Maintenance 10/10/2021: Cycle 16 Day 1 of Atezolizumab  Maintenance 10/31/2021: Cycle 17 Day 1 of Atezolizumab  Maintenance 11/21/2021: Cycle 18 Day 1 of Atezolizumab  Maintenance 12/12/2021: Cycle 19 Day 1 of Atezolizumab  Maintenance 01/02/2022: Cycle 20 Day 1 of Atezolizumab  Maintenance 01/23/2022: Cycle 21 Day 1 of Atezolizumab  Maintenance 02/13/2022: treatment HELD due to diarrhea.  03/06/2022: Cycle 22 Day 1 of Atezolizumab  Maintenance 03/27/2022: Cycle 23 Day 1 of Atezolizumab  Maintenance 04/17/2022: Cycle 24 Day 1 of Atezolizumab  Maintenance 05/08/2022:  Cycle 25 Day 1 of Atezolizumab  Maintenance (Held due to patient preference for treatment holiday) 05/29/2022: Cycle 25 Day 1 of Atezolizumab  Maintenance  06/19/2022: Cycle 26 Day 1 of Atezolizumab  Maintenance  07/12/2022: Cycle 27 Day 1 of Atezolizumab  Maintenance 07/31/2022: Cycle 28 Day 1 of Atezolizumab  Maintenance HELD per patient request for fatigue.  08/22/2022: Cycle 28 Day 1 of Atezolizumab  Maintenance 09/11/2022: Cycle 29 Day 1 of Atezolizumab  Maintenance 10/03/2022: Cycle 30 Day 1 of Atezolizumab  Maintenance 10/23/2022: Cycle 31 Day 1 of Atezolizumab  Maintenance 11/13/2022: Cycle 32 Day 1 of Atezolizumab  Maintenance 12/24/2022: Cycle 33 Day 1 of Atezolizumab  Maintenance 01/15/2023: Cycle 34 Day 1 of Atezolizumab  Maintenance 02/05/2023: Cycle 35 Day 1 of Atezolizumab  Maintenance 02/26/2023: Cycle 36 Day 1 of Atezolizumab  Maintenance 04/28/2023: Cycle 37 Day 1 of Atezolizumab  Maintenance 04/30/2023: Cycle 38 Day 1 of Atezolizumab  Maintenance 05/21/2023: Cycle 39 Day 1 of  Atezolizumab  Maintenance 06/11/2023: Cycle 40 Day 1 of Atezolizumab  Maintenance 07/28/2023: Cycle 41 Day 1 of Atezolizumab  Maintenance 08/18/2023: Cycle 42 Day 1 of Atezolizumab  Maintenance 09/08/2023: Cycle 43 Day 1 of Atezolizumab  Maintenance 09/30/2023:  Cycle  44 Day 1 of Atezolizumab  Maintenance 10/21/2023: Cycle 45 Day 1 of Atezolizumab  Maintenance 11/11/2023: Cycle 46 Day 1 of Atezolizumab  Maintenance 12/03/2023: Cycle 47 Day 1 of Atezolizumab  Maintenance  Interval History:  [name] 68 y.o. female with medical history significant for extensive stage small cell lung cancer who presents for a follow up visit. The patient's last visit was on 12/24/2023.  She presents today to start cycle 49 of maintenance atezolizumab . She is unaccompanied for this visit.   On exam today [name] reports she has been well overall in the room since her last visit.  She reports that her energy level is stuck in reverse.  She also reports that she has no appetite and forces her self to eat.  She reports her stomach remains a mess.  She does continue to have some back aches but fortunately staying away from viral illnesses.  She has received her flu and RSV shots.  She has had no issues with fevers, chills, sweats, nausea, vomiting or diarrhea.  Overall she is tolerating the immunotherapy well with the exception of her fatigue.  She is willing and able to proceed with treatment at this time.  Full 10 point ROS is otherwise negative.  MEDICAL HISTORY:  Past Medical History:  Diagnosis Date   Allergic rhinitis    Anemia    Anxiety    Chicken pox    Chronic back pain    COPD (chronic obstructive pulmonary disease) (HCC)    Depression    DM (diabetes mellitus) (HCC)    Essential hypertension    GERD (gastroesophageal reflux disease)    Headache    migraines   History of gastritis    EGD 2015   History of home oxygen  therapy    2 liters at hs last 6 months   Hyperlipidemia    Hypothyroidism     Migraines    Osteoarthritis    oa   Scoliosis    Small cell lung cancer (HCC)    Stage 4    SURGICAL HISTORY: Past Surgical History:  Procedure Laterality Date   APPENDECTOMY     1985   BIOPSY  07/24/2017   Procedure: BIOPSY;  Surgeon: [name], MD;  Location: [facility];  Service: Endoscopy;;   CARDIAC CATHETERIZATION N/A 10/31/2015   Procedure: Left Heart Cath and Coronary Angiography;  Surgeon: Alm LELON Clay, MD;  Location: [facility] INVASIVE CV LAB;  Service: Cardiovascular;  Laterality: N/A;   CARPAL TUNNEL RELEASE Left    CARPAL TUNNEL RELEASE Right    CATARACT EXTRACTION W/PHACO Left 02/07/2023   Procedure: CATARACT EXTRACTION PHACO AND INTRAOCULAR LENS PLACEMENT (IOC);  Surgeon: Juli Blunt, MD;  Location: [facility];  Service: Ophthalmology;  Laterality: Left;  CDE: 3.51   CATARACT EXTRACTION W/PHACO Right 02/21/2023   Procedure: CATARACT EXTRACTION PHACO AND INTRAOCULAR LENS PLACEMENT (IOC);  Surgeon: Juli Blunt, MD;  Location: [facility];  Service: Ophthalmology;  Laterality: Right;  CDE: 2.94   CHOLECYSTECTOMY  late 1980's   COLONOSCOPY WITH PROPOFOL  N/A 07/24/2017   Procedure: COLONOSCOPY WITH PROPOFOL ;  Surgeon: [name], MD;  Location: [facility];  Service: Endoscopy;  Laterality: N/A;   ESOPHAGOGASTRODUODENOSCOPY N/A 07/24/2017   Procedure: ESOPHAGOGASTRODUODENOSCOPY (EGD);  Surgeon: [name], MD;  Location: [facility];  Service: Endoscopy;  Laterality: N/A;   ESOPHAGOGASTRODUODENOSCOPY (EGD) WITH PROPOFOL  N/A 10/19/2020   Procedure: ESOPHAGOGASTRODUODENOSCOPY (EGD) WITH PROPOFOL ;  Surgeon: [name], MD;  Location: [facility];  Service: Endoscopy;  Laterality: N/A;  FINE NEEDLE ASPIRATION N/A 10/19/2020   Procedure: FINE NEEDLE ASPIRATION (FNA) LINEAR;  Surgeon: [name], MD;  Location: [facility];  Service: Endoscopy;  Laterality: N/A;   GALLBLADDER SURGERY  1991   HIP CLOSED REDUCTION Right 01/08/2016   Procedure: CLOSED  MANIPULATION HIP;  Surgeon: Reyes Billing, MD;  Location: [facility];  Service: Orthopedics;  Laterality: Right;   HIP CLOSED REDUCTION Right 01/19/2016   Procedure: ATTEMPTED CLOSED REDUCTION RIGHT HIP;  Surgeon: Norleen Armor, MD;  Location: [facility];  Service: Orthopedics;  Laterality: Right;   HIP CLOSED REDUCTION Right 01/20/2016   Procedure: CLOSED REDUCTION RIGHT TOTAL HIP;  Surgeon: Donnice Car, MD;  Location: [facility];  Service: Orthopedics;  Laterality: Right;   HIP CLOSED REDUCTION Right 02/17/2016   Procedure: CLOSED REDUCTION RIGHT TOTAL HIP;  Surgeon: Redell Shoals, MD;  Location: [facility];  Service: Orthopedics;  Laterality: Right;   HIP CLOSED REDUCTION Right 02/28/2016   Procedure: CLOSED REDUCTION HIP;  Surgeon: Selinda Belvie Gosling, MD;  Location: [facility];  Service: Orthopedics;  Laterality: Right;   IR IMAGING GUIDED PORT INSERTION  11/01/2020   POLYPECTOMY  07/24/2017   Procedure: POLYPECTOMY;  Surgeon: [name], MD;  Location: [facility];  Service: Endoscopy;;   TONSILLECTOMY     TOTAL ABDOMINAL HYSTERECTOMY     1985, with 1 ovary removed and 2 nd ovary removed 2003   TOTAL HIP ARTHROPLASTY Right    Original surgery 2006 with revision 2010   TOTAL HIP REVISION Right 01/01/2016   Procedure: TOTAL HIP REVISION;  Surgeon: Donnice Car, MD;  Location: [facility];  Service: Orthopedics;  Laterality: Right;   TOTAL HIP REVISION Right 03/02/2016   Procedure: TOTAL HIP REVISION;  Surgeon: Car Donnice, MD;  Location: [facility];  Service: Orthopedics;  Laterality: Right;   TOTAL HIP REVISION Right 09/02/2016   Procedure: Right hip constrained liner- posterior;  Surgeon: Car Donnice, MD;  Location: [facility];  Service: Orthopedics;  Laterality: Right;   ULNAR NERVE TRANSPOSITION Right    UPPER ESOPHAGEAL ENDOSCOPIC ULTRASOUND (EUS) N/A 10/19/2020   Procedure: UPPER ESOPHAGEAL ENDOSCOPIC ULTRASOUND (EUS);  Surgeon: [name], MD;  Location: [facility];  Service: Endoscopy;  Laterality: N/A;   periduodenal lesion    SOCIAL HISTORY: Social History   Socioeconomic History   Marital status: Married    Spouse name: Not on file   Number of children: 2   Years of education: Not on file   Highest education level: 12th grade  Occupational History   Occupation: disabled   Occupation: disabled  Tobacco Use   Smoking status: Every Day    Current packs/day: 2.00    Average packs/day: 2.0 packs/day for 46.0 years (92.0 ttl pk-yrs)    Types: Cigarettes   Smokeless tobacco: Never   Tobacco comments:    2 packs of cigarettes smoked daily 12/13/21- declines smoking cessation  Vaping Use   Vaping status: Never Used  Substance and Sexual Activity   Alcohol use: No   Drug use: No   Sexual activity: Not Currently    Partners: Male  Other Topics Concern   Not on file  Social History Narrative   Right handed    Caffeine~ 2 cups per day    Lives at home with husband (strained relationship)   Primary caretaker for disabled brother who had aneurism   Daughter died 07/13/18   Social Drivers of Health   Tobacco Use: High Risk (12/30/2023)   Patient History  Smoking Tobacco Use: Every Day    Smokeless Tobacco Use: Never    Passive Exposure: Not on file  Financial Resource Strain: Low Risk (07/31/2023)   [facility]   Overall Financial Resource Strain (CARDIA)    How hard is it for you to pay for the very basics like food, housing, medical care, and heating?: Not very hard  Food Insecurity: No Food Insecurity (07/31/2023)   Received from [facility]   Epic    Within the past 12 months, you worried that your food would run out before you got the money to buy more.: Never true    Within the past 12 months, the food you bought just didn't last and you didn't have money to get more.: Never true  Recent Concern: Food Insecurity - Food Insecurity Present (05/20/2023)   Hunger Vital Sign    Worried About Running Out of Food in the Last Year: Sometimes true    Ran  Out of Food in the Last Year: Sometimes true  Transportation Needs: No Transportation Needs (07/31/2023)   [facility]   PRAPARE - Transportation    Lack of Transportation (Medical): No    Lack of Transportation (Non-Medical): No  Physical Activity: Inactive (05/20/2023)   Exercise Vital Sign    Days of Exercise per Week: 0 days    Minutes of Exercise per Session: 0 min  Stress: No Stress Concern Present (05/20/2023)   [employer] of Occupational [employer] - Occupational Stress Questionnaire    Feeling of Stress : Only a little  Recent Concern: Stress - Stress Concern Present (03/31/2023)   [employer] of Occupational Health - Occupational Stress Questionnaire    Feeling of Stress : Very much  Social Connections: Moderately Integrated (05/20/2023)   Social Connection and Isolation Panel    Frequency of Communication with Friends and Family: More than three times a week    Frequency of Social Gatherings with Friends and Family: More than three times a week    Attends Religious Services: 1 to 4 times per year    Active Member of [employer] or Organizations: No    Attends Banker Meetings: Never    Marital Status: Married  Catering Manager Violence: Not At Risk (07/31/2023)   Received from [facility]   Epic    Within the last year, have you been afraid of your partner or ex-partner?: No    Within the last year, have you been humiliated or emotionally abused in other ways by your partner or ex-partner?: No    Within the last year, have you been kicked, hit, slapped, or otherwise physically hurt by your partner or ex-partner?: No    Within the last year, have you been raped or forced to have any kind of sexual activity by your partner or ex-partner?: No  Depression (PHQ2-9): Medium Risk (12/24/2023)   Depression (PHQ2-9)    PHQ-2 Score: 5  Alcohol Screen: Low Risk (05/20/2023)   Alcohol Screen    Last Alcohol Screening Score (AUDIT): 0  Housing: Low Risk  (05/20/2023)   Housing Stability Vital Sign    Unable to Pay for Housing in the Last Year: No    Number of Times Moved in the Last Year: 0    Homeless in the Last Year: No  Utilities: Low Risk (07/31/2023)   [facility]   Utilities    Within the past 12 months, have you been unable to get  utilities(heat, electricity) when it was really needed?: No  Health Literacy: Adequate Health Literacy (05/20/2023)   B1300 Health Literacy    Frequency of need for help with medical instructions: Never    FAMILY HISTORY: Family History  Problem Relation Age of Onset   COPD Mother    Heart disease Mother    Lung disease Father        Asbestosis   Heart attack Father    Heart disease Father    Cerebral aneurysm Brother    Aneurysm Brother        Brain   Drug abuse Daughter    Epilepsy Son    Alcohol abuse Son    Drug abuse Son    Arthritis Maternal Grandmother    Heart disease Maternal Grandmother    Asthma Maternal Grandfather    Cancer Maternal Grandfather    Arthritis Paternal Grandmother    Heart disease Paternal Grandmother    Stroke Paternal Grandmother    Early death Paternal Grandfather    Heart disease Paternal Grandfather     ALLERGIES:  is allergic to nisoldipine, metformin  and related, nsaids, pregabalin, wellbutrin  [bupropion ], aleve [naproxen sodium], penicillins, and sulfonamide derivatives.  MEDICATIONS:  Current Outpatient Medications  Medication Sig Dispense Refill   albuterol  (PROAIR  HFA) 108 (90 Base) MCG/ACT inhaler 2 puffs every 4 hours as needed only  if your can't catch your breath 18 g 5   ALPRAZolam  (XANAX ) 1 MG tablet TAKE 1 TABLET BY MOUTH THREE TIMES DAILY AS NEEDED FOR ANXIETY 90 tablet 2   ARIPiprazole  (ABILIFY ) 5 MG tablet Take 1 tablet (5 mg total) by mouth daily. 30 tablet 5   atorvastatin  (LIPITOR) 20 MG tablet TAKE 1 TABLET BY MOUTH AT BEDTIME 90 tablet 1   benzonatate  (TESSALON ) 100 MG capsule Take 1 capsule (100 mg total) by mouth 3  (three) times daily. 90 capsule 1   calcipotriene  (DOVONOX) 0.005 % cream Apply to affected area bid when on break from steroid cream 120 g 3   ciprofloxacin  (CIPRO ) 250 MG tablet Take 1 tablet (250 mg total) by mouth 2 (two) times daily. (Patient not taking: Reported on 01/14/2024) 10 tablet 0   cyclobenzaprine  (FLEXERIL ) 10 MG tablet Take 10 mg by mouth 3 (three) times daily as needed.     dicyclomine  (BENTYL ) 10 MG capsule TAKE 1 CAPSULE BY MOUTH EVERY 8 HOURS AS NEEDED FOR SPASMS 60 capsule 1   diphenoxylate -atropine  (LOMOTIL ) 2.5-0.025 MG tablet TAKE 1 TABLET BY MOUTH FOUR TIMES DAILY AS NEEDED FOR diarrhea OR loose stools 60 tablet 0   DULoxetine  (CYMBALTA ) 60 MG capsule TAKE 2 CAPSULES BY MOUTH EVERY DAY 180 capsule 1   ferrous sulfate  325 (65 FE) MG EC tablet TAKE 1 TABLET BY MOUTH DAILY WITH BREAKFAST 30 tablet 3   fluconazole  (DIFLUCAN ) 150 MG tablet Take 150 mg by mouth once. (Patient not taking: Reported on 01/14/2024)     fluticasone  (FLONASE ) 50 MCG/ACT nasal spray INSTILL 2 SPRAYS IN EACH NOSTRIL EVERY DAY 16 g 6   furosemide  (LASIX ) 20 MG tablet TAKE 1 TABLET BY MOUTH TWICE DAILY, IF SWELLING IMPROVES CAN HOLD MEDICATION 90 tablet 0   gabapentin (NEURONTIN) 100 MG capsule Take 100 mg by mouth 2 (two) times daily.     HYDROcodone -acetaminophen  (NORCO) 10-325 MG tablet Take 1 tablet by mouth every 4 (four) hours as needed. (Patient not taking: Reported on 01/14/2024) 180 tablet 0   HYDROmorphone  (DILAUDID ) 4 MG tablet Take 1 tablet (4 mg total) by mouth  every 6 (six) hours as needed for severe pain (pain score 7-10). Increased dose because 2 mg wasn't working well enough- [name] send in another supply so has enough pills- G89.2- chronic pain due to cancer 120 tablet 0   hydrOXYzine  (ATARAX ) 10 MG tablet Take 1 tablet (10 mg total) by mouth every 6 (six) hours as needed. 30 tablet 2   levothyroxine  (SYNTHROID ) 137 MCG tablet Take 1 tablet (137 mcg total) by mouth daily before breakfast. 90  tablet 3   lidocaine -prilocaine  (EMLA ) cream Apply 1 Application topically as needed. (Patient not taking: Reported on 12/30/2023) 30 g 2   loratadine  (CLARITIN ) 10 MG tablet Take 10 mg by mouth daily.     meclizine  (ANTIVERT ) 12.5 MG tablet Take 1 tablet (12.5 mg total) by mouth 2 (two) times daily. 60 tablet 2   meloxicam  (MOBIC ) 15 MG tablet TAKE 1 TABLET BY MOUTH EVERY DAY 30 tablet 0   NARCAN  4 MG/0.1ML LIQD nasal spray kit SMARTSIG:Both Nares     nystatin  (MYCOSTATIN ) 100000 UNIT/ML suspension      OLANZapine  (ZYPREXA ) 10 MG tablet Take 1 tablet (10 mg total) by mouth at bedtime. 30 tablet 2   omeprazole  (PRILOSEC) 40 MG capsule Take 1 capsule (40 mg total) by mouth 2 (two) times daily. 180 capsule 3   ondansetron  (ZOFRAN ) 8 MG tablet TAKE 1 TABLET BY MOUTH TWICE DAILY 60 tablet 1   permethrin  (ELIMITE ) 5 % cream      potassium chloride  SA (KLOR-CON  M) 20 MEQ tablet TAKE 1 TABLET BY MOUTH TWICE DAILY 60 tablet 2   prochlorperazine  (COMPAZINE ) 10 MG tablet Take 1 tablet (10 mg total) by mouth every 6 (six) hours as needed for nausea or vomiting. 30 tablet 0   Rimegepant Sulfate (NURTEC) 75 MG TBDP TAKE 1 TABLET BY MOUTH DAILY AS NEEDED FOR MIGRAINE, take WITHIN 15 MINUTES of initial symptoms 8 tablet 1   topiramate  (TOPAMAX ) 100 MG tablet Take 1 tablet (100 mg total) by mouth at bedtime. 90 tablet 1   triamcinolone  ointment (KENALOG ) 0.5 % Apply 1 Application topically 3 (three) times daily. Apply thin-layer on affected areas, no more than 2 weeks consistently. 60 g 2   triamterene -hydrochlorothiazide  (MAXZIDE -25) 37.5-25 MG tablet Take 1 tablet by mouth daily.     umeclidinium-vilanterol (ANORO ELLIPTA ) 62.5-25 MCG/ACT AEPB Inhale 1 puff into the lungs daily. 60 each 11   No current facility-administered medications for this visit.    REVIEW OF SYSTEMS:   Constitutional: ( - ) fevers, ( - )  chills , ( - ) night sweats Eyes: ( - ) blurriness of vision, ( - ) double vision, ( - ) watery  eyes Ears, nose, mouth, throat, and face: ( - ) mucositis, ( - ) sore throat Respiratory: ( - ) cough, ( -) dyspnea, ( - ) wheezes Cardiovascular: ( - ) palpitation, ( - ) chest discomfort, ( + ) lower extremity swelling Gastrointestinal:  ( -) nausea, ( - ) heartburn, ( - ) change in bowel habits Skin: ( - ) abnormal skin rashes Lymphatics: ( - ) new lymphadenopathy, ( - ) easy bruising Neurological: (+ ) numbness, ( - ) tingling, ( - ) new weaknesses Behavioral/Psych: ( - ) mood change, ( - ) new changes  All other systems were reviewed with the patient and are negative.  PHYSICAL EXAMINATION: ECOG PERFORMANCE STATUS: 1 - Symptomatic but completely ambulatory  Vitals:   01/14/24 1052  BP: 137/81  Pulse: (!) 101  Resp:  17  Temp: (!) 97.1 F (36.2 C)  SpO2: 96%   Filed Weights   01/14/24 1052  Weight: 188 lb 11.2 oz (85.6 kg)     GENERAL: Well-appearing middle-age Caucasian female, alert, no distress and comfortable SKIN: skin color, texture, turgor are normal, no rashes or significant lesions EYES: conjunctiva are pink and non-injected, sclera clear LUNGS:  normal breathing effort. Diffuse wheezing hear on ausculation.  HEART: regular rate & rhythm and no murmurs.  Musculoskeletal: no cyanosis of digits and no clubbing  PSYCH: alert & oriented x 3, fluent speech NEURO: no focal motor/sensory deficits  LABORATORY DATA:  I have reviewed the data as listed    Latest Ref Rng & Units 01/14/2024   10:16 AM 12/24/2023   10:37 AM 12/03/2023   10:15 AM  CBC  WBC 4.0 - 10.5 K/uL 8.6  8.0  7.3   Hemoglobin 12.0 - 15.0 g/dL 86.0  86.4  85.8   Hematocrit 36.0 - 46.0 % 42.5  41.0  42.6   Platelets 150 - 400 K/uL 178  147  151        Latest Ref Rng & Units 01/14/2024   10:16 AM 12/24/2023   10:37 AM 12/03/2023   10:15 AM  CMP  Glucose 70 - 99 mg/dL 899  87  95   BUN 8 - 23 mg/dL 18  14  13    Creatinine 0.44 - 1.00 mg/dL 8.67  8.81  8.91   Sodium 135 - 145 mmol/L 138  140   140   Potassium 3.5 - 5.1 mmol/L 3.5  4.0  3.6   Chloride 98 - 111 mmol/L 97  103  107   CO2 22 - 32 mmol/L 31  29  28    Calcium  8.9 - 10.3 mg/dL 8.8  8.3  8.3   Total Protein 6.5 - 8.1 g/dL 6.7  6.1  5.9   Total Bilirubin 0.0 - 1.2 mg/dL 0.4  0.3  0.3   Alkaline Phos 38 - 126 U/L 106  87  72   AST 15 - 41 U/L 20  19  14    ALT 0 - 44 U/L 11  13  8      No results found for: MPROTEIN Lab Results  Component Value Date   KPAFRELGTCHN 0.75 06/30/2014   LAMBDASER 3.78 (H) 06/30/2014   KAPLAMBRATIO 0.20 (L) 06/30/2014     RADIOGRAPHIC STUDIES: CT CHEST ABDOMEN PELVIS W CONTRAST Result Date: 01/14/2024 CLINICAL DATA:  Restaging small cell lung cancer. Patient currently undergoing chemotherapy. * Tracking Code: BO * EXAM: CT CHEST, ABDOMEN, AND PELVIS WITH CONTRAST TECHNIQUE: Multidetector CT imaging of the chest, abdomen and pelvis was performed following the standard protocol during bolus administration of intravenous contrast. RADIATION DOSE REDUCTION: This exam was performed according to the departmental dose-optimization program which includes automated exposure control, adjustment of the mA and/or kV according to patient size and/or use of iterative reconstruction technique. CONTRAST:  100mL OMNIPAQUE  IOHEXOL  300 MG/ML  SOLN COMPARISON:  Multiple prior imaging studies. Most recent CT scan is 10/15/2023. FINDINGS: CT CHEST FINDINGS Cardiovascular: The heart is normal in size. No pericardial effusion. The aorta is normal in caliber. No dissection. Stable atherosclerotic calcifications. Stable branch vessel calcifications including three-vessel coronary artery calcifications. The pulmonary arteries appear normal. Mediastinum/Nodes: Small scattered mediastinal and hilar lymph nodes are stable. No new progressive findings. The esophagus is unremarkable. Lungs/Pleura: Stable underlying emphysematous changes and areas of pulmonary scarring. No acute pulmonary findings. No worrisome pulmonary lesions  or  nodules. No recurrent right lower lobe pulmonary mass. No pleural effusions or pleural lesions. Musculoskeletal: No breast masses, supraclavicular or axillary adenopathy. The right IJ Port-A-Cath stable the bony thorax is intact. No bone lesions fractures. CT ABDOMEN PELVIS FINDINGS Hepatobiliary: No focal liver abnormality is seen. Status post cholecystectomy. Stable moderate common bile duct dilatation. Pancreas: Unremarkable. No pancreatic ductal dilatation or surrounding inflammatory changes. Spleen: Normal in size without focal abnormality. Adrenals/Urinary Tract: Stable 10 mm left adrenal gland nodule consistent with a benign adenoma. The right adrenal gland is normal. No worrisome renal lesions. Stable hemorrhagic or proteinaceous cyst involving the midpole region of the right kidney. The bladder is unremarkable. Stomach/Bowel: The stomach, duodenum, small bowel and colon are grossly normal without oral contrast. No inflammatory changes, mass lesions or obstructive findings. The appendix is surgically absent. Large amount of stool throughout the and down into the rectum suggesting chronic constipation. Vascular/Lymphatic: Stable significant age advanced atherosclerotic calcifications involving the aorta and iliac arteries but no aneurysm, dissection or occlusion. The major venous structures are patent. No mesenteric retroperitoneal mass or adenopathy. Small scattered lymph nodes are stable. Reproductive: Surgically absent. Other: No pelvic mass or adenopathy. No free pelvic fluid collections. No inguinal mass or adenopathy. No abdominal wall hernia or subcutaneous lesions. Musculoskeletal: No findings suspicious for osseous metastatic disease. Stable degenerative changes involving the spine and left hip. Total right hip arthroplasty again noted. IMPRESSION: 1. Stable CT appearance of the chest, abdomen and pelvis. No findings for recurrent tumor, adenopathy or metastatic disease. 2. Stable 10 mm left  adrenal gland nodule consistent with a benign adenoma. 3. Stable significant age advanced atherosclerotic calcifications involving the thoracic and abdominal aorta and branch vessels including three-vessel coronary artery calcifications. 4. Large amount of stool throughout the colon and down into the rectum suggesting chronic constipation. Aortic Atherosclerosis (ICD10-I70.0) and Emphysema (ICD10-J43.9). Electronically Signed   By: MYRTIS Stammer M.D.   On: 01/14/2024 11:48   ASSESSMENT & PLAN [name] is a 68 y.o. female with medical history significant for extensive stage small cell lung cancer who presents for a follow up visit.   # Small Cell Lung Cancer, Extensive Stage -- MRI of the brain shows no evidence of intracranial spread --Findings are currently consistent with metastatic small cell lung cancer with metastatic spread to the lymph nodes of the abdomen -- Started carboplatin , etoposide , and atezolizumab  on 11/13/2020. Transitioned to maintenance Atezolizumab  on 02/22/2021.  Plan: --today is Cycle 49 Day 1 of Atezolizumab  maintenance.  --labs today were reviewed and adequate for treatment.  WBC 8.6, hemoglobin 13.9, MCV 90.8, platelets 178, creatinine overall stable at 1.32, LFTs in range. -- Most recent CT CAP from 10/15/2023 which shows no evidence of recurrence. Repeat scan due in December 2025.  -- No prohibitive toxicities identified.  Proceed with treatment without any dose modifications.  --RTC in 3 weeks for a follow up before Cycle 50  # Early Cirrhosis --noted on MR abdomen from 05/22/2022. US  with elastography performed on 06/18/2022. --Most recent CT scan from 10/15/2023 showed slight enlargement of lateral left lobe of liver with questionable contour nodularity. Dr. Leigh reports repeat RUQ US  in 6 months (March 2026).  #Right renal lesion: --Noted to be benign on most recent MRI of the abdomen.  #Diarrhea -- Previously sent stool studies for C. Diff and GI  pathology panel, all negative --patient following with GI and found to have pancreatic insufficiency and started on creon .  #Nausea: --Symptoms improved with zofran  and olanzapine .  --  Added IV zofran  to infusion premeds for better symptom control.  --[name] sent compazine  for breakthrough nausea.   #Hypokalemia:  --currently taking potassium chloride  20 meq BID.   --potassium level is 3.5 today. Continue PO supplementation.   #Lower extremity edema --Currently takes lasix  20 mg once daily day. --Advised to  wear compression stockings.   #Neuropathy involving fingers/feet: --Patient currently takes Cymbalta    #Supportive Care -- chemotherapy education complete -- port placed -- zofran  8mg  q8H PRN and compazine  10mg  PO q6H for nausea -- EMLA  cream for port  No orders of the defined types were placed in this encounter.  All questions were answered. The patient knows to call the clinic with any problems, questions or concerns.  I have spent a total of 30 minutes minutes of face-to-face and non-face-to-face time, preparing to see the patient, performing a medically appropriate examination, counseling and educating the patient, ordering medications/tests, documenting clinical information in the electronic health record, and care coordination.   Norleen IVAR Kidney, MD Department of Hematology/Oncology [facility] Phone: [phone] Pager: [phone] Email: [email or web address]  01/14/2024 2:05 PM

## 2024-01-14 ENCOUNTER — Inpatient Hospital Stay

## 2024-01-14 ENCOUNTER — Inpatient Hospital Stay: Attending: Hematology and Oncology

## 2024-01-14 ENCOUNTER — Inpatient Hospital Stay: Attending: Hematology and Oncology | Admitting: Hematology and Oncology

## 2024-01-14 ENCOUNTER — Inpatient Hospital Stay: Admitting: Dietician

## 2024-01-14 VITALS — HR 90

## 2024-01-14 VITALS — BP 137/81 | HR 101 | Temp 97.1°F | Resp 17 | Wt 188.7 lb

## 2024-01-14 DIAGNOSIS — Z95828 Presence of other vascular implants and grafts: Secondary | ICD-10-CM | POA: Diagnosis not present

## 2024-01-14 DIAGNOSIS — E785 Hyperlipidemia, unspecified: Secondary | ICD-10-CM | POA: Diagnosis not present

## 2024-01-14 DIAGNOSIS — Z8719 Personal history of other diseases of the digestive system: Secondary | ICD-10-CM | POA: Insufficient documentation

## 2024-01-14 DIAGNOSIS — Z90710 Acquired absence of both cervix and uterus: Secondary | ICD-10-CM | POA: Insufficient documentation

## 2024-01-14 DIAGNOSIS — C3431 Malignant neoplasm of lower lobe, right bronchus or lung: Secondary | ICD-10-CM | POA: Diagnosis not present

## 2024-01-14 DIAGNOSIS — E278 Other specified disorders of adrenal gland: Secondary | ICD-10-CM | POA: Insufficient documentation

## 2024-01-14 DIAGNOSIS — K8689 Other specified diseases of pancreas: Secondary | ICD-10-CM | POA: Insufficient documentation

## 2024-01-14 DIAGNOSIS — F419 Anxiety disorder, unspecified: Secondary | ICD-10-CM | POA: Insufficient documentation

## 2024-01-14 DIAGNOSIS — E876 Hypokalemia: Secondary | ICD-10-CM | POA: Insufficient documentation

## 2024-01-14 DIAGNOSIS — E039 Hypothyroidism, unspecified: Secondary | ICD-10-CM | POA: Diagnosis not present

## 2024-01-14 DIAGNOSIS — Z8261 Family history of arthritis: Secondary | ICD-10-CM | POA: Insufficient documentation

## 2024-01-14 DIAGNOSIS — Z90721 Acquired absence of ovaries, unilateral: Secondary | ICD-10-CM | POA: Insufficient documentation

## 2024-01-14 DIAGNOSIS — Z79890 Hormone replacement therapy (postmenopausal): Secondary | ICD-10-CM | POA: Insufficient documentation

## 2024-01-14 DIAGNOSIS — F32A Depression, unspecified: Secondary | ICD-10-CM | POA: Diagnosis not present

## 2024-01-14 DIAGNOSIS — Z825 Family history of asthma and other chronic lower respiratory diseases: Secondary | ICD-10-CM | POA: Insufficient documentation

## 2024-01-14 DIAGNOSIS — Z9842 Cataract extraction status, left eye: Secondary | ICD-10-CM | POA: Insufficient documentation

## 2024-01-14 DIAGNOSIS — R0789 Other chest pain: Secondary | ICD-10-CM | POA: Diagnosis not present

## 2024-01-14 DIAGNOSIS — N281 Cyst of kidney, acquired: Secondary | ICD-10-CM | POA: Insufficient documentation

## 2024-01-14 DIAGNOSIS — Z823 Family history of stroke: Secondary | ICD-10-CM | POA: Insufficient documentation

## 2024-01-14 DIAGNOSIS — A4151 Sepsis due to Escherichia coli [E. coli]: Secondary | ICD-10-CM | POA: Insufficient documentation

## 2024-01-14 DIAGNOSIS — Z811 Family history of alcohol abuse and dependence: Secondary | ICD-10-CM | POA: Insufficient documentation

## 2024-01-14 DIAGNOSIS — M1612 Unilateral primary osteoarthritis, left hip: Secondary | ICD-10-CM | POA: Diagnosis not present

## 2024-01-14 DIAGNOSIS — Z5112 Encounter for antineoplastic immunotherapy: Secondary | ICD-10-CM | POA: Diagnosis not present

## 2024-01-14 DIAGNOSIS — Z809 Family history of malignant neoplasm, unspecified: Secondary | ICD-10-CM | POA: Insufficient documentation

## 2024-01-14 DIAGNOSIS — Z96641 Presence of right artificial hip joint: Secondary | ICD-10-CM | POA: Insufficient documentation

## 2024-01-14 DIAGNOSIS — K838 Other specified diseases of biliary tract: Secondary | ICD-10-CM | POA: Diagnosis not present

## 2024-01-14 DIAGNOSIS — Z880 Allergy status to penicillin: Secondary | ICD-10-CM | POA: Insufficient documentation

## 2024-01-14 DIAGNOSIS — Z886 Allergy status to analgesic agent status: Secondary | ICD-10-CM | POA: Insufficient documentation

## 2024-01-14 DIAGNOSIS — Z882 Allergy status to sulfonamides status: Secondary | ICD-10-CM | POA: Insufficient documentation

## 2024-01-14 DIAGNOSIS — J439 Emphysema, unspecified: Secondary | ICD-10-CM | POA: Insufficient documentation

## 2024-01-14 DIAGNOSIS — I10 Essential (primary) hypertension: Secondary | ICD-10-CM | POA: Diagnosis not present

## 2024-01-14 DIAGNOSIS — I700 Atherosclerosis of aorta: Secondary | ICD-10-CM | POA: Insufficient documentation

## 2024-01-14 DIAGNOSIS — Z888 Allergy status to other drugs, medicaments and biological substances status: Secondary | ICD-10-CM | POA: Insufficient documentation

## 2024-01-14 DIAGNOSIS — K7460 Unspecified cirrhosis of liver: Secondary | ICD-10-CM | POA: Diagnosis not present

## 2024-01-14 DIAGNOSIS — F17210 Nicotine dependence, cigarettes, uncomplicated: Secondary | ICD-10-CM | POA: Diagnosis not present

## 2024-01-14 DIAGNOSIS — I2510 Atherosclerotic heart disease of native coronary artery without angina pectoris: Secondary | ICD-10-CM | POA: Diagnosis not present

## 2024-01-14 DIAGNOSIS — Z820 Family history of epilepsy and other diseases of the nervous system: Secondary | ICD-10-CM | POA: Insufficient documentation

## 2024-01-14 DIAGNOSIS — Z79899 Other long term (current) drug therapy: Secondary | ICD-10-CM | POA: Diagnosis not present

## 2024-01-14 DIAGNOSIS — Z9841 Cataract extraction status, right eye: Secondary | ICD-10-CM | POA: Insufficient documentation

## 2024-01-14 DIAGNOSIS — Z9049 Acquired absence of other specified parts of digestive tract: Secondary | ICD-10-CM | POA: Insufficient documentation

## 2024-01-14 DIAGNOSIS — Z634 Disappearance and death of family member: Secondary | ICD-10-CM | POA: Insufficient documentation

## 2024-01-14 DIAGNOSIS — M549 Dorsalgia, unspecified: Secondary | ICD-10-CM | POA: Diagnosis not present

## 2024-01-14 DIAGNOSIS — Z8249 Family history of ischemic heart disease and other diseases of the circulatory system: Secondary | ICD-10-CM | POA: Insufficient documentation

## 2024-01-14 LAB — CMP (CANCER CENTER ONLY)
ALT: 11 U/L (ref 0–44)
AST: 20 U/L (ref 15–41)
Albumin: 3.9 g/dL (ref 3.5–5.0)
Alkaline Phosphatase: 106 U/L (ref 38–126)
Anion gap: 9 (ref 5–15)
BUN: 18 mg/dL (ref 8–23)
CO2: 31 mmol/L (ref 22–32)
Calcium: 8.8 mg/dL — ABNORMAL LOW (ref 8.9–10.3)
Chloride: 97 mmol/L — ABNORMAL LOW (ref 98–111)
Creatinine: 1.32 mg/dL — ABNORMAL HIGH (ref 0.44–1.00)
GFR, Estimated: 44 mL/min — ABNORMAL LOW (ref >60)
Glucose, Bld: 100 mg/dL — ABNORMAL HIGH (ref 70–99)
Potassium: 3.5 mmol/L (ref 3.5–5.1)
Sodium: 138 mmol/L (ref 135–145)
Total Bilirubin: 0.4 mg/dL (ref 0.0–1.2)
Total Protein: 6.7 g/dL (ref 6.5–8.1)

## 2024-01-14 LAB — CBC WITH DIFFERENTIAL (CANCER CENTER ONLY)
Abs Immature Granulocytes: 0.04 K/uL (ref 0.00–0.07)
Basophils Absolute: 0.1 K/uL (ref 0.0–0.1)
Basophils Relative: 1 %
Eosinophils Absolute: 0.4 K/uL (ref 0.0–0.5)
Eosinophils Relative: 5 %
HCT: 42.5 % (ref 36.0–46.0)
Hemoglobin: 13.9 g/dL (ref 12.0–15.0)
Immature Granulocytes: 1 %
Lymphocytes Relative: 27 %
Lymphs Abs: 2.3 K/uL (ref 0.7–4.0)
MCH: 29.7 pg (ref 26.0–34.0)
MCHC: 32.7 g/dL (ref 30.0–36.0)
MCV: 90.8 fL (ref 80.0–100.0)
Monocytes Absolute: 0.7 K/uL (ref 0.1–1.0)
Monocytes Relative: 8 %
Neutro Abs: 5 K/uL (ref 1.7–7.7)
Neutrophils Relative %: 58 %
Platelet Count: 178 K/uL (ref 150–400)
RBC: 4.68 MIL/uL (ref 3.87–5.11)
RDW: 15.9 % — ABNORMAL HIGH (ref 11.5–15.5)
WBC Count: 8.6 K/uL (ref 4.0–10.5)
nRBC: 0 % (ref 0.0–0.2)

## 2024-01-14 LAB — TSH: TSH: <0.1 u[IU]/mL — ABNORMAL LOW (ref 0.350–4.500)

## 2024-01-14 MED ORDER — SODIUM CHLORIDE 0.9 % IV SOLN
1200.0000 mg | Freq: Once | INTRAVENOUS | Status: AC
  Administered 2024-01-14: 12:00:00 1200 mg via INTRAVENOUS
  Filled 2024-01-14: qty 20

## 2024-01-14 MED ORDER — ONDANSETRON HCL 4 MG/2ML IJ SOLN
8.0000 mg | Freq: Once | INTRAMUSCULAR | Status: AC
  Administered 2024-01-14: 11:00:00 8 mg via INTRAVENOUS
  Filled 2024-01-14: qty 4

## 2024-01-14 MED ORDER — SODIUM CHLORIDE 0.9 % IV SOLN: Freq: Once | INTRAVENOUS | Status: AC

## 2024-01-14 NOTE — Patient Instructions (Signed)
 [facility] - A DEPT OF [facility]  Discharge Instructions: Thank you for choosing [facility] to provide your oncology and hematology care.   If you have a lab appointment with the Cancer Center, please go directly to the Cancer Center and check in at the registration area.   Wear comfortable clothing and clothing appropriate for easy access to any Portacath or PICC line.   We strive to give you quality time with your provider. You may need to reschedule your appointment if you arrive late (15 or more minutes).  Arriving late affects you and other patients whose appointments are after yours.  Also, if you miss three or more appointments without notifying the office, you may be dismissed from the clinic at the provider's discretion.      For prescription refill requests, have your pharmacy contact our office and allow 72 hours for refills to be completed.    Today you received the following chemotherapy and/or immunotherapy agents: Atezolizumab  (Tecentriq )    To help prevent nausea and vomiting after your treatment, we encourage you to take your nausea medication as directed.  BELOW ARE SYMPTOMS THAT SHOULD BE REPORTED IMMEDIATELY: *FEVER GREATER THAN 100.4 F (38 C) OR HIGHER *CHILLS OR SWEATING *NAUSEA AND VOMITING THAT IS NOT CONTROLLED WITH YOUR NAUSEA MEDICATION *UNUSUAL SHORTNESS OF BREATH *UNUSUAL BRUISING OR BLEEDING *URINARY PROBLEMS (pain or burning when urinating, or frequent urination) *BOWEL PROBLEMS (unusual diarrhea, constipation, pain near the anus) TENDERNESS IN MOUTH AND THROAT WITH OR WITHOUT PRESENCE OF ULCERS (sore throat, sores in mouth, or a toothache) UNUSUAL RASH, SWELLING OR PAIN  UNUSUAL VAGINAL DISCHARGE OR ITCHING   Items with * indicate a potential emergency and should be followed up as soon as possible or go to the Emergency Department if any problems should occur.  Please show the CHEMOTHERAPY ALERT CARD or  IMMUNOTHERAPY ALERT CARD at check-in to the Emergency Department and triage nurse.  Should you have questions after your visit or need to cancel or reschedule your appointment, please contact [facility] - A DEPT OF [facility]  Dept: [phone]  and follow the prompts.  Office hours are 8:00 a.m. to 4:30 p.m. Monday - Friday. Please note that voicemails left after 4:00 p.m. may not be returned until the following business day.  We are closed weekends and major holidays. You have access to a nurse at all times for urgent questions. Please call the main number to the clinic Dept: [phone] and follow the prompts.   For any non-urgent questions, you may also contact your provider using MyChart. We now offer e-Visits for anyone 53 and older to request care online for non-urgent symptoms. For details visit [email or web address].   Also download the MyChart app! Go to the app store, search MyChart, open the app, select [facility], and log in with your MyChart username and password.

## 2024-01-14 NOTE — Progress Notes (Signed)
 Nutrition Follow-up:  Patient with extensive stage small cell lung cancer. She is receiving maintenance therapy with atezolizumab  q21d.   Met with patient in infusion. Reports doing fairly well. Says things have been good at home. Husband has starting cooking supper. Ate a hamburger the other night and recalls 2 boiled chicken sandwiches last night. Patient may or may not snack during the day. Denies nausea or vomiting.   Patient continues to have lower extremity swelling, +4 very deep pitting on RD exam.  Wears compression stockings at home. Patient notes having a hard time with increased work of breathing the last 3 weeks. Lasix  increased to 40 mg/day by FNP on 12/2. Patient endorses compliance.    Medications: reviewed   Labs: glucose 100, Cr 1.32  Anthropometrics: Wt 188 lb 11.2 oz today (actual wt masked by fluid - 4+ BLE present at visit)   11/26 - 189 lb 4.8 oz (BLE edema) 11/5 - 182 lb  10/14 - 178 lb 8 oz    NUTRITION DIAGNOSIS: Inadequate oral intake ongoing   INTERVENTION:  Continue to encourage small meals/snacks  Message to provider regarding edema    MONITORING, EVALUATION, GOAL: wt trends, intake   NEXT VISIT: Wednesday January 28 during infusion

## 2024-01-15 LAB — T4: T4, Total: 15.5 ug/dL — ABNORMAL HIGH (ref 4.5–12.0)

## 2024-01-20 ENCOUNTER — Other Ambulatory Visit: Payer: Self-pay | Admitting: Physical Medicine and Rehabilitation

## 2024-01-21 ENCOUNTER — Other Ambulatory Visit: Payer: Self-pay | Admitting: Physical Medicine and Rehabilitation

## 2024-01-27 ENCOUNTER — Other Ambulatory Visit: Payer: Self-pay | Admitting: Hematology and Oncology

## 2024-02-02 ENCOUNTER — Encounter: Payer: Self-pay | Admitting: Hematology and Oncology

## 2024-02-03 NOTE — Progress Notes (Unsigned)
 " [facility] [phone]   [phone]  PROGRESS NOTE  Patient Care Team: [name], FNP as PCP - General (Family Medicine) O'Neal, Darryle Ned, MD as PCP - Cardiology (Cardiology) [name], MD (Inactive) as Attending Physician (Gastroenterology) [name], MD as Consulting Physician (Orthopedic Surgery) [name], MD as Consulting Physician (Orthopedic Surgery) [name], MD (Inactive) as Consulting Physician (Neurology) [name], MD as Consulting Physician (Neurology) [name], [facility] (Inactive) (Pharmacist) [facility]ll[facility], [facility]   Hematological/Oncological History # Small Cell Lung Cancer, Extensive Stage 07/05/2020: CT abdomen for lower abdominal pain. New pulmonary nodular density noted 07/06/2020: CT chest showed 2.2 cm macrolobulated right lower lobe pulmonary nodule (favored) versus pathologically enlarged infrahilar lymph node 10/13/2020: PET CT scan performed, findings show 2 cm right lower lobe lung mass is hypermetabolic and consistent with primary lung neoplasm. Additionally found hypermetabolic 17 mm soft tissue lesion between the descending duodenum and the pancreatic head  10/19/2020: EGD to biopsy hypermetabolic lymph node. Biopsy results show small cell lung cancer 10/26/2020: establish care with Dr. Federico  11/13/2020: Cycle 1 Day 1 of Carbo/Etop/Atezolizumab  12/04/2020: Cycle 2 Day 1 of Carbo/Etop/Atezolizumab  11/22-11/25/2022: admitted for E. Coli bacteremia/sepsis. Start of Cycle 3 delayed. 01/02/2021: Cycle 3 Day 1 of Carbo/Etop/Atezolizumab  01/23/2021: Cycle 4 Day 1 of Carbo/Etop/Atezolizumab  02/22/2021: Cycle 5 Day 1 of Atezolizumab  Maintenance. Delayed start due to patient's COVID infection.  03/14/2021: Cycle 6 Day 1 of Atezolizumab  Maintenance 04/05/2021: Cycle 7 Day 1 of Atezolizumab  Maintenance 04/25/2021: Cycle 8 Day 1 of Atezolizumab   Maintenance 05/16/2021: Cycle 9 Day 1 of Atezolizumab  Maintenance 06/06/2021: Cycle 10 Day 1 of Atezolizumab  Maintenance 06/27/2021:  Cycle 11 Day 1 of Atezolizumab  Maintenance 07/18/2021: Cycle 12 Day 1 of Atezolizumab  Maintenance 08/08/2021: Cycle 13 Day 1 of Atezolizumab  Maintenance 08/29/2021: Cycle 14 Day 1 of Atezolizumab  Maintenance 09/19/2021: Cycle 15 Day 1 of Atezolizumab  Maintenance 10/10/2021: Cycle 16 Day 1 of Atezolizumab  Maintenance 10/31/2021: Cycle 17 Day 1 of Atezolizumab  Maintenance 11/21/2021: Cycle 18 Day 1 of Atezolizumab  Maintenance 12/12/2021: Cycle 19 Day 1 of Atezolizumab  Maintenance 01/02/2022: Cycle 20 Day 1 of Atezolizumab  Maintenance 01/23/2022: Cycle 21 Day 1 of Atezolizumab  Maintenance 02/13/2022: treatment HELD due to diarrhea.  03/06/2022: Cycle 22 Day 1 of Atezolizumab  Maintenance 03/27/2022: Cycle 23 Day 1 of Atezolizumab  Maintenance 04/17/2022: Cycle 24 Day 1 of Atezolizumab  Maintenance 05/08/2022:  Cycle 25 Day 1 of Atezolizumab  Maintenance (Held due to patient preference for treatment holiday) 05/29/2022: Cycle 25 Day 1 of Atezolizumab  Maintenance  06/19/2022: Cycle 26 Day 1 of Atezolizumab  Maintenance  07/12/2022: Cycle 27 Day 1 of Atezolizumab  Maintenance 07/31/2022: Cycle 28 Day 1 of Atezolizumab  Maintenance HELD per patient request for fatigue.  08/22/2022: Cycle 28 Day 1 of Atezolizumab  Maintenance 09/11/2022: Cycle 29 Day 1 of Atezolizumab  Maintenance 10/03/2022: Cycle 30 Day 1 of Atezolizumab  Maintenance 10/23/2022: Cycle 31 Day 1 of Atezolizumab  Maintenance 11/13/2022: Cycle 32 Day 1 of Atezolizumab  Maintenance 12/24/2022: Cycle 33 Day 1 of Atezolizumab  Maintenance 01/15/2023: Cycle 34 Day 1 of Atezolizumab  Maintenance 02/05/2023: Cycle 35 Day 1 of Atezolizumab  Maintenance 02/26/2023: Cycle 36 Day 1 of Atezolizumab  Maintenance 04/28/2023: Cycle 37 Day 1 of Atezolizumab  Maintenance 04/30/2023: Cycle 38 Day 1 of Atezolizumab  Maintenance 05/21/2023: Cycle 39 Day 1 of  Atezolizumab  Maintenance 06/11/2023: Cycle 40 Day 1 of Atezolizumab  Maintenance 07/28/2023: Cycle 41 Day 1 of Atezolizumab  Maintenance 08/18/2023: Cycle 42 Day 1 of Atezolizumab  Maintenance 09/08/2023: Cycle 43 Day 1 of Atezolizumab  Maintenance 09/30/2023:  Cycle 44 Day 1 of Atezolizumab  Maintenance 10/21/2023: Cycle 45 Day 1 of Atezolizumab  Maintenance 11/11/2023: Cycle 46 Day 1 of Atezolizumab  Maintenance 12/03/2023: Cycle 47 Day 1 of Atezolizumab  Maintenance 12/24/2023: Cycle 48 Day 1 of Atezolizumab  Maintenance 01/14/2024: Cycle 49 Day 1 of Atezolizumab  Maintenance 02/04/2023: Cycle 50 Day 1 of Atezolizumab  Maintenance  Interval History:  [name] 69 y.o. female with medical history significant for extensive stage small cell lung cancer who presents for a follow up visit. The patient's last visit was on 01/14/2024.  She presents today to start cycle 50 of maintenance atezolizumab . She is unaccompanied for this visit.   On exam today [name] reports her energy levels are overall stable.  He has occasional nausea that is improved with Zofran  and Compazine  as needed.  She recently had a fall after the last visit after tripping on the curb.  She reports occasional dizziness without any syncopal episodes.  She denies abdominal pain or bowel habit changes.  She denies easy bruising or overt signs of bleeding.  She denies fevers, chills, night sweats, shortness of breath, chest pain or cough.  Rest of 10 point ROS as below.  Follow-up  MEDICAL HISTORY:  Past Medical History:  Diagnosis Date   Allergic rhinitis    Anemia    Anxiety    Chicken pox    Chronic back pain    COPD (chronic obstructive pulmonary disease) (HCC)    Depression    DM (diabetes mellitus) (HCC)    Essential hypertension    GERD (gastroesophageal reflux disease)    Headache    migraines   History of gastritis    EGD 2015   History of home oxygen  therapy    2 liters at hs last 6 months   Hyperlipidemia     Hypothyroidism    Migraines    Osteoarthritis    oa   Scoliosis    Small cell lung cancer (HCC)    Stage 4    SURGICAL HISTORY: Past Surgical History:  Procedure Laterality Date   APPENDECTOMY     1985   BIOPSY  07/24/2017   Procedure: BIOPSY;  Surgeon: [name], MD;  Location: [facility];  Service: Endoscopy;;   CARDIAC CATHETERIZATION N/A 10/31/2015   Procedure: Left Heart Cath and Coronary Angiography;  Surgeon: Alm LELON Clay, MD;  Location: [facility] INVASIVE CV LAB;  Service: Cardiovascular;  Laterality: N/A;   CARPAL TUNNEL RELEASE Left    CARPAL TUNNEL RELEASE Right    CATARACT EXTRACTION W/PHACO Left 02/07/2023   Procedure: CATARACT EXTRACTION PHACO AND INTRAOCULAR LENS PLACEMENT (IOC);  Surgeon: Juli Blunt, MD;  Location: [facility];  Service: Ophthalmology;  Laterality: Left;  CDE: 3.51   CATARACT EXTRACTION W/PHACO Right 02/21/2023   Procedure: CATARACT EXTRACTION PHACO AND INTRAOCULAR LENS PLACEMENT (IOC);  Surgeon: Juli Blunt, MD;  Location: [facility];  Service: Ophthalmology;  Laterality: Right;  CDE: 2.94   CHOLECYSTECTOMY  late 1980's   COLONOSCOPY WITH PROPOFOL  N/A 07/24/2017   Procedure: COLONOSCOPY WITH PROPOFOL ;  Surgeon: [name], MD;  Location: [facility];  Service: Endoscopy;  Laterality: N/A;   ESOPHAGOGASTRODUODENOSCOPY N/A 07/24/2017   Procedure: ESOPHAGOGASTRODUODENOSCOPY (EGD);  Surgeon: [name], MD;  Location: [facility];  Service: Endoscopy;  Laterality: N/A;   ESOPHAGOGASTRODUODENOSCOPY (EGD) WITH PROPOFOL  N/A 10/19/2020   Procedure: ESOPHAGOGASTRODUODENOSCOPY (EGD) WITH PROPOFOL ;  Surgeon: [name], MD;  Location: [facility];  Service: Endoscopy;  Laterality: N/A;   FINE NEEDLE ASPIRATION N/A 10/19/2020  Procedure: FINE NEEDLE ASPIRATION (FNA) LINEAR;  Surgeon: [name], MD;  Location: [facility];  Service: Endoscopy;  Laterality: N/A;   GALLBLADDER SURGERY  1991   HIP CLOSED REDUCTION Right 01/08/2016    Procedure: CLOSED MANIPULATION HIP;  Surgeon: Reyes Billing, MD;  Location: [facility];  Service: Orthopedics;  Laterality: Right;   HIP CLOSED REDUCTION Right 01/19/2016   Procedure: ATTEMPTED CLOSED REDUCTION RIGHT HIP;  Surgeon: Norleen Armor, MD;  Location: [facility];  Service: Orthopedics;  Laterality: Right;   HIP CLOSED REDUCTION Right 01/20/2016   Procedure: CLOSED REDUCTION RIGHT TOTAL HIP;  Surgeon: Donnice Car, MD;  Location: [facility];  Service: Orthopedics;  Laterality: Right;   HIP CLOSED REDUCTION Right 02/17/2016   Procedure: CLOSED REDUCTION RIGHT TOTAL HIP;  Surgeon: Redell Shoals, MD;  Location: [facility];  Service: Orthopedics;  Laterality: Right;   HIP CLOSED REDUCTION Right 02/28/2016   Procedure: CLOSED REDUCTION HIP;  Surgeon: Selinda Belvie Gosling, MD;  Location: [facility];  Service: Orthopedics;  Laterality: Right;   IR IMAGING GUIDED PORT INSERTION  11/01/2020   POLYPECTOMY  07/24/2017   Procedure: POLYPECTOMY;  Surgeon: [name], MD;  Location: [facility];  Service: Endoscopy;;   TONSILLECTOMY     TOTAL ABDOMINAL HYSTERECTOMY     1985, with 1 ovary removed and 2 nd ovary removed 2003   TOTAL HIP ARTHROPLASTY Right    Original surgery 2006 with revision 2010   TOTAL HIP REVISION Right 01/01/2016   Procedure: TOTAL HIP REVISION;  Surgeon: Donnice Car, MD;  Location: [facility];  Service: Orthopedics;  Laterality: Right;   TOTAL HIP REVISION Right 03/02/2016   Procedure: TOTAL HIP REVISION;  Surgeon: Car Donnice, MD;  Location: [facility];  Service: Orthopedics;  Laterality: Right;   TOTAL HIP REVISION Right 09/02/2016   Procedure: Right hip constrained liner- posterior;  Surgeon: Car Donnice, MD;  Location: [facility];  Service: Orthopedics;  Laterality: Right;   ULNAR NERVE TRANSPOSITION Right    UPPER ESOPHAGEAL ENDOSCOPIC ULTRASOUND (EUS) N/A 10/19/2020   Procedure: UPPER ESOPHAGEAL ENDOSCOPIC ULTRASOUND (EUS);  Surgeon: [name], MD;  Location: [facility];  Service: Endoscopy;   Laterality: N/A;  periduodenal lesion    SOCIAL HISTORY: Social History   Socioeconomic History   Marital status: Married    Spouse name: Not on file   Number of children: 2   Years of education: Not on file   Highest education level: 12th grade  Occupational History   Occupation: disabled   Occupation: disabled  Tobacco Use   Smoking status: Every Day    Current packs/day: 2.00    Average packs/day: 2.0 packs/day for 46.0 years (92.0 ttl pk-yrs)    Types: Cigarettes   Smokeless tobacco: Never   Tobacco comments:    2 packs of cigarettes smoked daily 12/13/21- declines smoking cessation  Vaping Use   Vaping status: Never Used  Substance and Sexual Activity   Alcohol use: No   Drug use: No   Sexual activity: Not Currently    Partners: Male  Other Topics Concern   Not on file  Social History Narrative   Right handed    Caffeine~ 2 cups per day    Lives at home with husband (strained relationship)   Primary caretaker for disabled brother who had aneurism   Daughter died Jun 21, 2018   Social Drivers of Health   Tobacco Use: High Risk (12/30/2023)   Patient History    Smoking Tobacco Use: Every Day  Smokeless Tobacco Use: Never    Passive Exposure: Not on file  Financial Resource Strain: Low Risk (07/31/2023)   Received [facility]   Overall Financial Resource Strain (CARDIA)    How hard is it for you to pay for the very basics like food, housing, medical care, and heating?: Not very hard  Food Insecurity: No Food Insecurity (07/31/2023)   Received from [facility]   Epic    Within the past 12 months, you worried that your food would run out before you got the money to buy more.: Never true    Within the past 12 months, the food you bought just didn't last and you didn't have money to get more.: Never true  Recent Concern: Food Insecurity - Food Insecurity Present (05/20/2023)   Hunger Vital Sign    Worried About Running Out of Food in the Last Year:  Sometimes true    Ran Out of Food in the Last Year: Sometimes true  Transportation Needs: No Transportation Needs (07/31/2023)   Received from [facility]   PRAPARE - Transportation    Lack of Transportation (Medical): No    Lack of Transportation (Non-Medical): No  Physical Activity: Inactive (05/20/2023)   Exercise Vital Sign    Days of Exercise per Week: 0 days    Minutes of Exercise per Session: 0 min  Stress: No Stress Concern Present (05/20/2023)   [employer] of Occupational [employer] - Occupational Stress Questionnaire    Feeling of Stress : Only a little  Recent Concern: Stress - Stress Concern Present (03/31/2023)   [employer] of Occupational [employer] - Occupational Stress Questionnaire    Feeling of Stress : Very much  Social Connections: Moderately Integrated (05/20/2023)   Social Connection and Isolation Panel    Frequency of Communication with Friends and Family: More than three times a week    Frequency of Social Gatherings with Friends and Family: More than three times a week    Attends Religious Services: 1 to 4 times per year    Active Member of [employer] or Organizations: No    Attends Banker Meetings: Never    Marital Status: Married  Catering Manager Violence: Not At Risk (07/31/2023)   Received from [facility]   Epic    Within the last year, have you been afraid of your partner or ex-partner?: No    Within the last year, have you been humiliated or emotionally abused in other ways by your partner or ex-partner?: No    Within the last year, have you been kicked, hit, slapped, or otherwise physically hurt by your partner or ex-partner?: No    Within the last year, have you been raped or forced to have any kind of sexual activity by your partner or ex-partner?: No  Depression (PHQ2-9): Medium Risk (12/24/2023)   Depression (PHQ2-9)    PHQ-2 Score: 5  Alcohol Screen: Low Risk (05/20/2023)   Alcohol Screen    Last Alcohol Screening Score (AUDIT):  0  Housing: Low Risk (05/20/2023)   Housing Stability Vital Sign    Unable to Pay for Housing in the Last Year: No    Number of Times Moved in the Last Year: 0    Homeless in the Last Year: No  Utilities: Low Risk (07/31/2023)   Received from [facility]   Utilities    Within the past 12 months, have you been unable to get utilities(heat, electricity) when it was really needed?: No  Health Literacy: Adequate Health Literacy (05/20/2023)   B1300 Health Literacy    Frequency of need for help with medical instructions: Never    FAMILY HISTORY: Family History  Problem Relation Age of Onset   COPD Mother    Heart disease Mother    Lung disease Father        Asbestosis   Heart attack Father    Heart disease Father    Cerebral aneurysm Brother    Aneurysm Brother        Brain   Drug abuse Daughter    Epilepsy Son    Alcohol abuse Son    Drug abuse Son    Arthritis Maternal Grandmother    Heart disease Maternal Grandmother    Asthma Maternal Grandfather    Cancer Maternal Grandfather    Arthritis Paternal Grandmother    Heart disease Paternal Grandmother    Stroke Paternal Grandmother    Early death Paternal Grandfather    Heart disease Paternal Grandfather     ALLERGIES:  is allergic to nisoldipine, metformin  and related, nsaids, pregabalin, wellbutrin  [bupropion ], aleve [naproxen sodium], penicillins, and sulfonamide derivatives.  MEDICATIONS:  Current Outpatient Medications  Medication Sig Dispense Refill   albuterol  (PROAIR  HFA) 108 (90 Base) MCG/ACT inhaler 2 puffs every 4 hours as needed only  if your can't catch your breath 18 g 5   ALPRAZolam  (XANAX ) 1 MG tablet TAKE 1 TABLET BY MOUTH THREE TIMES DAILY AS NEEDED FOR ANXIETY 90 tablet 2   ARIPiprazole  (ABILIFY ) 5 MG tablet Take 1 tablet (5 mg total) by mouth daily. 30 tablet 5   atorvastatin  (LIPITOR) 20 MG tablet TAKE 1 TABLET BY MOUTH AT BEDTIME 90 tablet 1   benzonatate  (TESSALON ) 100 MG capsule Take 1 capsule  (100 mg total) by mouth 3 (three) times daily. 90 capsule 1   calcipotriene  (DOVONOX) 0.005 % cream Apply to affected area bid when on break from steroid cream 120 g 3   cyclobenzaprine  (FLEXERIL ) 10 MG tablet Take 10 mg by mouth 3 (three) times daily as needed.     dicyclomine  (BENTYL ) 10 MG capsule TAKE 1 CAPSULE BY MOUTH EVERY 8 HOURS AS NEEDED FOR SPASMS 60 capsule 1   diphenoxylate -atropine  (LOMOTIL ) 2.5-0.025 MG tablet TAKE 1 TABLET BY MOUTH FOUR TIMES DAILY AS NEEDED FOR diarrhea OR loose stools 60 tablet 0   DULoxetine  (CYMBALTA ) 60 MG capsule TAKE 2 CAPSULES BY MOUTH EVERY DAY 180 capsule 1   ferrous sulfate  325 (65 FE) MG EC tablet TAKE 1 TABLET BY MOUTH DAILY WITH BREAKFAST 30 tablet 3   fluticasone  (FLONASE ) 50 MCG/ACT nasal spray INSTILL 2 SPRAYS IN EACH NOSTRIL EVERY DAY 16 g 6   gabapentin (NEURONTIN) 100 MG capsule Take 100 mg by mouth 2 (two) times daily.     HYDROcodone -acetaminophen  (NORCO) 10-325 MG tablet Take 1 tablet by mouth every 4 (four) hours as needed. 180 tablet 0   HYDROmorphone  (DILAUDID ) 4 MG tablet Take 1 tablet (4 mg total) by mouth every 6 (six) hours as needed for severe pain (pain score 7-10). 120 tablet 0   levothyroxine  (SYNTHROID ) 137 MCG tablet Take 1 tablet (137 mcg total) by mouth daily before breakfast. 90 tablet 3   lidocaine -prilocaine  (EMLA ) cream Apply 1 Application topically as needed. 30 g 2   loratadine  (CLARITIN ) 10 MG tablet Take 10 mg by mouth daily.     meclizine  (ANTIVERT ) 12.5 MG tablet TAKE 1 TABLET BY MOUTH TWICE DAILY 60 tablet 2   meloxicam  (MOBIC ) 15  MG tablet TAKE 1 TABLET BY MOUTH EVERY DAY 30 tablet 0   NARCAN  4 MG/0.1ML LIQD nasal spray kit SMARTSIG:Both Nares     nystatin  (MYCOSTATIN ) 100000 UNIT/ML suspension      OLANZapine  (ZYPREXA ) 10 MG tablet Take 1 tablet (10 mg total) by mouth at bedtime. 30 tablet 2   omeprazole  (PRILOSEC) 40 MG capsule Take 1 capsule (40 mg total) by mouth 2 (two) times daily. 180 capsule 3   permethrin   (ELIMITE ) 5 % cream      potassium chloride  SA (KLOR-CON  M) 20 MEQ tablet TAKE 1 TABLET BY MOUTH TWICE DAILY 60 tablet 2   Rimegepant Sulfate (NURTEC) 75 MG TBDP TAKE 1 TABLET BY MOUTH DAILY AS NEEDED FOR MIGRAINE, take WITHIN 15 MINUTES of initial symptoms 8 tablet 1   topiramate  (TOPAMAX ) 100 MG tablet Take 1 tablet (100 mg total) by mouth at bedtime. 90 tablet 1   triamcinolone  ointment (KENALOG ) 0.5 % Apply 1 Application topically 3 (three) times daily. Apply thin-layer on affected areas, no more than 2 weeks consistently. 60 g 2   triamterene -hydrochlorothiazide  (MAXZIDE -25) 37.5-25 MG tablet Take 1 tablet by mouth daily.     umeclidinium-vilanterol (ANORO ELLIPTA ) 62.5-25 MCG/ACT AEPB Inhale 1 puff into the lungs daily. 60 each 11   ciprofloxacin  (CIPRO ) 250 MG tablet Take 1 tablet (250 mg total) by mouth 2 (two) times daily. (Patient not taking: Reported on 02/04/2024) 10 tablet 0   fluconazole  (DIFLUCAN ) 150 MG tablet Take 150 mg by mouth once. (Patient not taking: Reported on 01/14/2024)     furosemide  (LASIX ) 20 MG tablet TAKE 1 TABLET BY MOUTH TWICE DAILY. If swelling improves can hold medication. 90 tablet 0   HYDROmorphone  (DILAUDID ) 4 MG tablet Take 1 tablet (4 mg total) by mouth every 6 (six) hours as needed for severe pain (pain score 7-10). Increased dose because 2 mg wasn't working well enough- [name] send in another supply so has enough pills- G89.2- chronic pain due to cancer 120 tablet 0   hydrOXYzine  (ATARAX ) 25 MG tablet Take 1 tablet (25 mg total) by mouth every 6 (six) hours as needed. 90 tablet 2   ondansetron  (ZOFRAN ) 8 MG tablet Take 1 tablet (8 mg total) by mouth 2 (two) times daily. 60 tablet 1   prochlorperazine  (COMPAZINE ) 10 MG tablet Take 1 tablet (10 mg total) by mouth every 6 (six) hours as needed for nausea or vomiting. 30 tablet 0   No current facility-administered medications for this visit.    REVIEW OF SYSTEMS:   Constitutional: ( - ) fevers, ( - )  chills , (  - ) night sweats Eyes: ( - ) blurriness of vision, ( - ) double vision, ( - ) watery eyes Ears, nose, mouth, throat, and face: ( - ) mucositis, ( - ) sore throat Respiratory: ( - ) cough, ( -) dyspnea, ( - ) wheezes Cardiovascular: ( - ) palpitation, ( - ) chest discomfort, ( + ) lower extremity swelling Gastrointestinal:  ( -) nausea, ( - ) heartburn, ( - ) change in bowel habits Skin: ( - ) abnormal skin rashes Lymphatics: ( - ) new lymphadenopathy, ( - ) easy bruising Neurological: (+ ) numbness, ( - ) tingling, ( - ) new weaknesses Behavioral/Psych: ( - ) mood change, ( - ) new changes  All other systems were reviewed with the patient and are negative.  PHYSICAL EXAMINATION: ECOG PERFORMANCE STATUS: 1 - Symptomatic but completely ambulatory  There were no vitals filed  for this visit.  There were no vitals filed for this visit.    GENERAL: Well-appearing middle-age Caucasian female, alert, no distress and comfortable SKIN: skin color, texture, turgor are normal, no rashes or significant lesions EYES: conjunctiva are pink and non-injected, sclera clear LUNGS:  normal breathing effort. Diffuse wheezing hear on ausculation.  HEART: regular rate & rhythm and no murmurs.  Musculoskeletal: no cyanosis of digits and no clubbing  PSYCH: alert & oriented x 3, fluent speech NEURO: no focal motor/sensory deficits  LABORATORY DATA:  I have reviewed the data as listed    Latest Ref Rng & Units 02/04/2024   11:03 AM 01/14/2024   10:16 AM 12/24/2023   10:37 AM  CBC  WBC 4.0 - 10.5 K/uL 8.0  8.6  8.0   Hemoglobin 12.0 - 15.0 g/dL 86.9  86.0  86.4   Hematocrit 36.0 - 46.0 % 39.3  42.5  41.0   Platelets 150 - 400 K/uL 165  178  147        Latest Ref Rng & Units 02/04/2024   11:03 AM 01/14/2024   10:16 AM 12/24/2023   10:37 AM  CMP  Glucose 70 - 99 mg/dL 96  899  87   BUN 8 - 23 mg/dL 14  18  14    Creatinine 0.44 - 1.00 mg/dL 8.74  8.67  8.81   Sodium 135 - 145 mmol/L 140  138  140    Potassium 3.5 - 5.1 mmol/L 3.8  3.5  4.0   Chloride 98 - 111 mmol/L 105  97  103   CO2 22 - 32 mmol/L 26  31  29    Calcium  8.9 - 10.3 mg/dL 8.6  8.8  8.3   Total Protein 6.5 - 8.1 g/dL 6.2  6.7  6.1   Total Bilirubin 0.0 - 1.2 mg/dL 0.4  0.4  0.3   Alkaline Phos 38 - 126 U/L 94  106  87   AST 15 - 41 U/L 20  20  19    ALT 0 - 44 U/L 7  11  13      No results found for: MPROTEIN Lab Results  Component Value Date   KPAFRELGTCHN 0.75 06/30/2014   LAMBDASER 3.78 (H) 06/30/2014   KAPLAMBRATIO 0.20 (L) 06/30/2014     RADIOGRAPHIC STUDIES: CT CHEST ABDOMEN PELVIS W CONTRAST Result Date: 01/14/2024 CLINICAL DATA:  Restaging small cell lung cancer. Patient currently undergoing chemotherapy. * Tracking Code: BO * EXAM: CT CHEST, ABDOMEN, AND PELVIS WITH CONTRAST TECHNIQUE: Multidetector CT imaging of the chest, abdomen and pelvis was performed following the standard protocol during bolus administration of intravenous contrast. RADIATION DOSE REDUCTION: This exam was performed according to the departmental dose-optimization program which includes automated exposure control, adjustment of the mA and/or kV according to patient size and/or use of iterative reconstruction technique. CONTRAST:  OMNIPAQUE  IOHEXOL  300 MG/ML  SOLN COMPARISON:  Multiple prior imaging studies. Most recent CT scan is 10/15/2023. FINDINGS: CT CHEST FINDINGS Cardiovascular: The heart is normal in size. No pericardial effusion. The aorta is normal in caliber. No dissection. Stable atherosclerotic calcifications. Stable branch vessel calcifications including three-vessel coronary artery calcifications. The pulmonary arteries appear normal. Mediastinum/Nodes: Small scattered mediastinal and hilar lymph nodes are stable. No new progressive findings. The esophagus is unremarkable. Lungs/Pleura: Stable underlying emphysematous changes and areas of pulmonary scarring. No acute pulmonary findings. No worrisome pulmonary lesions or  nodules. No recurrent right lower lobe pulmonary mass. No pleural effusions or pleural lesions. Musculoskeletal: No  breast masses, supraclavicular or axillary adenopathy. The right IJ Port-A-Cath stable the bony thorax is intact. No bone lesions fractures. CT ABDOMEN PELVIS FINDINGS Hepatobiliary: No focal liver abnormality is seen. Status post cholecystectomy. Stable moderate common bile duct dilatation. Pancreas: Unremarkable. No pancreatic ductal dilatation or surrounding inflammatory changes. Spleen: Normal in size without focal abnormality. Adrenals/Urinary Tract: Stable 10 mm left adrenal gland nodule consistent with a benign adenoma. The right adrenal gland is normal. No worrisome renal lesions. Stable hemorrhagic or proteinaceous cyst involving the midpole region of the right kidney. The bladder is unremarkable. Stomach/Bowel: The stomach, duodenum, small bowel and colon are grossly normal without oral contrast. No inflammatory changes, mass lesions or obstructive findings. The appendix is surgically absent. Large amount of stool throughout the and down into the rectum suggesting chronic constipation. Vascular/Lymphatic: Stable significant age advanced atherosclerotic calcifications involving the aorta and iliac arteries but no aneurysm, dissection or occlusion. The major venous structures are patent. No mesenteric retroperitoneal mass or adenopathy. Small scattered lymph nodes are stable. Reproductive: Surgically absent. Other: No pelvic mass or adenopathy. No free pelvic fluid collections. No inguinal mass or adenopathy. No abdominal wall hernia or subcutaneous lesions. Musculoskeletal: No findings suspicious for osseous metastatic disease. Stable degenerative changes involving the spine and left hip. Total right hip arthroplasty again noted. IMPRESSION: 1. Stable CT appearance of the chest, abdomen and pelvis. No findings for recurrent tumor, adenopathy or metastatic disease. 2. Stable 10 mm left adrenal  gland nodule consistent with a benign adenoma. 3. Stable significant age advanced atherosclerotic calcifications involving the thoracic and abdominal aorta and branch vessels including three-vessel coronary artery calcifications. 4. Large amount of stool throughout the colon and down into the rectum suggesting chronic constipation. Aortic Atherosclerosis (ICD10-I70.0) and Emphysema (ICD10-J43.9). Electronically Signed   By: MYRTIS Stammer M.D.   On: 01/14/2024 11:48   ASSESSMENT & PLAN [name] is a 69 y.o. female with medical history significant for extensive stage small cell lung cancer who presents for a follow up visit.   # Small Cell Lung Cancer, Extensive Stage -- MRI of the brain shows no evidence of intracranial spread --Findings are currently consistent with metastatic small cell lung cancer with metastatic spread to the lymph nodes of the abdomen -- Started carboplatin , etoposide , and atezolizumab  on 11/13/2020. Transitioned to maintenance Atezolizumab  on 02/22/2021.  Plan: --today is Cycle 50 Day 1 of Atezolizumab  maintenance.  --labs today were reviewed and adequate for treatment.  WBC 8.0, hemoglobin 13.0, platelets 165K, creatinine stable at 1.25, liver enzymes normal. -- Most recent CT CAP from 01/09/2024 which shows no evidence of recurrence. Repeat scan due in March 2026. -- No prohibitive toxicities identified.  Proceed with treatment without any dose modifications.  --RTC in 3 weeks for a follow up before Cycle 51  # Early Cirrhosis --noted on MR abdomen from 05/22/2022. US  with elastography performed on 06/18/2022. --Most recent CT scan from 10/15/2023 showed slight enlargement of lateral left lobe of liver with questionable contour nodularity. Dr. Leigh reports repeat RUQ US  in 6 months (March 2026).  #Right renal lesion: --Noted to be benign on most recent MRI of the abdomen.  #Diarrhea -- Previously sent stool studies for C. Diff and GI pathology panel, all  negative --patient following with GI and found to have pancreatic insufficiency and started on creon .  #Nausea: --Symptoms improved with zofran  and olanzapine .  --Added IV zofran  to infusion premeds for better symptom control.  --Refill sent for Zofran  and Compazine .  #Hypokalemia:  --  currently taking potassium chloride  20 meq BID.   --potassium level is 3.8 today. Continue PO supplementation.   #Lower extremity edema --Currently takes lasix  20 mg once daily day. Refill for Lasix  sent today.  --Advised to  wear compression stockings.   #Pruritus: --Sent refill of atarax  25 mg q 6 hours as needed.   #Neuropathy involving fingers/feet: --Patient currently takes Cymbalta    #Supportive Care -- chemotherapy education complete -- port placed -- zofran  8mg  q8H PRN and compazine  10mg  PO q6H for nausea -- EMLA  cream for port  No orders of the defined types were placed in this encounter.  All questions were answered. The patient knows to call the clinic with any problems, questions or concerns.  I have spent a total of 30 minutes minutes of face-to-face and non-face-to-face time, preparing to see the patient, performing a medically appropriate examination, counseling and educating the patient, ordering medications/tests, documenting clinical information in the electronic health record, and care coordination.   Johnston Police PA-C Dept of Hematology and Oncology [facility] Phone: [phone]   02/04/2024 1:59 PM  "

## 2024-02-04 ENCOUNTER — Inpatient Hospital Stay

## 2024-02-04 ENCOUNTER — Inpatient Hospital Stay: Admitting: Physician Assistant

## 2024-02-04 ENCOUNTER — Inpatient Hospital Stay: Attending: Hematology and Oncology

## 2024-02-04 VITALS — BP 116/64 | HR 78 | Temp 97.6°F | Resp 17 | Ht 62.0 in | Wt 187.5 lb

## 2024-02-04 DIAGNOSIS — I700 Atherosclerosis of aorta: Secondary | ICD-10-CM | POA: Diagnosis not present

## 2024-02-04 DIAGNOSIS — M1612 Unilateral primary osteoarthritis, left hip: Secondary | ICD-10-CM | POA: Diagnosis not present

## 2024-02-04 DIAGNOSIS — Z8249 Family history of ischemic heart disease and other diseases of the circulatory system: Secondary | ICD-10-CM | POA: Insufficient documentation

## 2024-02-04 DIAGNOSIS — Z823 Family history of stroke: Secondary | ICD-10-CM | POA: Insufficient documentation

## 2024-02-04 DIAGNOSIS — N281 Cyst of kidney, acquired: Secondary | ICD-10-CM | POA: Diagnosis not present

## 2024-02-04 DIAGNOSIS — Z8719 Personal history of other diseases of the digestive system: Secondary | ICD-10-CM | POA: Insufficient documentation

## 2024-02-04 DIAGNOSIS — C3431 Malignant neoplasm of lower lobe, right bronchus or lung: Secondary | ICD-10-CM

## 2024-02-04 DIAGNOSIS — Z634 Disappearance and death of family member: Secondary | ICD-10-CM | POA: Diagnosis not present

## 2024-02-04 DIAGNOSIS — Z809 Family history of malignant neoplasm, unspecified: Secondary | ICD-10-CM | POA: Insufficient documentation

## 2024-02-04 DIAGNOSIS — E039 Hypothyroidism, unspecified: Secondary | ICD-10-CM | POA: Diagnosis not present

## 2024-02-04 DIAGNOSIS — E119 Type 2 diabetes mellitus without complications: Secondary | ICD-10-CM | POA: Insufficient documentation

## 2024-02-04 DIAGNOSIS — K838 Other specified diseases of biliary tract: Secondary | ICD-10-CM | POA: Diagnosis not present

## 2024-02-04 DIAGNOSIS — E785 Hyperlipidemia, unspecified: Secondary | ICD-10-CM | POA: Insufficient documentation

## 2024-02-04 DIAGNOSIS — L299 Pruritus, unspecified: Secondary | ICD-10-CM | POA: Insufficient documentation

## 2024-02-04 DIAGNOSIS — Z9841 Cataract extraction status, right eye: Secondary | ICD-10-CM | POA: Insufficient documentation

## 2024-02-04 DIAGNOSIS — Z90710 Acquired absence of both cervix and uterus: Secondary | ICD-10-CM | POA: Insufficient documentation

## 2024-02-04 DIAGNOSIS — J439 Emphysema, unspecified: Secondary | ICD-10-CM | POA: Insufficient documentation

## 2024-02-04 DIAGNOSIS — Z820 Family history of epilepsy and other diseases of the nervous system: Secondary | ICD-10-CM | POA: Insufficient documentation

## 2024-02-04 DIAGNOSIS — R42 Dizziness and giddiness: Secondary | ICD-10-CM | POA: Diagnosis not present

## 2024-02-04 DIAGNOSIS — Z886 Allergy status to analgesic agent status: Secondary | ICD-10-CM | POA: Insufficient documentation

## 2024-02-04 DIAGNOSIS — I2510 Atherosclerotic heart disease of native coronary artery without angina pectoris: Secondary | ICD-10-CM | POA: Insufficient documentation

## 2024-02-04 DIAGNOSIS — K7460 Unspecified cirrhosis of liver: Secondary | ICD-10-CM | POA: Insufficient documentation

## 2024-02-04 DIAGNOSIS — E876 Hypokalemia: Secondary | ICD-10-CM | POA: Diagnosis not present

## 2024-02-04 DIAGNOSIS — Z79890 Hormone replacement therapy (postmenopausal): Secondary | ICD-10-CM | POA: Diagnosis not present

## 2024-02-04 DIAGNOSIS — R600 Localized edema: Secondary | ICD-10-CM

## 2024-02-04 DIAGNOSIS — K8689 Other specified diseases of pancreas: Secondary | ICD-10-CM | POA: Diagnosis not present

## 2024-02-04 DIAGNOSIS — F32A Depression, unspecified: Secondary | ICD-10-CM | POA: Insufficient documentation

## 2024-02-04 DIAGNOSIS — R110 Nausea: Secondary | ICD-10-CM | POA: Diagnosis not present

## 2024-02-04 DIAGNOSIS — M419 Scoliosis, unspecified: Secondary | ICD-10-CM | POA: Insufficient documentation

## 2024-02-04 DIAGNOSIS — Z79899 Other long term (current) drug therapy: Secondary | ICD-10-CM | POA: Diagnosis not present

## 2024-02-04 DIAGNOSIS — F17210 Nicotine dependence, cigarettes, uncomplicated: Secondary | ICD-10-CM | POA: Diagnosis not present

## 2024-02-04 DIAGNOSIS — Z8261 Family history of arthritis: Secondary | ICD-10-CM | POA: Insufficient documentation

## 2024-02-04 DIAGNOSIS — Z90721 Acquired absence of ovaries, unilateral: Secondary | ICD-10-CM | POA: Insufficient documentation

## 2024-02-04 DIAGNOSIS — E278 Other specified disorders of adrenal gland: Secondary | ICD-10-CM | POA: Insufficient documentation

## 2024-02-04 DIAGNOSIS — Z5112 Encounter for antineoplastic immunotherapy: Secondary | ICD-10-CM

## 2024-02-04 DIAGNOSIS — Z811 Family history of alcohol abuse and dependence: Secondary | ICD-10-CM | POA: Insufficient documentation

## 2024-02-04 DIAGNOSIS — I10 Essential (primary) hypertension: Secondary | ICD-10-CM | POA: Insufficient documentation

## 2024-02-04 DIAGNOSIS — Z880 Allergy status to penicillin: Secondary | ICD-10-CM | POA: Insufficient documentation

## 2024-02-04 DIAGNOSIS — Z9049 Acquired absence of other specified parts of digestive tract: Secondary | ICD-10-CM | POA: Insufficient documentation

## 2024-02-04 DIAGNOSIS — Z882 Allergy status to sulfonamides status: Secondary | ICD-10-CM | POA: Insufficient documentation

## 2024-02-04 DIAGNOSIS — Z888 Allergy status to other drugs, medicaments and biological substances status: Secondary | ICD-10-CM | POA: Insufficient documentation

## 2024-02-04 DIAGNOSIS — Z9842 Cataract extraction status, left eye: Secondary | ICD-10-CM | POA: Insufficient documentation

## 2024-02-04 DIAGNOSIS — Z825 Family history of asthma and other chronic lower respiratory diseases: Secondary | ICD-10-CM | POA: Insufficient documentation

## 2024-02-04 LAB — CMP (CANCER CENTER ONLY)
ALT: 7 U/L (ref 0–44)
AST: 20 U/L (ref 15–41)
Albumin: 3.5 g/dL (ref 3.5–5.0)
Alkaline Phosphatase: 94 U/L (ref 38–126)
Anion gap: 9 (ref 5–15)
BUN: 14 mg/dL (ref 8–23)
CO2: 26 mmol/L (ref 22–32)
Calcium: 8.6 mg/dL — ABNORMAL LOW (ref 8.9–10.3)
Chloride: 105 mmol/L (ref 98–111)
Creatinine: 1.25 mg/dL — ABNORMAL HIGH (ref 0.44–1.00)
GFR, Estimated: 47 mL/min — ABNORMAL LOW
Glucose, Bld: 96 mg/dL (ref 70–99)
Potassium: 3.8 mmol/L (ref 3.5–5.1)
Sodium: 140 mmol/L (ref 135–145)
Total Bilirubin: 0.4 mg/dL (ref 0.0–1.2)
Total Protein: 6.2 g/dL — ABNORMAL LOW (ref 6.5–8.1)

## 2024-02-04 LAB — CBC WITH DIFFERENTIAL (CANCER CENTER ONLY)
Abs Immature Granulocytes: 0.03 K/uL (ref 0.00–0.07)
Basophils Absolute: 0 K/uL (ref 0.0–0.1)
Basophils Relative: 1 %
Eosinophils Absolute: 0.7 K/uL — ABNORMAL HIGH (ref 0.0–0.5)
Eosinophils Relative: 8 %
HCT: 39.3 % (ref 36.0–46.0)
Hemoglobin: 13 g/dL (ref 12.0–15.0)
Immature Granulocytes: 0 %
Lymphocytes Relative: 28 %
Lymphs Abs: 2.2 K/uL (ref 0.7–4.0)
MCH: 29.9 pg (ref 26.0–34.0)
MCHC: 33.1 g/dL (ref 30.0–36.0)
MCV: 90.3 fL (ref 80.0–100.0)
Monocytes Absolute: 0.6 K/uL (ref 0.1–1.0)
Monocytes Relative: 7 %
Neutro Abs: 4.5 K/uL (ref 1.7–7.7)
Neutrophils Relative %: 56 %
Platelet Count: 165 K/uL (ref 150–400)
RBC: 4.35 MIL/uL (ref 3.87–5.11)
RDW: 15.4 % (ref 11.5–15.5)
WBC Count: 8 K/uL (ref 4.0–10.5)
nRBC: 0 % (ref 0.0–0.2)

## 2024-02-04 LAB — TSH: TSH: 0.122 u[IU]/mL — ABNORMAL LOW (ref 0.350–4.500)

## 2024-02-04 MED ORDER — SODIUM CHLORIDE 0.9 % IV SOLN: Freq: Once | INTRAVENOUS | Status: AC

## 2024-02-04 MED ORDER — FUROSEMIDE 20 MG PO TABS: ORAL_TABLET | ORAL | 0 refills | Status: AC

## 2024-02-04 MED ORDER — ONDANSETRON HCL 4 MG/2ML IJ SOLN
8.0000 mg | Freq: Once | INTRAMUSCULAR | Status: AC
  Administered 2024-02-04: 8 mg via INTRAVENOUS
  Filled 2024-02-04: qty 4

## 2024-02-04 MED ORDER — ONDANSETRON HCL 8 MG PO TABS: 8.0000 mg | ORAL_TABLET | Freq: Two times a day (BID) | ORAL | 1 refills | Status: AC

## 2024-02-04 MED ORDER — SODIUM CHLORIDE 0.9 % IV SOLN
1200.0000 mg | Freq: Once | INTRAVENOUS | Status: AC
  Administered 2024-02-04: 1200 mg via INTRAVENOUS
  Filled 2024-02-04: qty 20

## 2024-02-04 MED ORDER — HYDROXYZINE HCL 25 MG PO TABS: 25.0000 mg | ORAL_TABLET | Freq: Four times a day (QID) | ORAL | 2 refills | Status: AC | PRN

## 2024-02-04 MED ORDER — PROCHLORPERAZINE MALEATE 10 MG PO TABS: 10.0000 mg | ORAL_TABLET | Freq: Four times a day (QID) | ORAL | 0 refills | Status: AC | PRN

## 2024-02-04 NOTE — Patient Instructions (Signed)
 [facility] - A DEPT OF [facility]  Discharge Instructions: Thank you for choosing [facility] to provide your oncology and hematology care.   If you have a lab appointment with the Cancer Center, please go directly to the Cancer Center and check in at the registration area.   Wear comfortable clothing and clothing appropriate for easy access to any Portacath or PICC line.   We strive to give you quality time with your provider. You may need to reschedule your appointment if you arrive late (15 or more minutes).  Arriving late affects you and other patients whose appointments are after yours.  Also, if you miss three or more appointments without notifying the office, you may be dismissed from the clinic at the provider's discretion.      For prescription refill requests, have your pharmacy contact our office and allow 72 hours for refills to be completed.    Today you received the following chemotherapy and/or immunotherapy agents: Atezolizumab  (Tecentriq )    To help prevent nausea and vomiting after your treatment, we encourage you to take your nausea medication as directed.  BELOW ARE SYMPTOMS THAT SHOULD BE REPORTED IMMEDIATELY: *FEVER GREATER THAN 100.4 F (38 C) OR HIGHER *CHILLS OR SWEATING *NAUSEA AND VOMITING THAT IS NOT CONTROLLED WITH YOUR NAUSEA MEDICATION *UNUSUAL SHORTNESS OF BREATH *UNUSUAL BRUISING OR BLEEDING *URINARY PROBLEMS (pain or burning when urinating, or frequent urination) *BOWEL PROBLEMS (unusual diarrhea, constipation, pain near the anus) TENDERNESS IN MOUTH AND THROAT WITH OR WITHOUT PRESENCE OF ULCERS (sore throat, sores in mouth, or a toothache) UNUSUAL RASH, SWELLING OR PAIN  UNUSUAL VAGINAL DISCHARGE OR ITCHING   Items with * indicate a potential emergency and should be followed up as soon as possible or go to the Emergency Department if any problems should occur.  Please show the CHEMOTHERAPY ALERT CARD or  IMMUNOTHERAPY ALERT CARD at check-in to the Emergency Department and triage nurse.  Should you have questions after your visit or need to cancel or reschedule your appointment, please contact [facility] - A DEPT OF [facility]  Dept: [phone]  and follow the prompts.  Office hours are 8:00 a.m. to 4:30 p.m. Monday - Friday. Please note that voicemails left after 4:00 p.m. may not be returned until the following business day.  We are closed weekends and major holidays. You have access to a nurse at all times for urgent questions. Please call the main number to the clinic Dept: [phone] and follow the prompts.   For any non-urgent questions, you may also contact your provider using MyChart. We now offer e-Visits for anyone 53 and older to request care online for non-urgent symptoms. For details visit [email or web address].   Also download the MyChart app! Go to the app store, search MyChart, open the app, select [facility], and log in with your MyChart username and password.

## 2024-02-05 LAB — T4: T4, Total: 10.2 ug/dL (ref 4.5–12.0)

## 2024-02-06 ENCOUNTER — Encounter: Payer: Self-pay | Admitting: Physical Medicine and Rehabilitation

## 2024-02-06 ENCOUNTER — Encounter: Attending: Physical Medicine and Rehabilitation | Admitting: Physical Medicine and Rehabilitation

## 2024-02-06 ENCOUNTER — Encounter: Payer: Self-pay | Admitting: Hematology and Oncology

## 2024-02-06 VITALS — Ht 62.0 in | Wt 194.0 lb

## 2024-02-06 DIAGNOSIS — C3490 Malignant neoplasm of unspecified part of unspecified bronchus or lung: Secondary | ICD-10-CM | POA: Insufficient documentation

## 2024-02-06 DIAGNOSIS — G894 Chronic pain syndrome: Secondary | ICD-10-CM | POA: Insufficient documentation

## 2024-02-06 DIAGNOSIS — E039 Hypothyroidism, unspecified: Secondary | ICD-10-CM | POA: Insufficient documentation

## 2024-02-06 DIAGNOSIS — Z720 Tobacco use: Secondary | ICD-10-CM | POA: Diagnosis not present

## 2024-02-06 DIAGNOSIS — S069X1A Unspecified intracranial injury with loss of consciousness of 30 minutes or less, initial encounter: Secondary | ICD-10-CM | POA: Diagnosis not present

## 2024-02-06 DIAGNOSIS — S069XAA Unspecified intracranial injury with loss of consciousness status unknown, initial encounter: Secondary | ICD-10-CM | POA: Insufficient documentation

## 2024-02-06 MED ORDER — HYDROCODONE-ACETAMINOPHEN 10-325 MG PO TABS: 1.0000 | ORAL_TABLET | ORAL | 0 refills | Status: AC | PRN

## 2024-02-06 MED ORDER — BUPRENORPHINE 7.5 MCG/HR TD PTWK: 1.0000 | MEDICATED_PATCH | TRANSDERMAL | 5 refills | Status: AC

## 2024-02-06 MED ORDER — LEVOTHYROXINE SODIUM 112 MCG PO TABS: 112.0000 ug | ORAL_TABLET | Freq: Every day | ORAL | 1 refills | Status: AC

## 2024-02-06 NOTE — Progress Notes (Signed)
 "  Subjective:    Patient ID: Boby ONEIDA Misty, female    DOB: 01-13-1956, 69 y.o.   MRN: [id number]  HPI   Pt is a 69 yr old female with hx of newly dx'd COPD, still smoking, HTN, DM- diet controlled; anxiety and depression;  Scoliosis and deg back issues,  Generalized OA;  chronic pain- here for f/u of chronic pain issues. With new 2.2 cm R lower pulmonary nodule and hemorrhoids.  Now has dx of Small cell lung cancer extensive stage 4 per oncology note. Here for f/u on chronic pain.     Had a fall and kissed concrete 3 weeks ago- cannot remember anything since then.  Remembers falling-  At [employer] -  feet got tangled up-  last thing she remembers, I saw her hit her head, I saw it bounce.  Then doesn't remember after that.   Had password for phone on hand because memory so bad.  Cannot remember it.  HA's-  having HA's- daily-  frontal HA's- sharp stabbing- never had HA's like this before-  Seeing a lot of light flashing-  cannot tell me if on one side or not, but it's maybe more to right side.    Hx of migraines- always on L side. Not like migraines.  Migraines go away, this HA doesn't- gnawing at her.  Light sensitivity but not sound sensitivity- but has hearing loss.  No nausea.   Also had diarrhea badly- 3 days ago.  Nothing is stopping it.  Imodium  doesn't touch it.    Doesn't thin has had ABX lately, but cannot remember.  Try to send texts, but cannot get into phone.  Also severe fatigue- slept ALL day yesterday.  Felt better when woke up.  Slept so much this AM, but didn't remember had doctor's appt until Husband reminded her.     Sounds like has a bad concussion.    Was put on Gabapentin by PCP-   12/9-  was a miracle drug initially- but after that, made her confused.  100 mg BID-  stopped after 1 couple weeks.   Feels better from Norco than Dilaudid - dilaudid  didn't help   Pain Inventory Average Pain 8 Pain Right Now 8 My pain is constant, sharp,  burning, stabbing, and aching  In the last 24 hours, has pain interfered with the following? General activity 10 Relation with others 10 Enjoyment of life 10 What TIME of day is your pain at its worst? morning  and night Sleep (in general) Fair  Pain is worse with: bending, standing, and some activites Pain improves with: rest, medication, and injections Relief from Meds: 7  Family History  Problem Relation Age of Onset   COPD Mother    Heart disease Mother    Lung disease Father        Asbestosis   Heart attack Father    Heart disease Father    Cerebral aneurysm Brother    Aneurysm Brother        Brain   Drug abuse Daughter    Epilepsy Son    Alcohol abuse Son    Drug abuse Son    Arthritis Maternal Grandmother    Heart disease Maternal Grandmother    Asthma Maternal Grandfather    Cancer Maternal Grandfather    Arthritis Paternal Grandmother    Heart disease Paternal Grandmother    Stroke Paternal Grandmother    Early death Paternal Grandfather    Heart disease Paternal Grandfather  Social History   Socioeconomic History   Marital status: Married    Spouse name: Not on file   Number of children: 2   Years of education: Not on file   Highest education level: 12th grade  Occupational History   Occupation: disabled   Occupation: disabled  Tobacco Use   Smoking status: Every Day    Current packs/day: 2.00    Average packs/day: 2.0 packs/day for 46.0 years (92.0 ttl pk-yrs)    Types: Cigarettes   Smokeless tobacco: Never   Tobacco comments:    2 packs of cigarettes smoked daily 12/13/21- declines smoking cessation  Vaping Use   Vaping status: Never Used  Substance and Sexual Activity   Alcohol use: No   Drug use: No   Sexual activity: Not Currently    Partners: Male  Other Topics Concern   Not on file  Social History Narrative   Right handed    Caffeine~ 2 cups per day    Lives at home with husband (strained relationship)   Primary caretaker for  disabled brother who had aneurism   Daughter died Jun 26, 2018   Social Drivers of Health   Tobacco Use: High Risk (12/30/2023)   Patient History    Smoking Tobacco Use: Every Day    Smokeless Tobacco Use: Never    Passive Exposure: Not on file  Financial Resource Strain: Low Risk (07/31/2023)   Received [facility]   Overall Financial Resource Strain (CARDIA)    How hard is it for you to pay for the very basics like food, housing, medical care, and heating?: Not very hard  Food Insecurity: No Food Insecurity (07/31/2023)   Received from [facility]   Epic    Within the past 12 months, you worried that your food would run out before you got the money to buy more.: Never true    Within the past 12 months, the food you bought just didn't last and you didn't have money to get more.: Never true  Recent Concern: Food Insecurity - Food Insecurity Present (05/20/2023)   Hunger Vital Sign    Worried About Running Out of Food in the Last Year: Sometimes true    Ran Out of Food in the Last Year: Sometimes true  Transportation Needs: No Transportation Needs (07/31/2023)   Received from [facility]   PRAPARE - Transportation    Lack of Transportation (Medical): No    Lack of Transportation (Non-Medical): No  Physical Activity: Inactive (05/20/2023)   Exercise Vital Sign    Days of Exercise per Week: 0 days    Minutes of Exercise per Session: 0 min  Stress: No Stress Concern Present (05/20/2023)   [employer] of Occupational [employer] - Occupational Stress Questionnaire    Feeling of Stress : Only a little  Recent Concern: Stress - Stress Concern Present (03/31/2023)   [employer] of Occupational [employer] - Occupational Stress Questionnaire    Feeling of Stress : Very much  Social Connections: Moderately Integrated (05/20/2023)   Social Connection and Isolation Panel    Frequency of Communication with Friends and Family: More than three times a week    Frequency of Social  Gatherings with Friends and Family: More than three times a week    Attends Religious Services: 1 to 4 times per year    Active Member of [employer] or Organizations: No    Attends Banker Meetings: Never    Marital Status: Married  Depression (PHQ2-9):  Low Risk (02/06/2024)   Depression (PHQ2-9)    PHQ-2 Score: 0  Recent Concern: Depression (PHQ2-9) - Medium Risk (12/24/2023)   Depression (PHQ2-9)    PHQ-2 Score: 5  Alcohol Screen: Low Risk (05/20/2023)   Alcohol Screen    Last Alcohol Screening Score (AUDIT): 0  Housing: Low Risk (05/20/2023)   Housing Stability Vital Sign    Unable to Pay for Housing in the Last Year: No    Number of Times Moved in the Last Year: 0    Homeless in the Last Year: No  Utilities: Low Risk (07/31/2023)   Received from [facility]   Utilities    Within the past 12 months, have you been unable to get utilities(heat, electricity) when it was really needed?: No  Health Literacy: Adequate Health Literacy (05/20/2023)   B1300 Health Literacy    Frequency of need for help with medical instructions: Never   Past Surgical History:  Procedure Laterality Date   APPENDECTOMY     1985   BIOPSY  07/24/2017   Procedure: BIOPSY;  Surgeon: [name], MD;  Location: [facility];  Service: Endoscopy;;   CARDIAC CATHETERIZATION N/A 10/31/2015   Procedure: Left Heart Cath and Coronary Angiography;  Surgeon: Alm LELON Clay, MD;  Location: [facility] INVASIVE CV LAB;  Service: Cardiovascular;  Laterality: N/A;   CARPAL TUNNEL RELEASE Left    CARPAL TUNNEL RELEASE Right    CATARACT EXTRACTION W/PHACO Left 02/07/2023   Procedure: CATARACT EXTRACTION PHACO AND INTRAOCULAR LENS PLACEMENT (IOC);  Surgeon: Juli Blunt, MD;  Location: [facility];  Service: Ophthalmology;  Laterality: Left;  CDE: 3.51   CATARACT EXTRACTION W/PHACO Right 02/21/2023   Procedure: CATARACT EXTRACTION PHACO AND INTRAOCULAR LENS PLACEMENT (IOC);  Surgeon: Juli Blunt, MD;  Location: [facility];  Service: Ophthalmology;  Laterality: Right;  CDE: 2.94   CHOLECYSTECTOMY  late 1980's   COLONOSCOPY WITH PROPOFOL  N/A 07/24/2017   Procedure: COLONOSCOPY WITH PROPOFOL ;  Surgeon: [name], MD;  Location: [facility];  Service: Endoscopy;  Laterality: N/A;   ESOPHAGOGASTRODUODENOSCOPY N/A 07/24/2017   Procedure: ESOPHAGOGASTRODUODENOSCOPY (EGD);  Surgeon: [name], MD;  Location: [facility];  Service: Endoscopy;  Laterality: N/A;   ESOPHAGOGASTRODUODENOSCOPY (EGD) WITH PROPOFOL  N/A 10/19/2020   Procedure: ESOPHAGOGASTRODUODENOSCOPY (EGD) WITH PROPOFOL ;  Surgeon: [name], MD;  [facility];  Service: Endoscopy;  Laterality: N/A;   FINE NEEDLE ASPIRATION N/A 10/19/2020   Procedure: FINE NEEDLE ASPIRATION (FNA) LINEAR;  Surgeon: [name], MD;  [facility];  Service: Endoscopy;  Laterality: N/A;   GALLBLADDER SURGERY  1991   HIP CLOSED REDUCTION Right 01/08/2016   Procedure: CLOSED MANIPULATION HIP;  Surgeon: Reyes Billing, MD;  [facility]: [facility];  Service: Orthopedics;  Laterality: Right;   HIP CLOSED REDUCTION Right 01/19/2016   Procedure: ATTEMPTED CLOSED REDUCTION RIGHT HIP;  Surgeon: Norleen Armor, MD;  [facility];  Service: Orthopedics;  Laterality: Right;   HIP CLOSED REDUCTION Right 01/20/2016   Procedure: CLOSED REDUCTION RIGHT TOTAL HIP;  Surgeon: Donnice Car, MD;  [facility];  Service: Orthopedics;  Laterality: Right;   HIP CLOSED REDUCTION Right 02/17/2016   Procedure: CLOSED REDUCTION RIGHT TOTAL HIP;  Surgeon: Redell Shoals, MD;  [facility]: [facility];  Service: Orthopedics;  Laterality: Right;   HIP CLOSED REDUCTION Right 02/28/2016   Procedure: CLOSED REDUCTION HIP;  Surgeon: Selinda Belvie Gosling, MD;  [facility];  Service: Orthopedics;  Laterality: Right;   IR IMAGING GUIDED PORT INSERTION  11/01/2020   POLYPECTOMY  07/24/2017   Procedure: POLYPECTOMY;  Surgeon: [name], MD;  [facility];   Service: Endoscopy;;   TONSILLECTOMY     TOTAL ABDOMINAL HYSTERECTOMY     1985, with 1 ovary removed and 2 nd ovary removed 2003   TOTAL HIP ARTHROPLASTY Right    Original surgery 2006 with revision 2010   TOTAL HIP REVISION Right 01/01/2016   Procedure: TOTAL HIP REVISION;  Surgeon: Donnice Car, MD;  [facility];  Service: Orthopedics;  Laterality: Right;   TOTAL HIP REVISION Right 03/02/2016   Procedure: TOTAL HIP REVISION;  Surgeon: Car Donnice, MD;  [facility];  Service: Orthopedics;  Laterality: Right;   TOTAL HIP REVISION Right 09/02/2016   Procedure: Right hip constrained liner- posterior;  Surgeon: Car Donnice, MD;  [facility];  Service: Orthopedics;  Laterality: Right;   ULNAR NERVE TRANSPOSITION Right    UPPER ESOPHAGEAL ENDOSCOPIC ULTRASOUND (EUS) N/A 10/19/2020   Procedure: UPPER ESOPHAGEAL ENDOSCOPIC ULTRASOUND (EUS);  Surgeon: [name], MD;  Location: [facility];  Service: Endoscopy;  Laterality: N/A;  periduodenal lesion   Past Surgical History:  Procedure Laterality Date   APPENDECTOMY     1985   BIOPSY  07/24/2017   Procedure: BIOPSY;  Surgeon: [name], MD;  [facility];  Service: Endoscopy;;   CARDIAC CATHETERIZATION N/A 10/31/2015   Procedure: Left Heart Cath and Coronary Angiography;  Surgeon: Alm LELON Clay, MD;  Location[facility] INVASIVE CV LAB;  Service: Cardiovascular;  Laterality: N/A;   CARPAL TUNNEL RELEASE Left    CARPAL TUNNEL RELEASE Right    CATARACT EXTRACTION W/PHACO Left 02/07/2023   Procedure: CATARACT EXTRACTION PHACO AND INTRAOCULAR LENS PLACEMENT (IOC);  Surgeon: Juli Blunt, MD;  [facility]: [facility];  Service: Ophthalmology;  Laterality: Left;  CDE: 3.51   CATARACT EXTRACTION W/PHACO Right 02/21/2023   Procedure: CATARACT EXTRACTION PHACO AND INTRAOCULAR LENS PLACEMENT (IOC);  Surgeon: Juli Blunt, MD;  [facility]: [facility];  Service: Ophthalmology;  Laterality: Right;  CDE: 2.94   CHOLECYSTECTOMY  late  1980's   COLONOSCOPY WITH PROPOFOL  N/A 07/24/2017   Procedure: COLONOSCOPY WITH PROPOFOL ;  Surgeon: [name], MD;  [facility];  Service: Endoscopy;  Laterality: N/A;   ESOPHAGOGASTRODUODENOSCOPY N/A 07/24/2017   Procedure: ESOPHAGOGASTRODUODENOSCOPY (EGD);  Surgeon: [name], MD;  Location: [facility];  Service: Endoscopy;  Laterality: N/A;   ESOPHAGOGASTRODUODENOSCOPY (EGD) WITH PROPOFOL  N/A 10/19/2020   Procedure: ESOPHAGOGASTRODUODENOSCOPY (EGD) WITH PROPOFOL ;  Surgeon: [name], MD;  [facility];  Service: Endoscopy;  Laterality: N/A;   FINE NEEDLE ASPIRATION N/A 10/19/2020   Procedure: FINE NEEDLE ASPIRATION (FNA) LINEAR;  Surgeon: [name], MD;  [facility];  Service: Endoscopy;  Laterality: N/A;   GALLBLADDER SURGERY  1991   HIP CLOSED REDUCTION Right 01/08/2016   Procedure: CLOSED MANIPULATION HIP;  Surgeon: Reyes Billing, MD;  [facility];  Service: Orthopedics;  Laterality: Right;   HIP CLOSED REDUCTION Right 01/19/2016   Procedure: ATTEMPTED CLOSED REDUCTION RIGHT HIP;  Surgeon: Norleen Armor, MD;  [facility];  Service: Orthopedics;  Laterality: Right;   HIP CLOSED REDUCTION Right 01/20/2016   Procedure: CLOSED REDUCTION RIGHT TOTAL HIP;  Surgeon: Donnice Car, MD;  [facility];  Service: Orthopedics;  Laterality: Right;   HIP CLOSED REDUCTION Right 02/17/2016   Procedure: CLOSED REDUCTION RIGHT TOTAL HIP;  Surgeon: Redell Shoals, MD;  [facility];  Service: Orthopedics;  Laterality: Right;  HIP CLOSED REDUCTION Right 02/28/2016   Procedure: CLOSED REDUCTION HIP;  Surgeon: Selinda Belvie Gosling, MD;  [facility];  Service: Orthopedics;  Laterality: Right;   IR IMAGING GUIDED PORT INSERTION  11/01/2020   POLYPECTOMY  07/24/2017   Procedure: POLYPECTOMY;  Surgeon: [name], MD;  [facility];  Service: Endoscopy;;   TONSILLECTOMY     TOTAL ABDOMINAL HYSTERECTOMY     1985, with 1  ovary removed and 2 nd ovary removed 2003   TOTAL HIP ARTHROPLASTY Right    Original surgery 2006 with revision 2010   TOTAL HIP REVISION Right 01/01/2016   Procedure: TOTAL HIP REVISION;  Surgeon: Donnice Car, MD;  [facility];  Service: Orthopedics;  Laterality: Right;   TOTAL HIP REVISION Right 03/02/2016   Procedure: TOTAL HIP REVISION;  Surgeon: Car Donnice, MD;  [facility];  Service: Orthopedics;  Laterality: Right;   TOTAL HIP REVISION Right 09/02/2016   Procedure: Right hip constrained liner- posterior;  Surgeon: Car Donnice, MD;  [facility];  Service: Orthopedics;  Laterality: Right;   ULNAR NERVE TRANSPOSITION Right    UPPER ESOPHAGEAL ENDOSCOPIC ULTRASOUND (EUS) N/A 10/19/2020   Procedure: UPPER ESOPHAGEAL ENDOSCOPIC ULTRASOUND (EUS);  Surgeon: [name], MD;  Location: [facility];  Service: Endoscopy;  Laterality: N/A;  periduodenal lesion   Past Medical History:  Diagnosis Date   Allergic rhinitis    Anemia    Anxiety    Chicken pox    Chronic back pain    COPD (chronic obstructive pulmonary disease) (HCC)    Depression    DM (diabetes mellitus) (HCC)    Essential hypertension    GERD (gastroesophageal reflux disease)    Headache    migraines   History of gastritis    EGD 2015   History of home oxygen  therapy    2 liters at hs last 6 months   Hyperlipidemia    Hypothyroidism    Migraines    Osteoarthritis    oa   Scoliosis    Small cell lung cancer (HCC)    Stage 4   Ht 5' 2 (1.575 m)   Wt 194 lb (88 kg)   BMI 35.48 kg/m   Opioid Risk Score:   Fall Risk Score:  `1  Depression screen PHQ 2/9     02/06/2024    1:33 PM 12/24/2023   11:00 AM 12/03/2023   11:21 AM 11/26/2023   11:13 AM 10/21/2023   12:00 PM 10/20/2023    2:54 PM 09/30/2023    1:01 PM  Depression screen PHQ 2/9  Decreased Interest 0 2 3 1  0 0 0  Down, Depressed, Hopeless 0 2 3 1  0 0 0  PHQ - 2 Score 0 4 6 2  0 0 0  Altered sleeping  0 0      Tired, decreased  energy  1       Change in appetite  0       Feeling bad or failure about yourself   0       Trouble concentrating  0       PHQ-9 Score  5 6          Data saved with a previous flowsheet row definition    Review of Systems  Musculoskeletal:  Positive for back pain and gait problem.       Pain in knees, back, elbows, shoulders  All other systems reviewed and are negative.      Objective:  Physical Exam  Awake, alert, but doesn't remember timing- usually great historian- cannot do today at all- has bruising all over legs and R knee, NAD C/O HA- and TTP over front of head  Naming 3/3-repetition is OK, but very poor memory- 0/3 with cues at 5 minutes Bad chronic cough- still smoking/smells of smoke Delayed repsonses      Assessment & Plan:   Pt is a 69 yr old female with hx of newly dx'd COPD, still smoking, HTN, DM- diet controlled; anxiety and depression;  Scoliosis and deg back issues,  Generalized OA;  chronic pain- here for f/u of chronic pain issues. With new 2.2 cm R lower pulmonary nodule and hemorrhoids.  Now has dx of Small cell lung cancer extensive stage 4 per oncology note. Here for f/u on chronic pain.   New mild brain injury  Fell and hit head- has a bad concussion- will order head CT as well  Concern for possible SDH_ severe daily HA's, severe fatigue and memory changes since fall 3 weeks ago where she hit her head and had LOC (+)- ordered at [facility]- [address]- not far from the office. They will call you -  hopefully can get early next week-  BUT IF Symptoms get worse, GO TO ER.   2.   There's no memory medications that will help-  needs SLEEP- can be 16-18 hours/day- that will make a difference- if you get a Headache, go BACK to bed. Sleep is the Number 1 healer for concussion If oyu wake up and get a Headache, then GO back to sleep.   3.   I think it's the concussion  is the reason you cannot tolerate gabapentin- keep it!!  It was 100 mg 2x/day-  but keep to use maybe after recovered from concussion, etc.   (Was remarkable for pain)-  Can try 100 mg at bedtime, but if memory doesn't getting better, need to stop.   4. This has nothing to do with Alzheimer's.    5.  No anti- inflammatories-  Ibuprofen, Aleve, Motrin,  Goody powder- once Head CT comes back- then if it's OK, then will let you get back on them as needed, but don't want to chance brain bleed.     6.   Will switch back to Norco/hydrocodone -10/325 mg q4 hours prn- BUT our goal - to go up maybe on Butrans  and down on amount/timing/frequency of Norco over time,   7. Will add butrans  patch-7.5 mcg and replace 1x/week- with 4 patches and 5 refills   - Call me in 1-2 weeks ot let me know how it's working    8.   Pt needs to refill Synthroid  changed to 112 mcg daily- know if you don't take, can make you foggy/brain not working as well  Sent in for 1 year.    9. F/U in 2 months with Fidela and 4 months with me- Needs TSH checked at next visit with [facility].    10. Wait on Shingles vaccine until mild TBI is doing better  I spent a total of  43  minutes on total care today- >50% coordination of care- due to d/w pt about her Mild TBI and dx'ding that- also thyroid  issues and chronic pain making major changes to regimen-   "

## 2024-02-06 NOTE — Patient Instructions (Signed)
 Pt is a 69 yr old female with hx of newly dx'd COPD, still smoking, HTN, DM- diet controlled; anxiety and depression;  Scoliosis and deg back issues,  Generalized OA;  chronic pain- here for f/u of chronic pain issues. With new 2.2 cm R lower pulmonary nodule and hemorrhoids.  Now has dx of Small cell lung cancer extensive stage 4 per oncology note. Here for f/u on chronic pain.   New mild brain injury  Fell and hit head- has a bad concussion- will order head CT as well  Concern for possible SDH_ severe daily HA's, severe fatigue and memory changes since fall 3 weeks ago where she hit her head and had LOC (+)- ordered at [facility]- [address] not far from the office. They will call you -  hopefully can get early next week-  BUT IF Symptoms get worse, GO TO ER.   2.   There's no memory medications that will help-  needs SLEEP- can be 16-18 hours/day- that will make a difference- if you get a Headache, go BACK to bed. Sleep is the Number 1 healer for concussion If oyu wake up and get a Headache, then GO back to sleep.   3.   I think it's the concussion  is the reason you cannot tolerate gabapentin- keep it!!  It was 100 mg 2x/day- but keep to use maybe after recovered from concussion, etc.   (Was remarkable for pain)-  Can try 100 mg at bedtime, but if memory doesn't getting better, need to stop.   4. This has nothing to do with Alzheimer's.    5.  No anti- inflammatories-  Ibuprofen, Aleve, Motrin,  Goody powder- once Head CT comes back- then if it's OK, then will let you get back on them as needed, but don't want to chance brain bleed.     6.   Will switch back to Norco/hydrocodone -10/325 mg q4 hours prn- BUT our goal - to go up maybe on Butrans  and down on amount/timing/frequency of Norco over time,   7. Will add butrans  patch-7.5 mcg and replace 1x/week- with 4 patches and 5 refills   - Call me in 1-2 weeks ot let me know how it's working    8.   Pt needs to refill  Synthroid  changed to 112 mcg daily- know if you don't take, can make you foggy/brain not working as well  Sent in for 1 year.    9. F/U in 2 months with Fidela and 4 months with me- Needs TSH checked at next visit with [facility].    10. Wait on Shingles vaccine until mild TBI is doing better

## 2024-02-07 ENCOUNTER — Other Ambulatory Visit: Payer: Self-pay

## 2024-02-09 ENCOUNTER — Encounter: Payer: Self-pay | Admitting: Hematology and Oncology

## 2024-02-09 ENCOUNTER — Inpatient Hospital Stay: Admission: RE | Admit: 2024-02-09 | Source: Ambulatory Visit

## 2024-02-10 ENCOUNTER — Telehealth: Payer: Self-pay

## 2024-02-10 NOTE — Telephone Encounter (Signed)
 Rumaysa Sadler (Key: [id number]) Rx #: [id number] Need Help? Call us  at [phone] Outcome Additional Information Required The patient currently has access to the requested medication and a Prior Authorization is not needed for the patient/medication. Drug HYDROcodone -Acetaminophen  10-325MG  tablets ePA cloud Psychologist, Educational Medicare Electronic PA Form (2017 NCPDP) Original Claim Info [phone] DISPENSE MAX 7 DAY SUPPLY; OR RESUBMITWITH SCC 10 ONLY IF PATIENT HAS OPIOIDRX HISTORY, CANCER-RELATED PAIN, SICKLECELL, PALLIATIVE CARE, HOSPICE; OR [phone]. For RxLocal Coupon Price of: $62.24 submit to BIN: [id number] PCN: CP Group: COUPON --Service provided at no cost and no switch fee to the pharmacy--

## 2024-02-11 ENCOUNTER — Inpatient Hospital Stay
Admission: RE | Admit: 2024-02-11 | Discharge: 2024-02-11 | Attending: Physical Medicine and Rehabilitation | Admitting: Physical Medicine and Rehabilitation

## 2024-02-11 DIAGNOSIS — S069X1A Unspecified intracranial injury with loss of consciousness of 30 minutes or less, initial encounter: Secondary | ICD-10-CM

## 2024-02-20 NOTE — Telephone Encounter (Signed)
 PA in cover my meds requesting prior authorization on Hydrocodone .  Note on PA request states patient has access to medication and PA not required.  Called [facility] to find out more information regarding PA request.  Per Pharmacist patient picked up 180 tablets on 02/06/24.  PA request has been archived in Cover My Meds due to medication being picked up on 02/06/24 and PA is no longer needed for January's rx.

## 2024-02-24 ENCOUNTER — Other Ambulatory Visit: Payer: Self-pay | Admitting: Physician Assistant

## 2024-02-24 NOTE — Progress Notes (Signed)
 Rescheduled due to snowstorm

## 2024-02-25 ENCOUNTER — Telehealth: Payer: Self-pay | Admitting: Hematology and Oncology

## 2024-02-25 ENCOUNTER — Inpatient Hospital Stay: Admitting: Dietician

## 2024-02-25 ENCOUNTER — Inpatient Hospital Stay

## 2024-02-25 ENCOUNTER — Inpatient Hospital Stay: Admitting: Hematology and Oncology

## 2024-02-25 DIAGNOSIS — C3431 Malignant neoplasm of lower lobe, right bronchus or lung: Secondary | ICD-10-CM

## 2024-02-25 DIAGNOSIS — Z95828 Presence of other vascular implants and grafts: Secondary | ICD-10-CM

## 2024-02-25 DIAGNOSIS — Z5112 Encounter for antineoplastic immunotherapy: Secondary | ICD-10-CM

## 2024-02-29 NOTE — Assessment & Plan Note (Signed)
#   Small Cell Lung Cancer, Extensive Stage 07/05/2020: CT abdomen for lower abdominal pain. New pulmonary nodular density noted 07/06/2020: CT chest showed 2.2 cm macrolobulated right lower lobe pulmonary nodule (favored) versus pathologically enlarged infrahilar lymph node 10/13/2020: PET CT scan performed, findings show 2 cm right lower lobe lung mass is hypermetabolic and consistent with primary lung neoplasm. Additionally found hypermetabolic 17 mm soft tissue lesion between the descending duodenum and the pancreatic head  10/19/2020: EGD to biopsy hypermetabolic lymph node. Biopsy results show small cell lung cancer 10/26/2020: establish care with Dr. Federico  11/13/2020: Cycle 1 Day 1 of Carbo/Etop/Atezolizumab  12/04/2020: Cycle 2 Day 1 of Carbo/Etop/Atezolizumab  11/22-11/25/2022: admitted for E. Coli bacteremia/sepsis. Start of Cycle 3 delayed. 01/02/2021: Cycle 3 Day 1 of Carbo/Etop/Atezolizumab  01/23/2021: Cycle 4 Day 1 of Carbo/Etop/Atezolizumab  02/22/2021: Cycle 5 Day 1 of Atezolizumab  Maintenance. Delayed start due to patient's COVID infection.  03/14/2021: Cycle 6 Day 1 of Atezolizumab  Maintenance 04/05/2021: Cycle 7 Day 1 of Atezolizumab  Maintenance 04/25/2021: Cycle 8 Day 1 of Atezolizumab  Maintenance 05/16/2021: Cycle 9 Day 1 of Atezolizumab  Maintenance 06/06/2021: Cycle 10 Day 1 of Atezolizumab  Maintenance 06/27/2021:  Cycle 11 Day 1 of Atezolizumab  Maintenance 07/18/2021: Cycle 12 Day 1 of Atezolizumab  Maintenance 08/08/2021: Cycle 13 Day 1 of Atezolizumab  Maintenance 08/29/2021: Cycle 14 Day 1 of Atezolizumab  Maintenance 09/19/2021: Cycle 15 Day 1 of Atezolizumab  Maintenance 10/10/2021: Cycle 16 Day 1 of Atezolizumab  Maintenance 10/31/2021: Cycle 17 Day 1 of Atezolizumab  Maintenance 11/21/2021: Cycle 18 Day 1 of Atezolizumab  Maintenance 12/12/2021: Cycle 19 Day 1 of Atezolizumab  Maintenance 01/02/2022: Cycle 20 Day 1 of Atezolizumab  Maintenance 01/23/2022: Cycle 21 Day 1 of Atezolizumab   Maintenance 02/13/2022: treatment HELD due to diarrhea.  03/06/2022: Cycle 22 Day 1 of Atezolizumab  Maintenance 03/27/2022: Cycle 23 Day 1 of Atezolizumab  Maintenance 04/17/2022: Cycle 24 Day 1 of Atezolizumab  Maintenance 05/08/2022:  Cycle 25 Day 1 of Atezolizumab  Maintenance (Held due to patient preference for treatment holiday) 05/29/2022: Cycle 25 Day 1 of Atezolizumab  Maintenance  06/19/2022: Cycle 26 Day 1 of Atezolizumab  Maintenance  07/12/2022: Cycle 27 Day 1 of Atezolizumab  Maintenance 07/31/2022: Cycle 28 Day 1 of Atezolizumab  Maintenance HELD per patient request for fatigue.  08/22/2022: Cycle 28 Day 1 of Atezolizumab  Maintenance 09/11/2022: Cycle 29 Day 1 of Atezolizumab  Maintenance 10/03/2022: Cycle 30 Day 1 of Atezolizumab  Maintenance 10/23/2022: Cycle 31 Day 1 of Atezolizumab  Maintenance 11/13/2022: Cycle 32 Day 1 of Atezolizumab  Maintenance 12/24/2022: Cycle 33 Day 1 of Atezolizumab  Maintenance 01/15/2023: Cycle 34 Day 1 of Atezolizumab  Maintenance 02/05/2023: Cycle 35 Day 1 of Atezolizumab  Maintenance 02/26/2023: Cycle 36 Day 1 of Atezolizumab  Maintenance 04/28/2023: Cycle 37 Day 1 of Atezolizumab  Maintenance 04/30/2023: Cycle 38 Day 1 of Atezolizumab  Maintenance 05/21/2023: Cycle 39 Day 1 of Atezolizumab  Maintenance 06/11/2023: Cycle 40 Day 1 of Atezolizumab  Maintenance 07/28/2023: Cycle 41 Day 1 of Atezolizumab  Maintenance 08/18/2023: Cycle 42 Day 1 of Atezolizumab  Maintenance 09/08/2023: Cycle 43 Day 1 of Atezolizumab  Maintenance 09/30/2023:  Cycle 44 Day 1 of Atezolizumab  Maintenance 10/21/2023: Cycle 45 Day 1 of Atezolizumab  Maintenance 11/11/2023: Cycle 46 Day 1 of Atezolizumab  Maintenance 12/03/2023: Cycle 47 Day 1 of Atezolizumab  Maintenance 12/24/2023: Cycle 48 Day 1 of Atezolizumab  Maintenance 01/14/2024: Cycle 49 Day 1 of Atezolizumab  Maintenance 02/04/2023: Cycle 50 Day 1 of Atezolizumab  Maintenance

## 2024-02-29 NOTE — Progress Notes (Unsigned)
 " Patient Care Team: [name], FNP as PCP - General (Family Medicine) O'Neal, Darryle Ned, MD as PCP - Cardiology (Cardiology) [name], MD (Inactive) as Attending Physician (Gastroenterology) [name], MD as Consulting Physician (Orthopedic Surgery) [name], MD as Consulting Physician (Orthopedic Surgery) [name], MD (Inactive) as Consulting Physician (Neurology) [name], MD as Consulting Physician (Neurology) [name], [facility] (Inactive) (Pharmacist) Roni, [facility]   Clinic Day:  02/29/2024  Referring physician: [name], FNP  ASSESSMENT & PLAN:   Assessment & Plan: Stage 4 malignant neoplasm of lung [facility]) # Small Cell Lung Cancer, Extensive Stage 07/05/2020: CT abdomen for lower abdominal pain. New pulmonary nodular density noted 07/06/2020: CT chest showed 2.2 cm macrolobulated right lower lobe pulmonary nodule (favored) versus pathologically enlarged infrahilar lymph node 10/13/2020: PET CT scan performed, findings show 2 cm right lower lobe lung mass is hypermetabolic and consistent with primary lung neoplasm. Additionally found hypermetabolic 17 mm soft tissue lesion between the descending duodenum and the pancreatic head  10/19/2020: EGD to biopsy hypermetabolic lymph node. Biopsy results show small cell lung cancer 10/26/2020: establish care with Dr. Federico  11/13/2020: Cycle 1 Day 1 of Carbo/Etop/Atezolizumab  12/04/2020: Cycle 2 Day 1 of Carbo/Etop/Atezolizumab  11/22-11/25/2022: admitted for E. Coli bacteremia/sepsis. Start of Cycle 3 delayed. 01/02/2021: Cycle 3 Day 1 of Carbo/Etop/Atezolizumab  01/23/2021: Cycle 4 Day 1 of Carbo/Etop/Atezolizumab  02/22/2021: Cycle 5 Day 1 of Atezolizumab  Maintenance. Delayed start due to patient's COVID infection.  03/14/2021: Cycle 6 Day 1 of Atezolizumab  Maintenance 04/05/2021: Cycle 7 Day 1 of Atezolizumab  Maintenance 04/25/2021: Cycle 8 Day 1 of  Atezolizumab  Maintenance 05/16/2021: Cycle 9 Day 1 of Atezolizumab  Maintenance 06/06/2021: Cycle 10 Day 1 of Atezolizumab  Maintenance 06/27/2021:  Cycle 11 Day 1 of Atezolizumab  Maintenance 07/18/2021: Cycle 12 Day 1 of Atezolizumab  Maintenance 08/08/2021: Cycle 13 Day 1 of Atezolizumab  Maintenance 08/29/2021: Cycle 14 Day 1 of Atezolizumab  Maintenance 09/19/2021: Cycle 15 Day 1 of Atezolizumab  Maintenance 10/10/2021: Cycle 16 Day 1 of Atezolizumab  Maintenance 10/31/2021: Cycle 17 Day 1 of Atezolizumab  Maintenance 11/21/2021: Cycle 18 Day 1 of Atezolizumab  Maintenance 12/12/2021: Cycle 19 Day 1 of Atezolizumab  Maintenance 01/02/2022: Cycle 20 Day 1 of Atezolizumab  Maintenance 01/23/2022: Cycle 21 Day 1 of Atezolizumab  Maintenance 02/13/2022: treatment HELD due to diarrhea.  03/06/2022: Cycle 22 Day 1 of Atezolizumab  Maintenance 03/27/2022: Cycle 23 Day 1 of Atezolizumab  Maintenance 04/17/2022: Cycle 24 Day 1 of Atezolizumab  Maintenance 05/08/2022:  Cycle 25 Day 1 of Atezolizumab  Maintenance (Held due to patient preference for treatment holiday) 05/29/2022: Cycle 25 Day 1 of Atezolizumab  Maintenance  06/19/2022: Cycle 26 Day 1 of Atezolizumab  Maintenance  07/12/2022: Cycle 27 Day 1 of Atezolizumab  Maintenance 07/31/2022: Cycle 28 Day 1 of Atezolizumab  Maintenance HELD per patient request for fatigue.  08/22/2022: Cycle 28 Day 1 of Atezolizumab  Maintenance 09/11/2022: Cycle 29 Day 1 of Atezolizumab  Maintenance 10/03/2022: Cycle 30 Day 1 of Atezolizumab  Maintenance 10/23/2022: Cycle 31 Day 1 of Atezolizumab  Maintenance 11/13/2022: Cycle 32 Day 1 of Atezolizumab  Maintenance 12/24/2022: Cycle 33 Day 1 of Atezolizumab  Maintenance 01/15/2023: Cycle 34 Day 1 of Atezolizumab  Maintenance 02/05/2023: Cycle 35 Day 1 of Atezolizumab  Maintenance 02/26/2023: Cycle 36 Day 1 of Atezolizumab  Maintenance 04/28/2023: Cycle 37 Day 1 of Atezolizumab  Maintenance 04/30/2023: Cycle 38 Day 1 of Atezolizumab  Maintenance 05/21/2023: Cycle  39 Day 1 of Atezolizumab  Maintenance 06/11/2023: Cycle 40 Day 1 of Atezolizumab  Maintenance 07/28/2023: Cycle 41 Day 1 of Atezolizumab  Maintenance 08/18/2023: Cycle 42 Day 1 of Atezolizumab   Maintenance 09/08/2023: Cycle 43 Day 1 of Atezolizumab  Maintenance 09/30/2023:  Cycle 44 Day 1 of Atezolizumab  Maintenance 10/21/2023: Cycle 45 Day 1 of Atezolizumab  Maintenance 11/11/2023: Cycle 46 Day 1 of Atezolizumab  Maintenance 12/03/2023: Cycle 47 Day 1 of Atezolizumab  Maintenance 12/24/2023: Cycle 48 Day 1 of Atezolizumab  Maintenance 01/14/2024: Cycle 49 Day 1 of Atezolizumab  Maintenance 02/04/2023: Cycle 50 Day 1 of Atezolizumab  Maintenance    The patient understands the plans discussed today and is in agreement with them.  She knows to contact our office if she develops concerns prior to her next appointment.  I provided *** minutes of face-to-face time during this encounter and > 50% was spent counseling as documented under my assessment and plan.    [name], NP  [facility] - A DEPT OF [name] [address] [place] [address] Dept: [phone] Dept Fax: [phone]   No orders of the defined types were placed in this encounter.     CHIEF COMPLAINT:  CC: Stage IV small cell lung cancer  Current Treatment: Maintenance atezolizumab   INTERVAL HISTORY:  [name] is here today for repeat clinical assessment.  She was last seen by Dr. Federico on 02/25/2024.  Most recent CT CAP on 01/17/2024 showed no evidence of recurrence. denies fevers or chills. She denies pain. Her appetite is good. Her weight {Weight change:10426}.  I have reviewed the past medical history, past surgical history, social history and family history with the patient and they are unchanged from previous note.  ALLERGIES:  is allergic to nisoldipine, metformin  and related, nsaids, pregabalin, wellbutrin  [bupropion ], aleve [naproxen sodium],  penicillins, and sulfonamide derivatives.  MEDICATIONS:  Current Outpatient Medications  Medication Sig Dispense Refill   albuterol  (PROAIR  HFA) 108 (90 Base) MCG/ACT inhaler 2 puffs every 4 hours as needed only  if your can't catch your breath 18 g 5   ALPRAZolam  (XANAX ) 1 MG tablet TAKE 1 TABLET BY MOUTH THREE TIMES DAILY AS NEEDED FOR ANXIETY 90 tablet 2   ARIPiprazole  (ABILIFY ) 5 MG tablet Take 1 tablet (5 mg total) by mouth daily. 30 tablet 5   atorvastatin  (LIPITOR) 20 MG tablet TAKE 1 TABLET BY MOUTH AT BEDTIME 90 tablet 1   benzonatate  (TESSALON ) 100 MG capsule Take 1 capsule (100 mg total) by mouth 3 (three) times daily. 90 capsule 1   buprenorphine  (BUTRANS ) 7.5 MCG/HR Place 1 patch onto the skin once a week. For nerve and chronic pain- with Norco- to get pain under better control due to Lung Ca with mets- - G89.3- can fill now 4 patch 5   calcipotriene  (DOVONOX) 0.005 % cream Apply to affected area bid when on break from steroid cream 120 g 3   cyclobenzaprine  (FLEXERIL ) 10 MG tablet Take 10 mg by mouth 3 (three) times daily as needed.     dicyclomine  (BENTYL ) 10 MG capsule TAKE 1 CAPSULE BY MOUTH EVERY 8 HOURS AS NEEDED FOR SPASMS 60 capsule 1   diphenoxylate -atropine  (LOMOTIL ) 2.5-0.025 MG tablet TAKE 1 TABLET BY MOUTH FOUR TIMES DAILY AS NEEDED FOR diarrhea OR loose stools 60 tablet 0   DULoxetine  (CYMBALTA ) 60 MG capsule TAKE 2 CAPSULES BY MOUTH EVERY DAY 180 capsule 1   ferrous sulfate  325 (65 FE) MG EC tablet TAKE 1 TABLET BY MOUTH DAILY WITH BREAKFAST 30 tablet 3   fluticasone  (FLONASE ) 50 MCG/ACT nasal spray INSTILL 2 SPRAYS IN EACH NOSTRIL EVERY DAY 16 g 6   furosemide  (LASIX )  20 MG tablet TAKE 1 TABLET BY MOUTH TWICE DAILY. If swelling improves can hold medication. 90 tablet 0   gabapentin (NEURONTIN) 100 MG capsule Take 100 mg by mouth 2 (two) times daily.     HYDROcodone -acetaminophen  (NORCO) 10-325 MG tablet Take 1 tablet by mouth every 4 (four) hours as needed. Along with  Butrans - G89.3- for breakthrough pain fill now 180 tablet 0   HYDROcodone -acetaminophen  (NORCO) 10-325 MG tablet Take 1 tablet by mouth every 4 (four) hours as needed. Along with Butrans - G89.3- for breakthrough pain fill in 28 days 180 tablet 0   hydrOXYzine  (ATARAX ) 25 MG tablet Take 1 tablet (25 mg total) by mouth every 6 (six) hours as needed. 90 tablet 2   levothyroxine  (SYNTHROID ) 112 MCG tablet Take 1 tablet (112 mcg total) by mouth daily before breakfast. For hypothyroidism- reduced dose 90 tablet 1   lidocaine -prilocaine  (EMLA ) cream Apply 1 Application topically as needed. 30 g 2   loratadine  (CLARITIN ) 10 MG tablet Take 10 mg by mouth daily.     meclizine  (ANTIVERT ) 12.5 MG tablet TAKE 1 TABLET BY MOUTH TWICE DAILY 60 tablet 2   meloxicam  (MOBIC ) 15 MG tablet TAKE 1 TABLET BY MOUTH EVERY DAY 30 tablet 0   NARCAN  4 MG/0.1ML LIQD nasal spray kit SMARTSIG:Both Nares     nystatin  (MYCOSTATIN ) 100000 UNIT/ML suspension      OLANZapine  (ZYPREXA ) 10 MG tablet Take 1 tablet (10 mg total) by mouth at bedtime. 30 tablet 2   omeprazole  (PRILOSEC) 40 MG capsule Take 1 capsule (40 mg total) by mouth 2 (two) times daily. 180 capsule 3   ondansetron  (ZOFRAN ) 8 MG tablet Take 1 tablet (8 mg total) by mouth 2 (two) times daily. 60 tablet 1   permethrin  (ELIMITE ) 5 % cream      potassium chloride  SA (KLOR-CON  M) 20 MEQ tablet TAKE 1 TABLET BY MOUTH TWICE DAILY 60 tablet 2   prochlorperazine  (COMPAZINE ) 10 MG tablet Take 1 tablet (10 mg total) by mouth every 6 (six) hours as needed for nausea or vomiting. 30 tablet 0   Rimegepant Sulfate (NURTEC) 75 MG TBDP TAKE 1 TABLET BY MOUTH DAILY AS NEEDED FOR MIGRAINE, take WITHIN 15 MINUTES of initial symptoms 8 tablet 1   topiramate  (TOPAMAX ) 100 MG tablet Take 1 tablet (100 mg total) by mouth at bedtime. 90 tablet 1   triamcinolone  ointment (KENALOG ) 0.5 % Apply 1 Application topically 3 (three) times daily. Apply thin-layer on affected areas, no more than 2  weeks consistently. 60 g 2   triamterene -hydrochlorothiazide  (MAXZIDE -25) 37.5-25 MG tablet Take 1 tablet by mouth daily.     umeclidinium-vilanterol (ANORO ELLIPTA ) 62.5-25 MCG/ACT AEPB Inhale 1 puff into the lungs daily. 60 each 11   No current facility-administered medications for this visit.    HISTORY OF PRESENT ILLNESS:   Oncology History  Stage 4 malignant neoplasm of lung (HCC)  10/27/2020 Initial Diagnosis   Small cell lung cancer (HCC)   10/27/2020 Cancer Staging   Staging form: Lung, AJCC 8th Edition - Clinical: Stage IVA (cT1c, cN0, cM1b) - Signed by Federico Norleen ONEIDA MADISON, MD on 10/27/2020 Stage prefix: Initial diagnosis   11/13/2020 - 09/19/2021 Chemotherapy   Patient is on Treatment Plan : LUNG SCLC Carboplatin  + Etoposide  + Atezolizumab  Induction q21d / Atezolizumab  Maintenance q21d     11/13/2020 -  Chemotherapy   Patient is on Treatment Plan : LUNG SCLC Carboplatin  + Etoposide  + Atezolizumab  Induction q21d x 4 cycles / Atezolizumab  Maintenance q21d  REVIEW OF SYSTEMS:   Constitutional: Denies fevers, chills or abnormal weight loss Eyes: Denies blurriness of vision Ears, nose, mouth, throat, and face: Denies mucositis or sore throat Respiratory: Denies cough, dyspnea or wheezes Cardiovascular: Denies palpitation, chest discomfort or lower extremity swelling Gastrointestinal:  Denies nausea, heartburn or change in bowel habits Skin: Denies abnormal skin rashes Lymphatics: Denies new lymphadenopathy or easy bruising Neurological:Denies numbness, tingling or new weaknesses Behavioral/Psych: Mood is stable, no new changes  All other systems were reviewed with the patient and are negative.   VITALS:  There were no vitals taken for this visit.  Wt Readings from Last 3 Encounters:  02/06/24 194 lb (88 kg)  02/04/24 187 lb 8 oz (85 kg)  01/14/24 188 lb 11.2 oz (85.6 kg)    There is no height or weight on file to calculate BMI.  Performance status (ECOG):  {CHL ONC [id number]  PHYSICAL EXAM:   GENERAL:alert, no distress and comfortable SKIN: skin color, texture, turgor are normal, no rashes or significant lesions EYES: normal, Conjunctiva are pink and non-injected, sclera clear OROPHARYNX:no exudate, no erythema and lips, buccal mucosa, and tongue normal  NECK: supple, thyroid  normal size, non-tender, without nodularity LYMPH:  no palpable lymphadenopathy in the cervical, axillary or inguinal LUNGS: clear to auscultation and percussion with normal breathing effort HEART: regular rate & rhythm and no murmurs and no lower extremity edema ABDOMEN:abdomen soft, non-tender and normal bowel sounds Musculoskeletal:no cyanosis of digits and no clubbing  NEURO: alert & oriented x 3 with fluent speech, no focal motor/sensory deficits  LABORATORY DATA:  I have reviewed the data as listed    Component Value Date/Time   NA 140 02/04/2024 1103   NA 134 09/13/2020 1416   NA 144 06/30/2014 1448   K 3.8 02/04/2024 1103   K 3.6 06/30/2014 1448   CL 105 02/04/2024 1103   CO2 26 02/04/2024 1103   CO2 25 06/30/2014 1448   GLUCOSE 96 02/04/2024 1103   GLUCOSE 86 06/30/2014 1448   BUN 14 02/04/2024 1103   BUN 8 09/13/2020 1416   BUN 19.0 06/30/2014 1448   CREATININE 1.25 (H) 02/04/2024 1103   CREATININE 1.19 (H) 03/12/2019 1354   CREATININE 1.0 06/30/2014 1448   CALCIUM  8.6 (L) 02/04/2024 1103   CALCIUM  8.2 (L) 06/30/2014 1448   PROT 6.2 (L) 02/04/2024 1103   PROT 7.0 06/30/2014 1448   ALBUMIN  3.5 02/04/2024 1103   ALBUMIN  3.4 (L) 06/30/2014 1448   AST 20 02/04/2024 1103   AST 16 06/30/2014 1448   ALT 7 02/04/2024 1103   ALT 7 06/30/2014 1448   ALKPHOS 94 02/04/2024 1103   ALKPHOS 84 06/30/2014 1448   BILITOT 0.4 02/04/2024 1103   BILITOT 0.24 06/30/2014 1448   GFRNONAA 47 (L) 02/04/2024 1103   GFRAA >60 07/07/2019 1404    No results found for: SPEP, UPEP  Lab Results  Component Value Date   WBC 8.0 02/04/2024   NEUTROABS  4.5 02/04/2024   HGB 13.0 02/04/2024   HCT 39.3 02/04/2024   MCV 90.3 02/04/2024   PLT 165 02/04/2024      Chemistry      Component Value Date/Time   NA 140 02/04/2024 1103   NA 134 09/13/2020 1416   NA 144 06/30/2014 1448   K 3.8 02/04/2024 1103   K 3.6 06/30/2014 1448   CL 105 02/04/2024 1103   CO2 26 02/04/2024 1103   CO2 25 06/30/2014 1448   BUN 14 02/04/2024 1103  BUN 8 09/13/2020 1416   BUN 19.0 06/30/2014 1448   CREATININE 1.25 (H) 02/04/2024 1103   CREATININE 1.19 (H) 03/12/2019 1354   CREATININE 1.0 06/30/2014 1448      Component Value Date/Time   CALCIUM  8.6 (L) 02/04/2024 1103   CALCIUM  8.2 (L) 06/30/2014 1448   ALKPHOS 94 02/04/2024 1103   ALKPHOS 84 06/30/2014 1448   AST 20 02/04/2024 1103   AST 16 06/30/2014 1448   ALT 7 02/04/2024 1103   ALT 7 06/30/2014 1448   BILITOT 0.4 02/04/2024 1103   BILITOT 0.24 06/30/2014 1448       RADIOGRAPHIC STUDIES: I have personally reviewed the radiological images as listed and agreed with the findings in the report. CT HEAD WO CONTRAST ( ) Result Date: 02/11/2024 EXAM: CT HEAD WITHOUT CONTRAST 02/11/2024 10:28:47 AM TECHNIQUE: CT of the head was performed without the administration of intravenous contrast. Automated exposure control, iterative reconstruction, and/or weight based adjustment of the mA/kV was utilized to reduce the radiation dose to as low as reasonably achievable. COMPARISON: CT of the head dated 07/07/2019. CLINICAL HISTORY: The [age], aged [age] experienced a significant fall resulting in head trauma and focal neurological findings. There is a probable concussion, and evaluation is being performed to rule out subdural hematoma and other complications. FINDINGS: BRAIN AND VENTRICLES: No acute hemorrhage. No evidence of acute infarct. No hydrocephalus. No extra-axial collection. No mass effect or midline shift. Partially empty sella. ORBITS: Bilateral lens replacement. SINUSES: No acute abnormality. SOFT  TISSUES AND SKULL: No acute soft tissue abnormality. No skull fracture. IMPRESSION: 1. No acute intracranial abnormality related to the reported head trauma. No acute intracranial hemorrhage or calvarial fracture identified. 2. Partially empty sella and bilateral lens replacements. Electronically signed by: Evalene Coho MD 02/11/2024 11:08 AM EST RP Workstation: [id number]   "

## 2024-03-01 ENCOUNTER — Inpatient Hospital Stay: Admitting: Nurse Practitioner

## 2024-03-01 ENCOUNTER — Inpatient Hospital Stay

## 2024-03-01 ENCOUNTER — Inpatient Hospital Stay: Attending: Hematology and Oncology

## 2024-03-01 ENCOUNTER — Encounter: Payer: Self-pay | Admitting: Hematology and Oncology

## 2024-03-05 ENCOUNTER — Inpatient Hospital Stay: Attending: Hematology and Oncology

## 2024-03-05 ENCOUNTER — Other Ambulatory Visit: Payer: Self-pay | Admitting: Hematology and Oncology

## 2024-03-05 ENCOUNTER — Inpatient Hospital Stay

## 2024-03-05 ENCOUNTER — Inpatient Hospital Stay: Admitting: Nurse Practitioner

## 2024-03-05 VITALS — BP 122/71 | HR 90 | Temp 98.2°F | Resp 17 | Wt 199.7 lb

## 2024-03-05 DIAGNOSIS — C3490 Malignant neoplasm of unspecified part of unspecified bronchus or lung: Secondary | ICD-10-CM

## 2024-03-05 DIAGNOSIS — C3431 Malignant neoplasm of lower lobe, right bronchus or lung: Secondary | ICD-10-CM

## 2024-03-05 LAB — CBC WITH DIFFERENTIAL (CANCER CENTER ONLY)
Abs Immature Granulocytes: 0.04 10*3/uL (ref 0.00–0.07)
Basophils Absolute: 0.1 10*3/uL (ref 0.0–0.1)
Basophils Relative: 1 %
Eosinophils Absolute: 0.6 10*3/uL — ABNORMAL HIGH (ref 0.0–0.5)
Eosinophils Relative: 9 %
HCT: 40.6 % (ref 36.0–46.0)
Hemoglobin: 13.4 g/dL (ref 12.0–15.0)
Immature Granulocytes: 1 %
Lymphocytes Relative: 33 %
Lymphs Abs: 2.2 10*3/uL (ref 0.7–4.0)
MCH: 30.1 pg (ref 26.0–34.0)
MCHC: 33 g/dL (ref 30.0–36.0)
MCV: 91.2 fL (ref 80.0–100.0)
Monocytes Absolute: 0.4 10*3/uL (ref 0.1–1.0)
Monocytes Relative: 6 %
Neutro Abs: 3.4 10*3/uL (ref 1.7–7.7)
Neutrophils Relative %: 50 %
Platelet Count: 179 10*3/uL (ref 150–400)
RBC: 4.45 MIL/uL (ref 3.87–5.11)
RDW: 16 % — ABNORMAL HIGH (ref 11.5–15.5)
WBC Count: 6.8 10*3/uL (ref 4.0–10.5)
nRBC: 0 % (ref 0.0–0.2)

## 2024-03-05 LAB — CMP (CANCER CENTER ONLY)
ALT: 8 U/L (ref 0–44)
AST: 16 U/L (ref 15–41)
Albumin: 3.6 g/dL (ref 3.5–5.0)
Alkaline Phosphatase: 96 U/L (ref 38–126)
Anion gap: 10 (ref 5–15)
BUN: 11 mg/dL (ref 8–23)
CO2: 23 mmol/L (ref 22–32)
Calcium: 8.3 mg/dL — ABNORMAL LOW (ref 8.9–10.3)
Chloride: 108 mmol/L (ref 98–111)
Creatinine: 1.2 mg/dL — ABNORMAL HIGH (ref 0.44–1.00)
GFR, Estimated: 49 mL/min — ABNORMAL LOW
Glucose, Bld: 90 mg/dL (ref 70–99)
Potassium: 3.9 mmol/L (ref 3.5–5.1)
Sodium: 141 mmol/L (ref 135–145)
Total Bilirubin: 0.2 mg/dL (ref 0.0–1.2)
Total Protein: 6 g/dL — ABNORMAL LOW (ref 6.5–8.1)

## 2024-03-05 LAB — TSH: TSH: 5.04 u[IU]/mL — ABNORMAL HIGH (ref 0.350–4.500)

## 2024-03-05 MED ORDER — ONDANSETRON HCL 4 MG/2ML IJ SOLN
8.0000 mg | Freq: Once | INTRAMUSCULAR | Status: AC
  Administered 2024-03-05: 8 mg via INTRAVENOUS
  Filled 2024-03-05: qty 4

## 2024-03-05 MED ORDER — SODIUM CHLORIDE 0.9 % IV SOLN
1200.0000 mg | Freq: Once | INTRAVENOUS | Status: AC
  Administered 2024-03-05: 1200 mg via INTRAVENOUS
  Filled 2024-03-05: qty 20

## 2024-03-05 MED ORDER — SODIUM CHLORIDE 0.9 % IV SOLN: Freq: Once | INTRAVENOUS | Status: AC

## 2024-03-05 NOTE — Patient Instructions (Signed)
 [facility] - A DEPT OF [facility]  Discharge Instructions: Thank you for choosing [facility] to provide your oncology and hematology care.   If you have a lab appointment with the Cancer Center, please go directly to the Cancer Center and check in at the registration area.   Wear comfortable clothing and clothing appropriate for easy access to any Portacath or PICC line.   We strive to give you quality time with your provider. You may need to reschedule your appointment if you arrive late (15 or more minutes).  Arriving late affects you and other patients whose appointments are after yours.  Also, if you miss three or more appointments without notifying the office, you may be dismissed from the clinic at the provider's discretion.      For prescription refill requests, have your pharmacy contact our office and allow 72 hours for refills to be completed.    Today you received the following chemotherapy and/or immunotherapy agents: Atezolizumab  (Tecentriq )    To help prevent nausea and vomiting after your treatment, we encourage you to take your nausea medication as directed.  BELOW ARE SYMPTOMS THAT SHOULD BE REPORTED IMMEDIATELY: *FEVER GREATER THAN 100.4 F (38 C) OR HIGHER *CHILLS OR SWEATING *NAUSEA AND VOMITING THAT IS NOT CONTROLLED WITH YOUR NAUSEA MEDICATION *UNUSUAL SHORTNESS OF BREATH *UNUSUAL BRUISING OR BLEEDING *URINARY PROBLEMS (pain or burning when urinating, or frequent urination) *BOWEL PROBLEMS (unusual diarrhea, constipation, pain near the anus) TENDERNESS IN MOUTH AND THROAT WITH OR WITHOUT PRESENCE OF ULCERS (sore throat, sores in mouth, or a toothache) UNUSUAL RASH, SWELLING OR PAIN  UNUSUAL VAGINAL DISCHARGE OR ITCHING   Items with * indicate a potential emergency and should be followed up as soon as possible or go to the Emergency Department if any problems should occur.  Please show the CHEMOTHERAPY ALERT CARD or  IMMUNOTHERAPY ALERT CARD at check-in to the Emergency Department and triage nurse.  Should you have questions after your visit or need to cancel or reschedule your appointment, please contact [facility] - A DEPT OF [facility]  Dept: [phone]  and follow the prompts.  Office hours are 8:00 a.m. to 4:30 p.m. Monday - Friday. Please note that voicemails left after 4:00 p.m. may not be returned until the following business day.  We are closed weekends and major holidays. You have access to a nurse at all times for urgent questions. Please call the main number to the clinic Dept: [phone] and follow the prompts.   For any non-urgent questions, you may also contact your provider using MyChart. We now offer e-Visits for anyone 53 and older to request care online for non-urgent symptoms. For details visit [email or web address].   Also download the MyChart app! Go to the app store, search MyChart, open the app, select [facility], and log in with your MyChart username and password.

## 2024-03-05 NOTE — Assessment & Plan Note (Signed)
#   Small Cell Lung Cancer, Extensive Stage 07/05/2020: CT abdomen for lower abdominal pain. New pulmonary nodular density noted 07/06/2020: CT chest showed 2.2 cm macrolobulated right lower lobe pulmonary nodule (favored) versus pathologically enlarged infrahilar lymph node 10/13/2020: PET CT scan performed, findings show 2 cm right lower lobe lung mass is hypermetabolic and consistent with primary lung neoplasm. Additionally found hypermetabolic 17 mm soft tissue lesion between the descending duodenum and the pancreatic head  10/19/2020: EGD to biopsy hypermetabolic lymph node. Biopsy results show small cell lung cancer 10/26/2020: establish care with Dr. Federico  11/13/2020: Cycle 1 Day 1 of Carbo/Etop/Atezolizumab  12/04/2020: Cycle 2 Day 1 of Carbo/Etop/Atezolizumab  11/22-11/25/2022: admitted for E. Coli bacteremia/sepsis. Start of Cycle 3 delayed. 01/02/2021: Cycle 3 Day 1 of Carbo/Etop/Atezolizumab  01/23/2021: Cycle 4 Day 1 of Carbo/Etop/Atezolizumab  02/22/2021: Cycle 5 Day 1 of Atezolizumab  Maintenance. Delayed start due to patient's COVID infection.  03/14/2021: Cycle 6 Day 1 of Atezolizumab  Maintenance 04/05/2021: Cycle 7 Day 1 of Atezolizumab  Maintenance 04/25/2021: Cycle 8 Day 1 of Atezolizumab  Maintenance 05/16/2021: Cycle 9 Day 1 of Atezolizumab  Maintenance 06/06/2021: Cycle 10 Day 1 of Atezolizumab  Maintenance 06/27/2021:  Cycle 11 Day 1 of Atezolizumab  Maintenance 07/18/2021: Cycle 12 Day 1 of Atezolizumab  Maintenance 08/08/2021: Cycle 13 Day 1 of Atezolizumab  Maintenance 08/29/2021: Cycle 14 Day 1 of Atezolizumab  Maintenance 09/19/2021: Cycle 15 Day 1 of Atezolizumab  Maintenance 10/10/2021: Cycle 16 Day 1 of Atezolizumab  Maintenance 10/31/2021: Cycle 17 Day 1 of Atezolizumab  Maintenance 11/21/2021: Cycle 18 Day 1 of Atezolizumab  Maintenance 12/12/2021: Cycle 19 Day 1 of Atezolizumab  Maintenance 01/02/2022: Cycle 20 Day 1 of Atezolizumab  Maintenance 01/23/2022: Cycle 21 Day 1 of Atezolizumab   Maintenance 02/13/2022: treatment HELD due to diarrhea.  03/06/2022: Cycle 22 Day 1 of Atezolizumab  Maintenance 03/27/2022: Cycle 23 Day 1 of Atezolizumab  Maintenance 04/17/2022: Cycle 24 Day 1 of Atezolizumab  Maintenance 05/08/2022:  Cycle 25 Day 1 of Atezolizumab  Maintenance (Held due to patient preference for treatment holiday) 05/29/2022: Cycle 25 Day 1 of Atezolizumab  Maintenance  06/19/2022: Cycle 26 Day 1 of Atezolizumab  Maintenance  07/12/2022: Cycle 27 Day 1 of Atezolizumab  Maintenance 07/31/2022: Cycle 28 Day 1 of Atezolizumab  Maintenance HELD per patient request for fatigue.  08/22/2022: Cycle 28 Day 1 of Atezolizumab  Maintenance 09/11/2022: Cycle 29 Day 1 of Atezolizumab  Maintenance 10/03/2022: Cycle 30 Day 1 of Atezolizumab  Maintenance 10/23/2022: Cycle 31 Day 1 of Atezolizumab  Maintenance 11/13/2022: Cycle 32 Day 1 of Atezolizumab  Maintenance 12/24/2022: Cycle 33 Day 1 of Atezolizumab  Maintenance 01/15/2023: Cycle 34 Day 1 of Atezolizumab  Maintenance 02/05/2023: Cycle 35 Day 1 of Atezolizumab  Maintenance 02/26/2023: Cycle 36 Day 1 of Atezolizumab  Maintenance 04/28/2023: Cycle 37 Day 1 of Atezolizumab  Maintenance 04/30/2023: Cycle 38 Day 1 of Atezolizumab  Maintenance 05/21/2023: Cycle 39 Day 1 of Atezolizumab  Maintenance 06/11/2023: Cycle 40 Day 1 of Atezolizumab  Maintenance 07/28/2023: Cycle 41 Day 1 of Atezolizumab  Maintenance 08/18/2023: Cycle 42 Day 1 of Atezolizumab  Maintenance 09/08/2023: Cycle 43 Day 1 of Atezolizumab  Maintenance 09/30/2023:  Cycle 44 Day 1 of Atezolizumab  Maintenance 10/21/2023: Cycle 45 Day 1 of Atezolizumab  Maintenance 11/11/2023: Cycle 46 Day 1 of Atezolizumab  Maintenance 12/03/2023: Cycle 47 Day 1 of Atezolizumab  Maintenance 12/24/2023: Cycle 48 Day 1 of Atezolizumab  Maintenance 01/14/2024: Cycle 49 Day 1 of Atezolizumab  Maintenance 02/04/2023: Cycle 50 Day 1 of Atezolizumab  Maintenance

## 2024-03-05 NOTE — Progress Notes (Unsigned)
 " Patient Care Team: Kendra Jenkins Jansky, FNP as PCP - General (Family Medicine) [name], Kendra Ned, MD as PCP - Cardiology (Cardiology) Kendra Toribio SQUIBB, MD (Inactive) as Attending Physician (Gastroenterology) [name], MD as Consulting Physician (Orthopedic Surgery) Kendra Ingle, MD as Consulting Physician (Orthopedic Surgery) Kendra Carlin POUR, MD (Inactive) as Consulting Physician (Neurology) Kendra Darice HERO, MD as Consulting Physician (Neurology) [name], [facility] (Inactive) (Pharmacist) Kendra, [name]    Clinic Day:  03/05/2024  Referring physician: [name], FNP  ASSESSMENT & PLAN:   Assessment & Plan: No problem-specific Assessment & Plan notes found for this encounter.     The patient understands the plans discussed today and is in agreement with them.  She knows to contact our office if she develops concerns prior to her next appointment.  I provided *** minutes of face-to-face time during this encounter and > 50% was spent counseling as documented under my assessment and plan.    [name], NP  [facility] - A DEPT OF [name] [address] [place] [address] Dept: [phone] Dept Fax: [phone]   No orders of the defined types were placed in this encounter.     CHIEF COMPLAINT:  CC: Stage IV small cell lung cancer  Current Treatment: Maintenance atezolizumab   INTERVAL HISTORY:  [name] is here today for repeat clinical assessment.  She was last seen by Dr. Federico on 02/25/2024.  Most recent CT CAP on 01/17/2024 showed no evidence of recurrence. denies fevers or chills. She denies pain. Her appetite is good. Her weight {Weight change:10426}.  I have reviewed the past medical history, past surgical history, social history and family history with the patient and they are unchanged from previous note.  ALLERGIES:  is  allergic to nisoldipine, metformin  and related, nsaids, pregabalin, wellbutrin  [bupropion ], aleve [naproxen sodium], penicillins, and sulfonamide derivatives.  MEDICATIONS:  Current Outpatient Medications  Medication Sig Dispense Refill   albuterol  (PROAIR  HFA) 108 (90 Base) MCG/ACT inhaler 2 puffs every 4 hours as needed only  if your can't catch your breath 18 g 5   ALPRAZolam  (XANAX ) 1 MG tablet TAKE 1 TABLET BY MOUTH THREE TIMES DAILY AS NEEDED FOR ANXIETY 90 tablet 2   ARIPiprazole  (ABILIFY ) 5 MG tablet Take 1 tablet (5 mg total) by mouth daily. 30 tablet 5   atorvastatin  (LIPITOR) 20 MG tablet TAKE 1 TABLET BY MOUTH AT BEDTIME 90 tablet 1   benzonatate  (TESSALON ) 100 MG capsule Take 1 capsule (100 mg total) by mouth 3 (three) times daily. 90 capsule 1   buprenorphine  (BUTRANS ) 7.5 MCG/HR Place 1 patch onto the skin once a week. For nerve and chronic pain- with Norco- to get pain under better control due to Lung Ca with mets- - G89.3- can fill now 4 patch 5   calcipotriene  (DOVONOX) 0.005 % cream Apply to affected area bid when on break from steroid cream 120 g 3   cyclobenzaprine  (FLEXERIL ) 10 MG tablet Take 10 mg by mouth 3 (three) times daily as needed.     dicyclomine  (BENTYL ) 10 MG capsule TAKE 1 CAPSULE BY MOUTH EVERY 8 HOURS AS NEEDED FOR SPASMS 60 capsule 1   diphenoxylate -atropine  (LOMOTIL ) 2.5-0.025 MG tablet TAKE 1 TABLET BY MOUTH FOUR TIMES DAILY AS NEEDED FOR diarrhea OR loose stools 60 tablet 0   DULoxetine  (CYMBALTA ) 60 MG capsule TAKE 2 CAPSULES BY MOUTH EVERY DAY 180  capsule 1   ferrous sulfate  325 (65 FE) MG EC tablet TAKE 1 TABLET BY MOUTH DAILY WITH BREAKFAST 30 tablet 3   fluticasone  (FLONASE ) 50 MCG/ACT nasal spray INSTILL 2 SPRAYS IN EACH NOSTRIL EVERY DAY 16 g 6   furosemide  (LASIX ) 20 MG tablet TAKE 1 TABLET BY MOUTH TWICE DAILY. If swelling improves can hold medication. 90 tablet 0   gabapentin (NEURONTIN) 100 MG capsule Take 100 mg by mouth 2 (two) times daily.      HYDROcodone -acetaminophen  (NORCO) 10-325 MG tablet Take 1 tablet by mouth every 4 (four) hours as needed. Along with Butrans - G89.3- for breakthrough pain fill now 180 tablet 0   HYDROcodone -acetaminophen  (NORCO) 10-325 MG tablet Take 1 tablet by mouth every 4 (four) hours as needed. Along with Butrans - G89.3- for breakthrough pain fill in 28 days 180 tablet 0   hydrOXYzine  (ATARAX ) 25 MG tablet Take 1 tablet (25 mg total) by mouth every 6 (six) hours as needed. 90 tablet 2   levothyroxine  (SYNTHROID ) 112 MCG tablet Take 1 tablet (112 mcg total) by mouth daily before breakfast. For hypothyroidism- reduced dose 90 tablet 1   lidocaine -prilocaine  (EMLA ) cream Apply 1 Application topically as needed. 30 g 2   loratadine  (CLARITIN ) 10 MG tablet Take 10 mg by mouth daily.     meclizine  (ANTIVERT ) 12.5 MG tablet TAKE 1 TABLET BY MOUTH TWICE DAILY 60 tablet 2   meloxicam  (MOBIC ) 15 MG tablet TAKE 1 TABLET BY MOUTH EVERY DAY 30 tablet 0   NARCAN  4 MG/0.1ML LIQD nasal spray kit SMARTSIG:Both Nares     nystatin  (MYCOSTATIN ) 100000 UNIT/ML suspension      OLANZapine  (ZYPREXA ) 10 MG tablet Take 1 tablet (10 mg total) by mouth at bedtime. 30 tablet 2   omeprazole  (PRILOSEC) 40 MG capsule Take 1 capsule (40 mg total) by mouth 2 (two) times daily. 180 capsule 3   ondansetron  (ZOFRAN ) 8 MG tablet Take 1 tablet (8 mg total) by mouth 2 (two) times daily. 60 tablet 1   permethrin  (ELIMITE ) 5 % cream      potassium chloride  SA (KLOR-CON  M) 20 MEQ tablet TAKE 1 TABLET BY MOUTH TWICE DAILY 60 tablet 2   prochlorperazine  (COMPAZINE ) 10 MG tablet Take 1 tablet (10 mg total) by mouth every 6 (six) hours as needed for nausea or vomiting. 30 tablet 0   Rimegepant Sulfate (NURTEC) 75 MG TBDP TAKE 1 TABLET BY MOUTH DAILY AS NEEDED FOR MIGRAINE, take WITHIN 15 MINUTES of initial symptoms 8 tablet 1   topiramate  (TOPAMAX ) 100 MG tablet Take 1 tablet (100 mg total) by mouth at bedtime. 90 tablet 1   triamcinolone  ointment  (KENALOG ) 0.5 % Apply 1 Application topically 3 (three) times daily. Apply thin-layer on affected areas, no more than 2 weeks consistently. 60 g 2   triamterene -hydrochlorothiazide  (MAXZIDE -25) 37.5-25 MG tablet Take 1 tablet by mouth daily.     umeclidinium-vilanterol (ANORO ELLIPTA ) 62.5-25 MCG/ACT AEPB Inhale 1 puff into the lungs daily. 60 each 11   No current facility-administered medications for this visit.    HISTORY OF PRESENT ILLNESS:   Oncology History  Stage 4 malignant neoplasm of lung (HCC)  10/27/2020 Initial Diagnosis   Small cell lung cancer (HCC)   10/27/2020 Cancer Staging   Staging form: Lung, AJCC 8th Edition - Clinical: Stage IVA (cT1c, cN0, cM1b) - Signed by Federico Norleen ONEIDA MADISON, MD on 10/27/2020 Stage prefix: Initial diagnosis   11/13/2020 - 09/19/2021 Chemotherapy   Patient is on Treatment Plan :  LUNG SCLC Carboplatin  + Etoposide  + Atezolizumab  Induction q21d / Atezolizumab  Maintenance q21d     11/13/2020 -  Chemotherapy   Patient is on Treatment Plan : LUNG SCLC Carboplatin  + Etoposide  + Atezolizumab  Induction q21d x 4 cycles / Atezolizumab  Maintenance q21d         REVIEW OF SYSTEMS:   Constitutional: Denies fevers, chills or abnormal weight loss Eyes: Denies blurriness of vision Ears, nose, mouth, throat, and face: Denies mucositis or sore throat Respiratory: Denies cough, dyspnea or wheezes Cardiovascular: Denies palpitation, chest discomfort or lower extremity swelling Gastrointestinal:  Denies nausea, heartburn or change in bowel habits Skin: Denies abnormal skin rashes Lymphatics: Denies new lymphadenopathy or easy bruising Neurological:Denies numbness, tingling or new weaknesses Behavioral/Psych: Mood is stable, no new changes  All other systems were reviewed with the patient and are negative.   VITALS:  There were no vitals taken for this visit.  Wt Readings from Last 3 Encounters:  02/06/24 194 lb (88 kg)  02/04/24 187 lb 8 oz (85 kg)   01/14/24 188 lb 11.2 oz (85.6 kg)    There is no height or weight on file to calculate BMI.  Performance status (ECOG): {CHL ONC [id number]  PHYSICAL EXAM:   GENERAL:alert, no distress and comfortable SKIN: skin color, texture, turgor are normal, no rashes or significant lesions EYES: normal, Conjunctiva are pink and non-injected, sclera clear OROPHARYNX:no exudate, no erythema and lips, buccal mucosa, and tongue normal  NECK: supple, thyroid  normal size, non-tender, without nodularity LYMPH:  no palpable lymphadenopathy in the cervical, axillary or inguinal LUNGS: clear to auscultation and percussion with normal breathing effort HEART: regular rate & rhythm and no murmurs and no lower extremity edema ABDOMEN:abdomen soft, non-tender and normal bowel sounds Musculoskeletal:no cyanosis of digits and no clubbing  NEURO: alert & oriented x 3 with fluent speech, no focal motor/sensory deficits  LABORATORY DATA:  I have reviewed the data as listed    Component Value Date/Time   NA 140 02/04/2024 1103   NA 134 09/13/2020 1416   NA 144 06/30/2014 1448   K 3.8 02/04/2024 1103   K 3.6 06/30/2014 1448   CL 105 02/04/2024 1103   CO2 26 02/04/2024 1103   CO2 25 06/30/2014 1448   GLUCOSE 96 02/04/2024 1103   GLUCOSE 86 06/30/2014 1448   BUN 14 02/04/2024 1103   BUN 8 09/13/2020 1416   BUN 19.0 06/30/2014 1448   CREATININE 1.25 (H) 02/04/2024 1103   CREATININE 1.19 (H) 03/12/2019 1354   CREATININE 1.0 06/30/2014 1448   CALCIUM  8.6 (L) 02/04/2024 1103   CALCIUM  8.2 (L) 06/30/2014 1448   PROT 6.2 (L) 02/04/2024 1103   PROT 7.0 06/30/2014 1448   ALBUMIN  3.5 02/04/2024 1103   ALBUMIN  3.4 (L) 06/30/2014 1448   AST 20 02/04/2024 1103   AST 16 06/30/2014 1448   ALT 7 02/04/2024 1103   ALT 7 06/30/2014 1448   ALKPHOS 94 02/04/2024 1103   ALKPHOS 84 06/30/2014 1448   BILITOT 0.4 02/04/2024 1103   BILITOT 0.24 06/30/2014 1448   GFRNONAA 47 (L) 02/04/2024 1103   GFRAA >60  07/07/2019 1404    No results found for: SPEP, UPEP  Lab Results  Component Value Date   WBC 8.0 02/04/2024   NEUTROABS 4.5 02/04/2024   HGB 13.0 02/04/2024   HCT 39.3 02/04/2024   MCV 90.3 02/04/2024   PLT 165 02/04/2024      Chemistry      Component Value Date/Time   NA  140 02/04/2024 1103   NA 134 09/13/2020 1416   NA 144 06/30/2014 1448   K 3.8 02/04/2024 1103   K 3.6 06/30/2014 1448   CL 105 02/04/2024 1103   CO2 26 02/04/2024 1103   CO2 25 06/30/2014 1448   BUN 14 02/04/2024 1103   BUN 8 09/13/2020 1416   BUN 19.0 06/30/2014 1448   CREATININE 1.25 (H) 02/04/2024 1103   CREATININE 1.19 (H) 03/12/2019 1354   CREATININE 1.0 06/30/2014 1448      Component Value Date/Time   CALCIUM  8.6 (L) 02/04/2024 1103   CALCIUM  8.2 (L) 06/30/2014 1448   ALKPHOS 94 02/04/2024 1103   ALKPHOS 84 06/30/2014 1448   AST 20 02/04/2024 1103   AST 16 06/30/2014 1448   ALT 7 02/04/2024 1103   ALT 7 06/30/2014 1448   BILITOT 0.4 02/04/2024 1103   BILITOT 0.24 06/30/2014 1448       RADIOGRAPHIC STUDIES: CT HEAD WO CONTRAST ( ) Result Date: 02/11/2024 EXAM: CT HEAD WITHOUT CONTRAST 02/11/2024 10:28:47 AM TECHNIQUE: CT of the head was performed without the administration of intravenous contrast. Automated exposure control, iterative reconstruction, and/or weight based adjustment of the mA/kV was utilized to reduce the radiation dose to as low as reasonably achievable. COMPARISON: CT of the head dated 07/07/2019. CLINICAL HISTORY: The [age], aged [age] experienced a significant fall resulting in head trauma and focal neurological findings. There is a probable concussion, and evaluation is being performed to rule out subdural hematoma and other complications. FINDINGS: BRAIN AND VENTRICLES: No acute hemorrhage. No evidence of acute infarct. No hydrocephalus. No extra-axial collection. No mass effect or midline shift. Partially empty sella. ORBITS: Bilateral lens replacement. SINUSES:  No acute abnormality. SOFT TISSUES AND SKULL: No acute soft tissue abnormality. No skull fracture. IMPRESSION: 1. No acute intracranial abnormality related to the reported head trauma. No acute intracranial hemorrhage or calvarial fracture identified. 2. Partially empty sella and bilateral lens replacements. Electronically signed by: Evalene Coho MD 02/11/2024 11:08 AM EST RP Workstation: [id number]   "

## 2024-03-17 ENCOUNTER — Inpatient Hospital Stay: Admitting: Physician Assistant

## 2024-03-17 ENCOUNTER — Inpatient Hospital Stay: Attending: Hematology and Oncology

## 2024-03-17 ENCOUNTER — Inpatient Hospital Stay

## 2024-03-25 ENCOUNTER — Inpatient Hospital Stay

## 2024-03-25 ENCOUNTER — Inpatient Hospital Stay: Admitting: Dietician

## 2024-03-25 ENCOUNTER — Inpatient Hospital Stay: Admitting: Hematology and Oncology

## 2024-03-30 ENCOUNTER — Ambulatory Visit (HOSPITAL_BASED_OUTPATIENT_CLINIC_OR_DEPARTMENT_OTHER): Admitting: Pulmonary Disease

## 2024-04-01 ENCOUNTER — Ambulatory Visit (HOSPITAL_BASED_OUTPATIENT_CLINIC_OR_DEPARTMENT_OTHER): Admitting: Pulmonary Disease

## 2024-04-05 ENCOUNTER — Encounter: Admitting: Physical Medicine and Rehabilitation

## 2024-04-07 ENCOUNTER — Inpatient Hospital Stay

## 2024-04-07 ENCOUNTER — Inpatient Hospital Stay: Admitting: Physician Assistant

## 2024-04-07 ENCOUNTER — Inpatient Hospital Stay: Attending: Hematology and Oncology

## 2024-04-15 ENCOUNTER — Inpatient Hospital Stay

## 2024-04-15 ENCOUNTER — Inpatient Hospital Stay: Admitting: Hematology and Oncology

## 2024-04-15 ENCOUNTER — Inpatient Hospital Stay: Attending: Hematology and Oncology

## 2024-04-19 ENCOUNTER — Encounter: Admitting: Physical Medicine and Rehabilitation

## 2024-04-23 ENCOUNTER — Encounter: Payer: Self-pay | Admitting: Physical Medicine and Rehabilitation

## 2024-05-25 ENCOUNTER — Ambulatory Visit

## 2024-06-04 ENCOUNTER — Encounter: Admitting: Physical Medicine and Rehabilitation
